# Patient Record
Sex: Male | Born: 1940 | Race: White | Hispanic: No | Marital: Married | Smoking: Former smoker
Health system: Southern US, Community
[De-identification: ages and names within clinical notes are randomized; demographics above are authoritative.]

## PROBLEM LIST (undated history)

## (undated) DIAGNOSIS — I639 Cerebral infarction, unspecified: Secondary | ICD-10-CM

## (undated) DIAGNOSIS — M545 Low back pain, unspecified: Secondary | ICD-10-CM

## (undated) DIAGNOSIS — R112 Nausea with vomiting, unspecified: Secondary | ICD-10-CM

## (undated) DIAGNOSIS — G4733 Obstructive sleep apnea (adult) (pediatric): Secondary | ICD-10-CM

## (undated) DIAGNOSIS — M1990 Unspecified osteoarthritis, unspecified site: Secondary | ICD-10-CM

## (undated) DIAGNOSIS — Z9889 Other specified postprocedural states: Secondary | ICD-10-CM

## (undated) DIAGNOSIS — Z950 Presence of cardiac pacemaker: Secondary | ICD-10-CM

## (undated) DIAGNOSIS — Z9289 Personal history of other medical treatment: Secondary | ICD-10-CM

## (undated) DIAGNOSIS — I5032 Chronic diastolic (congestive) heart failure: Secondary | ICD-10-CM

## (undated) DIAGNOSIS — F0390 Unspecified dementia without behavioral disturbance: Secondary | ICD-10-CM

## (undated) DIAGNOSIS — R0602 Shortness of breath: Secondary | ICD-10-CM

## (undated) DIAGNOSIS — G20A1 Parkinson's disease without dyskinesia, without mention of fluctuations: Secondary | ICD-10-CM

## (undated) DIAGNOSIS — E785 Hyperlipidemia, unspecified: Secondary | ICD-10-CM

## (undated) DIAGNOSIS — G8929 Other chronic pain: Secondary | ICD-10-CM

## (undated) DIAGNOSIS — I10 Essential (primary) hypertension: Secondary | ICD-10-CM

## (undated) DIAGNOSIS — Z8739 Personal history of other diseases of the musculoskeletal system and connective tissue: Secondary | ICD-10-CM

## (undated) DIAGNOSIS — E119 Type 2 diabetes mellitus without complications: Secondary | ICD-10-CM

## (undated) DIAGNOSIS — R011 Cardiac murmur, unspecified: Secondary | ICD-10-CM

## (undated) DIAGNOSIS — G4752 REM sleep behavior disorder: Secondary | ICD-10-CM

## (undated) DIAGNOSIS — Z9989 Dependence on other enabling machines and devices: Secondary | ICD-10-CM

## (undated) DIAGNOSIS — I2510 Atherosclerotic heart disease of native coronary artery without angina pectoris: Secondary | ICD-10-CM

## (undated) HISTORY — PX: FRACTURE SURGERY: SHX138

## (undated) HISTORY — PX: TONSILLECTOMY: SUR1361

## (undated) HISTORY — PX: CARDIAC CATHETERIZATION: SHX172

## (undated) HISTORY — DX: Essential (primary) hypertension: I10

## (undated) HISTORY — DX: Cerebral infarction, unspecified: I63.9

## (undated) HISTORY — PX: NASAL FRACTURE SURGERY: SHX718

## (undated) HISTORY — PX: HIP FRACTURE SURGERY: SHX118

## (undated) HISTORY — DX: Hyperlipidemia, unspecified: E78.5

## (undated) HISTORY — DX: Atherosclerotic heart disease of native coronary artery without angina pectoris: I25.10

---

## 1997-04-10 ENCOUNTER — Encounter: Payer: Self-pay | Admitting: Internal Medicine

## 1997-06-30 ENCOUNTER — Encounter: Payer: Self-pay | Admitting: Internal Medicine

## 1998-05-12 ENCOUNTER — Ambulatory Visit (HOSPITAL_COMMUNITY): Admission: RE | Admit: 1998-05-12 | Discharge: 1998-05-12 | Payer: Self-pay | Admitting: Interventional Cardiology

## 1998-05-14 ENCOUNTER — Ambulatory Visit (HOSPITAL_COMMUNITY): Admission: RE | Admit: 1998-05-14 | Discharge: 1998-05-14 | Payer: Self-pay | Admitting: Interventional Cardiology

## 1998-12-02 ENCOUNTER — Encounter (HOSPITAL_COMMUNITY): Admission: RE | Admit: 1998-12-02 | Discharge: 1999-03-02 | Payer: Self-pay | Admitting: Interventional Cardiology

## 1998-12-13 ENCOUNTER — Ambulatory Visit: Admission: RE | Admit: 1998-12-13 | Discharge: 1998-12-13 | Payer: Self-pay | Admitting: Internal Medicine

## 2000-10-07 ENCOUNTER — Ambulatory Visit (HOSPITAL_COMMUNITY): Admission: RE | Admit: 2000-10-07 | Discharge: 2000-10-07 | Payer: Self-pay | Admitting: Interventional Cardiology

## 2004-12-18 ENCOUNTER — Ambulatory Visit: Payer: Self-pay | Admitting: Internal Medicine

## 2006-06-28 ENCOUNTER — Ambulatory Visit: Payer: Self-pay | Admitting: Internal Medicine

## 2007-06-28 ENCOUNTER — Emergency Department (HOSPITAL_COMMUNITY): Admission: EM | Admit: 2007-06-28 | Discharge: 2007-06-28 | Payer: Self-pay | Admitting: Emergency Medicine

## 2007-11-06 ENCOUNTER — Telehealth: Payer: Self-pay | Admitting: Internal Medicine

## 2007-12-05 DIAGNOSIS — E66811 Obesity, class 1: Secondary | ICD-10-CM | POA: Insufficient documentation

## 2007-12-05 DIAGNOSIS — I2510 Atherosclerotic heart disease of native coronary artery without angina pectoris: Secondary | ICD-10-CM | POA: Insufficient documentation

## 2007-12-05 DIAGNOSIS — Z9989 Dependence on other enabling machines and devices: Secondary | ICD-10-CM | POA: Insufficient documentation

## 2007-12-05 DIAGNOSIS — E669 Obesity, unspecified: Secondary | ICD-10-CM | POA: Insufficient documentation

## 2007-12-05 DIAGNOSIS — G4733 Obstructive sleep apnea (adult) (pediatric): Secondary | ICD-10-CM | POA: Insufficient documentation

## 2007-12-05 DIAGNOSIS — G47419 Narcolepsy without cataplexy: Secondary | ICD-10-CM | POA: Insufficient documentation

## 2007-12-12 ENCOUNTER — Ambulatory Visit: Payer: Self-pay | Admitting: Internal Medicine

## 2007-12-13 DIAGNOSIS — I10 Essential (primary) hypertension: Secondary | ICD-10-CM | POA: Insufficient documentation

## 2007-12-13 DIAGNOSIS — E785 Hyperlipidemia, unspecified: Secondary | ICD-10-CM | POA: Insufficient documentation

## 2008-03-13 ENCOUNTER — Telehealth: Payer: Self-pay | Admitting: Internal Medicine

## 2008-07-02 ENCOUNTER — Telehealth (INDEPENDENT_AMBULATORY_CARE_PROVIDER_SITE_OTHER): Payer: Self-pay | Admitting: *Deleted

## 2008-07-15 ENCOUNTER — Encounter: Payer: Self-pay | Admitting: Internal Medicine

## 2008-07-15 ENCOUNTER — Telehealth (INDEPENDENT_AMBULATORY_CARE_PROVIDER_SITE_OTHER): Payer: Self-pay | Admitting: *Deleted

## 2008-08-23 ENCOUNTER — Telehealth (INDEPENDENT_AMBULATORY_CARE_PROVIDER_SITE_OTHER): Payer: Self-pay | Admitting: *Deleted

## 2008-09-17 ENCOUNTER — Ambulatory Visit (HOSPITAL_COMMUNITY): Admission: RE | Admit: 2008-09-17 | Discharge: 2008-09-17 | Payer: Self-pay | Admitting: Orthopedic Surgery

## 2008-11-04 ENCOUNTER — Telehealth: Payer: Self-pay | Admitting: Internal Medicine

## 2008-12-10 ENCOUNTER — Ambulatory Visit: Payer: Self-pay | Admitting: Internal Medicine

## 2009-04-04 ENCOUNTER — Telehealth: Payer: Self-pay | Admitting: Internal Medicine

## 2009-07-25 ENCOUNTER — Telehealth: Payer: Self-pay | Admitting: Internal Medicine

## 2009-07-31 ENCOUNTER — Telehealth: Payer: Self-pay | Admitting: Internal Medicine

## 2009-08-04 ENCOUNTER — Encounter: Payer: Self-pay | Admitting: Internal Medicine

## 2009-10-29 ENCOUNTER — Encounter: Payer: Self-pay | Admitting: Internal Medicine

## 2009-10-30 ENCOUNTER — Telehealth: Payer: Self-pay | Admitting: Internal Medicine

## 2009-12-10 ENCOUNTER — Ambulatory Visit: Payer: Self-pay | Admitting: Internal Medicine

## 2009-12-29 ENCOUNTER — Encounter: Payer: Self-pay | Admitting: Internal Medicine

## 2010-01-11 ENCOUNTER — Encounter: Payer: Self-pay | Admitting: Internal Medicine

## 2010-02-09 ENCOUNTER — Telehealth: Payer: Self-pay | Admitting: Internal Medicine

## 2010-05-07 ENCOUNTER — Telehealth (INDEPENDENT_AMBULATORY_CARE_PROVIDER_SITE_OTHER): Payer: Self-pay | Admitting: *Deleted

## 2010-08-12 ENCOUNTER — Telehealth: Payer: Self-pay | Admitting: Internal Medicine

## 2010-08-17 ENCOUNTER — Telehealth: Payer: Self-pay | Admitting: Internal Medicine

## 2010-08-31 ENCOUNTER — Telehealth (INDEPENDENT_AMBULATORY_CARE_PROVIDER_SITE_OTHER): Payer: Self-pay | Admitting: *Deleted

## 2010-10-09 ENCOUNTER — Telehealth (INDEPENDENT_AMBULATORY_CARE_PROVIDER_SITE_OTHER): Payer: Self-pay | Admitting: *Deleted

## 2010-10-09 ENCOUNTER — Encounter: Payer: Self-pay | Admitting: Internal Medicine

## 2010-11-05 ENCOUNTER — Telehealth (INDEPENDENT_AMBULATORY_CARE_PROVIDER_SITE_OTHER): Payer: Self-pay | Admitting: *Deleted

## 2010-11-22 ENCOUNTER — Encounter: Payer: Self-pay | Admitting: Endocrinology

## 2010-12-01 NOTE — Assessment & Plan Note (Signed)
Summary: 12 months/apc   Primary Provider/Referring Provider:  Holley Bouche  CC:  yearly follow up - no complaints.  History of Present Illness: 12/12/07- CPAP works fine but is 70 yrs old and has gotten so loud it disturbs wife as much as his snoring. He will always use it- very compliant. We discussed humidifier. Wakes occasionally sneezing- not every night. Hasn't been an allergy prone person. Decongestants help. Computer makes him sleepy- discussed Adderall, has enough pills for now,  and tolerates it very well. We again discussed medication safety, naps and sleep hygiene.  27-Dec-2008- OSA, Narcolepsy Continues CPAP with excellent compliance.  Long talk about replacement for older machine/ mask. He never filled the replacement script from last year.  Some issues with his insurance company about financing replacement.  Stays alert. Recognizes sleep hygiene - he needs to go to bed on time. Snores if mask shifts. Adderall- uses one daily, sometimes one at lunch. His cardiologist has not expressed concern.  December 10, 2009-OSA, Narcolepsy Finally got his first replacement cpap machine last week. Pressure is at 9, with nasal pillows mask through [facility]. Very compliant. He feels better rested with new machine aqnd we discussed clues to adequacy of settings.  Takes adderaal almost every morning, rarely a second at lunch. Occasional naps as needed. A cup of coffee in AM.   Medications Prior to Update: 1)  Cpap 9 Cwp .... Replacement Machine With Heated Humidifier 2)  Multivitamin .... Take 1 Tablet By Mouth Once A Day 3)  Micardis 40 Mg  Tabs (Telmisartan) .... Take 1 By Mouth Once Daily 4)  Adderall 10 Mg  Tabs (Amphetamine-Dextroamphetamine) .... Take Up To 3 Daily 5)  Bayer Aspirin Ec Low Dose 81 Mg Tbec (Aspirin) .... Take 1 By Mouth Once Daily 6)  Glucosamine-Chondroitin 1500-1200 Mg/47ml Liqd (Glucosamine-Chondroitin) .... Once Daily 7)  Chromium Picolinate 200 Mcg Caps (Chromium  Picolinate) .... Use As Directed 8)  Fish Oil 1000 Mg Caps (Omega-3 Fatty Acids) .... Take 1 By Mouth Once Daily  Current Medications (verified): 1)  Cpap 9 Cwp .... Replacement Machine With Heated Humidifier 2)  Multivitamins   Tabs (Multiple Vitamin) .... Take 1 Tablet By Mouth Once A Day 3)  Avapro 300 Mg Tabs (Irbesartan) .... Take 1 Tablet By Mouth Once A Day 4)  Adderall 10 Mg  Tabs (Amphetamine-Dextroamphetamine) .... Take Up To 3 Daily 5)  Bayer Aspirin Ec Low Dose 81 Mg Tbec (Aspirin) .... Take 1 By Mouth Once Daily 6)  Glucosamine-Chondroitin 1500-1200 Mg/64ml Liqd (Glucosamine-Chondroitin) .... Once Daily 7)  Fish Oil 1000 Mg Caps (Omega-3 Fatty Acids) .... Take 1 By Mouth Once Daily 8)  Lipitor 40 Mg Tabs (Atorvastatin Calcium) .... Take 1 Tablet By Mouth Once A Day  Allergies (verified): No Known Drug Allergies  Past History:  Past Medical History: Last updated: 27-Dec-2008 Coronary Heart Disease  - Dr. 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    Vital Signs:  Patient profile:   70 year old male Height:      68  inches Weight:      259.38 pounds BMI:     39.58 O2 Sat:      96 % on Room air Pulse rate:   63 / minute BP sitting:   138 / 82  (right arm) Cuff size:   regular  Vitals Entered By: Boone Master CNA (December 10, 2009 9:08 AM)  O2 Flow:  Room air CC: yearly follow up - no complaints Is Patient Diabetic? Yes Comments Medications reviewed with patient Daytime contact number verified with patient. Boone Master CNA  December 10, 2009 9:10 AM    Physical Exam  Additional Exam:  General: A/Ox3; pleasant and cooperative, NAD, overweight SKIN: no rash, lesions NODES: no lymphadenopathy HEENT: [place]/AT, EOM- WNL, Conjuctivae- clear, PERRLA, TM-WNL, Nose- clear, Throat- clear and wnl, Melampatti III-IV NECK: Supple w/ fair ROM, JVD- none, normal carotid impulses w/o bruits Thyroid-  CHEST: Clear to P&A HEART: RRR, no m/g/r heard ABDOMEN: Soft and nl;  ZOX:WRUE, nl pulses, no edema  NEURO: Grossly intact to observation      Impression & Recommendations:  Problem # 1:  OBSTRUCTIVE SLEEP APNEA (ICD-327.23)  Fully compliant and good control with his CPAP set at 9  Problem # 2:  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  Medications Added to Medication List This Visit: 1)  Cpap 9 Cwp [facility]  2)  Multivitamins Tabs (Multiple vitamin) .... Take 1 tablet by mouth once a day 3)  Avapro 300 Mg Tabs (Irbesartan) .... Take 1 tablet by mouth once a day 4)  Lipitor 40 Mg Tabs (Atorvastatin calcium) .... Take 1 tablet by mouth once a day  Other Orders: Est. Patient Level III (45409)  Patient Instructions: 1)  Schedule return in one year, earlier if needed 2)  Continue CPAP at 9   Immunization History:  Influenza Immunization History:    Influenza:  historical (01/30/2009)  Pneumovax Immunization History:    Pneumovax:  historical (11/02/2007)

## 2010-12-01 NOTE — Medication Information (Signed)
Summary: Tax adviser   Imported By: Valinda Hoar 10/09/2010 15:34:39  _____________________________________________________________________  External Attachment:    Type:   Image     Comment:   External Document

## 2010-12-01 NOTE — Progress Notes (Signed)
Summary: adderral sub?  Phone Note Call from Patient   Caller: Patient Call For: young Summary of Call: [facility] to [facility] for adderal before year's end. pt wants to know if cy recs a sub. pt understands that this may not be addressed until tomorrow. this is fine with pt. pt # [phone] Initial call taken by: Tivis Ringer, CNA,  August 31, 2010 5:10 PM  Follow-up for Phone Call        called spoke with patient who states that he received a letter from [facility] stating that adderral is on back order until the end of the year.  pt is requesting a sub: [facility].  [facility], was told that indeed, adderral is on backorder from the menufacturer until the year's end.  please advise of a sub, thanks! patient aware message will be addressed tomorrow. nkda.  Follow-up by: Boone Master CNA/MA,  August 31, 2010 5:36 PM  Additional Follow-up for Phone Call Additional follow up Details #1::        Per CDY-Adderall 10mg  1-3 daily; [facility]  if not what do they have for a 90 day supply? Reynaldo Minium CMA  September 01, 2010 9:21 AM   [facility]mg  tablets and that is all. Please advise. Carron Curie CMA  September 01, 2010 9:25 AM     Additional Follow-up for Phone Call Additional follow up Details #2::    i have written script for # 100 Adderall 5 mg. Tell him that's what exists there and he will need to make it last.  Follow-up by: Waymon Budge MD,  September 01, 2010 9:35 AM  Additional Follow-up for Phone Call Additional follow up Details #3:: Details for Additional Follow-up Action Taken: Spoke with patient-aware that RX is ready for pick up at front desk and will need to make the RX last as the med is on back order.Reynaldo Minium CMA  September 01, 2010 9:58 AM   New/Updated Medications: ADDERALL 5 MG TABS (AMPHETAMINE-DEXTROAMPHETAMINE) 1-3 three times a day as  needed Prescriptions: ADDERALL 5 MG TABS (AMPHETAMINE-DEXTROAMPHETAMINE) 1-3 three times a day as needed  #100 x 0   Entered by:   Waymon Budge MD   Authorized by:   Pulmonary Triage   Signed by:   Waymon Budge MD on 09/01/2010   Method used:   Print then Give to Patient   RxID:   [id number]

## 2010-12-01 NOTE — Letter (Signed)
Summary: CMN for CPAP Supplies / [facility]  CMN for CPAP Supplies / [employer]   Imported By: Lennie Odor 11/06/2009 14:34:28  _____________________________________________________________________  External Attachment:    Type:   Image     Comment:   External Document

## 2010-12-01 NOTE — Letter (Signed)
Summary: CMN for CPAP Supplies/[facility]  CMN for CPAP Supplies/[facility]   Imported By: Sherian Rein 01/14/2010 13:48:25  _____________________________________________________________________  External Attachment:    Type:   Image     Comment:   External Document

## 2010-12-01 NOTE — Progress Notes (Signed)
Summary: prescript for adderall  Phone Note Call from Patient Call back at [phone]   Caller: Patient Call For: young Summary of Call: need amthetamine salts prescript will pick up when ready. Initial call taken by: Rickard Patience,  August 12, 2010 8:32 AM  Follow-up for Phone Call        pt last seen by CY 12/10/2009. Pt's yearly f/u scheduled for 12/10/10.  Pt last had Adderall rx 05/07/2010 for # 90 x 0 refills. printed rx and put on CY's cart for him to sign.  Aundra Millet Reynolds LPN  August 12, 2010 8:40 AM   Additional Follow-up for Phone Call Additional follow up Details #1::        Rx is at front desk for pick up.Reynaldo Minium CMA  August 12, 2010 10:42 AM   pt advised rx ready for pick-up. Carron Curie CMA  August 12, 2010 11:01 AM     Prescriptions: ADDERALL 10 MG  TABS (AMPHETAMINE-DEXTROAMPHETAMINE) take up to 3 daily  #90 x 0   Entered by:   Arman Filter LPN   Authorized by:   Waymon Budge MD   Signed by:   Arman Filter LPN on 04/54/0981   Method used:   Print then Give to Patient   RxID:   [id number]

## 2010-12-01 NOTE — Progress Notes (Signed)
Summary: PRESCRIPT  Phone Note Call from Patient   Caller: Patient Call For: Jordan [name] Summary of Call: NEED ANPHETAMINE SALTS PRESCRIPTS . PT WILL PICK UP Initial call taken by: Rickard Patience,  February 09, 2010 10:10 AM  Follow-up for Phone Call        Rx printed and placed on CY's cart to be signed.  Gweneth Dimitri RN  February 09, 2010 10:13 AM   Additional Follow-up for Phone Call Additional follow up Details #1::        Please let pt know that RX is signed and at front desk ready for pick up .Reynaldo Minium CMA  February 09, 2010 12:21 PM   pt aware. Carron Curie CMA  February 09, 2010 12:35 PM     New/Updated Medications: ADDERALL 10 MG  TABS (AMPHETAMINE-DEXTROAMPHETAMINE) take up to 3 daily Prescriptions: ADDERALL 10 MG  TABS (AMPHETAMINE-DEXTROAMPHETAMINE) take up to 3 daily  #90 x 0   Entered by:   Gweneth Dimitri RN   Authorized by:   Waymon Budge MD   Signed by:   Gweneth Dimitri RN on 02/09/2010   Method used:   Print then Give to Patient   RxID:   [id number]

## 2010-12-01 NOTE — Progress Notes (Signed)
Summary: rx  Phone Note Call from Patient Call back at Home Phone [phone]   Caller: Patient Call For: young Reason for Call: Talk to Nurse Summary of Call: rx for Amphetamine Salts - pt will pick up rx Initial call taken by: Eugene Gavia,  May 07, 2010 1:45 PM  Follow-up for Phone Call        Rx printed and placed on CY's cart to sign.  Will forward message to him as FYI.  Gweneth Dimitri RN  May 07, 2010 2:27 PM   Additional Follow-up for Phone Call Additional follow up Details #1::        Done Additional Follow-up by: Waymon Budge MD,  May 07, 2010 5:45 PM    Additional Follow-up for Phone Call Additional follow up Details #2::    Left message at given number that message is done and Rx at front for pick up .Reynaldo Minium CMA  May 08, 2010 9:46 AM   Prescriptions: ADDERALL 10 MG  TABS (AMPHETAMINE-DEXTROAMPHETAMINE) take up to 3 daily  #90 x 0   Entered by:   Gweneth Dimitri RN   Authorized by:   Waymon Budge MD   Signed by:   Gweneth Dimitri RN on 05/07/2010   Method used:   Print then Give to Patient   RxID:   [id number]

## 2010-12-01 NOTE — Progress Notes (Signed)
Summary: PA for amphetamine salts  Phone Note Outgoing Call Call back at [phone]- [facility]   Call placed by: Vernie Murders,  October 09, 2010 11:48 AM Call placed to: Insurer Summary of Call: PA for amphetamine salts 5 mg was recieved [facility].  Called to initiate PA.  Was able to get med approved x 1 yr.  [facility] so that [facility] can [facility].  Initial call taken by: Vernie Murders,  October 09, 2010 11:57 AM

## 2010-12-01 NOTE — Letter (Signed)
Summary: CMN/[facility]  CMN/[facility]   Imported By: Lester Lakeview 01/01/2010 10:13:50  _____________________________________________________________________  External Attachment:    Type:   Image     Comment:   External Document

## 2010-12-01 NOTE — Progress Notes (Signed)
Summary: prescript for adderall-LMTCBx1   Phone Note Call from Patient   Caller: Patient Call For: young Summary of Call: pharmacy unable to get adderrall 10mg    pt will pick up rx when ready Initial call taken by: Rickard Patience,  August 17, 2010 2:43 PM  Follow-up for Phone Call        called and spoke with pt.  pt states his pharmacy unable to fill Adderall 10mg  tabs as it is back ordered by the manufacture.  Please advise.  Thanks.  Arman Filter LPN  August 17, 2010 3:13 PM  Follow-up by: Waymon Budge MD,  August 17, 2010 8:49 PM  Additional Follow-up for Phone Call Additional follow up Details #1::        Can they get him twice the number of Adderall 5 mg? Additional Follow-up by: Waymon Budge MD,  August 17, 2010 8:50 PM    Additional Follow-up for Phone Call Additional follow up Details #2::    I spoke to pharmacists and she states there is no Adderall 5mg  or 10mg  that they can find available in [place], no form of adderall in those strengths.  They have a limited supply of adderall 30mg  only.  Please advise. Carron Curie CMA  August 18, 2010 9:31 AM  LMTCBx1 to see if pt has a mail order that he uses. If so then we can check with them to see if they have adderall.Carron Curie CMA  August 18, 2010 9:40 AM  Pt states that he has [facility] through his insurance, so I will try to call them to see if they have a supply of adderall.   Additional Follow-up for Phone Call Additional follow up Details #3:: Details for Additional Follow-up Action Taken: called and spoke with [facility] at [phone].  Was informed by rep. that they do have adderall 5mg  and 10mg  in stock.  Will need to send a written rx with pt's name, dob, and address to [facility] [address] [place], [place] [address] [facility] for pt TCB to inform him of the above info and see if he is ok with sending rx to [facility].  Arman Filter LPN  August 18, 2010 4:19 PM    lmomtcb Randell Loop [facility]  August 19, 2010 3:40 PM   rx has been printed out and [facility] to [facility]---pt is aware of this Randell Loop [facility]  August 19, 2010 4:34 PM   Prescriptions: ADDERALL 10 MG  TABS (AMPHETAMINE-DEXTROAMPHETAMINE) take up to 3 daily  #270 x 0   Entered by:   Randell Loop CMA   Authorized by:   Waymon Budge MD   Signed by:   Randell Loop CMA on 08/19/2010   Method used:   Print then Give to Patient   RxID:   [id number]

## 2010-12-03 NOTE — Progress Notes (Signed)
Summary: prescription  Phone Note Call from Patient Call back at Home Phone [phone]   Caller: Mom Call For: young Reason for Call: Refill Medication Summary of Call: Requests written prescription for amphetamine salts will pick up. Initial call taken by: Darletta Moll,  November 05, 2010 11:25 AM  Follow-up for Phone Call        rx printed and placed on CY look-at to sign. Last filled  on 08-18-10. Pt will pick-up once ready. Carron Curie CMA  November 05, 2010 12:29 PM    Rx is signed and at front desk for pick up-left message on home number that message is complete and can be picked up.Reynaldo Minium CMA  November 05, 2010 2:48 PM     Prescriptions: ADDERALL 10 MG  TABS (AMPHETAMINE-DEXTROAMPHETAMINE) take up to 3 daily  #270 x 0   Entered by:   Carron Curie CMA   Authorized by:   Waymon Budge MD   Signed by:   Carron Curie CMA on 11/05/2010   Method used:   Print then Give to Patient   RxID:   [id number]

## 2010-12-10 ENCOUNTER — Ambulatory Visit: Payer: Self-pay | Admitting: Internal Medicine

## 2010-12-17 ENCOUNTER — Ambulatory Visit (INDEPENDENT_AMBULATORY_CARE_PROVIDER_SITE_OTHER): Payer: Medicare Other | Admitting: Internal Medicine

## 2010-12-17 ENCOUNTER — Encounter: Payer: Self-pay | Admitting: Internal Medicine

## 2010-12-17 DIAGNOSIS — G47429 Narcolepsy in conditions classified elsewhere without cataplexy: Secondary | ICD-10-CM

## 2010-12-17 DIAGNOSIS — G4733 Obstructive sleep apnea (adult) (pediatric): Secondary | ICD-10-CM

## 2010-12-23 NOTE — Assessment & Plan Note (Signed)
Summary: 12 mth//apc   Primary Provider/Referring Provider:  Holley Bouche  CC:  Yearly follow up visit-OSA; uses CPAP each night and no complaints..  History of Present Illness:  12/10/08- OSA, Narcolepsy Continues CPAP with excellent compliance.  Long talk about replacement for older machine/ mask. He never filled the replacement script from last year.  Some issues with his insurance company about financing replacement.  Stays alert. Recognizes sleep hygiene - he needs to go to bed on time. Snores if mask shifts. Adderall- uses one daily, sometimes one at lunch. His cardiologist has not expressed concern.  December 10, 2009-OSA, Narcolepsy Finally got his first replacement cpap machine last week. Pressure is at 9, with nasal pillows mask through [facility]. Very compliant. He feels better rested with new machine aqnd we discussed clues to adequacy of settings.  Takes adderal almost every morning, rarely a second at lunch. Occasional naps as needed. A cup of coffee in AM.  Jan 12, 2011- OSA, Narcolepsy, CAD- Dr Katrinka Blazing Nurse-CC: Yearly follow up visit-OSA; uses CPAP each night and no complaints. CPAP 9 - doing well every night. Even takes it on scout trips with long extension cord. Adderall- typically 1 ten mg tab in AM, usually another later in day. Discussed reports of little cardiac / BP complication from these meds. 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 . He has started tai kwan do.    Preventive Screening-Counseling & Management  Alcohol-Tobacco     Smoking Status: quit     Packs/Day: 2.5     Year Started: 20's     Year Quit: 1968  Current Medications (verified): 1)  Cpap 9 Cwp [facility] 2)  Multivitamins   Tabs (Multiple Vitamin) .... Take 1 Tablet By Mouth Once A Day 3)  Lisinopril 10 Mg Tabs (Lisinopril) .... Take 1 By Mouth Once Daily 4)  Adderall 10 Mg  Tabs (Amphetamine-Dextroamphetamine) .... Take Up To 3  Daily 5)  Bayer Aspirin Ec Low Dose 81 Mg Tbec (Aspirin) .... Take 1 By Mouth Once Daily 6)  Glucosamine-Chondroitin 1500-1200 Mg/49ml Liqd (Glucosamine-Chondroitin) .... Once Daily 7)  Fish Oil 1000 Mg Caps (Omega-3 Fatty Acids) .... Take 1 By Mouth Once Daily 8)  Lipitor 40 Mg Tabs (Atorvastatin Calcium) .... Take 1 Tablet By Mouth Once A Day  Allergies (verified): No Known Drug Allergies  Past History:  Past Medical History: Last updated: 12/10/2008 Coronary Heart Disease  - Dr. 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    Vital Signs:  Patient profile:   70 year old male Height:  68 inches Weight:      251.13 pounds BMI:     38.32 O2 Sat:      97 % on Room air Pulse rate:   65 / minute BP sitting:   114 / 68  (left arm) Cuff size:   large  Vitals Entered By: Reynaldo Minium CMA (December 17, 2010 10:07 AM)  O2 Flow:  Room air  Physical Exam  Additional Exam:  General: A/Ox3; pleasant and  cooperative, NAD, overweight SKIN: no rash, lesions NODES: no lymphadenopathy HEENT: [place]/AT, EOM- WNL, Conjuctivae- clear, PERRLA, TM-WNL, Nose- clear, Throat- clear and wnl, Mallampati III-IV NECK: Supple w/ fair ROM, JVD- none, normal carotid impulses w/o bruits Thyroid-  CHEST: Clear to P&A HEART: RRR, no m/g/r heard ABDOMEN: Soft and nl;  ZOX:WRUE, nl pulses, no edema  NEURO: Grossly intact to observation, very alert and pleasantly articulate      Impression & Recommendations:  Problem # 1:  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 His cardiac status is not seemingly affected by his adderall. Sleep hygiene is good.   Problem # 2:  OBSTRUCTIVE SLEEP APNEA (ICD-327.23)  Great compliance and control. Doing well with CPAP.   Medications Added to Medication List This Visit: 1)  Lisinopril 10 Mg Tabs (Lisinopril) .... Take 1 by mouth once daily  Other Orders: Est. Patient Level III (45409)  Patient Instructions: 1)  Please schedule a follow-up appointment in 1 year. 2)  Continue CPAP at 9 3)  continue Adderall as you are doing. Please call as needed.

## 2011-03-19 NOTE — Cardiovascular Report (Signed)
[place]. [facility]  Patient:    Jordan [name], Jordan [name]                      MRN: [id number] Proc. Date: 10/07/00 Adm. Date:  [id number] Disc. Date: [id number] Attending:  Lyn Records. Iii CC:         Alphonzo Dublin, M.D.  Clinton D. Maple Hudson, M.D.   Cardiac Catheterization  INDICATION:  Exertional dyspnea and chest discomfort, early positive exercise treadmill test with exercise-induced hypotension and marked ischemic ST-T wave change.  PROCEDURES PERFORMED: 1. Left heart catheterization. 2. Selective coronary angioplasty. 3. Left ventriculography. 4. Perclose arteriotomy closure.  CARDIOLOGIST:  Darci Needle III, M.D.  DESCRIPTION:  After informed consent, a 6-French sheath was inserted into the right femoral artery using the modified Seldinger technique.  A 6-French A2 multipurpose catheter was used for hemodynamic recordings, left ventriculography by hand injection, and selective attempts at left and right coronary angiography.  The #4 6-French left Judkins catheter and right Judkins catheter were used for  left and right coronary angiography.  The patient tolerated the diagnostic catheterization without problems.  Perclose was used for arteriotomy closure without complications.  RESULTS: I.   Hemodynamic data:      a. Aortic pressure 133/79 mmHg.      b. Left ventricular pressure 135/25 mmHg. II.  Left ventriculography:  The left ventricle is normal in size and      demonstrates overall normal contractility.  EF is 60%.  No mitral      regurgitation is noted. III. Selective coronary angiography.      a. Left main coronary: Heavily calcified.  A 20 to 30% narrowing is         noted.  This is noted in the distal portion of the left main before         it enters the LAD, ramus, and circumflex trifurcation.      b. Left anterior descending coronary: The LAD is heavily calcified in         its proximal segment.  It gives origin to several  moderate size         septal perforator branches.  Distal to the second diagonal branch,         there is an eccentric 85% stenosis in the LAD near the apex.  The         vessel in this region is no greater than 2.25 mm in diameter.      c. Circumflex artery: The circumflex artery gives origin to one branching         obtuse marginal.  The origin of the circumflex appears diseased and is         narrowed up to 50%.      d. Ramus intermedius branch: The ramus intermedius branch is widely         patent, containing no significant obstruction.      e. Right coronary: The right coronary artery is large and arises with a         shepherds crook and contains no significant obstruction.  A large PDA         and left ventricular branches arise from the distal right coronary.  CONCLUSIONS: 1. High-grade stenosis in the distal left anterior descending artery causing    apical ischemia on the Cardiolite scan. The ostial circumflex lesion is    moderate but certainly not severe.  The right coronary and proximal left  anterior descending artery are free of any significant obstruction. 2. Normal left ventricular function. 3. The abnormalities on the electrocardiogram and hemodynamic response to    exercise are out of proportion to the abnormalities found on this scan.    Other explanations for this dramatic hemodynamic response should be    considered including occult pulmonary hypertension which, again, was not    evaluated for by hemodynamic recordings.  PLAN:  The patient will consider obtaining a second opinion consultation from another cardiologist here in [place] or from the [facility] versus going ahead with interventional treatment of the distal LAD to see if this will relieve his symptoms.  At that time, we may consider doing intravascular ultrasound evaluation of the ostial circumflex and also formal right heart catheterization to document pulmonary artery pressures. DD:   10/07/00 TD:  10/08/00 Job: [id number]

## 2011-03-19 NOTE — Assessment & Plan Note (Signed)
[facility]                               PULMONARY OFFICE NOTE   KASSIM, GUERTIN                      MRN:          [id number]  DATE:06/28/2006                            DOB:          April 05, 1941    PROBLEM LIST:  1. Obstructive sleep apnea.  2. Narcolepsy.  3. Coronary artery disease.  4. Obesity.   HISTORY OF PRESENT ILLNESS:  This gentleman with two separately identified  sleep disorders continues feeling fairly stable since his last visit in  February.  He is using CPAP at 9 CWP every night.  His wife tells him  sometimes he snores through it a little bit but apparently not much.  He is  comfortable with Adderall finding that he needs two a day on most days for  best control.  His need has not really changed but trying to make 50 tablets  last one month has never been quite enough for comfortable best function.  He has had no symptoms of over stimulation including increased chest pain or  palpitation.  We reviewed the controlled status of Adderall  potential for  dependence tolerance and withdrawal again and acts a potential adverse  effect as a stimulant given his background of coronary artery disease.  He  is usually using 10 mg once or twice a day, and we are going to write  prescriptions so that he could use up to three tablets a day if necessary.   MEDICATIONS:  Lipitor 40 mg, Adderall 10 mg, 0 to 3 times daily, Imdur 30  mg, Toprol XL 25 mg, CPAP at 9 CWP.   ALLERGIES:  No medication allergy.   OBJECTIVE:  Weight 265 pounds which is up 9 pounds since February.  He is  alert.  There are no pressure marks on his face from the mask and no evident  nasal congestion.  Pulse is regular without murmur heard.  Breathing is  unlabored.  There is no tremor.  Palms are dry.   IMPRESSION:  1. Obstructive sleep apnea medically controlled on CPAP at 9 CWP.  2. Narcolepsy controlled with naps, sleep hygiene and Adderall.[name]   PLAN:  1. I  emphasized again the importance of weight loss and reminded him of      his responsibility to drive safely.  2. Refill Adderall 10 mg to take up to three in one day if really needed      but do not expect more than one or two as a routine.  3. Saline nasal gel if needed for excessive dryness.  4. Scheduled to return in one year, earlier p.r.n.                                   Clinton D. Maple Hudson, MD, FCCP, FACP   CDY/MedQ  DD:  06/29/2006  DT:  06/30/2006  Job #:  [id number]   cc:   Gregary Signs A. Everardo All, MD  Lyn Records, MD

## 2011-03-19 NOTE — H&P (Signed)
[place]  Patient:    Jordan [name], Jordan [name]                      MRN: [id number] Adm. Date:  [id number] Disc. Date: [id number] Attending:  Lyn Records. Iii Dictator:   Anselm Lis, N.P. CC:         Willis Modena. Dreiling, M.D.  Clinton D. Maple Hudson, M.D.   History and Physical  DATE OF BIRTH:  08-13-1941  PRIMARY CARE [place] Azure:  Dr. Amada Jupiter Dreiling.  SUBJECTIVE:  [name] is a very pleasant 70 year old male who has been experiencing continuation of exertional fatigue and chest discomfort when walking.  Interestingly, he will not experience this when he is working out quite vigorously on an exercise bike or rowing machine, but specifically with walking quickly, particularly on a treadmill.  He is status post a cardiac catheterization two years earlier which revealed moderate CAD with 50-70% ostial circumflex, 50-70% distal LAD, and a proximal 20% distal left main.  He had normal LV function.  A recent stress Cardiolite September 2001 demonstrated apical ischemia with ischemic EKG changes anterior and inferiorly, and positive anginal symptoms precipitated.  There was also a poor prognostic feature of hypotension during exercise.  CARDIAC RISK FACTORS:  Male sex, age.  PREVIOUS MEDICAL HISTORY: 1. Coronary atherosclerotic heart disease:    (July 1999) Cardiac catheterization revealing normal LV function with    50-70% ostial circumflex, 50-70% distal LAD, and 20% distal left main.    No right coronary artery disease.  A follow-up 2-D echo revealed normal    right heart with mild TR, mild MR.  EF was 60-70%. 2. Narcolepsy for which he take Provigil. 3. Tonsillectomy. 4. History of broken nose. 5. History of dislocated left shoulder in 1986 and right shoulder in 1966. 6. History of obstructive sleep apnea for which he uses nocturnal CPAP.  Denies history of diabetes mellitus, cancer, nor asthma or arthritis.  ALLERGIES:  No known drug allergies;  okay with seafood, shellfish, and iodinated products.  MEDICATIONS: 1. Provigil 200 mg p.o. q.d. 2. Aspirin 325 mg daily. 3. Lipitor 10 mg p.o. q.d.  SOCIAL AND HABITS:  Tobacco use:  Two to two-and-a-half packs per day for four years but quit in 1968.  ETOH:  Social.  He works in Systems developer.  Has been married for six years, has three children - an 85-month-old from this marriage and two children from prior marriage ages 58 and 25.  FAMILY HISTORY:  Father had hypertension and died at age 40.  Mother died age 62.  A 53 year old brother died in airplane crash.  He has a sister who is obese but other particulars of her health history is unknown.  PHYSICAL EXAMINATION:  VITAL SIGNS:  Blood pressure 126/79 with heart rate 65 and regular, respiratory rate 16, temperature 98.2.  His height is 5 feet 9 inches and he weighs 239 pounds.  GENERAL:  He is an overweight 70 year old gentleman in no acute distress.  He is alert and pleasantly conversant.  His wife and friend accompany him today.  HEENT:  Brisk bilateral carotid upstroke without bruit.  NECK:  No significant JVD, no thyromegaly.  CARDIAC:  Regular rate and rhythm with a very soft systolic murmur appreciated right upper and left upper sternal border.  Negative rub nor gallop.  CHEST:  Bibasilar crackles; no CVA tenderness.  ABDOMEN:  Soft, obese, normoactive bowel sounds.  Negative abdominal aortic, renal, nor  femoral bruit.  Nontender to applied pressure.  Negative masses nor organomegaly appreciated.  EXTREMITIES:  Distal pulses intact; negative pedal edema.  NEUROLOGIC:  Cranial nerves 2-12 are grossly intact.   Alert and oriented x 3.  GENITAL/RECTAL:  Deferred.  LABORATORY TESTS AND DATA:  EKG from September 2001 reveals NSR with early R wave progression.  No ischemic changes.  No significant change in EKG in 1999.  Chest x-ray from October 05, 2000 revealed tortuous aorta and slight prominence of the right  hilum.  No change from 1999.  IMPRESSION: 1. Abnormal stress test revealing apical ischemia with preserved left    ventricular function with ejection fraction of 76%.  Treadmill test    significant for ischemic inferior and lateral EKG changes with    hypotension at peak stress and positive symptoms of angina precipitated.    Patient has known 50-70% ostial circumflex and 50-70% distal LAD, as well    as 20% distal left main from coronary angiography July 1999. 2. History of narcolepsy, for which he takes Provigil. 3. Obstructive sleep apnea; nocturnal CPAP.  PLAN:  Patient has been counseled to undergo and has accepted plans for coronary angiography if possible, percutaneous intervention if indicated and able.  Risks, potential complications, benefits, and alternatives procedures discussed in detail.  Mr. Valdes indicates his questions and concerns have been addressed and agreeable to proceed. DD:  10/07/00 TD:  10/08/00 Job: [id number]

## 2011-08-13 LAB — CBC
HCT: 41.4
Hemoglobin: 14.3
MCHC: 34.6

## 2011-08-13 LAB — POCT CARDIAC MARKERS
Myoglobin, poc: 88.8
Operator id: 1627
Operator id: 4001

## 2011-08-13 LAB — BASIC METABOLIC PANEL
BUN: 14
Chloride: 107
GFR calc Af Amer: >60
Glucose, Bld: 128 — ABNORMAL HIGH

## 2011-08-13 LAB — DIFFERENTIAL
Basophils Absolute: 0
Eosinophils Absolute: 0.2
Eosinophils Relative: 2
Lymphocytes Relative: 36
Lymphs Abs: 2.6
Monocytes Absolute: 0.8 — ABNORMAL HIGH
Neutro Abs: 3.7
Neutrophils Relative %: 51

## 2011-08-27 ENCOUNTER — Telehealth: Payer: Self-pay | Admitting: Internal Medicine

## 2011-08-27 MED ORDER — AMPHETAMINE-DEXTROAMPHETAMINE 10 MG PO TABS: ORAL_TABLET | ORAL | Status: DC

## 2011-08-27 NOTE — Telephone Encounter (Signed)
Pt returning Triage's call & can be reached at [phone].  Jordan [name]

## 2011-08-27 NOTE — Telephone Encounter (Signed)
I called pt at [phone] - lmomtcb

## 2011-08-27 NOTE — Telephone Encounter (Signed)
Pt called back and stated that he takes the adderall 10mg    1-3 tablets daily.  He has enough to get by till early next week.  Is requesting that this rx be mailed to him. r x has been printed and placed on CY  Cart for signature.

## 2011-08-27 NOTE — Telephone Encounter (Signed)
Last OV with CDY 12/17/10 and was told to f/u in 1 year.  Pt has pending OV with CDY on 12/21/11.    lmomtcb to verify med and strength.

## 2011-08-31 NOTE — Telephone Encounter (Signed)
Jordan [name] has this rx been mailed? If so then just close message. Thanks.  Carron Curie, CMA

## 2011-11-05 DIAGNOSIS — M999 Biomechanical lesion, unspecified: Secondary | ICD-10-CM | POA: Diagnosis not present

## 2011-11-05 DIAGNOSIS — M5137 Other intervertebral disc degeneration, lumbosacral region: Secondary | ICD-10-CM | POA: Diagnosis not present

## 2011-11-19 DIAGNOSIS — M5137 Other intervertebral disc degeneration, lumbosacral region: Secondary | ICD-10-CM | POA: Diagnosis not present

## 2011-11-19 DIAGNOSIS — M999 Biomechanical lesion, unspecified: Secondary | ICD-10-CM | POA: Diagnosis not present

## 2011-11-24 DIAGNOSIS — M999 Biomechanical lesion, unspecified: Secondary | ICD-10-CM | POA: Diagnosis not present

## 2011-11-24 DIAGNOSIS — M5137 Other intervertebral disc degeneration, lumbosacral region: Secondary | ICD-10-CM | POA: Diagnosis not present

## 2011-12-01 DIAGNOSIS — H40019 Open angle with borderline findings, low risk, unspecified eye: Secondary | ICD-10-CM | POA: Diagnosis not present

## 2011-12-03 DIAGNOSIS — M5137 Other intervertebral disc degeneration, lumbosacral region: Secondary | ICD-10-CM | POA: Diagnosis not present

## 2011-12-03 DIAGNOSIS — M999 Biomechanical lesion, unspecified: Secondary | ICD-10-CM | POA: Diagnosis not present

## 2011-12-09 DIAGNOSIS — M5137 Other intervertebral disc degeneration, lumbosacral region: Secondary | ICD-10-CM | POA: Diagnosis not present

## 2011-12-09 DIAGNOSIS — M999 Biomechanical lesion, unspecified: Secondary | ICD-10-CM | POA: Diagnosis not present

## 2011-12-16 DIAGNOSIS — M5137 Other intervertebral disc degeneration, lumbosacral region: Secondary | ICD-10-CM | POA: Diagnosis not present

## 2011-12-16 DIAGNOSIS — M999 Biomechanical lesion, unspecified: Secondary | ICD-10-CM | POA: Diagnosis not present

## 2011-12-17 DIAGNOSIS — I2510 Atherosclerotic heart disease of native coronary artery without angina pectoris: Secondary | ICD-10-CM | POA: Diagnosis not present

## 2011-12-17 DIAGNOSIS — E785 Hyperlipidemia, unspecified: Secondary | ICD-10-CM | POA: Diagnosis not present

## 2011-12-17 DIAGNOSIS — I453 Trifascicular block: Secondary | ICD-10-CM | POA: Diagnosis not present

## 2011-12-17 DIAGNOSIS — I10 Essential (primary) hypertension: Secondary | ICD-10-CM | POA: Diagnosis not present

## 2011-12-17 DIAGNOSIS — I359 Nonrheumatic aortic valve disorder, unspecified: Secondary | ICD-10-CM | POA: Diagnosis not present

## 2011-12-21 ENCOUNTER — Ambulatory Visit: Payer: Self-pay | Admitting: Internal Medicine

## 2011-12-22 ENCOUNTER — Encounter: Payer: Self-pay | Admitting: Internal Medicine

## 2011-12-23 ENCOUNTER — Ambulatory Visit (INDEPENDENT_AMBULATORY_CARE_PROVIDER_SITE_OTHER): Payer: BC Managed Care – PPO | Admitting: Internal Medicine

## 2011-12-23 ENCOUNTER — Encounter: Payer: Self-pay | Admitting: Internal Medicine

## 2011-12-23 VITALS — BP 128/74 | HR 75 | Ht 68.0 in | Wt 249.4 lb

## 2011-12-23 DIAGNOSIS — G4733 Obstructive sleep apnea (adult) (pediatric): Secondary | ICD-10-CM

## 2011-12-23 DIAGNOSIS — G47429 Narcolepsy in conditions classified elsewhere without cataplexy: Secondary | ICD-10-CM

## 2011-12-23 MED ORDER — AMPHETAMINE-DEXTROAMPHETAMINE 10 MG PO TABS: ORAL_TABLET | ORAL | Status: DC

## 2011-12-23 NOTE — Progress Notes (Signed)
12/23/11- 56 yoM former smoker followed for OSA, Narcolepsy w/o cataplexy, complicated by CAD, HBP.  VN Vet. LOV-12/17/10 Since last here he has used CPAP all night every night and would consider missing it. Pressure remains at 9/[facility]. He is comfortable with a nasal pillows mask. He feels that he sleeps well at night. Rarely needs a nap. Continues to use 110 mg at around tablet each morning and occasionally one later in the day. Denies palpitation, chest pain or mood changes with this medication. We discussed controlled status, access and alternatives.  ROS-see HPI Constitutional:   No-   weight loss, night sweats, fevers, chills, fatigue, lassitude. HEENT:   No-  headaches, difficulty swallowing, tooth/dental problems, sore throat,       No-  sneezing, itching, ear ache, nasal congestion, post nasal drip,  CV:  No-   chest pain, orthopnea, PND, swelling in lower extremities, anasarca, dizziness, palpitations Resp: No-   shortness of breath with exertion or at rest.              No-   productive cough,  No non-productive cough,  No- coughing up of blood.              No-   change in color of mucus.  No- wheezing.   Skin: No-   rash or lesions. GI:  No-   heartburn, indigestion, abdominal pain, nausea, vomiting, diarrhea,                 change in bowel habits, loss of appetite GU:  MS:  No-   joint pain or swelling.  No- decreased range of motion.  No- back pain. Neuro-     nothing unusual Psych:  No- change in mood or affect. No depression or anxiety.  No memory loss.  OBJ- Physical Exam General- Alert, Oriented, Affect-appropriate, Distress- none acute Skin- rash-none, lesions- none, excoriation- none Lymphadenopathy- none Head- atraumatic            Eyes- Gross vision intact, PERRLA, conjunctivae and secretions clear            Ears- Hearing, canals-normal            Nose- Clear, no-Septal dev, mucus, polyps, erosion, perforation             Throat- Mallampati II , mucosa clear ,  drainage- none, tonsils- atrophic Neck- flexible , trachea midline, no stridor , thyroid nl, carotid no bruit Chest - symmetrical excursion , unlabored           Heart/CV- RRR , no murmur , no gallop  , no rub, nl s1 s2                           - JVD- none , edema- none, stasis changes- none, varices- none           Lung- clear to P&A, wheeze- none, cough- none , dullness-none, rub- none           Chest wall-  Abd- Br/ Gen/ Rectal- Not done, not indicated Extrem- cyanosis- none, clubbing, none, atrophy- none, strength- nl Neuro- grossly intact to observation

## 2011-12-23 NOTE — Patient Instructions (Signed)
Refill script for Adderall  Continue CPAP 9/ Christ[facility] Allegra

## 2011-12-26 NOTE — Assessment & Plan Note (Signed)
Continued good compliance and control with CPAP. We reviewed options. No changes are needed.

## 2011-12-26 NOTE — Assessment & Plan Note (Signed)
Stable, appropriate use of Adderall. We have seen no indication that it is interfering with his coronary disease but that issue has been reviewed with him.

## 2011-12-29 DIAGNOSIS — I2510 Atherosclerotic heart disease of native coronary artery without angina pectoris: Secondary | ICD-10-CM | POA: Diagnosis not present

## 2011-12-29 DIAGNOSIS — I10 Essential (primary) hypertension: Secondary | ICD-10-CM | POA: Diagnosis not present

## 2011-12-29 DIAGNOSIS — I453 Trifascicular block: Secondary | ICD-10-CM | POA: Diagnosis not present

## 2011-12-29 DIAGNOSIS — I359 Nonrheumatic aortic valve disorder, unspecified: Secondary | ICD-10-CM | POA: Diagnosis not present

## 2011-12-29 DIAGNOSIS — E785 Hyperlipidemia, unspecified: Secondary | ICD-10-CM | POA: Diagnosis not present

## 2011-12-31 ENCOUNTER — Telehealth: Payer: Self-pay | Admitting: Internal Medicine

## 2011-12-31 DIAGNOSIS — M5137 Other intervertebral disc degeneration, lumbosacral region: Secondary | ICD-10-CM | POA: Diagnosis not present

## 2011-12-31 DIAGNOSIS — M999 Biomechanical lesion, unspecified: Secondary | ICD-10-CM | POA: Diagnosis not present

## 2011-12-31 NOTE — Telephone Encounter (Signed)
Pt states he needs PA on his Adderalll through [facility]. He gave me a number of [phone]. His member ID # is [id number].   Adderall has been APPROVED for 12 months starting 12/31/2011. Left a detailed msg for the patient so he is aware. Instructed patient to call if there were any further problems filling this prescription.

## 2012-01-06 DIAGNOSIS — E785 Hyperlipidemia, unspecified: Secondary | ICD-10-CM | POA: Diagnosis not present

## 2012-01-06 DIAGNOSIS — E119 Type 2 diabetes mellitus without complications: Secondary | ICD-10-CM | POA: Diagnosis not present

## 2012-01-12 DIAGNOSIS — M5137 Other intervertebral disc degeneration, lumbosacral region: Secondary | ICD-10-CM | POA: Diagnosis not present

## 2012-01-12 DIAGNOSIS — M999 Biomechanical lesion, unspecified: Secondary | ICD-10-CM | POA: Diagnosis not present

## 2012-01-27 DIAGNOSIS — E119 Type 2 diabetes mellitus without complications: Secondary | ICD-10-CM | POA: Diagnosis not present

## 2012-01-27 DIAGNOSIS — E785 Hyperlipidemia, unspecified: Secondary | ICD-10-CM | POA: Diagnosis not present

## 2012-01-27 DIAGNOSIS — I10 Essential (primary) hypertension: Secondary | ICD-10-CM | POA: Diagnosis not present

## 2012-01-27 DIAGNOSIS — G47419 Narcolepsy without cataplexy: Secondary | ICD-10-CM | POA: Diagnosis not present

## 2012-02-03 DIAGNOSIS — M999 Biomechanical lesion, unspecified: Secondary | ICD-10-CM | POA: Diagnosis not present

## 2012-02-03 DIAGNOSIS — M5137 Other intervertebral disc degeneration, lumbosacral region: Secondary | ICD-10-CM | POA: Diagnosis not present

## 2012-02-18 DIAGNOSIS — M5137 Other intervertebral disc degeneration, lumbosacral region: Secondary | ICD-10-CM | POA: Diagnosis not present

## 2012-02-18 DIAGNOSIS — M999 Biomechanical lesion, unspecified: Secondary | ICD-10-CM | POA: Diagnosis not present

## 2012-03-03 DIAGNOSIS — M5137 Other intervertebral disc degeneration, lumbosacral region: Secondary | ICD-10-CM | POA: Diagnosis not present

## 2012-03-03 DIAGNOSIS — M999 Biomechanical lesion, unspecified: Secondary | ICD-10-CM | POA: Diagnosis not present

## 2012-03-08 DIAGNOSIS — M5137 Other intervertebral disc degeneration, lumbosacral region: Secondary | ICD-10-CM | POA: Diagnosis not present

## 2012-03-08 DIAGNOSIS — M999 Biomechanical lesion, unspecified: Secondary | ICD-10-CM | POA: Diagnosis not present

## 2012-03-22 DIAGNOSIS — M5137 Other intervertebral disc degeneration, lumbosacral region: Secondary | ICD-10-CM | POA: Diagnosis not present

## 2012-03-22 DIAGNOSIS — M999 Biomechanical lesion, unspecified: Secondary | ICD-10-CM | POA: Diagnosis not present

## 2012-03-31 DIAGNOSIS — M999 Biomechanical lesion, unspecified: Secondary | ICD-10-CM | POA: Diagnosis not present

## 2012-03-31 DIAGNOSIS — M5137 Other intervertebral disc degeneration, lumbosacral region: Secondary | ICD-10-CM | POA: Diagnosis not present

## 2012-04-25 DIAGNOSIS — M5137 Other intervertebral disc degeneration, lumbosacral region: Secondary | ICD-10-CM | POA: Diagnosis not present

## 2012-04-25 DIAGNOSIS — M999 Biomechanical lesion, unspecified: Secondary | ICD-10-CM | POA: Diagnosis not present

## 2012-05-08 ENCOUNTER — Telehealth: Payer: Self-pay | Admitting: Internal Medicine

## 2012-05-08 MED ORDER — AMPHETAMINE-DEXTROAMPHETAMINE 10 MG PO TABS: ORAL_TABLET | ORAL | Status: DC

## 2012-05-08 NOTE — Telephone Encounter (Signed)
RX has been printed and placed on CDY cart for signature.

## 2012-05-08 NOTE — Telephone Encounter (Signed)
RX at front for pick up; Pt is aware and will come by to get RX.

## 2012-05-26 DIAGNOSIS — M999 Biomechanical lesion, unspecified: Secondary | ICD-10-CM | POA: Diagnosis not present

## 2012-05-26 DIAGNOSIS — M5137 Other intervertebral disc degeneration, lumbosacral region: Secondary | ICD-10-CM | POA: Diagnosis not present

## 2012-05-31 DIAGNOSIS — E785 Hyperlipidemia, unspecified: Secondary | ICD-10-CM | POA: Diagnosis not present

## 2012-05-31 DIAGNOSIS — IMO0001 Reserved for inherently not codable concepts without codable children: Secondary | ICD-10-CM | POA: Diagnosis not present

## 2012-05-31 DIAGNOSIS — Z125 Encounter for screening for malignant neoplasm of prostate: Secondary | ICD-10-CM | POA: Diagnosis not present

## 2012-05-31 DIAGNOSIS — I10 Essential (primary) hypertension: Secondary | ICD-10-CM | POA: Diagnosis not present

## 2012-06-15 DIAGNOSIS — M5137 Other intervertebral disc degeneration, lumbosacral region: Secondary | ICD-10-CM | POA: Diagnosis not present

## 2012-06-15 DIAGNOSIS — M999 Biomechanical lesion, unspecified: Secondary | ICD-10-CM | POA: Diagnosis not present

## 2012-06-30 DIAGNOSIS — M5137 Other intervertebral disc degeneration, lumbosacral region: Secondary | ICD-10-CM | POA: Diagnosis not present

## 2012-06-30 DIAGNOSIS — M999 Biomechanical lesion, unspecified: Secondary | ICD-10-CM | POA: Diagnosis not present

## 2012-07-14 DIAGNOSIS — M5137 Other intervertebral disc degeneration, lumbosacral region: Secondary | ICD-10-CM | POA: Diagnosis not present

## 2012-07-14 DIAGNOSIS — M999 Biomechanical lesion, unspecified: Secondary | ICD-10-CM | POA: Diagnosis not present

## 2012-07-21 DIAGNOSIS — M5137 Other intervertebral disc degeneration, lumbosacral region: Secondary | ICD-10-CM | POA: Diagnosis not present

## 2012-07-21 DIAGNOSIS — M999 Biomechanical lesion, unspecified: Secondary | ICD-10-CM | POA: Diagnosis not present

## 2012-08-16 DIAGNOSIS — M5137 Other intervertebral disc degeneration, lumbosacral region: Secondary | ICD-10-CM | POA: Diagnosis not present

## 2012-08-16 DIAGNOSIS — M999 Biomechanical lesion, unspecified: Secondary | ICD-10-CM | POA: Diagnosis not present

## 2012-08-23 ENCOUNTER — Telehealth: Payer: Self-pay | Admitting: Internal Medicine

## 2012-08-23 MED ORDER — AMPHETAMINE-DEXTROAMPHETAMINE 10 MG PO TABS: ORAL_TABLET | ORAL | Status: DC

## 2012-08-23 NOTE — Telephone Encounter (Signed)
I spoke with pt and he is requesting adderall 10 mg refilled and he would like to pick this up. I have printed off RX and will place on CDY cart for his signature when he returns tomorrow. Pt aware will call once ready for pick up.

## 2012-08-24 NOTE — Telephone Encounter (Signed)
Left message that Rx is at front for pick up; if any other questions or concerns please call our office.

## 2012-08-30 DIAGNOSIS — E119 Type 2 diabetes mellitus without complications: Secondary | ICD-10-CM | POA: Diagnosis not present

## 2012-08-30 DIAGNOSIS — Z23 Encounter for immunization: Secondary | ICD-10-CM | POA: Diagnosis not present

## 2012-09-05 DIAGNOSIS — B353 Tinea pedis: Secondary | ICD-10-CM | POA: Diagnosis not present

## 2012-09-14 DIAGNOSIS — M5137 Other intervertebral disc degeneration, lumbosacral region: Secondary | ICD-10-CM | POA: Diagnosis not present

## 2012-09-14 DIAGNOSIS — M999 Biomechanical lesion, unspecified: Secondary | ICD-10-CM | POA: Diagnosis not present

## 2012-09-20 DIAGNOSIS — M999 Biomechanical lesion, unspecified: Secondary | ICD-10-CM | POA: Diagnosis not present

## 2012-09-20 DIAGNOSIS — M5137 Other intervertebral disc degeneration, lumbosacral region: Secondary | ICD-10-CM | POA: Diagnosis not present

## 2012-10-18 DIAGNOSIS — M5137 Other intervertebral disc degeneration, lumbosacral region: Secondary | ICD-10-CM | POA: Diagnosis not present

## 2012-10-18 DIAGNOSIS — M999 Biomechanical lesion, unspecified: Secondary | ICD-10-CM | POA: Diagnosis not present

## 2012-11-07 DIAGNOSIS — M5137 Other intervertebral disc degeneration, lumbosacral region: Secondary | ICD-10-CM | POA: Diagnosis not present

## 2012-11-07 DIAGNOSIS — M999 Biomechanical lesion, unspecified: Secondary | ICD-10-CM | POA: Diagnosis not present

## 2012-12-01 ENCOUNTER — Telehealth: Payer: Self-pay | Admitting: Internal Medicine

## 2012-12-01 DIAGNOSIS — I10 Essential (primary) hypertension: Secondary | ICD-10-CM | POA: Diagnosis not present

## 2012-12-01 DIAGNOSIS — M999 Biomechanical lesion, unspecified: Secondary | ICD-10-CM | POA: Diagnosis not present

## 2012-12-01 DIAGNOSIS — E785 Hyperlipidemia, unspecified: Secondary | ICD-10-CM | POA: Diagnosis not present

## 2012-12-01 DIAGNOSIS — M5137 Other intervertebral disc degeneration, lumbosacral region: Secondary | ICD-10-CM | POA: Diagnosis not present

## 2012-12-01 DIAGNOSIS — E119 Type 2 diabetes mellitus without complications: Secondary | ICD-10-CM | POA: Diagnosis not present

## 2012-12-01 MED ORDER — AMPHETAMINE-DEXTROAMPHETAMINE 10 MG PO TABS: ORAL_TABLET | ORAL | Status: DC

## 2012-12-01 NOTE — Telephone Encounter (Signed)
Pt requesting refill on adderall 10mg  1-3tabs daily Last refill 10.23.13 for #90 with 0 refills Last ov 2.21.13 w/ CY Follow up in 1 year >> scheduled for 2.26.14 Rx printed for CY to sign; placed on cart. LMOM TCB x1 to see if pt would like to pick this up.

## 2012-12-01 NOTE — Telephone Encounter (Signed)
12/01/12 lm on cell phone for pt to let us know if he want to pick up rx or have [place] mail this. rx was place up at front.

## 2012-12-20 DIAGNOSIS — M5137 Other intervertebral disc degeneration, lumbosacral region: Secondary | ICD-10-CM | POA: Diagnosis not present

## 2012-12-20 DIAGNOSIS — M999 Biomechanical lesion, unspecified: Secondary | ICD-10-CM | POA: Diagnosis not present

## 2012-12-27 ENCOUNTER — Ambulatory Visit: Payer: BC Managed Care – PPO | Admitting: Internal Medicine

## 2013-01-11 DIAGNOSIS — I453 Trifascicular block: Secondary | ICD-10-CM | POA: Diagnosis not present

## 2013-01-11 DIAGNOSIS — I10 Essential (primary) hypertension: Secondary | ICD-10-CM | POA: Diagnosis not present

## 2013-01-11 DIAGNOSIS — I2510 Atherosclerotic heart disease of native coronary artery without angina pectoris: Secondary | ICD-10-CM | POA: Diagnosis not present

## 2013-01-11 DIAGNOSIS — E785 Hyperlipidemia, unspecified: Secondary | ICD-10-CM | POA: Diagnosis not present

## 2013-01-11 DIAGNOSIS — I359 Nonrheumatic aortic valve disorder, unspecified: Secondary | ICD-10-CM | POA: Diagnosis not present

## 2013-01-30 ENCOUNTER — Ambulatory Visit (INDEPENDENT_AMBULATORY_CARE_PROVIDER_SITE_OTHER): Payer: BC Managed Care – PPO | Admitting: Internal Medicine

## 2013-01-30 ENCOUNTER — Encounter: Payer: Self-pay | Admitting: Internal Medicine

## 2013-01-30 VITALS — BP 120/70 | HR 59 | Ht 68.0 in | Wt 247.4 lb

## 2013-01-30 DIAGNOSIS — G4733 Obstructive sleep apnea (adult) (pediatric): Secondary | ICD-10-CM

## 2013-01-30 DIAGNOSIS — G47429 Narcolepsy in conditions classified elsewhere without cataplexy: Secondary | ICD-10-CM

## 2013-01-30 MED ORDER — AMPHETAMINE-DEXTROAMPHETAMINE 10 MG PO TABS: ORAL_TABLET | ORAL | Status: DC

## 2013-01-30 NOTE — Progress Notes (Signed)
12/23/11- 82 yoM former smoker followed for OSA, Narcolepsy w/o cataplexy, complicated by CAD, HBP.  VN Vet. LOV-12/17/10 Since last here he has used CPAP all night every night and would consider missing it. Pressure remains at 9/[facility]. He is comfortable with a nasal pillows mask. He feels that he sleeps well at night. Rarely needs a nap. Continues to use one10 mg adderall tablet each morning and occasionally one later in the day. Denies palpitation, chest pain or mood changes with this medication. We discussed controlled status, access and alternatives.  01/30/13- 70 yoM former smoker followed for OSA, Narcolepsy w/o cataplexy, complicated by CAD, HBP.  VN Vet. FOLLOWS [id number] CPAP 9/ [facility] every night for 6-8 hours; pressure working well for patient; needs RX for Adderall He is clear that he is better off with CPAP. Using Adderall 10 mg from 1-3 daily, well tolerated with no concerns. Occasional 20 minute nap also helps.  ROS-see HPI Constitutional:   No-   weight loss, night sweats, fevers, chills, fatigue, lassitude. HEENT:   No-  headaches, difficulty swallowing, tooth/dental problems, sore throat,       No-  sneezing, itching, ear ache, nasal congestion, post nasal drip,  CV:  No-   chest pain, orthopnea, PND, swelling in lower extremities, anasarca, dizziness, palpitations Resp: No-   shortness of breath with exertion or at rest.              No-   productive cough,  No non-productive cough,  No- coughing up of blood.              No-   change in color of mucus.  No- wheezing.   Skin: No-   rash or lesions. GI:  No-   heartburn, indigestion, abdominal pain, nausea, vomiting,  GU:  MS:  . Neuro-     nothing unusual Psych:  No- change in mood or affect. No depression or anxiety.  No memory loss.  OBJ- Physical Exam General- Alert, Oriented, Affect-appropriate, Distress- none acute. Stocky body build Skin- rash-none, lesions- none, excoriation- none Lymphadenopathy- none Head-  atraumatic            Eyes- Gross vision intact, PERRLA, conjunctivae and secretions clear            Ears- Hearing, canals-normal            Nose- Clear, no-Septal dev, mucus, polyps, erosion, perforation             Throat- Mallampati II , mucosa clear , drainage- none, tonsils- atrophic Neck- flexible , trachea midline, no stridor , thyroid nl, carotid no bruit Chest - symmetrical excursion , unlabored           Heart/CV- RRR , no murmur , no gallop  , no rub, nl s1 s2                           - JVD- none , edema- none, stasis changes- none, varices- none           Lung- clear to P&A, wheeze- none, cough- none , dullness-none, rub- none           Chest wall-  Abd- Br/ Gen/ Rectal- Not done, not indicated Extrem- cyanosis- none, clubbing, none, atrophy- none, strength- nl Neuro- grossly intact to observation

## 2013-01-30 NOTE — Patient Instructions (Addendum)
Script to refill adderall  We can continue CPAP 9/ [facility]  Please call as needed

## 2013-02-06 DIAGNOSIS — M5137 Other intervertebral disc degeneration, lumbosacral region: Secondary | ICD-10-CM | POA: Diagnosis not present

## 2013-02-06 DIAGNOSIS — R41840 Attention and concentration deficit: Secondary | ICD-10-CM | POA: Diagnosis not present

## 2013-02-06 DIAGNOSIS — M999 Biomechanical lesion, unspecified: Secondary | ICD-10-CM | POA: Diagnosis not present

## 2013-02-07 NOTE — Assessment & Plan Note (Signed)
Good compliance and control. No problems identified.

## 2013-02-07 NOTE — Assessment & Plan Note (Signed)
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 Plan-okay to continue Adderall and occasional nap.

## 2013-03-20 ENCOUNTER — Telehealth: Payer: Self-pay | Admitting: Internal Medicine

## 2013-03-20 DIAGNOSIS — M999 Biomechanical lesion, unspecified: Secondary | ICD-10-CM | POA: Diagnosis not present

## 2013-03-20 DIAGNOSIS — M5137 Other intervertebral disc degeneration, lumbosacral region: Secondary | ICD-10-CM | POA: Diagnosis not present

## 2013-03-20 MED ORDER — AMPHETAMINE-DEXTROAMPHETAMINE 10 MG PO TABS: ORAL_TABLET | ORAL | Status: DC

## 2013-03-20 NOTE — Telephone Encounter (Signed)
Rx printed, signed, and at front for pick up; left message on patients number given of this.

## 2013-03-20 NOTE — Telephone Encounter (Signed)
Last OV 01/30/13 Last fill 01/30/13 Pending OV 01/31/14  No Known Allergies  CY - please advise if you are okay with refilling this rx. Thanks.

## 2013-03-20 NOTE — Telephone Encounter (Signed)
Per CY-okay to refill as requested. 

## 2013-04-18 DIAGNOSIS — IMO0002 Reserved for concepts with insufficient information to code with codable children: Secondary | ICD-10-CM | POA: Diagnosis not present

## 2013-05-17 DIAGNOSIS — M999 Biomechanical lesion, unspecified: Secondary | ICD-10-CM | POA: Diagnosis not present

## 2013-05-17 DIAGNOSIS — M5137 Other intervertebral disc degeneration, lumbosacral region: Secondary | ICD-10-CM | POA: Diagnosis not present

## 2013-05-25 DIAGNOSIS — L2089 Other atopic dermatitis: Secondary | ICD-10-CM | POA: Diagnosis not present

## 2013-05-25 DIAGNOSIS — Z125 Encounter for screening for malignant neoplasm of prostate: Secondary | ICD-10-CM | POA: Diagnosis not present

## 2013-05-25 DIAGNOSIS — E785 Hyperlipidemia, unspecified: Secondary | ICD-10-CM | POA: Diagnosis not present

## 2013-05-25 DIAGNOSIS — E119 Type 2 diabetes mellitus without complications: Secondary | ICD-10-CM | POA: Diagnosis not present

## 2013-05-25 DIAGNOSIS — I10 Essential (primary) hypertension: Secondary | ICD-10-CM | POA: Diagnosis not present

## 2013-05-25 DIAGNOSIS — M25519 Pain in unspecified shoulder: Secondary | ICD-10-CM | POA: Diagnosis not present

## 2013-06-09 DIAGNOSIS — M75120 Complete rotator cuff tear or rupture of unspecified shoulder, not specified as traumatic: Secondary | ICD-10-CM | POA: Diagnosis not present

## 2013-06-18 ENCOUNTER — Telehealth: Payer: Self-pay | Admitting: Internal Medicine

## 2013-06-18 NOTE — Telephone Encounter (Signed)
Spoke to pt. Advised him that Adderall can not be called in or faxed to any pharmacy due to the type of medication it is. He agreed and nothing further was needed.

## 2013-06-21 ENCOUNTER — Telehealth: Payer: Self-pay | Admitting: Internal Medicine

## 2013-06-21 NOTE — Telephone Encounter (Signed)
Per SN---  CY is out of the office until Monday---i called and spoke with pt and he stated that he will wait until Monday until CY comes back to the office.  SN stated that he can give the pt an rx for #20 of the adderall but the pt stated that he will just wait.  CY please advise if ok to refill.  Thanks  Last ov--01/30/2013 Next ov--01/31/2014  No Known Allergies

## 2013-06-21 NOTE — Telephone Encounter (Signed)
I spoke with pt. He stated he is completely out of his medication adderrall. He is coming back from the beach today and needs this. Since CDY is off will forward to SN to see if he agree's to sign RX for pt. Please advise thanks Last refilled 03/20/13

## 2013-06-22 DIAGNOSIS — M25519 Pain in unspecified shoulder: Secondary | ICD-10-CM | POA: Diagnosis not present

## 2013-06-25 MED ORDER — AMPHETAMINE-DEXTROAMPHETAMINE 10 MG PO TABS: ORAL_TABLET | ORAL | Status: DC

## 2013-06-25 NOTE — Telephone Encounter (Signed)
LMOVM that Rx ready at front for pick up.

## 2013-06-25 NOTE — Telephone Encounter (Signed)
RX printed and placed on CDY cart for signature 

## 2013-06-25 NOTE — Telephone Encounter (Signed)
Per CY-ok to refill as I did last time.

## 2013-06-26 ENCOUNTER — Encounter: Payer: Self-pay | Admitting: Internal Medicine

## 2013-06-27 DIAGNOSIS — M75120 Complete rotator cuff tear or rupture of unspecified shoulder, not specified as traumatic: Secondary | ICD-10-CM | POA: Diagnosis not present

## 2013-08-02 ENCOUNTER — Telehealth: Payer: Self-pay | Admitting: Interventional Cardiology

## 2013-08-02 NOTE — Telephone Encounter (Signed)
New Problem  Pt needs medical clearance that he does not need to be pre medicated prior to having dental work. Please call pt to confirm that the fax has been sent. Appt set up for 08-06-13 @ 11am  Dr. Veronda Prude Fax# [phone] Office# [phone]

## 2013-08-03 ENCOUNTER — Telehealth: Payer: Self-pay | Admitting: Interventional Cardiology

## 2013-08-03 ENCOUNTER — Telehealth: Payer: Self-pay

## 2013-08-03 NOTE — Telephone Encounter (Signed)
returned pt call left a detailed message regarding dental procedure for 08/16/13 ok per Dr.Smith no clearnace or preprocedure medication needed

## 2013-08-03 NOTE — Telephone Encounter (Signed)
Follow up  Pt returned call///   Fax# [phone] Phone # [phone]

## 2013-08-03 NOTE — Telephone Encounter (Signed)
called pt and pt dentist to get more info as to what is needed from Dr.Smtih for pt upcoming dental procedure.dentist office is close on Fri. left a message for pt to return call.pt dentist appt is 08/06/13.spoke with Dr.Smith if pt not having any symptoms. Ok for pt to have dental procedure. Pt dentist can call us Monday.

## 2013-08-03 NOTE — Telephone Encounter (Signed)
error 

## 2013-08-07 ENCOUNTER — Other Ambulatory Visit: Payer: Self-pay | Admitting: Orthopedic Surgery

## 2013-08-10 ENCOUNTER — Encounter (HOSPITAL_COMMUNITY): Payer: Self-pay | Admitting: Pharmacy Technician

## 2013-08-13 ENCOUNTER — Other Ambulatory Visit: Payer: Self-pay | Admitting: Orthopedic Surgery

## 2013-08-14 ENCOUNTER — Encounter (HOSPITAL_COMMUNITY)
Admission: RE | Admit: 2013-08-14 | Discharge: 2013-08-14 | Disposition: A | Discharge status: Home / Self Care | Payer: BC Managed Care – PPO | Source: Ambulatory Visit | Attending: Orthopedic Surgery | Admitting: Orthopedic Surgery

## 2013-08-14 ENCOUNTER — Encounter (HOSPITAL_COMMUNITY)
Admission: RE | Admit: 2013-08-14 | Discharge: 2013-08-14 | Disposition: A | Discharge status: Home / Self Care | Payer: BC Managed Care – PPO | Source: Ambulatory Visit | Attending: Anesthesiology | Admitting: Anesthesiology

## 2013-08-14 ENCOUNTER — Encounter (HOSPITAL_COMMUNITY): Payer: Self-pay

## 2013-08-14 DIAGNOSIS — Z01810 Encounter for preprocedural cardiovascular examination: Secondary | ICD-10-CM | POA: Diagnosis not present

## 2013-08-14 DIAGNOSIS — S43429A Sprain of unspecified rotator cuff capsule, initial encounter: Secondary | ICD-10-CM | POA: Diagnosis not present

## 2013-08-14 DIAGNOSIS — M659 Synovitis and tenosynovitis, unspecified: Secondary | ICD-10-CM | POA: Diagnosis not present

## 2013-08-14 DIAGNOSIS — Z01818 Encounter for other preprocedural examination: Secondary | ICD-10-CM | POA: Diagnosis not present

## 2013-08-14 HISTORY — DX: Nausea with vomiting, unspecified: R11.2

## 2013-08-14 HISTORY — DX: Unspecified osteoarthritis, unspecified site: M19.90

## 2013-08-14 HISTORY — DX: Shortness of breath: R06.02

## 2013-08-14 HISTORY — DX: Other specified postprocedural states: Z98.890

## 2013-08-14 HISTORY — DX: Personal history of other medical treatment: Z92.89

## 2013-08-14 HISTORY — DX: Cardiac murmur, unspecified: R01.1

## 2013-08-14 LAB — BASIC METABOLIC PANEL
BUN: 19 mg/dL (ref 6–23)
Chloride: 102 mEq/L (ref 96–112)
Creatinine, Ser: 1 mg/dL (ref 0.50–1.35)
GFR calc Af Amer: 84 mL/min — ABNORMAL LOW (ref >90)
GFR calc non Af Amer: 73 mL/min — ABNORMAL LOW (ref >90)
Potassium: 4.3 mEq/L (ref 3.5–5.1)

## 2013-08-14 LAB — CBC
Hemoglobin: 14.8 g/dL (ref 13.0–17.0)
MCHC: 35 g/dL (ref 30.0–36.0)
Platelets: 246 10*3/uL (ref 150–400)
RDW: 13.9 % (ref 11.5–15.5)
WBC: 8.2 10*3/uL (ref 4.0–10.5)

## 2013-08-14 NOTE — Pre-Procedure Instructions (Signed)
Jordan [name]  08/14/2013   Your procedure is scheduled on:  Thurday, October 16th.  Report to .[facility], Main Entrance/Entrance "A" at 9:00 AM.  Call this number if you have problems the morning of surgery: [phone]   Remember:   Do not eat food or drink liquids after midnight. On WEDNESDAY   Take these medicines the morning of surgery with A SIP OF WATER: None              Take if needed: NITROSTAT.              Do not take metFORMIN (GLUCOPHAGE). Stop taking Mobic and any herbal medications.    Do not wear jewelry, make-up or nail polish.  Do not wear lotions, powders, or perfumes. You may wear deodorant.  Do not shave 48 hours prior to surgery. Men may shave face and neck.  Do not bring valuables to the hospital.  [facility] is not responsible  for any belongings or valuables.               Contacts, dentures or bridgework may not be worn into surgery.  Leave suitcase in the car. After surgery it may be brought to your room.  For patients admitted to the hospital, discharge time is determined by your treatment team.               Patients discharged the day of surgery will not be allowed to drive home.  Name and phone number of your driver: -with spouse  Special Instructions: Shower using CHG 2 nights before surgery and the night before surgery.  If you shower the day of surgery use CHG.  Use special wash - you have one bottle of CHG for all showers.  You should use approximately 1/3 of the bottle for each shower.   Please read over the following fact sheets that you were given: Pain Booklet, Coughing and Deep Breathing and Surgical Site Infection Prevention

## 2013-08-14 NOTE — Progress Notes (Signed)
Call to A. Zelenak,PAC, reported EKG report & pt's remarks of last cardiac cath., to include that he thinks it was in 2009 & didn't require intervention. Also reported that a call was made to [facility] for records.

## 2013-08-14 NOTE — Progress Notes (Signed)
Call for records, to [facility] , Arlina Robes [phone], records to be faxed to [phone]

## 2013-08-15 ENCOUNTER — Encounter (HOSPITAL_COMMUNITY): Payer: Self-pay

## 2013-08-15 MED ORDER — CEFAZOLIN SODIUM-DEXTROSE 2-3 GM-% IV SOLR
2.0000 g | INTRAVENOUS | Status: AC
  Administered 2013-08-16: 2 g via INTRAVENOUS
  Filled 2013-08-15: qty 50

## 2013-08-15 NOTE — Progress Notes (Signed)
Anesthesia Chart Review:  Patient is a 72 year old male scheduled for right shoulder arthroscopy with rotator cuff repair on 08/16/13 by Dr. Ave Filter.  History includes post-operative N/V, former smoker, obesity, CAD, murmur (mild MR, trivial TR, no AS 12/2011), HLD, OSA with CPAP use, DM2, narcolepsy, arthritis, tonsillectomy, nasal fracture repair.  PCP is Dr. Johny Blamer who cleared patient for this procedure.  Pulmonologist is Dr. Jetty Duhamel, last visit 02/07/13. Cardiologist is Dr. Verdis Prime, last visit 01/11/13 with no new testing and one year follow-up recommended.  EKG on 08/14/13 showed SB @ 59 bpm with first degree AVB, right BBB, LAFB, bifascicular block, moderate voltage criteria for LVH, cannot rule out septal infarct (age undetermined).  It appears stable since at least 12/29/11.  Echo on 12/29/11 showed LVEF 65-70%, moderate concentric LVH, mild LAE, moderate mitral annular calcification, mild MR, AV sclerosis but opens well, no AS, trivial TR.  Doppler findings suggestive of grade II diastolic dysfunction with elevated LA pressure.  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  No AV block with exercise.  No symptoms of angina. Normal BP response.  He has a known trifascicular block, so Dr. Katrinka Blazing did council him that he may eventually have to consider a pacemaker, but was he was asymptomatic at that time.  His last cardiac cath in Epic is from 2001.  This showed 20-30% LM.  LAD was heavily calcifed in the proximal segment.  It gives origin to several moderate size septal perforator branches.  Distal to the D2 branch there was an eccentric 85% stenosis in the LAD near the apex.  The vessel in this region was no greater than 2.25 mm in diameter.  50% ostial CX.  Ramus intermediate and RCA showed no significant disease. (He told his PAT RN that he thought his last cardiac cath was from ~ 2009.   [facility] cardiology only has a copy of his 2001 cath, so I did leave a message with Mr. Uhlig to inquire the whereabouts of any other cardiac caths that he may have had.  He left me a voicemail stating that he has had three cardiac caths--the last two done by Dr. Katrinka Blazing.  He is unsure of the exact dates.  I have checked Access Anywhere, Epic, and E-chart, and only one cardiac cath by Dr.  Katrinka Blazing was found.)  CXR on 08/14/13 showed no acute disease.  Preoperative labs noted.  A1C at [facility] on 05/25/13 was 6.1%.  I reviewed records with anesthesiologist Dr. Gypsy Balsam.  Since patient has had cardiology follow-up within the past year and has medical clearance it is anticipated that he can proceed as planned in no change in his CV symptomology.  Patient will be further evaluated by his assigned anesthesiologist on the day of surgery.  Velna Ochs [facility] Stay [facility]/Anesthesiology Phone [phone] 08/15/2013 4:38 PM

## 2013-08-16 ENCOUNTER — Encounter (HOSPITAL_COMMUNITY): Payer: Self-pay | Admitting: *Deleted

## 2013-08-16 ENCOUNTER — Encounter (HOSPITAL_COMMUNITY)
Admission: RE | Disposition: A | Discharge status: Home / Self Care | Payer: Self-pay | Source: Ambulatory Visit | Attending: Orthopedic Surgery

## 2013-08-16 ENCOUNTER — Ambulatory Visit (HOSPITAL_COMMUNITY)
Admission: RE | Admit: 2013-08-16 | Discharge: 2013-08-16 | Disposition: A | Discharge status: Home / Self Care | Payer: BC Managed Care – PPO | Source: Ambulatory Visit | Attending: Orthopedic Surgery | Admitting: Orthopedic Surgery

## 2013-08-16 ENCOUNTER — Encounter (HOSPITAL_COMMUNITY): Payer: BC Managed Care – PPO | Admitting: Vascular Surgery

## 2013-08-16 ENCOUNTER — Ambulatory Visit (HOSPITAL_COMMUNITY): Payer: BC Managed Care – PPO | Admitting: Certified Registered"

## 2013-08-16 DIAGNOSIS — G8918 Other acute postprocedural pain: Secondary | ICD-10-CM | POA: Diagnosis not present

## 2013-08-16 DIAGNOSIS — M659 Unspecified synovitis and tenosynovitis, unspecified site: Secondary | ICD-10-CM | POA: Insufficient documentation

## 2013-08-16 DIAGNOSIS — E785 Hyperlipidemia, unspecified: Secondary | ICD-10-CM | POA: Insufficient documentation

## 2013-08-16 DIAGNOSIS — M19019 Primary osteoarthritis, unspecified shoulder: Secondary | ICD-10-CM | POA: Insufficient documentation

## 2013-08-16 DIAGNOSIS — G47419 Narcolepsy without cataplexy: Secondary | ICD-10-CM | POA: Insufficient documentation

## 2013-08-16 DIAGNOSIS — Z01818 Encounter for other preprocedural examination: Secondary | ICD-10-CM | POA: Diagnosis not present

## 2013-08-16 DIAGNOSIS — Z79899 Other long term (current) drug therapy: Secondary | ICD-10-CM | POA: Insufficient documentation

## 2013-08-16 DIAGNOSIS — I10 Essential (primary) hypertension: Secondary | ICD-10-CM | POA: Insufficient documentation

## 2013-08-16 DIAGNOSIS — S43429A Sprain of unspecified rotator cuff capsule, initial encounter: Secondary | ICD-10-CM | POA: Insufficient documentation

## 2013-08-16 DIAGNOSIS — W19XXXA Unspecified fall, initial encounter: Secondary | ICD-10-CM | POA: Insufficient documentation

## 2013-08-16 DIAGNOSIS — G4733 Obstructive sleep apnea (adult) (pediatric): Secondary | ICD-10-CM | POA: Insufficient documentation

## 2013-08-16 DIAGNOSIS — Z01810 Encounter for preprocedural cardiovascular examination: Secondary | ICD-10-CM | POA: Insufficient documentation

## 2013-08-16 DIAGNOSIS — M25819 Other specified joint disorders, unspecified shoulder: Secondary | ICD-10-CM | POA: Diagnosis not present

## 2013-08-16 DIAGNOSIS — I2510 Atherosclerotic heart disease of native coronary artery without angina pectoris: Secondary | ICD-10-CM | POA: Insufficient documentation

## 2013-08-16 DIAGNOSIS — M75101 Unspecified rotator cuff tear or rupture of right shoulder, not specified as traumatic: Secondary | ICD-10-CM

## 2013-08-16 DIAGNOSIS — Z87891 Personal history of nicotine dependence: Secondary | ICD-10-CM | POA: Insufficient documentation

## 2013-08-16 DIAGNOSIS — S43439A Superior glenoid labrum lesion of unspecified shoulder, initial encounter: Secondary | ICD-10-CM | POA: Diagnosis not present

## 2013-08-16 DIAGNOSIS — S43499A Other sprain of unspecified shoulder joint, initial encounter: Secondary | ICD-10-CM | POA: Diagnosis not present

## 2013-08-16 HISTORY — PX: SHOULDER ARTHROSCOPY WITH ROTATOR CUFF REPAIR AND SUBACROMIAL DECOMPRESSION: SHX5686

## 2013-08-16 LAB — GLUCOSE, CAPILLARY
Glucose-Capillary: 117 mg/dL — ABNORMAL HIGH (ref 70–99)
Glucose-Capillary: 137 mg/dL — ABNORMAL HIGH (ref 70–99)

## 2013-08-16 SURGERY — SHOULDER ARTHROSCOPY WITH ROTATOR CUFF REPAIR AND SUBACROMIAL DECOMPRESSION
Anesthesia: General | Site: Shoulder | Laterality: Right | Wound class: Clean

## 2013-08-16 MED ORDER — PHENYLEPHRINE HCL 10 MG/ML IJ SOLN
10.0000 mg | INTRAVENOUS | Status: DC | PRN
  Administered 2013-08-16: 10 ug/min via INTRAVENOUS

## 2013-08-16 MED ORDER — OXYCODONE HCL 5 MG PO TABS: 5.0000 mg | ORAL_TABLET | Freq: Once | ORAL | Status: DC | PRN

## 2013-08-16 MED ORDER — DEXAMETHASONE SODIUM PHOSPHATE 4 MG/ML IJ SOLN
INTRAMUSCULAR | Status: DC | PRN
  Administered 2013-08-16: 4 mg

## 2013-08-16 MED ORDER — PROPOFOL 10 MG/ML IV BOLUS
INTRAVENOUS | Status: DC | PRN
  Administered 2013-08-16: 50 mg via INTRAVENOUS
  Administered 2013-08-16: 150 mg via INTRAVENOUS

## 2013-08-16 MED ORDER — ROCURONIUM BROMIDE 100 MG/10ML IV SOLN
INTRAVENOUS | Status: DC | PRN
  Administered 2013-08-16: 50 mg via INTRAVENOUS

## 2013-08-16 MED ORDER — POVIDONE-IODINE 7.5 % EX SOLN
Freq: Once | CUTANEOUS | Status: DC
  Filled 2013-08-16: qty 118

## 2013-08-16 MED ORDER — FENTANYL CITRATE 0.05 MG/ML IJ SOLN
INTRAMUSCULAR | Status: DC | PRN
  Administered 2013-08-16: 100 ug via INTRAVENOUS
  Administered 2013-08-16: 50 ug via INTRAVENOUS
  Administered 2013-08-16: 100 ug via INTRAVENOUS

## 2013-08-16 MED ORDER — LACTATED RINGERS IV SOLN
INTRAVENOUS | Status: DC
  Administered 2013-08-16: 10:00:00 via INTRAVENOUS

## 2013-08-16 MED ORDER — OXYCODONE HCL 5 MG/5ML PO SOLN: 5.0000 mg | Freq: Once | ORAL | Status: DC | PRN

## 2013-08-16 MED ORDER — ARTIFICIAL TEARS OP OINT
TOPICAL_OINTMENT | OPHTHALMIC | Status: DC | PRN
  Administered 2013-08-16: 1 via OPHTHALMIC

## 2013-08-16 MED ORDER — FENTANYL CITRATE 0.05 MG/ML IJ SOLN
50.0000 ug | INTRAMUSCULAR | Status: DC | PRN
  Administered 2013-08-16: 100 ug via INTRAVENOUS

## 2013-08-16 MED ORDER — FENTANYL CITRATE 0.05 MG/ML IJ SOLN
INTRAMUSCULAR | Status: AC
  Administered 2013-08-16: 100 ug via INTRAVENOUS
  Filled 2013-08-16: qty 2

## 2013-08-16 MED ORDER — GLYCOPYRROLATE 0.2 MG/ML IJ SOLN
INTRAMUSCULAR | Status: DC | PRN
  Administered 2013-08-16: .8 mg via INTRAVENOUS

## 2013-08-16 MED ORDER — LACTATED RINGERS IV SOLN
INTRAVENOUS | Status: DC | PRN
  Administered 2013-08-16 (×2): via INTRAVENOUS

## 2013-08-16 MED ORDER — SODIUM CHLORIDE 0.9 % IR SOLN
Status: DC | PRN
  Administered 2013-08-16: 1000 mL
  Administered 2013-08-16 (×2): 3000 mL

## 2013-08-16 MED ORDER — PROMETHAZINE HCL 25 MG/ML IJ SOLN: 6.2500 mg | INTRAMUSCULAR | Status: DC | PRN

## 2013-08-16 MED ORDER — MIDAZOLAM HCL 5 MG/5ML IJ SOLN
INTRAMUSCULAR | Status: DC | PRN
  Administered 2013-08-16: 2 mg via INTRAVENOUS

## 2013-08-16 MED ORDER — NEOSTIGMINE METHYLSULFATE 1 MG/ML IJ SOLN
INTRAMUSCULAR | Status: DC | PRN
  Administered 2013-08-16: 4 mg via INTRAVENOUS

## 2013-08-16 MED ORDER — ROPIVACAINE HCL 5 MG/ML IJ SOLN
INTRAMUSCULAR | Status: DC | PRN
  Administered 2013-08-16: 150 mg via EPIDURAL

## 2013-08-16 MED ORDER — MIDAZOLAM HCL 2 MG/2ML IJ SOLN
INTRAMUSCULAR | Status: AC
  Administered 2013-08-16: 2 mg via INTRAVENOUS
  Filled 2013-08-16: qty 2

## 2013-08-16 MED ORDER — ONDANSETRON HCL 4 MG/2ML IJ SOLN
INTRAMUSCULAR | Status: DC | PRN
  Administered 2013-08-16: 4 mg via INTRAMUSCULAR

## 2013-08-16 MED ORDER — OXYCODONE-ACETAMINOPHEN 5-325 MG PO TABS: 1.0000 | ORAL_TABLET | ORAL | Status: DC | PRN

## 2013-08-16 MED ORDER — MIDAZOLAM HCL 2 MG/2ML IJ SOLN
1.0000 mg | INTRAMUSCULAR | Status: DC | PRN
  Administered 2013-08-16: 2 mg via INTRAVENOUS

## 2013-08-16 MED ORDER — HYDROMORPHONE HCL PF 1 MG/ML IJ SOLN: 0.2500 mg | INTRAMUSCULAR | Status: DC | PRN

## 2013-08-16 SURGICAL SUPPLY — 95 items
ADH SKN CLS APL DERMABOND .7 (GAUZE/BANDAGES/DRESSINGS)
ANCHOR PEEK 4.75X19.1 SWLK C (Anchor) ×2 IMPLANT
BLADE LONG MED 31X9 (MISCELLANEOUS) IMPLANT
BLADE SURG 11 STRL SS (BLADE) ×2 IMPLANT
BLADE SURG 15 STRL LF DISP TIS (BLADE) IMPLANT
BLADE SURG 15 STRL SS (BLADE)
BLADE SURG ROTATE 9660 (MISCELLANEOUS) IMPLANT
BUR OVAL 4.0 (BURR) ×2 IMPLANT
CANISTER OMNI JUG 16 LITER (MISCELLANEOUS) IMPLANT
CANNULA 5.75X71 LONG (CANNULA) ×4 IMPLANT
CANNULA TWIST IN 8.25X7CM (CANNULA) ×2 IMPLANT
CHLORAPREP W/TINT 26ML (MISCELLANEOUS) ×2 IMPLANT
CLOTH BEACON ORANGE TIMEOUT ST (SAFETY) IMPLANT
COVER SURGICAL LIGHT HANDLE (MISCELLANEOUS) IMPLANT
DECANTER SPIKE VIAL GLASS SM (MISCELLANEOUS) IMPLANT
DERMABOND ADVANCED (GAUZE/BANDAGES/DRESSINGS)
DERMABOND ADVANCED .7 DNX12 (GAUZE/BANDAGES/DRESSINGS) IMPLANT
DRAPE INCISE IOBAN 66X45 STRL (DRAPES) ×2 IMPLANT
DRAPE STERI 35X30 U-POUCH (DRAPES) ×2 IMPLANT
DRAPE SURG 17X23 STRL (DRAPES) ×2 IMPLANT
DRAPE U-SHAPE 47X51 STRL (DRAPES) ×2 IMPLANT
DRAPE U-SHAPE 76X120 STRL (DRAPES) ×2 IMPLANT
DRILL BIT PANALOK RC 3.2 (BIT) IMPLANT
DRSG EMULSION OIL 3X3 NADH (GAUZE/BANDAGES/DRESSINGS) IMPLANT
DRSG PAD ABDOMINAL 8X10 ST (GAUZE/BANDAGES/DRESSINGS) ×2 IMPLANT
ELECT CAUTERY BLADE 6.4 (BLADE) IMPLANT
ELECT NEEDLE TIP 2.8 STRL (NEEDLE) IMPLANT
ELECT REM PT RETURN 9FT ADLT (ELECTROSURGICAL) ×2
ELECTRODE REM PT RTRN 9FT ADLT (ELECTROSURGICAL) ×1 IMPLANT
GAUZE XEROFORM 1X8 LF (GAUZE/BANDAGES/DRESSINGS) ×2 IMPLANT
GLOVE BIO SURGEON STRL SZ7 (GLOVE) ×2 IMPLANT
GLOVE BIO SURGEON STRL SZ7.5 (GLOVE) ×2 IMPLANT
GLOVE BIOGEL PI IND STRL 7.0 (GLOVE) ×3 IMPLANT
GLOVE BIOGEL PI IND STRL 8 (GLOVE) ×1 IMPLANT
GLOVE BIOGEL PI INDICATOR 7.0 (GLOVE) ×3
GLOVE BIOGEL PI INDICATOR 8 (GLOVE) ×1
GLOVE BIOGEL PI ORTHO PRO 7.5 (GLOVE) ×1
GLOVE PI ORTHO PRO STRL 7.5 (GLOVE) ×1 IMPLANT
GLOVE SURG SS PI 7.5 STRL IVOR (GLOVE) ×2 IMPLANT
GOWN PREVENTION PLUS LG XLONG (DISPOSABLE) ×2 IMPLANT
GOWN PREVENTION PLUS XLARGE (GOWN DISPOSABLE) ×2 IMPLANT
GOWN SRG XL XLNG 56XLVL 4 (GOWN DISPOSABLE) ×1 IMPLANT
GOWN STRL NON-REIN LRG LVL3 (GOWN DISPOSABLE) ×2 IMPLANT
GOWN STRL NON-REIN XL XLG LVL4 (GOWN DISPOSABLE) ×2
KIT BASIN OR (CUSTOM PROCEDURE TRAY) ×2 IMPLANT
KIT ROOM TURNOVER OR (KITS) ×2 IMPLANT
MANIFOLD NEPTUNE II (INSTRUMENTS) ×2 IMPLANT
NDL SUT 6 .5 CRC .975X.05 MAYO (NEEDLE) IMPLANT
NEEDLE 1/2 CIR CATGUT .05X1.09 (NEEDLE) IMPLANT
NEEDLE HYPO 25GX1X1/2 BEV (NEEDLE) IMPLANT
NEEDLE MAYO TAPER (NEEDLE)
NEEDLE SCORPION MULTI FIRE (NEEDLE) ×2 IMPLANT
NEEDLE SPNL 18GX3.5 QUINCKE PK (NEEDLE) ×2 IMPLANT
NS IRRIG 1000ML POUR BTL (IV SOLUTION) ×2 IMPLANT
PACK SHOULDER (CUSTOM PROCEDURE TRAY) ×2 IMPLANT
PAD ARMBOARD 7.5X6 YLW CONV (MISCELLANEOUS) ×4 IMPLANT
RESECTOR FULL RADIUS 4.2MM (BLADE) ×2 IMPLANT
SET ARTHROSCOPY TUBING (MISCELLANEOUS)
SET ARTHROSCOPY TUBING LN (MISCELLANEOUS) IMPLANT
SLING ARM FOAM STRAP LRG (SOFTGOODS) ×2 IMPLANT
SLING ARM FOAM STRAP MED (SOFTGOODS) IMPLANT
SPONGE GAUZE 4X4 12PLY (GAUZE/BANDAGES/DRESSINGS) ×2 IMPLANT
SPONGE LAP 4X18 X RAY DECT (DISPOSABLE) IMPLANT
STRIP CLOSURE SKIN 1/2X4 (GAUZE/BANDAGES/DRESSINGS) IMPLANT
SUCTION FRAZIER TIP 10 FR DISP (SUCTIONS) IMPLANT
SUPPORT WRAP ARM LG (MISCELLANEOUS) ×2 IMPLANT
SUT BONE WAX W31G (SUTURE) IMPLANT
SUT ETHIBOND 2 OS 4 DA (SUTURE) IMPLANT
SUT ETHIBOND NAB BRD #0 18IN (SUTURE) IMPLANT
SUT ETHILON 3 0 PS 1 (SUTURE) ×2 IMPLANT
SUT FIBERWIRE #2 38 T-5 BLUE (SUTURE) ×2
SUT FIBERWIRE 2-0 18 17.9 3/8 (SUTURE)
SUT MNCRL AB 3-0 PS2 18 (SUTURE) IMPLANT
SUT PDS AB 0 CT 36 (SUTURE) IMPLANT
SUT PROLENE 3 0 PS 2 (SUTURE) IMPLANT
SUT VIC AB 0 CT1 18XCR BRD 8 (SUTURE) IMPLANT
SUT VIC AB 0 CT1 8-18 (SUTURE)
SUT VIC AB 0 CT2 27 (SUTURE) IMPLANT
SUT VIC AB 2-0 CT1 27 (SUTURE)
SUT VIC AB 2-0 CT1 TAPERPNT 27 (SUTURE) IMPLANT
SUT VIC AB 2-0 FS1 27 (SUTURE) IMPLANT
SUT VIC AB 2-0 SH 18 (SUTURE) IMPLANT
SUTURE FIBERWR #2 38 T-5 BLUE (SUTURE) ×1 IMPLANT
SUTURE FIBERWR 2-0 18 17.9 3/8 (SUTURE) IMPLANT
SYR CONTROL 10ML LL (SYRINGE) IMPLANT
TAPE CLOTH SURG 6X10 WHT LF (GAUZE/BANDAGES/DRESSINGS) ×2 IMPLANT
TAPE FIBER 2MM 7IN #2 BLUE (SUTURE) ×2 IMPLANT
TOWEL OR 17X24 6PK STRL BLUE (TOWEL DISPOSABLE) ×2 IMPLANT
TOWEL OR 17X26 10 PK STRL BLUE (TOWEL DISPOSABLE) ×2 IMPLANT
TUBE CONNECTING 12X1/4 (SUCTIONS) ×2 IMPLANT
TUBE CONNECTING 20X1/4 (TUBING) ×2 IMPLANT
TUBING ARTHROSCOPY IRRIG 16FT (MISCELLANEOUS) ×2 IMPLANT
WAND 90 DEG TURBOVAC W/CORD (SURGICAL WAND) ×2 IMPLANT
WATER STERILE IRR 1000ML POUR (IV SOLUTION) ×2 IMPLANT
YANKAUER SUCT BULB TIP NO VENT (SUCTIONS) IMPLANT

## 2013-08-16 NOTE — Preoperative (Signed)
Beta Blockers   Reason not to administer Beta Blockers:Not Applicable 

## 2013-08-16 NOTE — Progress Notes (Signed)
Report given to Angel RN.

## 2013-08-16 NOTE — Transfer of Care (Signed)
Immediate Anesthesia Transfer of Care Note  Patient: Jordan [name]  Procedure(s) Performed: Procedure(s) with comments: SHOULDER ARTHROSCOPY WITH ROTATOR CUFF REPAIR AND SUBACROMIAL DECOMPRESSION (Right) - Right shoulder arthroscopy rotator cuff repair, subacromial decompression.  Patient Location: PACU  Anesthesia Type:General  Level of Consciousness: sedated  Airway & Oxygen Therapy: Patient Spontanous Breathing and Patient connected to face mask oxygen  Post-op Assessment: Report given to PACU RN and Post -op Vital signs reviewed and stable  Post vital signs: Reviewed and stable  Complications: No apparent anesthesia complications

## 2013-08-16 NOTE — Anesthesia Preprocedure Evaluation (Addendum)
Anesthesia Evaluation  Patient identified by MRN, date of birth, ID band Patient awake    Reviewed: Allergy & Precautions, H&P , NPO status , Patient's Chart, lab work & pertinent test results  History of Anesthesia Complications (+) PONV and history of anesthetic complications  Airway Mallampati: II TM Distance: >3 FB Neck ROM: Full    Dental  (+) Teeth Intact, Caps and Dental Advisory Given   Pulmonary sleep apnea ,    Pulmonary exam normal       Cardiovascular hypertension, Pt. on medications + CAD     Neuro/Psych negative neurological ROS  negative psych ROS   GI/Hepatic negative GI ROS, Neg liver ROS,   Endo/Other  diabetes, Oral Hypoglycemic Agents  Renal/GU negative Renal ROS     Musculoskeletal   Abdominal   Peds  Hematology   Anesthesia Other Findings   Reproductive/Obstetrics                          Anesthesia Physical Anesthesia Plan  ASA: III  Anesthesia Plan: General   Post-op Pain Management:    Induction: Intravenous  Airway Management Planned: Oral ETT  Additional Equipment:   Intra-op Plan:   Post-operative Plan: Extubation in OR  Informed Consent: I have reviewed the patients History and Physical, chart, labs and discussed the procedure including the risks, benefits and alternatives for the proposed anesthesia with the patient or authorized representative who has indicated his/her understanding and acceptance.   Dental advisory given  Plan Discussed with: CRNA, Anesthesiologist and Surgeon  Anesthesia Plan Comments:        Anesthesia Quick Evaluation

## 2013-08-16 NOTE — Anesthesia Postprocedure Evaluation (Signed)
Anesthesia Post Note  Patient: Jordan [name]  Procedure(s) Performed: Procedure(s) (LRB): SHOULDER ARTHROSCOPY WITH ROTATOR CUFF REPAIR AND SUBACROMIAL DECOMPRESSION (Right)  Anesthesia type: general  Patient location: PACU  Post pain: Pain level controlled  Post assessment: Patient's Cardiovascular Status Stable  Last Vitals:  Filed Vitals:   08/16/13 1403  BP: 126/73  Pulse: 55  Temp:   Resp: 18    Post vital signs: Reviewed and stable  Level of consciousness: sedated  Complications: No apparent anesthesia complications

## 2013-08-16 NOTE — Anesthesia Procedure Notes (Addendum)
Anesthesia Regional Block:  Interscalene brachial plexus block  Pre-Anesthetic Checklist: ,, timeout performed, Correct Patient, Correct Site, Correct Laterality, Correct Procedure, Correct Position, site marked, Risks and benefits discussed,  Surgical consent,  Pre-op evaluation,  At surgeon's request and post-op pain management  Laterality: Right  Prep: chloraprep       Needles:  Injection technique: Single-shot  Needle Type: Echogenic Stimulator Needle     Needle Length: 5cm 5 cm Needle Gauge: 22 and 22 G    Additional Needles:  Procedures: ultrasound guided (picture in chart) and nerve stimulator Interscalene brachial plexus block  Nerve Stimulator or Paresthesia:  Response: bicep contraction, 0.45 mA,   Additional Responses:   Narrative:  Start time: 08/16/2013 9:52 AM End time: 08/16/2013 10:02 AM Injection made incrementally with aspirations every 5 mL.  Performed by: Personally  Anesthesiologist: J. Adonis Huguenin, MD  Additional Notes: Functioning IV was confirmed and monitors applied.  A 50mm 22ga echogenic arrow stimulator was used. Sterile prep and drape,hand hygiene and sterile gloves were used.Ultrasound guidance: relevant anatomy identified, needle position confirmed, local anesthetic spread visualized around nerve(s)., vascular puncture avoided.  Image printed for medical record.  Negative aspiration and negative test dose prior to incremental administration of local anesthetic. The patient tolerated the procedure well.  Interscalene brachial plexus block Procedure Name: Intubation Date/Time: 08/16/2013 11:15 AM Performed by: Armandina Gemma Pre-anesthesia Checklist: Patient identified, Timeout performed, Emergency Drugs available, Suction available and Patient being monitored Oxygen Delivery Method: Circle system utilized Preoxygenation: Pre-oxygenation with 100% oxygen Intubation Type: IV induction and Cricoid Pressure applied Ventilation: Mask  ventilation without difficulty Laryngoscope Size: Miller and 2 Grade View: Grade I Tube type: Oral Tube size: 7.5 mm Number of attempts: 1 Airway Equipment and Method: Stylet Placement Confirmation: breath sounds checked- equal and bilateral,  ETT inserted through vocal cords under direct vision and positive ETCO2 Secured at: 23 cm Tube secured with: Tape Dental Injury: Teeth and Oropharynx as per pre-operative assessment  Comments: IV induction Singer- intubation AM CRNA- atraumatic teeth and mouth as preop

## 2013-08-16 NOTE — Progress Notes (Signed)
Orthopedic Tech Progress Note Patient Details:  Jordan [name] Sep 08, 1941 [id number] Delivered DonJoy shoulder immobilizer to pt.'s nurse. Patient ID: Jordan [name], male   DOB: 1941-02-06, 72 y.o.   MRN: [id number]   Lesle Chris 08/16/2013, 12:51 PM

## 2013-08-16 NOTE — Op Note (Signed)
Procedure(s): SHOULDER ARTHROSCOPY WITH ROTATOR CUFF REPAIR AND SUBACROMIAL DECOMPRESSION Procedure Note  BLADYN TIPPS male 72 y.o. 08/16/2013  Procedure(s) and Anesthesia Type:    #1 arthroscopic right shoulder rotator cuff repair #2 arthroscopic right shoulder subacromial decompression #3 right shoulder arthroscopic biceps tenotomy and debridement  Postoperative diagnosis: #1 right shoulder rotator cuff tear #2 right shoulder impingement #3 right shoulder proximal long head biceps tendon tear  Surgeon(s) and Role:    * Mable Paris, MD - Primary     Surgeon: Mable Paris   Assistants: Damita Lack PA-C (Danielle was present and scrubbed throughout the procedure and was essential in positioning, assisting with the camera and instrumentation,, and closure)  Anesthesia: General endotracheal anesthesia with preoperative interscalene block given by the attending anesthesiologist     Procedure Detail   Estimated Blood Loss: Min         Drains: none  Blood Given: none         Specimens: none        Complications:  * No complications entered in OR log *         Disposition: PACU - hemodynamically stable.         Condition: stable    Procedure:   INDICATIONS FOR SURGERY: The patient is 72 y.o. male who had a fall in June of this year with subsequent right shoulder pain and dysfunction. He had an MRI which was diagnostic for a supraspinatus tear. He was indicated for surgical fixation to try and decrease pain and restore function. He understood risks benefits alternatives to the procedure and wished to go forward with that.  OPERATIVE FINDINGS: Examination under anesthesia: No stiffness or instability. Diagnostic Arthroscopy:  Glenoid articular cartilage: Intact Humeral head articular cartilage: Intact Labrum: Intact, extensive tearing of the proximal long head biceps tendon. The biceps tenotomy was performed. Loose bodies:  None Synovitis: Moderate Rotator cuff: He had a complete tear of supraspinatus tendon which was actually torn about 1 cm medial to the cuff insertion. There is some residual tendon remaining on the tuberosity. This made it more difficult to repair. Ultimately was repaired with one 4.75 swivel lock anchor with a fiber tape in a single row and reinforced with the tendon to tendon repair . Coracoacromial ligament: Frayed indicating chronic impingement, moderate hooked anterior acromion, addressed with acromioplasty.  DESCRIPTION OF PROCEDURE: The patient was identified in preoperative  holding area where I personally marked the operative site after  verifying site, side, and procedure with the patient. An interscalene block was given by the attending anesthesiologist the holding area.  The patient was taken back to the operating room where general anesthesia was induced without complication and was placed in the beach-chair position with the back  elevated about 60 degrees and all extremities and head and neck carefully padded and  positioned.   The right upper extremity was then prepped and  draped in a standard sterile fashion. The appropriate time-out  procedure was carried out. The patient did receive IV antibiotics  within 30 minutes of incision.   A small posterior portal incision was made and the arthroscope was introduced into the joint. An anterior portal was then established above the subscapularis using needle localization. Small cannula was placed anteriorly. Diagnostic arthroscopy was then carried out with findings as described above.  He was noted to have extensive tearing of the proximal long head biceps tendon involving greater than 50% of the tendon thickness. Therefore I used a large biter through the  anterior portal to perform a biceps tenotomy off the superior labrum. This was then debrided with shaver and the ArthroCare. There is no significant detachment labrum. The humeral  joint surfaces were intact. There is some moderate synovitis which was addressed with the ArthriCare. The rotator cuff was examined and found to be completely torn just posterior to the biceps tendon involving entire supraspinatus. There was about 1 cm of residual tendon intact on the tuberosity of the medial one half the tuberosity was exposed. This was debrided with shaver.  The arthroscope was then introduced into the subacromial space a standard lateral portal was established with needle localization. The shaver was used through the lateral portal to perform extensive bursectomy, exposing the rotator cuff tear. The posterior cuff was intact. Again there is some residual tendon on the tuberosity. This was debrided back enough to expose the medial tuberosity. I didn't perform some subacromial releases and a little bit of a capsular release beneath the supraspinatus to try and mobilize as much possible. The tendon want to come posterior to anterior. A traction stitch in with a #2 FiberWire pulling the tendon anteriorly. I felt I would be able to repair it to the articular margin. Therefore a fiber tape was placed in an inverted mattress configuration using a scorpion suture passer. This was brought over to the prepared tuberosity with a 4.75 peak swivel lock anchor. I was able to repair the tendon down to the tuberosity with moderate tension. A #2 FiberWire in the swivel lock anchor was then used to pass one limb through the lateral intact tendon and the second limb through the repaired medial end of the tendon and then tied over the top to reinforce the repair.  The coracoacromial ligament was taken down off the anterior acromion with the ArthroCare exposing a moderate anterior acromial spur. A high-speed bur was then used through the lateral portal to take down the anterior acromial spur from lateral to medial in a standard acromioplasty.  The acromioplasty was also viewed from the lateral portal and the bur  was used as necessary to ensure that the acromion was completely flat from posterior to anterior.  The arthroscopic equipment was removed from the joint and the portals were closed with 3-0 nylon in an interrupted fashion. Sterile dressings were then applied including Xeroform 4 x 4's ABDs and tape. The patient was then allowed to awaken from general anesthesia, placed in a abduction pillow sling, transferred to the stretcher and taken to the recovery room in stable condition.   POSTOPERATIVE PLAN: The patient will be observed in the PACU. If he is doing okay and anesthesia feel comfortable sending him home he will be discharged home today and will followup in one week for suture removal and wound check.  If there is any concern about his respiratory status he'll be observed overnight He'll follow the standard cuff protocol.

## 2013-08-16 NOTE — H&P (Signed)
Jordan [name] is an 72 y.o. male.   Chief Complaint: R shoulder pain and dysfunction HPI: S/p fall 6/14 with increased shoulder pain and dysfunction.  MRI revealed full thickness RCT.  Indicated for surgical fixation to promote healing and decrease pain.  Past Medical History  Diagnosis Date  . Coronary heart disease     Dr Garnette Scheuermann  . Hyperlipidemia   . OSA (obstructive sleep apnea)     NPSG 12-13-98 AHI 22.7  . Narcolepsy     [name] 04-11-97; Mean Latency 1.75min, SOREM 2  . Complication of anesthesia   . PONV (postoperative nausea and vomiting)   . Shortness of breath     with exertion   . Diabetes mellitus without complication   . Arthritis     R shoulder, bone spur  . H/O cardiovascular stress test     perhaps last one was 2009  . Hypertension     saw Dr. Mendel Ryder last earl;y- 2014, cardiac cath. last done ?2009, blocks seen didn't require any intervention at that point.  [name] Heart murmur     12/29/11 echo: mild MR, no AS, trivial TR    Past Surgical History  Procedure Laterality Date  . Tonsillectomy    . Nasal fracture surgery    . Cardiac catheterization  2009?    Family History  Problem Relation Age of Onset  . Sleep apnea Sister   . Colon cancer Sister    Social History:  reports that he quit smoking about 46 years ago. His smoking use included Cigarettes. He smoked 2.50 packs per day. He does not have any smokeless tobacco history on file. He reports that he drinks alcohol. He reports that he does not use illicit drugs.  Allergies: No Known Allergies  Medications Prior to Admission  Medication Sig Dispense Refill  . amphetamine-dextroamphetamine (ADDERALL) 10 MG tablet Take 1-3 tablets by mouth daily  90 tablet  0  . aspirin 81 MG tablet Take 81 mg by mouth daily.      [name] atorvastatin (LIPITOR) 40 MG tablet Take 20 mg by mouth daily before breakfast.       . Clotrimazole-Betamethasone (LOTRISONE EX) Apply 1 application topically 2 (two) times daily.      .  Coenzyme Q10 (CO Q 10 PO) Take 1 tablet by mouth daily.      . Glucosamine-Chondroit-Vit C-Mn (GLUCOSAMINE CHONDR 1500 COMPLX) CAPS Take 1 capsule by mouth daily.      [name] lisinopril (PRINIVIL,ZESTRIL) 40 MG tablet Take 40 mg by mouth daily before breakfast.       . meloxicam (MOBIC) 15 MG tablet Take 15 mg by mouth daily.      . metFORMIN (GLUCOPHAGE) 500 MG tablet Take 1 tablet by mouth daily before breakfast.       . Multiple Vitamin (MULTIVITAMIN) tablet Take 1 tablet by mouth daily.      . Omega-3 Fatty Acids (FISH OIL) 1000 MG CAPS Take 1 capsule by mouth 3 (three) times daily.       [name] NITROSTAT 0.4 MG SL tablet Place 0.4 mg under the tongue every 5 (five) minutes as needed for chest pain.         Results for orders placed during the hospital encounter of 08/16/13 (from the past 48 hour(s))  GLUCOSE, CAPILLARY     Status: Abnormal   Collection Time    08/16/13  9:27 AM      Result Value Range   Glucose-Capillary 117 (*) 70 -  99 mg/dL   Dg Chest 2 View  84/69/6295   CLINICAL DATA:  Preoperative respiratory films. Patient for rotator cuff repair.  EXAM: CHEST  2 VIEW  COMPARISON:  PA and lateral chest 06/28/2007.  FINDINGS: Lungs are clear. Heart size is normal. No pneumothorax or pleural fluid.  IMPRESSION: No acute disease.   Electronically Signed   By: Drusilla Kanner M.D.   On: 08/14/2013 14:52    Review of Systems  All other systems reviewed and are negative.    Blood pressure 136/80, pulse 55, temperature 97.9 F (36.6 C), temperature source Oral, resp. rate 20, SpO2 97.00%. Physical Exam  Constitutional: He is oriented to person, place, and time. He appears well-developed and well-nourished.  HENT:  Head: Atraumatic.  Eyes: EOM are normal.  Cardiovascular: Intact distal pulses.   Respiratory: Effort normal.  Musculoskeletal:  R shoulder pain with elevation.  TTP over RC insertion.  Neurological: He is alert and oriented to person, place, and time.  Skin: Skin is warm  and dry.  Psychiatric: He has a normal mood and affect.     Assessment/Plan R shoulder rotator cuff tear Plan arth RCR/SAD Risks / benefits of surgery discussed Consent on chart  NPO for OR Preop antibiotics   Rodriquez Thorner WILLIAM 08/16/2013, 10:26 AM

## 2013-08-20 ENCOUNTER — Encounter (HOSPITAL_COMMUNITY): Payer: Self-pay | Admitting: Orthopedic Surgery

## 2013-11-09 DIAGNOSIS — Z23 Encounter for immunization: Secondary | ICD-10-CM | POA: Diagnosis not present

## 2013-12-22 DIAGNOSIS — Z6838 Body mass index (BMI) 38.0-38.9, adult: Secondary | ICD-10-CM | POA: Diagnosis not present

## 2013-12-22 DIAGNOSIS — Z136 Encounter for screening for cardiovascular disorders: Secondary | ICD-10-CM | POA: Diagnosis not present

## 2013-12-22 DIAGNOSIS — Z13220 Encounter for screening for lipoid disorders: Secondary | ICD-10-CM | POA: Diagnosis not present

## 2013-12-22 DIAGNOSIS — Z131 Encounter for screening for diabetes mellitus: Secondary | ICD-10-CM | POA: Diagnosis not present

## 2013-12-31 ENCOUNTER — Other Ambulatory Visit (HOSPITAL_COMMUNITY): Payer: BC Managed Care – PPO

## 2014-01-09 ENCOUNTER — Ambulatory Visit: Payer: BC Managed Care – PPO | Admitting: Interventional Cardiology

## 2014-01-09 ENCOUNTER — Other Ambulatory Visit: Payer: Self-pay

## 2014-01-09 MED ORDER — LISINOPRIL 40 MG PO TABS: 40.0000 mg | ORAL_TABLET | Freq: Every day | ORAL | Status: DC

## 2014-01-15 ENCOUNTER — Telehealth: Payer: Self-pay | Admitting: Internal Medicine

## 2014-01-15 MED ORDER — AMPHETAMINE-DEXTROAMPHETAMINE 10 MG PO TABS: ORAL_TABLET | ORAL | Status: DC

## 2014-01-15 NOTE — Telephone Encounter (Signed)
lmtcb x1-- rx printed for CDY to sign

## 2014-01-16 NOTE — Telephone Encounter (Signed)
Spoke with pt and advised that rx is at front ready for pick up 

## 2014-01-28 ENCOUNTER — Telehealth: Payer: Self-pay | Admitting: Internal Medicine

## 2014-01-28 NOTE — Telephone Encounter (Signed)
This will need PA done for the adderall.  Will check with [facility] tomorrow to see if she has the form.  Katie please advise. thanks

## 2014-01-29 NOTE — Telephone Encounter (Signed)
LMTCB-I have called [facility] [address] to fax the PA information to my attention in Triage. Pt needs to be made aware that this process can take up to 72 hours once insurance gets all information.Thanks

## 2014-01-29 NOTE — Telephone Encounter (Signed)
Patient brought in [place] request from to be filled out.  Holly gave to [employer].  Satira Anis

## 2014-01-29 NOTE — Telephone Encounter (Signed)
Pt was very frustrated he has not heard from the nurse yet. He would like a call back on his cell at [phone]. If you can't get him on that number please call [phone].

## 2014-01-29 NOTE — Telephone Encounter (Signed)
Called Medicare Line at [phone]; spoke with Charlena Cross; she was able to approve Adderral for 36 months. I spoke with [facility]-they are aware of approval.   I had to leave a message for patient to call me back so I can inform him of the approval.

## 2014-01-30 ENCOUNTER — Ambulatory Visit (HOSPITAL_COMMUNITY): Payer: BC Managed Care – PPO | Attending: Cardiology | Admitting: Radiology

## 2014-01-30 ENCOUNTER — Other Ambulatory Visit (HOSPITAL_COMMUNITY): Payer: Self-pay | Admitting: Interventional Cardiology

## 2014-01-30 DIAGNOSIS — I2510 Atherosclerotic heart disease of native coronary artery without angina pectoris: Secondary | ICD-10-CM

## 2014-01-30 NOTE — Telephone Encounter (Signed)
Left message on voicemail that this has been handled and if any other questions or concerns to please call me here at the office.

## 2014-01-30 NOTE — Progress Notes (Signed)
Echocardiogram performed.  

## 2014-01-31 ENCOUNTER — Encounter (INDEPENDENT_AMBULATORY_CARE_PROVIDER_SITE_OTHER): Payer: Self-pay

## 2014-01-31 ENCOUNTER — Ambulatory Visit (INDEPENDENT_AMBULATORY_CARE_PROVIDER_SITE_OTHER): Payer: BC Managed Care – PPO | Admitting: Internal Medicine

## 2014-01-31 ENCOUNTER — Encounter: Payer: Self-pay | Admitting: Internal Medicine

## 2014-01-31 VITALS — BP 136/70 | HR 62 | Ht 67.0 in | Wt 251.2 lb

## 2014-01-31 DIAGNOSIS — G47429 Narcolepsy in conditions classified elsewhere without cataplexy: Secondary | ICD-10-CM

## 2014-01-31 DIAGNOSIS — R0609 Other forms of dyspnea: Secondary | ICD-10-CM | POA: Diagnosis not present

## 2014-01-31 DIAGNOSIS — R0989 Other specified symptoms and signs involving the circulatory and respiratory systems: Secondary | ICD-10-CM

## 2014-01-31 DIAGNOSIS — R0600 Dyspnea, unspecified: Secondary | ICD-10-CM

## 2014-01-31 DIAGNOSIS — G4733 Obstructive sleep apnea (adult) (pediatric): Secondary | ICD-10-CM | POA: Diagnosis not present

## 2014-01-31 DIAGNOSIS — I2510 Atherosclerotic heart disease of native coronary artery without angina pectoris: Secondary | ICD-10-CM

## 2014-01-31 NOTE — Patient Instructions (Signed)
Order- Office Spriometry      Dyspnea on exertion  We can continue CPAP 9/ [employer]  We can refill Adderall as you call  Please call as needed

## 2014-01-31 NOTE — Progress Notes (Signed)
12/23/11- 68 yoM former smoker followed for OSA, Narcolepsy w/o cataplexy, complicated by CAD, HBP.  VN Vet. LOV-12/17/10 Since last here he has used CPAP all night every night and would consider missing it. Pressure remains at 9/[facility]. He is comfortable with a nasal pillows mask. He feels that he sleeps well at night. Rarely needs a nap. Continues to use one10 mg adderall tablet each morning and occasionally one later in the day. Denies palpitation, chest pain or mood changes with this medication. We discussed controlled status, access and alternatives.  01/30/13- 30 yoM former smoker followed for OSA, Narcolepsy w/o cataplexy, complicated by CAD, HBP.  VN Vet. FOLLOWS HAL:PFXTK CPAP 9/ [facility] every night for 6-8 hours; pressure working well for patient; needs RX for Adderall He is clear that he is better off with CPAP. Using Adderall 10 mg from 1-3 daily, well tolerated with no concerns. Occasional 20 minute nap also helps.  01/31/14-  81 yoM former smoker followed for OSA and Narcolepsy w/o cataplexy, complicated by CAD, HBP.  VN Vet. FOLLOWS FOR:  Wearing CPAP 9/ [facility] 6 hours per night-no complaints with pressure Adderall  10 mg 1-3 daily Good compliance and control. Continues to need Adderall 10 mg, usually once but sometimes up to 3 daily. Daily exercise at "Y." Variable DOE on stairs but not at rest. Office Spirometry 01/31/14-  Within normal limits-FVC 3.09/77%, FEV1 2.45/80% , FEV1/FEC 0.79, FEF 25-75% 2.45/87%.   ROS-see HPI Constitutional:   No-   weight loss, night sweats, fevers, chills, fatigue, lassitude. HEENT:   No-  headaches, difficulty swallowing, tooth/dental problems, sore throat,       No-  sneezing, itching, ear ache, nasal congestion, post nasal drip,  CV:  No-   chest pain, orthopnea, PND, swelling in lower extremities, anasarca, dizziness, palpitations Resp: No-   shortness of breath with exertion or at rest.              No-   productive cough,  No non-productive cough,   No- coughing up of blood.              No-   change in color of mucus.  No- wheezing.   Skin: No-   rash or lesions. GI:  No-   heartburn, indigestion, abdominal pain, nausea, vomiting,  GU:  MS:  . Neuro-     nothing unusual Psych:  No- change in mood or affect. No depression or anxiety.  No memory loss.  OBJ- Physical Exam General- Alert, Oriented, Affect-appropriate, Distress- none acute. overweight Skin- rash-none, lesions- none, excoriation- none Lymphadenopathy- none Head- atraumatic            Eyes- Gross vision intact, PERRLA, conjunctivae and secretions clear            Ears- Hearing, canals-normal            Nose- Clear, no-Septal dev, mucus, polyps, erosion, perforation             Throat- Mallampati II , mucosa clear , drainage- none, tonsils- atrophic Neck- flexible , trachea midline, no stridor , thyroid nl, carotid no bruit Chest - symmetrical excursion , unlabored           Heart/CV- RRR , no murmur , no gallop  , no rub, nl s1 s2                           - JVD- none , edema- none, stasis changes- none,  varices- none           Lung- clear to P&A, wheeze- none, cough- none , dullness-none, rub- none           Chest wall-  Abd- Br/ Gen/ Rectal- Not done, not indicated Extrem- cyanosis- none, clubbing, none, atrophy- none, strength- nl Neuro- grossly intact to observation

## 2014-02-04 ENCOUNTER — Telehealth: Payer: Self-pay

## 2014-02-04 ENCOUNTER — Telehealth: Payer: Self-pay | Admitting: Interventional Cardiology

## 2014-02-04 MED ORDER — NITROGLYCERIN 0.4 MG SL SUBL: 0.4000 mg | SUBLINGUAL_TABLET | SUBLINGUAL | Status: DC | PRN

## 2014-02-04 NOTE — Telephone Encounter (Signed)
New message    Nitrostat 0.4 mg    [facility] [address] [place].   [phone]. Store # [phone]

## 2014-02-04 NOTE — Telephone Encounter (Signed)
Done

## 2014-02-04 NOTE — Telephone Encounter (Signed)
called to give pt echo results.lmom for pt to call back 

## 2014-02-04 NOTE — Telephone Encounter (Signed)
Message copied by Lamar Laundry on Mon Feb 04, 2014 12:53 PM ------      Message from: Daneen Schick      Created: Thu Jan 31, 2014  9:35 AM       Thick heart muscle.Otherwise okay. Don't remember ordering. ------

## 2014-02-08 ENCOUNTER — Ambulatory Visit: Payer: BC Managed Care – PPO | Admitting: Interventional Cardiology

## 2014-03-03 DIAGNOSIS — R0609 Other forms of dyspnea: Secondary | ICD-10-CM | POA: Insufficient documentation

## 2014-03-03 NOTE — Assessment & Plan Note (Signed)
Dyspnea on exertion climbing stairs would be consistent with his body weight. Normal pulmonary function suggests this is not a pulmonary limitation. Cardiac contribution can be assessed by his cardiologist.

## 2014-03-03 NOTE — Assessment & Plan Note (Signed)
Good compliance and control with CPAP [facility]. Weight loss would help.

## 2014-03-03 NOTE — Assessment & Plan Note (Signed)
Compliant with CPAP. Stable use of Adderall which has been well-tolerated

## 2014-03-14 DIAGNOSIS — M5137 Other intervertebral disc degeneration, lumbosacral region: Secondary | ICD-10-CM | POA: Diagnosis not present

## 2014-03-14 DIAGNOSIS — M9981 Other biomechanical lesions of cervical region: Secondary | ICD-10-CM | POA: Diagnosis not present

## 2014-03-14 DIAGNOSIS — M999 Biomechanical lesion, unspecified: Secondary | ICD-10-CM | POA: Diagnosis not present

## 2014-03-22 ENCOUNTER — Encounter: Payer: Self-pay | Admitting: Interventional Cardiology

## 2014-03-22 ENCOUNTER — Ambulatory Visit (INDEPENDENT_AMBULATORY_CARE_PROVIDER_SITE_OTHER): Payer: BC Managed Care – PPO | Admitting: Interventional Cardiology

## 2014-03-22 VITALS — BP 134/79 | HR 54 | Ht 66.0 in | Wt 245.0 lb

## 2014-03-22 DIAGNOSIS — E785 Hyperlipidemia, unspecified: Secondary | ICD-10-CM

## 2014-03-22 DIAGNOSIS — I25810 Atherosclerosis of coronary artery bypass graft(s) without angina pectoris: Secondary | ICD-10-CM

## 2014-03-22 DIAGNOSIS — R0989 Other specified symptoms and signs involving the circulatory and respiratory systems: Secondary | ICD-10-CM

## 2014-03-22 DIAGNOSIS — I2510 Atherosclerotic heart disease of native coronary artery without angina pectoris: Secondary | ICD-10-CM

## 2014-03-22 DIAGNOSIS — R0609 Other forms of dyspnea: Secondary | ICD-10-CM

## 2014-03-22 DIAGNOSIS — I10 Essential (primary) hypertension: Secondary | ICD-10-CM

## 2014-03-22 DIAGNOSIS — R55 Syncope and collapse: Secondary | ICD-10-CM | POA: Diagnosis not present

## 2014-03-22 LAB — BRAIN NATRIURETIC PEPTIDE: Pro B Natriuretic peptide (BNP): 19 pg/mL (ref 0.0–100.0)

## 2014-03-22 NOTE — Progress Notes (Signed)
Patient ID: Jordan [name], male   DOB: Oct 10, 1941, 73 y.o.   MRN: [id number]    [address], [address] [place], [place]  [address] Phone: [phone] Fax:  [phone]  Date:  03/22/2014   ID:  Jordan [name], DOB 01-26-1941, MRN [id number]  PCP:  [name], MD   ASSESSMENT:  1. Syncope 2. Exertional chest tightness likely due to progression of coronary disease 3. Coronary atherosclerosis 4. Exertional dyspnea 5. Diastolic heart failure with LVH documented by recent echocardiogram 6. Bifascicular block, rule out chronotropic incompetence  PLAN:  1. Pharmacologic myocardial perfusion study is required because the patient is unable to increase his heart rate with exercise 2. 48 hour Holter monitor to rule out bradycardia and chronotropic incompetence  3. BNP 4. Further management depending upon findings of above   SUBJECTIVE: Jordan [name] is a 73 y.o. male who complains of progressive exertional fatigue, dyspnea, and chest fullness. His exertional activity has significantly been Curto by this complaint. After several minutes of resting he feels back to normal. He denies orthopnea, PND, but has has some mild lower extremity swelling. He has not had palpitations but did have a sudden episode of loss of stress and ended up on the steps in his outdoor patio. He does not believe he passed out but this episode was not witnessed by others. He had no head trauma. He has seen pulmonology, Dr. Annamaria Boots, and the lung situation is felt to be relatively stable.   Wt Readings from Last 3 Encounters:  03/22/14 245 lb (111.131 kg)  01/31/14 251 lb 3.2 oz (113.944 kg)  08/14/13 246 lb 14.6 oz (111.999 kg)     Past Medical History  Diagnosis Date  . Coronary heart disease     Dr Pernell Dupre  . Hyperlipidemia   . OSA (obstructive sleep apnea)     NPSG 12-13-98 AHI 22.7  . Narcolepsy     [name] 04-11-97; Mean Latency 1.40min, SOREM 2  . Complication of anesthesia   . PONV  (postoperative nausea and vomiting)   . Shortness of breath     with exertion   . Diabetes mellitus without complication   . Arthritis     R shoulder, bone spur  . H/O cardiovascular stress test     perhaps last one was 2009  . Hypertension     saw Dr. Linard Millers last earl;y- 2014, cardiac cath. last done ?2009, blocks seen didn't require any intervention at that point.  [name] Heart murmur     12/29/11 echo: mild MR, no AS, trivial TR    Current Outpatient Prescriptions  Medication Sig Dispense Refill  . amphetamine-dextroamphetamine (ADDERALL) 10 MG tablet Take 1-3 tablets by mouth daily  90 tablet  0  . aspirin 81 MG tablet Take 81 mg by mouth daily.      [name] atorvastatin (LIPITOR) 40 MG tablet Take 20 mg by mouth daily before breakfast.       . clotrimazole-betamethasone (LOTRISONE) cream Apply 1 application topically as needed.      . Glucosamine-Chondroit-Vit C-Mn (GLUCOSAMINE CHONDR 1500 COMPLX) CAPS Take 1 capsule by mouth daily.      [name] lisinopril (PRINIVIL,ZESTRIL) 40 MG tablet Take 1 tablet (40 mg total) by mouth daily before breakfast.  90 tablet  0  . metFORMIN (GLUCOPHAGE) 500 MG tablet Take 1 tablet by mouth daily before breakfast.       . Multiple Vitamin (MULTIVITAMIN) tablet Take 1 tablet by mouth daily.      [name]  nitroGLYCERIN (NITROSTAT) 0.4 MG SL tablet Place 1 tablet (0.4 mg total) under the tongue every 5 (five) minutes as needed for chest pain.  25 tablet  0  . Omega-3 Fatty Acids (FISH OIL) 1000 MG CAPS Take 1 capsule by mouth 3 (three) times daily.        No current facility-administered medications for this visit.    Allergies:   No Known Allergies  Social History:  The patient  reports that he quit smoking about 46 years ago. His smoking use included Cigarettes. He smoked 2.50 packs per day. He does not have any smokeless tobacco history on file. He reports that he drinks alcohol. He reports that he does not use illicit drugs.   ROS:  Please see the history of present  illness.   Increased appetite, obese, sudden episodes of weakness, "no fainting", denies orthopnea.   All other systems reviewed and negative.   OBJECTIVE: VS:  BP 134/79  Pulse 54  Ht 5\' 6"  (1.676 m)  Wt 245 lb (111.131 kg)  BMI 39.56 kg/m2 Well nourished, well developed, in no acute distress, obese HEENT: normal Neck: JVD elevated. Carotid bruit absent  Cardiac:  normal S1, S2; RRR; no murmur. An S4 gallop is audible Lungs:  clear to auscultation bilaterally, no wheezing, rhonchi or rales Abd: soft, nontender, no hepatomegaly Ext: Edema trace to 1+ bilateral. Pulses 2+ Skin: warm and dry Neuro:  CNs 2-12 intact, no focal abnormalities noted  EKG:  Not performed       Signed, Illene Labrador III, MD 03/22/2014 9:15 AM

## 2014-03-22 NOTE — Patient Instructions (Addendum)
Your physician recommends that you continue on your current medications as directed. Please refer to the Current Medication list given to you today.  Lab Today: Bnp  Your physician has recommended that you wear a holter monitor. Holter monitors are medical devices that record the heart's electrical activity. Doctors most often use these monitors to diagnose arrhythmias. Arrhythmias are problems with the speed or rhythm of the heartbeat. The monitor is a small, portable device. You can wear one while you do your normal daily activities. This is usually used to diagnose what is causing palpitations/syncope (passing out).   Your physician has requested that you have a lexiscan myoview. For further information please visit [email or web address]. Please follow instruction sheet, as given.  Follow up pending results

## 2014-03-26 ENCOUNTER — Telehealth: Payer: Self-pay

## 2014-03-26 ENCOUNTER — Encounter: Payer: Self-pay | Admitting: *Deleted

## 2014-03-26 ENCOUNTER — Ambulatory Visit (HOSPITAL_COMMUNITY): Payer: BC Managed Care – PPO | Attending: Interventional Cardiology | Admitting: Radiology

## 2014-03-26 ENCOUNTER — Encounter (INDEPENDENT_AMBULATORY_CARE_PROVIDER_SITE_OTHER): Payer: BC Managed Care – PPO

## 2014-03-26 VITALS — BP 142/88 | HR 50 | Ht 66.0 in | Wt 247.0 lb

## 2014-03-26 DIAGNOSIS — R55 Syncope and collapse: Secondary | ICD-10-CM | POA: Insufficient documentation

## 2014-03-26 DIAGNOSIS — R0989 Other specified symptoms and signs involving the circulatory and respiratory systems: Secondary | ICD-10-CM | POA: Insufficient documentation

## 2014-03-26 DIAGNOSIS — I25810 Atherosclerosis of coronary artery bypass graft(s) without angina pectoris: Secondary | ICD-10-CM

## 2014-03-26 DIAGNOSIS — R079 Chest pain, unspecified: Secondary | ICD-10-CM

## 2014-03-26 DIAGNOSIS — R5383 Other fatigue: Secondary | ICD-10-CM

## 2014-03-26 DIAGNOSIS — I2510 Atherosclerotic heart disease of native coronary artery without angina pectoris: Secondary | ICD-10-CM

## 2014-03-26 DIAGNOSIS — R0789 Other chest pain: Secondary | ICD-10-CM | POA: Insufficient documentation

## 2014-03-26 DIAGNOSIS — R5381 Other malaise: Secondary | ICD-10-CM | POA: Insufficient documentation

## 2014-03-26 DIAGNOSIS — R0609 Other forms of dyspnea: Secondary | ICD-10-CM | POA: Insufficient documentation

## 2014-03-26 MED ORDER — REGADENOSON 0.4 MG/5ML IV SOLN
0.4000 mg | Freq: Once | INTRAVENOUS | Status: AC
  Administered 2014-03-26: 0.4 mg via INTRAVENOUS

## 2014-03-26 MED ORDER — TECHNETIUM TC 99M SESTAMIBI GENERIC - CARDIOLITE
10.0000 | Freq: Once | INTRAVENOUS | Status: AC | PRN
  Administered 2014-03-26: 10 via INTRAVENOUS

## 2014-03-26 MED ORDER — TECHNETIUM TC 99M SESTAMIBI GENERIC - CARDIOLITE
30.0000 | Freq: Once | INTRAVENOUS | Status: AC | PRN
  Administered 2014-03-26: 30 via INTRAVENOUS

## 2014-03-26 NOTE — Progress Notes (Signed)
Patient ID: Jordan [name], male   DOB: 1941/03/06, 73 y.o.   MRN: [id number] EVO  48 hour holter monitor applied to patient.

## 2014-03-26 NOTE — Progress Notes (Signed)
[facility] [address] [place], [place] [address] [phone]    Cardiology Nuclear Med Study  Jordan [name] is a 73 y.o. male     MRN : [id number]     DOB: 1940-12-15  Procedure Date: 03/26/2014  Nuclear Med Background Indication for Stress Test:  Evaluation for Ischemia History:  CAD, Cath, Echo 2015 EF 55-60% (mild LVH), MPI 2010 (normal) EF 69% Cardiac Risk Factors: History of Smoking, Hypertension, Lipids and NIDDM  Symptoms:  Chest Tightness, DOE, Fatigue with Exertion and Near Syncope   Nuclear Pre-Procedure Caffeine/Decaff Intake:  None NPO After: 7:00pm   Lungs:  clear O2 Sat: 94% on room air. IV 0.9% NS with Angio Cath:  22g  IV Site: R Hand  IV Started by:  Matilde Haymaker, RN  Chest Size (in):  48 Cup Size: n/a  Height: 5\' 6"  (1.676 m)  Weight:  247 lb (112.038 kg)  BMI:  Body mass index is 39.89 kg/(m^2). Tech Comments:  n/a    Nuclear Med Study 1 or 2 day study: 1 day  Stress Test Type:  Treadmill/Lexiscan  Reading MD: n/a  Order Authorizing Provider:  Mallie Mussel Smith,MD  Resting Radionuclide: Technetium 70m Sestamibi  Resting Radionuclide Dose: 11.0 mCi   Stress Radionuclide:  Technetium 55m Sestamibi  Stress Radionuclide Dose: 33.0 mCi           Stress Protocol Rest HR: 50 Stress HR: 68  Rest BP: 142/88 Stress BP: 101/57  Exercise Time (min): n/a METS: n/a           Dose of Adenosine (mg):  n/a Dose of Lexiscan: 0.4 mg  Dose of Atropine (mg): n/a Dose of Dobutamine: n/a mcg/kg/min (at max HR)  Stress Test Technologist: Glade Lloyd, BS-ES  Nuclear Technologist:  Charlton Amor, CNMT     Rest Procedure:  Myocardial perfusion imaging was performed at rest 45 minutes following the intravenous administration of Technetium 77m Sestamibi. Rest ECG: NSR - Normal EKG  Stress Procedure:  The patient received IV Lexiscan 0.4 mg over 15-seconds with concurrent low level exercise and then Technetium 55m Sestamibi was  injected at 30-seconds while the patient continued walking one more minute.  Quantitative spect images were obtained after a 45-minute delay. Stress ECG: No significant change from baseline ECG  QPS Raw Data Images:  Normal; no motion artifact; normal heart/lung ratio. Stress Images:  There is a medium sized area of mild - moderate attenuation of the inferiolateral apex.  The uptake in the other regions is normal.   Rest Images:  There is a medium sized area of mild - moderate attenuation of the inferiolateral apex.  The uptake in the other regions is normal. Subtraction (SDS):  No evidence of ischemia. Transient Ischemic Dilatation (Normal <1.22):  1.01 Lung/Heart Ratio (Normal <0.45):  0.35  Quantitative Gated Spect Images QGS EDV:  134 ml QGS ESV:  48 ml  Impression Exercise Capacity:  Lexiscan with low level exercise. BP Response:  Normal blood pressure response. Clinical Symptoms:  No significant symptoms noted. ECG Impression:  No significant ST segment change suggestive of ischemia. Comparison with Prior Nuclear Study: No images to compare  Overall Impression:  Low risk stress nuclear study .   There is no evidence of ischemia.  There is a fixed defect in the apical inferiolateral region.   That area contracts well.  .  LV Ejection Fraction: 64%.  LV Wall Motion:  NL LV Function; NL Wall Motion.  Thayer Headings, Brooke Bonito., MD, [facility] 03/26/2014, 6:09 PM [address]  [place] Pager [phone]

## 2014-03-26 NOTE — Telephone Encounter (Signed)
Message copied by Lamar Laundry on Tue Mar 26, 2014  2:13 PM ------      Message from: Daneen Schick      Created: Tue Mar 26, 2014  8:15 AM       No fluid buildup in lungs ------

## 2014-03-26 NOTE — Telephone Encounter (Signed)
pt given lab results.No fluid buildup in lungs.pt verbalized understanding.

## 2014-03-29 ENCOUNTER — Telehealth: Payer: Self-pay | Admitting: Internal Medicine

## 2014-03-29 NOTE — Telephone Encounter (Signed)
PT CALLING AGAIN A/B PRESCRIPT FOR PICK UP.Jordan [name]

## 2014-03-29 NOTE — Telephone Encounter (Signed)
CDY not in the office at this time [facility]

## 2014-04-01 MED ORDER — AMPHETAMINE-DEXTROAMPHETAMINE 10 MG PO TABS: ORAL_TABLET | ORAL | Status: DC

## 2014-04-01 NOTE — Telephone Encounter (Signed)
Last rx 3-15. Last OV 01/31/14, next OV in 1 year. Rx printed and placed on CY cart to sign. Pt wants to pick-up rx once ready. Liberty Bing, CMA

## 2014-04-02 NOTE — Telephone Encounter (Signed)
Per Meghan rx placed at front. Pt is aware. Primghar Bing, CMA

## 2014-04-05 ENCOUNTER — Telehealth: Payer: Self-pay

## 2014-04-05 DIAGNOSIS — M5137 Other intervertebral disc degeneration, lumbosacral region: Secondary | ICD-10-CM | POA: Diagnosis not present

## 2014-04-05 DIAGNOSIS — M9981 Other biomechanical lesions of cervical region: Secondary | ICD-10-CM | POA: Diagnosis not present

## 2014-04-05 DIAGNOSIS — M999 Biomechanical lesion, unspecified: Secondary | ICD-10-CM | POA: Diagnosis not present

## 2014-04-05 NOTE — Telephone Encounter (Signed)
Lets do coronary angiogram and make sure there is not more going on than stress suggests. Schedule as a left and right via the right radial artery and the left brachial vein

## 2014-04-05 NOTE — Telephone Encounter (Signed)
pt given myoview results.Low risk study. Implies no significant blockage pt verbalized understanding.Pt concerned about what is causing his discomfort.adv pt I will fwd his concern to Dr.Smith.

## 2014-04-05 NOTE — Telephone Encounter (Signed)
Message copied by Lamar Laundry on Fri Apr 05, 2014  9:11 AM ------      Message from: Daneen Schick      Created: Wed Apr 03, 2014  5:50 PM       Low risk study. Implies no significant blockage. ------

## 2014-04-10 NOTE — Telephone Encounter (Signed)
called to give pt Dr.Smith's recommendations. lmtcb 

## 2014-05-13 ENCOUNTER — Telehealth: Payer: Self-pay

## 2014-05-13 NOTE — Telephone Encounter (Signed)
called to give pt holter monitor resultd.lmtcb

## 2014-05-14 ENCOUNTER — Other Ambulatory Visit: Payer: Self-pay

## 2014-05-14 MED ORDER — LISINOPRIL 40 MG PO TABS: 40.0000 mg | ORAL_TABLET | Freq: Every day | ORAL | Status: DC

## 2014-05-28 DIAGNOSIS — E785 Hyperlipidemia, unspecified: Secondary | ICD-10-CM | POA: Diagnosis not present

## 2014-05-28 DIAGNOSIS — Z0000 Encounter for general adult medical examination without abnormal findings: Secondary | ICD-10-CM | POA: Diagnosis not present

## 2014-05-28 DIAGNOSIS — G47419 Narcolepsy without cataplexy: Secondary | ICD-10-CM | POA: Diagnosis not present

## 2014-05-28 DIAGNOSIS — I2510 Atherosclerotic heart disease of native coronary artery without angina pectoris: Secondary | ICD-10-CM | POA: Diagnosis not present

## 2014-05-28 DIAGNOSIS — G4730 Sleep apnea, unspecified: Secondary | ICD-10-CM | POA: Diagnosis not present

## 2014-05-28 DIAGNOSIS — I10 Essential (primary) hypertension: Secondary | ICD-10-CM | POA: Diagnosis not present

## 2014-05-28 DIAGNOSIS — Z125 Encounter for screening for malignant neoplasm of prostate: Secondary | ICD-10-CM | POA: Diagnosis not present

## 2014-05-28 DIAGNOSIS — E119 Type 2 diabetes mellitus without complications: Secondary | ICD-10-CM | POA: Diagnosis not present

## 2014-05-29 ENCOUNTER — Telehealth: Payer: Self-pay

## 2014-05-29 DIAGNOSIS — R0602 Shortness of breath: Secondary | ICD-10-CM

## 2014-05-29 DIAGNOSIS — Z01810 Encounter for preprocedural cardiovascular examination: Secondary | ICD-10-CM

## 2014-05-29 NOTE — Telephone Encounter (Signed)
called to give pt holter results and Dr.Smith's instructions. have left several messages for pt to call the office.lmtcb

## 2014-05-30 NOTE — Telephone Encounter (Signed)
returned pt call. lmtcb 

## 2014-05-30 NOTE — Telephone Encounter (Signed)
Follow up ° ° ° ° ° ° ° ° ° °Pt returning nurses call °

## 2014-05-31 ENCOUNTER — Other Ambulatory Visit (INDEPENDENT_AMBULATORY_CARE_PROVIDER_SITE_OTHER): Payer: BC Managed Care – PPO

## 2014-05-31 ENCOUNTER — Encounter: Payer: Self-pay | Admitting: *Deleted

## 2014-05-31 DIAGNOSIS — Z01810 Encounter for preprocedural cardiovascular examination: Secondary | ICD-10-CM

## 2014-05-31 DIAGNOSIS — R0602 Shortness of breath: Secondary | ICD-10-CM

## 2014-05-31 LAB — CBC
HCT: 42.7 % (ref 39.0–52.0)
HEMOGLOBIN: 14.1 g/dL (ref 13.0–17.0)
MCHC: 33 g/dL (ref 30.0–36.0)
MCV: 88.7 fl (ref 78.0–100.0)
PLATELETS: 247 10*3/uL (ref 150.0–400.0)
RBC: 4.81 Mil/uL (ref 4.22–5.81)
RDW: 14.6 % (ref 11.5–15.5)
WBC: 7.6 10*3/uL (ref 4.0–10.5)

## 2014-05-31 LAB — BASIC METABOLIC PANEL
BUN: 20 mg/dL (ref 6–23)
CALCIUM: 9.4 mg/dL (ref 8.4–10.5)
CO2: 26 meq/L (ref 19–32)
Chloride: 107 mEq/L (ref 96–112)
Creatinine, Ser: 0.9 mg/dL (ref 0.4–1.5)
GFR: 86.49 mL/min (ref >60.00)
GLUCOSE: 110 mg/dL — AB (ref 70–99)
Potassium: 4 mEq/L (ref 3.5–5.1)
Sodium: 139 mEq/L (ref 135–145)

## 2014-05-31 LAB — PROTIME-INR
INR: 1.1 ratio — ABNORMAL HIGH (ref 0.8–1.0)
Prothrombin Time: 12.2 s (ref 9.6–13.1)

## 2014-05-31 NOTE — Telephone Encounter (Signed)
Pt scheduled for a right and left heart cath as instructed by Dr Tamala Julian from documentation 04/05/14. Cath scheduled for 8/5 at 1pm.  Reviewed instructions.  Pt will come in today to pick up instructions and have blood work today.

## 2014-05-31 NOTE — Telephone Encounter (Signed)
Follow up ° ° ° ° °Returning a nurses call °

## 2014-05-31 NOTE — Telephone Encounter (Signed)
Spoke with pt who is aware of results.  He continued to have symptoms of SOB with walking up stairs or a hill.  He is aware Dr Tamala Julian ordered for him to have a cardiac cath to further assess the reason for SOB on exertion.  Pt states understanding and is agreeable.  Will schedule testing and call him back with date, time and instructions.

## 2014-06-01 ENCOUNTER — Other Ambulatory Visit: Payer: Self-pay | Admitting: Interventional Cardiology

## 2014-06-01 DIAGNOSIS — I209 Angina pectoris, unspecified: Secondary | ICD-10-CM

## 2014-06-04 ENCOUNTER — Encounter (HOSPITAL_COMMUNITY): Payer: Self-pay | Admitting: Pharmacy Technician

## 2014-06-04 ENCOUNTER — Telehealth: Payer: Self-pay | Admitting: Interventional Cardiology

## 2014-06-04 NOTE — Telephone Encounter (Signed)
New message     Pt is having a cath tomorrow and need to know what he can and cannot do

## 2014-06-04 NOTE — Telephone Encounter (Signed)
Patient is returning your call. Please call back.  °

## 2014-06-04 NOTE — Telephone Encounter (Signed)
returned pt call. lmtcb 

## 2014-06-05 ENCOUNTER — Ambulatory Visit (HOSPITAL_COMMUNITY)
Admission: RE | Admit: 2014-06-05 | Discharge: 2014-06-05 | Disposition: A | Discharge status: Home / Self Care | Payer: BC Managed Care – PPO | Source: Ambulatory Visit | Attending: Interventional Cardiology | Admitting: Interventional Cardiology

## 2014-06-05 ENCOUNTER — Telehealth: Payer: Self-pay | Admitting: Interventional Cardiology

## 2014-06-05 ENCOUNTER — Encounter (HOSPITAL_COMMUNITY)
Admission: RE | Disposition: A | Discharge status: Home / Self Care | Payer: Self-pay | Source: Ambulatory Visit | Attending: Interventional Cardiology

## 2014-06-05 DIAGNOSIS — I10 Essential (primary) hypertension: Secondary | ICD-10-CM | POA: Insufficient documentation

## 2014-06-05 DIAGNOSIS — I2510 Atherosclerotic heart disease of native coronary artery without angina pectoris: Secondary | ICD-10-CM

## 2014-06-05 DIAGNOSIS — I059 Rheumatic mitral valve disease, unspecified: Secondary | ICD-10-CM | POA: Diagnosis not present

## 2014-06-05 DIAGNOSIS — Z7982 Long term (current) use of aspirin: Secondary | ICD-10-CM | POA: Insufficient documentation

## 2014-06-05 DIAGNOSIS — E785 Hyperlipidemia, unspecified: Secondary | ICD-10-CM | POA: Diagnosis not present

## 2014-06-05 DIAGNOSIS — I2584 Coronary atherosclerosis due to calcified coronary lesion: Secondary | ICD-10-CM | POA: Insufficient documentation

## 2014-06-05 DIAGNOSIS — I209 Angina pectoris, unspecified: Secondary | ICD-10-CM

## 2014-06-05 DIAGNOSIS — R0789 Other chest pain: Secondary | ICD-10-CM | POA: Diagnosis present

## 2014-06-05 DIAGNOSIS — Z87891 Personal history of nicotine dependence: Secondary | ICD-10-CM | POA: Insufficient documentation

## 2014-06-05 DIAGNOSIS — Z79899 Other long term (current) drug therapy: Secondary | ICD-10-CM | POA: Diagnosis not present

## 2014-06-05 DIAGNOSIS — G4733 Obstructive sleep apnea (adult) (pediatric): Secondary | ICD-10-CM | POA: Diagnosis not present

## 2014-06-05 DIAGNOSIS — I4589 Other specified conduction disorders: Secondary | ICD-10-CM | POA: Insufficient documentation

## 2014-06-05 DIAGNOSIS — E119 Type 2 diabetes mellitus without complications: Secondary | ICD-10-CM | POA: Insufficient documentation

## 2014-06-05 DIAGNOSIS — R0609 Other forms of dyspnea: Secondary | ICD-10-CM

## 2014-06-05 DIAGNOSIS — G47419 Narcolepsy without cataplexy: Secondary | ICD-10-CM | POA: Diagnosis not present

## 2014-06-05 DIAGNOSIS — R0989 Other specified symptoms and signs involving the circulatory and respiratory systems: Secondary | ICD-10-CM

## 2014-06-05 HISTORY — PX: LEFT AND RIGHT HEART CATHETERIZATION WITH CORONARY ANGIOGRAM: SHX5449

## 2014-06-05 LAB — GLUCOSE, CAPILLARY: GLUCOSE-CAPILLARY: 149 mg/dL — AB (ref 70–99)

## 2014-06-05 SURGERY — LEFT AND RIGHT HEART CATHETERIZATION WITH CORONARY ANGIOGRAM
Anesthesia: LOCAL

## 2014-06-05 MED ORDER — SODIUM CHLORIDE 0.9 % IJ SOLN: 3.0000 mL | INTRAMUSCULAR | Status: DC | PRN

## 2014-06-05 MED ORDER — SODIUM CHLORIDE 0.9 % IV SOLN: 1.0000 mL/kg/h | INTRAVENOUS | Status: DC

## 2014-06-05 MED ORDER — NITROGLYCERIN 1 MG/10 ML FOR IR/CATH LAB
INTRA_ARTERIAL | Status: AC
  Filled 2014-06-05: qty 10

## 2014-06-05 MED ORDER — MIDAZOLAM HCL 2 MG/2ML IJ SOLN
INTRAMUSCULAR | Status: AC
  Filled 2014-06-05: qty 2

## 2014-06-05 MED ORDER — OXYCODONE-ACETAMINOPHEN 5-325 MG PO TABS: 1.0000 | ORAL_TABLET | ORAL | Status: DC | PRN

## 2014-06-05 MED ORDER — HEPARIN (PORCINE) IN NACL 2-0.9 UNIT/ML-% IJ SOLN
INTRAMUSCULAR | Status: AC
  Filled 2014-06-05: qty 1000

## 2014-06-05 MED ORDER — FENTANYL CITRATE 0.05 MG/ML IJ SOLN
INTRAMUSCULAR | Status: AC
  Filled 2014-06-05: qty 2

## 2014-06-05 MED ORDER — ACETAMINOPHEN 325 MG PO TABS: 650.0000 mg | ORAL_TABLET | ORAL | Status: DC | PRN

## 2014-06-05 MED ORDER — ASPIRIN 81 MG PO CHEW
CHEWABLE_TABLET | ORAL | Status: AC
  Filled 2014-06-05: qty 1

## 2014-06-05 MED ORDER — ASPIRIN 81 MG PO CHEW: 81.0000 mg | CHEWABLE_TABLET | ORAL | Status: DC

## 2014-06-05 MED ORDER — LIDOCAINE HCL (PF) 1 % IJ SOLN
INTRAMUSCULAR | Status: AC
  Filled 2014-06-05: qty 30

## 2014-06-05 MED ORDER — SODIUM CHLORIDE 0.9 % IV SOLN
INTRAVENOUS | Status: DC
  Administered 2014-06-05: 13:00:00 via INTRAVENOUS

## 2014-06-05 MED ORDER — SODIUM CHLORIDE 0.9 % IJ SOLN
3.0000 mL | Freq: Two times a day (BID) | INTRAMUSCULAR | Status: DC
Start: 2014-06-05 — End: 2014-06-05

## 2014-06-05 MED ORDER — ONDANSETRON HCL 4 MG/2ML IJ SOLN: 4.0000 mg | Freq: Four times a day (QID) | INTRAMUSCULAR | Status: DC | PRN

## 2014-06-05 MED ORDER — VERAPAMIL HCL 2.5 MG/ML IV SOLN
INTRAVENOUS | Status: AC
  Filled 2014-06-05: qty 2

## 2014-06-05 MED ORDER — HEPARIN SODIUM (PORCINE) 1000 UNIT/ML IJ SOLN
INTRAMUSCULAR | Status: AC
  Filled 2014-06-05: qty 1

## 2014-06-05 MED ORDER — SODIUM CHLORIDE 0.9 % IV SOLN: 250.0000 mL | INTRAVENOUS | Status: DC | PRN

## 2014-06-05 NOTE — Telephone Encounter (Signed)
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

## 2014-06-05 NOTE — Interval H&P Note (Signed)
Cath Lab Visit (complete for each Cath Lab visit)  Clinical Evaluation Leading to the Procedure:   ACS: No.  Non-ACS:    Anginal Classification: CCS III  Anti-ischemic medical therapy: Minimal Therapy (1 class of medications)  Non-Invasive Test Results: Low-risk stress test findings: cardiac mortality <1%/year  Prior CABG: No previous CABG      History and Physical Interval Note:  06/05/2014 12:59 PM  [name]  has presented today for surgery, with the diagnosis of sob, cad  The various methods of treatment have been discussed with the patient and family. After consideration of risks, benefits and other options for treatment, the patient has consented to  Procedure(s): LEFT AND RIGHT HEART CATHETERIZATION WITH CORONARY ANGIOGRAM (N/A) as a surgical intervention .  The patient's history has been reviewed, patient examined, no change in status, stable for surgery.  I have reviewed the patient's chart and labs.  Questions were answered to the patient's satisfaction.     Sinclair Grooms

## 2014-06-05 NOTE — Discharge Instructions (Signed)
NO METFORMIN/GLUCOPHAGE FOR 2 DAYS   Radial Site Care Refer to this sheet in the next few weeks. These instructions provide you with information on caring for yourself after your procedure. Your caregiver may also give you more specific instructions. Your treatment has been planned according to current medical practices, but problems sometimes occur. Call your caregiver if you have any problems or questions after your procedure. HOME CARE INSTRUCTIONS  You may shower the day after the procedure.Remove the bandage (dressing) and gently wash the site with plain soap and water.Gently pat the site dry.  Do not apply powder or lotion to the site.  Do not submerge the affected site in water for 3 to 5 days.  Inspect the site at least twice daily.  Do not flex or bend the affected arm for 24 hours.  No lifting over 5 pounds (2.3 kg) for 5 days after your procedure.  Do not drive home if you are discharged the same day of the procedure. Have someone else drive you.  You may drive 24 hours after the procedure unless otherwise instructed by your caregiver.  Do not operate machinery or power tools for 24 hours.  A responsible adult should be with you for the first 24 hours after you arrive home. What to expect:  Any bruising will usually fade within 1 to 2 weeks.  Blood that collects in the tissue (hematoma) may be painful to the touch. It should usually decrease in size and tenderness within 1 to 2 weeks. SEEK IMMEDIATE MEDICAL CARE IF:  You have unusual pain at the radial site.  You have redness, warmth, swelling, or pain at the radial site.  You have drainage (other than a small amount of blood on the dressing).  You have chills.  You have a fever or persistent symptoms for more than 72 hours.  You have a fever and your symptoms suddenly get worse.  Your arm becomes pale, cool, tingly, or numb.  You have heavy bleeding from the site. Hold pressure on the site. Document  Released: 11/20/2010 Document Revised: 01/10/2012 Document Reviewed: 11/20/2010 [facility] Patient Information 2015 [place], [place]. This information is not intended to replace advice given to you by your health care provider. Make sure you discuss any questions you have with your health care provider.

## 2014-06-05 NOTE — Telephone Encounter (Signed)
spoke with pt. pt given verbal preprocedure cardiac cath instructions and verbalized understanding.

## 2014-06-05 NOTE — H&P (Signed)
Jordan [name] is a 73 y.o. male  Admit Date: 06/05/2014 Referring Physician: [name] [name], M.D. Primary Cardiologist: Same Primary Physician: [name], M.D. Chief complaint / reason for admission: Progressive exertional dyspnea, chest tightness, and fatigue in the setting of a low risk myocardial perfusion study and an abnormal 24-hour Holter with no heart rate greater than 79 beats per minute. The patient has known coronary artery disease with last catheterization demonstrating in 2001 demonstrating 30-40% distal left main 50% proximal circumflex 85% distal LAD.  HPI: 73 year old gentleman with progressive reduction in activity level due to fatigue and dyspnea. A recent myocardial perfusion study was "low risk". Holter monitoring demonstrated evidence of sinus node dysfunction with no heart rate greater than 79 beats per minute. The patient is on beta blocker therapy. He's had occasional tightness in his chest. His physical activity has markedly diminished because of these limitations. He is undergoing coronary angiography to exclude the possibility of matched ischemia thus giving a low risk myocardial perfusion study (i.e. false-negative). 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 Coronary heart disease     Dr Pernell Dupre  . Hyperlipidemia   . OSA (obstructive sleep apnea)     NPSG 12-13-98 AHI 22.7  . 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 Complication of anesthesia   . PONV (postoperative nausea and vomiting)   . Shortness of breath     with exertion   . Diabetes mellitus without complication   . Arthritis     R shoulder, bone spur  . H/O cardiovascular stress test     perhaps last one was 2009  . Hypertension     saw Dr. Linard Millers last earl;y- 2014, cardiac cath. last done ?2009, blocks seen  didn't require any intervention at that point.  [name] Heart murmur     12/29/11 echo: mild MR, no AS, trivial TR    PSH:    Past Surgical History  Procedure Laterality Date  . Tonsillectomy    . Nasal fracture surgery    . Cardiac catheterization  2009?  [name] Shoulder arthroscopy with rotator cuff repair and subacromial decompression Right 08/16/2013    Procedure: SHOULDER ARTHROSCOPY WITH ROTATOR CUFF REPAIR AND SUBACROMIAL DECOMPRESSION;  Surgeon: [name], MD;  Location: [facility];  Service: Orthopedics;  Laterality: Right;  Right shoulder arthroscopy rotator cuff repair, subacromial decompression.   ALLERGIES:   Review of patient's allergies indicates no known allergies. Prior to Admit Meds:   Prescriptions prior to admission  Medication Sig Dispense Refill  . amphetamine-dextroamphetamine (ADDERALL) 10 MG tablet Take 10-30 mg by mouth daily. Takes 10mg  every morning.  May take an additional 10mg  up to twice daily.      [name] aspirin EC 81 MG tablet Take 81 mg by mouth daily.      [name] atorvastatin (LIPITOR) 40 MG tablet Take 20 mg by mouth daily before breakfast.       . lisinopril (PRINIVIL,ZESTRIL) 40 MG tablet Take 1 tablet (40 mg total) by mouth daily before breakfast.  90 tablet  0  . metFORMIN (GLUCOPHAGE) 500 MG tablet Take 1 tablet by mouth daily before breakfast.       . Multiple Vitamin (MULTIVITAMIN) tablet Take 1 tablet by mouth daily.      [name]  Omega-3 Fatty Acids (FISH OIL) 1000 MG CAPS Take 3 capsules by mouth daily.       . nitroGLYCERIN (NITROSTAT) 0.4 MG SL tablet Place 1 tablet (0.4 mg total) under the tongue every 5 (five) minutes as needed for chest pain.  25 tablet  0   Family HX:    Family History  Problem Relation Age of Onset  . Sleep apnea Sister   . Colon cancer Sister    Social HX:    History   Social History  . Marital Status: Married    Spouse Name: N/A    Number of Children: N/A  . Years of Education: N/A   Occupational History  . 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 [place] [employer]    Social History Main Topics  . Smoking status: Former Smoker -- 2.50 packs/day    Types: Cigarettes    Quit date: 06/05/1967  . Smokeless tobacco: Not on file  . Alcohol Use: Yes     Comment: "not often", not regular, on occasion   . Drug Use: No  . Sexual Activity: Not on file   Other Topics Concern  . Not on file   Social History Narrative  . No narrative on file     ROS: No blood in his urine or stool. He denies syncope although he has frequent dizzy spells. No palpitations. His two-pillow orthopnea. Mild lower extremity edema. No neurological complaints.  Physical Exam: Blood pressure 148/72, pulse 57, temperature 98.2 F (36.8 C), temperature source Oral, resp. rate 18, height 5\' 7"  (1.702 m), weight 244 lb (110.678 kg), SpO2 96.00%.   Somewhat pale appearing but in no distress HEENT exam is unremarkable without evidence of jaundice Neck exam reveals moderate JVD but no carotid bruits Chest is clear to auscultation and percussion Cardiac exam reveals an S4 gallop and a 1-2 of 6 systolic murmur right upper sternal border. Abdomen is obese. No pulsatile masses are noted. No tenderness is noted. Extremities reveal no edema. Pulses are 2+ and symmetric in the femoral and posterior tibial bilaterally Neurologically patient is intact. He has a  bland affect. Labs: Lab Results  Component Value Date   WBC 7.6 05/31/2014   HGB 14.1 05/31/2014   HCT 42.7 05/31/2014   MCV 88.7 05/31/2014   PLT 247.0 05/31/2014    Recent Labs Lab 05/31/14 1427  NA 139  K 4.0  CL 107  CO2 26  BUN 20  CREATININE 0.9  CALCIUM 9.4  GLUCOSE 110*     Radiology:  No results found.  EKG:  Sinus bradycardia with left anterior hemiblock on 06/05/14  ASSESSMENT: 1. Patient with a known history of coronary artery disease and progressive symptoms of dyspnea and chest tightness. Recent myocardial perfusion study was "low risk". I am concerned about the possibility  of matched ischemia. An alternative explanation for the patient's symptoms his chronotropic incompetence. If coronary angiography does not demonstrate severe three-vessel coronary disease he will likely need to proceed with permanent pacemaker therapy.  2. Chronotropic incompetence  3. Hypertension  Plan: The patient will undergo left and right heart catheterization with coronary angiography to help [place] determine the source of his exertional dyspnea and chest tightness. In 2001 he was documented to have moderate coronary disease. The procedure and risks including stroke, death, myocardial infarction, bleeding, allergy, kidney injury, among others were discussed in detail with the patient and accepted. Plans discharge later today unless left main or severe three-vessel coronary disease is identified. Tamala Julian Daiva Eves 06/05/2014  12:25 PM

## 2014-06-05 NOTE — CV Procedure (Signed)
Left and Right Heart Catheterization with Coronary Angiography  Report  Jordan [name]  73 y.o.  male 1941/05/06  Procedure Date: 06/05/2014 Referring Physician: HWB [name], M.D. Primary Cardiologist: Same  INDICATIONS: Exertional dyspnea/chest tightness with progressive decrease in exertional tolerance. Study is being done to definitively exclude the possibility of coronary disease and to assess pulmonary artery pressures.  PROCEDURE: 1. Left heart catheterization; 2. Right heart catheterization; 3. Coronary angiography; 4. Left ventriculography  CONSENT:  The risks, benefits, and details of the procedure were explained in detail to the patient. Risks including death, stroke, heart attack, kidney injury, allergy, limb ischemia, bleeding and radiation injury were discussed.  The patient verbalized understanding and wanted to proceed.  Informed written consent was obtained.  PROCEDURE TECHNIQUE:  After Xylocaine anesthesia a 5 French Slender sheath was placed in the right radial artery with an angiocath and the modified Seldinger technique.  A 5 French brachial sheath was placed in the right antecubital vein in exchange for an 18-gauge Angiocath using the modified Seldinger technique. A 5 French balloon tip catheter was then used to attempt right heart pressure recordings. The catheter was advanced to the subclavian but even using a angioplasty guidewire within dear the catheter into the right atrium. There was significant tortuosity. Contrast angiography was not performed. The question of right subclavian occlusion vein is raised. I did not perform right heart catheterization from the right femoral vein approach his been already administered IV heparin and wanted to decrease her risk of having hematoma formation. We therefore aborted further attempts at right heart cath. Coronary angiography was done using a 5 F JR 4 and JL 3.5 catheter.  Left ventriculography was done using the JR 4  catheter and hand injection.   The digital images were reviewed and no significant coronary obstruction was noted.   CONTRAST:  Total of 90 cc.  COMPLICATIONS:  None   HEMODYNAMICS:  Aortic pressure 98/53 mmHg; LV pressure 104/0 mmHg; LVEDP 8 mm mercury; RA not obtained; RV not obtained; PA not obtained; PCWP(mean) not obtained; Cardiac Output  Not obtained; AV gradient not obtained  ANGIOGRAPHIC DATA:   The left main coronary artery is calcified but widely patent.  The left anterior descending artery is heavily calcified but widely patent throughout its course. The proximal and mid vessel contains irregularities but no focal high-grade obstruction.  The ramus intermedius is widely patent and is moderate in size. Moderate nonobstructive plaque is noted in the proximal and mid vessel.  The left circumflex artery is widely patent and as a large second obtuse marginal branches into 2 moderate-sized vessels. The first obtuse marginal was small..  The right coronary artery is dominant giving 3 left ventricular branches and PDA. Moderate plaque with up to 30% narrowing proximally and distally is noted.  LEFT VENTRICULOGRAM:  Left ventricular angiogram was done in the 30 RAO projection and revealed normal sinus cavity with normal contractility and an estimated EF of 60%   IMPRESSIONS:  1. Three-vessel coronary calcification but with no evidence of significant obstructive disease. In fact when compared to prior angiogram data, obstructive disease has regressed. 2. Normal systolic function without evidence of diastolic dysfunction based upon end-diastolic pressures. 3. Failed attempt to perform right heart catheterization from the right antecubital vein do to possible occlusion of the right subclavian vein/innominate.   RECOMMENDATION:  Will progress to an electrophysiology consult to determine if rate responsive pacemaker will help improve the patient's exertional tolerance and dyspnea. He has  evidence of chronotropic incompetence based upon a recent Holter.[name]

## 2014-06-06 NOTE — Telephone Encounter (Signed)
Needs EP consult

## 2014-06-06 NOTE — Telephone Encounter (Signed)
pt aware of Dr.Smith recommendations.pt needs a EP Consult appointment to consider DDD rate responsive pacer due to chronotropic incompetence. pt verbalized understanding.

## 2014-06-27 DIAGNOSIS — M9981 Other biomechanical lesions of cervical region: Secondary | ICD-10-CM | POA: Diagnosis not present

## 2014-06-27 DIAGNOSIS — M999 Biomechanical lesion, unspecified: Secondary | ICD-10-CM | POA: Diagnosis not present

## 2014-06-27 DIAGNOSIS — M5137 Other intervertebral disc degeneration, lumbosacral region: Secondary | ICD-10-CM | POA: Diagnosis not present

## 2014-07-09 ENCOUNTER — Ambulatory Visit (INDEPENDENT_AMBULATORY_CARE_PROVIDER_SITE_OTHER): Payer: BC Managed Care – PPO | Admitting: Internal Medicine

## 2014-07-09 ENCOUNTER — Encounter: Payer: Self-pay | Admitting: Internal Medicine

## 2014-07-09 VITALS — BP 136/78 | HR 56 | Ht 67.0 in | Wt 246.4 lb

## 2014-07-09 DIAGNOSIS — I4589 Other specified conduction disorders: Secondary | ICD-10-CM

## 2014-07-09 NOTE — Progress Notes (Signed)
HPI Jordan [name] is referred today by Dr. Tamala Julian for evaluation of chronotropic incompetence. He is a pleasant 73 yo man with obesity, HTN, and DM. He was found to have very marked chronotropic incompetence on 24 hour holter monitoring. He has preserved LV function by echo and non-obstructive CAD by cath. The patient exercises daily. He is limited on the treadmill by not being able to go for over 59mph. He rides a stationary bike. No syncope or near syncope. No Known Allergies   Current Outpatient Prescriptions  Medication Sig Dispense Refill  . amphetamine-dextroamphetamine (ADDERALL) 10 MG tablet Take 10-30 mg by mouth daily. Takes 10mg  every morning.  May take an additional 10mg  up to twice daily.      [name] aspirin EC 81 MG tablet Take 81 mg by mouth daily.      [name] atorvastatin (LIPITOR) 40 MG tablet Take 20 mg by mouth daily before breakfast.       . lisinopril (PRINIVIL,ZESTRIL) 40 MG tablet Take 1 tablet (40 mg total) by mouth daily before breakfast.  90 tablet  0  . metFORMIN (GLUCOPHAGE) 500 MG tablet Take 1 tablet by mouth daily before breakfast.       . Multiple Vitamin (MULTIVITAMIN) tablet Take 1 tablet by mouth daily.      . nitroGLYCERIN (NITROSTAT) 0.4 MG SL tablet Place 1 tablet (0.4 mg total) under the tongue every 5 (five) minutes as needed for chest pain.  25 tablet  0  . Omega-3 Fatty Acids (FISH OIL) 1000 MG CAPS Take 6 capsules by mouth daily.        No current facility-administered medications for this visit.     Past Medical History  Diagnosis Date  . Coronary heart disease     Dr Pernell Dupre  . Hyperlipidemia   . OSA (obstructive sleep apnea)     NPSG 12-13-98 AHI 22.7  . 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 Complication of anesthesia   . PONV (postoperative nausea and vomiting)   . Shortness of breath     with exertion   . Diabetes mellitus without complication   . Arthritis     R shoulder, bone spur  . H/O cardiovascular stress  test     perhaps last one was 2009  . Hypertension     saw Dr. Linard Millers last earl;y- 2014, cardiac cath. last done ?2009, blocks seen didn't require any intervention at that point.  [name] Heart murmur     12/29/11 echo: mild MR, no AS, trivial TR    ROS:   All systems reviewed and negative except as noted in the HPI.   Past Surgical History  Procedure Laterality Date  . Tonsillectomy    . Nasal fracture surgery    . Cardiac catheterization  2009?  [name] Shoulder arthroscopy with rotator cuff repair and subacromial decompression Right 08/16/2013    Procedure: SHOULDER ARTHROSCOPY WITH ROTATOR CUFF REPAIR AND SUBACROMIAL DECOMPRESSION;  Surgeon: [name], MD;  Location: [facility];  Service: Orthopedics;  Laterality: Right;  Right shoulder arthroscopy rotator cuff repair, subacromial decompression.     Family History  Problem Relation Age of Onset  . Sleep apnea Sister   . Colon cancer Sister      History   Social History  . Marital Status: Married    Spouse Name: N/A    Number of Children: N/A  . Years of Education: N/A   Occupational History  .  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 [place] [employer]    Social History Main Topics  . Smoking status: Former Smoker -- 2.50 packs/day    Types: Cigarettes    Quit date: 06/05/1967  . Smokeless tobacco: Not on file  . Alcohol Use: Yes     Comment: "not often", not regular, on occasion   . Drug Use: No  . Sexual Activity: Not on file   Other Topics Concern  . Not on file   Social History Narrative  . No narrative on file     BP 136/78  Pulse 56  Ht 5\' 7"  (1.702 m)  Wt 246 lb 6.4 oz (111.766 kg)  BMI 38.58 kg/m2  Physical Exam:  Well appearing 73 yo man, NAD HEENT: Unremarkable Neck:  No JVD, no thyromegally Back:  No CVA tenderness Lungs:  Clear with no wheezes HEART:  Regular brady rhythm, no murmurs, no rubs, no clicks Abd:  soft, positive bowel sounds, no organomegally, no rebound, no guarding Ext:  2 plus  pulses, no edema, no cyanosis, no clubbing Skin:  No rashes no nodules Neuro:  CN II through XII intact, motor grossly intact  Pulse ox on walking demonstrates a maximum HR of 60/min.  Assess/Plan:

## 2014-07-09 NOTE — Assessment & Plan Note (Signed)
The patient has fairly clear chronotropic incompetence. What is not clear is whether he is symptomatic and whether PPM with rate response would help give him more energy. He walked around in the halls with me today and did not raise his heart rate. He was asymptomatic however. For all of the above, I have recommended watchful waiting. He will refrain from any sinus nodal blocking drugs.

## 2014-07-09 NOTE — Patient Instructions (Signed)
Your physician wants you to follow-up in: 12 months with Dr. Taylor. You will receive a reminder letter in the mail two months in advance. If you don't receive a letter, please call our office to schedule the follow-up appointment.    

## 2014-07-17 DIAGNOSIS — Z23 Encounter for immunization: Secondary | ICD-10-CM | POA: Diagnosis not present

## 2014-07-29 DIAGNOSIS — M5137 Other intervertebral disc degeneration, lumbosacral region: Secondary | ICD-10-CM | POA: Diagnosis not present

## 2014-07-29 DIAGNOSIS — M9981 Other biomechanical lesions of cervical region: Secondary | ICD-10-CM | POA: Diagnosis not present

## 2014-07-29 DIAGNOSIS — M999 Biomechanical lesion, unspecified: Secondary | ICD-10-CM | POA: Diagnosis not present

## 2014-07-30 DIAGNOSIS — M9981 Other biomechanical lesions of cervical region: Secondary | ICD-10-CM | POA: Diagnosis not present

## 2014-07-30 DIAGNOSIS — M999 Biomechanical lesion, unspecified: Secondary | ICD-10-CM | POA: Diagnosis not present

## 2014-07-30 DIAGNOSIS — M5137 Other intervertebral disc degeneration, lumbosacral region: Secondary | ICD-10-CM | POA: Diagnosis not present

## 2014-07-31 DIAGNOSIS — M5137 Other intervertebral disc degeneration, lumbosacral region: Secondary | ICD-10-CM | POA: Diagnosis not present

## 2014-07-31 DIAGNOSIS — M9981 Other biomechanical lesions of cervical region: Secondary | ICD-10-CM | POA: Diagnosis not present

## 2014-07-31 DIAGNOSIS — M999 Biomechanical lesion, unspecified: Secondary | ICD-10-CM | POA: Diagnosis not present

## 2014-08-01 DIAGNOSIS — M5134 Other intervertebral disc degeneration, thoracic region: Secondary | ICD-10-CM | POA: Diagnosis not present

## 2014-08-01 DIAGNOSIS — M9908 Segmental and somatic dysfunction of rib cage: Secondary | ICD-10-CM | POA: Diagnosis not present

## 2014-08-01 DIAGNOSIS — M456 Ankylosing spondylitis lumbar region: Secondary | ICD-10-CM | POA: Diagnosis not present

## 2014-08-01 DIAGNOSIS — M9902 Segmental and somatic dysfunction of thoracic region: Secondary | ICD-10-CM | POA: Diagnosis not present

## 2014-08-19 DIAGNOSIS — M456 Ankylosing spondylitis lumbar region: Secondary | ICD-10-CM | POA: Diagnosis not present

## 2014-08-19 DIAGNOSIS — M9908 Segmental and somatic dysfunction of rib cage: Secondary | ICD-10-CM | POA: Diagnosis not present

## 2014-08-19 DIAGNOSIS — M9902 Segmental and somatic dysfunction of thoracic region: Secondary | ICD-10-CM | POA: Diagnosis not present

## 2014-08-19 DIAGNOSIS — M5134 Other intervertebral disc degeneration, thoracic region: Secondary | ICD-10-CM | POA: Diagnosis not present

## 2014-08-21 ENCOUNTER — Other Ambulatory Visit: Payer: Self-pay | Admitting: Interventional Cardiology

## 2014-09-05 DIAGNOSIS — M9902 Segmental and somatic dysfunction of thoracic region: Secondary | ICD-10-CM | POA: Diagnosis not present

## 2014-09-05 DIAGNOSIS — M5134 Other intervertebral disc degeneration, thoracic region: Secondary | ICD-10-CM | POA: Diagnosis not present

## 2014-09-05 DIAGNOSIS — M9908 Segmental and somatic dysfunction of rib cage: Secondary | ICD-10-CM | POA: Diagnosis not present

## 2014-09-05 DIAGNOSIS — M456 Ankylosing spondylitis lumbar region: Secondary | ICD-10-CM | POA: Diagnosis not present

## 2014-09-10 ENCOUNTER — Telehealth: Payer: Self-pay | Admitting: Internal Medicine

## 2014-09-10 MED ORDER — AMPHETAMINE-DEXTROAMPHETAMINE 10 MG PO TABS: 10.0000 mg | ORAL_TABLET | Freq: Every day | ORAL | Status: DC

## 2014-09-10 NOTE — Telephone Encounter (Signed)
Pt returned call - [phone]

## 2014-09-10 NOTE — Telephone Encounter (Signed)
Called and spoke to pt. Informed pt of the rx is ready for pick up. Pt verbalized understanding and denied any further questions or concerns at this time.

## 2014-09-10 NOTE — Telephone Encounter (Signed)
CY please advise of the quantity of the adderall that you would like to give the pt.  CY please advise. Thanks  No Known Allergies  Current Outpatient Prescriptions on File Prior to Visit  Medication Sig Dispense Refill  . amphetamine-dextroamphetamine (ADDERALL) 10 MG tablet Take 10-30 mg by mouth daily. Takes 10mg  every morning.  May take an additional 10mg  up to twice daily.    [name] aspirin EC 81 MG tablet Take 81 mg by mouth daily.    [name] atorvastatin (LIPITOR) 40 MG tablet Take 20 mg by mouth daily before breakfast.     . lisinopril (PRINIVIL,ZESTRIL) 40 MG tablet TAKE 1 TABLET BY MOUTH EVERY DAY BEFORE BREAKFAST 90 tablet 1  . metFORMIN (GLUCOPHAGE) 500 MG tablet Take 1 tablet by mouth daily before breakfast.     . Multiple Vitamin (MULTIVITAMIN) tablet Take 1 tablet by mouth daily.    . nitroGLYCERIN (NITROSTAT) 0.4 MG SL tablet Place 1 tablet (0.4 mg total) under the tongue every 5 (five) minutes as needed for chest pain. 25 tablet 0  . Omega-3 Fatty Acids (FISH OIL) 1000 MG CAPS Take 6 capsules by mouth daily.      No current facility-administered medications on file prior to visit.

## 2014-09-10 NOTE — Telephone Encounter (Signed)
lmomtcb x 1  To make pt aware that his rx is up front and ready to be picked up.

## 2014-09-10 NOTE — Telephone Encounter (Signed)
Adderall 10 mg, # 90, 1-3 tabs daily as directed, NR

## 2014-09-11 DIAGNOSIS — M9902 Segmental and somatic dysfunction of thoracic region: Secondary | ICD-10-CM | POA: Diagnosis not present

## 2014-09-11 DIAGNOSIS — M456 Ankylosing spondylitis lumbar region: Secondary | ICD-10-CM | POA: Diagnosis not present

## 2014-09-11 DIAGNOSIS — M5134 Other intervertebral disc degeneration, thoracic region: Secondary | ICD-10-CM | POA: Diagnosis not present

## 2014-09-11 DIAGNOSIS — M9908 Segmental and somatic dysfunction of rib cage: Secondary | ICD-10-CM | POA: Diagnosis not present

## 2014-09-12 DIAGNOSIS — M5134 Other intervertebral disc degeneration, thoracic region: Secondary | ICD-10-CM | POA: Diagnosis not present

## 2014-09-12 DIAGNOSIS — M9902 Segmental and somatic dysfunction of thoracic region: Secondary | ICD-10-CM | POA: Diagnosis not present

## 2014-09-12 DIAGNOSIS — M9908 Segmental and somatic dysfunction of rib cage: Secondary | ICD-10-CM | POA: Diagnosis not present

## 2014-09-12 DIAGNOSIS — M456 Ankylosing spondylitis lumbar region: Secondary | ICD-10-CM | POA: Diagnosis not present

## 2014-10-01 DIAGNOSIS — M9908 Segmental and somatic dysfunction of rib cage: Secondary | ICD-10-CM | POA: Diagnosis not present

## 2014-10-01 DIAGNOSIS — M5134 Other intervertebral disc degeneration, thoracic region: Secondary | ICD-10-CM | POA: Diagnosis not present

## 2014-10-01 DIAGNOSIS — M9902 Segmental and somatic dysfunction of thoracic region: Secondary | ICD-10-CM | POA: Diagnosis not present

## 2014-10-01 DIAGNOSIS — M456 Ankylosing spondylitis lumbar region: Secondary | ICD-10-CM | POA: Diagnosis not present

## 2014-10-04 DIAGNOSIS — M9902 Segmental and somatic dysfunction of thoracic region: Secondary | ICD-10-CM | POA: Diagnosis not present

## 2014-10-04 DIAGNOSIS — M9908 Segmental and somatic dysfunction of rib cage: Secondary | ICD-10-CM | POA: Diagnosis not present

## 2014-10-04 DIAGNOSIS — M456 Ankylosing spondylitis lumbar region: Secondary | ICD-10-CM | POA: Diagnosis not present

## 2014-10-04 DIAGNOSIS — M5134 Other intervertebral disc degeneration, thoracic region: Secondary | ICD-10-CM | POA: Diagnosis not present

## 2014-10-10 ENCOUNTER — Encounter (HOSPITAL_COMMUNITY): Payer: Self-pay | Admitting: Interventional Cardiology

## 2014-10-23 DIAGNOSIS — M9902 Segmental and somatic dysfunction of thoracic region: Secondary | ICD-10-CM | POA: Diagnosis not present

## 2014-10-23 DIAGNOSIS — M456 Ankylosing spondylitis lumbar region: Secondary | ICD-10-CM | POA: Diagnosis not present

## 2014-10-23 DIAGNOSIS — M9908 Segmental and somatic dysfunction of rib cage: Secondary | ICD-10-CM | POA: Diagnosis not present

## 2014-10-23 DIAGNOSIS — M5134 Other intervertebral disc degeneration, thoracic region: Secondary | ICD-10-CM | POA: Diagnosis not present

## 2014-11-04 DIAGNOSIS — M9908 Segmental and somatic dysfunction of rib cage: Secondary | ICD-10-CM | POA: Diagnosis not present

## 2014-11-04 DIAGNOSIS — M9902 Segmental and somatic dysfunction of thoracic region: Secondary | ICD-10-CM | POA: Diagnosis not present

## 2014-11-04 DIAGNOSIS — M5134 Other intervertebral disc degeneration, thoracic region: Secondary | ICD-10-CM | POA: Diagnosis not present

## 2014-11-04 DIAGNOSIS — M456 Ankylosing spondylitis lumbar region: Secondary | ICD-10-CM | POA: Diagnosis not present

## 2014-11-08 ENCOUNTER — Telehealth: Payer: Self-pay | Admitting: Interventional Cardiology

## 2014-11-08 NOTE — Telephone Encounter (Signed)
Will forward to [employer].

## 2014-11-08 NOTE — Telephone Encounter (Signed)
New Message         Pt calling stating that he needs a written statement stating his diagnosis, pt needs this material to file with [facility]. Pt states he spoke with medical records and was told they wouldn't be able to provide that information for him. Please call back and advise.

## 2014-11-11 NOTE — Telephone Encounter (Signed)
Returned pt call.lmtcb 

## 2014-11-12 DIAGNOSIS — M9908 Segmental and somatic dysfunction of rib cage: Secondary | ICD-10-CM | POA: Diagnosis not present

## 2014-11-12 DIAGNOSIS — M456 Ankylosing spondylitis lumbar region: Secondary | ICD-10-CM | POA: Diagnosis not present

## 2014-11-12 DIAGNOSIS — M9902 Segmental and somatic dysfunction of thoracic region: Secondary | ICD-10-CM | POA: Diagnosis not present

## 2014-11-12 DIAGNOSIS — M5134 Other intervertebral disc degeneration, thoracic region: Secondary | ICD-10-CM | POA: Diagnosis not present

## 2014-11-13 NOTE — Telephone Encounter (Signed)
F/u ° ° °Pt returning your call °

## 2014-11-13 NOTE — Telephone Encounter (Signed)
Returned pt call. Will mail copy of last o/v with Dr.Smith for him to take with him to the [place]

## 2014-11-14 ENCOUNTER — Other Ambulatory Visit: Payer: Self-pay

## 2014-11-14 MED ORDER — NITROGLYCERIN 0.4 MG SL SUBL: 0.4000 mg | SUBLINGUAL_TABLET | SUBLINGUAL | Status: DC | PRN

## 2014-11-19 DIAGNOSIS — M456 Ankylosing spondylitis lumbar region: Secondary | ICD-10-CM | POA: Diagnosis not present

## 2014-11-19 DIAGNOSIS — M9908 Segmental and somatic dysfunction of rib cage: Secondary | ICD-10-CM | POA: Diagnosis not present

## 2014-11-19 DIAGNOSIS — M5134 Other intervertebral disc degeneration, thoracic region: Secondary | ICD-10-CM | POA: Diagnosis not present

## 2014-11-19 DIAGNOSIS — M9902 Segmental and somatic dysfunction of thoracic region: Secondary | ICD-10-CM | POA: Diagnosis not present

## 2014-11-25 DIAGNOSIS — S51852A Open bite of left forearm, initial encounter: Secondary | ICD-10-CM | POA: Diagnosis not present

## 2014-11-25 DIAGNOSIS — L03114 Cellulitis of left upper limb: Secondary | ICD-10-CM | POA: Diagnosis not present

## 2014-11-26 DIAGNOSIS — M5134 Other intervertebral disc degeneration, thoracic region: Secondary | ICD-10-CM | POA: Diagnosis not present

## 2014-11-26 DIAGNOSIS — M456 Ankylosing spondylitis lumbar region: Secondary | ICD-10-CM | POA: Diagnosis not present

## 2014-11-26 DIAGNOSIS — M9908 Segmental and somatic dysfunction of rib cage: Secondary | ICD-10-CM | POA: Diagnosis not present

## 2014-11-26 DIAGNOSIS — M9902 Segmental and somatic dysfunction of thoracic region: Secondary | ICD-10-CM | POA: Diagnosis not present

## 2014-11-28 DIAGNOSIS — I10 Essential (primary) hypertension: Secondary | ICD-10-CM | POA: Diagnosis not present

## 2014-11-28 DIAGNOSIS — I350 Nonrheumatic aortic (valve) stenosis: Secondary | ICD-10-CM | POA: Diagnosis not present

## 2014-11-28 DIAGNOSIS — E1165 Type 2 diabetes mellitus with hyperglycemia: Secondary | ICD-10-CM | POA: Diagnosis not present

## 2014-11-28 DIAGNOSIS — G4730 Sleep apnea, unspecified: Secondary | ICD-10-CM | POA: Diagnosis not present

## 2014-11-28 DIAGNOSIS — E785 Hyperlipidemia, unspecified: Secondary | ICD-10-CM | POA: Diagnosis not present

## 2014-11-28 DIAGNOSIS — G47419 Narcolepsy without cataplexy: Secondary | ICD-10-CM | POA: Diagnosis not present

## 2014-11-28 DIAGNOSIS — I2510 Atherosclerotic heart disease of native coronary artery without angina pectoris: Secondary | ICD-10-CM | POA: Diagnosis not present

## 2014-12-11 DIAGNOSIS — M5134 Other intervertebral disc degeneration, thoracic region: Secondary | ICD-10-CM | POA: Diagnosis not present

## 2014-12-11 DIAGNOSIS — M9902 Segmental and somatic dysfunction of thoracic region: Secondary | ICD-10-CM | POA: Diagnosis not present

## 2014-12-11 DIAGNOSIS — M456 Ankylosing spondylitis lumbar region: Secondary | ICD-10-CM | POA: Diagnosis not present

## 2014-12-11 DIAGNOSIS — M9908 Segmental and somatic dysfunction of rib cage: Secondary | ICD-10-CM | POA: Diagnosis not present

## 2014-12-25 DIAGNOSIS — M9908 Segmental and somatic dysfunction of rib cage: Secondary | ICD-10-CM | POA: Diagnosis not present

## 2014-12-25 DIAGNOSIS — M5134 Other intervertebral disc degeneration, thoracic region: Secondary | ICD-10-CM | POA: Diagnosis not present

## 2014-12-25 DIAGNOSIS — M9902 Segmental and somatic dysfunction of thoracic region: Secondary | ICD-10-CM | POA: Diagnosis not present

## 2014-12-25 DIAGNOSIS — M456 Ankylosing spondylitis lumbar region: Secondary | ICD-10-CM | POA: Diagnosis not present

## 2014-12-31 ENCOUNTER — Telehealth: Payer: Self-pay | Admitting: Internal Medicine

## 2014-12-31 NOTE — Telephone Encounter (Signed)
Pt last had refill on adderall 09/10/14 #90 1-3 tabs as directed daily.  Last OV 01/31/14 Pending 02/03/15 Please advise Dr. Annamaria Boots thanks

## 2015-01-01 MED ORDER — AMPHETAMINE-DEXTROAMPHETAMINE 10 MG PO TABS: 10.0000 mg | ORAL_TABLET | Freq: Every day | ORAL | Status: DC

## 2015-01-01 NOTE — Telephone Encounter (Signed)
rx printed, signed and placed up front.  lmtcb X1 to let pt know.

## 2015-01-01 NOTE — Telephone Encounter (Signed)
Pt returned call let him know that rx was ready for pick-up.Jordan [name]

## 2015-01-01 NOTE — Telephone Encounter (Signed)
Ok to refill 

## 2015-01-15 DIAGNOSIS — M9902 Segmental and somatic dysfunction of thoracic region: Secondary | ICD-10-CM | POA: Diagnosis not present

## 2015-01-15 DIAGNOSIS — M456 Ankylosing spondylitis lumbar region: Secondary | ICD-10-CM | POA: Diagnosis not present

## 2015-01-15 DIAGNOSIS — M9908 Segmental and somatic dysfunction of rib cage: Secondary | ICD-10-CM | POA: Diagnosis not present

## 2015-01-15 DIAGNOSIS — M5134 Other intervertebral disc degeneration, thoracic region: Secondary | ICD-10-CM | POA: Diagnosis not present

## 2015-02-03 ENCOUNTER — Encounter: Payer: Self-pay | Admitting: Internal Medicine

## 2015-02-03 ENCOUNTER — Ambulatory Visit (INDEPENDENT_AMBULATORY_CARE_PROVIDER_SITE_OTHER): Payer: BLUE CROSS/BLUE SHIELD | Admitting: Internal Medicine

## 2015-02-03 VITALS — BP 108/68 | HR 62 | Ht 67.0 in | Wt 245.0 lb

## 2015-02-03 DIAGNOSIS — G47419 Narcolepsy without cataplexy: Secondary | ICD-10-CM | POA: Diagnosis not present

## 2015-02-03 DIAGNOSIS — G4733 Obstructive sleep apnea (adult) (pediatric): Secondary | ICD-10-CM | POA: Diagnosis not present

## 2015-02-03 NOTE — Assessment & Plan Note (Signed)
He naps appropriately, uses his CPAP, and uses Adderall as needed.

## 2015-02-03 NOTE — Assessment & Plan Note (Signed)
He continues to need CPAP and describes good compliance and control. Pressure 9 is comfortable Plan- continue present settings.

## 2015-02-03 NOTE — Progress Notes (Signed)
12/23/11- 39 yoM former smoker followed for OSA, Narcolepsy w/o cataplexy, complicated by CAD, HBP.  VN Vet. LOV-12/17/10 Since last here he has used CPAP all night every night and would consider missing it. Pressure remains at 9/[facility]. He is comfortable with a nasal pillows mask. He feels that he sleeps well at night. Rarely needs a nap. Continues to use one10 mg adderall tablet each morning and occasionally one later in the day. Denies palpitation, chest pain or mood changes with this medication. We discussed controlled status, access and alternatives.  01/30/13- 20 yoM former smoker followed for OSA, Narcolepsy w/o cataplexy, complicated by CAD, HBP.  VN Vet. FOLLOWS OXB:DZHGD CPAP 9/ [facility] every night for 6-8 hours; pressure working well for patient; needs RX for Adderall He is clear that he is better off with CPAP. Using Adderall 10 mg from 1-3 daily, well tolerated with no concerns. Occasional 20 minute nap also helps.  01/31/14-  23 yoM former smoker followed for OSA and Narcolepsy w/o cataplexy, complicated by CAD, HBP.  VN Vet. FOLLOWS FOR:  Wearing CPAP 9/ [facility] 6 hours per night-no complaints with pressure Adderall  10 mg 1-3 daily Good compliance and control. Continues to need Adderall 10 mg, usually once but sometimes up to 3 daily. Daily exercise at "Y." Variable DOE on stairs but not at rest. Office Spirometry 01/31/14-  Within normal limits-FVC 3.09/77%, FEV1 2.45/80% , FEV1/FVC 0.79, FEF 25-75% 2.45/87%.   02/03/15- 75 yoM former smoker followed for OSA and Narcolepsy w/o cataplexy, complicated by CAD, HBP.  VN Vet. OSA; sleeps 6-8hrs nightly; feels rested when awakes'; no problems with CPAP CPAP 9/ [facility] remains very comfortable, used all night every night. Uses 1-2 Adderall 10 mg tabs/ day, especially for long drives. Very appropriate. Naps when needed. He is satisfied.   ROS-see HPI Constitutional:   No-   weight loss, night sweats, fevers, chills, fatigue, lassitude. HEENT:   No-   headaches, difficulty swallowing, tooth/dental problems, sore throat,       No-  sneezing, itching, ear ache, nasal congestion, post nasal drip,  CV:  No-   chest pain, orthopnea, PND, swelling in lower extremities, anasarca, dizziness, palpitations Resp: No-   shortness of breath with exertion or at rest.              No-   productive cough,  No non-productive cough,  No- coughing up of blood.              No-   change in color of mucus.  No- wheezing.   Skin: No-   rash or lesions. GI:  No-   heartburn, indigestion, abdominal pain, nausea, vomiting,  GU:  MS:  . Neuro-     nothing unusual Psych:  No- change in mood or affect. No depression or anxiety.  No memory loss.  OBJ- Physical Exam General- Alert, Oriented, Affect-appropriate, Distress- none acute.+ overweight Skin- rash-none, lesions- none, excoriation- none Lymphadenopathy- none Head- atraumatic            Eyes- Gross vision intact, PERRLA, conjunctivae and secretions clear            Ears- Hearing, canals-normal            Nose- Clear, no-Septal dev, mucus, polyps, erosion, perforation             Throat- Mallampati III , mucosa clear , drainage- none, tonsils- atrophic Neck- flexible , trachea midline, no stridor , thyroid nl, carotid no bruit Chest - symmetrical  excursion , unlabored           Heart/CV- RRR , no murmur , no gallop  , no rub, nl s1 s2                           - JVD- none , edema- none, stasis changes- none, varices- none           Lung- clear to P&A, wheeze- none, cough- none , dullness-none, rub- none           Chest wall-  Abd- Br/ Gen/ Rectal- Not done, not indicated Extrem- cyanosis- none, clubbing, none, atrophy- none, strength- nl Neuro- grossly intact to observation

## 2015-02-03 NOTE — Patient Instructions (Signed)
We can continue CPAP 9/ Janina Mayo to call for Adderall as needed

## 2015-02-13 ENCOUNTER — Other Ambulatory Visit: Payer: Self-pay | Admitting: Interventional Cardiology

## 2015-02-19 ENCOUNTER — Telehealth: Payer: Self-pay | Admitting: Internal Medicine

## 2015-02-19 MED ORDER — AMPHETAMINE-DEXTROAMPHETAMINE 10 MG PO TABS: 10.0000 mg | ORAL_TABLET | Freq: Every day | ORAL | Status: DC

## 2015-02-19 NOTE — Telephone Encounter (Signed)
Ok to refill Adderall 

## 2015-02-19 NOTE — Telephone Encounter (Signed)
Rx printed and left at CY's station for signature.  Placed at front desk for pick up.

## 2015-02-19 NOTE — Telephone Encounter (Signed)
Patient needs refill of Adderall.  Last OV: 02/03/15; next OV: 02/03/16.  Last refilled: 01/01/15  Current Outpatient Prescriptions on File Prior to Visit  Medication Sig Dispense Refill  . amphetamine-dextroamphetamine (ADDERALL) 10 MG tablet Take 1-3 tablets (10-30 mg total) by mouth daily. As directed 90 tablet 0  . aspirin EC 81 MG tablet Take 81 mg by mouth daily.    [name] atorvastatin (LIPITOR) 40 MG tablet Take 20 mg by mouth daily before breakfast.     . glucosamine-chondroitin 500-400 MG tablet Take 2 tablets by mouth daily.    [name] lisinopril (PRINIVIL,ZESTRIL) 40 MG tablet TAKE 1 TABLET BY MOUTH EVERY DAY BEFORE BREAKFAST 90 tablet 0  . metFORMIN (GLUCOPHAGE) 500 MG tablet Take 1 tablet by mouth daily before breakfast.     . Multiple Vitamin (MULTIVITAMIN) tablet Take 1 tablet by mouth daily.    . nitroGLYCERIN (NITROSTAT) 0.4 MG SL tablet Place 1 tablet (0.4 mg total) under the tongue every 5 (five) minutes as needed for chest pain. 25 tablet 3  . Omega-3 Fatty Acids (FISH OIL) 1000 MG CAPS Take 6 capsules by mouth daily.      No current facility-administered medications on file prior to visit.   No Known Allergies

## 2015-02-26 DIAGNOSIS — M9902 Segmental and somatic dysfunction of thoracic region: Secondary | ICD-10-CM | POA: Diagnosis not present

## 2015-02-26 DIAGNOSIS — M5032 Other cervical disc degeneration, mid-cervical region: Secondary | ICD-10-CM | POA: Diagnosis not present

## 2015-02-26 DIAGNOSIS — M62838 Other muscle spasm: Secondary | ICD-10-CM | POA: Diagnosis not present

## 2015-02-26 DIAGNOSIS — M9901 Segmental and somatic dysfunction of cervical region: Secondary | ICD-10-CM | POA: Diagnosis not present

## 2015-03-06 DIAGNOSIS — M9902 Segmental and somatic dysfunction of thoracic region: Secondary | ICD-10-CM | POA: Diagnosis not present

## 2015-03-06 DIAGNOSIS — M62838 Other muscle spasm: Secondary | ICD-10-CM | POA: Diagnosis not present

## 2015-03-06 DIAGNOSIS — M9901 Segmental and somatic dysfunction of cervical region: Secondary | ICD-10-CM | POA: Diagnosis not present

## 2015-03-06 DIAGNOSIS — M5032 Other cervical disc degeneration, mid-cervical region: Secondary | ICD-10-CM | POA: Diagnosis not present

## 2015-03-07 DIAGNOSIS — M9901 Segmental and somatic dysfunction of cervical region: Secondary | ICD-10-CM | POA: Diagnosis not present

## 2015-03-07 DIAGNOSIS — M9902 Segmental and somatic dysfunction of thoracic region: Secondary | ICD-10-CM | POA: Diagnosis not present

## 2015-03-07 DIAGNOSIS — M62838 Other muscle spasm: Secondary | ICD-10-CM | POA: Diagnosis not present

## 2015-03-07 DIAGNOSIS — M5032 Other cervical disc degeneration, mid-cervical region: Secondary | ICD-10-CM | POA: Diagnosis not present

## 2015-03-11 DIAGNOSIS — N400 Enlarged prostate without lower urinary tract symptoms: Secondary | ICD-10-CM | POA: Diagnosis not present

## 2015-03-28 ENCOUNTER — Telehealth: Payer: Self-pay | Admitting: Internal Medicine

## 2015-03-28 MED ORDER — AMPHETAMINE-DEXTROAMPHETAMINE 10 MG PO TABS: 10.0000 mg | ORAL_TABLET | Freq: Every day | ORAL | Status: DC

## 2015-03-28 NOTE — Telephone Encounter (Signed)
Pt informed rx being refilled.  Nothing further needed at this time.

## 2015-03-28 NOTE — Telephone Encounter (Signed)
Ok to refill 

## 2015-03-28 NOTE — Telephone Encounter (Signed)
Last OV 02/03/15 Pending OV 02/06/16 Last refill 02/19/15  CY - please advise on refill. Thanks.

## 2015-04-17 DIAGNOSIS — M62838 Other muscle spasm: Secondary | ICD-10-CM | POA: Diagnosis not present

## 2015-04-17 DIAGNOSIS — M9902 Segmental and somatic dysfunction of thoracic region: Secondary | ICD-10-CM | POA: Diagnosis not present

## 2015-04-17 DIAGNOSIS — M5032 Other cervical disc degeneration, mid-cervical region: Secondary | ICD-10-CM | POA: Diagnosis not present

## 2015-04-17 DIAGNOSIS — M9901 Segmental and somatic dysfunction of cervical region: Secondary | ICD-10-CM | POA: Diagnosis not present

## 2015-05-09 DIAGNOSIS — M9901 Segmental and somatic dysfunction of cervical region: Secondary | ICD-10-CM | POA: Diagnosis not present

## 2015-05-09 DIAGNOSIS — M9902 Segmental and somatic dysfunction of thoracic region: Secondary | ICD-10-CM | POA: Diagnosis not present

## 2015-05-09 DIAGNOSIS — M62838 Other muscle spasm: Secondary | ICD-10-CM | POA: Diagnosis not present

## 2015-05-09 DIAGNOSIS — M5032 Other cervical disc degeneration, mid-cervical region: Secondary | ICD-10-CM | POA: Diagnosis not present

## 2015-05-10 ENCOUNTER — Other Ambulatory Visit: Payer: Self-pay | Admitting: Interventional Cardiology

## 2015-05-28 DIAGNOSIS — M9901 Segmental and somatic dysfunction of cervical region: Secondary | ICD-10-CM | POA: Diagnosis not present

## 2015-05-28 DIAGNOSIS — M9902 Segmental and somatic dysfunction of thoracic region: Secondary | ICD-10-CM | POA: Diagnosis not present

## 2015-05-28 DIAGNOSIS — M62838 Other muscle spasm: Secondary | ICD-10-CM | POA: Diagnosis not present

## 2015-05-28 DIAGNOSIS — M5032 Other cervical disc degeneration, mid-cervical region: Secondary | ICD-10-CM | POA: Diagnosis not present

## 2015-05-29 DIAGNOSIS — E1165 Type 2 diabetes mellitus with hyperglycemia: Secondary | ICD-10-CM | POA: Diagnosis not present

## 2015-05-29 DIAGNOSIS — I10 Essential (primary) hypertension: Secondary | ICD-10-CM | POA: Diagnosis not present

## 2015-05-29 DIAGNOSIS — E785 Hyperlipidemia, unspecified: Secondary | ICD-10-CM | POA: Diagnosis not present

## 2015-05-29 DIAGNOSIS — Z125 Encounter for screening for malignant neoplasm of prostate: Secondary | ICD-10-CM | POA: Diagnosis not present

## 2015-05-29 DIAGNOSIS — B351 Tinea unguium: Secondary | ICD-10-CM | POA: Diagnosis not present

## 2015-06-05 DIAGNOSIS — M9902 Segmental and somatic dysfunction of thoracic region: Secondary | ICD-10-CM | POA: Diagnosis not present

## 2015-06-05 DIAGNOSIS — M546 Pain in thoracic spine: Secondary | ICD-10-CM | POA: Diagnosis not present

## 2015-06-05 DIAGNOSIS — M5134 Other intervertebral disc degeneration, thoracic region: Secondary | ICD-10-CM | POA: Diagnosis not present

## 2015-06-17 DIAGNOSIS — M5134 Other intervertebral disc degeneration, thoracic region: Secondary | ICD-10-CM | POA: Diagnosis not present

## 2015-06-17 DIAGNOSIS — M546 Pain in thoracic spine: Secondary | ICD-10-CM | POA: Diagnosis not present

## 2015-06-17 DIAGNOSIS — M9902 Segmental and somatic dysfunction of thoracic region: Secondary | ICD-10-CM | POA: Diagnosis not present

## 2015-06-18 DIAGNOSIS — M546 Pain in thoracic spine: Secondary | ICD-10-CM | POA: Diagnosis not present

## 2015-06-18 DIAGNOSIS — M5134 Other intervertebral disc degeneration, thoracic region: Secondary | ICD-10-CM | POA: Diagnosis not present

## 2015-06-18 DIAGNOSIS — M9902 Segmental and somatic dysfunction of thoracic region: Secondary | ICD-10-CM | POA: Diagnosis not present

## 2015-06-19 DIAGNOSIS — M9902 Segmental and somatic dysfunction of thoracic region: Secondary | ICD-10-CM | POA: Diagnosis not present

## 2015-06-19 DIAGNOSIS — M5134 Other intervertebral disc degeneration, thoracic region: Secondary | ICD-10-CM | POA: Diagnosis not present

## 2015-06-19 DIAGNOSIS — M546 Pain in thoracic spine: Secondary | ICD-10-CM | POA: Diagnosis not present

## 2015-06-20 DIAGNOSIS — M9902 Segmental and somatic dysfunction of thoracic region: Secondary | ICD-10-CM | POA: Diagnosis not present

## 2015-06-20 DIAGNOSIS — M5134 Other intervertebral disc degeneration, thoracic region: Secondary | ICD-10-CM | POA: Diagnosis not present

## 2015-06-20 DIAGNOSIS — M546 Pain in thoracic spine: Secondary | ICD-10-CM | POA: Diagnosis not present

## 2015-06-23 DIAGNOSIS — M5134 Other intervertebral disc degeneration, thoracic region: Secondary | ICD-10-CM | POA: Diagnosis not present

## 2015-06-23 DIAGNOSIS — M9902 Segmental and somatic dysfunction of thoracic region: Secondary | ICD-10-CM | POA: Diagnosis not present

## 2015-06-23 DIAGNOSIS — M546 Pain in thoracic spine: Secondary | ICD-10-CM | POA: Diagnosis not present

## 2015-07-10 DIAGNOSIS — M9902 Segmental and somatic dysfunction of thoracic region: Secondary | ICD-10-CM | POA: Diagnosis not present

## 2015-07-10 DIAGNOSIS — M546 Pain in thoracic spine: Secondary | ICD-10-CM | POA: Diagnosis not present

## 2015-07-10 DIAGNOSIS — M5134 Other intervertebral disc degeneration, thoracic region: Secondary | ICD-10-CM | POA: Diagnosis not present

## 2015-07-23 DIAGNOSIS — M9905 Segmental and somatic dysfunction of pelvic region: Secondary | ICD-10-CM | POA: Diagnosis not present

## 2015-07-23 DIAGNOSIS — M9904 Segmental and somatic dysfunction of sacral region: Secondary | ICD-10-CM | POA: Diagnosis not present

## 2015-07-23 DIAGNOSIS — M9903 Segmental and somatic dysfunction of lumbar region: Secondary | ICD-10-CM | POA: Diagnosis not present

## 2015-07-23 DIAGNOSIS — M5136 Other intervertebral disc degeneration, lumbar region: Secondary | ICD-10-CM | POA: Diagnosis not present

## 2015-07-31 DIAGNOSIS — M9903 Segmental and somatic dysfunction of lumbar region: Secondary | ICD-10-CM | POA: Diagnosis not present

## 2015-07-31 DIAGNOSIS — M5136 Other intervertebral disc degeneration, lumbar region: Secondary | ICD-10-CM | POA: Diagnosis not present

## 2015-07-31 DIAGNOSIS — M9904 Segmental and somatic dysfunction of sacral region: Secondary | ICD-10-CM | POA: Diagnosis not present

## 2015-07-31 DIAGNOSIS — M9905 Segmental and somatic dysfunction of pelvic region: Secondary | ICD-10-CM | POA: Diagnosis not present

## 2015-08-12 DIAGNOSIS — M5136 Other intervertebral disc degeneration, lumbar region: Secondary | ICD-10-CM | POA: Diagnosis not present

## 2015-08-12 DIAGNOSIS — M9903 Segmental and somatic dysfunction of lumbar region: Secondary | ICD-10-CM | POA: Diagnosis not present

## 2015-08-12 DIAGNOSIS — M9904 Segmental and somatic dysfunction of sacral region: Secondary | ICD-10-CM | POA: Diagnosis not present

## 2015-08-12 DIAGNOSIS — M9905 Segmental and somatic dysfunction of pelvic region: Secondary | ICD-10-CM | POA: Diagnosis not present

## 2015-08-13 DIAGNOSIS — M9903 Segmental and somatic dysfunction of lumbar region: Secondary | ICD-10-CM | POA: Diagnosis not present

## 2015-08-13 DIAGNOSIS — M9905 Segmental and somatic dysfunction of pelvic region: Secondary | ICD-10-CM | POA: Diagnosis not present

## 2015-08-13 DIAGNOSIS — M9904 Segmental and somatic dysfunction of sacral region: Secondary | ICD-10-CM | POA: Diagnosis not present

## 2015-08-13 DIAGNOSIS — M5136 Other intervertebral disc degeneration, lumbar region: Secondary | ICD-10-CM | POA: Diagnosis not present

## 2015-08-14 ENCOUNTER — Other Ambulatory Visit: Payer: Self-pay | Admitting: Interventional Cardiology

## 2015-08-14 DIAGNOSIS — M9904 Segmental and somatic dysfunction of sacral region: Secondary | ICD-10-CM | POA: Diagnosis not present

## 2015-08-14 DIAGNOSIS — M5136 Other intervertebral disc degeneration, lumbar region: Secondary | ICD-10-CM | POA: Diagnosis not present

## 2015-08-14 DIAGNOSIS — M9905 Segmental and somatic dysfunction of pelvic region: Secondary | ICD-10-CM | POA: Diagnosis not present

## 2015-08-14 DIAGNOSIS — M9903 Segmental and somatic dysfunction of lumbar region: Secondary | ICD-10-CM | POA: Diagnosis not present

## 2015-08-25 DIAGNOSIS — M9903 Segmental and somatic dysfunction of lumbar region: Secondary | ICD-10-CM | POA: Diagnosis not present

## 2015-08-25 DIAGNOSIS — M5136 Other intervertebral disc degeneration, lumbar region: Secondary | ICD-10-CM | POA: Diagnosis not present

## 2015-08-25 DIAGNOSIS — M9904 Segmental and somatic dysfunction of sacral region: Secondary | ICD-10-CM | POA: Diagnosis not present

## 2015-08-25 DIAGNOSIS — M9905 Segmental and somatic dysfunction of pelvic region: Secondary | ICD-10-CM | POA: Diagnosis not present

## 2015-09-04 ENCOUNTER — Encounter: Payer: Self-pay | Admitting: Cardiology

## 2015-09-04 ENCOUNTER — Ambulatory Visit (INDEPENDENT_AMBULATORY_CARE_PROVIDER_SITE_OTHER): Payer: 59 | Admitting: Cardiology

## 2015-09-04 ENCOUNTER — Ambulatory Visit: Payer: Medicare Other | Admitting: Cardiology

## 2015-09-04 VITALS — BP 114/68 | HR 55 | Ht 67.0 in | Wt 242.0 lb

## 2015-09-04 DIAGNOSIS — I10 Essential (primary) hypertension: Secondary | ICD-10-CM

## 2015-09-04 DIAGNOSIS — I4589 Other specified conduction disorders: Secondary | ICD-10-CM

## 2015-09-04 DIAGNOSIS — I2510 Atherosclerotic heart disease of native coronary artery without angina pectoris: Secondary | ICD-10-CM

## 2015-09-04 NOTE — Patient Instructions (Signed)
Cecilie Kicks, NP, recommends that you schedule a follow-up appointment in 1 year with Dr Tamala Julian. You will receive a reminder letter in the mail two months in advance. If you don't receive a letter, please call our office to schedule the follow-up appointment.  If you need a refill on your cardiac medications before your next appointment, please call your pharmacy.

## 2015-09-04 NOTE — Progress Notes (Signed)
Cardiology Office Note   Date:  09/04/2015   ID:  Jordan [name], DOB 19-Aug-1941, MRN [id number]  PCP:  [name], MD  Cardiologist:  Dr. Tamala Julian  EP Dr. Lovena Le    Chief Complaint  Patient presents with  . Coronary Artery Disease    no compalints      History of Present Illness: Jordan [name] is a 74 y.o. 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  He was evaluated by Dr. Lovena Le and watchful waiting was plan, if unstable then PPM.  Has been doing well - still exercising on bike most days. No syncope, only time he feels different if walking up stairs and up hill on way to [facility].  He stops for seconds and can continue.  He has no chest pain with this or SOB.  He has been eating healthy.     Past Medical History  Diagnosis Date  . Coronary heart disease     Dr Pernell Dupre  . Hyperlipidemia   . OSA (obstructive sleep apnea)     NPSG 12-13-98 AHI 22.7  . Narcolepsy     [name] 04-11-97; Mean Latency 1.12min, SOREM 2  . Complication of anesthesia   . PONV (postoperative nausea and vomiting)   . Shortness of breath     with exertion   . Diabetes mellitus without complication ([place])   . Arthritis     R shoulder, bone spur  . H/O cardiovascular stress test     perhaps last one was 2009  . Hypertension     saw Dr. Linard Millers last earl;y- 2014, cardiac cath. last done ?2009, blocks seen didn't require any intervention at that point.  [name] Heart murmur     12/29/11 echo: mild MR, no AS, trivial TR    Past Surgical History  Procedure Laterality Date  . Tonsillectomy    . Nasal fracture surgery    . Cardiac catheterization  2009?  [name] Shoulder arthroscopy with rotator cuff repair and subacromial decompression Right 08/16/2013    Procedure: SHOULDER ARTHROSCOPY WITH ROTATOR CUFF REPAIR AND SUBACROMIAL DECOMPRESSION;  Surgeon: [name], MD;  Location: [facility];  Service: Orthopedics;  Laterality: Right;  Right shoulder  arthroscopy rotator cuff repair, subacromial decompression.  . Left and right heart catheterization with coronary angiogram N/A 06/05/2014    Procedure: LEFT AND RIGHT HEART CATHETERIZATION WITH CORONARY ANGIOGRAM;  Surgeon: Sinclair Grooms, MD;  Location: [facility] CATH LAB;  Service: Cardiovascular;  Laterality: N/A;     Current Outpatient Prescriptions  Medication Sig Dispense Refill  . amphetamine-dextroamphetamine (ADDERALL) 10 MG tablet Take 1-3 tablets (10-30 mg total) by mouth daily. As directed 90 tablet 0  . aspirin EC 81 MG tablet Take 81 mg by mouth daily.    [name] atorvastatin (LIPITOR) 40 MG tablet Take 20 mg by mouth daily before breakfast.     . glucosamine-chondroitin 500-400 MG tablet Take 2 tablets by mouth daily.    [name] lisinopril (PRINIVIL,ZESTRIL) 40 MG tablet TAKE 1 TABLET BY MOUTH EVERY DAY BEFORE BREAKFAST 30 tablet 0  . metFORMIN (GLUCOPHAGE) 500 MG tablet Take 1,000 mg by mouth daily before breakfast.     . Multiple Vitamin (MULTIVITAMIN) tablet Take 1 tablet by mouth daily.    . nitroGLYCERIN (NITROSTAT) 0.4 MG SL tablet Place 1 tablet (0.4 mg total) under the tongue every 5 (five) minutes as needed for chest pain. 25 tablet 3  . Omega-3 Fatty  Acids (FISH OIL) 1000 MG CAPS Take 6 capsules by mouth daily.     [name] terbinafine (LAMISIL) 250 MG tablet TAKE 1 TABLET BY MOUTH EVERY DAY FOR NAIL FUNGAL INFECTION  0   No current facility-administered medications for this visit.    Allergies:   Review of patient's allergies indicates no known allergies.    Social History:  The patient  reports that he quit smoking about 48 years ago. His smoking use included Cigarettes. He smoked 2.50 packs per day. He does not have any smokeless tobacco history on file. He reports that he drinks alcohol. He reports that he does not use illicit drugs.   Family History:  The patient's family history includes Colon cancer in his sister; Sleep apnea in his sister.    ROS:  General:no colds or fevers,  no weight changes Skin:no rashes or ulcers HEENT:no blurred vision, no congestion CV:see HPI PUL:see HPI GI:no diarrhea constipation or melena, no indigestion GU:no hematuria, no dysuria MS:no joint pain, no claudication Neuro:no syncope, no lightheadedness Endo:+ diabetes controlled, no thyroid disease  Wt Readings from Last 3 Encounters:  09/04/15 242 lb (109.77 kg)  02/03/15 245 lb (111.131 kg)  07/09/14 246 lb 6.4 oz (111.766 kg)     PHYSICAL EXAM: VS:  BP 114/68 mmHg  Pulse 55  Ht 5\' 7"  (1.702 m)  Wt 242 lb (109.77 kg)  BMI 37.89 kg/m2 , BMI Body mass index is 37.89 kg/(m^2). General:Pleasant affect, NAD Skin:Warm and dry, brisk capillary refill HEENT:normocephalic, sclera clear, mucus membranes moist Neck:supple, no JVD, no bruits  Heart:S1S2 RRR with soft 1/6 systolic murmur, no gallup, rub or click Lungs:clear without rales, rhonchi, or wheezes GEX:BMWU, non tender, + BS, do not palpate liver spleen or masses Ext:no lower ext edema, 2+ pedal pulses, 2+ radial pulses Neuro:alert and oriented X 3, MAE, follows commands, + facial symmetry    EKG:  EKG is ordered today. The ekg ordered today demonstrates Sinus [place] at 12, RBBB and LAFB, LVH no acute changes from previous.    Recent Labs: No results found for requested labs within last 365 days.    Lipid Panel No results found for: CHOL, TRIG, HDL, CHOLHDL, VLDL, LDLCALC, LDLDIRECT     Other studies Reviewed: Additional studies/ records that were reviewed today include: previous office notes, have called Dr. Kenton Kingfisher for recent labs.   Cardiac cath: 1. Three-vessel coronary calcification but with no evidence of significant obstructive disease. In fact when compared to prior angiogram data, obstructive disease has regressed. 2. Normal systolic function without evidence of diastolic dysfunction based upon end-diastolic pressures. 3. Failed attempt to perform right heart catheterization from the right antecubital  vein do to possible occlusion of the right subclavian vein/innominate   ECHO: ------------------------------------------------------------ Study Conclusions  - Left ventricle: The cavity size was normal. Wall thickness was increased in a pattern of mild LVH. Systolic function was normal. The estimated ejection fraction was in the range of 55% to 60%. Wall motion was normal; there were no regional wall motion abnormalities. Doppler parameters are consistent with abnormal left ventricular relaxation (grade 1 diastolic dysfunction). - Mitral valve: Calcified annulus. - Left atrium: The atrium was mildly dilated.  ASSESSMENT AND PLAN:  1.  Chronotropic incompetence- asymptomatic currently- no further episodes of syncope follow up with Dr. Tamala Julian in 1 year unless symptoms prior to that time.  2. CAD in native vessel- no angina-last cath 2015.  3. Chronic diastolic HF with LVH asymptomatic  4. bifascicular block. Stable.  5. HTN controlled.  6. 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     Current medicines are reviewed with the patient today.  The patient Has no concerns regarding medicines.  The following changes have been made:  See above Labs/ tests ordered today include:see above  Disposition:   FU:  see above  Lennie Muckle, NP  09/04/2015 12:11 PM    [facility] [address] [place], [place] Phone: [phone]; Fax: [phone]

## 2015-09-10 ENCOUNTER — Telehealth: Payer: Self-pay | Admitting: Interventional Cardiology

## 2015-09-10 NOTE — Telephone Encounter (Signed)
New  Message ° °Pt returned call  °

## 2015-09-11 ENCOUNTER — Telehealth: Payer: Self-pay | Admitting: Cardiology

## 2015-09-11 NOTE — Telephone Encounter (Signed)
New message ° ° ° ° °Returning a call to the nurse °

## 2015-09-11 NOTE — Telephone Encounter (Signed)
Returned pt call .lmom. I did NOT call pt originally, and do not see where any one from our office has been trying to reach him. Pt should call back if assistance is needed

## 2015-09-11 NOTE — Telephone Encounter (Signed)
Left message to call back.   Notes Recorded by Isaiah Serge, NP on 09/05/2015 at 5:33 PM Lipids were good.

## 2015-09-12 DIAGNOSIS — R404 Transient alteration of awareness: Secondary | ICD-10-CM | POA: Diagnosis not present

## 2015-09-12 DIAGNOSIS — R42 Dizziness and giddiness: Secondary | ICD-10-CM | POA: Diagnosis not present

## 2015-09-12 NOTE — Telephone Encounter (Signed)
Follow up  ° ° °Patient returning call back to nurse  °

## 2015-09-12 NOTE — Telephone Encounter (Signed)
Follow up     Patient calling back to speak with nurse from yesterday.

## 2015-09-15 ENCOUNTER — Other Ambulatory Visit: Payer: Self-pay | Admitting: Interventional Cardiology

## 2015-09-16 ENCOUNTER — Telehealth: Payer: Self-pay | Admitting: Interventional Cardiology

## 2015-09-16 NOTE — Telephone Encounter (Signed)
New Message (General)    Pt called states that he had an episode at the [facility] over the weekend. He worked out on the bike for 30 minutes and became fairly tired. Pt states that he left the fitness area and took a seat. A employee at the [facility] was asked for a bed for him to lye down. Pt states that at that time his palms and soles of his hands and feet became very itchy with the inability to ellivate the itchy feeling. Itching sensation was persitant for about 20 minutes and after sitting up his vision became blurry. Pt reports that after 5 minutes the vision cleared. [employer] suggested the EMT for further assistance. They came took an EKG.. Checked oxygen and BP. Per pt everything that was checked was low to moderate( No readings). Per pt EMT said that they couldnt find anything. Pt simply request a call back to discuss this.   Pt has called previous times returning a call. Per notes there may have been a lab from 09/05/2015. Please call back to discuss

## 2015-09-16 NOTE — Telephone Encounter (Signed)
Left message for patient to call back  

## 2015-09-16 NOTE — Telephone Encounter (Signed)
F/u  Pt returning RN phone call 

## 2015-09-16 NOTE — Telephone Encounter (Signed)
2nd attempt to reach pt. Lmom. ptcb Pam, RN has been trying to reach pt. I was calling to discuss the EKG pt dropped off to our office.

## 2015-09-16 NOTE — Telephone Encounter (Signed)
Returned pt call.lmtcb Pt EMT EKG has been reviews by Dr.Smith. No acute changes, compared to pt prior EKG

## 2015-09-17 NOTE — Telephone Encounter (Signed)
Returned pt call. Pt aware of lab results.  Pt adv  Pt EMT EKG has been reviews by Dr.Smith. No acute changes, compared to pt prior EKG Pt sts that he has not had any reoccurrences [facility]. Pt voiced appreciation and verbalized understanding.

## 2015-09-22 ENCOUNTER — Other Ambulatory Visit: Payer: Self-pay | Admitting: *Deleted

## 2015-09-22 MED ORDER — LISINOPRIL 40 MG PO TABS: ORAL_TABLET | ORAL | Status: DC

## 2015-09-22 NOTE — Telephone Encounter (Signed)
Patient aware of lab results. See phone note 09/16/15.

## 2015-10-01 DIAGNOSIS — M9904 Segmental and somatic dysfunction of sacral region: Secondary | ICD-10-CM | POA: Diagnosis not present

## 2015-10-01 DIAGNOSIS — M5136 Other intervertebral disc degeneration, lumbar region: Secondary | ICD-10-CM | POA: Diagnosis not present

## 2015-10-01 DIAGNOSIS — M9903 Segmental and somatic dysfunction of lumbar region: Secondary | ICD-10-CM | POA: Diagnosis not present

## 2015-10-01 DIAGNOSIS — M9905 Segmental and somatic dysfunction of pelvic region: Secondary | ICD-10-CM | POA: Diagnosis not present

## 2015-10-09 ENCOUNTER — Encounter: Payer: Self-pay | Admitting: Cardiology

## 2015-12-02 DIAGNOSIS — G47419 Narcolepsy without cataplexy: Secondary | ICD-10-CM | POA: Diagnosis not present

## 2015-12-02 DIAGNOSIS — I2510 Atherosclerotic heart disease of native coronary artery without angina pectoris: Secondary | ICD-10-CM | POA: Diagnosis not present

## 2015-12-02 DIAGNOSIS — G4730 Sleep apnea, unspecified: Secondary | ICD-10-CM | POA: Diagnosis not present

## 2015-12-02 DIAGNOSIS — I10 Essential (primary) hypertension: Secondary | ICD-10-CM | POA: Diagnosis not present

## 2015-12-02 DIAGNOSIS — E1165 Type 2 diabetes mellitus with hyperglycemia: Secondary | ICD-10-CM | POA: Diagnosis not present

## 2015-12-02 DIAGNOSIS — Z7984 Long term (current) use of oral hypoglycemic drugs: Secondary | ICD-10-CM | POA: Diagnosis not present

## 2015-12-02 DIAGNOSIS — E785 Hyperlipidemia, unspecified: Secondary | ICD-10-CM | POA: Diagnosis not present

## 2016-01-01 ENCOUNTER — Telehealth: Payer: Self-pay | Admitting: Internal Medicine

## 2016-01-01 NOTE — Telephone Encounter (Signed)
atc pt, no answer, vm full.  Wcb.  

## 2016-01-05 MED ORDER — AMPHETAMINE-DEXTROAMPHETAMINE 10 MG PO TABS: 10.0000 mg | ORAL_TABLET | Freq: Every day | ORAL | Status: DC

## 2016-01-05 NOTE — Telephone Encounter (Signed)
Ok to refill 

## 2016-01-05 NOTE — Telephone Encounter (Signed)
Rx printed and left at front for patient to pick up. Patient aware. Nothing further needed.

## 2016-01-05 NOTE — Telephone Encounter (Signed)
Spoke with pt, requesting refill on Adderall.   Last rx Adderall 10mg  #90 1-3 tabs qd as directed with 0 refills on 03/28/15.  Pt wishes to pick up rx from office.  CY please advise if you're ok with this refill.  Thanks!

## 2016-02-03 ENCOUNTER — Ambulatory Visit: Payer: Medicare Other | Admitting: Internal Medicine

## 2016-02-05 ENCOUNTER — Encounter: Payer: Self-pay | Admitting: Internal Medicine

## 2016-02-05 ENCOUNTER — Encounter (INDEPENDENT_AMBULATORY_CARE_PROVIDER_SITE_OTHER): Payer: Self-pay

## 2016-02-05 ENCOUNTER — Ambulatory Visit (INDEPENDENT_AMBULATORY_CARE_PROVIDER_SITE_OTHER): Payer: 59 | Admitting: Internal Medicine

## 2016-02-05 VITALS — BP 122/78 | HR 62 | Ht 67.0 in | Wt 243.0 lb

## 2016-02-05 DIAGNOSIS — E669 Obesity, unspecified: Secondary | ICD-10-CM

## 2016-02-05 DIAGNOSIS — G4733 Obstructive sleep apnea (adult) (pediatric): Secondary | ICD-10-CM | POA: Diagnosis not present

## 2016-02-05 DIAGNOSIS — G47419 Narcolepsy without cataplexy: Secondary | ICD-10-CM

## 2016-02-05 MED ORDER — AMPHETAMINE-DEXTROAMPHETAMINE 10 MG PO TABS: 10.0000 mg | ORAL_TABLET | Freq: Every day | ORAL | Status: DC

## 2016-02-05 NOTE — Patient Instructions (Addendum)
Order- DME [facility]- please download CPAP for pressure compliance. Add AirView if available     Dx OSA  Refill script for Adderall printed  Speak with Dr Kenton Kingfisher about your shortness of breath with exertion. He might want to do a stress test.   We can continue CPAP 9/ [facility]

## 2016-02-05 NOTE — Progress Notes (Signed)
12/23/11- 54 yoM former smoker followed for OSA, Narcolepsy w/o cataplexy, complicated by CAD, HBP.  VN Vet. LOV-12/17/10 Since last here he has used CPAP all night every night and would consider missing it. Pressure remains at 9/[facility]. He is comfortable with a nasal pillows mask. He feels that he sleeps well at night. Rarely needs a nap. Continues to use one10 mg adderall tablet each morning and occasionally one later in the day. Denies palpitation, chest pain or mood changes with this medication. We discussed controlled status, access and alternatives.  01/30/13- 84 yoM former smoker followed for OSA, Narcolepsy w/o cataplexy, complicated by CAD, HBP.  VN Vet. FOLLOWS [id number] CPAP 9/ [facility] every night for 6-8 hours; pressure working well for patient; needs RX for Adderall He is clear that he is better off with CPAP. Using Adderall 10 mg from 1-3 daily, well tolerated with no concerns. Occasional 20 minute nap also helps.  01/31/14-  48 yoM former smoker followed for OSA and Narcolepsy w/o cataplexy, complicated by CAD, HBP.  VN Vet. FOLLOWS FOR:  Wearing CPAP 9/ [employer] 6 hours per night-no complaints with pressure Adderall  10 mg 1-3 daily Good compliance and control. Continues to need Adderall 10 mg, usually once but sometimes up to 3 daily. [facility] at [facility] Variable DOE on stairs but not at rest. Office Spirometry 01/31/14-  Within normal limits-FVC 3.09/77%, FEV1 2.45/80% , FEV1/FVC 0.79, FEF 25-75% 2.45/87%.   02/03/15- 59 yoM former smoker followed for OSA and Narcolepsy w/o cataplexy, complicated by CAD, HBP.  VN Vet. OSA; sleeps 6-8hrs nightly; feels rested when awakes'; no problems with CPAP CPAP 9/ [facility] remains very comfortable, used all night every night. Uses 1-2 Adderall 10 mg tabs/ day, especially for long drives. Very appropriate. Naps when needed. He is satisfied.   02/05/2016-74 year old male former smoker followed for OSA/Narcolepsy without cataplexy, complicated by CAD, HBP.  VN Vet CPAP 9/ [facility] FOLLOWS FOR: DME: [facility]. Pt states he wears CPAP every night; No DL or in [place]. Will need to order DL  He had lost an Adderall prescription before filling it and went month without it, confirming for him that it is important to his quality of life. He thought sleepiness and lack of focus despite frequent naps. Otherwise stable-aware of some dyspnea on stairs or treadmill but rides exercise bike school at gym routinely without change. Denies chest pain, palpitation cough, wheeze.  ROS-see HPI Constitutional:   No-   weight loss, night sweats, fevers, chills, fatigue, lassitude. HEENT:   No-  headaches, difficulty swallowing, tooth/dental problems, sore throat,       No-  sneezing, itching, ear ache, nasal congestion, post nasal drip,  CV:  No-   chest pain, orthopnea, PND, swelling in lower extremities, anasarca, dizziness, palpitations Resp: + shortness of breath with exertion or at rest.              No-   productive cough,  No non-productive cough,  No- coughing up of blood.              No-   change in color of mucus.  No- wheezing.   Skin: No-   rash or lesions. GI:  No-   heartburn, indigestion, abdominal pain, nausea, vomiting,  GU:  MS:  . Neuro-     nothing unusual Psych:  No- change in mood or affect. No depression or anxiety.  No memory loss.  OBJ- Physical Exam General- Alert, Oriented, Affect-appropriate, Distress- none acute.+ overweight Skin- rash-none,  lesions- none, excoriation- none Lymphadenopathy- none Head- atraumatic            Eyes- Gross vision intact, PERRLA, conjunctivae and secretions clear            Ears- Hearing, canals-normal            Nose- Clear, no-Septal dev, mucus, polyps, erosion, perforation             Throat- Mallampati III , mucosa clear , drainage- none, tonsils- atrophic Neck- flexible , trachea midline, no stridor , thyroid nl, carotid no bruit Chest - symmetrical excursion , unlabored           Heart/CV- RRR , no  murmur , no gallop  , no rub, nl s1 s2                           - JVD- none , edema- none, stasis changes- none, varices- none           Lung- clear to P&A, wheeze- none, cough- none , dullness-none, rub- none           Chest wall-  Abd- Br/ Gen/ Rectal- Not done, not indicated Extrem- cyanosis- none, clubbing, none, atrophy- none, strength- nl Neuro- grossly intact to observation

## 2016-02-18 NOTE — Assessment & Plan Note (Signed)
Good CPAP compliance and control by report. We discussed when he would be eligible for placement machine.

## 2016-02-18 NOTE — Assessment & Plan Note (Signed)
He goes to gym regularly but is not achieving significant weight change.

## 2016-02-18 NOTE — Assessment & Plan Note (Signed)
Continued emphasis on good sleep habits and naps. Remains dependent on Adderall without abuse. Plan-refill Adderall

## 2016-03-10 ENCOUNTER — Telehealth: Payer: Self-pay | Admitting: Interventional Cardiology

## 2016-03-10 NOTE — Telephone Encounter (Signed)
New Message  Pt called states that he is having a lack of stamina. He states that he brought this up with his pulmonologist and the pulmonologist advised that he speaks with his cardiologist. Pt is requesting a call back to discuss this in depth.

## 2016-03-11 NOTE — Telephone Encounter (Signed)
Spoke with pt. Pt sts that he has been having increased with fatigue with exertion. Pt denies chest pain, sob, palpitation, pre-syncope or syncope, swelling, weight gain. Pt sts that he has had increased fatigue when climbing stairs or going up a slight incline over the last couple of weeks. He has to stop and rest before reaching his destination. He describes it as a "lack of stamina". He denies exertional chest pain or sob. [facility]. He is not able to use he treadmill. Pt does have have a dx of chronotropic incompetence. He was seen by Dr.Taylor in 2015 and was to f/u in 1 year. Offered pt an appt with Dr.Taylor for 5/12 @ 11:30am. Pt agreeable. Pt adv to arrive 15 min prior to appt. Pt verbalized understanding and voiced appreciation for the call

## 2016-03-12 ENCOUNTER — Telehealth: Payer: Self-pay | Admitting: Internal Medicine

## 2016-03-12 ENCOUNTER — Encounter: Payer: Self-pay | Admitting: Internal Medicine

## 2016-03-12 ENCOUNTER — Ambulatory Visit (INDEPENDENT_AMBULATORY_CARE_PROVIDER_SITE_OTHER): Payer: 59 | Admitting: Internal Medicine

## 2016-03-12 VITALS — BP 142/82 | HR 59 | Ht 68.0 in | Wt 242.2 lb

## 2016-03-12 DIAGNOSIS — I4589 Other specified conduction disorders: Secondary | ICD-10-CM

## 2016-03-12 NOTE — Progress Notes (Signed)
HPI Mr. Saah is referred today for ongoing evaluation of chronotropic incompetence. He is a pleasant 11I  yo man with obesity, HTN, and DM. He was found to have very marked chronotropic incompetence on 24 hour holter monitoring. Since I saw the patient over 2 years ago, he has been stable but gradually noting worsening ability to exercise. He has not had syncope. He doesn't get sob but feels like he has a power shortage with any exertion, especially walking. He notes that his HR only goes to 80/min no matter how hard he tries to exercise. When I saw the patient 2 years ago, his maximum HR was 60 while walking fast in our office and he tired quickly but refused to consider PPM. No Known Allergies   Current Outpatient Prescriptions  Medication Sig Dispense Refill  . amphetamine-dextroamphetamine (ADDERALL) 10 MG tablet Take 1-3 tablets (10-30 mg total) by mouth daily. As directed 90 tablet 0  . aspirin EC 81 MG tablet Take 81 mg by mouth daily.    [name] atorvastatin (LIPITOR) 40 MG tablet Take 20 mg by mouth daily before breakfast.     . glucosamine-chondroitin 500-400 MG tablet Take 2 tablets by mouth daily.    [name] lisinopril (PRINIVIL,ZESTRIL) 40 MG tablet TAKE 1 TABLET BY MOUTH EVERY DAY BEFORE BREAKFAST 90 tablet 3  . metFORMIN (GLUCOPHAGE) 500 MG tablet Take 2 tablets every morning and 1 tablet every evening    . Multiple Vitamin (MULTIVITAMIN) tablet Take 1 tablet by mouth daily.    . nitroGLYCERIN (NITROSTAT) 0.4 MG SL tablet Place 1 tablet (0.4 mg total) under the tongue every 5 (five) minutes as needed for chest pain. 25 tablet 3  . Omega-3 Fatty Acids (FISH OIL) 1000 MG CAPS Take 6 capsules by mouth daily.     . ONE TOUCH ULTRA TEST test strip Apply 1 strip topically as directed.     No current facility-administered medications for this visit.     Past Medical History  Diagnosis Date  . Coronary heart disease     Dr Pernell Dupre  . Hyperlipidemia   . OSA (obstructive sleep  apnea)     NPSG 12-13-98 AHI 22.7  . Narcolepsy     [name] 04-11-97; Mean Latency 1.75min, SOREM 2  . Complication of anesthesia   . PONV (postoperative nausea and vomiting)   . Shortness of breath     with exertion   . Diabetes mellitus without complication ([place])   . Arthritis     R shoulder, bone spur  . H/O cardiovascular stress test     perhaps last one was 2009  . Hypertension     saw Dr. Linard Millers last earl;y- 2014, cardiac cath. last done ?2009, blocks seen didn't require any intervention at that point.  [name] Heart murmur     12/29/11 echo: mild MR, no AS, trivial TR    ROS:   All systems reviewed and negative except as noted in the HPI.   Past Surgical History  Procedure Laterality Date  . Tonsillectomy    . Nasal fracture surgery    . Cardiac catheterization  2009?  [name] Shoulder arthroscopy with rotator cuff repair and subacromial decompression Right 08/16/2013    Procedure: SHOULDER ARTHROSCOPY WITH ROTATOR CUFF REPAIR AND SUBACROMIAL DECOMPRESSION;  Surgeon: [name], MD;  Location: [place];  Service: Orthopedics;  Laterality: Right;  Right shoulder arthroscopy rotator cuff repair, subacromial decompression.  . Left and right heart catheterization with coronary angiogram  N/A 06/05/2014    Procedure: LEFT AND RIGHT HEART CATHETERIZATION WITH CORONARY ANGIOGRAM;  Surgeon: Sinclair Grooms, MD;  Location: [facility] CATH LAB;  Service: Cardiovascular;  Laterality: N/A;     Family History  Problem Relation Age of Onset  . Sleep apnea Sister   . Colon cancer Sister      Social History   Social History  . Marital Status: Married    Spouse Name: N/A  . Number of Children: N/A  . Years of Education: N/A   Occupational History  . 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 [place] [employer]    Social History Main Topics  . Smoking status: Former Smoker -- 2.50 packs/day    Types: Cigarettes    Quit date: 06/05/1967  . Smokeless tobacco: Not on file  . Alcohol Use: Yes     Comment:  "not often", not regular, on occasion   . Drug Use: No  . Sexual Activity: Not on file   Other Topics Concern  . Not on file   Social History Narrative     BP 142/82 mmHg  Pulse 59  Ht 5\' 8"  (1.727 m)  Wt 242 lb 3.2 oz (109.861 kg)  BMI 36.83 kg/m2  SpO2 98%  Physical Exam:  Well appearing 76 yo man, NAD HEENT: Unremarkable Neck:  6 cm JVD, no thyromegally Back:  No CVA tenderness Lungs:  Clear with no wheezes HEART:  Regular brady rhythm, no murmurs, no rubs, no clicks Abd:  soft, positive bowel sounds, no organomegally, no rebound, no guarding Ext:  2 plus pulses, no edema, no cyanosis, no clubbing Skin:  No rashes no nodules Neuro:  CN II through XII intact, motor grossly intact  Assess/Plan: 1. 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 The risks/benefits/goals/expectations of the procedure have been reviewed. He will call us if he wishes to proceed with PPM. I have quoted him approx. 2 chances in 3 of functional improvement from the procedure. 2. Obesity - we discussed the importance of increasing physical activity and weight loss. 3. HTN - his blood pressure is slightly elevated. I would consider adding a beta blocker once his PPM is placed. 4. Diastolic heart failure - he is class 3 but this may well be over estimated because of chronotropic incompetence.   Mikle Bosworth.D.

## 2016-03-12 NOTE — Telephone Encounter (Signed)
Please report to the [facility] at 5:30am on 03/25/16 Do not eat or drink after midnight the night before your procedure Do not take any medications the morning of procedure  You will spend one night in the hospital  I have left the patient fa message to come to the office on 5/18 for pre-op labs and pick up his instructions

## 2016-03-12 NOTE — Telephone Encounter (Signed)
Jordan [name] is calling to give you the date for scheduling the pacemaker . May  25,2017.  Thanks

## 2016-03-12 NOTE — Patient Instructions (Addendum)
Medication Instructions:  Your physician recommends that you continue on your current medications as directed. Please refer to the Current Medication list given to you today.   Labwork: Your physician recommends that you return for lab work on 03/18/16---you do not have to be fasting    Testing/Procedures: Your physician has recommended that you have a pacemaker inserted. A pacemaker is a small device that is placed under the skin of your chest or abdomen to help control abnormal heart rhythms. This device uses electrical pulses to prompt the heart to beat at a normal rate. Pacemakers are used to treat heart rhythms that are too slow. Wire (leads) are attached to the pacemaker that goes into the chambers of you heart. This is done in the hospital and usually requires and overnight stay. Please see the instruction sheet given to you today for more information.--03/25/16    Please report to the [facility] at 5:30am on 03/25/16 Do not eat or drink after midnight the night before your procedure Do not take any medications the morning of procedure   You will spend one night in the hospital  Follow-Up:  Your physician recommends that you schedule a follow-up appointment in:10-14 days from 03/25/16 in device clinic for wound check and 3 months with Dr Lovena Le   Any Other Special Instructions Will Be Listed Below (If Applicable).     If you need a refill on your cardiac medications before your next appointment, please call your pharmacy.

## 2016-03-18 ENCOUNTER — Telehealth: Payer: Self-pay | Admitting: Internal Medicine

## 2016-03-18 ENCOUNTER — Other Ambulatory Visit (INDEPENDENT_AMBULATORY_CARE_PROVIDER_SITE_OTHER): Payer: 59 | Admitting: *Deleted

## 2016-03-18 DIAGNOSIS — I4589 Other specified conduction disorders: Secondary | ICD-10-CM | POA: Diagnosis not present

## 2016-03-18 LAB — CBC WITH DIFFERENTIAL/PLATELET
BASOS ABS: 84 {cells}/uL (ref 0–200)
Basophils Relative: 1 %
EOS PCT: 2 %
Eosinophils Absolute: 168 cells/uL (ref 15–500)
HCT: 43.4 % (ref 38.5–50.0)
Hemoglobin: 14.7 g/dL (ref 13.2–17.1)
Lymphocytes Relative: 31 %
Lymphs Abs: 2604 cells/uL (ref 850–3900)
MCH: 29.9 pg (ref 27.0–33.0)
MCHC: 33.9 g/dL (ref 32.0–36.0)
MCV: 88.2 fL (ref 80.0–100.0)
MONOS PCT: 12 %
MPV: 9.3 fL (ref 7.5–12.5)
Monocytes Absolute: 1008 cells/uL — ABNORMAL HIGH (ref 200–950)
NEUTROS ABS: 4536 {cells}/uL (ref 1500–7800)
NEUTROS PCT: 54 %
PLATELETS: 241 10*3/uL (ref 140–400)
RBC: 4.92 MIL/uL (ref 4.20–5.80)
RDW: 13.8 % (ref 11.0–15.0)
WBC: 8.4 10*3/uL (ref 3.8–10.8)

## 2016-03-18 LAB — BASIC METABOLIC PANEL
BUN: 22 mg/dL (ref 7–25)
CALCIUM: 9.2 mg/dL (ref 8.6–10.3)
CO2: 24 mmol/L (ref 20–31)
CREATININE: 1.16 mg/dL (ref 0.70–1.18)
Chloride: 104 mmol/L (ref 98–110)
Glucose, Bld: 180 mg/dL — ABNORMAL HIGH (ref 65–99)
Potassium: 4.7 mmol/L (ref 3.5–5.3)
Sodium: 139 mmol/L (ref 135–146)

## 2016-03-18 NOTE — Telephone Encounter (Signed)
New problem   Pt returning calling to nurse.

## 2016-03-18 NOTE — Telephone Encounter (Signed)
GAve patient his instruction sheet while here for labs

## 2016-03-24 HISTORY — PX: INSERT / REPLACE / REMOVE PACEMAKER: SUR710

## 2016-03-25 ENCOUNTER — Ambulatory Visit (HOSPITAL_COMMUNITY)
Admission: RE | Admit: 2016-03-25 | Discharge: 2016-03-26 | Disposition: A | Discharge status: Home / Self Care | Payer: 59 | Source: Ambulatory Visit | Attending: Internal Medicine | Admitting: Internal Medicine

## 2016-03-25 ENCOUNTER — Encounter (HOSPITAL_COMMUNITY): Payer: Self-pay | Admitting: General Practice

## 2016-03-25 ENCOUNTER — Encounter (HOSPITAL_COMMUNITY)
Admission: RE | Disposition: A | Discharge status: Home / Self Care | Payer: Self-pay | Source: Ambulatory Visit | Attending: Internal Medicine

## 2016-03-25 DIAGNOSIS — E785 Hyperlipidemia, unspecified: Secondary | ICD-10-CM | POA: Diagnosis not present

## 2016-03-25 DIAGNOSIS — Z6836 Body mass index (BMI) 36.0-36.9, adult: Secondary | ICD-10-CM | POA: Insufficient documentation

## 2016-03-25 DIAGNOSIS — Z79899 Other long term (current) drug therapy: Secondary | ICD-10-CM | POA: Insufficient documentation

## 2016-03-25 DIAGNOSIS — I110 Hypertensive heart disease with heart failure: Secondary | ICD-10-CM | POA: Insufficient documentation

## 2016-03-25 DIAGNOSIS — E669 Obesity, unspecified: Secondary | ICD-10-CM | POA: Diagnosis not present

## 2016-03-25 DIAGNOSIS — I5032 Chronic diastolic (congestive) heart failure: Secondary | ICD-10-CM | POA: Diagnosis not present

## 2016-03-25 DIAGNOSIS — Z87891 Personal history of nicotine dependence: Secondary | ICD-10-CM | POA: Diagnosis not present

## 2016-03-25 DIAGNOSIS — Z7982 Long term (current) use of aspirin: Secondary | ICD-10-CM | POA: Insufficient documentation

## 2016-03-25 DIAGNOSIS — I2510 Atherosclerotic heart disease of native coronary artery without angina pectoris: Secondary | ICD-10-CM | POA: Insufficient documentation

## 2016-03-25 DIAGNOSIS — Z950 Presence of cardiac pacemaker: Secondary | ICD-10-CM

## 2016-03-25 DIAGNOSIS — Z7984 Long term (current) use of oral hypoglycemic drugs: Secondary | ICD-10-CM | POA: Insufficient documentation

## 2016-03-25 DIAGNOSIS — E119 Type 2 diabetes mellitus without complications: Secondary | ICD-10-CM | POA: Insufficient documentation

## 2016-03-25 DIAGNOSIS — I495 Sick sinus syndrome: Secondary | ICD-10-CM | POA: Diagnosis not present

## 2016-03-25 DIAGNOSIS — I4589 Other specified conduction disorders: Secondary | ICD-10-CM | POA: Diagnosis present

## 2016-03-25 HISTORY — DX: Low back pain, unspecified: M54.50

## 2016-03-25 HISTORY — DX: Low back pain: M54.5

## 2016-03-25 HISTORY — DX: Personal history of other diseases of the musculoskeletal system and connective tissue: Z87.39

## 2016-03-25 HISTORY — DX: Presence of cardiac pacemaker: Z95.0

## 2016-03-25 HISTORY — PX: EP IMPLANTABLE DEVICE: SHX172B

## 2016-03-25 HISTORY — DX: Type 2 diabetes mellitus without complications: E11.9

## 2016-03-25 HISTORY — DX: Obstructive sleep apnea (adult) (pediatric): G47.33

## 2016-03-25 HISTORY — DX: Other chronic pain: G89.29

## 2016-03-25 HISTORY — DX: Obstructive sleep apnea (adult) (pediatric): Z99.89

## 2016-03-25 LAB — GLUCOSE, CAPILLARY
Glucose-Capillary: 151 mg/dL — ABNORMAL HIGH (ref 65–99)
Glucose-Capillary: 128 mg/dL — ABNORMAL HIGH (ref 65–99)
Glucose-Capillary: 238 mg/dL — ABNORMAL HIGH (ref 65–99)
Glucose-Capillary: 198 mg/dL — ABNORMAL HIGH (ref 65–99)

## 2016-03-25 LAB — SURGICAL PCR SCREEN
MRSA, PCR: NEGATIVE
Staphylococcus aureus: NEGATIVE

## 2016-03-25 SURGERY — PACEMAKER IMPLANT

## 2016-03-25 MED ORDER — SODIUM CHLORIDE 0.9 % IV SOLN
INTRAVENOUS | Status: DC | PRN
  Administered 2016-03-25: 10 mL/h via INTRAVENOUS

## 2016-03-25 MED ORDER — ONDANSETRON HCL 4 MG/2ML IJ SOLN: 4.0000 mg | Freq: Four times a day (QID) | INTRAMUSCULAR | Status: DC | PRN

## 2016-03-25 MED ORDER — METFORMIN HCL 500 MG PO TABS: 500.0000 mg | ORAL_TABLET | Freq: Every day | ORAL | Status: DC

## 2016-03-25 MED ORDER — LIDOCAINE HCL (PF) 1 % IJ SOLN
INTRAMUSCULAR | Status: AC
Start: 2016-03-25 — End: 2016-03-25
  Filled 2016-03-25: qty 60

## 2016-03-25 MED ORDER — HEPARIN (PORCINE) IN NACL 2-0.9 UNIT/ML-% IJ SOLN
INTRAMUSCULAR | Status: AC
  Filled 2016-03-25: qty 500

## 2016-03-25 MED ORDER — ADULT MULTIVITAMIN W/MINERALS CH
1.0000 | ORAL_TABLET | Freq: Every day | ORAL | Status: DC
  Administered 2016-03-25 – 2016-03-26 (×2): 1 via ORAL
  Filled 2016-03-25 (×3): qty 1

## 2016-03-25 MED ORDER — METFORMIN HCL 500 MG PO TABS: 1000.0000 mg | ORAL_TABLET | Freq: Every day | ORAL | Status: DC

## 2016-03-25 MED ORDER — MIDAZOLAM HCL 5 MG/5ML IJ SOLN
INTRAMUSCULAR | Status: DC | PRN
  Administered 2016-03-25 (×4): 1 mg via INTRAVENOUS

## 2016-03-25 MED ORDER — METFORMIN HCL 500 MG PO TABS
1000.0000 mg | ORAL_TABLET | Freq: Every day | ORAL | Status: DC
  Filled 2016-03-25: qty 2

## 2016-03-25 MED ORDER — ATORVASTATIN CALCIUM 20 MG PO TABS
20.0000 mg | ORAL_TABLET | Freq: Every day | ORAL | Status: DC
  Administered 2016-03-25 – 2016-03-26 (×2): 20 mg via ORAL
  Filled 2016-03-25 (×2): qty 1

## 2016-03-25 MED ORDER — FENTANYL CITRATE (PF) 100 MCG/2ML IJ SOLN
INTRAMUSCULAR | Status: DC | PRN
  Administered 2016-03-25: 25 ug via INTRAVENOUS
  Administered 2016-03-25 (×2): 12.5 ug via INTRAVENOUS

## 2016-03-25 MED ORDER — SODIUM CHLORIDE 0.9 % IR SOLN
80.0000 mg | Status: AC
  Administered 2016-03-25: 80 mg

## 2016-03-25 MED ORDER — LISINOPRIL 5 MG PO TABS
5.0000 mg | ORAL_TABLET | Freq: Every day | ORAL | Status: DC
Start: 2016-03-25 — End: 2016-03-26
  Administered 2016-03-25 – 2016-03-26 (×2): 5 mg via ORAL
  Filled 2016-03-25 (×2): qty 1

## 2016-03-25 MED ORDER — NITROGLYCERIN 0.4 MG SL SUBL: 0.4000 mg | SUBLINGUAL_TABLET | SUBLINGUAL | Status: DC | PRN

## 2016-03-25 MED ORDER — SODIUM CHLORIDE 0.9 % IV SOLN
INTRAVENOUS | Status: DC
  Administered 2016-03-25: 07:00:00 via INTRAVENOUS

## 2016-03-25 MED ORDER — CEFAZOLIN SODIUM-DEXTROSE 2-4 GM/100ML-% IV SOLN
2.0000 g | INTRAVENOUS | Status: AC
  Administered 2016-03-25: 2 g via INTRAVENOUS

## 2016-03-25 MED ORDER — CEFAZOLIN SODIUM-DEXTROSE 2-4 GM/100ML-% IV SOLN
2.0000 g | Freq: Four times a day (QID) | INTRAVENOUS | Status: DC
  Administered 2016-03-25: 2 g via INTRAVENOUS
  Filled 2016-03-25 (×3): qty 100

## 2016-03-25 MED ORDER — GLUCOSAMINE-CHONDROITIN 500-400 MG PO TABS: 2.0000 | ORAL_TABLET | Freq: Every day | ORAL | Status: DC

## 2016-03-25 MED ORDER — FENTANYL CITRATE (PF) 100 MCG/2ML IJ SOLN
INTRAMUSCULAR | Status: AC
  Filled 2016-03-25: qty 2

## 2016-03-25 MED ORDER — CEFAZOLIN SODIUM-DEXTROSE 2-4 GM/100ML-% IV SOLN
INTRAVENOUS | Status: AC
  Filled 2016-03-25: qty 100

## 2016-03-25 MED ORDER — MUPIROCIN 2 % EX OINT
TOPICAL_OINTMENT | Freq: Once | CUTANEOUS | Status: AC
  Administered 2016-03-25: 1 via NASAL

## 2016-03-25 MED ORDER — HEPARIN (PORCINE) IN NACL 2-0.9 UNIT/ML-% IJ SOLN
INTRAMUSCULAR | Status: DC | PRN
  Administered 2016-03-25: 09:00:00

## 2016-03-25 MED ORDER — ACETAMINOPHEN 325 MG PO TABS
325.0000 mg | ORAL_TABLET | ORAL | Status: DC | PRN
  Administered 2016-03-25: 650 mg via ORAL
  Filled 2016-03-25: qty 2

## 2016-03-25 MED ORDER — MIDAZOLAM HCL 5 MG/5ML IJ SOLN
INTRAMUSCULAR | Status: AC
Start: 2016-03-25 — End: 2016-03-25
  Filled 2016-03-25: qty 5

## 2016-03-25 MED ORDER — ASPIRIN EC 81 MG PO TBEC
81.0000 mg | DELAYED_RELEASE_TABLET | Freq: Every day | ORAL | Status: DC
  Administered 2016-03-25 – 2016-03-26 (×2): 81 mg via ORAL
  Filled 2016-03-25 (×2): qty 1

## 2016-03-25 MED ORDER — LIDOCAINE HCL (PF) 1 % IJ SOLN
INTRAMUSCULAR | Status: DC | PRN
  Administered 2016-03-25: 57 mL

## 2016-03-25 MED ORDER — CHLORHEXIDINE GLUCONATE 4 % EX LIQD: 60.0000 mL | Freq: Once | CUTANEOUS | Status: DC

## 2016-03-25 MED ORDER — MUPIROCIN 2 % EX OINT
TOPICAL_OINTMENT | CUTANEOUS | Status: AC
  Administered 2016-03-25: 1 via NASAL
  Filled 2016-03-25: qty 22

## 2016-03-25 MED ORDER — CEFAZOLIN SODIUM-DEXTROSE 2-4 GM/100ML-% IV SOLN
2.0000 g | Freq: Four times a day (QID) | INTRAVENOUS | Status: AC
  Administered 2016-03-25 – 2016-03-26 (×2): 2 g via INTRAVENOUS
  Filled 2016-03-25 (×2): qty 100

## 2016-03-25 MED ORDER — SODIUM CHLORIDE 0.9 % IR SOLN
Status: AC
  Filled 2016-03-25: qty 2

## 2016-03-25 SURGICAL SUPPLY — 7 items
CABLE SURGICAL S-101-97-12 (CABLE) ×2 IMPLANT
LEAD SOLIA S PRO MRI 45 (Lead) ×2 IMPLANT
LEAD SOLIA S PRO MRI 53 (Lead) ×2 IMPLANT
PAD DEFIB LIFELINK (PAD) ×2 IMPLANT
PPM ELUNA DR-T 394969 (Pacemaker) ×2 IMPLANT
SHEATH CLASSIC 7F (SHEATH) ×4 IMPLANT
TRAY PACEMAKER INSERTION (PACKS) ×2 IMPLANT

## 2016-03-25 NOTE — Discharge Instructions (Signed)
° ° °  Supplemental Discharge Instructions for  Pacemaker/Defibrillator Patients  Activity No heavy lifting or vigorous activity with your left/right arm for 6 to 8 weeks.  Do not raise your left/right arm above your head for one week.  Gradually raise your affected arm as drawn below.             03/29/16                     03/30/16                     03/31/16                  04/01/16 __  NO DRIVING for 1 week    ; you may begin driving on   [id number]  .  WOUND CARE - Keep the wound area clean and dry.  Do not get this area wet for one week. No showers for one week; you may shower on 04/01/16    . - The tape/steri-strips on your wound will fall off; do not pull them off.  No bandage is needed on the site.  DO  NOT apply any creams, oils, or ointments to the wound area. - If you notice any drainage or discharge from the wound, any swelling or bruising at the site, or you develop a fever > 101? F after you are discharged home, call the office at once.  Special Instructions - You are still able to use cellular telephones; use the ear opposite the side where you have your pacemaker/defibrillator.  Avoid carrying your cellular phone near your device. - When traveling through airports, show security personnel your identification card to avoid being screened in the metal detectors.  Ask the security personnel to use the hand wand. - Avoid arc welding equipment, MRI testing (magnetic resonance imaging), TENS units (transcutaneous nerve stimulators).  Call the office for questions about other devices. - Avoid electrical appliances that are in poor condition or are not properly grounded. - Microwave ovens are safe to be near or to operate.  Additional information for defibrillator patients should your device go off: - If your device goes off ONCE and you feel fine afterward, notify the device clinic nurses. - If your device goes off ONCE and you do not feel well afterward, call 911. - If your device goes  off TWICE, call 911. - If your device goes off THREE times in one day, call 911.  DO NOT DRIVE YOURSELF OR A FAMILY MEMBER WITH A DEFIBRILLATOR TO THE HOSPITAL--CALL 911.

## 2016-03-25 NOTE — Progress Notes (Signed)
Orthopedic Tech Progress Note Patient Details:  Jordan [name] 04/26/1941 [id number]  Ortho Devices Type of Ortho Device: Arm sling Ortho Device/Splint Interventions: Application   [place] Pink 03/25/2016, 9:31 AM

## 2016-03-25 NOTE — H&P (View-Only) (Signed)
HPI Jordan [name] is referred today for ongoing evaluation of chronotropic incompetence. He is a pleasant 32I  yo man with obesity, HTN, and DM. He was found to have very marked chronotropic incompetence on 24 hour holter monitoring. Since I saw the patient over 2 years ago, he has been stable but gradually noting worsening ability to exercise. He has not had syncope. He doesn't get sob but feels like he has a power shortage with any exertion, especially walking. He notes that his HR only goes to 80/min no matter how hard he tries to exercise. When I saw the patient 2 years ago, his maximum HR was 60 while walking fast in our office and he tired quickly but refused to consider PPM. No Known Allergies   Current Outpatient Prescriptions  Medication Sig Dispense Refill  . amphetamine-dextroamphetamine (ADDERALL) 10 MG tablet Take 1-3 tablets (10-30 mg total) by mouth daily. As directed 90 tablet 0  . aspirin EC 81 MG tablet Take 81 mg by mouth daily.    [name] atorvastatin (LIPITOR) 40 MG tablet Take 20 mg by mouth daily before breakfast.     . glucosamine-chondroitin 500-400 MG tablet Take 2 tablets by mouth daily.    [name] lisinopril (PRINIVIL,ZESTRIL) 40 MG tablet TAKE 1 TABLET BY MOUTH EVERY DAY BEFORE BREAKFAST 90 tablet 3  . metFORMIN (GLUCOPHAGE) 500 MG tablet Take 2 tablets every morning and 1 tablet every evening    . Multiple Vitamin (MULTIVITAMIN) tablet Take 1 tablet by mouth daily.    . nitroGLYCERIN (NITROSTAT) 0.4 MG SL tablet Place 1 tablet (0.4 mg total) under the tongue every 5 (five) minutes as needed for chest pain. 25 tablet 3  . Omega-3 Fatty Acids (FISH OIL) 1000 MG CAPS Take 6 capsules by mouth daily.     . ONE TOUCH ULTRA TEST test strip Apply 1 strip topically as directed.     No current facility-administered medications for this visit.     Past Medical History  Diagnosis Date  . Coronary heart disease     Dr Pernell Dupre  . Hyperlipidemia   . OSA (obstructive sleep  apnea)     NPSG 12-13-98 AHI 22.7  . Narcolepsy     [name] 04-11-97; Mean Latency 1.44min, SOREM 2  . Complication of anesthesia   . PONV (postoperative nausea and vomiting)   . Shortness of breath     with exertion   . Diabetes mellitus without complication ([place])   . Arthritis     R shoulder, bone spur  . H/O cardiovascular stress test     perhaps last one was 2009  . Hypertension     saw Dr. Linard Millers last earl;y- 2014, cardiac cath. last done ?2009, blocks seen didn't require any intervention at that point.  [name] Heart murmur     12/29/11 echo: mild MR, no AS, trivial TR    ROS:   All systems reviewed and negative except as noted in the HPI.   Past Surgical History  Procedure Laterality Date  . Tonsillectomy    . Nasal fracture surgery    . Cardiac catheterization  2009?  [name] Shoulder arthroscopy with rotator cuff repair and subacromial decompression Right 08/16/2013    Procedure: SHOULDER ARTHROSCOPY WITH ROTATOR CUFF REPAIR AND SUBACROMIAL DECOMPRESSION;  Surgeon: [name], MD;  Location: [place];  Service: Orthopedics;  Laterality: Right;  Right shoulder arthroscopy rotator cuff repair, subacromial decompression.  . Left and right heart catheterization with coronary angiogram  N/A 06/05/2014    Procedure: LEFT AND RIGHT HEART CATHETERIZATION WITH CORONARY ANGIOGRAM;  Surgeon: Sinclair Grooms, MD;  Location: [facility] CATH LAB;  Service: Cardiovascular;  Laterality: N/A;     Family History  Problem Relation Age of Onset  . Sleep apnea Sister   . Colon cancer Sister      Social History   Social History  . Marital Status: Married    Spouse Name: N/A  . Number of Children: N/A  . Years of Education: N/A   Occupational History  . 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 [place] [employer]    Social History Main Topics  . Smoking status: Former Smoker -- 2.50 packs/day    Types: Cigarettes    Quit date: 06/05/1967  . Smokeless tobacco: Not on file  . Alcohol Use: Yes     Comment:  "not often", not regular, on occasion   . Drug Use: No  . Sexual Activity: Not on file   Other Topics Concern  . Not on file   Social History Narrative     BP 142/82 mmHg  Pulse 59  Ht 5\' 8"  (1.727 m)  Wt 242 lb 3.2 oz (109.861 kg)  BMI 36.83 kg/m2  SpO2 98%  Physical Exam:  Well appearing 75 yo man, NAD HEENT: Unremarkable Neck:  6 cm JVD, no thyromegally Back:  No CVA tenderness Lungs:  Clear with no wheezes HEART:  Regular brady rhythm, no murmurs, no rubs, no clicks Abd:  soft, positive bowel sounds, no organomegally, no rebound, no guarding Ext:  2 plus pulses, no edema, no cyanosis, no clubbing Skin:  No rashes no nodules Neuro:  CN II through XII intact, motor grossly intact  Assess/Plan: 1. 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 The risks/benefits/goals/expectations of the procedure have been reviewed. He will call us if he wishes to proceed with PPM. I have quoted him approx. 2 chances in 3 of functional improvement from the procedure. 2. Obesity - we discussed the importance of increasing physical activity and weight loss. 3. HTN - his blood pressure is slightly elevated. I would consider adding a beta blocker once his PPM is placed. 4. Diastolic heart failure - he is class 3 but this may well be over estimated because of chronotropic incompetence.   Jordan [name].D.

## 2016-03-25 NOTE — Interval H&P Note (Signed)
History and Physical Interval Note:  03/25/2016 8:04 AM  Jordan [name]  has presented today for surgery, with the diagnosis of tropic incompetence  The various methods of treatment have been discussed with the patient and family. After consideration of risks, benefits and other options for treatment, the patient has consented to  Procedure(s): Pacemaker Implant - Dual Chamber (N/A) as a surgical intervention .  The patient's history has been reviewed, patient examined, no change in status, stable for surgery.  I have reviewed the patient's chart and labs.  Questions were answered to the patient's satisfaction.     Cristopher Peru

## 2016-03-25 NOTE — Discharge Summary (Signed)
ELECTROPHYSIOLOGY PROCEDURE DISCHARGE SUMMARY    Patient ID: Jordan [name],  MRN: [id number], DOB/AGE: 76-Oct-1942 76 y.o.  Admit date: 03/25/2016 Discharge date: 03/26/16  Primary Care Physician: [name], MD Primary Cardiologist: Dr. Tamala Julian Electrophysiologist: Dr. Lovena Le  Primary Discharge Diagnosis:  1. Symptomatic Marked Chronotropic incompetence 2. Sinus node dyfunction  Secondary Discharge Diagnosis:  1. CAD 2. Chronic CHF (diastolic) 3. HLD 4. DM 5. HTN  No Known Allergies   Procedures This Admission:  1.  Implantation of a Biotronik dual chamber PPM on 03/25/16 by Dr Lovena Le.  The patient received a Biotronik MRI compatible (serial number [id number]) pacemaker, and Biotronik MRI compatible (serial number [id number]) right atrial lead and a Biotronik MRI compatible (serial number [id number]) right ventricular lead.  There were no immediate post procedure complications. 2.  CXR on 03/26/16 demonstrated no pneumothorax status post device implantation.   Brief HPI: Jordan [name] is a 75 y.o. male was referred to electrophysiology in the outpatient setting for consideration of PPM implantation.  Past medical history includes sinus node dysfunction with symptomatic chronotropic incompentence, CAD, DM, HTN.  The patient has had symptomatic bradycardia without reversible causes identified.  Risks, benefits, and alternatives to PPM implantation were reviewed with the patient who wished to proceed.   Hospital Course:  The patient was admitted and underwent implantation of a PPM with details as outlined above.  He  was monitored on telemetry overnight which demonstrated SR, pacing.  Left chest was without hematoma or ecchymosis.  The device was interrogated and found to be functioning normally.  CXR was obtained and demonstrated no pneumothorax status post device implantation.  Wound care, arm mobility, and restrictions were reviewed with the patient.  The patient was examined  by Dr. Lovena Le and considered stable for discharge to home.    Physical Exam: Filed Vitals:   03/25/16 1130 03/25/16 1314 03/25/16 2021 03/26/16 0518  BP: 100/71 142/65 109/69 103/70  Pulse:  89 69 72  Temp:  98.2 F (36.8 C) 98.7 F (37.1 C) 97.8 F (36.6 C)  TempSrc:  Oral Oral Oral  Resp:  18 16 16   Height:      Weight:      SpO2:  100% 94% 94%     GEN- The patient is well appearing, alert and oriented x 3 today.   HEENT: normocephalic, atraumatic; sclera clear, conjunctiva pink; hearing intact; oropharynx clear; neck supple, no JVP Lungs- Clear to ausculation bilaterally, normal work of breathing.  No wheezes, rales, rhonchi Heart- Regular rate and rhythm, no murmurs, rubs or gallops, PMI not laterally displaced GI- soft, non-tender, non-distended Extremities- no clubbing, cyanosis, or edema MS- no significant deformity or atrophy Skin- warm and dry, no rash or lesion, left chest without hematoma/ecchymosis Psych- euthymic mood, full affect Neuro- no gross deficits   Labs:   Lab Results  Component Value Date   WBC 8.4 03/18/2016   HGB 14.7 03/18/2016   HCT 43.4 03/18/2016   MCV 88.2 03/18/2016   PLT 241 03/18/2016   No results for input(s): NA, K, CL, CO2, BUN, CREATININE, CALCIUM, PROT, BILITOT, ALKPHOS, ALT, AST, GLUCOSE in the last 168 hours.  Invalid input(s): LABALBU  Discharge Medications:    Medication List    TAKE these medications        amphetamine-dextroamphetamine 10 MG tablet  Commonly known as:  ADDERALL  Take 1-3 tablets (10-30 mg total) by mouth daily. As directed     aspirin EC 81 MG tablet  Take  81 mg by mouth daily.     atorvastatin 40 MG tablet  Commonly known as:  LIPITOR  Take 20 mg by mouth daily before breakfast.     Fish Oil 1000 MG Caps  Take 6 capsules by mouth daily.     glucosamine-chondroitin 500-400 MG tablet  Take 2 tablets by mouth daily.     lisinopril 40 MG tablet  Commonly known as:  PRINIVIL,ZESTRIL  TAKE 1  TABLET BY MOUTH EVERY DAY BEFORE BREAKFAST     metFORMIN 500 MG tablet  Commonly known as:  GLUCOPHAGE  Take 500-1,000 mg by mouth 2 (two) times daily with a meal. Take 2 tablets every morning and 1 tablet every evening     multivitamin tablet  Take 1 tablet by mouth daily.     nitroGLYCERIN 0.4 MG SL tablet  Commonly known as:  NITROSTAT  Place 1 tablet (0.4 mg total) under the tongue every 5 (five) minutes as needed for chest pain.     ONE TOUCH ULTRA TEST test strip  Generic drug:  glucose blood  Apply 1 strip topically as directed.        Disposition:  Home Discharge Instructions    Diet - low sodium heart healthy    Complete by:  As directed      Increase activity slowly    Complete by:  As directed           Follow-up Information    Follow up with [facility] On 04/05/2016.   Specialty:  Cardiology   Why:  10:00AM, wound check   Contact information:   [address], [place] [phone]      Follow up with Cristopher Peru, MD On 06/16/2016.   Specialty:  Cardiology   Why:  10:40AM   Contact information:   [address] [place] [address] [phone]       Duration of Discharge Encounter: Greater than 30 minutes including physician time.  Venetia Night, PA-C 03/26/2016 8:41 AM  EP Attending  Patient seen and examined. Agree with above. He is doing well s/p PPM insertion. Device is working normally. [place] for discharge. Usual followup.  Mikle Bosworth.D.

## 2016-03-26 ENCOUNTER — Ambulatory Visit (HOSPITAL_COMMUNITY): Payer: 59

## 2016-03-26 DIAGNOSIS — I2510 Atherosclerotic heart disease of native coronary artery without angina pectoris: Secondary | ICD-10-CM | POA: Diagnosis not present

## 2016-03-26 DIAGNOSIS — E785 Hyperlipidemia, unspecified: Secondary | ICD-10-CM | POA: Diagnosis not present

## 2016-03-26 DIAGNOSIS — I5032 Chronic diastolic (congestive) heart failure: Secondary | ICD-10-CM | POA: Diagnosis not present

## 2016-03-26 DIAGNOSIS — I495 Sick sinus syndrome: Secondary | ICD-10-CM | POA: Diagnosis not present

## 2016-03-26 DIAGNOSIS — Z950 Presence of cardiac pacemaker: Secondary | ICD-10-CM | POA: Diagnosis not present

## 2016-03-26 MED FILL — Sodium Chloride Irrigation Soln 0.9%: Qty: 500 | Status: AC

## 2016-03-26 MED FILL — Gentamicin Sulfate Inj 40 MG/ML: INTRAMUSCULAR | Qty: 2 | Status: AC

## 2016-03-26 NOTE — Progress Notes (Signed)
03/26/2016 4:35 PM Discharge AVS meds taken today and those due this evening reviewed.  Follow-up appointments and when to call md reviewed.  D/C IV and TELE.  Questions and concerns addressed.   D/C home per orders. [place]

## 2016-03-31 ENCOUNTER — Other Ambulatory Visit: Payer: Self-pay | Admitting: Interventional Cardiology

## 2016-03-31 DIAGNOSIS — E1165 Type 2 diabetes mellitus with hyperglycemia: Secondary | ICD-10-CM | POA: Diagnosis not present

## 2016-03-31 DIAGNOSIS — Z950 Presence of cardiac pacemaker: Secondary | ICD-10-CM | POA: Diagnosis not present

## 2016-04-05 ENCOUNTER — Encounter: Payer: Self-pay | Admitting: Internal Medicine

## 2016-04-05 ENCOUNTER — Ambulatory Visit (INDEPENDENT_AMBULATORY_CARE_PROVIDER_SITE_OTHER): Payer: 59 | Admitting: *Deleted

## 2016-04-05 DIAGNOSIS — Z950 Presence of cardiac pacemaker: Secondary | ICD-10-CM | POA: Diagnosis not present

## 2016-04-05 DIAGNOSIS — I4589 Other specified conduction disorders: Secondary | ICD-10-CM

## 2016-04-05 LAB — CUP PACEART INCLINIC DEVICE CHECK
Brady Statistic RV Percent Paced: 3 %
Date Time Interrogation Session: 20170605142000
Implantable Lead Implant Date: 20170525
Implantable Lead Location: 753859
Implantable Lead Location: 753860
Implantable Lead Model: 377
Implantable Lead Serial Number: 49470224
Lead Channel Impedance Value: 507 Ohm
Lead Channel Impedance Value: 565 Ohm
Lead Channel Pacing Threshold Amplitude: 1 V
Lead Channel Pacing Threshold Pulse Width: 0.4 ms
Lead Channel Pacing Threshold Pulse Width: 0.4 ms
Lead Channel Sensing Intrinsic Amplitude: 11.3 mV
Lead Channel Sensing Intrinsic Amplitude: 5.9 mV
Lead Channel Setting Pacing Amplitude: 3 V
Lead Channel Setting Pacing Pulse Width: 0.4 ms
MDC IDC LEAD IMPLANT DT: 20170525
MDC IDC LEAD SERIAL: 49485613
MDC IDC MSMT LEADCHNL RA PACING THRESHOLD AMPLITUDE: 1.2 V
MDC IDC MSMT LEADCHNL RA PACING THRESHOLD AMPLITUDE: 1.2 V
MDC IDC MSMT LEADCHNL RA PACING THRESHOLD PULSEWIDTH: 0.4 ms
MDC IDC MSMT LEADCHNL RA PACING THRESHOLD PULSEWIDTH: 0.4 ms
MDC IDC MSMT LEADCHNL RA SENSING INTR AMPL: 8.7 mV
MDC IDC MSMT LEADCHNL RV PACING THRESHOLD AMPLITUDE: 1.2 V
MDC IDC MSMT LEADCHNL RV PACING THRESHOLD AMPLITUDE: 1.2 V
MDC IDC MSMT LEADCHNL RV PACING THRESHOLD AMPLITUDE: 1.2 V
MDC IDC MSMT LEADCHNL RV PACING THRESHOLD PULSEWIDTH: 0.4 ms
MDC IDC MSMT LEADCHNL RV PACING THRESHOLD PULSEWIDTH: 0.4 ms
MDC IDC MSMT LEADCHNL RV SENSING INTR AMPL: 11.7 mV
MDC IDC SET LEADCHNL RA PACING AMPLITUDE: 3 V
MDC IDC STAT BRADY RA PERCENT PACED: 88 %
Pulse Gen Model: 394969
Pulse Gen Serial Number: 68798589

## 2016-04-05 NOTE — Progress Notes (Signed)
Wound check appointment. Steri-strips removed. Wound without redness or edema. Incision edges approximated, wound well healed. Normal device function. Thresholds, sensing, and impedances consistent with implant measurements. Device programmed at 3V for extra safety margin until 3 month visit. Histogram distribution appropriate for patient and level of activity. No mode switches or high ventricular rates noted. Patient educated about wound care, arm mobility, lifting restrictions. ROV in 3 months with implanting physician.

## 2016-04-07 ENCOUNTER — Telehealth: Payer: Self-pay | Admitting: Internal Medicine

## 2016-04-07 NOTE — Telephone Encounter (Signed)
Patient is aware FMLA is at front desk ready for pick up. Dr.Taylor completed this himself this did not go through  [facility] copy service.

## 2016-04-28 DIAGNOSIS — M5136 Other intervertebral disc degeneration, lumbar region: Secondary | ICD-10-CM | POA: Diagnosis not present

## 2016-04-28 DIAGNOSIS — M9905 Segmental and somatic dysfunction of pelvic region: Secondary | ICD-10-CM | POA: Diagnosis not present

## 2016-04-28 DIAGNOSIS — M9903 Segmental and somatic dysfunction of lumbar region: Secondary | ICD-10-CM | POA: Diagnosis not present

## 2016-04-28 DIAGNOSIS — M9904 Segmental and somatic dysfunction of sacral region: Secondary | ICD-10-CM | POA: Diagnosis not present

## 2016-05-28 ENCOUNTER — Telehealth: Payer: Self-pay | Admitting: Internal Medicine

## 2016-05-28 NOTE — Telephone Encounter (Signed)
Pt returning call

## 2016-05-28 NOTE — Telephone Encounter (Signed)
lmtcb X1 for pt  

## 2016-05-28 NOTE — Telephone Encounter (Signed)
Spoke with pt. He is needing a refill on Adderall. Last OV was on 02/05/16. Has pending OV on 06/16/16. Last refill was on 02/05/16 #90.  CY - please advise on refill. Thanks.

## 2016-05-31 MED ORDER — AMPHETAMINE-DEXTROAMPHETAMINE 10 MG PO TABS
10.0000 mg | ORAL_TABLET | Freq: Every day | ORAL | 0 refills | Status: DC
Start: 2016-05-31 — End: 2016-10-04

## 2016-05-31 NOTE — Telephone Encounter (Signed)
Ok to refill 

## 2016-05-31 NOTE — Telephone Encounter (Signed)
Rx was signed and placed up front for pick up  Left detailed msg informing the pt of this

## 2016-06-16 ENCOUNTER — Encounter: Payer: Self-pay | Admitting: Internal Medicine

## 2016-06-16 ENCOUNTER — Ambulatory Visit (INDEPENDENT_AMBULATORY_CARE_PROVIDER_SITE_OTHER): Payer: Medicare Other | Admitting: Internal Medicine

## 2016-06-16 VITALS — BP 126/72 | HR 71 | Ht 69.0 in | Wt 240.6 lb

## 2016-06-16 DIAGNOSIS — I495 Sick sinus syndrome: Secondary | ICD-10-CM | POA: Diagnosis not present

## 2016-06-16 NOTE — Progress Notes (Signed)
HPI [name] returns today for ongoing evaluation of chronotropic incompetence, s/p PPM insertion. He is a pleasant 75 yo man with obesity, HTN, and DM. He was found to have very marked chronotropic incompetence on 24 hour holter monitoring. He underwent PPM insertion 3 months ago. His energy level has improved but still somewhat lacking. No syncope or chest pain or sob.   No Known Allergies   Current Outpatient Prescriptions  Medication Sig Dispense Refill  . amphetamine-dextroamphetamine (ADDERALL) 10 MG tablet Take 1-3 tablets (10-30 mg total) by mouth daily. As directed 90 tablet 0  . aspirin EC 81 MG tablet Take 81 mg by mouth daily.    [name] atorvastatin (LIPITOR) 40 MG tablet Take 20 mg by mouth daily before breakfast.     . glucosamine-chondroitin 500-400 MG tablet Take 2 tablets by mouth daily.    [name] lisinopril (PRINIVIL,ZESTRIL) 40 MG tablet TAKE 1 TABLET BY MOUTH EVERY DAY BEFORE BREAKFAST 90 tablet 3  . metFORMIN (GLUCOPHAGE) 500 MG tablet Take 500-1,000 mg by mouth 2 (two) times daily with a meal. Take 2 tablets every morning and 1 tablet every evening    . Multiple Vitamin (MULTIVITAMIN) tablet Take 1 tablet by mouth daily.    [name] NITROSTAT 0.4 MG SL tablet PLACE 1 TABLET (0.4 MG TOTAL) UNDER THE TONGUE EVERY 5 (FIVE) MINUTES AS NEEDED FOR CHEST PAIN. 25 tablet 5  . Omega-3 Fatty Acids (FISH OIL) 1000 MG CAPS Take 6 capsules by mouth daily.     . ONE TOUCH ULTRA TEST test strip Apply 1 strip topically as directed.     No current facility-administered medications for this visit.      Past Medical History:  Diagnosis Date  . Arthritis    R shoulder, bone spur  . Arthritis    "hands" (03/25/2016)  . Chronic lower back pain   . Coronary heart disease    Dr Pernell Dupre  . H/O cardiovascular stress test    perhaps last one was 2009  . Heart murmur    12/29/11 echo: mild MR, no AS, trivial TR  . History of gout   . Hyperlipidemia   . Hypertension    saw Dr. Linard Millers  last earl;y- 2014, cardiac cath. last done ?2009, blocks seen didn't require any intervention at that point.  . Narcolepsy    [name] 04-11-97; Mean Latency 1.80min, SOREM 2  . OSA on CPAP    NPSG 12-13-98 AHI 22.7  . PONV (postoperative nausea and vomiting)   . Presence of permanent cardiac pacemaker   . Shortness of breath    with exertion   . Type II diabetes mellitus (HCC)     ROS:   All systems reviewed and negative except as noted in the HPI.   Past Surgical History:  Procedure Laterality Date  . CARDIAC CATHETERIZATION  2009?  . EP IMPLANTABLE DEVICE N/A 03/25/2016   Procedure: Pacemaker Implant - Dual Chamber;  Surgeon: Evans Lance, MD;  Location: [facility];  Service: Cardiovascular;  Laterality: N/A;  . FRACTURE SURGERY    . INSERT / REPLACE / REMOVE PACEMAKER  03/24/2016  . LEFT AND RIGHT HEART CATHETERIZATION WITH CORONARY ANGIOGRAM N/A 06/05/2014   Procedure: LEFT AND RIGHT HEART CATHETERIZATION WITH CORONARY ANGIOGRAM;  Surgeon: Sinclair Grooms, MD;  Location: [facility] CATH LAB;  Service: Cardiovascular;  Laterality: N/A;  . NASAL FRACTURE SURGERY    . SHOULDER ARTHROSCOPY WITH ROTATOR CUFF REPAIR AND SUBACROMIAL DECOMPRESSION  Right 08/16/2013   Procedure: SHOULDER ARTHROSCOPY WITH ROTATOR CUFF REPAIR AND SUBACROMIAL DECOMPRESSION;  Surgeon: [name], MD;  Location: [facility];  Service: Orthopedics;  Laterality: Right;  Right shoulder arthroscopy rotator cuff repair, subacromial decompression.  . TONSILLECTOMY       Family History  Problem Relation Age of Onset  . Sleep apnea Sister   . Colon cancer Sister      Social History   Social History  . Marital status: Married    Spouse name: N/A  . Number of children: N/A  . Years of education: N/A   Occupational History  . 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 [place] [employer]    Social History Main Topics  . Smoking status: Former Smoker    Packs/day: 2.50    Years: 6.00    Types: Cigarettes    Quit date:  06/05/1967  . Smokeless tobacco: Never Used  . Alcohol use Yes     Comment: "not often", not regular, on occasion   . Drug use: No  . Sexual activity: Not Currently   Other Topics Concern  . Not on file   Social History Narrative  . No narrative on file     BP 126/72   Pulse 71   Ht 5\' 9"  (1.753 m)   Wt 240 lb 9.6 oz (109.1 kg)   BMI 35.53 kg/m   Physical Exam:  Well appearing 75 yo man, NAD HEENT: Unremarkable Neck:  6 cm JVD, no thyromegally Back:  No CVA tenderness Lungs:  Clear with no wheezes, well healed PPM incision HEART:  Regular brady rhythm, no murmurs, no rubs, no clicks Abd:  soft, positive bowel sounds, no organomegally, no rebound, no guarding Ext:  2 plus pulses, no edema, no cyanosis, no clubbing Skin:  No rashes no nodules Neuro:  CN II through XII intact, motor grossly intact  Assess/Plan: 1. 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 We have adjusted his PPM to increase his chronotropic response to exercise. 2. Obesity - we discussed the importance of increasing physical activity and weight loss. 3. HTN - his blood pressure is slightly elevated. I would consider adding a beta blocker once his PPM is placed. 4. Diastolic heart failure - he is class 2 but this may well be over estimated because of chronotropic incompetence.   Mikle Bosworth.D.

## 2016-06-16 NOTE — Patient Instructions (Signed)
Medication Instructions:  Your physician recommends that you continue on your current medications as directed. Please refer to the Current Medication list given to you today.   Labwork: None ordered  Testing/Procedures: None ordered  Follow-Up: Your physician recommends that you schedule a follow-up appointment in: November 2017 with [place]  Your physician wants you to follow-up in: May 2018 with Dr.Taylor You will receive a reminder letter in the mail two months in advance. If you don't receive a letter, please call our office to schedule the follow-up appointment.    Any Other Special Instructions Will Be Listed Below (If Applicable).     If you need a refill on your cardiac medications before your next appointment, please call your pharmacy.

## 2016-06-22 DIAGNOSIS — M5136 Other intervertebral disc degeneration, lumbar region: Secondary | ICD-10-CM | POA: Diagnosis not present

## 2016-06-22 DIAGNOSIS — M9903 Segmental and somatic dysfunction of lumbar region: Secondary | ICD-10-CM | POA: Diagnosis not present

## 2016-06-22 DIAGNOSIS — M9904 Segmental and somatic dysfunction of sacral region: Secondary | ICD-10-CM | POA: Diagnosis not present

## 2016-06-22 DIAGNOSIS — M9905 Segmental and somatic dysfunction of pelvic region: Secondary | ICD-10-CM | POA: Diagnosis not present

## 2016-06-25 DIAGNOSIS — M9904 Segmental and somatic dysfunction of sacral region: Secondary | ICD-10-CM | POA: Diagnosis not present

## 2016-06-25 DIAGNOSIS — M5136 Other intervertebral disc degeneration, lumbar region: Secondary | ICD-10-CM | POA: Diagnosis not present

## 2016-06-25 DIAGNOSIS — M9903 Segmental and somatic dysfunction of lumbar region: Secondary | ICD-10-CM | POA: Diagnosis not present

## 2016-06-25 DIAGNOSIS — M9905 Segmental and somatic dysfunction of pelvic region: Secondary | ICD-10-CM | POA: Diagnosis not present

## 2016-06-28 DIAGNOSIS — Z23 Encounter for immunization: Secondary | ICD-10-CM | POA: Diagnosis not present

## 2016-06-30 DIAGNOSIS — E1165 Type 2 diabetes mellitus with hyperglycemia: Secondary | ICD-10-CM | POA: Diagnosis not present

## 2016-06-30 DIAGNOSIS — G47419 Narcolepsy without cataplexy: Secondary | ICD-10-CM | POA: Diagnosis not present

## 2016-06-30 DIAGNOSIS — I10 Essential (primary) hypertension: Secondary | ICD-10-CM | POA: Diagnosis not present

## 2016-06-30 DIAGNOSIS — Z950 Presence of cardiac pacemaker: Secondary | ICD-10-CM | POA: Diagnosis not present

## 2016-06-30 DIAGNOSIS — G4730 Sleep apnea, unspecified: Secondary | ICD-10-CM | POA: Diagnosis not present

## 2016-06-30 DIAGNOSIS — E7800 Pure hypercholesterolemia, unspecified: Secondary | ICD-10-CM | POA: Diagnosis not present

## 2016-06-30 DIAGNOSIS — I2510 Atherosclerotic heart disease of native coronary artery without angina pectoris: Secondary | ICD-10-CM | POA: Diagnosis not present

## 2016-06-30 DIAGNOSIS — Z125 Encounter for screening for malignant neoplasm of prostate: Secondary | ICD-10-CM | POA: Diagnosis not present

## 2016-07-02 DIAGNOSIS — I10 Essential (primary) hypertension: Secondary | ICD-10-CM | POA: Diagnosis not present

## 2016-07-02 DIAGNOSIS — E7800 Pure hypercholesterolemia, unspecified: Secondary | ICD-10-CM | POA: Diagnosis not present

## 2016-07-02 DIAGNOSIS — Z125 Encounter for screening for malignant neoplasm of prostate: Secondary | ICD-10-CM | POA: Diagnosis not present

## 2016-07-02 LAB — CUP PACEART INCLINIC DEVICE CHECK
Implantable Lead Implant Date: 20170525
Implantable Lead Location: 753859
Implantable Lead Location: 753860
MDC IDC LEAD IMPLANT DT: 20170525
MDC IDC LEAD SERIAL: 49470224
MDC IDC LEAD SERIAL: 49485613
MDC IDC SESS DTM: 20170901154945
Pulse Gen Model: 394969
Pulse Gen Serial Number: 68798589

## 2016-07-14 DIAGNOSIS — M9904 Segmental and somatic dysfunction of sacral region: Secondary | ICD-10-CM | POA: Diagnosis not present

## 2016-07-14 DIAGNOSIS — M9905 Segmental and somatic dysfunction of pelvic region: Secondary | ICD-10-CM | POA: Diagnosis not present

## 2016-07-14 DIAGNOSIS — M5136 Other intervertebral disc degeneration, lumbar region: Secondary | ICD-10-CM | POA: Diagnosis not present

## 2016-07-14 DIAGNOSIS — M9903 Segmental and somatic dysfunction of lumbar region: Secondary | ICD-10-CM | POA: Diagnosis not present

## 2016-08-26 DIAGNOSIS — M9903 Segmental and somatic dysfunction of lumbar region: Secondary | ICD-10-CM | POA: Diagnosis not present

## 2016-08-26 DIAGNOSIS — M9905 Segmental and somatic dysfunction of pelvic region: Secondary | ICD-10-CM | POA: Diagnosis not present

## 2016-08-26 DIAGNOSIS — M5136 Other intervertebral disc degeneration, lumbar region: Secondary | ICD-10-CM | POA: Diagnosis not present

## 2016-08-26 DIAGNOSIS — M9904 Segmental and somatic dysfunction of sacral region: Secondary | ICD-10-CM | POA: Diagnosis not present

## 2016-09-15 ENCOUNTER — Telehealth: Payer: Self-pay | Admitting: Cardiology

## 2016-09-15 ENCOUNTER — Encounter: Payer: Medicare Other | Admitting: *Deleted

## 2016-09-15 NOTE — Telephone Encounter (Signed)
LMOVM reminding pt to send remote transmission.   

## 2016-09-20 ENCOUNTER — Encounter: Payer: Self-pay | Admitting: Interventional Cardiology

## 2016-09-20 ENCOUNTER — Ambulatory Visit (INDEPENDENT_AMBULATORY_CARE_PROVIDER_SITE_OTHER): Payer: Medicare Other | Admitting: Interventional Cardiology

## 2016-09-20 ENCOUNTER — Ambulatory Visit: Payer: Medicare Other | Admitting: Interventional Cardiology

## 2016-09-20 VITALS — BP 114/70 | HR 78 | Ht 69.0 in | Wt 240.2 lb

## 2016-09-20 DIAGNOSIS — I10 Essential (primary) hypertension: Secondary | ICD-10-CM | POA: Diagnosis not present

## 2016-09-20 DIAGNOSIS — G4733 Obstructive sleep apnea (adult) (pediatric): Secondary | ICD-10-CM

## 2016-09-20 DIAGNOSIS — E7849 Other hyperlipidemia: Secondary | ICD-10-CM

## 2016-09-20 DIAGNOSIS — I209 Angina pectoris, unspecified: Secondary | ICD-10-CM

## 2016-09-20 DIAGNOSIS — I4589 Other specified conduction disorders: Secondary | ICD-10-CM

## 2016-09-20 DIAGNOSIS — E784 Other hyperlipidemia: Secondary | ICD-10-CM

## 2016-09-20 DIAGNOSIS — I25118 Atherosclerotic heart disease of native coronary artery with other forms of angina pectoris: Secondary | ICD-10-CM | POA: Diagnosis not present

## 2016-09-20 DIAGNOSIS — Z950 Presence of cardiac pacemaker: Secondary | ICD-10-CM | POA: Diagnosis not present

## 2016-09-20 NOTE — Progress Notes (Signed)
Cardiology Office Note    Date:  09/20/2016   ID:  Davin, Mcadoo Jan 18, 1941, MRN [id number]  PCP:  [name], MD  Cardiologist: Sinclair Grooms, MD   Chief Complaint  Patient presents with  . Coronary Artery Disease    History of Present Illness:  Jordan [name] is a 75 y.o. male with history of coronary artery disease, hypertension, hyperlipidemia sleep apnea, and known mitral calcification. He has a several year history of chronotropic incompetence and because of progressive fatigue have permanent pacemaker insertion in May 2017.  Since pacemaker insertion, exertional tolerance has somewhat improved. He felt that he would be doing even better than he is now. He clearly is better than he was prior to pacemaker insertion. He has not had syncope. He denies dyspnea. No orthopnea. No PND. No ankle edema.  Past Medical History:  Diagnosis Date  . Arthritis    R shoulder, bone spur  . Arthritis    "hands" (03/25/2016)  . Chronic lower back pain   . Coronary heart disease    Dr Pernell Dupre  . H/O cardiovascular stress test    perhaps last one was 2009  . Heart murmur    12/29/11 echo: mild MR, no AS, trivial TR  . History of gout   . Hyperlipidemia   . Hypertension    saw Dr. Linard Millers last earl;y- 2014, cardiac cath. last done ?2009, blocks seen didn't require any intervention at that point.  . 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 OSA on CPAP    NPSG 12-13-98 AHI 22.7  . PONV (postoperative nausea and vomiting)   . Presence of permanent cardiac pacemaker   . Shortness of breath    with exertion   . Type II diabetes mellitus ([place])     Past Surgical History:  Procedure Laterality Date  . CARDIAC CATHETERIZATION  2009?  . EP IMPLANTABLE DEVICE N/A 03/25/2016   Procedure: Pacemaker Implant - Dual Chamber;  Surgeon: Evans Lance, MD;  Location: [facility];  Service: Cardiovascular;  Laterality: N/A;  . FRACTURE SURGERY    .  INSERT / REPLACE / REMOVE PACEMAKER  03/24/2016  . LEFT AND RIGHT HEART CATHETERIZATION WITH CORONARY ANGIOGRAM N/A 06/05/2014   Procedure: LEFT AND RIGHT HEART CATHETERIZATION WITH CORONARY ANGIOGRAM;  Surgeon: Sinclair Grooms, MD;  Location: [facility] CATH LAB;  Service: Cardiovascular;  Laterality: N/A;  . NASAL FRACTURE SURGERY    . SHOULDER ARTHROSCOPY WITH ROTATOR CUFF REPAIR AND SUBACROMIAL DECOMPRESSION Right 08/16/2013   Procedure: SHOULDER ARTHROSCOPY WITH ROTATOR CUFF REPAIR AND SUBACROMIAL DECOMPRESSION;  Surgeon: [name], MD;  Location: [facility];  Service: Orthopedics;  Laterality: Right;  Right shoulder arthroscopy rotator cuff repair, subacromial decompression.  . TONSILLECTOMY      Current Medications: Outpatient Medications Prior to Visit  Medication Sig Dispense Refill  . amphetamine-dextroamphetamine (ADDERALL) 10 MG tablet Take 1-3 tablets (10-30 mg total) by mouth daily. As directed 90 tablet 0  . aspirin EC 81 MG tablet Take 81 mg by mouth daily.    [name] atorvastatin (LIPITOR) 40 MG tablet Take 20 mg by mouth daily before breakfast.     . glucosamine-chondroitin 500-400 MG tablet Take 2 tablets by mouth daily.    [name] lisinopril (PRINIVIL,ZESTRIL) 40 MG tablet TAKE 1 TABLET BY MOUTH EVERY DAY BEFORE BREAKFAST 90 tablet 3  . Multiple Vitamin (MULTIVITAMIN) tablet Take 1 tablet by mouth daily.    [name]  NITROSTAT 0.4 MG SL tablet PLACE 1 TABLET (0.4 MG TOTAL) UNDER THE TONGUE EVERY 5 (FIVE) MINUTES AS NEEDED FOR CHEST PAIN. 25 tablet 5  . ONE TOUCH ULTRA TEST test strip Apply 1 strip topically as directed.    . metFORMIN (GLUCOPHAGE) 500 MG tablet Take 500-1,000 mg by mouth 2 (two) times daily with a meal. Take 2 tablets every morning and 1 tablet every evening    . Omega-3 Fatty Acids (FISH OIL) 1000 MG CAPS Take 6 capsules by mouth daily.      No facility-administered medications prior to visit.      Allergies:   Patient has no known allergies.   Social History   Social  History  . Marital status: Married    Spouse name: N/A  . Number of children: N/A  . Years of education: N/A   Occupational History  . 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 [place] [employer]    Social History Main Topics  . Smoking status: Former Smoker    Packs/day: 2.50    Years: 6.00    Types: Cigarettes    Quit date: 06/05/1967  . Smokeless tobacco: Never Used  . Alcohol use Yes     Comment: "not often", not regular, on occasion   . Drug use: No  . Sexual activity: Not Currently   Other Topics Concern  . None   Social History Narrative  . None     Family History:  The patient's family history includes Colon cancer in his sister; Sleep apnea in his sister.   ROS:   Please see the history of present illness.    Occasional vision disturbance but otherwise unremarkable.  All other systems reviewed and are negative.   PHYSICAL EXAM:   VS:  BP 114/70   Pulse 78   Ht 5\' 9"  (1.753 m)   Wt 240 lb 3.2 oz (109 kg)   BMI 35.47 kg/m    GEN: Well nourished, well developed, in no acute distress  HEENT: normal  Neck: no JVD, carotid bruits, or masses Cardiac: RRR; no murmurs, rubs, or gallops,no edema  Respiratory:  clear to auscultation bilaterally, normal work of breathing GI: soft, nontender, nondistended, + BS MS: no deformity or atrophy  Skin: warm and dry, no rash Neuro:  Alert and Oriented x 3, Strength and sensation are intact Psych: euthymic mood, full affect  Wt Readings from Last 3 Encounters:  09/20/16 240 lb 3.2 oz (109 kg)  06/16/16 240 lb 9.6 oz (109.1 kg)  03/25/16 240 lb (108.9 kg)      Studies/Labs Reviewed:   EKG:  EKG  EKG is not performed. The last EKG performed on 06/16/16 revealed atrial pacing with ventricular tracking. 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  Recent Labs: 03/18/2016: BUN 22; Creat 1.16; Hemoglobin 14.7; Platelets 241; Potassium 4.7; Sodium 139   Lipid Panel No results found for: CHOL, TRIG, HDL, CHOLHDL, VLDL, LDLCALC,  LDLDIRECT  Additional studies/ records that were reviewed today include:  No new data    ASSESSMENT:    1. Presence of permanent cardiac pacemaker   2. Chronotropic incompetence   3. Essential hypertension   4. Atherosclerosis of native coronary artery of native heart with stable angina pectoris ([place])   5. Obstructive sleep apnea   6. Other hyperlipidemia      PLAN:  In order of problems listed above:  1. Missed the most recent telemetry telephonic follow-up. Since implantation strength is improved somewhat. Being followed now in the device  clinic. Dr. Lovena Le is his primary electrophysiologist. 2. Now treated with permanent pacemaker and atrial pacing with ventricular tracking is noted on post insertion tracings. 3. Very well controlled. 4. No anginal quality symptoms.    Medication Adjustments/Labs and Tests Ordered: Current medicines are reviewed at length with the patient today.  Concerns regarding medicines are outlined above.  Medication changes, Labs and Tests ordered today are listed in the Patient Instructions below. Patient Instructions  Medication Instructions:  None  Labwork: None  Testing/Procedures: None  Follow-Up: Your physician wants you to follow-up in: 9-12 months with Dr. Tamala Julian. You will receive a reminder letter in the mail two months in advance. If you don't receive a letter, please call our office to schedule the follow-up appointment.   Any Other Special Instructions Will Be Listed Below (If Applicable).  Call Pamala Hurry from our device team tomorrow to do your Pacer Check.  Her number is [phone].   If you need a refill on your cardiac medications before your next appointment, please call your pharmacy.      Signed, Sinclair Grooms, MD  09/20/2016 5:18 PM    [facility] [place], [place],   [address] Phone: [phone]; Fax: [phone]

## 2016-09-20 NOTE — Patient Instructions (Signed)
Medication Instructions:  None  Labwork: None  Testing/Procedures: None  Follow-Up: Your physician wants you to follow-up in: 9-12 months with Dr. Tamala Julian. You will receive a reminder letter in the mail two months in advance. If you don't receive a letter, please call our office to schedule the follow-up appointment.   Any Other Special Instructions Will Be Listed Below (If Applicable).  Call Pamala Hurry from our device team tomorrow to do your Pacer Check.  Her number is [phone].   If you need a refill on your cardiac medications before your next appointment, please call your pharmacy.

## 2016-10-04 ENCOUNTER — Telehealth: Payer: Self-pay | Admitting: Internal Medicine

## 2016-10-04 MED ORDER — AMPHETAMINE-DEXTROAMPHETAMINE 10 MG PO TABS: 10.0000 mg | ORAL_TABLET | Freq: Every day | ORAL | 0 refills | Status: DC

## 2016-10-04 NOTE — Telephone Encounter (Signed)
Left message on voicemail stating Rx is at front ready for pick up. Nothing more needed at this time.

## 2016-10-08 DIAGNOSIS — H5202 Hypermetropia, left eye: Secondary | ICD-10-CM | POA: Diagnosis not present

## 2016-10-08 DIAGNOSIS — H40053 Ocular hypertension, bilateral: Secondary | ICD-10-CM | POA: Diagnosis not present

## 2016-10-08 DIAGNOSIS — H524 Presbyopia: Secondary | ICD-10-CM | POA: Diagnosis not present

## 2016-10-08 DIAGNOSIS — H25813 Combined forms of age-related cataract, bilateral: Secondary | ICD-10-CM | POA: Diagnosis not present

## 2016-10-08 DIAGNOSIS — H52223 Regular astigmatism, bilateral: Secondary | ICD-10-CM | POA: Diagnosis not present

## 2016-10-08 DIAGNOSIS — H5211 Myopia, right eye: Secondary | ICD-10-CM | POA: Diagnosis not present

## 2016-10-08 DIAGNOSIS — E119 Type 2 diabetes mellitus without complications: Secondary | ICD-10-CM | POA: Diagnosis not present

## 2016-10-08 DIAGNOSIS — H40013 Open angle with borderline findings, low risk, bilateral: Secondary | ICD-10-CM | POA: Diagnosis not present

## 2016-10-12 DIAGNOSIS — M5136 Other intervertebral disc degeneration, lumbar region: Secondary | ICD-10-CM | POA: Diagnosis not present

## 2016-10-12 DIAGNOSIS — M9905 Segmental and somatic dysfunction of pelvic region: Secondary | ICD-10-CM | POA: Diagnosis not present

## 2016-10-12 DIAGNOSIS — M9904 Segmental and somatic dysfunction of sacral region: Secondary | ICD-10-CM | POA: Diagnosis not present

## 2016-10-12 DIAGNOSIS — M9903 Segmental and somatic dysfunction of lumbar region: Secondary | ICD-10-CM | POA: Diagnosis not present

## 2016-10-29 DIAGNOSIS — H25812 Combined forms of age-related cataract, left eye: Secondary | ICD-10-CM | POA: Diagnosis not present

## 2016-10-29 DIAGNOSIS — H25811 Combined forms of age-related cataract, right eye: Secondary | ICD-10-CM | POA: Diagnosis not present

## 2016-10-29 DIAGNOSIS — E119 Type 2 diabetes mellitus without complications: Secondary | ICD-10-CM | POA: Diagnosis not present

## 2016-11-05 ENCOUNTER — Telehealth: Payer: Self-pay | Admitting: Interventional Cardiology

## 2016-11-05 DIAGNOSIS — L989 Disorder of the skin and subcutaneous tissue, unspecified: Secondary | ICD-10-CM | POA: Diagnosis not present

## 2016-11-05 DIAGNOSIS — R1084 Generalized abdominal pain: Secondary | ICD-10-CM | POA: Diagnosis not present

## 2016-11-05 DIAGNOSIS — Z7984 Long term (current) use of oral hypoglycemic drugs: Secondary | ICD-10-CM | POA: Diagnosis not present

## 2016-11-05 DIAGNOSIS — I10 Essential (primary) hypertension: Secondary | ICD-10-CM | POA: Diagnosis not present

## 2016-11-05 DIAGNOSIS — E7800 Pure hypercholesterolemia, unspecified: Secondary | ICD-10-CM | POA: Diagnosis not present

## 2016-11-05 DIAGNOSIS — E1165 Type 2 diabetes mellitus with hyperglycemia: Secondary | ICD-10-CM | POA: Diagnosis not present

## 2016-11-05 DIAGNOSIS — H8113 Benign paroxysmal vertigo, bilateral: Secondary | ICD-10-CM | POA: Diagnosis not present

## 2016-11-05 NOTE — Telephone Encounter (Signed)
Walk In pt form-[facility]-paper dropped off given to [place] to take around.

## 2016-11-08 ENCOUNTER — Other Ambulatory Visit: Payer: Self-pay | Admitting: Family Medicine

## 2016-11-08 DIAGNOSIS — R1084 Generalized abdominal pain: Secondary | ICD-10-CM

## 2016-11-09 ENCOUNTER — Telehealth: Payer: Self-pay | Admitting: Interventional Cardiology

## 2016-11-09 NOTE — Telephone Encounter (Signed)
New Prob   Request for surgical clearance:  1. What type of surgery is being performed? Cataract surgery (L eye)  2. When is this surgery scheduled? Thursday 11/11/16  3. Are there any medications that need to be held prior to surgery and how long? Pt is unsure.  4. Name of physician performing surgery? Dr. Alanda Slim  5. What is your office phone and fax number? Phone: [phone] Fax: [phone]

## 2016-11-09 NOTE — Telephone Encounter (Signed)
Spoke with pt and made him aware that I just sent clearance over to Dr. Colan Neptune office.  Pt appreciative for assistance.

## 2016-11-11 DIAGNOSIS — H2512 Age-related nuclear cataract, left eye: Secondary | ICD-10-CM | POA: Diagnosis not present

## 2016-11-11 DIAGNOSIS — H25812 Combined forms of age-related cataract, left eye: Secondary | ICD-10-CM | POA: Diagnosis not present

## 2016-11-16 ENCOUNTER — Ambulatory Visit
Admission: RE | Admit: 2016-11-16 | Discharge: 2016-11-16 | Disposition: A | Discharge status: Home / Self Care | Payer: Medicare Other | Source: Ambulatory Visit | Attending: Family Medicine | Admitting: Family Medicine

## 2016-11-16 DIAGNOSIS — N281 Cyst of kidney, acquired: Secondary | ICD-10-CM | POA: Diagnosis not present

## 2016-11-16 DIAGNOSIS — R1084 Generalized abdominal pain: Secondary | ICD-10-CM

## 2016-12-02 DIAGNOSIS — H2511 Age-related nuclear cataract, right eye: Secondary | ICD-10-CM | POA: Diagnosis not present

## 2016-12-02 DIAGNOSIS — H25811 Combined forms of age-related cataract, right eye: Secondary | ICD-10-CM | POA: Diagnosis not present

## 2016-12-02 DIAGNOSIS — Z961 Presence of intraocular lens: Secondary | ICD-10-CM | POA: Diagnosis not present

## 2016-12-30 DIAGNOSIS — Z7984 Long term (current) use of oral hypoglycemic drugs: Secondary | ICD-10-CM | POA: Diagnosis not present

## 2016-12-30 DIAGNOSIS — I10 Essential (primary) hypertension: Secondary | ICD-10-CM | POA: Diagnosis not present

## 2016-12-30 DIAGNOSIS — G47419 Narcolepsy without cataplexy: Secondary | ICD-10-CM | POA: Diagnosis not present

## 2016-12-30 DIAGNOSIS — Z950 Presence of cardiac pacemaker: Secondary | ICD-10-CM | POA: Diagnosis not present

## 2016-12-30 DIAGNOSIS — E7800 Pure hypercholesterolemia, unspecified: Secondary | ICD-10-CM | POA: Diagnosis not present

## 2016-12-30 DIAGNOSIS — G4730 Sleep apnea, unspecified: Secondary | ICD-10-CM | POA: Diagnosis not present

## 2016-12-30 DIAGNOSIS — E1165 Type 2 diabetes mellitus with hyperglycemia: Secondary | ICD-10-CM | POA: Diagnosis not present

## 2016-12-30 DIAGNOSIS — R1084 Generalized abdominal pain: Secondary | ICD-10-CM | POA: Diagnosis not present

## 2017-01-20 ENCOUNTER — Telehealth: Payer: Self-pay | Admitting: Cardiology

## 2017-01-20 NOTE — Telephone Encounter (Signed)
Called pt b/c we received an alert that pt home monitor has not updated in the last 21 days. Pt checked his monitor and it had come unplugged. He plugged his monitor back into the electric outlet. I informed him that his monitor will read his device tonight while he sleeps. Pt verbalized understanding.

## 2017-01-31 ENCOUNTER — Telehealth: Payer: Self-pay | Admitting: Internal Medicine

## 2017-01-31 MED ORDER — AMPHETAMINE-DEXTROAMPHETAMINE 10 MG PO TABS: 10.0000 mg | ORAL_TABLET | Freq: Every day | ORAL | 0 refills | Status: DC

## 2017-01-31 NOTE — Telephone Encounter (Signed)
lmtcb X1 for pt to make aware of refill.  rx printed and placed on CY's cart for signature.

## 2017-01-31 NOTE — Telephone Encounter (Signed)
Ok to refill as requested 

## 2017-01-31 NOTE — Telephone Encounter (Signed)
Pt requesting refill on Adderall 10mg  Last refill: 10/04/2016 #90 with 0 refills, take 1-3 tabs qd as directed Last ov: 02/06/16 Next ov: none- (recall in chart to follow up 01/2017)  Pt wishes to pick up rx.  CY please advise on refill.  Thanks.

## 2017-02-01 NOTE — Telephone Encounter (Signed)
Katie- did he ever sign this and give to you?

## 2017-02-01 NOTE — Telephone Encounter (Signed)
LM x 1 to make aware that Rx is ready for pick up.

## 2017-02-01 NOTE — Telephone Encounter (Signed)
Rx is at front in pick up folder. Thanks.

## 2017-02-02 NOTE — Telephone Encounter (Signed)
Called and lmom x 2 to make the pt aware of rx that is up front and ready to be picked up.

## 2017-03-14 ENCOUNTER — Other Ambulatory Visit: Payer: Self-pay | Admitting: Family Medicine

## 2017-03-14 DIAGNOSIS — N451 Epididymitis: Secondary | ICD-10-CM | POA: Diagnosis not present

## 2017-03-22 DIAGNOSIS — M9901 Segmental and somatic dysfunction of cervical region: Secondary | ICD-10-CM | POA: Diagnosis not present

## 2017-03-22 DIAGNOSIS — M9903 Segmental and somatic dysfunction of lumbar region: Secondary | ICD-10-CM | POA: Diagnosis not present

## 2017-03-22 DIAGNOSIS — M50322 Other cervical disc degeneration at C5-C6 level: Secondary | ICD-10-CM | POA: Diagnosis not present

## 2017-03-22 DIAGNOSIS — M5136 Other intervertebral disc degeneration, lumbar region: Secondary | ICD-10-CM | POA: Diagnosis not present

## 2017-04-05 ENCOUNTER — Ambulatory Visit
Admission: RE | Admit: 2017-04-05 | Discharge: 2017-04-05 | Disposition: A | Discharge status: Home / Self Care | Payer: Medicare Other | Source: Ambulatory Visit | Attending: Family Medicine | Admitting: Family Medicine

## 2017-04-05 DIAGNOSIS — M50322 Other cervical disc degeneration at C5-C6 level: Secondary | ICD-10-CM | POA: Diagnosis not present

## 2017-04-05 DIAGNOSIS — M5136 Other intervertebral disc degeneration, lumbar region: Secondary | ICD-10-CM | POA: Diagnosis not present

## 2017-04-05 DIAGNOSIS — N451 Epididymitis: Secondary | ICD-10-CM

## 2017-04-05 DIAGNOSIS — M9903 Segmental and somatic dysfunction of lumbar region: Secondary | ICD-10-CM | POA: Diagnosis not present

## 2017-04-05 DIAGNOSIS — N433 Hydrocele, unspecified: Secondary | ICD-10-CM | POA: Diagnosis not present

## 2017-04-05 DIAGNOSIS — M9901 Segmental and somatic dysfunction of cervical region: Secondary | ICD-10-CM | POA: Diagnosis not present

## 2017-04-11 ENCOUNTER — Encounter: Payer: PRIVATE HEALTH INSURANCE | Admitting: Internal Medicine

## 2017-04-12 DIAGNOSIS — G4730 Sleep apnea, unspecified: Secondary | ICD-10-CM | POA: Diagnosis not present

## 2017-04-12 DIAGNOSIS — Z0000 Encounter for general adult medical examination without abnormal findings: Secondary | ICD-10-CM | POA: Diagnosis not present

## 2017-04-12 DIAGNOSIS — Z950 Presence of cardiac pacemaker: Secondary | ICD-10-CM | POA: Diagnosis not present

## 2017-04-12 DIAGNOSIS — I2510 Atherosclerotic heart disease of native coronary artery without angina pectoris: Secondary | ICD-10-CM | POA: Diagnosis not present

## 2017-04-12 DIAGNOSIS — G47419 Narcolepsy without cataplexy: Secondary | ICD-10-CM | POA: Diagnosis not present

## 2017-04-12 DIAGNOSIS — E1165 Type 2 diabetes mellitus with hyperglycemia: Secondary | ICD-10-CM | POA: Diagnosis not present

## 2017-04-12 DIAGNOSIS — E7800 Pure hypercholesterolemia, unspecified: Secondary | ICD-10-CM | POA: Diagnosis not present

## 2017-04-12 DIAGNOSIS — I10 Essential (primary) hypertension: Secondary | ICD-10-CM | POA: Diagnosis not present

## 2017-04-14 ENCOUNTER — Encounter: Payer: Self-pay | Admitting: Internal Medicine

## 2017-04-14 ENCOUNTER — Ambulatory Visit (INDEPENDENT_AMBULATORY_CARE_PROVIDER_SITE_OTHER): Payer: Medicare Other | Admitting: Internal Medicine

## 2017-04-14 VITALS — BP 86/60 | HR 66 | Ht 69.0 in | Wt 235.8 lb

## 2017-04-14 DIAGNOSIS — I4589 Other specified conduction disorders: Secondary | ICD-10-CM | POA: Diagnosis not present

## 2017-04-14 DIAGNOSIS — I495 Sick sinus syndrome: Secondary | ICD-10-CM | POA: Diagnosis not present

## 2017-04-14 DIAGNOSIS — Z950 Presence of cardiac pacemaker: Secondary | ICD-10-CM | POA: Diagnosis not present

## 2017-04-14 LAB — CUP PACEART INCLINIC DEVICE CHECK
Brady Statistic RA Percent Paced: 84 %
Brady Statistic RV Percent Paced: 4 %
Implantable Lead Implant Date: 20170525
Implantable Lead Model: 377
Implantable Lead Model: 377
Lead Channel Impedance Value: 546 Ohm
Lead Channel Pacing Threshold Amplitude: 0.8 V
Lead Channel Pacing Threshold Pulse Width: 0.4 ms
Lead Channel Pacing Threshold Pulse Width: 0.4 ms
Lead Channel Setting Pacing Amplitude: 2.4 V
Lead Channel Setting Pacing Amplitude: 2.4 V
MDC IDC LEAD IMPLANT DT: 20170525
MDC IDC LEAD LOCATION: 753859
MDC IDC LEAD LOCATION: 753860
MDC IDC LEAD SERIAL: 49470224
MDC IDC LEAD SERIAL: 49485613
MDC IDC MSMT LEADCHNL RA PACING THRESHOLD AMPLITUDE: 1.2 V
MDC IDC MSMT LEADCHNL RA SENSING INTR AMPL: 8.9 mV
MDC IDC MSMT LEADCHNL RV IMPEDANCE VALUE: 546 Ohm
MDC IDC MSMT LEADCHNL RV SENSING INTR AMPL: 11.4 mV
MDC IDC PG IMPLANT DT: 20170525
MDC IDC SESS DTM: 20180614113219
MDC IDC SET LEADCHNL RV PACING PULSEWIDTH: 0.4 ms
Pulse Gen Serial Number: 68798589

## 2017-04-14 NOTE — Progress Notes (Signed)
HPI [name] returns today for ongoing evaluation of chronotropic incompetence, s/p PPM insertion. He is a pleasant 76 yo man with obesity, HTN, and DM. He was found to have very marked chronotropic incompetence on 24 hour holter monitoring. He underwent PPM insertion 13 months ago. His energy level has improved but still somewhat lacking. No syncope or chest pain or sob. He notes occaisional orthostatic symptoms. He states that his pressure was a little high in his primary MD's office.   No Known Allergies   Current Outpatient Prescriptions  Medication Sig Dispense Refill  . amphetamine-dextroamphetamine (ADDERALL) 10 MG tablet Take 1-3 tablets (10-30 mg total) by mouth daily. As directed 90 tablet 0  . atorvastatin (LIPITOR) 40 MG tablet Take 20 mg by mouth daily before breakfast.     . glucosamine-chondroitin 500-400 MG tablet Take 2 tablets by mouth daily.    [name] lisinopril (PRINIVIL,ZESTRIL) 40 MG tablet TAKE 1 TABLET BY MOUTH EVERY DAY BEFORE BREAKFAST 90 tablet 3  . metFORMIN (GLUCOPHAGE-XR) 500 MG 24 hr tablet Take 1,000 mg by mouth 2 (two) times daily with a meal.    . Multiple Vitamin (MULTIVITAMIN) tablet Take 1 tablet by mouth daily.    [name] NITROSTAT 0.4 MG SL tablet PLACE 1 TABLET (0.4 MG TOTAL) UNDER THE TONGUE EVERY 5 (FIVE) MINUTES AS NEEDED FOR CHEST PAIN. 25 tablet 5  . Omega 3 1200 MG CAPS Take 1,200 mg by mouth daily.    . ONE TOUCH ULTRA TEST test strip Apply 1 strip topically as directed.     No current facility-administered medications for this visit.      Past Medical History:  Diagnosis Date  . Arthritis    R shoulder, bone spur  . Arthritis    "hands" (03/25/2016)  . Chronic lower back pain   . Coronary heart disease    Dr Pernell Dupre  . H/O cardiovascular stress test    perhaps last one was 2009  . Heart murmur    12/29/11 echo: mild MR, no AS, trivial TR  . History of gout   . Hyperlipidemia   . Hypertension    saw Dr. Linard Millers last earl;y-  2014, cardiac cath. last done ?2009, blocks seen didn't require any intervention at that point.  . 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 OSA on CPAP    NPSG 12-13-98 AHI 22.7  . PONV (postoperative nausea and vomiting)   . Presence of permanent cardiac pacemaker   . Shortness of breath    with exertion   . Type II diabetes mellitus (HCC)     ROS:   All systems reviewed and negative except as noted in the HPI.   Past Surgical History:  Procedure Laterality Date  . CARDIAC CATHETERIZATION  2009?  . EP IMPLANTABLE DEVICE N/A 03/25/2016   Procedure: Pacemaker Implant - Dual Chamber;  Surgeon: Evans Lance, MD;  Location: [facility];  Service: Cardiovascular;  Laterality: N/A;  . FRACTURE SURGERY    . INSERT / REPLACE / REMOVE PACEMAKER  03/24/2016  . LEFT AND RIGHT HEART CATHETERIZATION WITH CORONARY ANGIOGRAM N/A 06/05/2014   Procedure: LEFT AND RIGHT HEART CATHETERIZATION WITH CORONARY ANGIOGRAM;  Surgeon: Sinclair Grooms, MD;  Location: [facility];  Service: Cardiovascular;  Laterality: N/A;  . NASAL FRACTURE SURGERY    . SHOULDER ARTHROSCOPY WITH ROTATOR CUFF REPAIR AND SUBACROMIAL DECOMPRESSION Right 08/16/2013   Procedure: SHOULDER ARTHROSCOPY WITH  ROTATOR CUFF REPAIR AND SUBACROMIAL DECOMPRESSION;  Surgeon: [name], MD;  Location: [facility];  Service: Orthopedics;  Laterality: Right;  Right shoulder arthroscopy rotator cuff repair, subacromial decompression.  . TONSILLECTOMY       Family History  Problem Relation Age of Onset  . Sleep apnea Sister   . Colon cancer Sister      Social History   Social History  . Marital status: Married    Spouse name: N/A  . Number of children: N/A  . Years of education: N/A   Occupational History  . 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 [place] [employer]    Social History Main Topics  . Smoking status: Former Smoker    Packs/day: 2.50    Years: 6.00    Types: Cigarettes    Quit date: 06/05/1967  .  Smokeless tobacco: Never Used  . Alcohol use Yes     Comment: "not often", not regular, on occasion   . Drug use: No  . Sexual activity: Not Currently   Other Topics Concern  . Not on file   Social History Narrative  . No narrative on file     BP (!) 86/60   Pulse 66   Ht 5\' 9"  (1.753 m)   Wt 235 lb 12.8 oz (107 kg)   SpO2 98%   BMI 34.82 kg/m   Physical Exam:  Well appearing 76 yo man, NAD HEENT: Unremarkable Neck:  6 cm JVD, no thyromegally Back:  No CVA tenderness Lungs:  Clear with no wheezes, well healed PPM incision HEART:  Regular brady rhythm, no murmurs, no rubs, no clicks Abd:  soft, positive bowel sounds, no organomegally, no rebound, no guarding Ext:  2 plus pulses, no edema, no cyanosis, no clubbing Skin:  No rashes no nodules Neuro:  CN II through XII intact, motor grossly intact  Assess/Plan: 1. 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 We have adjusted his PPM to increase his chronotropic response to exercise. 2. Obesity - we discussed the importance of increasing physical activity and weight loss. 3. HTN - his blood pressure is low today. I considered reducing his dose of lisinopril but because of his prior high blood pressure we will hold off on this. If his pressures remain low or he remains with orthostatic symptoms, he will be instructed to reduce his dose of lisinopril. 4. Diastolic heart failure - he is class 2 but this may well be over estimated because of chronotropic incompetence.  5. Orthostasis - I have asked him to flex his feet before he stands up.  Mikle Bosworth.D.

## 2017-04-14 NOTE — Patient Instructions (Signed)
Medication Instructions:  Your physician recommends that you continue on your current medications as directed. Please refer to the Current Medication list given to you today.   Labwork: None Ordered   Testing/Procedures: None Ordered   Follow-Up: Your physician wants you to follow-up in: 1 year with Dr. Lovena Le. You will receive a reminder letter in the mail two months in advance. If you don't receive a letter, please call our office to schedule the follow-up appointment.  Remote monitoring is used to monitor your Pacemaker from home. This monitoring reduces the number of office visits required to check your device to one time per year. It allows [place] to keep an eye on the functioning of your device to ensure it is working properly. You are scheduled for a device check from home on  07/19/17 . You may send your transmission at any time that day. If you have a wireless device, the transmission will be sent automatically. After your physician reviews your transmission, you will receive a postcard with your next transmission date.    Any Other Special Instructions Will Be Listed Below (If Applicable).     If you need a refill on your cardiac medications before your next appointment, please call your pharmacy.

## 2017-04-15 ENCOUNTER — Telehealth: Payer: Self-pay | Admitting: Internal Medicine

## 2017-04-15 NOTE — Telephone Encounter (Signed)
Patient calling, states that he was instructed to let Dr. Lovena Le know if he wanted to reduce the dosage for his Lisinopril medication.Patient states that he reduced dosage from 40 mg to 20 mg and he just wanted to make Dr. Lovena Le aware. Thanks.

## 2017-04-15 NOTE — Telephone Encounter (Signed)
Patient states at [place] with Dr. Lovena Le they have a conversation about reducing his lisinopril from 40mg  to 20mg .   States Dr. Lovena Le verbalized this was okay but to let him know if he decided to [facility] this change.  Patient reports he is reducing to 20mg  daily and was calling to inform.    Med list updated and will route to Dr. Lovena Le as Juluis Rainier.  Patient aware.

## 2017-04-22 NOTE — Telephone Encounter (Signed)
20 mg daily is fine. GT

## 2017-04-26 ENCOUNTER — Telehealth: Payer: Self-pay

## 2017-05-06 NOTE — Telephone Encounter (Signed)
error 

## 2017-05-18 ENCOUNTER — Telehealth: Payer: Self-pay

## 2017-05-18 MED ORDER — LISINOPRIL 20 MG PO TABS: 20.0000 mg | ORAL_TABLET | Freq: Every day | ORAL | 3 refills | Status: DC

## 2017-05-18 NOTE — Telephone Encounter (Signed)
Per previous phone note, patient is no longer taking 40 mg daily of lisinopril. Patient is now taking 20 mg daily with the ok from Dr. Lovena Le to do so.    HC

## 2017-06-07 DIAGNOSIS — M9903 Segmental and somatic dysfunction of lumbar region: Secondary | ICD-10-CM | POA: Diagnosis not present

## 2017-06-07 DIAGNOSIS — M9905 Segmental and somatic dysfunction of pelvic region: Secondary | ICD-10-CM | POA: Diagnosis not present

## 2017-06-07 DIAGNOSIS — M9904 Segmental and somatic dysfunction of sacral region: Secondary | ICD-10-CM | POA: Diagnosis not present

## 2017-06-07 DIAGNOSIS — M5136 Other intervertebral disc degeneration, lumbar region: Secondary | ICD-10-CM | POA: Diagnosis not present

## 2017-06-20 ENCOUNTER — Telehealth: Payer: Self-pay | Admitting: Internal Medicine

## 2017-06-20 NOTE — Telephone Encounter (Signed)
Pt is requesting a refill on Adderall. Last OV with CY was on 02/05/16. Pt does not have any pending OVs. Last refill was on 01/31/17 #90.  CY - please advise on refill. Thanks.

## 2017-06-20 NOTE — Telephone Encounter (Signed)
lmtcb x1 for pt. We need to find out if the pt wants this mailed or if he is going to pick it up. ROV will also need to be scheduled.

## 2017-06-20 NOTE — Telephone Encounter (Signed)
Ok to refill  Please get him routine ROV next available- next 2-3 months ok.

## 2017-06-21 NOTE — Telephone Encounter (Signed)
Attempted to contact pt. There was no answer and I could not leave a message due to his mailbox being full. Will try back.

## 2017-06-21 NOTE — Telephone Encounter (Signed)
lmomtcb x 1 for the pt 

## 2017-06-21 NOTE — Telephone Encounter (Signed)
Patient is returning phone call.  °

## 2017-06-22 MED ORDER — AMPHETAMINE-DEXTROAMPHETAMINE 10 MG PO TABS: 10.0000 mg | ORAL_TABLET | Freq: Every day | ORAL | 0 refills | Status: DC

## 2017-06-22 NOTE — Telephone Encounter (Signed)
Called and spoke with pt and he stated that he is out of town and will come by on Friday and pick up the script and schedule his appt with CY.  Nothing further is needed.

## 2017-07-06 DIAGNOSIS — M9903 Segmental and somatic dysfunction of lumbar region: Secondary | ICD-10-CM | POA: Diagnosis not present

## 2017-07-06 DIAGNOSIS — M5136 Other intervertebral disc degeneration, lumbar region: Secondary | ICD-10-CM | POA: Diagnosis not present

## 2017-07-06 DIAGNOSIS — M9905 Segmental and somatic dysfunction of pelvic region: Secondary | ICD-10-CM | POA: Diagnosis not present

## 2017-07-06 DIAGNOSIS — M9904 Segmental and somatic dysfunction of sacral region: Secondary | ICD-10-CM | POA: Diagnosis not present

## 2017-07-07 DIAGNOSIS — K635 Polyp of colon: Secondary | ICD-10-CM | POA: Diagnosis not present

## 2017-07-07 DIAGNOSIS — K621 Rectal polyp: Secondary | ICD-10-CM | POA: Diagnosis not present

## 2017-07-07 DIAGNOSIS — Z86010 Personal history of colonic polyps: Secondary | ICD-10-CM | POA: Diagnosis not present

## 2017-07-11 DIAGNOSIS — K635 Polyp of colon: Secondary | ICD-10-CM | POA: Diagnosis not present

## 2017-07-14 ENCOUNTER — Ambulatory Visit (INDEPENDENT_AMBULATORY_CARE_PROVIDER_SITE_OTHER): Payer: Medicare Other | Admitting: *Deleted

## 2017-07-14 DIAGNOSIS — I495 Sick sinus syndrome: Secondary | ICD-10-CM

## 2017-07-14 NOTE — Progress Notes (Signed)
Remote pacemaker transmission.   

## 2017-07-15 ENCOUNTER — Encounter: Payer: Self-pay | Admitting: Cardiology

## 2017-07-22 LAB — CUP PACEART REMOTE DEVICE CHECK
Brady Statistic AP VS Percent: 84 %
Brady Statistic AS VS Percent: 8 %
Brady Statistic RV Percent Paced: 8 %
Date Time Interrogation Session: 20180921050117
Implantable Lead Implant Date: 20170525
Implantable Lead Implant Date: 20170525
Implantable Lead Model: 377
Implantable Lead Serial Number: 49470224
Lead Channel Impedance Value: 489 Ohm
Lead Channel Impedance Value: 501 Ohm
Lead Channel Pacing Threshold Amplitude: 1 V
Lead Channel Pacing Threshold Pulse Width: 0.4 ms
Lead Channel Pacing Threshold Pulse Width: 0.4 ms
Lead Channel Setting Pacing Amplitude: 2.4 V
Lead Channel Setting Pacing Amplitude: 2.4 V
MDC IDC LEAD LOCATION: 753859
MDC IDC LEAD LOCATION: 753860
MDC IDC LEAD SERIAL: 49485613
MDC IDC MSMT LEADCHNL RA PACING THRESHOLD AMPLITUDE: 1.1 V
MDC IDC PG IMPLANT DT: 20170525
MDC IDC SET LEADCHNL RV PACING PULSEWIDTH: 0.4 ms
MDC IDC STAT BRADY AP VP PERCENT: 7 %
MDC IDC STAT BRADY AS VP PERCENT: 1 %
MDC IDC STAT BRADY RA PERCENT PACED: 91 %
Pulse Gen Model: 394969
Pulse Gen Serial Number: 68798589

## 2017-08-19 DIAGNOSIS — Z23 Encounter for immunization: Secondary | ICD-10-CM | POA: Diagnosis not present

## 2017-08-19 DIAGNOSIS — Z7984 Long term (current) use of oral hypoglycemic drugs: Secondary | ICD-10-CM | POA: Diagnosis not present

## 2017-08-19 DIAGNOSIS — G4730 Sleep apnea, unspecified: Secondary | ICD-10-CM | POA: Diagnosis not present

## 2017-08-19 DIAGNOSIS — Z950 Presence of cardiac pacemaker: Secondary | ICD-10-CM | POA: Diagnosis not present

## 2017-08-19 DIAGNOSIS — Z125 Encounter for screening for malignant neoplasm of prostate: Secondary | ICD-10-CM | POA: Diagnosis not present

## 2017-08-19 DIAGNOSIS — E119 Type 2 diabetes mellitus without complications: Secondary | ICD-10-CM | POA: Diagnosis not present

## 2017-08-19 DIAGNOSIS — H539 Unspecified visual disturbance: Secondary | ICD-10-CM | POA: Diagnosis not present

## 2017-08-19 DIAGNOSIS — E7800 Pure hypercholesterolemia, unspecified: Secondary | ICD-10-CM | POA: Diagnosis not present

## 2017-08-19 DIAGNOSIS — G47419 Narcolepsy without cataplexy: Secondary | ICD-10-CM | POA: Diagnosis not present

## 2017-08-19 DIAGNOSIS — I10 Essential (primary) hypertension: Secondary | ICD-10-CM | POA: Diagnosis not present

## 2017-08-23 DIAGNOSIS — M9905 Segmental and somatic dysfunction of pelvic region: Secondary | ICD-10-CM | POA: Diagnosis not present

## 2017-08-23 DIAGNOSIS — M9903 Segmental and somatic dysfunction of lumbar region: Secondary | ICD-10-CM | POA: Diagnosis not present

## 2017-08-23 DIAGNOSIS — M9904 Segmental and somatic dysfunction of sacral region: Secondary | ICD-10-CM | POA: Diagnosis not present

## 2017-08-23 DIAGNOSIS — H26493 Other secondary cataract, bilateral: Secondary | ICD-10-CM | POA: Diagnosis not present

## 2017-08-23 DIAGNOSIS — H02834 Dermatochalasis of left upper eyelid: Secondary | ICD-10-CM | POA: Diagnosis not present

## 2017-08-23 DIAGNOSIS — Z961 Presence of intraocular lens: Secondary | ICD-10-CM | POA: Diagnosis not present

## 2017-08-23 DIAGNOSIS — M5136 Other intervertebral disc degeneration, lumbar region: Secondary | ICD-10-CM | POA: Diagnosis not present

## 2017-08-23 DIAGNOSIS — H02831 Dermatochalasis of right upper eyelid: Secondary | ICD-10-CM | POA: Diagnosis not present

## 2017-09-12 DIAGNOSIS — H60502 Unspecified acute noninfective otitis externa, left ear: Secondary | ICD-10-CM | POA: Diagnosis not present

## 2017-10-13 ENCOUNTER — Ambulatory Visit: Payer: Medicare Other | Admitting: Internal Medicine

## 2017-10-13 ENCOUNTER — Ambulatory Visit (INDEPENDENT_AMBULATORY_CARE_PROVIDER_SITE_OTHER): Payer: Medicare Other | Admitting: *Deleted

## 2017-10-13 DIAGNOSIS — I495 Sick sinus syndrome: Secondary | ICD-10-CM

## 2017-10-13 NOTE — Progress Notes (Signed)
Remote pacemaker transmission.   

## 2017-10-14 DIAGNOSIS — Z961 Presence of intraocular lens: Secondary | ICD-10-CM | POA: Diagnosis not present

## 2017-10-14 DIAGNOSIS — H26493 Other secondary cataract, bilateral: Secondary | ICD-10-CM | POA: Diagnosis not present

## 2017-10-14 DIAGNOSIS — H02834 Dermatochalasis of left upper eyelid: Secondary | ICD-10-CM | POA: Diagnosis not present

## 2017-10-14 DIAGNOSIS — H02831 Dermatochalasis of right upper eyelid: Secondary | ICD-10-CM | POA: Diagnosis not present

## 2017-10-17 ENCOUNTER — Ambulatory Visit: Payer: Medicare Other | Admitting: Internal Medicine

## 2017-10-18 ENCOUNTER — Encounter: Payer: Self-pay | Admitting: Cardiology

## 2017-10-19 DIAGNOSIS — M9905 Segmental and somatic dysfunction of pelvic region: Secondary | ICD-10-CM | POA: Diagnosis not present

## 2017-10-19 DIAGNOSIS — M9903 Segmental and somatic dysfunction of lumbar region: Secondary | ICD-10-CM | POA: Diagnosis not present

## 2017-10-19 DIAGNOSIS — M5136 Other intervertebral disc degeneration, lumbar region: Secondary | ICD-10-CM | POA: Diagnosis not present

## 2017-10-19 DIAGNOSIS — M9904 Segmental and somatic dysfunction of sacral region: Secondary | ICD-10-CM | POA: Diagnosis not present

## 2017-10-26 LAB — CUP PACEART REMOTE DEVICE CHECK
Brady Statistic AP VP Percent: 3 %
Brady Statistic AS VS Percent: 5 %
Brady Statistic RA Percent Paced: 94 %
Brady Statistic RV Percent Paced: 3 %
Date Time Interrogation Session: 20181223044855
Implantable Lead Implant Date: 20170525
Implantable Lead Location: 753860
Implantable Lead Model: 377
Implantable Lead Serial Number: 49470224
Implantable Lead Serial Number: 49485613
Implantable Pulse Generator Implant Date: 20170525
Lead Channel Impedance Value: 491 Ohm
Lead Channel Pacing Threshold Amplitude: 1 V
Lead Channel Pacing Threshold Pulse Width: 0.4 ms
Lead Channel Setting Pacing Amplitude: 2.4 V
MDC IDC LEAD IMPLANT DT: 20170525
MDC IDC LEAD LOCATION: 753859
MDC IDC MSMT LEADCHNL RA PACING THRESHOLD AMPLITUDE: 1 V
MDC IDC MSMT LEADCHNL RA PACING THRESHOLD PULSEWIDTH: 0.4 ms
MDC IDC MSMT LEADCHNL RV IMPEDANCE VALUE: 485 Ohm
MDC IDC PG SERIAL: 68798589
MDC IDC SET LEADCHNL RA PACING AMPLITUDE: 2.4 V
MDC IDC SET LEADCHNL RV PACING PULSEWIDTH: 0.4 ms
MDC IDC STAT BRADY AP VS PERCENT: 92 %
MDC IDC STAT BRADY AS VP PERCENT: 0 %

## 2017-12-19 DIAGNOSIS — M50322 Other cervical disc degeneration at C5-C6 level: Secondary | ICD-10-CM | POA: Diagnosis not present

## 2017-12-19 DIAGNOSIS — M9901 Segmental and somatic dysfunction of cervical region: Secondary | ICD-10-CM | POA: Diagnosis not present

## 2017-12-19 DIAGNOSIS — M5136 Other intervertebral disc degeneration, lumbar region: Secondary | ICD-10-CM | POA: Diagnosis not present

## 2017-12-19 DIAGNOSIS — M9903 Segmental and somatic dysfunction of lumbar region: Secondary | ICD-10-CM | POA: Diagnosis not present

## 2018-01-12 ENCOUNTER — Ambulatory Visit (INDEPENDENT_AMBULATORY_CARE_PROVIDER_SITE_OTHER): Payer: Medicare HMO | Admitting: *Deleted

## 2018-01-12 DIAGNOSIS — I495 Sick sinus syndrome: Secondary | ICD-10-CM

## 2018-01-12 NOTE — Progress Notes (Signed)
Remote pacemaker transmission.   

## 2018-01-13 ENCOUNTER — Encounter: Payer: Self-pay | Admitting: Cardiology

## 2018-01-20 DIAGNOSIS — H5210 Myopia, unspecified eye: Secondary | ICD-10-CM | POA: Diagnosis not present

## 2018-01-24 LAB — CUP PACEART REMOTE DEVICE CHECK
Battery Remaining Percentage: 85 %
Brady Statistic AS VS Percent: 5 %
Brady Statistic RV Percent Paced: 6 %
Implantable Lead Implant Date: 20170525
Implantable Lead Location: 753859
Implantable Lead Model: 377
Implantable Lead Model: 377
Implantable Pulse Generator Implant Date: 20170525
Lead Channel Impedance Value: 479 Ohm
Lead Channel Impedance Value: 480 Ohm
Lead Channel Pacing Threshold Pulse Width: 0.4 ms
Lead Channel Setting Pacing Amplitude: 2.4 V
Lead Channel Setting Pacing Amplitude: 2.4 V
Lead Channel Setting Pacing Pulse Width: 0.4 ms
MDC IDC LEAD IMPLANT DT: 20170525
MDC IDC LEAD LOCATION: 753860
MDC IDC LEAD SERIAL: 49470224
MDC IDC LEAD SERIAL: 49485613
MDC IDC MSMT LEADCHNL RA PACING THRESHOLD AMPLITUDE: 0.9 V
MDC IDC MSMT LEADCHNL RA PACING THRESHOLD PULSEWIDTH: 0.4 ms
MDC IDC MSMT LEADCHNL RV PACING THRESHOLD AMPLITUDE: 1 V
MDC IDC PG SERIAL: 68798589
MDC IDC SESS DTM: 20190326043709
MDC IDC STAT BRADY AP VP PERCENT: 4 %
MDC IDC STAT BRADY AP VS PERCENT: 89 %
MDC IDC STAT BRADY AS VP PERCENT: 2 %
MDC IDC STAT BRADY RA PERCENT PACED: 94 %

## 2018-02-01 ENCOUNTER — Ambulatory Visit: Payer: Medicare HMO | Admitting: Internal Medicine

## 2018-02-01 ENCOUNTER — Encounter: Payer: Self-pay | Admitting: Internal Medicine

## 2018-02-01 VITALS — BP 126/74 | HR 71 | Ht 67.0 in | Wt 238.8 lb

## 2018-02-01 DIAGNOSIS — G47419 Narcolepsy without cataplexy: Secondary | ICD-10-CM

## 2018-02-01 DIAGNOSIS — G4700 Insomnia, unspecified: Secondary | ICD-10-CM | POA: Insufficient documentation

## 2018-02-01 DIAGNOSIS — G4733 Obstructive sleep apnea (adult) (pediatric): Secondary | ICD-10-CM | POA: Diagnosis not present

## 2018-02-01 DIAGNOSIS — F5101 Primary insomnia: Secondary | ICD-10-CM

## 2018-02-01 MED ORDER — AMPHETAMINE-DEXTROAMPHETAMINE 10 MG PO TABS: 10.0000 mg | ORAL_TABLET | Freq: Every day | ORAL | 0 refills | Status: DC

## 2018-02-01 MED ORDER — ZALEPLON 10 MG PO CAPS
ORAL_CAPSULE | ORAL | 5 refills | Status: DC
Start: 2018-02-01 — End: 2018-02-18

## 2018-02-01 NOTE — Progress Notes (Signed)
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 VN Vet NPSG 12/13/98- AHI 22.7/hr Office Spirometry 01/31/14-  Within normal limits-FVC 3.09/77%, FEV1 2.45/80% , FEV1/FVC 0.79, FEF 25-75% 2.45/87%.  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 VN Vet CPAP 9/ [employer]               Download 90% compliance AHI 1.8/hour FOLLOWS FOR: DME: [employer]. Pt states he wears CPAP every night; No DL or in [place]. Will need to order DL  He had lost an Adderall prescription before filling it and went month without it, confirming for him that it is important to his quality of life. He thought sleepiness and lack of focus despite frequent naps. Otherwise stable-aware of some dyspnea on stairs or treadmill but rides exercise bike school at gym routinely without change. Denies chest pain, palpitation cough, wheeze.  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 VN Vet CPAP 9/ [employer]  Follows For: OSA, wears every night but has not been sleeping well at all,  Adderall 10 mg,  Some family stress stress.  Difficult and often impossible to get back to sleep after waking around 2 or 3 AM for bathroom. Still uses one, rarely to adderalls daily, and naps Very comfortable with his CPAP will not sleep without it, depends on it.  Just got So Clean machine. Has noted some changes in stamina, balance and short-term memory-I asked him to discuss with his primary physician.  ROS-see HPI    + = positive Constitutional:   No-   weight loss, night sweats, fevers, chills, fatigue, lassitude. HEENT:   No-  headaches, difficulty swallowing, tooth/dental problems, sore throat,       No-  sneezing, itching, ear ache, nasal congestion, post nasal drip,  CV:  No-   chest  pain, orthopnea, PND, swelling in lower extremities, anasarca, dizziness, palpitations Resp: + shortness of breath with exertion or at rest.              No-   productive cough,  No non-productive cough,  No- coughing up of blood.              No-   change in color of mucus.  No- wheezing.   Skin: No-   rash or lesions. GI:  No-   heartburn, indigestion, abdominal pain, nausea, vomiting,  GU:  MS:  . Neuro-     ?'s stamina, memory, balance Psych:  No- change in mood or affect. No depression or anxiety.  No memory loss.  OBJ- Physical Exam General- Alert, Oriented, Affect-appropriate, Distress- none acute.+ overweight Skin- rash-none, lesions- none, excoriation- none Lymphadenopathy- none Head- atraumatic            Eyes- Gross vision intact, PERRLA, conjunctivae and secretions clear            Ears- Hearing, canals-normal            Nose- Clear, no-Septal dev, mucus, polyps, erosion, perforation             Throat- Mallampati III , mucosa clear , drainage- none, tonsils- atrophic Neck- flexible , trachea midline, no stridor , thyroid nl, carotid no bruit Chest - symmetrical excursion , unlabored           Heart/CV- RRR , no murmur , no gallop  , no rub, nl s1 s2                           -  JVD- none , edema- none, stasis changes- none, varices- none           Lung- clear to P&A, wheeze- none, cough- none , dullness-none, rub- none           Chest wall-  + pacemaker Abd- Br/ Gen/ Rectal- Not done, not indicated Extrem- cyanosis- none, clubbing, none, atrophy- none, strength- nl Neuro- grossly intact to observation, no tremor

## 2018-02-01 NOTE — Assessment & Plan Note (Signed)
He is comfortable with his pressure.  No changes needed currently but he may be close to eligible for replacement machine which would be opportunity to change to [employer] 5-15

## 2018-02-01 NOTE — Patient Instructions (Signed)
We can continue CPAP 9, mask of choice, humidifier, supplies, AirView  Script refilling adderall  Script printed to try sonata at night if needed for sleep  Please call if we can help

## 2018-02-01 NOTE — Assessment & Plan Note (Signed)
He does very well with Adderall 10 mg once daily, rarely needing to repeat. Plan-refill Adderall with discussion

## 2018-02-01 NOTE — Assessment & Plan Note (Signed)
Review sleep hygiene Plan-try Sonata 10 mg if he has 3 or 4 hours more sleep to go in the middle of the night.

## 2018-02-03 ENCOUNTER — Telehealth: Payer: Self-pay | Admitting: Internal Medicine

## 2018-02-03 NOTE — Telephone Encounter (Signed)
I have put this note in the order fyi for the doc

## 2018-02-13 DIAGNOSIS — M5136 Other intervertebral disc degeneration, lumbar region: Secondary | ICD-10-CM | POA: Diagnosis not present

## 2018-02-13 DIAGNOSIS — M9903 Segmental and somatic dysfunction of lumbar region: Secondary | ICD-10-CM | POA: Diagnosis not present

## 2018-02-13 DIAGNOSIS — M9904 Segmental and somatic dysfunction of sacral region: Secondary | ICD-10-CM | POA: Diagnosis not present

## 2018-02-13 DIAGNOSIS — M545 Low back pain: Secondary | ICD-10-CM | POA: Diagnosis not present

## 2018-02-16 ENCOUNTER — Encounter (HOSPITAL_COMMUNITY): Payer: Self-pay | Admitting: Emergency Medicine

## 2018-02-16 ENCOUNTER — Observation Stay (HOSPITAL_COMMUNITY)
Admission: EM | Admit: 2018-02-16 | Discharge: 2018-02-18 | Disposition: A | Discharge status: Home / Self Care | Payer: Medicare HMO | Attending: Internal Medicine | Admitting: Internal Medicine

## 2018-02-16 ENCOUNTER — Other Ambulatory Visit: Payer: Self-pay

## 2018-02-16 ENCOUNTER — Emergency Department (HOSPITAL_COMMUNITY): Payer: Medicare HMO

## 2018-02-16 DIAGNOSIS — Z7984 Long term (current) use of oral hypoglycemic drugs: Secondary | ICD-10-CM | POA: Insufficient documentation

## 2018-02-16 DIAGNOSIS — I6340 Cerebral infarction due to embolism of unspecified cerebral artery: Secondary | ICD-10-CM

## 2018-02-16 DIAGNOSIS — R2681 Unsteadiness on feet: Secondary | ICD-10-CM | POA: Diagnosis not present

## 2018-02-16 DIAGNOSIS — Z6837 Body mass index (BMI) 37.0-37.9, adult: Secondary | ICD-10-CM | POA: Insufficient documentation

## 2018-02-16 DIAGNOSIS — E785 Hyperlipidemia, unspecified: Secondary | ICD-10-CM

## 2018-02-16 DIAGNOSIS — Z87891 Personal history of nicotine dependence: Secondary | ICD-10-CM | POA: Diagnosis not present

## 2018-02-16 DIAGNOSIS — I700 Atherosclerosis of aorta: Secondary | ICD-10-CM | POA: Insufficient documentation

## 2018-02-16 DIAGNOSIS — G47419 Narcolepsy without cataplexy: Secondary | ICD-10-CM | POA: Diagnosis not present

## 2018-02-16 DIAGNOSIS — I2510 Atherosclerotic heart disease of native coronary artery without angina pectoris: Secondary | ICD-10-CM | POA: Insufficient documentation

## 2018-02-16 DIAGNOSIS — G4733 Obstructive sleep apnea (adult) (pediatric): Secondary | ICD-10-CM | POA: Diagnosis present

## 2018-02-16 DIAGNOSIS — E1151 Type 2 diabetes mellitus with diabetic peripheral angiopathy without gangrene: Secondary | ICD-10-CM

## 2018-02-16 DIAGNOSIS — R200 Anesthesia of skin: Secondary | ICD-10-CM | POA: Diagnosis not present

## 2018-02-16 DIAGNOSIS — J8410 Pulmonary fibrosis, unspecified: Secondary | ICD-10-CM | POA: Diagnosis not present

## 2018-02-16 DIAGNOSIS — I10 Essential (primary) hypertension: Secondary | ICD-10-CM | POA: Diagnosis present

## 2018-02-16 DIAGNOSIS — I4510 Unspecified right bundle-branch block: Secondary | ICD-10-CM | POA: Diagnosis not present

## 2018-02-16 DIAGNOSIS — Z950 Presence of cardiac pacemaker: Secondary | ICD-10-CM | POA: Diagnosis present

## 2018-02-16 DIAGNOSIS — Z79899 Other long term (current) drug therapy: Secondary | ICD-10-CM | POA: Diagnosis not present

## 2018-02-16 DIAGNOSIS — E1169 Type 2 diabetes mellitus with other specified complication: Secondary | ICD-10-CM

## 2018-02-16 DIAGNOSIS — E669 Obesity, unspecified: Secondary | ICD-10-CM | POA: Insufficient documentation

## 2018-02-16 DIAGNOSIS — E119 Type 2 diabetes mellitus without complications: Secondary | ICD-10-CM

## 2018-02-16 DIAGNOSIS — Z8673 Personal history of transient ischemic attack (TIA), and cerebral infarction without residual deficits: Secondary | ICD-10-CM | POA: Diagnosis not present

## 2018-02-16 DIAGNOSIS — G459 Transient cerebral ischemic attack, unspecified: Principal | ICD-10-CM | POA: Diagnosis present

## 2018-02-16 LAB — DIFFERENTIAL
BASOS PCT: 1 %
Basophils Absolute: 0 10*3/uL (ref 0.0–0.1)
EOS ABS: 0.1 10*3/uL (ref 0.0–0.7)
EOS PCT: 1 %
Lymphocytes Relative: 44 %
Lymphs Abs: 3.6 10*3/uL (ref 0.7–4.0)
Monocytes Absolute: 0.6 10*3/uL (ref 0.1–1.0)
Monocytes Relative: 7 %
NEUTROS ABS: 4 10*3/uL (ref 1.7–7.7)
Neutrophils Relative %: 47 %

## 2018-02-16 LAB — COMPREHENSIVE METABOLIC PANEL
ALT: 28 U/L (ref 17–63)
ANION GAP: 11 (ref 5–15)
AST: 36 U/L (ref 15–41)
Albumin: 3.6 g/dL (ref 3.5–5.0)
Alkaline Phosphatase: 64 U/L (ref 38–126)
BUN: 20 mg/dL (ref 6–20)
CHLORIDE: 108 mmol/L (ref 101–111)
CO2: 22 mmol/L (ref 22–32)
Calcium: 9.2 mg/dL (ref 8.9–10.3)
Creatinine, Ser: 1.46 mg/dL — ABNORMAL HIGH (ref 0.61–1.24)
GFR calc non Af Amer: 45 mL/min — ABNORMAL LOW (ref >60)
GFR, EST AFRICAN AMERICAN: 52 mL/min — AB (ref >60)
Glucose, Bld: 175 mg/dL — ABNORMAL HIGH (ref 65–99)
POTASSIUM: 3.9 mmol/L (ref 3.5–5.1)
SODIUM: 141 mmol/L (ref 135–145)
Total Bilirubin: 0.6 mg/dL (ref 0.3–1.2)
Total Protein: 6.9 g/dL (ref 6.5–8.1)

## 2018-02-16 LAB — I-STAT CHEM 8, ED
BUN: 23 mg/dL — ABNORMAL HIGH (ref 6–20)
Calcium, Ion: 1.14 mmol/L — ABNORMAL LOW (ref 1.15–1.40)
Chloride: 108 mmol/L (ref 101–111)
Creatinine, Ser: 1.4 mg/dL — ABNORMAL HIGH (ref 0.61–1.24)
Glucose, Bld: 174 mg/dL — ABNORMAL HIGH (ref 65–99)
HCT: 40 % (ref 39.0–52.0)
HEMOGLOBIN: 13.6 g/dL (ref 13.0–17.0)
POTASSIUM: 3.8 mmol/L (ref 3.5–5.1)
SODIUM: 143 mmol/L (ref 135–145)
TCO2: 24 mmol/L (ref 22–32)

## 2018-02-16 LAB — PROTIME-INR
INR: 1.05
PROTHROMBIN TIME: 13.6 s (ref 11.4–15.2)

## 2018-02-16 LAB — APTT: aPTT: 28 seconds (ref 24–36)

## 2018-02-16 LAB — CBC
HCT: 41 % (ref 39.0–52.0)
Hemoglobin: 13.8 g/dL (ref 13.0–17.0)
MCH: 28.3 pg (ref 26.0–34.0)
MCHC: 33.7 g/dL (ref 30.0–36.0)
MCV: 84.2 fL (ref 78.0–100.0)
PLATELETS: 211 10*3/uL (ref 150–400)
RBC: 4.87 MIL/uL (ref 4.22–5.81)
RDW: 14.6 % (ref 11.5–15.5)
WBC: 8.3 10*3/uL (ref 4.0–10.5)

## 2018-02-16 LAB — I-STAT TROPONIN, ED: Troponin i, poc: 0.04 ng/mL (ref 0.00–0.08)

## 2018-02-16 NOTE — ED Triage Notes (Signed)
Pt states yesterday his left forearm was suddenly numb, around midday (unsure of what time). Last night around 1915 he was talking and suddenly he was unable to say the words he was trying to say. (speech is clear at present). Pt states this aphasia resolved after 5-10 minutes. Pt states he was baseline for the rest of the night. Then about noon his left upper lip became numb and tingling. That lasted 3 minutes and went away. Pt states he is at baseline presently.

## 2018-02-17 ENCOUNTER — Observation Stay (HOSPITAL_BASED_OUTPATIENT_CLINIC_OR_DEPARTMENT_OTHER)
Admit: 2018-02-17 | Discharge: 2018-02-17 | Disposition: A | Discharge status: Home / Self Care | Payer: Medicare HMO | Attending: Internal Medicine | Admitting: Internal Medicine

## 2018-02-17 ENCOUNTER — Observation Stay (HOSPITAL_BASED_OUTPATIENT_CLINIC_OR_DEPARTMENT_OTHER): Payer: Medicare HMO

## 2018-02-17 ENCOUNTER — Observation Stay (HOSPITAL_COMMUNITY): Payer: Medicare HMO

## 2018-02-17 DIAGNOSIS — G459 Transient cerebral ischemic attack, unspecified: Secondary | ICD-10-CM | POA: Diagnosis not present

## 2018-02-17 DIAGNOSIS — G47419 Narcolepsy without cataplexy: Secondary | ICD-10-CM

## 2018-02-17 DIAGNOSIS — G4733 Obstructive sleep apnea (adult) (pediatric): Secondary | ICD-10-CM | POA: Diagnosis not present

## 2018-02-17 DIAGNOSIS — E785 Hyperlipidemia, unspecified: Secondary | ICD-10-CM

## 2018-02-17 DIAGNOSIS — Z950 Presence of cardiac pacemaker: Secondary | ICD-10-CM | POA: Diagnosis not present

## 2018-02-17 DIAGNOSIS — E119 Type 2 diabetes mellitus without complications: Secondary | ICD-10-CM | POA: Diagnosis not present

## 2018-02-17 DIAGNOSIS — R479 Unspecified speech disturbances: Secondary | ICD-10-CM | POA: Diagnosis not present

## 2018-02-17 DIAGNOSIS — I10 Essential (primary) hypertension: Secondary | ICD-10-CM | POA: Diagnosis not present

## 2018-02-17 DIAGNOSIS — E1151 Type 2 diabetes mellitus with diabetic peripheral angiopathy without gangrene: Secondary | ICD-10-CM

## 2018-02-17 DIAGNOSIS — J9811 Atelectasis: Secondary | ICD-10-CM | POA: Diagnosis not present

## 2018-02-17 DIAGNOSIS — R200 Anesthesia of skin: Secondary | ICD-10-CM | POA: Diagnosis not present

## 2018-02-17 DIAGNOSIS — E1169 Type 2 diabetes mellitus with other specified complication: Secondary | ICD-10-CM

## 2018-02-17 LAB — LIPID PANEL
CHOL/HDL RATIO: 3.3 ratio
Cholesterol: 119 mg/dL (ref 0–200)
HDL: 36 mg/dL — ABNORMAL LOW (ref >40)
LDL Cholesterol: 61 mg/dL (ref 0–99)
TRIGLYCERIDES: 108 mg/dL (ref <150)
VLDL: 22 mg/dL (ref 0–40)

## 2018-02-17 LAB — HEMOGLOBIN A1C
Hgb A1c MFr Bld: 8.6 % — ABNORMAL HIGH (ref 4.8–5.6)
MEAN PLASMA GLUCOSE: 200.12 mg/dL

## 2018-02-17 LAB — ECHOCARDIOGRAM COMPLETE
HEIGHTINCHES: 67 in
Weight: 3820.13 oz

## 2018-02-17 LAB — CBG MONITORING, ED: Glucose-Capillary: 114 mg/dL — ABNORMAL HIGH (ref 65–99)

## 2018-02-17 LAB — GLUCOSE, CAPILLARY: Glucose-Capillary: 204 mg/dL — ABNORMAL HIGH (ref 65–99)

## 2018-02-17 MED ORDER — AMPHETAMINE-DEXTROAMPHETAMINE 10 MG PO TABS
10.0000 mg | ORAL_TABLET | Freq: Every day | ORAL | Status: DC
  Administered 2018-02-17 – 2018-02-18 (×2): 10 mg via ORAL
  Filled 2018-02-17 (×2): qty 1

## 2018-02-17 MED ORDER — CLOPIDOGREL BISULFATE 300 MG PO TABS
300.0000 mg | ORAL_TABLET | Freq: Once | ORAL | Status: AC
  Administered 2018-02-17: 300 mg via ORAL
  Filled 2018-02-17: qty 1

## 2018-02-17 MED ORDER — ACETAMINOPHEN 325 MG PO TABS: 650.0000 mg | ORAL_TABLET | ORAL | Status: DC | PRN

## 2018-02-17 MED ORDER — ACETAMINOPHEN 160 MG/5ML PO SOLN: 650.0000 mg | ORAL | Status: DC | PRN

## 2018-02-17 MED ORDER — ACETAMINOPHEN 650 MG RE SUPP: 650.0000 mg | RECTAL | Status: DC | PRN

## 2018-02-17 MED ORDER — LISINOPRIL 20 MG PO TABS
20.0000 mg | ORAL_TABLET | Freq: Every day | ORAL | Status: DC
  Administered 2018-02-17 – 2018-02-18 (×2): 20 mg via ORAL
  Filled 2018-02-17 (×2): qty 1

## 2018-02-17 MED ORDER — ADULT MULTIVITAMIN W/MINERALS CH
1.0000 | ORAL_TABLET | Freq: Every day | ORAL | Status: DC
Start: 2018-02-17 — End: 2018-02-18
  Administered 2018-02-17 – 2018-02-18 (×2): 1 via ORAL
  Filled 2018-02-17 (×2): qty 1

## 2018-02-17 MED ORDER — STROKE: EARLY STAGES OF RECOVERY BOOK
Freq: Once | Status: DC
  Filled 2018-02-17: qty 1

## 2018-02-17 MED ORDER — METFORMIN HCL ER 750 MG PO TB24
2000.0000 mg | ORAL_TABLET | Freq: Every day | ORAL | Status: DC
  Administered 2018-02-17 – 2018-02-18 (×2): 2000 mg via ORAL
  Filled 2018-02-17 (×2): qty 1

## 2018-02-17 MED ORDER — SENNOSIDES-DOCUSATE SODIUM 8.6-50 MG PO TABS: 1.0000 | ORAL_TABLET | Freq: Every evening | ORAL | Status: DC | PRN

## 2018-02-17 MED ORDER — ATORVASTATIN CALCIUM 20 MG PO TABS
20.0000 mg | ORAL_TABLET | Freq: Every day | ORAL | Status: DC
  Administered 2018-02-17 – 2018-02-18 (×2): 20 mg via ORAL
  Filled 2018-02-17 (×2): qty 1

## 2018-02-17 MED ORDER — ASPIRIN 325 MG PO TABS
325.0000 mg | ORAL_TABLET | Freq: Every day | ORAL | Status: DC
  Administered 2018-02-17 – 2018-02-18 (×3): 325 mg via ORAL
  Filled 2018-02-17 (×3): qty 1

## 2018-02-17 MED ORDER — ZOLPIDEM TARTRATE 5 MG PO TABS: 5.0000 mg | ORAL_TABLET | Freq: Every evening | ORAL | Status: DC | PRN

## 2018-02-17 MED ORDER — ENOXAPARIN SODIUM 40 MG/0.4ML ~~LOC~~ SOLN
40.0000 mg | SUBCUTANEOUS | Status: DC
  Administered 2018-02-17 – 2018-02-18 (×2): 40 mg via SUBCUTANEOUS
  Filled 2018-02-17 (×3): qty 0.4

## 2018-02-17 NOTE — ED Notes (Signed)
MRI called about pt's pacemaker. MRI will have to wait till tomorrow so [employer] and a nurse can watch the pt in the scan.

## 2018-02-17 NOTE — Plan of Care (Signed)
  Problem: Education: Goal: Knowledge of General Education information will improve Outcome: Progressing   Problem: Health Behavior/Discharge Planning: Goal: Ability to manage health-related needs will improve Outcome: Progressing   Problem: Clinical Measurements: Goal: Ability to maintain clinical measurements within normal limits will improve Outcome: Progressing Goal: Diagnostic test results will improve Outcome: Progressing Goal: Cardiovascular complication will be avoided Outcome: Progressing   Problem: Safety: Goal: Ability to remain free from injury will improve Outcome: Progressing   Problem: Education: Goal: Knowledge of disease or condition will improve Outcome: Progressing Goal: Knowledge of secondary prevention will improve Outcome: Progressing   Problem: Ischemic Stroke/TIA Tissue Perfusion: Goal: Complications of ischemic stroke/TIA will be minimized Outcome: Progressing

## 2018-02-17 NOTE — ED Triage Notes (Signed)
Echo-Tech at bedside.  

## 2018-02-17 NOTE — H&P (Signed)
History and Physical    Jordan [name] [id number] DOB: 05-21-1941 DOA: 02/16/2018  PCP: [name], MD  Patient coming from: Home  I have personally briefly reviewed patient's old medical records in [place]  Chief Complaint: Aphasia resolved  HPI: Jordan [name] is a 77 y.o. male with medical history significant of HTN, DM2, PPM.  Patient presents to the ED for multiple episodes of recurrent neurologic symtoms over the past 48 hours.  Starting 2 days ago he had an episode where he had difficulty speaking.  He was told that speech was incoherent and inappropriate.  This lasted for about 5 min then resolved.  Yesterday he had numbness to L forearm but no weakness.  This episode lasted ~1hr before resolving.  This afternoon developed numbness to L side of face.  Presents to ED for evaluation of this.   ED Course: Facial numbness resolved.  CT head suggestive of multiple old strokes however.   Review of Systems: As per HPI otherwise 10 point review of systems negative.   Past Medical History:  Diagnosis Date  . Arthritis    R shoulder, bone spur  . Arthritis    "hands" (03/25/2016)  . Chronic lower back pain   . Coronary heart disease    Dr Pernell Dupre  . H/O cardiovascular stress test    perhaps last one was 2009  . Heart murmur    12/29/11 echo: mild MR, no AS, trivial TR  . History of gout   . Hyperlipidemia   . Hypertension    saw Dr. Linard Millers last earl;y- 2014, cardiac cath. last done ?2009, blocks seen didn't require any intervention at that point.  . Narcolepsy    [name] 04-11-97; Mean Latency 1.53min, SOREM 2  . OSA on CPAP    NPSG 12-13-98 AHI 22.7  . PONV (postoperative nausea and vomiting)   . Presence of permanent cardiac pacemaker   . Shortness of breath    with exertion   . Type II diabetes mellitus ([place])     Past Surgical History:  Procedure Laterality Date  . CARDIAC CATHETERIZATION  2009?  . EP IMPLANTABLE DEVICE N/A 03/25/2016   Procedure: Pacemaker Implant - Dual Chamber;  Surgeon: Evans Lance, MD;  Location: [place] [facility];  Service: Cardiovascular;  Laterality: N/A;  . FRACTURE SURGERY    . INSERT / REPLACE / REMOVE PACEMAKER  03/24/2016  . LEFT AND RIGHT HEART CATHETERIZATION WITH CORONARY ANGIOGRAM N/A 06/05/2014   Procedure: LEFT AND RIGHT HEART CATHETERIZATION WITH CORONARY ANGIOGRAM;  Surgeon: Sinclair Grooms, MD;  Location: [facility] CATH LAB;  Service: Cardiovascular;  Laterality: N/A;  . NASAL FRACTURE SURGERY    . SHOULDER ARTHROSCOPY WITH ROTATOR CUFF REPAIR AND SUBACROMIAL DECOMPRESSION Right 08/16/2013   Procedure: SHOULDER ARTHROSCOPY WITH ROTATOR CUFF REPAIR AND SUBACROMIAL DECOMPRESSION;  Surgeon: [name], MD;  Location: [facility];  Service: Orthopedics;  Laterality: Right;  Right shoulder arthroscopy rotator cuff repair, subacromial decompression.  . TONSILLECTOMY       reports that he quit smoking about 50 years ago. His smoking use included cigarettes. He has a 15.00 pack-year smoking history. He has never used smokeless tobacco. He reports that he drinks alcohol. He reports that he does not use drugs.  No Known Allergies  Family History  Problem Relation Age of Onset  . Sleep apnea Sister   . Colon cancer Sister      Prior to Admission medications   Medication Sig Start  Date End Date Taking? Authorizing Provider  amphetamine-dextroamphetamine (ADDERALL) 10 MG tablet Take 1-3 tablets (10-30 mg total) by mouth daily. As directed Patient taking differently: Take 10 mg by mouth daily. Can take another tablet daily if needed 02/01/18  Yes Young, Tarri Fuller D, MD  atorvastatin (LIPITOR) 40 MG tablet Take 20 mg by mouth daily before breakfast.    Yes [provider]  glucosamine-chondroitin 500-400 MG tablet Take 2 tablets by mouth daily.   Yes [provider]  lisinopril (PRINIVIL,ZESTRIL) 20 MG tablet Take 1 tablet (20 mg total) by mouth daily. 05/18/17 02/17/25 Yes Evans Lance, MD  metFORMIN (GLUCOPHAGE-XR) 500 MG 24 hr tablet Take 2,000 mg by mouth daily with breakfast.  08/30/16  Yes [provider]  Multiple Vitamin (MULTIVITAMIN) tablet Take 1 tablet by mouth daily.   Yes [provider]  NITROSTAT 0.4 MG SL tablet PLACE 1 TABLET (0.4 MG TOTAL) UNDER THE TONGUE EVERY 5 (FIVE) MINUTES AS NEEDED FOR CHEST PAIN. 03/31/16  Yes Belva Crome, MD  Omega 3 1200 MG CAPS Take 7,200 mg by mouth daily.    Yes [provider]  zaleplon (SONATA) 10 MG capsule 1 at night for sleep if needed Patient taking differently: Take 10 mg by mouth at bedtime as needed for sleep.  02/01/18  Yes Young, Tarri Fuller D, MD  ONE TOUCH ULTRA TEST test strip Apply 1 strip topically as directed. 12/29/15   [provider]    Physical Exam: Vitals:   02/17/18 0015 02/17/18 0030 02/17/18 0045 02/17/18 0100  BP: (!) 149/90 (!) 163/91 (!) 158/76 (!) 153/81  Pulse: 78 65 71 70  Resp: (!) 24 19 20 10   Temp:      TempSrc:      SpO2: 97% 95% 95% 97%    Constitutional: NAD, calm, comfortable Eyes: PERRL, lids and conjunctivae normal ENMT: Mucous membranes are moist. Posterior pharynx clear of any exudate or lesions.Normal dentition.  Neck: normal, supple, no masses, no thyromegaly Respiratory: clear to auscultation bilaterally, no wheezing, no crackles. Normal respiratory effort. No accessory muscle use.  Cardiovascular: Regular rate and rhythm, no murmurs / rubs / gallops. No extremity edema. 2+ pedal pulses. No carotid bruits.  Abdomen: no tenderness, no masses palpated. No hepatosplenomegaly. Bowel sounds positive.  Musculoskeletal: no clubbing / cyanosis. No joint deformity upper and lower extremities. Good ROM, no contractures. Normal muscle tone.  Skin: no rashes, lesions, ulcers. No induration Neurologic: CN 2-12 grossly intact. Sensation intact, DTR normal. Strength 5/5 in all 4.  Psychiatric: Normal judgment and insight. Alert and oriented x 3.  Normal mood.    Labs on Admission: I have personally reviewed following labs and imaging studies  CBC: Recent Labs  Lab 02/16/18 1613 02/16/18 1620  WBC 8.3  --   NEUTROABS 4.0  --   HGB 13.8 13.6  HCT 41.0 40.0  MCV 84.2  --   PLT 211  --    Basic Metabolic Panel: Recent Labs  Lab 02/16/18 1613 02/16/18 1620  NA 141 143  K 3.9 3.8  CL 108 108  CO2 22  --   GLUCOSE 175* 174*  BUN 20 23*  CREATININE 1.46* 1.40*  CALCIUM 9.2  --    GFR: CrCl cannot be calculated (Unknown ideal weight.). Liver Function Tests: Recent Labs  Lab 02/16/18 1613  AST 36  ALT 28  ALKPHOS 64  BILITOT 0.6  PROT 6.9  ALBUMIN 3.6   No results for input(s): LIPASE, AMYLASE in  the last 168 hours. No results for input(s): AMMONIA in the last 168 hours. Coagulation Profile: Recent Labs  Lab 02/16/18 1613  INR 1.05   Cardiac Enzymes: No results for input(s): CKTOTAL, CKMB, CKMBINDEX, TROPONINI in the last 168 hours. BNP (last 3 results) No results for input(s): PROBNP in the last 8760 hours. HbA1C: No results for input(s): HGBA1C in the last 72 hours. CBG: No results for input(s): GLUCAP in the last 168 hours. Lipid Profile: No results for input(s): CHOL, HDL, LDLCALC, TRIG, CHOLHDL, LDLDIRECT in the last 72 hours. Thyroid Function Tests: No results for input(s): TSH, T4TOTAL, FREET4, T3FREE, THYROIDAB in the last 72 hours. Anemia Panel: No results for input(s): VITAMINB12, FOLATE, FERRITIN, TIBC, IRON, RETICCTPCT in the last 72 hours. Urine analysis: No results found for: COLORURINE, APPEARANCEUR, [place], [place], GLUCOSEU, HGBUR, BILIRUBINUR, KETONESUR, PROTEINUR, UROBILINOGEN, NITRITE, LEUKOCYTESUR  Radiological Exams on Admission: Ct Head Wo Contrast  Result Date: 02/16/2018 CLINICAL DATA:  Numbness LEFT side of face.  Symptoms for 1 month. EXAM: CT HEAD WITHOUT CONTRAST TECHNIQUE: Contiguous axial images were obtained from the base of the skull through the vertex without  intravenous contrast. COMPARISON:  None. FINDINGS: Brain: No evidence for acute infarction, hemorrhage, mass lesion, hydrocephalus, or extra-axial fluid. Generalized atrophy. Hypoattenuation of the white matter, likely small vessel disease. LEFT frontal brain substance loss, likely old trauma or infarct. 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 Vascular: Calcification of the cavernous internal carotid arteries and distal vertebral arteries consistent with cerebrovascular atherosclerotic disease. No signs of intracranial large vessel occlusion. Skull: Normal. Negative for fracture or focal lesion. Sinuses/Orbits: BILATERAL cataract extraction.  No sinus fluid. Other: None. IMPRESSION: Atrophy and small vessel disease. 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 No acute intracranial findings. Electronically Signed   By: Staci Righter M.D.   On: 02/16/2018 17:07    EKG: Independently reviewed.  Assessment/Plan Principal Problem:   TIA (transient ischemic attack) Active Problems:   Obstructive sleep apnea   Narcolepsy without cataplexy   Essential hypertension   Presence of permanent cardiac pacemaker   DM2 (diabetes mellitus, type 2) ([place])    1. TIA - 1. Neuro consult 2. TIA / stroke pathway and work up 3. MRI/MRA - does have PPM but believed to be an MRI compatible model 4. PT/OT/SLP 5. 2d echo 6. Carotid duplex 7. Will put on ASA 325 2. HTN - plan to continue home lisinopril for now 3. DM2 - continue metformin 4. OSA - continue CPAP 5. Narcolepsy - continue adderall  DVT prophylaxis: Lovenox Code Status: Full Family Communication: No family in room Disposition Plan: Home after admit Consults called: EDP called neurology Admission status: Place in 53   Kamden Reber, [facility] Pager [phone]  If 7AM-7PM, please contact day team taking care of patient [email or web address] Password [email or web address]  02/17/2018, 1:59 AM

## 2018-02-17 NOTE — ED Notes (Signed)
Heart healthy carb modified dinner tray ordered 

## 2018-02-17 NOTE — ED Notes (Signed)
Patient transported to X-ray 

## 2018-02-17 NOTE — Consult Note (Signed)
Neurology Consultation Reason for Consult: TIA Referring Physician: Sheran Luz  CC: Speech difficulty  History is obtained from: Patient  HPI: Jordan [name] is a 77 y.o. male with 3 episodes concerning for neurological symptoms over the past 2 days.  First happened on Wednesday when he was talking with some friends.  He noticed that suddenly the words are coming out of his mouth were different than the words that he was attempting to say.  He did not have any trouble understanding people at the time.  This lasted for about 5 minutes.  He has had 2 episodes since that time both lasting a few minutes each.  The first involved his left arm with numbness in the left forearm.  This resolved and later, his left face became partially numb which also resolved.  LKW: 4/17 tpa given?: no, resolution of symptoms    ROS: A 14 point ROS was performed and is negative except as noted in the HPI.   Past Medical History:  Diagnosis Date  . Arthritis    R shoulder, bone spur  . Arthritis    "hands" (03/25/2016)  . Chronic lower back pain   . Coronary heart disease    Dr Pernell Dupre  . H/O cardiovascular stress test    perhaps last one was 2009  . Heart murmur    12/29/11 echo: mild MR, no AS, trivial TR  . History of gout   . Hyperlipidemia   . Hypertension    saw Dr. Linard Millers last earl;y- 2014, cardiac cath. last done ?2009, blocks seen didn't require any intervention at that point.  . Narcolepsy    [name] 04-11-97; Mean Latency 1.89min, SOREM 2  . OSA on CPAP    NPSG 12-13-98 AHI 22.7  . PONV (postoperative nausea and vomiting)   . Presence of permanent cardiac pacemaker   . Shortness of breath    with exertion   . Type II diabetes mellitus ([facility])      Family History  Problem Relation Age of Onset  . Sleep apnea Sister   . Colon cancer Sister      Social History:  reports that he quit smoking about 50 years ago. His smoking use included cigarettes. He has a 15.00 pack-year smoking  history. He has never used smokeless tobacco. He reports that he drinks alcohol. He reports that he does not use drugs.   Exam: Current vital signs: BP (!) 164/94   Pulse 72   Temp 98.4 F (36.9 C) (Oral)   Resp 13   Ht 5\' 7"  (1.702 m)   Wt 108.3 kg (238 lb 12.1 oz)   SpO2 96%   BMI 37.39 kg/m  Vital signs in last 24 hours: Temp:  [98.4 F (36.9 C)] 98.4 F (36.9 C) (04/18 1600) Pulse Rate:  [63-78] 72 (04/19 0215) Resp:  [10-24] 13 (04/19 0215) BP: (108-164)/(72-98) 164/94 (04/19 0215) SpO2:  [95 %-98 %] 96 % (04/19 0215) Weight:  [108.3 kg (238 lb 12.1 oz)] 108.3 kg (238 lb 12.1 oz) (04/19 0215)   Physical Exam  Constitutional: Appears well-developed and well-nourished.  Psych: Affect appropriate to situation Eyes: No scleral injection HENT: No OP obstrucion Head: Normocephalic.  Cardiovascular: Normal rate and regular rhythm.  Respiratory: Effort normal, non-labored breathing GI: Soft.  No distension. There is no tenderness.  Skin: WDI  Neuro: Mental Status: Patient is awake, alert, oriented to person, place, month, year, and situation. Patient is able to give a clear and coherent history. No  signs of aphasia or neglect Cranial Nerves: II: Visual Fields are full. Pupils are equal, round, and reactive to light.   III,IV, VI: EOMI without ptosis or diploplia.  V: Facial sensation is symmetric to temperature VII: Facial movement is symmetric.  VIII: hearing is intact to voice X: Uvula elevates symmetrically XI: Shoulder shrug is symmetric. XII: tongue is midline without atrophy or fasciculations.  Motor: Tone is normal. Bulk is normal. 5/5 strength was present in all four extremities.  Sensory: Sensation is symmetric to light touch and temperature in the arms and legs. Cerebellar: FNF  intact bilaterally      I have reviewed labs in epic and the results pertinent to this consultation are: CMP-glucose 175 Creatinine 1.46  I have reviewed the images  obtained: CT head is negative  Impression: 77 year old male with 3 episodes concerning for transient neurological symptoms that would be most consistent with TIA.  Given that he has bilateral symptoms, this would be most concerning for possible cardiac source.  With ABCD 2 score greater than 4, I would start dual antiplatelet therapy with the plan to continue for 3 weeks.  Recommendations: 1. HgbA1c, fasting lipid panel 2. MRI, MRA  of the brain without contrast 3. Frequent neuro checks 4. Echocardiogram 5. Carotid dopplers 6. Prophylactic therapy-Antiplatelet med: Aspirin - dose 325mg  PO or 300mg  PR plus clopidogrel 75 mg daily following 300 mg load for 3 weeks 7. Risk factor modification 8. Telemetry monitoring 9. please page stroke NP  Or  PA  Or MD  from 8am -4 pm as this patient will be followed by the stroke team at this point.   You can look them up on [email or web address]     Roland Rack, MD [facility] [phone]  If 7pm- 7am, please page neurology on call as listed in [place].

## 2018-02-17 NOTE — ED Notes (Signed)
Pt back in room from X-ray.

## 2018-02-17 NOTE — Progress Notes (Signed)
STROKE TEAM PROGRESS NOTE   HISTORY OF PRESENT ILLNESS (per record) Jordan [name] is a 77 y.o. male with 3 episodes concerning for neurological symptoms over the past 2 days.  First happened on Wednesday when he was talking with some friends.  He noticed that suddenly the words coming out of his mouth were different than the words that he was attempting to say.  He did not have any trouble understanding people at the time.  This lasted for about 5 minutes.  He has had 2 episodes since that time both lasting a few minutes each.  The first involved his left arm with numbness in the left forearm.  This resolved and later, his left face became partially numb which also resolved.  LKW: 4/17 tpa given?: no, resolution of symptoms     SUBJECTIVE (INTERVAL HISTORY) His RN is at the bedside.   Patient has history of presenting illness was reviewed in details.  He gives history of multifocal stuttering symptoms of transient speech difficulties followed next day by left forearm numbness and the third day by perioral numbness in a pattern suggestive of Cheiro oral paresthesias from small vessel disease TIAs   OBJECTIVE Temp:  [98.4 F (36.9 C)] 98.4 F (36.9 C) (04/18 1600) Pulse Rate:  [55-78] 64 (04/19 1425) Resp:  [10-24] 16 (04/19 0730) BP: (108-167)/(72-98) 154/88 (04/19 1424) SpO2:  [90 %-98 %] 97 % (04/19 1425) Weight:  [238 lb 12.1 oz (108.3 kg)] 238 lb 12.1 oz (108.3 kg) (04/19 0215)  CBC:  Recent Labs  Lab 02/16/18 1613 02/16/18 1620  WBC 8.3  --   NEUTROABS 4.0  --   HGB 13.8 13.6  HCT 41.0 40.0  MCV 84.2  --   PLT 211  --     Basic Metabolic Panel:  Recent Labs  Lab 02/16/18 1613 02/16/18 1620  NA 141 143  K 3.9 3.8  CL 108 108  CO2 22  --   GLUCOSE 175* 174*  BUN 20 23*  CREATININE 1.46* 1.40*  CALCIUM 9.2  --     Lipid Panel:     Component Value Date/Time   CHOL 119 02/17/2018 0232   TRIG 108 02/17/2018 0232   HDL 36 (L) 02/17/2018 0232   CHOLHDL 3.3  02/17/2018 0232   VLDL 22 02/17/2018 0232   LDLCALC 61 02/17/2018 0232   HgbA1c:  Lab Results  Component Value Date   HGBA1C 8.6 (H) 02/17/2018   Urine Drug Screen: No results found for: LABOPIA, COCAINSCRNUR, LABBENZ, AMPHETMU, THCU, LABBARB  Alcohol Level No results found for: [facility]  IMAGING   Dg Chest 2 View 02/17/2018 IMPRESSION:  No active cardiopulmonary disease. Right upper lobe calcified granuloma, stable in appearance. Aortic atherosclerosis.   Ct Head Wo Contrast 02/16/2018 IMPRESSION:  Atrophy and small vessel disease. 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 No acute intracranial findings.    [name] Head Wo Contrast 02/17/2018 IMPRESSION:  1. Punctate foci of acute ischemia within both frontal and parietal lobes and the right cerebellar hemisphere. No associated hemorrhage or mass effect.  2. The distribution of ischemic lesions involves multiple vascular territories and is suggestive of a central cardiac or proximal aortic embolic source. CTA of the neck may be helpful for further assessment.  3. Mild right middle cerebral artery M2 segment irregularity may indicate mild atherosclerotic disease, though the source data images suggest that this might be artifactual. No occlusion or hemodynamically significant stenosis within the intracranial circulation.  4. Multiple old infarcts,  including left frontal cortical infarct, and findings of chronic ischemic microangiopathy.      Transthoracic Echocardiogram - pending 00/00/00    Bilateral Carotid Dopplers - pending 02/17/2018 Impression Right Carotid: Velocities in the right ICA are consistent with a 1-39% stenosis. Left Carotid: Velocities in the left ICA are consistent with a 1-39% stenosis. Vertebrals: Bilateral vertebral arteries demonstrate antegrade flow.    PHYSICAL EXAM Vitals:   02/17/18 0945 02/17/18 1000 02/17/18 1424 02/17/18 1425  BP: 123/80 139/81 (!) 154/88   Pulse:    64  Resp:       Temp:      TempSrc:      SpO2:    97%  Weight:      Height:       Obese middle aged Caucasian male not in distress. . Afebrile. Head is nontraumatic. Neck is supple without bruit.    Cardiac exam no murmur or gallop. Lungs are clear to auscultation. Distal pulses are well felt.  Neurological Exam ;  Awake  Alert oriented x 3. Normal speech and language.eye movements full without nystagmus.fundi were not visualized. Vision acuity and fields appear normal. Hearing is normal. Palatal movements are normal. Face symmetric. Tongue midline. Normal strength, tone, reflexes and coordination. Normal sensation. Gait deferred.         ASSESSMENT/PLAN Jordan [name] is a 77 y.o. male with history of coronary artery disease, previous strokes by imaging, diabetes mellitus, permanent cardiac pacemaker, obstructive sleep apnea on C Pap, narcolepsy, hypertension, and hyperlipidemia, presenting with speech difficulties, with left upper extremity numbness and weakness. He did not receive IV t-PA due to resolution of deficits.   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  MRI head - Punctate foci of acute ischemia within both frontal and parietal lobes and the right cerebellar hemisphere  MRA head - no high-grade stenosis or occlusion  Carotid Doppler - unremarkable  2D Echo - pending  LDL - 61  HgbA1c - 8.6  VTE prophylaxis - Lovenox Fall precautions Diet heart healthy/carb modified Room service appropriate? Yes; Fluid consistency: Thin  No antithrombotic prior to admission, now on aspirin 325 mg daily with Plavix 75 mg daily  Patient counseled to be compliant with his antithrombotic medications  Ongoing aggressive stroke risk factor management  Therapy recommendations:  pending  Disposition:  Pending  Hypertension  Stable  Permissive  hypertension (OK if < 220/120) but gradually normalize in 5-7 days . Long-term BP goal normotensive  Hyperlipidemia  Lipid lowering medication PTA:  Lipitor 20 mg daily  LDL 61, goal < 70  Current lipid lowering medication: Lipitor 20 mg daily  Continue statin at discharge  Diabetes  HgbA1c 8.6, goal < 7.0  Uncontrolled  Other Stroke Risk Factors  Advanced age  Former cigarette smoker - quit  ETOH use, advised to drink no more than 1 alcoholic beverage per day.  Obesity, Body mass index is 37.39 kg/m., recommend weight loss, diet and exercise as appropriate   Hx stroke/TIA  - previous infarcts by imaging  Coronary artery disease  Obstructive sleep apnea with C Pap  Other Active Problems  BUN 23 ; creatinine 1.40   Plan / Recommendations   Stroke workup: awaiting - -D echo  Therapy Follow Up: pending  Disposition: pending  Antiplatelet / Anticoagulation: DAP therapy x 3 weeks per Dr Leonel Ramsay  Statin: Lipitor 20 mg  daily  MD Follow Up: pending  Other: pending  Further risk factor modification per primary care MD: Follow Up 2 weeks   Hospital day # 0   I have personally examined this patient, reviewed notes, independently viewed imaging studies, participated in medical decision making and plan of care.ROS completed by me personally and pertinent positives fully documented  I have made any additions or clarifications directly to the above note.  He presented with transient fleeting multifocal symptoms and MRI scan shows tiny punctate white matter infarcts in both cerebral hemispheres and  Right cerebellum likely due to small vessel disease.  Recommend dual antiplatelet therapy for 3 weeks followed by aspirin alone.  Continue ongoing stroke work-up and aggressive risk factor modification.  Discussed with patient and Dr. Manuella Ghazi.  Greater than 50% time during this 35 minute visit was spent on counseling and coordination of care about his lacunar infarcts and  answering questions.  Antony Contras, MD Medical Director [facility] Pager: [phone] 02/17/2018 5:23 PM   To contact Stroke Continuity provider, please refer to [email or web address] After hours, contact General Neurology

## 2018-02-17 NOTE — Progress Notes (Signed)
  Echocardiogram 2D Echocardiogram has been performed.  Randa Lynn Vicky Schleich 02/17/2018, 12:52 PM

## 2018-02-17 NOTE — ED Provider Notes (Signed)
[facility] EMERGENCY DEPARTMENT Provider Note   CSN: [id number] Arrival date & time: 02/16/18  1543     History   Chief Complaint Chief Complaint  Patient presents with  . resolved aphasia    HPI Jordan [name] is a 77 y.o. male.  Patient is a 77 year old male with past medical history of hypertension, pacemaker, and diabetes.  He presents today for evaluation of recurrent neurologic symptoms that have occurred over the past 48 hours.  He reports 2 days ago experiencing an episode where he had difficulty speaking.  He states that the words that were coming out of his mouth or not the words that he intended to say.  He was told that his speech was incoherent and inappropriate.  This lasted for approximately 5 minutes, then resolved.  He reports yesterday experiencing some numbness to his left forearm, but no weakness.  This episode lasted for approximately 1 hour before resolving.  This afternoon he developed numbness to the left side of his face, now presents for evaluation of this.  His symptoms have since resolved and he has no complaints at present.  The history is provided by the patient.    Past Medical History:  Diagnosis Date  . Arthritis    R shoulder, bone spur  . Arthritis    "hands" (03/25/2016)  . Chronic lower back pain   . Coronary heart disease    Dr Pernell Dupre  . H/O cardiovascular stress test    perhaps last one was 2009  . Heart murmur    12/29/11 echo: mild MR, no AS, trivial TR  . History of gout   . Hyperlipidemia   . Hypertension    saw Dr. Linard Millers last earl;y- 2014, cardiac cath. last done ?2009, blocks seen didn't require any intervention at that point.  . Narcolepsy    [name] 04-11-97; Mean Latency 1.84min, SOREM 2  . OSA on CPAP    NPSG 12-13-98 AHI 22.7  . PONV (postoperative nausea and vomiting)   . Presence of permanent cardiac pacemaker   . Shortness of breath    with exertion   . Type II diabetes mellitus [facility])      Patient Active Problem List   Diagnosis Date Noted  . Insomnia 02/01/2018  . 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 Dyspnea on exertion 03/03/2014  . HLD (hyperlipidemia) 12/13/2007  . Essential hypertension 12/13/2007  . OBESITY 12/05/2007  . Obstructive sleep apnea 12/05/2007  . 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 Coronary atherosclerosis 12/05/2007    Past Surgical History:  Procedure Laterality Date  . CARDIAC CATHETERIZATION  2009?  . EP IMPLANTABLE DEVICE N/A 03/25/2016   Procedure: Pacemaker Implant - Dual Chamber;  Surgeon: Evans Lance, MD;  Location: [place] CV LAB;  Service: Cardiovascular;  Laterality: N/A;  . FRACTURE SURGERY    . INSERT / REPLACE / REMOVE PACEMAKER  03/24/2016  . LEFT AND RIGHT HEART CATHETERIZATION WITH CORONARY ANGIOGRAM N/A 06/05/2014   Procedure: LEFT AND RIGHT HEART CATHETERIZATION WITH CORONARY ANGIOGRAM;  Surgeon: Sinclair Grooms, MD;  Location: [facility] CATH LAB;  Service: Cardiovascular;  Laterality: N/A;  . NASAL FRACTURE SURGERY    . SHOULDER ARTHROSCOPY WITH ROTATOR CUFF REPAIR AND SUBACROMIAL DECOMPRESSION Right 08/16/2013   Procedure: SHOULDER ARTHROSCOPY WITH ROTATOR CUFF REPAIR AND SUBACROMIAL DECOMPRESSION;  Surgeon: [name], MD;  Location: [facility];  Service: Orthopedics;  Laterality: Right;  Right shoulder arthroscopy rotator  cuff repair, subacromial decompression.  . TONSILLECTOMY          Home Medications    Prior to Admission medications   Medication Sig Start Date End Date Taking? Authorizing Provider  amphetamine-dextroamphetamine (ADDERALL) 10 MG tablet Take 1-3 tablets (10-30 mg total) by mouth daily. As directed 02/01/18   Baird Lyons D, MD  atorvastatin (LIPITOR) 40 MG tablet Take 20 mg by mouth daily before breakfast.     [provider]  glucosamine-chondroitin 500-400 MG tablet Take 2 tablets by mouth daily.    [provider]  lisinopril (PRINIVIL,ZESTRIL) 20 MG tablet Take 1 tablet (20 mg total) by mouth daily. 05/18/17 08/16/17  Evans Lance, MD  metFORMIN (GLUCOPHAGE-XR) 500 MG 24 hr tablet Take 1,000 mg by mouth 2 (two) times daily with a meal. 08/30/16   [provider]  Multiple Vitamin (MULTIVITAMIN) tablet Take 1 tablet by mouth daily.    [provider]  NITROSTAT 0.4 MG SL tablet PLACE 1 TABLET (0.4 MG TOTAL) UNDER THE TONGUE EVERY 5 (FIVE) MINUTES AS NEEDED FOR CHEST PAIN. 03/31/16   Belva Crome, MD  Omega 3 1200 MG CAPS Take 1,200 mg by mouth daily.    [provider]  ONE TOUCH ULTRA TEST test strip Apply 1 strip topically as directed. 12/29/15   [provider]  zaleplon (SONATA) 10 MG capsule 1 at night for sleep if needed 02/01/18   Deneise Lever, MD    Family History Family History  Problem Relation Age of Onset  . Sleep apnea Sister   . Colon cancer Sister     Social History Social History   Tobacco Use  . Smoking status: Former Smoker    Packs/day: 2.50    Years: 6.00    Pack years: 15.00    Types: Cigarettes    Last attempt to quit: 06/05/1967    Years since quitting: 50.7  . Smokeless tobacco: Never Used  Substance Use Topics  . Alcohol use: Yes    Comment: "not often", not regular, on occasion   . Drug use: No     Allergies   Patient has no known allergies.   Review of Systems Review of Systems  All other systems reviewed and are negative.    Physical Exam Updated Vital Signs BP (!) 154/98 (BP Location: Left Arm)   Pulse 76   Temp 98.4 F (36.9 C) (Oral)   Resp (!) 21   SpO2 98%   Physical Exam  Constitutional: He is oriented to person, place, and time. He appears well-developed and well-nourished. No distress.  HENT:  Head: Normocephalic and atraumatic.  Mouth/Throat: Oropharynx is clear and moist.  Eyes: Pupils are equal, round, and reactive to light. EOM are normal.  Neck: Normal range of motion. Neck  supple.  Cardiovascular: Normal rate and regular rhythm. Exam reveals no friction rub.  No murmur heard. Pulmonary/Chest: Effort normal and breath sounds normal. No respiratory distress. He has no wheezes. He has no rales.  Abdominal: Soft. Bowel sounds are normal. He exhibits no distension. There is no tenderness.  Musculoskeletal: Normal range of motion. He exhibits no edema.  Neurological: He is alert and oriented to person, place, and time. No cranial nerve deficit. He exhibits normal muscle tone. Coordination normal.  Skin: Skin is warm and dry. He is not diaphoretic.  Nursing note and vitals reviewed.    ED Treatments / Results  Labs (all labs ordered are listed, but only abnormal results are  displayed) Labs Reviewed  COMPREHENSIVE METABOLIC PANEL - Abnormal; Notable for the following components:      Result Value   Glucose, Bld 175 (*)    Creatinine, Ser 1.46 (*)    GFR calc non Af Amer 45 (*)    GFR calc Af Amer 52 (*)    All other components within normal limits  I-STAT CHEM 8, ED - Abnormal; Notable for the following components:   BUN 23 (*)    Creatinine, Ser 1.40 (*)    Glucose, Bld 174 (*)    Calcium, Ion 1.14 (*)    All other components within normal limits  PROTIME-INR  APTT  CBC  DIFFERENTIAL  I-STAT TROPONIN, ED  CBG MONITORING, ED    EKG EKG Interpretation  Date/Time:  Thursday February 16 2018 15:51:08 EDT Ventricular Rate:  71 PR Interval:  210 QRS Duration: 268 QT Interval:  432 QTC Calculation: 469 R Axis:   -62 Text Interpretation:  Normal sinus rhythm Right bundle branch block Left anterior fascicular block Left ventricular hypertrophy Cannot rule out Septal infarct , age undetermined Marked T wave abnormality, consider lateral ischemia Confirmed by Virgel Manifold [phone]) on 02/16/2018 3:56:05 PM Also confirmed by Virgel Manifold [phone]), editor Philomena Doheny [phone])  on 02/16/2018 4:12:29 PM   Radiology Ct Head Wo Contrast  Result Date:  02/16/2018 CLINICAL DATA:  Numbness LEFT side of face.  Symptoms for 1 month. EXAM: CT HEAD WITHOUT CONTRAST TECHNIQUE: Contiguous axial images were obtained from the base of the skull through the vertex without intravenous contrast. COMPARISON:  None. FINDINGS: Brain: No evidence for acute infarction, hemorrhage, mass lesion, hydrocephalus, or extra-axial fluid. Generalized atrophy. Hypoattenuation of the white matter, likely small vessel disease. LEFT frontal brain substance loss, likely old trauma or infarct. 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 Vascular: Calcification of the cavernous internal carotid arteries and distal vertebral arteries consistent with cerebrovascular atherosclerotic disease. No signs of intracranial large vessel occlusion. Skull: Normal. Negative for fracture or focal lesion. Sinuses/Orbits: BILATERAL cataract extraction.  No sinus fluid. Other: None. IMPRESSION: Atrophy and small vessel disease. 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 No acute intracranial findings. Electronically Signed   By: Staci Righter M.D.   On: 02/16/2018 17:07    Procedures Procedures (including critical care time)  Medications Ordered in ED Medications - No data to display   Initial Impression / Assessment and Plan / ED Course  I have reviewed the triage vital signs and the nursing notes.  Pertinent labs & imaging results that were available during my care of the patient were reviewed by me and considered in my medical decision making (see chart for details).  Patient presents with what appears to be recurring TIA like symptoms over the past 2 days.  His workup thus far is unremarkable.  His care has been discussed with Dr. Leonel Ramsay from neurology.  He is recommending admission to the hospitalist service for a TIA workup.  An MRI has been ordered, however due to the presence of a pacemaker, this test may or may not be able to be performed.  Dr.  Alcario Drought agrees to admit.  Final Clinical Impressions(s) / ED Diagnoses   Final diagnoses:  None    ED Discharge Orders    None       Veryl Speak, MD 02/17/18 0202

## 2018-02-17 NOTE — ED Triage Notes (Signed)
PT to MRI

## 2018-02-17 NOTE — Progress Notes (Signed)
Patient seen and evaluated this morning.  Reviewed H&P this morning from Dr. Alcario Drought.  Jordan [name] is a 77 y.o. male with medical history significant of HTN, DM2, PPM.  Patient presents to the ED for multiple episodes of recurrent neurologic symtoms over the past 48 hours.  Chiefly, he has had difficulty speaking that began 2 days ago along with left forearm numbness.  Initial CT had suggestive of multiple old strokes.  He has been seen by neurology and now stroke team this morning Dr. Leonie Man.  Patient has been started on aspirin full dose plus Plavix load by neurology with plans for MRI/MRA, echocardiogram, and carotid Dopplers.  Continue ongoing workup.  Appreciate further evaluation recommendations by stroke team.

## 2018-02-17 NOTE — ED Triage Notes (Signed)
PT in room from MRI

## 2018-02-17 NOTE — Progress Notes (Signed)
PT Cancellation Note  Patient Details Name: Jordan [name] MRN: [id number] DOB: [birth year]   Cancelled Treatment:    Reason Eval/Treat Not Completed: Patient at procedure or test/unavailable. Have attempted x 2. Earlier pt with 2D echo and now out of room for testing. Will continue attempts.   Shary Decamp [place] 02/17/2018, 2:09 PM [employer] PT [phone]

## 2018-02-17 NOTE — Evaluation (Signed)
Physical Therapy Evaluation Patient Details Name: Jordan [name] MRN: [id number] DOB: [birth year] Today's Date: 02/17/2018   History of Present Illness  Pt presented to ED with intermittent aphasia. MRI revealed Punctate foci of acute ischemia within both frontal and parietal lobes and the right cerebellar hemisphere. PMH - HTN, DM, Pacer, arthritis, gout, back pain  Clinical Impression  Pt presented to PT with mobility that is very close to baseline. No further PT recommended.    Follow Up Recommendations No PT follow up    Equipment Recommendations  None recommended by PT    Recommendations for Other Services       Precautions / Restrictions Precautions Precautions: None      Mobility  Bed Mobility Overal bed mobility: Modified Independent                Transfers Overall transfer level: Modified independent                  Ambulation/Gait Ambulation/Gait assistance: Min guard;Modified independent (Device/Increase time) Ambulation Distance (Feet): 500 Feet Assistive device: None Gait Pattern/deviations: Step-through pattern;Decreased stride length;Drifts right/left   Gait velocity interpretation: >2.62 ft/sec, indicative of community ambulatory General Gait Details: Initially pt slightly unsteady requiring min gaurd but no overt loss of balance. As distance increased pt's stability improved and pt modified independent.  Stairs            Wheelchair Mobility    Modified Rankin (Stroke Patients Only) Modified Rankin (Stroke Patients Only) Pre-Morbid Rankin Score: No significant disability Modified Rankin: No significant disability     Balance Overall balance assessment: Needs assistance Sitting-balance support: No upper extremity supported;Feet supported Sitting balance-Leahy Scale: Normal     Standing balance support: No upper extremity supported;During functional activity Standing balance-Leahy Scale: Good               High  level balance activites: Turns;Direction changes High Level Balance Comments: Pt able to perform 360 degree turn without difficulty             Pertinent Vitals/Pain Pain Assessment: No/denies pain    Home Living Family/patient expects to be discharged to:: Private residence Living Arrangements: Spouse/significant other Available Help at Discharge: Family Type of Home: House       Home Layout: Two level Home Equipment: None Additional Comments: Pt in process of moving to 1 level condo    Prior Function Level of Independence: Independent         Comments: Reports that he sits down more now for activities when he can     Hand Dominance        Extremity/Trunk Assessment   Upper Extremity Assessment Upper Extremity Assessment: Defer to OT evaluation    Lower Extremity Assessment Lower Extremity Assessment: Overall WFL for tasks assessed       Communication   Communication: No difficulties  Cognition Arousal/Alertness: Awake/alert Behavior During Therapy: WFL for tasks assessed/performed Overall Cognitive Status: Within Functional Limits for tasks assessed                                        General Comments      Exercises     Assessment/Plan    PT Assessment Patent does not need any further PT services  PT Problem List         PT Treatment Interventions      PT Goals (Current goals can  be found in the Care Plan section)  Acute Rehab PT Goals PT Goal Formulation: All assessment and education complete, DC therapy    Frequency     Barriers to discharge        Co-evaluation               AM-PAC PT "6 Clicks" Daily Activity  Outcome Measure Difficulty turning over in bed (including adjusting bedclothes, sheets and blankets)?: None Difficulty moving from lying on back to sitting on the side of the bed? : A Little Difficulty sitting down on and standing up from a chair with arms (e.g., wheelchair, bedside commode,  etc,.)?: None Help needed moving to and from a bed to chair (including a wheelchair)?: None Help needed walking in hospital room?: None Help needed climbing 3-5 steps with a railing? : None 6 Click Score: 23    End of Session   Activity Tolerance: Patient tolerated treatment well Patient left: in bed;with call bell/phone within reach Nurse Communication: Mobility status PT Visit Diagnosis: Unsteadiness on feet (R26.81)    Time: 2706-2376 PT Time Calculation (min) (ACUTE ONLY): 14 min   Charges:   PT Evaluation $PT Eval Low Complexity: 1 Low     PT G CodesMarland Kitchen        [facility] PT [place] 02/17/2018, 4:21 PM

## 2018-02-17 NOTE — ED Notes (Signed)
ED Provider at bedside. 

## 2018-02-17 NOTE — Progress Notes (Signed)
Carotid duplex prelim: 1-39% ICA stenosis.  Jordan [name], RDMS, RVT   

## 2018-02-18 DIAGNOSIS — I6340 Cerebral infarction due to embolism of unspecified cerebral artery: Secondary | ICD-10-CM | POA: Diagnosis not present

## 2018-02-18 DIAGNOSIS — G459 Transient cerebral ischemic attack, unspecified: Secondary | ICD-10-CM | POA: Diagnosis not present

## 2018-02-18 LAB — CBC
HCT: 39.7 % (ref 39.0–52.0)
HEMOGLOBIN: 13.4 g/dL (ref 13.0–17.0)
MCH: 28.1 pg (ref 26.0–34.0)
MCHC: 33.8 g/dL (ref 30.0–36.0)
MCV: 83.2 fL (ref 78.0–100.0)
Platelets: 178 10*3/uL (ref 150–400)
RBC: 4.77 MIL/uL (ref 4.22–5.81)
RDW: 14.3 % (ref 11.5–15.5)
WBC: 7.7 10*3/uL (ref 4.0–10.5)

## 2018-02-18 LAB — BASIC METABOLIC PANEL
Anion gap: 9 (ref 5–15)
BUN: 18 mg/dL (ref 6–20)
CHLORIDE: 106 mmol/L (ref 101–111)
CO2: 21 mmol/L — ABNORMAL LOW (ref 22–32)
CREATININE: 1.03 mg/dL (ref 0.61–1.24)
Calcium: 8.8 mg/dL — ABNORMAL LOW (ref 8.9–10.3)
GFR calc Af Amer: >60 mL/min (ref >60)
GFR calc non Af Amer: >60 mL/min (ref >60)
Glucose, Bld: 120 mg/dL — ABNORMAL HIGH (ref 65–99)
Potassium: 3.6 mmol/L (ref 3.5–5.1)
SODIUM: 136 mmol/L (ref 135–145)

## 2018-02-18 LAB — MAGNESIUM: MAGNESIUM: 1.2 mg/dL — AB (ref 1.7–2.4)

## 2018-02-18 MED ORDER — ASPIRIN 81 MG PO TBEC: 81.0000 mg | DELAYED_RELEASE_TABLET | Freq: Every day | ORAL | 0 refills | Status: DC

## 2018-02-18 MED ORDER — MAGNESIUM SULFATE 4 GM/100ML IV SOLN
4.0000 g | Freq: Once | INTRAVENOUS | Status: AC
  Administered 2018-02-18: 4 g via INTRAVENOUS
  Filled 2018-02-18: qty 100

## 2018-02-18 MED ORDER — CLOPIDOGREL BISULFATE 75 MG PO TABS
75.0000 mg | ORAL_TABLET | Freq: Every day | ORAL | Status: DC
  Administered 2018-02-18: 75 mg via ORAL
  Filled 2018-02-18: qty 1

## 2018-02-18 MED ORDER — ASPIRIN EC 81 MG PO TBEC: 81.0000 mg | DELAYED_RELEASE_TABLET | Freq: Every day | ORAL | Status: DC

## 2018-02-18 MED ORDER — CLOPIDOGREL BISULFATE 75 MG PO TABS: 75.0000 mg | ORAL_TABLET | Freq: Every day | ORAL | 1 refills | Status: DC

## 2018-02-18 MED ORDER — SENNOSIDES-DOCUSATE SODIUM 8.6-50 MG PO TABS: 1.0000 | ORAL_TABLET | Freq: Every evening | ORAL | Status: DC | PRN

## 2018-02-18 MED ORDER — METFORMIN HCL ER (OSM) 1000 MG PO TB24: 2000.0000 mg | ORAL_TABLET | Freq: Every day | ORAL | 0 refills | Status: DC

## 2018-02-18 MED ORDER — AMPHETAMINE-DEXTROAMPHETAMINE 10 MG PO TABS: 10.0000 mg | ORAL_TABLET | Freq: Every day | ORAL | 0 refills | Status: DC

## 2018-02-18 MED ORDER — ADULT MULTIVITAMIN W/MINERALS CH: 1.0000 | ORAL_TABLET | Freq: Every day | ORAL | Status: DC

## 2018-02-18 NOTE — Evaluation (Signed)
Occupational Therapy Evaluation Patient Details Name: Jordan [name] MRN: [id number] DOB: February 14, 1941 Today's Date: 02/18/2018    History of Present Illness Pt presented to ED with intermittent aphasia. MRI revealed Punctate foci of acute ischemia within both frontal and parietal lobes and the right cerebellar hemisphere. PMH - HTN, DM, Pacer, arthritis, gout, back pain   Clinical Impression   Patient evaluated by Occupational Therapy with no further acute OT needs identified. All education has been completed and the patient has no further questions. See below for any follow-up Occupational Therapy or equipment needs. OT to sign off. Thank you for referral.      Follow Up Recommendations  No OT follow up    Equipment Recommendations  None recommended by OT    Recommendations for Other Services       Precautions / Restrictions Precautions Precautions: None      Mobility Bed Mobility               General bed mobility comments: in chair on arrival  Transfers Overall transfer level: Modified independent                    Balance Overall balance assessment: Modified Independent                               Standardized Balance Assessment Standardized Balance Assessment : Dynamic Gait Index   Dynamic Gait Index Level Surface: Normal Change in Gait Speed: Mild Impairment Step Over Obstacle: Mild Impairment Steps: Mild Impairment     ADL either performed or assessed with clinical judgement   ADL Overall ADL's : Modified independent                                       General ADL Comments: pt completed simulated shower step over, sink level oral care, opening tooth brush, opening juice container pouring into glass, reading from booklet in room     Vision Baseline Vision/History: Wears glasses Wears Glasses: At all times Vision Assessment?: Yes Eye Alignment: Within Functional Limits Ocular Range of Motion: Within  Functional Limits Alignment/Gaze Preference: Within Defined Limits Tracking/Visual Pursuits: Able to track stimulus in all quads without difficulty Convergence: Impaired (comment)(R with less convergence compared to the L) Additional Comments: pt able to scan evironment and able to read text 12 font     Perception     Praxis      Pertinent Vitals/Pain Pain Assessment: No/denies pain     Hand Dominance Right   Extremity/Trunk Assessment Upper Extremity Assessment Upper Extremity Assessment: Overall WFL for tasks assessed   Lower Extremity Assessment Lower Extremity Assessment: Defer to PT evaluation;LLE deficits/detail LLE Deficits / Details: reports L LE feels harder to raise with stairs    Cervical / Trunk Assessment Cervical / Trunk Assessment: Kyphotic   Communication Communication Communication: No difficulties   Cognition Arousal/Alertness: Awake/alert Behavior During Therapy: WFL for tasks assessed/performed Overall Cognitive Status: Within Functional Limits for tasks assessed                                 General Comments: w   General Comments       Exercises     Shoulder Instructions      Home Living Family/patient expects to be discharged to::  Private residence Living Arrangements: Children(child that is 61 yo with austism) Available Help at Discharge: Family Type of Home: House Home Access: Stairs to enter Technical brewer of Steps: [place]: Two level     Bathroom Shower/Tub: Occupational psychologist: [place]: None   Additional Comments: pt is process of moving to 1 level home. Wife spends time in [place] caring for elderly relatives x2 and patient cares for 5 yo son with autism.       Prior Functioning/Environment Level of Independence: Independent                 OT Problem List:        OT Treatment/Interventions:      OT Goals(Current goals can be found in the care  plan section) Acute Rehab OT Goals Patient Stated Goal: to get 3 meals a day while in acute ( reports difficulty getting meals on time)  OT Frequency:     Barriers to D/C: Decreased caregiver support          Co-evaluation              AM-PAC PT "6 Clicks" Daily Activity     Outcome Measure Help from another person eating meals?: None Help from another person taking care of personal grooming?: None Help from another person toileting, which includes using toliet, bedpan, or urinal?: None Help from another person bathing (including washing, rinsing, drying)?: None Help from another person to put on and taking off regular upper body clothing?: None Help from another person to put on and taking off regular lower body clothing?: None 6 Click Score: 24   End of Session Nurse Communication: Mobility status  Activity Tolerance: Patient tolerated treatment well Patient left: in chair;with call bell/phone within reach;Other (comment)(IV team present at end of sesison)  OT Visit Diagnosis: Unsteadiness on feet (R26.81)                Time: 8250-5397 OT Time Calculation (min): 20 min Charges:  OT General Charges $OT Visit: 1 Visit OT Evaluation $OT Eval Moderate Complexity: 1 Mod G-Codes:      Jeri Modena   OTR/L Pager: [phone] Office: [phone] .   Parke Poisson B 02/18/2018, 8:39 AM

## 2018-02-18 NOTE — Progress Notes (Signed)
Contacted by primary team about device interrogation. Discussed with [employer] rep, device was actually interrogated yesterday prior to MRI. Reports normal evaluation, no arrythmias detected and normal function.   Carlyle Dolly MD

## 2018-02-18 NOTE — Progress Notes (Signed)
9beats of pvcs asymptomatic

## 2018-02-18 NOTE — Discharge Summary (Signed)
Physician Discharge Summary  Jordan [name] CZY:[id number] DOB: 06-Mar-1941 DOA: 02/16/2018  PCP: [name], MD  Admit date: 02/16/2018 Discharge date: 02/18/2018  Admitted From: Home Disposition: Home Recommendations for Outpatient Follow-up:  1. Follow up with PCP in 1-2 weeks 2. Please obtain BMP/CBC in one week 3. Follow-up with [facility] neurology in 4 weeks at the stroke clinic 4. Follow-up with Dr. Crissie Sickles cardiology in 2 weeks Home Health: None Equipment/Devices none Discharge Condition: Stable CODE STATUS full code Diet recommendation: Heart healthy modified carb diet Brief/Interim Summary:77 y.o. male with medical history significant of HTN, DM2, PPM.  Patient presents to the ED for multiple episodes of recurrent neurologic symtoms over the past 48 hours.  Starting 2 days ago he had an episode where he had difficulty speaking.  He was told that speech was incoherent and inappropriate.  This lasted for about 5 min then resolved.  Yesterday he had numbness to L forearm but no weakness.  This episode lasted ~1hr before resolving.  This afternoon developed numbness to L side of face.  Presents to ED for evaluation of this.   ED Course: Facial numbness resolved.  CT head suggestive of multiple old strokes however.     Discharge Diagnoses:  Principal Problem:   TIA (transient ischemic attack) Active Problems:   Obstructive sleep apnea   Narcolepsy without cataplexy   Essential hypertension   Presence of permanent cardiac pacemaker   DM2 (diabetes mellitus, type 2) (Jordan [name])  1] CVA-patient presented with aphasia and left upper extremity weakness which was resolved upon arrival to er.no tpa was given.work up included carotid ultrasound unremarkable,Echo- EF 60 to 65%, CT head no acute abnormalities, MRI of the head showed punctate foci of acute ischemia within both frontal and parietal lobes and right cerebellar hemisphere.  His pacemaker was interrogated  yesterday 02/17/2018 before the MRI.  There were no arrhythmias detected.  MRA of the head showed no high-grade stenosis or occlusion.  His LDL was 61 and he was already on Lipitor 20 mg daily, hemoglobin A1c was 8.6 on maxed out dose of metformin 2000 mg twice a day.  Patient will be discharged on aspirin 81 mg daily with Plavix 75 mg daily for 3 weeks and then he will continue Plavix alone.  This was discussed in detail with patient.  He is to follow-up with his PCP cardiologist Dr. Lovena Le and neurology upon discharge.  His blood pressure has been stable during this hospital stay.  I have told him to not to drive until he follows up with a neurologist.  Discharge Instructions  Discharge Instructions    Ambulatory referral to Neurology   Complete by:  As directed    Follow up with stroke clinic NP ([name] Vanschaick or Cecille Rubin, if both not available, consider Zachery Dauer, or Ahern) at [facility] in about 4 weeks. Thanks.   Call MD for:  difficulty breathing, headache or visual disturbances   Complete by:  As directed    Call MD for:  persistant dizziness or light-headedness   Complete by:  As directed    Call MD for:  persistant nausea and vomiting   Complete by:  As directed    Call MD for:  severe uncontrolled pain   Complete by:  As directed    Diet - low sodium heart healthy   Complete by:  As directed    Increase activity slowly   Complete by:  As directed      Allergies as of 02/18/2018  No Known Allergies     Medication List    STOP taking these medications   zaleplon 10 MG capsule Commonly known as:  SONATA     TAKE these medications   amphetamine-dextroamphetamine 10 MG tablet Commonly known as:  ADDERALL Take 1 tablet (10 mg total) by mouth daily. Start taking on:  02/19/2018 What changed:    how much to take  additional instructions   aspirin 81 MG EC tablet Take 1 tablet (81 mg total) by mouth daily. Start taking on:  02/19/2018   atorvastatin 40 MG  tablet Commonly known as:  LIPITOR Take 20 mg by mouth daily before breakfast.   clopidogrel 75 MG tablet Commonly known as:  PLAVIX Take 1 tablet (75 mg total) by mouth daily.   glucosamine-chondroitin 500-400 MG tablet Take 2 tablets by mouth daily.   lisinopril 20 MG tablet Commonly known as:  PRINIVIL,ZESTRIL Take 1 tablet (20 mg total) by mouth daily.   metformin 1000 MG (OSM) 24 hr tablet Commonly known as:  FORTAMET Take 2 tablets (2,000 mg total) by mouth daily with breakfast. Start taking on:  02/19/2018 What changed:  medication strength   multivitamin with minerals Tabs tablet Take 1 tablet by mouth daily. Start taking on:  02/19/2018   NITROSTAT 0.4 MG SL tablet Generic drug:  nitroGLYCERIN PLACE 1 TABLET (0.4 MG TOTAL) UNDER THE TONGUE EVERY 5 (FIVE) MINUTES AS NEEDED FOR CHEST PAIN.   Omega 3 1200 MG Caps Take 7,200 mg by mouth daily.   ONE TOUCH ULTRA TEST test strip Generic drug:  glucose blood Apply 1 strip topically as directed.   senna-docusate 8.6-50 MG tablet Commonly known as:  Senokot-S Take 1 tablet by mouth at bedtime as needed for mild constipation.      Follow-up Information    [facility]. Schedule an appointment as soon as possible for a visit in 4 week(s).   Specialty:  Radiology Contact information: [address] [place] [phone]       [name], MD Follow up.   Specialty:  Family Medicine Contact information: [place] [address] [phone]        Evans Lance, MD Follow up.   Specialty:  Cardiology Contact information: [address] [place] [address] [phone]          No Known Allergies  Consultations:  Neurology   Procedures/Studies: Dg Chest 2 View  Result Date: 02/17/2018 CLINICAL DATA:  Numbness of left forearm, transient ischemic attack EXAM: CHEST - 2 VIEW  COMPARISON:  03/26/2016 FINDINGS: The heart size and mediastinal contours are within normal limits. Mild-to-moderate aortic atherosclerosis at the arch. No aneurysm. Left-sided pacemaker apparatus with right atrial and right ventricular leads. Minimal left basilar atelectasis. No acute pulmonary consolidation or pneumothorax. No pulmonary edema. Calcified granuloma at the right lung apex. The visualized skeletal structures are unremarkable. IMPRESSION: No active cardiopulmonary disease. Right upper lobe calcified granuloma, stable in appearance. Aortic atherosclerosis. Electronically Signed   By: Ashley Royalty M.D.   On: 02/17/2018 03:09   Ct Head Wo Contrast  Result Date: 02/16/2018 CLINICAL DATA:  Numbness LEFT side of face.  Symptoms for 1 month. EXAM: CT HEAD WITHOUT CONTRAST TECHNIQUE: Contiguous axial images were obtained from the base of the skull through the vertex without intravenous contrast. COMPARISON:  None. FINDINGS: Brain: No evidence for acute infarction, hemorrhage, mass lesion, hydrocephalus, or extra-axial fluid. Generalized atrophy.  Hypoattenuation of the white matter, likely small vessel disease. LEFT frontal brain substance loss, likely old trauma or infarct. 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 Vascular: Calcification of the cavernous internal carotid arteries and distal vertebral arteries consistent with cerebrovascular atherosclerotic disease. No signs of intracranial large vessel occlusion. Skull: Normal. Negative for fracture or focal lesion. Sinuses/Orbits: BILATERAL cataract extraction.  No sinus fluid. Other: None. IMPRESSION: Atrophy and small vessel disease. 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 No acute intracranial findings. Electronically Signed   By: Staci Righter M.D.   On: 02/16/2018 17:07   Mr Jordan [name] Head Wo Contrast  Result Date: 02/17/2018 CLINICAL DATA:  Recent difficulty speaking during an episode 2 days ago. This  episode feature incoherent and inappropriate speech that lasted for approximately 5 minutes and resolved spontaneously. Yesterday, he experienced left upper extremity numbness for approximately 1 hour. Today, left facial numbness. EXAM: MRI HEAD WITHOUT CONTRAST MRA HEAD WITHOUT CONTRAST TECHNIQUE: Multiplanar, multiecho pulse sequences of the brain and surrounding structures were obtained without intravenous contrast. Angiographic images of the head were obtained using MRA technique without contrast. COMPARISON:  Head CT 02/16/2018 FINDINGS: MRI HEAD FINDINGS Brain: The midline structures are normal. There are multiple punctate foci of abnormal diffusion restriction located within the frontal and parietal lobes and within the right cerebellum. There is no associated mass effect or acute hemorrhage. The lesions occupy multiple vascular territories. There is early confluent periventricular white matter hyperintensity consistent with chronic microvascular ischemia. There is an old left frontal lobe cortical infarct and old left corona radiata lacunar infarct. There are multiple old, small cerebellar infarcts. No mass lesion. There is mild generalized atrophy without lobar predilection. No hydrocephalus, age advanced atrophy or lobar predominant volume loss. No dural abnormality or extra-axial collection. Skull and upper cervical spine: The visualized skull base, calvarium, upper cervical spine and extracranial soft tissues are normal. Sinuses/Orbits: No fluid levels or advanced mucosal thickening. No mastoid effusion. Normal orbits. MRA HEAD FINDINGS Intracranial internal carotid arteries: Normal. Anterior cerebral arteries: Normal. Middle cerebral arteries: Mild irregularity in the right MCA on the maximum intensity projections is difficult to confirm on the source data images. Normal left MCA. No hemodynamically significant stenosis. Posterior communicating arteries: Absent Posterior cerebral arteries: Normal.  Basilar artery: Normal. Vertebral arteries: Left dominant. Normal. Superior cerebellar arteries: Normal. Anterior inferior cerebellar arteries: Normal left. Not clearly visualized on the right. Posterior inferior cerebellar arteries: Normal. IMPRESSION: 1. Punctate foci of acute ischemia within both frontal and parietal lobes and the right cerebellar hemisphere. No associated hemorrhage or mass effect. 2. The distribution of ischemic lesions involves multiple vascular territories and is suggestive of a central cardiac or proximal aortic embolic source. CTA of the neck may be helpful for further assessment. 3. Mild right middle cerebral artery M2 segment irregularity may indicate mild atherosclerotic disease, though the source data images suggest that this might be artifactual. No occlusion or hemodynamically significant stenosis within the intracranial circulation. 4. Multiple old infarcts, including left frontal cortical infarct, and findings of chronic ischemic microangiopathy. Electronically Signed   By: Ulyses Jarred M.D.   On: 02/17/2018 14:12   Mr Brain Wo Contrast  Result Date: 02/17/2018 CLINICAL DATA:  Recent difficulty speaking during an episode 2 days ago. This episode feature incoherent and inappropriate speech that lasted for approximately 5 minutes and resolved spontaneously. Yesterday, he experienced left upper extremity numbness for approximately 1 hour. Today, left facial numbness. EXAM: MRI HEAD WITHOUT CONTRAST  MRA HEAD WITHOUT CONTRAST TECHNIQUE: Multiplanar, multiecho pulse sequences of the brain and surrounding structures were obtained without intravenous contrast. Angiographic images of the head were obtained using MRA technique without contrast. COMPARISON:  Head CT 02/16/2018 FINDINGS: MRI HEAD FINDINGS Brain: The midline structures are normal. There are multiple punctate foci of abnormal diffusion restriction located within the frontal and parietal lobes and within the right cerebellum.  There is no associated mass effect or acute hemorrhage. The lesions occupy multiple vascular territories. There is early confluent periventricular white matter hyperintensity consistent with chronic microvascular ischemia. There is an old left frontal lobe cortical infarct and old left corona radiata lacunar infarct. There are multiple old, small cerebellar infarcts. No mass lesion. There is mild generalized atrophy without lobar predilection. No hydrocephalus, age advanced atrophy or lobar predominant volume loss. No dural abnormality or extra-axial collection. Skull and upper cervical spine: The visualized skull base, calvarium, upper cervical spine and extracranial soft tissues are normal. Sinuses/Orbits: No fluid levels or advanced mucosal thickening. No mastoid effusion. Normal orbits. MRA HEAD FINDINGS Intracranial internal carotid arteries: Normal. Anterior cerebral arteries: Normal. Middle cerebral arteries: Mild irregularity in the right MCA on the maximum intensity projections is difficult to confirm on the source data images. Normal left MCA. No hemodynamically significant stenosis. Posterior communicating arteries: Absent Posterior cerebral arteries: Normal. Basilar artery: Normal. Vertebral arteries: Left dominant. Normal. Superior cerebellar arteries: Normal. Anterior inferior cerebellar arteries: Normal left. Not clearly visualized on the right. Posterior inferior cerebellar arteries: Normal. IMPRESSION: 1. Punctate foci of acute ischemia within both frontal and parietal lobes and the right cerebellar hemisphere. No associated hemorrhage or mass effect. 2. The distribution of ischemic lesions involves multiple vascular territories and is suggestive of a central cardiac or proximal aortic embolic source. CTA of the neck may be helpful for further assessment. 3. Mild right middle cerebral artery M2 segment irregularity may indicate mild atherosclerotic disease, though the source data images suggest that  this might be artifactual. No occlusion or hemodynamically significant stenosis within the intracranial circulation. 4. Multiple old infarcts, including left frontal cortical infarct, and findings of chronic ischemic microangiopathy. Electronically Signed   By: Ulyses Jarred M.D.   On: 02/17/2018 14:12    (Echo, Carotid, EGD, Colonoscopy, ERCP)    Subjective:   Discharge Exam: Vitals:   02/18/18 0921 02/18/18 1337  BP: 130/72 122/83  Pulse:  69  Resp:  20  Temp:  98.1 F (36.7 C)  SpO2:  93%   Vitals:   02/18/18 0108 02/18/18 0531 02/18/18 0921 02/18/18 1337  BP: (!) 149/86 127/62 130/72 122/83  Pulse: 66 60  69  Resp: 16 16  20   Temp: 97.7 F (36.5 C) 97.7 F (36.5 C)  98.1 F (36.7 C)  TempSrc: Oral Oral  Oral  SpO2: 94%   93%  Weight:      Height:        General: Pt is alert, awake, not in acute distress Cardiovascular: RRR, S1/S2 +, no rubs, no gallops Respiratory: CTA bilaterally, no wheezing, no rhonchi Abdominal: Soft, NT, ND, bowel sounds + Extremities: no edema, no cyanosis    The results of significant diagnostics from this hospitalization (including imaging, microbiology, ancillary and laboratory) are listed below for reference.     Microbiology: No results found for this or any previous visit (from the past 240 hour(s)).   Labs: BNP (last 3 results) No results for input(s): BNP in the last 8760 hours. Basic Metabolic Panel: Recent Labs  Lab  02/16/18 1613 02/16/18 1620 02/18/18 0711  NA 141 143 136  K 3.9 3.8 3.6  CL 108 108 106  CO2 22  --  21*  GLUCOSE 175* 174* 120*  BUN 20 23* 18  CREATININE 1.46* 1.40* 1.03  CALCIUM 9.2  --  8.8*  MG  --   --  1.2*   Liver Function Tests: Recent Labs  Lab 02/16/18 1613  AST 36  ALT 28  ALKPHOS 64  BILITOT 0.6  PROT 6.9  ALBUMIN 3.6   No results for input(s): LIPASE, AMYLASE in the last 168 hours. No results for input(s): AMMONIA in the last 168 hours. CBC: Recent Labs  Lab  02/16/18 1613 02/16/18 1620 02/18/18 0711  WBC 8.3  --  7.7  NEUTROABS 4.0  --   --   HGB 13.8 13.6 13.4  HCT 41.0 40.0 39.7  MCV 84.2  --  83.2  PLT 211  --  178   Cardiac Enzymes: No results for input(s): CKTOTAL, CKMB, CKMBINDEX, TROPONINI in the last 168 hours. BNP: Invalid input(s): POCBNP CBG: Recent Labs  Lab 02/17/18 1600 02/17/18 2101  GLUCAP 114* 204*   D-Dimer No results for input(s): DDIMER in the last 72 hours. Hgb A1c Recent Labs    02/17/18 0232  HGBA1C 8.6*   Lipid Profile Recent Labs    02/17/18 0232  CHOL 119  HDL 36*  LDLCALC 61  TRIG 108  CHOLHDL 3.3   Thyroid function studies No results for input(s): TSH, T4TOTAL, T3FREE, THYROIDAB in the last 72 hours.  Invalid input(s): FREET3 Anemia work up No results for input(s): VITAMINB12, FOLATE, FERRITIN, TIBC, IRON, RETICCTPCT in the last 72 hours. Urinalysis No results found for: COLORURINE, APPEARANCEUR, LABSPEC, [place], GLUCOSEU, HGBUR, BILIRUBINUR, KETONESUR, PROTEINUR, UROBILINOGEN, NITRITE, LEUKOCYTESUR Sepsis Labs Invalid input(s): PROCALCITONIN,  WBC,  LACTICIDVEN Microbiology No results found for this or any previous visit (from the past 240 hour(s)).   Time coordinating discharge: Over 30 minutes  SIGNED:   Georgette Shell, MD  [facility] 02/18/2018, 1:58 PM Pager   If 7PM-7AM, please contact night-coverage [email or web address] Password [email or web address]

## 2018-02-18 NOTE — Progress Notes (Signed)
STROKE TEAM PROGRESS NOTE   SUBJECTIVE (INTERVAL HISTORY) No family is at the bedside.  Pt had 3 episodes of different presentation, but anyways, his MRI showed embolic stroke pattern. So far stroke work up unrevealing. Will need pacemaker interrogation to rule out afib. Recommend DAPT for 3 weeks.    OBJECTIVE Temp:  [97.7 F (36.5 C)-98.1 F (36.7 C)] 97.7 F (36.5 C) (04/20 0531) Pulse Rate:  [60-79] 60 (04/20 0531) Cardiac Rhythm: Ventricular paced (04/20 0757) Resp:  [16-18] 16 (04/20 0531) BP: (127-154)/(62-92) 130/72 (04/20 0921) SpO2:  [94 %-100 %] 94 % (04/20 0108) Weight:  [234 lb 2.1 oz (106.2 kg)] 234 lb 2.1 oz (106.2 kg) (04/19 1854)  CBC:  Recent Labs  Lab 02/16/18 1613 02/16/18 1620 02/18/18 0711  WBC 8.3  --  7.7  NEUTROABS 4.0  --   --   HGB 13.8 13.6 13.4  HCT 41.0 40.0 39.7  MCV 84.2  --  83.2  PLT 211  --  626    Basic Metabolic Panel:  Recent Labs  Lab 02/16/18 1613 02/16/18 1620 02/18/18 0711  NA 141 143 136  K 3.9 3.8 3.6  CL 108 108 106  CO2 22  --  21*  GLUCOSE 175* 174* 120*  BUN 20 23* 18  CREATININE 1.46* 1.40* 1.03  CALCIUM 9.2  --  8.8*  MG  --   --  1.2*    Lipid Panel:     Component Value Date/Time   CHOL 119 02/17/2018 0232   TRIG 108 02/17/2018 0232   HDL 36 (L) 02/17/2018 0232   CHOLHDL 3.3 02/17/2018 0232   VLDL 22 02/17/2018 0232   LDLCALC 61 02/17/2018 0232   HgbA1c:  Lab Results  Component Value Date   HGBA1C 8.6 (H) 02/17/2018   Urine Drug Screen: No results found for: LABOPIA, COCAINSCRNUR, LABBENZ, AMPHETMU, THCU, LABBARB  Alcohol Level No results found for: [place] I have personally reviewed the radiological images below and agree with the radiology interpretations.  Dg Chest 2 View 02/17/2018 IMPRESSION:  No active cardiopulmonary disease. Right upper lobe calcified granuloma, stable in appearance. Aortic atherosclerosis.   Ct Head Wo Contrast 02/16/2018 IMPRESSION:  Atrophy and small vessel  disease. 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 No acute intracranial findings.   [name] Head Wo Contrast 02/17/2018 IMPRESSION:  1. Punctate foci of acute ischemia within both frontal and parietal lobes and the right cerebellar hemisphere. No associated hemorrhage or mass effect.  2. The distribution of ischemic lesions involves multiple vascular territories and is suggestive of a central cardiac or proximal aortic embolic source. CTA of the neck may be helpful for further assessment.  3. Mild right middle cerebral artery M2 segment irregularity may indicate mild atherosclerotic disease, though the source data images suggest that this might be artifactual. No occlusion or hemodynamically significant stenosis within the intracranial circulation.  4. Multiple old infarcts, including left frontal cortical infarct, and findings of chronic ischemic microangiopathy.   Transthoracic Echocardiogram - pending - Left ventricle: The cavity size was normal. Wall thickness was   increased in a pattern of mild LVH. Systolic function was normal.   The estimated ejection fraction was in the range of 60% to 65%.   Wall motion was normal; there were no regional wall motion   abnormalities. Doppler parameters are consistent with abnormal   left ventricular relaxation (grade 1 diastolic dysfunction).   Doppler parameters are consistent with elevated mean left atrial   filling pressure. -  Left atrium: The atrium was mildly dilated.  Bilateral Carotid Dopplers  02/17/2018 Impression Right Carotid: Velocities in the right ICA are consistent with a 1-39% stenosis. Left Carotid: Velocities in the left ICA are consistent with a 1-39% stenosis. Vertebrals: Bilateral vertebral arteries demonstrate antegrade flow.    PHYSICAL EXAM Vitals:   02/17/18 2054 02/18/18 0108 02/18/18 0531 02/18/18 0921  BP: (!) 154/92 (!) 149/86 127/62 130/72  Pulse: 68 66 60   Resp: 16 16 16    Temp: 98.1 F (36.7 C) 97.7  F (36.5 C) 97.7 F (36.5 C)   TempSrc: Oral Oral Oral   SpO2: 95% 94%    Weight:      Height:       Obese middle aged Caucasian male not in distress. . Afebrile. Head is nontraumatic. Neck is supple without bruit.    Cardiac exam no murmur or gallop. Lungs are clear to auscultation. Distal pulses are well felt.  Neurological Exam ;  Awake  Alert oriented x 3. Normal speech and language.eye movements full without nystagmus.fundi were not visualized. Vision acuity and fields appear normal. Hearing is normal. Palatal movements are normal. Face symmetric. Tongue midline. Normal strength, tone, reflexes and coordination. Normal sensation. Gait deferred.   ASSESSMENT/PLAN [name] is a 77 y.o. male with history of coronary artery disease, previous strokes by imaging, diabetes mellitus, permanent cardiac pacemaker, obstructive sleep apnea on C Pap, narcolepsy, hypertension, and hyperlipidemia, presenting with speech difficulties, with left upper extremity numbness and weakness. He did not receive IV t-PA due to resolution of deficits.  Strokes:  Multifocal punctate infarcts concerning for embolic pattern - pacemaker interrogation pending    resultant  resolved deficits  CT head - no acute abnormalities  MRI head - Punctate foci of acute ischemia within both frontal and parietal lobes and the right cerebellar hemisphere  MRA head - no high-grade stenosis or occlusion  Carotid Doppler - unremarkable  2D Echo - EF 60-65%  Need to rule out afib with pacemaker interrogation. Ideally can be done this admission. If not able, please arrange to follow up with his cardiologist Dr. Brynda Peon in 1-2 weeks for pacer interrogation.   LDL - 61  HgbA1c - 8.6  VTE prophylaxis - Lovenox Fall precautions Diet heart healthy/carb modified Room service appropriate? Yes; Fluid consistency: Thin  No antithrombotic prior to admission, now on aspirin 81 mg daily with Plavix 75 mg daily. Continue  DAPT for 3 weeks and then plavix alone  Patient counseled to be compliant with his antithrombotic medications  Ongoing aggressive stroke risk factor management  Therapy recommendations:  none  Disposition: home  Hypertension  Stable . Long-term BP goal normotensive  Hyperlipidemia  Lipid lowering medication PTA:  Lipitor 20 mg daily  LDL 61, goal < 70  Current lipid lowering medication: Lipitor 20 mg daily  Continue statin at discharge  Diabetes  HgbA1c 8.6, goal < 7.0  Uncontrolled  SSI  CBG monitoring  Follow up with PCP closely and better DM control  Other Stroke Risk Factors  Advanced age  Former cigarette smoker - quit  ETOH use, advised to drink no more than 1 alcoholic beverage per day.  Obesity, Body mass index is 34.57 kg/m., recommend weight loss, diet and exercise as appropriate   Hx stroke/TIA  - previous infarcts by imaging  Coronary artery disease  Obstructive sleep apnea with CPAP  Other Active Problems  Pacemaker present  Neurology will sign off. Please call with questions. Pt will follow  up with stroke clinic NP at [facility] in about 4 weeks. Thanks for the consult.  Rosalin Hawking, MD PhD Stroke Neurology 02/18/2018 12:53 PM    To contact Stroke Continuity provider, please refer to [email or web address] After hours, contact General Neurology

## 2018-02-18 NOTE — Progress Notes (Signed)
Jordan [name] to be D/C'd Home per MD order.  Discussed with the patient and all questions fully answered.  VSS, Skin clean, dry and intact without evidence of skin break down, no evidence of skin tears noted. IV catheter discontinued intact. Site without signs and symptoms of complications. Dressing and pressure applied.  An After Visit Summary was printed and given to the patient. Patient received prescription.  D/c education completed with patient/family including follow up instructions, medication list, d/c activities limitations if indicated, with other d/c instructions as indicated by MD - patient able to verbalize understanding, all questions fully answered.   Patient instructed to return to ED, call 911, or call MD for any changes in condition.   Patient escorted via [place], and D/C home via private auto.  Richardean Chimera 02/18/2018 5:03 PM

## 2018-02-20 ENCOUNTER — Telehealth: Payer: Self-pay | Admitting: Interventional Cardiology

## 2018-02-20 NOTE — Telephone Encounter (Signed)
Spoke with pt and he was recently released from hospital after CVA.  Pt states d/c papers said for him to contact the office.  Per hospital s/c pt needs to f/u with Dr. Lovena Le in 2 weeks.  Advised pt I will send message to Dr. Tanna Furry scheduler and have her contact pt with appt.  Pt appreciative for call.

## 2018-02-20 NOTE — Telephone Encounter (Signed)
New message  Pt verbalzied that he is calling for RN  He has been in the hospital at [facility] since 02/16/2018 he was just released on 02/19/2018  Please look over his information and give him a call to see what needs to be done

## 2018-03-01 DIAGNOSIS — E119 Type 2 diabetes mellitus without complications: Secondary | ICD-10-CM | POA: Diagnosis not present

## 2018-03-01 DIAGNOSIS — G4730 Sleep apnea, unspecified: Secondary | ICD-10-CM | POA: Diagnosis not present

## 2018-03-01 DIAGNOSIS — Z950 Presence of cardiac pacemaker: Secondary | ICD-10-CM | POA: Diagnosis not present

## 2018-03-01 DIAGNOSIS — G459 Transient cerebral ischemic attack, unspecified: Secondary | ICD-10-CM | POA: Diagnosis not present

## 2018-03-01 DIAGNOSIS — E7800 Pure hypercholesterolemia, unspecified: Secondary | ICD-10-CM | POA: Diagnosis not present

## 2018-03-01 DIAGNOSIS — G47419 Narcolepsy without cataplexy: Secondary | ICD-10-CM | POA: Diagnosis not present

## 2018-03-01 DIAGNOSIS — I10 Essential (primary) hypertension: Secondary | ICD-10-CM | POA: Diagnosis not present

## 2018-03-01 DIAGNOSIS — Z7984 Long term (current) use of oral hypoglycemic drugs: Secondary | ICD-10-CM | POA: Diagnosis not present

## 2018-03-13 ENCOUNTER — Telehealth: Payer: Self-pay | Admitting: *Deleted

## 2018-03-13 NOTE — Telephone Encounter (Signed)
Called patient bc his home monitor has been disconnected for 22days.  Patient states that his monitor's plugged into a power strip where he also has his CPAP and cell phone charger plugged up. He said he will check the power strip to see if it's unplugged. Will call patient if problem persists.

## 2018-03-14 ENCOUNTER — Encounter: Payer: Self-pay | Admitting: Adult Health

## 2018-03-14 ENCOUNTER — Ambulatory Visit: Payer: Medicare HMO | Admitting: Adult Health

## 2018-03-14 VITALS — BP 142/88 | HR 68 | Ht 67.0 in | Wt 232.8 lb

## 2018-03-14 DIAGNOSIS — I10 Essential (primary) hypertension: Secondary | ICD-10-CM

## 2018-03-14 DIAGNOSIS — I6349 Cerebral infarction due to embolism of other cerebral artery: Secondary | ICD-10-CM

## 2018-03-14 DIAGNOSIS — E785 Hyperlipidemia, unspecified: Secondary | ICD-10-CM

## 2018-03-14 NOTE — Patient Instructions (Signed)
Continue clopidogrel 75 mg daily  and lipitor  for secondary stroke prevention  Stop aspirin and continue plavix alone  Continue to follow up with PCP regarding cholesterol and blood pressure management   Continue to monitor blood pressure at home  Maintain strict control of hypertension with blood pressure goal below 130/90, diabetes with hemoglobin A1c goal below 6.5% and cholesterol with LDL cholesterol (bad cholesterol) goal below 70 mg/dL. I also advised the patient to eat a healthy diet with plenty of whole grains, cereals, fruits and vegetables, exercise regularly and maintain ideal body weight.  Followup in the future with me in 4 months or call earlier if needed       Thank you for coming to see [place] at [facility]. I hope we have been able to provide you high quality care today.  You may receive a patient satisfaction survey over the next few weeks. We would appreciate your feedback and comments so that we may continue to improve ourselves and the health of our patients.

## 2018-03-14 NOTE — Progress Notes (Signed)
[facility] [address] [place]. [place] [address] [phone]       OFFICE FOLLOW UP NOTE  Mr. [name] Date of Birth:  [birth year] Medical Record Number:  [id number]   Reason for Referral:  hospital stroke follow up  CHIEF COMPLAINT:  Chief Complaint  Patient presents with  . Follow-up    Stroke follow up, Pt saw Dr.Xu in hospital pt is alone    HPI: Jordan [name] is being seen today for initial visit in the office for frontal, parietal and right cerebellar hemisphere infarcts on 02/16/18. History obtained from patient and chart review. Reviewed all radiology images and labs personally.  [name] is a 77 y.o. male with history of coronary artery disease, previous strokes by imaging, diabetes mellitus, permanent cardiac pacemaker, obstructive sleep apnea on C Pap, narcolepsy, hypertension, and hyperlipidemia, presenting with speech difficulties, with left upper extremity numbness and weakness. He did not receive IV t-PA due to resolution of deficits.  CT head reviewed and showed no acute abnormalities.  MRI head reviewed and showed punctate foci of acute ischemia within both frontal and parietal lobes in the right cerebellar hemisphere.  MRA head negative for high-grade stenosis or occlusion.  Carotid Dopplers were unremarkable.  2D echo showed an EF of 60 to 65%.  Patient does have a pacemaker and it was recommended to interrogate pacemaker to rule out A. fib.  Per cardiology, the pacemaker reported normal evaluation without arrhythmias detected and normal function.  LDL 61 and A1c 8.6.  Patient was previously on Lipitor 20 mg PTA and recommended continue this at discharge.  Recommended close PCP follow-up and monitoring for uncontrolled diabetes.  Patient was not on antithrombotic PTA and recommended DAPT with aspirin and Plavix for 3 weeks and then Plavix alone. Patient was discharged home in stable condition.  Since discharge, patient has been doing  well.  All symptoms have resolved and patient has returned to all previous activities.  He is still currently on DAPT with Plavix and aspirin with mild bruising but no bleeding.  Continues to take Lipitor without side effects of myalgias.  Primary care doctor has been managing diabetes and A1c level.  Today's blood pressure 144/88 but has recently been changing antihypertensives as blood pressure recently hypotensive.  Patient questioning if he is able to fly to [place] for his granddaughter's wedding in mid July.  Patient does have a history of sleep apnea and is compliant with a CPAP machine.  Denies new or worsening stroke/TIA symptoms.   ROS:   14 system review of systems performed and negative with exception of ringing in ears, runny nose, apnea, back pain and neck pain  PMH:  Past Medical History:  Diagnosis Date  . Arthritis    R shoulder, bone spur  . Arthritis    "hands" (03/25/2016)  . Chronic lower back pain   . Coronary heart disease    Dr Pernell Dupre  . H/O cardiovascular stress test    perhaps last one was 2009  . Heart murmur    12/29/11 echo: mild MR, no AS, trivial TR  . History of gout   . Hyperlipidemia   . Hypertension    saw Dr. Linard Millers last earl;y- 2014, cardiac cath. last done ?2009, blocks seen didn't require any intervention at that point.  . Narcolepsy    [name] 04-11-97; Mean Latency 1.64min, SOREM 2  . OSA on CPAP    NPSG 12-13-98 AHI 22.7  . PONV (  postoperative nausea and vomiting)   . Presence of permanent cardiac pacemaker   . Shortness of breath    with exertion   . Stroke ([place])   . Type II diabetes mellitus (HCC)     PSH:  Past Surgical History:  Procedure Laterality Date  . CARDIAC CATHETERIZATION  2009?  . EP IMPLANTABLE DEVICE N/A 03/25/2016   Procedure: Pacemaker Implant - Dual Chamber;  Surgeon: Evans Lance, MD;  Location: [place] [facility];  Service: Cardiovascular;  Laterality: N/A;  . FRACTURE SURGERY    . INSERT / REPLACE / REMOVE  PACEMAKER  03/24/2016  . LEFT AND RIGHT HEART CATHETERIZATION WITH CORONARY ANGIOGRAM N/A 06/05/2014   Procedure: LEFT AND RIGHT HEART CATHETERIZATION WITH CORONARY ANGIOGRAM;  Surgeon: Sinclair Grooms, MD;  Location: [facility];  Service: Cardiovascular;  Laterality: N/A;  . NASAL FRACTURE SURGERY    . SHOULDER ARTHROSCOPY WITH ROTATOR CUFF REPAIR AND SUBACROMIAL DECOMPRESSION Right 08/16/2013   Procedure: SHOULDER ARTHROSCOPY WITH ROTATOR CUFF REPAIR AND SUBACROMIAL DECOMPRESSION;  Surgeon: [name], MD;  Location: [facility];  Service: Orthopedics;  Laterality: Right;  Right shoulder arthroscopy rotator cuff repair, subacromial decompression.  . TONSILLECTOMY      Social History:  Social History   Socioeconomic History  . Marital status: Married    Spouse name: Not on file  . Number of children: Not on file  . Years of education: Not on file  . Highest education level: Not on file  Occupational History  . 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 Financial resource strain: Not on file  . Food insecurity:    Worry: Not on file    Inability: Not on file  . Transportation needs:    Medical: Not on file    Non-medical: Not on file  Tobacco Use  . Smoking status: Former Smoker    Packs/day: 2.50    Years: 6.00    Pack years: 15.00    Types: Cigarettes    Last attempt to quit: 06/05/1967    Years since quitting: 50.8  . Smokeless tobacco: Never Used  Substance and Sexual Activity  . Alcohol use: Yes    Comment: "not often", not regular, on occasion   . Drug use: No  . Sexual activity: Not Currently  Lifestyle  . Physical activity:    Days per week: Not on file    Minutes per session: Not on file  . Stress: Not on file  Relationships  . Social connections:    Talks on phone: Not on file    Gets together: Not on file    Attends religious service: Not on file    Active member of club or organization: Not on file     Attends meetings of clubs or organizations: Not on file    Relationship status: Not on file  . Intimate partner violence:    Fear of current or ex partner: Not on file    Emotionally abused: Not on file    Physically abused: Not on file    Forced sexual activity: Not on file  Other Topics Concern  . Not on file  Social History Narrative  . Not on file    Family History:  Family History  Problem Relation Age of Onset  . Sleep apnea Sister   . Colon cancer Sister     Medications:   Current Outpatient Medications on File Prior to Visit  Medication Sig Dispense Refill  .  amphetamine-dextroamphetamine (ADDERALL) 10 MG tablet Take 1 tablet (10 mg total) by mouth daily. (Patient taking differently: Take 10 mg by mouth daily. ) 30 tablet 0  . aspirin 81 MG EC tablet Take 1 tablet (81 mg total) by mouth daily. Take only for 21 days 21 tablet 0  . atorvastatin (LIPITOR) 40 MG tablet Take 20 mg by mouth daily before breakfast.     . clopidogrel (PLAVIX) 75 MG tablet Take 1 tablet (75 mg total) by mouth daily. 30 tablet 1  . glucosamine-chondroitin 500-400 MG tablet Take 2 tablets by mouth daily.    . metFORMIN (FORTAMET) 1000 MG (OSM) 24 hr tablet Take 2 tablets (2,000 mg total) by mouth daily with breakfast. 60 tablet 0  . metFORMIN (GLUCOPHAGE-XR) 500 MG 24 hr tablet     . Multiple Vitamin (MULTIVITAMIN WITH MINERALS) TABS tablet Take 1 tablet by mouth daily.    [name] NITROSTAT 0.4 MG SL tablet PLACE 1 TABLET (0.4 MG TOTAL) UNDER THE TONGUE EVERY 5 (FIVE) MINUTES AS NEEDED FOR CHEST PAIN. 25 tablet 5  . Omega 3 1200 MG CAPS Take 7,200 mg by mouth daily.     . ONE TOUCH ULTRA TEST test strip Apply 1 strip topically as directed.     No current facility-administered medications on file prior to visit.     Allergies:  No Known Allergies   Physical Exam  Vitals:   03/14/18 1117  BP: (!) 142/88  Pulse: 68  Weight: 232 lb 12.8 oz (105.6 kg)  Height: 5\' 7"  (1.702 m)   Body mass index is  36.46 kg/m. No exam data present  General: well developed, pleasant elderly Caucasian male, well nourished, seated, in no evident distress Head: head normocephalic and atraumatic.   Neck: supple with no carotid or supraclavicular bruits Cardiovascular: regular rate and rhythm, no murmurs Musculoskeletal: no deformity Skin:  no rash/petichiae Vascular:  Normal pulses all extremities  Neurologic Exam Mental Status: Awake and fully alert. Oriented to place and time. Recent and remote memory intact. Attention span, concentration and fund of knowledge appropriate. Mood and affect appropriate.  Cranial Nerves: Fundoscopic exam reveals sharp disc margins. Pupils equal, briskly reactive to light. Extraocular movements full without nystagmus. Visual fields full to confrontation. Hearing intact. Facial sensation intact. Face, tongue, palate moves normally and symmetrically.  Motor: Normal bulk and tone. Normal strength in all tested extremity muscles. Sensory.: intact to touch , pinprick , position and vibratory sensation.  Coordination: Rapid alternating movements normal in all extremities. Finger-to-nose and heel-to-shin performed accurately bilaterally. Gait and Station: Arises from chair without difficulty. Stance is normal. Gait demonstrates normal stride length and balance . Able to heel, toe and tandem walk without difficulty.  Reflexes: 1+ and symmetric. Toes downgoing.    NIHSS  0 Modified Rankin  0   Diagnostic Data (Labs, Imaging, Testing)  Dg Chest 2 View 02/17/2018 IMPRESSION:  No active cardiopulmonary disease. Right upper lobe calcified granuloma, stable in appearance. Aortic atherosclerosis.   Ct Head Wo Contrast 02/16/2018 IMPRESSION:  Atrophy and small vessel disease. 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 No acute intracranial findings.   [name] Head Wo Contrast 02/17/2018 IMPRESSION:  1. Punctate foci of acute ischemia within both frontal and parietal lobes  and the right cerebellar hemisphere. No associated hemorrhage or mass effect.  2. The distribution of ischemic lesions involves multiple vascular territories and is suggestive of a central cardiac or proximal aortic embolic source. CTA of the neck may be helpful for  further assessment.  3. Mild right middle cerebral artery M2 segment irregularity may indicate mild atherosclerotic disease, though the source data images suggest that this might be artifactual. No occlusion or hemodynamically significant stenosis within the intracranial circulation.  4. Multiple old infarcts, including left frontal cortical infarct, and findings of chronic ischemic microangiopathy.   Transthoracic Echocardiogram  - Left ventricle: The cavity size was normal. Wall thickness was increased in a pattern of mild LVH. Systolic function was normal. The estimated ejection fraction was in the range of 60% to 65%. Wall motion was normal; there were no regional wall motion abnormalities. Doppler parameters are consistent with abnormal left ventricular relaxation (grade 1 diastolic dysfunction). Doppler parameters are consistent with elevated mean left atrial filling pressure. - Left atrium: The atrium was mildly dilated.  Bilateral Carotid Dopplers  02/17/2018 Impression Right Carotid: Velocities in the right ICA are consistent with a 1-39% stenosis. Left Carotid: Velocities in the left ICA are consistent with a 1-39% stenosis. Vertebrals: Bilateral vertebral arteries demonstrate antegrade flow.    ASSESSMENT: DAISHAUN AYRE is a 77 y.o. year old male here with  on multifocal punctate infarcts in the frontal, parietal and right cerebella hemisphere concerning for embolic pattern. Vascular risk factors include HLD, HTN, OSA, CAD and DM.    PLAN: -Continue clopidogrel 75 mg daily  and lipitor  for secondary stroke prevention -Advised patient to stop aspirin as it was recommended for DAPT for a total of 3  weeks only patient will continue Plavix only at this time-  -F/u with PCP regarding your HLD, HTN, and DM management -continue to monitor BP at home -Advised patient that he should be safe to fly to [place] as long as at that time he is stable and he has not had any recent new or worsening stroke/TIA symptoms.  Also advised patient to ensure that there is a close medical facility to the area he will be going in case of emergencies. -Maintain strict control of hypertension with blood pressure goal below 130/90, diabetes with hemoglobin A1c goal below 6.5% and cholesterol with LDL cholesterol (bad cholesterol) goal below 70 mg/dL. I also advised the patient to eat a healthy diet with plenty of whole grains, cereals, fruits and vegetables, exercise regularly and maintain ideal body weight.  Follow up in 4 months or call earlier if needed   Greater than 50% time during this 25 minute consultation visit was spent on counseling and coordination of care about HTN, DM and HLD, discussion about risk benefit of anticoagulation and answering questions.    Venancio Poisson, AGNP-BC  [facility] [address] [place], [place] [address]  Phone [phone] Fax [phone]

## 2018-03-15 NOTE — Progress Notes (Signed)
I reviewed above note and agree with the assessment and plan. Please continue to follow up with his pacemaker interrogation. If afib found, he needs to be on anticoagulation for stroke prevention. Thanks.   Rosalin Hawking, MD PhD Stroke Neurology 03/15/2018 12:35 PM

## 2018-04-06 DIAGNOSIS — M9905 Segmental and somatic dysfunction of pelvic region: Secondary | ICD-10-CM | POA: Diagnosis not present

## 2018-04-06 DIAGNOSIS — M5136 Other intervertebral disc degeneration, lumbar region: Secondary | ICD-10-CM | POA: Diagnosis not present

## 2018-04-06 DIAGNOSIS — M9903 Segmental and somatic dysfunction of lumbar region: Secondary | ICD-10-CM | POA: Diagnosis not present

## 2018-04-06 DIAGNOSIS — M9904 Segmental and somatic dysfunction of sacral region: Secondary | ICD-10-CM | POA: Diagnosis not present

## 2018-04-13 ENCOUNTER — Ambulatory Visit (INDEPENDENT_AMBULATORY_CARE_PROVIDER_SITE_OTHER): Payer: Medicare HMO | Admitting: *Deleted

## 2018-04-13 DIAGNOSIS — R001 Bradycardia, unspecified: Secondary | ICD-10-CM | POA: Diagnosis not present

## 2018-04-13 DIAGNOSIS — I495 Sick sinus syndrome: Secondary | ICD-10-CM

## 2018-04-13 NOTE — Progress Notes (Signed)
Remote pacemaker transmission.   

## 2018-04-26 DIAGNOSIS — M79674 Pain in right toe(s): Secondary | ICD-10-CM | POA: Diagnosis not present

## 2018-04-26 DIAGNOSIS — B351 Tinea unguium: Secondary | ICD-10-CM | POA: Diagnosis not present

## 2018-04-26 DIAGNOSIS — Z79899 Other long term (current) drug therapy: Secondary | ICD-10-CM | POA: Diagnosis not present

## 2018-04-26 DIAGNOSIS — E119 Type 2 diabetes mellitus without complications: Secondary | ICD-10-CM | POA: Diagnosis not present

## 2018-04-26 DIAGNOSIS — M79675 Pain in left toe(s): Secondary | ICD-10-CM | POA: Diagnosis not present

## 2018-04-26 DIAGNOSIS — M25775 Osteophyte, left foot: Secondary | ICD-10-CM | POA: Diagnosis not present

## 2018-05-02 LAB — CUP PACEART REMOTE DEVICE CHECK
Brady Statistic AS VS Percent: 16 %
Brady Statistic RA Percent Paced: 84 %
Brady Statistic RV Percent Paced: 3 %
Implantable Lead Implant Date: 20170525
Implantable Lead Location: 753859
Implantable Lead Model: 377
Implantable Lead Model: 377
Implantable Lead Serial Number: 49470224
Lead Channel Impedance Value: 467 Ohm
Lead Channel Pacing Threshold Amplitude: 1.3 V
Lead Channel Pacing Threshold Pulse Width: 0.4 ms
Lead Channel Setting Pacing Amplitude: 2.4 V
Lead Channel Setting Pacing Pulse Width: 0.4 ms
MDC IDC LEAD IMPLANT DT: 20170525
MDC IDC LEAD LOCATION: 753860
MDC IDC LEAD SERIAL: 49485613
MDC IDC MSMT BATTERY REMAINING PERCENTAGE: 80 %
MDC IDC MSMT LEADCHNL RA PACING THRESHOLD AMPLITUDE: 0.9 V
MDC IDC MSMT LEADCHNL RA PACING THRESHOLD PULSEWIDTH: 0.4 ms
MDC IDC MSMT LEADCHNL RV IMPEDANCE VALUE: 464 Ohm
MDC IDC PG IMPLANT DT: 20170525
MDC IDC PG SERIAL: 68798589
MDC IDC SESS DTM: 20190702050439
MDC IDC SET LEADCHNL RV PACING AMPLITUDE: 2.4 V
MDC IDC STAT BRADY AP VP PERCENT: 2 %
MDC IDC STAT BRADY AP VS PERCENT: 81 %
MDC IDC STAT BRADY AS VP PERCENT: 0 %

## 2018-05-10 DIAGNOSIS — L600 Ingrowing nail: Secondary | ICD-10-CM | POA: Diagnosis not present

## 2018-05-17 ENCOUNTER — Ambulatory Visit: Payer: Medicare HMO | Admitting: Internal Medicine

## 2018-05-17 ENCOUNTER — Encounter: Payer: Self-pay | Admitting: Internal Medicine

## 2018-05-17 VITALS — BP 136/84 | HR 64 | Ht 67.0 in | Wt 235.0 lb

## 2018-05-17 DIAGNOSIS — I4589 Other specified conduction disorders: Secondary | ICD-10-CM | POA: Diagnosis not present

## 2018-05-17 DIAGNOSIS — I495 Sick sinus syndrome: Secondary | ICD-10-CM | POA: Diagnosis not present

## 2018-05-17 DIAGNOSIS — Z950 Presence of cardiac pacemaker: Secondary | ICD-10-CM | POA: Diagnosis not present

## 2018-05-17 NOTE — Patient Instructions (Signed)
Medication Instructions:  Your physician recommends that you continue on your current medications as directed. Please refer to the Current Medication list given to you today.  Labwork: None ordered.  Testing/Procedures: None ordered.  Follow-Up: Your physician wants you to follow-up in: one year with Dr. Lovena Le.   You will receive a reminder letter in the mail two months in advance. If you don't receive a letter, please call our office to schedule the follow-up appointment.  Remote monitoring is used to monitor your Pacemaker from home. This monitoring reduces the number of office visits required to check your device to one time per year. It allows [place] to keep an eye on the functioning of your device to ensure it is working properly. You are scheduled for a device check from home on 07/13/2018. You may send your transmission at any time that day. If you have a wireless device, the transmission will be sent automatically. After your physician reviews your transmission, you will receive a postcard with your next transmission date.  Any Other Special Instructions Will Be Listed Below (If Applicable).  If you need a refill on your cardiac medications before your next appointment, please call your pharmacy.

## 2018-05-17 NOTE — Progress Notes (Signed)
HPI Mr. 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 He is a pleasant 77 yo man with obesity, HTN, and DM. He was found to have very marked chronotropic incompetence and underwent PPM insertion 2 years ago. His energy level has improved but still somewhat lacking. No syncope or chest pain or sob. He notes occaisional orthostatic symptoms. He has had a TIA but no atrial fib was noted on the heart monitor.  No Known Allergies   Current Outpatient Medications  Medication Sig Dispense Refill  . amphetamine-dextroamphetamine (ADDERALL) 10 MG tablet Take 1 tablet (10 mg total) by mouth daily. (Patient taking differently: Take 10 mg by mouth daily. ) 30 tablet 0  . aspirin 81 MG EC tablet Take 1 tablet (81 mg total) by mouth daily. Take only for 21 days 21 tablet 0  . atorvastatin (LIPITOR) 40 MG tablet Take 20 mg by mouth daily before breakfast.     . clopidogrel (PLAVIX) 75 MG tablet Take 1 tablet (75 mg total) by mouth daily. 30 tablet 1  . glucosamine-chondroitin 500-400 MG tablet Take 2 tablets by mouth daily.    . metFORMIN (GLUCOPHAGE-XR) 500 MG 24 hr tablet Take 2,000 mg by mouth daily with breakfast.     . Multiple Vitamin (MULTIVITAMIN WITH MINERALS) TABS tablet Take 1 tablet by mouth daily.    [name] NITROSTAT 0.4 MG SL tablet PLACE 1 TABLET (0.4 MG TOTAL) UNDER THE TONGUE EVERY 5 (FIVE) MINUTES AS NEEDED FOR CHEST PAIN. 25 tablet 5  . Omega 3 1200 MG CAPS Take 7,200 mg by mouth daily.     . ONE TOUCH ULTRA TEST test strip Apply 1 strip topically as directed.     No current facility-administered medications for this visit.      Past Medical History:  Diagnosis Date  . Arthritis    R shoulder, bone spur  . Arthritis    "hands" (03/25/2016)  . Chronic lower back pain   . Coronary heart disease    Dr Pernell Dupre  . H/O cardiovascular stress test    perhaps last one was 2009  . Heart murmur    12/29/11 echo: mild MR, no AS, trivial  TR  . History of gout   . Hyperlipidemia   . Hypertension    saw Dr. Linard Millers last earl;y- 2014, cardiac cath. last done ?2009, blocks seen didn't require any intervention at that point.  . 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 OSA on CPAP    NPSG 12-13-98 AHI 22.7  . PONV (postoperative nausea and vomiting)   . Presence of permanent cardiac pacemaker   . Shortness of breath    with exertion   . Stroke ([place])   . Type II diabetes mellitus (HCC)     ROS:   All systems reviewed and negative except as noted in the HPI.   Past Surgical History:  Procedure Laterality Date  . CARDIAC CATHETERIZATION  2009?  . EP IMPLANTABLE DEVICE N/A 03/25/2016   Procedure: Pacemaker Implant - Dual Chamber;  Surgeon: Evans Lance, MD;  Location: [place] [facility];  Service: Cardiovascular;  Laterality: N/A;  . FRACTURE SURGERY    . INSERT / REPLACE / REMOVE PACEMAKER  03/24/2016  . LEFT AND RIGHT HEART CATHETERIZATION WITH CORONARY ANGIOGRAM N/A 06/05/2014   Procedure: LEFT AND RIGHT HEART CATHETERIZATION WITH CORONARY ANGIOGRAM;  Surgeon: Sinclair Grooms, MD;  Location: [facility] CATH  LAB;  Service: Cardiovascular;  Laterality: N/A;  . NASAL FRACTURE SURGERY    . SHOULDER ARTHROSCOPY WITH ROTATOR CUFF REPAIR AND SUBACROMIAL DECOMPRESSION Right 08/16/2013   Procedure: SHOULDER ARTHROSCOPY WITH ROTATOR CUFF REPAIR AND SUBACROMIAL DECOMPRESSION;  Surgeon: [name], MD;  Location: [place];  Service: Orthopedics;  Laterality: Right;  Right shoulder arthroscopy rotator cuff repair, subacromial decompression.  . TONSILLECTOMY       Family History  Problem Relation Age of Onset  . Sleep apnea Sister   . Colon cancer Sister      Social History   Socioeconomic History  . Marital status: Married    Spouse name: Not on file  . Number of children: Not on file  . Years of education: Not on file  . Highest education level: Not on file  Occupational History  . Occupation:  Chiropodist  . Occupation: [place] [employer]  Social Needs  . Financial resource strain: Not on file  . Food insecurity:    Worry: Not on file    Inability: Not on file  . Transportation needs:    Medical: Not on file    Non-medical: Not on file  Tobacco Use  . Smoking status: Former Smoker    Packs/day: 2.50    Years: 6.00    Pack years: 15.00    Types: Cigarettes    Last attempt to quit: 06/05/1967    Years since quitting: 50.9  . Smokeless tobacco: Never Used  Substance and Sexual Activity  . Alcohol use: Yes    Comment: "not often", not regular, on occasion   . Drug use: No  . Sexual activity: Not Currently  Lifestyle  . Physical activity:    Days per week: Not on file    Minutes per session: Not on file  . Stress: Not on file  Relationships  . Social connections:    Talks on phone: Not on file    Gets together: Not on file    Attends religious service: Not on file    Active member of club or organization: Not on file    Attends meetings of clubs or organizations: Not on file    Relationship status: Not on file  . Intimate partner violence:    Fear of current or ex partner: Not on file    Emotionally abused: Not on file    Physically abused: Not on file    Forced sexual activity: Not on file  Other Topics Concern  . Not on file  Social History Narrative  . Not on file     BP 136/84   Pulse 64   Ht 5\' 7"  (1.702 m)   Wt 235 lb (106.6 kg)   SpO2 97%   BMI 36.81 kg/m   Physical Exam:  Well appearing 77 yo man, NAD HEENT: Unremarkable Neck:  6 cm JVD, no thyromegally Lymphatics:  No adenopathy Back:  No CVA tenderness Lungs:  Clear with no wheezes HEART:  Regular rate rhythm, no murmurs, no rubs, no clicks Abd:  soft, positive bowel sounds, no organomegally, no rebound, no guarding Ext:  2 plus pulses, no edema, no cyanosis, no clubbing Skin:  No rashes no nodules Neuro:  CN II through XII intact, motor grossly intact  EKG - none  DEVICE    Normal device function.  See PaceArt for details.   Assess/Plan: 1. 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 PPM - his Biotronik DDD PM is working normally. 3. HTN -  his blood pressure is reasonably well controlled. No change in meds. He is encouraged to reduce his salt intake.  Mikle Bosworth.D

## 2018-05-18 LAB — CUP PACEART INCLINIC DEVICE CHECK
Date Time Interrogation Session: 20190717181600
Implantable Lead Implant Date: 20170525
Implantable Lead Location: 753859
Implantable Lead Location: 753860
Implantable Lead Model: 377
Implantable Lead Serial Number: 49470224
Implantable Lead Serial Number: 49485613
Lead Channel Impedance Value: 507 Ohm
Lead Channel Pacing Threshold Amplitude: 1 V
Lead Channel Pacing Threshold Pulse Width: 0.4 ms
Lead Channel Sensing Intrinsic Amplitude: 11.2 mV
Lead Channel Sensing Intrinsic Amplitude: 7.4 mV
Lead Channel Setting Pacing Pulse Width: 0.4 ms
MDC IDC LEAD IMPLANT DT: 20170525
MDC IDC MSMT LEADCHNL RA IMPEDANCE VALUE: 487 Ohm
MDC IDC MSMT LEADCHNL RV PACING THRESHOLD AMPLITUDE: 1.4 V
MDC IDC MSMT LEADCHNL RV PACING THRESHOLD PULSEWIDTH: 0.4 ms
MDC IDC PG IMPLANT DT: 20170525
MDC IDC SET LEADCHNL RA PACING AMPLITUDE: 2.4 V
MDC IDC SET LEADCHNL RV PACING AMPLITUDE: 2.4 V
MDC IDC STAT BRADY RA PERCENT PACED: 91 %
MDC IDC STAT BRADY RV PERCENT PACED: 4 %
Pulse Gen Model: 394969
Pulse Gen Serial Number: 68798589

## 2018-05-24 DIAGNOSIS — M79674 Pain in right toe(s): Secondary | ICD-10-CM | POA: Diagnosis not present

## 2018-05-24 DIAGNOSIS — B351 Tinea unguium: Secondary | ICD-10-CM | POA: Diagnosis not present

## 2018-06-13 DIAGNOSIS — I679 Cerebrovascular disease, unspecified: Secondary | ICD-10-CM | POA: Diagnosis not present

## 2018-06-13 DIAGNOSIS — I10 Essential (primary) hypertension: Secondary | ICD-10-CM | POA: Diagnosis not present

## 2018-06-13 DIAGNOSIS — E119 Type 2 diabetes mellitus without complications: Secondary | ICD-10-CM | POA: Diagnosis not present

## 2018-06-13 DIAGNOSIS — R1011 Right upper quadrant pain: Secondary | ICD-10-CM | POA: Diagnosis not present

## 2018-06-13 DIAGNOSIS — Z7984 Long term (current) use of oral hypoglycemic drugs: Secondary | ICD-10-CM | POA: Diagnosis not present

## 2018-06-13 DIAGNOSIS — E7800 Pure hypercholesterolemia, unspecified: Secondary | ICD-10-CM | POA: Diagnosis not present

## 2018-06-13 DIAGNOSIS — Z950 Presence of cardiac pacemaker: Secondary | ICD-10-CM | POA: Diagnosis not present

## 2018-06-14 ENCOUNTER — Other Ambulatory Visit: Payer: Self-pay | Admitting: Family Medicine

## 2018-06-14 DIAGNOSIS — M9903 Segmental and somatic dysfunction of lumbar region: Secondary | ICD-10-CM | POA: Diagnosis not present

## 2018-06-14 DIAGNOSIS — M5136 Other intervertebral disc degeneration, lumbar region: Secondary | ICD-10-CM | POA: Diagnosis not present

## 2018-06-14 DIAGNOSIS — M9904 Segmental and somatic dysfunction of sacral region: Secondary | ICD-10-CM | POA: Diagnosis not present

## 2018-06-14 DIAGNOSIS — R1011 Right upper quadrant pain: Secondary | ICD-10-CM

## 2018-06-14 DIAGNOSIS — M9905 Segmental and somatic dysfunction of pelvic region: Secondary | ICD-10-CM | POA: Diagnosis not present

## 2018-06-20 DIAGNOSIS — H524 Presbyopia: Secondary | ICD-10-CM | POA: Diagnosis not present

## 2018-06-20 DIAGNOSIS — E113292 Type 2 diabetes mellitus with mild nonproliferative diabetic retinopathy without macular edema, left eye: Secondary | ICD-10-CM | POA: Diagnosis not present

## 2018-06-20 DIAGNOSIS — Z961 Presence of intraocular lens: Secondary | ICD-10-CM | POA: Diagnosis not present

## 2018-06-20 DIAGNOSIS — H52223 Regular astigmatism, bilateral: Secondary | ICD-10-CM | POA: Diagnosis not present

## 2018-06-20 DIAGNOSIS — H40013 Open angle with borderline findings, low risk, bilateral: Secondary | ICD-10-CM | POA: Diagnosis not present

## 2018-06-20 DIAGNOSIS — H5212 Myopia, left eye: Secondary | ICD-10-CM | POA: Diagnosis not present

## 2018-06-21 DIAGNOSIS — M79675 Pain in left toe(s): Secondary | ICD-10-CM | POA: Diagnosis not present

## 2018-06-21 DIAGNOSIS — M79674 Pain in right toe(s): Secondary | ICD-10-CM | POA: Diagnosis not present

## 2018-06-21 DIAGNOSIS — B351 Tinea unguium: Secondary | ICD-10-CM | POA: Diagnosis not present

## 2018-06-22 ENCOUNTER — Ambulatory Visit
Admission: RE | Admit: 2018-06-22 | Discharge: 2018-06-22 | Disposition: A | Discharge status: Home / Self Care | Payer: Medicare HMO | Source: Ambulatory Visit | Attending: Family Medicine | Admitting: Family Medicine

## 2018-06-22 DIAGNOSIS — K824 Cholesterolosis of gallbladder: Secondary | ICD-10-CM | POA: Diagnosis not present

## 2018-06-22 DIAGNOSIS — H52223 Regular astigmatism, bilateral: Secondary | ICD-10-CM | POA: Diagnosis not present

## 2018-06-22 DIAGNOSIS — H524 Presbyopia: Secondary | ICD-10-CM | POA: Diagnosis not present

## 2018-06-22 DIAGNOSIS — R1011 Right upper quadrant pain: Secondary | ICD-10-CM

## 2018-07-05 ENCOUNTER — Telehealth: Payer: Self-pay | Admitting: Internal Medicine

## 2018-07-05 NOTE — Telephone Encounter (Signed)
Left message for patient stating that I would route this request over to Dr. Annamaria Boots.   Patient is requesting a refill on Adderall 10mg    Last OV: 02/01/18 Next OV: 01/2019 Last RX: 02/01/18 for 90 tablets  Dr. Annamaria Boots, please advise. Thanks!

## 2018-07-05 NOTE — Telephone Encounter (Signed)
Ok to refill 

## 2018-07-06 MED ORDER — AMPHETAMINE-DEXTROAMPHETAMINE 10 MG PO TABS: 10.0000 mg | ORAL_TABLET | Freq: Every day | ORAL | 0 refills | Status: DC

## 2018-07-06 NOTE — Telephone Encounter (Signed)
Spoke with pt, advised him RX is ready to pick up. Pt understood and nothing further is needed.

## 2018-07-06 NOTE — Telephone Encounter (Signed)
Rx printed and will await signature from CY.  

## 2018-07-13 ENCOUNTER — Ambulatory Visit (INDEPENDENT_AMBULATORY_CARE_PROVIDER_SITE_OTHER): Payer: Medicare HMO | Admitting: *Deleted

## 2018-07-13 DIAGNOSIS — I495 Sick sinus syndrome: Secondary | ICD-10-CM

## 2018-07-13 NOTE — Progress Notes (Signed)
Remote pacemaker transmission.   

## 2018-07-18 ENCOUNTER — Ambulatory Visit: Payer: Medicare HMO | Admitting: Adult Health

## 2018-07-18 ENCOUNTER — Encounter: Payer: Self-pay | Admitting: Adult Health

## 2018-07-18 VITALS — BP 96/64 | HR 76 | Ht 67.0 in | Wt 235.0 lb

## 2018-07-18 DIAGNOSIS — I10 Essential (primary) hypertension: Secondary | ICD-10-CM

## 2018-07-18 DIAGNOSIS — E1159 Type 2 diabetes mellitus with other circulatory complications: Secondary | ICD-10-CM | POA: Diagnosis not present

## 2018-07-18 DIAGNOSIS — G4733 Obstructive sleep apnea (adult) (pediatric): Secondary | ICD-10-CM | POA: Diagnosis not present

## 2018-07-18 DIAGNOSIS — G459 Transient cerebral ischemic attack, unspecified: Secondary | ICD-10-CM

## 2018-07-18 DIAGNOSIS — E785 Hyperlipidemia, unspecified: Secondary | ICD-10-CM

## 2018-07-18 NOTE — Progress Notes (Signed)
I agree with the above plan 

## 2018-07-18 NOTE — Patient Instructions (Signed)
Continue clopidogrel 75 mg daily  and lipitor  for secondary stroke prevention  Continue to follow up with PCP regarding cholesterol, blood pressure and diabetes management   Continue to wear CPAP for sleep apnea management  Continue to stay active and maintain a healthy diet  Continue to monitor blood pressure at home  Maintain strict control of hypertension with blood pressure goal below 130/90, diabetes with hemoglobin A1c goal below 6.5% and cholesterol with LDL cholesterol (bad cholesterol) goal below 70 mg/dL. I also advised the patient to eat a healthy diet with plenty of whole grains, cereals, fruits and vegetables, exercise regularly and maintain ideal body weight.  Followup in the future with me in 6 months or call earlier if needed       Thank you for coming to see [place] at [facility]. I hope we have been able to provide you high quality care today.  You may receive a patient satisfaction survey over the next few weeks. We would appreciate your feedback and comments so that we may continue to improve ourselves and the health of our patients.

## 2018-07-18 NOTE — Progress Notes (Signed)
[facility] [address] [place]. [place] [address] [phone]       OFFICE FOLLOW UP NOTE  Mr. MONTGOMERY FAVOR Date of Birth:  25-Apr-1941 Medical Record Number:  [id number]   Reason for visit: Stroke follow up  CHIEF COMPLAINT:  Chief Complaint  Patient presents with  . Follow-up    4 month follow up. Alone. Treatment room. No new changes or concerns at this time. Patient stated that he know his limitations.     HPI: FELIZ LINCOLN is being seen today in the office for frontal, parietal and right cerebellar hemisphere infarcts on 02/16/18. History obtained from patient and chart review. Reviewed all radiology images and labs personally.  Mr. [name] is a 77 y.o. male with history of coronary artery disease, previous strokes by imaging, diabetes mellitus, permanent cardiac pacemaker, obstructive sleep apnea on C Pap, narcolepsy, hypertension, and hyperlipidemia, presenting with speech difficulties, with left upper extremity numbness and weakness. He did not receive IV t-PA due to resolution of deficits.  CT head reviewed and showed no acute abnormalities.  MRI head reviewed and showed punctate foci of acute ischemia within both frontal and parietal lobes in the right cerebellar hemisphere.  MRA head negative for high-grade stenosis or occlusion.  Carotid Dopplers were unremarkable.  2D echo showed an EF of 60 to 65%.  Patient does have a pacemaker and it was recommended to interrogate pacemaker to rule out A. fib.  Per cardiology, the pacemaker reported normal evaluation without arrhythmias detected and normal function.  LDL 61 and A1c 8.6.  Patient was previously on Lipitor 20 mg PTA and recommended continue this at discharge.  Recommended close PCP follow-up and monitoring for uncontrolled diabetes.  Patient was not on antithrombotic PTA and recommended DAPT with aspirin and Plavix for 3 weeks and then Plavix alone. Patient was discharged home in stable  condition.  03/14/2018 visit: Since discharge, patient has been doing well.  All symptoms have resolved and patient has returned to all previous activities.  He is still currently on DAPT with Plavix and aspirin with mild bruising but no bleeding.  Continues to take Lipitor without side effects of myalgias.  Primary care doctor has been managing diabetes and A1c level.  Today's blood pressure 144/88 but has recently been changing antihypertensives as blood pressure recently hypotensive.  Patient questioning if he is able to fly to [place] for his granddaughter's wedding in mid July.  Patient does have a history of sleep apnea and is compliant with a CPAP machine.  Denies new or worsening stroke/TIA symptoms.  Interval history 07/18/2018: Patient is being seen today for scheduled follow up visit. Overall, patient has been doing well. Continues to take plavix without side effects of bleeding or bruising. Continues to take lipitor without side effects of myalgias. Blood pressure satisfactory 96/64. Patient continues to stay active and maintains a healthy diet. Continues to wear CPAP for OSA management. Denies new or worsening stroke/TIA symptoms.    ROS:   14 system review of systems performed and negative with exception of fatigue, eye itching, licensed, hearing loss, ringing in ears, apnea, daytime sleepiness, frequency of urination, urgency, back pain, memory loss and numbness  PMH:  Past Medical History:  Diagnosis Date  . Arthritis    R shoulder, bone spur  . Arthritis    "hands" (03/25/2016)  . Chronic lower back pain   . Coronary heart disease    Dr Pernell Dupre  . H/O cardiovascular stress test  perhaps last one was 2009  . Heart murmur    12/29/11 echo: mild MR, no AS, trivial TR  . History of gout   . Hyperlipidemia   . Hypertension    saw Dr. Linard Millers last earl;y- 2014, cardiac cath. last done ?2009, blocks seen didn't require any intervention at that point.  . Narcolepsy    [name]  04-11-97; Mean Latency 1.76min, SOREM 2  . OSA on CPAP    NPSG 12-13-98 AHI 22.7  . PONV (postoperative nausea and vomiting)   . Presence of permanent cardiac pacemaker   . Shortness of breath    with exertion   . Stroke ([place])   . Type II diabetes mellitus (HCC)     PSH:  Past Surgical History:  Procedure Laterality Date  . CARDIAC CATHETERIZATION  2009?  . EP IMPLANTABLE DEVICE N/A 03/25/2016   Procedure: Pacemaker Implant - Dual Chamber;  Surgeon: Evans Lance, MD;  Location: [place] [facility];  Service: Cardiovascular;  Laterality: N/A;  . FRACTURE SURGERY    . INSERT / REPLACE / REMOVE PACEMAKER  03/24/2016  . LEFT AND RIGHT HEART CATHETERIZATION WITH CORONARY ANGIOGRAM N/A 06/05/2014   Procedure: LEFT AND RIGHT HEART CATHETERIZATION WITH CORONARY ANGIOGRAM;  Surgeon: Sinclair Grooms, MD;  Location: [facility] CATH LAB;  Service: Cardiovascular;  Laterality: N/A;  . NASAL FRACTURE SURGERY    . SHOULDER ARTHROSCOPY WITH ROTATOR CUFF REPAIR AND SUBACROMIAL DECOMPRESSION Right 08/16/2013   Procedure: SHOULDER ARTHROSCOPY WITH ROTATOR CUFF REPAIR AND SUBACROMIAL DECOMPRESSION;  Surgeon: [name], MD;  Location: [place];  Service: Orthopedics;  Laterality: Right;  Right shoulder arthroscopy rotator cuff repair, subacromial decompression.  . TONSILLECTOMY      Social History:  Social History   Socioeconomic History  . Marital status: Married    Spouse name: Not on file  . Number of children: Not on file  . Years of education: Not on file  . Highest education level: Not on file  Occupational History  . 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 Financial resource strain: Not on file  . Food insecurity:    Worry: Not on file    Inability: Not on file  . Transportation needs:    Medical: Not on file    Non-medical: Not on file  Tobacco Use  . Smoking status: Former Smoker    Packs/day: 2.50    Years: 6.00    Pack years: 15.00     Types: Cigarettes    Last attempt to quit: 06/05/1967    Years since quitting: 51.1  . Smokeless tobacco: Never Used  Substance and Sexual Activity  . Alcohol use: Yes    Comment: "not often", not regular, on occasion   . Drug use: No  . Sexual activity: Not Currently  Lifestyle  . Physical activity:    Days per week: Not on file    Minutes per session: Not on file  . Stress: Not on file  Relationships  . Social connections:    Talks on phone: Not on file    Gets together: Not on file    Attends religious service: Not on file    Active member of club or organization: Not on file    Attends meetings of clubs or organizations: Not on file    Relationship status: Not on file  . Intimate partner violence:    Fear of current or ex partner: Not on file  Emotionally abused: Not on file    Physically abused: Not on file    Forced sexual activity: Not on file  Other Topics Concern  . Not on file  Social History Narrative  . Not on file    Family History:  Family History  Problem Relation Age of Onset  . Sleep apnea Sister   . Colon cancer Sister     Medications:   Current Outpatient Medications on File Prior to Visit  Medication Sig Dispense Refill  . amphetamine-dextroamphetamine (ADDERALL) 10 MG tablet Take 1 tablet (10 mg total) by mouth daily. 30 tablet 0  . aspirin 81 MG EC tablet Take 1 tablet (81 mg total) by mouth daily. Take only for 21 days 21 tablet 0  . atorvastatin (LIPITOR) 40 MG tablet Take 20 mg by mouth daily before breakfast.     . clopidogrel (PLAVIX) 75 MG tablet Take 1 tablet (75 mg total) by mouth daily. 30 tablet 1  . glucosamine-chondroitin 500-400 MG tablet Take 2 tablets by mouth daily.    . metFORMIN (GLUCOPHAGE-XR) 500 MG 24 hr tablet Take 2,000 mg by mouth daily with breakfast.     . Multiple Vitamin (MULTIVITAMIN WITH MINERALS) TABS tablet Take 1 tablet by mouth daily.    [name] NITROSTAT 0.4 MG SL tablet PLACE 1 TABLET (0.4 MG TOTAL) UNDER THE  TONGUE EVERY 5 (FIVE) MINUTES AS NEEDED FOR CHEST PAIN. 25 tablet 5  . Omega 3 1200 MG CAPS Take 7,200 mg by mouth daily.     . ONE TOUCH ULTRA TEST test strip Apply 1 strip topically as directed.     No current facility-administered medications on file prior to visit.     Allergies:  No Known Allergies   Physical Exam  Vitals:   07/18/18 1307  BP: 96/64  Pulse: 76  Weight: 235 lb (106.6 kg)  Height: 5\' 7"  (1.702 m)   Body mass index is 36.81 kg/m. No exam data present  General: well developed, pleasant elderly Caucasian male, well nourished, seated, in no evident distress Head: head normocephalic and atraumatic.   Neck: supple with no carotid or supraclavicular bruits Cardiovascular: regular rate and rhythm, no murmurs Musculoskeletal: no deformity Skin:  no rash/petichiae Vascular:  Normal pulses all extremities  Neurologic Exam Mental Status: Awake and fully alert. Oriented to place and time. Recent and remote memory intact. Attention span, concentration and fund of knowledge appropriate. Mood and affect appropriate.  Cranial Nerves: Fundoscopic exam reveals sharp disc margins. Pupils equal, briskly reactive to light. Extraocular movements full without nystagmus. Visual fields full to confrontation. Hearing intact. Facial sensation intact. Face, tongue, palate moves normally and symmetrically.  Motor: Normal bulk and tone. Normal strength in all tested extremity muscles. Sensory.: intact to touch , pinprick , position and vibratory sensation.  Coordination: Rapid alternating movements normal in all extremities. Finger-to-nose and heel-to-shin performed accurately bilaterally. Gait and Station: Arises from chair without difficulty. Stance is normal. Gait demonstrates normal stride length and balance . Able to heel, toe and tandem walk without difficulty.  Reflexes: 1+ and symmetric. Toes downgoing.    NIHSS  0 Modified Rankin  0   Diagnostic Data (Labs, Imaging,  Testing)  Dg Chest 2 View 02/17/2018 IMPRESSION:  No active cardiopulmonary disease. Right upper lobe calcified granuloma, stable in appearance. Aortic atherosclerosis.   Ct Head Wo Contrast 02/16/2018 IMPRESSION:  Atrophy and small vessel disease. 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 No acute intracranial findings.   Mr [facility]  Contrast 02/17/2018 IMPRESSION:  1. Punctate foci of acute ischemia within both frontal and parietal lobes and the right cerebellar hemisphere. No associated hemorrhage or mass effect.  2. The distribution of ischemic lesions involves multiple vascular territories and is suggestive of a central cardiac or proximal aortic embolic source. CTA of the neck may be helpful for further assessment.  3. Mild right middle cerebral artery M2 segment irregularity may indicate mild atherosclerotic disease, though the source data images suggest that this might be artifactual. No occlusion or hemodynamically significant stenosis within the intracranial circulation.  4. Multiple old infarcts, including left frontal cortical infarct, and findings of chronic ischemic microangiopathy.   Transthoracic Echocardiogram  - Left ventricle: The cavity size was normal. Wall thickness was increased in a pattern of mild LVH. Systolic function was normal. The estimated ejection fraction was in the range of 60% to 65%. Wall motion was normal; there were no regional wall motion abnormalities. Doppler parameters are consistent with abnormal left ventricular relaxation (grade 1 diastolic dysfunction). Doppler parameters are consistent with elevated mean left atrial filling pressure. - Left atrium: The atrium was mildly dilated.  Bilateral Carotid Dopplers  02/17/2018 Impression Right Carotid: Velocities in the right ICA are consistent with a 1-39% stenosis. Left Carotid: Velocities in the left ICA are consistent with a 1-39% stenosis. Vertebrals: Bilateral  vertebral arteries demonstrate antegrade flow.    ASSESSMENT: RETT STEHLIK is a 77 y.o. year old male here with  on multifocal punctate infarcts in the frontal, parietal and right cerebella hemisphere concerning for embolic pattern. Vascular risk factors include HLD, HTN, OSA, CAD and DM.  Patient returns for scheduled follow-up visit and overall has been doing well from a stroke standpoint without residual deficits or recurrence of symptoms.   PLAN: -Continue clopidogrel 75 mg daily  and lipitor  for secondary stroke prevention -F/u with PCP regarding your HLD, HTN, and DM management -Advised to continue to be compliant with CPAP for OSA management and highly encouraged ensuring regular cleaning of machine to avoid respiratory infection -continue to monitor BP at home -Continue to stay active and maintain a healthy diet -Maintain strict control of hypertension with blood pressure goal below 130/90, diabetes with hemoglobin A1c goal below 6.5% and cholesterol with LDL cholesterol (bad cholesterol) goal below 70 mg/dL. I also advised the patient to eat a healthy diet with plenty of whole grains, cereals, fruits and vegetables, exercise regularly and maintain ideal body weight.  Follow up in 6 months or call earlier if needed   Greater than 50% time during this 25 minute consultation visit was spent on counseling and coordination of care about HTN, DM and HLD, discussion about risk benefit of anticoagulation and answering questions.    Venancio Poisson, AGNP-BC  [facility] [address] [place], [place] [address]  Phone [phone] Fax [phone]

## 2018-07-19 DIAGNOSIS — M79674 Pain in right toe(s): Secondary | ICD-10-CM | POA: Diagnosis not present

## 2018-07-19 DIAGNOSIS — M79675 Pain in left toe(s): Secondary | ICD-10-CM | POA: Diagnosis not present

## 2018-07-19 DIAGNOSIS — B351 Tinea unguium: Secondary | ICD-10-CM | POA: Diagnosis not present

## 2018-07-20 DIAGNOSIS — M9905 Segmental and somatic dysfunction of pelvic region: Secondary | ICD-10-CM | POA: Diagnosis not present

## 2018-07-20 DIAGNOSIS — M5136 Other intervertebral disc degeneration, lumbar region: Secondary | ICD-10-CM | POA: Diagnosis not present

## 2018-07-20 DIAGNOSIS — M9903 Segmental and somatic dysfunction of lumbar region: Secondary | ICD-10-CM | POA: Diagnosis not present

## 2018-07-20 DIAGNOSIS — M9904 Segmental and somatic dysfunction of sacral region: Secondary | ICD-10-CM | POA: Diagnosis not present

## 2018-07-25 DIAGNOSIS — M9903 Segmental and somatic dysfunction of lumbar region: Secondary | ICD-10-CM | POA: Diagnosis not present

## 2018-07-25 DIAGNOSIS — M5136 Other intervertebral disc degeneration, lumbar region: Secondary | ICD-10-CM | POA: Diagnosis not present

## 2018-07-25 DIAGNOSIS — M9904 Segmental and somatic dysfunction of sacral region: Secondary | ICD-10-CM | POA: Diagnosis not present

## 2018-07-25 DIAGNOSIS — M9905 Segmental and somatic dysfunction of pelvic region: Secondary | ICD-10-CM | POA: Diagnosis not present

## 2018-08-02 LAB — CUP PACEART REMOTE DEVICE CHECK
Brady Statistic AP VS Percent: 81 %
Brady Statistic AS VP Percent: 0 %
Brady Statistic AS VS Percent: 16 %
Brady Statistic RA Percent Paced: 83 %
Date Time Interrogation Session: 20191002080406
Implantable Lead Implant Date: 20170525
Implantable Lead Implant Date: 20170525
Implantable Lead Serial Number: 49470224
Lead Channel Impedance Value: 467 Ohm
Lead Channel Pacing Threshold Amplitude: 1.3 V
Lead Channel Pacing Threshold Pulse Width: 0.4 ms
Lead Channel Pacing Threshold Pulse Width: 0.4 ms
Lead Channel Setting Pacing Amplitude: 2.4 V
MDC IDC LEAD LOCATION: 753859
MDC IDC LEAD LOCATION: 753860
MDC IDC LEAD SERIAL: 49485613
MDC IDC MSMT BATTERY REMAINING PERCENTAGE: 80 %
MDC IDC MSMT LEADCHNL RA IMPEDANCE VALUE: 527 Ohm
MDC IDC MSMT LEADCHNL RA PACING THRESHOLD AMPLITUDE: 1.1 V
MDC IDC PG IMPLANT DT: 20170525
MDC IDC SET LEADCHNL RV PACING AMPLITUDE: 2.4 V
MDC IDC SET LEADCHNL RV PACING PULSEWIDTH: 0.4 ms
MDC IDC STAT BRADY AP VP PERCENT: 3 %
MDC IDC STAT BRADY RV PERCENT PACED: 3 %
Pulse Gen Model: 394969
Pulse Gen Serial Number: 68798589

## 2018-08-04 ENCOUNTER — Telehealth: Payer: Self-pay | Admitting: Internal Medicine

## 2018-08-04 MED ORDER — AMPHETAMINE-DEXTROAMPHETAMINE 10 MG PO TABS: 10.0000 mg | ORAL_TABLET | Freq: Every day | ORAL | 0 refills | Status: DC

## 2018-08-04 NOTE — Telephone Encounter (Signed)
Ok to refill 

## 2018-08-04 NOTE — Telephone Encounter (Signed)
Refill request for Adderall  Last OV: 02/01/2018 Next OV: 02/02/2019  Last ordered by : CY Quantity: 90  CY please advise  No Known Allergies  Current Outpatient Medications on File Prior to Visit  Medication Sig Dispense Refill  . amphetamine-dextroamphetamine (ADDERALL) 10 MG tablet Take 1 tablet (10 mg total) by mouth daily. 30 tablet 0  . aspirin 81 MG EC tablet Take 1 tablet (81 mg total) by mouth daily. Take only for 21 days 21 tablet 0  . atorvastatin (LIPITOR) 40 MG tablet Take 20 mg by mouth daily before breakfast.     . clopidogrel (PLAVIX) 75 MG tablet Take 1 tablet (75 mg total) by mouth daily. 30 tablet 1  . glucosamine-chondroitin 500-400 MG tablet Take 2 tablets by mouth daily.    . metFORMIN (GLUCOPHAGE-XR) 500 MG 24 hr tablet Take 2,000 mg by mouth daily with breakfast.     . Multiple Vitamin (MULTIVITAMIN WITH MINERALS) TABS tablet Take 1 tablet by mouth daily.    [name] NITROSTAT 0.4 MG SL tablet PLACE 1 TABLET (0.4 MG TOTAL) UNDER THE TONGUE EVERY 5 (FIVE) MINUTES AS NEEDED FOR CHEST PAIN. 25 tablet 5  . Omega 3 1200 MG CAPS Take 7,200 mg by mouth daily.     . ONE TOUCH ULTRA TEST test strip Apply 1 strip topically as directed.     No current facility-administered medications on file prior to visit.

## 2018-08-04 NOTE — Telephone Encounter (Signed)
RX printed and signed by Dr. Annamaria Boots.   Attempt to call patient, unable to reach. Left detailed message for patient to pick up and to call if he had any questions.  Placed RX in envelope, and placed up front in [place] folder for pick up.  Nothing further needed.

## 2018-08-07 ENCOUNTER — Telehealth: Payer: Self-pay | Admitting: Internal Medicine

## 2018-08-07 DIAGNOSIS — M5136 Other intervertebral disc degeneration, lumbar region: Secondary | ICD-10-CM | POA: Diagnosis not present

## 2018-08-07 DIAGNOSIS — M9905 Segmental and somatic dysfunction of pelvic region: Secondary | ICD-10-CM | POA: Diagnosis not present

## 2018-08-07 DIAGNOSIS — M9904 Segmental and somatic dysfunction of sacral region: Secondary | ICD-10-CM | POA: Diagnosis not present

## 2018-08-07 DIAGNOSIS — M9903 Segmental and somatic dysfunction of lumbar region: Secondary | ICD-10-CM | POA: Diagnosis not present

## 2018-08-07 NOTE — Telephone Encounter (Signed)
Explained to patient that the wording was different but he still received 90 tabs. Patient voiced understanding. Nothing further is needed at this time.

## 2018-08-21 DIAGNOSIS — H401130 Primary open-angle glaucoma, bilateral, stage unspecified: Secondary | ICD-10-CM | POA: Diagnosis not present

## 2018-09-13 DIAGNOSIS — G4730 Sleep apnea, unspecified: Secondary | ICD-10-CM | POA: Diagnosis not present

## 2018-09-13 DIAGNOSIS — I10 Essential (primary) hypertension: Secondary | ICD-10-CM | POA: Diagnosis not present

## 2018-09-13 DIAGNOSIS — E7800 Pure hypercholesterolemia, unspecified: Secondary | ICD-10-CM | POA: Diagnosis not present

## 2018-09-13 DIAGNOSIS — G47419 Narcolepsy without cataplexy: Secondary | ICD-10-CM | POA: Diagnosis not present

## 2018-09-13 DIAGNOSIS — I679 Cerebrovascular disease, unspecified: Secondary | ICD-10-CM | POA: Diagnosis not present

## 2018-09-13 DIAGNOSIS — Z950 Presence of cardiac pacemaker: Secondary | ICD-10-CM | POA: Diagnosis not present

## 2018-09-13 DIAGNOSIS — I350 Nonrheumatic aortic (valve) stenosis: Secondary | ICD-10-CM | POA: Diagnosis not present

## 2018-09-13 DIAGNOSIS — E119 Type 2 diabetes mellitus without complications: Secondary | ICD-10-CM | POA: Diagnosis not present

## 2018-09-13 DIAGNOSIS — I2510 Atherosclerotic heart disease of native coronary artery without angina pectoris: Secondary | ICD-10-CM | POA: Diagnosis not present

## 2018-09-13 DIAGNOSIS — Z125 Encounter for screening for malignant neoplasm of prostate: Secondary | ICD-10-CM | POA: Diagnosis not present

## 2018-10-09 DIAGNOSIS — M5136 Other intervertebral disc degeneration, lumbar region: Secondary | ICD-10-CM | POA: Diagnosis not present

## 2018-10-09 DIAGNOSIS — M50322 Other cervical disc degeneration at C5-C6 level: Secondary | ICD-10-CM | POA: Diagnosis not present

## 2018-10-09 DIAGNOSIS — M9903 Segmental and somatic dysfunction of lumbar region: Secondary | ICD-10-CM | POA: Diagnosis not present

## 2018-10-09 DIAGNOSIS — M9901 Segmental and somatic dysfunction of cervical region: Secondary | ICD-10-CM | POA: Diagnosis not present

## 2018-10-12 ENCOUNTER — Ambulatory Visit (INDEPENDENT_AMBULATORY_CARE_PROVIDER_SITE_OTHER): Payer: Medicare HMO

## 2018-10-12 DIAGNOSIS — I495 Sick sinus syndrome: Secondary | ICD-10-CM | POA: Diagnosis not present

## 2018-10-13 ENCOUNTER — Encounter: Payer: Self-pay | Admitting: Cardiology

## 2018-10-13 NOTE — Progress Notes (Signed)
Remote pacemaker transmission.   

## 2018-10-18 DIAGNOSIS — M79675 Pain in left toe(s): Secondary | ICD-10-CM | POA: Diagnosis not present

## 2018-10-18 DIAGNOSIS — B351 Tinea unguium: Secondary | ICD-10-CM | POA: Diagnosis not present

## 2018-10-18 DIAGNOSIS — L600 Ingrowing nail: Secondary | ICD-10-CM | POA: Diagnosis not present

## 2018-10-18 DIAGNOSIS — M79674 Pain in right toe(s): Secondary | ICD-10-CM | POA: Diagnosis not present

## 2018-11-13 DIAGNOSIS — M5136 Other intervertebral disc degeneration, lumbar region: Secondary | ICD-10-CM | POA: Diagnosis not present

## 2018-11-13 DIAGNOSIS — M9901 Segmental and somatic dysfunction of cervical region: Secondary | ICD-10-CM | POA: Diagnosis not present

## 2018-11-13 DIAGNOSIS — M9903 Segmental and somatic dysfunction of lumbar region: Secondary | ICD-10-CM | POA: Diagnosis not present

## 2018-11-13 DIAGNOSIS — M50322 Other cervical disc degeneration at C5-C6 level: Secondary | ICD-10-CM | POA: Diagnosis not present

## 2018-11-16 DIAGNOSIS — M50322 Other cervical disc degeneration at C5-C6 level: Secondary | ICD-10-CM | POA: Diagnosis not present

## 2018-11-16 DIAGNOSIS — M9901 Segmental and somatic dysfunction of cervical region: Secondary | ICD-10-CM | POA: Diagnosis not present

## 2018-11-16 DIAGNOSIS — M9903 Segmental and somatic dysfunction of lumbar region: Secondary | ICD-10-CM | POA: Diagnosis not present

## 2018-11-16 DIAGNOSIS — M5136 Other intervertebral disc degeneration, lumbar region: Secondary | ICD-10-CM | POA: Diagnosis not present

## 2018-11-17 DIAGNOSIS — L600 Ingrowing nail: Secondary | ICD-10-CM | POA: Diagnosis not present

## 2018-11-17 DIAGNOSIS — B351 Tinea unguium: Secondary | ICD-10-CM | POA: Diagnosis not present

## 2018-11-25 LAB — CUP PACEART REMOTE DEVICE CHECK
Date Time Interrogation Session: 20200125172528
Implantable Lead Implant Date: 20170525
Implantable Lead Location: 753860
Implantable Lead Model: 377
Implantable Lead Serial Number: 49470224
Implantable Lead Serial Number: 49485613
MDC IDC LEAD IMPLANT DT: 20170525
MDC IDC LEAD LOCATION: 753859
MDC IDC PG IMPLANT DT: 20170525
Pulse Gen Model: 394969
Pulse Gen Serial Number: 68798589

## 2018-11-28 DIAGNOSIS — J209 Acute bronchitis, unspecified: Secondary | ICD-10-CM | POA: Diagnosis not present

## 2018-12-18 DIAGNOSIS — B351 Tinea unguium: Secondary | ICD-10-CM | POA: Diagnosis not present

## 2018-12-18 DIAGNOSIS — L600 Ingrowing nail: Secondary | ICD-10-CM | POA: Diagnosis not present

## 2019-01-10 DIAGNOSIS — E119 Type 2 diabetes mellitus without complications: Secondary | ICD-10-CM | POA: Diagnosis not present

## 2019-01-11 ENCOUNTER — Encounter: Payer: Medicare HMO | Admitting: *Deleted

## 2019-01-16 ENCOUNTER — Ambulatory Visit: Payer: Medicare HMO | Admitting: Adult Health

## 2019-01-16 NOTE — Progress Notes (Deleted)
[facility] [address] [place]. [place] [address] [phone]       OFFICE FOLLOW UP NOTE  [name] Date of Birth:  Mar 28, 1941 Medical Record Number:  [id number]   Reason for visit: Stroke follow up  CHIEF COMPLAINT:  No chief complaint on file.   HPI:  01/16/19 VISIT     History 07/18/2018: Patient is being seen today for scheduled follow up visit. Overall, patient has been doing well. Continues to take plavix without side effects of bleeding or bruising. Continues to take lipitor without side effects of myalgias. Blood pressure satisfactory 96/64. Patient continues to stay active and maintains a healthy diet. Continues to wear CPAP for OSA management. Denies new or worsening stroke/TIA symptoms.    INITIAL VISIT 03/14/2018: Jordan [name] is being seen today in the office for frontal, parietal and [name] cerebellar hemisphere infarcts on 02/16/18. History obtained from patient and chart review. Reviewed all radiology images and labs personally.  [name] is a 78 y.o. male with history of coronary artery disease, previous strokes by imaging, diabetes mellitus, permanent cardiac pacemaker, obstructive sleep apnea on C Pap, narcolepsy, hypertension, and hyperlipidemia, presenting with speech difficulties, with left upper extremity numbness and weakness. He did not receive IV t-PA due to resolution of deficits.  CT head reviewed and showed no acute abnormalities.  MRI head reviewed and showed punctate foci of acute ischemia within both frontal and parietal lobes in the [name] cerebellar hemisphere.  MRA head negative for high-grade stenosis or occlusion.  Carotid Dopplers were unremarkable.  2D echo showed an EF of 60 to 65%.  Patient does have a pacemaker and it was recommended to interrogate pacemaker to rule out A. fib.  Per cardiology, the pacemaker reported normal evaluation without arrhythmias detected and normal function.  LDL 61 and A1c 8.6.   Patient was previously on Lipitor 20 mg PTA and recommended continue this at discharge.  Recommended close PCP follow-up and monitoring for uncontrolled diabetes.  Patient was not on antithrombotic PTA and recommended DAPT with aspirin and Plavix for 3 weeks and then Plavix alone. Patient was discharged home in stable condition.  Since discharge, patient has been doing well.  All symptoms have resolved and patient has returned to all previous activities.  He is still currently on DAPT with Plavix and aspirin with mild bruising but no bleeding.  Continues to take Lipitor without side effects of myalgias.  Primary care doctor has been managing diabetes and A1c level.  Today's blood pressure 144/88 but has recently been changing antihypertensives as blood pressure recently hypotensive.  Patient questioning if he is able to fly to [place] for his granddaughter's wedding in mid July.  Patient does have a history of sleep apnea and is compliant with a CPAP machine.  Denies new or worsening stroke/TIA symptoms.     ROS:   14 system review of systems performed and negative with exception of fatigue, eye itching, licensed, hearing loss, ringing in ears, apnea, daytime sleepiness, frequency of urination, urgency, back pain, memory loss and numbness  PMH:  Past Medical History:  Diagnosis Date   Arthritis    R shoulder, bone spur   Arthritis    "hands" (03/25/2016)   Chronic lower back pain    Coronary heart disease    Dr Pernell Dupre   H/O cardiovascular stress test    perhaps last one was 2009   Heart murmur    12/29/11 echo: mild MR, no AS,  trivial TR   History of gout    Hyperlipidemia    Hypertension    saw Dr. Linard Millers last earl;y- 2014, cardiac cath. last done ?2009, blocks seen didn't require any intervention at that point.   Narcolepsy    [name] 04-11-97; Mean Latency 1.66min, SOREM 2   OSA on CPAP    NPSG 12-13-98 AHI 22.7   PONV (postoperative nausea and vomiting)    Presence of  permanent cardiac pacemaker    Shortness of breath    with exertion    Stroke ([facility])    Type II diabetes mellitus ([facility])     PSH:  Past Surgical History:  Procedure Laterality Date   CARDIAC CATHETERIZATION  2009?   EP IMPLANTABLE DEVICE N/A 03/25/2016   Procedure: Pacemaker Implant - Dual Chamber;  Surgeon: Evans Lance, MD;  Location: [facility];  Service: Cardiovascular;  Laterality: N/A;   FRACTURE SURGERY     INSERT / REPLACE / REMOVE PACEMAKER  03/24/2016   LEFT AND [name] HEART CATHETERIZATION WITH CORONARY ANGIOGRAM N/A 06/05/2014   Procedure: LEFT AND [name] HEART CATHETERIZATION WITH CORONARY ANGIOGRAM;  Surgeon: Sinclair Grooms, MD;  Location: [facility] CATH [facility];  Service: Cardiovascular;  Laterality: N/A;   NASAL FRACTURE SURGERY     SHOULDER ARTHROSCOPY WITH ROTATOR CUFF REPAIR AND SUBACROMIAL DECOMPRESSION [name] 08/16/2013   Procedure: SHOULDER ARTHROSCOPY WITH ROTATOR CUFF REPAIR AND SUBACROMIAL DECOMPRESSION;  Surgeon: [name], MD;  Location: [facility];  Service: Orthopedics;  Laterality: [name];  [name] shoulder arthroscopy rotator cuff repair, subacromial decompression.   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[employer]   Occupation: [place] [employer]  Social Needs   Financial resource strain: Not on file   Food insecurity:    Worry: Not on file    Inability: Not on file   Transportation needs:    Medical: Not on file    Non-medical: Not on file  Tobacco Use   Smoking status: Former Smoker    Packs/day: 2.50    Years: 6.00    Pack years: 15.00    Types: Cigarettes    Last attempt to quit: 06/05/1967    Years since quitting: 51.6   Smokeless tobacco: Never Used  Substance and Sexual  Activity   Alcohol use: Yes    Comment: "not often", not regular, on occasion    Drug use: No   Sexual activity: Not Currently  Lifestyle   Physical activity:    Days per week: Not on file    Minutes per session: Not on file   Stress: Not on file  Relationships   Social connections:    Talks on phone: Not on file    Gets together: Not on file    Attends religious service: Not on file    Active member of club or organization: Not on file    Attends meetings of clubs or organizations: Not on file    Relationship status: Not on file   Intimate partner violence:    Fear of current or ex partner: Not on file    Emotionally abused: Not on file    Physically abused: Not on file    Forced sexual  activity: Not on file  Other Topics Concern   Not on file  Social History Narrative   Not on file    Family History:  Family History  Problem Relation Age of Onset   Sleep apnea Sister    Colon cancer Sister     Medications:   Current Outpatient Medications on File Prior to Visit  Medication Sig Dispense Refill   amphetamine-dextroamphetamine (ADDERALL) 10 MG tablet Take 1 tablet (10 mg total) by mouth daily. 90 tablet 0   aspirin 81 MG EC tablet Take 1 tablet (81 mg total) by mouth daily. Take only for 21 days 21 tablet 0   atorvastatin (LIPITOR) 40 MG tablet Take 20 mg by mouth daily before breakfast.      clopidogrel (PLAVIX) 75 MG tablet Take 1 tablet (75 mg total) by mouth daily. 30 tablet 1   glucosamine-chondroitin 500-400 MG tablet Take 2 tablets by mouth daily.     metFORMIN (GLUCOPHAGE-XR) 500 MG 24 hr tablet Take 2,000 mg by mouth daily with breakfast.      Multiple Vitamin (MULTIVITAMIN WITH MINERALS) TABS tablet Take 1 tablet by mouth daily.     NITROSTAT 0.4 MG SL tablet PLACE 1 TABLET (0.4 MG TOTAL) UNDER THE TONGUE EVERY 5 (FIVE) MINUTES AS NEEDED FOR CHEST PAIN. 25 tablet 5   Omega 3 1200 MG CAPS Take 7,200 mg by mouth daily.      ONE TOUCH ULTRA  TEST test strip Apply 1 strip topically as directed.     No current facility-administered medications on file prior to visit.     Allergies:  No Known Allergies   Physical Exam  There were no vitals filed for this visit. There is no height or weight on file to calculate BMI. No exam data present  General: well developed, pleasant elderly Caucasian male, well nourished, seated, in no evident distress Head: head normocephalic and atraumatic.   Neck: supple with no carotid or supraclavicular bruits Cardiovascular: regular rate and rhythm, no murmurs Musculoskeletal: no deformity Skin:  no rash/petichiae Vascular:  Normal pulses all extremities  Neurologic Exam Mental Status: Awake and fully alert. Oriented to place and time. Recent and remote memory intact. Attention span, concentration and fund of knowledge appropriate. Mood and affect appropriate.  Cranial Nerves: Fundoscopic exam reveals sharp disc margins. Pupils equal, briskly reactive to light. Extraocular movements full without nystagmus. Visual fields full to confrontation. Hearing intact. Facial sensation intact. Face, tongue, palate moves normally and symmetrically.  Motor: Normal bulk and tone. Normal strength in all tested extremity muscles. Sensory.: intact to touch , pinprick , position and vibratory sensation.  Coordination: Rapid alternating movements normal in all extremities. Finger-to-nose and heel-to-shin performed accurately bilaterally. Gait and Station: Arises from chair without difficulty. Stance is normal. Gait demonstrates normal stride length and balance . Able to heel, toe and tandem walk without difficulty.  Reflexes: 1+ and symmetric. Toes downgoing.    NIHSS  0 Modified Rankin  0   Diagnostic Data (Labs, Imaging, Testing)  Dg Chest 2 View 02/17/2018 IMPRESSION:  No active cardiopulmonary disease. [name] upper lobe calcified granuloma, stable in appearance. Aortic atherosclerosis.   Ct Head Wo  Contrast 02/16/2018 IMPRESSION:  Atrophy and small vessel disease. 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 No acute intracranial findings.   [name] Head Wo Contrast 02/17/2018 IMPRESSION:  1. Punctate foci of acute ischemia within both frontal and parietal lobes and the [name] cerebellar hemisphere. No associated hemorrhage or mass effect.  2. The  distribution of ischemic lesions involves multiple vascular territories and is suggestive of a central cardiac or proximal aortic embolic source. CTA of the neck may be helpful for further assessment.  3. Mild [name] middle cerebral artery M2 segment irregularity may indicate mild atherosclerotic disease, though the source data images suggest that this might be artifactual. No occlusion or hemodynamically significant stenosis within the intracranial circulation.  4. Multiple old infarcts, including left frontal cortical infarct, and findings of chronic ischemic microangiopathy.   Transthoracic Echocardiogram  - Left ventricle: The cavity size was normal. Wall thickness was increased in a pattern of mild LVH. Systolic function was normal. The estimated ejection fraction was in the range of 60% to 65%. Wall motion was normal; there were no regional wall motion abnormalities. Doppler parameters are consistent with abnormal left ventricular relaxation (grade 1 diastolic dysfunction). Doppler parameters are consistent with elevated mean left atrial filling pressure. - Left atrium: The atrium was mildly dilated.  Bilateral Carotid Dopplers  02/17/2018 Impression [name] Carotid: Velocities in the [name] ICA are consistent with a 1-39% stenosis. Left Carotid: Velocities in the left ICA are consistent with a 1-39% stenosis. Vertebrals: Bilateral vertebral arteries demonstrate antegrade flow.    ASSESSMENT: Jordan [name] is a 78 y.o. year old male here with  on multifocal punctate infarcts in the frontal, parietal and [name]  cerebella hemisphere concerning for embolic pattern. Vascular risk factors include HLD, HTN, OSA, CAD and DM.  Patient returns for scheduled follow-up visit and overall has been doing well from a stroke standpoint without residual deficits or recurrence of symptoms.   PLAN: -Continue clopidogrel 75 mg daily  and lipitor  for secondary stroke prevention -F/u with PCP regarding your HLD, HTN, and DM management -Advised to continue to be compliant with CPAP for OSA management and highly encouraged ensuring regular cleaning of machine to avoid respiratory infection -continue to monitor BP at home -Continue to stay active and maintain a healthy diet -Maintain strict control of hypertension with blood pressure goal below 130/90, diabetes with hemoglobin A1c goal below 6.5% and cholesterol with LDL cholesterol (bad cholesterol) goal below 70 mg/dL. I also advised the patient to eat a healthy diet with plenty of whole grains, cereals, fruits and vegetables, exercise regularly and maintain ideal body weight.  Follow up in 6 months or call earlier if needed   Greater than 50% time during this 25 minute consultation visit was spent on counseling and coordination of care about HTN, DM and HLD, discussion about risk benefit of anticoagulation and answering questions.    Venancio Poisson, AGNP-BC  [facility] [address] [place],  [address]  Phone [phone] Fax [phone]

## 2019-01-18 DIAGNOSIS — L600 Ingrowing nail: Secondary | ICD-10-CM | POA: Diagnosis not present

## 2019-01-18 DIAGNOSIS — B351 Tinea unguium: Secondary | ICD-10-CM | POA: Diagnosis not present

## 2019-01-18 DIAGNOSIS — M79674 Pain in right toe(s): Secondary | ICD-10-CM | POA: Diagnosis not present

## 2019-01-18 DIAGNOSIS — M79675 Pain in left toe(s): Secondary | ICD-10-CM | POA: Diagnosis not present

## 2019-01-19 ENCOUNTER — Other Ambulatory Visit: Payer: Self-pay

## 2019-01-19 ENCOUNTER — Encounter: Payer: Medicare HMO | Admitting: *Deleted

## 2019-01-25 ENCOUNTER — Encounter: Payer: Self-pay | Admitting: *Deleted

## 2019-01-26 ENCOUNTER — Encounter: Payer: Self-pay | Admitting: Internal Medicine

## 2019-01-31 LAB — CUP PACEART REMOTE DEVICE CHECK
Date Time Interrogation Session: 20200402113514
Implantable Lead Implant Date: 20170525
Implantable Lead Implant Date: 20170525
Implantable Lead Location: 753859
Implantable Lead Location: 753860
Implantable Lead Model: 377
Implantable Lead Model: 377
Implantable Lead Serial Number: 49470224
Implantable Lead Serial Number: 49485613
Implantable Pulse Generator Implant Date: 20170525
Pulse Gen Model: 394969
Pulse Gen Serial Number: 68798589

## 2019-02-01 ENCOUNTER — Other Ambulatory Visit: Payer: Self-pay

## 2019-02-01 ENCOUNTER — Ambulatory Visit (INDEPENDENT_AMBULATORY_CARE_PROVIDER_SITE_OTHER): Payer: Medicare HMO | Admitting: *Deleted

## 2019-02-01 DIAGNOSIS — I495 Sick sinus syndrome: Secondary | ICD-10-CM

## 2019-02-02 ENCOUNTER — Ambulatory Visit: Payer: Medicare HMO | Admitting: Internal Medicine

## 2019-02-09 ENCOUNTER — Encounter: Payer: Self-pay | Admitting: Cardiology

## 2019-02-09 NOTE — Progress Notes (Signed)
Remote pacemaker transmission.   

## 2019-03-05 DIAGNOSIS — M9904 Segmental and somatic dysfunction of sacral region: Secondary | ICD-10-CM | POA: Diagnosis not present

## 2019-03-05 DIAGNOSIS — M9905 Segmental and somatic dysfunction of pelvic region: Secondary | ICD-10-CM | POA: Diagnosis not present

## 2019-03-05 DIAGNOSIS — M9903 Segmental and somatic dysfunction of lumbar region: Secondary | ICD-10-CM | POA: Diagnosis not present

## 2019-03-05 DIAGNOSIS — M5136 Other intervertebral disc degeneration, lumbar region: Secondary | ICD-10-CM | POA: Diagnosis not present

## 2019-03-07 ENCOUNTER — Telehealth: Payer: Self-pay | Admitting: Internal Medicine

## 2019-03-07 MED ORDER — AMPHETAMINE-DEXTROAMPHETAMINE 10 MG PO TABS: 10.0000 mg | ORAL_TABLET | Freq: Every day | ORAL | 0 refills | Status: DC

## 2019-03-07 NOTE — Telephone Encounter (Signed)
Pt.notified

## 2019-03-07 NOTE — Telephone Encounter (Signed)
Adderall refill e-sent 

## 2019-03-07 NOTE — Telephone Encounter (Signed)
Pt called requesting refill of Adderall 10mg . Med last filled for pt 08/04/18 #90 with 0 RF and pt last seen at office 02/01/18.  Dr. Annamaria Boots, please advise if you are okay refilling med for pt and if you are, preferred pharmacy is [facility] [address]  No Known Allergies   Current Outpatient Medications:  .  amphetamine-dextroamphetamine (ADDERALL) 10 MG tablet, Take 1 tablet (10 mg total) by mouth daily., Disp: 90 tablet, Rfl: 0 .  aspirin 81 MG EC tablet, Take 1 tablet (81 mg total) by mouth daily. Take only for 21 days, Disp: 21 tablet, Rfl: 0 .  atorvastatin (LIPITOR) 40 MG tablet, Take 20 mg by mouth daily before breakfast. , Disp: , Rfl:  .  clopidogrel (PLAVIX) 75 MG tablet, Take 1 tablet (75 mg total) by mouth daily., Disp: 30 tablet, Rfl: 1 .  glucosamine-chondroitin 500-400 MG tablet, Take 2 tablets by mouth daily., Disp: , Rfl:  .  metFORMIN (GLUCOPHAGE-XR) 500 MG 24 hr tablet, Take 2,000 mg by mouth daily with breakfast. , Disp: , Rfl:  .  Multiple Vitamin (MULTIVITAMIN WITH MINERALS) TABS tablet, Take 1 tablet by mouth daily., Disp: , Rfl:  .  NITROSTAT 0.4 MG SL tablet, PLACE 1 TABLET (0.4 MG TOTAL) UNDER THE TONGUE EVERY 5 (FIVE) MINUTES AS NEEDED FOR CHEST PAIN., Disp: 25 tablet, Rfl: 5 .  Omega 3 1200 MG CAPS, Take 7,200 mg by mouth daily. , Disp: , Rfl:  .  ONE TOUCH ULTRA TEST test strip, Apply 1 strip topically as directed., Disp: , Rfl:

## 2019-03-15 DIAGNOSIS — R972 Elevated prostate specific antigen [PSA]: Secondary | ICD-10-CM | POA: Diagnosis not present

## 2019-03-15 DIAGNOSIS — G4730 Sleep apnea, unspecified: Secondary | ICD-10-CM | POA: Diagnosis not present

## 2019-03-15 DIAGNOSIS — E119 Type 2 diabetes mellitus without complications: Secondary | ICD-10-CM | POA: Diagnosis not present

## 2019-03-15 DIAGNOSIS — I10 Essential (primary) hypertension: Secondary | ICD-10-CM | POA: Diagnosis not present

## 2019-03-15 DIAGNOSIS — I679 Cerebrovascular disease, unspecified: Secondary | ICD-10-CM | POA: Diagnosis not present

## 2019-03-15 DIAGNOSIS — Z950 Presence of cardiac pacemaker: Secondary | ICD-10-CM | POA: Diagnosis not present

## 2019-03-15 DIAGNOSIS — G47419 Narcolepsy without cataplexy: Secondary | ICD-10-CM | POA: Diagnosis not present

## 2019-03-15 DIAGNOSIS — E7800 Pure hypercholesterolemia, unspecified: Secondary | ICD-10-CM | POA: Diagnosis not present

## 2019-03-15 DIAGNOSIS — I2510 Atherosclerotic heart disease of native coronary artery without angina pectoris: Secondary | ICD-10-CM | POA: Diagnosis not present

## 2019-04-11 DIAGNOSIS — H401122 Primary open-angle glaucoma, left eye, moderate stage: Secondary | ICD-10-CM | POA: Diagnosis not present

## 2019-04-11 DIAGNOSIS — H401121 Primary open-angle glaucoma, left eye, mild stage: Secondary | ICD-10-CM | POA: Diagnosis not present

## 2019-04-17 DIAGNOSIS — M9905 Segmental and somatic dysfunction of pelvic region: Secondary | ICD-10-CM | POA: Diagnosis not present

## 2019-04-17 DIAGNOSIS — M9903 Segmental and somatic dysfunction of lumbar region: Secondary | ICD-10-CM | POA: Diagnosis not present

## 2019-04-17 DIAGNOSIS — M9904 Segmental and somatic dysfunction of sacral region: Secondary | ICD-10-CM | POA: Diagnosis not present

## 2019-04-17 DIAGNOSIS — M5136 Other intervertebral disc degeneration, lumbar region: Secondary | ICD-10-CM | POA: Diagnosis not present

## 2019-04-19 DIAGNOSIS — M79674 Pain in right toe(s): Secondary | ICD-10-CM | POA: Diagnosis not present

## 2019-04-19 DIAGNOSIS — B351 Tinea unguium: Secondary | ICD-10-CM | POA: Diagnosis not present

## 2019-04-19 DIAGNOSIS — L600 Ingrowing nail: Secondary | ICD-10-CM | POA: Diagnosis not present

## 2019-04-19 DIAGNOSIS — M79675 Pain in left toe(s): Secondary | ICD-10-CM | POA: Diagnosis not present

## 2019-05-01 DIAGNOSIS — E7800 Pure hypercholesterolemia, unspecified: Secondary | ICD-10-CM | POA: Diagnosis not present

## 2019-05-01 DIAGNOSIS — Z7984 Long term (current) use of oral hypoglycemic drugs: Secondary | ICD-10-CM | POA: Diagnosis not present

## 2019-05-01 DIAGNOSIS — E119 Type 2 diabetes mellitus without complications: Secondary | ICD-10-CM | POA: Diagnosis not present

## 2019-05-01 DIAGNOSIS — R972 Elevated prostate specific antigen [PSA]: Secondary | ICD-10-CM | POA: Diagnosis not present

## 2019-05-03 ENCOUNTER — Ambulatory Visit (INDEPENDENT_AMBULATORY_CARE_PROVIDER_SITE_OTHER): Payer: Medicare HMO | Admitting: *Deleted

## 2019-05-03 DIAGNOSIS — I495 Sick sinus syndrome: Secondary | ICD-10-CM

## 2019-05-03 LAB — CUP PACEART REMOTE DEVICE CHECK
Battery Remaining Percentage: 75 %
Brady Statistic RA Percent Paced: 92 %
Brady Statistic RV Percent Paced: 7 %
Date Time Interrogation Session: 20200702093028
Implantable Lead Implant Date: 20170525
Implantable Lead Implant Date: 20170525
Implantable Lead Location: 753859
Implantable Lead Location: 753860
Implantable Lead Model: 377
Implantable Lead Model: 377
Implantable Lead Serial Number: 49470224
Implantable Lead Serial Number: 49485613
Implantable Pulse Generator Implant Date: 20170525
Lead Channel Impedance Value: 468 Ohm
Lead Channel Impedance Value: 527 Ohm
Lead Channel Pacing Threshold Amplitude: 1.1 V
Lead Channel Pacing Threshold Amplitude: 1.3 V
Lead Channel Pacing Threshold Pulse Width: 0.4 ms
Lead Channel Pacing Threshold Pulse Width: 0.4 ms
Lead Channel Sensing Intrinsic Amplitude: 10.5 mV
Lead Channel Sensing Intrinsic Amplitude: 6.2 mV
Lead Channel Setting Pacing Amplitude: 2.4 V
Lead Channel Setting Pacing Amplitude: 2.4 V
Lead Channel Setting Pacing Pulse Width: 0.4 ms
Pulse Gen Model: 394969
Pulse Gen Serial Number: 68798589

## 2019-05-11 ENCOUNTER — Encounter: Payer: Self-pay | Admitting: Cardiology

## 2019-05-11 NOTE — Progress Notes (Signed)
Remote pacemaker transmission.   

## 2019-05-17 DIAGNOSIS — L600 Ingrowing nail: Secondary | ICD-10-CM | POA: Diagnosis not present

## 2019-05-17 DIAGNOSIS — M79674 Pain in right toe(s): Secondary | ICD-10-CM | POA: Diagnosis not present

## 2019-05-17 DIAGNOSIS — B351 Tinea unguium: Secondary | ICD-10-CM | POA: Diagnosis not present

## 2019-05-17 DIAGNOSIS — M79675 Pain in left toe(s): Secondary | ICD-10-CM | POA: Diagnosis not present

## 2019-05-24 ENCOUNTER — Telehealth: Payer: Self-pay

## 2019-05-24 NOTE — Telephone Encounter (Signed)
I called and left patient a message to call back for covid screening questions. Patient has an appointment tomorrow with Dr. Lovena Le.

## 2019-05-25 ENCOUNTER — Ambulatory Visit (INDEPENDENT_AMBULATORY_CARE_PROVIDER_SITE_OTHER): Payer: Medicare HMO | Admitting: Internal Medicine

## 2019-05-25 ENCOUNTER — Encounter: Payer: Self-pay | Admitting: Internal Medicine

## 2019-05-25 ENCOUNTER — Other Ambulatory Visit: Payer: Self-pay

## 2019-05-25 VITALS — BP 126/86 | HR 72 | Ht 67.0 in | Wt 230.0 lb

## 2019-05-25 DIAGNOSIS — I495 Sick sinus syndrome: Secondary | ICD-10-CM

## 2019-05-25 DIAGNOSIS — Z950 Presence of cardiac pacemaker: Secondary | ICD-10-CM

## 2019-05-25 DIAGNOSIS — I10 Essential (primary) hypertension: Secondary | ICD-10-CM

## 2019-05-25 LAB — CUP PACEART INCLINIC DEVICE CHECK
Brady Statistic AP VP Percent: 5 %
Brady Statistic AP VS Percent: 86 %
Brady Statistic AS VP Percent: 1 %
Brady Statistic AS VS Percent: 7 %
Date Time Interrogation Session: 20200724160447
Implantable Lead Implant Date: 20170525
Implantable Lead Implant Date: 20170525
Implantable Lead Location: 753859
Implantable Lead Location: 753860
Implantable Lead Model: 377
Implantable Lead Model: 377
Implantable Lead Serial Number: 49470224
Implantable Lead Serial Number: 49485613
Implantable Pulse Generator Implant Date: 20170525
Lead Channel Impedance Value: 487 Ohm
Lead Channel Impedance Value: 546 Ohm
Lead Channel Pacing Threshold Amplitude: 1 V
Lead Channel Pacing Threshold Amplitude: 1 V
Lead Channel Pacing Threshold Amplitude: 1 V
Lead Channel Pacing Threshold Amplitude: 1.1 V
Lead Channel Pacing Threshold Amplitude: 1.2 V
Lead Channel Pacing Threshold Amplitude: 1.2 V
Lead Channel Pacing Threshold Amplitude: 1.3 V
Lead Channel Pacing Threshold Amplitude: 1.4 V
Lead Channel Pacing Threshold Pulse Width: 0.4 ms
Lead Channel Pacing Threshold Pulse Width: 0.4 ms
Lead Channel Pacing Threshold Pulse Width: 0.4 ms
Lead Channel Pacing Threshold Pulse Width: 0.4 ms
Lead Channel Pacing Threshold Pulse Width: 0.4 ms
Lead Channel Pacing Threshold Pulse Width: 0.4 ms
Lead Channel Pacing Threshold Pulse Width: 0.5 ms
Lead Channel Pacing Threshold Pulse Width: 0.5 ms
Lead Channel Sensing Intrinsic Amplitude: 11.3 mV
Lead Channel Sensing Intrinsic Amplitude: 11.4 mV
Lead Channel Sensing Intrinsic Amplitude: 8 mV
Lead Channel Sensing Intrinsic Amplitude: 8 mV
Lead Channel Setting Pacing Amplitude: 2.4 V
Lead Channel Setting Pacing Amplitude: 2.4 V
Lead Channel Setting Pacing Pulse Width: 0.4 ms
Pulse Gen Model: 394969
Pulse Gen Serial Number: 68798589

## 2019-05-25 NOTE — Progress Notes (Signed)
HPI Jordan [name] returns today for followup. He is a pleasant 78 yo man with a h/o chronotropic incompetence, s/p PPM insertion. He has HTN and obesity. He has been unable to lose any weight.   No Known Allergies   Current Outpatient Medications  Medication Sig Dispense Refill  . amphetamine-dextroamphetamine (ADDERALL) 10 MG tablet Take 1 tablet (10 mg total) by mouth daily. 90 tablet 0  . aspirin 81 MG EC tablet Take 1 tablet (81 mg total) by mouth daily. Take only for 21 days 21 tablet 0  . atorvastatin (LIPITOR) 40 MG tablet Take 20 mg by mouth daily before breakfast.     . clopidogrel (PLAVIX) 75 MG tablet Take 1 tablet (75 mg total) by mouth daily. 30 tablet 1  . glucosamine-chondroitin 500-400 MG tablet Take 2 tablets by mouth daily.    . metFORMIN (GLUCOPHAGE-XR) 500 MG 24 hr tablet Take 2,000 mg by mouth daily with breakfast.     . Multiple Vitamin (MULTIVITAMIN WITH MINERALS) TABS tablet Take 1 tablet by mouth daily.    [name] NITROSTAT 0.4 MG SL tablet PLACE 1 TABLET (0.4 MG TOTAL) UNDER THE TONGUE EVERY 5 (FIVE) MINUTES AS NEEDED FOR CHEST PAIN. 25 tablet 5  . Omega 3 1200 MG CAPS Take 7,200 mg by mouth daily.     . ONE TOUCH ULTRA TEST test strip Apply 1 strip topically as directed.    [name] lisinopril (ZESTRIL) 10 MG tablet Take 10 mg by mouth daily.     No current facility-administered medications for this visit.      Past Medical History:  Diagnosis Date  . Arthritis    R shoulder, bone spur  . Arthritis    "hands" (03/25/2016)  . Chronic lower back pain   . Coronary heart disease    Dr Pernell Dupre  . H/O cardiovascular stress test    perhaps last one was 2009  . Heart murmur    12/29/11 echo: mild MR, no AS, trivial TR  . History of gout   . Hyperlipidemia   . Hypertension    saw Dr. Linard Millers last earl;y- 2014, cardiac cath. last done ?2009, blocks seen didn't require any intervention at that point.  . Narcolepsy    [name] 04-11-97; Mean Latency 1.17min, SOREM 2   . OSA on CPAP    NPSG 12-13-98 AHI 22.7  . PONV (postoperative nausea and vomiting)   . Presence of permanent cardiac pacemaker   . Shortness of breath    with exertion   . Stroke ([facility])   . Type II diabetes mellitus ([facility])     ROS:   All systems reviewed and negative except as noted in the HPI.   Past Surgical History:  Procedure Laterality Date  . CARDIAC CATHETERIZATION  2009?  . EP IMPLANTABLE DEVICE N/A 03/25/2016   Procedure: Pacemaker Implant - Dual Chamber;  Surgeon: Evans Lance, MD;  Location: [facility];  Service: Cardiovascular;  Laterality: N/A;  . FRACTURE SURGERY    . INSERT / REPLACE / REMOVE PACEMAKER  03/24/2016  . LEFT AND RIGHT HEART CATHETERIZATION WITH CORONARY ANGIOGRAM N/A 06/05/2014   Procedure: LEFT AND RIGHT HEART CATHETERIZATION WITH CORONARY ANGIOGRAM;  Surgeon: Sinclair Grooms, MD;  Location: [facility];  Service: Cardiovascular;  Laterality: N/A;  . NASAL FRACTURE SURGERY    . SHOULDER ARTHROSCOPY WITH ROTATOR CUFF REPAIR AND SUBACROMIAL DECOMPRESSION Right 08/16/2013   Procedure: SHOULDER ARTHROSCOPY WITH ROTATOR CUFF REPAIR AND  SUBACROMIAL DECOMPRESSION;  Surgeon: [name], MD;  Location: [facility];  Service: Orthopedics;  Laterality: Right;  Right shoulder arthroscopy rotator cuff repair, subacromial decompression.  . TONSILLECTOMY       Family History  Problem Relation Age of Onset  . Sleep apnea Sister   . Colon cancer Sister      Social History   Socioeconomic History  . Marital status: Married    Spouse name: Not on file  . Number of children: Not on file  . Years of education: Not on file  . Highest education level: Not on file  Occupational History  . 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 Financial resource strain: Not on file  . Food insecurity    Worry: Not on file    Inability: Not on file  . Transportation needs    Medical: Not on file    Non-medical: Not  on file  Tobacco Use  . Smoking status: Former Smoker    Packs/day: 2.50    Years: 6.00    Pack years: 15.00    Types: Cigarettes    Quit date: 06/05/1967    Years since quitting: 52.0  . Smokeless tobacco: Never Used  Substance and Sexual Activity  . Alcohol use: Yes    Comment: "not often", not regular, on occasion   . Drug use: No  . Sexual activity: Not Currently  Lifestyle  . Physical activity    Days per week: Not on file    Minutes per session: Not on file  . Stress: Not on file  Relationships  . Social Herbalist on phone: Not on file    Gets together: Not on file    Attends religious service: Not on file    Active member of club or organization: Not on file    Attends meetings of clubs or organizations: Not on file    Relationship status: Not on file  . Intimate partner violence    Fear of current or ex partner: Not on file    Emotionally abused: Not on file    Physically abused: Not on file    Forced sexual activity: Not on file  Other Topics Concern  . Not on file  Social History Narrative  . Not on file     BP 126/86   Pulse 72   Ht 5\' 7"  (1.702 m)   Wt 230 lb (104.3 kg)   SpO2 92%   BMI 36.02 kg/m   Physical Exam:  Well appearing NAD HEENT: Unremarkable Neck:  No JVD, no thyromegally Lymphatics:  No adenopathy Back:  No CVA tenderness Lungs:  Clear with no wheezes HEART:  Regular rate rhythm, no murmurs, no rubs, no clicks Abd:  soft, positive bowel sounds, no organomegally, no rebound, no guarding Ext:  2 plus pulses, no edema, no cyanosis, no clubbing Skin:  No rashes no nodules Neuro:  CN II through XII intact, motor grossly intact  EKG - nsr with atrial pacing and RBBB  DEVICE  Normal device function.  See PaceArt for details.   Assess/Plan: 1. Sinus node dysfunction - his underlying rate today is in the low 40's. He is asymptomatic, s/p PPM insertion. 2. PPM - his Biotronik DDD PM is working normally.  3. HTN - his blood  pressure is well controlled. No change in meds. 4. Obesity - he is encouraged to lose weight. BMI is 36.   Mikle Bosworth.D.

## 2019-05-25 NOTE — Patient Instructions (Signed)
Medication Instructions:  Your physician recommends that you continue on your current medications as directed. Please refer to the Current Medication list given to you today.  Labwork: None ordered.  Testing/Procedures: None ordered.  Follow-Up: Your physician wants you to follow-up in: one year with Dr. Lovena Le.   You will receive a reminder letter in the mail two months in advance. If you don't receive a letter, please call our office to schedule the follow-up appointment.  Remote monitoring is used to monitor your Pacemaker from home. This monitoring reduces the number of office visits required to check your device to one time per year. It allows [place] to keep an eye on the functioning of your device to ensure it is working properly. You are scheduled for a device check from home on 08/02/2019. You may send your transmission at any time that day. If you have a wireless device, the transmission will be sent automatically. After your physician reviews your transmission, you will receive a postcard with your next transmission date.  Any Other Special Instructions Will Be Listed Below (If Applicable).  If you need a refill on your cardiac medications before your next appointment, please call your pharmacy.

## 2019-06-11 ENCOUNTER — Telehealth: Payer: Self-pay | Admitting: Internal Medicine

## 2019-06-11 DIAGNOSIS — R3915 Urgency of urination: Secondary | ICD-10-CM | POA: Diagnosis not present

## 2019-06-11 DIAGNOSIS — N401 Enlarged prostate with lower urinary tract symptoms: Secondary | ICD-10-CM | POA: Diagnosis not present

## 2019-06-11 DIAGNOSIS — R972 Elevated prostate specific antigen [PSA]: Secondary | ICD-10-CM | POA: Diagnosis not present

## 2019-06-11 NOTE — Telephone Encounter (Signed)
Informed pt of transmission results.

## 2019-06-11 NOTE — Telephone Encounter (Signed)
I let the pt know we did receive a transmission from his home monitor. I told him I will have the nurse take a look at it and give him a call. The pt states he feels okay.

## 2019-06-11 NOTE — Telephone Encounter (Signed)
New message   Patient states that he fell into the end of a rocking chair arm. Patient states that he does not have have any symptoms or pain. Patient states that he just wanted to report his incident.

## 2019-06-11 NOTE — Telephone Encounter (Signed)
I spoke with pt. He reports he tripped yesterday afternoon and fell into wooden chair arm. Landed on center of chest. No dizziness or feeling as if he were going to pass out when he fell. Today he has a fist sized bruised in middle of chest. He is feeling OK today. History of pacemaker placement. I told pt to continue to monitor bruising.  I let him know I would send message to device clinic and they would contact him if anything needed to be done.

## 2019-06-11 NOTE — Telephone Encounter (Signed)
Follow up ° ° °Patient is returning your call. Please call. ° ° ° °

## 2019-06-11 NOTE — Telephone Encounter (Signed)
LMOVM. Transmission reviewed. No alerts, no episodes, lead trends stable. Battery @ 75%.

## 2019-06-13 ENCOUNTER — Other Ambulatory Visit: Payer: Self-pay

## 2019-06-13 ENCOUNTER — Ambulatory Visit (INDEPENDENT_AMBULATORY_CARE_PROVIDER_SITE_OTHER): Payer: Medicare HMO | Admitting: Internal Medicine

## 2019-06-13 ENCOUNTER — Encounter: Payer: Self-pay | Admitting: Internal Medicine

## 2019-06-13 DIAGNOSIS — G47419 Narcolepsy without cataplexy: Secondary | ICD-10-CM

## 2019-06-13 DIAGNOSIS — G4733 Obstructive sleep apnea (adult) (pediatric): Secondary | ICD-10-CM | POA: Diagnosis not present

## 2019-06-13 MED ORDER — AMPHETAMINE-DEXTROAMPHETAMINE 10 MG PO TABS: ORAL_TABLET | ORAL | 0 refills | Status: DC

## 2019-06-13 NOTE — Progress Notes (Signed)
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 VN Vet NPSG 12/13/98- AHI 22.7/hr Office Spirometry 01/31/14-  Within normal limits-FVC 3.09/77%, FEV1 2.45/80% , FEV1/FVC 0.79, FEF 25-75% 2.45/87%.  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 VN Vet CPAP 9/ [facility]  Follows For: OSA, wears every night but has not been sleeping well at all,  Adderall 10 mg,  Some family stress stress.  Difficult and often impossible to get back to sleep after waking around 2 or 3 AM for bathroom. Still uses one, rarely two adderalls daily, and naps Very comfortable with his CPAP will not sleep without it, depends on it.  Just got [employer] machine. Has noted some changes in stamina, balance and short-term memory-I asked him to discuss with his primary physician.  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 VN Vet CPAP 9/ [facility] Adderall 10 mg,  -----OSA on CPAP 9, DME: [facility]; no complaints, did not bring DL card for appt today Body weight today 234 lbs Doing well with CPAP- can't sleep without it.  Uses adderall ordinarily only 1-2/ dya, but asks script still allow # 90 for fewer refills. Fell, hitting sternum on blunt arm of chair. Bruise but no other injury.  ROS-see HPI    + = positive Constitutional:   No-   weight loss, night sweats, fevers, chills, fatigue, lassitude. HEENT:   No-  headaches, difficulty swallowing, tooth/dental problems, sore throat,       No-  sneezing, itching, ear ache, nasal congestion, post nasal drip,  CV:  No-   chest pain, orthopnea, PND, swelling in lower extremities, anasarca, dizziness, palpitations Resp: + shortness of breath with exertion or at rest.              No-   productive cough,  No  non-productive cough,  No- coughing up of blood.              No-   change in color of mucus.  No- wheezing.   Skin: No-   rash or lesions. GI:  No-   heartburn, indigestion, abdominal pain, nausea, vomiting,  GU:  MS:  . Neuro-     ?'s stamina, memory, balance Psych:  No- change in mood or affect. No depression or anxiety.  No memory loss.  OBJ- Physical Exam General- Alert, Oriented, Affect-appropriate, Distress- none acute.+ overweight Skin- rash-none, lesions- none, excoriation- none Lymphadenopathy- none Head- atraumatic            Eyes- Gross vision intact, PERRLA, conjunctivae and secretions clear            Ears- Hearing, canals-normal            Nose- Clear, no-Septal dev, mucus, polyps, erosion, perforation             Throat- Mallampati III , mucosa clear , drainage- none, tonsils- atrophic Neck- flexible , trachea midline, no stridor , thyroid nl, carotid no bruit Chest - symmetrical excursion , unlabored           Heart/CV- RRR , no murmur , no gallop  , no rub, nl s1 s2                           - JVD- none , edema- none, stasis changes- none, varices- none  Lung- clear to P&A, wheeze- none, cough- none , dullness-none, rub- none           Chest wall-  + pacemaker, bruise xiphoid process, no rub, pacer pocket not involved. Abd- Br/ Gen/ Rectal- Not done, not indicated Extrem- cyanosis- none, clubbing, none, atrophy- none, strength- nl Neuro- grossly intact to observation, no tremor

## 2019-06-13 NOTE — Patient Instructions (Signed)
We can continue CPAP 9, mask of choice, humidifier, supplies, Airview/ card  The adderall script has been changed back to allow up to 3 tabs per day, if needed.   Please call if we can help

## 2019-06-15 DIAGNOSIS — M79675 Pain in left toe(s): Secondary | ICD-10-CM | POA: Diagnosis not present

## 2019-06-15 DIAGNOSIS — L600 Ingrowing nail: Secondary | ICD-10-CM | POA: Diagnosis not present

## 2019-06-15 DIAGNOSIS — M79674 Pain in right toe(s): Secondary | ICD-10-CM | POA: Diagnosis not present

## 2019-06-15 DIAGNOSIS — B351 Tinea unguium: Secondary | ICD-10-CM | POA: Diagnosis not present

## 2019-06-17 NOTE — Assessment & Plan Note (Signed)
Remains comfortable and benefits from CPAP Plan- continue CPAP 9. Can change to autopap when machine replaced.

## 2019-06-17 NOTE — Assessment & Plan Note (Signed)
Pattern is stable. He naps as needed, careful about driving, good sleep habits. Plan- Adderall 1-3/ day as needed, call for refills

## 2019-07-11 DIAGNOSIS — B351 Tinea unguium: Secondary | ICD-10-CM | POA: Diagnosis not present

## 2019-07-11 DIAGNOSIS — L600 Ingrowing nail: Secondary | ICD-10-CM | POA: Diagnosis not present

## 2019-08-02 ENCOUNTER — Ambulatory Visit (INDEPENDENT_AMBULATORY_CARE_PROVIDER_SITE_OTHER): Payer: Medicare HMO | Admitting: *Deleted

## 2019-08-02 DIAGNOSIS — I495 Sick sinus syndrome: Secondary | ICD-10-CM

## 2019-08-02 LAB — CUP PACEART REMOTE DEVICE CHECK
Battery Remaining Percentage: 75 %
Brady Statistic RA Percent Paced: 90 %
Brady Statistic RV Percent Paced: 7 %
Date Time Interrogation Session: 20201001123514
Implantable Lead Implant Date: 20170525
Implantable Lead Implant Date: 20170525
Implantable Lead Location: 753859
Implantable Lead Location: 753860
Implantable Lead Model: 377
Implantable Lead Model: 377
Implantable Lead Serial Number: 49470224
Implantable Lead Serial Number: 49485613
Implantable Pulse Generator Implant Date: 20170525
Lead Channel Impedance Value: 488 Ohm
Lead Channel Impedance Value: 527 Ohm
Lead Channel Pacing Threshold Amplitude: 1.4 V
Lead Channel Pacing Threshold Pulse Width: 0.4 ms
Lead Channel Sensing Intrinsic Amplitude: 10.3 mV
Lead Channel Sensing Intrinsic Amplitude: 2.6 mV
Lead Channel Setting Pacing Amplitude: 2.4 V
Lead Channel Setting Pacing Amplitude: 2.4 V
Lead Channel Setting Pacing Pulse Width: 0.4 ms
Pulse Gen Model: 394969
Pulse Gen Serial Number: 68798589

## 2019-08-06 DIAGNOSIS — M9903 Segmental and somatic dysfunction of lumbar region: Secondary | ICD-10-CM | POA: Diagnosis not present

## 2019-08-06 DIAGNOSIS — M50322 Other cervical disc degeneration at C5-C6 level: Secondary | ICD-10-CM | POA: Diagnosis not present

## 2019-08-06 DIAGNOSIS — M9901 Segmental and somatic dysfunction of cervical region: Secondary | ICD-10-CM | POA: Diagnosis not present

## 2019-08-06 DIAGNOSIS — M5136 Other intervertebral disc degeneration, lumbar region: Secondary | ICD-10-CM | POA: Diagnosis not present

## 2019-08-09 ENCOUNTER — Encounter: Payer: Self-pay | Admitting: Cardiology

## 2019-08-09 NOTE — Progress Notes (Signed)
Remote pacemaker transmission.   

## 2019-09-04 DIAGNOSIS — R972 Elevated prostate specific antigen [PSA]: Secondary | ICD-10-CM | POA: Diagnosis not present

## 2019-09-05 DIAGNOSIS — M9901 Segmental and somatic dysfunction of cervical region: Secondary | ICD-10-CM | POA: Diagnosis not present

## 2019-09-05 DIAGNOSIS — M9903 Segmental and somatic dysfunction of lumbar region: Secondary | ICD-10-CM | POA: Diagnosis not present

## 2019-09-05 DIAGNOSIS — M5136 Other intervertebral disc degeneration, lumbar region: Secondary | ICD-10-CM | POA: Diagnosis not present

## 2019-09-05 DIAGNOSIS — M50322 Other cervical disc degeneration at C5-C6 level: Secondary | ICD-10-CM | POA: Diagnosis not present

## 2019-09-06 DIAGNOSIS — Z950 Presence of cardiac pacemaker: Secondary | ICD-10-CM | POA: Diagnosis not present

## 2019-09-06 DIAGNOSIS — I679 Cerebrovascular disease, unspecified: Secondary | ICD-10-CM | POA: Diagnosis not present

## 2019-09-06 DIAGNOSIS — E7800 Pure hypercholesterolemia, unspecified: Secondary | ICD-10-CM | POA: Diagnosis not present

## 2019-09-06 DIAGNOSIS — I2510 Atherosclerotic heart disease of native coronary artery without angina pectoris: Secondary | ICD-10-CM | POA: Diagnosis not present

## 2019-09-06 DIAGNOSIS — G4730 Sleep apnea, unspecified: Secondary | ICD-10-CM | POA: Diagnosis not present

## 2019-09-06 DIAGNOSIS — I10 Essential (primary) hypertension: Secondary | ICD-10-CM | POA: Diagnosis not present

## 2019-09-06 DIAGNOSIS — G47419 Narcolepsy without cataplexy: Secondary | ICD-10-CM | POA: Diagnosis not present

## 2019-09-06 DIAGNOSIS — E119 Type 2 diabetes mellitus without complications: Secondary | ICD-10-CM | POA: Diagnosis not present

## 2019-09-10 DIAGNOSIS — R972 Elevated prostate specific antigen [PSA]: Secondary | ICD-10-CM | POA: Diagnosis not present

## 2019-09-10 DIAGNOSIS — N401 Enlarged prostate with lower urinary tract symptoms: Secondary | ICD-10-CM | POA: Diagnosis not present

## 2019-09-10 DIAGNOSIS — R350 Frequency of micturition: Secondary | ICD-10-CM | POA: Diagnosis not present

## 2019-09-17 ENCOUNTER — Other Ambulatory Visit: Payer: Self-pay

## 2019-09-17 DIAGNOSIS — Z20828 Contact with and (suspected) exposure to other viral communicable diseases: Secondary | ICD-10-CM | POA: Diagnosis not present

## 2019-09-17 DIAGNOSIS — Z20822 Contact with and (suspected) exposure to covid-19: Secondary | ICD-10-CM

## 2019-09-19 LAB — NOVEL CORONAVIRUS, NAA: SARS-CoV-2, NAA: NOT DETECTED

## 2019-10-02 DIAGNOSIS — M5136 Other intervertebral disc degeneration, lumbar region: Secondary | ICD-10-CM | POA: Diagnosis not present

## 2019-10-02 DIAGNOSIS — M9901 Segmental and somatic dysfunction of cervical region: Secondary | ICD-10-CM | POA: Diagnosis not present

## 2019-10-02 DIAGNOSIS — M9903 Segmental and somatic dysfunction of lumbar region: Secondary | ICD-10-CM | POA: Diagnosis not present

## 2019-10-02 DIAGNOSIS — M50322 Other cervical disc degeneration at C5-C6 level: Secondary | ICD-10-CM | POA: Diagnosis not present

## 2019-10-09 DIAGNOSIS — M50322 Other cervical disc degeneration at C5-C6 level: Secondary | ICD-10-CM | POA: Diagnosis not present

## 2019-10-09 DIAGNOSIS — M9903 Segmental and somatic dysfunction of lumbar region: Secondary | ICD-10-CM | POA: Diagnosis not present

## 2019-10-09 DIAGNOSIS — M9901 Segmental and somatic dysfunction of cervical region: Secondary | ICD-10-CM | POA: Diagnosis not present

## 2019-10-09 DIAGNOSIS — M5136 Other intervertebral disc degeneration, lumbar region: Secondary | ICD-10-CM | POA: Diagnosis not present

## 2019-10-10 DIAGNOSIS — L600 Ingrowing nail: Secondary | ICD-10-CM | POA: Diagnosis not present

## 2019-10-10 DIAGNOSIS — M2021 Hallux rigidus, right foot: Secondary | ICD-10-CM | POA: Diagnosis not present

## 2019-10-10 DIAGNOSIS — M2022 Hallux rigidus, left foot: Secondary | ICD-10-CM | POA: Diagnosis not present

## 2019-10-12 DIAGNOSIS — H401122 Primary open-angle glaucoma, left eye, moderate stage: Secondary | ICD-10-CM | POA: Diagnosis not present

## 2019-10-16 DIAGNOSIS — M5136 Other intervertebral disc degeneration, lumbar region: Secondary | ICD-10-CM | POA: Diagnosis not present

## 2019-10-16 DIAGNOSIS — M9901 Segmental and somatic dysfunction of cervical region: Secondary | ICD-10-CM | POA: Diagnosis not present

## 2019-10-16 DIAGNOSIS — M50322 Other cervical disc degeneration at C5-C6 level: Secondary | ICD-10-CM | POA: Diagnosis not present

## 2019-10-16 DIAGNOSIS — M9903 Segmental and somatic dysfunction of lumbar region: Secondary | ICD-10-CM | POA: Diagnosis not present

## 2019-10-22 ENCOUNTER — Telehealth: Payer: Self-pay | Admitting: Internal Medicine

## 2019-10-22 DIAGNOSIS — G4733 Obstructive sleep apnea (adult) (pediatric): Secondary | ICD-10-CM

## 2019-10-22 NOTE — Telephone Encounter (Signed)
ATC Patient.  Patient had called earlier requesting a new cpap mask from [place].  LM on home VM (DPR) to let Patient know cpap mask order has been placed and to call back if any questions or problems. DME order placed for new cpap mask. Nothing further at this time.   Per Dr Annamaria Boots 06/13/2019 OV- Instructions    Return in about 1 year (around 06/12/2020). We can continue CPAP 9, mask of choice, humidifier, supplies, Airview/ card  The adderall script has been changed back to allow up to 3 tabs per day, if needed.   Please call if we can help

## 2019-10-23 DIAGNOSIS — M9901 Segmental and somatic dysfunction of cervical region: Secondary | ICD-10-CM | POA: Diagnosis not present

## 2019-10-23 DIAGNOSIS — M9903 Segmental and somatic dysfunction of lumbar region: Secondary | ICD-10-CM | POA: Diagnosis not present

## 2019-10-23 DIAGNOSIS — M5136 Other intervertebral disc degeneration, lumbar region: Secondary | ICD-10-CM | POA: Diagnosis not present

## 2019-10-23 DIAGNOSIS — M50322 Other cervical disc degeneration at C5-C6 level: Secondary | ICD-10-CM | POA: Diagnosis not present

## 2019-10-30 DIAGNOSIS — M9903 Segmental and somatic dysfunction of lumbar region: Secondary | ICD-10-CM | POA: Diagnosis not present

## 2019-10-30 DIAGNOSIS — M50322 Other cervical disc degeneration at C5-C6 level: Secondary | ICD-10-CM | POA: Diagnosis not present

## 2019-10-30 DIAGNOSIS — M9901 Segmental and somatic dysfunction of cervical region: Secondary | ICD-10-CM | POA: Diagnosis not present

## 2019-10-30 DIAGNOSIS — M5136 Other intervertebral disc degeneration, lumbar region: Secondary | ICD-10-CM | POA: Diagnosis not present

## 2019-11-01 ENCOUNTER — Ambulatory Visit (INDEPENDENT_AMBULATORY_CARE_PROVIDER_SITE_OTHER): Payer: Medicare HMO | Admitting: *Deleted

## 2019-11-01 DIAGNOSIS — I495 Sick sinus syndrome: Secondary | ICD-10-CM

## 2019-11-01 LAB — CUP PACEART REMOTE DEVICE CHECK
Date Time Interrogation Session: 20201231130148
Implantable Lead Implant Date: 20170525
Implantable Lead Implant Date: 20170525
Implantable Lead Location: 753859
Implantable Lead Location: 753860
Implantable Lead Model: 377
Implantable Lead Model: 377
Implantable Lead Serial Number: 49470224
Implantable Lead Serial Number: 49485613
Implantable Pulse Generator Implant Date: 20170525
Pulse Gen Model: 394969
Pulse Gen Serial Number: 68798589

## 2019-11-02 NOTE — Progress Notes (Signed)
PPM remote 

## 2019-11-06 DIAGNOSIS — M50322 Other cervical disc degeneration at C5-C6 level: Secondary | ICD-10-CM | POA: Diagnosis not present

## 2019-11-06 DIAGNOSIS — M9901 Segmental and somatic dysfunction of cervical region: Secondary | ICD-10-CM | POA: Diagnosis not present

## 2019-11-06 DIAGNOSIS — M5136 Other intervertebral disc degeneration, lumbar region: Secondary | ICD-10-CM | POA: Diagnosis not present

## 2019-11-06 DIAGNOSIS — M9903 Segmental and somatic dysfunction of lumbar region: Secondary | ICD-10-CM | POA: Diagnosis not present

## 2019-11-07 DIAGNOSIS — L600 Ingrowing nail: Secondary | ICD-10-CM | POA: Diagnosis not present

## 2019-11-13 DIAGNOSIS — M9903 Segmental and somatic dysfunction of lumbar region: Secondary | ICD-10-CM | POA: Diagnosis not present

## 2019-11-13 DIAGNOSIS — M9901 Segmental and somatic dysfunction of cervical region: Secondary | ICD-10-CM | POA: Diagnosis not present

## 2019-11-13 DIAGNOSIS — M50322 Other cervical disc degeneration at C5-C6 level: Secondary | ICD-10-CM | POA: Diagnosis not present

## 2019-11-13 DIAGNOSIS — M5136 Other intervertebral disc degeneration, lumbar region: Secondary | ICD-10-CM | POA: Diagnosis not present

## 2019-11-14 DIAGNOSIS — G47419 Narcolepsy without cataplexy: Secondary | ICD-10-CM | POA: Diagnosis not present

## 2019-11-14 DIAGNOSIS — F5101 Primary insomnia: Secondary | ICD-10-CM | POA: Diagnosis not present

## 2019-11-14 DIAGNOSIS — G4733 Obstructive sleep apnea (adult) (pediatric): Secondary | ICD-10-CM | POA: Diagnosis not present

## 2019-11-20 DIAGNOSIS — M545 Low back pain: Secondary | ICD-10-CM | POA: Diagnosis not present

## 2019-11-20 DIAGNOSIS — M9904 Segmental and somatic dysfunction of sacral region: Secondary | ICD-10-CM | POA: Diagnosis not present

## 2019-11-20 DIAGNOSIS — M9903 Segmental and somatic dysfunction of lumbar region: Secondary | ICD-10-CM | POA: Diagnosis not present

## 2019-11-20 DIAGNOSIS — M9905 Segmental and somatic dysfunction of pelvic region: Secondary | ICD-10-CM | POA: Diagnosis not present

## 2019-11-21 DIAGNOSIS — T8189XA Other complications of procedures, not elsewhere classified, initial encounter: Secondary | ICD-10-CM | POA: Diagnosis not present

## 2019-11-26 DIAGNOSIS — E7800 Pure hypercholesterolemia, unspecified: Secondary | ICD-10-CM | POA: Diagnosis not present

## 2019-11-26 DIAGNOSIS — Z125 Encounter for screening for malignant neoplasm of prostate: Secondary | ICD-10-CM | POA: Diagnosis not present

## 2019-11-26 DIAGNOSIS — E119 Type 2 diabetes mellitus without complications: Secondary | ICD-10-CM | POA: Diagnosis not present

## 2019-11-26 DIAGNOSIS — I10 Essential (primary) hypertension: Secondary | ICD-10-CM | POA: Diagnosis not present

## 2019-11-26 DIAGNOSIS — R2689 Other abnormalities of gait and mobility: Secondary | ICD-10-CM | POA: Diagnosis not present

## 2019-11-26 DIAGNOSIS — H539 Unspecified visual disturbance: Secondary | ICD-10-CM | POA: Diagnosis not present

## 2019-11-26 DIAGNOSIS — I679 Cerebrovascular disease, unspecified: Secondary | ICD-10-CM | POA: Diagnosis not present

## 2019-11-27 DIAGNOSIS — M9904 Segmental and somatic dysfunction of sacral region: Secondary | ICD-10-CM | POA: Diagnosis not present

## 2019-11-27 DIAGNOSIS — M9903 Segmental and somatic dysfunction of lumbar region: Secondary | ICD-10-CM | POA: Diagnosis not present

## 2019-11-27 DIAGNOSIS — M9905 Segmental and somatic dysfunction of pelvic region: Secondary | ICD-10-CM | POA: Diagnosis not present

## 2019-11-27 DIAGNOSIS — M545 Low back pain: Secondary | ICD-10-CM | POA: Diagnosis not present

## 2019-12-11 ENCOUNTER — Encounter: Payer: Self-pay | Admitting: Neurology

## 2019-12-11 ENCOUNTER — Other Ambulatory Visit: Payer: Self-pay

## 2019-12-11 ENCOUNTER — Ambulatory Visit: Payer: Medicare HMO | Admitting: Neurology

## 2019-12-11 VITALS — BP 150/82 | HR 61 | Temp 97.1°F | Ht 68.0 in | Wt 239.0 lb

## 2019-12-11 DIAGNOSIS — H539 Unspecified visual disturbance: Secondary | ICD-10-CM | POA: Diagnosis not present

## 2019-12-11 DIAGNOSIS — R269 Unspecified abnormalities of gait and mobility: Secondary | ICD-10-CM | POA: Diagnosis not present

## 2019-12-11 DIAGNOSIS — Z8673 Personal history of transient ischemic attack (TIA), and cerebral infarction without residual deficits: Secondary | ICD-10-CM | POA: Diagnosis not present

## 2019-12-11 DIAGNOSIS — E119 Type 2 diabetes mellitus without complications: Secondary | ICD-10-CM | POA: Diagnosis not present

## 2019-12-11 DIAGNOSIS — R251 Tremor, unspecified: Secondary | ICD-10-CM | POA: Diagnosis not present

## 2019-12-11 NOTE — Progress Notes (Signed)
Subjective:    Patient ID: Jordan [name] is a 79 y.o. male.  HPI     Star Age, MD, PhD [facility] [address] [place], [place] [address]  Dear Dr. Kenton Kingfisher,   I saw your patient, Jordan [name], upon your kind request in my neurologic clinic today for initial consultation of his tremors, concern for parkinsonism.  The patient is accompanied by his wife today.  As you know, Jordan [name] is an 79 year old right-handed gentleman with an underlying complex medical history of coronary artery disease, hypertension, hyperlipidemia, gout, arthritis, obesity, sleep apnea, stroke, and diabetes, who reports an intermittent tremor in the right hand for the past year, he has had mild difficulty with fine motor control, thankfully no recent falls.  He has had problems with his walking with intermittent shuffling noted.  His wife reports that some days he seems to not pick up his feet very well at all.  He also indicates intermittent trouble with his memory and sometimes he feels confused transiently when he wakes up from a nap or from sleep.  Sometimes it takes him a little bit to get more oriented in his home environment.  He had some visual disturbances, he is scheduled to see an ophthalmologist.  He has had some glare while driving at night.  He does drive to [place].  He had a brain MRI without contrast on 02/17/2018 and I reviewed the results: IMPRESSION: 1. Punctate foci of acute ischemia within both frontal and parietal lobes and the right cerebellar hemisphere. No associated hemorrhage or mass effect. 2. The distribution of ischemic lesions involves multiple vascular territories and is suggestive of a central cardiac or proximal aortic embolic source. CTA of the neck may be helpful for further assessment. 3. Mild right middle cerebral artery M2 segment irregularity may indicate mild atherosclerotic disease, though the source data images suggest that  this might be artifactual. No occlusion or hemodynamically significant stenosis within the intracranial circulation. 4. Multiple old infarcts, including left frontal cortical infarct, and findings of chronic ischemic microangiopathy.  I reviewed your office note from 11/26/2019.  He reports using his CPAP.  His sleep is fairly restful.  He has not had any sudden onset of one-sided weakness or numbness recently.  Weight has been stable.  He may not always hydrate well with water.  No family history of Parkinson's disease.   His Past Medical History Is Significant For: Past Medical History:  Diagnosis Date  . Arthritis    R shoulder, bone spur  . Arthritis    "hands" (03/25/2016)  . Chronic lower back pain   . Coronary heart disease    Dr Pernell Dupre  . H/O cardiovascular stress test    perhaps last one was 2009  . Heart murmur    12/29/11 echo: mild MR, no AS, trivial TR  . History of gout   . Hyperlipidemia   . Hypertension    saw Dr. Linard Millers last earl;y- 2014, cardiac cath. last done ?2009, blocks seen didn't require any intervention at that point.  . 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 OSA on CPAP    NPSG 12-13-98 AHI 22.7  . PONV (postoperative nausea and vomiting)   . Presence of permanent cardiac pacemaker   . Shortness of breath    with exertion   . Stroke ([place])   . Type II diabetes mellitus ([place])     His Past Surgical  History Is Significant For: Past Surgical History:  Procedure Laterality Date  . CARDIAC CATHETERIZATION  2009?  . EP IMPLANTABLE DEVICE N/A 03/25/2016   Procedure: Pacemaker Implant - Dual Chamber;  Surgeon: Evans Lance, MD;  Location: [facility];  Service: Cardiovascular;  Laterality: N/A;  . FRACTURE SURGERY    . INSERT / REPLACE / REMOVE PACEMAKER  03/24/2016  . LEFT AND RIGHT HEART CATHETERIZATION WITH CORONARY ANGIOGRAM N/A 06/05/2014   Procedure: LEFT AND RIGHT HEART CATHETERIZATION WITH CORONARY ANGIOGRAM;   Surgeon: Sinclair Grooms, MD;  Location: [facility];  Service: Cardiovascular;  Laterality: N/A;  . NASAL FRACTURE SURGERY    . SHOULDER ARTHROSCOPY WITH ROTATOR CUFF REPAIR AND SUBACROMIAL DECOMPRESSION Right 08/16/2013   Procedure: SHOULDER ARTHROSCOPY WITH ROTATOR CUFF REPAIR AND SUBACROMIAL DECOMPRESSION;  Surgeon: [name], MD;  Location: [facility];  Service: Orthopedics;  Laterality: Right;  Right shoulder arthroscopy rotator cuff repair, subacromial decompression.  . TONSILLECTOMY      His Family History Is Significant For: Family History  Problem Relation Age of Onset  . Sleep apnea Sister   . Colon cancer Sister     His Social History Is Significant For: Social History   Socioeconomic History  . Marital status: Married    Spouse name: Not on file  . Number of children: Not on file  . Years of education: Not on file  . Highest education level: Not on file  Occupational History  . Occupation: Chiropodist  . Occupation: [employer]  Tobacco Use  . Smoking status: Former Smoker    Packs/day: 2.50    Years: 6.00    Pack years: 15.00    Types: Cigarettes    Quit date: 06/05/1967    Years since quitting: 52.5  . Smokeless tobacco: Never Used  Substance and Sexual Activity  . Alcohol use: Yes    Comment: "not often", not regular, on occasion   . Drug use: No  . Sexual activity: Not Currently  Other Topics Concern  . Not on file  Social History Narrative  . Not on file   Social Determinants of Health   Financial Resource Strain:   . Difficulty of Paying Living Expenses: Not on file  Food Insecurity:   . Worried About Charity fundraiser in the Last Year: Not on file  . Ran Out of Food in the Last Year: Not on file  Transportation Needs:   . Lack of Transportation (Medical): Not on file  . Lack of Transportation (Non-Medical): Not on file  Physical Activity:   . Days of Exercise per Week: Not on file  . Minutes of Exercise per  Session: Not on file  Stress:   . Feeling of Stress : Not on file  Social Connections:   . Frequency of Communication with Friends and Family: Not on file  . Frequency of Social Gatherings with Friends and Family: Not on file  . Attends Religious Services: Not on file  . Active Member of Clubs or Organizations: Not on file  . Attends Archivist Meetings: Not on file  . Marital Status: Not on file    His Allergies Are:  No Known Allergies:   His Current Medications Are:  Outpatient Encounter Medications as of 12/11/2019  Medication Sig  . amphetamine-dextroamphetamine (ADDERALL) 10 MG tablet 1-3 tabs daily as needed  . atorvastatin (LIPITOR) 40 MG tablet Take 20 mg by mouth daily before breakfast.   . clopidogrel (PLAVIX) 75  MG tablet Take 1 tablet (75 mg total) by mouth daily.  [name] glucosamine-chondroitin 500-400 MG tablet Take 2 tablets by mouth daily.  [name] lisinopril (ZESTRIL) 10 MG tablet Take 10 mg by mouth daily.  . metFORMIN (GLUCOPHAGE-XR) 500 MG 24 hr tablet Take 2,000 mg by mouth daily with breakfast.   . Multiple Vitamin (MULTIVITAMIN WITH MINERALS) TABS tablet Take 1 tablet by mouth daily.  [name] NITROSTAT 0.4 MG SL tablet PLACE 1 TABLET (0.4 MG TOTAL) UNDER THE TONGUE EVERY 5 (FIVE) MINUTES AS NEEDED FOR CHEST PAIN.  [name] Omega 3 1200 MG CAPS Take 7,200 mg by mouth daily.   . ONE TOUCH ULTRA TEST test strip Apply 1 strip topically as directed.  . [DISCONTINUED] aspirin 81 MG EC tablet Take 1 tablet (81 mg total) by mouth daily. Take only for 21 days (Patient not taking: Reported on 06/13/2019)   No facility-administered encounter medications on file as of 12/11/2019.  :   Review of Systems:  Out of a complete 14 point review of systems, all are reviewed and negative with the exception of these symptoms as listed below:    Review of Systems  Neurological:       Reports Dr. Kenton Kingfisher recommended he come for evaluation on his shuffling gait, decrease balance and tremor in  right hand and right leg.     Objective:  Neurological Exam  Physical Exam Physical Examination:   Vitals:   12/11/19 0952  BP: (!) 150/82  Pulse: 61  Temp: (!) 97.1 F (36.2 C)   General Examination: The patient is a very pleasant 79 y.o. male in no acute distress. He appears well-developed and well-nourished and well groomed.   HEENT: Normocephalic, atraumatic, pupils are equal, round and reactive to light, Extraocular tracking well preserved, face is symmetric, no obvious facial masking noted, no lip, neck or jaw tremor.  Neck is supple, no carotid bruits, airway examination reveals mild to moderate mouth dryness, otherwise tongue protrudes centrally in palate elevates symmetrically.No dysarthria or hypophonia noted.  Chest: Clear to auscultation without wheezing, rhonchi or crackles noted.  Heart: S1+S2+0, regular and normal without murmurs, rubs or gallops noted.   Abdomen: Soft, non-tender and non-distended with normal bowel sounds appreciated on auscultation.  Extremities: There is no pitting edema in the distal lower extremities bilaterally. Pedal pulses are intact.  Skin: Warm and dry without trophic changes noted.  Musculoskeletal: exam reveals no obvious joint deformities, tenderness or joint swelling or erythema.   Neurologically:  Mental status: The patient is awake, alert and oriented in all 4 spheres. His immediate and remote memory, attention, language skills and fund of knowledge are appropriate. There is no evidence of aphasia, agnosia, apraxia or anomia. Speech is clear with normal prosody and enunciation. Thought process is linear. Mood is normal and affect is normal.  Cranial nerves II - XII are as described above under HEENT exam. In addition: shoulder shrug is normal with equal shoulder height noted. Motor exam: Normal bulk, strength and tone is noted, He has a slight and intermittent resting tremor in the right hand, particularly in the thumb area, otherwise  no resting tremor noted.  Slight postural tremor in both upper extremities, no significant action tremor or intention tremor.  Fine motor skills with finger taps, hand movements, rapid alternating padding and foot taps and foot agility are mildly impaired throughout.No obvious lateralization noted.  Reflexes are 1+ in the upper extremities and absent in the lower extremities.  He stands up with mild difficulty,  initially stands wider based, he can stand narrow based, he walks without any obvious shuffling or freezing or festination, he walks slightly cautiously, he has a fairly well preserved arm swing bilaterally.  Cerebellar testing: No dysmetria or intention tremor on finger to nose testing. Sensory exam: intact to light touch.               Assessment and Plan:    In summary, ACEON SPEAR is a very pleasant 79 y.o.-year old male with an underlying complex medical history of coronary artery disease, hypertension, hyperlipidemia, gout, arthritis, obesity, sleep apnea, stroke, and diabetes, who Presents for evaluation of his intermittent tremor and gait changes over the past year.  His history and examination are not telltale for Parkinson's disease.  It is not impossible that he may have developed some vascular parkinsonism.  Nevertheless, his current presentation is not supportive of idiopathic Parkinson's disease.  I talked to the patient and his wife at length today.  We talked about the importance of secondary stroke prevention and overall healthy lifestyle.  He is encouraged to stay well-hydrated, well rested, use a cane for gait safety, and seek formal evaluation of his visual disturbance with an ophthalmologist.  He is scheduled for an appointment today as I understand.  I do not believe he needs another MRI.  He is at risk for vascular memory changes.  He has noticed intermittent confusion particularly after he first wakes up.  He is advised to try to exercise on a regular basis, exercising the  lower extremities and his gait will likely help long-term with his gait and balance.  We talked about fall prevention. I would not favor a trial of Parkinson's medication in his case.  He is advised to make a follow-up appointment if needed in stroke clinic to see Dr. Leonie Man, If needed.  He is otherwise advised to follow-up with you on a scheduled basis.  I answered all the questions today and the patient and his wife are in agreement. Thank you very much for allowing me to participate in the care of this nice patient. If I can be of any further assistance to you please do not hesitate to call me at [phone].  Sincerely,   Star Age, MD, PhD

## 2019-12-11 NOTE — Patient Instructions (Signed)
I do not see any telltale signs of Parkinson's disease.  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  Again, I do not see any signs of classic Parkinson's disease, I would not favor trying you on Parkinson's medication.  I would like for you to start using a cane for gait safety, fall prevention is Important.  Please continue to exercise on a regular basis, try to strengthen your upper and lower body and also start walking for exercise. Continue your medications as directed. As discussed, secondary prevention is key after a stroke. This means: taking care of blood sugar values or diabetes management (A1c goal of less than 7.0), good blood pressure (hypertension) control and optimizing cholesterol management (with LDL goal of less than 70), exercising daily or regularly within your own mobility limitations of course, and overall cardiovascular risk factor reduction, which includes screening for and treatment of obstructive sleep apnea (OSA) and weight management.  Should you have any sudden onset of one-sided weakness, numbness, tingling, shortness of breath, chest pain, one-sided headache or severe headache, changes in mental state, please proceed to the ER or call 911 immediately. I agree with full evaluation with an ophthalmologist.  If you have any questions regarding your stroke or TIA, you can always make an appointment with Dr. Leonie Man in stroke clinic.

## 2019-12-14 ENCOUNTER — Telehealth: Payer: Self-pay | Admitting: Internal Medicine

## 2019-12-14 MED ORDER — AMPHETAMINE-DEXTROAMPHETAMINE 10 MG PO TABS: ORAL_TABLET | ORAL | 0 refills | Status: DC

## 2019-12-14 NOTE — Telephone Encounter (Signed)
Adderall refill e-sent 

## 2019-12-14 NOTE — Telephone Encounter (Signed)
Called and spoke with pt letting him know [facility] refilled his adderall for him and he verbalized understanding.nothing further needed.

## 2019-12-14 NOTE — Telephone Encounter (Signed)
Pt requesting a refill on Adderall 10mg  tabs Last refill: 06/13/2019 # 90, take 1-3 tabs prn Last OV: 06/13/2019 Next OV:  06/12/2020  rx has been pended in chart.  Pt wishes to pick this up.  CY please advise on refill.  Thanks!

## 2020-01-31 ENCOUNTER — Ambulatory Visit (INDEPENDENT_AMBULATORY_CARE_PROVIDER_SITE_OTHER): Payer: Medicare HMO | Admitting: *Deleted

## 2020-01-31 DIAGNOSIS — Z950 Presence of cardiac pacemaker: Secondary | ICD-10-CM | POA: Diagnosis not present

## 2020-01-31 LAB — CUP PACEART REMOTE DEVICE CHECK
Battery Remaining Percentage: 70 %
Brady Statistic RA Percent Paced: 90 %
Brady Statistic RV Percent Paced: 7 %
Date Time Interrogation Session: 20210401122356
Implantable Lead Implant Date: 20170525
Implantable Lead Implant Date: 20170525
Implantable Lead Location: 753859
Implantable Lead Location: 753860
Implantable Lead Model: 377
Implantable Lead Model: 377
Implantable Lead Serial Number: 49470224
Implantable Lead Serial Number: 49485613
Implantable Pulse Generator Implant Date: 20170525
Lead Channel Impedance Value: 507 Ohm
Lead Channel Impedance Value: 527 Ohm
Lead Channel Pacing Threshold Amplitude: 1 V
Lead Channel Pacing Threshold Amplitude: 1.1 V
Lead Channel Pacing Threshold Pulse Width: 0.4 ms
Lead Channel Pacing Threshold Pulse Width: 0.4 ms
Lead Channel Sensing Intrinsic Amplitude: 10.1 mV
Lead Channel Sensing Intrinsic Amplitude: 4.8 mV
Lead Channel Setting Pacing Amplitude: 2.4 V
Lead Channel Setting Pacing Amplitude: 2.4 V
Lead Channel Setting Pacing Pulse Width: 0.4 ms
Pulse Gen Model: 394969
Pulse Gen Serial Number: 68798589

## 2020-02-01 NOTE — Progress Notes (Signed)
PPM Remote  

## 2020-02-05 DIAGNOSIS — M9904 Segmental and somatic dysfunction of sacral region: Secondary | ICD-10-CM | POA: Diagnosis not present

## 2020-02-05 DIAGNOSIS — M9905 Segmental and somatic dysfunction of pelvic region: Secondary | ICD-10-CM | POA: Diagnosis not present

## 2020-02-05 DIAGNOSIS — M9903 Segmental and somatic dysfunction of lumbar region: Secondary | ICD-10-CM | POA: Diagnosis not present

## 2020-02-05 DIAGNOSIS — M545 Low back pain: Secondary | ICD-10-CM | POA: Diagnosis not present

## 2020-02-12 DIAGNOSIS — E119 Type 2 diabetes mellitus without complications: Secondary | ICD-10-CM | POA: Diagnosis not present

## 2020-03-03 DIAGNOSIS — R972 Elevated prostate specific antigen [PSA]: Secondary | ICD-10-CM | POA: Diagnosis not present

## 2020-03-05 DIAGNOSIS — M9904 Segmental and somatic dysfunction of sacral region: Secondary | ICD-10-CM | POA: Diagnosis not present

## 2020-03-05 DIAGNOSIS — M5136 Other intervertebral disc degeneration, lumbar region: Secondary | ICD-10-CM | POA: Diagnosis not present

## 2020-03-05 DIAGNOSIS — M9903 Segmental and somatic dysfunction of lumbar region: Secondary | ICD-10-CM | POA: Diagnosis not present

## 2020-03-05 DIAGNOSIS — M9905 Segmental and somatic dysfunction of pelvic region: Secondary | ICD-10-CM | POA: Diagnosis not present

## 2020-04-09 DIAGNOSIS — M9905 Segmental and somatic dysfunction of pelvic region: Secondary | ICD-10-CM | POA: Diagnosis not present

## 2020-04-09 DIAGNOSIS — M9904 Segmental and somatic dysfunction of sacral region: Secondary | ICD-10-CM | POA: Diagnosis not present

## 2020-04-09 DIAGNOSIS — M5136 Other intervertebral disc degeneration, lumbar region: Secondary | ICD-10-CM | POA: Diagnosis not present

## 2020-04-09 DIAGNOSIS — M9903 Segmental and somatic dysfunction of lumbar region: Secondary | ICD-10-CM | POA: Diagnosis not present

## 2020-04-14 ENCOUNTER — Inpatient Hospital Stay (HOSPITAL_COMMUNITY)
Admission: EM | Admit: 2020-04-14 | Discharge: 2020-04-18 | DRG: 065 | Disposition: A | Discharge status: Rehabilitation Facility | Payer: Medicare HMO | Attending: Student | Admitting: Student

## 2020-04-14 ENCOUNTER — Observation Stay (HOSPITAL_COMMUNITY): Payer: Medicare HMO

## 2020-04-14 ENCOUNTER — Encounter (HOSPITAL_COMMUNITY): Payer: Self-pay | Admitting: Emergency Medicine

## 2020-04-14 ENCOUNTER — Other Ambulatory Visit: Payer: Self-pay

## 2020-04-14 ENCOUNTER — Emergency Department (HOSPITAL_COMMUNITY): Payer: Medicare HMO

## 2020-04-14 DIAGNOSIS — G47419 Narcolepsy without cataplexy: Secondary | ICD-10-CM | POA: Diagnosis present

## 2020-04-14 DIAGNOSIS — Z950 Presence of cardiac pacemaker: Secondary | ICD-10-CM | POA: Diagnosis not present

## 2020-04-14 DIAGNOSIS — E785 Hyperlipidemia, unspecified: Secondary | ICD-10-CM | POA: Diagnosis present

## 2020-04-14 DIAGNOSIS — E1151 Type 2 diabetes mellitus with diabetic peripheral angiopathy without gangrene: Secondary | ICD-10-CM | POA: Diagnosis not present

## 2020-04-14 DIAGNOSIS — I495 Sick sinus syndrome: Secondary | ICD-10-CM | POA: Diagnosis present

## 2020-04-14 DIAGNOSIS — D62 Acute posthemorrhagic anemia: Secondary | ICD-10-CM

## 2020-04-14 DIAGNOSIS — N179 Acute kidney failure, unspecified: Secondary | ICD-10-CM | POA: Diagnosis not present

## 2020-04-14 DIAGNOSIS — R531 Weakness: Secondary | ICD-10-CM | POA: Diagnosis not present

## 2020-04-14 DIAGNOSIS — R29818 Other symptoms and signs involving the nervous system: Secondary | ICD-10-CM | POA: Diagnosis not present

## 2020-04-14 DIAGNOSIS — R200 Anesthesia of skin: Secondary | ICD-10-CM | POA: Diagnosis not present

## 2020-04-14 DIAGNOSIS — Z7982 Long term (current) use of aspirin: Secondary | ICD-10-CM | POA: Diagnosis not present

## 2020-04-14 DIAGNOSIS — I110 Hypertensive heart disease with heart failure: Secondary | ICD-10-CM | POA: Diagnosis not present

## 2020-04-14 DIAGNOSIS — Z20822 Contact with and (suspected) exposure to covid-19: Secondary | ICD-10-CM | POA: Diagnosis not present

## 2020-04-14 DIAGNOSIS — R202 Paresthesia of skin: Secondary | ICD-10-CM | POA: Diagnosis not present

## 2020-04-14 DIAGNOSIS — R0689 Other abnormalities of breathing: Secondary | ICD-10-CM | POA: Diagnosis not present

## 2020-04-14 DIAGNOSIS — Z800 Family history of malignant neoplasm of digestive organs: Secondary | ICD-10-CM | POA: Diagnosis not present

## 2020-04-14 DIAGNOSIS — I700 Atherosclerosis of aorta: Secondary | ICD-10-CM | POA: Diagnosis present

## 2020-04-14 DIAGNOSIS — E66811 Obesity, class 1: Secondary | ICD-10-CM | POA: Diagnosis present

## 2020-04-14 DIAGNOSIS — I10 Essential (primary) hypertension: Secondary | ICD-10-CM | POA: Diagnosis not present

## 2020-04-14 DIAGNOSIS — E119 Type 2 diabetes mellitus without complications: Secondary | ICD-10-CM

## 2020-04-14 DIAGNOSIS — I2510 Atherosclerotic heart disease of native coronary artery without angina pectoris: Secondary | ICD-10-CM | POA: Diagnosis present

## 2020-04-14 DIAGNOSIS — G459 Transient cerebral ischemic attack, unspecified: Secondary | ICD-10-CM | POA: Diagnosis present

## 2020-04-14 DIAGNOSIS — Z9841 Cataract extraction status, right eye: Secondary | ICD-10-CM | POA: Diagnosis not present

## 2020-04-14 DIAGNOSIS — L989 Disorder of the skin and subcutaneous tissue, unspecified: Secondary | ICD-10-CM | POA: Diagnosis not present

## 2020-04-14 DIAGNOSIS — I272 Pulmonary hypertension, unspecified: Secondary | ICD-10-CM | POA: Diagnosis not present

## 2020-04-14 DIAGNOSIS — Z8673 Personal history of transient ischemic attack (TIA), and cerebral infarction without residual deficits: Secondary | ICD-10-CM

## 2020-04-14 DIAGNOSIS — Z79899 Other long term (current) drug therapy: Secondary | ICD-10-CM

## 2020-04-14 DIAGNOSIS — E1159 Type 2 diabetes mellitus with other circulatory complications: Secondary | ICD-10-CM

## 2020-04-14 DIAGNOSIS — I5032 Chronic diastolic (congestive) heart failure: Secondary | ICD-10-CM | POA: Diagnosis present

## 2020-04-14 DIAGNOSIS — I639 Cerebral infarction, unspecified: Secondary | ICD-10-CM | POA: Diagnosis not present

## 2020-04-14 DIAGNOSIS — R29702 NIHSS score 2: Secondary | ICD-10-CM | POA: Diagnosis present

## 2020-04-14 DIAGNOSIS — Z7984 Long term (current) use of oral hypoglycemic drugs: Secondary | ICD-10-CM

## 2020-04-14 DIAGNOSIS — Z6835 Body mass index (BMI) 35.0-35.9, adult: Secondary | ICD-10-CM

## 2020-04-14 DIAGNOSIS — Z7902 Long term (current) use of antithrombotics/antiplatelets: Secondary | ICD-10-CM

## 2020-04-14 DIAGNOSIS — G4733 Obstructive sleep apnea (adult) (pediatric): Secondary | ICD-10-CM | POA: Diagnosis not present

## 2020-04-14 DIAGNOSIS — I69351 Hemiplegia and hemiparesis following cerebral infarction affecting right dominant side: Secondary | ICD-10-CM | POA: Diagnosis not present

## 2020-04-14 DIAGNOSIS — E1169 Type 2 diabetes mellitus with other specified complication: Secondary | ICD-10-CM

## 2020-04-14 DIAGNOSIS — I351 Nonrheumatic aortic (valve) insufficiency: Secondary | ICD-10-CM | POA: Diagnosis not present

## 2020-04-14 DIAGNOSIS — R05 Cough: Secondary | ICD-10-CM | POA: Diagnosis not present

## 2020-04-14 DIAGNOSIS — Z87891 Personal history of nicotine dependence: Secondary | ICD-10-CM | POA: Diagnosis not present

## 2020-04-14 DIAGNOSIS — E1165 Type 2 diabetes mellitus with hyperglycemia: Secondary | ICD-10-CM | POA: Diagnosis not present

## 2020-04-14 DIAGNOSIS — I6381 Other cerebral infarction due to occlusion or stenosis of small artery: Secondary | ICD-10-CM | POA: Diagnosis present

## 2020-04-14 DIAGNOSIS — Z9842 Cataract extraction status, left eye: Secondary | ICD-10-CM | POA: Diagnosis not present

## 2020-04-14 DIAGNOSIS — E46 Unspecified protein-calorie malnutrition: Secondary | ICD-10-CM | POA: Diagnosis not present

## 2020-04-14 DIAGNOSIS — E8809 Other disorders of plasma-protein metabolism, not elsewhere classified: Secondary | ICD-10-CM | POA: Diagnosis not present

## 2020-04-14 DIAGNOSIS — E7849 Other hyperlipidemia: Secondary | ICD-10-CM | POA: Diagnosis not present

## 2020-04-14 DIAGNOSIS — R7309 Other abnormal glucose: Secondary | ICD-10-CM | POA: Diagnosis not present

## 2020-04-14 DIAGNOSIS — E669 Obesity, unspecified: Secondary | ICD-10-CM | POA: Diagnosis present

## 2020-04-14 LAB — URINALYSIS, ROUTINE W REFLEX MICROSCOPIC
Bacteria, UA: NONE SEEN
Bilirubin Urine: NEGATIVE
Glucose, UA: NEGATIVE mg/dL
Hgb urine dipstick: NEGATIVE
Ketones, ur: NEGATIVE mg/dL
Leukocytes,Ua: NEGATIVE
Nitrite: NEGATIVE
Protein, ur: NEGATIVE mg/dL
Specific Gravity, Urine: 1.014 (ref 1.005–1.030)
pH: 5 (ref 5.0–8.0)

## 2020-04-14 LAB — COMPREHENSIVE METABOLIC PANEL
ALT: 23 U/L (ref 0–44)
AST: 33 U/L (ref 15–41)
Albumin: 3.5 g/dL (ref 3.5–5.0)
Alkaline Phosphatase: 59 U/L (ref 38–126)
Anion gap: 10 (ref 5–15)
BUN: 12 mg/dL (ref 8–23)
CO2: 21 mmol/L — ABNORMAL LOW (ref 22–32)
Calcium: 9.4 mg/dL (ref 8.9–10.3)
Chloride: 108 mmol/L (ref 98–111)
Creatinine, Ser: 1.01 mg/dL (ref 0.61–1.24)
GFR calc Af Amer: >60 mL/min (ref >60)
GFR calc non Af Amer: >60 mL/min (ref >60)
Glucose, Bld: 183 mg/dL — ABNORMAL HIGH (ref 70–99)
Potassium: 4 mmol/L (ref 3.5–5.1)
Sodium: 139 mmol/L (ref 135–145)
Total Bilirubin: 0.8 mg/dL (ref 0.3–1.2)
Total Protein: 6.6 g/dL (ref 6.5–8.1)

## 2020-04-14 LAB — APTT: aPTT: 26 seconds (ref 24–36)

## 2020-04-14 LAB — PROTIME-INR
INR: 1.1 (ref 0.8–1.2)
Prothrombin Time: 14.1 seconds (ref 11.4–15.2)

## 2020-04-14 LAB — DIFFERENTIAL
Abs Immature Granulocytes: 0.03 10*3/uL (ref 0.00–0.07)
Basophils Absolute: 0.1 10*3/uL (ref 0.0–0.1)
Basophils Relative: 1 %
Eosinophils Absolute: 0.4 10*3/uL (ref 0.0–0.5)
Eosinophils Relative: 5 %
Immature Granulocytes: 0 %
Lymphocytes Relative: 27 %
Lymphs Abs: 2.3 10*3/uL (ref 0.7–4.0)
Monocytes Absolute: 0.8 10*3/uL (ref 0.1–1.0)
Monocytes Relative: 9 %
Neutro Abs: 4.7 10*3/uL (ref 1.7–7.7)
Neutrophils Relative %: 58 %

## 2020-04-14 LAB — CBC
HCT: 40.2 % (ref 39.0–52.0)
Hemoglobin: 13.2 g/dL (ref 13.0–17.0)
MCH: 29.1 pg (ref 26.0–34.0)
MCHC: 32.8 g/dL (ref 30.0–36.0)
MCV: 88.7 fL (ref 80.0–100.0)
Platelets: 282 10*3/uL (ref 150–400)
RBC: 4.53 MIL/uL (ref 4.22–5.81)
RDW: 13.6 % (ref 11.5–15.5)
WBC: 8.4 10*3/uL (ref 4.0–10.5)
nRBC: 0 % (ref 0.0–0.2)

## 2020-04-14 LAB — I-STAT CHEM 8, ED
BUN: 11 mg/dL (ref 8–23)
Calcium, Ion: 1.2 mmol/L (ref 1.15–1.40)
Chloride: 107 mmol/L (ref 98–111)
Creatinine, Ser: 0.9 mg/dL (ref 0.61–1.24)
Glucose, Bld: 181 mg/dL — ABNORMAL HIGH (ref 70–99)
HCT: 38 % — ABNORMAL LOW (ref 39.0–52.0)
Hemoglobin: 12.9 g/dL — ABNORMAL LOW (ref 13.0–17.0)
Potassium: 3.9 mmol/L (ref 3.5–5.1)
Sodium: 141 mmol/L (ref 135–145)
TCO2: 20 mmol/L — ABNORMAL LOW (ref 22–32)

## 2020-04-14 LAB — HEMOGLOBIN A1C
Hgb A1c MFr Bld: 7.9 % — ABNORMAL HIGH (ref 4.8–5.6)
Mean Plasma Glucose: 180.03 mg/dL

## 2020-04-14 LAB — SARS CORONAVIRUS 2 BY RT PCR (HOSPITAL ORDER, PERFORMED IN ~~LOC~~ HOSPITAL LAB): SARS Coronavirus 2: NEGATIVE

## 2020-04-14 LAB — CBG MONITORING, ED
Glucose-Capillary: 158 mg/dL — ABNORMAL HIGH (ref 70–99)
Glucose-Capillary: 140 mg/dL — ABNORMAL HIGH (ref 70–99)
Glucose-Capillary: 132 mg/dL — ABNORMAL HIGH (ref 70–99)

## 2020-04-14 LAB — RAPID URINE DRUG SCREEN, HOSP PERFORMED
Amphetamines: NOT DETECTED
Barbiturates: NOT DETECTED
Benzodiazepines: NOT DETECTED
Cocaine: NOT DETECTED
Opiates: NOT DETECTED
Tetrahydrocannabinol: NOT DETECTED

## 2020-04-14 LAB — TSH: TSH: 0.976 u[IU]/mL (ref 0.350–4.500)

## 2020-04-14 LAB — ETHANOL: Alcohol, Ethyl (B): <10 mg/dL (ref <10)

## 2020-04-14 MED ORDER — CLOPIDOGREL BISULFATE 75 MG PO TABS
75.0000 mg | ORAL_TABLET | Freq: Every day | ORAL | Status: DC
  Administered 2020-04-15 – 2020-04-18 (×4): 75 mg via ORAL
  Filled 2020-04-14 (×4): qty 1

## 2020-04-14 MED ORDER — ACETAMINOPHEN 325 MG PO TABS: 650.0000 mg | ORAL_TABLET | ORAL | Status: DC | PRN

## 2020-04-14 MED ORDER — ATORVASTATIN CALCIUM 40 MG PO TABS
40.0000 mg | ORAL_TABLET | Freq: Every day | ORAL | Status: DC
  Administered 2020-04-14 – 2020-04-18 (×5): 40 mg via ORAL
  Filled 2020-04-14 (×5): qty 1

## 2020-04-14 MED ORDER — ACETAMINOPHEN 650 MG RE SUPP: 650.0000 mg | RECTAL | Status: DC | PRN

## 2020-04-14 MED ORDER — INSULIN ASPART 100 UNIT/ML ~~LOC~~ SOLN
0.0000 [IU] | Freq: Three times a day (TID) | SUBCUTANEOUS | Status: DC
  Administered 2020-04-14 – 2020-04-16 (×5): 2 [IU] via SUBCUTANEOUS
  Administered 2020-04-16 (×2): 3 [IU] via SUBCUTANEOUS
  Administered 2020-04-17: 2 [IU] via SUBCUTANEOUS
  Administered 2020-04-17 – 2020-04-18 (×3): 3 [IU] via SUBCUTANEOUS
  Administered 2020-04-18: 2 [IU] via SUBCUTANEOUS

## 2020-04-14 MED ORDER — STROKE: EARLY STAGES OF RECOVERY BOOK
Freq: Once | Status: AC
  Filled 2020-04-14: qty 1

## 2020-04-14 MED ORDER — SENNOSIDES-DOCUSATE SODIUM 8.6-50 MG PO TABS: 1.0000 | ORAL_TABLET | Freq: Every evening | ORAL | Status: DC | PRN

## 2020-04-14 MED ORDER — INSULIN ASPART 100 UNIT/ML ~~LOC~~ SOLN: 0.0000 [IU] | Freq: Every day | SUBCUTANEOUS | Status: DC

## 2020-04-14 MED ORDER — ACETAMINOPHEN 160 MG/5ML PO SOLN: 650.0000 mg | ORAL | Status: DC | PRN

## 2020-04-14 MED ORDER — SODIUM CHLORIDE 0.9 % IV SOLN: INTRAVENOUS | Status: DC

## 2020-04-14 MED ORDER — LORAZEPAM 2 MG/ML IJ SOLN
1.0000 mg | Freq: Once | INTRAMUSCULAR | Status: AC
  Administered 2020-04-14: 1 mg via INTRAVENOUS
  Filled 2020-04-14: qty 1

## 2020-04-14 MED ORDER — ENOXAPARIN SODIUM 40 MG/0.4ML ~~LOC~~ SOLN
40.0000 mg | SUBCUTANEOUS | Status: DC
  Administered 2020-04-14 – 2020-04-17 (×4): 40 mg via SUBCUTANEOUS
  Filled 2020-04-14 (×4): qty 0.4

## 2020-04-14 MED ORDER — IOHEXOL 350 MG/ML SOLN
75.0000 mL | Freq: Once | INTRAVENOUS | Status: AC | PRN
  Administered 2020-04-14: 75 mL via INTRAVENOUS

## 2020-04-14 NOTE — Progress Notes (Signed)
Placed patient on CPAP via nasal mask, 9.0 cm H2 per pt home settings.  Tolerating well at this time. RN aware.

## 2020-04-14 NOTE — H&P (Signed)
History and Physical    Jordan [name] QQI:[id number] DOB: 11/06/1940 DOA: 04/14/2020  PCP: [name], MD Consultants:  Lovena Le - EP; Young - pulmonology; Rexene Alberts - neurology; Junious Silk - urology Patient coming from:  Home - lives with wife (sometimes) and son; NOK: Wife, [phone]  Chief Complaint: RUE numbness/tingling  HPI: Jordan [name] is a 79 y.o. male with medical history significant of DM; CVA: OSA on CPAP; narcolepsy; pacemaker; HTN; HLD; and CAD presenting with R-sided numbness/tingling.  He reports that he was okay this AM.  He went over to care for some animals and then an 830 meeting with friends at a coffee shop.  He went to [facility] and then to the [facility] when he suddenly realized that he couldn't full work his right side, different from the left.  It didn't get better and he decided to call 911.  He could move his arm but not as much.  He did not notice leg symptoms.  His arm felt like it was going to sleep; it continues to feel the same way.  He didn't notice pain.  What he thought he was doing with his right hand wasn't happening.  He dropped his keys, thinking they were grasped in the head - but they had fallen to the floor.   No dysphagia or dysarthria.  He headache.  H/o CVA in 2019.   ED Course:  [facility] -> R arm paresthesia.  CT negative.  Neurology recommends TIA evaluation.  Review of Systems: As per HPI; otherwise review of systems reviewed and negative.   Ambulatory Status:  Ambulates without assistance  COVID Vaccine Status:   Complete  Past Medical History:  Diagnosis Date  . Arthritis    R shoulder, bone spur  . Arthritis    "hands" (03/25/2016)  . Chronic lower back pain   . Coronary heart disease    Dr Pernell Dupre  . H/O cardiovascular stress test    perhaps last one was 2009  . Heart murmur    12/29/11 echo: mild MR, no AS, trivial TR  . History of gout   . Hyperlipidemia   . Hypertension    saw Dr. Linard Millers last earl;y- 2014, cardiac cath. last  done ?2009, blocks seen didn't require any intervention at that point.  . Narcolepsy    [name] 04-11-97; Mean Latency 1.40min, SOREM 2  . OSA on CPAP    NPSG 12-13-98 AHI 22.7  . PONV (postoperative nausea and vomiting)   . Presence of permanent cardiac pacemaker   . Shortness of breath    with exertion   . Stroke ([place])   . Type II diabetes mellitus ([place])     Past Surgical History:  Procedure Laterality Date  . CARDIAC CATHETERIZATION  2009?  . EP IMPLANTABLE DEVICE N/A 03/25/2016   Procedure: Pacemaker Implant - Dual Chamber;  Surgeon: Evans Lance, MD;  Location: [place] [facility];  Service: Cardiovascular;  Laterality: N/A;  . FRACTURE SURGERY    . INSERT / REPLACE / REMOVE PACEMAKER  03/24/2016  . LEFT AND RIGHT HEART CATHETERIZATION WITH CORONARY ANGIOGRAM N/A 06/05/2014   Procedure: LEFT AND RIGHT HEART CATHETERIZATION WITH CORONARY ANGIOGRAM;  Surgeon: Sinclair Grooms, MD;  Location: [facility] CATH LAB;  Service: Cardiovascular;  Laterality: N/A;  . NASAL FRACTURE SURGERY    . SHOULDER ARTHROSCOPY WITH ROTATOR CUFF REPAIR AND SUBACROMIAL DECOMPRESSION Right 08/16/2013   Procedure: SHOULDER ARTHROSCOPY WITH ROTATOR CUFF REPAIR AND SUBACROMIAL DECOMPRESSION;  Surgeon: [name],  MD;  Location: [place];  Service: Orthopedics;  Laterality: Right;  Right shoulder arthroscopy rotator cuff repair, subacromial decompression.  . TONSILLECTOMY      Social History   Socioeconomic History  . Marital status: Married    Spouse name: Not on file  . Number of children: Not on file  . Years of education: Not on file  . Highest education level: Not on file  Occupational History  . Occupation: Chiropodist  . Occupation: [place] [employer]  Tobacco Use  . Smoking status: Former Smoker    Packs/day: 2.50    Years: 6.00    Pack years: 15.00    Types: Cigarettes    Quit date: 06/05/1967    Years since quitting: 52.8  . Smokeless tobacco: Never Used  Substance and Sexual  Activity  . Alcohol use: Yes    Comment: rare  . Drug use: No  . Sexual activity: Not Currently  Other Topics Concern  . Not on file  Social History Narrative  . Not on file   Social Determinants of Health   Financial Resource Strain:   . Difficulty of Paying Living Expenses:   Food Insecurity:   . Worried About Charity fundraiser in the Last Year:   . Arboriculturist in the Last Year:   Transportation Needs:   . Film/video editor (Medical):   [name] Lack of Transportation (Non-Medical):   Physical Activity:   . Days of Exercise per Week:   . Minutes of Exercise per Session:   Stress:   . Feeling of Stress :   Social Connections:   . Frequency of Communication with Friends and Family:   . Frequency of Social Gatherings with Friends and Family:   . Attends Religious Services:   . Active Member of Clubs or Organizations:   . Attends Archivist Meetings:   [name] Marital Status:   Intimate Partner Violence:   . Fear of Current or Ex-Partner:   . Emotionally Abused:   [name] Physically Abused:   . Sexually Abused:     No Known Allergies  Family History  Problem Relation Age of Onset  . Sleep apnea Sister   . Colon cancer Sister     Prior to Admission medications   Medication Sig Start Date End Date Taking? Authorizing Provider  amphetamine-dextroamphetamine (ADDERALL) 10 MG tablet 1-3 tabs daily as needed 12/14/19   Baird Lyons D, MD  atorvastatin (LIPITOR) 40 MG tablet Take 20 mg by mouth daily before breakfast.     [provider]  clopidogrel (PLAVIX) 75 MG tablet Take 1 tablet (75 mg total) by mouth daily. 02/18/18   Georgette Shell, MD  glucosamine-chondroitin 500-400 MG tablet Take 2 tablets by mouth daily.    [provider]  lisinopril (ZESTRIL) 10 MG tablet Take 10 mg by mouth daily. 03/15/19   [provider]  metFORMIN (GLUCOPHAGE-XR) 500 MG 24 hr tablet Take 2,000 mg by mouth daily with breakfast.  03/01/18   [provider]  Multiple Vitamin (MULTIVITAMIN WITH MINERALS) TABS tablet Take 1 tablet by mouth daily. 02/19/18   Georgette Shell, MD  NITROSTAT 0.4 MG SL tablet PLACE 1 TABLET (0.4 MG TOTAL) UNDER THE TONGUE EVERY 5 (FIVE) MINUTES AS NEEDED FOR CHEST PAIN. 03/31/16   Belva Crome, MD  Omega 3 1200 MG CAPS Take 7,200 mg by mouth daily.     [provider]  ONE TOUCH ULTRA TEST test strip Apply 1  strip topically as directed. 12/29/15   [provider]    Physical Exam: Vitals:   04/14/20 1320 04/14/20 1330 04/14/20 1345 04/14/20 1400  BP: (!) 159/100 (!) 168/89 (!) 170/92 (!) 170/98  Pulse: 72 63 63 64  Resp: (!) 21 17 17  (!) 21  Temp:      TempSrc:      SpO2: 96% 92% 93% 95%  Weight:      Height:         . General:  Appears calm and comfortable and is NAD; eating throughout evaluation . Eyes:  PERRL, EOMI, normal lids, iris . ENT:  grossly normal hearing, lips & tongue, mmm . Neck:  no LAD, masses or thyromegaly . Cardiovascular:  RRR, no m/r/g. No LE edema.  [name] Respiratory:   CTA bilaterally with no wheezes/rales/rhonchi.  Normal respiratory effort. . Abdomen:  soft, NT, ND, NABS . Back:   normal alignment, no CVAT . Skin:  no rash or induration seen on limited exam . Musculoskeletal:  grossly normal tone BUE/BLE, good ROM, no bony abnormality . Psychiatric:  grossly normal mood and affect, speech fluent and appropriate, AOx3 . Neurologic:  CN 2-12 grossly intact, moves all extremities in coordinated fashion, sensation impaired on entire R face and body    Radiological Exams on Admission: CT HEAD CODE STROKE WO CONTRAST  Result Date: 04/14/2020 CLINICAL DATA:  Code stroke.  Right arm weakness. EXAM: CT HEAD WITHOUT CONTRAST TECHNIQUE: Contiguous axial images were obtained from the base of the skull through the vertex without intravenous contrast. COMPARISON:  Head CT 02/16/2018 and MRI 02/17/2018 FINDINGS: Brain: There is no evidence of acute infarct,  intracranial hemorrhage, mass, midline shift, or extra-axial fluid collection. Small chronic infarcts in the anterior left frontal lobe, medial right occipital lobe, and cerebellum are unchanged. Hypodensities in the cerebral white matter bilaterally are unchanged and nonspecific but compatible with mild chronic small vessel ischemic disease. There is mild cerebral atrophy. Vascular: Calcified atherosclerosis at the skull base. No hyperdense vessel. Skull: No fracture or suspicious osseous lesion. Sinuses/Orbits: New complete opacification of the left frontal sinus and left anterior ethmoid air cells. Near complete opacification of the included portion of the left maxillary sinus with osseous sinus wall thickening. Clear mastoid air cells. Bilateral cataract extraction. Other: None. ASPECTS [facility] Stroke Program Early CT Score) - Ganglionic level infarction (caudate, lentiform nuclei, internal capsule, insula, M1-M3 cortex): 7 - Supraganglionic infarction (M4-M6 cortex): 3 Total score (0-10 with 10 being normal): 10 IMPRESSION: 1. No evidence of acute intracranial abnormality. 2. ASPECTS is 10. 3. 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 4. Chronic appearing left-sided sinusitis in an ostiomeatal unit obstruction pattern. These results were called by telephone at the time of interpretation on 04/14/2020 at 12:55 pm to Dr. Orlena Sheldon, who verbally acknowledged these results. Electronically Signed   By: Logan Bores M.D.   On: 04/14/2020 12:56    EKG: Independently reviewed.  NSR with rate 64;LVH;  nonspecific ST changes with no evidence of acute ischemia   Labs on Admission: I have personally reviewed the available labs and imaging studies at the time of the admission.  Pertinent labs:   Glucose 183 Normal CBC INR 1.1 ETOH <10   Assessment/Plan Principal Problem:   TIA (transient ischemic attack) Active Problems:   HLD (hyperlipidemia)   OBESITY   Obstructive  sleep apnea   Narcolepsy without cataplexy   Essential hypertension   Presence of permanent cardiac pacemaker   DM2 (  diabetes mellitus, type 2) ([place])   TIA -Patient presenting with primarily sensory deficits of the R face and body, with possibly motor involvement of the R hand -Concerning for TIA/CVA -Will place in observation status for CVA/TIA evaluation -Telemetry monitoring -MRI -CTA head and neck -Echo -Risk stratification with FLP, A1c; will also check TSH and UDS -Patient takes Plavix daily, consider transition to ASA, addition of ASA, or Aggrenox -Neurology consult -PT/OT/ST/Nutrition Consults  HTN -Allow permissive HTN for now -Treat BP only if >220/120, and then with goal of 15% reduction -Hold ACE and plan to restart in 48-72 hours   HLD -Check FLP -Resume statin but will increase Lipitor from 20 to 40 mg daily   DM -Check A1c -Hold home PO medication (Metformin) -Will order moderate-scale SSI  Narcolepsy -Hold Adderall - he will not be performing tasks that require attention and focus as an inpatient  OSA -Continue CPAP  Pacemaker placement -Biotronik MRI compatible pacer placed by Dr. Lovena Le in 03/2016  Obesity -Body mass index is 35.97 kg/m -Weight loss should be encouraged -Outpatient PCP/bariatric medicine f/u encouraged    Note: This patient has been tested and is pending for the novel coronavirus COVID-19.      DVT prophylaxis:  Lovenox  Code Status: Full - confirmed with patient/family Family Communication: Son present throughout evaluation Disposition Plan:  The patient is from: home  Anticipated d/c is to: home without [facility] services  Anticipated d/c date will depend on clinical response to treatment, but possibly as early as tomorrow if he has excellent response to treatment  Patient is currently: acutely ill Consults called: Neurology; PT/OT/ST/Nutrition  Admission status: It is my clinical opinion that referral for OBSERVATION is  reasonable and necessary in this patient based on the above information provided. The aforementioned taken together are felt to place the patient at high risk for further clinical deterioration. However it is anticipated that the patient may be medically stable for discharge from the hospital within 24 to 48 hours.      Karmen Bongo MD [facility]   How to contact the [facility] Attending or Consulting provider [place] or covering provider during after hours [place], for this patient?  1. Check the care team in [facility] and look for a) attending/consulting TRH provider listed and b) the [facility] team listed 2. Log into [email or web address] and use [place]'s universal password to access. If you do not have the password, please contact the hospital operator. 3. Locate the [facility] provider you are looking for under [facility] and page to a number that you can be directly reached. 4. If you still have difficulty reaching the provider, please page the [facility] (Director on Call) for the Hospitalists listed on amion for assistance.   04/14/2020, 2:59 PM

## 2020-04-14 NOTE — Code Documentation (Signed)
Patient at the [facility] with his son when around 51 (LKW) he noticed some right sided numbness and tingling in his arm. He was in the bathroom and told his son to go get help. [facility] arrived and brought pt to [facility]. He was met by Stroke team, RN, and Neurologist and taken to CT. NIHSS 2 for sensory deficit and slight drift in right arm. See documentation. Pt within the TPA window until 1500. Hx: TIA, on Plavix.  Pt's young son is at the bedside. Pt's wife is in [place] at this time and will be notified. Care Plan: He is "too good to treat" at this time but will monitor Q15 min VS and Q30 min Neuro checks until outside the TPA window. Routine MRI. Hand off with Maudie Mercury, RN. Lyberti Thrush, Rande Brunt, RN, BSN, [employer]

## 2020-04-14 NOTE — ED Notes (Signed)
Patient transported to CT scan . 

## 2020-04-14 NOTE — ED Provider Notes (Addendum)
[facility] EMERGENCY DEPARTMENT Provider Note   CSN: [id number] Arrival date & time: 04/14/20  1233  An emergency department physician performed an initial assessment on this suspected stroke patient at 1249.  History No chief complaint on file.   Jordan [name] is a 79 y.o. male.  79 year old male who presents with sudden onset of right upper extremity paresthesias that occurred while he was at the gym today.  Denied any headache or visual changes.  No speech or swallowing difficulties.  Denies any trouble with ambulating does not feel off balance.  No weakness in the legs.  Called EMS was transported here        Past Medical History:  Diagnosis Date  . Arthritis    R shoulder, bone spur  . Arthritis    "hands" (03/25/2016)  . Chronic lower back pain   . Coronary heart disease    Dr Pernell Dupre  . H/O cardiovascular stress test    perhaps last one was 2009  . Heart murmur    12/29/11 echo: mild MR, no AS, trivial TR  . History of gout   . Hyperlipidemia   . Hypertension    saw Dr. Linard Millers last earl;y- 2014, cardiac cath. last done ?2009, blocks seen didn't require any intervention at that point.  . Narcolepsy    [name] 04-11-97; Mean Latency 1.29min, SOREM 2  . OSA on CPAP    NPSG 12-13-98 AHI 22.7  . PONV (postoperative nausea and vomiting)   . Presence of permanent cardiac pacemaker   . Shortness of breath    with exertion   . Stroke ([place])   . Type II diabetes mellitus [facility])     Patient Active Problem List   Diagnosis Date Noted  . 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 TIA (transient ischemic attack) 02/17/2018  . DM2 (diabetes mellitus, type 2) ([place]) 02/17/2018  . Insomnia 02/01/2018  . 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 Dyspnea on exertion 03/03/2014  . HLD (hyperlipidemia) 12/13/2007  . Essential hypertension 12/13/2007  . OBESITY 12/05/2007    . Obstructive sleep apnea 12/05/2007  . 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 Coronary atherosclerosis 12/05/2007    Past Surgical History:  Procedure Laterality Date  . CARDIAC CATHETERIZATION  2009?  . EP IMPLANTABLE DEVICE N/A 03/25/2016   Procedure: Pacemaker Implant - Dual Chamber;  Surgeon: Evans Lance, MD;  Location: [facility] CV LAB;  Service: Cardiovascular;  Laterality: N/A;  . FRACTURE SURGERY    . INSERT / REPLACE / REMOVE PACEMAKER  03/24/2016  . LEFT AND RIGHT HEART CATHETERIZATION WITH CORONARY ANGIOGRAM N/A 06/05/2014   Procedure: LEFT AND RIGHT HEART CATHETERIZATION WITH CORONARY ANGIOGRAM;  Surgeon: Sinclair Grooms, MD;  Location: [facility] CATH LAB;  Service: Cardiovascular;  Laterality: N/A;  . NASAL FRACTURE SURGERY    . SHOULDER ARTHROSCOPY WITH ROTATOR CUFF REPAIR AND SUBACROMIAL DECOMPRESSION Right 08/16/2013   Procedure: SHOULDER ARTHROSCOPY WITH ROTATOR CUFF REPAIR AND SUBACROMIAL DECOMPRESSION;  Surgeon: [name], MD;  Location: [facility];  Service: Orthopedics;  Laterality: Right;  Right shoulder arthroscopy rotator cuff repair, subacromial decompression.  . TONSILLECTOMY         Family History  Problem Relation Age of Onset  . Sleep apnea Sister   . Colon cancer Sister     Social History   Tobacco Use  . Smoking status: Former Smoker    Packs/day: 2.50  Years: 6.00    Pack years: 15.00    Types: Cigarettes    Quit date: 06/05/1967    Years since quitting: 52.8  . Smokeless tobacco: Never Used  Substance Use Topics  . Alcohol use: Yes    Comment: "not often", not regular, on occasion   . Drug use: No    Home Medications Prior to Admission medications   Medication Sig Start Date End Date Taking? Authorizing Provider  amphetamine-dextroamphetamine (ADDERALL) 10 MG tablet 1-3 tabs daily as needed 12/14/19   Baird Lyons D, MD  atorvastatin (LIPITOR) 40 MG tablet Take 20 mg by mouth daily before breakfast.     [provider]  clopidogrel (PLAVIX) 75 MG tablet Take 1 tablet (75 mg total) by mouth daily. 02/18/18   Georgette Shell, MD  glucosamine-chondroitin 500-400 MG tablet Take 2 tablets by mouth daily.    [provider]  lisinopril (ZESTRIL) 10 MG tablet Take 10 mg by mouth daily. 03/15/19   [provider]  metFORMIN (GLUCOPHAGE-XR) 500 MG 24 hr tablet Take 2,000 mg by mouth daily with breakfast.  03/01/18   [provider]  Multiple Vitamin (MULTIVITAMIN WITH MINERALS) TABS tablet Take 1 tablet by mouth daily. 02/19/18   Georgette Shell, MD  NITROSTAT 0.4 MG SL tablet PLACE 1 TABLET (0.4 MG TOTAL) UNDER THE TONGUE EVERY 5 (FIVE) MINUTES AS NEEDED FOR CHEST PAIN. 03/31/16   Belva Crome, MD  Omega 3 1200 MG CAPS Take 7,200 mg by mouth daily.     [provider]  ONE TOUCH ULTRA TEST test strip Apply 1 strip topically as directed. 12/29/15   [provider]    Allergies    Patient has no known allergies.  Review of Systems   Review of Systems  All other systems reviewed and are negative.   Physical Exam Updated Vital Signs Wt 107.3 kg   BMI 35.97 kg/m   Physical Exam Vitals and nursing note reviewed.  Constitutional:      General: He is not in acute distress.    Appearance: Normal appearance. He is well-developed. He is not toxic-appearing.  HENT:     Head: Normocephalic and atraumatic.  Eyes:     General: Lids are normal.     Conjunctiva/sclera: Conjunctivae normal.     Pupils: Pupils are equal, round, and reactive to light.  Neck:     Thyroid: No thyroid mass.     Trachea: No tracheal deviation.  Cardiovascular:     Rate and Rhythm: Normal rate and regular rhythm.     Heart sounds: Normal heart sounds. No murmur heard.  No gallop.   Pulmonary:     Effort: Pulmonary effort is normal. No respiratory distress.     Breath sounds: Normal breath sounds. No stridor. No decreased breath sounds, wheezing, rhonchi or rales.    Abdominal:     General: Bowel sounds are normal. There is no distension.     Palpations: Abdomen is soft.     Tenderness: There is no abdominal tenderness. There is no rebound.  Musculoskeletal:        General: No tenderness. Normal range of motion.     Cervical back: Normal range of motion and neck supple.  Skin:    General: Skin is warm and dry.     Findings: No abrasion or rash.  Neurological:     Mental Status: He is alert and oriented to person, place, and time.     GCS: GCS  eye subscore is 4. GCS verbal subscore is 5. GCS motor subscore is 6.     Cranial Nerves: No cranial nerve deficit.     Sensory: No sensory deficit.     Motor: No tremor.     Comments: Some right upper extremity drift noted.  No facial asymmetry.  Speech is normal.  Lower extremity strength is symmetric  Psychiatric:        Speech: Speech normal.        Behavior: Behavior normal.     ED Results / Procedures / Treatments   Labs (all labs ordered are listed, but only abnormal results are displayed) Labs Reviewed  CBG MONITORING, ED - Abnormal; Notable for the following components:      Result Value   Glucose-Capillary 158 (*)    All other components within normal limits  I-STAT CHEM 8, ED - Abnormal; Notable for the following components:   Glucose, Bld 181 (*)    TCO2 20 (*)    Hemoglobin 12.9 (*)    HCT 38.0 (*)    All other components within normal limits  CBC  DIFFERENTIAL  ETHANOL  PROTIME-INR  APTT  COMPREHENSIVE METABOLIC PANEL  RAPID URINE DRUG SCREEN, HOSP PERFORMED  URINALYSIS, ROUTINE W REFLEX MICROSCOPIC    EKG EKG Interpretation  Date/Time:  Monday April 14 2020 13:11:29 EDT Ventricular Rate:  64 PR Interval:    QRS Duration: 143 QT Interval:  402 QTC Calculation: 415 R Axis:   -49 Text Interpretation: Sinus or ectopic atrial rhythm RBBB and LAFB Left ventricular hypertrophy Lateral infarct, age indeterminate Confirmed by Lacretia Leigh ([id number]) on 04/14/2020 1:15:03  PM   Radiology CT HEAD CODE STROKE WO CONTRAST  Result Date: 04/14/2020 CLINICAL DATA:  Code stroke.  Right arm weakness. EXAM: CT HEAD WITHOUT CONTRAST TECHNIQUE: Contiguous axial images were obtained from the base of the skull through the vertex without intravenous contrast. COMPARISON:  Head CT 02/16/2018 and MRI 02/17/2018 FINDINGS: Brain: There is no evidence of acute infarct, intracranial hemorrhage, mass, midline shift, or extra-axial fluid collection. Small chronic infarcts in the anterior left frontal lobe, medial right occipital lobe, and cerebellum are unchanged. Hypodensities in the cerebral white matter bilaterally are unchanged and nonspecific but compatible with mild chronic small vessel ischemic disease. There is mild cerebral atrophy. Vascular: Calcified atherosclerosis at the skull base. No hyperdense vessel. Skull: No fracture or suspicious osseous lesion. Sinuses/Orbits: New complete opacification of the left frontal sinus and left anterior ethmoid air cells. Near complete opacification of the included portion of the left maxillary sinus with osseous sinus wall thickening. Clear mastoid air cells. Bilateral cataract extraction. Other: None. ASPECTS [facility] Stroke Program Early CT Score) - Ganglionic level infarction (caudate, lentiform nuclei, internal capsule, insula, M1-M3 cortex): 7 - Supraganglionic infarction (M4-M6 cortex): 3 Total score (0-10 with 10 being normal): 10 IMPRESSION: 1. No evidence of acute intracranial abnormality. 2. ASPECTS is 10. 3. 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 4. Chronic appearing left-sided sinusitis in an ostiomeatal unit obstruction pattern. These results were called by telephone at the time of interpretation on 04/14/2020 at 12:55 pm to Dr. Orlena Sheldon, who verbally acknowledged these results. Electronically Signed   By: Logan Bores M.D.   On: 04/14/2020 12:56    Procedures Procedures (including critical care  time)  Medications Ordered in ED Medications - No data to display  ED Course  I have reviewed the triage vital signs and the nursing notes.  Pertinent labs & imaging  results that were available during my care of the patient were reviewed by me and considered in my medical decision making (see chart for details).    MDM Rules/Calculators/A&P                          Patient presented initially as a code stroke.  Seen by neurology.  Head CT without acute findings.  Patient's NIH stroke scale is very low.  Recommend medicine admission with stroke work-up Final Clinical Impression(s) / ED Diagnoses Final diagnoses:  None    Rx / DC Orders ED Discharge Orders    None       Lacretia Leigh, MD 04/14/20 1315    Lacretia Leigh, MD 04/14/20 1336

## 2020-04-14 NOTE — Consult Note (Signed)
Neurology Consultation  Reason for Consult: Code stroke Referring Physician: Dr. Olen Pel  CC: Right-sided arm and leg decreased sensation  History is obtained from: EMS and patient  HPI: Jordan [name] is a 79 y.o. male with past medical history of diabetes, stroke, hypertension, hyperlipidemia, presence of permanent cardiac pacemaker.  Patient was brought in as a code stroke.  Apparently, patient was at the [facility] bathroom when he suddenly felt decreased sensation in his arm and leg.  Son came into the bathroom and his father told him to get assistance.  EMS arrived, and code stroke was called.  During exam at the bridge patient had slight drift of right arm and decreased sensation of right arm and leg.  CT head did not show any acute bleed or infarct.  Given NIH stroke scale of 2 tPA was not administered as he was too mild to treat.  LKW: 10:30 AM tpa given?: no, too mild to treat Premorbid modified Rankin scale (mRS): 0 NIH stroke score 2   Past Medical History:  Diagnosis Date  . Arthritis    R shoulder, bone spur  . Arthritis    "hands" (03/25/2016)  . Chronic lower back pain   . Coronary heart disease    Dr Pernell Dupre  . H/O cardiovascular stress test    perhaps last one was 2009  . Heart murmur    12/29/11 echo: mild MR, no AS, trivial TR  . History of gout   . Hyperlipidemia   . Hypertension    saw Dr. Linard Millers last earl;y- 2014, cardiac cath. last done ?2009, blocks seen didn't require any intervention at that point.  . Narcolepsy    [name] 04-11-97; Mean Latency 1.52min, SOREM 2  . OSA on CPAP    NPSG 12-13-98 AHI 22.7  . PONV (postoperative nausea and vomiting)   . Presence of permanent cardiac pacemaker   . Shortness of breath    with exertion   . Stroke ([place])   . Type II diabetes mellitus (HCC)     Family History  Problem Relation Age of Onset  . Sleep apnea Sister   . Colon cancer Sister    Social History:   reports that he quit smoking about 52 years ago. His  smoking use included cigarettes. He has a 15.00 pack-year smoking history. He has never used smokeless tobacco. He reports current alcohol use. He reports that he does not use drugs.  Medications No current facility-administered medications for this encounter.  Current Outpatient Medications:  .  amphetamine-dextroamphetamine (ADDERALL) 10 MG tablet, 1-3 tabs daily as needed, Disp: 90 tablet, Rfl: 0 .  atorvastatin (LIPITOR) 40 MG tablet, Take 20 mg by mouth daily before breakfast. , Disp: , Rfl:  .  clopidogrel (PLAVIX) 75 MG tablet, Take 1 tablet (75 mg total) by mouth daily., Disp: 30 tablet, Rfl: 1 .  glucosamine-chondroitin 500-400 MG tablet, Take 2 tablets by mouth daily., Disp: , Rfl:  .  lisinopril (ZESTRIL) 10 MG tablet, Take 10 mg by mouth daily., Disp: , Rfl:  .  metFORMIN (GLUCOPHAGE-XR) 500 MG 24 hr tablet, Take 2,000 mg by mouth daily with breakfast. , Disp: , Rfl:  .  Multiple Vitamin (MULTIVITAMIN WITH MINERALS) TABS tablet, Take 1 tablet by mouth daily., Disp: , Rfl:  .  NITROSTAT 0.4 MG SL tablet, PLACE 1 TABLET (0.4 MG TOTAL) UNDER THE TONGUE EVERY 5 (FIVE) MINUTES AS NEEDED FOR CHEST PAIN., Disp: 25 tablet, Rfl: 5 .  Omega 3 1200 MG  CAPS, Take 7,200 mg by mouth daily. , Disp: , Rfl:  .  ONE TOUCH ULTRA TEST test strip, Apply 1 strip topically as directed., Disp: , Rfl:   ROS:    General ROS: negative for - chills, fatigue, fever, night sweats, weight gain or weight loss Psychological ROS: negative for - behavioral disorder, hallucinations, memory difficulties, mood swings or suicidal ideation Ophthalmic ROS: negative for - blurry vision, double vision, eye pain or loss of vision ENT ROS: negative for - epistaxis, nasal discharge, oral lesions, sore throat, tinnitus or vertigo Allergy and Immunology ROS: negative for - hives or itchy/watery eyes Hematological and Lymphatic ROS: negative for - bleeding problems, bruising or swollen lymph nodes Endocrine ROS: negative for -  galactorrhea, hair pattern changes, polydipsia/polyuria or temperature intolerance Respiratory ROS: negative for - cough, hemoptysis, shortness of breath or wheezing Cardiovascular ROS: negative for - chest pain, dyspnea on exertion, edema or irregular heartbeat Gastrointestinal ROS: negative for - abdominal pain, diarrhea, hematemesis, nausea/vomiting or stool incontinence Genito-Urinary ROS: negative for - dysuria, hematuria, incontinence or urinary frequency/urgency Musculoskeletal ROS: negative for - joint swelling or muscular weakness Neurological ROS: as noted in HPI Dermatological ROS: negative for rash and skin lesion changes  Exam: Current vital signs: BP 138/89 (BP Location: Right Arm)   Pulse 65   Temp 98.5 F (36.9 C) (Oral)   Resp 17   Ht 5\' 8"  (1.727 m)   Wt 107.3 kg   SpO2 96%   BMI 35.97 kg/m  Vital signs in last 24 hours: Temp:  [98.5 F (36.9 C)] 98.5 F (36.9 C) (06/14 1303) Pulse Rate:  [65] 65 (06/14 1303) Resp:  [17] 17 (06/14 1303) BP: (138)/(89) 138/89 (06/14 1303) SpO2:  [96 %] 96 % (06/14 1303) Weight:  [107.3 kg] 107.3 kg (06/14 1302)   Constitutional: Appears well-developed and well-nourished.  Psych: Affect appropriate to situation Eyes: No scleral injection HENT: No OP obstrucion Head: Normocephalic.  Cardiovascular: Normal rate and regular rhythm.  Respiratory: Effort normal, non-labored breathing GI: Soft.  No distension. There is no tenderness.  Skin: WDI  Neuro: Mental Status: Patient is awake, alert, oriented to person, place, month, year, and situation. Speech-fluent, no signs of aphasia or dysarthria.  Able to follow all commands Cranial Nerves: II: Visual Fields are full.  III,IV, VI: EOMI without ptosis or diploplia. Pupils equal, round and reactive to light V: Facial sensation is symmetric to temperature VII: Facial movement is symmetric.  VIII: hearing is intact to voice X: Palat elevates symmetrically XI: Shoulder shrug is  symmetric. XII: tongue is midline without atrophy or fasciculations.  Motor: Tone is normal. Bulk is normal. 5/5 strength was present in all four extremities.  Drift-mild drift of right arm  Sensory: Decreased sensation on right arm and right leg Deep Tendon Reflexes: 2+ and symmetric in the biceps and patellae.  Plantars: Toes are downgoing bilaterally.  Cerebellar: FNF and HKS are intact bilaterally  Labs I have reviewed labs in epic and the results pertinent to this consultation are:   CBC    Component Value Date/Time   WBC 8.4 04/14/2020 1238   RBC 4.53 04/14/2020 1238   HGB 12.9 (L) 04/14/2020 1243   HCT 38.0 (L) 04/14/2020 1243   PLT 282 04/14/2020 1238   MCV 88.7 04/14/2020 1238   MCH 29.1 04/14/2020 1238   MCHC 32.8 04/14/2020 1238   RDW 13.6 04/14/2020 1238   LYMPHSABS 2.3 04/14/2020 1238   MONOABS 0.8 04/14/2020 1238   EOSABS 0.4  04/14/2020 1238   BASOSABS 0.1 04/14/2020 1238    CMP     Component Value Date/Time   NA 141 04/14/2020 1243   K 3.9 04/14/2020 1243   CL 107 04/14/2020 1243   CO2 21 (L) 02/18/2018 0711   GLUCOSE 181 (H) 04/14/2020 1243   BUN 11 04/14/2020 1243   CREATININE 0.90 04/14/2020 1243   CREATININE 1.16 03/18/2016 1011   CALCIUM 8.8 (L) 02/18/2018 0711   PROT 6.9 02/16/2018 1613   ALBUMIN 3.6 02/16/2018 1613   AST 36 02/16/2018 1613   ALT 28 02/16/2018 1613   ALKPHOS 64 02/16/2018 1613   BILITOT 0.6 02/16/2018 1613   GFRNONAA >60 02/18/2018 0711   GFRAA >60 02/18/2018 0711    Lipid Panel     Component Value Date/Time   CHOL 119 02/17/2018 0232   TRIG 108 02/17/2018 0232   HDL 36 (L) 02/17/2018 0232   CHOLHDL 3.3 02/17/2018 0232   VLDL 22 02/17/2018 0232   LDLCALC 61 02/17/2018 0232     Imaging I have reviewed the images obtained:  CT-scan of the brain-no evidence of acute intracranial abnormality.  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   Etta Quill PA-C [facility] [phone]  M-F  (9:00 am- 5:00 PM)  04/14/2020, 1:19 PM     Assessment:  This is an 79 year old male presented to the hospital with new onset of right arm and right leg decreased sensation.  Exam shows consistent decrease sensation on the right arm and leg.  Patient currently on Plavix.  Patient was too good to treat for TPA.  CT head showed old right lacunar but no infarct or hemorrhage.  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   You can look them up on [email or web address]  Password [email or web address]

## 2020-04-14 NOTE — Progress Notes (Signed)
Patient states he wears a CPAP at home with nasal pillows. 9.0 cm H20 for pressure.  Offered nasal mask. RT will follow up if patient is not transferred to a room.

## 2020-04-14 NOTE — ED Triage Notes (Addendum)
Pt was at [facility], had sudden onset of R arm weakness and numbness. Pt described sensation as thinking he was holding something with R hand, but when he looked he wasn't actually holding it. Hx of TIA, takes plavix.

## 2020-04-14 NOTE — Progress Notes (Signed)
VAST consulted to obtain 20g IV access in [facility] or higher for head and neck CT. Assessed pt's arm visually, while pt was attempting to eat. No appropriate vessels visualized in [place]. Pt requested VAST RN return after pt finishes dinner.   1930 VAST RN returned at 1900 to find patient's wife at bedside assisting pt with eating. Assessed pt's left arm utilizing [place] and attempted PIV placement x1 unsuccessfully. Second assess by VAST requested.

## 2020-04-15 ENCOUNTER — Observation Stay (HOSPITAL_COMMUNITY): Payer: Medicare HMO

## 2020-04-15 DIAGNOSIS — D62 Acute posthemorrhagic anemia: Secondary | ICD-10-CM | POA: Diagnosis not present

## 2020-04-15 DIAGNOSIS — I700 Atherosclerosis of aorta: Secondary | ICD-10-CM | POA: Diagnosis present

## 2020-04-15 DIAGNOSIS — I351 Nonrheumatic aortic (valve) insufficiency: Secondary | ICD-10-CM

## 2020-04-15 DIAGNOSIS — E1165 Type 2 diabetes mellitus with hyperglycemia: Secondary | ICD-10-CM | POA: Diagnosis present

## 2020-04-15 DIAGNOSIS — E1159 Type 2 diabetes mellitus with other circulatory complications: Secondary | ICD-10-CM | POA: Diagnosis not present

## 2020-04-15 DIAGNOSIS — I10 Essential (primary) hypertension: Secondary | ICD-10-CM | POA: Diagnosis not present

## 2020-04-15 DIAGNOSIS — Z87891 Personal history of nicotine dependence: Secondary | ICD-10-CM | POA: Diagnosis not present

## 2020-04-15 DIAGNOSIS — Z7982 Long term (current) use of aspirin: Secondary | ICD-10-CM | POA: Diagnosis not present

## 2020-04-15 DIAGNOSIS — I495 Sick sinus syndrome: Secondary | ICD-10-CM

## 2020-04-15 DIAGNOSIS — E7849 Other hyperlipidemia: Secondary | ICD-10-CM | POA: Diagnosis not present

## 2020-04-15 DIAGNOSIS — I639 Cerebral infarction, unspecified: Secondary | ICD-10-CM | POA: Diagnosis not present

## 2020-04-15 DIAGNOSIS — I272 Pulmonary hypertension, unspecified: Secondary | ICD-10-CM

## 2020-04-15 DIAGNOSIS — I5032 Chronic diastolic (congestive) heart failure: Secondary | ICD-10-CM | POA: Diagnosis present

## 2020-04-15 DIAGNOSIS — E1151 Type 2 diabetes mellitus with diabetic peripheral angiopathy without gangrene: Secondary | ICD-10-CM | POA: Diagnosis present

## 2020-04-15 DIAGNOSIS — G459 Transient cerebral ischemic attack, unspecified: Secondary | ICD-10-CM | POA: Diagnosis present

## 2020-04-15 DIAGNOSIS — Z9841 Cataract extraction status, right eye: Secondary | ICD-10-CM | POA: Diagnosis not present

## 2020-04-15 DIAGNOSIS — R29702 NIHSS score 2: Secondary | ICD-10-CM | POA: Diagnosis present

## 2020-04-15 DIAGNOSIS — Z800 Family history of malignant neoplasm of digestive organs: Secondary | ICD-10-CM | POA: Diagnosis not present

## 2020-04-15 DIAGNOSIS — G47419 Narcolepsy without cataplexy: Secondary | ICD-10-CM | POA: Diagnosis present

## 2020-04-15 DIAGNOSIS — N179 Acute kidney failure, unspecified: Secondary | ICD-10-CM | POA: Diagnosis not present

## 2020-04-15 DIAGNOSIS — E46 Unspecified protein-calorie malnutrition: Secondary | ICD-10-CM | POA: Diagnosis not present

## 2020-04-15 DIAGNOSIS — Z20822 Contact with and (suspected) exposure to covid-19: Secondary | ICD-10-CM | POA: Diagnosis present

## 2020-04-15 DIAGNOSIS — I2510 Atherosclerotic heart disease of native coronary artery without angina pectoris: Secondary | ICD-10-CM | POA: Diagnosis present

## 2020-04-15 DIAGNOSIS — I110 Hypertensive heart disease with heart failure: Secondary | ICD-10-CM | POA: Diagnosis present

## 2020-04-15 DIAGNOSIS — R7309 Other abnormal glucose: Secondary | ICD-10-CM | POA: Diagnosis not present

## 2020-04-15 DIAGNOSIS — E785 Hyperlipidemia, unspecified: Secondary | ICD-10-CM | POA: Diagnosis present

## 2020-04-15 DIAGNOSIS — L989 Disorder of the skin and subcutaneous tissue, unspecified: Secondary | ICD-10-CM | POA: Diagnosis not present

## 2020-04-15 DIAGNOSIS — R05 Cough: Secondary | ICD-10-CM | POA: Diagnosis not present

## 2020-04-15 DIAGNOSIS — Z9842 Cataract extraction status, left eye: Secondary | ICD-10-CM | POA: Diagnosis not present

## 2020-04-15 DIAGNOSIS — Z7984 Long term (current) use of oral hypoglycemic drugs: Secondary | ICD-10-CM | POA: Diagnosis not present

## 2020-04-15 DIAGNOSIS — Z7902 Long term (current) use of antithrombotics/antiplatelets: Secondary | ICD-10-CM | POA: Diagnosis not present

## 2020-04-15 DIAGNOSIS — E8809 Other disorders of plasma-protein metabolism, not elsewhere classified: Secondary | ICD-10-CM | POA: Diagnosis not present

## 2020-04-15 DIAGNOSIS — Z950 Presence of cardiac pacemaker: Secondary | ICD-10-CM | POA: Diagnosis not present

## 2020-04-15 DIAGNOSIS — I6381 Other cerebral infarction due to occlusion or stenosis of small artery: Secondary | ICD-10-CM | POA: Diagnosis present

## 2020-04-15 DIAGNOSIS — Z6835 Body mass index (BMI) 35.0-35.9, adult: Secondary | ICD-10-CM | POA: Diagnosis not present

## 2020-04-15 DIAGNOSIS — I69351 Hemiplegia and hemiparesis following cerebral infarction affecting right dominant side: Secondary | ICD-10-CM | POA: Diagnosis not present

## 2020-04-15 DIAGNOSIS — G4733 Obstructive sleep apnea (adult) (pediatric): Secondary | ICD-10-CM | POA: Diagnosis present

## 2020-04-15 LAB — ECHOCARDIOGRAM COMPLETE
Height: 68 in
Weight: 3777.8 oz

## 2020-04-15 LAB — LIPID PANEL
Cholesterol: 115 mg/dL (ref 0–200)
HDL: 32 mg/dL — ABNORMAL LOW (ref >40)
LDL Cholesterol: 57 mg/dL (ref 0–99)
Total CHOL/HDL Ratio: 3.6 RATIO
Triglycerides: 130 mg/dL (ref <150)
VLDL: 26 mg/dL (ref 0–40)

## 2020-04-15 LAB — GLUCOSE, CAPILLARY
Glucose-Capillary: 130 mg/dL — ABNORMAL HIGH (ref 70–99)
Glucose-Capillary: 149 mg/dL — ABNORMAL HIGH (ref 70–99)
Glucose-Capillary: 148 mg/dL — ABNORMAL HIGH (ref 70–99)
Glucose-Capillary: 177 mg/dL — ABNORMAL HIGH (ref 70–99)

## 2020-04-15 MED ORDER — ASPIRIN EC 81 MG PO TBEC
81.0000 mg | DELAYED_RELEASE_TABLET | Freq: Every day | ORAL | Status: DC
  Administered 2020-04-15 – 2020-04-18 (×4): 81 mg via ORAL
  Filled 2020-04-15 (×4): qty 1

## 2020-04-15 MED ORDER — INSULIN GLARGINE 100 UNIT/ML ~~LOC~~ SOLN
5.0000 [IU] | Freq: Two times a day (BID) | SUBCUTANEOUS | Status: DC
  Administered 2020-04-15 – 2020-04-18 (×6): 5 [IU] via SUBCUTANEOUS
  Filled 2020-04-15 (×7): qty 0.05

## 2020-04-15 NOTE — Evaluation (Addendum)
Occupational Therapy Evaluation Patient Details Name: Jordan [name] MRN: [id number] DOB: [birth year] Today's Date: 04/15/2020    History of Present Illness Jordan [name] is a 79 y.o. male with medical history significant of DM; CVA: OSA on CPAP; narcolepsy; pacemaker; HTN; HLD; and CAD presenting with R-sided numbness/tingling. CT negative, MRI pending.    Clinical Impression   PTA patient independent, driving. Admitted for above and limited by problem list below, including dominant R UE weakness, decreased coordination, and impaired sensation/proprioception, impaired balance, R inattention, impaired balance, impaired cognition, and decreased activity tolerance.  Patient oriented and follows simple commands with increased time, requires increased time to process and verbalize at times, decreased awareness of safety and requires cueing for problem solving.  Noted incontinent of bladder during session. Patient currently requires min-max assist for ADLs, min assist for transfers and limited in room mobility.  Pt reports spouse can provide supervision but not assist due to planned hip surgery soon, son can assist as needed. Patient will benefit from further OT services while admitted and after dc at CIR level to optimize independence and safety with ADLs, mobility and transfers.     Follow Up Recommendations  CIR    Equipment Recommendations  3 in 1 bedside commode;Other (comment) (RW)    Recommendations for Other Services Rehab consult     Precautions / Restrictions Precautions Precautions: Fall Precaution Comments: R sided inattention  Restrictions Weight Bearing Restrictions: No      Mobility Bed Mobility Overal bed mobility: Needs Assistance Bed Mobility: Supine to Sit     Supine to sit: Supervision Sit to supine: Supervision   General bed mobility comments: for safety and technique with HOB elevated  Transfers Overall transfer level: Needs assistance Equipment used:  None Transfers: Sit to/from Stand Sit to Stand: Min assist         General transfer comment: min assist to power up and steady from EOB    Balance Overall balance assessment: Needs assistance Sitting-balance support: Feet supported Sitting balance-Leahy Scale: Fair     Standing balance support: Single extremity supported;During functional activity;No upper extremity supported Standing balance-Leahy Scale: Poor Standing balance comment: relies on external support                           ADL either performed or assessed with clinical judgement   ADL Overall ADL's : Needs assistance/impaired     Grooming: Oral care;Minimal assistance;Standing Grooming Details (indicate cue type and reason): min assist to manage items at sink, at times impaired balance with R knee buckling and required min-mod assist for balance  Upper Body Bathing: Minimal assistance;Sitting   Lower Body Bathing: Sit to/from stand;Moderate assistance Lower Body Bathing Details (indicate cue type and reason): figure 4 technique for feet, poor balance dynamically, min assist sit to stand  Upper Body Dressing : Moderate assistance;Sitting Upper Body Dressing Details (indicate cue type and reason): to don new gown  Lower Body Dressing: Maximal assistance;Sit to/from stand Lower Body Dressing Details (indicate cue type and reason): figure 4 technique to adjust socks, relies on UE and external support in standing; min assist for sit to stand  Toilet Transfer: Minimal assistance;Ambulation Toilet Transfer Details (indicate cue type and reason): simulated in room   Toileting - Clothing Manipulation Details (indicate cue type and reason): incontinent of bladder at sink, reports normally wearing briefs at home; total assist to clean up     Functional mobility during ADLs: Minimal assistance;Cueing for  safety;Cueing for sequencing General ADL Comments: pt limited by R sided weakness and coordnation, impaired  balance, decreased activity tolerance     Vision Baseline Vision/History: Wears glasses Wears Glasses: At all times Patient Visual Report: No change from baseline Additional Comments: R inattention, further assessment required      Perception Perception Comments: R inattention noted   Praxis      Pertinent Vitals/Pain Pain Assessment: No/denies pain     Hand Dominance Right   Extremity/Trunk Assessment Upper Extremity Assessment Upper Extremity Assessment: RUE deficits/detail RUE Deficits / Details: grossly 3/5 MMT, decreased sensation, coordination and functionally dropping items at sink  RUE Sensation: decreased light touch;decreased proprioception RUE Coordination: decreased fine motor;decreased gross motor   Lower Extremity Assessment Lower Extremity Assessment: Defer to PT evaluation RLE Coordination: decreased gross motor   Cervical / Trunk Assessment Cervical / Trunk Assessment: Normal   Communication Communication Communication: Expressive difficulties (slow to repsond )   Cognition Arousal/Alertness: Awake/alert Behavior During Therapy: Flat affect Overall Cognitive Status: Impaired/Different from baseline Area of Impairment: Memory;Following commands;Safety/judgement;Awareness;Problem solving;Attention                   Current Attention Level: Sustained Memory: Decreased short-term memory;Decreased recall of precautions Following Commands: Follows one step commands consistently;Follows one step commands with increased time;Follows multi-step commands consistently Safety/Judgement: Decreased awareness of safety;Decreased awareness of deficits Awareness: Emergent Problem Solving: Slow processing;Decreased initiation;Difficulty sequencing;Requires verbal cues;Requires tactile cues General Comments: patient requires increased time to process and follow commands, decreased awareness to deficits but improving during session    General Comments        Exercises     Shoulder Instructions      Home Living Family/patient expects to be discharged to:: Private residence Living Arrangements: Spouse/significant other;Children Available Help at Discharge: Family;Available 24 hours/day Type of Home: Apartment Home Access: Elevator (to 10th floor)     Home Layout: One level     Bathroom Shower/Tub: Tub/shower unit;Walk-in shower   Bathroom Toilet: Standard     Home Equipment: Environmental consultant - 2 wheels;Cane - single point   Additional Comments: spouse has been taking care of her mother in [place] and has 2 scheduled surgeries in [place]      Prior Functioning/Environment Level of Independence: Independent        Comments: driving, retired; son has aspergers         OT Problem List: Decreased strength;Decreased activity tolerance;Impaired balance (sitting and/or standing);Impaired vision/perception;Decreased coordination;Decreased cognition;Decreased safety awareness;Decreased knowledge of use of DME or AE;Decreased knowledge of precautions;Impaired UE functional use;Impaired sensation;Obesity      OT Treatment/Interventions: Self-care/ADL training;DME and/or AE instruction;Cognitive remediation/compensation;Patient/family education;Balance training;Visual/perceptual remediation/compensation;Therapeutic activities;Neuromuscular education    OT Goals(Current goals can be found in the care plan section) Acute Rehab OT Goals Patient Stated Goal: to improve, go to rehab OT Goal Formulation: With patient Time For Goal Achievement: 04/29/20 Potential to Achieve Goals: Good  OT Frequency: Min 2X/week   Barriers to D/C:            Co-evaluation              AM-PAC OT "6 Clicks" Daily Activity     Outcome Measure Help from another person eating meals?: A Little Help from another person taking care of personal grooming?: A Little Help from another person toileting, which includes using toliet, bedpan, or urinal?: A Lot Help  from another person bathing (including washing, rinsing, drying)?: A Lot Help from another person to put on and taking  off regular upper body clothing?: A Lot Help from another person to put on and taking off regular lower body clothing?: A Lot 6 Click Score: 14   End of Session Equipment Utilized During Treatment: Gait belt Nurse Communication: Mobility status  Activity Tolerance: Patient tolerated treatment well Patient left: with call bell/phone within reach;Other (comment) (seated EOB with PT present )  OT Visit Diagnosis: Other abnormalities of gait and mobility (R26.89);Muscle weakness (generalized) (M62.81);Other symptoms and signs involving cognitive function;Other symptoms and signs involving the nervous system (R29.898)                Time: 4709-2957 OT Time Calculation (min): 31 min Charges:  OT General Charges $OT Visit: 1 Visit OT Evaluation $OT Eval Moderate Complexity: 1 Mod OT Treatments $Self Care/Home Management : 8-22 mins  Jolaine Artist, OT Acute Rehabilitation Services Pager [phone] Office [phone]   Delight Stare 04/15/2020, 12:54 PM

## 2020-04-15 NOTE — Progress Notes (Addendum)
STROKE TEAM PROGRESS NOTE   INTERVAL HISTORY No family is at the bedside.  Pt sitting in bed for lunch. He stated that his right sided numbness is better than yesterday. He has walked with PT OT and they recommend CIR. Pacemaker interrogation pending.   Vitals:   04/15/20 0400 04/15/20 0500 04/15/20 0621 04/15/20 0823  BP: (!) 193/108 (!) 204/116 (!) 177/97 112/63  Pulse: (!) 58 66 65 63  Resp:   18   Temp:   98.2 F (36.8 C) 98 F (36.7 C)  TempSrc:   Oral Oral  SpO2: 93% 95% 98% 98%  Weight:   107.1 kg   Height:   5\' 8"  (1.727 m)     CBC:  Recent Labs  Lab 04/14/20 1238 04/14/20 1243  WBC 8.4  --   NEUTROABS 4.7  --   HGB 13.2 12.9*  HCT 40.2 38.0*  MCV 88.7  --   PLT 282  --     Basic Metabolic Panel:  Recent Labs  Lab 04/14/20 1238 04/14/20 1243  NA 139 141  K 4.0 3.9  CL 108 107  CO2 21*  --   GLUCOSE 183* 181*  BUN 12 11  CREATININE 1.01 0.90  CALCIUM 9.4  --    Lipid Panel:     Component Value Date/Time   CHOL 115 04/15/2020 0724   TRIG 130 04/15/2020 0724   HDL 32 (L) 04/15/2020 0724   CHOLHDL 3.6 04/15/2020 0724   VLDL 26 04/15/2020 0724   LDLCALC 57 04/15/2020 0724   HgbA1c:  Lab Results  Component Value Date   HGBA1C 7.9 (H) 04/14/2020   Urine Drug Screen:     Component Value Date/Time   LABOPIA NONE DETECTED 04/14/2020 1700   COCAINSCRNUR NONE DETECTED 04/14/2020 1700   LABBENZ NONE DETECTED 04/14/2020 1700   AMPHETMU NONE DETECTED 04/14/2020 1700   THCU NONE DETECTED 04/14/2020 1700   LABBARB NONE DETECTED 04/14/2020 1700    Alcohol Level     Component Value Date/Time   ETH <10 04/14/2020 1238    IMAGING past 24 hours CT ANGIO HEAD W OR WO CONTRAST  Result Date: 04/14/2020 CLINICAL DATA:  Right-sided sensory deficit EXAM: CT ANGIOGRAPHY HEAD AND NECK TECHNIQUE: Multidetector CT imaging of the head and neck was performed using the standard protocol during bolus administration of intravenous contrast. Multiplanar CT image  reconstructions and MIPs were obtained to evaluate the vascular anatomy. Carotid stenosis measurements (when applicable) are obtained utilizing NASCET criteria, using the distal internal carotid diameter as the denominator. CONTRAST:  21mL OMNIPAQUE IOHEXOL 350 MG/ML SOLN COMPARISON:  Head CT 04/14/2020 FINDINGS: CTA NECK FINDINGS SKELETON: There is no bony spinal canal stenosis. No lytic or blastic lesion. OTHER NECK: Normal pharynx, larynx and major salivary glands. No cervical lymphadenopathy. Unremarkable thyroid gland. UPPER CHEST: No pneumothorax or pleural effusion. No nodules or masses. AORTIC ARCH: There is mild calcific atherosclerosis of the aortic arch. There is no aneurysm, dissection or hemodynamically significant stenosis of the visualized portion of the aorta. Conventional 3 vessel aortic branching pattern. The visualized proximal subclavian arteries are widely patent. RIGHT CAROTID SYSTEM: No dissection, occlusion or aneurysm. Mild atherosclerotic calcification at the carotid bifurcation without hemodynamically significant stenosis. LEFT CAROTID SYSTEM: No dissection, occlusion or aneurysm. Mild atherosclerotic calcification at the carotid bifurcation without hemodynamically significant stenosis. VERTEBRAL ARTERIES: Codominant configuration. Both origins are clearly patent. There is no dissection, occlusion or flow-limiting stenosis to the skull base (V1-V3 segments). CTA HEAD FINDINGS POSTERIOR CIRCULATION: --Vertebral  arteries: Calcific atherosclerosis. --Inferior cerebellar arteries: Normal. --Basilar artery: Normal. --Superior cerebellar arteries: Normal. --Posterior cerebral arteries (PCA): Normal. ANTERIOR CIRCULATION: --Intracranial internal carotid arteries: Normal. --Anterior cerebral arteries (ACA): Normal. Both A1 segments are present. Patent anterior communicating artery (a-comm). --Middle cerebral arteries (MCA): Normal. VENOUS SINUSES: As permitted by contrast timing, patent. ANATOMIC  VARIANTS: None Review of the MIP images confirms the above findings. IMPRESSION: 1. No emergent large vessel occlusion or high-grade stenosis of the intracranial or cervical arteries. 2. Aortic Atherosclerosis (ICD10-I70.0). Electronically Signed   By: Ulyses Jarred M.D.   On: 04/14/2020 21:54   CT ANGIO NECK W OR WO CONTRAST  Result Date: 04/14/2020 CLINICAL DATA:  Right-sided sensory deficit EXAM: CT ANGIOGRAPHY HEAD AND NECK TECHNIQUE: Multidetector CT imaging of the head and neck was performed using the standard protocol during bolus administration of intravenous contrast. Multiplanar CT image reconstructions and MIPs were obtained to evaluate the vascular anatomy. Carotid stenosis measurements (when applicable) are obtained utilizing NASCET criteria, using the distal internal carotid diameter as the denominator. CONTRAST:  78mL OMNIPAQUE IOHEXOL 350 MG/ML SOLN COMPARISON:  Head CT 04/14/2020 FINDINGS: CTA NECK FINDINGS SKELETON: There is no bony spinal canal stenosis. No lytic or blastic lesion. OTHER NECK: Normal pharynx, larynx and major salivary glands. No cervical lymphadenopathy. Unremarkable thyroid gland. UPPER CHEST: No pneumothorax or pleural effusion. No nodules or masses. AORTIC ARCH: There is mild calcific atherosclerosis of the aortic arch. There is no aneurysm, dissection or hemodynamically significant stenosis of the visualized portion of the aorta. Conventional 3 vessel aortic branching pattern. The visualized proximal subclavian arteries are widely patent. RIGHT CAROTID SYSTEM: No dissection, occlusion or aneurysm. Mild atherosclerotic calcification at the carotid bifurcation without hemodynamically significant stenosis. LEFT CAROTID SYSTEM: No dissection, occlusion or aneurysm. Mild atherosclerotic calcification at the carotid bifurcation without hemodynamically significant stenosis. VERTEBRAL ARTERIES: Codominant configuration. Both origins are clearly patent. There is no dissection,  occlusion or flow-limiting stenosis to the skull base (V1-V3 segments). CTA HEAD FINDINGS POSTERIOR CIRCULATION: --Vertebral arteries: Calcific atherosclerosis. --Inferior cerebellar arteries: Normal. --Basilar artery: Normal. --Superior cerebellar arteries: Normal. --Posterior cerebral arteries (PCA): Normal. ANTERIOR CIRCULATION: --Intracranial internal carotid arteries: Normal. --Anterior cerebral arteries (ACA): Normal. Both A1 segments are present. Patent anterior communicating artery (a-comm). --Middle cerebral arteries (MCA): Normal. VENOUS SINUSES: As permitted by contrast timing, patent. ANATOMIC VARIANTS: None Review of the MIP images confirms the above findings. IMPRESSION: 1. No emergent large vessel occlusion or high-grade stenosis of the intracranial or cervical arteries. 2. Aortic Atherosclerosis (ICD10-I70.0). Electronically Signed   By: Ulyses Jarred M.D.   On: 04/14/2020 21:54   ECHOCARDIOGRAM COMPLETE  Result Date: 04/15/2020    ECHOCARDIOGRAM REPORT   Patient Name:   Jordan [name] Date of Exam: 04/15/2020 Medical Rec #:  [id number]       Height:       68.0 in Accession #:    [id number]      Weight:       236.1 lb Date of Birth:  1941/03/22       BSA:          2.193 m Patient Age:    79 years        BP:           112/63 mmHg Patient Gender: M               HR:           55 bpm. Exam Location:  Inpatient Procedure: 2D Echo, Cardiac Doppler and  Color Doppler Indications:    TIA  History:        Patient has prior history of Echocardiogram examinations, most                 recent 02/17/2018. CAD, Pacemaker and Abnormal ECG, TIA and                 Stroke, Signs/Symptoms:Dyspnea; Risk Factors:Sleep Apnea,                 Hypertension, Dyslipidemia and Diabetes.  Sonographer:    Roseanna Rainbow RDCS Referring Phys: [id number] JENNIFER YATES  Sonographer Comments: Technically difficult study due to poor echo windows and patient is morbidly obese. Image acquisition challenging due to patient body habitus.  IMPRESSIONS  1. Left ventricular ejection fraction, by estimation, is 65 to 70%. The left ventricle has normal function. The left ventricle has no regional wall motion abnormalities. There is mild concentric left ventricular hypertrophy. Left ventricular diastolic parameters are consistent with Grade II diastolic dysfunction (pseudonormalization). Elevated left atrial pressure.  2. Right ventricular systolic function is normal. The right ventricular size is normal. There is mildly elevated pulmonary artery systolic pressure. The estimated right ventricular systolic pressure is 48.5 mmHg.  3. Left atrial size was mildly dilated.  4. The mitral valve is normal in structure. No evidence of mitral valve regurgitation. No evidence of mitral stenosis.  5. The aortic valve is normal in structure. Aortic valve regurgitation is mild. No aortic stenosis is present.  6. The inferior vena cava is dilated in size with <50% respiratory variability, suggesting right atrial pressure of 15 mmHg. FINDINGS  Left Ventricle: Left ventricular ejection fraction, by estimation, is 65 to 70%. The left ventricle has normal function. The left ventricle has no regional wall motion abnormalities. The left ventricular internal cavity size was normal in size. There is  mild concentric left ventricular hypertrophy. Left ventricular diastolic parameters are consistent with Grade II diastolic dysfunction (pseudonormalization). Elevated left atrial pressure. Right Ventricle: The right ventricular size is normal. No increase in right ventricular wall thickness. Right ventricular systolic function is normal. There is mildly elevated pulmonary artery systolic pressure. The tricuspid regurgitant velocity is 1.98  m/s, and with an assumed right atrial pressure of 15 mmHg, the estimated right ventricular systolic pressure is 46.2 mmHg. Left Atrium: Left atrial size was mildly dilated. Right Atrium: Right atrial size was normal in size. Pericardium: There  is no evidence of pericardial effusion. Mitral Valve: The mitral valve is normal in structure. Normal mobility of the mitral valve leaflets. Mild mitral annular calcification. No evidence of mitral valve regurgitation. No evidence of mitral valve stenosis. Tricuspid Valve: The tricuspid valve is normal in structure. Tricuspid valve regurgitation is not demonstrated. No evidence of tricuspid stenosis. Aortic Valve: The aortic valve is normal in structure. Aortic valve regurgitation is mild. No aortic stenosis is present. Pulmonic Valve: The pulmonic valve was normal in structure. Pulmonic valve regurgitation is not visualized. No evidence of pulmonic stenosis. Aorta: The aortic root is normal in size and structure. Venous: The inferior vena cava is dilated in size with less than 50% respiratory variability, suggesting right atrial pressure of 15 mmHg. IAS/Shunts: No atrial level shunt detected by color flow Doppler. Additional Comments: A pacer wire is visualized.  LEFT VENTRICLE PLAX 2D LVIDd:         4.80 cm     Diastology LVIDs:         3.30 cm     LV  e' lateral:   7.40 cm/s LV PW:         1.20 cm     LV E/e' lateral: 17.5 LV IVS:        1.40 cm     LV e' medial:    5.00 cm/s LVOT diam:     2.00 cm     LV E/e' medial:  25.9 LV SV:         67 LV SV Index:   31 LVOT Area:     3.14 cm  LV Volumes (MOD) LV vol d, MOD A2C: 75.7 ml LV vol d, MOD A4C: 96.6 ml LV vol s, MOD A2C: 20.7 ml LV vol s, MOD A4C: 25.2 ml LV SV MOD A2C:     55.0 ml LV SV MOD A4C:     96.6 ml LV SV MOD BP:      62.2 ml RIGHT VENTRICLE            IVC RV S prime:     6.74 cm/s  IVC diam: 2.60 cm TAPSE (M-mode): 1.4 cm LEFT ATRIUM             Index       RIGHT ATRIUM           Index LA diam:        3.90 cm 1.78 cm/m  RA Area:     12.10 cm LA Vol (A2C):   24.8 ml 11.31 ml/m RA Volume:   21.20 ml  9.67 ml/m LA Vol (A4C):   39.8 ml 18.15 ml/m LA Biplane Vol: 32.0 ml 14.59 ml/m  AORTIC VALVE LVOT Vmax:   131.00 cm/s LVOT Vmean:  98.300 cm/s LVOT  VTI:    0.213 m  AORTA Ao Root diam: 3.90 cm Ao Asc diam:  3.30 cm MITRAL VALVE                TRICUSPID VALVE MV Area (PHT): 3.72 cm     TR Peak grad:   15.7 mmHg MV Decel Time: 204 msec     TR Vmax:        198.00 cm/s MV E velocity: 129.33 cm/s MV A velocity: 106.00 cm/s  SHUNTS MV E/A ratio:  1.22         Systemic VTI:  0.21 m                             Systemic Diam: 2.00 cm Dani Gobble Croitoru MD Electronically signed by Sanda Klein MD Signature Date/Time: 04/15/2020/11:06:17 AM    Final    CT HEAD CODE STROKE WO CONTRAST  Result Date: 04/14/2020 CLINICAL DATA:  Code stroke.  Right arm weakness. EXAM: CT HEAD WITHOUT CONTRAST TECHNIQUE: Contiguous axial images were obtained from the base of the skull through the vertex without intravenous contrast. COMPARISON:  Head CT 02/16/2018 and MRI 02/17/2018 FINDINGS: Brain: There is no evidence of acute infarct, intracranial hemorrhage, mass, midline shift, or extra-axial fluid collection. Small chronic infarcts in the anterior left frontal lobe, medial right occipital lobe, and cerebellum are unchanged. Hypodensities in the cerebral white matter bilaterally are unchanged and nonspecific but compatible with mild chronic small vessel ischemic disease. There is mild cerebral atrophy. Vascular: Calcified atherosclerosis at the skull base. No hyperdense vessel. Skull: No fracture or suspicious osseous lesion. Sinuses/Orbits: New complete opacification of the left frontal sinus and left anterior ethmoid air cells. Near complete opacification of the included portion of the left  maxillary sinus with osseous sinus wall thickening. Clear mastoid air cells. Bilateral cataract extraction. Other: None. ASPECTS [facility] Stroke Program Early CT Score) - Ganglionic level infarction (caudate, lentiform nuclei, internal capsule, insula, M1-M3 cortex): 7 - Supraganglionic infarction (M4-M6 cortex): 3 Total score (0-10 with 10 being normal): 10 IMPRESSION: 1. No evidence of acute  intracranial abnormality. 2. ASPECTS is 10. 3. 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 4. Chronic appearing left-sided sinusitis in an ostiomeatal unit obstruction pattern. These results were called by telephone at the time of interpretation on 04/14/2020 at 12:55 pm to Dr. Orlena Sheldon, who verbally acknowledged these results. Electronically Signed   By: Logan Bores M.D.   On: 04/14/2020 12:56    PHYSICAL EXAM  Temp:  [98 F (36.7 C)-98.2 F (36.8 C)] 98.1 F (36.7 C) (06/15 1303) Pulse Rate:  [56-71] 61 (06/15 1303) Resp:  [17-20] 18 (06/15 1303) BP: (112-204)/(63-133) 176/90 (06/15 1303) SpO2:  [91 %-98 %] 98 % (06/15 1303) Weight:  [107.1 kg] 107.1 kg (06/15 0621)  General - Well nourished, well developed, in no apparent distress.  Ophthalmologic - fundi not visualized due to noncooperation.  Cardiovascular - Regular rhythm and rate.  Mental Status -  Level of arousal and orientation to time, place, and person were intact. Language including expression, naming, repetition, comprehension was assessed and found intact. Fund of Knowledge was assessed and was intact.  Cranial Nerves II - XII - II - Visual field intact OU. III, IV, VI - Extraocular movements intact. V - Facial sensation intact bilaterally. VII - Facial movement intact bilaterally. VIII - Hearing & vestibular intact bilaterally. X - Palate elevates symmetrically. XI - Chin turning & shoulder shrug intact bilaterally. XII - Tongue protrusion intact.  Motor Strength - The patient's strength was normal in all extremities and pronator drift was absent.  Bulk was normal and fasciculations were absent.   Motor Tone - Muscle tone was assessed at the neck and appendages and was normal.  Reflexes - The patient's reflexes were symmetrical in all extremities and he had no pathological reflexes.  Sensory - Light touch, temperature/pinprick were assessed and were symmetrical.    Coordination  - The patient had normal movements in the hands with no ataxia or dysmetria.  Tremor was absent.  Gait and Station - deferred.   ASSESSMENT/PLAN [name] is a 79 y.o. male with history of  diabetes, stroke, hypertension, hyperlipidemia, permanent cardiac pacemaker presenting with R sided decreased sensation.   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 Small vessel disease. Sinus dz. ASPECTS 10.     CTA head & neck no LVO. Aortic atherosclerosis.   MRI  L thalamic infarct. Small vessel disease. Atrophy.   2D Echo EF 65-70%. No source of embolus. LA mildly dilated.   Pacemaker interrogation no afib  LDL 57  HgbA1c 7.9  Lovenox 40 mg sq daily for VTE prophylaxis  clopidogrel 75 mg daily prior to admission, now on aspirin 81 mg daily and clopidogrel 75 mg daily. Continue DAPT x 3 weeks then plavix alone    Therapy recommendations:  CIR  Disposition:  pending   Hx stroke/TIA  01/2018 - Multifocal punctate infarcts  on MRI, concerning for embolic pattern.  MRA negative.  Carotid Doppler unremarkable.  LDL 61, A1c 8.6.  Discharged with DAPT and Lipitor 20. pacemaker interrogation not able to perform during that admission.   Hypertensive  Urgency  BP as high as 204/116 on arrival  BP normalizing, within parameters . Permissive hypertension (OK if < 180/105) but gradually normalize in 2-3 days . Long-term BP goal normotensive  Hyperlipidemia  Home meds:  lipitor 40, resumed in hospital  LDL 57, goal < 70  Continue statin at discharge  Diabetes type II Uncontrolled  HgbA1c 7.9, goal < 7.0  SSI  CBG monitoring  Close PCP follow-up for better DM control  Other Stroke Risk Factors  Advanced age  Former Cigarette smoker, quit 52 yrs ago  ETOH use, alcohol level <10, advised to drink no more than 2 drink(s) a day  Obesity, Body mass index is 35.9 kg/m., recommend weight loss, diet  and exercise as appropriate     Coronary artery disease  Obstructive sleep apnea, on CPAP at home  Has pacer Lovena Le) - interrogation no afib  Other Active Problems  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 Please call with questions. Pt will follow up with stroke clinic NP at [facility] in about 4 weeks. Thanks for the consult.  Rosalin Hawking, MD PhD Stroke Neurology 04/15/2020 5:06 PM   To contact Stroke Continuity provider, please refer to [email or web address] After hours, contact General Neurology

## 2020-04-15 NOTE — Progress Notes (Signed)
Received pt from the ED, alert and oriented X4, implemented MD orders, BP elevated but permissive BP at the moment as per MD order, call light in reach, oriented to the room.

## 2020-04-15 NOTE — Evaluation (Signed)
Physical Therapy Evaluation Patient Details Name: Jordan [name] MRN: [id number] DOB: 1941-08-15 Today's Date: 04/15/2020   History of Present Illness  ALEKSEI GOODLIN is a 79 y.o. male with medical history significant of DM; CVA: OSA on CPAP; narcolepsy; pacemaker; HTN; HLD; and CAD presenting with R-sided numbness/tingling.  Clinical Impression  Patient received sitting edge of bed with OT present. Patient agreeable to PT assessment. He requires min guard for sit to stand. Ambulation with RW 75 feet with mod assist due to decreased strength on right, difficulty with obstacle avoidance on right, difficulty staying inside and close to walker. Decreased safety awareness. He will continue to benefit from skilled PT while here to improve strength, safety and functional independence.    Follow Up Recommendations CIR    Equipment Recommendations  Other (comment) (TBD)    Recommendations for Other Services Rehab consult     Precautions / Restrictions Precautions Precautions: Fall Restrictions Weight Bearing Restrictions: No      Mobility  Bed Mobility Overal bed mobility: Modified Independent Bed Mobility: Sit to Supine       Sit to supine: Supervision   General bed mobility comments: cues needed for positioning. Supervision for safety  Transfers Overall transfer level: Needs assistance Equipment used: Rolling walker (2 wheeled) Transfers: Sit to/from Stand Sit to Stand: Min guard            Ambulation/Gait Ambulation/Gait assistance: Mod assist Gait Distance (Feet): 75 Feet Assistive device: Rolling walker (2 wheeled) Gait Pattern/deviations: Step-to pattern;Decreased step length - right;Trunk flexed;Narrow base of support;Decreased stride length Gait velocity: decr   General Gait Details: decreased step on right, running into things on his right. Gets outside of RW, difficulty keeping Right hand on walker. Decreased safety awareness. R LE buckling occasioanlly with  ambulation.  Stairs            Wheelchair Mobility    Modified Rankin (Stroke Patients Only) Modified Rankin (Stroke Patients Only) Pre-Morbid Rankin Score: No symptoms Modified Rankin: Moderately severe disability     Balance Overall balance assessment: Needs assistance Sitting-balance support: Feet supported Sitting balance-Leahy Scale: Fair     Standing balance support: Bilateral upper extremity supported;During functional activity Standing balance-Leahy Scale: Poor Standing balance comment: reliant on RW and mod external assist for safety                             Pertinent Vitals/Pain Pain Assessment: No/denies pain    Home Living Family/patient expects to be discharged to:: Inpatient rehab Living Arrangements: Spouse/significant other;Children Available Help at Discharge: Family;Available 24 hours/day Type of Home: Apartment Home Access: Elevator     Home Layout: One level Home Equipment: Walker - 2 wheels;Cane - single point Additional Comments: spouse has been taking care of her mother in [place] and has 2 scheduled surgeries in [place]    Prior Function Level of Independence: Independent         Comments: driving, retired; 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 Dominance   Dominant Hand: Right    Extremity/Trunk Assessment   Upper Extremity Assessment Upper Extremity Assessment: Defer to OT evaluation    Lower Extremity Assessment Lower Extremity Assessment: RLE deficits/detail RLE Coordination: decreased gross motor    Cervical / Trunk Assessment Cervical / Trunk Assessment: Normal  Communication   Communication: No difficulties  Cognition Arousal/Alertness: Awake/alert Behavior During Therapy: WFL for tasks assessed/performed Overall Cognitive Status: Impaired/Different from baseline Area of Impairment:  Memory;Safety/judgement;Awareness;Problem solving                     Memory: Decreased short-term memory    Safety/Judgement: Decreased awareness of safety;Decreased awareness of deficits Awareness: Emergent Problem Solving: Slow processing;Difficulty sequencing;Requires verbal cues;Requires tactile cues        General Comments      Exercises     Assessment/Plan    PT Assessment Patient needs continued PT services  PT Problem List Decreased strength;Decreased mobility;Decreased safety awareness;Decreased activity tolerance;Decreased balance;Decreased cognition;Decreased knowledge of use of DME;Decreased knowledge of precautions;Impaired sensation       PT Treatment Interventions DME instruction;Therapeutic activities;Gait training;Therapeutic exercise;Stair training;Balance training;Functional mobility training;Neuromuscular re-education;Patient/family education;Cognitive remediation    PT Goals (Current goals can be found in the Care Plan section)  Acute Rehab PT Goals Patient Stated Goal: to improve, go to rehab PT Goal Formulation: With patient Time For Goal Achievement: 04/29/20 Potential to Achieve Goals: Good    Frequency Min 4X/week   Barriers to discharge Decreased caregiver support      Co-evaluation               AM-PAC PT "6 Clicks" Mobility  Outcome Measure Help needed turning from your back to your side while in a flat bed without using bedrails?: A Little Help needed moving from lying on your back to sitting on the side of a flat bed without using bedrails?: A Little Help needed moving to and from a bed to a chair (including a wheelchair)?: A Lot Help needed standing up from a chair using your arms (e.g., wheelchair or bedside chair)?: A Little Help needed to walk in hospital room?: A Lot Help needed climbing 3-5 steps with a railing? : Total 6 Click Score: 14    End of Session Equipment Utilized During Treatment: Gait belt Activity Tolerance: Patient limited by fatigue Patient left: in bed;with bed alarm set;with call bell/phone within reach Nurse  Communication: Mobility status PT Visit Diagnosis: Unsteadiness on feet (R26.81);Muscle weakness (generalized) (M62.81);Other abnormalities of gait and mobility (R26.89);Difficulty in walking, not elsewhere classified (R26.2);Hemiplegia and hemiparesis Hemiplegia - Right/Left: Right Hemiplegia - dominant/non-dominant: Dominant Hemiplegia - caused by: Unspecified;Cerebral infarction    Time: 0920-0943 PT Time Calculation (min) (ACUTE ONLY): 23 min   Charges:   PT Evaluation $PT Eval Moderate Complexity: 1 Mod PT Treatments $Gait Training: 8-22 mins        Shaqueta Casady, PT, GCS 04/15/20,9:55 AM

## 2020-04-15 NOTE — Progress Notes (Signed)
PROGRESS NOTE  Jordan [name] OIN:[id number] DOB: 1941/01/31   PCP: [name], MD  Patient is from: Home.  DOA: 04/14/2020 LOS: 0  Brief Narrative / Interim history: 79 year old male with history of DM-2, CVA, OSA on CPAP, narcolepsy, SSS/PPM and morbid obesity presenting with right-sided numbness and tingling more in the arm than legs and admitted for CVA work-up.  On arrival to ED, hemodynamically stable although his blood pressure was elevated later on.  Labs including CMP and CBC with differential without significant finding other than elevated glucose.  UA, COVID-19 PCR, UDS, CT head without contrast and CTA head and neck without acute finding.  Neurology consulted and admitted for CVA work-up.   The next day, MRI brain revealed acute left thalamic stroke.  Echo with EF of 65 to 70%, G2 DD and RVSP of 31 mmHg but no PFO or thrombus.  A1c 7.9%.  LDL 57.  TSH within normal.  Evaluated by therapy who recommended CIR.  Subjective: Seen and examined later this afternoon.  Reports improvement on the right-sided numbness and tingling.  He denies headache, vision change, palpitation, dizziness, chest pain, dyspnea, GI or UTI symptoms.  Objective: Vitals:   04/15/20 0621 04/15/20 0823 04/15/20 1303 04/15/20 1604  BP: (!) 177/97 112/63 (!) 176/90 (!) 174/91  Pulse: 65 63 61 61  Resp: 18  18 18   Temp: 98.2 F (36.8 C) 98 F (36.7 C) 98.1 F (36.7 C) 98.2 F (36.8 C)  TempSrc: Oral Oral Oral Oral  SpO2: 98% 98% 98% 95%  Weight: 107.1 kg     Height: 5\' 8"  (1.727 m)       Intake/Output Summary (Last 24 hours) at 04/15/2020 1802 Last data filed at 04/15/2020 1519 Gross per 24 hour  Intake 1000 ml  Output 925 ml  Net 75 ml   Filed Weights   04/14/20 1200 04/14/20 1302 04/15/20 0621  Weight: 107.3 kg 107.3 kg 107.1 kg    Examination:  GENERAL: No apparent distress.  Nontoxic. HEENT: MMM.  Vision and hearing grossly intact.  NECK: Supple.  No apparent JVD.  RESP: On room  air.  No IWOB.  Fair aeration bilaterally. CVS:  RRR. Heart sounds normal.  ABD/GI/GU: BS+. Abd soft, NTND.  MSK/EXT:  Moves extremities. No apparent deformity. No edema.  SKIN: no apparent skin lesion or wound NEURO: Awake, alert and oriented appropriately.  CN II-XII intact.  Smiles right arm paresthesia.  Motor 5/5 in all extremities.  No pronator drift.  Finger-to-nose intact.  Patellar reflex symmetric. PSYCH: Calm. Normal affect.   Procedures:  None  Microbiology summarized: COVID-19 PCR negative.  Assessment & Plan: Right-sided paresthesia/acute left thalamic stroke-noted on MRI brain.  CTA head and neck without acute finding.  Echo with EF of 65 to 70%, G2 DD and RVSP of 31 mmHg but no PFO or thrombus.  A1c 7.9%.  LDL 57.  TSH within normal.  UDS negative.  No arrhythmia on PPM interrogation. -Neurology following and recommended DAPT Plavix and low aspirin for 3 weeks followed by Plavix alone. -Optimize risk factors -Permissive hypertension up to 180/105 for 2 to 3 days. -Therapy recommended CIR. -Continue statin.  Uncontrolled DM-2 with hyperglycemia: A1c 7.9%. 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  Appears euvolemic except for trace edema. -Monitor fluid status. -May benefit from as  needed diuretics down the road.. -Optimize BP control.  Essential hypertension: BP slightly elevated. -Permissive hypertension as above -Continue IV fluid  OSA on CPAP-compliant. -Continue CPAP  Narcolepsy -Continue home medications  SSS s/p PPM   Morbid obesity: Body mass index is 35.9 kg/m.  With diabetes. -Encourage lifestyle change to lose weight.          DVT prophylaxis:  enoxaparin (LOVENOX) injection 40 mg Start: 04/14/20 1800  Code Status: Full code Family Communication:  Updated patient's wife over the phone from patient's phone. Status is: Inpatient  Remains inpatient appropriate because:Unsafe d/c plan and Inpatient level of care appropriate due to severity of illness   Dispo: The patient is from: Home              Anticipated d/c is to: CIR              Anticipated d/c date is: 1 day              Patient currently is not medically stable to d/c.       Consultants:  Neurology   Sch Meds:  Scheduled Meds: . aspirin EC  81 mg Oral Daily  . atorvastatin  40 mg Oral QAC breakfast  . clopidogrel  75 mg Oral Daily  . enoxaparin (LOVENOX) injection  40 mg Subcutaneous Q24H  . insulin aspart  0-15 Units Subcutaneous TID WC  . insulin aspart  0-5 Units Subcutaneous QHS   Continuous Infusions: . sodium chloride 50 mL/hr at 04/14/20 1601   PRN Meds:.acetaminophen **OR** acetaminophen (TYLENOL) oral liquid 160 mg/5 mL **OR** acetaminophen, senna-docusate  Antimicrobials: Anti-infectives (From admission, onward)   None       I have personally reviewed the following labs and images: CBC: Recent Labs  Lab 04/14/20 1238 04/14/20 1243  WBC 8.4  --   NEUTROABS 4.7  --   HGB 13.2 12.9*  HCT 40.2 38.0*  MCV 88.7  --   PLT 282  --    BMP &GFR Recent Labs  Lab 04/14/20 1238 04/14/20 1243  NA 139 141  K 4.0 3.9  CL 108 107  CO2 21*  --   GLUCOSE 183* 181*  BUN 12 11  CREATININE 1.01 0.90  CALCIUM 9.4  --    Estimated Creatinine Clearance: 77.7 mL/min (by C-G formula based on SCr of 0.9 mg/dL). Liver & Pancreas: Recent Labs  Lab 04/14/20 1238  AST 33  ALT 23  ALKPHOS 59  BILITOT 0.8  PROT 6.6  ALBUMIN 3.5   No results for input(s): LIPASE, AMYLASE in the last 168 hours. No results for input(s): AMMONIA in the last 168 hours. Diabetic: Recent Labs    04/14/20 1238  HGBA1C 7.9*   Recent Labs  Lab 04/14/20 1754 04/14/20 2148 04/15/20 0618 04/15/20 1301 04/15/20 1739  GLUCAP 140* 132* 130* 149* 148*   Cardiac  Enzymes: No results for input(s): CKTOTAL, CKMB, CKMBINDEX, TROPONINI in the last 168 hours. No results for input(s): PROBNP in the last 8760 hours. Coagulation Profile: Recent Labs  Lab 04/14/20 1238  INR 1.1   Thyroid Function Tests: Recent Labs    04/14/20 1700  TSH 0.976   Lipid Profile: Recent Labs    04/15/20 0724  CHOL 115  HDL 32*  LDLCALC 57  TRIG 130  CHOLHDL 3.6   Anemia Panel: No results for input(s): VITAMINB12, FOLATE, FERRITIN, TIBC, IRON, RETICCTPCT in the last 72 hours. Urine analysis:    Component Value Date/Time   COLORURINE YELLOW  04/14/2020 1700   APPEARANCEUR CLEAR 04/14/2020 1700   LABSPEC 1.014 04/14/2020 1700   PHURINE 5.0 04/14/2020 1700   GLUCOSEU NEGATIVE 04/14/2020 1700   HGBUR NEGATIVE 04/14/2020 1700   BILIRUBINUR NEGATIVE 04/14/2020 1700   KETONESUR NEGATIVE 04/14/2020 1700   PROTEINUR NEGATIVE 04/14/2020 1700   NITRITE NEGATIVE 04/14/2020 1700   LEUKOCYTESUR NEGATIVE 04/14/2020 1700   Sepsis Labs: Invalid input(s): PROCALCITONIN, [place]  Microbiology: Recent Results (from the past 240 hour(s))  SARS Coronavirus 2 by RT PCR (hospital order, performed in [facility] hospital lab) Nasopharyngeal Nasopharyngeal Swab     Status: None   Collection Time: 04/14/20  3:33 PM   Specimen: Nasopharyngeal Swab  Result Value Ref Range Status   SARS Coronavirus 2 NEGATIVE NEGATIVE Final    Comment: (NOTE) SARS-CoV-2 target nucleic acids are NOT DETECTED.  The SARS-CoV-2 RNA is generally detectable in upper and lower respiratory specimens during the acute phase of infection. The lowest concentration of SARS-CoV-2 viral copies this assay can detect is 250 copies / mL. A negative result does not preclude SARS-CoV-2 infection and should not be used as the sole basis for treatment or other patient management decisions.  A negative result may occur with improper specimen collection / handling, submission of specimen other than  nasopharyngeal swab, presence of viral mutation(s) within the areas targeted by this assay, and inadequate number of viral copies (<250 copies / mL). A negative result must be combined with clinical observations, patient history, and epidemiological information.  Fact Sheet for Patients:   StrictlyIdeas.no  Fact Sheet for Healthcare Providers: BankingDealers.co.za  This test is not yet approved or  cleared by the [place] FDA and has been authorized for detection and/or diagnosis of SARS-CoV-2 by FDA under an Emergency Use Authorization (EUA).  This EUA will remain in effect (meaning this test can be used) for the duration of the COVID-19 declaration under Section 564(b)(1) of the Act, 21 U.S.C. section 360bbb-3(b)(1), unless the authorization is terminated or revoked sooner.  Performed at [facility], [place] [address], [place], [place] [address]     Radiology Studies: CT ANGIO HEAD W OR WO CONTRAST  Result Date: 04/14/2020 CLINICAL DATA:  Right-sided sensory deficit EXAM: CT ANGIOGRAPHY HEAD AND NECK TECHNIQUE: Multidetector CT imaging of the head and neck was performed using the standard protocol during bolus administration of intravenous contrast. Multiplanar CT image reconstructions and MIPs were obtained to evaluate the vascular anatomy. Carotid stenosis measurements (when applicable) are obtained utilizing NASCET criteria, using the distal internal carotid diameter as the denominator. CONTRAST:  3mL OMNIPAQUE IOHEXOL 350 MG/ML SOLN COMPARISON:  Head CT 04/14/2020 FINDINGS: CTA NECK FINDINGS SKELETON: There is no bony spinal canal stenosis. No lytic or blastic lesion. OTHER NECK: Normal pharynx, larynx and major salivary glands. No cervical lymphadenopathy. Unremarkable thyroid gland. UPPER CHEST: No pneumothorax or pleural effusion. No nodules or masses. AORTIC ARCH: There is mild calcific atherosclerosis of the aortic arch.  There is no aneurysm, dissection or hemodynamically significant stenosis of the visualized portion of the aorta. Conventional 3 vessel aortic branching pattern. The visualized proximal subclavian arteries are widely patent. RIGHT CAROTID SYSTEM: No dissection, occlusion or aneurysm. Mild atherosclerotic calcification at the carotid bifurcation without hemodynamically significant stenosis. LEFT CAROTID SYSTEM: No dissection, occlusion or aneurysm. Mild atherosclerotic calcification at the carotid bifurcation without hemodynamically significant stenosis. VERTEBRAL ARTERIES: Codominant configuration. Both origins are clearly patent. There is no dissection, occlusion or flow-limiting stenosis to the skull base (V1-V3 segments). CTA HEAD FINDINGS  POSTERIOR CIRCULATION: --Vertebral arteries: Calcific atherosclerosis. --Inferior cerebellar arteries: Normal. --Basilar artery: Normal. --Superior cerebellar arteries: Normal. --Posterior cerebral arteries (PCA): Normal. ANTERIOR CIRCULATION: --Intracranial internal carotid arteries: Normal. --Anterior cerebral arteries (ACA): Normal. Both A1 segments are present. Patent anterior communicating artery (a-comm). --Middle cerebral arteries (MCA): Normal. VENOUS SINUSES: As permitted by contrast timing, patent. ANATOMIC VARIANTS: None Review of the MIP images confirms the above findings. IMPRESSION: 1. No emergent large vessel occlusion or high-grade stenosis of the intracranial or cervical arteries. 2. Aortic Atherosclerosis (ICD10-I70.0). Electronically Signed   By: Ulyses Jarred M.D.   On: 04/14/2020 21:54   CT ANGIO NECK W OR WO CONTRAST  Result Date: 04/14/2020 CLINICAL DATA:  Right-sided sensory deficit EXAM: CT ANGIOGRAPHY HEAD AND NECK TECHNIQUE: Multidetector CT imaging of the head and neck was performed using the standard protocol during bolus administration of intravenous contrast. Multiplanar CT image reconstructions and MIPs were obtained to evaluate the vascular  anatomy. Carotid stenosis measurements (when applicable) are obtained utilizing NASCET criteria, using the distal internal carotid diameter as the denominator. CONTRAST:  72mL OMNIPAQUE IOHEXOL 350 MG/ML SOLN COMPARISON:  Head CT 04/14/2020 FINDINGS: CTA NECK FINDINGS SKELETON: There is no bony spinal canal stenosis. No lytic or blastic lesion. OTHER NECK: Normal pharynx, larynx and major salivary glands. No cervical lymphadenopathy. Unremarkable thyroid gland. UPPER CHEST: No pneumothorax or pleural effusion. No nodules or masses. AORTIC ARCH: There is mild calcific atherosclerosis of the aortic arch. There is no aneurysm, dissection or hemodynamically significant stenosis of the visualized portion of the aorta. Conventional 3 vessel aortic branching pattern. The visualized proximal subclavian arteries are widely patent. RIGHT CAROTID SYSTEM: No dissection, occlusion or aneurysm. Mild atherosclerotic calcification at the carotid bifurcation without hemodynamically significant stenosis. LEFT CAROTID SYSTEM: No dissection, occlusion or aneurysm. Mild atherosclerotic calcification at the carotid bifurcation without hemodynamically significant stenosis. VERTEBRAL ARTERIES: Codominant configuration. Both origins are clearly patent. There is no dissection, occlusion or flow-limiting stenosis to the skull base (V1-V3 segments). CTA HEAD FINDINGS POSTERIOR CIRCULATION: --Vertebral arteries: Calcific atherosclerosis. --Inferior cerebellar arteries: Normal. --Basilar artery: Normal. --Superior cerebellar arteries: Normal. --Posterior cerebral arteries (PCA): Normal. ANTERIOR CIRCULATION: --Intracranial internal carotid arteries: Normal. --Anterior cerebral arteries (ACA): Normal. Both A1 segments are present. Patent anterior communicating artery (a-comm). --Middle cerebral arteries (MCA): Normal. VENOUS SINUSES: As permitted by contrast timing, patent. ANATOMIC VARIANTS: None Review of the MIP images confirms the above  findings. IMPRESSION: 1. No emergent large vessel occlusion or high-grade stenosis of the intracranial or cervical arteries. 2. Aortic Atherosclerosis (ICD10-I70.0). Electronically Signed   By: Ulyses Jarred M.D.   On: 04/14/2020 21:54   MR BRAIN WO CONTRAST  Result Date: 04/15/2020 CLINICAL DATA:  TIA. Right-sided numbness and tingling. History of stroke. EXAM: MRI HEAD WITHOUT CONTRAST TECHNIQUE: Multiplanar, multiecho pulse sequences of the brain and surrounding structures were obtained without intravenous contrast. COMPARISON:  CT head 04/14/2020 FINDINGS: Brain: Acute infarct left thalamus with patchy areas of restricted diffusion. No other acute infarct. Generalized atrophy. Chronic microvascular ischemic changes in the white matter. Chronic infarct left frontal lobe anteriorly. Small chronic infarcts in the cerebellum bilaterally. Chronic infarct right thalamus. Negative for hemorrhage or mass. No hydrocephalus. Vascular: Normal arterial flow voids. Skull and upper cervical spine: No focal skeletal lesion. Cervical degenerative changes. Sinuses/Orbits: Mucosal edema paranasal sinuses most notably in the left frontal sinus, left ethmoid and maxillary sinuses. No orbital lesion.  Bilateral cataract extraction. Other: None IMPRESSION: Acute infarct left thalamus Atrophy and chronic ischemic changes as above. Electronically  Signed   By: Franchot Gallo M.D.   On: 04/15/2020 13:33   ECHOCARDIOGRAM COMPLETE  Result Date: 04/15/2020    ECHOCARDIOGRAM REPORT   Patient Name:   Jordan [name] Date of Exam: 04/15/2020 Medical Rec #:  [id number]       Height:       68.0 in Accession #:    [id number]      Weight:       236.1 lb Date of Birth:  December 07, 1940       BSA:          2.193 m Patient Age:    36 years        BP:           112/63 mmHg Patient Gender: M               HR:           55 bpm. Exam Location:  Inpatient Procedure: 2D Echo, Cardiac Doppler and Color Doppler Indications:    TIA  History:        Patient  has prior history of Echocardiogram examinations, most                 recent 02/17/2018. CAD, Pacemaker and Abnormal ECG, TIA and                 Stroke, Signs/Symptoms:Dyspnea; Risk Factors:Sleep Apnea,                 Hypertension, Dyslipidemia and Diabetes.  Sonographer:    Roseanna Rainbow RDCS Referring Phys: [id number] JENNIFER YATES  Sonographer Comments: Technically difficult study due to poor echo windows and patient is morbidly obese. Image acquisition challenging due to patient body habitus. IMPRESSIONS  1. Left ventricular ejection fraction, by estimation, is 65 to 70%. The left ventricle has normal function. The left ventricle has no regional wall motion abnormalities. There is mild concentric left ventricular hypertrophy. Left ventricular diastolic parameters are consistent with Grade II diastolic dysfunction (pseudonormalization). Elevated left atrial pressure.  2. Right ventricular systolic function is normal. The right ventricular size is normal. There is mildly elevated pulmonary artery systolic pressure. The estimated right ventricular systolic pressure is 26.3 mmHg.  3. Left atrial size was mildly dilated.  4. The mitral valve is normal in structure. No evidence of mitral valve regurgitation. No evidence of mitral stenosis.  5. The aortic valve is normal in structure. Aortic valve regurgitation is mild. No aortic stenosis is present.  6. The inferior vena cava is dilated in size with <50% respiratory variability, suggesting right atrial pressure of 15 mmHg. FINDINGS  Left Ventricle: Left ventricular ejection fraction, by estimation, is 65 to 70%. The left ventricle has normal function. The left ventricle has no regional wall motion abnormalities. The left ventricular internal cavity size was normal in size. There is  mild concentric left ventricular hypertrophy. Left ventricular diastolic parameters are consistent with Grade II diastolic dysfunction (pseudonormalization). Elevated left atrial pressure. Right  Ventricle: The right ventricular size is normal. No increase in right ventricular wall thickness. Right ventricular systolic function is normal. There is mildly elevated pulmonary artery systolic pressure. The tricuspid regurgitant velocity is 1.98  m/s, and with an assumed right atrial pressure of 15 mmHg, the estimated right ventricular systolic pressure is 78.5 mmHg. Left Atrium: Left atrial size was mildly dilated. Right Atrium: Right atrial size was normal in size. Pericardium: There is no evidence of pericardial effusion. Mitral Valve: The mitral valve is normal in  structure. Normal mobility of the mitral valve leaflets. Mild mitral annular calcification. No evidence of mitral valve regurgitation. No evidence of mitral valve stenosis. Tricuspid Valve: The tricuspid valve is normal in structure. Tricuspid valve regurgitation is not demonstrated. No evidence of tricuspid stenosis. Aortic Valve: The aortic valve is normal in structure. Aortic valve regurgitation is mild. No aortic stenosis is present. Pulmonic Valve: The pulmonic valve was normal in structure. Pulmonic valve regurgitation is not visualized. No evidence of pulmonic stenosis. Aorta: The aortic root is normal in size and structure. Venous: The inferior vena cava is dilated in size with less than 50% respiratory variability, suggesting right atrial pressure of 15 mmHg. IAS/Shunts: No atrial level shunt detected by color flow Doppler. Additional Comments: A pacer wire is visualized.  LEFT VENTRICLE PLAX 2D LVIDd:         4.80 cm     Diastology LVIDs:         3.30 cm     LV e' lateral:   7.40 cm/s LV PW:         1.20 cm     LV E/e' lateral: 17.5 LV IVS:        1.40 cm     LV e' medial:    5.00 cm/s LVOT diam:     2.00 cm     LV E/e' medial:  25.9 LV SV:         67 LV SV Index:   31 LVOT Area:     3.14 cm  LV Volumes (MOD) LV vol d, MOD A2C: 75.7 ml LV vol d, MOD A4C: 96.6 ml LV vol s, MOD A2C: 20.7 ml LV vol s, MOD A4C: 25.2 ml LV SV MOD A2C:     55.0  ml LV SV MOD A4C:     96.6 ml LV SV MOD BP:      62.2 ml RIGHT VENTRICLE            IVC RV S prime:     6.74 cm/s  IVC diam: 2.60 cm TAPSE (M-mode): 1.4 cm LEFT ATRIUM             Index       RIGHT ATRIUM           Index LA diam:        3.90 cm 1.78 cm/m  RA Area:     12.10 cm LA Vol (A2C):   24.8 ml 11.31 ml/m RA Volume:   21.20 ml  9.67 ml/m LA Vol (A4C):   39.8 ml 18.15 ml/m LA Biplane Vol: 32.0 ml 14.59 ml/m  AORTIC VALVE LVOT Vmax:   131.00 cm/s LVOT Vmean:  98.300 cm/s LVOT VTI:    0.213 m  AORTA Ao Root diam: 3.90 cm Ao Asc diam:  3.30 cm MITRAL VALVE                TRICUSPID VALVE MV Area (PHT): 3.72 cm     TR Peak grad:   15.7 mmHg MV Decel Time: 204 msec     TR Vmax:        198.00 cm/s MV E velocity: 129.33 cm/s MV A velocity: 106.00 cm/s  SHUNTS MV E/A ratio:  1.22         Systemic VTI:  0.21 m                             Systemic Diam: 2.00 cm Dani Gobble Croitoru MD  Electronically signed by Sanda Klein MD Signature Date/Time: 04/15/2020/11:06:17 AM    Final      Charlesetta Ivory. [place]  If 7PM-7AM, please contact night-coverage [email or web address] Password [facility] 04/15/2020, 6:02 PM

## 2020-04-15 NOTE — Progress Notes (Signed)
SLP Cancellation Note  Patient Details Name: Jordan [name] MRN: [id number] DOB: [birth year]   Cancelled treatment:        Attempted to see pt for cognitive-linguistic evaluation.  Pt was asleep on SLP arrival, but was able to arouse and respond to conversation.  Pt denies changes to speech or memory.  Pt's speech was clear.  Pt did not open eyes during conversation and stated he would prefer SLP to return for evaluation later, but was agreeable to cognitive linguistic assessment.  SLP will return as schedule permits.   Celedonio Savage, MA, [place] Office: [phone]  04/15/2020, 11:06 AM

## 2020-04-15 NOTE — Progress Notes (Signed)
Nutrition Brief Note RD working remotely.   Consult received for TIA.   Wt Readings from Last 15 Encounters:  04/15/20 107.1 kg  12/11/19 108.4 kg  06/13/19 106.5 kg  05/25/19 104.3 kg  07/18/18 106.6 kg  05/17/18 106.6 kg  03/14/18 105.6 kg  02/17/18 106.2 kg  02/01/18 108.3 kg  04/14/17 107 kg  09/20/16 109 kg  06/16/16 109.1 kg  03/25/16 108.9 kg  03/12/16 109.9 kg  02/05/16 110.2 kg    Body mass index is 35.9 kg/m. Patient meets criteria for obesity based on current BMI. Weight today is 236 lb and weight on 12/11/19 was 238 lb. Skin WDL.   Current diet order is Heart Healthy/Carb Modified and patient consumed nearly all of breakfast this AM (breakfast meal provided 509 kcal and 27 grams protein).  Patient reports no changes in appetite/intakes or weight PTA. He was in his normal state of health (to include exercising at the [facility] several times/week) until the day of admission.   H&P documentation indicates that patient could be discharged as early as today dependent on response to treatment.   Labs and medications reviewed.   No nutrition interventions warranted at this time. If nutrition issues arise, please re-consult RD.      Jarome Matin, MS, RD, LDN, CNSC Inpatient Clinical Dietitian RD pager # available in [place]  After hours/weekend pager # available in [facility]

## 2020-04-15 NOTE — Progress Notes (Signed)
  Echocardiogram 2D Echocardiogram has been performed.  Jordan [name] 04/15/2020, 10:21 AM

## 2020-04-15 NOTE — Progress Notes (Signed)
Rehab Admissions Coordinator Note:  Per PT and OT recommendation, this patient was screened by Raechel Ache for appropriateness for an Inpatient Acute Rehab Consult.  At this time, we are recommending Inpatient Rehab consult. AC will place consult order in the chart per protocol.   Raechel Ache 04/15/2020, 2:56 PM  I can be reached at [phone].

## 2020-04-16 DIAGNOSIS — G47419 Narcolepsy without cataplexy: Secondary | ICD-10-CM

## 2020-04-16 DIAGNOSIS — Z6835 Body mass index (BMI) 35.0-35.9, adult: Secondary | ICD-10-CM

## 2020-04-16 DIAGNOSIS — D62 Acute posthemorrhagic anemia: Secondary | ICD-10-CM

## 2020-04-16 DIAGNOSIS — I639 Cerebral infarction, unspecified: Principal | ICD-10-CM

## 2020-04-16 DIAGNOSIS — G4733 Obstructive sleep apnea (adult) (pediatric): Secondary | ICD-10-CM

## 2020-04-16 DIAGNOSIS — I10 Essential (primary) hypertension: Secondary | ICD-10-CM

## 2020-04-16 DIAGNOSIS — E1159 Type 2 diabetes mellitus with other circulatory complications: Secondary | ICD-10-CM

## 2020-04-16 LAB — GLUCOSE, CAPILLARY
Glucose-Capillary: 129 mg/dL — ABNORMAL HIGH (ref 70–99)
Glucose-Capillary: 164 mg/dL — ABNORMAL HIGH (ref 70–99)
Glucose-Capillary: 172 mg/dL — ABNORMAL HIGH (ref 70–99)
Glucose-Capillary: 178 mg/dL — ABNORMAL HIGH (ref 70–99)

## 2020-04-16 NOTE — Progress Notes (Signed)
Inpatient Rehabilitation Admissions Coordinator  I met with patient at bedside for rehab assessment. We discussed goals and expectations of a possible inpt rehab admit. He is in agreement. I will begin insurance authorization. Admit pending approval and bed available. I will follow up tomorrow.  Danne Baxter, RN, MSN Rehab Admissions Coordinator [phone] 04/16/2020 2:22 PM

## 2020-04-16 NOTE — PMR Pre-admission (Addendum)
PMR Admission Coordinator Pre-Admission Assessment  Patient: Jordan [name] is an 79 y.o., male MRN: [id number] DOB: June 18, 1941 Height: 5\' 8"  (172.7 cm) Weight: 107.1 kg              Insurance Information  PRIMARY: [employer]      Policy#: [id number]      Subscriber: pt CM Name: Lattie Haw   Phone#: [phone] ext [phone]     NOM#:[id number] Pre-Cert#: [id number] approved for 7 days f/u with Jeanette Caprice ext [phone] same fax      Employer:  Benefits:  Phone #: [phone]     Name: 6/16 Eff. Date: 11/02/2019     Deduct: none      Out of Pocket Max: $3900      Life Max: none  CIR: $295 co pay per day days 1 until 6      SNF: no copay days 1 until 20; $184 co pay per day days 21 until 100 Outpatient: $10 to $40 per visit    Co-Pay: visits per medical neccesity Home Health: 100%      Co-Pay: visits per medical neccesity DME: 80%     Co-Pay: 20% Providers: in network  SECONDARY: none      Policy#:       Phone#:   Development worker, community:      Phone#:   The Engineer, petroleum" for patients in Inpatient Rehabilitation Facilities with attached "Privacy Act [place] Records" was provided and verbally reviewed with: Patient  Emergency Contact Information Contact Information    Name Relation Home Work Mobile   [place] Spouse [phone]  [phone]     Current Medical History  Patient Admitting Diagnosis:  CVA  History of Present Illness: 79 y.o. RH-male with history of CAD, T2DM, hypertension OSA-on CPAP, narcolepsy, SSS s/p PPM; who was admitted on 04/14/2020 with right-sided numbness and tingling.  BP noted to be elevated on admission at 204/116 upon arrival.  CT head unremarkable for acute intracranial process.  CTA head/neck negative for LVO or high grade stenosis.  Echocardiogram with ejection fraction of 65-70 percent with grade II DD and elevated left atrial pressure.  UDS negative. MRI brain showed acute left thalamic infarct and PPM interrogation  pending. Dr. Erlinda Hong felt that stroke secondary to small vessel disease and ASA added to Plavix with recommendations to d/c ASA after 3 weeks. Pacemaker interrogated with no afib noted.Therapy evaluations revealed balance deficits with poor safety awareness. Lovenox for VTE prophylaxis. Hospital course was complicated by acute blood loss anemia.  Complete NIHSS TOTAL: 1   Past Medical History  Past Medical History:  Diagnosis Date  . Arthritis    R shoulder, bone spur  . Arthritis    "hands" (03/25/2016)  . Chronic lower back pain   . Coronary heart disease    Dr Pernell Dupre  . H/O cardiovascular stress test    perhaps last one was 2009  . Heart murmur    12/29/11 echo: mild MR, no AS, trivial TR  . History of gout   . Hyperlipidemia   . Hypertension    saw Dr. Linard Millers last earl;y- 2014, cardiac cath. last done ?2009, blocks seen didn't require any intervention at that point.  . Narcolepsy    [name] 04-11-97; Mean Latency 1.77min, SOREM 2  . OSA on CPAP    NPSG 12-13-98 AHI 22.7  . PONV (postoperative nausea and vomiting)   . Presence of permanent cardiac pacemaker   . Shortness of breath    with  exertion   . Stroke ([place])   . Type II diabetes mellitus (HCC)    Family History  family history includes Colon cancer in his sister; Sleep apnea in his sister.  Prior Rehab/Hospitalizations:  Has the patient had prior rehab or hospitalizations prior to admission? Yes  Has the patient had major surgery during 100 days prior to admission? No  Current Medications   Current Facility-Administered Medications:  .  acetaminophen (TYLENOL) tablet 650 mg, 650 mg, Oral, Q4H PRN **OR** acetaminophen (TYLENOL) 160 MG/5ML solution 650 mg, 650 mg, Per Tube, Q4H PRN **OR** acetaminophen (TYLENOL) suppository 650 mg, 650 mg, Rectal, Q4H PRN, Karmen Bongo, MD .  aspirin EC tablet 81 mg, 81 mg, Oral, Daily, Rosalin Hawking, MD, 81 mg at 04/18/20 0929 .  atorvastatin (LIPITOR) tablet 40 mg, 40 mg, Oral, QAC  breakfast, Karmen Bongo, MD, 40 mg at 04/18/20 0800 .  clopidogrel (PLAVIX) tablet 75 mg, 75 mg, Oral, Daily, Karmen Bongo, MD, 75 mg at 04/18/20 0929 .  enoxaparin (LOVENOX) injection 40 mg, 40 mg, Subcutaneous, Q24H, Karmen Bongo, MD, 40 mg at 04/17/20 1822 .  insulin aspart (novoLOG) injection 0-15 Units, 0-15 Units, Subcutaneous, TID WC, Karmen Bongo, MD, 3 Units at 04/18/20 1155 .  insulin aspart (novoLOG) injection 0-5 Units, 0-5 Units, Subcutaneous, QHS, Karmen Bongo, MD .  insulin glargine (LANTUS) injection 5 Units, 5 Units, Subcutaneous, BID, Mercy Riding, MD, 5 Units at 04/18/20 0929 .  senna-docusate (Senokot-S) tablet 1 tablet, 1 tablet, Oral, QHS PRN, Karmen Bongo, MD  Patients Current Diet:  Diet Order            Diet - low sodium heart healthy           Diet Carb Modified           Diet heart healthy/carb modified Room service appropriate? Yes; Fluid consistency: Thin  Diet effective ____                Precautions / Restrictions Precautions Precautions: Fall Precaution Comments: R sided inattention  Restrictions Weight Bearing Restrictions: No   Has the patient had 2 or more falls or a fall with injury in the past year?No  Prior Activity Level Community (5-7x/wk): Independent; driving  Prior Functional Level Prior Function Level of Independence: Independent Comments: 41 year old son with Aspergers; wife 40 years old with hip replacement surgery end of June  Self Care: Did the patient need help bathing, dressing, using the toilet or eating?  Independent  Indoor Mobility: Did the patient need assistance with walking from room to room (with or without device)? Independent  Stairs: Did the patient need assistance with internal or external stairs (with or without device)? Independent  Functional Cognition: Did the patient need help planning regular tasks such as shopping or remembering to take medications? Independent  Home Assistive  Devices / Equipment Home Assistive Devices/Equipment: None Home Equipment: Walker - 2 wheels, Cane - single point  Prior Device Use: Indicate devices/aids used by the patient prior to current illness, exacerbation or injury? None of the above  Current Functional Level Cognition  Arousal/Alertness: Awake/alert Overall Cognitive Status: Within Functional Limits for tasks assessed Current Attention Level: Sustained Orientation Level: Oriented X4 Following Commands: Follows one step commands consistently, Follows one step commands with increased time, Follows multi-step commands consistently Safety/Judgement: Decreased awareness of safety, Decreased awareness of deficits General Comments: a little impulsive and decreased safety awareness but otherwise [facility] for mobility tasks Attention: Focused Focused Attention: Appears intact Memory:  Appears intact Awareness: Appears intact Problem Solving: Appears intact Executive Function: Reasoning Reasoning: Appears intact Safety/Judgment: Appears intact    Extremity Assessment (includes Sensation/Coordination)  Upper Extremity Assessment: RUE deficits/detail RUE Deficits / Details: grossly 3/5 MMT, decreased sensation, coordination and functionally dropping items at sink  RUE Sensation: decreased light touch, decreased proprioception RUE Coordination: decreased fine motor, decreased gross motor  Lower Extremity Assessment: Defer to PT evaluation RLE Coordination: decreased gross motor    ADLs  Overall ADL's : Needs assistance/impaired Grooming: Oral care, Minimal assistance, Standing Grooming Details (indicate cue type and reason): min assist to manage items at sink, at times impaired balance with R knee buckling and required min-mod assist for balance  Upper Body Bathing: Minimal assistance, Sitting Lower Body Bathing: Sit to/from stand, Moderate assistance Lower Body Bathing Details (indicate cue type and reason): figure 4 technique for  feet, poor balance dynamically, min assist sit to stand  Upper Body Dressing : Moderate assistance, Sitting Upper Body Dressing Details (indicate cue type and reason): to don new gown  Lower Body Dressing: Maximal assistance, Sit to/from stand Lower Body Dressing Details (indicate cue type and reason): figure 4 technique to adjust socks, relies on UE and external support in standing; min assist for sit to stand  Toilet Transfer: Minimal assistance, Ambulation Toilet Transfer Details (indicate cue type and reason): simulated in room Toileting - Clothing Manipulation Details (indicate cue type and reason): incontinent of bladder at sink, reports normally wearing briefs at home; total assist to clean up Functional mobility during ADLs: Minimal assistance, Cueing for safety, Cueing for sequencing General ADL Comments: pt limited by R sided weakness and coordnation, impaired balance, decreased activity tolerance    Mobility  Overal bed mobility: Modified Independent Bed Mobility: Supine to Sit Supine to sit: Supervision Sit to supine: Supervision General bed mobility comments: HOB elevated and use of rail     Transfers  Overall transfer level: Needs assistance Equipment used: None Transfers: Sit to/from Stand Sit to Stand: Min assist Stand pivot transfers: Min assist General transfer comment: assist to steady from EOB and recliner    Ambulation / Gait / Stairs / Wheelchair Mobility  Ambulation/Gait Ambulation/Gait assistance: Min assist, Mod assist Gait Distance (Feet):  (90 ft X 2 trials with seated break) Assistive device: Straight cane (assist at trunk with gait belt) Gait Pattern/deviations: Decreased stride length, Step-through pattern, Decreased step length - right, Drifts right/left General Gait Details: pt with varying gait speeds and often moving too quickly givne deficits; pt with anterior bias and cues required for decreased gait speed, uprigh posture/forward gaze, and  sequencing; required assistance for balance  Gait velocity: decr    Posture / Balance Balance Overall balance assessment: Needs assistance Sitting-balance support: Feet supported Sitting balance-Leahy Scale: Good Standing balance support: Single extremity supported, During functional activity Standing balance-Leahy Scale: Poor Standing balance comment: relies on external support    Special needs/care consideration CPAP pta Hgb A1c 7.9 Designated visitors are [employer] and [employer]   Previous Home Environment  Living Arrangements: Spouse/significant other, Children  Lives With: Spouse, Son Available Help at Discharge: Family Type of Home: Apartment Home Layout: One level Home Access: Building control surveyor Shower/Tub: Public librarian, Multimedia programmer: Standard Bathroom Accessibility: Yes How Accessible: Accessible via walker Home Care Services: No Additional Comments: wife 74 years old and assisting with her Aunt at ALF in [place]; wife to have hip replacement end of June  Discharge Living Setting Plans for Discharge Living Setting: Patient's home, Apartment, Lives  with (comment) (wife and 47 year old son with aspergers) Type of Home at Discharge: Apartment Discharge Home Layout: One level Discharge Home Access: Elevator, Level entry Discharge Bathroom Shower/Tub: Tub/shower unit, Walk-in shower Discharge Bathroom Toilet: Standard Discharge Bathroom Accessibility: Yes How Accessible: Accessible via walker Does the patient have any problems obtaining your medications?: No  Social/Family/Support Systems Patient Roles: Spouse, Parent Contact Information: wife, [name] Anticipated Caregiver: wife and 32 year old son with aspergers Anticipated Caregiver's Contact Information: see above Ability/Limitations of Caregiver: wife to have hip surgery end of June; 69 year old son has aspergers Caregiver Availability: 24/7 Discharge Plan Discussed with Primary Caregiver: Yes Is  Caregiver In Agreement with Plan?: Yes Does Caregiver/Family have Issues with Lodging/Transportation while Pt is in Rehab?: No  Goals Patient/Family Goal for Rehab: Mod I to supervision with PT and OT Expected length of stay: ELOS 5 to 9 days Pt/Family Agrees to Admission and willing to participate: Yes Program Orientation Provided & Reviewed with Pt/Caregiver Including Roles  & Responsibilities: Yes  Decrease burden of Care through IP rehab admission: n/a  Possible need for SNF placement upon discharge:not anticipated  Patient Condition: This patient's condition remains as documented in the consult dated 04/16/2020, in which the Rehabilitation Physician determined and documented that the patient's condition is appropriate for intensive rehabilitative care in an inpatient rehabilitation facility. Will admit to inpatient rehab today.  Preadmission Screen Completed By:  Cleatrice Burke, RN, 04/18/2020 11:56 AM ______________________________________________________________________   Discussed status with Dr. Posey Pronto on 04/18/2020 at 1157 and received approval for admission today.  Admission Coordinator:  Cleatrice Burke, time 6728 Date 04/18/2020

## 2020-04-16 NOTE — Progress Notes (Signed)
PROGRESS NOTE  NATION CRADLE QIW:[id number] DOB: 1941/06/07   PCP: [name], MD  Patient is from: Home.  DOA: 04/14/2020 LOS: 1  Brief Narrative / Interim history: 79 year old male with history of DM-2, CVA, OSA on CPAP, narcolepsy, SSS/PPM and morbid obesity presenting with right-sided numbness and tingling more in the arm than legs and admitted for CVA work-up.  On arrival to ED, hemodynamically stable although his blood pressure was elevated later on.  Labs including CMP and CBC with differential without significant finding other than elevated glucose.  UA, COVID-19 PCR, UDS, CT head without contrast and CTA head and neck without acute finding.  Neurology consulted and admitted for CVA work-up.   The next day, MRI brain revealed acute left thalamic stroke.  Echo with EF of 65 to 70%, G2 DD and RVSP of 31 mmHg but no PFO or thrombus.  A1c 7.9%.  LDL 57.  TSH within normal.  Evaluated by therapy who recommended CIR.  Subjective: Seen and examined earlier this morning.  No major events overnight of this morning.  Reports improvement in his numbness.  Denies headache, vision change or other focal neuro symptoms.  Denies chest pain, dyspnea, GI or UTI symptoms.  Objective: Vitals:   04/15/20 1931 04/16/20 0015 04/16/20 0353 04/16/20 0806  BP: (!) 161/84 (!) 152/98 (!) 159/88 (!) 172/95  Pulse: 61 65 61 61  Resp: (!) 22 18 18 18   Temp: 98.4 F (36.9 C) 98.5 F (36.9 C) 97.7 F (36.5 C) 97.7 F (36.5 C)  TempSrc: Oral  Oral Oral  SpO2: 98% 94% 93% 97%  Weight:      Height:        Intake/Output Summary (Last 24 hours) at 04/16/2020 1144 Last data filed at 04/16/2020 0353 Gross per 24 hour  Intake 1240 ml  Output 1525 ml  Net -285 ml   Filed Weights   04/14/20 1200 04/14/20 1302 04/15/20 0621  Weight: 107.3 kg 107.3 kg 107.1 kg    Examination:  GENERAL: No apparent distress.  Nontoxic. HEENT: MMM.  Vision and hearing grossly intact.  NECK: Supple.  No apparent  JVD.  RESP:  No IWOB.  Fair aeration bilaterally. CVS:  RRR. Heart sounds normal.  ABD/GI/GU: BS+. Abd soft, NTND.  MSK/EXT:  Moves extremities. No apparent deformity. No edema.  SKIN: no apparent skin lesion or wound NEURO: Awake, alert and oriented appropriately.  CN II-XII intact.  Slightly diminished light sensation in the right arm.  Motor 5/5 in all extremities.  No pronator drift.  Finger-to-nose intact.  Patellar reflex symmetric. PSYCH: Calm. Normal affect.  Procedures:  None  Microbiology summarized: COVID-19 PCR negative.  Assessment & Plan: Right-sided paresthesia/acute left thalamic stroke-noted on MRI brain.  CTA head and neck without acute finding.  Echo with EF of 65 to 70%, G2 DD and RVSP of 31 mmHg but no PFO or thrombus.  A1c 7.9%.  LDL 57.  TSH within normal.  UDS negative.  No arrhythmia on PPM interrogation. -Neurology following and recommended DAPT Plavix and low aspirin for 3 weeks followed by Plavix alone. -Optimize risk factors -Permissive hypertension up to 180/105 for 2 to 3 days. -Therapy recommended CIR. -Continue statin.  Uncontrolled DM-2 with hyperglycemia: A1c 7.9%. 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  Appears euvolemic.  Good urine  output without diuretics. -Monitor fluid status. -May benefit from as needed diuretics down the road.. -Optimize BP control.  Essential hypertension: BP slightly elevated. -Permissive hypertension as above -Continue IV fluid  OSA on CPAP-compliant. -Continue CPAP  Narcolepsy -Continue home medications  SSS s/p PPM-reportedly no events on interrogation.   Morbid obesity: Body mass index is 35.9 kg/m.  With diabetes. -Encourage lifestyle change to lose weight.          DVT prophylaxis:  enoxaparin  (LOVENOX) injection 40 mg Start: 04/14/20 1800  Code Status: Full code Family Communication: Updated patient's wife over the phone from patient's phone on 6/15.. Status is: Inpatient  Remains inpatient appropriate because:Unsafe d/c plan   Dispo: The patient is from: Home              Anticipated d/c is to: CIR              Anticipated d/c date is: 1 day              Patient currently is medically stable to d/c.       Consultants:  Neurology   Sch Meds:  Scheduled Meds: . aspirin EC  81 mg Oral Daily  . atorvastatin  40 mg Oral QAC breakfast  . clopidogrel  75 mg Oral Daily  . enoxaparin (LOVENOX) injection  40 mg Subcutaneous Q24H  . insulin aspart  0-15 Units Subcutaneous TID WC  . insulin aspart  0-5 Units Subcutaneous QHS  . insulin glargine  5 Units Subcutaneous BID   Continuous Infusions: . sodium chloride 50 mL/hr at 04/14/20 1601   PRN Meds:.acetaminophen **OR** acetaminophen (TYLENOL) oral liquid 160 mg/5 mL **OR** acetaminophen, senna-docusate  Antimicrobials: Anti-infectives (From admission, onward)   None       I have personally reviewed the following labs and images: CBC: Recent Labs  Lab 04/14/20 1238 04/14/20 1243  WBC 8.4  --   NEUTROABS 4.7  --   HGB 13.2 12.9*  HCT 40.2 38.0*  MCV 88.7  --   PLT 282  --    BMP &GFR Recent Labs  Lab 04/14/20 1238 04/14/20 1243  NA 139 141  K 4.0 3.9  CL 108 107  CO2 21*  --   GLUCOSE 183* 181*  BUN 12 11  CREATININE 1.01 0.90  CALCIUM 9.4  --    Estimated Creatinine Clearance: 77.7 mL/min (by C-G formula based on SCr of 0.9 mg/dL). Liver & Pancreas: Recent Labs  Lab 04/14/20 1238  AST 33  ALT 23  ALKPHOS 59  BILITOT 0.8  PROT 6.6  ALBUMIN 3.5   No results for input(s): LIPASE, AMYLASE in the last 168 hours. No results for input(s): AMMONIA in the last 168 hours. Diabetic: Recent Labs    04/14/20 1238  HGBA1C 7.9*   Recent Labs  Lab 04/15/20 0618 04/15/20 1301  04/15/20 1739 04/15/20 2116 04/16/20 0633  GLUCAP 130* 149* 148* 177* 129*   Cardiac Enzymes: No results for input(s): CKTOTAL, CKMB, CKMBINDEX, TROPONINI in the last 168 hours. No results for input(s): PROBNP in the last 8760 hours. Coagulation Profile: Recent Labs  Lab 04/14/20 1238  INR 1.1   Thyroid Function Tests: Recent Labs    04/14/20 1700  TSH 0.976   Lipid Profile: Recent Labs    04/15/20 0724  CHOL 115  HDL 32*  LDLCALC 57  TRIG 130  CHOLHDL 3.6   Anemia Panel: No results for input(s): VITAMINB12, FOLATE, FERRITIN, TIBC, IRON, RETICCTPCT in the last 72  hours. Urine analysis:    Component Value Date/Time   COLORURINE YELLOW 04/14/2020 1700   APPEARANCEUR CLEAR 04/14/2020 1700   LABSPEC 1.014 04/14/2020 1700   PHURINE 5.0 04/14/2020 1700   GLUCOSEU NEGATIVE 04/14/2020 1700   HGBUR NEGATIVE 04/14/2020 1700   BILIRUBINUR NEGATIVE 04/14/2020 1700   KETONESUR NEGATIVE 04/14/2020 1700   PROTEINUR NEGATIVE 04/14/2020 1700   NITRITE NEGATIVE 04/14/2020 1700   LEUKOCYTESUR NEGATIVE 04/14/2020 1700   Sepsis Labs: Invalid input(s): PROCALCITONIN, [place]  Microbiology: Recent Results (from the past 240 hour(s))  SARS Coronavirus 2 by RT PCR (hospital order, performed in [facility] hospital lab) Nasopharyngeal Nasopharyngeal Swab     Status: None   Collection Time: 04/14/20  3:33 PM   Specimen: Nasopharyngeal Swab  Result Value Ref Range Status   SARS Coronavirus 2 NEGATIVE NEGATIVE Final    Comment: (NOTE) SARS-CoV-2 target nucleic acids are NOT DETECTED.  The SARS-CoV-2 RNA is generally detectable in upper and lower respiratory specimens during the acute phase of infection. The lowest concentration of SARS-CoV-2 viral copies this assay can detect is 250 copies / mL. A negative result does not preclude SARS-CoV-2 infection and should not be used as the sole basis for treatment or other patient management decisions.  A negative result may occur  with improper specimen collection / handling, submission of specimen other than nasopharyngeal swab, presence of viral mutation(s) within the areas targeted by this assay, and inadequate number of viral copies (<250 copies / mL). A negative result must be combined with clinical observations, patient history, and epidemiological information.  Fact Sheet for Patients:   StrictlyIdeas.no  Fact Sheet for Healthcare Providers: BankingDealers.co.za  This test is not yet approved or  cleared by the [place] FDA and has been authorized for detection and/or diagnosis of SARS-CoV-2 by FDA under an Emergency Use Authorization (EUA).  This EUA will remain in effect (meaning this test can be used) for the duration of the COVID-19 declaration under Section 564(b)(1) of the Act, 21 U.S.C. section 360bbb-3(b)(1), unless the authorization is terminated or revoked sooner.  Performed at [facility], [place] [address] [place], [place] [address]     Radiology Studies: MR BRAIN WO CONTRAST  Result Date: 04/15/2020 CLINICAL DATA:  TIA. Right-sided numbness and tingling. History of stroke. EXAM: MRI HEAD WITHOUT CONTRAST TECHNIQUE: Multiplanar, multiecho pulse sequences of the brain and surrounding structures were obtained without intravenous contrast. COMPARISON:  CT head 04/14/2020 FINDINGS: Brain: Acute infarct left thalamus with patchy areas of restricted diffusion. No other acute infarct. Generalized atrophy. Chronic microvascular ischemic changes in the white matter. Chronic infarct left frontal lobe anteriorly. Small chronic infarcts in the cerebellum bilaterally. Chronic infarct right thalamus. Negative for hemorrhage or mass. No hydrocephalus. Vascular: Normal arterial flow voids. Skull and upper cervical spine: No focal skeletal lesion. Cervical degenerative changes. Sinuses/Orbits: Mucosal edema paranasal sinuses most notably in the left frontal  sinus, left ethmoid and maxillary sinuses. No orbital lesion.  Bilateral cataract extraction. Other: None IMPRESSION: Acute infarct left thalamus Atrophy and chronic ischemic changes as above. Electronically Signed   By: Franchot Gallo M.D.   On: 04/15/2020 13:33     Chrystel Barefield T. Pattison  If 7PM-7AM, please contact night-coverage [email or web address] Password [facility] 04/16/2020, 11:44 AM

## 2020-04-16 NOTE — Progress Notes (Signed)
Physical Therapy Treatment Patient Details Name: Jordan [name] MRN: [id number] DOB: 08-02-1941 Today's Date: 04/16/2020    History of Present Illness Jordan [name] is a 79 y.o. male with medical history significant of DM; CVA: OSA on CPAP; narcolepsy; pacemaker; HTN; HLD; and CAD presenting with R-sided numbness/tingling. CT negative, MRI pending.     PT Comments    Pt able to mobilize with less assist today and increased ambulation distance w/o AD. He is very motivated and willing to participate with therapy. Pt continues to require assist for stability and demonstrates decreased awareness of safety and deficits. Patient would benefit from continued skilled PT to maximize functional independence and saftey with mobility. Will continue to follow acutely.      Follow Up Recommendations  CIR     Equipment Recommendations  Other (comment) (TBD)    Recommendations for Other Services Rehab consult     Precautions / Restrictions Precautions Precautions: Fall Precaution Comments: R sided inattention     Mobility  Bed Mobility Overal bed mobility: Needs Assistance Bed Mobility: Supine to Sit     Supine to sit: Supervision     General bed mobility comments: for safety and technique with HOB elevated  Transfers Overall transfer level: Needs assistance Equipment used: None Transfers: Sit to/from [facility] Sit to Stand: Min assist;Min guard Stand pivot transfers: Min assist       General transfer comment: Min A to stand with cues for hand placement. Pt able to progress to min guard after sit<>stand practice. Min A for stability with pivot to recliner chair.  Ambulation/Gait Ambulation/Gait assistance: Min assist Gait Distance (Feet): 80 Feet Assistive device: IV Pole Gait Pattern/deviations: Step-to pattern;Decreased step length - right;Trunk flexed;Narrow base of support;Decreased stride length;Staggering right Gait velocity: decr   General Gait  Details: No knee buckling noted this session. pt staggering to the R and reports the RLE does not feel like it is working correctly, but cannot state why. Increased reliance on IV pole for support as pt fatigues. Needed to sit abruptly in hallway. Chair close to follow. Cues for forward gaze to improve balance.   Stairs             Wheelchair Mobility    Modified Rankin (Stroke Patients Only) Modified Rankin (Stroke Patients Only) Pre-Morbid Rankin Score: No symptoms Modified Rankin: Moderately severe disability     Balance Overall balance assessment: Needs assistance Sitting-balance support: Feet supported Sitting balance-Leahy Scale: Fair     Standing balance support: Single extremity supported;During functional activity;No upper extremity supported Standing balance-Leahy Scale: Poor Standing balance comment: relies on external support                            Cognition Arousal/Alertness: Awake/alert Behavior During Therapy: Flat affect Overall Cognitive Status: Impaired/Different from baseline Area of Impairment: Memory;Following commands;Safety/judgement;Awareness;Problem solving;Attention                   Current Attention Level: Sustained Memory: Decreased short-term memory;Decreased recall of precautions Following Commands: Follows one step commands consistently;Follows one step commands with increased time;Follows multi-step commands consistently Safety/Judgement: Decreased awareness of safety;Decreased awareness of deficits Awareness: Emergent Problem Solving: Slow processing;Decreased initiation;Difficulty sequencing;Requires verbal cues;Requires tactile cues General Comments: Pt shows decreased awareness of safety and deficits. He frequently attempts bending over to pick things off the floor despite cueing form PTA. Pleasent, but has an "I can do it myself" attitude when he needs to  be accepting help.      Exercises Other  Exercises Other Exercises: sit<>stand 3 ways for neuromuscular re-ed; 5x normal; 3x LLE to the side; 3x LLE forward.    General Comments        Pertinent Vitals/Pain      Home Living                      Prior Function            PT Goals (current goals can now be found in the care plan section) Acute Rehab PT Goals Patient Stated Goal: to improve, go to rehab PT Goal Formulation: With patient Time For Goal Achievement: 04/29/20 Potential to Achieve Goals: Good Progress towards PT goals: Progressing toward goals    Frequency    Min 4X/week      PT Plan Current plan remains appropriate    Co-evaluation              AM-PAC PT "6 Clicks" Mobility   Outcome Measure  Help needed turning from your back to your side while in a flat bed without using bedrails?: A Little Help needed moving from lying on your back to sitting on the side of a flat bed without using bedrails?: A Little Help needed moving to and from a bed to a chair (including a wheelchair)?: A Lot Help needed standing up from a chair using your arms (e.g., wheelchair or bedside chair)?: A Little Help needed to walk in hospital room?: A Little Help needed climbing 3-5 steps with a railing? : Total 6 Click Score: 15    End of Session Equipment Utilized During Treatment: Gait belt Activity Tolerance: Patient tolerated treatment well Patient left: with call bell/phone within reach;in chair;with chair alarm set;with nursing/sitter in room Nurse Communication: Mobility status (male cath removed, can begin getting up w/ RN to toilet.) PT Visit Diagnosis: Unsteadiness on feet (R26.81);Muscle weakness (generalized) (M62.81);Other abnormalities of gait and mobility (R26.89);Difficulty in walking, not elsewhere classified (R26.2);Hemiplegia and hemiparesis Hemiplegia - Right/Left: Right Hemiplegia - dominant/non-dominant: Dominant Hemiplegia - caused by: Unspecified;Cerebral infarction     Time:  0940-7680 PT Time Calculation (min) (ACUTE ONLY): 34 min  Charges:  $Gait Training: 8-22 mins $Neuromuscular Re-education: 8-22 mins                     Benjiman Core, [place] Pager [phone] Acute Rehab    Allena Katz 04/16/2020, 2:13 PM

## 2020-04-16 NOTE — Consult Note (Signed)
Physical Medicine and Rehabilitation Consult   Reason for Consult: Stroke with functional deficits.  Referring Physician: Dr. Cyndia Skeeters   HPI: Jordan [name] is a 80 y.o. 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  BP noted to be elevated on admission.  CT head unremarkable for acute intracranial process.  CTA head/neck negative for LVO or high grade stenosis.  Echocardiogram with ejection fraction of 65-70 percent with grade II DD and elevated left atrial pressure.  UDS negative. 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 Dr. Erlinda Hong felt that stroke secondary to small vessel disease and ASA added to Plavix with recommendations to d/c ASA after 3 weeks. Therapy evaluations revealed balance deficits with poor safety awareness.  Hospital course was complicated by acute blood loss anemia.  CIR recommended due to functional decline.   Review of Systems  Constitutional: Negative for chills and fever.  HENT: Positive for tinnitus.   Eyes: Negative for blurred vision and double vision.  Respiratory: Positive for shortness of breath.   Cardiovascular: Negative for chest pain and palpitations.  Gastrointestinal: Positive for diarrhea and heartburn. Negative for nausea.  Genitourinary: Negative for dysuria and urgency.  Musculoskeletal: Positive for back pain (chronic--goes to get adjustment) and joint pain (occasional hip pain). Negative for myalgias and neck pain.       BLE radiculopathy  Skin: Negative for itching and rash.  Neurological: Positive for dizziness (with quick movements) and sensory change (right). Negative for speech change, focal weakness and headaches.  Psychiatric/Behavioral: The patient does not have insomnia.   All other systems reviewed and are negative.    Past Medical History:  Diagnosis Date  . Arthritis    R shoulder, bone spur  . Arthritis     "hands" (03/25/2016)  . Chronic lower back pain   . Coronary heart disease    Dr Pernell Dupre  . H/O cardiovascular stress test    perhaps last one was 2009  . Heart murmur    12/29/11 echo: mild MR, no AS, trivial TR  . History of gout   . Hyperlipidemia   . Hypertension    saw Dr. Linard Millers last earl;y- 2014, cardiac cath. last done ?2009, blocks seen didn't require any intervention at that point.  . Narcolepsy    [name] 04-11-97; Mean Latency 1.22min, SOREM 2  . OSA on CPAP    NPSG 12-13-98 AHI 22.7  . PONV (postoperative nausea and vomiting)   . Presence of permanent cardiac pacemaker   . Shortness of breath    with exertion   . Stroke ([place])   . Type II diabetes mellitus ([place])     Past Surgical History:  Procedure Laterality Date  . CARDIAC CATHETERIZATION  2009?  . EP IMPLANTABLE DEVICE N/A 03/25/2016   Procedure: Pacemaker Implant - Dual Chamber;  Surgeon: Evans Lance, MD;  Location: [facility];  Service: Cardiovascular;  Laterality: N/A;  . FRACTURE SURGERY    . INSERT / REPLACE / REMOVE PACEMAKER  03/24/2016  . LEFT AND RIGHT HEART CATHETERIZATION WITH CORONARY ANGIOGRAM N/A 06/05/2014   Procedure: LEFT AND RIGHT HEART CATHETERIZATION WITH CORONARY ANGIOGRAM;  Surgeon: Sinclair Grooms, MD;  Location: [facility];  Service: Cardiovascular;  Laterality: N/A;  . NASAL FRACTURE SURGERY    . SHOULDER ARTHROSCOPY WITH ROTATOR CUFF REPAIR AND SUBACROMIAL DECOMPRESSION Right 08/16/2013   Procedure: SHOULDER  ARTHROSCOPY WITH ROTATOR CUFF REPAIR AND SUBACROMIAL DECOMPRESSION;  Surgeon: [name], MD;  Location: [facility];  Service: Orthopedics;  Laterality: Right;  Right shoulder arthroscopy rotator cuff repair, subacromial decompression.  . TONSILLECTOMY      Family History  Problem Relation Age of Onset  . Sleep apnea Sister   . Colon cancer Sister     Social History:  Jordan [name] is 79 yrs old and checks on aunt in ALF.  Wife is scheduled to have cataract  surgery as well as hip replacement in the near future.  He is retired --used to be an Herbalist. He was independent without AD--goes to the [facility] 3 X wk. [name] He reports that he quit smoking about 52 years ago. His smoking use included cigarettes. He has a 15.00 pack-year smoking history. He has never used smokeless tobacco. He reports current alcohol use--last 3-4 months ago.  He reports that he does not use drugs.    Allergies: No Known Allergies    Medications Prior to Admission  Medication Sig Dispense Refill  . amphetamine-dextroamphetamine (ADDERALL) 10 MG tablet 1-3 tabs daily as needed (Patient taking differently: Take 10 mg by mouth See admin instructions. 1-3 tabs daily as needed) 90 tablet 0  . atorvastatin (LIPITOR) 40 MG tablet Take 20 mg by mouth daily before breakfast.     . clopidogrel (PLAVIX) 75 MG tablet Take 1 tablet (75 mg total) by mouth daily. 30 tablet 1  . glucosamine-chondroitin 500-400 MG tablet Take 2 tablets by mouth daily.    [name] lisinopril (ZESTRIL) 10 MG tablet Take 10 mg by mouth daily.    . metFORMIN (GLUCOPHAGE-XR) 500 MG 24 hr tablet Take 2,000 mg by mouth daily with breakfast.     . Multiple Vitamin (MULTIVITAMIN WITH MINERALS) TABS tablet Take 1 tablet by mouth daily.    [name] NITROSTAT 0.4 MG SL tablet PLACE 1 TABLET (0.4 MG TOTAL) UNDER THE TONGUE EVERY 5 (FIVE) MINUTES AS NEEDED FOR CHEST PAIN. (Patient taking differently: Place 0.4 mg under the tongue every 5 (five) minutes as needed for chest pain. ) 25 tablet 5  . Omega 3 1200 MG CAPS Take 7,200 mg by mouth daily.       Home: Home Living Family/patient expects to be discharged to:: Private residence Living Arrangements: Spouse/significant other, Children Available Help at Discharge: Family, Available 24 hours/day Type of Home: Apartment Home Access: Elevator (to 10th floor) Home Layout: One level Bathroom Shower/Tub: Tub/shower unit, Multimedia programmer: Standard Home Equipment: Environmental consultant -  2 wheels, Cane - single point Additional Comments: spouse has been taking care of her mother in [place] and has 2 scheduled surgeries in [place] History: Prior Function Level of Independence: Independent Comments: driving, retired; son has Landscape architect Status:  Mobility: Bed Mobility Overal bed mobility: Needs Assistance Bed Mobility: Supine to Sit Supine to sit: Supervision Sit to supine: Supervision General bed mobility comments: for safety and technique with HOB elevated Transfers Overall transfer level: Needs assistance Equipment used: None Transfers: Sit to/from Stand Sit to Stand: Min assist General transfer comment: min assist to power up and steady from EOB Ambulation/Gait Ambulation/Gait assistance: Mod assist Gait Distance (Feet): 75 Feet Assistive device: Rolling walker (2 wheeled) Gait Pattern/deviations: Step-to pattern, Decreased step length - right, Trunk flexed, Narrow base of support, Decreased stride length General Gait Details: decreased step on right, running into things on his right. Gets outside of RW, difficulty keeping Right hand on walker. Decreased safety awareness.  R LE buckling occasioanlly with ambulation. Gait velocity: decr    ADL: ADL Overall ADL's : Needs assistance/impaired Grooming: Oral care, Minimal assistance, Standing Grooming Details (indicate cue type and reason): min assist to manage items at sink, at times impaired balance with R knee buckling and required min-mod assist for balance  Upper Body Bathing: Minimal assistance, Sitting Lower Body Bathing: Sit to/from stand, Moderate assistance Lower Body Bathing Details (indicate cue type and reason): figure 4 technique for feet, poor balance dynamically, min assist sit to stand  Upper Body Dressing : Moderate assistance, Sitting Upper Body Dressing Details (indicate cue type and reason): to don new gown  Lower Body Dressing: Maximal assistance, Sit to/from stand Lower  Body Dressing Details (indicate cue type and reason): figure 4 technique to adjust socks, relies on UE and external support in standing; min assist for sit to stand  Toilet Transfer: Minimal assistance, Ambulation Toilet Transfer Details (indicate cue type and reason): simulated in room Toileting - Clothing Manipulation Details (indicate cue type and reason): incontinent of bladder at sink, reports normally wearing briefs at home; total assist to clean up Functional mobility during ADLs: Minimal assistance, Cueing for safety, Cueing for sequencing General ADL Comments: pt limited by R sided weakness and coordnation, impaired balance, decreased activity tolerance  Cognition: Cognition Overall Cognitive Status: Impaired/Different from baseline Orientation Level: Oriented X4 Cognition Arousal/Alertness: Awake/alert Behavior During Therapy: Flat affect Overall Cognitive Status: Impaired/Different from baseline Area of Impairment: Memory, Following commands, Safety/judgement, Awareness, Problem solving, Attention Current Attention Level: Sustained Memory: Decreased short-term memory, Decreased recall of precautions Following Commands: Follows one step commands consistently, Follows one step commands with increased time, Follows multi-step commands consistently Safety/Judgement: Decreased awareness of safety, Decreased awareness of deficits Awareness: Emergent Problem Solving: Slow processing, Decreased initiation, Difficulty sequencing, Requires verbal cues, Requires tactile cues General Comments: patient requires increased time to process and follow commands, decreased awareness to deficits but improving during session    Blood pressure (!) 172/95, pulse 61, temperature 97.7 F (36.5 C), temperature source Oral, resp. rate 18, height 5\' 8"  (1.727 m), weight 107.1 kg, SpO2 97 %. Physical Exam  Vitals reviewed. Constitutional: He is oriented to person, place, and time.  HENT:  Head:  Normocephalic and atraumatic.  Right Ear: External ear normal.  Left Ear: External ear normal.  Nose: Nose normal.  Eyes: Right eye exhibits no discharge. Left eye exhibits no discharge.  Respiratory: Effort normal. No stridor. No respiratory distress.  GI: Soft. Normal appearance. He exhibits no distension.  Musculoskeletal:     Cervical back: Normal range of motion.     Comments: No edema or tenderness in extremities  Neurological: He is alert and oriented to person, place, and time.  No ataxia bilateral lower extremities Motor: 4+-5/5 throughout, right side?  Slightly weaker  Skin: Skin is warm.  Psychiatric: His behavior is normal. Mood and judgment normal.    Results for orders placed or performed during the hospital encounter of 04/14/20 (from the past 24 hour(s))  Glucose, capillary     Status: Abnormal   Collection Time: 04/15/20  1:01 PM  Result Value Ref Range   Glucose-Capillary 149 (H) 70 - 99 mg/dL  Glucose, capillary     Status: Abnormal   Collection Time: 04/15/20  5:39 PM  Result Value Ref Range   Glucose-Capillary 148 (H) 70 - 99 mg/dL  Glucose, capillary     Status: Abnormal   Collection Time: 04/15/20  9:16 PM  Result Value Ref Range  Glucose-Capillary 177 (H) 70 - 99 mg/dL  Glucose, capillary     Status: Abnormal   Collection Time: 04/16/20  6:33 AM  Result Value Ref Range   Glucose-Capillary 129 (H) 70 - 99 mg/dL    CT ANGIO HEAD W OR WO CONTRAST  Result Date: 04/14/2020 CLINICAL DATA:  Right-sided sensory deficit EXAM: CT ANGIOGRAPHY HEAD AND NECK TECHNIQUE: Multidetector CT imaging of the head and neck was performed using the standard protocol during bolus administration of intravenous contrast. Multiplanar CT image reconstructions and MIPs were obtained to evaluate the vascular anatomy. Carotid stenosis measurements (when applicable) are obtained utilizing NASCET criteria, using the distal internal carotid diameter as the denominator. CONTRAST:  29mL  OMNIPAQUE IOHEXOL 350 MG/ML SOLN COMPARISON:  Head CT 04/14/2020 FINDINGS: CTA NECK FINDINGS SKELETON: There is no bony spinal canal stenosis. No lytic or blastic lesion. OTHER NECK: Normal pharynx, larynx and major salivary glands. No cervical lymphadenopathy. Unremarkable thyroid gland. UPPER CHEST: No pneumothorax or pleural effusion. No nodules or masses. AORTIC ARCH: There is mild calcific atherosclerosis of the aortic arch. There is no aneurysm, dissection or hemodynamically significant stenosis of the visualized portion of the aorta. Conventional 3 vessel aortic branching pattern. The visualized proximal subclavian arteries are widely patent. RIGHT CAROTID SYSTEM: No dissection, occlusion or aneurysm. Mild atherosclerotic calcification at the carotid bifurcation without hemodynamically significant stenosis. LEFT CAROTID SYSTEM: No dissection, occlusion or aneurysm. Mild atherosclerotic calcification at the carotid bifurcation without hemodynamically significant stenosis. VERTEBRAL ARTERIES: Codominant configuration. Both origins are clearly patent. There is no dissection, occlusion or flow-limiting stenosis to the skull base (V1-V3 segments). CTA HEAD FINDINGS POSTERIOR CIRCULATION: --Vertebral arteries: Calcific atherosclerosis. --Inferior cerebellar arteries: Normal. --Basilar artery: Normal. --Superior cerebellar arteries: Normal. --Posterior cerebral arteries (PCA): Normal. ANTERIOR CIRCULATION: --Intracranial internal carotid arteries: Normal. --Anterior cerebral arteries (ACA): Normal. Both A1 segments are present. Patent anterior communicating artery (a-comm). --Middle cerebral arteries (MCA): Normal. VENOUS SINUSES: As permitted by contrast timing, patent. ANATOMIC VARIANTS: None Review of the MIP images confirms the above findings. IMPRESSION: 1. No emergent large vessel occlusion or high-grade stenosis of the intracranial or cervical arteries. 2. Aortic Atherosclerosis (ICD10-I70.0). Electronically  Signed   By: Ulyses Jarred M.D.   On: 04/14/2020 21:54   CT ANGIO NECK W OR WO CONTRAST  Result Date: 04/14/2020 CLINICAL DATA:  Right-sided sensory deficit EXAM: CT ANGIOGRAPHY HEAD AND NECK TECHNIQUE: Multidetector CT imaging of the head and neck was performed using the standard protocol during bolus administration of intravenous contrast. Multiplanar CT image reconstructions and MIPs were obtained to evaluate the vascular anatomy. Carotid stenosis measurements (when applicable) are obtained utilizing NASCET criteria, using the distal internal carotid diameter as the denominator. CONTRAST:  48mL OMNIPAQUE IOHEXOL 350 MG/ML SOLN COMPARISON:  Head CT 04/14/2020 FINDINGS: CTA NECK FINDINGS SKELETON: There is no bony spinal canal stenosis. No lytic or blastic lesion. OTHER NECK: Normal pharynx, larynx and major salivary glands. No cervical lymphadenopathy. Unremarkable thyroid gland. UPPER CHEST: No pneumothorax or pleural effusion. No nodules or masses. AORTIC ARCH: There is mild calcific atherosclerosis of the aortic arch. There is no aneurysm, dissection or hemodynamically significant stenosis of the visualized portion of the aorta. Conventional 3 vessel aortic branching pattern. The visualized proximal subclavian arteries are widely patent. RIGHT CAROTID SYSTEM: No dissection, occlusion or aneurysm. Mild atherosclerotic calcification at the carotid bifurcation without hemodynamically significant stenosis. LEFT CAROTID SYSTEM: No dissection, occlusion or aneurysm. Mild atherosclerotic calcification at the carotid bifurcation without hemodynamically significant stenosis. VERTEBRAL ARTERIES: Codominant configuration.  Both origins are clearly patent. There is no dissection, occlusion or flow-limiting stenosis to the skull base (V1-V3 segments). CTA HEAD FINDINGS POSTERIOR CIRCULATION: --Vertebral arteries: Calcific atherosclerosis. --Inferior cerebellar arteries: Normal. --Basilar artery: Normal. --Superior  cerebellar arteries: Normal. --Posterior cerebral arteries (PCA): Normal. ANTERIOR CIRCULATION: --Intracranial internal carotid arteries: Normal. --Anterior cerebral arteries (ACA): Normal. Both A1 segments are present. Patent anterior communicating artery (a-comm). --Middle cerebral arteries (MCA): Normal. VENOUS SINUSES: As permitted by contrast timing, patent. ANATOMIC VARIANTS: None Review of the MIP images confirms the above findings. IMPRESSION: 1. No emergent large vessel occlusion or high-grade stenosis of the intracranial or cervical arteries. 2. Aortic Atherosclerosis (ICD10-I70.0). Electronically Signed   By: Ulyses Jarred M.D.   On: 04/14/2020 21:54   MR BRAIN WO CONTRAST  Result Date: 04/15/2020 CLINICAL DATA:  TIA. Right-sided numbness and tingling. History of stroke. EXAM: MRI HEAD WITHOUT CONTRAST TECHNIQUE: Multiplanar, multiecho pulse sequences of the brain and surrounding structures were obtained without intravenous contrast. COMPARISON:  CT head 04/14/2020 FINDINGS: Brain: Acute infarct left thalamus with patchy areas of restricted diffusion. No other acute infarct. Generalized atrophy. Chronic microvascular ischemic changes in the white matter. Chronic infarct left frontal lobe anteriorly. Small chronic infarcts in the cerebellum bilaterally. Chronic infarct right thalamus. Negative for hemorrhage or mass. No hydrocephalus. Vascular: Normal arterial flow voids. Skull and upper cervical spine: No focal skeletal lesion. Cervical degenerative changes. Sinuses/Orbits: Mucosal edema paranasal sinuses most notably in the left frontal sinus, left ethmoid and maxillary sinuses. No orbital lesion.  Bilateral cataract extraction. Other: None IMPRESSION: Acute infarct left thalamus Atrophy and chronic ischemic changes as above. Electronically Signed   By: Franchot Gallo M.D.   On: 04/15/2020 13:33   ECHOCARDIOGRAM COMPLETE  Result Date: 04/15/2020    ECHOCARDIOGRAM REPORT   Patient Name:   Jordan [name] Date of Exam: 04/15/2020 Medical Rec #:  [id number]       Height:       68.0 in Accession #:    [id number]      Weight:       236.1 lb Date of Birth:  06/13/1941       BSA:          2.193 m Patient Age:    23 years        BP:           112/63 mmHg Patient Gender: M               HR:           55 bpm. Exam Location:  Inpatient Procedure: 2D Echo, Cardiac Doppler and Color Doppler Indications:    TIA  History:        Patient has prior history of Echocardiogram examinations, most                 recent 02/17/2018. CAD, Pacemaker and Abnormal ECG, TIA and                 Stroke, Signs/Symptoms:Dyspnea; Risk Factors:Sleep Apnea,                 Hypertension, Dyslipidemia and Diabetes.  Sonographer:    Roseanna Rainbow RDCS Referring Phys: [id number] JENNIFER YATES  Sonographer Comments: Technically difficult study due to poor echo windows and patient is morbidly obese. Image acquisition challenging due to patient body habitus. IMPRESSIONS  1. Left ventricular ejection fraction, by estimation, is 65 to 70%. The left ventricle has normal function. The left ventricle has no  regional wall motion abnormalities. There is mild concentric left ventricular hypertrophy. Left ventricular diastolic parameters are consistent with Grade II diastolic dysfunction (pseudonormalization). Elevated left atrial pressure.  2. Right ventricular systolic function is normal. The right ventricular size is normal. There is mildly elevated pulmonary artery systolic pressure. The estimated right ventricular systolic pressure is 93.8 mmHg.  3. Left atrial size was mildly dilated.  4. The mitral valve is normal in structure. No evidence of mitral valve regurgitation. No evidence of mitral stenosis.  5. The aortic valve is normal in structure. Aortic valve regurgitation is mild. No aortic stenosis is present.  6. The inferior vena cava is dilated in size with <50% respiratory variability, suggesting right atrial pressure of 15 mmHg. FINDINGS  Left Ventricle:  Left ventricular ejection fraction, by estimation, is 65 to 70%. The left ventricle has normal function. The left ventricle has no regional wall motion abnormalities. The left ventricular internal cavity size was normal in size. There is  mild concentric left ventricular hypertrophy. Left ventricular diastolic parameters are consistent with Grade II diastolic dysfunction (pseudonormalization). Elevated left atrial pressure. Right Ventricle: The right ventricular size is normal. No increase in right ventricular wall thickness. Right ventricular systolic function is normal. There is mildly elevated pulmonary artery systolic pressure. The tricuspid regurgitant velocity is 1.98  m/s, and with an assumed right atrial pressure of 15 mmHg, the estimated right ventricular systolic pressure is 18.2 mmHg. Left Atrium: Left atrial size was mildly dilated. Right Atrium: Right atrial size was normal in size. Pericardium: There is no evidence of pericardial effusion. Mitral Valve: The mitral valve is normal in structure. Normal mobility of the mitral valve leaflets. Mild mitral annular calcification. No evidence of mitral valve regurgitation. No evidence of mitral valve stenosis. Tricuspid Valve: The tricuspid valve is normal in structure. Tricuspid valve regurgitation is not demonstrated. No evidence of tricuspid stenosis. Aortic Valve: The aortic valve is normal in structure. Aortic valve regurgitation is mild. No aortic stenosis is present. Pulmonic Valve: The pulmonic valve was normal in structure. Pulmonic valve regurgitation is not visualized. No evidence of pulmonic stenosis. Aorta: The aortic root is normal in size and structure. Venous: The inferior vena cava is dilated in size with less than 50% respiratory variability, suggesting right atrial pressure of 15 mmHg. IAS/Shunts: No atrial level shunt detected by color flow Doppler. Additional Comments: A pacer wire is visualized.  LEFT VENTRICLE PLAX 2D LVIDd:         4.80  cm     Diastology LVIDs:         3.30 cm     LV e' lateral:   7.40 cm/s LV PW:         1.20 cm     LV E/e' lateral: 17.5 LV IVS:        1.40 cm     LV e' medial:    5.00 cm/s LVOT diam:     2.00 cm     LV E/e' medial:  25.9 LV SV:         67 LV SV Index:   31 LVOT Area:     3.14 cm  LV Volumes (MOD) LV vol d, MOD A2C: 75.7 ml LV vol d, MOD A4C: 96.6 ml LV vol s, MOD A2C: 20.7 ml LV vol s, MOD A4C: 25.2 ml LV SV MOD A2C:     55.0 ml LV SV MOD A4C:     96.6 ml LV SV MOD BP:  62.2 ml RIGHT VENTRICLE            IVC RV S prime:     6.74 cm/s  IVC diam: 2.60 cm TAPSE (M-mode): 1.4 cm LEFT ATRIUM             Index       RIGHT ATRIUM           Index LA diam:        3.90 cm 1.78 cm/m  RA Area:     12.10 cm LA Vol (A2C):   24.8 ml 11.31 ml/m RA Volume:   21.20 ml  9.67 ml/m LA Vol (A4C):   39.8 ml 18.15 ml/m LA Biplane Vol: 32.0 ml 14.59 ml/m  AORTIC VALVE LVOT Vmax:   131.00 cm/s LVOT Vmean:  98.300 cm/s LVOT VTI:    0.213 m  AORTA Ao Root diam: 3.90 cm Ao Asc diam:  3.30 cm MITRAL VALVE                TRICUSPID VALVE MV Area (PHT): 3.72 cm     TR Peak grad:   15.7 mmHg MV Decel Time: 204 msec     TR Vmax:        198.00 cm/s MV E velocity: 129.33 cm/s MV A velocity: 106.00 cm/s  SHUNTS MV E/A ratio:  1.22         Systemic VTI:  0.21 m                             Systemic Diam: 2.00 cm Dani Gobble Croitoru MD Electronically signed by Sanda Klein MD Signature Date/Time: 04/15/2020/11:06:17 AM    Final    CT HEAD CODE STROKE WO CONTRAST  Result Date: 04/14/2020 CLINICAL DATA:  Code stroke.  Right arm weakness. EXAM: CT HEAD WITHOUT CONTRAST TECHNIQUE: Contiguous axial images were obtained from the base of the skull through the vertex without intravenous contrast. COMPARISON:  Head CT 02/16/2018 and MRI 02/17/2018 FINDINGS: Brain: There is no evidence of acute infarct, intracranial hemorrhage, mass, midline shift, or extra-axial fluid collection. Small chronic infarcts in the anterior left frontal lobe, medial right  occipital lobe, and cerebellum are unchanged. Hypodensities in the cerebral white matter bilaterally are unchanged and nonspecific but compatible with mild chronic small vessel ischemic disease. There is mild cerebral atrophy. Vascular: Calcified atherosclerosis at the skull base. No hyperdense vessel. Skull: No fracture or suspicious osseous lesion. Sinuses/Orbits: New complete opacification of the left frontal sinus and left anterior ethmoid air cells. Near complete opacification of the included portion of the left maxillary sinus with osseous sinus wall thickening. Clear mastoid air cells. Bilateral cataract extraction. Other: None. ASPECTS [facility] Stroke Program Early CT Score) - Ganglionic level infarction (caudate, lentiform nuclei, internal capsule, insula, M1-M3 cortex): 7 - Supraganglionic infarction (M4-M6 cortex): 3 Total score (0-10 with 10 being normal): 10 IMPRESSION: 1. No evidence of acute intracranial abnormality. 2. ASPECTS is 10. 3. 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 4. Chronic appearing left-sided sinusitis in an ostiomeatal unit obstruction pattern. These results were called by telephone at the time of interpretation on 04/14/2020 at 12:55 pm to Dr. Orlena Sheldon, who verbally acknowledged these results. Electronically Signed   By: Logan Bores M.D.   On: 04/14/2020 12:56    Assessment/Plan: Diagnosis: Left thalamic CVA. Stroke: Continue secondary stroke prophylaxis and Risk Factor Modification listed below:   Antiplatelet therapy:   Blood Pressure Management:  Continue  current medication with prn's with permisive HTN per primary team Statin Agent:   Diabetes management:   Labs independently reviewed.  Records reviewed and summated above.  1. Does the need for close, 24 hr/day medical supervision in concert with the patient's rehab needs make it unreasonable for this patient to be served in a less intensive setting? Yes  2. 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 Due to safety, disease management and patient education, does the patient require 24 hr/day rehab nursing? Yes 4. Does the patient require coordinated care of a physician, rehab nurse, therapy disciplines of PT/OT to address physical and functional deficits in the context of the above medical diagnosis(es)? Yes Addressing deficits in the following areas: balance, endurance, locomotion, strength, transferring, bathing, dressing, toileting and psychosocial support 5. Can the patient actively participate in an intensive therapy program of at least 3 hrs of therapy per day at least 5 days per week? Yes 6. The potential for patient to make measurable gains while on inpatient rehab is excellent 7. Anticipated functional outcomes upon discharge from inpatient rehab are modified independent and supervision  with PT, modified independent and supervision with OT, n/a with SLP. 8. Estimated rehab length of stay to reach the above functional goals is: 5-9 days. 9. Anticipated discharge destination: Home 10. Overall Rehab/Functional Prognosis: good  RECOMMENDATIONS: This patient's condition is appropriate for continued rehabilitative care in the following setting: CIR patient does not progress to supervision level of functioning. Patient has agreed to participate in recommended program. Yes Note that insurance prior authorization may be required for reimbursement for recommended care.  Comment: Rehab Admissions Coordinator to follow up.  I have personally performed a face to face diagnostic evaluation, including, but not limited to relevant  history and physical exam findings, of this patient and developed relevant assessment and plan.  Additionally, I have reviewed and concur with the physician assistant's documentation above.   Delice Lesch, MD, ABPMR Bary Leriche, PA-C 04/16/2020

## 2020-04-17 LAB — GLUCOSE, CAPILLARY
Glucose-Capillary: 138 mg/dL — ABNORMAL HIGH (ref 70–99)
Glucose-Capillary: 119 mg/dL — ABNORMAL HIGH (ref 70–99)
Glucose-Capillary: 180 mg/dL — ABNORMAL HIGH (ref 70–99)
Glucose-Capillary: 172 mg/dL — ABNORMAL HIGH (ref 70–99)
Glucose-Capillary: 166 mg/dL — ABNORMAL HIGH (ref 70–99)

## 2020-04-17 NOTE — Progress Notes (Signed)
Physical Therapy Treatment Patient Details Name: Jordan [name] MRN: [id number] DOB: January 08, 1941 Today's Date: 04/17/2020    History of Present Illness Jordan [name] is a 79 y.o. male with medical history significant of DM; CVA: OSA on CPAP; narcolepsy; pacemaker; HTN; HLD; and CAD presenting with R-sided numbness/tingling. CT negative, MRI pending.     PT Comments    Patient seen for mobility progression. Pt requires min-mod A for gait training with SPC. One overt LOB requiring assistance to recover and prevent fall. Continue to recommend CIR for further skilled PT services to maximize independence and safety with mobility.   Follow Up Recommendations  CIR     Equipment Recommendations  Other (comment) (TBD)    Recommendations for Other Services Rehab consult     Precautions / Restrictions Precautions Precautions: Fall Precaution Comments: R sided inattention  Restrictions Weight Bearing Restrictions: No    Mobility  Bed Mobility Overal bed mobility: Modified Independent Bed Mobility: Supine to Sit           General bed mobility comments: HOB elevated and use of rail   Transfers Overall transfer level: Needs assistance Equipment used: None Transfers: Sit to/from Stand Sit to Stand: Min assist         General transfer comment: assist to steady upon standing; pt bracing bilat LE against EOB   Ambulation/Gait Ambulation/Gait assistance: Min assist;Mod assist Gait Distance (Feet): 100 Feet Assistive device: Straight cane Gait Pattern/deviations: Decreased stride length;Step-through pattern;Decreased step length - right;Drifts right/left Gait velocity: decr   General Gait Details: tendency to drift to R side and cues required to stay in the middle of hallway to avoid running into objects; cues for 2 point sequencing with SPC and assistance required for balance; increased assist needed when turning and pt with one overt LOB requiring assistance to recover     Stairs             Wheelchair Mobility    Modified Rankin (Stroke Patients Only) Modified Rankin (Stroke Patients Only) Pre-Morbid Rankin Score: No symptoms Modified Rankin: Moderately severe disability     Balance Overall balance assessment: Needs assistance Sitting-balance support: Feet supported Sitting balance-Leahy Scale: Fair     Standing balance support: Single extremity supported;During functional activity;No upper extremity supported Standing balance-Leahy Scale: Poor Standing balance comment: relies on external support                            Cognition Arousal/Alertness: Awake/alert Behavior During Therapy: WFL for tasks assessed/performed;Impulsive Overall Cognitive Status: Within Functional Limits for tasks assessed Area of Impairment: Safety/judgement                         Safety/Judgement: Decreased awareness of safety;Decreased awareness of deficits     General Comments: a little impulsive and decreased safety awareness but otherwise [facility] for mobility tasks      Exercises      General Comments        Pertinent Vitals/Pain Pain Assessment: No/denies pain    Home Living                      Prior Function            PT Goals (current goals can now be found in the care plan section) Progress towards PT goals: Progressing toward goals    Frequency    Min 4X/week  PT Plan Current plan remains appropriate    Co-evaluation              AM-PAC PT "6 Clicks" Mobility   Outcome Measure  Help needed turning from your back to your side while in a flat bed without using bedrails?: A Little Help needed moving from lying on your back to sitting on the side of a flat bed without using bedrails?: A Little Help needed moving to and from a bed to a chair (including a wheelchair)?: A Little Help needed standing up from a chair using your arms (e.g., wheelchair or bedside chair)?: A Little Help  needed to walk in hospital room?: A Lot Help needed climbing 3-5 steps with a railing? : A Lot 6 Click Score: 16    End of Session Equipment Utilized During Treatment: Gait belt Activity Tolerance: Patient tolerated treatment well Patient left: with call bell/phone within reach;in chair;with chair alarm set Nurse Communication: Mobility status PT Visit Diagnosis: Unsteadiness on feet (R26.81);Muscle weakness (generalized) (M62.81);Other abnormalities of gait and mobility (R26.89);Difficulty in walking, not elsewhere classified (R26.2);Hemiplegia and hemiparesis Hemiplegia - Right/Left: Right Hemiplegia - dominant/non-dominant: Dominant Hemiplegia - caused by: Unspecified;Cerebral infarction     Time: 1610-9604 PT Time Calculation (min) (ACUTE ONLY): 31 min  Charges:  $Gait Training: 23-37 mins                     Jordan [name], PTA Acute Rehabilitation Services Pager: [phone] Office: [phone]     Jordan [name] 04/17/2020, 5:26 PM

## 2020-04-17 NOTE — Progress Notes (Signed)
PROGRESS NOTE  Jordan [name] BVQ:[id number] DOB: 1941/08/05   PCP: [name], MD  Patient is from: Home.  DOA: 04/14/2020 LOS: 2  Brief Narrative / Interim history: 79 year old male with history of DM-2, CVA, OSA on CPAP, narcolepsy, SSS/PPM and morbid obesity presenting with right-sided numbness and tingling more in the arm than legs and admitted for CVA work-up.  On arrival to ED, hemodynamically stable although his blood pressure was elevated later on.  Labs including CMP and CBC with differential without significant finding other than elevated glucose.  UA, COVID-19 PCR, UDS, CT head without contrast and CTA head and neck without acute finding.  Neurology consulted and admitted for CVA work-up.   The next day, MRI brain revealed acute left thalamic stroke.  Echo with EF of 65 to 70%, G2 DD and RVSP of 31 mmHg but no PFO or thrombus.  A1c 7.9%.  LDL 57.  TSH within normal.  Evaluated by therapy who recommended CIR. Approved for CIR.  Waiting on bed.  Subjective: Seen and examined earlier this morning.  No major events overnight of this morning.  No complaints.  Reports improvement in his right arm numbness.  No other focal neuro symptoms.  Denies chest pain, dyspnea, GI or UTI symptoms.  Objective: Vitals:   04/16/20 2322 04/17/20 0342 04/17/20 0831 04/17/20 1100  BP: (!) 178/86 (!) 164/90 132/83 (!) 153/85  Pulse: 63 62 63 66  Resp: 18 18 18 18   Temp: 97.9 F (36.6 C) 98 F (36.7 C) 98 F (36.7 C) 98.4 F (36.9 C)  TempSrc: Oral Oral Oral Oral  SpO2: 97% 98% 97% 98%  Weight:      Height:        Intake/Output Summary (Last 24 hours) at 04/17/2020 1405 Last data filed at 04/17/2020 1233 Gross per 24 hour  Intake --  Output 1700 ml  Net -1700 ml   Filed Weights   04/14/20 1200 04/14/20 1302 04/15/20 0621  Weight: 107.3 kg 107.3 kg 107.1 kg    Examination:  GENERAL: No apparent distress.  Nontoxic. HEENT: MMM.  Vision and hearing grossly intact.  NECK:  Supple.  No apparent JVD.  RESP:  No IWOB.  Fair aeration bilaterally. CVS:  RRR. Heart sounds normal.  ABD/GI/GU: BS+. Abd soft, NTND.  MSK/EXT:  Moves extremities. No apparent deformity. No edema.  SKIN: no apparent skin lesion or wound NEURO: Awake, alert and oriented appropriately.  No apparent focal neuro deficit. PSYCH: Calm. Normal affect.   Procedures:  None  Microbiology summarized: COVID-19 PCR negative.  Assessment & Plan: Right-sided paresthesia/acute left thalamic stroke-noted on MRI brain.  CTA head and neck without acute finding.  Echo with EF of 65 to 70%, G2 DD and RVSP of 31 mmHg but no PFO or thrombus.  A1c 7.9%.  LDL 57.  TSH within normal.  UDS negative.  No arrhythmia on PPM interrogation. -Neurology following and recommended DAPT Plavix and low aspirin for 3 weeks followed by Plavix alone. -Optimize risk factors -Permissive hypertension up to 180/105 for 2 to 3 days-will start normalizing -Continue statin.  Uncontrolled DM-2 with hyperglycemia: A1c 7.9%. 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  Appears euvolemic.  Good urine output without diuretics. -Discontinue IV fluid and monitor fluid status -May benefit from as needed diuretics down the road. -  Optimize BP control.  Essential hypertension: BP slightly elevated. -Discontinue IV fluid  OSA on CPAP-compliant. -Continue CPAP  Narcolepsy -Continue home medications  SSS s/p PPM-reportedly no events on interrogation.   Morbid obesity: Body mass index is 35.9 kg/m.  With diabetes. -Encourage lifestyle change to lose weight.          DVT prophylaxis:  enoxaparin (LOVENOX) injection 40 mg Start: 04/14/20 1800  Code Status: Full code Family Communication: Updated patient's wife over the phone from  patient's phone on 6/15. Status is: Inpatient  Remains inpatient appropriate because:Unsafe d/c plan   Dispo: The patient is from: Home              Anticipated d/c is to: CIR              Anticipated d/c date is: 1 day              Patient currently is medically stable to d/c.       Consultants:  Neurology-signed off   Sch Meds:  Scheduled Meds: . aspirin EC  81 mg Oral Daily  . atorvastatin  40 mg Oral QAC breakfast  . clopidogrel  75 mg Oral Daily  . enoxaparin (LOVENOX) injection  40 mg Subcutaneous Q24H  . insulin aspart  0-15 Units Subcutaneous TID WC  . insulin aspart  0-5 Units Subcutaneous QHS  . insulin glargine  5 Units Subcutaneous BID   Continuous Infusions: . sodium chloride 50 mL/hr at 04/16/20 1646   PRN Meds:.acetaminophen **OR** acetaminophen (TYLENOL) oral liquid 160 mg/5 mL **OR** acetaminophen, senna-docusate  Antimicrobials: Anti-infectives (From admission, onward)   None       I have personally reviewed the following labs and images: CBC: Recent Labs  Lab 04/14/20 1238 04/14/20 1243  WBC 8.4  --   NEUTROABS 4.7  --   HGB 13.2 12.9*  HCT 40.2 38.0*  MCV 88.7  --   PLT 282  --    BMP &GFR Recent Labs  Lab 04/14/20 1238 04/14/20 1243  NA 139 141  K 4.0 3.9  CL 108 107  CO2 21*  --   GLUCOSE 183* 181*  BUN 12 11  CREATININE 1.01 0.90  CALCIUM 9.4  --    Estimated Creatinine Clearance: 77.7 mL/min (by C-G formula based on SCr of 0.9 mg/dL). Liver & Pancreas: Recent Labs  Lab 04/14/20 1238  AST 33  ALT 23  ALKPHOS 59  BILITOT 0.8  PROT 6.6  ALBUMIN 3.5   No results for input(s): LIPASE, AMYLASE in the last 168 hours. No results for input(s): AMMONIA in the last 168 hours. Diabetic: No results for input(s): HGBA1C in the last 72 hours. Recent Labs  Lab 04/16/20 1626 04/16/20 2110 04/17/20 0607 04/17/20 0829 04/17/20 1136  GLUCAP 172* 178* 138* 119* 180*   Cardiac Enzymes: No results for input(s): CKTOTAL,  CKMB, CKMBINDEX, TROPONINI in the last 168 hours. No results for input(s): PROBNP in the last 8760 hours. Coagulation Profile: Recent Labs  Lab 04/14/20 1238  INR 1.1   Thyroid Function Tests: Recent Labs    04/14/20 1700  TSH 0.976   Lipid Profile: Recent Labs    04/15/20 0724  CHOL 115  HDL 32*  LDLCALC 57  TRIG 130  CHOLHDL 3.6   Anemia Panel: No results for input(s): VITAMINB12, FOLATE, FERRITIN, TIBC, IRON, RETICCTPCT in the last 72 hours. Urine analysis:    Component Value Date/Time   COLORURINE YELLOW 04/14/2020 1700   APPEARANCEUR CLEAR 04/14/2020  1700   LABSPEC 1.014 04/14/2020 1700   PHURINE 5.0 04/14/2020 1700   GLUCOSEU NEGATIVE 04/14/2020 1700   HGBUR NEGATIVE 04/14/2020 1700   BILIRUBINUR NEGATIVE 04/14/2020 1700   KETONESUR NEGATIVE 04/14/2020 1700   PROTEINUR NEGATIVE 04/14/2020 1700   NITRITE NEGATIVE 04/14/2020 1700   LEUKOCYTESUR NEGATIVE 04/14/2020 1700   Sepsis Labs: Invalid input(s): PROCALCITONIN, [place]  Microbiology: Recent Results (from the past 240 hour(s))  SARS Coronavirus 2 by RT PCR (hospital order, performed in [facility] hospital lab) Nasopharyngeal Nasopharyngeal Swab     Status: None   Collection Time: 04/14/20  3:33 PM   Specimen: Nasopharyngeal Swab  Result Value Ref Range Status   SARS Coronavirus 2 NEGATIVE NEGATIVE Final    Comment: (NOTE) SARS-CoV-2 target nucleic acids are NOT DETECTED.  The SARS-CoV-2 RNA is generally detectable in upper and lower respiratory specimens during the acute phase of infection. The lowest concentration of SARS-CoV-2 viral copies this assay can detect is 250 copies / mL. A negative result does not preclude SARS-CoV-2 infection and should not be used as the sole basis for treatment or other patient management decisions.  A negative result may occur with improper specimen collection / handling, submission of specimen other than nasopharyngeal swab, presence of viral mutation(s)  within the areas targeted by this assay, and inadequate number of viral copies (<250 copies / mL). A negative result must be combined with clinical observations, patient history, and epidemiological information.  Fact Sheet for Patients:   StrictlyIdeas.no  Fact Sheet for Healthcare Providers: BankingDealers.co.za  This test is not yet approved or  cleared by the [place] FDA and has been authorized for detection and/or diagnosis of SARS-CoV-2 by FDA under an Emergency Use Authorization (EUA).  This EUA will remain in effect (meaning this test can be used) for the duration of the COVID-19 declaration under Section 564(b)(1) of the Act, 21 U.S.C. section 360bbb-3(b)(1), unless the authorization is terminated or revoked sooner.  Performed at [facility], [place] [address], [place], [place] [address]     Radiology Studies: No results found.   Donnamaria Shands T. [place]  If 7PM-7AM, please contact night-coverage [email or web address] Password [facility] 04/17/2020, 2:05 PM

## 2020-04-17 NOTE — Evaluation (Signed)
Speech Language Pathology Evaluation Patient Details Name: Jordan [name] MRN: [id number] DOB: 09-Nov-1940 Today's Date: 04/17/2020 Time: 8366-2947 SLP Time Calculation (min) (ACUTE ONLY): 30 min  Problem List:  Patient Active Problem List   Diagnosis Date Noted  . Morbid obesity ([place])   . Acute blood loss anemia   . Acute thalamic infarction ([place]) 04/15/2020  . 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 TIA (transient ischemic attack) 02/17/2018  . DM2 (diabetes mellitus, type 2) ([place]) 02/17/2018  . Insomnia 02/01/2018  . 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 Dyspnea on exertion 03/03/2014  . HLD (hyperlipidemia) 12/13/2007  . Essential hypertension 12/13/2007  . OBESITY 12/05/2007  . Obstructive sleep apnea 12/05/2007  . 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 Coronary atherosclerosis 12/05/2007   Past Medical History:  Past Medical History:  Diagnosis Date  . Arthritis    R shoulder, bone spur  . Arthritis    "hands" (03/25/2016)  . Chronic lower back pain   . Coronary heart disease    Dr Pernell Dupre  . H/O cardiovascular stress test    perhaps last one was 2009  . Heart murmur    12/29/11 echo: mild MR, no AS, trivial TR  . History of gout   . Hyperlipidemia   . Hypertension    saw Dr. Linard Millers last earl;y- 2014, cardiac cath. last done ?2009, blocks seen didn't require any intervention at that point.  . 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 OSA on CPAP    NPSG 12-13-98 AHI 22.7  . PONV (postoperative nausea and vomiting)   . Presence of permanent cardiac pacemaker   . Shortness of breath    with exertion   . Stroke ([place])   . Type II diabetes mellitus ([place])    Past Surgical History:  Past Surgical History:  Procedure Laterality Date  . CARDIAC CATHETERIZATION  2009?  . EP IMPLANTABLE DEVICE N/A 03/25/2016   Procedure: Pacemaker Implant - Dual  Chamber;  Surgeon: Evans Lance, MD;  Location: [facility];  Service: Cardiovascular;  Laterality: N/A;  . FRACTURE SURGERY    . INSERT / REPLACE / REMOVE PACEMAKER  03/24/2016  . LEFT AND RIGHT HEART CATHETERIZATION WITH CORONARY ANGIOGRAM N/A 06/05/2014   Procedure: LEFT AND RIGHT HEART CATHETERIZATION WITH CORONARY ANGIOGRAM;  Surgeon: Sinclair Grooms, MD;  Location: [facility];  Service: Cardiovascular;  Laterality: N/A;  . NASAL FRACTURE SURGERY    . SHOULDER ARTHROSCOPY WITH ROTATOR CUFF REPAIR AND SUBACROMIAL DECOMPRESSION Right 08/16/2013   Procedure: SHOULDER ARTHROSCOPY WITH ROTATOR CUFF REPAIR AND SUBACROMIAL DECOMPRESSION;  Surgeon: [name], MD;  Location: [facility];  Service: Orthopedics;  Laterality: Right;  Right shoulder arthroscopy rotator cuff repair, subacromial decompression.  . TONSILLECTOMY     HPI:  79 year old male with history of DM-2, CVA, OSA on CPAP, narcolepsy, SSS/PPM and morbid obesity presenting with right-sided numbness and tingling more in the arm than legs and admitted for CVA work-up.  MRI 6/15 revealed L thalamic CVA   Assessment / Plan / Recommendation Clinical Impression  Pt presents with functional cognitive linguistic ability for tasks assessed today.  Pt was assessed using COGNISTAT (see below for additional information).  Pt performed within average range on all subtests.  Only points missed were on delayed word recall task.  Pt exhibited excellent memory and was able to report weekly schedule, recalls wife's MD  appointments.  Pt has autistic son and meets with psych every other week for management of his condition.  Pt reports he does not always recall day of week but is able to know day of week with context cues from his schedule and/or television programming.  Pt reports some preexisting word retrieval difficulty which has not changed with this admission, and is able to recall words with additional time.  COGNISTAT - all subtests  within average range except where otherwise specified Orientation 12/12 Attention 8/8 Comprehension 6/6 Repetition 12/12 Naming 8/8 Construction not assessed Memory 9/12 Calculations 4/4 Similarities 8/8 Judgment 6/6    SLP Assessment  SLP Recommendation/Assessment: Patient does not need any further Speech Lanaguage Pathology Services SLP Visit Diagnosis: Cognitive communication deficit (R41.841)    Follow Up Recommendations  None    Frequency and Duration           SLP Evaluation Cognition  Overall Cognitive Status: Within Functional Limits for tasks assessed Arousal/Alertness: Awake/alert Orientation Level: Oriented X4 Attention: Focused Focused Attention: Appears intact Memory: Appears intact Awareness: Appears intact Problem Solving: Appears intact Executive Function: Reasoning Reasoning: Appears intact Safety/Judgment: Appears intact       Comprehension  Auditory Comprehension Overall Auditory Comprehension: Appears within functional limits for tasks assessed Commands: Within Functional Limits Conversation: Complex    Expression Expression Primary Mode of Expression: Verbal Verbal Expression Overall Verbal Expression: Appears within functional limits for tasks assessed Repetition: No impairment Naming: No impairment   Oral / Motor  Motor Speech Overall Motor Speech: Appears within functional limits for tasks assessed Respiration: Within functional limits Phonation: Normal Resonance: Within functional limits Articulation: Within functional limitis Intelligibility: Intelligible Motor Planning: Witnin functional limits Motor Speech Errors: Not applicable   [place], [place], [place] Office: [phone]  04/17/2020, 1:04 PM

## 2020-04-17 NOTE — Progress Notes (Signed)
Inpatient Rehabilitation Admissions Coordinator  Insurance has approved CIR, but no bed available today. I met with patient and he is aware. I will follow up tomorrow for bed availability.  Danne Baxter, RN, MSN Rehab Admissions Coordinator [phone] 04/17/2020 12:32 PM

## 2020-04-18 ENCOUNTER — Encounter (HOSPITAL_COMMUNITY): Payer: Self-pay | Admitting: Physical Medicine & Rehabilitation

## 2020-04-18 ENCOUNTER — Other Ambulatory Visit: Payer: Self-pay

## 2020-04-18 ENCOUNTER — Inpatient Hospital Stay (HOSPITAL_COMMUNITY)
Admission: RE | Admit: 2020-04-18 | Discharge: 2020-04-29 | DRG: 057 | Disposition: A | Discharge status: Home Health | Payer: Medicare HMO | Source: Intra-hospital | Attending: Physical Medicine & Rehabilitation | Admitting: Physical Medicine & Rehabilitation

## 2020-04-18 DIAGNOSIS — N179 Acute kidney failure, unspecified: Secondary | ICD-10-CM | POA: Diagnosis not present

## 2020-04-18 DIAGNOSIS — D62 Acute posthemorrhagic anemia: Secondary | ICD-10-CM | POA: Diagnosis present

## 2020-04-18 DIAGNOSIS — Z7984 Long term (current) use of oral hypoglycemic drugs: Secondary | ICD-10-CM | POA: Diagnosis not present

## 2020-04-18 DIAGNOSIS — R7309 Other abnormal glucose: Secondary | ICD-10-CM | POA: Diagnosis not present

## 2020-04-18 DIAGNOSIS — E46 Unspecified protein-calorie malnutrition: Secondary | ICD-10-CM | POA: Diagnosis not present

## 2020-04-18 DIAGNOSIS — L989 Disorder of the skin and subcutaneous tissue, unspecified: Secondary | ICD-10-CM

## 2020-04-18 DIAGNOSIS — E8809 Other disorders of plasma-protein metabolism, not elsewhere classified: Secondary | ICD-10-CM

## 2020-04-18 DIAGNOSIS — Z9989 Dependence on other enabling machines and devices: Secondary | ICD-10-CM

## 2020-04-18 DIAGNOSIS — Z7902 Long term (current) use of antithrombotics/antiplatelets: Secondary | ICD-10-CM | POA: Diagnosis not present

## 2020-04-18 DIAGNOSIS — I10 Essential (primary) hypertension: Secondary | ICD-10-CM | POA: Diagnosis not present

## 2020-04-18 DIAGNOSIS — I2510 Atherosclerotic heart disease of native coronary artery without angina pectoris: Secondary | ICD-10-CM | POA: Diagnosis present

## 2020-04-18 DIAGNOSIS — R05 Cough: Secondary | ICD-10-CM | POA: Diagnosis not present

## 2020-04-18 DIAGNOSIS — Z87891 Personal history of nicotine dependence: Secondary | ICD-10-CM

## 2020-04-18 DIAGNOSIS — E785 Hyperlipidemia, unspecified: Secondary | ICD-10-CM | POA: Diagnosis present

## 2020-04-18 DIAGNOSIS — G4733 Obstructive sleep apnea (adult) (pediatric): Secondary | ICD-10-CM | POA: Diagnosis not present

## 2020-04-18 DIAGNOSIS — I639 Cerebral infarction, unspecified: Secondary | ICD-10-CM | POA: Diagnosis not present

## 2020-04-18 DIAGNOSIS — E1165 Type 2 diabetes mellitus with hyperglycemia: Secondary | ICD-10-CM

## 2020-04-18 DIAGNOSIS — G47419 Narcolepsy without cataplexy: Secondary | ICD-10-CM | POA: Diagnosis present

## 2020-04-18 DIAGNOSIS — I6381 Other cerebral infarction due to occlusion or stenosis of small artery: Secondary | ICD-10-CM

## 2020-04-18 DIAGNOSIS — E1151 Type 2 diabetes mellitus with diabetic peripheral angiopathy without gangrene: Secondary | ICD-10-CM | POA: Diagnosis present

## 2020-04-18 DIAGNOSIS — Z79899 Other long term (current) drug therapy: Secondary | ICD-10-CM

## 2020-04-18 DIAGNOSIS — Z950 Presence of cardiac pacemaker: Secondary | ICD-10-CM

## 2020-04-18 DIAGNOSIS — I69351 Hemiplegia and hemiparesis following cerebral infarction affecting right dominant side: Principal | ICD-10-CM

## 2020-04-18 DIAGNOSIS — R059 Cough, unspecified: Secondary | ICD-10-CM

## 2020-04-18 LAB — GLUCOSE, CAPILLARY
Glucose-Capillary: 129 mg/dL — ABNORMAL HIGH (ref 70–99)
Glucose-Capillary: 184 mg/dL — ABNORMAL HIGH (ref 70–99)
Glucose-Capillary: 176 mg/dL — ABNORMAL HIGH (ref 70–99)
Glucose-Capillary: 164 mg/dL — ABNORMAL HIGH (ref 70–99)

## 2020-04-18 MED ORDER — ACETAMINOPHEN 325 MG PO TABS: 325.0000 mg | ORAL_TABLET | ORAL | Status: DC | PRN

## 2020-04-18 MED ORDER — GUAIFENESIN-DM 100-10 MG/5ML PO SYRP: 5.0000 mL | ORAL_SOLUTION | Freq: Four times a day (QID) | ORAL | Status: DC | PRN

## 2020-04-18 MED ORDER — ATORVASTATIN CALCIUM 40 MG PO TABS
40.0000 mg | ORAL_TABLET | Freq: Every day | ORAL | Status: DC
  Administered 2020-04-19 – 2020-04-29 (×11): 40 mg via ORAL
  Filled 2020-04-18 (×12): qty 1

## 2020-04-18 MED ORDER — INSULIN ASPART 100 UNIT/ML ~~LOC~~ SOLN
0.0000 [IU] | Freq: Every day | SUBCUTANEOUS | Status: DC
  Administered 2020-04-25: 3 [IU] via SUBCUTANEOUS

## 2020-04-18 MED ORDER — METFORMIN HCL ER 750 MG PO TB24
2000.0000 mg | ORAL_TABLET | Freq: Every day | ORAL | Status: DC
  Administered 2020-04-19 – 2020-04-29 (×11): 2000 mg via ORAL
  Filled 2020-04-18 (×11): qty 1

## 2020-04-18 MED ORDER — ALUM & MAG HYDROXIDE-SIMETH 200-200-20 MG/5ML PO SUSP: 30.0000 mL | ORAL | Status: DC | PRN

## 2020-04-18 MED ORDER — ASPIRIN 81 MG PO TBEC: 81.0000 mg | DELAYED_RELEASE_TABLET | Freq: Every day | ORAL | 0 refills | Status: DC

## 2020-04-18 MED ORDER — PROCHLORPERAZINE EDISYLATE 10 MG/2ML IJ SOLN: 5.0000 mg | Freq: Four times a day (QID) | INTRAMUSCULAR | Status: DC | PRN

## 2020-04-18 MED ORDER — DIPHENHYDRAMINE HCL 12.5 MG/5ML PO ELIX: 12.5000 mg | ORAL_SOLUTION | Freq: Four times a day (QID) | ORAL | Status: DC | PRN

## 2020-04-18 MED ORDER — PROCHLORPERAZINE MALEATE 5 MG PO TABS: 5.0000 mg | ORAL_TABLET | Freq: Four times a day (QID) | ORAL | Status: DC | PRN

## 2020-04-18 MED ORDER — BISACODYL 10 MG RE SUPP: 10.0000 mg | Freq: Every day | RECTAL | Status: DC | PRN

## 2020-04-18 MED ORDER — FLEET ENEMA 7-19 GM/118ML RE ENEM: 1.0000 | ENEMA | Freq: Once | RECTAL | Status: DC | PRN

## 2020-04-18 MED ORDER — POLYETHYLENE GLYCOL 3350 17 G PO PACK
17.0000 g | PACK | Freq: Every day | ORAL | Status: DC | PRN
  Administered 2020-04-23: 17 g via ORAL
  Filled 2020-04-18: qty 1

## 2020-04-18 MED ORDER — AMPHETAMINE-DEXTROAMPHETAMINE 10 MG PO TABS: 10.0000 mg | ORAL_TABLET | Freq: Every day | ORAL | Status: DC | PRN

## 2020-04-18 MED ORDER — PROCHLORPERAZINE 25 MG RE SUPP: 12.5000 mg | Freq: Four times a day (QID) | RECTAL | Status: DC | PRN

## 2020-04-18 MED ORDER — ASPIRIN EC 81 MG PO TBEC
81.0000 mg | DELAYED_RELEASE_TABLET | Freq: Every day | ORAL | Status: DC
  Administered 2020-04-19 – 2020-04-29 (×11): 81 mg via ORAL
  Filled 2020-04-18 (×11): qty 1

## 2020-04-18 MED ORDER — ENOXAPARIN SODIUM 40 MG/0.4ML ~~LOC~~ SOLN
40.0000 mg | SUBCUTANEOUS | Status: DC
  Administered 2020-04-18 – 2020-04-19 (×2): 40 mg via SUBCUTANEOUS
  Filled 2020-04-18 (×2): qty 0.4

## 2020-04-18 MED ORDER — FUROSEMIDE 40 MG PO TABS
40.0000 mg | ORAL_TABLET | Freq: Every day | ORAL | 0 refills | Status: DC | PRN
Start: 2020-04-18 — End: 2020-04-29

## 2020-04-18 MED ORDER — CLOPIDOGREL BISULFATE 75 MG PO TABS
75.0000 mg | ORAL_TABLET | Freq: Every day | ORAL | Status: DC
  Administered 2020-04-19 – 2020-04-29 (×11): 75 mg via ORAL
  Filled 2020-04-18 (×11): qty 1

## 2020-04-18 MED ORDER — TRAZODONE HCL 50 MG PO TABS: 25.0000 mg | ORAL_TABLET | Freq: Every evening | ORAL | Status: DC | PRN

## 2020-04-18 MED ORDER — INSULIN ASPART 100 UNIT/ML ~~LOC~~ SOLN
0.0000 [IU] | Freq: Three times a day (TID) | SUBCUTANEOUS | Status: DC
  Administered 2020-04-18: 3 [IU] via SUBCUTANEOUS
  Administered 2020-04-19 – 2020-04-23 (×9): 2 [IU] via SUBCUTANEOUS
  Administered 2020-04-23 – 2020-04-24 (×2): 3 [IU] via SUBCUTANEOUS
  Administered 2020-04-24: 0 [IU] via SUBCUTANEOUS
  Administered 2020-04-24: 2 [IU] via SUBCUTANEOUS
  Administered 2020-04-25 – 2020-04-26 (×2): 3 [IU] via SUBCUTANEOUS
  Administered 2020-04-26: 2 [IU] via SUBCUTANEOUS
  Administered 2020-04-26: 3 [IU] via SUBCUTANEOUS
  Administered 2020-04-27 – 2020-04-28 (×3): 2 [IU] via SUBCUTANEOUS
  Administered 2020-04-29: 3 [IU] via SUBCUTANEOUS

## 2020-04-18 NOTE — Progress Notes (Signed)
Patient arrived and settled into unit. Unit routine explained. Patient Verbalized agreement with safety protocols. No pain or other needs at this time. Patient wants to discuss his prior exercise plans with therapists and MD during stay.

## 2020-04-18 NOTE — H&P (Signed)
Physical Medicine and Rehabilitation Admission H&P    Chief Complaint  Patient presents with  . Stroke with functional deficits.       HPI:  Jordan [name] is an 79 year old male RH male with history of CAD, T2DM, OSA- on CPAP, SSS s/p PPM; who was admitted on 04/14/2020 with right-sided numbness and tingling.  History taken from chart review and patient.  BP noted to be elevated at admission.  CT head was unremarkable for acute intracranial process.  CTA head/neck was negative for LVO high-grade stenosis.  Echocardiogram with EF of 65-70%.  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  Dr. Erlinda [name] felt the stroke was secondary to small vessel disease and aspirin was added to Plavix with recommendations to stop aspirin after 3 weeks.  Blood pressure is being monitored with permissive hypertension.  Lantus added for management of blood sugars.  Hospital course further complicated by mild drop in H&H.  Therapy has been ongoing he continues to have deficits in balance and safety affecting ADLs and mobility.  CIR was recommended for follow-up therapy.  Please see preadmission assessment mother today as well.   Review of Systems  Constitutional: Negative for chills and fever.  HENT: Negative for hearing loss and tinnitus.   Eyes: Negative for blurred vision and double vision.  Respiratory: Positive for cough (occasionally). Negative for shortness of breath.   Cardiovascular: Negative for chest pain, claudication and leg swelling.  Gastrointestinal: Negative for abdominal pain, constipation, heartburn and nausea.  Genitourinary: Negative for dysuria and frequency.  Musculoskeletal: Negative for back pain and myalgias.  Skin: Negative for itching and rash.  Neurological: Positive for sensory change, focal weakness and headaches (had one today--resolved without meds). Negative for dizziness.  Psychiatric/Behavioral: The patient is not nervous/anxious and  does not have insomnia.   All other systems reviewed and are negative.    Past Medical History:  Diagnosis Date  . Arthritis    R shoulder, bone spur  . Arthritis    "hands" (03/25/2016)  . Chronic lower back pain   . Coronary heart disease    Dr Jordan [name]  . H/O cardiovascular stress test    perhaps last one was 2009  . Heart murmur    12/29/11 echo: mild MR, no AS, trivial TR  . History of gout   . Hyperlipidemia   . Hypertension    saw Dr. Linard [name] last earl;y- 2014, cardiac cath. last done ?2009, blocks seen didn't require any intervention at that point.  . Narcolepsy    [name] 04-11-97; Mean Latency 1.13min, SOREM 2  . OSA on CPAP    NPSG 12-13-98 AHI 22.7  . PONV (postoperative nausea and vomiting)   . Presence of permanent cardiac pacemaker   . Shortness of breath    with exertion   . Stroke ([place])   . Type II diabetes mellitus ([place])     Past Surgical History:  Procedure Laterality Date  . CARDIAC CATHETERIZATION  2009?  . EP IMPLANTABLE DEVICE N/A 03/25/2016   Procedure: Pacemaker Implant - Dual Chamber;  Surgeon: Jordan Lance, MD;  Location: [facility];  Service: Cardiovascular;  Laterality: N/A;  . FRACTURE SURGERY    . INSERT / REPLACE / REMOVE PACEMAKER  03/24/2016  . LEFT AND RIGHT HEART CATHETERIZATION WITH CORONARY ANGIOGRAM N/A 06/05/2014   Procedure: LEFT AND RIGHT HEART CATHETERIZATION WITH CORONARY ANGIOGRAM;  Surgeon: Jordan Crome III,  MD;  Location: [facility];  Service: Cardiovascular;  Laterality: N/A;  . NASAL FRACTURE SURGERY    . SHOULDER ARTHROSCOPY WITH ROTATOR CUFF REPAIR AND SUBACROMIAL DECOMPRESSION Right 08/16/2013   Procedure: SHOULDER ARTHROSCOPY WITH ROTATOR CUFF REPAIR AND SUBACROMIAL DECOMPRESSION;  Surgeon: Jordan Sells, MD;  Location: [facility];  Service: Orthopedics;  Laterality: Right;  Right shoulder arthroscopy rotator cuff repair, subacromial decompression.  . TONSILLECTOMY      Family History  Problem Relation Age  of Onset  . Sleep apnea Sister   . Colon cancer Sister     Social History:  Jordan [name] is 79 years old and checks in on an aunt in ALF. He used to be Charity fundraiser. Independent and goes to [facility] 3 days a week. He  reports that he quit smoking about 52 years ago. His smoking use included cigarettes. He has a 15.00 pack-year smoking history. He has never used smokeless tobacco. He reports current alcohol use--last 3-4 months ago. He reports that he does not use drugs.    Allergies: No Known Allergies    Medications Prior to Admission  Medication Sig Dispense Refill  . amphetamine-dextroamphetamine (ADDERALL) 10 MG tablet 1-3 tabs daily as needed (Patient taking differently: Take 10 mg by mouth See admin instructions. 1-3 tabs daily as needed) 90 tablet 0  . atorvastatin (LIPITOR) 40 MG tablet Take 20 mg by mouth daily before breakfast.     . clopidogrel (PLAVIX) 75 MG tablet Take 1 tablet (75 mg total) by mouth daily. 30 tablet 1  . glucosamine-chondroitin 500-400 MG tablet Take 2 tablets by mouth daily.    [name] lisinopril (ZESTRIL) 10 MG tablet Take 10 mg by mouth daily.    . metFORMIN (GLUCOPHAGE-XR) 500 MG 24 hr tablet Take 2,000 mg by mouth daily with breakfast.     . Multiple Vitamin (MULTIVITAMIN WITH MINERALS) TABS tablet Take 1 tablet by mouth daily.    [name] NITROSTAT 0.4 MG SL tablet PLACE 1 TABLET (0.4 MG TOTAL) UNDER THE TONGUE EVERY 5 (FIVE) MINUTES AS NEEDED FOR CHEST PAIN. (Patient taking differently: Place 0.4 mg under the tongue every 5 (five) minutes as needed for chest pain. ) 25 tablet 5  . Omega 3 1200 MG CAPS Take 7,200 mg by mouth daily.       Drug Regimen Review  Drug regimen was reviewed and remains appropriate with no significant issues identified  Home: Home Living Family/patient expects to be discharged to:: Private residence Living Arrangements: Spouse/significant other, Children Available Help at Discharge: Family Type of Home: Apartment Home Access:  Elevator Home Layout: One level Bathroom Shower/Tub: Public librarian, Multimedia programmer: Standard Bathroom Accessibility: Yes Home Equipment: Environmental consultant - 2 wheels, Cane - single point Additional Comments: wife 8 years old and assisting with her 97 at ALF in [place]; wife to have hip replacement end of June  Lives With: Spouse, Son   Functional History: Prior Function Level of Independence: Independent Comments: 55 year old son with Aspergers; wife 35 years old with hip replacement surgery end of June  Functional Status:  Mobility: Bed Mobility Overal bed mobility: Modified Independent Bed Mobility: Supine to Sit Supine to sit: Supervision Sit to supine: Supervision General bed mobility comments: HOB elevated and use of rail  Transfers Overall transfer level: Needs assistance Equipment used: None Transfers: Sit to/from Stand Sit to Stand: Min assist Stand pivot transfers: Min assist General transfer comment: assist to steady from EOB and recliner Ambulation/Gait Ambulation/Gait assistance: Min assist, Mod assist  Gait Distance (Feet):  (90 ft X 2 trials with seated break) Assistive device: Straight cane (assist at trunk with gait belt) Gait Pattern/deviations: Decreased stride length, Step-through pattern, Decreased step length - right, Drifts right/left General Gait Details: pt with varying gait speeds and often moving too quickly givne deficits; pt with anterior bias and cues required for decreased gait speed, uprigh posture/forward gaze, and sequencing; required assistance for balance  Gait velocity: decr    ADL: ADL Overall ADL's : Needs assistance/impaired Grooming: Oral care, Minimal assistance, Standing Grooming Details (indicate cue type and reason): min assist to manage items at sink, at times impaired balance with R knee buckling and required min-mod assist for balance  Upper Body Bathing: Minimal assistance, Sitting Lower Body Bathing: Sit to/from  stand, Moderate assistance Lower Body Bathing Details (indicate cue type and reason): figure 4 technique for feet, poor balance dynamically, min assist sit to stand  Upper Body Dressing : Moderate assistance, Sitting Upper Body Dressing Details (indicate cue type and reason): to don new gown  Lower Body Dressing: Maximal assistance, Sit to/from stand Lower Body Dressing Details (indicate cue type and reason): figure 4 technique to adjust socks, relies on UE and external support in standing; min assist for sit to stand  Toilet Transfer: Minimal assistance, Ambulation Toilet Transfer Details (indicate cue type and reason): simulated in room Toileting - Clothing Manipulation Details (indicate cue type and reason): incontinent of bladder at sink, reports normally wearing briefs at home; total assist to clean up Functional mobility during ADLs: Minimal assistance, Cueing for safety, Cueing for sequencing General ADL Comments: pt limited by R sided weakness and coordnation, impaired balance, decreased activity tolerance  Cognition: Cognition Overall Cognitive Status: Within Functional Limits for tasks assessed Arousal/Alertness: Awake/alert Orientation Level: Oriented X4 Attention: Focused Focused Attention: Appears intact Memory: Appears intact Awareness: Appears intact Problem Solving: Appears intact Executive Function: Reasoning Reasoning: Appears intact Safety/Judgment: Appears intact Cognition Arousal/Alertness: Awake/alert Behavior During Therapy: WFL for tasks assessed/performed, Impulsive Overall Cognitive Status: Within Functional Limits for tasks assessed Area of Impairment: Safety/judgement Current Attention Level: Sustained Memory: Decreased short-term memory, Decreased recall of precautions Following Commands: Follows one step commands consistently, Follows one step commands with increased time, Follows multi-step commands consistently Safety/Judgement: Decreased awareness of  safety, Decreased awareness of deficits Awareness: Emergent Problem Solving: Slow processing, Decreased initiation, Difficulty sequencing, Requires verbal cues, Requires tactile cues General Comments: a little impulsive and decreased safety awareness but otherwise [facility] for mobility tasks   Blood pressure 106/68, pulse 67, temperature 98.2 F (36.8 C), temperature source Oral, resp. rate 18, height 5\' 8"  (1.727 m), weight 107.1 kg, SpO2 97 %. Physical Exam  Nursing note and vitals reviewed. Constitutional: He is oriented to person, place, and time. No distress.  HENT:  Head: Normocephalic and atraumatic.  Right Ear: External ear normal.  Left Ear: External ear normal.  Nose: Nose normal.  Eyes: Right eye exhibits no discharge. Left eye exhibits no discharge.  Respiratory: Effort normal. No stridor. No respiratory distress.  GI: Normal appearance. He exhibits no distension.  Musculoskeletal:     Cervical back: Normal range of motion.     Comments: No edema or tenderness in extremities  Neurological: He is alert and oriented to person, place, and time. He displays weakness. A sensory deficit ([name]--not aware of it in space) is present.  Motor: LUE/LLE: 5/5 proximal distal [name]/RLE: 4+-5/5 proximal distal Sensation subjectively diminished to light touch in right hand  Skin: Skin is warm and dry.  Psychiatric: His behavior is normal. Mood normal.    Results for orders placed or performed during the hospital encounter of 04/14/20 (from the past 48 hour(s))  Glucose, capillary     Status: Abnormal   Collection Time: 04/16/20  4:26 PM  Result Value Ref Range   Glucose-Capillary 172 (H) 70 - 99 mg/dL    Comment: Glucose reference range applies only to samples taken after fasting for at least 8 hours.  Glucose, capillary     Status: Abnormal   Collection Time: 04/16/20  9:10 PM  Result Value Ref Range   Glucose-Capillary 178 (H) 70 - 99 mg/dL    Comment: Glucose reference range applies  only to samples taken after fasting for at least 8 hours.   Comment 1 Notify RN    Comment 2 Document in Chart   Glucose, capillary     Status: Abnormal   Collection Time: 04/17/20  6:07 AM  Result Value Ref Range   Glucose-Capillary 138 (H) 70 - 99 mg/dL    Comment: Glucose reference range applies only to samples taken after fasting for at least 8 hours.   Comment 1 Notify RN    Comment 2 Document in Chart   Glucose, capillary     Status: Abnormal   Collection Time: 04/17/20  8:29 AM  Result Value Ref Range   Glucose-Capillary 119 (H) 70 - 99 mg/dL    Comment: Glucose reference range applies only to samples taken after fasting for at least 8 hours.  Glucose, capillary     Status: Abnormal   Collection Time: 04/17/20 11:36 AM  Result Value Ref Range   Glucose-Capillary 180 (H) 70 - 99 mg/dL    Comment: Glucose reference range applies only to samples taken after fasting for at least 8 hours.  Glucose, capillary     Status: Abnormal   Collection Time: 04/17/20  5:33 PM  Result Value Ref Range   Glucose-Capillary 172 (H) 70 - 99 mg/dL    Comment: Glucose reference range applies only to samples taken after fasting for at least 8 hours.  Glucose, capillary     Status: Abnormal   Collection Time: 04/17/20  9:22 PM  Result Value Ref Range   Glucose-Capillary 166 (H) 70 - 99 mg/dL    Comment: Glucose reference range applies only to samples taken after fasting for at least 8 hours.  Glucose, capillary     Status: Abnormal   Collection Time: 04/18/20  6:16 AM  Result Value Ref Range   Glucose-Capillary 129 (H) 70 - 99 mg/dL    Comment: Glucose reference range applies only to samples taken after fasting for at least 8 hours.  Glucose, capillary     Status: Abnormal   Collection Time: 04/18/20 11:37 AM  Result Value Ref Range   Glucose-Capillary 184 (H) 70 - 99 mg/dL    Comment: Glucose reference range applies only to samples taken after fasting for at least 8 hours.   No results  found.     Medical Problem List and Plan: 1.  Deficits with mobility, endurance, balance, self-care secondary to acute left thalamic infarct.  -patient may shower  -ELOS/Goals: 6-9 days/Supervision/Mod I  Admit to CIR 2.  Antithrombotics: -DVT/anticoagulation:  Pharmaceutical: Lovenox  -antiplatelet therapy: DAPT 3. Pain Management: N/A 4. Mood: LCSW to follow for evaluation and support.   -antipsychotic agents: N/A 5. Neuropsych: This patient is capable of making decisions on his own behalf. 6. Skin/Wound Care: routine pressure relief measures.  7. Fluids/Electrolytes/Nutrition:  Monitor I/O.  CMP ordered. 8. HTN: Monitor BP tid--off lisinopril at this time.   Monitor with increased mobility and with medications as necessary. 9.T2DM: Well controlled --Hgb A1C- 7.9. Will d/c Lantus--resume metformin as patient not interested in insulin. Will consult RD to educate on carb counting.  Monitor with increased mobility. 10. Dyslipidemia: Continue Lipitor. 11. ABLA: CBC ordered. 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  69. Morbid obesity: Encouraged weight loss  Bary Leriche, PA-C 04/18/2020  I have personally performed a face to face diagnostic evaluation, including, but not limited to relevant history and physical exam findings, of this patient and developed relevant assessment and plan.  Additionally, I have reviewed and concur with the physician assistant's documentation above.  Delice Lesch, MD, ABPMR

## 2020-04-18 NOTE — IPOC Note (Addendum)
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[place]) Active Problems:   Hypoalbuminemia due to protein-calorie malnutrition ([place])   Controlled type 2 diabetes mellitus with hyperglycemia, without long-term current use of insulin ([place])     Functional Problem List: Nursing Bladder, Medication Management, Bowel  PT Balance, Perception, Behavior, Safety, Edema, Sensory, Endurance, Skin Integrity, Motor, Nutrition, Pain  OT Balance, Motor, Sensory, Safety  SLP    TR         Basic ADL's: OT Grooming, Bathing, Dressing, Toileting     Advanced  ADL's: OT Light Housekeeping     Transfers: PT Bed Mobility, Bed to Chair, Car, Manufacturing systems engineer, Metallurgist: PT Ambulation, Stairs     Additional Impairments: OT None  SLP        TR      Anticipated Outcomes Item Anticipated Outcome  Self Feeding No goal  Swallowing      Basic self-care  Supervision/setup-Mod I  Toileting  Mod I   Bathroom Transfers Supervision/setup-Mod I  Bowel/Bladder  remain continent of B/B, maintain regular patter of emptying  Transfers  mod-I  Locomotion  mod-I household ambulation  Communication     Cognition     Pain  no pain  Safety/Judgment  no falls, infection, skin breakdoq   Therapy Plan: PT Intensity: Minimum of 1-2 x/day ,45 to 90 minutes PT Frequency: 5 out of 7 days PT Duration Estimated Length of Stay: ~ 7 days OT Intensity: Minimum of 1-2 x/day, 45 to 90 minutes OT Frequency: 5 out of 7 days OT Duration/Estimated Length of Stay: 7-9 days      Team Interventions: Nursing Interventions Patient/Family Education, Medication Management, Disease Management/Prevention, Discharge Planning  PT interventions Ambulation/gait training, Community reintegration, DME/adaptive equipment instruction,  Neuromuscular re-education, Psychosocial support, Stair training, UE/LE Strength taining/ROM, Training and development officer, Discharge planning, Functional electrical stimulation, Pain management, Skin care/wound management, Therapeutic Activities, UE/LE Coordination activities, Cognitive remediation/compensation, Functional mobility training, Disease management/prevention, Patient/family education, Splinting/orthotics, Therapeutic Exercise, Visual/perceptual remediation/compensation  OT Interventions Training and development officer, Community reintegration, Disease mangement/prevention, Neuromuscular re-education, Patient/family education, Self Care/advanced ADL retraining, Therapeutic Exercise, UE/LE Coordination activities, Wheelchair propulsion/positioning, UE/LE Strength taining/ROM, Therapeutic Activities, Psychosocial support, Pain management, Functional mobility training, DME/adaptive equipment instruction, Discharge planning  SLP Interventions    TR Interventions    SW/CM Interventions     Barriers to Discharge MD  Medical stability, Lack of/limited family support and Weight  Nursing      PT Decreased caregiver support, Behavior    OT Medical stability    SLP      SW       Team Discharge Planning: Destination: PT-Home ,OT- Home , SLP-  Projected Follow-up: PT-Outpatient PT, OT-  Home health OT, SLP-  Projected Equipment Needs: PT-To be determined, Rolling walker with 5" wheels, OT- To be determined, SLP-  Equipment Details: PT- , OT-  Patient/family involved in discharge planning: PT- Patient,  OT-Patient, SLP-   MD ELOS: 5-7 days. Medical Rehab Prognosis:  Good Assessment: 79 year old male RH male with history of CAD, T2DM, OSA- on CPAP, SSS s/p PPM; who was admitted on 04/14/2020 with right-sided numbness and tingling.  History taken from chart review and patient.  BP noted to be elevated at admission.  CT head was unremarkable for acute intracranial process.  CTA head/neck was  negative for LVO  high-grade stenosis.  Echocardiogram with EF of 65-70%.  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  Dr. Erlinda Hong felt the stroke was secondary to small vessel disease and aspirin was added to Plavix with recommendations to stop aspirin after 3 weeks.  Blood pressure is being monitored with permissive hypertension.  Lantus added for management of blood sugars.  Hospital course further complicated by mild drop in H&H.  Therapy has been ongoing he continues to have deficits in balance and safety affecting ADLs and mobility.    Will set goals for Mod I with PT/OT.     Due to the current state of emergency, patients may not be receiving their 3-hours of Medicare-mandated therapy.  See Team Conference Notes for weekly updates to the plan of care

## 2020-04-18 NOTE — Discharge Summary (Signed)
Physician Discharge Summary  [name] Jordan XBJ:[id number] DOB: [birth year] DOA: 04/14/2020  PCP: [name], MD  Admit date: 04/14/2020 Discharge date: 04/18/2020  Admitted From: Home Disposition: CIR  Recommendations for Outpatient Follow-up:  1. Follow ups as below. 2. Please obtain CBC/BMP/Mag at follow up 3. Please follow up on the following pending results: None   Discharge Condition: Stable CODE STATUS: Full code   Follow-up Information    [facility]. Schedule an appointment as soon as possible for a visit in 4 week(s).   Specialty: Neurology Contact information: [address] [place] [phone]              Hospital Course: 79 year old male with history of DM-2, CVA, OSA on CPAP, narcolepsy, SSS/PPM and morbid obesity presenting with right-sided numbness and tingling more in the arm than legs and admitted for CVA work-up.  On arrival to ED, hemodynamically stable although his blood pressure was elevated later on.  Labs including CMP and CBC with differential without significant finding other than elevated glucose.  UA, COVID-19 PCR, UDS, CT head without contrast and CTA head and neck without acute finding.  Neurology consulted and admitted for CVA work-up.   The next day, MRI brain revealed acute left thalamic stroke.  Echo with EF of 65 to 70%, G2 DD and RVSP of 31 mmHg but no PFO or thrombus.  A1c 7.9%.  LDL 57.  TSH within normal.  Neurology recommended DAPT with low-dose aspirin and Plavix for 3 weeks followed by Plavix alone, and outpatient follow-up in 4 weeks.  Evaluated by therapy who recommended CIR.  See individual problem list below for more hospital course.  Discharge Diagnoses:  Right-sided paresthesia/acute left thalamic stroke-noted on MRI brain.  CTA head and neck without acute finding.  Echo with EF of 65 to 70%, G2 DD and RVSP of 31 mmHg but no PFO or thrombus.  A1c 7.9%.  LDL 57.   TSH within normal.  UDS negative.  No arrhythmia on PPM interrogation. -Neurology following and recommended DAPT Plavix and low aspirin for 3 weeks followed by Plavix alone. -Optimize risk factors -Resumed home antihypertensive medications.  -Continue statin.  Uncontrolled DM-2 with hyperglycemia: A1c 7.9%. -Resumed home medications.  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  Appears euvolemic.  Good urine output without diuretics. -Started Lasix 40 mg daily as needed  Essential hypertension: BP slightly elevated. -Resumed home antihypertensive meds.  OSA on CPAP-compliant. 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   Morbid obesity Body mass index is 35.9 kg/m.            Discharge Exam: Vitals:   04/18/20 0416 04/18/20 0752  BP: (!) 152/74 132/74  Pulse: 63 64  Resp: 19 18  Temp: 98 F (36.7 C) (!) 97.5 F (36.4 C)  SpO2: 90% 95%    GENERAL: No apparent distress.  Nontoxic. HEENT: MMM.  Vision and hearing grossly intact.  NECK: Supple.  No apparent JVD.  RESP:  No IWOB.  Fair aeration bilaterally. CVS:  RRR. Heart sounds normal.  ABD/GI/GU: Bowel sounds present. Soft. Non tender.  MSK/EXT:  Moves extremities. No apparent deformity. No edema.  SKIN: no apparent skin lesion or wound NEURO: Awake, alert and oriented appropriately.  No apparent focal neuro deficit. PSYCH: Calm. Normal affect.  Discharge Instructions  Discharge Instructions    Ambulatory referral to Neurology   Complete by: As directed  Follow up with stroke clinic NP ([name] Canby or Cecille Rubin, if both not available, consider Zachery Dauer, or Ahern) at [facility] in about 4 weeks. Thanks.   Diet - low sodium heart healthy   Complete by: As directed    Diet Carb Modified   Complete by: As directed    Increase activity slowly   Complete by: As directed      Allergies as of  04/18/2020   No Known Allergies     Medication List    STOP taking these medications   Omega 3 1200 MG Caps     TAKE these medications   amphetamine-dextroamphetamine 10 MG tablet Commonly known as: ADDERALL 1-3 tabs daily as needed What changed:   how much to take  how to take this  when to take this   aspirin 81 MG EC tablet Take 1 tablet (81 mg total) by mouth daily for 21 days. Swallow whole.   atorvastatin 40 MG tablet Commonly known as: LIPITOR Take 20 mg by mouth daily before breakfast.   clopidogrel 75 MG tablet Commonly known as: PLAVIX Take 1 tablet (75 mg total) by mouth daily.   furosemide 40 MG tablet Commonly known as: Lasix Take 1 tablet (40 mg total) by mouth daily as needed for fluid or edema (shortness of breath).   glucosamine-chondroitin 500-400 MG tablet Take 2 tablets by mouth daily.   lisinopril 10 MG tablet Commonly known as: ZESTRIL Take 10 mg by mouth daily.   metFORMIN 500 MG 24 hr tablet Commonly known as: GLUCOPHAGE-XR Take 2,000 mg by mouth daily with breakfast.   multivitamin with minerals Tabs tablet Take 1 tablet by mouth daily.   Nitrostat 0.4 MG SL tablet Generic drug: nitroGLYCERIN PLACE 1 TABLET (0.4 MG TOTAL) UNDER THE TONGUE EVERY 5 (FIVE) MINUTES AS NEEDED FOR CHEST PAIN. What changed: See the new instructions.       Consultations:  Neurology  Procedures/Studies:  2D Echo on 04/15/2020 1. Left ventricular ejection fraction, by estimation, is 65 to 70%. The  left ventricle has normal function. The left ventricle has no regional  wall motion abnormalities. There is mild concentric left ventricular  hypertrophy. Left ventricular diastolic  parameters are consistent with Grade II diastolic dysfunction  (pseudonormalization). Elevated left atrial pressure.  2. Right ventricular systolic function is normal. The right ventricular  size is normal. There is mildly elevated pulmonary artery systolic  pressure. The  estimated right ventricular systolic pressure is 47.6 mmHg.  3. Left atrial size was mildly dilated.  4. The mitral valve is normal in structure. No evidence of mitral valve  regurgitation. No evidence of mitral stenosis.  5. The aortic valve is normal in structure. Aortic valve regurgitation is  mild. No aortic stenosis is present.  6. The inferior vena cava is dilated in size with <50% respiratory  variability, suggesting right atrial pressure of 15 mmHg.    CT ANGIO HEAD W OR WO CONTRAST  Result Date: 04/14/2020 CLINICAL DATA:  Right-sided sensory deficit EXAM: CT ANGIOGRAPHY HEAD AND NECK TECHNIQUE: Multidetector CT imaging of the head and neck was performed using the standard protocol during bolus administration of intravenous contrast. Multiplanar CT image reconstructions and MIPs were obtained to evaluate the vascular anatomy. Carotid stenosis measurements (when applicable) are obtained utilizing NASCET criteria, using the distal internal carotid diameter as the denominator. CONTRAST:  49mL OMNIPAQUE IOHEXOL 350 MG/ML SOLN COMPARISON:  Head CT 04/14/2020 FINDINGS: CTA NECK FINDINGS SKELETON: There is no bony spinal canal stenosis.  No lytic or blastic lesion. OTHER NECK: Normal pharynx, larynx and major salivary glands. No cervical lymphadenopathy. Unremarkable thyroid gland. UPPER CHEST: No pneumothorax or pleural effusion. No nodules or masses. AORTIC ARCH: There is mild calcific atherosclerosis of the aortic arch. There is no aneurysm, dissection or hemodynamically significant stenosis of the visualized portion of the aorta. Conventional 3 vessel aortic branching pattern. The visualized proximal subclavian arteries are widely patent. RIGHT CAROTID SYSTEM: No dissection, occlusion or aneurysm. Mild atherosclerotic calcification at the carotid bifurcation without hemodynamically significant stenosis. LEFT CAROTID SYSTEM: No dissection, occlusion or aneurysm. Mild atherosclerotic calcification  at the carotid bifurcation without hemodynamically significant stenosis. VERTEBRAL ARTERIES: Codominant configuration. Both origins are clearly patent. There is no dissection, occlusion or flow-limiting stenosis to the skull base (V1-V3 segments). CTA HEAD FINDINGS POSTERIOR CIRCULATION: --Vertebral arteries: Calcific atherosclerosis. --Inferior cerebellar arteries: Normal. --Basilar artery: Normal. --Superior cerebellar arteries: Normal. --Posterior cerebral arteries (PCA): Normal. ANTERIOR CIRCULATION: --Intracranial internal carotid arteries: Normal. --Anterior cerebral arteries (ACA): Normal. Both A1 segments are present. Patent anterior communicating artery (a-comm). --Middle cerebral arteries (MCA): Normal. VENOUS SINUSES: As permitted by contrast timing, patent. ANATOMIC VARIANTS: None Review of the MIP images confirms the above findings. IMPRESSION: 1. No emergent large vessel occlusion or high-grade stenosis of the intracranial or cervical arteries. 2. Aortic Atherosclerosis (ICD10-I70.0). Electronically Signed   By: Ulyses Jarred M.D.   On: 04/14/2020 21:54   CT ANGIO NECK W OR WO CONTRAST  Result Date: 04/14/2020 CLINICAL DATA:  Right-sided sensory deficit EXAM: CT ANGIOGRAPHY HEAD AND NECK TECHNIQUE: Multidetector CT imaging of the head and neck was performed using the standard protocol during bolus administration of intravenous contrast. Multiplanar CT image reconstructions and MIPs were obtained to evaluate the vascular anatomy. Carotid stenosis measurements (when applicable) are obtained utilizing NASCET criteria, using the distal internal carotid diameter as the denominator. CONTRAST:  42mL OMNIPAQUE IOHEXOL 350 MG/ML SOLN COMPARISON:  Head CT 04/14/2020 FINDINGS: CTA NECK FINDINGS SKELETON: There is no bony spinal canal stenosis. No lytic or blastic lesion. OTHER NECK: Normal pharynx, larynx and major salivary glands. No cervical lymphadenopathy. Unremarkable thyroid gland. UPPER CHEST: No  pneumothorax or pleural effusion. No nodules or masses. AORTIC ARCH: There is mild calcific atherosclerosis of the aortic arch. There is no aneurysm, dissection or hemodynamically significant stenosis of the visualized portion of the aorta. Conventional 3 vessel aortic branching pattern. The visualized proximal subclavian arteries are widely patent. RIGHT CAROTID SYSTEM: No dissection, occlusion or aneurysm. Mild atherosclerotic calcification at the carotid bifurcation without hemodynamically significant stenosis. LEFT CAROTID SYSTEM: No dissection, occlusion or aneurysm. Mild atherosclerotic calcification at the carotid bifurcation without hemodynamically significant stenosis. VERTEBRAL ARTERIES: Codominant configuration. Both origins are clearly patent. There is no dissection, occlusion or flow-limiting stenosis to the skull base (V1-V3 segments). CTA HEAD FINDINGS POSTERIOR CIRCULATION: --Vertebral arteries: Calcific atherosclerosis. --Inferior cerebellar arteries: Normal. --Basilar artery: Normal. --Superior cerebellar arteries: Normal. --Posterior cerebral arteries (PCA): Normal. ANTERIOR CIRCULATION: --Intracranial internal carotid arteries: Normal. --Anterior cerebral arteries (ACA): Normal. Both A1 segments are present. Patent anterior communicating artery (a-comm). --Middle cerebral arteries (MCA): Normal. VENOUS SINUSES: As permitted by contrast timing, patent. ANATOMIC VARIANTS: None Review of the MIP images confirms the above findings. IMPRESSION: 1. No emergent large vessel occlusion or high-grade stenosis of the intracranial or cervical arteries. 2. Aortic Atherosclerosis (ICD10-I70.0). Electronically Signed   By: Ulyses Jarred M.D.   On: 04/14/2020 21:54   MR BRAIN WO CONTRAST  Result Date: 04/15/2020 CLINICAL DATA:  TIA. Right-sided numbness and  tingling. History of stroke. EXAM: MRI HEAD WITHOUT CONTRAST TECHNIQUE: Multiplanar, multiecho pulse sequences of the brain and surrounding structures  were obtained without intravenous contrast. COMPARISON:  CT head 04/14/2020 FINDINGS: Brain: Acute infarct left thalamus with patchy areas of restricted diffusion. No other acute infarct. Generalized atrophy. Chronic microvascular ischemic changes in the white matter. Chronic infarct left frontal lobe anteriorly. Small chronic infarcts in the cerebellum bilaterally. Chronic infarct right thalamus. Negative for hemorrhage or mass. No hydrocephalus. Vascular: Normal arterial flow voids. Skull and upper cervical spine: No focal skeletal lesion. Cervical degenerative changes. Sinuses/Orbits: Mucosal edema paranasal sinuses most notably in the left frontal sinus, left ethmoid and maxillary sinuses. No orbital lesion.  Bilateral cataract extraction. Other: None IMPRESSION: Acute infarct left thalamus Atrophy and chronic ischemic changes as above. Electronically Signed   By: Franchot Gallo M.D.   On: 04/15/2020 13:33   ECHOCARDIOGRAM COMPLETE  Result Date: 04/15/2020    ECHOCARDIOGRAM REPORT   Patient Name:   Jordan [name] Date of Exam: 04/15/2020 Medical Rec #:  [id number]       Height:       68.0 in Accession #:    [id number]      Weight:       236.1 lb Date of Birth:  [birth year]       BSA:          2.193 m Patient Age:    3 years        BP:           112/63 mmHg Patient Gender: M               HR:           55 bpm. Exam Location:  Inpatient Procedure: 2D Echo, Cardiac Doppler and Color Doppler Indications:    TIA  History:        Patient has prior history of Echocardiogram examinations, most                 recent 02/17/2018. CAD, Pacemaker and Abnormal ECG, TIA and                 Stroke, Signs/Symptoms:Dyspnea; Risk Factors:Sleep Apnea,                 Hypertension, Dyslipidemia and Diabetes.  Sonographer:    Roseanna Rainbow RDCS Referring Phys: [id number] JENNIFER YATES  Sonographer Comments: Technically difficult study due to poor echo windows and patient is morbidly obese. Image acquisition challenging due to patient body  habitus. IMPRESSIONS  1. Left ventricular ejection fraction, by estimation, is 65 to 70%. The left ventricle has normal function. The left ventricle has no regional wall motion abnormalities. There is mild concentric left ventricular hypertrophy. Left ventricular diastolic parameters are consistent with Grade II diastolic dysfunction (pseudonormalization). Elevated left atrial pressure.  2. Right ventricular systolic function is normal. The right ventricular size is normal. There is mildly elevated pulmonary artery systolic pressure. The estimated right ventricular systolic pressure is 34.2 mmHg.  3. Left atrial size was mildly dilated.  4. The mitral valve is normal in structure. No evidence of mitral valve regurgitation. No evidence of mitral stenosis.  5. The aortic valve is normal in structure. Aortic valve regurgitation is mild. No aortic stenosis is present.  6. The inferior vena cava is dilated in size with <50% respiratory variability, suggesting right atrial pressure of 15 mmHg. FINDINGS  Left Ventricle: Left ventricular ejection fraction, by estimation, is 65 to 70%. The left  ventricle has normal function. The left ventricle has no regional wall motion abnormalities. The left ventricular internal cavity size was normal in size. There is  mild concentric left ventricular hypertrophy. Left ventricular diastolic parameters are consistent with Grade II diastolic dysfunction (pseudonormalization). Elevated left atrial pressure. Right Ventricle: The right ventricular size is normal. No increase in right ventricular wall thickness. Right ventricular systolic function is normal. There is mildly elevated pulmonary artery systolic pressure. The tricuspid regurgitant velocity is 1.98  m/s, and with an assumed right atrial pressure of 15 mmHg, the estimated right ventricular systolic pressure is 80.9 mmHg. Left Atrium: Left atrial size was mildly dilated. Right Atrium: Right atrial size was normal in size.  Pericardium: There is no evidence of pericardial effusion. Mitral Valve: The mitral valve is normal in structure. Normal mobility of the mitral valve leaflets. Mild mitral annular calcification. No evidence of mitral valve regurgitation. No evidence of mitral valve stenosis. Tricuspid Valve: The tricuspid valve is normal in structure. Tricuspid valve regurgitation is not demonstrated. No evidence of tricuspid stenosis. Aortic Valve: The aortic valve is normal in structure. Aortic valve regurgitation is mild. No aortic stenosis is present. Pulmonic Valve: The pulmonic valve was normal in structure. Pulmonic valve regurgitation is not visualized. No evidence of pulmonic stenosis. Aorta: The aortic root is normal in size and structure. Venous: The inferior vena cava is dilated in size with less than 50% respiratory variability, suggesting right atrial pressure of 15 mmHg. IAS/Shunts: No atrial level shunt detected by color flow Doppler. Additional Comments: A pacer wire is visualized.  LEFT VENTRICLE PLAX 2D LVIDd:         4.80 cm     Diastology LVIDs:         3.30 cm     LV e' lateral:   7.40 cm/s LV PW:         1.20 cm     LV E/e' lateral: 17.5 LV IVS:        1.40 cm     LV e' medial:    5.00 cm/s LVOT diam:     2.00 cm     LV E/e' medial:  25.9 LV SV:         67 LV SV Index:   31 LVOT Area:     3.14 cm  LV Volumes (MOD) LV vol d, MOD A2C: 75.7 ml LV vol d, MOD A4C: 96.6 ml LV vol s, MOD A2C: 20.7 ml LV vol s, MOD A4C: 25.2 ml LV SV MOD A2C:     55.0 ml LV SV MOD A4C:     96.6 ml LV SV MOD BP:      62.2 ml RIGHT VENTRICLE            IVC RV S prime:     6.74 cm/s  IVC diam: 2.60 cm TAPSE (M-mode): 1.4 cm LEFT ATRIUM             Index       RIGHT ATRIUM           Index LA diam:        3.90 cm 1.78 cm/m  RA Area:     12.10 cm LA Vol (A2C):   24.8 ml 11.31 ml/m RA Volume:   21.20 ml  9.67 ml/m LA Vol (A4C):   39.8 ml 18.15 ml/m LA Biplane Vol: 32.0 ml 14.59 ml/m  AORTIC VALVE LVOT Vmax:   131.00 cm/s LVOT Vmean:   98.300 cm/s LVOT VTI:  0.213 m  AORTA Ao Root diam: 3.90 cm Ao Asc diam:  3.30 cm MITRAL VALVE                TRICUSPID VALVE MV Area (PHT): 3.72 cm     TR Peak grad:   15.7 mmHg MV Decel Time: 204 msec     TR Vmax:        198.00 cm/s MV E velocity: 129.33 cm/s MV A velocity: 106.00 cm/s  SHUNTS MV E/A ratio:  1.22         Systemic VTI:  0.21 m                             Systemic Diam: 2.00 cm Dani Gobble Croitoru MD Electronically signed by Sanda Klein MD Signature Date/Time: 04/15/2020/11:06:17 AM    Final    CT HEAD CODE STROKE WO CONTRAST  Result Date: 04/14/2020 CLINICAL DATA:  Code stroke.  Right arm weakness. EXAM: CT HEAD WITHOUT CONTRAST TECHNIQUE: Contiguous axial images were obtained from the base of the skull through the vertex without intravenous contrast. COMPARISON:  Head CT 02/16/2018 and MRI 02/17/2018 FINDINGS: Brain: There is no evidence of acute infarct, intracranial hemorrhage, mass, midline shift, or extra-axial fluid collection. Small chronic infarcts in the anterior left frontal lobe, medial right occipital lobe, and cerebellum are unchanged. Hypodensities in the cerebral white matter bilaterally are unchanged and nonspecific but compatible with mild chronic small vessel ischemic disease. There is mild cerebral atrophy. Vascular: Calcified atherosclerosis at the skull base. No hyperdense vessel. Skull: No fracture or suspicious osseous lesion. Sinuses/Orbits: New complete opacification of the left frontal sinus and left anterior ethmoid air cells. Near complete opacification of the included portion of the left maxillary sinus with osseous sinus wall thickening. Clear mastoid air cells. Bilateral cataract extraction. Other: None. ASPECTS [facility] Stroke Program Early CT Score) - Ganglionic level infarction (caudate, lentiform nuclei, internal capsule, insula, M1-M3 cortex): 7 - Supraganglionic infarction (M4-M6 cortex): 3 Total score (0-10 with 10 being normal): 10 IMPRESSION: 1. No  evidence of acute intracranial abnormality. 2. ASPECTS is 10. 3. 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 4. Chronic appearing left-sided sinusitis in an ostiomeatal unit obstruction pattern. These results were called by telephone at the time of interpretation on 04/14/2020 at 12:55 pm to Dr. Orlena Sheldon, who verbally acknowledged these results. Electronically Signed   By: Logan Bores M.D.   On: 04/14/2020 12:56        The results of significant diagnostics from this hospitalization (including imaging, microbiology, ancillary and laboratory) are listed below for reference.     Microbiology: Recent Results (from the past 240 hour(s))  SARS Coronavirus 2 by RT PCR (hospital order, performed in [facility] hospital lab) Nasopharyngeal Nasopharyngeal Swab     Status: None   Collection Time: 04/14/20  3:33 PM   Specimen: Nasopharyngeal Swab  Result Value Ref Range Status   SARS Coronavirus 2 NEGATIVE NEGATIVE Final    Comment: (NOTE) SARS-CoV-2 target nucleic acids are NOT DETECTED.  The SARS-CoV-2 RNA is generally detectable in upper and lower respiratory specimens during the acute phase of infection. The lowest concentration of SARS-CoV-2 viral copies this assay can detect is 250 copies / mL. A negative result does not preclude SARS-CoV-2 infection and should not be used as the sole basis for treatment or other patient management decisions.  A negative result may occur with improper specimen collection / handling, submission  of specimen other than nasopharyngeal swab, presence of viral mutation(s) within the areas targeted by this assay, and inadequate number of viral copies (<250 copies / mL). A negative result must be combined with clinical observations, patient history, and epidemiological information.  Fact Sheet for Patients:   StrictlyIdeas.no  Fact Sheet for Healthcare  Providers: BankingDealers.co.za  This test is not yet approved or  cleared by the [place] FDA and has been authorized for detection and/or diagnosis of SARS-CoV-2 by FDA under an Emergency Use Authorization (EUA).  This EUA will remain in effect (meaning this test can be used) for the duration of the COVID-19 declaration under Section 564(b)(1) of the Act, 21 U.S.C. section 360bbb-3(b)(1), unless the authorization is terminated or revoked sooner.  Performed at [facility], [place] [address] [place], [place] [address]      Labs: BNP (last 3 results) No results for input(s): BNP in the last 8760 hours. Basic Metabolic Panel: Recent Labs  Lab 04/14/20 1238 04/14/20 1243  NA 139 141  K 4.0 3.9  CL 108 107  CO2 21*  --   GLUCOSE 183* 181*  BUN 12 11  CREATININE 1.01 0.90  CALCIUM 9.4  --    Liver Function Tests: Recent Labs  Lab 04/14/20 1238  AST 33  ALT 23  ALKPHOS 59  BILITOT 0.8  PROT 6.6  ALBUMIN 3.5   No results for input(s): LIPASE, AMYLASE in the last 168 hours. No results for input(s): AMMONIA in the last 168 hours. CBC: Recent Labs  Lab 04/14/20 1238 04/14/20 1243  WBC 8.4  --   NEUTROABS 4.7  --   HGB 13.2 12.9*  HCT 40.2 38.0*  MCV 88.7  --   PLT 282  --    Cardiac Enzymes: No results for input(s): CKTOTAL, CKMB, CKMBINDEX, TROPONINI in the last 168 hours. BNP: Invalid input(s): POCBNP CBG: Recent Labs  Lab 04/17/20 1136 04/17/20 1733 04/17/20 2122 04/18/20 0616 04/18/20 1137  GLUCAP 180* 172* 166* 129* 184*   D-Dimer No results for input(s): DDIMER in the last 72 hours. Hgb A1c No results for input(s): HGBA1C in the last 72 hours. Lipid Profile No results for input(s): CHOL, HDL, LDLCALC, TRIG, CHOLHDL, LDLDIRECT in the last 72 hours. Thyroid function studies No results for input(s): TSH, T4TOTAL, T3FREE, THYROIDAB in the last 72 hours.  Invalid input(s): FREET3 Anemia work up No results for  input(s): VITAMINB12, FOLATE, FERRITIN, TIBC, IRON, RETICCTPCT in the last 72 hours. Urinalysis    Component Value Date/Time   COLORURINE YELLOW 04/14/2020 1700   APPEARANCEUR CLEAR 04/14/2020 1700   LABSPEC 1.014 04/14/2020 1700   PHURINE 5.0 04/14/2020 1700   GLUCOSEU NEGATIVE 04/14/2020 1700   HGBUR NEGATIVE 04/14/2020 1700   BILIRUBINUR NEGATIVE 04/14/2020 1700   KETONESUR NEGATIVE 04/14/2020 1700   PROTEINUR NEGATIVE 04/14/2020 1700   NITRITE NEGATIVE 04/14/2020 1700   LEUKOCYTESUR NEGATIVE 04/14/2020 1700   Sepsis Labs Invalid input(s): PROCALCITONIN,  WBC,  LACTICIDVEN   Time coordinating discharge: 35 minutes  SIGNED:  Mercy Riding, MD  [facility] 04/18/2020, 11:50 AM  If 7PM-7AM, please contact night-coverage [email or web address] Password [email or web address]

## 2020-04-18 NOTE — Progress Notes (Signed)
RT came to place patient on CPAP HS. Patient is already on CPAP tolerating well.  

## 2020-04-18 NOTE — Progress Notes (Signed)
Inpatient Rehabilitation Medication Review by a Pharmacist  A complete drug regimen review was completed for this patient to identify any potential clinically significant medication issues.  Clinically significant medication issues were identified:  yes   Type of Medication Issue Identified Description of Issue Urgent (address now) Non-Urgent (address on AM team rounds) Plan Plan Accepted by Provider? (Yes / No / Pending AM Rounds)  Drug Interaction(s) (clinically significant)       Duplicate Therapy       Allergy       No Medication Administration End Date       Incorrect Dose       Additional Drug Therapy Needed       Other  Medications dictated by Dr. Cyndia Skeeters for discharge not resumed: PRN Lasix, lisinopril and MV Non-Urgent Do not resume. Only resume previous acute inpatient meds No    Name of provider notified for urgent issues identified: Algis Liming, PA  Provider Method of Notification: chat   Time spent performing this drug regimen review (minutes):  10-65min  Jordan [name] S. Alford Highland, PharmD, BCPS Clinical Staff Pharmacist [email or web address]  Wayland Salinas 04/18/2020 4:18 PM

## 2020-04-18 NOTE — H&P (Signed)
Physical Medicine and Rehabilitation Admission H&P    Chief Complaint  Patient presents with  . Stroke with functional deficits.       HPI:  Jordan [name] is an 79 year old male RH male with history of CAD, T2DM, OSA- on CPAP, SSS s/p PPM; who was admitted on 04/14/2020 with right-sided numbness and tingling.  History taken from chart review and patient.  BP noted to be elevated at admission.  CT head was unremarkable for acute intracranial process.  CTA head/neck was negative for LVO high-grade stenosis.  Echocardiogram with EF of 65-70%.  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  Dr. Erlinda Hong felt the stroke was secondary to small vessel disease and aspirin was added to Plavix with recommendations to stop aspirin after 3 weeks.  Blood pressure is being monitored with permissive hypertension.  Lantus added for management of blood sugars.  Hospital course further complicated by mild drop in H&H.  Therapy has been ongoing he continues to have deficits in balance and safety affecting ADLs and mobility.  CIR was recommended for follow-up therapy.  Please see preadmission assessment mother today as well.   Review of Systems  Constitutional: Negative for chills and fever.  HENT: Negative for hearing loss and tinnitus.   Eyes: Negative for blurred vision and double vision.  Respiratory: Positive for cough (occasionally). Negative for shortness of breath.   Cardiovascular: Negative for chest pain, claudication and leg swelling.  Gastrointestinal: Negative for abdominal pain, constipation, heartburn and nausea.  Genitourinary: Negative for dysuria and frequency.  Musculoskeletal: Negative for back pain and myalgias.  Skin: Negative for itching and rash.  Neurological: Positive for sensory change, focal weakness and headaches (had one today--resolved without meds). Negative for dizziness.  Psychiatric/Behavioral: The patient is not nervous/anxious and  does not have insomnia.   All other systems reviewed and are negative.    Past Medical History:  Diagnosis Date  . Arthritis    R shoulder, bone spur  . Arthritis    "hands" (03/25/2016)  . Chronic lower back pain   . Coronary heart disease    Dr Pernell Dupre  . H/O cardiovascular stress test    perhaps last one was 2009  . Heart murmur    12/29/11 echo: mild MR, no AS, trivial TR  . History of gout   . Hyperlipidemia   . Hypertension    saw Dr. Linard Millers last earl;y- 2014, cardiac cath. last done ?2009, blocks seen didn't require any intervention at that point.  . Narcolepsy    [name] 04-11-97; Mean Latency 1.9min, SOREM 2  . OSA on CPAP    NPSG 12-13-98 AHI 22.7  . PONV (postoperative nausea and vomiting)   . Presence of permanent cardiac pacemaker   . Shortness of breath    with exertion   . Stroke ([place])   . Type II diabetes mellitus ([place])     Past Surgical History:  Procedure Laterality Date  . CARDIAC CATHETERIZATION  2009?  . EP IMPLANTABLE DEVICE N/A 03/25/2016   Procedure: Pacemaker Implant - Dual Chamber;  Surgeon: Evans Lance, MD;  Location: [facility];  Service: Cardiovascular;  Laterality: N/A;  . FRACTURE SURGERY    . INSERT / REPLACE / REMOVE PACEMAKER  03/24/2016  . LEFT AND RIGHT HEART CATHETERIZATION WITH CORONARY ANGIOGRAM N/A 06/05/2014   Procedure: LEFT AND RIGHT HEART CATHETERIZATION WITH CORONARY ANGIOGRAM;  Surgeon: Belva Crome III,  MD;  Location: [facility];  Service: Cardiovascular;  Laterality: N/A;  . NASAL FRACTURE SURGERY    . SHOULDER ARTHROSCOPY WITH ROTATOR CUFF REPAIR AND SUBACROMIAL DECOMPRESSION Right 08/16/2013   Procedure: SHOULDER ARTHROSCOPY WITH ROTATOR CUFF REPAIR AND SUBACROMIAL DECOMPRESSION;  Surgeon: [name], MD;  Location: [facility];  Service: Orthopedics;  Laterality: Right;  Right shoulder arthroscopy rotator cuff repair, subacromial decompression.  . TONSILLECTOMY      Family History  Problem Relation Age  of Onset  . Sleep apnea Sister   . Colon cancer Sister     Social History:  [name] is 25 years old and checks in on an aunt in ALF. He used to be Charity fundraiser. Independent and [facility] days a week. He  reports that he quit smoking about 52 years ago. His smoking use included cigarettes. He has a 15.00 pack-year smoking history. He has never used smokeless tobacco. He reports current alcohol use--last 3-4 months ago. He reports that he does not use drugs.    Allergies: No Known Allergies    Medications Prior to Admission  Medication Sig Dispense Refill  . amphetamine-dextroamphetamine (ADDERALL) 10 MG tablet 1-3 tabs daily as needed (Patient taking differently: Take 10 mg by mouth See admin instructions. 1-3 tabs daily as needed) 90 tablet 0  . atorvastatin (LIPITOR) 40 MG tablet Take 20 mg by mouth daily before breakfast.     . clopidogrel (PLAVIX) 75 MG tablet Take 1 tablet (75 mg total) by mouth daily. 30 tablet 1  . glucosamine-chondroitin 500-400 MG tablet Take 2 tablets by mouth daily.    [name] lisinopril (ZESTRIL) 10 MG tablet Take 10 mg by mouth daily.    . metFORMIN (GLUCOPHAGE-XR) 500 MG 24 hr tablet Take 2,000 mg by mouth daily with breakfast.     . Multiple Vitamin (MULTIVITAMIN WITH MINERALS) TABS tablet Take 1 tablet by mouth daily.    [name] NITROSTAT 0.4 MG SL tablet PLACE 1 TABLET (0.4 MG TOTAL) UNDER THE TONGUE EVERY 5 (FIVE) MINUTES AS NEEDED FOR CHEST PAIN. (Patient taking differently: Place 0.4 mg under the tongue every 5 (five) minutes as needed for chest pain. ) 25 tablet 5  . Omega 3 1200 MG CAPS Take 7,200 mg by mouth daily.       Drug Regimen Review  Drug regimen was reviewed and remains appropriate with no significant issues identified  Home: Home Living Family/patient expects to be discharged to:: Private residence Living Arrangements: Spouse/significant other, Children Available Help at Discharge: Family Type of Home: Apartment Home Access:  Elevator Home Layout: One level Bathroom Shower/Tub: Public librarian, Multimedia programmer: Standard Bathroom Accessibility: Yes Home Equipment: Environmental consultant - 2 wheels, Cane - single point Additional Comments: wife 38 years old and assisting with her 58 at ALF in [place]; wife to have hip replacement end of June  Lives With: Spouse, Son   Functional History: Prior Function Level of Independence: Independent Comments: 62 year old son with Aspergers; wife 67 years old with hip replacement surgery end of June  Functional Status:  Mobility: Bed Mobility Overal bed mobility: Modified Independent Bed Mobility: Supine to Sit Supine to sit: Supervision Sit to supine: Supervision General bed mobility comments: HOB elevated and use of rail  Transfers Overall transfer level: Needs assistance Equipment used: None Transfers: Sit to/from Stand Sit to Stand: Min assist Stand pivot transfers: Min assist General transfer comment: assist to steady from EOB and recliner Ambulation/Gait Ambulation/Gait assistance: Min assist, Mod assist  Gait Distance (Feet):  (90 ft X 2 trials with seated break) Assistive device: Straight cane (assist at trunk with gait belt) Gait Pattern/deviations: Decreased stride length, Step-through pattern, Decreased step length - right, Drifts right/left General Gait Details: pt with varying gait speeds and often moving too quickly givne deficits; pt with anterior bias and cues required for decreased gait speed, uprigh posture/forward gaze, and sequencing; required assistance for balance  Gait velocity: decr    ADL: ADL Overall ADL's : Needs assistance/impaired Grooming: Oral care, Minimal assistance, Standing Grooming Details (indicate cue type and reason): min assist to manage items at sink, at times impaired balance with R knee buckling and required min-mod assist for balance  Upper Body Bathing: Minimal assistance, Sitting Lower Body Bathing: Sit to/from  stand, Moderate assistance Lower Body Bathing Details (indicate cue type and reason): figure 4 technique for feet, poor balance dynamically, min assist sit to stand  Upper Body Dressing : Moderate assistance, Sitting Upper Body Dressing Details (indicate cue type and reason): to don new gown  Lower Body Dressing: Maximal assistance, Sit to/from stand Lower Body Dressing Details (indicate cue type and reason): figure 4 technique to adjust socks, relies on UE and external support in standing; min assist for sit to stand  Toilet Transfer: Minimal assistance, Ambulation Toilet Transfer Details (indicate cue type and reason): simulated in room Toileting - Clothing Manipulation Details (indicate cue type and reason): incontinent of bladder at sink, reports normally wearing briefs at home; total assist to clean up Functional mobility during ADLs: Minimal assistance, Cueing for safety, Cueing for sequencing General ADL Comments: pt limited by R sided weakness and coordnation, impaired balance, decreased activity tolerance  Cognition: Cognition Overall Cognitive Status: Within Functional Limits for tasks assessed Arousal/Alertness: Awake/alert Orientation Level: Oriented X4 Attention: Focused Focused Attention: Appears intact Memory: Appears intact Awareness: Appears intact Problem Solving: Appears intact Executive Function: Reasoning Reasoning: Appears intact Safety/Judgment: Appears intact Cognition Arousal/Alertness: Awake/alert Behavior During Therapy: WFL for tasks assessed/performed, Impulsive Overall Cognitive Status: Within Functional Limits for tasks assessed Area of Impairment: Safety/judgement Current Attention Level: Sustained Memory: Decreased short-term memory, Decreased recall of precautions Following Commands: Follows one step commands consistently, Follows one step commands with increased time, Follows multi-step commands consistently Safety/Judgement: Decreased awareness of  safety, Decreased awareness of deficits Awareness: Emergent Problem Solving: Slow processing, Decreased initiation, Difficulty sequencing, Requires verbal cues, Requires tactile cues General Comments: a little impulsive and decreased safety awareness but otherwise [facility] for mobility tasks   Blood pressure 106/68, pulse 67, temperature 98.2 F (36.8 C), temperature source Oral, resp. rate 18, height 5\' 8"  (1.727 m), weight 107.1 kg, SpO2 97 %. Physical Exam  Nursing note and vitals reviewed. Constitutional: He is oriented to person, place, and time. No distress.  HENT:  Head: Normocephalic and atraumatic.  Right Ear: External ear normal.  Left Ear: External ear normal.  Nose: Nose normal.  Eyes: Right eye exhibits no discharge. Left eye exhibits no discharge.  Respiratory: Effort normal. No stridor. No respiratory distress.  GI: Normal appearance. He exhibits no distension.  Musculoskeletal:     Cervical back: Normal range of motion.     Comments: No edema or tenderness in extremities  Neurological: He is alert and oriented to person, place, and time. He displays weakness. A sensory deficit (RUE--not aware of it in space) is present.  Motor: LUE/LLE: 5/5 proximal distal RUE/RLE: 4+-5/5 proximal distal Sensation subjectively diminished to light touch in right hand  Skin: Skin is warm and dry.  Psychiatric: His behavior is normal. Mood normal.    Results for orders placed or performed during the hospital encounter of 04/14/20 (from the past 48 hour(s))  Glucose, capillary     Status: Abnormal   Collection Time: 04/16/20  4:26 PM  Result Value Ref Range   Glucose-Capillary 172 (H) 70 - 99 mg/dL    Comment: Glucose reference range applies only to samples taken after fasting for at least 8 hours.  Glucose, capillary     Status: Abnormal   Collection Time: 04/16/20  9:10 PM  Result Value Ref Range   Glucose-Capillary 178 (H) 70 - 99 mg/dL    Comment: Glucose reference range applies  only to samples taken after fasting for at least 8 hours.   Comment 1 Notify RN    Comment 2 Document in Chart   Glucose, capillary     Status: Abnormal   Collection Time: 04/17/20  6:07 AM  Result Value Ref Range   Glucose-Capillary 138 (H) 70 - 99 mg/dL    Comment: Glucose reference range applies only to samples taken after fasting for at least 8 hours.   Comment 1 Notify RN    Comment 2 Document in Chart   Glucose, capillary     Status: Abnormal   Collection Time: 04/17/20  8:29 AM  Result Value Ref Range   Glucose-Capillary 119 (H) 70 - 99 mg/dL    Comment: Glucose reference range applies only to samples taken after fasting for at least 8 hours.  Glucose, capillary     Status: Abnormal   Collection Time: 04/17/20 11:36 AM  Result Value Ref Range   Glucose-Capillary 180 (H) 70 - 99 mg/dL    Comment: Glucose reference range applies only to samples taken after fasting for at least 8 hours.  Glucose, capillary     Status: Abnormal   Collection Time: 04/17/20  5:33 PM  Result Value Ref Range   Glucose-Capillary 172 (H) 70 - 99 mg/dL    Comment: Glucose reference range applies only to samples taken after fasting for at least 8 hours.  Glucose, capillary     Status: Abnormal   Collection Time: 04/17/20  9:22 PM  Result Value Ref Range   Glucose-Capillary 166 (H) 70 - 99 mg/dL    Comment: Glucose reference range applies only to samples taken after fasting for at least 8 hours.  Glucose, capillary     Status: Abnormal   Collection Time: 04/18/20  6:16 AM  Result Value Ref Range   Glucose-Capillary 129 (H) 70 - 99 mg/dL    Comment: Glucose reference range applies only to samples taken after fasting for at least 8 hours.  Glucose, capillary     Status: Abnormal   Collection Time: 04/18/20 11:37 AM  Result Value Ref Range   Glucose-Capillary 184 (H) 70 - 99 mg/dL    Comment: Glucose reference range applies only to samples taken after fasting for at least 8 hours.   No results  found.     Medical Problem List and Plan: 1.  Deficits with mobility, endurance, balance, self-care secondary to acute left thalamic infarct.  -patient may shower  -ELOS/Goals: 6-9 days/Supervision/Mod I  Admit to CIR 2.  Antithrombotics: -DVT/anticoagulation:  Pharmaceutical: Lovenox  -antiplatelet therapy: DAPT 3. Pain Management: N/A 4. Mood: LCSW to follow for evaluation and support.   -antipsychotic agents: N/A 5. Neuropsych: This patient is capable of making decisions on his own behalf. 6. Skin/Wound Care: routine pressure relief measures.  7. Fluids/Electrolytes/Nutrition:  Monitor I/O.  CMP ordered. 8. HTN: Monitor BP tid--off lisinopril at this time.   Monitor with increased mobility and with medications as necessary. 9.T2DM: Well controlled --Hgb A1C- 7.9. Will d/c Lantus--resume metformin as patient not interested in insulin. Will consult RD to educate on carb counting.  Monitor with increased mobility. 10. Dyslipidemia: Continue Lipitor. 11. ABLA: CBC ordered. 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  49. Morbid obesity: Encouraged weight loss  Bary Leriche, PA-C 04/18/2020  I have personally performed a face to face diagnostic evaluation, including, but not limited to relevant history and physical exam findings, of this patient and developed relevant assessment and plan.  Additionally, I have reviewed and concur with the physician assistant's documentation above.  Delice Lesch, MD, ABPMR  The patient's status has not changed. The original post admission physician evaluation remains appropriate, and any changes from the pre-admission screening or documentation from the acute chart are noted above.   Delice Lesch, MD, ABPMR

## 2020-04-18 NOTE — Progress Notes (Addendum)
Inpatient Rehabilitation Admissions Coordinator  I have CIR bed available today and met with patient at bedside. He is in agreement to admit today. I have notified Dr. Cyndia Skeeters, [facility] team and will make the arrangements to admit today.  Danne Baxter, RN, MSN Rehab Admissions Coordinator [phone] 04/18/2020 11:50 AM

## 2020-04-18 NOTE — Progress Notes (Signed)
Jordan Arn, MD  Physician  Physical Medicine and Rehabilitation  Consult Note      Signed  Date of Service:  04/16/2020  8:17 AM      Related encounter: ED to Hosp-Admission (Current) from 04/14/2020 in [facility] [place] [facility]      Signed      Expand All Collapse All  Show:Clear all [x] Manual[x] Template[] Copied  Added by: [x] Love, Ivan Anchors, PA-C[x] Jordan Arn, MD  [] Hover for details      Physical Medicine and Rehabilitation Consult     Reason for Consult: Stroke with functional deficits.  Referring Physician: Dr. Cyndia Skeeters     HPI: Jordan [name] is a 79 y.o. 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  BP noted to be elevated on admission.  CT head unremarkable for acute intracranial process.  CTA head/neck negative for LVO or high grade stenosis.  Echocardiogram with ejection fraction of 65-70 percent with grade II DD and elevated left atrial pressure.  UDS negative. 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 Dr. Erlinda Hong felt that stroke secondary to small vessel disease and ASA added to Plavix with recommendations to d/c ASA after 3 weeks. Therapy evaluations revealed balance deficits with poor safety awareness.  Hospital course was complicated by acute blood loss anemia.  CIR recommended due to functional decline.    Review of Systems  Constitutional: Negative for chills and fever.  HENT: Positive for tinnitus.   Eyes: Negative for blurred vision and double vision.  Respiratory: Positive for shortness of breath.   Cardiovascular: Negative for chest pain and palpitations.  Gastrointestinal: Positive for diarrhea and heartburn. Negative for nausea.  Genitourinary: Negative for dysuria and urgency.  Musculoskeletal: Positive for back pain (chronic--goes to get adjustment) and joint pain (occasional hip pain). Negative for myalgias  and neck pain.       BLE radiculopathy  Skin: Negative for itching and rash.  Neurological: Positive for dizziness (with quick movements) and sensory change (right). Negative for speech change, focal weakness and headaches.  Psychiatric/Behavioral: The patient does not have insomnia.   All other systems reviewed and are negative.         Past Medical History:  Diagnosis Date  . Arthritis      R shoulder, bone spur  . Arthritis      "hands" (03/25/2016)  . Chronic lower back pain    . Coronary heart disease      Dr Pernell Dupre  . H/O cardiovascular stress test      perhaps last one was 2009  . Heart murmur      12/29/11 echo: mild MR, no AS, trivial TR  . History of gout    . Hyperlipidemia    . Hypertension      saw Dr. Linard Millers last earl;y- 2014, cardiac cath. last done ?2009, blocks seen didn't require any intervention at that point.  . Narcolepsy      [name] 04-11-97; Mean Latency 1.60min, SOREM 2  . OSA on CPAP      NPSG 12-13-98 AHI 22.7  . PONV (postoperative nausea and vomiting)    . Presence of permanent cardiac pacemaker    . Shortness of breath      with exertion   . Stroke ([place])    . Type II diabetes mellitus (HCC)             Past  Surgical History:  Procedure Laterality Date  . CARDIAC CATHETERIZATION   2009?  . EP IMPLANTABLE DEVICE N/A 03/25/2016    Procedure: Pacemaker Implant - Dual Chamber;  Surgeon: Evans Lance, MD;  Location: [facility];  Service: Cardiovascular;  Laterality: N/A;  . FRACTURE SURGERY      . INSERT / REPLACE / REMOVE PACEMAKER   03/24/2016  . LEFT AND RIGHT HEART CATHETERIZATION WITH CORONARY ANGIOGRAM N/A 06/05/2014    Procedure: LEFT AND RIGHT HEART CATHETERIZATION WITH CORONARY ANGIOGRAM;  Surgeon: Sinclair Grooms, MD;  Location: [facility] CATH LAB;  Service: Cardiovascular;  Laterality: N/A;  . NASAL FRACTURE SURGERY      . SHOULDER ARTHROSCOPY WITH ROTATOR CUFF REPAIR AND SUBACROMIAL DECOMPRESSION Right 08/16/2013    Procedure:  SHOULDER ARTHROSCOPY WITH ROTATOR CUFF REPAIR AND SUBACROMIAL DECOMPRESSION;  Surgeon: [name], MD;  Location: [place];  Service: Orthopedics;  Laterality: Right;  Right shoulder arthroscopy rotator cuff repair, subacromial decompression.  . TONSILLECTOMY               Family History  Problem Relation Age of Onset  . Sleep apnea Sister    . Colon cancer Sister        Social History:  [name] is 79 yrs old and checks on aunt in ALF.  Wife is scheduled to have cataract surgery as well as hip replacement in the near future.  He is retired --used to be an Herbalist. He was independent without AD--goes to the [facility] X wk. [name] He reports that he quit smoking about 52 years ago. His smoking use included cigarettes. He has a 15.00 pack-year smoking history. He has never used smokeless tobacco. He reports current alcohol use--last 3-4 months ago.  He reports that he does not use drugs.      Allergies: No Known Allergies            Medications Prior to Admission  Medication Sig Dispense Refill  . amphetamine-dextroamphetamine (ADDERALL) 10 MG tablet 1-3 tabs daily as needed (Patient taking differently: Take 10 mg by mouth See admin instructions. 1-3 tabs daily as needed) 90 tablet 0  . atorvastatin (LIPITOR) 40 MG tablet Take 20 mg by mouth daily before breakfast.       . clopidogrel (PLAVIX) 75 MG tablet Take 1 tablet (75 mg total) by mouth daily. 30 tablet 1  . glucosamine-chondroitin 500-400 MG tablet Take 2 tablets by mouth daily.      [name] lisinopril (ZESTRIL) 10 MG tablet Take 10 mg by mouth daily.      . metFORMIN (GLUCOPHAGE-XR) 500 MG 24 hr tablet Take 2,000 mg by mouth daily with breakfast.       . Multiple Vitamin (MULTIVITAMIN WITH MINERALS) TABS tablet Take 1 tablet by mouth daily.      [name] NITROSTAT 0.4 MG SL tablet PLACE 1 TABLET (0.4 MG TOTAL) UNDER THE TONGUE EVERY 5 (FIVE) MINUTES AS NEEDED FOR CHEST PAIN. (Patient taking differently: Place 0.4 mg under the  tongue every 5 (five) minutes as needed for chest pain. ) 25 tablet 5  . Omega 3 1200 MG CAPS Take 7,200 mg by mouth daily.           Home: Home Living Family/patient expects to be discharged to:: Private residence Living Arrangements: Spouse/significant other, Children Available Help at Discharge: Family, Available 24 hours/day Type of Home: Apartment Home Access: Elevator (to 10th floor) Home Layout: One level Bathroom Shower/Tub: Tub/shower unit, Holiday representative  Toilet: Standard Home Equipment: Environmental consultant - 2 wheels, Cane - single point Additional Comments: spouse has been taking care of her mother in [place] and has 2 scheduled surgeries in [place] History: Prior Function Level of Independence: Independent Comments: driving, retired; son has Landscape architect Status:  Mobility: Bed Mobility Overal bed mobility: Needs Assistance Bed Mobility: Supine to Sit Supine to sit: Supervision Sit to supine: Supervision General bed mobility comments: for safety and technique with HOB elevated Transfers Overall transfer level: Needs assistance Equipment used: None Transfers: Sit to/from Stand Sit to Stand: Min assist General transfer comment: min assist to power up and steady from EOB Ambulation/Gait Ambulation/Gait assistance: Mod assist Gait Distance (Feet): 75 Feet Assistive device: Rolling walker (2 wheeled) Gait Pattern/deviations: Step-to pattern, Decreased step length - right, Trunk flexed, Narrow base of support, Decreased stride length General Gait Details: decreased step on right, running into things on his right. Gets outside of RW, difficulty keeping Right hand on walker. Decreased safety awareness. R LE buckling occasioanlly with ambulation. Gait velocity: decr   ADL: ADL Overall ADL's : Needs assistance/impaired Grooming: Oral care, Minimal assistance, Standing Grooming Details (indicate cue type and reason): min assist to manage items at sink,  at times impaired balance with R knee buckling and required min-mod assist for balance  Upper Body Bathing: Minimal assistance, Sitting Lower Body Bathing: Sit to/from stand, Moderate assistance Lower Body Bathing Details (indicate cue type and reason): figure 4 technique for feet, poor balance dynamically, min assist sit to stand  Upper Body Dressing : Moderate assistance, Sitting Upper Body Dressing Details (indicate cue type and reason): to don new gown  Lower Body Dressing: Maximal assistance, Sit to/from stand Lower Body Dressing Details (indicate cue type and reason): figure 4 technique to adjust socks, relies on UE and external support in standing; min assist for sit to stand  Toilet Transfer: Minimal assistance, Ambulation Toilet Transfer Details (indicate cue type and reason): simulated in room Toileting - Clothing Manipulation Details (indicate cue type and reason): incontinent of bladder at sink, reports normally wearing briefs at home; total assist to clean up Functional mobility during ADLs: Minimal assistance, Cueing for safety, Cueing for sequencing General ADL Comments: pt limited by R sided weakness and coordnation, impaired balance, decreased activity tolerance   Cognition: Cognition Overall Cognitive Status: Impaired/Different from baseline Orientation Level: Oriented X4 Cognition Arousal/Alertness: Awake/alert Behavior During Therapy: Flat affect Overall Cognitive Status: Impaired/Different from baseline Area of Impairment: Memory, Following commands, Safety/judgement, Awareness, Problem solving, Attention Current Attention Level: Sustained Memory: Decreased short-term memory, Decreased recall of precautions Following Commands: Follows one step commands consistently, Follows one step commands with increased time, Follows multi-step commands consistently Safety/Judgement: Decreased awareness of safety, Decreased awareness of deficits Awareness: Emergent Problem Solving:  Slow processing, Decreased initiation, Difficulty sequencing, Requires verbal cues, Requires tactile cues General Comments: patient requires increased time to process and follow commands, decreased awareness to deficits but improving during session      Blood pressure (!) 172/95, pulse 61, temperature 97.7 F (36.5 C), temperature source Oral, resp. rate 18, height 5\' 8"  (1.727 m), weight 107.1 kg, SpO2 97 %. Physical Exam  Vitals reviewed. Constitutional: He is oriented to person, place, and time.  HENT:  Head: Normocephalic and atraumatic.  Right Ear: External ear normal.  Left Ear: External ear normal.  Nose: Nose normal.  Eyes: Right eye exhibits no discharge. Left eye exhibits no discharge.  Respiratory: Effort normal. No stridor. No respiratory distress.  GI: Soft.  Normal appearance. He exhibits no distension.  Musculoskeletal:     Cervical back: Normal range of motion.     Comments: No edema or tenderness in extremities  Neurological: He is alert and oriented to person, place, and time.  No ataxia bilateral lower extremities Motor: 4+-5/5 throughout, right side?  Slightly weaker  Skin: Skin is warm.  Psychiatric: His behavior is normal. Mood and judgment normal.      Lab Results Last 24 Hours       Results for orders placed or performed during the hospital encounter of 04/14/20 (from the past 24 hour(s))  Glucose, capillary     Status: Abnormal    Collection Time: 04/15/20  1:01 PM  Result Value Ref Range    Glucose-Capillary 149 (H) 70 - 99 mg/dL  Glucose, capillary     Status: Abnormal    Collection Time: 04/15/20  5:39 PM  Result Value Ref Range    Glucose-Capillary 148 (H) 70 - 99 mg/dL  Glucose, capillary     Status: Abnormal    Collection Time: 04/15/20  9:16 PM  Result Value Ref Range    Glucose-Capillary 177 (H) 70 - 99 mg/dL  Glucose, capillary     Status: Abnormal    Collection Time: 04/16/20  6:33 AM  Result Value Ref Range    Glucose-Capillary 129 (H)  70 - 99 mg/dL         Imaging Results (Last 48 hours)  CT ANGIO HEAD W OR WO CONTRAST   Result Date: 04/14/2020 CLINICAL DATA:  Right-sided sensory deficit EXAM: CT ANGIOGRAPHY HEAD AND NECK TECHNIQUE: Multidetector CT imaging of the head and neck was performed using the standard protocol during bolus administration of intravenous contrast. Multiplanar CT image reconstructions and MIPs were obtained to evaluate the vascular anatomy. Carotid stenosis measurements (when applicable) are obtained utilizing NASCET criteria, using the distal internal carotid diameter as the denominator. CONTRAST:  65mL OMNIPAQUE IOHEXOL 350 MG/ML SOLN COMPARISON:  Head CT 04/14/2020 FINDINGS: CTA NECK FINDINGS SKELETON: There is no bony spinal canal stenosis. No lytic or blastic lesion. OTHER NECK: Normal pharynx, larynx and major salivary glands. No cervical lymphadenopathy. Unremarkable thyroid gland. UPPER CHEST: No pneumothorax or pleural effusion. No nodules or masses. AORTIC ARCH: There is mild calcific atherosclerosis of the aortic arch. There is no aneurysm, dissection or hemodynamically significant stenosis of the visualized portion of the aorta. Conventional 3 vessel aortic branching pattern. The visualized proximal subclavian arteries are widely patent. RIGHT CAROTID SYSTEM: No dissection, occlusion or aneurysm. Mild atherosclerotic calcification at the carotid bifurcation without hemodynamically significant stenosis. LEFT CAROTID SYSTEM: No dissection, occlusion or aneurysm. Mild atherosclerotic calcification at the carotid bifurcation without hemodynamically significant stenosis. VERTEBRAL ARTERIES: Codominant configuration. Both origins are clearly patent. There is no dissection, occlusion or flow-limiting stenosis to the skull base (V1-V3 segments). CTA HEAD FINDINGS POSTERIOR CIRCULATION: --Vertebral arteries: Calcific atherosclerosis. --Inferior cerebellar arteries: Normal. --Basilar artery: Normal. --Superior  cerebellar arteries: Normal. --Posterior cerebral arteries (PCA): Normal. ANTERIOR CIRCULATION: --Intracranial internal carotid arteries: Normal. --Anterior cerebral arteries (ACA): Normal. Both A1 segments are present. Patent anterior communicating artery (a-comm). --Middle cerebral arteries (MCA): Normal. VENOUS SINUSES: As permitted by contrast timing, patent. ANATOMIC VARIANTS: None Review of the MIP images confirms the above findings. IMPRESSION: 1. No emergent large vessel occlusion or high-grade stenosis of the intracranial or cervical arteries. 2. Aortic Atherosclerosis (ICD10-I70.0). Electronically Signed   By: Ulyses Jarred M.D.   On: 04/14/2020 21:54    CT ANGIO NECK W OR  WO CONTRAST   Result Date: 04/14/2020 CLINICAL DATA:  Right-sided sensory deficit EXAM: CT ANGIOGRAPHY HEAD AND NECK TECHNIQUE: Multidetector CT imaging of the head and neck was performed using the standard protocol during bolus administration of intravenous contrast. Multiplanar CT image reconstructions and MIPs were obtained to evaluate the vascular anatomy. Carotid stenosis measurements (when applicable) are obtained utilizing NASCET criteria, using the distal internal carotid diameter as the denominator. CONTRAST:  15mL OMNIPAQUE IOHEXOL 350 MG/ML SOLN COMPARISON:  Head CT 04/14/2020 FINDINGS: CTA NECK FINDINGS SKELETON: There is no bony spinal canal stenosis. No lytic or blastic lesion. OTHER NECK: Normal pharynx, larynx and major salivary glands. No cervical lymphadenopathy. Unremarkable thyroid gland. UPPER CHEST: No pneumothorax or pleural effusion. No nodules or masses. AORTIC ARCH: There is mild calcific atherosclerosis of the aortic arch. There is no aneurysm, dissection or hemodynamically significant stenosis of the visualized portion of the aorta. Conventional 3 vessel aortic branching pattern. The visualized proximal subclavian arteries are widely patent. RIGHT CAROTID SYSTEM: No dissection, occlusion or aneurysm. Mild  atherosclerotic calcification at the carotid bifurcation without hemodynamically significant stenosis. LEFT CAROTID SYSTEM: No dissection, occlusion or aneurysm. Mild atherosclerotic calcification at the carotid bifurcation without hemodynamically significant stenosis. VERTEBRAL ARTERIES: Codominant configuration. Both origins are clearly patent. There is no dissection, occlusion or flow-limiting stenosis to the skull base (V1-V3 segments). CTA HEAD FINDINGS POSTERIOR CIRCULATION: --Vertebral arteries: Calcific atherosclerosis. --Inferior cerebellar arteries: Normal. --Basilar artery: Normal. --Superior cerebellar arteries: Normal. --Posterior cerebral arteries (PCA): Normal. ANTERIOR CIRCULATION: --Intracranial internal carotid arteries: Normal. --Anterior cerebral arteries (ACA): Normal. Both A1 segments are present. Patent anterior communicating artery (a-comm). --Middle cerebral arteries (MCA): Normal. VENOUS SINUSES: As permitted by contrast timing, patent. ANATOMIC VARIANTS: None Review of the MIP images confirms the above findings. IMPRESSION: 1. No emergent large vessel occlusion or high-grade stenosis of the intracranial or cervical arteries. 2. Aortic Atherosclerosis (ICD10-I70.0). Electronically Signed   By: Ulyses Jarred M.D.   On: 04/14/2020 21:54    MR BRAIN WO CONTRAST   Result Date: 04/15/2020 CLINICAL DATA:  TIA. Right-sided numbness and tingling. History of stroke. EXAM: MRI HEAD WITHOUT CONTRAST TECHNIQUE: Multiplanar, multiecho pulse sequences of the brain and surrounding structures were obtained without intravenous contrast. COMPARISON:  CT head 04/14/2020 FINDINGS: Brain: Acute infarct left thalamus with patchy areas of restricted diffusion. No other acute infarct. Generalized atrophy. Chronic microvascular ischemic changes in the white matter. Chronic infarct left frontal lobe anteriorly. Small chronic infarcts in the cerebellum bilaterally. Chronic infarct right thalamus. Negative for  hemorrhage or mass. No hydrocephalus. Vascular: Normal arterial flow voids. Skull and upper cervical spine: No focal skeletal lesion. Cervical degenerative changes. Sinuses/Orbits: Mucosal edema paranasal sinuses most notably in the left frontal sinus, left ethmoid and maxillary sinuses. No orbital lesion.  Bilateral cataract extraction. Other: None IMPRESSION: Acute infarct left thalamus Atrophy and chronic ischemic changes as above. Electronically Signed   By: Franchot Gallo M.D.   On: 04/15/2020 13:33    ECHOCARDIOGRAM COMPLETE   Result Date: 04/15/2020    ECHOCARDIOGRAM REPORT   Patient Name:   ZAIYDEN STROZIER Date of Exam: 04/15/2020 Medical Rec #:  [id number]       Height:       68.0 in Accession #:    [id number]      Weight:       236.1 lb Date of Birth:  1941/10/03       BSA:          2.193 m Patient Age:  79 years        BP:           112/63 mmHg Patient Gender: M               HR:           55 bpm. Exam Location:  Inpatient Procedure: 2D Echo, Cardiac Doppler and Color Doppler Indications:    TIA  History:        Patient has prior history of Echocardiogram examinations, most                 recent 02/17/2018. CAD, Pacemaker and Abnormal ECG, TIA and                 Stroke, Signs/Symptoms:Dyspnea; Risk Factors:Sleep Apnea,                 Hypertension, Dyslipidemia and Diabetes.  Sonographer:    Roseanna Rainbow RDCS Referring Phys: [id number] JENNIFER YATES  Sonographer Comments: Technically difficult study due to poor echo windows and patient is morbidly obese. Image acquisition challenging due to patient body habitus. IMPRESSIONS  1. Left ventricular ejection fraction, by estimation, is 65 to 70%. The left ventricle has normal function. The left ventricle has no regional wall motion abnormalities. There is mild concentric left ventricular hypertrophy. Left ventricular diastolic parameters are consistent with Grade II diastolic dysfunction (pseudonormalization). Elevated left atrial pressure.  2. Right ventricular  systolic function is normal. The right ventricular size is normal. There is mildly elevated pulmonary artery systolic pressure. The estimated right ventricular systolic pressure is 74.0 mmHg.  3. Left atrial size was mildly dilated.  4. The mitral valve is normal in structure. No evidence of mitral valve regurgitation. No evidence of mitral stenosis.  5. The aortic valve is normal in structure. Aortic valve regurgitation is mild. No aortic stenosis is present.  6. The inferior vena cava is dilated in size with <50% respiratory variability, suggesting right atrial pressure of 15 mmHg. FINDINGS  Left Ventricle: Left ventricular ejection fraction, by estimation, is 65 to 70%. The left ventricle has normal function. The left ventricle has no regional wall motion abnormalities. The left ventricular internal cavity size was normal in size. There is  mild concentric left ventricular hypertrophy. Left ventricular diastolic parameters are consistent with Grade II diastolic dysfunction (pseudonormalization). Elevated left atrial pressure. Right Ventricle: The right ventricular size is normal. No increase in right ventricular wall thickness. Right ventricular systolic function is normal. There is mildly elevated pulmonary artery systolic pressure. The tricuspid regurgitant velocity is 1.98  m/s, and with an assumed right atrial pressure of 15 mmHg, the estimated right ventricular systolic pressure is 81.4 mmHg. Left Atrium: Left atrial size was mildly dilated. Right Atrium: Right atrial size was normal in size. Pericardium: There is no evidence of pericardial effusion. Mitral Valve: The mitral valve is normal in structure. Normal mobility of the mitral valve leaflets. Mild mitral annular calcification. No evidence of mitral valve regurgitation. No evidence of mitral valve stenosis. Tricuspid Valve: The tricuspid valve is normal in structure. Tricuspid valve regurgitation is not demonstrated. No evidence of tricuspid stenosis.  Aortic Valve: The aortic valve is normal in structure. Aortic valve regurgitation is mild. No aortic stenosis is present. Pulmonic Valve: The pulmonic valve was normal in structure. Pulmonic valve regurgitation is not visualized. No evidence of pulmonic stenosis. Aorta: The aortic root is normal in size and structure. Venous: The inferior vena cava is dilated in size with less than 50% respiratory  variability, suggesting right atrial pressure of 15 mmHg. IAS/Shunts: No atrial level shunt detected by color flow Doppler. Additional Comments: A pacer wire is visualized.  LEFT VENTRICLE PLAX 2D LVIDd:         4.80 cm     Diastology LVIDs:         3.30 cm     LV e' lateral:   7.40 cm/s LV PW:         1.20 cm     LV E/e' lateral: 17.5 LV IVS:        1.40 cm     LV e' medial:    5.00 cm/s LVOT diam:     2.00 cm     LV E/e' medial:  25.9 LV SV:         67 LV SV Index:   31 LVOT Area:     3.14 cm  LV Volumes (MOD) LV vol d, MOD A2C: 75.7 ml LV vol d, MOD A4C: 96.6 ml LV vol s, MOD A2C: 20.7 ml LV vol s, MOD A4C: 25.2 ml LV SV MOD A2C:     55.0 ml LV SV MOD A4C:     96.6 ml LV SV MOD BP:      62.2 ml RIGHT VENTRICLE            IVC RV S prime:     6.74 cm/s  IVC diam: 2.60 cm TAPSE (M-mode): 1.4 cm LEFT ATRIUM             Index       RIGHT ATRIUM           Index LA diam:        3.90 cm 1.78 cm/m  RA Area:     12.10 cm LA Vol (A2C):   24.8 ml 11.31 ml/m RA Volume:   21.20 ml  9.67 ml/m LA Vol (A4C):   39.8 ml 18.15 ml/m LA Biplane Vol: 32.0 ml 14.59 ml/m  AORTIC VALVE LVOT Vmax:   131.00 cm/s LVOT Vmean:  98.300 cm/s LVOT VTI:    0.213 m  AORTA Ao Root diam: 3.90 cm Ao Asc diam:  3.30 cm MITRAL VALVE                TRICUSPID VALVE MV Area (PHT): 3.72 cm     TR Peak grad:   15.7 mmHg MV Decel Time: 204 msec     TR Vmax:        198.00 cm/s MV E velocity: 129.33 cm/s MV A velocity: 106.00 cm/s  SHUNTS MV E/A ratio:  1.22         Systemic VTI:  0.21 m                             Systemic Diam: 2.00 cm Dani Gobble Croitoru MD  Electronically signed by Sanda Klein MD Signature Date/Time: 04/15/2020/11:06:17 AM    Final     CT HEAD CODE STROKE WO CONTRAST   Result Date: 04/14/2020 CLINICAL DATA:  Code stroke.  Right arm weakness. EXAM: CT HEAD WITHOUT CONTRAST TECHNIQUE: Contiguous axial images were obtained from the base of the skull through the vertex without intravenous contrast. COMPARISON:  Head CT 02/16/2018 and MRI 02/17/2018 FINDINGS: Brain: There is no evidence of acute infarct, intracranial hemorrhage, mass, midline shift, or extra-axial fluid collection. Small chronic infarcts in the anterior left frontal lobe, medial right occipital lobe, and cerebellum are unchanged. Hypodensities in the  cerebral white matter bilaterally are unchanged and nonspecific but compatible with mild chronic small vessel ischemic disease. There is mild cerebral atrophy. Vascular: Calcified atherosclerosis at the skull base. No hyperdense vessel. Skull: No fracture or suspicious osseous lesion. Sinuses/Orbits: New complete opacification of the left frontal sinus and left anterior ethmoid air cells. Near complete opacification of the included portion of the left maxillary sinus with osseous sinus wall thickening. Clear mastoid air cells. Bilateral cataract extraction. Other: None. ASPECTS [facility] Stroke Program Early CT Score) - Ganglionic level infarction (caudate, lentiform nuclei, internal capsule, insula, M1-M3 cortex): 7 - Supraganglionic infarction (M4-M6 cortex): 3 Total score (0-10 with 10 being normal): 10 IMPRESSION: 1. No evidence of acute intracranial abnormality. 2. ASPECTS is 10. 3. 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 4. Chronic appearing left-sided sinusitis in an ostiomeatal unit obstruction pattern. These results were called by telephone at the time of interpretation on 04/14/2020 at 12:55 pm to Dr. Orlena Sheldon, who verbally acknowledged these results. Electronically Signed   By: Logan Bores M.D.    On: 04/14/2020 12:56       Assessment/Plan: Diagnosis: Left thalamic CVA. Stroke: Continue secondary stroke prophylaxis and Risk Factor Modification listed below:   Antiplatelet therapy:   Blood Pressure Management:  Continue current medication with prn's with permisive HTN per primary team Statin Agent:   Diabetes management:   Labs independently reviewed.  Records reviewed and summated above.   1. Does the need for close, 24 hr/day medical supervision in concert with the patient's rehab needs make it unreasonable for this patient to be served in a less intensive setting? Yes  2. Co-Morbidities requiring supervision/potential complications: CAD, T2 DM (Monitor in accordance with exercise and adjust meds as necessary), OSA-on CPAP (monitor for daytime somnolence and lethargy), narcolepsy, SSS s/p PPM, ABLA (repeat labs, consider transfusion if necessary to ensure appropriate perfusion for increased activity tolerance), morbid obesity (encourage weight loss) 3. Due to safety, disease management and patient education, does the patient require 24 hr/day rehab nursing? Yes 4. Does the patient require coordinated care of a physician, rehab nurse, therapy disciplines of PT/OT to address physical and functional deficits in the context of the above medical diagnosis(es)? Yes Addressing deficits in the following areas: balance, endurance, locomotion, strength, transferring, bathing, dressing, toileting and psychosocial support 5. Can the patient actively participate in an intensive therapy program of at least 3 hrs of therapy per day at least 5 days per week? Yes 6. The potential for patient to make measurable gains while on inpatient rehab is excellent 7. Anticipated functional outcomes upon discharge from inpatient rehab are modified independent and supervision  with PT, modified independent and supervision with OT, n/a with SLP. 8. Estimated rehab length of stay to reach the above functional goals  is: 5-9 days. 9. Anticipated discharge destination: Home 10. Overall Rehab/Functional Prognosis: good   RECOMMENDATIONS: This patient's condition is appropriate for continued rehabilitative care in the following setting: CIR patient does not progress to supervision level of functioning. Patient has agreed to participate in recommended program. Yes Note that insurance prior authorization may be required for reimbursement for recommended care.   Comment: Rehab Admissions Coordinator to follow up.   I have personally performed a face to face diagnostic evaluation, including, but not limited to relevant history and physical exam findings, of this patient and developed relevant assessment and plan.  Additionally, I have reviewed and concur with the physician assistant's documentation above.    Delice Lesch, MD,  ABPMR Bary Leriche, PA-C 04/16/2020        Revision History                          Routing History           Note Details  Author Posey Pronto, Domenick Bookbinder, MD File Time 04/16/2020  1:59 PM  Author Type Physician Status Signed  Last Editor Jordan Arn, MD Service Physical Medicine and Rehabilitation

## 2020-04-18 NOTE — Progress Notes (Signed)
Jamse Arn, MD  Physician  Physical Medicine and Rehabilitation  PMR Pre-admission      Addendum  Date of Service:  04/16/2020  2:45 PM      Related encounter: ED to Hosp-Admission (Current) from 04/14/2020 in [place]       Show:Clear all [x] Manual[x] Template[x] Copied  Added by: [x] Cristina Gong, RN  [] Hover for details PMR Admission Coordinator Pre-Admission Assessment   Patient: Jordan [name] is an 79 y.o., male MRN: [id number] DOB: Mar 09, 1941 Height: 5\' 8"  (172.7 cm) Weight: 107.1 kg                                                                                                                                                  Insurance Information   PRIMARY: [employer]      Policy#: [id number]      Subscriber: pt CM Name: Lattie Haw   Phone#: [phone] ext [phone]     KYH#:[phone] Pre-Cert#: [id number] approved for 7 days f/u with Jeanette Caprice ext [phone] same fax      Employer:  Benefits:  Phone #: [phone]     Name: 6/16 Eff. Date: 11/02/2019     Deduct: none      Out of Pocket Max: $3900      Life Max: none  CIR: $295 co pay per day days 1 until 6      SNF: no copay days 1 until 20; $184 co pay per day days 21 until 100 Outpatient: $10 to $40 per visit    Co-Pay: visits per medical neccesity Home Health: 100%      Co-Pay: visits per medical neccesity DME: 80%     Co-Pay: 20% Providers: in network  SECONDARY: none      Policy#:       Phone#:    Development worker, community:      Phone#:    The Engineer, petroleum" for patients in Inpatient Rehabilitation Facilities with attached "Privacy Act [place] Records" was provided and verbally reviewed with: Patient   Emergency Contact Information         Contact Information     Name Relation Home Work Mobile    [place] Spouse [phone]   [phone]       Current Medical History  Patient Admitting Diagnosis:  CVA   History of Present Illness: 79  y.o. RH-male with history of CAD, T2DM, hypertension OSA-on CPAP, narcolepsy, SSS s/p PPM; who was admitted on 04/14/2020 with right-sided numbness and tingling.  BP noted to be elevated on admission at 204/116 upon arrival.  CT head unremarkable for acute intracranial process.  CTA head/neck negative for LVO or high grade stenosis.  Echocardiogram with ejection fraction of 65-70 percent with grade II DD and elevated left atrial pressure.  UDS negative. 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  Dr. Erlinda Hong felt that stroke secondary to small vessel disease and ASA added to Plavix with recommendations to d/c ASA after 3 weeks. Pacemaker interrogated with no afib noted.Therapy evaluations revealed balance deficits with poor safety awareness. Lovenox for VTE prophylaxis. Hospital course was complicated by acute blood loss anemia.   Complete NIHSS TOTAL: 1 Past Medical History      Past Medical History:  Diagnosis Date  . Arthritis      R shoulder, bone spur  . Arthritis      "hands" (03/25/2016)  . Chronic lower back pain    . Coronary heart disease      Dr Pernell Dupre  . H/O cardiovascular stress test      perhaps last one was 2009  . Heart murmur      12/29/11 echo: mild MR, no AS, trivial TR  . History of gout    . Hyperlipidemia    . Hypertension      saw Dr. Linard Millers last earl;y- 2014, cardiac cath. last done ?2009, blocks seen didn't require any intervention at that point.  . Narcolepsy      [name] 04-11-97; Mean Latency 1.32min, SOREM 2  . OSA on CPAP      NPSG 12-13-98 AHI 22.7  . PONV (postoperative nausea and vomiting)    . Presence of permanent cardiac pacemaker    . Shortness of breath      with exertion   . Stroke ([place])    . Type II diabetes mellitus (HCC)      Family History  family history includes Colon cancer in his sister; Sleep apnea in his sister.   Prior Rehab/Hospitalizations:  Has the patient had prior rehab or hospitalizations prior to  admission? Yes   Has the patient had major surgery during 100 days prior to admission? No   Current Medications    Current Facility-Administered Medications:  .  acetaminophen (TYLENOL) tablet 650 mg, 650 mg, Oral, Q4H PRN **OR** acetaminophen (TYLENOL) 160 MG/5ML solution 650 mg, 650 mg, Per Tube, Q4H PRN **OR** acetaminophen (TYLENOL) suppository 650 mg, 650 mg, Rectal, Q4H PRN, Karmen Bongo, MD .  aspirin EC tablet 81 mg, 81 mg, Oral, Daily, Rosalin Hawking, MD, 81 mg at 04/18/20 0929 .  atorvastatin (LIPITOR) tablet 40 mg, 40 mg, Oral, QAC breakfast, Karmen Bongo, MD, 40 mg at 04/18/20 0800 .  clopidogrel (PLAVIX) tablet 75 mg, 75 mg, Oral, Daily, Karmen Bongo, MD, 75 mg at 04/18/20 0929 .  enoxaparin (LOVENOX) injection 40 mg, 40 mg, Subcutaneous, Q24H, Karmen Bongo, MD, 40 mg at 04/17/20 1822 .  insulin aspart (novoLOG) injection 0-15 Units, 0-15 Units, Subcutaneous, TID WC, Karmen Bongo, MD, 3 Units at 04/18/20 1155 .  insulin aspart (novoLOG) injection 0-5 Units, 0-5 Units, Subcutaneous, QHS, Karmen Bongo, MD .  insulin glargine (LANTUS) injection 5 Units, 5 Units, Subcutaneous, BID, Mercy Riding, MD, 5 Units at 04/18/20 0929 .  senna-docusate (Senokot-S) tablet 1 tablet, 1 tablet, Oral, QHS PRN, Karmen Bongo, MD   Patients Current Diet:     Diet Order                      Diet - low sodium heart healthy              Diet Carb Modified              Diet heart healthy/carb modified Room service appropriate? Yes; Fluid consistency: Thin  Diet effective ____  Precautions / Restrictions Precautions Precautions: Fall Precaution Comments: R sided inattention  Restrictions Weight Bearing Restrictions: No    Has the patient had 2 or more falls or a fall with injury in the past year?No   Prior Activity Level Community (5-7x/wk): Independent; driving   Prior Functional Level Prior Function Level of Independence: Independent Comments: 63  year old son with Aspergers; wife 22 years old with hip replacement surgery end of June   Self Care: Did the patient need help bathing, dressing, using the toilet or eating?  Independent   Indoor Mobility: Did the patient need assistance with walking from room to room (with or without device)? Independent   Stairs: Did the patient need assistance with internal or external stairs (with or without device)? Independent   Functional Cognition: Did the patient need help planning regular tasks such as shopping or remembering to take medications? Independent   Home Assistive Devices / Equipment Home Assistive Devices/Equipment: None Home Equipment: Walker - 2 wheels, Cane - single point   Prior Device Use: Indicate devices/aids used by the patient prior to current illness, exacerbation or injury? None of the above   Current Functional Level Cognition   Arousal/Alertness: Awake/alert Overall Cognitive Status: Within Functional Limits for tasks assessed Current Attention Level: Sustained Orientation Level: Oriented X4 Following Commands: Follows one step commands consistently, Follows one step commands with increased time, Follows multi-step commands consistently Safety/Judgement: Decreased awareness of safety, Decreased awareness of deficits General Comments: a little impulsive and decreased safety awareness but otherwise [facility] for mobility tasks Attention: Focused Focused Attention: Appears intact Memory: Appears intact Awareness: Appears intact Problem Solving: Appears intact Executive Function: Reasoning Reasoning: Appears intact Safety/Judgment: Appears intact    Extremity Assessment (includes Sensation/Coordination)   Upper Extremity Assessment: RUE deficits/detail RUE Deficits / Details: grossly 3/5 MMT, decreased sensation, coordination and functionally dropping items at sink  RUE Sensation: decreased light touch, decreased proprioception RUE Coordination: decreased fine motor,  decreased gross motor  Lower Extremity Assessment: Defer to PT evaluation RLE Coordination: decreased gross motor     ADLs   Overall ADL's : Needs assistance/impaired Grooming: Oral care, Minimal assistance, Standing Grooming Details (indicate cue type and reason): min assist to manage items at sink, at times impaired balance with R knee buckling and required min-mod assist for balance  Upper Body Bathing: Minimal assistance, Sitting Lower Body Bathing: Sit to/from stand, Moderate assistance Lower Body Bathing Details (indicate cue type and reason): figure 4 technique for feet, poor balance dynamically, min assist sit to stand  Upper Body Dressing : Moderate assistance, Sitting Upper Body Dressing Details (indicate cue type and reason): to don new gown  Lower Body Dressing: Maximal assistance, Sit to/from stand Lower Body Dressing Details (indicate cue type and reason): figure 4 technique to adjust socks, relies on UE and external support in standing; min assist for sit to stand  Toilet Transfer: Minimal assistance, Ambulation Toilet Transfer Details (indicate cue type and reason): simulated in room Toileting - Clothing Manipulation Details (indicate cue type and reason): incontinent of bladder at sink, reports normally wearing briefs at home; total assist to clean up Functional mobility during ADLs: Minimal assistance, Cueing for safety, Cueing for sequencing General ADL Comments: pt limited by R sided weakness and coordnation, impaired balance, decreased activity tolerance     Mobility   Overal bed mobility: Modified Independent Bed Mobility: Supine to Sit Supine to sit: Supervision Sit to supine: Supervision General bed mobility comments: HOB elevated and use of rail  Transfers   Overall transfer level: Needs assistance Equipment used: None Transfers: Sit to/from Stand Sit to Stand: Min assist Stand pivot transfers: Min assist General transfer comment: assist to steady from  EOB and recliner     Ambulation / Gait / Stairs / Wheelchair Mobility   Ambulation/Gait Ambulation/Gait assistance: Min assist, Mod assist Gait Distance (Feet):  (90 ft X 2 trials with seated break) Assistive device: Straight cane (assist at trunk with gait belt) Gait Pattern/deviations: Decreased stride length, Step-through pattern, Decreased step length - right, Drifts right/left General Gait Details: pt with varying gait speeds and often moving too quickly givne deficits; pt with anterior bias and cues required for decreased gait speed, uprigh posture/forward gaze, and sequencing; required assistance for balance  Gait velocity: decr     Posture / Balance Balance Overall balance assessment: Needs assistance Sitting-balance support: Feet supported Sitting balance-Leahy Scale: Good Standing balance support: Single extremity supported, During functional activity Standing balance-Leahy Scale: Poor Standing balance comment: relies on external support     Special needs/care consideration CPAP pta Hgb A1c 7.9 Designated visitors are [employer] and [employer]    Previous Home Environment  Living Arrangements: Spouse/significant other, Children  Lives With: Spouse, Son Available Help at Discharge: Family Type of Home: Apartment Home Layout: One level Home Access: Building control surveyor Shower/Tub: Public librarian, Multimedia programmer: Standard Bathroom Accessibility: Yes How Accessible: Accessible via walker Home Care Services: No Additional Comments: wife 42 years old and assisting with her Aunt at ALF in [place]; wife to have hip replacement end of June   Discharge Living Setting Plans for Discharge Living Setting: Patient's home, Apartment, Lives with (comment) (wife and 31 year old son with aspergers) Type of Home at Discharge: Apartment Discharge Home Layout: One level Discharge Home Access: Elevator, Level entry Discharge Bathroom Shower/Tub: Tub/shower unit, Walk-in  shower Discharge Bathroom Toilet: Standard Discharge Bathroom Accessibility: Yes How Accessible: Accessible via walker Does the patient have any problems obtaining your medications?: No   Social/Family/Support Systems Patient Roles: Spouse, Parent Contact Information: wife, [name] Anticipated Caregiver: wife and 76 year old son with aspergers Anticipated Caregiver's Contact Information: see above Ability/Limitations of Caregiver: wife to have hip surgery end of June; 63 year old son has aspergers Caregiver Availability: 24/7 Discharge Plan Discussed with Primary Caregiver: Yes Is Caregiver In Agreement with Plan?: Yes Does Caregiver/Family have Issues with Lodging/Transportation while Pt is in Rehab?: No   Goals Patient/Family Goal for Rehab: Mod I to supervision with PT and OT Expected length of stay: ELOS 5 to 9 days Pt/Family Agrees to Admission and willing to participate: Yes Program Orientation Provided & Reviewed with Pt/Caregiver Including Roles  & Responsibilities: Yes   Decrease burden of Care through IP rehab admission: n/a   Possible need for SNF placement upon discharge:not anticipated   Patient Condition: This patient's condition remains as documented in the consult dated 04/16/2020, in which the Rehabilitation Physician determined and documented that the patient's condition is appropriate for intensive rehabilitative care in an inpatient rehabilitation facility. Will admit to inpatient rehab today.   Preadmission Screen Completed By:  Cleatrice Burke, RN, 04/18/2020 11:56 AM ______________________________________________________________________   Discussed status with Dr. Posey Pronto on 04/18/2020 at 1157 and received approval for admission today.   Admission Coordinator:  Cleatrice Burke, time 6010 Date 04/18/2020         Revision History      Note Details  Author Jamse Arn, MD File Time 04/18/2020 12:01 PM  Author Type  Physician Status Addendum   Last Editor Jamse Arn, MD Service Physical Medicine and Rehabilitation

## 2020-04-18 NOTE — TOC Transition Note (Signed)
Transition of Care [facility]) - CM/SW Discharge Note   Patient Details  Name: Jordan [name] MRN: [id number] Date of Birth: [birth year]  Transition of Care [facility]) CM/SW Contact:  Pollie Friar, RN Phone Number: 04/18/2020, 2:27 PM   Clinical Narrative:    Pt is discharging to CIR today. CM signing off.    Final next level of care: IP Rehab Facility Barriers to Discharge: No Barriers Identified   Patient Goals and CMS Choice        Discharge Placement                       Discharge Plan and Services                                     Social Determinants of Health (SDOH) Interventions     Readmission Risk Interventions No flowsheet data found.

## 2020-04-18 NOTE — Progress Notes (Signed)
Physical Therapy Treatment Patient Details Name: Jordan [name] MRN: [id number] DOB: Jul 16, 1941 Today's Date: 04/18/2020    History of Present Illness Jordan [name] is a 79 y.o. male with medical history significant of DM; CVA: OSA on CPAP; narcolepsy; pacemaker; HTN; HLD; and CAD presenting with R-sided numbness/tingling. CT negative, MRI pending.     PT Comments    Patient seen for mobility progression. Pt continues to require min-mod A for OOB mobility given balance deficits. Continue to recommend CIR for further skilled PT services to maximize independence and safety with mobility.     Follow Up Recommendations  CIR     Equipment Recommendations  Other (comment) (TBD)    Recommendations for Other Services Rehab consult     Precautions / Restrictions Precautions Precautions: Fall Precaution Comments: R sided inattention  Restrictions Weight Bearing Restrictions: No    Mobility  Bed Mobility Overal bed mobility: Modified Independent Bed Mobility: Supine to Sit           General bed mobility comments: HOB elevated and use of rail   Transfers Overall transfer level: Needs assistance Equipment used: None Transfers: Sit to/from Stand Sit to Stand: Min assist         General transfer comment: assist to steady from EOB and recliner  Ambulation/Gait Ambulation/Gait assistance: Min assist;Mod assist Gait Distance (Feet):  (90 ft X 2 trials with seated break) Assistive device: Straight cane (assist at trunk with gait belt) Gait Pattern/deviations: Decreased stride length;Step-through pattern;Decreased step length - right;Drifts right/left     General Gait Details: pt with varying gait speeds and often moving too quickly givne deficits; pt with anterior bias and cues required for decreased gait speed, uprigh posture/forward gaze, and sequencing; required assistance for balance    Stairs             Wheelchair Mobility    Modified Rankin (Stroke  Patients Only) Modified Rankin (Stroke Patients Only) Pre-Morbid Rankin Score: No symptoms Modified Rankin: Moderately severe disability     Balance Overall balance assessment: Needs assistance Sitting-balance support: Feet supported Sitting balance-Leahy Scale: Good     Standing balance support: Single extremity supported;During functional activity Standing balance-Leahy Scale: Poor                              Cognition Arousal/Alertness: Awake/alert Behavior During Therapy: WFL for tasks assessed/performed;Impulsive Overall Cognitive Status: Within Functional Limits for tasks assessed Area of Impairment: Safety/judgement                         Safety/Judgement: Decreased awareness of safety;Decreased awareness of deficits     General Comments: a little impulsive and decreased safety awareness but otherwise [facility] for mobility tasks      Exercises      General Comments        Pertinent Vitals/Pain Pain Assessment: No/denies pain    Home Living                      Prior Function            PT Goals (current goals can now be found in the care plan section) Progress towards PT goals: Progressing toward goals    Frequency    Min 4X/week      PT Plan Current plan remains appropriate    Co-evaluation  AM-PAC PT "6 Clicks" Mobility   Outcome Measure  Help needed turning from your back to your side while in a flat bed without using bedrails?: A Little Help needed moving from lying on your back to sitting on the side of a flat bed without using bedrails?: A Little Help needed moving to and from a bed to a chair (including a wheelchair)?: A Little Help needed standing up from a chair using your arms (e.g., wheelchair or bedside chair)?: A Little Help needed to walk in hospital room?: A Lot Help needed climbing 3-5 steps with a railing? : A Lot 6 Click Score: 16    End of Session Equipment Utilized During  Treatment: Gait belt Activity Tolerance: Patient tolerated treatment well Patient left: with call bell/phone within reach;in chair;with chair alarm set Nurse Communication: Mobility status PT Visit Diagnosis: Unsteadiness on feet (R26.81);Muscle weakness (generalized) (M62.81);Other abnormalities of gait and mobility (R26.89);Difficulty in walking, not elsewhere classified (R26.2);Hemiplegia and hemiparesis Hemiplegia - Right/Left: Right Hemiplegia - dominant/non-dominant: Dominant Hemiplegia - caused by: Unspecified;Cerebral infarction     Time: 1030-1100 PT Time Calculation (min) (ACUTE ONLY): 30 min  Charges:  $Gait Training: 23-37 mins                     Earney Navy, PTA Acute Rehabilitation Services Pager: [phone] Office: [phone]     Darliss Cheney 04/18/2020, 11:57 AM

## 2020-04-19 ENCOUNTER — Inpatient Hospital Stay (HOSPITAL_COMMUNITY): Payer: Medicare HMO | Admitting: Occupational Therapy

## 2020-04-19 ENCOUNTER — Inpatient Hospital Stay (HOSPITAL_COMMUNITY): Payer: Medicare HMO | Admitting: Physical Therapy

## 2020-04-19 DIAGNOSIS — I639 Cerebral infarction, unspecified: Secondary | ICD-10-CM

## 2020-04-19 LAB — CBC WITH DIFFERENTIAL/PLATELET
Abs Immature Granulocytes: 0.02 10*3/uL (ref 0.00–0.07)
Basophils Absolute: 0.1 10*3/uL (ref 0.0–0.1)
Basophils Relative: 1 %
Eosinophils Absolute: 0.7 10*3/uL — ABNORMAL HIGH (ref 0.0–0.5)
Eosinophils Relative: 7 %
HCT: 39.9 % (ref 39.0–52.0)
Hemoglobin: 13.1 g/dL (ref 13.0–17.0)
Immature Granulocytes: 0 %
Lymphocytes Relative: 24 %
Lymphs Abs: 2.4 10*3/uL (ref 0.7–4.0)
MCH: 28.8 pg (ref 26.0–34.0)
MCHC: 32.8 g/dL (ref 30.0–36.0)
MCV: 87.7 fL (ref 80.0–100.0)
Monocytes Absolute: 1 10*3/uL (ref 0.1–1.0)
Monocytes Relative: 10 %
Neutro Abs: 5.8 10*3/uL (ref 1.7–7.7)
Neutrophils Relative %: 58 %
Platelets: 263 10*3/uL (ref 150–400)
RBC: 4.55 MIL/uL (ref 4.22–5.81)
RDW: 13.2 % (ref 11.5–15.5)
WBC: 9.9 10*3/uL (ref 4.0–10.5)
nRBC: 0 % (ref 0.0–0.2)

## 2020-04-19 LAB — COMPREHENSIVE METABOLIC PANEL
ALT: 20 U/L (ref 0–44)
AST: 26 U/L (ref 15–41)
Albumin: 3.1 g/dL — ABNORMAL LOW (ref 3.5–5.0)
Alkaline Phosphatase: 50 U/L (ref 38–126)
Anion gap: 10 (ref 5–15)
BUN: 16 mg/dL (ref 8–23)
CO2: 21 mmol/L — ABNORMAL LOW (ref 22–32)
Calcium: 9.2 mg/dL (ref 8.9–10.3)
Chloride: 108 mmol/L (ref 98–111)
Creatinine, Ser: 0.86 mg/dL (ref 0.61–1.24)
GFR calc Af Amer: >60 mL/min (ref >60)
GFR calc non Af Amer: >60 mL/min (ref >60)
Glucose, Bld: 138 mg/dL — ABNORMAL HIGH (ref 70–99)
Potassium: 3.8 mmol/L (ref 3.5–5.1)
Sodium: 139 mmol/L (ref 135–145)
Total Bilirubin: 0.8 mg/dL (ref 0.3–1.2)
Total Protein: 6 g/dL — ABNORMAL LOW (ref 6.5–8.1)

## 2020-04-19 LAB — GLUCOSE, CAPILLARY
Glucose-Capillary: 117 mg/dL — ABNORMAL HIGH (ref 70–99)
Glucose-Capillary: 133 mg/dL — ABNORMAL HIGH (ref 70–99)
Glucose-Capillary: 142 mg/dL — ABNORMAL HIGH (ref 70–99)
Glucose-Capillary: 166 mg/dL — ABNORMAL HIGH (ref 70–99)

## 2020-04-19 NOTE — Plan of Care (Signed)
  Problem: RH Balance Goal: LTG Patient will maintain dynamic standing with ADLs (OT) Description: LTG:  Patient will maintain dynamic standing balance with assist during activities of daily living (OT)  Flowsheets (Taken 04/19/2020 1220) LTG: Pt will maintain dynamic standing balance during ADLs with: Independent with assistive device   Problem: Sit to Stand Goal: LTG:  Patient will perform sit to stand in prep for activites of daily living with assistance level (OT) Description: LTG:  Patient will perform sit to stand in prep for activites of daily living with assistance level (OT) Flowsheets (Taken 04/19/2020 1220) LTG: PT will perform sit to stand in prep for activites of daily living with assistance level: Independent with assistive device   Problem: RH Bathing Goal: LTG Patient will bathe all body parts with assist levels (OT) Description: LTG: Patient will bathe all body parts with assist levels (OT) Flowsheets (Taken 04/19/2020 1220) LTG: Pt will perform bathing with assistance level/cueing: Set up assist    Problem: RH Dressing Goal: LTG Patient will perform upper body dressing (OT) Description: LTG Patient will perform upper body dressing with assist, with/without cues (OT). Flowsheets (Taken 04/19/2020 1220) LTG: Pt will perform upper body dressing with assistance level of: Independent with assistive device Goal: LTG Patient will perform lower body dressing w/assist (OT) Description: LTG: Patient will perform lower body dressing with assist, with/without cues in positioning using equipment (OT) Flowsheets (Taken 04/19/2020 1220) LTG: Pt will perform lower body dressing with assistance level of: Independent with assistive device   Problem: RH Toileting Goal: LTG Patient will perform toileting task (3/3 steps) with assistance level (OT) Description: LTG: Patient will perform toileting task (3/3 steps) with assistance level (OT)  Flowsheets (Taken 04/19/2020 1220) LTG: Pt will  perform toileting task (3/3 steps) with assistance level: Independent with assistive device   Problem: RH Toilet Transfers Goal: LTG Patient will perform toilet transfers w/assist (OT) Description: LTG: Patient will perform toilet transfers with assist, with/without cues using equipment (OT) Flowsheets (Taken 04/19/2020 1220) LTG: Pt will perform toilet transfers with assistance level of: Independent with assistive device   Problem: RH Tub/Shower Transfers Goal: LTG Patient will perform tub/shower transfers w/assist (OT) Description: LTG: Patient will perform tub/shower transfers with assist, with/without cues using equipment (OT) Flowsheets (Taken 04/19/2020 1220) LTG: Pt will perform tub/shower stall transfers with assistance level of: Set up assist

## 2020-04-19 NOTE — Progress Notes (Addendum)
[facility] PHYSICAL MEDICINE & REHABILITATION PROGRESS NOTE   Subjective/Complaints: Has no complaints this morning. Ate 100% of lunch.  Denies pain, constipation. Has been having difficulty sleeping at night due to hospital CPAP machine- his wife will be bringing his machine from home today.   ROS: Denies pain, constipation  Objective:   No results found. Recent Labs    04/19/20 0525  WBC 9.9  HGB 13.1  HCT 39.9  PLT 263   Recent Labs    04/19/20 0525  NA 139  K 3.8  CL 108  CO2 21*  GLUCOSE 138*  BUN 16  CREATININE 0.86  CALCIUM 9.2    Intake/Output Summary (Last 24 hours) at 04/19/2020 1522 Last data filed at 04/19/2020 1351 Gross per 24 hour  Intake 662 ml  Output 1350 ml  Net -688 ml     Physical Exam: Vital Signs Blood pressure 110/73, pulse 79, temperature 98.6 F (37 C), resp. rate 20, height 5\' 8"  (1.727 m), weight 107 kg, SpO2 97 %.  General: Alert and oriented x 3, No apparent distress HEENT: Head is normocephalic, atraumatic, PERRLA, EOMI, sclera anicteric, oral mucosa pink and moist, dentition intact, ext ear canals clear,  Neck: Supple without JVD or lymphadenopathy Heart: Reg rate and rhythm. No murmurs rubs or gallops Chest: CTA bilaterally without wheezes, rales, or rhonchi; no distress Abdomen: Soft, non-tender, non-distended, bowel sounds positive. Extremities: No clubbing, cyanosis, or edema. Pulses are 2+ Skin: Clean and intact without signs of breakdown Neurological: He is alert and oriented to person, place, and time. He displays weakness. A sensory deficit (RUE--not aware of it in space) is present.  Motor: LUE/LLE: 5/5 proximal distal RUE/RLE: 4+-5/5 proximal distal Sensation subjectively diminished to light touch in right hand  Psych: Pt's affect is appropriate. Pt is cooperative  Assessment/Plan: 1. 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  Physiatrist is providing close team supervision and 24 hour management of active medical problems listed below.  Physiatrist and rehab team continue to assess barriers to discharge/monitor patient progress toward functional and medical goals  Care Tool:  Bathing              Bathing assist       Upper Body Dressing/Undressing Upper body dressing   What is the patient wearing?: Pull over shirt    Upper body assist Assist Level: Set up assist    Lower Body Dressing/Undressing Lower body dressing      What is the patient wearing?: Underwear/pull up, Pants     Lower body assist Assist for lower body dressing: Contact Guard/Touching assist     Toileting Toileting Toileting Activity did not occur (Clothing management and hygiene only): N/A (no void or bm)  Toileting assist Assist for toileting: Minimal Assistance - Patient > 75%     Transfers Chair/bed transfer  Transfers assist     Chair/bed transfer assist level: Minimal Assistance - Patient > 75%     Locomotion Ambulation   Ambulation assist      Assist level: Minimal Assistance - Patient > 75% Assistive device: No Device Max distance: 168ft   Walk 10 feet activity   Assist     Assist level: Minimal Assistance - Patient > 75% Assistive device: No Device   Walk 50 feet activity   Assist    Assist level: Minimal Assistance - Patient > 75% Assistive device: No Device    Walk  150 feet activity   Assist Walk 150 feet activity did not occur: Safety/medical concerns         Walk 10 feet on uneven surface  activity   Assist     Assist level: Minimal Assistance - Patient > 75% (ramp) Assistive device: Other (comment) (none)   Wheelchair     Assist Will patient use wheelchair at discharge?: No             Wheelchair 50 feet with 2 turns activity    Assist            Wheelchair 150 feet activity     Assist          Blood  pressure 110/73, pulse 79, temperature 98.6 F (37 C), resp. rate 20, height 5\' 8"  (1.727 m), weight 107 kg, SpO2 97 %.  Medical Problem List and Plan: 1.  Deficits with mobility, endurance, balance, self-care secondary to acute left thalamic infarct.             -patient may shower             -ELOS/Goals: 6-9 days/Supervision/Mod I            -Initial CIR evaluations today.  2.  Antithrombotics: -DVT/anticoagulation:  Pharmaceutical: Lovenox stopped as ambulating >150 feet             -antiplatelet therapy: DAPT 3. Pain Management: N/A.  4. Mood: LCSW to follow for evaluation and support.              -antipsychotic agents: N/A 5. Neuropsych: This patient is capable of making decisions on his own behalf. 6. Skin/Wound Care: routine pressure relief measures.  7. Fluids/Electrolytes/Nutrition: Monitor I/O.  CMP reviewed and stable.  8. HTN: Monitor BP tid--off lisinopril at this time. Well controlled.              Monitor with increased mobility and with medications as necessary. 9.T2DM: Well controlled --Hgb A1C- 7.9. Will d/c Lantus--resume metformin as patient not interested in insulin. RD provided "Heart Healthy Consistent Carbohydrate Nutrition Therapy" handout from the Academy of Nutrition and Dietetics.  Well controlled.              Monitor with increased mobility. 10. Dyslipidemia: Continue Lipitor. 11. ABLA: CBC stable 6/19.  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  13. Morbid obesity: Encouraged weight loss. Patient interested in losing weight and thereby decreasing his symptoms of OSA. Educated regarding low carb diet and decreased snacking and provided handouts.  14. OSA: CPAP at night- patient is bringing his from home.   >35 minutes spent in care of patient including nutritional counseling, review of vitals and labs, review of therapy progress with patient and wife, discussion of anticoagulation with patient and his wife.   LOS: 1 days A FACE TO FACE EVALUATION WAS  PERFORMED  Clide Deutscher Raylon Lamson 04/19/2020, 3:22 PM

## 2020-04-19 NOTE — Plan of Care (Signed)
°  Problem: RH SKIN INTEGRITY Goal: RH STG SKIN FREE OF INFECTION/BREAKDOWN Description: Remain free of breakdown and infection withmin assist Outcome: Progressing   Problem: RH SAFETY Goal: RH STG ADHERE TO SAFETY PRECAUTIONS W/ASSISTANCE/DEVICE Description: STG Adhere to Safety Precautions With Assistance/Device. Min Outcome: Progressing   Problem: Consults Goal: RH STROKE PATIENT EDUCATION Description: See Patient Education module for education specifics  Outcome: Progressing

## 2020-04-19 NOTE — Plan of Care (Signed)
Brief Nutrition Education Note  RD working remotely.  RD consulted for nutrition education regarding a Heart Healthy diet.   RD attempted to reach patient via phone, however he did not pick up. RD provided "Heart Healthy Consistent Carbohydrate Nutrition Therapy" handout from the Academy of Nutrition and Dietetics. Handout has been attached to patient discharge instructions. Will attempt to provide education prior to discharge as able.   Body mass index is 35.87 kg/m. Pt meets criteria for obese based on current BMI.  Current diet order is HH/CM, patient is consuming approximately 95100% of meals at this time. Labs and medications reviewed. No further nutrition interventions warranted at this time. RD contact information provided. If additional nutrition issues arise, please re-consult RD.  Lajuan Lines, RD, LDN Clinical Nutrition After Hours/Weekend Pager # in [place]

## 2020-04-19 NOTE — Evaluation (Signed)
Physical Therapy Assessment and Plan  Patient Details  Name: Jordan [name] MRN: [id number] Date of Birth: 05-25-1941  PT Diagnosis: Abnormal posture, Abnormality of gait, Difficulty walking, Impaired sensation, Low back pain, Muscle weakness and Pain in back Rehab Potential: Excellent ELOS: ~ 7 days   Today's Date: 04/19/2020 PT Individual Time: 3825-0539 PT Individual Time Calculation (min): 60 min    Problem List:  Patient Active Problem List   Diagnosis Date Noted  . 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[place]) 04/18/2020  . Morbid obesity ([place])   . Acute blood loss anemia   . Acute thalamic infarction ([place]) 04/15/2020  . 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 TIA (transient ischemic attack) 02/17/2018  . DM2 (diabetes mellitus, type 2) ([place]) 02/17/2018  . Insomnia 02/01/2018  . 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 Dyspnea on exertion 03/03/2014  . HLD (hyperlipidemia) 12/13/2007  . Essential hypertension 12/13/2007  . OBESITY 12/05/2007  . Obstructive sleep apnea 12/05/2007  . 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 Coronary atherosclerosis 12/05/2007    Past Medical History:  Past Medical History:  Diagnosis Date  . Arthritis    R shoulder, bone spur  . Arthritis    "hands" (03/25/2016)  . Chronic lower back pain   . Coronary heart disease    Dr Pernell Dupre  . H/O cardiovascular stress test    perhaps last one was 2009  . Heart murmur    12/29/11 echo: mild MR, no AS, trivial TR  . History of gout   . Hyperlipidemia   . Hypertension    saw Dr. Linard Millers last earl;y- 2014, cardiac cath. last done ?2009, blocks seen didn't require any intervention at that point.  . Narcolepsy    [name] 04-11-97; Mean Latency 1.2mn, SOREM 2  . OSA on CPAP    NPSG 12-13-98 AHI 22.7  . PONV (postoperative nausea and vomiting)   . Presence of permanent cardiac pacemaker   . Shortness of breath    with exertion    . Stroke ([place]   . Type II diabetes mellitus ([place]    Past Surgical History:  Past Surgical History:  Procedure Laterality Date  . CARDIAC CATHETERIZATION  2009?  . EP IMPLANTABLE DEVICE N/A 03/25/2016   Procedure: Pacemaker Implant - Dual Chamber;  Surgeon: GEvans Lance MD;  [facility];  Service: Cardiovascular;  Laterality: N/A;  . FRACTURE SURGERY    . INSERT / REPLACE / REMOVE PACEMAKER  03/24/2016  . LEFT AND RIGHT HEART CATHETERIZATION WITH CORONARY ANGIOGRAM N/A 06/05/2014   Procedure: LEFT AND RIGHT HEART CATHETERIZATION WITH CORONARY ANGIOGRAM;  Surgeon: HSinclair Grooms MD;  [facility];  Service: Cardiovascular;  Laterality: N/A;  . NASAL FRACTURE SURGERY    . SHOULDER ARTHROSCOPY WITH ROTATOR CUFF REPAIR AND SUBACROMIAL DECOMPRESSION Right 08/16/2013   Procedure: SHOULDER ARTHROSCOPY WITH ROTATOR CUFF REPAIR AND SUBACROMIAL DECOMPRESSION;  Surgeon: JNita Sells MD;  [facility]  Service: Orthopedics;  Laterality: Right;  Right shoulder arthroscopy rotator cuff repair, subacromial decompression.  . TONSILLECTOMY      Assessment & Plan Clinical Impression: Patient is a 79y.o. year old RH male with history of CAD, T2DM, OSA- on CPAP, SSS s/p PPM; who was admitted on 04/14/2020 with right-sided numbness and tingling.  History taken from chart review and patient.  BP noted to be elevated at admission.  CT head was unremarkable for acute intracranial process.  CTA head/neck was negative for LVO high-grade stenosis.  Echocardiogram with EF of 65-70%.  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  Dr. Erlinda Hong felt the stroke was secondary to small vessel disease and aspirin was added to Plavix with recommendations to stop aspirin after 3 weeks.  Blood pressure is being monitored with permissive hypertension.  Lantus added for management of blood sugars.  Hospital course further complicated by mild drop  in H&H.  Therapy has been ongoing he continues to have deficits in balance and safety affecting ADLs and mobility.  CIR was recommended for follow-up therapy. Patient transferred to CIR on 04/18/2020 .   Patient currently requires min assist with mobility secondary to muscle weakness, decreased cardiorespiratoy endurance, unbalanced muscle activation and decreased sitting balance, decreased standing balance, decreased postural control and decreased balance strategies.  Prior to hospitalization, patient was independent  with mobility and lived with Spouse, Son in a Apartment Theme park manager) home.  Home access is  Elevator.  Patient will benefit from skilled PT intervention to maximize safe functional mobility, minimize fall risk and decrease caregiver burden for planned discharge home with intermittent assist.  Anticipate patient will benefit from follow up OP at discharge.  PT - End of Session Activity Tolerance: Tolerates 30+ min activity with multiple rests Endurance Deficit: Yes Endurance Deficit Description: requires seated rest breaks PT Assessment Rehab Potential (ACUTE/IP ONLY): Excellent PT Barriers to Discharge: Decreased caregiver support;Behavior PT Patient demonstrates impairments in the following area(s): Balance;Perception;Behavior;Safety;Edema;Sensory;Endurance;Skin Integrity;Motor;Nutrition;Pain PT Transfers Functional Problem(s): Bed Mobility;Bed to Chair;Car;Furniture PT Locomotion Functional Problem(s): Ambulation;Stairs PT Plan PT Intensity: Minimum of 1-2 x/day ,45 to 90 minutes PT Frequency: 5 out of 7 days PT Duration Estimated Length of Stay: ~ 7 days PT Treatment/Interventions: Ambulation/gait training;Community reintegration;DME/adaptive equipment instruction;Neuromuscular re-education;Psychosocial support;Stair training;UE/LE Strength taining/ROM;Balance/vestibular training;Discharge planning;Functional electrical stimulation;Pain management;Skin care/wound  management;Therapeutic Activities;UE/LE Coordination activities;Cognitive remediation/compensation;Functional mobility training;Disease management/prevention;Patient/family education;Splinting/orthotics;Therapeutic Exercise;Visual/perceptual remediation/compensation PT Transfers Anticipated Outcome(s): mod-I PT Locomotion Anticipated Outcome(s): mod-I household ambulation PT Recommendation Follow Up Recommendations: Outpatient PT Patient destination: Home Equipment Recommended: To be determined;Rolling walker with 5" wheels  Skilled Therapeutic Intervention Evaluation completed (see details above and below) with education on PT POC and goals and individual treatment initiated with focus on bed mobility, transfers, gait training, stair navigation, activity tolerance, and pt education regarding daily therapy schedule, weekly team meetings, purpose of PT evaluation, and other CIR information. Pt received sitting in recliner and agreeable to therapy session. Stand pivot to EOB, no AD, with min assist for balance - demonstrates unsteadiness and reports baseline R hip pain that causes his first few steps to be unsteady after prolonged sitting. Sit<>supine on flat bed without rails with supervision and increased time. Stand pivot to w/c, no AD, with min assist and less unsteadiness than prior.  Transported to/from gym in w/c for time management and energy conservation. Gait training 146f, no AD, with min assist for balance - pt demonstrates increased anterior trunk flexion, reports this is baseline due to back pain, with cuing for improvement due to minor LOBs forward - reciprocal stepping pattern. Ascended/descended 12 steps using B HRs with self-selected reciprocal pattern and min assist for balance. Demonstrated ability to pick up object from floor with min assist for balance. Ambulatory simulated car transfer, no AD, with min assist for balance - steps in/out with 1 leg sideways. Educated patient that the MD  will decide when it is safe for pt to drive. Gait training ~176fup/down ramp,  no AD, with min assist for balance with more anterior lean walking down ramp. Transported back to room in w/c. Stand pivot to recliner, no AD, with min assist for balance. Pt left seated in recliner with needs in reach and chair alarm on.  PT Evaluation Precautions/Restrictions Precautions Precautions: Fall Restrictions Weight Bearing Restrictions: No Pain Pain Assessment Pain Scale: 0-10 Pain Score: 0-No pain Home Living/Prior Functioning Home Living Available Help at Discharge: Family Type of Home: Apartment (Condo) Home Access: Elevator Home Layout: One level Additional Comments: wife 90 years old and assisting with her Aunt at ALF in [place]; wife to have hip replacement end of June (30th)  Lives With: Spouse;Son Prior Function Level of Independence: Independent with homemaking with ambulation;Independent with gait;Independent with transfers Driving: Yes Vocation: Retired Comments: 28 year old son with [employer]; wife 48 years old with hip replacement surgery end of June; enjoys reading and doing geneaology work Cytogeneticist: Within Advertising copywriter (will continue to monitor for R inattention as noticed by acute therapist but no significant perceptual impairments noted) Praxis Praxis: Intact  Cognition Overall Cognitive Status: Within Functional Limits for tasks assessed  Arousal/Alertness: Awake/alert Orientation Level: Oriented X4 Attention: Focused Focused Attention: Appears intact Sustained Attention: Appears intact Safety/Judgment: Other (comment) (when educating pt on needing MD's clearance to drive pt reports that he would do it anyway) Sensation Sensation Light Touch: Impaired Detail Peripheral sensation comments: hx of peripheral neuropathy Light Touch Impaired Details: Impaired RLE;Impaired LLE Hot/Cold: Not tested Proprioception: Impaired by gross assessment  (noticed some impaired awarness of LE positioning during functional tasks such as stair navigation) Stereognosis: Not tested Coordination Gross Motor Movements are Fluid and Coordinated: No Coordination and Movement Description: gross motor coordination impaired due to impaired balance, impaired sensation, and forward trunk flexed posture Motor  Motor Motor: Abnormal postural alignment and control  Mobility Bed Mobility Bed Mobility: Supine to Sit;Sit to Supine Supine to Sit: Supervision/Verbal cueing Sit to Supine: Supervision/Verbal cueing Transfers Transfers: Sit to Stand;Stand to Sit;Stand Pivot Transfers Sit to Stand: Minimal Assistance - Patient > 75% Stand to Sit: Minimal Assistance - Patient > 75% Stand Pivot Transfers: Minimal Assistance - Patient > 75% Stand Pivot Transfer Details: Tactile cues for weight shifting;Verbal cues for technique;Verbal cues for sequencing;Verbal cues for precautions/safety Transfer (Assistive device): None Locomotion  Gait Ambulation: Yes Gait Assistance: Minimal Assistance - Patient > 75% Gait Distance (Feet): 140 Feet Assistive device: None Gait Assistance Details: Tactile cues for sequencing;Tactile cues for weight shifting;Tactile cues for posture;Verbal cues for sequencing;Verbal cues for gait pattern;Verbal cues for precautions/safety Gait Gait: Yes Gait Pattern: Impaired Gait Pattern: Trunk flexed Stairs / Additional Locomotion Stairs: Yes Stairs Assistance: Minimal Assistance - Patient > 75% Stair Management Technique: Two rails Number of Stairs: 12 Height of Stairs: 6 Ramp: Minimal Assistance - Patient >75% Wheelchair Mobility Wheelchair Mobility: No  Trunk/Postural Assessment  Cervical Assessment Cervical Assessment: Exceptions to [facility] (forward head) Thoracic Assessment Thoracic Assessment: Exceptions to [facility] (scapular protraction and thoracic rounding) Lumbar Assessment Lumbar Assessment: Exceptions to [facility] (posterior pelvic  tilt in sitting) Postural Control Postural Control: Deficits on evaluation Postural Limitations: decreased with pt center of mass anteriorly displaced due to trunk posturing causing anterior lean in standing/ambulating  Balance Balance Balance Assessed: Yes Static Sitting Balance Static Sitting - Balance Support: Feet supported Static Sitting - Level of Assistance: 5: Stand by assistance Static Standing Balance Static Standing - Balance Support: During functional activity Static Standing - Level of Assistance: Other (comment) (CGA) Dynamic  Standing Balance Dynamic Standing - Balance Support: During functional activity Dynamic Standing - Level of Assistance: 4: Min assist;3: Mod assist Extremity Assessment      RLE Assessment RLE Assessment: Exceptions to [facility] RLE Strength Right Hip Flexion: 4/5 Right Knee Flexion: 4+/5 Right Knee Extension: 4+/5 Right Ankle Dorsiflexion: 4+/5 Right Ankle Plantar Flexion: 4+/5 LLE Assessment LLE Assessment: Exceptions to [facility] Active Range of Motion (AROM) Comments: WFL LLE Strength Left Hip Flexion: 4/5 Left Knee Flexion: 4+/5 Left Knee Extension: 4+/5 Left Ankle Dorsiflexion: 4+/5 Left Ankle Plantar Flexion: 4+/5    Refer to Care Plan for Long Term Goals  Recommendations for other services: None   Discharge Criteria: Patient will be discharged from PT if patient refuses treatment 3 consecutive times without medical reason, if treatment goals not met, if there is a change in medical status, if patient makes no progress towards goals or if patient is discharged from hospital.  The above assessment, treatment plan, treatment alternatives and goals were discussed and mutually agreed upon: by patient  Tawana Scale, PT, DPT 04/19/2020, 7:43 AM

## 2020-04-19 NOTE — Evaluation (Signed)
Occupational Therapy Assessment and Plan  Patient Details  Name: Jordan [name] MRN: [id number] Date of Birth: 28-Nov-1940  OT Diagnosis: abnormal posture, hemiplegia affecting dominant side, muscle weakness (generalized) and impaired sensation Rehab Potential: Rehab Potential (ACUTE ONLY): Excellent ELOS: 7-9 days   Today's Date: 04/19/2020 OT Individual Time: 3646-8032 and 1224-8250 OT Individual Time Calculation (min): 72 min  And 71 min  Problem List:  Patient Active Problem List   Diagnosis Date Noted  . 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 Morbid obesity ([place])   . Acute blood loss anemia   . Acute thalamic infarction ([place]) 04/15/2020  . 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 TIA (transient ischemic attack) 02/17/2018  . DM2 (diabetes mellitus, type 2) ([place]) 02/17/2018  . Insomnia 02/01/2018  . 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 Dyspnea on exertion 03/03/2014  . HLD (hyperlipidemia) 12/13/2007  . Essential hypertension 12/13/2007  . OBESITY 12/05/2007  . Obstructive sleep apnea 12/05/2007  . 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 Coronary atherosclerosis 12/05/2007    Past Medical History:  Past Medical History:  Diagnosis Date  . Arthritis    R shoulder, bone spur  . Arthritis    "hands" (03/25/2016)  . Chronic lower back pain   . Coronary heart disease    Dr Pernell Dupre  . H/O cardiovascular stress test    perhaps last one was 2009  . Heart murmur    12/29/11 echo: mild MR, no AS, trivial TR  . History of gout   . Hyperlipidemia   . Hypertension    saw Dr. Linard Millers last earl;y- 2014, cardiac cath. last done ?2009, blocks seen didn't require any intervention at that point.  . Narcolepsy    [name] 04-11-97; Mean Latency 1.50mn, SOREM 2  . OSA on CPAP    NPSG 12-13-98 AHI 22.7  . PONV (postoperative nausea and vomiting)   . Presence of permanent cardiac pacemaker   .  Shortness of breath    with exertion   . [place]   . Type II diabetes mellitus ([facility]    Past Surgical History:  Past Surgical History:  Procedure Laterality Date  . CARDIAC CATHETERIZATION  2009?  . EP IMPLANTABLE DEVICE N/A 03/25/2016   Procedure: Pacemaker Implant - Dual Chamber;  Surgeon: GEvans Lance MD;  [facility];  Service: Cardiovascular;  Laterality: N/A;  . FRACTURE SURGERY    . INSERT / REPLACE / REMOVE PACEMAKER  03/24/2016  . LEFT AND RIGHT HEART CATHETERIZATION WITH CORONARY ANGIOGRAM N/A 06/05/2014   Procedure: LEFT AND RIGHT HEART CATHETERIZATION WITH CORONARY ANGIOGRAM;  Surgeon: HSinclair Grooms MD;  [facility];  Service: Cardiovascular;  Laterality: N/A;  . NASAL FRACTURE SURGERY    . SHOULDER ARTHROSCOPY WITH ROTATOR CUFF REPAIR AND SUBACROMIAL DECOMPRESSION Right 08/16/2013   Procedure: SHOULDER ARTHROSCOPY WITH ROTATOR CUFF REPAIR AND SUBACROMIAL DECOMPRESSION;  Surgeon: JNita Sells MD;  [facility]  Service: Orthopedics;  Laterality: Right;  Right shoulder arthroscopy rotator cuff repair, subacromial decompression.  . TONSILLECTOMY      Assessment & Plan Clinical Impression: 79y.o.RH-malewith history of CAD, T2DM,hypertensionOSA-on CPAP, narcolepsy, SSS s/p PPM; who was admitted on6/14/2021 with right-sided numbness and tingling. BP noted to be elevated on admissionat 204/116 upon arrival. CT head unremarkable for acute intracranial process. CTA head/neck negative for LVO or high grade stenosis.Echocardiogram with ejection fraction of 65-70percent withgrade II DD and  elevated left atrial pressure. UDS negative. MRI brain showed acute left thalamicinfarct and PPM interrogation pending. Dr. Erlinda Hong felt that stroke secondary to small vessel disease and ASA added to Plavix with recommendations to d/c ASA after 3 weeks.Pacemaker interrogated with no afib noted.Therapy evaluations revealed balance deficits with  poor safety awareness.Lovenox for VTE prophylaxis.Hospital course was complicated by acute blood loss anemia.    Patient currently requires min with basic self-care skills secondary to muscle weakness, decreased cardiorespiratoy endurance, unbalanced muscle activation and decreased coordination and decreased standing balance and hemiplegia.  Prior to hospitalization, patient could complete BADLs with independent .  Patient will benefit from skilled intervention to increase independence with basic self-care skills prior to discharge home with family.  Anticipate patient will require intermittent supervision and follow up home health.  OT - End of Session Endurance Deficit: Yes Endurance Deficit Description: requires seated rest breaks OT Assessment Rehab Potential (ACUTE ONLY): Excellent OT Barriers to Discharge: Medical stability OT Patient demonstrates impairments in the following area(s): Balance;Motor;Sensory;Safety OT Basic ADL's Functional Problem(s): Grooming;Bathing;Dressing;Toileting OT Advanced ADL's Functional Problem(s): Light Housekeeping OT Transfers Functional Problem(s): Toilet;Tub/Shower OT Additional Impairment(s): None OT Plan OT Intensity: Minimum of 1-2 x/day, 45 to 90 minutes OT Frequency: 5 out of 7 days OT Duration/Estimated Length of Stay: 7-9 days OT Treatment/Interventions: Balance/vestibular training;Community reintegration;Disease mangement/prevention;Neuromuscular re-education;Patient/family education;Self Care/advanced ADL retraining;Therapeutic Exercise;UE/LE Coordination activities;Wheelchair propulsion/positioning;UE/LE Strength taining/ROM;Therapeutic Activities;Psychosocial support;Pain management;Functional mobility training;DME/adaptive equipment instruction;Discharge planning OT Self Feeding Anticipated Outcome(s): No goal OT Basic Self-Care Anticipated Outcome(s): Supervision/setup-Mod I OT Toileting Anticipated Outcome(s): Mod I OT Bathroom Transfers  Anticipated Outcome(s): Supervision/setup-Mod I OT Recommendation Patient destination: Home Follow Up Recommendations: Home health OT Equipment Recommended: To be determined   Skilled Therapeutic Intervention Skilled OT session completed with focus on initial evaluation, education on OT role/POC, and establishment of patient-centered goals.   Pt greeted in bed with no c/o pain initially. Supine<sit from flat bed without bedrail per home setup completed with vcs to place feet on the floor once EOB. Pt then selected clothing items from duffel bag, using Rt hand at dominant level. He placed them on the Rt side and was able to locate clothing without cues during tx. Pt engaged in dressing tasks sit<stand without AD and CGA for standing balance. Note that pt has a slight anterior bias that he reports was normal PTA. CGA for ambulatory transfers to toilet and TTB without AD, slight anterior bias with kyphotic posturing noted but no overt LOBs. He used the RW to ambulate from EOB to the sink with supervision assist. Pt then engaged in oral care and grooming tasks, using the Rt hand at dominant level. He reports fine motor deficits in the Rt are most evident when writing and when trying to reach for small/thin items. Pt independently purchased several puzzles for room use to work on his Rt hand outside of tx. Note that he is a bit verbose and circuitous when answering questions pertaining to PLOF. Pt remained sitting in the recliner at end of session with the chair alarm set.  2nd Session 1:1 tx (71 min) Pt greeted in the recliner with no c/o pain, requesting to shower during this session. Pt ambulated with RW and supervision to the TTB. He bathed while seated with min cuing for adaptive techniques. Dressing was then completed sit<stand from recliner using RW for standing support and close supervision for balance. Pt mindful of using his Rt hand at dominant level during stated tasks. Afterwards pt completed [address]  Peg Test with results of 43 seconds on the Lt side and 44 seconds on the Rt side. Pt remained sitting in the recliner with all needs within reach and chair alarm set. Pt continues to be verbose during session and requires increased time to meet task demands.   OT Evaluation Vital Signs Therapy Vitals Temp: 98.6 F (37 C) Pulse Rate: 79 Resp: 20 BP: 110/73 Patient Position (if appropriate): Sitting Oxygen Therapy SpO2: 97 % O2 Device: Room Air Pain: in lateral aspects of back. Pt opted to do some stretches for back once OT left vs ask RN for pain medicine    Home Living/Prior Daisy expects to be discharged to:: Private residence Living Arrangements: Spouse/significant other, Children Available Help at Discharge: Family Type of Home: Apartment (condo, 10th floor) Home Access: Elevator, Level entry Home Layout: One level Bathroom Shower/Tub: Tub/shower unit, Walk-in shower (Both bathrooms are walker accessible) Technical brewer Accessibility: Yes Additional Comments: Spouse uses a walker for functional mobility and assists aunt in [place], can provide PRN supervision and pt reports his son will always be at home to help out  Lives With: Spouse, Son IADL History Homemaking Responsibilities:  (Pt reports being responsible for sweeping the floor, otherwise his family does take out meals and son helps out as needed) Occupation: Retired 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 Prior Function Level of Independence: Independent with homemaking with ambulation, Independent with gait, Independent with transfers, Independent with basic ADLs Driving: Yes Vocation: Retired ADL ADL Eating: Not assessed Grooming: Supervision/safety Where Assessed-Grooming: Standing at sink Upper Body Bathing: Setup Where Assessed-Upper Body Bathing: Shower Lower Body Bathing: Supervision/safety Where  Assessed-Lower Body Bathing: Shower Upper Body Dressing: Setup Where Assessed-Upper Body Dressing: Edge of bed Lower Body Dressing: Contact guard Where Assessed-Lower Body Dressing: Edge of bed Toileting: Not assessed Toilet Transfer: Therapist, music Method: Ambulating (without AD) Science writer: Emergency planning/management officer Transfer: Curator Method: Ambulating (without AD) Youth worker: Radio broadcast assistant Vision Baseline Vision/History: Wears glasses Wears Glasses: At all times Patient Visual Report: No change from baseline Perception  Perception: Within Functional Limits Praxis Praxis: Intact Cognition Overall Cognitive Status: Within Functional Limits for tasks assessed Arousal/Alertness: Awake/alert Orientation Level: Person;Place;Situation Person: Oriented Place: Oriented Situation: Oriented Year: 2021 Month: June Day of Week: Correct Memory: Appears intact Immediate Memory Recall: Sock;Blue;Bed Memory Recall Sock: Without Cue Memory Recall Blue: Without Cue Memory Recall Bed: With Cue Attention: Focused Focused Attention: Appears intact Sustained Attention: Appears intact Awareness: Appears intact Problem Solving: Appears intact Safety/Judgment: Other (comment) (when educating pt on needing MD's clearance to drive pt reports that he would do it anyway) Sensation Sensation Light Touch: Impaired Detail Peripheral sensation comments: hx of peripheral neuropathy Coordination Gross Motor Movements are Fluid and Coordinated: No Fine Motor Movements are Fluid and Coordinated: No Coordination and Movement Description: gross motor coordination impaired due to impaired balance, impaired sensation, and forward trunk flexed posture Finger Nose Finger Test: Mildly dysmetric bilaterally 9 [address] Peg Test: 43 seconds Lt; 44 seconds Rt Motor  Motor Motor: Abnormal postural alignment and control Mobility  CGA  ambulatory bathroom transfers without AD Trunk/Postural Assessment  Cervical Assessment Cervical Assessment: Exceptions to [facility] (forward head) Thoracic Assessment Thoracic Assessment: Exceptions to [facility] (thoracic rounding) Lumbar Assessment Lumbar Assessment: Exceptions to [facility] (posterior pelvic tilt) Postural Control Postural Control: Deficits on evaluation Postural Limitations: decreased with pt center of mass anteriorly  displaced due to trunk posturing causing anterior lean in standing/ambulating  Balance Balance Balance Assessed: Yes Dynamic Sitting Balance Dynamic Sitting - Balance Support: No upper extremity supported;During functional activity;Feet supported Dynamic Sitting - Level of Assistance: 5: Stand by assistance (donning gripper socks) Dynamic Standing Balance Dynamic Standing - Balance Support: During functional activity;No upper extremity supported Dynamic Standing - Level of Assistance: 4: Min assist Dynamic Standing - Balance Activities: Lateral lean/weight shifting;Forward lean/weight shifting (LB dressing, sit<stand without AD) Extremity/Trunk Assessment RUE Assessment RUE Assessment: Within Functional Limits Active Range of Motion (AROM) Comments: WNL General Strength Comments: 3+ to 4-/5 grossly LUE Assessment LUE Assessment: Within Functional Limits Active Range of Motion (AROM) Comments: WNL General Strength Comments: 4 to 4+/5 grossly   Refer to Care Plan for Long Term Goals  Recommendations for other services: None    Discharge Criteria: Patient will be discharged from OT if patient refuses treatment 3 consecutive times without medical reason, if treatment goals not met, if there is a change in medical status, if patient makes no progress towards goals or if patient is discharged from hospital.  The above assessment, treatment plan, treatment alternatives and goals were discussed and mutually agreed upon: by patient  Skeet Simmer 04/19/2020, 4:08  PM

## 2020-04-20 LAB — GLUCOSE, CAPILLARY
Glucose-Capillary: 124 mg/dL — ABNORMAL HIGH (ref 70–99)
Glucose-Capillary: 129 mg/dL — ABNORMAL HIGH (ref 70–99)
Glucose-Capillary: 108 mg/dL — ABNORMAL HIGH (ref 70–99)
Glucose-Capillary: 141 mg/dL — ABNORMAL HIGH (ref 70–99)

## 2020-04-20 NOTE — Progress Notes (Signed)
Small soft nodule noted to Lt inner ankle/heel area- foam applied. Red flaky skin to bilateral feet. Red circular area to rt inner ankle measuring 2cmx3cm- foam applied

## 2020-04-20 NOTE — Plan of Care (Signed)
  Problem: RH SKIN INTEGRITY Goal: RH STG SKIN FREE OF INFECTION/BREAKDOWN Description: Remain free of breakdown and infection withmin assist Outcome: Progressing   Problem: RH SAFETY Goal: RH STG ADHERE TO SAFETY PRECAUTIONS W/ASSISTANCE/DEVICE Description: STG Adhere to Safety Precautions With Assistance/Device. Min Outcome: Progressing   Problem: Consults Goal: RH STROKE PATIENT EDUCATION Description: See Patient Education module for education specifics  Outcome: Progressing

## 2020-04-20 NOTE — Progress Notes (Signed)
[facility] PHYSICAL MEDICINE & REHABILITATION PROGRESS NOTE   Subjective/Complaints: Sleeping soundly.   Had discussion wife wife yesterday regarding his concerns of him having Parkinson's Disease. He has been evaluated as an outpatient by neurology in the past and was told he did not have Parkinson's--she is concerned about his shuffling gait, right hand tremor at rest at times, and cognitive deficits. Discussed that he will have outpatient follow-up with neurology when he leaves here.   ROS: Denies pain, constipation  Objective:   No results found. Recent Labs    04/19/20 0525  WBC 9.9  HGB 13.1  HCT 39.9  PLT 263   Recent Labs    04/19/20 0525  NA 139  K 3.8  CL 108  CO2 21*  GLUCOSE 138*  BUN 16  CREATININE 0.86  CALCIUM 9.2    Intake/Output Summary (Last 24 hours) at 04/20/2020 1119 Last data filed at 04/20/2020 0802 Gross per 24 hour  Intake 560 ml  Output 1100 ml  Net -540 ml     Physical Exam: Vital Signs Blood pressure 137/85, pulse 65, temperature 97.6 F (36.4 C), resp. rate 18, height 5\' 8"  (1.727 m), weight 107 kg, SpO2 94 %.  General: Sleeping soundly. No apparent distress HEENT: Head is normocephalic, atraumatic, PERRLA, EOMI, sclera anicteric, oral mucosa pink and moist, dentition intact, ext ear canals clear,  Neck: Supple without JVD or lymphadenopathy Heart: Reg rate and rhythm. No murmurs rubs or gallops Chest: CTA bilaterally without wheezes, rales, or rhonchi; no distress Abdomen: Soft, non-tender, non-distended, bowel sounds positive. Extremities: No clubbing, cyanosis, or edema. Pulses are 2+ Skin: Clean and intact without signs of breakdown Neurological: He is alert and oriented to person, place, and time. He displays weakness. A sensory deficit (RUE--not aware of it in space) is present. Currently with no resting or intentional tremor.  Motor: LUE/LLE: 5/5 proximal distal RUE/RLE: 4+-5/5 proximal distal Sensation subjectively  diminished to light touch in right hand  Psych: Pt's affect is appropriate. Pt is cooperative  Assessment/Plan: 1. 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  Physiatrist is providing close team supervision and 24 hour management of active medical problems listed below.  Physiatrist and rehab team continue to assess barriers to discharge/monitor patient progress toward functional and medical goals  Care Tool:  Bathing    Body parts bathed by patient: Right arm, Left arm, Chest, Abdomen, Front perineal area, Buttocks, Right upper leg, Left upper leg, Right lower leg, Left lower leg, Face         Bathing assist Assist Level: Supervision/Verbal cueing     Upper Body Dressing/Undressing Upper body dressing   What is the patient wearing?: Pull over shirt    Upper body assist Assist Level: Set up assist    Lower Body Dressing/Undressing Lower body dressing      What is the patient wearing?: Underwear/pull up, Pants     Lower body assist Assist for lower body dressing: Contact Guard/Touching assist     Toileting Toileting Toileting Activity did not occur (Clothing management and hygiene only): N/A (no void or bm)  Toileting assist Assist for toileting: Minimal Assistance - Patient > 75%     Transfers Chair/bed transfer  Transfers assist     Chair/bed transfer assist level: Minimal Assistance - Patient > 75%     Locomotion Ambulation   Ambulation assist      Assist level: Minimal Assistance -  Patient > 75% Assistive device: No Device Max distance: 148ft   Walk 10 feet activity   Assist     Assist level: Minimal Assistance - Patient > 75% Assistive device: No Device   Walk 50 feet activity   Assist    Assist level: Minimal Assistance - Patient > 75% Assistive device: No Device    Walk 150 feet activity   Assist Walk 150 feet  activity did not occur: Safety/medical concerns         Walk 10 feet on uneven surface  activity   Assist     Assist level: Minimal Assistance - Patient > 75% (ramp) Assistive device: Other (comment) (none)   Wheelchair     Assist Will patient use wheelchair at discharge?: No             Wheelchair 50 feet with 2 turns activity    Assist            Wheelchair 150 feet activity     Assist          Blood pressure 137/85, pulse 65, temperature 97.6 F (36.4 C), resp. rate 18, height 5\' 8"  (1.727 m), weight 107 kg, SpO2 94 %.  Medical Problem List and Plan: 1.  Deficits with mobility, endurance, balance, self-care secondary to acute left thalamic infarct.             -patient may shower             -ELOS/Goals: 6-9 days/Supervision/Mod I            -Continue CIR 2.  Antithrombotics: -DVT/anticoagulation:  Pharmaceutical: Lovenox stopped as ambulating >150 feet.              -antiplatelet therapy: DAPT. Had discussion with patient and wife about starting Aspirin. He had been on it in the past for CAD and it was d/ced by cardiology but patient cannot recall the reason.  3. Pain Management: N/A.  4. Mood: LCSW to follow for evaluation and support.              -antipsychotic agents: N/A 5. Neuropsych: This patient is capable of making decisions on his own behalf. 6. Skin/Wound Care: routine pressure relief measures.  7. Fluids/Electrolytes/Nutrition: Monitor I/O.  CMP reviewed and stable.  8. HTN: Monitor BP tid--off lisinopril at this time. Well controlled.              Monitor with increased mobility and with medications as necessary. 9.T2DM: Well controlled --Hgb A1C- 7.9. Will d/c Lantus--resume metformin as patient not interested in insulin. RD provided "Heart Healthy Consistent Carbohydrate Nutrition Therapy" handout from the Academy of Nutrition and Dietetics.  Well controlled.   6/20: Patient concerned about receiving insulin aspart. Explained  sliding scale and that his sugars have been relatively well controlled, and that he will likely not need insulin when he leaves here, especially if he follows low carb diet, see #13             Monitor with increased mobility. 10. Dyslipidemia: Continue Lipitor. 11. ABLA: CBC stable 6/19.  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  13. Morbid obesity: Encouraged weight loss. Patient interested in losing weight and thereby decreasing his symptoms of OSA. Educated regarding low carb diet and decreased snacking and provided handouts.  14. OSA: CPAP at night- patient is bringing his from home.     LOS: 2 days A FACE TO FACE EVALUATION WAS PERFORMED  Martha Clan P Shanan Fitzpatrick 04/20/2020, 11:19 AM

## 2020-04-20 NOTE — Progress Notes (Signed)
Pt already wearing home CPAP and resting comfortably when RT came by for rounds. Hospital issued machine will be removed from room in the morning as not to disturb the patient.

## 2020-04-21 ENCOUNTER — Inpatient Hospital Stay (HOSPITAL_COMMUNITY): Payer: Medicare HMO | Admitting: Physical Therapy

## 2020-04-21 ENCOUNTER — Inpatient Hospital Stay (HOSPITAL_COMMUNITY): Payer: Medicare HMO | Admitting: Occupational Therapy

## 2020-04-21 DIAGNOSIS — E1165 Type 2 diabetes mellitus with hyperglycemia: Secondary | ICD-10-CM

## 2020-04-21 DIAGNOSIS — I10 Essential (primary) hypertension: Secondary | ICD-10-CM

## 2020-04-21 DIAGNOSIS — E8809 Other disorders of plasma-protein metabolism, not elsewhere classified: Secondary | ICD-10-CM

## 2020-04-21 DIAGNOSIS — E46 Unspecified protein-calorie malnutrition: Secondary | ICD-10-CM

## 2020-04-21 LAB — CBC
HCT: 44.1 % (ref 39.0–52.0)
Hemoglobin: 14.3 g/dL (ref 13.0–17.0)
MCH: 28.7 pg (ref 26.0–34.0)
MCHC: 32.4 g/dL (ref 30.0–36.0)
MCV: 88.6 fL (ref 80.0–100.0)
Platelets: 263 10*3/uL (ref 150–400)
RBC: 4.98 MIL/uL (ref 4.22–5.81)
RDW: 13.2 % (ref 11.5–15.5)
WBC: 8.6 10*3/uL (ref 4.0–10.5)
nRBC: 0 % (ref 0.0–0.2)

## 2020-04-21 LAB — BASIC METABOLIC PANEL
Anion gap: 11 (ref 5–15)
BUN: 18 mg/dL (ref 8–23)
CO2: 21 mmol/L — ABNORMAL LOW (ref 22–32)
Calcium: 9.5 mg/dL (ref 8.9–10.3)
Chloride: 107 mmol/L (ref 98–111)
Creatinine, Ser: 1.02 mg/dL (ref 0.61–1.24)
GFR calc Af Amer: >60 mL/min (ref >60)
GFR calc non Af Amer: >60 mL/min (ref >60)
Glucose, Bld: 129 mg/dL — ABNORMAL HIGH (ref 70–99)
Potassium: 4 mmol/L (ref 3.5–5.1)
Sodium: 139 mmol/L (ref 135–145)

## 2020-04-21 LAB — GLUCOSE, CAPILLARY
Glucose-Capillary: 115 mg/dL — ABNORMAL HIGH (ref 70–99)
Glucose-Capillary: 145 mg/dL — ABNORMAL HIGH (ref 70–99)
Glucose-Capillary: 140 mg/dL — ABNORMAL HIGH (ref 70–99)
Glucose-Capillary: 150 mg/dL — ABNORMAL HIGH (ref 70–99)

## 2020-04-21 MED ORDER — LIVING WELL WITH DIABETES BOOK
Freq: Once | Status: AC
  Filled 2020-04-21: qty 1

## 2020-04-21 MED ORDER — BLOOD PRESSURE CONTROL BOOK
Freq: Once | Status: AC
  Filled 2020-04-21: qty 1

## 2020-04-21 NOTE — Progress Notes (Signed)
Pt requested to use our machine. RT placed pt on cpap, home mask and home settings. RTwill continue to monitor as needed.

## 2020-04-21 NOTE — Progress Notes (Signed)
[facility] Individual Statement of Services  Patient Name:  [name]  Date:  04/21/2020  Welcome to the [facility].  Our goal is to provide you with an individualized program based on your diagnosis and situation, designed to meet your specific needs.  With this comprehensive rehabilitation program, you will be expected to participate in at least 3 hours of rehabilitation therapies Monday-Friday, with modified therapy programming on the weekends.  Your rehabilitation program will include the following services:  Physical Therapy (PT), Occupational Therapy (OT), Speech Therapy (ST), 24 hour per day rehabilitation nursing, Therapeutic Recreaction (TR), Neuropsychology, Care Coordinator, Rehabilitation Medicine, Nutrition Services, Pharmacy Services and Other  Weekly team conferences will be held on Wednesdays to discuss your progress.  Your Inpatient Rehabilitation Care Coordinator will talk with you frequently to get your input and to update you on team discussions.  Team conferences with you and your family in attendance may also be held.  Expected length of stay: 5-9 Days  Overall anticipated outcome: MOD I to Supervision  Depending on your progress and recovery, your program may change. Your Inpatient Rehabilitation Care Coordinator will coordinate services and will keep you informed of any changes. Your Inpatient Rehabilitation Care Coordinator's name and contact numbers are listed  below.  The following services may also be recommended but are not provided by the [facility]:    [place] will be made to provide these services after discharge if needed.  Arrangements include referral to agencies that provide these services.  Your insurance has been verified to be:  [employer] Your primary doctor is:  [name], Md  Pertinent information  will be shared with your doctor and your insurance company.  Inpatient Rehabilitation Care Coordinator:  Erlene Quan, [place] or [phone]  Information discussed with and copy given to patient by: Dyanne Iha, 04/21/2020, 9:07 AM

## 2020-04-21 NOTE — Plan of Care (Signed)
  Problem: RH SAFETY Goal: RH STG ADHERE TO SAFETY PRECAUTIONS W/ASSISTANCE/DEVICE Description: STG Adhere to Safety Precautions With Assistance/Device. Min Outcome: Progressing

## 2020-04-21 NOTE — Progress Notes (Signed)
Physical Therapy Session Note  Patient Details  Name: Jordan [name] MRN: [id number] Date of Birth: [birth year]  Today's Date: 04/21/2020 PT Individual Time: 1405-1500 PT Individual Time Calculation (min): 55 min   Short Term Goals: Week 1:  PT Short Term Goal 1 (Week 1): = to LTGs based on ELOS  Skilled Therapeutic Interventions/Progress Updates:    Patient received in bed, asleep but easily woken today. Able to perform functional transfers with S-min guard with no device, however with gait training in hall with no device continues to require MinA and mod cues to control anterior lean and prevent LOB and tripping due to frequent increases in gait speed and lack of control of motion. Introduced Radiation protection practitioner and he showed dramatic improvement in gait mechanics and posture, with assist for gait distances up to 136f fading to S level with cues for use of brakes on device when stopping to sit on it. Also worked on fMedical illustratortoday including tandem stance holds on tile as well as AP and cross-midline cone taps on step with Min-ModA to maintain balance due to general weakness and reduced functional balance skills. He was very curious and had lots of questions today and was educated accordingly regarding signs/syptoms to watch for in case he has a future stroke, also very curious about dietary recommendations to help prevent another stroke- provided very general dietary recommendations (fruits/veg and avoiding things like white rice) but was recommended to ask for referral to formal RD for further education regarding dietary changes in the future. Left up in recliner with all needs met, alarm active this afternoon.   Therapy Documentation Precautions:  Precautions Precautions: Fall Restrictions Weight Bearing Restrictions: No Pain: Pain Assessment Pain Scale: 0-10 Pain Score: 0-No pain    Therapy/Group: Individual Therapy   KWindell [name] DPT, PN1   Supplemental Physical  Therapist CGarner   Pager [phone] Rehab Office [phone]   04/21/2020, 3:48 PM

## 2020-04-21 NOTE — Progress Notes (Signed)
Physical Therapy Session Note  Patient Details  Name: Jordan [name] MRN: [id number] Date of Birth: 1941-04-18  Today's Date: 04/21/2020 PT Individual Time: 1005-1100 PT Individual Time Calculation (min): 55 min   Short Term Goals: Week 1:  PT Short Term Goal 1 (Week 1): = to LTGs based on ELOS  Skilled Therapeutic Interventions/Progress Updates:    Patient received up in chair, pleasant and willing to participate in session today. Continues to want to go home ASAP, focused session primarily on gait training multiple distances of 150-237f with no device and frequent shuffling fast step pattern with BOS far anterior over toes and often needed MinA to catch balance/prevent fall anteriorly today. Worked on improving step lengths and BOS as well as upright posture with visual cues such as horseshoes with gait much improved and assistance faded to min guard however gait quality quickly deteriorates when he is externally or internally distracted. Otherwise rode Nustep on resistance 4 for 8 minutes with B UEs/LEs for improved reciprocal movement and functional activity tolerance. Noted some backward festination intermittently today too. Left up in recliner with all needs met and chair alarm active this morning.   Therapy Documentation Precautions:  Precautions Precautions: Fall Restrictions Weight Bearing Restrictions: No   Pain: Pain Assessment Pain Scale: 0-10 Pain Score: 0-No pain    Therapy/Group: Individual Therapy  KWindell Norfolk DPT, PN1   Supplemental Physical Therapist [place]   Pager [phone] Rehab Office [phone]  04/21/2020, 12:32 PM

## 2020-04-21 NOTE — Progress Notes (Signed)
[place] PHYSICAL MEDICINE & REHABILITATION PROGRESS NOTE   Subjective/Complaints: Patient seen standing up at the sink working with therapies this morning.  He states he slept well overnight.  He states he had a uneventful weekend.  Good standing balance noted.  He has questions regarding his discharge date.  ROS: Denies CP, SOB, N/V/D  Objective:   No results found. Recent Labs    04/19/20 0525 04/21/20 0651  WBC 9.9 8.6  HGB 13.1 14.3  HCT 39.9 44.1  PLT 263 263   Recent Labs    04/19/20 0525 04/21/20 0651  NA 139 139  K 3.8 4.0  CL 108 107  CO2 21* 21*  GLUCOSE 138* 129*  BUN 16 18  CREATININE 0.86 1.02  CALCIUM 9.2 9.5    Intake/Output Summary (Last 24 hours) at 04/21/2020 0843 Last data filed at 04/21/2020 0759 Gross per 24 hour  Intake 360 ml  Output 1375 ml  Net -1015 ml     Physical Exam: Vital Signs Blood pressure (!) 147/117, pulse 71, temperature 97.7 F (36.5 C), resp. rate 16, height 5\' 8"  (1.727 m), weight 107 kg, SpO2 96 %. Constitutional: No distress . Vital signs reviewed. HENT: Normocephalic.  Atraumatic. Eyes: EOMI. No discharge. Cardiovascular: No JVD.  RRR. Respiratory: Normal effort.  No stridor.  Bilaterally clear to auscultation. GI: Non-distended. Skin: Warm and dry.  Intact. Psych: Normal mood.  Normal behavior. Musc: No edema in extremities.  No tenderness in extremities. Neurological: Alert Motor: LUE/LLE: 5/5 proximal distal RUE/RLE: 4+-5/5 proximal distal, unchanged  Assessment/Plan: 1. 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  Physiatrist is providing close team supervision and 24 hour management of active medical problems listed below.  Physiatrist and rehab team continue to assess barriers to discharge/monitor patient progress toward functional and medical goals  Care Tool:  Bathing    Body parts bathed  by patient: Right arm, Left arm, Chest, Abdomen, Front perineal area, Buttocks, Right upper leg, Left upper leg, Right lower leg, Left lower leg, Face         Bathing assist Assist Level: Supervision/Verbal cueing     Upper Body Dressing/Undressing Upper body dressing   What is the patient wearing?: Pull over shirt    Upper body assist Assist Level: Set up assist    Lower Body Dressing/Undressing Lower body dressing      What is the patient wearing?: Underwear/pull up, Pants     Lower body assist Assist for lower body dressing: Contact Guard/Touching assist     Toileting Toileting Toileting Activity did not occur (Clothing management and hygiene only): N/A (no void or bm)  Toileting assist Assist for toileting: Minimal Assistance - Patient > 75%     Transfers Chair/bed transfer  Transfers assist     Chair/bed transfer assist level: Minimal Assistance - Patient > 75%     Locomotion Ambulation   Ambulation assist      Assist level: Minimal Assistance - Patient > 75% Assistive device: No Device Max distance: 116ft   Walk 10 feet activity   Assist     Assist level: Minimal Assistance - Patient > 75% Assistive device: No Device   Walk 50 feet activity   Assist    Assist level: Minimal Assistance - Patient > 75% Assistive device: No Device    Walk 150 feet activity   Assist Walk 150 feet activity did not occur: Safety/medical concerns  Walk 10 feet on uneven surface  activity   Assist     Assist level: Minimal Assistance - Patient > 75% (ramp) Assistive device: Other (comment) (none)   Wheelchair     Assist Will patient use wheelchair at discharge?: No             Wheelchair 50 feet with 2 turns activity    Assist            Wheelchair 150 feet activity     Assist          Blood pressure (!) 147/117, pulse 71, temperature 97.7 F (36.5 C), resp. rate 16, height 5\' 8"  (1.727 m), weight 107 kg,  SpO2 96 %.  Medical Problem List and Plan: 1.  Deficits with mobility, endurance, balance, self-care secondary to acute left thalamic infarct.  Continue CIR 2.  Antithrombotics: -DVT/anticoagulation:  Pharmaceutical: Lovenox stopped as ambulating >150 feet.              -antiplatelet therapy: DAPT.  3. Pain Management: N/A.  4. Mood: LCSW to follow for evaluation and support.              -antipsychotic agents: N/A 5. Neuropsych: This patient is capable of making decisions on his own behalf. 6. Skin/Wound Care: routine pressure relief measures.  7. Fluids/Electrolytes/Nutrition: Monitor I/Os.    BMP within acceptable range, except glucose on 6/21 8. HTN: Monitor BP   off lisinopril at this time.   Labile on 6/21, avoid hypoperfusion             Monitor with increased mobility and with medications as necessary. 9.T2DM with hyperglycemia: Hgb A1C- 7.9. Resumed metformin as patient not interested in insulin.   Slightly elevated on 6/21  See #13             Monitor with increased mobility. 10. Dyslipidemia: Continue Lipitor. 11. 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  13. Morbid obesity: Encouraged weight loss. Patient interested in losing weight and thereby decreasing his symptoms of OSA.  Has been educated regarding low carb diet and decreased snacking and provided handouts.  14. OSA: CPAP at night- patient is bringing his from home.  15.  Hypoalbuminemia  Supplement initiated on 6/21  LOS: 3 days A FACE TO FACE EVALUATION WAS PERFORMED  Devyn Sheerin Lorie Phenix 04/21/2020, 8:43 AM

## 2020-04-21 NOTE — Care Management (Signed)
Patient ID: Jordan [name], male   DOB: 1941-05-19, 79 y.o.   MRN: [id number] Met with patient to review role of CM and collaboration with SW Margreta Journey) to facilitate smooth discharge. Reviewed risk factors for stroke and treatments including medications initiated. Discussed DASH diet, CMM diet and given handouts for eating after a stroke and cooking with less salt; using spices, herbs, seasonings, etc. Patient reports eating only two meals a day and high carb diet prior to admission; reviewed snacks, alternatives to high carb foods he could get even at a restaurant since he doesn't do much cooking at home. Continue to follow along with nursing issues.

## 2020-04-21 NOTE — Progress Notes (Signed)
Inpatient Rehabilitation  Patient information reviewed and entered into eRehab system by Kartier Bennison M. Siddarth Hsiung, M.A., CCC/SLP, PPS Coordinator.  Information including medical coding, functional ability and quality indicators will be reviewed and updated through discharge.    

## 2020-04-21 NOTE — Progress Notes (Signed)
Inpatient Rehabilitation Care Coordinator Assessment and Plan  Patient Details  Name: Jordan [name] MRN: [id number] Date of Birth: [birth year]  Today's Date: 04/21/2020  Problem List:  Patient Active Problem List   Diagnosis Date Noted  . Hypoalbuminemia due to protein-calorie malnutrition ([place])   . Controlled type 2 diabetes mellitus with hyperglycemia, without long-term current use of insulin ([place])   . 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[place]) 04/18/2020  . Morbid obesity ([place])   . Acute blood loss anemia   . Acute thalamic infarction ([place]) 04/15/2020  . 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 TIA (transient ischemic attack) 02/17/2018  . DM2 (diabetes mellitus, type 2) ([place]) 02/17/2018  . Insomnia 02/01/2018  . 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 Dyspnea on exertion 03/03/2014  . HLD (hyperlipidemia) 12/13/2007  . Essential hypertension 12/13/2007  . OBESITY 12/05/2007  . Obstructive sleep apnea 12/05/2007  . 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 Coronary atherosclerosis 12/05/2007   Past Medical History:  Past Medical History:  Diagnosis Date  . Arthritis    R shoulder, bone spur  . Arthritis    "hands" (03/25/2016)  . Chronic lower back pain   . Coronary heart disease    Dr Pernell Dupre  . H/O cardiovascular stress test    perhaps last one was 2009  . Heart murmur    12/29/11 echo: mild MR, no AS, trivial TR  . History of gout   . Hyperlipidemia   . Hypertension    saw Dr. Linard Millers last earl;y- 2014, cardiac cath. last done ?2009, blocks seen didn't require any intervention at that point.  . Narcolepsy    [name] 04-11-97; Mean Latency 1.78min, SOREM 2  . OSA on CPAP    NPSG 12-13-98 AHI 22.7  . PONV (postoperative nausea and vomiting)   . Presence of permanent cardiac pacemaker   . Shortness of breath    with exertion   . Stroke ([place])   . Type II diabetes mellitus ([place])    Past  Surgical History:  Past Surgical History:  Procedure Laterality Date  . CARDIAC CATHETERIZATION  2009?  . EP IMPLANTABLE DEVICE N/A 03/25/2016   Procedure: Pacemaker Implant - Dual Chamber;  Surgeon: Evans Lance, MD;  Location: [facility];  Service: Cardiovascular;  Laterality: N/A;  . FRACTURE SURGERY    . INSERT / REPLACE / REMOVE PACEMAKER  03/24/2016  . LEFT AND RIGHT HEART CATHETERIZATION WITH CORONARY ANGIOGRAM N/A 06/05/2014   Procedure: LEFT AND RIGHT HEART CATHETERIZATION WITH CORONARY ANGIOGRAM;  Surgeon: Sinclair Grooms, MD;  Location: [facility] CATH [facility];  Service: Cardiovascular;  Laterality: N/A;  . NASAL FRACTURE SURGERY    . SHOULDER ARTHROSCOPY WITH ROTATOR CUFF REPAIR AND SUBACROMIAL DECOMPRESSION Right 08/16/2013   Procedure: SHOULDER ARTHROSCOPY WITH ROTATOR CUFF REPAIR AND SUBACROMIAL DECOMPRESSION;  Surgeon: [name], MD;  Location: [facility];  Service: Orthopedics;  Laterality: Right;  Right shoulder arthroscopy rotator cuff repair, subacromial decompression.  . TONSILLECTOMY     Social History:  reports that he quit smoking about 52 years ago. His smoking use included cigarettes. He has a 15.00 pack-year smoking history. He has never used smokeless tobacco. He reports current alcohol use. He reports that he does not use drugs.  Family / Support Systems Patient Roles: Spouse Spouse/Significant Other: [name] Children: 1 Son Anticipated Caregiver: spouse wont be in home 24/7, son Ability/Limitations of Caregiver: health concerns, stays in [place]  Preferred language: English Religion: Presbyterian Read: Yes Write: Yes   Abuse/Neglect Abuse/Neglect Assessment Can Be Completed: Yes Physical Abuse: Denies Verbal Abuse: Denies Sexual Abuse: Denies Exploitation of patient/patient's resources: Denies Self-Neglect: Denies  Emotional Status Pt's affect, behavior and adjustment status: no Recent Psychosocial Issues: no Psychiatric  History: no Substance Abuse History: no  Patient / Family Perceptions, Expectations & Goals Pt/Family understanding of illness & functional limitations: yes Premorbid pt/family roles/activities: Patient is primary driver, sw will set patien up with [place] ride and provide [employer] companies Pt/family expectations/goals: goal to discharge home  [employer]: None Transportation available at discharge: No. Patient primary driver for family  Discharge Planning Living Arrangements: Spouse/significant other, Children (Spouse will not be 24/7, son:) Support Systems: Children, Spouse/significant other Type of Residence: Private residence (Patient lives on 10th floor) Insurance Resources: Multimedia programmer (specify) ([employer] Medicare) Living Expenses: Rent Does the patient have any problems obtaining your medications?: No Care Coordinator Barriers to Discharge: Lack of/limited family support, Home environment access/layout Care Coordinator Barriers to Discharge Comments: Patient primary driver/caregiver, patient lives on 10th floor Care Coordinator Anticipated Follow Up Needs: HH/OP  Clinical Impression SW introduced self, explained role and process to patient. Patient understanding. Patient concerned about transportation after D/C. Sw will provided option for patient.   Dyanne Iha 04/21/2020, 1:20 PM

## 2020-04-21 NOTE — Progress Notes (Addendum)
Occupational Therapy Session Note  Patient Details  Name: Jordan [name] MRN: [id number] Date of Birth: 17-Apr-1941  Today's Date: 04/21/2020 OT Individual Time: 0800-0917 OT Individual Time Calculation (min): 77 min    Short Term Goals: Week 1:  OT Short Term Goal 1 (Week 1): STGs=LTGs due to ELOS  Skilled Therapeutic Interventions/Progress Updates:    Pt supine in bed upon OT arrival, no complaints of pain.  Pt requesting to shower in walk in this morning.  Supervision for supine to sit EOB and setup of RW needed prior to sit to stand.  Pt intermittently asking "do I really need this walker?"  OT educated pt on current functional mobility status with slight unsteadiness standing balance and benefits of RW for fall prevention.  Pt able to request all ADL items needed for bathing and dressing.  Pt stood sinkside with RW to brush teeth with supervision for RW mgt and safe placement due to pt turning away from RW with arrival of Dr. Posey Pronto.  Noted intermittent dropping of toothbrush from right hand and pt reporting he cant feel as well in right hand.  Educated pt on using visual tracking of right hand when holding items to increase feedback. Pt completed short distance ambulation sink to toilet with intermittent CGA primarily when turning and min VCs for safe RW management.  Clothing mgt and pericare completed with distant supervision.  Pt completed small space negotiation with RW toilet to shower bench with supervision.  UB and LB bathing completed with supervision with shower curtain partially pulled per pt privacy.  Functional mobility shower bench to EOB with supervision.  UB dressing completed with setup and LB dressing completed with CGA to thread shorts and underwear over feet.  SPT completed EOB to recliner with supervision.  Chair alarm on, call bell and needs in reach.    Therapy Documentation Precautions:  Precautions Precautions: Fall Restrictions Weight Bearing Restrictions: No       Therapy/Group: Individual Therapy  Ezekiel Slocumb 04/21/2020, 8:24 AM

## 2020-04-22 ENCOUNTER — Inpatient Hospital Stay (HOSPITAL_COMMUNITY): Payer: Medicare HMO | Admitting: Occupational Therapy

## 2020-04-22 ENCOUNTER — Inpatient Hospital Stay (HOSPITAL_COMMUNITY): Payer: Medicare HMO | Admitting: Physical Therapy

## 2020-04-22 DIAGNOSIS — L989 Disorder of the skin and subcutaneous tissue, unspecified: Secondary | ICD-10-CM

## 2020-04-22 LAB — GLUCOSE, CAPILLARY
Glucose-Capillary: 115 mg/dL — ABNORMAL HIGH (ref 70–99)
Glucose-Capillary: 123 mg/dL — ABNORMAL HIGH (ref 70–99)
Glucose-Capillary: 133 mg/dL — ABNORMAL HIGH (ref 70–99)
Glucose-Capillary: 166 mg/dL — ABNORMAL HIGH (ref 70–99)

## 2020-04-22 MED ORDER — AQUAPHOR EX OINT
TOPICAL_OINTMENT | Freq: Three times a day (TID) | CUTANEOUS | Status: DC
  Administered 2020-04-22: 1 via TOPICAL
  Filled 2020-04-22: qty 50
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
## 2020-04-22 NOTE — Progress Notes (Signed)
Occupational Therapy Session Note  Patient Details  Name: Jordan [name] MRN: [id number] Date of Birth: 02-11-1941  Today's Date: 04/22/2020 OT Individual Time: first session: 75 - 904; second session: 1301-1415 OT Individual Time Calculation (min): first session: 64 min; second session: 74 min   Short Term Goals: Week 1:  OT Short Term Goal 1 (Week 1): STGs=LTGs due to ELOS  Skilled Therapeutic Interventions/Progress Updates:    First session: Pt sitting up EOB with no complaints of pain. Pt reports his wife is scheduled for THR at the end of this month.    Requesting to shower this AM.  Pt completed functional mobility using RW with supervision EOB to sink for standing oral hygiene.  Pt ambulated using RW sink to shower bench and completed stand to sit with grab bar on right for support needing CGA.  Pt doffed shirt, underwear, pants, socks, and shoes with mod I sitting at shower bench.  Pt bathed UB and LB with distant supervision.   Pt noted dropping portable shower head x 2 due to impaired sensation right hand, however able to safely retrieve with mod I. Bandages on right ankle and left heel changed.  Pt needing assist to dry floor thoroughly prior to ambulation; Pt completed shower bench to recliner using RW with supervision.  Pt dressed UB with setup.  OT assisted pt with LB dressing due to time constraints.  Chair alarm on, call bell in reach.  Second Session: Pt sitting up in recliner agreeable to participating in OT; no complaints of pain.  Pt reporting difficulty with use of urinal at bed level with leakage occurring.  Pt completed stand pivot transfer with RW recliner to EOB and sit to supine with mod I.  Educated pt on proper positioning of urinal in supine and sidelying to facilitate independent use without spillage.  Also educated pt on placement of towel under urinal to increase absorption if leakage does occur. Pt reports good understanding.  Next, session focused on transfer  training at tub shower level secondary to pt reports similar home setup.  Supine to sit with mod I. Functional mobility EOB to ADL suite bathroom with CGA and intermittent VCs and TCs needed to attend to right side for obstacle negotiation.  Pt completed tub shower transfer stepping over lip of tub with use of grab bars and stand to sit on shower chair with CGA.  Discussed pt home setup, with pt reporting concern about multiple boxes impeding pathway which will inhibit safe mobility using RW.  Pt also reports limited family/friends to be able to assist in rearranging for safer environment.  Pt completed tub shower transfer out using RW and grab bars to steady with CGA.  Next BUE strengthening performed sitting EOM using 5lb free weight for biceps curls and 3 lb free weight for shoulder scaption 0-60 degrees.  Pt completed 3 x 15 reps with occasional VCs for mechanics and frequency.  Pt completed functional mobility back to room with CGA.  Seat alarm on and call bell in reach.  Pt exhibited mild right sided neglect during functional mobility this session.  Questionable safety with environmental setup at pt home.  Pt will need pathways cleared for safe discharge.    Therapy Documentation Precautions:  Precautions Precautions: Fall Restrictions Weight Bearing Restrictions: No   Therapy/Group: Individual Therapy  Ezekiel Slocumb 04/22/2020, 8:49 AM

## 2020-04-22 NOTE — Progress Notes (Signed)
Physical Therapy Session Note  Patient Details  Name: Jordan [name] MRN: [id number] Date of Birth: 23-Mar-1941  Today's Date: 04/22/2020 PT Individual Time: 0934-1030 PT Individual Time Calculation (min): 56 min   Short Term Goals: Week 1:  PT Short Term Goal 1 (Week 1): = to LTGs based on ELOS  Skilled Therapeutic Interventions/Progress Updates:    Patient received up in recliner, more fatigued today but willing to participate in session. Able to complete all functional transfers with RW/S and gait multiple distances of 150-242f with RW and S again with much improved gait pattern with use of device. Spent quite a bit of time working on functional balance skills on blue foam pad including tandem stance, heel/toe raises, marching, random external perturbations, and forward cone taps with Min-ModA to maintain balance, followed by ongoing gait training with RW as well as 8 minutes on Nustep with BUEs/BLEs on resistance 4 with target pace of 70-80SPM. Also discussed exercise routine following DC as well as HR zones and benefits of long term exercise but deferred to following MD for specific guidelines following DC. He stated today that his wife is having hip surgery at the end of the month, discussed benefits of HHPT and OP PT at this point. Left up in recliner with all needs met and chair alarm active.   Therapy Documentation Precautions:  Precautions Precautions: Fall Restrictions Weight Bearing Restrictions: No    Pain: Pain Assessment Pain Scale: 0-10 Pain Score: 0-No pain    Therapy/Group: Individual Therapy   KWindell Norfolk DPT, PN1   Supplemental Physical Therapist [place]   Pager [phone] Rehab Office [phone]   04/22/2020, 12:25 PM

## 2020-04-22 NOTE — Plan of Care (Signed)
  Problem: RH SAFETY Goal: RH STG ADHERE TO SAFETY PRECAUTIONS W/ASSISTANCE/DEVICE Description: STG Adhere to Safety Precautions With Assistance/Device. Mod I Outcome: Progressing

## 2020-04-23 ENCOUNTER — Inpatient Hospital Stay (HOSPITAL_COMMUNITY): Payer: Medicare HMO | Admitting: Occupational Therapy

## 2020-04-23 ENCOUNTER — Inpatient Hospital Stay (HOSPITAL_COMMUNITY): Payer: Medicare HMO | Admitting: Physical Therapy

## 2020-04-23 DIAGNOSIS — I10 Essential (primary) hypertension: Secondary | ICD-10-CM

## 2020-04-23 LAB — GLUCOSE, CAPILLARY
Glucose-Capillary: 120 mg/dL — ABNORMAL HIGH (ref 70–99)
Glucose-Capillary: 158 mg/dL — ABNORMAL HIGH (ref 70–99)
Glucose-Capillary: 140 mg/dL — ABNORMAL HIGH (ref 70–99)
Glucose-Capillary: 135 mg/dL — ABNORMAL HIGH (ref 70–99)

## 2020-04-23 NOTE — Progress Notes (Signed)
[facility] PHYSICAL MEDICINE & REHABILITATION PROGRESS NOTE   Subjective/Complaints: Patient seen sitting up in bed this AM.  He states he slept well overnight. He has questions regarding coughing with laughter, which has increased since his stroke.   ROS: Denies CP, SOB, N/V/D  Objective:   No results found. Recent Labs    04/21/20 0651  WBC 8.6  HGB 14.3  HCT 44.1  PLT 263   Recent Labs    04/21/20 0651  NA 139  K 4.0  CL 107  CO2 21*  GLUCOSE 129*  BUN 18  CREATININE 1.02  CALCIUM 9.5    Intake/Output Summary (Last 24 hours) at 04/23/2020 0836 Last data filed at 04/23/2020 0730 Gross per 24 hour  Intake 594 ml  Output 650 ml  Net -56 ml     Physical Exam: Vital Signs Blood pressure (!) 148/94, pulse 75, temperature 97.8 F (36.6 C), temperature source Oral, resp. rate 18, height 5\' 8"  (1.727 m), weight 103.9 kg, SpO2 96 %. Constitutional: No distress . Vital signs reviewed. HENT: Normocephalic.  Atraumatic. Eyes: EOMI. No discharge. Cardiovascular: No JVD. Respiratory: Normal effort.  No stridor. GI: Non-distended. Skin: Palm lesions improving Psych: Normal mood.  Normal behavior. Musc: No edema in extremities.  No tenderness in extremities. Neurological: Alert Motor: LUE/LLE: 5/5 proximal distal RUE/RLE: 4+-5/5 proximal distal, unchanged   Assessment/Plan: 1. 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  Physiatrist is providing close team supervision and 24 hour management of active medical problems listed below.  Physiatrist and rehab team continue to assess barriers to discharge/monitor patient progress toward functional and medical goals  Care Tool:  Bathing    Body parts bathed by patient: Right arm, Left arm, Chest, Abdomen, Front perineal area, Buttocks, Right upper leg, Left upper leg, Right lower leg, Left lower leg, Face          Bathing assist Assist Level: Supervision/Verbal cueing     Upper Body Dressing/Undressing Upper body dressing   What is the patient wearing?: Pull over shirt    Upper body assist Assist Level: Set up assist    Lower Body Dressing/Undressing Lower body dressing      What is the patient wearing?: Underwear/pull up, Pants     Lower body assist Assist for lower body dressing: Contact Guard/Touching assist     Toileting Toileting Toileting Activity did not occur (Clothing management and hygiene only): N/A (no void or bm)  Toileting assist Assist for toileting: Supervision/Verbal cueing     Transfers Chair/bed transfer  Transfers assist     Chair/bed transfer assist level: Supervision/Verbal cueing     Locomotion Ambulation   Ambulation assist      Assist level: Supervision/Verbal cueing Assistive device: Walker-rolling Max distance: 215ft   Walk 10 feet activity   Assist     Assist level: Supervision/Verbal cueing Assistive device: Walker-rolling   Walk 50 feet activity   Assist    Assist level: Supervision/Verbal cueing Assistive device: Walker-rolling    Walk 150 feet activity   Assist Walk 150 feet activity did not occur: Safety/medical concerns  Assist level: Supervision/Verbal cueing Assistive device: Walker-rolling    Walk 10 feet on uneven surface  activity   Assist     Assist level: Minimal Assistance - Patient > 75% (ramp) Assistive device: Other (comment) (none)   Wheelchair     Assist Will patient use wheelchair at discharge?: No  Wheelchair 50 feet with 2 turns activity    Assist            Wheelchair 150 feet activity     Assist          Blood pressure (!) 148/94, pulse 75, temperature 97.8 F (36.6 C), temperature source Oral, resp. rate 18, height 5\' 8"  (1.727 m), weight 103.9 kg, SpO2 96 %.  Medical Problem List and Plan: 1.  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  Continue CIR  Team conference today to discuss current and goals and coordination of care, home and environmental barriers, and discharge planning with nursing, case manager, and therapies.  2.  Antithrombotics: -DVT/anticoagulation:  Pharmaceutical: Lovenox stopped as ambulating >150 feet.              -antiplatelet therapy: DAPT.  3. Pain Management: N/A.  4. Mood: LCSW to follow for evaluation and support.              -antipsychotic agents: N/A 5. Neuropsych: This patient is capable of making decisions on his own behalf. 6. Skin/Wound Care: routine pressure relief measures.  7. Fluids/Electrolytes/Nutrition: Monitor I/Os.    BMP within acceptable range, except glucose on 6/21 8. HTN: Monitor BP   off lisinopril at this time.   Labile on 6/23             Monitor with increased mobility and with medications as necessary. 9.T2DM with hyperglycemia: Hgb A1C- 7.9. Resumed metformin as patient not interested in insulin.   Slightly labile on 6/23  See #13             Monitor with increased mobility. 10. Dyslipidemia: Continue Lipitor. 11. 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  13. Morbid obesity: Encouraged weight loss. Patient interested in losing weight and thereby decreasing his symptoms of OSA.  Has been educated regarding low carb diet and decreased snacking and provided handouts.  14. OSA: CPAP at night- patient is bringing his from home.  15.  Hypoalbuminemia  Supplement initiated on 6/21 16.  Skin lesions in bilateral volar hands  Continue alternate between Aquaphor and steroid cream  LOS: 5 days A FACE TO FACE EVALUATION WAS PERFORMED  Lyberti Thrush Lorie Phenix 04/23/2020, 8:36 AM

## 2020-04-23 NOTE — Progress Notes (Signed)
Physical Therapy Session Note  Patient Details  Name: Jordan [name] MRN: [id number] Date of Birth: 07-06-1941  Today's Date: 04/23/2020 PT Individual Time: 0955-1055 PT Individual Time Calculation (min): 60 min   Short Term Goals: Week 1:  PT Short Term Goal 1 (Week 1): = to LTGs based on ELOS  Skilled Therapeutic Interventions/Progress Updates: Pt presented in recliner agreeable to therapy. Pt denies pain during session. Pt ambulated to rehab gym with RW and supervision. Pt participated in dynamic balance activities including use of rebounder with 1Kg weighted ball x 20 forward then L/R for trunk rotation. Pt also performed toe taps to 6in step without AD x 15 bilaterally with above activities performed with CGA. Pt then participated in placing clothespins on basketball net with contralateral LE on 6in step. Pt was able to perform reaching with RUE (LLE on step) with CGA however x1 posterior LOB when reaching with LUE (RLE on step). Pt introduced to OTAGO B exercises with pt instructed to perform with supervision and was able to perform all safely with verbal cues for technique. Pt ambulated back to room at end of session in same manner as prior and returned to recliner. Pt left in chair at end of session with seat alarm on, call bell within reach and needs met.      Therapy Documentation Precautions:  Precautions Precautions: Fall Restrictions Weight Bearing Restrictions: No General:   Vital Signs: Therapy Vitals Temp: 98 F (36.7 C) Temp Source: Oral Pulse Rate: 70 Resp: 17 BP: 115/75 Patient Position (if appropriate): Sitting Oxygen Therapy SpO2: 99 % O2 Device: Room Air   Therapy/Group: Individual Therapy  Nandan Willems 04/23/2020, 5:29 PM

## 2020-04-23 NOTE — Progress Notes (Signed)
Occupational Therapy Session Note  Patient Details  Name: Jordan [name] MRN: [id number] Date of Birth: November 15, 1940  Today's Date: 04/23/2020 OT Individual Time: 1350-1443 OT Individual Time Calculation (min): 53 min    Short Term Goals: Week 1:  OT Short Term Goal 1 (Week 1): STGs=LTGs due to ELOS  Skilled Therapeutic Interventions/Progress Updates:    1:1. Pt received in bed agreeable to OT. Pt completes all mobility to/from room with RW and VC for R attention, upright posture, steering RW (R preference) and keeping feet inside RW. Pt completes seated and standing (regualr, narrow and staggered stance) [place] activity reaching laterally/across midline to obtain pushpins with BUE and place onto board based on color. Pt with minimal grip slips (25% of time) on pushpins in RUE. Pt completes finger<>palm translation of coins at table top with increased grip slip attempting to stack coins onto tabletop demoing poor [place]. Exited session with pt seated in recliner, exi talarm on and call light in reach  Therapy Documentation Precautions:  Precautions Precautions: Fall Restrictions Weight Bearing Restrictions: No General:   Vital Signs:  Pain:   ADL: ADL Eating: Not assessed Grooming: Supervision/safety Where Assessed-Grooming: Standing at sink Upper Body Bathing: Setup Where Assessed-Upper Body Bathing: Shower Lower Body Bathing: Supervision/safety Where Assessed-Lower Body Bathing: Shower Upper Body Dressing: Setup Where Assessed-Upper Body Dressing: Edge of bed Lower Body Dressing: Contact guard Where Assessed-Lower Body Dressing: Edge of bed Toileting: Not assessed Toilet Transfer: Therapist, music Method: Ambulating (without AD) Science writer: Emergency planning/management officer Transfer: Curator Method: Ambulating (without AD) Youth worker: Nurse, learning disability    Praxis   Exercises:   Other  Treatments:     Therapy/Group: Individual Therapy  Tonny Branch 04/23/2020, 2:00 PM

## 2020-04-23 NOTE — Progress Notes (Signed)
Occupational Therapy Session Note  Patient Details  Name: Jordan [name] MRN: [id number] Date of Birth: Jan 27, 1941  Today's Date: 04/23/2020 OT Individual Time:first session: 9-945; second session; 1130-1200 OT Individual Time Calculation (min): first session: 45 min; second session: 30 min    Short Term Goals: Week 1:  OT Short Term Goal 1 (Week 1): STGs=LTGs due to ELOS  Skilled Therapeutic Interventions/Progress Updates:   First session:  Pt supine in bed with no complaints of pain.  Pt wanting to shower and brush his teeth this AM.  Pt completed supine to sit with independence.  Pt ambulated EOB to sink with supervision using RW and setup for oral hygiene in standing.  Pt ambulated sink to shower bench with supervision.  Pt bathed UB and LB with distant supervision, noting pt dropping portable shower head x 2 occasions and able to retrieve with CGA from floor surface.  Pt ambulated from shower bench to recliner with supervision using RW.  Pt donned shirt with setup, donned brief with min assist to manage tabs, and setup to donn pants, socks, and shoes.  Call bell in reach, seat alarm on.  Second session: Pt sitting in recliner agreeable to working with OT with no c/o pain.  Pt reporting concerns regarding ability to pay a bill while he is in CIR and will need to ask his wife to assist.  Discussed dc planning recommendations, including need for rearranging home environment to ensure clear pathways exist throughout home, and need for supervision due to slight unsteady balance and mild right sided neglect placing pt at risk for falls.  Pt participated in dynamic standing and functional mobility activity with right sided obstacle negotiation component.  Pt required supervision with intermittent VCs and CGA to avoid right obstacles x 5 occasions while ambulating in hallway, through gym, and in room using RW.  Pt completed stand to sit with supervision at recliner. Chair alarm on, call bell in  reach.  Therapy Documentation Precautions:  Precautions Precautions: Fall Restrictions Weight Bearing Restrictions: No   Therapy/Group: Individual Therapy  Ezekiel Slocumb 04/23/2020, 4:22 PM

## 2020-04-23 NOTE — Patient Care Conference (Signed)
Inpatient RehabilitationTeam Conference and Plan of Care Update Date: 04/23/2020   Time: 2:42 PM    Patient Name: Jordan [name]      Medical Record Number: [id number]  Date of Birth: [birth year] Sex: Male         Room/Bed: 4M03C/4M03C-01 Payor Info: Payor: [employer] MEDICARE / Plan: [place] HMO / Product Type: *No Product type* /    Admit Date/Time:  04/18/2020  3:19 PM  Primary Diagnosis:  Thalamic stroke [facility])  Patient Active Problem List   Diagnosis Date Noted  . Benign essential HTN   . Skin lesion of hand   . Hypoalbuminemia due to protein-calorie malnutrition ([place])   . Controlled type 2 diabetes mellitus with hyperglycemia, without long-term current use of insulin ([place])   . 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[place]) 04/18/2020  . Morbid obesity ([place])   . Acute blood loss anemia   . Acute thalamic infarction ([place]) 04/15/2020  . 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 TIA (transient ischemic attack) 02/17/2018  . DM2 (diabetes mellitus, type 2) ([place]) 02/17/2018  . Insomnia 02/01/2018  . 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 Dyspnea on exertion 03/03/2014  . HLD (hyperlipidemia) 12/13/2007  . Essential hypertension 12/13/2007  . OBESITY 12/05/2007  . Obstructive sleep apnea 12/05/2007  . 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 Coronary atherosclerosis 12/05/2007    Expected Discharge Date: Expected Discharge Date: 05/01/20  Team Members Present: Physician leading conference: Dr. Delice Lesch Care Coodinator Present: Dorien Chihuahua, RN, BSN, CRRN;Christina Sampson Goon, [place] Nurse Present: Debroah Loop, RN PT Present: Barrie Folk, PT OT Present: Leretha Pol, OT SLP Present: Jettie Booze, CF-SLP PPS Coordinator present : Gunnar Fusi, SLP     Current Status/Progress Goal Weekly Team Focus  Bowel/Bladder   Continent of both, LBM charted is 6/19 (per pt 6/22)  Remain continent, enocurage  PRN bowel meds  Assess skin q shift and prn   Swallow/Nutrition/ Hydration             ADL's   CGA to supervision for functional transfers and BADLs  setup to independent with assistive device  ADL training, compensatory technique training, functional transfers and standing balance   Mobility   up to MinA bed mobilty, S for functional transfers and gait 271ft with RW. Needs to use RW for now or is a very high fall risk. Still wanting to get out ASAP but does NOT have safe DC situation.  mod(I) overall  gait, balance, aerobic activity, transfer training, AD   Communication             Safety/Cognition/ Behavioral Observations            Pain   No c/o pain  Remain pain free  Assess pain q shift and prn   Skin   Generalized dry/flaky skin. Has lesions on hand w/ scheduled ointment  Prevent further skin breakdown  Assess skin q shift and prn    Rehab Goals Patient on target to meet rehab goals: Yes Rehab Goals Revised: Patient of target for current goals *See Care Plan and progress notes for long and short-term goals.     Barriers to Discharge  Current Status/Progress Possible Resolutions Date Resolved   Nursing                  PT  Decreased caregiver support;Home environment access/layout  high fall risk without RW, wife having surgery at end of June, transportation issues in terms of  getting to OP PT. Refusing HHPT as condo is "cluttered" and there are no walkways but this is super unsafe for him to go home witha wife getting surgery, an autistic son, and him with R inattention.              OT Medical stability                SLP                Care Coordinator Lack of/limited family support;Home environment access/layout;Decreased caregiver support Patient son lives in home (asbergers, spouse unavaliable 24/7, patient lives on 10th floor) on target with goals          Discharge Planning/Teaching Needs:  Patient plans to discharge home with son ( son, 1 has asbergers,  spouse lives in [place] to provide care to family, spouse also has hip surget scheduled 6/20)      Team Discussion:  Patient demonstrates right neglect; staff concerned for safety at discharge. 24/7 care recommended with limited resources at the home. Son unable to provide assistance and wife with medical issues of her own. Patient was the primary driver for the family PTA. Also noted patient requires supervision for ambulation with a walker and  min-mod assist for balance when ambulating without a AD. Patient reports cannot use walker in home due to clutter/furniture = limited space for ambulation and doorframe is too small to accommodate a walker. SW to follow up with significant other/family regarding discharge recommendations; discharge set for 05/01/20.  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 Weekly Focus/Goal: Improve mobility, skin lesions, CBGs, BP  Barriers to Discharge: Medical stability;Decreased family/caregiver support;Home enviroment access/layout;Weight   Possible Resolutions to Barriers: Therapies, optimize BP/DM meds   Continued Need for Acute Rehabilitation Level of Care: The patient requires daily medical management by a physician with specialized training in physical medicine and rehabilitation for the following reasons: Direction of a multidisciplinary physical rehabilitation program to maximize functional independence : Yes Medical management of patient stability for increased activity during participation in an intensive rehabilitation regime.: Yes Analysis of laboratory values and/or radiology reports with any subsequent need for medication adjustment and/or medical intervention. : Yes   I attest that I was present, lead the team conference, and concur with the assessment and plan of the team.   Dorien Chihuahua B 04/23/2020, 2:42 PM

## 2020-04-23 NOTE — Progress Notes (Signed)
Patient ID: Jordan [name], male   DOB: 1941-10-08, 79 y.o.   MRN: [id number]   Team Conference Report to Patient/Family  Team Conference discussion was reviewed with the patient and caregiver, including goals, any changes in plan of care and target discharge date.  Patient and caregiver express understanding and are in agreement.  The patient has a target discharge date of 05/01/20. SW spoke with spouse. Spouse will be returning from [place] on Thursday to cleaned boxes from home. Spouse also will be postponing her surgery.  Dyanne Iha 04/23/2020, 1:31 PM

## 2020-04-23 NOTE — Plan of Care (Signed)
  Problem: RH SKIN INTEGRITY Goal: RH STG SKIN FREE OF INFECTION/BREAKDOWN Description: Remain free of breakdown and infection withmin assist Outcome: Progressing   Problem: RH SAFETY Goal: RH STG ADHERE TO SAFETY PRECAUTIONS W/ASSISTANCE/DEVICE Description: STG Adhere to Safety Precautions With Assistance/Device. Mod I Outcome: Progressing   Problem: RH KNOWLEDGE DEFICIT Goal: RH STG INCREASE KNOWLEDGE OF DIABETES Description: Patient will be able to manage DM and secondary stroke risks independently using handouts and educational resources Outcome: Progressing

## 2020-04-23 NOTE — Progress Notes (Signed)
Patient is already on CPAP HS. No assistance needed.

## 2020-04-24 ENCOUNTER — Inpatient Hospital Stay (HOSPITAL_COMMUNITY): Payer: Medicare HMO | Admitting: Occupational Therapy

## 2020-04-24 ENCOUNTER — Inpatient Hospital Stay (HOSPITAL_COMMUNITY): Payer: Medicare HMO | Admitting: Physical Therapy

## 2020-04-24 LAB — GLUCOSE, CAPILLARY
Glucose-Capillary: 121 mg/dL — ABNORMAL HIGH (ref 70–99)
Glucose-Capillary: 117 mg/dL — ABNORMAL HIGH (ref 70–99)
Glucose-Capillary: 161 mg/dL — ABNORMAL HIGH (ref 70–99)
Glucose-Capillary: 182 mg/dL — ABNORMAL HIGH (ref 70–99)

## 2020-04-24 NOTE — Progress Notes (Signed)
Placed patient on CPAP for HS use, via patient home nasal mask. Previous settings.

## 2020-04-24 NOTE — Progress Notes (Signed)
[facility] PHYSICAL MEDICINE & REHABILITATION PROGRESS NOTE   Subjective/Complaints: Patient seen sitting up in bed this morning.  He states he slept well overnight.  Patient states he is not happy that his discharge date is so far out, but understanding.  Discussed barriers to discharge with patient.  Patient states that his wife's surgery will be postponed as he will be the main caretaker for her.  When inquired about wife's functional capacity, patient states that wife is able to do majority of things, just at a slower pace.  He also notes that although his son is on the autism spectrum, he is high functioning and can be directed to situate his condominium, so that it is safe for patient to be discharged.  Instructed patient that if his discharge support as well as home set up can be modified he could be discharged sooner.  Discussed with PA and with plans to discuss with Education officer, museum.  ROS: Denies CP, SOB, N/V/D  Objective:   No results found. No results for input(s): WBC, HGB, HCT, PLT in the last 72 hours. No results for input(s): NA, K, CL, CO2, GLUCOSE, BUN, CREATININE, CALCIUM in the last 72 hours.  Intake/Output Summary (Last 24 hours) at 04/24/2020 0913 Last data filed at 04/24/2020 0750 Gross per 24 hour  Intake 620 ml  Output 350 ml  Net 270 ml     Physical Exam: Vital Signs Blood pressure 129/73, pulse 64, temperature 97.8 F (36.6 C), resp. rate 18, height 5\' 8"  (1.727 m), weight 103.7 kg, SpO2 91 %. Constitutional: No distress . Vital signs reviewed. HENT: Normocephalic.  Atraumatic. Eyes: EOMI. No discharge. Cardiovascular: No JVD. Respiratory: Normal effort.  No stridor. GI: Non-distended. Skin: Palm lesions improving, right nearly resolved Psych: Normal mood.  Normal behavior. Musc: No edema in extremities.  No tenderness in extremities. Neurological: Alert Motor: LUE/LLE: 5/5 proximal distal RUE/RLE: 4+-5/5 proximal distal, stable  Assessment/Plan: 1.  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  Physiatrist is providing close team supervision and 24 hour management of active medical problems listed below.  Physiatrist and rehab team continue to assess barriers to discharge/monitor patient progress toward functional and medical goals  Care Tool:  Bathing    Body parts bathed by patient: Right arm, Left arm, Chest, Abdomen, Front perineal area, Buttocks, Right upper leg, Left upper leg, Right lower leg, Left lower leg, Face         Bathing assist Assist Level: Supervision/Verbal cueing     Upper Body Dressing/Undressing Upper body dressing   What is the patient wearing?: Pull over shirt    Upper body assist Assist Level: Set up assist    Lower Body Dressing/Undressing Lower body dressing      What is the patient wearing?: Incontinence brief, Pants     Lower body assist Assist for lower body dressing: Minimal Assistance - Patient > 75%     Toileting Toileting Toileting Activity did not occur (Clothing management and hygiene only): N/A (no void or bm)  Toileting assist Assist for toileting: Supervision/Verbal cueing     Transfers Chair/bed transfer  Transfers assist     Chair/bed transfer assist level: Supervision/Verbal cueing     Locomotion Ambulation   Ambulation assist      Assist level: Supervision/Verbal cueing Assistive device: Walker-rolling Max distance: 258ft   Walk 10 feet activity   Assist     Assist level: Supervision/Verbal cueing  Assistive device: Walker-rolling   Walk 50 feet activity   Assist    Assist level: Supervision/Verbal cueing Assistive device: Walker-rolling    Walk 150 feet activity   Assist Walk 150 feet activity did not occur: Safety/medical concerns  Assist level: Supervision/Verbal cueing Assistive device: Walker-rolling    Walk 10 feet on  uneven surface  activity   Assist     Assist level: Minimal Assistance - Patient > 75% (ramp) Assistive device: Other (comment) (none)   Wheelchair     Assist Will patient use wheelchair at discharge?: No             Wheelchair 50 feet with 2 turns activity    Assist            Wheelchair 150 feet activity     Assist          Blood pressure 129/73, pulse 64, temperature 97.8 F (36.6 C), resp. rate 18, height 5\' 8"  (1.727 m), weight 103.7 kg, SpO2 91 %.  Medical Problem List and Plan: 1.  Deficits with mobility, endurance, balance, self-care secondary to acute left thalamic infarct.  Continue CIR 2.  Antithrombotics: -DVT/anticoagulation:  Pharmaceutical: Lovenox stopped as ambulating >150 feet.              -antiplatelet therapy: DAPT.  3. Pain Management: N/A.  4. Mood: LCSW to follow for evaluation and support.              -antipsychotic agents: N/A 5. Neuropsych: This patient is capable of making decisions on his own behalf. 6. Skin/Wound Care: routine pressure relief measures.  7. Fluids/Electrolytes/Nutrition: Monitor I/Os.    BMP within acceptable range, but?  Trending up on 6/21, except glucose on 6/21 8. HTN: Monitor BP   off lisinopril at this time.   Labile on 6/24, monitor for trend             Monitor with increased mobility and with medications as necessary. 9.T2DM with hyperglycemia: Hgb A1C- 7.9. Resumed metformin as patient not interested in insulin.   Slightly labile on 6/24, will consider additional medication tomorrow if persistently elevated  See #13             Monitor with increased mobility. 10. Dyslipidemia: Continue Lipitor. 11. 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  13. Morbid obesity: Encouraged weight loss. Patient interested in losing weight and thereby decreasing his symptoms of OSA.  Has been educated regarding low carb diet and decreased snacking and provided handouts.  14.  OSA: CPAP at night- patient is bringing his from home.  15.  Hypoalbuminemia  Supplement initiated on 6/21 16.  Skin lesions in bilateral volar hands  Continue alternate between Aquaphor and steroid cream  Improving  LOS: 6 days A FACE TO FACE EVALUATION WAS PERFORMED  [place] Lorie Phenix 04/24/2020, 9:13 AM

## 2020-04-24 NOTE — Progress Notes (Signed)
Patient ID: Jordan [name], male   DOB: 03/21/1941, 79 y.o.   MRN: [id number]   Vassie Moselle ordered

## 2020-04-24 NOTE — Progress Notes (Addendum)
Occupational Therapy Session Note  Patient Details  Name: Jordan [name] MRN: [id number] Date of Birth: 01/24/1941  Today's Date: 04/24/2020 OT Individual Time: first session: [id number]; Second session: [phone] OT Individual Time Calculation (min): first session: 60 min; second session: 30 min    Short Term Goals: Week 1:  OT Short Term Goal 1 (Week 1): STGs=LTGs due to ELOS  Skilled Therapeutic Interventions/Progress Updates:   First session: Pt supine in bed no complaints of pain, reports he showered this AM with therapy already.  Requesting to brush his teeth and reporting interest in playing piano.  Pt completed supine to sit and ambulated to sink with setup of RW and distant supervision.  Pt brushed teeth and combed hair standing sinkside with mod I.  Pt completed functional mobility with RW to Day Room with 2 occasions of neglecting right sided obstacle needing Min VCs and TCs to attend and avoid.  Pt setup at keyboard piano at proper height and distance and oriented to controls.  Pt played various songs of his choice initially noting mild to moderate impairments in fine motor control and coordination in right hand compared to left.  Instructed pt through using visual feedback and focused attention to right hand while playing, slowing pace of play, and instructed pt in repetitive isolated chords with right hand to facilitate increased control and right sided attention.  Pt exhibited increased precision with right hand during follow through of these techniques.  Pt completed functional mobility using RW to return to room with supervision with one occasion needing VCs and TCs to avoid obstacle on right.  Pt completed stand to sit with distant supervision. Call bell in reach, seat alarm on. Second session:  Pt sitting up in recliner, just finished lunch, no complaints of pain.  Pt asking OT regarding pt autonomy.  OT educated pt on occupational therapy scope to make recommendations and to educate,  and pt has the right to autonomy and choice in all aspects of his life.  Pt reports good understanding.  Pt completed functional mobility to gym with RW with supervision with one LOB episode needing CGA to recover secondary to right sided neglect and running into obstacle on right.  Pt participated in fine motor table top activity using pill bottles and various sized and shaped small beads to facilitate right in hand translation finger<>palm and precision 3 jaw chuck placement. Pt exhibited minor difficulty with in hand translation in right hand, however with good precision placement. Provided fine motor activity of lacing paperclips and translating pen in right hand to complete after OT session in room.  Pt returned to room using RW with no LOB noted and good obstacle negotiating.  Call bell in reach, seat alarm on.  Therapy Documentation Precautions:  Precautions Precautions: Fall Restrictions Weight Bearing Restrictions: No   Therapy/Group: Individual Therapy  Ezekiel Slocumb 04/24/2020, 3:08 PM

## 2020-04-24 NOTE — Progress Notes (Signed)
Patient ID: Jordan [name], male   DOB: 03/02/1941, 79 y.o.   MRN: [id number]  Sw received message from [employer]:   "Good Afternoon! We have attempted to contact your patient for payment due today of $15.74.  We were unable to reach the patient for payment.  Delivery will occur after payment can be collected.  Patient or family member can call [phone] or [phone] to provide [place] with information needed."

## 2020-04-24 NOTE — Progress Notes (Signed)
Occupational Therapy Session Note  Patient Details  Name: DARIOUS REHMAN MRN: [id number] Date of Birth: 24-Dec-1940  Today's Date: 04/24/2020 OT Individual Time: 0830-0930 OT Individual Time Calculation (min): 60 min    Short Term Goals: Week 1:  OT Short Term Goal 1 (Week 1): STGs=LTGs due to ELOS  Skilled Therapeutic Interventions/Progress Updates:      Pt seen for BADL retraining of toileting, bathing, and dressing with a focus on balance.  Pt did very well today moving at a safe controlled pace.  He did not need any physical A EXCEPT for slight A with donning feet into his pant legs. He was able to place R foot in but then L foot was difficult.   Good activity tolerance and ability to stand.  Supervision overall today.  Discussed preparing for him to go home soon. Pt resting in bed with all needs met.    Therapy Documentation Precautions:  Precautions Precautions: Fall Restrictions Weight Bearing Restrictions: No    Vital Signs: Therapy Vitals Temp: 97.8 F (36.6 C) Pulse Rate: 64 Resp: 18 BP: 129/73 Patient Position (if appropriate): Lying Oxygen Therapy SpO2: 91 % O2 Device: CPAP Pain:   No c/o pain  Therapy/Group: Individual Therapy  [place] 04/24/2020, 8:30 AM

## 2020-04-24 NOTE — Progress Notes (Signed)
Physical Therapy Session Note  Patient Details  Name: Jordan [name] MRN: [id number] Date of Birth: [birth year]  Today's Date: 04/24/2020 PT Individual Time: 1405-1500 PT Individual Time Calculation (min): 55 min   Short Term Goals: Week 1:  PT Short Term Goal 1 (Week 1): = to LTGs based on ELOS  Skilled Therapeutic Interventions/Progress Updates:    Patient received up in recliner, pleasant and willing to participate in PT today. Able to perform functional transfers with S/RW with cues for hand placement and safety, then tolerated gait training multiple distances of approximately 150-231f with RW/S with cues for safe proximity from device but much more improved gait pattern and balance. Focused session on balance training including horizontal and vertical shaking of body blade while standing in narrow BOS and tandem stance on blue foam pad, heel raises and marches on blue foam pad, and tandem walking in parallel bars. Otherwise continued progressing Nustep on resistance 5 with BUEs/BLEs for 8 minutes at 70-80SPM. Left sitting up in recliner with all needs met, chair alarm active and all needs otherwise met.   Therapy Documentation Precautions:  Precautions Precautions: Fall Restrictions Weight Bearing Restrictions: No Pain: Pain Assessment Pain Scale: 0-10 Pain Score: 0-No pain    Therapy/Group: Individual Therapy   KWindell Norfolk DPT, PN1   Supplemental Physical Therapist CKent   Pager [phone] Rehab Office [phone]   04/24/2020, 3:45 PM

## 2020-04-25 ENCOUNTER — Inpatient Hospital Stay (HOSPITAL_COMMUNITY): Payer: Medicare HMO | Admitting: Occupational Therapy

## 2020-04-25 ENCOUNTER — Inpatient Hospital Stay (HOSPITAL_COMMUNITY): Payer: Medicare HMO | Admitting: Physical Therapy

## 2020-04-25 DIAGNOSIS — R059 Cough, unspecified: Secondary | ICD-10-CM

## 2020-04-25 DIAGNOSIS — R7309 Other abnormal glucose: Secondary | ICD-10-CM | POA: Insufficient documentation

## 2020-04-25 DIAGNOSIS — R05 Cough: Secondary | ICD-10-CM

## 2020-04-25 LAB — GLUCOSE, CAPILLARY
Glucose-Capillary: 113 mg/dL — ABNORMAL HIGH (ref 70–99)
Glucose-Capillary: 112 mg/dL — ABNORMAL HIGH (ref 70–99)
Glucose-Capillary: 164 mg/dL — ABNORMAL HIGH (ref 70–99)
Glucose-Capillary: 193 mg/dL — ABNORMAL HIGH (ref 70–99)

## 2020-04-25 MED ORDER — TERBINAFINE HCL 1 % EX CREA
TOPICAL_CREAM | Freq: Two times a day (BID) | CUTANEOUS | Status: DC
  Filled 2020-04-25: qty 12

## 2020-04-25 NOTE — Progress Notes (Signed)
[facility] PHYSICAL MEDICINE & REHABILITATION PROGRESS NOTE   Subjective/Complaints: Patient seen ambulating and later sitting with therapies.  He states he slept well overnight.  Discussed updated discharge date with patient and therapies.  Patient again complains of cough with laughter, now states he has some congestion.  ROS: Denies CP, SOB, N/V/D  Objective:   No results found. No results for input(s): WBC, HGB, HCT, PLT in the last 72 hours. No results for input(s): NA, K, CL, CO2, GLUCOSE, BUN, CREATININE, CALCIUM in the last 72 hours.  Intake/Output Summary (Last 24 hours) at 04/25/2020 0905 Last data filed at 04/25/2020 0813 Gross per 24 hour  Intake 620 ml  Output 525 ml  Net 95 ml     Physical Exam: Vital Signs Blood pressure 140/76, pulse 65, temperature 98 F (36.7 C), resp. rate 20, height 5\' 8"  (1.727 m), weight 103.7 kg, SpO2 95 %. Constitutional: No distress . Vital signs reviewed. HENT: Normocephalic.  Atraumatic. Eyes: EOMI. No discharge. Cardiovascular: No JVD. Respiratory: Normal effort.  No stridor. GI: Non-distended. Skin: Lesions on bilateral palms Psych: Normal mood.  Normal behavior. Musc: No edema in extremities.  No tenderness in extremities. Neurological: Alert Motor: LUE/LLE: 5/5 proximal distal RUE/RLE: 4+-5/5 proximal distal, unchanged  Assessment/Plan: 1. 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  Physiatrist is providing close team supervision and 24 hour management of active medical problems listed below.  Physiatrist and rehab team continue to assess barriers to discharge/monitor patient progress toward functional and medical goals  Care Tool:  Bathing    Body parts bathed by patient: Right arm, Left arm, Chest, Abdomen, Front perineal area, Buttocks, Right upper leg, Left upper leg, Right lower leg, Left lower leg, Face          Bathing assist Assist Level: Supervision/Verbal cueing     Upper Body Dressing/Undressing Upper body dressing   What is the patient wearing?: Pull over shirt    Upper body assist Assist Level: Set up assist    Lower Body Dressing/Undressing Lower body dressing      What is the patient wearing?: Underwear/pull up, Pants     Lower body assist Assist for lower body dressing: Minimal Assistance - Patient > 75%     Toileting Toileting Toileting Activity did not occur (Clothing management and hygiene only): N/A (no void or bm)  Toileting assist Assist for toileting: Supervision/Verbal cueing     Transfers Chair/bed transfer  Transfers assist     Chair/bed transfer assist level: Supervision/Verbal cueing     Locomotion Ambulation   Ambulation assist      Assist level: Supervision/Verbal cueing Assistive device: Walker-rolling Max distance: 268ft   Walk 10 feet activity   Assist     Assist level: Supervision/Verbal cueing Assistive device: Walker-rolling   Walk 50 feet activity   Assist    Assist level: Supervision/Verbal cueing Assistive device: Walker-rolling    Walk 150 feet activity   Assist Walk 150 feet activity did not occur: Safety/medical concerns  Assist level: Supervision/Verbal cueing Assistive device: Walker-rolling    Walk 10 feet on uneven surface  activity   Assist     Assist level: Minimal Assistance - Patient > 75% (ramp) Assistive device: Other (comment) (none)   Wheelchair     Assist Will patient use wheelchair at discharge?: No             Wheelchair 50 feet with 2 turns  activity    Assist            Wheelchair 150 feet activity     Assist          Blood pressure 140/76, pulse 65, temperature 98 F (36.7 C), resp. rate 20, height 5\' 8"  (1.727 m), weight 103.7 kg, SpO2 95 %.  Medical Problem List and Plan: 1.  Deficits with mobility, endurance, balance, self-care secondary to  acute left thalamic infarct.  Continue CIR 2.  Antithrombotics: -DVT/anticoagulation:  Pharmaceutical: Lovenox stopped as ambulating >150 feet.              -antiplatelet therapy: DAPT.  3. Pain Management: N/A.  4. Mood: LCSW to follow for evaluation and support.              -antipsychotic agents: N/A 5. Neuropsych: This patient is capable of making decisions on his own behalf. 6. Skin/Wound Care: routine pressure relief measures.  7. Fluids/Electrolytes/Nutrition: Monitor I/Os.    BMP within acceptable range, but?  Trending up on 6/21, except glucose on 6/21, labs ordered for Monday 8. HTN: Monitor BP   off lisinopril at this time.   Relatively controlled on 6/25             Monitor with increased mobility and with medications as necessary. 9.T2DM with hyperglycemia: Hgb A1C- 7.9. Resumed metformin as patient not interested in insulin.   Labile on 6/25, adjust meds if persistent  See #13             Monitor with increased mobility. 10. Dyslipidemia: Continue Lipitor. 11. 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  13. Morbid obesity: Encouraged weight loss. Patient interested in losing weight and thereby decreasing his symptoms of OSA.  Has been educated regarding low carb diet and decreased snacking and provided handouts.  14. OSA: CPAP at night- patient is bringing his from home.  15.  Hypoalbuminemia  Supplement initiated on 6/21 16.  Skin lesions in bilateral volar hands  Continue alternate between Aquaphor and steroid cream  Improving 17.  Cough with laughter, now with some congestion  [place] ordered  LOS: 7 days A FACE TO FACE EVALUATION WAS PERFORMED  [place] Lorie Phenix 04/25/2020, 9:05 AM

## 2020-04-25 NOTE — Progress Notes (Signed)
Physical Therapy Weekly Progress Note  Patient Details  Name: Jordan [name] MRN: [id number] Date of Birth: [birth year]  Beginning of progress report period: April 19, 2020 End of progress report period: April 25, 2020  Today's Date: 04/25/2020 PT Individual Time: 0802-0859 PT Individual Time Calculation (min): 57 min   Patient is progressing well towards all LTGs- due to shorter LOS, which was slightly lengthened mid-stay in order to ensure safe DC environment, STGs have always been the same as LTGs. Progressing well overall and this therapist feels that he will certainly be able to meet all LTGs at this time.   Patient continues to demonstrate the following deficits muscle weakness, decreased cardiorespiratoy endurance, decreased coordination, decreased attention to right and decreased standing balance and decreased balance strategies and therefore will continue to benefit from skilled PT intervention to increase functional independence with mobility.  Patient progressing toward long term goals..  Continue plan of care.  PT Short Term Goals Week 1:  PT Short Term Goal 1 (Week 1): = to LTGs based on ELOS PT Short Term Goal 1 - Progress (Week 1): Progressing toward goal Week 2:  PT Short Term Goal 1 (Week 2): STG = LTGs due to remaining LOS  Skilled Therapeutic Interventions/Progress Updates:    Patient received in bed today, pleasant and willing to participate in session. Able to perform all bed mobility and functional transfers also gait over 273f with RW/distant S; while he does continue to require supervision with occasional cues for safety, hand placement and sequencing and frequency of cues has consistently been decreasing session to session. Focused on balance as this is patient's main concern including forward and backward tandem gait, vertical and horizontal ball rolls, and maintaining tandem stance. Also tolerated riding Nustep for 8 minutes with BLEs/BUEs on resistance 6-7 for  reciprocal training and functional activity tolerance. Left up in recliner with seatbelt alarm active, all needs met. Progressing very well towards all goals.   Therapy Documentation Precautions:  Precautions Precautions: Fall Restrictions Weight Bearing Restrictions: No     Pain: Pain Assessment Pain Scale: 0-10 Pain Score: 0-No pain    Therapy/Group: Individual Therapy   KWindell Norfolk DPT, PN1   Supplemental Physical Therapist CMartin   Pager [phone] Rehab Office [phone]

## 2020-04-25 NOTE — Progress Notes (Signed)
Physical Therapy Discharge Summary  Patient Details  Name: Jordan [name] MRN: [id number] Date of Birth: May 10, 1941  Patient has met 8 of 8 long term goals due to improved activity tolerance, improved balance, improved postural control, increased strength, ability to compensate for deficits and improved coordination. Patient to discharge at an ambulatory level Modified Independent. Patient's care partner is independent to provide cuing but limited in amount of physical assist she can provide due to hip pain/reduced mobility herself . Pt/pt's family verbalized/ demonstrated confidence with all tasks to ensure safe discharge home. Pt does not require any physical assist due to mod I mobility status. Pt/family with no further questions regarding mobility.   Reasons goals not met: N/A all goals met   Recommendation:  Patient will benefit from ongoing skilled PT services in home health setting to continue to advance safe functional mobility, address ongoing impairments in balance, attention to right side, impairments in gait pattern, and minimize fall risk.  Equipment: RW, HHPT   Reasons for discharge: treatment goals met and discharge from hospital  Patient/family agrees with progress made and goals achieved: Yes  PT Discharge Precautions/Restrictions Precautions Precautions: Fall Precaution Comments: R sided inattention  Restrictions Weight Bearing Restrictions: No Vision/Perception  Vision - Assessment Additional Comments: ongoing R inattention Perception Perception: Within Functional Limits Praxis Praxis: Intact  Cognition Overall Cognitive Status: Within Functional Limits for tasks assessed Arousal/Alertness: Awake/alert Orientation Level: Oriented X4 Attention: Selective Focused Attention: Appears intact Sustained Attention: Appears intact Selective Attention: Appears intact Problem Solving: Appears intact Reasoning: Appears intact Safety/Judgment: Other  (comment) Comments: ongoing cues to scan R side/for R inattention, cues for hand placement Sensation Sensation Light Touch: Impaired Detail Peripheral sensation comments: hx of peripheral neuropathy Light Touch Impaired Details: Impaired RLE;Impaired LLE Proprioception: Appears Intact Coordination Gross Motor Movements are Fluid and Coordinated: No Fine Motor Movements are Fluid and Coordinated: No Coordination and Movement Description: impaired finger opposition R>L UE Finger Nose Finger Test: Mildly dysmetric bilaterally Heel Shin Test: mild dysmetria B Motor  Motor Motor: Within Functional Limits Motor - Discharge Observations: habitual flexed posture likely from body habitus and tight hip flexors/weak paraspinals and glutes  Mobility Bed Mobility Bed Mobility: Supine to Sit;Sit to Supine Supine to Sit: Independent Sit to Supine: Independent Transfers Transfers: Sit to Stand;Stand to Sit;Stand Pivot Transfers Sit to Stand: Independent with assistive device Stand to Sit: Independent with assistive device Stand Pivot Transfers: Independent with assistive device Transfer (Assistive device): Rolling walker Locomotion  Gait Ambulation: Yes Gait Assistance: Independent with assistive device Gait Distance (Feet): 250 Feet Assistive device: Rolling walker Gait Assistance Details: Verbal cues for safe use of DME/AE;Other (comment) Gait Assistance Details: cues for posture and to improve visual scanning to R Gait Gait: Yes Gait Pattern: Impaired Gait Pattern: Trunk flexed;Decreased trunk rotation;Trendelenburg Gait velocity: decr Stairs / Additional Locomotion Stairs: Yes Stairs Assistance: Supervision/Verbal cueing Stair Management Technique: Two rails Number of Stairs: 12 Height of Stairs: 6 Ramp: Supervision/Verbal cueing Wheelchair Mobility Wheelchair Mobility: No  Trunk/Postural Assessment  Cervical Assessment Cervical Assessment: Within Functional Limits (forward  head) Thoracic Assessment Thoracic Assessment: Within Functional Limits (kyphotic) Lumbar Assessment Lumbar Assessment: Within Functional Limits (posterior pelvic tilt/flexed at hips) Postural Control Postural Control: Deficits on evaluation Postural Limitations: decreased with pt center of mass anteriorly displaced due to trunk posturing causing anterior lean in standing/ambulating  Balance Balance Balance Assessed: Yes Static Sitting Balance Static Sitting - Balance Support: Feet supported;No upper extremity supported Static Sitting - Level of Assistance: 7: Independent  Dynamic Sitting Balance Dynamic Sitting - Balance Support: Feet supported;No upper extremity supported Dynamic Sitting - Level of Assistance: 7: Independent Static Standing Balance Static Standing - Balance Support: During functional activity Static Standing - Level of Assistance: 5: Stand by assistance Dynamic Standing Balance Dynamic Standing - Balance Support: Bilateral upper extremity supported;During functional activity Dynamic Standing - Level of Assistance: 5: Stand by assistance Extremity Assessment  RUE Assessment RUE Assessment: Not tested LUE Assessment LUE Assessment: Not tested RLE Assessment RLE Assessment: Exceptions to [facility] Active Range of Motion (AROM) Comments: tight hip flexors and hamstrings General Strength Comments: 5/5 grossly LLE Assessment LLE Assessment: Exceptions to [facility] Active Range of Motion (AROM) Comments: tight hip flexors and hamstrings General Strength Comments: grossly 5/5   Aneta Mins PT, DPT  04/25/2020, 12:51 PM

## 2020-04-25 NOTE — Progress Notes (Signed)
Occupational Therapy Session Note  Patient Details  Name: STEPAN VERRETTE MRN: [id number] Date of Birth: 1941-10-09  Today's Date: 04/25/2020 OT Individual Time:first session: 1002-1102 ; second session:  OT Individual Time Calculation (min): first session: 60 min ; second session:    Short Term Goals: Week 1:  OT Short Term Goal 1 (Week 1): STGs=LTGs due to ELOS  Skilled Therapeutic Interventions/Progress Updates:    First session: Pt with no co/ pain, agreeable to OT session.  Pt ambulated from room to ADL suite bathroom using RW with supervision needing only VCs for direction within new environment.  Pt completed tub shower transfer stepping over lip of tub using grab bars with CGA.  Pt reports he does not have grab bars in tub shower at home.  Pt bathed UB and LB with distant supervision sitting primarily on shower chair and standing to bathe buttocks with use of grab bar.  Pt transferred from shower chair to [place] using grab bar and RW and stepping over tub lip with close supervision.  Pt dressed UB with setup and LB with SBA. Padded bandages changed and skin dried on right ankle and left heel.  Pt ambulated back to room with RW with close supervision.  Pt exhibited increased right sided attention during functional mobility today needing less VCs for obstacle maneuvering.  Pt also demonstrated increased independence completing LB dressing.  Call bell in reach, seat alarm on. Second session: Pt with no c/o pain, agreeable to OT session.  Pt ambulated from room to Dayroom using RW with supervision needing only VCs for direction within new environment.  Pt participated in neuro re-ed at keyboard in sitting and standing with VCs needed to increased attention to right hand for improved precision to play various songs of choice and isolated finger drills. Pt exhibited slight improvement with fluidity and precision touching individual keys with right hand isolated and bilaterally integrated with left hand.   Pt instructed through various fine motor control tasks to facilitate in hand manipulation finger<>palm translation, pincer pick up and place, and bilateral hand integration shuffling cards and picking up/placing beads.  Pt also provided surface in room to complete small piece puzzle for [facility] task.  Pt ambulated back to room with supervision and stand to sit in recliner.  Call bell in reach, seat alarm on.  Therapy Documentation Precautions:  Precautions Precautions: Fall Precaution Comments: R sided inattention  Restrictions Weight Bearing Restrictions: No   Therapy/Group: Individual Therapy  Ezekiel Slocumb 04/25/2020, 4:14 PM

## 2020-04-26 ENCOUNTER — Inpatient Hospital Stay (HOSPITAL_COMMUNITY): Payer: Medicare HMO | Admitting: Occupational Therapy

## 2020-04-26 LAB — GLUCOSE, CAPILLARY
Glucose-Capillary: 126 mg/dL — ABNORMAL HIGH (ref 70–99)
Glucose-Capillary: 165 mg/dL — ABNORMAL HIGH (ref 70–99)
Glucose-Capillary: 156 mg/dL — ABNORMAL HIGH (ref 70–99)
Glucose-Capillary: 161 mg/dL — ABNORMAL HIGH (ref 70–99)

## 2020-04-26 NOTE — Progress Notes (Signed)
Placed patient on CPAP set at 9cm for the night

## 2020-04-26 NOTE — Progress Notes (Signed)
[place] PHYSICAL MEDICINE & REHABILITATION PROGRESS NOTE   Subjective/Complaints:   Pt reports sleeping well at night- denies pain- bowel and bladder working well.   Has no complaints.   ROS:  Pt denies SOB, abd pain, CP, N/V/C/D, and vision changes   Objective:   No results found. No results for input(s): WBC, HGB, HCT, PLT in the last 72 hours. No results for input(s): NA, K, CL, CO2, GLUCOSE, BUN, CREATININE, CALCIUM in the last 72 hours.  Intake/Output Summary (Last 24 hours) at 04/26/2020 1101 Last data filed at 04/26/2020 0730 Gross per 24 hour  Intake 577 ml  Output 750 ml  Net -173 ml     Physical Exam: Vital Signs Blood pressure (!) 154/78, pulse 63, temperature 97.7 F (36.5 C), resp. rate 17, height 5\' 8"  (1.727 m), weight 103.7 kg, SpO2 94 %. Constitutional: No distress . Vital signs reviewed.sitting up in bed- watching tv, NAD HENT: Normocephalic.  Atraumatic. Eyes: conjugate gaze- wearing EGs Cardiovascular: no JVD. Respiratory: no resp distress- no cough, no accessory muscle use GI: soft, nondistended Skin: Lesions on bilateral palms Psych: appropriate Musc: No edema in extremities.  No tenderness in extremities. Neurological: Alert Motor: LUE/LLE: 5/5 proximal distal RUE/RLE: 4+-5/5 proximal distal, unchanged  Assessment/Plan: 1. 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  Physiatrist is providing close team supervision and 24 hour management of active medical problems listed below.  Physiatrist and rehab team continue to assess barriers to discharge/monitor patient progress toward functional and medical goals  Care Tool:  Bathing    Body parts bathed by patient: Right arm, Left arm, Chest, Abdomen, Front perineal area, Buttocks, Right upper leg, Left upper leg, Right lower leg, Left lower leg, Face         Bathing assist Assist  Level: Supervision/Verbal cueing     Upper Body Dressing/Undressing Upper body dressing   What is the patient wearing?: Pull over shirt    Upper body assist Assist Level: Set up assist    Lower Body Dressing/Undressing Lower body dressing      What is the patient wearing?: Underwear/pull up, Pants     Lower body assist Assist for lower body dressing: Supervision/Verbal cueing     Toileting Toileting Toileting Activity did not occur (Clothing management and hygiene only): N/A (no void or bm)  Toileting assist Assist for toileting: Supervision/Verbal cueing     Transfers Chair/bed transfer  Transfers assist     Chair/bed transfer assist level: Supervision/Verbal cueing     Locomotion Ambulation   Ambulation assist      Assist level: Supervision/Verbal cueing Assistive device: Walker-rolling Max distance: 368ft   Walk 10 feet activity   Assist     Assist level: Supervision/Verbal cueing Assistive device: Walker-rolling   Walk 50 feet activity   Assist    Assist level: Supervision/Verbal cueing Assistive device: Walker-rolling    Walk 150 feet activity   Assist Walk 150 feet activity did not occur: Safety/medical concerns  Assist level: Supervision/Verbal cueing Assistive device: Walker-rolling    Walk 10 feet on uneven surface  activity   Assist     Assist level: Minimal Assistance - Patient > 75% (ramp) Assistive device: Other (comment) (none)   Wheelchair     Assist Will patient use wheelchair at discharge?: No             Wheelchair 50 feet with 2 turns activity  Assist            Wheelchair 150 feet activity     Assist          Blood pressure (!) 154/78, pulse 63, temperature 97.7 F (36.5 C), resp. rate 17, height 5\' 8"  (1.727 m), weight 103.7 kg, SpO2 94 %.  Medical Problem List and Plan: 1.  Deficits with mobility, endurance, balance, self-care secondary to acute left thalamic  infarct.  Continue CIR 2.  Antithrombotics: -DVT/anticoagulation:  Pharmaceutical: Lovenox stopped as ambulating >150 feet.              -antiplatelet therapy: DAPT.  3. Pain Management: N/A.  4. Mood: LCSW to follow for evaluation and support.              -antipsychotic agents: N/A 5. Neuropsych: This patient is capable of making decisions on his own behalf. 6. Skin/Wound Care: routine pressure relief measures.  7. Fluids/Electrolytes/Nutrition: Monitor I/Os.    BMP within acceptable range, but?  Trending up on 6/21, except glucose on 6/21, labs ordered for Monday 8. HTN: Monitor BP   off lisinopril at this time.   Relatively controlled on 6/25             Monitor with increased mobility and with medications as necessary. 9.T2DM with hyperglycemia: Hgb A1C- 7.9. Resumed metformin as patient not interested in insulin.   Labile on 6/25, adjust meds if persistent  6/26- BGs 112- 1 reading of 193- but otherwise good- con't regimen  See #13             Monitor with increased mobility. 10. Dyslipidemia: Continue Lipitor. 11. 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  13. Morbid obesity: Encouraged weight loss. Patient interested in losing weight and thereby decreasing his symptoms of OSA.  Has been educated regarding low carb diet and decreased snacking and provided handouts.  14. OSA: CPAP at night- patient is bringing his from home.  15.  Hypoalbuminemia  Supplement initiated on 6/21 16.  Skin lesions in bilateral volar hands  Continue alternate between Aquaphor and steroid cream  Improving 17.  Cough with laughter, now with some congestion  Accapella ordered  6/26- no coughing today overheard  LOS: 8 days A FACE TO FACE EVALUATION WAS PERFORMED  Chong January 04/26/2020, 11:01 AM

## 2020-04-26 NOTE — Progress Notes (Signed)
Occupational Therapy Session Note  Patient Details  Name: Jordan [name] MRN: [id number] Date of Birth: 1941/09/11  Today's Date: 04/26/2020 OT Individual Time: 1430-1515 OT Individual Time Calculation (min): 45 min    Short Term Goals: Week 1:  OT Short Term Goal 1 (Week 1): STGs=LTGs due to ELOS      Skilled Therapeutic Interventions/Progress Updates:    Pt received in recliner requesting to change clothing. Pt first ambulated to bathroom to toilet, then returned to recliner to change clothing.  Pt completed all tasks with S with a cue to sit down to doff/don clothing over feet.  He donned regular socks and tie shoes without difficulty.  Ambulated to tub room to practice stepping over tub wall. Worked on side stepping into tub with grab bar.  Pt practiced 4x all with close S.  Discussed installing grab bars or using a suction bar.  Pt requested time to work with keyboard piano to practice his [facility]. Obtained board and brought to his room to allow him to work on it for an hour on his own time.   Pt resting in recliner with alarm on and all needs met.      Therapy Documentation Precautions:  Precautions Precautions: Fall Precaution Comments: R sided inattention  Restrictions Weight Bearing Restrictions: No    Vital Signs: Therapy Vitals Temp: 97.7 F (36.5 C) Pulse Rate: 63 Resp: 17 BP: (!) 154/78 Patient Position (if appropriate): Lying Oxygen Therapy SpO2: 94 % O2 Device: CPAP Pain: Pain Assessment Pain Score: Asleep ADL: ADL Eating: Not assessed Grooming: Supervision/safety Where Assessed-Grooming: Standing at sink Upper Body Bathing: Setup Where Assessed-Upper Body Bathing: Shower Lower Body Bathing: Supervision/safety Where Assessed-Lower Body Bathing: Shower Upper Body Dressing: Setup Where Assessed-Upper Body Dressing: Edge of bed Lower Body Dressing: Contact guard Where Assessed-Lower Body Dressing: Edge of bed Toileting: Not assessed Toilet  Transfer: Therapist, music Method: Ambulating (without AD) Science writer: Emergency planning/management officer Transfer: Curator Method: Ambulating (without AD) Youth worker: Transfer tub bench   Therapy/Group: Individual Therapy  [place] 04/26/2020, 8:28 AM

## 2020-04-26 NOTE — Plan of Care (Signed)
  Problem: RH SKIN INTEGRITY Goal: RH STG SKIN FREE OF INFECTION/BREAKDOWN Description: Remain free of breakdown and infection withmin assist Outcome: Progressing   Problem: RH SAFETY Goal: RH STG ADHERE TO SAFETY PRECAUTIONS W/ASSISTANCE/DEVICE Description: STG Adhere to Safety Precautions With Assistance/Device. Mod I Outcome: Progressing   Problem: Consults Goal: RH STROKE PATIENT EDUCATION Description: See Patient Education module for education specifics  Outcome: Progressing   Problem: RH KNOWLEDGE DEFICIT Goal: RH STG INCREASE KNOWLEDGE OF DIABETES Description: Patient will be able to manage DM and secondary stroke risks independently using handouts and educational resources Outcome: Progressing Goal: RH STG INCREASE KNOWLEDGE OF HYPERTENSION Description: Patient will be able to manage HTN; medications and dietary restrictions and secondary stroke risks independently using handouts and educational resources Outcome: Progressing Goal: RH STG INCREASE KNOWLEGDE OF HYPERLIPIDEMIA Description: Patient will be able to manage HLD; medications and dietary restrictions and secondary stroke risks independently using handouts and educational resources Outcome: Progressing Goal: RH STG INCREASE KNOWLEDGE OF STROKE PROPHYLAXIS Description: Patient will be able to manage risk factors and secondary stroke risks independently using handouts and educational resources; including DAPT x 3 weeks then ASA solo. Outcome: Progressing

## 2020-04-27 ENCOUNTER — Inpatient Hospital Stay (HOSPITAL_COMMUNITY): Payer: Medicare HMO

## 2020-04-27 LAB — GLUCOSE, CAPILLARY
Glucose-Capillary: 117 mg/dL — ABNORMAL HIGH (ref 70–99)
Glucose-Capillary: 138 mg/dL — ABNORMAL HIGH (ref 70–99)
Glucose-Capillary: 118 mg/dL — ABNORMAL HIGH (ref 70–99)
Glucose-Capillary: 154 mg/dL — ABNORMAL HIGH (ref 70–99)

## 2020-04-27 NOTE — Progress Notes (Signed)
Occupational Therapy Session Note  Patient Details  Name: Jordan [name] MRN: [id number] Date of Birth: [birth year]  Today's Date: 04/27/2020 OT Individual Time: 1320-1400 OT Individual Time Calculation (min): 40 min    Short Term Goals: Week 1:  OT Short Term Goal 1 (Week 1): STGs=LTGs due to ELOS  Skilled Therapeutic Interventions/Progress Updates:    Pt received sitting in recliner practicing piano on keyboard. Pt requesting to take shower and with no c/o pain. Pt completed sit > stand with CGA. Pt used RW to complete ambulatory transfer into the bathroom and onto TTB. Min cueing required for sequencing steps of undressing with safety awareness in mind. Pt bathed UB seated with (S), dropping washcloth and soap x2 with his R UE. Edu on impact of visual attention to neglect. Pt completed LB bathing sit <> stand with CGA, occasional mild LOB to the R. Pt transferred back to the recliner where he donned a shirt with (S). (S) to don pants. Pt was left sitting up with all needs met, chair alarm set.   Therapy Documentation Precautions:  Precautions Precautions: Fall Precaution Comments: R sided inattention  Restrictions Weight Bearing Restrictions: No  Therapy/Group: Individual Therapy  Curtis Sites 04/27/2020, 6:50 AM

## 2020-04-27 NOTE — Progress Notes (Signed)
[place] PHYSICAL MEDICINE & REHABILITATION PROGRESS NOTE   Subjective/Complaints:   Pt reports doing well- has this bump on the back of L foot- has never hurt in 60 years, however keeps growing- getting wound care to it this AM by nursing- LBM this AM- no pain- wants musical keyboard to work on playing piano since had stroke- NT will get for pt.   ROS:   Pt denies SOB, abd pain, CP, N/V/C/D, and vision changes  Objective:   No results found. No results for input(s): WBC, HGB, HCT, PLT in the last 72 hours. No results for input(s): NA, K, CL, CO2, GLUCOSE, BUN, CREATININE, CALCIUM in the last 72 hours.  Intake/Output Summary (Last 24 hours) at 04/27/2020 1101 Last data filed at 04/27/2020 0806 Gross per 24 hour  Intake 684 ml  Output 200 ml  Net 484 ml     Physical Exam: Vital Signs Blood pressure (!) 146/80, pulse 67, temperature 97.9 F (36.6 C), temperature source Oral, resp. rate 18, height 5\' 8"  (1.727 m), weight 103.7 kg, SpO2 92 %. Constitutional: No distress .sitting up in bed- wants to play music, RN doing wound care on feet, NAD HENT: Normocephalic.  Atraumatic. Eyes: conjugate gaze- wearing EGs Cardiovascular:RRR Respiratory: CTA B/L- no W/R/R- good air movement GI: Soft, NT, ND, (+)BS  Skin: Lesions on bilateral palms and ankles- appears to be psoriasis- also has large bump- 1/2 grape size on back of L heel- skin denuded on it-  Psych: appropriate Musc: No edema in extremities.  No tenderness in extremities. Neurological: Alert Motor: LUE/LLE: 5/5 proximal distal RUE/RLE: 4+-5/5 proximal distal, unchanged  Assessment/Plan: 1. 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  Physiatrist is providing close team supervision and 24 hour management of active medical problems listed below.  Physiatrist and rehab team continue to assess barriers to  discharge/monitor patient progress toward functional and medical goals  Care Tool:  Bathing    Body parts bathed by patient: Right arm, Left arm, Chest, Abdomen, Front perineal area, Buttocks, Right upper leg, Left upper leg, Right lower leg, Left lower leg, Face         Bathing assist Assist Level: Supervision/Verbal cueing     Upper Body Dressing/Undressing Upper body dressing   What is the patient wearing?: Pull over shirt    Upper body assist Assist Level: Set up assist    Lower Body Dressing/Undressing Lower body dressing      What is the patient wearing?: Underwear/pull up, Pants     Lower body assist Assist for lower body dressing: Supervision/Verbal cueing     Toileting Toileting Toileting Activity did not occur (Clothing management and hygiene only): N/A (no void or bm)  Toileting assist Assist for toileting: Supervision/Verbal cueing     Transfers Chair/bed transfer  Transfers assist     Chair/bed transfer assist level: Supervision/Verbal cueing     Locomotion Ambulation   Ambulation assist      Assist level: Supervision/Verbal cueing Assistive device: Walker-rolling Max distance: 315ft   Walk 10 feet activity   Assist     Assist level: Supervision/Verbal cueing Assistive device: Walker-rolling   Walk 50 feet activity   Assist    Assist level: Supervision/Verbal cueing Assistive device: Walker-rolling    Walk 150 feet activity   Assist Walk 150 feet activity did not occur: Safety/medical concerns  Assist level: Supervision/Verbal cueing Assistive device: Walker-rolling    Walk 10  feet on uneven surface  activity   Assist     Assist level: Minimal Assistance - Patient > 75% (ramp) Assistive device: Other (comment) (none)   Wheelchair     Assist Will patient use wheelchair at discharge?: No             Wheelchair 50 feet with 2 turns activity    Assist            Wheelchair 150 feet activity      Assist          Blood pressure (!) 146/80, pulse 67, temperature 97.9 F (36.6 C), temperature source Oral, resp. rate 18, height 5\' 8"  (1.727 m), weight 103.7 kg, SpO2 92 %.  Medical Problem List and Plan: 1.  Deficits with mobility, endurance, balance, self-care secondary to acute left thalamic infarct.  Continue CIR  6/27- getting music keyboard for pt to work on fine motor control 2.  Antithrombotics: -DVT/anticoagulation:  Pharmaceutical: Lovenox stopped as ambulating >150 feet.              -antiplatelet therapy: DAPT.  3. Pain Management: N/A.  4. Mood: LCSW to follow for evaluation and support.              -antipsychotic agents: N/A 5. Neuropsych: This patient is capable of making decisions on his own behalf. 6. Skin/Wound Care: routine pressure relief measures.  7. Fluids/Electrolytes/Nutrition: Monitor I/Os.    BMP within acceptable range, but?  Trending up on 6/21, except glucose on 6/21, labs ordered for Monday 8. HTN: Monitor BP   off lisinopril at this time.   Relatively controlled on 6/25  6/27- BP slightly high at 146/80- was OK yesterday, so will wait to make changes ot meds             Monitor with increased mobility and with medications as necessary. 9.T2DM with hyperglycemia: Hgb A1C- 7.9. Resumed metformin as patient not interested in insulin.   Labile on 6/25, adjust meds if persistent  6/26- BGs 112- 1 reading of 193- but otherwise good- con't regimen  6/27- 117-165- doing better- con't regimen  See #13             Monitor with increased mobility. 10. Dyslipidemia: Continue Lipitor. 11. 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  13. Morbid obesity: Encouraged weight loss. Patient interested in losing weight and thereby decreasing his symptoms of OSA.  Has been educated regarding low carb diet and decreased snacking and provided handouts.  14. OSA: CPAP at night- patient is bringing his from home.  15.   Hypoalbuminemia  Supplement initiated on 6/21 16.  Skin lesions in bilateral volar hands  Continue alternate between Aquaphor and steroid cream  6/27- also on B/ L ankles- improving per pt; psoriasis?  Improving 17.  Cough with laughter, now with some congestion  Accapella ordered  6/26- no coughing today overheard  LOS: 9 days A FACE TO FACE EVALUATION WAS PERFORMED  Ndidi Nesby 04/27/2020, 11:01 AM

## 2020-04-27 NOTE — Plan of Care (Signed)
  Problem: RH SKIN INTEGRITY Goal: RH STG SKIN FREE OF INFECTION/BREAKDOWN Description: Remain free of breakdown and infection withmin assist Outcome: Progressing   Problem: RH SAFETY Goal: RH STG ADHERE TO SAFETY PRECAUTIONS W/ASSISTANCE/DEVICE Description: STG Adhere to Safety Precautions With Assistance/Device. Mod I Outcome: Progressing   Problem: Consults Goal: RH STROKE PATIENT EDUCATION Description: See Patient Education module for education specifics  Outcome: Progressing   Problem: RH KNOWLEDGE DEFICIT Goal: RH STG INCREASE KNOWLEDGE OF DIABETES Description: Patient will be able to manage DM and secondary stroke risks independently using handouts and educational resources Outcome: Progressing Goal: RH STG INCREASE KNOWLEDGE OF HYPERTENSION Description: Patient will be able to manage HTN; medications and dietary restrictions and secondary stroke risks independently using handouts and educational resources Outcome: Progressing Goal: RH STG INCREASE KNOWLEGDE OF HYPERLIPIDEMIA Description: Patient will be able to manage HLD; medications and dietary restrictions and secondary stroke risks independently using handouts and educational resources Outcome: Progressing Goal: RH STG INCREASE KNOWLEDGE OF STROKE PROPHYLAXIS Description: Patient will be able to manage risk factors and secondary stroke risks independently using handouts and educational resources; including DAPT x 3 weeks then ASA solo. Outcome: Progressing

## 2020-04-28 ENCOUNTER — Inpatient Hospital Stay (HOSPITAL_COMMUNITY): Payer: Medicare HMO | Admitting: Occupational Therapy

## 2020-04-28 ENCOUNTER — Inpatient Hospital Stay (HOSPITAL_COMMUNITY): Payer: Medicare HMO

## 2020-04-28 DIAGNOSIS — N179 Acute kidney failure, unspecified: Secondary | ICD-10-CM

## 2020-04-28 LAB — CBC
HCT: 41.9 % (ref 39.0–52.0)
Hemoglobin: 13.7 g/dL (ref 13.0–17.0)
MCH: 29 pg (ref 26.0–34.0)
MCHC: 32.7 g/dL (ref 30.0–36.0)
MCV: 88.8 fL (ref 80.0–100.0)
Platelets: 238 10*3/uL (ref 150–400)
RBC: 4.72 MIL/uL (ref 4.22–5.81)
RDW: 13.2 % (ref 11.5–15.5)
WBC: 9.1 10*3/uL (ref 4.0–10.5)
nRBC: 0 % (ref 0.0–0.2)

## 2020-04-28 LAB — BASIC METABOLIC PANEL
Anion gap: 10 (ref 5–15)
BUN: 21 mg/dL (ref 8–23)
CO2: 22 mmol/L (ref 22–32)
Calcium: 9.2 mg/dL (ref 8.9–10.3)
Chloride: 107 mmol/L (ref 98–111)
Creatinine, Ser: 1.13 mg/dL (ref 0.61–1.24)
GFR calc Af Amer: >60 mL/min (ref >60)
GFR calc non Af Amer: >60 mL/min (ref >60)
Glucose, Bld: 135 mg/dL — ABNORMAL HIGH (ref 70–99)
Potassium: 4.1 mmol/L (ref 3.5–5.1)
Sodium: 139 mmol/L (ref 135–145)

## 2020-04-28 LAB — GLUCOSE, CAPILLARY
Glucose-Capillary: 125 mg/dL — ABNORMAL HIGH (ref 70–99)
Glucose-Capillary: 119 mg/dL — ABNORMAL HIGH (ref 70–99)
Glucose-Capillary: 138 mg/dL — ABNORMAL HIGH (ref 70–99)
Glucose-Capillary: 141 mg/dL — ABNORMAL HIGH (ref 70–99)

## 2020-04-28 NOTE — Progress Notes (Signed)
Occupational Therapy Discharge Summary  Patient Details  Name: Jordan [name] MRN: [id number] Date of Birth: Jun 01, 1941   Patient has met 7 of 8 long term goals due to improved activity tolerance, improved balance, postural control, ability to compensate for deficits, functional use of  RIGHT upper and RIGHT lower extremity, improved attention, improved awareness and improved coordination.  Patient to discharge at overall Modified Independent level with basic self care and S with tub transfers and IADLs.   Patient's care partner is independent to provide the necessary physical assistance at discharge.    Reasons goals not met: Pt needs S with tub transfers vs. Set up.  Recommendation:  Patient will benefit from ongoing skilled OT services in home health setting to continue to advance functional skills in the area of iADL.  Equipment: No equipment provided  Reasons for discharge: treatment goals met  Patient/family agrees with progress made and goals achieved: Yes  OT Discharge ADL ADL Eating: Independent Grooming: Independent Where Assessed-Grooming: Standing at sink Upper Body Bathing: Setup Where Assessed-Upper Body Bathing: Shower Lower Body Bathing: Setup Where Assessed-Lower Body Bathing: Shower Upper Body Dressing: Independent Where Assessed-Upper Body Dressing: Chair Lower Body Dressing: Independent, Modified independent Where Assessed-Lower Body Dressing: Chair Toileting: Modified independent Where Assessed-Toileting: Glass blower/designer: Diplomatic Services operational officer Method: Ambulating (without AD) Science writer: Energy manager: Close supervison Clinical cytogeneticist Method: Optometrist: Civil engineer, contracting with back, Energy manager: Close supervision Social research officer, government Method: Heritage manager: Radio broadcast assistant Vision Baseline Vision/History: Wears glasses Wears  Glasses: At all times Patient Visual Report: No change from baseline Vision Assessment?: No apparent visual deficits Additional Comments: peripheral fields and visual tracking intact Perception  Comments: slight R inattention, also has decreased sensation (numbness) in R hand Praxis Praxis: Intact Cognition Overall Cognitive Status: Within Functional Limits for tasks assessed Sensation Sensation Peripheral sensation comments: hx of peripheral neuropathy Light Touch Impaired Details: Impaired RLE;Impaired LLE Hot/Cold: Appears Intact Proprioception: Appears Intact Stereognosis: Impaired by gross assessment Additional Comments: decreased sensation in R hand Coordination Gross Motor Movements are Fluid and Coordinated: No Fine Motor Movements are Fluid and Coordinated: No Coordination and Movement Description: impaired finger opposition R>L UE Finger Nose Finger Test: Mildly dysmetric bilaterally Motor  Motor Motor: Within Functional Limits Mobility  Transfers Sit to Stand: Independent with assistive device Stand to Sit: Independent with assistive device  Trunk/Postural Assessment  Postural Control Postural Limitations: decreased with pt center of mass anteriorly displaced due to trunk posturing causing anterior lean in standing/ambulating  Balance Static Sitting Balance Static Sitting - Level of Assistance: 7: Independent Dynamic Sitting Balance Dynamic Sitting - Level of Assistance: 7: Independent Static Standing Balance Static Standing - Level of Assistance: 6: Modified independent (Device/Increase time) Dynamic Standing Balance Dynamic Standing - Level of Assistance: 6: Modified independent (Device/Increase time) (uses RW for support during LB self care) Extremity/Trunk Assessment RUE Assessment RUE Assessment: Within Functional Limits LUE Assessment LUE Assessment: Within Functional Limits   SAGUIER,JULIA 04/28/2020, 1:00 PM

## 2020-04-28 NOTE — Progress Notes (Signed)
Occupational Therapy Session Note  Patient Details  Name: Jordan [name] MRN: [id number] Date of Birth: 1941/09/03  Today's Date: 04/28/2020 OT Individual Time: 1300-1400 OT Individual Time Calculation (min): 60 min    Short Term Goals: Week 1:  OT Short Term Goal 1 (Week 1): STGs=LTGs due to ELOS Week 2:     Skilled Therapeutic Interventions/Progress Updates:    Patient seated in recliner.  He denies pain and is agreeable to participate in therapy session.  Sit to stand and SPT to/from recliner, chair with and without arms, ambulation with RW to therapy treatment spaces at CS level. Completed;  dynavision - visual scanning and reaction time activity - 2 minute, whole screen in stance - trial 1 with left UE = 2.79 sec, trial 2 with right UE = 2.14 sec (no significant difference between R and L aspect)  Box and blocks:  L = 44, R = 43 9 hole peg:  L = 33 sec, R = 25.5 sec Reviewed and practiced hand dexterity activities that he can practice in home setting.  Reinforced need to wait until clearance by MD before return to driving.   He returned to recliner at close of session - seat alarm set and call bell in hand.    Therapy Documentation Precautions:  Precautions Precautions: Fall Precaution Comments: R sided inattention  Restrictions Weight Bearing Restrictions: No   Therapy/Group: Individual Therapy  Carlos Levering 04/28/2020, 7:53 AM

## 2020-04-28 NOTE — Progress Notes (Signed)
Occupational Therapy Session Note  Patient Details  Name: [name] MRN: [id number] Date of Birth: 01-19-1941  Today's Date: 04/28/2020 OT Individual Time: 1503-1550 OT Individual Time Calculation (min): 47 min    Short Term Goals: Week 1:  OT Short Term Goal 1 (Week 1): STGs=LTGs due to ELOS  Skilled Therapeutic Interventions/Progress Updates:    Pt sitting up in recliner playing keyboard piano upon OT arrival.  Pt with no c/o pain.  Discussed discharge recommendations with pt, and he reports no further questions regarding OT.  Pt ambulated from room to dayroom with RW needing supervision with no LOB and good obstacle negotiation noted.  Pt performed endurance and strength training BUE/BLE on recumbent bicycle at level 6 70-80 RPM x 10 minutes. Pt ambulated using RW back to room and stand to sit at recliner.  Call bell and seat alarm on.    Therapy Documentation Precautions:  Precautions Precautions: Fall Precaution Comments: R sided inattention  Restrictions Weight Bearing Restrictions: No   Therapy/Group: Individual Therapy  Ezekiel Slocumb 04/28/2020, 5:11 PM

## 2020-04-28 NOTE — Progress Notes (Signed)
Physical Therapy Session Note  Patient Details  Name: Jordan [name] MRN: [id number] Date of Birth: [birth year]  Today's Date: 04/28/2020 PT Individual Time: 1100-1155 PT Individual Time Calculation (min): 55 min   Short Term Goals: Week 1:  PT Short Term Goal 1 (Week 1): = to LTGs based on ELOS PT Short Term Goal 1 - Progress (Week 1): Progressing toward goal Week 2:  PT Short Term Goal 1 (Week 2): STG = LTGs due to remaining LOS  Skilled Therapeutic Interventions/Progress Updates:   Received pt sitting in recliner, pt agreeable to therapy, and denied any pain during session. Family present for family education training. Session with emphasis on discharge planning, functional mobility/transfers, generalized strengthening, dynamic standing balance/coordinaiton, ambulation, stair navigation, simulated car transfers, and improved activity tolerance. Pt ambulated 139ft mod I with RW to ortho gym and performed ambulatory car transfer mod I with RW. Pt ambulated 4ft on uneven surfaces (ramp) with RW and supervision and required cues to decrease cadence for safety. Pt transferred sit<>stand mod I with RW and picked up small peg from floor; no LOB noted. Pt ambulated 156ft mod I with RW to therapy gym and navigated 12 steps with 2 rails ascending and descending with a step to pattern with supervision and cues to decrease cadence for safety and to ensure proper foot placement on step. Pt's family educated on providing cues/reminders for RW safety. Discussed therapy after discharge and pt reported wanting to return to his therapist at the [facility]. Pt's family with questions regarding handicap parking pass; CSW notified. Pt ambulated 29ft mod I with RW to rehab apartment and performed furniture transfer onto recliner with RW mod I. Pt transferred stand<>pivot recliner<>bed and performed all bed mobility independently. Pt ambulated 165ft mod I with RW back to room. Concluded session with pt sitting in recliner,  needs within reach, and chair pad alarm on.    Therapy Documentation Precautions:  Precautions Precautions: Fall Precaution Comments: R sided inattention  Restrictions Weight Bearing Restrictions: No   Therapy/Group: Individual Therapy Alfonse Alpers PT, DPT   04/28/2020, 7:39 AM

## 2020-04-28 NOTE — Progress Notes (Signed)
[place] PHYSICAL MEDICINE & REHABILITATION PROGRESS NOTE   Subjective/Complaints: Patient seen laying in bed this AM.  He states he slept well overnight.  He states he had a good weekend. He has questions regarding family education.   ROS:  Denies CP, SOB, N/V/D  Objective:   No results found. Recent Labs    04/28/20 0538  WBC 9.1  HGB 13.7  HCT 41.9  PLT 238   Recent Labs    04/28/20 0538  NA 139  K 4.1  CL 107  CO2 22  GLUCOSE 135*  BUN 21  CREATININE 1.13  CALCIUM 9.2    Intake/Output Summary (Last 24 hours) at 04/28/2020 1119 Last data filed at 04/28/2020 0800 Gross per 24 hour  Intake 804 ml  Output 425 ml  Net 379 ml     Physical Exam: Vital Signs Blood pressure 134/81, pulse 74, temperature 98.6 F (37 C), temperature source Oral, resp. rate 16, height 5\' 8"  (1.727 m), weight 103.7 kg, SpO2 93 %. Constitutional: No distress . Vital signs reviewed. HENT: Normocephalic.  Atraumatic. Eyes: EOMI. No discharge. Cardiovascular: No JVD. Respiratory: Normal effort.  No stridor. GI: Non-distended. Skin: Lesions to bilateral palms healing Warm and dry.  Intact. Psych: Normal mood.  Normal behavior. Musc: No edema in extremities.  No tenderness in extremities. Neurological: Alert Motor: LUE/LLE: 5/5 proximal distal RUE/RLE: 4+-5/5 proximal distal, stable  Assessment/Plan: 1. 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  Physiatrist is providing close team supervision and 24 hour management of active medical problems listed below.  Physiatrist and rehab team continue to assess barriers to discharge/monitor patient progress toward functional and medical goals  Care Tool:  Bathing    Body parts bathed by patient: Right arm, Left arm, Chest, Abdomen, Front perineal area, Buttocks, Right upper leg, Left upper leg, Right lower leg, Left lower leg,  Face         Bathing assist Assist Level: Supervision/Verbal cueing     Upper Body Dressing/Undressing Upper body dressing   What is the patient wearing?: Pull over shirt    Upper body assist Assist Level: Set up assist    Lower Body Dressing/Undressing Lower body dressing      What is the patient wearing?: Underwear/pull up, Pants     Lower body assist Assist for lower body dressing: Supervision/Verbal cueing     Toileting Toileting Toileting Activity did not occur (Clothing management and hygiene only): N/A (no void or bm)  Toileting assist Assist for toileting: Supervision/Verbal cueing     Transfers Chair/bed transfer  Transfers assist     Chair/bed transfer assist level: Supervision/Verbal cueing     Locomotion Ambulation   Ambulation assist      Assist level: Supervision/Verbal cueing Assistive device: Walker-rolling Max distance: 369ft   Walk 10 feet activity   Assist     Assist level: Supervision/Verbal cueing Assistive device: Walker-rolling   Walk 50 feet activity   Assist    Assist level: Supervision/Verbal cueing Assistive device: Walker-rolling    Walk 150 feet activity   Assist Walk 150 feet activity did not occur: Safety/medical concerns  Assist level: Supervision/Verbal cueing Assistive device: Walker-rolling    Walk 10 feet on uneven surface  activity   Assist     Assist level: Minimal Assistance - Patient > 75% (ramp) Assistive device: Other (comment) (none)   Wheelchair     Assist Will patient use wheelchair  at discharge?: No             Wheelchair 50 feet with 2 turns activity    Assist            Wheelchair 150 feet activity     Assist          Blood pressure 134/81, pulse 74, temperature 98.6 F (37 C), temperature source Oral, resp. rate 16, height 5\' 8"  (1.727 m), weight 103.7 kg, SpO2 93 %.  Medical Problem List and Plan: 1.  Deficits with mobility, endurance,  balance, self-care secondary to acute left thalamic infarct.  Continue CIR 2.  Antithrombotics: -DVT/anticoagulation:  Pharmaceutical: Lovenox stopped as ambulating >150 feet.              -antiplatelet therapy: DAPT.  3. Pain Management: N/A.  4. Mood: LCSW to follow for evaluation and support.              -antipsychotic agents: N/A 5. Neuropsych: This patient is capable of making decisions on his own behalf. 6. Skin/Wound Care: routine pressure relief measures.  7. Fluids/Electrolytes/Nutrition: Monitor I/Os.   8. HTN: Monitor BP   off lisinopril at this time.   Controlled on 6/28             Monitor with increased mobility and with medications as necessary. 9.T2DM with hyperglycemia: Hgb A1C- 7.9. Resumed metformin as patient not interested in insulin.   Slightly elevated on 6/28  See #13             Monitor with increased mobility. 10. Dyslipidemia: Continue Lipitor. 11. 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  13. Morbid obesity: Encouraged weight loss. Patient interested in losing weight and thereby decreasing his symptoms of OSA.  Has been educated regarding low carb diet and decreased snacking and provided handouts.  14. OSA: CPAP at night- patient is bringing his from home.  15.  Hypoalbuminemia  Supplement initiated on 6/21 16.  Skin lesions in bilateral volar hands  Continue alternate between Aquaphor and steroid cream  Improving 17.  Cough with laughter, now with some congestion  Accapella ordered  Improving 18. AKI  Cr. 1.13 on 6/28  Encourage fluids  LOS: 10 days A FACE TO FACE EVALUATION WAS PERFORMED  Geoge Lawrance Lorie Phenix 04/28/2020, 11:19 AM

## 2020-04-28 NOTE — Progress Notes (Signed)
Occupational Therapy Session Note  Patient Details  Name: Jordan [name] MRN: [id number] Date of Birth: September 17, 1941  Today's Date: 04/28/2020 OT Individual Time: 0935-1040 OT Individual Time Calculation (min): 65 min    Short Term Goals: Week 1:  OT Short Term Goal 1 (Week 1): STGs=LTGs due to ELOS  Skilled Therapeutic Interventions/Progress Updates:    Pt seen for family education with his spouse and ADL training.  Pt demonstrated to his spouse how he completes toileting, shower, dressing.  At home pt will be using a step in tub. In tub room, pt demonstrated how he steps over tub wall with S using grab bar.  Showed spouse how to use a suction cup grab bar and where to buy one. If it does not work safely she may need to have a bar installed.   In ADL apt, pt practiced sit to stands 3x from low leather couch with cues the first time and then he repeat demonstrated the ability to do so without cues.   Overall, pt is now mod I except his wife should provide some S getting in and out of tub.  Discussed decreased  R hand sensation, decreased attention to R side and how he will need to have those areas evaluated again before he returns to driving even with MD approval.    Therapy Documentation Precautions:  Precautions Precautions: Fall Precaution Comments: R sided inattention  Restrictions Weight Bearing Restrictions: No:   Pain: no c/o Pain   ADL: ADL Eating: Independent Grooming: Independent Where Assessed-Grooming: Standing at sink Upper Body Bathing: Setup Where Assessed-Upper Body Bathing: Shower Lower Body Bathing: Setup Where Assessed-Lower Body Bathing: Shower Upper Body Dressing: Independent Where Assessed-Upper Body Dressing: Chair Lower Body Dressing: Independent, Modified independent Where Assessed-Lower Body Dressing: Chair Toileting: Modified independent Where Assessed-Toileting: Glass blower/designer: Diplomatic Services operational officer Method: Ambulating  (without AD) Science writer: Energy manager: Close supervison Clinical cytogeneticist Method: Optometrist: Civil engineer, contracting with back, Energy manager: Close supervision Social research officer, government Method: Heritage manager: Radio broadcast assistant   Therapy/Group: Individual Therapy  Marguriete Wootan 04/28/2020, 12:53 PM

## 2020-04-28 NOTE — Plan of Care (Signed)
  Problem: RH SAFETY Goal: RH STG ADHERE TO SAFETY PRECAUTIONS W/ASSISTANCE/DEVICE Description: STG Adhere to Safety Precautions With Assistance/Device. Mod I Outcome: Progressing   Problem: RH KNOWLEDGE DEFICIT Goal: RH STG INCREASE KNOWLEDGE OF DIABETES Description: Patient will be able to manage DM and secondary stroke risks independently using handouts and educational resources Outcome: Progressing Goal: RH STG INCREASE KNOWLEDGE OF HYPERTENSION Description: Patient will be able to manage HTN; medications and dietary restrictions and secondary stroke risks independently using handouts and educational resources Outcome: Progressing Goal: RH STG INCREASE KNOWLEGDE OF HYPERLIPIDEMIA Description: Patient will be able to manage HLD; medications and dietary restrictions and secondary stroke risks independently using handouts and educational resources Outcome: Progressing Goal: RH STG INCREASE KNOWLEDGE OF STROKE PROPHYLAXIS Description: Patient will be able to manage risk factors and secondary stroke risks independently using handouts and educational resources; including DAPT x 3 weeks then ASA solo. Outcome: Progressing

## 2020-04-29 LAB — GLUCOSE, CAPILLARY
Glucose-Capillary: 108 mg/dL — ABNORMAL HIGH (ref 70–99)
Glucose-Capillary: 156 mg/dL — ABNORMAL HIGH (ref 70–99)

## 2020-04-29 MED ORDER — ASPIRIN 81 MG PO TBEC: 81.0000 mg | DELAYED_RELEASE_TABLET | Freq: Every day | ORAL | 0 refills | Status: AC

## 2020-04-29 MED ORDER — ATORVASTATIN CALCIUM 40 MG PO TABS: 20.0000 mg | ORAL_TABLET | Freq: Every day | ORAL | 0 refills | Status: DC

## 2020-04-29 MED ORDER — METFORMIN HCL ER 500 MG PO TB24: 2000.0000 mg | ORAL_TABLET | Freq: Every day | ORAL | 0 refills | Status: DC

## 2020-04-29 MED ORDER — CLOPIDOGREL BISULFATE 75 MG PO TABS: 75.0000 mg | ORAL_TABLET | Freq: Every day | ORAL | 1 refills | Status: DC

## 2020-04-29 MED ORDER — TERBINAFINE HCL 1 % EX CREA: TOPICAL_CREAM | Freq: Two times a day (BID) | CUTANEOUS | 1 refills | Status: DC

## 2020-04-29 NOTE — Progress Notes (Signed)
Inpatient Rehabilitation Care Coordinator  Discharge Note  The overall goal for the admission was met for:   Discharge location: Yes, home  Length of Stay: Yes, 11 Days  Discharge activity level: Yes, Supervision  Home/community participation: Yes  Services provided included: MD, RD, PT, OT, SLP, RN, CM, TR, Pharmacy, [place]: Private Insurance: [place] [employer]  Follow-up services arranged: Home Health: [facility]  Comments (or additional information): PT OT ST  Patient/Family verbalized understanding of follow-up arrangements: Yes  Individual responsible for coordination of the follow-up plan: [name] [phone]  Confirmed correct DME delivered: Dyanne Iha 04/29/2020    Dyanne Iha

## 2020-04-29 NOTE — Plan of Care (Signed)
  Problem: RH SKIN INTEGRITY Goal: RH STG SKIN FREE OF INFECTION/BREAKDOWN Description: Remain free of breakdown and infection withmin assist 04/29/2020 0840 by Tennis Ship, RN Outcome: Completed/Met 04/29/2020 0838 by Tennis Ship, RN Outcome: Progressing   Problem: RH SAFETY Goal: RH STG ADHERE TO SAFETY PRECAUTIONS W/ASSISTANCE/DEVICE Description: STG Adhere to Safety Precautions With Assistance/Device. Mod I 04/29/2020 0840 by Tennis Ship, RN Outcome: Completed/Met 04/29/2020 0838 by Tennis Ship, RN Outcome: Progressing   Problem: Consults Goal: RH STROKE PATIENT EDUCATION Description: See Patient Education module for education specifics  04/29/2020 0840 by Tennis Ship, RN Outcome: Completed/Met 04/29/2020 0838 by Tennis Ship, RN Outcome: Progressing   Problem: RH KNOWLEDGE DEFICIT Goal: RH STG INCREASE KNOWLEDGE OF DIABETES Description: Patient will be able to manage DM and secondary stroke risks independently using handouts and educational resources 04/29/2020 0840 by Tennis Ship, RN Outcome: Completed/Met 04/29/2020 0838 by Tennis Ship, RN Outcome: Progressing Goal: RH STG INCREASE KNOWLEDGE OF HYPERTENSION Description: Patient will be able to manage HTN; medications and dietary restrictions and secondary stroke risks independently using handouts and educational resources 04/29/2020 0840 by Tennis Ship, RN Outcome: Completed/Met 04/29/2020 0838 by Tennis Ship, RN Outcome: Progressing Goal: RH STG INCREASE KNOWLEGDE OF HYPERLIPIDEMIA Description: Patient will be able to manage HLD; medications and dietary restrictions and secondary stroke risks independently using handouts and educational resources 04/29/2020 0840 by Tennis Ship, RN Outcome: Completed/Met 04/29/2020 0838 by Tennis Ship, RN Outcome: Progressing Goal: RH STG INCREASE KNOWLEDGE OF STROKE PROPHYLAXIS Description: Patient  will be able to manage risk factors and secondary stroke risks independently using handouts and educational resources; including DAPT x 3 weeks then ASA solo. 04/29/2020 0840 by Tennis Ship, RN Outcome: Completed/Met 04/29/2020 0838 by Tennis Ship, RN Outcome: Progressing   Problem: Food- and Nutrition-Related Knowledge Deficit (NB-1.1) Goal: Nutrition education Description: Formal process to instruct or train a patient/client in a skill or to impart knowledge to help patients/clients voluntarily manage or modify food choices and eating behavior to maintain or improve health. Outcome: Completed/Met

## 2020-04-29 NOTE — Discharge Instructions (Signed)
Inpatient Rehab Discharge Instructions  Jordan [name] Discharge date and time: 04/29/20   Activities/Precautions/ Functional Status: Activity: no lifting, driving, or strenuous exercise for Jordan [name] Diet: cardiac diet and diabetic diet Wound Care: none needed   Functional status:  ___ No restrictions     ___ Walk up steps independently _X__ 24/7 supervision/assistance   ___ Walk up steps with assistance ___ Intermittent supervision/assistance  ___ Bathe/dress independently ___ Walk with walker     ___ Bathe/dress with assistance ___ Walk Independently    ___ Shower independently ___ Walk with assistance    _x__ Shower with assistance _x__ No alcohol     ___ Return to work/school ________   Special Instructions: 1. Stop aspirin after 10 days.    COMMUNITY REFERRALS UPON DISCHARGE:    Home Health:   PT     OT     ST                   Agency: [facility] Phone: [phone]   Medical Equipment/Items Ordered: Rolling Walker                                                  Agency/Supplier: [employer]     STROKE/TIA DISCHARGE INSTRUCTIONS SMOKING Cigarette smoking nearly doubles your risk of having a stroke & is the single most alterable risk factor  If you smoke or have smoked in the last 12 months, you are advised to quit smoking for your health.  Most of the excess cardiovascular risk related to smoking disappears within a year of stopping.  Ask you doctor about anti-smoking medications  [place] Quit Line: 1-800-QUIT NOW  Free Smoking Cessation Classes [phone]  CHOLESTEROL Know your levels; limit fat & cholesterol in your diet  Lipid Panel     Component Value Date/Time   CHOL 115 04/15/2020 0724   TRIG 130 04/15/2020 0724   HDL 32 (L) 04/15/2020 0724   CHOLHDL 3.6 04/15/2020 0724   VLDL 26 04/15/2020 0724   LDLCALC 57 04/15/2020 0724      Many patients benefit from treatment even if their cholesterol is at goal.  Goal: Total Cholesterol (CHOL)  less than 160  Goal:  Triglycerides (TRIG) less than 150  Goal:  HDL greater than 40  Goal:  LDL (LDLCALC) less than 100   BLOOD PRESSURE American Stroke Association blood pressure target is less that 120/80 mm/Hg  Your discharge blood pressure is:  BP: (!) 141/83  Monitor your blood pressure  Limit your salt and alcohol intake  Many individuals will require more than one medication for high blood pressure  DIABETES (A1c is a blood sugar average for last 3 months) Goal HGBA1c is under 7% (HBGA1c is blood sugar average for last 3 months)  Diabetes:     Lab Results  Component Value Date   HGBA1C 7.9 (H) 04/14/2020     Your HGBA1c can be lowered with medications, healthy diet, and exercise.  Check your blood sugar as directed by your physician  Call your physician if you experience unexplained or low blood sugars.  PHYSICAL ACTIVITY/REHABILITATION Goal is 30 minutes at least 4 days per week  Activity: No driving, Therapies: SEE ABOVE Return to work: N/A  Activity decreases your risk of heart attack and stroke and makes your heart stronger.  It helps  control your weight and blood pressure; helps you relax and can improve your mood.  Participate in a regular exercise program.  Talk with your doctor about the best form of exercise for you (dancing, walking, swimming, cycling).  DIET/WEIGHT Goal is to maintain a healthy weight  Your discharge diet is:  Diet Order            Diet heart healthy/carb modified Room service appropriate? Yes; Fluid consistency: Thin  Diet effective ____                liquids Your height is:  Height: 5\' 8"  (172.7 cm) Your current weight is: Weight: 103.7 kg Your Body Mass Index (BMI) is:  BMI (Calculated): 34.77  Following the type of diet specifically designed for you will help prevent another stroke.  Your goal weight IS 164 LBS  Your goal Body Mass Index (BMI) is 19-24.  Healthy food habits can help reduce 3 risk factors for stroke:   High cholesterol, hypertension, and excess weight.  RESOURCES Stroke/Support Group:  Call [phone]   STROKE EDUCATION PROVIDED/REVIEWED AND GIVEN TO PATIENT Stroke warning signs and symptoms How to activate emergency medical system (call 911). Medications prescribed at discharge. Need for follow-up after discharge. Personal risk factors for stroke. Pneumonia vaccine given:  Flu vaccine given:  My questions have been answered, the writing is legible, and I understand these instructions.  I will adhere to these goals & educational materials that have been provided to me after my discharge from the hospital.     My questions have been answered and I understand these instructions. I will adhere to these goals and the provided educational materials after my discharge from the hospital.  Patient/Caregiver Signature _______________________________ Date __________  Clinician Signature _______________________________________ Date __________  Please bring this form and your medication list with you to all your follow-up doctor's appointments. Heart Healthy, Consistent Carbohydrate Nutrition Therapy   A heart-healthy and consistent carbohydrate diet is recommended to manage heart disease and diabetes. To follow a heart-healthy and consistent carbohydrate diet,  Eat a balanced diet with whole grains, fruits and vegetables, and lean protein sources.   Choose heart-healthy unsaturated fats. Limit saturated fats, trans fats, and cholesterol intake. Eat more plant-based or vegetarian meals using beans and soy foods for protein.   Eat whole, unprocessed foods to limit the amount of sodium (salt) you eat.   Choose a consistent amount of carbohydrate at each meal and snack. Limit refined carbohydrates especially sugar, sweets and sugar-sweetened beverages.   If you drink alcohol, do so in moderation: one serving per day (women) and two servings per day (men). o One serving is equivalent to 12  ounces beer, 5 ounces wine, or 1.5 ounces distilled spirits  Tips Tips for Choosing Heart-Healthy Fats Choose lean protein and low-fat dairy foods to reduce saturated fat intake.  Saturated fat is usually found in animal-based protein and is associated with certain health risks. Saturated fat is the biggest contributor to raise low-density lipoprotein (LDL) cholesterol levels. Research shows that limiting saturated fat lowers unhealthy cholesterol levels. Eat no more than 7% of your total calories each day from saturated fat. Ask your RDN to help you determine how much saturated fat is right for you.  There are many foods that do not contain large amounts of saturated fats. Swapping these foods to replace foods high in saturated fats will help you limit the saturated fat you eat and improve your cholesterol levels. You can also try eating more  plant-based or vegetarian meals. Instead of Try:  Whole milk, cheese, yogurt, and ice cream 1% or skim milk, low-fat cheese, non-fat yogurt, and low-fat ice cream  Fatty, marbled beef and pork Lean beef, pork, or venison  Poultry with skin Poultry without skin  Butter, stick margarine Reduced-fat, whipped, or liquid spreads  Coconut oil, palm oil Liquid vegetable oils: corn, canola, olive, soybean and safflower oils   Avoid foods that contain trans fats.  Trans fats increase levels of LDL-cholesterol. Hydrogenated fat in processed foods is the main source of trans fats in foods.   Trans fats can be found in stick margarine, shortening, processed sweets, baked goods, some fried foods, and packaged foods made with hydrogenated oils. Avoid foods with partially hydrogenated oil on the ingredient list such as: cookies, pastries, baked goods, biscuits, crackers, microwave popcorn, and frozen dinners. Choose foods with heart healthy fats.  Polyunsaturated and monounsaturated fat are unsaturated fats that may help lower your blood cholesterol level when used  in place of saturated fat in your diet.  Ask your RDN about taking a dietary supplement with plant sterols and stanols to help lower your cholesterol level.  Research shows that substituting saturated fats with unsaturated fats is beneficial to cholesterol levels. Try these easy swaps: Instead of Try:  Butter, stick margarine, or solid shortening Reduced-fat, whipped, or liquid spreads  Beef, pork, or poultry with skin Fish and seafood  Chips, crackers, snack foods Raw or unsalted nuts and seeds or nut butters Hummus with vegetables Avocado on toast  Coconut oil, palm oil Liquid vegetable oils: corn, canola, olive, soybean and safflower oils  Limit the amount of cholesterol you eat to less than 200 milligrams per day.  Cholesterol is a substance carried through the bloodstream via lipoproteins, which are known as transporters of fat. Some body functions need cholesterol to work properly, but too much cholesterol in the bloodstream can damage arteries and build up blood vessel linings (which can lead to heart attack and stroke). You should eat less than 200 milligrams cholesterol per day.  People respond differently to eating cholesterol. There is no test available right now that can figure out which people will respond more to dietary cholesterol and which will respond less. For individuals with high intake of dietary cholesterol, different types of increase (none, small, moderate, large) in LDL-cholesterol levels are all possible.   Food sources of cholesterol include egg yolks and organ meats such as liver, gizzards. Limit egg yolks to two to four per week and avoid organ meats like liver and gizzards to control cholesterol intake. Tips for Choosing Heart-Healthy Carbohydrates Consume a consistent amount of carbohydrate  It is important to eat foods with carbohydrates in moderation because they impact your blood glucose level. Carbohydrates can be found in many foods such as:  Grains  (breads, crackers, rice, pasta, and cereals)   Starchy Vegetables (potatoes, corn, and peas)   Beans and legumes   Milk, soy milk, and yogurt   Fruit and fruit juice   Sweets (cakes, cookies, ice cream, jam and jelly)  Your RDN will help you set a goal for how many carbohydrate servings to eat at your meals and snacks. For many adults, eating 3 to 5 servings of carbohydrate foods at each meal and 1 or 2 carbohydrate servings for each snack works well.   Check your blood glucose level regularly. It can tell you if you need to adjust when you eat carbohydrates.  Choose foods rich in viscous (soluble)  fiber  Viscous, or soluble, is found in the walls of plant cells. Viscous fiber is found only in plant-based foods. Eating foods with fiber helps to lower your unhealthy cholesterol and keep your blood glucose in range   Rich sources of viscous fiber include vegetables (asparagus, Brussels sprouts, sweet potatoes, turnips) fruit (apricots, mangoes, oranges), legumes, and whole grains (barley, oats, and oat bran).   As you increase your fiber intake gradually, also increase the amount of water you drink. This will help prevent constipation.   If you have difficulty achieving this goal, ask your RDN about fiber laxatives. Choose fiber supplements made with viscous fibers such as psyllium seed husks or methylcellulose to help lower unhealthy cholesterol.   Limit refined carbohydrates   There are three types of carbohydrates: starches, sugar, and fiber. Some carbohydrates occur naturally in food, like the starches in rice or corn or the sugars in fruits and milk. Refined carbohydrates--foods with high amounts of simple sugars--can raise triglyceride levels. High triglyceride levels are associated with coronary heart disease.  Some examples of refined carbohydrate foods are table sugar, sweets, and beverages sweetened with added sugar. Tips for Reducing Sodium (Salt) Although sodium is important  for your body to function, too much sodium can be harmful for people with high blood pressure. As sodium and fluid buildup in your tissues and bloodstream, your blood pressure increases. High blood pressure may cause damage to other organs and increase your risk for a stroke. Even if you take a pill for blood pressure or a water pill (diuretic) to remove fluid, it is still important to have less salt in your diet. Ask your doctor and RDN what amount of sodium is right for you.  Avoid processed foods. Eat more fresh foods.   Fresh fruits and vegetables are naturally low in sodium, as well as frozen vegetables and fruits that have no added juices or sauces.   Fresh meats are lower in sodium than processed meats, such as bacon, sausage, and hotdogs. Read the nutrition label or ask your butcher to help you find a fresh meat that is low in sodium.  Eat less salt--at the table and when cooking.   A single teaspoon of table salt has 2,300 mg of sodium.   Leave the salt out of recipes for pasta, casseroles, and soups.   Ask your RDN how to cook your favorite recipes without sodium  Be a smart shopper.   Look for food packages that say salt-free or sodium-free. These items contain less than 5 milligrams of sodium per serving.   Very low-sodium products contain less than 35 milligrams of sodium per serving.   Low-sodium products contain less than 140 milligrams of sodium per serving.   Beware for Unsalted or No Added Salt products. These items may still be high in sodium. Check the nutrition label.  Add flavors to your food without adding sodium.   Try lemon juice, lime juice, fruit juice or vinegar.   Dry or fresh herbs add flavor. Try basil, bay leaf, dill, rosemary, parsley, sage, dry mustard, nutmeg, thyme, and paprika.   Pepper, red pepper flakes, and cayenne pepper can add spice t your meals without adding sodium. Hot sauce contains sodium, but if you use just a drop or two,  it will not add up to much.   Buy a sodium-free seasoning blend or make your own at home. Additional Lifestyle Tips Achieve and maintain a healthy weight.  Talk with your RDN or your doctor about  what is a healthy weight for you.  Set goals to reach and maintain that weight.   To lose weight, reduce your calorie intake along with increasing your physical activity. A weight loss of 10 to 15 pounds could reduce LDL-cholesterol by 5 milligrams per deciliter. Participate in physical activity.  Talk with your health care team to find out what types of physical activity are best for you. Set a plan to get about 30 minutes of exercise on most days.  Foods Recommended Food Group Foods Recommended  Grains Whole grain breads and cereals, including whole wheat, barley, rye, buckwheat, corn, teff, quinoa, millet, amaranth, brown or wild rice, sorghum, and oats Pasta, especially whole wheat or other whole grain types  [employer], quinoa or wild rice Whole grain crackers, bread, rolls, pitas Home-made bread with reduced-sodium baking soda  Protein Foods Lean cuts of beef and pork (loin, leg, round, extra lean hamburger)  Skinless Cytogeneticist and other wild game Dried beans and peas Nuts and nut butters Meat alternatives made with soy or textured vegetable protein  Egg whites or egg substitute Cold cuts made with lean meat or soy protein  Dairy Nonfat (skim), low-fat, or 1%-fat milk  Nonfat or low-fat yogurt or cottage cheese Fat-free and low-fat cheese  Vegetables Fresh, frozen, or canned vegetables without added fat or salt   Fruits Fresh, frozen, canned, or dried fruit   Oils Unsaturated oils (corn, olive, peanut, soy, sunflower, canola)  Soft or liquid margarines and vegetable oil spreads  Salad dressings Seeds and nuts  Avocado   Foods Not Recommended Food Group Foods Not Recommended  Grains Breads or crackers topped with salt Cereals (hot or cold) with more than 300 mg  sodium per serving Biscuits, cornbread, and other quick breads prepared with baking soda Bread crumbs or stuffing mix from a store High-fat bakery products, such as doughnuts, biscuits, croissants, danish pastries, pies, cookies Instant cooking foods to which you add hot water and stir--potatoes, noodles, rice, etc. Packaged starchy foods--seasoned noodle or rice dishes, stuffing mix, macaroni and cheese dinner Snacks made with partially hydrogenated oils, including chips, cheese puffs, snack mixes, regular crackers, butter-flavored popcorn  Protein Foods Higher-fat cuts of meats (ribs, t-bone steak, regular hamburger) Bacon, sausage, or hot dogs Cold cuts, such as salami or bologna, deli meats, cured meats, corned beef Organ meats (liver, brains, gizzards, sweetbreads) Poultry with skin Fried or smoked meat, poultry, and fish Whole eggs and egg yolks (more than 2-4 per week) Salted legumes, nuts, seeds, or nut/seed butters Meat alternatives with high levels of sodium (>300 mg per serving) or saturated fat (>5 g per serving)  Dairy Whole milk,?2% fat milk, buttermilk Whole milk yogurt or ice cream Cream Half-&-half Cream cheese Sour cream Cheese  Vegetables Canned or frozen vegetables with salt, fresh vegetables prepared with salt, butter, cheese, or cream sauce Fried vegetables Pickled vegetables such as olives, pickles, or sauerkraut  Fruits Fried fruits Fruits served with butter or cream  Oils Butter, stick margarine, shortening Partially hydrogenated oils or trans fats Tropical oils (coconut, palm, palm kernel oils)  Other Candy, sugar sweetened soft drinks and desserts Salt, sea salt, garlic salt, and seasoning mixes containing salt Bouillon cubes Ketchup, barbecue sauce, Worcestershire sauce, soy sauce, teriyaki sauce Miso Salsa Pickles, olives, relish   Heart Healthy Consistent Carbohydrate Vegetarian (Lacto-Ovo) Sample 1-Day Menu  Breakfast 1 cup oatmeal, cooked (2  carbohydrate servings)   cup blueberries (1 carbohydrate serving)  11 almonds, without salt  1  cup 1% milk (1 carbohydrate serving)  1 cup coffee  Morning Snack 1 cup fat-free plain yogurt (1 carbohydrate serving)  Lunch 1 whole wheat bun (1 carbohydrate servings)  1 black bean burger (1 carbohydrate servings)  1 slice cheddar cheese, low sodium  2 slices tomatoes  2 leaves lettuce  1 teaspoon mustard  1 small pear (1 carbohydrate servings)  1 cup green tea, unsweetened  Afternoon Snack 1/3 cup trail mix with nuts, seeds, and raisins, without salt (1 carbohydrate servinga)  Evening Meal  cup meatless chicken  2/3 cup brown rice, cooked (2 carbohydrate servings)  1 cup broccoli, cooked (2/3 carbohydrate serving)   cup carrots, cooked (1/3 carbohydrate serving)  2 teaspoons olive oil  1 teaspoon balsamic vinegar  1 whole wheat dinner roll (1 carbohydrate serving)  1 teaspoon margarine, soft, tub  1 cup 1% milk (1 carbohydrate serving)  Evening Snack 1 extra small banana (1 carbohydrate serving)  1 tablespoon peanut butter   Heart Healthy Consistent Carbohydrate Vegan Sample 1-Day Menu  Breakfast 1 cup oatmeal, cooked (2 carbohydrate servings)   cup blueberries (1 carbohydrate serving)  11 almonds, without salt  1 cup soymilk fortified with calcium, vitamin B12, and vitamin D  1 cup coffee  Morning Snack 6 ounces soy yogurt (1 carbohydrate servings)  Lunch 1 whole wheat bun(1 carbohydrate servings)  1 black bean burger (1 carbohydrate serving)  2 slices tomatoes  2 leaves lettuce  1 teaspoon mustard  1 small pear (1 carbohydrate servings)  1 cup green tea, unsweetened  Afternoon Snack 1/3 cup trail mix with nuts, seeds, and raisins, without salt (1 carbohydrate servings)  Evening Meal  cup meatless chicken  2/3 cup brown rice, cooked (2 carbohydrate servings)  1 cup broccoli, cooked (2/3 carbohydrate serving)   cup carrots, cooked (1/3 carbohydrate serving)  2  teaspoons olive oil  1 teaspoon balsamic vinegar  1 whole wheat dinner roll (1 carbohydrate serving)  1 teaspoon margarine, soft, tub  1 cup soymilk fortified with calcium, vitamin B12, and vitamin D  Evening Snack 1 extra small banana (1 carbohydrate serving)  1 tablespoon peanut butter    Heart Healthy Consistent Carbohydrate Sample 1-Day Menu  Breakfast 1 cup cooked oatmeal (2 carbohydrate servings)  3/4 cup blueberries (1 carbohydrate serving)  1 ounce almonds  1 cup skim milk (1 carbohydrate serving)  1 cup coffee  Morning Snack 1 cup sugar-free nonfat yogurt (1 carbohydrate serving)  Lunch 2 slices whole-wheat bread (2 carbohydrate servings)  2 ounces lean [place] breast  1 ounce low-fat Swiss cheese  1 teaspoon mustard  1 slice tomato  1 lettuce leaf  1 small pear (1 carbohydrate serving)  1 cup skim milk (1 carbohydrate serving)  Afternoon Snack 1 ounce trail mix with unsalted nuts, seeds, and raisins (1 carbohydrate serving)  Evening Meal 3 ounces salmon  2/3 cup cooked brown rice (2 carbohydrate servings)  1 teaspoon soft margarine  1 cup cooked broccoli with 1/2 cup cooked carrots (1 carbohydrate serving  Carrots, cooked, boiled, drained, without salt  1 cup lettuce  1 teaspoon olive oil with vinegar for dressing  1 small whole grain roll (1 carbohydrate serving)  1 teaspoon soft margarine  1 cup unsweetened tea  Evening Snack 1 extra-small banana (1 carbohydrate serving)  Copyright 2020  [employer]. All rights reserved.

## 2020-04-29 NOTE — Progress Notes (Signed)
[place] PHYSICAL MEDICINE & REHABILITATION PROGRESS NOTE   Subjective/Complaints: Patient seen sitting up in bed this morning.  He states he slept well overnight.  The same questions as yesterday regarding returning to driving.  He is ready for discharge-stated that this will be addressed as an outpatient and to avoid driving at present.  ROS: Denies CP, SOB, N/V/D  Objective:   No results found. Recent Labs    04/28/20 0538  WBC 9.1  HGB 13.7  HCT 41.9  PLT 238   Recent Labs    04/28/20 0538  NA 139  K 4.1  CL 107  CO2 22  GLUCOSE 135*  BUN 21  CREATININE 1.13  CALCIUM 9.2    Intake/Output Summary (Last 24 hours) at 04/29/2020 0840 Last data filed at 04/29/2020 1610 Gross per 24 hour  Intake 520 ml  Output 850 ml  Net -330 ml     Physical Exam: Vital Signs Blood pressure (!) 141/83, pulse 77, temperature 97.8 F (36.6 C), temperature source Oral, resp. rate 18, height 5\' 8"  (1.727 m), weight 103.7 kg, SpO2 96 %. Constitutional: No distress . Vital signs reviewed. HENT: Normocephalic.  Atraumatic. Eyes: EOMI. No discharge. Cardiovascular: No JVD. Respiratory: Normal effort.  No stridor. GI: Non-distended. Skin: Palm lesions healing. Psych: Normal mood.  Normal behavior. Musc: No edema in extremities.  No tenderness in extremities. Neurological: Alert Motor: LUE/LLE: 5/5 proximal distal RUE/RLE: 4+-5/5 proximal distal, unchanged  Assessment/Plan: 1. 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  Physiatrist is providing close team supervision and 24 hour management of active medical problems listed below.  Physiatrist and rehab team continue to assess barriers to discharge/monitor patient progress toward functional and medical goals  Care Tool:  Bathing    Body parts bathed by patient: Right arm, Left arm, Chest, Abdomen, Front perineal area,  Buttocks, Right upper leg, Left upper leg, Right lower leg, Left lower leg, Face         Bathing assist Assist Level: Set up assist     Upper Body Dressing/Undressing Upper body dressing   What is the patient wearing?: Pull over shirt    Upper body assist Assist Level: Independent    Lower Body Dressing/Undressing Lower body dressing      What is the patient wearing?: Underwear/pull up, Pants     Lower body assist Assist for lower body dressing: Independent with assitive device Assistive Device Comment: RW for support   Toileting Toileting Toileting Activity did not occur (Clothing management and hygiene only): N/A (no void or bm)  Toileting assist Assist for toileting: Independent with assistive device Assistive Device Comment: RW for support   Transfers Chair/bed transfer  Transfers assist     Chair/bed transfer assist level: Independent with assistive device Chair/bed transfer assistive device: Programmer, multimedia   Ambulation assist      Assist level: Independent with assistive device Assistive device: Walker-rolling Max distance: 141ft   Walk 10 feet activity   Assist     Assist level: Independent with assistive device Assistive device: Walker-rolling   Walk 50 feet activity   Assist    Assist level: Independent with assistive device Assistive device: Walker-rolling    Walk 150 feet activity   Assist Walk 150 feet activity did not occur: Safety/medical concerns  Assist level: Independent with assistive device Assistive device: Walker-rolling    Walk 10 feet on uneven surface  activity  Assist     Assist level: Supervision/Verbal cueing Assistive device: Aeronautical engineer Will patient use wheelchair at discharge?: No             Wheelchair 50 feet with 2 turns activity    Assist            Wheelchair 150 feet activity     Assist          Blood pressure (!)  141/83, pulse 77, temperature 97.8 F (36.6 C), temperature source Oral, resp. rate 18, height 5\' 8"  (1.727 m), weight 103.7 kg, SpO2 96 %.  Medical Problem List and Plan: 1.  Deficits with mobility, endurance, balance, self-care secondary to acute left thalamic infarct.  DC today  Will see patient for transitional care management in 1-2 weeks post-discharge, particularly to ensure safety and appropriate home accommodations given wife's plan for surgery with home layout and limited support from son 2.  Antithrombotics: -DVT/anticoagulation:  Pharmaceutical: Lovenox stopped as ambulating >150 feet.              -antiplatelet therapy: DAPT.  3. Pain Management: N/A.  4. Mood: LCSW to follow for evaluation and support.              -antipsychotic agents: N/A 5. Neuropsych: This patient is capable of making decisions on his own behalf. 6. Skin/Wound Care: routine pressure relief measures.  7. Fluids/Electrolytes/Nutrition: Monitor I/Os.   8. HTN: Monitor BP   off lisinopril at this time.   Relatively controlled on 6/29             Monitor with increased mobility and with medications as necessary. 9.T2DM with hyperglycemia: Hgb A1C- 7.9. Resumed metformin as patient not interested in insulin.   Relatively controlled on 6/29  See #13             Monitor with increased mobility. 10. Dyslipidemia: Continue Lipitor. 11. 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  13. Morbid obesity: Encouraged weight loss. Patient interested in losing weight and thereby decreasing his symptoms of OSA.  Has been educated regarding low carb diet and decreased snacking and provided handouts.  14. OSA: CPAP at night- patient is bringing his from home.  15.  Hypoalbuminemia  Supplement initiated on 6/21 16.  Skin lesions in bilateral volar hands  Continue alternate between Aquaphor and steroid cream  Improving 17.  Cough with laughter, now with some congestion  Accapella ordered  Improving 18. AKI  Cr.  1.13 on 6/28, follow-up as outpatient  Encourage fluids  > 30 minutes spent in total in discharge planning between myself and PA regarding aforementioned, as well discussion regarding DME equipment, follow-up appointments, follow-up therapies, discharge medications, discharge recommendations, and driving recommendations, home layout and adjustments  LOS: 11 days A FACE TO FACE EVALUATION WAS PERFORMED  Jordan [name] Lorie Phenix 04/29/2020, 8:40 AM

## 2020-04-29 NOTE — Progress Notes (Signed)
Nutrition Education Note  RD was approached by PA as pt has questions regarding his diet at discharge.  Spoke with pt and family members at bedside. Pt with questions about meal delivery services that will adhere to his diet restrictions. Pt states that he does not have a sense of smell which impacts his sense of taste. He does not salt his foods.  Lab Results  Component Value Date   HGBA1C 7.9 (H) 04/14/2020    RD provided "Heart Healthy, Carb Modified Diet" handout from the Academy of Nutrition and Dietetics. Discussed different food groups and their effects on blood sugar, emphasizing carbohydrate-containing foods. Provided list of carbohydrates and recommended serving sizes of common foods.  Discussed importance of controlled and consistent carbohydrate intake throughout the day. Provided examples of ways to balance meals/snacks and encouraged intake of high-fiber, whole grain complex carbohydrates. Teach back method used.  Lipid Panel     Component Value Date/Time   CHOL 115 04/15/2020 0724   TRIG 130 04/15/2020 0724   HDL 32 (L) 04/15/2020 0724   CHOLHDL 3.6 04/15/2020 0724   VLDL 26 04/15/2020 0724   LDLCALC 57 04/15/2020 0724    Provided examples on ways to decrease sodium and fat intake in diet. Discouraged intake of processed foods and use of salt shaker. Encouraged fresh fruits and vegetables as well as whole grain sources of carbohydrates to maximize fiber intake. Teach back method used.  Expect fair to good compliance.  Body mass index is 34.76 kg/m. Pt meets criteria for obesity class I based on current BMI.  Current diet order is Heart Healthy/Carb Modified, patient is consuming approximately 100% of meals at this time. Labs and medications reviewed. No further nutrition interventions warranted at this time. RD contact information provided. If additional nutrition issues arise, please re-consult RD.   Gaynell Face, MS, RD, LDN Inpatient Clinical  Dietitian Pager: [phone] Weekend/After Hours: [phone]

## 2020-04-29 NOTE — Discharge Summary (Addendum)
Physician Discharge Summary  Patient ID: Jordan [name] MRN: [id number] DOB/AGE: 80-Jun-1942 37 y.o.  Admit date: 04/18/2020 Discharge date: 04/29/2020  Discharge Diagnoses:  Principal Problem:   Thalamic stroke [facility]) Active Problems:   OSA on CPAP   Hypoalbuminemia due to protein-calorie malnutrition (HCC)   Controlled type 2 diabetes mellitus with hyperglycemia, without long-term current use of insulin (HCC)   Skin lesion of hand   Benign essential HTN   Cough   AKI (acute kidney injury) ([place])   Discharged Condition:  Stable   Significant Diagnostic Studies: N/A   Labs:  Basic Metabolic Panel: BMP Latest Ref Rng & Units 04/28/2020 04/21/2020 04/19/2020  Glucose 70 - 99 mg/dL 135(H) 129(H) 138(H)  BUN 8 - 23 mg/dL 21 18 16   Creatinine 0.61 - 1.24 mg/dL 1.13 1.02 0.86  Sodium 135 - 145 mmol/L 139 139 139  Potassium 3.5 - 5.1 mmol/L 4.1 4.0 3.8  Chloride 98 - 111 mmol/L 107 107 108  CO2 22 - 32 mmol/L 22 21(L) 21(L)  Calcium 8.9 - 10.3 mg/dL 9.2 9.5 9.2    CBC: CBC Latest Ref Rng & Units 04/28/2020 04/21/2020 04/19/2020  WBC 4.0 - 10.5 K/uL 9.1 8.6 9.9  Hemoglobin 13.0 - 17.0 g/dL 13.7 14.3 13.1  Hematocrit 39 - 52 % 41.9 44.1 39.9  Platelets 150 - 400 K/uL 238 263 263    CBG: Recent Labs  Lab 04/28/20 1213 04/28/20 1646 04/28/20 2121 04/29/20 0606 04/29/20 1129  GLUCAP 119* 138* 141* 108* 156*    Brief HPI:   JONN CHAIKIN is a 79 y.o. RH-male with history of CAD, T2DM OSA, SSS s/p PPM who was admitted on 04/14/20 with right sided numbness and tingling.  Blood pressure was elevated at admission and CT head was negative for acute process.  MRI brain done showing acute left thalamic infarct and PPM was interrogated and no events noted.  Dr. Erlinda Hong felt the stroke was secondary to small vessel disease and aspirin was added to Plavix with recommendations to stop aspirin after 3 weeks.  Blood pressures will be monitored with permissive hypertension and Lantus was added for  management of blood sugars.  Hospital course was further complicated by mild drop in H&H.  Therapies were ongoing and patient continued to have deficits in balance and safety affecting ADLs and mobility.  CIR was recommended due to functional decline.   Hospital Course: JAGO CARTON was admitted to rehab 04/18/2020 for inpatient therapies to consist of PT, ST and OT at least three hours five days a week. Past admission physiatrist, therapy team and rehab RN have worked together to provide customized collaborative inpatient rehab. He was maintained on DAPT during his stay. His blood pressures were monitored on TID basis and has been controlled off lisinopril. Follow up BMET showed that lytes and renals status is WNL. Follow up CBC showed that H/H is stable. He is continent of B/B. Diabetes has been monitored with ac/hs CBG checks and metformin was resumed to manage mange BS.  He has been educated on weight loss and carb modified diet.  He was advised to bring CPAP from home to help with complete compliance of use.  He was found to have skin lesion on bilateral volar hands which has been treated with Aquaphor alternating with steroid creams.  Athlete's foot has been treated with Lamisil twice daily and he was educated on importance of maintaining good foot hygiene.   Acapella was ordered to help with cough reported with laughter  as well as congestion. He has made goo progress during his stay and is modified independent at discharge. He will continue to receive follow up HHPT and [place] [facility] after discharge.    Rehab course: During patient's stay in rehab team conference was held to monitor patient's progress, set goals and discuss barriers to discharge. At admission, patient required min assist with mobility and with ADL tasks. He has had improvement in activity tolerance, balance, postural control as well as ability to compensate for deficits. He has had improvement in functional use RUE  and  RLEas well as improvement in awareness.  He is able to complete ADL tasks and requires supervision with tub transfers. He is modified independent for transfers and to ambulate 250 feet with assistive device.  He is able to navigate 12 stairs with verbal cues.  Family education was completed regarding all aspects of safety and care.   Disposition: Home  Diet: Heart Healthy/Carb Modified.   Special Instructions: 1. No driving or strenuous activity till cleared by MD. 2.  Stop aspirin after 10 days.  Discharge Instructions     Ambulatory referral to Physical Medicine Rehab   Complete by: As directed    1-2 WEEKS TC APPT      Allergies as of 04/29/2020   No Known Allergies      Medication List     STOP taking these medications    furosemide 40 MG tablet Commonly known as: Lasix   glucosamine-chondroitin 500-400 MG tablet   lisinopril 10 MG tablet Commonly known as: ZESTRIL       TAKE these medications    amphetamine-dextroamphetamine 10 MG tablet Commonly known as: ADDERALL 1-3 tabs daily as needed What changed:  how much to take how to take this when to take this   aspirin 81 MG EC tablet Take 1 tablet (81 mg total) by mouth daily for 10 days. Swallow whole.   atorvastatin 40 MG tablet Commonly known as: LIPITOR Take 0.5 tablets (20 mg total) by mouth daily before breakfast.   clopidogrel 75 MG tablet Commonly known as: PLAVIX Take 1 tablet (75 mg total) by mouth daily.   metFORMIN 500 MG 24 hr tablet Commonly known as: GLUCOPHAGE-XR Take 4 tablets (2,000 mg total) by mouth daily with breakfast.   multivitamin with minerals Tabs tablet Take 1 tablet by mouth daily.   Nitrostat 0.4 MG SL tablet Generic drug: nitroGLYCERIN PLACE 1 TABLET (0.4 MG TOTAL) UNDER THE TONGUE EVERY 5 (FIVE) MINUTES AS NEEDED FOR CHEST PAIN. What changed: See the new instructions.   terbinafine 1 % cream Commonly known as: LAMISIL Apply topically 2 (two) times daily. Notes  to patient: To bilateral feet        Follow-up Information     Jamse Arn, MD Follow up.   Specialty: Physical Medicine and Rehabilitation Why: OFFICE WILL CALL YOU WITH FOLLOW UP APPOINTMENT Contact information: [address] [place] [address] [phone]         [name], MD. Call in 1 day(s).   Specialty: Family Medicine Why: [place] FOLLOW UP Contact information: [place] [address] [phone]         [facility] Follow up.   Why: FOR STROKE FOLLOW UP Contact information: [address]     [place] [id number] [phone]                Signed:  Bary Leriche 05/02/2020, 12:45 PM Patient was seen, face-face, and physical exam performed by me on day of discharge, greater than 30 minutes of total time spent.. Please see progress note from day of discharge as well.  Delice Lesch, MD, ABPMR

## 2020-05-01 ENCOUNTER — Ambulatory Visit (INDEPENDENT_AMBULATORY_CARE_PROVIDER_SITE_OTHER): Payer: Medicare HMO | Admitting: *Deleted

## 2020-05-01 DIAGNOSIS — R001 Bradycardia, unspecified: Secondary | ICD-10-CM

## 2020-05-01 LAB — CUP PACEART REMOTE DEVICE CHECK
Battery Remaining Percentage: 70 %
Brady Statistic RA Percent Paced: 95 %
Brady Statistic RV Percent Paced: 15 %
Date Time Interrogation Session: 20210701060312
Implantable Lead Implant Date: 20170525
Implantable Lead Implant Date: 20170525
Implantable Lead Location: 753859
Implantable Lead Location: 753860
Implantable Lead Model: 377
Implantable Lead Model: 377
Implantable Lead Serial Number: 49470224
Implantable Lead Serial Number: 49485613
Implantable Pulse Generator Implant Date: 20170525
Lead Channel Impedance Value: 507 Ohm
Lead Channel Impedance Value: 546 Ohm
Lead Channel Pacing Threshold Amplitude: 1.3 V
Lead Channel Pacing Threshold Pulse Width: 0.4 ms
Lead Channel Sensing Intrinsic Amplitude: 3.9 mV
Lead Channel Sensing Intrinsic Amplitude: 7.9 mV
Lead Channel Setting Pacing Amplitude: 2.4 V
Lead Channel Setting Pacing Amplitude: 2.4 V
Lead Channel Setting Pacing Pulse Width: 0.4 ms
Pulse Gen Model: 394969
Pulse Gen Serial Number: 68798589

## 2020-05-02 ENCOUNTER — Telehealth: Payer: Self-pay

## 2020-05-02 DIAGNOSIS — I10 Essential (primary) hypertension: Secondary | ICD-10-CM | POA: Diagnosis not present

## 2020-05-02 DIAGNOSIS — D509 Iron deficiency anemia, unspecified: Secondary | ICD-10-CM | POA: Diagnosis not present

## 2020-05-02 DIAGNOSIS — I081 Rheumatic disorders of both mitral and tricuspid valves: Secondary | ICD-10-CM | POA: Diagnosis not present

## 2020-05-02 DIAGNOSIS — E119 Type 2 diabetes mellitus without complications: Secondary | ICD-10-CM | POA: Diagnosis not present

## 2020-05-02 DIAGNOSIS — I639 Cerebral infarction, unspecified: Secondary | ICD-10-CM | POA: Diagnosis not present

## 2020-05-02 DIAGNOSIS — M170 Bilateral primary osteoarthritis of knee: Secondary | ICD-10-CM | POA: Diagnosis not present

## 2020-05-02 DIAGNOSIS — I2510 Atherosclerotic heart disease of native coronary artery without angina pectoris: Secondary | ICD-10-CM | POA: Diagnosis not present

## 2020-05-02 DIAGNOSIS — M19011 Primary osteoarthritis, right shoulder: Secondary | ICD-10-CM | POA: Diagnosis not present

## 2020-05-02 DIAGNOSIS — I495 Sick sinus syndrome: Secondary | ICD-10-CM | POA: Diagnosis not present

## 2020-05-02 DIAGNOSIS — I69351 Hemiplegia and hemiparesis following cerebral infarction affecting right dominant side: Secondary | ICD-10-CM | POA: Diagnosis not present

## 2020-05-02 NOTE — Progress Notes (Signed)
Remote pacemaker transmission.   

## 2020-05-02 NOTE — Telephone Encounter (Signed)
[place], PT/[facility] called requesting verbal order 1wk1, 2wk2, 1wk2. Orders approved and given.

## 2020-05-06 DIAGNOSIS — I10 Essential (primary) hypertension: Secondary | ICD-10-CM | POA: Diagnosis not present

## 2020-05-06 DIAGNOSIS — E7800 Pure hypercholesterolemia, unspecified: Secondary | ICD-10-CM | POA: Diagnosis not present

## 2020-05-06 DIAGNOSIS — I081 Rheumatic disorders of both mitral and tricuspid valves: Secondary | ICD-10-CM | POA: Diagnosis not present

## 2020-05-06 DIAGNOSIS — I679 Cerebrovascular disease, unspecified: Secondary | ICD-10-CM | POA: Diagnosis not present

## 2020-05-06 DIAGNOSIS — M19011 Primary osteoarthritis, right shoulder: Secondary | ICD-10-CM | POA: Diagnosis not present

## 2020-05-06 DIAGNOSIS — I495 Sick sinus syndrome: Secondary | ICD-10-CM | POA: Diagnosis not present

## 2020-05-06 DIAGNOSIS — E119 Type 2 diabetes mellitus without complications: Secondary | ICD-10-CM | POA: Diagnosis not present

## 2020-05-06 DIAGNOSIS — Z7984 Long term (current) use of oral hypoglycemic drugs: Secondary | ICD-10-CM | POA: Diagnosis not present

## 2020-05-06 DIAGNOSIS — I2510 Atherosclerotic heart disease of native coronary artery without angina pectoris: Secondary | ICD-10-CM | POA: Diagnosis not present

## 2020-05-06 DIAGNOSIS — I69351 Hemiplegia and hemiparesis following cerebral infarction affecting right dominant side: Secondary | ICD-10-CM | POA: Diagnosis not present

## 2020-05-06 DIAGNOSIS — M170 Bilateral primary osteoarthritis of knee: Secondary | ICD-10-CM | POA: Diagnosis not present

## 2020-05-06 DIAGNOSIS — D509 Iron deficiency anemia, unspecified: Secondary | ICD-10-CM | POA: Diagnosis not present

## 2020-05-08 DIAGNOSIS — E119 Type 2 diabetes mellitus without complications: Secondary | ICD-10-CM | POA: Diagnosis not present

## 2020-05-08 DIAGNOSIS — I10 Essential (primary) hypertension: Secondary | ICD-10-CM | POA: Diagnosis not present

## 2020-05-08 DIAGNOSIS — I2510 Atherosclerotic heart disease of native coronary artery without angina pectoris: Secondary | ICD-10-CM | POA: Diagnosis not present

## 2020-05-08 DIAGNOSIS — I495 Sick sinus syndrome: Secondary | ICD-10-CM | POA: Diagnosis not present

## 2020-05-08 DIAGNOSIS — M170 Bilateral primary osteoarthritis of knee: Secondary | ICD-10-CM | POA: Diagnosis not present

## 2020-05-08 DIAGNOSIS — I081 Rheumatic disorders of both mitral and tricuspid valves: Secondary | ICD-10-CM | POA: Diagnosis not present

## 2020-05-08 DIAGNOSIS — I69351 Hemiplegia and hemiparesis following cerebral infarction affecting right dominant side: Secondary | ICD-10-CM | POA: Diagnosis not present

## 2020-05-08 DIAGNOSIS — D509 Iron deficiency anemia, unspecified: Secondary | ICD-10-CM | POA: Diagnosis not present

## 2020-05-08 DIAGNOSIS — M19011 Primary osteoarthritis, right shoulder: Secondary | ICD-10-CM | POA: Diagnosis not present

## 2020-05-09 ENCOUNTER — Encounter: Payer: Self-pay | Admitting: Registered Nurse

## 2020-05-09 ENCOUNTER — Encounter: Payer: Medicare HMO | Attending: Registered Nurse | Admitting: Registered Nurse

## 2020-05-09 ENCOUNTER — Other Ambulatory Visit: Payer: Self-pay

## 2020-05-09 VITALS — BP 107/68 | HR 70 | Temp 98.9°F | Ht 68.0 in | Wt 227.4 lb

## 2020-05-09 DIAGNOSIS — I69351 Hemiplegia and hemiparesis following cerebral infarction affecting right dominant side: Secondary | ICD-10-CM | POA: Diagnosis not present

## 2020-05-09 DIAGNOSIS — E1165 Type 2 diabetes mellitus with hyperglycemia: Secondary | ICD-10-CM | POA: Diagnosis not present

## 2020-05-09 DIAGNOSIS — I10 Essential (primary) hypertension: Secondary | ICD-10-CM

## 2020-05-09 DIAGNOSIS — M19011 Primary osteoarthritis, right shoulder: Secondary | ICD-10-CM | POA: Diagnosis not present

## 2020-05-09 DIAGNOSIS — I495 Sick sinus syndrome: Secondary | ICD-10-CM | POA: Diagnosis not present

## 2020-05-09 DIAGNOSIS — I639 Cerebral infarction, unspecified: Secondary | ICD-10-CM | POA: Diagnosis not present

## 2020-05-09 DIAGNOSIS — I081 Rheumatic disorders of both mitral and tricuspid valves: Secondary | ICD-10-CM | POA: Diagnosis not present

## 2020-05-09 DIAGNOSIS — M170 Bilateral primary osteoarthritis of knee: Secondary | ICD-10-CM | POA: Diagnosis not present

## 2020-05-09 DIAGNOSIS — I6381 Other cerebral infarction due to occlusion or stenosis of small artery: Secondary | ICD-10-CM

## 2020-05-09 DIAGNOSIS — E119 Type 2 diabetes mellitus without complications: Secondary | ICD-10-CM | POA: Diagnosis not present

## 2020-05-09 DIAGNOSIS — I2510 Atherosclerotic heart disease of native coronary artery without angina pectoris: Secondary | ICD-10-CM | POA: Diagnosis not present

## 2020-05-09 DIAGNOSIS — D509 Iron deficiency anemia, unspecified: Secondary | ICD-10-CM | POA: Diagnosis not present

## 2020-05-09 NOTE — Progress Notes (Signed)
Subjective:    Patient ID: KANNEN MOXEY, male    DOB: 1941-09-12, 79 y.o.   MRN: [id number]  HPI: HAWTHORNE DAY is a 79 y.o. male who is here for hospital follow up of his Thalamic Stroke, Essential Hypertension and Controlled Type 2 DM with hyperglycemia, without long-term current use of insulin. He presented to ED on 04/14/2020 right sided numbness and tingling. Neurology Consulted.  CT Head Code Stroke WO Contrast:  IMPRESSION: 1. No evidence of acute intracranial abnormality. 2. ASPECTS is 10. 3. 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 4. Chronic appearing left-sided sinusitis in an ostiomeatal unit obstruction pattern.  CT Angio Head and Neck W or WO Contrast:  IMPRESSION: 1. No emergent large vessel occlusion or high-grade stenosis of the intracranial or cervical arteries. 2. Aortic Atherosclerosis (ICD10-I70.0).  MR Brain WO Contrast:  IMPRESSION: Acute infarct left thalamus  Atrophy and chronic ischemic changes as above.  Mr. Lambert was admitted to inpatient Rehabilitation on 04/18/2020 and discharged home on 04/29/2020. He is receiving Home Heath Therapy with [facility] He denies any pain. He rated his pain 0. Also reports he has a good appetite.  Mr. Middleton wants to resume driving, Dr. Ranell Patrick assessed Mr. Dunivan for driving, she gave Mr. Bullinger driving instructions and he verbalizes understanding.   Pain Inventory Average Pain 0 Pain Right Now 0 My pain is other  In the last 24 hours, has pain interfered with the following? General activity 0 Relation with others 0 Enjoyment of life 0 What TIME of day is your pain at its worst? na Sleep (in general) Good  Pain is worse with: na Pain improves with: na Relief from Meds: na  Mobility walk with assistance use a walker do you drive?  no  Function retired  Neuro/Psych loss of taste or smell  Prior Studies CT/MRI  Physicians involved in your  care Any changes since last visit?  no   Family History  Problem Relation Age of Onset  . Sleep apnea Sister   . Colon cancer Sister    Social History   Socioeconomic History  . Marital status: Married    Spouse name: Not on file  . Number of children: Not on file  . Years of education: Not on file  . Highest education level: Not on file  Occupational History  . Occupation: Chiropodist  . Occupation: [place] [employer]  Tobacco Use  . Smoking status: Former Smoker    Packs/day: 2.50    Years: 6.00    Pack years: 15.00    Types: Cigarettes    Quit date: 06/05/1967    Years since quitting: 52.9  . Smokeless tobacco: Never Used  Substance and Sexual Activity  . Alcohol use: Yes    Comment: rare  . Drug use: No  . Sexual activity: Not Currently  Other Topics Concern  . Not on file  Social History Narrative  . Not on file   Social Determinants of Health   Financial Resource Strain:   . Difficulty of Paying Living Expenses:   Food Insecurity:   . Worried About Charity fundraiser in the Last Year:   . Arboriculturist in the Last Year:   Transportation Needs:   . Film/video editor (Medical):   [name] Lack of Transportation (Non-Medical):   Physical Activity:   . Days of Exercise per Week:   . Minutes of Exercise per Session:   Stress:   .  Feeling of Stress :   Social Connections:   . Frequency of Communication with Friends and Family:   . Frequency of Social Gatherings with Friends and Family:   . Attends Religious Services:   . Active Member of Clubs or Organizations:   . Attends Archivist Meetings:   [name] Marital Status:    Past Surgical History:  Procedure Laterality Date  . CARDIAC CATHETERIZATION  2009?  . EP IMPLANTABLE DEVICE N/A 03/25/2016   Procedure: Pacemaker Implant - Dual Chamber;  Surgeon: Evans Lance, MD;  Location: [facility];  Service: Cardiovascular;  Laterality: N/A;  . FRACTURE SURGERY    . INSERT / REPLACE /  REMOVE PACEMAKER  03/24/2016  . LEFT AND RIGHT HEART CATHETERIZATION WITH CORONARY ANGIOGRAM N/A 06/05/2014   Procedure: LEFT AND RIGHT HEART CATHETERIZATION WITH CORONARY ANGIOGRAM;  Surgeon: Sinclair Grooms, MD;  Location: [facility] C[facility];  Service: Cardiovascular;  Laterality: N/A;  . NASAL FRACTURE SURGERY    . SHOULDER ARTHROSCOPY WITH ROTATOR CUFF REPAIR AND SUBACROMIAL DECOMPRESSION Right 08/16/2013   Procedure: SHOULDER ARTHROSCOPY WITH ROTATOR CUFF REPAIR AND SUBACROMIAL DECOMPRESSION;  Surgeon: [name], MD;  Location: [facility];  Service: Orthopedics;  Laterality: Right;  Right shoulder arthroscopy rotator cuff repair, subacromial decompression.  . TONSILLECTOMY     Past Medical History:  Diagnosis Date  . Arthritis    R shoulder, bone spur  . Arthritis    "hands" (03/25/2016)  . Chronic lower back pain   . Coronary heart disease    Dr Pernell Dupre  . H/O cardiovascular stress test    perhaps last one was 2009  . Heart murmur    12/29/11 echo: mild MR, no AS, trivial TR  . History of gout   . Hyperlipidemia   . Hypertension    saw Dr. Linard Millers last earl;y- 2014, cardiac cath. last done ?2009, blocks seen didn't require any intervention at that point.  . Narcolepsy    [name] 04-11-97; Mean Latency 1.40min, SOREM 2  . OSA on CPAP    NPSG 12-13-98 AHI 22.7  . PONV (postoperative nausea and vomiting)   . Presence of permanent cardiac pacemaker   . Shortness of breath    with exertion   . Stroke ([place])   . Type II diabetes mellitus (HCC)    BP 107/68   Pulse 70   Temp 98.9 F (37.2 C)   Ht 5\' 8"  (1.727 m)   Wt 227 lb 6.4 oz (103.1 kg)   SpO2 92%   BMI 34.58 kg/m   Opioid Risk Score:   Fall Risk Score:  `1  Depression screen PHQ 2/9  Depression screen PHQ 2/9 05/09/2020  Decreased Interest 0  Down, Depressed, Hopeless 0  PHQ - 2 Score 0    Review of Systems  Musculoskeletal: Positive for gait problem.  All other systems reviewed and are negative.       Objective:   Physical Exam Vitals and nursing note reviewed.  Constitutional:      Appearance: Normal appearance.  Cardiovascular:     Rate and Rhythm: Normal rate and regular rhythm.     Pulses: Normal pulses.     Heart sounds: Normal heart sounds.  Pulmonary:     Effort: Pulmonary effort is normal.     Breath sounds: Normal breath sounds.  Musculoskeletal:     Cervical back: Normal range of motion and neck supple.     Comments: Normal Muscle Bulk and Muscle  Testing Reveals:  Upper Extremities:Full  ROM and Muscle Strength 5/5  Lower Extremities: Full ROM and Muscle Strength 5/5 Arises from Table with ease using walker for support Narrow Based Gait  Skin:    General: Skin is warm and dry.  Neurological:     Mental Status: He is alert and oriented to person, place, and time.  Psychiatric:        Mood and Affect: Mood normal.        Behavior: Behavior normal.           Assessment & Plan:  1.Thalamic Stroke: Continue Home Health Therapy with [facility]. He has an appointment with Neurology.  2.Essential Hypertension: Continue current medication regimen. PCP Following. Continue to Monitor.  3.  Controlled Type 2 DM with hyperglycemia, without long-term current use of insulin. Continue current Medication Regimen. PCP Following. Continue to Monitor.   F/U in 4- 6 weeks with Dr Posey Pronto

## 2020-05-14 DIAGNOSIS — E119 Type 2 diabetes mellitus without complications: Secondary | ICD-10-CM | POA: Diagnosis not present

## 2020-05-14 DIAGNOSIS — I495 Sick sinus syndrome: Secondary | ICD-10-CM | POA: Diagnosis not present

## 2020-05-14 DIAGNOSIS — I69351 Hemiplegia and hemiparesis following cerebral infarction affecting right dominant side: Secondary | ICD-10-CM | POA: Diagnosis not present

## 2020-05-14 DIAGNOSIS — M19011 Primary osteoarthritis, right shoulder: Secondary | ICD-10-CM | POA: Diagnosis not present

## 2020-05-14 DIAGNOSIS — D509 Iron deficiency anemia, unspecified: Secondary | ICD-10-CM | POA: Diagnosis not present

## 2020-05-14 DIAGNOSIS — M170 Bilateral primary osteoarthritis of knee: Secondary | ICD-10-CM | POA: Diagnosis not present

## 2020-05-14 DIAGNOSIS — I2510 Atherosclerotic heart disease of native coronary artery without angina pectoris: Secondary | ICD-10-CM | POA: Diagnosis not present

## 2020-05-14 DIAGNOSIS — I081 Rheumatic disorders of both mitral and tricuspid valves: Secondary | ICD-10-CM | POA: Diagnosis not present

## 2020-05-14 DIAGNOSIS — I10 Essential (primary) hypertension: Secondary | ICD-10-CM | POA: Diagnosis not present

## 2020-05-15 ENCOUNTER — Ambulatory Visit: Payer: Medicare HMO | Admitting: Adult Health

## 2020-05-15 ENCOUNTER — Encounter: Payer: Self-pay | Admitting: Adult Health

## 2020-05-15 VITALS — BP 118/84 | HR 51 | Ht 68.0 in | Wt 227.8 lb

## 2020-05-15 DIAGNOSIS — G4733 Obstructive sleep apnea (adult) (pediatric): Secondary | ICD-10-CM | POA: Diagnosis not present

## 2020-05-15 DIAGNOSIS — I639 Cerebral infarction, unspecified: Secondary | ICD-10-CM | POA: Diagnosis not present

## 2020-05-15 DIAGNOSIS — E785 Hyperlipidemia, unspecified: Secondary | ICD-10-CM

## 2020-05-15 DIAGNOSIS — I10 Essential (primary) hypertension: Secondary | ICD-10-CM | POA: Diagnosis not present

## 2020-05-15 DIAGNOSIS — E1159 Type 2 diabetes mellitus with other circulatory complications: Secondary | ICD-10-CM | POA: Diagnosis not present

## 2020-05-15 DIAGNOSIS — I6381 Other cerebral infarction due to occlusion or stenosis of small artery: Secondary | ICD-10-CM

## 2020-05-15 NOTE — Progress Notes (Signed)
[facility] [address] [place]. [place] [address] [phone]       HOSPITAL FOLLOW UP NOTE  [name] Date of Birth:  1941-07-14 Medical Record Number:  [id number]   Reason for Referral:  hospital stroke follow up    SUBJECTIVE:   CHIEF COMPLAINT:  Chief Complaint  Patient presents with  . Hospitalization Follow-up    Hospital F/U after stroke. Was discharged on 6/29. States he has been doing well since being home.   . treatment room    with wife    HPI:   [name] is a 79 y.o. male with history of diabetes, stroke, hypertension, hyperlipidemia, permanent cardiac pacemaker  who presented on 04/14/2020 with R sided decreased sensation.  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  Previously on clopidogrel and recommended DAPT for 3 weeks then clopidogrel alone.  Presented in hypertensive urgency with BP 204/116 and recommended long-term BP goal normotensive range.  LDL 57 and recommended continuation of atorvastatin 40 mg daily.  Uncontrolled DM with A1c 7.9.  Other stroke risk factors include prior stroke 01/2018 with multifocal punctate infarcts concerning for embolic pattern, former tobacco use, EtOH use, obesity, CAD, OSA on CPAP and permanent cardiac pacemaker.  Evaluated by therapies who recommended CIR for residual deficits of imbalance and safety affecting ADLs and mobility.  Discharged to CIR on 04/18/2020.  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 Small vessel disease. Sinus dz. ASPECTS 10.     CTA head & neck no LVO. Aortic atherosclerosis.   MRI  L thalamic infarct. Small vessel disease. Atrophy.   2D Echo EF 65-70%. No source of embolus. LA mildly dilated.   Pacemaker interrogation no afib  LDL 57 -continued atorvastatin 40 mg daily  HgbA1c 7.9  Lovenox 40 mg sq daily for VTE prophylaxis  clopidogrel  75 mg daily prior to admission, now on aspirin 81 mg daily and clopidogrel 75 mg daily. Continue DAPT x 3 weeks then plavix alone    Therapy recommendations:  CIR  Disposition:  CIR  Today, 05/15/2020, [name] is being seen for hospital follow-up accompanied by his wife.  He was discharged home from CIR on 04/29/2020 after 11 day stay.  Residual deficits of mild imbalance and mildly decreased sensation R finger tips and decreased right hand fine motor skills. Does report ongoing improvement currently working with home health PT. he has had gait impairment ongoing and right hand decreased fine motor control with tremors and was evaluated by Dr. Rexene Alberts on 12/11/2019 for possible Parkinson's but was not felt this was Parkinson's related.  He does continue to use cane for ambulation and denies any recent falls.  He is questioning return to driving as he was advised no driving at discharge until further cleared.  Completed 3 weeks DAPT and continues on Plavix alone without bleeding or bruising.  Continues on atorvastatin 40 mg daily without myalgias.  Blood pressure today 118/84.  Glucose levels stable.  Endorses ongoing use of CPAP for OSA management.  No further concerns at this time.       ROS:   14 system review of systems performed and negative with exception of gait impairment, imbalance, numbness/tingling and right hand weakness  PMH:  Past Medical History:  Diagnosis Date  . Arthritis    R shoulder, bone spur  . Arthritis    "hands" (03/25/2016)  .  Chronic lower back pain   . Coronary heart disease    Dr Pernell Dupre  . H/O cardiovascular stress test    perhaps last one was 2009  . Heart murmur    12/29/11 echo: mild MR, no AS, trivial TR  . History of gout   . Hyperlipidemia   . Hypertension    saw Dr. Linard Millers last earl;y- 2014, cardiac cath. last done ?2009, blocks seen didn't require any intervention at that point.  . 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  OSA on CPAP    NPSG 12-13-98 AHI 22.7  . PONV (postoperative nausea and vomiting)   . Presence of permanent cardiac pacemaker   . Shortness of breath    with exertion   . Stroke ([place])   . Type II diabetes mellitus (HCC)     PSH:  Past Surgical History:  Procedure Laterality Date  . CARDIAC CATHETERIZATION  2009?  . EP IMPLANTABLE DEVICE N/A 03/25/2016   Procedure: Pacemaker Implant - Dual Chamber;  Surgeon: Evans Lance, MD;  Location: [facility];  Service: Cardiovascular;  Laterality: N/A;  . FRACTURE SURGERY    . INSERT / REPLACE / REMOVE PACEMAKER  03/24/2016  . LEFT AND RIGHT HEART CATHETERIZATION WITH CORONARY ANGIOGRAM N/A 06/05/2014   Procedure: LEFT AND RIGHT HEART CATHETERIZATION WITH CORONARY ANGIOGRAM;  Surgeon: Sinclair Grooms, MD;  Location: [facility];  Service: Cardiovascular;  Laterality: N/A;  . NASAL FRACTURE SURGERY    . SHOULDER ARTHROSCOPY WITH ROTATOR CUFF REPAIR AND SUBACROMIAL DECOMPRESSION Right 08/16/2013   Procedure: SHOULDER ARTHROSCOPY WITH ROTATOR CUFF REPAIR AND SUBACROMIAL DECOMPRESSION;  Surgeon: [name], MD;  Location: [facility];  Service: Orthopedics;  Laterality: Right;  Right shoulder arthroscopy rotator cuff repair, subacromial decompression.  . TONSILLECTOMY      Social History:  Social History   Socioeconomic History  . Marital status: Married    Spouse name: Not on file  . Number of children: Not on file  . Years of education: Not on file  . Highest education level: Not on file  Occupational History  . Occupation: Chiropodist  . Occupation: [place] [employer]  Tobacco Use  . Smoking status: Former Smoker    Packs/day: 2.50    Years: 6.00    Pack years: 15.00    Types: Cigarettes    Quit date: 06/05/1967    Years since quitting: 52.9  . Smokeless tobacco: Never Used  Substance and Sexual Activity  . Alcohol use: Yes    Comment: rare  . Drug use: No  . Sexual activity: Not Currently  Other Topics  Concern  . Not on file  Social History Narrative  . Not on file   Social Determinants of Health   Financial Resource Strain:   . Difficulty of Paying Living Expenses:   Food Insecurity:   . Worried About Charity fundraiser in the Last Year:   . Arboriculturist in the Last Year:   Transportation Needs:   . Film/video editor (Medical):   [name] Lack of Transportation (Non-Medical):   Physical Activity:   . Days of Exercise per Week:   . Minutes of Exercise per Session:   Stress:   . Feeling of Stress :   Social Connections:   . Frequency of Communication with Friends and Family:   . Frequency of Social Gatherings with Friends and Family:   . Attends Religious Services:   .  Active Member of Clubs or Organizations:   . Attends Archivist Meetings:   [name] Marital Status:   Intimate Partner Violence:   . Fear of Current or Ex-Partner:   . Emotionally Abused:   [name] Physically Abused:   . Sexually Abused:     Family History:  Family History  Problem Relation Age of Onset  . Sleep apnea Sister   . Colon cancer Sister     Medications:   Current Outpatient Medications on File Prior to Visit  Medication Sig Dispense Refill  . amphetamine-dextroamphetamine (ADDERALL) 10 MG tablet 1-3 tabs daily as needed (Patient taking differently: Take 10 mg by mouth See admin instructions. 1-3 tabs daily as needed) 90 tablet 0  . atorvastatin (LIPITOR) 40 MG tablet Take 0.5 tablets (20 mg total) by mouth daily before breakfast. 30 tablet 0  . clopidogrel (PLAVIX) 75 MG tablet Take 1 tablet (75 mg total) by mouth daily. 30 tablet 1  . metFORMIN (GLUCOPHAGE-XR) 500 MG 24 hr tablet Take 4 tablets (2,000 mg total) by mouth daily with breakfast. 120 tablet 0  . Multiple Vitamin (MULTIVITAMIN WITH MINERALS) TABS tablet Take 1 tablet by mouth daily.    [name] NITROSTAT 0.4 MG SL tablet PLACE 1 TABLET (0.4 MG TOTAL) UNDER THE TONGUE EVERY 5 (FIVE) MINUTES AS NEEDED FOR CHEST PAIN. (Patient taking  differently: Place 0.4 mg under the tongue every 5 (five) minutes as needed for chest pain. ) 25 tablet 5  . terbinafine (LAMISIL) 1 % cream Apply topically 2 (two) times daily. 30 g 1   No current facility-administered medications on file prior to visit.    Allergies:  No Known Allergies    OBJECTIVE:  Physical Exam  Vitals:   05/15/20 0906  BP: 118/84  Pulse: (!) 51  Weight: 227 lb 12.8 oz (103.3 kg)  Height: 5\' 8"  (1.727 m)   Body mass index is 34.64 kg/m. No exam data present  General: well developed, well nourished,  pleasant elderly Caucasian male, seated, in no evident distress Head: head normocephalic and atraumatic.   Neck: supple with no carotid or supraclavicular bruits Cardiovascular: regular rate and rhythm, no murmurs Musculoskeletal: no deformity Skin:  no rash/petichiae Vascular:  Normal pulses all extremities   Neurologic Exam Mental Status: Awake and fully alert.   Fluent speech and language.  Oriented to place and time. Recent and remote memory intact. Attention span, concentration and fund of knowledge appropriate. Mood and affect appropriate.  Cranial Nerves: Fundoscopic exam reveals sharp disc margins. Pupils equal, briskly reactive to light. Extraocular movements full without nystagmus. Visual fields full to confrontation. Hearing intact. Facial sensation intact. Face, tongue, palate moves normally and symmetrically.  Motor: Normal bulk and tone. Normal strength in all tested extremity muscles. Sensory.: intact to touch , pinprick , position and vibratory sensation.  Coordination: Rapid alternating movements normal in all extremities except slightly decreased right hand. Finger-to-nose and heel-to-shin performed accurately bilaterally.  Slight decreased right hand fine motor control.  Mild postural tremor RUE without significant action tremor or resting tremor. Gait and Station: Arises from chair with mild difficulty. Stance is slightly hunched. Gait  demonstrates  broad-based gait with normal stride length and mild imbalance with use of cane Reflexes: 1+ and symmetric. Toes downgoing.     NIHSS  0 Modified Rankin  2      ASSESSMENT: Jordan [name] is a 79 y.o. year old male presented with right-sided decreased sensation on 04/14/2020 with stroke work-up revealing left thalamic  infarct secondary to small vessel disease. Vascular risk factors include HTN, HLD, DM, prior stroke 01/2018, EtOH use, obesity, CAD, OSA on CPAP and pacer.      PLAN:  1. Left thalamic stroke:  -Residual deficits: Imbalance and decreased right hand sensation and fine motor control.  Recommend continued participation in home health PT and to consider additional outpatient therapy once completed. -Released to return to driving as he has recovered well with minimal residual deficits that would not impair driving ability or safety concerns.  Recommended gradual return to driving with further instructions provided in AVS -Continue clopidogrel 75 mg daily  and atorvastatin 40 mg daily for secondary stroke prevention. -Continue to follow with PCP for aggressive risk factor management including HTN, HLD and DM  2. HTN: BP goal<130/90.  Stable today.  Continue to follow with PCP for monitoring management 3. HLD: LDL goal<70.  Recent A1c 57.  Continue atorvastatin and ongoing follow-up with PCP for prescribing, monitoring and management 4. DMII: A1c goal<7.0.  Recent A1c 7.9.  Continue to follow with PCP for monitoring and management 5. OSA on CPAP: Encouraged ongoing compliance and follow-up with pulmonology    Follow up in 4 months or call earlier if needed   I spent 45 minutes of face-to-face and non-face-to-face time with patient and wife.  This included previsit chart review, lab review, study review, order entry, electronic health record documentation, patient education regarding recent stroke, residual deficits, importance of managing stroke risk factors and  answered all questions to patient satisfaction     Frann Rider, [facility]  [facility] [address] [place], [place] [address]  Phone [phone] Fax [phone] Note: This document was prepared with digital dictation and possible smart phrase technology. Any transcriptional errors that result from this process are unintentional.

## 2020-05-15 NOTE — Patient Instructions (Addendum)
Continue therapy at home and if interested in additional outpatient therapy, please call for additional orders  Continue clopidogrel 75 mg daily  and atorvastatin 40 mg daily for secondary stroke prevention  Continue to follow up with PCP regarding cholesterol, blood pressure and diabetes management   Return to driving on graduated level: Graduated return to driving as recommended.  It is recommended that you first drive with another licensed driver in an empty parking lot. If you do well with this, you can drive on a quiet street with the licensed driver.  If you do well with this, you can drive on a busy street with a licensed driver.  If you continue to do well, you can be cleared to drive independently.  For the first month after resuming driving, it is recommend no nighttime, busy/heavy traffic roads or Interstate driving.   Continue to monitor blood pressure at home  Maintain strict control of hypertension with blood pressure goal below 130/90, diabetes with hemoglobin A1c goal below 6.5% and cholesterol with LDL cholesterol (bad cholesterol) goal below 70 mg/dL. I also advised the patient to eat a healthy diet with plenty of whole grains, cereals, fruits and vegetables, exercise regularly and maintain ideal body weight.  Followup in the future with me in 4 months or call earlier if needed       Thank you for coming to see [place] at [facility]. I hope we have been able to provide you high quality care today.  You may receive a patient satisfaction survey over the next few weeks. We would appreciate your feedback and comments so that we may continue to improve ourselves and the health of our patients.

## 2020-05-15 NOTE — Progress Notes (Signed)
I agree with the above plan 

## 2020-05-16 DIAGNOSIS — E119 Type 2 diabetes mellitus without complications: Secondary | ICD-10-CM | POA: Diagnosis not present

## 2020-05-16 DIAGNOSIS — M170 Bilateral primary osteoarthritis of knee: Secondary | ICD-10-CM | POA: Diagnosis not present

## 2020-05-16 DIAGNOSIS — I10 Essential (primary) hypertension: Secondary | ICD-10-CM | POA: Diagnosis not present

## 2020-05-16 DIAGNOSIS — I69351 Hemiplegia and hemiparesis following cerebral infarction affecting right dominant side: Secondary | ICD-10-CM | POA: Diagnosis not present

## 2020-05-16 DIAGNOSIS — M19011 Primary osteoarthritis, right shoulder: Secondary | ICD-10-CM | POA: Diagnosis not present

## 2020-05-16 DIAGNOSIS — D509 Iron deficiency anemia, unspecified: Secondary | ICD-10-CM | POA: Diagnosis not present

## 2020-05-16 DIAGNOSIS — I495 Sick sinus syndrome: Secondary | ICD-10-CM | POA: Diagnosis not present

## 2020-05-16 DIAGNOSIS — I081 Rheumatic disorders of both mitral and tricuspid valves: Secondary | ICD-10-CM | POA: Diagnosis not present

## 2020-05-16 DIAGNOSIS — I2510 Atherosclerotic heart disease of native coronary artery without angina pectoris: Secondary | ICD-10-CM | POA: Diagnosis not present

## 2020-05-26 DIAGNOSIS — E119 Type 2 diabetes mellitus without complications: Secondary | ICD-10-CM | POA: Diagnosis not present

## 2020-05-26 DIAGNOSIS — I10 Essential (primary) hypertension: Secondary | ICD-10-CM | POA: Diagnosis not present

## 2020-05-26 DIAGNOSIS — I495 Sick sinus syndrome: Secondary | ICD-10-CM | POA: Diagnosis not present

## 2020-05-26 DIAGNOSIS — M170 Bilateral primary osteoarthritis of knee: Secondary | ICD-10-CM | POA: Diagnosis not present

## 2020-05-26 DIAGNOSIS — I081 Rheumatic disorders of both mitral and tricuspid valves: Secondary | ICD-10-CM | POA: Diagnosis not present

## 2020-05-26 DIAGNOSIS — I2510 Atherosclerotic heart disease of native coronary artery without angina pectoris: Secondary | ICD-10-CM | POA: Diagnosis not present

## 2020-05-26 DIAGNOSIS — D509 Iron deficiency anemia, unspecified: Secondary | ICD-10-CM | POA: Diagnosis not present

## 2020-05-26 DIAGNOSIS — I69351 Hemiplegia and hemiparesis following cerebral infarction affecting right dominant side: Secondary | ICD-10-CM | POA: Diagnosis not present

## 2020-05-26 DIAGNOSIS — M19011 Primary osteoarthritis, right shoulder: Secondary | ICD-10-CM | POA: Diagnosis not present

## 2020-06-01 ENCOUNTER — Other Ambulatory Visit: Payer: Self-pay | Admitting: Physical Medicine and Rehabilitation

## 2020-06-05 DIAGNOSIS — M9905 Segmental and somatic dysfunction of pelvic region: Secondary | ICD-10-CM | POA: Diagnosis not present

## 2020-06-05 DIAGNOSIS — M9904 Segmental and somatic dysfunction of sacral region: Secondary | ICD-10-CM | POA: Diagnosis not present

## 2020-06-05 DIAGNOSIS — M9903 Segmental and somatic dysfunction of lumbar region: Secondary | ICD-10-CM | POA: Diagnosis not present

## 2020-06-05 DIAGNOSIS — M5136 Other intervertebral disc degeneration, lumbar region: Secondary | ICD-10-CM | POA: Diagnosis not present

## 2020-06-12 ENCOUNTER — Ambulatory Visit: Payer: Medicare HMO | Admitting: Internal Medicine

## 2020-06-17 ENCOUNTER — Encounter: Payer: Medicare HMO | Attending: Registered Nurse | Admitting: Physical Medicine & Rehabilitation

## 2020-06-17 DIAGNOSIS — I639 Cerebral infarction, unspecified: Secondary | ICD-10-CM | POA: Insufficient documentation

## 2020-06-17 DIAGNOSIS — E1165 Type 2 diabetes mellitus with hyperglycemia: Secondary | ICD-10-CM | POA: Insufficient documentation

## 2020-06-17 DIAGNOSIS — I10 Essential (primary) hypertension: Secondary | ICD-10-CM | POA: Insufficient documentation

## 2020-06-18 DIAGNOSIS — M545 Low back pain: Secondary | ICD-10-CM | POA: Diagnosis not present

## 2020-06-18 DIAGNOSIS — G8929 Other chronic pain: Secondary | ICD-10-CM | POA: Diagnosis not present

## 2020-06-21 ENCOUNTER — Emergency Department (HOSPITAL_COMMUNITY): Payer: Medicare HMO

## 2020-06-21 ENCOUNTER — Other Ambulatory Visit: Payer: Self-pay

## 2020-06-21 ENCOUNTER — Emergency Department (HOSPITAL_COMMUNITY)
Admission: EM | Admit: 2020-06-21 | Discharge: 2020-06-21 | Disposition: A | Discharge status: Home / Self Care | Payer: Medicare HMO | Attending: Emergency Medicine | Admitting: Emergency Medicine

## 2020-06-21 DIAGNOSIS — S022XXA Fracture of nasal bones, initial encounter for closed fracture: Secondary | ICD-10-CM | POA: Diagnosis not present

## 2020-06-21 DIAGNOSIS — R509 Fever, unspecified: Secondary | ICD-10-CM | POA: Diagnosis not present

## 2020-06-21 DIAGNOSIS — W19XXXA Unspecified fall, initial encounter: Secondary | ICD-10-CM | POA: Diagnosis not present

## 2020-06-21 DIAGNOSIS — Y9389 Activity, other specified: Secondary | ICD-10-CM | POA: Diagnosis not present

## 2020-06-21 DIAGNOSIS — Z20822 Contact with and (suspected) exposure to covid-19: Secondary | ICD-10-CM | POA: Insufficient documentation

## 2020-06-21 DIAGNOSIS — I10 Essential (primary) hypertension: Secondary | ICD-10-CM | POA: Diagnosis not present

## 2020-06-21 DIAGNOSIS — Y929 Unspecified place or not applicable: Secondary | ICD-10-CM | POA: Insufficient documentation

## 2020-06-21 DIAGNOSIS — E1165 Type 2 diabetes mellitus with hyperglycemia: Secondary | ICD-10-CM | POA: Diagnosis not present

## 2020-06-21 DIAGNOSIS — S299XXA Unspecified injury of thorax, initial encounter: Secondary | ICD-10-CM | POA: Diagnosis not present

## 2020-06-21 DIAGNOSIS — Y999 Unspecified external cause status: Secondary | ICD-10-CM | POA: Diagnosis not present

## 2020-06-21 DIAGNOSIS — Z23 Encounter for immunization: Secondary | ICD-10-CM | POA: Insufficient documentation

## 2020-06-21 DIAGNOSIS — J0140 Acute pansinusitis, unspecified: Secondary | ICD-10-CM | POA: Insufficient documentation

## 2020-06-21 DIAGNOSIS — G459 Transient cerebral ischemic attack, unspecified: Secondary | ICD-10-CM | POA: Diagnosis not present

## 2020-06-21 DIAGNOSIS — S0990XA Unspecified injury of head, initial encounter: Secondary | ICD-10-CM | POA: Diagnosis not present

## 2020-06-21 DIAGNOSIS — S0992XA Unspecified injury of nose, initial encounter: Secondary | ICD-10-CM | POA: Diagnosis present

## 2020-06-21 LAB — SARS CORONAVIRUS 2 BY RT PCR (HOSPITAL ORDER, PERFORMED IN ~~LOC~~ HOSPITAL LAB): SARS Coronavirus 2: NEGATIVE

## 2020-06-21 MED ORDER — ACETAMINOPHEN 500 MG PO TABS
1000.0000 mg | ORAL_TABLET | Freq: Once | ORAL | Status: AC
  Administered 2020-06-21: 1000 mg via ORAL
  Filled 2020-06-21: qty 2

## 2020-06-21 MED ORDER — AMOXICILLIN 500 MG PO CAPS: 500.0000 mg | ORAL_CAPSULE | Freq: Three times a day (TID) | ORAL | 0 refills | Status: DC

## 2020-06-21 MED ORDER — TETANUS-DIPHTH-ACELL PERTUSSIS 5-2.5-18.5 LF-MCG/0.5 IM SUSP
0.5000 mL | Freq: Once | INTRAMUSCULAR | Status: AC
  Administered 2020-06-21: 0.5 mL via INTRAMUSCULAR
  Filled 2020-06-21: qty 0.5

## 2020-06-21 NOTE — ED Triage Notes (Signed)
Pt arrived via EMS w/ c/o fall on thinners. Per EMS pt had TIA in June and has had balance problems since. Pt was walking in the parking lot to his car and tripped and fell. Pt reports he tried to break his fall w/ L arm. Impact to face and L arm. Pt is on plavix.

## 2020-06-21 NOTE — Discharge Instructions (Signed)
Your Covid test today was negative.  Your CAT scan showed no internal bleeding in your brain but did show that you broke your nose.  No other facial fractures.  It also showed that you had a sinus infection which is most likely what is causing your low-grade fever.  You were given an antibiotic for the sinus infection but should follow-up with your doctor if symptoms do not improve.  Make sure you are using your assist device to help you walk to avoid further falls.  Make sure you get up slowly and take your time walking.  It would be a good idea to stand up first to make sure you do not feel dizzy before you start walking.  You can use Tylenol for pain.  Your x-ray today showed no sign of a rib fracture but you probably bruised the area and tomorrow the pain may be worse.  You should take 2 extra strength Tylenol every 6 hours as needed for pain.  You can also use Voltaren gel to rub in the areas that are tender.

## 2020-06-21 NOTE — ED Provider Notes (Addendum)
[place] EMERGENCY DEPARTMENT Provider Note   CSN: [id number] Arrival date & time: 06/21/20  1857     History No chief complaint on file.   Jordan [name] is a 79 y.o. male.  Patient is an 79 year old male with a history of diabetes, hypertension, hyperlipidemia, CAD and prior stroke in June with ongoing balance issues who presents today as a level 2 trauma after a fall on blood thinners.  Patient reports that he was walking up to the house when he started losing his footing and fell forward hitting the glass door and falling to the ground.  He hit his face on the door but denies loss of consciousness.  He reports he did not get up because he was afraid that he might of injured something.  He denies any pain in his legs or arms but after multiple episodes of questioning does say that he has left torso pain but denies any shortness of breath.  Patient denies any neck pain and reports his vision seems unchanged.  He does not have a deep headache but does have some mild pain on his forehead where he hit his head.  He also reported to the nurse that for the last few days he has had some mild congestion and was reported to have a fever here.  He denies any cough or shortness of breath.  He has been vaccinated against Covid with the 2 shots and states that he is due for his third shot and booster in October.  The history is provided by the patient and the EMS personnel.  Fever      No past medical history on file.  There are no problems to display for this patient.        No family history on file.  Social History   Tobacco Use  . Smoking status: Not on file  Substance Use Topics  . Alcohol use: Not on file  . Drug use: Not on file    Home Medications Prior to Admission medications   Not on File    Allergies    Patient has no allergy information on record.  Review of Systems   Review of Systems  Constitutional: Positive for fever.  All other  systems reviewed and are negative.   Physical Exam Updated Vital Signs BP 140/60 (BP Location: Right Arm)   Pulse 79   Resp 18   SpO2 96%   Physical Exam Vitals and nursing note reviewed.  Constitutional:      General: He is not in acute distress.    Appearance: He is well-developed. He is obese.  HENT:     Head: Normocephalic. Contusion present.      Nose: Congestion present.     Right Nostril: No epistaxis.     Left Nostril: No epistaxis.     Mouth/Throat:     Tongue: No lesions.     Pharynx: Oropharynx is clear.  Eyes:     Conjunctiva/sclera: Conjunctivae normal.     Pupils: Pupils are equal, round, and reactive to light.  Cardiovascular:     Rate and Rhythm: Normal rate and regular rhythm.     Pulses: Normal pulses.     Heart sounds: No murmur heard.   Pulmonary:     Effort: Pulmonary effort is normal. No respiratory distress.     Breath sounds: Normal breath sounds. No wheezing or rales.  Chest:     Chest wall: Tenderness present.    Abdominal:  General: There is no distension.     Palpations: Abdomen is soft.     Tenderness: There is no abdominal tenderness. There is no guarding or rebound.  Musculoskeletal:        General: No tenderness. Normal range of motion.     Right shoulder: Normal.     Left shoulder: Normal.     Cervical back: Normal range of motion and neck supple.     Right knee: Normal.     Left knee: Normal.     Right lower leg: No edema.     Left lower leg: No edema.  Skin:    General: Skin is warm and dry.     Capillary Refill: Capillary refill takes less than 2 seconds.     Findings: No erythema or rash.  Neurological:     General: No focal deficit present.     Mental Status: He is alert and oriented to person, place, and time. Mental status is at baseline.     Cranial Nerves: No cranial nerve deficit.     Sensory: No sensory deficit.     Motor: No weakness or pronator drift.  Psychiatric:        Mood and Affect: Mood normal.          Behavior: Behavior normal.        Thought Content: Thought content normal.     ED Results / Procedures / Treatments   Labs (all labs ordered are listed, but only abnormal results are displayed) Labs Reviewed - No data to display  EKG None  Radiology DG Ribs Unilateral W/Chest Left  Result Date: 06/21/2020 CLINICAL DATA:  Status post trauma. EXAM: LEFT RIBS AND CHEST - 3+ VIEW COMPARISON:  February 17, 2018 FINDINGS: No fracture or other bone lesions are seen involving the ribs. There is no evidence of pneumothorax or pleural effusion. Both lungs are clear. 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 Heart size and mediastinal contours are within normal limits. IMPRESSION: Negative. Electronically Signed   By: Virgina Norfolk M.D.   On: 06/21/2020 20:02   CT HEAD WO CONTRAST  Result Date: 06/21/2020 CLINICAL DATA:  Status post fall. EXAM: CT HEAD WITHOUT CONTRAST CT MAXILLOFACIAL WITHOUT CONTRAST TECHNIQUE: Multidetector CT imaging of the head and maxillofacial structures were performed using the standard protocol without intravenous contrast. Multiplanar CT image reconstructions of the maxillofacial structures were also generated. COMPARISON:  None. FINDINGS: CT HEAD FINDINGS Brain: There is moderate severity cerebral atrophy with widening of the extra-axial spaces and ventricular dilatation. There are areas of decreased attenuation within the white matter tracts of the supratentorial brain, consistent with microvascular disease changes. There is a small chronic right basal ganglia lacunar infarct. A small area of cortical encephalomalacia, with adjacent chronic white matter low attenuation, is again seen within the left frontal lobe. A small, similar appearing area is seen within the parasagittal region of the right occipital lobe. Vascular: No hyperdense vessel or unexpected calcification. Skull: Negative for acute skull fracture or focal lesion. Other: There is moderate to marked severity bilateral  ethmoid sinus and left-sided frontal sinus mucosal thickening. Marked severity left maxillary sinus mucosal thickening is also seen. CT MAXILLOFACIAL FINDINGS Osseous: A small mildly displaced anterior right nasal bone fracture is seen. Orbits: Negative. No traumatic or inflammatory finding. Sinuses: There is moderate to marked severity bilateral ethmoid sinus and left-sided frontal sinus mucosal thickening. Marked severity left maxillary sinus mucosal thickening is also seen. Soft tissues: Mild bilateral paranasal soft tissue swelling  is noted. IMPRESSION: 1. Small mildly displaced anterior right nasal bone fracture. 2. Moderate severity cerebral atrophy and microvascular disease changes of the supratentorial brain. 3. 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 4. Moderate to marked severity left maxillary sinus, bilateral ethmoid sinus and left-sided frontal sinus mucosal thickening. Electronically Signed   By: Virgina Norfolk M.D.   On: 06/21/2020 20:00   CT Maxillofacial Wo Contrast  Result Date: 06/21/2020 CLINICAL DATA:  Status post fall. EXAM: CT HEAD WITHOUT CONTRAST CT MAXILLOFACIAL WITHOUT CONTRAST TECHNIQUE: Multidetector CT imaging of the head and maxillofacial structures were performed using the standard protocol without intravenous contrast. Multiplanar CT image reconstructions of the maxillofacial structures were also generated. COMPARISON:  April 14, 2020 FINDINGS: CT HEAD FINDINGS Brain: There is moderate severity cerebral atrophy with widening of the extra-axial spaces and ventricular dilatation. There are areas of decreased attenuation within the white matter tracts of the supratentorial brain, consistent with microvascular disease changes. There is a small chronic right basal ganglia lacunar infarct. A small area of cortical encephalomalacia, with adjacent chronic white matter low attenuation, is again seen within the left frontal lobe. A small, similar appearing area is  seen within the parasagittal region of the right occipital lobe. Vascular: No hyperdense vessel or unexpected calcification. Skull: Negative for acute skull fracture or focal lesion. Other: There is moderate to marked severity bilateral ethmoid sinus and left-sided frontal sinus mucosal thickening. Marked severity left maxillary sinus mucosal thickening is also seen. CT MAXILLOFACIAL FINDINGS Osseous: A small mildly displaced anterior right nasal bone fracture is seen. Orbits: Negative. No traumatic or inflammatory finding. Sinuses: There is moderate to marked severity bilateral ethmoid sinus and left-sided frontal sinus mucosal thickening. Marked severity left maxillary sinus mucosal thickening is also seen. Soft tissues: Mild bilateral paranasal soft tissue swelling is noted. IMPRESSION: 1. Small mildly displaced anterior right nasal bone fracture. 2. Moderate severity cerebral atrophy and microvascular disease changes of the supratentorial brain. 3. 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 4. Moderate to marked severity left maxillary sinus, bilateral ethmoid sinus and left-sided frontal sinus mucosal thickening. Electronically Signed   By: Virgina Norfolk M.D.   On: 06/21/2020 19:59    Procedures Procedures (including critical care time)  Medications Ordered in ED Medications - No data to display  ED Course  I have reviewed the triage vital signs and the nursing notes.  Pertinent labs & imaging results that were available during my care of the patient were reviewed by me and considered in my medical decision making (see chart for details).    MDM Rules/Calculators/A&P                          Elderly male with mild underlying medical conditions presenting today as a level 2 fall on Plavix.  Patient reports he lost his balance and fell forward.  He does have trauma to his forehead and nose.  He is awake alert oriented and in no acute distress.  He has no cervical spine  tenderness and denies any loss of consciousness.  Patient initially did not complain of any pain but then with movement into a different bed patient is having some left-sided rib pain.  He also was noted to have a temperature 100.7 here and has recently had cough and congestion for the last few days.  He denies any shortness of breath and has been vaccinated against Covid.  Patient is otherwise hemodynamically stable  and oxygen saturation is within normal limits on room air.  Tetanus shot was updated.  CT of the head and face are pending.  Chest x-ray with rib films pending.  Covid test pending.  9:32 PM Rib films and chest x-ray are within normal limits.  Head CT is negative for intracranial injury but does show moderate to severe cerebral atrophy.  He does have a mildly displaced anterior right nasal bone fracture and moderate to marked severity of left maxillary sinus and bilateral ethmoid sinus mucosal thickening.  With patient's recent congestion and low-grade fever most likely is from a sinusitis.  Patient's Covid test is negative.  He is otherwise well-appearing and feel that he was stable for discharge.  Will cover with an antibiotic for sinusitis.  Pt did stand and ambulate here without dizziness or lightheaded ness however he was unsteady and will require and walking device which he has at home.  MDM Number of Diagnoses or Management Options   Amount and/or Complexity of Data Reviewed Clinical lab tests: ordered and reviewed Tests in the radiology section of CPT: ordered and reviewed Decide to obtain previous medical records or to obtain history from someone other than the patient: yes Review and summarize past medical records: yes Independent visualization of images, tracings, or specimens: yes  Risk of Complications, Morbidity, and/or Mortality Presenting problems: moderate Diagnostic procedures: low Management options: low  Patient Progress Patient progress: stable    Final  Clinical Impression(s) / ED Diagnoses Final diagnoses:  Closed fracture of nasal bone, initial encounter  Acute non-recurrent pansinusitis    Rx / DC Orders ED Discharge Orders    None       Blanchie Dessert, MD 06/21/20 2134    Blanchie Dessert, MD 06/21/20 2252

## 2020-06-21 NOTE — Progress Notes (Signed)
   06/21/20 1929  Clinical Encounter Type  Visited With Patient;Health care provider  Visit Type Initial;ED;Trauma   Chaplain responded to a trauma in the ED.  Chaplain met with patient, who asked me to call his wife [name] - [phone]. Chaplain made the call and left a voicemail. Spiritual care services available as needed.   Jeri Lager, Chaplain

## 2020-06-21 NOTE — Progress Notes (Signed)
Orthopedic Tech Progress Note Patient Details:  Jordan [name] 1940/12/19 [id number] Level 2 Trauma not needed. Patient ID: Jordan [name], male   DOB: Oct 28, 1941, 79 y.o.   MRN: [id number]   [name] 06/21/2020, 6:55 PM

## 2020-06-21 NOTE — ED Notes (Signed)
Reviewed D/C instructions, follow-up care, and medications. Pt verbalized understanding. Pt A&Ox4 and VSS upon departure.

## 2020-06-24 ENCOUNTER — Encounter: Payer: Self-pay | Admitting: Pulmonary Disease

## 2020-06-24 ENCOUNTER — Other Ambulatory Visit: Payer: Self-pay

## 2020-06-24 ENCOUNTER — Ambulatory Visit: Payer: Medicare HMO | Admitting: Pulmonary Disease

## 2020-06-24 VITALS — BP 142/80 | HR 95 | Temp 97.5°F | Ht 69.0 in | Wt 227.8 lb

## 2020-06-24 DIAGNOSIS — J320 Chronic maxillary sinusitis: Secondary | ICD-10-CM

## 2020-06-24 DIAGNOSIS — J0100 Acute maxillary sinusitis, unspecified: Secondary | ICD-10-CM | POA: Insufficient documentation

## 2020-06-24 DIAGNOSIS — Z9989 Dependence on other enabling machines and devices: Secondary | ICD-10-CM

## 2020-06-24 DIAGNOSIS — G47419 Narcolepsy without cataplexy: Secondary | ICD-10-CM | POA: Diagnosis not present

## 2020-06-24 DIAGNOSIS — G4733 Obstructive sleep apnea (adult) (pediatric): Secondary | ICD-10-CM | POA: Diagnosis not present

## 2020-06-24 MED ORDER — FLUTICASONE PROPIONATE 50 MCG/ACT NA SUSP: 1.0000 | Freq: Every day | NASAL | 3 refills | Status: DC

## 2020-06-24 MED ORDER — AMPHETAMINE-DEXTROAMPHETAMINE 10 MG PO TABS: ORAL_TABLET | ORAL | 0 refills | Status: DC

## 2020-06-24 NOTE — Assessment & Plan Note (Signed)
Plan: Continue CPAP therapy Follow-up with Dr. Annamaria Boots in 6 months

## 2020-06-24 NOTE — Progress Notes (Signed)
@Patient  ID: Jordan [name], male    DOB: 1941/01/19, 79 y.o.   MRN: [id number]  Chief Complaint  Patient presents with  . 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 VN Vet Smoker/ Smoking History: Former smoker Maintenance:   Pt of: Dr. Annamaria Boots  06/24/2020  - Visit   80 year old male former smoker followed in our office for obstructive sleep apnea.  Followed by Dr. Annamaria Boots.  Presenting today as a 1 year follow-up.  Maintained on CPAP therapy.  CPAP compliance report listed below:  05/14/2020-06/12/2020-CPAP compliance report-28 on the last 30 days used, 25 of those days greater than 4 hours, average usage 6 hours and 22 minutes, CPAP set pressure of 9, AHI 3.3  Patient reports that he is doing well with his CPAP.  Unfortunately patient recently had a fall last Saturday.  Presented to the emergency room and was evaluated there.  Patient also had a stroke in June/2021.  While in the emergency room status post a fall a CT head was done which showed maxillary sinusitis.  Patient is currently taking amoxicillin 3 times daily for 8 days.  He is unsure what the upcoming or further treatment of this would be.  Patient is also requesting a refill of his Adderall prescription.  He is currently taking 10 mg of Adderall daily.  He typically does not need a second or third dose.  Questionaires / Pulmonary Flowsheets:   ACT:  No flowsheet data found.  MMRC: No flowsheet data found.  Epworth:  No flowsheet data found.  Tests:   NPSG 12/13/98- AHI 22.7/hr  Office Spirometry 01/31/14-  Within normal limits-FVC 3.09/77%, FEV1 2.45/80% , FEV1/FVC 0.79, FEF 25-75% 2.45/87%.   FENO:  No results found for: NITRICOXIDE  PFT: No flowsheet data found.  WALK:  No flowsheet data found.  Imaging: DG Ribs Unilateral  W/Chest Left  Result Date: 06/21/2020 CLINICAL DATA:  Status post trauma. EXAM: LEFT RIBS AND CHEST - 3+ VIEW COMPARISON:  February 17, 2018 FINDINGS: No fracture or other bone lesions are seen involving the ribs. There is no evidence of pneumothorax or pleural effusion. Both lungs are clear. 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 Heart size and mediastinal contours are within normal limits. IMPRESSION: Negative. Electronically Signed   By: Virgina Norfolk M.D.   On: 06/21/2020 20:02   CT HEAD WO CONTRAST  Result Date: 06/21/2020 CLINICAL DATA:  Status post fall. EXAM: CT HEAD WITHOUT CONTRAST CT MAXILLOFACIAL WITHOUT CONTRAST TECHNIQUE: Multidetector CT imaging of the head and maxillofacial structures were performed using the standard protocol without intravenous contrast. Multiplanar CT image reconstructions of the maxillofacial structures were also generated. COMPARISON:  None. FINDINGS: CT HEAD FINDINGS Brain: There is moderate severity cerebral atrophy with widening of the extra-axial spaces and ventricular dilatation. There are areas of decreased attenuation within the white matter tracts of the supratentorial brain, consistent with microvascular disease changes. There is a small chronic [name] basal ganglia lacunar infarct. A small area of cortical encephalomalacia, with adjacent chronic white matter low attenuation, is again seen within the left frontal lobe. A small, similar appearing area is seen within the parasagittal region of the [name] occipital lobe. Vascular: No hyperdense vessel or unexpected calcification. Skull: Negative for acute skull fracture or focal lesion. Other: There is moderate to marked  severity bilateral ethmoid sinus and left-sided frontal sinus mucosal thickening. Marked severity left maxillary sinus mucosal thickening is also seen. CT MAXILLOFACIAL FINDINGS Osseous: A small mildly displaced anterior [name] nasal bone fracture is seen. Orbits: Negative. No traumatic or inflammatory finding.  Sinuses: There is moderate to marked severity bilateral ethmoid sinus and left-sided frontal sinus mucosal thickening. Marked severity left maxillary sinus mucosal thickening is also seen. Soft tissues: Mild bilateral paranasal soft tissue swelling is noted. IMPRESSION: 1. Small mildly displaced anterior [name] nasal bone fracture. 2. Moderate severity cerebral atrophy and microvascular disease changes of the supratentorial brain. 3. 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 4. Moderate to marked severity left maxillary sinus, bilateral ethmoid sinus and left-sided frontal sinus mucosal thickening. Electronically Signed   By: Virgina Norfolk M.D.   On: 06/21/2020 20:00   CT Maxillofacial Wo Contrast  Result Date: 06/21/2020 CLINICAL DATA:  Status post fall. EXAM: CT HEAD WITHOUT CONTRAST CT MAXILLOFACIAL WITHOUT CONTRAST TECHNIQUE: Multidetector CT imaging of the head and maxillofacial structures were performed using the standard protocol without intravenous contrast. Multiplanar CT image reconstructions of the maxillofacial structures were also generated. COMPARISON:  April 14, 2020 FINDINGS: CT HEAD FINDINGS Brain: There is moderate severity cerebral atrophy with widening of the extra-axial spaces and ventricular dilatation. There are areas of decreased attenuation within the white matter tracts of the supratentorial brain, consistent with microvascular disease changes. There is a small chronic [name] basal ganglia lacunar infarct. A small area of cortical encephalomalacia, with adjacent chronic white matter low attenuation, is again seen within the left frontal lobe. A small, similar appearing area is seen within the parasagittal region of the [name] occipital lobe. Vascular: No hyperdense vessel or unexpected calcification. Skull: Negative for acute skull fracture or focal lesion. Other: There is moderate to marked severity bilateral ethmoid sinus and left-sided frontal sinus mucosal  thickening. Marked severity left maxillary sinus mucosal thickening is also seen. CT MAXILLOFACIAL FINDINGS Osseous: A small mildly displaced anterior [name] nasal bone fracture is seen. Orbits: Negative. No traumatic or inflammatory finding. Sinuses: There is moderate to marked severity bilateral ethmoid sinus and left-sided frontal sinus mucosal thickening. Marked severity left maxillary sinus mucosal thickening is also seen. Soft tissues: Mild bilateral paranasal soft tissue swelling is noted. IMPRESSION: 1. Small mildly displaced anterior [name] nasal bone fracture. 2. Moderate severity cerebral atrophy and microvascular disease changes of the supratentorial brain. 3. 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 4. Moderate to marked severity left maxillary sinus, bilateral ethmoid sinus and left-sided frontal sinus mucosal thickening. Electronically Signed   By: Virgina Norfolk M.D.   On: 06/21/2020 19:59    Lab Results:  CBC    Component Value Date/Time   WBC 9.1 04/28/2020 0538   RBC 4.72 04/28/2020 0538   HGB 13.7 04/28/2020 0538   HCT 41.9 04/28/2020 0538   PLT 238 04/28/2020 0538   MCV 88.8 04/28/2020 0538   MCH 29.0 04/28/2020 0538   MCHC 32.7 04/28/2020 0538   RDW 13.2 04/28/2020 0538   LYMPHSABS 2.4 04/19/2020 0525   MONOABS 1.0 04/19/2020 0525   EOSABS 0.7 (H) 04/19/2020 0525   BASOSABS 0.1 04/19/2020 0525    BMET    Component Value Date/Time   NA 139 04/28/2020 0538   K 4.1 04/28/2020 0538   CL 107 04/28/2020 0538   CO2 22 04/28/2020 0538   GLUCOSE 135 (H) 04/28/2020 0538   BUN 21 04/28/2020 0538   CREATININE 1.13 04/28/2020 1027  CREATININE 1.16 03/18/2016 1011   CALCIUM 9.2 04/28/2020 0538   GFRNONAA >60 04/28/2020 0538   GFRAA >60 04/28/2020 0538    BNP No results found for: BNP  ProBNP    Component Value Date/Time   PROBNP 19.0 03/22/2014 0951    Specialty Problems      Pulmonary Problems   OSA on CPAP    NPSG 12/13/98- AHI  22.7/hr CPAP 9/ [facility]       Dyspnea on exertion    Office Spirometry 01/31/14- Within normal limits-FVC 3.09/77%, FEV1 2.45/80% , FEV1/FEC 0.79, FEF 25-75% 2.45/87%       Cough   Sinusitis, acute maxillary      Not on File  Immunization History  Administered Date(s) Administered  . Influenza Split 10/02/2011, 08/01/2012, 08/01/2013, 08/02/2015  . Influenza Whole 01/30/2009  . Influenza,inj,Quad PF,6+ Mos 10/01/2014  . Influenza-Unspecified 07/13/2018  . Moderna SARS-COVID-2 Vaccination 11/14/2019, 12/12/2019  . Pneumococcal Polysaccharide-23 11/02/2007  . Tdap 06/21/2020    Past Medical History:  Diagnosis Date  . Arthritis    R shoulder, bone spur  . Arthritis    "hands" (03/25/2016)  . Chronic lower back pain   . Coronary heart disease    Dr Pernell Dupre  . H/O cardiovascular stress test    perhaps last one was 2009  . Heart murmur    12/29/11 echo: mild MR, no AS, trivial TR  . History of gout   . Hyperlipidemia   . Hypertension    saw Dr. Linard Millers last earl;y- 2014, cardiac cath. last done ?2009, blocks seen didn't require any intervention at that point.  . Narcolepsy    [name] 04-11-97; Mean Latency 1.67min, SOREM 2  . OSA on CPAP    NPSG 12-13-98 AHI 22.7  . PONV (postoperative nausea and vomiting)   . Presence of permanent cardiac pacemaker   . Shortness of breath    with exertion   . Stroke ([place])   . Type II diabetes mellitus (HCC)     Tobacco History: Social History   Tobacco Use  Smoking Status Former Smoker  . Packs/day: 2.50  . Years: 6.00  . Pack years: 15.00  . Types: Cigarettes  . Quit date: 06/05/1967  . Years since quitting: 53.0  Smokeless Tobacco Never Used   Counseling given: Not Answered   Continue to not smoke  Outpatient Encounter Medications as of 06/24/2020  Medication Sig  . amoxicillin (AMOXIL) 500 MG capsule Take 1 capsule (500 mg total) by mouth 3 (three) times daily.  [name] amphetamine-dextroamphetamine (ADDERALL) 10 MG tablet  1-3 tabs daily as needed  . amphetamine-dextroamphetamine (ADDERALL) 10 MG tablet Take 10 mg by mouth daily with breakfast.  . atorvastatin (LIPITOR) 40 MG tablet Take 0.5 tablets (20 mg total) by mouth daily before breakfast.  . atorvastatin (LIPITOR) 40 MG tablet Take 40 mg by mouth daily.  . clopidogrel (PLAVIX) 75 MG tablet Take 1 tablet (75 mg total) by mouth daily.  . clopidogrel (PLAVIX) 75 MG tablet Take 75 mg by mouth daily.  . Coenzyme Q10 (CO Q-10 PO) Take 1 capsule by mouth in the morning and at bedtime.  [name] lisinopril (ZESTRIL) 10 MG tablet Take 10 mg by mouth daily.  . metFORMIN (GLUCOPHAGE-XR) 500 MG 24 hr tablet Take 4 tablets (2,000 mg total) by mouth daily with breakfast.  . metFORMIN (GLUCOPHAGE-XR) 500 MG 24 hr tablet Take 1,000 mg by mouth 2 (two) times daily.  . Multiple Vitamin (MULTIVITAMIN WITH MINERALS) TABS tablet Take 1  tablet by mouth daily.  [name] NITROSTAT 0.4 MG SL tablet PLACE 1 TABLET (0.4 MG TOTAL) UNDER THE TONGUE EVERY 5 (FIVE) MINUTES AS NEEDED FOR CHEST PAIN. (Patient taking differently: Place 0.4 mg under the tongue every 5 (five) minutes as needed for chest pain. )  . Omega-3 Fatty Acids (OMEGA-3 PO) Take 1 capsule by mouth 5 (five) times daily.  [name] PRESCRIPTION MEDICATION See admin instructions. CPAP- At bedtime  . terbinafine (LAMISIL) 1 % cream Apply topically 2 (two) times daily.  . [DISCONTINUED] amphetamine-dextroamphetamine (ADDERALL) 10 MG tablet 1-3 tabs daily as needed (Patient taking differently: Take 10 mg by mouth See admin instructions. 1-3 tabs daily as needed)  . fluticasone (FLONASE) 50 MCG/ACT nasal spray Place 1 spray into both nostrils daily.   No facility-administered encounter medications on file as of 06/24/2020.     Review of Systems  Review of Systems  Constitutional: Negative for activity change, chills, fatigue, fever and unexpected weight change.  HENT: Positive for congestion. Negative for postnasal drip, rhinorrhea, sinus  pressure, sinus pain and sore throat.   Eyes: Negative.   Respiratory: Negative for cough, shortness of breath and wheezing.   Cardiovascular: Negative for chest pain and palpitations.  Gastrointestinal: Negative for constipation, diarrhea, nausea and vomiting.  Endocrine: Negative.   Genitourinary: Negative.   Musculoskeletal: Positive for gait problem (recent fall).  Skin: Negative.   Neurological: Negative for dizziness and headaches.  Psychiatric/Behavioral: Negative.  Negative for dysphoric mood. The patient is not nervous/anxious.   All other systems reviewed and are negative.    Physical Exam  BP (!) 142/80 (BP Location: Left Arm, Patient Position: Sitting, Cuff Size: Large)   Pulse 95   Temp (!) 97.5 F (36.4 C) (Temporal)   Ht 5\' 9"  (1.753 m)   Wt 227 lb 12.8 oz (103.3 kg)   SpO2 96%   BMI 33.64 kg/m   Wt Readings from Last 5 Encounters:  06/24/20 227 lb 12.8 oz (103.3 kg)  06/21/20 229 lb (103.9 kg)  05/15/20 227 lb 12.8 oz (103.3 kg)  05/09/20 227 lb 6.4 oz (103.1 kg)  04/23/20 228 lb 9.9 oz (103.7 kg)    BMI Readings from Last 5 Encounters:  06/24/20 33.64 kg/m  06/21/20 33.82 kg/m  05/15/20 34.64 kg/m  05/09/20 34.58 kg/m  04/23/20 34.76 kg/m     Physical Exam Vitals and nursing note reviewed.  Constitutional:      General: He is not in acute distress.    Appearance: Normal appearance. He is obese.  HENT:     Head: Normocephalic and atraumatic.     [name] Ear: Hearing, tympanic membrane, ear canal and external ear normal. There is no impacted cerumen.     Left Ear: Hearing, tympanic membrane, ear canal and external ear normal. There is no impacted cerumen.     Nose: Rhinorrhea present. No mucosal edema.     [name] Turbinates: Not enlarged.     Left Turbinates: Not enlarged.     Mouth/Throat:     Mouth: Mucous membranes are dry.     Pharynx: Oropharynx is clear. No oropharyngeal exudate.     Comments: +PND Eyes:     Pupils: Pupils are equal,  round, and reactive to light.  Cardiovascular:     Rate and Rhythm: Normal rate and regular rhythm.     Pulses: Normal pulses.     Heart sounds: Normal heart sounds. No murmur heard.   Pulmonary:     Effort: Pulmonary effort is normal.  Breath sounds: Normal breath sounds. No decreased breath sounds, wheezing or rales.  Musculoskeletal:     Cervical back: Normal range of motion.     [name] lower leg: No edema.     Left lower leg: No edema.  Lymphadenopathy:     Cervical: No cervical adenopathy.  Skin:    General: Skin is warm and dry.     Capillary Refill: Capillary refill takes less than 2 seconds.     Findings: No erythema or rash.  Neurological:     General: No focal deficit present.     Mental Status: He is alert and oriented to person, place, and time.     Motor: No weakness.     Coordination: Coordination normal.     Gait: Gait is intact. Gait normal.  Psychiatric:        Mood and Affect: Mood normal.        Behavior: Behavior normal. Behavior is cooperative.        Thought Content: Thought content normal.        Judgment: Judgment normal.       Assessment & Plan:   OSA on CPAP Plan: Continue CPAP therapy Follow-up with Dr. Annamaria Boots in 6 months  Sinusitis, acute maxillary Recent CT showing sinus infection Currently on amoxicillin Not on daily antihistamine, not using Flonase Not using nasal saline rinses Patient was previously asymptomatic  Plan: Continue amoxicillin Start daily antihistamine Encourage starting nasal saline rinses Encourage daily Flonase use after nasal saline rinse 32-month follow-up with Dr. Annamaria Boots  Narcolepsy without cataplexy Plan: Continue Adderall 1 tablet a day as needed, okay to increase to 2 or 3 tablets a day based off of symptoms Continue to nap as needed [place] PMP aware reviewed    Return in about 6 months (around 12/25/2020), or if symptoms worsen or fail to improve, for Follow up with Dr. Annamaria Boots.   Lauraine Rinne,  NP 06/24/2020   This appointment required 32 minutes of patient care (this includes precharting, chart review, review of results, face-to-face care, etc.).

## 2020-06-24 NOTE — Patient Instructions (Addendum)
You were seen today by Lauraine Rinne, NP  for:   1. OSA on CPAP  We recommend that you continue using your CPAP daily >>>Keep up the hard work using your device >>> Goal should be wearing this for the entire night that you are sleeping, at least 4 to 6 hours  Remember:  . Do not drive or operate heavy machinery if tired or drowsy.  . Please notify the supply company and office if you are unable to use your device regularly due to missing supplies or machine being broken.  . Work on maintaining a healthy weight and following your recommended nutrition plan  . Maintain proper daily exercise and movement  . Maintaining proper use of your device can also help improve management of other chronic illnesses such as: Blood pressure, blood sugars, and weight management.   BiPAP/ CPAP Cleaning:  >>>Clean weekly, with Dawn soap, and bottle brush.  Set up to air dry. >>> Wipe mask out daily with wet wipe or towelette    2. Chronic maxillary sinusitis  - fluticasone (FLONASE) 50 MCG/ACT nasal spray; Place 1 spray into both nostrils daily.  Dispense: 16 g; Refill: 3  Please start taking a daily antihistamine:  >>>choose one of: zyrtec, claritin, allegra, or xyzal  >>>these are over the counter medications  >>>can choose generic option  >>>take daily  >>>this medication helps with allergies, post nasal drip, and cough   Can start nasal saline rinses twice daily Use distilled water Shake well Get bottle lukewarm like a baby bottle   We recommend today:   Meds ordered this encounter  Medications  . fluticasone (FLONASE) 50 MCG/ACT nasal spray    Sig: Place 1 spray into both nostrils daily.    Dispense:  16 g    Refill:  3    Follow Up:    Return in about 6 months (around 12/25/2020), or if symptoms worsen or fail to improve, for Follow up with Dr. Annamaria Boots.   Please do your part to reduce the spread of COVID-19:      Reduce your risk of any infection  and COVID19 by using the  similar precautions used for avoiding the common cold or flu:  [name] Wash your hands often with soap and warm water for at least 20 seconds.  If soap and water are not readily available, use an alcohol-based hand sanitizer with at least 60% alcohol.  . If coughing or sneezing, cover your mouth and nose by coughing or sneezing into the elbow areas of your shirt or coat, into a tissue or into your sleeve (not your hands). Langley Gauss A MASK when in public  . Avoid shaking hands with others and consider head nods or verbal greetings only. . Avoid touching your eyes, nose, or mouth with unwashed hands.  . Avoid close contact with people who are sick. . Avoid places or events with large numbers of people in one location, like concerts or sporting events. . If you have some symptoms but not all symptoms, continue to monitor at home and seek medical attention if your symptoms worsen. . If you are having a medical emergency, call 911.   [facility] [facility] / e-Visit: [email or web address]         [facility]: [facility]: [phone]                   [facility]: [phone]  It is flu season:   >>> Best ways to protect herself from the flu: Receive the yearly flu vaccine, practice good hand hygiene washing with soap and also using hand sanitizer when available, eat a nutritious meals, get adequate rest, hydrate appropriately   Please contact the office if your symptoms worsen or you have concerns that you are not improving.   Thank you for choosing [facility] for your healthcare, and for allowing [place] to partner with you on your healthcare journey. I am thankful to be able to provide care to you today.   Wyn Quaker FNP-C

## 2020-06-24 NOTE — Assessment & Plan Note (Signed)
Plan: Continue Adderall 1 tablet a day as needed, okay to increase to 2 or 3 tablets a day based off of symptoms Continue to nap as needed [place] PMP aware reviewed

## 2020-06-24 NOTE — Assessment & Plan Note (Addendum)
Recent CT showing sinus infection Currently on amoxicillin Not on daily antihistamine, not using Flonase Not using nasal saline rinses Patient was previously asymptomatic  Plan: Continue amoxicillin Start daily antihistamine Encourage starting nasal saline rinses Encourage daily Flonase use after nasal saline rinse 42-month follow-up with Dr. Annamaria Boots

## 2020-07-31 ENCOUNTER — Ambulatory Visit (INDEPENDENT_AMBULATORY_CARE_PROVIDER_SITE_OTHER): Payer: Medicare HMO

## 2020-07-31 DIAGNOSIS — I495 Sick sinus syndrome: Secondary | ICD-10-CM | POA: Diagnosis not present

## 2020-07-31 LAB — CUP PACEART REMOTE DEVICE CHECK
Battery Remaining Percentage: 65 %
Brady Statistic RA Percent Paced: 91 %
Brady Statistic RV Percent Paced: 9 %
Date Time Interrogation Session: 20210929072109
Implantable Lead Implant Date: 20170525
Implantable Lead Implant Date: 20170525
Implantable Lead Location: 753859
Implantable Lead Location: 753860
Implantable Lead Model: 377
Implantable Lead Model: 377
Implantable Lead Serial Number: 49470224
Implantable Lead Serial Number: 49485613
Implantable Pulse Generator Implant Date: 20170525
Lead Channel Impedance Value: 488 Ohm
Lead Channel Impedance Value: 546 Ohm
Lead Channel Pacing Threshold Amplitude: 0.6 V
Lead Channel Pacing Threshold Amplitude: 1.4 V
Lead Channel Pacing Threshold Pulse Width: 0.4 ms
Lead Channel Pacing Threshold Pulse Width: 0.4 ms
Lead Channel Sensing Intrinsic Amplitude: 10.5 mV
Lead Channel Sensing Intrinsic Amplitude: 3.7 mV
Lead Channel Setting Pacing Amplitude: 2.4 V
Lead Channel Setting Pacing Amplitude: 2.4 V
Lead Channel Setting Pacing Pulse Width: 0.4 ms
Pulse Gen Model: 394969
Pulse Gen Serial Number: 68798589

## 2020-08-04 DIAGNOSIS — M9904 Segmental and somatic dysfunction of sacral region: Secondary | ICD-10-CM | POA: Diagnosis not present

## 2020-08-04 DIAGNOSIS — M9903 Segmental and somatic dysfunction of lumbar region: Secondary | ICD-10-CM | POA: Diagnosis not present

## 2020-08-04 DIAGNOSIS — M5136 Other intervertebral disc degeneration, lumbar region: Secondary | ICD-10-CM | POA: Diagnosis not present

## 2020-08-04 DIAGNOSIS — M9905 Segmental and somatic dysfunction of pelvic region: Secondary | ICD-10-CM | POA: Diagnosis not present

## 2020-08-04 NOTE — Progress Notes (Signed)
Remote pacemaker transmission.   

## 2020-08-07 DIAGNOSIS — M9905 Segmental and somatic dysfunction of pelvic region: Secondary | ICD-10-CM | POA: Diagnosis not present

## 2020-08-07 DIAGNOSIS — M5136 Other intervertebral disc degeneration, lumbar region: Secondary | ICD-10-CM | POA: Diagnosis not present

## 2020-08-07 DIAGNOSIS — M9903 Segmental and somatic dysfunction of lumbar region: Secondary | ICD-10-CM | POA: Diagnosis not present

## 2020-08-07 DIAGNOSIS — M9904 Segmental and somatic dysfunction of sacral region: Secondary | ICD-10-CM | POA: Diagnosis not present

## 2020-08-08 ENCOUNTER — Ambulatory Visit
Admission: RE | Admit: 2020-08-08 | Discharge: 2020-08-08 | Disposition: A | Discharge status: Home / Self Care | Payer: Medicare HMO | Source: Ambulatory Visit | Attending: Family Medicine | Admitting: Family Medicine

## 2020-08-08 ENCOUNTER — Other Ambulatory Visit: Payer: Self-pay

## 2020-08-08 ENCOUNTER — Other Ambulatory Visit: Payer: Self-pay | Admitting: Family Medicine

## 2020-08-08 DIAGNOSIS — E7800 Pure hypercholesterolemia, unspecified: Secondary | ICD-10-CM | POA: Diagnosis not present

## 2020-08-08 DIAGNOSIS — M5136 Other intervertebral disc degeneration, lumbar region: Secondary | ICD-10-CM | POA: Diagnosis not present

## 2020-08-08 DIAGNOSIS — R5383 Other fatigue: Secondary | ICD-10-CM | POA: Diagnosis not present

## 2020-08-08 DIAGNOSIS — M545 Low back pain, unspecified: Secondary | ICD-10-CM | POA: Diagnosis not present

## 2020-08-08 DIAGNOSIS — E119 Type 2 diabetes mellitus without complications: Secondary | ICD-10-CM | POA: Diagnosis not present

## 2020-08-08 DIAGNOSIS — G4730 Sleep apnea, unspecified: Secondary | ICD-10-CM | POA: Diagnosis not present

## 2020-08-08 DIAGNOSIS — I10 Essential (primary) hypertension: Secondary | ICD-10-CM | POA: Diagnosis not present

## 2020-08-08 DIAGNOSIS — I679 Cerebrovascular disease, unspecified: Secondary | ICD-10-CM | POA: Diagnosis not present

## 2020-08-08 DIAGNOSIS — E1151 Type 2 diabetes mellitus with diabetic peripheral angiopathy without gangrene: Secondary | ICD-10-CM | POA: Diagnosis not present

## 2020-08-08 DIAGNOSIS — G47419 Narcolepsy without cataplexy: Secondary | ICD-10-CM | POA: Diagnosis not present

## 2020-08-08 DIAGNOSIS — Z23 Encounter for immunization: Secondary | ICD-10-CM | POA: Diagnosis not present

## 2020-08-11 DIAGNOSIS — M9903 Segmental and somatic dysfunction of lumbar region: Secondary | ICD-10-CM | POA: Diagnosis not present

## 2020-08-11 DIAGNOSIS — M9904 Segmental and somatic dysfunction of sacral region: Secondary | ICD-10-CM | POA: Diagnosis not present

## 2020-08-11 DIAGNOSIS — M5136 Other intervertebral disc degeneration, lumbar region: Secondary | ICD-10-CM | POA: Diagnosis not present

## 2020-08-11 DIAGNOSIS — M9905 Segmental and somatic dysfunction of pelvic region: Secondary | ICD-10-CM | POA: Diagnosis not present

## 2020-08-12 DIAGNOSIS — H401122 Primary open-angle glaucoma, left eye, moderate stage: Secondary | ICD-10-CM | POA: Diagnosis not present

## 2020-08-13 DIAGNOSIS — M9904 Segmental and somatic dysfunction of sacral region: Secondary | ICD-10-CM | POA: Diagnosis not present

## 2020-08-13 DIAGNOSIS — M9903 Segmental and somatic dysfunction of lumbar region: Secondary | ICD-10-CM | POA: Diagnosis not present

## 2020-08-13 DIAGNOSIS — M5136 Other intervertebral disc degeneration, lumbar region: Secondary | ICD-10-CM | POA: Diagnosis not present

## 2020-08-13 DIAGNOSIS — M9905 Segmental and somatic dysfunction of pelvic region: Secondary | ICD-10-CM | POA: Diagnosis not present

## 2020-08-15 DIAGNOSIS — G4733 Obstructive sleep apnea (adult) (pediatric): Secondary | ICD-10-CM | POA: Diagnosis not present

## 2020-08-18 DIAGNOSIS — M9904 Segmental and somatic dysfunction of sacral region: Secondary | ICD-10-CM | POA: Diagnosis not present

## 2020-08-18 DIAGNOSIS — M5136 Other intervertebral disc degeneration, lumbar region: Secondary | ICD-10-CM | POA: Diagnosis not present

## 2020-08-18 DIAGNOSIS — M9905 Segmental and somatic dysfunction of pelvic region: Secondary | ICD-10-CM | POA: Diagnosis not present

## 2020-08-18 DIAGNOSIS — M9903 Segmental and somatic dysfunction of lumbar region: Secondary | ICD-10-CM | POA: Diagnosis not present

## 2020-08-21 DIAGNOSIS — M9903 Segmental and somatic dysfunction of lumbar region: Secondary | ICD-10-CM | POA: Diagnosis not present

## 2020-08-21 DIAGNOSIS — M5136 Other intervertebral disc degeneration, lumbar region: Secondary | ICD-10-CM | POA: Diagnosis not present

## 2020-08-21 DIAGNOSIS — M9904 Segmental and somatic dysfunction of sacral region: Secondary | ICD-10-CM | POA: Diagnosis not present

## 2020-08-21 DIAGNOSIS — M9905 Segmental and somatic dysfunction of pelvic region: Secondary | ICD-10-CM | POA: Diagnosis not present

## 2020-08-28 DIAGNOSIS — M9904 Segmental and somatic dysfunction of sacral region: Secondary | ICD-10-CM | POA: Diagnosis not present

## 2020-08-28 DIAGNOSIS — M5136 Other intervertebral disc degeneration, lumbar region: Secondary | ICD-10-CM | POA: Diagnosis not present

## 2020-08-28 DIAGNOSIS — M9903 Segmental and somatic dysfunction of lumbar region: Secondary | ICD-10-CM | POA: Diagnosis not present

## 2020-08-28 DIAGNOSIS — M9905 Segmental and somatic dysfunction of pelvic region: Secondary | ICD-10-CM | POA: Diagnosis not present

## 2020-09-04 DIAGNOSIS — M9904 Segmental and somatic dysfunction of sacral region: Secondary | ICD-10-CM | POA: Diagnosis not present

## 2020-09-04 DIAGNOSIS — M5136 Other intervertebral disc degeneration, lumbar region: Secondary | ICD-10-CM | POA: Diagnosis not present

## 2020-09-04 DIAGNOSIS — M9905 Segmental and somatic dysfunction of pelvic region: Secondary | ICD-10-CM | POA: Diagnosis not present

## 2020-09-04 DIAGNOSIS — M9903 Segmental and somatic dysfunction of lumbar region: Secondary | ICD-10-CM | POA: Diagnosis not present

## 2020-09-10 DIAGNOSIS — M9905 Segmental and somatic dysfunction of pelvic region: Secondary | ICD-10-CM | POA: Diagnosis not present

## 2020-09-10 DIAGNOSIS — M5136 Other intervertebral disc degeneration, lumbar region: Secondary | ICD-10-CM | POA: Diagnosis not present

## 2020-09-10 DIAGNOSIS — M9903 Segmental and somatic dysfunction of lumbar region: Secondary | ICD-10-CM | POA: Diagnosis not present

## 2020-09-10 DIAGNOSIS — M9904 Segmental and somatic dysfunction of sacral region: Secondary | ICD-10-CM | POA: Diagnosis not present

## 2020-09-11 ENCOUNTER — Telehealth: Payer: Self-pay

## 2020-09-11 NOTE — Telephone Encounter (Signed)
Pro Fee Billing contacted office by email and said patient disputes NO Show fee - that appt was cancelled by phone reminder call.  I pulled record - it shows a machine answered and no other in bound call with our office is documented.

## 2020-09-15 ENCOUNTER — Ambulatory Visit: Payer: Medicare HMO | Admitting: Adult Health

## 2020-09-15 ENCOUNTER — Encounter: Payer: Self-pay | Admitting: Adult Health

## 2020-09-15 NOTE — Progress Notes (Deleted)
[facility] [address] [place]. [place] [address] [phone]       STROKE FOLLOW UP NOTE  [name] Date of Birth:  12-20-1940 Medical Record Number:  [id number]   Reason for Referral: stroke follow up    SUBJECTIVE:   CHIEF COMPLAINT:  No chief complaint on file.   HPI:   Today, 09/15/2020, [name] returns for stroke follow-up.  Stable from stroke standpoint with residual ***.  Denies new or worsening stroke/TIA symptoms.  Remains on Plavix and atorvastatin for secondary stroke prevention without side effects.  Blood pressure today ***.  Glucose level stable per patient.  Reports ongoing compliance with CPAP for OSA management managed by pulmonology.    History provided for reference purposes only Update 05/15/2020 JM: Jordan [name] is being seen for hospital follow-up accompanied by his wife.  He was discharged home from CIR on 04/29/2020 after 11 day stay.  Residual deficits of mild imbalance and mildly decreased sensation R finger tips and decreased right hand fine motor skills. Does report ongoing improvement currently working with home health PT. he has had gait impairment ongoing and right hand decreased fine motor control with tremors and was evaluated by Dr. Rexene Alberts on 12/11/2019 for possible Parkinson's but was not felt this was Parkinson's related.  He does continue to use cane for ambulation and denies any recent falls.  He is questioning return to driving as he was advised no driving at discharge until further cleared.  Completed 3 weeks DAPT and continues on Plavix alone without bleeding or bruising.  Continues on atorvastatin 40 mg daily without myalgias.  Blood pressure today 118/84.  Glucose levels stable.  Endorses ongoing use of CPAP for OSA management.  No further concerns at this time.   Stroke admission 04/14/2020 [name] is a 78 y.o. male with history of diabetes, stroke, hypertension, hyperlipidemia, permanent cardiac  pacemaker  who presented on 04/14/2020 with R sided decreased sensation.  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  Previously on clopidogrel and recommended DAPT for 3 weeks then clopidogrel alone.  Presented in hypertensive urgency with BP 204/116 and recommended long-term BP goal normotensive range.  LDL 57 and recommended continuation of atorvastatin 40 mg daily.  Uncontrolled DM with A1c 7.9.  Other stroke risk factors include prior stroke 01/2018 with multifocal punctate infarcts concerning for embolic pattern, former tobacco use, EtOH use, obesity, CAD, OSA on CPAP and permanent cardiac pacemaker.  Evaluated by therapies who recommended CIR for residual deficits of imbalance and safety affecting ADLs and mobility.  Discharged to CIR on 04/18/2020.  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 Small vessel disease. Sinus dz. ASPECTS 10.     CTA head & neck no LVO. Aortic atherosclerosis.   MRI  L thalamic infarct. Small vessel disease. Atrophy.   2D Echo EF 65-70%. No source of embolus. LA mildly dilated.   Pacemaker interrogation no afib  LDL 57 -continued atorvastatin 40 mg daily  HgbA1c 7.9  Lovenox 40 mg sq daily for VTE prophylaxis  clopidogrel 75 mg daily prior to admission, now on aspirin 81 mg daily and clopidogrel 75 mg daily. Continue DAPT x 3 weeks then plavix alone    Therapy recommendations:  CIR  Disposition:  CIR      ROS:   14 system review of systems performed and negative with exception of gait impairment,  imbalance, numbness/tingling and right hand weakness  PMH:  Past Medical History:  Diagnosis Date  . Arthritis    R shoulder, bone spur  . Arthritis    "hands" (03/25/2016)  . Chronic lower back pain   . Coronary heart disease    Dr Pernell Dupre  . H/O cardiovascular stress test    perhaps last one was 2009  . Heart murmur    12/29/11 echo:  mild MR, no AS, trivial TR  . History of gout   . Hyperlipidemia   . Hypertension    saw Dr. Linard Millers last earl;y- 2014, cardiac cath. last done ?2009, blocks seen didn't require any intervention at that point.  . 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 OSA on CPAP    NPSG 12-13-98 AHI 22.7  . PONV (postoperative nausea and vomiting)   . Presence of permanent cardiac pacemaker   . Shortness of breath    with exertion   . Stroke ([place])   . Type II diabetes mellitus (HCC)     PSH:  Past Surgical History:  Procedure Laterality Date  . CARDIAC CATHETERIZATION  2009?  . EP IMPLANTABLE DEVICE N/A 03/25/2016   Procedure: Pacemaker Implant - Dual Chamber;  Surgeon: Evans Lance, MD;  Location: [facility];  Service: Cardiovascular;  Laterality: N/A;  . FRACTURE SURGERY    . INSERT / REPLACE / REMOVE PACEMAKER  03/24/2016  . LEFT AND RIGHT HEART CATHETERIZATION WITH CORONARY ANGIOGRAM N/A 06/05/2014   Procedure: LEFT AND RIGHT HEART CATHETERIZATION WITH CORONARY ANGIOGRAM;  Surgeon: Sinclair Grooms, MD;  Location: [facility];  Service: Cardiovascular;  Laterality: N/A;  . NASAL FRACTURE SURGERY    . SHOULDER ARTHROSCOPY WITH ROTATOR CUFF REPAIR AND SUBACROMIAL DECOMPRESSION Right 08/16/2013   Procedure: SHOULDER ARTHROSCOPY WITH ROTATOR CUFF REPAIR AND SUBACROMIAL DECOMPRESSION;  Surgeon: [name], MD;  Location: [facility];  Service: Orthopedics;  Laterality: Right;  Right shoulder arthroscopy rotator cuff repair, subacromial decompression.  . TONSILLECTOMY      Social History:  Social History   Socioeconomic History  . Marital status: Married    Spouse name: Not on file  . Number of children: Not on file  . Years of education: Not on file  . Highest education level: Not on file  Occupational History  . Occupation: Chiropodist  . Occupation: [place] [employer]  Tobacco Use  . Smoking status: Former Smoker    Packs/day: 2.50    Years:  6.00    Pack years: 15.00    Types: Cigarettes    Quit date: 06/05/1967    Years since quitting: 53.3  . Smokeless tobacco: Never Used  Vaping Use  . Vaping Use: Never used  Substance and Sexual Activity  . Alcohol use: Yes    Comment: rare  . Drug use: No  . Sexual activity: Not Currently  Other Topics Concern  . Not on file  Social History Narrative  . Not on file   Social Determinants of Health   Financial Resource Strain:   . Difficulty of Paying Living Expenses: Not on file  Food Insecurity:   . Worried About Charity fundraiser in the Last Year: Not on file  . Ran Out of Food in the Last Year: Not on file  Transportation Needs:   . Lack of Transportation (Medical): Not on file  . Lack of Transportation (Non-Medical): Not on file  Physical Activity:   .  Days of Exercise per Week: Not on file  . Minutes of Exercise per Session: Not on file  Stress:   . Feeling of Stress : Not on file  Social Connections:   . Frequency of Communication with Friends and Family: Not on file  . Frequency of Social Gatherings with Friends and Family: Not on file  . Attends Religious Services: Not on file  . Active Member of Clubs or Organizations: Not on file  . Attends Archivist Meetings: Not on file  . Marital Status: Not on file  Intimate Partner Violence:   . Fear of Current or Ex-Partner: Not on file  . Emotionally Abused: Not on file  . Physically Abused: Not on file  . Sexually Abused: Not on file    Family History:  Family History  Problem Relation Age of Onset  . Sleep apnea Sister   . Colon cancer Sister     Medications:   Current Outpatient Medications on File Prior to Visit  Medication Sig Dispense Refill  . amoxicillin (AMOXIL) 500 MG capsule Take 1 capsule (500 mg total) by mouth 3 (three) times daily. 21 capsule 0  . amphetamine-dextroamphetamine (ADDERALL) 10 MG tablet 1-3 tabs daily as needed 90 tablet 0  . amphetamine-dextroamphetamine (ADDERALL) 10  MG tablet Take 10 mg by mouth daily with breakfast.    . atorvastatin (LIPITOR) 40 MG tablet Take 0.5 tablets (20 mg total) by mouth daily before breakfast. 30 tablet 0  . atorvastatin (LIPITOR) 40 MG tablet Take 40 mg by mouth daily.    . clopidogrel (PLAVIX) 75 MG tablet Take 1 tablet (75 mg total) by mouth daily. 30 tablet 1  . clopidogrel (PLAVIX) 75 MG tablet Take 75 mg by mouth daily.    . Coenzyme Q10 (CO Q-10 PO) Take 1 capsule by mouth in the morning and at bedtime.    . fluticasone (FLONASE) 50 MCG/ACT nasal spray Place 1 spray into both nostrils daily. 16 g 3  . lisinopril (ZESTRIL) 10 MG tablet Take 10 mg by mouth daily.    . metFORMIN (GLUCOPHAGE-XR) 500 MG 24 hr tablet Take 4 tablets (2,000 mg total) by mouth daily with breakfast. 120 tablet 0  . metFORMIN (GLUCOPHAGE-XR) 500 MG 24 hr tablet Take 1,000 mg by mouth 2 (two) times daily.    . Multiple Vitamin (MULTIVITAMIN WITH MINERALS) TABS tablet Take 1 tablet by mouth daily.    [name] NITROSTAT 0.4 MG SL tablet PLACE 1 TABLET (0.4 MG TOTAL) UNDER THE TONGUE EVERY 5 (FIVE) MINUTES AS NEEDED FOR CHEST PAIN. (Patient taking differently: Place 0.4 mg under the tongue every 5 (five) minutes as needed for chest pain. ) 25 tablet 5  . Omega-3 Fatty Acids (OMEGA-3 PO) Take 1 capsule by mouth 5 (five) times daily.    [name] PRESCRIPTION MEDICATION See admin instructions. CPAP- At bedtime    . terbinafine (LAMISIL) 1 % cream Apply topically 2 (two) times daily. 30 g 1   No current facility-administered medications on file prior to visit.    Allergies:  Not on File    OBJECTIVE:  Physical Exam  There were no vitals filed for this visit. There is no height or weight on file to calculate BMI. No exam data present  General: well developed, well nourished,  pleasant elderly Caucasian male, seated, in no evident distress Head: head normocephalic and atraumatic.   Neck: supple with no carotid or supraclavicular bruits Cardiovascular: regular  rate and rhythm, no murmurs Musculoskeletal: no deformity Skin:  no rash/petichiae Vascular:  Normal pulses all extremities   Neurologic Exam Mental Status: Awake and fully alert.   Fluent speech and language.  Oriented to place and time. Recent and remote memory intact. Attention span, concentration and fund of knowledge appropriate. Mood and affect appropriate.  Cranial Nerves: Pupils equal, briskly reactive to light. Extraocular movements full without nystagmus. Visual fields full to confrontation. Hearing intact. Facial sensation intact. Face, tongue, palate moves normally and symmetrically.  Motor: Normal bulk and tone. Normal Jordan [name] in all tested extremity muscles. Sensory.: intact to touch , pinprick , position and vibratory sensation.  Coordination: Rapid alternating movements normal in all extremities except slightly decreased right hand. Finger-to-nose and heel-to-shin performed accurately bilaterally.  Slight decreased right hand fine motor control.  Mild postural tremor RUE without significant action tremor or resting tremor. Gait and Station: Arises from chair with mild difficulty. Stance is slightly hunched. Gait demonstrates  broad-based gait with normal stride length and mild imbalance with use of cane Reflexes: 1+ and symmetric. Toes downgoing.        ASSESSMENT/PLAN: Jordan [name] is a 79 y.o. 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 Vascular risk factors include HTN, HLD, DM, prior stroke 01/2018, EtOH use, obesity, CAD, OSA on CPAP and pacer.      1. Left thalamic stroke:  a. Residual deficits: Imbalance and decreased right hand sensation and fine motor control.  Recommend continued participation in home health PT and to consider additional outpatient therapy once completed. b. Released to return to driving as he has recovered well with minimal  residual deficits that would not impair driving ability or safety concerns.  Recommended gradual return to driving with further instructions provided in AVS c. Continue clopidogrel 75 mg daily  and atorvastatin 40 mg daily for secondary stroke prevention. d. Discussed secondary stroke prevention measures and importance of close PCP follow-up for aggressive risk factor management including HTN, HLD and DM  2. HTN: BP goal<130/90.  Stable on lisinopril per PCP 3. HLD: LDL goal<70.  Recent LDL 57.  On atorvastatin per PCP 4. DMII: A1c goal<7.0.  Recent A1c 7.9.  On Metformin per PCP 5. OSA on CPAP: Encouraged ongoing compliance and follow-up with pulmonology    Follow up in 4 months or call earlier if needed   I spent 45 minutes of face-to-face and non-face-to-face time with patient and wife.  This included previsit chart review, lab review, study review, order entry, electronic health record documentation, patient education regarding recent stroke, residual deficits, importance of managing stroke risk factors and answered all questions to patient satisfaction    Frann Rider, [facility]  [facility] [address] [place], [place] [address]  Phone [phone] Fax [phone] Note: This document was prepared with digital dictation and possible smart phrase technology. Any transcriptional errors that result from this process are unintentional.

## 2020-09-16 ENCOUNTER — Encounter: Payer: Self-pay | Admitting: Adult Health

## 2020-09-16 ENCOUNTER — Ambulatory Visit: Payer: Medicare HMO | Admitting: Adult Health

## 2020-09-16 VITALS — BP 126/73 | HR 73 | Ht 68.0 in | Wt 230.0 lb

## 2020-09-16 DIAGNOSIS — E1159 Type 2 diabetes mellitus with other circulatory complications: Secondary | ICD-10-CM | POA: Diagnosis not present

## 2020-09-16 DIAGNOSIS — I639 Cerebral infarction, unspecified: Secondary | ICD-10-CM

## 2020-09-16 DIAGNOSIS — E785 Hyperlipidemia, unspecified: Secondary | ICD-10-CM

## 2020-09-16 DIAGNOSIS — G47419 Narcolepsy without cataplexy: Secondary | ICD-10-CM

## 2020-09-16 DIAGNOSIS — G4733 Obstructive sleep apnea (adult) (pediatric): Secondary | ICD-10-CM

## 2020-09-16 DIAGNOSIS — I6381 Other cerebral infarction due to occlusion or stenosis of small artery: Secondary | ICD-10-CM

## 2020-09-16 DIAGNOSIS — G214 Vascular parkinsonism: Secondary | ICD-10-CM

## 2020-09-16 DIAGNOSIS — I10 Essential (primary) hypertension: Secondary | ICD-10-CM

## 2020-09-16 NOTE — Patient Instructions (Addendum)
Recommend further discussing your narcolepsy medication options with Dr. Annamaria Boots or Dr. Kenton Kingfisher such as use of modafinil, armodafinil. Sunosi, etc. as stopping Adderall as this can worsen tremors, increase heart rate, increase blood pressure and can cause headaches.   Continue clopidogrel 75 mg daily  and atorvastatin  for secondary stroke prevention  Continue to follow up with PCP regarding cholesterol and blood pressure management  Maintain strict control of hypertension with blood pressure goal below 130/90 and cholesterol with LDL cholesterol (bad cholesterol) goal below 70 mg/dL.     Followup in the future with me in 6 months or call earlier if needed     Thank you for coming to see [place] at [facility]. I hope we have been able to provide you high quality care today.  You may receive a patient satisfaction survey over the next few weeks. We would appreciate your feedback and comments so that we may continue to improve ourselves and the health of our patients.    Narcolepsy Narcolepsy is a neurological disorder that causes people to fall asleep suddenly and without control (have sleep attacks) during the daytime. It is a lifelong disorder. Narcolepsy disrupts the sleep cycle at night, which then causes daytime sleepiness. What are the causes? The cause of narcolepsy is not fully understood, but it may be related to:  Low levels of hypocretin, a chemical (neurotransmitter) in the brain that controls sleep and wake cycles. Hypocretin imbalance may be caused by: ? Abnormal genes that are passed from parent to child (inherited). ? An autoimmune disease in which the body's defense system (immune system) attacks the brain cells that make hypocretin.  Infection, tumor, or injury in the area of the brain that controls sleep.  Exposure to poisons (toxins), such as heavy metals, pesticides, and secondhand smoke. What are the signs or symptoms? Symptoms of this condition  include:  Excessive daytime sleepiness. This is the most common symptom and is usually the first symptom you will notice. This may affect your performance at work or school.  Sleep attacks. You may fall asleep in the middle of an activity, especially low-energy activities like reading or watching TV.  Feeling like you cannot think clearly and trouble focusing or remembering things. You may also feel depressed.  Sudden muscle weakness (cataplexy). When this occurs, your speech may become slurred, or your knees may buckle. Cataplexy is usually triggered by surprise, anger, fear, or laughter.  Losing the ability to speak or move (sleep paralysis). This may occur just as you start to fall asleep or wake up. You will be aware of the paralysis. It usually lasts for just a few seconds or minutes.  Seeing, hearing, tasting, smelling, or feeling things that are not real (hallucinations). Hallucinations may occur with sleep paralysis. They can happen when you are falling asleep, waking up, or dozing.  Trouble staying asleep at night (insomnia) and restless sleep. How is this diagnosed? This condition may be diagnosed based on:  A physical exam to rule out any other problems that may be causing your symptoms. You may be asked to write down your sleeping patterns for several weeks in a sleep diary. This will help your health care provider make a diagnosis.  Sleep studies that measure how well your REM sleep is regulated. These tests also measure your heart rate, breathing, movement, and brain waves. These tests include: ? An overnight sleep study (polysomnogram). ? A daytime sleep study that is done while you take several naps during the  day (multiple sleep latency test, MSLT). This test measures how quickly you fall asleep and how quickly you enter REM sleep.  Removal of spinal fluid to measure hypocretin levels. How is this treated? There is no cure for this condition, but treatment can help relieve  symptoms. Treatment may include:  Lifestyle and sleeping strategies to help you cope with the condition, such as: ? Exercising regularly. ? Maintaining a regular sleep schedule. ? Avoiding caffeine and large meals before bed.  Medicines. These may include: ? Medicines that help keep you awake and alert (stimulants) to fight daytime sleepiness. ? Medicines that treat depression (antidepressants). These may be used to treat cataplexy. ? Sodium oxybate. This is a strong medicine to help you relax (sedative) that you may take at night. It can help control daytime sleepiness and cataplexy.  Other treatments may include mental health counseling or joining a support group. Follow these instructions at home: Sleeping habits   Get about 8 hours of sleep every night.  Go to sleep and get up at about the same time every day.  Keep your bedroom dark, quiet, and comfortable.  When you feel very tired, take short naps. Schedule naps so that you take them at about the same time every day.  Before bedtime: ? Avoid bright lights and screens. ? Relax. Try activities like reading or taking a warm bath. Activity  Get at least 20 minutes of exercise every day. This will help you sleep better at night and reduce daytime sleepiness.  Avoid exercising within 3 hours of bedtime.  Do not drive or use heavy machinery if you are sleepy. If possible, take a nap before driving.  Do not swim or go out on the water without a life jacket. Eating and drinking  Do not drink alcohol or caffeinated beverages within 4-5 hours of bedtime.  Do not eat a large meal before bedtime.  Eat meals at about the same times every day. General instructions   Take over-the-counter and prescription medicines only as told by your health care provider.  Keep a sleep diary as told by your health care provider.  Tell your employer or teachers that you have narcolepsy. You may be able to adjust your schedule to include  time for naps.  Do not use any products that contain nicotine or tobacco, such as cigarettes, e-cigarettes, and chewing tobacco. If you need help quitting, ask your health care provider.  Keep all follow-up visits as told by your health care provider. This is important. Where to find more information  [employer] of Neurological Disorders: [employer]Box[employer].it Contact a health care provider if:  Your symptoms are not getting better.  You have increasingly high blood pressure (hypertension).  You have changes in your heart rhythm.  You are having a hard time determining what is real and what is not (psychosis). Get help right away if you:  Hurt yourself during a sleep attack or an attack of cataplexy.  Have chest pain.  Have trouble breathing. These symptoms may represent a serious problem that is an emergency. Do not wait to see if the symptoms will go away. Get medical help right away. Call your local emergency services (911 in the [place] Do not drive yourself to the hospital. Summary  Narcolepsy is a neurological disorder that causes people to fall asleep suddenly, and without control, during the daytime (sleep attacks). It is a lifelong disorder.  There is no cure for this condition, but treatment can help relieve symptoms.  Go  to sleep and get up at about the same time every day. Follow instructions about sleep and activities as told by your health care provider.  Take over-the-counter and prescription medicines only as told by your health care provider. This information is not intended to replace advice given to you by your health care provider. Make sure you discuss any questions you have with your health care provider. Document Revised: 05/30/2019 Document Reviewed: 05/30/2019 Elsevier Patient Education  [place].

## 2020-09-16 NOTE — Progress Notes (Signed)
[facility] [address] [place]. [place] [address] [phone]       STROKE FOLLOW UP NOTE  [name] Date of Birth:  1941/06/26 Medical Record Number:  [id number]   Reason for Referral: stroke follow up    SUBJECTIVE:   CHIEF COMPLAINT:  Chief Complaint  Patient presents with  . Follow-up    tx rm, with wife, c/o fatigue, tremors in right hand,     HPI:   Today, 09/15/2020, Jordan [name] returns for stroke follow-up accompanied by his wife.  He has been doing well since prior visit with residual decreased right hand dexterity and decreased sensory right fingertips.  Denies new or worsening stroke/TIA symptoms. He does complain of short-term memory loss such as forgetting an actors name or random fact.  He also continues to experience R>L upper extremity tremors and gait impairment with imbalance with fall in 06/2020 resulting in nasal bone fracture.  Wife reports shuffling type gait when fatigued.  Remains on Plavix and atorvastatin for secondary stroke prevention without side effects.  Blood pressure today 126/73.  Glucose level stable per patient.  Reports ongoing compliance with CPAP for OSA management managed by pulmonology Dr. Annamaria Boots.  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  He complains of excessive daytime fatigue, vivid dreams and occasional insomnia. Per patient, he has not trialed any other medication for narcolepsy.  No further concerns at this time.    History provided for reference purposes only Update 05/15/2020 JM: Jordan [name] is being seen for hospital follow-up accompanied by his wife.  He was discharged home from CIR on 04/29/2020 after 11 day stay.  Residual deficits of mild imbalance and mildly decreased sensation R finger tips and decreased right hand fine motor skills. Does report ongoing improvement currently working with home health PT. he has had gait impairment ongoing and right hand  decreased fine motor control with tremors and was evaluated by Dr. Rexene Alberts on 12/11/2019 for possible Parkinson's but was not felt this was Parkinson's related.  He does continue to use cane for ambulation and denies any recent falls.  He is questioning return to driving as he was advised no driving at discharge until further cleared.  Completed 3 weeks DAPT and continues on Plavix alone without bleeding or bruising.  Continues on atorvastatin 40 mg daily without myalgias.  Blood pressure today 118/84.  Glucose levels stable.  Endorses ongoing use of CPAP for OSA management.  No further concerns at this time.   Stroke admission 04/14/2020 Jordan [name] is a 79 y.o. male with history of diabetes, stroke, hypertension, hyperlipidemia, permanent cardiac pacemaker  who presented on 04/14/2020 with R sided decreased sensation.  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  Previously on clopidogrel and recommended DAPT for 3 weeks then clopidogrel alone.  Presented in hypertensive urgency with BP 204/116 and recommended long-term BP goal normotensive range.  LDL 57 and recommended continuation of atorvastatin 40 mg daily.  Uncontrolled DM with A1c 7.9.  Other stroke risk factors include prior stroke 01/2018 with multifocal punctate infarcts concerning for embolic pattern, former tobacco use, EtOH use, obesity, CAD, OSA on CPAP and permanent cardiac pacemaker.  Evaluated by therapies who recommended CIR for residual deficits of imbalance and safety affecting ADLs and mobility.  Discharged to CIR on 04/18/2020.  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 Small vessel disease. Sinus dz. ASPECTS 10.     CTA head & neck no LVO. Aortic atherosclerosis.   MRI  L thalamic infarct. Small vessel disease. Atrophy.   2D Echo EF 65-70%. No source of embolus. LA mildly dilated.   Pacemaker interrogation no  afib  LDL 57 -continued atorvastatin 40 mg daily  HgbA1c 7.9  Lovenox 40 mg sq daily for VTE prophylaxis  clopidogrel 75 mg daily prior to admission, now on aspirin 81 mg daily and clopidogrel 75 mg daily. Continue DAPT x 3 weeks then plavix alone    Therapy recommendations:  CIR  Disposition:  CIR      ROS:   14 system review of systems performed and negative with exception of those listed in HPI  PMH:  Past Medical History:  Diagnosis Date  . Arthritis    R shoulder, bone spur  . Arthritis    "hands" (03/25/2016)  . Chronic lower back pain   . Coronary heart disease    Dr Pernell Dupre  . H/O cardiovascular stress test    perhaps last one was 2009  . Heart murmur    12/29/11 echo: mild MR, no AS, trivial TR  . History of gout   . Hyperlipidemia   . Hypertension    saw Dr. Linard Millers last earl;y- 2014, cardiac cath. last done ?2009, blocks seen didn't require any intervention at that point.  . Narcolepsy    [name] 04-11-97; Mean Latency 1.65min, SOREM 2  . OSA on CPAP    NPSG 12-13-98 AHI 22.7  . PONV (postoperative nausea and vomiting)   . Presence of permanent cardiac pacemaker   . Shortness of breath    with exertion   . Stroke ([place])   . Type II diabetes mellitus (HCC)     PSH:  Past Surgical History:  Procedure Laterality Date  . CARDIAC CATHETERIZATION  2009?  . EP IMPLANTABLE DEVICE N/A 03/25/2016   Procedure: Pacemaker Implant - Dual Chamber;  Surgeon: Evans Lance, MD;  Location: [facility];  Service: Cardiovascular;  Laterality: N/A;  . FRACTURE SURGERY    . INSERT / REPLACE / REMOVE PACEMAKER  03/24/2016  . LEFT AND RIGHT HEART CATHETERIZATION WITH CORONARY ANGIOGRAM N/A 06/05/2014   Procedure: LEFT AND RIGHT HEART CATHETERIZATION WITH CORONARY ANGIOGRAM;  Surgeon: Sinclair Grooms, MD;  Location: [facility];  Service: Cardiovascular;  Laterality: N/A;  . NASAL FRACTURE SURGERY    . SHOULDER ARTHROSCOPY WITH ROTATOR CUFF REPAIR AND SUBACROMIAL  DECOMPRESSION Right 08/16/2013   Procedure: SHOULDER ARTHROSCOPY WITH ROTATOR CUFF REPAIR AND SUBACROMIAL DECOMPRESSION;  Surgeon: [name], MD;  Location: [facility];  Service: Orthopedics;  Laterality: Right;  Right shoulder arthroscopy rotator cuff repair, subacromial decompression.  . TONSILLECTOMY      Social History:  Social History   Socioeconomic History  . Marital status: Married    Spouse name: Not on file  . Number of children: Not on file  . Years of education: Not on file  . Highest education level: Not on file  Occupational History  . Occupation: Chiropodist  . Occupation: [employer]  Tobacco Use  . Smoking status: Former Smoker    Packs/day: 2.50    Years: 6.00    Pack years: 15.00    Types: Cigarettes    Quit date: 06/05/1967    Years since quitting: 53.3  . Smokeless tobacco: Never Used  Vaping Use  . Vaping Use: Never used  Substance and Sexual Activity  . Alcohol use: Yes    Comment: rare  . Drug use: No  . Sexual activity: Not Currently  Other Topics Concern  . Not on file  Social History Narrative  . Not on file   Social Determinants of Health   Financial Resource Strain:   . Difficulty of Paying Living Expenses: Not on file  Food Insecurity:   . Worried About Charity fundraiser in the Last Year: Not on file  . Ran Out of Food in the Last Year: Not on file  Transportation Needs:   . Lack of Transportation (Medical): Not on file  . Lack of Transportation (Non-Medical): Not on file  Physical Activity:   . Days of Exercise per Week: Not on file  . Minutes of Exercise per Session: Not on file  Stress:   . Feeling of Stress : Not on file  Social Connections:   . Frequency of Communication with Friends and Family: Not on file  . Frequency of Social Gatherings with Friends and Family: Not on file  . Attends Religious Services: Not on file  . Active Member of Clubs or Organizations: Not on file  . Attends Theatre manager Meetings: Not on file  . Marital Status: Not on file  Intimate Partner Violence:   . Fear of Current or Ex-Partner: Not on file  . Emotionally Abused: Not on file  . Physically Abused: Not on file  . Sexually Abused: Not on file    Family History:  Family History  Problem Relation Age of Onset  . Sleep apnea Sister   . Colon cancer Sister     Medications:   Current Outpatient Medications on File Prior to Visit  Medication Sig Dispense Refill  . amphetamine-dextroamphetamine (ADDERALL) 10 MG tablet Take 10 mg by mouth daily with breakfast.    . atorvastatin (LIPITOR) 40 MG tablet Take 40 mg by mouth daily.    . clopidogrel (PLAVIX) 75 MG tablet Take 1 tablet (75 mg total) by mouth daily. 30 tablet 1  . Coenzyme Q10 (CO Q-10 PO) Take 1 capsule by mouth in the morning and at bedtime.    . fluticasone (FLONASE) 50 MCG/ACT nasal spray Place 1 spray into both nostrils daily. 16 g 3  . lisinopril (ZESTRIL) 10 MG tablet Take 10 mg by mouth daily.    . metFORMIN (GLUCOPHAGE-XR) 500 MG 24 hr tablet Take 4 tablets (2,000 mg total) by mouth daily with breakfast. 120 tablet 0  . Multiple Vitamin (MULTIVITAMIN WITH MINERALS) TABS tablet Take 1 tablet by mouth daily.    [name] NITROSTAT 0.4 MG SL tablet PLACE 1 TABLET (0.4 MG TOTAL) UNDER THE TONGUE EVERY 5 (FIVE) MINUTES AS NEEDED FOR CHEST PAIN. (Patient taking differently: Place 0.4 mg under the tongue every 5 (five) minutes as needed for chest pain. ) 25 tablet 5  . Omega-3 Fatty Acids (OMEGA-3 PO) Take 1 capsule by mouth 3 (three) times daily.     [name] PRESCRIPTION MEDICATION See admin instructions. CPAP- At bedtime     No current facility-administered medications on file prior to visit.    Allergies:  No Known Allergies    OBJECTIVE:  Physical Exam  Vitals:   09/16/20 1432  BP: 126/73  Pulse: 73  Weight: 230 lb (104.3 kg)  Height: 5\' 8"  (1.727 m)   Body mass index is 34.97 kg/m. No exam data present  General: well  developed, well nourished, very pleasant elderly Caucasian male, seated, in  no evident distress Head: head normocephalic and atraumatic.   Neck: supple with no carotid or supraclavicular bruits Cardiovascular: regular rate and rhythm, no murmurs Musculoskeletal: no deformity Skin:  no rash/petichiae Vascular:  Normal pulses all extremities   Neurologic Exam Mental Status: Awake and fully alert.   Fluent speech and language.  Oriented to place and time. Recent and remote memory intact. Attention span, concentration and fund of knowledge appropriate. Mood and affect appropriate.  Cranial Nerves: Pupils equal, briskly reactive to light. Extraocular movements full without nystagmus. Visual fields full to confrontation. Hearing intact. Facial sensation intact. Face, tongue, palate moves normally and symmetrically.  Motor: Normal bulk and tone. Normal strength in all tested extremity muscles. Sensory.: intact to touch , pinprick , position and vibratory sensation.  Coordination: Rapid alternating movements normal in all extremities except slightly decreased right hand. Finger-to-nose and heel-to-shin performed accurately bilaterally.  Slight decreased right hand fine motor control.  Mild and occasional right hand resting tremor.  B/l R>L UE postural tremor with mild R cogwheel rigidity. Mild R>L bradykinesia.  No evidence of resting tremor. Gait and Station: Arises from chair with mild difficulty. Stance is slightly hunched. Gait demonstrates broad-based gait with decreased stride length and mild imbalance and postural instability.  Currently ambulating without assistive device Reflexes: 1+ and symmetric. Toes downgoing.        ASSESSMENT/PLAN: Jordan [name] is a 79 y.o. 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 Vascular risk factors include HTN, HLD, DM, prior stroke 01/2018, EtOH  use, obesity, CAD, OSA on CPAP and pacer.     1. Left thalamic stroke:  a. Recovered well with mild residual deficits of slightly decreased right hand dexterity and right hand fingertip numbness b. Continue clopidogrel 75 mg daily  and atorvastatin 40 mg daily for secondary stroke prevention. c. Discussed secondary stroke prevention measures and importance of close PCP follow-up for aggressive risk factor management including HTN, HLD and DM  2. ?  Vascular parkinsonism: Previously evaluated by movement disorder specialist Dr. Rexene Alberts who did not feel symptoms related to Parkinson's disease.  Evidence of mild/occasional R hand resting tremor, b/l UE postural tremor, postural instability, gait impairment and cognitive complaints.  Advised patient and wife that I will further discuss with Dr. Leonie Man for possible treatment options such as trial of low-dose Sinemet for possible benefit.  Discussed importance of remaining active with routine physical activity and exercise and managing stroke risk factors 3. OSA on CPAP: Currently monitored by pulmonology Dr. Annamaria Boots with recent compliance report optimal management 4. Narcolepsy: Managed by Dr. Annamaria Boots with intermittent use of Adderall which could be contributing to his daytime fatigue, vivid dreams and occasional insomnia.  Discussed possibility of trialing different medications such as modafinil, armodafinil, Sunosi, etc. As these medications are typically well-tolerated and lower adverse effect profile.  They plan on further discussing with Dr. Annamaria Boots 5. HTN: BP goal<130/90.  Stable on lisinopril per PCP 6. HLD: LDL goal<70.  Recent LDL 57.  On atorvastatin per PCP 7. DMII: A1c goal<7.0.  Recent A1c 7.9.  On Metformin per PCP   Follow up in 6 months or call earlier if needed   CC:  [place] provider: Dr. Lawanda Cousins, Gwyndolyn Saxon, MD    I spent a prolonged 60 minutes of face-to-face and non-face-to-face time with patient and wife.  This included previsit chart  review, lab review, study review, order entry, electronic health record  documentation, patient education and discussion regarding history of prior strokes, residual deficits, tremors and gait impairment possibly related to vascular parkinsonism, use of CPAP for OSA management, history of narcolepsy, importance of managing stroke risk factors and answered all other questions to patient and wifes satisfaction    Frann Rider, AGNP-BC  [facility] [address] [place], [place] [address]  Phone [phone] Fax [phone] Note: This document was prepared with digital dictation and possible smart phrase technology. Any transcriptional errors that result from this process are unintentional.

## 2020-09-18 DIAGNOSIS — M9903 Segmental and somatic dysfunction of lumbar region: Secondary | ICD-10-CM | POA: Diagnosis not present

## 2020-09-18 DIAGNOSIS — M5136 Other intervertebral disc degeneration, lumbar region: Secondary | ICD-10-CM | POA: Diagnosis not present

## 2020-09-18 DIAGNOSIS — M9904 Segmental and somatic dysfunction of sacral region: Secondary | ICD-10-CM | POA: Diagnosis not present

## 2020-09-18 DIAGNOSIS — M9905 Segmental and somatic dysfunction of pelvic region: Secondary | ICD-10-CM | POA: Diagnosis not present

## 2020-09-22 ENCOUNTER — Telehealth: Payer: Self-pay | Admitting: Adult Health

## 2020-09-22 NOTE — Progress Notes (Signed)
I agree with the above plan 

## 2020-09-22 NOTE — Telephone Encounter (Signed)
Discuss with Dr. Leonie Man about sinemet??

## 2020-09-22 NOTE — Telephone Encounter (Signed)
Pt. asks if NP had gotten info. from Dr. Felecia Shelling that she was going to give to him. Please advise.

## 2020-09-23 MED ORDER — CARBIDOPA-LEVODOPA 25-100 MG PO TABS: 0.5000 | ORAL_TABLET | Freq: Two times a day (BID) | ORAL | 3 refills | Status: DC

## 2020-09-23 NOTE — Addendum Note (Signed)
Addended by: Mal Misty on: 09/23/2020 09:12 AM   Modules accepted: Orders

## 2020-09-23 NOTE — Telephone Encounter (Signed)
Discussed further with Dr. Leonie Man in regards to trial of low-dose Sinemet for possible benefit of parkinsonism.  As his tremors and gait interfere with daily activity and functioning, recommend trialing low-dose Sinemet 12.5/50 (0.5 tab) twice daily.  Please advise patient to monitor for dyskinesias (abnormal jerking type movements), worsening of tremors or gait, orthostatic/postural hypotension or mental status changes.  Please advise him to call with any questions or concerns or possible need of increasing in the future.

## 2020-09-23 NOTE — Telephone Encounter (Addendum)
I called pt and relayed that he is to take the 25/100 sinemet ( 1/2 tablet am and pm), watch for SE listed.  He verbalized understanding.

## 2020-09-24 DIAGNOSIS — M9904 Segmental and somatic dysfunction of sacral region: Secondary | ICD-10-CM | POA: Diagnosis not present

## 2020-09-24 DIAGNOSIS — M5136 Other intervertebral disc degeneration, lumbar region: Secondary | ICD-10-CM | POA: Diagnosis not present

## 2020-09-24 DIAGNOSIS — M9903 Segmental and somatic dysfunction of lumbar region: Secondary | ICD-10-CM | POA: Diagnosis not present

## 2020-09-24 DIAGNOSIS — M9905 Segmental and somatic dysfunction of pelvic region: Secondary | ICD-10-CM | POA: Diagnosis not present

## 2020-09-29 ENCOUNTER — Telehealth: Payer: Self-pay | Admitting: Adult Health

## 2020-09-29 MED ORDER — CARBIDOPA-LEVODOPA 25-100 MG PO TABS: 0.5000 | ORAL_TABLET | Freq: Three times a day (TID) | ORAL | 4 refills | Status: DC

## 2020-09-29 NOTE — Telephone Encounter (Signed)
Can try to increase Sinemet to half tab 3 times daily.  If no benefit over 2-week duration, may consider increasing further.  Please advised to call office at that time or if he has any difficulty tolerating

## 2020-09-29 NOTE — Telephone Encounter (Signed)
Called pt and he did not notice any difference (positive or negative) with taking the  1/2 tablet of sinemet po bid.  (25/100).  Please advise.

## 2020-09-29 NOTE — Telephone Encounter (Signed)
LMVM for pt that per JM/NP that can increase the carbidopa levodopa 1/2 tablet po TID, escribed to pharmacy.  Watch for SE, or call if questions concerns, or if not tolerating.

## 2020-09-29 NOTE — Telephone Encounter (Signed)
Pt called wanting to know if he is needing to schedule an appt to update provider on how he is doing on the carbidopa-levodopa (SINEMET) 25-100 MG tablet  Please advise.

## 2020-10-02 DIAGNOSIS — M5136 Other intervertebral disc degeneration, lumbar region: Secondary | ICD-10-CM | POA: Diagnosis not present

## 2020-10-02 DIAGNOSIS — M9903 Segmental and somatic dysfunction of lumbar region: Secondary | ICD-10-CM | POA: Diagnosis not present

## 2020-10-02 DIAGNOSIS — M9905 Segmental and somatic dysfunction of pelvic region: Secondary | ICD-10-CM | POA: Diagnosis not present

## 2020-10-02 DIAGNOSIS — M9904 Segmental and somatic dysfunction of sacral region: Secondary | ICD-10-CM | POA: Diagnosis not present

## 2020-10-08 DIAGNOSIS — M9903 Segmental and somatic dysfunction of lumbar region: Secondary | ICD-10-CM | POA: Diagnosis not present

## 2020-10-08 DIAGNOSIS — M5136 Other intervertebral disc degeneration, lumbar region: Secondary | ICD-10-CM | POA: Diagnosis not present

## 2020-10-08 DIAGNOSIS — M9904 Segmental and somatic dysfunction of sacral region: Secondary | ICD-10-CM | POA: Diagnosis not present

## 2020-10-08 DIAGNOSIS — M9905 Segmental and somatic dysfunction of pelvic region: Secondary | ICD-10-CM | POA: Diagnosis not present

## 2020-10-15 DIAGNOSIS — M9905 Segmental and somatic dysfunction of pelvic region: Secondary | ICD-10-CM | POA: Diagnosis not present

## 2020-10-15 DIAGNOSIS — M9903 Segmental and somatic dysfunction of lumbar region: Secondary | ICD-10-CM | POA: Diagnosis not present

## 2020-10-15 DIAGNOSIS — M9904 Segmental and somatic dysfunction of sacral region: Secondary | ICD-10-CM | POA: Diagnosis not present

## 2020-10-15 DIAGNOSIS — M5136 Other intervertebral disc degeneration, lumbar region: Secondary | ICD-10-CM | POA: Diagnosis not present

## 2020-10-22 DIAGNOSIS — M9903 Segmental and somatic dysfunction of lumbar region: Secondary | ICD-10-CM | POA: Diagnosis not present

## 2020-10-22 DIAGNOSIS — M5136 Other intervertebral disc degeneration, lumbar region: Secondary | ICD-10-CM | POA: Diagnosis not present

## 2020-10-22 DIAGNOSIS — M9905 Segmental and somatic dysfunction of pelvic region: Secondary | ICD-10-CM | POA: Diagnosis not present

## 2020-10-22 DIAGNOSIS — M9904 Segmental and somatic dysfunction of sacral region: Secondary | ICD-10-CM | POA: Diagnosis not present

## 2020-10-29 DIAGNOSIS — M9905 Segmental and somatic dysfunction of pelvic region: Secondary | ICD-10-CM | POA: Diagnosis not present

## 2020-10-29 DIAGNOSIS — M9903 Segmental and somatic dysfunction of lumbar region: Secondary | ICD-10-CM | POA: Diagnosis not present

## 2020-10-29 DIAGNOSIS — M5136 Other intervertebral disc degeneration, lumbar region: Secondary | ICD-10-CM | POA: Diagnosis not present

## 2020-10-29 DIAGNOSIS — M9904 Segmental and somatic dysfunction of sacral region: Secondary | ICD-10-CM | POA: Diagnosis not present

## 2020-10-29 LAB — CUP PACEART REMOTE DEVICE CHECK
Date Time Interrogation Session: 20211230081745
Implantable Lead Implant Date: 20170525
Implantable Lead Implant Date: 20170525
Implantable Lead Location: 753859
Implantable Lead Location: 753860
Implantable Lead Model: 377
Implantable Lead Model: 377
Implantable Lead Serial Number: 49470224
Implantable Lead Serial Number: 49485613
Implantable Pulse Generator Implant Date: 20170525
Pulse Gen Model: 394969
Pulse Gen Serial Number: 68798589

## 2020-10-30 ENCOUNTER — Ambulatory Visit (INDEPENDENT_AMBULATORY_CARE_PROVIDER_SITE_OTHER): Payer: Medicare HMO

## 2020-10-30 DIAGNOSIS — I495 Sick sinus syndrome: Secondary | ICD-10-CM

## 2020-11-04 DIAGNOSIS — M5136 Other intervertebral disc degeneration, lumbar region: Secondary | ICD-10-CM | POA: Diagnosis not present

## 2020-11-04 DIAGNOSIS — M9905 Segmental and somatic dysfunction of pelvic region: Secondary | ICD-10-CM | POA: Diagnosis not present

## 2020-11-04 DIAGNOSIS — M9904 Segmental and somatic dysfunction of sacral region: Secondary | ICD-10-CM | POA: Diagnosis not present

## 2020-11-04 DIAGNOSIS — M9903 Segmental and somatic dysfunction of lumbar region: Secondary | ICD-10-CM | POA: Diagnosis not present

## 2020-11-05 ENCOUNTER — Emergency Department (HOSPITAL_COMMUNITY): Payer: Medicare HMO

## 2020-11-05 ENCOUNTER — Emergency Department (HOSPITAL_COMMUNITY)
Admission: EM | Admit: 2020-11-05 | Discharge: 2020-11-07 | Disposition: A | Discharge status: Home / Self Care | Payer: Medicare HMO | Attending: Emergency Medicine | Admitting: Emergency Medicine

## 2020-11-05 DIAGNOSIS — G9389 Other specified disorders of brain: Secondary | ICD-10-CM | POA: Diagnosis not present

## 2020-11-05 DIAGNOSIS — I10 Essential (primary) hypertension: Secondary | ICD-10-CM | POA: Diagnosis not present

## 2020-11-05 DIAGNOSIS — Z79899 Other long term (current) drug therapy: Secondary | ICD-10-CM | POA: Diagnosis not present

## 2020-11-05 DIAGNOSIS — G319 Degenerative disease of nervous system, unspecified: Secondary | ICD-10-CM | POA: Diagnosis not present

## 2020-11-05 DIAGNOSIS — W19XXXA Unspecified fall, initial encounter: Secondary | ICD-10-CM | POA: Diagnosis not present

## 2020-11-05 DIAGNOSIS — T148XXA Other injury of unspecified body region, initial encounter: Secondary | ICD-10-CM

## 2020-11-05 DIAGNOSIS — E1129 Type 2 diabetes mellitus with other diabetic kidney complication: Secondary | ICD-10-CM | POA: Diagnosis not present

## 2020-11-05 DIAGNOSIS — E1169 Type 2 diabetes mellitus with other specified complication: Secondary | ICD-10-CM | POA: Diagnosis not present

## 2020-11-05 DIAGNOSIS — R0902 Hypoxemia: Secondary | ICD-10-CM | POA: Diagnosis not present

## 2020-11-05 DIAGNOSIS — I2510 Atherosclerotic heart disease of native coronary artery without angina pectoris: Secondary | ICD-10-CM | POA: Diagnosis not present

## 2020-11-05 DIAGNOSIS — W01198A Fall on same level from slipping, tripping and stumbling with subsequent striking against other object, initial encounter: Secondary | ICD-10-CM | POA: Insufficient documentation

## 2020-11-05 DIAGNOSIS — S0081XA Abrasion of other part of head, initial encounter: Secondary | ICD-10-CM | POA: Diagnosis not present

## 2020-11-05 DIAGNOSIS — M25561 Pain in right knee: Secondary | ICD-10-CM | POA: Diagnosis not present

## 2020-11-05 DIAGNOSIS — N189 Chronic kidney disease, unspecified: Secondary | ICD-10-CM | POA: Diagnosis not present

## 2020-11-05 DIAGNOSIS — I2583 Coronary atherosclerosis due to lipid rich plaque: Secondary | ICD-10-CM | POA: Diagnosis not present

## 2020-11-05 DIAGNOSIS — R52 Pain, unspecified: Secondary | ICD-10-CM | POA: Diagnosis not present

## 2020-11-05 DIAGNOSIS — Z950 Presence of cardiac pacemaker: Secondary | ICD-10-CM | POA: Diagnosis not present

## 2020-11-05 DIAGNOSIS — S0990XA Unspecified injury of head, initial encounter: Secondary | ICD-10-CM | POA: Diagnosis not present

## 2020-11-05 DIAGNOSIS — Z87891 Personal history of nicotine dependence: Secondary | ICD-10-CM | POA: Insufficient documentation

## 2020-11-05 DIAGNOSIS — I129 Hypertensive chronic kidney disease with stage 1 through stage 4 chronic kidney disease, or unspecified chronic kidney disease: Secondary | ICD-10-CM | POA: Diagnosis not present

## 2020-11-05 DIAGNOSIS — Z7984 Long term (current) use of oral hypoglycemic drugs: Secondary | ICD-10-CM | POA: Diagnosis not present

## 2020-11-05 DIAGNOSIS — I6389 Other cerebral infarction: Secondary | ICD-10-CM | POA: Diagnosis not present

## 2020-11-05 NOTE — ED Triage Notes (Signed)
Lost balance and fall while walking outside. Denies  LOC, on plavix. Reported landed on hands and knees first then scrape head.

## 2020-11-06 NOTE — ED Provider Notes (Signed)
[facility] EMERGENCY DEPARTMENT Provider Note   CSN: [id number] Arrival date & time: 11/05/20  1752     History Chief Complaint  Patient presents with  . Fall    Jordan [name] is a 80 y.o. male.  Mechanical fall while walking downhill. No Loc. Did hit head. On plavix. No extremity pain.  The history is provided by the patient.  Fall This is a new problem. Episode onset: 15 hours ago. The problem occurs rarely. The problem has been resolved. Pertinent negatives include no chest pain, no abdominal pain, no headaches and no shortness of breath. Nothing aggravates the symptoms. Nothing relieves the symptoms. He has tried nothing for the symptoms.       Past Medical History:  Diagnosis Date  . Arthritis    R shoulder, bone spur  . Arthritis    "hands" (03/25/2016)  . Chronic lower back pain   . Coronary heart disease    Dr Pernell Dupre  . H/O cardiovascular stress test    perhaps last one was 2009  . Heart murmur    12/29/11 echo: mild MR, no AS, trivial TR  . History of gout   . Hyperlipidemia   . Hypertension    saw Dr. Linard Millers last earl;y- 2014, cardiac cath. last done ?2009, blocks seen didn't require any intervention at that point.  . 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 OSA on CPAP    NPSG 12-13-98 AHI 22.7  . PONV (postoperative nausea and vomiting)   . Presence of permanent cardiac pacemaker   . Shortness of breath    with exertion   . Stroke ([place])   . Type II diabetes mellitus [facility])     Patient Active Problem List   Diagnosis Date Noted  . Sinusitis, acute maxillary 06/24/2020  . AKI (acute kidney injury) ([place])   . Labile blood glucose   . Cough   . Benign essential HTN   . Skin lesion of hand   . Hypoalbuminemia due to protein-calorie malnutrition ([place])   . Controlled type 2 diabetes mellitus with hyperglycemia, without long-term current use of insulin ([place])   . 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 Morbid  obesity ([place])   . Acute blood loss anemia   . Acute thalamic infarction ([place]) 04/15/2020  . 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 TIA (transient ischemic attack) 02/17/2018  . DM2 (diabetes mellitus, type 2) ([place]) 02/17/2018  . Insomnia 02/01/2018  . 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 Dyspnea on exertion 03/03/2014  . HLD (hyperlipidemia) 12/13/2007  . Essential hypertension 12/13/2007  . OBESITY 12/05/2007  . OSA on CPAP 12/05/2007  . 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 Coronary atherosclerosis 12/05/2007    Past Surgical History:  Procedure Laterality Date  . CARDIAC CATHETERIZATION  2009?  . EP IMPLANTABLE DEVICE N/A 03/25/2016   Procedure: Pacemaker Implant - Dual Chamber;  Surgeon: Evans Lance, MD;  Location: [facility];  Service: Cardiovascular;  Laterality: N/A;  . FRACTURE SURGERY    . INSERT / REPLACE / REMOVE PACEMAKER  03/24/2016  . LEFT AND RIGHT HEART CATHETERIZATION WITH CORONARY ANGIOGRAM N/A 06/05/2014   Procedure: LEFT AND RIGHT HEART CATHETERIZATION WITH CORONARY ANGIOGRAM;  Surgeon: Sinclair Grooms, MD;  Location: [facility] CATH LAB;  Service: Cardiovascular;  Laterality: N/A;  . NASAL FRACTURE SURGERY    . SHOULDER ARTHROSCOPY WITH ROTATOR  CUFF REPAIR AND SUBACROMIAL DECOMPRESSION Right 08/16/2013   Procedure: SHOULDER ARTHROSCOPY WITH ROTATOR CUFF REPAIR AND SUBACROMIAL DECOMPRESSION;  Surgeon: Mable Paris, MD;  Location: [facility] OR;  Service: Orthopedics;  Laterality: Right;  Right shoulder arthroscopy rotator cuff repair, subacromial decompression.  . TONSILLECTOMY         Family History  Problem Relation Age of Onset  . Sleep apnea Sister   . Colon cancer Sister     Social History   Tobacco Use  . Smoking status: Former Smoker    Packs/day: 2.50    Years: 6.00    Pack years: 15.00    Types: Cigarettes    Quit date: 06/05/1967    Years  since quitting: 53.4  . Smokeless tobacco: Never Used  Vaping Use  . Vaping Use: Never used  Substance Use Topics  . Alcohol use: Yes    Comment: rare  . Drug use: No    Home Medications Prior to Admission medications   Medication Sig Start Date End Date Taking? Authorizing Provider  amphetamine-dextroamphetamine (ADDERALL) 10 MG tablet Take 10 mg by mouth daily with breakfast.    [provider]  atorvastatin (LIPITOR) 40 MG tablet Take 40 mg by mouth daily. 05/03/20   [provider]  carbidopa-levodopa (SINEMET) 25-100 MG tablet Take 0.5 tablets by mouth 3 (three) times daily. 09/29/20   Ihor Austin, NP  clopidogrel (PLAVIX) 75 MG tablet Take 1 tablet (75 mg total) by mouth daily. 04/29/20   Love, Evlyn Kanner, PA-C  Coenzyme Q10 (CO Q-10 PO) Take 1 capsule by mouth in the morning and at bedtime.    [provider]  fluticasone (FLONASE) 50 MCG/ACT nasal spray Place 1 spray into both nostrils daily. 06/24/20   Coral Ceo, NP  lisinopril (ZESTRIL) 10 MG tablet Take 10 mg by mouth daily. 03/26/20   [provider]  metFORMIN (GLUCOPHAGE-XR) 500 MG 24 hr tablet Take 4 tablets (2,000 mg total) by mouth daily with breakfast. 04/29/20   Love, Evlyn Kanner, PA-C  Multiple Vitamin (MULTIVITAMIN WITH MINERALS) TABS tablet Take 1 tablet by mouth daily. 02/19/18   Alwyn Ren, MD  NITROSTAT 0.4 MG SL tablet PLACE 1 TABLET (0.4 MG TOTAL) UNDER THE TONGUE EVERY 5 (FIVE) MINUTES AS NEEDED FOR CHEST PAIN. Patient taking differently: Place 0.4 mg under the tongue every 5 (five) minutes as needed for chest pain.  03/31/16   Lyn Records, MD  Omega-3 Fatty Acids (OMEGA-3 PO) Take 1 capsule by mouth 3 (three) times daily.     [provider]  PRESCRIPTION MEDICATION See admin instructions. CPAP- At bedtime    [provider]    Allergies    Patient has no known allergies.  Review of Systems   Review of Systems  Constitutional: Negative for  chills and fever.  HENT: Negative for ear pain and sore throat.   Eyes: Negative for pain and visual disturbance.  Respiratory: Negative for cough and shortness of breath.   Cardiovascular: Negative for chest pain and palpitations.  Gastrointestinal: Negative for abdominal pain and vomiting.  Genitourinary: Negative for dysuria and hematuria.  Musculoskeletal: Negative for arthralgias and back pain.  Skin: Positive for wound. Negative for color change and rash.  Neurological: Negative for dizziness, seizures, syncope, facial asymmetry, speech difficulty, weakness, light-headedness, numbness and headaches.  All other systems reviewed and are negative.   Physical Exam Updated Vital Signs  ED Triage Vitals [11/05/20 1750]  Enc Vitals Group  BP (!) 144/77     Pulse Rate 77     Resp 17     Temp 98.3 F (36.8 C)     Temp Source Oral     SpO2 94 %     Weight      Height      Head Circumference      Peak Flow      Pain Score      Pain Loc      Pain Edu?      Excl. in [place]?     Physical Exam Vitals and nursing note reviewed.  Constitutional:      Appearance: He is well-developed and well-nourished.  HENT:     Head: Normocephalic.     Nose: Nose normal.     Mouth/Throat:     Mouth: Mucous membranes are moist.  Eyes:     Extraocular Movements: Extraocular movements intact.     Conjunctiva/sclera: Conjunctivae normal.     Pupils: Pupils are equal, round, and reactive to light.  Cardiovascular:     Rate and Rhythm: Normal rate and regular rhythm.     Heart sounds: No murmur heard.   Pulmonary:     Effort: Pulmonary effort is normal. No respiratory distress.     Breath sounds: Normal breath sounds.  Abdominal:     General: Abdomen is flat.     Palpations: Abdomen is soft.     Tenderness: There is no abdominal tenderness.  Musculoskeletal:        General: No tenderness or edema.     Cervical back: Normal range of motion and neck supple.  Skin:    General: Skin is  warm and dry.     Comments: Abrasion to the forehead that is hemostatic with no laceration  Neurological:     General: No focal deficit present.     Mental Status: He is alert and oriented to person, place, and time.     Cranial Nerves: No cranial nerve deficit.     Sensory: No sensory deficit.     Motor: No weakness.     Coordination: Coordination normal.     Comments: Grossly normal strength and sensation  Psychiatric:        Mood and Affect: Mood and affect normal.     ED Results / Procedures / Treatments   Labs (all labs ordered are listed, but only abnormal results are displayed) Labs Reviewed - No data to display  EKG None  Radiology CT Head Wo Contrast  Result Date: 11/05/2020 CLINICAL DATA:  Fall.  Head injury EXAM: CT HEAD WITHOUT CONTRAST TECHNIQUE: Contiguous axial images were obtained from the base of the skull through the vertex without intravenous contrast. COMPARISON:  CT head 06/21/2020 FINDINGS: Brain: Generalized atrophy. Negative for hydrocephalus. Chronic infarcts in the thalamus bilaterally. White matter hypodensity bilaterally unchanged. Hypodensity left anterior frontal lobe unchanged. Negative for acute infarct, hemorrhage, mass Vascular: Vessels are diffusely hyperdense without asymmetry. Atherosclerotic calcification internal carotid artery bilaterally and distal vertebral artery bilaterally. Skull: Negative Sinuses/Orbits: Mucosal edema left maxillary ethmoid and frontal sinus. Bilateral cataract extraction. Other: None IMPRESSION: Atrophy and chronic ischemic change. No acute abnormality and no change since the prior CT. Electronically Signed   By: Franchot Gallo M.D.   On: 11/05/2020 19:07    Procedures Procedures (including critical care time)  Medications Ordered in ED Medications - No data to display  ED Course  I have reviewed the triage vital signs and the nursing notes.  Pertinent labs & imaging results that were available during my care of the  patient were reviewed by me and considered in my medical decision making (see chart for details).    MDM Rules/Calculators/A&P                          Jordan [name] is an 80 year old male with history of diabetes, stroke who presents the ED after fall.  Patient on Plavix but no other blood thinners.  Mechanical fall.  Was walking downhill and lost his balance and scraped his forehead.  Has an abrasion to his forehead but no laceration.  CT scan shows no head bleed.  Not have any neck pain, back pain, extremity pain.  Patient neurologically intact.  Did not have any presyncopal or syncopal event and overall truly feel that patient had mechanical fall due to some chronic balance issues.  Recommend that he consider using his walker over the next several days.  Discussed may be concussion symptoms with him as well.  Recommend follow-up with primary care doctor and discharged in ED in good condition.  This chart was dictated using voice recognition software.  Despite best efforts to proofread,  errors can occur which can change the documentation meaning.     Final Clinical Impression(s) / ED Diagnoses Final diagnoses:  Fall, initial encounter  Minor head injury, initial encounter  Abrasion    Rx / DC Orders ED Discharge Orders    None       Lennice Sites, DO 11/06/20 1523

## 2020-11-06 NOTE — Discharge Instructions (Addendum)
There was no head bleed in your CT scan today.  It is possible that you might have a mild concussion and you may develop some daily headaches over the next several days.  Recommend Tylenol for any pain.  Recommend rest and hydration.  It is okay to resume daily activities as long as you feel well enough to do so.  Follow-up with your primary care doctor if he continued to have bad headaches or other symptoms.  Please return the emergency department if you have any other concerns as well.  Use soap and water to abrasion to your forehead and may consider some antibiotic ointment such as Neosporin.

## 2020-11-10 DIAGNOSIS — M9904 Segmental and somatic dysfunction of sacral region: Secondary | ICD-10-CM | POA: Diagnosis not present

## 2020-11-10 DIAGNOSIS — M9903 Segmental and somatic dysfunction of lumbar region: Secondary | ICD-10-CM | POA: Diagnosis not present

## 2020-11-10 DIAGNOSIS — M5136 Other intervertebral disc degeneration, lumbar region: Secondary | ICD-10-CM | POA: Diagnosis not present

## 2020-11-10 DIAGNOSIS — M9905 Segmental and somatic dysfunction of pelvic region: Secondary | ICD-10-CM | POA: Diagnosis not present

## 2020-11-14 DIAGNOSIS — I679 Cerebrovascular disease, unspecified: Secondary | ICD-10-CM | POA: Diagnosis not present

## 2020-11-14 DIAGNOSIS — G47419 Narcolepsy without cataplexy: Secondary | ICD-10-CM | POA: Diagnosis not present

## 2020-11-14 DIAGNOSIS — I2510 Atherosclerotic heart disease of native coronary artery without angina pectoris: Secondary | ICD-10-CM | POA: Diagnosis not present

## 2020-11-14 DIAGNOSIS — I10 Essential (primary) hypertension: Secondary | ICD-10-CM | POA: Diagnosis not present

## 2020-11-14 DIAGNOSIS — L309 Dermatitis, unspecified: Secondary | ICD-10-CM | POA: Diagnosis not present

## 2020-11-14 DIAGNOSIS — E119 Type 2 diabetes mellitus without complications: Secondary | ICD-10-CM | POA: Diagnosis not present

## 2020-11-14 DIAGNOSIS — E7800 Pure hypercholesterolemia, unspecified: Secondary | ICD-10-CM | POA: Diagnosis not present

## 2020-11-14 DIAGNOSIS — R972 Elevated prostate specific antigen [PSA]: Secondary | ICD-10-CM | POA: Diagnosis not present

## 2020-11-14 NOTE — Progress Notes (Signed)
Remote pacemaker transmission.   

## 2020-11-27 ENCOUNTER — Encounter: Payer: Self-pay | Admitting: Adult Health

## 2020-11-27 ENCOUNTER — Ambulatory Visit: Payer: Medicare HMO | Admitting: Adult Health

## 2020-11-27 VITALS — BP 150/86 | HR 76 | Ht 67.0 in | Wt 225.0 lb

## 2020-11-27 DIAGNOSIS — I639 Cerebral infarction, unspecified: Secondary | ICD-10-CM | POA: Diagnosis not present

## 2020-11-27 DIAGNOSIS — M545 Low back pain, unspecified: Secondary | ICD-10-CM

## 2020-11-27 DIAGNOSIS — I6381 Other cerebral infarction due to occlusion or stenosis of small artery: Secondary | ICD-10-CM

## 2020-11-27 DIAGNOSIS — G8929 Other chronic pain: Secondary | ICD-10-CM | POA: Diagnosis not present

## 2020-11-27 DIAGNOSIS — G214 Vascular parkinsonism: Secondary | ICD-10-CM | POA: Diagnosis not present

## 2020-11-27 DIAGNOSIS — R2689 Other abnormalities of gait and mobility: Secondary | ICD-10-CM

## 2020-11-27 NOTE — Progress Notes (Signed)
[facility] [address] [place]. [place] [address] [phone]       STROKE FOLLOW UP NOTE  [name] Date of Birth:  04-14-1941 Medical Record Number:  [id number]   Reason for Referral: stroke follow up    SUBJECTIVE:   CHIEF COMPLAINT:  Chief Complaint  Patient presents with  . Follow-up    Rm 14 alone Pt states his back hurts, needs to know what to do to get 100% better     HPI:   Today, 11/27/2020, [name] returns per patient request for sooner scheduled visit due to continued gait impairment and lower back pain.  He is currently unaccompanied.  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  Tremors have been stable without worsening.  He does feel as though his ambulation is worse with increased back pain. He did have a mechanical fall seen in ED on 11/05/2020 thankfully without injury.   He does have chronic back pain previously doing routine exercise prior to his stroke but has not returned back to routine exercise since. He admits to sedentary life style with limited physical activity. He will fall asleep easily during the day watching TV and occasional issues with insomnia. 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  No further concerns at this time.   History provided for reference purposes only Update 09/15/2020 JM: [name] returns for stroke follow-up accompanied by his wife.  He has been doing well since prior visit with residual decreased right hand dexterity and decreased sensory right fingertips.  Denies new or worsening stroke/TIA symptoms. He does complain of short-term memory loss such as forgetting an actors name or random fact.  He also continues to experience R>L upper extremity tremors and gait impairment with imbalance with fall in 06/2020 resulting in nasal bone fracture.  Wife reports shuffling type gait when  fatigued.  Remains on Plavix and atorvastatin for secondary stroke prevention without side effects.  Blood pressure today 126/73.  Glucose level stable per patient.  Reports ongoing compliance with CPAP for OSA management managed by pulmonology Dr. Annamaria Boots.  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  He complains of excessive daytime fatigue, vivid dreams and occasional insomnia. Per patient, he has not trialed any other medication for narcolepsy.  No further concerns at this time.  Update 05/15/2020 JM: [name] is being seen for hospital follow-up accompanied by his wife.  He was discharged home from CIR on 04/29/2020 after 11 day stay.  Residual deficits of mild imbalance and mildly decreased sensation R finger tips and decreased right hand fine motor skills. Does report ongoing improvement currently working with home health PT. he has had gait impairment ongoing and right hand decreased fine motor control with tremors and was evaluated by Dr. Rexene Alberts on 12/11/2019 for possible Parkinson's but was not felt this was Parkinson's related.  He does continue to use cane for ambulation and denies any recent falls.  He is questioning return to driving as he was advised no driving at discharge until further cleared.  Completed 3 weeks DAPT and continues on Plavix alone without bleeding or bruising.  Continues on atorvastatin 40 mg daily without myalgias.  Blood pressure today 118/84.  Glucose levels stable.  Endorses ongoing use of CPAP for OSA management.  No further concerns at this time.   Stroke admission 04/14/2020  [name] is a 80 y.o. male with history of diabetes, stroke, hypertension, hyperlipidemia, permanent cardiac pacemaker  who presented on 04/14/2020 with R sided decreased sensation.  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  Previously on clopidogrel and recommended DAPT for 3 weeks then clopidogrel alone.   Presented in hypertensive urgency with BP 204/116 and recommended long-term BP goal normotensive range.  LDL 57 and recommended continuation of atorvastatin 40 mg daily.  Uncontrolled DM with A1c 7.9.  Other stroke risk factors include prior stroke 01/2018 with multifocal punctate infarcts concerning for embolic pattern, former tobacco use, EtOH use, obesity, CAD, OSA on CPAP and permanent cardiac pacemaker.  Evaluated by therapies who recommended CIR for residual deficits of imbalance and safety affecting ADLs and mobility.  Discharged to CIR on 04/18/2020.  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 Small vessel disease. Sinus dz. ASPECTS 10.     CTA head & neck no LVO. Aortic atherosclerosis.   MRI  L thalamic infarct. Small vessel disease. Atrophy.   2D Echo EF 65-70%. No source of embolus. LA mildly dilated.   Pacemaker interrogation no afib  LDL 57 -continued atorvastatin 40 mg daily  HgbA1c 7.9  Lovenox 40 mg sq daily for VTE prophylaxis  clopidogrel 75 mg daily prior to admission, now on aspirin 81 mg daily and clopidogrel 75 mg daily. Continue DAPT x 3 weeks then plavix alone    Therapy recommendations:  CIR  Disposition:  CIR      ROS:   14 system review of systems performed and negative with exception of those listed in HPI  PMH:  Past Medical History:  Diagnosis Date  . Arthritis    R shoulder, bone spur  . Arthritis    "hands" (03/25/2016)  . Chronic lower back pain   . Coronary heart disease    Dr Pernell Dupre  . H/O cardiovascular stress test    perhaps last one was 2009  . Heart murmur    12/29/11 echo: mild MR, no AS, trivial TR  . History of gout   . Hyperlipidemia   . Hypertension    saw Dr. Linard Millers last earl;y- 2014, cardiac cath. last done ?2009, blocks seen didn't require any intervention at that point.  . Narcolepsy    [name] 04-11-97; Mean Latency 1.52min, SOREM 2  . OSA on CPAP     NPSG 12-13-98 AHI 22.7  . PONV (postoperative nausea and vomiting)   . Presence of permanent cardiac pacemaker   . Shortness of breath    with exertion   . Stroke ([facility])   . Type II diabetes mellitus (HCC)     PSH:  Past Surgical History:  Procedure Laterality Date  . CARDIAC CATHETERIZATION  2009?  . EP IMPLANTABLE DEVICE N/A 03/25/2016   Procedure: Pacemaker Implant - Dual Chamber;  Surgeon: Evans Lance, MD;  Location: [facility];  Service: Cardiovascular;  Laterality: N/A;  . FRACTURE SURGERY    . INSERT / REPLACE / REMOVE PACEMAKER  03/24/2016  . LEFT AND RIGHT HEART CATHETERIZATION WITH CORONARY ANGIOGRAM N/A 06/05/2014   Procedure: LEFT AND RIGHT HEART CATHETERIZATION WITH CORONARY ANGIOGRAM;  Surgeon: Sinclair Grooms, MD;  Location: [facility] CATH LAB;  Service: Cardiovascular;  Laterality: N/A;  . NASAL FRACTURE SURGERY    . SHOULDER ARTHROSCOPY WITH ROTATOR CUFF REPAIR AND SUBACROMIAL DECOMPRESSION Right 08/16/2013  Procedure: SHOULDER ARTHROSCOPY WITH ROTATOR CUFF REPAIR AND SUBACROMIAL DECOMPRESSION;  Surgeon: [name], MD;  Location: [facility];  Service: Orthopedics;  Laterality: Right;  Right shoulder arthroscopy rotator cuff repair, subacromial decompression.  . TONSILLECTOMY      Social History:  Social History   Socioeconomic History  . Marital status: Married    Spouse name: Not on file  . Number of children: Not on file  . Years of education: Not on file  . Highest education level: Not on file  Occupational History  . Occupation: Chiropodist  . Occupation: [place] [employer]  Tobacco Use  . Smoking status: Former Smoker    Packs/day: 2.50    Years: 6.00    Pack years: 15.00    Types: Cigarettes    Quit date: 06/05/1967    Years since quitting: 53.5  . Smokeless tobacco: Never Used  Vaping Use  . Vaping Use: Never used  Substance and Sexual Activity  . Alcohol use: Yes    Comment: rare  . Drug use: No  . Sexual activity: Not  Currently  Other Topics Concern  . Not on file  Social History Narrative  . Not on file   Social Determinants of Health   Financial Resource Strain: Not on file  Food Insecurity: Not on file  Transportation Needs: Not on file  Physical Activity: Not on file  Stress: Not on file  Social Connections: Not on file  Intimate Partner Violence: Not on file    Family History:  Family History  Problem Relation Age of Onset  . Sleep apnea Sister   . Colon cancer Sister     Medications:   Current Outpatient Medications on File Prior to Visit  Medication Sig Dispense Refill  . amphetamine-dextroamphetamine (ADDERALL) 10 MG tablet Take 10 mg by mouth 2 (two) times daily.    [name] atorvastatin (LIPITOR) 40 MG tablet Take 40 mg by mouth 2 (two) times daily at 10 AM and 5 PM. Pt taking Half tablet twice daily    . carbidopa-levodopa (SINEMET) 25-100 MG tablet Take 0.5 tablets by mouth 3 (three) times daily. 45 tablet 4  . clopidogrel (PLAVIX) 75 MG tablet Take 1 tablet (75 mg total) by mouth daily. 30 tablet 1  . Coenzyme Q10 (CO Q-10 PO) Take 1 capsule by mouth in the morning and at bedtime.    . fluticasone (FLONASE) 50 MCG/ACT nasal spray Place 1 spray into both nostrils daily. 16 g 3  . metFORMIN (GLUCOPHAGE-XR) 500 MG 24 hr tablet Take 4 tablets (2,000 mg total) by mouth daily with breakfast. 120 tablet 0  . Multiple Vitamin (MULTIVITAMIN WITH MINERALS) TABS tablet Take 1 tablet by mouth daily.    [name] NITROSTAT 0.4 MG SL tablet PLACE 1 TABLET (0.4 MG TOTAL) UNDER THE TONGUE EVERY 5 (FIVE) MINUTES AS NEEDED FOR CHEST PAIN. (Patient taking differently: Place 0.4 mg under the tongue every 5 (five) minutes as needed for chest pain.) 25 tablet 5  . Omega-3 Fatty Acids (OMEGA-3 PO) Take 1 capsule by mouth 3 (three) times daily.     [name] PRESCRIPTION MEDICATION See admin instructions. CPAP- At bedtime     No current facility-administered medications on file prior to visit.    Allergies:  No Known  Allergies    OBJECTIVE:  Physical Exam  Vitals:   11/27/20 1547  BP: (!) 150/86  Pulse: 76  Weight: 225 lb (102.1 kg)  Height: 5\' 7"  (1.702 m)   Body mass index is 35.24  kg/m. No exam data present  General: well developed, well nourished, very pleasant elderly Caucasian male, seated, in no evident distress Head: head normocephalic and atraumatic.   Neck: supple with no carotid or supraclavicular bruits Cardiovascular: regular rate and rhythm, no murmurs Musculoskeletal: no deformity Skin:  no rash/petichiae Vascular:  Normal pulses all extremities   Neurologic Exam Mental Status: Awake and fully alert. Fluent speech and language. Oriented to place and time. Recent and remote memory intact. Attention span, concentration and fund of knowledge appropriate. Mood and affect appropriate.  Cranial Nerves: Pupils equal, briskly reactive to light. Extraocular movements full without nystagmus. Visual fields full to confrontation. Hearing intact. Facial sensation intact. Face, tongue, palate moves normally and symmetrically.  Motor: Normal bulk and tone. Normal strength in all tested extremity muscles. Sensory.: intact to touch , pinprick , position and vibratory sensation.  Coordination: Rapid alternating movements normal in all extremities except slightly decreased right hand. Finger-to-nose and heel-to-shin performed accurately bilaterally.  Slight decreased right hand fine motor control.  Mild and occasional right hand resting tremor.  B/l R>L UE postural tremor with mild R cogwheel rigidity. Mild R>L bradykinesia.   Gait and Station: Arises from chair with mild difficulty. Stance is slightly hunched. Gait demonstrates broad-based gait with decreased stride length and mild imbalance and postural instability.  Currently ambulating without assistive device Reflexes: 1+ and symmetric. Toes downgoing.        ASSESSMENT/PLAN: Jordan [name] is a 80 y.o. 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 Vascular risk factors include HTN, HLD, DM, prior stroke 01/2018, EtOH use, obesity, CAD, OSA on CPAP and pacer.     1. 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 Rexene Alberts.  Some benefit in regards to gait and movement with Sinemet half tab 3 times daily.  Recommend continuation at this time as well as potential benefit working with PT.  Discussed importance of routine physical activity and exercise, working with PT, managing stroke risk factors and avoiding future strokes to prevent worsening 2. Low back pain: chronic.  Denies radiculopathy or any other associated symptoms.  Currently interfering with ambulation. Xray lumbar spine 08/09/2019 severe degenerative disc disease.  Referral placed to PT 3. Left thalamic stroke:  a. Continue clopidogrel 75 mg daily  and atorvastatin 40 mg daily for secondary stroke prevention. b. Discussed secondary stroke prevention measures and importance of close PCP follow-up for aggressive risk factor management including HTN, HLD and DM  4. OSA on CPAP: Currently monitored by pulmonology Dr. Annamaria Boots with recent compliance report optimal management 5. Narcolepsy: Managed by Dr. Annamaria Boots with intermittent use of Adderall which could be contributing to his daytime fatigue, vivid dreams and occasional insomnia.  Continue to follow with Dr. Annamaria Boots    Follow up in 3 months or call earlier if needed   CC:  [place] provider: Dr. Lawanda Cousins, Gwyndolyn Saxon, MD    I spent 30 minutes of face-to-face and non-face-to-face time with patient.  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 Any transcriptional errors that result from this process are unintentional.

## 2020-11-27 NOTE — Patient Instructions (Addendum)
Your Plan:  Recommend trial of PT for low back pain and gait disorder  You will be called to schedule visit  Continue Sinemet 0.5 tabs 3 times daily - take around 8am, 12pm and 4pm     Follow up in 3 months or call earlier if needed     Thank you for coming to see [place] at [facility]. I hope we have been able to provide you high quality care today.  You may receive a patient satisfaction survey over the next few weeks. We would appreciate your feedback and comments so that we may continue to improve ourselves and the health of our patients.

## 2020-12-01 NOTE — Progress Notes (Signed)
I agree with the above plan 

## 2020-12-24 NOTE — Progress Notes (Signed)
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 VN Vet NPSG 12/13/98- AHI 22.7/hr Office Spirometry 01/31/14-  Within normal limits-FVC 3.09/77%, FEV1 2.45/80% , FEV1/FVC 0.79, FEF 25-75% 2.45/87%.  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 VN Vet CPAP 9/ [facility] Adderall 10 mg,  -----OSA on CPAP 9, DME: [facility]; no complaints, did not bring DL card for appt today Body weight today 234 lbs Doing well with CPAP- can't sleep without it.  Uses adderall ordinarily only 1-2/ dya, but asks script still allow # 90 for fewer refills. Fell, hitting sternum on blunt arm of chair. Bruise but no other injury.  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   CPAP 9/ [facility] Adderall 10 mg,  Download-  Not available Body weight today-226 lbs                           Son with high-functioning autism Covid vax-2 Moderna Flu vax-had -----Still compliant nightly with CPAP Occasionally mouth too dry from CPAP. Discussed humidifier.  Had stroke last year>R hand.  Also has fallen.    ROS-see HPI    + = positive Constitutional:   No-   weight loss, night sweats, fevers, chills, fatigue, lassitude. HEENT:   No-  headaches, difficulty swallowing, tooth/dental problems, sore throat,       No-  sneezing, itching, ear ache, nasal congestion, post nasal drip,  CV:  No-   chest pain, orthopnea, PND, swelling in lower extremities, anasarca, dizziness, palpitations Resp: + shortness of breath with exertion or at rest.              No-   productive cough,  No non-productive cough,  No- coughing up of blood.              No-   change in color of mucus.  No- wheezing.   Skin: No-   rash or lesions. GI:  No-    heartburn, indigestion, abdominal pain, nausea, vomiting,  GU:  MS:  . Neuro-     ?'s stamina, memory, balance Psych:  No- change in mood or affect. No depression or anxiety.  No memory loss.  OBJ- Physical Exam General- Alert, Oriented, Affect-appropriate, Distress- none acute.+ overweight Skin- rash-none, lesions- none, excoriation- none Lymphadenopathy- none Head- atraumatic            Eyes- Gross vision intact, PERRLA, conjunctivae and secretions clear            Ears- Hearing, canals-normal            Nose- Clear, no-Septal dev, mucus, polyps, erosion, perforation             Throat- Mallampati III , mucosa clear , drainage- none, tonsils- atrophic Neck- flexible , trachea midline, no stridor , thyroid nl, carotid no bruit Chest - symmetrical excursion , unlabored           Heart/CV- RRR , no murmur , no gallop  , no rub, nl s1 s2                           - JVD- none , edema- none, stasis changes- none, varices- none           Lung- clear to P&A, wheeze- none, cough-  none , dullness-none, rub- none           Chest wall-  + pacemaker,  no rub, pacer pocket not involved. Abd- Br/ Gen/ Rectal- Not done, not indicated Extrem- cyanosis- none, clubbing, none, atrophy- none, strength- nl Neuro- grossly intact to observation, no tremor, + speech clear

## 2020-12-25 ENCOUNTER — Other Ambulatory Visit: Payer: Self-pay

## 2020-12-25 ENCOUNTER — Ambulatory Visit: Payer: Medicare HMO | Admitting: Internal Medicine

## 2020-12-25 ENCOUNTER — Encounter: Payer: Self-pay | Admitting: Internal Medicine

## 2020-12-25 DIAGNOSIS — G4733 Obstructive sleep apnea (adult) (pediatric): Secondary | ICD-10-CM

## 2020-12-25 DIAGNOSIS — G47419 Narcolepsy without cataplexy: Secondary | ICD-10-CM | POA: Diagnosis not present

## 2020-12-25 DIAGNOSIS — Z9989 Dependence on other enabling machines and devices: Secondary | ICD-10-CM

## 2020-12-25 NOTE — Patient Instructions (Signed)
We can continue CPAP 9  Please call if we can help.

## 2020-12-27 NOTE — Assessment & Plan Note (Signed)
Benefits from CPAP and compliant Plan- continue 9 cwp. [employer] to get download.

## 2020-12-27 NOTE — Assessment & Plan Note (Signed)
Still uses Adderall once daily. Retired and schedule is flexible. Naps as needed. Plan- continue Adderall- skip when not needed

## 2020-12-30 ENCOUNTER — Telehealth: Payer: Self-pay | Admitting: Internal Medicine

## 2020-12-30 DIAGNOSIS — M4306 Spondylolysis, lumbar region: Secondary | ICD-10-CM | POA: Diagnosis not present

## 2020-12-30 DIAGNOSIS — G20 Parkinson's disease: Secondary | ICD-10-CM | POA: Diagnosis not present

## 2020-12-30 DIAGNOSIS — M461 Sacroiliitis, not elsewhere classified: Secondary | ICD-10-CM | POA: Diagnosis not present

## 2020-12-30 NOTE — Telephone Encounter (Signed)
Pt was seen by Dr. Annamaria Boots on 2/24 brought his med card/list. Pt left the list at the office. Pt was worked up by [employer]. Overton Mam is not in office this week. Will forward to [facility] to see if pt's med card is with Dr. Janee Morn look-ats/desk.

## 2020-12-30 NOTE — Telephone Encounter (Signed)
Called and spoke with patient who states that he found his index cards late this morning. Nothing further needed at this time.

## 2021-01-21 DIAGNOSIS — M5136 Other intervertebral disc degeneration, lumbar region: Secondary | ICD-10-CM | POA: Diagnosis not present

## 2021-01-21 DIAGNOSIS — M9905 Segmental and somatic dysfunction of pelvic region: Secondary | ICD-10-CM | POA: Diagnosis not present

## 2021-01-21 DIAGNOSIS — M9904 Segmental and somatic dysfunction of sacral region: Secondary | ICD-10-CM | POA: Diagnosis not present

## 2021-01-21 DIAGNOSIS — M9903 Segmental and somatic dysfunction of lumbar region: Secondary | ICD-10-CM | POA: Diagnosis not present

## 2021-01-26 ENCOUNTER — Telehealth: Payer: Self-pay | Admitting: Adult Health

## 2021-01-26 DIAGNOSIS — G8929 Other chronic pain: Secondary | ICD-10-CM

## 2021-01-26 DIAGNOSIS — M545 Low back pain, unspecified: Secondary | ICD-10-CM

## 2021-01-26 NOTE — Telephone Encounter (Signed)
Called pts wife.  Pt is having difficulty with standing/mobility , tremors , has not started PT needs new order at [facility] PT on [address].  (by [facility] provider).  Also increase sinemet to one tablet TID as 1/2 tab not doing it for him.  Ok to see early or try increase meds and PT

## 2021-01-26 NOTE — Telephone Encounter (Signed)
Pt;'s wife, Jaydn Moscato (on [place]) called, Parkinson symptoms is getting worse. Wanting to know if you can work him in any earlier. Would like a call from the nurse.

## 2021-01-26 NOTE — Telephone Encounter (Signed)
Would recommend doing PT first prior to medication adjustments as he also has chronic lower back issues which could be contributing to gait and balance difficulties.  Would not recommend further increasing Sinemet at this present time.  Order placed for PT at prior visit - should still be active.  Was sent to PT at [facility] at patient requested - they should be able to call to schedule initial evaluation.

## 2021-01-26 NOTE — Telephone Encounter (Signed)
Wife states that the sinemet would help in the triggering of nerves/muscles to get things moving (would assist in PT).  She would like for you to reconsider if you would increasing the sinemet to 1 tab po tid (since he is over 200lbs).

## 2021-01-27 NOTE — Telephone Encounter (Signed)
thanks

## 2021-01-27 NOTE — Telephone Encounter (Signed)
Called and LMVM for pts wife that did fax PT order to [facility] this am, confirmation received.  Also message about sinemet dose increase sent to Dr. Leonie Man, he is in hospital this week, does check messages periodically, will let you know once heard back.  FYI.

## 2021-01-27 NOTE — Telephone Encounter (Signed)
Dr. 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  He is currently being seen by neurosurgery.  Referred to PT which he has not started. Wife requests increasing dosage to 1 tab 3 times daily. I encouraged doing PT first but she is requesting I reconsider increasing dosage.  What would you recommend?  Sandy, RC Cc'd into [place] for [employer]

## 2021-01-28 LAB — CUP PACEART REMOTE DEVICE CHECK
Battery Remaining Percentage: 65 %
Brady Statistic RA Percent Paced: 87 %
Brady Statistic RV Percent Paced: 12 %
Date Time Interrogation Session: 20220330142210
Implantable Lead Implant Date: 20170525
Implantable Lead Implant Date: 20170525
Implantable Lead Location: 753859
Implantable Lead Location: 753860
Implantable Lead Model: 377
Implantable Lead Model: 377
Implantable Lead Serial Number: 49470224
Implantable Lead Serial Number: 49485613
Implantable Pulse Generator Implant Date: 20170525
Lead Channel Impedance Value: 488 Ohm
Lead Channel Impedance Value: 507 Ohm
Lead Channel Pacing Threshold Amplitude: 1.2 V
Lead Channel Pacing Threshold Pulse Width: 0.4 ms
Lead Channel Sensing Intrinsic Amplitude: 2.5 mV
Lead Channel Sensing Intrinsic Amplitude: 4.6 mV
Lead Channel Setting Pacing Amplitude: 2.4 V
Lead Channel Setting Pacing Amplitude: 2.4 V
Lead Channel Setting Pacing Pulse Width: 0.4 ms
Pulse Gen Model: 394969
Pulse Gen Serial Number: 68798589

## 2021-01-28 NOTE — Telephone Encounter (Signed)
Dr. Leonie Man, would you recommend any changes in his Sinemet dosage? Please see prior message that was sent yesterday around 8:14am. Thank you!

## 2021-01-29 ENCOUNTER — Ambulatory Visit (INDEPENDENT_AMBULATORY_CARE_PROVIDER_SITE_OTHER): Payer: Medicare HMO

## 2021-01-29 DIAGNOSIS — I495 Sick sinus syndrome: Secondary | ICD-10-CM | POA: Diagnosis not present

## 2021-01-29 MED ORDER — CARBIDOPA-LEVODOPA 25-100 MG PO TABS: 1.0000 | ORAL_TABLET | Freq: Three times a day (TID) | ORAL | 5 refills | Status: DC

## 2021-01-29 NOTE — Telephone Encounter (Signed)
Thanks. Nothing else

## 2021-01-29 NOTE — Addendum Note (Signed)
Addended by: Mal Misty on: 01/29/2021 02:26 PM   Modules accepted: Orders

## 2021-01-29 NOTE — Telephone Encounter (Signed)
Faythe Ghee will do. Thank you Dr. Leonie Man.     Lovey Newcomer, can you please advise wife of Dr. Clydene Fake recommendations - new rx sent to pharmacy with update Sinemet dosage. Thank you!

## 2021-01-29 NOTE — Addendum Note (Signed)
Addended by: Mal Misty on: 01/29/2021 12:05 PM   Modules accepted: Orders

## 2021-01-29 NOTE — Addendum Note (Signed)
Addended by: Brandon Melnick on: 01/29/2021 12:52 PM   Modules accepted: Orders

## 2021-01-29 NOTE — Telephone Encounter (Signed)
Agree with your reasoning but increase sinemet to 1 tablet tid but let wife know this may not work and he also needs PT and neurosurgery f/u

## 2021-01-29 NOTE — Telephone Encounter (Signed)
Called wife of pt.  Relayed that per Dr. Leonie Man ok for sinemet 25/100 1 tab TID, may not work, needs to also do PT and they do have appt 02-16-21 per wife at [facility] PT, but also neuro surgery consult.  Pt has not see NS before,  Ok to proceed with this.

## 2021-02-12 DIAGNOSIS — G20 Parkinson's disease: Secondary | ICD-10-CM | POA: Diagnosis not present

## 2021-02-12 DIAGNOSIS — F329 Major depressive disorder, single episode, unspecified: Secondary | ICD-10-CM | POA: Diagnosis not present

## 2021-02-12 DIAGNOSIS — I679 Cerebrovascular disease, unspecified: Secondary | ICD-10-CM | POA: Diagnosis not present

## 2021-02-12 DIAGNOSIS — I10 Essential (primary) hypertension: Secondary | ICD-10-CM | POA: Diagnosis not present

## 2021-02-12 DIAGNOSIS — E7800 Pure hypercholesterolemia, unspecified: Secondary | ICD-10-CM | POA: Diagnosis not present

## 2021-02-12 DIAGNOSIS — I2510 Atherosclerotic heart disease of native coronary artery without angina pectoris: Secondary | ICD-10-CM | POA: Diagnosis not present

## 2021-02-12 DIAGNOSIS — E119 Type 2 diabetes mellitus without complications: Secondary | ICD-10-CM | POA: Diagnosis not present

## 2021-02-12 DIAGNOSIS — G47419 Narcolepsy without cataplexy: Secondary | ICD-10-CM | POA: Diagnosis not present

## 2021-02-12 NOTE — Progress Notes (Signed)
Remote pacemaker transmission.   

## 2021-02-16 DIAGNOSIS — M545 Low back pain, unspecified: Secondary | ICD-10-CM | POA: Diagnosis not present

## 2021-02-16 DIAGNOSIS — R269 Unspecified abnormalities of gait and mobility: Secondary | ICD-10-CM | POA: Diagnosis not present

## 2021-02-18 ENCOUNTER — Telehealth: Payer: Self-pay

## 2021-02-18 NOTE — Telephone Encounter (Signed)
Patient's wife states that she noticed yesterday that the patient has "not been himself". She states that he slept all day, only waking up to use the restroom, and then slept all night. She says that he's the same way today. She's concerned because this is not like him at all. She was hoping to get him seen sooner, but [name] does not have anything available sooner. She would like to discuss with the nurse.

## 2021-02-18 NOTE — Telephone Encounter (Signed)
Called wife of pt.  Moved up 21mo Rv to 02-19-21.  Wife concerned of more sleepy then usual.  Started zoloft 50mg  po daily Monday (been taking in AM, has SE drowsiness could be this).  Not consistent with taking pm meds.  Zoloft started by pcp, for anxiety/frustration.  Taking sinemet 25/100 po tid (does not take consistently).  He awakens to eat, use RR, answers questions appropriately).

## 2021-02-19 ENCOUNTER — Ambulatory Visit: Payer: Medicare HMO | Admitting: Adult Health

## 2021-02-19 ENCOUNTER — Encounter: Payer: Self-pay | Admitting: Adult Health

## 2021-02-19 VITALS — BP 152/94 | HR 80 | Ht 67.0 in | Wt 225.0 lb

## 2021-02-19 DIAGNOSIS — G4719 Other hypersomnia: Secondary | ICD-10-CM | POA: Diagnosis not present

## 2021-02-19 DIAGNOSIS — I639 Cerebral infarction, unspecified: Secondary | ICD-10-CM

## 2021-02-19 DIAGNOSIS — G214 Vascular parkinsonism: Secondary | ICD-10-CM

## 2021-02-19 DIAGNOSIS — I6381 Other cerebral infarction due to occlusion or stenosis of small artery: Secondary | ICD-10-CM

## 2021-02-19 NOTE — Patient Instructions (Addendum)
Your Plan:  Low suspicion that increase sleepiness and fatigue is from his prior stroke or possible new stroke Would recommend scheduling visit with PCP to discuss use of sertaline and further evaluation Would also recommend that you follow up with your pulmonologist in regards to increased fatigue   Follow up in 3 months with Dr. Leonie Man     Thank you for coming to see [place] at [facility]. I hope we have been able to provide you high quality care today.  You may receive a patient satisfaction survey over the next few weeks. We would appreciate your feedback and comments so that we may continue to improve ourselves and the health of our patients.      Narcolepsy Narcolepsy is a neurological disorder that causes people to fall asleep suddenly and without control (have sleep attacks) during the daytime. It is a lifelong disorder. Narcolepsy disrupts the sleep cycle at night, which then causes daytime sleepiness. What are the causes? The cause of narcolepsy is not fully understood, but it may be related to:  Low levels of hypocretin, a chemical (neurotransmitter) in the brain that controls sleep and wake cycles. Hypocretin imbalance may be caused by: ? Abnormal genes that are passed from parent to child (inherited). ? An autoimmune disease in which the body's defense system (immune system) attacks the brain cells that make hypocretin.  Infection, tumor, or injury in the area of the brain that controls sleep.  Exposure to poisons (toxins), such as heavy metals, pesticides, and secondhand smoke. What are the signs or symptoms? Symptoms of this condition include:  Excessive daytime sleepiness. This is the most common symptom and is usually the first symptom you will notice. This may affect your performance at work or school.  Sleep attacks. You may fall asleep in the middle of an activity, especially low-energy activities like reading or watching TV.  Feeling like you  cannot think clearly and trouble focusing or remembering things. You may also feel depressed.  Sudden muscle weakness (cataplexy). When this occurs, your speech may become slurred, or your knees may buckle. Cataplexy is usually triggered by surprise, anger, fear, or laughter.  Losing the ability to speak or move (sleep paralysis). This may occur just as you start to fall asleep or wake up. You will be aware of the paralysis. It usually lasts for just a few seconds or minutes.  Seeing, hearing, tasting, smelling, or feeling things that are not real (hallucinations). Hallucinations may occur with sleep paralysis. They can happen when you are falling asleep, waking up, or dozing.  Trouble staying asleep at night (insomnia) and restless sleep. How is this diagnosed? This condition may be diagnosed based on:  A physical exam to rule out any other problems that may be causing your symptoms. You may be asked to write down your sleeping patterns for several weeks in a sleep diary. This will help your health care provider make a diagnosis.  Sleep studies that measure how well your REM sleep is regulated. These tests also measure your heart rate, breathing, movement, and brain waves. These tests include: ? An overnight sleep study (polysomnogram). ? A daytime sleep study that is done while you take several naps during the day (multiple sleep latency test, MSLT). This test measures how quickly you fall asleep and how quickly you enter REM sleep.  Removal of spinal fluid to measure hypocretin levels. How is this treated? There is no cure for this condition, but treatment can help relieve symptoms. Treatment  may include:  Lifestyle and sleeping strategies to help you cope with the condition, such as: ? Exercising regularly. ? Maintaining a regular sleep schedule. ? Avoiding caffeine and large meals before bed.  Medicines. These may include: ? Medicines that help keep you awake and alert (stimulants)  to fight daytime sleepiness. ? Medicines that treat depression (antidepressants). These may be used to treat cataplexy. ? Sodium oxybate. This is a strong medicine to help you relax (sedative) that you may take at night. It can help control daytime sleepiness and cataplexy.  Other treatments may include mental health counseling or joining a support group. Follow these instructions at home: Sleeping habits  Get about 8 hours of sleep every night.  Go to sleep and get up at about the same time every day.  Keep your bedroom dark, quiet, and comfortable.  When you feel very tired, take short naps. Schedule naps so that you take them at about the same time every day.  Before bedtime: ? Avoid bright lights and screens. ? Relax. Try activities like reading or taking a warm bath.   Activity  Get at least 20 minutes of exercise every day. This will help you sleep better at night and reduce daytime sleepiness.  Avoid exercising within 3 hours of bedtime.  Do not drive or use heavy machinery if you are sleepy. If possible, take a nap before driving.  Do not swim or go out on the water without a life jacket. Eating and drinking  Do not drink alcohol or caffeinated beverages within 4-5 hours of bedtime.  Do not eat a large meal before bedtime.  Eat meals at about the same times every day. General instructions  Take over-the-counter and prescription medicines only as told by your health care provider.  Keep a sleep diary as told by your health care provider.  Tell your employer or teachers that you have narcolepsy. You may be able to adjust your schedule to include time for naps.  Do not use any products that contain nicotine or tobacco, such as cigarettes, e-cigarettes, and chewing tobacco. If you need help quitting, ask your health care provider.  Keep all follow-up visits as told by your health care provider. This is important.   Where to find more information  [employer]  of Neurological Disorders: [employer]Box[employer].it Contact a health care provider if:  Your symptoms are not getting better.  You have increasingly high blood pressure (hypertension).  You have changes in your heart rhythm.  You are having a hard time determining what is real and what is not (psychosis). Get help right away if you:  Hurt yourself during a sleep attack or an attack of cataplexy.  Have chest pain.  Have trouble breathing. These symptoms may represent a serious problem that is an emergency. Do not wait to see if the symptoms will go away. Get medical help right away. Call your local emergency services (911 in the [place] Do not drive yourself to the hospital. Summary  Narcolepsy is a neurological disorder that causes people to fall asleep suddenly, and without control, during the daytime (sleep attacks). It is a lifelong disorder.  There is no cure for this condition, but treatment can help relieve symptoms.  Go to sleep and get up at about the same time every day. Follow instructions about sleep and activities as told by your health care provider.  Take over-the-counter and prescription medicines only as told by your health care provider. This information is not intended to replace  advice given to you by your health care provider. Make sure you discuss any questions you have with your health care provider. Document Revised: 05/30/2019 Document Reviewed: 05/30/2019 Elsevier Patient Education  [place].

## 2021-02-19 NOTE — Progress Notes (Signed)
[facility] [address] [place]. [place] [address] [phone]       STROKE FOLLOW UP NOTE  [name] Date of Birth:  [birth year] Medical Record Number:  [id number]   Reason for Referral: stroke follow up    SUBJECTIVE:   CHIEF COMPLAINT:  Chief Complaint  Patient presents with  . Hospitalization Follow-up    Tx rm with wife, concerned about fatigue, states he feel tried now      HPI:   Today, 02/19/2021, 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 Per wife, over the past 3 to 4 days, he has been experiencing increased daytime sleepiness where he is sleeping majority of the day as well as sleeping well at night.  Reports PCP recently changed Adderall IR to XR for narcolepsy but will occasionally miss a dose.  Also reports recently being started on sertraline 50 mg daily on 4/14 which he will take in the morning.  He also recently started PT on Monday for gait impairment and BPPV.  No other associated symptoms such as confusion, worsening cognition, weakness, numbness/tingling, speech deficit, visual changes or gait changes.  No further concerns at this time    History provided for reference purposes only Update 11/27/2020 JM: [name] returns per patient request for sooner scheduled visit due to continued gait impairment and lower back pain.  He is currently unaccompanied.  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  Tremors have been stable without worsening.  He does feel as though his ambulation is worse with increased back pain. He did have a mechanical fall seen in ED on 11/05/2020 thankfully without injury.   He does have chronic back pain previously doing routine exercise prior to his stroke but has not returned back to routine exercise since. He admits to sedentary life style  with limited physical activity. He will fall asleep easily during the day watching TV and occasional issues with insomnia. 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  No further concerns at this time.  Update 09/15/2020 JM: [name] returns for stroke follow-up accompanied by his wife.  He has been doing well since prior visit with residual decreased right hand dexterity and decreased sensory right fingertips.  Denies new or worsening stroke/TIA symptoms. He does complain of short-term memory loss such as forgetting an actors name or random fact.  He also continues to experience R>L upper extremity tremors and gait impairment with imbalance with fall in 06/2020 resulting in nasal bone fracture.  Wife reports shuffling type gait when fatigued.  Remains on Plavix and atorvastatin for secondary stroke prevention without side effects.  Blood pressure today 126/73.  Glucose level stable per patient.  Reports ongoing compliance with CPAP for OSA management managed by pulmonology Dr. Annamaria Boots.  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  He complains of excessive daytime fatigue, vivid dreams and occasional insomnia. Per patient, he has not trialed any other medication for narcolepsy.  No further concerns at this time.  Update 05/15/2020 JM: [name] is being seen for hospital follow-up accompanied by his wife.  He was discharged home from CIR on 04/29/2020 after 11 day stay.  Residual deficits of mild imbalance and mildly decreased sensation R finger  tips and decreased right hand fine motor skills. Does report ongoing improvement currently working with home health PT. he has had gait impairment ongoing and right hand decreased fine motor control with tremors and was evaluated by Dr. Rexene Alberts on 12/11/2019 for possible Parkinson's but was not felt this was Parkinson's related.  He does continue to use cane for ambulation  and denies any recent falls.  He is questioning return to driving as he was advised no driving at discharge until further cleared.  Completed 3 weeks DAPT and continues on Plavix alone without bleeding or bruising.  Continues on atorvastatin 40 mg daily without myalgias.  Blood pressure today 118/84.  Glucose levels stable.  Endorses ongoing use of CPAP for OSA management.  No further concerns at this time.   Stroke admission 04/14/2020 [name] is a 80 y.o. male with history of diabetes, stroke, hypertension, hyperlipidemia, permanent cardiac pacemaker  who presented on 04/14/2020 with R sided decreased sensation.  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  Previously on clopidogrel and recommended DAPT for 3 weeks then clopidogrel alone.  Presented in hypertensive urgency with BP 204/116 and recommended long-term BP goal normotensive range.  LDL 57 and recommended continuation of atorvastatin 40 mg daily.  Uncontrolled DM with A1c 7.9.  Other stroke risk factors include prior stroke 01/2018 with multifocal punctate infarcts concerning for embolic pattern, former tobacco use, EtOH use, obesity, CAD, OSA on CPAP and permanent cardiac pacemaker.  Evaluated by therapies who recommended CIR for residual deficits of imbalance and safety affecting ADLs and mobility.  Discharged to CIR on 04/18/2020.  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 Small vessel disease. Sinus dz. ASPECTS 10.     CTA head & neck no LVO. Aortic atherosclerosis.   MRI  L thalamic infarct. Small vessel disease. Atrophy.   2D Echo EF 65-70%. No source of embolus. LA mildly dilated.   Pacemaker interrogation no afib  LDL 57 -continued atorvastatin 40 mg daily  HgbA1c 7.9  Lovenox 40 mg sq daily for VTE prophylaxis  clopidogrel 75 mg daily prior to admission, now on aspirin 81 mg daily and clopidogrel 75  mg daily. Continue DAPT x 3 weeks then plavix alone    Therapy recommendations:  CIR  Disposition:  CIR      ROS:   14 system review of systems performed and negative with exception of those listed in HPI  PMH:  Past Medical History:  Diagnosis Date  . Arthritis    R shoulder, bone spur  . Arthritis    "hands" (03/25/2016)  . Chronic lower back pain   . Coronary heart disease    Dr Pernell Dupre  . H/O cardiovascular stress test    perhaps last one was 2009  . Heart murmur    12/29/11 echo: mild MR, no AS, trivial TR  . History of gout   . Hyperlipidemia   . Hypertension    saw Dr. Linard Millers last earl;y- 2014, cardiac cath. last done ?2009, blocks seen didn't require any intervention at that point.  . Narcolepsy    [name] 04-11-97; Mean Latency 1.18min, SOREM 2  . OSA on CPAP    NPSG 12-13-98 AHI 22.7  . PONV (postoperative nausea and vomiting)   . Presence of permanent cardiac pacemaker   . Shortness of breath    with exertion   . Stroke ([place])   .  Type II diabetes mellitus (HCC)     PSH:  Past Surgical History:  Procedure Laterality Date  . CARDIAC CATHETERIZATION  2009?  . EP IMPLANTABLE DEVICE N/A 03/25/2016   Procedure: Pacemaker Implant - Dual Chamber;  Surgeon: Evans Lance, MD;  Location: [facility];  Service: Cardiovascular;  Laterality: N/A;  . FRACTURE SURGERY    . INSERT / REPLACE / REMOVE PACEMAKER  03/24/2016  . LEFT AND RIGHT HEART CATHETERIZATION WITH CORONARY ANGIOGRAM N/A 06/05/2014   Procedure: LEFT AND RIGHT HEART CATHETERIZATION WITH CORONARY ANGIOGRAM;  Surgeon: Sinclair Grooms, MD;  Location: [facility];  Service: Cardiovascular;  Laterality: N/A;  . NASAL FRACTURE SURGERY    . SHOULDER ARTHROSCOPY WITH ROTATOR CUFF REPAIR AND SUBACROMIAL DECOMPRESSION Right 08/16/2013   Procedure: SHOULDER ARTHROSCOPY WITH ROTATOR CUFF REPAIR AND SUBACROMIAL DECOMPRESSION;  Surgeon: [name], MD;  Location: [facility];  Service: Orthopedics;   Laterality: Right;  Right shoulder arthroscopy rotator cuff repair, subacromial decompression.  . TONSILLECTOMY      Social History:  Social History   Socioeconomic History  . Marital status: Married    Spouse name: Not on file  . Number of children: Not on file  . Years of education: Not on file  . Highest education level: Not on file  Occupational History  . Occupation: Chiropodist  . Occupation: [place] [employer]  Tobacco Use  . Smoking status: Former Smoker    Packs/day: 2.50    Years: 6.00    Pack years: 15.00    Types: Cigarettes    Quit date: 06/05/1967    Years since quitting: 53.7  . Smokeless tobacco: Never Used  Vaping Use  . Vaping Use: Never used  Substance and Sexual Activity  . Alcohol use: Yes    Comment: rare  . Drug use: No  . Sexual activity: Not Currently  Other Topics Concern  . Not on file  Social History Narrative  . Not on file   Social Determinants of Health   Financial Resource Strain: Not on file  Food Insecurity: Not on file  Transportation Needs: Not on file  Physical Activity: Not on file  Stress: Not on file  Social Connections: Not on file  Intimate Partner Violence: Not on file    Family History:  Family History  Problem Relation Age of Onset  . Sleep apnea Sister   . Colon cancer Sister     Medications:   Current Outpatient Medications on File Prior to Visit  Medication Sig Dispense Refill  . amphetamine-dextroamphetamine (ADDERALL XR) 10 MG 24 hr capsule Take 10 mg by mouth daily.    [name] amphetamine-dextroamphetamine (ADDERALL) 10 MG tablet Take 10 mg by mouth 2 (two) times daily.    [name] atorvastatin (LIPITOR) 40 MG tablet Take 40 mg by mouth 2 (two) times daily at 10 AM and 5 PM. Pt taking Half tablet twice daily    . betamethasone dipropionate (DIPROLENE) 0.05 % ointment 1 application    . carbidopa-levodopa (SINEMET) 25-100 MG tablet Take 1 tablet by mouth 3 (three) times daily. 90 tablet 5  . clopidogrel (PLAVIX)  75 MG tablet Take 1 tablet (75 mg total) by mouth daily. 30 tablet 1  . Coenzyme Q10 (CO Q-10 PO) Take 1 capsule by mouth in the morning and at bedtime.    . fluticasone (FLONASE) 50 MCG/ACT nasal spray Place 1 spray into both nostrils daily. 16 g 3  . lisinopril (ZESTRIL) 5 MG tablet 1 tablet    .  loratadine (CLARITIN) 10 MG tablet Take 10 mg by mouth daily.    . metFORMIN (GLUCOPHAGE-XR) 500 MG 24 hr tablet Take 4 tablets (2,000 mg total) by mouth daily with breakfast. 120 tablet 0  . Multiple Vitamin (MULTIVITAMIN WITH MINERALS) TABS tablet Take 1 tablet by mouth daily.    [name] NITROSTAT 0.4 MG SL tablet PLACE 1 TABLET (0.4 MG TOTAL) UNDER THE TONGUE EVERY 5 (FIVE) MINUTES AS NEEDED FOR CHEST PAIN. (Patient taking differently: Place 0.4 mg under the tongue every 5 (five) minutes as needed for chest pain.) 25 tablet 5  . Omega-3 Fatty Acids (OMEGA-3 PO) Take 1 capsule by mouth 3 (three) times daily.     [name] PRESCRIPTION MEDICATION See admin instructions. CPAP- At bedtime    . sertraline (ZOLOFT) 50 MG tablet 50 mg daily.     No current facility-administered medications on file prior to visit.    Allergies:  No Known Allergies    OBJECTIVE:  Physical Exam  Vitals:   02/19/21 1012  BP: (!) 152/94  Pulse: 80  Weight: 225 lb (102.1 kg)  Height: 5\' 7"  (1.702 m)   Body mass index is 35.24 kg/m. No exam data present  General: well developed, well nourished, very pleasant elderly Caucasian male, seated, in no evident distress Head: head normocephalic and atraumatic.   Neck: supple with no carotid or supraclavicular bruits Cardiovascular: regular rate and rhythm, no murmurs Musculoskeletal: no deformity Skin:  no rash/petichiae Vascular:  Normal pulses all extremities   Neurologic Exam Mental Status: Awake and fully alert. Fluent speech and language. Oriented to place and time. Recent and remote memory intact. Attention span, concentration and fund of knowledge appropriate. Mood and  affect appropriate.  Cranial Nerves: Pupils equal, briskly reactive to light. Extraocular movements full without nystagmus. Visual fields full to confrontation. Hearing intact. Facial sensation intact. Face, tongue, palate moves normally and symmetrically.  Motor: Normal bulk and tone. Normal strength in all tested extremity muscles. Sensory.: intact to touch , pinprick , position and vibratory sensation.  Coordination: Rapid alternating movements normal in all extremities except slightly decreased right hand. Finger-to-nose and heel-to-shin performed accurately bilaterally.  Slight decreased right hand fine motor control.  Mild and occasional right hand resting tremor.  B/l R>L UE postural tremor with mild R cogwheel rigidity. Mild R>L bradykinesia.   Gait and Station: Arises from chair with mild difficulty. Stance is slightly hunched. Gait demonstrates broad-based gait with decreased stride length and mild imbalance and postural instability.  Currently ambulating without assistive device Reflexes: 1+ and symmetric. Toes downgoing.        ASSESSMENT/PLAN: Jordan [name] is a 80 y.o. 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 Vascular risk factors include HTN, HLD, DM, prior stroke 01/2018, EtOH use, obesity, CAD, OSA on CPAP and pacer.     1. Worsening daytime fatigue:  a. Recent onset over the past 3 to 4 days.  High suspicion for possible medication side effect as recently started on sertraline.  He was advised to follow-up with his PCP for further discussion or possibly start taking prior to bedtime.  He declines further evaluation with lab work and wishes to hold off until he sees his PCP as he is usually a "hard stick".   b. Also encouraged follow-up with pulmonology to further discuss further narcolepsy treatment -has been on Adderall for "over 20 years" -information  provided regarding [place] and [employer]  which may be of benefit and may reduce tremor as Adderall can increase this.   c. Unable to appreciate any focal deficits and denies any new associated general or neurological symptoms since increased fatigue -no indication at this time for further neurological evaluation 2. 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 Rexene Alberts.  Continue Sinemet 1 tab TID as this has provided benefit and decrease symptoms.  Recently started working with PT and encouraged continuation 3. Hx of Left thalamic stroke:  a. Continue clopidogrel 75 mg daily  and atorvastatin 40 mg daily for secondary stroke prevention. b. Discussed secondary stroke prevention measures and importance of close PCP follow-up for aggressive risk factor management including HTN, HLD and DM  4. OSA on CPAP: Continue nightly compliance followed by pulmonology 5. Narcolepsy: See #1    Follow-up in 3 months with Dr. Leonie Man per request as they have yet to be seen by him since coming to our office   CC:  [place] provider: Dr. Lawanda Cousins, Gwyndolyn Saxon, MD    I spent 40 minutes of face-to-face and non-face-to-face time with patient and wife.  This included previsit chart review, lab review, study review, order entry, electronic health record documentation, patient education and discussion regarding recent onset of increased fatigue and possible contributing factors, history of prior stroke and importance of managing stroke risk factors, parkinsonianism and use of Sinemet, known narcolepsy and further treatment options and answered all other questions to patient and wife satisfaction   Frann Rider, AGNP-BC  [facility] [address] [place], [place] [address]  Phone [phone] Fax [phone] Note: This document was prepared with digital dictation and possible smart phrase technology. Any transcriptional errors that result from this process are  unintentional.

## 2021-02-23 NOTE — Progress Notes (Signed)
I agree with the above plan 

## 2021-02-25 DIAGNOSIS — M545 Low back pain, unspecified: Secondary | ICD-10-CM | POA: Diagnosis not present

## 2021-02-25 DIAGNOSIS — R269 Unspecified abnormalities of gait and mobility: Secondary | ICD-10-CM | POA: Diagnosis not present

## 2021-03-02 DIAGNOSIS — M545 Low back pain, unspecified: Secondary | ICD-10-CM | POA: Diagnosis not present

## 2021-03-02 DIAGNOSIS — R269 Unspecified abnormalities of gait and mobility: Secondary | ICD-10-CM | POA: Diagnosis not present

## 2021-03-03 ENCOUNTER — Ambulatory Visit: Payer: Medicare HMO | Admitting: Adult Health

## 2021-03-04 DIAGNOSIS — R269 Unspecified abnormalities of gait and mobility: Secondary | ICD-10-CM | POA: Diagnosis not present

## 2021-03-04 DIAGNOSIS — M545 Low back pain, unspecified: Secondary | ICD-10-CM | POA: Diagnosis not present

## 2021-03-09 DIAGNOSIS — M545 Low back pain, unspecified: Secondary | ICD-10-CM | POA: Diagnosis not present

## 2021-03-09 DIAGNOSIS — R269 Unspecified abnormalities of gait and mobility: Secondary | ICD-10-CM | POA: Diagnosis not present

## 2021-03-16 DIAGNOSIS — M545 Low back pain, unspecified: Secondary | ICD-10-CM | POA: Diagnosis not present

## 2021-03-16 DIAGNOSIS — R269 Unspecified abnormalities of gait and mobility: Secondary | ICD-10-CM | POA: Diagnosis not present

## 2021-03-17 ENCOUNTER — Ambulatory Visit: Payer: Medicare HMO | Admitting: Adult Health

## 2021-03-23 DIAGNOSIS — R269 Unspecified abnormalities of gait and mobility: Secondary | ICD-10-CM | POA: Diagnosis not present

## 2021-03-23 DIAGNOSIS — M545 Low back pain, unspecified: Secondary | ICD-10-CM | POA: Diagnosis not present

## 2021-04-14 DIAGNOSIS — M545 Low back pain, unspecified: Secondary | ICD-10-CM | POA: Diagnosis not present

## 2021-04-14 DIAGNOSIS — R269 Unspecified abnormalities of gait and mobility: Secondary | ICD-10-CM | POA: Diagnosis not present

## 2021-04-28 DIAGNOSIS — R269 Unspecified abnormalities of gait and mobility: Secondary | ICD-10-CM | POA: Diagnosis not present

## 2021-04-28 DIAGNOSIS — M545 Low back pain, unspecified: Secondary | ICD-10-CM | POA: Diagnosis not present

## 2021-04-30 ENCOUNTER — Ambulatory Visit (INDEPENDENT_AMBULATORY_CARE_PROVIDER_SITE_OTHER): Payer: Medicare HMO

## 2021-04-30 DIAGNOSIS — I495 Sick sinus syndrome: Secondary | ICD-10-CM | POA: Diagnosis not present

## 2021-05-04 LAB — CUP PACEART REMOTE DEVICE CHECK
Date Time Interrogation Session: 20220630143407
Implantable Lead Implant Date: 20170525
Implantable Lead Implant Date: 20170525
Implantable Lead Location: 753859
Implantable Lead Location: 753860
Implantable Lead Model: 377
Implantable Lead Model: 377
Implantable Lead Serial Number: 49470224
Implantable Lead Serial Number: 49485613
Implantable Pulse Generator Implant Date: 20170525
Pulse Gen Model: 394969
Pulse Gen Serial Number: 68798589

## 2021-05-05 DIAGNOSIS — M545 Low back pain, unspecified: Secondary | ICD-10-CM | POA: Diagnosis not present

## 2021-05-05 DIAGNOSIS — R269 Unspecified abnormalities of gait and mobility: Secondary | ICD-10-CM | POA: Diagnosis not present

## 2021-05-11 DIAGNOSIS — M545 Low back pain, unspecified: Secondary | ICD-10-CM | POA: Diagnosis not present

## 2021-05-11 DIAGNOSIS — R269 Unspecified abnormalities of gait and mobility: Secondary | ICD-10-CM | POA: Diagnosis not present

## 2021-05-12 ENCOUNTER — Encounter: Payer: Self-pay | Admitting: Neurology

## 2021-05-12 ENCOUNTER — Ambulatory Visit: Payer: Medicare HMO | Admitting: Neurology

## 2021-05-12 VITALS — BP 105/65 | HR 82 | Ht 69.0 in | Wt 217.5 lb

## 2021-05-12 DIAGNOSIS — G20 Parkinson's disease: Secondary | ICD-10-CM

## 2021-05-12 DIAGNOSIS — I639 Cerebral infarction, unspecified: Secondary | ICD-10-CM

## 2021-05-12 DIAGNOSIS — I6381 Other cerebral infarction due to occlusion or stenosis of small artery: Secondary | ICD-10-CM

## 2021-05-12 MED ORDER — CARBIDOPA-LEVODOPA 25-100 MG PO TABS: 1.0000 | ORAL_TABLET | Freq: Two times a day (BID) | ORAL | 5 refills | Status: DC

## 2021-05-12 NOTE — Patient Instructions (Signed)
I had a long d/w patient and his wife about his prior  thalamic stroke, risk for recurrent stroke/TIAs, personally independently reviewed imaging studies and stroke evaluation results and answered questions.Continue Plavix 75 mg daily  for secondary stroke prevention and maintain strict control of hypertension with blood pressure goal below 130/90, diabetes with hemoglobin A1c goal below 6.5% and lipids with LDL cholesterol goal below 70 mg/dL. I also advised the patient to eat a healthy diet with plenty of whole grains, cereals, fruits and vegetables, exercise regularly and maintain ideal body weight .we also talked about his parkinsonian tremor which appears quite mild and intermittent and he has been taking Sinemet only as needed.  I recommended he take the Sinemet at least twice daily since some of his stiffness and tiredness may be related to that and might improve.  I advised him to discuss his chronic low back pain and fatigue with primary care physician Dr. Kenton Kingfisher at his upcoming visit and seek further guidance from him.  Followup in the future with my nurse practitioner [name] in 39months or call earlier if necessary .

## 2021-05-12 NOTE — Progress Notes (Signed)
[facility] [address] [place]. [place] [address] [phone]       STROKE FOLLOW UP NOTE  [name] Date of Birth:  May 20, 1941 Medical Record Number:  [id number]   Reason for Referral: stroke follow up   :  Office visit 05/12/2021 SUBJECTIVE:  He returns for follow-up after last visit with [name] nurse practitioner in April 2022.  Is accompanied by his wife.  Patient has had no recurrent stroke or TIA symptoms.  He continues to have mild dizziness and imbalance in the right side paresthesias and diminished fine motor skills.  He occasionally has tingling of his fingers or his feet but these are not bothersome.  He is quite active and works out 3 days a week at [employer].  He remains on Plavix which is tolerating well without bruising or bleeding.  He takes Lipitor 20 mg daily and is tolerating it well.  He states his primary care physician did check his lipid profile and A1c about 5 weeks ago but I do not have those results and he was told they were satisfactory.  He does have chronic low back pain and also complains of fatigue and tiredness.  He has not discussed this with primary physician had any work-up done for this.  Patient has history of parkinsonian tremor and has a prescription for Sinemet 25/103 times daily but states that he gets it only once a day or so as he forgets to take it and other times.  Tremor seems intermittent and not constant and is not functionally disabling.  Wife feels that he does have a shuffling gait particularly when he is tired.  He however denies significant bradykinesia and is able to get up and walk by himself easily without assistance.  He denies drooling of saliva or micrographia.  He has not had any particular worsening of his tremors or other parkinsonian symptoms in recent months.      Office visit 02/19/2021, 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 Per wife, over the past 3 to 4 days, he has been experiencing increased daytime sleepiness where he is sleeping majority of the day as well as sleeping well at night.  Reports PCP recently changed Adderall IR to XR for narcolepsy but will occasionally miss a dose.  Also reports recently being started on sertraline 50 mg daily on 4/14 which he will take in the morning.  He also recently started PT on Monday for gait impairment and BPPV.  No other associated symptoms such as confusion, worsening cognition, weakness, numbness/tingling, speech deficit, visual changes or gait changes.  No further concerns at this time    History provided for reference purposes only Update 11/27/2020 JM: [name] returns per patient request for sooner scheduled visit due to continued gait impairment and lower back pain.  He is currently unaccompanied.  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  Tremors have been stable without worsening.  He does feel as though his ambulation is worse with increased back pain. He did have a mechanical fall seen in ED on 11/05/2020 thankfully without injury.   He does have chronic back pain previously doing routine exercise prior to his stroke but has not returned back to routine exercise since. He admits to sedentary life style with limited physical activity. He will fall asleep  easily during the day watching TV and occasional issues with insomnia. 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  No further concerns at this time.  Update 09/15/2020 JM: [name] returns for stroke follow-up accompanied by his wife.  He has been doing well since prior visit with residual decreased right hand dexterity and decreased sensory right fingertips.  Denies new or worsening stroke/TIA symptoms. He does complain of short-term memory loss such as  forgetting an actors name or random fact.  He also continues to experience R>L upper extremity tremors and gait impairment with imbalance with fall in 06/2020 resulting in nasal bone fracture.  Wife reports shuffling type gait when fatigued.  Remains on Plavix and atorvastatin for secondary stroke prevention without side effects.  Blood pressure today 126/73.  Glucose level stable per patient.  Reports ongoing compliance with CPAP for OSA management managed by pulmonology Dr. Annamaria Boots.  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  He complains of excessive daytime fatigue, vivid dreams and occasional insomnia. Per patient, he has not trialed any other medication for narcolepsy.  No further concerns at this time.  Update 05/15/2020 JM: [name] is being seen for hospital follow-up accompanied by his wife.  He was discharged home from CIR on 04/29/2020 after 11 day stay.  Residual deficits of mild imbalance and mildly decreased sensation R finger tips and decreased right hand fine motor skills. Does report ongoing improvement currently working with home health PT. he has had gait impairment ongoing and right hand decreased fine motor control with tremors and was evaluated by Dr. Rexene Alberts on 12/11/2019 for possible Parkinson's but was not felt this was Parkinson's related.  He does continue to use cane for ambulation and denies any recent falls.  He is questioning return to driving as he was advised no driving at discharge until further cleared.  Completed 3 weeks DAPT and continues on Plavix alone without bleeding or bruising.  Continues on atorvastatin 40 mg daily without myalgias.  Blood pressure today 118/84.  Glucose levels stable.  Endorses ongoing use of CPAP for OSA management.  No further concerns at this time.   Stroke admission 04/14/2020 [name] is a 80 y.o. male with history of  diabetes, stroke, hypertension, hyperlipidemia, permanent cardiac pacemaker  who  presented on 04/14/2020 with R sided decreased sensation.  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  Previously on clopidogrel and recommended DAPT for 3 weeks then clopidogrel alone.  Presented in hypertensive urgency with BP 204/116 and recommended long-term BP goal normotensive range.  LDL 57 and recommended continuation of atorvastatin 40 mg daily.  Uncontrolled DM with A1c 7.9.  Other stroke risk factors include prior stroke 01/2018 with multifocal punctate infarcts concerning for embolic pattern, former tobacco use, EtOH use, obesity, CAD, OSA on CPAP and permanent cardiac pacemaker.  Evaluated by therapies who recommended CIR for residual deficits of imbalance and safety affecting ADLs and mobility.  Discharged to CIR on 04/18/2020.  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 Small vessel disease. Sinus dz. ASPECTS 10.    CTA head & neck no LVO. Aortic atherosclerosis.  MRI  L thalamic infarct. Small vessel disease. Atrophy.  2D Echo EF 65-70%. No source of embolus. LA mildly dilated.  Pacemaker interrogation no afib LDL 57 -continued atorvastatin  40 mg daily HgbA1c 7.9 Lovenox 40 mg sq daily for VTE prophylaxis clopidogrel 75 mg daily prior to admission, now on aspirin 81 mg daily and clopidogrel 75 mg daily. Continue DAPT x 3 weeks then plavix alone   Therapy recommendations:  CIR Disposition:  CIR      ROS:   14 system review of systems performed and negative with exception of those listed in HPI  PMH:  Past Medical History:  Diagnosis Date   Arthritis    R shoulder, bone spur   Arthritis    "hands" (03/25/2016)   Chronic lower back pain    Coronary heart disease    Dr Pernell Dupre   H/O cardiovascular stress test    perhaps last one was 2009   Heart murmur    12/29/11 echo: mild MR, no AS, trivial TR   History of gout    Hyperlipidemia    Hypertension    saw Dr. Linard Millers last earl;y- 2014, cardiac cath. last done ?2009, blocks seen didn't require any intervention at that point.   Narcolepsy    [name] 04-11-97; Mean Latency 1.52min, SOREM 2   OSA on CPAP    NPSG 12-13-98 AHI 22.7   PONV (postoperative nausea and vomiting)    Presence of permanent cardiac pacemaker    Shortness of breath    with exertion    Stroke ([place])    Type II diabetes mellitus ([place])     PSH:  Past Surgical History:  Procedure Laterality Date   CARDIAC CATHETERIZATION  2009?   EP IMPLANTABLE DEVICE N/A 03/25/2016   Procedure: Pacemaker Implant - Dual Chamber;  Surgeon: Evans Lance, MD;  Location: [facility];  Service: Cardiovascular;  Laterality: N/A;   FRACTURE SURGERY     INSERT / REPLACE / REMOVE PACEMAKER  03/24/2016   LEFT AND RIGHT HEART CATHETERIZATION WITH CORONARY ANGIOGRAM N/A 06/05/2014   Procedure: LEFT AND RIGHT HEART CATHETERIZATION WITH CORONARY ANGIOGRAM;  Surgeon: Sinclair Grooms, MD;  Location: [facility];  Service: Cardiovascular;  Laterality: N/A;   NASAL FRACTURE SURGERY     SHOULDER ARTHROSCOPY WITH ROTATOR CUFF REPAIR AND SUBACROMIAL DECOMPRESSION Right 08/16/2013   Procedure: SHOULDER ARTHROSCOPY WITH ROTATOR CUFF REPAIR AND SUBACROMIAL DECOMPRESSION;  Surgeon: [name], MD;  Location: [facility];  Service: Orthopedics;  Laterality: Right;  Right shoulder arthroscopy rotator cuff repair, subacromial decompression.   TONSILLECTOMY      Social History:  Social History   Socioeconomic History   Marital status: Married    Spouse name: [name]   Number of children: Not on file   Years of education: Not on file   Highest education level: Not on file  Occupational History   Occupation: [employer]   Occupation: [place] [employer]  Tobacco Use   Smoking status: Former    Packs/day: 2.50    Years: 6.00    Pack years: 15.00    Types: Cigarettes    Quit date: 06/05/1967    Years since quitting: 53.9   Smokeless tobacco: Never   Vaping Use   Vaping Use: Never used  Substance and Sexual Activity   Alcohol use: Yes    Comment: rare   Drug use: No   Sexual activity: Not Currently  Other Topics Concern   Not on file  Social History Narrative   Lives with wife and son   Right handed   Drinks 1cups caffeine daily   Social Determinants of Health   Financial  Resource Strain: Not on file  Food Insecurity: Not on file  Transportation Needs: Not on file  Physical Activity: Not on file  Stress: Not on file  Social Connections: Not on file  Intimate Partner Violence: Not on file    Family History:  Family History  Problem Relation Age of Onset   Sleep apnea Sister    Colon cancer Sister     Medications:   Current Outpatient Medications on File Prior to Visit  Medication Sig Dispense Refill   atorvastatin (LIPITOR) 40 MG tablet Take 40 mg by mouth 2 (two) times daily at 10 AM and 5 PM. Pt taking Half tablet twice daily     betamethasone dipropionate (DIPROLENE) 0.05 % ointment 1 application     clopidogrel (PLAVIX) 75 MG tablet Take 1 tablet (75 mg total) by mouth daily. 30 tablet 1   Coenzyme Q10 (CO Q-10 PO) Take 1 capsule by mouth daily.     lisinopril (ZESTRIL) 5 MG tablet 1 tablet     loratadine (CLARITIN) 10 MG tablet Take 10 mg by mouth daily.     metFORMIN (GLUCOPHAGE-XR) 500 MG 24 hr tablet Take 4 tablets (2,000 mg total) by mouth daily with breakfast. 120 tablet 0   Multiple Vitamin (MULTIVITAMIN WITH MINERALS) TABS tablet Take 1 tablet by mouth daily.     NITROSTAT 0.4 MG SL tablet PLACE 1 TABLET (0.4 MG TOTAL) UNDER THE TONGUE EVERY 5 (FIVE) MINUTES AS NEEDED FOR CHEST PAIN. (Patient taking differently: Place 0.4 mg under the tongue every 5 (five) minutes as needed for chest pain.) 25 tablet 5   Omega-3 Fatty Acids (OMEGA-3 PO) Take 1 capsule by mouth 3 (three) times daily.      PRESCRIPTION MEDICATION See admin instructions. CPAP- At bedtime     sertraline (ZOLOFT) 50 MG tablet 50 mg daily.      amphetamine-dextroamphetamine (ADDERALL XR) 10 MG 24 hr capsule Take 10 mg by mouth daily.     amphetamine-dextroamphetamine (ADDERALL) 10 MG tablet Take 10 mg by mouth 2 (two) times daily.     fluticasone (FLONASE) 50 MCG/ACT nasal spray Place 1 spray into both nostrils daily. 16 g 3   No current facility-administered medications on file prior to visit.    Allergies:  No Known Allergies    OBJECTIVE:  Physical Exam  Vitals:   05/12/21 1114  BP: 105/65  Pulse: 82  Weight: 217 lb 8 oz (98.7 kg)  Height: 5\' 9"  (1.753 m)   Body mass index is 32.12 kg/m. No results found.  General: Obese , very pleasant elderly Caucasian male, seated, in no evident distress Head: head normocephalic and atraumatic.   Neck: supple with no carotid or supraclavicular bruits Cardiovascular: regular rate and rhythm, no murmurs Musculoskeletal: no deformity Skin:  no rash/petichiae Vascular:  Normal pulses all extremities   Neurologic Exam Mental Status: Awake and fully alert. Fluent speech and language. Oriented to place and time. Recent and remote memory intact. Attention span, concentration and fund of knowledge appropriate. Mood and affect appropriate.  Mild diminished facial expression.  Glabellar tap is positive.  No significant ptosis with sustained upgaze or any subjective diplopia. Cranial Nerves: Pupils equal, briskly reactive to light. Extraocular movements full without nystagmus. Visual fields full to confrontation. Hearing intact. Facial sensation intact. Face, tongue, palate moves normally and symmetrically.  Motor: Normal bulk and tone. Normal strength in all tested extremity muscles.  Diminished fine finger movements on the right and orbits left over right upper extremity.  Mild intermittent tremor of the right upper extremity at rest with absent with position holding and with walking.  No cogwheel rigidity or bradykinesia.  Able to get up easily from the chair with arms folded across her  chest.  Froment's sign is negative. Sensory.: intact to touch , pinprick , position and vibratory sensation.  Coordination: Rapid alternating movements normal in all extremities except slightly decreased right hand. Finger-to-nose and heel-to-shin performed accurately bilaterally.  Slight decreased right hand fine motor control.  Mild and occasional right hand resting tremor.  B/l R>L UE postural tremor with mild R cogwheel rigidity. Mild R>L bradykinesia.   Gait and Station: Arises from chair with mild difficulty. Stance is slightly hunched. Gait demonstrates broad-based gait with decreased stride length and mild imbalance and postural instability.  No festination, shuffling or short steps.  Good postural response to threat Reflexes: 1+ and symmetric. Toes downgoing.        ASSESSMENT/PLAN: Jordan [name] is a 80 y.o. 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 Vascular risk factors include HTN, HLD, DM, prior stroke 01/2018, EtOH use, obesity, CAD, OSA on CPAP and pacer.  He also has mild parkinsonian tremor which seems to be well controlled on present low-dose Sinemet and is not functionally disabling.   I had a long d/w patient and his wife about his prior  thalamic stroke, risk for recurrent stroke/TIAs, personally independently reviewed imaging studies and stroke evaluation results and answered questions.Continue Plavix 75 mg daily  for secondary stroke prevention and maintain strict control of hypertension with blood pressure goal below 130/90, diabetes with hemoglobin A1c goal below 6.5% and lipids with LDL cholesterol goal below 70 mg/dL. I also advised the patient to eat a healthy diet with plenty of whole grains, cereals, fruits and vegetables, exercise regularly and maintain ideal body weight .we also talked about his parkinsonian tremor which appears quite mild and  intermittent and he has been taking Sinemet only as needed.  I recommended he take the Sinemet at least twice daily since some of his stiffness and tiredness may be related to that and might improve.  I advised him to discuss his chronic low back pain and fatigue with primary care physician Dr. Kenton Kingfisher at his upcoming visit and seek further guidance from him.  Followup in the future with my nurse practitioner [name] in 29months or call earlier if necessary .  Greater than 50% time during this prolonged 40-minute follow-up visit was spent on counseling and coordination of care about stroke prevention and parkinsonian tremor management and answering questions   Antony Contras, MD  [facility] [address] [place], [place] [address]  Phone [phone] Fax [phone] Note: This document was prepared with digital dictation and possible smart phrase technology. Any transcriptional errors that result from this process are unintentional.

## 2021-05-15 DIAGNOSIS — E1169 Type 2 diabetes mellitus with other specified complication: Secondary | ICD-10-CM | POA: Diagnosis not present

## 2021-05-15 DIAGNOSIS — I10 Essential (primary) hypertension: Secondary | ICD-10-CM | POA: Diagnosis not present

## 2021-05-15 DIAGNOSIS — G4730 Sleep apnea, unspecified: Secondary | ICD-10-CM | POA: Diagnosis not present

## 2021-05-15 DIAGNOSIS — E7800 Pure hypercholesterolemia, unspecified: Secondary | ICD-10-CM | POA: Diagnosis not present

## 2021-05-15 DIAGNOSIS — Z950 Presence of cardiac pacemaker: Secondary | ICD-10-CM | POA: Diagnosis not present

## 2021-05-15 DIAGNOSIS — G20 Parkinson's disease: Secondary | ICD-10-CM | POA: Diagnosis not present

## 2021-05-15 DIAGNOSIS — I679 Cerebrovascular disease, unspecified: Secondary | ICD-10-CM | POA: Diagnosis not present

## 2021-05-15 DIAGNOSIS — F329 Major depressive disorder, single episode, unspecified: Secondary | ICD-10-CM | POA: Diagnosis not present

## 2021-05-15 DIAGNOSIS — G47419 Narcolepsy without cataplexy: Secondary | ICD-10-CM | POA: Diagnosis not present

## 2021-05-15 DIAGNOSIS — M6281 Muscle weakness (generalized): Secondary | ICD-10-CM | POA: Diagnosis not present

## 2021-05-20 NOTE — Progress Notes (Signed)
Remote pacemaker transmission.   

## 2021-05-21 DIAGNOSIS — M545 Low back pain, unspecified: Secondary | ICD-10-CM | POA: Diagnosis not present

## 2021-05-21 DIAGNOSIS — R269 Unspecified abnormalities of gait and mobility: Secondary | ICD-10-CM | POA: Diagnosis not present

## 2021-05-26 DIAGNOSIS — R269 Unspecified abnormalities of gait and mobility: Secondary | ICD-10-CM | POA: Diagnosis not present

## 2021-05-26 DIAGNOSIS — M545 Low back pain, unspecified: Secondary | ICD-10-CM | POA: Diagnosis not present

## 2021-06-02 ENCOUNTER — Telehealth: Payer: Self-pay | Admitting: Neurology

## 2021-06-02 DIAGNOSIS — G4733 Obstructive sleep apnea (adult) (pediatric): Secondary | ICD-10-CM | POA: Diagnosis not present

## 2021-06-02 NOTE — Telephone Encounter (Signed)
Pt's wife, Shriyans Dilullo (on [place]) called,  he is having difficulty moving and ambulatory, tremors. Do not know if his current dosage meets his needs for his condition. Would like a call from the nurse to discuss a sooner appt.

## 2021-06-02 NOTE — Telephone Encounter (Signed)
Called wife of pt.  Pt having worsening gait, tremors , fatigues easily.  Using cane.  Concerned about falling. Was able to move up appt to 07-20-21 at 2pm (from 08-03-21.  Placed on waitlist, she may call as well daily for an cancellations.

## 2021-06-09 DIAGNOSIS — R269 Unspecified abnormalities of gait and mobility: Secondary | ICD-10-CM | POA: Diagnosis not present

## 2021-06-09 DIAGNOSIS — M545 Low back pain, unspecified: Secondary | ICD-10-CM | POA: Diagnosis not present

## 2021-06-10 DIAGNOSIS — M5136 Other intervertebral disc degeneration, lumbar region: Secondary | ICD-10-CM | POA: Diagnosis not present

## 2021-06-10 DIAGNOSIS — M9903 Segmental and somatic dysfunction of lumbar region: Secondary | ICD-10-CM | POA: Diagnosis not present

## 2021-06-10 DIAGNOSIS — M9905 Segmental and somatic dysfunction of pelvic region: Secondary | ICD-10-CM | POA: Diagnosis not present

## 2021-06-10 DIAGNOSIS — M9904 Segmental and somatic dysfunction of sacral region: Secondary | ICD-10-CM | POA: Diagnosis not present

## 2021-06-16 DIAGNOSIS — M545 Low back pain, unspecified: Secondary | ICD-10-CM | POA: Diagnosis not present

## 2021-06-16 DIAGNOSIS — R269 Unspecified abnormalities of gait and mobility: Secondary | ICD-10-CM | POA: Diagnosis not present

## 2021-06-17 ENCOUNTER — Ambulatory Visit: Payer: Medicare HMO | Admitting: Neurology

## 2021-06-17 ENCOUNTER — Encounter: Payer: Self-pay | Admitting: Neurology

## 2021-06-17 VITALS — BP 152/94 | HR 79 | Ht 68.0 in | Wt 220.0 lb

## 2021-06-17 DIAGNOSIS — Z9181 History of falling: Secondary | ICD-10-CM

## 2021-06-17 DIAGNOSIS — R251 Tremor, unspecified: Secondary | ICD-10-CM

## 2021-06-17 DIAGNOSIS — R269 Unspecified abnormalities of gait and mobility: Secondary | ICD-10-CM

## 2021-06-17 DIAGNOSIS — Z8673 Personal history of transient ischemic attack (TIA), and cerebral infarction without residual deficits: Secondary | ICD-10-CM

## 2021-06-17 NOTE — Patient Instructions (Signed)
It was nice to see you both again. As discussed, I do not see any telltale signs of Parkinson's disease.  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   I do believe you have had slowness and shuffling and trembling.  You have fallen.  Please start using your walker more consistently and continue with therapy.  Please follow-up with your primary neurologist, Dr. Leonie Man and [name], NP on a scheduled basis.

## 2021-06-17 NOTE — Progress Notes (Signed)
Subjective:    Patient ID: Jordan [name] is a 80 y.o. male.  HPI    Interim history:   Jordan [name] is a 80 year old right-handed gentleman with an underlying complex medical history of coronary artery disease, hypertension, hyperlipidemia, gout, arthritis, obesity, sleep apnea, stroke, and diabetes, who presents for reevaluation of his gait disorder.  The patient is accompanied by his wife today.  He is usually followed by Dr. Leonie Man and Frann Rider, and has been followed by Dr. Leonie Man since 2019.  I had evaluated him about a year and a half ago for gait disorder, concern for parkinsonism.  I did not feel he had telltale parkinsonism at the time.  We had talked about his vascular risk factors and he was advised to follow-up with Dr. Leonie Man.  He had a recent visit with Dr. Leonie Man on 05/15/2021 and I reviewed the office note.  He was on Sinemet 1 pill 3 times daily at the time and it was advised that he could take it twice daily.  Today, 06/17/2021: His wife reports that she requested a follow-up with me to evaluate him for Parkinson's disease.  She reports that he has had progressive limb slowness and shuffling in his gait and a stoop in his posture.  He has had back pain and has not been able to stand or walk for any prolonged period of time.  He uses a walking stick.  He is currently in physical therapy.  He has fallen.  He reports that he typically feels off balance and has a tendency to veer backwards.  He fell last week but most of his falls are not complete falls, they are either braced or broken falls.  He did not hit his head.  He is able to stand up on his own if he is able to get on his hands and knees.  She reports that he needs assistance with dressing.  He has not had much in the way of symptom change since his appointment with Dr. Leonie Man.  He does not hydrate very well.  He does like to drink coffee in the morning.  He may drink up to 2 bottles of water per day.  He drinks typically 1 cup of  orange juice per day.  For his back pain he has seen a Restaurant manager, fast food.  Sometimes he feels dizzy when he first sits up or stands up especially when he wakes up in the morning.  He has had sleepiness during the day.  His pulmonologist had placed him on Adderall but he is no longer taking it.  He has sleep apnea and uses his CPAP machine.  The patient's allergies, current medications, family history, past medical history, past social history, past surgical history and problem list were reviewed and updated as appropriate.    Previously:   12/11/19: reports an intermittent tremor in the right hand for the past year, he has had mild difficulty with fine motor control, thankfully no recent falls.  He has had problems with his walking with intermittent shuffling noted.  His wife reports that some days he seems to not pick up his feet very well at all.  He also indicates intermittent trouble with his memory and sometimes he feels confused transiently when he wakes up from a nap or from sleep.  Sometimes it takes him a little bit to get more oriented in his home environment.  He had some visual disturbances, he is scheduled to see an ophthalmologist.  He has had some glare  while driving at night.  He does drive to [place].  He had a brain MRI without contrast on 02/17/2018 and I reviewed the results: IMPRESSION: 1. Punctate foci of acute ischemia within both frontal and parietal lobes and the right cerebellar hemisphere. No associated hemorrhage or mass effect. 2. The distribution of ischemic lesions involves multiple vascular territories and is suggestive of a central cardiac or proximal aortic embolic source. CTA of the neck may be helpful for further assessment. 3. Mild right middle cerebral artery M2 segment irregularity may indicate mild atherosclerotic disease, though the source data images suggest that this might be artifactual. No occlusion or hemodynamically significant stenosis within the  intracranial circulation. 4. Multiple old infarcts, including left frontal cortical infarct, and findings of chronic ischemic microangiopathy.   I reviewed your office note from 11/26/2019.  He reports using his CPAP.  His sleep is fairly restful.  He has not had any sudden onset of one-sided weakness or numbness recently.  Weight has been stable.  He may not always hydrate well with water.  No family history of Parkinson's disease.  His Past Medical History Is Significant For: Past Medical History:  Diagnosis Date   Arthritis    R shoulder, bone spur   Arthritis    "hands" (03/25/2016)   Chronic lower back pain    Coronary heart disease    Dr Pernell Dupre   H/O cardiovascular stress test    perhaps last one was 2009   Heart murmur    12/29/11 echo: mild MR, no AS, trivial TR   History of gout    Hyperlipidemia    Hypertension    saw Dr. Linard Millers last earl;y- 2014, cardiac cath. last done ?2009, blocks seen didn't require any intervention at that point.   Narcolepsy    [name] 04-11-97; Mean Latency 1.55mn, SOREM 2   OSA on CPAP    NPSG 12-13-98 AHI 22.7   PONV (postoperative nausea and vomiting)    Presence of permanent cardiac pacemaker    Shortness of breath    with exertion    Stroke ([facility]    Type II diabetes mellitus ([facility]     His Past Surgical History Is Significant For: Past Surgical History:  Procedure Laterality Date   CARDIAC CATHETERIZATION  2009?   EP IMPLANTABLE DEVICE N/A 03/25/2016   Procedure: Pacemaker Implant - Dual Chamber;  Surgeon: GEvans Lance MD;  [facility];  Service: Cardiovascular;  Laterality: N/A;   FRACTURE SURGERY     INSERT / REPLACE / REMOVE PACEMAKER  03/24/2016   LEFT AND RIGHT HEART CATHETERIZATION WITH CORONARY ANGIOGRAM N/A 06/05/2014   Procedure: LEFT AND RIGHT HEART CATHETERIZATION WITH CORONARY ANGIOGRAM;  Surgeon: HSinclair Grooms MD;  [facility];  Service: Cardiovascular;  Laterality: N/A;   NASAL FRACTURE  SURGERY     SHOULDER ARTHROSCOPY WITH ROTATOR CUFF REPAIR AND SUBACROMIAL DECOMPRESSION Right 08/16/2013   Procedure: SHOULDER ARTHROSCOPY WITH ROTATOR CUFF REPAIR AND SUBACROMIAL DECOMPRESSION;  Surgeon: JNita Sells MD;  [facility]  Service: Orthopedics;  Laterality: Right;  Right shoulder arthroscopy rotator cuff repair, subacromial decompression.   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[employer]   Occupation: [place] [employer]  Tobacco Use   Smoking status: Former    Packs/day: 2.50    Years: 6.00    Pack years: 15.00    Types: Cigarettes    Quit date: 06/05/1967    Years since quitting: 54.0   Smokeless tobacco: Never  Vaping Use   Vaping Use: Never used  Substance and Sexual Activity   Alcohol use: Yes    Comment: rare   Drug use: No   Sexual activity: Not Currently  Other Topics Concern   Not on file  Social History Narrative   Lives with wife and son   Right handed   Drinks 1 cups caffeine daily   Social Determinants of Health   Financial Resource Strain: Not on file  Food Insecurity: Not on file  Transportation Needs: Not on file  Physical Activity: Not on file  Stress: Not on file  Social Connections: Not on file    His Allergies Are:  No Known Allergies:   His Current Medications Are:  Outpatient Encounter Medications as of 06/17/2021  Medication Sig   acetaminophen (TYLENOL) 500 MG tablet Take 1,000 mg by mouth in the morning and at bedtime.   atorvastatin (LIPITOR) 40 MG tablet Take 20 mg by mouth daily.   carbidopa-levodopa (SINEMET)  25-100 MG tablet Take 1 tablet by mouth in the morning and at bedtime. (Patient taking differently: Take 1 tablet by mouth 3 (three) times daily.)   clopidogrel (PLAVIX) 75 MG tablet Take 1 tablet (75 mg total) by mouth daily.   Coenzyme Q10 (CO Q-10 PO) Take 1 capsule by mouth daily.   lisinopril (ZESTRIL) 5 MG tablet Take 5 mg by mouth daily.   loratadine (CLARITIN) 10 MG tablet Take 10 mg by mouth daily.   metFORMIN (GLUCOPHAGE-XR) 500 MG 24 hr tablet Take 4 tablets (2,000 mg total) by mouth daily with breakfast.   Multiple Vitamin (MULTIVITAMIN WITH MINERALS) TABS tablet Take 1 tablet by mouth daily.   NITROSTAT 0.4 MG SL tablet PLACE 1 TABLET (0.4 MG TOTAL) UNDER THE TONGUE EVERY 5 (FIVE) MINUTES AS NEEDED FOR CHEST PAIN. (Patient taking differently: Place 0.4 mg under the tongue every 5 (five) minutes as needed for chest pain.)   Omega-3 Fatty Acids (OMEGA-3 PO) Take 1 capsule by mouth 3 (three) times daily.    PRESCRIPTION MEDICATION See admin instructions. CPAP- At bedtime   sertraline (ZOLOFT) 50 MG tablet 50 mg daily.   amphetamine-dextroamphetamine (ADDERALL XR) 10 MG 24 hr capsule Take 10 mg by mouth daily. (Patient not taking: Reported on 06/17/2021)   amphetamine-dextroamphetamine (ADDERALL) 10 MG tablet Take 10 mg by mouth 2 (two) times daily. (Patient not taking: Reported on 06/17/2021)   betamethasone dipropionate (DIPROLENE) 0.05 % ointment 1 application (Patient not taking: Reported on 06/17/2021)   fluticasone (FLONASE) 50 MCG/ACT nasal spray Place 1 spray into both nostrils daily. (Patient not taking: Reported on 06/17/2021)   No facility-administered encounter medications on file as of 06/17/2021.  :  Review of Systems:  Out of a complete 14 point review of systems, all are reviewed and negative with the exception of these symptoms as listed below:  Review of Systems  Neurological:        Patient is here for worsening balance, ambulation, and tremor in R hand and R foot.  Patient's wife has noticed tremor in L arm on occasion but not as much as the right. He endorses some falls that have resulted in bruising, but no further injuries and he states most of the time he can sit when he falls, rather than fall to the floor.    Objective:  Neurological Exam  Physical Exam Physical Examination:   Vitals:   06/17/21 0829  BP: (!) 152/94  Pulse: 79    General Examination: The patient is a very pleasant 80 y.o. male in no acute distress. He appears well-developed and well-nourished and well groomed.   HEENT: Normocephalic, atraumatic, pupils are equal, round and reactive to light, extraocular tracking well preserved, face is symmetric, no obvious facial masking noted, no lip, neck or jaw tremor.  Neck is supple, no carotid bruits, airway examination reveals moderate mouth dryness, otherwise tongue protrudes centrally in palate elevates symmetrically.No dysarthria or hypophonia noted.  No carotid bruits.   Chest: Clear to auscultation without wheezing, rhonchi or crackles noted.   Heart: S1+S2+0, regular and normal without murmurs, rubs or gallops noted.    Abdomen: Soft, non-tender and non-distended.   Extremities: There is no obvious edema in the distal lower extremities bilaterally.    Skin: Warm and dry without trophic changes noted.   Musculoskeletal: exam reveals no obvious joint deformities.    Neurologically:  Mental status: The patient is awake, alert and oriented in all 4 spheres. His immediate and remote memory, attention, language skills and fund of knowledge are fairly appropriate, some slowness  in thinking.  No dysarthria. Mood is normal and affect is normal.  Cranial nerves II - XII are as described above under HEENT exam. Motor exam: Normal bulk, strength and tone is noted, no obvious resting tremor, no significant postural or action tremor.   Fine motor skills with finger taps, hand movements, rapid alternating padding and foot taps and foot  agility are mildly impaired throughout. No obvious lateralization noted.  He stands up with mild difficulty, initially stands wider based, he can stand narrow based, he has a walking stick but is able to walk without his stick, preserved arm swing noted, no telltale shuffling.   Cerebellar testing: No dysmetria or intention tremor. Sensory exam: intact to light touch.   Posture is mildly stooped forward in the lumbar area.              Assessment and Plan:    In summary, Jordan [name] is a very pleasant 80 year old male with an underlying complex medical history of coronary artery disease, hypertension, hyperlipidemia, gout, arthritis, obesity, sleep apnea, stroke, and diabetes, who presents for re-evaluation of his tremor and gait and posture changes.  He had a recent follow-up with his primary neurologist, Dr. Leonie Man and has been on levodopa therapy, 1 pill 3 times daily, currently at 1 pill twice daily.  Of note, his wife has noted improvement with levodopa therapy.  His history and examination are not telltale for idiopathic Parkinson's disease.  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  Nevertheless, his current presentation is not supportive of idiopathic Parkinson's disease.  Since he has noted benefit from levodopa therapy, I did not suggest he taper off of it.  He can continue with it.  He is advised to stay better hydrated with water.  He is currently in physical therapy.  He is encouraged to use his walker more consistently, he has a 2 wheeled walker at home.  We talked about the importance of fall prevention.  He is advised to keep his follow-up appointment with Dr. Leonie Man, and Frann Rider, NP as scheduled. I answered all their questions today and the patient and his wife are in agreement.

## 2021-06-23 ENCOUNTER — Encounter (HOSPITAL_COMMUNITY): Payer: Self-pay | Admitting: Emergency Medicine

## 2021-06-23 ENCOUNTER — Inpatient Hospital Stay (HOSPITAL_COMMUNITY)
Admission: EM | Admit: 2021-06-23 | Discharge: 2021-07-01 | DRG: 535 | Disposition: A | Discharge status: Skilled Nursing Facility | Payer: Medicare HMO | Attending: Internal Medicine | Admitting: Internal Medicine

## 2021-06-23 ENCOUNTER — Emergency Department (HOSPITAL_COMMUNITY): Payer: Medicare HMO

## 2021-06-23 ENCOUNTER — Other Ambulatory Visit: Payer: Self-pay

## 2021-06-23 DIAGNOSIS — Z8673 Personal history of transient ischemic attack (TIA), and cerebral infarction without residual deficits: Secondary | ICD-10-CM | POA: Diagnosis not present

## 2021-06-23 DIAGNOSIS — Z800 Family history of malignant neoplasm of digestive organs: Secondary | ICD-10-CM

## 2021-06-23 DIAGNOSIS — S32402A Unspecified fracture of left acetabulum, initial encounter for closed fracture: Secondary | ICD-10-CM | POA: Diagnosis present

## 2021-06-23 DIAGNOSIS — S32409A Unspecified fracture of unspecified acetabulum, initial encounter for closed fracture: Secondary | ICD-10-CM | POA: Diagnosis present

## 2021-06-23 DIAGNOSIS — E1165 Type 2 diabetes mellitus with hyperglycemia: Secondary | ICD-10-CM | POA: Diagnosis not present

## 2021-06-23 DIAGNOSIS — R41841 Cognitive communication deficit: Secondary | ICD-10-CM | POA: Diagnosis not present

## 2021-06-23 DIAGNOSIS — E7849 Other hyperlipidemia: Secondary | ICD-10-CM | POA: Diagnosis not present

## 2021-06-23 DIAGNOSIS — Z6833 Body mass index (BMI) 33.0-33.9, adult: Secondary | ICD-10-CM | POA: Diagnosis not present

## 2021-06-23 DIAGNOSIS — S32592A Other specified fracture of left pubis, initial encounter for closed fracture: Secondary | ICD-10-CM | POA: Diagnosis not present

## 2021-06-23 DIAGNOSIS — E559 Vitamin D deficiency, unspecified: Secondary | ICD-10-CM | POA: Diagnosis present

## 2021-06-23 DIAGNOSIS — G9341 Metabolic encephalopathy: Secondary | ICD-10-CM | POA: Diagnosis not present

## 2021-06-23 DIAGNOSIS — I2510 Atherosclerotic heart disease of native coronary artery without angina pectoris: Secondary | ICD-10-CM | POA: Diagnosis present

## 2021-06-23 DIAGNOSIS — I5032 Chronic diastolic (congestive) heart failure: Secondary | ICD-10-CM | POA: Diagnosis not present

## 2021-06-23 DIAGNOSIS — S3289XA Fracture of other parts of pelvis, initial encounter for closed fracture: Secondary | ICD-10-CM | POA: Diagnosis not present

## 2021-06-23 DIAGNOSIS — S32392A Other fracture of left ilium, initial encounter for closed fracture: Secondary | ICD-10-CM | POA: Diagnosis present

## 2021-06-23 DIAGNOSIS — Z20822 Contact with and (suspected) exposure to covid-19: Secondary | ICD-10-CM | POA: Diagnosis not present

## 2021-06-23 DIAGNOSIS — S32302A Unspecified fracture of left ilium, initial encounter for closed fracture: Secondary | ICD-10-CM | POA: Diagnosis not present

## 2021-06-23 DIAGNOSIS — E1151 Type 2 diabetes mellitus with diabetic peripheral angiopathy without gangrene: Secondary | ICD-10-CM

## 2021-06-23 DIAGNOSIS — Z87891 Personal history of nicotine dependence: Secondary | ICD-10-CM | POA: Diagnosis not present

## 2021-06-23 DIAGNOSIS — Z9989 Dependence on other enabling machines and devices: Secondary | ICD-10-CM | POA: Diagnosis not present

## 2021-06-23 DIAGNOSIS — D72828 Other elevated white blood cell count: Secondary | ICD-10-CM | POA: Diagnosis not present

## 2021-06-23 DIAGNOSIS — E1169 Type 2 diabetes mellitus with other specified complication: Secondary | ICD-10-CM

## 2021-06-23 DIAGNOSIS — Z79899 Other long term (current) drug therapy: Secondary | ICD-10-CM

## 2021-06-23 DIAGNOSIS — S32492A Other specified fracture of left acetabulum, initial encounter for closed fracture: Secondary | ICD-10-CM | POA: Diagnosis not present

## 2021-06-23 DIAGNOSIS — M25552 Pain in left hip: Secondary | ICD-10-CM | POA: Diagnosis present

## 2021-06-23 DIAGNOSIS — G4733 Obstructive sleep apnea (adult) (pediatric): Secondary | ICD-10-CM | POA: Diagnosis not present

## 2021-06-23 DIAGNOSIS — W19XXXA Unspecified fall, initial encounter: Secondary | ICD-10-CM | POA: Diagnosis not present

## 2021-06-23 DIAGNOSIS — Z20818 Contact with and (suspected) exposure to other bacterial communicable diseases: Secondary | ICD-10-CM | POA: Diagnosis not present

## 2021-06-23 DIAGNOSIS — M545 Low back pain, unspecified: Secondary | ICD-10-CM | POA: Diagnosis not present

## 2021-06-23 DIAGNOSIS — K5909 Other constipation: Secondary | ICD-10-CM | POA: Diagnosis present

## 2021-06-23 DIAGNOSIS — R4182 Altered mental status, unspecified: Secondary | ICD-10-CM | POA: Diagnosis not present

## 2021-06-23 DIAGNOSIS — D72829 Elevated white blood cell count, unspecified: Secondary | ICD-10-CM | POA: Diagnosis present

## 2021-06-23 DIAGNOSIS — E119 Type 2 diabetes mellitus without complications: Secondary | ICD-10-CM

## 2021-06-23 DIAGNOSIS — W010XXA Fall on same level from slipping, tripping and stumbling without subsequent striking against object, initial encounter: Secondary | ICD-10-CM | POA: Diagnosis present

## 2021-06-23 DIAGNOSIS — I10 Essential (primary) hypertension: Secondary | ICD-10-CM | POA: Diagnosis present

## 2021-06-23 DIAGNOSIS — S32512A Fracture of superior rim of left pubis, initial encounter for closed fracture: Secondary | ICD-10-CM | POA: Diagnosis not present

## 2021-06-23 DIAGNOSIS — F05 Delirium due to known physiological condition: Secondary | ICD-10-CM | POA: Diagnosis not present

## 2021-06-23 DIAGNOSIS — Z7984 Long term (current) use of oral hypoglycemic drugs: Secondary | ICD-10-CM

## 2021-06-23 DIAGNOSIS — Z950 Presence of cardiac pacemaker: Secondary | ICD-10-CM

## 2021-06-23 DIAGNOSIS — I110 Hypertensive heart disease with heart failure: Secondary | ICD-10-CM | POA: Diagnosis present

## 2021-06-23 DIAGNOSIS — G20 Parkinson's disease: Secondary | ICD-10-CM | POA: Diagnosis present

## 2021-06-23 DIAGNOSIS — Z7401 Bed confinement status: Secondary | ICD-10-CM | POA: Diagnosis not present

## 2021-06-23 DIAGNOSIS — E669 Obesity, unspecified: Secondary | ICD-10-CM | POA: Diagnosis present

## 2021-06-23 DIAGNOSIS — E785 Hyperlipidemia, unspecified: Secondary | ICD-10-CM | POA: Diagnosis present

## 2021-06-23 DIAGNOSIS — S301XXA Contusion of abdominal wall, initial encounter: Secondary | ICD-10-CM | POA: Diagnosis not present

## 2021-06-23 DIAGNOSIS — S32472S Displaced fracture of medial wall of left acetabulum, sequela: Secondary | ICD-10-CM | POA: Diagnosis not present

## 2021-06-23 DIAGNOSIS — R102 Pelvic and perineal pain: Secondary | ICD-10-CM | POA: Diagnosis present

## 2021-06-23 DIAGNOSIS — S300XXA Contusion of lower back and pelvis, initial encounter: Secondary | ICD-10-CM | POA: Diagnosis present

## 2021-06-23 DIAGNOSIS — Z7902 Long term (current) use of antithrombotics/antiplatelets: Secondary | ICD-10-CM | POA: Diagnosis not present

## 2021-06-23 DIAGNOSIS — Y92008 Other place in unspecified non-institutional (private) residence as the place of occurrence of the external cause: Secondary | ICD-10-CM

## 2021-06-23 DIAGNOSIS — Z794 Long term (current) use of insulin: Secondary | ICD-10-CM

## 2021-06-23 DIAGNOSIS — M6281 Muscle weakness (generalized): Secondary | ICD-10-CM | POA: Diagnosis not present

## 2021-06-23 DIAGNOSIS — S32472D Displaced fracture of medial wall of left acetabulum, subsequent encounter for fracture with routine healing: Secondary | ICD-10-CM | POA: Diagnosis not present

## 2021-06-23 DIAGNOSIS — S32472A Displaced fracture of medial wall of left acetabulum, initial encounter for closed fracture: Secondary | ICD-10-CM

## 2021-06-23 DIAGNOSIS — E1159 Type 2 diabetes mellitus with other circulatory complications: Secondary | ICD-10-CM | POA: Diagnosis not present

## 2021-06-23 DIAGNOSIS — R2681 Unsteadiness on feet: Secondary | ICD-10-CM | POA: Diagnosis not present

## 2021-06-23 DIAGNOSIS — R269 Unspecified abnormalities of gait and mobility: Secondary | ICD-10-CM | POA: Diagnosis not present

## 2021-06-23 DIAGNOSIS — R262 Difficulty in walking, not elsewhere classified: Secondary | ICD-10-CM | POA: Diagnosis not present

## 2021-06-23 DIAGNOSIS — Y92009 Unspecified place in unspecified non-institutional (private) residence as the place of occurrence of the external cause: Secondary | ICD-10-CM | POA: Diagnosis not present

## 2021-06-23 DIAGNOSIS — J069 Acute upper respiratory infection, unspecified: Secondary | ICD-10-CM | POA: Diagnosis not present

## 2021-06-23 HISTORY — DX: Chronic diastolic (congestive) heart failure: I50.32

## 2021-06-23 NOTE — ED Notes (Signed)
Attempted to ambulate pt. Pt was able to sit on the side of bed with some discomfort. Pt was not able to bear weight on left leg to ambulate. EDP aware

## 2021-06-23 NOTE — ED Provider Notes (Signed)
[facility] EMERGENCY DEPARTMENT Provider Note   CSN: [id number] Arrival date & time: 06/23/21  1906     History Chief Complaint  Patient presents with   [name]    Jordan [name] is a 80 y.o. male.  HPI Patient presents via EMS after mechanical fall with pain in his left hip.  He has multiple medical issues, but states that he was generally well until mechanical fall that occurred just prior to EMS notification.  Since that time he has had pain persistently in the left hip.  He has transiently been able to bear weight, but been unable to ambulate secondary to pain. No medication for relief, and the pain is better when completely resting. No other injuries, no other trauma, no other complaints. EMS reports no hemodynamic instability in route patient was awake and alert throughout.    Past Medical History:  Diagnosis Date   Arthritis    R shoulder, bone spur   Arthritis    "hands" (03/25/2016)   Chronic lower back pain    Coronary heart disease    Dr Pernell Dupre   H/O cardiovascular stress test    perhaps last one was 2009   Heart murmur    12/29/11 echo: mild MR, no AS, trivial TR   History of gout    Hyperlipidemia    Hypertension    saw Dr. Linard Millers last earl;y- 2014, cardiac cath. last done ?2009, blocks seen didn't require any intervention at that point.   Narcolepsy    [name] 04-11-97; Mean Latency 1.55mn, SOREM 2   OSA on CPAP    NPSG 12-13-98 AHI 22.7   PONV (postoperative nausea and vomiting)    Presence of permanent cardiac pacemaker    Shortness of breath    with exertion    Stroke ([facility]    Type II diabetes mellitus ([place]     Patient Active Problem List   Diagnosis Date Noted   Sinusitis, acute maxillary 06/24/2020   AKI (acute kidney injury) ([place]    Labile blood glucose    Cough    Benign essential HTN    Skin lesion of hand    Hypoalbuminemia due to protein-calorie malnutrition ([facility]    Controlled type 2 diabetes mellitus with  hyperglycemia, without long-term current use of insulin ([place]    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   Morbid obesity ([facility]    Acute blood loss anemia    Acute thalamic infarction ([facility] 04/15/2020   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[facility] 05/25/2019   TIA (transient ischemic attack) 02/17/2018   DM2 (diabetes mellitus, type 2) ([facility] 02/17/2018   Insomnia 02/01/2018   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   Chronotropic incompetence 07/09/2014   Dyspnea on exertion 03/03/2014   HLD (hyperlipidemia) 12/13/2007   Essential hypertension 12/13/2007   OBESITY 12/05/2007   OSA on CPAP 12/05/2007   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   EP IMPLANTABLE DEVICE N/A 03/25/2016   Procedure: Pacemaker Implant - Dual Chamber;  Surgeon: GEvans Lance MD;  [facility];  Service: Cardiovascular;  Laterality: N/A;   FRACTURE SURGERY     INSERT / REPLACE / REMOVE PACEMAKER  03/24/2016   LEFT AND RIGHT HEART CATHETERIZATION WITH CORONARY ANGIOGRAM N/A 06/05/2014   Procedure: LEFT AND RIGHT HEART CATHETERIZATION WITH CORONARY ANGIOGRAM;  Surgeon: HSinclair Grooms MD;  Location: [facility];  Service: Cardiovascular;  Laterality: N/A;   NASAL FRACTURE SURGERY     SHOULDER ARTHROSCOPY WITH ROTATOR CUFF REPAIR AND SUBACROMIAL DECOMPRESSION Right 08/16/2013   Procedure: SHOULDER ARTHROSCOPY WITH ROTATOR CUFF REPAIR AND SUBACROMIAL DECOMPRESSION;  Surgeon: [name], MD;  Location: [facility];  Service: Orthopedics;  Laterality: Right;  Right shoulder arthroscopy rotator cuff repair, subacromial decompression.   TONSILLECTOMY         Family History  Problem Relation Age of Onset   Sleep apnea Sister    Colon cancer Sister     Social History   Tobacco Use   Smoking status: Former    Packs/day: 2.50    Years: 6.00     Pack years: 15.00    Types: Cigarettes    Quit date: 06/05/1967    Years since quitting: 54.0   Smokeless tobacco: Never  Vaping Use   Vaping Use: Never used  Substance Use Topics   Alcohol use: Yes    Comment: rare   Drug use: No    Home Medications Prior to Admission medications   Medication Sig Start Date End Date Taking? Authorizing Provider  acetaminophen (TYLENOL) 500 MG tablet Take 1,000 mg by mouth in the morning and at bedtime.    [provider]  amphetamine-dextroamphetamine (ADDERALL XR) 10 MG 24 hr capsule Take 10 mg by mouth daily. Patient not taking: Reported on 06/17/2021    [provider]  amphetamine-dextroamphetamine (ADDERALL) 10 MG tablet Take 10 mg by mouth 2 (two) times daily. Patient not taking: Reported on 06/17/2021    [provider]  atorvastatin (LIPITOR) 40 MG tablet Take 20 mg by mouth daily. 05/03/20   [provider]  betamethasone dipropionate (DIPROLENE) 0.05 % ointment 1 application Patient not taking: Reported on 06/17/2021 11/14/20   [provider]  carbidopa-levodopa (SINEMET) 25-100 MG tablet Take 1 tablet by mouth in the morning and at bedtime. Patient taking differently: Take 1 tablet by mouth 3 (three) times daily. 05/12/21   Garvin Fila, MD  clopidogrel (PLAVIX) 75 MG tablet Take 1 tablet (75 mg total) by mouth daily. 04/29/20   Love, Ivan Anchors, PA-C  Coenzyme Q10 (CO Q-10 PO) Take 1 capsule by mouth daily.    [provider]  fluticasone (FLONASE) 50 MCG/ACT nasal spray Place 1 spray into both nostrils daily. Patient not taking: Reported on 06/17/2021 06/24/20   Lauraine Rinne, NP  lisinopril (ZESTRIL) 5 MG tablet Take 5 mg by mouth daily. 11/14/20   [provider]  loratadine (CLARITIN) 10 MG tablet Take 10 mg by mouth daily.    [provider]  metFORMIN (GLUCOPHAGE-XR) 500 MG 24 hr tablet Take 4 tablets (2,000 mg total) by mouth daily with breakfast. 04/29/20   Love, Ivan Anchors, PA-C  Multiple Vitamin (MULTIVITAMIN WITH MINERALS) TABS tablet Take 1 tablet by mouth daily. 02/19/18   Georgette Shell, MD  NITROSTAT 0.4 MG SL tablet PLACE 1 TABLET (0.4 MG TOTAL) UNDER THE TONGUE EVERY 5 (FIVE) MINUTES AS NEEDED FOR CHEST PAIN. Patient taking differently: Place 0.4 mg under the tongue every 5 (five) minutes as needed for chest pain. 03/31/16   Belva Crome, MD  Omega-3 Fatty Acids (OMEGA-3 PO) Take 1 capsule by mouth 3 (three) times daily.     [provider]  PRESCRIPTION MEDICATION See admin instructions. CPAP- At bedtime    [provider]  sertraline (ZOLOFT) 50 MG tablet 50 mg daily.  [provider]    Allergies    Patient has no known allergies.  Review of Systems   Review of Systems  Constitutional:        Per HPI, otherwise negative  HENT:         Per HPI, otherwise negative  Respiratory:         Per HPI, otherwise negative  Cardiovascular:        Per HPI, otherwise negative  Gastrointestinal:  Negative for vomiting.  Endocrine:       Negative aside from HPI  Genitourinary:        Neg aside from HPI   Musculoskeletal:        Per HPI, otherwise negative  Skin: Negative.   Neurological:  Negative for syncope.   Physical Exam Updated Vital Signs BP 140/84   Pulse 78   Temp 98.8 F (37.1 C)   Resp (!) 21   SpO2 93%   Physical Exam Vitals and nursing note reviewed.  Constitutional:      General: He is not in acute distress.    Appearance: He is well-developed.  HENT:     Head: Normocephalic and atraumatic.  Eyes:     Conjunctiva/sclera: Conjunctivae normal.  Cardiovascular:     Rate and Rhythm: Normal rate and regular rhythm.  Pulmonary:     Effort: Pulmonary effort is normal. No respiratory distress.     Breath sounds: No stridor.  Abdominal:     General: There is no distension.  Musculoskeletal:     Comments: Musculoskeletal exam beyond left hip unremarkable.  Left knee unremarkable.  Patient is  tender to palpation left lateral hip, unwilling to flex it secondary to pain in this area.  Skin:    General: Skin is warm and dry.  Neurological:     Mental Status: He is alert and oriented to person, place, and time.    ED Results / Procedures / Treatments   Labs (all labs ordered are listed, but only abnormal results are displayed) Labs Reviewed  SARS CORONAVIRUS 2 (TAT 6-24 HRS)  BASIC METABOLIC PANEL  CBC WITH DIFFERENTIAL/PLATELET    EKG None  Radiology CT PELVIS WO CONTRAST  Result Date: 06/23/2021 CLINICAL DATA:  Pelvic trauma. EXAM: CT PELVIS WITHOUT CONTRAST TECHNIQUE: Multidetector CT imaging of the pelvis was performed following the standard protocol without intravenous contrast. COMPARISON:  Pelvic radiograph dated 06/23/2021. FINDINGS: Urinary Tract: The visualized distal ureters and urinary bladder appear unremarkable. Bowel: Sigmoid diverticulosis. No bowel dilatation within the pelvis. The appendix is unremarkable. Vascular/Lymphatic: Moderate atherosclerotic calcification. No pelvic adenopathy. Reproductive: The prostate and seminal vesicles are grossly unremarkable. No pelvic mass. Other:  None Musculoskeletal: There is a comminuted mildly displaced fracture of the proximal left superior pubic ramus extending into the anterior column and medial wall of the acetabulum. There is a nondisplaced fracture of the posterior left acetabular roofs contiguous with fracture of the left iliac bone. There is a nondisplaced fracture of the left superior pubic ramus adjacent to the symphysis previous. No femoral fractures. The bones are osteopenic. Degenerative changes of the lower lumbar spine. There is a small pelvic hematoma to the left of the bladder and along the left pelvic sidewall. There is mild mass effect on the bladder. IMPRESSION: 1. Comminuted mildly displaced fracture of the left acetabulum extending into the left iliac bone and proximal left superior pubic ramus. Additional  fracture of the distal left superior pubic ramus adjacent to the symphysis previous. No femoral fracture. 2.  Small pelvic hematoma to the left of the bladder and along the left pelvic sidewall. 3. Aortic Atherosclerosis (ICD10-I70.0). Electronically Signed   By: Anner Crete M.D.   On: 06/23/2021 22:58   DG Hip Unilat With Pelvis 2-3 Views Left  Result Date: 06/23/2021 CLINICAL DATA:  Left hip pain after fall EXAM: DG HIP (WITH OR WITHOUT PELVIS) 2-3V LEFT COMPARISON:  None. FINDINGS: Frontal view of the pelvis as well as frontal and frogleg lateral views of the left hip are obtained. No acute fracture, subluxation, or dislocation. Mild symmetrical bilateral hip osteoarthritis. Sacroiliac joints are normal. Soft tissues are unremarkable. IMPRESSION: 1. Osteoarthritis.  No acute fracture. Electronically Signed   By: Randa Ngo M.D.   On: 06/23/2021 19:42    Procedures Procedures   Medications Ordered in ED Medications - No data to display  ED Course  I have reviewed the triage vital signs and the nursing notes.  Pertinent labs & imaging results that were available during my care of the patient were reviewed by me and considered in my medical decision making (see chart for details).  Update: On repeat exam patient is awake, alert, in no distress.  However, the patient has failed to ambulate without pain, CT scan pending.  11:15 PM  On repeat exam patient is in no distress, we discussed CT which I reviewed, and discussed with his wife via telephone. Patient found to have comminuted displaced fracture left acetabulum with progression to the left iliac bone, left superior pubic rami.  Given these abnormalities patient will require admission, discussion with orthopedic team, patient aware.  MDM Rules/Calculators/A&P   MDM Number of Diagnoses or Management Options Closed displaced fracture of medial wall of left acetabulum, initial encounter [facility]): new, needed workup Fall, initial  encounter: new, needed workup   Amount and/or Complexity of Data Reviewed Clinical lab tests: ordered and reviewed Tests in the radiology section of CPT: ordered and reviewed Tests in the medicine section of CPT: reviewed and ordered Decide to obtain previous medical records or to obtain history from someone other than the patient: yes Review and summarize past medical records: yes Discuss the patient with other providers: yes Independent visualization of images, tracings, or specimens: yes  Risk of Complications, Morbidity, and/or Mortality Presenting problems: high Diagnostic procedures: high Management options: high  Critical Care Total time providing critical care: < 30 minutes  Patient Progress Patient progress: stable   Final Clinical Impression(s) / ED Diagnoses Final diagnoses:  Fall, initial encounter  Closed displaced fracture of medial wall of left acetabulum, initial encounter ([place])     Carmin Muskrat, MD 06/23/21 [id number]

## 2021-06-23 NOTE — ED Triage Notes (Signed)
[facility] - pt fell after losing his balance. Pt states that he didn't hit his head, but he is having left hip pain. Pt has hx of stroke and frequently loses his balance and dizziness

## 2021-06-24 ENCOUNTER — Encounter (HOSPITAL_COMMUNITY): Payer: Self-pay | Admitting: Internal Medicine

## 2021-06-24 DIAGNOSIS — M25552 Pain in left hip: Secondary | ICD-10-CM | POA: Diagnosis present

## 2021-06-24 DIAGNOSIS — Z9989 Dependence on other enabling machines and devices: Secondary | ICD-10-CM

## 2021-06-24 DIAGNOSIS — I10 Essential (primary) hypertension: Secondary | ICD-10-CM

## 2021-06-24 DIAGNOSIS — I5032 Chronic diastolic (congestive) heart failure: Secondary | ICD-10-CM | POA: Diagnosis not present

## 2021-06-24 DIAGNOSIS — G4733 Obstructive sleep apnea (adult) (pediatric): Secondary | ICD-10-CM | POA: Diagnosis not present

## 2021-06-24 DIAGNOSIS — D72829 Elevated white blood cell count, unspecified: Secondary | ICD-10-CM | POA: Diagnosis present

## 2021-06-24 DIAGNOSIS — S32409A Unspecified fracture of unspecified acetabulum, initial encounter for closed fracture: Secondary | ICD-10-CM | POA: Diagnosis present

## 2021-06-24 DIAGNOSIS — E7849 Other hyperlipidemia: Secondary | ICD-10-CM | POA: Diagnosis not present

## 2021-06-24 DIAGNOSIS — W19XXXA Unspecified fall, initial encounter: Secondary | ICD-10-CM | POA: Diagnosis not present

## 2021-06-24 DIAGNOSIS — E1165 Type 2 diabetes mellitus with hyperglycemia: Secondary | ICD-10-CM | POA: Diagnosis not present

## 2021-06-24 DIAGNOSIS — S32472A Displaced fracture of medial wall of left acetabulum, initial encounter for closed fracture: Secondary | ICD-10-CM | POA: Diagnosis not present

## 2021-06-24 DIAGNOSIS — E1159 Type 2 diabetes mellitus with other circulatory complications: Secondary | ICD-10-CM | POA: Diagnosis not present

## 2021-06-24 DIAGNOSIS — Y92009 Unspecified place in unspecified non-institutional (private) residence as the place of occurrence of the external cause: Secondary | ICD-10-CM

## 2021-06-24 DIAGNOSIS — S32402A Unspecified fracture of left acetabulum, initial encounter for closed fracture: Secondary | ICD-10-CM | POA: Diagnosis not present

## 2021-06-24 LAB — CBC WITH DIFFERENTIAL/PLATELET
Abs Immature Granulocytes: 0.06 10*3/uL (ref 0.00–0.07)
Abs Immature Granulocytes: 0.07 10*3/uL (ref 0.00–0.07)
Basophils Absolute: 0.1 10*3/uL (ref 0.0–0.1)
Basophils Absolute: 0.1 10*3/uL (ref 0.0–0.1)
Basophils Relative: 0 %
Basophils Relative: 0 %
Eosinophils Absolute: 0.2 10*3/uL (ref 0.0–0.5)
Eosinophils Absolute: 0.2 10*3/uL (ref 0.0–0.5)
Eosinophils Relative: 1 %
Eosinophils Relative: 1 %
HCT: 41.4 % (ref 39.0–52.0)
HCT: 41.7 % (ref 39.0–52.0)
Hemoglobin: 13.4 g/dL (ref 13.0–17.0)
Hemoglobin: 13.7 g/dL (ref 13.0–17.0)
Immature Granulocytes: 0 %
Immature Granulocytes: 1 %
Lymphocytes Relative: 15 %
Lymphocytes Relative: 16 %
Lymphs Abs: 2.1 10*3/uL (ref 0.7–4.0)
Lymphs Abs: 2.2 10*3/uL (ref 0.7–4.0)
MCH: 30 pg (ref 26.0–34.0)
MCH: 30.4 pg (ref 26.0–34.0)
MCHC: 32.1 g/dL (ref 30.0–36.0)
MCHC: 33.1 g/dL (ref 30.0–36.0)
MCV: 91.8 fL (ref 80.0–100.0)
MCV: 93.3 fL (ref 80.0–100.0)
Monocytes Absolute: 1 10*3/uL (ref 0.1–1.0)
Monocytes Absolute: 1.1 10*3/uL — ABNORMAL HIGH (ref 0.1–1.0)
Monocytes Relative: 7 %
Monocytes Relative: 7 %
Neutro Abs: 10.5 10*3/uL — ABNORMAL HIGH (ref 1.7–7.7)
Neutro Abs: 10.9 10*3/uL — ABNORMAL HIGH (ref 1.7–7.7)
Neutrophils Relative %: 75 %
Neutrophils Relative %: 77 %
Platelets: 219 10*3/uL (ref 150–400)
Platelets: 219 10*3/uL (ref 150–400)
RBC: 4.47 MIL/uL (ref 4.22–5.81)
RBC: 4.51 MIL/uL (ref 4.22–5.81)
RDW: 13.4 % (ref 11.5–15.5)
RDW: 13.4 % (ref 11.5–15.5)
WBC: 14 10*3/uL — ABNORMAL HIGH (ref 4.0–10.5)
WBC: 14.4 10*3/uL — ABNORMAL HIGH (ref 4.0–10.5)
nRBC: 0 % (ref 0.0–0.2)
nRBC: 0 % (ref 0.0–0.2)

## 2021-06-24 LAB — COMPREHENSIVE METABOLIC PANEL
ALT: 19 U/L (ref 0–44)
AST: 20 U/L (ref 15–41)
Albumin: 3.5 g/dL (ref 3.5–5.0)
Alkaline Phosphatase: 50 U/L (ref 38–126)
Anion gap: 8 (ref 5–15)
BUN: 15 mg/dL (ref 8–23)
CO2: 23 mmol/L (ref 22–32)
Calcium: 9.6 mg/dL (ref 8.9–10.3)
Chloride: 107 mmol/L (ref 98–111)
Creatinine, Ser: 0.98 mg/dL (ref 0.61–1.24)
GFR, Estimated: >60 mL/min (ref >60)
Glucose, Bld: 128 mg/dL — ABNORMAL HIGH (ref 70–99)
Potassium: 4.4 mmol/L (ref 3.5–5.1)
Sodium: 138 mmol/L (ref 135–145)
Total Bilirubin: 1.1 mg/dL (ref 0.3–1.2)
Total Protein: 6.3 g/dL — ABNORMAL LOW (ref 6.5–8.1)

## 2021-06-24 LAB — URINALYSIS, ROUTINE W REFLEX MICROSCOPIC
Bacteria, UA: NONE SEEN
Bilirubin Urine: NEGATIVE
Glucose, UA: NEGATIVE mg/dL
Ketones, ur: NEGATIVE mg/dL
Leukocytes,Ua: NEGATIVE
Nitrite: NEGATIVE
Protein, ur: NEGATIVE mg/dL
RBC / HPF: >50 RBC/hpf — ABNORMAL HIGH (ref 0–5)
Specific Gravity, Urine: 1.019 (ref 1.005–1.030)
pH: 5 (ref 5.0–8.0)

## 2021-06-24 LAB — CBG MONITORING, ED
Glucose-Capillary: 120 mg/dL — ABNORMAL HIGH (ref 70–99)
Glucose-Capillary: 118 mg/dL — ABNORMAL HIGH (ref 70–99)

## 2021-06-24 LAB — BASIC METABOLIC PANEL
Anion gap: 9 (ref 5–15)
BUN: 15 mg/dL (ref 8–23)
CO2: 23 mmol/L (ref 22–32)
Calcium: 9.4 mg/dL (ref 8.9–10.3)
Chloride: 106 mmol/L (ref 98–111)
Creatinine, Ser: 0.96 mg/dL (ref 0.61–1.24)
GFR, Estimated: >60 mL/min (ref >60)
Glucose, Bld: 124 mg/dL — ABNORMAL HIGH (ref 70–99)
Potassium: 4.3 mmol/L (ref 3.5–5.1)
Sodium: 138 mmol/L (ref 135–145)

## 2021-06-24 LAB — GLUCOSE, CAPILLARY
Glucose-Capillary: 136 mg/dL — ABNORMAL HIGH (ref 70–99)
Glucose-Capillary: 141 mg/dL — ABNORMAL HIGH (ref 70–99)

## 2021-06-24 LAB — MAGNESIUM: Magnesium: 1.4 mg/dL — ABNORMAL LOW (ref 1.7–2.4)

## 2021-06-24 LAB — SARS CORONAVIRUS 2 (TAT 6-24 HRS): SARS Coronavirus 2: NEGATIVE

## 2021-06-24 LAB — PROTIME-INR
INR: 1.1 (ref 0.8–1.2)
Prothrombin Time: 14.6 seconds (ref 11.4–15.2)

## 2021-06-24 MED ORDER — FENTANYL CITRATE (PF) 100 MCG/2ML IJ SOLN
25.0000 ug | INTRAMUSCULAR | Status: DC | PRN
Start: 2021-06-24 — End: 2021-06-29

## 2021-06-24 MED ORDER — ONDANSETRON HCL 4 MG/2ML IJ SOLN: 4.0000 mg | Freq: Four times a day (QID) | INTRAMUSCULAR | Status: DC | PRN

## 2021-06-24 MED ORDER — ATORVASTATIN CALCIUM 10 MG PO TABS
20.0000 mg | ORAL_TABLET | Freq: Every day | ORAL | Status: DC
  Administered 2021-06-24 – 2021-07-01 (×8): 20 mg via ORAL
  Filled 2021-06-24 (×8): qty 2

## 2021-06-24 MED ORDER — ACETAMINOPHEN 650 MG RE SUPP: 650.0000 mg | Freq: Four times a day (QID) | RECTAL | Status: DC | PRN

## 2021-06-24 MED ORDER — NALOXONE HCL 0.4 MG/ML IJ SOLN: 0.4000 mg | INTRAMUSCULAR | Status: DC | PRN

## 2021-06-24 MED ORDER — CARBIDOPA-LEVODOPA 25-100 MG PO TABS
1.0000 | ORAL_TABLET | Freq: Three times a day (TID) | ORAL | Status: DC
Start: 2021-06-24 — End: 2021-07-01
  Administered 2021-06-24 – 2021-07-01 (×21): 1 via ORAL
  Filled 2021-06-24 (×21): qty 1

## 2021-06-24 MED ORDER — ACETAMINOPHEN 325 MG PO TABS: 650.0000 mg | ORAL_TABLET | Freq: Four times a day (QID) | ORAL | Status: DC | PRN

## 2021-06-24 MED ORDER — INSULIN ASPART 100 UNIT/ML IJ SOLN
0.0000 [IU] | Freq: Four times a day (QID) | INTRAMUSCULAR | Status: DC
  Administered 2021-06-26 – 2021-06-28 (×3): 1 [IU] via SUBCUTANEOUS
  Administered 2021-06-28: 2 [IU] via SUBCUTANEOUS

## 2021-06-24 MED ORDER — SERTRALINE HCL 50 MG PO TABS
50.0000 mg | ORAL_TABLET | Freq: Every day | ORAL | Status: DC
  Administered 2021-06-24 – 2021-06-29 (×6): 50 mg via ORAL
  Filled 2021-06-24 (×6): qty 1

## 2021-06-24 MED ORDER — KETOROLAC TROMETHAMINE 15 MG/ML IJ SOLN
15.0000 mg | Freq: Once | INTRAMUSCULAR | Status: AC
  Administered 2021-06-24: 15 mg via INTRAVENOUS
  Filled 2021-06-24: qty 1

## 2021-06-24 MED ORDER — AMPHETAMINE-DEXTROAMPHETAMINE 10 MG PO TABS
10.0000 mg | ORAL_TABLET | Freq: Every day | ORAL | Status: DC
  Administered 2021-06-25 – 2021-07-01 (×7): 10 mg via ORAL
  Filled 2021-06-24 (×7): qty 1

## 2021-06-24 MED ORDER — MAGNESIUM SULFATE 4 GM/100ML IV SOLN
4.0000 g | Freq: Once | INTRAVENOUS | Status: AC
  Administered 2021-06-24: 4 g via INTRAVENOUS
  Filled 2021-06-24: qty 100

## 2021-06-24 MED ORDER — FENTANYL CITRATE PF 50 MCG/ML IJ SOSY: 25.0000 ug | PREFILLED_SYRINGE | INTRAMUSCULAR | Status: DC | PRN

## 2021-06-24 NOTE — Consult Note (Signed)
Reason for Consult:Left acetabular fx Referring Physician: Oren Binet Time called: [id number] Time at bedside: 1126   Jordan [name] is an 80 y.o. male.  HPI: Jordan [name] was getting out of a car and lost his balance and fell. He had immediate pain in his left hip. He was brought to the ED where x-rays showed an acetabulum fx and orthopedic surgery was consulted. He usually ambulates with a walking stick but also has a rollator.  Past Medical History:  Diagnosis Date   Arthritis    R shoulder, bone spur   Arthritis    "hands" (03/25/2016)   Chronic diastolic heart failure (HCC)    Chronic lower back pain    Coronary heart disease    Dr Pernell Dupre   H/O cardiovascular stress test    perhaps last one was 2009   Heart murmur    12/29/11 echo: mild MR, no AS, trivial TR   History of gout    Hyperlipidemia    Hypertension    saw Dr. Linard Millers last earl;y- 2014, cardiac cath. last done ?2009, blocks seen didn't require any intervention at that point.   Narcolepsy    [name] 04-11-97; Mean Latency 1.83mn, SOREM 2   OSA on CPAP    NPSG 12-13-98 AHI 22.7   PONV (postoperative nausea and vomiting)    Presence of permanent cardiac pacemaker    Shortness of breath    with exertion    Stroke ([facility]    Type II diabetes mellitus ([facility]     Past Surgical History:  Procedure Laterality Date   CARDIAC CATHETERIZATION  2009?   EP IMPLANTABLE DEVICE N/A 03/25/2016   Procedure: Pacemaker Implant - Dual Chamber;  Surgeon: GEvans Lance MD;  [facility];  Service: Cardiovascular;  Laterality: N/A;   FRACTURE SURGERY     INSERT / REPLACE / REMOVE PACEMAKER  03/24/2016   LEFT AND RIGHT HEART CATHETERIZATION WITH CORONARY ANGIOGRAM N/A 06/05/2014   Procedure: LEFT AND RIGHT HEART CATHETERIZATION WITH CORONARY ANGIOGRAM;  Surgeon: HSinclair Grooms MD;  [facility];  Service: Cardiovascular;  Laterality: N/A;   NASAL FRACTURE SURGERY     SHOULDER ARTHROSCOPY WITH ROTATOR CUFF REPAIR  AND SUBACROMIAL DECOMPRESSION Right 08/16/2013   Procedure: SHOULDER ARTHROSCOPY WITH ROTATOR CUFF REPAIR AND SUBACROMIAL DECOMPRESSION;  Surgeon: JNita Sells MD;  [facility]  Service: Orthopedics;  Laterality: Right;  Right shoulder arthroscopy rotator cuff repair, subacromial decompression.   TONSILLECTOMY      Family History  Problem Relation Age of Onset   Sleep apnea Sister    Colon cancer Sister     Social History:  reports that he quit smoking about 54 years ago. His smoking use included cigarettes. He has a 15.00 pack-year smoking history. He has never used smokeless tobacco. He reports current alcohol use. He reports that he does not use drugs.  Allergies: No Known Allergies  Medications: I have reviewed the patient's current medications.  Results for orders placed or performed during the hospital encounter of 06/23/21 (from the past 48 hour(s))  SARS CORONAVIRUS 2 (TAT 6-24 HRS) Nasopharyngeal Nasopharyngeal Swab     Status: None   Collection Time: 06/23/21 10:50 PM   Specimen: Nasopharyngeal Swab  Result Value Ref Range   SARS Coronavirus 2 NEGATIVE NEGATIVE    Comment: (NOTE) SARS-CoV-2 target nucleic acids are NOT DETECTED.  The SARS-CoV-2 RNA is generally detectable in upper and lower respiratory specimens during the acute phase of  infection. Negative results do not preclude SARS-CoV-2 infection, do not rule out co-infections with other pathogens, and should not be used as the sole basis for treatment or other patient management decisions. Negative results must be combined with clinical observations, patient history, and epidemiological information. The expected result is Negative.  Fact Sheet for Patients: SugarRoll.be  Fact Sheet for Healthcare Providers: https://www.woods-mathews.com/  This test is not yet approved or cleared by the [place] FDA and  has been authorized for detection and/or  diagnosis of SARS-CoV-2 by FDA under an Emergency Use Authorization (EUA). This EUA will remain  in effect (meaning this test can be used) for the duration of the COVID-19 declaration under Se ction 564(b)(1) of the Act, 21 U.S.C. section 360bbb-3(b)(1), unless the authorization is terminated or revoked sooner.  Performed at [facility], [place] [address] [place], [place] [id number]   Basic metabolic panel     Status: Abnormal   Collection Time: 06/23/21 11:44 PM  Result Value Ref Range   Sodium 138 135 - 145 mmol/L   Potassium 4.3 3.5 - 5.1 mmol/L   Chloride 106 98 - 111 mmol/L   CO2 23 22 - 32 mmol/L   Glucose, Bld 124 (H) 70 - 99 mg/dL    Comment: Glucose reference range applies only to samples taken after fasting for at least 8 hours.   BUN 15 8 - 23 mg/dL   Creatinine, Ser 0.96 0.61 - 1.24 mg/dL   Calcium 9.4 8.9 - 10.3 mg/dL   GFR, Estimated >60 >60 mL/min    Comment: (NOTE) Calculated using the CKD-EPI Creatinine Equation (2021)    Anion gap 9 5 - 15    Comment: Performed at [facility] [address] [place], [place] [address]  CBC with Differential     Status: Abnormal   Collection Time: 06/23/21 11:44 PM  Result Value Ref Range   WBC 14.4 (H) 4.0 - 10.5 K/uL   RBC 4.47 4.22 - 5.81 MIL/uL   Hemoglobin 13.4 13.0 - 17.0 g/dL   HCT 41.7 39.0 - 52.0 %   MCV 93.3 80.0 - 100.0 fL   MCH 30.0 26.0 - 34.0 pg   MCHC 32.1 30.0 - 36.0 g/dL   RDW 13.4 11.5 - 15.5 %   Platelets 219 150 - 400 K/uL   nRBC 0.0 0.0 - 0.2 %   Neutrophils Relative % 77 %   Neutro Abs 10.9 (H) 1.7 - 7.7 K/uL   Lymphocytes Relative 15 %   Lymphs Abs 2.1 0.7 - 4.0 K/uL   Monocytes Relative 7 %   Monocytes Absolute 1.1 (H) 0.1 - 1.0 K/uL   Eosinophils Relative 1 %   Eosinophils Absolute 0.2 0.0 - 0.5 K/uL   Basophils Relative 0 %   Basophils Absolute 0.1 0.0 - 0.1 K/uL   Immature Granulocytes 0 %   Abs Immature Granulocytes 0.06 0.00 - 0.07 K/uL    Comment: Performed at [facility] [address] [place], [place] [address]  Urinalysis, Routine w reflex microscopic     Status: Abnormal   Collection Time: 06/24/21 12:27 AM  Result Value Ref Range   Color, Urine YELLOW YELLOW   APPearance CLEAR CLEAR   Specific Gravity, Urine 1.019 1.005 - 1.030   pH 5.0 5.0 - 8.0   Glucose, UA NEGATIVE NEGATIVE mg/dL   Hgb urine dipstick MODERATE (A) NEGATIVE   Bilirubin Urine NEGATIVE NEGATIVE   Ketones, ur NEGATIVE NEGATIVE mg/dL  Protein, ur NEGATIVE NEGATIVE mg/dL   Nitrite NEGATIVE NEGATIVE   Leukocytes,Ua NEGATIVE NEGATIVE   RBC / HPF >50 (H) 0 - 5 RBC/hpf   WBC, UA 0-5 0 - 5 WBC/hpf   Bacteria, UA NONE SEEN NONE SEEN   Mucus PRESENT    Hyaline Casts, UA PRESENT     Comment: Performed at [facility] [address], [place], [place] [address]  Magnesium     Status: Abnormal   Collection Time: 06/24/21  2:29 AM  Result Value Ref Range   Magnesium 1.4 (L) 1.7 - 2.4 mg/dL    Comment: Performed at [facility] [address], [place], [place] [address]  Comprehensive metabolic panel     Status: Abnormal   Collection Time: 06/24/21  2:29 AM  Result Value Ref Range   Sodium 138 135 - 145 mmol/L   Potassium 4.4 3.5 - 5.1 mmol/L   Chloride 107 98 - 111 mmol/L   CO2 23 22 - 32 mmol/L   Glucose, Bld 128 (H) 70 - 99 mg/dL    Comment: Glucose reference range applies only to samples taken after fasting for at least 8 hours.   BUN 15 8 - 23 mg/dL   Creatinine, Ser 0.98 0.61 - 1.24 mg/dL   Calcium 9.6 8.9 - 10.3 mg/dL   Total Protein 6.3 (L) 6.5 - 8.1 g/dL   Albumin 3.5 3.5 - 5.0 g/dL   AST 20 15 - 41 U/L   ALT 19 0 - 44 U/L   Alkaline Phosphatase 50 38 - 126 U/L   Total Bilirubin 1.1 0.3 - 1.2 mg/dL   GFR, Estimated >60 >60 mL/min    Comment: (NOTE) Calculated using the CKD-EPI Creatinine Equation (2021)    Anion gap 8 5 - 15    Comment: Performed at [facility] [address], [place], [place] [address]  CBC with Differential/Platelet      Status: Abnormal   Collection Time: 06/24/21  2:29 AM  Result Value Ref Range   WBC 14.0 (H) 4.0 - 10.5 K/uL   RBC 4.51 4.22 - 5.81 MIL/uL   Hemoglobin 13.7 13.0 - 17.0 g/dL   HCT 41.4 39.0 - 52.0 %   MCV 91.8 80.0 - 100.0 fL   MCH 30.4 26.0 - 34.0 pg   MCHC 33.1 30.0 - 36.0 g/dL   RDW 13.4 11.5 - 15.5 %   Platelets 219 150 - 400 K/uL   nRBC 0.0 0.0 - 0.2 %   Neutrophils Relative % 75 %   Neutro Abs 10.5 (H) 1.7 - 7.7 K/uL   Lymphocytes Relative 16 %   Lymphs Abs 2.2 0.7 - 4.0 K/uL   Monocytes Relative 7 %   Monocytes Absolute 1.0 0.1 - 1.0 K/uL   Eosinophils Relative 1 %   Eosinophils Absolute 0.2 0.0 - 0.5 K/uL   Basophils Relative 0 %   Basophils Absolute 0.1 0.0 - 0.1 K/uL   Immature Granulocytes 1 %   Abs Immature Granulocytes 0.07 0.00 - 0.07 K/uL    Comment: Performed at [facility] [address], [place], [place] [address]  Protime-INR     Status: None   Collection Time: 06/24/21  2:29 AM  Result Value Ref Range   Prothrombin Time 14.6 11.4 - 15.2 seconds   INR 1.1 0.8 - 1.2    Comment: (NOTE) INR goal varies based on device and disease states. Performed at [facility], [place]  [address], [place], [place] [address]   CBG monitoring, ED     Status: Abnormal   Collection Time: 06/24/21  5:18 AM  Result Value Ref Range   Glucose-Capillary 120 (H) 70 - 99 mg/dL    Comment: Glucose reference range applies only to samples taken after fasting for at least 8 hours.    CT PELVIS WO CONTRAST  Result Date: 06/23/2021 CLINICAL DATA:  Pelvic trauma. EXAM: CT PELVIS WITHOUT CONTRAST TECHNIQUE: Multidetector CT imaging of the pelvis was performed following the standard protocol without intravenous contrast. COMPARISON:  Pelvic radiograph dated 06/23/2021. FINDINGS: Urinary Tract: The visualized distal ureters and urinary bladder appear unremarkable. Bowel: Sigmoid diverticulosis. No bowel dilatation within the pelvis. The appendix is unremarkable.  Vascular/Lymphatic: Moderate atherosclerotic calcification. No pelvic adenopathy. Reproductive: The prostate and seminal vesicles are grossly unremarkable. No pelvic mass. Other:  None Musculoskeletal: There is a comminuted mildly displaced fracture of the proximal left superior pubic ramus extending into the anterior column and medial wall of the acetabulum. There is a nondisplaced fracture of the posterior left acetabular roofs contiguous with fracture of the left iliac bone. There is a nondisplaced fracture of the left superior pubic ramus adjacent to the symphysis previous. No femoral fractures. The bones are osteopenic. Degenerative changes of the lower lumbar spine. There is a small pelvic hematoma to the left of the bladder and along the left pelvic sidewall. There is mild mass effect on the bladder. IMPRESSION: 1. Comminuted mildly displaced fracture of the left acetabulum extending into the left iliac bone and proximal left superior pubic ramus. Additional fracture of the distal left superior pubic ramus adjacent to the symphysis previous. No femoral fracture. 2. Small pelvic hematoma to the left of the bladder and along the left pelvic sidewall. 3. Aortic Atherosclerosis (ICD10-I70.0). Electronically Signed   By: Anner Crete M.D.   On: 06/23/2021 22:58   DG Hip Unilat With Pelvis 2-3 Views Left  Result Date: 06/23/2021 CLINICAL DATA:  Left hip pain after fall EXAM: DG HIP (WITH OR WITHOUT PELVIS) 2-3V LEFT COMPARISON:  None. FINDINGS: Frontal view of the pelvis as well as frontal and frogleg lateral views of the left hip are obtained. No acute fracture, subluxation, or dislocation. Mild symmetrical bilateral hip osteoarthritis. Sacroiliac joints are normal. Soft tissues are unremarkable. IMPRESSION: 1. Osteoarthritis.  No acute fracture. Electronically Signed   By: Randa Ngo M.D.   On: 06/23/2021 19:42    Review of Systems  HENT:  Negative for ear discharge, ear pain, hearing loss and  tinnitus.   Eyes:  Negative for photophobia and pain.  Respiratory:  Negative for cough and shortness of breath.   Cardiovascular:  Negative for chest pain.  Gastrointestinal:  Negative for abdominal pain, nausea and vomiting.  Genitourinary:  Negative for dysuria, flank pain, frequency and urgency.  Musculoskeletal:  Positive for arthralgias (Left hip). Negative for back pain, myalgias and neck pain.  Neurological:  Negative for dizziness and headaches.  Hematological:  Does not bruise/bleed easily.  Psychiatric/Behavioral:  The patient is not nervous/anxious.   Blood pressure 138/72, pulse 65, temperature 98 F (36.7 C), temperature source Axillary, resp. rate 17, SpO2 91 %. Physical Exam Constitutional:      General: He is not in acute distress.    Appearance: He is well-developed. He is not diaphoretic.  HENT:     Head: Normocephalic and atraumatic.  Eyes:     General: No scleral icterus.       Right eye: No discharge.  Left eye: No discharge.     Conjunctiva/sclera: Conjunctivae normal.  Cardiovascular:     Rate and Rhythm: Normal rate and regular rhythm.  Pulmonary:     Effort: Pulmonary effort is normal. No respiratory distress.  Musculoskeletal:     Cervical back: Normal range of motion.     Comments: Pelvis--no traumatic wounds or rash, no ecchymosis, stable to manual stress, nontender  LLE No traumatic wounds, ecchymosis, or rash  Nontender  No knee or ankle effusion  Knee stable to varus/ valgus and anterior/posterior stress  Sens DPN, SPN, TN intact  Motor EHL, ext, flex, evers 5/5  DP 2+, PT 0, No significant edema  Skin:    General: Skin is warm and dry.  Neurological:     Mental Status: He is alert.  Psychiatric:        Mood and Affect: Mood normal.        Behavior: Behavior normal.    Assessment/Plan: Left acetabulum fx -- Plan non-operative management with [facility]. F/u with Dr. Doreatha Martin in 3 weeks.    Lisette Abu, PA-C Orthopedic  Surgery [phone] 06/24/2021, 11:32 AM

## 2021-06-24 NOTE — H&P (Addendum)
History and Physical    PLEASE NOTE THAT DRAGON DICTATION SOFTWARE WAS USED IN THE CONSTRUCTION OF THIS NOTE.   Jordan [name] [id number] DOB: Mar 12, 1941 DOA: 06/23/2021  PCP: [name], MD Patient coming from: home   I have personally briefly reviewed patient's old medical records in [place]  Chief Complaint: Left hip pain  HPI: Jordan [name] is a 80 y.o. male with medical history significant for chronic diastolic heart failure, Parkinson's disease, symptomatic bradycardia status post pacemaker placement, type 2 diabetes mellitus, obstructive sleep apnea on nocturnal CPAP, who is admitted to [facility] on 06/24/2021 with acute left acetabular fracture after presenting from home to [facility] ED complaining of left hip pain.   Patient reports tripping while attempting to ambulate at home, resulting in a ground level fall during which left hip was the principal point of contact with the floor below. As a result of this fall, the patient reports immediate development of sharp left hip pain, with radiation into the left groin. States that this pain has been constant since onset, with exacerbation when attempting to move the left lower extremity.  As a consequence of the associated intensity of this discomfort, reports that he is unable to bear weight on the left lower extremity at this time. otherwise, denies any acute arthralgias or myalgias as a result of the above fall.  Denies any acute numbness or paresthesias in bilateral lower extremities, and confirms that left hip representations a native joint.  Did not hit head as a component of this fall, and denies any associated loss of consciousness.  Denies any preceding or associated chest pain, shortness of breath, diaphoresis, palpitations, nausea, vomiting, dizziness, presyncope, or syncope.  Denies any subsequent headache, neck pain, blurry vision, or diplopia.    On Plavix 75 milligrams p.o. daily as an outpatient, with  most recent dose occurring on the morning of 06/23/2021.  Otherwise, no additional blood thinners as an outpatient, including no aspirin.  Per chart review, patient has a documented history of coronary artery disease, with most recent documented coronary angiography appearing to have occurred in 2015.  Additionally, he has a history of chronic diastolic heart failure, with most recent echocardiogram in June 2021 showing LVEF 65 to 70%, no evidence of acute focal wall motion abnormalities, mild concentric LVH, grade 2 diastolic dysfunction, mildly dilated left atrium, no evidence of significant valvular pathology.  He is not on any scheduled diuretic medications at home.  Denies any recent shortness of breath, orthopnea, PND, or worsening of peripheral edema.  He also has a history of symptomatic bradycardia status post pacemaker placement.  The patient denies any recent subjective fever, chills, rigors, or generalized myalgias.  Denies any recent abdominal pain, diarrhea, or rash.  No recent known COVID-19 exposures.  He also denies any acute dysuria, or change in urinary urgency/frequency.    ED Course:  Vital signs in the ED were notable for the following: Temperature max 98.8, heart rate 65-78, blood pressure 135/78 -145/89; respiratory rate 18-20, oxygen saturation 94 to 99% on room air.  Labs were notable for the following: BMP was notable for the following: Sodium 138, potassium 4.3, bicarbonate 23, creatinine 0.96, glucose 124.  CBC notable for white blood cell count 14,400, hemoglobin 13.4, and platelet count 219.  Screening nasopharyngeal COVID-19 PCR was checked in the ED today, with result currently pending.  CT pelvis without contrast showed comminuted mildly displaced fracture of the left acetabulum extending into the left iliac bone  and proximal left superior pubic ramus.   EDP discussed the patient's case and imaging with the on-call orthopedic surgeon, Dr. Erlinda Hong, who recommends admission to  the hospitalist service for further evaluation and management of acute left acetabular fracture, and conveys that orthopedic surgery will formally consult and evaluate the patient in the morning.    Review of Systems: As per HPI otherwise 10 point review of systems negative.   Past Medical History:  Diagnosis Date   Arthritis    R shoulder, bone spur   Arthritis    "hands" (03/25/2016)   Chronic lower back pain    Coronary heart disease    Dr Pernell Dupre   H/O cardiovascular stress test    perhaps last one was 2009   Heart murmur    12/29/11 echo: mild MR, no AS, trivial TR   History of gout    Hyperlipidemia    Hypertension    saw Dr. Linard Millers last earl;y- 2014, cardiac cath. last done ?2009, blocks seen didn't require any intervention at that point.   Narcolepsy    [name] 04-11-97; Mean Latency 1.77mn, SOREM 2   OSA on CPAP    NPSG 12-13-98 AHI 22.7   PONV (postoperative nausea and vomiting)    Presence of permanent cardiac pacemaker    Shortness of breath    with exertion    [place]    Type II diabetes mellitus ([place]     Past Surgical History:  Procedure Laterality Date   CARDIAC CATHETERIZATION  2009?   EP IMPLANTABLE DEVICE N/A 03/25/2016   Procedure: Pacemaker Implant - Dual Chamber;  Surgeon: GEvans Lance MD;  [facility];  Service: Cardiovascular;  Laterality: N/A;   FRACTURE SURGERY     INSERT / REPLACE / REMOVE PACEMAKER  03/24/2016   LEFT AND RIGHT HEART CATHETERIZATION WITH CORONARY ANGIOGRAM N/A 06/05/2014   Procedure: LEFT AND RIGHT HEART CATHETERIZATION WITH CORONARY ANGIOGRAM;  Surgeon: HSinclair Grooms MD;  [facility];  Service: Cardiovascular;  Laterality: N/A;   NASAL FRACTURE SURGERY     SHOULDER ARTHROSCOPY WITH ROTATOR CUFF REPAIR AND SUBACROMIAL DECOMPRESSION Right 08/16/2013   Procedure: SHOULDER ARTHROSCOPY WITH ROTATOR CUFF REPAIR AND SUBACROMIAL DECOMPRESSION;  Surgeon: JNita Sells MD;  [place] [facility]   Service: Orthopedics;  Laterality: Right;  Right shoulder arthroscopy rotator cuff repair, subacromial decompression.   TONSILLECTOMY      Social History:  reports that he quit smoking about 54 years ago. His smoking use included cigarettes. He has a 15.00 pack-year smoking history. He has never used smokeless tobacco. He reports current alcohol use. He reports that he does not use drugs.   No Known Allergies  Family History  Problem Relation Age of Onset   Sleep apnea Sister    Colon cancer Sister      Prior to Admission medications   Medication Sig Start Date End Date Taking? Authorizing Provider  acetaminophen (TYLENOL) 500 MG tablet Take 1,000 mg by mouth at bedtime.   Yes [provider]  amphetamine-dextroamphetamine (ADDERALL) 10 MG tablet Take 10 mg by mouth daily with breakfast.   Yes [provider]  atorvastatin (LIPITOR) 40 MG tablet Take 20 mg by mouth daily. 05/03/20  Yes [provider]  carbidopa-levodopa (SINEMET) 25-100 MG tablet Take 1 tablet by mouth in the morning and at bedtime. Patient taking differently: Take 1 tablet by mouth 3 (three) times daily. 05/12/21  Yes SGarvin Fila MD  clopidogrel (PLAVIX) 75 MG tablet Take 1 tablet (75 mg total) by mouth daily. 04/29/20  Yes Love, Ivan Anchors, PA-C  Coenzyme Q10 (CO Q-10 PO) Take 1 capsule by mouth daily.   Yes [provider]  ibuprofen (ADVIL) 200 MG tablet Take 400 mg by mouth every 6 (six) hours as needed for moderate pain.   Yes [provider]  lisinopril (ZESTRIL) 5 MG tablet Take 5 mg by mouth daily. 11/14/20  Yes [provider]  metFORMIN (GLUCOPHAGE-XR) 500 MG 24 hr tablet Take 4 tablets (2,000 mg total) by mouth daily with breakfast. 04/29/20  Yes Love, Ivan Anchors, PA-C  Multiple Vitamin (MULTIVITAMIN WITH MINERALS) TABS tablet Take 1 tablet by mouth daily. 02/19/18  Yes Georgette Shell, MD  NITROSTAT 0.4 MG SL tablet PLACE 1 TABLET (0.4 MG TOTAL) UNDER  THE TONGUE EVERY 5 (FIVE) MINUTES AS NEEDED FOR CHEST PAIN. Patient taking differently: Place 0.4 mg under the tongue every 5 (five) minutes as needed for chest pain. 03/31/16  Yes Belva Crome, MD  Omega-3 Fatty Acids (OMEGA-3 PO) Take 1 capsule by mouth 3 (three) times daily.    Yes [provider]  PRESCRIPTION MEDICATION See admin instructions. CPAP- At bedtime   Yes [provider]  sertraline (ZOLOFT) 50 MG tablet Take 50 mg by mouth daily.   Yes [provider]  betamethasone dipropionate (DIPROLENE) 0.05 % ointment 1 application Patient not taking: No sig reported 11/14/20   [provider]  fluticasone (FLONASE) 50 MCG/ACT nasal spray Place 1 spray into both nostrils daily. Patient not taking: No sig reported 06/24/20   Lauraine Rinne, NP     Objective    Physical Exam: Vitals:   06/23/21 2143 06/23/21 2230 06/23/21 2330 06/24/21 0000  BP: 135/78 140/84 (!) 147/68 (!) 150/78  Pulse: 68 78 66 73  Resp: 20 (!) 21 (!) 21 20  Temp:      SpO2: 92% 93% 93% 92%    General: appears to be stated age; alert, oriented Skin: warm, dry, no rash Head:  AT/[place]:  Oral mucosa membranes appear moist, normal dentition Neck: supple; trachea midline Heart:  RRR; did not appreciate any M/R/G Lungs: CTAB, did not appreciate any wheezes, rales, or rhonchi Abdomen: + BS; soft, ND, NT Vascular: 2+ pedal pulses b/l; 2+ radial pulses b/l Extremities: no peripheral edema, no muscle wasting Neuro: In the setting of acute left acetabular fracture, refrain from strength assessment involving the left lower extremity; otherwise, all strength intact in bilateral upper extremities as well as right lower extremity.  Sensation intact in upper and lower extremities bilaterally.    Labs on Admission: I have personally reviewed following labs and imaging studies  CBC: Recent Labs  Lab 06/23/21 2344  WBC 14.4*  NEUTROABS 10.9*  HGB 13.4  HCT 41.7  MCV 93.3  PLT  [id number]   Basic Metabolic Panel: No results for input(s): NA, K, CL, CO2, GLUCOSE, BUN, CREATININE, CALCIUM, MG, PHOS in the last 168 hours. GFR: CrCl cannot be calculated (Patient's most recent lab result is older than the maximum 21 days allowed.). Liver Function Tests: No results for input(s): AST, ALT, ALKPHOS, BILITOT, PROT, ALBUMIN in the last 168 hours. No results for input(s): LIPASE, AMYLASE in the last 168 hours. No results for input(s): AMMONIA in the last 168 hours. Coagulation Profile: No results for input(s): INR, PROTIME in the last 168 hours. Cardiac Enzymes: No results for input(s): CKTOTAL, CKMB, CKMBINDEX, TROPONINI in the last  168 hours. BNP (last 3 results) No results for input(s): PROBNP in the last 8760 hours. HbA1C: No results for input(s): HGBA1C in the last 72 hours. CBG: No results for input(s): GLUCAP in the last 168 hours. Lipid Profile: No results for input(s): CHOL, HDL, LDLCALC, TRIG, CHOLHDL, LDLDIRECT in the last 72 hours. Thyroid Function Tests: No results for input(s): TSH, T4TOTAL, FREET4, T3FREE, THYROIDAB in the last 72 hours. Anemia Panel: No results for input(s): VITAMINB12, FOLATE, FERRITIN, TIBC, IRON, RETICCTPCT in the last 72 hours. Urine analysis:    Component Value Date/Time   COLORURINE YELLOW 04/14/2020 1700   APPEARANCEUR CLEAR 04/14/2020 1700   LABSPEC 1.014 04/14/2020 1700   PHURINE 5.0 04/14/2020 1700   GLUCOSEU NEGATIVE 04/14/2020 1700   HGBUR NEGATIVE 04/14/2020 1700   BILIRUBINUR NEGATIVE 04/14/2020 1700   KETONESUR NEGATIVE 04/14/2020 1700   PROTEINUR NEGATIVE 04/14/2020 1700   NITRITE NEGATIVE 04/14/2020 1700   LEUKOCYTESUR NEGATIVE 04/14/2020 1700    Radiological Exams on Admission: CT PELVIS WO CONTRAST  Result Date: 06/23/2021 CLINICAL DATA:  Pelvic trauma. EXAM: CT PELVIS WITHOUT CONTRAST TECHNIQUE: Multidetector CT imaging of the pelvis was performed following the standard protocol without intravenous contrast.  COMPARISON:  Pelvic radiograph dated 06/23/2021. FINDINGS: Urinary Tract: The visualized distal ureters and urinary bladder appear unremarkable. Bowel: Sigmoid diverticulosis. No bowel dilatation within the pelvis. The appendix is unremarkable. Vascular/Lymphatic: Moderate atherosclerotic calcification. No pelvic adenopathy. Reproductive: The prostate and seminal vesicles are grossly unremarkable. No pelvic mass. Other:  None Musculoskeletal: There is a comminuted mildly displaced fracture of the proximal left superior pubic ramus extending into the anterior column and medial wall of the acetabulum. There is a nondisplaced fracture of the posterior left acetabular roofs contiguous with fracture of the left iliac bone. There is a nondisplaced fracture of the left superior pubic ramus adjacent to the symphysis previous. No femoral fractures. The bones are osteopenic. Degenerative changes of the lower lumbar spine. There is a small pelvic hematoma to the left of the bladder and along the left pelvic sidewall. There is mild mass effect on the bladder. IMPRESSION: 1. Comminuted mildly displaced fracture of the left acetabulum extending into the left iliac bone and proximal left superior pubic ramus. Additional fracture of the distal left superior pubic ramus adjacent to the symphysis previous. No femoral fracture. 2. Small pelvic hematoma to the left of the bladder and along the left pelvic sidewall. 3. Aortic Atherosclerosis (ICD10-I70.0). Electronically Signed   By: Anner Crete M.D.   On: 06/23/2021 22:58   DG Hip Unilat With Pelvis 2-3 Views Left  Result Date: 06/23/2021 CLINICAL DATA:  Left hip pain after fall EXAM: DG HIP (WITH OR WITHOUT PELVIS) 2-3V LEFT COMPARISON:  None. FINDINGS: Frontal view of the pelvis as well as frontal and frogleg lateral views of the left hip are obtained. No acute fracture, subluxation, or dislocation. Mild symmetrical bilateral hip osteoarthritis. Sacroiliac joints are normal.  Soft tissues are unremarkable. IMPRESSION: 1. Osteoarthritis.  No acute fracture. Electronically Signed   By: Randa Ngo M.D.   On: 06/23/2021 19:42      Assessment/Plan   Jordan [name] is a 80 y.o. male with medical history significant for chronic diastolic heart failure, Parkinson's disease, symptomatic bradycardia status post pacemaker placement, type 2 diabetes mellitus, obstructive sleep apnea on nocturnal CPAP, who is admitted to [facility] on 06/23/2021 with acute left acetabular fracture after presenting from home to [facility] ED complaining of left hip pain.    Principal  Problem:   Acetabular fracture (HCC) Active Problems:   HLD (hyperlipidemia)   OSA on CPAP   Essential hypertension   DM2 (diabetes mellitus, type 2) (HCC)   Acute hip pain, left   Fall at home, initial encounter   Leukocytosis   Chronic diastolic heart failure ([place])      #) Acute left acetabular fracture: confirmed via presenting CT pelvis and stemming from ground level mechanical fall without associated loss of consciousness that occurred earlier on the day of admission, as further described above, resulting in immediate develop of acute left hip pain.  The patient's case/imaging were discussed with the on-call orthopedic surgeon, Dr. Erlinda Hong,  who recommended admission to the hospitalist service for further evaluation and management of acute left acetabular fracture, and conveyed plan for orthopedic surgery to formally consult and evaluate the patient in the morning (06/24/21). At this time, the left lower extremity is neurovascularly intact, and the patient reports adequate pain control.  He is on daily Plavix at home, with most recent dose occurring on the morning of 06/23/2021.  Otherwise, not on any blood thinners at home.  While the patient does have a documented history of chronic diastolic heart failure, no clinical evidence to suggest acutely decompensated heart failure or acute MI at this time.  Will  check EKG for preoperative purposes.     Plan: Formal orthopedic surgery consult for definitive surgical management, with orthopedic surgery conveying they plan to evaluate the patient in the morning. Patient has been made n.p.o. in preparation for potential surgical intervention.  No pharmacologic anticoagulation leading up to potential surgery.  SCD's.  As needed IV fentanyl.  Anticipate postoperative PT consult.  Check INR.  Pre-op EKG has been ordered, as above. Check 25-hydroxy vit D level.  Holding home Plavix.     #) Ground level mechanical fall: The patient reports a ground level mechanical fall earlier today in which he tripped without any associated loss of consciousness. Appears to be purely mechanical in nature, without clinical evidence at this time to suggest contributory dizziness, presyncope, syncope, or acute ischemic CVA. Does not appear to have hit head as component of this fall. presenting While this fall appears to be purely mechanical in nature, will also check urinalysis to evaluate for any underlying infectious contribution.   Plan: Check urinalysis, as above.  Repeat BMP and CBC with differential in the morning. Fall precautions. Anticipate postoperative PT consult.       #) Leukocytosis: Presenting CBC reflects mildly elevated white cell count of 14,400. Suspect that this is reactive in nature in the setting of presenting ground-level mechanical fall as well as associated acute left acetabular fracture.  No evidence to suggest underlying infectious process at this time, including no acute urinary symptoms or any acute respiratory symptoms.  Of note, result of screening COVID-19 PCR checked in the ED today is currently pending. Will check urinalysis to further evaluate for any underlying infectious contribution.  Plan: Check urinalysis, as above.  Repeat CBC with differential in the morning.  Follow for result screening COVID-19 PCR.     #) Obstructive sleep apnea:  The patient reports good compliance with nightly CPAP use as an outpatient.  Plan: I have placed a consult with respiratory therapy for provision of CPAP to be used on a nightly basis during this hospitalization.      #) Essential hypertension: On lisinopril as an outpatient.  Systolic blood pressures in the ED have been in the 130s 140s.  Plan: We  will hold home lisinopril for now in the setting of current n.p.o. status.  Close monitoring of ensuing blood pressure via routine vital signs.       #) Hyperlipidemia: On high intensity atorvastatin as an outpatient.  Plan: We will hold home atorvastatin for now the setting of current n.p.o. status.      #) Type 2 diabetes mellitus: Documented history of such. Insulin-dependent as an outpatient, rather on metformin as a sole oral hypoglycemic agent at home.  Presenting blood sugar noted to be 124.  Plan: Hold home metformin during this hospitalization.  In the setting of current n.p.o. status, every 6 hours Accu-Cheks with low-dose sliding scale insulin.      #) Chronic diastolic heart failure: Documented history of such, with most recent echocardiogram in June 2021 notable for grade 2 diastolic dysfunction, with additional results as further described above.  Not on any scheduled diuretic medications at home.  No clinical evidence to suggest acutely decompensated heart failure at this time.  Plan: Monitor strict I's and O's and daily weights.  Add on serum magnesium level.  Repeat BMP in the morning.     DVT prophylaxis: SCDs Code Status: Full code Family Communication: none Disposition Plan: Per Rounding Team Consults called: on-call orthopedic surgeon, Dr. Erlinda Hong, consulted, as further described above;  Admission status: Inpatient; MedSurg     Of note, this patient was added by me to the following Admit List/Treatment Team: [email or web address].      PLEASE NOTE THAT DRAGON DICTATION SOFTWARE WAS USED IN THE CONSTRUCTION OF THIS  NOTE.   [place] [facility] Pager [phone] From [place]  Otherwise, please contact night-coverage  [email or web address] Password [facility]   06/24/2021, 12:26 AM

## 2021-06-24 NOTE — Progress Notes (Signed)
Brief note regarding preliminary plan, with full H&P to follow:  80 year old male who is admitted with acute left acetabular fracture following ground-level mechanical fall on 06/23/2021, without loss of consciousness.  On Plavix as an outpatient.  Ensuing imaging revealed acute left acetabular fracture.  Case was discussed with on-call orthopedic surgeon, Dr. Erlinda Hong, who recommends admission to the hospitalist service, with plan for ortho to evaluate in person in the morning.  As needed IV fentanyl.  Check EKG. Will keep NPO. SCD's.  Holding home Plavix.    Babs Bertin, DO Hospitalist

## 2021-06-24 NOTE — ED Notes (Signed)
Pt has 2+ left pedal pulse, cap refill less than 3 sec, warm to touch, pt able to wiggle toes, sensation intact.

## 2021-06-24 NOTE — ED Notes (Signed)
Attempted to call report at [name] stated she couldn't take report would have to wait till after shift change.

## 2021-06-24 NOTE — Progress Notes (Addendum)
PROGRESS NOTE        PATIENT DETAILS Name: Jordan [name] Age: 80 y.o. Sex: male Date of Birth: 05/03/1941 Admit Date: 06/23/2021 Admitting Physician Rhetta Mura, DO [id number], Gwyndolyn Saxon, MD  Brief Narrative: Patient is a 80 y.o. male with history of HF PEF, Parkinson's disease, bradycardia-s/p PPM placement, DM-2, OSA, narcolepsy-who presented following a mechanical fall-found to have left acetabular fracture.  Significant events: 8/23>> presented to [facility] ED following a fall-found to have left acetabular fracture.  Significant studies: 8/25>> CT pelvis: : Comminuted mildly displaced fracture of left acetabulum, extending to left iliac bone and left superior pubic rami.  Left superior pubic rami fracture as well.  Small pelvic hematoma.  Antimicrobial therapy: None  Microbiology data: 8/23>> COVID PCR: Negative  Procedures : None  Consults: Orthopedics  DVT Prophylaxis : SCDs Start: 06/24/21 0019   Subjective: Lying comfortably in bed-stable left hip pain.   Assessment/Plan: Bilateral pelvic fractures-communicated left acetabular fracture: Following a mechanical fall-await orthopedics eval.    HTN: BP stable-lisinopril remains on hold-resume when able.  HFpEF: Euvolemic-no indication for diuretics  DM-2: CBG stable-continue SSI-metformin on hold.  Recent Labs    06/24/21 0518  GLUCAP 120*    Hypomagnesemia: Replete and recheck.  HLD: On statin  History of CVA: No focal deficits-Plavix on hold for potential surgery.  History of Parkinson's disease/parkinsonism: Followed by outpatient neurology-resume Sinemet.  History of narcolepsy/OSA: Continue Advil and CPAP nightly.  Obesity: Estimated body mass index is 33.45 kg/m as calculated from the following:   Height as of 06/17/21: '5\' 8"'$  (1.727 m).   Weight as of 06/17/21: 99.8 kg.    Diet: Diet Order             Diet NPO time specified  Diet effective now                     Code Status: Full code  Family Communication: None at bedside-we will update family/spouse later once orthopedic input is available.  Disposition Plan: Status is: Inpatient  Remains inpatient appropriate because:Inpatient level of care appropriate due to severity of illness  Dispo: The patient is from: Home              Anticipated d/c is to: SNF              Patient currently is not medically stable to d/c.   Difficult to place patient No   Barriers to Discharge: Multiple pelvic fractures-/left occipital fracture-currently n.p.o.-awaiting orthopedic recommendations.  Antimicrobial agents: Anti-infectives (From admission, onward)    None        Time spent: 35 minutes-Greater than 50% of this time was spent in counseling, explanation of diagnosis, planning of further management, and coordination of care.  MEDICATIONS: Scheduled Meds:  [START ON 06/25/2021] amphetamine-dextroamphetamine  10 mg Oral Q breakfast   atorvastatin  20 mg Oral Daily   carbidopa-levodopa  1 tablet Oral TID   insulin aspart  0-6 Units Subcutaneous Q6H   sertraline  50 mg Oral Daily   Continuous Infusions: PRN Meds:.acetaminophen **OR** acetaminophen, fentaNYL (SUBLIMAZE) injection, naLOXone (NARCAN)  injection, ondansetron (ZOFRAN) IV   PHYSICAL EXAM: Vital signs: Vitals:   06/24/21 0700 06/24/21 0750 06/24/21 0800 06/24/21 0900  BP: 126/73  (!) 156/84 110/65  Pulse: 65  71 70  Resp: 18  (!) 22 16  Temp:  98 F (36.7 C)    TempSrc:  Axillary    SpO2: 92%  92% 91%   There were no vitals filed for this visit. There is no height or weight on file to calculate BMI.   Gen Exam:Alert awake-not in any distress HEENT:atraumatic, normocephalic Chest: B/L clear to auscultation anteriorly CVS:S1S2 regular Abdomen:soft non tender, non distended Extremities:no edema Neurology: Non focal Skin: no rash  I have personally reviewed following labs and imaging studies  LABORATORY  DATA: CBC: Recent Labs  Lab 06/23/21 2344 06/24/21 0229  WBC 14.4* 14.0*  NEUTROABS 10.9* 10.5*  HGB 13.4 13.7  HCT 41.7 41.4  MCV 93.3 91.8  PLT 219 [id number]    Basic Metabolic Panel: Recent Labs  Lab 06/23/21 2344 06/24/21 0229  NA 138 138  K 4.3 4.4  CL 106 107  CO2 23 23  GLUCOSE 124* 128*  BUN 15 15  CREATININE 0.96 0.98  CALCIUM 9.4 9.6  MG  --  1.4*    GFR: Estimated Creatinine Clearance: 67.7 mL/min (by C-G formula based on SCr of 0.98 mg/dL).  Liver Function Tests: Recent Labs  Lab 06/24/21 0229  AST 20  ALT 19  ALKPHOS 50  BILITOT 1.1  PROT 6.3*  ALBUMIN 3.5   No results for input(s): LIPASE, AMYLASE in the last 168 hours. No results for input(s): AMMONIA in the last 168 hours.  Coagulation Profile: Recent Labs  Lab 06/24/21 0229  INR 1.1    Cardiac Enzymes: No results for input(s): CKTOTAL, CKMB, CKMBINDEX, TROPONINI in the last 168 hours.  BNP (last 3 results) No results for input(s): PROBNP in the last 8760 hours.  Lipid Profile: No results for input(s): CHOL, HDL, LDLCALC, TRIG, CHOLHDL, LDLDIRECT in the last 72 hours.  Thyroid Function Tests: No results for input(s): TSH, T4TOTAL, FREET4, T3FREE, THYROIDAB in the last 72 hours.  Anemia Panel: No results for input(s): VITAMINB12, FOLATE, FERRITIN, TIBC, IRON, RETICCTPCT in the last 72 hours.  Urine analysis:    Component Value Date/Time   COLORURINE YELLOW 06/24/2021 0027   APPEARANCEUR CLEAR 06/24/2021 0027   LABSPEC 1.019 06/24/2021 0027   PHURINE 5.0 06/24/2021 0027   GLUCOSEU NEGATIVE 06/24/2021 0027   HGBUR MODERATE (A) 06/24/2021 0027   BILIRUBINUR NEGATIVE 06/24/2021 0027   KETONESUR NEGATIVE 06/24/2021 0027   PROTEINUR NEGATIVE 06/24/2021 0027   NITRITE NEGATIVE 06/24/2021 0027   LEUKOCYTESUR NEGATIVE 06/24/2021 0027    Sepsis Labs: Lactic Acid, Venous No results found for: LATICACIDVEN  MICROBIOLOGY: Recent Results (from the past 240 hour(s))  SARS  CORONAVIRUS 2 (TAT 6-24 HRS) Nasopharyngeal Nasopharyngeal Swab     Status: None   Collection Time: 06/23/21 10:50 PM   Specimen: Nasopharyngeal Swab  Result Value Ref Range Status   SARS Coronavirus 2 NEGATIVE NEGATIVE Final    Comment: (NOTE) SARS-CoV-2 target nucleic acids are NOT DETECTED.  The SARS-CoV-2 RNA is generally detectable in upper and lower respiratory specimens during the acute phase of infection. Negative results do not preclude SARS-CoV-2 infection, do not rule out co-infections with other pathogens, and should not be used as the sole basis for treatment or other patient management decisions. Negative results must be combined with clinical observations, patient history, and epidemiological information. The expected result is Negative.  Fact Sheet for Patients: SugarRoll.be  Fact Sheet for Healthcare Providers: https://www.woods-mathews.com/  This test is not yet approved or cleared by the [place] FDA and  has been authorized for detection and/or diagnosis of SARS-CoV-2 by FDA  under an Emergency Use Authorization (EUA). This EUA will remain  in effect (meaning this test can be used) for the duration of the COVID-19 declaration under Se ction 564(b)(1) of the Act, 21 U.S.C. section 360bbb-3(b)(1), unless the authorization is terminated or revoked sooner.  Performed at [facility], [place] [address] [place], [place] [address]     RADIOLOGY STUDIES/RESULTS: CT PELVIS WO CONTRAST  Result Date: 06/23/2021 CLINICAL DATA:  Pelvic trauma. EXAM: CT PELVIS WITHOUT CONTRAST TECHNIQUE: Multidetector CT imaging of the pelvis was performed following the standard protocol without intravenous contrast. COMPARISON:  Pelvic radiograph dated 06/23/2021. FINDINGS: Urinary Tract: The visualized distal ureters and urinary bladder appear unremarkable. Bowel: Sigmoid diverticulosis. No bowel dilatation within the pelvis. The appendix  is unremarkable. Vascular/Lymphatic: Moderate atherosclerotic calcification. No pelvic adenopathy. Reproductive: The prostate and seminal vesicles are grossly unremarkable. No pelvic mass. Other:  None Musculoskeletal: There is a comminuted mildly displaced fracture of the proximal left superior pubic ramus extending into the anterior column and medial wall of the acetabulum. There is a nondisplaced fracture of the posterior left acetabular roofs contiguous with fracture of the left iliac bone. There is a nondisplaced fracture of the left superior pubic ramus adjacent to the symphysis previous. No femoral fractures. The bones are osteopenic. Degenerative changes of the lower lumbar spine. There is a small pelvic hematoma to the left of the bladder and along the left pelvic sidewall. There is mild mass effect on the bladder. IMPRESSION: 1. Comminuted mildly displaced fracture of the left acetabulum extending into the left iliac bone and proximal left superior pubic ramus. Additional fracture of the distal left superior pubic ramus adjacent to the symphysis previous. No femoral fracture. 2. Small pelvic hematoma to the left of the bladder and along the left pelvic sidewall. 3. Aortic Atherosclerosis (ICD10-I70.0). Electronically Signed   By: Anner Crete M.D.   On: 06/23/2021 22:58   DG Hip Unilat With Pelvis 2-3 Views Left  Result Date: 06/23/2021 CLINICAL DATA:  Left hip pain after fall EXAM: DG HIP (WITH OR WITHOUT PELVIS) 2-3V LEFT COMPARISON:  None. FINDINGS: Frontal view of the pelvis as well as frontal and frogleg lateral views of the left hip are obtained. No acute fracture, subluxation, or dislocation. Mild symmetrical bilateral hip osteoarthritis. Sacroiliac joints are normal. Soft tissues are unremarkable. IMPRESSION: 1. Osteoarthritis.  No acute fracture. Electronically Signed   By: Randa Ngo M.D.   On: 06/23/2021 19:42     LOS: 0 days   Oren Binet, MD  [facility]    To  contact the attending provider between 7A-7P or the covering provider during after hours 7P-7A, please log into the web site [email or web address] and access using universal [place] password for that web site. If you do not have the password, please call the hospital operator.  06/24/2021, 11:13 AM

## 2021-06-24 NOTE — Plan of Care (Signed)
  Problem: Education: Goal: Knowledge of General Education information will improve Description: Including pain rating scale, medication(s)/side effects and non-pharmacologic comfort measures Outcome: Progressing   Problem: Clinical Measurements: Goal: Ability to maintain clinical measurements within normal limits will improve Outcome: Progressing   Problem: Activity: Goal: Risk for activity intolerance will decrease Outcome: Progressing   Problem: Coping: Goal: Level of anxiety will decrease Outcome: Progressing   Problem: Elimination: Goal: Will not experience complications related to bowel motility Outcome: Progressing   Problem: Safety: Goal: Ability to remain free from injury will improve Outcome: Progressing

## 2021-06-25 DIAGNOSIS — S32402A Unspecified fracture of left acetabulum, initial encounter for closed fracture: Secondary | ICD-10-CM | POA: Diagnosis not present

## 2021-06-25 DIAGNOSIS — Z9989 Dependence on other enabling machines and devices: Secondary | ICD-10-CM | POA: Diagnosis not present

## 2021-06-25 DIAGNOSIS — E1159 Type 2 diabetes mellitus with other circulatory complications: Secondary | ICD-10-CM | POA: Diagnosis not present

## 2021-06-25 DIAGNOSIS — I5032 Chronic diastolic (congestive) heart failure: Secondary | ICD-10-CM | POA: Diagnosis not present

## 2021-06-25 DIAGNOSIS — G4733 Obstructive sleep apnea (adult) (pediatric): Secondary | ICD-10-CM | POA: Diagnosis not present

## 2021-06-25 LAB — GLUCOSE, CAPILLARY
Glucose-Capillary: 106 mg/dL — ABNORMAL HIGH (ref 70–99)
Glucose-Capillary: 111 mg/dL — ABNORMAL HIGH (ref 70–99)
Glucose-Capillary: 125 mg/dL — ABNORMAL HIGH (ref 70–99)
Glucose-Capillary: 133 mg/dL — ABNORMAL HIGH (ref 70–99)
Glucose-Capillary: 148 mg/dL — ABNORMAL HIGH (ref 70–99)
Glucose-Capillary: 154 mg/dL — ABNORMAL HIGH (ref 70–99)

## 2021-06-25 LAB — VITAMIN D 25 HYDROXY (VIT D DEFICIENCY, FRACTURES): Vit D, 25-Hydroxy: 21.9 ng/mL — ABNORMAL LOW (ref 30–100)

## 2021-06-25 LAB — BASIC METABOLIC PANEL
Anion gap: 8 (ref 5–15)
BUN: 19 mg/dL (ref 8–23)
CO2: 27 mmol/L (ref 22–32)
Calcium: 9.5 mg/dL (ref 8.9–10.3)
Chloride: 103 mmol/L (ref 98–111)
Creatinine, Ser: 1 mg/dL (ref 0.61–1.24)
GFR, Estimated: >60 mL/min (ref >60)
Glucose, Bld: 127 mg/dL — ABNORMAL HIGH (ref 70–99)
Potassium: 4.4 mmol/L (ref 3.5–5.1)
Sodium: 138 mmol/L (ref 135–145)

## 2021-06-25 LAB — CBC
HCT: 38.6 % — ABNORMAL LOW (ref 39.0–52.0)
Hemoglobin: 12.7 g/dL — ABNORMAL LOW (ref 13.0–17.0)
MCH: 29.5 pg (ref 26.0–34.0)
MCHC: 32.9 g/dL (ref 30.0–36.0)
MCV: 89.8 fL (ref 80.0–100.0)
Platelets: 212 10*3/uL (ref 150–400)
RBC: 4.3 MIL/uL (ref 4.22–5.81)
RDW: 13.2 % (ref 11.5–15.5)
WBC: 11.5 10*3/uL — ABNORMAL HIGH (ref 4.0–10.5)
nRBC: 0 % (ref 0.0–0.2)

## 2021-06-25 LAB — MAGNESIUM: Magnesium: 2 mg/dL (ref 1.7–2.4)

## 2021-06-25 MED ORDER — ACETAMINOPHEN 500 MG PO TABS
1000.0000 mg | ORAL_TABLET | Freq: Three times a day (TID) | ORAL | Status: DC
  Administered 2021-06-25 – 2021-07-01 (×19): 1000 mg via ORAL
  Filled 2021-06-25 (×19): qty 2

## 2021-06-25 MED ORDER — OXYCODONE HCL 5 MG PO TABS: 5.0000 mg | ORAL_TABLET | Freq: Four times a day (QID) | ORAL | Status: DC | PRN

## 2021-06-25 MED ORDER — ENOXAPARIN SODIUM 60 MG/0.6ML IJ SOSY
50.0000 mg | PREFILLED_SYRINGE | INTRAMUSCULAR | Status: DC
  Administered 2021-06-25 – 2021-07-01 (×7): 50 mg via SUBCUTANEOUS
  Filled 2021-06-25 (×7): qty 0.6

## 2021-06-25 MED ORDER — CLOPIDOGREL BISULFATE 75 MG PO TABS
75.0000 mg | ORAL_TABLET | Freq: Every day | ORAL | Status: DC
  Administered 2021-06-25 – 2021-07-01 (×7): 75 mg via ORAL
  Filled 2021-06-25 (×7): qty 1

## 2021-06-25 NOTE — Care Management CC44 (Signed)
Condition Code 44 Documentation Completed  Patient Details  Name: SHAFI MORGANTE MRN: [id number] Date of Birth: [birth year]   Condition Code 44 given:  Yes Patient signature on Condition Code 44 notice:  Yes Documentation of 2 MD's agreement:  Yes Code 44 added to claim:  Yes    Sharin Mons, RN 06/25/2021, 4:17 PM

## 2021-06-25 NOTE — TOC CAGE-AID Note (Signed)
Transition of Care [facility]) - CAGE-AID Screening   Patient Details  Name: Jordan [name] MRN: [id number] Date of Birth: Mar 08, 1941  Transition of Care [facility]) CM/SW Contact:    Gaetano Hawthorne Tarpley-Carter, [place] Phone Number: 06/25/2021, 3:57 PM   Clinical Narrative: Pt participated in [place].  Pt stated he does not use substance or ETOH.  Pt was not offered resources, due to no usage of substance or ETOH.     Zellie Jenning Tarpley-Carter, MSW, LCSW-A Pronouns:  She/Her/Hers [facility]itions of Care Clinical Social Worker Direct Number:  [phone] [email or web address]  CAGE-AID Screening:    Have You Ever Felt You Ought to Cut Down on Your Drinking or Drug Use?: No Have People Annoyed You By [employer] Your Drinking Or Drug Use?: No Have You Felt Bad Or Guilty About Your Drinking Or Drug Use?: No Have You Ever Had a Drink or Used Drugs First Thing In The Morning to Steady Your Nerves or to Get Rid of a Hangover?: No CAGE-AID Score: 0  Substance Abuse Education Offered: No

## 2021-06-25 NOTE — Evaluation (Signed)
Physical Therapy Evaluation Patient Details Name: Jordan [name] MRN: [id number] DOB: [birth year] Today's Date: 06/25/2021   History of Present Illness  80yo male presenting on 06/23/21 after a fall at home, found to have L acetabular fracture. Ortho planning non-operative management. PMH LBP, heart disease, parkinsons, gout, HLD, HTN, narcolepsy, CVA, DM, fracture surgery, rotator cuff repair  Clinical Impression   Patient received in bed, very talkative and requiring redirection to task at hand. Refused physical assist to get to EOB resulting in extremely increased time and ultimately still needed ModAx2 to square up at side of bed today. Deferred attempts at standing- too weak to be able to stand while maintaining TDWB L LE, so tried lateral scoots but unable to perform this efficiently even with maxAx2 due to hard R lean in sitting as well as severe limitation of insight into deficits. Ultimately returned to bed with MaxAx2, positioned to comfort with all needs met and bed alarm active. Will most definitely require SNF at DC.     Follow Up Recommendations SNF;Supervision/Assistance - 24 hour    Equipment Recommendations  Hospital bed;Wheelchair (measurements PT);Wheelchair cushion (measurements PT)    Recommendations for Other Services       Precautions / Restrictions Precautions Precautions: Fall;Other (comment) Precaution Comments: VERY talkative/needs redirection, hard R lean Restrictions Weight Bearing Restrictions: Yes LLE Weight Bearing: Touchdown weight bearing      Mobility  Bed Mobility Overal bed mobility: Needs Assistance Bed Mobility: Supine to Sit;Sit to Supine;Rolling Rolling: Max assist   Supine to sit: Mod assist;+2 for physical assistance Sit to supine: Max assist;+2 for physical assistance   General bed mobility comments: modAx2 to manage trunk and BLEs/also to scoot forward to EOB, then MaxAx2 to return to supine due to hard R lean    Transfers Overall  transfer level: Needs assistance   Transfers: Lateral/Scoot Transfers          Lateral/Scoot Transfers: Max assist;+2 physical assistance General transfer comment: deferred attempts at standing due to hard R lean and safety concerns being able to maintain TDWB L LE; did try lateral scoots both right and left but required MaxAx2 either way  Ambulation/Gait             General Gait Details: deferred- safety  Stairs            Wheelchair Mobility    Modified Rankin (Stroke Patients Only)       Balance Overall balance assessment: Needs assistance Sitting-balance support: Bilateral upper extremity supported;Feet supported Sitting balance-Leahy Scale: Poor Sitting balance - Comments: hard right lean with limited insight or ability to correct Postural control: Right lateral lean                                   Pertinent Vitals/Pain Pain Assessment: No/denies pain    Home Living Family/patient expects to be discharged to:: Private residence Living Arrangements: Spouse/significant other;Children (lives with wife and son) Available Help at Discharge: Family Type of Home: Apartment (condo) Home Access: Elevator     Home Layout: One level Home Equipment: Other (comment);Shower seat (walking stick) Additional Comments: always uses walking stick outside of condo; reports he has been having lapses of short term memory    Prior Function Level of Independence: Independent with assistive device(s)         Comments: 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, works at coffee [place]     [employer]  Extremity/Trunk Assessment   Upper Extremity Assessment Upper Extremity Assessment: Defer to OT evaluation    Lower Extremity Assessment Lower Extremity Assessment: Generalized weakness    Cervical / Trunk Assessment Cervical / Trunk Assessment: Kyphotic  Communication   Communication: Expressive difficulties (slow to respond)  Cognition  Arousal/Alertness: Awake/alert Behavior During Therapy: WFL for tasks assessed/performed Overall Cognitive Status: No family/caregiver present to determine baseline cognitive functioning                                 General Comments: pleasant and conversational but reports frequent loss of memory recently, also says he has been very disoriented while in hospital; extremely talkative and needed frequent redirection with very little insight into deficits or safety      General Comments      Exercises     Assessment/Plan    PT Assessment Patient needs continued PT services  PT Problem List Decreased strength;Decreased cognition;Decreased knowledge of use of DME;Decreased activity tolerance;Decreased safety awareness;Decreased balance;Decreased knowledge of precautions;Decreased mobility;Decreased coordination       PT Treatment Interventions DME instruction;Balance training;Gait training;Functional mobility training;Patient/family education;Therapeutic activities;Therapeutic exercise;Wheelchair mobility training;Manual techniques    PT Goals (Current goals can be found in the Care Plan section)  Acute Rehab PT Goals Patient Stated Goal: stand up and go to OP PT PT Goal Formulation: With patient Time For Goal Achievement: 07/09/21 Potential to Achieve Goals: Fair    Frequency Min 2X/week   Barriers to discharge        Co-evaluation               AM-PAC PT "6 Clicks" Mobility  Outcome Measure Help needed turning from your back to your side while in a flat bed without using bedrails?: A Lot Help needed moving from lying on your back to sitting on the side of a flat bed without using bedrails?: Total Help needed moving to and from a bed to a chair (including a wheelchair)?: Total Help needed standing up from a chair using your arms (e.g., wheelchair or bedside chair)?: Total Help needed to walk in hospital room?: Total Help needed climbing 3-5 steps with a  railing? : Total 6 Click Score: 7    End of Session   Activity Tolerance: Patient tolerated treatment well Patient left: in bed;with call bell/phone within reach;with bed alarm set Nurse Communication: Mobility status;Precautions PT Visit Diagnosis: Unsteadiness on feet (R26.81);Other abnormalities of gait and mobility (R26.89);Difficulty in walking, not elsewhere classified (R26.2);Muscle weakness (generalized) (M62.81);History of falling (Z91.81)    Time: 1214-1315 PT Time Calculation (min) (ACUTE ONLY): 61 min   Charges:   PT Evaluation $PT Eval Moderate Complexity: 1 Mod (co-eval with OT) PT Treatments $Therapeutic Activity: 8-22 mins        U PT, DPT, PN2   Supplemental Physical Therapist [place]    Pager [phone] Acute Rehab Office [phone]    

## 2021-06-25 NOTE — Evaluation (Signed)
Occupational Therapy Evaluation Patient Details Name: Jordan [name] MRN: [id number] DOB: [birth year] Today's Date: 06/25/2021    History of Present Illness 80yo male presenting on 06/23/21 after a fall at home, found to have L acetabular fracture. Ortho planning non-operative management. PMH LBP, heart disease, parkinsons, gout, HLD, HTN, narcolepsy, CVA, DM, fracture surgery, rotator cuff repair   Clinical Impression   Pt admitted for concerns listed above. PTA pt reported that he was independent with all ADL's and IADL's, using only a walking stick out in the community. AT this time, pt is limited by Parkinson's symptoms, weakness, and balance concerns. Pt requiring mod-Max A +2 bed mobility and mod-max support to maintain upright balance sitting EOB. Pt balance is poor, as he leans hard to the R and posteriorly. Pt will benefit from SNF level therapies to maximize progress for functional mobility, safety, and balance. OT will follow acutely.    Follow Up Recommendations  SNF;Supervision/Assistance - 24 hour    Equipment Recommendations  Other (comment) (TBD)    Recommendations for Other Services       Precautions / Restrictions Precautions Precautions: Fall;Other (comment) Precaution Comments: VERY talkative/needs redirection, hard R lean Restrictions Weight Bearing Restrictions: Yes LLE Weight Bearing: Touchdown weight bearing      Mobility Bed Mobility Overal bed mobility: Needs Assistance Bed Mobility: Supine to Sit;Sit to Supine;Rolling Rolling: Max assist   Supine to sit: Mod assist;+2 for physical assistance Sit to supine: Max assist;+2 for physical assistance   General bed mobility comments: modAx2 to manage trunk and BLEs/also to scoot forward to EOB, then MaxAx2 to return to supine due to hard R lean    Transfers Overall transfer level: Needs assistance   Transfers: Lateral/Scoot Transfers          Lateral/Scoot Transfers: Max assist;+2 physical  assistance General transfer comment: deferred attempts at standing due to hard R lean and safety concerns being able to maintain TDWB L LE; did try lateral scoots both right and left but required MaxAx2 either way    Balance Overall balance assessment: Needs assistance Sitting-balance support: Bilateral upper extremity supported;Feet supported Sitting balance-Leahy Scale: Poor Sitting balance - Comments: hard right lean with limited insight or ability to correct Postural control: Right lateral lean                                 ADL either performed or assessed with clinical judgement   ADL Overall ADL's : Needs assistance/impaired Eating/Feeding: Set up;Bed level   Grooming: Set up;Bed level   Upper Body Bathing: Minimal assistance;Bed level   Lower Body Bathing: Maximal assistance;+2 for physical assistance;+2 for safety/equipment;Bed level   Upper Body Dressing : Minimal assistance;Bed level   Lower Body Dressing: Maximal assistance;Total assistance;+2 for physical assistance;+2 for safety/equipment;Bed level       Toileting- Clothing Manipulation and Hygiene: Maximal assistance;Total assistance;+2 for physical assistance;+2 for safety/equipment;Bed level         General ADL Comments: Pt limited to bed level/EOB due to balance concerns and weakness     Vision Baseline Vision/History: 1 Wears glasses Ability to See in Adequate Light: 0 Adequate Patient Visual Report: No change from baseline Vision Assessment?: No apparent visual deficits     Perception Perception Perception Tested?: No   Praxis Praxis Praxis tested?: Not tested    Pertinent Vitals/Pain Pain Assessment: No/denies pain     Hand Dominance Right   Extremity/Trunk Assessment Upper Extremity  Assessment Upper Extremity Assessment: Generalized weakness   Lower Extremity Assessment Lower Extremity Assessment: Defer to PT evaluation   Cervical / Trunk Assessment Cervical / Trunk  Assessment: Kyphotic   Communication Communication Communication: Expressive difficulties (slow to respond)   Cognition Arousal/Alertness: Awake/alert Behavior During Therapy: WFL for tasks assessed/performed Overall Cognitive Status: No family/caregiver present to determine baseline cognitive functioning                                 General Comments: pleasant and conversational but reports frequent loss of memory recently, also says he has been very disoriented while in hospital; extremely talkative and needed frequent redirection with very little insight into deficits or safety   General Comments  VSS on RA    Exercises     Shoulder Instructions      Home Living Family/patient expects to be discharged to:: Private residence Living Arrangements: Spouse/significant other;Children (lives with wife and son) Available Help at Discharge: Family Type of Home: Apartment (condo) Home Access: Elevator     Home Layout: One level     Bathroom Shower/Tub: Tub/shower unit;Walk-in shower   Bathroom Toilet: Handicapped height     Home Equipment: Other (comment);Shower seat (walking stick)   Additional Comments: always uses walking stick outside of condo; reports he has been having lapses of short term memory      Prior Functioning/Environment Level of Independence: Independent with assistive device(s)        Comments: 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, works at coffee [place]        [employer] Problem List: Decreased strength;Decreased activity tolerance;Impaired balance (sitting and/or standing);Decreased coordination;Decreased safety awareness;Decreased knowledge of use of DME or AE;Pain      OT Treatment/Interventions: Self-care/ADL training;Therapeutic exercise;Energy conservation;DME and/or AE instruction;Therapeutic activities;Patient/family education;Balance training    OT Goals(Current goals can be found in the care plan section) Acute Rehab OT Goals Patient Stated  Goal: To go home OT Goal Formulation: With patient Time For Goal Achievement: 07/09/21 Potential to Achieve Goals: Fair ADL Goals Pt Will Perform Grooming: with min assist;sitting Pt Will Perform Upper Body Bathing: with min assist;sitting Pt Will Perform Lower Body Bathing: with mod assist;sitting/lateral leans Pt Will Perform Upper Body Dressing: with min assist;sitting Pt Will Perform Lower Body Dressing: with mod assist;sitting/lateral leans Pt Will Transfer to Toilet: with mod assist;stand pivot transfer  OT Frequency: Min 2X/week   Barriers to D/C:            Co-evaluation              AM-PAC OT "6 Clicks" Daily Activity     Outcome Measure Help from another person eating meals?: A Little Help from another person taking care of personal grooming?: A Little Help from another person toileting, which includes using toliet, bedpan, or urinal?: Total Help from another person bathing (including washing, rinsing, drying)?: A Lot Help from another person to put on and taking off regular upper body clothing?: A Little Help from another person to put on and taking off regular lower body clothing?: A Lot 6 Click Score: 14   End of Session Nurse Communication: Mobility status  Activity Tolerance: Patient limited by fatigue Patient left: in bed;with call bell/phone within reach;with bed alarm set  OT Visit Diagnosis: Unsteadiness on feet (R26.81);Other abnormalities of gait and mobility (R26.89);Muscle weakness (generalized) (M62.81)                Time: [id number]  OT Time Calculation (min): 62 min Charges:  OT General Charges $OT Visit: 1 Visit OT Evaluation $OT Eval Moderate Complexity: 1 Mod OT Treatments $Therapeutic Activity: 8-22 mins  Khoa Opdahl H., OTR/L Acute Rehabilitation  Darcel Zick Elane Kinleigh Nault 06/25/2021, 9:07 PM

## 2021-06-25 NOTE — TOC Progression Note (Signed)
Transition of Care [facility]) - Progression Note    Patient Details  Name: Jordan [name] MRN: [id number] Date of Birth: [birth year]  Transition of [facility]) CM/SW Contact  Milinda Antis, [place] Phone Number: 06/25/2021, 4:21 PM  Clinical Narrative:     CSW received a consult for SNF placement at d/c.  CSW contacted the patient and patient's wife.  The wife reported that they were busy at this time and requested the CSW call back at a later time.    CSW will follow up with patient tomorrow.         Expected Discharge Plan and Services                                                 Social Determinants of Health (SDOH) Interventions    Readmission Risk Interventions No flowsheet data found.

## 2021-06-25 NOTE — Plan of Care (Signed)
  Problem: Education: Goal: Knowledge of General Education information will improve Description Including pain rating scale, medication(s)/side effects and non-pharmacologic comfort measures Outcome: Progressing   Problem: Nutrition: Goal: Adequate nutrition will be maintained Outcome: Progressing   Problem: Coping: Goal: Level of anxiety will decrease Outcome: Progressing   Problem: Elimination: Goal: Will not experience complications related to bowel motility Outcome: Progressing   Problem: Pain Managment: Goal: General experience of comfort will improve Outcome: Progressing   

## 2021-06-25 NOTE — Progress Notes (Addendum)
PROGRESS NOTE        PATIENT DETAILS Name: Jordan [name] Age: 80 y.o. Sex: male Date of Birth: 1941-04-30 Admit Date: 06/23/2021 Admitting Physician Jordan Mura, DO [id number], Jordan Saxon, MD  Brief Narrative: Patient is a 80 y.o. male with history of HF PEF, Parkinson's disease, bradycardia-s/p PPM placement, DM-2, OSA, narcolepsy-who presented following a mechanical fall-found to have left acetabular fracture.  Significant events: 8/23>> presented to [facility] ED following a fall-found to have left acetabular fracture.  Significant studies: 8/25>> CT pelvis: Comminuted mildly displaced fracture of left acetabulum, extending to left iliac bone and left superior pubic rami.  Left superior pubic rami fracture as well.  Small pelvic hematoma.  Antimicrobial therapy: None  Microbiology data: 8/23>> COVID PCR: Negative  Procedures : None  Consults: Orthopedics  DVT Prophylaxis : enoxaparin (LOVENOX) injection 40 mg Start: 06/25/21 1230 SCDs Start: 06/24/21 0019   Subjective: Pain in the left hip area.  Has not yet ambulated with therapy services.   Assessment/Plan: Bilateral pelvic fractures-communicated left acetabular fracture: Following a mechanical fall-orthopedics recommending nonsurgical management.  Await PT eval-Per orthopedics okay to be touchdown weightbearing.  HTN: BP stable-lisinopril remains on hold-resume when able.  HFpEF: Euvolemic-no indication for diuretics  DM-2: CBG stable-continue SSI-metformin on hold.  Recent Labs    06/24/21 1638 06/24/21 2323 06/25/21 0611  GLUCAP 136* 141* 125*    Hypomagnesemia: Repleted.  HLD: On statin  History of CVA: No focal deficits-resumed on Plavix since no surgery planned.  History of Parkinson's disease/parkinsonism: Followed by outpatient neurology-continue Sinemet.  History of narcolepsy/OSA: Continue  and CPAP nightly.  Obesity: Estimated body mass index is 32.98 kg/m as  calculated from the following:   Height as of this encounter: '5\' 8"'$  (1.727 m).   Weight as of this encounter: 98.4 kg.    Diet: Diet Order             Diet heart healthy/carb modified Room service appropriate? Yes; Fluid consistency: Thin  Diet effective now                    Code Status: Full code  Family Communication: Jordan [name] [phone] updated over the phone on 8/25  Disposition Plan: Status is: Inpatient  Remains inpatient appropriate because:Inpatient level of care appropriate due to severity of illness  Dispo: The patient is from: Home              Anticipated d/c is to: SNF              Patient currently is not medically stable to d/c.   Difficult to place patient No   Barriers to Discharge: Multiple pelvic fractures-/left occipital fracture-needs to work with rehab services   Antimicrobial agents: Anti-infectives (From admission, onward)    None        Time spent: 25 minutes-Greater than 50% of this time was spent in counseling, explanation of diagnosis, planning of further management, and coordination of care.  MEDICATIONS: Scheduled Meds:  acetaminophen  1,000 mg Oral Q8H   amphetamine-dextroamphetamine  10 mg Oral Q breakfast   atorvastatin  20 mg Oral Daily   carbidopa-levodopa  1 tablet Oral TID   clopidogrel  75 mg Oral Daily   enoxaparin (LOVENOX) injection  40 mg Subcutaneous Q24H   insulin aspart  0-6 Units Subcutaneous Q6H   sertraline  50 mg Oral Daily   Continuous Infusions: PRN Meds:.fentaNYL (SUBLIMAZE) injection, naLOXone (NARCAN)  injection, ondansetron (ZOFRAN) IV, oxyCODONE   PHYSICAL EXAM: Vital signs: Vitals:   06/24/21 2040 06/24/21 2300 06/25/21 0500 06/25/21 1000  BP: (!) 146/81   101/71  Pulse: 66 67  70  Resp: '15 16  15  '$ Temp: 99 F (37.2 C)   98 F (36.7 C)  TempSrc: Oral   Oral  SpO2: 92% 94%  91%  Weight:   98.4 kg   Height:       Filed Weights   06/24/21 1452 06/25/21 0500  Weight:  101.2 kg 98.4 kg   Body mass index is 32.98 kg/m.   Gen Exam:Alert awake-not in any distress HEENT:atraumatic, normocephalic Chest: B/L clear to auscultation anteriorly CVS:S1S2 regular Abdomen:soft non tender, non distended Extremities:no edema Neurology: Non focal Skin: no rash   I have personally reviewed following labs and imaging studies  LABORATORY DATA: CBC: Recent Labs  Lab 06/23/21 2344 06/24/21 0229 06/25/21 0422  WBC 14.4* 14.0* 11.5*  NEUTROABS 10.9* 10.5*  --   HGB 13.4 13.7 12.7*  HCT 41.7 41.4 38.6*  MCV 93.3 91.8 89.8  PLT 219 219 [id number]    Basic Metabolic Panel: Recent Labs  Lab 06/23/21 2344 06/24/21 0229 06/25/21 0422  NA 138 138 138  K 4.3 4.4 4.4  CL 106 107 103  CO2 '23 23 27  '$ GLUCOSE 124* 128* 127*  BUN '15 15 19  '$ CREATININE 0.96 0.98 1.00  CALCIUM 9.4 9.6 9.5  MG  --  1.4* 2.0    GFR: Estimated Creatinine Clearance: 65.9 mL/min (by C-G formula based on SCr of 1 mg/dL).  Liver Function Tests: Recent Labs  Lab 06/24/21 0229  AST 20  ALT 19  ALKPHOS 50  BILITOT 1.1  PROT 6.3*  ALBUMIN 3.5   No results for input(s): LIPASE, AMYLASE in the last 168 hours. No results for input(s): AMMONIA in the last 168 hours.  Coagulation Profile: Recent Labs  Lab 06/24/21 0229  INR 1.1    Cardiac Enzymes: No results for input(s): CKTOTAL, CKMB, CKMBINDEX, TROPONINI in the last 168 hours.  BNP (last 3 results) No results for input(s): PROBNP in the last 8760 hours.  Lipid Profile: No results for input(s): CHOL, HDL, LDLCALC, TRIG, CHOLHDL, LDLDIRECT in the last 72 hours.  Thyroid Function Tests: No results for input(s): TSH, T4TOTAL, FREET4, T3FREE, THYROIDAB in the last 72 hours.  Anemia Panel: No results for input(s): VITAMINB12, FOLATE, FERRITIN, TIBC, IRON, RETICCTPCT in the last 72 hours.  Urine analysis:    Component Value Date/Time   COLORURINE YELLOW 06/24/2021 0027   APPEARANCEUR CLEAR 06/24/2021 0027   LABSPEC 1.019  06/24/2021 0027   PHURINE 5.0 06/24/2021 0027   GLUCOSEU NEGATIVE 06/24/2021 0027   HGBUR MODERATE (A) 06/24/2021 0027   BILIRUBINUR NEGATIVE 06/24/2021 0027   KETONESUR NEGATIVE 06/24/2021 0027   PROTEINUR NEGATIVE 06/24/2021 0027   NITRITE NEGATIVE 06/24/2021 0027   LEUKOCYTESUR NEGATIVE 06/24/2021 0027    Sepsis Labs: Lactic Acid, Venous No results found for: LATICACIDVEN  MICROBIOLOGY: Recent Results (from the past 240 hour(s))  SARS CORONAVIRUS 2 (TAT 6-24 HRS) Nasopharyngeal Nasopharyngeal Swab     Status: None   Collection Time: 06/23/21 10:50 PM   Specimen: Nasopharyngeal Swab  Result Value Ref Range Status   SARS Coronavirus 2 NEGATIVE NEGATIVE Final    Comment: (NOTE) SARS-CoV-2 target nucleic acids are NOT DETECTED.  The SARS-CoV-2 RNA is generally detectable in upper and  lower respiratory specimens during the acute phase of infection. Negative results do not preclude SARS-CoV-2 infection, do not rule out co-infections with other pathogens, and should not be used as the sole basis for treatment or other patient management decisions. Negative results must be combined with clinical observations, patient history, and epidemiological information. The expected result is Negative.  Fact Sheet for Patients: SugarRoll.be  Fact Sheet for Healthcare Providers: https://www.woods-mathews.com/  This test is not yet approved or cleared by the [place] FDA and  has been authorized for detection and/or diagnosis of SARS-CoV-2 by FDA under an Emergency Use Authorization (EUA). This EUA will remain  in effect (meaning this test can be used) for the duration of the COVID-19 declaration under Se ction 564(b)(1) of the Act, 21 U.S.C. section 360bbb-3(b)(1), unless the authorization is terminated or revoked sooner.  Performed at [facility], [place] [address] [place], [place] [address]     RADIOLOGY STUDIES/RESULTS: CT  PELVIS WO CONTRAST  Result Date: 06/23/2021 CLINICAL DATA:  Pelvic trauma. EXAM: CT PELVIS WITHOUT CONTRAST TECHNIQUE: Multidetector CT imaging of the pelvis was performed following the standard protocol without intravenous contrast. COMPARISON:  Pelvic radiograph dated 06/23/2021. FINDINGS: Urinary Tract: The visualized distal ureters and urinary bladder appear unremarkable. Bowel: Sigmoid diverticulosis. No bowel dilatation within the pelvis. The appendix is unremarkable. Vascular/Lymphatic: Moderate atherosclerotic calcification. No pelvic adenopathy. Reproductive: The prostate and seminal vesicles are grossly unremarkable. No pelvic mass. Other:  None Musculoskeletal: There is a comminuted mildly displaced fracture of the proximal left superior pubic ramus extending into the anterior column and medial wall of the acetabulum. There is a nondisplaced fracture of the posterior left acetabular roofs contiguous with fracture of the left iliac bone. There is a nondisplaced fracture of the left superior pubic ramus adjacent to the symphysis previous. No femoral fractures. The bones are osteopenic. Degenerative changes of the lower lumbar spine. There is a small pelvic hematoma to the left of the bladder and along the left pelvic sidewall. There is mild mass effect on the bladder. IMPRESSION: 1. Comminuted mildly displaced fracture of the left acetabulum extending into the left iliac bone and proximal left superior pubic ramus. Additional fracture of the distal left superior pubic ramus adjacent to the symphysis previous. No femoral fracture. 2. Small pelvic hematoma to the left of the bladder and along the left pelvic sidewall. 3. Aortic Atherosclerosis (ICD10-I70.0). Electronically Signed   By: Anner Crete M.D.   On: 06/23/2021 22:58   DG Hip Unilat With Pelvis 2-3 Views Left  Result Date: 06/23/2021 CLINICAL DATA:  Left hip pain after fall EXAM: DG HIP (WITH OR WITHOUT PELVIS) 2-3V LEFT COMPARISON:  None.  FINDINGS: Frontal view of the pelvis as well as frontal and frogleg lateral views of the left hip are obtained. No acute fracture, subluxation, or dislocation. Mild symmetrical bilateral hip osteoarthritis. Sacroiliac joints are normal. Soft tissues are unremarkable. IMPRESSION: 1. Osteoarthritis.  No acute fracture. Electronically Signed   By: Randa Ngo M.D.   On: 06/23/2021 19:42     LOS: 1 day   Oren Binet, MD  [facility]    To contact the attending provider between 7A-7P or the covering provider during after hours 7P-7A, please log into the web site [email or web address] and access using universal [place] password for that web site. If you do not have the password, please call the hospital operator.  06/25/2021, 11:43 AM

## 2021-06-25 NOTE — Care Management Obs Status (Signed)
MEDICARE OBSERVATION STATUS NOTIFICATION   Patient Details  Name: CHE CISSE MRN: [id number] Date of Birth: Jan 05, 1941   Medicare Observation Status Notification Given:  Yes    Sharin Mons, RN 06/25/2021, 4:17 PM

## 2021-06-26 DIAGNOSIS — S32402A Unspecified fracture of left acetabulum, initial encounter for closed fracture: Secondary | ICD-10-CM | POA: Diagnosis not present

## 2021-06-26 DIAGNOSIS — E785 Hyperlipidemia, unspecified: Secondary | ICD-10-CM | POA: Diagnosis present

## 2021-06-26 DIAGNOSIS — I110 Hypertensive heart disease with heart failure: Secondary | ICD-10-CM | POA: Diagnosis present

## 2021-06-26 DIAGNOSIS — Y92008 Other place in unspecified non-institutional (private) residence as the place of occurrence of the external cause: Secondary | ICD-10-CM | POA: Diagnosis not present

## 2021-06-26 DIAGNOSIS — M6281 Muscle weakness (generalized): Secondary | ICD-10-CM | POA: Diagnosis not present

## 2021-06-26 DIAGNOSIS — I5032 Chronic diastolic (congestive) heart failure: Secondary | ICD-10-CM | POA: Diagnosis present

## 2021-06-26 DIAGNOSIS — R102 Pelvic and perineal pain: Secondary | ICD-10-CM | POA: Diagnosis present

## 2021-06-26 DIAGNOSIS — S3289XA Fracture of other parts of pelvis, initial encounter for closed fracture: Secondary | ICD-10-CM | POA: Diagnosis not present

## 2021-06-26 DIAGNOSIS — Z800 Family history of malignant neoplasm of digestive organs: Secondary | ICD-10-CM | POA: Diagnosis not present

## 2021-06-26 DIAGNOSIS — R41841 Cognitive communication deficit: Secondary | ICD-10-CM | POA: Diagnosis not present

## 2021-06-26 DIAGNOSIS — E559 Vitamin D deficiency, unspecified: Secondary | ICD-10-CM | POA: Diagnosis present

## 2021-06-26 DIAGNOSIS — I2510 Atherosclerotic heart disease of native coronary artery without angina pectoris: Secondary | ICD-10-CM | POA: Diagnosis present

## 2021-06-26 DIAGNOSIS — J069 Acute upper respiratory infection, unspecified: Secondary | ICD-10-CM | POA: Diagnosis not present

## 2021-06-26 DIAGNOSIS — F05 Delirium due to known physiological condition: Secondary | ICD-10-CM | POA: Diagnosis not present

## 2021-06-26 DIAGNOSIS — Z6833 Body mass index (BMI) 33.0-33.9, adult: Secondary | ICD-10-CM | POA: Diagnosis not present

## 2021-06-26 DIAGNOSIS — Z20818 Contact with and (suspected) exposure to other bacterial communicable diseases: Secondary | ICD-10-CM | POA: Diagnosis not present

## 2021-06-26 DIAGNOSIS — Z79899 Other long term (current) drug therapy: Secondary | ICD-10-CM | POA: Diagnosis not present

## 2021-06-26 DIAGNOSIS — Z7902 Long term (current) use of antithrombotics/antiplatelets: Secondary | ICD-10-CM | POA: Diagnosis not present

## 2021-06-26 DIAGNOSIS — E669 Obesity, unspecified: Secondary | ICD-10-CM | POA: Diagnosis present

## 2021-06-26 DIAGNOSIS — Z7984 Long term (current) use of oral hypoglycemic drugs: Secondary | ICD-10-CM | POA: Diagnosis not present

## 2021-06-26 DIAGNOSIS — E119 Type 2 diabetes mellitus without complications: Secondary | ICD-10-CM | POA: Diagnosis present

## 2021-06-26 DIAGNOSIS — Z87891 Personal history of nicotine dependence: Secondary | ICD-10-CM | POA: Diagnosis not present

## 2021-06-26 DIAGNOSIS — Z7401 Bed confinement status: Secondary | ICD-10-CM | POA: Diagnosis not present

## 2021-06-26 DIAGNOSIS — R262 Difficulty in walking, not elsewhere classified: Secondary | ICD-10-CM | POA: Diagnosis not present

## 2021-06-26 DIAGNOSIS — W010XXA Fall on same level from slipping, tripping and stumbling without subsequent striking against object, initial encounter: Secondary | ICD-10-CM | POA: Diagnosis present

## 2021-06-26 DIAGNOSIS — Z9989 Dependence on other enabling machines and devices: Secondary | ICD-10-CM | POA: Diagnosis not present

## 2021-06-26 DIAGNOSIS — D72828 Other elevated white blood cell count: Secondary | ICD-10-CM | POA: Diagnosis present

## 2021-06-26 DIAGNOSIS — R4182 Altered mental status, unspecified: Secondary | ICD-10-CM | POA: Diagnosis not present

## 2021-06-26 DIAGNOSIS — E1159 Type 2 diabetes mellitus with other circulatory complications: Secondary | ICD-10-CM | POA: Diagnosis not present

## 2021-06-26 DIAGNOSIS — S32472A Displaced fracture of medial wall of left acetabulum, initial encounter for closed fracture: Secondary | ICD-10-CM | POA: Diagnosis not present

## 2021-06-26 DIAGNOSIS — Z8673 Personal history of transient ischemic attack (TIA), and cerebral infarction without residual deficits: Secondary | ICD-10-CM | POA: Diagnosis not present

## 2021-06-26 DIAGNOSIS — S32492A Other specified fracture of left acetabulum, initial encounter for closed fracture: Secondary | ICD-10-CM | POA: Diagnosis present

## 2021-06-26 DIAGNOSIS — G4733 Obstructive sleep apnea (adult) (pediatric): Secondary | ICD-10-CM | POA: Diagnosis present

## 2021-06-26 DIAGNOSIS — G9341 Metabolic encephalopathy: Secondary | ICD-10-CM | POA: Diagnosis not present

## 2021-06-26 DIAGNOSIS — R2681 Unsteadiness on feet: Secondary | ICD-10-CM | POA: Diagnosis not present

## 2021-06-26 DIAGNOSIS — S32472D Displaced fracture of medial wall of left acetabulum, subsequent encounter for fracture with routine healing: Secondary | ICD-10-CM | POA: Diagnosis not present

## 2021-06-26 DIAGNOSIS — G20 Parkinson's disease: Secondary | ICD-10-CM | POA: Diagnosis present

## 2021-06-26 DIAGNOSIS — M25552 Pain in left hip: Secondary | ICD-10-CM | POA: Diagnosis not present

## 2021-06-26 DIAGNOSIS — I10 Essential (primary) hypertension: Secondary | ICD-10-CM | POA: Diagnosis not present

## 2021-06-26 DIAGNOSIS — Z20822 Contact with and (suspected) exposure to covid-19: Secondary | ICD-10-CM | POA: Diagnosis present

## 2021-06-26 DIAGNOSIS — S32392A Other fracture of left ilium, initial encounter for closed fracture: Secondary | ICD-10-CM | POA: Diagnosis present

## 2021-06-26 DIAGNOSIS — S32472S Displaced fracture of medial wall of left acetabulum, sequela: Secondary | ICD-10-CM | POA: Diagnosis not present

## 2021-06-26 DIAGNOSIS — S32592A Other specified fracture of left pubis, initial encounter for closed fracture: Secondary | ICD-10-CM | POA: Diagnosis present

## 2021-06-26 LAB — GLUCOSE, CAPILLARY
Glucose-Capillary: 113 mg/dL — ABNORMAL HIGH (ref 70–99)
Glucose-Capillary: 130 mg/dL — ABNORMAL HIGH (ref 70–99)
Glucose-Capillary: 144 mg/dL — ABNORMAL HIGH (ref 70–99)
Glucose-Capillary: 169 mg/dL — ABNORMAL HIGH (ref 70–99)

## 2021-06-26 MED ORDER — LISINOPRIL 5 MG PO TABS
5.0000 mg | ORAL_TABLET | Freq: Every day | ORAL | Status: DC
  Administered 2021-06-26 – 2021-07-01 (×6): 5 mg via ORAL
  Filled 2021-06-26 (×6): qty 1

## 2021-06-26 NOTE — NC FL2 (Signed)
[place] LEVEL OF CARE SCREENING TOOL     IDENTIFICATION  Patient Name: Jordan [name] Birthdate: 1940-12-04 Sex: male Admission Date (Current Location): 06/23/2021  [facility] and [place] Number:  Herbalist and Address:  [facility] [place], [place] [address], [place], [place] [address]      Provider Number: [id number]  Attending Physician Name and Address:  Jonetta Osgood, MD  Relative Name and Phone Number:  Shalev Ridenbaugh, [phone]    Current Level of Care: Hospital Recommended Level of Care: [place] Prior Approval Number:    Date Approved/Denied:   PASRR Number: [id number]  Discharge Plan: SNF    Current Diagnoses: Patient Active Problem List   Diagnosis Date Noted   Acetabulum fracture, left ([place]) 06/25/2021   Acetabular fracture ([place]) 06/24/2021   Acute hip pain, left 06/24/2021   Fall at home, initial encounter 06/24/2021   Leukocytosis 06/24/2021   Chronic diastolic heart failure ([place])    Sinusitis, acute maxillary 06/24/2020   AKI (acute kidney injury) ([place])    Labile blood glucose    Cough    Benign essential HTN    Skin lesion of hand    Hypoalbuminemia due to protein-calorie malnutrition ([place])    Controlled type 2 diabetes mellitus with hyperglycemia, without long-term current use of insulin ([place])    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   Morbid obesity ([place])    Acute blood loss anemia    Acute thalamic infarction ([place]) 04/15/2020   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   TIA (transient ischemic attack) 02/17/2018   DM2 (diabetes mellitus, type 2) ([place]) 02/17/2018   Insomnia 02/01/2018   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   Chronotropic incompetence 07/09/2014   Dyspnea on exertion 03/03/2014   HLD (hyperlipidemia) 12/13/2007   Essential hypertension 12/13/2007   OBESITY 12/05/2007   OSA on CPAP 12/05/2007   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   Coronary atherosclerosis 12/05/2007    Orientation RESPIRATION BLADDER Height & Weight     Self, Situation, Place  Normal, Other (Comment) (Nocturnal CPAP) Incontinent, External catheter Weight: 216 lb 14.9 oz (98.4 kg) Height:  '5\' 8"'$  (172.7 cm)  BEHAVIORAL SYMPTOMS/MOOD NEUROLOGICAL BOWEL NUTRITION STATUS      Incontinent Diet (See DC summary)  AMBULATORY STATUS COMMUNICATION OF NEEDS Skin   Extensive Assist Verbally Normal                       Personal Care Assistance Level of Assistance  Bathing, Feeding, Dressing Bathing Assistance: Maximum assistance Feeding assistance: Limited assistance Dressing Assistance: Maximum assistance     Functional Limitations Info  Sight, Hearing, Speech Sight Info: Impaired Hearing Info: Adequate Speech Info: Adequate    SPECIAL CARE FACTORS FREQUENCY  PT (By licensed PT), OT (By licensed OT)     PT Frequency: 5x week OT Frequency: 5x week            Contractures Contractures Info: Not present    Additional Factors Info  Code Status, Allergies, Psychotropic, Insulin Sliding Scale Code Status Info: Full Allergies Info: NKA Psychotropic Info: Zoloft, Adderall Insulin Sliding Scale Info: Insulin Aspart (Novolog) 0-6 U every 6 hours       Current Medications (06/26/2021):  This is the current hospital active medication list Current Facility-Administered Medications  Medication Dose Route Frequency Provider Last Rate Last Admin   acetaminophen (TYLENOL) tablet 1,000 mg  1,000 mg Oral Q8H Ghimire, Henreitta Leber, MD  1,000 mg at 06/26/21 [id number]   amphetamine-dextroamphetamine (ADDERALL) tablet 10 mg  10 mg Oral Q breakfast Jonetta Osgood, MD   10 mg at 06/26/21 [id number]   atorvastatin (LIPITOR) tablet 20 mg  20 mg Oral Daily Jonetta Osgood, MD   20 mg at 06/26/21 [id number]   carbidopa-levodopa (SINEMET IR) 25-100 MG per tablet immediate release 1 tablet  1 tablet Oral TID Jonetta Osgood, MD   1 tablet at 06/26/21 [id number]    clopidogrel (PLAVIX) tablet 75 mg  75 mg Oral Daily Jonetta Osgood, MD   75 mg at 06/26/21 0903   enoxaparin (LOVENOX) injection 50 mg  50 mg Subcutaneous Q24H Jonetta Osgood, MD   50 mg at 06/25/21 1324   fentaNYL (SUBLIMAZE) injection 25 mcg  25 mcg Intravenous Q2H PRN Ghimire, Henreitta Leber, MD       insulin aspart (novoLOG) injection 0-6 Units  0-6 Units Subcutaneous Q6H Howerter, Justin B, DO       naloxone (NARCAN) injection 0.4 mg  0.4 mg Intravenous PRN Howerter, Justin B, DO       ondansetron (ZOFRAN) injection 4 mg  4 mg Intravenous Q6H PRN Howerter, Justin B, DO       oxyCODONE (Oxy IR/ROXICODONE) immediate release tablet 5 mg  5 mg Oral Q6H PRN Ghimire, Henreitta Leber, MD       sertraline (ZOLOFT) tablet 50 mg  50 mg Oral Daily Jonetta Osgood, MD   50 mg at 06/26/21 [id number]     Discharge Medications: Please see discharge summary for a list of discharge medications.  Relevant Imaging Results:  Relevant Lab Results:   Additional Information SS# [id number] Covid vaccinated x [place]

## 2021-06-26 NOTE — Progress Notes (Signed)
Pt states he will place himself on VPAP when he is ready, he has home device set up in room. Pt resting comfortably on room air 95%. RT will cont to monitor.

## 2021-06-26 NOTE — TOC Initial Note (Signed)
Transition of Care [facility]) - Initial/Assessment Note    Patient Details  Name: Jordan [name] MRN: [id number] Date of Birth: August 23, 1941  [facility] CM/SW Contact:    Milinda Antis, [place] Phone Number: 06/26/2021, 11:18 AM  Clinical Narrative:                  CSW received consult for possible SNF placement at time of discharge. CSW spoke with the patient's wife.  The patient's wife reports that she would like for the patient to go to [facility].  CSW explained that this facility is full most of the time and very difficult for patient's to get in to.  The patient's wife reported that she still wants CSW to try.  The patient's wife then stated that she would only consider "4 or 5 star facilities".  CSW discussed insurance authorization process and will provide Medicare SNF ratings list. Patient has received  COVID vaccines x4.  The wife reports that if the patient does not get into a 4 or 5 star facility, she will need to rethink SNF placement.  Skilled Nursing Rehab Facilities-   [email or web address] Ratings out of 5 possible    Name Address  Phone # [place] [facility]  [facility] [address], [place] '5 1 4 4  '$ [facility]  [address] [place], [place] [phone] '3 1 5 4  '$ [facility] [place], [place] '3 1 1 1  '$ [place], [place] '2 2 4 4  '$ [facility] [address] [place] '3 1 2 1  '$ [place] '3 2 4 4  '$ [facility] [address], [place] '5 1 2 2  '$ [facility] [address] [place] '5 2 2 3  '$ [facility] [place] '5 1 2 2  '$ [facility] [address], [place] [phone] '5 1 2 2  '$ [facility] [address]  [facility] [phone] '5 1 2 2  '$ [place] [facility] [place] [id number] '3 1 1 1          '$ Patient Goals and CMS Choice        Expected Discharge Plan and Services                                                Prior Living Arrangements/Services                       Activities of Daily Living Home Assistive Devices/Equipment: CBG Meter, CPAP, Eyeglasses, Cane (specify quad or straight) (single) ADL Screening (condition at time of admission) Patient's cognitive ability adequate to safely complete daily activities?: Yes Is the patient deaf or have difficulty hearing?: Yes Does the patient have difficulty seeing, even when wearing glasses/contacts?: Yes Does the patient have difficulty concentrating, remembering, or making decisions?: Yes Patient able to express need for assistance with ADLs?: Yes Does the patient have difficulty dressing or bathing?: Yes Independently performs ADLs?: Yes (appropriate for developmental age) Does the patient have difficulty walking or climbing stairs?: Yes Weakness of Legs: Left Weakness of Arms/Hands: None  Permission Sought/Granted  Emotional Assessment              Admission diagnosis:  Acetabular fracture ([place]) [S32.409A] Fall, initial encounter [W19.XXXA] Closed displaced fracture of medial wall of left acetabulum, initial encounter ([place]) [S32.472A] Acetabulum fracture, left ([place]) [S32.402A] Patient Active Problem List   Diagnosis Date Noted   Acetabulum fracture, left ([place]) 06/25/2021   Acetabular fracture ([place]) 06/24/2021   Acute hip pain, left 06/24/2021   Fall at home, initial encounter 06/24/2021   Leukocytosis 06/24/2021   Chronic diastolic heart failure ([place])    Sinusitis, acute maxillary 06/24/2020   AKI (acute kidney injury) ([place])    Labile blood glucose    Cough    Benign essential HTN    Skin lesion of hand    Hypoalbuminemia due to  protein-calorie malnutrition ([place])    Controlled type 2 diabetes mellitus with hyperglycemia, without long-term current use of insulin ([place])    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   Morbid obesity ([place])    Acute blood loss anemia    Acute thalamic infarction ([place]) 04/15/2020   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[place]) 05/25/2019   TIA (transient ischemic attack) 02/17/2018   DM2 (diabetes mellitus, type 2) ([place]) 02/17/2018   Insomnia 02/01/2018   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   Chronotropic incompetence 07/09/2014   Dyspnea on exertion 03/03/2014   HLD (hyperlipidemia) 12/13/2007   Essential hypertension 12/13/2007   OBESITY 12/05/2007   OSA on CPAP 12/05/2007   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

## 2021-06-26 NOTE — Plan of Care (Signed)

## 2021-06-26 NOTE — Progress Notes (Addendum)
PROGRESS NOTE        PATIENT DETAILS Name: Jordan [name] Age: 80 y.o. Sex: male Date of Birth: 1941-01-08 Admit Date: 06/23/2021 Admitting Physician Evalee Mutton Kristeen Mans, MD [id number], Gwyndolyn Saxon, MD  Brief Narrative: Patient is a 80 y.o. male with history of HF PEF, Parkinson's disease, bradycardia-s/p PPM placement, DM-2, OSA, narcolepsy-who presented following a mechanical fall-found to have left acetabular fracture.  Significant events: 8/23>> presented to [facility] ED following a fall-found to have left acetabular fracture.  Significant studies: 8/23>> CT pelvis: Comminuted mildly displaced fracture of left acetabulum-extending into left iliac bone and left proximal superior pubic ramus.    Small pelvic hematoma.  Antimicrobial therapy: None  Microbiology data: 8/23>> COVID PCR: Negative  Procedures : None  Consults: Orthopedics  DVT Prophylaxis : SCDs Start: 06/24/21 0019   Subjective: History of significant pain in the left hip area.   Assessment/Plan: Bilateral pelvic fractures-communicated left acetabular fracture: Following a mechanical fall-orthopedics recommending nonsurgical management-evaluated by PT-recommendations are for SNF.  Social work following.  Per orthopedics-okay to be touchdown weightbearing.    HTN: BP creeping up-resume lisinopril.    HFpEF: Euvolemic-no indication for diuretics  DM-2: CBG stable-continue SSI-metformin on hold.  Recent Labs    06/26/21 0509 06/26/21 0821 06/26/21 1251  GLUCAP 113* 130* 144*     Hypomagnesemia: Repleted.  HLD: On statin  History of CVA: No focal deficits-resumed on Plavix since no surgery planned.  History of Parkinson's disease/parkinsonism: Followed by outpatient neurology-continue Sinemet.  History of narcolepsy/OSA: Continue  and CPAP nightly.  Obesity: Estimated body mass index is 32.98 kg/m as calculated from the following:   Height as of this encounter: '5\' 8"'$   (1.727 m).   Weight as of this encounter: 98.4 kg.    Diet: Diet Order             Diet heart healthy/carb modified Room service appropriate? Yes; Fluid consistency: Thin  Diet effective now                    Code Status: Full code  Family Communication: Dacotah Goad [phone] updated over the phone on 8/25  Disposition Plan: Status is: Inpatient  Remains inpatient appropriate because:Inpatient level of care appropriate due to severity of illness  Dispo: The patient is from: Home              Anticipated d/c is to: SNF              Patient currently is medically stable to d/c.   Difficult to place patient No   Barriers to Discharge: Awaiting SNF bed.  Antimicrobial agents: Anti-infectives (From admission, onward)    None        Time spent: 25 minutes-Greater than 50% of this time was spent in counseling, explanation of diagnosis, planning of further management, and coordination of care.  MEDICATIONS: Scheduled Meds:  acetaminophen  1,000 mg Oral Q8H   amphetamine-dextroamphetamine  10 mg Oral Q breakfast   atorvastatin  20 mg Oral Daily   carbidopa-levodopa  1 tablet Oral TID   clopidogrel  75 mg Oral Daily   enoxaparin (LOVENOX) injection  50 mg Subcutaneous Q24H   insulin aspart  0-6 Units Subcutaneous Q6H   sertraline  50 mg Oral Daily   Continuous Infusions: PRN Meds:.fentaNYL (SUBLIMAZE) injection, naLOXone (NARCAN)  injection, ondansetron (ZOFRAN) IV, oxyCODONE  PHYSICAL EXAM: Vital signs: Vitals:   06/25/21 2037 06/26/21 0415 06/26/21 0528 06/26/21 1340  BP: 139/89 127/72 (!) 142/82 (!) 160/94  Pulse: 69 70 67 67  Resp: '16 16 16 16  '$ Temp: 98.7 F (37.1 C) 98.9 F (37.2 C) 98.6 F (37 C) 97.7 F (36.5 C)  TempSrc: Oral Oral Oral Oral  SpO2: 94% 93% 94% 94%  Weight:      Height:       Filed Weights   06/24/21 1452 06/25/21 0500  Weight: 101.2 kg 98.4 kg   Body mass index is 32.98 kg/m.   Gen Exam:Alert awake-not  in any distress HEENT:atraumatic, normocephalic Chest: B/L clear to auscultation anteriorly CVS:S1S2 regular Abdomen:soft non tender, non distended Extremities:no edema Neurology: Non focal Skin: no rash   I have personally reviewed following labs and imaging studies  LABORATORY DATA: CBC: Recent Labs  Lab 06/23/21 2344 06/24/21 0229 06/25/21 0422  WBC 14.4* 14.0* 11.5*  NEUTROABS 10.9* 10.5*  --   HGB 13.4 13.7 12.7*  HCT 41.7 41.4 38.6*  MCV 93.3 91.8 89.8  PLT 219 219 212     Basic Metabolic Panel: Recent Labs  Lab 06/23/21 2344 06/24/21 0229 06/25/21 0422  NA 138 138 138  K 4.3 4.4 4.4  CL 106 107 103  CO2 '23 23 27  '$ GLUCOSE 124* 128* 127*  BUN '15 15 19  '$ CREATININE 0.96 0.98 1.00  CALCIUM 9.4 9.6 9.5  MG  --  1.4* 2.0     GFR: Estimated Creatinine Clearance: 65.9 mL/min (by C-G formula based on SCr of 1 mg/dL).  Liver Function Tests: Recent Labs  Lab 06/24/21 0229  AST 20  ALT 19  ALKPHOS 50  BILITOT 1.1  PROT 6.3*  ALBUMIN 3.5    No results for input(s): LIPASE, AMYLASE in the last 168 hours. No results for input(s): AMMONIA in the last 168 hours.  Coagulation Profile: Recent Labs  Lab 06/24/21 0229  INR 1.1     Cardiac Enzymes: No results for input(s): CKTOTAL, CKMB, CKMBINDEX, TROPONINI in the last 168 hours.  BNP (last 3 results) No results for input(s): PROBNP in the last 8760 hours.  Lipid Profile: No results for input(s): CHOL, HDL, LDLCALC, TRIG, CHOLHDL, LDLDIRECT in the last 72 hours.  Thyroid Function Tests: No results for input(s): TSH, T4TOTAL, FREET4, T3FREE, THYROIDAB in the last 72 hours.  Anemia Panel: No results for input(s): VITAMINB12, FOLATE, FERRITIN, TIBC, IRON, RETICCTPCT in the last 72 hours.  Urine analysis:    Component Value Date/Time   COLORURINE YELLOW 06/24/2021 0027   APPEARANCEUR CLEAR 06/24/2021 0027   LABSPEC 1.019 06/24/2021 0027   PHURINE 5.0 06/24/2021 0027   GLUCOSEU NEGATIVE  06/24/2021 0027   HGBUR MODERATE (A) 06/24/2021 0027   BILIRUBINUR NEGATIVE 06/24/2021 0027   KETONESUR NEGATIVE 06/24/2021 0027   PROTEINUR NEGATIVE 06/24/2021 0027   NITRITE NEGATIVE 06/24/2021 0027   LEUKOCYTESUR NEGATIVE 06/24/2021 0027    Sepsis Labs: Lactic Acid, Venous No results found for: LATICACIDVEN  MICROBIOLOGY: Recent Results (from the past 240 hour(s))  SARS CORONAVIRUS 2 (TAT 6-24 HRS) Nasopharyngeal Nasopharyngeal Swab     Status: None   Collection Time: 06/23/21 10:50 PM   Specimen: Nasopharyngeal Swab  Result Value Ref Range Status   SARS Coronavirus 2 NEGATIVE NEGATIVE Final    Comment: (NOTE) SARS-CoV-2 target nucleic acids are NOT DETECTED.  The SARS-CoV-2 RNA is generally detectable in upper and lower respiratory specimens during the acute phase of infection. Negative results do  not preclude SARS-CoV-2 infection, do not rule out co-infections with other pathogens, and should not be used as the sole basis for treatment or other patient management decisions. Negative results must be combined with clinical observations, patient history, and epidemiological information. The expected result is Negative.  Fact Sheet for Patients: SugarRoll.be  Fact Sheet for Healthcare Providers: https://www.woods-mathews.com/  This test is not yet approved or cleared by the [place] FDA and  has been authorized for detection and/or diagnosis of SARS-CoV-2 by FDA under an Emergency Use Authorization (EUA). This EUA will remain  in effect (meaning this test can be used) for the duration of the COVID-19 declaration under Se ction 564(b)(1) of the Act, 21 U.S.C. section 360bbb-3(b)(1), unless the authorization is terminated or revoked sooner.  Performed at [facility], [place] [address] [place], [place] [address]     RADIOLOGY STUDIES/RESULTS: No results found.   LOS: 1 day   Oren Binet, MD  [facility]    To contact the attending provider between 7A-7P or the covering provider during after hours 7P-7A, please log into the web site [email or web address] and access using universal [place] password for that web site. If you do not have the password, please call the hospital operator.  06/26/2021, 3:20 PM

## 2021-06-27 LAB — GLUCOSE, CAPILLARY
Glucose-Capillary: 116 mg/dL — ABNORMAL HIGH (ref 70–99)
Glucose-Capillary: 117 mg/dL — ABNORMAL HIGH (ref 70–99)
Glucose-Capillary: 128 mg/dL — ABNORMAL HIGH (ref 70–99)
Glucose-Capillary: 156 mg/dL — ABNORMAL HIGH (ref 70–99)

## 2021-06-27 MED ORDER — CALCIUM CARBONATE-VITAMIN D 500-200 MG-UNIT PO TABS
1.0000 | ORAL_TABLET | Freq: Two times a day (BID) | ORAL | Status: DC
  Administered 2021-06-27 – 2021-07-01 (×9): 1 via ORAL
  Filled 2021-06-27 (×9): qty 1

## 2021-06-27 NOTE — Progress Notes (Signed)
PROGRESS NOTE        PATIENT DETAILS Name: Jordan [name] Age: 80 y.o. Sex: male Date of Birth: 01-01-1941 Admit Date: 06/23/2021 Admitting Physician Evalee Mutton Kristeen Mans, MD [id number], Gwyndolyn Saxon, MD  Brief Narrative: Patient is a 80 y.o. male with history of HF PEF, Parkinson's disease, bradycardia-s/p PPM placement, DM-2, OSA, narcolepsy-who presented following a mechanical fall-found to have left acetabular fracture.  Evaluated by orthopedics with recommendations to pursue nonoperative management.  Subsequently seen by physical therapy with recommendations for SNF on discharge.  Currently awaiting SNF bed.  Significant events: 8/23>> presented to [facility] ED following a fall-found to have left acetabular fracture.  Significant studies: 8/23>> CT pelvis: Comminuted mildly displaced fracture of left acetabulum-extending into left iliac bone and left proximal superior pubic ramus.  Small pelvic hematoma.  Antimicrobial therapy: None  Microbiology data: 8/23>> COVID PCR: Negative  Procedures : None  Consults: Orthopedics  DVT Prophylaxis : SCDs Start: 06/24/21 0019   Subjective: Lying comfortably in bed-no major issues overnight.  Claims pain is stable.  Assessment/Plan: Bilateral pelvic fractures-communicated left acetabular fracture: Secondary to mechanical fall-Ortho recommending nonsurgical management.  Awaiting SNF bed.  Per Ortho-okay to be touchdown weightbearing.    HTN: BP stable-continue lisinopril.    HFpEF: Euvolemic-no indication for diuretics  DM-2: CBG stable-continue SSI-metformin on hold.  Recent Labs    06/26/21 1804 06/27/21 0006 06/27/21 0551  GLUCAP 169* 117* 116*     Hypomagnesemia: Repleted.  HLD: On statin  History of CVA: No focal deficits-resumed on Plavix since no surgery planned.  History of Parkinson's disease/parkinsonism: Followed by outpatient neurology-continue Sinemet.  History of narcolepsy/OSA:  Continue Adderall and CPAP nightly.  Obesity: Estimated body mass index is 32.78 kg/m as calculated from the following:   Height as of this encounter: '5\' 8"'$  (1.727 m).   Weight as of this encounter: 97.8 kg.    Diet: Diet Order             Diet heart healthy/carb modified Room service appropriate? Yes; Fluid consistency: Thin  Diet effective now                    Code Status: Full code  Family Communication: Marguette Condry [phone] updated over the phone on 8/27  Disposition Plan: Status is: Inpatient  Remains inpatient appropriate because:Inpatient level of care appropriate due to severity of illness  Dispo: The patient is from: Home              Anticipated d/c is to: SNF              Patient currently is medically stable to d/c.   Difficult to place patient No   Barriers to Discharge: Awaiting SNF bed-remains stable for discharge.  Social work following.  Antimicrobial agents: Anti-infectives (From admission, onward)    None        Time spent: 25 minutes-Greater than 50% of this time was spent in counseling, explanation of diagnosis, planning of further management, and coordination of care.  MEDICATIONS: Scheduled Meds:  acetaminophen  1,000 mg Oral Q8H   amphetamine-dextroamphetamine  10 mg Oral Q breakfast   atorvastatin  20 mg Oral Daily   carbidopa-levodopa  1 tablet Oral TID   clopidogrel  75 mg Oral Daily   enoxaparin (LOVENOX) injection  50 mg Subcutaneous Q24H   insulin aspart  0-6 Units Subcutaneous Q6H   lisinopril  5 mg Oral Daily   sertraline  50 mg Oral Daily   Continuous Infusions: PRN Meds:.fentaNYL (SUBLIMAZE) injection, naLOXone (NARCAN)  injection, ondansetron (ZOFRAN) IV, oxyCODONE   PHYSICAL EXAM: Vital signs: Vitals:   06/26/21 2120 06/27/21 0500 06/27/21 0525 06/27/21 0719  BP: 132/80  (!) 145/80 (!) 142/89  Pulse: 67  70 72  Resp: 16   17  Temp: 97.9 F (36.6 C)  98 F (36.7 C) 98.2 F (36.8 C)   TempSrc: Oral  Oral Oral  SpO2: 94%  95% 95%  Weight:  97.8 kg    Height:       Filed Weights   06/24/21 1452 06/25/21 0500 06/27/21 0500  Weight: 101.2 kg 98.4 kg 97.8 kg   Body mass index is 32.78 kg/m.   Gen Exam:Alert awake-not in any distress HEENT:atraumatic, normocephalic Chest: B/L clear to auscultation anteriorly CVS:S1S2 regular Abdomen:soft non tender, non distended Extremities:no edema Neurology: Non focal Skin: no rash   I have personally reviewed following labs and imaging studies  LABORATORY DATA: CBC: Recent Labs  Lab 06/23/21 2344 06/24/21 0229 06/25/21 0422  WBC 14.4* 14.0* 11.5*  NEUTROABS 10.9* 10.5*  --   HGB 13.4 13.7 12.7*  HCT 41.7 41.4 38.6*  MCV 93.3 91.8 89.8  PLT 219 219 212     Basic Metabolic Panel: Recent Labs  Lab 06/23/21 2344 06/24/21 0229 06/25/21 0422  NA 138 138 138  K 4.3 4.4 4.4  CL 106 107 103  CO2 '23 23 27  '$ GLUCOSE 124* 128* 127*  BUN '15 15 19  '$ CREATININE 0.96 0.98 1.00  CALCIUM 9.4 9.6 9.5  MG  --  1.4* 2.0     GFR: Estimated Creatinine Clearance: 65.7 mL/min (by C-G formula based on SCr of 1 mg/dL).  Liver Function Tests: Recent Labs  Lab 06/24/21 0229  AST 20  ALT 19  ALKPHOS 50  BILITOT 1.1  PROT 6.3*  ALBUMIN 3.5    No results for input(s): LIPASE, AMYLASE in the last 168 hours. No results for input(s): AMMONIA in the last 168 hours.  Coagulation Profile: Recent Labs  Lab 06/24/21 0229  INR 1.1     Cardiac Enzymes: No results for input(s): CKTOTAL, CKMB, CKMBINDEX, TROPONINI in the last 168 hours.  BNP (last 3 results) No results for input(s): PROBNP in the last 8760 hours.  Lipid Profile: No results for input(s): CHOL, HDL, LDLCALC, TRIG, CHOLHDL, LDLDIRECT in the last 72 hours.  Thyroid Function Tests: No results for input(s): TSH, T4TOTAL, FREET4, T3FREE, THYROIDAB in the last 72 hours.  Anemia Panel: No results for input(s): VITAMINB12, FOLATE, FERRITIN, TIBC, IRON,  RETICCTPCT in the last 72 hours.  Urine analysis:    Component Value Date/Time   COLORURINE YELLOW 06/24/2021 0027   APPEARANCEUR CLEAR 06/24/2021 0027   LABSPEC 1.019 06/24/2021 0027   PHURINE 5.0 06/24/2021 0027   GLUCOSEU NEGATIVE 06/24/2021 0027   HGBUR MODERATE (A) 06/24/2021 0027   BILIRUBINUR NEGATIVE 06/24/2021 0027   KETONESUR NEGATIVE 06/24/2021 0027   PROTEINUR NEGATIVE 06/24/2021 0027   NITRITE NEGATIVE 06/24/2021 0027   LEUKOCYTESUR NEGATIVE 06/24/2021 0027    Sepsis Labs: Lactic Acid, Venous No results found for: LATICACIDVEN  MICROBIOLOGY: Recent Results (from the past 240 hour(s))  SARS CORONAVIRUS 2 (TAT 6-24 HRS) Nasopharyngeal Nasopharyngeal Swab     Status: None   Collection Time: 06/23/21 10:50 PM   Specimen: Nasopharyngeal Swab  Result Value Ref Range Status  SARS Coronavirus 2 NEGATIVE NEGATIVE Final    Comment: (NOTE) SARS-CoV-2 target nucleic acids are NOT DETECTED.  The SARS-CoV-2 RNA is generally detectable in upper and lower respiratory specimens during the acute phase of infection. Negative results do not preclude SARS-CoV-2 infection, do not rule out co-infections with other pathogens, and should not be used as the sole basis for treatment or other patient management decisions. Negative results must be combined with clinical observations, patient history, and epidemiological information. The expected result is Negative.  Fact Sheet for Patients: SugarRoll.be  Fact Sheet for Healthcare Providers: https://www.woods-mathews.com/  This test is not yet approved or cleared by the [place] FDA and  has been authorized for detection and/or diagnosis of SARS-CoV-2 by FDA under an Emergency Use Authorization (EUA). This EUA will remain  in effect (meaning this test can be used) for the duration of the COVID-19 declaration under Se ction 564(b)(1) of the Act, 21 U.S.C. section 360bbb-3(b)(1), unless  the authorization is terminated or revoked sooner.  Performed at [facility], [place] [address], [place], [place] [address]     RADIOLOGY STUDIES/RESULTS: No results found.   LOS: 2 days   Oren Binet, MD  [facility]    To contact the attending provider between 7A-7P or the covering provider during after hours 7P-7A, please log into the web site [email or web address] and access using universal  password for that web site. If you do not have the password, please call the hospital operator.  06/27/2021, 10:41 AM

## 2021-06-27 NOTE — Plan of Care (Signed)

## 2021-06-27 NOTE — Progress Notes (Signed)
Pt has home CPAP at bedside, within reach. Pt states he is able to place on himself when ready for bed if he decides to use it. Hospital issued CPAP removed from room at this time. Advised pt to notify for RT if any further assistance is needed.

## 2021-06-27 NOTE — Progress Notes (Signed)
Pt self administered CPAP. Pt states if he feels like he needs CPAP tonight he will wear it. Pt is resting comfortably on room air sat 95%. RT will cont to monitor.

## 2021-06-28 LAB — GLUCOSE, CAPILLARY
Glucose-Capillary: 124 mg/dL — ABNORMAL HIGH (ref 70–99)
Glucose-Capillary: 161 mg/dL — ABNORMAL HIGH (ref 70–99)
Glucose-Capillary: 165 mg/dL — ABNORMAL HIGH (ref 70–99)
Glucose-Capillary: 215 mg/dL — ABNORMAL HIGH (ref 70–99)

## 2021-06-28 MED ORDER — MECLIZINE HCL 12.5 MG PO TABS
12.5000 mg | ORAL_TABLET | Freq: Three times a day (TID) | ORAL | Status: DC | PRN
  Filled 2021-06-28: qty 1

## 2021-06-28 MED ORDER — POLYETHYLENE GLYCOL 3350 17 G PO PACK
17.0000 g | PACK | Freq: Every day | ORAL | Status: DC
  Filled 2021-06-28: qty 1

## 2021-06-28 MED ORDER — SENNOSIDES-DOCUSATE SODIUM 8.6-50 MG PO TABS
2.0000 | ORAL_TABLET | Freq: Two times a day (BID) | ORAL | Status: DC
  Administered 2021-06-30: 2 via ORAL
  Filled 2021-06-28 (×5): qty 2

## 2021-06-28 NOTE — Plan of Care (Signed)

## 2021-06-28 NOTE — Progress Notes (Signed)
Patient stated he places himself on and off CPAP throughout the night without assistance. RT instructed patient to have RN call RT if assistance is needed. RT will monitor as needed.

## 2021-06-28 NOTE — Progress Notes (Signed)
PROGRESS NOTE        PATIENT DETAILS Name: Jordan [name] Age: 80 y.o. Sex: male Date of Birth: 1941/04/25 Admit Date: 06/23/2021 Admitting Physician Evalee Mutton Kristeen Mans, MD [id number], Gwyndolyn Saxon, MD  Brief Narrative: Patient is a 80 y.o. male with history of HF PEF, Parkinson's disease, bradycardia-s/p PPM placement, DM-2, OSA, narcolepsy-who presented following a mechanical fall-found to have left acetabular fracture.  Evaluated by orthopedics with recommendations to pursue nonoperative management.  Subsequently seen by physical therapy with recommendations for SNF on discharge.  Currently awaiting SNF bed.  Significant events: 8/23>> presented to [facility] ED following a fall-found to have left acetabular fracture.  Significant studies: 8/23>> CT pelvis: Comminuted mildly displaced fracture of left acetabulum-extending into left iliac bone and left proximal superior pubic ramus.  Small pelvic hematoma.  Antimicrobial therapy: None  Microbiology data: 8/23>> COVID PCR: Negative  Procedures : None  Consults: Orthopedics  DVT Prophylaxis : SCDs Start: 06/24/21 0019   Subjective: Patient complains of dizziness symptoms occasionally.  Feels as if the room is spinning around him.  Also admits to balance issues ever since his stroke last year.  Has not had a bowel movement in several days.  Denies any nausea vomiting or abdominal pain.  Assessment/Plan:  Bilateral pelvic fractures-communicated left acetabular fracture: Secondary to mechanical fall-Ortho recommending nonsurgical management.  Per Ortho-okay to be touchdown weightbearing.   Waiting on SNF.  Meclizine as needed for vertiginous symptoms.  PT and OT.  Essential hypertension: BP stable-continue lisinopril.    HFpEF: Euvolemic-no indication for diuretics  Diabetes mellitus type 2, controlled: CBG stable-continue SSI-metformin on hold.  Recent Labs    06/27/21 1812 06/28/21 0015 06/28/21 0612   GLUCAP 156* 161* 124*     Hypomagnesemia: Repleted.  Hyperlipidemia: On statin  History of CVA: No focal deficits-resumed Plavix since no surgery planned.  History of Parkinson's disease/parkinsonism: Followed by outpatient neurology-continue Sinemet.  History of narcolepsy/OSA: Continue Adderall and CPAP nightly.  Obesity: Estimated body mass index is 33.86 kg/m as calculated from the following:   Height as of this encounter: '5\' 8"'$  (1.727 m).   Weight as of this encounter: 101 kg.    Diet: Diet Order             Diet heart healthy/carb modified Room service appropriate? Yes; Fluid consistency: Thin  Diet effective now                    Code Status: Full code  Family Communication: Siyuan Googe [phone] updated over the phone on 8/27  Disposition Plan: Status is: Inpatient  Remains inpatient appropriate because:Inpatient level of care appropriate due to severity of illness  Dispo: The patient is from: Home              Anticipated d/c is to: SNF              Patient currently is medically stable to d/c.   Difficult to place patient No   Barriers to Discharge: Awaiting SNF bed-remains stable for discharge.  Social work following.  Antimicrobial agents: Anti-infectives (From admission, onward)    None         MEDICATIONS: Scheduled Meds:  acetaminophen  1,000 mg Oral Q8H   amphetamine-dextroamphetamine  10 mg Oral Q breakfast   atorvastatin  20 mg Oral Daily   calcium-vitamin D  1  tablet Oral BID   carbidopa-levodopa  1 tablet Oral TID   clopidogrel  75 mg Oral Daily   enoxaparin (LOVENOX) injection  50 mg Subcutaneous Q24H   insulin aspart  0-6 Units Subcutaneous Q6H   lisinopril  5 mg Oral Daily   sertraline  50 mg Oral Daily   Continuous Infusions: PRN Meds:.fentaNYL (SUBLIMAZE) injection, naLOXone (NARCAN)  injection, ondansetron (ZOFRAN) IV, oxyCODONE   PHYSICAL EXAM: Vital signs: Vitals:   06/27/21 1440 06/27/21 1948  06/28/21 0421 06/28/21 0835  BP: (!) 148/98 140/84 125/80 125/76  Pulse: 73 68 79 65  Resp: '18 18 16 18  '$ Temp: 98.5 F (36.9 C) 98.3 F (36.8 C) 98.4 F (36.9 C) 98.2 F (36.8 C)  TempSrc: Oral Oral Oral Oral  SpO2: 95% 91% 94% 94%  Weight:   101 kg   Height:       Filed Weights   06/25/21 0500 06/27/21 0500 06/28/21 0421  Weight: 98.4 kg 97.8 kg 101 kg   Body mass index is 33.86 kg/m.   General appearance: Awake alert.  In no distress Resp: Clear to auscultation bilaterally.  Normal effort Cardio: S1-S2 is normal regular.  No S3-S4.  No rubs murmurs or bruit GI: Abdomen is soft.  Nontender nondistended.  Bowel sounds are present normal.  No masses organomegaly Extremities: No significant edema.  Limited range of motion of the left lower extremity. Neurologic:  No focal neurological deficits.     I have personally reviewed following labs and imaging studies  LABORATORY DATA: CBC: Recent Labs  Lab 06/23/21 2344 06/24/21 0229 06/25/21 0422  WBC 14.4* 14.0* 11.5*  NEUTROABS 10.9* 10.5*  --   HGB 13.4 13.7 12.7*  HCT 41.7 41.4 38.6*  MCV 93.3 91.8 89.8  PLT 219 219 212     Basic Metabolic Panel: Recent Labs  Lab 06/23/21 2344 06/24/21 0229 06/25/21 0422  NA 138 138 138  K 4.3 4.4 4.4  CL 106 107 103  CO2 '23 23 27  '$ GLUCOSE 124* 128* 127*  BUN '15 15 19  '$ CREATININE 0.96 0.98 1.00  CALCIUM 9.4 9.6 9.5  MG  --  1.4* 2.0     GFR: Estimated Creatinine Clearance: 66.7 mL/min (by C-G formula based on SCr of 1 mg/dL).  Liver Function Tests: Recent Labs  Lab 06/24/21 0229  AST 20  ALT 19  ALKPHOS 50  BILITOT 1.1  PROT 6.3*  ALBUMIN 3.5    Coagulation Profile: Recent Labs  Lab 06/24/21 0229  INR 1.1      Urine analysis:    Component Value Date/Time   COLORURINE YELLOW 06/24/2021 0027   APPEARANCEUR CLEAR 06/24/2021 0027   LABSPEC 1.019 06/24/2021 0027   PHURINE 5.0 06/24/2021 0027   GLUCOSEU NEGATIVE 06/24/2021 0027   HGBUR MODERATE  (A) 06/24/2021 0027   BILIRUBINUR NEGATIVE 06/24/2021 0027   KETONESUR NEGATIVE 06/24/2021 0027   PROTEINUR NEGATIVE 06/24/2021 0027   NITRITE NEGATIVE 06/24/2021 0027   LEUKOCYTESUR NEGATIVE 06/24/2021 0027     MICROBIOLOGY: Recent Results (from the past 240 hour(s))  SARS CORONAVIRUS 2 (TAT 6-24 HRS) Nasopharyngeal Nasopharyngeal Swab     Status: None   Collection Time: 06/23/21 10:50 PM   Specimen: Nasopharyngeal Swab  Result Value Ref Range Status   SARS Coronavirus 2 NEGATIVE NEGATIVE Final    Comment: (NOTE) SARS-CoV-2 target nucleic acids are NOT DETECTED.  The SARS-CoV-2 RNA is generally detectable in upper and lower respiratory specimens during the acute phase of infection. Negative results do  not preclude SARS-CoV-2 infection, do not rule out co-infections with other pathogens, and should not be used as the sole basis for treatment or other patient management decisions. Negative results must be combined with clinical observations, patient history, and epidemiological information. The expected result is Negative.  Fact Sheet for Patients: SugarRoll.be  Fact Sheet for Healthcare Providers: https://www.woods-mathews.com/  This test is not yet approved or cleared by the [place] FDA and  has been authorized for detection and/or diagnosis of SARS-CoV-2 by FDA under an Emergency Use Authorization (EUA). This EUA will remain  in effect (meaning this test can be used) for the duration of the COVID-19 declaration under Se ction 564(b)(1) of the Act, 21 U.S.C. section 360bbb-3(b)(1), unless the authorization is terminated or revoked sooner.  Performed at [facility], [place] [address], [place], [place] [address]     RADIOLOGY STUDIES/RESULTS: No results found.   LOS: 3 days   Bonnielee Haff, MD  [facility]    To contact the attending provider between 7A-7P or the covering provider during after hours  7P-7A, please log into the web site [email or web address] and access using universal [place] password for that web site. If you do not have the password, please call the hospital operator.  06/28/2021, 10:14 AM

## 2021-06-29 LAB — GLUCOSE, CAPILLARY
Glucose-Capillary: 130 mg/dL — ABNORMAL HIGH (ref 70–99)
Glucose-Capillary: 137 mg/dL — ABNORMAL HIGH (ref 70–99)
Glucose-Capillary: 140 mg/dL — ABNORMAL HIGH (ref 70–99)
Glucose-Capillary: 144 mg/dL — ABNORMAL HIGH (ref 70–99)
Glucose-Capillary: 148 mg/dL — ABNORMAL HIGH (ref 70–99)

## 2021-06-29 MED ORDER — INSULIN ASPART 100 UNIT/ML IJ SOLN
0.0000 [IU] | Freq: Three times a day (TID) | INTRAMUSCULAR | Status: DC
  Administered 2021-06-29 – 2021-07-01 (×6): 1 [IU] via SUBCUTANEOUS

## 2021-06-29 NOTE — Progress Notes (Signed)
Physical Therapy Treatment Patient Details Name: Jordan [name] MRN: [id number] DOB: Aug 14, 1941 Today's Date: 06/29/2021    History of Present Illness 80yo male presenting on 06/23/21 after a fall at home, found to have L acetabular fracture. Ortho planning non-operative management. PMH LBP, heart disease, parkinsons, gout, HLD, HTN, narcolepsy, CVA, DM, fracture surgery, rotator cuff repair    PT Comments    Pt supine in bed on arrival.  He is disoriented to place.  He required max to total assistance this session.  Update frequency to 3x week to reflect policy.  Pt continues to benefit from skilled nursing placement for continued rehab to improve strength and function.  Will inform supervising PT of need for change in frequency at this time to reflect policy.      Follow Up Recommendations  SNF;Supervision/Assistance - 24 hour     Equipment Recommendations  Hospital bed;Wheelchair (measurements PT);Wheelchair cushion (measurements PT)    Recommendations for Other Services       Precautions / Restrictions Precautions Precautions: Fall;Other (comment) Precaution Comments: VERY talkative/needs redirection, poor balance Restrictions Weight Bearing Restrictions: Yes LLE Weight Bearing: Touchdown weight bearing    Mobility  Bed Mobility Overal bed mobility: Needs Assistance Bed Mobility: Supine to Sit;Sit to Supine     Supine to sit: Max assist;+2 for safety/equipment     General bed mobility comments: Pt required assistance to advance B LEs to edge of bed and elevate trunk into a seated position.  Pt presents with posterior LOB when sitting with unilateral support and attempting to tilt head to take pillls from RN.  Required support from behind to manage patient.    Transfers Overall transfer level: Needs assistance Equipment used: Rolling walker (2 wheeled);None Transfers: Sit to/from [employer] Sit to Stand: Mod assist;+2 safety/equipment   Squat pivot  transfers: Total assist;+2 safety/equipment     General transfer comment: Cues for sequencing and hand placement.  he was able to rise into standing from elevated bed and maintain TDWB on LLE.  He was too weak to pivot and required return to seated position and PTA performed squat pivot from low to high surface.  Pt performed pivot to his R side.  Ambulation/Gait Ambulation/Gait assistance:  (unable.)               Stairs             Wheelchair Mobility    Modified Rankin (Stroke Patients Only)       Balance Overall balance assessment: Needs assistance Sitting-balance support: Bilateral upper extremity supported;Feet supported Sitting balance-Leahy Scale: Poor Sitting balance - Comments: hard right lean with limited insight or ability to correct Postural control: Right lateral lean   Standing balance-Leahy Scale: Poor                              Cognition Arousal/Alertness: Awake/alert Behavior During Therapy: WFL for tasks assessed/performed Overall Cognitive Status: No family/caregiver present to determine baseline cognitive functioning                                 General Comments: Pt is disoriented to place this session.  He is speaking about meeting a doctor in a brick building but unable to recall that he is in the hospital.  Pt      Exercises General Exercises - Lower Extremity Ankle Circles/Pumps: AROM;Both;20 reps;Supine Quad Sets:  AROM;Both;10 reps;Supine Heel Slides: AAROM;Both;10 reps;Supine Hip ABduction/ADduction: AAROM;Both;10 reps;Supine    General Comments        Pertinent Vitals/Pain      Home Living                      Prior Function            PT Goals (current goals can now be found in the care plan section) Acute Rehab PT Goals Patient Stated Goal: To go home Potential to Achieve Goals: Fair Additional Goals Additional Goal #1: will be able to self propel WC at least 40f with  MinA Progress towards PT goals: Progressing toward goals    Frequency    Min 3X/week      PT Plan Current plan remains appropriate;Frequency needs to be updated    Co-evaluation              AM-PAC PT "6 Clicks" Mobility   Outcome Measure  Help needed turning from your back to your side while in a flat bed without using bedrails?: A Lot Help needed moving from lying on your back to sitting on the side of a flat bed without using bedrails?: A Lot Help needed moving to and from a bed to a chair (including a wheelchair)?: Total Help needed standing up from a chair using your arms (e.g., wheelchair or bedside chair)?: A Lot Help needed to walk in hospital room?: Total Help needed climbing 3-5 steps with a railing? : Total 6 Click Score: 9    End of Session Equipment Utilized During Treatment: Gait belt Activity Tolerance: Patient tolerated treatment well Patient left: with call bell/phone within reach;in chair;with chair alarm set Nurse Communication: Mobility status;Precautions PT Visit Diagnosis: Unsteadiness on feet (R26.81);Other abnormalities of gait and mobility (R26.89);Difficulty in walking, not elsewhere classified (R26.2);Muscle weakness (generalized) (M62.81);History of falling (Z91.81)     Time: [id number] Time Calculation (min) (ACUTE ONLY): 18 min  Charges:  $Therapeutic Activity: 8-22 mins                    AErasmo Leventhal, PTA Acute Rehabilitation Services Pager [phone] [phone]   Shanard Treto JEli Hose8/29/2022, 10:22 AM

## 2021-06-29 NOTE — Progress Notes (Addendum)
PROGRESS NOTE        PATIENT DETAILS Name: QAIS WESEMANN Age: 80 y.o. Sex: male Date of Birth: 08-Oct-1941 Admit Date: 06/23/2021 Admitting Physician Evalee Mutton Kristeen Mans, MD [id number], Gwyndolyn Saxon, MD  Brief Narrative: Patient is a 80 y.o. male with history of HF PEF, Parkinson's disease, bradycardia-s/p PPM placement, DM-2, OSA, narcolepsy-who presented following a mechanical fall-found to have left acetabular fracture.  Evaluated by orthopedics with recommendations to pursue nonoperative management.  Subsequently seen by physical therapy with recommendations for SNF on discharge.  Currently awaiting SNF bed.  Significant events: 8/23>> presented to [facility] ED following a fall-found to have left acetabular fracture.  Significant studies: 8/23>> CT pelvis: Comminuted mildly displaced fracture of left acetabulum-extending into left iliac bone and left proximal superior pubic ramus.  Small pelvic hematoma.  Antimicrobial therapy: None  Microbiology data: 8/23>> COVID PCR: Negative  Procedures : None  Consults: Orthopedics  DVT Prophylaxis : SCDs Start: 06/24/21 0019   Subjective: Patient mentions that he had a bowel movement yesterday.  No chest pain shortness of breath.  Pain in the left lower extremity is well controlled.  No new complaints offered   Assessment/Plan:  Bilateral pelvic fractures-communicated left acetabular fracture:  Secondary to mechanical fall-Ortho recommending nonsurgical management.  Per Ortho-okay to be touchdown weightbearing.   Waiting on SNF.  Meclizine as needed for vertiginous symptoms.  PT and OT. Low vitamin D levels were noted.  Noted to be on Os-Cal plus D.  Essential hypertension: Continue lisinopril.  Blood pressure is reasonably well controlled.  HFpEF: Euvolemic-no indication for diuretics  Diabetes mellitus type 2, controlled: CBG stable-continue SSI-metformin on hold.  Recent Labs    06/28/21 1750  06/29/21 0003 06/29/21 0616  GLUCAP 215* 137* 130*     Hypomagnesemia: Repleted.  Hyperlipidemia: On statin  History of CVA: No focal deficits-resumed Plavix since no surgery planned.  History of Parkinson's disease/parkinsonism: Followed by outpatient neurology-continue Sinemet.  History of narcolepsy/OSA: Continue Adderall and CPAP nightly.  Constipation: Likely due to immobility.  Started on bowel regimen yesterday.  Patient reports that he had a BM yesterday.  Obesity: Estimated body mass index is 32.78 kg/m as calculated from the following:   Height as of this encounter: '5\' 8"'$  (1.727 m).   Weight as of this encounter: 97.8 kg.   ADDENDUM Spoke to patient's wife. Apparently patient has been more confused today per nursing staff. No focal deficits per nursing staff. Medications reviewed. Hasn't received any narcotics. Noted to be afebrile. Will check UA and labs. Wife asking about imaging studies. May not be unreasonable to do Ct head. Will hold Zoloft for now. Will not hold Sinemet. Patient with cognitive deficits at baseline. This is probably hospital delirium.   Diet: Diet Order             Diet heart healthy/carb modified Room service appropriate? Yes; Fluid consistency: Thin  Diet effective now                    Code Status: Full code  Family Communication: Wyonia Hough [phone].  Disposition Plan: Status is: Inpatient  Remains inpatient appropriate because:Inpatient level of care appropriate due to severity of illness  Dispo: The patient is from: Home              Anticipated d/c is to: SNF  Patient currently is medically stable to d/c.   Difficult to place patient No   Barriers to Discharge: Awaiting SNF bed-remains stable for discharge.  Social work following.  Antimicrobial agents: Anti-infectives (From admission, onward)    None         MEDICATIONS: Scheduled Meds:  acetaminophen  1,000 mg Oral Q8H    amphetamine-dextroamphetamine  10 mg Oral Q breakfast   atorvastatin  20 mg Oral Daily   calcium-vitamin D  1 tablet Oral BID   carbidopa-levodopa  1 tablet Oral TID   clopidogrel  75 mg Oral Daily   enoxaparin (LOVENOX) injection  50 mg Subcutaneous Q24H   insulin aspart  0-6 Units Subcutaneous Q6H   lisinopril  5 mg Oral Daily   polyethylene glycol  17 g Oral Daily   senna-docusate  2 tablet Oral BID   sertraline  50 mg Oral Daily   Continuous Infusions: PRN Meds:.fentaNYL (SUBLIMAZE) injection, meclizine, naLOXone (NARCAN)  injection, ondansetron (ZOFRAN) IV, oxyCODONE   PHYSICAL EXAM: Vital signs: Vitals:   06/28/21 1950 06/28/21 2045 06/29/21 0518 06/29/21 0700  BP:  105/71 107/90 124/71  Pulse: 76 67 66 66  Resp: '16 18 19 18  '$ Temp:  98.4 F (36.9 C) 99 F (37.2 C) 98.1 F (36.7 C)  TempSrc:   Oral Oral  SpO2: 96% 94% 99% 95%  Weight:   97.8 kg   Height:       Filed Weights   06/27/21 0500 06/28/21 0421 06/29/21 0518  Weight: 97.8 kg 101 kg 97.8 kg   Body mass index is 32.78 kg/m.   General appearance: Awake alert.  In no distress Resp: Clear to auscultation bilaterally.  Normal effort Cardio: S1-S2 is normal regular.  No S3-S4.  No rubs murmurs or bruit GI: Abdomen is soft.  Nontender nondistended.  Bowel sounds are present normal.  No masses organomegaly Extremities: No edema.  Full range of motion of lower extremities. Neurologic: No focal neurological deficits.    I have personally reviewed following labs and imaging studies  LABORATORY DATA: CBC: Recent Labs  Lab 06/23/21 2344 06/24/21 0229 06/25/21 0422  WBC 14.4* 14.0* 11.5*  NEUTROABS 10.9* 10.5*  --   HGB 13.4 13.7 12.7*  HCT 41.7 41.4 38.6*  MCV 93.3 91.8 89.8  PLT 219 219 212     Basic Metabolic Panel: Recent Labs  Lab 06/23/21 2344 06/24/21 0229 06/25/21 0422  NA 138 138 138  K 4.3 4.4 4.4  CL 106 107 103  CO2 '23 23 27  '$ GLUCOSE 124* 128* 127*  BUN '15 15 19  '$ CREATININE 0.96  0.98 1.00  CALCIUM 9.4 9.6 9.5  MG  --  1.4* 2.0     GFR: Estimated Creatinine Clearance: 65.7 mL/min (by C-G formula based on SCr of 1 mg/dL).  Liver Function Tests: Recent Labs  Lab 06/24/21 0229  AST 20  ALT 19  ALKPHOS 50  BILITOT 1.1  PROT 6.3*  ALBUMIN 3.5    Coagulation Profile: Recent Labs  Lab 06/24/21 0229  INR 1.1      Urine analysis:    Component Value Date/Time   COLORURINE YELLOW 06/24/2021 0027   APPEARANCEUR CLEAR 06/24/2021 0027   LABSPEC 1.019 06/24/2021 0027   PHURINE 5.0 06/24/2021 0027   GLUCOSEU NEGATIVE 06/24/2021 0027   HGBUR MODERATE (A) 06/24/2021 0027   BILIRUBINUR NEGATIVE 06/24/2021 0027   KETONESUR NEGATIVE 06/24/2021 0027   PROTEINUR NEGATIVE 06/24/2021 0027   NITRITE NEGATIVE 06/24/2021 0027   LEUKOCYTESUR NEGATIVE  06/24/2021 0027     MICROBIOLOGY: Recent Results (from the past 240 hour(s))  SARS CORONAVIRUS 2 (TAT 6-24 HRS) Nasopharyngeal Nasopharyngeal Swab     Status: None   Collection Time: 06/23/21 10:50 PM   Specimen: Nasopharyngeal Swab  Result Value Ref Range Status   SARS Coronavirus 2 NEGATIVE NEGATIVE Final    Comment: (NOTE) SARS-CoV-2 target nucleic acids are NOT DETECTED.  The SARS-CoV-2 RNA is generally detectable in upper and lower respiratory specimens during the acute phase of infection. Negative results do not preclude SARS-CoV-2 infection, do not rule out co-infections with other pathogens, and should not be used as the sole basis for treatment or other patient management decisions. Negative results must be combined with clinical observations, patient history, and epidemiological information. The expected result is Negative.  Fact Sheet for Patients: SugarRoll.be  Fact Sheet for Healthcare Providers: https://www.woods-mathews.com/  This test is not yet approved or cleared by the [place] FDA and  has been authorized for detection and/or diagnosis of  SARS-CoV-2 by FDA under an Emergency Use Authorization (EUA). This EUA will remain  in effect (meaning this test can be used) for the duration of the COVID-19 declaration under Se ction 564(b)(1) of the Act, 21 U.S.C. section 360bbb-3(b)(1), unless the authorization is terminated or revoked sooner.  Performed at [facility], [place] [address] [place], [place] [address]     RADIOLOGY STUDIES/RESULTS: No results found.   LOS: 3 days   Bonnielee Haff, MD  [facility]    To contact the attending provider between 7A-7P or the covering provider during after hours 7P-7A, please log into the web site [email or web address] and access using universal [place] password for that web site. If you do not have the password, please call the hospital operator.  06/29/2021, 9:51 AM

## 2021-06-29 NOTE — Progress Notes (Signed)
Occupational Therapy Treatment Patient Details Name: JAMUAL BASS MRN: [id number] DOB: 14-Sep-1941 Today's Date: 06/29/2021    History of present illness 80yo male presenting on 06/23/21 after a fall at home, found to have L acetabular fracture. Ortho planning non-operative management. PMH LBP, heart disease, parkinsons, gout, HLD, HTN, narcolepsy, CVA, DM, fracture surgery, rotator cuff repair   OT comments  Upon arrival, RN with safety concerns with pt sliding out of his chair as his hips were close to the edge. Pt required max A +2 to boost his bottom back into the chair, he needed max verbal and tactile cues for positioning to assist. Throughout session, Axel demonstrated a severe posterior lean and was unable to self correct or sustain an sup right sitting posture. He was max +2 to laterally scoot from chair>bed with use of chuck pads. Pt disoriented and tangential throughout session and very difficult to redirect to task. He continues to benefit from continued OT acutely. D/c remains appropriate.    Follow Up Recommendations  SNF;Supervision/Assistance - 24 hour    Equipment Recommendations  Other (comment)       Precautions / Restrictions Precautions Precautions: Fall;Other (comment) Precaution Comments: VERY talkative/needs redirection, poor balance       Mobility Bed Mobility Overal bed mobility: Needs Assistance Bed Mobility: Sit to Supine     Supine to sit: Max assist;+2 for safety/equipment     General bed mobility comments: Pt required assist to advance BLE from floor to bed, trunk control and repositioning in bed.    Transfers Overall transfer level: Needs assistance              Lateral/Scoot Transfers: Max assist;+2 physical assistance;+2 safety/equipment General transfer comment: max cues for all sequencing, max A +2 for physical lifting and boosting. Pt with severe posterior bias/lean which cause safety concern throughout    Balance Overall balance  assessment: Needs assistance Sitting-balance support: Bilateral upper extremity supported;Feet supported Sitting balance-Leahy Scale: Poor Sitting balance - Comments: posterior bias, created safety concern for fallin gout of the chair     Standing balance-Leahy Scale: Zero                 ADL either performed or assessed with clinical judgement   ADL Overall ADL's : Needs assistance/impaired               Toilet Transfer: Maximal assistance;+2 for physical assistance;+2 for safety/equipment;Squat-pivot;Requires drop arm Toilet Transfer Details (indicate cue type and reason): simpulated from chair>bed. pt limited by poor trunk control and difficulty sequencing/follwing directions         Functional mobility during ADLs: Maximal assistance;+2 for physical assistance (limited to lateral scooting only) General ADL Comments: session focued on chair>bed transfer. pt required constant re-direction, verbal and tactile cues. he is limited by posterior bias and difficulty coordinating task      Cognition Arousal/Alertness: Awake/alert Behavior During Therapy: WFL for tasks assessed/performed Overall Cognitive Status: No family/caregiver present to determine baseline cognitive functioning               General Comments: pt disoriented to place and time, tangital throughout session and difficult to re-direct              General Comments VSS on RA. Pt tangential throughut session adn difficult to re-direct to session; speaking about old workout routine and changing rooms    Pertinent Vitals/ Pain       Pain Assessment: Faces Faces Pain Scale: Hurts little more Pain Location:  LLE with movement Pain Descriptors / Indicators: Discomfort;Grimacing;Guarding Pain Intervention(s): Limited activity within patient's tolerance;Repositioned;Monitored during session   Frequency  Min 2X/week        Progress Toward Goals  OT Goals(current goals can now be found in the  care plan section)  Progress towards OT goals: Progressing toward goals  Acute Rehab OT Goals Patient Stated Goal: To go home OT Goal Formulation: With patient Time For Goal Achievement: 07/09/21 Potential to Achieve Goals: Fair ADL Goals Pt Will Perform Grooming: with min assist;sitting Pt Will Perform Upper Body Bathing: with min assist;sitting Pt Will Perform Lower Body Bathing: with mod assist;sitting/lateral leans Pt Will Perform Upper Body Dressing: with min assist;sitting Pt Will Perform Lower Body Dressing: with mod assist;sitting/lateral leans Pt Will Transfer to Toilet: with mod assist;stand pivot transfer  Plan Discharge plan remains appropriate    Co-evaluation                 AM-PAC OT "6 Clicks" Daily Activity     Outcome Measure   Help from another person eating meals?: A Little Help from another person taking care of personal grooming?: A Little Help from another person toileting, which includes using toliet, bedpan, or urinal?: Total Help from another person bathing (including washing, rinsing, drying)?: A Lot Help from another person to put on and taking off regular upper body clothing?: A Little Help from another person to put on and taking off regular lower body clothing?: A Lot 6 Click Score: 14    End of Session    OT Visit Diagnosis: Unsteadiness on feet (R26.81);Other abnormalities of gait and mobility (R26.89);Muscle weakness (generalized) (M62.81)   Activity Tolerance Patient limited by fatigue   Patient Left in bed;with call bell/phone within reach;with bed alarm set   Nurse Communication Precautions;Weight bearing status;Mobility status        Time: [id number] OT Time Calculation (min): 25 min  Charges: OT General Charges $OT Visit: 1 Visit OT Treatments $Therapeutic Activity: 23-37 mins    Arika Mainer A Pavle Wiler 06/29/2021, 4:10 PM

## 2021-06-29 NOTE — TOC Progression Note (Signed)
Transition of Care [facility]) - Progression Note    Patient Details  Name: Jordan [name] MRN: [id number] Date of Birth: January 03, 1941  Transition of Care [facility]) CM/SW Contact  Milinda Antis, [place] Phone Number: 06/29/2021, 10:57 AM  Clinical Narrative:     CSW contacted the patient's wife and presented the list of the facilities that are able to accept the patient.  The patient's wife wants to tour the facilities prior to making a decision.    CSW spoke with the patient who stated that he wanted to go to a rehab facility as soon as possible.   CSW contacted [facility] and [facility] and both SNFs are unable to accept the patient.    Pending: Facility choice and insurance auth.         Expected Discharge Plan and Services                                                 Social Determinants of Health (SDOH) Interventions    Readmission Risk Interventions No flowsheet data found.

## 2021-06-29 NOTE — Plan of Care (Signed)

## 2021-06-30 ENCOUNTER — Inpatient Hospital Stay (HOSPITAL_COMMUNITY): Payer: Medicare HMO

## 2021-06-30 LAB — COMPREHENSIVE METABOLIC PANEL
ALT: <5 U/L (ref 0–44)
AST: 19 U/L (ref 15–41)
Albumin: 3.3 g/dL — ABNORMAL LOW (ref 3.5–5.0)
Alkaline Phosphatase: 58 U/L (ref 38–126)
Anion gap: 10 (ref 5–15)
BUN: 28 mg/dL — ABNORMAL HIGH (ref 8–23)
CO2: 23 mmol/L (ref 22–32)
Calcium: 9.8 mg/dL (ref 8.9–10.3)
Chloride: 103 mmol/L (ref 98–111)
Creatinine, Ser: 0.97 mg/dL (ref 0.61–1.24)
GFR, Estimated: >60 mL/min (ref >60)
Glucose, Bld: 121 mg/dL — ABNORMAL HIGH (ref 70–99)
Potassium: 4.1 mmol/L (ref 3.5–5.1)
Sodium: 136 mmol/L (ref 135–145)
Total Bilirubin: 0.9 mg/dL (ref 0.3–1.2)
Total Protein: 6.6 g/dL (ref 6.5–8.1)

## 2021-06-30 LAB — CBC
HCT: 39.7 % (ref 39.0–52.0)
Hemoglobin: 13.6 g/dL (ref 13.0–17.0)
MCH: 30.1 pg (ref 26.0–34.0)
MCHC: 34.3 g/dL (ref 30.0–36.0)
MCV: 87.8 fL (ref 80.0–100.0)
Platelets: 260 10*3/uL (ref 150–400)
RBC: 4.52 MIL/uL (ref 4.22–5.81)
RDW: 13.2 % (ref 11.5–15.5)
WBC: 12.6 10*3/uL — ABNORMAL HIGH (ref 4.0–10.5)
nRBC: 0 % (ref 0.0–0.2)

## 2021-06-30 LAB — GLUCOSE, CAPILLARY
Glucose-Capillary: 122 mg/dL — ABNORMAL HIGH (ref 70–99)
Glucose-Capillary: 131 mg/dL — ABNORMAL HIGH (ref 70–99)
Glucose-Capillary: 139 mg/dL — ABNORMAL HIGH (ref 70–99)
Glucose-Capillary: 122 mg/dL — ABNORMAL HIGH (ref 70–99)
Glucose-Capillary: 202 mg/dL — ABNORMAL HIGH (ref 70–99)

## 2021-06-30 MED ORDER — TRAMADOL HCL 50 MG PO TABS
50.0000 mg | ORAL_TABLET | Freq: Four times a day (QID) | ORAL | Status: DC | PRN
Start: 2021-06-30 — End: 2021-07-01

## 2021-06-30 NOTE — TOC Progression Note (Addendum)
Transition of Care [facility]) - Progression Note    Patient Details  Name: Jordan [name] MRN: [id number] Date of Birth: May 25, 1941  Transition of [facility]) CM/SW Contact  Milinda Antis, [place] Phone Number: 06/30/2021, 8:43 AM  Clinical Narrative:    CSW was contacted by the patient's wife and informed that the family's facility choice is [facility].    CSW contacted Kitty in admissions at [facility] and was informed that they can accept the patient.  The facility will complete insurance authorization.  Pending: insurance auth.  14:20-  CSW informed that the facility has received insurance authorization and can accept the patient today.  CSW notified attending who reports that the patient is not medically ready today.  Facility notified.          Expected Discharge Plan and Services                                                 Social Determinants of Health (SDOH) Interventions    Readmission Risk Interventions No flowsheet data found.

## 2021-06-30 NOTE — Progress Notes (Signed)
PROGRESS NOTE        PATIENT DETAILS Name: Jordan [name] Age: 80 y.o. Sex: male Date of Birth: 1941-09-12 Admit Date: 06/23/2021 Admitting Physician Evalee Mutton Kristeen Mans, MD [id number], Gwyndolyn Saxon, MD  Brief Narrative: Patient is a 80 y.o. male with history of HF PEF, Parkinson's disease, bradycardia-s/p PPM placement, DM-2, OSA, narcolepsy-who presented following a mechanical fall-found to have left acetabular fracture.  Evaluated by orthopedics with recommendations to pursue nonoperative management.  Subsequently seen by physical therapy with recommendations for SNF on discharge.  Currently awaiting SNF bed.  Significant events: 8/23>> presented to [facility] ED following a fall-found to have left acetabular fracture.  Significant studies: 8/23>> CT pelvis: Comminuted mildly displaced fracture of left acetabulum-extending into left iliac bone and left proximal superior pubic ramus.  Small pelvic hematoma.  Antimicrobial therapy: None  Microbiology data: 8/23>> COVID PCR: Negative  Procedures : None  Consults: Orthopedics  DVT Prophylaxis : SCDs Start: 06/24/21 0019   Subjective: Patient in bed appears to be in no distress slightly somnolent this morning, able to follow basic commands but will answer questions unreliably, denies any headache.  Assessment/Plan:  Bilateral pelvic fractures-communicated left acetabular fracture: Secondary to mechanical fall-Ortho recommending nonsurgical management.  Per Ortho-okay to be touchdown weightbearing.  Will require SNF.  Continue Os-Cal plus vitamin D supplement.  Vitamin D levels were slightly low upon admission, will have him follow-up with PCP for further monitoring.  Essential hypertension: Continue lisinopril.  Blood pressure is reasonably well controlled.  HFpEF: Euvolemic-no indication for diuretics  Diabetes mellitus type 2, controlled: CBG stable-continue SSI-metformin on hold.  Recent Labs     06/29/21 2037 06/30/21 0629 06/30/21 0856  GLUCAP 148* 122* 131*    Hypomagnesemia: Repleted.  Hyperlipidemia: On statin  History of CVA: No focal deficits-resumed Plavix since no surgery planned.  CT head this admission unremarkable.  History of Parkinson's disease/parkinsonism: Followed by outpatient neurology-continue Sinemet.  History of narcolepsy/OSA: Continue Adderall and CPAP nightly.  Constipation: Likely due to immobility.  Started on bowel regimen yesterday.  Patient reports that he had a BM yesterday.  Obesity: BMI 32 follow with PCP for weight loss.  Hospital-acquired delirium with possible mild metabolic encephalopathy.  Head CT unremarkable, no vomiting deficits, continue supportive care minimize narcotics and benzodiazepines.  Diet: Diet Order             Diet heart healthy/carb modified Room service appropriate? Yes; Fluid consistency: Thin  Diet effective now                    Code Status: Full code  Family Communication: Aram Cada [phone] called on 06/30/2021 at 11:59 AM, went to answering machine, mailbox is full.  Disposition Plan: Status is: Inpatient  Remains inpatient appropriate because:Inpatient level of care appropriate due to severity of illness  Dispo: The patient is from: Home              Anticipated d/c is to: SNF              Patient currently is medically stable to d/c, likely on 07/01/2021.   Difficult to place patient No   Barriers to Discharge: Awaiting SNF bed-remains stable for discharge.  Social work following.  Antimicrobial agents: Anti-infectives (From admission, onward)    None         MEDICATIONS: Scheduled Meds:  acetaminophen  1,000 mg Oral Q8H   amphetamine-dextroamphetamine  10 mg Oral Q breakfast   atorvastatin  20 mg Oral Daily   calcium-vitamin D  1 tablet Oral BID   carbidopa-levodopa  1 tablet Oral TID   clopidogrel  75 mg Oral Daily   enoxaparin (LOVENOX) injection  50 mg  Subcutaneous Q24H   insulin aspart  0-9 Units Subcutaneous TID WC   lisinopril  5 mg Oral Daily   polyethylene glycol  17 g Oral Daily   senna-docusate  2 tablet Oral BID   Continuous Infusions: PRN Meds:.ondansetron (ZOFRAN) IV, traMADol   PHYSICAL EXAM: Vital signs: Vitals:   06/29/21 0518 06/29/21 0700 06/29/21 1500 06/30/21 0754  BP: 107/90 124/71 125/80 118/71  Pulse: 66 66 68 64  Resp: '19 18 17 16  '$ Temp: 99 F (37.2 C) 98.1 F (36.7 C) 98.3 F (36.8 C) 98 F (36.7 C)  TempSrc: Oral Oral Oral Oral  SpO2: 99% 95% 94% 92%  Weight: 97.8 kg     Height:       Filed Weights   06/27/21 0500 06/28/21 0421 06/29/21 0518  Weight: 97.8 kg 101 kg 97.8 kg   Body mass index is 32.78 kg/m.   Exam :  he is slightly somnolent currently in OR for extremities to painful stimuli, [place].AT,PERRAL Supple Neck,No JVD, No cervical lymphadenopathy appriciated.  Symmetrical Chest wall movement, Good air movement bilaterally, CTAB RRR,No Gallops, Rubs or new Murmurs, No Parasternal Heave +ve B.Sounds, Abd Soft, No tenderness, No organomegaly appriciated, No rebound - guarding or rigidity. No Cyanosis, Clubbing or edema, No new Rash or bruise    I have personally reviewed following labs and imaging studies  LABORATORY DATA: CBC: Recent Labs  Lab 06/23/21 2344 06/24/21 0229 06/25/21 0422 06/30/21 0241  WBC 14.4* 14.0* 11.5* 12.6*  NEUTROABS 10.9* 10.5*  --   --   HGB 13.4 13.7 12.7* 13.6  HCT 41.7 41.4 38.6* 39.7  MCV 93.3 91.8 89.8 87.8  PLT 219 219 212 [id number]    Basic Metabolic Panel: Recent Labs  Lab 06/23/21 2344 06/24/21 0229 06/25/21 0422 06/30/21 0241  NA 138 138 138 136  K 4.3 4.4 4.4 4.1  CL 106 107 103 103  CO2 '23 23 27 23  '$ GLUCOSE 124* 128* 127* 121*  BUN '15 15 19 '$ 28*  CREATININE 0.96 0.98 1.00 0.97  CALCIUM 9.4 9.6 9.5 9.8  MG  --  1.4* 2.0  --     GFR: Estimated Creatinine Clearance: 67.8 mL/min (by C-G formula based on SCr of 0.97 mg/dL).  Liver  Function Tests: Recent Labs  Lab 06/24/21 0229 06/30/21 0241  AST 20 19  ALT 19 <5  ALKPHOS 50 58  BILITOT 1.1 0.9  PROT 6.3* 6.6  ALBUMIN 3.5 3.3*   Coagulation Profile: Recent Labs  Lab 06/24/21 0229  INR 1.1     Urine analysis:    Component Value Date/Time   COLORURINE YELLOW 06/24/2021 0027   APPEARANCEUR CLEAR 06/24/2021 0027   LABSPEC 1.019 06/24/2021 0027   PHURINE 5.0 06/24/2021 0027   GLUCOSEU NEGATIVE 06/24/2021 0027   HGBUR MODERATE (A) 06/24/2021 0027   BILIRUBINUR NEGATIVE 06/24/2021 0027   KETONESUR NEGATIVE 06/24/2021 0027   PROTEINUR NEGATIVE 06/24/2021 0027   NITRITE NEGATIVE 06/24/2021 0027   LEUKOCYTESUR NEGATIVE 06/24/2021 0027     RADIOLOGY STUDIES/RESULTS: CT HEAD WO CONTRAST (5MM)  Result Date: 06/30/2021 CLINICAL DATA:  Mental status changeMental status change, unknown cause EXAM: CT HEAD WITHOUT CONTRAST TECHNIQUE:  Contiguous axial images were obtained from the base of the skull through the vertex without intravenous contrast. COMPARISON:  None. FINDINGS: Brain: No acute intracranial hemorrhage. No focal mass lesion. No CT evidence of acute infarction. No midline shift or mass effect. No hydrocephalus. Basilar cisterns are patent. There are periventricular and subcortical white matter hypodensities. Generalized cortical atrophy. Bilateral small lacunar infarctions within the thalami. Vascular: No hyperdense vessel or unexpected calcification. Skull: Normal. Negative for fracture or focal lesion. Sinuses/Orbits: Mucosal thickening in the LEFT maxillary sinus. Opacification of the frontal sinus. Findings similar to CT 04/14/2020. Other: None. IMPRESSION: 1. No acute intracranial findings. 2. Atrophy and white matter microvascular disease. 3. Bilateral remote thalamic lacunar infarctions. 4. Chronic opacification of the LEFT maxillary sinus frontal sinus. Electronically Signed   By: Suzy Bouchard M.D.   On: 06/30/2021 07:48     LOS: 4 days    Signature  Lala Lund M.D on 06/30/2021 at 11:02 AM   -  To page go to [email or web address]

## 2021-07-01 DIAGNOSIS — G47419 Narcolepsy without cataplexy: Secondary | ICD-10-CM | POA: Diagnosis not present

## 2021-07-01 DIAGNOSIS — G20 Parkinson's disease: Secondary | ICD-10-CM | POA: Diagnosis not present

## 2021-07-01 DIAGNOSIS — R443 Hallucinations, unspecified: Secondary | ICD-10-CM | POA: Diagnosis not present

## 2021-07-01 DIAGNOSIS — S32472D Displaced fracture of medial wall of left acetabulum, subsequent encounter for fracture with routine healing: Secondary | ICD-10-CM | POA: Diagnosis not present

## 2021-07-01 DIAGNOSIS — S32402A Unspecified fracture of left acetabulum, initial encounter for closed fracture: Secondary | ICD-10-CM | POA: Diagnosis not present

## 2021-07-01 DIAGNOSIS — R262 Difficulty in walking, not elsewhere classified: Secondary | ICD-10-CM | POA: Diagnosis not present

## 2021-07-01 DIAGNOSIS — M25552 Pain in left hip: Secondary | ICD-10-CM | POA: Diagnosis not present

## 2021-07-01 DIAGNOSIS — S3289XA Fracture of other parts of pelvis, initial encounter for closed fracture: Secondary | ICD-10-CM | POA: Diagnosis not present

## 2021-07-01 DIAGNOSIS — G4733 Obstructive sleep apnea (adult) (pediatric): Secondary | ICD-10-CM | POA: Diagnosis not present

## 2021-07-01 DIAGNOSIS — Z20818 Contact with and (suspected) exposure to other bacterial communicable diseases: Secondary | ICD-10-CM | POA: Diagnosis not present

## 2021-07-01 DIAGNOSIS — Z9989 Dependence on other enabling machines and devices: Secondary | ICD-10-CM | POA: Diagnosis not present

## 2021-07-01 DIAGNOSIS — Z7401 Bed confinement status: Secondary | ICD-10-CM | POA: Diagnosis not present

## 2021-07-01 DIAGNOSIS — M6281 Muscle weakness (generalized): Secondary | ICD-10-CM | POA: Diagnosis not present

## 2021-07-01 DIAGNOSIS — S32402D Unspecified fracture of left acetabulum, subsequent encounter for fracture with routine healing: Secondary | ICD-10-CM | POA: Diagnosis not present

## 2021-07-01 DIAGNOSIS — E1151 Type 2 diabetes mellitus with diabetic peripheral angiopathy without gangrene: Secondary | ICD-10-CM | POA: Diagnosis not present

## 2021-07-01 DIAGNOSIS — J069 Acute upper respiratory infection, unspecified: Secondary | ICD-10-CM | POA: Diagnosis not present

## 2021-07-01 DIAGNOSIS — I10 Essential (primary) hypertension: Secondary | ICD-10-CM | POA: Diagnosis not present

## 2021-07-01 DIAGNOSIS — S32472S Displaced fracture of medial wall of left acetabulum, sequela: Secondary | ICD-10-CM | POA: Diagnosis not present

## 2021-07-01 DIAGNOSIS — Z8673 Personal history of transient ischemic attack (TIA), and cerebral infarction without residual deficits: Secondary | ICD-10-CM | POA: Diagnosis not present

## 2021-07-01 DIAGNOSIS — R2681 Unsteadiness on feet: Secondary | ICD-10-CM | POA: Diagnosis not present

## 2021-07-01 DIAGNOSIS — R41841 Cognitive communication deficit: Secondary | ICD-10-CM | POA: Diagnosis not present

## 2021-07-01 DIAGNOSIS — F339 Major depressive disorder, recurrent, unspecified: Secondary | ICD-10-CM | POA: Diagnosis not present

## 2021-07-01 DIAGNOSIS — R42 Dizziness and giddiness: Secondary | ICD-10-CM | POA: Diagnosis not present

## 2021-07-01 DIAGNOSIS — E119 Type 2 diabetes mellitus without complications: Secondary | ICD-10-CM | POA: Diagnosis not present

## 2021-07-01 LAB — GLUCOSE, CAPILLARY
Glucose-Capillary: 117 mg/dL — ABNORMAL HIGH (ref 70–99)
Glucose-Capillary: 105 mg/dL — ABNORMAL HIGH (ref 70–99)

## 2021-07-01 LAB — SARS CORONAVIRUS 2 (TAT 6-24 HRS): SARS Coronavirus 2: NEGATIVE

## 2021-07-01 MED ORDER — INSULIN ASPART 100 UNIT/ML FLEXPEN: PEN_INJECTOR | SUBCUTANEOUS | 0 refills | Status: DC

## 2021-07-01 MED ORDER — TRAMADOL HCL 50 MG PO TABS: 50.0000 mg | ORAL_TABLET | Freq: Four times a day (QID) | ORAL | 0 refills | Status: DC | PRN

## 2021-07-01 MED ORDER — CALCIUM CARBONATE-VITAMIN D 500-200 MG-UNIT PO TABS: 1.0000 | ORAL_TABLET | Freq: Two times a day (BID) | ORAL | Status: DC

## 2021-07-01 NOTE — Progress Notes (Signed)
Attempt to call report to facility and no answer at this time; will retry again shortly.

## 2021-07-01 NOTE — Progress Notes (Signed)
Physical Therapy Treatment Patient Details Name: Jordan [name] MRN: [id number] DOB: February 22, 1941 Today's Date: 07/01/2021    History of Present Illness 80 yo male presenting on 06/23/21 after a fall at home, L hip pain into groin, and found to have L comminuted mildly displaced fracture of the left acetabulum extending into the left iliac bone and proximal left superior pubic ramus. Ortho planning non-operative management.  Additonally has leukocytosis, pelvic hematoma, cleared for Covid exposure.  PMH:  LBP, heart disease, parkinsons, gout, HLD, HTN, narcolepsy, CVA, DM, fracture surgery, rotator cuff repair, CAD, CHF, pacemaker, bradycardia, stroke    PT Comments    Pt was seen for there ex on bed and repositioning for comfort on L hip, but then with initial movement to side of bed had to stop for arrival of PTAR to go to SNF.  Pt is distracted and difficult to redirect, but with verbal and tactile cues can be assisted to move LLE safely.  Pt is partially aware of his precautions, and will be further instructed as he moves to the next venue of care for rehab.  Recommend SNF care to be appropriate based on his current functional level and safety with recall of instructions.     Follow Up Recommendations  SNF     Equipment Recommendations  Hospital bed;Wheelchair (measurements PT);Wheelchair cushion (measurements PT)    Recommendations for Other Services       Precautions / Restrictions Precautions Precautions: Fall;Other (comment) Precaution Comments: distracted, focused excessively on L hip Restrictions Weight Bearing Restrictions: Yes LLE Weight Bearing: Touchdown weight bearing    Mobility  Bed Mobility Overal bed mobility: Needs Assistance             General bed mobility comments: pt was about to be assisted to sit up when transport arrived    Transfers                    Ambulation/Gait                 Stairs             Wheelchair  Mobility    Modified Rankin (Stroke Patients Only)       Balance                                            Cognition Arousal/Alertness: Awake/alert Behavior During Therapy: Restless;WFL for tasks assessed/performed Overall Cognitive Status: No family/caregiver present to determine baseline cognitive functioning                                 General Comments: redirected for mult distractions including pt forgetting certain words and his concern with where the staff were      Exercises General Exercises - Lower Extremity Ankle Circles/Pumps: AAROM;5 reps Heel Slides: AAROM;AROM;10 reps Hip ABduction/ADduction: AROM;AAROM;10 reps Straight Leg Raises: AAROM;10 reps    General Comments General comments (skin integrity, edema, etc.): pt is on bed in awkward LLE posture and more comfortable once PT repositioned and assisted him to do ROM      Pertinent Vitals/Pain Pain Assessment: Faces Faces Pain Scale: Hurts little more Pain Location: LLE with pt moving by his choice of direction Pain Descriptors / Indicators: Guarding Pain Intervention(s): Limited activity within patient's tolerance;Monitored during session;Premedicated before session;Repositioned (Pt is  comfortable directed by PT)    Home Living                      Prior Function            PT Goals (current goals can now be found in the care plan section) Acute Rehab PT Goals Patient Stated Goal: To go home Progress towards PT goals: Not progressing toward goals - comment    Frequency    Min 3X/week      PT Plan Current plan remains appropriate    Co-evaluation              AM-PAC PT "6 Clicks" Mobility   Outcome Measure  Help needed turning from your back to your side while in a flat bed without using bedrails?: A Lot Help needed moving from lying on your back to sitting on the side of a flat bed without using bedrails?: A Lot Help needed moving to and  from a bed to a chair (including a wheelchair)?: A Lot Help needed standing up from a chair using your arms (e.g., wheelchair or bedside chair)?: Total Help needed to walk in hospital room?: Total Help needed climbing 3-5 steps with a railing? : Total 6 Click Score: 9    End of Session   Activity Tolerance: Patient tolerated treatment well;Other (comment) (Arrival of PTAR) Patient left: in bed;with call bell/phone within reach;with nursing/sitter in room Nurse Communication: Mobility status;Precautions PT Visit Diagnosis: Muscle weakness (generalized) (M62.81);Pain Pain - Right/Left: Left Pain - part of body: Hip     Time: [id number] PT Time Calculation (min) (ACUTE ONLY): 25 min  Charges:  $Therapeutic Exercise: 8-22 mins $Therapeutic Activity: 8-22 mins          Ramond Dial 07/01/2021, 4:17 PM  Mee Hives, PT MS Acute Rehab Dept. Number: [place] and [place]

## 2021-07-01 NOTE — Discharge Summary (Addendum)
Jordan [name] [id number] DOB: May 02, 1941 DOA: 06/23/2021  PCP: [name], MD  Admit date: 06/23/2021  Discharge date: 07/01/2021  Admitted From: Home  Disposition:  SNF   Recommendations for Outpatient Follow-up:   Follow up with PCP in 1-2 weeks  PCP Please obtain BMP/CBC, 2 view CXR in 1week,  (see Discharge instructions)   PCP Please follow up on the following pending results: Check calcium and vitamin D levels in 1 to 2 weeks   Home Health: None   Equipment/Devices: None  Consultations: Ortho   Discharge Condition: Stable    CODE STATUS: Full    Diet Recommendation: Heart Healthy - Low Carb  Diet Order             Diet heart healthy/carb modified Room service appropriate? Yes; Fluid consistency: Thin  Diet effective now                    Chief Complaint  Patient presents with   Fall     Brief history of present illness from the day of admission and additional interim summary    Patient is a 80 y.o. male with history of HF PEF, Parkinson's disease, bradycardia-s/p PPM placement, DM-2, OSA, narcolepsy-who presented following a mechanical fall-found to have left acetabular fracture.  Evaluated by orthopedics with recommendations to pursue nonoperative management.  Subsequently seen by physical therapy with recommendations for SNF on discharge.  Currently awaiting SNF bed.   Significant events: 8/23>> presented to [facility] ED following a fall-found to have left acetabular fracture.   Significant studies: 8/23>> CT pelvis: Comminuted mildly displaced fracture of left acetabulum-extending into left iliac bone and left proximal superior pubic ramus.  Small pelvic hematoma.                                                                 Hospital Course   Bilateral pelvic  fractures-communicated left acetabular fracture: Secondary to mechanical fall-Ortho recommending nonsurgical management.  Per Ortho-okay to be touchdown weightbearing on the left leg.  Will require SNF.  Continue Os-Cal plus vitamin D supplement.  Vitamin D levels were slightly low upon admission, will have him follow-up with PCP for further monitoring.   Essential hypertension: Continue lisinopril.  Blood pressure is reasonably well controlled.   HFpEF: Euvolemic-no indication for diuretics   Diabetes mellitus type 2, controlled: CBG stable-continue SSI-metformin combination to continue.  Check CBGs QA CHS at SNF.   Hypomagnesemia: Repleted.   Hyperlipidemia: On statin   History of CVA: No focal deficits-continue Plavix and statin for secondary prevention.  CT head this admission unremarkable.   History of Parkinson's disease/parkinsonism: Followed by outpatient neurology-continue Sinemet.   History of narcolepsy/OSA: Continue Adderall and CPAP nightly.   Constipation: Likely due to immobility.  Started on bowel regimen yesterday.  Patient reports that he had a BM yesterday.   Obesity: BMI 32 follow with PCP for weight loss.   Hospital-acquired delirium with possible mild metabolic encephalopathy.  Head CT unremarkable, no focal deficits, now close to baseline.   Discharge diagnosis     Principal Problem:   Acetabular fracture ([place]) Active Problems:   HLD (hyperlipidemia)   OSA on CPAP   Essential hypertension   DM2 (diabetes mellitus, type 2) (HCC)   Acute hip pain, left   Fall at home, initial encounter   Leukocytosis   Chronic diastolic heart failure ([place])   Acetabulum fracture, left [facility])    Discharge instructions    Discharge Instructions     Discharge instructions   Complete by: As directed    Follow with Primary MD [name], MD or SNF MD in 7 days   Get CBC, CMP, 2 view Chest X ray -  checked next visit within 1 week by Primary MD or SNF MD     Activity: Left leg toe-touch weightbearing only with full fall precautions use walker/cane & assistance as needed  Disposition SNF  Diet: Heart Healthy - Low Carb, check CBGs QAC-HS  Special Instructions: If you have smoked or chewed Tobacco  in the last 2 yrs please stop smoking, stop any regular Alcohol  and or any Recreational drug use.  On your next visit with your primary care physician please Get Medicines reviewed and adjusted.  Please request your Prim.MD to go over all Hospital Tests and Procedure/Radiological results at the follow up, please get all Hospital records sent to your Prim MD by signing hospital release before you go home.  If you experience worsening of your admission symptoms, develop shortness of breath, life threatening emergency, suicidal or homicidal thoughts you must seek medical attention immediately by calling 911 or calling your MD immediately  if symptoms less severe.  You Must read complete instructions/literature along with all the possible adverse reactions/side effects for all the Medicines you take and that have been prescribed to you. Take any new Medicines after you have completely understood and accpet all the possible adverse reactions/side effects.       Discharge Medications   Allergies as of 07/01/2021   No Known Allergies      Medication List     STOP taking these medications    ibuprofen 200 MG tablet Commonly known as: ADVIL       TAKE these medications    acetaminophen 500 MG tablet Commonly known as: TYLENOL Take 1,000 mg by mouth at bedtime.   amphetamine-dextroamphetamine 10 MG tablet Commonly known as: ADDERALL Take 10 mg by mouth daily with breakfast.   atorvastatin 40 MG tablet Commonly known as: LIPITOR Take 20 mg by mouth daily.   calcium-vitamin D 500-200 MG-UNIT tablet Commonly known as: OSCAL WITH D Take 1 tablet by mouth 2 (two) times daily.   carbidopa-levodopa 25-100 MG tablet Commonly known as:  Sinemet Take 1 tablet by mouth in the morning and at bedtime. What changed: when to take this   clopidogrel 75 MG tablet Commonly known as: PLAVIX Take 1 tablet (75 mg total) by mouth daily.   CO Q-10 PO Take 1 capsule by mouth daily.   insulin aspart 100 UNIT/ML FlexPen Commonly known as: NOVOLOG Before each meal 3 times a day, 140-199 - 2 units, 200-250 - 4 units, 251-299 - 6 units,  300-349 - 8 units,  350 or above 10 units. Insulin PEN if approved, provide syringes and  needles if needed.   lisinopril 5 MG tablet Commonly known as: ZESTRIL Take 5 mg by mouth daily.   metFORMIN 500 MG 24 hr tablet Commonly known as: GLUCOPHAGE-XR Take 4 tablets (2,000 mg total) by mouth daily with breakfast.   multivitamin with minerals Tabs tablet Take 1 tablet by mouth daily.   Nitrostat 0.4 MG SL tablet Generic drug: nitroGLYCERIN PLACE 1 TABLET (0.4 MG TOTAL) UNDER THE TONGUE EVERY 5 (FIVE) MINUTES AS NEEDED FOR CHEST PAIN. What changed: how much to take   OMEGA-3 PO Take 1 capsule by mouth 3 (three) times daily.   PRESCRIPTION MEDICATION See admin instructions. CPAP- At bedtime   sertraline 50 MG tablet Commonly known as: ZOLOFT Take 50 mg by mouth daily.   traMADol 50 MG tablet Commonly known as: ULTRAM Take 1 tablet (50 mg total) by mouth every 6 (six) hours as needed for moderate pain or severe pain.         Contact information for follow-up providers     [name], MD. Schedule an appointment as soon as possible for a visit in 1 week(s).   Specialty: Family Medicine Contact information: [place] [address] [phone]              Contact information for after-discharge care     Destination     [facility] Preferred SNF .   Service: Skilled Nursing Contact information: [id number] [place] [phone]                     Major procedures and  Radiology Reports - PLEASE review detailed and final reports thoroughly  -       CT HEAD WO CONTRAST (5MM)  Result Date: 06/30/2021 CLINICAL DATA:  Mental status changeMental status change, unknown cause EXAM: CT HEAD WITHOUT CONTRAST TECHNIQUE: Contiguous axial images were obtained from the base of the skull through the vertex without intravenous contrast. COMPARISON:  None. FINDINGS: Brain: No acute intracranial hemorrhage. No focal mass lesion. No CT evidence of acute infarction. No midline shift or mass effect. No hydrocephalus. Basilar cisterns are patent. There are periventricular and subcortical white matter hypodensities. Generalized cortical atrophy. Bilateral small lacunar infarctions within the thalami. Vascular: No hyperdense vessel or unexpected calcification. Skull: Normal. Negative for fracture or focal lesion. Sinuses/Orbits: Mucosal thickening in the LEFT maxillary sinus. Opacification of the frontal sinus. Findings similar to CT 04/14/2020. Other: None. IMPRESSION: 1. No acute intracranial findings. 2. Atrophy and white matter microvascular disease. 3. Bilateral remote thalamic lacunar infarctions. 4. Chronic opacification of the LEFT maxillary sinus frontal sinus. Electronically Signed   By: Suzy Bouchard M.D.   On: 06/30/2021 07:48   CT PELVIS WO CONTRAST  Result Date: 06/23/2021 CLINICAL DATA:  Pelvic trauma. EXAM: CT PELVIS WITHOUT CONTRAST TECHNIQUE: Multidetector CT imaging of the pelvis was performed following the standard protocol without intravenous contrast. COMPARISON:  Pelvic radiograph dated 06/23/2021. FINDINGS: Urinary Tract: The visualized distal ureters and urinary bladder appear unremarkable. Bowel: Sigmoid diverticulosis. No bowel dilatation within the pelvis. The appendix is unremarkable. Vascular/Lymphatic: Moderate atherosclerotic calcification. No pelvic adenopathy. Reproductive: The prostate and seminal vesicles are grossly unremarkable. No pelvic mass. Other:   None Musculoskeletal: There is a comminuted mildly displaced fracture of the proximal left superior pubic ramus extending into the anterior column and medial wall of the acetabulum. There is a nondisplaced fracture of the posterior left acetabular roofs contiguous with fracture of  the left iliac bone. There is a nondisplaced fracture of the left superior pubic ramus adjacent to the symphysis previous. No femoral fractures. The bones are osteopenic. Degenerative changes of the lower lumbar spine. There is a small pelvic hematoma to the left of the bladder and along the left pelvic sidewall. There is mild mass effect on the bladder. IMPRESSION: 1. Comminuted mildly displaced fracture of the left acetabulum extending into the left iliac bone and proximal left superior pubic ramus. Additional fracture of the distal left superior pubic ramus adjacent to the symphysis previous. No femoral fracture. 2. Small pelvic hematoma to the left of the bladder and along the left pelvic sidewall. 3. Aortic Atherosclerosis (ICD10-I70.0). Electronically Signed   By: Anner Crete M.D.   On: 06/23/2021 22:58   DG Hip Unilat With Pelvis 2-3 Views Left  Result Date: 06/23/2021 CLINICAL DATA:  Left hip pain after fall EXAM: DG HIP (WITH OR WITHOUT PELVIS) 2-3V LEFT COMPARISON:  None. FINDINGS: Frontal view of the pelvis as well as frontal and frogleg lateral views of the left hip are obtained. No acute fracture, subluxation, or dislocation. Mild symmetrical bilateral hip osteoarthritis. Sacroiliac joints are normal. Soft tissues are unremarkable. IMPRESSION: 1. Osteoarthritis.  No acute fracture. Electronically Signed   By: Randa Ngo M.D.   On: 06/23/2021 19:42    Micro Results     Recent Results (from the past 240 hour(s))  SARS CORONAVIRUS 2 (TAT 6-24 HRS) Nasopharyngeal Nasopharyngeal Swab     Status: None   Collection Time: 06/23/21 10:50 PM   Specimen: Nasopharyngeal Swab  Result Value Ref Range Status   SARS  Coronavirus 2 NEGATIVE NEGATIVE Final    Comment: (NOTE) SARS-CoV-2 target nucleic acids are NOT DETECTED.  The SARS-CoV-2 RNA is generally detectable in upper and lower respiratory specimens during the acute phase of infection. Negative results do not preclude SARS-CoV-2 infection, do not rule out co-infections with other pathogens, and should not be used as the sole basis for treatment or other patient management decisions. Negative results must be combined with clinical observations, patient history, and epidemiological information. The expected result is Negative.  Fact Sheet for Patients: SugarRoll.be  Fact Sheet for Healthcare Providers: https://www.woods-mathews.com/  This test is not yet approved or cleared by the [place] FDA and  has been authorized for detection and/or diagnosis of SARS-CoV-2 by FDA under an Emergency Use Authorization (EUA). This EUA will remain  in effect (meaning this test can be used) for the duration of the COVID-19 declaration under Se ction 564(b)(1) of the Act, 21 U.S.C. section 360bbb-3(b)(1), unless the authorization is terminated or revoked sooner.  Performed at [facility], [place] [address] [place], [place] [address]     Today   Subjective    Khairi Caron today has no headache,no chest abdominal pain,no new weakness tingling or numbness, feels much better     Objective   Blood pressure 112/63, pulse 70, temperature 98 F (36.7 C), resp. rate 18, height '5\' 8"'$  (1.727 m), weight 97.8 kg, SpO2 92 %.   Intake/Output Summary (Last 24 hours) at 07/01/2021 1412 Last data filed at 07/01/2021 0900 Gross per 24 hour  Intake 480 ml  Output 1100 ml  Net -620 ml    Exam  Awake Alert, No new F.N deficits,   [place].AT,PERRAL Supple Neck,No JVD, No cervical lymphadenopathy appriciated.  Symmetrical Chest wall movement, Good air movement bilaterally, CTAB RRR,No Gallops,Rubs or new Murmurs,  No Parasternal Heave +ve B.Sounds, Abd Soft,  Non tender, No organomegaly appriciated, No rebound -guarding or rigidity. No Cyanosis, Clubbing or edema, No new Rash or bruise   Data Review   CBC w Diff:  Lab Results  Component Value Date   WBC 12.6 (H) 06/30/2021   HGB 13.6 06/30/2021   HCT 39.7 06/30/2021   PLT 260 06/30/2021   LYMPHOPCT 16 06/24/2021   MONOPCT 7 06/24/2021   EOSPCT 1 06/24/2021   BASOPCT 0 06/24/2021    CMP:  Lab Results  Component Value Date   NA 136 06/30/2021   K 4.1 06/30/2021   CL 103 06/30/2021   CO2 23 06/30/2021   BUN 28 (H) 06/30/2021   CREATININE 0.97 06/30/2021   CREATININE 1.16 03/18/2016   PROT 6.6 06/30/2021   ALBUMIN 3.3 (L) 06/30/2021   BILITOT 0.9 06/30/2021   ALKPHOS 58 06/30/2021   AST 19 06/30/2021   ALT <5 06/30/2021   Lab Results  Component Value Date   HGBA1C 7.9 (H) 04/14/2020      Total Time in preparing paper work, data evaluation and todays exam - 27 minutes  Lala Lund M.D on 07/01/2021 at 2:12 PM  [facility]

## 2021-07-01 NOTE — Progress Notes (Signed)
PT Cancellation Note  Patient Details Name: Jordan [name] MRN: [id number] DOB: [birth year]   Cancelled Treatment:    Reason Eval/Treat Not Completed: Other (comment).  Pt is on bedpan x 2 when PT attempted to see him.  Will retry as pt allows.   Ramond Dial 07/01/2021, 11:39 AM  Mee Hives, PT MS Acute Rehab Dept. Number: [place] and [place]

## 2021-07-01 NOTE — Discharge Instructions (Signed)
Follow with Primary MD [name], MD or SNF MD in 7 days   Get CBC, CMP, 2 view Chest X ray -  checked next visit within 1 week by Primary MD or SNF MD    Activity: Left leg toe-touch weightbearing only with full fall precautions use walker/cane & assistance as needed  Disposition SNF  Diet: Heart Healthy - Low Carb, check CBGs QAC-HS  Special Instructions: If you have smoked or chewed Tobacco  in the last 2 yrs please stop smoking, stop any regular Alcohol  and or any Recreational drug use.  On your next visit with your primary care physician please Get Medicines reviewed and adjusted.  Please request your Prim.MD to go over all Hospital Tests and Procedure/Radiological results at the follow up, please get all Hospital records sent to your Prim MD by signing hospital release before you go home.  If you experience worsening of your admission symptoms, develop shortness of breath, life threatening emergency, suicidal or homicidal thoughts you must seek medical attention immediately by calling 911 or calling your MD immediately  if symptoms less severe.  You Must read complete instructions/literature along with all the possible adverse reactions/side effects for all the Medicines you take and that have been prescribed to you. Take any new Medicines after you have completely understood and accpet all the possible adverse reactions/side effects.

## 2021-07-01 NOTE — Progress Notes (Signed)
PTAR at bedside to take patient to facility. All paperwork placed in d/c packet. PIV removed. All personal belongings sent with PTAR.

## 2021-07-01 NOTE — Progress Notes (Signed)
Report called to facility and given to Boyd, LPN.

## 2021-07-01 NOTE — Care Management Important Message (Signed)
Important Message  Patient Details  Name: Jordan [name] MRN: [id number] Date of Birth: 1940/12/24   Medicare Important Message Given:  Yes     Orbie Pyo 07/01/2021, 8:17 AM

## 2021-07-01 NOTE — TOC Transition Note (Signed)
Transition of Care [facility]) - CM/SW Discharge Note   Patient Details  Name: Jordan [name] MRN: [id number] Date of Birth: 1941/03/17  Transition of Care [facility]) CM/SW Contact:  Milinda Antis, [place] Phone Number: 07/01/2021, 10:28 AM   Clinical Narrative:     Patient will DC to: SNF Anticipated DC date: 07/01/2021 Family notified: Yes  Transport by: Corey Harold   Per MD patient ready for DC to SNF . RN to call report prior to discharge [phone]. RN, patient, patient's family, and facility notified of DC. Discharge Summary and FL2 sent to facility. DC packet on chart. Ambulance transport will be requested for patient when RN reports that patient is medically ready.   CSW will sign off for now as social work intervention is no longer needed. Please consult [place] again if new needs arise.    Final next level of care: [place] Barriers to Discharge: Barriers Resolved   Patient Goals and CMS Choice        Discharge Placement              Patient chooses bed at: [facility] Patient to be transferred to facility by: [place] Name of family member notified: Naftoli, Mikle (Spouse)   [phone] Patient and family notified of of transfer: 07/01/21  Discharge Plan and Services                                     Social Determinants of Health (SDOH) Interventions     Readmission Risk Interventions No flowsheet data found.

## 2021-07-02 ENCOUNTER — Non-Acute Institutional Stay (SKILLED_NURSING_FACILITY): Payer: Medicare HMO | Admitting: Adult Health

## 2021-07-02 ENCOUNTER — Encounter: Payer: Self-pay | Admitting: Adult Health

## 2021-07-02 DIAGNOSIS — Z8673 Personal history of transient ischemic attack (TIA), and cerebral infarction without residual deficits: Secondary | ICD-10-CM

## 2021-07-02 DIAGNOSIS — G47419 Narcolepsy without cataplexy: Secondary | ICD-10-CM

## 2021-07-02 DIAGNOSIS — S32402D Unspecified fracture of left acetabulum, subsequent encounter for fracture with routine healing: Secondary | ICD-10-CM

## 2021-07-02 DIAGNOSIS — I10 Essential (primary) hypertension: Secondary | ICD-10-CM

## 2021-07-02 DIAGNOSIS — E1151 Type 2 diabetes mellitus with diabetic peripheral angiopathy without gangrene: Secondary | ICD-10-CM

## 2021-07-02 LAB — GLUCOSE, CAPILLARY: Glucose-Capillary: 123 mg/dL — ABNORMAL HIGH (ref 70–99)

## 2021-07-02 NOTE — Progress Notes (Signed)
Location:  [facility] Room Number: 212-A Place of Service:  SNF (31) Provider:  Durenda Age, DNP, FNP-BC  Patient Care Team: [name], MD as PCP - General (Family Medicine) [name], MD (Family Medicine)  Extended Emergency Contact Information Primary Emergency Contact: Wooldridge,Kelly Address: [place] [address] [name] Phone: [phone] Mobile Phone: [phone] Relation: Spouse  Code Status: Full code   Goals of care: Advanced Directive information Advanced Directives 07/02/2021  Does Patient Have a Medical Advance Directive? No  Type of Advance Directive -  Does patient want to make changes to medical advance directive? No - Patient declined  Copy of [place] in Chart? -  Would patient like information on creating a medical advance directive? -     Chief Complaint  Patient presents with   Hospitalization Follow-up    HPI:  Pt is a 80 y.o. male who was admitted to [facility] on 07/01/21 post hospital admission from 06/23/2021 to 07/01/2021.  He has a PMH of HF PEF, diabetes mellitus type 2, OSA and narcolepsy.  He presented to the hospital following a mechanical fall sustaining left acetabular fracture.  He was evaluated by orthopedics who recommended none operative management.  He was seen in the room today.  He stated that he tripped getting out of the car while on the phone but was able to get up on his own.   Past Medical History:  Diagnosis Date   Arthritis    R shoulder, bone spur   Arthritis    "hands" (03/25/2016)   Chronic diastolic heart failure (HCC)    Chronic lower back pain    Coronary heart disease    Dr Pernell Dupre   H/O cardiovascular stress test    perhaps last one was 2009   Heart murmur    12/29/11 echo: mild MR, no AS, trivial TR   History of gout    Hyperlipidemia    Hypertension    saw Dr. Linard Millers  last earl;y- 2014, cardiac cath. last done ?2009, blocks seen didn't require any intervention at that point.   Narcolepsy    [name] 04-11-97; Mean Latency 1.58mn, SOREM 2   OSA on CPAP    NPSG 12-13-98 AHI 22.7   PONV (postoperative nausea and vomiting)    Presence of permanent cardiac pacemaker    Shortness of breath    with exertion    Stroke ([facility]    Type II diabetes mellitus ([facility]    Past Surgical History:  Procedure Laterality Date   CARDIAC CATHETERIZATION  2009?   EP IMPLANTABLE DEVICE N/A 03/25/2016   Procedure: Pacemaker Implant - Dual Chamber;  Surgeon: GEvans Lance MD;  [facility];  Service: Cardiovascular;  Laterality: N/A;   FRACTURE SURGERY     INSERT / REPLACE / REMOVE PACEMAKER  03/24/2016   LEFT AND RIGHT HEART CATHETERIZATION WITH CORONARY ANGIOGRAM N/A 06/05/2014   Procedure: LEFT AND RIGHT HEART CATHETERIZATION WITH CORONARY ANGIOGRAM;  Surgeon: HSinclair Grooms MD;  [facility];  Service: Cardiovascular;  Laterality: N/A;   NASAL FRACTURE SURGERY     SHOULDER ARTHROSCOPY WITH ROTATOR CUFF REPAIR AND SUBACROMIAL DECOMPRESSION Right 08/16/2013   Procedure: SHOULDER ARTHROSCOPY WITH ROTATOR CUFF REPAIR AND SUBACROMIAL DECOMPRESSION;  Surgeon: JNita Sells MD;  [facility]  Service: Orthopedics;  Laterality: Right;  Right shoulder arthroscopy  rotator cuff repair, subacromial decompression.   TONSILLECTOMY      No Known Allergies  Outpatient Encounter Medications as of 07/02/2021  Medication Sig   acetaminophen (TYLENOL) 500 MG tablet Take 1,000 mg by mouth at bedtime.   amphetamine-dextroamphetamine (ADDERALL) 10 MG tablet Take 10 mg by mouth daily with breakfast.   atorvastatin (LIPITOR) 40 MG tablet Take 20 mg by mouth daily.   bisacodyl (DULCOLAX) 10 MG suppository Place rectally as needed. If not relieved by MOM, give 10 mg Bisacodyl suppositiory rectally X 1 dose in 24 hours as needed   calcium-vitamin D (OSCAL WITH D)  500-200 MG-UNIT tablet Take 1 tablet by mouth 2 (two) times daily.   carbidopa-levodopa (SINEMET) 25-100 MG tablet Take 1 tablet by mouth in the morning and at bedtime. (Patient taking differently: Take 1 tablet by mouth 3 (three) times daily.)   clopidogrel (PLAVIX) 75 MG tablet Take 1 tablet (75 mg total) by mouth daily.   Coenzyme Q10 (CO Q-10 PO) Take 1 capsule by mouth daily.   insulin aspart (NOVOLOG) 100 UNIT/ML FlexPen Before each meal 3 times a day, 140-199 - 2 units, 200-250 - 4 units, 251-299 - 6 units,  300-349 - 8 units,  350 or above 10 units. Insulin PEN if approved, provide syringes and needles if needed.   lisinopril (ZESTRIL) 5 MG tablet Take 5 mg by mouth daily.   magnesium hydroxide (MILK OF MAGNESIA) 400 MG/5ML suspension If no BM in 3 days, give 30 cc Milk of Magnesium p.o. x 1 dose in 24 hours as needed (Do not use standing constipation orders for residents with renal failure CFR less than 30. Contact MD for orders)   metFORMIN (GLUCOPHAGE-XR) 500 MG 24 hr tablet Take 4 tablets (2,000 mg total) by mouth daily with breakfast.   Multiple Vitamin (MULTIVITAMIN WITH MINERALS) TABS tablet Take 1 tablet by mouth daily.   NITROSTAT 0.4 MG SL tablet PLACE 1 TABLET (0.4 MG TOTAL) UNDER THE TONGUE EVERY 5 (FIVE) MINUTES AS NEEDED FOR CHEST PAIN. (Patient taking differently: Place 0.4 mg under the tongue every 5 (five) minutes as needed for chest pain.)   Omega-3 Fatty Acids (OMEGA-3 PO) Take 1 capsule by mouth 3 (three) times daily.    PRESCRIPTION MEDICATION See admin instructions. CPAP- At bedtime   sertraline (ZOLOFT) 50 MG tablet Take 50 mg by mouth daily.   Sodium Phosphates (RA SALINE ENEMA RE) Place rectally. If not relieved by Biscodyl suppository, give disposable Saline Enema rectally X 1 dose/24 hrs as needed   traMADol (ULTRAM) 50 MG tablet Take 1 tablet (50 mg total) by mouth every 6 (six) hours as needed for moderate pain or severe pain.   No facility-administered encounter  medications on file as of 07/02/2021.    Review of Systems  GENERAL: No change in appetite, no fatigue, no weight changes, no fever, chills or weakness MOUTH and THROAT: Denies oral discomfort, gingival pain or bleeding RESPIRATORY: no cough, SOB, DOE, wheezing, hemoptysis CARDIAC: No chest pain, edema or palpitations GI: No abdominal pain, diarrhea, constipation, heart burn, nausea or vomiting GU: Denies dysuria, frequency, hematuria, incontinence, or discharge NEUROLOGICAL: Denies dizziness, syncope, numbness, or headache PSYCHIATRIC: Denies feelings of depression or anxiety. No report of hallucinations, insomnia, paranoia, or agitation  Immunization History  Administered Date(s) Administered   Influenza Split 10/02/2011, 08/01/2012, 08/01/2013, 08/02/2015   Influenza Whole 01/30/2009   Influenza, High Dose Seasonal PF 08/29/2015, 08/19/2017, 08/08/2020   Influenza,inj,Quad PF,6+ Mos 10/01/2014   Influenza-Unspecified 07/13/2018  Moderna Sars-Covid-2 Vaccination 11/14/2019, 12/12/2019   Pneumococcal Polysaccharide-23 11/02/2007   Tdap 06/21/2020   Pertinent  Health Maintenance Due  Topic Date Due   FOOT EXAM  Never done   OPHTHALMOLOGY EXAM  Never done   PNA vac Low Risk Adult (2 of 2 - PCV13) 11/01/2008   HEMOGLOBIN A1C  10/14/2020   INFLUENZA VACCINE  06/01/2021   Fall Risk  05/09/2020  Falls in the past year? 0     Vitals:   07/02/21 1639  BP: (!) 143/61  Pulse: 76  Resp: 18  Temp: (!) 96.7 F (35.9 C)  Height: '5\' 8"'$  (1.727 m)   Body mass index is 32.78 kg/m.  Physical Exam  GENERAL APPEARANCE: Well nourished. In no acute distress. Obese. MOUTH and THROAT: Lips are without lesions. Oral mucosa is moist and without lesions.  RESPIRATORY: Breathing is even & unlabored, BS CTAB CARDIAC: RRR, no murmur,no extra heart sounds, no edema GI: Abdomen soft, normal BS, no masses, no tenderness NEUROLOGICAL: There is no tremor. Speech is clear. Alert and oriented X  3 PSYCHIATRIC: . Affect and behavior are appropriate  Labs reviewed: Recent Labs    06/24/21 0229 06/25/21 0422 06/30/21 0241  NA 138 138 136  K 4.4 4.4 4.1  CL 107 103 103  CO2 '23 27 23  '$ GLUCOSE 128* 127* 121*  BUN 15 19 28*  CREATININE 0.98 1.00 0.97  CALCIUM 9.6 9.5 9.8  MG 1.4* 2.0  --    Recent Labs    06/24/21 0229 06/30/21 0241  AST 20 19  ALT 19 <5  ALKPHOS 50 58  BILITOT 1.1 0.9  PROT 6.3* 6.6  ALBUMIN 3.5 3.3*   Recent Labs    06/23/21 2344 06/24/21 0229 06/25/21 0422 06/30/21 0241  WBC 14.4* 14.0* 11.5* 12.6*  NEUTROABS 10.9* 10.5*  --   --   HGB 13.4 13.7 12.7* 13.6  HCT 41.7 41.4 38.6* 39.7  MCV 93.3 91.8 89.8 87.8  PLT 219 219 212 260   Lab Results  Component Value Date   TSH 0.976 04/14/2020   Lab Results  Component Value Date   HGBA1C 7.9 (H) 04/14/2020   Lab Results  Component Value Date   CHOL 115 04/15/2020   HDL 32 (L) 04/15/2020   LDLCALC 57 04/15/2020   TRIG 130 04/15/2020   CHOLHDL 3.6 04/15/2020    Significant Diagnostic Results in last 30 days:  CT HEAD WO CONTRAST (5MM)  Result Date: 06/30/2021 CLINICAL DATA:  Mental status changeMental status change, unknown cause EXAM: CT HEAD WITHOUT CONTRAST TECHNIQUE: Contiguous axial images were obtained from the base of the skull through the vertex without intravenous contrast. COMPARISON:  None. FINDINGS: Brain: No acute intracranial hemorrhage. No focal mass lesion. No CT evidence of acute infarction. No midline shift or mass effect. No hydrocephalus. Basilar cisterns are patent. There are periventricular and subcortical white matter hypodensities. Generalized cortical atrophy. Bilateral small lacunar infarctions within the thalami. Vascular: No hyperdense vessel or unexpected calcification. Skull: Normal. Negative for fracture or focal lesion. Sinuses/Orbits: Mucosal thickening in the LEFT maxillary sinus. Opacification of the frontal sinus. Findings similar to CT 04/14/2020. Other:  None. IMPRESSION: 1. No acute intracranial findings. 2. Atrophy and white matter microvascular disease. 3. Bilateral remote thalamic lacunar infarctions. 4. Chronic opacification of the LEFT maxillary sinus frontal sinus. Electronically Signed   By: Suzy Bouchard M.D.   On: 06/30/2021 07:48   CT PELVIS WO CONTRAST  Result Date: 06/23/2021 CLINICAL DATA:  Pelvic trauma.  EXAM: CT PELVIS WITHOUT CONTRAST TECHNIQUE: Multidetector CT imaging of the pelvis was performed following the standard protocol without intravenous contrast. COMPARISON:  Pelvic radiograph dated 06/23/2021. FINDINGS: Urinary Tract: The visualized distal ureters and urinary bladder appear unremarkable. Bowel: Sigmoid diverticulosis. No bowel dilatation within the pelvis. The appendix is unremarkable. Vascular/Lymphatic: Moderate atherosclerotic calcification. No pelvic adenopathy. Reproductive: The prostate and seminal vesicles are grossly unremarkable. No pelvic mass. Other:  None Musculoskeletal: There is a comminuted mildly displaced fracture of the proximal left superior pubic ramus extending into the anterior column and medial wall of the acetabulum. There is a nondisplaced fracture of the posterior left acetabular roofs contiguous with fracture of the left iliac bone. There is a nondisplaced fracture of the left superior pubic ramus adjacent to the symphysis previous. No femoral fractures. The bones are osteopenic. Degenerative changes of the lower lumbar spine. There is a small pelvic hematoma to the left of the bladder and along the left pelvic sidewall. There is mild mass effect on the bladder. IMPRESSION: 1. Comminuted mildly displaced fracture of the left acetabulum extending into the left iliac bone and proximal left superior pubic ramus. Additional fracture of the distal left superior pubic ramus adjacent to the symphysis previous. No femoral fracture. 2. Small pelvic hematoma to the left of the bladder and along the left pelvic  sidewall. 3. Aortic Atherosclerosis (ICD10-I70.0). Electronically Signed   By: Anner Crete M.D.   On: 06/23/2021 22:58   DG Hip Unilat With Pelvis 2-3 Views Left  Result Date: 06/23/2021 CLINICAL DATA:  Left hip pain after fall EXAM: DG HIP (WITH OR WITHOUT PELVIS) 2-3V LEFT COMPARISON:  None. FINDINGS: Frontal view of the pelvis as well as frontal and frogleg lateral views of the left hip are obtained. No acute fracture, subluxation, or dislocation. Mild symmetrical bilateral hip osteoarthritis. Sacroiliac joints are normal. Soft tissues are unremarkable. IMPRESSION: 1. Osteoarthritis.  No acute fracture. Electronically Signed   By: Randa Ngo M.D.   On: 06/23/2021 19:42    Assessment/Plan  1. Closed nondisplaced fracture of left acetabulum with routine healing, unspecified portion of acetabulum, subsequent encounter -   Orthopedics recommended nonsurgical management -    Touchdown weightbearing on LLE -    For PT and OT, for therapeutic strengthening exercises -   Continue tramadol 50 mg every 6 hours PRN for pain  2. DM (diabetes mellitus), type 2 with peripheral vascular complications (HCC) Lab Results  Component Value Date   HGBA1C 7.9 (H) 04/14/2020   -  CBG 113, continue NovoLog 5 units TID for CBG greater than equal to 250 and metformin 500 mg daily  3. Benign essential HTN -   BP 143/61 -   Continue lisinopril 5 mg daily -    Monitor BPs  4. History of CVA (cerebrovascular accident) -   Stable, continue Plavix 75 mg daily and atorvastatin 40 mg daily  5. Narcolepsy without cataplexy -   Continue Adderall 10 mg daily     Family/ staff Communication:   Discussed plan of care with resident and charge nurse.  Labs/tests ordered: None  Goals of care:   Short-term care   Durenda Age, DNP, MSN, FNP-BC [facility] [phone] (Monday-Friday 8:00 a.m. - 5:00 p.m.) [phone] (after hours)

## 2021-07-03 ENCOUNTER — Encounter: Payer: Self-pay | Admitting: Internal Medicine

## 2021-07-03 ENCOUNTER — Non-Acute Institutional Stay (SKILLED_NURSING_FACILITY): Payer: Medicare HMO | Admitting: Internal Medicine

## 2021-07-03 DIAGNOSIS — R42 Dizziness and giddiness: Secondary | ICD-10-CM

## 2021-07-03 DIAGNOSIS — S32402D Unspecified fracture of left acetabulum, subsequent encounter for fracture with routine healing: Secondary | ICD-10-CM | POA: Diagnosis not present

## 2021-07-03 DIAGNOSIS — E1151 Type 2 diabetes mellitus with diabetic peripheral angiopathy without gangrene: Secondary | ICD-10-CM

## 2021-07-03 NOTE — Assessment & Plan Note (Addendum)
DM with neuro,  renal , & vascular complications Glucose range as IP : 121-128 Last Epic A1c: 7.9% in 04/2020 A1c goal : < 8% Med change based on FBS average

## 2021-07-03 NOTE — Patient Instructions (Addendum)
See assessment and plan under each diagnosis in the problem list and acutely for this visit 

## 2021-07-03 NOTE — Progress Notes (Signed)
NURSING HOME LOCATION:  [facility] ROOM NUMBER:  [id number]  CODE STATUS:  Full  PCP:  [name] MD  This is a comprehensive admission note to this SNFperformed on this date less than 30 days from date of admission. Included are preadmission medical/surgical history; reconciled medication list; family history; social history and comprehensive review of systems.  Corrections and additions to the records were documented. Comprehensive physical exam was also performed. Additionally a clinical summary was entered for each active diagnosis pertinent to this admission in the Problem List to enhance continuity of care.  HPI: He was hospitalized 8/23 - 07/01/2021 , presenting with left acetabular fracture sustained in a mechanical fall.  Imaging revealed bilateral pelvic fractures with a comminuted left acetabular fracture.  Orthopedic consult recommended nonoperative management.  Touchdown weightbearing on the left leg permitted.  Os-Cal vitamin D supplementation continued as Vitamin D level were slightly low at admission.  Past medical and surgical history: Includes Parkinson's disease, chronic diastolic congestive heart failure, chronic low back pain, CAD, history of gout, dyslipidemia, essential hypertension, narcolepsy, OSA on CPAP, history of stroke, diabetes with vascular complications. Surgical procedures include pacemaker placement, cardiac catheterization, and shoulder arthroscopy.  Social history: Rare alcohol intake; former smoker with a 15-pack-year history consumption.  Family history: Noncontributory due to age   Review of systems: He confirms that there was no neurologic or cardiac prodrome prior to the fall.  He basically tripped over a curb exiting his automobile. Hip pain is much improved.  He does describe some intermittent postural symptoms with dizziness.  He also describes slight imbalance and sensation he is going to propel backwards.  These issues have  been present since he had his stroke.  Constitutional: No fever, significant weight change, fatigue  Eyes: No redness, discharge, pain, vision change ENT/mouth: No nasal congestion, purulent discharge, earache, change in hearing, sore throat  Cardiovascular: No chest pain, palpitations, paroxysmal nocturnal dyspnea, claudication, edema  Respiratory: No cough, sputum production, hemoptysis, DOE, significant snoring, apnea Gastrointestinal: No heartburn, dysphagia, abdominal pain, nausea /vomiting, rectal bleeding, melena, change in bowels Genitourinary: No dysuria, hematuria, pyuria, incontinence, nocturia Musculoskeletal: No joint stiffness, joint swelling, weakness, pain Dermatologic: No rash, pruritus, change in appearance of skin Psychiatric: No significant anxiety, depression, insomnia, anorexia Endocrine: No change in hair/skin/nails, excessive thirst, excessive hunger, excessive urination  Hematologic/lymphatic: No significant bruising, lymphadenopathy, abnormal bleeding Allergy/immunology: No itchy/watery eyes, significant sneezing, urticaria, angioedema  Physical exam:  Pertinent or positive findings: Central obesity is present.  He has a mustache and beard.  Ptosis is present on the right.  He is missing multiple maxillary and mandibular teeth.  Dental hygiene is fair-poor.  Breath sounds are decreased and heart sounds are also distant.  Abdomen is markedly protuberant.  Pedal pulses are decreased; dorsalis pedis pulses are stronger than posterior tibial pulses.  There is medial deviation of the right index finger at the DIP joint.  He exhibits a slight myoclonal jerking tremor ,mainly of the right hand.  General appearance: Adequately nourished; no acute distress, increased work of breathing is present.   Lymphatic: No lymphadenopathy about the head, neck, axilla. Eyes: No conjunctival inflammation or lid edema is present. There is no scleral icterus. Ears:  External ear exam shows no  significant lesions or deformities.   Nose:  External nasal examination shows no deformity or inflammation. Nasal mucosa are pink and moist without lesions, exudates Neck:  No thyromegaly, masses, tenderness noted.    Heart:  No  gallop, murmur, click, rub.  Lungs: without wheezes, rhonchi, rales, rubs. Abdomen: Bowel sounds are normal.  Abdomen is soft and nontender with no organomegaly, hernias, masses. GU: Deferred  Extremities:  No cyanosis, clubbing, edema. Neurologic exam:  Strength equal  in upper & lower extremities. Balance, Rhomberg, finger to nose testing could not be completed due to clinical state Deep tendon reflexes are equal Skin: Warm & dry w/o tenting. No significant lesions or rash.  See clinical summary under each active problem in the Problem List with associated updated therapeutic plan

## 2021-07-03 NOTE — Assessment & Plan Note (Signed)
PT/OT at SNF as tolerated. ?

## 2021-07-04 ENCOUNTER — Other Ambulatory Visit: Payer: Self-pay | Admitting: Adult Health

## 2021-07-04 MED ORDER — AMPHETAMINE-DEXTROAMPHETAMINE 10 MG PO TABS: 10.0000 mg | ORAL_TABLET | Freq: Every day | ORAL | 0 refills | Status: DC

## 2021-07-04 MED ORDER — TRAMADOL HCL 50 MG PO TABS: 50.0000 mg | ORAL_TABLET | Freq: Four times a day (QID) | ORAL | 0 refills | Status: DC | PRN

## 2021-07-08 LAB — BASIC METABOLIC PANEL
BUN: 23 — AB (ref 4–21)
CO2: 22 (ref 13–22)
Chloride: 106 (ref 99–108)
Creatinine: 0.8 (ref 0.6–1.3)
Glucose: 100
Potassium: 4.2 (ref 3.4–5.3)
Sodium: 141 (ref 137–147)

## 2021-07-08 LAB — COMPREHENSIVE METABOLIC PANEL
Calcium: 9.5 (ref 8.7–10.7)
GFR calc Af Amer: >90
GFR calc non Af Amer: 81.89

## 2021-07-08 LAB — CBC AND DIFFERENTIAL
HCT: 38 — AB (ref 41–53)
Hemoglobin: 12.9 — AB (ref 13.5–17.5)
Platelets: 332 (ref 150–399)
WBC: 9.9

## 2021-07-08 LAB — CBC: RBC: 4.4 (ref 3.87–5.11)

## 2021-07-15 ENCOUNTER — Telehealth: Payer: Self-pay

## 2021-07-15 NOTE — Telephone Encounter (Signed)
Biotronik alert received for monitor not connected within 21 days. Attempted to contact patient to advise. No answer, LMTCB.

## 2021-07-20 ENCOUNTER — Ambulatory Visit: Payer: Self-pay | Admitting: Neurology

## 2021-07-20 ENCOUNTER — Non-Acute Institutional Stay (SKILLED_NURSING_FACILITY): Payer: Medicare HMO | Admitting: Adult Health

## 2021-07-20 ENCOUNTER — Encounter: Payer: Self-pay | Admitting: Adult Health

## 2021-07-20 DIAGNOSIS — G20 Parkinson's disease: Secondary | ICD-10-CM

## 2021-07-20 DIAGNOSIS — F339 Major depressive disorder, recurrent, unspecified: Secondary | ICD-10-CM | POA: Diagnosis not present

## 2021-07-20 DIAGNOSIS — S32402D Unspecified fracture of left acetabulum, subsequent encounter for fracture with routine healing: Secondary | ICD-10-CM

## 2021-07-20 DIAGNOSIS — R443 Hallucinations, unspecified: Secondary | ICD-10-CM

## 2021-07-20 NOTE — Telephone Encounter (Signed)
No answer no voice mail  

## 2021-07-20 NOTE — Progress Notes (Signed)
Location:  [place] Room Number: [place] of Service:  SNF (31) Provider:  Durenda Age, DNP, FNP-BC  Patient Care Team: [name], MD as PCP - General (Family Medicine) [name], MD (Family Medicine)  Extended Emergency Contact Information Primary Emergency Contact: Corbello,Kelly Address: [address]          [place] [address] [name] of [place] Phone: [phone] Mobile Phone: [phone] Relation: Spouse  Code Status:  FULL CODE  Goals of care: Advanced Directive information Advanced Directives 07/20/2021  Does Patient Have a Medical Advance Directive? No  Type of Advance Directive -  Does patient want to make changes to medical advance directive? -  Copy of [place] in Chart? -  Would patient like information on creating a medical advance directive? -     Chief Complaint  Patient presents with   Acute Visit    Hallucinations    HPI:  Pt is a 80 y.o. male seen today for an acute visit regarding hallucinations. He is currently having PT and OT for short-term rehabilitation. He was [facility] To [facility] on 07/01/21 post hospital admission 06/23/21 to 07/01/21. He has a PMH of HF, Parkinson's disease, bradycardia S/P PPM placement, DM-2, OSA and narcolepsy. He had a mechanical fall and was found to have left acetabular fracture. He was evaluated by orthopedics and recommended non-operative management.  He stated that he sees people in the room. He knows those people are not there for real. He takes Sinemet 25-100 mg 1 tab BID for Parkinsonism. Noted tremors. He said that he was recently started on Zoloft 50 mg daily for depression. Labs does not show infections with wbc 11.0, hgb 12.9, Na 140, K 4.3, BUN 21, creatinine 0.85, Ca 9.2 GFR 87. Urinalysis was negative for blood, nitrite and wbc. There was no reported fever. Wife called and discussed tremors and suspected  Zoloft contributing to the hallucinations.  Past Medical History:  Diagnosis Date   Arthritis    R shoulder, bone spur   Arthritis    "hands" (03/25/2016)   Chronic diastolic heart failure (HCC)    Chronic lower back pain    Coronary heart disease    Dr Pernell Dupre   H/O cardiovascular stress test    perhaps last one was 2009   Heart murmur    12/29/11 echo: mild MR, no AS, trivial TR   History of gout    Hyperlipidemia    Hypertension    saw Dr. Linard Millers last earl;y- 2014, cardiac cath. last done ?2009, blocks seen didn't require any intervention at that point.   Narcolepsy    [name] 04-11-97; Mean Latency 1.81mn, SOREM 2   OSA on CPAP    NPSG 12-13-98 AHI 22.7   PONV (postoperative nausea and vomiting)    Presence of permanent cardiac pacemaker    Shortness of breath    with exertion    [place]    [place] diabetes mellitus [place]    Past Surgical History:  Procedure Laterality Date   CARDIAC CATHETERIZATION  2009?   EP IMPLANTABLE DEVICE N/A 03/25/2016   Procedure: Pacemaker Implant - Dual Chamber;  Surgeon: GEvans Lance MD;  [facility];  Service: Cardiovascular;  Laterality: N/A;   FRACTURE SURGERY     INSERT / REPLACE / REMOVE PACEMAKER  03/24/2016   LEFT AND RIGHT HEART CATHETERIZATION WITH CORONARY ANGIOGRAM N/A 06/05/2014   Procedure: LEFT AND  RIGHT HEART CATHETERIZATION WITH CORONARY ANGIOGRAM;  Surgeon: Sinclair Grooms, MD;  Location: [facility] C[facility] LAB;  Service: Cardiovascular;  Laterality: N/A;   NASAL FRACTURE SURGERY     SHOULDER ARTHROSCOPY WITH ROTATOR CUFF REPAIR AND SUBACROMIAL DECOMPRESSION Right 08/16/2013   Procedure: SHOULDER ARTHROSCOPY WITH ROTATOR CUFF REPAIR AND SUBACROMIAL DECOMPRESSION;  Surgeon: [name], MD;  Location: [place];  Service: Orthopedics;  Laterality: Right;  Right shoulder arthroscopy rotator cuff repair, subacromial decompression.   TONSILLECTOMY      No Known Allergies  Outpatient Encounter Medications  as of 07/20/2021  Medication Sig   acetaminophen (TYLENOL) 500 MG tablet Take 1,000 mg by mouth at bedtime.   amphetamine-dextroamphetamine (ADDERALL) 10 MG tablet Take 1 tablet (10 mg total) by mouth daily with breakfast.   atorvastatin (LIPITOR) 40 MG tablet Take 20 mg by mouth daily.   bisacodyl (DULCOLAX) 10 MG suppository Place rectally as needed. If not relieved by MOM, give 10 mg Bisacodyl suppositiory rectally X 1 dose in 24 hours as needed   calcium-vitamin D (OSCAL WITH D) 500-200 MG-UNIT tablet Take 1 tablet by mouth 2 (two) times daily.   carbidopa-levodopa (SINEMET) 25-100 MG tablet Take 1 tablet by mouth in the morning and at bedtime. (Patient taking differently: Take 1 tablet by mouth 3 (three) times daily.)   clopidogrel (PLAVIX) 75 MG tablet Take 1 tablet (75 mg total) by mouth daily.   Coenzyme Q10 (CO Q-10 PO) Take 1 capsule by mouth daily.   insulin aspart (NOVOLOG) 100 UNIT/ML FlexPen Before each meal 3 times a day, 140-199 - 2 units, 200-250 - 4 units, 251-299 - 6 units,  300-349 - 8 units,  350 or above 10 units. Insulin PEN if approved, provide syringes and needles if needed.   lisinopril (ZESTRIL) 5 MG tablet Take 5 mg by mouth daily.   magnesium hydroxide (MILK OF MAGNESIA) 400 MG/5ML suspension If no BM in 3 days, give 30 cc Milk of Magnesium p.o. x 1 dose in 24 hours as needed (Do not use standing constipation orders for residents with renal failure CFR less than 30. Contact MD for orders)   metFORMIN (GLUCOPHAGE-XR) 500 MG 24 hr tablet Take 4 tablets (2,000 mg total) by mouth daily with breakfast.   Multiple Vitamin (MULTIVITAMIN WITH MINERALS) TABS tablet Take 1 tablet by mouth daily.   NITROSTAT 0.4 MG SL tablet PLACE 1 TABLET (0.4 MG TOTAL) UNDER THE TONGUE EVERY 5 (FIVE) MINUTES AS NEEDED FOR CHEST PAIN. (Patient taking differently: Place 0.4 mg under the tongue every 5 (five) minutes as needed for chest pain.)   Omega-3 Fatty Acids (OMEGA-3 PO) Take 1 capsule by mouth  3 (three) times daily.    PRESCRIPTION MEDICATION See admin instructions. CPAP- At bedtime   sertraline (ZOLOFT) 50 MG tablet Take 50 mg by mouth daily.   Sodium Phosphates (RA SALINE ENEMA RE) Place rectally. If not relieved by Biscodyl suppository, give disposable Saline Enema rectally X 1 dose/24 hrs as needed   traMADol (ULTRAM) 50 MG tablet Take 1 tablet (50 mg total) by mouth every 6 (six) hours as needed for moderate pain or severe pain.   No facility-administered encounter medications on file as of 07/20/2021.    Review of Systems  GENERAL: No change in appetite, no fatigue, no weight changes, no fever, chills or weakness MOUTH and THROAT: Denies oral discomfort, gingival pain or bleeding RESPIRATORY: no cough, SOB, DOE, wheezing, hemoptysis CARDIAC: No chest pain, edema or palpitations GI: No abdominal  pain, diarrhea, constipation, heart burn, nausea or vomiting GU: Denies dysuria, frequency, hematuria, incontinence, or discharge NEUROLOGICAL: Denies dizziness, syncope, numbness, or headache PSYCHIATRIC: Denies feelings of depression or anxiety. No report of hallucinations, insomnia, paranoia, or agitation   Immunization History  Administered Date(s) Administered   Influenza Split 10/02/2011, 08/01/2012, 08/01/2013, 08/02/2015   Influenza Whole 01/30/2009   Influenza, High Dose Seasonal PF 08/29/2015, 08/19/2017, 08/08/2020   Influenza,inj,Quad PF,6+ Mos 10/01/2014   Influenza-Unspecified 07/13/2018   Moderna Sars-Covid-2 Vaccination 11/14/2019, 12/12/2019   Pneumococcal Polysaccharide-23 11/02/2007   Tdap 06/21/2020   Pertinent  Health Maintenance Due  Topic Date Due   FOOT EXAM  Never done   OPHTHALMOLOGY EXAM  Never done   HEMOGLOBIN A1C  10/14/2020   INFLUENZA VACCINE  06/01/2021   Fall Risk  05/09/2020  Falls in the past year? 0     Vitals:   07/20/21 1436  BP: (!) 148/97  Pulse: 79  Temp: 97.7 F (36.5 C)  Height: '5\' 8"'$  (1.727 m)   Body mass index is  32.78 kg/m.  Physical Exam  GENERAL APPEARANCE: Well nourished. In no acute distress. Obese. SKIN:  Skin is warm and dry.  MOUTH and THROAT: Lips are without lesions. Oral mucosa is moist and without lesions.  RESPIRATORY: Breathing is even & unlabored, BS CTAB CARDIAC: RRR, no murmur,no extra heart sounds, no edema GI: Abdomen soft, normal BS, no masses, no tenderness EXTREMITIES:  Able to move X 4 extremities NEUROLOGICAL: + tremor. Speech is clear. Alert and oriented X 3. PSYCHIATRIC:  Affect and behavior are appropriate  Labs reviewed: Recent Labs    06/24/21 0229 06/25/21 0422 06/30/21 0241  NA 138 138 136  K 4.4 4.4 4.1  CL 107 103 103  CO2 '23 27 23  '$ GLUCOSE 128* 127* 121*  BUN 15 19 28*  CREATININE 0.98 1.00 0.97  CALCIUM 9.6 9.5 9.8  MG 1.4* 2.0  --    Recent Labs    06/24/21 0229 06/30/21 0241  AST 20 19  ALT 19 <5  ALKPHOS 50 58  BILITOT 1.1 0.9  PROT 6.3* 6.6  ALBUMIN 3.5 3.3*   Recent Labs    06/23/21 2344 06/24/21 0229 06/25/21 0422 06/30/21 0241  WBC 14.4* 14.0* 11.5* 12.6*  NEUTROABS 10.9* 10.5*  --   --   HGB 13.4 13.7 12.7* 13.6  HCT 41.7 41.4 38.6* 39.7  MCV 93.3 91.8 89.8 87.8  PLT 219 219 212 260   Lab Results  Component Value Date   TSH 0.976 04/14/2020   Lab Results  Component Value Date   HGBA1C 7.9 (H) 04/14/2020   Lab Results  Component Value Date   CHOL 115 04/15/2020   HDL 32 (L) 04/15/2020   LDLCALC 57 04/15/2020   TRIG 130 04/15/2020   CHOLHDL 3.6 04/15/2020    Significant Diagnostic Results in last 30 days:  CT HEAD WO CONTRAST (5MM)  Result Date: 06/30/2021 CLINICAL DATA:  Mental status changeMental status change, unknown cause EXAM: CT HEAD WITHOUT CONTRAST TECHNIQUE: Contiguous axial images were obtained from the base of the skull through the vertex without intravenous contrast. COMPARISON:  None. FINDINGS: Brain: No acute intracranial hemorrhage. No focal mass lesion. No CT evidence of acute infarction. No  midline shift or mass effect. No hydrocephalus. Basilar cisterns are patent. There are periventricular and subcortical white matter hypodensities. Generalized cortical atrophy. Bilateral small lacunar infarctions within the thalami. Vascular: No hyperdense vessel or unexpected calcification. Skull: Normal. Negative for fracture or focal lesion.  Sinuses/Orbits: Mucosal thickening in the LEFT maxillary sinus. Opacification of the frontal sinus. Findings similar to CT 04/14/2020. Other: None. IMPRESSION: 1. No acute intracranial findings. 2. Atrophy and white matter microvascular disease. 3. Bilateral remote thalamic lacunar infarctions. 4. Chronic opacification of the LEFT maxillary sinus frontal sinus. Electronically Signed   By: Suzy Bouchard M.D.   On: 06/30/2021 07:48   CT PELVIS WO CONTRAST  Result Date: 06/23/2021 CLINICAL DATA:  Pelvic trauma. EXAM: CT PELVIS WITHOUT CONTRAST TECHNIQUE: Multidetector CT imaging of the pelvis was performed following the standard protocol without intravenous contrast. COMPARISON:  Pelvic radiograph dated 06/23/2021. FINDINGS: Urinary Tract: The visualized distal ureters and urinary bladder appear unremarkable. Bowel: Sigmoid diverticulosis. No bowel dilatation within the pelvis. The appendix is unremarkable. Vascular/Lymphatic: Moderate atherosclerotic calcification. No pelvic adenopathy. Reproductive: The prostate and seminal vesicles are grossly unremarkable. No pelvic mass. Other:  None Musculoskeletal: There is a comminuted mildly displaced fracture of the proximal left superior pubic ramus extending into the anterior column and medial wall of the acetabulum. There is a nondisplaced fracture of the posterior left acetabular roofs contiguous with fracture of the left iliac bone. There is a nondisplaced fracture of the left superior pubic ramus adjacent to the symphysis previous. No femoral fractures. The bones are osteopenic. Degenerative changes of the lower lumbar  spine. There is a small pelvic hematoma to the left of the bladder and along the left pelvic sidewall. There is mild mass effect on the bladder. IMPRESSION: 1. Comminuted mildly displaced fracture of the left acetabulum extending into the left iliac bone and proximal left superior pubic ramus. Additional fracture of the distal left superior pubic ramus adjacent to the symphysis previous. No femoral fracture. 2. Small pelvic hematoma to the left of the bladder and along the left pelvic sidewall. 3. Aortic Atherosclerosis (ICD10-I70.0). Electronically Signed   By: Anner Crete M.D.   On: 06/23/2021 22:58   DG Hip Unilat With Pelvis 2-3 Views Left  Result Date: 06/23/2021 CLINICAL DATA:  Left hip pain after fall EXAM: DG HIP (WITH OR WITHOUT PELVIS) 2-3V LEFT COMPARISON:  None. FINDINGS: Frontal view of the pelvis as well as frontal and frogleg lateral views of the left hip are obtained. No acute fracture, subluxation, or dislocation. Mild symmetrical bilateral hip osteoarthritis. Sacroiliac joints are normal. Soft tissues are unremarkable. IMPRESSION: 1. Osteoarthritis.  No acute fracture. Electronically Signed   By: Randa Ngo M.D.   On: 06/23/2021 19:42    Assessment/Plan  1. Hallucinations -  probably related to Zoloft, will decrease Zoloft to 25 mg daily  2. 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 Depression, recurrent ([place]) -  mood is stable, continue Zoloft on a lower dose at 25 mg daily      Family/ staff Communication:   Discussed plan of care with resident and charge nurse  Labs/tests ordered:  None  Goals of care:   Short-term care   Durenda Age, DNP, MSN,  FNP-BC [name] [phone] (Monday-Friday 8:00 a.m. - 5:00 p.m.) [phone] (after hours)

## 2021-07-21 DIAGNOSIS — S32402D Unspecified fracture of left acetabulum, subsequent encounter for fracture with routine healing: Secondary | ICD-10-CM | POA: Diagnosis not present

## 2021-07-21 NOTE — Telephone Encounter (Signed)
No answer no voice mail  

## 2021-07-23 LAB — BASIC METABOLIC PANEL
BUN: 14 (ref 4–21)
CO2: 22 (ref 13–22)
Chloride: 109 — AB (ref 99–108)
Creatinine: 0.8 (ref 0.6–1.3)
Glucose: 119
Potassium: 3.6 (ref 3.4–5.3)
Sodium: 145 (ref 137–147)

## 2021-07-23 LAB — CBC AND DIFFERENTIAL
HCT: 39 — AB (ref 41–53)
Hemoglobin: 12.9 — AB (ref 13.5–17.5)
Platelets: 216 (ref 150–399)
WBC: 8.4

## 2021-07-23 LAB — COMPREHENSIVE METABOLIC PANEL
Calcium: 9 (ref 8.7–10.7)
GFR calc Af Amer: >90
GFR calc non Af Amer: 83.53

## 2021-07-23 LAB — CBC: RBC: 4.34 (ref 3.87–5.11)

## 2021-07-23 NOTE — Telephone Encounter (Signed)
I spoke with the patient and he should be home Friday from Rehab. I let him know that I will put a note in his chart.

## 2021-07-23 NOTE — Telephone Encounter (Signed)
The patient left a voicemail stating he has been in the hospital since August and is now in [place]. He has not been home for the monitor to check him.

## 2021-07-24 LAB — CBC AND DIFFERENTIAL
HCT: 39 — AB (ref 41–53)
Hemoglobin: 13.1 — AB (ref 13.5–17.5)
Platelets: 219 (ref 150–399)
WBC: 8.4

## 2021-07-24 LAB — CBC: RBC: 4.41 (ref 3.87–5.11)

## 2021-07-24 LAB — COMPREHENSIVE METABOLIC PANEL: Calcium: 9.1 (ref 8.7–10.7)

## 2021-07-30 ENCOUNTER — Encounter: Payer: Self-pay | Admitting: Adult Health

## 2021-07-30 ENCOUNTER — Non-Acute Institutional Stay (SKILLED_NURSING_FACILITY): Payer: Medicare HMO | Admitting: Adult Health

## 2021-07-30 ENCOUNTER — Encounter: Payer: Self-pay | Admitting: Internal Medicine

## 2021-07-30 DIAGNOSIS — I10 Essential (primary) hypertension: Secondary | ICD-10-CM | POA: Diagnosis not present

## 2021-07-30 DIAGNOSIS — S32402D Unspecified fracture of left acetabulum, subsequent encounter for fracture with routine healing: Secondary | ICD-10-CM | POA: Diagnosis not present

## 2021-07-30 DIAGNOSIS — E1151 Type 2 diabetes mellitus with diabetic peripheral angiopathy without gangrene: Secondary | ICD-10-CM

## 2021-07-30 DIAGNOSIS — Z8673 Personal history of transient ischemic attack (TIA), and cerebral infarction without residual deficits: Secondary | ICD-10-CM

## 2021-07-30 DIAGNOSIS — G20 Parkinson's disease: Secondary | ICD-10-CM | POA: Diagnosis not present

## 2021-07-30 DIAGNOSIS — G47419 Narcolepsy without cataplexy: Secondary | ICD-10-CM | POA: Diagnosis not present

## 2021-07-30 DIAGNOSIS — Z9989 Dependence on other enabling machines and devices: Secondary | ICD-10-CM | POA: Diagnosis not present

## 2021-07-30 DIAGNOSIS — F339 Major depressive disorder, recurrent, unspecified: Secondary | ICD-10-CM

## 2021-07-30 DIAGNOSIS — G4733 Obstructive sleep apnea (adult) (pediatric): Secondary | ICD-10-CM

## 2021-07-30 DIAGNOSIS — R441 Visual hallucinations: Secondary | ICD-10-CM | POA: Insufficient documentation

## 2021-07-30 MED ORDER — CLOPIDOGREL BISULFATE 75 MG PO TABS: 75.0000 mg | ORAL_TABLET | Freq: Every day | ORAL | 0 refills | Status: DC

## 2021-07-30 MED ORDER — TRAMADOL HCL 50 MG PO TABS: 50.0000 mg | ORAL_TABLET | Freq: Four times a day (QID) | ORAL | 0 refills | Status: DC | PRN

## 2021-07-30 MED ORDER — INSULIN ASPART 100 UNIT/ML FLEXPEN: PEN_INJECTOR | SUBCUTANEOUS | 0 refills | Status: DC

## 2021-07-30 MED ORDER — METOPROLOL TARTRATE 25 MG PO TABS: 25.0000 mg | ORAL_TABLET | Freq: Two times a day (BID) | ORAL | 0 refills | Status: DC

## 2021-07-30 MED ORDER — SERTRALINE HCL 50 MG PO TABS: 50.0000 mg | ORAL_TABLET | Freq: Every day | ORAL | 0 refills | Status: DC

## 2021-07-30 MED ORDER — ATORVASTATIN CALCIUM 40 MG PO TABS: 20.0000 mg | ORAL_TABLET | Freq: Every day | ORAL | 0 refills | Status: DC

## 2021-07-30 MED ORDER — AMPHETAMINE-DEXTROAMPHETAMINE 10 MG PO TABS: 10.0000 mg | ORAL_TABLET | Freq: Every day | ORAL | 0 refills | Status: DC

## 2021-07-30 MED ORDER — CARBIDOPA-LEVODOPA 25-100 MG PO TABS: 1.0000 | ORAL_TABLET | Freq: Two times a day (BID) | ORAL | 0 refills | Status: DC

## 2021-07-30 MED ORDER — LISINOPRIL 10 MG PO TABS: 10.0000 mg | ORAL_TABLET | Freq: Every day | ORAL | 0 refills | Status: DC

## 2021-07-30 MED ORDER — SERTRALINE HCL 25 MG PO TABS: 25.0000 mg | ORAL_TABLET | Freq: Every day | ORAL | 0 refills | Status: DC

## 2021-07-30 NOTE — Progress Notes (Signed)
Location:  [name] Room Number: 025-E Place of Service:  SNF (31) Provider:  Durenda Age, DNP, FNP-BC  Patient Care Team: [name], MD as PCP - General (Family Medicine) [name], MD (Family Medicine)  Extended Emergency Contact Information Primary Emergency Contact: Anstine,Kelly Address: [address]          [place] [address] [name] of [place] Phone: [phone] Mobile Phone: [phone] Relation: Spouse  Code Status:  FULL CODE  Goals of care: Advanced Directive information Advanced Directives 07/30/2021  Does Patient Have a Medical Advance Directive? No  Type of Advance Directive -  Does patient want to make changes to medical advance directive? No - Patient declined  Copy of [place] in Chart? -  Would patient like information on creating a medical advance directive? -     Chief Complaint  Patient presents with   Discharge Note    Discharge from SNF    HPI:  Pt is a 80 y.o. male who is for discharge home on 07/31/21 with Home health PT and OT.  He was admitted to [facility] on 07/01/21 post hospital admission 06/23/21 to 07/01/21. He has a PMH of HF PEF, diabetes mellitus type 2, OSA and narcolepsy. He had a fall at home sustaining a left acetabular fracture. He was evaluated by orthopedics who recommended non-operative management.  At [facility], he had episodes of hallucinations. Zoloft dosage was decreased from 50 mg daily to 25 mg daily. Hallucinations is now resolved.BPs were elevated so Metoprolol 25 mg BID was started on 07/29/21. He is, also, on Lisinopril 10 mg daily for hypertension.  Patient was admitted to this facility for short-term rehabilitation after the patient's recent hospitalization.  Patient has completed SNF rehabilitation and therapy has cleared the patient for discharge.   Past Medical History:  Diagnosis Date   Arthritis    R  shoulder, bone spur   Arthritis    "hands" (03/25/2016)   Chronic diastolic heart failure (HCC)    Chronic lower back pain    Coronary heart disease    Dr Pernell Dupre   H/O cardiovascular stress test    perhaps last one was 2009   Heart murmur    12/29/11 echo: mild MR, no AS, trivial TR   History of gout    Hyperlipidemia    Hypertension    saw Dr. Linard Millers last earl;y- 2014, cardiac cath. last done ?2009, blocks seen didn't require any intervention at that point.   Narcolepsy    [name] 04-11-97; Mean Latency 1.90min, SOREM 2   OSA on CPAP    NPSG 12-13-98 AHI 22.7   PONV (postoperative nausea and vomiting)    Presence of permanent cardiac pacemaker    Shortness of breath    with exertion    Stroke ([place])    Type II diabetes mellitus ([place])    Past Surgical History:  Procedure Laterality Date   CARDIAC CATHETERIZATION  2009?   EP IMPLANTABLE DEVICE N/A 03/25/2016   Procedure: Pacemaker Implant - Dual Chamber;  Surgeon: Evans Lance, MD;  Location: [facility];  Service: Cardiovascular;  Laterality: N/A;   FRACTURE SURGERY     INSERT / REPLACE / REMOVE PACEMAKER  03/24/2016   LEFT AND RIGHT HEART CATHETERIZATION WITH CORONARY ANGIOGRAM N/A 06/05/2014   Procedure: LEFT AND RIGHT HEART CATHETERIZATION WITH CORONARY ANGIOGRAM;  Surgeon: Sinclair Grooms, MD;  Location: [facility] CATH LAB;  Service: Cardiovascular;  Laterality: N/A;   NASAL FRACTURE SURGERY     SHOULDER ARTHROSCOPY WITH ROTATOR CUFF REPAIR AND SUBACROMIAL DECOMPRESSION Right 08/16/2013   Procedure: SHOULDER ARTHROSCOPY WITH ROTATOR CUFF REPAIR AND SUBACROMIAL DECOMPRESSION;  Surgeon: [name], MD;  Location: [facility];  Service: Orthopedics;  Laterality: Right;  Right shoulder arthroscopy rotator cuff repair, subacromial decompression.   TONSILLECTOMY      No Known Allergies  Outpatient Encounter Medications as of 07/30/2021  Medication Sig   acetaminophen (TYLENOL) 500 MG tablet Take 1,000 mg by mouth at  bedtime.   bisacodyl (DULCOLAX) 10 MG suppository Place rectally as needed. If not relieved by MOM, give 10 mg Bisacodyl suppositiory rectally X 1 dose in 24 hours as needed   Coenzyme Q10 (CO Q-10 PO) Take 1 capsule by mouth daily.   lisinopril (ZESTRIL) 10 MG tablet Take 10 mg by mouth daily.   magnesium hydroxide (MILK OF MAGNESIA) 400 MG/5ML suspension If no BM in 3 days, give 30 cc Milk of Magnesium p.o. x 1 dose in 24 hours as needed (Do not use standing constipation orders for residents with renal failure CFR less than 30. Contact MD for orders)   Multiple Vitamin (MULTIVITAMIN WITH MINERALS) TABS tablet Take 1 tablet by mouth daily.   NITROSTAT 0.4 MG SL tablet PLACE 1 TABLET (0.4 MG TOTAL) UNDER THE TONGUE EVERY 5 (FIVE) MINUTES AS NEEDED FOR CHEST PAIN.   PRESCRIPTION MEDICATION See admin instructions. CPAP- At bedtime   Sodium Phosphates (RA SALINE ENEMA RE) Place rectally. If not relieved by Biscodyl suppository, give disposable Saline Enema rectally X 1 dose/24 hrs as needed   [DISCONTINUED] amphetamine-dextroamphetamine (ADDERALL) 10 MG tablet Take 1 tablet (10 mg total) by mouth daily with breakfast.   [DISCONTINUED] atorvastatin (LIPITOR) 40 MG tablet Take 20 mg by mouth daily.   [DISCONTINUED] carbidopa-levodopa (SINEMET) 25-100 MG tablet Take 1 tablet by mouth in the morning and at bedtime.   [DISCONTINUED] clopidogrel (PLAVIX) 75 MG tablet Take 1 tablet (75 mg total) by mouth daily.   [DISCONTINUED] insulin aspart (NOVOLOG) 100 UNIT/ML FlexPen Before each meal 3 times a day, 140-199 - 2 units, 200-250 - 4 units, 251-299 - 6 units,  300-349 - 8 units,  350 or above 10 units. Insulin PEN if approved, provide syringes and needles if needed.   [DISCONTINUED] metoprolol tartrate (LOPRESSOR) 25 MG tablet Take 25 mg by mouth 2 (two) times daily.   [DISCONTINUED] sertraline (ZOLOFT) 50 MG tablet Take 50 mg by mouth daily.   [DISCONTINUED] traMADol (ULTRAM) 50 MG tablet Take 1 tablet (50 mg  total) by mouth every 6 (six) hours as needed for moderate pain or severe pain.   amphetamine-dextroamphetamine (ADDERALL) 10 MG tablet Take 1 tablet (10 mg total) by mouth daily with breakfast.   atorvastatin (LIPITOR) 40 MG tablet Take 0.5 tablets (20 mg total) by mouth daily.   carbidopa-levodopa (SINEMET) 25-100 MG tablet Take 1 tablet by mouth in the morning and at bedtime.   clopidogrel (PLAVIX) 75 MG tablet Take 1 tablet (75 mg total) by mouth daily.   insulin aspart (NOVOLOG) 100 UNIT/ML FlexPen Before each meal 3 times a day, 140-199 - 2 units, 200-250 - 4 units, 251-299 - 6 units,  300-349 - 8 units,  350 or above 10 units. Insulin PEN if approved, provide syringes and needles if needed.   metoprolol tartrate (LOPRESSOR) 25 MG tablet Take 1 tablet (25 mg total) by mouth 2 (two) times daily.   sertraline (ZOLOFT) 50 MG tablet  Take 1 tablet (50 mg total) by mouth daily.   traMADol (ULTRAM) 50 MG tablet Take 1 tablet (50 mg total) by mouth every 6 (six) hours as needed for moderate pain or severe pain.   [DISCONTINUED] calcium-vitamin D (OSCAL WITH D) 500-200 MG-UNIT tablet Take 1 tablet by mouth 2 (two) times daily.   [DISCONTINUED] lisinopril (ZESTRIL) 5 MG tablet Take 5 mg by mouth daily.   [DISCONTINUED] metFORMIN (GLUCOPHAGE-XR) 500 MG 24 hr tablet Take 4 tablets (2,000 mg total) by mouth daily with breakfast.   [DISCONTINUED] Omega-3 Fatty Acids (OMEGA-3 PO) Take 1 capsule by mouth 3 (three) times daily.    No facility-administered encounter medications on file as of 07/30/2021.    Review of Systems  GENERAL: No change in appetite, no fatigue, no weight changes, no fever or chills  MOUTH and THROAT: Denies oral discomfort, gingival pain or bleeding RESPIRATORY: no cough, SOB, DOE, wheezing, hemoptysis CARDIAC: No chest pain, edema or palpitations GI: No abdominal pain, diarrhea, constipation, heart burn, nausea or vomiting GU: Denies dysuria, frequency, hematuria, incontinence, or  discharge NEUROLOGICAL: Denies dizziness, syncope, numbness, or headache PSYCHIATRIC: Denies feelings of depression or anxiety. No report of hallucinations, insomnia, paranoia, or agitation   Immunization History  Administered Date(s) Administered   Influenza Split 10/02/2011, 08/01/2012, 08/01/2013, 08/02/2015   Influenza Whole 01/30/2009   Influenza, High Dose Seasonal PF 08/29/2015, 08/19/2017, 08/08/2020   Influenza,inj,Quad PF,6+ Mos 10/01/2014   Influenza-Unspecified 07/13/2018   Moderna Sars-Covid-2 Vaccination 11/14/2019, 12/12/2019   Pneumococcal Polysaccharide-23 11/02/2007   Tdap 06/21/2020   Pertinent  Health Maintenance Due  Topic Date Due   FOOT EXAM  Never done   OPHTHALMOLOGY EXAM  Never done   HEMOGLOBIN A1C  10/14/2020   INFLUENZA VACCINE  06/01/2021   Fall Risk  05/09/2020  Falls in the past year? 0     Vitals:   07/30/21 1055  BP: (!) 165/88  Pulse: 69  Resp: 18  Temp: 97.7 F (36.5 C)  Weight: 213 lb 12.8 oz (97 kg)  Height: 5\' 8"  (1.727 m)   Body mass index is 32.51 kg/m.  Physical Exam  GENERAL APPEARANCE: Well nourished. In no acute distress. Obese. SKIN:  Skin is warm and dry.  MOUTH and THROAT: Lips are without lesions. Oral mucosa is moist and without lesions.  RESPIRATORY: Breathing is even & unlabored, BS CTAB CARDIAC: RRR, no murmur,no extra heart sounds, no edema GI: Abdomen soft, normal BS, no masses, no tenderness NEUROLOGICAL: There is no tremor. Speech is clear PSYCHIATRIC: Alert and oriented X 3. Affect and behavior are appropriate  Labs reviewed: Recent Labs    06/24/21 0229 06/25/21 0422 06/30/21 0241 07/08/21 0000 07/23/21 0000 07/24/21 0000  NA 138 138 136 141 145  --   K 4.4 4.4 4.1 4.2 3.6  --   CL 107 103 103 106 109*  --   CO2 23 27 23 22 22   --   GLUCOSE 128* 127* 121*  --   --   --   BUN 15 19 28* 23* 14  --   CREATININE 0.98 1.00 0.97 0.8 0.8  --   CALCIUM 9.6 9.5 9.8 9.5 9.0 9.1  MG 1.4* 2.0  --   --    --   --    Recent Labs    06/24/21 0229 06/30/21 0241  AST 20 19  ALT 19 <5  ALKPHOS 50 58  BILITOT 1.1 0.9  PROT 6.3* 6.6  ALBUMIN 3.5 3.3*   Recent  Labs    06/23/21 2344 06/24/21 0229 06/25/21 0422 06/30/21 0241 07/08/21 0000 07/23/21 0000 07/24/21 0000  WBC 14.4* 14.0* 11.5* 12.6* 9.9 8.4 8.4  NEUTROABS 10.9* 10.5*  --   --   --   --   --   HGB 13.4 13.7 12.7* 13.6 12.9* 12.9* 13.1*  HCT 41.7 41.4 38.6* 39.7 38* 39* 39*  MCV 93.3 91.8 89.8 87.8  --   --   --   PLT 219 219 212 260 332 216 219   Lab Results  Component Value Date   TSH 0.976 04/14/2020   Lab Results  Component Value Date   HGBA1C 7.9 (H) 04/14/2020   Lab Results  Component Value Date   CHOL 115 04/15/2020   HDL 32 (L) 04/15/2020   LDLCALC 57 04/15/2020   TRIG 130 04/15/2020   CHOLHDL 3.6 04/15/2020    Significant Diagnostic Results in last 30 days:  No results found.  Assessment/Plan  1. Closed nondisplaced fracture of left acetabulum with routine healing, unspecified portion of acetabulum, subsequent encounter -  WBAT with walker -   follow up with orthopedics on 08/18/21 - traMADol (ULTRAM) 50 MG tablet; Take 1 tablet (50 mg total) by mouth every 6 (six) hours as needed for moderate pain or severe pain.  Dispense: 30 tablet; Refill: 0  2. Benign essential HTN - metoprolol tartrate (LOPRESSOR) 25 MG tablet; Take 1 tablet (25 mg total) by mouth 2 (two) times daily.  Dispense: 60 tablet; Refill: 0 - lisinopril (ZESTRIL) 10 MG tablet; Take 1 tablet (10 mg total) by mouth daily.  Dispense: 30 tablet; Refill: 0  3. History of CVA (cerebrovascular accident) - atorvastatin (LIPITOR) 40 MG tablet; Take 0.5 tablets (20 mg total) by mouth daily.  Dispense: 30 tablet; Refill: 0 - clopidogrel (PLAVIX) 75 MG tablet; Take 1 tablet (75 mg total) by mouth daily.  Dispense: 30 tablet; Refill: 0  4. DM (diabetes mellitus), type 2 with peripheral vascular complications (HCC) - insulin aspart (NOVOLOG)  100 UNIT/ML FlexPen; Before each meal 3 times a day, 140-199 - 2 units, 200-250 - 4 units, 251-299 - 6 units,  300-349 - 8 units,  350 or above 10 units. Insulin PEN if approved, provide syringes and needles if needed.  Dispense: 15 mL; Refill: 0  5. Primary parkinsonism (HCC) - carbidopa-levodopa (SINEMET) 25-100 MG tablet; Take 1 tablet by mouth in the morning and at bedtime.  Dispense: 60 tablet; Refill: 0  6. Depression, recurrent ([place]) - sertraline (ZOLOFT) 25 MG tablet; Take 1 tablet (25 mg total) by mouth daily.  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  Dispense: 30 tablet; Refill: 0  8. OSA on CPAP -  continue CPAP at bedtime      I have filled out patient's discharge paperwork and e-prescribed medications.  Patient will have home health PT and OT.  DME provided:  wheelchair  Wheelchair  -  Patient suffers from primary parkinsonism and closed nondisplaced fracture of left acetabulum which impairs his ability to perform daily activities like toileting, feeding, dressing, grooming and bathing in the home. A cane or walker will not resolve issue with performing activities of daily living. A wheelchair will allow patient to safely perform daily activities. Patient can safely propel the wheelchair in the home.   Total discharge time: Greater than 30 minutes Greater that 50% was spent in counseling  and coordination of care.    Discharge time involved coordination of the discharge process with social worker, nursing staff and therapy department. Medical justification for home health services/DME verified.    Durenda Age, DNP, MSN, FNP-BC [facility] [phone] (Monday-Friday 8:00 a.m. - 5:00 p.m.) [phone] (after hours)

## 2021-08-01 DIAGNOSIS — M19011 Primary osteoarthritis, right shoulder: Secondary | ICD-10-CM | POA: Diagnosis not present

## 2021-08-01 DIAGNOSIS — G20 Parkinson's disease: Secondary | ICD-10-CM | POA: Diagnosis not present

## 2021-08-01 DIAGNOSIS — E1151 Type 2 diabetes mellitus with diabetic peripheral angiopathy without gangrene: Secondary | ICD-10-CM | POA: Diagnosis not present

## 2021-08-01 DIAGNOSIS — S32402D Unspecified fracture of left acetabulum, subsequent encounter for fracture with routine healing: Secondary | ICD-10-CM | POA: Diagnosis not present

## 2021-08-01 DIAGNOSIS — M19041 Primary osteoarthritis, right hand: Secondary | ICD-10-CM | POA: Diagnosis not present

## 2021-08-01 DIAGNOSIS — I5032 Chronic diastolic (congestive) heart failure: Secondary | ICD-10-CM | POA: Diagnosis not present

## 2021-08-01 DIAGNOSIS — G4733 Obstructive sleep apnea (adult) (pediatric): Secondary | ICD-10-CM | POA: Diagnosis not present

## 2021-08-01 DIAGNOSIS — I110 Hypertensive heart disease with heart failure: Secondary | ICD-10-CM | POA: Diagnosis not present

## 2021-08-01 DIAGNOSIS — G47419 Narcolepsy without cataplexy: Secondary | ICD-10-CM | POA: Diagnosis not present

## 2021-08-03 ENCOUNTER — Ambulatory Visit: Payer: Medicare HMO | Admitting: Neurology

## 2021-08-03 DIAGNOSIS — E7800 Pure hypercholesterolemia, unspecified: Secondary | ICD-10-CM | POA: Diagnosis not present

## 2021-08-03 DIAGNOSIS — F329 Major depressive disorder, single episode, unspecified: Secondary | ICD-10-CM | POA: Diagnosis not present

## 2021-08-03 DIAGNOSIS — E119 Type 2 diabetes mellitus without complications: Secondary | ICD-10-CM | POA: Diagnosis not present

## 2021-08-03 DIAGNOSIS — G47419 Narcolepsy without cataplexy: Secondary | ICD-10-CM | POA: Diagnosis not present

## 2021-08-03 DIAGNOSIS — G20 Parkinson's disease: Secondary | ICD-10-CM | POA: Diagnosis not present

## 2021-08-03 DIAGNOSIS — I2510 Atherosclerotic heart disease of native coronary artery without angina pectoris: Secondary | ICD-10-CM | POA: Diagnosis not present

## 2021-08-03 DIAGNOSIS — I679 Cerebrovascular disease, unspecified: Secondary | ICD-10-CM | POA: Diagnosis not present

## 2021-08-03 DIAGNOSIS — G4730 Sleep apnea, unspecified: Secondary | ICD-10-CM | POA: Diagnosis not present

## 2021-08-03 DIAGNOSIS — I10 Essential (primary) hypertension: Secondary | ICD-10-CM | POA: Diagnosis not present

## 2021-08-04 DIAGNOSIS — E1151 Type 2 diabetes mellitus with diabetic peripheral angiopathy without gangrene: Secondary | ICD-10-CM | POA: Diagnosis not present

## 2021-08-04 DIAGNOSIS — G20 Parkinson's disease: Secondary | ICD-10-CM | POA: Diagnosis not present

## 2021-08-04 DIAGNOSIS — G47419 Narcolepsy without cataplexy: Secondary | ICD-10-CM | POA: Diagnosis not present

## 2021-08-04 DIAGNOSIS — I110 Hypertensive heart disease with heart failure: Secondary | ICD-10-CM | POA: Diagnosis not present

## 2021-08-04 DIAGNOSIS — G4733 Obstructive sleep apnea (adult) (pediatric): Secondary | ICD-10-CM | POA: Diagnosis not present

## 2021-08-04 DIAGNOSIS — M19041 Primary osteoarthritis, right hand: Secondary | ICD-10-CM | POA: Diagnosis not present

## 2021-08-04 DIAGNOSIS — M19011 Primary osteoarthritis, right shoulder: Secondary | ICD-10-CM | POA: Diagnosis not present

## 2021-08-04 DIAGNOSIS — S32402D Unspecified fracture of left acetabulum, subsequent encounter for fracture with routine healing: Secondary | ICD-10-CM | POA: Diagnosis not present

## 2021-08-04 DIAGNOSIS — I5032 Chronic diastolic (congestive) heart failure: Secondary | ICD-10-CM | POA: Diagnosis not present

## 2021-08-06 DIAGNOSIS — M19041 Primary osteoarthritis, right hand: Secondary | ICD-10-CM | POA: Diagnosis not present

## 2021-08-06 DIAGNOSIS — G4733 Obstructive sleep apnea (adult) (pediatric): Secondary | ICD-10-CM | POA: Diagnosis not present

## 2021-08-06 DIAGNOSIS — M19011 Primary osteoarthritis, right shoulder: Secondary | ICD-10-CM | POA: Diagnosis not present

## 2021-08-06 DIAGNOSIS — E1151 Type 2 diabetes mellitus with diabetic peripheral angiopathy without gangrene: Secondary | ICD-10-CM | POA: Diagnosis not present

## 2021-08-06 DIAGNOSIS — I5032 Chronic diastolic (congestive) heart failure: Secondary | ICD-10-CM | POA: Diagnosis not present

## 2021-08-06 DIAGNOSIS — H40013 Open angle with borderline findings, low risk, bilateral: Secondary | ICD-10-CM | POA: Diagnosis not present

## 2021-08-06 DIAGNOSIS — S32402D Unspecified fracture of left acetabulum, subsequent encounter for fracture with routine healing: Secondary | ICD-10-CM | POA: Diagnosis not present

## 2021-08-06 DIAGNOSIS — G47419 Narcolepsy without cataplexy: Secondary | ICD-10-CM | POA: Diagnosis not present

## 2021-08-06 DIAGNOSIS — I110 Hypertensive heart disease with heart failure: Secondary | ICD-10-CM | POA: Diagnosis not present

## 2021-08-06 DIAGNOSIS — G20 Parkinson's disease: Secondary | ICD-10-CM | POA: Diagnosis not present

## 2021-08-07 DIAGNOSIS — E1151 Type 2 diabetes mellitus with diabetic peripheral angiopathy without gangrene: Secondary | ICD-10-CM | POA: Diagnosis not present

## 2021-08-07 DIAGNOSIS — M19011 Primary osteoarthritis, right shoulder: Secondary | ICD-10-CM | POA: Diagnosis not present

## 2021-08-07 DIAGNOSIS — G47419 Narcolepsy without cataplexy: Secondary | ICD-10-CM | POA: Diagnosis not present

## 2021-08-07 DIAGNOSIS — S32402D Unspecified fracture of left acetabulum, subsequent encounter for fracture with routine healing: Secondary | ICD-10-CM | POA: Diagnosis not present

## 2021-08-07 DIAGNOSIS — M19041 Primary osteoarthritis, right hand: Secondary | ICD-10-CM | POA: Diagnosis not present

## 2021-08-07 DIAGNOSIS — I5032 Chronic diastolic (congestive) heart failure: Secondary | ICD-10-CM | POA: Diagnosis not present

## 2021-08-07 DIAGNOSIS — G20 Parkinson's disease: Secondary | ICD-10-CM | POA: Diagnosis not present

## 2021-08-07 DIAGNOSIS — I110 Hypertensive heart disease with heart failure: Secondary | ICD-10-CM | POA: Diagnosis not present

## 2021-08-07 DIAGNOSIS — G4733 Obstructive sleep apnea (adult) (pediatric): Secondary | ICD-10-CM | POA: Diagnosis not present

## 2021-08-10 DIAGNOSIS — M19011 Primary osteoarthritis, right shoulder: Secondary | ICD-10-CM | POA: Diagnosis not present

## 2021-08-10 DIAGNOSIS — G20 Parkinson's disease: Secondary | ICD-10-CM | POA: Diagnosis not present

## 2021-08-10 DIAGNOSIS — G4733 Obstructive sleep apnea (adult) (pediatric): Secondary | ICD-10-CM | POA: Diagnosis not present

## 2021-08-10 DIAGNOSIS — M19041 Primary osteoarthritis, right hand: Secondary | ICD-10-CM | POA: Diagnosis not present

## 2021-08-10 DIAGNOSIS — I5032 Chronic diastolic (congestive) heart failure: Secondary | ICD-10-CM | POA: Diagnosis not present

## 2021-08-10 DIAGNOSIS — G47419 Narcolepsy without cataplexy: Secondary | ICD-10-CM | POA: Diagnosis not present

## 2021-08-10 DIAGNOSIS — E1151 Type 2 diabetes mellitus with diabetic peripheral angiopathy without gangrene: Secondary | ICD-10-CM | POA: Diagnosis not present

## 2021-08-10 DIAGNOSIS — S32402D Unspecified fracture of left acetabulum, subsequent encounter for fracture with routine healing: Secondary | ICD-10-CM | POA: Diagnosis not present

## 2021-08-10 DIAGNOSIS — I110 Hypertensive heart disease with heart failure: Secondary | ICD-10-CM | POA: Diagnosis not present

## 2021-08-12 DIAGNOSIS — E1151 Type 2 diabetes mellitus with diabetic peripheral angiopathy without gangrene: Secondary | ICD-10-CM | POA: Diagnosis not present

## 2021-08-12 DIAGNOSIS — I110 Hypertensive heart disease with heart failure: Secondary | ICD-10-CM | POA: Diagnosis not present

## 2021-08-12 DIAGNOSIS — M19011 Primary osteoarthritis, right shoulder: Secondary | ICD-10-CM | POA: Diagnosis not present

## 2021-08-12 DIAGNOSIS — G20 Parkinson's disease: Secondary | ICD-10-CM | POA: Diagnosis not present

## 2021-08-12 DIAGNOSIS — G4733 Obstructive sleep apnea (adult) (pediatric): Secondary | ICD-10-CM | POA: Diagnosis not present

## 2021-08-12 DIAGNOSIS — I5032 Chronic diastolic (congestive) heart failure: Secondary | ICD-10-CM | POA: Diagnosis not present

## 2021-08-12 DIAGNOSIS — G47419 Narcolepsy without cataplexy: Secondary | ICD-10-CM | POA: Diagnosis not present

## 2021-08-12 DIAGNOSIS — M19041 Primary osteoarthritis, right hand: Secondary | ICD-10-CM | POA: Diagnosis not present

## 2021-08-12 DIAGNOSIS — S32402D Unspecified fracture of left acetabulum, subsequent encounter for fracture with routine healing: Secondary | ICD-10-CM | POA: Diagnosis not present

## 2021-08-13 DIAGNOSIS — G4733 Obstructive sleep apnea (adult) (pediatric): Secondary | ICD-10-CM | POA: Diagnosis not present

## 2021-08-13 DIAGNOSIS — M19041 Primary osteoarthritis, right hand: Secondary | ICD-10-CM | POA: Diagnosis not present

## 2021-08-13 DIAGNOSIS — I5032 Chronic diastolic (congestive) heart failure: Secondary | ICD-10-CM | POA: Diagnosis not present

## 2021-08-13 DIAGNOSIS — G20 Parkinson's disease: Secondary | ICD-10-CM | POA: Diagnosis not present

## 2021-08-13 DIAGNOSIS — I110 Hypertensive heart disease with heart failure: Secondary | ICD-10-CM | POA: Diagnosis not present

## 2021-08-13 DIAGNOSIS — S32402D Unspecified fracture of left acetabulum, subsequent encounter for fracture with routine healing: Secondary | ICD-10-CM | POA: Diagnosis not present

## 2021-08-13 DIAGNOSIS — M19011 Primary osteoarthritis, right shoulder: Secondary | ICD-10-CM | POA: Diagnosis not present

## 2021-08-13 DIAGNOSIS — E1151 Type 2 diabetes mellitus with diabetic peripheral angiopathy without gangrene: Secondary | ICD-10-CM | POA: Diagnosis not present

## 2021-08-13 DIAGNOSIS — G47419 Narcolepsy without cataplexy: Secondary | ICD-10-CM | POA: Diagnosis not present

## 2021-08-14 DIAGNOSIS — M19011 Primary osteoarthritis, right shoulder: Secondary | ICD-10-CM | POA: Diagnosis not present

## 2021-08-14 DIAGNOSIS — G20 Parkinson's disease: Secondary | ICD-10-CM | POA: Diagnosis not present

## 2021-08-14 DIAGNOSIS — I110 Hypertensive heart disease with heart failure: Secondary | ICD-10-CM | POA: Diagnosis not present

## 2021-08-14 DIAGNOSIS — S32402D Unspecified fracture of left acetabulum, subsequent encounter for fracture with routine healing: Secondary | ICD-10-CM | POA: Diagnosis not present

## 2021-08-14 DIAGNOSIS — E1151 Type 2 diabetes mellitus with diabetic peripheral angiopathy without gangrene: Secondary | ICD-10-CM | POA: Diagnosis not present

## 2021-08-14 DIAGNOSIS — G47419 Narcolepsy without cataplexy: Secondary | ICD-10-CM | POA: Diagnosis not present

## 2021-08-14 DIAGNOSIS — M19041 Primary osteoarthritis, right hand: Secondary | ICD-10-CM | POA: Diagnosis not present

## 2021-08-14 DIAGNOSIS — G4733 Obstructive sleep apnea (adult) (pediatric): Secondary | ICD-10-CM | POA: Diagnosis not present

## 2021-08-14 DIAGNOSIS — I5032 Chronic diastolic (congestive) heart failure: Secondary | ICD-10-CM | POA: Diagnosis not present

## 2021-08-18 DIAGNOSIS — S32402D Unspecified fracture of left acetabulum, subsequent encounter for fracture with routine healing: Secondary | ICD-10-CM | POA: Diagnosis not present

## 2021-08-19 DIAGNOSIS — S32402D Unspecified fracture of left acetabulum, subsequent encounter for fracture with routine healing: Secondary | ICD-10-CM | POA: Diagnosis not present

## 2021-08-19 DIAGNOSIS — E1151 Type 2 diabetes mellitus with diabetic peripheral angiopathy without gangrene: Secondary | ICD-10-CM | POA: Diagnosis not present

## 2021-08-19 DIAGNOSIS — G4733 Obstructive sleep apnea (adult) (pediatric): Secondary | ICD-10-CM | POA: Diagnosis not present

## 2021-08-19 DIAGNOSIS — I5032 Chronic diastolic (congestive) heart failure: Secondary | ICD-10-CM | POA: Diagnosis not present

## 2021-08-19 DIAGNOSIS — G47419 Narcolepsy without cataplexy: Secondary | ICD-10-CM | POA: Diagnosis not present

## 2021-08-19 DIAGNOSIS — M19041 Primary osteoarthritis, right hand: Secondary | ICD-10-CM | POA: Diagnosis not present

## 2021-08-19 DIAGNOSIS — I110 Hypertensive heart disease with heart failure: Secondary | ICD-10-CM | POA: Diagnosis not present

## 2021-08-19 DIAGNOSIS — G20 Parkinson's disease: Secondary | ICD-10-CM | POA: Diagnosis not present

## 2021-08-19 DIAGNOSIS — M19011 Primary osteoarthritis, right shoulder: Secondary | ICD-10-CM | POA: Diagnosis not present

## 2021-08-20 DIAGNOSIS — G20 Parkinson's disease: Secondary | ICD-10-CM | POA: Diagnosis not present

## 2021-08-20 DIAGNOSIS — M19041 Primary osteoarthritis, right hand: Secondary | ICD-10-CM | POA: Diagnosis not present

## 2021-08-20 DIAGNOSIS — I110 Hypertensive heart disease with heart failure: Secondary | ICD-10-CM | POA: Diagnosis not present

## 2021-08-20 DIAGNOSIS — G4733 Obstructive sleep apnea (adult) (pediatric): Secondary | ICD-10-CM | POA: Diagnosis not present

## 2021-08-20 DIAGNOSIS — S32402D Unspecified fracture of left acetabulum, subsequent encounter for fracture with routine healing: Secondary | ICD-10-CM | POA: Diagnosis not present

## 2021-08-20 DIAGNOSIS — M19011 Primary osteoarthritis, right shoulder: Secondary | ICD-10-CM | POA: Diagnosis not present

## 2021-08-20 DIAGNOSIS — G47419 Narcolepsy without cataplexy: Secondary | ICD-10-CM | POA: Diagnosis not present

## 2021-08-20 DIAGNOSIS — E1151 Type 2 diabetes mellitus with diabetic peripheral angiopathy without gangrene: Secondary | ICD-10-CM | POA: Diagnosis not present

## 2021-08-20 DIAGNOSIS — I5032 Chronic diastolic (congestive) heart failure: Secondary | ICD-10-CM | POA: Diagnosis not present

## 2021-08-22 ENCOUNTER — Other Ambulatory Visit: Payer: Self-pay | Admitting: Adult Health

## 2021-08-22 DIAGNOSIS — F339 Major depressive disorder, recurrent, unspecified: Secondary | ICD-10-CM

## 2021-08-22 DIAGNOSIS — Z8673 Personal history of transient ischemic attack (TIA), and cerebral infarction without residual deficits: Secondary | ICD-10-CM

## 2021-08-22 DIAGNOSIS — I10 Essential (primary) hypertension: Secondary | ICD-10-CM

## 2021-08-24 NOTE — Progress Notes (Signed)
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 VN Vet NPSG 12/13/98- AHI 22.7/hr Office Spirometry 01/31/14-  Within normal limits-FVC 3.09/77%, FEV1 2.45/80% , FEV1/FVC 0.79, FEF 25-75% 2.45/87%.  ------------------------------------------------------------------------   12/25/20-  80 year old male VN Vet, former smoker followed for OSA/Narcolepsy without cataplexy, complicated by CAD/ pacemaker, HBP, + CVA.   CPAP 9/ [employer] Adderall 10 mg,  Download-  Not available Body weight today-226 lbs                           Son with high-functioning autism Covid vax-2 Moderna Flu vax-had -----Still compliant nightly with CPAP Occasionally mouth too dry from CPAP. Discussed humidifier.  Had stroke last year>R hand.  Also has fallen.   08/25/21- 80 year old male VN Vet, former smoker( 15 pk yrs)  followed for OSA/Narcolepsy without cataplexy, RBD, complicated by CAD/ pacemaker,  dCHF, HTN, + CVA, DM2,  CPAP 9/ [employer]  nasal pillows    S9 [employer] Adderall 10 mg, Sinemet,  Download-compliance 68%, AHI 9.4/ hr. High leak.  Body weight today- Covid vax-4 Moderna Flu vax-wants today Nursing home / SNF after fall/hip fracture had hallucinations- resolved. Here with wife. He iss quite alert and appropriate, hopes to be walking without walker soon. CPAP use back to routine after SNF. We discussed choice between increasing pressure with mask change hoping to get better control without increasing leak. Have opted to make no change this time. He and wife feel Adderall  10 mg once daily helps with alertness and sufficient. Active dreams with punching and kicking 3-4x/ week. Has not hit wife. She wakes him from these and he describes vivid streams of fight and struggle. Already on Sinemet and followed by Neurology for Parkinsonian symptoms.  ROS-see HPI    + = positive Constitutional:   No-   weight loss, night sweats, fevers, chills, fatigue,  lassitude. HEENT:   No-  headaches, difficulty swallowing, tooth/dental problems, sore throat,       No-  sneezing, itching, ear ache, nasal congestion, post nasal drip,  CV:  No-   chest pain, orthopnea, PND, swelling in lower extremities, anasarca, dizziness, palpitations Resp: + shortness of breath with exertion or at rest.              No-   productive cough,  No non-productive cough,  No- coughing up of blood.              No-   change in color of mucus.  No- wheezing.   Skin: No-   rash or lesions. GI:  No-   heartburn, indigestion, abdominal pain, nausea, vomiting,  GU:  MS:  . Neuro-     +HPI Psych:  No- change in mood or affect. No depression or anxiety.  No memory loss.  OBJ- Physical Exam General- Alert, Oriented, Affect-appropriate, Distress- none acute.+ overweight, + rolling walker Skin- rash-none, lesions- none, excoriation- none Lymphadenopathy- none Head- atraumatic            Eyes- Gross vision intact, PERRLA, conjunctivae and secretions clear            Ears- Hearing, canals-normal            Nose- Clear, no-Septal dev, mucus, polyps, erosion, perforation             Throat- Mallampati III , mucosa clear , drainage- none, tonsils- atrophic Neck- flexible , trachea midline, no stridor , thyroid nl, carotid no  bruit Chest - symmetrical excursion , unlabored           Heart/CV- RRR , no murmur , no gallop  , no rub, nl s1 s2                           - JVD- none , edema- none, stasis changes- none, varices- none           Lung- clear to P&A, wheeze- none, cough- none , dullness-none, rub- none           Chest wall-  + pacemaker,  no rub, pacer pocket not involved. Abd- Br/ Gen/ Rectal- Not done, not indicated Extrem- cyanosis- none, clubbing, none, atrophy- none, strength- nl Neuro- grossly intact to observation, no tremor, + speech clear

## 2021-08-25 ENCOUNTER — Other Ambulatory Visit: Payer: Self-pay

## 2021-08-25 ENCOUNTER — Ambulatory Visit: Payer: Medicare HMO | Admitting: Internal Medicine

## 2021-08-25 ENCOUNTER — Encounter: Payer: Self-pay | Admitting: Internal Medicine

## 2021-08-25 DIAGNOSIS — Z9989 Dependence on other enabling machines and devices: Secondary | ICD-10-CM | POA: Diagnosis not present

## 2021-08-25 DIAGNOSIS — G47419 Narcolepsy without cataplexy: Secondary | ICD-10-CM | POA: Diagnosis not present

## 2021-08-25 DIAGNOSIS — I5032 Chronic diastolic (congestive) heart failure: Secondary | ICD-10-CM | POA: Diagnosis not present

## 2021-08-25 DIAGNOSIS — E1151 Type 2 diabetes mellitus with diabetic peripheral angiopathy without gangrene: Secondary | ICD-10-CM | POA: Diagnosis not present

## 2021-08-25 DIAGNOSIS — M19011 Primary osteoarthritis, right shoulder: Secondary | ICD-10-CM | POA: Diagnosis not present

## 2021-08-25 DIAGNOSIS — G4733 Obstructive sleep apnea (adult) (pediatric): Secondary | ICD-10-CM | POA: Diagnosis not present

## 2021-08-25 DIAGNOSIS — G20 Parkinson's disease: Secondary | ICD-10-CM | POA: Diagnosis not present

## 2021-08-25 DIAGNOSIS — G4752 REM sleep behavior disorder: Secondary | ICD-10-CM

## 2021-08-25 DIAGNOSIS — S32402D Unspecified fracture of left acetabulum, subsequent encounter for fracture with routine healing: Secondary | ICD-10-CM | POA: Diagnosis not present

## 2021-08-25 DIAGNOSIS — M19041 Primary osteoarthritis, right hand: Secondary | ICD-10-CM | POA: Diagnosis not present

## 2021-08-25 DIAGNOSIS — I110 Hypertensive heart disease with heart failure: Secondary | ICD-10-CM | POA: Diagnosis not present

## 2021-08-25 MED ORDER — CLONAZEPAM 0.5 MG PO TABS: ORAL_TABLET | ORAL | 0 refills | Status: DC

## 2021-08-25 NOTE — Assessment & Plan Note (Signed)
Stable benefit from Adderall 10 mg, once daily and naps if neededd.

## 2021-08-25 NOTE — Patient Instructions (Signed)
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 Try taking it about 30 minutes before  bedtime, so it is more likely to wear off by morning.   We will leave the Adderall and the CPAP settings alone for now.

## 2021-08-25 NOTE — Assessment & Plan Note (Signed)
Benefits from CPAP. May need to change mask style for better leak control, so we can raise pressure for better apnea control. After discussion, we are going to let him have more stable time back at home, and revisit this issue in the future.

## 2021-08-25 NOTE — Assessment & Plan Note (Signed)
Combative activity with dreams is very suggestive. Already being managed for Parkinson's symptoms. I suggested a limited trial of clonazepam during sleep, which may suppress some of the activity. His wife has not felt threatened or hurt.

## 2021-08-27 DIAGNOSIS — S32402D Unspecified fracture of left acetabulum, subsequent encounter for fracture with routine healing: Secondary | ICD-10-CM | POA: Diagnosis not present

## 2021-08-27 DIAGNOSIS — G4733 Obstructive sleep apnea (adult) (pediatric): Secondary | ICD-10-CM | POA: Diagnosis not present

## 2021-08-27 DIAGNOSIS — M19041 Primary osteoarthritis, right hand: Secondary | ICD-10-CM | POA: Diagnosis not present

## 2021-08-27 DIAGNOSIS — G20 Parkinson's disease: Secondary | ICD-10-CM | POA: Diagnosis not present

## 2021-08-27 DIAGNOSIS — I5032 Chronic diastolic (congestive) heart failure: Secondary | ICD-10-CM | POA: Diagnosis not present

## 2021-08-27 DIAGNOSIS — M19011 Primary osteoarthritis, right shoulder: Secondary | ICD-10-CM | POA: Diagnosis not present

## 2021-08-27 DIAGNOSIS — G47419 Narcolepsy without cataplexy: Secondary | ICD-10-CM | POA: Diagnosis not present

## 2021-08-27 DIAGNOSIS — I110 Hypertensive heart disease with heart failure: Secondary | ICD-10-CM | POA: Diagnosis not present

## 2021-08-27 DIAGNOSIS — E1151 Type 2 diabetes mellitus with diabetic peripheral angiopathy without gangrene: Secondary | ICD-10-CM | POA: Diagnosis not present

## 2021-08-29 DIAGNOSIS — M6281 Muscle weakness (generalized): Secondary | ICD-10-CM | POA: Diagnosis not present

## 2021-08-31 DIAGNOSIS — M9904 Segmental and somatic dysfunction of sacral region: Secondary | ICD-10-CM | POA: Diagnosis not present

## 2021-08-31 DIAGNOSIS — M5136 Other intervertebral disc degeneration, lumbar region: Secondary | ICD-10-CM | POA: Diagnosis not present

## 2021-08-31 DIAGNOSIS — M9903 Segmental and somatic dysfunction of lumbar region: Secondary | ICD-10-CM | POA: Diagnosis not present

## 2021-08-31 DIAGNOSIS — M9905 Segmental and somatic dysfunction of pelvic region: Secondary | ICD-10-CM | POA: Diagnosis not present

## 2021-09-02 DIAGNOSIS — G47419 Narcolepsy without cataplexy: Secondary | ICD-10-CM | POA: Diagnosis not present

## 2021-09-02 DIAGNOSIS — M9904 Segmental and somatic dysfunction of sacral region: Secondary | ICD-10-CM | POA: Diagnosis not present

## 2021-09-02 DIAGNOSIS — I5032 Chronic diastolic (congestive) heart failure: Secondary | ICD-10-CM | POA: Diagnosis not present

## 2021-09-02 DIAGNOSIS — G4733 Obstructive sleep apnea (adult) (pediatric): Secondary | ICD-10-CM | POA: Diagnosis not present

## 2021-09-02 DIAGNOSIS — M5136 Other intervertebral disc degeneration, lumbar region: Secondary | ICD-10-CM | POA: Diagnosis not present

## 2021-09-02 DIAGNOSIS — M19011 Primary osteoarthritis, right shoulder: Secondary | ICD-10-CM | POA: Diagnosis not present

## 2021-09-02 DIAGNOSIS — M19041 Primary osteoarthritis, right hand: Secondary | ICD-10-CM | POA: Diagnosis not present

## 2021-09-02 DIAGNOSIS — I110 Hypertensive heart disease with heart failure: Secondary | ICD-10-CM | POA: Diagnosis not present

## 2021-09-02 DIAGNOSIS — E1151 Type 2 diabetes mellitus with diabetic peripheral angiopathy without gangrene: Secondary | ICD-10-CM | POA: Diagnosis not present

## 2021-09-02 DIAGNOSIS — M9903 Segmental and somatic dysfunction of lumbar region: Secondary | ICD-10-CM | POA: Diagnosis not present

## 2021-09-02 DIAGNOSIS — M9905 Segmental and somatic dysfunction of pelvic region: Secondary | ICD-10-CM | POA: Diagnosis not present

## 2021-09-02 DIAGNOSIS — G20 Parkinson's disease: Secondary | ICD-10-CM | POA: Diagnosis not present

## 2021-09-02 DIAGNOSIS — S32402D Unspecified fracture of left acetabulum, subsequent encounter for fracture with routine healing: Secondary | ICD-10-CM | POA: Diagnosis not present

## 2021-09-04 DIAGNOSIS — M545 Low back pain, unspecified: Secondary | ICD-10-CM | POA: Diagnosis not present

## 2021-09-04 DIAGNOSIS — M7918 Myalgia, other site: Secondary | ICD-10-CM | POA: Diagnosis not present

## 2021-09-04 DIAGNOSIS — W19XXXA Unspecified fall, initial encounter: Secondary | ICD-10-CM | POA: Diagnosis not present

## 2021-09-10 ENCOUNTER — Telehealth: Payer: Self-pay | Admitting: Neurology

## 2021-09-10 DIAGNOSIS — G47419 Narcolepsy without cataplexy: Secondary | ICD-10-CM | POA: Diagnosis not present

## 2021-09-10 DIAGNOSIS — G20 Parkinson's disease: Secondary | ICD-10-CM | POA: Diagnosis not present

## 2021-09-10 DIAGNOSIS — I110 Hypertensive heart disease with heart failure: Secondary | ICD-10-CM | POA: Diagnosis not present

## 2021-09-10 DIAGNOSIS — S32402D Unspecified fracture of left acetabulum, subsequent encounter for fracture with routine healing: Secondary | ICD-10-CM | POA: Diagnosis not present

## 2021-09-10 DIAGNOSIS — E1151 Type 2 diabetes mellitus with diabetic peripheral angiopathy without gangrene: Secondary | ICD-10-CM | POA: Diagnosis not present

## 2021-09-10 DIAGNOSIS — I5032 Chronic diastolic (congestive) heart failure: Secondary | ICD-10-CM | POA: Diagnosis not present

## 2021-09-10 DIAGNOSIS — G4733 Obstructive sleep apnea (adult) (pediatric): Secondary | ICD-10-CM | POA: Diagnosis not present

## 2021-09-10 DIAGNOSIS — M19011 Primary osteoarthritis, right shoulder: Secondary | ICD-10-CM | POA: Diagnosis not present

## 2021-09-10 DIAGNOSIS — M19041 Primary osteoarthritis, right hand: Secondary | ICD-10-CM | POA: Diagnosis not present

## 2021-09-10 NOTE — Telephone Encounter (Signed)
[facility]) reporting on fall 10/29 lost balance and he landed on lower back, He has difficulty walking since fall, pain 5 out of 10. Have weakness of left leg. Taken to Urgent Care 11/4, took xray; could not find anything. Does medication for Parkinson's need to be adjusted or not? Wife would like a call back.

## 2021-09-10 NOTE — Telephone Encounter (Signed)
Patient is actually followed by Dr. Leonie Man and [name]. I had provided a second opinion for concern for parkinsonism. I will copy Dr. Leonie Man and [name]. A sooner appointment can be considered if needed. I will let Dr. Leonie Man review.

## 2021-09-10 NOTE — Telephone Encounter (Signed)
Unable to view recent eval at urgent care. I do not believe increasing dose of Sinemet would be beneficial - he was previously rx'd 1 tab 3 times daily but only taking 1x per day maybe twice if he remembered so the dose wasn't actually decreased. The main concern at fall would be to rule out fracture in regards to back pain - if he had x-ray completed and no findings, then I would recommend he continue to work with therapies at this time. I do not think increasing Sinemet dose would be of any benefit at this time but will send to Dr. Leonie Man to weight in

## 2021-09-14 ENCOUNTER — Encounter: Payer: Self-pay | Admitting: Adult Health

## 2021-09-14 ENCOUNTER — Ambulatory Visit: Payer: Medicare HMO | Admitting: Adult Health

## 2021-09-14 VITALS — BP 154/97 | HR 76 | Ht 68.0 in

## 2021-09-14 DIAGNOSIS — R296 Repeated falls: Secondary | ICD-10-CM

## 2021-09-14 DIAGNOSIS — G20 Parkinson's disease: Secondary | ICD-10-CM | POA: Diagnosis not present

## 2021-09-14 DIAGNOSIS — G214 Vascular parkinsonism: Secondary | ICD-10-CM | POA: Diagnosis not present

## 2021-09-14 MED ORDER — CARBIDOPA-LEVODOPA 25-100 MG PO TABS: 1.0000 | ORAL_TABLET | Freq: Three times a day (TID) | ORAL | 5 refills | Status: DC

## 2021-09-14 MED ORDER — CARBIDOPA-LEVODOPA ER 25-100 MG PO TBCR: 1.0000 | EXTENDED_RELEASE_TABLET | Freq: Every day | ORAL | 5 refills | Status: DC

## 2021-09-14 NOTE — Telephone Encounter (Signed)
Called wife, LVM requesting call back to discuss call we got from [facility].

## 2021-09-14 NOTE — Telephone Encounter (Signed)
Noted, patient has FU in our office today with [name]. She can discuss at that time.

## 2021-09-14 NOTE — Progress Notes (Signed)
[facility] [address] [place] [address] [phone]       STROKE FOLLOW UP NOTE  [name] Date of Birth:  04-14-1941 Medical Record Number:  [id number]   Reason for Referral: stroke follow up  Chief Complaint  Patient presents with   Stroke    Rm 3 with spouse Jordan [name] Pt is having some mobility difficulty, wife has a written list of things to address      SUBJECTIVE:   Update 09/14/2021 JM: Returns for sooner scheduled visit accompanied by his wife.  Concern regarding worsening gait and functioning since prior visit. He did have a fall on 8/23 with subsequent left acetabular fracture - ortho eval recommended nonoperative management.  Admitted to SNF rehab and discharged on 9/23. Reports since he returned home from rehab, gait has gradually worsened (increased shuffling and decreased step height) and has been requiring more assistance.  Working with [facility] PT initially 2x weekly now only 1x per week. Suffered a fall 10/29. Eval at [facility] walk-in clinic with xray showing possible compression fracture per wife report (unable to personally view via epic).  He is currently awaiting eval by orthopedics. He has had f/u with ortho for hip fx since returning home and f/u visit scheduled end of this month. Per wife, currently limited ambulation and exercise - will only walk to go to the bathroom. PT currently on hold until further eval with our office and ortho.   Wife apparently increased Sinemet to 3 times daily (currently taking at 10am, 4pm and 10pm) approx 1 month ago. Per wife, slight improvement of gait and functioning since increase during the day but does have worsening gait and stiffness upon awakening. 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 Also reports hallucinations during SNF rehab stay - this has since improved.  Wife questions if Sinemet dosage needs to  be adjusted  Eval by Dr. Rexene Alberts for second opinion of gait disorder and wife concern of Parkinson's disease. 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         History provided for reference purposes only Update 05/12/2021 Dr. Leonie Man; He returns for follow-up after last visit with Jordan [name] nurse practitioner in April 2022.  Is accompanied by his wife.  Patient has had no recurrent stroke or TIA symptoms.  He continues to have mild dizziness and imbalance in the right side paresthesias and diminished fine motor skills.  He occasionally has tingling of his fingers or his feet but these are not bothersome.  He is quite active and works out 3 days a week at [employer].  He remains on Plavix which is tolerating well without bruising or bleeding.  He takes Lipitor 20 mg daily and is tolerating it well.  He states his primary care physician did check his lipid profile and A1c about 5 weeks ago but I do not have those results and he was told they were satisfactory.  He does have chronic low back pain and also complains of fatigue and tiredness.  He has not discussed this with primary physician had any work-up done for this.  Patient has history of parkinsonian tremor and has a prescription for Sinemet 25/103 times daily but states that he gets it only once a day or so as he forgets to take it and other times.  Tremor seems intermittent and not constant and is not functionally disabling.  Wife feels that he does have a shuffling gait particularly when he is tired.  He however denies significant bradykinesia and is able to get up and walk by himself easily without assistance.  He denies drooling of saliva or micrographia.  He has not had any particular worsening of his tremors or other parkinsonian symptoms in recent months.    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 Per wife, over the past 3 to 4 days, he has been experiencing increased daytime sleepiness where he is sleeping majority of the day as well as sleeping well at night.  Reports PCP recently changed Adderall IR to XR for narcolepsy but will occasionally miss a dose.  Also reports recently being started on sertraline 50 mg daily on 4/14 which he will take in the morning.  He also recently started PT on Monday for gait impairment and BPPV.  No other associated symptoms such as confusion, worsening cognition, weakness, numbness/tingling, speech deficit, visual changes or gait changes.  No further concerns at this time   Update 11/27/2020 JM: Jordan [name] returns per patient request for sooner scheduled visit due to continued gait impairment and lower back pain.  He is currently unaccompanied.  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  Tremors have been stable without worsening.  He does feel as though his ambulation is worse with increased back pain. He did have a mechanical fall seen in ED on 11/05/2020 thankfully without injury.   He does have chronic back pain previously doing routine exercise prior to his stroke but has not returned back to routine exercise since. He admits to sedentary life style with limited physical activity. He will fall asleep easily during the day watching TV and occasional issues with insomnia. 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  No further concerns at this time.  Update 09/15/2020 JM: Jordan [name] returns for stroke follow-up accompanied by his wife.  He has been doing well since prior visit with residual decreased right hand dexterity and decreased sensory right fingertips.  Denies new or worsening stroke/TIA symptoms. He does complain of  short-term memory loss such as forgetting an actors name or random fact.  He also continues to experience R>L upper extremity tremors and gait impairment with imbalance with fall in 06/2020 resulting in nasal bone fracture.  Wife reports shuffling type gait when fatigued.  Remains on Plavix and atorvastatin for secondary stroke prevention without side effects.  Blood pressure today 126/73.  Glucose level stable per patient.  Reports ongoing compliance with CPAP for OSA management managed by pulmonology Dr. Annamaria Boots.  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  He complains of excessive daytime fatigue, vivid dreams and occasional insomnia. Per patient, he has not trialed any other medication for narcolepsy.  No further concerns at this time.  Update 05/15/2020 JM: Mr. Aguiniga is being seen for hospital follow-up accompanied by his wife.  He was discharged home from CIR on 04/29/2020 after 11 day stay.  Residual deficits of mild imbalance and mildly decreased sensation R finger tips and decreased right hand fine motor skills. Does report ongoing improvement currently working with home health  PT. he has had gait impairment ongoing and right hand decreased fine motor control with tremors and was evaluated by Dr. Rexene Alberts on 12/11/2019 for possible Parkinson's but was not felt this was Parkinson's related.  He does continue to use cane for ambulation and denies any recent falls.  He is questioning return to driving as he was advised no driving at discharge until further cleared.  Completed 3 weeks DAPT and continues on Plavix alone without bleeding or bruising.  Continues on atorvastatin 40 mg daily without myalgias.  Blood pressure today 118/84.  Glucose levels stable.  Endorses ongoing use of CPAP for OSA management.  No further concerns at this time.   Stroke admission 04/14/2020 Mr. COLSEN MODI is a 80 y.o. male with history of  diabetes, stroke, hypertension, hyperlipidemia,  permanent cardiac pacemaker  who presented on 04/14/2020 with R sided decreased sensation.  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  Previously on clopidogrel and recommended DAPT for 3 weeks then clopidogrel alone.  Presented in hypertensive urgency with BP 204/116 and recommended long-term BP goal normotensive range.  LDL 57 and recommended continuation of atorvastatin 40 mg daily.  Uncontrolled DM with A1c 7.9.  Other stroke risk factors include prior stroke 01/2018 with multifocal punctate infarcts concerning for embolic pattern, former tobacco use, EtOH use, obesity, CAD, OSA on CPAP and permanent cardiac pacemaker.  Evaluated by therapies who recommended CIR for residual deficits of imbalance and safety affecting ADLs and mobility.  Discharged to CIR on 04/18/2020.  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 Small vessel disease. Sinus dz. ASPECTS 10.    CTA head & neck no LVO. Aortic atherosclerosis.  MRI  L thalamic infarct. Small vessel disease. Atrophy.  2D Echo EF 65-70%. No source of embolus. LA mildly dilated.  Pacemaker interrogation no afib LDL 57 -continued atorvastatin 40 mg daily HgbA1c 7.9 Lovenox 40 mg sq daily for VTE prophylaxis clopidogrel 75 mg daily prior to admission, now on aspirin 81 mg daily and clopidogrel 75 mg daily. Continue DAPT x 3 weeks then plavix alone   Therapy recommendations:  CIR Disposition:  CIR      ROS:   14 system review of systems performed and negative with exception of those listed in HPI  PMH:  Past Medical History:  Diagnosis Date   Arthritis    R shoulder, bone spur   Arthritis    "hands" (03/25/2016)   Chronic diastolic heart failure (HCC)    Chronic lower back pain    Coronary heart disease    Dr Pernell Dupre   H/O cardiovascular stress test    perhaps last one was 2009   Heart murmur    12/29/11 echo: mild MR, no AS,  trivial TR   History of gout    Hyperlipidemia    Hypertension    saw Dr. Linard Millers last earl;y- 2014, cardiac cath. last done ?2009, blocks seen didn't require any intervention at that point.   Narcolepsy    [name] 04-11-97; Mean Latency 1.13min, SOREM 2   OSA on CPAP    NPSG 12-13-98 AHI 22.7   PONV (postoperative nausea and vomiting)    Presence of permanent cardiac pacemaker    Shortness of breath    with exertion    Stroke (HCC)    Type II diabetes mellitus (HCC)     PSH:  Past Surgical History:  Procedure Laterality  Date   CARDIAC CATHETERIZATION  2009?   EP IMPLANTABLE DEVICE N/A 03/25/2016   Procedure: Pacemaker Implant - Dual Chamber;  Surgeon: Evans Lance, MD;  Location: [place];  Service: Cardiovascular;  Laterality: N/A;   FRACTURE SURGERY     INSERT / REPLACE / REMOVE PACEMAKER  03/24/2016   LEFT AND RIGHT HEART CATHETERIZATION WITH CORONARY ANGIOGRAM N/A 06/05/2014   Procedure: LEFT AND RIGHT HEART CATHETERIZATION WITH CORONARY ANGIOGRAM;  Surgeon: Sinclair Grooms, MD;  Location: [facility] C[facility];  Service: Cardiovascular;  Laterality: N/A;   NASAL FRACTURE SURGERY     SHOULDER ARTHROSCOPY WITH ROTATOR CUFF REPAIR AND SUBACROMIAL DECOMPRESSION Right 08/16/2013   Procedure: SHOULDER ARTHROSCOPY WITH ROTATOR CUFF REPAIR AND SUBACROMIAL DECOMPRESSION;  Surgeon: [name], MD;  Location: [place];  Service: Orthopedics;  Laterality: Right;  Right shoulder arthroscopy rotator cuff repair, subacromial decompression.   TONSILLECTOMY      Social History:  Social History   Socioeconomic History   Marital status: Married    Spouse name: Jordan [name]   Number of children: Not on file   Years of education: Not on file   Highest education level: Not on file  Occupational History   Occupation: [employer]   Occupation: [place] [employer]  Tobacco Use   Smoking status: Former    Packs/day: 2.50    Years: 6.00    Pack years: 15.00    Types: Cigarettes     Quit date: 06/05/1967    Years since quitting: 54.3   Smokeless tobacco: Never  Vaping Use   Vaping Use: Never used  Substance and Sexual Activity   Alcohol use: Yes    Comment: rare   Drug use: No   Sexual activity: Not Currently  Other Topics Concern   Not on file  Social History Narrative   Lives with wife and son   Right handed   Drinks 1 cups caffeine daily   Social Determinants of Health   Financial Resource Strain: Not on file  Food Insecurity: Not on file  Transportation Needs: Not on file  Physical Activity: Not on file  Stress: Not on file  Social Connections: Not on file  Intimate Partner Violence: Not on file    Family History:  Family History  Problem Relation Age of Onset   Sleep apnea Sister    Colon cancer Sister     Medications:   Current Outpatient Medications on File Prior to Visit  Medication Sig Dispense Refill   acetaminophen (TYLENOL) 500 MG tablet Take 1,000 mg by mouth 2 (two) times daily.     amphetamine-dextroamphetamine (ADDERALL) 10 MG tablet Take 1 tablet (10 mg total) by mouth daily with breakfast. 30 tablet 0   atorvastatin (LIPITOR) 40 MG tablet Take 0.5 tablets (20 mg total) by mouth daily. 30 tablet 0   carbidopa-levodopa (SINEMET) 25-100 MG tablet Take 1 tablet by mouth in the morning and at bedtime. (Patient taking differently: Take 1 tablet by mouth 3 (three) times daily.) 60 tablet 0   cholecalciferol (VITAMIN D3) 25 MCG (1000 UNIT) tablet Take 1,000 Units by mouth daily.     clonazePAM (KLONOPIN) 0.5 MG tablet 1 at bedtime as needed for sleep 30 tablet 0   clopidogrel (PLAVIX) 75 MG tablet Take 1 tablet (75 mg total) by mouth daily. 30 tablet 0   Coenzyme Q10 (CO Q-10 PO) Take 1 capsule by mouth daily.     lisinopril (ZESTRIL) 10 MG tablet Take 1 tablet (10 mg  total) by mouth daily. (Patient taking differently: Take 5 mg by mouth daily.) 30 tablet 0   Melatonin 10 MG TABS Take 10 mg by mouth at bedtime.     metFORMIN (GLUCOPHAGE)  500 MG tablet Take 500 mg by mouth. 4 tables once a day     metoprolol tartrate (LOPRESSOR) 25 MG tablet Take 1 tablet (25 mg total) by mouth 2 (two) times daily. 60 tablet 0   Multiple Vitamin (MULTIVITAMIN WITH MINERALS) TABS tablet Take 1 tablet by mouth daily.     naproxen sodium (ALEVE) 220 MG tablet Take 220 mg by mouth daily as needed.     NITROSTAT 0.4 MG SL tablet PLACE 1 TABLET (0.4 MG TOTAL) UNDER THE TONGUE EVERY 5 (FIVE) MINUTES AS NEEDED FOR CHEST PAIN. 25 tablet 5   PRESCRIPTION MEDICATION See admin instructions. CPAP- At bedtime     sertraline (ZOLOFT) 25 MG tablet Take 1 tablet (25 mg total) by mouth daily. 30 tablet 0   No current facility-administered medications on file prior to visit.    Allergies:  No Known Allergies    OBJECTIVE:  Physical Exam  Vitals:   09/14/21 0930  BP: (!) 154/97  Pulse: 76  Height: 5\' 8"  (1.727 m)    Body mass index is 33.09 kg/m. No results found.  General: Obese , very pleasant elderly Caucasian male, seated, in no evident distress Head: head normocephalic and atraumatic.   Neck: supple with no carotid or supraclavicular bruits Cardiovascular: regular rate and rhythm, no murmurs Musculoskeletal: no deformity Skin:  no rash/petichiae Vascular:  Normal pulses all extremities   Neurologic Exam Mental Status: Awake and fully alert. Fluent speech and language. Oriented to place and time. Recent and remote memory intact. Attention span, concentration and fund of knowledge appropriate. Mood and affect appropriate.  Diminished facial expression.  Glabellar tap is positive.  Cranial Nerves: Pupils equal, briskly reactive to light. Extraocular movements full without nystagmus. Visual fields full to confrontation. Hearing intact. Facial sensation intact. Face, tongue, palate moves normally and symmetrically.  Motor: Normal bulk and tone. Normal strength in all tested extremity muscles except mild bilateral hip flexor weakness.  Diminished  fine finger movements on the right and orbits left over right upper extremity.  Mild intermittent tremor of the right upper extremity at rest with absent with position holding. Froment's sign is negative. Sensory.: intact to touch , pinprick , position and vibratory sensation.  Coordination: Rapid alternating movements normal in all extremities except slightly decreased right hand. Finger-to-nose and heel-to-shin performed accurately bilaterally.  Slight decreased right hand fine motor control.  Mild and occasional right hand resting tremor.  B/l R>L UE postural tremor with mild R cogwheel rigidity. Mild R>L bradykinesia.   Gait and Station: Deferred Reflexes: 1+ and symmetric. Toes downgoing.        ASSESSMENT/PLAN: DARREON LUTES is a 80 y.o. 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  Recent fall 10/29 with worsening chronic back pain and gait. S/p left acetabular fracture 06/23/2021 after a mechanical fall with worsening gait and increased need for assistance since return home from SNF rehab    -likely worsening gait and increased need of assistance multifactorial in setting of chronic gait disorder with parkinsonism, recent hip fracture and chronic low back pain with ?  Compression  fracture after recent fall -per wife request, will continue Sinemet 25/100 tablet 3 times daily (take every 2-3 hours) and start Sinemet CR 25/100mg  nightly .  Discussed potential side effects and unknown benefit as unable to say if worsening symptoms solely due to parkinsonian. Both pt and wife verbalized understanding and wished to proceed -Continue working with [facility] PT - needs f/u with ortho for low back issues and continued f/u with ortho s/p hip fracture -Continue current stroke prevention measures - limited  discussion due to time constraints   Follow-up in 2 months or call earlier if needed    CC:  [name], MD   I spent a prolonged 90 minutes of face-to-face and non-face-to-face time with patient and wife.  This included previsit chart review, lab review, study review, order entry, electronic health record documentation, patient and wife education and prolonged discussion regarding vascular parkinsonism and use of Sinemet, worsening gait and other concerns as noted above and possible contributing factors and answered all other multiple questions to patient and wife satisfaction  Frann Rider, AGNP-BC  [facility] [address] [place], [place] [address]  Phone [phone] Fax [phone] Note: This document was prepared with digital dictation and possible smart phrase technology. Any transcriptional errors that result from this process are unintentional.

## 2021-09-14 NOTE — Patient Instructions (Signed)
Continue Sinemet three times daily (10am, 2-3pm and 5-6pm) and start Sinemet (controlled release) tablet at night time   Continue Plavix and atorvastatin 40mg  daily  for secondary stroke prevention  Continue to follow up with PCP regarding cholesterol and blood pressure management  Maintain strict control of hypertension with blood pressure goal below 130/90 and cholesterol with LDL cholesterol (bad cholesterol) goal below 70 mg/dL.          Thank you for coming to see [place] at [facility]. I hope we have been able to provide you high quality care today.  You may receive a patient satisfaction survey over the next few weeks. We would appreciate your feedback and comments so that we may continue to improve ourselves and the health of our patients. incresae

## 2021-09-15 NOTE — Progress Notes (Signed)
I agree with the above plan 

## 2021-09-17 DIAGNOSIS — M4856XA Collapsed vertebra, not elsewhere classified, lumbar region, initial encounter for fracture: Secondary | ICD-10-CM | POA: Diagnosis not present

## 2021-09-17 DIAGNOSIS — M5136 Other intervertebral disc degeneration, lumbar region: Secondary | ICD-10-CM | POA: Diagnosis not present

## 2021-09-17 DIAGNOSIS — M5451 Vertebrogenic low back pain: Secondary | ICD-10-CM | POA: Diagnosis not present

## 2021-09-17 DIAGNOSIS — M5459 Other low back pain: Secondary | ICD-10-CM | POA: Diagnosis not present

## 2021-09-21 DIAGNOSIS — I10 Essential (primary) hypertension: Secondary | ICD-10-CM | POA: Diagnosis not present

## 2021-09-21 DIAGNOSIS — S32000A Wedge compression fracture of unspecified lumbar vertebra, initial encounter for closed fracture: Secondary | ICD-10-CM | POA: Diagnosis not present

## 2021-09-21 DIAGNOSIS — Z23 Encounter for immunization: Secondary | ICD-10-CM | POA: Diagnosis not present

## 2021-09-21 DIAGNOSIS — G20 Parkinson's disease: Secondary | ICD-10-CM | POA: Diagnosis not present

## 2021-09-25 DIAGNOSIS — I5032 Chronic diastolic (congestive) heart failure: Secondary | ICD-10-CM | POA: Diagnosis not present

## 2021-09-25 DIAGNOSIS — G4733 Obstructive sleep apnea (adult) (pediatric): Secondary | ICD-10-CM | POA: Diagnosis not present

## 2021-09-25 DIAGNOSIS — M19011 Primary osteoarthritis, right shoulder: Secondary | ICD-10-CM | POA: Diagnosis not present

## 2021-09-25 DIAGNOSIS — M19041 Primary osteoarthritis, right hand: Secondary | ICD-10-CM | POA: Diagnosis not present

## 2021-09-25 DIAGNOSIS — G20 Parkinson's disease: Secondary | ICD-10-CM | POA: Diagnosis not present

## 2021-09-25 DIAGNOSIS — S32402D Unspecified fracture of left acetabulum, subsequent encounter for fracture with routine healing: Secondary | ICD-10-CM | POA: Diagnosis not present

## 2021-09-25 DIAGNOSIS — E1151 Type 2 diabetes mellitus with diabetic peripheral angiopathy without gangrene: Secondary | ICD-10-CM | POA: Diagnosis not present

## 2021-09-25 DIAGNOSIS — I110 Hypertensive heart disease with heart failure: Secondary | ICD-10-CM | POA: Diagnosis not present

## 2021-09-25 DIAGNOSIS — G47419 Narcolepsy without cataplexy: Secondary | ICD-10-CM | POA: Diagnosis not present

## 2021-09-29 ENCOUNTER — Other Ambulatory Visit: Payer: Self-pay | Admitting: Internal Medicine

## 2021-09-29 ENCOUNTER — Other Ambulatory Visit: Payer: Self-pay | Admitting: Family Medicine

## 2021-09-29 DIAGNOSIS — S32000A Wedge compression fracture of unspecified lumbar vertebra, initial encounter for closed fracture: Secondary | ICD-10-CM

## 2021-09-29 DIAGNOSIS — S32402D Unspecified fracture of left acetabulum, subsequent encounter for fracture with routine healing: Secondary | ICD-10-CM | POA: Diagnosis not present

## 2021-09-30 DIAGNOSIS — E1151 Type 2 diabetes mellitus with diabetic peripheral angiopathy without gangrene: Secondary | ICD-10-CM | POA: Diagnosis not present

## 2021-09-30 DIAGNOSIS — G20 Parkinson's disease: Secondary | ICD-10-CM | POA: Diagnosis not present

## 2021-09-30 DIAGNOSIS — I110 Hypertensive heart disease with heart failure: Secondary | ICD-10-CM | POA: Diagnosis not present

## 2021-09-30 DIAGNOSIS — G4733 Obstructive sleep apnea (adult) (pediatric): Secondary | ICD-10-CM | POA: Diagnosis not present

## 2021-09-30 DIAGNOSIS — S32402D Unspecified fracture of left acetabulum, subsequent encounter for fracture with routine healing: Secondary | ICD-10-CM | POA: Diagnosis not present

## 2021-09-30 DIAGNOSIS — I5032 Chronic diastolic (congestive) heart failure: Secondary | ICD-10-CM | POA: Diagnosis not present

## 2021-09-30 DIAGNOSIS — M19011 Primary osteoarthritis, right shoulder: Secondary | ICD-10-CM | POA: Diagnosis not present

## 2021-09-30 DIAGNOSIS — G47419 Narcolepsy without cataplexy: Secondary | ICD-10-CM | POA: Diagnosis not present

## 2021-09-30 DIAGNOSIS — M19041 Primary osteoarthritis, right hand: Secondary | ICD-10-CM | POA: Diagnosis not present

## 2021-09-30 NOTE — Telephone Encounter (Signed)
Clonazepam refill sent

## 2021-10-01 ENCOUNTER — Other Ambulatory Visit: Payer: Self-pay | Admitting: Adult Health

## 2021-10-01 DIAGNOSIS — E1151 Type 2 diabetes mellitus with diabetic peripheral angiopathy without gangrene: Secondary | ICD-10-CM | POA: Diagnosis not present

## 2021-10-01 DIAGNOSIS — I5032 Chronic diastolic (congestive) heart failure: Secondary | ICD-10-CM | POA: Diagnosis not present

## 2021-10-01 DIAGNOSIS — M19011 Primary osteoarthritis, right shoulder: Secondary | ICD-10-CM | POA: Diagnosis not present

## 2021-10-01 DIAGNOSIS — M19041 Primary osteoarthritis, right hand: Secondary | ICD-10-CM | POA: Diagnosis not present

## 2021-10-01 DIAGNOSIS — Z8673 Personal history of transient ischemic attack (TIA), and cerebral infarction without residual deficits: Secondary | ICD-10-CM

## 2021-10-01 DIAGNOSIS — S32402D Unspecified fracture of left acetabulum, subsequent encounter for fracture with routine healing: Secondary | ICD-10-CM | POA: Diagnosis not present

## 2021-10-01 DIAGNOSIS — G47419 Narcolepsy without cataplexy: Secondary | ICD-10-CM | POA: Diagnosis not present

## 2021-10-01 DIAGNOSIS — G4733 Obstructive sleep apnea (adult) (pediatric): Secondary | ICD-10-CM | POA: Diagnosis not present

## 2021-10-01 DIAGNOSIS — G20 Parkinson's disease: Secondary | ICD-10-CM | POA: Diagnosis not present

## 2021-10-01 DIAGNOSIS — I110 Hypertensive heart disease with heart failure: Secondary | ICD-10-CM | POA: Diagnosis not present

## 2021-10-05 ENCOUNTER — Other Ambulatory Visit (HOSPITAL_COMMUNITY): Payer: Self-pay | Admitting: Orthopedic Surgery

## 2021-10-05 DIAGNOSIS — I110 Hypertensive heart disease with heart failure: Secondary | ICD-10-CM | POA: Diagnosis not present

## 2021-10-05 DIAGNOSIS — G20 Parkinson's disease: Secondary | ICD-10-CM | POA: Diagnosis not present

## 2021-10-05 DIAGNOSIS — M545 Low back pain, unspecified: Secondary | ICD-10-CM

## 2021-10-05 DIAGNOSIS — E1151 Type 2 diabetes mellitus with diabetic peripheral angiopathy without gangrene: Secondary | ICD-10-CM | POA: Diagnosis not present

## 2021-10-05 DIAGNOSIS — G47419 Narcolepsy without cataplexy: Secondary | ICD-10-CM | POA: Diagnosis not present

## 2021-10-05 DIAGNOSIS — S32402D Unspecified fracture of left acetabulum, subsequent encounter for fracture with routine healing: Secondary | ICD-10-CM | POA: Diagnosis not present

## 2021-10-05 DIAGNOSIS — I5032 Chronic diastolic (congestive) heart failure: Secondary | ICD-10-CM | POA: Diagnosis not present

## 2021-10-05 DIAGNOSIS — G4733 Obstructive sleep apnea (adult) (pediatric): Secondary | ICD-10-CM | POA: Diagnosis not present

## 2021-10-05 DIAGNOSIS — M19041 Primary osteoarthritis, right hand: Secondary | ICD-10-CM | POA: Diagnosis not present

## 2021-10-05 DIAGNOSIS — M19011 Primary osteoarthritis, right shoulder: Secondary | ICD-10-CM | POA: Diagnosis not present

## 2021-10-08 DIAGNOSIS — I110 Hypertensive heart disease with heart failure: Secondary | ICD-10-CM | POA: Diagnosis not present

## 2021-10-08 DIAGNOSIS — S32402D Unspecified fracture of left acetabulum, subsequent encounter for fracture with routine healing: Secondary | ICD-10-CM | POA: Diagnosis not present

## 2021-10-08 DIAGNOSIS — G47419 Narcolepsy without cataplexy: Secondary | ICD-10-CM | POA: Diagnosis not present

## 2021-10-08 DIAGNOSIS — E1151 Type 2 diabetes mellitus with diabetic peripheral angiopathy without gangrene: Secondary | ICD-10-CM | POA: Diagnosis not present

## 2021-10-08 DIAGNOSIS — M19041 Primary osteoarthritis, right hand: Secondary | ICD-10-CM | POA: Diagnosis not present

## 2021-10-08 DIAGNOSIS — G20 Parkinson's disease: Secondary | ICD-10-CM | POA: Diagnosis not present

## 2021-10-08 DIAGNOSIS — I5032 Chronic diastolic (congestive) heart failure: Secondary | ICD-10-CM | POA: Diagnosis not present

## 2021-10-08 DIAGNOSIS — G4733 Obstructive sleep apnea (adult) (pediatric): Secondary | ICD-10-CM | POA: Diagnosis not present

## 2021-10-08 DIAGNOSIS — M19011 Primary osteoarthritis, right shoulder: Secondary | ICD-10-CM | POA: Diagnosis not present

## 2021-10-12 DIAGNOSIS — G47419 Narcolepsy without cataplexy: Secondary | ICD-10-CM | POA: Diagnosis not present

## 2021-10-12 DIAGNOSIS — I5032 Chronic diastolic (congestive) heart failure: Secondary | ICD-10-CM | POA: Diagnosis not present

## 2021-10-12 DIAGNOSIS — S32402D Unspecified fracture of left acetabulum, subsequent encounter for fracture with routine healing: Secondary | ICD-10-CM | POA: Diagnosis not present

## 2021-10-12 DIAGNOSIS — M19011 Primary osteoarthritis, right shoulder: Secondary | ICD-10-CM | POA: Diagnosis not present

## 2021-10-12 DIAGNOSIS — E1151 Type 2 diabetes mellitus with diabetic peripheral angiopathy without gangrene: Secondary | ICD-10-CM | POA: Diagnosis not present

## 2021-10-12 DIAGNOSIS — G20 Parkinson's disease: Secondary | ICD-10-CM | POA: Diagnosis not present

## 2021-10-12 DIAGNOSIS — M19041 Primary osteoarthritis, right hand: Secondary | ICD-10-CM | POA: Diagnosis not present

## 2021-10-12 DIAGNOSIS — I110 Hypertensive heart disease with heart failure: Secondary | ICD-10-CM | POA: Diagnosis not present

## 2021-10-12 DIAGNOSIS — G4733 Obstructive sleep apnea (adult) (pediatric): Secondary | ICD-10-CM | POA: Diagnosis not present

## 2021-10-15 DIAGNOSIS — G4733 Obstructive sleep apnea (adult) (pediatric): Secondary | ICD-10-CM | POA: Diagnosis not present

## 2021-10-15 DIAGNOSIS — E1151 Type 2 diabetes mellitus with diabetic peripheral angiopathy without gangrene: Secondary | ICD-10-CM | POA: Diagnosis not present

## 2021-10-15 DIAGNOSIS — S32402D Unspecified fracture of left acetabulum, subsequent encounter for fracture with routine healing: Secondary | ICD-10-CM | POA: Diagnosis not present

## 2021-10-15 DIAGNOSIS — G47419 Narcolepsy without cataplexy: Secondary | ICD-10-CM | POA: Diagnosis not present

## 2021-10-15 DIAGNOSIS — I5032 Chronic diastolic (congestive) heart failure: Secondary | ICD-10-CM | POA: Diagnosis not present

## 2021-10-15 DIAGNOSIS — M19011 Primary osteoarthritis, right shoulder: Secondary | ICD-10-CM | POA: Diagnosis not present

## 2021-10-15 DIAGNOSIS — M19041 Primary osteoarthritis, right hand: Secondary | ICD-10-CM | POA: Diagnosis not present

## 2021-10-15 DIAGNOSIS — I110 Hypertensive heart disease with heart failure: Secondary | ICD-10-CM | POA: Diagnosis not present

## 2021-10-15 DIAGNOSIS — G20 Parkinson's disease: Secondary | ICD-10-CM | POA: Diagnosis not present

## 2021-10-16 ENCOUNTER — Other Ambulatory Visit: Payer: Self-pay | Admitting: Adult Health

## 2021-10-16 DIAGNOSIS — Z8673 Personal history of transient ischemic attack (TIA), and cerebral infarction without residual deficits: Secondary | ICD-10-CM

## 2021-10-22 LAB — CUP PACEART REMOTE DEVICE CHECK
Date Time Interrogation Session: 20221222081820
Implantable Lead Implant Date: 20170525
Implantable Lead Implant Date: 20170525
Implantable Lead Location: 753859
Implantable Lead Location: 753860
Implantable Lead Model: 377
Implantable Lead Model: 377
Implantable Lead Serial Number: 49470224
Implantable Lead Serial Number: 49485613
Implantable Pulse Generator Implant Date: 20170525
Pulse Gen Model: 394969
Pulse Gen Serial Number: 68798589

## 2021-10-28 ENCOUNTER — Emergency Department (HOSPITAL_COMMUNITY)
Admission: EM | Admit: 2021-10-28 | Discharge: 2021-10-29 | Disposition: A | Discharge status: Home / Self Care | Payer: Medicare HMO | Attending: Emergency Medicine | Admitting: Emergency Medicine

## 2021-10-28 ENCOUNTER — Encounter (HOSPITAL_COMMUNITY): Payer: Self-pay | Admitting: Emergency Medicine

## 2021-10-28 ENCOUNTER — Other Ambulatory Visit: Payer: Self-pay

## 2021-10-28 DIAGNOSIS — Z87891 Personal history of nicotine dependence: Secondary | ICD-10-CM | POA: Insufficient documentation

## 2021-10-28 DIAGNOSIS — N200 Calculus of kidney: Secondary | ICD-10-CM | POA: Diagnosis not present

## 2021-10-28 DIAGNOSIS — Z955 Presence of coronary angioplasty implant and graft: Secondary | ICD-10-CM | POA: Diagnosis not present

## 2021-10-28 DIAGNOSIS — R937 Abnormal findings on diagnostic imaging of other parts of musculoskeletal system: Secondary | ICD-10-CM | POA: Insufficient documentation

## 2021-10-28 DIAGNOSIS — Z79899 Other long term (current) drug therapy: Secondary | ICD-10-CM | POA: Diagnosis not present

## 2021-10-28 DIAGNOSIS — E1159 Type 2 diabetes mellitus with other circulatory complications: Secondary | ICD-10-CM | POA: Diagnosis not present

## 2021-10-28 DIAGNOSIS — Z7984 Long term (current) use of oral hypoglycemic drugs: Secondary | ICD-10-CM | POA: Insufficient documentation

## 2021-10-28 DIAGNOSIS — I5032 Chronic diastolic (congestive) heart failure: Secondary | ICD-10-CM | POA: Insufficient documentation

## 2021-10-28 DIAGNOSIS — I110 Hypertensive heart disease with heart failure: Secondary | ICD-10-CM | POA: Diagnosis not present

## 2021-10-28 DIAGNOSIS — Z950 Presence of cardiac pacemaker: Secondary | ICD-10-CM | POA: Diagnosis not present

## 2021-10-28 DIAGNOSIS — R140 Abdominal distension (gaseous): Secondary | ICD-10-CM | POA: Diagnosis not present

## 2021-10-28 DIAGNOSIS — N12 Tubulo-interstitial nephritis, not specified as acute or chronic: Secondary | ICD-10-CM | POA: Diagnosis not present

## 2021-10-28 DIAGNOSIS — K769 Liver disease, unspecified: Secondary | ICD-10-CM | POA: Diagnosis not present

## 2021-10-28 DIAGNOSIS — I2510 Atherosclerotic heart disease of native coronary artery without angina pectoris: Secondary | ICD-10-CM | POA: Insufficient documentation

## 2021-10-28 DIAGNOSIS — K5730 Diverticulosis of large intestine without perforation or abscess without bleeding: Secondary | ICD-10-CM | POA: Diagnosis not present

## 2021-10-28 DIAGNOSIS — R935 Abnormal findings on diagnostic imaging of other abdominal regions, including retroperitoneum: Secondary | ICD-10-CM

## 2021-10-28 LAB — URINALYSIS, ROUTINE W REFLEX MICROSCOPIC
Bacteria, UA: NONE SEEN
Bilirubin Urine: NEGATIVE
Glucose, UA: NEGATIVE mg/dL
Ketones, ur: 5 mg/dL — AB
Leukocytes,Ua: NEGATIVE
Nitrite: NEGATIVE
Protein, ur: 100 mg/dL — AB
RBC / HPF: >50 RBC/hpf — ABNORMAL HIGH (ref 0–5)
Specific Gravity, Urine: 1.031 — ABNORMAL HIGH (ref 1.005–1.030)
pH: 5 (ref 5.0–8.0)

## 2021-10-28 LAB — LIPASE, BLOOD: Lipase: 30 U/L (ref 11–51)

## 2021-10-28 LAB — CBC
HCT: 43.9 % (ref 39.0–52.0)
Hemoglobin: 14.4 g/dL (ref 13.0–17.0)
MCH: 30.1 pg (ref 26.0–34.0)
MCHC: 32.8 g/dL (ref 30.0–36.0)
MCV: 91.6 fL (ref 80.0–100.0)
Platelets: 278 10*3/uL (ref 150–400)
RBC: 4.79 MIL/uL (ref 4.22–5.81)
RDW: 14.4 % (ref 11.5–15.5)
WBC: 11 10*3/uL — ABNORMAL HIGH (ref 4.0–10.5)
nRBC: 0 % (ref 0.0–0.2)

## 2021-10-28 LAB — COMPREHENSIVE METABOLIC PANEL
ALT: 8 U/L (ref 0–44)
AST: 19 U/L (ref 15–41)
Albumin: 4 g/dL (ref 3.5–5.0)
Alkaline Phosphatase: 57 U/L (ref 38–126)
Anion gap: 10 (ref 5–15)
BUN: 18 mg/dL (ref 8–23)
CO2: 25 mmol/L (ref 22–32)
Calcium: 9.8 mg/dL (ref 8.9–10.3)
Chloride: 106 mmol/L (ref 98–111)
Creatinine, Ser: 0.82 mg/dL (ref 0.61–1.24)
GFR, Estimated: >60 mL/min (ref >60)
Glucose, Bld: 116 mg/dL — ABNORMAL HIGH (ref 70–99)
Potassium: 4.9 mmol/L (ref 3.5–5.1)
Sodium: 141 mmol/L (ref 135–145)
Total Bilirubin: 0.7 mg/dL (ref 0.3–1.2)
Total Protein: 7.1 g/dL (ref 6.5–8.1)

## 2021-10-28 NOTE — ED Triage Notes (Signed)
Pt BIB family, who reports pt had tea colored urine this morning. Denies abdominal pain, pain with urination, flank pain or new back pain. Reports issues with chronic lower back pain.

## 2021-10-29 ENCOUNTER — Emergency Department (HOSPITAL_COMMUNITY): Payer: Medicare HMO

## 2021-10-29 ENCOUNTER — Ambulatory Visit (INDEPENDENT_AMBULATORY_CARE_PROVIDER_SITE_OTHER): Payer: Medicare HMO

## 2021-10-29 DIAGNOSIS — I495 Sick sinus syndrome: Secondary | ICD-10-CM

## 2021-10-29 DIAGNOSIS — K5730 Diverticulosis of large intestine without perforation or abscess without bleeding: Secondary | ICD-10-CM | POA: Diagnosis not present

## 2021-10-29 DIAGNOSIS — N200 Calculus of kidney: Secondary | ICD-10-CM | POA: Diagnosis not present

## 2021-10-29 DIAGNOSIS — K769 Liver disease, unspecified: Secondary | ICD-10-CM | POA: Diagnosis not present

## 2021-10-29 NOTE — ED Provider Notes (Signed)
[facility] EMERGENCY DEPARTMENT Provider Note   CSN: [id number] Arrival date & time: 10/28/21  3235     History    Jordan [name] is a 80 y.o. male.  Patient is a 80 yo male with pmh of parkinson's disease presenting for brown urine. Patient is accompanied by his wife who contributes to history. States patient awoke yesterday and had brown urine. Denies abdominal pain, flank pain, hx of renal stones. Denies hx of dysuria, increased frequency, or increased urgency. Patient able to urinate on his own. No foley catheter. No hx of penile trauma.   The history is provided by the patient. No language interpreter was used.      Past Medical History:  Diagnosis Date   Arthritis    R shoulder, bone spur   Arthritis    "hands" (03/25/2016)   Chronic diastolic heart failure (HCC)    Chronic lower back pain    Coronary heart disease    Dr Pernell Dupre   H/O cardiovascular stress test    perhaps last one was 2009   Heart murmur    12/29/11 echo: mild MR, no AS, trivial TR   History of gout    Hyperlipidemia    Hypertension    saw Dr. Linard Millers last earl;y- 2014, cardiac cath. last done ?2009, blocks seen didn't require any intervention at that point.   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   Hallucinations, visual 07/30/2021   Acetabulum fracture, left ([place]) 06/25/2021   Acute hip pain, left 06/24/2021   Fall at home, initial encounter 06/24/2021   Leukocytosis 06/24/2021   Chronic diastolic heart failure ([place])    Sinusitis, acute maxillary 06/24/2020   AKI (acute kidney injury) ([place])    Labile blood glucose    Cough    Benign  essential HTN    Skin lesion of hand    Hypoalbuminemia due to protein-calorie malnutrition ([place])    Controlled type 2 diabetes mellitus with hyperglycemia, without long-term current use of insulin ([place])    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   Morbid obesity ([place])    Acute blood loss anemia    Acute thalamic infarction ([place]) 04/15/2020   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   EP IMPLANTABLE DEVICE N/A 03/25/2016   Procedure: Pacemaker Implant - Dual Chamber;  Surgeon: Evans Lance, MD;  Location: [facility];  Service: Cardiovascular;  Laterality: N/A;   FRACTURE SURGERY     INSERT / REPLACE / REMOVE PACEMAKER  03/24/2016  LEFT AND RIGHT HEART CATHETERIZATION WITH CORONARY ANGIOGRAM N/A 06/05/2014   Procedure: LEFT AND RIGHT HEART CATHETERIZATION WITH CORONARY ANGIOGRAM;  Surgeon: Sinclair Grooms, MD;  Location: [facility] CATH LAB;  Service: Cardiovascular;  Laterality: N/A;   NASAL FRACTURE SURGERY     SHOULDER ARTHROSCOPY WITH ROTATOR CUFF REPAIR AND SUBACROMIAL DECOMPRESSION Right 08/16/2013   Procedure: SHOULDER ARTHROSCOPY WITH ROTATOR CUFF REPAIR AND SUBACROMIAL DECOMPRESSION;  Surgeon: [name], MD;  Location: [facility];  Service: Orthopedics;  Laterality: Right;  Right shoulder arthroscopy rotator cuff repair, subacromial decompression.   TONSILLECTOMY         Family  History  Problem Relation Age of Onset   Sleep apnea Sister    Colon cancer Sister     Social History   Tobacco Use   Smoking status: Former    Packs/day: 2.50    Years: 6.00    Pack years: 15.00    Types: Cigarettes    Quit date: 06/05/1967    Years since quitting: 54.4   Smokeless tobacco: Never  Vaping Use   Vaping Use: Never used  Substance Use Topics   Alcohol use: Yes    Comment: rare   Drug use: No    Home Medications Prior to Admission medications   Medication Sig Start Date End Date Taking? Authorizing Provider  clonazePAM (KLONOPIN) 0.5 MG tablet TAKE 1 TABLET BY MOUTH EVERY DAY AT BEDTIME AS NEEDED FOR SLEEP 09/30/21   Young, Tarri Fuller D, MD  acetaminophen (TYLENOL) 500 MG tablet Take 1,000 mg by mouth 2 (two) times daily.    [provider]  amphetamine-dextroamphetamine (ADDERALL) 10 MG tablet Take 1 tablet (10 mg total) by mouth daily with breakfast. 07/30/21   Med[place]-Vargas, Monina C, NP  atorvastatin (LIPITOR) 40 MG tablet Take 0.5 tablets (20 mg total) by mouth daily. 07/30/21   Medina-Vargas, Monina C, NP  carbidopa-levodopa (SINEMET) 25-100 MG tablet Take 1 tablet by mouth 3 (three) times daily. 09/14/21   Frann Rider, NP  Carbidopa-Levodopa ER (SINEMET CR) 25-100 MG tablet controlled release Take 1 tablet by mouth at bedtime. 09/14/21   Frann Rider, NP  cholecalciferol (VITAMIN D3) 25 MCG (1000 UNIT) tablet Take 1,000 Units by mouth daily.    [provider]  clopidogrel (PLAVIX) 75 MG tablet Take 1 tablet (75 mg total) by mouth daily. 07/30/21   Medina-Vargas, Monina C, NP  Coenzyme Q10 (CO Q-10 PO) Take 1 capsule by mouth daily.    [provider]  lisinopril (ZESTRIL) 10 MG tablet Take 1 tablet (10 mg total) by mouth daily. Patient taking differently: Take 5 mg by mouth daily. 07/30/21   Medina-Vargas, Monina C, NP  Melatonin 10 MG TABS Take 10 mg by mouth at bedtime.    [provider]  metFORMIN (GLUCOPHAGE) 500 MG  tablet Take 500 mg by mouth. 4 tables once a day    [provider]  metoprolol tartrate (LOPRESSOR) 25 MG tablet Take 1 tablet (25 mg total) by mouth 2 (two) times daily. 07/30/21   Medina-Vargas, Monina C, NP  Multiple Vitamin (MULTIVITAMIN WITH MINERALS) TABS tablet Take 1 tablet by mouth daily. 02/19/18   Georgette Shell, MD  naproxen sodium (ALEVE) 220 MG tablet Take 220 mg by mouth daily as needed.    [provider]  NITROSTAT 0.4 MG SL tablet PLACE 1 TABLET (0.4 MG TOTAL) UNDER THE TONGUE EVERY 5 (FIVE) MINUTES AS NEEDED FOR CHEST PAIN. 03/31/16   Belva Crome, MD  PRESCRIPTION  MEDICATION See admin instructions. CPAP- At bedtime    [provider]  sertraline (ZOLOFT) 25 MG tablet Take 1 tablet (25 mg total) by mouth daily. 07/30/21   Medina-Vargas, Monina C, NP    Allergies    Patient has no known allergies.  Review of Systems   Review of Systems  Constitutional:  Negative for chills and fever.  HENT:  Negative for ear pain and sore throat.   Eyes:  Negative for pain and visual disturbance.  Respiratory:  Negative for cough and shortness of breath.   Cardiovascular:  Negative for chest pain and palpitations.  Gastrointestinal:  Negative for abdominal pain and vomiting.  Genitourinary:  Negative for dysuria and hematuria.       Discolored urine  Musculoskeletal:  Negative for arthralgias and back pain.  Skin:  Negative for color change and rash.  Neurological:  Negative for seizures and syncope.  All other systems reviewed and are negative.  Physical Exam Updated Vital Signs BP (!) 162/95    Pulse 71    Temp (!) 97.5 F (36.4 C) (Oral)    Resp 19    SpO2 97%   Physical Exam Vitals and nursing note reviewed.  Constitutional:      General: He is not in acute distress.    Appearance: He is well-developed.  HENT:     Head: Normocephalic and atraumatic.  Eyes:     Conjunctiva/sclera: Conjunctivae normal.  Cardiovascular:     Rate and Rhythm:  Normal rate and regular rhythm.     Heart sounds: No murmur heard. Pulmonary:     Effort: Pulmonary effort is normal. No respiratory distress.     Breath sounds: Normal breath sounds.  Abdominal:     Palpations: Abdomen is soft.     Tenderness: There is no abdominal tenderness.  Musculoskeletal:        General: No swelling.     Cervical back: Neck supple.  Skin:    General: Skin is warm and dry.     Capillary Refill: Capillary refill takes less than 2 seconds.  Neurological:     Mental Status: He is alert.  Psychiatric:        Mood and Affect: Mood normal.    ED Results / Procedures / Treatments   Labs (all labs ordered are listed, but only abnormal results are displayed) Labs Reviewed  COMPREHENSIVE METABOLIC PANEL - Abnormal; Notable for the following components:      Result Value   Glucose, Bld 116 (*)    All other components within normal limits  CBC - Abnormal; Notable for the following components:   WBC 11.0 (*)    All other components within normal limits  URINALYSIS, ROUTINE W REFLEX MICROSCOPIC - Abnormal; Notable for the following components:   Color, Urine AMBER (*)    APPearance CLOUDY (*)    Specific Gravity, Urine 1.031 (*)    Hgb urine dipstick LARGE (*)    Ketones, ur 5 (*)    Protein, ur 100 (*)    RBC / HPF >50 (*)    All other components within normal limits  LIPASE, BLOOD    EKG None  Radiology CT Renal Stone Study  Addendum Date: 10/29/2021   ADDENDUM REPORT: 10/29/2021 11:53 ADDENDUM: Findings discussed with Dr. Pearline Cables via telephone at 11:51 AM. Electronically Signed   By: Margaretha Sheffield M.D.   On: 10/29/2021 11:53   Result Date: 10/29/2021 CLINICAL DATA:  hematuria EXAM: CT ABDOMEN AND PELVIS WITHOUT CONTRAST TECHNIQUE:  Multidetector CT imaging of the abdomen and pelvis was performed following the standard protocol without IV contrast. COMPARISON:  CT January 23, 2014. FINDINGS: Lower chest: Incompletely imaged 4 mm pulmonary nodule in the  right middle lobe (series 4, image 1). Bibasilar linear scarring/atelectasis. Coronary artery atherosclerosis. Partially imaged probable pacemaker leads. Hepatobiliary: Low-attenuation lesion within the right hepatic lobe which measures approximately 3.3 cm with areas of internal calcification and increased density. This area correlates with a punctate calcification on the prior CT but no definite lesion in that region on that study. This lesion does not definitely correlate with the cystic lesion seen on prior ultrasound given it appears to be more posteriorly located. Additional low-attenuation lesion in the hepatic dome measures 1.7 cm, similar versus slightly increased from 2015 and likely a cyst. Pancreas: Unremarkable. No pancreatic ductal dilatation or surrounding inflammatory changes. Spleen: Normal in size without focal abnormality. Adrenals/Urinary Tract: Approximately 1 cm calculus in the left renal collecting system without evidence of hydronephrosis. Punctate calculus versus papillary tip calcification in the right kidney. Bilateral low-attenuation renal lesions, compatible with cyst. Unremarkable bladder. Normal appearance of the adrenal glands. Stomach/Bowel: No evidence of obstruction. Moderate colonic stool burden. Colonic diverticulosis without evidence of diverticulitis. Vascular/Lymphatic: Calcific atherosclerosis of the aorta. Similar calcified lesion within the central mesentery, probably the sequela of prior inflammation. Reproductive: Mild Prostatomegaly. Musculoskeletal: Prior fractures of the left acetabulum, left iliac bone and left superior pubic ramus. Unhealed, probably acute or subacute fracture of the L2 vertebral body with approximately 30-40% height loss. IMPRESSION: 1. Unhealed, probably acute or subacute fracture of the L2 vertebral body with approximately 30-40% height loss. 2. Approximately 1 cm calculus in the left collecting system and multiple smaller left renal calculi.  Punctate calculus versus papillary tip calcification in the right kidney. No hydronephrosis. 3. Complex, mixed density l 3.3 cm esion within the right hepatic lobe, which does not definitely correlate with the lesion seen on prior ultrasound and appears new or increased relative to 2015 CT. This lesion is incompletely characterized without contrast. Recommend follow-up MRI if the patient is able. If MRI is contraindicated due to patient's pacemaker, recommend follow-up CT with and without contrast. 4. Colonic diverticulosis without evidence of diverticulitis. 5. Prior fractures of the left acetabulum, left iliac bone and left superior pubic ramus. 6. Partially imaged 4 mm pulmonary nodule in the right middle lobe. No follow-up needed if patient is low-risk. Non-contrast chest CT can be considered in 12 months if patient is high-risk. This recommendation follows the consensus statement: Guidelines for Management of Incidental Pulmonary Nodules Detected on CT Images: From the Fleischner Society 2017; Radiology 2017; 284:228-243. These results will be called to the ordering clinician or representative by the Radiologist Assistant, and communication documented in the PACS or [employer]. Electronically Signed: By: Margaretha Sheffield M.D. On: 10/29/2021 11:44    Procedures Procedures   Medications Ordered in ED Medications - No data to display  ED Course  I have reviewed the triage vital signs and the nursing notes.  Pertinent labs & imaging results that were available during my care of the patient were reviewed by me and considered in my medical decision making (see chart for details).    MDM Rules/Calculators/A&P                         9:25 PM 80 yo male with pmh of parkinson's disease presenting for discolored urine without any other s/s. Pt Aox3, no acute  distress, afebrile, with stable vitals. Physical exam demonstrates soft nontender abdomen.   Stable renal function UA demonstrates no UTI.  Positive for hematuria. CT renal stone positive for nephrolithiasis approx 1 cm. I spoke with urologist on call who ensures close follow up in office and likely lithotripsy outpatient. Patient is pain free at this time.   Incidental finding of L2 compression fracture. Wife aware stating it has already been discovered and they are already seeking care for it.   Patient in no distress and overall condition improved here in the ED. Detailed discussions were had with the patient regarding current findings, and need for close f/u with PCP or on call doctor. The patient has been instructed to return immediately if the symptoms worsen in any way for re-evaluation. Patient verbalized understanding and is in agreement with current care plan. All questions answered prior to discharge.   Final Clinical Impression(s) / ED Diagnoses Final diagnoses:  Abnormal CT of the abdomen-L2 compression fracture  Nephrolithiasis    Rx / DC Orders ED Discharge Orders     None        Lianne Cure, DO 15/40/08 2128

## 2021-10-29 NOTE — ED Notes (Signed)
Pt wheeled to waiting room. Pt verbalized understanding of discharge instructions.   

## 2021-10-29 NOTE — ED Notes (Signed)
Patient transported to CT 

## 2021-10-30 DIAGNOSIS — I110 Hypertensive heart disease with heart failure: Secondary | ICD-10-CM | POA: Diagnosis not present

## 2021-10-30 DIAGNOSIS — E1151 Type 2 diabetes mellitus with diabetic peripheral angiopathy without gangrene: Secondary | ICD-10-CM | POA: Diagnosis not present

## 2021-10-30 DIAGNOSIS — I5032 Chronic diastolic (congestive) heart failure: Secondary | ICD-10-CM | POA: Diagnosis not present

## 2021-10-30 DIAGNOSIS — S32402D Unspecified fracture of left acetabulum, subsequent encounter for fracture with routine healing: Secondary | ICD-10-CM | POA: Diagnosis not present

## 2021-10-30 DIAGNOSIS — M19041 Primary osteoarthritis, right hand: Secondary | ICD-10-CM | POA: Diagnosis not present

## 2021-10-30 DIAGNOSIS — G47419 Narcolepsy without cataplexy: Secondary | ICD-10-CM | POA: Diagnosis not present

## 2021-10-30 DIAGNOSIS — G4733 Obstructive sleep apnea (adult) (pediatric): Secondary | ICD-10-CM | POA: Diagnosis not present

## 2021-10-30 DIAGNOSIS — G20 Parkinson's disease: Secondary | ICD-10-CM | POA: Diagnosis not present

## 2021-10-30 DIAGNOSIS — M19011 Primary osteoarthritis, right shoulder: Secondary | ICD-10-CM | POA: Diagnosis not present

## 2021-11-03 ENCOUNTER — Telehealth: Payer: Self-pay | Admitting: Adult Health

## 2021-11-03 NOTE — Telephone Encounter (Signed)
Pt's wife called asking if her husband could be referred out for some physical therapy. Pt's wife requesting a call back.

## 2021-11-03 NOTE — Telephone Encounter (Signed)
Would advise her to further discuss with ortho or other provider monitoring lumbar compression fracture to ensure this is the appropriate route to go at this time. If they deem appropriate, they can place the orders. Thank you

## 2021-11-03 NOTE — Telephone Encounter (Signed)
Pt would like PT, do you agree? Please advise

## 2021-11-04 NOTE — Telephone Encounter (Signed)
Contacted Wife back, informed her of jessica recommendations. She wasn't sure if ortho would place the order due to him not being seen yet, has upcoming appt in Feb. Advised her to give them a call and see what they recommends. She understood and was appreciative.

## 2021-11-10 NOTE — Progress Notes (Signed)
Remote pacemaker transmission.   

## 2021-11-12 ENCOUNTER — Telehealth: Payer: Self-pay | Admitting: Internal Medicine

## 2021-11-12 NOTE — Telephone Encounter (Signed)
ATC patient because he is scheduled for 2 appointments with Dr. Annamaria Boots in February. One is 2/24 and the other is 2/28. One of these can be canceled and keep whichever one he wants.

## 2021-11-19 ENCOUNTER — Ambulatory Visit: Payer: Medicare HMO | Admitting: Adult Health

## 2021-11-19 ENCOUNTER — Other Ambulatory Visit: Payer: Self-pay

## 2021-11-19 ENCOUNTER — Ambulatory Visit (HOSPITAL_COMMUNITY)
Admission: RE | Admit: 2021-11-19 | Discharge: 2021-11-19 | Disposition: A | Discharge status: Home / Self Care | Payer: Medicare (Managed Care) | Source: Ambulatory Visit | Attending: Orthopedic Surgery | Admitting: Orthopedic Surgery

## 2021-11-19 DIAGNOSIS — M545 Low back pain, unspecified: Secondary | ICD-10-CM | POA: Insufficient documentation

## 2021-11-19 DIAGNOSIS — S32020A Wedge compression fracture of second lumbar vertebra, initial encounter for closed fracture: Secondary | ICD-10-CM | POA: Diagnosis not present

## 2021-11-19 NOTE — Telephone Encounter (Signed)
ATC patient x2, LMTCB °

## 2021-11-24 ENCOUNTER — Encounter (HOSPITAL_COMMUNITY): Payer: Self-pay | Admitting: Radiology

## 2021-11-27 DIAGNOSIS — R42 Dizziness and giddiness: Secondary | ICD-10-CM | POA: Diagnosis not present

## 2021-11-27 DIAGNOSIS — H6123 Impacted cerumen, bilateral: Secondary | ICD-10-CM | POA: Diagnosis not present

## 2021-11-29 DIAGNOSIS — M6281 Muscle weakness (generalized): Secondary | ICD-10-CM | POA: Diagnosis not present

## 2021-12-07 DIAGNOSIS — Z8673 Personal history of transient ischemic attack (TIA), and cerebral infarction without residual deficits: Secondary | ICD-10-CM | POA: Diagnosis not present

## 2021-12-07 DIAGNOSIS — M48062 Spinal stenosis, lumbar region with neurogenic claudication: Secondary | ICD-10-CM | POA: Diagnosis not present

## 2021-12-07 DIAGNOSIS — M4856XD Collapsed vertebra, not elsewhere classified, lumbar region, subsequent encounter for fracture with routine healing: Secondary | ICD-10-CM | POA: Diagnosis not present

## 2021-12-07 DIAGNOSIS — D6869 Other thrombophilia: Secondary | ICD-10-CM | POA: Diagnosis not present

## 2021-12-08 ENCOUNTER — Ambulatory Visit: Payer: Medicare (Managed Care) | Admitting: Adult Health

## 2021-12-08 ENCOUNTER — Telehealth: Payer: Self-pay | Admitting: Internal Medicine

## 2021-12-08 ENCOUNTER — Encounter: Payer: Self-pay | Admitting: Adult Health

## 2021-12-08 VITALS — BP 112/75 | HR 68

## 2021-12-08 DIAGNOSIS — M542 Cervicalgia: Secondary | ICD-10-CM | POA: Diagnosis not present

## 2021-12-08 DIAGNOSIS — I6381 Other cerebral infarction due to occlusion or stenosis of small artery: Secondary | ICD-10-CM

## 2021-12-08 DIAGNOSIS — E538 Deficiency of other specified B group vitamins: Secondary | ICD-10-CM

## 2021-12-08 DIAGNOSIS — E559 Vitamin D deficiency, unspecified: Secondary | ICD-10-CM

## 2021-12-08 DIAGNOSIS — G214 Vascular parkinsonism: Secondary | ICD-10-CM

## 2021-12-08 DIAGNOSIS — R2689 Other abnormalities of gait and mobility: Secondary | ICD-10-CM | POA: Diagnosis not present

## 2021-12-08 NOTE — Patient Instructions (Signed)
You will be called to schedule an MRI of your neck as neck issues can contribute to imbalance and gait difficulties  We will check lab work as well today to look for revisable causes  Continue Sinemet as scheduled  Start PT as advised by Dr. Nelva Bush  Continue clopidogrel 75 mg daily  and atorvastatin  for secondary stroke prevention  Continue to follow up with PCP regarding cholesterol, blood pressure and diabetes management  Maintain strict control of hypertension with blood pressure goal below 130/90, diabetes with hemoglobin A1c goal below 7.0 % and cholesterol with LDL cholesterol (bad cholesterol) goal below 70 mg/dL.   Signs of a Stroke? Follow the BEFAST method:  Balance Watch for a sudden loss of balance, trouble with coordination or vertigo Eyes Is there a sudden loss of vision in one or both eyes? Or double vision?  Face: Ask the person to smile. Does one side of the face droop or is it numb?  Arms: Ask the person to raise both arms. Does one arm drift downward? Is there weakness or numbness of a leg? Speech: Ask the person to repeat a simple phrase. Does the speech sound slurred/strange? Is the person confused ? Time: If you observe any of these signs, call 911.    Followup in the future with me in 4 months with Dr. Leonie Man or call earlier if needed       Thank you for coming to see [place] at [facility]. I hope we have been able to provide you high quality care today.  You may receive a patient satisfaction survey over the next few weeks. We would appreciate your feedback and comments so that we may continue to improve ourselves and the health of our patients.

## 2021-12-08 NOTE — Telephone Encounter (Signed)
ATC patient-unable to leave vm due to mailbox being full.  Will call back.  

## 2021-12-08 NOTE — Progress Notes (Signed)
[facility] [address] [place]. [place] [address] [phone]       STROKE FOLLOW UP NOTE  [name] Date of Birth:  1941-04-01 Medical Record Number:  [id number]   Reason for Referral: stroke follow up   Chief Complaint  Patient presents with   Follow-up    Rm 3 with spouse Jordan [name]  Pt is well, having worsening gait and mobility      SUBJECTIVE:   Update 12/08/2021 JM: Returns for follow-up visit after prior visit 3 months ago for concern of worsening gait and functioning. Hx of prior stroke and parkinsonism. At prior visit, adjusted Sinemet dosage - currently on 25-100 1 tab 3x daily and ER 25-100 nightly. Per patient and wife, gait has remained the same - denies worsening but also denies any improvement. Tolerating Sinemet without side effects but no noticeable difference since dose change 3 months ago. Recently seen by Jordan [name] for lower back issues and plans to start PT for primarily lumbar spine on 3/2. Continues to use RW but limited ambulate and only short distance. Does c/o neck and shoulder pain which is chronic but gradually worsening. Limited neck ROM. Denies radiculopathy. No prior cervical imaging. At times, will have difficulty holding head up and sits in a hunched over position. He will have difficulty holding on to small, especially lighter objects, right > left since stroke. C/o shortness of breath after talking for prolonged periods of time which has been present over the past 4-5 months. Denies SOB on exertion but wife believes that he does. Wife also notes voice being softer. Denies any swallowing difficulties. Does have chronic diabetic neuropathy with some worsening over the past 6 months. Recent A1c satisfactory. BUE tremors stable. He also c/o continued fatigue.   Stable from stroke standpoint without new stroke/TIA symptoms. Compliant on Plavix and atorvastatin without side effects.  Blood pressure today 112/75. Recent lipid panel  satisfactory.   No further concerns at this time     History provided for reference purposes only Update 09/14/2021 JM: Returns for sooner scheduled visit accompanied by his wife.  Concern regarding worsening gait and functioning since prior visit. He did have a fall on 8/23 with subsequent left acetabular fracture - ortho eval recommended nonoperative management.  Admitted to SNF rehab and discharged on 9/23. Reports since he returned home from rehab, gait has gradually worsened (increased shuffling and decreased step height) and has been requiring more assistance.  Working with [facility] PT initially 2x weekly now only 1x per week. Suffered a fall 10/29. Eval at [facility] walk-in clinic with xray showing possible compression fracture per wife report (unable to personally view via epic).  He is currently awaiting eval by orthopedics. He has had f/u with ortho for hip fx since returning home and f/u visit scheduled end of this month. Per wife, currently limited ambulation and exercise - will only walk to go to the bathroom. PT currently on hold until further eval with our office and ortho.   Wife apparently increased Sinemet to 3 times daily (currently taking at 10am, 4pm and 10pm) approx 1 month ago. Per wife, slight improvement of gait and functioning since increase during the day but does have worsening gait and stiffness upon awakening. 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 Also reports hallucinations during SNF rehab stay - this has since improved.  Wife questions if Sinemet dosage needs to be adjusted  Eval by Dr. Rexene [name] for second opinion of gait disorder and wife concern of Parkinson's disease. 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    Update 05/12/2021 Jordan [name]; He returns for  follow-up after last visit with Jordan [name] nurse practitioner in April 2022.  Is accompanied by his wife.  Patient has had no recurrent stroke or TIA symptoms.  He continues to have mild dizziness and imbalance in the right side paresthesias and diminished fine motor skills.  He occasionally has tingling of his fingers or his feet but these are not bothersome.  He is quite active and works out 3 days a week at [employer].  He remains on Plavix which is tolerating well without bruising or bleeding.  He takes Lipitor 20 mg daily and is tolerating it well.  He states his primary care physician did check his lipid profile and A1c about 5 weeks ago but I do not have those results and he was told they were satisfactory.  He does have chronic low back pain and also complains of fatigue and tiredness.  He has not discussed this with primary physician had any work-up done for this.  Patient has history of parkinsonian tremor and has a prescription for Sinemet 25/103 times daily but states that he gets it only once a day or so as he forgets to take it and other times.  Tremor seems intermittent and not constant and is not functionally disabling.  Wife feels that he does have a shuffling gait particularly when he is tired.  He however denies significant bradykinesia and is able to get up and walk by himself easily without assistance.  He denies drooling of saliva or micrographia.  He has not had any particular worsening of his tremors or other parkinsonian symptoms in recent months.    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 Per wife, over the past 3 to 4 days, he has been experiencing increased daytime sleepiness where he is sleeping majority of the day as well as sleeping well at night.  Reports PCP recently changed Adderall IR to XR for narcolepsy but will occasionally miss a dose.   Also reports recently being started on sertraline 50 mg daily on 4/14 which he will take in the morning.  He also recently started PT on Monday for gait impairment and BPPV.  No other associated symptoms such as confusion, worsening cognition, weakness, numbness/tingling, speech deficit, visual changes or gait changes.  No further concerns at this time   Update 11/27/2020 JM: Jordan [name] returns per patient request for sooner scheduled visit due to continued gait impairment and lower back pain.  He is currently unaccompanied.  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  Tremors have been stable without worsening.  He does feel as though his ambulation is worse with increased back pain. He did have a mechanical fall seen in ED on 11/05/2020 thankfully without injury.   He does have chronic back pain previously doing routine exercise prior to his stroke but has not returned back to routine exercise since. He admits to sedentary life style with limited physical activity. He will fall asleep easily during the day watching TV and occasional issues with insomnia. He will take  adderral 10mg  daily for narcolepsy and will take additional tablet as needed during the day.  No further concerns at this time.  Update 09/15/2020 JM: Jordan [name] returns for stroke follow-up accompanied by his wife.  He has been doing well since prior visit with residual decreased right hand dexterity and decreased sensory right fingertips.  Denies new or worsening stroke/TIA symptoms. He does complain of short-term memory loss such as forgetting an actors name or random fact.  He also continues to experience R>L upper extremity tremors and gait impairment with imbalance with fall in 06/2020 resulting in nasal bone fracture.  Wife reports shuffling type gait when fatigued.  Remains on Plavix and atorvastatin for secondary stroke prevention without side effects.  Blood pressure today 126/73.   Glucose level stable per patient.  Reports ongoing compliance with CPAP for OSA management managed by pulmonology Dr. Annamaria [name].  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  He complains of excessive daytime fatigue, vivid dreams and occasional insomnia. Per patient, he has not trialed any other medication for narcolepsy.  No further concerns at this time.  Update 05/15/2020 JM: Jordan [name] is being seen for hospital follow-up accompanied by his wife.  He was discharged home from CIR on 04/29/2020 after 11 day stay.  Residual deficits of mild imbalance and mildly decreased sensation R finger tips and decreased right hand fine motor skills. Does report ongoing improvement currently working with home health PT. he has had gait impairment ongoing and right hand decreased fine motor control with tremors and was evaluated by Dr. Rexene [name] on 12/11/2019 for possible Parkinson's but was not felt this was Parkinson's related.  He does continue to use cane for ambulation and denies any recent falls.  He is questioning return to driving as he was advised no driving at discharge until further cleared.  Completed 3 weeks DAPT and continues on Plavix alone without bleeding or bruising.  Continues on atorvastatin 40 mg daily without myalgias.  Blood pressure today 118/84.  Glucose levels stable.  Endorses ongoing use of CPAP for OSA management.  No further concerns at this time.   Stroke admission 04/14/2020 Jordan [name] is a 81 y.o. male with history of  diabetes, stroke, hypertension, hyperlipidemia, permanent cardiac pacemaker  who presented on 04/14/2020 with R sided decreased sensation.  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  Previously on clopidogrel and recommended DAPT for 3 weeks then clopidogrel alone.  Presented in hypertensive urgency with BP 204/116 and recommended long-term BP goal normotensive range.  LDL 57 and recommended  continuation of atorvastatin 40 mg daily.  Uncontrolled DM with A1c 7.9.  Other stroke risk factors include prior stroke 01/2018 with multifocal punctate infarcts concerning for embolic pattern, former tobacco use, EtOH use, obesity, CAD, OSA on CPAP and permanent cardiac pacemaker.  Evaluated by therapies who recommended CIR for residual deficits of imbalance and safety affecting ADLs and mobility.  Discharged to CIR on 04/18/2020.  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 Small vessel disease. Sinus dz. ASPECTS 10.    CTA head & neck no LVO. Aortic atherosclerosis.  MRI  L thalamic infarct. Small vessel disease. Atrophy.  2D Echo EF 65-70%. No source of embolus. LA mildly dilated.  Pacemaker interrogation no afib LDL 57 -continued atorvastatin 40 mg daily HgbA1c 7.9 Lovenox 40 mg sq daily for VTE prophylaxis clopidogrel  75 mg daily prior to admission, now on aspirin 81 mg daily and clopidogrel 75 mg daily. Continue DAPT x 3 weeks then plavix alone   Therapy recommendations:  CIR Disposition:  CIR      ROS:   14 system review of systems performed and negative with exception of those listed in HPI  PMH:  Past Medical History:  Diagnosis Date   Arthritis    R shoulder, bone spur   Arthritis    "hands" (03/25/2016)   Chronic diastolic heart failure (HCC)    Chronic lower back pain    Coronary heart disease    Dr Pernell Dupre   H/O cardiovascular stress test    perhaps last one was 2009   Heart murmur    12/29/11 echo: mild MR, no AS, trivial TR   History of gout    Hyperlipidemia    Hypertension    saw Dr. Linard Millers last earl;y- 2014, cardiac cath. last done ?2009, blocks seen didn't require any intervention at that point.   Narcolepsy    [name] 04-11-97; Mean Latency 1.19min, SOREM 2   OSA on CPAP    NPSG 12-13-98 AHI 22.7   PONV (postoperative nausea and vomiting)    Presence of permanent cardiac pacemaker     Shortness of breath    with exertion    Stroke ([place])    Type II diabetes mellitus ([place])     PSH:  Past Surgical History:  Procedure Laterality Date   CARDIAC CATHETERIZATION  2009?   EP IMPLANTABLE DEVICE N/A 03/25/2016   Procedure: Pacemaker Implant - Dual Chamber;  Surgeon: Evans Lance, MD;  Location: [facility];  Service: Cardiovascular;  Laterality: N/A;   FRACTURE SURGERY     INSERT / REPLACE / REMOVE PACEMAKER  03/24/2016   LEFT AND RIGHT HEART CATHETERIZATION WITH CORONARY ANGIOGRAM N/A 06/05/2014   Procedure: LEFT AND RIGHT HEART CATHETERIZATION WITH CORONARY ANGIOGRAM;  Surgeon: Sinclair Grooms, MD;  Location: [facility];  Service: Cardiovascular;  Laterality: N/A;   NASAL FRACTURE SURGERY     SHOULDER ARTHROSCOPY WITH ROTATOR CUFF REPAIR AND SUBACROMIAL DECOMPRESSION Right 08/16/2013   Procedure: SHOULDER ARTHROSCOPY WITH ROTATOR CUFF REPAIR AND SUBACROMIAL DECOMPRESSION;  Surgeon: [name], MD;  Location: [facility];  Service: Orthopedics;  Laterality: Right;  Right shoulder arthroscopy rotator cuff repair, subacromial decompression.   TONSILLECTOMY      Social History:  Social History   Socioeconomic History   Marital status: Married    Spouse name: Jordan [name]   Number of children: Not on file   Years of education: Not on file   Highest education level: Not on file  Occupational History   Occupation: [employer]   Occupation: [place] [employer]  Tobacco Use   Smoking status: Former    Packs/day: 2.50    Years: 6.00    Pack years: 15.00    Types: Cigarettes    Quit date: 06/05/1967    Years since quitting: 54.5   Smokeless tobacco: Never  Vaping Use   Vaping Use: Never used  Substance and Sexual Activity   Alcohol use: Yes    Comment: rare   Drug use: No   Sexual activity: Not Currently  Other Topics Concern   Not on file  Social History Narrative   Lives with wife and son   Right handed   Drinks 1 cups caffeine daily   Social  Determinants of Health   Financial Resource Strain: Not on file  Food Insecurity: Not on file  Transportation Needs: Not on file  Physical Activity: Not on file  Stress: Not on file  Social Connections: Not on file  Intimate Partner Violence: Not on file    Family History:  Family History  Problem Relation Age of Onset   Sleep apnea Sister    Colon cancer Sister     Medications:   Current Outpatient Medications on File Prior to Visit  Medication Sig Dispense Refill   acetaminophen (TYLENOL) 500 MG tablet Take 1,000 mg by mouth 2 (two) times daily.     atorvastatin (LIPITOR) 40 MG tablet Take 0.5 tablets (20 mg total) by mouth daily. 30 tablet 0   carbidopa-levodopa (SINEMET) 25-100 MG tablet Take 1 tablet by mouth 3 (three) times daily. 90 tablet 5   Carbidopa-Levodopa ER (SINEMET CR) 25-100 MG tablet controlled release Take 1 tablet by mouth at bedtime. 30 tablet 5   cholecalciferol (VITAMIN D3) 25 MCG (1000 UNIT) tablet Take 1,000 Units by mouth daily.     clonazePAM (KLONOPIN) 0.5 MG tablet TAKE 1 TABLET BY MOUTH EVERY DAY AT BEDTIME AS NEEDED FOR SLEEP 30 tablet 5   clopidogrel (PLAVIX) 75 MG tablet Take 1 tablet (75 mg total) by mouth daily. 30 tablet 0   Coenzyme Q10 (CO Q-10 PO) Take 1 capsule by mouth daily.     lidocaine (LIDODERM) 5 % Lidoderm 5 % topical patch  APPLY 1 PATCH BY TOPICAL ROUTE ONCE DAILY (MAY WEAR UP TO 12HOURS.)     lisinopril (ZESTRIL) 10 MG tablet Take 1 tablet (10 mg total) by mouth daily. (Patient taking differently: Take 20 mg by mouth daily.) 30 tablet 0   Melatonin 10 MG TABS Take 10 mg by mouth at bedtime.     metFORMIN (GLUCOPHAGE) 500 MG tablet Take 500 mg by mouth. 4 tables once a day     metoprolol tartrate (LOPRESSOR) 25 MG tablet Take 1 tablet (25 mg total) by mouth 2 (two) times daily. 60 tablet 0   Multiple Vitamin (MULTIVITAMIN WITH MINERALS) TABS tablet Take 1 tablet by mouth daily.     naproxen sodium (ALEVE) 220 MG tablet Take 220 mg  by mouth daily as needed.     NITROSTAT 0.4 MG SL tablet PLACE 1 TABLET (0.4 MG TOTAL) UNDER THE TONGUE EVERY 5 (FIVE) MINUTES AS NEEDED FOR CHEST PAIN. 25 tablet 5   PRESCRIPTION MEDICATION See admin instructions. CPAP- At bedtime     sertraline (ZOLOFT) 25 MG tablet Take 1 tablet (25 mg total) by mouth daily. 30 tablet 0   No current facility-administered medications on file prior to visit.    Allergies:  No Known Allergies    OBJECTIVE:  Physical Exam  Vitals:   12/08/21 1348  BP: 112/75  Pulse: 68   There is no height or weight on file to calculate BMI. No results found.  General: Obese , very pleasant elderly Caucasian male, seated, in no evident distress Head: head normocephalic and atraumatic.   Neck: supple with no carotid or supraclavicular bruits Cardiovascular: regular rate and rhythm, no murmurs Musculoskeletal: limited cervical and b/l shoulder ROM Skin:  no rash/petichiae Vascular:  Normal pulses all extremities   Neurologic Exam Mental Status: Awake and fully alert. Fluent speech and language although monotone. Oriented to place and time. Recent and remote memory intact. Attention span, concentration and fund of knowledge appropriate. Mood and affect appropriate.  Diminished facial expression.  Glabellar tap is positive.  Cranial Nerves: Pupils equal, briskly reactive  to light. Extraocular movements full without nystagmus. Visual fields full to confrontation. Hearing intact. Facial sensation intact. Face, tongue, palate moves normally and symmetrically.  Motor: Normal bulk and tone. Normal strength in all tested extremity muscles except mild bilateral hip flexor weakness.  Diminished fine finger movements on the right and orbits left over right upper extremity. Sensory.: intact to touch , pinprick , position and vibratory sensation.  Coordination: Rapid alternating movements normal in all extremities except slightly decreased right hand. Finger-to-nose and  heel-to-shin performed accurately bilaterally.   B/l R>L UE postural tremor with mild R cogwheel rigidity. Mild R>L bradykinesia.   Gait and Station: Deferred - RW not present at visit Reflexes: 1+ and symmetric. Toes downgoing.        ASSESSMENT/PLAN: ELLISON RIETH is a 81 y.o. 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  Recent fall 10/29 with worsening chronic back pain and gait. S/p left acetabular fracture 06/23/2021 after a mechanical fall with worsening gait and increased need for assistance since return home from SNF rehab    -continued gait impairment with imbalance - likely multifactorial in setting of parkinsonian, hx of stroke, prior hip fracture, chronic low back pain with compression fracture, diabetic neuropathy and generalized deconditioning. Recommend obtaining MR cervical to rule out cervical etiology with chronic neck pain (over multiple years) gradually worsening with limited neck ROM and subjective weakness at times -highly recommend participating in PT for lower back and once completed, consider restarting therapy for imbalance and gait -continue Sinemet 25-100 three times daily and Sinemet CR 25-100mg  at night - would not recommend adjusting at this time as limited benefit after previously increasing leading to higher suspicion orthopedic issue largely contributing  -will check lab work - b12, vit D, mag, cal . Recent A1c and TSH WNL -Continue current stroke prevention measures - limited discussion due to time constraints -suspect SOB with talking likely from deconditioning - talking fluently during visit without evidence of SOB. Does have f/u with Dr. Annamaria [name] in 2 weeks - recommended further discussing at that time     Follow-up in 4 months with Jordan [name] or call earlier  if needed    CC:  [name], MD   I spent a prolonged >90 minutes of face-to-face and non-face-to-face time with patient and wife.  This included previsit chart review, lab review, study review, order entry, electronic health record documentation, patient and wife education and prolonged discussion regarding vascular parkinsonism and use of Sinemet, worsening gait and other concerns as noted above and possible contributing factors and answered all other multiple questions to patient and wife satisfaction  Frann Rider, AGNP-BC  [facility] [address] [place],  [address]  Phone [phone] Fax [phone] Note: This document was prepared with digital dictation and possible smart phrase technology. Any transcriptional errors that result from this process are unintentional.

## 2021-12-09 ENCOUNTER — Other Ambulatory Visit (HOSPITAL_COMMUNITY): Payer: Self-pay | Admitting: Interventional Radiology

## 2021-12-09 ENCOUNTER — Telehealth: Payer: Self-pay | Admitting: Adult Health

## 2021-12-09 ENCOUNTER — Telehealth: Payer: Self-pay

## 2021-12-09 DIAGNOSIS — S32020A Wedge compression fracture of second lumbar vertebra, initial encounter for closed fracture: Secondary | ICD-10-CM

## 2021-12-09 LAB — CALCIUM: Calcium: 9.6 mg/dL (ref 8.6–10.2)

## 2021-12-09 LAB — MAGNESIUM: Magnesium: 1.5 mg/dL — ABNORMAL LOW (ref 1.6–2.3)

## 2021-12-09 LAB — VITAMIN D 25 HYDROXY (VIT D DEFICIENCY, FRACTURES): Vit D, 25-Hydroxy: 30.1 ng/mL (ref 30.0–100.0)

## 2021-12-09 LAB — VITAMIN B12: Vitamin B-12: 695 pg/mL (ref 232–1245)

## 2021-12-09 NOTE — Telephone Encounter (Signed)
Contacted pt, spoke to wife per DPR. Informed her recent labs looked good except decreased magnesium level. Recommend follow up with PCP Dr. Kenton Kingfisher to further discuss treatment options if needed and ongoing routine monitoring. Advised to call the office back with questions as she had none at the time. She understood and was grateful.

## 2021-12-09 NOTE — Telephone Encounter (Signed)
-----   Message from Frann Rider, NP sent at 12/09/2021 12:25 PM EST ----- Please advise patient recent labs looked good except decreased magnesium level. Recommend follow up with PCP Dr. Kenton Kingfisher to further discuss treatment options if needed and ongoing routine monitoring. Thank you.

## 2021-12-09 NOTE — Telephone Encounter (Signed)
[employer] medicare order sent to GI, they will obtain the aut and reach out to the patient to schedule.

## 2021-12-10 NOTE — Telephone Encounter (Signed)
Patient has called back and canceled one of the appts. Nothing further needed at this time.  Next Appt With Pulmonology Baird Lyons, MD) 12/29/2021 at 11:30 AM

## 2021-12-11 DIAGNOSIS — R3129 Other microscopic hematuria: Secondary | ICD-10-CM | POA: Diagnosis not present

## 2021-12-11 DIAGNOSIS — N3941 Urge incontinence: Secondary | ICD-10-CM | POA: Diagnosis not present

## 2021-12-11 DIAGNOSIS — N281 Cyst of kidney, acquired: Secondary | ICD-10-CM | POA: Diagnosis not present

## 2021-12-11 DIAGNOSIS — N3281 Overactive bladder: Secondary | ICD-10-CM | POA: Diagnosis not present

## 2021-12-11 DIAGNOSIS — N200 Calculus of kidney: Secondary | ICD-10-CM | POA: Diagnosis not present

## 2021-12-14 ENCOUNTER — Ambulatory Visit (HOSPITAL_COMMUNITY)
Admission: RE | Admit: 2021-12-14 | Discharge: 2021-12-14 | Disposition: A | Discharge status: Home / Self Care | Payer: Medicare (Managed Care) | Source: Ambulatory Visit | Attending: Interventional Radiology | Admitting: Interventional Radiology

## 2021-12-14 ENCOUNTER — Other Ambulatory Visit: Payer: Self-pay

## 2021-12-14 DIAGNOSIS — S32020A Wedge compression fracture of second lumbar vertebra, initial encounter for closed fracture: Secondary | ICD-10-CM

## 2021-12-15 HISTORY — PX: IR RADIOLOGIST EVAL & MGMT: IMG5224

## 2021-12-16 NOTE — Progress Notes (Signed)
I agree with the above plan 

## 2021-12-17 ENCOUNTER — Other Ambulatory Visit (HOSPITAL_COMMUNITY): Payer: Self-pay | Admitting: Interventional Radiology

## 2021-12-17 ENCOUNTER — Telehealth: Payer: Self-pay | Admitting: Adult Health

## 2021-12-17 NOTE — Telephone Encounter (Signed)
I will reach out to patient

## 2021-12-17 NOTE — Telephone Encounter (Signed)
Contacted spouse back, had an extended conversation with her as they are wanting to requesting a visit to speak in dept about the hallucination pt is having. I began to ask her since his visit last week, has anything with the patient changed to onset these hallucinations (environment, medications, stress, diet, ect) due to notes stating "Also reports hallucinations during SNF rehab stay - this has since improved". Spouse began to get upset stating she is only doing what they were told, if anything is needed in between appointments to let us know and when she does, she get drilled at and questioned if she aware he just seen and has upcoming appointments and all, even by phone staff. I informed her that was not at all what I nor they were doing, i'm just trying to get more information, triggers or anything that would help [place] understand what has drastically changed since last week. She states Hallucinations has worsen since, they have always been there no one reported improvement so she doesn't know where we got that info from its not true. He is having Visual and auditory hallucinations more frequently, He is not violent when hallucinating just confused. She continued to be upset and began to complain how we are not a good resource to our patients, patients cant get in to be seen for weeks, how other offices are open on fridays and even Saturday mornings and we are not and we need to hire more providers/staff. I attempted to reassure her we have two new providers, we are open every other Friday and no saturdays are not an option. As well as having a wait list and cancellations daily, pts can be seen sooner with our mychart options. Nothing that was being said would suffice to her. She requested a direct call form our manager and stated she is not complaining about me but about our office operations/safety to our patients care. She would like a call from management/directions ASAP. I informed her I will relay the  message as well as speak with [name]/Dr Leonie Man regarding concerns and call ended Please advise.

## 2021-12-17 NOTE — Telephone Encounter (Signed)
At prior visits, there has been no discussion or mention regarding hallucinations. During SNF stay, hallucinations were reported on NH note 07/20/2021 and per NH note on 07/30/2021 "At [facility], he had episodes of hallucinations. Zoloft dosage was decreased from 50 mg daily to 25 mg daily. Hallucinations is now resolved."  This was discussed at follow up visit 11/14 as wife mentioned these hallucinations but now improved and no concerns (as documented in note).   We have since had 2 prolonged (>60 minute) visits on 09/14/2021 and 12/08/2021 with discussion around worsening gait and functioning. This was felt to be multifactorial in setting of parkinsonian, hx of stroke, prior hip fracture, chronic low back pain with compression fracture, diabetic neuropathy and generalized deconditioning especially at we attempted to increase Sinemet dosage (per wife's strong request) at visit on 11/14 and no noticeable benefit as discussed at follow-up visit on 2/7.   Per Marcille Blanco, CMA phone call, wife reports hallucinations have been persistent since nursing home stay in September and now worsening. Would highly encourage she schedule a f/u visit with PCP first (as likely can be seen sooner) to rule out other causes such as medication use, electrolyte abnormalities, infection, etc. as hallucinations can be caused from multiple different factors (especially as just seen 9 days ago and no mention of this). Can schedule a visit with Dr. Leonie Man to further discuss if indicated. Thank you.

## 2021-12-17 NOTE — Telephone Encounter (Signed)
Pt's wife is asking for a call to discuss a hallucination pt Is having

## 2021-12-18 ENCOUNTER — Emergency Department (HOSPITAL_COMMUNITY): Payer: Medicare (Managed Care)

## 2021-12-18 ENCOUNTER — Encounter (HOSPITAL_COMMUNITY): Payer: Self-pay | Admitting: Emergency Medicine

## 2021-12-18 ENCOUNTER — Inpatient Hospital Stay (HOSPITAL_COMMUNITY)
Admission: EM | Admit: 2021-12-18 | Discharge: 2021-12-23 | DRG: 125 | Disposition: A | Discharge status: Home Health | Payer: Medicare (Managed Care) | Attending: Internal Medicine | Admitting: Internal Medicine

## 2021-12-18 ENCOUNTER — Other Ambulatory Visit: Payer: Self-pay

## 2021-12-18 DIAGNOSIS — Z87891 Personal history of nicotine dependence: Secondary | ICD-10-CM

## 2021-12-18 DIAGNOSIS — G47419 Narcolepsy without cataplexy: Secondary | ICD-10-CM | POA: Diagnosis present

## 2021-12-18 DIAGNOSIS — E876 Hypokalemia: Secondary | ICD-10-CM | POA: Diagnosis present

## 2021-12-18 DIAGNOSIS — Z800 Family history of malignant neoplasm of digestive organs: Secondary | ICD-10-CM

## 2021-12-18 DIAGNOSIS — I517 Cardiomegaly: Secondary | ICD-10-CM | POA: Diagnosis not present

## 2021-12-18 DIAGNOSIS — M1990 Unspecified osteoarthritis, unspecified site: Secondary | ICD-10-CM | POA: Diagnosis present

## 2021-12-18 DIAGNOSIS — G20 Parkinson's disease: Secondary | ICD-10-CM | POA: Diagnosis present

## 2021-12-18 DIAGNOSIS — R0602 Shortness of breath: Secondary | ICD-10-CM | POA: Diagnosis not present

## 2021-12-18 DIAGNOSIS — R531 Weakness: Secondary | ICD-10-CM | POA: Diagnosis present

## 2021-12-18 DIAGNOSIS — Z20822 Contact with and (suspected) exposure to covid-19: Secondary | ICD-10-CM | POA: Diagnosis present

## 2021-12-18 DIAGNOSIS — R442 Other hallucinations: Secondary | ICD-10-CM | POA: Diagnosis not present

## 2021-12-18 DIAGNOSIS — E7849 Other hyperlipidemia: Secondary | ICD-10-CM | POA: Diagnosis not present

## 2021-12-18 DIAGNOSIS — Z79899 Other long term (current) drug therapy: Secondary | ICD-10-CM

## 2021-12-18 DIAGNOSIS — R441 Visual hallucinations: Secondary | ICD-10-CM | POA: Diagnosis not present

## 2021-12-18 DIAGNOSIS — M4856XG Collapsed vertebra, not elsewhere classified, lumbar region, subsequent encounter for fracture with delayed healing: Secondary | ICD-10-CM | POA: Diagnosis present

## 2021-12-18 DIAGNOSIS — I2510 Atherosclerotic heart disease of native coronary artery without angina pectoris: Secondary | ICD-10-CM | POA: Diagnosis present

## 2021-12-18 DIAGNOSIS — G4733 Obstructive sleep apnea (adult) (pediatric): Secondary | ICD-10-CM | POA: Diagnosis present

## 2021-12-18 DIAGNOSIS — Z7902 Long term (current) use of antithrombotics/antiplatelets: Secondary | ICD-10-CM | POA: Diagnosis not present

## 2021-12-18 DIAGNOSIS — I10 Essential (primary) hypertension: Secondary | ICD-10-CM | POA: Diagnosis present

## 2021-12-18 DIAGNOSIS — Z7984 Long term (current) use of oral hypoglycemic drugs: Secondary | ICD-10-CM

## 2021-12-18 DIAGNOSIS — W19XXXA Unspecified fall, initial encounter: Secondary | ICD-10-CM | POA: Diagnosis present

## 2021-12-18 DIAGNOSIS — E1151 Type 2 diabetes mellitus with diabetic peripheral angiopathy without gangrene: Secondary | ICD-10-CM | POA: Diagnosis not present

## 2021-12-18 DIAGNOSIS — F05 Delirium due to known physiological condition: Secondary | ICD-10-CM | POA: Diagnosis present

## 2021-12-18 DIAGNOSIS — I5032 Chronic diastolic (congestive) heart failure: Secondary | ICD-10-CM | POA: Diagnosis present

## 2021-12-18 DIAGNOSIS — T428X5A Adverse effect of antiparkinsonism drugs and other central muscle-tone depressants, initial encounter: Secondary | ICD-10-CM | POA: Diagnosis present

## 2021-12-18 DIAGNOSIS — I110 Hypertensive heart disease with heart failure: Secondary | ICD-10-CM | POA: Diagnosis present

## 2021-12-18 DIAGNOSIS — M109 Gout, unspecified: Secondary | ICD-10-CM | POA: Diagnosis present

## 2021-12-18 DIAGNOSIS — I452 Bifascicular block: Secondary | ICD-10-CM | POA: Diagnosis present

## 2021-12-18 DIAGNOSIS — R296 Repeated falls: Secondary | ICD-10-CM | POA: Diagnosis present

## 2021-12-18 DIAGNOSIS — E785 Hyperlipidemia, unspecified: Secondary | ICD-10-CM | POA: Diagnosis present

## 2021-12-18 DIAGNOSIS — G20A1 Parkinson's disease without dyskinesia, without mention of fluctuations: Secondary | ICD-10-CM | POA: Diagnosis present

## 2021-12-18 DIAGNOSIS — Z8673 Personal history of transient ischemic attack (TIA), and cerebral infarction without residual deficits: Secondary | ICD-10-CM

## 2021-12-18 DIAGNOSIS — Z950 Presence of cardiac pacemaker: Secondary | ICD-10-CM

## 2021-12-18 DIAGNOSIS — E1169 Type 2 diabetes mellitus with other specified complication: Secondary | ICD-10-CM | POA: Diagnosis present

## 2021-12-18 DIAGNOSIS — M2578 Osteophyte, vertebrae: Secondary | ICD-10-CM | POA: Diagnosis not present

## 2021-12-18 DIAGNOSIS — E119 Type 2 diabetes mellitus without complications: Secondary | ICD-10-CM | POA: Diagnosis present

## 2021-12-18 DIAGNOSIS — I6381 Other cerebral infarction due to occlusion or stenosis of small artery: Secondary | ICD-10-CM | POA: Diagnosis not present

## 2021-12-18 DIAGNOSIS — R443 Hallucinations, unspecified: Secondary | ICD-10-CM

## 2021-12-18 LAB — COMPREHENSIVE METABOLIC PANEL
ALT: 10 U/L (ref 0–44)
AST: 36 U/L (ref 15–41)
Albumin: 3.9 g/dL (ref 3.5–5.0)
Alkaline Phosphatase: 54 U/L (ref 38–126)
Anion gap: 10 (ref 5–15)
BUN: 28 mg/dL — ABNORMAL HIGH (ref 8–23)
CO2: 24 mmol/L (ref 22–32)
Calcium: 9.6 mg/dL (ref 8.9–10.3)
Chloride: 105 mmol/L (ref 98–111)
Creatinine, Ser: 1.1 mg/dL (ref 0.61–1.24)
GFR, Estimated: >60 mL/min (ref >60)
Glucose, Bld: 163 mg/dL — ABNORMAL HIGH (ref 70–99)
Potassium: 5.1 mmol/L (ref 3.5–5.1)
Sodium: 139 mmol/L (ref 135–145)
Total Bilirubin: 1.7 mg/dL — ABNORMAL HIGH (ref 0.3–1.2)
Total Protein: 7.2 g/dL (ref 6.5–8.1)

## 2021-12-18 LAB — URINALYSIS, ROUTINE W REFLEX MICROSCOPIC
Bacteria, UA: NONE SEEN
Bilirubin Urine: NEGATIVE
Glucose, UA: NEGATIVE mg/dL
Ketones, ur: 5 mg/dL — AB
Leukocytes,Ua: NEGATIVE
Nitrite: NEGATIVE
Protein, ur: NEGATIVE mg/dL
Specific Gravity, Urine: 1.026 (ref 1.005–1.030)
pH: 5 (ref 5.0–8.0)

## 2021-12-18 LAB — CBC WITH DIFFERENTIAL/PLATELET
Abs Immature Granulocytes: 0.06 10*3/uL (ref 0.00–0.07)
Basophils Absolute: 0.1 10*3/uL (ref 0.0–0.1)
Basophils Relative: 0 %
Eosinophils Absolute: 0.2 10*3/uL (ref 0.0–0.5)
Eosinophils Relative: 2 %
HCT: 43.1 % (ref 39.0–52.0)
Hemoglobin: 14.5 g/dL (ref 13.0–17.0)
Immature Granulocytes: 1 %
Lymphocytes Relative: 23 %
Lymphs Abs: 2.7 10*3/uL (ref 0.7–4.0)
MCH: 30.5 pg (ref 26.0–34.0)
MCHC: 33.6 g/dL (ref 30.0–36.0)
MCV: 90.7 fL (ref 80.0–100.0)
Monocytes Absolute: 1 10*3/uL (ref 0.1–1.0)
Monocytes Relative: 9 %
Neutro Abs: 7.4 10*3/uL (ref 1.7–7.7)
Neutrophils Relative %: 65 %
Platelets: 284 10*3/uL (ref 150–400)
RBC: 4.75 MIL/uL (ref 4.22–5.81)
RDW: 13.2 % (ref 11.5–15.5)
WBC: 11.4 10*3/uL — ABNORMAL HIGH (ref 4.0–10.5)
nRBC: 0 % (ref 0.0–0.2)

## 2021-12-18 LAB — RESP PANEL BY RT-PCR (FLU A&B, COVID) ARPGX2
Influenza A by PCR: NEGATIVE
Influenza B by PCR: NEGATIVE
SARS Coronavirus 2 by RT PCR: NEGATIVE

## 2021-12-18 MED ORDER — MELATONIN 5 MG PO TABS
5.0000 mg | ORAL_TABLET | Freq: Every day | ORAL | Status: DC
  Administered 2021-12-19 – 2021-12-22 (×4): 5 mg via ORAL
  Filled 2021-12-18 (×5): qty 1

## 2021-12-18 MED ORDER — CARBIDOPA-LEVODOPA ER 25-100 MG PO TBCR
1.0000 | EXTENDED_RELEASE_TABLET | Freq: Every day | ORAL | Status: DC
  Administered 2021-12-19 – 2021-12-22 (×4): 1 via ORAL
  Filled 2021-12-18 (×6): qty 1

## 2021-12-18 MED ORDER — METOPROLOL TARTRATE 25 MG PO TABS
25.0000 mg | ORAL_TABLET | Freq: Two times a day (BID) | ORAL | Status: DC
  Administered 2021-12-19 – 2021-12-23 (×9): 25 mg via ORAL
  Filled 2021-12-18 (×10): qty 1

## 2021-12-18 MED ORDER — ACETAMINOPHEN 650 MG RE SUPP: 650.0000 mg | Freq: Four times a day (QID) | RECTAL | Status: DC | PRN

## 2021-12-18 MED ORDER — LISINOPRIL 20 MG PO TABS
20.0000 mg | ORAL_TABLET | Freq: Every day | ORAL | Status: DC
  Administered 2021-12-19 – 2021-12-23 (×5): 20 mg via ORAL
  Filled 2021-12-18 (×5): qty 1

## 2021-12-18 MED ORDER — SODIUM CHLORIDE 0.9 % IV BOLUS
500.0000 mL | Freq: Once | INTRAVENOUS | Status: AC
  Administered 2021-12-18: 500 mL via INTRAVENOUS

## 2021-12-18 MED ORDER — CLOPIDOGREL BISULFATE 75 MG PO TABS
75.0000 mg | ORAL_TABLET | Freq: Every day | ORAL | Status: AC
  Administered 2021-12-19 – 2021-12-23 (×5): 75 mg via ORAL
  Filled 2021-12-18 (×5): qty 1

## 2021-12-18 MED ORDER — ENOXAPARIN SODIUM 40 MG/0.4ML IJ SOSY
40.0000 mg | PREFILLED_SYRINGE | INTRAMUSCULAR | Status: DC
  Administered 2021-12-19 – 2021-12-22 (×4): 40 mg via SUBCUTANEOUS
  Filled 2021-12-18 (×5): qty 0.4

## 2021-12-18 MED ORDER — INSULIN ASPART 100 UNIT/ML IJ SOLN
0.0000 [IU] | Freq: Three times a day (TID) | INTRAMUSCULAR | Status: DC
  Administered 2021-12-19: 2 [IU] via SUBCUTANEOUS
  Administered 2021-12-19 – 2021-12-20 (×3): 3 [IU] via SUBCUTANEOUS
  Administered 2021-12-21 (×3): 2 [IU] via SUBCUTANEOUS
  Administered 2021-12-22 (×2): 3 [IU] via SUBCUTANEOUS
  Administered 2021-12-22 – 2021-12-23 (×2): 2 [IU] via SUBCUTANEOUS
  Administered 2021-12-23: 3 [IU] via SUBCUTANEOUS

## 2021-12-18 MED ORDER — ATORVASTATIN CALCIUM 10 MG PO TABS
20.0000 mg | ORAL_TABLET | Freq: Every day | ORAL | Status: DC
  Administered 2021-12-19 – 2021-12-23 (×5): 20 mg via ORAL
  Filled 2021-12-18 (×5): qty 2

## 2021-12-18 MED ORDER — SODIUM CHLORIDE 0.9 % IV SOLN: INTRAVENOUS | Status: DC

## 2021-12-18 MED ORDER — ACETAMINOPHEN 325 MG PO TABS: 650.0000 mg | ORAL_TABLET | Freq: Four times a day (QID) | ORAL | Status: DC | PRN

## 2021-12-18 MED ORDER — NITROGLYCERIN 0.4 MG SL SUBL: 0.4000 mg | SUBLINGUAL_TABLET | SUBLINGUAL | Status: DC | PRN

## 2021-12-18 MED ORDER — CARBIDOPA-LEVODOPA 25-100 MG PO TABS
1.0000 | ORAL_TABLET | Freq: Three times a day (TID) | ORAL | Status: DC
  Administered 2021-12-19 – 2021-12-23 (×14): 1 via ORAL
  Filled 2021-12-18 (×14): qty 1

## 2021-12-18 NOTE — Assessment & Plan Note (Addendum)
Follow up with TOC for further disposition, he has been found not candidate for CIR.

## 2021-12-18 NOTE — Assessment & Plan Note (Addendum)
Continue blood pressure control with metoprolol and  lisinopril

## 2021-12-18 NOTE — Assessment & Plan Note (Addendum)
Plan to continue with atorvastatin.

## 2021-12-18 NOTE — ED Notes (Signed)
This RN attempted to administer medications for this patient. At first, pt wondered what the medication was for. This RN educated pt on the exact medications and what they are for. The pt then proceeded to look at the medication and state, "I don't know what you are giving me." This RN restated the meds and what they are for. When asked for the pt to take his meds, the pt responded, "Okay." This RN proceeded to hold up the medication cup to his mouth and pt remarked, "Just because I said I could take these doesn't mean I want to." This RN then proceeded to document pt refusal and waste the medications.   Pt also pulled out IV sometime during this exchange and no longer has access.

## 2021-12-18 NOTE — Assessment & Plan Note (Addendum)
Patient will continue with Sinemet and follow up with neurology as outpatient.

## 2021-12-18 NOTE — Assessment & Plan Note (Deleted)
81 year old with worsening hallucinations and now delusions that are paranoid in setting of parkinsons disease -observation -likely secondary to PD. On lowest dose sinemet. EdP discussed with neurology and already on lowest dose sinement. If this is stopped risk worsening tremors and falls, may need official consult  -UA pending, CXR with no acute finding and head CT with no acute finding, making infectious etiology less likely. UA has not been collected yet. Mildly elevated WBC at 11.4. likely reactive, but trend, f/u on UA and monitor fever curve -BUN elevated and dry. Received 500cc bolus in ED, will do gentle time limited IVF x 8 hours in setting of grade 1 diastolic dysfunction. Has poor water intake.  -will hold klonopin and zoloft as these can contribute to hallucinations -check TSH/UDS -psychiatry has been consulted, may benefit from seroquel.  -delirium precautions

## 2021-12-18 NOTE — NC FL2 (Signed)
[place] LEVEL OF CARE SCREENING TOOL     IDENTIFICATION  Patient Name: Jordan [name] Birthdate: 02-23-1941 Sex: male Admission Date (Current Location): 12/18/2021  [facility] and [place] Number:  Herbalist and Address:  [facility], [place] 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   Hallucinations, visual 07/30/2021   Acetabulum fracture, left ([place]) 06/25/2021   Acute hip pain, left 06/24/2021   Fall at home, initial encounter 06/24/2021   Leukocytosis 06/24/2021   Chronic diastolic heart failure ([place])    Sinusitis, acute maxillary 06/24/2020   AKI (acute kidney injury) ([place])    Labile blood glucose    Cough    Benign essential HTN    Skin lesion of hand    Hypoalbuminemia due to protein-calorie malnutrition ([place])    Controlled type 2 diabetes mellitus with hyperglycemia, without long-term current use of insulin ([place])    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   Coronary atherosclerosis 12/05/2007    Orientation RESPIRATION BLADDER Height & Weight     Self, Time, Situation  Normal Incontinent Weight:   Height:     BEHAVIORAL SYMPTOMS/MOOD NEUROLOGICAL BOWEL NUTRITION STATUS      Continent Diet (Regular)  AMBULATORY STATUS COMMUNICATION OF NEEDS Skin   Extensive Assist Verbally Normal                       Personal Care Assistance Level of Assistance  Bathing, Feeding, Dressing Bathing Assistance: Limited assistance Feeding assistance: Independent Dressing Assistance: Limited assistance     Functional Limitations Info  Sight, Hearing, Speech Sight Info: Impaired (Wears glasses) Hearing Info: Adequate Speech Info: Adequate    SPECIAL CARE FACTORS FREQUENCY  PT (By licensed PT), OT (By licensed OT)     PT Frequency: 5x weekly OT Frequency: 5 x weekly            Contractures Contractures Info: Not present    Additional Factors Info  Code Status Code Status Info: Full             Current Medications (12/18/2021):  This is the current hospital active medication list No current facility-administered medications for this encounter.   Current Outpatient Medications  Medication Sig Dispense Refill   acetaminophen (TYLENOL) 500 MG tablet Take 1,000 mg by mouth 2 (two) times daily.     atorvastatin (LIPITOR)  40 MG tablet Take 0.5 tablets (20 mg total) by mouth daily. 30 tablet 0   carbidopa-levodopa (SINEMET) 25-100 MG tablet Take 1 tablet by mouth 3 (three) times daily. 90 tablet 5   Carbidopa-Levodopa ER (SINEMET CR) 25-100 MG tablet controlled release Take 1 tablet by mouth at bedtime. 30 tablet 5   cholecalciferol (VITAMIN D3) 25 MCG (1000 UNIT) tablet Take 1,000 Units by mouth daily.     clonazePAM (KLONOPIN) 0.5 MG tablet TAKE 1 TABLET BY MOUTH EVERY  DAY AT BEDTIME AS NEEDED FOR SLEEP 30 tablet 5   clopidogrel (PLAVIX) 75 MG tablet Take 1 tablet (75 mg total) by mouth daily. 30 tablet 0   Coenzyme Q10 (CO Q-10 PO) Take 1 capsule by mouth daily.     lidocaine (LIDODERM) 5 % Lidoderm 5 % topical patch  APPLY 1 PATCH BY TOPICAL ROUTE ONCE DAILY (MAY WEAR UP TO 12HOURS.)     lisinopril (ZESTRIL) 10 MG tablet Take 1 tablet (10 mg total) by mouth daily. (Patient taking differently: Take 20 mg by mouth daily.) 30 tablet 0   Melatonin 10 MG TABS Take 10 mg by mouth at bedtime.     metFORMIN (GLUCOPHAGE) 500 MG tablet Take 500 mg by mouth. 4 tables once a day     metoprolol tartrate (LOPRESSOR) 25 MG tablet Take 1 tablet (25 mg total) by mouth 2 (two) times daily. 60 tablet 0   Multiple Vitamin (MULTIVITAMIN WITH MINERALS) TABS tablet Take 1 tablet by mouth daily.     naproxen sodium (ALEVE) 220 MG tablet Take 220 mg by mouth daily as needed.     NITROSTAT 0.4 MG SL tablet PLACE 1 TABLET (0.4 MG TOTAL) UNDER THE TONGUE EVERY 5 (FIVE) MINUTES AS NEEDED FOR CHEST PAIN. 25 tablet 5   PRESCRIPTION MEDICATION See admin instructions. CPAP- At bedtime     sertraline (ZOLOFT) 25 MG tablet Take 1 tablet (25 mg total) by mouth daily. 30 tablet 0     Discharge Medications: Please see discharge summary for a list of discharge medications.  Relevant Imaging Results:  Relevant Lab Results:   Additional Information SS# [id number] Covid vaccinated x 4  Kinser Fellman, LCSW

## 2021-12-18 NOTE — Assessment & Plan Note (Addendum)
a1c a year ago and over 7.9, uncontrolled T2DM. His glucose has been 175 mg/dl Plan to discontinue insulin therapy and will resume metformin.

## 2021-12-18 NOTE — H&P (Addendum)
History and Physical    Patient: Jordan [name] HWE:[id number] DOB: 1940/12/14 DOA: 12/18/2021 DOS: the patient was seen and examined on 12/18/2021 PCP: [name], MD  Patient coming from: home lives with his wife and son    Chief Complaint: fall and hallucinations   HPI: Jordan [name] is a 81 y.o. male with medical history significant of parkinsons disease, bradycardia s/p PPM, T2DM, OSA on cpap, hx of CVA in 06/9380, HTN, diastolic CHF who presented to ED with worsening hallucinations (has been going on since August) but now is having delusions with increased falls. He is followed outpatient by neurology and is on the lowest dose of sinemet. Unfortunately side effect of this is hallucinations. He is laying in bed and isn't quite sure why he is here. He tells me he fell twice yesterday. Was able to get up after the first fall and then [facility] was called for the second fall. He knows he is here for hallucinations. He thinks the people that love him are the reason he is here. He has no current VH, denies any AH/SI/HI, but tells me he wouldn't tell me if he did. His wife tells me she brought him here because they couldn't get him into the neurologist. She states he is not the same as he was yesterday. He was lucid yesterday and actually asked her to call the neurologist because he noticed his visual hallucinations were becoming more frequent, not frightening. His cognition also seems to be impaired. All through the night he was talking to someone. This morning he was so tired she could hardly get him awake. He also seemed much weaker this AM.   He denies any fever/chills, headaches/dizziness, chest pain or palpitations, cough, stomach pain, N/V/D, dysuria  or other urinary symptoms, leg swelling. He has not been drinking much water.   He had a hallucination while I was in room, he saw a bag of chips in the air. He tells me he knows this is a hallucination.   ER Course:  vitals: afebrile, bp:  130/78, HR 77, RR: 20, oxygen: 94% on RA Pertinent labs: wbc: 11.4, BUN: 28, glucose: 163,  CTH: no acute finding.  CXR: no acute finding.  In ED: given 500cc bolus, SW and PT consult and TRH asked to admit.    Review of Systems: As mentioned in the history of present illness. All other systems reviewed and are negative. Past Medical History:  Diagnosis Date   Arthritis    R shoulder, bone spur   Arthritis    "hands" (03/25/2016)   Chronic diastolic heart failure (HCC)    Chronic lower back pain    Coronary heart disease    Dr Pernell Dupre   H/O cardiovascular stress test    perhaps last one was 2009   Heart murmur    12/29/11 echo: mild MR, no AS, trivial TR   History of gout    Hyperlipidemia    Hypertension    saw Dr. Linard Millers last earl;y- 2014, cardiac cath. last done ?2009, blocks seen didn't require any intervention at that point.   Narcolepsy    [name] 04-11-97; Mean Latency 1.86min, SOREM 2   OSA on CPAP    NPSG 12-13-98 AHI 22.7   PONV (postoperative nausea and vomiting)    Presence of permanent cardiac pacemaker    Shortness of breath    with exertion    Stroke ([place])    Type II diabetes mellitus [facility])    Past Surgical  History:  Procedure Laterality Date   CARDIAC CATHETERIZATION  2009?   EP IMPLANTABLE DEVICE N/A 03/25/2016   Procedure: Pacemaker Implant - Dual Chamber;  Surgeon: Evans Lance, MD;  Location: [place] CV LAB;  Service: Cardiovascular;  Laterality: N/A;   FRACTURE SURGERY     INSERT / REPLACE / REMOVE PACEMAKER  03/24/2016   IR RADIOLOGIST EVAL & MGMT  12/15/2021   LEFT AND RIGHT HEART CATHETERIZATION WITH CORONARY ANGIOGRAM N/A 06/05/2014   Procedure: LEFT AND RIGHT HEART CATHETERIZATION WITH CORONARY ANGIOGRAM;  Surgeon: Sinclair Grooms, MD;  Location: [facility] CATH LAB;  Service: Cardiovascular;  Laterality: N/A;   NASAL FRACTURE SURGERY     SHOULDER ARTHROSCOPY WITH ROTATOR CUFF REPAIR AND SUBACROMIAL DECOMPRESSION Right 08/16/2013   Procedure:  SHOULDER ARTHROSCOPY WITH ROTATOR CUFF REPAIR AND SUBACROMIAL DECOMPRESSION;  Surgeon: [name], MD;  Location: [place];  Service: Orthopedics;  Laterality: Right;  Right shoulder arthroscopy rotator cuff repair, subacromial decompression.   TONSILLECTOMY     Social History:  reports that he quit smoking about 54 years ago. His smoking use included cigarettes. He has a 15.00 pack-year smoking history. He has never used smokeless tobacco. He reports current alcohol use. He reports that he does not use drugs.  No Known Allergies  Family History  Problem Relation Age of Onset   Sleep apnea Sister    Colon cancer Sister     Prior to Admission medications   Medication Sig Start Date End Date Taking? Authorizing Provider  acetaminophen (TYLENOL) 500 MG tablet Take 1,000 mg by mouth 2 (two) times daily.    [provider]  atorvastatin (LIPITOR) 40 MG tablet Take 0.5 tablets (20 mg total) by mouth daily. 07/30/21   Medina-Vargas, Monina C, NP  carbidopa-levodopa (SINEMET) 25-100 MG tablet Take 1 tablet by mouth 3 (three) times daily. 09/14/21   Frann Rider, NP  Carbidopa-Levodopa ER (SINEMET CR) 25-100 MG tablet controlled release Take 1 tablet by mouth at bedtime. 09/14/21   Frann Rider, NP  cholecalciferol (VITAMIN D3) 25 MCG (1000 UNIT) tablet Take 1,000 Units by mouth daily.    [provider]  clonazePAM (KLONOPIN) 0.5 MG tablet TAKE 1 TABLET BY MOUTH EVERY DAY AT BEDTIME AS NEEDED FOR SLEEP 09/30/21   Baird Lyons D, MD  clopidogrel (PLAVIX) 75 MG tablet Take 1 tablet (75 mg total) by mouth daily. 07/30/21   Medina-Vargas, Monina C, NP  Coenzyme Q10 (CO Q-10 PO) Take 1 capsule by mouth daily.    [provider]  lidocaine (LIDODERM) 5 % Lidoderm 5 % topical patch  APPLY 1 PATCH BY TOPICAL ROUTE ONCE DAILY (MAY WEAR UP TO 12HOURS.) 11/26/21   [provider]  lisinopril (ZESTRIL) 10 MG tablet Take 1 tablet (10 mg total) by mouth  daily. Patient taking differently: Take 20 mg by mouth daily. 07/30/21   Medina-Vargas, Monina C, NP  Melatonin 10 MG TABS Take 10 mg by mouth at bedtime.    [provider]  metFORMIN (GLUCOPHAGE) 500 MG tablet Take 500 mg by mouth. 4 tables once a day    [provider]  metoprolol tartrate (LOPRESSOR) 25 MG tablet Take 1 tablet (25 mg total) by mouth 2 (two) times daily. 07/30/21   Medina-Vargas, Monina C, NP  Multiple Vitamin (MULTIVITAMIN WITH MINERALS) TABS tablet Take 1 tablet by mouth daily. 02/19/18   Georgette Shell, MD  naproxen sodium (ALEVE) 220 MG tablet Take 220 mg by mouth daily as needed.  [provider]  NITROSTAT 0.4 MG SL tablet PLACE 1 TABLET (0.4 MG TOTAL) UNDER THE TONGUE EVERY 5 (FIVE) MINUTES AS NEEDED FOR CHEST PAIN. 03/31/16   Belva Crome, MD  PRESCRIPTION MEDICATION See admin instructions. CPAP- At bedtime    [provider]  sertraline (ZOLOFT) 25 MG tablet Take 1 tablet (25 mg total) by mouth daily. 07/30/21   Medina-Vargas, Jaymes Graff C, NP    Physical Exam: Vitals:   12/18/21 1430 12/18/21 1445 12/18/21 1600 12/18/21 1630  BP: 117/77 126/87 (!) 160/90 135/77  Pulse: 66 66 71 76  Resp: 16 10 15 18   Temp:      TempSrc:      SpO2: 94% 94% 94% 96%   General:  Appears calm and comfortable and is in NAD Eyes:  PERRL, EOMI, normal lids, iris ENT:  grossly normal hearing, lips & tongue, mmm; missing teeth  Neck:  no LAD, masses or thyromegaly; no carotid bruits Cardiovascular:  RRR, no m/r/g. No LE edema.  Respiratory:   CTA bilaterally with no wheezes/rales/rhonchi.  Normal respiratory effort. Abdomen:  soft, NT, ND, NABS Back:   normal alignment, no CVAT Skin:  no rash or induration seen on limited exam Musculoskeletal:  grossly normal tone BUE/BLE, good ROM, no bony abnormality Lower extremity:  No LE edema.  Limited foot exam with no ulcerations.  2+ distal pulses. Psychiatric:  grossly normal mood and affect, speech  fluent, affect flat.  Alert to self, not place, knows month and year. Denies any VH/AH/SI/HI at this time.  Neurologic:  CN 2-12 grossly intact, moves all extremities in coordinated fashion, sensation intact   Radiological Exams on Admission: Independently reviewed - see discussion in A/P where applicable  CT Head Wo Contrast  Result Date: 12/18/2021 CLINICAL DATA:  Neuro deficit, acute stroke suspected. EXAM: CT HEAD WITHOUT CONTRAST TECHNIQUE: Contiguous axial images were obtained from the base of the skull through the vertex without intravenous contrast. RADIATION DOSE REDUCTION: This exam was performed according to the departmental dose-optimization program which includes automated exposure control, adjustment of the mA and/or kV according to patient size and/or use of iterative reconstruction technique. COMPARISON:  CT head dated June 30, 2021 FINDINGS: Brain: No evidence of acute infarction, hemorrhage, hydrocephalus, extra-axial collection or mass lesion/mass effect. Moderate cerebral atrophy and chronic microvascular ischemic changes of the white matter, unchanged. Bilateral chronic thalamic lacunar infarcts. Vascular: No hyperdense vessel or unexpected calcification. Skull: Normal. Negative for fracture or focal lesion. Sinuses/Orbits: Partial opacification of the left maxillary sinus, complete opacification of the left frontal sinus and patchy opacification of ethmoid air cells. Other: None. IMPRESSION: 1.  No acute intracranial abnormality. 2. Cerebral atrophy, white matter ischemic changes and chronic lacunar infarcts of bilateral thalami. 3.  Chronic paranasal sinus disease. Electronically Signed   By: Keane Police D.O.   On: 12/18/2021 14:19   DG Chest Port 1 View  Result Date: 12/18/2021 CLINICAL DATA:  Shortness of breath. EXAM: PORTABLE CHEST 1 VIEW COMPARISON:  06/21/2020. FINDINGS: Trachea is midline. Heart is enlarged. Thoracic aorta is calcified. Pacemaker lead tips are in the  right atrium and right ventricle. Lungs are somewhat low in volume with minimal left basilar subsegmental volume loss. No pleural fluid. IMPRESSION: No acute findings. Electronically Signed   By: Lorin Picket M.D.   On: 12/18/2021 14:34    EKG: Independently reviewed.  Atrial paced with rate 66, RBBB ; nonspecific ST changes with no evidence of acute ischemia. Similar to previous ekg  Labs on Admission: I have personally reviewed the available labs and imaging studies at the time of the admission.  Pertinent labs:   wbc: 11.4,  BUN: 28,  glucose: 163,   Assessment and Plan: Hallucinations, visual- (present on admission) 81 year old with worsening hallucinations and now delusions that are paranoid in setting of parkinsons disease -observation -likely secondary to PD. On lowest dose sinemet. EdP discussed with neurology and already on lowest dose sinement. If this is stopped risk worsening tremors and falls, may need official consult  -UA pending, CXR with no acute finding and head CT with no acute finding, making infectious etiology less likely. UA has not been collected yet. Mildly elevated WBC at 11.4. likely reactive, but trend, f/u on UA and monitor fever curve -BUN elevated and dry. Received 500cc bolus in ED, will do gentle time limited IVF x 8 hours in setting of grade 1 diastolic dysfunction. Has poor water intake.  -will hold klonopin and zoloft as these can contribute to hallucinations -check TSH/UDS -psychiatry has been consulted, may benefit from seroquel.  -delirium precautions   Frequent falls -PT and SW has been consulted -has had increasing number of falls recently likely secondary to PD -SW consult for placement -fall precautions  Parkinson disease ([place]) Followed outpatient by neurology With psychosis with paranoia concerning for advancing disease, may need placement  Will continue low dose sinemet for now Neurology consult may be beneficial   Benign essential  HTN- (present on admission) Well controlled, continue lopressor and lisinopril   DM (diabetes mellitus), type 2 with peripheral vascular complications ([place])- (present on admission) Hold metformin a1c a year ago and over 7.9 SSI and accuchecks per protocol   Chronic diastolic heart failure ([place])- (present on admission) Appears euvolemic, no signs or symptoms of exacerbation  Echo 04/2020:  LVEF 65 to 70%, no evidence of acute focal wall motion abnormalities, mild concentric LVH, grade 2 diastolic dysfunction, mildly dilated left atrium, no evidence of significant valvular pathology. 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 Observation status is judged to be reasonable and necessary in order to provide the required intensity of service to ensure the patient's safety. The patient's presenting symptoms, physical exam findings, and initial radiographic and laboratory data in the context of their medical condition is felt to place them at decreased risk for further clinical deterioration. Furthermore, it is anticipated that the patient will be medically stable for discharge from the hospital within 2 midnights of admission.   Author: Orma Flaming, MD 12/18/2021 7:33 PM  For on call review [email or web address].

## 2021-12-18 NOTE — Social Work (Signed)
CSW met with Pt and family at bedside. CSW began SNF process. FL2 written but not signed as yet.  Pt was placed in obs status and bed requested while this CSW was working on SNF process.. Pt has been to [facility] and family would be amenable to that facility but family would prefer [facility] if possible.

## 2021-12-18 NOTE — Assessment & Plan Note (Addendum)
Continue with home Cpap.

## 2021-12-18 NOTE — ED Provider Notes (Signed)
°  Physical Exam  BP 135/77    Pulse 76    Temp 98.5 F (36.9 C) (Oral)    Resp 18    SpO2 96%   Physical Exam  Procedures  Procedures  ED Course / MDM    Medical Decision Making Care assumed at 3 PM.  Patient is here with hallucinations and falls.  This was thought to be secondary to Parkinson's disease.  Patient CT head and labs showed mild AKI.  Signout pending COVID test and reassessment  4 pm I discussed with the wife at bedside.  She is very concerned for his falls.  He fell at least twice over the last 2 days.  She felt that he is not safe to go home and will need rehab.  I consulted physical therapy and case manager  6:29 PM Physical therapy recommended rehab.  However, wife is again concerned about hallucinations.  I discussed case with Dr. Rory Percy from neurology.  He states that patient is on the lowest dose of Sinemet already and Sinemet can cause hallucinations.  However if he is taken off Sinemet he will have more tremors from Parkinson's.  He states that he has no particular recommendations at this time.  Given his unsteadiness and recurrent falls and hallucinations, at this point, patient will be admitted to the hospital service.  Consider psychiatry consult regarding hallucinations and delusions and medication management  Problems Addressed: Hallucinations: chronic illness or injury with exacerbation, progression, or side effects of treatment Weakness: chronic illness or injury with exacerbation, progression, or side effects of treatment  Amount and/or Complexity of Data Reviewed External Data Reviewed: radiology and notes. Labs: ordered. Decision-making details documented in ED Course. Radiology: ordered and independent interpretation performed. Decision-making details documented in ED Course.  Risk Decision regarding hospitalization.        Drenda Freeze, MD 12/18/21 [phone]

## 2021-12-18 NOTE — ED Provider Notes (Addendum)
[facility] EMERGENCY DEPARTMENT Provider Note   CSN: [id number] Arrival date & time: 12/18/21  1225     History  No chief complaint on file.   Jordan [name] is a 81 y.o. male.  81 year old male with prior medical history as detailed below presents for evaluation.  Patient arrives accompanied by his wife.  Patient with longstanding history of Parkinson's.  Patient with intermittent hallucinations -visual -since August.  Patient's wife reports increased visual hallucinations and decreased level of consciousness for the last 2 to 3 days.  Patient with mildly decreased p.o. intake over the last 2 to 3 days.  No reported fevers.  No nausea or vomiting.  No chest pain or shortness of breath.  The history is provided by the patient and medical records.  Illness Location:  Hallucinations Severity:  Moderate Onset quality:  Sudden Duration:  30 minutes Timing:  Intermittent Progression:  Worsening Chronicity:  New     Home Medications Prior to Admission medications   Medication Sig Start Date End Date Taking? Authorizing Provider  acetaminophen (TYLENOL) 500 MG tablet Take 1,000 mg by mouth 2 (two) times daily.    [provider]  atorvastatin (LIPITOR) 40 MG tablet Take 0.5 tablets (20 mg total) by mouth daily. 07/30/21   Medina-Vargas, Monina C, NP  carbidopa-levodopa (SINEMET) 25-100 MG tablet Take 1 tablet by mouth 3 (three) times daily. 09/14/21   Frann Rider, NP  Carbidopa-Levodopa ER (SINEMET CR) 25-100 MG tablet controlled release Take 1 tablet by mouth at bedtime. 09/14/21   Frann Rider, NP  cholecalciferol (VITAMIN D3) 25 MCG (1000 UNIT) tablet Take 1,000 Units by mouth daily.    [provider]  clonazePAM (KLONOPIN) 0.5 MG tablet TAKE 1 TABLET BY MOUTH EVERY DAY AT BEDTIME AS NEEDED FOR SLEEP 09/30/21   Baird Lyons D, MD  clopidogrel (PLAVIX) 75 MG tablet Take 1 tablet (75 mg total) by mouth daily. 07/30/21   Medina-Vargas,  Monina C, NP  Coenzyme Q10 (CO Q-10 PO) Take 1 capsule by mouth daily.    [provider]  lidocaine (LIDODERM) 5 % Lidoderm 5 % topical patch  APPLY 1 PATCH BY TOPICAL ROUTE ONCE DAILY (MAY WEAR UP TO 12HOURS.) 11/26/21   [provider]  lisinopril (ZESTRIL) 10 MG tablet Take 1 tablet (10 mg total) by mouth daily. Patient taking differently: Take 20 mg by mouth daily. 07/30/21   Medina-Vargas, Monina C, NP  Melatonin 10 MG TABS Take 10 mg by mouth at bedtime.    [provider]  metFORMIN (GLUCOPHAGE) 500 MG tablet Take 500 mg by mouth. 4 tables once a day    [provider]  metoprolol tartrate (LOPRESSOR) 25 MG tablet Take 1 tablet (25 mg total) by mouth 2 (two) times daily. 07/30/21   Medina-Vargas, Monina C, NP  Multiple Vitamin (MULTIVITAMIN WITH MINERALS) TABS tablet Take 1 tablet by mouth daily. 02/19/18   Georgette Shell, MD  naproxen sodium (ALEVE) 220 MG tablet Take 220 mg by mouth daily as needed.    [provider]  NITROSTAT 0.4 MG SL tablet PLACE 1 TABLET (0.4 MG TOTAL) UNDER THE TONGUE EVERY 5 (FIVE) MINUTES AS NEEDED FOR CHEST PAIN. 03/31/16   Belva Crome, MD  PRESCRIPTION MEDICATION See admin instructions. CPAP- At bedtime    [provider]  sertraline (ZOLOFT) 25 MG tablet Take 1 tablet (25 mg total) by mouth daily. 07/30/21   Medina-Vargas, Senaida Lange, NP      Allergies  Patient has no known allergies.    Review of Systems   Review of Systems  All other systems reviewed and are negative.  Physical Exam Updated Vital Signs BP 117/77    Pulse 66    Temp 98.5 F (36.9 C) (Oral)    Resp 16    SpO2 94%  Physical Exam Vitals and nursing note reviewed.  Constitutional:      General: He is not in acute distress.    Appearance: Normal appearance. He is well-developed.  HENT:     Head: Normocephalic and atraumatic.  Eyes:     Conjunctiva/sclera: Conjunctivae normal.     Pupils: Pupils are equal, round, and  reactive to light.  Cardiovascular:     Rate and Rhythm: Normal rate and regular rhythm.     Heart sounds: Normal heart sounds.  Pulmonary:     Effort: Pulmonary effort is normal. No respiratory distress.     Breath sounds: Normal breath sounds.  Abdominal:     General: There is no distension.     Palpations: Abdomen is soft.     Tenderness: There is no abdominal tenderness.  Musculoskeletal:        General: No deformity. Normal range of motion.     Cervical back: Normal range of motion and neck supple.  Skin:    General: Skin is warm and dry.  Neurological:     General: No focal deficit present.     Mental Status: He is alert and oriented to person, place, and time.    ED Results / Procedures / Treatments   Labs (all labs ordered are listed, but only abnormal results are displayed) Labs Reviewed  CBC WITH DIFFERENTIAL/PLATELET - Abnormal; Notable for the following components:      Result Value   WBC 11.4 (*)    All other components within normal limits  COMPREHENSIVE METABOLIC PANEL - Abnormal; Notable for the following components:   Glucose, Bld 163 (*)    BUN 28 (*)    Total Bilirubin 1.7 (*)    All other components within normal limits  RESP PANEL BY RT-PCR (FLU A&B, COVID) [id number]  URINALYSIS, ROUTINE W REFLEX MICROSCOPIC    EKG  EKG Interpretation  Date/Time:  Friday December 18 2021 12:36:32 EST Ventricular Rate:  66 PR Interval:  252 QRS Duration: 138 QT Interval:  418 QTC Calculation: 438 R Axis:   -53 Text Interpretation: Atrial-paced rhythm with prolonged AV conduction Right bundle branch block Left anterior fascicular block Bifascicular block Minimal voltage criteria for LVH, may be normal variant ( R in aVL ) Septal infarct , age undetermined Possible Lateral infarct , age undetermined Abnormal ECG When compared with ECG of 24-Jun-2021 08:02, PREVIOUS ECG IS PRESENT Confirmed by Dene Gentry [phone]) on 12/18/2021 1:50:20 PM   Radiology CT Head Wo  Contrast  Result Date: 12/18/2021 CLINICAL DATA:  Neuro deficit, acute stroke suspected. EXAM: CT HEAD WITHOUT CONTRAST TECHNIQUE: Contiguous axial images were obtained from the base of the skull through the vertex without intravenous contrast. RADIATION DOSE REDUCTION: This exam was performed according to the departmental dose-optimization program which includes automated exposure control, adjustment of the mA and/or kV according to patient size and/or use of iterative reconstruction technique. COMPARISON:  CT head dated June 30, 2021 FINDINGS: Brain: No evidence of acute infarction, hemorrhage, hydrocephalus, extra-axial collection or mass lesion/mass effect. Moderate cerebral atrophy and chronic microvascular ischemic changes of the white matter, unchanged. Bilateral chronic thalamic lacunar infarcts. Vascular: No hyperdense vessel or  unexpected calcification. Skull: Normal. Negative for fracture or focal lesion. Sinuses/Orbits: Partial opacification of the left maxillary sinus, complete opacification of the left frontal sinus and patchy opacification of ethmoid air cells. Other: None. IMPRESSION: 1.  No acute intracranial abnormality. 2. Cerebral atrophy, white matter ischemic changes and chronic lacunar infarcts of bilateral thalami. 3.  Chronic paranasal sinus disease. Electronically Signed   By: Keane Police D.O.   On: 12/18/2021 14:19   DG Chest Port 1 View  Result Date: 12/18/2021 CLINICAL DATA:  Shortness of breath. EXAM: PORTABLE CHEST 1 VIEW COMPARISON:  06/21/2020. FINDINGS: Trachea is midline. Heart is enlarged. Thoracic aorta is calcified. Pacemaker lead tips are in the right atrium and right ventricle. Lungs are somewhat low in volume with minimal left basilar subsegmental volume loss. No pleural fluid. IMPRESSION: No acute findings. Electronically Signed   By: Lorin Picket M.D.   On: 12/18/2021 14:34    Procedures Procedures    Medications Ordered in ED Medications - No data to  display  ED Course/ Medical Decision Making/ A&P                           Medical Decision Making Amount and/or Complexity of Data Reviewed Radiology: ordered.  Risk Decision regarding hospitalization.    Medical Screen Complete  This patient presented to the ED with complaint of hallucinations.  This complaint involves an extensive number of treatment options. The initial differential diagnosis includes, but is not limited to, infection, metabolic abnormality, intracranial pathology, etc  This presentation is: Acute, Chronic, Self-Limited, Previously Undiagnosed, Uncertain Prognosis, Complicated, Systemic Symptoms, and Threat to Life/Bodily Function  Patient with advanced Parkinson disease presents with complaint of worsening hallucinations.  Patient with longstanding history of Parkinson's.  Patient is known to [facility] neurology for same.  Patient with intermittent visual hallucinations per report since August 2022.  Patient is accompanied by his wife who provides majority of history.  Screening labs show mildly elevated BUN.  Patient's white count is 11.4  Pending UA and COVID flu testing signed over to Dr.Yao.  Pending reevaluation and disposition decision.   Co morbidities that complicated the patient's evaluation  Advanced age, advanced Parkinson's   Additional history obtained:  Additional history obtained from Spouse External records from outside sources obtained and reviewed including prior ED visits and prior Inpatient records.    Lab Tests:  I ordered and personally interpreted labs.  The pertinent results include: CBC, CMP, UA, COVID, flu   Imaging Studies ordered:  I ordered imaging studies including CT head, chest x-ray I independently visualized and interpreted obtained imaging which showed NAD I agree with the radiologist interpretation.   Cardiac Monitoring:  The patient was maintained on a cardiac monitor.  I personally viewed and  interpreted the cardiac monitor which showed an underlying rhythm of: NSR   Medicines ordered:  I ordered medication including IV fluids for possible dehydration Reevaluation of the patient after these medicines showed that the patient: improved    Problem List / ED Course:  AMS, hallucinations   Reevaluation:  After the interventions noted above, I reevaluated the patient and found that they have: stayed the same    Disposition:  After consideration of the diagnostic results and the patients response to treatment, I feel that the patent would benefit from likely admission.          Final Clinical Impression(s) / ED Diagnoses Final diagnoses:  Weakness  Hallucinations  Rx / DC Orders ED Discharge Orders     None         Valarie Merino, MD 12/19/21 [id number]    Valarie Merino, MD 01/02/22 [phone]

## 2021-12-18 NOTE — ED Triage Notes (Signed)
Pt here from home with c/o hallucinations seeing people in his house is an ongoing problem but has got worse recently , pt alert to time and self

## 2021-12-18 NOTE — ED Provider Triage Note (Signed)
Emergency Medicine Provider Triage Evaluation Note  Jordan [name] , a 81 y.o. male  was evaluated in triage.  Pt complains of worsening hallucinations.  Wife who is present in the room states that he has been having hallucinations since August, diagnosis of Parkinson's followed by neurology.  States that in the last 24 hours he has been hallucinating more than normal with visual and auditory hallucinations.  States that he is now borderline delusional as well.  Wife called neurology who recommended he come here for further evaluation as they could not see him in the near future.  Patient has no complaints.  Review of Systems  Positive: Hallucinations Negative: Fever, chills  Physical Exam  BP 114/80 (BP Location: [name] Arm)    Pulse 64    Temp 98.7 F (37.1 C) (Oral)    Resp 16    SpO2 96%  Gen:   Awake, no distress   Resp:  Normal effort  MSK:   Moves extremities without difficulty  Other:    Medical Decision Making  Medically screening exam initiated at 1:17 PM.  Appropriate orders placed.  Jordan [name] was informed that the remainder of the evaluation will be completed by another provider, this initial triage assessment does not replace that evaluation, and the importance of remaining in the ED until their evaluation is complete.     Jordan Face, PA-C 12/18/21 1322

## 2021-12-18 NOTE — Assessment & Plan Note (Addendum)
Continue blood pressure control and antiplatelet therapy with aspirin and clopidogrel.

## 2021-12-18 NOTE — Assessment & Plan Note (Addendum)
Echo 04/2020:  LVEF 65 to 70%, no evidence of acute focal wall motion abnormalities, mild concentric LVH, grade 2 diastolic dysfunction, mildly dilated left atrium, no evidence of significant valvular pathology.  No clinical signs of exacerbation, continue blood pressure monitoring.

## 2021-12-18 NOTE — Evaluation (Signed)
Physical Therapy Evaluation Patient Details Name: Jordan [name] MRN: [id number] DOB: [birth year] Today's Date: 12/18/2021  History of Present Illness  Pt is an 81 y/o male presenting to the ED secondary to increased confusion and hallucinations. PMH includes parkinson's disease and unspecified compression fx.  Clinical Impression  Pt admitted secondary to problem above with deficits below. Pt requiring mod to max A for bed mobility tasks and to come to long sitting. Unsafe to attempt further mobility on stretcher with +1 assist. Pt's wife reports pt has had increased difficulty with mobility tasks at home and she has been providing increased assist. Recommending SNF level therapies to increase independence and safety. Will continue to follow acutely.        Recommendations for follow up therapy are one component of a multi-disciplinary discharge planning process, led by the attending physician.  Recommendations may be updated based on patient status, additional functional criteria and insurance authorization.  Follow Up Recommendations Skilled nursing-short term rehab (<3 hours/day)    Assistance Recommended at Discharge Frequent or constant Supervision/Assistance  Patient can return home with the following  A lot of help with walking and/or transfers;A lot of help with bathing/dressing/bathroom;Assistance with cooking/housework;Direct supervision/assist for medications management;Assist for transportation;Direct supervision/assist for financial management;Help with stairs or ramp for entrance    Equipment Recommendations Wheelchair cushion (measurements PT);Wheelchair (measurements PT);Hospital bed  Recommendations for Other Services       Functional Status Assessment Patient has had a recent decline in their functional status and demonstrates the ability to make significant improvements in function in a reasonable and predictable amount of time.     Precautions / Restrictions  Precautions Precautions: Fall Precaution Comments: Reports multiple falls at home Restrictions Weight Bearing Restrictions: No      Mobility  Bed Mobility Overal bed mobility: Needs Assistance Bed Mobility: Rolling, Supine to Sit Rolling: Mod assist, Max assist   Supine to sit: Mod assist     General bed mobility comments: Mod to max A to roll from side to side. Mod A to come to long sitting on stretcher, however, pt with some increase in pain.    Transfers                   General transfer comment: Unsafe to attempt with +1 assist.    Ambulation/Gait                  Stairs            Wheelchair Mobility    Modified Rankin (Stroke Patients Only)       Balance                                             Pertinent Vitals/Pain Pain Assessment Pain Assessment: Faces Faces Pain Scale: Hurts even more Pain Location: back Pain Descriptors / Indicators: Grimacing, Guarding Pain Intervention(s): Monitored during session, Limited activity within patient's tolerance    Home Living Family/patient expects to be discharged to:: Skilled nursing facility                        Prior Function Prior Level of Function : Needs assist             Mobility Comments: Wife has had to provide more physical assist here lately with mobility ADLs Comments: Wife providing assist  Hand Dominance        Extremity/Trunk Assessment   Upper Extremity Assessment Upper Extremity Assessment: Generalized weakness    Lower Extremity Assessment Lower Extremity Assessment: Generalized weakness    Cervical / Trunk Assessment Cervical / Trunk Assessment: Other exceptions Cervical / Trunk Exceptions: wife reports hx of compression fx  Communication   Communication: Expressive difficulties  Cognition Arousal/Alertness: Awake/alert Behavior During Therapy: Flat affect Overall Cognitive Status: Impaired/Different from  baseline                                 General Comments: Pt with hallucinations and slow processing        General Comments General comments (skin integrity, edema, etc.): Pt's wife present and reports she is having increased difficulty caring for him    Exercises     Assessment/Plan    PT Assessment Patient needs continued PT services  PT Problem List Decreased strength;Decreased activity tolerance;Decreased mobility;Decreased balance;Decreased cognition;Decreased knowledge of use of DME;Decreased safety awareness;Decreased knowledge of precautions;Pain       PT Treatment Interventions DME instruction;Gait training;Functional mobility training;Therapeutic activities;Therapeutic exercise;Balance training;Patient/family education;Cognitive remediation    PT Goals (Current goals can be found in the Care Plan section)  Acute Rehab PT Goals Patient Stated Goal: for pt to get stronger PT Goal Formulation: With patient/family Time For Goal Achievement: 01/01/22 Potential to Achieve Goals: Fair    Frequency Min 2X/week     Co-evaluation               AM-PAC PT "6 Clicks" Mobility  Outcome Measure Help needed turning from your back to your side while in a flat bed without using bedrails?: A Lot Help needed moving from lying on your back to sitting on the side of a flat bed without using bedrails?: A Lot Help needed moving to and from a bed to a chair (including a wheelchair)?: Total Help needed standing up from a chair using your arms (e.g., wheelchair or bedside chair)?: Total Help needed to walk in hospital room?: Total Help needed climbing 3-5 steps with a railing? : Total 6 Click Score: 8    End of Session   Activity Tolerance: Patient tolerated treatment well Patient left: in bed;with call bell/phone within reach;with family/visitor present (on stretcher in ED) Nurse Communication: Mobility status PT Visit Diagnosis: Other abnormalities of gait and  mobility (R26.89);History of falling (Z91.81);Difficulty in walking, not elsewhere classified (R26.2)    Time: 9794-8016 PT Time Calculation (min) (ACUTE ONLY): 28 min   Charges:   PT Evaluation $PT Eval Moderate Complexity: 1 Mod PT Treatments $Therapeutic Activity: 8-22 mins        Jordan [name], DPT  Acute Rehabilitation Services  Pager: [phone] Office: [phone]   Jordan [name] 12/18/2021, 5:06 PM

## 2021-12-18 NOTE — Progress Notes (Signed)
Pt awake and alert. Pt placed pt cpap, pressure of 9. No resp distress noted. Will continue to monitor.

## 2021-12-19 DIAGNOSIS — G20 Parkinson's disease: Secondary | ICD-10-CM | POA: Diagnosis not present

## 2021-12-19 DIAGNOSIS — R441 Visual hallucinations: Secondary | ICD-10-CM | POA: Diagnosis not present

## 2021-12-19 LAB — BASIC METABOLIC PANEL
Anion gap: 11 (ref 5–15)
BUN: 23 mg/dL (ref 8–23)
CO2: 23 mmol/L (ref 22–32)
Calcium: 9.2 mg/dL (ref 8.9–10.3)
Chloride: 106 mmol/L (ref 98–111)
Creatinine, Ser: 0.89 mg/dL (ref 0.61–1.24)
GFR, Estimated: >60 mL/min (ref >60)
Glucose, Bld: 104 mg/dL — ABNORMAL HIGH (ref 70–99)
Potassium: 3.6 mmol/L (ref 3.5–5.1)
Sodium: 140 mmol/L (ref 135–145)

## 2021-12-19 LAB — CBC
HCT: 38.9 % — ABNORMAL LOW (ref 39.0–52.0)
Hemoglobin: 13 g/dL (ref 13.0–17.0)
MCH: 29.5 pg (ref 26.0–34.0)
MCHC: 33.4 g/dL (ref 30.0–36.0)
MCV: 88.4 fL (ref 80.0–100.0)
Platelets: 253 10*3/uL (ref 150–400)
RBC: 4.4 MIL/uL (ref 4.22–5.81)
RDW: 13.1 % (ref 11.5–15.5)
WBC: 11 10*3/uL — ABNORMAL HIGH (ref 4.0–10.5)
nRBC: 0 % (ref 0.0–0.2)

## 2021-12-19 LAB — GLUCOSE, CAPILLARY
Glucose-Capillary: 110 mg/dL — ABNORMAL HIGH (ref 70–99)
Glucose-Capillary: 137 mg/dL — ABNORMAL HIGH (ref 70–99)
Glucose-Capillary: 196 mg/dL — ABNORMAL HIGH (ref 70–99)
Glucose-Capillary: 143 mg/dL — ABNORMAL HIGH (ref 70–99)

## 2021-12-19 LAB — TSH: TSH: 1.03 u[IU]/mL (ref 0.350–4.500)

## 2021-12-19 MED ORDER — HYDRALAZINE HCL 20 MG/ML IJ SOLN
10.0000 mg | INTRAMUSCULAR | Status: DC | PRN
  Administered 2021-12-19: 10 mg via INTRAVENOUS
  Filled 2021-12-19: qty 1

## 2021-12-19 MED ORDER — HALOPERIDOL LACTATE 5 MG/ML IJ SOLN
2.0000 mg | Freq: Four times a day (QID) | INTRAMUSCULAR | Status: DC | PRN
  Administered 2021-12-19: 2 mg via INTRAVENOUS
  Filled 2021-12-19: qty 1

## 2021-12-19 MED ORDER — QUETIAPINE FUMARATE 25 MG PO TABS
12.5000 mg | ORAL_TABLET | Freq: Every day | ORAL | Status: DC
  Administered 2021-12-19 – 2021-12-22 (×4): 12.5 mg via ORAL
  Filled 2021-12-19 (×4): qty 1

## 2021-12-19 NOTE — Progress Notes (Signed)
Jordan [name]  XTK:[id number] DOB: [birth year] DOA: 12/18/2021 PCP: [name], MD    Brief Narrative:  81 year old with a history of Parkinson's disease, bradycardia status post pacer, DM2, OSA on CPAP, CVA 2021, HTN, and diastolic CHF who presented to the ED with the acute worsening of hallucinations which she had been experiencing since August 2022.  He is followed by neurology in the outpatient setting and it was felt that his hallucinations were due to his Sinemet.  In the ED CT head was without acute finding.  Consultants:  Psychiatry  Code Status: FULL CODE  Antimicrobials:  None  DVT prophylaxis: Lovenox  Interim Hx: Some paranoia and agitation was noted overnight.  Afebrile.  Vital signs stable.  Saturation 95% room air.  Complains of ongoing low back pain related to known L4 compression fracture for which patient is being considered for kyphoplasty.  Denies shortness of breath or chest pain.  I had a prolonged discussion at the bedside with the patient and his wife.  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 Felt to be due to Sinemet -ruled out sources of acute infection -gently hydrating -holding Klonopin and Zoloft -TSH normal - begin trial of seroquel as suggested by Psychiatry   Frequent falls Due to above plus Parkinson disease - PT/OT  Parkinson's disease Followed by neurology in the outpatient setting -on very low-dose Sinemet - family requests Neuro consult to consider options other than Sinemet   Essential HTN Well-controlled at present  DM2 CBG reasonably controlled  Chronic diastolic CHF No clinical evidence of acute exacerbation  HLD Continue usual Lipitor dose  OSA on CPAP Continue usual CPAP regimen  History of CVA Continue usual Plavix and statin    Family Communication: Spoke with patient and wife at bedside at length Disposition: From home -May require rehab depending upon PT/OT  eval  Objective: Blood pressure (!) 143/79, pulse 66, temperature 98.2 F (36.8 C), resp. rate 18, height 5\' 8"  (1.727 m), weight 94.7 kg, SpO2 95 %.  Intake/Output Summary (Last 24 hours) at 12/19/2021 1024 Last data filed at 12/19/2021 0915 Gross per 24 hour  Intake 468.36 ml  Output 300 ml  Net 168.36 ml   Filed Weights   12/19/21 0146  Weight: 94.7 kg    Examination: General: No acute respiratory distress Lungs: Clear to auscultation bilaterally without wheezes or crackles Cardiovascular: Regular rate and rhythm without murmur gallop or rub normal S1 and S2 Abdomen: Nontender, nondistended, soft, bowel sounds positive, no rebound, no ascites, no appreciable mass Extremities: No significant cyanosis, clubbing, or edema bilateral lower extremities  CBC: Recent Labs  Lab 12/18/21 1318 12/19/21 0440  WBC 11.4* 11.0*  NEUTROABS 7.4  --   HGB 14.5 13.0  HCT 43.1 38.9*  MCV 90.7 88.4  PLT 284 992   Basic Metabolic Panel: Recent Labs  Lab 12/18/21 1318 12/19/21 0440  NA 139 140  K 5.1 3.6  CL 105 106  CO2 24 23  GLUCOSE 163* 104*  BUN 28* 23  CREATININE 1.10 0.89  CALCIUM 9.6 9.2   GFR: Estimated Creatinine Clearance: 72.6 mL/min (by C-G formula based on SCr of 0.89 mg/dL).  Liver Function Tests: Recent Labs  Lab 12/18/21 1318  AST 36  ALT 10  ALKPHOS 54  BILITOT 1.7*  PROT 7.2  ALBUMIN 3.9    HbA1C: Hgb A1c MFr Bld  Date/Time Value Ref Range Status  04/14/2020 12:38 PM 7.9 (H) 4.8 - 5.6 % Final  Comment:    (NOTE) Pre diabetes:          5.7%-6.4%  Diabetes:              >6.4%  Glycemic control for   <7.0% adults with diabetes   02/17/2018 02:32 AM 8.6 (H) 4.8 - 5.6 % Final    Comment:    (NOTE) Pre diabetes:          5.7%-6.4% Diabetes:              >6.4% Glycemic control for   <7.0% adults with diabetes     CBG: Recent Labs  Lab 12/19/21 0611  GLUCAP 110*    Scheduled Meds:  atorvastatin  20 mg Oral Daily    carbidopa-levodopa  1 tablet Oral TID WC   Carbidopa-Levodopa ER  1 tablet Oral QHS   clopidogrel  75 mg Oral Daily   enoxaparin (LOVENOX) injection  40 mg Subcutaneous Q24H   insulin aspart  0-15 Units Subcutaneous TID WC   lisinopril  20 mg Oral Daily   melatonin  5 mg Oral QHS   metoprolol tartrate  25 mg Oral BID     LOS: 0 days   Cherene Altes, MD [facility] Office  [phone] Pager - Text Page per Shea Evans  If 7PM-7AM, please contact night-coverage per Amion 12/19/2021, 10:24 AM

## 2021-12-19 NOTE — Plan of Care (Signed)
  Problem: Elimination: Goal: Will not experience complications related to urinary retention Outcome: Progressing   Problem: Safety: Goal: Ability to remain free from injury will improve Outcome: Progressing   

## 2021-12-19 NOTE — Progress Notes (Signed)
Pt wife at bedside and endorses she wants to stay the night. Pillow and blankets provided. Pt currently pleasant and A+Ox3 (self, place, time) but endorses he is in the hospital 2/2 back pain issues.

## 2021-12-19 NOTE — Consult Note (Signed)
[facility] New Face-to-Face Psychiatric Evaluation   Service Date: December 19, 2021 LOS:  LOS: 0 days    Assessment  Jordan [name] is a 81 y.o. male admitted medically for 12/18/2021 12:31 PM for hallucinations. He carries no formal psychiatric diagnoses and has a past medical history most significant for Parkinson's disease. Psychiatry was consulted for hallucinations by Jordan Flaming, MD.    His current presentation of hallucinations gradually worsening from vivid dreams over the past several years is most consistent with known diagnosis of Parkinson's disease; medication of Sinemet also likely contributes to these hallucinations.  Had an extensive discussion on natural course of illness with patient and wife at bedside and risk/benefit/side effect discussion of quetiapine.  They are agreeable to trial of this medication at a low dose in the hospital.  Will trial quetiapine 12.5 mg tonight and reassess tomorrow.  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  ## Medications:  -- Start quetiapine 12.5 mg nightly.   ## Medical Decision Making Capacity:  Not formally assessed; engaged in risk/benefit discussion of medication order above although often deferred to wife.  ## Further Work-up:  -- Consider B12, folate. --Appreciate head imaging with no acute findings.   -- most recent EKG on  2/17 had QtC of 438 -- Pertinent labwork reviewed earlier this admission includes: TSH.  ## Disposition:  -- Per primary team  ## Behavioral / Environmental:  -- - RN to open blinds every AM. - To Bedside: Glasses, hearing aide, and pt's own shoes. Make available to patients. when possible and encourage use. - Encourage po fluids when appropriate, keep fluids within reach. - OOB to chair with meals. - Passive ROM exercises to all extremities with AM & PM care. - RN to assess orientation to person, time and place QAM and PRN. - Recommend extended visitation hours with familiar family/friends as feasible. - Staff to minimize disturbances at night. Turn off television when pt asleep or when not in use. -- explain procedures to patient  ##Legal Status   Thank you for this consult request. Recommendations have been communicated to the primary team.  We will continue to follow at this time.   [place] A Jordan [name]   NEW history  Relevant Aspects of Hospital Course:  Admitted on 12/18/2021 for hallucinations.  Patient Report:  Patient is seen with wife at bedside, who he consents to remain in the room throughout evaluation.  She provides some of the history below.  ROS:  Patient states that he is doing "better than I deserve"; proceeds to make several self-deprecating jokes throughout evaluation and frequently uses humor as defense mechanism.  Confirms history in chart-diagnosed with Parkinson's after a stroke about 2 years ago.    Feels his movement symptoms have been roughly stable over the last 18 months and his visuospatial issues are about the same.  Has  been having more frequent hallucinations over that time.  Did have vivid dreams interestingly enough for several years leading to formal diagnosis of Parkinson's disease.  Patient states his memory is "spotty"; symptoms of losing train of thought and word finding difficulty without long-term memory deficits.  Was put on Sinemet at  some point in course and did well at first, but the frequency has been increased.  Lately hallucinations have been more frequent and difficult to deal with with what sounds like worsening reality differentiation at times.  In the emergency room had hallucinations of a pistol in the bedside table (patient does have a pistol in the home which has been locked up).  Discussed several elements of emergency room care which he feels were traumatic-did not understand what was happening when he got an IV and felt like he was being tortured.  Allowed patient and wife to ventilate feelings about the difficulty of getting an outpatient neurology consult  Had extensive discussion with patient and wife at bedside of the natural course of hallucinations and Parkinson's and the risks and benefits of different therapies (focused on quetiapine and olanzapine).  Specifically discussed risk such as falls, arrhythmias, early death, worsening of Parkinson syndrome.  They are agreeable to trial a low-dose of quetiapine in the hospital and hope to see neurology.  Collateral information:  From wife at bedside  Psychiatric History:  Information collected from pt, wife Patient has essentially no psychiatric history (no formal dx, never seen a psychiatrist or been admitted) prior to strokes leading to diagnosis of vascular Parkinson's.  No history of substance use.  Does have remote history of smoking which he quit.  Family psych history: None   Social History:  Living with wife  Tobacco use: remote Alcohol use: denied Drug use: denied  Family History:  The patient's family history includes Colon cancer in his sister; Sleep apnea in his sister.  Medical History: Past Medical History:  Diagnosis Date   Arthritis    R shoulder, bone spur   Arthritis    "hands" (03/25/2016)   Chronic diastolic heart failure (HCC)    Chronic lower back pain    Coronary heart disease    Dr Pernell Dupre   H/O cardiovascular stress  test    perhaps last one was 2009   Heart murmur    12/29/11 echo: mild MR, no AS, trivial TR   History of gout    Hyperlipidemia    Hypertension    saw Dr. Linard Millers last earl;y- 2014, cardiac cath. last done ?2009, blocks seen didn't require any intervention at that point.   Narcolepsy    [name] 04-11-97; Mean Latency 1.52min, SOREM 2   OSA on CPAP    NPSG 12-13-98 AHI 22.7   PONV (postoperative nausea and vomiting)    Presence of permanent cardiac pacemaker    Shortness of breath    with exertion    Stroke ([place])    Type II diabetes mellitus [facility])     Surgical History: Past Surgical History:  Procedure Laterality Date   CARDIAC CATHETERIZATION  2009?   EP IMPLANTABLE DEVICE N/A 03/25/2016   Procedure: Pacemaker Implant - Dual Chamber;  Surgeon: Evans Lance, MD;  Location: [facility];  Service: Cardiovascular;  Laterality: N/A;   FRACTURE SURGERY     INSERT / REPLACE / REMOVE PACEMAKER  03/24/2016   IR RADIOLOGIST EVAL & MGMT  12/15/2021   LEFT AND RIGHT HEART CATHETERIZATION WITH CORONARY ANGIOGRAM N/A 06/05/2014  Procedure: LEFT AND RIGHT HEART CATHETERIZATION WITH CORONARY ANGIOGRAM;  Surgeon: Sinclair Grooms, MD;  Location: [facility] CATH LAB;  Service: Cardiovascular;  Laterality: N/A;   NASAL FRACTURE SURGERY     SHOULDER ARTHROSCOPY WITH ROTATOR CUFF REPAIR AND SUBACROMIAL DECOMPRESSION Right 08/16/2013   Procedure: SHOULDER ARTHROSCOPY WITH ROTATOR CUFF REPAIR AND SUBACROMIAL DECOMPRESSION;  Surgeon: [name], MD;  Location: [facility];  Service: Orthopedics;  Laterality: Right;  Right shoulder arthroscopy rotator cuff repair, subacromial decompression.   TONSILLECTOMY      Medications:   Current Facility-Administered Medications:    acetaminophen (TYLENOL) tablet 650 mg, 650 mg, Oral, Q6H PRN **OR** [DISCONTINUED] acetaminophen (TYLENOL) suppository 650 mg, 650 mg, Rectal, Q6H PRN, Jordan Flaming, MD   atorvastatin (LIPITOR) tablet 20 mg, 20 mg, Oral, Daily,  Jordan Flaming, MD, 20 mg at 12/19/21 0932   carbidopa-levodopa (SINEMET IR) 25-100 MG per tablet immediate release 1 tablet, 1 tablet, Oral, TID WC, Jordan Flaming, MD, 1 tablet at 12/19/21 1747   Carbidopa-Levodopa ER (SINEMET CR) 25-100 MG tablet controlled release 1 tablet, 1 tablet, Oral, QHS, Jordan Flaming, MD   clopidogrel (PLAVIX) tablet 75 mg, 75 mg, Oral, Daily, Jordan Flaming, MD, 75 mg at 12/19/21 0932   enoxaparin (LOVENOX) injection 40 mg, 40 mg, Subcutaneous, Q24H, Jordan Flaming, MD   haloperidol lactate (HALDOL) injection 2-4 mg, 2-4 mg, Intravenous, Q6H PRN, Etta Quill, DO, 2 mg at 12/19/21 0117   hydrALAZINE (APRESOLINE) injection 10 mg, 10 mg, Intravenous, Q4H PRN, Alcario Drought, Jared M, DO, 10 mg at 12/19/21 0111   insulin aspart (novoLOG) injection 0-15 Units, 0-15 Units, Subcutaneous, TID WC, Jordan Flaming, MD, 3 Units at 12/19/21 1747   lisinopril (ZESTRIL) tablet 20 mg, 20 mg, Oral, Daily, Jordan Flaming, MD, 20 mg at 12/19/21 0932   melatonin tablet 5 mg, 5 mg, Oral, QHS, Jordan Flaming, MD   metoprolol tartrate (LOPRESSOR) tablet 25 mg, 25 mg, Oral, BID, Jordan Flaming, MD, 25 mg at 12/19/21 0932   nitroGLYCERIN (NITROSTAT) SL tablet 0.4 mg, 0.4 mg, Sublingual, Q5 min PRN, Jordan Flaming, MD   QUEtiapine (SEROQUEL) tablet 12.5 mg, 12.5 mg, Oral, QHS, Meredeth Furber A  Allergies: No Known Allergies     Objective  Vital signs:  Temp:  [98 F (36.7 C)-99.1 F (37.3 C)] 98.7 F (37.1 C) (02/18 1514) Pulse Rate:  [65-80] 78 (02/18 1514) Resp:  [14-20] 17 (02/18 1514) BP: (119-175)/(69-138) 123/82 (02/18 1514) SpO2:  [92 %-100 %] 95 % (02/18 1514) Weight:  [94.7 kg] 94.7 kg (02/18 0146)  Psychiatric Specialty Exam:  Presentation  General Appearance: Appropriate for Environment; Casual Eye Contact:Good Speech:-- (occasionally garbled, loquacious, word finding difficulty) Speech Volume:Normal Handedness:No data recorded  Mood and Affect  Mood:--  ("better than I deserve" (clearly joking)) Affect:Full Range  Thought Process  Thought Processes:Coherent Descriptions of Associations:Circumstantial  Orientation:Full (Time, Place and Person)  Thought Content:-- (devoid of SI, HI, AH/VH)  History of Schizophrenia/Schizoaffective disorder:No data recorded Duration of Psychotic Symptoms:No data recorded Hallucinations:Hallucinations: -- (not RIS, no auditory hallucinations at time of exam (seem to be worse at night))  Ideas of Reference:-- (some paranoid ideations to ED staff)  Suicidal Thoughts:Suicidal Thoughts: No  Homicidal Thoughts:Homicidal Thoughts: No   Sensorium  Memory:Immediate Fair; Recent Fair; Remote Good Judgment:Fair Insight:Fair  Executive Functions  Concentration:No data recorded Attention Span:Fair [place]  Psychomotor Activity  Psychomotor Activity:Psychomotor Activity: -- (tremor in b/l upper extremities.)  Assets  Assets:Social Support  Sleep  Sleep:Sleep: Poor  Physical Exam: Physical Exam HENT:     Head: Normocephalic.  Eyes:     Conjunctiva/sclera: Conjunctivae normal.  Pulmonary:     Effort: Pulmonary effort is normal.  Neurological:     Mental Status: He is alert.     Comments: Some cogwheeling in b/l u/e worse at wrists. No rigidity in extremities x4   Review of Systems  All other systems reviewed and are negative. Some occasional lightheadedness.  Blood pressure 123/82, pulse 78, temperature 98.7 F (37.1 C), temperature source Oral, resp. rate 17, height 5\' 8"  (1.727 m), weight 94.7 kg, SpO2 95 %. Body mass index is 31.74 kg/m.

## 2021-12-19 NOTE — ED Notes (Signed)
ED TO INPATIENT HANDOFF REPORT  ED Nurse Name and Phone #: Laverle Pillard RN [phone]  S Name/Age/Gender Jordan [name] 81 y.o. male Room/Bed: 041C/041C  Code Status   Code Status: Full Code  Home/SNF/Other Home Patient oriented to: self and time Is this baseline? Yes   Triage Complete: Triage complete  Chief Complaint Hallucination, visual [R44.1]  Triage Note Pt here from home with c/o hallucinations seeing people in his house is an ongoing problem but has got worse recently , pt alert to time and self    Allergies No Known Allergies  Level of Care/Admitting Diagnosis ED Disposition     ED Disposition  [place]: [facility] [[id number]]  Level of Care: Telemetry Medical [104]  May place patient in observation at [facility] or [facility] if equivalent level of care is available:: No  Covid Evaluation: Asymptomatic Screening Protocol (No Symptoms)  Diagnosis: Hallucination, visual [756433]  Admitting Physician: Orma Flaming [[id number]]  Attending Physician: Orma Flaming [[id number]]          B Medical/Surgery History Past Medical History:  Diagnosis Date   Arthritis    R shoulder, bone spur   Arthritis    "hands" (03/25/2016)   Chronic diastolic heart failure (HCC)    Chronic lower back pain    Coronary heart disease    Dr Pernell Dupre   H/O cardiovascular stress test    perhaps last one was 2009   Heart murmur    12/29/11 echo: mild MR, no AS, trivial TR   History of gout    Hyperlipidemia    Hypertension    saw Dr. Linard Millers last earl;y- 2014, cardiac cath. last done ?2009, blocks seen didn't require any intervention at that point.   Narcolepsy    [name] 04-11-97; Mean Latency 1.66min, SOREM 2   OSA on CPAP    NPSG 12-13-98 AHI 22.7   PONV (postoperative nausea and vomiting)    Presence of permanent cardiac pacemaker    Shortness of breath    with exertion    Stroke ([place])    Type II diabetes mellitus  ([place])    Past Surgical History:  Procedure Laterality Date   CARDIAC CATHETERIZATION  2009?   EP IMPLANTABLE DEVICE N/A 03/25/2016   Procedure: Pacemaker Implant - Dual Chamber;  Surgeon: Evans Lance, MD;  Location: [facility];  Service: Cardiovascular;  Laterality: N/A;   FRACTURE SURGERY     INSERT / REPLACE / REMOVE PACEMAKER  03/24/2016   IR RADIOLOGIST EVAL & MGMT  12/15/2021   LEFT AND [name] HEART CATHETERIZATION WITH CORONARY ANGIOGRAM N/A 06/05/2014   Procedure: LEFT AND [name] HEART CATHETERIZATION WITH CORONARY ANGIOGRAM;  Surgeon: Sinclair Grooms, MD;  Location: [facility] CATH LAB;  Service: Cardiovascular;  Laterality: N/A;   NASAL FRACTURE SURGERY     SHOULDER ARTHROSCOPY WITH ROTATOR CUFF REPAIR AND SUBACROMIAL DECOMPRESSION [name] 08/16/2013   Procedure: SHOULDER ARTHROSCOPY WITH ROTATOR CUFF REPAIR AND SUBACROMIAL DECOMPRESSION;  Surgeon: [name], MD;  Location: [facility];  Service: Orthopedics;  Laterality: [name];  [name] shoulder arthroscopy rotator cuff repair, subacromial decompression.   TONSILLECTOMY       A IV Location/Drains/Wounds Patient Lines/Drains/Airways Status     Active Line/Drains/Airways     Name Placement date Placement time Site Days   Peripheral IV 12/18/21 22 G Posterior;[name] Hand 12/18/21  1615  Hand  1  Intake/Output Last 24 hours No intake or output data in the 24 hours ending 12/19/21 0041  Labs/Imaging Results for orders placed or performed during the hospital encounter of 12/18/21 (from the past 48 hour(s))  Resp Panel by RT-PCR (Flu A&B, Covid) Nasopharyngeal Swab     Status: None   Collection Time: 12/18/21 12:31 PM   Specimen: Nasopharyngeal Swab; Nasopharyngeal(NP) swabs in vial transport medium  Result Value Ref Range   SARS Coronavirus 2 by RT PCR NEGATIVE NEGATIVE    Comment: (NOTE) SARS-CoV-2 target nucleic acids are NOT DETECTED.  The SARS-CoV-2 RNA is generally detectable in upper  respiratory specimens during the acute phase of infection. The lowest concentration of SARS-CoV-2 viral copies this assay can detect is 138 copies/mL. A negative result does not preclude SARS-Cov-2 infection and should not be used as the sole basis for treatment or other patient management decisions. A negative result may occur with  improper specimen collection/handling, submission of specimen other than nasopharyngeal swab, presence of viral mutation(s) within the areas targeted by this assay, and inadequate number of viral copies(<138 copies/mL). A negative result must be combined with clinical observations, patient history, and epidemiological information. The expected result is Negative.  Fact Sheet for Patients:  EntrepreneurPulse.com.au  Fact Sheet for Healthcare Providers:  IncredibleEmployment.be  This test is no t yet approved or cleared by the [place] FDA and  has been authorized for detection and/or diagnosis of SARS-CoV-2 by FDA under an Emergency Use Authorization (EUA). This EUA will remain  in effect (meaning this test can be used) for the duration of the COVID-19 declaration under Section 564(b)(1) of the Act, 21 U.S.C.section 360bbb-3(b)(1), unless the authorization is terminated  or revoked sooner.       Influenza A by PCR NEGATIVE NEGATIVE   Influenza B by PCR NEGATIVE NEGATIVE    Comment: (NOTE) The Xpert Xpress SARS-CoV-2/FLU/RSV plus assay is intended as an aid in the diagnosis of influenza from Nasopharyngeal swab specimens and should not be used as a sole basis for treatment. Nasal washings and aspirates are unacceptable for Xpert Xpress SARS-CoV-2/FLU/RSV testing.  Fact Sheet for Patients: EntrepreneurPulse.com.au  Fact Sheet for Healthcare Providers: IncredibleEmployment.be  This test is not yet approved or cleared by the [place] FDA and has been authorized for  detection and/or diagnosis of SARS-CoV-2 by FDA under an Emergency Use Authorization (EUA). This EUA will remain in effect (meaning this test can be used) for the duration of the COVID-19 declaration under Section 564(b)(1) of the Act, 21 U.S.C. section 360bbb-3(b)(1), unless the authorization is terminated or revoked.  Performed at [facility], [place] [address] [place], [place] [address]   CBC with Differential     Status: Abnormal   Collection Time: 12/18/21  1:18 PM  Result Value Ref Range   WBC 11.4 (H) 4.0 - 10.5 K/uL   RBC 4.75 4.22 - 5.81 MIL/uL   Hemoglobin 14.5 13.0 - 17.0 g/dL   HCT 43.1 39.0 - 52.0 %   MCV 90.7 80.0 - 100.0 fL   MCH 30.5 26.0 - 34.0 pg   MCHC 33.6 30.0 - 36.0 g/dL   RDW 13.2 11.5 - 15.5 %   Platelets 284 150 - 400 K/uL   nRBC 0.0 0.0 - 0.2 %   Neutrophils Relative % 65 %   Neutro Abs 7.4 1.7 - 7.7 K/uL   Lymphocytes Relative 23 %   Lymphs Abs 2.7 0.7 - 4.0 K/uL   Monocytes Relative 9 %  Monocytes Absolute 1.0 0.1 - 1.0 K/uL   Eosinophils Relative 2 %   Eosinophils Absolute 0.2 0.0 - 0.5 K/uL   Basophils Relative 0 %   Basophils Absolute 0.1 0.0 - 0.1 K/uL   Immature Granulocytes 1 %   Abs Immature Granulocytes 0.06 0.00 - 0.07 K/uL    Comment: Performed at [facility] [address] [place], [place] [address]  Comprehensive metabolic panel     Status: Abnormal   Collection Time: 12/18/21  1:18 PM  Result Value Ref Range   Sodium 139 135 - 145 mmol/L   Potassium 5.1 3.5 - 5.1 mmol/L    Comment: HEMOLYSIS AT THIS LEVEL MAY AFFECT RESULT   Chloride 105 98 - 111 mmol/L   CO2 24 22 - 32 mmol/L   Glucose, Bld 163 (H) 70 - 99 mg/dL    Comment: Glucose reference range applies only to samples taken after fasting for at least 8 hours.   BUN 28 (H) 8 - 23 mg/dL   Creatinine, Ser 1.10 0.61 - 1.24 mg/dL   Calcium 9.6 8.9 - 10.3 mg/dL   Total Protein 7.2 6.5 - 8.1 g/dL   Albumin 3.9 3.5 - 5.0 g/dL   AST 36 15 - 41 U/L   ALT 10 0 - 44  U/L   Alkaline Phosphatase 54 38 - 126 U/L   Total Bilirubin 1.7 (H) 0.3 - 1.2 mg/dL   GFR, Estimated >60 >60 mL/min    Comment: (NOTE) Calculated using the CKD-EPI Creatinine Equation (2021)    Anion gap 10 5 - 15    Comment: Performed at [facility] [address], [place], [place] [address]  Urinalysis, Routine w reflex microscopic Urine, Clean Catch     Status: Abnormal   Collection Time: 12/18/21  7:57 PM  Result Value Ref Range   Color, Urine YELLOW YELLOW   APPearance CLEAR CLEAR   Specific Gravity, Urine 1.026 1.005 - 1.030   pH 5.0 5.0 - 8.0   Glucose, UA NEGATIVE NEGATIVE mg/dL   Hgb urine dipstick MODERATE (A) NEGATIVE   Bilirubin Urine NEGATIVE NEGATIVE   Ketones, ur 5 (A) NEGATIVE mg/dL   Protein, ur NEGATIVE NEGATIVE mg/dL   Nitrite NEGATIVE NEGATIVE   Leukocytes,Ua NEGATIVE NEGATIVE   RBC / HPF 21-50 0 - 5 RBC/hpf   WBC, UA 0-5 0 - 5 WBC/hpf   Bacteria, UA NONE SEEN NONE SEEN   Squamous Epithelial / LPF 0-5 0 - 5   Mucus PRESENT     Comment: Performed at [facility], [place] [address], [place], [place] [address]   CT Head Wo Contrast  Result Date: 12/18/2021 CLINICAL DATA:  Neuro deficit, acute stroke suspected. EXAM: CT HEAD WITHOUT CONTRAST TECHNIQUE: Contiguous axial images were obtained from the base of the skull through the vertex without intravenous contrast. RADIATION DOSE REDUCTION: This exam was performed according to the departmental dose-optimization program which includes automated exposure control, adjustment of the mA and/or kV according to patient size and/or use of iterative reconstruction technique. COMPARISON:  CT head dated June 30, 2021 FINDINGS: Brain: No evidence of acute infarction, hemorrhage, hydrocephalus, extra-axial collection or mass lesion/mass effect. Moderate cerebral atrophy and chronic microvascular ischemic changes of the white matter, unchanged. Bilateral chronic thalamic lacunar infarcts. Vascular: No hyperdense vessel  or unexpected calcification. Skull: Normal. Negative for fracture or focal lesion. Sinuses/Orbits: Partial opacification of the left maxillary sinus, complete opacification of the left frontal sinus and patchy opacification of  ethmoid air cells. Other: None. IMPRESSION: 1.  No acute intracranial abnormality. 2. Cerebral atrophy, white matter ischemic changes and chronic lacunar infarcts of bilateral thalami. 3.  Chronic paranasal sinus disease. Electronically Signed   By: Keane Police D.O.   On: 12/18/2021 14:19   DG Chest Port 1 View  Result Date: 12/18/2021 CLINICAL DATA:  Shortness of breath. EXAM: PORTABLE CHEST 1 VIEW COMPARISON:  06/21/2020. FINDINGS: Trachea is midline. Heart is enlarged. Thoracic aorta is calcified. Pacemaker lead tips are in the [name] atrium and [name] ventricle. Lungs are somewhat low in volume with minimal left basilar subsegmental volume loss. No pleural fluid. IMPRESSION: No acute findings. Electronically Signed   By: Lorin Picket M.D.   On: 12/18/2021 14:34    Pending Labs Unresulted Labs (From admission, onward)     Start     Ordered   12/19/21 4132  Basic metabolic panel  Tomorrow morning,   R        12/18/21 1905   12/19/21 0500  CBC  Tomorrow morning,   R        12/18/21 1905   12/18/21 1910  TSH  Once,   R        12/18/21 1909   12/18/21 1910  Urine rapid drug screen (hosp performed)  ONCE - STAT,   STAT        12/18/21 1909            Vitals/Pain Today's Vitals   12/18/21 1915 12/18/21 2015 12/18/21 2222 12/18/21 2345  BP: (!) 167/109 (!) 151/110  (!) 169/116  Pulse:  73 72 80  Resp: 17 18 20 14   Temp:  98.8 F (37.1 C)    TempSrc:  Oral    SpO2:  100% 93% 95%  PainSc:        Isolation Precautions No active isolations  Medications Medications  enoxaparin (LOVENOX) injection 40 mg (40 mg Subcutaneous Not Given 12/18/21 2321)  acetaminophen (TYLENOL) tablet 650 mg (has no administration in time range)    Or  acetaminophen (TYLENOL)  suppository 650 mg (has no administration in time range)  0.9 %  sodium chloride infusion ( Intravenous New Bag/Given 12/18/21 2323)  nitroGLYCERIN (NITROSTAT) SL tablet 0.4 mg (has no administration in time range)  metoprolol tartrate (LOPRESSOR) tablet 25 mg (25 mg Oral Not Given 12/18/21 2257)  atorvastatin (LIPITOR) tablet 20 mg (has no administration in time range)  lisinopril (ZESTRIL) tablet 20 mg (has no administration in time range)  clopidogrel (PLAVIX) tablet 75 mg (has no administration in time range)  melatonin tablet 5 mg (5 mg Oral Patient Refused/Not Given 12/18/21 2257)  carbidopa-levodopa (SINEMET IR) 25-100 MG per tablet immediate release 1 tablet (has no administration in time range)  Carbidopa-Levodopa ER (SINEMET CR) 25-100 MG tablet controlled release 1 tablet (has no administration in time range)  insulin aspart (novoLOG) injection 0-15 Units (has no administration in time range)  sodium chloride 0.9 % bolus 500 mL (500 mLs Intravenous New Bag/Given 12/18/21 1624)    Mobility walks with person assist High fall risk   Focused Assessments    R Recommendations: See Admitting Provider Note  Report given to:   Additional Notes:

## 2021-12-20 DIAGNOSIS — G20 Parkinson's disease: Secondary | ICD-10-CM | POA: Diagnosis not present

## 2021-12-20 DIAGNOSIS — R441 Visual hallucinations: Secondary | ICD-10-CM | POA: Diagnosis not present

## 2021-12-20 LAB — GLUCOSE, CAPILLARY
Glucose-Capillary: 115 mg/dL — ABNORMAL HIGH (ref 70–99)
Glucose-Capillary: 173 mg/dL — ABNORMAL HIGH (ref 70–99)
Glucose-Capillary: 157 mg/dL — ABNORMAL HIGH (ref 70–99)
Glucose-Capillary: 262 mg/dL — ABNORMAL HIGH (ref 70–99)

## 2021-12-20 LAB — MAGNESIUM: Magnesium: 1.4 mg/dL — ABNORMAL LOW (ref 1.7–2.4)

## 2021-12-20 MED ORDER — MAGNESIUM SULFATE 4 GM/100ML IV SOLN
4.0000 g | Freq: Once | INTRAVENOUS | Status: AC
  Administered 2021-12-20: 4 g via INTRAVENOUS
  Filled 2021-12-20: qty 100

## 2021-12-20 MED ORDER — OMEGA 3 1200 MG PO CAPS: 1200.0000 mg | ORAL_CAPSULE | Freq: Every day | ORAL | Status: DC

## 2021-12-20 MED ORDER — OLANZAPINE 10 MG IM SOLR
2.5000 mg | Freq: Once | INTRAMUSCULAR | Status: DC | PRN
Start: 2021-12-20 — End: 2021-12-23
  Filled 2021-12-20: qty 10

## 2021-12-20 MED ORDER — OMEGA-3-ACID ETHYL ESTERS 1 G PO CAPS
1.0000 g | ORAL_CAPSULE | Freq: Every day | ORAL | Status: DC
  Administered 2021-12-20 – 2021-12-23 (×4): 1 g via ORAL
  Filled 2021-12-20 (×4): qty 1

## 2021-12-20 NOTE — Consult Note (Signed)
[facility] Followup Face-to-Face Psychiatric Evaluation   Service Date: December 20, 2021 LOS:  LOS: 0 days    Assessment  Jordan [name] is a 81 y.o. male admitted medically for 12/18/2021 12:31 PM for hallucinations. He carries no formal psychiatric diagnoses and has a past medical history most significant for Parkinson's disease. Psychiatry was consulted for hallucinations by Orma Flaming, MD.    His current presentation of hallucinations gradually worsening from vivid dreams over the past several years is most consistent with known diagnosis of Parkinson's disease; medication of Sinemet also likely contributes to these hallucinations.  Had an extensive discussion on natural course of illness with patient and wife at bedside and risk/benefit/side effect discussion of quetiapine.  They are agreeable to trial of this medication at a low dose in the hospital.  Will trial quetiapine 12.5 mg tonight and reassess tomorrow.  2/19: Pt with no reported hallucinations overnight, seems to be doing well. Had more cogwheeling at wrists and now prominent at elbows, unclear if this is 2/2 quetiapine (unlikely at current dose) or if I am seeing him at a different point in his Sinemet dosing. In any case, pt has not noticed any change from yesterday. Wife not present.   Diagnoses:  Active Hospital problems: Active Problems:   HLD (hyperlipidemia)   OSA on CPAP/narcolepsy    DM (diabetes mellitus), type 2 with peripheral vascular complications (HCC)   Benign essential HTN   Chronic diastolic heart failure (HCC)   Hallucinations, visual   Parkinson disease (HCC)   Frequent falls   History of CVA (cerebrovascular accident)     Plan  ## Safety and Observation Level:  - Based on my clinical evaluation, I estimate the patient to be at low risk of self harm in the current setting - At this time, we we will defer level of observation to primary team; patient at increased risk of  delirium.  ## Medications:  -- Start quetiapine 12.5 mg nightly.   ## Medical Decision Making Capacity:  Not formally assessed; engaged in risk/benefit discussion of medication order above although often deferred to wife.  ## Further Work-up:  -- Consider B12, folate. --Appreciate head imaging with no acute findings.   -- most recent EKG on 2/17 had QtC of 438 -- Pertinent labwork reviewed earlier this admission includes: TSH.  ## Disposition:  -- Per primary team  ## Behavioral / Environmental:  -- - RN to open blinds every AM. - To Bedside: Glasses, hearing aide, and pt's own shoes. Make available to patients. when possible and encourage use. - Encourage po fluids when appropriate, keep fluids within reach. - OOB to chair with meals. - Passive ROM exercises to all extremities with AM & PM care. - RN to assess orientation to person, time and place QAM and PRN. - Recommend extended visitation hours with familiar family/friends as feasible. - Staff to minimize disturbances at night. Turn off television when pt asleep or when not in use. -- explain procedures to patient  ##Legal Status   Thank you for this consult request. Recommendations have been communicated to the primary team.  We will continue to follow at this time.   [place] A Admir Candelas   NEW history  Relevant Aspects of Hospital Course:  Admitted on 12/18/2021 for hallucinations.  Patient Report:  Patient is seen without wife. He reports no new hallucinations overnight which he attributes to the medication - slept very well last night. He is oriented to month, year, and day  of the week, although pt comments that he has been re-oriented multiple times today and shouldn't get credit for day of the week. Continues to use humor as a defense mechanism. No SI, HI, AH/VH as above. Laments that his wife is not present for visit, let him know I will be by tomorrow to hopefully speak with his wife.   Collateral  information:  From wife at bedside  Psychiatric History/Social History/Family History/Medical history/Surgical History:   See initial consult note.  Medications:   Current Facility-Administered Medications:    acetaminophen (TYLENOL) tablet 650 mg, 650 mg, Oral, Q6H PRN **OR** [DISCONTINUED] acetaminophen (TYLENOL) suppository 650 mg, 650 mg, Rectal, Q6H PRN, Orma Flaming, MD   atorvastatin (LIPITOR) tablet 20 mg, 20 mg, Oral, Daily, Orma Flaming, MD, 20 mg at 12/20/21 7322   carbidopa-levodopa (SINEMET IR) 25-100 MG per tablet immediate release 1 tablet, 1 tablet, Oral, TID WC, Orma Flaming, MD, 1 tablet at 12/20/21 1332   Carbidopa-Levodopa ER (SINEMET CR) 25-100 MG tablet controlled release 1 tablet, 1 tablet, Oral, QHS, Orma Flaming, MD, 1 tablet at 12/19/21 2112   clopidogrel (PLAVIX) tablet 75 mg, 75 mg, Oral, Daily, Orma Flaming, MD, 75 mg at 12/20/21 0907   enoxaparin (LOVENOX) injection 40 mg, 40 mg, Subcutaneous, Q24H, Orma Flaming, MD, 40 mg at 12/19/21 2114   hydrALAZINE (APRESOLINE) injection 10 mg, 10 mg, Intravenous, Q4H PRN, Alcario Drought, Jared M, DO, 10 mg at 12/19/21 0111   insulin aspart (novoLOG) injection 0-15 Units, 0-15 Units, Subcutaneous, TID WC, Orma Flaming, MD, 3 Units at 12/20/21 1332   lisinopril (ZESTRIL) tablet 20 mg, 20 mg, Oral, Daily, Orma Flaming, MD, 20 mg at 12/20/21 0254   melatonin tablet 5 mg, 5 mg, Oral, QHS, Orma Flaming, MD, 5 mg at 12/19/21 2112   metoprolol tartrate (LOPRESSOR) tablet 25 mg, 25 mg, Oral, BID, Orma Flaming, MD, 25 mg at 12/20/21 2706   nitroGLYCERIN (NITROSTAT) SL tablet 0.4 mg, 0.4 mg, Sublingual, Q5 min PRN, Orma Flaming, MD   OLANZapine [facility]) injection 2.5 mg, 2.5 mg, Intramuscular, Once PRN, Bhagat, Srishti L, MD   omega-3 acid ethyl esters (LOVAZA) capsule 1 g, 1 g, Oral, Daily, Joette Catching T, MD, 1 g at 12/20/21 1332   QUEtiapine (SEROQUEL) tablet 12.5 mg, 12.5 mg, Oral, QHS, Linnea Todisco A,  12.5 mg at 12/19/21 2113  Allergies: No Known Allergies     Objective  Vital signs:  Temp:  [97.5 F (36.4 C)-97.9 F (36.6 C)] 97.9 F (36.6 C) (02/19 1250) Pulse Rate:  [60-72] 60 (02/19 1250) Resp:  [14-21] 21 (02/19 0743) BP: (105-153)/(64-90) 110/64 (02/19 1250) SpO2:  [94 %-97 %] 94 % (02/19 1250)  Psychiatric Specialty Exam:  Presentation  General Appearance: Appropriate for Environment; Casual Eye Contact:Good Speech:-- (loquacious) Speech Volume:Normal Handedness:No data recorded  Mood and Affect  Mood:-- (Pretty good) Affect:Full Range  Thought Process  Thought Processes:Coherent Descriptions of Associations:Intact  Orientation:Full (Time, Place and Person)  Thought Content:-- (devoid of SI, HI, delusions, paranoia)  History of Schizophrenia/Schizoaffective disorder:No data recorded Duration of Psychotic Symptoms:No data recorded Hallucinations:Hallucinations: -- (none reported last 24h not RIS)  Ideas of Reference:None  Suicidal Thoughts:Suicidal Thoughts: No  Homicidal Thoughts:Homicidal Thoughts: No   Sensorium  Memory:Immediate Fair; Recent Fair; Remote Good Judgment:Fair Insight:Fair  Executive Functions  Concentration:Fair  Attention Span:Fair [place]  Psychomotor Activity  Psychomotor Activity:Psychomotor Activity: -- (some cogwheelingon physical exam, tremor unchanged)  Assets  Assets:Social Support  Sleep  Sleep:Sleep: Good Number of Hours  of Sleep: 10   Physical Exam: Physical Exam HENT:     Head: Normocephalic.  Eyes:     Conjunctiva/sclera: Conjunctivae normal.  Pulmonary:     Effort: Pulmonary effort is normal.  Neurological:     Mental Status: He is alert.     Comments: No rigidity but cogwheeling worse than yesterday, will try to see shortly after Sinemet dosing tomorrow.    Review of Systems  All other systems reviewed and are negative. Some occasional  lightheadedness.  Blood pressure 110/64, pulse 60, temperature 97.9 F (36.6 C), temperature source Oral, resp. rate (!) 21, height 5\' 8"  (1.727 m), weight 94.7 kg, SpO2 94 %. Body mass index is 31.74 kg/m.

## 2021-12-20 NOTE — Consult Note (Signed)
Neurology Consultation Reason for Consult: Worsening hallucinations Requesting Physician: Cheri Guppy  CC: Hallucinations  History is obtained from: Chart review, patient and wife  HPI: Jordan [name] is a 81 y.o. 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  He was also ordered for Haldol as needed for agitation by the admitting hospitalist and did receive this medication at 1 AM on 2/18 (2 mg). Home zoloft and klonopin were discontinued here.   Patient reports that he is here due to dizziness that he attributes to right ear dysfunction and reported he tried an exercise he normally tries at home to help this without improvement of the dizziness.  The dizziness lasts 10 seconds and is not associated with position changes from lying down to sitting and but otherwise resolves if he waits.  He denies any recent changes to his medications though he tells me he defers to his wife regarding details of the same.  To me he reports he thinks his hallucinations are secondary to peripheral vision issues and he feels they are secondary to movement in the corner of his eyes.  He notes he does have residual right hand and leg numbness/tingling from his stroke but no new numbness or tingling and no other new focal neurological  deficits.  He additionally denies any hematuria or urinary issues, signs and symptoms of infection etc. he reports his wife's number is [phone] (this is patient's number per wife), but per chart review it is [phone] (which is accurate)  Wife reports progressively worsening ambulation since discharge from rehab w/ poor tolerance for home PT due to pain. She estimates he uses tramadol < 1 a week and this is not associated with hallucinations. Has not tried it before PT to see if he tolerates it better. No new medications other than lidocaine patches for his back. Hematuria is felt to be secondary to large kidney stone; this does not cause him any pain or symptoms. She also notes poor dentition, needs extraction of broken teeth for which oral surgery is pending. She notes he is not taking the adderall in a long time (it has been out of stock) and this may be contributing to his sleepiness. She has not noticed a pattern to his hallucinations, and they have been non-threatening and benign outside of the hospital.  She notes that he did have some hospital delirium during his prior hospitalization, but not combative as he was during this ED visit.   ROS: All other review of systems was negative except as noted in the HPI, though not clear if it is reliable given his hallucinations  Past Medical History:  Diagnosis Date   Arthritis    R shoulder, bone spur   Arthritis    "hands" (03/25/2016)   Chronic diastolic heart failure (HCC)    Chronic lower back pain    Coronary heart disease    Dr Pernell Dupre   H/O cardiovascular stress  test    perhaps last one was 2009   Heart murmur    12/29/11 echo: mild MR, no AS, trivial TR   History of gout    Hyperlipidemia    Hypertension    saw Dr. Linard Millers last earl;y- 2014, cardiac cath. last done ?2009, blocks seen didn't require any intervention at that point.   Narcolepsy    [name] 04-11-97; Mean Latency 1.66min, SOREM 2   OSA on CPAP    NPSG 12-13-98 AHI  22.7   PONV (postoperative nausea and vomiting)    Presence of permanent cardiac pacemaker    Shortness of breath    with exertion    Stroke ([place])    Type II diabetes mellitus ([place])    Past Surgical History:  Procedure Laterality Date   CARDIAC CATHETERIZATION  2009?   EP IMPLANTABLE DEVICE N/A 03/25/2016   Procedure: Pacemaker Implant - Dual Chamber;  Surgeon: Evans Lance, MD;  Location: [facility];  Service: Cardiovascular;  Laterality: N/A;   FRACTURE SURGERY     INSERT / REPLACE / REMOVE PACEMAKER  03/24/2016   IR RADIOLOGIST EVAL & MGMT  12/15/2021   LEFT AND RIGHT HEART CATHETERIZATION WITH CORONARY ANGIOGRAM N/A 06/05/2014   Procedure: LEFT AND RIGHT HEART CATHETERIZATION WITH CORONARY ANGIOGRAM;  Surgeon: Sinclair Grooms, MD;  Location: [facility] CATH [facility];  Service: Cardiovascular;  Laterality: N/A;   NASAL FRACTURE SURGERY     SHOULDER ARTHROSCOPY WITH ROTATOR CUFF REPAIR AND SUBACROMIAL DECOMPRESSION Right 08/16/2013   Procedure: SHOULDER ARTHROSCOPY WITH ROTATOR CUFF REPAIR AND SUBACROMIAL DECOMPRESSION;  Surgeon: [name], MD;  Location: [facility];  Service: Orthopedics;  Laterality: Right;  Right shoulder arthroscopy rotator cuff repair, subacromial decompression.   TONSILLECTOMY      Current Facility-Administered Medications:    acetaminophen (TYLENOL) tablet 650 mg, 650 mg, Oral, Q6H PRN **OR** [DISCONTINUED] acetaminophen (TYLENOL) suppository 650 mg, 650 mg, Rectal, Q6H PRN, Orma Flaming, MD   atorvastatin (LIPITOR) tablet 20 mg, 20 mg, Oral, Daily, Orma Flaming, MD, 20 mg at 12/20/21 2774   carbidopa-levodopa (SINEMET IR) 25-100 MG per tablet immediate release 1 tablet, 1 tablet, Oral, TID WC, Orma Flaming, MD, 1 tablet at 12/20/21 1287   Carbidopa-Levodopa ER (SINEMET CR) 25-100 MG tablet controlled release 1 tablet, 1 tablet, Oral, QHS, Orma Flaming, MD, 1 tablet at 12/19/21 2112   clopidogrel (PLAVIX) tablet 75 mg, 75 mg, Oral, Daily, Orma Flaming, MD, 75 mg at 12/20/21 0907   enoxaparin (LOVENOX) injection 40 mg, 40 mg, Subcutaneous, Q24H, Orma Flaming, MD, 40 mg at 12/19/21 2114   haloperidol lactate (HALDOL) injection 2-4 mg, 2-4 mg, Intravenous, Q6H PRN, Etta Quill, DO, 2 mg at 12/19/21 0117   hydrALAZINE (APRESOLINE) injection 10 mg, 10 mg, Intravenous, Q4H PRN, Alcario Drought, Jared M, DO, 10 mg at 12/19/21 0111   insulin aspart (novoLOG) injection 0-15 Units, 0-15 Units, Subcutaneous, TID WC, Orma Flaming, MD, 3 Units at 12/19/21 1747   lisinopril (ZESTRIL) tablet 20 mg, 20 mg, Oral, Daily, Orma Flaming, MD, 20 mg at 12/20/21 8676   melatonin tablet 5 mg, 5 mg, Oral, QHS, Orma Flaming, MD, 5 mg at 12/19/21 2112   metoprolol tartrate (LOPRESSOR) tablet 25 mg, 25 mg, Oral, BID, Orma Flaming, MD, 25 mg at 12/20/21 7209   nitroGLYCERIN (NITROSTAT) SL tablet 0.4 mg, 0.4 mg, Sublingual, Q5 min PRN, Orma Flaming, MD   omega-3 acid ethyl esters (LOVAZA) capsule 1 g, 1 g, Oral, Daily, Joette Catching  T, MD   QUEtiapine (SEROQUEL) tablet 12.5 mg, 12.5 mg, Oral, QHS, Cinderella, Margaret A, 12.5 mg at 12/19/21 2113  Current Outpatient Medications  Medication Instructions   acetaminophen (TYLENOL) 1,000 mg, Oral, 2 times daily   amphetamine-dextroamphetamine (ADDERALL) 10 MG tablet 10 mg, Oral, Daily PRN   atorvastatin (LIPITOR) 20 mg, Oral, Daily   bifidobacterium infantis (ALIGN) capsule 1 capsule, Oral, Daily   carbidopa-levodopa (SINEMET) 25-100 MG tablet 1 tablet, Oral, 3 times daily   Carbidopa-Levodopa ER (SINEMET CR) 25-100 MG tablet controlled release 1 tablet, Oral, Daily at bedtime   cholecalciferol (VITAMIN D3) 1,000 Units, Oral, Daily   clonazePAM (KLONOPIN) 0.5 MG tablet TAKE 1 TABLET BY MOUTH EVERY DAY AT BEDTIME AS NEEDED FOR SLEEP   clopidogrel (PLAVIX) 75 mg, Oral, Daily   Coenzyme Q10 (CO Q-10 PO) 1 capsule, Oral, Daily   lidocaine (LIDODERM) 5 % 1 patch, Transdermal, 2 times daily   lisinopril  (ZESTRIL) 10 mg, Oral, Daily   Melatonin 10 mg, Oral, Daily at bedtime   metFORMIN (GLUCOPHAGE) 500 mg, Oral, 4 tables once a day   metoprolol tartrate (LOPRESSOR) 25 mg, Oral, 2 times daily   Multiple Vitamin (MULTIVITAMIN WITH MINERALS) TABS tablet 1 tablet, Oral, Daily   NITROSTAT 0.4 MG SL tablet PLACE 1 TABLET (0.4 MG TOTAL) UNDER THE TONGUE EVERY 5 (FIVE) MINUTES AS NEEDED FOR CHEST PAIN.   Omega 3 1,200 mg, Oral, Daily   PRESCRIPTION MEDICATION See admin instructions, CPAP- At bedtime    sertraline (ZOLOFT) 25 mg, Oral, Daily   traMADol (ULTRAM) 50 mg, Oral, Every 6 hours PRN     Family History  Problem Relation Age of Onset   Sleep apnea Sister    Colon cancer Sister    Social History:  reports that he quit smoking about 54 years ago. His smoking use included cigarettes. He has a 15.00 pack-year smoking history. He has never used smokeless tobacco. He reports current alcohol use. He reports that he does not use drugs.   Exam: Current vital signs: BP 124/84 (BP Location: Left Arm)    Pulse 72    Temp 97.9 F (36.6 C) (Oral)    Resp (!) 21    Ht 5\' 8"  (1.727 m)    Wt 94.7 kg    SpO2 96%    BMI 31.74 kg/m  Vital signs in last 24 hours: Temp:  [97.5 F (36.4 C)-98.7 F (37.1 C)] 97.9 F (36.6 C) (02/19 0455) Pulse Rate:  [67-78] 72 (02/19 0743) Resp:  [14-21] 21 (02/19 0743) BP: (105-153)/(68-90) 124/84 (02/19 0743) SpO2:  [95 %-97 %] 96 % (02/19 0743)   Physical Exam  Constitutional: Appears well-developed and well-nourished.  Psych: Affect appropriate to situation, calm and cooperative, occasionally jokes Eyes: No scleral injection HENT: No oropharyngeal obstruction.  MSK: no joint deformities.  Cardiovascular: Perfusing extremities well Respiratory: Effort normal, non-labored breathing GI: Soft.  No distension. There is no tenderness.  Skin: Warm dry and intact visible skin  Neuro: Mental Status: Patient is awake, alert, oriented to person, place, month, year,  and situation. Patient is able to give some limited history which does not fully corroborate what is documented in the chart No signs of aphasia or neglect Cranial Nerves: II: Visual Fields are full. Pupils are equal, round, and reactive to light.   III,IV, VI: EOMI other than mild ptosis of the right eye V: Facial sensation is symmetric to light touch VII: Facial movement is symmetric.  VIII: hearing is intact to  voice X: Uvula elevates symmetrically XI: Shoulder shrug is symmetric. XII: tongue is midline without atrophy or fasciculations.  Motor: Tone is mildly cogwheeling and mild rigidity. Bulk is notable for some atrophy in the hands. Mild bradykinesia and masked facies. 5/5 strength was present in all four extremities except for mild left hip flexor weakness 4/5 which he attributes to a prior hip fracture Sensory: Sensation is symmetric to light touch in the arms and legs. Deep Tendon Reflexes: 2+ and symmetric in the biceps and patellae.  Plantars: Toes are downgoing bilaterally.  Cerebellar: FNF and HKS are intact bilaterally Gait:  Deferred   I have reviewed labs in epic and the results pertinent to this consultation are:  Basic Metabolic Panel: Recent Labs  Lab 12/18/21 1318 12/19/21 0440  NA 139 140  K 5.1 3.6  CL 105 106  CO2 24 23  GLUCOSE 163* 104*  BUN 28* 23  CREATININE 1.10 0.89  CALCIUM 9.6 9.2    CBC: Recent Labs  Lab 12/18/21 1318 12/19/21 0440  WBC 11.4* 11.0*  NEUTROABS 7.4  --   HGB 14.5 13.0  HCT 43.1 38.9*  MCV 90.7 88.4  PLT 284 253    Coagulation Studies: No results for input(s): LABPROT, INR in the last 72 hours.   Lab Results  Component Value Date   MGNOIBBC48 889 12/08/2021    Lab Results  Component Value Date   HGBA1C 7.9 (H) 04/14/2020   Lab Results  Component Value Date   TSH 1.030 12/19/2021   Magnesium on 12/08/2021 1.5  Chest x-ray negative for infection, UA notable for hematuria but no evidence of infection,  and notably hematuria has been present since at least August 2022   I have reviewed the images obtained: Head CT personally reviewed, remote bilateral thalamic lacunar infarcts, left greater than right, chronic microvascular disease and atrophy (agree with radiology read).  No acute intracranial process  Impression: This is an 81 year old gentleman with multiple comorbidities as detailed above, presenting with worsening gait at home as well as worsening hallucinations.  Hallucinations appear to have responded well to quetiapine.  Recommendations:  # Parkinson's  - Continue home dose of sinemet (25-100 mg TID + 25-100 mg ER QHS)   # Alternative etiologies for delirium - Infection is adequately ruled out (negative chest x-ray, UA, afebrile, leukocytosis low level and stable) - Attention to hematuria per primary team - Hypomagnesemia can contribute to delirium and therefore I will recheck magnesium, notably this was low on 12/08/2021.  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  If patient is refusing oral medications a one-time dose of olanzapine IM can be used (Haldol discontinued and one-time olanzapine order placed)  # Worsening gait function - MRI cervical spine  # Lower back pain - Wife reports no significant change since his last MRI of his lumbar spine and his lower back symptoms - Wife requesting consultation with Dr. Estanislado Pandy regarding possibility of inpatient kyphoplasty  Neurology will follow-up cervical spine MRI but  otherwise will be available on an as-needed basis going forward  [facility]  MD-PhD [facility] [phone] Available 7 AM to 7 PM, outside these hours please contact Neurologist on call listed on AMION

## 2021-12-20 NOTE — Progress Notes (Signed)
Jordan [name]  MVH:[id number] DOB: [birth year] DOA: 12/18/2021 PCP: [name], MD    Brief Narrative:  [phone] with a history of Parkinson's disease, bradycardia status post pacer, DM2, OSA on CPAP, CVA 2021, HTN, and diastolic CHF who presented to the ED with the acute worsening of hallucinations which she had been experiencing since August 2022.  He is followed by neurology in the outpatient setting and it was felt that his hallucinations were due to his Sinemet.  In the ED CT head was without acute finding.  Consultants:  Psychiatry  Code Status: FULL CODE  Antimicrobials:  None  DVT prophylaxis: Lovenox  Interim Hx: Afebrile.  Vital stable.  No acute events reported overnight.  The patient's wife stayed at the bedside through the night to assist in the patient's orientation.  The patient is alert and pleasant this afternoon but not fully oriented to situation or time.  Assessment & Plan:  Acute on subacute visual hallucinations with delusions Felt to be due to Sinemet +Parkinsons itself - ruled out sources of acute infection -gently hydrated - holding Klonopin and Zoloft - TSH normal -continue trial of seroquel as suggested by Psychiatry - Neurology has evaluated with no new suggestions in regard to treatment of his Parkinson's -avoid Haldol per neurology recommendation  Frequent falls Due to above plus Parkinson disease - PT/OT  Parkinson's disease Followed by neurology in the outpatient setting -on very low-dose Sinemet -neuro has evaluated the patient during this hospital stay and suggest continuing his current dose of Sinemet  Hypomagnesemia Supplement and follow  Essential HTN Well-controlled at present  Hematuria Felt to be due to kidney stones and has previously been evaluated  DM2 CBG well controlled at present  Chronic diastolic CHF No clinical evidence of acute exacerbation  HLD Continue usual Lipitor dose  OSA on CPAP Continue usual CPAP  regimen  History of CVA Continue usual Plavix and statin    Family Communication:  Disposition: From home - may require rehab depending on PT/OT eval  Objective: Blood pressure 124/84, pulse 72, temperature 97.9 F (36.6 C), temperature source Oral, resp. rate (!) 21, height 5\' 8"  (1.727 m), weight 94.7 kg, SpO2 96 %.  Intake/Output Summary (Last 24 hours) at 12/20/2021 0948 Last data filed at 12/19/2021 2000 Gross per 24 hour  Intake 657 ml  Output 575 ml  Net 82 ml    Filed Weights   12/19/21 0146  Weight: 94.7 kg    Examination: General: No acute respiratory distress Lungs: Clear to auscultation bilaterally  Cardiovascular: RRR Abdomen: NT/ND, soft, BS positive, no rebound Extremities: No significant cyanosis, clubbing, or edema bilateral lower extremities  CBC: Recent Labs  Lab 12/18/21 1318 12/19/21 0440  WBC 11.4* 11.0*  NEUTROABS 7.4  --   HGB 14.5 13.0  HCT 43.1 38.9*  MCV 90.7 88.4  PLT 284 244    Basic Metabolic Panel: Recent Labs  Lab 12/18/21 1318 12/19/21 0440  NA 139 140  K 5.1 3.6  CL 105 106  CO2 24 23  GLUCOSE 163* 104*  BUN 28* 23  CREATININE 1.10 0.89  CALCIUM 9.6 9.2    GFR: Estimated Creatinine Clearance: 72.6 mL/min (by C-G formula based on SCr of 0.89 mg/dL).  Liver Function Tests: Recent Labs  Lab 12/18/21 1318  AST 36  ALT 10  ALKPHOS 54  BILITOT 1.7*  PROT 7.2  ALBUMIN 3.9     HbA1C: Hgb A1c MFr Bld  Date/Time Value Ref Range Status  04/14/2020 12:38  PM 7.9 (H) 4.8 - 5.6 % Final    Comment:    (NOTE) Pre diabetes:          5.7%-6.4%  Diabetes:              >6.4%  Glycemic control for   <7.0% adults with diabetes   02/17/2018 02:32 AM 8.6 (H) 4.8 - 5.6 % Final    Comment:    (NOTE) Pre diabetes:          5.7%-6.4% Diabetes:              >6.4% Glycemic control for   <7.0% adults with diabetes     CBG: Recent Labs  Lab 12/19/21 0611 12/19/21 1152 12/19/21 1639 12/19/21 2026 12/20/21 0627   GLUCAP 110* 137* 196* 143* 115*     Scheduled Meds:  atorvastatin  20 mg Oral Daily   carbidopa-levodopa  1 tablet Oral TID WC   Carbidopa-Levodopa ER  1 tablet Oral QHS   clopidogrel  75 mg Oral Daily   enoxaparin (LOVENOX) injection  40 mg Subcutaneous Q24H   insulin aspart  0-15 Units Subcutaneous TID WC   lisinopril  20 mg Oral Daily   melatonin  5 mg Oral QHS   metoprolol tartrate  25 mg Oral BID   QUEtiapine  12.5 mg Oral QHS     LOS: 0 days   Cherene Altes, MD [facility] Office  [phone] Pager - Text Page per Shea Evans  If 7PM-7AM, please contact night-coverage per Amion 12/20/2021, 9:48 AM

## 2021-12-21 DIAGNOSIS — I2510 Atherosclerotic heart disease of native coronary artery without angina pectoris: Secondary | ICD-10-CM | POA: Diagnosis present

## 2021-12-21 DIAGNOSIS — G47419 Narcolepsy without cataplexy: Secondary | ICD-10-CM | POA: Diagnosis present

## 2021-12-21 DIAGNOSIS — R441 Visual hallucinations: Secondary | ICD-10-CM | POA: Diagnosis present

## 2021-12-21 DIAGNOSIS — E1151 Type 2 diabetes mellitus with diabetic peripheral angiopathy without gangrene: Secondary | ICD-10-CM | POA: Diagnosis present

## 2021-12-21 DIAGNOSIS — F05 Delirium due to known physiological condition: Secondary | ICD-10-CM | POA: Diagnosis present

## 2021-12-21 DIAGNOSIS — E785 Hyperlipidemia, unspecified: Secondary | ICD-10-CM | POA: Diagnosis present

## 2021-12-21 DIAGNOSIS — Z20822 Contact with and (suspected) exposure to covid-19: Secondary | ICD-10-CM | POA: Diagnosis present

## 2021-12-21 DIAGNOSIS — I110 Hypertensive heart disease with heart failure: Secondary | ICD-10-CM | POA: Diagnosis present

## 2021-12-21 DIAGNOSIS — G20 Parkinson's disease: Secondary | ICD-10-CM | POA: Diagnosis present

## 2021-12-21 DIAGNOSIS — Z8673 Personal history of transient ischemic attack (TIA), and cerebral infarction without residual deficits: Secondary | ICD-10-CM | POA: Diagnosis not present

## 2021-12-21 DIAGNOSIS — E876 Hypokalemia: Secondary | ICD-10-CM | POA: Diagnosis present

## 2021-12-21 DIAGNOSIS — R296 Repeated falls: Secondary | ICD-10-CM | POA: Diagnosis present

## 2021-12-21 DIAGNOSIS — Z87891 Personal history of nicotine dependence: Secondary | ICD-10-CM | POA: Diagnosis not present

## 2021-12-21 DIAGNOSIS — T428X5A Adverse effect of antiparkinsonism drugs and other central muscle-tone depressants, initial encounter: Secondary | ICD-10-CM | POA: Diagnosis present

## 2021-12-21 DIAGNOSIS — I5032 Chronic diastolic (congestive) heart failure: Secondary | ICD-10-CM | POA: Diagnosis present

## 2021-12-21 DIAGNOSIS — G4733 Obstructive sleep apnea (adult) (pediatric): Secondary | ICD-10-CM | POA: Diagnosis present

## 2021-12-21 DIAGNOSIS — R531 Weakness: Secondary | ICD-10-CM | POA: Diagnosis present

## 2021-12-21 DIAGNOSIS — Z800 Family history of malignant neoplasm of digestive organs: Secondary | ICD-10-CM | POA: Diagnosis not present

## 2021-12-21 DIAGNOSIS — E7849 Other hyperlipidemia: Secondary | ICD-10-CM | POA: Diagnosis not present

## 2021-12-21 DIAGNOSIS — W19XXXA Unspecified fall, initial encounter: Secondary | ICD-10-CM | POA: Diagnosis present

## 2021-12-21 DIAGNOSIS — M4856XG Collapsed vertebra, not elsewhere classified, lumbar region, subsequent encounter for fracture with delayed healing: Secondary | ICD-10-CM | POA: Diagnosis present

## 2021-12-21 DIAGNOSIS — Z950 Presence of cardiac pacemaker: Secondary | ICD-10-CM | POA: Diagnosis not present

## 2021-12-21 DIAGNOSIS — Z7902 Long term (current) use of antithrombotics/antiplatelets: Secondary | ICD-10-CM | POA: Diagnosis not present

## 2021-12-21 DIAGNOSIS — I452 Bifascicular block: Secondary | ICD-10-CM | POA: Diagnosis present

## 2021-12-21 DIAGNOSIS — M109 Gout, unspecified: Secondary | ICD-10-CM | POA: Diagnosis present

## 2021-12-21 LAB — MAGNESIUM: Magnesium: 2.3 mg/dL (ref 1.7–2.4)

## 2021-12-21 LAB — BASIC METABOLIC PANEL
Anion gap: 10 (ref 5–15)
BUN: 23 mg/dL (ref 8–23)
CO2: 24 mmol/L (ref 22–32)
Calcium: 8.9 mg/dL (ref 8.9–10.3)
Chloride: 104 mmol/L (ref 98–111)
Creatinine, Ser: 1 mg/dL (ref 0.61–1.24)
GFR, Estimated: >60 mL/min (ref >60)
Glucose, Bld: 136 mg/dL — ABNORMAL HIGH (ref 70–99)
Potassium: 3.3 mmol/L — ABNORMAL LOW (ref 3.5–5.1)
Sodium: 138 mmol/L (ref 135–145)

## 2021-12-21 LAB — GLUCOSE, CAPILLARY
Glucose-Capillary: 134 mg/dL — ABNORMAL HIGH (ref 70–99)
Glucose-Capillary: 137 mg/dL — ABNORMAL HIGH (ref 70–99)
Glucose-Capillary: 141 mg/dL — ABNORMAL HIGH (ref 70–99)
Glucose-Capillary: 183 mg/dL — ABNORMAL HIGH (ref 70–99)

## 2021-12-21 MED ORDER — POTASSIUM CHLORIDE CRYS ER 20 MEQ PO TBCR
40.0000 meq | EXTENDED_RELEASE_TABLET | Freq: Once | ORAL | Status: AC
  Administered 2021-12-21: 40 meq via ORAL
  Filled 2021-12-21: qty 2

## 2021-12-21 NOTE — Evaluation (Signed)
Occupational Therapy Evaluation Patient Details Name: Jordan [name] MRN: [id number] DOB: Sep 07, 1941 Today's Date: 12/21/2021   History of Present Illness Pt is an 81 y/o male presenting to the ED secondary to increased confusion and hallucinations. PMH includes parkinson's disease and unspecified compression fx.   Clinical Impression   Jordan [name] was evaluated s/p the above admission list, he requires some assist from his wife for ADLs at baseline, ambulates with RW and is very limited in mobility with report of several falls. He lives at home with his wife and son, both who can assist 23. Upon evaluation he required min A for sit<>stand and mod A for pivot transfers with RW and verbal cues. He requires up to max A for ADLs and is generally limited by weakness, impaired balance, poor insight into falls and safety and poor FM/dexterity in bilat hands. 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 Recommend d/c to SNF unless pt progresses acutely & family can safety provide   Direct physical assist. OT to follow acutely to address limitations listed below.     Recommendations for follow up therapy are one component of a multi-disciplinary discharge planning process, led by the attending physician.  Recommendations may be updated based on patient status, additional functional criteria and insurance authorization.   Follow Up Recommendations  Skilled nursing-short term rehab (<3 hours/day)    Assistance Recommended at Discharge Frequent or constant Supervision/Assistance  Patient can return home with the following A lot of help with walking and/or transfers;A lot of help with bathing/dressing/bathroom;Direct supervision/assist for medications management;Help with stairs or ramp for entrance;Assist for transportation    Functional Status Assessment  Patient has had a recent decline in their functional status and demonstrates the ability to make significant improvements in  function in a reasonable and predictable amount of time.  Equipment Recommendations  Other (comment) (defer to next venue)    Recommendations for Other Services       Precautions / Restrictions Precautions Precautions: Fall Precaution Comments: Reports multiple falls at home      Mobility Bed Mobility Overal bed mobility: Needs Assistance Bed Mobility: Rolling, Supine to Sit Rolling: Min assist   Supine to sit: Min assist     General bed mobility comments: HOB elevated    Transfers Overall transfer level: Needs assistance Equipment used: Rolling walker (2 wheels) Transfers: Sit to/from Stand, Bed to chair/wheelchair/BSC Sit to Stand: Min assist     Step pivot transfers: Mod assist     General transfer comment: posterior leaning initally, dizziness upon standing, difficulty with RLE movement, constant cues required      Balance Overall balance assessment: Needs assistance, History of Falls Sitting-balance support: Feet supported Sitting balance-Leahy Scale: Fair     Standing balance support: Bilateral upper extremity supported, During functional activity Standing balance-Leahy Scale: Poor                             ADL either performed or assessed with clinical judgement   ADL Overall ADL's : Needs assistance/impaired Eating/Feeding: Set up;Sitting Eating/Feeding Details (indicate cue type and reason): assist with all packages Grooming: Set up;Sitting Grooming Details (indicate cue type and reason): incr time required Upper Body Bathing: Minimal assistance;Sitting   Lower Body Bathing: Maximal assistance;Sit to/from stand   Upper Body Dressing : Set up;Sitting   Lower Body Dressing: Maximal assistance;Sit to/from stand   Toilet Transfer: Moderate assistance;Stand-pivot;BSC/3in1;Rolling walker (2 wheels) Toilet Transfer Details (indicate  cue type and reason): small pivotal steps, cues for sequencing Toileting- Clothing Manipulation and  Hygiene: Maximal assistance;Sit to/from stand       Functional mobility during ADLs: Moderate assistance;Rolling walker (2 wheels) General ADL Comments: unsteady and difficulty moving RLE. dizziness with change in position. requires constant cues and re-direction     Vision Baseline Vision/History: 1 Wears glasses Ability to See in Adequate Light: 0 Adequate Patient Visual Report: No change from baseline Vision Assessment?: No apparent visual deficits Additional Comments: report of hallucinations     Perception     Praxis      Pertinent Vitals/Pain Pain Assessment Pain Assessment: No/denies pain Pain Intervention(s): Monitored during session     Hand Dominance Right   Extremity/Trunk Assessment Upper Extremity Assessment Upper Extremity Assessment: Generalized weakness;RUE deficits/detail RUE Deficits / Details: limited over head ROM, poor dexterity/fine motor, globally weak with 4/5 MMT RUE Sensation: WNL RUE Coordination: decreased fine motor   Lower Extremity Assessment Lower Extremity Assessment: Defer to PT evaluation   Cervical / Trunk Assessment Cervical / Trunk Assessment: Other exceptions Cervical / Trunk Exceptions: wife reports hx of compression fx   Communication Communication Communication: Expressive difficulties   Cognition Arousal/Alertness: Awake/alert Behavior During Therapy: Flat affect Overall Cognitive Status: Impaired/Different from baseline                                 General Comments: Pt with hallucinations and slow processing requires constant re-direction     General Comments  VSS on RA            Home Living Family/patient expects to be discharged to:: Private residence Living Arrangements: Spouse/significant other;Children Available Help at Discharge: Family Type of Home: Apartment Home Access: Elevator     Home Layout: One level     Bathroom Shower/Tub: Tub/shower unit;Walk-in shower   Bathroom  Toilet: Handicapped height     Home Equipment: Other (comment);Shower Land (2 wheels)   Additional Comments: pt & family anticipating d/c to SNF      Prior Functioning/Environment Prior Level of Function : Needs assist       Physical Assist : Mobility (physical);ADLs (physical) Mobility (physical): Transfers;Gait ADLs (physical): Bathing;Dressing;Toileting;IADLs Mobility Comments: Wife has had to provide more physical assist here lately with mobility; uses SPC ADLs Comments: Wife providing assist        OT Problem List: Decreased strength;Decreased range of motion;Decreased activity tolerance;Impaired balance (sitting and/or standing);Decreased coordination;Decreased cognition;Decreased safety awareness;Decreased knowledge of precautions      OT Treatment/Interventions: Self-care/ADL training;Therapeutic exercise;DME and/or AE instruction;Patient/family education;Balance training;Therapeutic activities    OT Goals(Current goals can be found in the care plan section) Acute Rehab OT Goals Patient Stated Goal: walk without walker OT Goal Formulation: With patient Time For Goal Achievement: 01/04/22 Potential to Achieve Goals: Good ADL Goals Pt Will Perform Grooming: with modified independence;standing Pt Will Perform Lower Body Bathing: sit to/from stand;with set-up Pt Will Perform Lower Body Dressing: sit to/from stand;with set-up Pt Will Transfer to Toilet: with supervision;ambulating Pt/caregiver will Perform Home Exercise Program: Increased ROM;Increased strength;Both right and left upper extremity;With Supervision;With written HEP provided  OT Frequency: Min 2X/week       AM-PAC OT "6 Clicks" Daily Activity     Outcome Measure Help from another person eating meals?: A Little Help from another person taking care of personal grooming?: A Little Help from another person toileting, which includes using toliet, bedpan, or urinal?: A  Lot Help from another  person bathing (including washing, rinsing, drying)?: A Lot Help from another person to put on and taking off regular upper body clothing?: A Little Help from another person to put on and taking off regular lower body clothing?: A Lot 6 Click Score: 15   End of Session Equipment Utilized During Treatment: Gait belt;Rolling walker (2 wheels) Nurse Communication: Mobility status  Activity Tolerance: Patient tolerated treatment well Patient left: in chair;with call bell/phone within reach  OT Visit Diagnosis: Unsteadiness on feet (R26.81);Other abnormalities of gait and mobility (R26.89);Muscle weakness (generalized) (M62.81);History of falling (Z91.81)                Time: 1005-1026 OT Time Calculation (min): 21 min Charges:  OT General Charges $OT Visit: 1 Visit OT Evaluation $OT Eval Moderate Complexity: 1 Mod   Shar Paez A Daimien Patmon 12/21/2021, 10:40 AM

## 2021-12-21 NOTE — Progress Notes (Signed)
Pt placed self on home cpap unit.

## 2021-12-21 NOTE — Progress Notes (Signed)
Jordan [name]  KGU:[id number] DOB: Jan 18, 1941 DOA: 12/18/2021 PCP: [name], MD    Brief Narrative:  [phone] with a history of Parkinson's disease, bradycardia status post pacer, DM2, OSA on CPAP, CVA 2021, HTN, and diastolic CHF who presented to the ED with the acute worsening of hallucinations which he had been experiencing since August 2022.  He is followed by Neurology in the outpatient setting and it was felt that his hallucinations were due to his Sinemet.  In the ED CT head was without acute finding.  Consultants:  Psychiatry Neurology  Code Status: FULL CODE  Antimicrobials:  None  DVT prophylaxis: Lovenox  Interim Hx: Afebrile.  Vital signs stable.  Saturation 92% room air.  CBG controlled.  Much more clear in conversation today.  Alert and oriented.  Denies any further hallucinations.  Denies shortness of breath or chest pain.  Does continue to feel weak in general.  Assessment & Plan:  Acute on subacute visual hallucinations with delusions Felt to be due to Sinemet +/- Parkinsons itself - ruled out sources of acute infection - gently hydrated - holding Klonopin and Zoloft - TSH normal - continue trial of seroquel as suggested by Psychiatry - Neurology has evaluated with no new suggestions in regard to treatment of his Parkinson's - avoid Haldol per Neurology recommendation  Frequent falls Due to above plus Parkinson disease - PT/OT suggest SNF rehab stay - MRI cervical spine ordered per Neuro and scheduled for 12/22/2021  Parkinson's disease Followed by Neurology in the outpatient setting -on very low-dose Sinemet - Neuro has evaluated the patient during this hospital stay and suggest continuing his current dose of Sinemet  Lumbar compression fracture Has been undergoing evaluation/planning for kyphoplasty in the outpatient setting -patient and wife ask if possibility of having kyphoplasty while inpatient can be investigated -I have asked IR to comment -if this  relieves a significant prolongation of his hospital stay I would worry about the possibility of acute delirium/sundowning  Hypomagnesemia Corrected with supplementation  Hypokalemia Supplement and follow  Essential HTN Reasonably controlled at present  Hematuria Felt to be due to kidney stones and has previously been evaluated  DM2 CBG controlled at present  Chronic diastolic CHF No clinical evidence of acute exacerbation  HLD Continue usual Lipitor dose  OSA on CPAP Continue usual CPAP regimen  History of CVA Continue usual Plavix and statin    Family Communication:  Disposition: From home - SNF for rehab stay   Objective: Blood pressure (!) 148/90, pulse 72, temperature 98 F (36.7 C), temperature source Oral, resp. rate 16, height 5\' 8"  (1.727 m), weight 94.7 kg, SpO2 92 %.  Intake/Output Summary (Last 24 hours) at 12/21/2021 1017 Last data filed at 12/20/2021 2300 Gross per 24 hour  Intake 360 ml  Output 1025 ml  Net -665 ml    Filed Weights   12/19/21 0146  Weight: 94.7 kg    Examination: General: No acute respiratory distress Lungs: Clear to auscultation bilaterally without wheezing Cardiovascular: RRR without murmur Abdomen: NT/ND, soft, BS positive, no rebound Extremities: No edema bilateral lower extremities  CBC: Recent Labs  Lab 12/18/21 1318 12/19/21 0440  WBC 11.4* 11.0*  NEUTROABS 7.4  --   HGB 14.5 13.0  HCT 43.1 38.9*  MCV 90.7 88.4  PLT 284 151    Basic Metabolic Panel: Recent Labs  Lab 12/18/21 1318 12/19/21 0440 12/20/21 1422 12/21/21 0242  NA 139 140  --  138  K 5.1 3.6  --  3.3*  CL 105 106  --  104  CO2 24 23  --  24  GLUCOSE 163* 104*  --  136*  BUN 28* 23  --  23  CREATININE 1.10 0.89  --  1.00  CALCIUM 9.6 9.2  --  8.9  MG  --   --  1.4* 2.3    GFR: Estimated Creatinine Clearance: 64.7 mL/min (by C-G formula based on SCr of 1 mg/dL).  Liver Function Tests: Recent Labs  Lab 12/18/21 1318  AST 36  ALT  10  ALKPHOS 54  BILITOT 1.7*  PROT 7.2  ALBUMIN 3.9     HbA1C: Hgb A1c MFr Bld  Date/Time Value Ref Range Status  04/14/2020 12:38 PM 7.9 (H) 4.8 - 5.6 % Final    Comment:    (NOTE) Pre diabetes:          5.7%-6.4%  Diabetes:              >6.4%  Glycemic control for   <7.0% adults with diabetes   02/17/2018 02:32 AM 8.6 (H) 4.8 - 5.6 % Final    Comment:    (NOTE) Pre diabetes:          5.7%-6.4% Diabetes:              >6.4% Glycemic control for   <7.0% adults with diabetes     CBG: Recent Labs  Lab 12/20/21 0627 12/20/21 1140 12/20/21 1558 12/20/21 2101 12/21/21 0611  GLUCAP 115* 173* 157* 262* 134*     Scheduled Meds:  atorvastatin  20 mg Oral Daily   carbidopa-levodopa  1 tablet Oral TID WC   Carbidopa-Levodopa ER  1 tablet Oral QHS   clopidogrel  75 mg Oral Daily   enoxaparin (LOVENOX) injection  40 mg Subcutaneous Q24H   insulin aspart  0-15 Units Subcutaneous TID WC   lisinopril  20 mg Oral Daily   melatonin  5 mg Oral QHS   metoprolol tartrate  25 mg Oral BID   omega-3 acid ethyl esters  1 g Oral Daily   QUEtiapine  12.5 mg Oral QHS     LOS: 0 days   Cherene Altes, MD [facility] Office  [phone] Pager - Text Page per Shea Evans  If 7PM-7AM, please contact night-coverage per Amion 12/21/2021, 10:17 AM

## 2021-12-21 NOTE — Consult Note (Signed)
Brief Psychiatry Consult Note  The patient was last seen by the psychiatry service on 2/20. Interim documentation by primary team and nursing staff has been reviewed. At this time, patient is stable and there is no evidence of acute psychiatric disturbance requiring ongoing psychiatric consultation - had seen again primarily due to increased cogwheeling at elbows on 2/19 (feel this is due to variability in timing of exam from Sinemet dosing as absent today). Please see last consult note for full assessment. Final medication recommendations are as follows:  On brief exam today,   Patient was seen this AM ~1hr after sinemet, stated that "I slept not well" (clarifies that he slept fairly well but not as well as first night he got quetiapine when he slept 10+ hours soundly). Patient denied VH and vivid nightmares, stating that the meds are helpful. Reported having some dizziness on position change consistent with symptomatic orthostasis, that has been present for a while and is not worse since start of quetiapine. Briefly reviewed r/b/se discussed in detail before initiation of medication.   Described his mood as "ok I guess, its realizing to changing things that you can change"; discussed serenity prayer with him which he found comforting (previously had endorsed strong [place] faith). Stated his appetite is appropriate. Patient is looking forward to eating at [employer] at some point after dc.  Recalls agitation in ED and delirium, but no episodes since.   Patient denied SI/HI/AVH. Patient was not grossly responding to internal/external stimuli during encounter.   PE: Cogwheel in b/l wrist, limited ROM in hip girgle 2/2 orthopedic issues, no muscular rigidity. Similar to before initiation of quetiapine.   Alert, oriented, and id well on formal cognitive testing. Some difficulty with date - updated on white board.   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   We will sign off at this time. This has been communicated to the primary team. If issues arise in the future, don't hesitate to reconsult the Psychiatry Inpatient Consult Service.   Jordan [name] A Coleby Yett

## 2021-12-21 NOTE — Progress Notes (Signed)
Pt has home CPAP at bedside and had his mask in his hand. He stated sometimes "his nasal passages get dry in the night and he has to take it off" I asked him did he need any help with it at the moment and he said no.

## 2021-12-22 ENCOUNTER — Inpatient Hospital Stay (HOSPITAL_COMMUNITY): Payer: Medicare (Managed Care)

## 2021-12-22 LAB — BASIC METABOLIC PANEL
Anion gap: 8 (ref 5–15)
BUN: 22 mg/dL (ref 8–23)
CO2: 24 mmol/L (ref 22–32)
Calcium: 9.1 mg/dL (ref 8.9–10.3)
Chloride: 107 mmol/L (ref 98–111)
Creatinine, Ser: 1.03 mg/dL (ref 0.61–1.24)
GFR, Estimated: >60 mL/min (ref >60)
Glucose, Bld: 150 mg/dL — ABNORMAL HIGH (ref 70–99)
Potassium: 3.6 mmol/L (ref 3.5–5.1)
Sodium: 139 mmol/L (ref 135–145)

## 2021-12-22 LAB — GLUCOSE, CAPILLARY
Glucose-Capillary: 149 mg/dL — ABNORMAL HIGH (ref 70–99)
Glucose-Capillary: 130 mg/dL — ABNORMAL HIGH (ref 70–99)
Glucose-Capillary: 151 mg/dL — ABNORMAL HIGH (ref 70–99)
Glucose-Capillary: 178 mg/dL — ABNORMAL HIGH (ref 70–99)
Glucose-Capillary: 167 mg/dL — ABNORMAL HIGH (ref 70–99)

## 2021-12-22 LAB — MAGNESIUM: Magnesium: 1.9 mg/dL (ref 1.7–2.4)

## 2021-12-22 NOTE — Progress Notes (Signed)
Jordan [name]  [id number] DOB: June 16, 1941 DOA: 12/18/2021 PCP: [name], MD    Brief Narrative:  [phone] with a history of Parkinson's disease, bradycardia status post pacer, DM2, OSA on CPAP, CVA 2021, HTN, and diastolic CHF who presented to the ED with the acute worsening of hallucinations which he had been experiencing since August 2022.  He is followed by Neurology in the outpatient setting and it was felt that his hallucinations were due to his Sinemet.  In the ED CT head was without acute finding.  Consultants:  Psychiatry Neurology  Code Status: FULL CODE  Antimicrobials:  None  DVT prophylaxis: Lovenox  Interim Hx: Afebrile.  Vital signs stable.  No new complaints today.  In good spirits.  Denies chest pain or shortness of breath.  Scheduled for cervical MRI today.  Assessment & Plan:  Acute on subacute visual hallucinations with delusions Felt to be due to Sinemet +/- Parkinsons itself - ruled out sources of acute infection - gently hydrated - stopped Klonopin and Zoloft - TSH normal - continue trial of seroquel as suggested by Psychiatry - Neurology has evaluated with no new suggestions in regard to treatment of his Parkinson's - avoid Haldol per Neurology recommendation -appears to have stabilized at this time  Frequent falls Due to above plus Parkinson disease - PT/OT suggest SNF rehab stay - MRI cervical spine ordered per Neuro and scheduled for 12/22/2021 -family thus far has not been pleased with SNF choices and is leaning towards taking the patient home  Parkinson's disease Followed by Neurology in the outpatient setting -on very low-dose Sinemet - Neuro has evaluated the patient during this hospital stay and suggest continuing his current dose of Sinemet -MRI cervical spine ordered by neurology to be accomplished today  Lumbar compression fracture Has been undergoing evaluation/planning for kyphoplasty in the outpatient setting - IR has been nice enough to  follow-up with the patient while he was here and is planning to complete this procedure 12/29/2021 -in preparation for this his last dose of Plavix will be 12/23/2021 -it would not be appropriate or safe to keep the patient in the hospital until the time this procedure can be accomplished as he is otherwise fully medically stable  Hypomagnesemia Corrected with supplementation  Hypokalemia Corrected with supplementation  Essential HTN Blood pressure controlled  Hematuria Felt to be due to kidney stones and has previously been evaluated  DM2 CBG controlled at present  Chronic diastolic CHF No clinical evidence of acute exacerbation  HLD Continue usual Lipitor dose  OSA on CPAP Continue usual CPAP regimen  History of CVA Continue usual Plavix and statin    Family Communication: No family present at time of exam today Disposition: From home - SNF for rehab stay versus home at discretion of family -patient has medically stabilized in regard to his presenting complaint of hallucinations -if his cervical spine MRI is without acute findings he will be medically cleared for discharge 12/23/2021  Objective: Blood pressure (!) 155/91, pulse 71, temperature 97.6 F (36.4 C), temperature source Oral, resp. rate 16, height 5\' 8"  (1.727 m), weight 94.7 kg, SpO2 94 %.  Intake/Output Summary (Last 24 hours) at 12/22/2021 0918 Last data filed at 12/22/2021 0537 Gross per 24 hour  Intake --  Output 350 ml  Net -350 ml    Filed Weights   12/19/21 0146  Weight: 94.7 kg    Examination: General: No acute respiratory distress Lungs: Clear to auscultation bilaterally -no wheezing Cardiovascular: RRR without murmur  Abdomen: NT/ND, soft, BS positive, no rebound Extremities: No edema bilateral LE  CBC: Recent Labs  Lab 12/18/21 1318 12/19/21 0440  WBC 11.4* 11.0*  NEUTROABS 7.4  --   HGB 14.5 13.0  HCT 43.1 38.9*  MCV 90.7 88.4  PLT 284 157    Basic Metabolic Panel: Recent Labs   Lab 12/19/21 0440 12/20/21 1422 12/21/21 0242 12/22/21 0149  NA 140  --  138 139  K 3.6  --  3.3* 3.6  CL 106  --  104 107  CO2 23  --  24 24  GLUCOSE 104*  --  136* 150*  BUN 23  --  23 22  CREATININE 0.89  --  1.00 1.03  CALCIUM 9.2  --  8.9 9.1  MG  --  1.4* 2.3 1.9    GFR: Estimated Creatinine Clearance: 62.8 mL/min (by C-G formula based on SCr of 1.03 mg/dL).  Liver Function Tests: Recent Labs  Lab 12/18/21 1318  AST 36  ALT 10  ALKPHOS 54  BILITOT 1.7*  PROT 7.2  ALBUMIN 3.9     HbA1C: Hgb A1c MFr Bld  Date/Time Value Ref Range Status  04/14/2020 12:38 PM 7.9 (H) 4.8 - 5.6 % Final    Comment:    (NOTE) Pre diabetes:          5.7%-6.4%  Diabetes:              >6.4%  Glycemic control for   <7.0% adults with diabetes   02/17/2018 02:32 AM 8.6 (H) 4.8 - 5.6 % Final    Comment:    (NOTE) Pre diabetes:          5.7%-6.4% Diabetes:              >6.4% Glycemic control for   <7.0% adults with diabetes     CBG: Recent Labs  Lab 12/21/21 1201 12/21/21 1733 12/21/21 2125 12/22/21 0619 12/22/21 0804  GLUCAP 137* 141* 183* 149* 130*     Scheduled Meds:  atorvastatin  20 mg Oral Daily   carbidopa-levodopa  1 tablet Oral TID WC   Carbidopa-Levodopa ER  1 tablet Oral QHS   clopidogrel  75 mg Oral Daily   enoxaparin (LOVENOX) injection  40 mg Subcutaneous Q24H   insulin aspart  0-15 Units Subcutaneous TID WC   lisinopril  20 mg Oral Daily   melatonin  5 mg Oral QHS   metoprolol tartrate  25 mg Oral BID   omega-3 acid ethyl esters  1 g Oral Daily   QUEtiapine  12.5 mg Oral QHS     LOS: 1 day   Cherene Altes, MD [facility] Office  [phone] Pager - Text Page per Shea Evans  If 7PM-7AM, please contact night-coverage per Amion 12/22/2021, 9:18 AM

## 2021-12-22 NOTE — Progress Notes (Signed)
Inpatient Rehab Admissions Coordinator:   At request of TOC (who reports family made request), patient was screened for CIR candidacy by Clemens Catholic, MS, CCC-SLP. At this time, Pt. does not appear to demonstrate medical necessity to justify in hospital rehabilitation/CIR. Additionally, he has not attempted out of bed this admission, so it is not clear that Pt. Demonstrates the ability to tolerate the intensity of CIR. I  will not pursue a rehab consult for this Pt.   Recommend other rehab venues to be pursued.  If Pt. Undergoes additional medical procedures and demonstrates  improved tolerance with therapies, MD may place CIR consult order. Please contact me with any questions.  Clemens Catholic, [place], [place] Admissions Coordinator  [phone] ([place]) [phone] (office)

## 2021-12-22 NOTE — Progress Notes (Signed)
Physical Therapy Treatment Patient Details Name: Jordan [name] MRN: [id number] DOB: [birth year] Today's Date: 12/22/2021   History of Present Illness Jordan [name] is a 81 y.o. male admitted for hallucinations, gradually worsening from vivid dreams over the past several years & is most consistent with known diagnosis of Parkinson's disease. PMHx: PD, HLD, OSA, DM, HTN, chronic diastolic HF, CVA, frequent falls, L2 compression fx, L hip fx s/p IM nail    PT Comments    Pt alert and motivated to mobilize today, cognitively appropriate throughout session and even using some humor. Pt able to tolerate >30 mins of therapy in one session. Pt needed min A to come to sitting EOB due to R lean in sitting. Pt ambulated 8' x2 with RW and mod A +2 for chair behind. Maintained R lean with ambulation. Second bout of 8' pt  having more trouble advancing RLE. Worked on numerous sit>stand from chair with increased activation RLE. Expect pt would be able to tolerate AIR between now and kyphoplasty and would gain more independence with higher level therapy. PT will continue to follow.   Recommendations for follow up therapy are one component of a multi-disciplinary discharge planning process, led by the attending physician.  Recommendations may be updated based on patient status, additional functional criteria and insurance authorization.  Follow Up Recommendations  Acute inpatient rehab (3hours/day)     Assistance Recommended at Discharge Frequent or constant Supervision/Assistance  Patient can return home with the following A lot of help with walking and/or transfers;A lot of help with bathing/dressing/bathroom;Assistance with cooking/housework;Assist for transportation;Help with stairs or ramp for entrance;Direct supervision/assist for medications management;Direct supervision/assist for financial management   Equipment Recommendations  Hospital bed;Wheelchair (measurements PT);Wheelchair cushion  (measurements PT)    Recommendations for Other Services Rehab consult     Precautions / Restrictions Precautions Precautions: Fall Precaution Comments: Reports multiple falls at home, some he can get up from with wife assist, some they have had to call EMS for additional support Restrictions Weight Bearing Restrictions: No     Mobility  Bed Mobility Overal bed mobility: Needs Assistance Bed Mobility: Rolling, Sidelying to Sit Rolling: Min guard Sidelying to sit: Min assist       General bed mobility comments: pt able to roll onto L side with vc's. Able to bring LE's off bed and scoot to EOB in SL. Able to initiate SL to sit but needed min A to complete task due to R lean    Transfers Overall transfer level: Needs assistance Equipment used: Rolling walker (2 wheels) Transfers: Sit to/from Stand Sit to Stand: Min assist           General transfer comment: min A for fwd wt shift. At home pt places hands on RW but this keeps wt posterior. Cued pt to push from bed which helped fwd wt shift but sometimes has difficulty moving hands from bed to RW. Needed more assist last 2 sit>stand with fatigue    Ambulation/Gait Ambulation/Gait assistance: Min assist, +2 safety/equipment, Mod assist Gait Distance (Feet): 8 Feet (2x) Assistive device: Rolling walker (2 wheels) Gait Pattern/deviations: Decreased weight shift to left, Trunk flexed Gait velocity: decreased Gait velocity interpretation: <1.31 ft/sec, indicative of household ambulator   General Gait Details: pt with R lean throughout requiring mod A at one point to prevent fall to R. Chair brought behind pt for safety. Had difficulty advancing RLE   Chief Strategy Officer  Modified Rankin (Stroke Patients Only)       Balance Overall balance assessment: Needs assistance, History of Falls Sitting-balance support: Feet supported Sitting balance-Leahy Scale: Fair Sitting balance - Comments:  noted R lean in sitting, pt able to correct with vc's Postural control: Right lateral lean Standing balance support: Bilateral upper extremity supported, During functional activity Standing balance-Leahy Scale: Poor Standing balance comment: heavily reliant on UE and external support                            Cognition Arousal/Alertness: Awake/alert Behavior During Therapy: WFL for tasks assessed/performed Overall Cognitive Status: Impaired/Different from baseline                                 General Comments: pt following commands today, occasionally needs to be given twice. No hallucination during session. Pt making jokes at times        Exercises General Exercises - Lower Extremity Hip Flexion/Marching: AROM, Right, 10 reps, Standing    General Comments General comments (skin integrity, edema, etc.): VSS on RA, wife present. Pt very motivated to mobilize more independently      Pertinent Vitals/Pain Pain Assessment Pain Assessment: Faces Faces Pain Scale: Hurts little more Pain Location: back Pain Descriptors / Indicators: Guarding Pain Intervention(s): Limited activity within patient's tolerance, Monitored during session    Home Living                          Prior Function            PT Goals (current goals can now be found in the care plan section) Acute Rehab PT Goals Patient Stated Goal: for pt to get stronger PT Goal Formulation: With patient/family Time For Goal Achievement: 01/01/22 Potential to Achieve Goals: Good Progress towards PT goals: Progressing toward goals    Frequency    Min 3X/week      PT Plan Discharge plan needs to be updated    Co-evaluation              AM-PAC PT "6 Clicks" Mobility   Outcome Measure  Help needed turning from your back to your side while in a flat bed without using bedrails?: A Little Help needed moving from lying on your back to sitting on the side of a flat bed  without using bedrails?: A Little Help needed moving to and from a bed to a chair (including a wheelchair)?: A Lot Help needed standing up from a chair using your arms (e.g., wheelchair or bedside chair)?: A Lot Help needed to walk in hospital room?: Total Help needed climbing 3-5 steps with a railing? : Total 6 Click Score: 12    End of Session Equipment Utilized During Treatment: Gait belt Activity Tolerance: Patient tolerated treatment well Patient left: in chair;with call bell/phone within reach;with family/visitor present Nurse Communication: Mobility status PT Visit Diagnosis: Other abnormalities of gait and mobility (R26.89);History of falling (Z91.81);Difficulty in walking, not elsewhere classified (R26.2)     Time: 8756-4332 PT Time Calculation (min) (ACUTE ONLY): 37 min  Charges:  $Gait Training: 8-22 mins $Therapeutic Exercise: 8-22 mins                     Leighton Roach, PT  Acute Rehab Services  Pager [phone] Office [place] 12/22/2021, 4:05  PM

## 2021-12-22 NOTE — Progress Notes (Signed)
Pt known to NIR service/Dr. Estanislado Pandy. Seen in consult on 2/14 for symptomatic L2 compression fracture. Kyphoplasty procedure was offered but pt wanted to further discuss with family. Has now been admitted with some hallucinations possibly felt to be medication related, clinically improving.  Pt/family inquired about having KP procedure done while inpt. Chart reviewed and pt on daily Plavix due to hx of CVA.  Plavix will need to be held x 5 days prior to procedure.  We have tentatively put him on our outpt schedule for Tues 2/28 at 0830. He can get his Plavix tomorrow, but hold thereafter. ASA 81mg  daily is okay.   Pt for discharge in the next 1-2 days, home for [facility] to be determined. We will follow along until discharge so we know where he ends up and can communicate with the family and/or SNF as far as pre-procedure instructions.  Ascencion Dike PA-C Interventional Radiology 12/22/2021 3:11 PM

## 2021-12-22 NOTE — Discharge Instructions (Signed)
Neurointerventional Radiology Instructions:  We have tentatively put him on our outpt schedule for Tues 2/28 at 0830. He can get his Plavix tomorrow 2/22, but hold thereafter. ASA 81mg  daily is okay.   Pt for discharge in the next 1-2 days, home for SNF to be determined. We will follow along until discharge so we know where he ends up and can communicate with the family and/or SNF as far as pre-procedure instructions.

## 2021-12-22 NOTE — TOC Progression Note (Signed)
Transition of Care [facility]) - Progression Note    Patient Details  Name: Jordan [name] MRN: [id number] Date of Birth: [birth year]  Transition of Care [facility]) CM/SW [place], [place] Phone Number: 12/22/2021, 2:03 PM  Clinical Narrative:   CSW met with patient earlier today to provide bed offers, and spoke with wife later in the afternoon to discuss, as well. [name] does not accept patient's insurance and [facility] is still reviewing, but patient has offers at [facility] and [facility]. Wife not happy about offers and really only interested in [facility], if they are able to take. Otherwise, wife leaning towards taking the patient home. CSW to follow.    Expected Discharge Plan: [place] Barriers to Discharge: No Barriers Identified  Expected Discharge Plan and Services Expected Discharge Plan: [place] arrangements for the past 2 months: Single Family Home                                       Social Determinants of Health (SDOH) Interventions    Readmission Risk Interventions No flowsheet data found.

## 2021-12-22 NOTE — Telephone Encounter (Signed)
Contacted spouse to check on patient and see how he has been doing. LVM rq call back

## 2021-12-23 ENCOUNTER — Other Ambulatory Visit (HOSPITAL_COMMUNITY): Payer: Self-pay

## 2021-12-23 DIAGNOSIS — E7849 Other hyperlipidemia: Secondary | ICD-10-CM

## 2021-12-23 DIAGNOSIS — E1151 Type 2 diabetes mellitus with diabetic peripheral angiopathy without gangrene: Secondary | ICD-10-CM

## 2021-12-23 DIAGNOSIS — I5032 Chronic diastolic (congestive) heart failure: Secondary | ICD-10-CM

## 2021-12-23 DIAGNOSIS — I10 Essential (primary) hypertension: Secondary | ICD-10-CM

## 2021-12-23 DIAGNOSIS — G20 Parkinson's disease: Secondary | ICD-10-CM

## 2021-12-23 LAB — GLUCOSE, CAPILLARY
Glucose-Capillary: 123 mg/dL — ABNORMAL HIGH (ref 70–99)
Glucose-Capillary: 175 mg/dL — ABNORMAL HIGH (ref 70–99)

## 2021-12-23 MED ORDER — QUETIAPINE FUMARATE 25 MG PO TABS
12.5000 mg | ORAL_TABLET | Freq: Every day | ORAL | 0 refills | Status: DC
  Filled 2021-12-23: qty 15, 30d supply, fill #0

## 2021-12-23 NOTE — Hospital Course (Signed)
Jordan [name] was admitted to the hospital with the working diagnosis of acute to subacute visual hallucinations.   81 yo male with the past medical history of Parkinson's disease, bradycardia sp pacer, CVA, hypertension, and diastolic heart failure who was admitted with fall and hallucinations. Patient had ongoing symptoms since August 2022, it was felt that hallucinations were due to Sinemet.  but more sever for the last few days, positive falls over last 24 hours prior to hospitalization, he was aware of hallucinations. Because frequent falls he has brought to the hospital. On his initial physical examination his blood pressure was 130/78, HR 77, RR 20 and oxygen saturation 94% on room air. Lungs were clear to auscultation, heart with S1 and S2 present and rhythmic, abdomen soft and non lower extremity edema, patient was alert and awake with no focal neurologic deficit.   Na 139, K 5,1 Cl 105, bicarbonate 24, glucose 163, bun 28 and cr 1,10 Wbc 11.4, hgb 14,5, hct 43.1 plt 284  SARS COVID 19 negative.   Urine analysis with SG 1,026, negative for leukocytes.   Head CT with no acute changes. Cerebral atrophy. Chronic lacunar infarcts and bilateral thalami.   Chest radiograph with no infiltrates.   EKG 66 bpm, left axis, with left anterior fascicular block and right bundle branch block, a paced with no ST segment or T wave significant changes.   Patient was placed on IV fluids, and his medications were adjusted.  Neurology and Psychiatry were consulted.  Further work up with cervical spine MRI with no acute changes. Patient will follow up as outpatient with IR for kyphoplasty.

## 2021-12-23 NOTE — Progress Notes (Signed)
Occupational Therapy Treatment Patient Details Name: Jordan [name] MRN: [id number] DOB: 1941-03-02 Today's Date: 12/23/2021   History of present illness Jordan [name] is a 81 y.o. male admitted for hallucinations, gradually worsening from vivid dreams over the past several years & is most consistent with known diagnosis of Parkinson's disease. PMHx: PD, HLD, OSA, DM, HTN, chronic diastolic HF, CVA, frequent falls, L2 compression fx, L hip fx s/p IM nail   OT comments  Jordan [name] is progressing well with plans to d/c home today with support of his wife & [place] services. Overall he required min g for sit<>stands with cues for hand placement, set up for upper ADLs and max A for lower ADLs. His wife verbalized good understanding of safe transfer techniques and fall prevention strategies. D/c recommendation updated to home health as CIR denied pt for medical necessity and pt's wife is declining SNF.     Recommendations for follow up therapy are one component of a multi-disciplinary discharge planning process, led by the attending physician.  Recommendations may be updated based on patient status, additional functional criteria and insurance authorization.    Follow Up Recommendations  Home health OT    Assistance Recommended at Discharge Frequent or constant Supervision/Assistance  Patient can return home with the following  A lot of help with walking and/or transfers;A lot of help with bathing/dressing/bathroom;Direct supervision/assist for medications management;Help with stairs or ramp for entrance;Assist for transportation   Equipment Recommendations  Other (comment)       Precautions / Restrictions Precautions Precautions: Fall Precaution Comments: Reports multiple falls at home, some he can get up from with wife assist, some they have had to call EMS for additional support Restrictions Weight Bearing Restrictions: No       Mobility Bed Mobility               General bed mobility  comments: in chair upon arrival    Transfers Overall transfer level: Needs assistance Equipment used: Rolling walker (2 wheels) Transfers: Sit to/from Stand Sit to Stand: Min guard           General transfer comment: several sit<>stand during ADLs, min G with cues for hand placement     Balance Overall balance assessment: Needs assistance, History of Falls Sitting-balance support: Feet supported Sitting balance-Leahy Scale: Fair Sitting balance - Comments: noted R lean in sitting, pt able to correct with vc's Postural control: Right lateral lean Standing balance support: Bilateral upper extremity supported, During functional activity Standing balance-Leahy Scale: Poor Standing balance comment: heavily reliant on UE and external support                           ADL either performed or assessed with clinical judgement   ADL Overall ADL's : Needs assistance/impaired                 Upper Body Dressing : Set up;Sitting Upper Body Dressing Details (indicate cue type and reason): incr time and cues Lower Body Dressing: Maximal assistance;Sit to/from stand Lower Body Dressing Details (indicate cue type and reason): incr time & cues. pt able to thread RLE but not L             Functional mobility during ADLs: Moderate assistance;Rolling walker (2 wheels) General ADL Comments: min G for sit<>stand, mod A for ambulation. limited ADLs due to endruacne, posterior bias, weakness and cog    Extremity/Trunk Assessment Upper Extremity Assessment RUE Deficits / Details: limited  over head ROM, poor dexterity/fine motor, globally weak with 4/5 MMT RUE Sensation: WNL RUE Coordination: decreased fine motor   Lower Extremity Assessment Lower Extremity Assessment: Defer to PT evaluation        Vision   Vision Assessment?: No apparent visual deficits   Perception Perception Perception: Not tested   Praxis Praxis Praxis: Not tested    Cognition  Arousal/Alertness: Awake/alert Behavior During Therapy: WFL for tasks assessed/performed Overall Cognitive Status: Impaired/Different from baseline                                 General Comments: pt following commands today, occasionally needs to be given twice. No hallucination during session.              General Comments VSS on RA. Wife present and supportive but verbalized being unhappy that the pt is not going to IPR    Pertinent Vitals/ Pain       Pain Assessment Pain Assessment: Faces Faces Pain Scale: Hurts little more Pain Location: back Pain Descriptors / Indicators: Guarding Pain Intervention(s): Limited activity within patient's tolerance, Monitored during session   Frequency  Min 2X/week        Progress Toward Goals  OT Goals(current goals can now be found in the care plan section)  Progress towards OT goals: Progressing toward goals  Acute Rehab OT Goals Patient Stated Goal: home OT Goal Formulation: With patient Time For Goal Achievement: 01/04/22 Potential to Achieve Goals: Good ADL Goals Pt Will Perform Grooming: with modified independence;standing Pt Will Perform Lower Body Bathing: sit to/from stand;with set-up Pt Will Perform Lower Body Dressing: sit to/from stand;with set-up Pt Will Transfer to Toilet: with supervision;ambulating Pt/caregiver will Perform Home Exercise Program: Increased ROM;Increased strength;Both right and left upper extremity;With Supervision;With written HEP provided  Plan Discharge plan needs to be updated       AM-PAC OT "6 Clicks" Daily Activity     Outcome Measure   Help from another person eating meals?: A Little Help from another person taking care of personal grooming?: A Little Help from another person toileting, which includes using toliet, bedpan, or urinal?: A Lot Help from another person bathing (including washing, rinsing, drying)?: A Lot Help from another person to put on and taking off  regular upper body clothing?: A Little Help from another person to put on and taking off regular lower body clothing?: A Lot 6 Click Score: 15    End of Session Equipment Utilized During Treatment: Gait belt;Rolling walker (2 wheels)  OT Visit Diagnosis: Unsteadiness on feet (R26.81);Other abnormalities of gait and mobility (R26.89);Muscle weakness (generalized) (M62.81);History of falling (Z91.81)   Activity Tolerance Patient tolerated treatment well   Patient Left in chair;with call bell/phone within reach   Nurse Communication Mobility status        Time: 7121-9758 OT Time Calculation (min): 24 min  Charges: OT General Charges $OT Visit: 1 Visit OT Treatments $Self Care/Home Management : 8-22 mins   Izabell Schalk A Hutchinson [place] 12/23/2021, 5:27 PM

## 2021-12-23 NOTE — Progress Notes (Signed)
Inpatient Rehab Admissions Coordinator:   I spoke with Pt.'s wife over the phone and notified her that both myself and rehab MD reviewed Pt.'s case and do not think that pt. Meets medical necessity criteria for CIR admission. I am not going to pursue an admission for this Pt. I notified TOC and MD. CIR will sign off.   Clemens Catholic, [place], [place] Admissions Coordinator  [phone] ([place]) [phone] (office)

## 2021-12-23 NOTE — Progress Notes (Signed)
Progress Note   Patient: Jordan [name] NTI:[id number] DOB: 1941-08-06 DOA: 12/18/2021     2 DOS: the patient was seen and examined on 12/23/2021   Brief hospital course: Mr. Kapur was admitted to the hospital with the working diagnosis of acute to subacute visual hallucinations.   81 yo male with the past medical history of Parkinson's disease, bradycardia sp pacer, CVA, hypertension, and diastolic heart failure who was admitted with fall and hallucinations. Patient had ongoing symptoms since August 2022, it was felt that hallucinations were due to Sinemet.  but more sever for the last few days, positive falls over last 24 hours prior to hospitalization, he was aware of hallucinations. Because frequent falls he has brought to the hospital. On his initial physical examination his blood pressure was 130/78, HR 77, RR 20 and oxygen saturation 94% on room air. Lungs were clear to auscultation, heart with S1 and S2 present and rhythmic, abdomen soft and non lower extremity edema, patient was alert and awake with no focal neurologic deficit.   Na 139, K 5,1 Cl 105, bicarbonate 24, glucose 163, bun 28 and cr 1,10 Wbc 11.4, hgb 14,5, hct 43.1 plt 284  SARS COVID 19 negative.   Urine analysis with SG 1,026, negative for leukocytes.   Head CT with no acute changes. Cerebral atrophy. Chronic lacunar infarcts and bilateral thalami.   Chest radiograph with no infiltrates.   EKG 66 bpm, left axis, with left anterior fascicular block and right bundle branch block, a paced with no ST segment or T wave significant changes.   Patient was placed on IV fluids, and his medications were adjusted.  Neurology and Psychiatry were consulted.  Further work up with cervical spine MRI with no acute changes. Patient will follow up as outpatient with IR for kyphoplasty.   Assessment and Plan: * Hallucinations, visual- (present on admission) Patient is awake and alert, he is following commands and answering to  questions. Continue to be very weak and deconditioned.   He is not candidate for inpatient rehab. Cervical spine MRI with no acute changes.  Plan to continue with quetiapine daily.   Parkinson disease ([place])- (present on admission) Patient will continue with Sinemet and follow up with neurology as outpatient.   HLD (hyperlipidemia)- (present on admission) Plan to continue with atorvastatin.   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   DM (diabetes mellitus), type 2 with peripheral vascular complications ([place])- (present on admission) a1c a year ago and over 7.9, uncontrolled T2DM. His glucose has been 175 mg/dl Plan to discontinue insulin therapy and will resume metformin.   Benign essential HTN- (present on admission) Continue blood pressure control with metoprolol and  lisinopril   Chronic diastolic heart failure (HCC)- (present on admission) Echo 04/2020:  LVEF 65 to 70%, no evidence of acute focal wall motion abnormalities, mild concentric LVH, grade 2 diastolic dysfunction, mildly dilated left atrium, no evidence of significant valvular pathology.  No clinical signs of exacerbation, continue blood pressure monitoring.   Frequent falls Follow up with TOC for further disposition, he has been found not candidate for CIR.   History of CVA (cerebrovascular accident) Continue blood pressure control and antiplatelet therapy with aspirin and clopidogrel.         Subjective: Patient with no chest pain or dyspnea, no confusion or agitation   Physical Exam: Vitals:   12/22/21 2320 12/23/21 0407 12/23/21 0746 12/23/21 1140  BP: (!) 165/90 134/74 (!) 156/96 117/71  Pulse: (!) 42 62 65  66  Resp: 17 17 17 16   Temp: 98.1 F (36.7 C) 97.6 F (36.4 C) 98.5 F (36.9 C) 98.6 F (37 C)  TempSrc: Oral Oral Oral Oral  SpO2: 94% 95% 96% 94%  Weight:      Height:       Neurology awake and alert ENT with no pallor Cardiovascular with S1 and S2 present and rhythmic, no  murmurs No JVD No lower extremity edema Respiratory with no wheezing or rhonchi Abdomen soft and non tender  Data Reviewed:    Family Communication: no family at the bedside   Disposition: Status is: Inpatient Remains inpatient appropriate because: possible dc home today with home health      Planned Discharge Destination: Home   Author: Tawni Millers, MD 12/23/2021 12:41 PM  For on call review [email or web address].

## 2021-12-23 NOTE — TOC Progression Note (Signed)
Transition of Care [facility]) - Progression Note    Patient Details  Name: Jordan [name] MRN: [id number] Date of Birth: [birth year]  Transition of Care [facility]) CM/SW [place], [place] Phone Number: 12/23/2021, 3:43 PM  Clinical Narrative:     CSW received call from [facility] IR liaison who informed CSW that they cannot accept pt as pt has no identifiable medical necessity to qualify for IR level of care.   Expected Discharge Plan: [place] Barriers to Discharge: Barriers Resolved  Expected Discharge Plan and Services Expected Discharge Plan: [place] arrangements for the past 2 months: Single Family Home Expected Discharge Date: 12/23/21                         HH Arranged: PT, OT, Speech Therapy HH Agency: Other - See comment ([place]) Date [place]: 12/23/21   Representative spoke with at [place]: [place] ([place]) Interventions    Readmission Risk Interventions No flowsheet data found.

## 2021-12-23 NOTE — Plan of Care (Signed)
MRI cervical spine: 1. 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 2. At C4-C5, mild canal and bilateral foraminal stenosis. 3. Additional mild foraminal stenosis on the left at C2-C3 and C6-C7 and bilaterally at C3-C4.  No change to prior Neurological assessment/recommendations as documented in Dr. Lyn Records note from 2/19.  Neurohospitalist service will sign off. Please call if there are additional questions.   Electronically signed: Dr. Kerney Elbe

## 2021-12-23 NOTE — Discharge Summary (Addendum)
Physician Discharge Summary   Patient: Jordan [name] MRN: [id number] DOB: September 21, 1941  Admit date:     12/18/2021  Discharge date: 12/23/21  Discharge Physician: Jordan [name]   PCP: Jordan Frees, MD   Recommendations at discharge:    Patient will continue taking Sinemet 3 times per day Started on quetiapine with good toleration Continue holding clopidogrel in preparation for outpatient kyphoplasty   I spoke over the phone with the patient's wife about patient's  condition, plan of care, prognosis and all questions were addressed.   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 was admitted to the hospital with the working diagnosis of acute to subacute visual hallucinations.   81 yo male with the past medical history of Parkinson's disease, bradycardia sp pacer, CVA, hypertension, and diastolic heart failure who was admitted with fall and hallucinations. Patient had ongoing symptoms since August 2022, it was felt that hallucinations were due to Sinemet.  but more sever for the last few days, positive falls over last 24 hours prior to hospitalization, he was aware of hallucinations. Because frequent falls he has brought to the hospital. On his initial physical examination his blood pressure was 130/78, HR 77, RR 20 and oxygen saturation 94% on room air. Lungs were clear to auscultation, heart with S1 and S2 present and rhythmic, abdomen soft and non lower extremity edema, patient was alert and awake with no focal neurologic deficit.   Na 139, K 5,1 Cl 105, bicarbonate 24, glucose 163,  bun 28 and cr 1,10 Wbc 11.4, hgb 14,5, hct 43.1 plt 284  SARS COVID 19 negative.   Urine analysis with SG 1,026, negative for leukocytes.   Head CT with no acute changes. Cerebral atrophy. Chronic lacunar infarcts and bilateral thalami.   Chest radiograph with no infiltrates.   EKG 66 bpm, left axis, with left anterior fascicular block and right bundle branch block, a paced with no ST segment or T wave significant changes.   Patient was placed on IV fluids, and his medications were adjusted.  Neurology and Psychiatry were consulted.  Further work up with cervical spine MRI with no acute changes. Patient will follow up as outpatient with IR for kyphoplasty.   Assessment and Plan: * Hallucinations, visual- (present on admission) Patient is awake and alert, he is following commands and answering to questions. Continue to be very weak and deconditioned.   He is not candidate for inpatient rehab. Cervical spine MRI with no acute changes.  Plan to continue with quetiapine daily.   Parkinson disease ([place])- (present on admission) Patient will continue with Sinemet and follow up with neurology as outpatient.   HLD (hyperlipidemia)- (present on admission) Plan to continue with atorvastatin.   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   DM (diabetes mellitus), type 2 with peripheral vascular complications ([place])- (present on admission) a1c a year ago and over 7.9, uncontrolled T2DM. His glucose has been 175 mg/dl Plan to discontinue insulin therapy and will resume metformin.   Benign essential HTN- (present on admission) Continue blood pressure control with metoprolol and  lisinopril   Chronic diastolic heart  failure ([place])- (present on admission) Echo 04/2020:  LVEF 65 to 70%, no evidence of acute focal wall motion abnormalities, mild concentric LVH, grade 2 diastolic dysfunction, mildly dilated left atrium, no evidence of significant valvular pathology.  No clinical signs of  exacerbation, continue blood pressure monitoring.   Frequent falls Follow up with TOC for further disposition, he has been found not candidate for CIR.   History of CVA (cerebrovascular accident) Continue blood pressure control and antiplatelet therapy with aspirin and clopidogrel.            Consultants: neurology  Procedures performed: none   Disposition: Home Diet recommendation:  Discharge Diet Orders (From admission, onward)     Start     Ordered   12/23/21 0000  Diet - low sodium heart healthy        12/23/21 1342           Carb modified diet  DISCHARGE MEDICATION: Allergies as of 12/23/2021   No Known Allergies      Medication List     STOP taking these medications    clonazePAM 0.5 MG tablet Commonly known as: KLONOPIN   clopidogrel 75 MG tablet Commonly known as: PLAVIX   sertraline 25 MG tablet Commonly known as: Zoloft       TAKE these medications    acetaminophen 500 MG tablet Commonly known as: TYLENOL Take 1,000 mg by mouth 2 (two) times daily.   amphetamine-dextroamphetamine 10 MG tablet Commonly known as: ADDERALL Take 10 mg by mouth daily as needed (help control symptoms of parkinsons).   atorvastatin 40 MG tablet Commonly known as: LIPITOR Take 0.5 tablets (20 mg total) by mouth daily.   bifidobacterium infantis capsule Take 1 capsule by mouth daily.   carbidopa-levodopa 25-100 MG tablet Commonly known as: Sinemet Take 1 tablet by mouth 3 (three) times daily. What changed: Another medication with the same name was removed. Continue taking this medication, and follow the directions you see here.   cholecalciferol 25 MCG (1000 UNIT) tablet Commonly known as: VITAMIN D3 Take 1,000 Units by mouth daily.   CO Q-10 PO Take 1 capsule by mouth daily.   lidocaine 5 % Commonly known as: LIDODERM Place 1 patch onto the skin in the morning and at bedtime.   lisinopril 10 MG tablet Commonly known as: ZESTRIL Take 1 tablet  (10 mg total) by mouth daily. What changed: how much to take   Melatonin 10 MG Tabs Take 10 mg by mouth at bedtime.   metFORMIN 500 MG tablet Commonly known as: GLUCOPHAGE Take 500 mg by mouth. 4 tables once a day   metoprolol tartrate 25 MG tablet Commonly known as: LOPRESSOR Take 1 tablet (25 mg total) by mouth 2 (two) times daily.   multivitamin with minerals Tabs tablet Take 1 tablet by mouth daily.   Nitrostat 0.4 MG SL tablet Generic drug: nitroGLYCERIN PLACE 1 TABLET (0.4 MG TOTAL) UNDER THE TONGUE EVERY 5 (FIVE) MINUTES AS NEEDED FOR CHEST PAIN. What changed: how much to take   Omega 3 1200 MG Caps Take 1,200 mg by mouth daily.   PRESCRIPTION MEDICATION See admin instructions. CPAP- At bedtime   QUEtiapine 25 MG tablet Commonly known as: SEROQUEL Take 0.5 tablets (12.5 mg total) by mouth at bedtime.   traMADol 50 MG tablet Commonly known as: ULTRAM Take 50 mg by mouth every 6 (six) hours as needed for moderate pain (lower back pain).        Follow-up Information  [facility] Home Follow up.   Why: Representative from [facility] will contact you to schedule start of home health services. Contact information: [address] [place] [address] [phone]                 Discharge Exam: Filed Weights   12/19/21 0146  Weight: 94.7 kg   BP 117/71 (BP Location: Left Arm)    Pulse 66    Temp 98.6 F (37 C) (Oral)    Resp 16    Ht 5\' 8"  (1.727 m)    Wt 94.7 kg    SpO2 94%    BMI 31.74 kg/m   Neurology awake and alert ENT with no pallor Cardiovascular with S1 and S2 present and rhythmic, no murmurs No JVD No lower extremity edema Respiratory with no wheezing or rhonchi Abdomen soft and non tender  Condition at discharge: stable  The results of significant diagnostics from this hospitalization (including imaging, microbiology, ancillary and laboratory) are listed below for reference.   Imaging Studies: CT Head Wo  Contrast  Result Date: 12/18/2021 CLINICAL DATA:  Neuro deficit, acute stroke suspected. EXAM: CT HEAD WITHOUT CONTRAST TECHNIQUE: Contiguous axial images were obtained from the base of the skull through the vertex without intravenous contrast. RADIATION DOSE REDUCTION: This exam was performed according to the departmental dose-optimization program which includes automated exposure control, adjustment of the mA and/or kV according to patient size and/or use of iterative reconstruction technique. COMPARISON:  CT head dated June 30, 2021 FINDINGS: Brain: No evidence of acute infarction, hemorrhage, hydrocephalus, extra-axial collection or mass lesion/mass effect. Moderate cerebral atrophy and chronic microvascular ischemic changes of the white matter, unchanged. Bilateral chronic thalamic lacunar infarcts. Vascular: No hyperdense vessel or unexpected calcification. Skull: Normal. Negative for fracture or focal lesion. Sinuses/Orbits: Partial opacification of the left maxillary sinus, complete opacification of the left frontal sinus and patchy opacification of ethmoid air cells. Other: None. IMPRESSION: 1.  No acute intracranial abnormality. 2. Cerebral atrophy, white matter ischemic changes and chronic lacunar infarcts of bilateral thalami. 3.  Chronic paranasal sinus disease. Electronically Signed   By: Keane Police D.O.   On: 12/18/2021 14:19   MR CERVICAL SPINE WO CONTRAST  Result Date: 12/22/2021 CLINICAL DATA:  Myelopathy, chronic, cervical spine EXAM: MRI CERVICAL SPINE WITHOUT CONTRAST TECHNIQUE: Multiplanar, multisequence MR imaging of the cervical spine was performed. No intravenous contrast was administered. COMPARISON:  None. FINDINGS: Alignment: Reversal of the normal cervical lordosis. Mild retrolisthesis of C5 on C6. Vertebrae: Vertebral body heights are maintained. No focal marrow edema to suggest acute fracture discitis/osteomyelitis. Cord: Normal cord signal. Posterior Fossa, vertebral  arteries, paraspinal tissues: Visualized vertebral artery flow voids are maintained. No evidence of acute abnormality in the visualized posterior fossa on limited assessment. Disc levels: C2-C3: Suspected partial osseous fusion across the disc space. No significant disc protrusion or canal stenosis. Left greater than right facet hypertrophy with mild left foraminal stenosis. Patent right foramen. C3-C4: Suspected partial osseous fusion across the disc space. Bilateral facet and uncovertebral hypertrophy. Mild bilateral foraminal stenosis. No significant canal stenosis. C4-C5: Posterior disc osteophyte complex with bilateral facet and uncovertebral hypertrophy. Resulting mild canal and bilateral foraminal stenosis. C5-C6: Posterior disc osteophyte complex with ligamentum flavum thickening. Resulting mild to moderate canal stenosis and moderate bilateral foraminal stenosis. C6-C7: Small posterior disc osteophyte complex with bilateral facet hypertrophy. Mild left foraminal stenosis without significant canal or right foraminal stenosis. C7-T1: No significant disc protrusion, foraminal stenosis, or canal  stenosis. IMPRESSION: 1. 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 2. At C4-C5, mild canal and bilateral foraminal stenosis. 3. Additional mild foraminal stenosis on the left at C2-C3 and C6-C7 and bilaterally at C3-C4. Electronically Signed   By: Margaretha Sheffield M.D.   On: 12/22/2021 14:08   DG Chest Port 1 View  Result Date: 12/18/2021 CLINICAL DATA:  Shortness of breath. EXAM: PORTABLE CHEST 1 VIEW COMPARISON:  06/21/2020. FINDINGS: Trachea is midline. Heart is enlarged. Thoracic aorta is calcified. Pacemaker lead tips are in the right atrium and right ventricle. Lungs are somewhat low in volume with minimal left basilar subsegmental volume loss. No pleural fluid. IMPRESSION: No acute findings. Electronically Signed   By: Lorin Picket M.D.   On: 12/18/2021 14:34    IR Radiologist Eval & Mgmt  Result Date: 12/15/2021 EXAM: NEW PATIENT OFFICE VISIT CHIEF COMPLAINT: Low back pain. Current Pain Level: 1-10 HISTORY OF PRESENT ILLNESS: History obtained from the patient, his spouse, and also the electronic medical records. 81 year old right handed gentleman referred for evaluation and management of worsening low back pain, seen on the MRI of the lumbosacral spine of November 19, 2021. According to the patient and the spouse the patient's low intensive intermittent chronic low back pain worsened some time and subsequent to ischemic stroke which left him weak in the right arm and leg and also associated with difficulty with balance. Since that time the patient has been having multiple of falls, soft and hard. He reports around July 31, 2021, the patient apparently fell on his buttocks following which he started experiencing severe low back pain. An MRI done on January 19th did show an inferior endplate compression fracture at L2. 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 Patient has been undergoing intensive rehab, but continues to have significant debilitating pain in the low back region especially with prolonged standing and walking. The patient reports this as an 8/10 when severe. He has to sit down for pain relief. Pain relieved some with over the counter Tylenol. He has also been prescribed a lidocaine patch in the lumbar region with modest relief. At the present, the patient reports no acute radiculopathy associated with the pain. Patient ambulates with a walker due to pain and also due to sequela of his stroke. Pain does not interfere with the patient's sleeping pattern. His appetite is normal. He eats regular meals. He denies any symptoms of dysuria, hematuria, or of polyuria. Denies any autonomic dysfunction of bowel or bladder. Denies recent chest pain, shortness of  breath, palpitation, coughing, wheezing or hemoptysis. He denies any abdominal pain, difficulty swallowing, constipation, diarrhea or melena. Denies recent chills, fever or rigors. Patient lives with his spouse. He and she feel significant debilitation related to the pain especially on standing and walking. Diagnosis * : Date . * : Arthritis * : * : R shoulder, bone spur . * : Arthritis * : * : "hands" (03/25/2016) . * : Chronic diastolic heart failure (HCC) * : . * : Chronic lower back pain * : . * : Coronary heart disease * : * : Dr Pernell Dupre . * : H/O cardiovascular stress test * : * : perhaps last one was 2009 . * : Heart murmur * : * : 12/29/11 echo: mild MR, no AS, trivial TR . * : History of gout * : . * : Hyperlipidemia * : . * :  Hypertension * : * : saw Dr. Linard Millers last earl;y- 2014, cardiac cath. last done ?2009, blocks seen didn't require any intervention at that point. . * : Narcolepsy * : * : [name] 04-11-97; Mean Latency 1.60min, SOREM 2 . * : OSA on CPAP * : * : NPSG 12-13-98 AHI 22.7 . * : PONV (postoperative nausea and vomiting) * : . * : Presence of permanent cardiac pacemaker * : . * : Shortness of breath * : * : with exertion . * : Stroke ([place]) * : . * : Type II diabetes mellitus ([place]) * : PSH: Past Surgical History: Procedure * : Laterality * : Date . * : CARDIAC CATHETERIZATION * : * : 2009? [name] * : EP IMPLANTABLE DEVICE * : N/A * : 03/25/2016 * : Procedure: Pacemaker Implant - Dual Chamber; Surgeon: Evans Lance, MD; Location: [facility]; Service: Cardiovascular; Laterality: N/A; . * : FRACTURE SURGERY * : * : . * : INSERT / REPLACE / REMOVE PACEMAKER * : * : 03/24/2016 . * : LEFT AND RIGHT HEART CATHETERIZATION WITH CORONARY ANGIOGRAM * : N/A * : 06/05/2014 * : Procedure: LEFT AND RIGHT HEART CATHETERIZATION WITH CORONARY ANGIOGRAM; Surgeon: Sinclair Grooms, MD; Location: [facility]; Service: Cardiovascular; Laterality: N/A; . * : NASAL FRACTURE SURGERY * : * : . * : SHOULDER ARTHROSCOPY  WITH ROTATOR CUFF REPAIR AND SUBACROMIAL DECOMPRESSION * : Right * : 08/16/2013 * : Procedure: SHOULDER ARTHROSCOPY WITH ROTATOR CUFF REPAIR AND SUBACROMIAL DECOMPRESSION; Surgeon: [name], MD; Location: [facility]; Service: Orthopedics; Laterality: Right; Right shoulder arthroscopy rotator cuff repair, subacromial decompression. . * : TONSILLECTOMY * : * : Socioeconomic History . * : Marital status: * : Married * : * : Spouse name: * : Kelly . * : Number of children: * : Not on file . * : Years of education: * : Not on file . * : Highest education level: * : Not on file Occupational History . 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 * : Occupation: * : [place] [employer] Tobacco Use . * : Smoking status: * : Former * : * : Packs/day: * : 2.50 * : * : Years: * : 6.00 * : * : Pack years: * : 15.00 * : * : Types: * : Cigarettes * : * : Quit date: * : 06/05/1967 * : * : Years since quitting: * : 54.5 . * : Smokeless tobacco: * : Never Vaping Use . * : Vaping Use: * : Never used Substance and Sexual Activity . * : Alcohol use: * : Yes * : * : Comment: rare . * : Drug use: * : No . * : Sexual activity: * : Not Currently Other Topics * : Concern . * : Not on file Social History Narrative * : Lives with wife and son * : Right handed * : Drinks 1 cups caffeine daily Food Insecurity: Not on file Transportation Needs: Not on file Physical Activity: Not on file Stress: Not on file Social Connections: Not on file Intimate Partner Violence: Not on file Family History: Family History Problem * : Relation * : Age of Onset . * : Sleep apnea * : Sister * : . * : Colon cancer * : Sister * : Medications: Current Outpatient Medications on File Prior to Visit Medication * : Sig * : Dispense * : Refill . * : acetaminophen (TYLENOL) 500 MG  tablet * : Take 1,000 mg by mouth 2 (two) times daily. * : * : . * : atorvastatin (LIPITOR) 40 MG tablet * : Take 0.5 tablets (20 mg total) by mouth daily. * : 30 tablet * : 0 . * :  carbidopa-levodopa (SINEMET) 25-100 MG tablet * : Take 1 tablet by mouth 3 (three) times daily. * : 90 tablet * : 5 . * : Carbidopa-Levodopa ER (SINEMET CR) 25-100 MG tablet controlled release * : Take 1 tablet by mouth at bedtime. * : 30 tablet * : 5 . * : cholecalciferol (VITAMIN D3) 25 MCG (1000 UNIT) tablet * : Take 1,000 Units by mouth daily. * : * : . * : clonazePAM (KLONOPIN) 0.5 MG tablet * : TAKE 1 TABLET BY MOUTH EVERY DAY AT BEDTIME AS NEEDED FOR SLEEP * : 30 tablet * : 5 . * : clopidogrel (PLAVIX) 75 MG tablet * : Take 1 tablet (75 mg total) by mouth daily. * : 30 tablet * : 0 . * : Coenzyme Q10 (CO Q-10 PO) * : Take 1 capsule by mouth daily. * : * : . * : lidocaine (LIDODERM) 5 % * : Lidoderm 5 % topical patch APPLY 1 PATCH BY TOPICAL ROUTE ONCE DAILY (MAY WEAR UP TO 12 HOURS.) * : * : . * : lisinopril (ZESTRIL) 10 MG tablet * : Take 1 tablet (10 mg total) by mouth daily. (Patient taking differently: Take 20 mg by mouth daily.) * : 30 tablet * : 0 . * : Melatonin 10 MG TABS * : Take 10 mg by mouth at bedtime. * : * : . * : metFORMIN (GLUCOPHAGE) 500 MG tablet * : Take 500 mg by mouth. 4 tables once a day * : * : . * : metoprolol tartrate (LOPRESSOR) 25 MG tablet * : Take 1 tablet (25 mg total) by mouth 2 (two) times daily. * : 60 tablet * : 0 . * : Multiple Vitamin (MULTIVITAMIN WITH MINERALS) TABS tablet * : Take 1 tablet by mouth daily. * : * : . * : naproxen sodium (ALEVE) 220 MG tablet * : Take 220 mg by mouth daily as needed. * : * : . * : NITROSTAT 0.4 MG SL tablet * : PLACE 1 TABLET (0.4 MG TOTAL) UNDER THE TONGUE EVERY 5 (FIVE) MINUTES AS NEEDED FOR CHEST PAIN. * : 25 tablet * : 5 . * : PRESCRIPTION MEDICATION * : See admin instructions. CPAP- At bedtime * : * : . * : sertraline (ZOLOFT) 25 MG tablet * : Take 1 tablet (25 mg total) by mouth daily. * : 30 tablet * : 0 Allergies:  No Known Allergies. REVIEW OF SYSTEMS: Negative unless as mentioned above. PHYSICAL EXAMINATION: Patient sitting  comfortably in a wheelchair with prompt responses appropriately. Moderate eye contact. Neurologically speech and comprehension normal without any gross lateralizing abnormalities. Station and gait not tested. Moderate tenderness elicited with palpation in the L2-L3 region. ASSESSMENT AND PLAN: Findings of the MRI scan were reviewed with the patient, and also with the spouse. 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 Pain has been compounded by the onset of new L2 inferior endplate fracture. Patient does report significant debilitation and interference with lifestyle especially when walking and with standing. Option of vertebral body augmentation with inter vertebroplasty or kyphoplasty reviewed with both him and the spouse. The procedure would  allow alleviation of pain emanating from the L2 fracture. However, fracture related to the degenerative changes in the lumbosacral spine described above would probably not be impacted. The procedure, the risks and alternatives were reviewed. Procedure would be an outpatient procedure with the patient having to be monitored in short-stay for 3 hours post procedure. The patient and spouse would like to discuss further prior to finalizing their decision. In the meantime, they were asked to call should they have any concerns or questions. Electronically Signed   By: Luanne Bras M.D.   On: 12/15/2021 08:35    Microbiology: Results for orders placed or performed during the hospital encounter of 12/18/21  Resp Panel by RT-PCR (Flu A&B, Covid) Nasopharyngeal Swab     Status: None   Collection Time: 12/18/21 12:31 PM   Specimen: Nasopharyngeal Swab; Nasopharyngeal(NP) swabs in vial transport medium  Result Value Ref Range Status   SARS Coronavirus 2 by RT PCR NEGATIVE NEGATIVE Final    Comment: (NOTE) SARS-CoV-2 target nucleic acids are NOT DETECTED.  The SARS-CoV-2 RNA is generally  detectable in upper respiratory specimens during the acute phase of infection. The lowest concentration of SARS-CoV-2 viral copies this assay can detect is 138 copies/mL. A negative result does not preclude SARS-Cov-2 infection and should not be used as the sole basis for treatment or other patient management decisions. A negative result may occur with  improper specimen collection/handling, submission of specimen other than nasopharyngeal swab, presence of viral mutation(s) within the areas targeted by this assay, and inadequate number of viral copies(<138 copies/mL). A negative result must be combined with clinical observations, patient history, and epidemiological information. The expected result is Negative.  Fact Sheet for Patients:  EntrepreneurPulse.com.au  Fact Sheet for Healthcare Providers:  IncredibleEmployment.be  This test is no t yet approved or cleared by the [place] FDA and  has been authorized for detection and/or diagnosis of SARS-CoV-2 by FDA under an Emergency Use Authorization (EUA). This EUA will remain  in effect (meaning this test can be used) for the duration of the COVID-19 declaration under Section 564(b)(1) of the Act, 21 U.S.C.section 360bbb-3(b)(1), unless the authorization is terminated  or revoked sooner.       Influenza A by PCR NEGATIVE NEGATIVE Final   Influenza B by PCR NEGATIVE NEGATIVE Final    Comment: (NOTE) The Xpert Xpress SARS-CoV-2/FLU/RSV plus assay is intended as an aid in the diagnosis of influenza from Nasopharyngeal swab specimens and should not be used as a sole basis for treatment. Nasal washings and aspirates are unacceptable for Xpert Xpress SARS-CoV-2/FLU/RSV testing.  Fact Sheet for Patients: EntrepreneurPulse.com.au  Fact Sheet for Healthcare Providers: IncredibleEmployment.be  This test is not yet approved or cleared by the [place] FDA  and has been authorized for detection and/or diagnosis of SARS-CoV-2 by FDA under an Emergency Use Authorization (EUA). This EUA will remain in effect (meaning this test can be used) for the duration of the COVID-19 declaration under Section 564(b)(1) of the Act, 21 U.S.C. section 360bbb-3(b)(1), unless the authorization is terminated or revoked.  Performed at [facility], [place] [address] [place], [place] [address]     Labs: CBC: Recent Labs  Lab 12/18/21 1318 12/19/21 0440  WBC 11.4* 11.0*  NEUTROABS 7.4  --   HGB 14.5 13.0  HCT 43.1 38.9*  MCV 90.7 88.4  PLT 284 676   Basic Metabolic Panel: Recent Labs  Lab 12/18/21 1318 12/19/21 0440 12/20/21 1422 12/21/21 0242 12/22/21 0149  NA  139 140  --  138 139  K 5.1 3.6  --  3.3* 3.6  CL 105 106  --  104 107  CO2 24 23  --  24 24  GLUCOSE 163* 104*  --  136* 150*  BUN 28* 23  --  23 22  CREATININE 1.10 0.89  --  1.00 1.03  CALCIUM 9.6 9.2  --  8.9 9.1  MG  --   --  1.4* 2.3 1.9   Liver Function Tests: Recent Labs  Lab 12/18/21 1318  AST 36  ALT 10  ALKPHOS 54  BILITOT 1.7*  PROT 7.2  ALBUMIN 3.9   CBG: Recent Labs  Lab 12/22/21 1135 12/22/21 1613 12/22/21 2123 12/23/21 0603 12/23/21 1226  GLUCAP 151* 178* 167* 123* 175*    Discharge time spent: greater than 30 minutes.  Signed: Tawni Millers, MD [facility] 12/23/2021

## 2021-12-23 NOTE — Assessment & Plan Note (Addendum)
Patient is awake and alert, he is following commands and answering to questions. Continue to be very weak and deconditioned.   He is not candidate for inpatient rehab. Cervical spine MRI with no acute changes.  Plan to continue with quetiapine daily.

## 2021-12-23 NOTE — Progress Notes (Signed)
Patient discharging home. Vital signs stable at time of discharge as reflected in discharge summary. Discharge instructions given and verbal understanding returned. No questions at this time. 

## 2021-12-23 NOTE — TOC Transition Note (Signed)
Transition of Care [facility]) - CM/SW Discharge Note   Patient Details  Name: Jordan [name] MRN: [id number] Date of Birth: 1940-11-30  [facility] CM/SW Contact:  Jordan Ochs, LCSW Phone Number: 12/23/2021, 2:51 PM   Clinical Narrative:   CSW notified by Rehab Admissions that patient does not qualify for CIR, wife has been updated. CSW spoke with wife who would like to take patient home. Patient has been with [name] in the past, would like to continue with them. CSW contacted [name], but they are unable to accept patient's insurance. [place] has accepted referral, sent orders for PT, OT, and SLP. Pending OT recommendations for any additional DME needed at home.    Final next level of care: Jordan [name] Barriers to Discharge: Barriers Resolved   Patient Goals and CMS Choice Patient states their goals for this hospitalization and ongoing recovery are:: to get stronger CMS Medicare.gov Compare Post Acute Care list provided to:: Patient Choice offered to / list presented to : Patient  Discharge Placement                Patient to be transferred to facility by: Family Name of family member notified: Jordan [name] Patient and family notified of of transfer: 12/23/21  Discharge Plan and Services                          HH Arranged: PT, OT, Speech Therapy [place] Agency: Other - See comment ([place]) Date [place]: 12/23/21   Representative spoke with at [place]: Jordan [name] ([place]) Interventions     Readmission Risk Interventions No flowsheet data found.

## 2021-12-25 ENCOUNTER — Ambulatory Visit: Payer: Medicare HMO | Admitting: Internal Medicine

## 2021-12-29 ENCOUNTER — Encounter (HOSPITAL_COMMUNITY): Payer: Self-pay | Admitting: Interventional Radiology

## 2021-12-29 ENCOUNTER — Inpatient Hospital Stay (HOSPITAL_COMMUNITY): Admission: RE | Admit: 2021-12-29 | Payer: Medicare (Managed Care) | Source: Ambulatory Visit

## 2021-12-29 ENCOUNTER — Ambulatory Visit (HOSPITAL_COMMUNITY)
Admission: RE | Admit: 2021-12-29 | Discharge: 2021-12-29 | Disposition: A | Discharge status: Home / Self Care | Payer: Medicare (Managed Care) | Source: Ambulatory Visit | Attending: Interventional Radiology | Admitting: Interventional Radiology

## 2021-12-29 ENCOUNTER — Other Ambulatory Visit (HOSPITAL_COMMUNITY): Payer: Self-pay | Admitting: Interventional Radiology

## 2021-12-29 ENCOUNTER — Ambulatory Visit (HOSPITAL_COMMUNITY)
Admission: RE | Admit: 2021-12-29 | Discharge: 2021-12-29 | Disposition: A | Discharge status: Home / Self Care | Payer: Medicare (Managed Care) | Source: Ambulatory Visit | Attending: Family Medicine | Admitting: Family Medicine

## 2021-12-29 ENCOUNTER — Encounter (HOSPITAL_COMMUNITY): Payer: Self-pay

## 2021-12-29 ENCOUNTER — Ambulatory Visit: Payer: Medicare HMO | Admitting: Internal Medicine

## 2021-12-29 ENCOUNTER — Other Ambulatory Visit: Payer: Self-pay

## 2021-12-29 DIAGNOSIS — K8500 Idiopathic acute pancreatitis without necrosis or infection: Secondary | ICD-10-CM | POA: Diagnosis not present

## 2021-12-29 DIAGNOSIS — E119 Type 2 diabetes mellitus without complications: Secondary | ICD-10-CM | POA: Diagnosis present

## 2021-12-29 DIAGNOSIS — M19042 Primary osteoarthritis, left hand: Secondary | ICD-10-CM | POA: Diagnosis present

## 2021-12-29 DIAGNOSIS — Z7984 Long term (current) use of oral hypoglycemic drugs: Secondary | ICD-10-CM | POA: Insufficient documentation

## 2021-12-29 DIAGNOSIS — M19041 Primary osteoarthritis, right hand: Secondary | ICD-10-CM | POA: Diagnosis present

## 2021-12-29 DIAGNOSIS — E669 Obesity, unspecified: Secondary | ICD-10-CM | POA: Diagnosis not present

## 2021-12-29 DIAGNOSIS — K5900 Constipation, unspecified: Secondary | ICD-10-CM | POA: Diagnosis not present

## 2021-12-29 DIAGNOSIS — K769 Liver disease, unspecified: Secondary | ICD-10-CM | POA: Diagnosis present

## 2021-12-29 DIAGNOSIS — I110 Hypertensive heart disease with heart failure: Secondary | ICD-10-CM | POA: Diagnosis present

## 2021-12-29 DIAGNOSIS — Z6833 Body mass index (BMI) 33.0-33.9, adult: Secondary | ICD-10-CM | POA: Diagnosis not present

## 2021-12-29 DIAGNOSIS — S32020A Wedge compression fracture of second lumbar vertebra, initial encounter for closed fracture: Secondary | ICD-10-CM

## 2021-12-29 DIAGNOSIS — Z6831 Body mass index (BMI) 31.0-31.9, adult: Secondary | ICD-10-CM | POA: Diagnosis not present

## 2021-12-29 DIAGNOSIS — G4733 Obstructive sleep apnea (adult) (pediatric): Secondary | ICD-10-CM | POA: Diagnosis not present

## 2021-12-29 DIAGNOSIS — I5032 Chronic diastolic (congestive) heart failure: Secondary | ICD-10-CM | POA: Insufficient documentation

## 2021-12-29 DIAGNOSIS — G4700 Insomnia, unspecified: Secondary | ICD-10-CM | POA: Diagnosis not present

## 2021-12-29 DIAGNOSIS — M4856XA Collapsed vertebra, not elsewhere classified, lumbar region, initial encounter for fracture: Secondary | ICD-10-CM | POA: Diagnosis not present

## 2021-12-29 DIAGNOSIS — R001 Bradycardia, unspecified: Secondary | ICD-10-CM | POA: Insufficient documentation

## 2021-12-29 DIAGNOSIS — G47419 Narcolepsy without cataplexy: Secondary | ICD-10-CM | POA: Diagnosis present

## 2021-12-29 DIAGNOSIS — R1310 Dysphagia, unspecified: Secondary | ICD-10-CM | POA: Diagnosis not present

## 2021-12-29 DIAGNOSIS — I951 Orthostatic hypotension: Secondary | ICD-10-CM | POA: Diagnosis not present

## 2021-12-29 DIAGNOSIS — R627 Adult failure to thrive: Secondary | ICD-10-CM | POA: Diagnosis present

## 2021-12-29 DIAGNOSIS — K5791 Diverticulosis of intestine, part unspecified, without perforation or abscess with bleeding: Secondary | ICD-10-CM | POA: Diagnosis not present

## 2021-12-29 DIAGNOSIS — Z8673 Personal history of transient ischemic attack (TIA), and cerebral infarction without residual deficits: Secondary | ICD-10-CM | POA: Diagnosis not present

## 2021-12-29 DIAGNOSIS — D72829 Elevated white blood cell count, unspecified: Secondary | ICD-10-CM | POA: Diagnosis not present

## 2021-12-29 DIAGNOSIS — Z950 Presence of cardiac pacemaker: Secondary | ICD-10-CM | POA: Insufficient documentation

## 2021-12-29 DIAGNOSIS — M545 Low back pain, unspecified: Secondary | ICD-10-CM | POA: Insufficient documentation

## 2021-12-29 DIAGNOSIS — W19XXXA Unspecified fall, initial encounter: Secondary | ICD-10-CM | POA: Diagnosis present

## 2021-12-29 DIAGNOSIS — R079 Chest pain, unspecified: Secondary | ICD-10-CM | POA: Diagnosis not present

## 2021-12-29 DIAGNOSIS — R1110 Vomiting, unspecified: Secondary | ICD-10-CM | POA: Diagnosis not present

## 2021-12-29 DIAGNOSIS — G9341 Metabolic encephalopathy: Secondary | ICD-10-CM | POA: Diagnosis not present

## 2021-12-29 DIAGNOSIS — R110 Nausea: Secondary | ICD-10-CM | POA: Diagnosis not present

## 2021-12-29 DIAGNOSIS — R109 Unspecified abdominal pain: Secondary | ICD-10-CM | POA: Diagnosis not present

## 2021-12-29 DIAGNOSIS — R1111 Vomiting without nausea: Secondary | ICD-10-CM | POA: Diagnosis not present

## 2021-12-29 DIAGNOSIS — E1169 Type 2 diabetes mellitus with other specified complication: Secondary | ICD-10-CM | POA: Diagnosis present

## 2021-12-29 DIAGNOSIS — R5381 Other malaise: Secondary | ICD-10-CM | POA: Diagnosis not present

## 2021-12-29 DIAGNOSIS — E662 Morbid (severe) obesity with alveolar hypoventilation: Secondary | ICD-10-CM | POA: Diagnosis present

## 2021-12-29 DIAGNOSIS — E785 Hyperlipidemia, unspecified: Secondary | ICD-10-CM | POA: Diagnosis present

## 2021-12-29 DIAGNOSIS — Z9989 Dependence on other enabling machines and devices: Secondary | ICD-10-CM | POA: Diagnosis not present

## 2021-12-29 DIAGNOSIS — K5732 Diverticulitis of large intestine without perforation or abscess without bleeding: Secondary | ICD-10-CM | POA: Diagnosis present

## 2021-12-29 DIAGNOSIS — K859 Acute pancreatitis without necrosis or infection, unspecified: Secondary | ICD-10-CM | POA: Diagnosis present

## 2021-12-29 DIAGNOSIS — G3184 Mild cognitive impairment, so stated: Secondary | ICD-10-CM | POA: Diagnosis not present

## 2021-12-29 DIAGNOSIS — E871 Hypo-osmolality and hyponatremia: Secondary | ICD-10-CM | POA: Diagnosis not present

## 2021-12-29 DIAGNOSIS — I10 Essential (primary) hypertension: Secondary | ICD-10-CM | POA: Diagnosis not present

## 2021-12-29 DIAGNOSIS — S32020D Wedge compression fracture of second lumbar vertebra, subsequent encounter for fracture with routine healing: Secondary | ICD-10-CM | POA: Diagnosis not present

## 2021-12-29 DIAGNOSIS — J9811 Atelectasis: Secondary | ICD-10-CM | POA: Diagnosis not present

## 2021-12-29 DIAGNOSIS — M6281 Muscle weakness (generalized): Secondary | ICD-10-CM | POA: Diagnosis not present

## 2021-12-29 DIAGNOSIS — T8144XA Sepsis following a procedure, initial encounter: Secondary | ICD-10-CM | POA: Diagnosis present

## 2021-12-29 DIAGNOSIS — Z87891 Personal history of nicotine dependence: Secondary | ICD-10-CM | POA: Diagnosis not present

## 2021-12-29 DIAGNOSIS — G20 Parkinson's disease: Secondary | ICD-10-CM | POA: Insufficient documentation

## 2021-12-29 DIAGNOSIS — Z800 Family history of malignant neoplasm of digestive organs: Secondary | ICD-10-CM | POA: Diagnosis not present

## 2021-12-29 DIAGNOSIS — R0902 Hypoxemia: Secondary | ICD-10-CM | POA: Diagnosis not present

## 2021-12-29 DIAGNOSIS — E872 Acidosis, unspecified: Secondary | ICD-10-CM | POA: Diagnosis not present

## 2021-12-29 DIAGNOSIS — S32029A Unspecified fracture of second lumbar vertebra, initial encounter for closed fracture: Secondary | ICD-10-CM | POA: Diagnosis present

## 2021-12-29 DIAGNOSIS — Z79899 Other long term (current) drug therapy: Secondary | ICD-10-CM | POA: Diagnosis not present

## 2021-12-29 DIAGNOSIS — R1084 Generalized abdominal pain: Secondary | ICD-10-CM | POA: Diagnosis present

## 2021-12-29 DIAGNOSIS — R0609 Other forms of dyspnea: Secondary | ICD-10-CM | POA: Diagnosis not present

## 2021-12-29 DIAGNOSIS — Z20822 Contact with and (suspected) exposure to covid-19: Secondary | ICD-10-CM | POA: Diagnosis present

## 2021-12-29 DIAGNOSIS — R0602 Shortness of breath: Secondary | ICD-10-CM | POA: Diagnosis not present

## 2021-12-29 DIAGNOSIS — R059 Cough, unspecified: Secondary | ICD-10-CM | POA: Diagnosis not present

## 2021-12-29 DIAGNOSIS — I2510 Atherosclerotic heart disease of native coronary artery without angina pectoris: Secondary | ICD-10-CM | POA: Diagnosis not present

## 2021-12-29 HISTORY — PX: IR KYPHO LUMBAR INC FX REDUCE BONE BX UNI/BIL CANNULATION INC/IMAGING: IMG5519

## 2021-12-29 MED ORDER — CEFAZOLIN SODIUM-DEXTROSE 2-4 GM/100ML-% IV SOLN: 2.0000 g | INTRAVENOUS | Status: AC

## 2021-12-29 MED ORDER — MIDAZOLAM HCL 2 MG/2ML IJ SOLN
INTRAMUSCULAR | Status: AC | PRN
  Administered 2021-12-29 (×2): 1 mg via INTRAVENOUS

## 2021-12-29 MED ORDER — TOBRAMYCIN SULFATE 1.2 G IJ SOLR
INTRAMUSCULAR | Status: AC
  Filled 2021-12-29: qty 1.2

## 2021-12-29 MED ORDER — MIDAZOLAM HCL 2 MG/2ML IJ SOLN
INTRAMUSCULAR | Status: AC
  Filled 2021-12-29: qty 4

## 2021-12-29 MED ORDER — FENTANYL CITRATE (PF) 100 MCG/2ML IJ SOLN
INTRAMUSCULAR | Status: AC | PRN
  Administered 2021-12-29 (×2): 25 ug via INTRAVENOUS

## 2021-12-29 MED ORDER — BUPIVACAINE HCL (PF) 0.5 % IJ SOLN
INTRAMUSCULAR | Status: AC
  Administered 2021-12-29: 10 mL
  Filled 2021-12-29: qty 30

## 2021-12-29 MED ORDER — FENTANYL CITRATE (PF) 100 MCG/2ML IJ SOLN
INTRAMUSCULAR | Status: AC
  Filled 2021-12-29: qty 4

## 2021-12-29 MED ORDER — CEFAZOLIN SODIUM-DEXTROSE 2-4 GM/100ML-% IV SOLN
INTRAVENOUS | Status: AC
  Administered 2021-12-29: 2 g via INTRAVENOUS
  Filled 2021-12-29: qty 100

## 2021-12-29 MED ORDER — SODIUM CHLORIDE 0.9 % IV SOLN: INTRAVENOUS | Status: AC

## 2021-12-29 MED ORDER — HYDRALAZINE HCL 20 MG/ML IJ SOLN
INTRAMUSCULAR | Status: AC
  Filled 2021-12-29: qty 1

## 2021-12-29 MED ORDER — IOHEXOL 300 MG/ML  SOLN
100.0000 mL | Freq: Once | INTRAMUSCULAR | Status: AC | PRN
  Administered 2021-12-29: 10 mL

## 2021-12-29 NOTE — H&P (Signed)
Chief Complaint: Patient was seen in consultation today for lumbar 2 compression fracture   Referring Physician(s): Deveshwar,Sanjeev  Supervising Physician: Luanne Bras  Patient Status: [facility] - Out-pt  History of Present Illness: Jordan [name] is an 81 y.o. male with a medical history significant for parkinson's, bradycardia (pacemaker), DM2, OSA on CPAP, HTN, CHF and CVA (2021). He has a history of falls and had a fall September 2022 that left him with severe back pain. An MRI obtained 11/19/21 showed an L2 compression fracture and the patient met with Dr. Estanislado Pandy 12/14/21 for further evaluation of debilitating low back pain.  Dr. Estanislado Pandy discussed that an L2 kyphoplasty/vertebroplasty may alleviate the pain emanating from this area but pain related to degenerative changes in the lumbosacral spine will likely not be impacted. After further discussions about the risks and benefits of the procedure the patient and his wife requested time to discuss before finalizing their decision.   The patient presented to the ED 12/18/21 with worsening hallucinations/delusions and increased falls. This was attributed due to his Sinemet medication. While he was inpatient IR was asked to evaluate him for possible kyphoplasty/vertebroplasty. Due to the patient being on Plavix, IR tentatively planned for the patient to have the procedure 12/29/21 after a 5 day Plavix hold; pending discharge details were unknown at the time of IR consultation 12/22/21 and the procedure was not scheduled due to this.    The patient was discharged home with home health 12/23/21. The patient and his wife arrived to the [facility] Radiology department today, 12/29/21 for the kyphoplasty/vertebroplasty. This was never scheduled/finalized but IR was able to accommodate.   The patient continues to have debilitating low back pain during ambulation. The patient is in agreement to have this procedure done today.   Past Medical  History:  Diagnosis Date   Arthritis    R shoulder, bone spur   Arthritis    "hands" (03/25/2016)   Chronic diastolic heart failure (HCC)    Chronic lower back pain    Coronary heart disease    Dr Pernell Dupre   H/O cardiovascular stress test    perhaps last one was 2009   Heart murmur    12/29/11 echo: mild MR, no AS, trivial TR   History of gout    Hyperlipidemia    Hypertension    saw Dr. Linard Millers last earl;y- 2014, cardiac cath. last done ?2009, blocks seen didn't require any intervention at that point.   Narcolepsy    [name] 04-11-97; Mean Latency 1.17mn, SOREM 2   OSA on CPAP    NPSG 12-13-98 AHI 22.7   PONV (postoperative nausea and vomiting)    Presence of permanent cardiac pacemaker    Shortness of breath    with exertion    [place]roke [place]    [place]     Past Surgical History:  Procedure Laterality Date   CARDIAC CATHETERIZATION  2009?   EP IMPLANTABLE DEVICE N/A 03/25/2016   Procedure: Pacemaker Implant - Dual Chamber;  Surgeon: GEvans Lance MD;  [facility];  Service: Cardiovascular;  Laterality: N/A;   FRACTURE SURGERY     INSERT / REPLACE / REMOVE PACEMAKER  03/24/2016   IR RADIOLOGIST EVAL & MGMT  12/15/2021   LEFT AND RIGHT HEART CATHETERIZATION WITH CORONARY ANGIOGRAM N/A 06/05/2014   Procedure: LEFT AND RIGHT HEART CATHETERIZATION WITH CORONARY ANGIOGRAM;  Surgeon: HSinclair Grooms MD;  [facility];  Service: Cardiovascular;  Laterality: N/A;   NASAL FRACTURE SURGERY     SHOULDER ARTHROSCOPY WITH ROTATOR CUFF REPAIR AND SUBACROMIAL DECOMPRESSION Right 08/16/2013   Procedure: SHOULDER ARTHROSCOPY WITH ROTATOR CUFF REPAIR AND SUBACROMIAL DECOMPRESSION;  Surgeon: [name], MD;  Location: [place];  Service: Orthopedics;  Laterality: Right;  Right shoulder arthroscopy rotator cuff repair, subacromial decompression.   TONSILLECTOMY      Allergies: No known allergies   Medications: Prior to Admission  medications   Medication Sig Start Date End Date Taking? Authorizing Provider  acetaminophen (TYLENOL) 500 MG tablet Take 1,000 mg by mouth 2 (two) times daily.    [provider]  amphetamine-dextroamphetamine (ADDERALL) 10 MG tablet Take 10 mg by mouth daily as needed (help control symptoms of parkinsons).    [provider]  atorvastatin (LIPITOR) 40 MG tablet Take 0.5 tablets (20 mg total) by mouth daily. 07/30/21   Medina-Vargas, Monina C, NP  bifidobacterium infantis (ALIGN) capsule Take 1 capsule by mouth daily.    [provider]  carbidopa-levodopa (SINEMET) 25-100 MG tablet Take 1 tablet by mouth 3 (three) times daily. 09/14/21   Frann Rider, NP  cholecalciferol (VITAMIN D3) 25 MCG (1000 UNIT) tablet Take 1,000 Units by mouth daily.    [provider]  Coenzyme Q10 (CO Q-10 PO) Take 1 capsule by mouth daily.    [provider]  lidocaine (LIDODERM) 5 % Place 1 patch onto the skin in the morning and at bedtime. 11/26/21   [provider]  lisinopril (ZESTRIL) 10 MG tablet Take 1 tablet (10 mg total) by mouth daily. Patient taking differently: Take 20 mg by mouth daily. 07/30/21   Medina-Vargas, Monina C, NP  Melatonin 10 MG TABS Take 10 mg by mouth at bedtime.    [provider]  metFORMIN (GLUCOPHAGE) 500 MG tablet Take 500 mg by mouth. 4 tables once a day    [provider]  metoprolol tartrate (LOPRESSOR) 25 MG tablet Take 1 tablet (25 mg total) by mouth 2 (two) times daily. 07/30/21   Medina-Vargas, Monina C, NP  Multiple Vitamin (MULTIVITAMIN WITH MINERALS) TABS tablet Take 1 tablet by mouth daily. 02/19/18   Georgette Shell, MD  NITROSTAT 0.4 MG SL tablet PLACE 1 TABLET (0.4 MG TOTAL) UNDER THE TONGUE EVERY 5 (FIVE) MINUTES AS NEEDED FOR CHEST PAIN. Patient taking differently: Place 0.4 mg under the tongue every 5 (five) minutes as needed for chest pain. 03/31/16   Belva Crome, MD  Omega 3 1200 MG CAPS Take  1,200 mg by mouth daily.    [provider]  PRESCRIPTION MEDICATION See admin instructions. CPAP- At bedtime    [provider]  QUEtiapine (SEROQUEL) 25 MG tablet Take 1/2 tablets (12.5 mg total) by mouth at bedtime. 12/23/21 01/22/22  Arrien, Jimmy Picket, MD  traMADol (ULTRAM) 50 MG tablet Take 50 mg by mouth every 6 (six) hours as needed for moderate pain (lower back pain).    [provider]     Family History  Problem Relation Age of Onset   Sleep apnea Sister    Colon cancer Sister     Social History   Socioeconomic History   Marital status: Married    Spouse name: [name]   Number of children: Not on file   Years of education: Not on file   Highest education level: Not on file  Occupational History   Occupation: Insurance [employer]   Occupation: [place] [employer]  Tobacco Use   Smoking status: Former  Packs/day: 2.50    Years: 6.00    Pack years: 15.00    Types: Cigarettes    Quit date: 06/05/1967    Years since quitting: 54.6   Smokeless tobacco: Never  Vaping Use   Vaping Use: Never used  Substance and Sexual Activity   Alcohol use: Yes    Comment: rare   Drug use: No   Sexual activity: Not Currently  Other Topics Concern   Not on file  Social History Narrative   Lives with wife and son   Right handed   Drinks 1 cups caffeine daily   Social Determinants of Health   Financial Resource Strain: Not on file  Food Insecurity: Not on file  Transportation Needs: Not on file  Physical Activity: Not on file  Stress: Not on file  Social Connections: Not on file    Review of Systems: A 12 point ROS discussed and pertinent positives are indicated in the HPI above.  All other systems are negative.  Review of Systems  Constitutional:  Negative for appetite change and fatigue.  Respiratory:  Negative for cough and shortness of breath.   Cardiovascular:  Negative for chest pain and leg swelling.  Gastrointestinal:  Negative for  abdominal pain, diarrhea, nausea and vomiting.  Musculoskeletal:  Positive for back pain.  Neurological:  Negative for dizziness and headaches.   Vital Signs: Temp: 98, BP 181/98, HR 66, RR 23   Physical Exam Constitutional:      General: He is not in acute distress. HENT:     Mouth/Throat:     Mouth: Mucous membranes are moist.     Pharynx: Oropharynx is clear.  Cardiovascular:     Rate and Rhythm: Normal rate and regular rhythm.     Pulses: Normal pulses.     Heart sounds: Normal heart sounds.  Pulmonary:     Effort: Pulmonary effort is normal.     Breath sounds: Normal breath sounds.  Abdominal:     General: Bowel sounds are normal.     Palpations: Abdomen is soft.  Musculoskeletal:     Right lower leg: No edema.     Left lower leg: No edema.     Comments: Low back pain  Skin:    General: Skin is warm and dry.  Neurological:     Mental Status: He is alert and oriented to person, place, and time.    Imaging: CT Head Wo Contrast  Result Date: 12/18/2021 CLINICAL DATA:  Neuro deficit, acute stroke suspected. EXAM: CT HEAD WITHOUT CONTRAST TECHNIQUE: Contiguous axial images were obtained from the base of the skull through the vertex without intravenous contrast. RADIATION DOSE REDUCTION: This exam was performed according to the departmental dose-optimization program which includes automated exposure control, adjustment of the mA and/or kV according to patient size and/or use of iterative reconstruction technique. COMPARISON:  CT head dated June 30, 2021 FINDINGS: Brain: No evidence of acute infarction, hemorrhage, hydrocephalus, extra-axial collection or mass lesion/mass effect. Moderate cerebral atrophy and chronic microvascular ischemic changes of the white matter, unchanged. Bilateral chronic thalamic lacunar infarcts. Vascular: No hyperdense vessel or unexpected calcification. Skull: Normal. Negative for fracture or focal lesion. Sinuses/Orbits: Partial opacification of the  left maxillary sinus, complete opacification of the left frontal sinus and patchy opacification of ethmoid air cells. Other: None. IMPRESSION: 1.  No acute intracranial abnormality. 2. Cerebral atrophy, white matter ischemic changes and chronic lacunar infarcts of bilateral thalami. 3.  Chronic paranasal sinus disease. Electronically Signed   By:  Imran  Ahmed D.O.   On: 12/18/2021 14:19   MR CERVICAL SPINE WO CONTRAST  Result Date: 12/22/2021 CLINICAL DATA:  Myelopathy, chronic, cervical spine EXAM: MRI CERVICAL SPINE WITHOUT CONTRAST TECHNIQUE: Multiplanar, multisequence MR imaging of the cervical spine was performed. No intravenous contrast was administered. COMPARISON:  None. FINDINGS: Alignment: Reversal of the normal cervical lordosis. Mild retrolisthesis of C5 on C6. Vertebrae: Vertebral body heights are maintained. No focal marrow edema to suggest acute fracture discitis/osteomyelitis. Cord: Normal cord signal. Posterior Fossa, vertebral arteries, paraspinal tissues: Visualized vertebral artery flow voids are maintained. No evidence of acute abnormality in the visualized posterior fossa on limited assessment. Disc levels: C2-C3: Suspected partial osseous fusion across the disc space. No significant disc protrusion or canal stenosis. Left greater than right facet hypertrophy with mild left foraminal stenosis. Patent right foramen. C3-C4: Suspected partial osseous fusion across the disc space. Bilateral facet and uncovertebral hypertrophy. Mild bilateral foraminal stenosis. No significant canal stenosis. C4-C5: Posterior disc osteophyte complex with bilateral facet and uncovertebral hypertrophy. Resulting mild canal and bilateral foraminal stenosis. C5-C6: Posterior disc osteophyte complex with ligamentum flavum thickening. Resulting mild to moderate canal stenosis and moderate bilateral foraminal stenosis. C6-C7: Small posterior disc osteophyte complex with bilateral facet hypertrophy. Mild left foraminal  stenosis without significant canal or right foraminal stenosis. C7-T1: No significant disc protrusion, foraminal stenosis, or canal stenosis. IMPRESSION: 1. 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 2. At C4-C5, mild canal and bilateral foraminal stenosis. 3. Additional mild foraminal stenosis on the left at C2-C3 and C6-C7 and bilaterally at C3-C4. Electronically Signed   By: Margaretha Sheffield M.D.   On: 12/22/2021 14:08   DG Chest Port 1 View  Result Date: 12/18/2021 CLINICAL DATA:  Shortness of breath. EXAM: PORTABLE CHEST 1 VIEW COMPARISON:  06/21/2020. FINDINGS: Trachea is midline. Heart is enlarged. Thoracic aorta is calcified. Pacemaker lead tips are in the right atrium and right ventricle. Lungs are somewhat low in volume with minimal left basilar subsegmental volume loss. No pleural fluid. IMPRESSION: No acute findings. Electronically Signed   By: Lorin Picket M.D.   On: 12/18/2021 14:34   IR Radiologist Eval & Mgmt  Result Date: 12/15/2021 EXAM: NEW PATIENT OFFICE VISIT CHIEF COMPLAINT: Low back pain. Current Pain Level: 1-10 HISTORY OF PRESENT ILLNESS: History obtained from the patient, his spouse, and also the electronic medical records. 81 year old right handed gentleman referred for evaluation and management of worsening low back pain, seen on the MRI of the lumbosacral spine of November 19, 2021. According to the patient and the spouse the patient's low intensive intermittent chronic low back pain worsened some time and subsequent to ischemic stroke which left him weak in the right arm and leg and also associated with difficulty with balance. Since that time the patient has been having multiple of falls, soft and hard. He reports around July 31, 2021, the patient apparently fell on his buttocks following which he started experiencing severe low back pain. An MRI done on January 19th did show an inferior endplate compression fracture at L2. 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 Patient has been undergoing intensive rehab, but continues to have significant debilitating pain in the low back region especially with prolonged standing and walking. The patient reports this as an 8/10 when severe. He has to sit down for pain relief. Pain relieved some with over the counter Tylenol. He has also  been prescribed a lidocaine patch in the lumbar region with modest relief. At the present, the patient reports no acute radiculopathy associated with the pain. Patient ambulates with a walker due to pain and also due to sequela of his stroke. Pain does not interfere with the patient's sleeping pattern. His appetite is normal. He eats regular meals. He denies any symptoms of dysuria, hematuria, or of polyuria. Denies any autonomic dysfunction of bowel or bladder. Denies recent chest pain, shortness of breath, palpitation, coughing, wheezing or hemoptysis. He denies any abdominal pain, difficulty swallowing, constipation, diarrhea or melena. Denies recent chills, fever or rigors. Patient lives with his spouse. He and she feel significant debilitation related to the pain especially on standing and walking. Diagnosis * : Date . * : Arthritis * : * : R shoulder, bone spur . * : Arthritis * : * : "hands" (03/25/2016) . * : Chronic diastolic heart failure (HCC) * : . * : Chronic lower back pain * : . * : Coronary heart disease * : * : Dr Pernell Dupre . * : H/O cardiovascular stress test * : * : perhaps last one was 2009 . * : Heart murmur * : * : 12/29/11 echo: mild MR, no AS, trivial TR . * : History of gout * : . * : Hyperlipidemia * : . * : Hypertension * : * : saw Dr. Linard Millers last earl;y- 2014, cardiac cath. last done ?2009, blocks seen didn't require any intervention at that point. . * : Narcolepsy * : * : [name] 04-11-97; Mean Latency 1.54mn, SOREM 2 . * : OSA on CPAP * : * : NPSG  12-13-98 AHI 22.7 . * : PONV (postoperative nausea and vomiting) * : . * : Presence of permanent cardiac pacemaker * : . * : Shortness of breath * : * : with exertion . * : St[place] ([place] * : . * : Type II diabetes mellitus ([place] * : PSH: Past Surgical History: Procedure * : Laterality * : Date . * : CARDIAC CATHETERIZATION * : * : 2009? .[name]* : EP IMPLANTABLE DEVICE * : N/A * : 03/25/2016 * : Procedure: Pacemaker Implant - Dual Chamber; Surgeon: GEvans Lance MD; [facility]; Service: Cardiovascular; Laterality: N/A; . * : FRACTURE SURGERY * : * : . * : INSERT / REPLACE / REMOVE PACEMAKER * : * : 03/24/2016 . * : LEFT AND RIGHT HEART CATHETERIZATION WITH CORONARY ANGIOGRAM * : N/A * : 06/05/2014 * : Procedure: LEFT AND RIGHT HEART CATHETERIZATION WITH CORONARY ANGIOGRAM; Surgeon: HSinclair Grooms MD; [facility]; Service: Cardiovascular; Laterality: N/A; . * : NASAL FRACTURE SURGERY * : * : . * : SHOULDER ARTHROSCOPY WITH ROTATOR CUFF REPAIR AND SUBACROMIAL DECOMPRESSION * : Right * : 08/16/2013 * : Procedure: SHOULDER ARTHROSCOPY WITH ROTATOR CUFF REPAIR AND SUBACROMIAL DECOMPRESSION; Surgeon: JNita Sells MD; [place]: [place]opedics; Laterality: Right; Right shoulder arthroscopy rotator cuff repair, subacromial decompression. . * : TONSILLECTOMY * : * : Socioeconomic History . * : Marital status: * : Married * : * : Spouse name: * : Kelly . * : Number of children: * : Not on file . * : Years of education: * : Not on file . * : Highest education level: * : Not on file Occupational History . 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 * : Occupation: * : [employer] Tobacco Use . * :  Smoking status: * : Former * : * : Packs/day: * : 2.50 * : * : Years: * : 6.00 * : * : Pack years: * : 15.00 * : * : Types: * : Cigarettes * : * : Quit date: * : 06/05/1967 * : * : Years since quitting: * : 54.5 . * : Smokeless tobacco: * : Never Vaping Use . * : Vaping Use: * : Never  used Substance and Sexual Activity . * : Alcohol use: * : Yes * : * : Comment: rare . * : Drug use: * : No . * : Sexual activity: * : Not Currently Other Topics * : Concern . * : Not on file Social History Narrative * : Lives with wife and son * : Right handed * : Drinks 1 cups caffeine daily Food Insecurity: Not on file Transportation Needs: Not on file Physical Activity: Not on file Stress: Not on file Social Connections: Not on file Intimate Partner Violence: Not on file Family History: Family History Problem * : Relation * : Age of Onset . * : Sleep apnea * : Sister * : . * : Colon cancer * : Sister * : Medications: Current Outpatient Medications on File Prior to Visit Medication * : Sig * : Dispense * : Refill . * : acetaminophen (TYLENOL) 500 MG tablet * : Take 1,000 mg by mouth 2 (two) times daily. * : * : . * : atorvastatin (LIPITOR) 40 MG tablet * : Take 0.5 tablets (20 mg total) by mouth daily. * : 30 tablet * : 0 . * : carbidopa-levodopa (SINEMET) 25-100 MG tablet * : Take 1 tablet by mouth 3 (three) times daily. * : 90 tablet * : 5 . * : Carbidopa-Levodopa ER (SINEMET CR) 25-100 MG tablet controlled release * : Take 1 tablet by mouth at bedtime. * : 30 tablet * : 5 . * : cholecalciferol (VITAMIN D3) 25 MCG (1000 UNIT) tablet * : Take 1,000 Units by mouth daily. * : * : . * : clonazePAM (KLONOPIN) 0.5 MG tablet * : TAKE 1 TABLET BY MOUTH EVERY DAY AT BEDTIME AS NEEDED FOR SLEEP * : 30 tablet * : 5 . * : clopidogrel (PLAVIX) 75 MG tablet * : Take 1 tablet (75 mg total) by mouth daily. * : 30 tablet * : 0 . * : Coenzyme Q10 (CO Q-10 PO) * : Take 1 capsule by mouth daily. * : * : . * : lidocaine (LIDODERM) 5 % * : Lidoderm 5 % topical patch APPLY 1 PATCH BY TOPICAL ROUTE ONCE DAILY (MAY WEAR UP TO 12 HOURS.) * : * : . * : lisinopril (ZESTRIL) 10 MG tablet * : Take 1 tablet (10 mg total) by mouth daily. (Patient taking differently: Take 20 mg by mouth daily.) * : 30 tablet * : 0 . * : Melatonin 10 MG TABS * :  Take 10 mg by mouth at bedtime. * : * : . * : metFORMIN (GLUCOPHAGE) 500 MG tablet * : Take 500 mg by mouth. 4 tables once a day * : * : . * : metoprolol tartrate (LOPRESSOR) 25 MG tablet * : Take 1 tablet (25 mg total) by mouth 2 (two) times daily. * : 60 tablet * : 0 . * : Multiple Vitamin (MULTIVITAMIN WITH MINERALS) TABS tablet * : Take 1 tablet by mouth daily. * : * : . * : naproxen sodium (ALEVE) 220 MG  tablet * : Take 220 mg by mouth daily as needed. * : * : . * : NITROSTAT 0.4 MG SL tablet * : PLACE 1 TABLET (0.4 MG TOTAL) UNDER THE TONGUE EVERY 5 (FIVE) MINUTES AS NEEDED FOR CHEST PAIN. * : 25 tablet * : 5 . * : PRESCRIPTION MEDICATION * : See admin instructions. CPAP- At bedtime * : * : . * : sertraline (ZOLOFT) 25 MG tablet * : Take 1 tablet (25 mg total) by mouth daily. * : 30 tablet * : 0 Allergies:  No Known Allergies. REVIEW OF SYSTEMS: Negative unless as mentioned above. PHYSICAL EXAMINATION: Patient sitting comfortably in a wheelchair with prompt responses appropriately. Moderate eye contact. Neurologically speech and comprehension normal without any gross lateralizing abnormalities. Station and gait not tested. Moderate tenderness elicited with palpation in the L2-L3 region. ASSESSMENT AND PLAN: Findings of the MRI scan were reviewed with the patient, and also with the spouse. 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 Pain has been compounded by the onset of new L2 inferior endplate fracture. Patient does report significant debilitation and interference with lifestyle especially when walking and with standing. Option of vertebral body augmentation with inter vertebroplasty or kyphoplasty reviewed with both him and the spouse. The procedure would allow alleviation of pain emanating from the L2 fracture. However, fracture related to the degenerative changes in the lumbosacral spine described above would probably not  be impacted. The procedure, the risks and alternatives were reviewed. Procedure would be an outpatient procedure with the patient having to be monitored in short-stay for 3 hours post procedure. The patient and spouse would like to discuss further prior to finalizing their decision. In the meantime, they were asked to call should they have any concerns or questions. Electronically Signed   By: Luanne Bras M.D.   On: 12/15/2021 08:35    Labs:  CBC: Recent Labs    07/24/21 0000 10/28/21 2023 12/18/21 1318 12/19/21 0440  WBC 8.4 11.0* 11.4* 11.0*  HGB 13.1* 14.4 14.5 13.0  HCT 39* 43.9 43.1 38.9*  PLT 219 278 284 253    COAGS: Recent Labs    06/24/21 0229  INR 1.1    BMP: Recent Labs    07/08/21 0000 07/23/21 0000 07/24/21 0000 12/18/21 1318 12/19/21 0440 12/21/21 0242 12/22/21 0149  NA 141 145   < > 139 140 138 139  K 4.2 3.6   < > 5.1 3.6 3.3* 3.6  CL 106 109*   < > 105 106 104 107  CO2 22 22   < > _0 GLUCOSE  --   --    < > 163* 104* 136* 150*  BUN 23* 14   < > 28* _1 CALCIUM 9.5 9.0   < > 9.6 9.2 8.9 9.1  CREATININE 0.8 0.8   < > 1.10 0.89 1.00 1.03  GFRNONAA 81.89 83.53   < > >60 >60 >60 >60  GFRAA >90 >90  --   --   --   --   --    < > = values in this interval not displayed.    LIVER FUNCTION TESTS: Recent Labs    06/24/21 0229 06/30/21 0241 10/28/21 2023 12/18/21 1318  BILITOT 1.1 0.9 0.7 1.7*  AST _2 36  ALT 19 <_3 ALKPHOS 50 58 57 54  PROT 6.3* 6.6  7.1 7.2  ALBUMIN 3.5 3.3* 4.0 3.9    TUMOR MARKERS: No results for input(s): AFPTM, CEA, CA199, CHROMGRNA in the last 8760 hours.  Assessment and Plan:  Lumbar two compression fracture: Pilar Plate A. Mcclure, 81 year old male, presents today to the [facility] department for an image-guided lumbar two kyphoplasty/vertebroplasty.  Risks and benefits of Lumbar two kyphoplasty/vertebroplasty were discussed with the patient including, but not  limited to education regarding the natural healing process of compression fractures without intervention, bleeding, infection, cement migration which may cause spinal cord damage, paralysis, pulmonary embolism or even death. This interventional procedure involves the use of X-rays and because of the nature of the planned procedure, it is possible that we will have prolonged use of X-ray fluoroscopy. Potential radiation risks to you include (but are not limited to) the following: - A slightly elevated risk for cancer  several years later in life. This risk is typically less than 0.5% percent. This risk is low in comparison to the normal incidence of human cancer, which is 33% for women and 50% for men according to the [place]. - Radiation induced injury can include skin redness, resembling a rash, tissue breakdown / ulcers and hair loss (which can be temporary or permanent).  The likelihood of either of these occurring depends on the difficulty of the procedure and whether you are sensitive to radiation due to previous procedures, disease, or genetic conditions.  IF your procedure requires a prolonged use of radiation, you will be notified and given written instructions for further action.  It is your responsibility to monitor the irradiated area for the 2 weeks following the procedure and to notify your physician if you are concerned that you have suffered a radiation induced injury.    All of the patient's questions were answered, patient is agreeable to proceed. The patient has been NPO. His plavix has been held for five days.   Consent signed and in chart.  Thank you for this interesting consult.  I greatly enjoyed meeting TUDOR CHANDLEY and look forward to participating in their care.  A copy of this report was sent to the requesting provider on this date.  Electronically Signed: Soyla Dryer, AGACNP-BC [phone] 12/29/2021, 11:52 AM   I spent a total of  30 Minutes   in  face to face in clinical consultation, greater than 50% of which was counseling/coordinating care for lumbar two kyphoplasty/vertebroplasty

## 2021-12-29 NOTE — Progress Notes (Signed)
Patient and wife was given discharge instructions. Both verbalized understanding. 

## 2021-12-29 NOTE — Discharge Instructions (Signed)
1.No stooping,or bending or lifting more than 10 lbs for 2 weeks. 2.Use walker to ambulate for 2 weeks. 3.No driving for 2 weeks. 4.RTC PRN 2 weeks

## 2021-12-29 NOTE — Procedures (Signed)
S/P L2 balloon KP . S.Cobe Viney MD

## 2021-12-30 ENCOUNTER — Emergency Department (HOSPITAL_COMMUNITY): Payer: Medicare (Managed Care)

## 2021-12-30 ENCOUNTER — Inpatient Hospital Stay (HOSPITAL_COMMUNITY)
Admission: EM | Admit: 2021-12-30 | Discharge: 2022-01-08 | DRG: 856 | Disposition: A | Discharge status: Rehabilitation Facility | Payer: Medicare (Managed Care) | Attending: Internal Medicine | Admitting: Internal Medicine

## 2021-12-30 ENCOUNTER — Encounter (HOSPITAL_COMMUNITY): Payer: Self-pay

## 2021-12-30 DIAGNOSIS — S32029A Unspecified fracture of second lumbar vertebra, initial encounter for closed fracture: Secondary | ICD-10-CM | POA: Diagnosis present

## 2021-12-30 DIAGNOSIS — K5792 Diverticulitis of intestine, part unspecified, without perforation or abscess without bleeding: Secondary | ICD-10-CM

## 2021-12-30 DIAGNOSIS — Z87891 Personal history of nicotine dependence: Secondary | ICD-10-CM

## 2021-12-30 DIAGNOSIS — G4733 Obstructive sleep apnea (adult) (pediatric): Secondary | ICD-10-CM | POA: Diagnosis not present

## 2021-12-30 DIAGNOSIS — D72829 Elevated white blood cell count, unspecified: Secondary | ICD-10-CM

## 2021-12-30 DIAGNOSIS — W19XXXA Unspecified fall, initial encounter: Secondary | ICD-10-CM | POA: Diagnosis present

## 2021-12-30 DIAGNOSIS — Z800 Family history of malignant neoplasm of digestive organs: Secondary | ICD-10-CM

## 2021-12-30 DIAGNOSIS — K859 Acute pancreatitis without necrosis or infection, unspecified: Secondary | ICD-10-CM | POA: Diagnosis present

## 2021-12-30 DIAGNOSIS — T8144XA Sepsis following a procedure, initial encounter: Principal | ICD-10-CM | POA: Diagnosis present

## 2021-12-30 DIAGNOSIS — N200 Calculus of kidney: Secondary | ICD-10-CM | POA: Diagnosis present

## 2021-12-30 DIAGNOSIS — R5381 Other malaise: Secondary | ICD-10-CM

## 2021-12-30 DIAGNOSIS — Z9989 Dependence on other enabling machines and devices: Secondary | ICD-10-CM | POA: Diagnosis not present

## 2021-12-30 DIAGNOSIS — G9341 Metabolic encephalopathy: Secondary | ICD-10-CM | POA: Diagnosis not present

## 2021-12-30 DIAGNOSIS — Z6831 Body mass index (BMI) 31.0-31.9, adult: Secondary | ICD-10-CM | POA: Diagnosis not present

## 2021-12-30 DIAGNOSIS — E119 Type 2 diabetes mellitus without complications: Secondary | ICD-10-CM | POA: Diagnosis present

## 2021-12-30 DIAGNOSIS — G20 Parkinson's disease: Secondary | ICD-10-CM | POA: Diagnosis present

## 2021-12-30 DIAGNOSIS — K5900 Constipation, unspecified: Secondary | ICD-10-CM

## 2021-12-30 DIAGNOSIS — E871 Hypo-osmolality and hyponatremia: Secondary | ICD-10-CM | POA: Diagnosis not present

## 2021-12-30 DIAGNOSIS — I5032 Chronic diastolic (congestive) heart failure: Secondary | ICD-10-CM | POA: Diagnosis present

## 2021-12-30 DIAGNOSIS — E662 Morbid (severe) obesity with alveolar hypoventilation: Secondary | ICD-10-CM | POA: Diagnosis present

## 2021-12-30 DIAGNOSIS — R1084 Generalized abdominal pain: Secondary | ICD-10-CM

## 2021-12-30 DIAGNOSIS — I110 Hypertensive heart disease with heart failure: Secondary | ICD-10-CM | POA: Diagnosis present

## 2021-12-30 DIAGNOSIS — Z20822 Contact with and (suspected) exposure to covid-19: Secondary | ICD-10-CM | POA: Diagnosis present

## 2021-12-30 DIAGNOSIS — A419 Sepsis, unspecified organism: Secondary | ICD-10-CM | POA: Diagnosis present

## 2021-12-30 DIAGNOSIS — R0602 Shortness of breath: Secondary | ICD-10-CM

## 2021-12-30 DIAGNOSIS — K769 Liver disease, unspecified: Secondary | ICD-10-CM | POA: Diagnosis present

## 2021-12-30 DIAGNOSIS — Z8673 Personal history of transient ischemic attack (TIA), and cerebral infarction without residual deficits: Secondary | ICD-10-CM

## 2021-12-30 DIAGNOSIS — I10 Essential (primary) hypertension: Secondary | ICD-10-CM | POA: Diagnosis not present

## 2021-12-30 DIAGNOSIS — Z79899 Other long term (current) drug therapy: Secondary | ICD-10-CM | POA: Diagnosis not present

## 2021-12-30 DIAGNOSIS — M19042 Primary osteoarthritis, left hand: Secondary | ICD-10-CM | POA: Diagnosis present

## 2021-12-30 DIAGNOSIS — E872 Acidosis, unspecified: Secondary | ICD-10-CM

## 2021-12-30 DIAGNOSIS — R627 Adult failure to thrive: Secondary | ICD-10-CM | POA: Diagnosis present

## 2021-12-30 DIAGNOSIS — R0609 Other forms of dyspnea: Secondary | ICD-10-CM | POA: Diagnosis not present

## 2021-12-30 DIAGNOSIS — G20A1 Parkinson's disease without dyskinesia, without mention of fluctuations: Secondary | ICD-10-CM | POA: Diagnosis present

## 2021-12-30 DIAGNOSIS — E860 Dehydration: Secondary | ICD-10-CM | POA: Diagnosis not present

## 2021-12-30 DIAGNOSIS — E669 Obesity, unspecified: Secondary | ICD-10-CM | POA: Diagnosis not present

## 2021-12-30 DIAGNOSIS — Z7984 Long term (current) use of oral hypoglycemic drugs: Secondary | ICD-10-CM

## 2021-12-30 DIAGNOSIS — E66811 Obesity, class 1: Secondary | ICD-10-CM | POA: Diagnosis present

## 2021-12-30 DIAGNOSIS — E1169 Type 2 diabetes mellitus with other specified complication: Secondary | ICD-10-CM | POA: Diagnosis present

## 2021-12-30 DIAGNOSIS — E785 Hyperlipidemia, unspecified: Secondary | ICD-10-CM | POA: Diagnosis present

## 2021-12-30 DIAGNOSIS — G47419 Narcolepsy without cataplexy: Secondary | ICD-10-CM | POA: Diagnosis present

## 2021-12-30 DIAGNOSIS — K5732 Diverticulitis of large intestine without perforation or abscess without bleeding: Secondary | ICD-10-CM | POA: Diagnosis present

## 2021-12-30 DIAGNOSIS — K8500 Idiopathic acute pancreatitis without necrosis or infection: Secondary | ICD-10-CM

## 2021-12-30 DIAGNOSIS — E876 Hypokalemia: Secondary | ICD-10-CM | POA: Diagnosis not present

## 2021-12-30 DIAGNOSIS — M19041 Primary osteoarthritis, right hand: Secondary | ICD-10-CM | POA: Diagnosis present

## 2021-12-30 DIAGNOSIS — K5791 Diverticulosis of intestine, part unspecified, without perforation or abscess with bleeding: Secondary | ICD-10-CM

## 2021-12-30 DIAGNOSIS — E1165 Type 2 diabetes mellitus with hyperglycemia: Secondary | ICD-10-CM | POA: Diagnosis present

## 2021-12-30 DIAGNOSIS — Z950 Presence of cardiac pacemaker: Secondary | ICD-10-CM

## 2021-12-30 DIAGNOSIS — R296 Repeated falls: Secondary | ICD-10-CM | POA: Diagnosis present

## 2021-12-30 DIAGNOSIS — I2510 Atherosclerotic heart disease of native coronary artery without angina pectoris: Secondary | ICD-10-CM | POA: Diagnosis present

## 2021-12-30 HISTORY — DX: Diverticulitis of intestine, part unspecified, without perforation or abscess without bleeding: K57.92

## 2021-12-30 LAB — RESP PANEL BY RT-PCR (FLU A&B, COVID) ARPGX2
Influenza A by PCR: NEGATIVE
Influenza B by PCR: NEGATIVE
SARS Coronavirus 2 by RT PCR: NEGATIVE

## 2021-12-30 LAB — URINALYSIS, ROUTINE W REFLEX MICROSCOPIC
Bilirubin Urine: NEGATIVE
Glucose, UA: NEGATIVE mg/dL
Ketones, ur: 5 mg/dL — AB
Leukocytes,Ua: NEGATIVE
Nitrite: NEGATIVE
Protein, ur: 30 mg/dL — AB
Specific Gravity, Urine: 1.026 (ref 1.005–1.030)
pH: 5 (ref 5.0–8.0)

## 2021-12-30 LAB — COMPREHENSIVE METABOLIC PANEL
ALT: 16 U/L (ref 0–44)
AST: 24 U/L (ref 15–41)
Albumin: 4 g/dL (ref 3.5–5.0)
Alkaline Phosphatase: 64 U/L (ref 38–126)
Anion gap: 11 (ref 5–15)
BUN: 31 mg/dL — ABNORMAL HIGH (ref 8–23)
CO2: 24 mmol/L (ref 22–32)
Calcium: 9.6 mg/dL (ref 8.9–10.3)
Chloride: 102 mmol/L (ref 98–111)
Creatinine, Ser: 0.97 mg/dL (ref 0.61–1.24)
GFR, Estimated: >60 mL/min (ref >60)
Glucose, Bld: 185 mg/dL — ABNORMAL HIGH (ref 70–99)
Potassium: 4.9 mmol/L (ref 3.5–5.1)
Sodium: 137 mmol/L (ref 135–145)
Total Bilirubin: 0.8 mg/dL (ref 0.3–1.2)
Total Protein: 7.6 g/dL (ref 6.5–8.1)

## 2021-12-30 LAB — CBC
HCT: 43.6 % (ref 39.0–52.0)
Hemoglobin: 14.7 g/dL (ref 13.0–17.0)
MCH: 30 pg (ref 26.0–34.0)
MCHC: 33.7 g/dL (ref 30.0–36.0)
MCV: 89 fL (ref 80.0–100.0)
Platelets: 231 10*3/uL (ref 150–400)
RBC: 4.9 MIL/uL (ref 4.22–5.81)
RDW: 13.1 % (ref 11.5–15.5)
WBC: 22 10*3/uL — ABNORMAL HIGH (ref 4.0–10.5)
nRBC: 0 % (ref 0.0–0.2)

## 2021-12-30 LAB — LACTIC ACID, PLASMA
Lactic Acid, Venous: 2.7 mmol/L (ref 0.5–1.9)
Lactic Acid, Venous: 2.6 mmol/L (ref 0.5–1.9)

## 2021-12-30 LAB — LIPASE, BLOOD: Lipase: 165 U/L — ABNORMAL HIGH (ref 11–51)

## 2021-12-30 LAB — GLUCOSE, CAPILLARY: Glucose-Capillary: 193 mg/dL — ABNORMAL HIGH (ref 70–99)

## 2021-12-30 MED ORDER — MELATONIN 5 MG PO TABS
10.0000 mg | ORAL_TABLET | Freq: Every day | ORAL | Status: DC
  Administered 2021-12-30 – 2022-01-07 (×9): 10 mg via ORAL
  Filled 2021-12-30 (×9): qty 2

## 2021-12-30 MED ORDER — NITROGLYCERIN 0.4 MG SL SUBL: 0.4000 mg | SUBLINGUAL_TABLET | SUBLINGUAL | Status: DC | PRN

## 2021-12-30 MED ORDER — SODIUM CHLORIDE (PF) 0.9 % IJ SOLN
INTRAMUSCULAR | Status: AC
  Filled 2021-12-30: qty 50

## 2021-12-30 MED ORDER — SODIUM CHLORIDE 0.9 % IV SOLN
2.0000 g | INTRAVENOUS | Status: DC
  Administered 2021-12-30 – 2022-01-04 (×6): 2 g via INTRAVENOUS
  Filled 2021-12-30 (×7): qty 20

## 2021-12-30 MED ORDER — LACTATED RINGERS IV SOLN: INTRAVENOUS | Status: DC

## 2021-12-30 MED ORDER — ACETAMINOPHEN 500 MG PO TABS
1000.0000 mg | ORAL_TABLET | Freq: Two times a day (BID) | ORAL | Status: DC
  Administered 2021-12-30 – 2022-01-08 (×18): 1000 mg via ORAL
  Filled 2021-12-30 (×18): qty 2

## 2021-12-30 MED ORDER — OMEGA-3-ACID ETHYL ESTERS 1 G PO CAPS
1.0000 g | ORAL_CAPSULE | Freq: Every day | ORAL | Status: DC
  Administered 2021-12-31 – 2022-01-08 (×9): 1 g via ORAL
  Filled 2021-12-30 (×9): qty 1

## 2021-12-30 MED ORDER — IOHEXOL 300 MG/ML  SOLN
100.0000 mL | Freq: Once | INTRAMUSCULAR | Status: AC | PRN
  Administered 2021-12-30: 100 mL via INTRAVENOUS

## 2021-12-30 MED ORDER — LACTATED RINGERS IV SOLN: INTRAVENOUS | Status: AC

## 2021-12-30 MED ORDER — LACTATED RINGERS IV BOLUS
1000.0000 mL | Freq: Once | INTRAVENOUS | Status: AC
  Administered 2021-12-30: 1000 mL via INTRAVENOUS

## 2021-12-30 MED ORDER — CARBIDOPA-LEVODOPA 25-100 MG PO TABS: 1.0000 | ORAL_TABLET | Freq: Three times a day (TID) | ORAL | Status: DC

## 2021-12-30 MED ORDER — METOPROLOL TARTRATE 25 MG PO TABS
25.0000 mg | ORAL_TABLET | Freq: Two times a day (BID) | ORAL | Status: DC
  Administered 2021-12-30 – 2022-01-08 (×18): 25 mg via ORAL
  Filled 2021-12-30 (×18): qty 1

## 2021-12-30 MED ORDER — CARBIDOPA-LEVODOPA 25-100 MG PO TABS
1.0000 | ORAL_TABLET | Freq: Three times a day (TID) | ORAL | Status: DC
  Administered 2021-12-30 – 2022-01-08 (×26): 1 via ORAL
  Filled 2021-12-30 (×26): qty 1

## 2021-12-30 MED ORDER — OMEGA 3 1200 MG PO CAPS: 1200.0000 mg | ORAL_CAPSULE | Freq: Every day | ORAL | Status: DC

## 2021-12-30 MED ORDER — HALOPERIDOL LACTATE 5 MG/ML IJ SOLN: 1.0000 mg | Freq: Four times a day (QID) | INTRAMUSCULAR | Status: DC | PRN

## 2021-12-30 MED ORDER — METRONIDAZOLE 500 MG/100ML IV SOLN
500.0000 mg | Freq: Two times a day (BID) | INTRAVENOUS | Status: DC
  Administered 2021-12-30 – 2022-01-05 (×12): 500 mg via INTRAVENOUS
  Filled 2021-12-30 (×12): qty 100

## 2021-12-30 MED ORDER — ADULT MULTIVITAMIN W/MINERALS CH
1.0000 | ORAL_TABLET | Freq: Every day | ORAL | Status: DC
  Administered 2021-12-31 – 2022-01-08 (×9): 1 via ORAL
  Filled 2021-12-30 (×9): qty 1

## 2021-12-30 MED ORDER — TRAMADOL HCL 50 MG PO TABS: 50.0000 mg | ORAL_TABLET | Freq: Four times a day (QID) | ORAL | Status: DC | PRN

## 2021-12-30 MED ORDER — QUETIAPINE FUMARATE 25 MG PO TABS
12.5000 mg | ORAL_TABLET | Freq: Every day | ORAL | Status: DC
  Administered 2021-12-30 – 2022-01-07 (×9): 12.5 mg via ORAL
  Filled 2021-12-30 (×9): qty 1

## 2021-12-30 MED ORDER — POLYETHYLENE GLYCOL 3350 17 G PO PACK
17.0000 g | PACK | Freq: Every day | ORAL | Status: DC
Start: 2021-12-31 — End: 2022-01-08
  Administered 2021-12-31 – 2022-01-08 (×9): 17 g via ORAL
  Filled 2021-12-30 (×9): qty 1

## 2021-12-30 MED ORDER — ALIGN PO CAPS: 1.0000 | ORAL_CAPSULE | Freq: Every day | ORAL | Status: DC

## 2021-12-30 MED ORDER — ENOXAPARIN SODIUM 40 MG/0.4ML IJ SOSY
40.0000 mg | PREFILLED_SYRINGE | INTRAMUSCULAR | Status: DC
  Administered 2021-12-30 – 2022-01-03 (×5): 40 mg via SUBCUTANEOUS
  Filled 2021-12-30 (×5): qty 0.4

## 2021-12-30 MED ORDER — VITAMIN D 25 MCG (1000 UNIT) PO TABS
1000.0000 [IU] | ORAL_TABLET | Freq: Every day | ORAL | Status: DC
  Administered 2021-12-31 – 2022-01-08 (×9): 1000 [IU] via ORAL
  Filled 2021-12-30 (×9): qty 1

## 2021-12-30 MED ORDER — METRONIDAZOLE 500 MG/100ML IV SOLN
500.0000 mg | Freq: Once | INTRAVENOUS | Status: AC
  Administered 2021-12-30: 500 mg via INTRAVENOUS
  Filled 2021-12-30: qty 100

## 2021-12-30 MED ORDER — SENNOSIDES-DOCUSATE SODIUM 8.6-50 MG PO TABS
1.0000 | ORAL_TABLET | Freq: Two times a day (BID) | ORAL | Status: DC
Start: 2021-12-30 — End: 2022-01-08
  Administered 2021-12-30 – 2022-01-08 (×18): 1 via ORAL
  Filled 2021-12-30 (×15): qty 1

## 2021-12-30 MED ORDER — CARBIDOPA-LEVODOPA ER 25-100 MG PO TBCR
1.0000 | EXTENDED_RELEASE_TABLET | Freq: Every day | ORAL | Status: DC
  Administered 2021-12-30 – 2022-01-07 (×9): 1 via ORAL
  Filled 2021-12-30 (×10): qty 1

## 2021-12-30 MED ORDER — INSULIN ASPART 100 UNIT/ML IJ SOLN
0.0000 [IU] | Freq: Three times a day (TID) | INTRAMUSCULAR | Status: DC
  Administered 2021-12-31: 2 [IU] via SUBCUTANEOUS
  Administered 2021-12-31 (×2): 3 [IU] via SUBCUTANEOUS
  Administered 2022-01-01: 2 [IU] via SUBCUTANEOUS
  Administered 2022-01-01: 3 [IU] via SUBCUTANEOUS
  Administered 2022-01-01: 2 [IU] via SUBCUTANEOUS
  Administered 2022-01-02: 3 [IU] via SUBCUTANEOUS
  Administered 2022-01-02 – 2022-01-03 (×3): 2 [IU] via SUBCUTANEOUS
  Administered 2022-01-03: 5 [IU] via SUBCUTANEOUS
  Administered 2022-01-04 – 2022-01-05 (×6): 3 [IU] via SUBCUTANEOUS
  Administered 2022-01-06: 2 [IU] via SUBCUTANEOUS
  Administered 2022-01-06 (×2): 3 [IU] via SUBCUTANEOUS
  Administered 2022-01-07 (×2): 5 [IU] via SUBCUTANEOUS
  Administered 2022-01-07 – 2022-01-08 (×2): 3 [IU] via SUBCUTANEOUS

## 2021-12-30 MED ORDER — CO Q-10 30 MG PO CAPS: ORAL_CAPSULE | Freq: Every day | ORAL | Status: DC

## 2021-12-30 MED ORDER — ATORVASTATIN CALCIUM 10 MG PO TABS
20.0000 mg | ORAL_TABLET | Freq: Every day | ORAL | Status: DC
  Administered 2021-12-31 – 2022-01-08 (×9): 20 mg via ORAL
  Filled 2021-12-30 (×9): qty 2

## 2021-12-30 MED ORDER — VANCOMYCIN HCL IN DEXTROSE 1-5 GM/200ML-% IV SOLN
1000.0000 mg | Freq: Once | INTRAVENOUS | Status: AC
  Administered 2021-12-30: 1000 mg via INTRAVENOUS
  Filled 2021-12-30: qty 200

## 2021-12-30 MED ORDER — SODIUM CHLORIDE 0.9 % IV SOLN
2.0000 g | Freq: Once | INTRAVENOUS | Status: AC
  Administered 2021-12-30: 2 g via INTRAVENOUS
  Filled 2021-12-30: qty 2

## 2021-12-30 NOTE — Sepsis Progress Note (Signed)
eLink monitoring code sepsis.  

## 2021-12-30 NOTE — Progress Notes (Signed)
PHARMACIST - PHYSICIAN ORDER COMMUNICATION ? ?CONCERNING: P&T Medication Policy on Herbal Medications ? ?DESCRIPTION:  This patient?s order for:  Align, Co-Q 10  has been noted. ? ?This product(s) is classified as an ?herbal? or natural product. ?Due to a lack of definitive safety studies or FDA approval, nonstandard manufacturing practices, plus the potential risk of unknown drug-drug interactions while on inpatient medications, the Pharmacy and Therapeutics Committee does not permit the use of ?herbal? or natural products of this type within [facility] ?  ?ACTION TAKEN: ?The pharmacy department is unable to verify this order at this time and your patient has been informed of this safety policy. ?Please reevaluate patient?s clinical condition at discharge and address if the herbal or natural product(s) should be resumed at that time. ? ?Dimple Nanas, PharmD ?12/30/2021 5:49 PM ? ?

## 2021-12-30 NOTE — ED Provider Notes (Signed)
?[facility] DEPT ?Provider Note ? ? ?CSN: [id number] ?Arrival date & time: 12/30/21  1036 ? ?  ? ?History ? ?Chief Complaint  ?Patient presents with  ? Abdominal Pain  ? ? ?Jordan [name] is a 81 y.o. male. ? ?81 year old male presents with generalized abdominal pain which started yesterday.  Patient had kyphoplasty done yesterday.  States has had no new numbness or tingling in his legs.  Unsure if he has had a fever.  Was prescribed pain medication but not take those.  States he does feel somewhat sleepy.  EMS was called and patient had low O2 sat and was placed on oxygen.  CBG was 191 and patient transported here ? ? ?  ? ?Home Medications ?Prior to Admission medications   ?Medication Sig Start Date End Date Taking? Authorizing Provider  ?acetaminophen (TYLENOL) 500 MG tablet Take 1,000 mg by mouth 2 (two) times daily.    [provider]  ?amphetamine-dextroamphetamine (ADDERALL) 10 MG tablet Take 10 mg by mouth daily as needed (help control symptoms of parkinsons).    [provider]  ?atorvastatin (LIPITOR) 40 MG tablet Take 0.5 tablets (20 mg total) by mouth daily. 07/30/21   Medina-Vargas, Senaida Lange, NP  ?bifidobacterium infantis (ALIGN) capsule Take 1 capsule by mouth daily.    [provider]  ?carbidopa-levodopa (SINEMET) 25-100 MG tablet Take 1 tablet by mouth 3 (three) times daily. 09/14/21   Frann Rider, NP  ?cholecalciferol (VITAMIN D3) 25 MCG (1000 UNIT) tablet Take 1,000 Units by mouth daily.    [provider]  ?Coenzyme Q10 (CO Q-10 PO) Take 1 capsule by mouth daily.    [provider]  ?lidocaine (LIDODERM) 5 % Place 1 patch onto the skin in the morning and at bedtime. 11/26/21   [provider]  ?lisinopril (ZESTRIL) 10 MG tablet Take 1 tablet (10 mg total) by mouth daily. ?Patient taking differently: Take 20 mg by mouth daily. 07/30/21   Medina-Vargas, Senaida Lange, NP  ?Melatonin 10 MG TABS Take 10 mg by mouth at  bedtime.    [provider]  ?metFORMIN (GLUCOPHAGE) 500 MG tablet Take 500 mg by mouth. 4 tables once a day    [provider]  ?metoprolol tartrate (LOPRESSOR) 25 MG tablet Take 1 tablet (25 mg total) by mouth 2 (two) times daily. 07/30/21   Medina-Vargas, Senaida Lange, NP  ?Multiple Vitamin (MULTIVITAMIN WITH MINERALS) TABS tablet Take 1 tablet by mouth daily. 02/19/18   Georgette Shell, MD  ?NITROSTAT 0.4 MG SL tablet PLACE 1 TABLET (0.4 MG TOTAL) UNDER THE TONGUE EVERY 5 (FIVE) MINUTES AS NEEDED FOR CHEST PAIN. ?Patient taking differently: Place 0.4 mg under the tongue every 5 (five) minutes as needed for chest pain. 03/31/16   Belva Crome, MD  ?Omega 3 1200 MG CAPS Take 1,200 mg by mouth daily.    [provider]  ?PRESCRIPTION MEDICATION See admin instructions. CPAP- At bedtime    [provider]  ?QUEtiapine (SEROQUEL) 25 MG tablet Take 1/2 tablets (12.5 mg total) by mouth at bedtime. 12/23/21 01/22/22  Arrien, Jimmy Picket, MD  ?traMADol (ULTRAM) 50 MG tablet Take 50 mg by mouth every 6 (six) hours as needed for moderate pain (lower back pain).    [provider]  ?   ? ?Allergies    ?Patient has no allergy information on record.   ? ?Review of Systems   ?Review of Systems  ?All other systems reviewed and are negative. ? ?  Physical Exam ?Updated Vital Signs ?BP (!) 165/89 (BP Location: Left Arm)   Pulse 82   Temp 99.7 ?F (37.6 ?C) (Oral)   Resp (!) 21   SpO2 94%  ?Physical Exam ?Vitals and nursing note reviewed.  ?Constitutional:   ?   General: He is not in acute distress. ?   Appearance: Normal appearance. He is well-developed. He is not toxic-appearing.  ?HENT:  ?   Head: Normocephalic and atraumatic.  ?Eyes:  ?   General: Lids are normal.  ?   Conjunctiva/sclera: Conjunctivae normal.  ?   Pupils: Pupils are equal, round, and reactive to light.  ?Neck:  ?   Thyroid: No thyroid mass.  ?   Trachea: No tracheal deviation.  ?Cardiovascular:  ?   Rate and  Rhythm: Normal rate and regular rhythm.  ?   Heart sounds: Normal heart sounds. No murmur heard. ?  No gallop.  ?Pulmonary:  ?   Effort: Pulmonary effort is normal. No respiratory distress.  ?   Breath sounds: Normal breath sounds. No stridor. No decreased breath sounds, wheezing, rhonchi or rales.  ?Abdominal:  ?   General: There is no distension.  ?   Palpations: Abdomen is soft.  ?   Tenderness: There is no abdominal tenderness. There is no rebound.  ?Musculoskeletal:     ?   General: No tenderness. Normal range of motion.  ?   Cervical back: Normal range of motion and neck supple.  ?   Comments: Nontender to palpation along thoracic and lumbar spine.  ?Skin: ?   General: Skin is warm and dry.  ?   Findings: No abrasion or rash.  ?Neurological:  ?   General: No focal deficit present.  ?   Mental Status: He is oriented to person, place, and time. Mental status is at baseline. He is lethargic.  ?   GCS: GCS eye subscore is 4. GCS verbal subscore is 5. GCS motor subscore is 6.  ?   Cranial Nerves: No cranial nerve deficit.  ?   Sensory: No sensory deficit.  ?   Motor: Motor function is intact.  ?   Comments: Strength is 5 of 5 in upper as well as lower extremities  ?Psychiatric:     ?   Attention and Perception: Attention normal.     ?   Mood and Affect: Affect is flat.     ?   Speech: Speech is delayed.  ? ? ?ED Results / Procedures / Treatments   ?Labs ?(all labs ordered are listed, but only abnormal results are displayed) ?Labs Reviewed  ?CULTURE, BLOOD (ROUTINE X 2)  ?CULTURE, BLOOD (ROUTINE X 2)  ?RESP PANEL BY RT-PCR (FLU A&B, COVID) ARPGX2  ?LIPASE, BLOOD  ?COMPREHENSIVE METABOLIC PANEL  ?CBC  ?URINALYSIS, ROUTINE W REFLEX MICROSCOPIC  ?LACTIC ACID, PLASMA  ?LACTIC ACID, PLASMA  ? ? ?EKG ?None ? ?Radiology ?No results found. ? ?Procedures ?Procedures  ? ? ?Medications Ordered in ED ?Medications  ?lactated ringers bolus 1,000 mL (has no administration in time range)  ?lactated ringers infusion (has no  administration in time range)  ? ? ?ED Course/ Medical Decision Making/ A&P ?  ?                        ?Medical Decision Making ?Amount and/or Complexity of Data Reviewed ?Labs: ordered. ?Radiology: ordered. ? ?Risk ?Prescription drug management. ?Decision regarding hospitalization. ? ? ?Patient presented with confusion and generalized abdominal pain.  Concern for possible intra-abdominal pathologies such as abscess versus colonic involvement.  Patient given IV fluids.  Patient had elevated lactate and code sepsis was activated.  Started on empiric IV antibiotics.  Abdominal CT shows pancreatitis versus diverticular abscess per my review and interpretation.  Discussed with patient's spouse and patient will require hospitalization.  Discussed with hospitalist who will admit the patient ? ? ? ? ? ? ? ?Final Clinical Impression(s) / ED Diagnoses ?Final diagnoses:  ?None  ? ? ?Rx / DC Orders ?ED Discharge Orders   ? ? None  ? ?  ? ? ?  ?Lacretia Leigh, MD ?12/31/21 [phone] ? ?

## 2021-12-30 NOTE — Progress Notes (Signed)
Patient's wife attempted to place her husband on his home cpap, but he was fighting it and refused its application.  Wife stated she is staying the night and may attempt cpap later.  Wife was advised that RT is available all night should she/he need assistance.  RN aware. ?

## 2021-12-30 NOTE — ED Triage Notes (Signed)
Pt BIBA from home for generalized abdominal pain starting yesterday. Had back surgery yesterday at [facility], started on pain meds, unsure if causing NV. Has some diarrhea regularly. Did not take the pain meds today. Denies urinary sx or bleeding. ? ?BP 170s/130s ?HR 66 ?SpO2 92% RA ?CBG 191 ?

## 2021-12-30 NOTE — Progress Notes (Signed)
A consult was received from an ED physician for cefepime/vancomycin per pharmacy dosing.  The patient's profile has been reviewed for ht/wt/allergies/indication/available labs.   ?A one time order has been placed for cefepime 2g and vancomycin 1g.  Further antibiotics/pharmacy consults should be ordered by admitting physician if indicated.       ?                ?Thank you, ?Peggyann Juba, PharmD, BCPS ?12/30/2021  12:40 PM ? ?

## 2021-12-30 NOTE — H&P (Signed)
History and Physical    Jordan [name] CHE:[id number] DOB: 1941-02-25 DOA: 12/30/2021  PCP: [name], MD Patient coming from:   I have personally briefly reviewed patient's old medical records in [place]  Chief Complaint: ab pain   HPI: COLA HIGHFILL is a 81 y.o. male with medical history significant of hypertension ,noninsulin-dependent type 2 diabetes, CVA, Parkinson's, bradycardia-s/p PPM placement, OSA, narcolepsy, brought to ED by EMS due to due to abdominal pain, he was put on oxygen by EMS due to o2 sats at 107's, wife reports patient has sleep apnea  I have reviewed his record which showed, he was hospitalized from 2/17 to 2/22 due to hallucination, Zoloft and Klonopin was discontinued, he was started on Seroquel which helped hallucination, he underwent kyphoplasty on 2/28 went home started have stomach pain and dry heave , wife called EMS  Currently patient is tremorous wife states this is likely due to did not take his Sinemet today, currently patient report pain is much better, there is no fever, report had a regular bowel movement yesterday Denies dysuria Wife report patient has intermittent chronic cough which has not changed recently  ED Course: Blood pressure (!) 176/84, pulse 71, temperature 99.2 F (37.3 C), temperature source Rectal, resp. rate  range from 18-25, SpO2 97 %.  Data reviewed: -Chest x-ray with low lung volumes with left basilar atelectasis -CT abdomen concerning for pancreatitis versus proximal sigmoid colon diverticulitis -Blood culture obtained in the ED -Labs significant for leukocytosis, WBC 22, lactic acidosis, lactic acid 2.7, blood glucose 185, lipase 165, LFT unremarkable.  Creatinine unremarkable  UA is concentrated with specific gravity 1.026 ,showed large Hgb, 21-50 RBC positive ketone, rare bacteria  Review of Systems: As per HPI otherwise all other systems reviewed and are negative.   Past Medical History:  Diagnosis Date    Arthritis    R shoulder, bone spur   Arthritis    "hands" (03/25/2016)   Chronic diastolic heart failure (HCC)    Chronic lower back pain    Coronary heart disease    Dr Pernell Dupre   H/O cardiovascular stress test    perhaps last one was 2009   Heart murmur    12/29/11 echo: mild MR, no AS, trivial TR   History of gout    Hyperlipidemia    Hypertension    saw Dr. Linard Millers last earl;y- 2014, cardiac cath. last done ?2009, blocks seen didn't require any intervention at that point.   Narcolepsy    [name] 04-11-97; Mean Latency 1.32min, SOREM 2   OSA on CPAP    NPSG 12-13-98 AHI 22.7   PONV (postoperative nausea and vomiting)    Presence of permanent cardiac pacemaker    Shortness of breath    with exertion    Stroke ([place])    Type II diabetes mellitus ([place])     Past Surgical History:  Procedure Laterality Date   CARDIAC CATHETERIZATION  2009?   EP IMPLANTABLE DEVICE N/A 03/25/2016   Procedure: Pacemaker Implant - Dual Chamber;  Surgeon: Evans Lance, MD;  Location: [facility];  Service: Cardiovascular;  Laterality: N/A;   FRACTURE SURGERY     INSERT / REPLACE / REMOVE PACEMAKER  03/24/2016   IR RADIOLOGIST EVAL & MGMT  12/15/2021   LEFT AND RIGHT HEART CATHETERIZATION WITH CORONARY ANGIOGRAM N/A 06/05/2014   Procedure: LEFT AND RIGHT HEART CATHETERIZATION WITH CORONARY ANGIOGRAM;  Surgeon: Sinclair Grooms, MD;  Location: [facility];  Service: Cardiovascular;  Laterality: N/A;   NASAL FRACTURE SURGERY     SHOULDER ARTHROSCOPY WITH ROTATOR CUFF REPAIR AND SUBACROMIAL DECOMPRESSION Right 08/16/2013   Procedure: SHOULDER ARTHROSCOPY WITH ROTATOR CUFF REPAIR AND SUBACROMIAL DECOMPRESSION;  Surgeon: [name], MD;  Location: [facility];  Service: Orthopedics;  Laterality: Right;  Right shoulder arthroscopy rotator cuff repair, subacromial decompression.   TONSILLECTOMY      Social History  reports that he quit smoking about 54 years ago. His smoking use included  cigarettes. He has a 15.00 pack-year smoking history. He has never used smokeless tobacco. He reports current alcohol use. He reports that he does not use drugs.  No Known Allergies  Family History  Problem Relation Age of Onset   Sleep apnea Sister    Colon cancer Sister     Prior to Admission medications   Medication Sig Start Date End Date Taking? Authorizing Provider  acetaminophen (TYLENOL) 500 MG tablet Take 1,000 mg by mouth 2 (two) times daily.    [provider]  amphetamine-dextroamphetamine (ADDERALL) 10 MG tablet Take 10 mg by mouth daily as needed (help control symptoms of parkinsons).    [provider]  atorvastatin (LIPITOR) 40 MG tablet Take 0.5 tablets (20 mg total) by mouth daily. 07/30/21   Medina-Vargas, Monina C, NP  bifidobacterium infantis (ALIGN) capsule Take 1 capsule by mouth daily.    [provider]  carbidopa-levodopa (SINEMET) 25-100 MG tablet Take 1 tablet by mouth 3 (three) times daily. 09/14/21   Frann Rider, NP  cholecalciferol (VITAMIN D3) 25 MCG (1000 UNIT) tablet Take 1,000 Units by mouth daily.    [provider]  Coenzyme Q10 (CO Q-10 PO) Take 1 capsule by mouth daily.    [provider]  lidocaine (LIDODERM) 5 % Place 1 patch onto the skin in the morning and at bedtime. 11/26/21   [provider]  lisinopril (ZESTRIL) 10 MG tablet Take 1 tablet (10 mg total) by mouth daily. Patient taking differently: Take 20 mg by mouth daily. 07/30/21   Medina-Vargas, Monina C, NP  Melatonin 10 MG TABS Take 10 mg by mouth at bedtime.    [provider]  metFORMIN (GLUCOPHAGE) 500 MG tablet Take 500 mg by mouth. 4 tables once a day    [provider]  metoprolol tartrate (LOPRESSOR) 25 MG tablet Take 1 tablet (25 mg total) by mouth 2 (two) times daily. 07/30/21   Medina-Vargas, Monina C, NP  Multiple Vitamin (MULTIVITAMIN WITH MINERALS) TABS tablet Take 1 tablet by mouth daily. 02/19/18   Georgette Shell, MD  NITROSTAT 0.4 MG SL tablet PLACE 1 TABLET (0.4 MG TOTAL) UNDER THE TONGUE EVERY 5 (FIVE) MINUTES AS NEEDED FOR CHEST PAIN. Patient taking differently: Place 0.4 mg under the tongue every 5 (five) minutes as needed for chest pain. 03/31/16   Belva Crome, MD  Omega 3 1200 MG CAPS Take 1,200 mg by mouth daily.    [provider]  PRESCRIPTION MEDICATION See admin instructions. CPAP- At bedtime    [provider]  QUEtiapine (SEROQUEL) 25 MG tablet Take 1/2 tablets (12.5 mg total) by mouth at bedtime. 12/23/21 01/22/22  Arrien, Jimmy Picket, MD  traMADol (ULTRAM) 50 MG tablet Take 50 mg by mouth every 6 (six) hours as needed for moderate pain (lower back pain).    [provider]    Physical Exam: Vitals:   12/30/21 1504 12/30/21 1530 12/30/21 1554 12/30/21 1600  BP: (!) 176/84 (!) 158/85  [name])  157/89  Pulse: 71 65  68  Resp: 18 (!) 22  (!) 25  Temp:   98 F (36.7 C)   TempSrc:   Oral   SpO2: 97% 98%  99%    Constitutional: Frail, tremorous, states he is at home currently, oriented to time and person Eyes: PERRL, lids and conjunctivae normal ENMT: Mucous membranes are moist.  Respiratory: clear to auscultation bilaterally, no wheezing, no crackles. Normal respiratory effort. No accessory muscle use.  Cardiovascular: Regular rate and rhythm,  No extremity edema. 2+ pedal pulses. + precoridal murmur , reports chronic by wife  Abdomen: no tenderness, not distended, Bowel sounds positive.  Musculoskeletal: no clubbing / cyanosis. No joint deformity upper and lower extremities. Good ROM, no contractures. Normal muscle tone.  Skin: no rashes, lesions, ulcers. No induration Neurologic: Tremorous, slightly confused.  Psychiatric: No agitation.    Labs on Admission: I have personally reviewed following labs and imaging studies  CBC: Recent Labs  Lab 12/30/21 1053  WBC 22.0*  HGB 14.7  HCT 43.6  MCV 89.0  PLT 938    Basic Metabolic  Panel: Recent Labs  Lab 12/30/21 1053  NA 137  K 4.9  CL 102  CO2 24  GLUCOSE 185*  BUN 31*  CREATININE 0.97  CALCIUM 9.6    GFR: Estimated Creatinine Clearance: 66.7 mL/min (by C-G formula based on SCr of 0.97 mg/dL).  Liver Function Tests: Recent Labs  Lab 12/30/21 1053  AST 24  ALT 16  ALKPHOS 64  BILITOT 0.8  PROT 7.6  ALBUMIN 4.0    Urine analysis:    Component Value Date/Time   COLORURINE YELLOW 12/30/2021 1053   APPEARANCEUR CLEAR 12/30/2021 1053   LABSPEC 1.026 12/30/2021 1053   PHURINE 5.0 12/30/2021 1053   GLUCOSEU NEGATIVE 12/30/2021 1053   HGBUR LARGE (A) 12/30/2021 1053   BILIRUBINUR NEGATIVE 12/30/2021 1053   KETONESUR 5 (A) 12/30/2021 1053   PROTEINUR 30 (A) 12/30/2021 1053   NITRITE NEGATIVE 12/30/2021 1053   LEUKOCYTESUR NEGATIVE 12/30/2021 1053    Radiological Exams on Admission: CT Abdomen Pelvis W Contrast  Result Date: 12/30/2021 CLINICAL DATA:  Acute nonlocalized abdominal pain starting yesterday. Had back surgery yesterday started on pain meds. Nausea and vomiting. Has some diarrhea irregularity. EXAM: CT ABDOMEN AND PELVIS WITH CONTRAST TECHNIQUE: Multidetector CT imaging of the abdomen and pelvis was performed using the standard protocol following bolus administration of intravenous contrast. RADIATION DOSE REDUCTION: This exam was performed according to the departmental dose-optimization program which includes automated exposure control, adjustment of the mA and/or kV according to patient size and/or use of iterative reconstruction technique. CONTRAST:  16mL OMNIPAQUE IOHEXOL 300 MG/ML  SOLN COMPARISON:  CT abdomen and pelvis without contrast 10/29/2021; CT abdomen and pelvis without and with contrast 01/23/2014 FINDINGS: Lower chest: Small right and trace left pleural effusions new from 10/29/2021. Associated mild subsegmental atelectasis. Heart size is mildly to moderately enlarged. Coronary artery calcifications and cardiac pacer wires are  noted. 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 Hepatobiliary: Smooth liver contours. There are 3 adjacent oval lesions versus a single multilobular lesion within the posterior right hepatic lobe measuring up to approximally 4.2 x 2.3 x 4.2 cm (transverse by AP by craniocaudal), not significantly changed from 10/29/2021 within limitations of lack of IV contrast on the prior study. In this region there is again a 4 mm dystrophic calcification. Again the calcification is unchanged but the low-density lobular lesion is new  compared to 01/23/2014. a more inferior posterior right hepatic lobe 17 mm low-attenuation lesion (axial series 3, image 28) is unchanged from 01/23/2014. More superior anterior right hepatic lobe 10 mm low-attenuation lesion (axial series 3, image 20) is unchanged from 01/23/2014. The gallbladder is unremarkable. No intrahepatic or extrahepatic biliary ductal dilatation. Pancreas: There is new swelling and surrounding moderate inflammatory fat stranding about the pancreatic tail (axial series 3, images 26-33). There is also mild thickening of the adjacent splenic flexure colon in this region. Spleen: Normal in size without focal abnormality. Adrenals/Urinary Tract: Adrenal glands are unremarkable. Mild-to-moderate sigmoid and descending colon diverticulosis. As described above there is inflammatory change in the region of the pancreatic tail and adjacent hepatic flexure of the colon. The kidneys enhance uniformly and are symmetric in size without hydronephrosis. No renal stone is seen. Posterior inferior right 2.9 cm and mid to superior left 2.4 cm fluid attenuation renal cysts are seen. There are again multiple punctate left-greater-than-right nonobstructing renal stones. Left renal pelvis partially obstructing 12 mm stone is unchanged from 01/23/2014 with unchanged long-term stable minimal left pelvicaliectasis. The urinary bladder is grossly  unremarkable, within limitation of underdistention. Stomach/Bowel: No focal bowel wall thickening. The terminal ileum is unremarkable. The appendix appears within normal limits. No dilated loops of bowel to indicate bowel obstruction. Vascular/Lymphatic: No abdominal aortic aneurysm. Moderate atherosclerotic calcification. No mesenteric, retroperitoneal, or pelvic lymphadenopathy. Reproductive: The prostate and seminal vesicles are grossly unremarkable. Other: No abdominal wall hernia or abnormality.No abdominopelvic ascites. No pneumoperitoneum. Musculoskeletal: Mild levocurvature centered at L2. There is new cement seen within L2 vertebral body interval vertebroplasty. Moderate L2 vertebral body height loss is similar to prior. There moderate multilevel degenerative disc changes greatest at L4-5. Mild progressive healing sclerosis of a subacute to older left inferior pubic ramus fracture (axial series 3, image 99). IMPRESSION: Compared to 10/29/2021: 1. Interval L2 vertebral body vertebroplasty. 2. There is new inflammatory change within the pancreatic tail and surrounding mesenteric fat extending to the hepatic flexure of the colon where there is also mild wall thickening and inflammatory change. Mild distal colonic diverticulosis. The inflammatory changes may represent primary pancreatitis with secondary inflammation of the hepatic flexure of the colon, or primary proximal sigmoid colon diverticulitis with secondary extension to the pancreatic tail. 3. No abscess or drainable fluid collection. 4. Subacute or older inferior left pubic ramus nondisplaced fracture. Electronically Signed   By: Yvonne Kendall M.D.   On: 12/30/2021 14:30   DG Chest Port 1 View  Result Date: 12/30/2021 CLINICAL DATA:  Cough.  Generalized abdominal pain. EXAM: PORTABLE CHEST 1 VIEW COMPARISON:  12/18/2021 FINDINGS: A dual lead pacemaker remains in place. The cardiac silhouette remains enlarged. Aortic atherosclerosis is noted. Lung  volumes remain low with mild left basilar opacity which is stable to slightly increased. No edema, sizable pleural effusion, or pneumothorax is identified. No acute osseous abnormality is seen. IMPRESSION: Low lung volumes with left basilar atelectasis. Electronically Signed   By: Logan Bores M.D.   On: 12/30/2021 11:46      Assessment/Plan Principal Problem:   Sepsis ([place])   Sepsis present on admission with tachypnea, leukocytosis, lactic acidosis Pancreatitis versus proximal sigmoid colon diverticulitis, he does has elevated lipase, normal LFT Blood culture obtained in the ED in process COVID screening negative obtain urine culture Get MRSA screen He received vancomycin, cefepime, Flagyl in the ED,  Continue Rocephin Flagyl for now, continue hydration, start full liquid diet, repeat lipase in the morning, advance diet as  tolerated Follow-up on cultures  He is slightly confused currently, possible component of acute metabolic encephalopathy Wife report patient had agitation episode during last hospitalization Wife will stay with patient tonight, she does not want patient to get prn Haldol or put on restraint  unless she gave permission Delirium precaution  Non-insulin-dependent type 2 diabetes Hold metformin Keep on SSI  Hypertension Continue metoprolol, hold lisinopril in the setting of elevation of lipase Add as needed hydralazine  CVA/Parkinson's Tend to lean towards right side which is chronic per wife Continue home medication  OSA continue CPAP  Hepatic lesions appear to be mostly stable except a new low density lobular lesion Detail please see CT ab/pel obtained on admission  Bilateral nonobstructing renal stones Renal function unremarkable  L2 kyphoplasty On 2/28  Subacute or older inferior left pubic ramus nondisplaced fracture Physical therapy  FTT: since 06/2021 when he was hospitalized for left acetabular fracture.  will get PT eval, wife desires CRR  placement if meeting criteria   DVT prophylaxis:   Lovenox   Code Status:  Full code, verified with wife at bedside   Family Communication:    Patient is from: Home   Anticipated DC to: TBD   Anticipated DC date: TBD    Consults called:  None Admission status:  Inpatient  Severity of Illness:   The appropriate patient status for this patient is INPATIENT due to history and comorbidities, severity of illness, required intensity of service to ensure the patient's safety and to avoid risk of adverse events/further clinical deterioration.  Severity of illness/comorbidities: Leukocytosis, lactic acidosis, encephalopathy, pancreatitis, diverticulitis Intensity of service: tests, high frequency of surveillance, interventions It is not anticipated that the patient will be medically stable for discharge from the hospital within 2 midnights of admission.    Voice Recognition Viviann Spare dictation system was used to create this note, attempts have been made to correct errors. Please contact the author with questions and/or clarifications.  Florencia Reasons MD PhD FACP [facility]  How to contact the [facility] Attending or Consulting provider [place] or covering provider during after hours [place], for this patient?   Check the care team in [facility] and look for a) attending/consulting [facility] provider listed and b) the [facility] team listed Log into [email or web address] and use [facility]'s universal password to access. If you do not have the password, please contact the hospital operator. Locate the [facility] provider you are looking for under [facility] and page to a number that you can be directly reached. If you still have difficulty reaching the provider, please page the [facility] (Director on Call) for the Hospitalists listed on amion for assistance.  12/30/2021, 5:53 PM

## 2021-12-31 ENCOUNTER — Encounter (HOSPITAL_COMMUNITY): Payer: Self-pay | Admitting: Internal Medicine

## 2021-12-31 DIAGNOSIS — E119 Type 2 diabetes mellitus without complications: Secondary | ICD-10-CM | POA: Diagnosis not present

## 2021-12-31 DIAGNOSIS — G4733 Obstructive sleep apnea (adult) (pediatric): Secondary | ICD-10-CM | POA: Diagnosis not present

## 2021-12-31 DIAGNOSIS — G20 Parkinson's disease: Secondary | ICD-10-CM

## 2021-12-31 DIAGNOSIS — Z950 Presence of cardiac pacemaker: Secondary | ICD-10-CM

## 2021-12-31 DIAGNOSIS — Z9989 Dependence on other enabling machines and devices: Secondary | ICD-10-CM

## 2021-12-31 LAB — COMPREHENSIVE METABOLIC PANEL
ALT: 10 U/L (ref 0–44)
AST: 27 U/L (ref 15–41)
Albumin: 3.5 g/dL (ref 3.5–5.0)
Alkaline Phosphatase: 55 U/L (ref 38–126)
Anion gap: 9 (ref 5–15)
BUN: 24 mg/dL — ABNORMAL HIGH (ref 8–23)
CO2: 25 mmol/L (ref 22–32)
Calcium: 8.9 mg/dL (ref 8.9–10.3)
Chloride: 100 mmol/L (ref 98–111)
Creatinine, Ser: 0.77 mg/dL (ref 0.61–1.24)
GFR, Estimated: >60 mL/min (ref >60)
Glucose, Bld: 152 mg/dL — ABNORMAL HIGH (ref 70–99)
Potassium: 4.2 mmol/L (ref 3.5–5.1)
Sodium: 134 mmol/L — ABNORMAL LOW (ref 135–145)
Total Bilirubin: 1 mg/dL (ref 0.3–1.2)
Total Protein: 6.6 g/dL (ref 6.5–8.1)

## 2021-12-31 LAB — CBC
HCT: 40.8 % (ref 39.0–52.0)
Hemoglobin: 13.7 g/dL (ref 13.0–17.0)
MCH: 30.6 pg (ref 26.0–34.0)
MCHC: 33.6 g/dL (ref 30.0–36.0)
MCV: 91.1 fL (ref 80.0–100.0)
Platelets: 180 10*3/uL (ref 150–400)
RBC: 4.48 MIL/uL (ref 4.22–5.81)
RDW: 13.2 % (ref 11.5–15.5)
WBC: 22.9 10*3/uL — ABNORMAL HIGH (ref 4.0–10.5)
nRBC: 0 % (ref 0.0–0.2)

## 2021-12-31 LAB — HEMOGLOBIN A1C
Hgb A1c MFr Bld: 6.1 % — ABNORMAL HIGH (ref 4.8–5.6)
Mean Plasma Glucose: 128.37 mg/dL

## 2021-12-31 LAB — MRSA NEXT GEN BY PCR, NASAL: MRSA by PCR Next Gen: NOT DETECTED

## 2021-12-31 LAB — GLUCOSE, CAPILLARY
Glucose-Capillary: 157 mg/dL — ABNORMAL HIGH (ref 70–99)
Glucose-Capillary: 150 mg/dL — ABNORMAL HIGH (ref 70–99)
Glucose-Capillary: 151 mg/dL — ABNORMAL HIGH (ref 70–99)
Glucose-Capillary: 145 mg/dL — ABNORMAL HIGH (ref 70–99)

## 2021-12-31 LAB — LIPASE, BLOOD: Lipase: 75 U/L — ABNORMAL HIGH (ref 11–51)

## 2021-12-31 LAB — LACTIC ACID, PLASMA: Lactic Acid, Venous: 2.4 mmol/L (ref 0.5–1.9)

## 2021-12-31 MED ORDER — SODIUM CHLORIDE 0.9 % IV SOLN
INTRAVENOUS | Status: AC
Start: 2021-12-31 — End: 2022-01-01

## 2021-12-31 MED ORDER — GLUCERNA SHAKE PO LIQD
237.0000 mL | Freq: Three times a day (TID) | ORAL | Status: DC
  Administered 2021-12-31 – 2022-01-08 (×12): 237 mL via ORAL
  Filled 2021-12-31 (×25): qty 237

## 2021-12-31 NOTE — Evaluation (Signed)
Clinical/Bedside Swallow Evaluation Patient Details  Name: Jordan [name] MRN: [id number] Date of Birth: 12-Mar-1941  Today's Date: 12/31/2021 Time: SLP Start Time (ACUTE ONLY): 1655 SLP Stop Time (ACUTE ONLY): 1720 SLP Time Calculation (min) (ACUTE ONLY): 25 min  Past Medical History:  Past Medical History:  Diagnosis Date   Arthritis    R shoulder, bone spur   Arthritis    "hands" (03/25/2016)   Chronic diastolic heart failure (HCC)    Chronic lower back pain    Coronary heart disease    Dr Garnette Scheuermann   H/O cardiovascular stress test    perhaps last one was 2009   Heart murmur    12/29/11 echo: mild MR, no AS, trivial TR   History of gout    Hyperlipidemia    Hypertension    saw Dr. Mendel Ryder last earl;y- 2014, cardiac cath. last done ?2009, blocks seen didn't require any intervention at that point.   Narcolepsy    [name] 04-11-97; Mean Latency 1.41min, SOREM 2   OSA on CPAP    NPSG 12-13-98 AHI 22.7   PONV (postoperative nausea and vomiting)    Presence of permanent cardiac pacemaker    Shortness of breath    with exertion    Stroke (HCC)    Type II diabetes mellitus (HCC)    Past Surgical History:  Past Surgical History:  Procedure Laterality Date   CARDIAC CATHETERIZATION  2009?   EP IMPLANTABLE DEVICE N/A 03/25/2016   Procedure: Pacemaker Implant - Dual Chamber;  Surgeon: Marinus Maw, MD;  Location: [facility] INVASIVE CV LAB;  Service: Cardiovascular;  Laterality: N/A;   FRACTURE SURGERY     INSERT / REPLACE / REMOVE PACEMAKER  03/24/2016   IR KYPHO LUMBAR INC FX REDUCE BONE BX UNI/BIL CANNULATION INC/IMAGING  12/29/2021   IR RADIOLOGIST EVAL & MGMT  12/15/2021   LEFT AND RIGHT HEART CATHETERIZATION WITH CORONARY ANGIOGRAM N/A 06/05/2014   Procedure: LEFT AND RIGHT HEART CATHETERIZATION WITH CORONARY ANGIOGRAM;  Surgeon: Lesleigh Noe, MD;  Location: [facility] CATH LAB;  Service: Cardiovascular;  Laterality: N/A;   NASAL FRACTURE SURGERY     SHOULDER ARTHROSCOPY WITH ROTATOR CUFF  REPAIR AND SUBACROMIAL DECOMPRESSION Right 08/16/2013   Procedure: SHOULDER ARTHROSCOPY WITH ROTATOR CUFF REPAIR AND SUBACROMIAL DECOMPRESSION;  Surgeon: Mable Paris, MD;  Location: [facility] OR;  Service: Orthopedics;  Laterality: Right;  Right shoulder arthroscopy rotator cuff repair, subacromial decompression.   TONSILLECTOMY     HPI:  Per MD note, Pt is an 81 yo male adm to [facility] from 2/17 to 2/22 - suspected due to medications - Seroquel was started.  Pt underwent kyphoplasty on 2/28 went home started have stomach pain and dry heave.  PMH of hypertension ,noninsulin-dependent type 2 diabetes, CVA, Parkinson's, bradycardia-s/p PPM placement, OSA, narcolepsy.  He was brought to the ED by EMS due to abdominal pain - put on oxygen by EMS due to o2 sats at 90's.  Wife reported to Md that patient has sleep apnea.  Wife reported to MD that patient has intermittent chronic cough.  CT abdomen Lower chest: Small right and trace left pleural effusions new from 10/29/2021.  Per MD note, Sepsis present on admission with tachypnea, leukocytosis, lactic acidosis, fever, source of infection likely intra-abdominal  - initial CT concerning for Pancreatitis versus proximal sigmoid colon diverticulitis, with elevated lipase, normal LFT.  Swallow evaluation ordered per RN discussion with wife and concern for pt having dysphagia. RN and NT report pt has  not consumed po intake today due to his mentation.  Per prior brain imaging studies -Chronic infarct left frontal lobe anteriorly. Small  chronic infarcts in the cerebellum bilaterally. Chronic infarcts right and left thalamic.    Assessment / Plan / Recommendation  Clinical Impression  Patient currently presents with clinical indications of oropharyngeal dysphagia likely due to his Parkinson's disease and prior strokes with exacerbation due to his mentation. Pt was sleepy and required total cues to participate and accept po. [name]  He did not consistently follow directions  for oral motor exam and needed consistent stimulation to attempt to stay awake to accept intake.  Rigidity observed as pt attempted to hold his pudding cup. Very limited strength observed with pt inabilty to reach his mouth with his arm - pt admitting to more weakness than normal.   Oral holding due to decreased awareness/AMS and delayed swallow observed; suspect oral hold and premature spillage into pharynx.  Pt would open mouth to accept more intake not having swallowed. He benefited from verbal cues to "swallow" with feeder observing laryngeal elevation to assure swallow triggered. Pt only consumed approximately 2 ounces of pudding, 2 tsps of soup and approx one ounce of tea before SLP aborted meal due to pt's lethargy. Will follow up for dysphagia management, family education. Wife was not present during evaluation.  Posted swallow precaution sign and advised pt to recommendations. SLP Visit Diagnosis: Dysphagia, oral phase (R13.11);Dysphagia, unspecified (R13.10)    Aspiration Risk  Moderate aspiration risk;Risk for inadequate nutrition/hydration    Diet Recommendation Thin liquid;Nectar-thick liquid   Liquid Administration via: Spoon;No straw Medication Administration: Other (Comment) (with puree) Compensations: Slow rate;Small sips/bites;Other (Comment) (observe laryngeal elevation to assure pt swallows, cue to swallow PRn) Postural Changes: Remain upright for at least 30 minutes after po intake;Seated upright at 90 degrees    Other  Recommendations Oral Care Recommendations: Oral care BID    Recommendations for follow up therapy are one component of a multi-disciplinary discharge planning process, led by the attending physician.  Recommendations may be updated based on patient status, additional functional criteria and insurance authorization.  Follow up Recommendations Skilled nursing-short term rehab (<3 hours/day)      Assistance Recommended at Discharge Frequent or constant  Supervision/Assistance  Functional Status Assessment Patient has had a recent decline in their functional status and demonstrates the ability to make significant improvements in function in a reasonable and predictable amount of time.  Frequency and Duration min 1 x/week  1 week       Prognosis Prognosis for Safe Diet Advancement: Fair Barriers to Reach Goals: Severity of deficits;Time post onset      Swallow Study   General Date of Onset: 12/31/21 HPI: Per MD note, Pt is an 81 yo male adm to [facility] from 2/17 to 2/22 - suspected due to medications - Seroquel was started.  Pt underwent kyphoplasty on 2/28 went home started have stomach pain and dry heave.  PMH of hypertension ,noninsulin-dependent type 2 diabetes, CVA, Parkinson's, bradycardia-s/p PPM placement, OSA, narcolepsy.  He was brought to the ED by EMS due to abdominal pain - put on oxygen by EMS due to o2 sats at 90's.  Wife reported to Md that patient has sleep apnea.  Wife reported to MD that patient has intermittent chronic cough.  CT abdomen Lower chest: Small right and trace left pleural effusions new from 10/29/2021.  Per MD note, Sepsis present on admission with tachypnea, leukocytosis, lactic acidosis, fever, source of infection likely intra-abdominal  -  initial CT concerning for Pancreatitis versus proximal sigmoid colon diverticulitis, with elevated lipase, normal LFT.  Swallow evaluation ordered per RN discussion with wife and concern for pt having dysphagia. RN and NT report pt has not consumed po intake today due to his mentation.  Per prior brain imaging studies -Chronic infarct left frontal lobe anteriorly. Small  chronic infarcts in the cerebellum bilaterally. Chronic infarcts right and left thalamic. Type of Study: Bedside Swallow Evaluation Diet Prior to this Study: Thin liquids;Nectar-thick liquids Temperature Spikes Noted: No Respiratory Status: Nasal cannula History of Recent Intubation: No Behavior/Cognition:  Lethargic/Drowsy;Distractible;Requires cueing Oral Cavity Assessment: Within Functional Limits Oral Care Completed by SLP: No Oral Cavity - Dentition: Edentulous;Other (Comment);Missing dentition (few lower dentition) Self-Feeding Abilities: Total assist Patient Positioning: Upright in bed Baseline Vocal Quality: Low vocal intensity Volitional Cough: Cognitively unable to elicit Volitional Swallow: Unable to elicit    Oral/Motor/Sensory Function Overall Oral Motor/Sensory Function: Other (comment) (pt did not follow directions for oral motor exam, able to seal lips on spoon inconsistently, appears with facial/labial asymmetry)   Ice Chips Ice chips: Not tested   Thin Liquid Thin Liquid: Impaired Presentation: Cup;Straw;Spoon Oral Phase Impairments: Poor awareness of bolus;Reduced labial seal Oral Phase Functional Implications: Oral holding;Prolonged oral transit;Other (comment) Other Comments: Clinically pt presents with delayed swallow - due to mentation; suspect oral hold and premature spillage into pharynx, pt unable to adequately for suction and seal on straw- thus do not advise its use.    Nectar Thick Nectar Thick Liquid: Not tested   Honey Thick Honey Thick Liquid: Not tested   Puree Puree: Impaired Presentation: Spoon Oral Phase Impairments: Reduced labial seal Oral Phase Functional Implications: Prolonged oral transit Pharyngeal Phase Impairments: Suspected delayed Swallow   Solid     Solid: Not tested      Chales Abrahams 12/31/2021,8:18 PM   Rolena Infante, MS [facility] SLP Acute Rehab Services Office [phone] Pager [phone]

## 2021-12-31 NOTE — Progress Notes (Signed)
Attempted to call pt spouse, via telephone. No answer at this time. ?

## 2021-12-31 NOTE — Evaluation (Signed)
Physical Therapy Evaluation ?Patient Details ?Name: Jordan [name] ?MRN: [id number] ?DOB: Jun 29, 1941 ?Today's Date: 12/31/2021 ? ?History of Present Illness ? 81 y.o. male with medical history significant of hypertension ,noninsulin-dependent type 2 diabetes, CVA, Parkinson's, bradycardia-s/p PPM placement, OSA, narcolepsy, brought to ED by EMS due to due to abdominal pain, he was put on oxygen by EMS due to o2 sats at 33's, wife reports patient has sleep apnea.  He was hospitalized from 2/17 to 2/22 due to hallucination, Zoloft and Klonopin was discontinued, he was started on Seroquel which helped hallucination, he underwent kyphoplasty on 2/28 went home. Now readmitted with stomach pain and dry heave. Dx of sepsis, pancreatitis vs diverticulitis.  ?Clinical Impression ? Pt admitted with above diagnosis. +2 total assist for bed mobility. Pt sat at edge of bed for ~7 minutes. He is lethargic, has difficulty following commands. Did not attempt standing due to BLE weakness and lethargy.  Pt currently with functional limitations due to the deficits listed below (see PT Problem List). Pt will benefit from skilled PT to increase their independence and safety with mobility to allow discharge to the venue listed below.   ?   ?   ? ?Recommendations for follow up therapy are one component of a multi-disciplinary discharge planning process, led by the attending physician.  Recommendations may be updated based on patient status, additional functional criteria and insurance authorization. ? ?Follow Up Recommendations Skilled nursing-short term rehab (<3 hours/day) ? ?  ?Assistance Recommended at Discharge Frequent or constant Supervision/Assistance  ?Patient can return home with the following ? Assistance with cooking/housework;Assist for transportation;Help with stairs or ramp for entrance;Direct supervision/assist for medications management;Direct supervision/assist for financial management;Two people to help with walking and/or  transfers;Two people to help with bathing/dressing/bathroom ? ?  ?Equipment Recommendations Hospital bed;Wheelchair (measurements PT);Wheelchair cushion (measurements PT)  ?Recommendations for Other Services ?    ?  ?Functional Status Assessment Patient has had a recent decline in their functional status and demonstrates the ability to make significant improvements in function in a reasonable and predictable amount of time.  ? ?  ?Precautions / Restrictions Precautions ?Precautions: Fall ?Precaution Comments: Reports multiple falls at home, some he can get up from with wife assist, some they have had to call EMS for additional support (Pt not oriented, no family present. This info is per recent OT note 12/23/21) ?Restrictions ?Weight Bearing Restrictions: No  ? ?  ? ?Mobility ? Bed Mobility ?Overal bed mobility: Needs Assistance ?Bed Mobility: Sit to Supine, Supine to Sit ?  ?  ?Supine to sit: Max assist ?Sit to supine: Total assist, +2 for physical assistance ?  ?General bed mobility comments: assist to raise trunk and pivot hips to EOB; then +2 total assist to control trunk descent and bring BLEs into bed; poor then fair sitting balance; pt sat at EOB for ~7 minutes. did not attempt standing due to pt being lethargic and BLE weakness ?  ? ?Transfers ?  ?  ?  ?  ?  ?  ?  ?  ?  ?  ?  ? ?Ambulation/Gait ?  ?  ?  ?  ?  ?  ?  ?  ? ?Stairs ?  ?  ?  ?  ?  ? ?Wheelchair Mobility ?  ? ?Modified Rankin (Stroke Patients Only) ?  ? ?  ? ?Balance Overall balance assessment: Needs assistance, History of Falls ?Sitting-balance support: Feet supported ?Sitting balance-Leahy Scale: Poor ?Sitting balance - Comments: initially required min/mod A  for sitting balance, then fair ?  ?  ?  ?  ?  ?  ?  ?  ?  ?  ?  ?  ?  ?  ?  ?   ? ? ? ?Pertinent Vitals/Pain Pain Assessment ?Breathing: normal ?Negative Vocalization: none ?Facial Expression: smiling or inexpressive ?Body Language: relaxed ?Consolability: no need to console ?PAINAD Score: 0   ? ? ?Home Living Family/patient expects to be discharged to:: Private residence ?Living Arrangements: Spouse/significant other;Children ?Available Help at Discharge: Family ?Type of Home: Apartment ?Home Access: Elevator ?  ?  ?  ?Home Layout: One level ?Home Equipment: Other (comment);Shower Land (2 wheels) ?Additional Comments: above info from recent OT note 12/21/21 (no family present during this PT eval and pt unable to provide home info due to confusion)  ?  ?Prior Function Prior Level of Function : Needs assist ?  ?  ?  ?Physical Assist : Mobility (physical);ADLs (physical) ?Mobility (physical): Transfers;Gait ?ADLs (physical): Bathing;Dressing;Toileting;IADLs ?Mobility Comments: Wife has had to provide more physical assist here lately with mobility; uses SPC (per OT note 12/21/21) ?ADLs Comments: Wife providing assist ?  ? ? ?Hand Dominance  ? Dominant Hand: Right ? ?  ?Extremity/Trunk Assessment  ? Upper Extremity Assessment ?Upper Extremity Assessment: Defer to OT evaluation ?  ? ?Lower Extremity Assessment ?Lower Extremity Assessment: Generalized weakness (B knee ext ~+3/5 (assessment limited by ability to follow commands)) ?  ? ?Cervical / Trunk Assessment ?Cervical / Trunk Assessment: Kyphotic  ?Communication  ? Communication: Expressive difficulties  ?Cognition Arousal/Alertness: Lethargic ?Behavior During Therapy: Flat affect ?Overall Cognitive Status: No family/caregiver present to determine baseline cognitive functioning ?  ?  ?  ?  ?  ?  ?  ?  ?  ?  ?  ?  ?  ?  ?  ?  ?General Comments: pt oriented to self only, he is awake but groggy, requires frequent cues to open eyes,  follows 1 step commands inconsistently. Speech is garbled, difficult to understand. ?  ?  ? ?  ?General Comments   ? ?  ?Exercises    ? ?Assessment/Plan  ?  ?PT Assessment Patient needs continued PT services  ?PT Problem List Decreased strength;Decreased activity tolerance;Decreased mobility;Decreased  balance;Decreased cognition;Decreased knowledge of use of DME;Decreased safety awareness;Decreased knowledge of precautions;Pain ? ?   ?  ?PT Treatment Interventions DME instruction;Gait training;Functional mobility training;Therapeutic activities;Therapeutic exercise;Balance training;Patient/family education;Cognitive remediation   ? ?PT Goals (Current goals can be found in the Care Plan section)  ?Acute Rehab PT Goals ?PT Goal Formulation: Patient unable to participate in goal setting ?Time For Goal Achievement: 01/14/22 ?Potential to Achieve Goals: Fair ? ?  ?Frequency Min 2X/week ?  ? ? ?Co-evaluation   ?  ?  ?  ?  ? ? ?  ?AM-PAC PT "6 Clicks" Mobility  ?Outcome Measure Help needed turning from your back to your side while in a flat bed without using bedrails?: A Lot ?Help needed moving from lying on your back to sitting on the side of a flat bed without using bedrails?: A Lot ?Help needed moving to and from a bed to a chair (including a wheelchair)?: Total ?Help needed standing up from a chair using your arms (e.g., wheelchair or bedside chair)?: Total ?Help needed to walk in hospital room?: Total ?Help needed climbing 3-5 steps with a railing? : Total ?6 Click Score: 8 ? ?  ?End of Session   ?Activity Tolerance: Patient tolerated treatment well ?Patient left:  in bed;with bed alarm set ?Nurse Communication: Mobility status ?PT Visit Diagnosis: Other abnormalities of gait and mobility (R26.89);History of falling (Z91.81);Difficulty in walking, not elsewhere classified (R26.2) ?  ? ?Time: 6184-8592 ?PT Time Calculation (min) (ACUTE ONLY): 12 min ? ? ?Charges:   PT Evaluation ?$PT Eval Moderate Complexity: 1 Mod ?  ?  ?   ? ? ?Blondell Reveal Kistler PT 12/31/2021  ?Acute Rehabilitation Services ?Pager [phone] ?Office [phone] ? ? ?

## 2021-12-31 NOTE — Progress Notes (Signed)
?PROGRESS NOTE ? ? ? ?[name]  YJE:[id number] DOB: 12/02/1940 DOA: 12/30/2021 ?PCP: [name], MD  ? ? ? ?Brief Narrative:  ? ?Jordan [name] is a 81 y.o. male with medical history significant of hypertension ,noninsulin-dependent type 2 diabetes, CVA, Parkinson's, bradycardia-s/p PPM placement, OSA, narcolepsy, brought to ED by EMS due to due to abdominal pain ? ?Subjective: ? ?Spike fever 101.4 yesterday evening ?Lipase coming down, is not appear in pain today, tolerating full liquid diet ?Tremors appear has resolved ?He states " I do not have pain today, but I feel weak" ?He appears still confused, he is only oriented to person ? ?Assessment & Plan: ? Principal Problem: ?  Sepsis ([place]) ?Active Problems: ?  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  Presence of permanent cardiac pacemaker ?  Non-insulin dependent type 2 diabetes mellitus ([place]) ?  Parkinson's disease ([place]) ? ? ? ?Assessment and Plan: ? ? ?Sepsis present on admission with tachypnea, leukocytosis, lactic acidosis, fever, source of infection likely intra-abdominal ?- initial CT is concerning for Pancreatitis versus proximal sigmoid colon diverticulitis, he does has elevated lipase, normal LFT ?-Blood culture obtained in the ED in process, no growth so far ?-COVID screening negative ?-urine culture was ordered but not collected ?- MRSA screen was ordered but not collected ?-He received vancomycin, cefepime, Flagyl in the ED,  ?-Lipase trending down ,continue Rocephin Flagyl for now, continue hydration, continue full liquid diet, repeat lipase in the morning, advance diet as tolerated ?Follow-up on cultures ?  ? acute metabolic encephalopathy ?Wife report patient had agitation episode during last hospitalization ?Wife does not want patient to get prn Haldol or put on restraint  unless she gave permission ?Delirium precaution ?Remain confused today, currently on agitation ?  ?Non-insulin-dependent type 2 diabetes ?Hold metformin ?Keep on SSI ?   ?Hypertension ?Continue metoprolol, hold lisinopril in the setting of elevation of lipase ?Add as needed hydralazine ?  ?CVA/Parkinson's ?Tend to lean towards right side which is chronic per wife ?Continue home medication ?  ?OSA continue CPAP ?  ?Hepatic lesions appear to be mostly stable except a new low density lobular lesion ?Detail please see CT ab/pel obtained on admission ?  ?Bilateral nonobstructing renal stones ?Renal function unremarkable ?  ?L2 kyphoplasty On 2/28 ?  ?Subacute or older inferior left pubic ramus nondisplaced fracture ?Physical therapy ?  ?FTT: since 06/2021 when he was hospitalized for left acetabular fracture. ? will get PT eval, wife desires CRR placement if meeting criteria ? ? ?Obesity Body mass index is 31.68 kg/m?Marland KitchenMarland Kitchen ?OSA on cpap ? ? ? ?I have Reviewed nursing notes, Vitals, pain scores, I/o's, Lab results and  imaging results since pt's last encounter, details please see discussion above  ?I ordered the following labs:  ?Unresulted Labs (From admission, onward)  ? ?  Start     Ordered  ? 01/06/22 0500  Creatinine, serum  (enoxaparin (LOVENOX)    CrCl >/= 30 ml/min)  Weekly,   R     ?Comments: while on enoxaparin therapy ?  ? 12/30/21 1956  ? 01/01/22 0500  CBC with Differential/Platelet  Tomorrow morning,   R       ? 12/31/21 1536  ? 01/01/22 6378  Basic metabolic panel  Tomorrow morning,   R       ? 12/31/21 1536  ? 01/01/22 0500  Magnesium  Tomorrow morning,   STAT       ? 12/31/21 1536  ? 01/01/22 0500  Lipase, blood  Tomorrow  morning,   R       ? 12/31/21 1536  ? 01/01/22 0500  Lactic acid, plasma  Tomorrow morning,   R       ? 12/31/21 1536  ? 12/30/21 1530  MRSA Next Gen by PCR, Nasal  Once,   R       ? 12/30/21 1529  ? 12/30/21 1530  Urine Culture  Once,   R       ?Question:  Indication  Answer:  Sepsis  ? 12/30/21 1530  ? ?  ?  ? ?  ? ? ? ?DVT prophylaxis: enoxaparin (LOVENOX) injection 40 mg Start: 12/30/21 2200 ? ? ?Code Status:   Code Status: Full Code ? ?Family  Communication: Called wife, no answer, will try again later ?Disposition:  ? ?Status is: Inpatient ? ?Dispo: The patient is from: Home ?             Anticipated d/c is to: TBD ?             Anticipated d/c date is: TBD ? ?Antimicrobials:   ? ?Anti-infectives (From admission, onward)  ? ? Start     Dose/Rate Route Frequency Ordered Stop  ? 12/30/21 2200  metroNIDAZOLE (FLAGYL) IVPB 500 mg       ? 500 mg ?100 mL/hr over 60 Minutes Intravenous Every 12 hours 12/30/21 1755    ? 12/30/21 1845  cefTRIAXone (ROCEPHIN) 2 g in sodium chloride 0.9 % 100 mL IVPB       ? 2 g ?200 mL/hr over 30 Minutes Intravenous Every 24 hours 12/30/21 1755    ? 12/30/21 1245  ceFEPIme (MAXIPIME) 2 g in sodium chloride 0.9 % 100 mL IVPB       ? 2 g ?200 mL/hr over 30 Minutes Intravenous  Once 12/30/21 1234 12/30/21 1342  ? 12/30/21 1245  metroNIDAZOLE (FLAGYL) IVPB 500 mg       ? 500 mg ?100 mL/hr over 60 Minutes Intravenous  Once 12/30/21 1234 12/30/21 1417  ? 12/30/21 1245  vancomycin (VANCOCIN) IVPB 1000 mg/200 mL premix       ? 1,000 mg ?200 mL/hr over 60 Minutes Intravenous  Once 12/30/21 1234 12/30/21 1417  ? ?  ? ? ? ? ? ?Objective: ?Vitals:  ? 12/31/21 0452 12/31/21 1007 12/31/21 1008 12/31/21 1412  ?BP: (!) 141/78 137/76  125/81  ?Pulse: 65 73  73  ?Resp: (!) 24   (!) 22  ?Temp: 98.6 ?F (37 ?C)   99.6 ?F (37.6 ?C)  ?TempSrc: Oral   Oral  ?SpO2: 90% (!) 87% 93% 90%  ?Weight:      ?Height:      ? ? ?Intake/Output Summary (Last 24 hours) at 12/31/2021 1537 ?Last data filed at 12/31/2021 1500 ?Gross per 24 hour  ?Intake 2807.12 ml  ?Output 100 ml  ?Net 2707.12 ml  ? ?Filed Weights  ? 12/30/21 1916  ?Weight: 94.5 kg  ? ? ?Examination: ? ?General exam: Frail, only oriented to person, calm and cooperative, currently no agitation ?Respiratory system: Clear to auscultation. Respiratory effort normal. ?Cardiovascular system:  RRR.  ?Gastrointestinal system: Abdomen is nondistended, soft and nontender.  Normal bowel sounds heard. ?Central nervous  system: Only oriented to person. No focal neurological deficits. ?Extremities:  no edema ?Skin: No rashes, lesions or ulcers ?Psychiatry: Calm, currently no agitation ? ? ?Data Reviewed: I have personally reviewed  labs and visualized  imaging studies since the last encounter and formulate the plan  ? ? ? ? ? ? ?  Scheduled Meds: ? acetaminophen  1,000 mg Oral BID  ? atorvastatin  20 mg Oral Daily  ? carbidopa-levodopa  1 tablet Oral TID  ? Carbidopa-Levodopa ER  1 tablet Oral QHS  ? cholecalciferol  1,000 Units Oral Daily  ? enoxaparin (LOVENOX) injection  40 mg Subcutaneous Q24H  ? insulin aspart  0-15 Units Subcutaneous TID WC  ? melatonin  10 mg Oral QHS  ? metoprolol tartrate  25 mg Oral BID  ? multivitamin with minerals  1 tablet Oral Daily  ? omega-3 acid ethyl esters  1 g Oral Daily  ? polyethylene glycol  17 g Oral Daily  ? QUEtiapine  12.5 mg Oral QHS  ? senna-docusate  1 tablet Oral BID  ? ?Continuous Infusions: ? cefTRIAXone (ROCEPHIN)  IV Stopped (12/30/21 2107)  ? lactated ringers 125 mL/hr at 12/31/21 0640  ? metronidazole 500 mg (12/31/21 0931)  ? ? ? LOS: 1 day  ? ? ?Florencia Reasons, MD PhD FACP ?[facility] ? ?Available via Epic secure chat 7am-7pm for nonurgent issues ?Please page for urgent issues ?To page the attending provider between 7A-7P or the covering provider during after hours 7P-7A, please log into the web site [email or web address] and access using universal [place] password for that web site. If you do not have the password, please call the hospital operator. ? ? ? ?12/31/2021, 3:37 PM  ? ? ?

## 2021-12-31 NOTE — Plan of Care (Signed)

## 2022-01-01 ENCOUNTER — Ambulatory Visit (HOSPITAL_COMMUNITY): Payer: Medicare (Managed Care)

## 2022-01-01 DIAGNOSIS — G4733 Obstructive sleep apnea (adult) (pediatric): Secondary | ICD-10-CM | POA: Diagnosis not present

## 2022-01-01 DIAGNOSIS — E119 Type 2 diabetes mellitus without complications: Secondary | ICD-10-CM | POA: Diagnosis not present

## 2022-01-01 DIAGNOSIS — Z950 Presence of cardiac pacemaker: Secondary | ICD-10-CM | POA: Diagnosis not present

## 2022-01-01 DIAGNOSIS — G20 Parkinson's disease: Secondary | ICD-10-CM | POA: Diagnosis not present

## 2022-01-01 LAB — CBC WITH DIFFERENTIAL/PLATELET
Abs Immature Granulocytes: 0.14 10*3/uL — ABNORMAL HIGH (ref 0.00–0.07)
Basophils Absolute: 0.1 10*3/uL (ref 0.0–0.1)
Basophils Relative: 0 %
Eosinophils Absolute: 0.1 10*3/uL (ref 0.0–0.5)
Eosinophils Relative: 1 %
HCT: 35.6 % — ABNORMAL LOW (ref 39.0–52.0)
Hemoglobin: 12 g/dL — ABNORMAL LOW (ref 13.0–17.0)
Immature Granulocytes: 1 %
Lymphocytes Relative: 9 %
Lymphs Abs: 1.6 10*3/uL (ref 0.7–4.0)
MCH: 30.2 pg (ref 26.0–34.0)
MCHC: 33.7 g/dL (ref 30.0–36.0)
MCV: 89.7 fL (ref 80.0–100.0)
Monocytes Absolute: 1.2 10*3/uL — ABNORMAL HIGH (ref 0.1–1.0)
Monocytes Relative: 6 %
Neutro Abs: 16 10*3/uL — ABNORMAL HIGH (ref 1.7–7.7)
Neutrophils Relative %: 83 %
Platelets: 189 10*3/uL (ref 150–400)
RBC: 3.97 MIL/uL — ABNORMAL LOW (ref 4.22–5.81)
RDW: 13.2 % (ref 11.5–15.5)
WBC: 19.1 10*3/uL — ABNORMAL HIGH (ref 4.0–10.5)
nRBC: 0 % (ref 0.0–0.2)

## 2022-01-01 LAB — BASIC METABOLIC PANEL
Anion gap: 9 (ref 5–15)
BUN: 20 mg/dL (ref 8–23)
CO2: 26 mmol/L (ref 22–32)
Calcium: 8.4 mg/dL — ABNORMAL LOW (ref 8.9–10.3)
Chloride: 99 mmol/L (ref 98–111)
Creatinine, Ser: 0.82 mg/dL (ref 0.61–1.24)
GFR, Estimated: >60 mL/min (ref >60)
Glucose, Bld: 145 mg/dL — ABNORMAL HIGH (ref 70–99)
Potassium: 3.6 mmol/L (ref 3.5–5.1)
Sodium: 134 mmol/L — ABNORMAL LOW (ref 135–145)

## 2022-01-01 LAB — GLUCOSE, CAPILLARY
Glucose-Capillary: 153 mg/dL — ABNORMAL HIGH (ref 70–99)
Glucose-Capillary: 132 mg/dL — ABNORMAL HIGH (ref 70–99)
Glucose-Capillary: 135 mg/dL — ABNORMAL HIGH (ref 70–99)
Glucose-Capillary: 125 mg/dL — ABNORMAL HIGH (ref 70–99)

## 2022-01-01 LAB — LACTIC ACID, PLASMA: Lactic Acid, Venous: 1 mmol/L (ref 0.5–1.9)

## 2022-01-01 LAB — LIPASE, BLOOD: Lipase: 35 U/L (ref 11–51)

## 2022-01-01 LAB — MAGNESIUM: Magnesium: 1.4 mg/dL — ABNORMAL LOW (ref 1.7–2.4)

## 2022-01-01 MED ORDER — MAGNESIUM SULFATE 2 GM/50ML IV SOLN
2.0000 g | Freq: Once | INTRAVENOUS | Status: AC
  Administered 2022-01-01: 2 g via INTRAVENOUS
  Filled 2022-01-01: qty 50

## 2022-01-01 NOTE — Progress Notes (Signed)
[facility]) Hospital Liaison note: ? ?Notified by [facility] of request for [facility] Palliative Care services. Will continue to follow for disposition. ? ?Please call with any outpatient palliative questions or concerns. ? ?Thank you for the opportunity to participate in this patient's care. ? ?Thank you, ?Lorelee Market, LPN ?[facility] Hospital Liaison ?[phone] ?

## 2022-01-01 NOTE — TOC Initial Note (Addendum)
Transition of Care (TOC) - Initial/Assessment Note  ? ? ?Patient Details  ?Name: Jordan [name] ?MRN: [id number] ?Date of Birth: [birth year] ? ?Transition of Care (TOC) CM/SW Contact:    ?Trish Mage, LCSW ?Phone Number: ?01/01/2022, 12:23 PM ? ?Clinical Narrative:   Patient seen in follow up to PT recommendation of short term rehab.  [name] was asleep during my time in the room, I spoke with his wife who was bedside.  Although she is reluctant to send him to SNF, she admits he is currently too much for her to handle alone.  She has no help. Has all needed DME and was to start working with [facility] Cortnie Ringel for [facility] services, but they were unable to link up before he required readmission to the hospital.   She agreed to bed search, is aware from previous experience that many facilities are not in network with [employer], and is clear that she expects to get a 3 star or better offer to feel good about him going to a facility. Bed search initiated. TOC will continue to follow during the course of hospitalization. ? ?Addendum: Spoke with Freda Munro at [employer] [employer].  She sai they sometimes are able to get good rates on [employer] patients even though they are out of network.  She will check and let us know via [place]. Co-pay would then be presented to wife and she would have to make the call. ?            ? ? ?Expected Discharge Plan: [place] ?Barriers to Discharge: SNF Pending bed offer ? ? ?Patient Goals and CMS Choice ?Patient states their goals for this hospitalization and ongoing recovery are:: somnolent ?CMS Medicare.gov Compare Post Acute Care list provided to:: Patient Represenative (must comment) ?Choice offered to / list presented to : Spouse ? ?Expected Discharge Plan and Services ?Expected Discharge Plan: [place] ?  ?Discharge Planning Services: CM Consult ?Post Acute Care Choice: [place] ?Living arrangements for the past 2 months: Apartment ?                ?  ?  ?  ?  ?  ?  ?  ?   ?  ?  ? ?Prior Living Arrangements/Services ?Living arrangements for the past 2 months: Apartment ?Lives with:: Spouse ?Patient language and need for interpreter reviewed:: Yes ?       ?Need for Family Participation in Patient Care: Yes (Comment) ?Care giver support system in place?: Yes (comment) ?Current home services: DME, Home PT ?Criminal Activity/Legal Involvement Pertinent to Current Situation/Hospitalization: No - Comment as needed ? ?Activities of Daily Living ?Home Assistive Devices/Equipment: Wellsite geologist, Wheelchair ?ADL Screening (condition at time of admission) ?Patient's cognitive ability adequate to safely complete daily activities?: No ?Is the patient deaf or have difficulty hearing?: No ?Does the patient have difficulty seeing, even when wearing glasses/contacts?: No ?Does the patient have difficulty concentrating, remembering, or making decisions?: Yes ?Patient able to express need for assistance with ADLs?: No ?Does the patient have difficulty dressing or bathing?: Yes ?Independently performs ADLs?: No ?Communication: Needs assistance ?Is this a change from baseline?: Pre-admission baseline ?Dressing (OT): Needs assistance ?Is this a change from baseline?: Pre-admission baseline ?Grooming: Needs assistance ?Is this a change from baseline?: Pre-admission baseline ?Feeding: Needs assistance ?Is this a change from baseline?: Pre-admission baseline ?Bathing: Needs assistance ?Is this a change from baseline?: Pre-admission baseline ?Toileting: Needs assistance ?Is this a change from baseline?: Pre-admission baseline ?In/Out Bed: Dependent ?  Is this a change from baseline?: Pre-admission baseline ?Walks in Home: Dependent ?Is this a change from baseline?: Pre-admission baseline ?Does the patient have difficulty walking or climbing stairs?: Yes ?Weakness of Legs: Both ?Weakness of Arms/Hands: Both ? ?Permission Sought/Granted ?Permission sought to share information with : Family Supports ?Permission  granted to share information with : No ? Share Information with NAME: Jadd Gasior, wife, [phone] ?   ?   ?   ? ?Emotional Assessment ?Appearance:: Appears stated age ?Attitude/Demeanor/Rapport: Unable to Assess ?Affect (typically observed): Unable to Assess ?  ?Alcohol / Substance Use: Not Applicable ?Psych Involvement: No (comment) ? ?Admission diagnosis:  Generalized abdominal pain [R10.84] ?Sepsis ([place]) [A41.9] ?Patient Active Problem List  ? Diagnosis Date Noted  ? Sepsis ([place]) 12/30/2021  ? Parkinson's disease ([place]) 12/21/2021  ? Parkinson disease ([place]) 12/18/2021  ? Frequent falls 12/18/2021  ? History of CVA (cerebrovascular accident) 12/18/2021  ? 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 Hallucinations, visual 07/30/2021  ? Acetabulum fracture, left ([place]) 06/25/2021  ? Acute hip pain, left 06/24/2021  ? Fall at home, initial encounter 06/24/2021  ? Leukocytosis 06/24/2021  ? Chronic diastolic heart failure ([place])   ? Sinusitis, acute maxillary 06/24/2020  ? AKI (acute kidney injury) ([place])   ? Labile blood glucose   ? Cough   ? Benign essential HTN   ? Skin lesion of hand   ? Hypoalbuminemia due to protein-calorie malnutrition ([place])   ? Controlled type 2 diabetes mellitus with hyperglycemia, without long-term current use of insulin ([place])   ? 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 Morbid obesity ([place])   ? Acute blood loss anemia   ? Acute thalamic infarction ([place]) 04/15/2020  ? 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 TIA (transient ischemic attack) 02/17/2018  ? Non-insulin dependent type 2 diabetes mellitus ([place]) 02/17/2018  ? Insomnia 02/01/2018  ? 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 Chronotropic incompetence 07/09/2014  ? Dyspnea on exertion 03/03/2014  ? HLD (hyperlipidemia) 12/13/2007  ? Essential hypertension 12/13/2007  ? OBESITY 12/05/2007  ? 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 Coronary atherosclerosis  12/05/2007  ? ?PCP:  [name], MD ?Pharmacy:   ?[id number] - [place], [place] - [address] ?[place] ?[place] [address] ?Phone: [phone] Fax: [phone] ? ?[place], [place] ?[address] ?[place] [address] ?Phone: [phone] Fax: [phone] ? ?[place], [place] [address] ?[place] [address] ?[address] ?[place] [address] ?Phone: [phone] Fax: [phone] ? ?[facility] ?[address] ?[place] [address] ?Phone: [phone] Fax: [phone] ? ? ? ? ?Social Determinants of Health (SDOH) Interventions ?  ? ?Readmission Risk Interventions ?No flowsheet data found. ? ? ?

## 2022-01-01 NOTE — Progress Notes (Signed)
?PROGRESS NOTE ? ? ? ?Jordan [name]  WUJ:[id number] DOB: 1940-12-02 DOA: 12/30/2021 ?PCP: [name], MD  ? ? ? ?Brief Narrative:  ? ?Jordan [name] is a 81 y.o. male with medical history significant of hypertension ,noninsulin-dependent type 2 diabetes, CVA, Parkinson's, bradycardia-s/p PPM placement, OSA, narcolepsy, brought to ED by EMS due to due to abdominal pain ? ?Subjective: ? ?Tmax 100 ?Wbc still elevated but start to trend down ?Lactic acid and lipase has normalized ?Denies pain, no tremor observed ? ?Assessment & Plan: ? Principal Problem: ?  Sepsis ([place]) ?Active Problems: ?  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  Presence of permanent cardiac pacemaker ?  Non-insulin dependent type 2 diabetes mellitus ([place]) ?  Parkinson's disease ([place]) ? ? ? ?Assessment and Plan: ? ? ?Sepsis present on admission with tachypnea, leukocytosis, lactic acidosis, fever, source of infection likely intra-abdominal ?- initial CT is concerning for Pancreatitis versus proximal sigmoid colon diverticulitis, he does has elevated lipase, normal LFT ?-Blood culture obtained in the ED in process, no growth so far ?-COVID screening negative ?-urine culture obtained after abx, in process ?- MRSA screen negative  ?-He received vancomycin, cefepime, Flagyl in the ED,  ?-continue Rocephin Flagyl to cover intrabdominal source  ?- continue hydration for another 24hrs, ? ?pancreatitis ?-lipase normalized, he reports ab pain resolved, advance diet to carb modified /low fat diet ?  ? acute metabolic encephalopathy ?Wife report patient had agitation episode during last hospitalization ?Wife does not want patient to get prn Haldol or put on restraint  unless she gave permission ?Delirium precaution, wife staying overnight  ?on agitation, appear improving ?  ?Non-insulin-dependent type 2 diabetes ?Hold metformin ?Keep on SSI ?  ?Hypertension ?Continue metoprolol, hold lisinopril in the setting of elevation of lipase ?Add as needed hydralazine ?   ?CVA/Parkinson's ?Tend to lean towards right side which is chronic per wife ?Continue home medication ?  ?OSA continue CPAP ?  ?Hepatic lesions appear to be mostly stable except a new low density lobular lesion ?Detail please see CT ab/pel obtained on admission ?  ?Bilateral nonobstructing renal stones ?Renal function unremarkable ?Urine , no sediment, but is tea color, monitor  ?  ?L2 kyphoplasty On 2/28 ?  ?Subacute or older inferior left pubic ramus nondisplaced fracture ?Physical therapy ?  ?FTT: frequent falls , 7-10 soft falls per wife, ( wife called EMS for fall assist) ,  ?Several hospitalizations since 06/2021 when he was hospitalized for left acetabular fracture. ? Pt eval ? ? ? ?Obesity Body mass index is 31.68 kg/m?Marland KitchenMarland Kitchen ?OSA on cpap ? ? ? ?I have Reviewed nursing notes, Vitals, pain scores, I/o's, Lab results and  imaging results since pt's last encounter, details please see discussion above  ?I ordered the following labs:  ?Unresulted Labs (From admission, onward)  ? ?  Start     Ordered  ? 01/06/22 0500  Creatinine, serum  (enoxaparin (LOVENOX)    CrCl >/= 30 ml/min)  Weekly,   R     ?Comments: while on enoxaparin therapy ?  ? 12/30/21 1956  ? 01/02/22 0500  CBC with Differential/Platelet  Tomorrow morning,   R       ? 01/01/22 0738  ? 01/02/22 9562  Basic metabolic panel  Tomorrow morning,   R       ? 01/01/22 0738  ? 01/02/22 0500  Magnesium  Tomorrow morning,   STAT       ? 01/01/22 0738  ? 01/01/22 0500  CBC with Differential/Platelet  Tomorrow morning,   R       ? 12/31/21 1536  ? 12/31/21 1816  Urine Culture  Once,   R       ? 12/31/21 1815  ? ?  ?  ? ?  ? ? ? ?DVT prophylaxis: enoxaparin (LOVENOX) injection 40 mg Start: 12/30/21 2200 ? ? ?Code Status:   Code Status: Full Code ? ?Family Communication: wife at bedside  ?Disposition:  ? ?Status is: Inpatient ? ?Dispo: The patient is from: Home ?             Anticipated d/c is to: TBD ?             Anticipated d/c date is: TBD ? ?Antimicrobials:    ? ?Anti-infectives (From admission, onward)  ? ? Start     Dose/Rate Route Frequency Ordered Stop  ? 12/30/21 2200  metroNIDAZOLE (FLAGYL) IVPB 500 mg       ? 500 mg ?100 mL/hr over 60 Minutes Intravenous Every 12 hours 12/30/21 1755    ? 12/30/21 1845  cefTRIAXone (ROCEPHIN) 2 g in sodium chloride 0.9 % 100 mL IVPB       ? 2 g ?200 mL/hr over 30 Minutes Intravenous Every 24 hours 12/30/21 1755    ? 12/30/21 1245  ceFEPIme (MAXIPIME) 2 g in sodium chloride 0.9 % 100 mL IVPB       ? 2 g ?200 mL/hr over 30 Minutes Intravenous  Once 12/30/21 1234 12/30/21 1342  ? 12/30/21 1245  metroNIDAZOLE (FLAGYL) IVPB 500 mg       ? 500 mg ?100 mL/hr over 60 Minutes Intravenous  Once 12/30/21 1234 12/30/21 1417  ? 12/30/21 1245  vancomycin (VANCOCIN) IVPB 1000 mg/200 mL premix       ? 1,000 mg ?200 mL/hr over 60 Minutes Intravenous  Once 12/30/21 1234 12/30/21 1417  ? ?  ? ? ? ? ? ?Objective: ?Vitals:  ? 12/31/21 1008 12/31/21 1412 12/31/21 2048 01/01/22 0558  ?BP:  125/81 139/72 (!) 142/76  ?Pulse:  73 69 66  ?Resp:  (!) 22 20 20   ?Temp:  99.6 ?F (37.6 ?C) 99.8 ?F (37.7 ?C) 100 ?F (37.8 ?C)  ?TempSrc:  Oral Oral Oral  ?SpO2: 93% 90% 90% 90%  ?Weight:      ?Height:      ? ? ?Intake/Output Summary (Last 24 hours) at 01/01/2022 1216 ?Last data filed at 01/01/2022 0600 ?Gross per 24 hour  ?Intake 1478.3 ml  ?Output 900 ml  ?Net 578.3 ml  ? ?Filed Weights  ? 12/30/21 1916  ?Weight: 94.5 kg  ? ? ?Examination: ? ?General exam: Frail, only oriented to person, calm and cooperative, currently no agitation ?Respiratory system: Clear to auscultation. Respiratory effort normal. ?Cardiovascular system:  RRR.  ?Gastrointestinal system: Abdomen is nondistended, soft and nontender.  Normal bowel sounds heard. ?Central nervous system: Only oriented to person. No focal neurological deficits. ?Extremities:  no edema ?Skin: No rashes, lesions or ulcers ?Psychiatry: Calm, currently no agitation ? ? ?Data Reviewed: I have personally reviewed  labs and  visualized  imaging studies since the last encounter and formulate the plan  ? ? ? ? ? ? ?Scheduled Meds: ? acetaminophen  1,000 mg Oral BID  ? atorvastatin  20 mg Oral Daily  ? carbidopa-levodopa  1 tablet Oral TID  ? Carbidopa-Levodopa ER  1 tablet Oral QHS  ? cholecalciferol  1,000 Units Oral Daily  ? enoxaparin (LOVENOX) injection  40 mg Subcutaneous Q24H  ? feeding  supplement (GLUCERNA SHAKE)  237 mL Oral TID BM  ? insulin aspart  0-15 Units Subcutaneous TID WC  ? melatonin  10 mg Oral QHS  ? metoprolol tartrate  25 mg Oral BID  ? multivitamin with minerals  1 tablet Oral Daily  ? omega-3 acid ethyl esters  1 g Oral Daily  ? polyethylene glycol  17 g Oral Daily  ? QUEtiapine  12.5 mg Oral QHS  ? senna-docusate  1 tablet Oral BID  ? ?Continuous Infusions: ? sodium chloride 75 mL/hr at 12/31/21 2330  ? cefTRIAXone (ROCEPHIN)  IV 2 g (12/31/21 1822)  ? metronidazole 500 mg (12/31/21 2216)  ? ? ? LOS: 2 days  ? ? ?Florencia Reasons, MD PhD FACP ?[facility] ? ?Available via Epic secure chat 7am-7pm for nonurgent issues ?Please page for urgent issues ?To page the attending provider between 7A-7P or the covering provider during after hours 7P-7A, please log into the web site [email or web address] and access using universal [place] password for that web site. If you do not have the password, please call the hospital operator. ? ? ? ?01/01/2022, 12:16 PM  ? ? ?

## 2022-01-01 NOTE — [place] FL2 (Signed)
?[place] MEDICAID FL2 LEVEL OF CARE SCREENING TOOL  ?  ? ?IDENTIFICATION  ?Patient Name: ?Jordan [name] Birthdate: Mar 31, 1941 Sex: male Admission Date (Current Location): ?12/30/2021  ?[place] and [place] Number: ? [place] ?  Facility and Address:  ?[facility],  [address] [place], [place] ?     Provider Number: ?[id number]  ?Attending Physician Name and Address:  ?Florencia Reasons, MD ? Relative Name and Phone Number:  ?Carol, Loftin (Spouse)   [phone] ?   ?Current Level of Care: ?Hospital Recommended Level of Care: ?[place] Prior Approval Number: ?  ? ?Date Approved/Denied: ?  PASRR Number: ?[id number] A ? ?Discharge Plan: ?SNF ?  ? ?Current Diagnoses: ?Patient Active Problem List  ? Diagnosis Date Noted  ? Sepsis ([place]) 12/30/2021  ? Parkinson's disease ([place]) 12/21/2021  ? Parkinson disease ([place]) 12/18/2021  ? Frequent falls 12/18/2021  ? History of CVA (cerebrovascular accident) 12/18/2021  ? 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 Hallucinations, visual 07/30/2021  ? Acetabulum fracture, left ([employer]) 06/25/2021  ? Acute hip pain, left 06/24/2021  ? Fall at home, initial encounter 06/24/2021  ? Leukocytosis 06/24/2021  ? Chronic diastolic heart failure ([place])   ? Sinusitis, acute maxillary 06/24/2020  ? AKI (acute kidney injury) ([place])   ? Labile blood glucose   ? Cough   ? Benign essential HTN   ? Skin lesion of hand   ? Hypoalbuminemia due to protein-calorie malnutrition ([place])   ? Controlled type 2 diabetes mellitus with hyperglycemia, without long-term current use of insulin ([place])   ? 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 Morbid obesity ([place])   ? Acute blood loss anemia   ? Acute thalamic infarction ([place]) 04/15/2020  ? 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 TIA (transient ischemic attack) 02/17/2018  ? Non-insulin dependent type 2 diabetes mellitus ([place]) 02/17/2018  ? Insomnia 02/01/2018  ? 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  Chronotropic incompetence 07/09/2014  ? Dyspnea on exertion 03/03/2014  ? HLD (hyperlipidemia) 12/13/2007  ? Essential hypertension 12/13/2007  ? OBESITY 12/05/2007  ? 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 Coronary atherosclerosis 12/05/2007  ? ? ?Orientation RESPIRATION BLADDER Height & Weight   ?  ?Self, Place ? O2 (2L [place]) External catheter Weight: 94.5 kg ?Height:  5\' 8"  (172.7 cm)  ?BEHAVIORAL SYMPTOMS/MOOD NEUROLOGICAL BOWEL NUTRITION STATUS  ?    Continent Diet (see d/c summary)  ?AMBULATORY STATUS COMMUNICATION OF NEEDS Skin   ?Extensive Assist Verbally Normal ?  ?  ?  ?    ?     ?     ? ? ?Personal Care Assistance Level of Assistance  ?Bathing, Feeding, Dressing Bathing Assistance: Maximum assistance ?Feeding assistance: Independent ?Dressing Assistance: Limited assistance ?   ? ?Functional Limitations Info  ?Sight, Hearing, Speech Sight Info: Adequate ?Hearing Info: Adequate ?Speech Info: Adequate  ? ? ?SPECIAL CARE FACTORS FREQUENCY  ?PT (By licensed PT), OT (By licensed OT)   ?  ?PT Frequency: 5X/W ?OT Frequency: 5X/W ?  ?  ?  ?   ? ? ?Contractures Contractures Info: Not present  ? ? ?Additional Factors Info  ?Code Status, Allergies Code Status Info: full ?Allergies Info: NKA ?  ?  ?  ?   ? ?Current Medications (01/01/2022):  This is the current hospital active medication list ?Current Facility-Administered Medications  ?Medication Dose Route Frequency Provider Last Rate Last Admin  ? 0.9 %  sodium chloride infusion  Intravenous Continuous Florencia Reasons, MD 75 mL/hr at 12/31/21 2330 New Bag at 12/31/21 2330  ? acetaminophen (TYLENOL) tablet 1,000 mg  1,000 mg Oral BID Florencia Reasons, MD   1,000 mg at 01/01/22 1014  ? atorvastatin (LIPITOR) tablet 20 mg  20 mg Oral Daily Florencia Reasons, MD   20 mg at 01/01/22 1014  ? carbidopa-levodopa (SINEMET IR) 25-100 MG per tablet immediate release 1 tablet  1 tablet Oral TID Florencia Reasons, MD   1 tablet at 01/01/22 1014  ? Carbidopa-Levodopa ER  (SINEMET CR) 25-100 MG tablet controlled release 1 tablet  1 tablet Oral QHS Florencia Reasons, MD   1 tablet at 12/31/21 2200  ? cefTRIAXone (ROCEPHIN) 2 g in sodium chloride 0.9 % 100 mL IVPB  2 g Intravenous Q24H Florencia Reasons, MD 200 mL/hr at 12/31/21 1822 2 g at 12/31/21 1822  ? cholecalciferol (VITAMIN D3) tablet 1,000 Units  1,000 Units Oral Daily Florencia Reasons, MD   1,000 Units at 01/01/22 1014  ? enoxaparin (LOVENOX) injection 40 mg  40 mg Subcutaneous Q24H Florencia Reasons, MD   40 mg at 12/31/21 2159  ? feeding supplement (GLUCERNA SHAKE) (GLUCERNA SHAKE) liquid 237 mL  237 mL Oral TID BM Florencia Reasons, MD   237 mL at 01/01/22 1019  ? haloperidol lactate (HALDOL) injection 1 mg  1 mg Intravenous Q6H PRN Florencia Reasons, MD      ? insulin aspart (novoLOG) injection 0-15 Units  0-15 Units Subcutaneous TID WC Florencia Reasons, MD   3 Units at 01/01/22 1013  ? melatonin tablet 10 mg  10 mg Oral QHS Florencia Reasons, MD   10 mg at 12/31/21 2159  ? metoprolol tartrate (LOPRESSOR) tablet 25 mg  25 mg Oral BID Florencia Reasons, MD   25 mg at 01/01/22 1014  ? metroNIDAZOLE (FLAGYL) IVPB 500 mg  500 mg Intravenous Q12H Florencia Reasons, MD 100 mL/hr at 01/01/22 1219 500 mg at 01/01/22 1219  ? multivitamin with minerals tablet 1 tablet  1 tablet Oral Daily Florencia Reasons, MD   1 tablet at 01/01/22 1014  ? nitroGLYCERIN (NITROSTAT) SL tablet 0.4 mg  0.4 mg Sublingual Q5 min PRN Florencia Reasons, MD      ? omega-3 acid ethyl esters (LOVAZA) capsule 1 g  1 g Oral Daily Florencia Reasons, MD   1 g at 01/01/22 1014  ? polyethylene glycol (MIRALAX / GLYCOLAX) packet 17 g  17 g Oral Daily Florencia Reasons, MD   17 g at 01/01/22 1014  ? QUEtiapine (SEROQUEL) tablet 12.5 mg  12.5 mg Oral QHS Florencia Reasons, MD   12.5 mg at 12/31/21 2159  ? senna-docusate (Senokot-S) tablet 1 tablet  1 tablet Oral BID Florencia Reasons, MD   1 tablet at 01/01/22 1014  ? traMADol (ULTRAM) tablet 50 mg  50 mg Oral Q6H PRN Florencia Reasons, MD      ? ? ? ?Discharge Medications: ?Please see discharge summary for a list of discharge medications. ? ?Relevant Imaging  Results: ? ?Relevant Lab Results: ? ? ?Additional Information ?SS# [phone] Covid vaccinated x 4 ? ?Trish Mage, LCSW ? ? ? ? ?

## 2022-01-01 NOTE — Progress Notes (Signed)
Speech Language Pathology Treatment: Dysphagia  ?Patient Details ?Name: Jordan [name] ?MRN: [id number] ?DOB: [birth year] ?Today's Date: 01/01/2022 ?Time: 1850-1920 ?SLP Time Calculation (min) (ACUTE ONLY): 30 min ? ?Assessment / Plan / Recommendation ?Clinical Impression ? Pt seen today for skilled SLP to address dysphagia goals. Wife, Jordan [name], at bedside. Pt on Cpap but willing to be taken off - SLP assisted pt to brush his teeth by holding emesis basin and providing toothbrush with paste.  ? ?Jordan [name] reports pt is trying to get into see an oral surgeon to remove worn down dentition.  Jordan [name] is an Therapist, sports and reports awareness to negative health consequences of poor dentition.   ? ?Pt willing to consume only few sips of liquids and did not want any food.  SLP provided him with a sandwich - of which he took only a single bite. Minimally prolonged mastication noted with minimal oral retention following swallow.  Liquid swallow facilitated clearance. Subtle cough x1 after liquid swallow note d- potential laryngeal penetration.  Overall no clinical indications of severe dysphagia.   Pt is making significant improvement due to improved mentation!  He however continues with decreased phonatory strength and mild dysphagia.  ? ?Recommend pt continue regular/thin diet with precautions.  SLP educated wife and pt to 3 pillars of aspiration pna risk, compensation strategies and indication for pt to maintain strength of cough, "hock" and phonation for airway protection. Further advised pt take medications with puree for ease of swallowing/improved airway protection.  Provided pt with IS for his wife to use with him.  Will follow up next week *if pt remains in hospital* to initiate RMST for airway protection with po given progressive neurological disease present and suspected mild dysphagia.  ?SLP wrote information/tips in handout for pt's wife per her request.  ?  ?HPI HPI: Per MD note, Pt is an 81 yo male adm to [facility] from 2/17 to 2/22 -  suspected due to medications - Seroquel was started.  Pt underwent kyphoplasty on 2/28 went home started have stomach pain and dry heave.  PMH of hypertension ,noninsulin-dependent type 2 diabetes, CVA, Parkinson's, bradycardia-s/p PPM placement, OSA, narcolepsy.  He was brought to the ED by EMS due to abdominal pain - put on oxygen by EMS due to o2 sats at 90's.  Wife reported to Md that patient has sleep apnea.  Wife reported to MD that patient has intermittent chronic cough.  CT abdomen Lower chest: Small right and trace left pleural effusions new from 10/29/2021.  Per MD note, Sepsis present on admission with tachypnea, leukocytosis, lactic acidosis, fever, source of infection likely intra-abdominal  - initial CT concerning for Pancreatitis versus proximal sigmoid colon diverticulitis, with elevated lipase, normal LFT.  Swallow evaluation ordered per RN discussion with wife and concern for pt having dysphagia. RN and NT report pt has not consumed po intake today due to his mentation.  Per prior brain imaging studies -Chronic infarct left frontal lobe anteriorly. Small  chronic infarcts in the cerebellum bilaterally. Chronic infarcts right and left thalamic. ?  ?   ?SLP Plan ? Continue with current plan of care;Other (Comment) (consider RMST to strengthen cough/voice due to Parkinsons') ? ?  ?  ?Recommendations for follow up therapy are one component of a multi-disciplinary discharge planning process, led by the attending physician.  Recommendations may be updated based on patient status, additional functional criteria and insurance authorization. ?  ? ?Recommendations  ?Diet recommendations: Regular;Thin liquid ?Liquids provided via: Cup;Straw ?Medication Administration: Other (Comment) (prefer  with puree currently due to acute illness) ?Supervision: Full supervision/cueing for compensatory strategies;Trained caregiver to feed patient ?Compensations: Slow rate;Small sips/bites;Other (Comment) ?Postural Changes  and/or Swallow Maneuvers: Seated upright 90 degrees;Upright 30-60 min after meal  ?   ?    ?   ? ? ? ? Oral Care Recommendations: Oral care BID ?Follow Up Recommendations: Skilled nursing-short term rehab (<3 hours/day) ?Assistance recommended at discharge: Frequent or constant Supervision/Assistance ?SLP Visit Diagnosis: Dysphagia, oral phase (R13.11);Dysphagia, unspecified (R13.10) ?Plan: Continue with current plan of care;Other (Comment) (consider RMST to strengthen cough/voice due to Parkinsons') ? ? ? ? ?  ?  ? ?Jordan Lime, MS CCC SLP ?Acute Rehab Services ?Office [phone] ?Pager [phone] ? ?Jordan [name] ? ?01/01/2022, 7:36 PM ?

## 2022-01-01 NOTE — Progress Notes (Signed)
Pts home unit cpap and wife assist pt to wear it. Machine is ready to be worn.  ?

## 2022-01-02 DIAGNOSIS — G4733 Obstructive sleep apnea (adult) (pediatric): Secondary | ICD-10-CM | POA: Diagnosis not present

## 2022-01-02 DIAGNOSIS — E119 Type 2 diabetes mellitus without complications: Secondary | ICD-10-CM | POA: Diagnosis not present

## 2022-01-02 DIAGNOSIS — G20 Parkinson's disease: Secondary | ICD-10-CM | POA: Diagnosis not present

## 2022-01-02 DIAGNOSIS — Z950 Presence of cardiac pacemaker: Secondary | ICD-10-CM | POA: Diagnosis not present

## 2022-01-02 LAB — URINE CULTURE: Culture: NO GROWTH

## 2022-01-02 LAB — CBC WITH DIFFERENTIAL/PLATELET
Abs Immature Granulocytes: 0.08 10*3/uL — ABNORMAL HIGH (ref 0.00–0.07)
Basophils Absolute: 0 10*3/uL (ref 0.0–0.1)
Basophils Relative: 0 %
Eosinophils Absolute: 0.3 10*3/uL (ref 0.0–0.5)
Eosinophils Relative: 2 %
HCT: 35.6 % — ABNORMAL LOW (ref 39.0–52.0)
Hemoglobin: 11.7 g/dL — ABNORMAL LOW (ref 13.0–17.0)
Immature Granulocytes: 1 %
Lymphocytes Relative: 11 %
Lymphs Abs: 1.6 10*3/uL (ref 0.7–4.0)
MCH: 30.1 pg (ref 26.0–34.0)
MCHC: 32.9 g/dL (ref 30.0–36.0)
MCV: 91.5 fL (ref 80.0–100.0)
Monocytes Absolute: 1.1 10*3/uL — ABNORMAL HIGH (ref 0.1–1.0)
Monocytes Relative: 7 %
Neutro Abs: 11.2 10*3/uL — ABNORMAL HIGH (ref 1.7–7.7)
Neutrophils Relative %: 79 %
Platelets: 192 10*3/uL (ref 150–400)
RBC: 3.89 MIL/uL — ABNORMAL LOW (ref 4.22–5.81)
RDW: 13.2 % (ref 11.5–15.5)
WBC: 14.3 10*3/uL — ABNORMAL HIGH (ref 4.0–10.5)
nRBC: 0 % (ref 0.0–0.2)

## 2022-01-02 LAB — BASIC METABOLIC PANEL
Anion gap: 7 (ref 5–15)
BUN: 19 mg/dL (ref 8–23)
CO2: 23 mmol/L (ref 22–32)
Calcium: 8 mg/dL — ABNORMAL LOW (ref 8.9–10.3)
Chloride: 103 mmol/L (ref 98–111)
Creatinine, Ser: 0.76 mg/dL (ref 0.61–1.24)
GFR, Estimated: >60 mL/min (ref >60)
Glucose, Bld: 113 mg/dL — ABNORMAL HIGH (ref 70–99)
Potassium: 3.2 mmol/L — ABNORMAL LOW (ref 3.5–5.1)
Sodium: 133 mmol/L — ABNORMAL LOW (ref 135–145)

## 2022-01-02 LAB — GLUCOSE, CAPILLARY
Glucose-Capillary: 111 mg/dL — ABNORMAL HIGH (ref 70–99)
Glucose-Capillary: 176 mg/dL — ABNORMAL HIGH (ref 70–99)
Glucose-Capillary: 132 mg/dL — ABNORMAL HIGH (ref 70–99)
Glucose-Capillary: 178 mg/dL — ABNORMAL HIGH (ref 70–99)

## 2022-01-02 LAB — MAGNESIUM: Magnesium: 1.8 mg/dL (ref 1.7–2.4)

## 2022-01-02 MED ORDER — SODIUM CHLORIDE 0.9 % IV SOLN: INTRAVENOUS | Status: DC

## 2022-01-02 MED ORDER — MAGNESIUM SULFATE 2 GM/50ML IV SOLN
2.0000 g | Freq: Once | INTRAVENOUS | Status: AC
  Administered 2022-01-02: 2 g via INTRAVENOUS
  Filled 2022-01-02: qty 50

## 2022-01-02 MED ORDER — HYDRALAZINE HCL 10 MG PO TABS
10.0000 mg | ORAL_TABLET | Freq: Two times a day (BID) | ORAL | Status: DC
  Administered 2022-01-02 – 2022-01-08 (×13): 10 mg via ORAL
  Filled 2022-01-02 (×14): qty 1

## 2022-01-02 MED ORDER — POTASSIUM CHLORIDE 20 MEQ PO PACK
40.0000 meq | PACK | Freq: Once | ORAL | Status: AC
  Administered 2022-01-02: 40 meq via ORAL
  Filled 2022-01-02: qty 2

## 2022-01-02 NOTE — Progress Notes (Addendum)
?PROGRESS NOTE ? ? ? ?JEKHI BOLIN  BTD:[id number] DOB: 1941/09/18 DOA: 12/30/2021 ?PCP: [name], MD  ? ? ? ?Brief Narrative:  ? ?Jordan [name] is a 81 y.o. male with medical history significant of hypertension ,noninsulin-dependent type 2 diabetes, CVA, Parkinson's, bradycardia-s/p PPM placement, OSA, narcolepsy, brought to ED by EMS due to due to abdominal pain ? ?Subjective: ? ?Tmax 99.3 ?Wbc still elevated but start to trend down ?Lactic acid and lipase has normalized ?Denies pain, no tremor observed, he is improving ,speech is clear , there is no delay in speech, he is aaox3 this am ?Reports feeling tired, poor oral intake  ? ?Assessment & Plan: ? Principal Problem: ?  Sepsis ([place]) ?Active Problems: ?  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  Presence of permanent cardiac pacemaker ?  Non-insulin dependent type 2 diabetes mellitus (Jordan [name]) ?  Parkinson's disease (Jordan [name]) ? ? ? ?Assessment and Plan: ? ? ?Sepsis present on admission with tachypnea, leukocytosis, lactic acidosis, fever, source of infection likely intra-abdominal ?- initial CT is concerning for Pancreatitis versus proximal sigmoid colon diverticulitis, he does has elevated lipase, normal LFT ?-Blood culture obtained in the ED in process, no growth so far ?-COVID screening negative ?-urine culture no growth ?- MRSA screen negative  ?-He received vancomycin, cefepime, Flagyl in the ED,  ?-continue Rocephin Flagyl to cover intrabdominal source  ?- continue hydration for another 24hrs ?-improving  ? ?Pancreatitis ?-lipase normalized, he reports ab pain resolved, advance diet to carb modified /low fat diet ?  ? Acute metabolic encephalopathy ?Wife report patient had agitation episode during last hospitalization ?Wife does not want patient to get prn Haldol or put on restraint  unless she gave permission ?Delirium precaution, wife staying overnight  ?on agitation, improving , aaox3 on 3/4 am ? ?Hypokalemia/hypomagnesemia ?Replace, recheck in the  morning ? ?Hyponatremia ?Likely due to dehydration ?Continue hydration for another 24 hours ?  ?Non-insulin-dependent type 2 diabetes ?Hold metformin ?Keep on SSI ?  ?Hypertension ?Continue metoprolol,  ?hold lisinopril in the setting of elevated lipase/pancreatitis ?Bp start to increase, start low dose hydralazine ?  ?CVA/Parkinson's ?Tend to lean towards right side which is chronic per wife ?Continue home medication ?  ?OSA continue CPAP ?  ?Hepatic lesions appear to be mostly stable except a new low density lobular lesion ?Detail please see CT ab/pel obtained on admission ?  ?Bilateral nonobstructing renal stones ?Renal function unremarkable ?Urine , no sediment, but is tea color, monitor  ?  ?L2 kyphoplasty On 2/28 ?  ?Subacute or older inferior left pubic ramus nondisplaced fracture ?Physical therapy ?  ?FTT: frequent falls , 7-10 soft falls per wife, ( wife called EMS for fall assist) ,  ?Several hospitalizations since 06/2021 when he was hospitalized for left acetabular fracture. ? Pt eval ? ? ? ?Obesity Body mass index is 31.68 kg/m?Jordan KitchenMarland [name] ?OSA on cpap ? ? ? ?I have Reviewed nursing notes, Vitals, pain scores, I/o's, Lab results and  imaging results since pt's last encounter, details please see discussion above  ?I ordered the following labs:  ?Unresulted Labs (From admission, onward)  ? ?  Start     Ordered  ? 01/06/22 0500  Creatinine, serum  (enoxaparin (LOVENOX)    CrCl >/= 30 ml/min)  Weekly,   R     ?Comments: while on enoxaparin therapy ?  ? 12/30/21 1956  ? 01/03/22 1062  Basic metabolic panel  Daily,   R     ? 01/02/22 1306  ? 01/03/22 0500  Magnesium  Tomorrow morning,   STAT       ? 01/02/22 1306  ? 01/01/22 0500  CBC with Differential/Platelet  Tomorrow morning,   R       ? 12/31/21 1536  ? ?  ?  ? ?  ? ? ? ?DVT prophylaxis: enoxaparin (LOVENOX) injection 40 mg Start: 12/30/21 2200 ? ? ?Code Status:   Code Status: Full Code ? ?Family Communication: wife over the phone  ?Disposition:  ? ?Status is:  Inpatient ? ?Dispo: The patient is from: Home ?             Anticipated d/c is to: SNF vs CIR ( wife prefers CIR)  ?             Anticipated d/c date is: likely will be medically stable to d/c on monday ? ?Antimicrobials:   ? ?Anti-infectives (From admission, onward)  ? ? Start     Dose/Rate Route Frequency Ordered Stop  ? 12/30/21 2200  metroNIDAZOLE (FLAGYL) IVPB 500 mg       ? 500 mg ?100 mL/hr over 60 Minutes Intravenous Every 12 hours 12/30/21 1755    ? 12/30/21 1845  cefTRIAXone (ROCEPHIN) 2 g in sodium chloride 0.9 % 100 mL IVPB       ? 2 g ?200 mL/hr over 30 Minutes Intravenous Every 24 hours 12/30/21 1755    ? 12/30/21 1245  ceFEPIme (MAXIPIME) 2 g in sodium chloride 0.9 % 100 mL IVPB       ? 2 g ?200 mL/hr over 30 Minutes Intravenous  Once 12/30/21 1234 12/30/21 1342  ? 12/30/21 1245  metroNIDAZOLE (FLAGYL) IVPB 500 mg       ? 500 mg ?100 mL/hr over 60 Minutes Intravenous  Once 12/30/21 1234 12/30/21 1417  ? 12/30/21 1245  vancomycin (VANCOCIN) IVPB 1000 mg/200 mL premix       ? 1,000 mg ?200 mL/hr over 60 Minutes Intravenous  Once 12/30/21 1234 12/30/21 1417  ? ?  ? ? ? ? ? ?Objective: ?Vitals:  ? 01/01/22 0558 01/01/22 1409 01/01/22 2010 01/02/22 0649  ?BP: (!) 142/76 (!) 169/86 (!) 142/84 (!) 145/78  ?Pulse: 66 63 79 79  ?Resp: '20 20 20 18  '$ ?Temp: 100 ?F (37.8 ?C) 99.3 ?F (37.4 ?C) 98.3 ?F (36.8 ?C) 98.8 ?F (37.1 ?C)  ?TempSrc: Oral Oral Oral Oral  ?SpO2: 90% 90% 92% 92%  ?Weight:      ?Height:      ? ? ?Intake/Output Summary (Last 24 hours) at 01/02/2022 1315 ?Last data filed at 01/02/2022 1011 ?Gross per 24 hour  ?Intake 230 ml  ?Output 1050 ml  ?Net -820 ml  ? ?Filed Weights  ? 12/30/21 1916  ?Weight: 94.5 kg  ? ? ?Examination: ? ?General exam: Frail, aaox3, calm and cooperative, speech is clear, there is no delay ?Respiratory system: Clear to auscultation. Respiratory effort normal. ?Cardiovascular system:  RRR.  ?Gastrointestinal system: Abdomen is nondistended, soft and nontender.  Normal bowel sounds  heard. ?Central nervous system: aaox3, No focal neurological deficits. ?Extremities:  no edema ?Skin: No rashes, lesions or ulcers ?Psychiatry: Calm, currently no agitation ? ? ?Data Reviewed: I have personally reviewed  labs and visualized  imaging studies since the last encounter and formulate the plan  ? ? ? ? ? ? ?Scheduled Meds: ? acetaminophen  1,000 mg Oral BID  ? atorvastatin  20 mg Oral Daily  ? carbidopa-levodopa  1 tablet Oral TID  ? Carbidopa-Levodopa ER  1 tablet  Oral QHS  ? cholecalciferol  1,000 Units Oral Daily  ? enoxaparin (LOVENOX) injection  40 mg Subcutaneous Q24H  ? feeding supplement (GLUCERNA SHAKE)  237 mL Oral TID BM  ? hydrALAZINE  10 mg Oral BID  ? insulin aspart  0-15 Units Subcutaneous TID WC  ? melatonin  10 mg Oral QHS  ? metoprolol tartrate  25 mg Oral BID  ? multivitamin with minerals  1 tablet Oral Daily  ? omega-3 acid ethyl esters  1 g Oral Daily  ? polyethylene glycol  17 g Oral Daily  ? QUEtiapine  12.5 mg Oral QHS  ? senna-docusate  1 tablet Oral BID  ? ?Continuous Infusions: ? sodium chloride    ? cefTRIAXone (ROCEPHIN)  IV 2 g (01/01/22 1811)  ? metronidazole Stopped (01/02/22 1102)  ? ? ? LOS: 3 days  ? ? ?Florencia Reasons, MD PhD FACP ?[facility] ? ?Available via Epic secure chat 7am-7pm for nonurgent issues ?Please page for urgent issues ?To page the attending provider between 7A-7P or the covering provider during after hours 7P-7A, please log into the web site [email or web address] and access using universal [place] password for that web site. If you do not have the password, please call the hospital operator. ? ? ? ?01/02/2022, 1:15 PM  ? ? ?

## 2022-01-03 ENCOUNTER — Inpatient Hospital Stay (HOSPITAL_COMMUNITY): Payer: Medicare (Managed Care)

## 2022-01-03 DIAGNOSIS — G20 Parkinson's disease: Secondary | ICD-10-CM | POA: Diagnosis not present

## 2022-01-03 DIAGNOSIS — E119 Type 2 diabetes mellitus without complications: Secondary | ICD-10-CM | POA: Diagnosis not present

## 2022-01-03 DIAGNOSIS — Z950 Presence of cardiac pacemaker: Secondary | ICD-10-CM | POA: Diagnosis not present

## 2022-01-03 DIAGNOSIS — G4733 Obstructive sleep apnea (adult) (pediatric): Secondary | ICD-10-CM | POA: Diagnosis not present

## 2022-01-03 LAB — BASIC METABOLIC PANEL
Anion gap: 8 (ref 5–15)
BUN: 17 mg/dL (ref 8–23)
CO2: 23 mmol/L (ref 22–32)
Calcium: 8.4 mg/dL — ABNORMAL LOW (ref 8.9–10.3)
Chloride: 103 mmol/L (ref 98–111)
Creatinine, Ser: 0.81 mg/dL (ref 0.61–1.24)
GFR, Estimated: >60 mL/min (ref >60)
Glucose, Bld: 141 mg/dL — ABNORMAL HIGH (ref 70–99)
Potassium: 4.1 mmol/L (ref 3.5–5.1)
Sodium: 134 mmol/L — ABNORMAL LOW (ref 135–145)

## 2022-01-03 LAB — GLUCOSE, CAPILLARY
Glucose-Capillary: 140 mg/dL — ABNORMAL HIGH (ref 70–99)
Glucose-Capillary: 204 mg/dL — ABNORMAL HIGH (ref 70–99)
Glucose-Capillary: 150 mg/dL — ABNORMAL HIGH (ref 70–99)
Glucose-Capillary: 183 mg/dL — ABNORMAL HIGH (ref 70–99)

## 2022-01-03 LAB — MAGNESIUM: Magnesium: 1.9 mg/dL (ref 1.7–2.4)

## 2022-01-03 MED ORDER — FUROSEMIDE 10 MG/ML IJ SOLN
20.0000 mg | Freq: Once | INTRAMUSCULAR | Status: AC
  Administered 2022-01-03: 20 mg via INTRAVENOUS
  Filled 2022-01-03: qty 2

## 2022-01-03 MED ORDER — POLYVINYL ALCOHOL 1.4 % OP SOLN
1.0000 [drp] | OPHTHALMIC | Status: DC | PRN
  Filled 2022-01-03: qty 15

## 2022-01-03 MED ORDER — FUROSEMIDE 10 MG/ML IJ SOLN
20.0000 mg | Freq: Every day | INTRAMUSCULAR | Status: DC
  Administered 2022-01-04: 20 mg via INTRAVENOUS
  Filled 2022-01-03: qty 2

## 2022-01-03 MED ORDER — MAGNESIUM OXIDE -MG SUPPLEMENT 400 (240 MG) MG PO TABS
400.0000 mg | ORAL_TABLET | Freq: Every day | ORAL | Status: AC
  Administered 2022-01-03 – 2022-01-04 (×2): 400 mg via ORAL
  Filled 2022-01-03 (×2): qty 1

## 2022-01-03 NOTE — Plan of Care (Signed)
?  Problem: Fluid Volume: ?Goal: Hemodynamic stability will improve ?Outcome: Progressing ?  ?Problem: Clinical Measurements: ?Goal: Diagnostic test results will improve ?Outcome: Progressing ?Goal: Signs and symptoms of infection will decrease ?Outcome: Progressing ?  ?Problem: Respiratory: ?Goal: Ability to maintain adequate ventilation will improve ?Outcome: Progressing ?  ?Problem: Education: ?Goal: Knowledge of General Education information will improve ?Description: Including pain rating scale, medication(s)/side effects and non-pharmacologic comfort measures ?Outcome: Progressing ?  ?Problem: Health Behavior/Discharge Planning: ?Goal: Ability to manage health-related needs will improve ?Outcome: Progressing ?  ?Problem: Clinical Measurements: ?Goal: Ability to maintain clinical measurements within normal limits will improve ?Outcome: Progressing ?Goal: Will remain free from infection ?Outcome: Progressing ?Goal: Diagnostic test results will improve ?Outcome: Progressing ?Goal: Respiratory complications will improve ?Outcome: Progressing ?Goal: Cardiovascular complication will be avoided ?Outcome: Progressing ?  ?Problem: Activity: ?Goal: Risk for activity intolerance will decrease ?Outcome: Progressing ?  ?Problem: Nutrition: ?Goal: Adequate nutrition will be maintained ?Outcome: Progressing ?  ?Problem: Coping: ?Goal: Level of anxiety will decrease ?Outcome: Progressing ?  ?Problem: Elimination: ?Goal: Will not experience complications related to bowel motility ?Outcome: Progressing ?Goal: Will not experience complications related to urinary retention ?Outcome: Progressing ?  ?Problem: Safety: ?Goal: Ability to remain free from injury will improve ?Outcome: Progressing ?  ?Problem: Skin Integrity: ?Goal: Risk for impaired skin integrity will decrease ?Outcome: Progressing ?  ?

## 2022-01-03 NOTE — Progress Notes (Signed)
Speech Language Pathology Treatment: Dysphagia  ?Patient Details ?Name: Jordan [name] ?MRN: [id number] ?DOB: January 22, 1941 ?Today's Date: 01/03/2022 ?Time: 6712-4580 ?SLP Time Calculation (min) (ACUTE ONLY): 11 min ? ?Assessment / Plan / Recommendation ?Clinical Impression ? Pt seen by ST 3/3 with decreased phonatory strength and mild dysphagia and improved mentation. Dr. Erlinda Hong requested  ST to return today due to increased coughing with thin. He was alert and pleasant without family at bedside. Despite cup or straw sips thin he consistently coughed with each trial. He stated he could not differentiate coughing with meals or while resting earlier today. Given s/sx possible airway intrusion since admission, recommend thickening liquids to nectar thick and plan for MBS tomorrow. Continue regular texture, nectar thick.  ?  ?HPI HPI: Per MD note, Pt is an 81 yo male adm to [facility] from 2/17 to 2/22 - suspected due to medications - Seroquel was started.  Pt underwent kyphoplasty on 2/28 went home started have stomach pain and dry heave.  PMH of hypertension ,noninsulin-dependent type 2 diabetes, CVA, Parkinson's, bradycardia-s/p PPM placement, OSA, narcolepsy.  He was brought to the ED by EMS due to abdominal pain - put on oxygen by EMS due to o2 sats at 90's.  Wife reported to Md that patient has sleep apnea.  Wife reported to MD that patient has intermittent chronic cough.  CT abdomen Lower chest: Small right and trace left pleural effusions new from 10/29/2021.  Per MD note, Sepsis present on admission with tachypnea, leukocytosis, lactic acidosis, fever, source of infection likely intra-abdominal  - initial CT concerning for Pancreatitis versus proximal sigmoid colon diverticulitis, with elevated lipase, normal LFT.  Swallow evaluation ordered per RN discussion with wife and concern for pt having dysphagia. RN and NT report pt has not consumed po intake today due to his mentation.  Per prior brain imaging studies -Chronic  infarct left frontal lobe anteriorly. Small  chronic infarcts in the cerebellum bilaterally. Chronic infarcts right and left thalamic. ?  ?   ?SLP Plan ? MBS ? ?  ?  ?Recommendations for follow up therapy are one component of a multi-disciplinary discharge planning process, led by the attending physician.  Recommendations may be updated based on patient status, additional functional criteria and insurance authorization. ?  ? ?Recommendations  ?Diet recommendations: Regular;Nectar-thick liquid ?Liquids provided via: Cup;Straw ?Medication Administration: Whole meds with puree ?Supervision: Full supervision/cueing for compensatory strategies;Staff to assist with self feeding ?Compensations: Slow rate;Small sips/bites ?Postural Changes and/or Swallow Maneuvers: Seated upright 90 degrees;Upright 30-60 min after meal  ?   ?    ?   ? ? ? ? Oral Care Recommendations: Oral care BID ?Follow Up Recommendations: Skilled nursing-short term rehab (<3 hours/day) ?Assistance recommended at discharge: Frequent or constant Supervision/Assistance ?SLP Visit Diagnosis: Dysphagia, oral phase (R13.11);Dysphagia, unspecified (R13.10) ?Plan: MBS ? ? ? ? ?  ?  ? ? ?Houston Siren ? ?01/03/2022, 3:19 PM ?

## 2022-01-03 NOTE — TOC Progression Note (Addendum)
Transition of Care (TOC) - Progression Note  ? ? ?Patient Details  ?Name: Jordan [name] ?MRN: [id number] ?Date of Birth: Nov 27, 1940 ? ?Transition of Care (TOC) CM/SW Contact  ?Rayleigh Gillyard, Marta Lamas, LCSW ?Phone Number: ?01/03/2022, 11:15 AM ? ?Clinical Narrative:    ? ?Extensive conversation with patient's wife, Cletus Paris to discuss skilled nursing facility placement arrangements for short-term rehabilitative services.  Explained to Mrs. Mccollam that no official bed offers have been received thus far, but provided her with the list of pending offers.  Mrs. Lipe was only agreeable to 4* and above rated facilities, fixated on the fact that she has done her research, and knows which facilities "are acceptable" and which facilities "are not acceptable".  LCSW tried explaining the difference between inpatient rehabilitation services and skilled nursing facility services, noting how facilities make a consorted effort to work with PT/OT Notes and [employer] Medicare to make determination decision.  Mrs. Lotito reported that she would like for patient to be placed at [facility].  FL-2 Form, PT/OT Notes/Recommendations, Demographics Information, and H&P sent to [facility] via [place] on 01/03/2022.  HIPAA compliant message left on voicemail for [facility], Admissions Coordinator with [facility] [phone]).  [facility] will continue to follow for disposition.  LCSW sent secure chat message to Blondell Reveal, Inpatient Physical Therapist, reporting that Mrs. Kazlauskas is interested in inpatient rehabilitation services at [facility], requesting follow-up and recommendations.  LCSW will follow-up with bed offers again on 01/04/2022.   ? ?Expected Discharge Plan: [place] ?Barriers to Discharge: SNF Pending bed offer ? ?Expected Discharge Plan and Services ?Expected Discharge Plan: [place] ?  ?Discharge  Planning Services: CM Consult ?Post Acute Care Choice: [place] ?Living arrangements for the past 2 months: Apartment ? ? ?Social Determinants of Health (SDOH) Interventions ? ?Readmission Risk Interventions ?No flowsheet data found. ? ?

## 2022-01-03 NOTE — Progress Notes (Addendum)
PROGRESS NOTE    Jordan [name]  QJF:[id number] DOB: 02-10-1941 DOA: 12/30/2021 PCP: [name], MD     Brief Narrative:   Jordan [name] is a 81 y.o. male with medical history significant of hypertension ,noninsulin-dependent type 2 diabetes, CVA, Parkinson's, bradycardia-s/p PPM placement, OSA, narcolepsy, brought to ED by EMS due to due to abdominal pain  Subjective:  Tmax 99.7 Wbc still elevated but start to trend down Some cough when drinking thin liquid, Some sob Tolerating regular diet, Denies pain, no tremor observed, he is aaox3 this am Reports feeling tired, intermittent hallucination which is not new  Assessment & Plan:  Principal Problem:   Sepsis ([place]) Active Problems:   OSA on CPAP/narcolepsy    Presence of permanent cardiac pacemaker   Non-insulin dependent type 2 diabetes mellitus ([place])   Parkinson's disease ([place])    Assessment and Plan:   Sepsis present on admission with tachypnea, leukocytosis, lactic acidosis, fever, source of infection likely intra-abdominal - initial CT is concerning for Pancreatitis versus proximal sigmoid colon diverticulitis, he does has elevated lipase, normal LFT -Blood culture obtained in the ED in process, no growth so far -COVID screening negative -urine culture no growth - MRSA screen negative  -He received vancomycin, cefepime, Flagyl in the ED,  -continue Rocephin Flagyl to cover intrabdominal source plan for total of 7 days, today is 5/7. - improving   Coughing/sob -continue aspiration precaution, repeat speech eval -getting cxr -one dose lasix on 3/5  Addendum, cxr with mild pulmonary edema, plan to continue lasix on 3/6, order placed , monitor volume status   Pancreatitis -lipase normalized, he reports ab pain resolved, advance diet to carb modified /low fat diet    Acute metabolic encephalopathy Wife report patient had agitation episode during last hospitalization Wife does not want patient to get prn  Haldol or put on restraint  unless she gave permission Delirium precaution, wife staying overnight  on agitation, improving , aaox3 on 3/4 am  Hypokalemia/hypomagnesemia Improved, monitor   Hyponatremia Likely due to dehydration Improving, now concerns for volume overload, d/c ivf, encourage oral intake   Non-insulin-dependent type 2 diabetes Hold metformin Keep on SSI   Hypertension Continue metoprolol,  hold lisinopril in the setting of elevated lipase/pancreatitis Bp start to increase, start low dose hydralazine   CVA/Parkinson's Tend to lean towards right side which is chronic per wife Continue home medication   OSA continue CPAP   Hepatic lesions appear to be mostly stable except a new low density lobular lesion Detail please see CT ab/pel obtained on admission   Bilateral nonobstructing renal stones Renal function unremarkable Urine , no sediment, but is tea color, monitor    L2 kyphoplasty On 2/28   Subacute or older inferior left pubic ramus nondisplaced fracture Physical therapy   FTT: frequent falls , 7-10 soft falls per wife, ( wife called EMS for fall assist) ,  Several hospitalizations since 06/2021 when he was hospitalized for left acetabular fracture.  Pt eval    Obesity Body mass index is 31.68 kg/m.. OSA on cpap    I have Reviewed nursing notes, Vitals, pain scores, I/o's, Lab results and  imaging results since pt's last encounter, details please see discussion above  I ordered the following labs:  Unresulted Labs (From admission, onward)     Start     Ordered   01/06/22 0500  Creatinine, serum  (enoxaparin (LOVENOX)    CrCl >/= 30 ml/min)  Weekly,   R  Comments: while on enoxaparin therapy    12/30/21 1956   01/04/22 0500  CBC  Tomorrow morning,   R       Question:  Specimen collection method  Answer:  Lab=Lab collect   01/03/22 1101   01/04/22 7425  Basic metabolic panel  Tomorrow morning,   R       Question:  Specimen collection  method  Answer:  Lab=Lab collect   01/03/22 1101   01/04/22 0500  Magnesium  Tomorrow morning,   STAT       Question:  Specimen collection method  Answer:  Lab=Lab collect   01/03/22 1101   01/03/22 9563  Basic metabolic panel  Daily,   R      01/02/22 1306   01/01/22 0500  CBC with Differential/Platelet  Tomorrow morning,   R        12/31/21 1536             DVT prophylaxis: enoxaparin (LOVENOX) injection 40 mg Start: 12/30/21 2200   Code Status:   Code Status: Full Code  Family Communication: wife at bedside  Disposition:   Status is: Inpatient  Dispo: The patient is from: Home              Anticipated d/c is to: SNF vs CIR ( wife prefers CIR, [facility] in [place])               Anticipated d/c date is: likely will be medically stable to d/c on monday  Antimicrobials:    Anti-infectives (From admission, onward)    Start     Dose/Rate Route Frequency Ordered Stop   12/30/21 2200  metroNIDAZOLE (FLAGYL) IVPB 500 mg        500 mg 100 mL/hr over 60 Minutes Intravenous Every 12 hours 12/30/21 1755     12/30/21 1845  cefTRIAXone (ROCEPHIN) 2 g in sodium chloride 0.9 % 100 mL IVPB        2 g 200 mL/hr over 30 Minutes Intravenous Every 24 hours 12/30/21 1755     12/30/21 1245  ceFEPIme (MAXIPIME) 2 g in sodium chloride 0.9 % 100 mL IVPB        2 g 200 mL/hr over 30 Minutes Intravenous  Once 12/30/21 1234 12/30/21 1342   12/30/21 1245  metroNIDAZOLE (FLAGYL) IVPB 500 mg        500 mg 100 mL/hr over 60 Minutes Intravenous  Once 12/30/21 1234 12/30/21 1417   12/30/21 1245  vancomycin (VANCOCIN) IVPB 1000 mg/200 mL premix        1,000 mg 200 mL/hr over 60 Minutes Intravenous  Once 12/30/21 1234 12/30/21 1417          Objective: Vitals:   01/02/22 0649 01/02/22 1339 01/02/22 2002 01/03/22 0557  BP: (!) 145/78 117/63 (!) 155/75 (!) 174/86  Pulse: 79 76 74 70  Resp: '18 18 20 20  '$ Temp: 98.8 F (37.1 C) 98.9 F (37.2 C) 98.7 F (37.1 C) 99.7 F (37.6 C)   TempSrc: Oral Oral Oral Oral  SpO2: 92% 94% 93% 95%  Weight:      Height:        Intake/Output Summary (Last 24 hours) at 01/03/2022 1114 Last data filed at 01/03/2022 1000 Gross per 24 hour  Intake 1853.52 ml  Output 1600 ml  Net 253.52 ml   Filed Weights   12/30/21 1916  Weight: 94.5 kg    Examination:  General exam: Frail, aaox3, calm and cooperative, some intermittent hallucination ,  reports it is now new Respiratory system: diminished at basis,  no wheezing, no rales, no rhonchi, appear slightly tachypnea, on room air. Cardiovascular system:  RRR.  Gastrointestinal system: protuberance Abdomen, soft and nontender.  Normal bowel sounds heard. Central nervous system: aaox3, No focal neurological deficits. Extremities:  no edema Skin: No rashes, lesions or ulcers Psychiatry: Calm, currently no agitation   Data Reviewed: I have personally reviewed  labs and visualized  imaging studies since the last encounter and formulate the plan        Scheduled Meds:  acetaminophen  1,000 mg Oral BID   atorvastatin  20 mg Oral Daily   carbidopa-levodopa  1 tablet Oral TID   Carbidopa-Levodopa ER  1 tablet Oral QHS   cholecalciferol  1,000 Units Oral Daily   enoxaparin (LOVENOX) injection  40 mg Subcutaneous Q24H   feeding supplement (GLUCERNA SHAKE)  237 mL Oral TID BM   furosemide  20 mg Intravenous Once   hydrALAZINE  10 mg Oral BID   insulin aspart  0-15 Units Subcutaneous TID WC   magnesium oxide  400 mg Oral Daily   melatonin  10 mg Oral QHS   metoprolol tartrate  25 mg Oral BID   multivitamin with minerals  1 tablet Oral Daily   omega-3 acid ethyl esters  1 g Oral Daily   polyethylene glycol  17 g Oral Daily   QUEtiapine  12.5 mg Oral QHS   senna-docusate  1 tablet Oral BID   Continuous Infusions:  cefTRIAXone (ROCEPHIN)  IV 2 g (01/02/22 1846)   metronidazole 500 mg (01/03/22 0841)     LOS: 4 days    Florencia Reasons, MD PhD FACP [facility]  Available via  [employer] secure chat 7am-7pm for nonurgent issues Please page for urgent issues To page the attending provider between 7A-7P or the covering provider during after hours 7P-7A, please log into the web site [email or web address] and access using universal [place] password for that web site. If you do not have the password, please call the hospital operator.    01/03/2022, 11:14 AM

## 2022-01-04 ENCOUNTER — Inpatient Hospital Stay (HOSPITAL_COMMUNITY): Payer: Medicare (Managed Care)

## 2022-01-04 DIAGNOSIS — E669 Obesity, unspecified: Secondary | ICD-10-CM | POA: Diagnosis not present

## 2022-01-04 DIAGNOSIS — K859 Acute pancreatitis without necrosis or infection, unspecified: Secondary | ICD-10-CM | POA: Diagnosis not present

## 2022-01-04 DIAGNOSIS — G9341 Metabolic encephalopathy: Secondary | ICD-10-CM | POA: Diagnosis not present

## 2022-01-04 DIAGNOSIS — I10 Essential (primary) hypertension: Secondary | ICD-10-CM

## 2022-01-04 LAB — CULTURE, BLOOD (ROUTINE X 2)
Culture: NO GROWTH
Culture: NO GROWTH

## 2022-01-04 LAB — CBC
HCT: 36 % — ABNORMAL LOW (ref 39.0–52.0)
Hemoglobin: 11.9 g/dL — ABNORMAL LOW (ref 13.0–17.0)
MCH: 29.7 pg (ref 26.0–34.0)
MCHC: 33.1 g/dL (ref 30.0–36.0)
MCV: 89.8 fL (ref 80.0–100.0)
Platelets: 247 10*3/uL (ref 150–400)
RBC: 4.01 MIL/uL — ABNORMAL LOW (ref 4.22–5.81)
RDW: 13.2 % (ref 11.5–15.5)
WBC: 13.9 10*3/uL — ABNORMAL HIGH (ref 4.0–10.5)
nRBC: 0 % (ref 0.0–0.2)

## 2022-01-04 LAB — GLUCOSE, CAPILLARY
Glucose-Capillary: 153 mg/dL — ABNORMAL HIGH (ref 70–99)
Glucose-Capillary: 165 mg/dL — ABNORMAL HIGH (ref 70–99)
Glucose-Capillary: 172 mg/dL — ABNORMAL HIGH (ref 70–99)
Glucose-Capillary: 173 mg/dL — ABNORMAL HIGH (ref 70–99)
Glucose-Capillary: 177 mg/dL — ABNORMAL HIGH (ref 70–99)

## 2022-01-04 LAB — BASIC METABOLIC PANEL
Anion gap: 9 (ref 5–15)
BUN: 15 mg/dL (ref 8–23)
CO2: 23 mmol/L (ref 22–32)
Calcium: 8.6 mg/dL — ABNORMAL LOW (ref 8.9–10.3)
Chloride: 105 mmol/L (ref 98–111)
Creatinine, Ser: 0.76 mg/dL (ref 0.61–1.24)
GFR, Estimated: >60 mL/min (ref >60)
Glucose, Bld: 152 mg/dL — ABNORMAL HIGH (ref 70–99)
Potassium: 3.5 mmol/L (ref 3.5–5.1)
Sodium: 137 mmol/L (ref 135–145)

## 2022-01-04 LAB — MAGNESIUM: Magnesium: 1.6 mg/dL — ABNORMAL LOW (ref 1.7–2.4)

## 2022-01-04 MED ORDER — MAGNESIUM SULFATE 2 GM/50ML IV SOLN
2.0000 g | Freq: Once | INTRAVENOUS | Status: AC
  Administered 2022-01-04: 2 g via INTRAVENOUS
  Filled 2022-01-04: qty 50

## 2022-01-04 NOTE — Assessment & Plan Note (Addendum)
A1c stable at 6.1,CBG with uncontrolled hyperglycemia in 200s, back on Metformin and continue SSI.Continue statin. ?Recent Labs  ?Lab 01/07/22 ?9038 01/07/22 ?1216 01/07/22 ?1732 01/07/22 ?2143 01/08/22 ?3338  ?GLUCAP 201* 238* 167* 255* 166*  ? ?

## 2022-01-04 NOTE — Progress Notes (Signed)
Physical Therapy Treatment ?Patient Details ?Name: Jordan [name] ?MRN: [id number] ?DOB: 19-Jul-1941 ?Today's Date: 01/04/2022 ? ? ?History of Present Illness 81 y.o. male with medical history significant of hypertension ,noninsulin-dependent type 2 diabetes, CVA, Parkinson's, bradycardia-s/p PPM placement, OSA, narcolepsy, brought to ED by EMS due to due to abdominal pain, he was put on oxygen by EMS due to o2 sats at 21's, wife reports patient has sleep apnea.  He was hospitalized from 2/17 to 2/22 due to hallucination, Zoloft and Klonopin was discontinued, he was started on Seroquel which helped hallucination, he underwent kyphoplasty on 2/28 went home. Now readmitted with stomach pain and dry heave. Dx of sepsis, pancreatitis vs diverticulitis. ? ?  ?PT Comments  ? ? Pt is slowly progressing toward acute PT goals this session with decreased assist +2 required for supine to/from sit and performance of transfers and pre-gait activity today. Pt continues to require +2 assist for safety and performance of all mobility and intermittently increased assist (MOD-MAX) to maintain midline seated balance and when OOB due to heavy Lt lateral lean. Continue to recommend short term rehab stay at SNF upon d/c due to +2 assistance required for all mobility and decreased activity tolerance. Pt will benefit from continued skilled PT to increase their independence and maximize safety with mobility.  ?   ?Recommendations for follow up therapy are one component of a multi-disciplinary discharge planning process, led by the attending physician.  Recommendations may be updated based on patient status, additional functional criteria and insurance authorization. ? ?Follow Up Recommendations ? Skilled nursing-short term rehab (<3 hours/day) ?  ?  ?Assistance Recommended at Discharge Frequent or constant Supervision/Assistance  ?Patient can return home with the following Assistance with cooking/housework;Assist for transportation;Help with  stairs or ramp for entrance;Direct supervision/assist for medications management;Direct supervision/assist for financial management;Two people to help with walking and/or transfers;Two people to help with bathing/dressing/bathroom ?  ?Equipment Recommendations ?    ?  ?Recommendations for Other Services   ? ? ?  ?Precautions / Restrictions Precautions ?Precautions: Fall ?Precaution Comments: monitor vitals ?Restrictions ?Weight Bearing Restrictions: No  ?  ? ?Mobility ? Bed Mobility ?Overal bed mobility: Needs Assistance ?Bed Mobility: Sit to Supine, Supine to Sit ?  ?Sidelying to sit: Mod assist, HOB elevated ?  ?Sit to supine: Max assist, +2 for safety/equipment ?  ?General bed mobility comments: patient was able to complete supine to sit with increased time with HOB rasied.  mod A for maintaining midline seated balance- pt with L lateral leaning. patient needed +2 to transition trunk and BLE into bed at end of session with increased fatigue noted. Able to use LEs to assist with pushing self up in bed while supine. ?  ? ?Transfers ?Overall transfer level: Needs assistance ?Equipment used: Rolling walker (2 wheels) ?Transfers: Sit to/from Stand ?Sit to Stand: Mod assist, +2 safety/equipment, +2 physical assistance, From elevated surface ?  ?  ?  ?  ?  ?General transfer comment: MOD A +2 from slightly elevated EOB with cues for safe hand placement. Increased time to rise. Pt with heavy L lateral lean in standing despite multimodal cues for upright, midline posture requiring facilitation for lateral weight shift toward R. Able to take lateral side steps along EOB with MOD -MAX A+2 due to heavy L lean.  Cues for sequencintg of steps and manual assist for RW management. Pt with increased fatigue following pre-gait activity and requesting to return to EOB. Pt seemingly falling alseep while sitting EOB during seated  rest, easily aroused with audible stimulus. Quickly back to sleep once assisted back to supine. Vitals  sitting EOB after transfer:  109/69mHg, HR 79bpm, 96% O2 sats on RA ?  ? ?Ambulation/Gait ?  ?  ?  ?  ?  ?  ?Pre-gait activities: Lateral weight shifting, standing marches, lateral side stepping along EOB ?  ? ? ?Stairs ?  ?  ?  ?  ?  ? ? ?Wheelchair Mobility ?  ? ?Modified Rankin (Stroke Patients Only) ?  ? ? ?  ?Balance Overall balance assessment: Needs assistance, History of Falls ?Sitting-balance support: Feet supported ?Sitting balance-Leahy Scale: Poor ?Sitting balance - Comments: initially required min/mod A for sitting balance, then fair ?Postural control: Left lateral lean ?  ?Standing balance-Leahy Scale: Poor ?Standing balance comment: heavily reliant on RW and external support from therapists ?  ?  ?  ?  ?  ?  ?  ?  ?  ?  ?  ?  ? ?  ?Cognition Arousal/Alertness: Awake/alert ?Behavior During Therapy: Flat affect ?Overall Cognitive Status: No family/caregiver present to determine baseline cognitive functioning ?  ?  ?  ?  ?  ?  ?  ?  ?  ?  ?  ?  ?  ?  ?  ?  ?General Comments: oriented to place, year month and had two guesses today's date. ?  ?  ? ?  ?Exercises   ? ?  ?General Comments   ?  ?  ? ?Pertinent Vitals/Pain Pain Assessment ?Pain Assessment: No/denies pain ?Pain Score: 0-No pain  ? ? ?Home Living Family/patient expects to be discharged to:: Private residence ?Living Arrangements: Spouse/significant other;Children ?Available Help at Discharge: Family ?Type of Home: Apartment ?Home Access: Elevator ?  ?  ?  ?Home Layout: One level ?Home Equipment: SAdvice worker(2 wheels) ?Additional Comments: no family present during this OT eval and pt able to provide limited PLOF  ?  ?Prior Function    ?  ?  ?   ? ?PT Goals (current goals can now be found in the care plan section) Acute Rehab PT Goals ?Patient Stated Goal: for pt to get stronger ?PT Goal Formulation: Patient unable to participate in goal setting ?Time For Goal Achievement: 01/14/22 ?Potential to Achieve Goals: Fair ?Progress towards  PT goals: Progressing toward goals ? ?  ?Frequency ? ? ? Min 2X/week ? ? ? ?  ?PT Plan Current plan remains appropriate  ? ? ?Co-evaluation PT/OT/SLP Co-Evaluation/Treatment: Yes ?Reason for Co-Treatment: To address functional/ADL transfers;For patient/therapist safety ?PT goals addressed during session: Mobility/safety with mobility ?OT goals addressed during session: ADL's and self-care ?  ? ?  ?AM-PAC PT "6 Clicks" Mobility   ?Outcome Measure ? Help needed turning from your back to your side while in a flat bed without using bedrails?: A Lot ?Help needed moving from lying on your back to sitting on the side of a flat bed without using bedrails?: A Lot ?Help needed moving to and from a bed to a chair (including a wheelchair)?: Total ?Help needed standing up from a chair using your arms (e.g., wheelchair or bedside chair)?: Total ?Help needed to walk in hospital room?: Total ?Help needed climbing 3-5 steps with a railing? : Total ?6 Click Score: 8 ? ?  ?End of Session Equipment Utilized During Treatment: Gait belt ?Activity Tolerance: Patient tolerated treatment well ?Patient left: in bed;with call bell/phone within reach;with bed alarm set ?Nurse Communication: Mobility status ?PT Visit Diagnosis: Other abnormalities of  gait and mobility (R26.89);History of falling (Z91.81);Difficulty in walking, not elsewhere classified (R26.2) ?  ? ? ?Time: 3536-1443 ?PT Time Calculation (min) (ACUTE ONLY): 28 min ? ?Charges:  $Therapeutic Activity: 8-22 mins          ?          ? ?Festus Barren., PT, DPT  ?Acute Rehabilitation Services  ?Office [phone] ? ?01/04/2022, 2:16 PM ? ?

## 2022-01-04 NOTE — Assessment & Plan Note (Addendum)
Leg edema/volume overload ?Cough and wheezing. ?BP is controlled, resp status improving and stable.Continue hydralazine metoprolol.  S/p IV Lasix for edema, echo  W/ EF 60 to 65%, mild concentric LVH, indeterminate LV diastolic function- transitioned to PO lasix. ?

## 2022-01-04 NOTE — Hospital Course (Addendum)
81 yo male with the past medical history of hypertension, T2DM, Parkinson's disease, narcolepsy, OSA and bradycardia sp pacer who presented with abdominal pain. Reported severe abdominal pain, EMS was called and patient was brought to the hospital. Recent hospitalization On 02/17 to 02/22 for hallucinations. ?02/28 had kypho-plasty. On his initial physical examination his blood pressure was 176/84, HR 71, RR 18 to 25, 02 saturation 97% and temp 99,2. His lungs were clear to auscultation, heart with S1 and S2 present and rhythmic, abdomen not distended, no lower extremity edema.  ?Na 137, K 4,9, CL 102, bicarbonate 24, glucose 185, bun 31 and cr 0,97Lactic acid 2,7 Wbc 22.0, hgb 14.7 hvt 43,6 and plt 231 Urine analysis sg 1,026 with negative leukocytes. CT abdomen with new inflammatory change within the pancreatic tail and surrounding mesenteric fat extending to the hepatic flexure of the colon with mild inflammatory changes and mild wall thickening.  ?Jordan [name] was admitted to the hospital with the working diagnosis of pancreatitis, proximal sigmoid diverticulitis, was placed on antibiotic therapy and IV fluids.3/05 developed volume overload and required diuresis with furosemide. Patient found to have metabolic encephalopathy with delirium. Wife is hoping for CIR-PT recommended CIR. Peer to peer review on 3/10 ?

## 2022-01-04 NOTE — Assessment & Plan Note (Addendum)
Improved at baseline.  Continue supportive care. CPAP nightly,if recurrent somnolence may need ABG and Bipap,suspected obesity hypoventilation syndrome. Continue to hold on haloperidol, avoid sedative medications.  ?

## 2022-01-04 NOTE — TOC Progression Note (Signed)
Transition of Care (TOC) - Progression Note  ? ? ?Patient Details  ?Name: Jordan [name] ?MRN: [id number] ?Date of Birth: September 04, 1941 ? ?Transition of Care (TOC) CM/SW Contact  ?Trish Mage, LCSW ?Phone Number: ?01/04/2022, 10:36 AM ? ?Clinical Narrative:   Devin Going at [facility] who confirmed that she had received referral over the weekend.  She wants me to alert her when PT note is in from today so she can see the latest therapy intervention notes.  I called Nikki at [facility] to ask about co-pay at their facility if he is eligible for admission there.  She will follow up today and let me know. TOC will continue to follow during the course of hospitalization. ? ? ? ? ?Expected Discharge Plan: [place] ?Barriers to Discharge: SNF Pending bed offer ? ?Expected Discharge Plan and Services ?Expected Discharge Plan: [place] ?  ?Discharge Planning Services: CM Consult ?Post Acute Care Choice: [place] ?Living arrangements for the past 2 months: Apartment ?                ?  ?  ?  ?  ?  ?  ?  ?  ?  ?  ? ? ?Social Determinants of Health (SDOH) Interventions ?  ? ?Readmission Risk Interventions ?No flowsheet data found. ? ?

## 2022-01-04 NOTE — Assessment & Plan Note (Addendum)
Calculated BMI is 31.6  

## 2022-01-04 NOTE — Assessment & Plan Note (Addendum)
Continue his Sinemet,seroquel melatonin continue OOB PT OT, supportive care. ?

## 2022-01-04 NOTE — Assessment & Plan Note (Addendum)
Continue with Cpap 

## 2022-01-04 NOTE — Progress Notes (Signed)
?   01/04/22 1400  ?Assess: MEWS Score  ?Temp 98.6 ?F (37 ?C)  ?BP (!) 156/94  ?Pulse Rate 79  ?Resp (!) 26  ?Level of Consciousness Alert  ?SpO2 93 %  ?Assess: MEWS Score  ?MEWS Temp 0  ?MEWS Systolic 0  ?MEWS Pulse 0  ?MEWS RR 2  ?MEWS LOC 0  ?MEWS Score 2  ?MEWS Score Color Yellow  ?Assess: if the MEWS score is Yellow or Red  ?Were vital signs taken at a resting state? Yes  ?Focused Assessment Change from prior assessment (see assessment flowsheet)  ?Does the patient meet 2 or more of the SIRS criteria? No  ?MEWS guidelines implemented *See Row Information* Yes  ?Treat  ?MEWS Interventions Escalated (See documentation below)  ?Pain Scale 0-10  ?Pain Score 0  ?Take Vital Signs  ?Increase Vital Sign Frequency  Yellow: Q 2hr X 2 then Q 4hr X 2, if remains yellow, continue Q 4hrs  ?Escalate  ?MEWS: Escalate Yellow: discuss with charge nurse/RN and consider discussing with provider and RRT  ?Notify: Charge Nurse/RN  ?Name of Charge Nurse/RN Notified Cleatrice Burke, RN  ?Date Charge Nurse/RN Notified 01/04/22  ?Time Charge Nurse/RN Notified 1410  ?Notify: Provider  ?Provider Name/Title Dr. Cathlean Sauer  ?Date Provider Notified 01/04/22  ?Time Provider Notified 1359  ?Notification Type Page  ?Notification Reason Change in status;Requested by patient/family  ?Provider response At bedside  ?Date of Provider Response 01/04/22  ?Time of Provider Response 1411  ?Document  ?Patient Outcome Stabilized after interventions  ?Progress note created (see row info) Yes  ?Assess: SIRS CRITERIA  ?SIRS Temperature  0  ?SIRS Pulse 0  ?SIRS Respirations  1  ?SIRS WBC 0  ?SIRS Score Sum  1  ? ?Patient noted to be very drowsy, falling quickly asleep as soon as this nurse left the room.  Patient also noted to be breathing harder.  Wife at bedside.  MD came and assessed patient.  Patient noted to be more alert after that.  Will continue to monitor for any further needed interventions this shift and pass on to oncoming nurse, next shift. ? ?Virginia Rochester, RN ? ?

## 2022-01-04 NOTE — Assessment & Plan Note (Addendum)
With proximal sigmoid diverticulitis ?Sepsis POA: ?Sepsis-resolved,vitals stable. ?He had significant leukocytosis and now down-trending-tolerating diet, cont augmentin to complete course. Cont Miralax,add coalce. ?Recent Labs  ?Lab 01/02/22 ?9150 01/04/22 ?0455 01/06/22 ?5697 01/07/22 ?0502  ?WBC 14.3* 13.9* 14.1* 13.8*  ? ?

## 2022-01-04 NOTE — Assessment & Plan Note (Addendum)
Hypokalemia/ hyponatremia:  ?Improved ?

## 2022-01-04 NOTE — Progress Notes (Signed)
?Progress Note ? ? ?Patient: Jordan [name] JIR:[id number] DOB: 1941-05-27 DOA: 12/30/2021     5 ?DOS: the patient was seen and examined on 01/04/2022 ?  ?Brief hospital course: ?Jordan [name] was admitted to the hospital with the working diagnosis of pancreatitis, proximal sigmoid diverticulitis.  ? ?81 yo male with the past medical history of hypertension, T2DM, Parkinson's disease, narcolepsy, OSA and bradycardia sp pacer who presented with abdominal pain. Reported severe abdominal pain, EMS was called and patient was brought to the hospital.  ?Recent hospitalization On 02/17 to 02/22 for hallucinations. ?02/28 had kypho-plasty. On his initial physical examination his blood pressure was 176/84, HR 71, RR 18 to 25, 02 saturation 97% and temp 99,2. His lungs were clear to auscultation, heart with S1 and S2 present and rhythmic, abdomen not distended, no lower extremity edema.  ? ?Na 137, K 4,9, CL 102, bicarbonate 24, glucose 185, bun 31 and cr 0,97 ?Lactic acid 2,7 ?Wbc 22.0, hgb 14.7 hvt 43,6 and plt 231 ? ?Urine analysis sg 1,026 with negative leukocytes.  ? ?CT abdomen with new inflammatory change within the pancreatic tail and surrounding mesenteric fat extending to the hepatic flexure of the colon with mild inflammatory changes and mild wall thickening.  ? ?Patient was placed on antibiotic therapy and IV fluids.  ? ?03/05 developed volume overload and required diuresis with furosemide.  ?Patient found to have metabolic encephalopathy with delirium.  ? ?Physical therapy has recommended transfer to SNF when medically stable.  ? ?Assessment and Plan: ?* Pancreatitis ?Positive sepsis on admission.  ? ?Patient today with improvement in abdominal pain, he had poor po intake due to somnolence but not because abdominal pain.  ?No nausea or vomiting.  ?Wbc is 13,9, cultures continue with no growth.  ? ?Plan to continue supportive medical care and advance diet as tolerated.  ?Inflammation of the pancreas also producing bowel  inflammation vs diverticulitis.  ? ?Continue antibiotic therapy with ceftriaxone and metronidazole.  ?Holding IV fluids due to volume overload.  ? ? ?Acute metabolic encephalopathy ?Patient this am somnolent but improved mentation this PM.  ?He has been using Cpap. ? ?Continue neuro checks per unit protocol. ?Discontinue haloperidol.  ? ?Essential hypertension ?Continue blood pressure control with metoprolol.  ?Hold on furosemide for now. ?Hydralazine 10 mg po bid.  ?Continue blood pressure monitoring.  ? 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 ?Possible CO2 retention, continue close monitoring. ?If recurrent somnolence, plan to check ABG and possible Bipap.  ? ?Type 2 diabetes mellitus with hyperlipidemia ([place]) ?Continue glucose cover and monitoring with insulin sliding scale. ?Patient with no nausea or vomiting.  ?Capillary glucose has remained stable  ? ?Continue with statin therapy.  ? ?Parkinson's disease ([place]) ?Continue medical therapy with sinemet.  ?Out of bed to chair tid with meals, continue with PT and OT.  ? ?Continue with quetiapine and melatonin at night.  ? ?Hypomagnesemia ?Hypokalemia  ?Renal function stable, improved volume status after diuresis. ?Follow up Na is 137, K 3,5 and serum bicarbonate 23, Mg is 1,6 ? ?Add 2 g Mag sulfate and follow up electrolytes in am. ?Hold on furosemide.  ? ?Class 1 obesity ?Calculated BMI is 31,6  ? ? ? ? ?  ? ?Subjective: patient has been somnolent through the morning but more awake at the time of my examination  ? ?Physical Exam: ?Vitals:  ? 01/04/22 0430 01/04/22 1259 01/04/22 1400 01/04/22 1548  ?BP: (!) 159/86 (!) 147/91 (!) 156/94 (!) 153/96  ?Pulse: 67 78 79  75  ?Resp: (!) 22 20 (!) 26 (!) 26  ?Temp: 98.8 ?F (37.1 ?C) 98.6 ?F (37 ?C) 98.6 ?F (37 ?C) 98.4 ?F (36.9 ?C)  ?TempSrc: Oral Axillary Axillary   ?SpO2: 92% 93% 93% 93%  ?Weight:      ?Height:      ? ?Neurology awake and alert, no confusion or agitation, non focal ?ENT with no pallor or  icterus ?Cardiovascular with S1 and S2 present and rhythmic with no gallops or rubs ?No JVD ?Trace lower extremity edema + bilaterally  ?Abdomen protuberant but not tender or distended  ?Data Reviewed: ? ? ? ?Family Communication: I spoke with patient's wife at the bedside, we talked in detail about patient's condition, plan of care and prognosis and all questions were addressed. ? ? ?Disposition: ?Status is: Inpatient ?Remains inpatient appropriate because: medical therapy  ? Planned Discharge Destination:  to be determined, his wife would like him to go to CIR  ? ? ? ?Author: ?Tawni Millers, MD ?01/04/2022 4:19 PM ? ?For on call review [email or web address].  ?

## 2022-01-04 NOTE — Evaluation (Addendum)
Occupational Therapy Evaluation Patient Details Name: Jordan [name] MRN: [id number] DOB: [birth year] Today's Date: 01/04/2022   History of Present Illness 81 y.o. male with medical history significant of hypertension ,noninsulin-dependent type 2 diabetes, CVA, Parkinson's, bradycardia-s/p PPM placement, OSA, narcolepsy, brought to ED by EMS due to due to abdominal pain, he was put on oxygen by EMS due to o2 sats at 36's, wife reports patient has sleep apnea.  He was hospitalized from 2/17 to 2/22 due to hallucination, Zoloft and Klonopin was discontinued, he was started on Seroquel which helped hallucination, he underwent kyphoplasty on 2/28 went home. Now readmitted with stomach pain and dry heave. Dx of sepsis, pancreatitis vs diverticulitis.   Clinical Impression   Patient is a 81 year old male who was admitted for above.patient is +2 for all movement with increased fatigue noted as treatment progressed.  Patient was noted to have decreased functional activity tolerance, decreased endurance, decreased sitting/standing balance, decreased trunk control, decreased safety awareness and decreased strength impacting participation in ADL tasks. Patient would need 24/7 physical support +2 to be successful in next level of care. Patient would continue to benefit from skilled OT services at this time while admitted and after d/c to address noted deficits in order to improve overall safety and independence in ADLs.       Recommendations for follow up therapy are one component of a multi-disciplinary discharge planning process, led by the attending physician.  Recommendations may be updated based on patient status, additional functional criteria and insurance authorization.   Follow Up Recommendations  Skilled nursing-short term rehab (<3 hours/day)    Assistance Recommended at Discharge Frequent or constant Supervision/Assistance  Patient can return home with the following A lot of help with walking  and/or transfers;A lot of help with bathing/dressing/bathroom;Direct supervision/assist for medications management;Help with stairs or ramp for entrance;Assist for transportation    Functional Status Assessment  Patient has had a recent decline in their functional status and demonstrates the ability to make significant improvements in function in a reasonable and predictable amount of time.  Equipment Recommendations       Recommendations for Other Services       Precautions / Restrictions Precautions Precautions: Fall Precaution Comments: monitor vitals Restrictions Weight Bearing Restrictions: No      Mobility Bed Mobility Overal bed mobility: Needs Assistance Bed Mobility: Sit to Supine, Supine to Sit   Sidelying to sit: Mod assist, HOB elevated   Sit to supine: Max assist, +2 for safety/equipment   General bed mobility comments: patient was able to complete supine to sit with increased time with HOB rasied and mod A with leaning noted to L. patient needed +2 to transition trunk and BLE into bed at end of session with increased fatigue noted.    Transfers                          Balance Overall balance assessment: Needs assistance, History of Falls Sitting-balance support: Feet supported Sitting balance-Leahy Scale: Poor   Postural control: Left lateral lean                                 ADL either performed or assessed with clinical judgement   ADL Overall ADL's : Needs assistance/impaired Eating/Feeding: Minimal assistance;Bed level Eating/Feeding Details (indicate cue type and reason): increased time to process bringing cup to mouth of thickned liquids with cues for  small sips as per SLP recommendations Grooming: Sitting;Maximal assistance Grooming Details (indicate cue type and reason): with strong lean to the L washing face adn attempting to comb hair sitting on edge of bed. Upper Body Bathing: Maximal assistance;Bed level   Lower  Body Bathing: Total assistance;Bed level   Upper Body Dressing : Maximal assistance;Bed level   Lower Body Dressing: Bed level;Total assistance   Toilet Transfer: +2 for physical assistance;+2 for safety/equipment;Maximal assistance Toilet Transfer Details (indicate cue type and reason): attempted standing to take steps with patient noted to need TD phydical support to msintain upright posture strong lean to L Toileting- Clothing Manipulation and Hygiene: Total assistance;Bed level       Functional mobility during ADLs: +2 for physical assistance;Total assistance;+2 for safety/equipment       Vision Baseline Vision/History: 1 Wears glasses Patient Visual Report: No change from baseline Vision Assessment?: No apparent visual deficits     Perception     Praxis      Pertinent Vitals/Pain Pain Assessment Pain Assessment: Faces Faces Pain Scale: No hurt     Hand Dominance Right   Extremity/Trunk Assessment Upper Extremity Assessment RUE Deficits / Details: limited over head ROM, poor dexterity/fine motor, globally weak with 4/5 MMT RUE Coordination: decreased fine motor           Communication Communication Communication: Expressive difficulties   Cognition Arousal/Alertness: Awake/alert Behavior During Therapy: Flat affect Overall Cognitive Status: No family/caregiver present to determine baseline cognitive functioning                                 General Comments: oriented to place, year month and had two guesses on date today.     General Comments       Exercises     Shoulder Instructions      Home Living Family/patient expects to be discharged to:: Private residence Living Arrangements: Spouse/significant other;Children Available Help at Discharge: Family Type of Home: Apartment Home Access: Elevator     Home Layout: One level     Bathroom Shower/Tub: Tub/shower unit;Walk-in shower   Bathroom Toilet: Handicapped height      Home Equipment: Advice worker (2 wheels)   Additional Comments: no family present during this OT eval and pt able to provide limited PLOF      Prior Functioning/Environment Prior Level of Function : Needs assist       Physical Assist : Mobility (physical);ADLs (physical) Mobility (physical): Transfers;Gait ADLs (physical): Bathing;Dressing;Toileting;IADLs   ADLs Comments: Wife providing assist for all tasks per pt report set up for grooming tasks and self feeding.TD for LB dressibng/bathing tasks        OT Problem List: Decreased strength;Decreased range of motion;Decreased activity tolerance;Impaired balance (sitting and/or standing);Decreased coordination;Decreased cognition;Decreased safety awareness;Decreased knowledge of precautions      OT Treatment/Interventions: Self-care/ADL training;Therapeutic exercise;DME and/or AE instruction;Patient/family education;Balance training;Therapeutic activities    OT Goals(Current goals can be found in the care plan section) Acute Rehab OT Goals Patient Stated Goal: home OT Goal Formulation: Patient unable to participate in goal setting Time For Goal Achievement: 01/18/22 Potential to Achieve Goals: Good  OT Frequency: Min 2X/week    Co-evaluation PT/OT/SLP Co-Evaluation/Treatment: Yes Reason for Co-Treatment: For patient/therapist safety;To address functional/ADL transfers PT goals addressed during session: Mobility/safety with mobility OT goals addressed during session: ADL's and self-care      AM-PAC OT "6 Clicks" Daily Activity     Outcome Measure  Help from another person eating meals?: A Little Help from another person taking care of personal grooming?: A Little Help from another person toileting, which includes using toliet, bedpan, or urinal?: A Lot Help from another person bathing (including washing, rinsing, drying)?: A Lot Help from another person to put on and taking off regular upper body clothing?: A  Little Help from another person to put on and taking off regular lower body clothing?: A Lot 6 Click Score: 15   End of Session Equipment Utilized During Treatment: Gait belt;Rolling walker (2 wheels) Nurse Communication: Mobility status  Activity Tolerance: Patient limited by fatigue Patient left: in bed;with call bell/phone within reach;with bed alarm set  OT Visit Diagnosis: Unsteadiness on feet (R26.81);Other abnormalities of gait and mobility (R26.89);Muscle weakness (generalized) (M62.81);History of falling (Z91.81)                Time: 6712-4580 OT Time Calculation (min): 29 min Charges:  OT General Charges $OT Visit: 1 Visit OT Evaluation $OT Eval Moderate Complexity: 1 Mod  Jackelyn Poling OTR/L, MS Acute Rehabilitation Department Office# [phone] Pager# [phone]   Marcellina Millin 01/04/2022, 12:41 PM

## 2022-01-04 NOTE — Progress Notes (Signed)
Modified Barium Swallow Progress Note ? ?Patient Details  ?Name: Jordan [name] ?MRN: [id number] ?Date of Birth: [birth year] ? ?Today's Date: 01/04/2022 ? ?Modified Barium Swallow completed.  Full report located under Chart Review in the Imaging Section. ? ?Brief recommendations include the following: ? ?Clinical Impression ? Patient presents with a mild-moderate oral and a mild pharyngeal phase dysphagia. During oral phase, patient with decreased bolus cohesion with barium tablet in puree solids, requiring thin liquid barium sip to transit. He exhibited a munch chewing pattern with graham cracker. He did exhibit decreased anterior to posterior movement of liquids as well but with good oral containment of bolus and no premature spillage in vallecular sinus observed. During pharyngeal phase of swallow, patient exhibited a couple instances of mildly delayed swallow initiation at level of vallecular sinus and trace amount of vallecular residuals observed post initial swallows with thin liquids. No penetration or aspiration observed and cricopharyngeal phase of swallow appeared [facility] with no backflow of barium. Esophageal sweep did not reveal any significant s/s of dysmotility or stasis of barium. Pharyngeal phase of swallow was [facility] for puree solids, barium tablet in puree and regular texture solids. SLP is recommending to upgrade liquids to thin and continue with regular solids. ?  ?Swallow Evaluation Recommendations ? ?   ? ? SLP Diet Recommendations: Regular solids;Thin liquid ? ? Liquid Administration via: Cup;Straw ? ? Medication Administration: Whole meds with liquid ? ? Supervision: Full supervision/cueing for compensatory strategies;Staff to assist with self feeding ? ? Compensations: Slow rate;Small sips/bites ? ? Postural Changes: Seated upright at 90 degrees ? ? Oral Care Recommendations: Oral care BID;Staff/trained caregiver to provide oral care ? ?   ? ? ? ?Sonia Baller, MA, CCC-SLP ?Speech Therapy ? ?

## 2022-01-05 LAB — GLUCOSE, CAPILLARY
Glucose-Capillary: 157 mg/dL — ABNORMAL HIGH (ref 70–99)
Glucose-Capillary: 179 mg/dL — ABNORMAL HIGH (ref 70–99)
Glucose-Capillary: 185 mg/dL — ABNORMAL HIGH (ref 70–99)
Glucose-Capillary: 183 mg/dL — ABNORMAL HIGH (ref 70–99)

## 2022-01-05 LAB — BASIC METABOLIC PANEL
Anion gap: 8 (ref 5–15)
BUN: 17 mg/dL (ref 8–23)
CO2: 23 mmol/L (ref 22–32)
Calcium: 8.5 mg/dL — ABNORMAL LOW (ref 8.9–10.3)
Chloride: 103 mmol/L (ref 98–111)
Creatinine, Ser: 0.68 mg/dL (ref 0.61–1.24)
GFR, Estimated: >60 mL/min (ref >60)
Glucose, Bld: 146 mg/dL — ABNORMAL HIGH (ref 70–99)
Potassium: 3.8 mmol/L (ref 3.5–5.1)
Sodium: 134 mmol/L — ABNORMAL LOW (ref 135–145)

## 2022-01-05 MED ORDER — AMOXICILLIN-POT CLAVULANATE 875-125 MG PO TABS
1.0000 | ORAL_TABLET | Freq: Two times a day (BID) | ORAL | Status: DC
  Administered 2022-01-05 – 2022-01-08 (×6): 1 via ORAL
  Filled 2022-01-05 (×6): qty 1

## 2022-01-05 MED ORDER — FUROSEMIDE 10 MG/ML IJ SOLN
40.0000 mg | Freq: Two times a day (BID) | INTRAMUSCULAR | Status: DC
  Administered 2022-01-05 – 2022-01-06 (×2): 40 mg via INTRAVENOUS
  Filled 2022-01-05 (×2): qty 4

## 2022-01-05 NOTE — TOC Progression Note (Signed)
Transition of Care (TOC) - Progression Note  ? ? ?Patient Details  ?Name: Jordan [name] ?MRN: [id number] ?Date of Birth: 1941-09-01 ? ?Transition of Care (TOC) CM/SW Contact  ?Trish Mage, LCSW ?Phone Number: ?01/05/2022, 11:36 AM ? ?Clinical Narrative:   [facility] declined patient.  Ms Tousley asked that I check with [facility] as he had been there before.  [facility] has no openings this week.  Spoke with wife who selects [facility].  I spoke with [place] and asked her to initiate insurance authorization request. TOC will continue to follow during the course of hospitalization. ? ? ? ? ?Expected Discharge Plan: [place] ?Barriers to Discharge: Other (must enter comment) [employer] authorization) ? ?Expected Discharge Plan and Services ?Expected Discharge Plan: [place] ?  ?Discharge Planning Services: CM Consult ?Post Acute Care Choice: [place] ?Living arrangements for the past 2 months: Apartment ?                ?  ?  ?  ?  ?  ?  ?  ?  ?  ?  ? ? ?Social Determinants of Health (SDOH) Interventions ?  ? ?Readmission Risk Interventions ?No flowsheet data found. ? ?

## 2022-01-05 NOTE — Progress Notes (Signed)
RT NOTE: ? ?RT paged by RN to come look at pt CPAP machine. Pt states he feels like his machine is not giving him enough pressure. The CPAP machine is the pt's home equipment, it is a [employer] 11. Per RT protocol we are unable to change pt home CPAP settings, only home health company can. Pt provided with the company name and number and encouraged to reach out to them regarding his CPAP pressure setting. RN is aware.  ?

## 2022-01-05 NOTE — Progress Notes (Signed)
?Progress Note ? ? ?Patient: Jordan [name] XTA:[id number] DOB: Oct 05, 1941 DOA: 12/30/2021     6 ?DOS: the patient was seen and examined on 01/05/2022 ?  ?Brief hospital course: ?Jordan [name] was admitted to the hospital with the working diagnosis of pancreatitis, proximal sigmoid diverticulitis.  ? ?81 yo male with the past medical history of hypertension, T2DM, Parkinson's disease, narcolepsy, OSA and bradycardia sp pacer who presented with abdominal pain. Reported severe abdominal pain, EMS was called and patient was brought to the hospital.  ?Recent hospitalization On 02/17 to 02/22 for hallucinations. ?02/28 had kypho-plasty. On his initial physical examination his blood pressure was 176/84, HR 71, RR 18 to 25, 02 saturation 97% and temp 99,2. His lungs were clear to auscultation, heart with S1 and S2 present and rhythmic, abdomen not distended, no lower extremity edema.  ? ?Na 137, K 4,9, CL 102, bicarbonate 24, glucose 185, bun 31 and cr 0,97 ?Lactic acid 2,7 ?Wbc 22.0, hgb 14.7 hvt 43,6 and plt 231 ? ?Urine analysis sg 1,026 with negative leukocytes.  ? ?CT abdomen with new inflammatory change within the pancreatic tail and surrounding mesenteric fat extending to the hepatic flexure of the colon with mild inflammatory changes and mild wall thickening.  ? ?Patient was placed on antibiotic therapy and IV fluids.  ? ?03/05 developed volume overload and required diuresis with furosemide.  ?Patient found to have metabolic encephalopathy with delirium.  ? ?Physical therapy has recommended transfer to SNF when medically stable.  ?His wife is hoping to transfer patient to CIR.  ? ?Assessment and Plan: ?* Pancreatitis ?Positive sepsis on admission.  ?Inflammation of the pancreas also producing bowel inflammation vs diverticulitis.  ? ?Today with no abdominal pain and tolerating po well.   ?His leukocytosis has been improving.  ? ?Plan to transition to oral antibiotic therapy with Augmentin and follow up cell count in am.   ?Out of bed to chair tid with meals. ?Continue pain control with acetaminophen.  ? ?Acute metabolic encephalopathy ?Mentation has improved today. ?Continue to use Cpap, if recurrent somnolence may need ABG and Bipap, suspected obesity hypoventilation syndrome.  ? ?Continue neuro checks per unit protocol. ?Continue to hold on haloperidol, avoid sedative medications.  ? ?Essential hypertension ?Blood pressure had improved today 655 mmHg systolic, plan to continue with hydralazine and metoprolol.  ? ?Patient continue to have edema and wheezing, his last echocardiogram from is from 04/2020 with preserved LV EF 65 to 70%, preserved RV systolic function, RVSP 37,4 with no significant valvular disease.  ? ?Plan to resume diuresis with furosemide and check follow up echocardiogram.  ? 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 ? ? ?Type 2 diabetes mellitus with hyperlipidemia ([place]) ?Continue glucose cover and monitoring with insulin sliding scale. ?Fasting glucose today is 146 mg/dl ?Continue with statin therapy.  ? ?Parkinson's disease ([place]) ?Continue medical therapy with sinemet.  ?Out of bed to chair tid with meals, continue with PT and OT.  ? ?Continue with quetiapine and melatonin at night.  ?His wife is hoping to transfer patient to inpatient rehab.  ? ?Hypomagnesemia ?Hypokalemia/ hyponatremia.  ?Stable renal function with serum cr at 0,68, K is 3,8 and Na 134. ?Clinically continue with hypervolemia. ? ?Plan to resume diuresis with IV furosemide, follow up electrolytes in am.  ? ?Class 1 obesity ?Calculated BMI is 31,6  ? ? ?  ? ?Subjective: Patient is more awake and alert, no chest pain, positive wheezing.  ? ?Physical Exam: ?Vitals:  ? 01/04/22 2223 01/05/22  4585 01/05/22 0404 01/05/22 1226  ?BP: (!) 174/96 (!) 159/89 (!) 160/99 126/71  ?Pulse: 80 75 70 78  ?Resp: '20 20 18 20  '$ ?Temp: 99.7 ?F (37.6 ?C) 98.5 ?F (36.9 ?C) 98.9 ?F (37.2 ?C) 99.1 ?F (37.3 ?C)  ?TempSrc: Oral Oral Oral Oral  ?SpO2: 93% 93% 90% 92%   ?Weight:      ?Height:      ? ?Neurology awake and alert ?ENT with no pallor or icterus ?Cardiovascular with S1 and S2 present and rhythmic, with no gallops, positive systolic murmur at the apex, ?No JVD (wide neck) ?Positive lower extremity edema pitting ++ bilaterally ?Respiratory with positive diffuse bilateral wheezing ?Abdomen soft and non tender, protuberant.  ?Data Reviewed: ? ? ? ?Family Communication: no family at the bedside. ?I spoke over the phone with the patient's son about patient's  condition, plan of care, prognosis and all questions were addressed.  ? ?Disposition: ?Status is: Inpatient ?Remains inpatient appropriate because: IV diuresis, possible dc in the next 24 to 48 hrs,  ? Planned Discharge Destination: Skilled nursing facility ? ? ? ?Author: ?Tawni Millers, MD ?01/05/2022 2:45 PM ? ?For on call review [email or web address].  ?

## 2022-01-06 ENCOUNTER — Inpatient Hospital Stay (HOSPITAL_COMMUNITY): Payer: Medicare (Managed Care)

## 2022-01-06 DIAGNOSIS — R0609 Other forms of dyspnea: Secondary | ICD-10-CM

## 2022-01-06 LAB — BASIC METABOLIC PANEL
Anion gap: 10 (ref 5–15)
BUN: 18 mg/dL (ref 8–23)
CO2: 25 mmol/L (ref 22–32)
Calcium: 9.1 mg/dL (ref 8.9–10.3)
Chloride: 102 mmol/L (ref 98–111)
Creatinine, Ser: 0.76 mg/dL (ref 0.61–1.24)
GFR, Estimated: >60 mL/min (ref >60)
Glucose, Bld: 147 mg/dL — ABNORMAL HIGH (ref 70–99)
Potassium: 3.7 mmol/L (ref 3.5–5.1)
Sodium: 137 mmol/L (ref 135–145)

## 2022-01-06 LAB — GLUCOSE, CAPILLARY
Glucose-Capillary: 141 mg/dL — ABNORMAL HIGH (ref 70–99)
Glucose-Capillary: 182 mg/dL — ABNORMAL HIGH (ref 70–99)
Glucose-Capillary: 199 mg/dL — ABNORMAL HIGH (ref 70–99)
Glucose-Capillary: 213 mg/dL — ABNORMAL HIGH (ref 70–99)

## 2022-01-06 LAB — ECHOCARDIOGRAM COMPLETE
AR max vel: 2.56 cm2
AV Area VTI: 2.59 cm2
AV Area mean vel: 2.42 cm2
AV Mean grad: 7 mmHg
AV Peak grad: 13.3 mmHg
Ao pk vel: 1.83 m/s
Area-P 1/2: 2.01 cm2
Height: 68 in
S' Lateral: 2.6 cm
Weight: 3333.36 oz

## 2022-01-06 LAB — CBC
HCT: 41 % (ref 39.0–52.0)
Hemoglobin: 13.5 g/dL (ref 13.0–17.0)
MCH: 29.5 pg (ref 26.0–34.0)
MCHC: 32.9 g/dL (ref 30.0–36.0)
MCV: 89.5 fL (ref 80.0–100.0)
Platelets: 306 10*3/uL (ref 150–400)
RBC: 4.58 MIL/uL (ref 4.22–5.81)
RDW: 13.2 % (ref 11.5–15.5)
WBC: 14.1 10*3/uL — ABNORMAL HIGH (ref 4.0–10.5)
nRBC: 0 % (ref 0.0–0.2)

## 2022-01-06 LAB — MAGNESIUM: Magnesium: 1.7 mg/dL (ref 1.7–2.4)

## 2022-01-06 MED ORDER — FUROSEMIDE 10 MG/ML IJ SOLN
40.0000 mg | Freq: Every day | INTRAMUSCULAR | Status: DC
  Administered 2022-01-07: 11:00:00 40 mg via INTRAVENOUS
  Filled 2022-01-06: qty 4

## 2022-01-06 MED ORDER — ENOXAPARIN SODIUM 40 MG/0.4ML IJ SOSY
40.0000 mg | PREFILLED_SYRINGE | INTRAMUSCULAR | Status: DC
  Administered 2022-01-06 – 2022-01-07 (×2): 40 mg via SUBCUTANEOUS
  Filled 2022-01-06 (×2): qty 0.4

## 2022-01-06 NOTE — Progress Notes (Signed)
Pharmacy Brief Note: ? ?Pharmacy consulted to order enoxaparin for DVT ppx.  ? ?Assessment: ?-TBW 94 kg ?-SCr 0.76, CrCl ~80 mL/min ?-CBC WNL ? ?Plan: ?Enoxaparin 40 mg subcutaneously daily at bedtime ? ?Pharmacy to sign off.  ? ?Lenis Noon, PharmD ?01/06/22 ?5:12 PM ?

## 2022-01-06 NOTE — Progress Notes (Signed)
Physical Therapy Treatment ?Patient Details ?Name: Jordan [name] ?MRN: [id number] ?DOB: [birth year] ?Today's Date: 01/06/2022 ? ? ?History of Present Illness 81 y.o. male with medical history significant of hypertension ,noninsulin-dependent type 2 diabetes, CVA, Parkinson's, bradycardia-s/p PPM placement, OSA, narcolepsy, brought to ED by EMS due to due to abdominal pain, he was put on oxygen by EMS due to o2 sats at 66's, wife reports patient has sleep apnea.  He was hospitalized from 2/17 to 2/22 due to hallucination, Zoloft and Klonopin was discontinued, he was started on Seroquel which helped hallucination, he underwent kyphoplasty on 2/28 went home. Now readmitted with stomach pain and dry heave. Dx of sepsis, pancreatitis vs diverticulitis. ? ?  ?PT Comments  ? ? Pt made good progress with mobility today. Pt completed LE there ex and was able to tolerate sitting EOB and transferring to the recliner. I did notice coughing and pt required rest breaks to slow respirations. Pt stood with mod assist with RW and completed a stand pivot transfer to the chair. Pt will continue to benefit from acute skilled PT to maximize mobility and independence for the next venue of care.  ?Recommendations for follow up therapy are one component of a multi-disciplinary discharge planning process, led by the attending physician.  Recommendations may be updated based on patient status, additional functional criteria and insurance authorization. ? ?Follow Up Recommendations ? Skilled nursing-short term rehab (<3 hours/day) ?  ?  ?Assistance Recommended at Discharge Frequent or constant Supervision/Assistance  ?Patient can return home with the following Assist for transportation;Help with stairs or ramp for entrance;Assistance with cooking/housework;A lot of help with walking and/or transfers;A lot of help with bathing/dressing/bathroom ?  ?Equipment Recommendations ? Rolling walker (2 wheels)  ?  ?Recommendations for Other Services    ? ? ?  ?Precautions / Restrictions Precautions ?Precautions: Fall ?Restrictions ?Weight Bearing Restrictions: No  ?  ? ?Mobility ? Bed Mobility ?Overal bed mobility: Needs Assistance ?Bed Mobility: Supine to Sit ?Rolling: Min guard ?Sidelying to sit: Min guard, HOB elevated ?  ?  ?  ?General bed mobility comments: Pt used bed rails to assist with roll. Pt able to sit EOB with min guard assist and verbal cues for technique. Pt did not present with left lateral lean today. ?  ? ?Transfers ?Overall transfer level: Needs assistance ?Equipment used: Rolling walker (2 wheels) ?Transfers: Sit to/from Stand ?Sit to Stand: Min assist, From elevated surface ?Stand pivot transfers: Min assist, From elevated surface ?  ?  ?  ?  ?General transfer comment: cues for hand placement and technique, able to stand more uproght and did not demonstrate lateral lean. Increased effort to step and pivot towards recliner. cues for hand placment to control descent to the recliner. ?  ? ?Ambulation/Gait ?  ?  ?  ?  ?  ?  ?  ?  ? ? ?Stairs ?  ?  ?  ?  ?  ? ? ?Wheelchair Mobility ?  ? ?Modified Rankin (Stroke Patients Only) ?  ? ? ?  ?Balance Overall balance assessment: Needs assistance, History of Falls ?Sitting-balance support: No upper extremity supported, Feet supported ?Sitting balance-Leahy Scale: Good ?  ?  ?Standing balance support: Bilateral upper extremity supported, During functional activity ?Standing balance-Leahy Scale: Fair ?  ?  ?  ?  ?  ?  ?  ?  ?  ?  ?  ?  ?  ? ?  ?Cognition Arousal/Alertness: Awake/alert ?Behavior During Therapy: [facility] for tasks assessed/performed ?Overall  Cognitive Status: No family/caregiver present to determine baseline cognitive functioning ?  ?  ?  ?  ?  ?  ?  ?  ?  ?  ?  ?  ?  ?  ?  ?  ?  ?  ?  ? ?  ?Exercises General Exercises - Lower Extremity ?Ankle Circles/Pumps: AROM, Strengthening, Both, 10 reps, Supine ?Quad Sets: AROM, Strengthening, Both, 10 reps, Seated ?Long Arc Quad: AROM, Strengthening, Both,  10 reps, Seated ?Heel Slides: AROM, Strengthening, Both, 10 reps, Supine ?Hip ABduction/ADduction: AROM, Strengthening, Both, 10 reps, Supine ?Straight Leg Raises: AROM, Strengthening, Both, 10 reps, Supine ? ?  ?General Comments   ?  ?  ? ?Pertinent Vitals/Pain Pain Assessment ?Pain Assessment: No/denies pain ?Pain Score: 0-No pain ?Pain Intervention(s): Monitored during session  ? ? ?Home Living   ?  ?  ?  ?  ?  ?  ?  ?  ?  ?   ?  ?Prior Function    ?  ?  ?   ? ?PT Goals (current goals can now be found in the care plan section) Progress towards PT goals: Progressing toward goals ? ?  ?Frequency ? ? ? Min 2X/week ? ? ? ?  ?PT Plan Current plan remains appropriate  ? ? ?Co-evaluation   ?  ?  ?  ?  ? ?  ?AM-PAC PT "6 Clicks" Mobility   ?Outcome Measure ? Help needed turning from your back to your side while in a flat bed without using bedrails?: A Little ?Help needed moving from lying on your back to sitting on the side of a flat bed without using bedrails?: A Little ?Help needed moving to and from a bed to a chair (including a wheelchair)?: A Little ?Help needed standing up from a chair using your arms (e.g., wheelchair or bedside chair)?: A Little ?Help needed to walk in hospital room?: A Lot ?Help needed climbing 3-5 steps with a railing? : Total ?6 Click Score: 15 ? ?  ?End of Session Equipment Utilized During Treatment: Gait belt ?Activity Tolerance: Patient tolerated treatment well ?Patient left: in chair;with call bell/phone within reach;with chair alarm set ?Nurse Communication: Mobility status ?PT Visit Diagnosis: Other abnormalities of gait and mobility (R26.89);History of falling (Z91.81);Difficulty in walking, not elsewhere classified (R26.2) ?  ? ? ?Time: 1131-1202 ?PT Time Calculation (min) (ACUTE ONLY): 31 min ? ?Charges:  $Therapeutic Exercise: 8-22 mins ?$Therapeutic Activity: 8-22 mins          ?          ? ? ? ?Lelon Mast ?01/06/2022, 12:26 PM ? ?

## 2022-01-06 NOTE — Progress Notes (Signed)
Echocardiogram ?2D Echocardiogram has been performed. ? ?Arlyss Gandy ?01/06/2022, 3:46 PM ?

## 2022-01-06 NOTE — Progress Notes (Signed)
Spoke with patients wife regarding d/c plan. She continues to request inpatient rehab. Pt did demonstrate improved mobility today and tolerated a 30 min session. I do feel with his improved status he should be able to tolerate the 3 hours of therapy required. Recommend CIR consult to assess eligibility. PT to continue to monitor d/c needs. ?

## 2022-01-06 NOTE — Progress Notes (Signed)
PROGRESS NOTE DERREON CONSALVO  EUM:[id number] DOB: [birth year] DOA: 12/30/2021 PCP: [name], MD   Brief Narrative/Hospital Course: 81 yo male with the past medical history of hypertension, T2DM, Parkinson's disease, narcolepsy, OSA and bradycardia sp pacer who presented with abdominal pain. Reported severe abdominal pain, EMS was called and patient was brought to the hospital. Recent hospitalization On 02/17 to 02/22 for hallucinations. 02/28 had kypho-plasty. On his initial physical examination his blood pressure was 176/84, HR 71, RR 18 to 25, 02 saturation 97% and temp 99,2. His lungs were clear to auscultation, heart with S1 and S2 present and rhythmic, abdomen not distended, no lower extremity edema.  Na 137, K 4,9, CL 102, bicarbonate 24, glucose 185, bun 31 and cr 0,97Lactic acid 2,7 Wbc 22.0, hgb 14.7 hvt 43,6 and plt 231 Urine analysis sg 1,026 with negative leukocytes. CT abdomen with new inflammatory change within the pancreatic tail and surrounding mesenteric fat extending to the hepatic flexure of the colon with mild inflammatory changes and mild wall thickening.  Mr. Dyas was admitted to the hospital with the working diagnosis of pancreatitis, proximal sigmoid diverticulitis, was placed on antibiotic therapy and IV fluids.3/05 developed volume overload and required diuresis with furosemide. Patient found to have metabolic encephalopathy with delirium. Wife is hoping for CIR-PT suggesting a skilled nursing facility.    Subjective: Seen and examined this morning.  Complains of some cough no BM for 2 days.  No new complaints. Doing well on room air blood pressure was slightly higher this morning. Assessment and Plan: * Pancreatitis Proximal sigmoid diverticulitis Sepsis poa: Patient treated with sepsis pancreatitis diverticulitis, sepsis facility has resolved.  Has had significant leukocytosis and now downtrending.  Currently afebrile blood pressure on higher side.  Denies any  abdominal pain today.  Continue diet, continue Augmentin to complete the course.  Monitor CBC count Recent Labs  Lab 12/30/21 1254 12/31/21 0515 01/01/22 0531 01/01/22 0543 01/02/22 0511 01/04/22 0455 01/06/22 0527  WBC  --  22.9*  --  19.1* 14.3* 13.9* 14.1*  LATICACIDVEN 2.6* 2.4* 1.0  --   --   --   --     Acute metabolic encephalopathy Mentation appears to have improved.  Continue supportive care. CPAP nightly,if recurrent somnolence may need ABG and Bipap,suspected obesity hypoventilation syndrome. Continue to hold on haloperidol, avoid sedative medications.   Essential hypertension Leg edema/volume overload Cough and wheezing: BP is controlled continue hydralazine metoprolol. He has been having edema and wheezing, his last echocardiogram from is from 04/2020 with preserved LV EF 65 to 70%, preserved RV systolic function, RVSP 54,0 with no significant valvular disease.  On IV Lasix transition to p.o. tomorrow.  repeat Echo is pending  OSA on CPAP/narcolepsy  Continue with Cpap.   Type 2 diabetes mellitus with hyperlipidemia (HCC) A1c stable at 6.1, CBG controlled.  Continue SSI, continue statin. Recent Labs  Lab 12/31/21 0515 12/31/21 0810 01/05/22 0744 01/05/22 1214 01/05/22 1655 01/05/22 2112 01/06/22 0803  GLUCAP  --    < > 157* 179* 185* 183* 141*  HGBA1C 6.1*  --   --   --   --   --   --    < > = values in this interval not displayed.    Parkinson's disease ([place]) Continue his Sinemet, Seroquel melatonin continue OOB PT OT, supportive care  Hypomagnesemia Hypokalemia/ hyponatremia: Electrolytes are stable.  Monitor while on diuresis.  Class 1 obesity Calculated BMI is 31.6   DVT prophylaxis: scd Code Status:  Code Status: Full Code Family Communication: plan of care discussed with patient at bedside.  Disposition: Currently not medically stable for discharge. Status is: Inpatient Remains inpatient appropriate because: Position  issue Objective: Vitals last 24 hrs: Vitals:   01/05/22 1226 01/05/22 1955 01/05/22 2035 01/06/22 0512  BP: 126/71  (!) 170/106 (!) 171/100  Pulse: 78 78 75 80  Resp: 20 (!) 24 (!) 24 20  Temp: 99.1 F (37.3 C)  99 F (37.2 C) 98.2 F (36.8 C)  TempSrc: Oral  Oral Oral  SpO2: 92%  94% 91%  Weight:      Height:       Weight change:   Physical Examination: General exam: AA,older than stated age, weak appearing. HEENT:Oral mucosa moist, Ear/Nose WNL grossly, dentition normal. Respiratory system: bilaterally diminished BS, no use of accessory muscle Cardiovascular system: S1 & S2 +, No JVD,. Gastrointestinal system: Abdomen soft,NT,ND, BS+ Nervous System:Alert, awake, moving extremities and grossly nonfocal Extremities: LE edema mild,distal peripheral pulses palpable.  Skin: No rashes,no icterus. MSK: Normal muscle bulk,tone, power  Medications reviewed:  Scheduled Meds:  acetaminophen  1,000 mg Oral BID   amoxicillin-clavulanate  1 tablet Oral Q12H   atorvastatin  20 mg Oral Daily   carbidopa-levodopa  1 tablet Oral TID   Carbidopa-Levodopa ER  1 tablet Oral QHS   cholecalciferol  1,000 Units Oral Daily   feeding supplement (GLUCERNA SHAKE)  237 mL Oral TID BM   furosemide  40 mg Intravenous BID   hydrALAZINE  10 mg Oral BID   insulin aspart  0-15 Units Subcutaneous TID WC   melatonin  10 mg Oral QHS   metoprolol tartrate  25 mg Oral BID   multivitamin with minerals  1 tablet Oral Daily   omega-3 acid ethyl esters  1 g Oral Daily   polyethylene glycol  17 g Oral Daily   QUEtiapine  12.5 mg Oral QHS   senna-docusate  1 tablet Oral BID   Continuous Infusions:    Diet Order             Diet Carb Modified Fluid consistency: Thin; Room service appropriate? Yes  Diet effective now                            Intake/Output Summary (Last 24 hours) at 01/06/2022 1150 Last data filed at 01/06/2022 0515 Gross per 24 hour  Intake 300 ml  Output 2275 ml  Net  -1975 ml   Net IO Since Admission: 130.17 mL [01/06/22 1150]  Wt Readings from Last 3 Encounters:  12/30/21 94.5 kg  12/29/21 94.7 kg  12/19/21 94.7 kg     Unresulted Labs (From admission, onward)    None     Data Reviewed: I have personally reviewed following labs and imaging studies CBC: Recent Labs  Lab 12/31/21 0515 01/01/22 0543 01/02/22 0511 01/04/22 0455 01/06/22 0527  WBC 22.9* 19.1* 14.3* 13.9* 14.1*  NEUTROABS  --  16.0* 11.2*  --   --   HGB 13.7 12.0* 11.7* 11.9* 13.5  HCT 40.8 35.6* 35.6* 36.0* 41.0  MCV 91.1 89.7 91.5 89.8 89.5  PLT 180 189 192 247 532   Basic Metabolic Panel: Recent Labs  Lab 01/01/22 0531 01/02/22 0511 01/03/22 0555 01/04/22 0455 01/05/22 0517 01/06/22 0527  NA 134* 133* 134* 137 134* 137  K 3.6 3.2* 4.1 3.5 3.8 3.7  CL 99 103 103 105 103 102  CO2 '26 23 23 '$ 23  23 25  GLUCOSE 145* 113* 141* 152* 146* 147*  BUN '20 19 17 15 17 18  '$ CREATININE 0.82 0.76 0.81 0.76 0.68 0.76  CALCIUM 8.4* 8.0* 8.4* 8.6* 8.5* 9.1  MG 1.4* 1.8 1.9 1.6*  --  1.7   GFR: Estimated Creatinine Clearance: 80.7 mL/min (by C-G formula based on SCr of 0.76 mg/dL). Liver Function Tests: Recent Labs  Lab 12/31/21 0515  AST 27  ALT 10  ALKPHOS 55  BILITOT 1.0  PROT 6.6  ALBUMIN 3.5   Recent Labs  Lab 12/31/21 0515 01/01/22 0531  LIPASE 75* 35   No results for input(s): AMMONIA in the last 168 hours. Coagulation Profile: No results for input(s): INR, PROTIME in the last 168 hours. Cardiac Enzymes: No results for input(s): CKTOTAL, CKMB, CKMBINDEX, TROPONINI in the last 168 hours. BNP (last 3 results) No results for input(s): PROBNP in the last 8760 hours. HbA1C: No results for input(s): HGBA1C in the last 72 hours. CBG: Recent Labs  Lab 01/05/22 0744 01/05/22 1214 01/05/22 1655 01/05/22 2112 01/06/22 0803  GLUCAP 157* 179* 185* 183* 141*   Lipid Profile: No results for input(s): CHOL, HDL, LDLCALC, TRIG, CHOLHDL, LDLDIRECT in the last 72  hours. Thyroid Function Tests: No results for input(s): TSH, T4TOTAL, FREET4, T3FREE, THYROIDAB in the last 72 hours. Anemia Panel: No results for input(s): VITAMINB12, FOLATE, FERRITIN, TIBC, IRON, RETICCTPCT in the last 72 hours. Sepsis Labs: Recent Labs  Lab 12/30/21 1254 12/31/21 0515 01/01/22 0531  LATICACIDVEN 2.6* 2.4* 1.0    Recent Results (from the past 240 hour(s))  Culture, blood (Routine X 2) w Reflex to ID Panel     Status: None   Collection Time: 12/30/21 11:18 AM   Specimen: BLOOD  Result Value Ref Range Status   Specimen Description   Final    BLOOD RIGHT ANTECUBITAL Performed at [facility], [place] [address], [place], [place] [address]    Special Requests   Final    BOTTLES DRAWN AEROBIC AND ANAEROBIC Blood Culture results may not be optimal due to an excessive volume of blood received in culture bottles Performed at [facility] [address] [place], [place] [address]    Culture   Final    NO GROWTH 5 DAYS Performed at [facility], [place] [address], [place], [place] [address]    Report Status 01/04/2022 FINAL  Final  Culture, blood (Routine X 2) w Reflex to ID Panel     Status: None   Collection Time: 12/30/21 11:23 AM   Specimen: BLOOD  Result Value Ref Range Status   Specimen Description   Final    BLOOD LEFT ANTECUBITAL Performed at [place] [address], [place], [place] [address]    Special Requests   Final    BOTTLES DRAWN AEROBIC AND ANAEROBIC Blood Culture results may not be optimal due to an excessive volume of blood received in culture bottles Performed at [facility] [address], [place], [place] [address]    Culture   Final    NO GROWTH 5 DAYS Performed at [facility], [place] [address], [place], [place] [address]    Report Status 01/04/2022 FINAL  Final  Resp Panel by RT-PCR (Flu A&B, Covid) Nasopharyngeal Swab     Status: None    Collection Time: 12/30/21 11:24 AM   Specimen: Nasopharyngeal Swab; Nasopharyngeal(NP) swabs in vial transport medium  Result Value Ref Range Status   SARS  Coronavirus 2 by RT PCR NEGATIVE NEGATIVE Final    Comment: (NOTE) SARS-CoV-2 target nucleic acids are NOT DETECTED.  The SARS-CoV-2 RNA is generally detectable in upper respiratory specimens during the acute phase of infection. The lowest concentration of SARS-CoV-2 viral copies this assay can detect is 138 copies/mL. A negative result does not preclude SARS-Cov-2 infection and should not be used as the sole basis for treatment or other patient management decisions. A negative result may occur with  improper specimen collection/handling, submission of specimen other than nasopharyngeal swab, presence of viral mutation(s) within the areas targeted by this assay, and inadequate number of viral copies(<138 copies/mL). A negative result must be combined with clinical observations, patient history, and epidemiological information. The expected result is Negative.  Fact Sheet for Patients:  EntrepreneurPulse.com.au  Fact Sheet for Healthcare Providers:  IncredibleEmployment.be  This test is no t yet approved or cleared by the [place] FDA and  has been authorized for detection and/or diagnosis of SARS-CoV-2 by FDA under an Emergency Use Authorization (EUA). This EUA will remain  in effect (meaning this test can be used) for the duration of the COVID-19 declaration under Section 564(b)(1) of the Act, 21 U.S.C.section 360bbb-3(b)(1), unless the authorization is terminated  or revoked sooner.       Influenza A by PCR NEGATIVE NEGATIVE Final   Influenza B by PCR NEGATIVE NEGATIVE Final    Comment: (NOTE) The Xpert Xpress SARS-CoV-2/FLU/RSV plus assay is intended as an aid in the diagnosis of influenza from Nasopharyngeal swab specimens and should not be used as a sole basis for treatment.  Nasal washings and aspirates are unacceptable for Xpert Xpress SARS-CoV-2/FLU/RSV testing.  Fact Sheet for Patients: EntrepreneurPulse.com.au  Fact Sheet for Healthcare Providers: IncredibleEmployment.be  This test is not yet approved or cleared by the [place] FDA and has been authorized for detection and/or diagnosis of SARS-CoV-2 by FDA under an Emergency Use Authorization (EUA). This EUA will remain in effect (meaning this test can be used) for the duration of the COVID-19 declaration under Section 564(b)(1) of the Act, 21 U.S.C. section 360bbb-3(b)(1), unless the authorization is terminated or revoked.  Performed at [facility], [place] [address] [place], [place] [address]   MRSA Next Gen by PCR, Nasal     Status: None   Collection Time: 12/30/21  3:30 PM   Specimen: Nasal Mucosa; Nasal Swab  Result Value Ref Range Status   MRSA by PCR Next Gen NOT DETECTED NOT DETECTED Final    Comment: (NOTE) The GeneXpert MRSA Assay (FDA approved for NASAL specimens only), is one component of a comprehensive MRSA colonization surveillance program. It is not intended to diagnose MRSA infection nor to guide or monitor treatment for MRSA infections. Test performance is not FDA approved in patients less than 51 years old. Performed at [facility], [place] [address], [place], [place] [address]   Urine Culture     Status: None   Collection Time: 12/31/21  6:45 PM   Specimen: Urine, Clean Catch  Result Value Ref Range Status   Specimen Description   Final    URINE, CLEAN CATCH Performed at [facility], [place] [address], [place], [place] [address]    Special Requests   Final    NONE Performed at [facility], [place] [address] [place], [place] [address]    Culture   Final    NO GROWTH Performed at [facility], [place],  [place] [address]    Report  Status 01/02/2022 FINAL  Final    Antimicrobials: Anti-infectives (From admission, onward)    Start     Dose/Rate Route Frequency Ordered Stop   01/05/22 1800  amoxicillin-clavulanate (AUGMENTIN) 875-125 MG per tablet 1 tablet        1 tablet Oral Every 12 hours 01/05/22 1445     12/30/21 2200  metroNIDAZOLE (FLAGYL) IVPB 500 mg  Status:  Discontinued        500 mg 100 mL/hr over 60 Minutes Intravenous Every 12 hours 12/30/21 1755 01/05/22 1445   12/30/21 1845  cefTRIAXone (ROCEPHIN) 2 g in sodium chloride 0.9 % 100 mL IVPB  Status:  Discontinued        2 g 200 mL/hr over 30 Minutes Intravenous Every 24 hours 12/30/21 1755 01/05/22 1445   12/30/21 1245  ceFEPIme (MAXIPIME) 2 g in sodium chloride 0.9 % 100 mL IVPB        2 g 200 mL/hr over 30 Minutes Intravenous  Once 12/30/21 1234 12/30/21 1342   12/30/21 1245  metroNIDAZOLE (FLAGYL) IVPB 500 mg        500 mg 100 mL/hr over 60 Minutes Intravenous  Once 12/30/21 1234 12/30/21 1417   12/30/21 1245  vancomycin (VANCOCIN) IVPB 1000 mg/200 mL premix        1,000 mg 200 mL/hr over 60 Minutes Intravenous  Once 12/30/21 1234 12/30/21 1417      Culture/Microbiology    Component Value Date/Time   SDES  12/31/2021 1845    URINE, CLEAN CATCH Performed at [facility], [place] [address], [place], [place] [address]    SPECREQUEST  12/31/2021 1845    NONE Performed at [facility], [place] [address] [place], [place] [address]    CULT  12/31/2021 1845    NO GROWTH Performed at [facility] [address] [place], [place] [address]    REPTSTATUS 01/02/2022 FINAL 12/31/2021 1845    Other culture-see note  Radiology Studies: DG Swallowing Func-Speech Pathology  Result Date: 01/04/2022 Table formatting from the original result was not included. Objective Swallowing Evaluation: Type of Study: MBS-Modified Barium Swallow Study  Patient Details Name: IHSAN NOMURA MRN: [id number] Date of Birth:  1941/08/03 Today's Date: 01/04/2022 Time: SLP Start Time (ACUTE ONLY): 1205 -SLP Stop Time (ACUTE ONLY): 1225 SLP Time Calculation (min) (ACUTE ONLY): 20 min Past Medical History: Past Medical History: Diagnosis Date  Arthritis   R shoulder, bone spur  Arthritis   "hands" (03/25/2016)  Chronic diastolic heart failure (HCC)   Chronic lower back pain   Coronary heart disease   Dr Pernell Dupre  H/O cardiovascular stress test   perhaps last one was 2009  Heart murmur   12/29/11 echo: mild MR, no AS, trivial TR  History of gout   Hyperlipidemia   Hypertension   saw Dr. Linard Millers last earl;y- 2014, cardiac cath. last done ?2009, blocks seen didn't require any intervention at that point.  Narcolepsy   [name] 04-11-97; Mean Latency 1.62mn, SOREM 2  OSA on CPAP   NPSG 12-13-98 AHI 22.7  PONV (postoperative nausea and vomiting)   Presence of permanent cardiac pacemaker   Shortness of breath   with exertion   Stroke ([place]   Type II diabetes mellitus ([place]  Past Surgical History: Past Surgical History: Procedure Laterality Date  CARDIAC CATHETERIZATION  2009?  EP IMPLANTABLE DEVICE N/A 03/25/2016  Procedure: Pacemaker Implant - Dual Chamber;  Surgeon: GChamp Mungo  Lovena Le, MD;  Location: [facility];  Service: Cardiovascular;  Laterality: N/A;  FRACTURE SURGERY    INSERT / REPLACE / REMOVE PACEMAKER  03/24/2016  IR KYPHO LUMBAR INC FX REDUCE BONE BX UNI/BIL CANNULATION INC/IMAGING  12/29/2021  IR RADIOLOGIST EVAL & MGMT  12/15/2021  LEFT AND RIGHT HEART CATHETERIZATION WITH CORONARY ANGIOGRAM N/A 06/05/2014  Procedure: LEFT AND RIGHT HEART CATHETERIZATION WITH CORONARY ANGIOGRAM;  Surgeon: Sinclair Grooms, MD;  Location: [facility] CATH LAB;  Service: Cardiovascular;  Laterality: N/A;  NASAL FRACTURE SURGERY    SHOULDER ARTHROSCOPY WITH ROTATOR CUFF REPAIR AND SUBACROMIAL DECOMPRESSION Right 08/16/2013  Procedure: SHOULDER ARTHROSCOPY WITH ROTATOR CUFF REPAIR AND SUBACROMIAL DECOMPRESSION;  Surgeon: [name], MD;  Location: [facility];   Service: Orthopedics;  Laterality: Right;  Right shoulder arthroscopy rotator cuff repair, subacromial decompression.  TONSILLECTOMY   HPI: Per MD note, Pt is an 81 yo male adm to [facility] from 2/17 to 2/22 - suspected due to medications - Seroquel was started.  Pt underwent kyphoplasty on 2/28 went home started have stomach pain and dry heave.  PMH of hypertension ,noninsulin-dependent type 2 diabetes, CVA, Parkinson's, bradycardia-s/p PPM placement, OSA, narcolepsy.  He was brought to the ED by EMS due to abdominal pain - put on oxygen by EMS due to o2 sats at 90's.  Wife reported to Md that patient has sleep apnea.  Wife reported to MD that patient has intermittent chronic cough.  CT abdomen Lower chest: Small right and trace left pleural effusions new from 10/29/2021.  Per MD note, Sepsis present on admission with tachypnea, leukocytosis, lactic acidosis, fever, source of infection likely intra-abdominal  - initial CT concerning for Pancreatitis versus proximal sigmoid colon diverticulitis, with elevated lipase, normal LFT.  Swallow evaluation ordered per RN discussion with wife and concern for pt having dysphagia. RN and NT report pt has not consumed po intake today due to his mentation.  Per prior brain imaging studies -Chronic infarct left frontal lobe anteriorly. Small  chronic infarcts in the cerebellum bilaterally. Chronic infarcts right and left thalamic.  Subjective: cooperative, fatigued  Recommendations for follow up therapy are one component of a multi-disciplinary discharge planning process, led by the attending physician.  Recommendations may be updated based on patient status, additional functional criteria and insurance authorization. Assessment / Plan / Recommendation Clinical Impressions 01/04/2022 Clinical Impression Patient presents with a mild-moderate oral and a mild pharyngeal phase dysphagia. During oral phase, patient with decreased bolus cohesion with barium tablet in puree solids, requiring  thin liquid barium sip to transit. He exhibited a munch chewing pattern with graham cracker. He did exhibit decreased anterior to posterior movement of liquids as well but with good oral containment of bolus and no premature spillage in vallecular sinus observed. During pharyngeal phase of swallow, patient exhibited a couple instances of mildly delayed swallow initiation at level of vallecular sinus and trace amount of vallecular residuals observed post initial swallows with thin liquids. No penetration or aspiration observed and cricopharyngeal phase of swallow appeared [facility] with no backflow of barium. Esophageal sweep did not reveal any significant s/s of dysmotility or stasis of barium. Pharyngeal phase of swallow was [facility] for puree solids, barium tablet in puree and regular texture solids. SLP is recommending to upgrade liquids to thin and continue with regular solids. SLP Visit Diagnosis Dysphagia, oropharyngeal phase (R13.12) Attention and concentration deficit following -- Frontal lobe and executive function deficit following -- Impact on safety and function Mild aspiration risk  Treatment Recommendations 01/04/2022 Treatment Recommendations Therapy as outlined in treatment plan below   Prognosis 01/04/2022 Prognosis for Safe Diet Advancement Good Barriers to Reach Goals -- Barriers/Prognosis Comment -- Diet Recommendations 01/04/2022 SLP Diet Recommendations Regular solids;Thin liquid Liquid Administration via Cup;Straw Medication Administration Whole meds with liquid Compensations Slow rate;Small sips/bites Postural Changes Seated upright at 90 degrees   Other Recommendations 01/04/2022 Recommended Consults -- Oral Care Recommendations Oral care BID;Staff/trained caregiver to provide oral care Other Recommendations -- Follow Up Recommendations Skilled nursing-short term rehab (<3 hours/day) Assistance recommended at discharge Frequent or constant Supervision/Assistance Functional Status Assessment -- Frequency and  Duration  01/04/2022 Speech Therapy Frequency (ACUTE ONLY) min 1 x/week Treatment Duration 1 week   Oral Phase 01/04/2022 Oral Phase Impaired Oral - Pudding Teaspoon -- Oral - Pudding Cup -- Oral - Honey Teaspoon -- Oral - Honey Cup -- Oral - Nectar Teaspoon -- Oral - Nectar Cup Delayed oral transit;Reduced posterior propulsion;Weak lingual manipulation Oral - Nectar Straw -- Oral - Thin Teaspoon -- Oral - Thin Cup Reduced posterior propulsion;Delayed oral transit;Weak lingual manipulation Oral - Thin Straw Delayed oral transit;Weak lingual manipulation;Reduced posterior propulsion Oral - Puree Delayed oral transit;Reduced posterior propulsion;Weak lingual manipulation Oral - Mech Soft -- Oral - Regular Reduced posterior propulsion;Delayed oral transit;Impaired mastication Oral - Multi-Consistency -- Oral - Pill Decreased bolus cohesion;Delayed oral transit;Weak lingual manipulation;Reduced posterior propulsion Oral Phase - Comment --  Pharyngeal Phase 01/04/2022 Pharyngeal Phase Impaired Pharyngeal- Pudding Teaspoon -- Pharyngeal -- Pharyngeal- Pudding Cup -- Pharyngeal -- Pharyngeal- Honey Teaspoon -- Pharyngeal -- Pharyngeal- Honey Cup -- Pharyngeal -- Pharyngeal- Nectar Teaspoon -- Pharyngeal -- Pharyngeal- Nectar Cup WFL Pharyngeal -- Pharyngeal- Nectar Straw -- Pharyngeal -- Pharyngeal- Thin Teaspoon -- Pharyngeal -- Pharyngeal- Thin Cup Delayed swallow initiation-vallecula;Pharyngeal residue - valleculae Pharyngeal -- Pharyngeal- Thin Straw Pharyngeal residue - valleculae Pharyngeal -- Pharyngeal- Puree -- Pharyngeal -- Pharyngeal- Mechanical Soft -- Pharyngeal -- Pharyngeal- Regular WFL Pharyngeal -- Pharyngeal- Multi-consistency -- Pharyngeal -- Pharyngeal- Pill WFL Pharyngeal -- Pharyngeal Comment --  Cervical Esophageal Phase  01/04/2022 Cervical Esophageal Phase WFL Pudding Teaspoon -- Pudding Cup -- Honey Teaspoon -- Honey Cup -- Nectar Teaspoon -- Nectar Cup -- Nectar Straw -- Thin Teaspoon -- Thin Cup -- Thin  Straw -- Puree -- Mechanical Soft -- Regular -- Multi-consistency -- Pill -- Cervical Esophageal Comment -- Sonia Baller, MA, CCC-SLP Speech Therapy                       LOS: 7 days   Antonieta Pert, MD [facility]  01/06/2022, 11:50 AM

## 2022-01-07 DIAGNOSIS — R5381 Other malaise: Secondary | ICD-10-CM

## 2022-01-07 DIAGNOSIS — K5900 Constipation, unspecified: Secondary | ICD-10-CM

## 2022-01-07 LAB — COMPREHENSIVE METABOLIC PANEL
ALT: 9 U/L (ref 0–44)
AST: 21 U/L (ref 15–41)
Albumin: 2.9 g/dL — ABNORMAL LOW (ref 3.5–5.0)
Alkaline Phosphatase: 77 U/L (ref 38–126)
Anion gap: 8 (ref 5–15)
BUN: 21 mg/dL (ref 8–23)
CO2: 25 mmol/L (ref 22–32)
Calcium: 9 mg/dL (ref 8.9–10.3)
Chloride: 104 mmol/L (ref 98–111)
Creatinine, Ser: 0.79 mg/dL (ref 0.61–1.24)
GFR, Estimated: >60 mL/min (ref >60)
Glucose, Bld: 162 mg/dL — ABNORMAL HIGH (ref 70–99)
Potassium: 3.6 mmol/L (ref 3.5–5.1)
Sodium: 137 mmol/L (ref 135–145)
Total Bilirubin: 0.3 mg/dL (ref 0.3–1.2)
Total Protein: 6.5 g/dL (ref 6.5–8.1)

## 2022-01-07 LAB — CBC
HCT: 38.4 % — ABNORMAL LOW (ref 39.0–52.0)
Hemoglobin: 13 g/dL (ref 13.0–17.0)
MCH: 30.2 pg (ref 26.0–34.0)
MCHC: 33.9 g/dL (ref 30.0–36.0)
MCV: 89.1 fL (ref 80.0–100.0)
Platelets: 330 10*3/uL (ref 150–400)
RBC: 4.31 MIL/uL (ref 4.22–5.81)
RDW: 13.2 % (ref 11.5–15.5)
WBC: 13.8 10*3/uL — ABNORMAL HIGH (ref 4.0–10.5)
nRBC: 0 % (ref 0.0–0.2)

## 2022-01-07 LAB — GLUCOSE, CAPILLARY
Glucose-Capillary: 238 mg/dL — ABNORMAL HIGH (ref 70–99)
Glucose-Capillary: 167 mg/dL — ABNORMAL HIGH (ref 70–99)
Glucose-Capillary: 255 mg/dL — ABNORMAL HIGH (ref 70–99)

## 2022-01-07 MED ORDER — METFORMIN HCL 500 MG PO TABS
1000.0000 mg | ORAL_TABLET | Freq: Every day | ORAL | Status: DC
  Administered 2022-01-08: 1000 mg via ORAL
  Filled 2022-01-07: qty 2

## 2022-01-07 MED ORDER — METFORMIN HCL 500 MG PO TABS
2000.0000 mg | ORAL_TABLET | Freq: Every day | ORAL | Status: DC
Start: 2022-01-08 — End: 2022-01-07

## 2022-01-07 MED ORDER — FUROSEMIDE 20 MG PO TABS
20.0000 mg | ORAL_TABLET | Freq: Every day | ORAL | Status: DC
  Administered 2022-01-08: 20 mg via ORAL
  Filled 2022-01-07: qty 1

## 2022-01-07 NOTE — Care Management Important Message (Signed)
Important Message ? ?Patient Details IM Letter placed in Patients room. ?Name: Jordan [name] ?MRN: [id number] ?Date of Birth: 09/06/1941 ? ? ?Medicare Important Message Given:  Yes ? ? ? ? ?Kerin Salen ?01/07/2022, 11:33 AM ?

## 2022-01-07 NOTE — TOC Progression Note (Signed)
Transition of Care (TOC) - Progression Note  ? ? ?Patient Details  ?Name: DSHAWN MCNAY ?MRN: [id number] ?Date of Birth: [birth year] ? ?Transition of Care (TOC) CM/SW Contact  ?Trish Mage, LCSW ?Phone Number: ?01/07/2022, 1:57 PM ? ?Clinical Narrative:  Received message from facility stating they have insurance auth for SNF.  According to chart, he is being considered for CIR.  Contacted CIR to let them know of this development.  Patient is medically stable and ready for transfer. TOC will continue to follow during the course of hospitalization. ?  ? ? ? ?Expected Discharge Plan: [place] ?Barriers to Discharge: Other (must enter comment) [employer] authorization) ? ?Expected Discharge Plan and Services ?Expected Discharge Plan: [place] ?  ?Discharge Planning Services: CM Consult ?Post Acute Care Choice: [place] ?Living arrangements for the past 2 months: Apartment ?                ?  ?  ?  ?  ?  ?  ?  ?  ?  ?  ? ? ?Social Determinants of Health (SDOH) Interventions ?  ? ?Readmission Risk Interventions ?No flowsheet data found. ? ?

## 2022-01-07 NOTE — Progress Notes (Signed)
Speech Language Pathology Treatment: Dysphagia  ?Patient Details ?Name: Jordan [name] ?MRN: [id number] ?DOB: [birth year] ?Today's Date: 01/07/2022 ?Time: 2536-6440 ?SLP Time Calculation (min) (ACUTE ONLY): 24 min ? ?Assessment / Plan / Recommendation ?Clinical Impression ? Pt was seen for f/u after MBS on 3/6. Pt consumed a portion of his meal tray, self-fed after minimal assistance from SLP for set-up. He had one cough noted after he had stopped all intake - not necessarily following any particular type of PO. We called his wife on speaker phone per her request and she mentioned that he has still been having some coughing episodes. It sounds like this is occurring during consumption of thin liquids but also at times when he is not drinking (such as when he's laughing). Discussed that this could potentially be unrelated to swallowing given no aspiration on MBS. MBS is also only a snapshot in time, so reinforced general aspiration precautions to implement with caution throughout PO intake. Education was also provided on the importance of oral hygiene and staying as active as possible to try to reduce the risk of dysphagia related adverse events. Will leave on regular diet and thin liquids but will continue to follow given concerns.  ?  ?HPI HPI: Per MD note, Pt is an 81 yo male adm to [facility] from 2/17 to 2/22 - suspected due to medications - Seroquel was started.  Pt underwent kyphoplasty on 2/28 went home started have stomach pain and dry heave.  PMH of hypertension ,noninsulin-dependent type 2 diabetes, CVA, Parkinson's, bradycardia-s/p PPM placement, OSA, narcolepsy.  He was brought to the ED by EMS due to abdominal pain - put on oxygen by EMS due to o2 sats at 90's.  Wife reported to Md that patient has sleep apnea.  Wife reported to MD that patient has intermittent chronic cough.  CT abdomen Lower chest: Small right and trace left pleural effusions new from 10/29/2021.  Per MD note, Sepsis present on admission with  tachypnea, leukocytosis, lactic acidosis, fever, source of infection likely intra-abdominal  - initial CT concerning for Pancreatitis versus proximal sigmoid colon diverticulitis, with elevated lipase, normal LFT.  Swallow evaluation ordered per RN discussion with wife and concern for pt having dysphagia. RN and NT report pt has not consumed po intake today due to his mentation.  Per prior brain imaging studies -Chronic infarct left frontal lobe anteriorly. Small  chronic infarcts in the cerebellum bilaterally. Chronic infarcts right and left thalamic. ?  ?   ?SLP Plan ? Continue with current plan of care ? ?  ?  ?Recommendations for follow up therapy are one component of a multi-disciplinary discharge planning process, led by the attending physician.  Recommendations may be updated based on patient status, additional functional criteria and insurance authorization. ?  ? ?Recommendations  ?Diet recommendations: Regular;Thin liquid ?Liquids provided via: Cup;Straw ?Medication Administration: Whole meds with puree ?Supervision: Full supervision/cueing for compensatory strategies;Staff to assist with self feeding ?Compensations: Slow rate;Small sips/bites ?Postural Changes and/or Swallow Maneuvers: Seated upright 90 degrees;Upright 30-60 min after meal  ?   ?    ?   ? ? ? ? Oral Care Recommendations: Oral care BID ?Follow Up Recommendations: Acute inpatient rehab (3hours/day) ?Assistance recommended at discharge: Frequent or constant Supervision/Assistance ?SLP Visit Diagnosis: Dysphagia, oropharyngeal phase (R13.12) ?Plan: Continue with current plan of care ? ? ? ? ?  ?  ? ? ?Osie Bond., M.A. CCC-SLP ?Acute Rehabilitation Services ?Pager [phone] ?Office [phone] ? ?01/07/2022, 1:35 PM ?

## 2022-01-07 NOTE — Progress Notes (Signed)
Occupational Therapy Treatment ?Patient Details ?Name: Jordan [name] ?MRN: [id number] ?DOB: 10-Feb-1941 ?Today's Date: 01/07/2022 ? ? ?History of present illness 81 y.o. male with medical history significant of hypertension ,noninsulin-dependent type 2 diabetes, CVA, Parkinson's, bradycardia-s/p PPM placement, OSA, narcolepsy, brought to ED by EMS due to due to abdominal pain, he was put on oxygen by EMS due to o2 sats at 19's, wife reports patient has sleep apnea.  He was hospitalized from 2/17 to 2/22 due to hallucination, Zoloft and Klonopin was discontinued, he was started on Seroquel which helped hallucination, he underwent kyphoplasty on 2/28 went home. Now readmitted with stomach pain and dry heave. Dx of sepsis, pancreatitis vs diverticulitis. ?  ?OT comments ? Patient progressing well with acute OT goals. Patient a +1 assist today with min A and cues for hand placement to stand from recliner. Once standing patient has posterior bias and kyphotic posture needing max cues throughout session and mod A to take few steps to sink. Patient leans heavily onto sink and reliant on unilateral support while trying to brush his teeth. Had to finish task in sitting. Patient instructed in standing balance task needing mod A and max multimodal cues for feedback on posture. Patient motivated to be able to walk with walker again. Updated D/C recommendations as patient and spouse hopeful for AIR post acute hospital stay.   ? ?Recommendations for follow up therapy are one component of a multi-disciplinary discharge planning process, led by the attending physician.  Recommendations may be updated based on patient status, additional functional criteria and insurance authorization. ?   ?Follow Up Recommendations ? Acute inpatient rehab (3hours/day)  ?  ?Assistance Recommended at Discharge Frequent or constant Supervision/Assistance  ?Patient can return home with the following ? A lot of help with walking and/or transfers;A lot of  help with bathing/dressing/bathroom;Assistance with cooking/housework;Assist for transportation;Help with stairs or ramp for entrance ?  ?Equipment Recommendations ? Other (comment) (defer next venue)  ?  ?Recommendations for Other Services Rehab consult ? ?  ?Precautions / Restrictions Precautions ?Precautions: Fall ?Precaution Comments: monitor vitals ?Restrictions ?Weight Bearing Restrictions: No  ? ? ?  ? ?Mobility Bed Mobility ?  ?  ?  ?  ?  ?  ?  ?General bed mobility comments: In recliner ?  ? ? ?  ?Balance Overall balance assessment: Needs assistance, History of Falls ?Sitting-balance support: Feet supported ?Sitting balance-Leahy Scale: Fair ?  ?Postural control: Posterior lean ?Standing balance support: Single extremity supported, Bilateral upper extremity supported ?Standing balance-Leahy Scale: Poor ?Standing balance comment: Heavily reliant on at least unilateral upper extremity support while standing at sink ?  ?  ?  ?  ?  ?  ?  ?  ?  ?  ?  ?   ? ?ADL either performed or assessed with clinical judgement  ? ?ADL Overall ADL's : Needs assistance/impaired ?  ?  ?Grooming: Minimal assistance;Set up;Sitting;Standing ?Grooming Details (indicate cue type and reason): Patient with kyphotic posture over the sink neding max multimodal cues to try and correct. Difficulty brushing teeth in standing position needing to sit to complete due to standing fatigue and balance. ?  ?  ?  ?  ?  ?  ?  ?  ?Toilet Transfer: Moderate assistance;Cueing for safety;Rolling walker (2 wheels) ?Toilet Transfer Details (indicate cue type and reason): Patient is able to power up to standing at min A level however is unsteady with posterior bias needing cues to correct posture. Knees also buckling at times needing  mod A to safely perform transfers. ?  ?Toileting - Clothing Manipulation Details (indicate cue type and reason): Condom cath fell off during treatment, CNA notified. ?  ?  ?Functional mobility during ADLs: Moderate  assistance;Cueing for safety;Rolling walker (2 wheels) ?  ?  ? ? ? ?Cognition Arousal/Alertness: Awake/alert ?Behavior During Therapy: [facility] for tasks assessed/performed ?Overall Cognitive Status: No family/caregiver present to determine baseline cognitive functioning ?  ?  ?  ?  ?  ?  ?  ?  ?  ?  ?  ?  ?  ?  ?  ?  ?General Comments: Patient is following directions appropriately, likes to joke ?  ?  ?   ?Exercises Exercises: Other exercises ?Other Exercises ?Other Exercises: Participated in standing balance task needing max cues to maintain upright posture due to posterior lean and kyphosis. Patient able to alternate reaching standing at sink x8 reps before sitting in recliner. ? ?  ?   ?   ? ? ?Pertinent Vitals/ Pain       Pain Assessment ?Pain Assessment: No/denies pain ? ?   ?   ? ?Frequency ? Min 2X/week  ? ? ? ? ?  ?Progress Toward Goals ? ?OT Goals(current goals can now be found in the care plan section) ? Progress towards OT goals: Progressing toward goals ? ?Acute Rehab OT Goals ?Patient Stated Goal: Be able to walk ?OT Goal Formulation: With patient ?Time For Goal Achievement: 01/18/22 ?Potential to Achieve Goals: Good ?ADL Goals ?Pt Will Perform Grooming: with supervision;sitting ?Pt Will Transfer to Toilet: bedside commode;with min assist;stand pivot transfer ?Additional ADL Goal #1: Patient will perform 10 min functional activity or exercise activity as evidence of improving activity tolerance  ?Plan Discharge plan needs to be updated   ? ?   ?AM-PAC OT "6 Clicks" Daily Activity     ?Outcome Measure ? ? Help from another person eating meals?: A Little ?Help from another person taking care of personal grooming?: A Little ?Help from another person toileting, which includes using toliet, bedpan, or urinal?: A Lot ?Help from another person bathing (including washing, rinsing, drying)?: A Lot ?Help from another person to put on and taking off regular upper body clothing?: A Little ?Help from another person to  put on and taking off regular lower body clothing?: A Lot ?6 Click Score: 15 ? ?  ?End of Session Equipment Utilized During Treatment: Gait belt;Rolling walker (2 wheels) ? ?OT Visit Diagnosis: Unsteadiness on feet (R26.81);Other abnormalities of gait and mobility (R26.89);Muscle weakness (generalized) (M62.81);History of falling (Z91.81) ?  ?Activity Tolerance Patient tolerated treatment well ?  ?Patient Left in chair;with call bell/phone within reach;with chair alarm set ?  ?Nurse Communication Mobility status;Other (comment) (condom cath off) ?  ? ?   ? ?Time: 1761-6073 ?OT Time Calculation (min): 30 min ? ?Charges: OT General Charges ?$OT Visit: 1 Visit ?OT Treatments ?$Self Care/Home Management : 23-37 mins ? ?Delbert Phenix OT ?OT pager: [phone] ? ? ?Rosemary Holms ?01/07/2022, 10:26 AM ?

## 2022-01-07 NOTE — Progress Notes (Signed)
[facility] IP rehab admissions - I spoke with wife by phone.  Wife very much wants CIR at [facility].  I have called [employer] and have opened the case requesting acute inpatient rehab admission.  I will follow up with all once I hear back from insurance case manager.  Call for questions.  [phone] ?

## 2022-01-07 NOTE — Progress Notes (Signed)
PROGRESS NOTE Jordan [name]  XKG:[id number] DOB: 1941-10-06 DOA: 12/30/2021 PCP: [name], MD   Brief Narrative/Hospital Course: 81 yo male with the past medical history of hypertension, T2DM, Parkinson's disease, narcolepsy, OSA and bradycardia sp pacer who presented with abdominal pain. Reported severe abdominal pain, EMS was called and patient was brought to the hospital. Recent hospitalization On 02/17 to 02/22 for hallucinations. 02/28 had kypho-plasty. On his initial physical examination his blood pressure was 176/84, HR 71, RR 18 to 25, 02 saturation 97% and temp 99,2. His lungs were clear to auscultation, heart with S1 and S2 present and rhythmic, abdomen not distended, no lower extremity edema.  Na 137, K 4,9, CL 102, bicarbonate 24, glucose 185, bun 31 and cr 0,97Lactic acid 2,7 Wbc 22.0, hgb 14.7 hvt 43,6 and plt 231 Urine analysis sg 1,026 with negative leukocytes. CT abdomen with new inflammatory change within the pancreatic tail and surrounding mesenteric fat extending to the hepatic flexure of the colon with mild inflammatory changes and mild wall thickening.  Jordan [name] was admitted to the hospital with the working diagnosis of pancreatitis, proximal sigmoid diverticulitis, was placed on antibiotic therapy and IV fluids.3/05 developed volume overload and required diuresis with furosemide. Patient found to have metabolic encephalopathy with delirium. Wife is hoping for CIR-PT suggesting a skilled nursing facility.    Subjective: Seen and examined this morning.  Complains of constipation otherwise no complaints no nausea vomiting abdominal pain fever chills. OT working with him in the room. Overnight no fever, vitals stable.    Assessment and Plan: * Pancreatitis With proximal sigmoid diverticulitis Sepsis poa: sepsis - resolved.  Pancreatitis diverticulitis managed now with oral antibiotics, tolerating diet.  Continue Augmentin. Has had significant leukocytosis and now  downtrending.enies any abdominal pain today.monitor CBC count Recent Labs  Lab 01/01/22 0531 01/01/22 0543 01/02/22 0511 01/04/22 0455 01/06/22 0527 01/07/22 0502  WBC  --  19.1* 14.3* 13.9* 14.1* 13.8*  LATICACIDVEN 1.0  --   --   --   --   --     Acute metabolic encephalopathy Improved at baseline.  Continue supportive care. CPAP nightly,if recurrent somnolence may need ABG and Bipap,suspected obesity hypoventilation syndrome. Continue to hold on haloperidol, avoid sedative medications.   Essential hypertension Leg edema/volume overload Cough and wheezing. BP is controlled continue hydralazine metoprolol. He has been having edema and wheezing, his last echocardiogram from is from 04/2020 with preserved LV EF 65 to 70%, preserved RV systolic function, RVSP 70,2 with no significant valvular disease.  On IV Lasix transition to p.o.repeat Echo-EF 60 to 65%, mild concentric LVH, indeterminate LV diastolic function  OSA on CPAP/narcolepsy  Continue with Cpap.   Type 2 diabetes mellitus with hyperlipidemia (HCC) A1c stable at 6.1, CBG with uncontrolled hyperglycemia in 200s, resume metformin and continue SSI.Continue statin. Recent Labs  Lab 01/05/22 2112 01/06/22 0803 01/06/22 1220 01/06/22 1652 01/06/22 2020  GLUCAP 183* 141* 182* 199* 213*    Parkinson's disease (HCC) Continue his Sinemet, Seroquel melatonin continue OOB PT OT, supportive care  Hypomagnesemia Hypokalemia/ hyponatremia: Electrolytes are stable.  Monitor while on diuresis.  Class 1 obesity Calculated BMI is 31.6   Constipation Added stool softener.  DVT prophylaxis: enoxaparin (LOVENOX) injection 40 mg Start: 01/06/22 2200scd Code Status:   Code Status: Full Code Family Communication: plan of care discussed with patient at bedside.  Disposition: Currently not medically stable for discharge. Status is: Inpatient Remains inpatient appropriate because: Position issue Objective: Vitals last 24  hrs: Vitals:  01/06/22 1232 01/06/22 1400 01/06/22 1956 01/07/22 0439  BP: 104/60 (!) 125/105 (!) 153/85 (!) 143/79  Pulse: 69 62 77 66  Resp: '20 14 20 18  '$ Temp: 98.7 F (37.1 C) 98.6 F (37 C) 99.8 F (37.7 C) 97.8 F (36.6 C)  TempSrc:      SpO2: 94% 97% 91% 93%  Weight:      Height:       Weight change:   Physical Examination: General exam: AA oriented, pleasant,older than stated age, weak appearing. HEENT:Oral mucosa moist, Ear/Nose WNL grossly, dentition normal. Respiratory system: bilaterally diminished,no use of accessory muscle Cardiovascular system: S1 & S2 +, No JVD,. Gastrointestinal system: Abdomen soft,NT,ND, BS+ Nervous System:Alert, awake, moving extremities and grossly nonfocal Extremities: edema neg,distal peripheral pulses palpable.  Skin: No rashes,no icterus. MSK: Normal muscle bulk,tone, power   Medications reviewed:  Scheduled Meds:  acetaminophen  1,000 mg Oral BID   amoxicillin-clavulanate  1 tablet Oral Q12H   atorvastatin  20 mg Oral Daily   carbidopa-levodopa  1 tablet Oral TID   Carbidopa-Levodopa ER  1 tablet Oral QHS   cholecalciferol  1,000 Units Oral Daily   enoxaparin (LOVENOX) injection  40 mg Subcutaneous Q24H   feeding supplement (GLUCERNA SHAKE)  237 mL Oral TID BM   furosemide  20 mg Oral Daily   hydrALAZINE  10 mg Oral BID   insulin aspart  0-15 Units Subcutaneous TID WC   melatonin  10 mg Oral QHS   [START ON 01/08/2022] metFORMIN  1,000 mg Oral Q breakfast   metoprolol tartrate  25 mg Oral BID   multivitamin with minerals  1 tablet Oral Daily   omega-3 acid ethyl esters  1 g Oral Daily   polyethylene glycol  17 g Oral Daily   QUEtiapine  12.5 mg Oral QHS   senna-docusate  1 tablet Oral BID   Continuous Infusions:    Diet Order             Diet Carb Modified Fluid consistency: Thin; Room service appropriate? Yes  Diet effective now                            Intake/Output Summary (Last 24 hours) at  01/07/2022 1116 Last data filed at 01/07/2022 0930 Gross per 24 hour  Intake 958 ml  Output 2500 ml  Net -1542 ml   Net IO Since Admission: -1,411.83 mL [01/07/22 1116]  Wt Readings from Last 3 Encounters:  12/30/21 94.5 kg  12/29/21 94.7 kg  12/19/21 94.7 kg     Unresulted Labs (From admission, onward)    None     Data Reviewed: I have personally reviewed following labs and imaging studies CBC: Recent Labs  Lab 01/01/22 0543 01/02/22 0511 01/04/22 0455 01/06/22 0527 01/07/22 0502  WBC 19.1* 14.3* 13.9* 14.1* 13.8*  NEUTROABS 16.0* 11.2*  --   --   --   HGB 12.0* 11.7* 11.9* 13.5 13.0  HCT 35.6* 35.6* 36.0* 41.0 38.4*  MCV 89.7 91.5 89.8 89.5 89.1  PLT 189 192 247 306 053   Basic Metabolic Panel: Recent Labs  Lab 01/01/22 0531 01/02/22 0511 01/03/22 0555 01/04/22 0455 01/05/22 0517 01/06/22 0527 01/07/22 0502  NA 134* 133* 134* 137 134* 137 137  K 3.6 3.2* 4.1 3.5 3.8 3.7 3.6  CL 99 103 103 105 103 102 104  CO2 '26 23 23 23 23 25 25  '$ GLUCOSE 145* 113* 141* 152* 146* 147*  162*  BUN '20 19 17 15 17 18 21  '$ CREATININE 0.82 0.76 0.81 0.76 0.68 0.76 0.79  CALCIUM 8.4* 8.0* 8.4* 8.6* 8.5* 9.1 9.0  MG 1.4* 1.8 1.9 1.6*  --  1.7  --    GFR: Estimated Creatinine Clearance: 80.7 mL/min (by C-G formula based on SCr of 0.79 mg/dL). Liver Function Tests: Recent Labs  Lab 01/07/22 0502  AST 21  ALT 9  ALKPHOS 77  BILITOT 0.3  PROT 6.5  ALBUMIN 2.9*   Recent Labs  Lab 01/01/22 0531  LIPASE 35   No results for input(s): AMMONIA in the last 168 hours. Coagulation Profile: No results for input(s): INR, PROTIME in the last 168 hours. Cardiac Enzymes: No results for input(s): CKTOTAL, CKMB, CKMBINDEX, TROPONINI in the last 168 hours. BNP (last 3 results) No results for input(s): PROBNP in the last 8760 hours. HbA1C: No results for input(s): HGBA1C in the last 72 hours. CBG: Recent Labs  Lab 01/05/22 2112 01/06/22 0803 01/06/22 1220 01/06/22 1652  01/06/22 2020  GLUCAP 183* 141* 182* 199* 213*   Lipid Profile: No results for input(s): CHOL, HDL, LDLCALC, TRIG, CHOLHDL, LDLDIRECT in the last 72 hours. Thyroid Function Tests: No results for input(s): TSH, T4TOTAL, FREET4, T3FREE, THYROIDAB in the last 72 hours. Anemia Panel: No results for input(s): VITAMINB12, FOLATE, FERRITIN, TIBC, IRON, RETICCTPCT in the last 72 hours. Sepsis Labs: Recent Labs  Lab 01/01/22 0531  LATICACIDVEN 1.0    Recent Results (from the past 240 hour(s))  Culture, blood (Routine X 2) w Reflex to ID Panel     Status: None   Collection Time: 12/30/21 11:18 AM   Specimen: BLOOD  Result Value Ref Range Status   Specimen Description   Final    BLOOD RIGHT ANTECUBITAL Performed at [facility] [address], [place], [place] [address]    Special Requests   Final    BOTTLES DRAWN AEROBIC AND ANAEROBIC Blood Culture results may not be optimal due to an excessive volume of blood received in culture bottles Performed at [facility] [address], [place], [place] [address]    Culture   Final    NO GROWTH 5 DAYS Performed at [facility], [place] [address] [place], [place] [address]    Report Status 01/04/2022 FINAL  Final  Culture, blood (Routine X 2) w Reflex to ID Panel     Status: None   Collection Time: 12/30/21 11:23 AM   Specimen: BLOOD  Result Value Ref Range Status   Specimen Description   Final    BLOOD LEFT ANTECUBITAL Performed at [facility] [address], [place], [place] [address]    Special Requests   Final    BOTTLES DRAWN AEROBIC AND ANAEROBIC Blood Culture results may not be optimal due to an excessive volume of blood received in culture bottles Performed at [facility] [address], [place], [place] [address]    Culture   Final    NO GROWTH 5 DAYS Performed at [facility], [place] [address], [place], [place] [address]    Report  Status 01/04/2022 FINAL  Final  Resp Panel by RT-PCR (Flu A&B, Covid) Nasopharyngeal Swab     Status: None   Collection Time: 12/30/21 11:24 AM   Specimen: Nasopharyngeal Swab; Nasopharyngeal(NP) swabs in vial transport medium  Result Value Ref Range Status   SARS Coronavirus 2 by RT PCR NEGATIVE NEGATIVE Final    Comment: (  NOTE) SARS-CoV-2 target nucleic acids are NOT DETECTED.  The SARS-CoV-2 RNA is generally detectable in upper respiratory specimens during the acute phase of infection. The lowest concentration of SARS-CoV-2 viral copies this assay can detect is 138 copies/mL. A negative result does not preclude SARS-Cov-2 infection and should not be used as the sole basis for treatment or other patient management decisions. A negative result may occur with  improper specimen collection/handling, submission of specimen other than nasopharyngeal swab, presence of viral mutation(s) within the areas targeted by this assay, and inadequate number of viral copies(<138 copies/mL). A negative result must be combined with clinical observations, patient history, and epidemiological information. The expected result is Negative.  Fact Sheet for Patients:  EntrepreneurPulse.com.au  Fact Sheet for Healthcare Providers:  IncredibleEmployment.be  This test is no t yet approved or cleared by the [place] FDA and  has been authorized for detection and/or diagnosis of SARS-CoV-2 by FDA under an Emergency Use Authorization (EUA). This EUA will remain  in effect (meaning this test can be used) for the duration of the COVID-19 declaration under Section 564(b)(1) of the Act, 21 U.S.C.section 360bbb-3(b)(1), unless the authorization is terminated  or revoked sooner.       Influenza A by PCR NEGATIVE NEGATIVE Final   Influenza B by PCR NEGATIVE NEGATIVE Final    Comment: (NOTE) The Xpert Xpress SARS-CoV-2/FLU/RSV plus assay is intended as an aid in the  diagnosis of influenza from Nasopharyngeal swab specimens and should not be used as a sole basis for treatment. Nasal washings and aspirates are unacceptable for Xpert Xpress SARS-CoV-2/FLU/RSV testing.  Fact Sheet for Patients: EntrepreneurPulse.com.au  Fact Sheet for Healthcare Providers: IncredibleEmployment.be  This test is not yet approved or cleared by the [place] FDA and has been authorized for detection and/or diagnosis of SARS-CoV-2 by FDA under an Emergency Use Authorization (EUA). This EUA will remain in effect (meaning this test can be used) for the duration of the COVID-19 declaration under Section 564(b)(1) of the Act, 21 U.S.C. section 360bbb-3(b)(1), unless the authorization is terminated or revoked.  Performed at [facility], [place] [address], [place], [place] [address]   MRSA Next Gen by PCR, Nasal     Status: None   Collection Time: 12/30/21  3:30 PM   Specimen: Nasal Mucosa; Nasal Swab  Result Value Ref Range Status   MRSA by PCR Next Gen NOT DETECTED NOT DETECTED Final    Comment: (NOTE) The GeneXpert MRSA Assay (FDA approved for NASAL specimens only), is one component of a comprehensive MRSA colonization surveillance program. It is not intended to diagnose MRSA infection nor to guide or monitor treatment for MRSA infections. Test performance is not FDA approved in patients less than 63 years old. Performed at [facility], [place] [address], [place], [place] [address]   Urine Culture     Status: None   Collection Time: 12/31/21  6:45 PM   Specimen: Urine, Clean Catch  Result Value Ref Range Status   Specimen Description   Final    URINE, CLEAN CATCH Performed at [facility], [place] [address] [place], [place] [address]    Special Requests   Final    NONE Performed at [facility], [place] [address] [place], [place] [address]    Culture    Final    NO GROWTH Performed at [facility], [place] [address], [place], [place] [address]    Report Status 01/02/2022 FINAL  Final  Antimicrobials: Anti-infectives (From admission, onward)    Start     Dose/Rate Route Frequency Ordered Stop   01/05/22 1800  amoxicillin-clavulanate (AUGMENTIN) 875-125 MG per tablet 1 tablet        1 tablet Oral Every 12 hours 01/05/22 1445     12/30/21 2200  metroNIDAZOLE (FLAGYL) IVPB 500 mg  Status:  Discontinued        500 mg 100 mL/hr over 60 Minutes Intravenous Every 12 hours 12/30/21 1755 01/05/22 1445   12/30/21 1845  cefTRIAXone (ROCEPHIN) 2 g in sodium chloride 0.9 % 100 mL IVPB  Status:  Discontinued        2 g 200 mL/hr over 30 Minutes Intravenous Every 24 hours 12/30/21 1755 01/05/22 1445   12/30/21 1245  ceFEPIme (MAXIPIME) 2 g in sodium chloride 0.9 % 100 mL IVPB        2 g 200 mL/hr over 30 Minutes Intravenous  Once 12/30/21 1234 12/30/21 1342   12/30/21 1245  metroNIDAZOLE (FLAGYL) IVPB 500 mg        500 mg 100 mL/hr over 60 Minutes Intravenous  Once 12/30/21 1234 12/30/21 1417   12/30/21 1245  vancomycin (VANCOCIN) IVPB 1000 mg/200 mL premix        1,000 mg 200 mL/hr over 60 Minutes Intravenous  Once 12/30/21 1234 12/30/21 1417      Culture/Microbiology    Component Value Date/Time   SDES  12/31/2021 1845    URINE, CLEAN CATCH Performed at [facility], [place] [address] [place], [place] [address]    SPECREQUEST  12/31/2021 1845    NONE Performed at [facility], [place] [address], [place], [place] [address]    CULT  12/31/2021 1845    NO GROWTH Performed at [facility] [address], [place], [place] [address]    REPTSTATUS 01/02/2022 FINAL 12/31/2021 1845    Other culture-see note  Radiology Studies: ECHOCARDIOGRAM COMPLETE  Result Date: 01/06/2022    ECHOCARDIOGRAM REPORT   Patient Name:   BRENNEN CAMPER Date of Exam: 01/06/2022 Medical Rec #:  [id number]       Height:        68.0 in Accession #:    [id number]      Weight:       208.3 lb Date of Birth:  1940/12/11       BSA:          2.080 m Patient Age:    24 years        BP:           171/60 mmHg Patient Gender: M               HR:           80 bpm. Exam Location:  Inpatient Procedure: 2D Echo Indications:    Dyspnea  History:        Patient has prior history of Echocardiogram examinations, most                 recent 04/15/2020. Stroke, Signs/Symptoms:Murmur and Shortness of                 Breath; Risk Factors:Dyslipidemia, Hypertension and Diabetes.  Sonographer:    Arlyss Gandy Referring Phys: [id number] [place]  1. Left ventricular ejection fraction, by estimation, is 60 to 65%. The left ventricle has normal function. The left ventricle has no regional wall motion abnormalities. There is mild concentric left ventricular hypertrophy. Left  ventricular diastolic parameters are indeterminate.  2. Right ventricular systolic function is low normal. The right ventricular size is normal. There is normal pulmonary artery systolic pressure.  3. Left atrial size was mildly dilated.  4. Right atrial size was mildly dilated.  5. The mitral valve is grossly normal. Trivial mitral valve regurgitation. No evidence of mitral stenosis. Moderate mitral annular calcification.  6. The aortic valve was not well visualized. Aortic valve regurgitation is trivial. Mild aortic valve stenosis.  7. The inferior vena cava is normal in size with <50% respiratory variability, suggesting right atrial pressure of 8 mmHg. Comparison(s): No significant change from prior study. Conclusion(s)/Recommendation(s): Otherwise normal echocardiogram, with minor abnormalities described in the report. FINDINGS  Left Ventricle: Left ventricular ejection fraction, by estimation, is 60 to 65%. The left ventricle has normal function. The left ventricle has no regional wall motion abnormalities. The left ventricular internal cavity size was normal in  size. There is  mild concentric left ventricular hypertrophy. Abnormal (paradoxical) septal motion, consistent with left bundle branch block. Left ventricular diastolic parameters are indeterminate. Right Ventricle: The right ventricular size is normal. Right vetricular wall thickness was not well visualized. Right ventricular systolic function is low normal. There is normal pulmonary artery systolic pressure. The tricuspid regurgitant velocity is 2.01 m/s, and with an assumed right atrial pressure of 8 mmHg, the estimated right ventricular systolic pressure is 79.3 mmHg. Left Atrium: Left atrial size was mildly dilated. Right Atrium: Right atrial size was mildly dilated. Pericardium: There is no evidence of pericardial effusion. Mitral Valve: The mitral valve is grossly normal. There is mild thickening of the mitral valve leaflet(s). There is mild calcification of the mitral valve leaflet(s). Moderate mitral annular calcification. Trivial mitral valve regurgitation. No evidence of mitral valve stenosis. Tricuspid Valve: The tricuspid valve is not well visualized. Tricuspid valve regurgitation is trivial. No evidence of tricuspid stenosis. Aortic Valve: The aortic valve was not well visualized. Aortic valve regurgitation is trivial. Mild aortic stenosis is present. Aortic valve mean gradient measures 7.0 mmHg. Aortic valve peak gradient measures 13.3 mmHg. Aortic valve area, by VTI measures 2.59 cm. Pulmonic Valve: The pulmonic valve was not well visualized. Pulmonic valve regurgitation is not visualized. Aorta: The aortic root and ascending aorta are structurally normal, with no evidence of dilitation and the aortic arch was not well visualized. Venous: The inferior vena cava is normal in size with less than 50% respiratory variability, suggesting right atrial pressure of 8 mmHg. IAS/Shunts: The atrial septum is grossly normal. Additional Comments: A device lead is visualized in the right ventricle and right  atrium.  LEFT VENTRICLE PLAX 2D LVIDd:         3.70 cm   Diastology LVIDs:         2.60 cm   LV e' medial:    7.29 cm/s LV PW:         1.30 cm   LV E/e' medial:  9.5 LV IVS:        1.30 cm   LV e' lateral:   8.27 cm/s LVOT diam:     2.20 cm   LV E/e' lateral: 8.4 LV SV:         81 LV SV Index:   39 LVOT Area:     3.80 cm  RIGHT VENTRICLE             IVC RV Basal diam:  3.70 cm     IVC diam: 2.00 cm RV Mid diam:  3.00 cm RV S prime:     11.30 cm/s TAPSE (M-mode): 1.6 cm LEFT ATRIUM             Index        RIGHT ATRIUM           Index LA Vol (A2C):   47.4 ml 22.79 ml/m  RA Area:     16.60 cm LA Vol (A4C):   44.7 ml 21.50 ml/m  RA Volume:   42.80 ml  20.58 ml/m LA Biplane Vol: 46.4 ml 22.31 ml/m  AORTIC VALVE AV Area (Vmax):    2.56 cm AV Area (Vmean):   2.42 cm AV Area (VTI):     2.59 cm AV Vmax:           182.50 cm/s AV Vmean:          125.500 cm/s AV VTI:            0.313 m AV Peak Grad:      13.3 mmHg AV Mean Grad:      7.0 mmHg LVOT Vmax:         123.00 cm/s LVOT Vmean:        80.000 cm/s LVOT VTI:          0.213 m LVOT/AV VTI ratio: 0.68  AORTA Ao Asc diam: 3.30 cm MITRAL VALVE                TRICUSPID VALVE MV Area (PHT): 2.01 cm     TR Peak grad:   16.2 mmHg MV Decel Time: 377 msec     TR Vmax:        201.00 cm/s MV E velocity: 69.50 cm/s MV A velocity: 125.00 cm/s  SHUNTS MV E/A ratio:  0.56         Systemic VTI:  0.21 m                             Systemic Diam: 2.20 cm Buford Dresser MD Electronically signed by Buford Dresser MD Signature Date/Time: 01/06/2022/4:33:05 PM    Final      LOS: 8 days   Antonieta Pert, MD [facility]  01/07/2022, 11:16 AM

## 2022-01-07 NOTE — PMR Pre-admission (Signed)
PMR Admission Coordinator Pre-Admission Assessment  Patient: Jordan [name] is an 81 y.o., male MRN: [id number] DOB: 1941-05-14 Height: _0  (172.7 cm) Weight: 94.5 kg  Insurance Information HMO: Yes    PPO:       PCP:       IPA:       80/20:       OTHER:  PRIMARY: [employer] Medicare Advantage      Policy#: [id number]      Subscriber: patient CM Name:                           Phone#:                          Fax#: [phone] Pre-Cert#: [id number] approved after peer to peer completed 01/08/22      Employer: Retired Benefits:  Phone #: [phone]     Name: [place]. Date: 11/01/21     Deduct: $0      Out of Pocket Max: [phone] (met $1257.36)      Life Max: N/A CIR: $325/day co-pay for days 1-6      SNF: $10 co-pay for days 1-20; $196 days 21-60; $0 days 61-100 Outpatient: med nec     Co-Pay: $20/visit Home Health: 100%      Co-Pay: none DME: 80%     Co-Pay: 20% Providers: in network  SECONDARY:       Policy#:      Phone#:   Development worker, community:       Phone#:   The Actuary for patients in Inpatient Rehabilitation Facilities with attached Privacy Act [place] Care Records was provided and verbally reviewed with: Patient and Family  Emergency Contact Information Contact Information     Name Relation Home Work Mobile   Mer[place] Spouse [phone]  [phone]       Current Medical History  Patient Admitting Diagnosis: Debility, pancreatitis  History of Present Illness:  An 81 y.o. male with medical history significant of hypertension ,noninsulin-dependent type 2 diabetes, CVA, Parkinson's, bradycardia-s/p PPM placement, OSA, narcolepsy, brought to ED by EMS due to due to abdominal pain, he was put on oxygen by EMS due to o2 sats at 21's, wife reports patient has sleep apnea.  I have reviewed his record which showed, he was hospitalized from 2/17 to 2/22 due to hallucination, Zoloft and Klonopin was discontinued, he was started  on Seroquel which helped hallucination, he underwent kyphoplasty on 2/28 went home started have stomach pain and dry heave , wife called EMS.  Currently patient is tremorous wife states this is likely due to did not take his Sinemet today, currently patient report pain is much better, there is no fever, report had a regular bowel movement yesterday.  Denies dysuria.  Wife report patient has intermittent chronic cough which has not changed recently.  PT/OT evaluations completed with recommendations for post acute rehab program.   Patient's medical record from [facility] has been reviewed by the rehabilitation admission coordinator and physician.  Past Medical History  Past Medical History:  Diagnosis Date   Arthritis    R shoulder, bone spur   Arthritis    "hands" (03/25/2016)   Chronic diastolic heart failure (HCC)    Chronic lower back pain    Coronary heart disease    Dr Pernell Dupre   H/O cardiovascular stress test    perhaps last one was 2009  Heart murmur    12/29/11 echo: mild MR, no AS, trivial TR   History of gout    Hyperlipidemia    Hypertension    saw Dr. Linard Millers last earl;y- 2014, cardiac cath. last done ?2009, blocks seen didn't require any intervention at that point.   Narcolepsy    [name] 04-11-97; Mean Latency 1.75mn, SOREM 2   OSA on CPAP    NPSG 12-13-98 AHI 22.7   PONV (postoperative nausea and vomiting)    Presence of permanent cardiac pacemaker    Shortness of breath    with exertion    [place]    [place]     Has the patient had major surgery during 100 days prior to admission? Yes  Family History   family history includes Colon cancer in his sister; Sleep apnea in his sister.  Current Medications  Current Facility-Administered Medications:    acetaminophen (TYLENOL) tablet 1,000 mg, 1,000 mg, Oral, BID, XFlorencia Reasons MD, 1,000 mg at 01/08/22 1035   amoxicillin-clavulanate (AUGMENTIN) 875-125 MG per tablet 1 tablet, 1  tablet, Oral, Q12H, Arrien, MJimmy Picket MD, 1 tablet at 01/08/22 1035   atorvastatin (LIPITOR) tablet 20 mg, 20 mg, Oral, Daily, XFlorencia Reasons MD, 20 mg at 01/08/22 1035   carbidopa-levodopa (SINEMET IR) 25-100 MG per tablet immediate release 1 tablet, 1 tablet, Oral, TID, XFlorencia Reasons MD, 1 tablet at 01/08/22 1034   Carbidopa-Levodopa ER (SINEMET CR) 25-100 MG tablet controlled release 1 tablet, 1 tablet, Oral, QHS, XFlorencia Reasons MD, 1 tablet at 01/07/22 2153   cholecalciferol (VITAMIN D3) tablet 1,000 Units, 1,000 Units, Oral, Daily, XFlorencia Reasons MD, 1,000 Units at 01/08/22 1035   docusate sodium (COLACE) capsule 100 mg, 100 mg, Oral, BID, Kc, Ramesh, MD, 100 mg at 01/08/22 1034   enoxaparin (LOVENOX) injection 40 mg, 40 mg, Subcutaneous, Q24H, SLenis Noon RPH, 40 mg at 01/07/22 2155   feeding supplement (GLUCERNA SHAKE) (GLUCERNA SHAKE) liquid 237 mL, 237 mL, Oral, TID BM, XFlorencia Reasons MD, 237 mL at 01/08/22 1036   furosemide (LASIX) tablet 20 mg, 20 mg, Oral, Daily, Kc, Ramesh, MD, 20 mg at 01/08/22 1034   hydrALAZINE (APRESOLINE) tablet 10 mg, 10 mg, Oral, BID, XFlorencia Reasons MD, 10 mg at 01/08/22 1035   insulin aspart (novoLOG) injection 0-15 Units, 0-15 Units, Subcutaneous, TID WC, XFlorencia Reasons MD, 3 Units at 01/08/22 1036   melatonin tablet 10 mg, 10 mg, Oral, QHS, XFlorencia Reasons MD, 10 mg at 01/07/22 2154   metFORMIN (GLUCOPHAGE) tablet 1,000 mg, 1,000 mg, Oral, Q breakfast, Kc, Ramesh, MD, 1,000 mg at 01/08/22 1034   metoprolol tartrate (LOPRESSOR) tablet 25 mg, 25 mg, Oral, BID, XFlorencia Reasons MD, 25 mg at 01/08/22 1035   multivitamin with minerals tablet 1 tablet, 1 tablet, Oral, Daily, XFlorencia Reasons MD, 1 tablet at 01/08/22 1035   nitroGLYCERIN (NITROSTAT) SL tablet 0.4 mg, 0.4 mg, Sublingual, Q5 min PRN, XFlorencia Reasons MD   omega-3 acid ethyl esters (LOVAZA) capsule 1 g, 1 g, Oral, Daily, XFlorencia Reasons MD, 1 g at 01/08/22 1034   polyethylene glycol (MIRALAX / GLYCOLAX) packet 17 g, 17 g, Oral, Daily, XFlorencia Reasons MD, 17 g at  01/08/22 1036   polyvinyl alcohol (LIQUIFILM TEARS) 1.4 % ophthalmic solution 1 drop, 1 drop, Both Eyes, PRN, XFlorencia Reasons MD   QUEtiapine (SEROQUEL) tablet 12.5 mg, 12.5 mg, Oral, QHS, XFlorencia Reasons MD, 12.5 mg at 01/07/22 2153   senna-docusate (Senokot-S) tablet 1 tablet, 1 tablet,  Oral, BID, Florencia Reasons, MD, 1 tablet at 01/08/22 1035   traMADol (ULTRAM) tablet 50 mg, 50 mg, Oral, Q6H PRN, Florencia Reasons, MD  Patients Current Diet:  Diet Order             Diet Carb Modified Fluid consistency: Thin; Room service appropriate? Yes  Diet effective now                   Precautions / Restrictions Precautions Precautions: Fall Precaution Comments: monitor vitals Restrictions Weight Bearing Restrictions: No   Has the patient had 2 or more falls or a fall with injury in the past year? Yes  Prior Activity Level Household: Martin Majestic out for MD appointments.  Prior Functional Level Self Care: Did the patient need help bathing, dressing, using the toilet or eating? Independent  Indoor Mobility: Did the patient need assistance with walking from room to room (with or without device)? Independent  Stairs: Did the patient need assistance with internal or external stairs (with or without device)? Independent  Functional Cognition: Did the patient need help planning regular tasks such as shopping or remembering to take medications? Needed some help  Patient Information Are you of Hispanic, Latino/a,or Spanish origin?: A. No, not of Hispanic, Latino/a, or Spanish origin What is your race?: A. White Do you need or want an interpreter to communicate with a doctor or health care staff?: 0. No  Patient's Response To:  Health Literacy and Transportation Is the patient able to respond to health literacy and transportation needs?: Yes Health Literacy - How often do you need to have someone help you when you read instructions, pamphlets, or other written material from your doctor or pharmacy?: Sometimes In the  past 12 months, has lack of transportation kept you from medical appointments or from getting medications?: No In the past 12 months, has lack of transportation kept you from meetings, work, or from getting things needed for daily living?: No  Development worker, international aid / [place] Devices/Equipment: Wellsite geologist, Wheelchair Home Equipment: Shower seat, Conservation officer, nature (2 wheels)  Prior Device Use: Indicate devices/aids used by the patient prior to current illness, exacerbation or injury?  T [place] chair  Current Functional Level Cognition  Overall Cognitive Status: Impaired/Different from baseline Orientation Level: Oriented to person, Oriented to place, Disoriented to time, Disoriented to situation Following Commands: Follows one step commands consistently, Follows one step commands with increased time Safety/Judgement: Decreased awareness of deficits, Decreased awareness of safety General Comments: pt initially alseep upon arrival to room, initially confused about PT arrival but after second explanation understands. Requires cuing for form/safety throughout mobility, enjoys making jokes during session    Extremity Assessment (includes Sensation/Coordination)  Upper Extremity Assessment: Defer to OT evaluation RUE Deficits / Details: limited over head ROM, poor dexterity/fine motor, globally weak with 4/5 MMT RUE Coordination: decreased fine motor  Lower Extremity Assessment: Generalized weakness (B knee ext ~+3/5 (assessment limited by ability to follow commands))    ADLs  Overall ADL's : Needs assistance/impaired Eating/Feeding: Minimal assistance, Bed level Eating/Feeding Details (indicate cue type and reason): increased time to process bringing cup to mouth of thickned liquids with cues for small sips as per SLP recommendations Grooming: Minimal assistance, Set up, Sitting, Standing Grooming Details (indicate cue type and reason): Patient with kyphotic posture over the  sink neding max multimodal cues to try and correct. Difficulty brushing teeth in standing position needing to sit to complete due to standing fatigue and balance. Upper Body Bathing: Maximal assistance,  Bed level Lower Body Bathing: Total assistance, Bed level Upper Body Dressing : Maximal assistance, Bed level Lower Body Dressing: Bed level, Total assistance Toilet Transfer: Moderate assistance, Cueing for safety, Rolling walker (2 wheels) Toilet Transfer Details (indicate cue type and reason): Patient is able to power up to standing at min A level however is unsteady with posterior bias needing cues to correct posture. Knees also buckling at times needing mod A to safely perform transfers. Toileting- Clothing Manipulation and Hygiene: Total assistance, Bed level Toileting - Clothing Manipulation Details (indicate cue type and reason): Condom cath fell off during treatment, CNA notified. Functional mobility during ADLs: Moderate assistance, Cueing for safety, Rolling walker (2 wheels)    Mobility  Overal bed mobility: Needs Assistance Bed Mobility: Rolling, Sidelying to Sit Rolling: Min assist Sidelying to sit: Min assist, HOB elevated Supine to sit: Max assist Sit to supine: Max assist, +2 for safety/equipment General bed mobility comments: assist for log roll to EOB, trunk elevation via HHA. Increased time and sequencing cues throughout    Transfers  Overall transfer level: Needs assistance Equipment used: Rolling walker (2 wheels) Transfers: Sit to/from Stand Sit to Stand: Min assist Bed to/from chair/wheelchair/BSC transfer type:: Stand pivot Stand pivot transfers: Min assist, From elevated surface General transfer comment: light power up and steadying assist, STS x4, from EOB x2 and recliner x2.    Ambulation / Gait / Stairs / Wheelchair Mobility  Ambulation/Gait Ambulation/Gait assistance: Min assist, +2 safety/equipment (chair follow) Gait Distance (Feet): 40 Feet (+25, seated  rest break needed) Assistive device: Rolling walker (2 wheels) Gait Pattern/deviations: Step-through pattern, Decreased stride length, Trunk flexed General Gait Details: min steadying assist, cues for upright posture, placement in RW Gait velocity: decr Pre-gait activities: Lateral weight shifting, standing marches, lateral side stepping along EOB    Posture / Balance Dynamic Sitting Balance Sitting balance - Comments: initially required min/mod A for sitting balance, then fair Balance Overall balance assessment: Needs assistance, History of Falls Sitting-balance support: Feet supported Sitting balance-Leahy Scale: Fair Sitting balance - Comments: initially required min/mod A for sitting balance, then fair Postural control: Posterior lean Standing balance support: Bilateral upper extremity supported, Reliant on assistive device for balance Standing balance-Leahy Scale: Poor Standing balance comment: reliant on AD    Special needs/care consideration Diabetic management Yes h/o DM and Behavioral consideration Confused   Previous Home Environment (from acute therapy documentation) Living Arrangements: Spouse/significant other, Children Available Help at Discharge: Family Type of Home: Apartment Home Layout: One level Home Access: Elevator Bathroom Shower/Tub: Public librarian, Multimedia programmer: Handicapped height Home Care Services: Yes Additional Comments: no family present during this OT eval and pt able to provide limited PLOF  Discharge Living Setting Plans for Discharge Living Setting: Patient's home, Other (Comment), Lives with (comment) (Lives in a condo with wife and autistic son.) Type of Home at Discharge: Other (Comment) (condominium) Discharge Home Layout: One level (On the 10th floor.) Discharge Home Access: Elevator Discharge Bathroom Shower/Tub: Tub/shower unit, Walk-in shower, Curtain Discharge Bathroom Toilet: Handicapped height Discharge Bathroom  Accessibility: Yes How Accessible: Accessible via walker Does the patient have any problems obtaining your medications?: No  Social/Family/Support Systems Patient Roles: Spouse, Parent (Has a wife and a 21 yo autistic son.) Contact Information: [place] - wife Anticipated Caregiver: wife Anticipated Caregiver's Contact Information: [name] - wife - [phone] Ability/Limitations of Caregiver: Wife can assist. Caregiver Availability: 24/7 Discharge Plan Discussed with Primary Caregiver: Yes Is Caregiver In Agreement with Plan?: Yes Does  Caregiver/Family have Issues with Lodging/Transportation while Pt is in Rehab?: No  Goals Patient/Family Goal for Rehab: PT/OT mod I and supervision goals Expected length of stay: 12 days Pt/Family Agrees to Admission and willing to participate: Yes Program Orientation Provided & Reviewed with Pt/Caregiver Including Roles  & Responsibilities: Yes  Decrease burden of Care through IP rehab admission: N/A  Possible need for SNF placement upon discharge: Not planned  Patient Condition: I have reviewed medical records from [facility], spoken with CSW, and spouse. I discussed via phone for inpatient rehabilitation assessment.  Patient will benefit from ongoing PT and OT, can actively participate in 3 hours of therapy a day 5 days of the week, and can make measurable gains during the admission.  Patient will also benefit from the coordinated team approach during an Inpatient Acute Rehabilitation admission.  The patient will receive intensive therapy as well as Rehabilitation physician, nursing, social worker, and care management interventions.  Due to bladder management, bowel management, safety, skin/wound care, disease management, medication administration, pain management, and patient education the patient requires 24 hour a day rehabilitation nursing.  The patient is currently Min to mod assist with mobility and basic ADLs.  Discharge  setting and therapy post discharge at home with home health is anticipated.  Patient has agreed to participate in the Acute Inpatient Rehabilitation Program and will admit today.  Preadmission Screen Completed By:  Retta Diones, 01/08/2022 11:48 AM ______________________________________________________________________   Discussed status with Dr. Ranell Patrick on 01/08/22 at 1149 and received approval for admission today.  Admission Coordinator:  Retta Diones, RN, time 1205/Date 01/08/22   Assessment/Plan: Diagnosis: Debility secondary to pancreatitis Does the need for close, 24 hr/day Medical supervision in concert with the patient's rehab needs make it unreasonable for this patient to be served in a less intensive setting? Yes Co-Morbidities requiring supervision/potential complications: class 1 obesity, OSA on CPAP, narcolepsy, type 2 DM, hyperlipidemia Due to bladder management, bowel management, safety, skin/wound care, disease management, medication administration, pain management, and patient education, does the patient require 24 hr/day rehab nursing? Yes Does the patient require coordinated care of a physician, rehab nurse, PT, OT to address physical and functional deficits in the context of the above medical diagnosis(es)? Yes Addressing deficits in the following areas: balance, endurance, locomotion, strength, transferring, bowel/bladder control, bathing, dressing, feeding, grooming, toileting, and psychosocial support Can the patient actively participate in an intensive therapy program of at least 3 hrs of therapy 5 days a week? Yes The potential for patient to make measurable gains while on inpatient rehab is excellent Anticipated functional outcomes upon discharge from inpatient rehab: supervision PT, supervision OT, independent SLP Estimated rehab length of stay to reach the above functional goals is: 6-8 days Anticipated discharge destination: Home 10. Overall Rehab/Functional  Prognosis: excellent   MD Signature: Leeroy Cha, MD

## 2022-01-07 NOTE — Assessment & Plan Note (Addendum)
Continue MiraLAX and stool softener. ?

## 2022-01-07 NOTE — Progress Notes (Signed)
Inpatient Rehab Admissions Coordinator Note:  ? ?Per updated PT recommendations patient was screened for CIR candidacy by Michel Santee, PT. At this time, pt appears to be a potential candidate for CIR. I will place an order for rehab consult for full assessment, per our protocol.  Please contact me any with questions.. ? ?Shann Medal, PT, DPT ?[phone] ?01/07/22 ?9:20 AM  ?

## 2022-01-07 NOTE — Progress Notes (Signed)
Physical Therapy Treatment ?Patient Details ?Name: Jordan [name] ?MRN: [id number] ?DOB: 1940-12-18 ?Today's Date: 01/07/2022 ? ? ?History of Present Illness 81 y.o. male with medical history significant of hypertension ,noninsulin-dependent type 2 diabetes, CVA, Parkinson's, bradycardia-s/p PPM placement, OSA, narcolepsy, brought to ED by EMS due to due to abdominal pain, he was put on oxygen by EMS due to o2 sats at 52's, wife reports patient has sleep apnea.  He was hospitalized from 2/17 to 2/22 due to hallucination, Zoloft and Klonopin was discontinued, he was started on Seroquel which helped hallucination, he underwent kyphoplasty on 2/28 went home. Now readmitted with stomach pain and dry heave. Dx of sepsis, pancreatitis vs diverticulitis. ? ?  ?PT Comments  ? ? Pt sleeping upon arrival to room, initially appears confused but improves as session continues. Pt ambulatory for x2 short hallway distances with RW, requires min physical assist throughout mobility and mod-max cuing for form/safety. Pt eager to build tolerance for activity and strength, would be a great AIR candidate at this time. PT to continue to follow.  ? ?   ?Recommendations for follow up therapy are one component of a multi-disciplinary discharge planning process, led by the attending physician.  Recommendations may be updated based on patient status, additional functional criteria and insurance authorization. ? ?Follow Up Recommendations ? Acute inpatient rehab (3hours/day) ?  ?  ?Assistance Recommended at Discharge Frequent or constant Supervision/Assistance  ?Patient can return home with the following Assist for transportation;Help with stairs or ramp for entrance;Assistance with cooking/housework;A little help with walking and/or transfers;A little help with bathing/dressing/bathroom ?  ?Equipment Recommendations ? Rolling walker (2 wheels)  ?  ?Recommendations for Other Services   ? ? ?  ?Precautions / Restrictions Precautions ?Precautions:  Fall ?Precaution Comments: monitor vitals ?Restrictions ?Weight Bearing Restrictions: No  ?  ? ?Mobility ? Bed Mobility ?Overal bed mobility: Needs Assistance ?Bed Mobility: Rolling, Sidelying to Sit ?Rolling: Min assist ?Sidelying to sit: Min assist, HOB elevated ?  ?  ?  ?General bed mobility comments: assist for log roll to EOB, trunk elevation via HHA. Increased time and sequencing cues throughout ?  ? ?Transfers ?Overall transfer level: Needs assistance ?Equipment used: Rolling walker (2 wheels) ?Transfers: Sit to/from Stand ?Sit to Stand: Min assist ?  ?  ?  ?  ?  ?General transfer comment: light power up and steadying assist, STS x4, from EOB x2 and recliner x2. ?  ? ?Ambulation/Gait ?Ambulation/Gait assistance: Min assist, +2 safety/equipment (chair follow) ?Gait Distance (Feet): 40 Feet (+25, seated rest break needed) ?Assistive device: Rolling walker (2 wheels) ?Gait Pattern/deviations: Step-through pattern, Decreased stride length, Trunk flexed ?Gait velocity: decr ?  ?  ?General Gait Details: min steadying assist, cues for upright posture, placement in RW ? ? ?Stairs ?  ?  ?  ?  ?  ? ? ?Wheelchair Mobility ?  ? ?Modified Rankin (Stroke Patients Only) ?  ? ? ?  ?Balance Overall balance assessment: Needs assistance, History of Falls ?Sitting-balance support: Feet supported ?Sitting balance-Leahy Scale: Fair ?  ?Postural control: Posterior lean ?Standing balance support: Bilateral upper extremity supported, Reliant on assistive device for balance ?Standing balance-Leahy Scale: Poor ?Standing balance comment: reliant on AD ?  ?  ?  ?  ?  ?  ?  ?  ?  ?  ?  ?  ? ?  ?Cognition Arousal/Alertness: Awake/alert ?Behavior During Therapy: [facility] for tasks assessed/performed ?Overall Cognitive Status: Impaired/Different from baseline ?Area of Impairment: Orientation, Following commands, Safety/judgement, Problem  solving ?  ?  ?  ?  ?  ?  ?  ?  ?Orientation Level: Disoriented to, Situation ?  ?  ?Following Commands:  Follows one step commands consistently, Follows one step commands with increased time ?Safety/Judgement: Decreased awareness of deficits, Decreased awareness of safety ?  ?Problem Solving: Slow processing, Requires verbal cues, Requires tactile cues ?General Comments: pt initially alseep upon arrival to room, initially confused about PT arrival but after second explanation understands. Requires cuing for form/safety throughout mobility, enjoys making jokes during session ?  ?  ? ?  ?Exercises   ? ?  ?General Comments   ?  ?  ? ?Pertinent Vitals/Pain Pain Assessment ?Pain Assessment: Faces ?Faces Pain Scale: Hurts little more ?Pain Location: back and neck ?Pain Descriptors / Indicators: Grimacing, Guarding ?Pain Intervention(s): Limited activity within patient's tolerance, Monitored during session, Repositioned  ? ? ?Home Living   ?  ?  ?  ?  ?  ?  ?  ?  ?  ?   ?  ?Prior Function    ?  ?  ?   ? ?PT Goals (current goals can now be found in the care plan section) Acute Rehab PT Goals ?Patient Stated Goal: for pt to get stronger ?PT Goal Formulation: With patient/family ?Time For Goal Achievement: 01/14/22 ?Potential to Achieve Goals: Fair ?Progress towards PT goals: Progressing toward goals ? ?  ?Frequency ? ? ? Min 3X/week ? ? ? ?  ?PT Plan Current plan remains appropriate  ? ? ?Co-evaluation   ?  ?  ?  ?  ? ?  ?AM-PAC PT "6 Clicks" Mobility   ?Outcome Measure ? Help needed turning from your back to your side while in a flat bed without using bedrails?: A Little ?Help needed moving from lying on your back to sitting on the side of a flat bed without using bedrails?: A Little ?Help needed moving to and from a bed to a chair (including a wheelchair)?: A Little ?Help needed standing up from a chair using your arms (e.g., wheelchair or bedside chair)?: A Little ?Help needed to walk in hospital room?: A Lot (cuing) ?Help needed climbing 3-5 steps with a railing? : A Lot (cuing) ?6 Click Score: 16 ? ?  ?End of Session  Equipment Utilized During Treatment: Gait belt ?Activity Tolerance: Patient tolerated treatment well ?Patient left: in chair;with call bell/phone within reach;with chair alarm set ?Nurse Communication: Mobility status ?PT Visit Diagnosis: Other abnormalities of gait and mobility (R26.89);History of falling (Z91.81);Difficulty in walking, not elsewhere classified (R26.2) ?  ? ? ?Time: 6767-2094 ?PT Time Calculation (min) (ACUTE ONLY): 27 min ? ?Charges:  $Gait Training: 8-22 mins ?$Therapeutic Activity: 8-22 mins          ?          ? ?Stacie Glaze, PT DPT ?Acute Rehabilitation Services ?Pager [phone]  ?Office [phone] ? ? ? ?Adalin Vanderploeg E Stroup ?01/07/2022, 4:02 PM ? ?

## 2022-01-08 ENCOUNTER — Inpatient Hospital Stay (HOSPITAL_COMMUNITY)
Admission: RE | Admit: 2022-01-08 | Discharge: 2022-01-18 | DRG: 945 | Disposition: A | Discharge status: Home / Self Care | Payer: Medicare (Managed Care) | Source: Other Acute Inpatient Hospital | Attending: Physical Medicine and Rehabilitation | Admitting: Physical Medicine and Rehabilitation

## 2022-01-08 ENCOUNTER — Other Ambulatory Visit: Payer: Self-pay

## 2022-01-08 ENCOUNTER — Encounter (HOSPITAL_COMMUNITY): Payer: Self-pay | Admitting: Physical Medicine and Rehabilitation

## 2022-01-08 DIAGNOSIS — Z6833 Body mass index (BMI) 33.0-33.9, adult: Secondary | ICD-10-CM

## 2022-01-08 DIAGNOSIS — R1310 Dysphagia, unspecified: Secondary | ICD-10-CM | POA: Diagnosis not present

## 2022-01-08 DIAGNOSIS — K5900 Constipation, unspecified: Secondary | ICD-10-CM | POA: Diagnosis not present

## 2022-01-08 DIAGNOSIS — E46 Unspecified protein-calorie malnutrition: Secondary | ICD-10-CM | POA: Diagnosis present

## 2022-01-08 DIAGNOSIS — E785 Hyperlipidemia, unspecified: Secondary | ICD-10-CM | POA: Diagnosis present

## 2022-01-08 DIAGNOSIS — D72828 Other elevated white blood cell count: Secondary | ICD-10-CM | POA: Diagnosis present

## 2022-01-08 DIAGNOSIS — R5381 Other malaise: Principal | ICD-10-CM | POA: Diagnosis present

## 2022-01-08 DIAGNOSIS — Z800 Family history of malignant neoplasm of digestive organs: Secondary | ICD-10-CM

## 2022-01-08 DIAGNOSIS — Z8673 Personal history of transient ischemic attack (TIA), and cerebral infarction without residual deficits: Secondary | ICD-10-CM

## 2022-01-08 DIAGNOSIS — G3184 Mild cognitive impairment, so stated: Secondary | ICD-10-CM | POA: Diagnosis not present

## 2022-01-08 DIAGNOSIS — E1165 Type 2 diabetes mellitus with hyperglycemia: Secondary | ICD-10-CM | POA: Diagnosis present

## 2022-01-08 DIAGNOSIS — I2510 Atherosclerotic heart disease of native coronary artery without angina pectoris: Secondary | ICD-10-CM | POA: Diagnosis not present

## 2022-01-08 DIAGNOSIS — G4700 Insomnia, unspecified: Secondary | ICD-10-CM | POA: Diagnosis not present

## 2022-01-08 DIAGNOSIS — G47419 Narcolepsy without cataplexy: Secondary | ICD-10-CM | POA: Diagnosis present

## 2022-01-08 DIAGNOSIS — E669 Obesity, unspecified: Secondary | ICD-10-CM | POA: Diagnosis present

## 2022-01-08 DIAGNOSIS — Z87891 Personal history of nicotine dependence: Secondary | ICD-10-CM

## 2022-01-08 DIAGNOSIS — I5032 Chronic diastolic (congestive) heart failure: Secondary | ICD-10-CM | POA: Diagnosis present

## 2022-01-08 DIAGNOSIS — E1169 Type 2 diabetes mellitus with other specified complication: Secondary | ICD-10-CM | POA: Diagnosis present

## 2022-01-08 DIAGNOSIS — I951 Orthostatic hypotension: Secondary | ICD-10-CM | POA: Diagnosis not present

## 2022-01-08 DIAGNOSIS — G4752 REM sleep behavior disorder: Secondary | ICD-10-CM | POA: Diagnosis present

## 2022-01-08 DIAGNOSIS — G20A1 Parkinson's disease without dyskinesia, without mention of fluctuations: Secondary | ICD-10-CM | POA: Diagnosis present

## 2022-01-08 DIAGNOSIS — I110 Hypertensive heart disease with heart failure: Secondary | ICD-10-CM | POA: Diagnosis present

## 2022-01-08 DIAGNOSIS — G4733 Obstructive sleep apnea (adult) (pediatric): Secondary | ICD-10-CM

## 2022-01-08 DIAGNOSIS — G20 Parkinson's disease: Secondary | ICD-10-CM | POA: Diagnosis present

## 2022-01-08 DIAGNOSIS — Z950 Presence of cardiac pacemaker: Secondary | ICD-10-CM | POA: Diagnosis present

## 2022-01-08 DIAGNOSIS — E8809 Other disorders of plasma-protein metabolism, not elsewhere classified: Secondary | ICD-10-CM | POA: Diagnosis present

## 2022-01-08 DIAGNOSIS — I10 Essential (primary) hypertension: Secondary | ICD-10-CM

## 2022-01-08 LAB — GLUCOSE, CAPILLARY
Glucose-Capillary: 201 mg/dL — ABNORMAL HIGH (ref 70–99)
Glucose-Capillary: 166 mg/dL — ABNORMAL HIGH (ref 70–99)
Glucose-Capillary: 184 mg/dL — ABNORMAL HIGH (ref 70–99)
Glucose-Capillary: 157 mg/dL — ABNORMAL HIGH (ref 70–99)
Glucose-Capillary: 157 mg/dL — ABNORMAL HIGH (ref 70–99)
Glucose-Capillary: 173 mg/dL — ABNORMAL HIGH (ref 70–99)

## 2022-01-08 MED ORDER — HYDRALAZINE HCL 10 MG PO TABS: 10.0000 mg | ORAL_TABLET | Freq: Two times a day (BID) | ORAL | Status: DC

## 2022-01-08 MED ORDER — ENOXAPARIN SODIUM 40 MG/0.4ML IJ SOSY: 40.0000 mg | PREFILLED_SYRINGE | INTRAMUSCULAR | Status: DC

## 2022-01-08 MED ORDER — HYDRALAZINE HCL 10 MG PO TABS
10.0000 mg | ORAL_TABLET | Freq: Two times a day (BID) | ORAL | Status: DC
  Administered 2022-01-08 – 2022-01-13 (×10): 10 mg via ORAL
  Filled 2022-01-08 (×11): qty 1

## 2022-01-08 MED ORDER — FUROSEMIDE 20 MG PO TABS: 20.0000 mg | ORAL_TABLET | Freq: Every day | ORAL | 0 refills | Status: DC

## 2022-01-08 MED ORDER — VITAMIN D (ERGOCALCIFEROL) 1.25 MG (50000 UNIT) PO CAPS
50000.0000 [IU] | ORAL_CAPSULE | ORAL | Status: DC
Start: 2022-01-09 — End: 2022-01-18
  Administered 2022-01-09 – 2022-01-16 (×2): 50000 [IU] via ORAL
  Filled 2022-01-08 (×2): qty 1

## 2022-01-08 MED ORDER — GUAIFENESIN-DM 100-10 MG/5ML PO SYRP: 5.0000 mL | ORAL_SOLUTION | Freq: Four times a day (QID) | ORAL | Status: DC | PRN

## 2022-01-08 MED ORDER — ALUM & MAG HYDROXIDE-SIMETH 200-200-20 MG/5ML PO SUSP
30.0000 mL | ORAL | Status: DC | PRN
  Administered 2022-01-10: 30 mL via ORAL
  Filled 2022-01-08: qty 30

## 2022-01-08 MED ORDER — DIPHENHYDRAMINE HCL 12.5 MG/5ML PO ELIX: 12.5000 mg | ORAL_SOLUTION | Freq: Four times a day (QID) | ORAL | Status: DC | PRN

## 2022-01-08 MED ORDER — TRAMADOL HCL 50 MG PO TABS
50.0000 mg | ORAL_TABLET | Freq: Four times a day (QID) | ORAL | Status: DC | PRN
  Administered 2022-01-11 – 2022-01-13 (×2): 50 mg via ORAL
  Filled 2022-01-08 (×3): qty 1

## 2022-01-08 MED ORDER — PROSOURCE PLUS PO LIQD
30.0000 mL | Freq: Two times a day (BID) | ORAL | Status: DC
  Administered 2022-01-08 – 2022-01-09 (×3): 30 mL via ORAL
  Filled 2022-01-08 (×3): qty 30

## 2022-01-08 MED ORDER — CARBIDOPA-LEVODOPA ER 25-100 MG PO TBCR
1.0000 | EXTENDED_RELEASE_TABLET | Freq: Every day | ORAL | Status: DC
  Administered 2022-01-08 – 2022-01-17 (×10): 1 via ORAL
  Filled 2022-01-08 (×10): qty 1

## 2022-01-08 MED ORDER — DOCUSATE SODIUM 100 MG PO CAPS
100.0000 mg | ORAL_CAPSULE | Freq: Two times a day (BID) | ORAL | Status: DC
  Administered 2022-01-08: 100 mg via ORAL
  Filled 2022-01-08: qty 1

## 2022-01-08 MED ORDER — POLYETHYLENE GLYCOL 3350 17 G PO PACK: 17.0000 g | PACK | Freq: Every day | ORAL | Status: DC | PRN

## 2022-01-08 MED ORDER — INSULIN ASPART 100 UNIT/ML IJ SOLN
0.0000 [IU] | Freq: Every day | INTRAMUSCULAR | Status: DC
  Administered 2022-01-09 – 2022-01-12 (×2): 2 [IU] via SUBCUTANEOUS
  Administered 2022-01-16 – 2022-01-17 (×2): 3 [IU] via SUBCUTANEOUS

## 2022-01-08 MED ORDER — SENNOSIDES-DOCUSATE SODIUM 8.6-50 MG PO TABS
2.0000 | ORAL_TABLET | Freq: Every day | ORAL | Status: DC
  Administered 2022-01-09 – 2022-01-17 (×9): 2 via ORAL
  Filled 2022-01-08 (×9): qty 2

## 2022-01-08 MED ORDER — METOPROLOL TARTRATE 25 MG PO TABS
25.0000 mg | ORAL_TABLET | Freq: Two times a day (BID) | ORAL | Status: DC
  Administered 2022-01-08 – 2022-01-18 (×20): 25 mg via ORAL
  Filled 2022-01-08 (×20): qty 1

## 2022-01-08 MED ORDER — PROCHLORPERAZINE EDISYLATE 10 MG/2ML IJ SOLN: 5.0000 mg | Freq: Four times a day (QID) | INTRAMUSCULAR | Status: DC | PRN

## 2022-01-08 MED ORDER — ADULT MULTIVITAMIN W/MINERALS CH: 1.0000 | ORAL_TABLET | Freq: Every day | ORAL | Status: DC

## 2022-01-08 MED ORDER — METFORMIN HCL 500 MG PO TABS
1000.0000 mg | ORAL_TABLET | Freq: Every day | ORAL | Status: DC
  Administered 2022-01-09 – 2022-01-18 (×10): 1000 mg via ORAL
  Filled 2022-01-08 (×10): qty 2

## 2022-01-08 MED ORDER — GLUCERNA SHAKE PO LIQD
237.0000 mL | Freq: Three times a day (TID) | ORAL | 0 refills | Status: DC
Start: 2022-01-08 — End: 2022-01-18

## 2022-01-08 MED ORDER — DOCUSATE SODIUM 100 MG PO CAPS: 100.0000 mg | ORAL_CAPSULE | Freq: Two times a day (BID) | ORAL | 0 refills | Status: DC

## 2022-01-08 MED ORDER — FUROSEMIDE 20 MG PO TABS
20.0000 mg | ORAL_TABLET | Freq: Every day | ORAL | Status: DC
  Administered 2022-01-09 – 2022-01-18 (×10): 20 mg via ORAL
  Filled 2022-01-08 (×10): qty 1

## 2022-01-08 MED ORDER — ACETAMINOPHEN 325 MG PO TABS
325.0000 mg | ORAL_TABLET | ORAL | Status: DC | PRN
  Administered 2022-01-09: 650 mg via ORAL
  Filled 2022-01-08 (×2): qty 2

## 2022-01-08 MED ORDER — PROCHLORPERAZINE 25 MG RE SUPP
12.5000 mg | Freq: Four times a day (QID) | RECTAL | Status: DC | PRN
Start: 2022-01-08 — End: 2022-01-18

## 2022-01-08 MED ORDER — ATORVASTATIN CALCIUM 10 MG PO TABS
20.0000 mg | ORAL_TABLET | Freq: Every day | ORAL | Status: DC
  Administered 2022-01-09 – 2022-01-17 (×9): 20 mg via ORAL
  Filled 2022-01-08 (×9): qty 2

## 2022-01-08 MED ORDER — FLEET ENEMA 7-19 GM/118ML RE ENEM: 1.0000 | ENEMA | Freq: Once | RECTAL | Status: DC | PRN

## 2022-01-08 MED ORDER — INSULIN ASPART 100 UNIT/ML IJ SOLN
0.0000 [IU] | Freq: Three times a day (TID) | INTRAMUSCULAR | Status: DC
  Administered 2022-01-08: 2 [IU] via SUBCUTANEOUS
  Administered 2022-01-09: 3 [IU] via SUBCUTANEOUS
  Administered 2022-01-09: 2 [IU] via SUBCUTANEOUS
  Administered 2022-01-09: 3 [IU] via SUBCUTANEOUS
  Administered 2022-01-10 (×3): 2 [IU] via SUBCUTANEOUS
  Administered 2022-01-11: 3 [IU] via SUBCUTANEOUS
  Administered 2022-01-11 (×2): 2 [IU] via SUBCUTANEOUS
  Administered 2022-01-12: 1 [IU] via SUBCUTANEOUS
  Administered 2022-01-12 – 2022-01-14 (×6): 2 [IU] via SUBCUTANEOUS
  Administered 2022-01-14 (×2): 1 [IU] via SUBCUTANEOUS
  Administered 2022-01-15 (×2): 2 [IU] via SUBCUTANEOUS
  Administered 2022-01-15: 1 [IU] via SUBCUTANEOUS
  Administered 2022-01-16 – 2022-01-18 (×8): 2 [IU] via SUBCUTANEOUS

## 2022-01-08 MED ORDER — POLYETHYLENE GLYCOL 3350 17 G PO PACK
17.0000 g | PACK | Freq: Every day | ORAL | Status: DC
  Administered 2022-01-09 – 2022-01-17 (×9): 17 g via ORAL
  Filled 2022-01-08 (×10): qty 1

## 2022-01-08 MED ORDER — CARBIDOPA-LEVODOPA 25-100 MG PO TABS
1.0000 | ORAL_TABLET | Freq: Three times a day (TID) | ORAL | Status: DC
  Administered 2022-01-08 – 2022-01-18 (×30): 1 via ORAL
  Filled 2022-01-08 (×32): qty 1

## 2022-01-08 MED ORDER — JUVEN PO PACK
1.0000 | PACK | Freq: Two times a day (BID) | ORAL | Status: DC
Start: 2022-01-08 — End: 2022-01-18
  Administered 2022-01-08 – 2022-01-18 (×19): 1 via ORAL
  Filled 2022-01-08 (×18): qty 1

## 2022-01-08 MED ORDER — OMEGA-3-ACID ETHYL ESTERS 1 G PO CAPS
1.0000 g | ORAL_CAPSULE | Freq: Every day | ORAL | Status: DC
  Administered 2022-01-09 – 2022-01-18 (×10): 1 g via ORAL
  Filled 2022-01-08 (×10): qty 1

## 2022-01-08 MED ORDER — INSULIN ASPART 100 UNIT/ML IJ SOLN
0.0000 [IU] | Freq: Three times a day (TID) | INTRAMUSCULAR | 11 refills | Status: DC
Start: 2022-01-08 — End: 2022-01-18

## 2022-01-08 MED ORDER — AMOXICILLIN-POT CLAVULANATE 875-125 MG PO TABS
1.0000 | ORAL_TABLET | Freq: Two times a day (BID) | ORAL | 0 refills | Status: DC
Start: 2022-01-08 — End: 2022-01-18

## 2022-01-08 MED ORDER — ACETAMINOPHEN 325 MG PO TABS
650.0000 mg | ORAL_TABLET | Freq: Two times a day (BID) | ORAL | Status: DC
  Administered 2022-01-08 – 2022-01-09 (×2): 650 mg via ORAL
  Filled 2022-01-08 (×2): qty 2

## 2022-01-08 MED ORDER — ADULT MULTIVITAMIN W/MINERALS CH
1.0000 | ORAL_TABLET | Freq: Every day | ORAL | Status: DC
  Administered 2022-01-09 – 2022-01-18 (×10): 1 via ORAL
  Filled 2022-01-08 (×10): qty 1

## 2022-01-08 MED ORDER — AMOXICILLIN-POT CLAVULANATE 875-125 MG PO TABS
1.0000 | ORAL_TABLET | Freq: Two times a day (BID) | ORAL | Status: AC
  Administered 2022-01-08 – 2022-01-12 (×8): 1 via ORAL
  Filled 2022-01-08 (×8): qty 1

## 2022-01-08 MED ORDER — VITAMIN D 25 MCG (1000 UNIT) PO TABS
1000.0000 [IU] | ORAL_TABLET | Freq: Every day | ORAL | Status: DC
  Administered 2022-01-09 – 2022-01-18 (×10): 1000 [IU] via ORAL
  Filled 2022-01-08 (×10): qty 1

## 2022-01-08 MED ORDER — METFORMIN HCL 1000 MG PO TABS: 1000.0000 mg | ORAL_TABLET | Freq: Every day | ORAL | Status: DC

## 2022-01-08 MED ORDER — NITROGLYCERIN 0.4 MG SL SUBL: 0.4000 mg | SUBLINGUAL_TABLET | SUBLINGUAL | Status: DC | PRN

## 2022-01-08 MED ORDER — POLYVINYL ALCOHOL 1.4 % OP SOLN
1.0000 [drp] | OPHTHALMIC | Status: DC | PRN
  Filled 2022-01-08: qty 15

## 2022-01-08 MED ORDER — BISACODYL 10 MG RE SUPP
10.0000 mg | Freq: Every day | RECTAL | Status: DC | PRN
Start: 2022-01-08 — End: 2022-01-18

## 2022-01-08 MED ORDER — POLYETHYLENE GLYCOL 3350 17 G PO PACK: 17.0000 g | PACK | Freq: Every day | ORAL | 0 refills | Status: DC

## 2022-01-08 MED ORDER — MELATONIN 5 MG PO TABS
10.0000 mg | ORAL_TABLET | Freq: Every day | ORAL | Status: DC
  Administered 2022-01-08 – 2022-01-17 (×10): 10 mg via ORAL
  Filled 2022-01-08 (×10): qty 2

## 2022-01-08 MED ORDER — RISAQUAD PO CAPS
1.0000 | ORAL_CAPSULE | Freq: Every day | ORAL | Status: DC
  Administered 2022-01-09 – 2022-01-18 (×10): 1 via ORAL
  Filled 2022-01-08 (×10): qty 1

## 2022-01-08 MED ORDER — PROCHLORPERAZINE MALEATE 5 MG PO TABS: 5.0000 mg | ORAL_TABLET | Freq: Four times a day (QID) | ORAL | Status: DC | PRN

## 2022-01-08 MED ORDER — ENSURE MAX PROTEIN PO LIQD
11.0000 [oz_av] | Freq: Three times a day (TID) | ORAL | Status: DC
  Administered 2022-01-08 – 2022-01-12 (×11): 11 [oz_av] via ORAL
  Filled 2022-01-08 (×14): qty 330

## 2022-01-08 MED ORDER — ENOXAPARIN SODIUM 40 MG/0.4ML IJ SOSY
40.0000 mg | PREFILLED_SYRINGE | INTRAMUSCULAR | Status: DC
  Administered 2022-01-08 – 2022-01-17 (×10): 40 mg via SUBCUTANEOUS
  Filled 2022-01-08 (×10): qty 0.4

## 2022-01-08 MED ORDER — QUETIAPINE FUMARATE 25 MG PO TABS
12.5000 mg | ORAL_TABLET | Freq: Every day | ORAL | Status: DC
  Administered 2022-01-08 – 2022-01-17 (×10): 12.5 mg via ORAL
  Filled 2022-01-08 (×10): qty 1

## 2022-01-08 NOTE — Progress Notes (Signed)
Inpatient Diabetes Program Recommendations ? ?AACE/ADA: New Consensus Statement on Inpatient Glycemic Control (2015) ? ?Target Ranges:  Prepandial:   less than 140 mg/dL ?     Peak postprandial:   less than 180 mg/dL (1-2 hours) ?     Critically ill patients:  140 - 180 mg/dL  ? ?Lab Results  ?Component Value Date  ? GLUCAP 166 (H) 01/08/2022  ? HGBA1C 6.1 (H) 12/31/2021  ? ? ?Review of Glycemic Control ? Latest Reference Range & Units 01/07/22 08:08 01/07/22 12:16 01/07/22 17:32 01/07/22 21:43 01/08/22 07:35  ?Glucose-Capillary 70 - 99 mg/dL 201 (H) 238 (H) 167 (H) 255 (H) 166 (H)  ? ?Diabetes history: DM 2 ?Outpatient Diabetes medications: Metformin 2000 mg with breakfast ?Current orders for Inpatient glycemic control:  ?Novolog moderate tid with meals  ?Metformin 1000 mg with breakfast ?Inpatient Diabetes Program Recommendations:   ?May consider adding Semglee 10 units daily.  ? ?Thanks,  ?Adah Perl, RN, BC-ADM ?Inpatient Diabetes Coordinator ?Pager [phone]  (8a-5p) ? ? ? ?

## 2022-01-08 NOTE — Progress Notes (Signed)
[facility] IP rehab admissions - Dr. Antonieta Pert was able to do the peer to peer review and we now have approval for acute inpatient rehab admission.  Will admit to CIR at [facility] today.  Call me for questions.  [phone] ?

## 2022-01-08 NOTE — Progress Notes (Signed)
Inpatient Rehabilitation Admission Medication Review by a Pharmacist ? ?A complete drug regimen review was completed for this patient to identify any potential clinically significant medication issues. ? ?High Risk Drug Classes Is patient taking? Indication by Medication  ?Antipsychotic Yes Compazine prn N/V ?Seroquel for PD, agitation, delirium  ?Anticoagulant Yes Lovenox for VTE ppx  ?Antibiotic Yes Po Augmentin to complete course for diverticulitis  ?Opioid No   ?Antiplatelet No   ?Hypoglycemics/insulin Yes SSI, metformin for DM  ?Vasoactive Medication Yes Furosemide for fluid, Hydralazine, metoprolol for BP  ?Chemotherapy No   ?Other Yes Sinemet for PD ?Atorvastatin for HLD  ? ? ? ?Type of Medication Issue Identified Description of Issue Recommendation(s)  ?Drug Interaction(s) (clinically significant) ?    ?Duplicate Therapy ?    ?Allergy ?    ?No Medication Administration End Date ?    ?Incorrect Dose ?    ?Additional Drug Therapy Needed ?    ?Significant med changes from prior encounter (inform family/care partners about these prior to discharge).    ?Other ?    ? ? ?Clinically significant medication issues were identified that warrant physician communication and completion of prescribed/recommended actions by midnight of the next day:  No ? ?Pharmacist comments: None ? ?Time spent performing this drug regimen review (minutes):  20 minutes ? ? ?Tad Moore ?01/08/2022 2:56 PM ?

## 2022-01-08 NOTE — Progress Notes (Signed)
Patient admitted to 5C-08 at 1430. Patient oriented to unit/facility. At time of assessment patient able to answer most admission questions. Patient noted to be Aox2-3. Notable non-productive cough. Denies SOB or chest pain. CPAP brought in by wife. Skin assessment with blanchable redness to sacrum. Barrier cream at bedside. Patient denies pain. Currently resting in bed at this time. Bed set to the lowest setting. Call bell within reach. Wife at bedside.  ?

## 2022-01-08 NOTE — H&P (Shared)
Physical Medicine and Rehabilitation Admission H&P    Chief Complaint  Patient presents with   Functional deficits    HPI: Jordan [name] is an 81 year old male with history of CAD/chronic diastolic CHF, OSA, S3MH, gout, Parkinson's diease with recent admission 02/17-02-22-23 worsening of gait disorder with falls with hallucinations which was treated with medication adjustment and he was discharged to home. He was readmitted on 12/30/21 with abdominal pain with low grade fever, leucocytosis, lactic acidosis, mild confusion and lethargy secondary to sepsis. He was found to have elevated lipase-165  with inflammatory changes suggestive of primary pancreatitis with secondary inflammation of hepatic flexure of colon or primary sigmoid diverticulitis with secondary extension to pancreatic tail. He was treated with IVF and broad spectrum antibiotics.  He was found to have oropharyngeal dysphagia exacerbated by fluctuating mentation and diet has been adjusted depending on alertness.    Abdominal pain resolved with improvement in agitation and downward trend in leucocytosis therefore antibiotics narrowed to Augmentin. MBS done 03/06 and he was noted to have mild to moderate oral and mild pharyngeal dysphagia-->on Regular, thins with aspiration precautions.  He did develop volume overload with respiratory symptoms on 03/05 requiring IV diuresis. 2D echo done revealing EF 60-65% with mild concentric LVH and trivial MVR. Po intake remains poor requiring multiple supplements. Metformin resumed due to upward trend in BS. Therapy ongoing and patient continues to be limited by disorientation with delay in processing, weakness, poor awareness of deficits and decreased activity tolerance. CIR recommended due to functional decline.    Review of Systems  Constitutional:  Negative for chills and fever.  HENT:  Negative for hearing loss and tinnitus.   Eyes:  Positive for double vision.  Respiratory:  Positive  for cough (chronic intermittent).   Cardiovascular:  Negative for chest pain and palpitations.  Gastrointestinal:  Negative for constipation and heartburn.  Genitourinary:  Negative for frequency and urgency.  Musculoskeletal:  Positive for falls (tends to fall backwards). Negative for back pain.  Neurological:  Positive for dizziness (with positional changes occasionally). Negative for headaches.  Psychiatric/Behavioral:  Positive for memory loss. The patient does not have insomnia.     Past Medical History:  Diagnosis Date   Arthritis    R shoulder, bone spur   Arthritis    "hands" (03/25/2016)   Chronic diastolic heart failure (HCC)    Chronic lower back pain    Coronary heart disease    Dr Pernell Dupre   H/O cardiovascular stress test    perhaps last one was 2009   Heart murmur    12/29/11 echo: mild MR, no AS, trivial TR   History of gout    Hyperlipidemia    Hypertension    saw Dr. Linard Millers last earl;y- 2014, cardiac cath. last done ?2009, blocks seen didn't require any intervention at that point.   Narcolepsy    [name] 04-11-97; Mean Latency 1.37mn, SOREM 2   OSA on CPAP    NPSG 12-13-98 AHI 22.7   PONV (postoperative nausea and vomiting)    Presence of permanent cardiac pacemaker    Shortness of breath    with exertion    [place]    Type II diabetes mellitus ([place]     Past Surgical History:  Procedure Laterality Date   CARDIAC CATHETERIZATION  2009?   EP IMPLANTABLE DEVICE N/A 03/25/2016   Procedure: Pacemaker Implant - Dual Chamber;  Surgeon: GEvans Lance MD;  [facility];  Service: Cardiovascular;  Laterality: N/A;   FRACTURE SURGERY     INSERT / REPLACE / REMOVE PACEMAKER  03/24/2016   IR KYPHO LUMBAR INC FX REDUCE BONE BX UNI/BIL CANNULATION INC/IMAGING  12/29/2021   IR RADIOLOGIST EVAL & MGMT  12/15/2021   LEFT AND RIGHT HEART CATHETERIZATION WITH CORONARY ANGIOGRAM N/A 06/05/2014   Procedure: LEFT AND RIGHT HEART CATHETERIZATION WITH CORONARY  ANGIOGRAM;  Surgeon: Sinclair Grooms, MD;  Location: [facility] CATH LAB;  Service: Cardiovascular;  Laterality: N/A;   NASAL FRACTURE SURGERY     SHOULDER ARTHROSCOPY WITH ROTATOR CUFF REPAIR AND SUBACROMIAL DECOMPRESSION Right 08/16/2013   Procedure: SHOULDER ARTHROSCOPY WITH ROTATOR CUFF REPAIR AND SUBACROMIAL DECOMPRESSION;  Surgeon: [name], MD;  Location: [place];  Service: Orthopedics;  Laterality: Right;  Right shoulder arthroscopy rotator cuff repair, subacromial decompression.   TONSILLECTOMY      Family History  Problem Relation Age of Onset   Sleep apnea Sister    Colon cancer Sister     Social History:  reports that he quit smoking about 54 years ago. His smoking use included cigarettes. He has a 15.00 pack-year smoking history. He has never used smokeless tobacco. He reports current alcohol use. He reports that he does not use drugs.   Allergies: No Known Allergies   Medications Prior to Admission  Medication Sig Dispense Refill   acetaminophen (TYLENOL) 500 MG tablet Take 1,000 mg by mouth 2 (two) times daily.     amphetamine-dextroamphetamine (ADDERALL) 10 MG tablet Take 10 mg by mouth daily as needed (help control symptoms of parkinsons).     atorvastatin (LIPITOR) 40 MG tablet Take 0.5 tablets (20 mg total) by mouth daily. 30 tablet 0   bifidobacterium infantis (ALIGN) capsule Take 1 capsule by mouth daily.     carbidopa-levodopa (SINEMET) 25-100 MG tablet Take 1 tablet by mouth 3 (three) times daily. 90 tablet 5   Carbidopa-Levodopa ER (SINEMET CR) 25-100 MG tablet controlled release Take 1 tablet by mouth at bedtime.     cholecalciferol (VITAMIN D3) 25 MCG (1000 UNIT) tablet Take 1,000 Units by mouth daily.     Coenzyme Q10 (CO Q-10 PO) Take 1 capsule by mouth daily.     lidocaine (LIDODERM) 5 % Place 1 patch onto the skin in the morning and at bedtime.     lisinopril (ZESTRIL) 20 MG tablet Take 20 mg by mouth daily.     Melatonin 10 MG TABS Take 10 mg by  mouth at bedtime.     metFORMIN (GLUCOPHAGE) 500 MG tablet Take 2,000 mg by mouth daily with breakfast. 4 tables once a day     metoprolol tartrate (LOPRESSOR) 25 MG tablet Take 1 tablet (25 mg total) by mouth 2 (two) times daily. 60 tablet 0   Multiple Vitamin (MULTIVITAMIN WITH MINERALS) TABS tablet Take 1 tablet by mouth daily.     NITROSTAT 0.4 MG SL tablet PLACE 1 TABLET (0.4 MG TOTAL) UNDER THE TONGUE EVERY 5 (FIVE) MINUTES AS NEEDED FOR CHEST PAIN. (Patient taking differently: Place 0.4 mg under the tongue every 5 (five) minutes as needed for chest pain.) 25 tablet 5   Omega 3 1200 MG CAPS Take 1,200 mg by mouth daily.     QUEtiapine (SEROQUEL) 25 MG tablet Take 1/2 tablets (12.5 mg total) by mouth at bedtime. 15 tablet 0   traMADol (ULTRAM) 50 MG tablet Take 50 mg by mouth every 6 (six) hours as needed for moderate pain (lower back pain).  PRESCRIPTION MEDICATION See admin instructions. CPAP- At bedtime        Home: Home Living Family/patient expects to be discharged to:: Private residence Living Arrangements: Spouse/significant other, Children Available Help at Discharge: Family Type of Home: Apartment Home Access: Elevator Home Layout: One level Bathroom Shower/Tub: Public librarian, Multimedia programmer: Handicapped height Home Equipment: Civil engineer, contracting, Conservation officer, nature (2 wheels) Additional Comments: no family present during this OT eval and pt able to provide limited PLOF   Functional History: Prior Function Prior Level of Function : Needs assist Physical Assist : Mobility (physical), ADLs (physical) Mobility (physical): Transfers, Gait ADLs (physical): Bathing, Dressing, Toileting, IADLs Mobility Comments: Wife has had to provide more physical assist here lately with mobility; uses SPC (per OT note 12/21/21) ADLs Comments: Wife providing assist for all tasks per pt report set up for grooming tasks and self feeding.TD for LB dressibng/bathing tasks  Functional  Status:  Mobility: Bed Mobility Overal bed mobility: Needs Assistance Bed Mobility: Rolling, Sidelying to Sit Rolling: Min assist Sidelying to sit: Min assist, HOB elevated Supine to sit: Max assist Sit to supine: Max assist, +2 for safety/equipment General bed mobility comments: assist for log roll to EOB, trunk elevation via HHA. Increased time and sequencing cues throughout Transfers Overall transfer level: Needs assistance Equipment used: Rolling walker (2 wheels) Transfers: Sit to/from Stand Sit to Stand: Min assist Bed to/from chair/wheelchair/BSC transfer type:: Stand pivot Stand pivot transfers: Min assist, From elevated surface General transfer comment: light power up and steadying assist, STS x4, from EOB x2 and recliner x2. Ambulation/Gait Ambulation/Gait assistance: Min assist, +2 safety/equipment (chair follow) Gait Distance (Feet): 40 Feet (+25, seated rest break needed) Assistive device: Rolling walker (2 wheels) Gait Pattern/deviations: Step-through pattern, Decreased stride length, Trunk flexed General Gait Details: min steadying assist, cues for upright posture, placement in RW Gait velocity: decr Pre-gait activities: Lateral weight shifting, standing marches, lateral side stepping along EOB    ADL: ADL Overall ADL's : Needs assistance/impaired Eating/Feeding: Minimal assistance, Bed level Eating/Feeding Details (indicate cue type and reason): increased time to process bringing cup to mouth of thickned liquids with cues for small sips as per SLP recommendations Grooming: Minimal assistance, Set up, Sitting, Standing Grooming Details (indicate cue type and reason): Patient with kyphotic posture over the sink neding max multimodal cues to try and correct. Difficulty brushing teeth in standing position needing to sit to complete due to standing fatigue and balance. Upper Body Bathing: Maximal assistance, Bed level Lower Body Bathing: Total assistance, Bed  level Upper Body Dressing : Maximal assistance, Bed level Lower Body Dressing: Bed level, Total assistance Toilet Transfer: Moderate assistance, Cueing for safety, Rolling walker (2 wheels) Toilet Transfer Details (indicate cue type and reason): Patient is able to power up to standing at min A level however is unsteady with posterior bias needing cues to correct posture. Knees also buckling at times needing mod A to safely perform transfers. Toileting- Clothing Manipulation and Hygiene: Total assistance, Bed level Toileting - Clothing Manipulation Details (indicate cue type and reason): Condom cath fell off during treatment, CNA notified. Functional mobility during ADLs: Moderate assistance, Cueing for safety, Rolling walker (2 wheels)  Cognition: Cognition Overall Cognitive Status: Impaired/Different from baseline Orientation Level: Oriented to person, Oriented to place, Disoriented to time, Disoriented to situation Cognition Arousal/Alertness: Awake/alert Behavior During Therapy: WFL for tasks assessed/performed Overall Cognitive Status: Impaired/Different from baseline Area of Impairment: Orientation, Following commands, Safety/judgement, Problem solving Orientation Level: Disoriented to, Situation Following Commands: Follows one  step commands consistently, Follows one step commands with increased time Safety/Judgement: Decreased awareness of deficits, Decreased awareness of safety Problem Solving: Slow processing, Requires verbal cues, Requires tactile cues General Comments: pt initially alseep upon arrival to room, initially confused about PT arrival but after second explanation understands. Requires cuing for form/safety throughout mobility, enjoys making jokes during session   Blood pressure 113/76, pulse (!) 59, temperature 98.7 F (37.1 C), resp. rate 18, height '5\' 8"'$  (1.727 m), weight 94.5 kg, SpO2 95 %. Physical Exam Vitals and nursing note reviewed.  Constitutional:       Appearance: He is well-developed.  Genitourinary:    Testes:        Right: Swelling not present.     Comments: Condom cath in place Neurological:     Mental Status: He is alert.     Comments: Oriented to self, place and date--situation "falls". Able to follow simple motor commands without difficulty.    Results for orders placed or performed during the hospital encounter of 12/30/21 (from the past 48 hour(s))  Glucose, capillary     Status: Abnormal   Collection Time: 01/06/22 12:20 PM  Result Value Ref Range   Glucose-Capillary 182 (H) 70 - 99 mg/dL    Comment: Glucose reference range applies only to samples taken after fasting for at least 8 hours.  Glucose, capillary     Status: Abnormal   Collection Time: 01/06/22  4:52 PM  Result Value Ref Range   Glucose-Capillary 199 (H) 70 - 99 mg/dL    Comment: Glucose reference range applies only to samples taken after fasting for at least 8 hours.  Glucose, capillary     Status: Abnormal   Collection Time: 01/06/22  8:20 PM  Result Value Ref Range   Glucose-Capillary 213 (H) 70 - 99 mg/dL    Comment: Glucose reference range applies only to samples taken after fasting for at least 8 hours.  CBC     Status: Abnormal   Collection Time: 01/07/22  5:02 AM  Result Value Ref Range   WBC 13.8 (H) 4.0 - 10.5 K/uL   RBC 4.31 4.22 - 5.81 MIL/uL   Hemoglobin 13.0 13.0 - 17.0 g/dL   HCT 38.4 (L) 39.0 - 52.0 %   MCV 89.1 80.0 - 100.0 fL   MCH 30.2 26.0 - 34.0 pg   MCHC 33.9 30.0 - 36.0 g/dL   RDW 13.2 11.5 - 15.5 %   Platelets 330 150 - 400 K/uL   nRBC 0.0 0.0 - 0.2 %    Comment: Performed at [facility], [place] [address], [place], [place] [address]  Comprehensive metabolic panel     Status: Abnormal   Collection Time: 01/07/22  5:02 AM  Result Value Ref Range   Sodium 137 135 - 145 mmol/L   Potassium 3.6 3.5 - 5.1 mmol/L   Chloride 104 98 - 111 mmol/L   CO2 25 22 - 32 mmol/L   Glucose, Bld 162 (H) 70 - 99 mg/dL     Comment: Glucose reference range applies only to samples taken after fasting for at least 8 hours.   BUN 21 8 - 23 mg/dL   Creatinine, Ser 0.79 0.61 - 1.24 mg/dL   Calcium 9.0 8.9 - 10.3 mg/dL   Total Protein 6.5 6.5 - 8.1 g/dL   Albumin 2.9 (L) 3.5 - 5.0 g/dL   AST 21 15 - 41 U/L   ALT 9 0 - 44 U/L   Alkaline Phosphatase 77 38 -  126 U/L   Total Bilirubin 0.3 0.3 - 1.2 mg/dL   GFR, Estimated >60 >60 mL/min    Comment: (NOTE) Calculated using the CKD-EPI Creatinine Equation (2021)    Anion gap 8 5 - 15    Comment: Performed at [facility], [place] [address] [place], [place] [address]  Glucose, capillary     Status: Abnormal   Collection Time: 01/07/22  8:08 AM  Result Value Ref Range   Glucose-Capillary 201 (H) 70 - 99 mg/dL    Comment: Glucose reference range applies only to samples taken after fasting for at least 8 hours.  Glucose, capillary     Status: Abnormal   Collection Time: 01/07/22 12:16 PM  Result Value Ref Range   Glucose-Capillary 238 (H) 70 - 99 mg/dL    Comment: Glucose reference range applies only to samples taken after fasting for at least 8 hours.  Glucose, capillary     Status: Abnormal   Collection Time: 01/07/22  5:32 PM  Result Value Ref Range   Glucose-Capillary 167 (H) 70 - 99 mg/dL    Comment: Glucose reference range applies only to samples taken after fasting for at least 8 hours.  Glucose, capillary     Status: Abnormal   Collection Time: 01/07/22  9:43 PM  Result Value Ref Range   Glucose-Capillary 255 (H) 70 - 99 mg/dL    Comment: Glucose reference range applies only to samples taken after fasting for at least 8 hours.  Glucose, capillary     Status: Abnormal   Collection Time: 01/08/22  7:35 AM  Result Value Ref Range   Glucose-Capillary 166 (H) 70 - 99 mg/dL    Comment: Glucose reference range applies only to samples taken after fasting for at least 8 hours.   Comment 1 Notify RN   Glucose, capillary     Status: Abnormal    Collection Time: 01/08/22 11:50 AM  Result Value Ref Range   Glucose-Capillary 184 (H) 70 - 99 mg/dL    Comment: Glucose reference range applies only to samples taken after fasting for at least 8 hours.   Comment 1 Notify RN    ECHOCARDIOGRAM COMPLETE  Result Date: 01/06/2022    ECHOCARDIOGRAM REPORT   Patient Name:   Jordan [name] Date of Exam: 01/06/2022 Medical Rec #:  [id number]       Height:       68.0 in Accession #:    [id number]      Weight:       208.3 lb Date of Birth:  09-30-1941       BSA:          2.080 m Patient Age:    57 years        BP:           171/60 mmHg Patient Gender: M               HR:           80 bpm. Exam Location:  Inpatient Procedure: 2D Echo Indications:    Dyspnea  History:        Patient has prior history of Echocardiogram examinations, most                 recent 04/15/2020. Stroke, Signs/Symptoms:Murmur and Shortness of                 Breath; Risk Factors:Dyslipidemia, Hypertension and Diabetes.  Sonographer:    Arlyss Gandy Referring Phys: [phone] [facility]  DANIEL ARRIEN IMPRESSIONS  1. Left ventricular ejection fraction, by estimation, is 60 to 65%. The left ventricle has normal function. The left ventricle has no regional wall motion abnormalities. There is mild concentric left ventricular hypertrophy. Left ventricular diastolic parameters are indeterminate.  2. Right ventricular systolic function is low normal. The right ventricular size is normal. There is normal pulmonary artery systolic pressure.  3. Left atrial size was mildly dilated.  4. Right atrial size was mildly dilated.  5. The mitral valve is grossly normal. Trivial mitral valve regurgitation. No evidence of mitral stenosis. Moderate mitral annular calcification.  6. The aortic valve was not well visualized. Aortic valve regurgitation is trivial. Mild aortic valve stenosis.  7. The inferior vena cava is normal in size with <50% respiratory variability, suggesting right atrial pressure of 8 mmHg. Comparison(s):  No significant change from prior study. Conclusion(s)/Recommendation(s): Otherwise normal echocardiogram, with minor abnormalities described in the report. FINDINGS  Left Ventricle: Left ventricular ejection fraction, by estimation, is 60 to 65%. The left ventricle has normal function. The left ventricle has no regional wall motion abnormalities. The left ventricular internal cavity size was normal in size. There is  mild concentric left ventricular hypertrophy. Abnormal (paradoxical) septal motion, consistent with left bundle branch block. Left ventricular diastolic parameters are indeterminate. Right Ventricle: The right ventricular size is normal. Right vetricular wall thickness was not well visualized. Right ventricular systolic function is low normal. There is normal pulmonary artery systolic pressure. The tricuspid regurgitant velocity is 2.01 m/s, and with an assumed right atrial pressure of 8 mmHg, the estimated right ventricular systolic pressure is 23.5 mmHg. Left Atrium: Left atrial size was mildly dilated. Right Atrium: Right atrial size was mildly dilated. Pericardium: There is no evidence of pericardial effusion. Mitral Valve: The mitral valve is grossly normal. There is mild thickening of the mitral valve leaflet(s). There is mild calcification of the mitral valve leaflet(s). Moderate mitral annular calcification. Trivial mitral valve regurgitation. No evidence of mitral valve stenosis. Tricuspid Valve: The tricuspid valve is not well visualized. Tricuspid valve regurgitation is trivial. No evidence of tricuspid stenosis. Aortic Valve: The aortic valve was not well visualized. Aortic valve regurgitation is trivial. Mild aortic stenosis is present. Aortic valve mean gradient measures 7.0 mmHg. Aortic valve peak gradient measures 13.3 mmHg. Aortic valve area, by VTI measures 2.59 cm. Pulmonic Valve: The pulmonic valve was not well visualized. Pulmonic valve regurgitation is not visualized. Aorta: The  aortic root and ascending aorta are structurally normal, with no evidence of dilitation and the aortic arch was not well visualized. Venous: The inferior vena cava is normal in size with less than 50% respiratory variability, suggesting right atrial pressure of 8 mmHg. IAS/Shunts: The atrial septum is grossly normal. Additional Comments: A device lead is visualized in the right ventricle and right atrium.  LEFT VENTRICLE PLAX 2D LVIDd:         3.70 cm   Diastology LVIDs:         2.60 cm   LV e' medial:    7.29 cm/s LV PW:         1.30 cm   LV E/e' medial:  9.5 LV IVS:        1.30 cm   LV e' lateral:   8.27 cm/s LVOT diam:     2.20 cm   LV E/e' lateral: 8.4 LV SV:         81 LV SV Index:   39 LVOT Area:  3.80 cm  RIGHT VENTRICLE             IVC RV Basal diam:  3.70 cm     IVC diam: 2.00 cm RV Mid diam:    3.00 cm RV S prime:     11.30 cm/s TAPSE (M-mode): 1.6 cm LEFT ATRIUM             Index        RIGHT ATRIUM           Index LA Vol (A2C):   47.4 ml 22.79 ml/m  RA Area:     16.60 cm LA Vol (A4C):   44.7 ml 21.50 ml/m  RA Volume:   42.80 ml  20.58 ml/m LA Biplane Vol: 46.4 ml 22.31 ml/m  AORTIC VALVE AV Area (Vmax):    2.56 cm AV Area (Vmean):   2.42 cm AV Area (VTI):     2.59 cm AV Vmax:           182.50 cm/s AV Vmean:          125.500 cm/s AV VTI:            0.313 m AV Peak Grad:      13.3 mmHg AV Mean Grad:      7.0 mmHg LVOT Vmax:         123.00 cm/s LVOT Vmean:        80.000 cm/s LVOT VTI:          0.213 m LVOT/AV VTI ratio: 0.68  AORTA Ao Asc diam: 3.30 cm MITRAL VALVE                TRICUSPID VALVE MV Area (PHT): 2.01 cm     TR Peak grad:   16.2 mmHg MV Decel Time: 377 msec     TR Vmax:        201.00 cm/s MV E velocity: 69.50 cm/s MV A velocity: 125.00 cm/s  SHUNTS MV E/A ratio:  0.56         Systemic VTI:  0.21 m                             Systemic Diam: 2.20 cm Buford Dresser MD Electronically signed by Buford Dresser MD Signature Date/Time: 01/06/2022/4:33:05 PM    Final        Blood pressure 113/76, pulse (!) 59, temperature 98.7 F (37.1 C), resp. rate 18, height '5\' 8"'$  (1.727 m), weight 94.5 kg, SpO2 95 %.  Medical Problem List and Plan: 1. Functional deficits secondary to ***  -patient may *** shower  -ELOS/Goals: *** 2.  Antithrombotics: -DVT/anticoagulation:  Pharmaceutical: Lovenox  -antiplatelet therapy: N/A 3. Pain Management: Tylenol BID decrease to 650 mg bid. Continue prn doses  -no reports of pain.  4. Mood: LCSW to follow for evaluation and support.   -antipsychotic agents: N/A 5. Neuropsych: This patient is not capable of making decisions on his own behalf. 6. Skin/Wound Care: Routine pressure relief measures.  7. Fluids/Electrolytes/Nutrition: Monitor I/O. Check CMET in am 8. Pancreatitis: NO GI symptoms--has resolved.   9. Diverticulitis: Continue Augmentin. On Miralax and senna for bowel regimen.  10 Parkinson's disease: Recent admission for hallucinations  --continue Sinemet IR TID with Sinemet ER at bedtime.  11. CAD?Chronic diastolic CHF: Monitor for symtoms with increase in activity.  --Add low salt restrictions.  --Likely exacerbated by low protein stores. Add juven.  --Monitor daily weights and for signs of overload  --  Transitioned to oral Lasix 03/09.  12. Narcolepsy/OSA: Continue CPAP 13. T2DM: BS poorly controlled likely due to supplements TID BETWEEN meals --Change to ensure Max, add juven and prostat for supplements also.  14. Leucocytosis: Continues to improve. Monitor for fevers and other signs of infection. 10. Constipation: Continue Senna with mirlax. Resume probiotic.       ***  Bary Leriche, PA-C 01/08/2022

## 2022-01-08 NOTE — Discharge Summary (Signed)
Physician Discharge Summary  Jordan [name] BCW:[id number] DOB: Sep 30, 1941 DOA: 12/30/2021  PCP: [name], MD  Admit date: 12/30/2021 Discharge date: 01/08/2022  Admitted From: home Disposition:  CIR  Recommendations for Outpatient Follow-up:  Follow up with PCP in 1-2 weeks Please obtain BMP/CBC in one week  Home Health:none Equipment/Devices:none  Discharge Condition: Stable Code Status:   Code Status: Full Code Diet recommendation:  Diet Order             Diet Carb Modified Fluid consistency: Thin; Room service appropriate? Yes  Diet effective now                 Brief/Interim Summary: 81 yo male with the past medical history of hypertension, T2DM, Parkinson's disease, narcolepsy, OSA and bradycardia sp pacer who presented with abdominal pain. Reported severe abdominal pain, EMS was called and patient was brought to the hospital. Recent hospitalization On 02/17 to 02/22 for hallucinations. 02/28 had kypho-plasty. On his initial physical examination his blood pressure was 176/84, HR 71, RR 18 to 25, 02 saturation 97% and temp 99,2. His lungs were clear to auscultation, heart with S1 and S2 present and rhythmic, abdomen not distended, no lower extremity edema.  Na 137, K 4,9, CL 102, bicarbonate 24, glucose 185, bun 31 and cr 0,97Lactic acid 2,7 Wbc 22.0, hgb 14.7 hvt 43,6 and plt 231 Urine analysis sg 1,026 with negative leukocytes. CT abdomen with new inflammatory change within the pancreatic tail and surrounding mesenteric fat extending to the hepatic flexure of the colon with mild inflammatory changes and mild wall thickening.  Jordan [name] was admitted to the hospital with the working diagnosis of pancreatitis, proximal sigmoid diverticulitis, was placed on antibiotic therapy and IV fluids.3/05 developed volume overload and required diuresis with furosemide. Patient found to have metabolic encephalopathy with delirium. Wife is hoping for CIR-Jordan [name] recommended CIR. Peer to peer  review on 3/10  and approved and is being discharged to inpatient rehab today in medically stable condition.  Discharge Diagnoses:  * Pancreatitis With proximal sigmoid diverticulitis Sepsis POA: Sepsis-resolved,vitals stable. He had significant leukocytosis and now down-trending-tolerating diet, cont augmentin to complete course. Cont Miralax,add coalce. Recent Labs  Lab 01/02/22 0511 01/04/22 0455 01/06/22 0527 01/07/22 0502  WBC 14.3* 13.9* 14.1* 13.8*    Acute metabolic encephalopathy Improved at baseline.  Continue supportive care. CPAP nightly,if recurrent somnolence may need ABG and Bipap,suspected obesity hypoventilation syndrome. Continue to hold on haloperidol, avoid sedative medications.   Essential hypertension Leg edema/volume overload Cough and wheezing. BP is controlled, resp status improving and stable.Continue hydralazine metoprolol.  S/p IV Lasix for edema, echo  W/ EF 60 to 65%, mild concentric LVH, indeterminate LV diastolic function- transitioned to PO lasix.  OSA on CPAP/narcolepsy  Continue with Cpap.   Type 2 diabetes mellitus with hyperlipidemia (HCC) A1c stable at 6.1,CBG with uncontrolled hyperglycemia in 200s, back on Metformin and continue SSI.Continue statin. Recent Labs  Lab 01/07/22 0808 01/07/22 1216 01/07/22 1732 01/07/22 2143 01/08/22 0735  GLUCAP 201* 238* 167* 255* 166*    Parkinson's disease (HCC) Continue his Sinemet,seroquel melatonin continue OOB Jordan [name] OT, supportive care.  Hypomagnesemia Hypokalemia/ hyponatremia:  Improved  Class 1 obesity Calculated BMI is 31.6   Physical deconditioning Continue Jordan [name] OT recommending CIR-P2P initiated for CIR.  Constipation Continue MiraLAX and stool softener.  Consults: Cir   Subjective: Alert awake resting comfortably no complaint complains of constipation Discharge Exam: Vitals:   01/07/22 2252 01/08/22 0437  BP:  113/76  Pulse:  [name])  59  Resp: 17 18  Temp:  98.7 F (37.1 C)   SpO2:  95%   General: Jordan [name] is alert, awake, not in acute distress Cardiovascular: RRR, S1/S2 +, no rubs, no gallops Respiratory: CTA bilaterally, no wheezing, no rhonchi Abdominal: Soft, NT, ND, bowel sounds + Extremities: no edema, no cyanosis  Discharge Instructions  Discharge Instructions     Amb Referral to Palliative Care   Complete by: As directed    Discharge instructions   Complete by: As directed    Check CBC BMP in 1 day   Increase activity slowly   Complete by: As directed       Allergies as of 01/08/2022   No Known Allergies      Medication List     STOP taking these medications    lidocaine 5 % Commonly known as: LIDODERM   lisinopril 20 MG tablet Commonly known as: ZESTRIL       TAKE these medications    acetaminophen 500 MG tablet Commonly known as: TYLENOL Take 1,000 mg by mouth 2 (two) times daily.   amoxicillin-clavulanate 875-125 MG tablet Commonly known as: AUGMENTIN Take 1 tablet by mouth every 12 (twelve) hours for 4 days.   amphetamine-dextroamphetamine 10 MG tablet Commonly known as: ADDERALL Take 10 mg by mouth daily as needed (help control symptoms of parkinsons).   atorvastatin 40 MG tablet Commonly known as: LIPITOR Take 0.5 tablets (20 mg total) by mouth daily.   bifidobacterium infantis capsule Take 1 capsule by mouth daily.   Carbidopa-Levodopa ER 25-100 MG tablet controlled release Commonly known as: SINEMET CR Take 1 tablet by mouth at bedtime.   carbidopa-levodopa 25-100 MG tablet Commonly known as: Sinemet Take 1 tablet by mouth 3 (three) times daily.   cholecalciferol 25 MCG (1000 UNIT) tablet Commonly known as: VITAMIN D3 Take 1,000 Units by mouth daily.   CO Q-10 PO Take 1 capsule by mouth daily.   docusate sodium 100 MG capsule Commonly known as: COLACE Take 1 capsule (100 mg total) by mouth 2 (two) times daily.   feeding supplement (GLUCERNA SHAKE) Liqd Take 237 mLs by mouth 3 (three) times daily  between meals.   furosemide 20 MG tablet Commonly known as: LASIX Take 1 tablet (20 mg total) by mouth daily for 7 days. Start taking on: January 09, 2022   hydrALAZINE 10 MG tablet Commonly known as: APRESOLINE Take 1 tablet (10 mg total) by mouth 2 (two) times daily.   insulin aspart 100 UNIT/ML injection Commonly known as: novoLOG Inject 0-15 Units into the skin 3 (three) times daily with meals.   Melatonin 10 MG Tabs Take 10 mg by mouth at bedtime.   metFORMIN 1000 MG tablet Commonly known as: GLUCOPHAGE Take 1 tablet (1,000 mg total) by mouth daily with breakfast. Start taking on: January 09, 2022 What changed:  medication strength how much to take additional instructions   metoprolol tartrate 25 MG tablet Commonly known as: LOPRESSOR Take 1 tablet (25 mg total) by mouth 2 (two) times daily.   multivitamin with minerals Tabs tablet Take 1 tablet by mouth daily.   Nitrostat 0.4 MG SL tablet Generic drug: nitroGLYCERIN PLACE 1 TABLET (0.4 MG TOTAL) UNDER THE TONGUE EVERY 5 (FIVE) MINUTES AS NEEDED FOR CHEST PAIN. What changed: how much to take   Omega 3 1200 MG Caps Take 1,200 mg by mouth daily.   polyethylene glycol 17 g packet Commonly known as: MIRALAX / GLYCOLAX Take 17 g by mouth daily. Start  taking on: January 09, 2022   PRESCRIPTION MEDICATION See admin instructions. CPAP- At bedtime   QUEtiapine 25 MG tablet Commonly known as: SEROQUEL Take 1/2 tablets (12.5 mg total) by mouth at bedtime.   traMADol 50 MG tablet Commonly known as: ULTRAM Take 50 mg by mouth every 6 (six) hours as needed for moderate pain (lower back pain).        Contact information for after-discharge care     Destination     [facility] Preferred SNF .   Service: Skilled Nursing Contact information: [place] [phone]                    No Known Allergies  The results of significant  diagnostics from this hospitalization (including imaging, microbiology, ancillary and laboratory) are listed below for reference.    Microbiology: Recent Results (from the past 240 hour(s))  Culture, blood (Routine X 2) w Reflex to ID Panel     Status: None   Collection Time: 12/30/21 11:18 AM   Specimen: BLOOD  Result Value Ref Range Status   Specimen Description   Final    BLOOD RIGHT ANTECUBITAL Performed at [facility] [address], [place], [place] [address]    Special Requests   Final    BOTTLES DRAWN AEROBIC AND ANAEROBIC Blood Culture results may not be optimal due to an excessive volume of blood received in culture bottles Performed at [facility] [address], [place], [place] [address]    Culture   Final    NO GROWTH 5 DAYS Performed at [facility], [place] [address] [place], [place] [address]    Report Status 01/04/2022 FINAL  Final  Culture, blood (Routine X 2) w Reflex to ID Panel     Status: None   Collection Time: 12/30/21 11:23 AM   Specimen: BLOOD  Result Value Ref Range Status   Specimen Description   Final    BLOOD LEFT ANTECUBITAL Performed at [facility] [address] [place], [place] [address]    Special Requests   Final    BOTTLES DRAWN AEROBIC AND ANAEROBIC Blood Culture results may not be optimal due to an excessive volume of blood received in culture bottles Performed at [facility] [address], [place], [place] [address]    Culture   Final    NO GROWTH 5 DAYS Performed at [facility], [place] [address], [place], [place] [address]    Report Status 01/04/2022 FINAL  Final  Resp Panel by RT-PCR (Flu A&B, Covid) Nasopharyngeal Swab     Status: None   Collection Time: 12/30/21 11:24 AM   Specimen: Nasopharyngeal Swab; Nasopharyngeal(NP) swabs in vial transport medium  Result Value Ref Range Status   SARS Coronavirus 2 by RT PCR NEGATIVE NEGATIVE Final     Comment: (NOTE) SARS-CoV-2 target nucleic acids are NOT DETECTED.  The SARS-CoV-2 RNA is generally detectable in upper respiratory specimens during the acute phase of infection. The lowest concentration of SARS-CoV-2 viral copies this assay can detect is 138 copies/mL. A negative result does not preclude SARS-Cov-2 infection and should not be used as the sole basis for treatment or other patient management decisions. A negative result may occur with  improper specimen collection/handling, submission of specimen other than nasopharyngeal swab, presence of viral mutation(s) within the areas targeted by this assay, and inadequate number of viral copies(<138 copies/mL). A  negative result must be combined with clinical observations, patient history, and epidemiological information. The expected result is Negative.  Fact Sheet for Patients:  EntrepreneurPulse.com.au  Fact Sheet for Healthcare Providers:  IncredibleEmployment.be  This test is no t yet approved or cleared by the [place] FDA and  has been authorized for detection and/or diagnosis of SARS-CoV-2 by FDA under an Emergency Use Authorization (EUA). This EUA will remain  in effect (meaning this test can be used) for the duration of the COVID-19 declaration under Section 564(b)(1) of the Act, 21 U.S.C.section 360bbb-3(b)(1), unless the authorization is terminated  or revoked sooner.       Influenza A by PCR NEGATIVE NEGATIVE Final   Influenza B by PCR NEGATIVE NEGATIVE Final    Comment: (NOTE) The Xpert Xpress SARS-CoV-2/FLU/RSV plus assay is intended as an aid in the diagnosis of influenza from Nasopharyngeal swab specimens and should not be used as a sole basis for treatment. Nasal washings and aspirates are unacceptable for Xpert Xpress SARS-CoV-2/FLU/RSV testing.  Fact Sheet for Patients: EntrepreneurPulse.com.au  Fact Sheet for Healthcare  Providers: IncredibleEmployment.be  This test is not yet approved or cleared by the [place] FDA and has been authorized for detection and/or diagnosis of SARS-CoV-2 by FDA under an Emergency Use Authorization (EUA). This EUA will remain in effect (meaning this test can be used) for the duration of the COVID-19 declaration under Section 564(b)(1) of the Act, 21 U.S.C. section 360bbb-3(b)(1), unless the authorization is terminated or revoked.  Performed at [facility], [place] [address] [place], [place] [address]   MRSA Next Gen by PCR, Nasal     Status: None   Collection Time: 12/30/21  3:30 PM   Specimen: Nasal Mucosa; Nasal Swab  Result Value Ref Range Status   MRSA by PCR Next Gen NOT DETECTED NOT DETECTED Final    Comment: (NOTE) The GeneXpert MRSA Assay (FDA approved for NASAL specimens only), is one component of a comprehensive MRSA colonization surveillance program. It is not intended to diagnose MRSA infection nor to guide or monitor treatment for MRSA infections. Test performance is not FDA approved in patients less than 44 years old. Performed at [facility], [place] [address] [place], [place] [address]   Urine Culture     Status: None   Collection Time: 12/31/21  6:45 PM   Specimen: Urine, Clean Catch  Result Value Ref Range Status   Specimen Description   Final    URINE, CLEAN CATCH Performed at [facility], [place] [address], [place], [place] [address]    Special Requests   Final    NONE Performed at [facility], [place] [address], [place], [place] [address]    Culture   Final    NO GROWTH Performed at [facility], [place] [address] [place], [place] [address]    Report Status 01/02/2022 FINAL  Final    Procedures/Studies: CT Head Wo Contrast  Result Date: 12/18/2021 CLINICAL DATA:  Neuro deficit, acute stroke suspected. EXAM: CT HEAD WITHOUT CONTRAST  TECHNIQUE: Contiguous axial images were obtained from the base of the skull through the vertex without intravenous contrast. RADIATION DOSE REDUCTION: This exam was performed according to the departmental dose-optimization program which includes automated exposure control, adjustment of the mA and/or kV according to patient size and/or use of iterative reconstruction technique. COMPARISON:  CT head dated June 30, 2021 FINDINGS: Brain: No evidence of acute infarction, hemorrhage, hydrocephalus, extra-axial collection or mass lesion/mass  effect. Moderate cerebral atrophy and chronic microvascular ischemic changes of the white matter, unchanged. Bilateral chronic thalamic lacunar infarcts. Vascular: No hyperdense vessel or unexpected calcification. Skull: Normal. Negative for fracture or focal lesion. Sinuses/Orbits: Partial opacification of the left maxillary sinus, complete opacification of the left frontal sinus and patchy opacification of ethmoid air cells. Other: None. IMPRESSION: 1.  No acute intracranial abnormality. 2. Cerebral atrophy, white matter ischemic changes and chronic lacunar infarcts of bilateral thalami. 3.  Chronic paranasal sinus disease. Electronically Signed   By: Keane Police D.O.   On: 12/18/2021 14:19   MR CERVICAL SPINE WO CONTRAST  Result Date: 12/22/2021 CLINICAL DATA:  Myelopathy, chronic, cervical spine EXAM: MRI CERVICAL SPINE WITHOUT CONTRAST TECHNIQUE: Multiplanar, multisequence MR imaging of the cervical spine was performed. No intravenous contrast was administered. COMPARISON:  None. FINDINGS: Alignment: Reversal of the normal cervical lordosis. Mild retrolisthesis of C5 on C6. Vertebrae: Vertebral body heights are maintained. No focal marrow edema to suggest acute fracture discitis/osteomyelitis. Cord: Normal cord signal. Posterior Fossa, vertebral arteries, paraspinal tissues: Visualized vertebral artery flow voids are maintained. No evidence of acute abnormality in the  visualized posterior fossa on limited assessment. Disc levels: C2-C3: Suspected partial osseous fusion across the disc space. No significant disc protrusion or canal stenosis. Left greater than right facet hypertrophy with mild left foraminal stenosis. Patent right foramen. C3-C4: Suspected partial osseous fusion across the disc space. Bilateral facet and uncovertebral hypertrophy. Mild bilateral foraminal stenosis. No significant canal stenosis. C4-C5: Posterior disc osteophyte complex with bilateral facet and uncovertebral hypertrophy. Resulting mild canal and bilateral foraminal stenosis. C5-C6: Posterior disc osteophyte complex with ligamentum flavum thickening. Resulting mild to moderate canal stenosis and moderate bilateral foraminal stenosis. C6-C7: Small posterior disc osteophyte complex with bilateral facet hypertrophy. Mild left foraminal stenosis without significant canal or right foraminal stenosis. C7-T1: No significant disc protrusion, foraminal stenosis, or canal stenosis. IMPRESSION: 1. 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 2. At C4-C5, mild canal and bilateral foraminal stenosis. 3. Additional mild foraminal stenosis on the left at C2-C3 and C6-C7 and bilaterally at C3-C4. Electronically Signed   By: Margaretha Sheffield M.D.   On: 12/22/2021 14:08   CT Abdomen Pelvis W Contrast  Result Date: 12/30/2021 CLINICAL DATA:  Acute nonlocalized abdominal pain starting yesterday. Had back surgery yesterday started on pain meds. Nausea and vomiting. Has some diarrhea irregularity. EXAM: CT ABDOMEN AND PELVIS WITH CONTRAST TECHNIQUE: Multidetector CT imaging of the abdomen and pelvis was performed using the standard protocol following bolus administration of intravenous contrast. RADIATION DOSE REDUCTION: This exam was performed according to the departmental dose-optimization program which includes automated exposure control, adjustment of the mA and/or kV  according to patient size and/or use of iterative reconstruction technique. CONTRAST:  177m OMNIPAQUE IOHEXOL 300 MG/ML  SOLN COMPARISON:  CT abdomen and pelvis without contrast 10/29/2021; CT abdomen and pelvis without and with contrast 01/23/2014 FINDINGS: Lower chest: Small right and trace left pleural effusions new from 10/29/2021. Associated mild subsegmental atelectasis. Heart size is mildly to moderately enlarged. Coronary artery calcifications and cardiac pacer wires are noted. 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 Hepatobiliary: Smooth liver contours. There are 3 adjacent oval lesions versus a single multilobular lesion within the posterior right hepatic lobe measuring up to approximally 4.2 x 2.3 x 4.2 cm (transverse by AP by craniocaudal), not significantly changed from 10/29/2021 within limitations of lack of IV contrast on the prior study.  In this region there is again a 4 mm dystrophic calcification. Again the calcification is unchanged but the low-density lobular lesion is new compared to 01/23/2014. a more inferior posterior right hepatic lobe 17 mm low-attenuation lesion (axial series 3, image 28) is unchanged from 01/23/2014. More superior anterior right hepatic lobe 10 mm low-attenuation lesion (axial series 3, image 20) is unchanged from 01/23/2014. The gallbladder is unremarkable. No intrahepatic or extrahepatic biliary ductal dilatation. Pancreas: There is new swelling and surrounding moderate inflammatory fat stranding about the pancreatic tail (axial series 3, images 26-33). There is also mild thickening of the adjacent splenic flexure colon in this region. Spleen: Normal in size without focal abnormality. Adrenals/Urinary Tract: Adrenal glands are unremarkable. Mild-to-moderate sigmoid and descending colon diverticulosis. As described above there is inflammatory change in the region of the pancreatic tail and adjacent hepatic flexure of  the colon. The kidneys enhance uniformly and are symmetric in size without hydronephrosis. No renal stone is seen. Posterior inferior right 2.9 cm and mid to superior left 2.4 cm fluid attenuation renal cysts are seen. There are again multiple punctate left-greater-than-right nonobstructing renal stones. Left renal pelvis partially obstructing 12 mm stone is unchanged from 01/23/2014 with unchanged long-term stable minimal left pelvicaliectasis. The urinary bladder is grossly unremarkable, within limitation of underdistention. Stomach/Bowel: No focal bowel wall thickening. The terminal ileum is unremarkable. The appendix appears within normal limits. No dilated loops of bowel to indicate bowel obstruction. Vascular/Lymphatic: No abdominal aortic aneurysm. Moderate atherosclerotic calcification. No mesenteric, retroperitoneal, or pelvic lymphadenopathy. Reproductive: The prostate and seminal vesicles are grossly unremarkable. Other: No abdominal wall hernia or abnormality.No abdominopelvic ascites. No pneumoperitoneum. Musculoskeletal: Mild levocurvature centered at L2. There is new cement seen within L2 vertebral body interval vertebroplasty. Moderate L2 vertebral body height loss is similar to prior. There moderate multilevel degenerative disc changes greatest at L4-5. Mild progressive healing sclerosis of a subacute to older left inferior pubic ramus fracture (axial series 3, image 99). IMPRESSION: Compared to 10/29/2021: 1. Interval L2 vertebral body vertebroplasty. 2. There is new inflammatory change within the pancreatic tail and surrounding mesenteric fat extending to the hepatic flexure of the colon where there is also mild wall thickening and inflammatory change. Mild distal colonic diverticulosis. The inflammatory changes may represent primary pancreatitis with secondary inflammation of the hepatic flexure of the colon, or primary proximal sigmoid colon diverticulitis with secondary extension to the  pancreatic tail. 3. No abscess or drainable fluid collection. 4. Subacute or older inferior left pubic ramus nondisplaced fracture. Electronically Signed   By: Yvonne Kendall M.D.   On: 12/30/2021 14:30   DG CHEST PORT 1 VIEW  Result Date: 01/03/2022 CLINICAL DATA:  Shortness of breath EXAM: PORTABLE CHEST 1 VIEW COMPARISON:  December 30, 2021 FINDINGS: The cardiomediastinal silhouette is unchanged in contour.LEFT chest cardiac pacing device. Atherosclerotic calcifications of the aorta. Low lung volume film. No pleural effusion. No pneumothorax. Mildly increased perihilar vascular fullness and interstitial prominence. LEFT basilar platelike opacity. Visualized abdomen is unremarkable. IMPRESSION: Constellation of findings are favored to reflect mild pulmonary edema with LEFT basilar atelectasis. Electronically Signed   By: Valentino Saxon M.D.   On: 01/03/2022 13:09   DG Chest Port 1 View  Result Date: 12/30/2021 CLINICAL DATA:  Cough.  Generalized abdominal pain. EXAM: PORTABLE CHEST 1 VIEW COMPARISON:  12/18/2021 FINDINGS: A dual lead pacemaker remains in place. The cardiac silhouette remains enlarged. Aortic atherosclerosis is noted. Lung volumes remain low with mild left basilar opacity which is stable  to slightly increased. No edema, sizable pleural effusion, or pneumothorax is identified. No acute osseous abnormality is seen. IMPRESSION: Low lung volumes with left basilar atelectasis. Electronically Signed   By: Logan Bores M.D.   On: 12/30/2021 11:46   DG Chest Port 1 View  Result Date: 12/18/2021 CLINICAL DATA:  Shortness of breath. EXAM: PORTABLE CHEST 1 VIEW COMPARISON:  06/21/2020. FINDINGS: Trachea is midline. Heart is enlarged. Thoracic aorta is calcified. Pacemaker lead tips are in the right atrium and right ventricle. Lungs are somewhat low in volume with minimal left basilar subsegmental volume loss. No pleural fluid. IMPRESSION: No acute findings. Electronically Signed   By: Lorin Picket  M.D.   On: 12/18/2021 14:34   DG Swallowing Func-Speech Pathology  Result Date: 01/04/2022 Table formatting from the original result was not included. Objective Swallowing Evaluation: Type of Study: MBS-Modified Barium Swallow Study  Patient Details Name: Jordan [name] MRN: [id number] Date of Birth: 01-Jun-1941 Today's Date: 01/04/2022 Time: SLP Start Time (ACUTE ONLY): 1205 -SLP Stop Time (ACUTE ONLY): 1225 SLP Time Calculation (min) (ACUTE ONLY): 20 min Past Medical History: Past Medical History: Diagnosis Date  Arthritis   R shoulder, bone spur  Arthritis   "hands" (03/25/2016)  Chronic diastolic heart failure (HCC)   Chronic lower back pain   Coronary heart disease   Dr Pernell Dupre  H/O cardiovascular stress test   perhaps last one was 2009  Heart murmur   12/29/11 echo: mild MR, no AS, trivial TR  History of gout   Hyperlipidemia   Hypertension   saw Dr. Linard Millers last earl;y- 2014, cardiac cath. last done ?2009, blocks seen didn't require any intervention at that point.  Narcolepsy   [name] 04-11-97; Mean Latency 1.77mn, SOREM 2  OSA on CPAP   NPSG 12-13-98 AHI 22.7  PONV (postoperative nausea and vomiting)   Presence of permanent cardiac pacemaker   Shortness of breath   with exertion   Stroke ([place]   Type II diabetes mellitus ([place]  Past Surgical History: Past Surgical History: Procedure Laterality Date  CARDIAC CATHETERIZATION  2009?  EP IMPLANTABLE DEVICE N/A 03/25/2016  Procedure: Pacemaker Implant - Dual Chamber;  Surgeon: GEvans Lance MD;  [facility];  Service: Cardiovascular;  Laterality: N/A;  FRACTURE SURGERY    INSERT / REPLACE / REMOVE PACEMAKER  03/24/2016  IR KYPHO LUMBAR INC FX REDUCE BONE BX UNI/BIL CANNULATION INC/IMAGING  12/29/2021  IR RADIOLOGIST EVAL & MGMT  12/15/2021  LEFT AND RIGHT HEART CATHETERIZATION WITH CORONARY ANGIOGRAM N/A 06/05/2014  Procedure: LEFT AND RIGHT HEART CATHETERIZATION WITH CORONARY ANGIOGRAM;  Surgeon: HSinclair Grooms MD;  [facility];  Service:  Cardiovascular;  Laterality: N/A;  NASAL FRACTURE SURGERY    SHOULDER ARTHROSCOPY WITH ROTATOR CUFF REPAIR AND SUBACROMIAL DECOMPRESSION Right 08/16/2013  Procedure: SHOULDER ARTHROSCOPY WITH ROTATOR CUFF REPAIR AND SUBACROMIAL DECOMPRESSION;  Surgeon: JNita Sells MD;  [place] [facility]  Service: Orthopedics;  Laterality: Right;  Right shoulder arthroscopy rotator cuff repair, subacromial decompression.  TONSILLECTOMY   HPI: Per MD note, Jordan [name] [facility] 81[facility] suspected due to medications - Seroquel was started.  Jordan [name] underwent kyphoplasty on 2/28 went home started have stomach pain and dry heave.  PMH of hypertension ,noninsulin-dependent type 2 diabetes, CVA, Parkinson's, bradycardia-s/p PPM placement, OSA, narcolepsy.  He was brought to the ED by EMS due to abdominal pain - put on oxygen by EMS due  to o2 sats at 90's.  Wife reported to Md that patient has sleep apnea.  Wife reported to MD that patient has intermittent chronic cough.  CT abdomen Lower chest: Small right and trace left pleural effusions new from 10/29/2021.  Per MD note, Sepsis present on admission with tachypnea, leukocytosis, lactic acidosis, fever, source of infection likely intra-abdominal  - initial CT concerning for Pancreatitis versus proximal sigmoid colon diverticulitis, with elevated lipase, normal LFT.  Swallow evaluation ordered per RN discussion with wife and concern for Jordan [name] having dysphagia. RN and NT report Jordan [name] has not consumed po intake today due to his mentation.  Per prior brain imaging studies -Chronic infarct left frontal lobe anteriorly. Small  chronic infarcts in the cerebellum bilaterally. Chronic infarcts right and left thalamic.  Subjective: cooperative, fatigued  Recommendations for follow up therapy are one component of a multi-disciplinary discharge planning process, led by the attending physician.  Recommendations may be updated based on patient status, additional functional criteria  and insurance authorization. Assessment / Plan / Recommendation Clinical Impressions 01/04/2022 Clinical Impression Patient presents with a mild-moderate oral and a mild pharyngeal phase dysphagia. During oral phase, patient with decreased bolus cohesion with barium tablet in puree solids, requiring thin liquid barium sip to transit. He exhibited a munch chewing pattern with graham cracker. He did exhibit decreased anterior to posterior movement of liquids as well but with good oral containment of bolus and no premature spillage in vallecular sinus observed. During pharyngeal phase of swallow, patient exhibited a couple instances of mildly delayed swallow initiation at level of vallecular sinus and trace amount of vallecular residuals observed post initial swallows with thin liquids. No penetration or aspiration observed and cricopharyngeal phase of swallow appeared [facility] with no backflow of barium. Esophageal sweep did not reveal any significant s/s of dysmotility or stasis of barium. Pharyngeal phase of swallow was [facility] for puree solids, barium tablet in puree and regular texture solids. SLP is recommending to upgrade liquids to thin and continue with regular solids. SLP Visit Diagnosis Dysphagia, oropharyngeal phase (R13.12) Attention and concentration deficit following -- Frontal lobe and executive function deficit following -- Impact on safety and function Mild aspiration risk   Treatment Recommendations 01/04/2022 Treatment Recommendations Therapy as outlined in treatment plan below   Prognosis 01/04/2022 Prognosis for Safe Diet Advancement Good Barriers to Reach Goals -- Barriers/Prognosis Comment -- Diet Recommendations 01/04/2022 SLP Diet Recommendations Regular solids;Thin liquid Liquid Administration via Cup;Straw Medication Administration Whole meds with liquid Compensations Slow rate;Small sips/bites Postural Changes Seated upright at 90 degrees   Other Recommendations 01/04/2022 Recommended Consults -- Oral Care  Recommendations Oral care BID;Staff/trained caregiver to provide oral care Other Recommendations -- Follow Up Recommendations Skilled nursing-short term rehab (<3 hours/day) Assistance recommended at discharge Frequent or constant Supervision/Assistance Functional Status Assessment -- Frequency and Duration  01/04/2022 Speech Therapy Frequency (ACUTE ONLY) min 1 x/week Treatment Duration 1 week   Oral Phase 01/04/2022 Oral Phase Impaired Oral - Pudding Teaspoon -- Oral - Pudding Cup -- Oral - Honey Teaspoon -- Oral - Honey Cup -- Oral - Nectar Teaspoon -- Oral - Nectar Cup Delayed oral transit;Reduced posterior propulsion;Weak lingual manipulation Oral - Nectar Straw -- Oral - Thin Teaspoon -- Oral - Thin Cup Reduced posterior propulsion;Delayed oral transit;Weak lingual manipulation Oral - Thin Straw Delayed oral transit;Weak lingual manipulation;Reduced posterior propulsion Oral - Puree Delayed oral transit;Reduced posterior propulsion;Weak lingual manipulation Oral - Mech Soft -- Oral - Regular Reduced posterior propulsion;Delayed oral transit;Impaired mastication Oral -  Multi-Consistency -- Oral - Pill Decreased bolus cohesion;Delayed oral transit;Weak lingual manipulation;Reduced posterior propulsion Oral Phase - Comment --  Pharyngeal Phase 01/04/2022 Pharyngeal Phase Impaired Pharyngeal- Pudding Teaspoon -- Pharyngeal -- Pharyngeal- Pudding Cup -- Pharyngeal -- Pharyngeal- Honey Teaspoon -- Pharyngeal -- Pharyngeal- Honey Cup -- Pharyngeal -- Pharyngeal- Nectar Teaspoon -- Pharyngeal -- Pharyngeal- Nectar Cup WFL Pharyngeal -- Pharyngeal- Nectar Straw -- Pharyngeal -- Pharyngeal- Thin Teaspoon -- Pharyngeal -- Pharyngeal- Thin Cup Delayed swallow initiation-vallecula;Pharyngeal residue - valleculae Pharyngeal -- Pharyngeal- Thin Straw Pharyngeal residue - valleculae Pharyngeal -- Pharyngeal- Puree -- Pharyngeal -- Pharyngeal- Mechanical Soft -- Pharyngeal -- Pharyngeal- Regular WFL Pharyngeal -- Pharyngeal-  Multi-consistency -- Pharyngeal -- Pharyngeal- Pill WFL Pharyngeal -- Pharyngeal Comment --  Cervical Esophageal Phase  01/04/2022 Cervical Esophageal Phase WFL Pudding Teaspoon -- Pudding Cup -- Honey Teaspoon -- Honey Cup -- Nectar Teaspoon -- Nectar Cup -- Nectar Straw -- Thin Teaspoon -- Thin Cup -- Thin Straw -- Puree -- Mechanical Soft -- Regular -- Multi-consistency -- Pill -- Cervical Esophageal Comment -- Sonia Baller, MA, CCC-SLP Speech Therapy                     IR KYPHO LUMBAR INC FX REDUCE BONE BX UNI/BIL CANNULATION INC/IMAGING  Result Date: 12/31/2021 INDICATION: Low back pain secondary to compression fracture at L2. EXAM: BALLOON KYPHOPLASTY AT L2 COMPARISON:  MRI of the lumbosacral spine of November 19, 2021. MEDICATIONS: As antibiotic prophylaxis, Ancef 2 g IV was ordered pre-procedure and administered intravenously within 1 hour of incision. ANESTHESIA/SEDATION: Moderate (conscious) sedation was employed during this procedure. A total of Versed 2 mg and Fentanyl 50 mcg was administered intravenously by the radiology nurse. Total intra-service moderate Sedation Time: 35 minutes. The patient's level of consciousness and vital signs were monitored continuously by radiology nursing throughout the procedure under my direct supervision. FLUOROSCOPY: Radiation Exposure Index (as provided by the fluoroscopic device): 128 mGy Kerma COMPLICATIONS: None immediate. PROCEDURE: Following a full explanation of the procedure along with the potential associated complications, an informed witnessed consent was obtained. The patient was placed prone on the fluoroscopic table. The skin overlying the lumbar region was then prepped and draped in the usual sterile fashion. The right pedicle at L2 was then infiltrated with 0.25% bupivacaine followed by the advancement of an 11-gauge Jamshidi needle through the right pedicle into the posterior one-third at L2. This was then exchanged for a Kyphon advanced osteo  introducer system comprised of a working cannula and a Kyphon osteo drill. This combination was then advanced over a Kyphon osteo bone pin until the tip of the Kyphon osteo drill was in the posterior third at L2. At this time, the bone pin was removed. In a medial trajectory, the combination was advanced until the tip of the working cannula was inside the posterior one-third at L2. The osteo drill was removed. Through the working cannula, a Kyphon inflatable bone tamp 20 x 3 was advanced and positioned with the distal marker 5 mm from the anterior aspect of L2. Crossing of the midline was seen on the AP projection. At this time, the balloon was expanded using contrast via a Kyphon inflation syringe device via microtubing. Inflations were continued until there was apposition with the superior endplate. At this time, methylmethacrylate mixture was reconstituted with Tobramycin in the Kyphon bone mixing device system. This was then loaded onto the Kyphon bone fillers. The balloon was deflated and removed followed by the instillation of 7 bone filler equivalents  of methylmethacrylate mixture at L2 with excellent filling in the AP and lateral projections. No extravasation was noted in the disk spaces or posteriorly into the spinal canal. No epidural venous contamination was seen. The working cannula and the bone filler were then retrieved and removed. Hemostasis was achieved at the skin entry site. Patient tolerated the procedure well. IMPRESSION: 1. Status post vertebral body augmentation using balloon kyphoplasty at L2 as described without event. The patient has suffered a fracture of the vertebral body. It is recommended that patients aged 80 years or older be evaluated for possible testing or treatment of osteoporosis. A copy of this procedure report is sent to the patient's referring physician. Electronically Signed   By: Luanne Bras M.D.   On: 12/31/2021 08:43   ECHOCARDIOGRAM COMPLETE  Result Date:  01/06/2022    ECHOCARDIOGRAM REPORT   Patient Name:   Jordan [name] Date of Exam: 01/06/2022 Medical Rec #:  [id number]       Height:       68.0 in Accession #:    [id number]      Weight:       208.3 lb Date of Birth:  August 09, 1941       BSA:          2.080 m Patient Age:    89 years        BP:           171/60 mmHg Patient Gender: M               HR:           80 bpm. Exam Location:  Inpatient Procedure: 2D Echo Indications:    Dyspnea  History:        Patient has prior history of Echocardiogram examinations, most                 recent 04/15/2020. Stroke, Signs/Symptoms:Murmur and Shortness of                 Breath; Risk Factors:Dyslipidemia, Hypertension and Diabetes.  Sonographer:    Arlyss Gandy Referring Phys: [id number] [place]  1. Left ventricular ejection fraction, by estimation, is 60 to 65%. The left ventricle has normal function. The left ventricle has no regional wall motion abnormalities. There is mild concentric left ventricular hypertrophy. Left ventricular diastolic parameters are indeterminate.  2. Right ventricular systolic function is low normal. The right ventricular size is normal. There is normal pulmonary artery systolic pressure.  3. Left atrial size was mildly dilated.  4. Right atrial size was mildly dilated.  5. The mitral valve is grossly normal. Trivial mitral valve regurgitation. No evidence of mitral stenosis. Moderate mitral annular calcification.  6. The aortic valve was not well visualized. Aortic valve regurgitation is trivial. Mild aortic valve stenosis.  7. The inferior vena cava is normal in size with <50% respiratory variability, suggesting right atrial pressure of 8 mmHg. Comparison(s): No significant change from prior study. Conclusion(s)/Recommendation(s): Otherwise normal echocardiogram, with minor abnormalities described in the report. FINDINGS  Left Ventricle: Left ventricular ejection fraction, by estimation, is 60 to 65%. The left ventricle has  normal function. The left ventricle has no regional wall motion abnormalities. The left ventricular internal cavity size was normal in size. There is  mild concentric left ventricular hypertrophy. Abnormal (paradoxical) septal motion, consistent with left bundle branch block. Left ventricular diastolic parameters are indeterminate. Right Ventricle: The right ventricular size is normal. Right vetricular wall thickness was not  well visualized. Right ventricular systolic function is low normal. There is normal pulmonary artery systolic pressure. The tricuspid regurgitant velocity is 2.01 m/s, and with an assumed right atrial pressure of 8 mmHg, the estimated right ventricular systolic pressure is 93.8 mmHg. Left Atrium: Left atrial size was mildly dilated. Right Atrium: Right atrial size was mildly dilated. Pericardium: There is no evidence of pericardial effusion. Mitral Valve: The mitral valve is grossly normal. There is mild thickening of the mitral valve leaflet(s). There is mild calcification of the mitral valve leaflet(s). Moderate mitral annular calcification. Trivial mitral valve regurgitation. No evidence of mitral valve stenosis. Tricuspid Valve: The tricuspid valve is not well visualized. Tricuspid valve regurgitation is trivial. No evidence of tricuspid stenosis. Aortic Valve: The aortic valve was not well visualized. Aortic valve regurgitation is trivial. Mild aortic stenosis is present. Aortic valve mean gradient measures 7.0 mmHg. Aortic valve peak gradient measures 13.3 mmHg. Aortic valve area, by VTI measures 2.59 cm. Pulmonic Valve: The pulmonic valve was not well visualized. Pulmonic valve regurgitation is not visualized. Aorta: The aortic root and ascending aorta are structurally normal, with no evidence of dilitation and the aortic arch was not well visualized. Venous: The inferior vena cava is normal in size with less than 50% respiratory variability, suggesting right atrial pressure of 8 mmHg.  IAS/Shunts: The atrial septum is grossly normal. Additional Comments: A device lead is visualized in the right ventricle and right atrium.  LEFT VENTRICLE PLAX 2D LVIDd:         3.70 cm   Diastology LVIDs:         2.60 cm   LV e' medial:    7.29 cm/s LV PW:         1.30 cm   LV E/e' medial:  9.5 LV IVS:        1.30 cm   LV e' lateral:   8.27 cm/s LVOT diam:     2.20 cm   LV E/e' lateral: 8.4 LV SV:         81 LV SV Index:   39 LVOT Area:     3.80 cm  RIGHT VENTRICLE             IVC RV Basal diam:  3.70 cm     IVC diam: 2.00 cm RV Mid diam:    3.00 cm RV S prime:     11.30 cm/s TAPSE (M-mode): 1.6 cm LEFT ATRIUM             Index        RIGHT ATRIUM           Index LA Vol (A2C):   47.4 ml 22.79 ml/m  RA Area:     16.60 cm LA Vol (A4C):   44.7 ml 21.50 ml/m  RA Volume:   42.80 ml  20.58 ml/m LA Biplane Vol: 46.4 ml 22.31 ml/m  AORTIC VALVE AV Area (Vmax):    2.56 cm AV Area (Vmean):   2.42 cm AV Area (VTI):     2.59 cm AV Vmax:           182.50 cm/s AV Vmean:          125.500 cm/s AV VTI:            0.313 m AV Peak Grad:      13.3 mmHg AV Mean Grad:      7.0 mmHg LVOT Vmax:         123.00 cm/s LVOT Vmean:  80.000 cm/s LVOT VTI:          0.213 m LVOT/AV VTI ratio: 0.68  AORTA Ao Asc diam: 3.30 cm MITRAL VALVE                TRICUSPID VALVE MV Area (PHT): 2.01 cm     TR Peak grad:   16.2 mmHg MV Decel Time: 377 msec     TR Vmax:        201.00 cm/s MV E velocity: 69.50 cm/s MV A velocity: 125.00 cm/s  SHUNTS MV E/A ratio:  0.56         Systemic VTI:  0.21 m                             Systemic Diam: 2.20 cm Buford Dresser MD Electronically signed by Buford Dresser MD Signature Date/Time: 01/06/2022/4:33:05 PM    Final    IR Radiologist Eval & Mgmt  Result Date: 12/15/2021 EXAM: NEW PATIENT OFFICE VISIT CHIEF COMPLAINT: Low back pain. Current Pain Level: 1-10 HISTORY OF PRESENT ILLNESS: History obtained from the patient, his spouse, and also the electronic medical records. 81 year old right  handed gentleman referred for evaluation and management of worsening low back pain, seen on the MRI of the lumbosacral spine of November 19, 2021. According to the patient and the spouse the patient's low intensive intermittent chronic low back pain worsened some time and subsequent to ischemic stroke which left him weak in the right arm and leg and also associated with difficulty with balance. Since that time the patient has been having multiple of falls, soft and hard. He reports around July 31, 2021, the patient apparently fell on his buttocks following which he started experiencing severe low back pain. An MRI done on January 19th did show an inferior endplate compression fracture at L2. 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 Patient has been undergoing intensive rehab, but continues to have significant debilitating pain in the low back region especially with prolonged standing and walking. The patient reports this as an 8/10 when severe. He has to sit down for pain relief. Pain relieved some with over the counter Tylenol. He has also been prescribed a lidocaine patch in the lumbar region with modest relief. At the present, the patient reports no acute radiculopathy associated with the pain. Patient ambulates with a walker due to pain and also due to sequela of his stroke. Pain does not interfere with the patient's sleeping pattern. His appetite is normal. He eats regular meals. He denies any symptoms of dysuria, hematuria, or of polyuria. Denies any autonomic dysfunction of bowel or bladder. Denies recent chest pain, shortness of breath, palpitation, coughing, wheezing or hemoptysis. He denies any abdominal pain, difficulty swallowing, constipation, diarrhea or melena. Denies recent chills, fever or rigors. Patient lives with his spouse. He and she feel significant debilitation related to the pain especially  on standing and walking. Diagnosis * : Date . * : Arthritis * : * : R shoulder, bone spur . * : Arthritis * : * : "hands" (03/25/2016) . * : Chronic diastolic heart failure (HCC) * : . * : Chronic lower back pain * : . * : Coronary heart disease * : * : Dr Pernell Dupre . * : H/O cardiovascular stress test * : * : perhaps last one was 2009 . * : Heart murmur * : * :  12/29/11 echo: mild MR, no AS, trivial TR . * : History of gout * : . * : Hyperlipidemia * : . * : Hypertension * : * : saw Dr. Linard Millers last earl;y- 2014, cardiac cath. last done ?2009, blocks seen didn't require any intervention at that point. . * : Narcolepsy * : * : [name] 04-11-97; Mean Latency 1.55mn, SOREM 2 . * : OSA on CPAP * : * : NPSG 12-13-98 AHI 22.7 . * : PONV (postoperative nausea and vomiting) * : . * : Presence of permanent cardiac pacemaker * : . * : Shortness of breath * : * : with exertion . * : Stroke ([place] * : . * : Type II diabetes mellitus ([place] * : PSH: Past Surgical History: Procedure * : Laterality * : Date . * : CARDIAC CATHETERIZATION * : * : 2009? .[name]* : EP IMPLANTABLE DEVICE * : N/A * : 03/25/2016 * : Procedure: Pacemaker Implant - Dual Chamber; Surgeon: GEvans Lance MD; [facility]; Service: Cardiovascular; Laterality: N/A; . * : FRACTURE SURGERY * : * : . * : INSERT / REPLACE / REMOVE PACEMAKER * : * : 03/24/2016 . * : LEFT AND RIGHT HEART CATHETERIZATION WITH CORONARY ANGIOGRAM * : N/A * : 06/05/2014 * : Procedure: LEFT AND RIGHT HEART CATHETERIZATION WITH CORONARY ANGIOGRAM; Surgeon: HSinclair Grooms MD; [facility]; Service: Cardiovascular; Laterality: N/A; . * : NASAL FRACTURE SURGERY * : * : . * : SHOULDER ARTHROSCOPY WITH ROTATOR CUFF REPAIR AND SUBACROMIAL DECOMPRESSION * : Right * : 08/16/2013 * : Procedure: SHOULDER ARTHROSCOPY WITH ROTATOR CUFF REPAIR AND SUBACROMIAL DECOMPRESSION; Surgeon: JNita Sells MD; [place] Service: Orthopedics; Laterality: Right; Right shoulder  arthroscopy rotator cuff repair, subacromial decompression. . * : TONSILLECTOMY * : * : Socioeconomic History . * : Marital status: * : Married * : * : Spouse name: * : Kelly . * : Number of children: * : Not on file . * : Years of education: * : Not on file . * : Highest education level: * : Not on file Occupational History . 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 * : Occupation: * : [employer] Tobacco Use . * : Smoking status: * : Former * : * : Packs/day: * : 2.50 * : * : Years: * : 6.00 * : * : Pack years: * : 15.00 * : * : Types: * : Cigarettes * : * : Quit date: * : 06/05/1967 * : * : Years since quitting: * : 54.5 . * : Smokeless tobacco: * : Never Vaping Use . * : Vaping Use: * : Never used Substance and Sexual Activity . * : Alcohol use: * : Yes * : * : Comment: rare . * : Drug use: * : No . * : Sexual activity: * : Not Currently Other Topics * : Concern . * : Not on file Social History Narrative * : Lives with wife and son * : Right handed * : Drinks 1 cups caffeine daily Food Insecurity: Not on file Transportation Needs: Not on file Physical Activity: Not on file Stress: Not on file Social Connections: Not on file Intimate Partner Violence: Not on file Family History: Family History Problem * : Relation * : Age of Onset . * : Sleep apnea * : Sister * : . * : Colon cancer * : Sister * : Medications:  Current Outpatient Medications on File Prior to Visit Medication * : Sig * : Dispense * : Refill . * : acetaminophen (TYLENOL) 500 MG tablet * : Take 1,000 mg by mouth 2 (two) times daily. * : * : . * : atorvastatin (LIPITOR) 40 MG tablet * : Take 0.5 tablets (20 mg total) by mouth daily. * : 30 tablet * : 0 . * : carbidopa-levodopa (SINEMET) 25-100 MG tablet * : Take 1 tablet by mouth 3 (three) times daily. * : 90 tablet * : 5 . * : Carbidopa-Levodopa ER (SINEMET CR) 25-100 MG tablet controlled release * : Take 1 tablet by mouth at bedtime. * : 30 tablet * : 5 . * : cholecalciferol (VITAMIN D3) 25 MCG  (1000 UNIT) tablet * : Take 1,000 Units by mouth daily. * : * : . * : clonazePAM (KLONOPIN) 0.5 MG tablet * : TAKE 1 TABLET BY MOUTH EVERY DAY AT BEDTIME AS NEEDED FOR SLEEP * : 30 tablet * : 5 . * : clopidogrel (PLAVIX) 75 MG tablet * : Take 1 tablet (75 mg total) by mouth daily. * : 30 tablet * : 0 . * : Coenzyme Q10 (CO Q-10 PO) * : Take 1 capsule by mouth daily. * : * : . * : lidocaine (LIDODERM) 5 % * : Lidoderm 5 % topical patch APPLY 1 PATCH BY TOPICAL ROUTE ONCE DAILY (MAY WEAR UP TO 12 HOURS.) * : * : . * : lisinopril (ZESTRIL) 10 MG tablet * : Take 1 tablet (10 mg total) by mouth daily. (Patient taking differently: Take 20 mg by mouth daily.) * : 30 tablet * : 0 . * : Melatonin 10 MG TABS * : Take 10 mg by mouth at bedtime. * : * : . * : metFORMIN (GLUCOPHAGE) 500 MG tablet * : Take 500 mg by mouth. 4 tables once a day * : * : . * : metoprolol tartrate (LOPRESSOR) 25 MG tablet * : Take 1 tablet (25 mg total) by mouth 2 (two) times daily. * : 60 tablet * : 0 . * : Multiple Vitamin (MULTIVITAMIN WITH MINERALS) TABS tablet * : Take 1 tablet by mouth daily. * : * : . * : naproxen sodium (ALEVE) 220 MG tablet * : Take 220 mg by mouth daily as needed. * : * : . * : NITROSTAT 0.4 MG SL tablet * : PLACE 1 TABLET (0.4 MG TOTAL) UNDER THE TONGUE EVERY 5 (FIVE) MINUTES AS NEEDED FOR CHEST PAIN. * : 25 tablet * : 5 . * : PRESCRIPTION MEDICATION * : See admin instructions. CPAP- At bedtime * : * : . * : sertraline (ZOLOFT) 25 MG tablet * : Take 1 tablet (25 mg total) by mouth daily. * : 30 tablet * : 0 Allergies:  No Known Allergies. REVIEW OF SYSTEMS: Negative unless as mentioned above. PHYSICAL EXAMINATION: Patient sitting comfortably in a wheelchair with prompt responses appropriately. Moderate eye contact. Neurologically speech and comprehension normal without any gross lateralizing abnormalities. Station and gait not tested. Moderate tenderness elicited with palpation in the L2-L3 region. ASSESSMENT AND PLAN:  Findings of the MRI scan were reviewed with the patient, and also with the spouse. 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 Pain has been compounded by the onset of new L2 inferior endplate fracture. Patient does report significant debilitation and interference with lifestyle especially  when walking and with standing. Option of vertebral body augmentation with inter vertebroplasty or kyphoplasty reviewed with both him and the spouse. The procedure would allow alleviation of pain emanating from the L2 fracture. However, fracture related to the degenerative changes in the lumbosacral spine described above would probably not be impacted. The procedure, the risks and alternatives were reviewed. Procedure would be an outpatient procedure with the patient having to be monitored in short-stay for 3 hours post procedure. The patient and spouse would like to discuss further prior to finalizing their decision. In the meantime, they were asked to call should they have any concerns or questions. Electronically Signed   By: Luanne Bras M.D.   On: 12/15/2021 08:35    Labs: BNP (last 3 results) No results for input(s): BNP in the last 8760 hours. Basic Metabolic Panel: Recent Labs  Lab 01/02/22 0511 01/03/22 0555 01/04/22 0455 01/05/22 0517 01/06/22 0527 01/07/22 0502  NA 133* 134* 137 134* 137 137  K 3.2* 4.1 3.5 3.8 3.7 3.6  CL 103 103 105 103 102 104  CO2 '23 23 23 23 25 25  '$ GLUCOSE 113* 141* 152* 146* 147* 162*  BUN '19 17 15 17 18 21  '$ CREATININE 0.76 0.81 0.76 0.68 0.76 0.79  CALCIUM 8.0* 8.4* 8.6* 8.5* 9.1 9.0  MG 1.8 1.9 1.6*  --  1.7  --    Liver Function Tests: Recent Labs  Lab 01/07/22 0502  AST 21  ALT 9  ALKPHOS 77  BILITOT 0.3  PROT 6.5  ALBUMIN 2.9*   No results for input(s): LIPASE, AMYLASE in the last 168 hours. No results for input(s): AMMONIA in the last 168 hours. CBC: Recent Labs   Lab 01/02/22 0511 01/04/22 0455 01/06/22 0527 01/07/22 0502  WBC 14.3* 13.9* 14.1* 13.8*  NEUTROABS 11.2*  --   --   --   HGB 11.7* 11.9* 13.5 13.0  HCT 35.6* 36.0* 41.0 38.4*  MCV 91.5 89.8 89.5 89.1  PLT 192 247 306 330   Cardiac Enzymes: No results for input(s): CKTOTAL, CKMB, CKMBINDEX, TROPONINI in the last 168 hours. BNP: Invalid input(s): POCBNP CBG: Recent Labs  Lab 01/07/22 1216 01/07/22 1732 01/07/22 2143 01/08/22 0735 01/08/22 1150  GLUCAP 238* 167* 255* 166* 184*   D-Dimer No results for input(s): DDIMER in the last 72 hours. Hgb A1c No results for input(s): HGBA1C in the last 72 hours. Lipid Profile No results for input(s): CHOL, HDL, LDLCALC, TRIG, CHOLHDL, LDLDIRECT in the last 72 hours. Thyroid function studies No results for input(s): TSH, T4TOTAL, T3FREE, THYROIDAB in the last 72 hours.  Invalid input(s): FREET3 Anemia work up No results for input(s): VITAMINB12, FOLATE, FERRITIN, TIBC, IRON, RETICCTPCT in the last 72 hours. Urinalysis    Component Value Date/Time   COLORURINE YELLOW 12/30/2021 1053   APPEARANCEUR CLEAR 12/30/2021 1053   LABSPEC 1.026 12/30/2021 1053   PHURINE 5.0 12/30/2021 1053   GLUCOSEU NEGATIVE 12/30/2021 1053   HGBUR LARGE (A) 12/30/2021 1053   BILIRUBINUR NEGATIVE 12/30/2021 1053   KETONESUR 5 (A) 12/30/2021 1053   PROTEINUR 30 (A) 12/30/2021 1053   NITRITE NEGATIVE 12/30/2021 1053   LEUKOCYTESUR NEGATIVE 12/30/2021 1053   Sepsis Labs Invalid input(s): PROCALCITONIN,  WBC,  LACTICIDVEN Microbiology Recent Results (from the past 240 hour(s))  Culture, blood (Routine X 2) w Reflex to ID Panel     Status: None   Collection Time: 12/30/21 11:18 AM   Specimen: BLOOD  Result Value Ref Range Status   Specimen Description  Final    BLOOD RIGHT ANTECUBITAL Performed at [facility] [address], [place], [place] [address]    Special Requests   Final    BOTTLES DRAWN AEROBIC AND ANAEROBIC Blood  Culture results may not be optimal due to an excessive volume of blood received in culture bottles Performed at [facility] [address] [place], [place] [address]    Culture   Final    NO GROWTH 5 DAYS Performed at [facility], [place] [address], [place], [place] [address]    Report Status 01/04/2022 FINAL  Final  Culture, blood (Routine X 2) w Reflex to ID Panel     Status: None   Collection Time: 12/30/21 11:23 AM   Specimen: BLOOD  Result Value Ref Range Status   Specimen Description   Final    BLOOD LEFT ANTECUBITAL Performed at [facility] [address], [place], [place] [address]    Special Requests   Final    BOTTLES DRAWN AEROBIC AND ANAEROBIC Blood Culture results may not be optimal due to an excessive volume of blood received in culture bottles Performed at [facility] [address] [place], [place] [address]    Culture   Final    NO GROWTH 5 DAYS Performed at [facility], [place] [address], [place], [place] [address]    Report Status 01/04/2022 FINAL  Final  Resp Panel by RT-PCR (Flu A&B, Covid) Nasopharyngeal Swab     Status: None   Collection Time: 12/30/21 11:24 AM   Specimen: Nasopharyngeal Swab; Nasopharyngeal(NP) swabs in vial transport medium  Result Value Ref Range Status   SARS Coronavirus 2 by RT PCR NEGATIVE NEGATIVE Final    Comment: (NOTE) SARS-CoV-2 target nucleic acids are NOT DETECTED.  The SARS-CoV-2 RNA is generally detectable in upper respiratory specimens during the acute phase of infection. The lowest concentration of SARS-CoV-2 viral copies this assay can detect is 138 copies/mL. A negative result does not preclude SARS-Cov-2 infection and should not be used as the sole basis for treatment or other patient management decisions. A negative result may occur with  improper specimen collection/handling, submission of specimen other than nasopharyngeal swab, presence of  viral mutation(s) within the areas targeted by this assay, and inadequate number of viral copies(<138 copies/mL). A negative result must be combined with clinical observations, patient history, and epidemiological information. The expected result is Negative.  Fact Sheet for Patients:  EntrepreneurPulse.com.au  Fact Sheet for Healthcare Providers:  IncredibleEmployment.be  This test is no t yet approved or cleared by the [place] FDA and  has been authorized for detection and/or diagnosis of SARS-CoV-2 by FDA under an Emergency Use Authorization (EUA). This EUA will remain  in effect (meaning this test can be used) for the duration of the COVID-19 declaration under Section 564(b)(1) of the Act, 21 U.S.C.section 360bbb-3(b)(1), unless the authorization is terminated  or revoked sooner.       Influenza A by PCR NEGATIVE NEGATIVE Final   Influenza B by PCR NEGATIVE NEGATIVE Final    Comment: (NOTE) The Xpert Xpress SARS-CoV-2/FLU/RSV plus assay is intended as an aid in the diagnosis of influenza from Nasopharyngeal swab specimens and should not be used as a sole basis for treatment. Nasal washings and aspirates are unacceptable for Xpert Xpress SARS-CoV-2/FLU/RSV testing.  Fact Sheet for Patients: EntrepreneurPulse.com.au  Fact Sheet for Healthcare Providers: IncredibleEmployment.be  This test is not yet approved or cleared by the  [place] Architectural technologist and has been authorized for detection and/or diagnosis of SARS-CoV-2 by FDA under an Print production planner (EUA). This EUA will remain in effect (meaning this test can be used) for the duration of the COVID-19 declaration under Section 564(b)(1) of the Act, 21 U.S.C. section 360bbb-3(b)(1), unless the authorization is terminated or revoked.  Performed at [facility], [place] [address] [place], [place] [address]   MRSA Next Gen by  PCR, Nasal     Status: None   Collection Time: 12/30/21  3:30 PM   Specimen: Nasal Mucosa; Nasal Swab  Result Value Ref Range Status   MRSA by PCR Next Gen NOT DETECTED NOT DETECTED Final    Comment: (NOTE) The GeneXpert MRSA Assay (FDA approved for NASAL specimens only), is one component of a comprehensive MRSA colonization surveillance program. It is not intended to diagnose MRSA infection nor to guide or monitor treatment for MRSA infections. Test performance is not FDA approved in patients less than 79 years old. Performed at [facility], [place] [address] [place], [place] [address]   Urine Culture     Status: None   Collection Time: 12/31/21  6:45 PM   Specimen: Urine, Clean Catch  Result Value Ref Range Status   Specimen Description   Final    URINE, CLEAN CATCH Performed at [facility], [place] [address] [place], [place] [address]    Special Requests   Final    NONE Performed at [facility], [place] [address], [place], [place] [address]    Culture   Final    NO GROWTH Performed at [facility], [place] [address], [place], [place] [address]    Report Status 01/02/2022 FINAL  Final     Time coordinating discharge: 35 minutes  SIGNED: Antonieta Pert, MD  [facility] 01/08/2022, 3:23 PM  If 7PM-7AM, please contact night-coverage [email or web address]

## 2022-01-08 NOTE — H&P (Signed)
Physical Medicine and Rehabilitation Admission H&P    Chief Complaint  Patient presents with   Functional deficits    HPI: Jordan [name] is an 81 year old male with history of CAD/chronic diastolic CHF, OSA, M4BR, gout, Parkinson's diease with recent admission 02/17-02-22-23 worsening of gait disorder with falls with hallucinations which was treated with medication adjustment and he was discharged to home. He was readmitted on 12/30/21 with abdominal pain with low grade fever, leucocytosis, lactic acidosis, mild confusion and lethargy secondary to sepsis. He was found to have elevated lipase-165  with inflammatory changes suggestive of primary pancreatitis with secondary inflammation of hepatic flexure of colon or primary sigmoid diverticulitis with secondary extension to pancreatic tail. He was treated with IVF and broad spectrum antibiotics.  He was found to have oropharyngeal dysphagia exacerbated by fluctuating mentation and diet has been adjusted depending on alertness.    Abdominal pain resolved with improvement in agitation and downward trend in leucocytosis therefore antibiotics narrowed to Augmentin. MBS done 03/06 and he was noted to have mild to moderate oral and mild pharyngeal dysphagia-->on Regular, thins with aspiration precautions.  He did develop volume overload with respiratory symptoms on 03/05 requiring IV diuresis. 2D echo done revealing EF 60-65% with mild concentric LVH and trivial MVR. Po intake remains poor requiring multiple supplements. Metformin resumed due to upward trend in BS. Therapy ongoing and patient continues to be limited by disorientation with delay in processing, weakness, poor awareness of deficits and decreased activity tolerance. CIR recommended due to functional decline. Complains of cough.   Review of Systems  Constitutional:  Negative for chills and fever.  HENT:  Negative for hearing loss and tinnitus.   Eyes:  Positive for double vision.   Respiratory:  Positive for cough (chronic intermittent).   Cardiovascular:  Negative for chest pain and palpitations.  Gastrointestinal:  Negative for constipation and heartburn.  Genitourinary:  Negative for frequency and urgency.  Musculoskeletal:  Positive for falls (tends to fall backwards). Negative for back pain.  Neurological:  Positive for dizziness (with positional changes occasionally). Negative for headaches.  Psychiatric/Behavioral:  Positive for memory loss. The patient does not have insomnia.     Past Medical History:  Diagnosis Date   Arthritis    R shoulder, bone spur   Arthritis    "hands" (03/25/2016)   Chronic diastolic heart failure (HCC)    Chronic lower back pain    Coronary heart disease    Dr Pernell Dupre   H/O cardiovascular stress test    perhaps last one was 2009   Heart murmur    12/29/11 echo: mild MR, no AS, trivial TR   History of gout    Hyperlipidemia    Hypertension    saw Dr. Linard Millers last earl;y- 2014, cardiac cath. last done ?2009, blocks seen didn't require any intervention at that point.   Narcolepsy    [name] 04-11-97; Mean Latency 1.87mn, SOREM 2   OSA on CPAP    NPSG 12-13-98 AHI 22.7   PONV (postoperative nausea and vomiting)    Presence of permanent cardiac pacemaker    Shortness of breath    with exertion    [place]    Type II diabetes mellitus ([facility]     Past Surgical History:  Procedure Laterality Date   CARDIAC CATHETERIZATION  2009?   EP IMPLANTABLE DEVICE N/A 03/25/2016   Procedure: Pacemaker Implant - Dual Chamber;  Surgeon: GEvans Lance MD;  [facility];  Service: Cardiovascular;  Laterality: N/A;   FRACTURE SURGERY     INSERT / REPLACE / REMOVE PACEMAKER  03/24/2016   IR KYPHO LUMBAR INC FX REDUCE BONE BX UNI/BIL CANNULATION INC/IMAGING  12/29/2021   IR RADIOLOGIST EVAL & MGMT  12/15/2021   LEFT AND RIGHT HEART CATHETERIZATION WITH CORONARY ANGIOGRAM N/A 06/05/2014   Procedure: LEFT AND RIGHT HEART  CATHETERIZATION WITH CORONARY ANGIOGRAM;  Surgeon: Sinclair Grooms, MD;  Location: [facility] CATH LAB;  Service: Cardiovascular;  Laterality: N/A;   NASAL FRACTURE SURGERY     SHOULDER ARTHROSCOPY WITH ROTATOR CUFF REPAIR AND SUBACROMIAL DECOMPRESSION Right 08/16/2013   Procedure: SHOULDER ARTHROSCOPY WITH ROTATOR CUFF REPAIR AND SUBACROMIAL DECOMPRESSION;  Surgeon: [name], MD;  Location: [facility];  Service: Orthopedics;  Laterality: Right;  Right shoulder arthroscopy rotator cuff repair, subacromial decompression.   TONSILLECTOMY      Family History  Problem Relation Age of Onset   Sleep apnea Sister    Colon cancer Sister     Social History:  reports that he quit smoking about 54 years ago. His smoking use included cigarettes. He has a 15.00 pack-year smoking history. He has never used smokeless tobacco. He reports current alcohol use. He reports that he does not use drugs.   Allergies: No Known Allergies   Medications Prior to Admission  Medication Sig Dispense Refill   acetaminophen (TYLENOL) 500 MG tablet Take 1,000 mg by mouth 2 (two) times daily.     amoxicillin-clavulanate (AUGMENTIN) 875-125 MG tablet Take 1 tablet by mouth every 12 (twelve) hours for 4 days. 8 tablet 0   amphetamine-dextroamphetamine (ADDERALL) 10 MG tablet Take 10 mg by mouth daily as needed (help control symptoms of parkinsons).     atorvastatin (LIPITOR) 40 MG tablet Take 0.5 tablets (20 mg total) by mouth daily. 30 tablet 0   bifidobacterium infantis (ALIGN) capsule Take 1 capsule by mouth daily.     carbidopa-levodopa (SINEMET) 25-100 MG tablet Take 1 tablet by mouth 3 (three) times daily. 90 tablet 5   Carbidopa-Levodopa ER (SINEMET CR) 25-100 MG tablet controlled release Take 1 tablet by mouth at bedtime.     cholecalciferol (VITAMIN D3) 25 MCG (1000 UNIT) tablet Take 1,000 Units by mouth daily.     Coenzyme Q10 (CO Q-10 PO) Take 1 capsule by mouth daily.     docusate sodium (COLACE) 100 MG  capsule Take 1 capsule (100 mg total) by mouth 2 (two) times daily. 10 capsule 0   feeding supplement, GLUCERNA SHAKE, (GLUCERNA SHAKE) LIQD Take 237 mLs by mouth 3 (three) times daily between meals.  0   [START ON 01/09/2022] furosemide (LASIX) 20 MG tablet Take 1 tablet (20 mg total) by mouth daily for 7 days. 7 tablet 0   hydrALAZINE (APRESOLINE) 10 MG tablet Take 1 tablet (10 mg total) by mouth 2 (two) times daily.     insulin aspart (NOVOLOG) 100 UNIT/ML injection Inject 0-15 Units into the skin 3 (three) times daily with meals. 10 mL 11   Melatonin 10 MG TABS Take 10 mg by mouth at bedtime.     [START ON 01/09/2022] metFORMIN (GLUCOPHAGE) 1000 MG tablet Take 1 tablet (1,000 mg total) by mouth daily with breakfast.     metoprolol tartrate (LOPRESSOR) 25 MG tablet Take 1 tablet (25 mg total) by mouth 2 (two) times daily. 60 tablet 0   Multiple Vitamin (MULTIVITAMIN WITH MINERALS) TABS tablet Take 1 tablet by mouth daily.  NITROSTAT 0.4 MG SL tablet PLACE 1 TABLET (0.4 MG TOTAL) UNDER THE TONGUE EVERY 5 (FIVE) MINUTES AS NEEDED FOR CHEST PAIN. (Patient taking differently: Place 0.4 mg under the tongue every 5 (five) minutes as needed for chest pain.) 25 tablet 5   Omega 3 1200 MG CAPS Take 1,200 mg by mouth daily.     [START ON 01/09/2022] polyethylene glycol (MIRALAX / GLYCOLAX) 17 g packet Take 17 g by mouth daily. 14 each 0   PRESCRIPTION MEDICATION See admin instructions. CPAP- At bedtime     QUEtiapine (SEROQUEL) 25 MG tablet Take 1/2 tablets (12.5 mg total) by mouth at bedtime. 15 tablet 0   traMADol (ULTRAM) 50 MG tablet Take 50 mg by mouth every 6 (six) hours as needed for moderate pain (lower back pain).        Home: Home Living Family/patient expects to be discharged to:: Private residence Living Arrangements: Spouse/significant other, Children Available Help at Discharge: Family Type of Home: Apartment Home Access: [place]: One level Bathroom Shower/Tub:  Public librarian, Holiday representative Toilet: Handicapped height Home Equipment: Civil engineer, contracting, Conservation officer, nature (2 wheels) Additional Comments: no family present during this OT eval and pt able to provide limited PLOF   Functional History: Prior Function Prior Level of Function : Needs assist Physical Assist : Mobility (physical), ADLs (physical) Mobility (physical): Transfers, Gait ADLs (physical): Bathing, Dressing, Toileting, IADLs Mobility Comments: Wife has had to provide more physical assist here lately with mobility; uses SPC (per OT note 12/21/21) ADLs Comments: Wife providing assist for all tasks per pt report set up for grooming tasks and self feeding.TD for LB dressibng/bathing tasks   Functional Status:  Mobility: Bed Mobility Overal bed mobility: Needs Assistance Bed Mobility: Rolling, Sidelying to Sit Rolling: Min assist Sidelying to sit: Min assist, HOB elevated Supine to sit: Max assist Sit to supine: Max assist, +2 for safety/equipment General bed mobility comments: assist for log roll to EOB, trunk elevation via HHA. Increased time and sequencing cues throughout Transfers Overall transfer level: Needs assistance Equipment used: Rolling walker (2 wheels) Transfers: Sit to/from Stand Sit to Stand: Min assist Bed to/from chair/wheelchair/BSC transfer type:: Stand pivot Stand pivot transfers: Min assist, From elevated surface General transfer comment: light power up and steadying assist, STS x4, from EOB x2 and recliner x2. Ambulation/Gait Ambulation/Gait assistance: Min assist, +2 safety/equipment (chair follow) Gait Distance (Feet): 40 Feet (+25, seated rest break needed) Assistive device: Rolling walker (2 wheels) Gait Pattern/deviations: Step-through pattern, Decreased stride length, Trunk flexed General Gait Details: min steadying assist, cues for upright posture, placement in RW Gait velocity: decr Pre-gait activities: Lateral weight shifting, standing  marches, lateral side stepping along EOB   ADL: ADL Overall ADL's : Needs assistance/impaired Eating/Feeding: Minimal assistance, Bed level Eating/Feeding Details (indicate cue type and reason): increased time to process bringing cup to mouth of thickned liquids with cues for small sips as per SLP recommendations Grooming: Minimal assistance, Set up, Sitting, Standing Grooming Details (indicate cue type and reason): Patient with kyphotic posture over the sink neding max multimodal cues to try and correct. Difficulty brushing teeth in standing position needing to sit to complete due to standing fatigue and balance. Upper Body Bathing: Maximal assistance, Bed level Lower Body Bathing: Total assistance, Bed level Upper Body Dressing : Maximal assistance, Bed level Lower Body Dressing: Bed level, Total assistance Toilet Transfer: Moderate assistance, Cueing for safety, Rolling walker (2 wheels) Toilet Transfer Details (indicate cue type and reason): Patient is able  to power up to standing at min A level however is unsteady with posterior bias needing cues to correct posture. Knees also buckling at times needing mod A to safely perform transfers. Toileting- Clothing Manipulation and Hygiene: Total assistance, Bed level Toileting - Clothing Manipulation Details (indicate cue type and reason): Condom cath fell off during treatment, CNA notified. Functional mobility during ADLs: Moderate assistance, Cueing for safety, Rolling walker (2 wheels)   Cognition: Cognition Overall Cognitive Status: Impaired/Different from baseline Orientation Level: Oriented to person, Oriented to place, Disoriented to time, Disoriented to situation Cognition Arousal/Alertness: Awake/alert Behavior During Therapy: WFL for tasks assessed/performed Overall Cognitive Status: Impaired/Different from baseline Area of Impairment: Orientation, Following commands, Safety/judgement, Problem solving Orientation Level: Disoriented  to, Situation Following Commands: Follows one step commands consistently, Follows one step commands with increased time Safety/Judgement: Decreased awareness of deficits, Decreased awareness of safety Problem Solving: Slow processing, Requires verbal cues, Requires tactile cues General Comments: pt initially alseep upon arrival to room, initially confused about PT arrival but after second explanation understands. Requires cuing for form/safety throughout mobility, enjoys making jokes during session  Height '5\' 8"'$  (1.727 m), weight 97.7 kg. Physical Exam Vitals and nursing note reviewed.  Constitutional:      Appearance: He is well-developed. HEENT: AC, NT Cardiac: RRR Pulmonary: breathing comfortably on room air Extremities: non-edematous Abdomen: protuberant, nondistended  Genitourinary:    Testes:        Right: Swelling not present.     Comments: Condom cath in place Neurological:     Mental Status: He is alert.     Comments: Oriented to self, place and date--situation "falls". Able to follow simple motor commands without difficulty.   Results for orders placed or performed during the hospital encounter of 01/08/22 (from the past 48 hour(s))  Glucose, capillary     Status: Abnormal   Collection Time: 01/08/22  4:41 PM  Result Value Ref Range   Glucose-Capillary 157 (H) 70 - 99 mg/dL    Comment: Glucose reference range applies only to samples taken after fasting for at least 8 hours.   No results found.    Height '5\' 8"'$  (1.727 m), weight 97.7 kg.  Medical Problem List and Plan: 1. Debility secondary to pancreatitis.   -patient may shower  -ELOS/Goals: 6-8 days S  -Admit to CIR 2.  Antithrombotics: -DVT/anticoagulation:  Pharmaceutical: Lovenox  -antiplatelet therapy: N/A 3. Pain Management: Tylenol BID decrease to 650 mg bid. Continue prn doses  -no reports of pain.  4. Mood: LCSW to follow for evaluation and support.   -antipsychotic agents: N/A 5. Neuropsych: This  patient is not capable of making decisions on his own behalf. 6. Skin/Wound Care: Routine pressure relief measures.  7. Fluids/Electrolytes/Nutrition: Monitor I/O. Check CMET in am 8. Pancreatitis: NO GI symptoms--has resolved.   9. Diverticulitis: Continue Augmentin. On Miralax and senna for bowel regimen.  10 Parkinson's disease: Recent admission for hallucinations. Discussed deep brain stimulation.   --continue Sinemet IR TID with Sinemet ER at bedtime.  11. CAD?Chronic diastolic CHF: Monitor for symtoms with increase in activity.  --Add low salt restrictions.  --Likely exacerbated by low protein stores. Add juven.  --Monitor daily weights and for signs of overload  --Transitioned to oral Lasix 03/09.  12. Narcolepsy/OSA: Continue CPAP 13. T2DM: BS poorly controlled likely due to supplements TID BETWEEN meals --Change to ensure Max, add juven and prostat for supplements also.  14. Leucocytosis: Continues to improve. Monitor for fevers and other signs of infection. 10.  Constipation: Continue Senna with mirlax. Resume probiotic.  11. Suboptimal vitamin D: start ergocalciferol 50,000 U once per week.  I have personally performed a face to face diagnostic evaluation, including, but not limited to relevant history and physical exam findings, of this patient and developed relevant assessment and plan.  Additionally, I have reviewed and concur with the physician assistant's documentation above.  Bary Leriche, PA-C   Izora Ribas, MD 01/08/2022

## 2022-01-08 NOTE — Assessment & Plan Note (Signed)
Continue PT OT recommending CIR-P2P initiated for CIR. ?

## 2022-01-08 NOTE — Progress Notes (Signed)
Izora Ribas, MD  Physician CASE MANAGEMENT PMR Pre-admission     Signed Date of Service:  01/07/2022  2:34 PM  Related encounter: ED to Hosp-Admission (Current) from 12/30/2021 in [facility]   Signed                                                                                                                                                                                                                                                                                                                                                                                                                                                               PMR Admission Coordinator Pre-Admission Assessment   Patient: ALYJAH LOVINGOOD is an 81 y.o., male MRN: [id number] DOB: 1941/02/26 Height: 5' 8"  (172.7 cm) Weight: 94.5 kg   Insurance Information HMO: Yes    PPO:       PCP:       IPA:       80/20:       OTHER:  PRIMARY: [employer] Medicare Advantage      Policy#: [id number]      Subscriber: patient CM Name:  Phone#:                          Fax#: [phone] Pre-Cert#: [id number] approved after peer to peer completed 01/08/22      Employer: Retired Benefits:  Phone #: [phone]     Name: [place]. Date: 11/01/21     Deduct: $0      Out of Pocket Max: [phone] (met $1257.36)      Life Max: N/A CIR: $325/day co-pay for days 1-6      SNF: $10 co-pay for days 1-20; $196 days 21-60; $0 days 61-100 Outpatient: med nec     Co-Pay: $20/visit Home Health: 100%      Co-Pay: none DME: 80%     Co-Pay: 20% Providers: in network  SECONDARY:       Policy#:      Phone#:    Development worker, community:       Phone#:    [employer] for patients in Inpatient Rehabilitation  Facilities with attached Privacy Act [facility] Records was provided and verbally reviewed with: Patient and Family   Emergency Contact Information Contact Information       Name Relation Home Work Mobile    Estill Spouse [phone]   [phone]           Current Medical History  Patient Admitting Diagnosis: Debility, pancreatitis   History of Present Illness:  An 81 y.o. male with medical history significant of hypertension ,noninsulin-dependent type 2 diabetes, CVA, Parkinson's, bradycardia-s/p PPM placement, OSA, narcolepsy, brought to ED by EMS due to due to abdominal pain, he was put on oxygen by EMS due to o2 sats at 73's, wife reports patient has sleep apnea.  I have reviewed his record which showed, he was hospitalized from 2/17 to 2/22 due to hallucination, Zoloft and Klonopin was discontinued, he was started on Seroquel which helped hallucination, he underwent kyphoplasty on 2/28 went home started have stomach pain and dry heave , wife called EMS.  Currently patient is tremorous wife states this is likely due to did not take his Sinemet today, currently patient report pain is much better, there is no fever, report had a regular bowel movement yesterday.  Denies dysuria.  Wife report patient has intermittent chronic cough which has not changed recently.  PT/OT evaluations completed with recommendations for post acute rehab program.     Patient's medical record from [facility] has been reviewed by the rehabilitation admission coordinator and physician.   Past Medical History      Past Medical History:  Diagnosis Date   Arthritis      R shoulder, bone spur   Arthritis      "hands" (03/25/2016)   Chronic diastolic heart failure (HCC)     Chronic lower back pain     Coronary heart disease      Dr Pernell Dupre   H/O cardiovascular stress test      perhaps last one was 2009   Heart murmur      12/29/11 echo: mild MR, no AS, trivial TR   History of  gout     Hyperlipidemia     Hypertension      saw Dr. Linard Millers last earl;y- 2014, cardiac cath. last done ?2009, blocks seen didn't require any intervention at that point.   Narcolepsy      [name] 04-11-97; Mean Latency 1.78mn, SOREM 2   OSA on CPAP  NPSG 12-13-98 AHI 22.7   PONV (postoperative nausea and vomiting)     Presence of permanent cardiac pacemaker     Shortness of breath      with exertion    Stroke ([place])     Type II diabetes mellitus ([place])        Has the patient had major surgery during 100 days prior to admission? Yes   Family History   family history includes Colon cancer in his sister; Sleep apnea in his sister.   Current Medications   Current Facility-Administered Medications:    acetaminophen (TYLENOL) tablet 1,000 mg, 1,000 mg, Oral, BID, Florencia Reasons, MD, 1,000 mg at 01/08/22 1035   amoxicillin-clavulanate (AUGMENTIN) 875-125 MG per tablet 1 tablet, 1 tablet, Oral, Q12H, Arrien, Jimmy Picket, MD, 1 tablet at 01/08/22 1035   atorvastatin (LIPITOR) tablet 20 mg, 20 mg, Oral, Daily, Florencia Reasons, MD, 20 mg at 01/08/22 1035   carbidopa-levodopa (SINEMET IR) 25-100 MG per tablet immediate release 1 tablet, 1 tablet, Oral, TID, Florencia Reasons, MD, 1 tablet at 01/08/22 1034   Carbidopa-Levodopa ER (SINEMET CR) 25-100 MG tablet controlled release 1 tablet, 1 tablet, Oral, QHS, Florencia Reasons, MD, 1 tablet at 01/07/22 2153   cholecalciferol (VITAMIN D3) tablet 1,000 Units, 1,000 Units, Oral, Daily, Florencia Reasons, MD, 1,000 Units at 01/08/22 1035   docusate sodium (COLACE) capsule 100 mg, 100 mg, Oral, BID, Kc, Ramesh, MD, 100 mg at 01/08/22 1034   enoxaparin (LOVENOX) injection 40 mg, 40 mg, Subcutaneous, Q24H, Lenis Noon, RPH, 40 mg at 01/07/22 2155   feeding supplement (GLUCERNA SHAKE) (GLUCERNA SHAKE) liquid 237 mL, 237 mL, Oral, TID BM, Florencia Reasons, MD, 237 mL at 01/08/22 1036   furosemide (LASIX) tablet 20 mg, 20 mg, Oral, Daily, Kc, Ramesh, MD, 20 mg at 01/08/22 1034   hydrALAZINE  (APRESOLINE) tablet 10 mg, 10 mg, Oral, BID, Florencia Reasons, MD, 10 mg at 01/08/22 1035   insulin aspart (novoLOG) injection 0-15 Units, 0-15 Units, Subcutaneous, TID WC, Florencia Reasons, MD, 3 Units at 01/08/22 1036   melatonin tablet 10 mg, 10 mg, Oral, QHS, Florencia Reasons, MD, 10 mg at 01/07/22 2154   metFORMIN (GLUCOPHAGE) tablet 1,000 mg, 1,000 mg, Oral, Q breakfast, Kc, Ramesh, MD, 1,000 mg at 01/08/22 1034   metoprolol tartrate (LOPRESSOR) tablet 25 mg, 25 mg, Oral, BID, Florencia Reasons, MD, 25 mg at 01/08/22 1035   multivitamin with minerals tablet 1 tablet, 1 tablet, Oral, Daily, Florencia Reasons, MD, 1 tablet at 01/08/22 1035   nitroGLYCERIN (NITROSTAT) SL tablet 0.4 mg, 0.4 mg, Sublingual, Q5 min PRN, Florencia Reasons, MD   omega-3 acid ethyl esters (LOVAZA) capsule 1 g, 1 g, Oral, Daily, Florencia Reasons, MD, 1 g at 01/08/22 1034   polyethylene glycol (MIRALAX / GLYCOLAX) packet 17 g, 17 g, Oral, Daily, Florencia Reasons, MD, 17 g at 01/08/22 1036   polyvinyl alcohol (LIQUIFILM TEARS) 1.4 % ophthalmic solution 1 drop, 1 drop, Both Eyes, PRN, Florencia Reasons, MD   QUEtiapine (SEROQUEL) tablet 12.5 mg, 12.5 mg, Oral, QHS, Florencia Reasons, MD, 12.5 mg at 01/07/22 2153   senna-docusate (Senokot-S) tablet 1 tablet, 1 tablet, Oral, BID, Florencia Reasons, MD, 1 tablet at 01/08/22 1035   traMADol (ULTRAM) tablet 50 mg, 50 mg, Oral, Q6H PRN, Florencia Reasons, MD   Patients Current Diet:  Diet Order                  Diet Carb Modified Fluid consistency: Thin; Room service appropriate? Yes  Diet effective now                         Precautions / Restrictions Precautions Precautions: Fall Precaution Comments: monitor vitals Restrictions Weight Bearing Restrictions: No    Has the patient had 2 or more falls or a fall with injury in the past year? Yes   Prior Activity Level Household: Martin Majestic out for MD appointments.   Prior Functional Level Self Care: Did the patient need help bathing, dressing, using the toilet or eating? Independent   Indoor Mobility: Did the  patient need assistance with walking from room to room (with or without device)? Independent   Stairs: Did the patient need assistance with internal or external stairs (with or without device)? Independent   Functional Cognition: Did the patient need help planning regular tasks such as shopping or remembering to take medications? Needed some help   Patient Information Are you of Hispanic, Latino/a,or Spanish origin?: A. No, not of Hispanic, Latino/a, or Spanish origin What is your race?: A. White Do you need or want an interpreter to communicate with a doctor or health care staff?: 0. No   Patient's Response To:  Health Literacy and Transportation Is the patient able to respond to health literacy and transportation needs?: Yes Health Literacy - How often do you need to have someone help you when you read instructions, pamphlets, or other written material from your doctor or pharmacy?: Sometimes In the past 12 months, has lack of transportation kept you from medical appointments or from getting medications?: No In the past 12 months, has lack of transportation kept you from meetings, work, or from getting things needed for daily living?: No   Development worker, international aid / [place] Devices/Equipment: Wellsite geologist, Wheelchair Home Equipment: Shower seat, Conservation officer, nature (2 wheels)   Prior Device Use: Indicate devices/aids used by the patient prior to current illness, exacerbation or injury?  T [place] chair   Current Functional Level Cognition   Overall Cognitive Status: Impaired/Different from baseline Orientation Level: Oriented to person, Oriented to place, Disoriented to time, Disoriented to situation Following Commands: Follows one step commands consistently, Follows one step commands with increased time Safety/Judgement: Decreased awareness of deficits, Decreased awareness of safety General Comments: pt initially alseep upon arrival to room, initially confused about PT  arrival but after second explanation understands. Requires cuing for form/safety throughout mobility, enjoys making jokes during session    Extremity Assessment (includes Sensation/Coordination)   Upper Extremity Assessment: Defer to OT evaluation RUE Deficits / Details: limited over head ROM, poor dexterity/fine motor, globally weak with 4/5 MMT RUE Coordination: decreased fine motor  Lower Extremity Assessment: Generalized weakness (B knee ext ~+3/5 (assessment limited by ability to follow commands))     ADLs   Overall ADL's : Needs assistance/impaired Eating/Feeding: Minimal assistance, Bed level Eating/Feeding Details (indicate cue type and reason): increased time to process bringing cup to mouth of thickned liquids with cues for small sips as per SLP recommendations Grooming: Minimal assistance, Set up, Sitting, Standing Grooming Details (indicate cue type and reason): Patient with kyphotic posture over the sink neding max multimodal cues to try and correct. Difficulty brushing teeth in standing position needing to sit to complete due to standing fatigue and balance. Upper Body Bathing: Maximal assistance, Bed level Lower Body Bathing: Total assistance, Bed level Upper Body Dressing : Maximal assistance, Bed level Lower Body Dressing: Bed level, Total assistance Toilet Transfer: Moderate assistance, Cueing for safety, Rolling walker (2  wheels) Toilet Transfer Details (indicate cue type and reason): Patient is able to power up to standing at min A level however is unsteady with posterior bias needing cues to correct posture. Knees also buckling at times needing mod A to safely perform transfers. Toileting- Clothing Manipulation and Hygiene: Total assistance, Bed level Toileting - Clothing Manipulation Details (indicate cue type and reason): Condom cath fell off during treatment, CNA notified. Functional mobility during ADLs: Moderate assistance, Cueing for safety, Rolling walker (2 wheels)      Mobility   Overal bed mobility: Needs Assistance Bed Mobility: Rolling, Sidelying to Sit Rolling: Min assist Sidelying to sit: Min assist, HOB elevated Supine to sit: Max assist Sit to supine: Max assist, +2 for safety/equipment General bed mobility comments: assist for log roll to EOB, trunk elevation via HHA. Increased time and sequencing cues throughout     Transfers   Overall transfer level: Needs assistance Equipment used: Rolling walker (2 wheels) Transfers: Sit to/from Stand Sit to Stand: Min assist Bed to/from chair/wheelchair/BSC transfer type:: Stand pivot Stand pivot transfers: Min assist, From elevated surface General transfer comment: light power up and steadying assist, STS x4, from EOB x2 and recliner x2.     Ambulation / Gait / Stairs / Wheelchair Mobility   Ambulation/Gait Ambulation/Gait assistance: Min assist, +2 safety/equipment (chair follow) Gait Distance (Feet): 40 Feet (+25, seated rest break needed) Assistive device: Rolling walker (2 wheels) Gait Pattern/deviations: Step-through pattern, Decreased stride length, Trunk flexed General Gait Details: min steadying assist, cues for upright posture, placement in RW Gait velocity: decr Pre-gait activities: Lateral weight shifting, standing marches, lateral side stepping along EOB     Posture / Balance Dynamic Sitting Balance Sitting balance - Comments: initially required min/mod A for sitting balance, then fair Balance Overall balance assessment: Needs assistance, History of Falls Sitting-balance support: Feet supported Sitting balance-Leahy Scale: Fair Sitting balance - Comments: initially required min/mod A for sitting balance, then fair Postural control: Posterior lean Standing balance support: Bilateral upper extremity supported, Reliant on assistive device for balance Standing balance-Leahy Scale: Poor Standing balance comment: reliant on AD     Special needs/care consideration Diabetic management  Yes h/o DM and Behavioral consideration Confused    Previous Home Environment (from acute therapy documentation) Living Arrangements: Spouse/significant other, Children Available Help at Discharge: Family Type of Home: Apartment Home Layout: One level Home Access: Elevator Bathroom Shower/Tub: Public librarian, Multimedia programmer: Handicapped height Home Care Services: Yes Additional Comments: no family present during this OT eval and pt able to provide limited PLOF   Discharge Living Setting Plans for Discharge Living Setting: Patient's home, Other (Comment), Lives with (comment) (Lives in a condo with wife and autistic son.) Type of Home at Discharge: Other (Comment) (condominium) Discharge Home Layout: One level (On the 10th floor.) Discharge Home Access: Elevator Discharge Bathroom Shower/Tub: Tub/shower unit, Walk-in shower, Curtain Discharge Bathroom Toilet: Handicapped height Discharge Bathroom Accessibility: Yes How Accessible: Accessible via walker Does the patient have any problems obtaining your medications?: No   Social/Family/Support Systems Patient Roles: Spouse, Parent (Has a wife and a 77 yo autistic son.) Contact Information: Agustine Rossitto - wife Anticipated Caregiver: wife Anticipated Caregiver's Contact Information: [name] - wife - [phone] Ability/Limitations of Caregiver: Wife can assist. Caregiver Availability: 24/7 Discharge Plan Discussed with Primary Caregiver: Yes Is Caregiver In Agreement with Plan?: Yes Does Caregiver/Family have Issues with Lodging/Transportation while Pt is in Rehab?: No   Goals Patient/Family Goal for Rehab: PT/OT mod I and supervision  goals Expected length of stay: 12 days Pt/Family Agrees to Admission and willing to participate: Yes Program Orientation Provided & Reviewed with Pt/Caregiver Including Roles  & Responsibilities: Yes   Decrease burden of Care through IP rehab admission: N/A   Possible need for SNF  placement upon discharge: Not planned   Patient Condition: I have reviewed medical records from [facility], spoken with CSW, and spouse. I discussed via phone for inpatient rehabilitation assessment.  Patient will benefit from ongoing PT and OT, can actively participate in 3 hours of therapy a day 5 days of the week, and can make measurable gains during the admission.  Patient will also benefit from the coordinated team approach during an Inpatient Acute Rehabilitation admission.  The patient will receive intensive therapy as well as Rehabilitation physician, nursing, social worker, and care management interventions.  Due to bladder management, bowel management, safety, skin/wound care, disease management, medication administration, pain management, and patient education the patient requires 24 hour a day rehabilitation nursing.  The patient is currently Min to mod assist with mobility and basic ADLs.  Discharge setting and therapy post discharge at home with home health is anticipated.  Patient has agreed to participate in the Acute Inpatient Rehabilitation Program and will admit today.   Preadmission Screen Completed By:  Retta Diones, 01/08/2022 11:48 AM ______________________________________________________________________   Discussed status with Dr. Ranell Patrick on 01/08/22 at 1149 and received approval for admission today.   Admission Coordinator:  Retta Diones, RN, time 1205/Date 01/08/22    Assessment/Plan: Diagnosis: Debility secondary to pancreatitis Does the need for close, 24 hr/day Medical supervision in concert with the patient's rehab needs make it unreasonable for this patient to be served in a less intensive setting? Yes Co-Morbidities requiring supervision/potential complications: class 1 obesity, OSA on CPAP, narcolepsy, type 2 DM, hyperlipidemia Due to bladder management, bowel management, safety, skin/wound care, disease management, medication administration, pain  management, and patient education, does the patient require 24 hr/day rehab nursing? Yes Does the patient require coordinated care of a physician, rehab nurse, PT, OT to address physical and functional deficits in the context of the above medical diagnosis(es)? Yes Addressing deficits in the following areas: balance, endurance, locomotion, strength, transferring, bowel/bladder control, bathing, dressing, feeding, grooming, toileting, and psychosocial support Can the patient actively participate in an intensive therapy program of at least 3 hrs of therapy 5 days a week? Yes The potential for patient to make measurable gains while on inpatient rehab is excellent Anticipated functional outcomes upon discharge from inpatient rehab: supervision PT, supervision OT, independent SLP Estimated rehab length of stay to reach the above functional goals is: 6-8 days Anticipated discharge destination: Home 10. Overall Rehab/Functional Prognosis: excellent     MD Signature: Leeroy Cha, MD         Revision History                                              Note Details  Author Izora Ribas, MD File Time 01/08/2022 12:09 PM  Author Type Physician Status Signed  Last Editor Izora Ribas, MD Service CASE MANAGEMENT

## 2022-01-08 NOTE — Progress Notes (Signed)
Patient has home CPAP for use and has self placed device.  ?

## 2022-01-08 NOTE — Progress Notes (Signed)
[facility] IP rehab admissions - I received from [employer] the offer of a peer to peer with medical director before a determination is made.  Attending MD can call [phone], select peer to peer and enter the reference number [id number].  This will need to be done as soon as possible this morning.  This will determine if Jordan [name] will approve CIR admission or SNF placement.  Call me for questions.  [phone] ?

## 2022-01-08 NOTE — Progress Notes (Deleted)
Physician Discharge Summary  RAVEN HARMES ZOX:[id number] DOB: 04-25-1941 DOA: 12/30/2021  PCP: [name], MD  Admit date: 12/30/2021 Discharge date: 01/08/2022  Admitted From: home Disposition:  CIR  Recommendations for Outpatient Follow-up:  Follow up with PCP in 1-2 weeks Please obtain BMP/CBC in one week  Home Health:none Equipment/Devices:none  Discharge Condition: Stable Code Status:   Code Status: Full Code Diet recommendation:  Diet Order             Diet Carb Modified Fluid consistency: Thin; Room service appropriate? Yes  Diet effective now                 Brief/Interim Summary: 81 yo male with the past medical history of hypertension, T2DM, Parkinson's disease, narcolepsy, OSA and bradycardia sp pacer who presented with abdominal pain. Reported severe abdominal pain, EMS was called and patient was brought to the hospital. Recent hospitalization On 02/17 to 02/22 for hallucinations. 02/28 had kypho-plasty. On his initial physical examination his blood pressure was 176/84, HR 71, RR 18 to 25, 02 saturation 97% and temp 99,2. His lungs were clear to auscultation, heart with S1 and S2 present and rhythmic, abdomen not distended, no lower extremity edema.  Na 137, K 4,9, CL 102, bicarbonate 24, glucose 185, bun 31 and cr 0,97Lactic acid 2,7 Wbc 22.0, hgb 14.7 hvt 43,6 and plt 231 Urine analysis sg 1,026 with negative leukocytes. CT abdomen with new inflammatory change within the pancreatic tail and surrounding mesenteric fat extending to the hepatic flexure of the colon with mild inflammatory changes and mild wall thickening.  Mr. Glasscock was admitted to the hospital with the working diagnosis of pancreatitis, proximal sigmoid diverticulitis, was placed on antibiotic therapy and IV fluids.3/05 developed volume overload and required diuresis with furosemide. Patient found to have metabolic encephalopathy with delirium. Wife is hoping for CIR-PT recommended CIR. Peer to peer  review on 3/10  and approved and is being discharged to inpatient rehab today in medically stable condition.  Discharge Diagnoses:  * Pancreatitis With proximal sigmoid diverticulitis Sepsis POA: Sepsis-resolved,vitals stable. He had significant leukocytosis and now down-trending-tolerating diet, cont augmentin to complete course. Cont Miralax,add coalce. Recent Labs  Lab 01/02/22 0511 01/04/22 0455 01/06/22 0527 01/07/22 0502  WBC 14.3* 13.9* 14.1* 13.8*    Acute metabolic encephalopathy Improved at baseline.  Continue supportive care. CPAP nightly,if recurrent somnolence may need ABG and Bipap,suspected obesity hypoventilation syndrome. Continue to hold on haloperidol, avoid sedative medications.   Essential hypertension Leg edema/volume overload Cough and wheezing. BP is controlled, resp status improving and stable.Continue hydralazine metoprolol.  S/p IV Lasix for edema, echo  W/ EF 60 to 65%, mild concentric LVH, indeterminate LV diastolic function- transitioned to PO lasix.  OSA on CPAP/narcolepsy  Continue with Cpap.   Type 2 diabetes mellitus with hyperlipidemia (HCC) A1c stable at 6.1,CBG with uncontrolled hyperglycemia in 200s, back on Metformin and continue SSI.Continue statin. Recent Labs  Lab 01/07/22 0808 01/07/22 1216 01/07/22 1732 01/07/22 2143 01/08/22 0735  GLUCAP 201* 238* 167* 255* 166*    Parkinson's disease (HCC) Continue his Sinemet,seroquel melatonin continue OOB PT OT, supportive care.  Hypomagnesemia Hypokalemia/ hyponatremia:  Improved  Class 1 obesity Calculated BMI is 31.6   Physical deconditioning Continue PT OT recommending CIR-P2P initiated for CIR.  Constipation Continue MiraLAX and stool softener.  Consults: Cir   Subjective: Alert awake resting comfortably no complaint complains of constipation Discharge Exam: Vitals:   01/07/22 2252 01/08/22 0437  BP:  113/76  Pulse:  [name])  59  Resp: 17 18  Temp:  98.7 F (37.1 C)   SpO2:  95%   General: Pt is alert, awake, not in acute distress Cardiovascular: RRR, S1/S2 +, no rubs, no gallops Respiratory: CTA bilaterally, no wheezing, no rhonchi Abdominal: Soft, NT, ND, bowel sounds + Extremities: no edema, no cyanosis  Discharge Instructions  Discharge Instructions     Amb Referral to Palliative Care   Complete by: As directed    Discharge instructions   Complete by: As directed    Check CBC BMP in 1 day   Increase activity slowly   Complete by: As directed       Allergies as of 01/08/2022   No Known Allergies      Medication List     STOP taking these medications    lidocaine 5 % Commonly known as: LIDODERM   lisinopril 20 MG tablet Commonly known as: ZESTRIL       TAKE these medications    acetaminophen 500 MG tablet Commonly known as: TYLENOL Take 1,000 mg by mouth 2 (two) times daily.   amoxicillin-clavulanate 875-125 MG tablet Commonly known as: AUGMENTIN Take 1 tablet by mouth every 12 (twelve) hours for 4 days.   amphetamine-dextroamphetamine 10 MG tablet Commonly known as: ADDERALL Take 10 mg by mouth daily as needed (help control symptoms of parkinsons).   atorvastatin 40 MG tablet Commonly known as: LIPITOR Take 0.5 tablets (20 mg total) by mouth daily.   bifidobacterium infantis capsule Take 1 capsule by mouth daily.   Carbidopa-Levodopa ER 25-100 MG tablet controlled release Commonly known as: SINEMET CR Take 1 tablet by mouth at bedtime.   carbidopa-levodopa 25-100 MG tablet Commonly known as: Sinemet Take 1 tablet by mouth 3 (three) times daily.   cholecalciferol 25 MCG (1000 UNIT) tablet Commonly known as: VITAMIN D3 Take 1,000 Units by mouth daily.   CO Q-10 PO Take 1 capsule by mouth daily.   docusate sodium 100 MG capsule Commonly known as: COLACE Take 1 capsule (100 mg total) by mouth 2 (two) times daily.   feeding supplement (GLUCERNA SHAKE) Liqd Take 237 mLs by mouth 3 (three) times daily  between meals.   furosemide 20 MG tablet Commonly known as: LASIX Take 1 tablet (20 mg total) by mouth daily for 7 days. Start taking on: January 09, 2022   hydrALAZINE 10 MG tablet Commonly known as: APRESOLINE Take 1 tablet (10 mg total) by mouth 2 (two) times daily.   insulin aspart 100 UNIT/ML injection Commonly known as: novoLOG Inject 0-15 Units into the skin 3 (three) times daily with meals.   Melatonin 10 MG Tabs Take 10 mg by mouth at bedtime.   metFORMIN 1000 MG tablet Commonly known as: GLUCOPHAGE Take 1 tablet (1,000 mg total) by mouth daily with breakfast. Start taking on: January 09, 2022 What changed:  medication strength how much to take additional instructions   metoprolol tartrate 25 MG tablet Commonly known as: LOPRESSOR Take 1 tablet (25 mg total) by mouth 2 (two) times daily.   multivitamin with minerals Tabs tablet Take 1 tablet by mouth daily.   Nitrostat 0.4 MG SL tablet Generic drug: nitroGLYCERIN PLACE 1 TABLET (0.4 MG TOTAL) UNDER THE TONGUE EVERY 5 (FIVE) MINUTES AS NEEDED FOR CHEST PAIN. What changed: how much to take   Omega 3 1200 MG Caps Take 1,200 mg by mouth daily.   polyethylene glycol 17 g packet Commonly known as: MIRALAX / GLYCOLAX Take 17 g by mouth daily. Start  taking on: January 09, 2022   PRESCRIPTION MEDICATION See admin instructions. CPAP- At bedtime   QUEtiapine 25 MG tablet Commonly known as: SEROQUEL Take 1/2 tablets (12.5 mg total) by mouth at bedtime.   traMADol 50 MG tablet Commonly known as: ULTRAM Take 50 mg by mouth every 6 (six) hours as needed for moderate pain (lower back pain).        Contact information for after-discharge care     Destination     [facility] Preferred SNF .   Service: Skilled Nursing Contact information: [place] [phone]                    No Known Allergies  The results of significant  diagnostics from this hospitalization (including imaging, microbiology, ancillary and laboratory) are listed below for reference.    Microbiology: Recent Results (from the past 240 hour(s))  Culture, blood (Routine X 2) w Reflex to ID Panel     Status: None   Collection Time: 12/30/21 11:18 AM   Specimen: BLOOD  Result Value Ref Range Status   Specimen Description   Final    BLOOD RIGHT ANTECUBITAL Performed at [facility] [address], [place], [place] [address]    Special Requests   Final    BOTTLES DRAWN AEROBIC AND ANAEROBIC Blood Culture results may not be optimal due to an excessive volume of blood received in culture bottles Performed at [facility] [address], [place], [place] [address]    Culture   Final    NO GROWTH 5 DAYS Performed at [facility], [place] [address] [place], [place] [address]    Report Status 01/04/2022 FINAL  Final  Culture, blood (Routine X 2) w Reflex to ID Panel     Status: None   Collection Time: 12/30/21 11:23 AM   Specimen: BLOOD  Result Value Ref Range Status   Specimen Description   Final    BLOOD LEFT ANTECUBITAL Performed at [facility] [address] [place], [place] [address]    Special Requests   Final    BOTTLES DRAWN AEROBIC AND ANAEROBIC Blood Culture results may not be optimal due to an excessive volume of blood received in culture bottles Performed at [facility] [address], [place], [place] [address]    Culture   Final    NO GROWTH 5 DAYS Performed at [facility], [place] [address], [place], [place] [address]    Report Status 01/04/2022 FINAL  Final  Resp Panel by RT-PCR (Flu A&B, Covid) Nasopharyngeal Swab     Status: None   Collection Time: 12/30/21 11:24 AM   Specimen: Nasopharyngeal Swab; Nasopharyngeal(NP) swabs in vial transport medium  Result Value Ref Range Status   SARS Coronavirus 2 by RT PCR NEGATIVE NEGATIVE Final     Comment: (NOTE) SARS-CoV-2 target nucleic acids are NOT DETECTED.  The SARS-CoV-2 RNA is generally detectable in upper respiratory specimens during the acute phase of infection. The lowest concentration of SARS-CoV-2 viral copies this assay can detect is 138 copies/mL. A negative result does not preclude SARS-Cov-2 infection and should not be used as the sole basis for treatment or other patient management decisions. A negative result may occur with  improper specimen collection/handling, submission of specimen other than nasopharyngeal swab, presence of viral mutation(s) within the areas targeted by this assay, and inadequate number of viral copies(<138 copies/mL). A  negative result must be combined with clinical observations, patient history, and epidemiological information. The expected result is Negative.  Fact Sheet for Patients:  EntrepreneurPulse.com.au  Fact Sheet for Healthcare Providers:  IncredibleEmployment.be  This test is no t yet approved or cleared by the [place] FDA and  has been authorized for detection and/or diagnosis of SARS-CoV-2 by FDA under an Emergency Use Authorization (EUA). This EUA will remain  in effect (meaning this test can be used) for the duration of the COVID-19 declaration under Section 564(b)(1) of the Act, 21 U.S.C.section 360bbb-3(b)(1), unless the authorization is terminated  or revoked sooner.       Influenza A by PCR NEGATIVE NEGATIVE Final   Influenza B by PCR NEGATIVE NEGATIVE Final    Comment: (NOTE) The Xpert Xpress SARS-CoV-2/FLU/RSV plus assay is intended as an aid in the diagnosis of influenza from Nasopharyngeal swab specimens and should not be used as a sole basis for treatment. Nasal washings and aspirates are unacceptable for Xpert Xpress SARS-CoV-2/FLU/RSV testing.  Fact Sheet for Patients: EntrepreneurPulse.com.au  Fact Sheet for Healthcare  Providers: IncredibleEmployment.be  This test is not yet approved or cleared by the [place] FDA and has been authorized for detection and/or diagnosis of SARS-CoV-2 by FDA under an Emergency Use Authorization (EUA). This EUA will remain in effect (meaning this test can be used) for the duration of the COVID-19 declaration under Section 564(b)(1) of the Act, 21 U.S.C. section 360bbb-3(b)(1), unless the authorization is terminated or revoked.  Performed at [facility], [place] [address] [place], [place] [address]   MRSA Next Gen by PCR, Nasal     Status: None   Collection Time: 12/30/21  3:30 PM   Specimen: Nasal Mucosa; Nasal Swab  Result Value Ref Range Status   MRSA by PCR Next Gen NOT DETECTED NOT DETECTED Final    Comment: (NOTE) The GeneXpert MRSA Assay (FDA approved for NASAL specimens only), is one component of a comprehensive MRSA colonization surveillance program. It is not intended to diagnose MRSA infection nor to guide or monitor treatment for MRSA infections. Test performance is not FDA approved in patients less than 9 years old. Performed at [facility], [place] [address], [place], [place] [address]   Urine Culture     Status: None   Collection Time: 12/31/21  6:45 PM   Specimen: Urine, Clean Catch  Result Value Ref Range Status   Specimen Description   Final    URINE, CLEAN CATCH Performed at [facility], [place] [address], [place], [place] [address]    Special Requests   Final    NONE Performed at [facility],  [address], [place], [place] [address]    Culture   Final    NO GROWTH Performed at [facility], [place] [address], [place], [place] [address]    Report Status 01/02/2022 FINAL  Final    Procedures/Studies: CT Head Wo Contrast  Result Date: 12/18/2021 CLINICAL DATA:  Neuro deficit, acute stroke suspected. EXAM: CT HEAD WITHOUT CONTRAST  TECHNIQUE: Contiguous axial images were obtained from the base of the skull through the vertex without intravenous contrast. RADIATION DOSE REDUCTION: This exam was performed according to the departmental dose-optimization program which includes automated exposure control, adjustment of the mA and/or kV according to patient size and/or use of iterative reconstruction technique. COMPARISON:  CT head dated June 30, 2021 FINDINGS: Brain: No evidence of acute infarction, hemorrhage, hydrocephalus, extra-axial collection or mass lesion/mass  effect. Moderate cerebral atrophy and chronic microvascular ischemic changes of the white matter, unchanged. Bilateral chronic thalamic lacunar infarcts. Vascular: No hyperdense vessel or unexpected calcification. Skull: Normal. Negative for fracture or focal lesion. Sinuses/Orbits: Partial opacification of the left maxillary sinus, complete opacification of the left frontal sinus and patchy opacification of ethmoid air cells. Other: None. IMPRESSION: 1.  No acute intracranial abnormality. 2. Cerebral atrophy, white matter ischemic changes and chronic lacunar infarcts of bilateral thalami. 3.  Chronic paranasal sinus disease. Electronically Signed   By: Keane Police D.O.   On: 12/18/2021 14:19   MR CERVICAL SPINE WO CONTRAST  Result Date: 12/22/2021 CLINICAL DATA:  Myelopathy, chronic, cervical spine EXAM: MRI CERVICAL SPINE WITHOUT CONTRAST TECHNIQUE: Multiplanar, multisequence MR imaging of the cervical spine was performed. No intravenous contrast was administered. COMPARISON:  None. FINDINGS: Alignment: Reversal of the normal cervical lordosis. Mild retrolisthesis of C5 on C6. Vertebrae: Vertebral body heights are maintained. No focal marrow edema to suggest acute fracture discitis/osteomyelitis. Cord: Normal cord signal. Posterior Fossa, vertebral arteries, paraspinal tissues: Visualized vertebral artery flow voids are maintained. No evidence of acute abnormality in the  visualized posterior fossa on limited assessment. Disc levels: C2-C3: Suspected partial osseous fusion across the disc space. No significant disc protrusion or canal stenosis. Left greater than right facet hypertrophy with mild left foraminal stenosis. Patent right foramen. C3-C4: Suspected partial osseous fusion across the disc space. Bilateral facet and uncovertebral hypertrophy. Mild bilateral foraminal stenosis. No significant canal stenosis. C4-C5: Posterior disc osteophyte complex with bilateral facet and uncovertebral hypertrophy. Resulting mild canal and bilateral foraminal stenosis. C5-C6: Posterior disc osteophyte complex with ligamentum flavum thickening. Resulting mild to moderate canal stenosis and moderate bilateral foraminal stenosis. C6-C7: Small posterior disc osteophyte complex with bilateral facet hypertrophy. Mild left foraminal stenosis without significant canal or right foraminal stenosis. C7-T1: No significant disc protrusion, foraminal stenosis, or canal stenosis. IMPRESSION: 1. 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 2. At C4-C5, mild canal and bilateral foraminal stenosis. 3. Additional mild foraminal stenosis on the left at C2-C3 and C6-C7 and bilaterally at C3-C4. Electronically Signed   By: Margaretha Sheffield M.D.   On: 12/22/2021 14:08   CT Abdomen Pelvis W Contrast  Result Date: 12/30/2021 CLINICAL DATA:  Acute nonlocalized abdominal pain starting yesterday. Had back surgery yesterday started on pain meds. Nausea and vomiting. Has some diarrhea irregularity. EXAM: CT ABDOMEN AND PELVIS WITH CONTRAST TECHNIQUE: Multidetector CT imaging of the abdomen and pelvis was performed using the standard protocol following bolus administration of intravenous contrast. RADIATION DOSE REDUCTION: This exam was performed according to the departmental dose-optimization program which includes automated exposure control, adjustment of the mA and/or kV  according to patient size and/or use of iterative reconstruction technique. CONTRAST:  143m OMNIPAQUE IOHEXOL 300 MG/ML  SOLN COMPARISON:  CT abdomen and pelvis without contrast 10/29/2021; CT abdomen and pelvis without and with contrast 01/23/2014 FINDINGS: Lower chest: Small right and trace left pleural effusions new from 10/29/2021. Associated mild subsegmental atelectasis. Heart size is mildly to moderately enlarged. Coronary artery calcifications and cardiac pacer wires are noted. 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 Hepatobiliary: Smooth liver contours. There are 3 adjacent oval lesions versus a single multilobular lesion within the posterior right hepatic lobe measuring up to approximally 4.2 x 2.3 x 4.2 cm (transverse by AP by craniocaudal), not significantly changed from 10/29/2021 within limitations of lack of IV contrast on the prior study.  In this region there is again a 4 mm dystrophic calcification. Again the calcification is unchanged but the low-density lobular lesion is new compared to 01/23/2014. a more inferior posterior right hepatic lobe 17 mm low-attenuation lesion (axial series 3, image 28) is unchanged from 01/23/2014. More superior anterior right hepatic lobe 10 mm low-attenuation lesion (axial series 3, image 20) is unchanged from 01/23/2014. The gallbladder is unremarkable. No intrahepatic or extrahepatic biliary ductal dilatation. Pancreas: There is new swelling and surrounding moderate inflammatory fat stranding about the pancreatic tail (axial series 3, images 26-33). There is also mild thickening of the adjacent splenic flexure colon in this region. Spleen: Normal in size without focal abnormality. Adrenals/Urinary Tract: Adrenal glands are unremarkable. Mild-to-moderate sigmoid and descending colon diverticulosis. As described above there is inflammatory change in the region of the pancreatic tail and adjacent hepatic flexure of  the colon. The kidneys enhance uniformly and are symmetric in size without hydronephrosis. No renal stone is seen. Posterior inferior right 2.9 cm and mid to superior left 2.4 cm fluid attenuation renal cysts are seen. There are again multiple punctate left-greater-than-right nonobstructing renal stones. Left renal pelvis partially obstructing 12 mm stone is unchanged from 01/23/2014 with unchanged long-term stable minimal left pelvicaliectasis. The urinary bladder is grossly unremarkable, within limitation of underdistention. Stomach/Bowel: No focal bowel wall thickening. The terminal ileum is unremarkable. The appendix appears within normal limits. No dilated loops of bowel to indicate bowel obstruction. Vascular/Lymphatic: No abdominal aortic aneurysm. Moderate atherosclerotic calcification. No mesenteric, retroperitoneal, or pelvic lymphadenopathy. Reproductive: The prostate and seminal vesicles are grossly unremarkable. Other: No abdominal wall hernia or abnormality.No abdominopelvic ascites. No pneumoperitoneum. Musculoskeletal: Mild levocurvature centered at L2. There is new cement seen within L2 vertebral body interval vertebroplasty. Moderate L2 vertebral body height loss is similar to prior. There moderate multilevel degenerative disc changes greatest at L4-5. Mild progressive healing sclerosis of a subacute to older left inferior pubic ramus fracture (axial series 3, image 99). IMPRESSION: Compared to 10/29/2021: 1. Interval L2 vertebral body vertebroplasty. 2. There is new inflammatory change within the pancreatic tail and surrounding mesenteric fat extending to the hepatic flexure of the colon where there is also mild wall thickening and inflammatory change. Mild distal colonic diverticulosis. The inflammatory changes may represent primary pancreatitis with secondary inflammation of the hepatic flexure of the colon, or primary proximal sigmoid colon diverticulitis with secondary extension to the  pancreatic tail. 3. No abscess or drainable fluid collection. 4. Subacute or older inferior left pubic ramus nondisplaced fracture. Electronically Signed   By: Yvonne Kendall M.D.   On: 12/30/2021 14:30   DG CHEST PORT 1 VIEW  Result Date: 01/03/2022 CLINICAL DATA:  Shortness of breath EXAM: PORTABLE CHEST 1 VIEW COMPARISON:  December 30, 2021 FINDINGS: The cardiomediastinal silhouette is unchanged in contour.LEFT chest cardiac pacing device. Atherosclerotic calcifications of the aorta. Low lung volume film. No pleural effusion. No pneumothorax. Mildly increased perihilar vascular fullness and interstitial prominence. LEFT basilar platelike opacity. Visualized abdomen is unremarkable. IMPRESSION: Constellation of findings are favored to reflect mild pulmonary edema with LEFT basilar atelectasis. Electronically Signed   By: Valentino Saxon M.D.   On: 01/03/2022 13:09   DG Chest Port 1 View  Result Date: 12/30/2021 CLINICAL DATA:  Cough.  Generalized abdominal pain. EXAM: PORTABLE CHEST 1 VIEW COMPARISON:  12/18/2021 FINDINGS: A dual lead pacemaker remains in place. The cardiac silhouette remains enlarged. Aortic atherosclerosis is noted. Lung volumes remain low with mild left basilar opacity which is stable  to slightly increased. No edema, sizable pleural effusion, or pneumothorax is identified. No acute osseous abnormality is seen. IMPRESSION: Low lung volumes with left basilar atelectasis. Electronically Signed   By: Logan Bores M.D.   On: 12/30/2021 11:46   DG Chest Port 1 View  Result Date: 12/18/2021 CLINICAL DATA:  Shortness of breath. EXAM: PORTABLE CHEST 1 VIEW COMPARISON:  06/21/2020. FINDINGS: Trachea is midline. Heart is enlarged. Thoracic aorta is calcified. Pacemaker lead tips are in the right atrium and right ventricle. Lungs are somewhat low in volume with minimal left basilar subsegmental volume loss. No pleural fluid. IMPRESSION: No acute findings. Electronically Signed   By: Lorin Picket  M.D.   On: 12/18/2021 14:34   DG Swallowing Func-Speech Pathology  Result Date: 01/04/2022 Table formatting from the original result was not included. Objective Swallowing Evaluation: Type of Study: MBS-Modified Barium Swallow Study  Patient Details Name: TODDY BOYD MRN: [id number] Date of Birth: 07/12/1941 Today's Date: 01/04/2022 Time: SLP Start Time (ACUTE ONLY): 1205 -SLP Stop Time (ACUTE ONLY): 1225 SLP Time Calculation (min) (ACUTE ONLY): 20 min Past Medical History: Past Medical History: Diagnosis Date  Arthritis   R shoulder, bone spur  Arthritis   "hands" (03/25/2016)  Chronic diastolic heart failure (HCC)   Chronic lower back pain   Coronary heart disease   Dr Pernell Dupre  H/O cardiovascular stress test   perhaps last one was 2009  Heart murmur   12/29/11 echo: mild MR, no AS, trivial TR  History of gout   Hyperlipidemia   Hypertension   saw Dr. Linard Millers last earl;y- 2014, cardiac cath. last done ?2009, blocks seen didn't require any intervention at that point.  Narcolepsy   [name] 04-11-97; Mean Latency 1.28mn, SOREM 2  OSA on CPAP   NPSG 12-13-98 AHI 22.7  PONV (postoperative nausea and vomiting)   Presence of permanent cardiac pacemaker   Shortness of breath   with exertion   St[place]   Type II diabetes mellitus ([place]  Past Surgical History: Past Surgical History: Procedure Laterality Date  CARDIAC CATHETERIZATION  2009?  EP IMPLANTABLE DEVICE N/A 03/25/2016  Procedure: Pacemaker Implant - Dual Chamber;  Surgeon: GEvans Lance MD;  [facility];  Service: Cardiovascular;  Laterality: N/A;  FRACTURE SURGERY    INSERT / REPLACE / REMOVE PACEMAKER  03/24/2016  IR KYPHO LUMBAR INC FX REDUCE BONE BX UNI/BIL CANNULATION INC/IMAGING  12/29/2021  IR RADIOLOGIST EVAL & MGMT  12/15/2021  LEFT AND RIGHT HEART CATHETERIZATION WITH CORONARY ANGIOGRAM N/A 06/05/2014  Procedure: LEFT AND RIGHT HEART CATHETERIZATION WITH CORONARY ANGIOGRAM;  Surgeon: HSinclair Grooms MD;  [facility];  Service:  Cardiovascular;  Laterality: N/A;  NASAL FRACTURE SURGERY    SHOULDER ARTHROSCOPY WITH ROTATOR CUFF REPAIR AND SUBACROMIAL DECOMPRESSION Right 08/16/2013  Procedure: SHOULDER ARTHROSCOPY WITH ROTATOR CUFF REPAIR AND SUBACROMIAL DECOMPRESSION;  Surgeon: JNita Sells MD;  [place] [facility]  Service: Orthopedics;  Laterality: Right;  Right shoulder arthroscopy rotator cuff repair, subacromial decompression.  TONSILLECTOMY   HPI: Per MD note, [facility] 81[facility] suspected due to medications - Seroquel was started.  Pt underwent kyphoplasty on 2/28 went home started have stomach pain and dry heave.  PMH of hypertension ,noninsulin-dependent type 2 diabetes, CVA, Parkinson's, bradycardia-s/p PPM placement, OSA, narcolepsy.  He was brought to the ED by EMS due to abdominal pain - put on oxygen by EMS due  to o2 sats at 90's.  Wife reported to Md that patient has sleep apnea.  Wife reported to MD that patient has intermittent chronic cough.  CT abdomen Lower chest: Small right and trace left pleural effusions new from 10/29/2021.  Per MD note, Sepsis present on admission with tachypnea, leukocytosis, lactic acidosis, fever, source of infection likely intra-abdominal  - initial CT concerning for Pancreatitis versus proximal sigmoid colon diverticulitis, with elevated lipase, normal LFT.  Swallow evaluation ordered per RN discussion with wife and concern for pt having dysphagia. RN and NT report pt has not consumed po intake today due to his mentation.  Per prior brain imaging studies -Chronic infarct left frontal lobe anteriorly. Small  chronic infarcts in the cerebellum bilaterally. Chronic infarcts right and left thalamic.  Subjective: cooperative, fatigued  Recommendations for follow up therapy are one component of a multi-disciplinary discharge planning process, led by the attending physician.  Recommendations may be updated based on patient status, additional functional criteria  and insurance authorization. Assessment / Plan / Recommendation Clinical Impressions 01/04/2022 Clinical Impression Patient presents with a mild-moderate oral and a mild pharyngeal phase dysphagia. During oral phase, patient with decreased bolus cohesion with barium tablet in puree solids, requiring thin liquid barium sip to transit. He exhibited a munch chewing pattern with graham cracker. He did exhibit decreased anterior to posterior movement of liquids as well but with good oral containment of bolus and no premature spillage in vallecular sinus observed. During pharyngeal phase of swallow, patient exhibited a couple instances of mildly delayed swallow initiation at level of vallecular sinus and trace amount of vallecular residuals observed post initial swallows with thin liquids. No penetration or aspiration observed and cricopharyngeal phase of swallow appeared [facility] with no backflow of barium. Esophageal sweep did not reveal any significant s/s of dysmotility or stasis of barium. Pharyngeal phase of swallow was [facility] for puree solids, barium tablet in puree and regular texture solids. SLP is recommending to upgrade liquids to thin and continue with regular solids. SLP Visit Diagnosis Dysphagia, oropharyngeal phase (R13.12) Attention and concentration deficit following -- Frontal lobe and executive function deficit following -- Impact on safety and function Mild aspiration risk   Treatment Recommendations 01/04/2022 Treatment Recommendations Therapy as outlined in treatment plan below   Prognosis 01/04/2022 Prognosis for Safe Diet Advancement Good Barriers to Reach Goals -- Barriers/Prognosis Comment -- Diet Recommendations 01/04/2022 SLP Diet Recommendations Regular solids;Thin liquid Liquid Administration via Cup;Straw Medication Administration Whole meds with liquid Compensations Slow rate;Small sips/bites Postural Changes Seated upright at 90 degrees   Other Recommendations 01/04/2022 Recommended Consults -- Oral Care  Recommendations Oral care BID;Staff/trained caregiver to provide oral care Other Recommendations -- Follow Up Recommendations Skilled nursing-short term rehab (<3 hours/day) Assistance recommended at discharge Frequent or constant Supervision/Assistance Functional Status Assessment -- Frequency and Duration  01/04/2022 Speech Therapy Frequency (ACUTE ONLY) min 1 x/week Treatment Duration 1 week   Oral Phase 01/04/2022 Oral Phase Impaired Oral - Pudding Teaspoon -- Oral - Pudding Cup -- Oral - Honey Teaspoon -- Oral - Honey Cup -- Oral - Nectar Teaspoon -- Oral - Nectar Cup Delayed oral transit;Reduced posterior propulsion;Weak lingual manipulation Oral - Nectar Straw -- Oral - Thin Teaspoon -- Oral - Thin Cup Reduced posterior propulsion;Delayed oral transit;Weak lingual manipulation Oral - Thin Straw Delayed oral transit;Weak lingual manipulation;Reduced posterior propulsion Oral - Puree Delayed oral transit;Reduced posterior propulsion;Weak lingual manipulation Oral - Mech Soft -- Oral - Regular Reduced posterior propulsion;Delayed oral transit;Impaired mastication Oral -  Multi-Consistency -- Oral - Pill Decreased bolus cohesion;Delayed oral transit;Weak lingual manipulation;Reduced posterior propulsion Oral Phase - Comment --  Pharyngeal Phase 01/04/2022 Pharyngeal Phase Impaired Pharyngeal- Pudding Teaspoon -- Pharyngeal -- Pharyngeal- Pudding Cup -- Pharyngeal -- Pharyngeal- Honey Teaspoon -- Pharyngeal -- Pharyngeal- Honey Cup -- Pharyngeal -- Pharyngeal- Nectar Teaspoon -- Pharyngeal -- Pharyngeal- Nectar Cup WFL Pharyngeal -- Pharyngeal- Nectar Straw -- Pharyngeal -- Pharyngeal- Thin Teaspoon -- Pharyngeal -- Pharyngeal- Thin Cup Delayed swallow initiation-vallecula;Pharyngeal residue - valleculae Pharyngeal -- Pharyngeal- Thin Straw Pharyngeal residue - valleculae Pharyngeal -- Pharyngeal- Puree -- Pharyngeal -- Pharyngeal- Mechanical Soft -- Pharyngeal -- Pharyngeal- Regular WFL Pharyngeal -- Pharyngeal-  Multi-consistency -- Pharyngeal -- Pharyngeal- Pill WFL Pharyngeal -- Pharyngeal Comment --  Cervical Esophageal Phase  01/04/2022 Cervical Esophageal Phase WFL Pudding Teaspoon -- Pudding Cup -- Honey Teaspoon -- Honey Cup -- Nectar Teaspoon -- Nectar Cup -- Nectar Straw -- Thin Teaspoon -- Thin Cup -- Thin Straw -- Puree -- Mechanical Soft -- Regular -- Multi-consistency -- Pill -- Cervical Esophageal Comment -- Sonia Baller, MA, CCC-SLP Speech Therapy                     IR KYPHO LUMBAR INC FX REDUCE BONE BX UNI/BIL CANNULATION INC/IMAGING  Result Date: 12/31/2021 INDICATION: Low back pain secondary to compression fracture at L2. EXAM: BALLOON KYPHOPLASTY AT L2 COMPARISON:  MRI of the lumbosacral spine of November 19, 2021. MEDICATIONS: As antibiotic prophylaxis, Ancef 2 g IV was ordered pre-procedure and administered intravenously within 1 hour of incision. ANESTHESIA/SEDATION: Moderate (conscious) sedation was employed during this procedure. A total of Versed 2 mg and Fentanyl 50 mcg was administered intravenously by the radiology nurse. Total intra-service moderate Sedation Time: 35 minutes. The patient's level of consciousness and vital signs were monitored continuously by radiology nursing throughout the procedure under my direct supervision. FLUOROSCOPY: Radiation Exposure Index (as provided by the fluoroscopic device): 701 mGy Kerma COMPLICATIONS: None immediate. PROCEDURE: Following a full explanation of the procedure along with the potential associated complications, an informed witnessed consent was obtained. The patient was placed prone on the fluoroscopic table. The skin overlying the lumbar region was then prepped and draped in the usual sterile fashion. The right pedicle at L2 was then infiltrated with 0.25% bupivacaine followed by the advancement of an 11-gauge Jamshidi needle through the right pedicle into the posterior one-third at L2. This was then exchanged for a Kyphon advanced osteo  introducer system comprised of a working cannula and a Kyphon osteo drill. This combination was then advanced over a Kyphon osteo bone pin until the tip of the Kyphon osteo drill was in the posterior third at L2. At this time, the bone pin was removed. In a medial trajectory, the combination was advanced until the tip of the working cannula was inside the posterior one-third at L2. The osteo drill was removed. Through the working cannula, a Kyphon inflatable bone tamp 20 x 3 was advanced and positioned with the distal marker 5 mm from the anterior aspect of L2. Crossing of the midline was seen on the AP projection. At this time, the balloon was expanded using contrast via a Kyphon inflation syringe device via microtubing. Inflations were continued until there was apposition with the superior endplate. At this time, methylmethacrylate mixture was reconstituted with Tobramycin in the Kyphon bone mixing device system. This was then loaded onto the Kyphon bone fillers. The balloon was deflated and removed followed by the instillation of 7 bone filler equivalents  of methylmethacrylate mixture at L2 with excellent filling in the AP and lateral projections. No extravasation was noted in the disk spaces or posteriorly into the spinal canal. No epidural venous contamination was seen. The working cannula and the bone filler were then retrieved and removed. Hemostasis was achieved at the skin entry site. Patient tolerated the procedure well. IMPRESSION: 1. Status post vertebral body augmentation using balloon kyphoplasty at L2 as described without event. The patient has suffered a fracture of the vertebral body. It is recommended that patients aged 73 years or older be evaluated for possible testing or treatment of osteoporosis. A copy of this procedure report is sent to the patient's referring physician. Electronically Signed   By: Luanne Bras M.D.   On: 12/31/2021 08:43   ECHOCARDIOGRAM COMPLETE  Result Date:  01/06/2022    ECHOCARDIOGRAM REPORT   Patient Name:   OCIE TINO Date of Exam: 01/06/2022 Medical Rec #:  [id number]       Height:       68.0 in Accession #:    [id number]      Weight:       208.3 lb Date of Birth:  Apr 28, 1941       BSA:          2.080 m Patient Age:    81 years        BP:           171/60 mmHg Patient Gender: M               HR:           80 bpm. Exam Location:  Inpatient Procedure: 2D Echo Indications:    Dyspnea  History:        Patient has prior history of Echocardiogram examinations, most                 recent 04/15/2020. Stroke, Signs/Symptoms:Murmur and Shortness of                 Breath; Risk Factors:Dyslipidemia, Hypertension and Diabetes.  Sonographer:    Arlyss Gandy Referring Phys: [id number] [place]  1. Left ventricular ejection fraction, by estimation, is 60 to 65%. The left ventricle has normal function. The left ventricle has no regional wall motion abnormalities. There is mild concentric left ventricular hypertrophy. Left ventricular diastolic parameters are indeterminate.  2. Right ventricular systolic function is low normal. The right ventricular size is normal. There is normal pulmonary artery systolic pressure.  3. Left atrial size was mildly dilated.  4. Right atrial size was mildly dilated.  5. The mitral valve is grossly normal. Trivial mitral valve regurgitation. No evidence of mitral stenosis. Moderate mitral annular calcification.  6. The aortic valve was not well visualized. Aortic valve regurgitation is trivial. Mild aortic valve stenosis.  7. The inferior vena cava is normal in size with <50% respiratory variability, suggesting right atrial pressure of 8 mmHg. Comparison(s): No significant change from prior study. Conclusion(s)/Recommendation(s): Otherwise normal echocardiogram, with minor abnormalities described in the report. FINDINGS  Left Ventricle: Left ventricular ejection fraction, by estimation, is 60 to 65%. The left ventricle has  normal function. The left ventricle has no regional wall motion abnormalities. The left ventricular internal cavity size was normal in size. There is  mild concentric left ventricular hypertrophy. Abnormal (paradoxical) septal motion, consistent with left bundle branch block. Left ventricular diastolic parameters are indeterminate. Right Ventricle: The right ventricular size is normal. Right vetricular wall thickness was not  well visualized. Right ventricular systolic function is low normal. There is normal pulmonary artery systolic pressure. The tricuspid regurgitant velocity is 2.01 m/s, and with an assumed right atrial pressure of 8 mmHg, the estimated right ventricular systolic pressure is 21.1 mmHg. Left Atrium: Left atrial size was mildly dilated. Right Atrium: Right atrial size was mildly dilated. Pericardium: There is no evidence of pericardial effusion. Mitral Valve: The mitral valve is grossly normal. There is mild thickening of the mitral valve leaflet(s). There is mild calcification of the mitral valve leaflet(s). Moderate mitral annular calcification. Trivial mitral valve regurgitation. No evidence of mitral valve stenosis. Tricuspid Valve: The tricuspid valve is not well visualized. Tricuspid valve regurgitation is trivial. No evidence of tricuspid stenosis. Aortic Valve: The aortic valve was not well visualized. Aortic valve regurgitation is trivial. Mild aortic stenosis is present. Aortic valve mean gradient measures 7.0 mmHg. Aortic valve peak gradient measures 13.3 mmHg. Aortic valve area, by VTI measures 2.59 cm. Pulmonic Valve: The pulmonic valve was not well visualized. Pulmonic valve regurgitation is not visualized. Aorta: The aortic root and ascending aorta are structurally normal, with no evidence of dilitation and the aortic arch was not well visualized. Venous: The inferior vena cava is normal in size with less than 50% respiratory variability, suggesting right atrial pressure of 8 mmHg.  IAS/Shunts: The atrial septum is grossly normal. Additional Comments: A device lead is visualized in the right ventricle and right atrium.  LEFT VENTRICLE PLAX 2D LVIDd:         3.70 cm   Diastology LVIDs:         2.60 cm   LV e' medial:    7.29 cm/s LV PW:         1.30 cm   LV E/e' medial:  9.5 LV IVS:        1.30 cm   LV e' lateral:   8.27 cm/s LVOT diam:     2.20 cm   LV E/e' lateral: 8.4 LV SV:         81 LV SV Index:   39 LVOT Area:     3.80 cm  RIGHT VENTRICLE             IVC RV Basal diam:  3.70 cm     IVC diam: 2.00 cm RV Mid diam:    3.00 cm RV S prime:     11.30 cm/s TAPSE (M-mode): 1.6 cm LEFT ATRIUM             Index        RIGHT ATRIUM           Index LA Vol (A2C):   47.4 ml 22.79 ml/m  RA Area:     16.60 cm LA Vol (A4C):   44.7 ml 21.50 ml/m  RA Volume:   42.80 ml  20.58 ml/m LA Biplane Vol: 46.4 ml 22.31 ml/m  AORTIC VALVE AV Area (Vmax):    2.56 cm AV Area (Vmean):   2.42 cm AV Area (VTI):     2.59 cm AV Vmax:           182.50 cm/s AV Vmean:          125.500 cm/s AV VTI:            0.313 m AV Peak Grad:      13.3 mmHg AV Mean Grad:      7.0 mmHg LVOT Vmax:         123.00 cm/s LVOT Vmean:  80.000 cm/s LVOT VTI:          0.213 m LVOT/AV VTI ratio: 0.68  AORTA Ao Asc diam: 3.30 cm MITRAL VALVE                TRICUSPID VALVE MV Area (PHT): 2.01 cm     TR Peak grad:   16.2 mmHg MV Decel Time: 377 msec     TR Vmax:        201.00 cm/s MV E velocity: 69.50 cm/s MV A velocity: 125.00 cm/s  SHUNTS MV E/A ratio:  0.56         Systemic VTI:  0.21 m                             Systemic Diam: 2.20 cm Buford Dresser MD Electronically signed by Buford Dresser MD Signature Date/Time: 01/06/2022/4:33:05 PM    Final    IR Radiologist Eval & Mgmt  Result Date: 12/15/2021 EXAM: NEW PATIENT OFFICE VISIT CHIEF COMPLAINT: Low back pain. Current Pain Level: 1-10 HISTORY OF PRESENT ILLNESS: History obtained from the patient, his spouse, and also the electronic medical records. 81 year old right  handed gentleman referred for evaluation and management of worsening low back pain, seen on the MRI of the lumbosacral spine of November 19, 2021. According to the patient and the spouse the patient's low intensive intermittent chronic low back pain worsened some time and subsequent to ischemic stroke which left him weak in the right arm and leg and also associated with difficulty with balance. Since that time the patient has been having multiple of falls, soft and hard. He reports around July 31, 2021, the patient apparently fell on his buttocks following which he started experiencing severe low back pain. An MRI done on January 19th did show an inferior endplate compression fracture at L2. 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 Patient has been undergoing intensive rehab, but continues to have significant debilitating pain in the low back region especially with prolonged standing and walking. The patient reports this as an 8/10 when severe. He has to sit down for pain relief. Pain relieved some with over the counter Tylenol. He has also been prescribed a lidocaine patch in the lumbar region with modest relief. At the present, the patient reports no acute radiculopathy associated with the pain. Patient ambulates with a walker due to pain and also due to sequela of his stroke. Pain does not interfere with the patient's sleeping pattern. His appetite is normal. He eats regular meals. He denies any symptoms of dysuria, hematuria, or of polyuria. Denies any autonomic dysfunction of bowel or bladder. Denies recent chest pain, shortness of breath, palpitation, coughing, wheezing or hemoptysis. He denies any abdominal pain, difficulty swallowing, constipation, diarrhea or melena. Denies recent chills, fever or rigors. Patient lives with his spouse. He and she feel significant debilitation related to the pain especially  on standing and walking. Diagnosis * : Date . * : Arthritis * : * : R shoulder, bone spur . * : Arthritis * : * : "hands" (03/25/2016) . * : Chronic diastolic heart failure (HCC) * : . * : Chronic lower back pain * : . * : Coronary heart disease * : * : Dr Pernell Dupre . * : H/O cardiovascular stress test * : * : perhaps last one was 2009 . * : Heart murmur * : * :  12/29/11 echo: mild MR, no AS, trivial TR . * : History of gout * : . * : Hyperlipidemia * : . * : Hypertension * : * : saw Dr. Linard Millers last earl;y- 2014, cardiac cath. last done ?2009, blocks seen didn't require any intervention at that point. . * : Narcolepsy * : * : [name] 04-11-97; Mean Latency 1.61mn, SOREM 2 . * : OSA on CPAP * : * : NPSG 12-13-98 AHI 22.7 . * : PONV (postoperative nausea and vomiting) * : . * : Presence of permanent cardiac pacemaker * : . * : Shortness of breath * : * : with exertion . * : Stroke ([place] * : . * : Type II diabetes mellitus ([place] * : PSH: Past Surgical History: Procedure * : Laterality * : Date . * : CARDIAC CATHETERIZATION * : * : 2009? .[name]* : EP IMPLANTABLE DEVICE * : N/A * : 03/25/2016 * : Procedure: Pacemaker Implant - Dual Chamber; Surgeon: GEvans Lance MD; [facility]; Service: Cardiovascular; Laterality: N/A; . * : FRACTURE SURGERY * : * : . * : INSERT / REPLACE / REMOVE PACEMAKER * : * : 03/24/2016 . * : LEFT AND RIGHT HEART CATHETERIZATION WITH CORONARY ANGIOGRAM * : N/A * : 06/05/2014 * : Procedure: LEFT AND RIGHT HEART CATHETERIZATION WITH CORONARY ANGIOGRAM; Surgeon: HSinclair Grooms MD; [facility]; Service: Cardiovascular; Laterality: N/A; . * : NASAL FRACTURE SURGERY * : * : . * : SHOULDER ARTHROSCOPY WITH ROTATOR CUFF REPAIR AND SUBACROMIAL DECOMPRESSION * : Right * : 08/16/2013 * : Procedure: SHOULDER ARTHROSCOPY WITH ROTATOR CUFF REPAIR AND SUBACROMIAL DECOMPRESSION; Surgeon: JNita Sells MD; [place] Service: Orthopedics; Laterality: Right; Right shoulder  arthroscopy rotator cuff repair, subacromial decompression. . * : TONSILLECTOMY * : * : Socioeconomic History . * : Marital status: * : Married * : * : Spouse name: * : Kelly . * : Number of children: * : Not on file . * : Years of education: * : Not on file . * : Highest education level: * : Not on file Occupational History . 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 * : Occupation: * : [employer] Tobacco Use . * : Smoking status: * : Former * : * : Packs/day: * : 2.50 * : * : Years: * : 6.00 * : * : Pack years: * : 15.00 * : * : Types: * : Cigarettes * : * : Quit date: * : 06/05/1967 * : * : Years since quitting: * : 54.5 . * : Smokeless tobacco: * : Never Vaping Use . * : Vaping Use: * : Never used Substance and Sexual Activity . * : Alcohol use: * : Yes * : * : Comment: rare . * : Drug use: * : No . * : Sexual activity: * : Not Currently Other Topics * : Concern . * : Not on file Social History Narrative * : Lives with wife and son * : Right handed * : Drinks 1 cups caffeine daily Food Insecurity: Not on file Transportation Needs: Not on file Physical Activity: Not on file Stress: Not on file Social Connections: Not on file Intimate Partner Violence: Not on file Family History: Family History Problem * : Relation * : Age of Onset . * : Sleep apnea * : Sister * : . * : Colon cancer * : Sister * : Medications:  Current Outpatient Medications on File Prior to Visit Medication * : Sig * : Dispense * : Refill . * : acetaminophen (TYLENOL) 500 MG tablet * : Take 1,000 mg by mouth 2 (two) times daily. * : * : . * : atorvastatin (LIPITOR) 40 MG tablet * : Take 0.5 tablets (20 mg total) by mouth daily. * : 30 tablet * : 0 . * : carbidopa-levodopa (SINEMET) 25-100 MG tablet * : Take 1 tablet by mouth 3 (three) times daily. * : 90 tablet * : 5 . * : Carbidopa-Levodopa ER (SINEMET CR) 25-100 MG tablet controlled release * : Take 1 tablet by mouth at bedtime. * : 30 tablet * : 5 . * : cholecalciferol (VITAMIN D3) 25 MCG  (1000 UNIT) tablet * : Take 1,000 Units by mouth daily. * : * : . * : clonazePAM (KLONOPIN) 0.5 MG tablet * : TAKE 1 TABLET BY MOUTH EVERY DAY AT BEDTIME AS NEEDED FOR SLEEP * : 30 tablet * : 5 . * : clopidogrel (PLAVIX) 75 MG tablet * : Take 1 tablet (75 mg total) by mouth daily. * : 30 tablet * : 0 . * : Coenzyme Q10 (CO Q-10 PO) * : Take 1 capsule by mouth daily. * : * : . * : lidocaine (LIDODERM) 5 % * : Lidoderm 5 % topical patch APPLY 1 PATCH BY TOPICAL ROUTE ONCE DAILY (MAY WEAR UP TO 12 HOURS.) * : * : . * : lisinopril (ZESTRIL) 10 MG tablet * : Take 1 tablet (10 mg total) by mouth daily. (Patient taking differently: Take 20 mg by mouth daily.) * : 30 tablet * : 0 . * : Melatonin 10 MG TABS * : Take 10 mg by mouth at bedtime. * : * : . * : metFORMIN (GLUCOPHAGE) 500 MG tablet * : Take 500 mg by mouth. 4 tables once a day * : * : . * : metoprolol tartrate (LOPRESSOR) 25 MG tablet * : Take 1 tablet (25 mg total) by mouth 2 (two) times daily. * : 60 tablet * : 0 . * : Multiple Vitamin (MULTIVITAMIN WITH MINERALS) TABS tablet * : Take 1 tablet by mouth daily. * : * : . * : naproxen sodium (ALEVE) 220 MG tablet * : Take 220 mg by mouth daily as needed. * : * : . * : NITROSTAT 0.4 MG SL tablet * : PLACE 1 TABLET (0.4 MG TOTAL) UNDER THE TONGUE EVERY 5 (FIVE) MINUTES AS NEEDED FOR CHEST PAIN. * : 25 tablet * : 5 . * : PRESCRIPTION MEDICATION * : See admin instructions. CPAP- At bedtime * : * : . * : sertraline (ZOLOFT) 25 MG tablet * : Take 1 tablet (25 mg total) by mouth daily. * : 30 tablet * : 0 Allergies:  No Known Allergies. REVIEW OF SYSTEMS: Negative unless as mentioned above. PHYSICAL EXAMINATION: Patient sitting comfortably in a wheelchair with prompt responses appropriately. Moderate eye contact. Neurologically speech and comprehension normal without any gross lateralizing abnormalities. Station and gait not tested. Moderate tenderness elicited with palpation in the L2-L3 region. ASSESSMENT AND PLAN:  Findings of the MRI scan were reviewed with the patient, and also with the spouse. 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 Pain has been compounded by the onset of new L2 inferior endplate fracture. Patient does report significant debilitation and interference with lifestyle especially  when walking and with standing. Option of vertebral body augmentation with inter vertebroplasty or kyphoplasty reviewed with both him and the spouse. The procedure would allow alleviation of pain emanating from the L2 fracture. However, fracture related to the degenerative changes in the lumbosacral spine described above would probably not be impacted. The procedure, the risks and alternatives were reviewed. Procedure would be an outpatient procedure with the patient having to be monitored in short-stay for 3 hours post procedure. The patient and spouse would like to discuss further prior to finalizing their decision. In the meantime, they were asked to call should they have any concerns or questions. Electronically Signed   By: Luanne Bras M.D.   On: 12/15/2021 08:35    Labs: BNP (last 3 results) No results for input(s): BNP in the last 8760 hours. Basic Metabolic Panel: Recent Labs  Lab 01/02/22 0511 01/03/22 0555 01/04/22 0455 01/05/22 0517 01/06/22 0527 01/07/22 0502  NA 133* 134* 137 134* 137 137  K 3.2* 4.1 3.5 3.8 3.7 3.6  CL 103 103 105 103 102 104  CO2 '23 23 23 23 25 25  '$ GLUCOSE 113* 141* 152* 146* 147* 162*  BUN '19 17 15 17 18 21  '$ CREATININE 0.76 0.81 0.76 0.68 0.76 0.79  CALCIUM 8.0* 8.4* 8.6* 8.5* 9.1 9.0  MG 1.8 1.9 1.6*  --  1.7  --    Liver Function Tests: Recent Labs  Lab 01/07/22 0502  AST 21  ALT 9  ALKPHOS 77  BILITOT 0.3  PROT 6.5  ALBUMIN 2.9*   No results for input(s): LIPASE, AMYLASE in the last 168 hours. No results for input(s): AMMONIA in the last 168 hours. CBC: Recent Labs   Lab 01/02/22 0511 01/04/22 0455 01/06/22 0527 01/07/22 0502  WBC 14.3* 13.9* 14.1* 13.8*  NEUTROABS 11.2*  --   --   --   HGB 11.7* 11.9* 13.5 13.0  HCT 35.6* 36.0* 41.0 38.4*  MCV 91.5 89.8 89.5 89.1  PLT 192 247 306 330   Cardiac Enzymes: No results for input(s): CKTOTAL, CKMB, CKMBINDEX, TROPONINI in the last 168 hours. BNP: Invalid input(s): POCBNP CBG: Recent Labs  Lab 01/07/22 0808 01/07/22 1216 01/07/22 1732 01/07/22 2143 01/08/22 0735  GLUCAP 201* 238* 167* 255* 166*   D-Dimer No results for input(s): DDIMER in the last 72 hours. Hgb A1c No results for input(s): HGBA1C in the last 72 hours. Lipid Profile No results for input(s): CHOL, HDL, LDLCALC, TRIG, CHOLHDL, LDLDIRECT in the last 72 hours. Thyroid function studies No results for input(s): TSH, T4TOTAL, T3FREE, THYROIDAB in the last 72 hours.  Invalid input(s): FREET3 Anemia work up No results for input(s): VITAMINB12, FOLATE, FERRITIN, TIBC, IRON, RETICCTPCT in the last 72 hours. Urinalysis    Component Value Date/Time   COLORURINE YELLOW 12/30/2021 1053   APPEARANCEUR CLEAR 12/30/2021 1053   LABSPEC 1.026 12/30/2021 1053   PHURINE 5.0 12/30/2021 1053   GLUCOSEU NEGATIVE 12/30/2021 1053   HGBUR LARGE (A) 12/30/2021 1053   BILIRUBINUR NEGATIVE 12/30/2021 1053   KETONESUR 5 (A) 12/30/2021 1053   PROTEINUR 30 (A) 12/30/2021 1053   NITRITE NEGATIVE 12/30/2021 1053   LEUKOCYTESUR NEGATIVE 12/30/2021 1053   Sepsis Labs Invalid input(s): PROCALCITONIN,  WBC,  LACTICIDVEN Microbiology Recent Results (from the past 240 hour(s))  Culture, blood (Routine X 2) w Reflex to ID Panel     Status: None   Collection Time: 12/30/21 11:18 AM   Specimen: BLOOD  Result Value Ref Range Status   Specimen Description  Final    BLOOD RIGHT ANTECUBITAL Performed at [facility] [address] [place], [place] [address]    Special Requests   Final    BOTTLES DRAWN AEROBIC AND ANAEROBIC Blood  Culture results may not be optimal due to an excessive volume of blood received in culture bottles Performed at [facility] [address] [place], [place] [address]    Culture   Final    NO GROWTH 5 DAYS Performed at [facility], [place] [address], [place], [place] [address]    Report Status 01/04/2022 FINAL  Final  Culture, blood (Routine X 2) w Reflex to ID Panel     Status: None   Collection Time: 12/30/21 11:23 AM   Specimen: BLOOD  Result Value Ref Range Status   Specimen Description   Final    BLOOD LEFT ANTECUBITAL Performed at [facility] [address] [place], [place] [address]    Special Requests   Final    BOTTLES DRAWN AEROBIC AND ANAEROBIC Blood Culture results may not be optimal due to an excessive volume of blood received in culture bottles Performed at [facility] [address], [place], [place] [address]    Culture   Final    NO GROWTH 5 DAYS Performed at [facility], [place] [address], [place],  [address]    Report Status 01/04/2022 FINAL  Final  Resp Panel by RT-PCR (Flu A&B, Covid) Nasopharyngeal Swab     Status: None   Collection Time: 12/30/21 11:24 AM   Specimen: Nasopharyngeal Swab; Nasopharyngeal(NP) swabs in vial transport medium  Result Value Ref Range Status   SARS Coronavirus 2 by RT PCR NEGATIVE NEGATIVE Final    Comment: (NOTE) SARS-CoV-2 target nucleic acids are NOT DETECTED.  The SARS-CoV-2 RNA is generally detectable in upper respiratory specimens during the acute phase of infection. The lowest concentration of SARS-CoV-2 viral copies this assay can detect is 138 copies/mL. A negative result does not preclude SARS-Cov-2 infection and should not be used as the sole basis for treatment or other patient management decisions. A negative result may occur with  improper specimen collection/handling, submission of specimen other than nasopharyngeal swab, presence of  viral mutation(s) within the areas targeted by this assay, and inadequate number of viral copies(<138 copies/mL). A negative result must be combined with clinical observations, patient history, and epidemiological information. The expected result is Negative.  Fact Sheet for Patients:  EntrepreneurPulse.com.au  Fact Sheet for Healthcare Providers:  IncredibleEmployment.be  This test is no t yet approved or cleared by the [place] FDA and  has been authorized for detection and/or diagnosis of SARS-CoV-2 by FDA under an Emergency Use Authorization (EUA). This EUA will remain  in effect (meaning this test can be used) for the duration of the COVID-19 declaration under Section 564(b)(1) of the Act, 21 U.S.C.section 360bbb-3(b)(1), unless the authorization is terminated  or revoked sooner.       Influenza A by PCR NEGATIVE NEGATIVE Final   Influenza B by PCR NEGATIVE NEGATIVE Final    Comment: (NOTE) The Xpert Xpress SARS-CoV-2/FLU/RSV plus assay is intended as an aid in the diagnosis of influenza from Nasopharyngeal swab specimens and should not be used as a sole basis for treatment. Nasal washings and aspirates are unacceptable for Xpert Xpress SARS-CoV-2/FLU/RSV testing.  Fact Sheet for Patients: EntrepreneurPulse.com.au  Fact Sheet for Healthcare Providers: IncredibleEmployment.be  This test is not yet approved or cleared by the  [place] Architectural technologist and has been authorized for detection and/or diagnosis of SARS-CoV-2 by FDA under an Print production planner (EUA). This EUA will remain in effect (meaning this test can be used) for the duration of the COVID-19 declaration under Section 564(b)(1) of the Act, 21 U.S.C. section 360bbb-3(b)(1), unless the authorization is terminated or revoked.  Performed at [facility], [place] [address], [place], [place] [address]   MRSA Next Gen by  PCR, Nasal     Status: None   Collection Time: 12/30/21  3:30 PM   Specimen: Nasal Mucosa; Nasal Swab  Result Value Ref Range Status   MRSA by PCR Next Gen NOT DETECTED NOT DETECTED Final    Comment: (NOTE) The GeneXpert MRSA Assay (FDA approved for NASAL specimens only), is one component of a comprehensive MRSA colonization surveillance program. It is not intended to diagnose MRSA infection nor to guide or monitor treatment for MRSA infections. Test performance is not FDA approved in patients less than 31 years old. Performed at [facility], [place] [address], [place], [place] [address]   Urine Culture     Status: None   Collection Time: 12/31/21  6:45 PM   Specimen: Urine, Clean Catch  Result Value Ref Range Status   Specimen Description   Final    URINE, CLEAN CATCH Performed at [facility], [place] [address] [place], [place] [address]    Special Requests   Final    NONE Performed at [facility], [place] [address], [place], [place] [address]    Culture   Final    NO GROWTH Performed at [facility], [place] [address], [place], [place] [address]    Report Status 01/02/2022 FINAL  Final     Time coordinating discharge: 35 minutes  SIGNED: Antonieta Pert, MD  [facility] 01/08/2022, 11:45 AM  If 7PM-7AM, please contact night-coverage [email or web address]

## 2022-01-09 LAB — CBC WITH DIFFERENTIAL/PLATELET
Abs Immature Granulocytes: 0.09 10*3/uL — ABNORMAL HIGH (ref 0.00–0.07)
Basophils Absolute: 0.1 10*3/uL (ref 0.0–0.1)
Basophils Relative: 0 %
Eosinophils Absolute: 0.5 10*3/uL (ref 0.0–0.5)
Eosinophils Relative: 4 %
HCT: 36.2 % — ABNORMAL LOW (ref 39.0–52.0)
Hemoglobin: 12 g/dL — ABNORMAL LOW (ref 13.0–17.0)
Immature Granulocytes: 1 %
Lymphocytes Relative: 19 %
Lymphs Abs: 2.5 10*3/uL (ref 0.7–4.0)
MCH: 29.7 pg (ref 26.0–34.0)
MCHC: 33.1 g/dL (ref 30.0–36.0)
MCV: 89.6 fL (ref 80.0–100.0)
Monocytes Absolute: 0.8 10*3/uL (ref 0.1–1.0)
Monocytes Relative: 6 %
Neutro Abs: 9.1 10*3/uL — ABNORMAL HIGH (ref 1.7–7.7)
Neutrophils Relative %: 70 %
Platelets: 384 10*3/uL (ref 150–400)
RBC: 4.04 MIL/uL — ABNORMAL LOW (ref 4.22–5.81)
RDW: 12.9 % (ref 11.5–15.5)
WBC: 13.1 10*3/uL — ABNORMAL HIGH (ref 4.0–10.5)
nRBC: 0 % (ref 0.0–0.2)

## 2022-01-09 LAB — COMPREHENSIVE METABOLIC PANEL
ALT: 12 U/L (ref 0–44)
AST: 22 U/L (ref 15–41)
Albumin: 2.5 g/dL — ABNORMAL LOW (ref 3.5–5.0)
Alkaline Phosphatase: 75 U/L (ref 38–126)
Anion gap: 12 (ref 5–15)
BUN: 25 mg/dL — ABNORMAL HIGH (ref 8–23)
CO2: 22 mmol/L (ref 22–32)
Calcium: 9 mg/dL (ref 8.9–10.3)
Chloride: 102 mmol/L (ref 98–111)
Creatinine, Ser: 0.78 mg/dL (ref 0.61–1.24)
GFR, Estimated: >60 mL/min (ref >60)
Glucose, Bld: 202 mg/dL — ABNORMAL HIGH (ref 70–99)
Potassium: 3.9 mmol/L (ref 3.5–5.1)
Sodium: 136 mmol/L (ref 135–145)
Total Bilirubin: 0.5 mg/dL (ref 0.3–1.2)
Total Protein: 6.2 g/dL — ABNORMAL LOW (ref 6.5–8.1)

## 2022-01-09 LAB — GLUCOSE, CAPILLARY
Glucose-Capillary: 213 mg/dL — ABNORMAL HIGH (ref 70–99)
Glucose-Capillary: 218 mg/dL — ABNORMAL HIGH (ref 70–99)
Glucose-Capillary: 195 mg/dL — ABNORMAL HIGH (ref 70–99)
Glucose-Capillary: 217 mg/dL — ABNORMAL HIGH (ref 70–99)

## 2022-01-09 NOTE — Evaluation (Signed)
Occupational Therapy Assessment and Plan ° °Patient Details  °Name: Jordan [name] °MRN: [id number] °Date of Birth: 05/04/1941 ° °OT Diagnosis: abnormal posture, cognitive deficits, and muscle weakness (generalized) °Rehab Potential: Rehab Potential (ACUTE ONLY): Good °ELOS: 8-10 days  ° °Today's Date: 01/09/2022 °OT Individual Time: 1415-1530 °OT Individual Time Calculation (min): 75 min    ° °Hospital Problem: Principal Problem: °  Debility ° ° °Past Medical History:  °Past Medical History:  °Diagnosis Date  ° Arthritis   ° R shoulder, bone spur  ° Arthritis   ° "hands" (03/25/2016)  ° Chronic diastolic heart failure (HCC)   ° Chronic lower back pain   ° Coronary heart disease   ° Dr Hank Smith  ° H/O cardiovascular stress test   ° perhaps last one was 2009  ° Heart murmur   ° 12/29/11 echo: mild MR, no AS, trivial TR  ° History of gout   ° Hyperlipidemia   ° Hypertension   ° saw Dr. H. Smith last earl;y- 2014, cardiac cath. last done ?2009, blocks seen didn't require any intervention at that point.  ° Narcolepsy   ° [name] 04-11-97; Mean Latency 1.1min, SOREM 2  ° OSA on CPAP   ° NPSG 12-13-98 AHI 22.7  ° PONV (postoperative nausea and vomiting)   ° Presence of permanent cardiac pacemaker   ° Shortness of breath   ° with exertion   ° Stroke (HCC)   ° Type II diabetes mellitus (HCC)   ° °Past Surgical History:  °Past Surgical History:  °Procedure Laterality Date  ° CARDIAC CATHETERIZATION  2009?  ° EP IMPLANTABLE DEVICE N/A 03/25/2016  ° Procedure: Pacemaker Implant - Dual Chamber;  Surgeon: Gregg W Taylor, MD;  Location: [facility] INVASIVE CV LAB;  Service: Cardiovascular;  Laterality: N/A;  ° FRACTURE SURGERY    ° INSERT / REPLACE / REMOVE PACEMAKER  03/24/2016  ° IR KYPHO LUMBAR INC FX REDUCE BONE BX UNI/BIL CANNULATION INC/IMAGING  12/29/2021  ° IR RADIOLOGIST EVAL & MGMT  12/15/2021  ° LEFT AND RIGHT HEART CATHETERIZATION WITH CORONARY ANGIOGRAM N/A 06/05/2014  ° Procedure: LEFT AND RIGHT HEART CATHETERIZATION WITH CORONARY  ANGIOGRAM;  Surgeon: Henry W Smith III, MD;  Location: [facility] CATH LAB;  Service: Cardiovascular;  Laterality: N/A;  ° NASAL FRACTURE SURGERY    ° SHOULDER ARTHROSCOPY WITH ROTATOR CUFF REPAIR AND SUBACROMIAL DECOMPRESSION Right 08/16/2013  ° Procedure: SHOULDER ARTHROSCOPY WITH ROTATOR CUFF REPAIR AND SUBACROMIAL DECOMPRESSION;  Surgeon: Justin William Chandler, MD;  Location: [facility] OR;  Service: Orthopedics;  Laterality: Right;  Right shoulder arthroscopy rotator cuff repair, subacromial decompression.  ° TONSILLECTOMY    ° ° °Assessment & Plan °Clinical Impression:   Jordan [name] is an 81 year old male with history of CAD/chronic diastolic CHF, OSA, T2DM, gout, Parkinson's diease with recent admission 02/17-02-22-23 worsening of gait disorder with falls with hallucinations which was treated with medication adjustment and he was discharged to home. He was readmitted on 12/30/21 with abdominal pain with low grade fever, leucocytosis, lactic acidosis, mild confusion and lethargy secondary to sepsis. He was found to have elevated lipase-165  with inflammatory changes suggestive of primary pancreatitis with secondary inflammation of hepatic flexure of colon or primary sigmoid diverticulitis with secondary extension to pancreatic tail. He was treated with IVF and broad spectrum antibiotics.  He was found to have oropharyngeal dysphagia exacerbated by fluctuating mentation and diet has been adjusted depending on alertness.   °  °Abdominal pain resolved with improvement in agitation and   downward trend in leucocytosis therefore antibiotics narrowed to Augmentin. MBS done 03/06 and he was noted to have mild to moderate oral and mild pharyngeal dysphagia-->on Regular, thins with aspiration precautions.  He did develop volume overload with respiratory symptoms on 03/05 requiring IV diuresis. 2D echo done revealing EF 60-65% with mild concentric LVH and trivial MVR. Po intake remains poor requiring multiple supplements.  Metformin resumed due to upward trend in BS. Therapy ongoing and patient continues to be limited by disorientation with delay in processing, weakness, poor awareness of deficits and decreased activity tolerance. CIR recommended due to functional decline. Complains of cough.     Patient transferred to CIR on 01/08/2022 .    Patient currently requires mod with basic self-care skills secondary to muscle joint tightness, decreased cardiorespiratoy endurance, decreased coordination, decreased memory, and decreased sitting balance, decreased standing balance, decreased postural control, and decreased balance strategies.  Prior to hospitalization, patient could complete BADLs with min to supervision.   Patient will benefit from skilled intervention to increase independence with basic self-care skills prior to discharge home with care partner.  Anticipate patient will require intermittent supervision and follow up outpatient.  OT - End of Session Activity Tolerance: Tolerates 10 - 20 min activity with multiple rests Endurance Deficit: Yes Endurance Deficit Description: 2/2 PD OT Assessment Rehab Potential (ACUTE ONLY): Good OT Patient demonstrates impairments in the following area(s): Balance;Cognition;Endurance;Sensory;Motor;Vision OT Basic ADL's Functional Problem(s): Bathing;Dressing;Toileting OT Transfers Functional Problem(s): Toilet;Tub/Shower OT Additional Impairment(s): None OT Plan OT Intensity: Minimum of 1-2 x/day, 45 to 90 minutes OT Frequency: 5 out of 7 days OT Duration/Estimated Length of Stay: 8-10 days OT Treatment/Interventions: Balance/vestibular training;Discharge planning;Cognitive remediation/compensation;DME/adaptive equipment instruction;Functional mobility training;Psychosocial support;Therapeutic Activities;UE/LE Strength taining/ROM;UE/LE Coordination activities;Therapeutic Exercise;Self Care/advanced ADL retraining;Patient/family education;Neuromuscular re-education OT Self  Feeding Anticipated Outcome(s): independent OT Basic Self-Care Anticipated Outcome(s): supervision with dressing and bathing OT Toileting Anticipated Outcome(s): supervision OT Bathroom Transfers Anticipated Outcome(s): supervision to toilet, MIN to shower OT Recommendation Patient destination: Home Follow Up Recommendations: Outpatient OT Equipment Recommended: To be determined Equipment Details: pt has toilet rails, elevated seat and shower chair   OT Evaluation Precautions/Restrictions  Precautions Precautions: Fall;Back Precaution Comments: L2 kyphoplasty 12/29/21 Restrictions Weight Bearing Restrictions: No    Vital Signs Therapy Vitals Temp: 98.7 F (37.1 C) Temp Source: Oral Pulse Rate: 65 Resp: 17 BP: 101/69 Patient Position (if appropriate): Sitting Oxygen Therapy SpO2: 93 % O2 Device: Room Air Pain Pain Assessment Pain Scale: 0-10 Pain Score: 0-No pain Home Living/Prior Functioning Home Living Family/patient expects to be discharged to:: Private residence Living Arrangements: Spouse/significant other, Children Available Help at Discharge: Family Type of Home: Apartment Home Access: Elevator Home Layout: One level Bathroom Shower/Tub: Public librarian, Multimedia programmer: Handicapped height  Lives With: Spouse IADL History Education: college graduate Prior Function Level of Independence: Needs assistance with ADLs, Needs assistance with gait Dressing: Moderate Driving: No Vocation: Retired Surveyor, mining Baseline Vision/History: 1 Wears glasses Ability to See in Adequate Light: 0 Adequate Vision Assessment?: No apparent visual deficits Additional Comments: pt does report diplopia but no other deficits Perception  Perception: Within Functional Limits Praxis Praxis: Intact Cognition Overall Cognitive Status: No family/caregiver present to determine baseline cognitive functioning Arousal/Alertness: Awake/alert Orientation Level:  Person;Place;Situation Person: Oriented Place: Oriented Situation: Oriented Year: 2023 Month: March Day of Week: Correct Memory: Impaired Memory Impairment: Storage deficit;Other (comment) (recalled 2 of 5 words after 2 minute delay) Immediate Memory Recall: Sock;Blue;Bed Memory Recall Sock: Without Cue Memory Recall Blue: Without Cue  Memory Recall Bed: Without Cue Attention: Sustained Sustained Attention: Appears intact Awareness: Appears intact Problem Solving: Impaired Problem Solving Impairment: Verbal complex Safety/Judgment: Appears intact Sensation Sensation Light Touch: Impaired by gross assessment (pt reports that he can not feel the bottom of his feet) Hot/Cold: Appears Intact Proprioception: Not tested Stereognosis: Not tested Coordination Gross Motor Movements are Fluid and Coordinated: No Fine Motor Movements are Fluid and Coordinated: No Coordination and Movement Description: decreased [place] on LE due to hip stiffness and Parkinson's symptoms. Finger Nose Finger Test: delayed but accurate on B sides Motor  Motor Motor: Other (comment) Motor - Skilled Clinical Observations: bradykinesia  Trunk/Postural Assessment  Postural Control Postural Control: Deficits on evaluation (forward flexion in standing and with ambulation)  Balance Static Sitting Balance Static Sitting - Level of Assistance: 7: Independent Dynamic Sitting Balance Dynamic Sitting - Level of Assistance: 5: Stand by assistance (limited by back precautions) Static Standing Balance Static Standing - Level of Assistance: 4: Min assist Dynamic Standing Balance Dynamic Standing - Level of Assistance: 2: Max assist Extremity/Trunk Assessment RUE Assessment Active Range of Motion (AROM) Comments: sh flexion to 160 (arthritis in shoulders) General Strength Comments: WFL LUE Assessment Active Range of Motion (AROM) Comments: sh flexion to 160 (arthritis in shoulders) General Strength Comments:  [facility]  Care Tool Care Tool Self Care Eating   Eating Assist Level: Supervision/Verbal cueing    Oral Care    Oral Care Assist Level: Supervision/Verbal cueing    Bathing   Body parts bathed by patient: Right arm;Left arm;Chest;Abdomen;Right upper leg;Left upper leg;Face;Front perineal area Body parts bathed by helper: Buttocks;Right lower leg;Left lower leg   Assist Level: Moderate Assistance - Patient 50 - 74%    Upper Body Dressing(including orthotics)   What is the patient wearing?: Pull over shirt   Assist Level: Set up assist    Lower Body Dressing (excluding footwear)   What is the patient wearing?: Incontinence brief;Pants Assist for lower body dressing: Moderate Assistance - Patient 50 - 74%    Putting on/Taking off footwear   What is the patient wearing?: Non-skid slipper socks Assist for footwear: Dependent - Patient 0%       Care Tool Toileting Toileting activity   Assist for toileting: Total Assistance - Patient < 25%     Care Tool Bed Mobility Roll left and right activity        Sit to lying activity        Lying to sitting on side of bed activity         Care Tool Transfers Sit to stand transfer        Chair/bed transfer         Toilet transfer         Care Tool Cognition  Expression of Ideas and Wants Expression of Ideas and Wants: 4. Without difficulty (complex and basic) - expresses complex messages without difficulty and with speech that is clear and easy to understand  Understanding Verbal and Non-Verbal Content Understanding Verbal and Non-Verbal Content: 4. Understands (complex and basic) - clear comprehension without cues or repetitions   Memory/Recall Ability Memory/Recall Ability : Current season;Location of own room;That he or she is in a hospital/hospital unit   Refer to Care Plan for [place] [facility] OT Short Term Goal 1 (Week 1): STGs = LTGs  Recommendations for other services: None    Skilled  Therapeutic Intervention ADL ADL Eating: Set up Grooming: Setup Upper Body Bathing: Setup  Lower Body Bathing: Moderate assistance °Upper Body Dressing: Supervision/safety °Lower Body Dressing: Maximal assistance °Toileting: Maximal assistance °Toilet Transfer: Minimal assistance °Toilet Transfer Method: Stand pivot °Toilet Transfer Equipment: Raised toilet seat;Grab bars ° °Pt seen for initial evaluation and ADL training with a focus on safe mobility skills, introduction of AE of a reacher and sock aid for his back precautions, balance and bathroom transfers.  See ADL documentation above. Explained role of OT, ELOS, goals, and POC.  Pt agreeable to all and wants to be as independent as possible.  Pt resting in recliner at end of session with all needs met and chair alarm on.  ° °Discharge Criteria: Patient will be discharged from OT if patient refuses treatment 3 consecutive times without medical reason, if treatment goals not met, if there is a change in medical status, if patient makes no progress towards goals or if patient is discharged from hospital. ° °The above assessment, treatment plan, treatment alternatives and goals were discussed and mutually agreed upon: by patient ° °, °01/09/2022, 4:35 PM  °

## 2022-01-09 NOTE — Evaluation (Signed)
Physical Therapy Assessment and Plan  Patient Details  Name: Jordan [name] MRN: [id number] Date of Birth: 04/25/1941  PT Diagnosis: Abnormality of gait, Difficulty walking, and Muscle weakness Rehab Potential: Good ELOS: 8-10 days   Today's Date: 01/09/2022 PT Individual Time: 1000-1111 PT Individual Time Calculation (min): 71 min    Hospital Problem: Principal Problem:   Debility   Past Medical History:  Past Medical History:  Diagnosis Date   Arthritis    R shoulder, bone spur   Arthritis    "hands" (03/25/2016)   Chronic diastolic heart failure (HCC)    Chronic lower back pain    Coronary heart disease    Dr Pernell Dupre   H/O cardiovascular stress test    perhaps last one was 2009   Heart murmur    12/29/11 echo: mild MR, no AS, trivial TR   History of gout    Hyperlipidemia    Hypertension    saw Dr. Linard Millers last earl;y- 2014, cardiac cath. last done ?2009, blocks seen didn't require any intervention at that point.   Narcolepsy    [name] 04-11-97; Mean Latency 1.22mn, SOREM 2   OSA on CPAP    NPSG 12-13-98 AHI 22.7   PONV (postoperative nausea and vomiting)    Presence of permanent cardiac pacemaker    Shortness of breath    with exertion    [place]    Type II diabetes mellitus ([facility]    Past Surgical History:  Past Surgical History:  Procedure Laterality Date   CARDIAC CATHETERIZATION  2009?   EP IMPLANTABLE DEVICE N/A 03/25/2016   Procedure: Pacemaker Implant - Dual Chamber;  Surgeon: GEvans Lance MD;  [facility];  Service: Cardiovascular;  Laterality: N/A;   FRACTURE SURGERY     INSERT / REPLACE / REMOVE PACEMAKER  03/24/2016   IR KYPHO LUMBAR INC FX REDUCE BONE BX UNI/BIL CANNULATION INC/IMAGING  12/29/2021   IR RADIOLOGIST EVAL & MGMT  12/15/2021   LEFT AND RIGHT HEART CATHETERIZATION WITH CORONARY ANGIOGRAM N/A 06/05/2014   Procedure: LEFT AND RIGHT HEART CATHETERIZATION WITH CORONARY ANGIOGRAM;  Surgeon: HSinclair Grooms MD;   [facility];  Service: Cardiovascular;  Laterality: N/A;   NASAL FRACTURE SURGERY     SHOULDER ARTHROSCOPY WITH ROTATOR CUFF REPAIR AND SUBACROMIAL DECOMPRESSION Right 08/16/2013   Procedure: SHOULDER ARTHROSCOPY WITH ROTATOR CUFF REPAIR AND SUBACROMIAL DECOMPRESSION;  Surgeon: JNita Sells MD;  [facility]  Service: Orthopedics;  Laterality: Right;  Right shoulder arthroscopy rotator cuff repair, subacromial decompression.   TONSILLECTOMY      Assessment & Plan Clinical Impression: Patient is a 81year old male with history of CAD/chronic diastolic CHF, OSA, TV4QV gout, Parkinson's diease with recent admission 02/17-02-22-23 worsening of gait disorder with falls with hallucinations which was treated with medication adjustment and he was discharged to home. He was readmitted on 12/30/21 with abdominal pain with low grade fever, leucocytosis, lactic acidosis, mild confusion and lethargy secondary to sepsis. He was found to have elevated lipase-165  with inflammatory changes suggestive of primary pancreatitis with secondary inflammation of hepatic flexure of colon or primary sigmoid diverticulitis with secondary extension to pancreatic tail. He was treated with IVF and broad spectrum antibiotics.  He was found to have oropharyngeal dysphagia exacerbated by fluctuating mentation and diet has been adjusted depending on alertness.     Abdominal pain resolved with improvement in agitation and downward trend in leucocytosis therefore antibiotics narrowed to Augmentin.  MBS done 03/06 and he was noted to have mild to moderate oral and mild pharyngeal dysphagia-->on Regular, thins with aspiration precautions.  He did develop volume overload with respiratory symptoms on 03/05 requiring IV diuresis. 2D echo done revealing EF 60-65% with mild concentric LVH and trivial MVR. Po intake remains poor requiring multiple supplements. Metformin resumed due to upward trend in BS. Therapy ongoing and  patient continues to be limited by disorientation with delay in processing, weakness, poor awareness of deficits and decreased activity tolerance. CIR recommended due to functional decline. Complains of cough.  Patient currently requires min with mobility secondary to muscle weakness and decreased motor planning.  Prior to hospitalization, patient was supervision with mobility and lived with Spouse in a [place] home.  Home access is  Elevator.  Patient will benefit from skilled PT intervention to maximize safe functional mobility, minimize fall risk, and decrease caregiver burden for planned discharge home with 24 hour supervision.  Anticipate patient will benefit from follow up [place] at discharge.  PT - End of Session Activity Tolerance: Tolerates 30+ min activity with multiple rests Endurance Deficit: Yes Endurance Deficit Description: 2/2 PD PT Assessment Rehab Potential (ACUTE/IP ONLY): Good PT Patient demonstrates impairments in the following area(s): Balance;Endurance;Motor PT Transfers Functional Problem(s): Bed to Chair;Car PT Locomotion Functional Problem(s): Ambulation PT Plan PT Intensity: Minimum of 1-2 x/day ,45 to 90 minutes PT Frequency: 5 out of 7 days PT Duration Estimated Length of Stay: 8-10 days PT Treatment/Interventions: Ambulation/gait training;Cognitive remediation/compensation;Discharge planning;Balance/vestibular training;Disease management/prevention;Functional mobility training;Neuromuscular re-education;Patient/family education;Stair training;Therapeutic Activities;Therapeutic Exercise;UE/LE Coordination activities PT Transfers Anticipated Outcome(s): Supervision PT Locomotion Anticipated Outcome(s): Supervision PT Recommendation Recommendations for Other Services: Speech consult Follow Up Recommendations: Home health PT Patient destination: Home Equipment Recommended: To be determined   PT Evaluation Precautions/Restrictions   General   Vital SignsTherapy  Vitals Temp: 98.7 F (37.1 C) Temp Source: Oral Pulse Rate: 65 Resp: 17 BP: 101/69 Patient Position (if appropriate): Sitting Oxygen Therapy SpO2: 93 % O2 Device: Room Air Pain Pain Assessment Pain Scale: 0-10 Pain Score: 0-No pain Pain Interference   Home Living/Prior Functioning Home Living Available Help at Discharge: Family Type of Home: Apartment  Lives With: Spouse Prior Function Vocation: Retired Vision/Perception     Cognition Overall Cognitive Status: No family/caregiver present to determine baseline cognitive functioning Arousal/Alertness: Awake/alert Orientation Level: Oriented X4 Year: 2023 Month: March Day of Week: Correct Attention: Sustained Sustained Attention: Appears intact Memory: Impaired Memory Impairment: Storage deficit;Other (comment) (recalled 2 of 5 words after 2 minute delay) Awareness: Appears intact Problem Solving: Impaired Problem Solving Impairment: Verbal complex Safety/Judgment: Appears intact Sensation   Motor  Motor Motor: Other (comment) Motor - Skilled Clinical Observations: bradykinesia     RLE Assessment RLE Assessment: Within Functional Limits LLE Assessment LLE Assessment: Within Functional Limits  Care Tool Care Tool Bed Mobility Roll left and right activity   Roll left and right assist level: Supervision/Verbal cueing    Sit to lying activity   Sit to lying assist level: Contact Guard/Touching assist    Lying to sitting on side of bed activity   Lying to sitting on side of bed assist level: the ability to move from lying on the back to sitting on the side of the bed with no back support.: Contact Guard/Touching assist     Care Tool Transfers Sit to stand transfer   Sit to stand assist level: Minimal Assistance - Patient > 75%    Chair/bed transfer   Chair/bed transfer assist level:  Minimal Assistance - Patient > 75%     Toilet transfer   Assist Level: Minimal Assistance - Patient > 75%    Car  transfer          Care Tool Locomotion Ambulation          Walk 10 feet activity         Walk 50 feet with 2 turns activity        Walk 150 feet activity        Walk 10 feet on uneven surfaces activity        Stairs          Walk up/down 1 step activity        Walk up/down 4 steps activity        Walk up/down 12 steps activity        Pick up small objects from floor        Wheelchair            Wheel 50 feet with 2 turns activity      Wheel 150 feet activity        Refer to Care Plan for Long Term Goals  SHORT TERM GOAL WEEK 1 PT Short Term Goal 1 (Week 1): STG = LTG 2/2 LOS  Recommendations for other services: None   Skilled Therapeutic Intervention Mobility Bed Mobility Bed Mobility: Rolling Right;Right Sidelying to Sit Rolling Right: Set up assist;Supervision/verbal cueing Right Sidelying to Sit: Contact Guard/Touching assist Transfers Transfers: Sit to Stand;Stand to Sit;Stand Pivot Transfers Sit to Stand: Minimal Assistance - Patient > 75% Stand to Sit: Minimal Assistance - Patient > 75% Stand Pivot Transfers: Minimal Assistance - Patient > 75% Stand Pivot Transfer Details: Verbal cues for precautions/safety Transfer (Assistive device): Rolling walker Locomotion  Gait Ambulation: No  Session Note: Chart reviewed and pt agreeable to therapy. Pt received semi-reclined in bed with no c/o pain. Also of note, wife was present t/o session to assist eval. Session focused on evaluation, WC management, and functional transfers. Pt initiated session with completion of eval. Pt noted to not have WC in room, so PT obtained WC for room and reviewed WC with pt and wife. Pt then completed transfer to EOB with CGA+ RW braced by assistant to aid transfer.  At EOB pt practiced sit<>stand with MinA. Pt then proceeded to transfer to chair with SSTT using MinA + RW. Due to length of eval and pt WC management, remaining session time focused on education of  care plan and answering questions for DME and mobility safety.  Pt was updated on and agreed to continued care plan. At end of session, pt was left seated in recliner with alarm engaged, nurse call bell and all needs in reach.   Discharge Criteria: Patient will be discharged from PT if patient refuses treatment 3 consecutive times without medical reason, if treatment goals not met, if there is a change in medical status, if patient makes no progress towards goals or if patient is discharged from hospital.  The above assessment, treatment plan, treatment alternatives and goals were discussed and mutually agreed upon: by patient and by family  Marquette Saa, PT, DPT 01/09/2022, 4:14 PM

## 2022-01-09 NOTE — Evaluation (Signed)
Speech Language Pathology Assessment and Plan  Patient Details  Name: Jordan [name] MRN: [id number] Date of Birth: 01/14/1941  SLP Diagnosis: Cognitive Impairments;Dysphagia  Rehab Potential: Good ELOS: 8-10 days    Today's Date: 01/09/2022 SLP Individual Time: 1300-1400 SLP Individual Time Calculation (min): 60 min   Hospital Problem: Principal Problem:   Debility  Past Medical History:  Past Medical History:  Diagnosis Date   Arthritis    R shoulder, bone spur   Arthritis    "hands" (03/25/2016)   Chronic diastolic heart failure (HCC)    Chronic lower back pain    Coronary heart disease    Dr Pernell Dupre   H/O cardiovascular stress test    perhaps last one was 2009   Heart murmur    12/29/11 echo: mild MR, no AS, trivial TR   History of gout    Hyperlipidemia    Hypertension    saw Dr. Linard Millers last earl;y- 2014, cardiac cath. last done ?2009, blocks seen didn't require any intervention at that point.   Narcolepsy    [name] 04-11-97; Mean Latency 1.90mn, SOREM 2   OSA on CPAP    NPSG 12-13-98 AHI 22.7   PONV (postoperative nausea and vomiting)    Presence of permanent cardiac pacemaker    Shortness of breath    with exertion    [place] ([place]    Type II diabetes mellitus ([facility]    Past Surgical History:  Past Surgical History:  Procedure Laterality Date   CARDIAC CATHETERIZATION  2009?   EP IMPLANTABLE DEVICE N/A 03/25/2016   Procedure: Pacemaker Implant - Dual Chamber;  Surgeon: GEvans Lance MD;  [facility];  Service: Cardiovascular;  Laterality: N/A;   FRACTURE SURGERY     INSERT / REPLACE / REMOVE PACEMAKER  03/24/2016   IR KYPHO LUMBAR INC FX REDUCE BONE BX UNI/BIL CANNULATION INC/IMAGING  12/29/2021   IR RADIOLOGIST EVAL & MGMT  12/15/2021   LEFT AND RIGHT HEART CATHETERIZATION WITH CORONARY ANGIOGRAM N/A 06/05/2014   Procedure: LEFT AND RIGHT HEART CATHETERIZATION WITH CORONARY ANGIOGRAM;  Surgeon: HSinclair Grooms MD;  [facility];   Service: Cardiovascular;  Laterality: N/A;   NASAL FRACTURE SURGERY     SHOULDER ARTHROSCOPY WITH ROTATOR CUFF REPAIR AND SUBACROMIAL DECOMPRESSION Right 08/16/2013   Procedure: SHOULDER ARTHROSCOPY WITH ROTATOR CUFF REPAIR AND SUBACROMIAL DECOMPRESSION;  Surgeon: JNita Sells MD;  [place] [facility]  Service: Orthopedics;  Laterality: Right;  Right shoulder arthroscopy rotator cuff repair, subacromial decompression.   TONSILLECTOMY      Assessment / Plan / Recommendation  [facility] 81[facility] suspected due to medications - Seroquel was started.  Pt underwent kyphoplasty on 2/28 went home started have stomach pain and dry heave.  PMH of hypertension ,noninsulin-dependent type 2 diabetes, CVA, Parkinson's, bradycardia-s/p PPM placement, OSA, narcolepsy.  He was brought to the ED by EMS due to abdominal pain - put on oxygen by EMS due to O2 sats at 90's.  Wife reported to Md that patient has sleep apnea.  Wife reported to MD that patient has intermittent chronic cough.  CT abdomen Lower chest: Small right and trace left pleural effusions new from 10/29/2021.  Per MD note, Sepsis present on admission with tachypnea, leukocytosis, lactic acidosis, fever, source of infection likely intra-abdominal  - initial CT concerning for Pancreatitis versus proximal sigmoid colon diverticulitis, with elevated lipase, normal LFT.  Per prior brain imaging studies -Chronic infarct left frontal lobe anteriorly. Small  chronic infarcts in the cerebellum bilaterally. Chronic infarcts right and left thalamic.  CIR recommended due to functional decline. Complains of cough.  Clinical Impression Patient presents with a mild cognitive impairment and mild oropharyngeal dysphagia. 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 Patient presented with mild delay when responding to more complex questions, however his accuracy was 100%. SLP performed brief bedside/clinical swallow evaluation and reviewed recent notes from acute care level SLP sessions. Patient exhibited a very mild throat clear after taking sips of water, but difficult to differentiate from his chronic coughing which was observed prior to and after PO's. SLP is recommending skilled ST services while patient in inpatient rehab to maximize his cognitive function and swallow function prior to discharge.  Skilled Therapeutic Interventions          SLUMS assessment, Bedside/clinical swallow assessment  SLP Assessment  Patient will need skilled Speech Lanaguage Pathology Services during CIR admission    Recommendations  SLP Diet Recommendations: Age appropriate regular solids;Thin Liquid Administration via: Cup;Straw Medication Administration: Whole meds with puree Supervision: Intermittent supervision to cue for compensatory strategies Compensations: Slow rate;Small sips/bites Postural Changes and/or Swallow Maneuvers: Seated upright 90 degrees;Upright 30-60 min after meal Oral Care Recommendations: Oral care BID Patient destination: Home Follow up Recommendations: None Equipment Recommended: None recommended by SLP    SLP Frequency 3 to 5 out of 7 days   SLP Duration  SLP Intensity  SLP Treatment/Interventions  8-10 days  Minumum of 1-2 x/day, 30 to 90 minutes  Cognitive remediation/compensation;Functional tasks;Medication managment;Patient/family education;Dysphagia/aspiration precaution training    Pain Pain Assessment Pain Scale: 0-10 Pain Score: 0-No pain  Prior Functioning Cognitive/Linguistic Baseline: Within functional limits Type of Home: Apartment  Lives With: Spouse Available Help at Discharge: Family Education: college graduate Vocation:  Retired  SLP Evaluation Cognition Overall Cognitive Status: No family/caregiver present to determine baseline cognitive functioning Arousal/Alertness: Awake/alert Orientation Level: Oriented X4 Year: 2023 Month: March Day of Week: Correct Attention: Sustained Sustained Attention: Appears intact Memory: Impaired Memory Impairment: Storage deficit;Other (comment) (recalled 2 of 5 words after 2 minute delay) Awareness: Appears intact Problem Solving: Impaired Problem Solving Impairment: Verbal complex Safety/Judgment: Appears intact  Comprehension Auditory Comprehension Overall Auditory Comprehension: Appears within functional limits for tasks assessed Expression Expression Primary Mode of Expression: Verbal Verbal Expression Overall Verbal Expression: Appears within functional limits for tasks assessed Initiation: No impairment Pragmatics: No impairment Written Expression Dominant Hand: Right Written Expression: Not tested Oral Motor Oral Motor/Sensory Function Overall Oral Motor/Sensory Function: Within functional limits Motor Speech Overall Motor Speech: Appears within functional limits for tasks assessed Respiration: Within functional limits Resonance: Within functional limits Articulation: Within functional limitis Intelligibility: Intelligible Motor Planning: Witnin functional limits Motor Speech Errors: Not applicable  Care Tool Care Tool Cognition Ability to hear (with hearing aid or hearing appliances if normally used Ability to hear (with hearing aid or hearing appliances if normally used): 0. Adequate - no difficulty in normal conservation, social interaction, listening to TV   Expression of Ideas and Wants Expression of Ideas and Wants: 4. Without difficulty (complex and basic) - expresses complex messages without difficulty and with speech that is clear and easy to understand  Understanding Verbal and Non-Verbal Content Understanding Verbal and Non-Verbal  Content: 4. Understands (complex and basic) - clear comprehension without cues or repetitions  Memory/Recall Ability Memory/Recall Ability : Current season;Location of own room;That he or she is in a hospital/hospital unit     Bedside Swallowing Assessment General Date of Onset: 12/31/21 Previous Swallow Assessment: MB 3/6 Diet Prior to this Study: Thin liquids;Regular Temperature Spikes Noted: No Respiratory Status: Room air History of Recent Intubation: No Behavior/Cognition: Alert;Cooperative;Pleasant mood Oral Cavity - Dentition: Other (Comment);Missing dentition;Poor condition Self-Feeding Abilities: Able to feed self Patient Positioning: Upright in bed Baseline Vocal Quality: Normal Volitional Cough: Strong Volitional Swallow: Able to elicit  Oral Care Assessment   Ice Chips Ice chips: Not tested Thin Liquid Thin Liquid: Impaired Presentation: Straw Oral Phase Impairments: Other (comment) Pharyngeal  Phase Impairments: Throat Clearing - Immediate Other Comments: Very mild intensity and amount of throat clearing. Difficult to differentiate from patient's chronic cough which occurs in absence of PO's. Nectar Thick   Honey Thick   Puree Puree: Not tested Solid Solid: Not tested BSE Assessment Risk for Aspiration Impact on safety and function: No limitations;Mild aspiration risk Other Related Risk Factors: Previous CVA;Lethargy  Short Term Goals: Week 1: SLP Short Term Goal 1 (Week 1): Patient will consume regular textures, thin liquids without overt s/s aspiration or penetration at mod I level supervision. SLP Short Term Goal 2 (Week 1): Patient will perform complex level problem solving tasks (money management, medication management) with supervision to mod I level supervision/cues. SLP Short Term Goal 3 (Week 1): Patient will recall and restate specific therapeutic and medical interventions, utilizing memory book as needed, with supervision level  assistance.  Refer to Care Plan for Long Term Goals  Recommendations for other services: None   Discharge Criteria: Patient will be discharged from SLP if patient refuses treatment 3 consecutive times without medical reason, if treatment goals not met, if there is a change in medical status, if patient makes no progress towards goals or if patient is discharged from hospital.  The above assessment, treatment plan, treatment alternatives and goals were discussed and mutually agreed upon: by patient  Sonia Baller, MA, CCC-SLP Speech Therapy

## 2022-01-09 NOTE — Progress Notes (Signed)
?                                                       PROGRESS NOTE ? ? ?Subjective/Complaints: ?Sleepy this morning ?Complains of cough- chronic ?Slow speech ? ?ROS: +cough ? ?Objective: ?  ?No results found. ?Recent Labs  ?  01/07/22 ?0502 01/09/22 ?8182  ?WBC 13.8* 13.1*  ?HGB 13.0 12.0*  ?HCT 38.4* 36.2*  ?PLT 330 384  ? ?Recent Labs  ?  01/07/22 ?0502 01/09/22 ?9937  ?NA 137 136  ?K 3.6 3.9  ?CL 104 102  ?CO2 25 22  ?GLUCOSE 162* 202*  ?BUN 21 25*  ?CREATININE 0.79 0.78  ?CALCIUM 9.0 9.0  ? ? ?Intake/Output Summary (Last 24 hours) at 01/09/2022 1509 ?Last data filed at 01/09/2022 0831 ?Gross per 24 hour  ?Intake 590 ml  ?Output 400 ml  ?Net 190 ml  ?  ? ?  ? ?Physical Exam: ?Vital Signs ?Blood pressure 137/80, pulse 75, temperature 98.5 ?F (36.9 ?C), temperature source Oral, resp. rate 17, height '5\' 8"'$  (1.727 m), weight 98.9 kg, SpO2 92 %. ?Gen: no distress, normal appearing ?HEENT: oral mucosa pink and moist, NCAT ?Cardio: Reg rate ?Chest: normal effort, normal rate of breathing ?Abd: soft, non-distended ?Genitourinary: ?   Testes:     ?   Right: Swelling not present.  ?   Comments: Condom cath in place ?Neurological:  ?   Mental Status: He is alert.  ?   Comments: Oriented to self, place and date--situation "falls". Able to follow simple motor commands without difficulty. Slow speech ? ? ?Assessment/Plan: ?1. Functional deficits which require 3+ hours per day of interdisciplinary therapy in a comprehensive inpatient rehab setting. ?Physiatrist is providing close team supervision and 24 hour management of active medical problems listed below. ?Physiatrist and rehab team continue to assess barriers to discharge/monitor patient progress toward functional and medical goals ? ?Care Tool: ? ?Bathing ?   ?   ?   ?  ?  ?Bathing assist   ?  ?  ?Upper Body Dressing/Undressing ?Upper body dressing   ?  ?   ?Upper body assist   ?   ?Lower Body Dressing/Undressing ?Lower body dressing ? ? ?   ?What is the patient  wearing?: Incontinence brief ? ?  ? ?Lower body assist Assist for lower body dressing: Moderate Assistance - Patient 50 - 74% ?   ? ?Toileting ?Toileting    ?Toileting assist Assist for toileting: Total Assistance - Patient < 25% ?  ?  ?Transfers ?Chair/bed transfer ? ?Transfers assist ?   ? ?Chair/bed transfer assist level: Minimal Assistance - Patient > 75% ?  ?  ?Locomotion ?Ambulation ? ? ?Ambulation assist ? ?   ? ?  ?  ?   ? ?Walk 10 feet activity ? ? ?Assist ?   ? ?  ?   ? ?Walk 50 feet activity ? ? ?Assist   ? ?  ?   ? ? ?Walk 150 feet activity ? ? ?Assist   ? ?  ?  ?  ? ?Walk 10 feet on uneven surface  ?activity ? ? ?Assist   ? ? ?  ?   ? ?Wheelchair ? ? ? ? ?Assist   ?  ?  ? ?  ?   ? ? ?Wheelchair 50  feet with 2 turns activity ? ? ? ?Assist ? ?  ?  ? ? ?   ? ?Wheelchair 150 feet activity  ? ? ? ?Assist ?   ? ? ?   ? ?Blood pressure 137/80, pulse 75, temperature 98.5 ?F (36.9 ?C), temperature source Oral, resp. rate 17, height '5\' 8"'$  (1.727 m), weight 98.9 kg, SpO2 92 %. ? ?Medical Problem List and Plan: ?1. Debility secondary to pancreatitis.  ?            -patient may shower ?            -ELOS/Goals: 6-8 days S ?            -Initial CIR evaluations today ?2.  Antithrombotics: ?-DVT/anticoagulation:  Pharmaceutical: Lovenox ?            -antiplatelet therapy: N/A ?3. Pain: Change tylenol to PRN ?4. Mood: LCSW to follow for evaluation and support.  ?            -antipsychotic agents: N/A ?5. Neuropsych: This patient is not capable of making decisions on his own behalf. ?6. Skin/Wound Care: Routine pressure relief measures.  ?7. Fluids/Electrolytes/Nutrition: Monitor I/O. Check CMET in am ?8. Pancreatitis: NO GI symptoms--has resolved.   ?9. Diverticulitis: Continue Augmentin. On Miralax and senna for bowel regimen.  ?10 Parkinson's disease: Recent admission for hallucinations. Discussed deep brain stimulation.  ?            --continue Sinemet IR TID with Sinemet ER at bedtime.  ?11. CAD?Chronic diastolic  CHF: Monitor for symtoms with increase in activity.  ?--Add low salt restrictions.  ?--Likely exacerbated by low protein stores. Add juven.  ?--Monitor daily weights and for signs of overload ?            --Transitioned to oral Lasix 03/09.  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 T2DM: BS poorly controlled likely due to supplements TID BETWEEN meals ?--d/c prosource. ?14. Leucocytosis: Continues to improve. Monitor for fevers and other signs of infection. ?10. Constipation: Continue Senna with mirlax. Resume probiotic.  ?11. Suboptimal vitamin D: start ergocalciferol 50,000 U once per week. ? ?LOS: ?1 days ?A FACE TO FACE EVALUATION WAS PERFORMED ? ?Martha Clan P Bladyn Tipps ?01/09/2022, 3:09 PM  ? ?  ?

## 2022-01-10 LAB — GLUCOSE, CAPILLARY
Glucose-Capillary: 147 mg/dL — ABNORMAL HIGH (ref 70–99)
Glucose-Capillary: 186 mg/dL — ABNORMAL HIGH (ref 70–99)
Glucose-Capillary: 157 mg/dL — ABNORMAL HIGH (ref 70–99)
Glucose-Capillary: 161 mg/dL — ABNORMAL HIGH (ref 70–99)

## 2022-01-10 NOTE — Progress Notes (Signed)
Pt self admins CPAP. RT will cont to monitor.  ?

## 2022-01-10 NOTE — Progress Notes (Signed)
?                                                       PROGRESS NOTE ? ? ?Subjective/Complaints: ?Sleepy this morning ? ?Pt reports no issues- and that wife will be here this afternoon.  ? ? ?ROS:  ?Pt denies SOB, abd pain, CP, N/V/C/D, and vision changes ? ? ?Objective: ?  ?No results found. ?Recent Labs  ?  01/09/22 ?0648  ?WBC 13.1*  ?HGB 12.0*  ?HCT 36.2*  ?PLT 384  ? ?Recent Labs  ?  01/09/22 ?0648  ?NA 136  ?K 3.9  ?CL 102  ?CO2 22  ?GLUCOSE 202*  ?BUN 25*  ?CREATININE 0.78  ?CALCIUM 9.0  ? ? ?Intake/Output Summary (Last 24 hours) at 01/10/2022 1604 ?Last data filed at 01/10/2022 1314 ?Gross per 24 hour  ?Intake 320 ml  ?Output 300 ml  ?Net 20 ml  ?  ? ?  ? ?Physical Exam: ?Vital Signs ?Blood pressure 97/64, pulse 80, temperature 98.2 ?F (36.8 ?C), temperature source Oral, resp. rate 18, height '5\' 8"'$  (1.727 m), weight 97.3 kg, SpO2 97 %. ? ? ?General: awake, alert, appropriate, sleepy; laying supine in bed; NAD ?HENT: conjugate gaze; oropharynx moist ?CV: regular rate; no JVD ?Pulmonary: CTA B/L; no W/R/R- good air movement ?GI: soft, NT, ND, (+)BS ?Psychiatric: appropriate ?Neurological: sleepy, but overall alert ?Neurological:  ?   Mental Status: He is alert.  ?   Comments: Oriented to self, place and date--situation "falls". Able to follow simple motor commands without difficulty. Slow speech ? ? ?Assessment/Plan: ?1. Functional deficits which require 3+ hours per day of interdisciplinary therapy in a comprehensive inpatient rehab setting. ?Physiatrist is providing close team supervision and 24 hour management of active medical problems listed below. ?Physiatrist and rehab team continue to assess barriers to discharge/monitor patient progress toward functional and medical goals ? ?Care Tool: ? ?Bathing ?   ?Body parts bathed by patient: Right arm, Left arm, Chest, Abdomen, Right upper leg, Left upper leg, Face, Front perineal area  ? Body parts bathed by helper: Buttocks, Right lower leg, Left lower leg ?   ?  ?Bathing assist Assist Level: Moderate Assistance - Patient 50 - 74% ?  ?  ?Upper Body Dressing/Undressing ?Upper body dressing   ?What is the patient wearing?: Pull over shirt ?   ?Upper body assist Assist Level: Set up assist ?   ?Lower Body Dressing/Undressing ?Lower body dressing ? ? ?   ?What is the patient wearing?: Incontinence brief, Pants ? ?  ? ?Lower body assist Assist for lower body dressing: Moderate Assistance - Patient 50 - 74% ?   ? ?Toileting ?Toileting    ?Toileting assist Assist for toileting: Total Assistance - Patient < 25% ?  ?  ?Transfers ?Chair/bed transfer ? ?Transfers assist ?   ? ?Chair/bed transfer assist level: Minimal Assistance - Patient > 75% ?  ?  ?Locomotion ?Ambulation ? ? ?Ambulation assist ? ?   ? ?  ?  ?   ? ?Walk 10 feet activity ? ? ?Assist ?   ? ?  ?   ? ?Walk 50 feet activity ? ? ?Assist   ? ?  ?   ? ? ?Walk 150 feet activity ? ? ?Assist   ? ?  ?  ?  ? ?Walk  10 feet on uneven surface  ?activity ? ? ?Assist   ? ? ?  ?   ? ?Wheelchair ? ? ? ? ?Assist   ?  ?  ? ?  ?   ? ? ?Wheelchair 50 feet with 2 turns activity ? ? ? ?Assist ? ?  ?  ? ? ?   ? ?Wheelchair 150 feet activity  ? ? ? ?Assist ?   ? ? ?   ? ?Blood pressure 97/64, pulse 80, temperature 98.2 ?F (36.8 ?C), temperature source Oral, resp. rate 18, height '5\' 8"'$  (1.727 m), weight 97.3 kg, SpO2 97 %. ? ?Medical Problem List and Plan: ?1. Debility secondary to pancreatitis.  ?            -patient may shower ?            -ELOS/Goals: 6-8 days S ?            -Initial CIR evaluations today ? -con't CIR- PT and OT-  and SLP ?2.  Antithrombotics: ?-DVT/anticoagulation:  Pharmaceutical: Lovenox ?            -antiplatelet therapy: N/A ?3. Pain: Change tylenol to PRN ?4. Mood: LCSW to follow for evaluation and support.  ?            -antipsychotic agents: N/A ?5. Neuropsych: This patient is not capable of making decisions on his own behalf. ?6. Skin/Wound Care: Routine pressure relief measures.  ?7.  Fluids/Electrolytes/Nutrition: Monitor I/O. Check CMET in am ?8. Pancreatitis: NO GI symptoms--has resolved.   ?9. Diverticulitis: Continue Augmentin. On Miralax and senna for bowel regimen.  ?10 Parkinson's disease: Recent admission for hallucinations. Discussed deep brain stimulation.  ?            --continue Sinemet IR TID with Sinemet ER at bedtime.  ?11. CAD?Chronic diastolic CHF: Monitor for symtoms with increase in activity.  ?--Add low salt restrictions.  ?--Likely exacerbated by low protein stores. Add juven.  ?--Monitor daily weights and for signs of overload ?            --Transitioned to oral Lasix 03/09.  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 T2DM: BS poorly controlled likely due to supplements TID BETWEEN meals ?--d/c prosource. ?14. Leucocytosis: Continues to improve. Monitor for fevers and other signs of infection. ? 3/12- will double check again Monday ?10. Constipation: Continue Senna with mirlax. Resume probiotic.  ? 3/12- bowels working per pt- con't regimen ?11. Suboptimal vitamin D: start ergocalciferol 50,000 U once per week. ? ? ?LOS: ?2 days ?A FACE TO FACE EVALUATION WAS PERFORMED ? ? [place] ?01/10/2022, 4:04 PM  ? ?  ?

## 2022-01-11 LAB — CBC
HCT: 37.6 % — ABNORMAL LOW (ref 39.0–52.0)
Hemoglobin: 12.4 g/dL — ABNORMAL LOW (ref 13.0–17.0)
MCH: 29.9 pg (ref 26.0–34.0)
MCHC: 33 g/dL (ref 30.0–36.0)
MCV: 90.6 fL (ref 80.0–100.0)
Platelets: 434 10*3/uL — ABNORMAL HIGH (ref 150–400)
RBC: 4.15 MIL/uL — ABNORMAL LOW (ref 4.22–5.81)
RDW: 12.9 % (ref 11.5–15.5)
WBC: 12.7 10*3/uL — ABNORMAL HIGH (ref 4.0–10.5)
nRBC: 0 % (ref 0.0–0.2)

## 2022-01-11 LAB — BASIC METABOLIC PANEL
Anion gap: 10 (ref 5–15)
BUN: 24 mg/dL — ABNORMAL HIGH (ref 8–23)
CO2: 24 mmol/L (ref 22–32)
Calcium: 9.3 mg/dL (ref 8.9–10.3)
Chloride: 103 mmol/L (ref 98–111)
Creatinine, Ser: 0.78 mg/dL (ref 0.61–1.24)
GFR, Estimated: >60 mL/min (ref >60)
Glucose, Bld: 204 mg/dL — ABNORMAL HIGH (ref 70–99)
Potassium: 4.2 mmol/L (ref 3.5–5.1)
Sodium: 137 mmol/L (ref 135–145)

## 2022-01-11 LAB — GLUCOSE, CAPILLARY
Glucose-Capillary: 151 mg/dL — ABNORMAL HIGH (ref 70–99)
Glucose-Capillary: 157 mg/dL — ABNORMAL HIGH (ref 70–99)
Glucose-Capillary: 202 mg/dL — ABNORMAL HIGH (ref 70–99)
Glucose-Capillary: 199 mg/dL — ABNORMAL HIGH (ref 70–99)

## 2022-01-11 NOTE — Progress Notes (Signed)
Inpatient Rehabilitation  Patient information reviewed and entered into eRehab system by Shivaun Bilello Anahit Klumb, OTR/L.   Information including medical coding, functional ability and quality indicators will be reviewed and updated through discharge.    

## 2022-01-11 NOTE — Progress Notes (Signed)
Patient ID: Jordan [name], male   DOB: 04/30/1941, 80 y.o.   MRN: [id number] ?Met with the patient to introduce self, review role, rehab process, team conference and plan of care. Discussed secondary risks including HF, DM, HTN and HLD. Discussed dietary modification recommendations with medications. Also reviewed protein sources and food choices. Continue to follow along to discharge to address educational needs to facilitate preparation for discharge. ,  B ? ?

## 2022-01-11 NOTE — Progress Notes (Signed)
Physical Therapy Session Note ? ?Patient Details  ?Name: Jordan [name] ?MRN: [id number] ?Date of Birth: 08-22-1941 ? ?Today's Date: 01/11/2022 ?PT Individual Time: 1100-1155 and 1779-3903 ?PT Individual Time Calculation (min): 55 min and 39 min ? ?Short Term Goals: ?Week 1:  PT Short Term Goal 1 (Week 1): STG = LTG 2/2 LOS ? ?Skilled Therapeutic Interventions/Progress Updates:  ? Treatment Session 1 ?Received pt sidelying in bed, pt agreeable to PT treatment, and reported pain in low back but did not state pain level - RN notified and present to administer pain medication. Session with emphasis on functional mobility/transfers, toileting, generalized strengthening and endurance, dynamic standing balance/coordination, and gait training. Pt transferred sidelying<>sitting EOB with supervision but unaware that he was sitting extremely close to EOB. Sit<>stand<>pivot with RW and min A and pt transported to/from room in [facility] dependently for time management purposes. Pt performed simulated car transfer with RW and min A with cues for safe entry as pt initially attempting to side step into car resulting in LOB. Pt transported to dayroom in [facility] and stood with RW and CGA - pt ambulated 8f x 1, 333fx 1, and 5930f 1 with RW and CGA/min A; limited by fatigue. Pt required cues to widen BOS, for increased step length, and to keep RW within BOS for safety. Pt requested to lie back down and returned to room, then reported urge to use restroom. Sit<>stand with RW and CGA and ambulated into bathroom with RW and CGA but required mod A when turning to sit on commode due to poor RW safety awareness, decreased balance, and slight difficulty with motor planning - max A to doff brief/pants. Pt left in care of NT/RN due to time restrictions.  ? ?Treatment Session 2 ?Received pt semi-reclined in bed asleep. Upon wakening, pt agreeable to PT treatment, and did not state pain level during session. Session with emphasis on functional  mobility/transfers, generalized strengthening and endurance, toileting, and dynamic standing balance/coordination. Pt transferred semi-reclined<>sitting EOB with supervision using bedrails. Donned shoes with max A for time management purposes then transferred bed<>WC with RW and min A. [facility]. Pt ambulated 5ft83f trials with RW and CGA to/from Nustep and performed BUE/LE strengthening on Nustep at workload 4 for 8 minutes for a total of 409 steps with emphasis on cardiovascular endurance - cues to keep steps/min >60 and for attention to activity rather than talking as pt with difficulty dual-tasking. Returned to room and pt reported urge to void. Sit<>stand with RW and CGA and voided in urinal while standing with CGA - mod A for clothing management. Returned to bed, doffed shoes, and transferred sit<>supine with supervision. Concluded session with pt semi-reclined in bed, needs within reach, and bed alarm on.  ? ?Therapy Documentation ?Precautions:  ?Precautions ?Precautions: Fall, Back ?Precaution Comments: L2 kyphoplasty 12/29/21 ?Restrictions ?Weight Bearing Restrictions: No ? ?Therapy/Group: Individual Therapy ?AnnaBlenda NicelynaBecky Sax DPT  ?01/11/2022, 7:27 AM  ?

## 2022-01-11 NOTE — IPOC Note (Signed)
Overall Plan of Care (IPOC) ?Patient Details ?Name: Jordan [name] ?MRN: [id number] ?DOB: May 29, 1941 ? ?Admitting Diagnosis: Debility ? ?Hospital Problems: Principal Problem: ?  Debility ? ? ? ? Functional Problem List: ?Nursing Medication Management, Safety, Bladder, Bowel, Endurance, Pain  ?PT Balance, Endurance, Motor  ?OT Balance, Cognition, Endurance, Sensory, Motor, Vision  ?SLP Cognition  ?TR    ?    ? Basic ADL?s: ?OT Bathing, Dressing, Toileting  ? ?  Advanced  ADL?s: ?OT    ?   ?Transfers: ?PT Bed to Chair, Car  ?OT Toilet, Tub/Shower  ? ?  Locomotion: ?PT Ambulation  ? ?  Additional Impairments: ?OT None  ?SLP Swallowing ?  ?   ?TR    ? ? ?Anticipated Outcomes ?Item Anticipated Outcome  ?Self Feeding independent  ?Swallowing ? mod I ?  ?Basic self-care ? supervision with dressing and bathing  ?Toileting ? supervision ?  ?Bathroom Transfers supervision to toilet, MIN to shower  ?Bowel/Bladder ? manage bowel w mod I  and bladder w toileting  ?Transfers ? Supervision  ?Locomotion ? Supervision  ?Communication ? N/A  ?Cognition ? mod I mild complex to complex problem solving  ?Pain ? pain at or below level 4 with prns  ?Safety/Judgment ? maintain safety with cues/reminders  ? ?Therapy Plan: ?PT Intensity: Minimum of 1-2 x/day ,45 to 90 minutes ?PT Frequency: 5 out of 7 days ?PT Duration Estimated Length of Stay: 8-10 days ?OT Intensity: Minimum of 1-2 x/day, 45 to 90 minutes ?OT Frequency: 5 out of 7 days ?OT Duration/Estimated Length of Stay: 8-10 days ?SLP Intensity: Minumum of 1-2 x/day, 30 to 90 minutes ?SLP Frequency: 3 to 5 out of 7 days ?SLP Duration/Estimated Length of Stay: 8-10 days  ? ?Due to the current state of emergency, patients may not be receiving their 3-hours of Medicare-mandated therapy. ? ? Team Interventions: ?Nursing Interventions Bladder Management, Disease Management/Prevention, Medication Management, Discharge Planning, Pain Management, Bowel Management, Patient/Family Education  ?PT  interventions Ambulation/gait training, Cognitive remediation/compensation, Discharge planning, Balance/vestibular training, Disease management/prevention, Functional mobility training, Neuromuscular re-education, Patient/family education, Stair training, Therapeutic Activities, Therapeutic Exercise, UE/LE Coordination activities  ?OT Interventions Balance/vestibular training, Discharge planning, Cognitive remediation/compensation, DME/adaptive equipment instruction, Functional mobility training, Psychosocial support, Therapeutic Activities, UE/LE Strength taining/ROM, UE/LE Coordination activities, Therapeutic Exercise, Self Care/advanced ADL retraining, Patient/family education, Neuromuscular re-education  ?SLP Interventions Cognitive remediation/compensation, Functional tasks, Medication managment, Patient/family education, Dysphagia/aspiration precaution training  ?TR Interventions    ?SW/CM Interventions Discharge Planning, Psychosocial Support, Patient/Family Education, Disease Management/Prevention  ? ?Barriers to Discharge ?MD  Medical stability, Home enviroment access/loayout, Wound care, Weight, Medication compliance, and Behavior  ?Nursing Decreased caregiver support ?1 level/elevator to 10th floor, w wife; handicapped son  ?PT   ?   ?OT   ?   ?SLP Decreased caregiver support ?   ?SW Medication compliance, Lack of/limited family support, Decreased caregiver support, Behavior ?Patient lives in [place] on 10th floor with spouse and autistic son (has Media planner)  ? ?Team Discharge Planning: ?Destination: PT-Home ,OT- Home , SLP-Home ?Projected Follow-up: PT-Home health PT, OT-  Outpatient OT, SLP-None ?Projected Equipment Needs: PT-To be determined, OT- To be determined, SLP-None recommended by SLP ?Equipment Details: PT- , OT-pt has toilet rails, elevated seat and shower chair ?Patient/family involved in discharge planning: PT- Family member/caregiver, Patient,  OT-Patient, SLP-Patient ? ?MD ELOS: 8-10  days ?Medical Rehab Prognosis:  Good ?Assessment:  ?The patient has been admitted for CIR therapies with the diagnosis of debility from pancreatitis. The team  will be addressing functional mobility, strength, stamina, balance, safety, adaptive techniques and equipment, self-care, bowel and bladder mgt, patient and caregiver education, dealing with leukocytosis. Goals have been set at supervision. Anticipated discharge destination is home. ? ?Due to the current state of emergency, patients may not be receiving their 3 hours per day of Medicare-mandated therapy.  ? ? ? ? ? ?See Team Conference Notes for weekly updates to the plan of care  ?

## 2022-01-11 NOTE — Progress Notes (Signed)
Inpatient Rehabilitation Center ?Individual Statement of Services ? ?Patient Name:  Jordan [name]  ?Date:  01/11/2022 ? ?Welcome to the [facility].  Our goal is to provide you with an individualized program based on your diagnosis and situation, designed to meet your specific needs.  With this comprehensive rehabilitation program, you will be expected to participate in at least 3 hours of rehabilitation therapies Monday-Friday, with modified therapy programming on the weekends. ? ?Your rehabilitation program will include the following services:  Physical Therapy (PT), Occupational Therapy (OT), Speech Therapy (ST), 24 hour per day rehabilitation nursing, Therapeutic Recreaction (TR), Neuropsychology, Care Coordinator, Rehabilitation Medicine, Nutrition Services, Pharmacy Services, and Other ? ?Weekly team conferences will be held on Wednesdays to discuss your progress.  Your Inpatient Rehabilitation Care Coordinator will talk with you frequently to get your input and to update you on team discussions.  Team conferences with you and your family in attendance may also be held. ? ?Expected length of stay:  12 Days   Overall anticipated outcome:  MOD I to Supervision ? ?Depending on your progress and recovery, your program may change. Your Inpatient Rehabilitation Care Coordinator will coordinate services and will keep you informed of any changes. Your Inpatient Rehabilitation Care Coordinator's name and contact numbers are listed  below. ? ?The following services may also be recommended but are not provided by the [facility]:  ? ?Home Health Rehabiltiation Services ?Outpatient Rehabilitation Services ? ?  ?Arrangements will be made to provide these services after discharge if needed.  Arrangements include referral to agencies that provide these services. ? ?Your insurance has been verified to be:  [employer] ?Your primary doctor is:  [name], MD ? ?Pertinent  information will be shared with your doctor and your insurance company. ? ?Inpatient Rehabilitation Care Coordinator:  Erlene Quan, [place] or ([phone] ? ?Information discussed with and copy given to patient by: Dyanne Iha, 01/11/2022, 11:16 AM    ?

## 2022-01-11 NOTE — Progress Notes (Signed)
?                                                       PROGRESS NOTE ? ? ?Subjective/Complaints: ? ?Pt reports he wants ot sleep- has no issues- feels "fine".  ?Denies pain.  ? ?Ate 75% breakfast.  ?ROS:  ? ?Pt denies SOB, abd pain, CP, N/V/C/D, and vision changes ? ? ?Objective: ?  ?No results found. ?Recent Labs  ?  01/09/22 ?0648 01/11/22 ?0810  ?WBC 13.1* 12.7*  ?HGB 12.0* 12.4*  ?HCT 36.2* 37.6*  ?PLT 384 434*  ? ?Recent Labs  ?  01/09/22 ?0648 01/11/22 ?0810  ?NA 136 137  ?K 3.9 4.2  ?CL 102 103  ?CO2 22 24  ?GLUCOSE 202* 204*  ?BUN 25* 24*  ?CREATININE 0.78 0.78  ?CALCIUM 9.0 9.3  ? ? ?Intake/Output Summary (Last 24 hours) at 01/11/2022 1121 ?Last data filed at 01/11/2022 0947 ?Gross per 24 hour  ?Intake 440 ml  ?Output 525 ml  ?Net -85 ml  ?  ? ?  ? ?Physical Exam: ?Vital Signs ?Blood pressure 128/72, pulse 65, temperature 97.8 ?F (36.6 ?C), resp. rate 16, height '5\' 8"'$  (1.727 m), weight 97.1 kg, SpO2 93 %. ? ? ? ?General: awake, alert, appropriate, sleepy - "doesn't want to be disturbed". NAD ?HENT: conjugate gaze; oropharynx moist ?CV: regular rate; no JVD ?Pulmonary: CTA B/L; no W/R/R- good air movement ?GI: soft, NT, ND, (+)BS ?Psychiatric: appropriate- irritable ?Neurological: alert, but sleepy ? ?Neurological:  ?   Mental Status: He is alert.  ?   Comments: Oriented to self, place and date--situation "falls". Able to follow simple motor commands without difficulty. Slow speech ? ? ?Assessment/Plan: ?1. Functional deficits which require 3+ hours per day of interdisciplinary therapy in a comprehensive inpatient rehab setting. ?Physiatrist is providing close team supervision and 24 hour management of active medical problems listed below. ?Physiatrist and rehab team continue to assess barriers to discharge/monitor patient progress toward functional and medical goals ? ?Care Tool: ? ?Bathing ?   ?Body parts bathed by patient: Right arm, Left arm, Chest, Abdomen, Right upper leg, Left upper leg, Face, Front  perineal area  ? Body parts bathed by helper: Buttocks, Right lower leg, Left lower leg ?  ?  ?Bathing assist Assist Level: Moderate Assistance - Patient 50 - 74% ?  ?  ?Upper Body Dressing/Undressing ?Upper body dressing   ?What is the patient wearing?: Pull over shirt ?   ?Upper body assist Assist Level: Set up assist ?   ?Lower Body Dressing/Undressing ?Lower body dressing ? ? ?   ?What is the patient wearing?: Incontinence brief, Pants ? ?  ? ?Lower body assist Assist for lower body dressing: Moderate Assistance - Patient 50 - 74% ?   ? ?Toileting ?Toileting    ?Toileting assist Assist for toileting: Total Assistance - Patient < 25% ?  ?  ?Transfers ?Chair/bed transfer ? ?Transfers assist ?   ? ?Chair/bed transfer assist level: Minimal Assistance - Patient > 75% ?  ?  ?Locomotion ?Ambulation ? ? ?Ambulation assist ? ?   ? ?  ?  ?   ? ?Walk 10 feet activity ? ? ?Assist ?   ? ?  ?   ? ?Walk 50 feet activity ? ? ?Assist   ? ?  ?   ? ? ?  Walk 150 feet activity ? ? ?Assist   ? ?  ?  ?  ? ?Walk 10 feet on uneven surface  ?activity ? ? ?Assist   ? ? ?  ?   ? ?Wheelchair ? ? ? ? ?Assist   ?  ?  ? ?  ?   ? ? ?Wheelchair 50 feet with 2 turns activity ? ? ? ?Assist ? ?  ?  ? ? ?   ? ?Wheelchair 150 feet activity  ? ? ? ?Assist ?   ? ? ?   ? ?Blood pressure 128/72, pulse 65, temperature 97.8 ?F (36.6 ?C), resp. rate 16, height '5\' 8"'$  (1.727 m), weight 97.1 kg, SpO2 93 %. ? ?Medical Problem List and Plan: ?1. Debility secondary to pancreatitis.  ?            -patient may shower ?            -ELOS/Goals: 6-8 days S ?            -con't CIR- PT, OT ?2.  Antithrombotics: ?-DVT/anticoagulation:  Pharmaceutical: Lovenox ?            -antiplatelet therapy: N/A ?3. Pain: Change tylenol to PRN ?4. Mood: LCSW to follow for evaluation and support.  ?            -antipsychotic agents: N/A ?5. Neuropsych: This patient is not capable of making decisions on his own behalf. ?6. Skin/Wound Care: Routine pressure relief measures.  ?7.  Fluids/Electrolytes/Nutrition: Monitor I/O. Check CMET in am ?8. Pancreatitis: NO GI symptoms--has resolved.   ?9. Diverticulitis: Continue Augmentin. On Miralax and senna for bowel regimen.  ?10 Parkinson's disease: Recent admission for hallucinations. Discussed deep brain stimulation.  ?            --continue Sinemet IR TID with Sinemet ER at bedtime.  ?11. CAD?Chronic diastolic CHF: Monitor for symtoms with increase in activity.  ?--Add low salt restrictions.  ?--Likely exacerbated by low protein stores. Add juven.  ?--Monitor daily weights and for signs of overload ?            --Transitioned to oral Lasix 03/09.  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 T2DM: BS poorly controlled likely due to supplements TID BETWEEN meals ?--d/c prosource. ?3/13- 151-186- on metformin 1000 mg daily- suggest increasing.  ?14. Leucocytosis: Continues to improve. Monitor for fevers and other signs of infection. ? 3/12- will double check again Monday ? 3/13- WBC down slightly to 12.7 from 13.1- con't to monitor ?10. Constipation: Continue Senna with mirlax. Resume probiotic.  ? 3/12- bowels working per pt- con't regimen ?3/13- LBM last night- might not be going enough ?11. Suboptimal vitamin D: start ergocalciferol 50,000 U once per week. ? ? ?I spent a total of 37   minutes on total care today- >50% coordination of care- due to [facility] and reviewing chart  ? ? ?LOS: ?3 days ?A FACE TO FACE EVALUATION WAS PERFORMED ? ?Aerith Canal ?01/11/2022, 11:21 AM  ? ?  ?

## 2022-01-11 NOTE — Progress Notes (Signed)
Occupational Therapy Session Note ? ?Patient Details  ?Name: Jordan [name] ?MRN: [id number] ?Date of Birth: 1941/05/29 ? ?Today's Date: 01/11/2022 ?OT Individual Time: 7615-1834 ?OT Individual Time Calculation (min): 69 min  ? ? ?Short Term Goals: ?Week 1:  OT Short Term Goal 1 (Week 1): STGs = LTGs ? ?Skilled Therapeutic Interventions/Progress Updates:  ?  Pt received in bed agreeable to a shower.  Pt sat to EOB with min A. He felt dizzy so needed to sit for a few minutes. Pt still had condom cath on so that needed to be removed. Pt stood to RW with min A and ambulated with min A to toilet, mod A to turn around to sit on toilet as pt tends to bring hips back too soon before feet are in position. Needs mod cues for awareness of feet positon. Pt unable to void. Transferred with min - mod A to shower bench and then bathed with min A for feet. Dried off and transferred to wc as pt not ready to use RW as feet were bare. In wc pt dressed LB with min A.  Used sock aide and min A with shoes.  Donned shirt with slight assist.   ?Worked on sit to stands and holding stand balance to get pants over hips with min A. ? ?Discussed his "bacK" precautions that he was presumed to have.  No documentation from surgeon and pt does not recall having them.  Will talk with his PT.  Pt often has back pain and finds it more comfortable to sit in a flexed position. ? ?Pt resting in wc with all needs met, no alarm on as PT arriving in 15 min. Pt knows not to get up and Nsg staff aware.   ? ?Therapy Documentation ?Precautions:  ?Precautions ?Precautions: Fall, Back ?Precaution Comments: L2 kyphoplasty 12/29/21 ?Restrictions ?Weight Bearing Restrictions: No ? ?  ? ?  ?Pain: ?Pain Assessment ?Pain Location: Back ?Pain Orientation: Lower ?Pain Descriptors / Indicators: Aching ?Pain Frequency: Intermittent ?Pain Onset: On-going ?Patients Stated Pain Goal: 0 ?Pain Intervention(s): Medication (See eMAR) ?ADL: ?ADL ?Eating: Set up ?Grooming:  Setup ?Upper Body Bathing: Setup ?Lower Body Bathing: Moderate assistance ?Upper Body Dressing: Supervision/safety ?Lower Body Dressing: Maximal assistance ?Toileting: Maximal assistance ?Toilet Transfer: Minimal assistance ?Toilet Transfer Method: Stand pivot ?Toilet Transfer Equipment: Raised toilet seat, Grab bars ? ? ?Therapy/Group: Individual Therapy ? ?[place] ?01/11/2022, 12:44 PM ?

## 2022-01-11 NOTE — Progress Notes (Signed)
Inpatient Rehabilitation Care Coordinator ?Assessment and Plan ?Patient Details  ?Name: Jordan [name] ?MRN: [id number] ?Date of Birth: [birth year] ? ?Today's Date: 01/11/2022 ? ?Hospital Problems: Principal Problem: ?  Debility ? ?Past Medical History:  ?Past Medical History:  ?Diagnosis Date  ? Arthritis   ? R shoulder, bone spur  ? Arthritis   ? "hands" (03/25/2016)  ? Chronic diastolic heart failure ([place])   ? Chronic lower back pain   ? Coronary heart disease   ? Dr Pernell Dupre  ? H/O cardiovascular stress test   ? perhaps last one was 2009  ? Heart murmur   ? 12/29/11 echo: mild MR, no AS, trivial TR  ? History of gout   ? Hyperlipidemia   ? Hypertension   ? saw Dr. Linard Millers last earl;y- 2014, cardiac cath. last done ?2009, blocks seen didn't require any intervention at that point.  ? Narcolepsy   ? [name] 04-11-97; Mean Latency 1.21mn, SOREM 2  ? OSA on CPAP   ? NPSG 12-13-98 AHI 22.7  ? PONV (postoperative nausea and vomiting)   ? Presence of permanent cardiac pacemaker   ? Shortness of breath   ? with exertion   ? Stroke ([facility]   ? Type II diabetes mellitus ([place]   ? ?Past Surgical History:  ?Past Surgical History:  ?Procedure Laterality Date  ? CARDIAC CATHETERIZATION  2009?  ? EP IMPLANTABLE DEVICE N/A 03/25/2016  ? Procedure: Pacemaker Implant - Dual Chamber;  Surgeon: GEvans Lance MD;  [facility];  Service: Cardiovascular;  Laterality: N/A;  ? FRACTURE SURGERY    ? INSERT / REPLACE / REMOVE PACEMAKER  03/24/2016  ? IR KYPHO LUMBAR INC FX REDUCE BONE BX UNI/BIL CANNULATION INC/IMAGING  12/29/2021  ? IR RADIOLOGIST EVAL & MGMT  12/15/2021  ? LEFT AND RIGHT HEART CATHETERIZATION WITH CORONARY ANGIOGRAM N/A 06/05/2014  ? Procedure: LEFT AND RIGHT HEART CATHETERIZATION WITH CORONARY ANGIOGRAM;  Surgeon: HSinclair Grooms MD;  [facility];  Service: Cardiovascular;  Laterality: N/A;  ? NASAL FRACTURE SURGERY    ? SHOULDER ARTHROSCOPY WITH ROTATOR CUFF REPAIR AND SUBACROMIAL DECOMPRESSION Right  08/16/2013  ? Procedure: SHOULDER ARTHROSCOPY WITH ROTATOR CUFF REPAIR AND SUBACROMIAL DECOMPRESSION;  Surgeon: JNita Sells MD;  [facility]  Service: Orthopedics;  Laterality: Right;  Right shoulder arthroscopy rotator cuff repair, subacromial decompression.  ? TONSILLECTOMY    ? ?Social History:  reports that he quit smoking about 54 years ago. His smoking use included cigarettes. He has a 15.00 pack-year smoking history. He has never used smokeless tobacco. He reports current alcohol use. He reports that he does not use drugs. ? ?Family / Support Systems ?Marital Status: Married ?Patient Roles: Spouse ?Spouse/Significant Other: KRaymond Gurney?Children: son ?Anticipated Caregiver: KCamryn Quesinberry?Ability/Limitations of Caregiver: 248y.o son (autistic), none ?Caregiver Availability: 24/7 ?Family Dynamics: support from spouse and son ? ?Social History ?Preferred language: English ?Religion: Presbyterian ?Health Literacy - How often do you need to have someone help you when you read instructions, pamphlets, or other written material from your doctor or pharmacy?: Never ?Writes: Yes ?Employment Status: Disabled ?Legal History/Current Legal Issues: n/a ?Guardian/Conservator: spouse  ? ?Abuse/Neglect ?Abuse/Neglect Assessment Can Be Completed: Yes ?Physical Abuse: Denies ?Verbal Abuse: Denies ?Sexual Abuse: Denies ?Exploitation of patient/patient's resources: Denies ?Self-Neglect: Denies ? ?Patient response to: ?Social Isolation - How often do you feel lonely or isolated from those around you?: Never ? ?Emotional Status ?Recent Psychosocial Issues: coping ?Psychiatric History:  n/a ?Substance Abuse History: n/a ? ?Patient / Family Perceptions, Expectations & Goals ?Pt/Family understanding of illness & functional limitations: yes ?Premorbid pt/family roles/activities: patient previously went out for appointments ?Anticipated changes in roles/activities/participation: spouse able to assist with roles and  tasks ?Pt/family expectations/goals: MOD I to Supervision ? ?Community Resources ?Community Agencies: None ?Premorbid Home Care/DME Agencies: Other (Comment) Administrator, Civil Service, Nurse, children's) ?Transportation available at discharge: spouse able to transport ?Is the patient able to respond to transportation needs?: Yes ?In the past 12 months, has lack of transportation kept you from medical appointments or from getting medications?: No ?In the past 12 months, has lack of transportation kept you from meetings, work, or from getting things needed for daily living?: No ?Resource referrals recommended: Neuropsychology ? ?Discharge Planning ?Living Arrangements: Spouse/significant other, Children ?Support Systems: Spouse/significant other, Children ?Type of Residence: Private residence ?Insurance Resources: Multimedia programmer (specify) Psychologist, counselling Medicare) ?Financial Resources: Social Security, SSD, Family Support ?Financial Screen Referred: No ?Living Expenses: Lives with family ?Money Management: Patient, Spouse ?Does the patient have any problems obtaining your medications?: No ?Home Management: Spouse assisting ?Patient/Family Preliminary Plans: Spouse plans to continue ?Care Coordinator Barriers to Discharge: Medication compliance, Lack of/limited family support, Decreased caregiver support, Behavior ?Care Coordinator Barriers to Discharge Comments: Patient lives in [place] on 10th floor with spouse and autistic son (has Media planner) ?Care Coordinator Anticipated Follow Up Needs: HH/OP ?DC Planning Additional Notes/Comments: Sleep Apnea ?Expected length of stay: 12 Days ? ?Clinical Impression ?SW made attempt to see patient, patient in bathroom. SW called pt spouse, introduced self and explained role. No additional questions or concerns, sw will continue to follow up. Patient plans to discharge home with spouse and autistic son. Patient has elevator to enter. ? ?Dyanne Iha ?01/11/2022, 12:15 PM ? ?  ?

## 2022-01-12 LAB — GLUCOSE, CAPILLARY
Glucose-Capillary: 149 mg/dL — ABNORMAL HIGH (ref 70–99)
Glucose-Capillary: 155 mg/dL — ABNORMAL HIGH (ref 70–99)
Glucose-Capillary: 192 mg/dL — ABNORMAL HIGH (ref 70–99)
Glucose-Capillary: 206 mg/dL — ABNORMAL HIGH (ref 70–99)

## 2022-01-12 MED ORDER — MAGNESIUM GLUCONATE 500 MG PO TABS
250.0000 mg | ORAL_TABLET | Freq: Every day | ORAL | Status: DC
  Administered 2022-01-12 – 2022-01-17 (×6): 250 mg via ORAL
  Filled 2022-01-12 (×6): qty 1

## 2022-01-12 NOTE — Progress Notes (Signed)
Occupational Therapy Session Note ? ?Patient Details  ?Name: Jordan [name] ?MRN: [id number] ?Date of Birth: 1940-12-10 ? ?Today's Date: 01/12/2022 ?OT Individual Time: 5784-6962 ?OT Individual Time Calculation (min): 72 min  ? ? ?Short Term Goals: ?Week 1:  OT Short Term Goal 1 (Week 1): STGs = LTGs ? ?Skilled Therapeutic Interventions/Progress Updates:  ?Skilled OT intervention completed with focus on self-care, activity tolerance. Pt received supine in bed, agreeable to session. Pt declined full shower but requesting to sponge bathe at EOB. Bed mobility completed with increased time but at supervision level. Note- pt with poor initiation with tasks, cues needed for sequencing and initiating, with need of increased time to motor plan throughout session with focus on maximizing pt's decision making to observe safety awareness. ? ?Seated EOB pt completed bathing with min A only for feet, excluding pericare as pt was dry with pull up on. Pt able to donn shirt with supervision, LB dress with CGA only at the sit > stand level while using RW. Pt required min A for stands during session. Pt completed stand pivot with CGA to w/c with use of RW. Encouraged pt to complete standing grooming tasks at sink, however declined due to his "back hurting with standing with long periods of time." Pt completed seated oral care and other grooming with supervision with cues needed for operating electric toothbrush, as well as putting cover back on tooth brush. Slight swelling in bilateral calves from personal socks, with therapist donning thigh high TEDs per order and shoes with total A for time. Encouraged pt to sit up in w/c for next PT session, with pt asking for specific time of how long to wait until he doesn't have to sit up if PT is late. Therapist educated pt on need of asking for assist via call bell, with belt alarm applied and on at end of session. Pt was left seated in w/c, with all needs in reach and met at therapist  departure. ? ? ?Therapy Documentation ?Precautions:  ?Precautions ?Precautions: Fall, Back ?Precaution Comments: L2 kyphoplasty 12/29/21 ?Restrictions ?Weight Bearing Restrictions: No ? ?Pain: ?No c/o pain ? ? ?Therapy/Group: Individual Therapy ? ?[place] ?01/12/2022, 7:33 AM ?

## 2022-01-12 NOTE — Progress Notes (Signed)
Physical Therapy Session Note ? ?Patient Details  ?Name: Jordan [name] ?MRN: [id number] ?Date of Birth: [birth year] ? ?Today's Date: 01/12/2022 ?PT Individual Time: 2423-5361 ?PT Individual Time Calculation (min): 33 min  ? ?Short Term Goals: ?Week 1:  PT Short Term Goal 1 (Week 1): STG = LTG 2/2 LOS ? ?Skilled Therapeutic Interventions/Progress Updates:  ?Patient seated upright in w/c on entrance to room. Patient alert and agreeable to PT session.  ? ?Patient with minimal pain complaint throughout session. Bt does relate continuous but minimal pain in low back.  ? ?Therapeutic Activity: ?Transfers: Patient performed sit<>stand and stand pivot transfers throughout session with CGA and improving to close supervision. Provided verbal cues for attempted technique with no UE but pt requires add'l strengthening and UE push from armrests to complete as well as to control descent to sit. Provided with continuous vc for using BUE to assist with slowing during descent to sit and reduce potential for injury to back.  ? ?Gait Training:  ?Patient ambulated 42' x1/ 27' x1 using RW with CGA/ close supervision. Demonstrated slow pace. Provided vc/ tc for energy conservation and maintaining adequate step length/ height as well as upright posture and level gaze. ? ?Ambulates into/ and out of bathroom with CGA using RW. Brief change with Max A for time. Doffing/ donning clothes with MinA. ? ? ?Patient seated upright  in w/c  at end of session with brakes locked, belt alarm set, and all needs within reach. Oriented to time and time of next therapy session.  ?   ? ?Therapy Documentation ?Precautions:  ?Precautions ?Precautions: Fall, Back ?Precaution Comments: L2 kyphoplasty 12/29/21 ?Restrictions ?Weight Bearing Restrictions: No ?General: ?  ?Pain: ?Pain Assessment ?Pain Scale: 0-10 ?Pain Score: 0-No pain ? ?Therapy/Group: Individual Therapy ? ?Alger Simons PT, DPT ?01/12/2022, 10:50 AM  ?

## 2022-01-12 NOTE — Progress Notes (Signed)
?                                                       PROGRESS NOTE ? ? ?Subjective/Complaints: ?No new complaints this morning ?Sleepy ?Tolerated OT tiday  ?Bathing performed minA ? ? ?ROS:  ? ?Pt denies SOB, abd pain, CP, N/V/C/D, and vision changes ?+back pain ? ? ?Objective: ?  ?No results found. ?Recent Labs  ?  01/11/22 ?0810  ?WBC 12.7*  ?HGB 12.4*  ?HCT 37.6*  ?PLT 434*  ? ?Recent Labs  ?  01/11/22 ?0810  ?NA 137  ?K 4.2  ?CL 103  ?CO2 24  ?GLUCOSE 204*  ?BUN 24*  ?CREATININE 0.78  ?CALCIUM 9.3  ? ? ?Intake/Output Summary (Last 24 hours) at 01/12/2022 1201 ?Last data filed at 01/12/2022 0758 ?Gross per 24 hour  ?Intake 240 ml  ?Output 425 ml  ?Net -185 ml  ?  ? ?  ? ?Physical Exam: ?Vital Signs ?Blood pressure 129/75, pulse 65, temperature 97.8 ?F (36.6 ?C), resp. rate 15, height '5\' 8"'$  (1.727 m), weight 98.2 kg, SpO2 94 %. ?General: awake, alert, appropriate, sleepy - "doesn't want to be disturbed". NAD, BMI 32.92 ?HENT: conjugate gaze; oropharynx moist ?CV: regular rate; no JVD ?Pulmonary: CTA B/L; no W/R/R- good air movement ?GI: soft, NT, ND, (+)BS ?Psychiatric: appropriate- irritable ?Neurological: alert, but sleepy ? ?Neurological:  ?   Mental Status: He is alert.  ?   Comments: Oriented to self, place and date--situation "falls". Able to follow simple motor commands without difficulty. Slow speech ? ? ?Assessment/Plan: ?1. Functional deficits which require 3+ hours per day of interdisciplinary therapy in a comprehensive inpatient rehab setting. ?Physiatrist is providing close team supervision and 24 hour management of active medical problems listed below. ?Physiatrist and rehab team continue to assess barriers to discharge/monitor patient progress toward functional and medical goals ? ?Care Tool: ? ?Bathing ?   ?Body parts bathed by patient: Right arm, Left arm, Chest, Abdomen, Right upper leg, Left upper leg, Face, Right lower leg, Left lower leg  ? Body parts bathed by helper: Buttocks, Right lower  leg, Left lower leg ?  ?  ?Bathing assist Assist Level: Minimal Assistance - Patient > 75% ?  ?  ?Upper Body Dressing/Undressing ?Upper body dressing   ?What is the patient wearing?: Pull over shirt ?   ?Upper body assist Assist Level: Supervision/Verbal cueing ?   ?Lower Body Dressing/Undressing ?Lower body dressing ? ? ?   ?What is the patient wearing?: Pants ? ?  ? ?Lower body assist Assist for lower body dressing: Contact Guard/Touching assist ?   ? ?Toileting ?Toileting    ?Toileting assist Assist for toileting: Total Assistance - Patient < 25% ?  ?  ?Transfers ?Chair/bed transfer ? ?Transfers assist ?   ? ?Chair/bed transfer assist level: Minimal Assistance - Patient > 75% ?  ?  ?Locomotion ?Ambulation ? ? ?Ambulation assist ? ? Ambulation activity did not occur: Safety/medical concerns ? ?Assist level: Minimal Assistance - Patient > 75% ?Assistive device: Walker-rolling ?Max distance: 81f  ? ?Walk 10 feet activity ? ? ?Assist ?   ? ?Assist level: Minimal Assistance - Patient > 75% ?Assistive device: Walker-rolling  ? ?Walk 50 feet activity ? ? ?Assist   ? ?Assist level: Minimal Assistance - Patient > 75% ?Assistive device: Walker-rolling  ? ? ?  Walk 150 feet activity ? ? ?Assist Walk 150 feet activity did not occur: Safety/medical concerns ? ?  ?  ?  ? ?Walk 10 feet on uneven surface  ?activity ? ? ?Assist Walk 10 feet on uneven surfaces activity did not occur: Safety/medical concerns ? ? ?  ?   ? ?Wheelchair ? ? ? ? ?Assist   ?  ?Wheelchair activity did not occur: Safety/medical concerns ? ?  ?   ? ? ?Wheelchair 50 feet with 2 turns activity ? ? ? ?Assist ? ?  ?Wheelchair 50 feet with 2 turns activity did not occur: Safety/medical concerns ? ? ?   ? ?Wheelchair 150 feet activity  ? ? ? ?Assist ? Wheelchair 150 feet activity did not occur: Safety/medical concerns ? ? ?   ? ?Blood pressure 129/75, pulse 65, temperature 97.8 ?F (36.6 ?C), resp. rate 15, height '5\' 8"'$  (1.727 m), weight 98.2 kg, SpO2 94  %. ? ?Medical Problem List and Plan: ?1. Debility secondary to pancreatitis.  ?            -patient may shower ?            -ELOS/Goals: 6-8 days S ?            -con't CIR- PT, OT ? Messaged April to schedule HFU ?2.  Antithrombotics: ?-DVT/anticoagulation:  Pharmaceutical: Lovenox ?            -antiplatelet therapy: N/A ?3. Low back pain: add kpad ?4. Agitation at night: continue seroquel 12.'5mg'$  HS ?5. Neuropsych: This patient is not capable of making decisions on his own behalf. ?6. Skin/Wound Care: Routine pressure relief measures.  ?7. Fluids/Electrolytes/Nutrition: Monitor I/O. Check CMET in am ?8. Pancreatitis: NO GI symptoms--has resolved.   ?9. Diverticulitis: Continue Augmentin. On Miralax and senna for bowel regimen.  ?10 Parkinson's disease: Recent admission for hallucinations. Discussed deep brain stimulation.  ?            --continue Sinemet IR TID with Sinemet ER at bedtime.  ?11. CAD?Chronic diastolic CHF: Monitor for symtoms with increase in activity.  ?--Add low salt restrictions.  ?--Likely exacerbated by low protein stores. Add juven.  ?--Monitor daily weights and for signs of overload ?            --Transitioned to oral Lasix 03/09.  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 T2DM: BS poorly controlled likely due to supplements TID BETWEEN meals ?--d/c prosource and Ensure Max ?14. Leucocytosis: Continues to improve. Monitor for fevers and other signs of infection. ? 3/12- will double check again Monday ? 3/13- WBC down slightly to 12.7 from 13.1- con't to monitor ?10. Constipation: Continue Senna with mirlax. Resume probiotic. Add magnesium '250mg'$  HS ?11. Suboptimal vitamin D: start ergocalciferol 50,000 U once per week. ? ? ? ?LOS: ?4 days ?A FACE TO FACE EVALUATION WAS PERFORMED ? ?Martha Clan P Fawzi Melman ?01/12/2022, 12:01 PM  ? ?  ?

## 2022-01-12 NOTE — Progress Notes (Signed)
Physical Therapy Session Note ? ?Patient Details  ?Name: Jordan [name] ?MRN: [id number] ?Date of Birth: 11/23/1940 ? ?Today's Date: 01/12/2022 ?PT Individual Time: 7544-9201 ?PT Individual Time Calculation (min): 39 min  ? ?Short Term Goals: ?Week 1:  PT Short Term Goal 1 (Week 1): STG = LTG 2/2 LOS ? ?Skilled Therapeutic Interventions/Progress Updates:  ? Received pt supine in bed, pt agreeable to PT treatment, and denied any pain in low back today. Session with emphasis on functional mobility/transfers, generalized strengthening and endurance, dynamic standing balance/coordination, and gait training. Pt transferred supine<>sitting EOB with HOB elevated and use of bedrails with supervision and donned shoes with max A. Sit<>stand with RW and CGA and pt ambulated 75f x 1 and 344fx 1 with RW and CGA with WC follow to [facility] - cues to remain close to RW and to widen BOS. Pt then performed the following exercises with BUE support and CGA for balance with emphasis on LE strength:  ?-mini squats 2x10 ?-alternating marching 1x10 bilaterally ?-heel raises x10 ?Stand<>pivot mat<>WC with RW and min A (due to initial posterior lean upon standing up). [facility] but was resistant when therapist applied seatbelt alarm - educated pt on hospital policies and current fall risk status. Concluded session with pt sitting in WC, needs within reach, and seatbelt alarm on.  ? ?Therapy Documentation ?Precautions:  ?Precautions ?Precautions: Fall, Back ?Precaution Comments: L2 kyphoplasty 12/29/21 ?Restrictions ?Weight Bearing Restrictions: No ? ?Therapy/Group: Individual Therapy ?AnBlenda NicelyAnBecky SaxT, DPT  ?01/12/2022, 7:48 AM  ?

## 2022-01-12 NOTE — Progress Notes (Signed)
Speech Language Pathology Daily Session Note ? ?Patient Details  ?Name: Jordan [name] ?MRN: [id number] ?Date of Birth: Jul 03, 1941 ? ?Today's Date: 01/12/2022 ?SLP Individual Time: 6433-2951 ?SLP Individual Time Calculation (min): 60 min ? ?Short Term Goals: ?Week 1: SLP Short Term Goal 1 (Week 1): Patient will consume regular textures, thin liquids without overt s/s aspiration or penetration at mod I level supervision. ?SLP Short Term Goal 2 (Week 1): Patient will perform complex level problem solving tasks (money management, medication management) with supervision to mod I level supervision/cues. ?SLP Short Term Goal 3 (Week 1): Patient will recall and restate specific therapeutic and medical interventions, utilizing memory book as needed, with supervision level assistance. ? ?Skilled Therapeutic Interventions: Skilled ST treatment focused on cognitive and swallowing goals. Pt was observed consuming single and sequential sips of thin liquids via cup and straw without overt s/sx of aspiration. Pt exhibited occasional cough throughout session without presentation of PO intake. Plan to assess tolerance of regular textures prior to signing of on swallowing. SLP facilitated session by providing education re: external memory strategies including writing things down on to-do lists, calendar, and inputting information into phone. Pt reports he struggles with using phone secondary to tremors from Parkinson's. SLP provided training on using hands-free voice-to-text option for texting, Google searches, and making phone calls, etc. Pt executed with supervision A verbal cues, but required intermittent hands-on assist to repair errors due to pressing multiple buttons at once and understanding how to "go back" or return to home screen. Pt benefited from intermittent verbal redirection for topic maintenance due to tangential speech. Pt required sup A verbal cues and clarification for recalling why he was initially hospitalized.  Patient was left in wheelchair with alarm activated and immediate needs within reach at end of session. Continue per current plan of care.   ?   ?Pain ?Pain Assessment ?Pain Scale: 0-10 ?Pain Score: 0-No pain ? ?Therapy/Group: Individual Therapy ? ?Paschal Blanton T Wallis Vancott ?01/12/2022, 11:10 AM ?

## 2022-01-13 ENCOUNTER — Other Ambulatory Visit: Payer: Self-pay

## 2022-01-13 LAB — BASIC METABOLIC PANEL
Anion gap: 12 (ref 5–15)
BUN: 26 mg/dL — ABNORMAL HIGH (ref 8–23)
CO2: 22 mmol/L (ref 22–32)
Calcium: 9.7 mg/dL (ref 8.9–10.3)
Chloride: 103 mmol/L (ref 98–111)
Creatinine, Ser: 0.77 mg/dL (ref 0.61–1.24)
GFR, Estimated: >60 mL/min (ref >60)
Glucose, Bld: 143 mg/dL — ABNORMAL HIGH (ref 70–99)
Potassium: 3.9 mmol/L (ref 3.5–5.1)
Sodium: 137 mmol/L (ref 135–145)

## 2022-01-13 LAB — CBC WITH DIFFERENTIAL/PLATELET
Abs Immature Granulocytes: 0.07 10*3/uL (ref 0.00–0.07)
Basophils Absolute: 0.1 10*3/uL (ref 0.0–0.1)
Basophils Relative: 1 %
Eosinophils Absolute: 0.4 10*3/uL (ref 0.0–0.5)
Eosinophils Relative: 4 %
HCT: 39 % (ref 39.0–52.0)
Hemoglobin: 13.2 g/dL (ref 13.0–17.0)
Immature Granulocytes: 1 %
Lymphocytes Relative: 20 %
Lymphs Abs: 2.2 10*3/uL (ref 0.7–4.0)
MCH: 30.3 pg (ref 26.0–34.0)
MCHC: 33.8 g/dL (ref 30.0–36.0)
MCV: 89.4 fL (ref 80.0–100.0)
Monocytes Absolute: 0.9 10*3/uL (ref 0.1–1.0)
Monocytes Relative: 8 %
Neutro Abs: 7.5 10*3/uL (ref 1.7–7.7)
Neutrophils Relative %: 66 %
Platelets: 522 10*3/uL — ABNORMAL HIGH (ref 150–400)
RBC: 4.36 MIL/uL (ref 4.22–5.81)
RDW: 12.9 % (ref 11.5–15.5)
WBC: 11.1 10*3/uL — ABNORMAL HIGH (ref 4.0–10.5)
nRBC: 0 % (ref 0.0–0.2)

## 2022-01-13 LAB — GLUCOSE, CAPILLARY
Glucose-Capillary: 155 mg/dL — ABNORMAL HIGH (ref 70–99)
Glucose-Capillary: 177 mg/dL — ABNORMAL HIGH (ref 70–99)
Glucose-Capillary: 151 mg/dL — ABNORMAL HIGH (ref 70–99)
Glucose-Capillary: 198 mg/dL — ABNORMAL HIGH (ref 70–99)

## 2022-01-13 MED ORDER — AMPHETAMINE-DEXTROAMPHETAMINE 10 MG PO TABS
10.0000 mg | ORAL_TABLET | Freq: Every day | ORAL | Status: DC
  Administered 2022-01-14 – 2022-01-18 (×5): 10 mg via ORAL
  Filled 2022-01-13 (×5): qty 1

## 2022-01-13 MED ORDER — LIDOCAINE 5 % EX PTCH: 1.0000 | MEDICATED_PATCH | Freq: Every day | CUTANEOUS | Status: DC | PRN

## 2022-01-13 NOTE — Progress Notes (Signed)
Speech Language Pathology Daily Session Note ? ?Patient Details  ?Name: Jordan [name] ?MRN: [id number] ?Date of Birth: Aug 14, 1941 ? ?Today's Date: 01/13/2022 ?SLP Individual Time: 1000-1100 ?SLP Individual Time Calculation (min): 60 min ? ?Short Term Goals: ?Week 1: SLP Short Term Goal 1 (Week 1): Patient will consume regular textures, thin liquids without overt s/s aspiration or penetration at mod I level supervision. ?SLP Short Term Goal 2 (Week 1): Patient will perform complex level problem solving tasks (money management, medication management) with supervision to mod I level supervision/cues. ?SLP Short Term Goal 3 (Week 1): Patient will recall and restate specific therapeutic and medical interventions, utilizing memory book as needed, with supervision level assistance. ? ?Skilled Therapeutic Interventions: ?Pt seen this date for skilled ST intervention targeting dysphagia management and cognitive-linguistic goals outlined above. Upon arrival, pt awake/alert and OOB in recliner chair. Agreeable to ST intervention. ? ?SLP facilitated today's session by providing skilled observation of regular textures and thin liquids with no clinical s/sx concerning for aspiration and Mod I level supervision; goal met. 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 Completed a semi-complex problem-solving task re: medication label with overall Min visual or verbal assistance to locate specific information on medication label. Reports that his wife typically completes this pill box and provides his medications to him. Of note, pt frequently tangential and required cues to remain on topic. Tolerated treatment well. ? ?At the end of today's session, pt left in room with chair alarm on, call bell reviewed and within reach, and all immediate needs met. Continue per current ST plan of care.  ? ?Pain ?Pt denies pain ? ?Therapy/Group: Individual  Therapy ? ?Ciaran Begay A Antrell Tipler ?01/13/2022, 1:31 PM ?

## 2022-01-13 NOTE — Patient Care Conference (Signed)
Inpatient RehabilitationTeam Conference and Plan of Care Update ?Date: 01/13/2022   Time: 11:34 AM  ? ? ?Patient Name: Jordan [name]      ?Medical Record Number: [id number]  ?Date of Birth: 04-Oct-1941 ?Sex: Male         ?Room/Bed: 5C08C/5C08C-01 ?Payor Info: Payor: Medical illustrator / Plan: [employer] MED[employer]ARE ADVANTAGE / Product Type: *No Product type* /   ? ?Admit Date/Time:  01/08/2022  2:44 PM ? ?Primary Diagnosis:  Debility ? ?Hospital Problems: Principal Problem: ?  Debility ? ? ? ?Expected Discharge Date: Expected Discharge Date: 01/16/22 ? ?Team Members Present: ?Physician leading conference: Dr. Leeroy Cha ?Social Worker Present: Erlene Quan, BSW ?Nurse Present: Dorien Chihuahua, RN ?PT Present: Barrie Folk, PT ?OT Present: Other (comment) [facility] Alphonsa Gin, [place]) ?SLP Present: Other (comment) Romelle Starcher SLP) ?PPS Coordinator present : Gunnar Fusi, SLP ? ?   Current Status/Progress Goal Weekly Team Focus  ?Bowel/Bladder ? ? Continent of bowel, incontinent of urine at times. LBM 3/15         ?Swallow/Nutrition/ Hydration ? ? mod I  mod I  assess tolerance of regular textures x1 prior to signing off. Appears to be tolerating thin liquids w/out s/s of aspiration   ?ADL's ? ? min A bathing, min A dressing, mod A toileting, CGA toilet transfers via ambulation with RW  supervision-min A  sequencing, iniation, d/c planning, safety awareness, ADL retraining   ?Mobility ? ? bed mobility supervision, transfers with RW CGA/min A, gait 6f with RW CGA  supervision  functional mobility/transfers, generalized strengthening and endurance, safety awareness, gait training, D/C planning   ?Communication ? ?           ?Safety/Cognition/ Behavioral Observations ? min A - pt reports change in memory  mod I  further assessment of complex problem solving, functional problem solving, recall with compensatory strategies   ?Pain ? ? prn medication         ?Skin ? ? no issues noted         ? ? ?Discharge Planning:   ?discharging home with assistance from spouse and autistic son. Has elevator in the home   ?Team Discussion: ?Patient with hypotension; MD adjusted meds.  ? ?Patient on target to meet rehab goals: ?yes, currently needs CGA for ambulation with a RW up to 59'. CGA for transfers and min assist for lower body care and toileting.  Goals for discharge set for supervision overall. ? ?*See Care Plan and progress notes for long and short-term goals.  ? ?Revisions to Treatment Plan:  ?N/A ?  ?Teaching Needs: ?Safety, medications, transfers and toileting, etc  ?Current Barriers to Discharge: ?Decreased caregiver support ? ?Possible Resolutions to Barriers: ?Family education with wife ?HH follow up services ?DME: TTB ?  ? ? Medical Summary ?Current Status: decreaed energy, orthostatic hypotension, obesity ? Barriers to Discharge: Medical stability;Weight ? Barriers to Discharge Comments: decreaed energy, orthostatic hypotension, obesity ?Possible Resolutions to [employer] restart adderal tomorrow morning, use teds and abdominal binder with therapy, discontinue hydralazine, provide dietary education ? ? ?Continued Need for Acute Rehabilitation Level of Care: The patient requires daily medical management by a physician with specialized training in physical medicine and rehabilitation for the following reasons: ?Direction of a multidisciplinary physical rehabilitation program to maximize functional independence : Yes ?Medical management of patient stability for increased activity during participation in an intensive rehabilitation regime.: Yes ?Analysis of laboratory values and/or radiology reports with any subsequent need for medication adjustment and/or medical intervention. : Yes ? ? ?  I attest that I was present, lead the team conference, and concur with the assessment and plan of the team. ? ? ?Dorien Chihuahua B ?01/13/2022, 4:58 PM  ? ? ? ? ? ? ?

## 2022-01-13 NOTE — Progress Notes (Signed)
Physical Therapy Session Note ? ?Patient Details  ?Name: Jordan [name] ?MRN: [id number] ?Date of Birth: 03/04/1941 ? ?Today's Date: 01/14/2022 ?PT Individual Time: 4098 - 0920 ?  PT Individual Time Calculation (min): 65 min  ?And ?Today's Date: 01/13/2022 ?PT Missed Time: 10 Minutes ?Missed Time Reason: Other(Comment) Meal ? ?Short Term Goals: ?Week 1:  PT Short Term Goal 1 (Week 1): STG = LTG 2/2 LOS ? ?Skilled Therapeutic Interventions/Progress Updates:  ?  Pt received supine in bed and appearing agreeable to therapy session. Pt eating breakfast but reports need to urinate and requests to use urinal - provided to pt and he was continent without assist. Pt appears somewhat lethargic at beginning of session and also appears to have some decreased awareness of items asking where things are located on his tray (asking where his coffee was). Pt requests to finish eating breakfast prior to participating in OOB mobility. Therapist assisted pt with repositioning in bed for increased independence and then left patient for 10 minutes for him to finish his meal. Missed 10 minutes of skilled physical therapy. ? ?Therapist returned and pt finished with meal and ready to participate in session. Supine>sitting R EOB, HOB elevated and bedrail available, with supervision and increased time/effort. Sitting EOB donned thigh high TED hose, clean brief, shorts, and shoes with max assist for time management. Provided pt with clean wash cloth to perform hygiene in underarms and peri-area. Doffed dirty shirt and donned clean shirt with set-up assist.  ? ?Sit<>stand EOB<>RW with heavy min assist for lifting to stand and requires consistent min assist for balance while in standing due to consistent posterior lean with decreased B LE hip/knee extension and anterior weight shift (stays in a partial squat stance). Seated break then stood again with only slight improvement in above deviations.  Standing with heavy min assist of 1 using B UE  support on RW with total assist to pull pants up over hips.  ? ?R stand pivot EOB>recliner using RW with heavy min assist for balance and AD management - pt continuing to maintain a partial squat posture, lacking ability to maintain B LE extension as well as continued minor posterior lean during. ? ?Sitting: BP 130/71 (MAP 90), HR 75bpm  ?Standing: BP 83/60 (MAP 65), HR 79bpm - pt reports some "dizziness" but not significant ?After 2 minute seated rest break: BP 116/71 (MAP 84), HR 68bpm  ? ?MD present and made aware of the orthostatic hypotension and she is planning to order an abdominal binder - MD states for pt to wear thigh high TED hose and abdominal binder just with therapy (not while resting). ? ?At end of session, pt left seated in recliner with needs in reach and chair pad alarm on. ? ?Therapy Documentation ?Precautions:  ?Precautions ?Precautions: Fall, Back ?Precaution Comments: L2 kyphoplasty 12/29/21 ?Restrictions ?Weight Bearing Restrictions: No ? ? ?Pain: ? Denies pain during session. ? ? ?Therapy/Group: Individual Therapy ? ?Tawana Scale , PT, DPT, NCS, CSRS ? ?01/13/2022, 7:56 AM  ?

## 2022-01-13 NOTE — Progress Notes (Signed)
Orthopedic Tech Progress Note ?Patient Details:  ?Jordan [name] ?1941/07/21 ?[id number] ? ?RN called requesting a MEDIUM ABDOMINAL BINDER, handed it to OT who was in room at the time  ? ?Ortho Devices ?Type of Ortho Device: Abdominal binder ?Ortho Device/Splint Location: STOMACH ?Ortho Device/Splint Interventions: Ordered ?  ?Post Interventions ?Patient Tolerated: Well ?Instructions Provided: Care of device ? ?Janit Pagan ?01/13/2022, 1:45 PM ? ?

## 2022-01-13 NOTE — Progress Notes (Signed)
?                                                       PROGRESS NOTE ? ? ?Subjective/Complaints: ?Orthostatic hypotension during therapy today. Discontinued hydralazine, ordered teds and abdominal binder for therapy only. Denies dysuria, dyspnea ? ? ?ROS:  ? ?Pt denies SOB, abd pain, CP, N/V/C/D, and vision changes, dysuria ?+back pain ? ? ?Objective: ?  ?No results found. ?Recent Labs  ?  01/11/22 ?0810  ?WBC 12.7*  ?HGB 12.4*  ?HCT 37.6*  ?PLT 434*  ? ?Recent Labs  ?  01/11/22 ?0810  ?NA 137  ?K 4.2  ?CL 103  ?CO2 24  ?GLUCOSE 204*  ?BUN 24*  ?CREATININE 0.78  ?CALCIUM 9.3  ? ? ?Intake/Output Summary (Last 24 hours) at 01/13/2022 1204 ?Last data filed at 01/13/2022 0859 ?Gross per 24 hour  ?Intake 900 ml  ?Output 500 ml  ?Net 400 ml  ?  ? ?  ? ?Physical Exam: ?Vital Signs ?Blood pressure 113/67, pulse 64, temperature (!) 97.5 ?F (36.4 ?C), resp. rate 17, height '5\' 8"'$  (1.727 m), weight 98.4 kg, SpO2 94 %. ?General: awake, alert, appropriate, sleepy - "doesn't want to be disturbed". NAD, BMI 32.92 ?HENT: conjugate gaze; oropharynx moist ?CV: regular rate; no JVD, +orthostasis ?Pulmonary: CTA B/L; no W/R/R- good air movement ?GI: soft, NT, ND, (+)BS ?Psychiatric: appropriate- irritable ?Neurological: alert, but sleepy ? ?Neurological:  ?   Mental Status: He is alert.  ?   Comments: Oriented to self, place and date--situation "falls". Able to follow simple motor commands without difficulty. Slow speech ? ? ?Assessment/Plan: ?1. Functional deficits which require 3+ hours per day of interdisciplinary therapy in a comprehensive inpatient rehab setting. ?Physiatrist is providing close team supervision and 24 hour management of active medical problems listed below. ?Physiatrist and rehab team continue to assess barriers to discharge/monitor patient progress toward functional and medical goals ? ?Care Tool: ? ?Bathing ?   ?Body parts bathed by patient: Right arm, Left arm, Chest, Abdomen, Right upper leg, Left upper leg,  Face, Right lower leg, Left lower leg  ? Body parts bathed by helper: Buttocks, Right lower leg, Left lower leg ?  ?  ?Bathing assist Assist Level: Minimal Assistance - Patient > 75% ?  ?  ?Upper Body Dressing/Undressing ?Upper body dressing   ?What is the patient wearing?: Pull over shirt ?   ?Upper body assist Assist Level: Supervision/Verbal cueing ?   ?Lower Body Dressing/Undressing ?Lower body dressing ? ? ?   ?What is the patient wearing?: Pants ? ?  ? ?Lower body assist Assist for lower body dressing: Contact Guard/Touching assist ?   ? ?Toileting ?Toileting    ?Toileting assist Assist for toileting: Total Assistance - Patient < 25% ?  ?  ?Transfers ?Chair/bed transfer ? ?Transfers assist ?   ? ?Chair/bed transfer assist level: Minimal Assistance - Patient > 75% ?  ?  ?Locomotion ?Ambulation ? ? ?Ambulation assist ? ? Ambulation activity did not occur: Safety/medical concerns ? ?Assist level: Minimal Assistance - Patient > 75% ?Assistive device: Walker-rolling ?Max distance: 68f  ? ?Walk 10 feet activity ? ? ?Assist ?   ? ?Assist level: Minimal Assistance - Patient > 75% ?Assistive device: Walker-rolling  ? ?Walk 50 feet activity ? ? ?Assist   ? ?Assist level: Minimal  Assistance - Patient > 75% ?Assistive device: Walker-rolling  ? ? ?Walk 150 feet activity ? ? ?Assist Walk 150 feet activity did not occur: Safety/medical concerns ? ?  ?  ?  ? ?Walk 10 feet on uneven surface  ?activity ? ? ?Assist Walk 10 feet on uneven surfaces activity did not occur: Safety/medical concerns ? ? ?  ?   ? ?Wheelchair ? ? ? ? ?Assist   ?  ?Wheelchair activity did not occur: Safety/medical concerns ? ?  ?   ? ? ?Wheelchair 50 feet with 2 turns activity ? ? ? ?Assist ? ?  ?Wheelchair 50 feet with 2 turns activity did not occur: Safety/medical concerns ? ? ?   ? ?Wheelchair 150 feet activity  ? ? ? ?Assist ? Wheelchair 150 feet activity did not occur: Safety/medical concerns ? ? ?   ? ?Blood pressure 113/67, pulse 64, temperature  (!) 97.5 ?F (36.4 ?C), resp. rate 17, height '5\' 8"'$  (1.727 m), weight 98.4 kg, SpO2 94 %. ? ?Medical Problem List and Plan: ?1. Debility secondary to pancreatitis.  ?            -patient may shower ?            -ELOS/Goals: 8 days S ?            -con't CIR- PT, OT ? Messaged April to schedule HFU ? -Interdisciplinary Team Conference today   ?2.  Antithrombotics: ?-DVT/anticoagulation:  Pharmaceutical: Lovenox ?            -antiplatelet therapy: N/A ?3. Low back pain: add kpad, order lidocaine patch prn ?4. Agitation at night: continue seroquel 12.'5mg'$  HS ?5. Neuropsych: This patient is not capable of making decisions on his own behalf. ?6. Skin/Wound Care: Routine pressure relief measures.  ?7. Fluids/Electrolytes/Nutrition: Monitor I/O. Check CMET in am ?8. Pancreatitis: NO GI symptoms--has resolved.   ?9. Diverticulitis: Continue Augmentin. On Miralax and senna for bowel regimen.  ?10 Parkinson's disease: Recent admission for hallucinations. Discussed deep brain stimulation.  ?            --continue Sinemet IR TID with Sinemet ER at bedtime.  ?11. CAD?Chronic diastolic CHF: Monitor for symtoms with increase in activity.  ?--Add low salt restrictions.  ?--Likely exacerbated by low protein stores. Add juven.  ?--Monitor daily weights and for signs of overload ?            --Transitioned to oral Lasix 03/09.  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 T2DM: BS poorly controlled likely due to supplements TID BETWEEN meals ?--d/c prosource and Ensure Max ?14. Leucocytosis: Continues to improve. Monitor for fevers and other signs of infection. ? 3/12- will double check again Monday ? 3/13- WBC down slightly to 12.7 from 13.1- con't to monitor ?10. Constipation: Continue Senna with mirlax. Resume probiotic. Add magnesium '250mg'$  HS ?11. Suboptimal vitamin D: start ergocalciferol 50,000 U once per week. ?12. Orthostatic hyptension: TED/abdominal binder ordered. D/c hydralazine.  ?13. Increased confusion: check CBC/UA  today ? ? ? ?LOS: ?5 days ?A FACE TO FACE EVALUATION WAS PERFORMED ? ?Martha Clan P Brayn Eckstein ?01/13/2022, 12:04 PM  ? ?  ?

## 2022-01-13 NOTE — Progress Notes (Signed)
Patient ID: Jordan [name], male   DOB: September 23, 1941, 81 y.o.   MRN: [id number] ? ?Team Conference Report to Patient/Family ? ?Team Conference discussion was reviewed with the patient and caregiver, including goals, any changes in plan of care and target discharge date.  Patient and caregiver express understanding and are in agreement.  The patient has a target discharge date of 01/16/22. ? ?SW met with patient and spouse. Provided conference updates. Patient at bedside getting IV removed. Cath d/c soon. Patient spouse prefers OP at [facility]. No additional questions or concerns. ? ?Dyanne Iha ?01/13/2022, 2:05 PM  ?

## 2022-01-13 NOTE — Progress Notes (Signed)
Occupational Therapy Session Note ? ?Patient Details  ?Name: Jordan [name] ?MRN: [id number] ?Date of Birth: Mar 25, 1941 ? ?Today's Date: 01/13/2022 ?OT Individual Time: 6073-7106 ?OT Individual Time Calculation (min): 72 min  ? ? ?Short Term Goals: ?Week 1:  OT Short Term Goal 1 (Week 1): STGs = LTGs ? ?Skilled Therapeutic Interventions/Progress Updates:  ?Skilled OT intervention completed with focus on family education, d/c planning, functional transfers. Pt received upright in bed, with pt's wife present. Both pt and his wife with extensive questions and concerns regarding pt's d/c date.  ? ?Therapist provided education on the following: ?DME recommended- TTB for tub/shower or sponge bathing due to home walk in shower being less accessible ?How to manage shower curtain to prevent water spillage ?Pt's CLOF, and min A level provided for self-care with CGA needed for transfers ?BP management with purpose of abdominal binder/TEDS ?Bed mobility for home, with use of applying bed rail to their adjustable bed for ease of getting in/out ?Parkinsons education, as a progressive condition with focus on modifications and adaptations  ?F/u recommendations for therapy ?RW positioning, close to hips vs leaning on BUE ? ?Completed bed mobility with bed flat and min A HHA as pt could not come up without. Sitting EOB, therapist donned thigh high TEDS and abdominal binder with total A, with pt asymptomatic. Pt completed sit > stand using RW with supervision, then stand pivot with RW to recliner with CGA and cues needed to prevent pre-sitting. Therapist provided education to pt's wife about the assist level with wife stating "I think he's pretending and putting on a show with you." Discussed with pt about importance of listening to cues/commands from the "helper" even if its his wife. Pt was non-compliant with wearing belt alarm, but wife agreeable to stay present, and alert nursing to apply belt or assist him back to bed when she  leaves. Pt left with BLE elevated, in recliner, with all needs in reach and wife in room at departure. ? ? ?Therapy Documentation ?Precautions:  ?Precautions ?Precautions: Fall, Back ?Precaution Comments: L2 kyphoplasty 12/29/21 ?Restrictions ?Weight Bearing Restrictions: No ? ?Pain: ?No c/o pain ? ? ?Therapy/Group: Individual Therapy ? ?Lytle ?01/13/2022, 7:48 AM ?

## 2022-01-13 NOTE — Discharge Instructions (Addendum)
Inpatient Rehab Discharge Instructions ? ?[name] ?Discharge date and time: 01/13/22   ? ?Activities/Precautions/ Functional Status: ?Activity: no lifting, driving, or strenuous exercise for till cleared by MD ?Diet: diabetic diet Low salt/low fat ?Wound Care: none needed ? ? ?Functional status:  ?___ No restrictions     ___ Walk up steps independently ?_X__ 24/7 supervision/assistance   ___ Walk up steps with assistance ?___ Intermittent supervision/assistance  ___ Bathe/dress independently ?___ Walk with walker     ___ Bathe/dress with assistance ?___ Walk Independently    ___ Shower independently ?_X__ Walk with assistance    __X_ Shower with assistance ?___ No alcohol     ___ Return to work/school ________ ? ?Special Instructions: ? Avoid lying  in bed during the day. Increase fluid intake.  ?Wear support stockings and binder when out of bed. Remove/ loosen binder when seated.  ?3.  Drink fluids thorough out the day. ?4.  Check weight today and daily. Contact cardiology if weight goes up by 2 lbs over night or 3- 5 lbs in 2 days,  ?5. Monitor blood sugars 2-4 times a day and follow up with PCP for input on blood sugars.  ? 6. Resume plavix.  ? ?My questions have been answered and I understand these instructions. I will adhere to these goals and the provided educational materials after my discharge from the hospital. ? ?Patient/Caregiver Signature _______________________________ Date __________ ? ?Clinician Signature _______________________________________ Date __________ ? ?Please bring this form and your medication list with you to all your follow-up doctor's appointments.   ?

## 2022-01-14 LAB — URINALYSIS, ROUTINE W REFLEX MICROSCOPIC
Bilirubin Urine: NEGATIVE
Glucose, UA: NEGATIVE mg/dL
Ketones, ur: NEGATIVE mg/dL
Leukocytes,Ua: NEGATIVE
Nitrite: NEGATIVE
Protein, ur: NEGATIVE mg/dL
RBC / HPF: >50 RBC/hpf — ABNORMAL HIGH (ref 0–5)
Specific Gravity, Urine: 1.013 (ref 1.005–1.030)
pH: 5 (ref 5.0–8.0)

## 2022-01-14 LAB — GLUCOSE, CAPILLARY
Glucose-Capillary: 138 mg/dL — ABNORMAL HIGH (ref 70–99)
Glucose-Capillary: 179 mg/dL — ABNORMAL HIGH (ref 70–99)
Glucose-Capillary: 141 mg/dL — ABNORMAL HIGH (ref 70–99)
Glucose-Capillary: 208 mg/dL — ABNORMAL HIGH (ref 70–99)

## 2022-01-14 NOTE — Progress Notes (Signed)
Occupational Therapy Session Note ? ?Patient Details  ?Name: DELSHAWN STECH ?MRN: [id number] ?Date of Birth: 19-Jun-1941 ? ?Today's Date: 01/14/2022 ?OT Individual Time: 1035-1140 ?OT Individual Time Calculation (min): 65 min  ? ? ?Short Term Goals: ?Week 1:  OT Short Term Goal 1 (Week 1): STGs = LTGs ?Week 2:    ? ?Skilled Therapeutic Interventions/Progress Updates:  ?Patient in bed upon arrival resting in supine,patient transfer from supine to EOB with MinA for managing his legs.The pt was able to transfer from EOB to w/c using the RW with MinA .  The pt was able to use his feet to maneuver to the sink area with vc's for effective carryover.  The pt was able to shave MinA and additional time , he was able to wash his face and brush his teeth with s/u assist. .  He was able to wash his UB with s/u and ModA for washing his LB the pt was able to donn his shirt with s/u assist and Mod/Max A for LB dress  incorporating the reacher for greater ease.  The pt required MaxA for donning his binder as well .  The pt transferred to the recliner with MinA using the RW with his alarm in place, the call bell and bedside table placed within reach.  The pt had no c/o of pain this treatment session. ? ?Therapy Documentation ?Precautions:  ?Precautions ?Precautions: Fall, Back ?Precaution Comments: L2 kyphoplasty 12/29/21 ?Restrictions ?Weight Bearing Restrictions: No ? ?Therapy/Group: Individual Therapy ? ?Yvonne Kendall ?01/14/2022, 12:37 PM ?

## 2022-01-14 NOTE — Progress Notes (Signed)
Speech Language Pathology Daily Session Note ? ?Patient Details  ?Name: Jordan [name] ?MRN: [id number] ?Date of Birth: April 09, 1941 ? ?Today's Date: 01/14/2022 ?SLP Individual Time: 3736-6815 ?SLP Individual Time Calculation (min): 45 min ? ?Short Term Goals: ?Week 1: SLP Short Term Goal 1 (Week 1): Patient will consume regular textures, thin liquids without overt s/s aspiration or penetration at mod I level supervision. ?SLP Short Term Goal 2 (Week 1): Patient will perform complex level problem solving tasks (money management, medication management) with supervision to mod I level supervision/cues. ?SLP Short Term Goal 3 (Week 1): Patient will recall and restate specific therapeutic and medical interventions, utilizing memory book as needed, with supervision level assistance. ? ?Skilled Therapeutic Interventions: ?Pt seen this date for skilled ST intervention targeting aforementioned cognitive-linguistic goals. Upon SLP arrival, pt lying in bed, asleep with CPAP mask on. Aroused with gentle tactile and verbal cues. Agreeable to ST intervention at the bedside.  ? ?SLP facilitated today's session by providing re-education re: fall precautions and rationale for obtaining assistance prior to ambulating secondary to pt verbalizing frustration with having to wait on staff for mobility. Following explanation/rationale for calling, pt verbalized understanding and appeared aware of events that may happen if he were to fall, though did not appear overtly concerned with the consequence that have result. Pt was noted to independently utilize signage in his room to remind himself to "call, not fall." Spoke with pt's RN who reports pt has been utilizing call bell appropriately and does not get up without assistance during the day. During functional problem-solving task, pt successfully verbalized sequence for bed to wc transfer with supervision question prompts.  ? ?At the end of today's session, pt transferred from bed to recliner  chair with Min A from SLP. Pt left in recliner chair with chair alarm donned, call bell within reach, and all immediate needs met. Continue to recommend skilled ST intervention. ? ?Pain ?Denies pain ? ?Therapy/Group: Individual Therapy ? ?Deland Slocumb A Kaylana Fenstermacher ?01/14/2022, 12:45 PM ?

## 2022-01-14 NOTE — Progress Notes (Signed)
Physical Therapy Session Note ? ?Patient Details  ?Name: Jordan [name] ?MRN: [id number] ?Date of Birth: August 19, 1941 ? ?Today's Date: 01/14/2022 ?PT Individual Time: 0865-7846 and 9629-5284 ?PT Individual Time Calculation (min): 40 min and 56 min ? ?Short Term Goals: ?Week 1:  PT Short Term Goal 1 (Week 1): STG = LTG 2/2 LOS ? ?Skilled Therapeutic Interventions/Progress Updates:  ? Treatment Session 1 ?Received pt sitting in recliner, pt agreeable to PT treatment, and denied any pain during session. Session with emphasis on functional mobility/transfers, generalized strengthening and endurance, BP management, and toileting. Pt expressed c/o regarding D/C this Saturday and feeling like he's not ready - reached out to treatment team to further discuss; D/C date extended until 3/20. Pt also reporting frustration with bed/chair alarms. Explained again, safety concerns and fall risk and relation to pt being here in rehab to maximize independence. BP sitting: 116/67 (no teds or abdominal binder) and pt asymptomatic. Pt required x 2 attempts to obtain standing BP as pt initially unable to tolerate standing long enough to get reading due to fatigue - noticed increased retropulsion with increased time standing. BP standing: 104/64 (no teds or abdominal binder) and pt asymptomatic, just reporting fatigue from standing. Pt then performed the following exercises standing with BUE support on RW and CGA/min A for balance: ?-alternating marches x12 bilaterally - pt stepping on top of his feet and with posterior LOB requiring min A to correct. ?-squats x12 ?Pt requested to use restroom. Sit<>stand with RW and CGA and ambulated 60f with RW and CGA to bathroom - min A for turning to sit on commode and pt able to remove brief/pants with CGA. Pt left in care of NT due to time restrictions.  ? ?Treatment Session 2 ?Received pt supine in bed asleep. Upon wakening pt agreeable to PT treatment and denied any pain during session. Session with  emphasis on functional mobility/transfers, generalized strengthening and endurance, dynamic standing balance/coordination, and gait training. Pt transferred supine<>sitting EOB with HOB elevated and use of bedrails with supervision and donned shoes with max A. Stand<>pivot bed<>WC with RW and CGA and [facility]. Sit<>stand with RW and CGA and ambulated 930fx 2 trials with RW and CGA. Pt then performed BUE/LE strengthening on Nustep at workload 4 for 14 minutes (per pt request) for a total of 825 steps with emphasis on cardiovascular endurance, reciprocal movement training, and big amplitude movements - cues for attention to task as pt very hyperverbal. Provided pt with HEP and educated on frequency/duration/technique for the following exercises and importance of having wife with him when performing standing exercises:  ?-Standing March with Counter Support - 1 x daily - 7 x weekly - 3 sets - 10 reps ?-Mini Squat with Counter Support - 1 x daily - 7 x weekly - 3 sets - 10 reps ?-Standing Hip Abduction with Counter Support - 1 x daily - 7 x weekly - 3 sets - 10 reps ?-Heel Raises with Counter Support - 1 x daily - 7 x weekly - 3 sets - 10 reps ?-Seated Long Arc Quad - 1 x daily - 7 x weekly - 3 sets - 10 reps ?-Seated Hip Flexion - 1 x daily - 7 x weekly - 3 sets - 10 reps ?Stand<>pivot Nustep<>WC with RW and CGA and pt requested to return to bed. WC<>bed with RW and CGA and sit<>supine with supervision. Concluded session with pt supine in bed, needs within reach, and  bed alarm on.  ? ?Therapy Documentation ?Precautions:  ?Precautions ?Precautions: Fall, Back ?Precaution Comments: L2 kyphoplasty 12/29/21 ?Restrictions ?Weight Bearing Restrictions: No ? ?Therapy/Group: Individual Therapy ?Blenda Nicely ?Becky Sax PT, DPT  ?01/14/2022, 7:25 AM  ?

## 2022-01-14 NOTE — Progress Notes (Signed)
?                                                       PROGRESS NOTE ? ? ?Subjective/Complaints: ?Extending until Monday as per wife's request to help advance function further- discussed with therapists and they are in agreement.  ?Abdominal binder delivered.  ? ? ?ROS:  ? ?Pt denies SOB, abd pain, CP, N/V/C/D, and vision changes, dysuria ?+back pain, +orthostasis ? ? ?Objective: ?  ?No results found. ?Recent Labs  ?  01/13/22 ?1253  ?WBC 11.1*  ?HGB 13.2  ?HCT 39.0  ?PLT 522*  ? ?Recent Labs  ?  01/13/22 ?1253  ?NA 137  ?K 3.9  ?CL 103  ?CO2 22  ?GLUCOSE 143*  ?BUN 26*  ?CREATININE 0.77  ?CALCIUM 9.7  ? ? ?Intake/Output Summary (Last 24 hours) at 01/14/2022 1132 ?Last data filed at 01/14/2022 [phone] ?Gross per 24 hour  ?Intake 900 ml  ?Output 400 ml  ?Net 500 ml  ?  ? ?  ? ?Physical Exam: ?Vital Signs ?Blood pressure 121/73, pulse 66, temperature 97.7 ?F (36.5 ?C), temperature source Oral, resp. rate 17, height '5\' 8"'$  (1.727 m), weight 98.4 kg, SpO2 94 %. ?General: awake, alert, appropriate, sleepy - "doesn't want to be disturbed". NAD, BMI 32.92 ?HENT: conjugate gaze; oropharynx moist ?CV: regular rate; no JVD, +orthostasis ?Pulmonary: CTA B/L; no W/R/R- good air movement ?GI: soft, NT, ND, (+)BS, abdominal binder during therapy ?Psychiatric: appropriate- irritable ?Neurological: alert, but sleepy ? ?Neurological:  ?   Mental Status: He is alert.  ?   Comments: Oriented to self, place and date--situation "falls". Able to follow simple motor commands without difficulty. Slow speech ? ? ?Assessment/Plan: ?1. Functional deficits which require 3+ hours per day of interdisciplinary therapy in a comprehensive inpatient rehab setting. ?Physiatrist is providing close team supervision and 24 hour management of active medical problems listed below. ?Physiatrist and rehab team continue to assess barriers to discharge/monitor patient progress toward functional and medical goals ? ?Care Tool: ? ?Bathing ?   ?Body parts bathed by  patient: Right arm, Left arm, Chest, Abdomen, Right upper leg, Left upper leg, Face, Right lower leg, Left lower leg  ? Body parts bathed by helper: Buttocks, Right lower leg, Left lower leg ?  ?  ?Bathing assist Assist Level: Minimal Assistance - Patient > 75% ?  ?  ?Upper Body Dressing/Undressing ?Upper body dressing   ?What is the patient wearing?: Pull over shirt ?   ?Upper body assist Assist Level: Supervision/Verbal cueing ?   ?Lower Body Dressing/Undressing ?Lower body dressing ? ? ?   ?What is the patient wearing?: Pants ? ?  ? ?Lower body assist Assist for lower body dressing: Contact Guard/Touching assist ?   ? ?Toileting ?Toileting    ?Toileting assist Assist for toileting: Total Assistance - Patient < 25% ?  ?  ?Transfers ?Chair/bed transfer ? ?Transfers assist ?   ? ?Chair/bed transfer assist level: Contact Guard/Touching assist ?  ?  ?Locomotion ?Ambulation ? ? ?Ambulation assist ? ? Ambulation activity did not occur: Safety/medical concerns ? ?Assist level: Minimal Assistance - Patient > 75% ?Assistive device: Walker-rolling ?Max distance: 67f  ? ?Walk 10 feet activity ? ? ?Assist ?   ? ?Assist level: Minimal Assistance - Patient > 75% ?Assistive device: Walker-rolling  ? ?Walk 50  feet activity ? ? ?Assist   ? ?Assist level: Minimal Assistance - Patient > 75% ?Assistive device: Walker-rolling  ? ? ?Walk 150 feet activity ? ? ?Assist Walk 150 feet activity did not occur: Safety/medical concerns ? ?  ?  ?  ? ?Walk 10 feet on uneven surface  ?activity ? ? ?Assist Walk 10 feet on uneven surfaces activity did not occur: Safety/medical concerns ? ? ?  ?   ? ?Wheelchair ? ? ? ? ?Assist   ?  ?Wheelchair activity did not occur: Safety/medical concerns ? ?  ?   ? ? ?Wheelchair 50 feet with 2 turns activity ? ? ? ?Assist ? ?  ?Wheelchair 50 feet with 2 turns activity did not occur: Safety/medical concerns ? ? ?   ? ?Wheelchair 150 feet activity  ? ? ? ?Assist ? Wheelchair 150 feet activity did not occur:  Safety/medical concerns ? ? ?   ? ?Blood pressure 121/73, pulse 66, temperature 97.7 ?F (36.5 ?C), temperature source Oral, resp. rate 17, height '5\' 8"'$  (1.727 m), weight 98.4 kg, SpO2 94 %. ? ?Medical Problem List and Plan: ?1. Debility secondary to pancreatitis.  ?            -patient may shower ?            -ELOS/Goals: 10 days S ?            -Continue CIR- PT, OT ? HFU scheduled ?2.  Impaired mobility: continue Lovenox while hospitalized.  ?3. Low back pain: add kpad, order lidocaine patch prn, continue tylenol prn.  ?4. Agitation at night: continue seroquel 12.'5mg'$  HS ?5. Neuropsych: This patient is not capable of making decisions on his own behalf. ?6. Skin/Wound Care: Routine pressure relief measures.  ?7. Fluids/Electrolytes/Nutrition: Monitor I/O. Check CMET in am ?8. Pancreatitis: NO GI symptoms--has resolved.   ?9. Diverticulitis: Continue Augmentin. On Miralax and senna for bowel regimen.  ?10 Parkinson's disease: Recent admission for hallucinations. Discussed deep brain stimulation.  ?            --continue Sinemet IR TID with Sinemet ER at bedtime.  ?11. CAD?Chronic diastolic CHF: Monitor for symtoms with increase in activity.  ?--Add low salt restrictions.  ?--Likely exacerbated by low protein stores. Add juven.  ?--Monitor daily weights and for signs of overload ?            --Transitioned to oral Lasix 03/09.  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 T2DM: BS poorly controlled likely due to supplements TID BETWEEN meals ?--d/c prosource and Ensure Max ?14. Leucocytosis: Continues to improve. Monitor for fevers and other signs of infection. ? 3/12- will double check again Monday ? 3/13- WBC down slightly to 12.7 from 13.1- con't to monitor ?10. Constipation: Continue Senna with mirlax. Resume probiotic. Add magnesium '250mg'$  HS ?11. Suboptimal vitamin D: start ergocalciferol 50,000 U once per week. ?12. Orthostatic hyptension: TED/abdominal binder ordered. D/c hydralazine.  ?13. Increased confusion:  leukocytosis improving. UA not suggestive of infection ? ? ? ?LOS: ?6 days ?A FACE TO FACE EVALUATION WAS PERFORMED ? ?Martha Clan P Addylynn Balin ?01/14/2022, 11:32 AM  ? ?  ?

## 2022-01-15 LAB — GLUCOSE, CAPILLARY
Glucose-Capillary: 171 mg/dL — ABNORMAL HIGH (ref 70–99)
Glucose-Capillary: 144 mg/dL — ABNORMAL HIGH (ref 70–99)
Glucose-Capillary: 161 mg/dL — ABNORMAL HIGH (ref 70–99)
Glucose-Capillary: 176 mg/dL — ABNORMAL HIGH (ref 70–99)

## 2022-01-15 MED ORDER — AMANTADINE HCL 100 MG PO CAPS
100.0000 mg | ORAL_CAPSULE | Freq: Every day | ORAL | Status: DC
  Administered 2022-01-16 – 2022-01-18 (×3): 100 mg via ORAL
  Filled 2022-01-15 (×3): qty 1

## 2022-01-15 MED ORDER — FUROSEMIDE 20 MG PO TABS
10.0000 mg | ORAL_TABLET | Freq: Once | ORAL | Status: AC
  Administered 2022-01-15: 10 mg via ORAL
  Filled 2022-01-15: qty 1

## 2022-01-15 NOTE — Progress Notes (Signed)
[facility]) Hospital Liaison note: ?  ?New request for [facility] Palliative Care services. Will continue to follow for disposition. ?  ?Please call with any outpatient palliative questions or concerns. ?  ?Thank you for the opportunity to participate in this patient's care. ?  ?Thank you, ?Lorelee Market, LPN ?[facility] Hospital Liaison ?[phone] ?

## 2022-01-15 NOTE — Progress Notes (Addendum)
?                                                       PROGRESS NOTE ? ? ?Subjective/Complaints: ?Wife asks SLP about his d/c date- discussed he was extended until Monday ?Blood pressure excellent ?Pressure holding up well in therapy without abdominal binder or TEDs ? ?ROS:  ? ?Pt denies SOB, abd pain, CP, N/V/C/D, and vision changes, dysuria ?+back pain, +orthostasis ? ? ?Objective: ?  ?No results found. ?Recent Labs  ?  01/13/22 ?1253  ?WBC 11.1*  ?HGB 13.2  ?HCT 39.0  ?PLT 522*  ? ?Recent Labs  ?  01/13/22 ?1253  ?NA 137  ?K 3.9  ?CL 103  ?CO2 22  ?GLUCOSE 143*  ?BUN 26*  ?CREATININE 0.77  ?CALCIUM 9.7  ? ? ?Intake/Output Summary (Last 24 hours) at 01/15/2022 1111 ?Last data filed at 01/15/2022 5456 ?Gross per 24 hour  ?Intake 540 ml  ?Output 1050 ml  ?Net -510 ml  ?  ? ?  ? ?Physical Exam: ?Vital Signs ?Blood pressure 107/66, pulse 65, temperature (!) 97.5 ?F (36.4 ?C), temperature source Oral, resp. rate 18, height '5\' 8"'$  (1.727 m), weight 98.6 kg, SpO2 92 %. ?General: awake, alert, appropriate, sleepy - "doesn't want to be disturbed". NAD, BMI 33.05 ?HENT: conjugate gaze; oropharynx moist ?CV: regular rate; no JVD, +orthostasis ?Pulmonary: CTA B/L; no W/R/R- good air movement ?GI: soft, NT, ND, (+)BS, abdominal binder during therapy ?Psychiatric: appropriate- irritable ?Neurological: alert, but sleepy ? ?Neurological:  ?   Mental Status: He is alert.  ?   Comments: Oriented to self, place and date--situation "falls". Able to follow simple motor commands without difficulty. Slow speech ?MSK: ambulating 180 feet with RW CG ? ? ?Assessment/Plan: ?1. Functional deficits which require 3+ hours per day of interdisciplinary therapy in a comprehensive inpatient rehab setting. ?Physiatrist is providing close team supervision and 24 hour management of active medical problems listed below. ?Physiatrist and rehab team continue to assess barriers to discharge/monitor patient progress toward functional and medical  goals ? ?Care Tool: ? ?Bathing ?   ?Body parts bathed by patient: Right arm, Left arm, Chest, Abdomen, Right upper leg, Left upper leg, Face, Right lower leg, Left lower leg  ? Body parts bathed by helper: Buttocks, Right lower leg, Left lower leg ?  ?  ?Bathing assist Assist Level: Minimal Assistance - Patient > 75% ?  ?  ?Upper Body Dressing/Undressing ?Upper body dressing   ?What is the patient wearing?: Pull over shirt ?   ?Upper body assist Assist Level: Supervision/Verbal cueing ?   ?Lower Body Dressing/Undressing ?Lower body dressing ? ? ?   ?What is the patient wearing?: Pants ? ?  ? ?Lower body assist Assist for lower body dressing: Contact Guard/Touching assist ?   ? ?Toileting ?Toileting    ?Toileting assist Assist for toileting: Total Assistance - Patient < 25% ?  ?  ?Transfers ?Chair/bed transfer ? ?Transfers assist ?   ? ?Chair/bed transfer assist level: Contact Guard/Touching assist ?  ?  ?Locomotion ?Ambulation ? ? ?Ambulation assist ? ? Ambulation activity did not occur: Safety/medical concerns ? ?Assist level: Contact Guard/Touching assist ?Assistive device: Walker-rolling ?Max distance: 111f  ? ?Walk 10 feet activity ? ? ?Assist ?   ? ?Assist level: Contact Guard/Touching assist ?Assistive device: Walker-rolling  ? ?  Walk 50 feet activity ? ? ?Assist   ? ?Assist level: Contact Guard/Touching assist ?Assistive device: Walker-rolling  ? ? ?Walk 150 feet activity ? ? ?Assist Walk 150 feet activity did not occur: Safety/medical concerns ? ?  ?  ?  ? ?Walk 10 feet on uneven surface  ?activity ? ? ?Assist Walk 10 feet on uneven surfaces activity did not occur: Safety/medical concerns ? ? ?Assist level: Contact Guard/Touching assist ?Assistive device: Walker-rolling  ? ?Wheelchair ? ? ? ? ?Assist   ?  ?Wheelchair activity did not occur: Safety/medical concerns ? ?  ?   ? ? ?Wheelchair 50 feet with 2 turns activity ? ? ? ?Assist ? ?  ?Wheelchair 50 feet with 2 turns activity did not occur: Safety/medical  concerns ? ? ?   ? ?Wheelchair 150 feet activity  ? ? ? ?Assist ? Wheelchair 150 feet activity did not occur: Safety/medical concerns ? ? ?   ? ?Blood pressure 107/66, pulse 65, temperature (!) 97.5 ?F (36.4 ?C), temperature source Oral, resp. rate 18, height '5\' 8"'$  (1.727 m), weight 98.6 kg, SpO2 92 %. ? ?Medical Problem List and Plan: ?1. Debility secondary to pancreatitis.  ?            -patient may shower ?            -ELOS/Goals: 10 days S ?            Continue CIR- PT, OT ? HFU scheduled ?2.  Impaired mobility: continue Lovenox while hospitalized.  ?3. Low back pain: add kpad, order lidocaine patch prn, continue tylenol prn.  ?4. Agitation at night: continue seroquel 12.'5mg'$  HS ?5. Neuropsych: This patient is not capable of making decisions on his own behalf. ?6. Skin/Wound Care: Routine pressure relief measures.  ?7. Fluids/Electrolytes/Nutrition: Monitor I/O. Check CMET in am ?8. Pancreatitis: NO GI symptoms--has resolved.   ?9. Diverticulitis: Continue Augmentin. On Miralax and senna for bowel regimen.  ?10 Parkinson's disease: Recent admission for hallucinations. Discussed deep brain stimulation.  ?            --continue Sinemet IR TID with Sinemet ER at bedtime.  ?11. CAD?Chronic diastolic CHF: Monitor for symtoms with increase in activity.  ?--Add low salt restrictions.  ?--Likely exacerbated by low protein stores. Add juven.  ?--Monitor daily weights and for signs of overload ?            --Transitioned to oral Lasix 03/09.  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 T2DM: BS poorly controlled likely due to supplements TID BETWEEN meals ?--d/c prosource and Ensure Max ?14. Leucocytosis: Continues to improve. Monitor for fevers and other signs of infection. ? 3/12- will double check again Monday ? 3/13- WBC down slightly to 12.7 from 13.1- con't to monitor ?10. Constipation: Continue Senna with mirlax. Resume probiotic. Add magnesium '250mg'$  HS ?11. Suboptimal vitamin D: start ergocalciferol 50,000 U once per  week. ?12. Orthostatic hyptension: resolved TED/abdominal binder may be used as needed.  D/c hydralazine.  ?13. Increased confusion: leukocytosis improving. UA not suggestive of infection. Repeat CBC on Monday ?14. Obesity: BMI 33.05: weight increased: add additional '10mg'$  Lasix today ? ? ?LOS: ?7 days ?A FACE TO FACE EVALUATION WAS PERFORMED ? ?Martha Clan P Lakeesha Fontanilla ?01/15/2022, 11:11 AM  ? ?  ?

## 2022-01-15 NOTE — Progress Notes (Signed)
Pt placed self on home cpap unit. Tolerating well at this time. RT will cont. To monitor  ?

## 2022-01-15 NOTE — Progress Notes (Signed)
Physical Therapy Session Note ? ?Patient Details  ?Name: Jordan [name] ?MRN: [id number] ?Date of Birth: 11-19-1940 ? ?Today's Date: 01/15/2022 ?PT Individual Time: 9323-5573 ?PT Individual Time Calculation (min): 58 min  ? ?Short Term Goals: ?Week 1:  PT Short Term Goal 1 (Week 1): STG = LTG 2/2 LOS ? ?Skilled Therapeutic Interventions/Progress Updates:  ? Received pt sitting on commode - increased time spent toileting. Pt agreeable to PT treatment and denied any pain during session. Session with emphasis on toileting, functional mobility/transfers, generalized strengthening and endurance, simulated car transfer, stair navigation, dynamic standing balance/coordination, and gait training. Pt performed all sit<>stands with RW and CGA throughout session. Pt ambulated 53f with RW and CGA to WC [facility] and [facility] purposes. Pt performed ambulatory simulated car transfer with RW and CGA and ambulated 146fon uneven surfaces (ramp) with RW and CGA - cues for RW safety and cadence adjustments. Pt then navigated 8 steps with 2 rails and min A ascending and descending with a step through pattern - cues to ensure foot was completley on step prior to proceeding. Pt then ambulated 13919fith RW and CGA/close supervision - cues to keep RW within BOS and for attention as pt distracted telling therapist stories. Pt requested to return to recliner and transferred WC<>recliner with RW and CGA. Concluded session with pt sitting in recliner, needs within reach, and seatbelt alarm on. Provided pt with fresh ice water. ? ?Therapy Documentation ?Precautions:  ?Precautions ?Precautions: Fall, Back ?Precaution Comments: L2 kyphoplasty 12/29/21 ?Restrictions ?Weight Bearing Restrictions: No ? ?Therapy/Group: Individual Therapy ?AnnBlenda NicelynnBecky Sax, DPT  ?01/15/2022, 7:23 AM  ?

## 2022-01-15 NOTE — Progress Notes (Addendum)
Speech Language Pathology Weekly Progress and Session Note ? ?Patient Details  ?Name: Jordan [name] ?MRN: [id number] ?Date of Birth: 06-12-1941 ? ?Beginning of progress report period: January 09, 2022 ?End of progress report period: January 15, 2022 ? ?Today's Date: 01/15/2022 ?SLP Individual Time: 6286-3817 ?SLP Individual Time Calculation (min): 45 min ? ?Short Term Goals: ?Week 1: SLP Short Term Goal 1 (Week 1): Patient will consume regular textures, thin liquids without overt s/s aspiration or penetration at mod I level supervision. ?SLP Short Term Goal 1 - Progress (Week 1): Met ?SLP Short Term Goal 2 (Week 1): Patient will perform complex level problem solving tasks (money management, medication management) with supervision to mod I level supervision/cues. ?SLP Short Term Goal 2 - Progress (Week 1): Not met ?SLP Short Term Goal 3 (Week 1): Patient will recall and restate specific therapeutic and medical interventions, utilizing memory book as needed, with supervision level assistance. ?SLP Short Term Goal 3 - Progress (Week 1): Met ? ?  ?New Short Term Goals: ?Week 2: SLP Short Term Goal 1 (Week 2): STG = LTG's due to ELOS ? ?Weekly Progress Updates: ?Pt has made fair progress towards meeting his STG's; has met 2 out of 3 goals. Pt appears ready for discharge. Pt reports baseline difficulty with completing complex iADL tasks and relied on his wife for medication and financial management, as well as meal prep/cooking, and cleaning in the setting of Parkinson's Disease. Recommend next session focus on post-treatment assessment , use of external aids for recall, and caregiver education prior to discharge. May benefit from OP ST to address vocal intensity, in the setting of Parkinson's Disease, if pt is interested. Downgraded problem-solving goal to supervision for basic daily events. ? ?Intensity: Minumum of 1-2 x/day, 30 to 90 minutes ?Frequency: 3 to 5 out of 7 days ?Duration/Length of Stay: Discharge date set for  01/18/2022 ?Treatment/Interventions: Cognitive remediation/compensation;Functional tasks;Medication managment;Patient/family education;Dysphagia/aspiration precaution training ? ? ?Daily Session ?Skilled Therapeutic Interventions: Pt seen this date for skilled ST intervention targeting aforementioned cognitive goals. Pt awake/alert; OOB in recliner chair. Wife present and expressed frustration with short LOS; MD made aware and spoke with pt's wife during [place] session. Pt agreeable to ST intervention in his room. ? ?SLP facilitated today's session by providing education re: foods that may be appropriate and helpful with a dx of DMII. When given Min A for attention to task and mental flexibility, pt successfully create a basic meal plan (morning, lunch, and dinner) utilizing approved foods with written food list present. Continues to follow safety precautions independently. Asked appropriate questions throughout and utilized external aids to facilitate recall of daily therapy schedule. Plan to complete assessment to determine further ST needs at this time given pt's wife was performing iADL's for pt prior to admission. ? ?Hand off to NT and RN at the end of today's session. Continue per current plan of care.   ? ?Pain ?Denies pain ? ?Therapy/Group: Individual Therapy ? ?Jordan [name] A Jordan [name] ?01/15/2022, 1:45 PM ? ? ? ? ? ? ?

## 2022-01-15 NOTE — Plan of Care (Addendum)
?  Problem: RH Problem Solving ?Goal: LTG Patient will demonstrate problem solving for (SLP) ?Description: LTG:  Patient will demonstrate problem solving for basic/complex daily situations with cues  (SLP) ?Flowsheets (Taken 01/15/2022 1356) ?LTG: Patient will demonstrate problem solving for (SLP): Basic daily situations ?LTG Patient will demonstrate problem solving for: Supervision ?  ?

## 2022-01-15 NOTE — Progress Notes (Signed)
Occupational Therapy Discharge Summary ? ?Patient Details  ?Name: Jordan [name] ?MRN: [id number] ?Date of Birth: 04/09/1941 ? ?Patient has met 7 of 7 long term goals due to improved activity tolerance, improved balance, and ability to compensate for deficits.  Patient to discharge at overall Supervision level.  Patient's care partner is independent to provide the necessary physical and cognitive assistance at discharge.   ? ?Reasons goals not met: n/a ? ?Recommendation:  ?Patient will benefit from ongoing skilled OT services in outpatient setting to continue to advance functional skills in the area of BADL, iADL, and provide further education with pt's progressive parkinson's disease . ? ?Equipment: ?TTB ? ?Reasons for discharge: treatment goals met ? ?Patient/family agrees with progress made and goals achieved: Yes ? ?OT Discharge ?Precautions/Restrictions  ?Precautions ?Precautions: Fall;Back ?Precaution Comments: L2 kyphoplasty 12/29/21 ?Restrictions ?Weight Bearing Restrictions: No ?ADL ?ADL ?Equipment Provided: Long-handled sponge ?Eating: Set up ?Where Assessed-Eating: Chair ?Grooming: Setup ?Where Assessed-Grooming: Chair ?Upper Body Bathing: Supervision/safety ?Where Assessed-Upper Body Bathing: Edge of bed ?Lower Body Bathing: Supervision/safety ?Where Assessed-Lower Body Bathing: Edge of bed ?Upper Body Dressing: Supervision/safety ?Where Assessed-Upper Body Dressing: Edge of bed ?Lower Body Dressing: Supervision/safety ?Where Assessed-Lower Body Dressing: Edge of bed ?Toileting: Supervision/safety ?Where Assessed-Toileting: Toilet, Bedside Commode ?Toilet Transfer: Close supervision ?Toilet Transfer Method: Ambulating ?Science writer: Bedside commode, Grab bars ?Tub/Shower Transfer: Close supervison ?Tub/Shower Transfer Method: Sit pivot ?Tub/Shower Equipment: Radio broadcast assistant, Grab bars ?Walk-In Shower Transfer: Not assessed ?Vision ?Baseline Vision/History: 1 Wears glasses ?Patient Visual  Report: No change from baseline ?Vision Assessment?: No apparent visual deficits ?Additional Comments: reports that double vision has improved ?Perception  ?Perception: Within Functional Limits ?Praxis ?Praxis: Intact ?Cognition ?Cognition ?Overall Cognitive Status: History of cognitive impairments - at baseline ?Arousal/Alertness: Awake/alert ?Orientation Level: Person;Place;Situation ?Person: Oriented ?Place: Oriented ?Situation: Oriented ?Memory: Impaired ?Sustained Attention: Appears intact ?Awareness: Appears intact ?Problem Solving: Impaired ?Safety/Judgment: Impaired ?Comments: pt reqiures cues for RW safety with functional mobility ?Brief Interview for Mental Status (BIMS) ?Repetition of Three Words (First Attempt): 3 ?Temporal Orientation: Year: Missed by more than 5 years ("1923") ?Temporal Orientation: Month: Accurate within 5 days ?Temporal Orientation: Day: Correct ?Recall: "Sock": No, could not recall ("sea") ?Recall: "Blue": No, could not recall ("dog") ?Recall: "Bed": Yes, no cue required ?BIMS Summary Score: 8 ?Sensation ?Sensation ?Light Touch: Impaired by gross assessment (pt reports that he cannot feel the bottom of his feet) ?Hot/Cold: Appears Intact ?Proprioception: Not tested ?Stereognosis: Not tested ?Coordination ?Gross Motor Movements are Fluid and Coordinated: No ?Fine Motor Movements are Fluid and Coordinated: No ?Coordination and Movement Description: decreased [place] on LE due to hip stiffness and Parkinson's symptoms. ?Finger Nose Finger Test: delayed but accurate on B sides ?Motor  ?Motor ?Motor: Other (comment) ?Motor - Skilled Clinical Observations: 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 ?Mobility  ?Bed Mobility ?Bed Mobility: Rolling Right;Rolling Left;Sit to Supine;Supine to Sit ?Rolling Right: Independent with assistive device ?Rolling Left: Independent with assistive device ?Supine to Sit: Independent with assistive device ?Sit to Supine: Independent with assistive device ?Transfers ?Sit to  Stand: Supervision/Verbal cueing ?Stand to Sit: Supervision/Verbal cueing  ?Trunk/Postural Assessment  ?Cervical Assessment ?Cervical Assessment: Exceptions to [facility] (forward head) ?Thoracic Assessment ?Thoracic Assessment: Exceptions to [facility] (mild kyphosis) ?Lumbar Assessment ?Lumbar Assessment: Exceptions to [facility] (posterior pelvic tilt) ?Postural Control ?Postural Control: Deficits on evaluation ?Protective Responses: delayed  ?Balance ?Balance ?Balance Assessed: Yes ?Static Sitting Balance ?Static Sitting - Balance Support: Feet supported;Bilateral upper extremity supported ?Static Sitting - Level of Assistance: 6: Modified independent (Device/Increase time) ?  Dynamic Sitting Balance ?Dynamic Sitting - Balance Support: Feet supported;No upper extremity supported ?Dynamic Sitting - Level of Assistance: 6: Modified independent (Device/Increase time) ?Static Standing Balance ?Static Standing - Balance Support: Bilateral upper extremity supported (RW) ?Static Standing - Level of Assistance: 5: Stand by assistance (supervision) ?Dynamic Standing Balance ?Dynamic Standing - Balance Support: Bilateral upper extremity supported (RW) ?Dynamic Standing - Level of Assistance: 5: Stand by assistance (supervision) ?Extremity/Trunk Assessment ?RUE Assessment ?RUE Assessment: Within Functional Limits ?Active Range of Motion (AROM) Comments: sh flexion 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 ? ?[place] ?01/15/2022, 8:14 AM ?

## 2022-01-15 NOTE — Progress Notes (Signed)
Occupational Therapy Session Note ? ?Patient Details  ?Name: Jordan [name] ?MRN: [id number] ?Date of Birth: 1941-09-20 ? ?Today's Date: 01/15/2022 ?OT Individual Time: 6503-5465 & 6812-7517 ?OT Individual Time Calculation (min): 71 min & 28 min ? ? ?Short Term Goals: ?Week 1:  OT Short Term Goal 1 (Week 1): STGs = LTGs ? ?Skilled Therapeutic Interventions/Progress Updates:  ?Session 1 ?Skilled OT intervention completed with focus on d/c planning, ADL retraining, Parkinson's progression education. Pt received supine in bed, agreeable to session, denied pain. Pt continues to demonstrate very slow transitional movements due to distractibility, poor motor planning, and initiation.. Education provided to pt about OPOT recommendation for managing Parkinson's symptoms, with discussion on pt's "fogginess and mistaken comments" that pt reported feeling slightly self-conscious about. Completed bed mobility with min hand held assist for trunk control/preventing twisting, then sitting EOB was close supervision for dynamic sitting balance with L trunk lean demonstrated with cues needed for upright posture. Completed bathing with LH sponge for LB with supervision with cues needed to not bend forward with sponge. Pt required min A for LB dressing this session as therapist unable to locate reacher, and pt with decreased sitting balance session with difficulty utilizing figure 4 position. CGA needed for standing with RW with cues needed to prevent pt from pulling up on walker. Min A required for donning pants over hips as pt with balance difficulty however pt denying dizziness. Donned TEDS and shoes with total A for time. Completed stand pivot to recliner with RW and CGA, cues for sitting at appropriate time. Seated in recliner, pt completed oral/hair hygiene with supervision and cues. Pt was left upright in recliner, with BLE elevated, belt alarm on and all needs in reach at end of session. ? ?Session 2 ?Skilled OT intervention  completed with focus on BUE strengthening. Pt received seated in recliner, with pt's wife not present for family ed. Pt reports that his wife assisted him to the bathroom, with therapist confirming with nursing which in turn led to conversation with pt about his CLOF and d/c recommendations for current date despite pt's wife report of appealing the d/c date. Pt completed the following exercises to promote BUE strength needed for functional tasks: ? ?(With yellow band) ?Horizontal abduction 2x10 ?Shoulder external rotation x10 ?Alternating chest presses x20 ? ?Multimodal cues required for technique throughout. Pt was left seated in recliner, with BLE elevated, belt alarm on and all needs in reach at end of session. ? ? ?Therapy Documentation ?Precautions:  ?Precautions ?Precautions: Fall, Back ?Precaution Comments: L2 kyphoplasty 12/29/21 ?Restrictions ?Weight Bearing Restrictions: No ? ? ?Therapy/Group: Individual Therapy ? ?[place] ?01/15/2022, 7:48 AM ?

## 2022-01-16 LAB — GLUCOSE, CAPILLARY
Glucose-Capillary: 178 mg/dL — ABNORMAL HIGH (ref 70–99)
Glucose-Capillary: 170 mg/dL — ABNORMAL HIGH (ref 70–99)
Glucose-Capillary: 188 mg/dL — ABNORMAL HIGH (ref 70–99)
Glucose-Capillary: 283 mg/dL — ABNORMAL HIGH (ref 70–99)

## 2022-01-16 NOTE — Progress Notes (Signed)
Speech Language Pathology Daily Session Note ? ?Patient Details  ?Name: Jordan [name] ?MRN: [id number] ?Date of Birth: December 23, 1940 ? ?Today's Date: 01/16/2022 ?SLP Individual Time: 1191-4782 ?SLP Individual Time Calculation (min): 50 min ? ?Short Term Goals: ?Week 2: SLP Short Term Goal 1 (Week 2): STG = LTG's due to ELOS ? ?Skilled Therapeutic Interventions: ?Pt seen for skilled ST with focus on cognitive goals, pt in bed with wife present throughout. SLP, pt and wife discussing pt progress over last week as well as compensatory strategies recommended for home environment to promote independence and safety with daily routine. Wife will continue all iADLs and pt encouraged to continue to utilize external memory and orientation aids at home. All questions in SLP scope answered to satisfaction. Pt reports he is at cognitive baseline at this time. Pt completing portions of SLUMS assessment with improved scores in immediate and delayed recall (4/5 words vs 2/5 at eval). Pt left in bed with alarm set and all needs within reach. Cont ST POC.  ? ?Pain ?Pain Assessment ?Pain Scale: 0-10 ?Pain Score: 0-No pain ? ?Therapy/Group: Individual Therapy ? ?Dewaine Conger ?01/16/2022, 12:25 PM ?

## 2022-01-16 NOTE — Progress Notes (Signed)
?                                                       PROGRESS NOTE ? ? ?Subjective/Complaints: ? ?Pt reports he wants to get up on his own- esp since leaving Monday.  ? ?Asleep eating breakfast. ?No complaints.  ? ?ROS:  ? ? ?Pt denies SOB, abd pain, CP, N/V/C/D, and vision changes ? ? ? ?Objective: ?  ?No results found. ?Recent Labs  ?  01/13/22 ?1253  ?WBC 11.1*  ?HGB 13.2  ?HCT 39.0  ?PLT 522*  ? ?Recent Labs  ?  01/13/22 ?1253  ?NA 137  ?K 3.9  ?CL 103  ?CO2 22  ?GLUCOSE 143*  ?BUN 26*  ?CREATININE 0.77  ?CALCIUM 9.7  ? ? ?Intake/Output Summary (Last 24 hours) at 01/16/2022 0943 ?Last data filed at 01/16/2022 [phone] ?Gross per 24 hour  ?Intake 120 ml  ?Output 1325 ml  ?Net -1205 ml  ?  ? ?  ? ?Physical Exam: ?Vital Signs ?Blood pressure 94/69, pulse 66, temperature (!) 97.4 ?F (36.3 ?C), temperature source Oral, resp. rate 18, height '5\' 8"'$  (1.727 m), weight 97.2 kg, SpO2 97 %. ? ? ?General: initially asleep with food on chest; woke- but says doesn't have a good memory; sitting up with tray in front of him; NAD ?HENT: conjugate gaze; oropharynx moist ?CV: regular rate; no JVD ?Pulmonary: CTA B/L; no W/R/R- good air movement ?GI: soft, NT, ND, (+)BS ?Psychiatric: appropriate- but irritable ?Neurological: alert-  ?Neurological:  ?   Mental Status: He is alert.  ?   Comments: Oriented to self, place and date--situation "falls". Able to follow simple motor commands without difficulty. Slow speech ?MSK: ambulating 180 feet with RW CG ? ? ?Assessment/Plan: ?1. Functional deficits which require 3+ hours per day of interdisciplinary therapy in a comprehensive inpatient rehab setting. ?Physiatrist is providing close team supervision and 24 hour management of active medical problems listed below. ?Physiatrist and rehab team continue to assess barriers to discharge/monitor patient progress toward functional and medical goals ? ?Care Tool: ? ?Bathing ?   ?Body parts bathed by patient: Right arm, Left arm, Chest, Abdomen,  Right upper leg, Left upper leg, Right lower leg, Left lower leg, Face  ? Body parts bathed by helper: Buttocks, Right lower leg, Left lower leg ?  ?  ?Bathing assist Assist Level: Supervision/Verbal cueing ?  ?  ?Upper Body Dressing/Undressing ?Upper body dressing   ?What is the patient wearing?: Pull over shirt ?   ?Upper body assist Assist Level: Supervision/Verbal cueing ?   ?Lower Body Dressing/Undressing ?Lower body dressing ? ? ?   ?What is the patient wearing?: Pants ? ?  ? ?Lower body assist Assist for lower body dressing: Minimal Assistance - Patient > 75% ?   ? ?Toileting ?Toileting    ?Toileting assist Assist for toileting: Minimal Assistance - Patient > 75% ?  ?  ?Transfers ?Chair/bed transfer ? ?Transfers assist ?   ? ?Chair/bed transfer assist level: Contact Guard/Touching assist ?  ?  ?Locomotion ?Ambulation ? ? ?Ambulation assist ? ? Ambulation activity did not occur: Safety/medical concerns ? ?Assist level: Contact Guard/Touching assist ?Assistive device: Walker-rolling ?Max distance: 116f  ? ?Walk 10 feet activity ? ? ?Assist ?   ? ?Assist level: Contact Guard/Touching assist ?Assistive device: Walker-rolling  ? ?  Walk 50 feet activity ? ? ?Assist   ? ?Assist level: Contact Guard/Touching assist ?Assistive device: Walker-rolling  ? ? ?Walk 150 feet activity ? ? ?Assist Walk 150 feet activity did not occur: Safety/medical concerns ? ?  ?  ?  ? ?Walk 10 feet on uneven surface  ?activity ? ? ?Assist Walk 10 feet on uneven surfaces activity did not occur: Safety/medical concerns ? ? ?Assist level: Contact Guard/Touching assist ?Assistive device: Walker-rolling  ? ?Wheelchair ? ? ? ? ?Assist   ?  ?Wheelchair activity did not occur: Safety/medical concerns ? ?  ?   ? ? ?Wheelchair 50 feet with 2 turns activity ? ? ? ?Assist ? ?  ?Wheelchair 50 feet with 2 turns activity did not occur: Safety/medical concerns ? ? ?   ? ?Wheelchair 150 feet activity  ? ? ? ?Assist ? Wheelchair 150 feet activity did not  occur: Safety/medical concerns ? ? ?   ? ?Blood pressure 94/69, pulse 66, temperature (!) 97.4 ?F (36.3 ?C), temperature source Oral, resp. rate 18, height '5\' 8"'$  (1.727 m), weight 97.2 kg, SpO2 97 %. ? ?Medical Problem List and Plan: ?1. Debility secondary to pancreatitis.  ?            -patient may shower ?            -ELOS/Goals: 10 days S ?            Con't CIR- PT and OT ? HFU scheduled ?2.  Impaired mobility: continue Lovenox while hospitalized.  ?3. Low back pain: add kpad, order lidocaine patch prn, continue tylenol prn.  ? 3/18- didn't mention pain today- con't regimen ?4. Agitation at night: continue seroquel 12.'5mg'$  HS ?5. Neuropsych: This patient is not capable of making decisions on his own behalf. ?6. Skin/Wound Care: Routine pressure relief measures.  ?7. Fluids/Electrolytes/Nutrition: Monitor I/O. Check CMET in am ?8. Pancreatitis: NO GI symptoms--has resolved.   ?9. Diverticulitis: Continue Augmentin. On Miralax and senna for bowel regimen.  ?10 Parkinson's disease: Recent admission for hallucinations. Discussed deep brain stimulation.  ?            --continue Sinemet IR TID with Sinemet ER at bedtime.  ?11. CAD?Chronic diastolic CHF: Monitor for symtoms with increase in activity.  ?--Add low salt restrictions.  ?--Likely exacerbated by low protein stores. Add juven.  ?--Monitor daily weights and for signs of overload ?            --Transitioned to oral Lasix 03/09.  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 T2DM: BS poorly controlled likely due to supplements TID BETWEEN meals ?--d/c prosource and Ensure Max ?14. Leucocytosis: Continues to improve. Monitor for fevers and other signs of infection. ? 3/12- will double check again Monday ? 3/13- WBC down slightly to 12.7 from 13.1- con't to monitor ? 3/18- Last WBC on 3/15 was 11.1- resolving ?10. Constipation: Continue Senna with mirlax. Resume probiotic. Add magnesium '250mg'$  HS ?11. Suboptimal vitamin D: start ergocalciferol 50,000 U once per week. ?12.  Orthostatic hyptension: resolved TED/abdominal binder may be used as needed.  D/c hydralazine.  ?13. Increased confusion: leukocytosis improving. UA not suggestive of infection. Repeat CBC on Monday ? 3/18- labs on Monday ?14. Obesity: BMI 33.05: weight increased: add additional '10mg'$  Lasix today ? ? ?LOS: ?8 days ?A FACE TO FACE EVALUATION WAS PERFORMED ? ?Ronasia Isola ?01/16/2022, 9:43 AM  ? ?  ?

## 2022-01-16 NOTE — Progress Notes (Signed)
Speech Language Pathology Discharge Summary ? ?Patient Details  ?Name: Jordan [name] ?MRN: [id number] ?Date of Birth: [birth year] ? ?Patient has met 4 of 4 long term goals.  Patient to discharge at overall Modified Independent;Supervision level.  ? ?Clinical Impression/Discharge Summary:   Pt has made consistent gains during stay and has met 4 out of 4 long term goals. Pt is discharging at an overall mod I level for cognition and memory and Supervision A for problem solving. Pt has a very supportive wife who is responsible for all iADLs and can provide supervision with complex cognitive tasks. Pt and wife have been educated on compensatory strategies for functional memory, fall prevention and safety promotion in home environment. Pt will benefit from continued use of external aids for orientation and recall. Recommend 24/7 supervision, no follow up ST services warranted at this time as patient appears to be at baseline function. ? ?Care Partner:  ?Caregiver Able to Provide Assistance: Yes  ?Type of Caregiver Assistance: Physical;Cognitive ? ?Recommendation:  ?None  ? ?Reasons for discharge: Discharged from hospital;Treatment goals met  ? ?Patient/Family Agrees with Progress Made and Goals Achieved: Yes  ? ? ?Dewaine Conger ?01/16/2022, 12:14 PM ? ?

## 2022-01-17 LAB — GLUCOSE, CAPILLARY
Glucose-Capillary: 183 mg/dL — ABNORMAL HIGH (ref 70–99)
Glucose-Capillary: 197 mg/dL — ABNORMAL HIGH (ref 70–99)
Glucose-Capillary: 164 mg/dL — ABNORMAL HIGH (ref 70–99)
Glucose-Capillary: 258 mg/dL — ABNORMAL HIGH (ref 70–99)

## 2022-01-17 NOTE — Progress Notes (Signed)
Physical Therapy Discharge Summary ? ?Patient Details  ?Name: Jordan [name] ?MRN: [id number] ?Date of Birth: 1941/10/27 ? ?Today's Date: 01/17/2022 ?PT Individual Time: 1100-1157 ?PT Individual Time Calculation (min): 57 min  ? ?Patient has met 5 of 5 long term goals due to improved activity tolerance, improved balance, improved postural control, increased strength, decreased pain, ability to compensate for deficits, improved awareness, and improved coordination. Patient to discharge at an ambulatory level Supervision with RW.  Patient's care partner is independent to provide the necessary physical and cognitive assistance at discharge. Pt's wife attended family education training on 3/19 and verbalized and demonstrated confidence with all tasks to ensure safe discharge home. Education with particular emphasis on Parkinson's Disease, progression, current impairments and importance of 24/7 supervision due to physical and cognitive deficits as well as pt's poor safety awareness.  ? ?All goals met  ? ?Recommendation:  ?Patient will benefit from ongoing skilled PT services in outpatient setting to continue to advance safe functional mobility, address ongoing impairments in generalized strengthening and endurance, dynamic standing balance/coordination, NMR, gait training, and to minimize fall risk. ? ?Equipment: ?No equipment provided - pt already has RW ? ?Reasons for discharge: treatment goals met ? ?Patient/family agrees with progress made and goals achieved: Yes ? ?Today's Interventions: ?Received pt sitting in recliner with abdominal binder and ted hose donned. Pt agreeable to PT treatment and denied any pain during session. Pt's wife present for family education training. Session with emphasis on discharge planning, functional mobility/transfers, generalized strengthening and endurance, dynamic standing balance/coordination, gait training, and simulated car transfers. Pt reported feeling dizzy with OT this morning,  then also reported history of vestibular symptoms, stating that he had an appointment to address these concerns just prior to this hospital admission. BP sitting in recliner: 122/78 - pt asymptomatic. Pt performed all transfers with RW and close supervision throughout session - educated wife on need for close supervision due to pt's retropulsion, impaired motor planning/sequencing when turning specifically, and poor RW safety awareness. BP standing with RW: 88/67 - pt asymptomatic. Of note, pt has difficulty identifying "dizziness" as a result of low BP or due to vestibular symptoms; unable to determine which was limiting pt the most. Pt ambulated 129f with RW and supervision [facility] - educed wife on need to provide cues for attention, proximity to RW, and to increase step length/height. Upon getting onto elevator, pt suddenly turning and yelling "chair", but did not step feet, move RW, or back up resulting in L lateral LOB requiring mod A to correct - extensive time spent educating pt on RW safety and turning, importance of recognizing onset of fatigue/dizziness, and communicating with wife/therapist while educating wife on positioning, body mechanics, and importance of using gait belt (wife reports she was using gait belt prior to admission). In ortho gym, pt performed ambulatory simulated car transfer with RW and CGA/supervision provided by wife - wife provided appropriate cues for safety when turning and demonstrating excellent carry over with education. Returned to room and wife assisted pt back to recliner with RW and supervision, again demonstrating great carry over with cues, body mechanics, and RW safety. Concluded session with pt sitting in recliner, needs within reach, and seatbelt alarm on. Cleared pt's wife to assist to/from bathroom - notified RN and updated safety plan.  ? ?PT Discharge ?Precautions/Restrictions ?Precautions ?Precautions: Fall;Back ?Precaution Comments: L2 kyphoplasty  12/29/21 ?Restrictions ?Weight Bearing Restrictions: No ?Pain Interference ?Pain Interference ?Pain Effect on Sleep: 0. Does not apply - I  have not had any pain or hurting in the past 5 days ?Pain Interference with Therapy Activities: 0. Does not apply - I have not received rehabilitationtherapy in the past 5 days ?Pain Interference with Day-to-Day Activities: 1. Rarely or not at all ?Cognition ?Overall Cognitive Status: History of cognitive impairments - at baseline ?Arousal/Alertness: Awake/alert ?Orientation Level: Oriented X4 ?Memory: Impaired ?Awareness: Appears intact ?Problem Solving: Impaired ?Safety/Judgment: Impaired ?Comments: pt reqiures cues for RW safety with functional mobility ?Sensation ?Sensation ?Light Touch: Impaired by gross assessment (pt reports that he cannot feel the bottom of his feet) ?Proprioception: Impaired by gross assessment ?Coordination ?Gross Motor Movements are Fluid and Coordinated: No ?Fine Motor Movements are Fluid and Coordinated: No ?Coordination and Movement Description: grossly uncoordinated due to fatigue, weakness/deconditioning, impaired motor planning/sequencing, and 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 ?Finger Nose Finger Test: delayed but accurate on B sides ?Motor  ?Motor ?Motor: Other (comment) ?Motor - Skilled Clinical Observations: 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  ?Mobility ?Bed Mobility ?Bed Mobility: Rolling Right;Rolling Left;Sit to Supine;Supine to Sit ?Rolling Right: Independent with assistive device ?Rolling Left: Independent with assistive device ?Supine to Sit: Independent with assistive device ?Sit to Supine: Independent with assistive device ?Transfers ?Transfers: Sit to Stand;Stand to [employer] Transfers ?Sit to Stand: Supervision/Verbal cueing ?Stand to Sit: Supervision/Verbal cueing ?Stand Pivot Transfers: Supervision/Verbal cueing ?Stand Pivot Transfer Details: Verbal cues for technique;Verbal cues for precautions/safety;Verbal cues for safe use of  DME/AE ?Stand Pivot Transfer Details (indicate cue type and reason): verbal cues for RW safety and to back up completly to chair and reach back prior to sitting ?Transfer (Assistive device): Rolling walker ?Locomotion  ?Gait ?Ambulation: Yes ?Gait Assistance: Supervision/Verbal cueing ?Gait Distance (Feet): 125 Feet ?Assistive device: Rolling walker ?Gait Assistance Details: Verbal cues for gait pattern;Verbal cues for sequencing;Verbal cues for technique;Verbal cues for precautions/safety;Verbal cues for safe use of DME/AE ?Gait Assistance Details: verbal cues for proximity to RW, to increased step length/height, RW safety when turning, and for focus ?Gait ?Gait: Yes ?Gait Pattern: Impaired ?Gait Pattern: Step-to pattern;Decreased step length - right;Decreased step length - left;Decreased stride length;Shuffle;Trunk flexed;Poor foot clearance - left;Poor foot clearance - right;Narrow base of support ?Gait velocity: decreased ?Stairs / Additional Locomotion ?Stairs: Yes ?Stairs Assistance: Minimal Assistance - Patient > 75% ?Stair Management Technique: Two rails ?Number of Stairs: 8 ?Height of Stairs: 6 ?Ramp: Contact Guard/touching assist (RW) ?Pick up small object from the floor assist level: Dependent - Patient 0% ?Wheelchair Mobility ?Wheelchair Mobility: No  ?Trunk/Postural Assessment  ?Cervical Assessment ?Cervical Assessment: Exceptions to [facility] (forward head) ?Thoracic Assessment ?Thoracic Assessment: Exceptions to [facility] (mild kyphosis) ?Lumbar Assessment ?Lumbar Assessment: Exceptions to [facility] (posterior pelvic tilt) ?Postural Control ?Postural Control: Deficits on evaluation ?Protective Responses: delayed  ?Balance ?Balance ?Balance Assessed: Yes ?Static Sitting Balance ?Static Sitting - Balance Support: Feet supported;Bilateral upper extremity supported ?Static Sitting - Level of Assistance: 6: Modified independent (Device/Increase time) ?Dynamic Sitting Balance ?Dynamic Sitting - Balance Support: Feet  supported;No upper extremity supported ?Dynamic Sitting - Level of Assistance: 6: Modified independent (Device/Increase time) ?Static Standing Balance ?Static Standing - Balance Support: Bilateral upper extremity support

## 2022-01-17 NOTE — Progress Notes (Signed)
Patient has home CPAP unit at bedside.  Patient is able to place CPAP on with no assistance.  ?

## 2022-01-17 NOTE — Progress Notes (Signed)
Occupational Therapy Session Note ? ?Patient Details  ?Name: Jordan [name] ?MRN: [id number] ?Date of Birth: 1941/04/09 ? ?Today's Date: 01/17/2022 ?OT Individual Time: 3888-2800 ?OT Individual Time Calculation (min): 57 min  ? ? ?Short Term Goals: ?Week 1:  OT Short Term Goal 1 (Week 1): STGs = LTGs ? ?Skilled Therapeutic Interventions/Progress Updates:  ?Pt greeted  supine in bed agreeable to OT intervention. Session focus on BADL reeducation, functional mobility, dynamic standing balance and decreasing overall caregiver burden.      ?Pt completed supine>sit with supervision with use of bed features as pt reports adjustable bed at home. Pt able to state 1/3 back precautions with education provided on 3/3. Pt was able to sit>stand from EOB with CGA with RW and ambulate to sink with Rw. Pt completed bathing tasks from sink however pt with dizzy spell in standing needing MAX efforts to lower pt safely in w/c. Pt reports that he did not lose consciousness however pt did not respond to OTAs cues to sit back down. Donned ABD and TEDS post episode. Pt completed UB bathing at sink with [place]. Pt needed verbal cues to remember not to bend during bathing tasks. Pt ambulated to bathroom with Rw and CGA, pt completed 3/3 toileting tasks with CGA and completed pericare bathing from sitting on toilet with set- up assist, however pt had another dizzy episode in standing needing MAX efforts to sit pt back down on toilet.  Pt reports he has "vertigo" and is being treated for this, unsure of the validity of this statement. Pt ambulated back to recliner with RW and CGA. pt left seated in recliner with alarm belt activated and all needs within reach, nurse aware of dizzy episodes.                      ? ? ?Therapy Documentation ?Precautions:  ?Precautions ?Precautions: Fall, Back ?Precaution Comments: L2 kyphoplasty 12/29/21 ?Restrictions ?Weight Bearing Restrictions: No ? ?Pain: no pain reported during session  ? ? ?Therapy/Group:  Individual Therapy ? ?Precious Haws ?01/17/2022, 11:58 AM ?

## 2022-01-17 NOTE — Progress Notes (Signed)
?                                                       PROGRESS NOTE ? ? ?Subjective/Complaints: ? ?Pt reports just woke up- no issues- slept well.  ?ROS:  ? ?Pt denies SOB, abd pain, CP, N/V/C/D, and vision changes ? ? ? ?Objective: ?  ?No results found. ?No results for input(s): WBC, HGB, HCT, PLT in the last 72 hours. ? ?No results for input(s): NA, K, CL, CO2, GLUCOSE, BUN, CREATININE, CALCIUM in the last 72 hours. ? ? ?Intake/Output Summary (Last 24 hours) at 01/17/2022 0941 ?Last data filed at 01/17/2022 3825 ?Gross per 24 hour  ?Intake 820 ml  ?Output 1275 ml  ?Net -455 ml  ?  ? ?  ? ?Physical Exam: ?Vital Signs ?Blood pressure 97/62, pulse 75, temperature 98.1 ?F (36.7 ?C), temperature source Oral, resp. rate 18, height '5\' 8"'$  (1.727 m), weight 96.4 kg, SpO2 93 %. ? ? ? ?General: awake, alert, appropriate, supine initially asleep- woke to stimuli; NAD ?HENT: conjugate gaze; oropharynx moist ?CV: regular rate; no JVD ?Pulmonary: CTA B/L; no W/R/R- good air movement ?GI: soft, NT, ND, (+)BS ?Psychiatric: appropriate but irritable about being woken up ?Neurological: alert- but sleepy ?Neurological:  ?   Mental Status: He is alert.  ?   Comments: Oriented to self, place and date--situation "falls". Able to follow simple motor commands without difficulty. Slow speech ?MSK: ambulating 180 feet with RW CG ? ? ?Assessment/Plan: ?1. Functional deficits which require 3+ hours per day of interdisciplinary therapy in a comprehensive inpatient rehab setting. ?Physiatrist is providing close team supervision and 24 hour management of active medical problems listed below. ?Physiatrist and rehab team continue to assess barriers to discharge/monitor patient progress toward functional and medical goals ? ?Care Tool: ? ?Bathing ?   ?Body parts bathed by patient: Right arm, Left arm, Chest, Abdomen, Right upper leg, Left upper leg, Right lower leg, Left lower leg, Face  ? Body parts bathed by helper: Buttocks, Right lower leg,  Left lower leg ?  ?  ?Bathing assist Assist Level: Supervision/Verbal cueing ?  ?  ?Upper Body Dressing/Undressing ?Upper body dressing   ?What is the patient wearing?: Pull over shirt ?   ?Upper body assist Assist Level: Supervision/Verbal cueing ?   ?Lower Body Dressing/Undressing ?Lower body dressing ? ? ?   ?What is the patient wearing?: Pants ? ?  ? ?Lower body assist Assist for lower body dressing: Minimal Assistance - Patient > 75% ?   ? ?Toileting ?Toileting    ?Toileting assist Assist for toileting: Minimal Assistance - Patient > 75% ?  ?  ?Transfers ?Chair/bed transfer ? ?Transfers assist ?   ? ?Chair/bed transfer assist level: Contact Guard/Touching assist ?  ?  ?Locomotion ?Ambulation ? ? ?Ambulation assist ? ? Ambulation activity did not occur: Safety/medical concerns ? ?Assist level: Contact Guard/Touching assist ?Assistive device: Walker-rolling ?Max distance: 13f  ? ?Walk 10 feet activity ? ? ?Assist ?   ? ?Assist level: Contact Guard/Touching assist ?Assistive device: Walker-rolling  ? ?Walk 50 feet activity ? ? ?Assist   ? ?Assist level: Contact Guard/Touching assist ?Assistive device: Walker-rolling  ? ? ?Walk 150 feet activity ? ? ?Assist Walk 150 feet activity did not occur: Safety/medical concerns ? ?  ?  ?  ? ?  Walk 10 feet on uneven surface  ?activity ? ? ?Assist Walk 10 feet on uneven surfaces activity did not occur: Safety/medical concerns ? ? ?Assist level: Contact Guard/Touching assist ?Assistive device: Walker-rolling  ? ?Wheelchair ? ? ? ? ?Assist   ?  ?Wheelchair activity did not occur: Safety/medical concerns ? ?  ?   ? ? ?Wheelchair 50 feet with 2 turns activity ? ? ? ?Assist ? ?  ?Wheelchair 50 feet with 2 turns activity did not occur: Safety/medical concerns ? ? ?   ? ?Wheelchair 150 feet activity  ? ? ? ?Assist ? Wheelchair 150 feet activity did not occur: Safety/medical concerns ? ? ?   ? ?Blood pressure 97/62, pulse 75, temperature 98.1 ?F (36.7 ?C), temperature source Oral,  resp. rate 18, height '5\' 8"'$  (1.727 m), weight 96.4 kg, SpO2 93 %. ? ?Medical Problem List and Plan: ?1. Debility secondary to pancreatitis.  ?            -patient may shower ?            -ELOS/Goals: 10 days S ?            Con't CIR- PT and OT- d/c tomorrow ? HFU scheduled ?2.  Impaired mobility: continue Lovenox while hospitalized.  ?3. Low back pain: add kpad, order lidocaine patch prn, continue tylenol prn.  ? 3/18- didn't mention pain today- con't regimen ?4. Agitation at night: continue seroquel 12.'5mg'$  HS ?5. Neuropsych: This patient is not capable of making decisions on his own behalf. ?6. Skin/Wound Care: Routine pressure relief measures.  ?7. Fluids/Electrolytes/Nutrition: Monitor I/O. Check CMET in am ?8. Pancreatitis: NO GI symptoms--has resolved.   ?9. Diverticulitis: Continue Augmentin. On Miralax and senna for bowel regimen.  ?10 Parkinson's disease: Recent admission for hallucinations. Discussed deep brain stimulation.  ?            --continue Sinemet IR TID with Sinemet ER at bedtime.  ?11. CAD?Chronic diastolic CHF: Monitor for symtoms with increase in activity.  ?--Add low salt restrictions.  ?--Likely exacerbated by low protein stores. Add juven.  ?--Monitor daily weights and for signs of overload ?            --Transitioned to oral Lasix 03/09.  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 T2DM: BS poorly controlled likely due to supplements TID BETWEEN meals ?--d/c prosource and Ensure Max ? 3/19- CBG's- somewhat elevated- saw snacks at bedside- con't regimen and monitor- encourage no processed snacks.  ?14. Leucocytosis: Continues to improve. Monitor for fevers and other signs of infection. ? 3/12- will double check again Monday ? 3/13- WBC down slightly to 12.7 from 13.1- con't to monitor ? 3/18- Last WBC on 3/15 was 11.1- resolving ?10. Constipation: Continue Senna with mirlax. Resume probiotic. Add magnesium '250mg'$  HS ?11. Suboptimal vitamin D: start ergocalciferol 50,000 U once per week. ?12.  Orthostatic hyptension: resolved TED/abdominal binder may be used as needed.  D/c hydralazine.  ?13. Increased confusion: leukocytosis improving. UA not suggestive of infection. Repeat CBC on Monday ? 3/18- labs on Monday ?14. Obesity: BMI 33.05: weight increased: add additional '10mg'$  Lasix today ? ? ?LOS: ?9 days ?A FACE TO FACE EVALUATION WAS PERFORMED ? ?Merrill Deanda ?01/17/2022, 9:41 AM  ? ?  ?

## 2022-01-18 LAB — BASIC METABOLIC PANEL
Anion gap: 9 (ref 5–15)
BUN: 14 mg/dL (ref 8–23)
CO2: 26 mmol/L (ref 22–32)
Calcium: 9.6 mg/dL (ref 8.9–10.3)
Chloride: 104 mmol/L (ref 98–111)
Creatinine, Ser: 0.89 mg/dL (ref 0.61–1.24)
GFR, Estimated: >60 mL/min (ref >60)
Glucose, Bld: 166 mg/dL — ABNORMAL HIGH (ref 70–99)
Potassium: 3.6 mmol/L (ref 3.5–5.1)
Sodium: 139 mmol/L (ref 135–145)

## 2022-01-18 LAB — CBC
HCT: 38.8 % — ABNORMAL LOW (ref 39.0–52.0)
Hemoglobin: 12.5 g/dL — ABNORMAL LOW (ref 13.0–17.0)
MCH: 29.2 pg (ref 26.0–34.0)
MCHC: 32.2 g/dL (ref 30.0–36.0)
MCV: 90.7 fL (ref 80.0–100.0)
Platelets: 395 10*3/uL (ref 150–400)
RBC: 4.28 MIL/uL (ref 4.22–5.81)
RDW: 13.2 % (ref 11.5–15.5)
WBC: 9.4 10*3/uL (ref 4.0–10.5)
nRBC: 0 % (ref 0.0–0.2)

## 2022-01-18 LAB — GLUCOSE, CAPILLARY: Glucose-Capillary: 162 mg/dL — ABNORMAL HIGH (ref 70–99)

## 2022-01-18 MED ORDER — FUROSEMIDE 20 MG PO TABS: ORAL_TABLET | ORAL | 0 refills | Status: DC

## 2022-01-18 MED ORDER — QUETIAPINE FUMARATE 25 MG PO TABS: 12.5000 mg | ORAL_TABLET | Freq: Every day | ORAL | 0 refills | Status: DC

## 2022-01-18 MED ORDER — ACETAMINOPHEN 325 MG PO TABS
325.0000 mg | ORAL_TABLET | ORAL | Status: DC | PRN
Start: 2022-01-18 — End: 2022-04-30

## 2022-01-18 MED ORDER — CLOPIDOGREL BISULFATE 75 MG PO TABS: 75.0000 mg | ORAL_TABLET | Freq: Every day | ORAL | 11 refills | Status: DC

## 2022-01-18 MED ORDER — MAGNESIUM GLUCONATE 500 MG PO TABS: 250.0000 mg | ORAL_TABLET | Freq: Every day | ORAL | 0 refills | Status: DC

## 2022-01-18 MED ORDER — VITAMIN D (ERGOCALCIFEROL) 1.25 MG (50000 UNIT) PO CAPS: 50000.0000 [IU] | ORAL_CAPSULE | ORAL | 0 refills | Status: DC

## 2022-01-18 MED ORDER — LIDOCAINE 5 % EX PTCH: 1.0000 | MEDICATED_PATCH | Freq: Every day | CUTANEOUS | 0 refills | Status: AC | PRN

## 2022-01-18 MED ORDER — POLYVINYL ALCOHOL 1.4 % OP SOLN: 1.0000 [drp] | OPHTHALMIC | 0 refills | Status: DC | PRN

## 2022-01-18 MED ORDER — SENNOSIDES-DOCUSATE SODIUM 8.6-50 MG PO TABS
2.0000 | ORAL_TABLET | Freq: Every day | ORAL | 0 refills | Status: DC
Start: 2022-01-18 — End: 2022-04-30

## 2022-01-18 MED ORDER — AMANTADINE HCL 100 MG PO CAPS: 100.0000 mg | ORAL_CAPSULE | Freq: Every day | ORAL | 0 refills | Status: DC

## 2022-01-18 MED ORDER — METFORMIN HCL 1000 MG PO TABS: 1000.0000 mg | ORAL_TABLET | Freq: Every day | ORAL | 0 refills | Status: DC

## 2022-01-18 NOTE — Progress Notes (Signed)
?                                                       PROGRESS NOTE ? ? ?Subjective/Complaints: ?No new complaints this morning ?Discussed his response to amantadine over the weekend ?Wife concerned about orthostatic hypotension ? ?ROS:  ? ?Pt denies SOB, abd pain, CP, N/V/C/D, and vision changes, +dizziness after walking >125 feet ? ? ? ?Objective: ?  ?No results found. ?Recent Labs  ?  01/18/22 ?0736  ?WBC 9.4  ?HGB 12.5*  ?HCT 38.8*  ?PLT 395  ? ? ?Recent Labs  ?  01/18/22 ?0736  ?NA 139  ?K 3.6  ?CL 104  ?CO2 26  ?GLUCOSE 166*  ?BUN 14  ?CREATININE 0.89  ?CALCIUM 9.6  ? ? ? ?Intake/Output Summary (Last 24 hours) at 01/18/2022 1309 ?Last data filed at 01/18/2022 1008 ?Gross per 24 hour  ?Intake 417 ml  ?Output 325 ml  ?Net 92 ml  ?  ? ?  ? ?Physical Exam: ?Vital Signs ?Blood pressure (!) 145/68, pulse 64, temperature (!) 97.5 ?F (36.4 ?C), temperature source Oral, resp. rate 18, height '5\' 8"'$  (1.727 m), weight 95.9 kg, SpO2 93 %. ?General: awake, alert, appropriate, supine initially asleep- woke to stimuli; NAD, BMI 32.15 ?HENT: conjugate gaze; oropharynx moist ?CV: regular rate; no JVD ?Pulmonary: CTA B/L; no W/R/R- good air movement ?GI: soft, NT, ND, (+)BS ?Psychiatric: appropriate but irritable about being woken up ?Neurological: alert- but sleepy ?Neurological:  ?   Mental Status: He is alert.  ?   Comments: Oriented to self, place and date--situation "falls". Able to follow simple motor commands without difficulty. Slow speech ?MSK: ambulating 180 feet with RW CG ? ? ?Assessment/Plan: ?1. Functional deficits which require 3+ hours per day of interdisciplinary therapy in a comprehensive inpatient rehab setting. ?Physiatrist is providing close team supervision and 24 hour management of active medical problems listed below. ?Physiatrist and rehab team continue to assess barriers to discharge/monitor patient progress toward functional and medical goals ? ?Care Tool: ? ?Bathing ?   ?Body parts bathed by patient:  Right arm, Left arm, Chest, Abdomen, Front perineal area, Right upper leg, Left upper leg, Face, Left lower leg, Right lower leg  ? Body parts bathed by helper: Buttocks, Right lower leg, Left lower leg ?  ?  ?Bathing assist Assist Level: Supervision/Verbal cueing ?  ?  ?Upper Body Dressing/Undressing ?Upper body dressing   ?What is the patient wearing?: Pull over shirt ?   ?Upper body assist Assist Level: Supervision/Verbal cueing ?   ?Lower Body Dressing/Undressing ?Lower body dressing ? ? ?   ?What is the patient wearing?: Pants ? ?  ? ?Lower body assist Assist for lower body dressing: Supervision/Verbal cueing ?   ? ?Toileting ?Toileting    ?Toileting assist Assist for toileting: Supervision/Verbal cueing ?  ?  ?Transfers ?Chair/bed transfer ? ?Transfers assist ?   ? ?Chair/bed transfer assist level: Supervision/Verbal cueing ?  ?  ?Locomotion ?Ambulation ? ? ?Ambulation assist ? ? Ambulation activity did not occur: Safety/medical concerns ? ?Assist level: Supervision/Verbal cueing ?Assistive device: Walker-rolling ?Max distance: 171f  ? ?Walk 10 feet activity ? ? ?Assist ?   ? ?Assist level: Supervision/Verbal cueing ?Assistive device: Walker-rolling  ? ?Walk 50 feet activity ? ? ?Assist   ? ?Assist level: Supervision/Verbal cueing ?  Assistive device: Walker-rolling  ? ? ?Walk 150 feet activity ? ? ?Assist Walk 150 feet activity did not occur: Safety/medical concerns (fatigue, decreased balance, weakness) ? ?  ?  ?  ? ?Walk 10 feet on uneven surface  ?activity ? ? ?Assist Walk 10 feet on uneven surfaces activity did not occur: Safety/medical concerns ? ? ?Assist level: Contact Guard/Touching assist ?Assistive device: Walker-rolling  ? ?Wheelchair ? ? ? ? ?Assist Is the patient using a wheelchair?: No ?Type of Wheelchair: Manual ?Wheelchair activity did not occur: Safety/medical concerns ? ?Wheelchair assist level: Dependent - Patient 0% ?Max wheelchair distance: 16f  ? ? ?Wheelchair 50 feet with 2 turns  activity ? ? ? ?Assist ? ?  ?Wheelchair 50 feet with 2 turns activity did not occur: Safety/medical concerns ? ? ?Assist Level: Dependent - Patient 0%  ? ?Wheelchair 150 feet activity  ? ? ? ?Assist ? Wheelchair 150 feet activity did not occur: Safety/medical concerns ? ? ?Assist Level: Dependent - Patient 0%  ? ?Blood pressure (!) 145/68, pulse 64, temperature (!) 97.5 ?F (36.4 ?C), temperature source Oral, resp. rate 18, height '5\' 8"'$  (1.727 m), weight 95.9 kg, SpO2 93 %. ? ?Medical Problem List and Plan: ?1. Debility secondary to pancreatitis.  ?            -patient may shower ?            -ELOS/Goals: 10 days S ?            d/c home today ? HFU scheduled ?2.  Impaired mobility: continue Lovenox while hospitalized.  ?3. Low back pain: add kpad, order lidocaine patch prn, continue tylenol prn.  ? 3/18- didn't mention pain today- con't regimen ?4. Agitation at night: continue seroquel 12.'5mg'$  HS ?5. Neuropsych: This patient is not capable of making decisions on his own behalf. ?6. Skin/Wound Care: Routine pressure relief measures.  ?7. Fluids/Electrolytes/Nutrition: Monitor I/O. Check CMET in am ?8. Pancreatitis: NO GI symptoms--has resolved.   ?9. Diverticulitis: Continue Augmentin. On Miralax and senna for bowel regimen.  ?10 Parkinson's disease: Recent admission for hallucinations. Discussed deep brain stimulation.  ?            --continue Sinemet IR TID with Sinemet ER at bedtime.  ?11. CAD?Chronic diastolic CHF: Monitor for symtoms with increase in activity.  ?--Add low salt restrictions.  ?--Likely exacerbated by low protein stores. Add juven.  ?--Monitor daily weights and for signs of overload ?            --Transitioned to oral Lasix 03/09.  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 T2DM: BS poorly controlled likely due to supplements TID BETWEEN meals ?--d/c prosource and Ensure Max ? 3/19- CBG's- somewhat elevated- saw snacks at bedside- con't regimen and monitor- encourage no processed snacks.  ?14.  Leucocytosis: Continues to improve. Monitor for fevers and other signs of infection. ? 3/12- will double check again Monday ? 3/13- WBC down slightly to 12.7 from 13.1- con't to monitor ? 3/18- Last WBC on 3/15 was 11.1- resolving ?10. Constipation: Continue Senna with mirlax. Resume probiotic. Add magnesium '250mg'$  HS ?11. Suboptimal vitamin D: start ergocalciferol 50,000 U once per week. ?12. Orthostatic hyptension: resolved TED/abdominal binder may be used as needed.  D/c hydralazine and lisinopril.  ?13. Increased confusion: leukocytosis improving. UA not suggestive of infection. WBC decreasing on 3/20, monitor outpatient.  ?14. Obesity: BMI 33.05-->32.15: weight down, d/c Lasix.  ? ? >30 minutes spent in discharge of patient including review of medications and  follow-up appointments, physical examination, and in answering all patient's questions  ? ? ?LOS: ?10 days ?A FACE TO FACE EVALUATION WAS PERFORMED ? ?Jordan [name] ?01/18/2022, 1:09 PM  ? ?  ?

## 2022-01-18 NOTE — Progress Notes (Signed)
Patient ID: Jordan [name], male   DOB: 06-02-1941, 81 y.o.   MRN: [id number] ? ?Patient OP referral faxed to [facility] ?

## 2022-01-18 NOTE — Progress Notes (Signed)
Inpatient Rehabilitation Care Coordinator ?Discharge Note  ? ?Patient Details  ?Name: Jordan [name] ?MRN: [id number] ?Date of Birth: 05-Nov-1940 ? ? ?Discharge location: home ? ?Length of Stay: 10 days ? ?Discharge activity level: supervision ? ?Home/community participation: spouse ? ?Patient response ZW:CHENID Literacy - How often do you need to have someone help you when you read instructions, pamphlets, or other written material from your doctor or pharmacy?: Never ? ?Patient response PO:EUMPNT Isolation - How often do you feel lonely or isolated from those around you?: Never ? ?Services provided included: MD, RD, OT, PT, SLP, RN, CM, Neuropsych, SW, Pharmacy ? ?Financial Services:  ?Charity fundraiser Utilized: Private Insurance ?[employer] Medicare ? ?Choices offered to/list presented to: patient spouse ? ?Follow-up services arranged:  ?Outpatient ?   ?Outpatient Servicies: Neuro Rehab ?  ?  ? ?Patient response to transportation need: ?Is the patient able to respond to transportation needs?: Yes ?In the past 12 months, has lack of transportation kept you from medical appointments or from getting medications?: No ?In the past 12 months, has lack of transportation kept you from meetings, work, or from getting things needed for daily living?: No ? ? ? ?Comments (or additional information): ? ?Patient/Family verbalized understanding of follow-up arrangements:  Yes ? ?Individual responsible for coordination of the follow-up plan: Jordan [name] [phone] ? ?Confirmed correct DME delivered: Dyanne Iha 01/18/2022   ? ?Dyanne Iha ?

## 2022-01-18 NOTE — Progress Notes (Signed)
Inpatient Rehabilitation Discharge Medication Review by a Pharmacist ? ?A complete drug regimen review was completed for this patient to identify any potential clinically significant medication issues. ? ?High Risk Drug Classes Is patient taking? Indication by Medication  ?Antipsychotic Yes Seroquel for agitation  ?Anticoagulant No   ?Antibiotic No   ?Opioid Yes Tramadol prn for pain  ?Antiplatelet No   ?Hypoglycemics/insulin Yes Metformin for DM  ?Vasoactive Medication Yes Metoprolol for BP ?Furosemide for fluid  ?Chemotherapy No   ?Other Yes Amantadine, Adderall for initiation  ?Sinemet for Parkinson's ?Lipitor for HLD  ? ? ? ?Type of Medication Issue Identified Description of Issue Recommendation(s)  ?Drug Interaction(s) (clinically significant) ?    ?Duplicate Therapy ?    ?Allergy ?    ?No Medication Administration End Date ?    ?Incorrect Dose ?    ?Additional Drug Therapy Needed ?    ?Significant med changes from prior encounter (inform family/care partners about these prior to discharge).    ?Other ?    ? ? ?Clinically significant medication issues were identified that warrant physician communication and completion of prescribed/recommended actions by midnight of the next day:  No ? ?Pharmacist comments: None ? ?Time spent performing this drug regimen review (minutes):  20 minutes ? ? ?Tad Moore ?01/18/2022 7:52 AM ?

## 2022-01-18 NOTE — Discharge Summary (Signed)
Physician Discharge Summary  ?Patient ID: ?Jordan [name] ?MRN: [id number] ?DOB/AGE: 11-21-1940 81 y.o. ? ?Admit date: 01/08/2022 ?Discharge date: 01/18/2022 ? ?Discharge Diagnoses:  ?Principal Problem: ?  Debility ?Active Problems: ?  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  Narcolepsy without cataplexy ?  Presence of permanent cardiac pacemaker ?  Insomnia ?  Type 2 diabetes mellitus with hyperlipidemia ([place]) ?  Hypoalbuminemia due to protein-calorie malnutrition ([place]) ?  REM behavioral disorder ?  Parkinson disease ([place]) ? ? ?Discharged Condition: stable ? ?Significant Diagnostic Studies: N/A ? ? ?Labs:  ?Basic Metabolic Panel: ?BMP Latest Ref Rng & Units 01/18/2022 01/13/2022 01/11/2022  ?Glucose 70 - 99 mg/dL 166(H) 143(H) 204(H)  ?BUN 8 - 23 mg/dL 14 26(H) 24(H)  ?Creatinine 0.61 - 1.24 mg/dL 0.89 0.77 0.78  ?Sodium 135 - 145 mmol/L 139 137 137  ?Potassium 3.5 - 5.1 mmol/L 3.6 3.9 4.2  ?Chloride 98 - 111 mmol/L 104 103 103  ?CO2 22 - 32 mmol/L '26 22 24  '$ ?Calcium 8.9 - 10.3 mg/dL 9.6 9.7 9.3  ?  ? ?CBC: ?CBC Latest Ref Rng & Units 01/18/2022 01/13/2022 01/11/2022  ?WBC 4.0 - 10.5 K/uL 9.4 11.1(H) 12.7(H)  ?Hemoglobin 13.0 - 17.0 g/dL 12.5(L) 13.2 12.4(L)  ?Hematocrit 39.0 - 52.0 % 38.8(L) 39.0 37.6(L)  ?Platelets 150 - 400 K/uL 395 522(H) 434(H)  ?  ? ?CBG: ?Recent Labs  ?Lab 01/17/22 ?0600 01/17/22 ?1156 01/17/22 ?1650 01/17/22 ?2100 01/18/22 ?9628  ?GLUCAP 183* 197* 164* 258* 162*  ? ? ?Brief HPI:   Jordan [name] is a 81 y.o. male with history of CAD, chronic diastolic CHF, Z6OQ, OSA, Parkinson's disease, recent admission for gait disorder with hallucinations requiring medication adjustment; who was admitted on 12/30/21 with abdominal pain, low-grade fever, leukocytosis, lactic acidosis, mild confusion and lethargy secondary to sepsis.  Work-up revealed elevated lipase questioned due to primary pancreatitis with secondary inflammation of hepatic flexure of colon or primary diverticulitis with secondary extension to pancreatic  tail.  He was treated with IV fluids as well as broad-spectrum antibiotics with improvement in abdominal pain and downward trend in white count.   ? ?Antibiotics were narrowed to Augmentin with recommendations to complete 2 week course.  Hospital course was significant for issues with volume overload, fluctuating mentation as well as dysphagia related to level of alertness.  He was started on regular diet with thin liquids and aspiration precautions.  As intake improved, blood sugars were noted to be on upward trend.  He continued to have limitations due to disorientation with delay in processing, weakness, poor awareness of deficits and decrease in activity tolerance.  CIR was recommended due to functional decline. ? ? ?Hospital Course: Jordan [name] was admitted to rehab 01/08/2022 for inpatient therapies to consist of PT, ST and OT at least three hours five days a week. Past admission physiatrist, therapy team and rehab RN have worked together to provide customized collaborative inpatient rehab.  He was maintained on Augmentin for four additional days to complete his antibiotic course. His blood pressures were monitored on TID basis and he was noted to have significant orthostatic changes. Hydralazine was discontinued and abdominal binder as well as TEDs were used for BP support. Follow up labs showed evidence of pre-renal azotemia and fluids were encouraged. Peripheral edema has greatly improved with weight down to 95.9 therefore lasix was discontinued at discharge. Wife was advised to use this on prn LE edema and follow up with cardiology for furtehr input. Low magnesium levels have improved  with addition of supplement. Follow up CBC showed leucocytosis has resolved and H/H is stable.  ?  ?Vitamin D levels were noted to be suboptimal and he was started on weekly ergocalciferol for supplementation. Juven was added to help supplement low protein stores and ensure was discontinued due to elevated BS. His diabetes  has been monitored with ac/hs CBG checks and SSI was use prn for tighter BS control. Metformin was titrated up to 1000 mg daily as intake was improving. He continued to have low energy levels and Adderall was resumed per wife's input with some improvement.  Amantadine was added on 03/18 to help with activation.  He continues on low dose Seroquel as this has helped with sleep and decreased nighttime hallucinations.  Wife  expressed concerns about recurrent stroke and was advised to resume Plavix which had been held since his kyphoplasty.  His activity tolerance has improved and supervision is recommended for safety.   He will continue to receive outpatient PT and OT at [facility] neuro rehab after discharge. ? ?  ?Rehab course: During patient's stay in rehab weekly team conferences were held to monitor patient's progress, set goals and discuss barriers to discharge. At admission, patient required mod assist with basic self care task and min assist with mobility. He exhibited mild cognitive impairments with mild delay when responding to complex questions and mild dysphagia. He  has had improvement in activity tolerance, balance, postural control as well as ability to compensate for deficits. He is able to complete ADL tasks with supervision. He requires supervision for transfers and to ambulate 125' with RW. He requires supervision with complex cognitive tasks and pt/wife have been educated on compensatory strategies and use of external aids to help with memory/recall. Family education has been completed regarding safety and care.   ? ?Disposition: Home ? ?Diet: Heart healthy/diabetic.  ? ?Special Instructions: ?No alcohol or strenuous activity ?2. Continue binder/TEDs when up. Increase fluid intake ?3.  Monitor BS bid-qid and follow up with PCP for further adjustment. ? ?Discharge Instructions   ? ? Ambulatory referral to Cardiology   Complete by: As directed ?  ? Orthostatic hypotension/ needs follow up  ? ?  ? ?Allergies  as of 01/18/2022   ?No Known Allergies ?  ? ?  ?Medication List  ?  ? ?STOP taking these medications   ? ?amoxicillin-clavulanate 875-125 MG tablet ?Commonly known as: AUGMENTIN ?  ?CO Q-10 PO ?  ?docusate sodium 100 MG capsule ?Commonly known as: COLACE ?  ?feeding supplement (GLUCERNA SHAKE) Liqd ?  ?hydrALAZINE 10 MG tablet ?Commonly known as: APRESOLINE ?  ?insulin aspart 100 UNIT/ML injection ?Commonly known as: novoLOG ?  ?traMADol 50 MG tablet ?Commonly known as: ULTRAM ?  ? ?  ? ?TAKE these medications   ? ?acetaminophen 325 MG tablet ?Commonly known as: TYLENOL ?Take 1-2 tablets (325-650 mg total) by mouth every 4 (four) hours as needed for mild pain. ?What changed:  ?medication strength ?how much to take ?when to take this ?reasons to take this ?  ?amantadine 100 MG capsule ?Commonly known as: SYMMETREL ?Take 1 capsule (100 mg total) by mouth daily. ?Start taking on: January 19, 2022 ?  ?amphetamine-dextroamphetamine 10 MG tablet ?Commonly known as: ADDERALL ?Take 10 mg by mouth daily as needed (help control symptoms of parkinsons). ?  ?atorvastatin 40 MG tablet ?Commonly known as: LIPITOR ?Take 0.5 tablets (20 mg total) by mouth daily. ?  ?bifidobacterium infantis capsule ?Take 1 capsule by mouth daily. ?  ?  Carbidopa-Levodopa ER 25-100 MG tablet controlled release ?Commonly known as: SINEMET CR ?Take 1 tablet by mouth at bedtime. ?  ?carbidopa-levodopa 25-100 MG tablet ?Commonly known as: Sinemet ?Take 1 tablet by mouth 3 (three) times daily. ?  ?cholecalciferol 25 MCG (1000 UNIT) tablet ?Commonly known as: VITAMIN D3 ?Take 1,000 Units by mouth daily. ?  ?clopidogrel 75 MG tablet ?Commonly known as: Plavix ?Take 1 tablet (75 mg total) by mouth daily. ?  ?furosemide 20 MG tablet ?Commonly known as: LASIX ?Use as needed for up to 5 days if swelling in legs recurs. Contact cardiology is swelling does not improve or gets worse. ?What changed:  ?how much to take ?how to take this ?when to take this ?additional  instructions ?  ?lidocaine 5 % ?Commonly known as: LIDODERM ?Place 1 patch onto the skin daily as needed. Purchase over the counter. On for 12 hours and off for 12 hours ?Notes to patient: Apply on for 1

## 2022-01-19 ENCOUNTER — Encounter: Payer: Self-pay | Admitting: Physical Medicine and Rehabilitation

## 2022-01-19 DIAGNOSIS — G4733 Obstructive sleep apnea (adult) (pediatric): Secondary | ICD-10-CM | POA: Diagnosis not present

## 2022-01-20 ENCOUNTER — Telehealth: Payer: Self-pay

## 2022-01-20 NOTE — Telephone Encounter (Signed)
Spoke with patient's spouse [name] regarding Palliative Care services. She states she was unaware of the referral. Patient has a follow up with PCP on 3/29 and would like to discuss with him. She will call back after appointment.  ?
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
## 2022-01-25 ENCOUNTER — Other Ambulatory Visit: Payer: Self-pay | Admitting: Physical Medicine and Rehabilitation

## 2022-01-26 ENCOUNTER — Ambulatory Visit: Payer: Medicare (Managed Care) | Admitting: Internal Medicine

## 2022-01-26 ENCOUNTER — Encounter: Payer: Self-pay | Admitting: Internal Medicine

## 2022-01-26 ENCOUNTER — Other Ambulatory Visit: Payer: Self-pay

## 2022-01-26 VITALS — BP 120/62 | HR 78 | Temp 98.0°F | Ht 70.0 in | Wt 212.0 lb

## 2022-01-26 DIAGNOSIS — Z9989 Dependence on other enabling machines and devices: Secondary | ICD-10-CM

## 2022-01-26 DIAGNOSIS — G4733 Obstructive sleep apnea (adult) (pediatric): Secondary | ICD-10-CM

## 2022-01-26 DIAGNOSIS — I6381 Other cerebral infarction due to occlusion or stenosis of small artery: Secondary | ICD-10-CM

## 2022-01-26 NOTE — Patient Instructions (Addendum)
Order- DME Huey Romans- please enroll in [employer] and send SD card. Continue current settings, mask of choice, humidifier, supplies ? ?Please call if we can help ?

## 2022-01-27 DIAGNOSIS — G47419 Narcolepsy without cataplexy: Secondary | ICD-10-CM | POA: Diagnosis not present

## 2022-01-27 DIAGNOSIS — M6281 Muscle weakness (generalized): Secondary | ICD-10-CM | POA: Diagnosis not present

## 2022-01-27 DIAGNOSIS — I5032 Chronic diastolic (congestive) heart failure: Secondary | ICD-10-CM | POA: Diagnosis not present

## 2022-01-27 DIAGNOSIS — I2510 Atherosclerotic heart disease of native coronary artery without angina pectoris: Secondary | ICD-10-CM | POA: Diagnosis not present

## 2022-01-27 DIAGNOSIS — G20 Parkinson's disease: Secondary | ICD-10-CM | POA: Diagnosis not present

## 2022-01-27 DIAGNOSIS — E1151 Type 2 diabetes mellitus with diabetic peripheral angiopathy without gangrene: Secondary | ICD-10-CM | POA: Diagnosis not present

## 2022-01-27 DIAGNOSIS — K859 Acute pancreatitis without necrosis or infection, unspecified: Secondary | ICD-10-CM | POA: Diagnosis not present

## 2022-01-27 DIAGNOSIS — K5732 Diverticulitis of large intestine without perforation or abscess without bleeding: Secondary | ICD-10-CM | POA: Diagnosis not present

## 2022-01-27 DIAGNOSIS — I679 Cerebrovascular disease, unspecified: Secondary | ICD-10-CM | POA: Diagnosis not present

## 2022-01-27 DIAGNOSIS — I10 Essential (primary) hypertension: Secondary | ICD-10-CM | POA: Diagnosis not present

## 2022-01-27 DIAGNOSIS — G4733 Obstructive sleep apnea (adult) (pediatric): Secondary | ICD-10-CM | POA: Diagnosis not present

## 2022-01-27 DIAGNOSIS — I110 Hypertensive heart disease with heart failure: Secondary | ICD-10-CM | POA: Diagnosis not present

## 2022-01-27 LAB — CUP PACEART REMOTE DEVICE CHECK
Battery Remaining Percentage: 55 %
Brady Statistic RA Percent Paced: 97 %
Brady Statistic RV Percent Paced: 21 %
Date Time Interrogation Session: 20230328092757
Implantable Lead Implant Date: 20170525
Implantable Lead Implant Date: 20170525
Implantable Lead Location: 753859
Implantable Lead Location: 753860
Implantable Lead Model: 377
Implantable Lead Model: 377
Implantable Lead Serial Number: 49470224
Implantable Lead Serial Number: 49485613
Implantable Pulse Generator Implant Date: 20170525
Lead Channel Impedance Value: 468 Ohm
Lead Channel Impedance Value: 507 Ohm
Lead Channel Pacing Threshold Amplitude: 1 V
Lead Channel Pacing Threshold Amplitude: 1.3 V
Lead Channel Pacing Threshold Pulse Width: 0.4 ms
Lead Channel Pacing Threshold Pulse Width: 0.4 ms
Lead Channel Sensing Intrinsic Amplitude: 2.5 mV
Lead Channel Sensing Intrinsic Amplitude: 6.5 mV
Lead Channel Setting Pacing Amplitude: 2.4 V
Lead Channel Setting Pacing Amplitude: 2.4 V
Lead Channel Setting Pacing Pulse Width: 0.4 ms
Pulse Gen Model: 394969
Pulse Gen Serial Number: 68798589

## 2022-01-28 ENCOUNTER — Ambulatory Visit (INDEPENDENT_AMBULATORY_CARE_PROVIDER_SITE_OTHER): Payer: Medicare (Managed Care)

## 2022-01-28 DIAGNOSIS — I495 Sick sinus syndrome: Secondary | ICD-10-CM | POA: Diagnosis not present

## 2022-02-01 ENCOUNTER — Telehealth: Payer: Self-pay

## 2022-02-01 NOTE — Progress Notes (Deleted)
? ?Cardiology Office Note ?Date:  02/01/2022  ?Patient ID:  Jordan [name], Jordan [name] 12/25/1940, MRN [id number] ?PCP:  [name], MD  ?Cardiologist:  Dr. Tamala Julian (2017) ?Electrophysiologist: Dr. Lovena Le ? ?***refresh ?  ?Chief Complaint: *** hospital f/u ? ?History of Present Illness: ?Jordan [name] is a 81 y.o. 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 ?He comes in today to be seen for Dr. Lovena Le, last see by him back in July 2020. ? ?He was admitted to [facility] 12/30/21, abdominal pain, low-grade fever, leukocytosis, lactic acidosis, mild confusion and lethargy secondary to sepsis.  Work-up revealed elevated lipase questioned due to primary pancreatitis with secondary inflammation of hepatic flexure of colon or primary diverticulitis with secondary extension to pancreatic tail.  He was treated with IV fluids as well as broad-spectrum antibiotics, also treated for volume OL during his stay. ?Discharged to CIR and then discharged from there 01/18/22 with instructions for PRN lasix and cardiology follow up ? ? ?BCx2 were both neg x5 days ?TTE noted LVEF 60-65%, no WMA, mild LVH, valves OK ? ?*** HF perhaps 2/2 volume given with spesis ?*** volume ?*** any AFib?  Recent stroke? ?*** + remotes ? ? ?Device information ?Biotronik dual chamber PPM implanted 03/25/2016 ? ? ?Past Medical History:  ?Diagnosis Date  ? Arthritis   ? R shoulder, bone spur  ? Arthritis   ? "hands" (03/25/2016)  ? Chronic diastolic heart failure ([place])   ? Chronic lower back pain   ? Coronary heart disease   ? Dr Pernell Dupre  ? H/O cardiovascular stress test   ? perhaps last one was 2009  ? Heart murmur   ? 12/29/11 echo: mild MR, no AS, trivial TR  ? History of gout   ? Hyperlipidemia   ? Hypertension   ? saw Dr. Linard Millers last earl;y- 2014, cardiac cath. last done ?2009, blocks  seen didn't require any intervention at that point.  ? Narcolepsy   ? [name] 04-11-97; Mean Latency 1.34mn, SOREM 2  ? OSA on CPAP   ? NPSG 12-13-98 AHI 22.7  ? PONV (postoperative nausea and vomiting)   ? Presence of permanent cardiac pacemaker   ? Shortness of breath   ? with exertion   ? [facility] ([facility]   ? Type II diabetes mellitus ([place]   ? ? ?Past Surgical History:  ?Procedure Laterality Date  ? CARDIAC CATHETERIZATION  2009?  ? EP IMPLANTABLE DEVICE N/A 03/25/2016  ? Procedure: Pacemaker Implant - Dual Chamber;  Surgeon: GEvans Lance MD;  [facility];  Service: Cardiovascular;  Laterality: N/A;  ? FRACTURE SURGERY    ? INSERT / REPLACE / REMOVE PACEMAKER  03/24/2016  ? IR KYPHO LUMBAR INC FX REDUCE BONE BX UNI/BIL CANNULATION INC/IMAGING  12/29/2021  ? IR RADIOLOGIST EVAL & MGMT  12/15/2021  ? LEFT AND RIGHT HEART CATHETERIZATION WITH CORONARY ANGIOGRAM N/A 06/05/2014  ? Procedure: LEFT AND RIGHT HEART CATHETERIZATION WITH CORONARY ANGIOGRAM;  Surgeon: HSinclair Grooms MD;  [facility];  Service: Cardiovascular;  Laterality: N/A;  ? NASAL FRACTURE SURGERY    ? SHOULDER ARTHROSCOPY WITH ROTATOR CUFF REPAIR AND SUBACROMIAL DECOMPRESSION Right 08/16/2013  ? Procedure: SHOULDER ARTHROSCOPY WITH ROTATOR CUFF REPAIR AND SUBACROMIAL DECOMPRESSION;  Surgeon: JNita Sells MD;  [facility]  Service: Orthopedics;  Laterality: Right;  Right shoulder arthroscopy rotator cuff repair, subacromial decompression.  ? TONSILLECTOMY    ? ? ?Current Outpatient Medications  ?Medication Sig Dispense Refill  ? acetaminophen (TYLENOL) 325 MG tablet Take 1-2 tablets (325-650 mg total) by mouth every 4 (four) hours as needed for mild pain.    ? amantadine (SYMMETREL) 100 MG capsule Take 1 capsule (100 mg total) by mouth daily. 30 capsule 0  ? amphetamine-dextroamphetamine (ADDERALL) 10 MG tablet Take 10 mg by mouth daily as needed (help control symptoms of parkinsons).    ? atorvastatin (LIPITOR) 40  MG tablet Take 0.5 tablets (20 mg total) by mouth daily. 30 tablet 0  ? bifidobacterium infantis (ALIGN) capsule Take 1 capsule by mouth daily.    ? carbidopa-levodopa (SINEMET) 25-100 MG tablet Take 1 tablet by mouth 3 (three) times daily. 90 tablet 5  ? Carbidopa-Levodopa ER (SINEMET CR) 25-100 MG tablet controlled release Take 1 tablet by mouth at bedtime.    ? cholecalciferol (VITAMIN D3) 25 MCG (1000 UNIT) tablet Take 1,000 Units by mouth daily.    ? clopidogrel (PLAVIX) 75 MG tablet Take 1 tablet (75 mg total) by mouth daily. 30 tablet 11  ? furosemide (LASIX) 20 MG tablet USE AS NEEDED FOR UP TO 5 DAYS IF SWELLING IN LEGS RECURS. CONTACT CARDIOLOGY IS SWELLING DOES NOT IMPROVE OR GETS WORSE. 15 tablet 0  ? lidocaine (LIDODERM) 5 % Place 1 patch onto the skin daily as needed. Purchase over the counter. On for 12 hours and off for 12 hours 30 patch 0  ? magnesium gluconate (MAGONATE) 500 MG tablet Take 0.5 tablets (250 mg total) by mouth at bedtime. 30 tablet 0  ? Melatonin 10 MG TABS Take 10 mg by mouth at bedtime.    ? metFORMIN (GLUCOPHAGE) 1000 MG tablet Take 1 tablet (1,000 mg total) by mouth daily with breakfast. 30 tablet 0  ? metoprolol tartrate (LOPRESSOR) 25 MG tablet Take 1 tablet (25 mg total) by mouth 2 (two) times daily. 60 tablet 0  ? Multiple Vitamin (MULTIVITAMIN WITH MINERALS) TABS tablet Take 1 tablet by mouth daily.    ? NITROSTAT 0.4 MG SL tablet PLACE 1 TABLET (0.4 MG TOTAL) UNDER THE TONGUE EVERY 5 (FIVE) MINUTES AS NEEDED FOR CHEST PAIN. (Patient taking differently: Place 0.4 mg under the tongue every 5 (five) minutes as needed for chest pain.) 25 tablet 5  ? Omega 3 1200 MG CAPS Take 1,200 mg by mouth daily.    ? polyethylene glycol (MIRALAX / GLYCOLAX) 17 g packet Take 17 g by mouth daily. 14 each 0  ? polyvinyl alcohol (LIQUIFILM TEARS) 1.4 % ophthalmic solution Place 1 drop into both eyes as needed for dry eyes. 15 mL 0  ? PRESCRIPTION MEDICATION See admin instructions. CPAP- At  bedtime    ? QUEtiapine (SEROQUEL) 25 MG tablet Take 1/2 tablets (12.5 mg total) by mouth at bedtime. 15 tablet 0  ? senna-docusate (SENOKOT-S) 8.6-50 MG tablet Take 2 tablets by mouth daily with supper. 60 tablet 0  ? Vitamin D, Ergocalciferol, (DRISDOL) 1.25 MG (50000 UNIT) CAPS capsule Take 1 capsule (50,000 Units total) by mouth every 7 (seven) days. 5 capsule 0  ? ?No current facility-administered medications for this visit.  ? ? ?Allergies:   Patient has no known allergies.  ? ?Social History:  The patient  reports that he quit smoking about 54 years ago. His smoking use included cigarettes. He has a 15.00 pack-year smoking history. He has never used smokeless tobacco. He  reports current alcohol use. He reports that he does not use drugs.  ? ?Family History:  The patient's family history includes Colon cancer in his sister; Sleep apnea in his sister.*** ? ?ROS:  Please see the history of present illness.    ?All other systems are reviewed and otherwise negative.  ? ?PHYSICAL EXAM:  ?VS:  There were no vitals taken for this visit. BMI: There is no height or weight on file to calculate BMI. ?Well nourished, well developed, in no acute distress ?HEENT: normocephalic, atraumatic ?Neck: no JVD, carotid bruits or masses ?Cardiac:  *** RRR; no significant murmurs, no rubs, or gallops ?Lungs:  *** CTA b/l, no wheezing, rhonchi or rales ?Abd: soft, nontender ?MS: no deformity or *** atrophy ?Ext: *** no edema ?Skin: warm and dry, no rash ?Neuro:  No gross deficits appreciated ?Psych: euthymic mood, full affect ? ?*** PPM/ICD site is stable, no tethering or discomfort ? ? ?EKG:  Done today and reviewed by myself shows  ?*** ? ? ?01/06/2022: TTE ?1. Left ventricular ejection fraction, by estimation, is 60 to 65%. The  ?left ventricle has normal function. The left ventricle has no regional  ?wall motion abnormalities. There is mild concentric left ventricular  ?hypertrophy. Left ventricular diastolic  ?parameters are  indeterminate.  ? 2. Right ventricular systolic function is low normal. The right  ?ventricular size is normal. There is normal pulmonary artery systolic  ?pressure.  ? 3. Left atrial size was mildly dilated.  ? 4

## 2022-02-01 NOTE — Telephone Encounter (Signed)
Attempted to contact patient's spouse [name] to schedule a Palliative Care consult appointment. No answer left a message to return call.  ? ?

## 2022-02-02 ENCOUNTER — Ambulatory Visit: Payer: Medicare (Managed Care) | Admitting: Occupational Therapy

## 2022-02-02 ENCOUNTER — Ambulatory Visit: Payer: Medicare (Managed Care) | Admitting: Physician Assistant

## 2022-02-02 ENCOUNTER — Ambulatory Visit: Payer: Medicare (Managed Care)

## 2022-02-02 ENCOUNTER — Ambulatory Visit: Payer: Medicare (Managed Care) | Attending: Family Medicine

## 2022-02-02 ENCOUNTER — Encounter: Payer: Self-pay | Admitting: Occupational Therapy

## 2022-02-02 ENCOUNTER — Ambulatory Visit: Payer: Medicare (Managed Care) | Admitting: Physical Therapy

## 2022-02-02 DIAGNOSIS — R2681 Unsteadiness on feet: Secondary | ICD-10-CM | POA: Diagnosis not present

## 2022-02-02 DIAGNOSIS — R2689 Other abnormalities of gait and mobility: Secondary | ICD-10-CM

## 2022-02-02 DIAGNOSIS — R1312 Dysphagia, oropharyngeal phase: Secondary | ICD-10-CM | POA: Diagnosis not present

## 2022-02-02 DIAGNOSIS — M6281 Muscle weakness (generalized): Secondary | ICD-10-CM

## 2022-02-02 DIAGNOSIS — R251 Tremor, unspecified: Secondary | ICD-10-CM | POA: Diagnosis not present

## 2022-02-02 DIAGNOSIS — R293 Abnormal posture: Secondary | ICD-10-CM | POA: Diagnosis not present

## 2022-02-02 DIAGNOSIS — R278 Other lack of coordination: Secondary | ICD-10-CM | POA: Diagnosis not present

## 2022-02-02 DIAGNOSIS — R41840 Attention and concentration deficit: Secondary | ICD-10-CM

## 2022-02-02 DIAGNOSIS — R471 Dysarthria and anarthria: Secondary | ICD-10-CM

## 2022-02-02 DIAGNOSIS — R41844 Frontal lobe and executive function deficit: Secondary | ICD-10-CM

## 2022-02-02 DIAGNOSIS — R41841 Cognitive communication deficit: Secondary | ICD-10-CM | POA: Diagnosis not present

## 2022-02-02 NOTE — Therapy (Signed)
?OUTPATIENT OCCUPATIONAL THERAPY PARKINSON'S EVALUATION ? ?Patient Name: Jordan [name] ?MRN: [id number] ?DOB:[birth year], 81 y.o., male ?Today's Date: 02/02/2022 ? ?PCP: [name], MD ?REFERRING PROVIDER: Cathlyn Parsons, PA-C  ? ? OT End of Session - 02/02/22 0940   ? ? Visit Number 1   ? Number of Visits 17   ? Date for OT Re-Evaluation 04/13/22   16 visits over 10 weeks d/t any scheduling conflicts  ? Authorization Type [employer]   ? Authorization Time Period $20 copay  VL:MN  Follow Medicare Guidelines   ? OT Start Time 0932   ? OT Stop Time 1015   ? OT Time Calculation (min) 43 min   ? Activity Tolerance Patient tolerated treatment well   ? Behavior During Therapy [facility] for tasks assessed/performed   ? ?  ?  ? ?  ? ? ?Past Medical History:  ?Diagnosis Date  ? Arthritis   ? R shoulder, bone spur  ? Arthritis   ? "hands" (03/25/2016)  ? Chronic diastolic heart failure ([place])   ? Chronic lower back pain   ? Coronary heart disease   ? Dr Pernell Dupre  ? H/O cardiovascular stress test   ? perhaps last one was 2009  ? Heart murmur   ? 12/29/11 echo: mild MR, no AS, trivial TR  ? History of gout   ? Hyperlipidemia   ? Hypertension   ? saw Dr. Linard Millers last earl;y- 2014, cardiac cath. last done ?2009, blocks seen didn't require any intervention at that point.  ? Narcolepsy   ? [name] 04-11-97; Mean Latency 1.52mn, SOREM 2  ? OSA on CPAP   ? NPSG 12-13-98 AHI 22.7  ? PONV (postoperative nausea and vomiting)   ? Presence of permanent cardiac pacemaker   ? Shortness of breath   ? with exertion   ? Stroke ([facility]   ? Type II diabetes mellitus ([place]   ? ?Past Surgical History:  ?Procedure Laterality Date  ? CARDIAC CATHETERIZATION  2009?  ? EP IMPLANTABLE DEVICE N/A 03/25/2016  ? Procedure: Pacemaker Implant - Dual Chamber;  Surgeon: GEvans Lance MD;  [facility];  Service: Cardiovascular;  Laterality: N/A;  ? FRACTURE SURGERY    ? INSERT / REPLACE / REMOVE PACEMAKER  03/24/2016  ? IR KYPHO LUMBAR INC FX  REDUCE BONE BX UNI/BIL CANNULATION INC/IMAGING  12/29/2021  ? IR RADIOLOGIST EVAL & MGMT  12/15/2021  ? LEFT AND RIGHT HEART CATHETERIZATION WITH CORONARY ANGIOGRAM N/A 06/05/2014  ? Procedure: LEFT AND RIGHT HEART CATHETERIZATION WITH CORONARY ANGIOGRAM;  Surgeon: HSinclair Grooms MD;  [facility];  Service: Cardiovascular;  Laterality: N/A;  ? NASAL FRACTURE SURGERY    ? SHOULDER ARTHROSCOPY WITH ROTATOR CUFF REPAIR AND SUBACROMIAL DECOMPRESSION Right 08/16/2013  ? Procedure: SHOULDER ARTHROSCOPY WITH ROTATOR CUFF REPAIR AND SUBACROMIAL DECOMPRESSION;  Surgeon: JNita Sells MD;  [place]  Service: Orthopedics;  Laterality: Right;  Right shoulder arthroscopy rotator cuff repair, subacromial decompression.  ? TONSILLECTOMY    ? ?Patient Active Problem List  ? Diagnosis Date Noted  ? Debility 01/08/2022  ? Constipation 01/07/2022  ? Physical deconditioning 01/07/2022  ? Acute metabolic encephalopathy [id number] ? Hypomagnesemia 01/04/2022  ? Pancreatitis 12/30/2021  ? Parkinson's disease ([place] 12/21/2021  ? Parkinson disease ([place] 12/18/2021  ? Frequent falls 12/18/2021  ? History of CVA (cerebrovascular accident) 12/18/2021  ? 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 Hallucinations, visual 07/30/2021  ? Acetabulum fracture, left (  [place]) 06/25/2021  ? Acute hip pain, left 06/24/2021  ? Fall at home, initial encounter 06/24/2021  ? Leukocytosis 06/24/2021  ? Chronic diastolic heart failure ([place])   ? Sinusitis, acute maxillary 06/24/2020  ? AKI (acute kidney injury) ([place])   ? Labile blood glucose   ? Cough   ? Benign essential HTN   ? Skin lesion of hand   ? Hypoalbuminemia due to protein-calorie malnutrition ([place])   ? Controlled type 2 diabetes mellitus with hyperglycemia, without long-term current use of insulin ([place])   ? 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 Morbid obesity ([place])   ? Acute blood loss anemia   ? Acute thalamic infarction ([place]) 04/15/2020  ? Chronotropic incompetence with sinus  node dysfunction ([facility]) 05/25/2019  ? TIA (transient ischemic attack) 02/17/2018  ? Type 2 diabetes mellitus with hyperlipidemia ([place]) 02/17/2018  ? Insomnia 02/01/2018  ? 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 Chronotropic incompetence 07/09/2014  ? Dyspnea on exertion 03/03/2014  ? HLD (hyperlipidemia) 12/13/2007  ? Essential hypertension 12/13/2007  ? Class 1 obesity 12/05/2007  ? 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 Coronary atherosclerosis 12/05/2007  ? ? ?ONSET DATE: 01/26/22 referral date ? ?REFERRING DIAG: R53.81 (ICD-10-CM) - Other malaise R53.81 (ICD-10-CM) - Debility G20 (ICD-10-CM) - Parkinson's disease  ? ?THERAPY DIAG:  ?Other lack of coordination ? ?Muscle weakness (generalized) ? ?Unsteadiness on feet ? ?Attention and concentration deficit ? ?Frontal lobe and executive function deficit ? ?Tremor ? ?SUBJECTIVE:  ? ?SUBJECTIVE STATEMENT: ?Jordan [name] is an 81 year old male with history of CAD/chronic diastolic CHF, OSA, H6PR, gout, Parkinson's diease with recent admission 02/17-02-22-23 worsening of gait disorder with falls with hallucinations which was treated with medication adjustment and he was discharged to home. He was readmitted on 12/30/21 with abdominal pain with low grade fever, leucocytosis, lactic acidosis, mild confusion and lethargy secondary to sepsis. He was found to have elevated lipase-165  with inflammatory changes suggestive of primary pancreatitis with secondary inflammation of hepatic flexure of colon or primary sigmoid diverticulitis with secondary extension to pancreatic tail. He was treated with IVF and broad spectrum antibiotics.  He was found to have oropharyngeal dysphagia exacerbated by fluctuating mentation and diet has been adjusted depending on alertness.   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 ? ?Pt accompanied by: self and  significant other, spouse, [name] ? ?PERTINENT HISTORY: CAD/chronic diastolic CHF, OSA, F1MB, gout, Parkinson's disease, chronic lower back pain, hx of kyphoplasty and L2 repair Feb 2023, left hip fx and repair Aug 2022 ? ?PRECAUTIONS: Fall and ICD/Pacemaker ? ?WEIGHT BEARING RESTRICTIONS No ? ?PAIN:  ?Are you having pain? No ? ?FALLS: Has patient fallen in last 6 months? Yes. Number of falls not recently. PT evaluation today ? ?LIVING ENVIRONMENT: ?Lives with: lives with their family, lives with their spouse, and son - no pets ?Lives in: House/apartment ?Stairs: 10th floor with elevator access  ?Has following equipment at home: Gilford Rile - 2 wheeled, Wheelchair (manual), and shower chair ? ?PLOF: Requires assistive device for independence, Needs assistance with ADLs, Vocation/Vocational requirements: retired, and Leisure: read, sleep, work on the computer ? ?PATIENT GOALS spouse reports wanting patient to get back to prior level of function circa August 2022 with walking with cane/walking stick. ? ?OBJECTIVE:  ? ?HAND DOMINANCE: Right ? ?ADLs: ?Overall ADLs: Pt's spouse assist with ADLs however patient  and spouse report patient is capable for doing a lot of ADLs independently.  ?Transfers/ambulation related to ADLs: ?Eating: Needs assistance for cutting up food ?Grooming: Set up assistance ?UB Dressing: Mod I - increased time ?LB Dressing: Mod I - increased time ?Toileting: Mod I ?Bathing: Set up assistance ?Tub Shower transfers: supervision for safety ?Equipment: Shower seat with back and Walk in shower ? ? ?IADLs: Spouse is completing most IADLs  ?Shopping:  ?Light housekeeping:  ?Meal Prep:  ?Community mobility: Relies on family and friends ?Medication management:  ?Financial management:  ?Handwriting: 75% legible and Mild micrographia ? ?MOBILITY STATUS: Needs Assist: supervision - pt arrived in transport chair. Reports getting around house with rolling walker and w/c  ? ?POSTURE COMMENTS:  ?rounded shoulders and  forward head ? ? ?FUNCTIONAL OUTCOME MEASURES: ? ?Fastening/unfastening 3 buttons: 70.64s ?Physical performance test: PPT#2 (simulated eating) R 21.47s L 25.76s  ? ?COORDINATION: ?9 Hole Peg test: Right: 39.46

## 2022-02-02 NOTE — Therapy (Signed)
?OUTPATIENT SPEECH LANGUAGE PATHOLOGY PARKINSON'S EVALUATION ? ? ?Patient Name: Jordan [name] ?MRN: [id number] ?DOB:1941-09-02, 81 y.o., male ?Today's Date: 02/02/2022 ? ?PCP: [name], MD ?REFERRING PROVIDER: Cathlyn Parsons, PA-C  ? ? End of Session - 02/02/22 0946   ? ? Visit Number 1   ? Number of Visits 25   ? Date for SLP Re-Evaluation 04/30/22   ? Authorization Type [employer]  $20 co pay per day   ? SLP Start Time 1015   ? SLP Stop Time  1100   ? SLP Time Calculation (min) 45 min   ? Activity Tolerance Patient tolerated treatment well   ? ?  ?  ? ?  ? ? ?Past Medical History:  ?Diagnosis Date  ? Arthritis   ? R shoulder, bone spur  ? Arthritis   ? "hands" (03/25/2016)  ? Chronic diastolic heart failure ([place])   ? Chronic lower back pain   ? Coronary heart disease   ? Dr Pernell Dupre  ? H/O cardiovascular stress test   ? perhaps last one was 2009  ? Heart murmur   ? 12/29/11 echo: mild MR, no AS, trivial TR  ? History of gout   ? Hyperlipidemia   ? Hypertension   ? saw Dr. Linard Millers last earl;y- 2014, cardiac cath. last done ?2009, blocks seen didn't require any intervention at that point.  ? Narcolepsy   ? [name] 04-11-97; Mean Latency 1.17mn, SOREM 2  ? OSA on CPAP   ? NPSG 12-13-98 AHI 22.7  ? PONV (postoperative nausea and vomiting)   ? Presence of permanent cardiac pacemaker   ? Shortness of breath   ? with exertion   ? Stroke ([facility]   ? Type II diabetes mellitus ([place]   ? ?Past Surgical History:  ?Procedure Laterality Date  ? CARDIAC CATHETERIZATION  2009?  ? EP IMPLANTABLE DEVICE N/A 03/25/2016  ? Procedure: Pacemaker Implant - Dual Chamber;  Surgeon: GEvans Lance MD;  [facility];  Service: Cardiovascular;  Laterality: N/A;  ? FRACTURE SURGERY    ? INSERT / REPLACE / REMOVE PACEMAKER  03/24/2016  ? IR KYPHO LUMBAR INC FX REDUCE BONE BX UNI/BIL CANNULATION INC/IMAGING  12/29/2021  ? IR RADIOLOGIST EVAL & MGMT  12/15/2021  ? LEFT AND RIGHT HEART CATHETERIZATION WITH CORONARY ANGIOGRAM  N/A 06/05/2014  ? Procedure: LEFT AND RIGHT HEART CATHETERIZATION WITH CORONARY ANGIOGRAM;  Surgeon: HSinclair Grooms MD;  [facility];  Service: Cardiovascular;  Laterality: N/A;  ? NASAL FRACTURE SURGERY    ? SHOULDER ARTHROSCOPY WITH ROTATOR CUFF REPAIR AND SUBACROMIAL DECOMPRESSION Right 08/16/2013  ? Procedure: SHOULDER ARTHROSCOPY WITH ROTATOR CUFF REPAIR AND SUBACROMIAL DECOMPRESSION;  Surgeon: JNita Sells MD;  [facility]  Service: Orthopedics;  Laterality: Right;  Right shoulder arthroscopy rotator cuff repair, subacromial decompression.  ? TONSILLECTOMY    ? ?Patient Active Problem List  ? Diagnosis Date Noted  ? Debility 01/08/2022  ? Constipation 01/07/2022  ? Physical deconditioning 01/07/2022  ? Acute metabolic encephalopathy [id number] ? Hypomagnesemia 01/04/2022  ? Pancreatitis 12/30/2021  ? Parkinson's disease ([place] 12/21/2021  ? Parkinson disease ([place] 12/18/2021  ? Frequent falls 12/18/2021  ? History of CVA (cerebrovascular accident) 12/18/2021  ? 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 Hallucinations, visual 07/30/2021  ? Acetabulum fracture, left ([place] 06/25/2021  ? Acute hip pain, left 06/24/2021  ? Fall at home, initial encounter 06/24/2021  ? Leukocytosis 06/24/2021  ? Chronic diastolic heart  failure ([place])   ? Sinusitis, acute maxillary 06/24/2020  ? AKI (acute kidney injury) ([place])   ? Labile blood glucose   ? Cough   ? Benign essential HTN   ? Skin lesion of hand   ? Hypoalbuminemia due to protein-calorie malnutrition ([place])   ? Controlled type 2 diabetes mellitus with hyperglycemia, without long-term current use of insulin ([place])   ? 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 Morbid obesity ([place])   ? Acute blood loss anemia   ? Acute thalamic infarction ([place]) 04/15/2020  ? 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 TIA (transient ischemic attack) 02/17/2018  ? Type 2 diabetes mellitus with hyperlipidemia ([place]) 02/17/2018  ? Insomnia  02/01/2018  ? 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 Chronotropic incompetence 07/09/2014  ? Dyspnea on exertion 03/03/2014  ? HLD (hyperlipidemia) 12/13/2007  ? Essential hypertension 12/13/2007  ? Class 1 obesity 12/05/2007  ? 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 Coronary atherosclerosis 12/05/2007  ? ? ?ONSET DATE: Stroke June 2021; Parkinson's Disease January 2022  ? ?REFERRING DIAG: G20 (ICD-10-CM) - Parkinson's disease  ? ?THERAPY DIAG:  ?Dysarthria and anarthria ? ?Cognitive communication deficit ? ?Dysphagia, oropharyngeal phase ? ?SUBJECTIVE:  ? ?SUBJECTIVE STATEMENT: ?"Better than I deserve"  ?Pt accompanied by: self and significant other (wife [name]) ? ?PERTINENT HISTORY: Pt is an 81 yo male adm to [facility] from 2/17 to 2/22 - suspected due to medications - Seroquel was started. PMH of hypertension, noninsulin-dependent type 2 diabetes, CVA, Parkinson's, bradycardia-s/p PPM placement, OSA, narcolepsy. Wife reported to MD that patient has intermittent chronic cough. CT abdomen Lower chest: Small right and trace left pleural effusions new from 10/29/2021. Per MD note, Sepsis present on admission with tachypnea, leukocytosis, lactic acidosis, fever, source of infection likely intra-abdominal - initial CT concerning for Pancreatitis versus proximal sigmoid colon diverticulitis, with elevated lipase, normal LFT. Per prior brain imaging studies -Chronic infarct left frontal lobe anteriorly. Small chronic infarcts in the cerebellum bilaterally. Chronic infarcts right and left thalamic. Complains of cough.  ? ?PAIN:  ?Are you having pain? No ? ?FALLS: Has patient fallen in last 6 months?  Yes, Number of falls: not recently, See PT evaluation for details ? ?LIVING ENVIRONMENT: ?Lives with: lives with their spouse and lives with their son ?Lives in: House/apartment ? ?PLOF:  ?Level of assistance: Needed assistance with ADLs, Needed assistance with  IADLS ?Employment: Retired Automotive engineer ? ?PATIENT GOALS "I want my speech back" ? ?OBJECTIVE:  ? ?DIAGNOSTIC FINDINGS: MBSS (01/04/22) Patient presents with a mild-moderate oral and a mild pharyngeal phase dysphagia. During oral phase, patient with decreased bolus cohesion with barium tablet in puree solids, requiring thin liquid barium sip to transit. He exhibited a munch chewing pattern with graham cracker. He did exhibit decreased anterior to posterior movement of liquids as well but with good oral containment of bolus and no premature spillage in vallecular sinus observed. During pharyngeal phase of swallow, patient exhibited a couple instances of mildly delayed swallow initiation at level of vallecular sinus and trace amount of vallecular residuals observed post initial swallows with thin liquids. No penetration or aspiration observed and cricopharyngeal phase of swallow appeared [facility] with no backflow of barium. Esophageal sweep did not reveal any significant s/s of dysmotility or stasis of barium. Pharyngeal phase of swallow was [facility] for puree solids, barium tablet in puree and regular texture solids. SLP is recommending to upgrade liquids to thin  and continue with regular solids. ? ?COGNITION: ?Overall cognitive status: Impaired ?Attention: Impaired: Sustained, Alternating ?Memory: Impaired: Working ?Short term ?Awareness: Impaired: Emergent and Anticipatory ?Executive function: Impaired: Initiation, Problem solving, and Slow processing ?Behavior: Within functional limits ?Functional deficits: Patient reports memory is "not good" and wife took over management of medication due to recall deficits. Slower processing and initation also reported ? ?MOTOR SPEECH: ?Overall motor speech: impaired ?Level of impairment: Conversation ?Respiration: thoracic breathing and clavicular breathing ?Phonation: low vocal intensity ?Resonance: WFL ?Articulation: Appears intact ?Intelligibility: Intelligibility  reduced ?Motor planning: Appears intact ?Motor speech errors: inconsistent ?Interfering components: hearing loss ?Effective technique: increased vocal intensity and over articulate ? ?ORAL MOTOR ASSESSMENT:   ?WFL ? ? ?OBJECTIV

## 2022-02-02 NOTE — Therapy (Signed)
?OUTPATIENT PHYSICAL THERAPY NEURO EVALUATION ? ? ?Patient Name: Jordan [name] ?MRN: [id number] ?DOB:[birth year], 81 y.o., male ?Today's Date: 02/03/2022 ? ?PCP: [name], MD ?REFERRING PROVIDER: Lauraine Rinne ? ? PT End of Session - 02/02/22 1103   ? ? Visit Number 1   ? Number of Visits 17   ? Date for PT Re-Evaluation 04/02/22   ? Authorization Type Christella Scheuermann medicare so 10th visit progress note. $20 copay per day   ? PT Start Time 1100   ? PT Stop Time 1146   ? PT Time Calculation (min) 46 min   ? Equipment Utilized During Treatment Gait belt   ? Activity Tolerance Patient tolerated treatment well   ? Behavior During Therapy [facility] for tasks assessed/performed   ? ?  ?  ? ?  ? ? ?Past Medical History:  ?Diagnosis Date  ? Arthritis   ? R shoulder, bone spur  ? Arthritis   ? "hands" (03/25/2016)  ? Chronic diastolic heart failure ([place])   ? Chronic lower back pain   ? Coronary heart disease   ? Dr Pernell Dupre  ? H/O cardiovascular stress test   ? perhaps last one was 2009  ? Heart murmur   ? 12/29/11 echo: mild MR, no AS, trivial TR  ? History of gout   ? Hyperlipidemia   ? Hypertension   ? saw Dr. Linard Millers last earl;y- 2014, cardiac cath. last done ?2009, blocks seen didn't require any intervention at that point.  ? Narcolepsy   ? [name] 04-11-97; Mean Latency 1.44mn, SOREM 2  ? OSA on CPAP   ? NPSG 12-13-98 AHI 22.7  ? PONV (postoperative nausea and vomiting)   ? Presence of permanent cardiac pacemaker   ? Shortness of breath   ? with exertion   ? Stroke ([facility]   ? Type II diabetes mellitus ([place]   ? ?Past Surgical History:  ?Procedure Laterality Date  ? CARDIAC CATHETERIZATION  2009?  ? EP IMPLANTABLE DEVICE N/A 03/25/2016  ? Procedure: Pacemaker Implant - Dual Chamber;  Surgeon: GEvans Lance MD;  [facility];  Service: Cardiovascular;  Laterality: N/A;  ? FRACTURE SURGERY    ? INSERT / REPLACE / REMOVE PACEMAKER  03/24/2016  ? IR KYPHO LUMBAR INC FX REDUCE BONE BX UNI/BIL CANNULATION INC/IMAGING   12/29/2021  ? IR RADIOLOGIST EVAL & MGMT  12/15/2021  ? LEFT AND RIGHT HEART CATHETERIZATION WITH CORONARY ANGIOGRAM N/A 06/05/2014  ? Procedure: LEFT AND RIGHT HEART CATHETERIZATION WITH CORONARY ANGIOGRAM;  Surgeon: HSinclair Grooms MD;  [facility];  Service: Cardiovascular;  Laterality: N/A;  ? NASAL FRACTURE SURGERY    ? SHOULDER ARTHROSCOPY WITH ROTATOR CUFF REPAIR AND SUBACROMIAL DECOMPRESSION Right 08/16/2013  ? Procedure: SHOULDER ARTHROSCOPY WITH ROTATOR CUFF REPAIR AND SUBACROMIAL DECOMPRESSION;  Surgeon: JNita Sells MD;  [facility]  Service: Orthopedics;  Laterality: Right;  Right shoulder arthroscopy rotator cuff repair, subacromial decompression.  ? TONSILLECTOMY    ? ?Patient Active Problem List  ? Diagnosis Date Noted  ? Debility 01/08/2022  ? Constipation 01/07/2022  ? Physical deconditioning 01/07/2022  ? Acute metabolic encephalopathy 088/91/6945 ? Hypomagnesemia 01/04/2022  ? Pancreatitis 12/30/2021  ? Parkinson's disease ([place] 12/21/2021  ? Parkinson disease ([place] 12/18/2021  ? Frequent falls 12/18/2021  ? History of CVA (cerebrovascular accident) 12/18/2021  ? 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 Hallucinations, visual 07/30/2021  ? Acetabulum fracture, left ([place] 06/25/2021  ? Acute hip pain, left  06/24/2021  ? Fall at home, initial encounter 06/24/2021  ? Leukocytosis 06/24/2021  ? Chronic diastolic heart failure ([place])   ? Sinusitis, acute maxillary 06/24/2020  ? AKI (acute kidney injury) ([place])   ? Labile blood glucose   ? Cough   ? Benign essential HTN   ? Skin lesion of hand   ? Hypoalbuminemia due to protein-calorie malnutrition ([place])   ? Controlled type 2 diabetes mellitus with hyperglycemia, without long-term current use of insulin ([place])   ? 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 Morbid obesity ([place])   ? Acute blood loss anemia   ? Acute thalamic infarction ([place]) 04/15/2020  ? 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 TIA  (transient ischemic attack) 02/17/2018  ? Type 2 diabetes mellitus with hyperlipidemia ([place]) 02/17/2018  ? Insomnia 02/01/2018  ? 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 Chronotropic incompetence 07/09/2014  ? Dyspnea on exertion 03/03/2014  ? HLD (hyperlipidemia) 12/13/2007  ? Essential hypertension 12/13/2007  ? Class 1 obesity 12/05/2007  ? 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 Coronary atherosclerosis 12/05/2007  ? ? ?ONSET DATE: 12/30/21 ? ?REFERRING DIAG: R53.81 (ICD-10-CM) - Other malaise R53.81 (ICD-10-CM) - Debility G20 (ICD-10-CM) - Parkinson disease ([place])  ? ?THERAPY DIAG:  ?Other abnormalities of gait and mobility ? ?Muscle weakness (generalized) ? ?Unsteadiness on feet ? ?Abnormal posture ? ?SUBJECTIVE:  ?                                                                                                                                                                                           ? ?SUBJECTIVE STATEMENT: ?Pt had a CVA in June of 2021 and then PD diagnosed in January of 2022. Pt reports remaining weakness in right hand from stroke. Pt reports that prior to hospital stay in March he was walking with a walking stick last year. Does have history of chronic low back pain and wife reports that she was noticing a deterioration in his ability to walk safely due to balance and back pain. Had a fall in August of 2022 with a fracture of left hip socket. They did not have to operate but was NWB for several weeks and became further debilitated. Had several falls after that. Compression fracture of L2 vertebrae. On 12/29/21 had a kyphoplasty at L2. Recent admission for gait disorder with hallucinations requiring medication adjustment; who was admitted on 12/30/21 with abdominal pain, low-grade fever, leukocytosis, lactic acidosis, mild confusion and lethargy secondary to sepsis. Had inpatient rehab stay 3/10-3/20/23. Was using a walker prior to hospital  stay and w/c at  times.  ?Pt accompanied by: significant other ? ?PERTINENT HISTORY:  ?PMHx: PD, HLD, OSA, DM, HTN, chronic diastolic HF, CVA, frequent falls, L2 compression fx, L hip fx  ?  Debility ?Active Problems: ?  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  Narcolepsy without cataplexy ?  Presence of permanent cardiac pacemaker ?  Insomnia ?  Type 2 diabetes mellitus with hyperlipidemia ([place]) ?  Hypoalbuminemia due to protein-calorie malnutrition ([place]) ?  REM behavioral disorder ?  Parkinson disease ([place]) ? 81 y.o. male with history of CAD, chronic diastolic CHF, D9RC, OSA, Parkinson's disease, recent admission for gait disorder with hallucinations requiring medication adjustment; who was admitted on 12/30/21 with abdominal pain, low-grade fever, leukocytosis, lactic acidosis, mild confusion and lethargy secondary to sepsis.  Work-up revealed elevated lipase questioned due to primary pancreatitis with secondary inflammation of hepatic flexure of colon or primary diverticulitis with secondary extension to pancreatic tail.  He was treated with IV fluids as well as broad-spectrum antibiotics with improvement in abdominal pain and downward trend in white count.   ?  ?Antibiotics were narrowed to Augmentin with recommendations to complete 2 week course.  Hospital course was significant for issues with volume overload, fluctuating mentation as well as dysphagia related to level of alertness.  He was started on regular diet with thin liquids and aspiration precautions.  As intake improved, blood sugars were noted to be on upward trend.  He continued to have limitations due to disorientation with delay in processing, weakness, poor awareness of deficits and decrease in activity tolerance.  CIR was recommended due to functional decline. ? ?PAIN:  ?Are you having pain? Yes: NPRS scale: 0/10 ?Pain location: low back ?Pain description: aching ?Aggravating factors: prolonged standing, walking ?Relieving factors: rest ?No pain at rest but  when stands up 2/10.  ?PRECAUTIONS: Fall and ICD/Pacemaker, history of compression fracture at L2 with recent kyphoplasty ? ?WEIGHT BEARING RESTRICTIONS No ? ?FALLS: Has patient fallen in last 6 months? Yes. Number

## 2022-02-03 NOTE — Progress Notes (Signed)
? ?Cardiology Office Note ?Date:  02/03/2022  ?Patient ID:  Jordan [name], Jordan [name] Nov 04, 1940, MRN [id number] ?PCP:  [name], MD  ?Cardiologist:  Dr. Tamala Julian (2017) ?Electrophysiologist: Dr. Lovena Le ? ? ?  ?Chief Complaint:  hospital f/u ? ?History of Present Illness: ?Jordan [name] is a 81 y.o. 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 ?He comes in today to be seen for Dr. Lovena Le, last see by him back in July 2020. ? ?Admitted 12/23/21, falls, hallucinations, meds adjusted discharged 12/18/21 ? ?Had back surgery in between hospitalizations ? ?He was admitted to [facility] 12/30/21, abdominal pain, low-grade fever, leukocytosis, lactic acidosis, mild confusion and lethargy secondary to sepsis.  Work-up revealed elevated lipase questioned due to primary pancreatitis with secondary inflammation of hepatic flexure of colon or primary diverticulitis with secondary extension to pancreatic tail.  He was treated with IV fluids as well as broad-spectrum antibiotics, also treated for volume OL during his stay. ?Discharged to CIR and then discharged from there 01/18/22 with instructions for PRN lasix and cardiology follow up ? ? ?BCx2 were both neg x5 days ?TTE noted LVEF 60-65%, no WMA, mild LVH, valves OK ? ?TODAY ?He is accompanied by his wife, comes in a wheel chair though able to get up on t he exam table. ?They report he is having slow but steady improvement ?Remains pretty weak in general ? ?No CP, palpitations or cardiac awareness ?He denies SOB ?No syncope. ? ? ?Device information ?Biotronik dual chamber PPM implanted 03/25/2016 ? ? ?Past Medical History:  ?Diagnosis Date  ? Arthritis   ? R shoulder, bone spur  ? Arthritis   ? "hands"  (03/25/2016)  ? Chronic diastolic heart failure ([place])   ? Chronic lower back pain   ? Coronary heart disease   ? Dr Pernell Dupre  ? H/O cardiovascular stress test   ? perhaps last one was 2009  ? Heart murmur   ? 12/29/11 echo: mild MR, no AS, trivial TR  ? History of gout   ? Hyperlipidemia   ? Hypertension   ? saw Dr. Linard Millers last earl;y- 2014, cardiac cath. last done ?2009, blocks seen didn't require any intervention at that point.  ? Narcolepsy   ? [name] 04-11-97; Mean Latency 1.18mn, SOREM 2  ? OSA on CPAP   ? NPSG 12-13-98 AHI 22.7  ? PONV (postoperative nausea and vomiting)   ? Presence of permanent cardiac pacemaker   ? Shortness of breath   ? with exertion   ? Stroke ([facility]   ? [place] II [place]   ? ? ?Past Surgical History:  ?Procedure Laterality Date  ? CARDIAC CATHETERIZATION  2009?  ? EP IMPLANTABLE DEVICE N/A 03/25/2016  ? Procedure: Pacemaker Implant - Dual Chamber;  Surgeon: GEvans Lance MD;  [facility];  Service: Cardiovascular;  Laterality: N/A;  ? FRACTURE SURGERY    ? INSERT / REPLACE / REMOVE PACEMAKER  03/24/2016  ? IR KYPHO LUMBAR INC FX REDUCE BONE BX UNI/BIL CANNULATION INC/IMAGING  12/29/2021  ? IR RADIOLOGIST EVAL & MGMT  12/15/2021  ? LEFT AND RIGHT HEART CATHETERIZATION WITH CORONARY ANGIOGRAM N/A 06/05/2014  ? Procedure: LEFT AND RIGHT HEART CATHETERIZATION WITH CORONARY ANGIOGRAM;  Surgeon: HMallie Mussel  Carlye Grippe, MD;  Location: [facility] CATH LAB;  Service: Cardiovascular;  Laterality: N/A;  ? NASAL FRACTURE SURGERY    ? SHOULDER ARTHROSCOPY WITH ROTATOR CUFF REPAIR AND SUBACROMIAL DECOMPRESSION Right 08/16/2013  ? Procedure: SHOULDER ARTHROSCOPY WITH ROTATOR CUFF REPAIR AND SUBACROMIAL DECOMPRESSION;  Surgeon: [name], MD;  Location: [facility];  Service: Orthopedics;  Laterality: Right;  Right shoulder arthroscopy rotator cuff repair, subacromial decompression.  ? TONSILLECTOMY    ? ? ?Current Outpatient Medications  ?Medication Sig Dispense Refill  ?  acetaminophen (TYLENOL) 325 MG tablet Take 1-2 tablets (325-650 mg total) by mouth every 4 (four) hours as needed for mild pain.    ? amantadine (SYMMETREL) 100 MG capsule Take 1 capsule (100 mg total) by mouth daily. 30 capsule 0  ? amphetamine-dextroamphetamine (ADDERALL) 10 MG tablet Take 10 mg by mouth daily as needed (help control symptoms of parkinsons). (Patient not taking: Reported on 02/02/2022)    ? atorvastatin (LIPITOR) 40 MG tablet Take 0.5 tablets (20 mg total) by mouth daily. 30 tablet 0  ? bifidobacterium infantis (ALIGN) capsule Take 1 capsule by mouth daily.    ? carbidopa-levodopa (SINEMET) 25-100 MG tablet Take 1 tablet by mouth 3 (three) times daily. 90 tablet 5  ? Carbidopa-Levodopa ER (SINEMET CR) 25-100 MG tablet controlled release Take 1 tablet by mouth at bedtime.    ? cholecalciferol (VITAMIN D3) 25 MCG (1000 UNIT) tablet Take 1,000 Units by mouth daily.    ? clopidogrel (PLAVIX) 75 MG tablet Take 1 tablet (75 mg total) by mouth daily. 30 tablet 11  ? furosemide (LASIX) 20 MG tablet USE AS NEEDED FOR UP TO 5 DAYS IF SWELLING IN LEGS RECURS. CONTACT CARDIOLOGY IS SWELLING DOES NOT IMPROVE OR GETS WORSE. 15 tablet 0  ? lidocaine (LIDODERM) 5 % Place 1 patch onto the skin daily as needed. Purchase over the counter. On for 12 hours and off for 12 hours 30 patch 0  ? magnesium gluconate (MAGONATE) 500 MG tablet Take 0.5 tablets (250 mg total) by mouth at bedtime. 30 tablet 0  ? Melatonin 10 MG TABS Take 10 mg by mouth at bedtime.    ? metFORMIN (GLUCOPHAGE) 1000 MG tablet Take 1 tablet (1,000 mg total) by mouth daily with breakfast. 30 tablet 0  ? metoprolol tartrate (LOPRESSOR) 25 MG tablet Take 1 tablet (25 mg total) by mouth 2 (two) times daily. 60 tablet 0  ? Multiple Vitamin (MULTIVITAMIN WITH MINERALS) TABS tablet Take 1 tablet by mouth daily.    ? NITROSTAT 0.4 MG SL tablet PLACE 1 TABLET (0.4 MG TOTAL) UNDER THE TONGUE EVERY 5 (FIVE) MINUTES AS NEEDED FOR CHEST PAIN. (Patient taking  differently: Place 0.4 mg under the tongue every 5 (five) minutes as needed for chest pain.) 25 tablet 5  ? Omega 3 1200 MG CAPS Take 1,200 mg by mouth daily.    ? polyethylene glycol (MIRALAX / GLYCOLAX) 17 g packet Take 17 g by mouth daily. 14 each 0  ? polyvinyl alcohol (LIQUIFILM TEARS) 1.4 % ophthalmic solution Place 1 drop into both eyes as needed for dry eyes. 15 mL 0  ? PRESCRIPTION MEDICATION See admin instructions. CPAP- At bedtime    ? QUEtiapine (SEROQUEL) 25 MG tablet Take 1/2 tablets (12.5 mg total) by mouth at bedtime. 15 tablet 0  ? senna-docusate (SENOKOT-S) 8.6-50 MG tablet Take 2 tablets by mouth daily with supper. 60 tablet 0  ? Vitamin D, Ergocalciferol, (DRISDOL) 1.25 MG (50000 UNIT) CAPS capsule Take  1 capsule (50,000 Units total) by mouth every 7 (seven) days. 5 capsule 0  ? ?No current facility-administered medications for this visit.  ? ? ?Allergies:   Patient has no known allergies.  ? ?Social History:  The patient  reports that he quit smoking about 54 years ago. His smoking use included cigarettes. He has a 15.00 pack-year smoking history. He has never used smokeless tobacco. He reports current alcohol use. He reports that he does not use drugs.  ? ?Family History:  The patient's family history includes Colon cancer in his sister; Sleep apnea in his sister. ? ?ROS:  Please see the history of present illness.    ?All other systems are reviewed and otherwise negative.  ? ?PHYSICAL EXAM:  ?VS:  There were no vitals taken for this visit. BMI: There is no height or weight on file to calculate BMI. ?Well nourished, well developed, in no acute distress ?HEENT: normocephalic, atraumatic ?Neck: no JVD, carotid bruits or masses ?Cardiac:  RRR; no significant murmurs, no rubs, or gallops ?Lungs:  CTA b/l, no wheezing, rhonchi or rales ?Abd: soft, nontender ?MS: no deformity or atrophy ?Ext: no edema ?Skin: warm and dry, no rash ?Neuro:  No gross deficits appreciated ?Psych: euthymic mood, full  affect ? ?PPM site is stable, no tethering or discomfort ? ? ?EKG:  not done today ? ?Device interrogation done today and reviewed by myself ?Battery and lead measurements are stable ?One AFib episode 4hrs 81mn, rate controlled

## 2022-02-04 ENCOUNTER — Ambulatory Visit: Payer: Medicare (Managed Care) | Admitting: Physician Assistant

## 2022-02-04 ENCOUNTER — Encounter: Payer: Self-pay | Admitting: Physician Assistant

## 2022-02-04 VITALS — BP 108/76 | HR 72 | Ht 69.0 in | Wt 213.0 lb

## 2022-02-04 DIAGNOSIS — I480 Paroxysmal atrial fibrillation: Secondary | ICD-10-CM | POA: Diagnosis not present

## 2022-02-04 DIAGNOSIS — I5032 Chronic diastolic (congestive) heart failure: Secondary | ICD-10-CM

## 2022-02-04 DIAGNOSIS — Z950 Presence of cardiac pacemaker: Secondary | ICD-10-CM

## 2022-02-04 DIAGNOSIS — I10 Essential (primary) hypertension: Secondary | ICD-10-CM

## 2022-02-04 DIAGNOSIS — E119 Type 2 diabetes mellitus without complications: Secondary | ICD-10-CM | POA: Diagnosis not present

## 2022-02-04 NOTE — Patient Instructions (Addendum)
Medication Instructions:  ? ?Your physician recommends that you continue on your current medications as directed. Please refer to the Current Medication list given to you today. ? ?*If you need a refill on your cardiac medications before your next appointment, please call your pharmacy* ? ? ?Lab Work: Organ ? ? ?If you have labs (blood work) drawn today and your tests are completely normal, you will receive your results only by: ?MyChart Message (if you have MyChart) OR ?A paper copy in the mail ?If you have any lab test that is abnormal or we need to change your treatment, we will call you to review the results. ? ? ?Testing/Procedures: NONE ORDERED  TODAY ? ? ? ?Follow-Up: ?At [facility], you and your health needs are our priority.  As part of our continuing mission to provide you with exceptional heart care, we have created designated Provider Care Teams.  These Care Teams include your primary Cardiologist (physician) and Advanced Practice Providers (APPs -  Physician Assistants and Nurse Practitioners) who all work together to provide you with the care you need, when you need it. ? ?We recommend signing up for the patient portal called "MyChart".  Sign up information is provided on this After Visit Summary.  MyChart is used to connect with patients for Virtual Visits (Telemedicine).  Patients are able to view lab/test results, encounter notes, upcoming appointments, etc.  Non-urgent messages can be sent to your provider as well.   ?To learn more about what you can do with MyChart, go to [email or web address].   ? ?Your next appointment:   ? ?4 month(s) ( CONTACT [facility] FOR EP SCHEDULING ISSUES ) ? ? ?The format for your next appointment:   ?In Person ? ?Provider:   ?You may see Dr. Lovena Le  or one of the following Advanced Practice Providers on your designated Care Team:   ?Tommye Standard, PA-C ? ?}  ?Other Instructions: ? ?

## 2022-02-05 ENCOUNTER — Telehealth: Payer: Self-pay

## 2022-02-05 NOTE — Telephone Encounter (Signed)
Attempted to contact patient's spouse [name] to schedule a Palliative Care consult appointment. No answer left a message to return call.  ?

## 2022-02-08 ENCOUNTER — Other Ambulatory Visit: Payer: Self-pay

## 2022-02-08 ENCOUNTER — Other Ambulatory Visit (HOSPITAL_COMMUNITY): Payer: Medicare (Managed Care)

## 2022-02-08 ENCOUNTER — Emergency Department (HOSPITAL_COMMUNITY): Payer: Medicare (Managed Care)

## 2022-02-08 ENCOUNTER — Inpatient Hospital Stay (HOSPITAL_COMMUNITY): Payer: Medicare (Managed Care)

## 2022-02-08 ENCOUNTER — Inpatient Hospital Stay (HOSPITAL_COMMUNITY)
Admission: EM | Admit: 2022-02-08 | Discharge: 2022-02-18 | DRG: 481 | Disposition: A | Discharge status: Skilled Nursing Facility | Payer: Medicare (Managed Care) | Attending: Internal Medicine | Admitting: Internal Medicine

## 2022-02-08 ENCOUNTER — Inpatient Hospital Stay (HOSPITAL_COMMUNITY): Payer: Medicare (Managed Care) | Admitting: Anesthesiology

## 2022-02-08 ENCOUNTER — Encounter (HOSPITAL_COMMUNITY): Payer: Self-pay

## 2022-02-08 ENCOUNTER — Encounter (HOSPITAL_COMMUNITY)
Admission: EM | Disposition: A | Discharge status: Skilled Nursing Facility | Payer: Self-pay | Source: Home / Self Care | Attending: Internal Medicine

## 2022-02-08 DIAGNOSIS — I480 Paroxysmal atrial fibrillation: Secondary | ICD-10-CM | POA: Diagnosis not present

## 2022-02-08 DIAGNOSIS — I2510 Atherosclerotic heart disease of native coronary artery without angina pectoris: Secondary | ICD-10-CM | POA: Diagnosis not present

## 2022-02-08 DIAGNOSIS — Z7982 Long term (current) use of aspirin: Secondary | ICD-10-CM

## 2022-02-08 DIAGNOSIS — E669 Obesity, unspecified: Secondary | ICD-10-CM | POA: Diagnosis present

## 2022-02-08 DIAGNOSIS — I5032 Chronic diastolic (congestive) heart failure: Secondary | ICD-10-CM | POA: Diagnosis present

## 2022-02-08 DIAGNOSIS — I10 Essential (primary) hypertension: Secondary | ICD-10-CM | POA: Diagnosis not present

## 2022-02-08 DIAGNOSIS — Z7984 Long term (current) use of oral hypoglycemic drugs: Secondary | ICD-10-CM | POA: Diagnosis not present

## 2022-02-08 DIAGNOSIS — G20A1 Parkinson's disease without dyskinesia, without mention of fluctuations: Secondary | ICD-10-CM | POA: Diagnosis present

## 2022-02-08 DIAGNOSIS — M6281 Muscle weakness (generalized): Secondary | ICD-10-CM | POA: Diagnosis not present

## 2022-02-08 DIAGNOSIS — J449 Chronic obstructive pulmonary disease, unspecified: Secondary | ICD-10-CM

## 2022-02-08 DIAGNOSIS — G4733 Obstructive sleep apnea (adult) (pediatric): Secondary | ICD-10-CM

## 2022-02-08 DIAGNOSIS — E1165 Type 2 diabetes mellitus with hyperglycemia: Secondary | ICD-10-CM | POA: Diagnosis present

## 2022-02-08 DIAGNOSIS — S72142A Displaced intertrochanteric fracture of left femur, initial encounter for closed fracture: Principal | ICD-10-CM

## 2022-02-08 DIAGNOSIS — W010XXA Fall on same level from slipping, tripping and stumbling without subsequent striking against object, initial encounter: Secondary | ICD-10-CM | POA: Diagnosis present

## 2022-02-08 DIAGNOSIS — R001 Bradycardia, unspecified: Secondary | ICD-10-CM | POA: Diagnosis present

## 2022-02-08 DIAGNOSIS — Z950 Presence of cardiac pacemaker: Secondary | ICD-10-CM | POA: Diagnosis not present

## 2022-02-08 DIAGNOSIS — D72829 Elevated white blood cell count, unspecified: Secondary | ICD-10-CM | POA: Diagnosis present

## 2022-02-08 DIAGNOSIS — G47419 Narcolepsy without cataplexy: Secondary | ICD-10-CM | POA: Diagnosis present

## 2022-02-08 DIAGNOSIS — W19XXXA Unspecified fall, initial encounter: Secondary | ICD-10-CM | POA: Diagnosis not present

## 2022-02-08 DIAGNOSIS — Z9989 Dependence on other enabling machines and devices: Secondary | ICD-10-CM | POA: Diagnosis not present

## 2022-02-08 DIAGNOSIS — I25118 Atherosclerotic heart disease of native coronary artery with other forms of angina pectoris: Secondary | ICD-10-CM | POA: Diagnosis not present

## 2022-02-08 DIAGNOSIS — S72142D Displaced intertrochanteric fracture of left femur, subsequent encounter for closed fracture with routine healing: Secondary | ICD-10-CM | POA: Diagnosis not present

## 2022-02-08 DIAGNOSIS — F02A0 Dementia in other diseases classified elsewhere, mild, without behavioral disturbance, psychotic disturbance, mood disturbance, and anxiety: Secondary | ICD-10-CM | POA: Diagnosis present

## 2022-02-08 DIAGNOSIS — Z8673 Personal history of transient ischemic attack (TIA), and cerebral infarction without residual deficits: Secondary | ICD-10-CM

## 2022-02-08 DIAGNOSIS — G8911 Acute pain due to trauma: Secondary | ICD-10-CM | POA: Diagnosis not present

## 2022-02-08 DIAGNOSIS — Z800 Family history of malignant neoplasm of digestive organs: Secondary | ICD-10-CM | POA: Diagnosis not present

## 2022-02-08 DIAGNOSIS — E7849 Other hyperlipidemia: Secondary | ICD-10-CM | POA: Diagnosis not present

## 2022-02-08 DIAGNOSIS — Z7902 Long term (current) use of antithrombotics/antiplatelets: Secondary | ICD-10-CM

## 2022-02-08 DIAGNOSIS — Z79899 Other long term (current) drug therapy: Secondary | ICD-10-CM

## 2022-02-08 DIAGNOSIS — Y92009 Unspecified place in unspecified non-institutional (private) residence as the place of occurrence of the external cause: Secondary | ICD-10-CM

## 2022-02-08 DIAGNOSIS — G20 Parkinson's disease: Secondary | ICD-10-CM | POA: Diagnosis not present

## 2022-02-08 DIAGNOSIS — Z87891 Personal history of nicotine dependence: Secondary | ICD-10-CM | POA: Diagnosis not present

## 2022-02-08 DIAGNOSIS — R2681 Unsteadiness on feet: Secondary | ICD-10-CM | POA: Diagnosis not present

## 2022-02-08 DIAGNOSIS — I110 Hypertensive heart disease with heart failure: Secondary | ICD-10-CM | POA: Diagnosis present

## 2022-02-08 DIAGNOSIS — E7800 Pure hypercholesterolemia, unspecified: Secondary | ICD-10-CM | POA: Diagnosis not present

## 2022-02-08 DIAGNOSIS — R519 Headache, unspecified: Secondary | ICD-10-CM | POA: Diagnosis not present

## 2022-02-08 DIAGNOSIS — Z743 Need for continuous supervision: Secondary | ICD-10-CM | POA: Diagnosis not present

## 2022-02-08 DIAGNOSIS — Z6833 Body mass index (BMI) 33.0-33.9, adult: Secondary | ICD-10-CM

## 2022-02-08 DIAGNOSIS — M542 Cervicalgia: Secondary | ICD-10-CM | POA: Diagnosis not present

## 2022-02-08 DIAGNOSIS — S72002A Fracture of unspecified part of neck of left femur, initial encounter for closed fracture: Secondary | ICD-10-CM | POA: Diagnosis not present

## 2022-02-08 DIAGNOSIS — Z01818 Encounter for other preprocedural examination: Secondary | ICD-10-CM | POA: Diagnosis not present

## 2022-02-08 DIAGNOSIS — R279 Unspecified lack of coordination: Secondary | ICD-10-CM | POA: Diagnosis not present

## 2022-02-08 DIAGNOSIS — E785 Hyperlipidemia, unspecified: Secondary | ICD-10-CM | POA: Diagnosis present

## 2022-02-08 DIAGNOSIS — M25559 Pain in unspecified hip: Secondary | ICD-10-CM | POA: Diagnosis not present

## 2022-02-08 DIAGNOSIS — R102 Pelvic and perineal pain: Secondary | ICD-10-CM | POA: Diagnosis not present

## 2022-02-08 DIAGNOSIS — M109 Gout, unspecified: Secondary | ICD-10-CM | POA: Diagnosis not present

## 2022-02-08 DIAGNOSIS — I700 Atherosclerosis of aorta: Secondary | ICD-10-CM | POA: Diagnosis not present

## 2022-02-08 DIAGNOSIS — I350 Nonrheumatic aortic (valve) stenosis: Secondary | ICD-10-CM

## 2022-02-08 DIAGNOSIS — S72002D Fracture of unspecified part of neck of left femur, subsequent encounter for closed fracture with routine healing: Secondary | ICD-10-CM | POA: Diagnosis not present

## 2022-02-08 HISTORY — PX: INTRAMEDULLARY (IM) NAIL INTERTROCHANTERIC: SHX5875

## 2022-02-08 LAB — GLUCOSE, CAPILLARY
Glucose-Capillary: 166 mg/dL — ABNORMAL HIGH (ref 70–99)
Glucose-Capillary: 176 mg/dL — ABNORMAL HIGH (ref 70–99)

## 2022-02-08 LAB — CBG MONITORING, ED
Glucose-Capillary: 155 mg/dL — ABNORMAL HIGH (ref 70–99)
Glucose-Capillary: 139 mg/dL — ABNORMAL HIGH (ref 70–99)

## 2022-02-08 LAB — CBC WITH DIFFERENTIAL/PLATELET
Abs Immature Granulocytes: 0.09 10*3/uL — ABNORMAL HIGH (ref 0.00–0.07)
Basophils Absolute: 0.1 10*3/uL (ref 0.0–0.1)
Basophils Relative: 0 %
Eosinophils Absolute: 0.3 10*3/uL (ref 0.0–0.5)
Eosinophils Relative: 2 %
HCT: 41.4 % (ref 39.0–52.0)
Hemoglobin: 13.6 g/dL (ref 13.0–17.0)
Immature Granulocytes: 1 %
Lymphocytes Relative: 18 %
Lymphs Abs: 2.7 10*3/uL (ref 0.7–4.0)
MCH: 30 pg (ref 26.0–34.0)
MCHC: 32.9 g/dL (ref 30.0–36.0)
MCV: 91.2 fL (ref 80.0–100.0)
Monocytes Absolute: 0.9 10*3/uL (ref 0.1–1.0)
Monocytes Relative: 6 %
Neutro Abs: 10.9 10*3/uL — ABNORMAL HIGH (ref 1.7–7.7)
Neutrophils Relative %: 73 %
Platelets: 242 10*3/uL (ref 150–400)
RBC: 4.54 MIL/uL (ref 4.22–5.81)
RDW: 13.5 % (ref 11.5–15.5)
WBC: 14.9 10*3/uL — ABNORMAL HIGH (ref 4.0–10.5)
nRBC: 0 % (ref 0.0–0.2)

## 2022-02-08 LAB — BASIC METABOLIC PANEL
Anion gap: 8 (ref 5–15)
BUN: 24 mg/dL — ABNORMAL HIGH (ref 8–23)
CO2: 27 mmol/L (ref 22–32)
Calcium: 9.5 mg/dL (ref 8.9–10.3)
Chloride: 103 mmol/L (ref 98–111)
Creatinine, Ser: 0.93 mg/dL (ref 0.61–1.24)
GFR, Estimated: >60 mL/min (ref >60)
Glucose, Bld: 175 mg/dL — ABNORMAL HIGH (ref 70–99)
Potassium: 4.4 mmol/L (ref 3.5–5.1)
Sodium: 138 mmol/L (ref 135–145)

## 2022-02-08 LAB — TYPE AND SCREEN
ABO/RH(D): O POS
Antibody Screen: NEGATIVE

## 2022-02-08 LAB — SURGICAL PCR SCREEN
MRSA, PCR: NEGATIVE
Staphylococcus aureus: NEGATIVE

## 2022-02-08 LAB — PROTIME-INR
INR: 1.1 (ref 0.8–1.2)
Prothrombin Time: 13.7 seconds (ref 11.4–15.2)

## 2022-02-08 LAB — ABO/RH: ABO/RH(D): O POS

## 2022-02-08 SURGERY — FIXATION, FRACTURE, INTERTROCHANTERIC, WITH INTRAMEDULLARY ROD
Anesthesia: General | Site: Leg Upper | Laterality: Left

## 2022-02-08 MED ORDER — ONDANSETRON HCL 4 MG/2ML IJ SOLN
INTRAMUSCULAR | Status: DC | PRN
Start: 2022-02-08 — End: 2022-02-08

## 2022-02-08 MED ORDER — VITAMIN D (ERGOCALCIFEROL) 1.25 MG (50000 UNIT) PO CAPS
50000.0000 [IU] | ORAL_CAPSULE | ORAL | Status: DC
  Administered 2022-02-08 – 2022-02-15 (×2): 50000 [IU] via ORAL
  Filled 2022-02-08 (×2): qty 1

## 2022-02-08 MED ORDER — SUGAMMADEX SODIUM 200 MG/2ML IV SOLN
INTRAVENOUS | Status: DC | PRN
  Administered 2022-02-08: 200 mg via INTRAVENOUS

## 2022-02-08 MED ORDER — LIDOCAINE 2% (20 MG/ML) 5 ML SYRINGE
INTRAMUSCULAR | Status: DC | PRN
  Administered 2022-02-08: 20 mg via INTRAVENOUS

## 2022-02-08 MED ORDER — LIDOCAINE 2% (20 MG/ML) 5 ML SYRINGE
INTRAMUSCULAR | Status: AC
  Filled 2022-02-08: qty 5

## 2022-02-08 MED ORDER — FENTANYL CITRATE (PF) 100 MCG/2ML IJ SOLN
INTRAMUSCULAR | Status: AC
  Filled 2022-02-08: qty 2

## 2022-02-08 MED ORDER — HYDROCODONE-ACETAMINOPHEN 5-325 MG PO TABS
1.0000 | ORAL_TABLET | Freq: Four times a day (QID) | ORAL | Status: DC | PRN
  Administered 2022-02-12 (×2): 1 via ORAL
  Administered 2022-02-13 – 2022-02-18 (×7): 2 via ORAL
  Filled 2022-02-08 (×4): qty 2
  Filled 2022-02-08: qty 1
  Filled 2022-02-08 (×3): qty 2
  Filled 2022-02-08: qty 1
  Filled 2022-02-08: qty 2

## 2022-02-08 MED ORDER — CEFAZOLIN SODIUM-DEXTROSE 2-4 GM/100ML-% IV SOLN
INTRAVENOUS | Status: AC
  Filled 2022-02-08: qty 100

## 2022-02-08 MED ORDER — SODIUM CHLORIDE 0.9 % IV SOLN: INTRAVENOUS | Status: AC

## 2022-02-08 MED ORDER — ACETAMINOPHEN 325 MG PO TABS: 650.0000 mg | ORAL_TABLET | ORAL | Status: DC | PRN

## 2022-02-08 MED ORDER — CARBIDOPA-LEVODOPA 25-100 MG PO TABS
1.0000 | ORAL_TABLET | Freq: Three times a day (TID) | ORAL | Status: DC
  Administered 2022-02-08 – 2022-02-18 (×29): 1 via ORAL
  Filled 2022-02-08 (×30): qty 1

## 2022-02-08 MED ORDER — OXYCODONE HCL 5 MG PO TABS
5.0000 mg | ORAL_TABLET | ORAL | Status: DC | PRN
  Administered 2022-02-09: 10 mg via ORAL
  Administered 2022-02-09: 5 mg via ORAL
  Administered 2022-02-10: 10 mg via ORAL
  Administered 2022-02-10: 5 mg via ORAL
  Administered 2022-02-11: 10 mg via ORAL
  Filled 2022-02-08: qty 2
  Filled 2022-02-08 (×2): qty 1
  Filled 2022-02-08 (×2): qty 2

## 2022-02-08 MED ORDER — METOPROLOL TARTRATE 25 MG PO TABS
25.0000 mg | ORAL_TABLET | Freq: Two times a day (BID) | ORAL | Status: DC
  Administered 2022-02-08 – 2022-02-18 (×19): 25 mg via ORAL
  Filled 2022-02-08 (×20): qty 1

## 2022-02-08 MED ORDER — TRAZODONE HCL 50 MG PO TABS
25.0000 mg | ORAL_TABLET | Freq: Every evening | ORAL | Status: DC | PRN
  Administered 2022-02-10: 25 mg via ORAL
  Filled 2022-02-08: qty 1

## 2022-02-08 MED ORDER — ONDANSETRON HCL 4 MG/2ML IJ SOLN
INTRAMUSCULAR | Status: AC
  Filled 2022-02-08: qty 2

## 2022-02-08 MED ORDER — LACTATED RINGERS IV SOLN: INTRAVENOUS | Status: DC

## 2022-02-08 MED ORDER — HYDROMORPHONE HCL 2 MG PO TABS: 1.0000 mg | ORAL_TABLET | ORAL | Status: DC | PRN

## 2022-02-08 MED ORDER — INSULIN ASPART 100 UNIT/ML IJ SOLN
0.0000 [IU] | Freq: Three times a day (TID) | INTRAMUSCULAR | Status: DC
  Administered 2022-02-09: 5 [IU] via SUBCUTANEOUS
  Administered 2022-02-09: 3 [IU] via SUBCUTANEOUS
  Administered 2022-02-09: 5 [IU] via SUBCUTANEOUS

## 2022-02-08 MED ORDER — DOCUSATE SODIUM 100 MG PO CAPS
100.0000 mg | ORAL_CAPSULE | Freq: Two times a day (BID) | ORAL | Status: DC
  Administered 2022-02-08 – 2022-02-18 (×20): 100 mg via ORAL
  Filled 2022-02-08 (×20): qty 1

## 2022-02-08 MED ORDER — CLOPIDOGREL BISULFATE 75 MG PO TABS
75.0000 mg | ORAL_TABLET | Freq: Every day | ORAL | Status: DC
  Administered 2022-02-09 – 2022-02-18 (×10): 75 mg via ORAL
  Filled 2022-02-08 (×11): qty 1

## 2022-02-08 MED ORDER — ADULT MULTIVITAMIN W/MINERALS CH
1.0000 | ORAL_TABLET | Freq: Every day | ORAL | Status: DC
  Administered 2022-02-09 – 2022-02-18 (×10): 1 via ORAL
  Filled 2022-02-08 (×11): qty 1

## 2022-02-08 MED ORDER — AMANTADINE HCL 100 MG PO CAPS
100.0000 mg | ORAL_CAPSULE | Freq: Every day | ORAL | Status: DC
  Administered 2022-02-08 – 2022-02-18 (×11): 100 mg via ORAL
  Filled 2022-02-08 (×11): qty 1

## 2022-02-08 MED ORDER — MELATONIN 5 MG PO TABS
10.0000 mg | ORAL_TABLET | Freq: Every day | ORAL | Status: DC
  Administered 2022-02-08 – 2022-02-17 (×10): 10 mg via ORAL
  Filled 2022-02-08 (×10): qty 2

## 2022-02-08 MED ORDER — TRANEXAMIC ACID-NACL 1000-0.7 MG/100ML-% IV SOLN
INTRAVENOUS | Status: DC | PRN
  Administered 2022-02-08: 1000 mg via INTRAVENOUS

## 2022-02-08 MED ORDER — POVIDONE-IODINE 10 % EX SWAB
2.0000 "application " | Freq: Once | CUTANEOUS | Status: AC
  Administered 2022-02-08: 2 via TOPICAL

## 2022-02-08 MED ORDER — METOCLOPRAMIDE HCL 5 MG/ML IJ SOLN: 5.0000 mg | Freq: Three times a day (TID) | INTRAMUSCULAR | Status: DC | PRN

## 2022-02-08 MED ORDER — ONDANSETRON HCL 4 MG/2ML IJ SOLN: 4.0000 mg | INTRAMUSCULAR | Status: DC | PRN

## 2022-02-08 MED ORDER — CARBIDOPA-LEVODOPA ER 25-100 MG PO TBCR
1.0000 | EXTENDED_RELEASE_TABLET | Freq: Every day | ORAL | Status: DC
  Administered 2022-02-08 – 2022-02-17 (×10): 1 via ORAL
  Filled 2022-02-08 (×12): qty 1

## 2022-02-08 MED ORDER — FENTANYL CITRATE (PF) 100 MCG/2ML IJ SOLN: 25.0000 ug | INTRAMUSCULAR | Status: DC | PRN

## 2022-02-08 MED ORDER — OMEGA-3-ACID ETHYL ESTERS 1 G PO CAPS
1000.0000 mg | ORAL_CAPSULE | Freq: Every day | ORAL | Status: DC
  Administered 2022-02-08 – 2022-02-18 (×11): 1000 mg via ORAL
  Filled 2022-02-08 (×12): qty 1

## 2022-02-08 MED ORDER — QUETIAPINE FUMARATE 25 MG PO TABS
12.5000 mg | ORAL_TABLET | Freq: Every day | ORAL | Status: DC
  Administered 2022-02-08 – 2022-02-17 (×10): 12.5 mg via ORAL
  Filled 2022-02-08 (×11): qty 1

## 2022-02-08 MED ORDER — SENNOSIDES-DOCUSATE SODIUM 8.6-50 MG PO TABS
2.0000 | ORAL_TABLET | Freq: Every day | ORAL | Status: DC
  Administered 2022-02-09 – 2022-02-17 (×9): 2 via ORAL
  Filled 2022-02-08 (×9): qty 2

## 2022-02-08 MED ORDER — POLYETHYLENE GLYCOL 3350 17 G PO PACK
17.0000 g | PACK | Freq: Every day | ORAL | Status: DC
  Administered 2022-02-08 – 2022-02-18 (×11): 17 g via ORAL
  Filled 2022-02-08 (×11): qty 1

## 2022-02-08 MED ORDER — 0.9 % SODIUM CHLORIDE (POUR BTL) OPTIME
TOPICAL | Status: DC | PRN
  Administered 2022-02-08: 1000 mL

## 2022-02-08 MED ORDER — MORPHINE SULFATE (PF) 2 MG/ML IV SOLN: 0.5000 mg | INTRAVENOUS | Status: DC | PRN

## 2022-02-08 MED ORDER — FENTANYL CITRATE (PF) 250 MCG/5ML IJ SOLN
INTRAMUSCULAR | Status: DC | PRN
  Administered 2022-02-08: 25 ug via INTRAVENOUS
  Administered 2022-02-08: 50 ug via INTRAVENOUS

## 2022-02-08 MED ORDER — METHOCARBAMOL 500 MG PO TABS
500.0000 mg | ORAL_TABLET | Freq: Four times a day (QID) | ORAL | Status: DC | PRN
  Administered 2022-02-09 – 2022-02-12 (×5): 500 mg via ORAL
  Filled 2022-02-08 (×5): qty 1

## 2022-02-08 MED ORDER — ATORVASTATIN CALCIUM 10 MG PO TABS
20.0000 mg | ORAL_TABLET | Freq: Every day | ORAL | Status: DC
  Administered 2022-02-08 – 2022-02-18 (×11): 20 mg via ORAL
  Filled 2022-02-08 (×11): qty 2

## 2022-02-08 MED ORDER — ONDANSETRON HCL 4 MG/2ML IJ SOLN
INTRAMUSCULAR | Status: DC | PRN
  Administered 2022-02-08: 4 mg via INTRAVENOUS

## 2022-02-08 MED ORDER — ACETAMINOPHEN 325 MG PO TABS
325.0000 mg | ORAL_TABLET | Freq: Four times a day (QID) | ORAL | Status: DC | PRN
  Administered 2022-02-09 – 2022-02-11 (×2): 650 mg via ORAL
  Filled 2022-02-08 (×2): qty 2

## 2022-02-08 MED ORDER — MORPHINE SULFATE (PF) 4 MG/ML IV SOLN
INTRAVENOUS | Status: AC
Start: 2022-02-08 — End: ?
  Filled 2022-02-08: qty 2

## 2022-02-08 MED ORDER — TRANEXAMIC ACID-NACL 1000-0.7 MG/100ML-% IV SOLN
1000.0000 mg | Freq: Once | INTRAVENOUS | Status: AC
  Administered 2022-02-08: 1000 mg via INTRAVENOUS
  Filled 2022-02-08: qty 100

## 2022-02-08 MED ORDER — RISAQUAD PO CAPS
1.0000 | ORAL_CAPSULE | Freq: Every day | ORAL | Status: DC
  Administered 2022-02-08 – 2022-02-18 (×11): 1 via ORAL
  Filled 2022-02-08 (×11): qty 1

## 2022-02-08 MED ORDER — PHENYLEPHRINE 40 MCG/ML (10ML) SYRINGE FOR IV PUSH (FOR BLOOD PRESSURE SUPPORT)
PREFILLED_SYRINGE | INTRAVENOUS | Status: DC | PRN
  Administered 2022-02-08: 120 ug via INTRAVENOUS
  Administered 2022-02-08: 80 ug via INTRAVENOUS

## 2022-02-08 MED ORDER — PHENYLEPHRINE 40 MCG/ML (10ML) SYRINGE FOR IV PUSH (FOR BLOOD PRESSURE SUPPORT)
PREFILLED_SYRINGE | INTRAVENOUS | Status: AC
  Filled 2022-02-08: qty 10

## 2022-02-08 MED ORDER — NITROGLYCERIN 0.4 MG SL SUBL: 0.4000 mg | SUBLINGUAL_TABLET | SUBLINGUAL | Status: DC | PRN

## 2022-02-08 MED ORDER — BUPIVACAINE HCL (PF) 0.25 % IJ SOLN
INTRAMUSCULAR | Status: AC
  Filled 2022-02-08: qty 20

## 2022-02-08 MED ORDER — LIDOCAINE 5 % EX PTCH: 1.0000 | MEDICATED_PATCH | Freq: Every day | CUTANEOUS | Status: DC | PRN

## 2022-02-08 MED ORDER — METHOCARBAMOL 1000 MG/10ML IJ SOLN
500.0000 mg | Freq: Four times a day (QID) | INTRAVENOUS | Status: DC | PRN
  Filled 2022-02-08: qty 5

## 2022-02-08 MED ORDER — ROCURONIUM BROMIDE 10 MG/ML (PF) SYRINGE
PREFILLED_SYRINGE | INTRAVENOUS | Status: AC
  Filled 2022-02-08: qty 10

## 2022-02-08 MED ORDER — METOCLOPRAMIDE HCL 5 MG PO TABS: 5.0000 mg | ORAL_TABLET | Freq: Three times a day (TID) | ORAL | Status: DC | PRN

## 2022-02-08 MED ORDER — MAGNESIUM HYDROXIDE 400 MG/5ML PO SUSP
30.0000 mL | Freq: Every day | ORAL | Status: DC | PRN
  Filled 2022-02-08: qty 30

## 2022-02-08 MED ORDER — CHLORHEXIDINE GLUCONATE 0.12 % MT SOLN: 15.0000 mL | Freq: Once | OROMUCOSAL | Status: AC

## 2022-02-08 MED ORDER — CHLORHEXIDINE GLUCONATE 0.12 % MT SOLN
OROMUCOSAL | Status: AC
Start: 2022-02-08 — End: 2022-02-08
  Administered 2022-02-08: 15 mL via OROMUCOSAL
  Filled 2022-02-08: qty 15

## 2022-02-08 MED ORDER — PHENYLEPHRINE HCL-NACL 20-0.9 MG/250ML-% IV SOLN
INTRAVENOUS | Status: DC | PRN
  Administered 2022-02-08: 50 ug/min via INTRAVENOUS

## 2022-02-08 MED ORDER — PROPOFOL 10 MG/ML IV BOLUS
INTRAVENOUS | Status: DC | PRN
  Administered 2022-02-08: 50 mg via INTRAVENOUS
  Administered 2022-02-08: 30 mg via INTRAVENOUS

## 2022-02-08 MED ORDER — CHLORHEXIDINE GLUCONATE 4 % EX LIQD: 60.0000 mL | Freq: Once | CUTANEOUS | Status: DC

## 2022-02-08 MED ORDER — SODIUM CHLORIDE 0.9 % IV SOLN: INTRAVENOUS | Status: DC

## 2022-02-08 MED ORDER — FUROSEMIDE 20 MG PO TABS
20.0000 mg | ORAL_TABLET | Freq: Every day | ORAL | Status: DC
  Administered 2022-02-08 – 2022-02-18 (×11): 20 mg via ORAL
  Filled 2022-02-08 (×11): qty 1

## 2022-02-08 MED ORDER — CEFAZOLIN SODIUM-DEXTROSE 2-4 GM/100ML-% IV SOLN
2.0000 g | Freq: Three times a day (TID) | INTRAVENOUS | Status: AC
  Administered 2022-02-08 – 2022-02-09 (×2): 2 g via INTRAVENOUS
  Filled 2022-02-08 (×2): qty 100

## 2022-02-08 MED ORDER — MORPHINE SULFATE 4 MG/ML IJ SOLN
INTRAMUSCULAR | Status: DC | PRN
Start: 2022-02-08 — End: 2022-02-08
  Administered 2022-02-08: 21 mL via INTRAMUSCULAR

## 2022-02-08 MED ORDER — ROCURONIUM BROMIDE 10 MG/ML (PF) SYRINGE
PREFILLED_SYRINGE | INTRAVENOUS | Status: DC | PRN
  Administered 2022-02-08: 70 mg via INTRAVENOUS

## 2022-02-08 MED ORDER — LACTATED RINGERS IV SOLN: INTRAVENOUS | Status: AC

## 2022-02-08 MED ORDER — HYDROMORPHONE HCL 1 MG/ML IJ SOLN
0.5000 mg | INTRAMUSCULAR | Status: DC | PRN
  Administered 2022-02-10: 0.5 mg via INTRAVENOUS
  Filled 2022-02-08: qty 0.5

## 2022-02-08 MED ORDER — MORPHINE SULFATE (PF) 4 MG/ML IV SOLN
4.0000 mg | Freq: Once | INTRAVENOUS | Status: AC
  Administered 2022-02-08: 4 mg via INTRAVENOUS
  Filled 2022-02-08: qty 1

## 2022-02-08 MED ORDER — TRANEXAMIC ACID-NACL 1000-0.7 MG/100ML-% IV SOLN
INTRAVENOUS | Status: AC
  Filled 2022-02-08: qty 100

## 2022-02-08 MED ORDER — ACETAMINOPHEN 500 MG PO TABS
1000.0000 mg | ORAL_TABLET | Freq: Four times a day (QID) | ORAL | Status: AC
  Administered 2022-02-08 – 2022-02-09 (×4): 1000 mg via ORAL
  Filled 2022-02-08 (×4): qty 2

## 2022-02-08 MED ORDER — POLYVINYL ALCOHOL 1.4 % OP SOLN
1.0000 [drp] | OPHTHALMIC | Status: DC | PRN
  Filled 2022-02-08: qty 15

## 2022-02-08 MED ORDER — FENTANYL CITRATE (PF) 250 MCG/5ML IJ SOLN
INTRAMUSCULAR | Status: AC
  Filled 2022-02-08: qty 5

## 2022-02-08 MED ORDER — INSULIN ASPART 100 UNIT/ML IJ SOLN: 0.0000 [IU] | INTRAMUSCULAR | Status: DC | PRN

## 2022-02-08 MED ORDER — DEXAMETHASONE SODIUM PHOSPHATE 10 MG/ML IJ SOLN
INTRAMUSCULAR | Status: DC | PRN
  Administered 2022-02-08: 5 mg via INTRAVENOUS

## 2022-02-08 MED ORDER — VITAMIN D 25 MCG (1000 UNIT) PO TABS
1000.0000 [IU] | ORAL_TABLET | Freq: Every day | ORAL | Status: DC
  Administered 2022-02-08 – 2022-02-18 (×11): 1000 [IU] via ORAL
  Filled 2022-02-08 (×11): qty 1

## 2022-02-08 MED ORDER — FENTANYL CITRATE (PF) 100 MCG/2ML IJ SOLN
50.0000 ug | Freq: Once | INTRAMUSCULAR | Status: AC
  Administered 2022-02-08: 50 ug via INTRAVENOUS

## 2022-02-08 MED ORDER — ORAL CARE MOUTH RINSE: 15.0000 mL | Freq: Once | OROMUCOSAL | Status: AC

## 2022-02-08 MED ORDER — CLONIDINE HCL (ANALGESIA) 100 MCG/ML EP SOLN
EPIDURAL | Status: AC
  Filled 2022-02-08: qty 10

## 2022-02-08 MED ORDER — HYDRALAZINE HCL 20 MG/ML IJ SOLN: 10.0000 mg | INTRAMUSCULAR | Status: DC | PRN

## 2022-02-08 MED ORDER — CEFAZOLIN SODIUM-DEXTROSE 2-4 GM/100ML-% IV SOLN
2.0000 g | INTRAVENOUS | Status: AC
  Administered 2022-02-08: 2 g via INTRAVENOUS
  Filled 2022-02-08: qty 100

## 2022-02-08 MED ORDER — MAGNESIUM GLUCONATE 500 MG PO TABS
250.0000 mg | ORAL_TABLET | Freq: Every day | ORAL | Status: DC
  Administered 2022-02-08 – 2022-02-17 (×10): 250 mg via ORAL
  Filled 2022-02-08 (×11): qty 1

## 2022-02-08 MED ORDER — DEXAMETHASONE SODIUM PHOSPHATE 10 MG/ML IJ SOLN
INTRAMUSCULAR | Status: AC
  Filled 2022-02-08: qty 1

## 2022-02-08 SURGICAL SUPPLY — 40 items
BAG COUNTER SPONGE SURGICOUNT (BAG) ×2 IMPLANT
BIT DRILL INTERTAN LAG SCREW (BIT) ×1 IMPLANT
BLADE CLIPPER SURG (BLADE) IMPLANT
BLADE SURG 15 STRL LF DISP TIS (BLADE) ×1 IMPLANT
BLADE SURG 15 STRL SS (BLADE) ×2
COVER PERINEAL POST (MISCELLANEOUS) ×2 IMPLANT
COVER SURGICAL LIGHT HANDLE (MISCELLANEOUS) ×2 IMPLANT
DRAPE HALF SHEET 40X57 (DRAPES) IMPLANT
DRAPE STERI IOBAN 125X83 (DRAPES) ×2 IMPLANT
DRSG AQUACEL AG 3.5X4 (GAUZE/BANDAGES/DRESSINGS) ×1 IMPLANT
DURAPREP 26ML APPLICATOR (WOUND CARE) ×2 IMPLANT
ELECT REM PT RETURN 9FT ADLT (ELECTROSURGICAL) ×2
ELECTRODE REM PT RTRN 9FT ADLT (ELECTROSURGICAL) ×1 IMPLANT
FACESHIELD WRAPAROUND (MASK) ×2 IMPLANT
FACESHIELD WRAPAROUND OR TEAM (MASK) ×1 IMPLANT
GLOVE SRG 8 PF TXTR STRL LF DI (GLOVE) ×1 IMPLANT
GLOVE SURG LTX SZ8 (GLOVE) ×2 IMPLANT
GLOVE SURG UNDER POLY LF SZ8 (GLOVE) ×2
GOWN STRL REUS W/ TWL LRG LVL3 (GOWN DISPOSABLE) ×1 IMPLANT
GOWN STRL REUS W/ TWL XL LVL3 (GOWN DISPOSABLE) IMPLANT
GOWN STRL REUS W/TWL LRG LVL3 (GOWN DISPOSABLE) ×2
GOWN STRL REUS W/TWL XL LVL3 (GOWN DISPOSABLE)
GUIDE PIN 3.2X343 (PIN) ×2
GUIDE PIN 3.2X343MM (PIN) ×4
KIT BASIN OR (CUSTOM PROCEDURE TRAY) ×2 IMPLANT
KIT TURNOVER KIT B (KITS) ×2 IMPLANT
MANIFOLD NEPTUNE II (INSTRUMENTS) ×2 IMPLANT
NAIL TRIGEN 10MMX40CM-125 LEFT (Nail) ×1 IMPLANT
NS IRRIG 1000ML POUR BTL (IV SOLUTION) ×2 IMPLANT
PACK GENERAL/GYN (CUSTOM PROCEDURE TRAY) ×2 IMPLANT
PAD ARMBOARD 7.5X6 YLW CONV (MISCELLANEOUS) ×4 IMPLANT
PIN GUIDE 3.2X343MM (PIN) IMPLANT
SCREW LAG COMPR KIT 100/95 (Screw) ×1 IMPLANT
SPONGE T-LAP 4X18 ~~LOC~~+RFID (SPONGE) IMPLANT
STAPLER VISISTAT 35W (STAPLE) ×2 IMPLANT
SUT ETHILON 2 0 FS 18 (SUTURE) IMPLANT
SUT VIC AB 2-0 CTB1 (SUTURE) ×2 IMPLANT
TAPE STRIPS DRAPE STRL (GAUZE/BANDAGES/DRESSINGS) IMPLANT
TOWEL GREEN STERILE (TOWEL DISPOSABLE) ×2 IMPLANT
TOWEL GREEN STERILE FF (TOWEL DISPOSABLE) ×2 IMPLANT

## 2022-02-08 NOTE — Assessment & Plan Note (Signed)
On admission blood pressure appears relatively controlled.  Home medication regimen includes metoprolol 25 mg twice daily and furosemide 20 mg daily. ?-Continue home regimen as tolerated ?

## 2022-02-08 NOTE — Plan of Care (Signed)
  Problem: Coping: Goal: Level of anxiety will decrease Outcome: Progressing   Problem: Pain Managment: Goal: General experience of comfort will improve Outcome: Progressing   Problem: Safety: Goal: Ability to remain free from injury will improve Outcome: Progressing   

## 2022-02-08 NOTE — Assessment & Plan Note (Addendum)
During prior hospitalization patient had noted to have hallucinations. ?-Delirium precautions ?-Continue Sinemet, amantadine, and Seroquel   ?

## 2022-02-08 NOTE — Transfer of Care (Signed)
Immediate Anesthesia Transfer of Care Note ? ?Patient: Jordan [name] ? ?Procedure(s) Performed: INTRAMEDULLARY (IM) NAIL INTERTROCHANTRIC (Left: Leg Upper) ? ?Patient Location: PACU ? ?Anesthesia Type:General ? ?Level of Consciousness: drowsy ? ?Airway & Oxygen Therapy: Patient Spontanous Breathing and Patient connected to face mask oxygen ? ?Post-op Assessment: Report given to RN and Post -op Vital signs reviewed and stable ? ?Post vital signs: Reviewed and stable ? ?Last Vitals:  ?Vitals Value Taken Time  ?BP 92/42 02/08/22 1652  ?Temp    ?Pulse 70 02/08/22 1654  ?Resp 19 02/08/22 1654  ?SpO2 97 % 02/08/22 1654  ?Vitals shown include unvalidated device data. ? ?Last Pain:  ?Vitals:  ? 02/08/22 1321  ?TempSrc: Oral  ?PainSc: 0-No pain  ?   ? ?  ? ?Complications: No notable events documented. ?

## 2022-02-08 NOTE — Progress Notes (Signed)
Patient has home CPAP. Patient's wife stated she would help him with putting it on. RT instructed her to have RT called if assistance is needed. RT will monitor. ?

## 2022-02-08 NOTE — Assessment & Plan Note (Deleted)
Patient well controlled.  Last hemoglobin A1c 6.1 on 3/2.  On admission glucose 175.  Home medication regimen includes metformin 1000 mg daily, atorvastatin 20 mg daily, and Lovaza 1000 mg daily. ?-Hypoglycemic protocols ?-Hold metformin ?-Continue atorvastatin and Lovaza ?-CBGs before every meal with sensitive SSI ?-Adjust insulin regimen as needed ?

## 2022-02-08 NOTE — Assessment & Plan Note (Addendum)
Patient was noted during last hospitalization and being in atrial fibrillation for 4 hours, but thought to be in the setting of infection.  No other events were reported.  He was not started on anticoagulation at that time. ?-Continue to monitor ?

## 2022-02-08 NOTE — ED Triage Notes (Signed)
Pt had mechanical fall onto left hip and is having hip pain 7/10. History of left hip fracture. No shortening or rotation noted. Pulses +! ?

## 2022-02-08 NOTE — ED Notes (Signed)
Patient wanted Cpapso resp came  ?

## 2022-02-08 NOTE — Consult Note (Signed)
Reason for Consult:Left hip fx ?Referring Physician: Fuller Plan ?Time called: 1043 ?Time at bedside: 1111 ? ? ?Jordan [name] is an 81 y.o. male.  ?HPI: Encarnacion was at home trying to pick up some batteries, lost his balance, and fell. He had immediate left hip pain and could not get up. He was brought to the ED where x-rays showed a left hip fx and orthopedic surgery was consulted. He lives at home with his wife and generally ambulates with the aid of a RW. ? ?Past Medical History:  ?Diagnosis Date  ? Arthritis   ? R shoulder, bone spur  ? Arthritis   ? "hands" (03/25/2016)  ? Chronic diastolic heart failure ([place])   ? Chronic lower back pain   ? Coronary heart disease   ? Dr Pernell Dupre  ? H/O cardiovascular stress test   ? perhaps last one was 2009  ? Heart murmur   ? 12/29/11 echo: mild MR, no AS, trivial TR  ? History of gout   ? Hyperlipidemia   ? Hypertension   ? saw Dr. Linard Millers last earl;y- 2014, cardiac cath. last done ?2009, blocks seen didn't require any intervention at that point.  ? Narcolepsy   ? [name] 04-11-97; Mean Latency 1.36mn, SOREM 2  ? OSA on CPAP   ? NPSG 12-13-98 AHI 22.7  ? PONV (postoperative nausea and vomiting)   ? Presence of permanent cardiac pacemaker   ? Shortness of breath   ? with exertion   ? Stroke ([facility]   ? [place] ([place]   ? ? ?Past Surgical History:  ?Procedure Laterality Date  ? CARDIAC CATHETERIZATION  2009?  ? EP IMPLANTABLE DEVICE N/A 03/25/2016  ? Procedure: Pacemaker Implant - Dual Chamber;  Surgeon: GEvans Lance MD;  [facility];  Service: Cardiovascular;  Laterality: N/A;  ? FRACTURE SURGERY    ? INSERT / REPLACE / REMOVE PACEMAKER  03/24/2016  ? IR KYPHO LUMBAR INC FX REDUCE BONE BX UNI/BIL CANNULATION INC/IMAGING  12/29/2021  ? IR RADIOLOGIST EVAL & MGMT  12/15/2021  ? LEFT AND RIGHT HEART CATHETERIZATION WITH CORONARY ANGIOGRAM N/A 06/05/2014  ? Procedure: LEFT AND RIGHT HEART CATHETERIZATION WITH CORONARY ANGIOGRAM;  Surgeon: HSinclair Grooms MD;  [facility];  Service: Cardiovascular;  Laterality: N/A;  ? NASAL FRACTURE SURGERY    ? SHOULDER ARTHROSCOPY WITH ROTATOR CUFF REPAIR AND SUBACROMIAL DECOMPRESSION Right 08/16/2013  ? Procedure: SHOULDER ARTHROSCOPY WITH ROTATOR CUFF REPAIR AND SUBACROMIAL DECOMPRESSION;  Surgeon: JNita Sells MD;  [place]  Service: Orthopedics;  Laterality: Right;  Right shoulder arthroscopy rotator cuff repair, subacromial decompression.  ? TONSILLECTOMY    ? ? ?Family History  ?Problem Relation Age of Onset  ? Sleep apnea Sister   ? Colon cancer Sister   ? ? ?Social History:  reports that he quit smoking about 54 years ago. His smoking use included cigarettes. He has a 15.00 pack-year smoking history. He has never used smokeless tobacco. He reports current alcohol use. He reports that he does not use drugs. ? ?Allergies: No Known Allergies ? ?Medications: I have reviewed the patient's current medications. ? ?Results for orders placed or performed during the hospital encounter of 02/08/22 (from the past 48 hour(s))  ?Basic metabolic panel     Status: Abnormal  ? Collection Time: 02/08/22  4:08 AM  ?Result Value Ref Range  ? Sodium 138 135 - 145 mmol/L  ? Potassium 4.4 3.5 -  5.1 mmol/L  ? Chloride 103 98 - 111 mmol/L  ? CO2 27 22 - 32 mmol/L  ? Glucose, Bld 175 (H) 70 - 99 mg/dL  ?  Comment: Glucose reference range applies only to samples taken after fasting for at least 8 hours.  ? BUN 24 (H) 8 - 23 mg/dL  ? Creatinine, Ser 0.93 0.61 - 1.24 mg/dL  ? Calcium 9.5 8.9 - 10.3 mg/dL  ? GFR, Estimated >60 >60 mL/min  ?  Comment: (NOTE) ?Calculated using the CKD-EPI Creatinine Equation (2021) ?  ? Anion gap 8 5 - 15  ?  Comment: Performed at [facility], [place] [address] [place], [place] [address]  ?CBC WITH DIFFERENTIAL     Status: Abnormal  ? Collection Time: 02/08/22  4:08 AM  ?Result Value Ref Range  ? WBC 14.9 (H) 4.0 - 10.5 K/uL  ? RBC 4.54 4.22 - 5.81 MIL/uL  ? Hemoglobin 13.6 13.0 -  17.0 g/dL  ? HCT 41.4 39.0 - 52.0 %  ? MCV 91.2 80.0 - 100.0 fL  ? MCH 30.0 26.0 - 34.0 pg  ? MCHC 32.9 30.0 - 36.0 g/dL  ? RDW 13.5 11.5 - 15.5 %  ? Platelets 242 150 - 400 K/uL  ? nRBC 0.0 0.0 - 0.2 %  ? Neutrophils Relative % 73 %  ? Neutro Abs 10.9 (H) 1.7 - 7.7 K/uL  ? Lymphocytes Relative 18 %  ? Lymphs Abs 2.7 0.7 - 4.0 K/uL  ? Monocytes Relative 6 %  ? Monocytes Absolute 0.9 0.1 - 1.0 K/uL  ? Eosinophils Relative 2 %  ? Eosinophils Absolute 0.3 0.0 - 0.5 K/uL  ? Basophils Relative 0 %  ? Basophils Absolute 0.1 0.0 - 0.1 K/uL  ? Immature Granulocytes 1 %  ? Abs Immature Granulocytes 0.09 (H) 0.00 - 0.07 K/uL  ?  Comment: Performed at [facility], [place] [address] [place], [place] [address]  ?Protime-INR     Status: None  ? Collection Time: 02/08/22  4:08 AM  ?Result Value Ref Range  ? Prothrombin Time 13.7 11.4 - 15.2 seconds  ? INR 1.1 0.8 - 1.2  ?  Comment: (NOTE) ?INR goal varies based on device and disease states. ?Performed at [facility], [place] [address], [place], [place] ?[address] ?  ?Type and screen [place]     Status: None  ? Collection Time: 02/08/22  4:10 AM  ?Result Value Ref Range  ? ABO/RH(D) O POS   ? Antibody Screen NEG   ? Sample Expiration    ?  02/11/2022,2359 ?Performed at [facility], [place] [address], [place], [place] [address] ?  ?CBG monitoring, ED     Status: Abnormal  ? Collection Time: 02/08/22  8:44 AM  ?Result Value Ref Range  ? Glucose-Capillary 155 (H) 70 - 99 mg/dL  ?  Comment: Glucose reference range applies only to samples taken after fasting for at least 8 hours.  ? Comment 1 Notify RN   ? Comment 2 Document in Chart   ? ? ?CT Head Wo Contrast ? ?Result Date: 02/08/2022 ?CLINICAL DATA:  Recent fall with headaches and neck pain, initial encounter EXAM: CT HEAD WITHOUT CONTRAST CT CERVICAL SPINE WITHOUT CONTRAST TECHNIQUE: Multidetector CT imaging of the head and cervical spine was performed following the standard protocol without  intravenous contrast. Multiplanar CT image reconstructions of the cervical spine were also generated. RADIATION DOSE REDUCTION: This exam was performed according  to the departmental dose-optimization program which includes automated exposure control, adjustment of the mA and/or kV according to patient size and/or use of iterative reconstruction technique. COMPARISON:  None. FINDINGS: CT HEAD FINDINGS Brain: No evidence of acute infarction, hemorrhage, hydrocephalus, extra-axial collection or mass lesion/mass effect. Atrophic and chronic white matter ischemic changes are noted. Old right occipital infarct is noted. Similar findings are noted in left frontal lobe. Vascular: No hyperdense vessel or unexpected calcification. Skull: Normal. Negative for fracture or focal lesion. Sinuses/Orbits: Orbits and their contents are within normal limits. Mucosal thickening in the left maxillary antrum is noted. Other: None CT CERVICAL SPINE FINDINGS Alignment: Loss of normal cervical lordosis is noted Skull base and vertebrae: 7 cervical segments are well visualized. Vertebral body height is well maintained. Multilevel osteophytic changes and facet hypertrophic changes are seen. No acute fracture or acute facet abnormality is noted. Soft tissues and spinal canal: Surrounding soft tissue structures show no vascular calcifications. No other focal abnormality is seen. Upper chest: Visualized lung apices are within normal limits. Other: None IMPRESSION: CT of the head: Chronic atrophic and ischemic changes without acute abnormality. Mucosal thickening in the left maxillary antrum. CT of the cervical spine: Multilevel degenerative change without acute abnormality. Electronically Signed   By: Inez Catalina M.D.   On: 02/08/2022 02:24  ? ?CT Cervical Spine Wo Contrast ? ?Result Date: 02/08/2022 ?CLINICAL DATA:  Recent fall with headaches and neck pain, initial encounter EXAM: CT HEAD WITHOUT CONTRAST CT CERVICAL SPINE WITHOUT CONTRAST  TECHNIQUE: Multidetector CT imaging of the head and cervical spine was performed following the standard protocol without intravenous contrast. Multiplanar CT image reconstructions of the cervical spine were also

## 2022-02-08 NOTE — Anesthesia Pain Management Evaluation Note (Signed)
?Anesthesia Pain Consult Note ? ?Patient: Jordan [name], 81 y.o., male ? ?Consult Requested by: Norval Morton, MD ? ?Reason for Consult: hip fracture ? ?Level of Consciousness: alert ? ?Pain: mild with rest, moderate with motion  ? ?Last Vitals:  ?Vitals:  ? 02/08/22 0630 02/08/22 0645  ?BP: 136/72 130/63  ?Pulse: 66 66  ?Resp:  20  ?Temp:    ?SpO2: 92% 93%  ? ? ?Plan: Peripheral nerve block for pain control ? ?Risks of wet tap, epidural hematoma and spinal cord injury explained to:  ? ?Consent:Risks of procedure as well as the alternatives and risks of each were explained to the (patient/caregiver).  Consent for procedure obtained. ? ? ?No Known Allergies ? ?Physical exam: ?PULM normal  ?CARDIO Heart regular rate and rhythm  ?OTHER   ? ?I have reviewed the patient's medications listed below. ?? acidophilus  1 capsule Oral Daily  ?? amantadine  100 mg Oral Daily  ?? atorvastatin  20 mg Oral Daily  ?? carbidopa-levodopa  1 tablet Oral TID  ?? Carbidopa-Levodopa ER  1 tablet Oral QHS  ?? cholecalciferol  1,000 Units Oral Daily  ?? furosemide  20 mg Oral Daily  ?? insulin aspart  0-9 Units Subcutaneous TID WC  ?? magnesium gluconate  250 mg Oral QHS  ?? melatonin  10 mg Oral QHS  ?? metoprolol tartrate  25 mg Oral BID  ?? multivitamin with minerals  1 tablet Oral Daily  ?? omega-3 acid ethyl esters  1,000 mg Oral Daily  ?? polyethylene glycol  17 g Oral Daily  ?? QUEtiapine  12.5 mg Oral QHS  ?? senna-docusate  2 tablet Oral Q supper  ?? Vitamin D (Ergocalciferol)  50,000 Units Oral Q7 days  ? ?? sodium chloride    ? ?acetaminophen, hydrALAZINE, HYDROcodone-acetaminophen, lidocaine, magnesium hydroxide, morphine injection, nitroGLYCERIN, ondansetron (ZOFRAN) IV, polyvinyl alcohol, traZODone ? ?Past Medical History:  ?Diagnosis Date  ?? Arthritis   ? R shoulder, bone spur  ?? Arthritis   ? "hands" (03/25/2016)  ?? Chronic diastolic heart failure ([place])   ?? Chronic lower back pain   ?? Coronary heart disease   ? Dr  Pernell Dupre  ?? H/O cardiovascular stress test   ? perhaps last one was 2009  ?? Heart murmur   ? 12/29/11 echo: mild MR, no AS, trivial TR  ?? History of gout   ?? Hyperlipidemia   ?? Hypertension   ? saw Dr. Linard Millers last earl;y- 2014, cardiac cath. last done ?2009, blocks seen didn't require any intervention at that point.  ?? Narcolepsy   ? [name] 04-11-97; Mean Latency 1.28mn, SOREM 2  ?? OSA on CPAP   ? NPSG 12-13-98 AHI 22.7  ?? PONV (postoperative nausea and vomiting)   ?? Presence of permanent cardiac pacemaker   ?? Shortness of breath   ? with exertion   ?? Stroke ([facility]   ?? [place]   ? ?Past Surgical History:  ?Procedure Laterality Date  ?? CARDIAC CATHETERIZATION  2009?  ?? EP IMPLANTABLE DEVICE N/A 03/25/2016  ? Procedure: Pacemaker Implant - Dual Chamber;  Surgeon: GEvans Lance MD;  [facility];  Service: Cardiovascular;  Laterality: N/A;  ?? FRACTURE SURGERY    ?? INSERT / REPLACE / REMOVE PACEMAKER  03/24/2016  ?? IR KYPHO LUMBAR INC FX REDUCE BONE BX UNI/BIL CANNULATION INC/IMAGING  12/29/2021  ?? IR RADIOLOGIST EVAL & MGMT  12/15/2021  ?? LEFT AND RIGHT HEART CATHETERIZATION  WITH CORONARY ANGIOGRAM N/A 06/05/2014  ? Procedure: LEFT AND RIGHT HEART CATHETERIZATION WITH CORONARY ANGIOGRAM;  Surgeon: Sinclair Grooms, MD;  Location: [facility] CATH LAB;  Service: Cardiovascular;  Laterality: N/A;  ?? NASAL FRACTURE SURGERY    ?? SHOULDER ARTHROSCOPY WITH ROTATOR CUFF REPAIR AND SUBACROMIAL DECOMPRESSION Right 08/16/2013  ? Procedure: SHOULDER ARTHROSCOPY WITH ROTATOR CUFF REPAIR AND SUBACROMIAL DECOMPRESSION;  Surgeon: [name], MD;  Location: [facility];  Service: Orthopedics;  Laterality: Right;  Right shoulder arthroscopy rotator cuff repair, subacromial decompression.  ?? TONSILLECTOMY    ? ? reports that he quit smoking about 54 years ago. His smoking use included cigarettes. He has a 15.00 pack-year smoking history. He has never used smokeless tobacco. He  reports current alcohol use. He reports that he does not use drugs.  ? ? ?Lidia Collum ?02/08/2022 ? ? ? ? ?

## 2022-02-08 NOTE — Assessment & Plan Note (Signed)
Acute.  Patient presents after having fallen at home found to have acute mildly displaced intertrochanteric left femur fracture on imaging.  Case was discussed with Dr. Marlou Sa who is undergoing surgery this afternoon. ?-Admit to a surgery telemetry bed ?-Hip fracture order set utilized ?-N.p.o. prior to surgery with orders placed to advance diet on procedure ?-Nonweightbearing on affected leg ?-Hydrocodone/morphine as needed for moderate to severe pain respectively ?-Bowel regimen ?-Appreciate orthopedic consultative services, will follow-up for any further recommendations ?

## 2022-02-08 NOTE — Plan of Care (Signed)

## 2022-02-08 NOTE — Assessment & Plan Note (Signed)
Acute.  WBC elevated 14.9.    Chest x-ray did not note any acute abnormalities. Suspect secondary to acute fracture. ?-Check urinalysis ?-Recheck CBC tomorrow morning ?

## 2022-02-08 NOTE — Progress Notes (Signed)
Intertrochanteric fracture noted on the left-hand side and ambulatory patient who sustained a fall. ?Plan for surgery today in the late afternoon ?N.p.o. and no anticoagulants ?Full consult to follow ?

## 2022-02-08 NOTE — Anesthesia Postprocedure Evaluation (Signed)
Anesthesia Post Note ? ?Patient: Jordan [name] ? ?Procedure(s) Performed: INTRAMEDULLARY (IM) NAIL INTERTROCHANTRIC (Left: Leg Upper) ? ?  ? ?Patient location during evaluation: PACU ?Anesthesia Type: General ?Level of consciousness: awake and alert, patient cooperative and confused (remains confused, as was pre-op) ?Pain management: pain level controlled ?Vital Signs Assessment: post-procedure vital signs reviewed and stable ?Respiratory status: spontaneous breathing, nonlabored ventilation, respiratory function stable and patient connected to nasal cannula oxygen ?Cardiovascular status: blood pressure returned to baseline and stable ?Postop Assessment: no apparent nausea or vomiting ?Anesthetic complications: no ? ? ?No notable events documented. ? ?Last Vitals:  ?Vitals:  ? 02/08/22 1720 02/08/22 1735  ?BP: 95/74 (!) 98/43  ?Pulse: 78 65  ?Resp: 13 18  ?Temp:    ?SpO2: 100% 98%  ?  ?Last Pain:  ?Vitals:  ? 02/08/22 1735  ?TempSrc:   ?PainSc: Asleep  ? ? ?  ?  ?  ?  ?  ?  ? ?Jordan [name],E. Kavontae Pritchard ? ? ? ? ?

## 2022-02-08 NOTE — Assessment & Plan Note (Addendum)
-  Continue Plavix and statin 

## 2022-02-08 NOTE — Anesthesia Preprocedure Evaluation (Addendum)
Anesthesia Evaluation  ?Patient identified by MRN, date of birth, ID band ?Patient awake and Patient confused ? ? ? ?Reviewed: ?Allergy & Precautions, NPO status , Patient's Chart, lab work & pertinent test results, reviewed documented beta blocker date and time  ? ?History of Anesthesia Complications ?(+) PONV and history of anesthetic complications ? ?Airway ?Mallampati: II ? ?TM Distance: >3 FB ?Neck ROM: Full ? ? ? Dental ? ?(+) Dental Advisory Given ?  ?Pulmonary ?sleep apnea and Continuous Positive Airway Pressure Ventilation , COPD, former smoker,  ?  ?breath sounds clear to auscultation ? ? ? ? ? ? Cardiovascular ?hypertension, Pt. on medications and Pt. on home beta blockers ?+ pacemaker + Valvular Problems/Murmurs (mild) AS  ?Rhythm:Regular Rate:Normal ? ?12/2021 ECHO: EF 60-65%. The LV has normal function,  no regional wall motion abnormalities,  mild concentric LVH. RVF is low normal. The mitral valve is grossly normal. Trivial MR. No MS, Moderate mitral annular  ?calcification. AI is trivial. Mild AS ?  ?Neuro/Psych ?Parkinson's ?TIACVA (R sided weakness), Residual Symptoms   ? GI/Hepatic ?Neg liver ROS, diverticulitis ?  ?Endo/Other  ?diabetes (glu 139), Oral Hypoglycemic Agents ? Renal/GU ?Renal InsufficiencyRenal disease  ? ?  ?Musculoskeletal ? ?(+) Arthritis ,  ? Abdominal ?(+) + obese,   ?Peds ? Hematology ?plavix   ?Anesthesia Other Findings ? ? Reproductive/Obstetrics ? ?  ? ? ? ? ? ? ? ? ? ? ? ? ? ?  ?  ? ? ? ? ? ? ?Anesthesia Physical ?Anesthesia Plan ? ?ASA: 3 ? ?Anesthesia Plan: General  ? ?Post-op Pain Management: Tylenol PO (pre-op)*  ? ?Induction:  ? ?PONV Risk Score and Plan: 3 and Ondansetron, Treatment may vary due to age or medical condition and Dexamethasone ? ?Airway Management Planned: Oral ETT ? ?Additional Equipment: None ? ?Intra-op Plan:  ? ?Post-operative Plan: Extubation in OR ? ?Informed Consent: I have reviewed the patients History and  Physical, chart, labs and discussed the procedure including the risks, benefits and alternatives for the proposed anesthesia with the patient or authorized representative who has indicated his/her understanding and acceptance.  ? ? ? ?Dental advisory given and Consent reviewed with POA ? ?Plan Discussed with: Surgeon and CRNA ? ?Anesthesia Plan Comments: (Pt on Plavix: GETA)  ? ? ? ? ?Anesthesia Quick Evaluation ? ?

## 2022-02-08 NOTE — Progress Notes (Signed)
Patient was set up on CPAP with home settings. 4LPM bled in. Patient is tolerating well. RT will continue to monitor. ?

## 2022-02-08 NOTE — Anesthesia Procedure Notes (Addendum)
Procedure Name: Intubation ?Date/Time: 02/08/2022 2:51 PM ?Performed by: Erick Colace, CRNA ?Pre-anesthesia Checklist: Patient identified, Emergency Drugs available, Suction available and Patient being monitored ?Patient Re-evaluated:Patient Re-evaluated prior to induction ?Oxygen Delivery Method: Circle system utilized ?Preoxygenation: Pre-oxygenation with 100% oxygen ?Induction Type: IV induction ?Ventilation: Mask ventilation without difficulty, Oral airway inserted - appropriate to patient size and Two handed mask ventilation required ?Laryngoscope Size: Mac and 4 ?Grade View: Grade III ?Tube type: Oral ?Tube size: 7.5 mm ?Number of attempts: 1 ?Airway Equipment and Method: Stylet and Oral airway ?Placement Confirmation: ETT inserted through vocal cords under direct vision, positive ETCO2 and breath sounds checked- equal and bilateral ?Secured at: 22 cm ?Tube secured with: Tape ?Dental Injury: Teeth and Oropharynx as per pre-operative assessment  ? ? ? ? ?

## 2022-02-08 NOTE — ED Notes (Signed)
Patient wanted Cpap machine and so resp brought it to him, he them refused because the mask wasn't like his at home.  ?

## 2022-02-08 NOTE — ED Notes (Signed)
Patient wanted

## 2022-02-08 NOTE — ED Notes (Signed)
Transported to PACU for procedure.  ?

## 2022-02-08 NOTE — H&P (Addendum)
?History and Physical  ? ? ?Patient: Jordan [name]:[id number] DOB: [birth year] ?DOA: 02/08/2022 ?DOS: the patient was seen and examined on 02/08/2022 ?PCP: [name], MD  ?Patient coming from: Home via EMS ? ?Chief Complaint:  ?Chief Complaint  ?Patient presents with  ? Hip Pain  ?  Pt had mechanical fall onto left hip and is having hip pain 7/10. History of left hip fracture  ? ?HPI: Jordan [name] is a 81 y.o. male with medical history significant of hypertension, diastolic CHF, CAD, bradycardia  s/p PPM, type 2 diabetes mellitus, Parkinson's disease, gout, narcolepsy, and OSA presents after having a fall at home.  Patient lives at home with his wife and normally ambulates with the use of a walker.  He states that his wife told him never to bend over, but bent over last night to pick up batteries that have fallen out of the remote when he lost his balance landing on his left hip.  He thinks he hit his head, but did not report any loss of consciousness.  After fall he was unable to bear weight on the left leg and reports being in severe pain.  He has had a intermittent cough ever since being hospitalized last month with pancreatitis with proximal sigmoid diverticulitis.  After that hospitalization he was sent to rehab prior to him being discharged home. ? ?Upon admission into the emergency department patient was noted to have stable vital signs.  X-rays of the left hip and pelvis significant for mildly displaced intertrochanteric fracture of the proximal left femur.  Labs significant for WBC 14.9, BUN 24, creatinine 0.93, glucose 175 and all other vital signs maintained.  All other imaging noted chronic changes without any acute fractures or abnormality appreciated.  Patient had been given morphine for pain.  Case was discussed with Dr. Marlou Sa of orthopedics who plans on surgery this afternoon. ? ? ? ?Review of Systems: As mentioned in the history of present illness. All other systems reviewed and are  negative. ?Past Medical History:  ?Diagnosis Date  ? Arthritis   ? R shoulder, bone spur  ? Arthritis   ? "hands" (03/25/2016)  ? Chronic diastolic heart failure ([place])   ? Chronic lower back pain   ? Coronary heart disease   ? Dr Pernell Dupre  ? H/O cardiovascular stress test   ? perhaps last one was 2009  ? Heart murmur   ? 12/29/11 echo: mild MR, no AS, trivial TR  ? History of gout   ? Hyperlipidemia   ? Hypertension   ? saw Dr. Linard Millers last earl;y- 2014, cardiac cath. last done ?2009, blocks seen didn't require any intervention at that point.  ? Narcolepsy   ? [name] 04-11-97; Mean Latency 1.53mn, SOREM 2  ? OSA on CPAP   ? NPSG 12-13-98 AHI 22.7  ? PONV (postoperative nausea and vomiting)   ? Presence of permanent cardiac pacemaker   ? Shortness of breath   ? with exertion   ? Stroke ([facility]   ? [place]   ? ?Past Surgical History:  ?Procedure Laterality Date  ? CARDIAC CATHETERIZATION  2009?  ? EP IMPLANTABLE DEVICE N/A 03/25/2016  ? Procedure: Pacemaker Implant - Dual Chamber;  Surgeon: GEvans Lance MD;  [facility];  Service: Cardiovascular;  Laterality: N/A;  ? FRACTURE SURGERY    ? INSERT / REPLACE / REMOVE PACEMAKER  03/24/2016  ? IR KYPHO LUMBAR INC FX REDUCE BONE BX  UNI/BIL CANNULATION INC/IMAGING  12/29/2021  ? IR RADIOLOGIST EVAL & MGMT  12/15/2021  ? LEFT AND RIGHT HEART CATHETERIZATION WITH CORONARY ANGIOGRAM N/A 06/05/2014  ? Procedure: LEFT AND RIGHT HEART CATHETERIZATION WITH CORONARY ANGIOGRAM;  Surgeon: Sinclair Grooms, MD;  Location: [facility] CATH LAB;  Service: Cardiovascular;  Laterality: N/A;  ? NASAL FRACTURE SURGERY    ? SHOULDER ARTHROSCOPY WITH ROTATOR CUFF REPAIR AND SUBACROMIAL DECOMPRESSION Right 08/16/2013  ? Procedure: SHOULDER ARTHROSCOPY WITH ROTATOR CUFF REPAIR AND SUBACROMIAL DECOMPRESSION;  Surgeon: [name], MD;  Location: [facility];  Service: Orthopedics;  Laterality: Right;  Right shoulder arthroscopy rotator cuff repair, subacromial  decompression.  ? TONSILLECTOMY    ? ?Social History:  reports that he quit smoking about 54 years ago. His smoking use included cigarettes. He has a 15.00 pack-year smoking history. He has never used smokeless tobacco. He reports current alcohol use. He reports that he does not use drugs. ? ?No Known Allergies ? ?Family History  ?Problem Relation Age of Onset  ? Sleep apnea Sister   ? Colon cancer Sister   ? ? ?Prior to Admission medications   ?Medication Sig Start Date End Date Taking? Authorizing Provider  ?acetaminophen (TYLENOL) 325 MG tablet Take 1-2 tablets (325-650 mg total) by mouth every 4 (four) hours as needed for mild pain. 01/18/22   Love, Ivan Anchors, PA-C  ?amantadine (SYMMETREL) 100 MG capsule Take 1 capsule (100 mg total) by mouth daily. 01/19/22   Love, Ivan Anchors, PA-C  ?atorvastatin (LIPITOR) 40 MG tablet Take 0.5 tablets (20 mg total) by mouth daily. 07/30/21   Medina-Vargas, Senaida Lange, NP  ?bifidobacterium infantis (ALIGN) capsule Take 1 capsule by mouth daily.    [provider]  ?carbidopa-levodopa (SINEMET) 25-100 MG tablet Take 1 tablet by mouth 3 (three) times daily. 09/14/21   Frann Rider, NP  ?Carbidopa-Levodopa ER (SINEMET CR) 25-100 MG tablet controlled release Take 1 tablet by mouth at bedtime.    [provider]  ?cholecalciferol (VITAMIN D3) 25 MCG (1000 UNIT) tablet Take 1,000 Units by mouth daily.    [provider]  ?clopidogrel (PLAVIX) 75 MG tablet Take 1 tablet (75 mg total) by mouth daily. 01/18/22 01/18/23  Love, Ivan Anchors, PA-C  ?furosemide (LASIX) 20 MG tablet USE AS NEEDED FOR UP TO 5 DAYS IF SWELLING IN LEGS RECURS. CONTACT CARDIOLOGY IS SWELLING DOES NOT IMPROVE OR GETS WORSE. 01/26/22   Raulkar, Clide Deutscher, MD  ?lidocaine (LIDODERM) 5 % Place 1 patch onto the skin daily as needed. Purchase over the counter. On for 12 hours and off for 12 hours 01/18/22   Love, Ivan Anchors, PA-C  ?magnesium gluconate (MAGONATE) 500 MG tablet Take 0.5 tablets (250 mg total)  by mouth at bedtime. 01/18/22   Bary Leriche, PA-C  ?Melatonin 10 MG TABS Take 10 mg by mouth at bedtime.    [provider]  ?metFORMIN (GLUCOPHAGE) 1000 MG tablet Take 1 tablet (1,000 mg total) by mouth daily with breakfast. 01/18/22   Love, Ivan Anchors, PA-C  ?metoprolol tartrate (LOPRESSOR) 25 MG tablet Take 1 tablet (25 mg total) by mouth 2 (two) times daily. 07/30/21   Medina-Vargas, Senaida Lange, NP  ?Multiple Vitamin (MULTIVITAMIN WITH MINERALS) TABS tablet Take 1 tablet by mouth daily. 02/19/18   Georgette Shell, MD  ?NITROSTAT 0.4 MG SL tablet PLACE 1 TABLET (0.4 MG TOTAL) UNDER THE TONGUE EVERY 5 (FIVE) MINUTES AS NEEDED FOR CHEST PAIN. ?Patient taking differently: Place 0.4 mg under the  tongue every 5 (five) minutes as needed for chest pain. 03/31/16   Belva Crome, MD  ?Omega 3 1200 MG CAPS Take 1,200 mg by mouth daily.    [provider]  ?polyethylene glycol (MIRALAX / GLYCOLAX) 17 g packet Take 17 g by mouth daily. 01/09/22   Antonieta Pert, MD  ?polyvinyl alcohol (LIQUIFILM TEARS) 1.4 % ophthalmic solution Place 1 drop into both eyes as needed for dry eyes. 01/18/22   Bary Leriche, PA-C  ?PRESCRIPTION MEDICATION See admin instructions. CPAP- At bedtime    [provider]  ?QUEtiapine (SEROQUEL) 25 MG tablet Take 1/2 tablets (12.5 mg total) by mouth at bedtime. 01/18/22   Love, Ivan Anchors, PA-C  ?senna-docusate (SENOKOT-S) 8.6-50 MG tablet Take 2 tablets by mouth daily with supper. 01/18/22   Bary Leriche, PA-C  ?Vitamin D, Ergocalciferol, (DRISDOL) 1.25 MG (50000 UNIT) CAPS capsule Take 1 capsule (50,000 Units total) by mouth every 7 (seven) days. 01/18/22   Bary Leriche, PA-C  ? ? ?Physical Exam: ?Vitals:  ? 02/08/22 0600 02/08/22 0615 02/08/22 0630 02/08/22 0645  ?BP: (!) 148/66 124/64 136/72 130/63  ?Pulse: 71 65 66 66  ?Resp:    20  ?Temp:      ?TempSrc:      ?SpO2: 94% 95% 92% 93%  ?Weight:      ?Height:      ? ?Constitutional: NAD, calm, comfortable ?Eyes: PERRL, lids and  conjunctivae normal ?ENMT: Mucous membranes are moist. Posterior pharynx clear of any exudate or lesions.Normal dentition.  ?Neck: normal, supple, no masses, no thyromegaly ?Respiratory: clear to auscultation bila

## 2022-02-08 NOTE — ED Provider Notes (Signed)
?[facility] ?Provider Note ? ? ?CSN: [id number] ?Arrival date & time: 02/08/22  0036 ? ?  ? ?History ? ?Chief Complaint  ?Patient presents with  ? Hip Pain  ?  Pt had mechanical fall onto left hip and is having hip pain 7/10. History of left hip fracture  ? ? ?Jordan [name] is a 81 y.o. male. ? ?The history is provided by the patient.  ?Hip Pain ?He has history of hypertension, diabetes, hyperlipidemia, stroke, Parkinson's disease, coronary artery disease, diastolic heart failure and comes in by ambulance after having a fall at home.  He states that the batteries in his television remote fell on the floor and he reached down to get them and got dizzy and fell.  He is complaining of pain in his left hip.  He also did hit his head and right shoulder.  He is not complaining of any pain in his right shoulder.  He denies loss of consciousness.  Although he does not take any systemic anticoagulants, he does take clopidogrel.  He does relate a prior fall with fracture of the acetabulum of the left hip treated nonoperatively. ?  ?Home Medications ?Prior to Admission medications   ?Medication Sig Start Date End Date Taking? Authorizing Provider  ?acetaminophen (TYLENOL) 325 MG tablet Take 1-2 tablets (325-650 mg total) by mouth every 4 (four) hours as needed for mild pain. 01/18/22   Love, Ivan Anchors, PA-C  ?amantadine (SYMMETREL) 100 MG capsule Take 1 capsule (100 mg total) by mouth daily. 01/19/22   Love, Ivan Anchors, PA-C  ?atorvastatin (LIPITOR) 40 MG tablet Take 0.5 tablets (20 mg total) by mouth daily. 07/30/21   Medina-Vargas, Senaida Lange, NP  ?bifidobacterium infantis (ALIGN) capsule Take 1 capsule by mouth daily.    [provider]  ?carbidopa-levodopa (SINEMET) 25-100 MG tablet Take 1 tablet by mouth 3 (three) times daily. 09/14/21   Frann Rider, NP  ?Carbidopa-Levodopa ER (SINEMET CR) 25-100 MG tablet controlled release Take 1 tablet by mouth at bedtime.    [provider]  ?cholecalciferol (VITAMIN D3) 25 MCG (1000 UNIT) tablet Take 1,000 Units by mouth daily.    [provider]  ?clopidogrel (PLAVIX) 75 MG tablet Take 1 tablet (75 mg total) by mouth daily. 01/18/22 01/18/23  Love, Ivan Anchors, PA-C  ?furosemide (LASIX) 20 MG tablet USE AS NEEDED FOR UP TO 5 DAYS IF SWELLING IN LEGS RECURS. CONTACT CARDIOLOGY IS SWELLING DOES NOT IMPROVE OR GETS WORSE. 01/26/22   Raulkar, Clide Deutscher, MD  ?lidocaine (LIDODERM) 5 % Place 1 patch onto the skin daily as needed. Purchase over the counter. On for 12 hours and off for 12 hours 01/18/22   Love, Ivan Anchors, PA-C  ?magnesium gluconate (MAGONATE) 500 MG tablet Take 0.5 tablets (250 mg total) by mouth at bedtime. 01/18/22   Bary Leriche, PA-C  ?Melatonin 10 MG TABS Take 10 mg by mouth at bedtime.    [provider]  ?metFORMIN (GLUCOPHAGE) 1000 MG tablet Take 1 tablet (1,000 mg total) by mouth daily with breakfast. 01/18/22   Love, Ivan Anchors, PA-C  ?metoprolol tartrate (LOPRESSOR) 25 MG tablet Take 1 tablet (25 mg total) by mouth 2 (two) times daily. 07/30/21   Medina-Vargas, Senaida Lange, NP  ?Multiple Vitamin (MULTIVITAMIN WITH MINERALS) TABS tablet Take 1 tablet by mouth daily. 02/19/18   Georgette Shell, MD  ?NITROSTAT 0.4 MG SL tablet PLACE 1 TABLET (0.4 MG TOTAL) UNDER THE TONGUE EVERY 5 (FIVE) MINUTES  AS NEEDED FOR CHEST PAIN. ?Patient taking differently: Place 0.4 mg under the tongue every 5 (five) minutes as needed for chest pain. 03/31/16   Belva Crome, MD  ?Omega 3 1200 MG CAPS Take 1,200 mg by mouth daily.    [provider]  ?polyethylene glycol (MIRALAX / GLYCOLAX) 17 g packet Take 17 g by mouth daily. 01/09/22   Antonieta Pert, MD  ?polyvinyl alcohol (LIQUIFILM TEARS) 1.4 % ophthalmic solution Place 1 drop into both eyes as needed for dry eyes. 01/18/22   Bary Leriche, PA-C  ?PRESCRIPTION MEDICATION See admin instructions. CPAP- At bedtime    [provider]  ?QUEtiapine (SEROQUEL) 25 MG tablet  Take 1/2 tablets (12.5 mg total) by mouth at bedtime. 01/18/22   Love, Ivan Anchors, PA-C  ?senna-docusate (SENOKOT-S) 8.6-50 MG tablet Take 2 tablets by mouth daily with supper. 01/18/22   Bary Leriche, PA-C  ?Vitamin D, Ergocalciferol, (DRISDOL) 1.25 MG (50000 UNIT) CAPS capsule Take 1 capsule (50,000 Units total) by mouth every 7 (seven) days. 01/18/22   Bary Leriche, PA-C  ?   ? ?Allergies    ?Patient has no known allergies.   ? ?Review of Systems   ?Review of Systems  ?All other systems reviewed and are negative. ? ?Physical Exam ?Updated Vital Signs ?BP 122/69 (BP Location: Right Arm)   Pulse (!) 59   Temp 98.1 ?F (36.7 ?C) (Oral)   Resp 16   Ht '5\' 9"'$  (1.753 m)   Wt 96.6 kg   SpO2 93%   BMI 31.45 kg/m?  ?Physical Exam ?Vitals and nursing note reviewed.  ?81 year old male, resting comfortably and in no acute distress. Vital signs are normal. Oxygen saturation is 93%, which is normal. ?Head is normocephalic and atraumatic. PERRLA, EOMI. Oropharynx is clear. ?Neck is nontender without adenopathy or JVD. ?Back is nontender and there is no CVA tenderness. ?Lungs are clear without rales, wheezes, or rhonchi. ?Chest is nontender. ?Heart has regular rate and rhythm without murmur. ?Abdomen is soft, flat, nontender. ?Extremities: Left leg does not have any shortening or rotation.  There is tenderness palpation over the lateral aspect of the left hip and there is pain with passive range of motion of the left hip.  There is no cyanosis or edema, remainder of extremity exam is normal.  There is full range and full motion all other joints without pain. ?Skin is warm and dry without rash. ?Neurologic: Mental status is normal, cranial nerves are intact, moves all extremities equally, except for left leg. ? ?ED Results / Procedures / Treatments   ?Labs ?(all labs ordered are listed, but only abnormal results are displayed) ?Labs Reviewed  ?BASIC METABOLIC PANEL - Abnormal; Notable for the following components:  ?     Result Value  ? Glucose, Bld 175 (*)   ? BUN 24 (*)   ? All other components within normal limits  ?CBC WITH DIFFERENTIAL/PLATELET - Abnormal; Notable for the following components:  ? WBC 14.9 (*)   ? Neutro Abs 10.9 (*)   ? Abs Immature Granulocytes 0.09 (*)   ? All other components within normal limits  ?PROTIME-INR  ?TYPE AND SCREEN  ? ? ?EKG ?None ? ?Radiology ?CT Head Wo Contrast ? ?Result Date: 02/08/2022 ?CLINICAL DATA:  Recent fall with headaches and neck pain, initial encounter EXAM: CT HEAD WITHOUT CONTRAST CT CERVICAL SPINE WITHOUT CONTRAST TECHNIQUE: Multidetector CT imaging of the head and cervical spine was performed following the standard protocol without intravenous  contrast. Multiplanar CT image reconstructions of the cervical spine were also generated. RADIATION DOSE REDUCTION: This exam was performed according to the departmental dose-optimization program which includes automated exposure control, adjustment of the mA and/or kV according to patient size and/or use of iterative reconstruction technique. COMPARISON:  None. FINDINGS: CT HEAD FINDINGS Brain: No evidence of acute infarction, hemorrhage, hydrocephalus, extra-axial collection or mass lesion/mass effect. Atrophic and chronic white matter ischemic changes are noted. Old right occipital infarct is noted. Similar findings are noted in left frontal lobe. Vascular: No hyperdense vessel or unexpected calcification. Skull: Normal. Negative for fracture or focal lesion. Sinuses/Orbits: Orbits and their contents are within normal limits. Mucosal thickening in the left maxillary antrum is noted. Other: None CT CERVICAL SPINE FINDINGS Alignment: Loss of normal cervical lordosis is noted Skull base and vertebrae: 7 cervical segments are well visualized. Vertebral body height is well maintained. Multilevel osteophytic changes and facet hypertrophic changes are seen. No acute fracture or acute facet abnormality is noted. Soft tissues and spinal canal:  Surrounding soft tissue structures show no vascular calcifications. No other focal abnormality is seen. Upper chest: Visualized lung apices are within normal limits. Other: None IMPRESSION: CT of the head: Chronic atro

## 2022-02-08 NOTE — Assessment & Plan Note (Signed)
Patient well controlled.  Last hemoglobin A1c 6.1 on 3/2.  On admission glucose 175.  Home medication regimen includes metformin 1000 mg daily. ?-Hypoglycemic protocols ?-Hold metformin ?-CBGs before every meal with sensitive SSI ?-Adjust insulin regimen as needed ? ?

## 2022-02-08 NOTE — Assessment & Plan Note (Signed)
Home regimen includes atorvastatin 20 mg daily, and Lovaza 1000 mg daily. ?-Continue atorvastatin and Lovaza ?

## 2022-02-08 NOTE — Progress Notes (Signed)
Patient can state he has less pain in l thigh than prior to nerve block. VVS, returned to ED 012C, report to [facility] by Juliette Alcide RN ?

## 2022-02-08 NOTE — Anesthesia Procedure Notes (Signed)
Anesthesia Regional Block: Femoral nerve block  ? ?Pre-Anesthetic Checklist: , timeout performed,  Correct Patient, Correct Site, Correct Laterality,  Correct Procedure, Correct Position, site marked,  Risks and benefits discussed,  Surgical consent,  Pre-op evaluation,  At surgeon's request and post-op pain management ? ?Laterality: Left ? ?Prep: chloraprep     ?  ?Needles:  ?Injection technique: Single-shot ? ?Needle Type: Echogenic Stimulator Needle   ? ? ?Needle Length: 10cm  ?Needle Gauge: 20  ? ? ? ?Additional Needles: ? ? ?Procedures:,,,, ultrasound used (permanent image in chart),,    ?Narrative:  ?Start time: 02/08/2022 9:27 AM ?End time: 02/08/2022 9:39 AM ?Injection made incrementally with aspirations every 5 mL. ? ?Performed by: Personally  ?Anesthesiologist: Lidia Collum, MD ? ?Additional Notes: ?Standard monitors applied. Skin prepped. Good needle visualization with ultrasound. Injection made in 5cc increments with no resistance to injection. Patient tolerated the procedure well. ? ? ? ? ? ?

## 2022-02-08 NOTE — Assessment & Plan Note (Signed)
Continue CPAP nightly. °

## 2022-02-08 NOTE — ED Notes (Signed)
Returned from PACU.

## 2022-02-08 NOTE — Assessment & Plan Note (Addendum)
Patient with a diastolic CHF last EF noted to be 60-65% with indeterminate diastolic dysfunction on 4/0/3474. ?-Strict I&O's and daily weight ?-Continue furosemide ? ?

## 2022-02-09 ENCOUNTER — Other Ambulatory Visit: Payer: Self-pay | Admitting: Physical Medicine and Rehabilitation

## 2022-02-09 ENCOUNTER — Encounter (HOSPITAL_COMMUNITY): Payer: Self-pay | Admitting: Orthopedic Surgery

## 2022-02-09 DIAGNOSIS — E7800 Pure hypercholesterolemia, unspecified: Secondary | ICD-10-CM

## 2022-02-09 DIAGNOSIS — S72142A Displaced intertrochanteric fracture of left femur, initial encounter for closed fracture: Secondary | ICD-10-CM | POA: Diagnosis not present

## 2022-02-09 DIAGNOSIS — I2510 Atherosclerotic heart disease of native coronary artery without angina pectoris: Secondary | ICD-10-CM

## 2022-02-09 DIAGNOSIS — E1165 Type 2 diabetes mellitus with hyperglycemia: Secondary | ICD-10-CM | POA: Diagnosis not present

## 2022-02-09 DIAGNOSIS — I5032 Chronic diastolic (congestive) heart failure: Secondary | ICD-10-CM | POA: Diagnosis not present

## 2022-02-09 LAB — CBC
HCT: 37.3 % — ABNORMAL LOW (ref 39.0–52.0)
Hemoglobin: 12.8 g/dL — ABNORMAL LOW (ref 13.0–17.0)
MCH: 30.3 pg (ref 26.0–34.0)
MCHC: 34.3 g/dL (ref 30.0–36.0)
MCV: 88.2 fL (ref 80.0–100.0)
Platelets: 206 10*3/uL (ref 150–400)
RBC: 4.23 MIL/uL (ref 4.22–5.81)
RDW: 13.6 % (ref 11.5–15.5)
WBC: 12.1 10*3/uL — ABNORMAL HIGH (ref 4.0–10.5)
nRBC: 0 % (ref 0.0–0.2)

## 2022-02-09 LAB — BASIC METABOLIC PANEL
Anion gap: 10 (ref 5–15)
BUN: 17 mg/dL (ref 8–23)
CO2: 22 mmol/L (ref 22–32)
Calcium: 9 mg/dL (ref 8.9–10.3)
Chloride: 102 mmol/L (ref 98–111)
Creatinine, Ser: 0.9 mg/dL (ref 0.61–1.24)
GFR, Estimated: >60 mL/min (ref >60)
Glucose, Bld: 204 mg/dL — ABNORMAL HIGH (ref 70–99)
Potassium: 4.6 mmol/L (ref 3.5–5.1)
Sodium: 134 mmol/L — ABNORMAL LOW (ref 135–145)

## 2022-02-09 LAB — GLUCOSE, CAPILLARY
Glucose-Capillary: 239 mg/dL — ABNORMAL HIGH (ref 70–99)
Glucose-Capillary: 288 mg/dL — ABNORMAL HIGH (ref 70–99)
Glucose-Capillary: 253 mg/dL — ABNORMAL HIGH (ref 70–99)
Glucose-Capillary: 233 mg/dL — ABNORMAL HIGH (ref 70–99)

## 2022-02-09 MED ORDER — ASPIRIN 81 MG PO CHEW
81.0000 mg | CHEWABLE_TABLET | Freq: Two times a day (BID) | ORAL | Status: DC
  Administered 2022-02-09 – 2022-02-18 (×18): 81 mg via ORAL
  Filled 2022-02-09 (×19): qty 1

## 2022-02-09 MED ORDER — INSULIN ASPART 100 UNIT/ML IJ SOLN
0.0000 [IU] | INTRAMUSCULAR | Status: DC
  Administered 2022-02-09: 3 [IU] via SUBCUTANEOUS
  Administered 2022-02-10: 2 [IU] via SUBCUTANEOUS
  Administered 2022-02-10: 5 [IU] via SUBCUTANEOUS
  Administered 2022-02-10: 8 [IU] via SUBCUTANEOUS
  Administered 2022-02-10 (×3): 3 [IU] via SUBCUTANEOUS
  Administered 2022-02-10: 8 [IU] via SUBCUTANEOUS
  Administered 2022-02-11: 3 [IU] via SUBCUTANEOUS
  Administered 2022-02-11: 5 [IU] via SUBCUTANEOUS
  Administered 2022-02-11 – 2022-02-12 (×2): 3 [IU] via SUBCUTANEOUS
  Administered 2022-02-12 (×2): 5 [IU] via SUBCUTANEOUS
  Administered 2022-02-12: 3 [IU] via SUBCUTANEOUS
  Administered 2022-02-12 (×2): 5 [IU] via SUBCUTANEOUS
  Administered 2022-02-13: 2 [IU] via SUBCUTANEOUS
  Administered 2022-02-13 (×2): 3 [IU] via SUBCUTANEOUS
  Administered 2022-02-14: 2 [IU] via SUBCUTANEOUS
  Administered 2022-02-14 (×4): 3 [IU] via SUBCUTANEOUS
  Administered 2022-02-15 (×3): 2 [IU] via SUBCUTANEOUS
  Administered 2022-02-15: 3 [IU] via SUBCUTANEOUS
  Administered 2022-02-15: 5 [IU] via SUBCUTANEOUS
  Administered 2022-02-16: 3 [IU] via SUBCUTANEOUS
  Administered 2022-02-16: 2 [IU] via SUBCUTANEOUS
  Administered 2022-02-16 – 2022-02-17 (×7): 3 [IU] via SUBCUTANEOUS
  Administered 2022-02-18 (×2): 5 [IU] via SUBCUTANEOUS

## 2022-02-09 MED ORDER — GLUCERNA SHAKE PO LIQD
237.0000 mL | Freq: Three times a day (TID) | ORAL | Status: DC
  Administered 2022-02-09 – 2022-02-18 (×25): 237 mL via ORAL

## 2022-02-09 NOTE — Progress Notes (Signed)
Inpatient Rehab Admissions Coordinator:  ? ?Per therapy recommendations,  was screened for CIR candidacy by Clemens Catholic, MS, CCC-SLP ?[name] At this time, Pt. does not appear to demonstrate medical necessity to justify in hospital rehabilitation/CIR. will not pursue a rehab consult for this Pt.   Recommend other rehab venues to be pursued.  Please contact me with any questions. ? ?Clemens Catholic, MS, CCC-SLP ?Rehab Admissions Coordinator  ?[phone] (celll) ?[phone] (office) ? ? ?

## 2022-02-09 NOTE — Progress Notes (Signed)
?PROGRESS NOTE ? ? ? ?Jordan [name]  UUV:[id number] DOB: 03-16-1941 DOA: 02/08/2022 ?PCP: [name], MD  ? ? ? ?Brief Narrative:  ?81 y.o. WM PMHx HTN, Diastolic CHF, CAD, bradycardia  s/p PPM, type 2 diabetes mellitus, Parkinson's disease, gout, Narcolepsy, and OSA  ? ?Presents after having a fall at home.  Patient lives at home with his wife and normally ambulates with the use of a walker.  He states that his wife told him never to bend over, but bent over last night to pick up batteries that have fallen out of the remote when he lost his balance landing on his left hip.  He thinks he hit his head, but did not report any loss of consciousness.  After fall he was unable to bear weight on the left leg and reports being in severe pain.  He has had a intermittent cough ever since being hospitalized last month with pancreatitis with proximal sigmoid diverticulitis.  After that hospitalization he was sent to rehab prior to him being discharged home. ?  ?Upon admission into the emergency department patient was noted to have stable vital signs.  X-rays of the left hip and pelvis significant for mildly displaced intertrochanteric fracture of the proximal left femur.  Labs significant for WBC 14.9, BUN 24, creatinine 0.93, glucose 175 and all other vital signs maintained.  All other imaging noted chronic changes without any acute fractures or abnormality appreciated.  Patient had been given morphine for pain.  Case was discussed with Dr. Marlou Sa of orthopedics who plans on surgery this afternoon. ? ? ?Subjective: ?3/11 A/O x4.  Pain controled. ? ? ?Assessment & Plan: ?Covid vaccination; ?  ?Principal Problem: ?  Closed left hip fracture ([place]) secondary to fall at home ?Active Problems: ?  Leukocytosis ?  Chronic diastolic heart failure ([place]) ?  History of CVA (cerebrovascular accident) ?  Essential hypertension ?  HLD (hyperlipidemia) ?  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  Coronary atherosclerosis ?  Controlled type 2 diabetes  mellitus with hyperglycemia, without long-term current use of insulin ([place]) ?  Fall at home, initial encounter ?  Parkinson disease ([place]) ?  Paroxysmal atrial fibrillation ([facility]) ? ?Closed left hip fracture ([place]) secondary to fall at home ?Acute.  Patient presents after having fallen at home found to have acute mildly displaced intertrochanteric left femur fracture on imaging.  Case was discussed with Dr. Marlou Sa who is undergoing surgery this afternoon. ?-Admit to a surgery telemetry bed ?-Hip fracture order set utilized ?-N.p.o. prior to surgery with orders placed to advance diet on procedure ?-Nonweightbearing on affected leg ?-Hydrocodone/morphine as needed for moderate to severe pain respectively ?-Bowel regimen ?-Appreciate orthopedic consultative services, will follow-up for any further recommendations ?  ?Leukocytosis ?Acute.  WBC elevated 14.9.    Chest x-ray did not note any acute abnormalities. Suspect secondary to acute fracture. ?-Check urinalysis ?-Recheck CBC tomorrow morning ?  ?Chronic diastolic heart failure ([place]) ?Patient with a diastolic CHF last EF noted to be 60-65% with indeterminate diastolic dysfunction on 02/05/4258.  No signs of fluid overload noted on physical exam. ?-Strict INO ?- Daily weight ?-Continue furosemide 20 mg daily ?  ?Essential hypertension ?On admission blood pressure appears relatively controlled.  Home medication regimen includes metoprolol 25 mg twice daily and furosemide 20 mg daily. ?-Continue home regimen as tolerated ?  ?Paroxysmal atrial fibrillation ([facility]) ?Patient was noted during last hospitalization and being in atrial fibrillation for 4 hours, but thought to be in the setting of infection.  No other events were reported.  He was not started on anticoagulation at that time. ?-Currently NSR ?  ?Parkinson disease ([place]) ?During prior hospitalization patient had noted to have hallucinations. ?-Delirium precautions ?-Continue Sinemet, amantadine, and Seroquel   ?  ?DM type  II controlled with hyperglycemia, without long-term current use of insulin ([place]) ?Patient well controlled.  Last hemoglobin A1c 6.1 on 3/2.  On admission glucose 175.  Home medication regimen includes metformin 1000 mg daily. ?-4/11 moderate SSI ?-Hold metformin ? ?History of CVA (cerebrovascular accident) ?-Continue Plavix and statin ?  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  ?HLD (hyperlipidemia) ?Home regimen includes atorvastatin 20 mg daily, and Lovaza 1000 mg daily. ?-Continue atorvastatin and Lovaza ? ?Obese (BMI 33.1 kg/m?) ?  ? ? ?  ?Mobility Assessment (last 72 hours)   ? ? Mobility Assessment   ? ? [place] Name 02/09/22 1231 02/09/22 0855 02/08/22 2050 02/08/22 1935  ?  ? Does patient have an order for bedrest or is patient medically unstable -- No - Continue assessment No - Continue assessment No - Continue assessment   ? What is the highest level of mobility based on the progressive mobility assessment? Level 3 (Stands with assist) - Balance while standing  and cannot march in place  with stedy Level 3 (Stands with assist) - Balance while standing  and cannot march in place Level 1 (Bedfast) - Unable to balance while sitting on edge of bed Level 1 (Bedfast) - Unable to balance while sitting on edge of bed   ? Is the above level different from baseline mobility prior to current illness? -- -- Yes - Recommend PT order Yes - Recommend PT order   ? ?  ?  ? ?  ? ? ?DVT prophylaxis:  ?Code Status: Full ?Family Communication:  ?Status is: Inpatient ? ? ? ?Dispo: The patient is from: Home ?             Anticipated d/c is to: Home ?             Anticipated d/c date is: 2 days ?             Patient currently is not medically stable to d/c. ? ? ? ? ? ?Consultants:  ?Orthopedic surgery ? ?Procedures/Significant Events:  ?4/11 LEFT Hip INTRAMEDULLARY (IM) NAIL INTERTROCHANTRIC ? ? ?I have personally reviewed and interpreted all radiology studies and my findings are as above. ? ?VENTILATOR  SETTINGS: ? ? ? ?Cultures ? ? ?Antimicrobials: ?Anti-infectives (From admission, onward)  ? ? Start     Dose/Rate Route Frequency Ordered Stop  ? 02/09/22 0600  ceFAZolin (ANCEF) IVPB 2g/100 mL premix       ? 2 g ?200 mL/hr over 30 Minutes Intravenous On call to O.R. 02/08/22 1334 02/08/22 1523  ? 02/08/22 2200  ceFAZolin (ANCEF) IVPB 2g/100 mL premix       ? 2 g ?200 mL/hr over 30 Minutes Intravenous Every 8 hours 02/08/22 1854 02/09/22 0609  ? ?  ?  ? ? ?Devices ?  ? ?LINES / TUBES:  ? ? ? ? ?Continuous Infusions: ? methocarbamol (ROBAXIN) IV    ? ? ? ?Objective: ?Vitals:  ? 02/09/22 0500 02/09/22 0802 02/09/22 1337 02/09/22 1537  ?BP:  (!) 95/56  (!) 142/73  ?Pulse:   76 77  ?Resp:  20  17  ?Temp:  97.7 ?F (36.5 ?C) 97.6 ?F (36.4 ?C) 97.7 ?F (36.5 ?C)  ?TempSrc:  Oral Oral Oral  ?  SpO2:  90% 90% 96%  ?Weight: 101.7 kg     ?Height:      ? ? ?Intake/Output Summary (Last 24 hours) at 02/09/2022 1852 ?Last data filed at 02/09/2022 0900 ?Gross per 24 hour  ?Intake 1278.17 ml  ?Output --  ?Net 1278.17 ml  ? ?Filed Weights  ? 02/08/22 0039 02/09/22 0500  ?Weight: 96.6 kg 101.7 kg  ? ? ?Examination: ? ?General: No acute respiratory distress ?Eyes: negative scleral hemorrhage, negative anisocoria, negative icterus ?ENT: Negative Runny nose, negative gingival bleeding, ?Neck:  Negative scars, masses, torticollis, lymphadenopathy, JVD ?Lungs: Clear to auscultation bilaterally without wheezes or crackles ?Cardiovascular: Regular rate and rhythm without murmur gallop or rub normal S1 and S2 ?Abdomen: negative abdominal pain, nondistended, positive soft, bowel sounds, no rebound, no ascites, no appreciable mass ?Extremities: LLE appropriately tender ?Skin: Negative rashes, lesions, ulcers ?Psychiatric:  Negative depression, negative anxiety, negative fatigue, negative mania  ?Central nervous system:  Cranial nerves II through XII intact, tongue/uvula midline, all extremities muscle strength 5/5, sensation intact throughout,  negative dysarthria, negative expressive aphasia, negative receptive aphasia. ? ?.  ? ? ? ?Data Reviewed: Care during the described time interval was provided by me .  I have reviewed this patient's available data, including m

## 2022-02-09 NOTE — Progress Notes (Signed)
?  Subjective: ?Patient is an 81 year old male who presents s/p left hip intramedullary nail placement.  Patient is POD 1.  He is doing well overall.  Denies any chest pain, shortness of breath, calf pain, abdominal pain.  Does note some hip pain but states this is better compared with prior to surgery.  Has not been able to ambulate yet.  He is currently 50% partial weightbearing.  Him and his family are very against going to skilled nursing facility and would prefer inpatient rehab.  ? ?Objective: ?Vital signs in last 24 hours: ?Temp:  [97.6 ?F (36.4 ?C)-98.2 ?F (36.8 ?C)] 98.2 ?F (36.8 ?C) (04/11 1948) ?Pulse Rate:  [61-77] 76 (04/11 1948) ?Resp:  [15-20] 18 (04/11 1948) ?BP: (95-142)/(53-73) 124/53 (04/11 1948) ?SpO2:  [90 %-96 %] 91 % (04/11 1948) ?Weight:  [101.7 kg] 101.7 kg (04/11 0500) ? ?Intake/Output from previous day: ?04/10 0701 - 04/11 0700 ?In: 1838.2 [P.O.:360; I.V.:1378.2; IV Piggyback:100] ?Out: 100 [Blood:100] ?Intake/Output this shift: ?No intake/output data recorded. ? ?Exam: ? ?2+ DP pulse of the left lower extremity.  Intact ankle dorsiflexion and plantarflexion.  No calf tenderness.  Dressings are clean dry and intact.  Increased pain with logroll of the lower extremity. ? ?Labs: ?Recent Labs  ?  02/08/22 ?0408 02/09/22 ?0160  ?HGB 13.6 12.8*  ? ?Recent Labs  ?  02/08/22 ?0408 02/09/22 ?0135  ?WBC 14.9* 12.1*  ?RBC 4.54 4.23  ?HCT 41.4 37.3*  ?PLT 242 206  ? ?Recent Labs  ?  02/08/22 ?0408 02/09/22 ?0135  ?NA 138 134*  ?K 4.4 4.6  ?CL 103 102  ?CO2 27 22  ?BUN 24* 17  ?CREATININE 0.93 0.90  ?GLUCOSE 175* 204*  ?CALCIUM 9.5 9.0  ? ?Recent Labs  ?  02/08/22 ?0408  ?INR 1.1  ? ? ?Assessment/Plan: ?Plan is continue with physical therapy.  Likely discharge to skilled nursing facility in the coming days.  Family prefers inpatient rehab but he was recently screened for this earlier today and did not qualify.  Aspirin/Plavix for DVT/VTE prophylaxis along with SCDs and TED hose.  Okay for 50%  weightbearing with walker.  Follow-up with Dr. Marlou Sa in 10 to 14 days for clinical recheck with new radiographs at that time. ? ? ?Gerrianne Scale Terence Googe ?02/09/2022, 9:16 PM  ? ? ? ? ?

## 2022-02-09 NOTE — Progress Notes (Signed)
Initial Nutrition Assessment ? ?DOCUMENTATION CODES:  ?Obesity unspecified ? ?INTERVENTION:  ?-Glucerna Shake po TID, each supplement provides 220 kcal and 10 grams of protein ?-MVI with minerals daily ?-recommend consult to diabetes coordinator  ? ?NUTRITION DIAGNOSIS:  ?Increased nutrient needs related to post-op healing as evidenced by estimated needs. ? ?GOAL:  ?Patient will meet greater than or equal to 90% of their needs ? ?MONITOR:  ?PO intake, Supplement acceptance, Labs, Weight trends ? ?REASON FOR ASSESSMENT:  ?Consult ?Hip fracture protocol ? ?ASSESSMENT:  ?Pt with PMH significant for HTN, CHF, CAD, bradycardia s/p PPM, type 2 DM, Parkinson's disease, gout, narcolepsy, and OSA admitted with closed L hip fx 2/2 mechanical fall. ? ?Pt underwent IMN yesterday. ?Pt unavailable at time of RD visit. Unable to obtain diet/weight history and nutrition-focused physical exam at this time. Will attempt at follow-up.  ? ?Weight history reviewed. No significant weight changes noted.  ? ?PO Intake: 50% x 2 recorded meals  ? ?UOP: 5x unmeasured occurrences x24 hours ?I/O: +1978 ml since admit ? ?Medications: ? acidophilus  1 capsule Oral Daily  ? cholecalciferol  1,000 Units Oral Daily  ? docusate sodium  100 mg Oral BID  ? furosemide  20 mg Oral Daily  ? insulin aspart  0-9 Units Subcutaneous TID WC  ? magnesium gluconate  250 mg Oral QHS  ? multivitamin with minerals  1 tablet Oral Daily  ? omega-3 acid ethyl esters  1,000 mg Oral Daily  ? polyethylene glycol  17 g Oral Daily  ? senna-docusate  2 tablet Oral Q supper  ? Vitamin D (Ergocalciferol)  50,000 Units Oral Q7 days  ? ?Labs: ?Recent Labs  ?Lab 02/08/22 ?0408 02/09/22 ?2641  ?NA 138 134*  ?K 4.4 4.6  ?CL 103 102  ?CO2 27 22  ?BUN 24* 17  ?CREATININE 0.93 0.90  ?CALCIUM 9.5 9.0  ?GLUCOSE 175* 204*  ?CBGs: 139-288 x24 hours ?A1c (12/31/21): 6.1 ? ?Diet Order:   ?Diet Order   ? ?       ?  Diet Carb Modified Fluid consistency: Thin; Room service appropriate? Yes   Diet effective now       ?  ? ?  ?  ? ?  ? ?EDUCATION NEEDS:  ?No education needs have been identified at this time ? ?Skin:  Skin Assessment: Skin Integrity Issues: ?Skin Integrity Issues:: Incisions ?Incisions: L hip ? ?Last BM:  PTA ? ?Height:  ?Ht Readings from Last 1 Encounters:  ?02/08/22 '5\' 9"'$  (1.753 m)  ? ?Weight:  ?Wt Readings from Last 1 Encounters:  ?02/09/22 101.7 kg  ? ?Ideal Body Weight:  72.7 kg ? ?BMI:  Body mass index is 33.11 kg/m?. ? ?Estimated Nutritional Needs:  ?Kcal:  2100-2300 ?Protein:  105-115 grams ?Fluid:  >2L ? ? ? ?Theone Stanley., MS, RD, LDN (she/her/hers) ?RD pager number and weekend/on-call pager number located in [facility]. ?

## 2022-02-09 NOTE — Op Note (Signed)
NAME: Jordan [name], Jordan [name]. ?MEDICAL RECORD NO: [id number] ?ACCOUNT NO: [id number] ?DATE OF BIRTH: 05/12/1941 ?FACILITY: [facility] ?LOCATION: [facility] ?PHYSICIAN: Yetta Barre. Marlou Sa, MD ? ?Operative Report  ? ?DATE OF PROCEDURE: 02/09/2022 ? ? ?PREOPERATIVE DIAGNOSIS:  Left hip intertrochanteric fracture. ? ?POSTOPERATIVE DIAGNOSIS:  Left hip intertrochanteric fracture. ? ?PROCEDURE:  Intertrochanteric fracture open reduction and internal fixation with [employer] IMHS implant. ? ?SURGEON:  Yetta Barre. Marlou Sa, MD ? ?ASSISTANT:  Annie Main. ? ?INDICATIONS:  The patient is an ambulatory 81 year old patient with left hip pain after [name] fall.  Presents for operative management of minimally displaced intertrochanteric fracture. ? ?DESCRIPTION OF PROCEDURE:  The patient was brought to the operating room where spinal anesthetic was induced.  Preoperative antibiotics administered.  Timeout was called.  The patient was placed on the fracture bed with the right leg in lithotomy  ?position with peroneal nerve well padded.  Left hip region was pre-scrubbed with alcohol and Betadine, allowed to air dry, prepped with DuraPrep solution and draped in [name] sterile manner with the wall drape.  Timeout was called.  Incision made 4  ?fingerbreadths proximal to the tip of the trochanter.  Guide pin was placed in the tip of the trochanter under fluoroscopic guidance in the AP and lateral planes.  Proximal reaming was performed.  In accordance with preoperative templating, [name] 10 x 38  ?nail was placed.  Through [name] separate incision, [name] separate compression screw and lag screw was placed, we did achieve about 5 mm of compression across the fracture site.  [name] tip apex distance about 5 mm.  Bone quality, very good.  [name] thorough irrigation was ? performed in both incisions.  Fluoroscopy demonstrated good placement of hardware in the inferior half of the femoral neck.  Good reduction of the fracture.  Thorough irrigation performed and the incisions were anesthetized  with combination of Marcaine, ? morphine, clonidine.  Deep fascia was closed using 0 Vicryl suture followed by interrupted inverted 2-0 Vicryl suture and 3-0 nylon with Aquacel placed.  The patient tolerated the procedure well without immediate complication.  Luke's assistance was  ?required at all times during the case for retraction, opening, closing, drilling.  His assistance was [name] medical necessity.  ? ? ? ?[place] ?D: 02/09/2022 12:42:47 pm T: 02/09/2022 10:19:00 pm  ?JOB: [id number]/ [id number]  ?

## 2022-02-09 NOTE — Plan of Care (Signed)
  Problem: Education: Goal: Knowledge of General Education information will improve Description: Including pain rating scale, medication(s)/side effects and non-pharmacologic comfort measures Outcome: Progressing   Problem: Health Behavior/Discharge Planning: Goal: Ability to manage health-related needs will improve Outcome: Progressing   Problem: Clinical Measurements: Goal: Will remain free from infection Outcome: Progressing   

## 2022-02-09 NOTE — Progress Notes (Signed)
?  Transition of Care (TOC) Screening Note ? ? ?Patient Details  ?Name: Jordan [name] ?Date of Birth: 01/25/1941 ? ? ?Transition of Care [facility]) CM/SW Contact:    ?Sharin Mons, RN ?Phone Number: ?02/09/2022, 7:43 AM ? ? ?  - s/p IMN to L hip, 4/10 ? ?Transition of Care Department [facility]) has reviewed patient. Pt from home. PTA independent with ADL's. PT evaluation pending.... ? We will continue to monitor patient advancement through interdisciplinary progression rounds. ? If new patient transition needs arise, please place a TOC consult. ?  ?

## 2022-02-09 NOTE — Telephone Encounter (Signed)
Pt was seen by CY 01/26/22 and order was placed sent to [facility]. Nothing further needed. ?

## 2022-02-09 NOTE — Evaluation (Signed)
Physical Therapy Evaluation ?Patient Details ?Name: Jordan [name] ?MRN: [id number] ?DOB: November 14, 1940 ?Today's Date: 02/09/2022 ? ?History of Present Illness ? Pt is an 81 y.o. male admitted 02/08/22 after fall onto L-hip sustaining L intertrochanteric hip fx. S/p L hip IMN 4/10. Of note, recent admission to 12/18/21 with worsening hallucinations, attributed to pt's Parkinson's disease; d/c to CIR (home 01/17/22), then outpatient neuro eval started 4/4. Other PMH includes OSA, DM2, HTN, CHF, CVA, L2 compression fx (s/p L2 kyphoplasty 12/29/21), frequent falls. ?  ?Clinical Impression ? Pt presents with an overall decrease in functional mobility secondary to above. PTA, pt recently d/c home from CIR (01/17/22); pt ambulatory with RW, wife assists with mobility and ADL/iADLs as needed. Initiated educ re: precautions, positioning, therex, and importance of mobility. Today, pt requiring mod-maxA and stedy lift for standing transfers. Pt limited by generalized weakness, decreased activity tolerance, poor balance strategies/postural reactions and impaired cognition. Pt would benefit from intensive CIR-level services to maximize functional mobility and independence prior to d/c return home; then wife interested in continuing OP neuro PT.     ? ?Recommendations for follow up therapy are one component of a multi-disciplinary discharge planning process, led by the attending physician.  Recommendations may be updated based on patient status, additional functional criteria and insurance authorization. ? ?Follow Up Recommendations Acute inpatient rehab (3hours/day) ? ?  ?Assistance Recommended at Discharge Frequent or constant Supervision/Assistance  ?Patient can return home with the following ? Two people to help with walking and/or transfers;A lot of help with bathing/dressing/bathroom;Assist for transportation ? ?  ?Equipment Recommendations None recommended by PT  ?Recommendations for Other Services ?  Occupational Therapy  ?   ?Functional Status Assessment Patient has had a recent decline in their functional status and demonstrates the ability to make significant improvements in function in a reasonable and predictable amount of time.  ? ?  ?Precautions / Restrictions Precautions ?Precautions: Fall;Other (comment) ?Precaution Comments: Watch SpO2 (does not wear at home) ?Restrictions ?Weight Bearing Restrictions: Yes ?LLE Weight Bearing: Partial weight bearing ?LLE Partial Weight Bearing Percentage or Pounds: 50% PWB  ? ?  ? ?Mobility ? Bed Mobility ?Overal bed mobility: Needs Assistance ?Bed Mobility: Supine to Sit ?  ?  ?Supine to sit: Max assist, HOB elevated ?  ?  ?General bed mobility comments: pt attempting to move LLE to EOB without assist, requiring significant increased time and cues, ultimately requiring maxA for LLE management, modA for trunk elevation and scooting hips to EOB ?  ? ?Transfers ?Overall transfer level: Needs assistance ?Equipment used: Rolling walker (2 wheels) ?Transfers: Sit to/from Stand, Bed to chair/wheelchair/BSC ?Sit to Stand: Max assist, Mod assist ?  ?  ?  ?  ?  ?General transfer comment: initial standing trial from elevated bed height to RW requiring mod verbal cues for sequencing, maxA for trunk elevation and stability; pt attempting side step with RLE, L knee buckling requiring maxA to sit back on EOB; additional standing with stedy frame, heavy modA for trunk elevation with BUEs pulling to standing; minA to stand from stedy seat ?Transfer via Lift Equipment: Stedy ? ?Ambulation/Gait ?  ?  ?  ?  ?  ?  ?  ?  ? ?Stairs ?  ?  ?  ?  ?  ? ?Wheelchair Mobility ?  ? ?Modified Rankin (Stroke Patients Only) ?  ? ?  ? ?Balance Overall balance assessment: Needs assistance ?Sitting-balance support: No upper extremity supported, Feet supported ?Sitting balance-Leahy Scale: Fair ?  ?  ?  ?  Standing balance-Leahy Scale: Poor ?Standing balance comment: reliant on BUE support and external assist ?  ?  ?  ?  ?  ?  ?  ?   ?  ?  ?  ?   ? ? ? ?Pertinent Vitals/Pain Pain Assessment ?Pain Assessment: Faces ?Faces Pain Scale: Hurts even more ?Pain Location: LLE ?Pain Descriptors / Indicators: Discomfort, Grimacing, Sore ?Pain Intervention(s): Monitored during session, Limited activity within patient's tolerance  ? ? ?Home Living Family/patient expects to be discharged to:: Private residence ?Living Arrangements: Spouse/significant other;Children ?Available Help at Discharge: Family ?Type of Home: Apartment ?Home Access: Elevator ?  ?  ?  ?Home Layout: One level ?Home Equipment: Advice worker (2 wheels) ?   ?  ?Prior Function Prior Level of Function : Needs assist ?  ?  ?  ?  ?  ?  ?Mobility Comments: Pt ambulatory limited distances with supervision from wife; wife PRN assist for bed mobility and standing ?ADLs Comments: Wife assists with majority of ADL tasks, especially LB ADL tasks. Wife performs all household tasks ?  ? ? ?Hand Dominance  ?   ? ?  ?Extremity/Trunk Assessment  ? Upper Extremity Assessment ?Upper Extremity Assessment: Overall WFL for tasks assessed ?  ? ?Lower Extremity Assessment ?Lower Extremity Assessment: Generalized weakness;LLE deficits/detail ?LLE Deficits / Details: s/p L hip IMN; hip and knee functionally <3/5 throughout with expected post-op pain and weakness ?LLE: Unable to fully assess due to pain ?LLE Coordination: decreased gross motor ?  ? ?Cervical / Trunk Assessment ?Cervical / Trunk Assessment: Kyphotic  ?Communication  ? Communication: Expressive difficulties  ?Cognition Arousal/Alertness: Awake/alert ?Behavior During Therapy: [facility] for tasks assessed/performed ?Overall Cognitive Status: History of cognitive impairments - at baseline ?  ?  ?  ?  ?  ?  ?  ?  ?  ?  ?  ?  ?  ?  ?  ?  ?General Comments: increased time to follow simple commands, requires frequent repetition, HOH; h/o parkinsonism, suspect difficulty motor planning ?  ?  ? ?  ?General Comments General comments (skin integrity,  edema, etc.): pt's wife [name]) present - requesting CIR, reports, "We want to do anything we can to avoid SNF" ? ?  ?Exercises    ? ?Assessment/Plan  ?  ?PT Assessment Patient needs continued PT services  ?PT Problem List Decreased strength;Decreased range of motion;Decreased activity tolerance;Decreased balance;Decreased mobility;Decreased cognition;Decreased safety awareness;Decreased knowledge of precautions;Pain ? ?   ?  ?PT Treatment Interventions DME instruction;Gait training;Functional mobility training;Therapeutic activities;Therapeutic exercise;Balance training;Patient/family education;Wheelchair mobility training   ? ?PT Goals (Current goals can be found in the Care Plan section)  ?Acute Rehab PT Goals ?Patient Stated Goal: hopeful for rehab at [facility] again ?PT Goal Formulation: With patient/family ?Time For Goal Achievement: 02/23/22 ?Potential to Achieve Goals: Good ? ?  ?Frequency Min 5X/week ?  ? ? ?Co-evaluation   ?  ?  ?  ?  ? ? ?  ?AM-PAC PT "6 Clicks" Mobility  ?Outcome Measure Help needed turning from your back to your side while in a flat bed without using bedrails?: A Lot ?Help needed moving from lying on your back to sitting on the side of a flat bed without using bedrails?: A Lot ?Help needed moving to and from a bed to a chair (including a wheelchair)?: Total ?Help needed standing up from a chair using your arms (e.g., wheelchair or bedside chair)?: A Lot ?Help needed to walk in hospital room?: Total ?Help needed climbing  3-5 steps with a railing? : Total ?6 Click Score: 9 ? ?  ?End of Session Equipment Utilized During Treatment: Gait belt ?Activity Tolerance: Patient tolerated treatment well ?Patient left: in chair;with call bell/phone within reach;with chair alarm set;with family/visitor present ?Nurse Communication: Mobility status;Need for lift equipment ?PT Visit Diagnosis: Other abnormalities of gait and mobility (R26.89);Muscle weakness (generalized) (M62.81);Pain;Repeated falls  (R29.6) ?Pain - Right/Left: Left ?Pain - part of body: Hip ?  ? ?Time: 1021-1173 ?PT Time Calculation (min) (ACUTE ONLY): 39 min ? ? ?Charges:   PT Evaluation ?$PT Eval Moderate Complexity: 1 Mod ?PT Treatments ?$Th

## 2022-02-09 NOTE — Brief Op Note (Signed)
? ?  02/08/2022 ? ?12:39 PM ? ?PATIENT:  Jordan [name]  81 y.o. male ? ?PRE-OPERATIVE DIAGNOSIS:  Left Hip fx ? ?POST-OPERATIVE DIAGNOSIS:  Left Hip fx ? ?PROCEDURE:  Procedure(s): ?INTRAMEDULLARY (IM) NAIL INTERTROCHANTRIC ? ?SURGEON:  Surgeon(s): ?Meredith Pel, MD ? ?ASSISTANT: magnant pa ? ?ANESTHESIA:   general ? ?EBL: 25 ml   ? ?Total I/O ?In: 240 [P.O.:240] ?Out: -  ? ?BLOOD ADMINISTERED: none ? ?DRAINS: none  ? ?LOCAL MEDICATIONS USED: Marcaine morphine clonidine ? ?SPECIMEN:  No Specimen ? ?COUNTS:  YES ? ?TOURNIQUET:  * No tourniquets in log * ? ?DICTATION: .[id number] ? ?Diagnosis ? ?PLAN OF CARE: Admit to inpatient  ? ?PATIENT DISPOSITION:  PACU - hemodynamically stable ? ? ? ? ? ? ? ? ? ? ? ? ?  ?

## 2022-02-09 NOTE — Progress Notes (Signed)
Remote pacemaker transmission.   

## 2022-02-09 NOTE — [place] FL2 (Signed)
?[place] MEDICAID FL2 LEVEL OF CARE SCREENING TOOL  ?  ? ?IDENTIFICATION  ?Patient Name: ?Jordan [name] Birthdate: [birth year] Sex: male Admission Date (Current Location): ?02/08/2022  ?[place] and [place] Number: ? [place] ?  Facility and Address:  ?[facility] [employer] [facility] [place] [address], [place], [place] [address] ?     Provider Number: ?[id number]  ?Attending Physician Name and Address:  ?Allie Bossier, MD ? Relative Name and Phone Number:  ?[name], [name] [phone] ?   ?Current Level of Care: ?Hospital Recommended Level of Care: ?[place] Prior Approval Number: ?  ? ?Date Approved/Denied: ?  PASRR Number: ?[id number] ? ?Discharge Plan: ?SNF ?  ? ?Current Diagnoses: ?Patient Active Problem List  ? Diagnosis Date Noted  ? Closed left hip fracture ([place]) secondary to fall at home 02/08/2022  ? Paroxysmal atrial fibrillation ([place]) 02/08/2022  ? Debility 01/08/2022  ? Constipation 01/07/2022  ? Physical deconditioning 01/07/2022  ? Acute metabolic encephalopathy 34/74/2595  ? Hypomagnesemia 01/04/2022  ? Pancreatitis 12/30/2021  ? Parkinson's disease ([place]) 12/21/2021  ? Parkinson disease ([place]) 12/18/2021  ? Frequent falls 12/18/2021  ? History of CVA (cerebrovascular accident) 12/18/2021  ? 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 Hallucinations, visual 07/30/2021  ? Acetabulum fracture, left ([place]) 06/25/2021  ? Acute hip pain, left 06/24/2021  ? Fall at home, initial encounter 06/24/2021  ? Leukocytosis 06/24/2021  ? Chronic diastolic heart failure ([place])   ? Sinusitis, acute maxillary 06/24/2020  ? AKI (acute kidney injury) ([place])   ? Labile blood glucose   ? Cough   ? Benign essential HTN   ? Skin lesion of hand   ? Hypoalbuminemia due to protein-calorie malnutrition ([place])   ? Controlled type 2 diabetes mellitus with hyperglycemia, without long-term current use of insulin ([place])   ? 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 Morbid obesity ([place])   ? Acute blood loss anemia   ? Acute  thalamic infarction ([place]) 04/15/2020  ? 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 TIA (transient ischemic attack) 02/17/2018  ? Type 2 diabetes mellitus with hyperlipidemia ([place]) 02/17/2018  ? Insomnia 02/01/2018  ? 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 Chronotropic incompetence 07/09/2014  ? Dyspnea on exertion 03/03/2014  ? HLD (hyperlipidemia) 12/13/2007  ? Essential hypertension 12/13/2007  ? Class 1 obesity 12/05/2007  ? 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 Coronary atherosclerosis 12/05/2007  ? ? ?Orientation RESPIRATION BLADDER Height & Weight   ?  ?Self, Situation, Place ? O2 Continent Weight: 224 lb 3.3 oz (101.7 kg) ?Height:  '5\' 9"'$  (175.3 cm)  ?BEHAVIORAL SYMPTOMS/MOOD NEUROLOGICAL BOWEL NUTRITION STATUS  ?    Continent Diet (see discharge summary)  ?AMBULATORY STATUS COMMUNICATION OF NEEDS Skin   ?Total Care Verbally Surgical wounds ?  ?  ?  ?    ?     ?     ? ? ?Personal Care Assistance Level of Assistance  ?Bathing, Feeding, Dressing Bathing Assistance: Maximum assistance ?Feeding assistance: Limited assistance ?Dressing Assistance: Maximum assistance ?   ? ?Functional Limitations Info  ?Sight, Hearing, Speech Sight Info: Adequate ?Hearing Info: Adequate ?Speech Info: Adequate  ? ? ?SPECIAL CARE FACTORS FREQUENCY  ?PT (By licensed PT), OT (By licensed OT)   ?  ?PT Frequency: 5x week ?OT Frequency: 5x week ?  ?  ?  ?   ? ? ?Contractures Contractures Info: Not present  ? ? ?Additional Factors Info  ?Code  Status, Allergies, Insulin Sliding Scale Code Status Info: full ?Allergies Info: NKA ?  ?Insulin Sliding Scale Info: Novolog, 0-9 units TID with meals.  See discharge summary. ?  ?   ? ?Current Medications (02/09/2022):  This is the current hospital active medication list ?Current Facility-Administered Medications  ?Medication Dose Route Frequency Provider Last Rate Last Admin  ? acetaminophen (TYLENOL) tablet  325-650 mg  325-650 mg Oral Q6H PRN Magnant, Charles L, PA-C      ? acidophilus (RISAQUAD) capsule 1 capsule  1 capsule Oral Daily Mansy, Jan A, MD   1 capsule at 02/09/22 9563  ? amantadine (SYMMETREL) capsule 100 mg  100 mg Oral Daily Mansy, Jan A, MD   100 mg at 02/09/22 0809  ? atorvastatin (LIPITOR) tablet 20 mg  20 mg Oral Daily Mansy, Jan A, MD   20 mg at 02/09/22 8756  ? carbidopa-levodopa (SINEMET IR) 25-100 MG per tablet immediate release 1 tablet  1 tablet Oral TID Mansy, Arvella Merles, MD   1 tablet at 02/09/22 1235  ? Carbidopa-Levodopa ER (SINEMET CR) 25-100 MG tablet controlled release 1 tablet  1 tablet Oral QHS Mansy, Arvella Merles, MD   1 tablet at 02/08/22 2138  ? cholecalciferol (VITAMIN D3) tablet 1,000 Units  1,000 Units Oral Daily Mansy, Arvella Merles, MD   1,000 Units at 02/09/22 4332  ? clopidogrel (PLAVIX) tablet 75 mg  75 mg Oral Daily Hammons, Kimberly B, RPH   75 mg at 02/09/22 0809  ? docusate sodium (COLACE) capsule 100 mg  100 mg Oral BID Magnant, Charles L, PA-C   100 mg at 02/09/22 0809  ? feeding supplement (GLUCERNA SHAKE) (GLUCERNA SHAKE) liquid 237 mL  237 mL Oral TID BM Allie Bossier, MD   237 mL at 02/09/22 1347  ? furosemide (LASIX) tablet 20 mg  20 mg Oral Daily Mansy, Jan A, MD   20 mg at 02/09/22 9518  ? hydrALAZINE (APRESOLINE) injection 10 mg  10 mg Intravenous Q4H PRN Norval Morton, MD      ? HYDROcodone-acetaminophen (NORCO/VICODIN) 5-325 MG per tablet 1-2 tablet  1-2 tablet Oral Q6H PRN Mansy, Jan A, MD      ? HYDROmorphone (DILAUDID) injection 0.5 mg  0.5 mg Intravenous Q4H PRN Magnant, Charles L, PA-C      ? oxyCODONE (Oxy IR/ROXICODONE) immediate release tablet 5-10 mg  5-10 mg Oral Q4H PRN Magnant, Charles L, PA-C   5 mg at 02/09/22 1235  ? Or  ? HYDROmorphone (DILAUDID) tablet 1-2 mg  1-2 mg Oral Q4H PRN Magnant, Charles L, PA-C      ? insulin aspart (novoLOG) injection 0-9 Units  0-9 Units Subcutaneous TID WC Smith, Rondell A, MD   5 Units at 02/09/22 1235  ? lidocaine (LIDODERM)  5 % 1 patch  1 patch Transdermal Daily PRN Mansy, Jan A, MD      ? magnesium gluconate (MAGONATE) tablet 250 mg  250 mg Oral QHS Mansy, Jan A, MD   250 mg at 02/08/22 2138  ? magnesium hydroxide (MILK OF MAGNESIA) suspension 30 mL  30 mL Oral Daily PRN Mansy, Jan A, MD      ? melatonin tablet 10 mg  10 mg Oral QHS Mansy, Jan A, MD   10 mg at 02/08/22 2138  ? methocarbamol (ROBAXIN) tablet 500 mg  500 mg Oral Q6H PRN Magnant, Charles L, PA-C   500 mg at 02/09/22 0809  ? Or  ? methocarbamol (ROBAXIN) 500 mg in dextrose  5 % 50 mL IVPB  500 mg Intravenous Q6H PRN Magnant, Charles L, PA-C      ? metoCLOPramide (REGLAN) tablet 5-10 mg  5-10 mg Oral Q8H PRN Magnant, Charles L, PA-C      ? Or  ? metoCLOPramide (REGLAN) injection 5-10 mg  5-10 mg Intravenous Q8H PRN Magnant, Charles L, PA-C      ? metoprolol tartrate (LOPRESSOR) tablet 25 mg  25 mg Oral BID Mansy, Jan A, MD   25 mg at 02/09/22 0071  ? morphine (PF) 2 MG/ML injection 0.5 mg  0.5 mg Intravenous Q2H PRN Mansy, Jan A, MD      ? multivitamin with minerals tablet 1 tablet  1 tablet Oral Daily Mansy, Jan A, MD   1 tablet at 02/09/22 2197  ? nitroGLYCERIN (NITROSTAT) SL tablet 0.4 mg  0.4 mg Sublingual Q5 min PRN Mansy, Jan A, MD      ? omega-3 acid ethyl esters (LOVAZA) capsule 1,000 mg  1,000 mg Oral Daily Mansy, Jan A, MD   1,000 mg at 02/09/22 1022  ? ondansetron (ZOFRAN) injection 4 mg  4 mg Intravenous Q4H PRN Mansy, Jan A, MD      ? polyethylene glycol (MIRALAX / GLYCOLAX) packet 17 g  17 g Oral Daily Mansy, Jan A, MD   17 g at 02/09/22 1022  ? polyvinyl alcohol (LIQUIFILM TEARS) 1.4 % ophthalmic solution 1 drop  1 drop Both Eyes PRN Mansy, Jan A, MD      ? QUEtiapine (SEROQUEL) tablet 12.5 mg  12.5 mg Oral QHS Mansy, Jan A, MD   12.5 mg at 02/08/22 2139  ? senna-docusate (Senokot-S) tablet 2 tablet  2 tablet Oral Q supper Mansy, Jan A, MD      ? traZODone (DESYREL) tablet 25 mg  25 mg Oral QHS PRN Mansy, Arvella Merles, MD      ? Vitamin D (Ergocalciferol) (DRISDOL)  capsule 50,000 Units  50,000 Units Oral Q7 days Mansy, Jan A, MD   50,000 Units at 02/08/22 1018  ? ? ? ?Discharge Medications: ?Please see discharge summary for a list of discharge medications. ? ?Relevant

## 2022-02-09 NOTE — TOC CAGE-AID Note (Signed)
Transition of Care (TOC) - CAGE-AID Screening ? ? ?Patient Details  ?Name: MYCAL CONDE ?MRN: [id number] ?Date of Birth: 1941-10-01 ? ?Transition of Care (TOC) CM/SW Contact:    ?Kile Kabler C Tarpley-Carter, LCSWA ?Phone Number: ?02/09/2022, 2:21 PM ? ? ?Clinical Narrative: ?Pt is unable to participate in Cage Aid. ?CSW will assess pt at a better time. ? ?Passenger transport manager, MSW, LCSW-A ?Pronouns:  She/Her/Hers ?[facility] HealthTransitions of Care ?Clinical Social Worker ?Direct Number:  [phone] ?[email or web address] ? ?CAGE-AID Screening: ?Substance Abuse Screening unable to be completed due to: : Patient unable to participate ? ?  ?  ?  ?  ?  ? ?  ? ?  ? ? ? ? ? ? ?

## 2022-02-09 NOTE — TOC Initial Note (Signed)
Transition of Care (TOC) - Initial/Assessment Note  ? ? ?Patient Details  ?Name: Jordan [name] ?MRN: [id number] ?Date of Birth: July 10, 1941 ? ?Transition of Care [facility]) CM/SW Contact:    ?Joanne Chars, LCSW ?Phone Number: ?02/09/2022, 2:47 PM ? ?Clinical Narrative:   CSW spoke with pt regarding DC recommendation for SNF.  Per epic, pt has had fluctuating orientation but pt appears oriented now and able to participate in discussion.  Pt agreeable to SNF but does ask CSW to speak with wife as well.  Permission given to speak with wife Jordan [name] and son Jordan [name].  Choice document given, permission given to send out referral in hub.  Pt is vaccinated for covid with 2-3 boosters.  ? ?Two attempts made to contact wife Jordan [name], [place] on home phone.               ? ? ?Expected Discharge Plan: [place] ?Barriers to Discharge: Continued Medical Work up, SNF Pending bed offer ? ? ?Patient Goals and CMS Choice ?Patient states their goals for this hospitalization and ongoing recovery are:: "do what I used to do, get back to the [facility]" ?CMS Medicare.gov Compare Post Acute Care list provided to:: Patient ?Choice offered to / list presented to : Patient ? ?Expected Discharge Plan and Services ?Expected Discharge Plan: [place] ?In-house Referral: Clinical Social Work ?  ?Post Acute Care Choice: [place] ?Living arrangements for the past 2 months: [place] ?Expected Discharge Date: 02/12/22               ?  ?  ?  ?  ?  ?  ?  ?  ?  ?  ? ?Prior Living Arrangements/Services ?Living arrangements for the past 2 months: [place] ?Lives with:: Spouse, Adult Children ?Patient language and need for interpreter reviewed:: Yes ?Do you feel safe going back to the place where you live?: Yes      ?Need for Family Participation in Patient Care: Yes (Comment) ?Care giver support system in place?: Yes (comment) ?Current home services: Home OT, Home PT [facility] listed in patient ping, pt said  it was [facility]) ?Criminal Activity/Legal Involvement Pertinent to Current Situation/Hospitalization: No - Comment as needed ? ?Activities of Daily Living ?Home Assistive Devices/Equipment: Wheelchair ?ADL Screening (condition at time of admission) ?Patient's cognitive ability adequate to safely complete daily activities?: Yes ?Is the patient deaf or have difficulty hearing?: No ?Does the patient have difficulty seeing, even when wearing glasses/contacts?: No ?Does the patient have difficulty concentrating, remembering, or making decisions?: Yes ?Patient able to express need for assistance with ADLs?: Yes ?Does the patient have difficulty dressing or bathing?: Yes ?Independently performs ADLs?: No ?Communication: Needs assistance ?Is this a change from baseline?: Pre-admission baseline ?Dressing (OT): Needs assistance ?Is this a change from baseline?: Pre-admission baseline ?Grooming: Needs assistance ?Is this a change from baseline?: Pre-admission baseline ?Feeding: Needs assistance ?Is this a change from baseline?: Pre-admission baseline ?Bathing: Needs assistance ?Is this a change from baseline?: Change from baseline, expected to last <3 days ?Toileting: Needs assistance ?Is this a change from baseline?: Change from baseline, expected to last <3 days ?In/Out Bed: Dependent ?Is this a change from baseline?: Change from baseline, expected to last <3 days ?Walks in Home: Dependent ?Is this a change from baseline?: Change from baseline, expected to last <3 days ?Does the patient have difficulty walking or climbing stairs?: Yes ?Weakness of Legs: Both ?Weakness of Arms/Hands: Both ? ?Permission Sought/Granted ?Permission sought to  share information with : Family Supports ?Permission granted to share information with : Yes, Verbal Permission Granted ? Share Information with NAME: wife Jordan [name], son Jordan [name] ? Permission granted to share info w AGENCY: SNF ?   ?   ? ?Emotional Assessment ?Appearance:: Appears stated  age ?Attitude/Demeanor/Rapport: Engaged ?Affect (typically observed): Appropriate, Pleasant ?Orientation: : Oriented to Self, Oriented to Place, Oriented to Situation ?Alcohol / Substance Use: Not Applicable ?Psych Involvement: No (comment) ? ?Admission diagnosis:  Closed left hip fracture ([place]) [S72.002A] ?Closed intertrochanteric fracture of left hip, initial encounter ([place]) [S72.142A] ?Patient Active Problem List  ? Diagnosis Date Noted  ? Closed left hip fracture ([place]) secondary to fall at home 02/08/2022  ? Paroxysmal atrial fibrillation ([place]) 02/08/2022  ? Debility 01/08/2022  ? Constipation 01/07/2022  ? Physical deconditioning 01/07/2022  ? Acute metabolic encephalopathy 08/31/5944  ? Hypomagnesemia 01/04/2022  ? Pancreatitis 12/30/2021  ? Parkinson's disease ([place]) 12/21/2021  ? Parkinson disease ([place]) 12/18/2021  ? Frequent falls 12/18/2021  ? History of CVA (cerebrovascular accident) 12/18/2021  ? 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 Hallucinations, visual 07/30/2021  ? Acetabulum fracture, left ([place]) 06/25/2021  ? Acute hip pain, left 06/24/2021  ? Fall at home, initial encounter 06/24/2021  ? Leukocytosis 06/24/2021  ? Chronic diastolic heart failure ([place])   ? Sinusitis, acute maxillary 06/24/2020  ? AKI (acute kidney injury) ([place])   ? Labile blood glucose   ? Cough   ? Benign essential HTN   ? Skin lesion of hand   ? Hypoalbuminemia due to protein-calorie malnutrition ([place])   ? Controlled type 2 diabetes mellitus with hyperglycemia, without long-term current use of insulin ([place])   ? 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 Morbid obesity ([place])   ? Acute blood loss anemia   ? Acute thalamic infarction ([place]) 04/15/2020  ? 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 TIA (transient ischemic attack) 02/17/2018  ? Type 2 diabetes mellitus with hyperlipidemia ([place]) 02/17/2018  ? Insomnia 02/01/2018  ? 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 Chronotropic incompetence  07/09/2014  ? Dyspnea on exertion 03/03/2014  ? HLD (hyperlipidemia) 12/13/2007  ? Essential hypertension 12/13/2007  ? Class 1 obesity 12/05/2007  ? 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 Coronary atherosclerosis 12/05/2007  ? ?PCP:  [name], MD ?Pharmacy:   ?[id number] [place], [place] - [address] [place]?Phone: [phone]: [phone]? ?[place]?Phone: [phone]: [phone]? ? ? ? ?Social Determinants of Health (SDOH) Interventions ?  ? ?Readmission Risk Interventions ?   ? View : No data to display.  ?  ?  ?  ? ? ? ?

## 2022-02-10 LAB — GLUCOSE, CAPILLARY
Glucose-Capillary: 132 mg/dL — ABNORMAL HIGH (ref 70–99)
Glucose-Capillary: 153 mg/dL — ABNORMAL HIGH (ref 70–99)
Glucose-Capillary: 183 mg/dL — ABNORMAL HIGH (ref 70–99)
Glucose-Capillary: 192 mg/dL — ABNORMAL HIGH (ref 70–99)
Glucose-Capillary: 236 mg/dL — ABNORMAL HIGH (ref 70–99)
Glucose-Capillary: 256 mg/dL — ABNORMAL HIGH (ref 70–99)
Glucose-Capillary: 285 mg/dL — ABNORMAL HIGH (ref 70–99)
Glucose-Capillary: 324 mg/dL — ABNORMAL HIGH (ref 70–99)

## 2022-02-10 LAB — COMPREHENSIVE METABOLIC PANEL
ALT: 6 U/L (ref 0–44)
AST: 18 U/L (ref 15–41)
Albumin: 2.9 g/dL — ABNORMAL LOW (ref 3.5–5.0)
Alkaline Phosphatase: 55 U/L (ref 38–126)
Anion gap: 6 (ref 5–15)
BUN: 28 mg/dL — ABNORMAL HIGH (ref 8–23)
CO2: 27 mmol/L (ref 22–32)
Calcium: 8.7 mg/dL — ABNORMAL LOW (ref 8.9–10.3)
Chloride: 103 mmol/L (ref 98–111)
Creatinine, Ser: 0.84 mg/dL (ref 0.61–1.24)
GFR, Estimated: >60 mL/min (ref >60)
Glucose, Bld: 189 mg/dL — ABNORMAL HIGH (ref 70–99)
Potassium: 3.9 mmol/L (ref 3.5–5.1)
Sodium: 136 mmol/L (ref 135–145)
Total Bilirubin: 0.5 mg/dL (ref 0.3–1.2)
Total Protein: 5.9 g/dL — ABNORMAL LOW (ref 6.5–8.1)

## 2022-02-10 LAB — CBC WITH DIFFERENTIAL/PLATELET
Abs Immature Granulocytes: 0.06 10*3/uL (ref 0.00–0.07)
Basophils Absolute: 0 10*3/uL (ref 0.0–0.1)
Basophils Relative: 0 %
Eosinophils Absolute: 0.5 10*3/uL (ref 0.0–0.5)
Eosinophils Relative: 5 %
HCT: 33.6 % — ABNORMAL LOW (ref 39.0–52.0)
Hemoglobin: 11.4 g/dL — ABNORMAL LOW (ref 13.0–17.0)
Immature Granulocytes: 1 %
Lymphocytes Relative: 23 %
Lymphs Abs: 2.6 10*3/uL (ref 0.7–4.0)
MCH: 30.4 pg (ref 26.0–34.0)
MCHC: 33.9 g/dL (ref 30.0–36.0)
MCV: 89.6 fL (ref 80.0–100.0)
Monocytes Absolute: 1 10*3/uL (ref 0.1–1.0)
Monocytes Relative: 9 %
Neutro Abs: 7.2 10*3/uL (ref 1.7–7.7)
Neutrophils Relative %: 62 %
Platelets: 199 10*3/uL (ref 150–400)
RBC: 3.75 MIL/uL — ABNORMAL LOW (ref 4.22–5.81)
RDW: 13.7 % (ref 11.5–15.5)
WBC: 11.4 10*3/uL — ABNORMAL HIGH (ref 4.0–10.5)
nRBC: 0 % (ref 0.0–0.2)

## 2022-02-10 LAB — PHOSPHORUS: Phosphorus: 2.8 mg/dL (ref 2.5–4.6)

## 2022-02-10 LAB — MAGNESIUM: Magnesium: 1.9 mg/dL (ref 1.7–2.4)

## 2022-02-10 MED ORDER — INSULIN DETEMIR 100 UNIT/ML ~~LOC~~ SOLN
8.0000 [IU] | Freq: Every day | SUBCUTANEOUS | Status: DC
  Administered 2022-02-10: 8 [IU] via SUBCUTANEOUS
  Filled 2022-02-10 (×2): qty 0.08

## 2022-02-10 NOTE — Evaluation (Addendum)
Occupational Therapy Evaluation Patient Details Name: Jordan [name] MRN: [id number] DOB: 22-Nov-1940 Today's Date: 02/10/2022   History of Present Illness Pt is an 81 y.o. male admitted 02/08/22 after fall onto L-hip sustaining L intertrochanteric hip fx. S/p L hip IMN 4/10. Of note, recent admission to 12/18/21 with worsening hallucinations, attributed to pt's Parkinson's disease; d/c to [facility] (home 01/17/22), then outpatient neuro eval started 4/4. Other PMH includes OSA, DM2, HTN, CHF, CVA, L2 compression fx (s/p L2 kyphoplasty 12/29/21), frequent falls.   Clinical Impression   PTA, pt requiring assist with ADLs, increased assistance with LB ADLs. Pt using RW at baseline for mobility. Pt currently requiring min -mod A for ADLs, mod A +2 for bed mobility and transfers using Stedy and RW. Pt requiring increased cuing and redirection to perform tasks during session. Pt presenting with impairments listed below, will follow acutely. Recommend AIR at d/c.     Recommendations for follow up therapy are one component of a multi-disciplinary discharge planning process, led by the attending physician.  Recommendations may be updated based on patient status, additional functional criteria and insurance authorization.   Follow Up Recommendations  Acute inpatient rehab (3hours/day)    Assistance Recommended at Discharge Intermittent Supervision/Assistance  Patient can return home with the following A lot of help with walking and/or transfers;A lot of help with bathing/dressing/bathroom;Assistance with cooking/housework;Assist for transportation;Help with stairs or ramp for entrance    Functional Status Assessment  Patient has had a recent decline in their functional status and demonstrates the ability to make significant improvements in function in a reasonable and predictable amount of time.  Equipment Recommendations  Other (comment);None recommended by OT (defer to next venue of care)    Recommendations  for Other Services Rehab consult;PT consult     Precautions / Restrictions Precautions Precautions: Fall;Other (comment) Precaution Comments: Watch SpO2 (does not wear at home) Restrictions Weight Bearing Restrictions: Yes LLE Weight Bearing: Partial weight bearing LLE Partial Weight Bearing Percentage or Pounds: 50      Mobility Bed Mobility Overal bed mobility: Needs Assistance Bed Mobility: Supine to Sit     Supine to sit: Mod assist, +2 for physical assistance     General bed mobility comments: pt attempting to move LLE with RLE pushing it toward EOB, pt requiring mod-max cuing and assist for rolling to side, use of bedrail, and trunk elevation    Transfers Overall transfer level: Needs assistance Equipment used: Rolling walker (2 wheels) Transfers: Sit to/from Stand, Bed to chair/wheelchair/BSC Sit to Stand: Mod assist, +2 physical assistance           General transfer comment: able to pull self up with Stedy, cues to remain upright in standing, increased assistance needed standing from lower surface/chair with RW Transfer via Lift Equipment: Stedy    Balance Overall balance assessment: Needs assistance Sitting-balance support: No upper extremity supported, Feet supported Sitting balance-Leahy Scale: Fair Sitting balance - Comments: able to reach down towards feet in chair without LOB   Standing balance support: Reliant on assistive device for balance, Bilateral upper extremity supported Standing balance-Leahy Scale: Poor Standing balance comment: reliant on BUE support and external assist                           ADL either performed or assessed with clinical judgement   ADL Overall ADL's : Needs assistance/impaired Eating/Feeding: Set up;Sitting   Grooming: Set up;Sitting;Wash/dry hands;Wash/dry face Grooming Details (indicate cue type and  reason): washes face sitting up in chair Upper Body Bathing: Minimal assistance;Sitting   Lower Body  Bathing: Moderate assistance;Sitting/lateral leans;Sit to/from stand   Upper Body Dressing : Minimal assistance;Sitting Upper Body Dressing Details (indicate cue type and reason): dons/doffs gown Lower Body Dressing: Minimal assistance Lower Body Dressing Details (indicate cue type and reason): simulated donning socks up in chair Toilet Transfer: Moderate assistance;+2 for physical assistance;+2 for safety/equipment Toilet Transfer Details (indicate cue type and reason): use of stedy Toileting- Clothing Manipulation and Hygiene: Maximal assistance;Sitting/lateral lean       Functional mobility during ADLs: Moderate assistance;Cueing for safety;Rolling walker (2 wheels);+2 for physical assistance       Vision Baseline Vision/History: 1 Wears glasses Vision Assessment?: No apparent visual deficits     Perception     Praxis      Pertinent Vitals/Pain Pain Assessment Pain Assessment: Faces Pain Score: 5  Faces Pain Scale: Hurts little more Pain Location: LLE Pain Descriptors / Indicators: Discomfort, Grimacing, Sore Pain Intervention(s): Limited activity within patient's tolerance, Monitored during session, Repositioned, Premedicated before session, Patient requesting pain meds-RN notified     Hand Dominance     Extremity/Trunk Assessment Upper Extremity Assessment Upper Extremity Assessment: Generalized weakness   Lower Extremity Assessment Lower Extremity Assessment: Defer to PT evaluation   Cervical / Trunk Assessment Cervical / Trunk Assessment: Kyphotic   Communication Communication Communication: No difficulties   Cognition Arousal/Alertness: Awake/alert Behavior During Therapy: WFL for tasks assessed/performed Overall Cognitive Status: History of cognitive impairments - at baseline                                 General Comments: increased time needed to follow single step instructionperseverative with tasks at times requiring frequent  redirection     General Comments  VSS on RA    Exercises     Shoulder Instructions      Home Living Family/patient expects to be discharged to:: Private residence Living Arrangements: Spouse/significant other;Children Available Help at Discharge: Family Type of Home: Apartment Home Access: Elevator     Home Layout: One level     Bathroom Shower/Tub: Tub/shower unit;Walk-in shower   Bathroom Toilet: Handicapped height     Home Equipment: Pharmacist, hospital (2 wheels)          Prior Functioning/Environment Prior Level of Function : Needs assist             Mobility Comments: Pt ambulatory limited distances with supervision from wife; wife PRN assist for bed mobility and standing ADLs Comments: Wife assists with majority of ADL tasks, especially LB ADL tasks. Wife performs all household tasks        OT Problem List: Decreased strength;Decreased range of motion;Decreased activity tolerance;Impaired balance (sitting and/or standing);Decreased knowledge of use of DME or AE;Decreased knowledge of precautions;Decreased safety awareness      OT Treatment/Interventions: Self-care/ADL training;Therapeutic exercise;DME and/or AE instruction;Therapeutic activities;Patient/family education;Balance training;Energy conservation    OT Goals(Current goals can be found in the care plan section) Acute Rehab OT Goals Patient Stated Goal: none stated OT Goal Formulation: With patient Time For Goal Achievement: 02/24/22 Potential to Achieve Goals: Fair ADL Goals Pt Will Perform Grooming: with min assist;standing Pt Will Perform Upper Body Dressing: with min guard assist;sitting Pt Will Perform Lower Body Dressing: with adaptive equipment;sit to/from stand;sitting/lateral leans;with min assist;with caregiver independent in assisting Pt Will Transfer to Toilet: with min assist;ambulating;bedside commode;squat pivot transfer;stand pivot transfer  OT Frequency: Min 2X/week     Co-evaluation PT/OT/SLP Co-Evaluation/Treatment: Yes Reason for Co-Treatment: Complexity of the patient's impairments (multi-system involvement);For patient/therapist safety;To address functional/ADL transfers   OT goals addressed during session: ADL's and self-care;Proper use of Adaptive equipment and DME      AM-PAC OT "6 Clicks" Daily Activity     Outcome Measure Help from another person eating meals?: None Help from another person taking care of personal grooming?: A Little Help from another person toileting, which includes using toliet, bedpan, or urinal?: A Lot Help from another person bathing (including washing, rinsing, drying)?: A Lot Help from another person to put on and taking off regular upper body clothing?: A Little Help from another person to put on and taking off regular lower body clothing?: A Lot 6 Click Score: 16   End of Session Equipment Utilized During Treatment: Gait belt;Rolling walker (2 wheels);Other (comment) (stedy) Nurse Communication: Mobility status;Patient requests pain meds  Activity Tolerance: Patient tolerated treatment well Patient left: in chair;with call bell/phone within reach;with chair alarm set  OT Visit Diagnosis: Unsteadiness on feet (R26.81);Other abnormalities of gait and mobility (R26.89);Muscle weakness (generalized) (M62.81)                Time: 2952-8413 OT Time Calculation (min): 33 min Charges:  OT General Charges $OT Visit: 1 Visit OT Evaluation $OT Eval Moderate Complexity: 1 [address] OTD, OTR/L Acute Rehab [phone]) [phone]  Mayer Masker 02/10/2022, 12:23 PM

## 2022-02-10 NOTE — TOC Progression Note (Signed)
Transition of Care (TOC) - Progression Note  ? ? ?Patient Details  ?Name: GAINES CARTMELL ?MRN: [id number] ?Date of Birth: June 30, 1941 ? ?Transition of Care (TOC) CM/SW Contact  ?Joanne Chars, LCSW ?Phone Number: ?02/10/2022, 10:13 AM ? ?Clinical Narrative:   CSW spoke with pt wife [name] regarding SNF option.  [name] strongly says that the level of care at SNF is unacceptable.  Pt was admitted at CIR last month and she wants pt to return to CIR.  CSW informed her that pt has already been screened for CIR and he does not qualify at this time.  [name] asking to speak to the MD from CIR.   ? ?CSW spoke with Mickel Baas at [facility] and either she or the MD will reach out to pt wife.  ? ? ? ?Expected Discharge Plan: [place] ?Barriers to Discharge: Continued Medical Work up, SNF Pending bed offer ? ?Expected Discharge Plan and Services ?Expected Discharge Plan: [place] ?In-house Referral: Clinical Social Work ?  ?Post Acute Care Choice: [place] ?Living arrangements for the past 2 months: [place] ?Expected Discharge Date: 02/12/22               ?  ?  ?  ?  ?  ?  ?  ?  ?  ?  ? ? ?Social Determinants of Health (SDOH) Interventions ?  ? ?Readmission Risk Interventions ?   ? View : No data to display.  ?  ?  ?  ? ? ?

## 2022-02-10 NOTE — Progress Notes (Signed)
Physical Therapy Treatment ?Patient Details ?Name: Jordan [name] ?MRN: [id number] ?DOB: 1941-02-08 ?Today's Date: 02/10/2022 ? ? ?History of Present Illness Pt is an 81 y.o. male admitted 02/08/22 after fall onto L-hip sustaining L intertrochanteric hip fx. S/p L hip IMN 4/10. Of note, recent admission to 12/18/21 with worsening hallucinations, attributed to pt's Parkinson's disease; d/c to CIR (home 01/17/22), then outpatient neuro eval started 4/4. Other PMH includes OSA, DM2, HTN, CHF, CVA, L2 compression fx (s/p L2 kyphoplasty 12/29/21), frequent falls. ?  ?PT Comments  ? ? Pt progressing with mobility. Today's session focused on standing trials with and without stedy frame, pt requiring modA+2; pt with L knee buckling with attempts at taking steps. Pt remains limited by generalized weakness, decreased activity tolerance, pain, poor balance strategies/postural reactions and impaired cognition. Pt pleasant and motivated to participate despite pain. Continue to recommend intensive CIR-level therapies to maximize functional mobility and independence prior to return home. ?   ?Recommendations for follow up therapy are one component of a multi-disciplinary discharge planning process, led by the attending physician.  Recommendations may be updated based on patient status, additional functional criteria and insurance authorization. ? ?Follow Up Recommendations ? Acute inpatient rehab (3hours/day) ?  ?  ?Assistance Recommended at Discharge Frequent or constant Supervision/Assistance  ?Patient can return home with the following Two people to help with walking and/or transfers;A lot of help with bathing/dressing/bathroom;Assist for transportation;Help with stairs or ramp for entrance;Assistance with cooking/housework ?  ?Equipment Recommendations ? None recommended by PT  ?  ?Recommendations for Other Services   ? ? ?  ?Precautions / Restrictions Precautions ?Precautions: Fall ?Precaution Comments: Watch SpO2 (does not wear at  home) ?Restrictions ?Weight Bearing Restrictions: Yes ?LLE Weight Bearing: Partial weight bearing ?LLE Partial Weight Bearing Percentage or Pounds: 50  ?  ? ?Mobility ? Bed Mobility ?Overal bed mobility: Needs Assistance ?Bed Mobility: Supine to Sit ?  ?  ?Supine to sit: Mod assist, +2 for physical assistance ?  ?  ?General bed mobility comments: pt attempting to move LLE with RLE pushing it toward EOB, pt requiring mod-max cuing, modA+2 for BLE management, trunk elevation and scooting hips to EOB ?  ? ?Transfers ?Overall transfer level: Needs assistance ?Equipment used: Rolling walker (2 wheels) ?Transfers: Sit to/from Stand, Bed to chair/wheelchair/BSC ?Sit to Stand: Mod assist, +2 physical assistance ?  ?  ?  ?  ?  ?General transfer comment: modA+2 for trunk elevation standing into stedy frame with BUE supprt pulling on bar, good carryover of sequencing with stedy from prior session; additional sit<>stand from recliner, cues for hand placement, heavy modA+2 for trunk elevation ?  ? ?Ambulation/Gait ?  ?  ?  ?  ?  ?  ?Pre-gait activities: attempted single step with each foot while standing in front of recliner with RW; with R foot step, pt with L knee buckling requiring mod-maxA to correct; pt unable to take complete step with L foot, able to slide it minimally ?  ? ? ?Stairs ?  ?  ?  ?  ?  ? ? ?Wheelchair Mobility ?  ? ?Modified Rankin (Stroke Patients Only) ?  ? ? ?  ?Balance Overall balance assessment: Needs assistance ?Sitting-balance support: No upper extremity supported, Feet supported ?Sitting balance-Leahy Scale: Fair ?  ?  ?Standing balance support: Reliant on assistive device for balance, Bilateral upper extremity supported ?Standing balance-Leahy Scale: Poor ?Standing balance comment: reliant on BUE support and external assist ?  ?  ?  ?  ?  ?  ?  ?  ?  ?  ?  ?  ? ?  ?  Cognition Arousal/Alertness: Awake/alert ?Behavior During Therapy: [facility] for tasks assessed/performed ?Overall Cognitive Status: History of  cognitive impairments - at baseline ?  ?  ?  ?  ?  ?  ?  ?  ?  ?  ?  ?  ?  ?  ?  ?  ?General Comments: increased time needed to follow single step instruction; perseverative with tasks at times requiring frequent redirection; pt aware of short-term memory deficits, reports he does not remember nursing helping him back to bed yesterday. pleasant and motivated ?  ?  ? ?  ?Exercises Other Exercises ?Other Exercises: pt able to minimally extend L knee when sitting edge of recliner, although improved since yesterday's session; able to perform ankle pumps, quad sets ? ?  ?General Comments General comments (skin integrity, edema, etc.): VSS on RA ?  ?  ? ?Pertinent Vitals/Pain Pain Assessment ?Pain Assessment: Faces ?Faces Pain Scale: Hurts little more ?Pain Location: LLE ?Pain Descriptors / Indicators: Discomfort, Grimacing, Sore ?Pain Intervention(s): Premedicated before session, Repositioned, Patient requesting pain meds-RN notified  ? ? ?Home Living Family/patient expects to be discharged to:: Private residence ?Living Arrangements: Spouse/significant other;Children ?Available Help at Discharge: Family ?Type of Home: Apartment ?Home Access: Elevator ?  ?  ?  ?Home Layout: One level ?Home Equipment: Advice worker (2 wheels) ?   ?  ?Prior Function    ?  ?  ?   ? ?PT Goals (current goals can now be found in the care plan section) Progress towards PT goals: Progressing toward goals ? ?  ?Frequency ? ? ? Min 5X/week ? ? ? ?  ?PT Plan Current plan remains appropriate  ? ? ?Co-evaluation PT/OT/SLP Co-Evaluation/Treatment: Yes ?Reason for Co-Treatment: Complexity of the patient's impairments (multi-system involvement);For patient/therapist safety;To address functional/ADL transfers ?PT goals addressed during session: Mobility/safety with mobility;Balance ?OT goals addressed during session: ADL's and self-care;Proper use of Adaptive equipment and DME ?  ? ?  ?AM-PAC PT "6 Clicks" Mobility   ?Outcome Measure ? Help  needed turning from your back to your side while in a flat bed without using bedrails?: A Lot ?Help needed moving from lying on your back to sitting on the side of a flat bed without using bedrails?: A Lot ?Help needed moving to and from a bed to a chair (including a wheelchair)?: Total ?Help needed standing up from a chair using your arms (e.g., wheelchair or bedside chair)?: Total ?Help needed to walk in hospital room?: Total ?Help needed climbing 3-5 steps with a railing? : Total ?6 Click Score: 8 ? ?  ?End of Session Equipment Utilized During Treatment: Gait belt ?Activity Tolerance: Patient tolerated treatment well ?Patient left: in chair;with call bell/phone within reach;with chair alarm set ?Nurse Communication: Mobility status;Need for lift equipment ?PT Visit Diagnosis: Other abnormalities of gait and mobility (R26.89);Muscle weakness (generalized) (M62.81);Pain;Repeated falls (R29.6) ?Pain - Right/Left: Left ?Pain - part of body: Hip ?  ? ? ?Time: 0301-3143 ?PT Time Calculation (min) (ACUTE ONLY): 28 min ? ?Charges:  $Therapeutic Activity: 8-22 mins          ?          ? ?Mabeline Caras, PT, DPT ?Acute Rehabilitation Services  ?Pager [phone] ?Office [phone] ? ?Derry Lory ?02/10/2022, 1:09 PM ? ?

## 2022-02-10 NOTE — Progress Notes (Signed)
Inpatient Rehab Admissions Coordinator:  ? ?Spoke with TOC and ortho PA. Pt.'s wife really wants Pt. To return to CIR and is declining SNF. Note that  insurance is unlikely to approve  CIR this diagnosis, but I will  go ahead and order for rehab consult per protocol for full assessment. Please contact me any with questions. ? ?Clemens Catholic, MS, CCC-SLP ?Rehab Admissions Coordinator  ?[phone] (celll) ?[phone] (office) ? ?

## 2022-02-10 NOTE — Progress Notes (Signed)
?PROGRESS NOTE ? ? ? ?Jordan [name]  YQI:[id number] DOB: 11-Dec-1940 DOA: 02/08/2022 ?PCP: [name], MD  ? ? ? ?Brief Narrative:  ?81 y.o. WM PMHx HTN, Diastolic CHF, CAD, bradycardia  s/p PPM, type 2 diabetes mellitus, Parkinson's disease, gout, Narcolepsy, and OSA  ? ?Presents after having a fall at home.  Patient lives at home with his wife and normally ambulates with the use of a walker.  He states that his wife told him never to bend over, but bent over last night to pick up batteries that have fallen out of the remote when he lost his balance landing on his left hip.  He thinks he hit his head, but did not report any loss of consciousness.  After fall he was unable to bear weight on the left leg and reports being in severe pain.  He has had a intermittent cough ever since being hospitalized last month with pancreatitis with proximal sigmoid diverticulitis.  After that hospitalization he was sent to rehab prior to him being discharged home. ?  ?Upon admission into the emergency department patient was noted to have stable vital signs.  X-rays of the left hip and pelvis significant for mildly displaced intertrochanteric fracture of the proximal left femur.  Labs significant for WBC 14.9, BUN 24, creatinine 0.93, glucose 175 and all other vital signs maintained.  All other imaging noted chronic changes without any acute fractures or abnormality appreciated.  Patient had been given morphine for pain.  Case was discussed with Dr. Marlou Sa of orthopedics who plans on surgery this afternoon. ? ? ?Subjective: ?3/12 A/O x4.  Pain controlled.  Some short-term memory loss.  Mild dementia.  Wife states that she will not be at hospital tomorrow and requests I have NCM contact her via phone. ? ?  ? ?Assessment & Plan: ?Covid vaccination; ?  ?Principal Problem: ?  Closed left hip fracture ([place]) secondary to fall at home ?Active Problems: ?  Leukocytosis ?  Chronic diastolic heart failure ([place]) ?  History of CVA  (cerebrovascular accident) ?  Essential hypertension ?  HLD (hyperlipidemia) ?  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  Coronary atherosclerosis ?  Controlled type 2 diabetes mellitus with hyperglycemia, without long-term current use of insulin ([place]) ?  Fall at home, initial encounter ?  Parkinson disease ([place]) ?  Paroxysmal atrial fibrillation ([facility]) ? ?Closed left hip fracture ([place]) secondary to fall at home ?Acute.  Patient presents after having fallen at home found to have acute mildly displaced intertrochanteric left femur fracture on imaging.  Case was discussed with Dr. Marlou Sa who is undergoing surgery this afternoon. ?-Admit to a surgery telemetry bed ?-Hip fracture order set utilized ?-N.p.o. prior to surgery with orders placed to advance diet on procedure ?-Nonweightbearing on affected leg ?-Hydrocodone/morphine as needed for moderate to severe pain respectively ?-Bowel regimen ?-4/12 wife still holding CIR will reconsider. ?  ?Leukocytosis ?Acute.  WBC elevated 14.9.    Chest x-ray did not note any acute abnormalities. Suspect secondary to acute fracture. ?-Check urinalysis ?-Recheck CBC tomorrow morning ?  ?Chronic diastolic heart failure ([place]) ?Patient with a diastolic CHF last EF noted to be 60-65% with indeterminate diastolic dysfunction on 01/07/7563.  No signs of fluid overload noted on physical exam. ?-Strict INO ?- Daily weight ?-Continue furosemide 20 mg daily ?  ?Essential hypertension ?On admission blood pressure appears relatively controlled.  Home medication regimen includes metoprolol 25 mg twice daily and furosemide 20 mg daily. ?-Continue home regimen as tolerated ?  ?  Paroxysmal atrial fibrillation (H[facility]) ?Patient was noted during last hospitalization and being in atrial fibrillation for 4 hours, but thought to be in the setting of infection.  No other events were reported.  He was not started on anticoagulation at that time. ?-Currently NSR ?  ?Parkinson disease ([place]) ?During prior hospitalization patient  had noted to have hallucinations. ?-Delirium precautions ?-Continue Sinemet, amantadine, and Seroquel   ?  ?DM type II controlled with hyperglycemia, without long-term current use of insulin ([place]) ?Patient well controlled.  Last hemoglobin A1c 6.1 on 3/2.  On admission glucose 175.  Home medication regimen includes metformin 1000 mg daily. ?-4/11 moderate SSI ?-4/12 Levemir units daily ?-Hold metformin ? ?History of CVA (cerebrovascular accident) ?-Continue Plavix and statin ?  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  ?HLD (hyperlipidemia) ?Home regimen includes atorvastatin 20 mg daily, and Lovaza 1000 mg daily. ?-Continue atorvastatin and Lovaza ? ?Obese (BMI 33.1 kg/m?) ?  ? ? ?  ?Mobility Assessment (last 72 hours)   ? ? Mobility Assessment   ? ? [place] Name 02/10/22 1308 02/10/22 1100 02/10/22 1048 02/09/22 1900 02/09/22 1231  ? Does patient have an order for bedrest or is patient medically unstable -- -- No - Continue assessment No - Continue assessment --  ? What is the highest level of mobility based on the progressive mobility assessment? Level 3 (Stands with assist) - Balance while standing  and cannot march in place Level 4 (Walks with assist in room) - Balance while marching in place and cannot step forward and back - Complete Level 4 (Walks with assist in room) - Balance while marching in place and cannot step forward and back - Complete Level 3 (Stands with assist) - Balance while standing  and cannot march in place Level 3 (Stands with assist) - Balance while standing  and cannot march in place  with stedy  ? Is the above level different from baseline mobility prior to current illness? -- -- Yes - Recommend PT order Yes - Recommend PT order --  ? ? [place] Name 02/09/22 0855 02/08/22 2050 02/08/22 1935  ?  ?  ? Does patient have an order for bedrest or is patient medically unstable No - Continue assessment No - Continue assessment No - Continue assessment    ? What is the highest level of mobility  based on the progressive mobility assessment? Level 3 (Stands with assist) - Balance while standing  and cannot march in place Level 1 (Bedfast) - Unable to balance while sitting on edge of bed Level 1 (Bedfast) - Unable to balance while sitting on edge of bed    ? Is the above level different from baseline mobility prior to current illness? -- Yes - Recommend PT order Yes - Recommend PT order    ? ?  ?  ? ?  ? ? ?DVT prophylaxis:  ?Code Status: Full ?Family Communication:  ?Status is: Inpatient ? ? ? ?Dispo: The patient is from: Home ?             Anticipated d/c is to: Home ?             Anticipated d/c date is: 2 days ?             Patient currently is not medically stable to d/c. ? ? ? ? ? ?Consultants:  ?Orthopedic surgery ? ? ?Procedures/Significant Events:  ?4/11 LEFT Hip INTRAMEDULLARY (IM) NAIL INTERTROCHANTRIC ? ? ?I have personally reviewed and interpreted all  radiology studies and my findings are as above. ? ?VENTILATOR SETTINGS: ? ? ? ?Cultures ? ? ?Antimicrobials: ?Anti-infectives (From admission, onward)  ? ? Start     Dose/Rate Route Frequency Ordered Stop  ? 02/09/22 0600  ceFAZolin (ANCEF) IVPB 2g/100 mL premix       ? 2 g ?200 mL/hr over 30 Minutes Intravenous On call to O.R. 02/08/22 1334 02/08/22 1523  ? 02/08/22 2200  ceFAZolin (ANCEF) IVPB 2g/100 mL premix       ? 2 g ?200 mL/hr over 30 Minutes Intravenous Every 8 hours 02/08/22 1854 02/09/22 0609  ? ?  ?  ? ? ?Devices ?  ? ?LINES / TUBES:  ? ? ? ? ?Continuous Infusions: ? methocarbamol (ROBAXIN) IV    ? ? ? ?Objective: ?Vitals:  ? 02/10/22 0718 02/10/22 0836 02/10/22 1453 02/10/22 1920  ?BP: 124/71 117/64 123/72 131/68  ?Pulse: 73 69 66 65  ?Resp: 17   18  ?Temp: 98 ?F (36.7 ?C)  97.9 ?F (36.6 ?C) 97.6 ?F (36.4 ?C)  ?TempSrc: Oral  Oral Oral  ?SpO2: 91%  92% 93%  ?Weight:      ?Height:      ? ? ?Intake/Output Summary (Last 24 hours) at 02/10/2022 2004 ?Last data filed at 02/10/2022 0900 ?Gross per 24 hour  ?Intake 120 ml  ?Output 1250 ml  ?Net  -1130 ml  ? ? ?Filed Weights  ? 02/08/22 0039 02/09/22 0500  ?Weight: 96.6 kg 101.7 kg  ? ? ?Examination: ? ?General: No acute respiratory distress ?Eyes: negative scleral hemorrhage, negative anisocoria, negativ

## 2022-02-10 NOTE — Progress Notes (Signed)
Inpatient Rehab Admissions Coordinator:  ? ?Spoke to pt's spouse on the phone to discuss CIR recommendations and insurance process.  They are familiar with our program from admission in March of this year.  Reports frustration that pt wasn't able to get into outpatient therapy until April 24 and is very hopeful that insurance will approve CIR in order for him to not lose ground prior to that appointment.  He is very motivated and made excellent progress on CIR in March.  I will start insurance auth once OT has a chance to see him today and write their evaluation.  I will follow.  ? ?Shann Medal, PT, DPT ?Admissions Coordinator ?[phone] ?02/10/22  ?11:21 AM ? ?

## 2022-02-11 ENCOUNTER — Other Ambulatory Visit: Payer: Self-pay | Admitting: Physical Medicine and Rehabilitation

## 2022-02-11 DIAGNOSIS — S72142A Displaced intertrochanteric fracture of left femur, initial encounter for closed fracture: Secondary | ICD-10-CM | POA: Diagnosis not present

## 2022-02-11 DIAGNOSIS — I2510 Atherosclerotic heart disease of native coronary artery without angina pectoris: Secondary | ICD-10-CM | POA: Diagnosis not present

## 2022-02-11 DIAGNOSIS — I5032 Chronic diastolic (congestive) heart failure: Secondary | ICD-10-CM | POA: Diagnosis not present

## 2022-02-11 DIAGNOSIS — E1165 Type 2 diabetes mellitus with hyperglycemia: Secondary | ICD-10-CM | POA: Diagnosis not present

## 2022-02-11 LAB — CBC WITH DIFFERENTIAL/PLATELET
Abs Immature Granulocytes: 0.04 10*3/uL (ref 0.00–0.07)
Basophils Absolute: 0 10*3/uL (ref 0.0–0.1)
Basophils Relative: 0 %
Eosinophils Absolute: 0.8 10*3/uL — ABNORMAL HIGH (ref 0.0–0.5)
Eosinophils Relative: 8 %
HCT: 33.7 % — ABNORMAL LOW (ref 39.0–52.0)
Hemoglobin: 11.2 g/dL — ABNORMAL LOW (ref 13.0–17.0)
Immature Granulocytes: 0 %
Lymphocytes Relative: 25 %
Lymphs Abs: 2.5 10*3/uL (ref 0.7–4.0)
MCH: 30.1 pg (ref 26.0–34.0)
MCHC: 33.2 g/dL (ref 30.0–36.0)
MCV: 90.6 fL (ref 80.0–100.0)
Monocytes Absolute: 1 10*3/uL (ref 0.1–1.0)
Monocytes Relative: 10 %
Neutro Abs: 5.7 10*3/uL (ref 1.7–7.7)
Neutrophils Relative %: 57 %
Platelets: 200 10*3/uL (ref 150–400)
RBC: 3.72 MIL/uL — ABNORMAL LOW (ref 4.22–5.81)
RDW: 13.5 % (ref 11.5–15.5)
WBC: 10.1 10*3/uL (ref 4.0–10.5)
nRBC: 0 % (ref 0.0–0.2)

## 2022-02-11 LAB — COMPREHENSIVE METABOLIC PANEL
ALT: 15 U/L (ref 0–44)
AST: 19 U/L (ref 15–41)
Albumin: 2.9 g/dL — ABNORMAL LOW (ref 3.5–5.0)
Alkaline Phosphatase: 63 U/L (ref 38–126)
Anion gap: 8 (ref 5–15)
BUN: 20 mg/dL (ref 8–23)
CO2: 25 mmol/L (ref 22–32)
Calcium: 8.8 mg/dL — ABNORMAL LOW (ref 8.9–10.3)
Chloride: 102 mmol/L (ref 98–111)
Creatinine, Ser: 0.96 mg/dL (ref 0.61–1.24)
GFR, Estimated: >60 mL/min (ref >60)
Glucose, Bld: 283 mg/dL — ABNORMAL HIGH (ref 70–99)
Potassium: 3.7 mmol/L (ref 3.5–5.1)
Sodium: 135 mmol/L (ref 135–145)
Total Bilirubin: 0.5 mg/dL (ref 0.3–1.2)
Total Protein: 6 g/dL — ABNORMAL LOW (ref 6.5–8.1)

## 2022-02-11 LAB — GLUCOSE, CAPILLARY
Glucose-Capillary: 108 mg/dL — ABNORMAL HIGH (ref 70–99)
Glucose-Capillary: 115 mg/dL — ABNORMAL HIGH (ref 70–99)
Glucose-Capillary: 181 mg/dL — ABNORMAL HIGH (ref 70–99)
Glucose-Capillary: 191 mg/dL — ABNORMAL HIGH (ref 70–99)
Glucose-Capillary: 219 mg/dL — ABNORMAL HIGH (ref 70–99)
Glucose-Capillary: 223 mg/dL — ABNORMAL HIGH (ref 70–99)

## 2022-02-11 LAB — MAGNESIUM: Magnesium: 1.7 mg/dL (ref 1.7–2.4)

## 2022-02-11 LAB — PHOSPHORUS: Phosphorus: 2.9 mg/dL (ref 2.5–4.6)

## 2022-02-11 MED ORDER — INSULIN DETEMIR 100 UNIT/ML ~~LOC~~ SOLN
12.0000 [IU] | Freq: Every day | SUBCUTANEOUS | Status: DC
  Administered 2022-02-11 – 2022-02-17 (×7): 12 [IU] via SUBCUTANEOUS
  Filled 2022-02-11 (×9): qty 0.12

## 2022-02-11 NOTE — Progress Notes (Signed)
Inpatient Diabetes Program Recommendations ? ?AACE/ADA: New Consensus Statement on Inpatient Glycemic Control (2015) ? ?Target Ranges:  Prepandial:   less than 140 mg/dL ?     Peak postprandial:   less than 180 mg/dL (1-2 hours) ?     Critically ill patients:  140 - 180 mg/dL  ? ?Lab Results  ?Component Value Date  ? GLUCAP 108 (H) 02/11/2022  ? HGBA1C 6.1 (H) 12/31/2021  ? ? ?Review of Glycemic Control ? Latest Reference Range & Units 02/10/22 16:27 02/10/22 19:59 02/10/22 22:33 02/10/22 23:47 02/11/22 04:18  ?Glucose-Capillary 70 - 99 mg/dL 183 (H) 192 (H) 324 (H) 285 (H) 181 (H)  ? ?Diabetes history: DM 2 ?Outpatient Diabetes medications:  ?Metformin 1000 mg q AM ?Current orders for Inpatient glycemic control:  ?Novolog moderate q 4 hours ?Levemir 8 units q HS ? ?Inpatient Diabetes Program Recommendations:   ? ?Consider reducing Novolog correction to tid with meals and HS scale.   ? ?Thanks,  ?Adah Perl, RN, BC-ADM ?Inpatient Diabetes Coordinator ?Pager [phone]  (8a-5p) ? ? ?

## 2022-02-11 NOTE — Progress Notes (Signed)
Inpatient Rehab Admissions Coordinator:  ? ?Received denial from [employer].  I let spouse know and she would like to appeal. I will start this process.   ? ?Shann Medal, PT, DPT ?Admissions Coordinator ?[phone] ?02/11/22  ?3:32 PM ? ?

## 2022-02-11 NOTE — Care Management Important Message (Signed)
Important Message ? ?Patient Details  ?Name: Jordan [name] ?MRN: [id number] ?Date of Birth: November 11, 1940 ? ? ?Medicare Important Message Given:  Yes ? ? ? ? ?Joevanni Roddey ?02/11/2022, 3:11 PM ?

## 2022-02-11 NOTE — Progress Notes (Signed)
?  Subjective: ?Patient is an 81 year old male who presents s/p left hip intramedullary nail placement.  Patient is POD 3.  He is doing well overall.  Denies any chest pain, shortness of breath, calf pain, abdominal pain.  Hip pain continually improving slowly. Has not ambulated yet with PT. Awaiting decision on CIR vs SNF ? ?Objective: ?Vital signs in last 24 hours: ?Temp:  [97.6 ?F (36.4 ?C)-98.2 ?F (36.8 ?C)] 98.2 ?F (36.8 ?C) (04/11 1948) ?Pulse Rate:  [61-77] 76 (04/11 1948) ?Resp:  [15-20] 18 (04/11 1948) ?BP: (95-142)/(53-73) 124/53 (04/11 1948) ?SpO2:  [90 %-96 %] 91 % (04/11 1948) ?Weight:  [101.7 kg] 101.7 kg (04/11 0500) ? ?Intake/Output from previous day: ?04/10 0701 - 04/11 0700 ?In: 1838.2 [P.O.:360; I.V.:1378.2; IV Piggyback:100] ?Out: 100 [Blood:100] ?Intake/Output this shift: ?No intake/output data recorded. ? ?Exam: ? ?2+ DP pulse of the left lower extremity.  Intact ankle dorsiflexion and plantarflexion.  No calf tenderness.  Dressings are clean dry and intact; one dressing will need to be replaced later this evening.  Increased pain with logroll of the lower extremity. ? ?Labs: ?Recent Labs  ?  02/08/22 ?0408 02/09/22 ?9381  ?HGB 13.6 12.8*  ? ?Recent Labs  ?  02/08/22 ?0408 02/09/22 ?0135  ?WBC 14.9* 12.1*  ?RBC 4.54 4.23  ?HCT 41.4 37.3*  ?PLT 242 206  ? ?Recent Labs  ?  02/08/22 ?0408 02/09/22 ?0135  ?NA 138 134*  ?K 4.4 4.6  ?CL 103 102  ?CO2 27 22  ?BUN 24* 17  ?CREATININE 0.93 0.90  ?GLUCOSE 175* 204*  ?CALCIUM 9.5 9.0  ? ?Recent Labs  ?  02/08/22 ?0408  ?INR 1.1  ? ? ?Assessment/Plan: ?Plan is continue with physical therapy.  awaiting decision point for or against CIR based on insurance auth. Aspirin/Plavix for DVT/VTE prophylaxis along with SCDs and TED hose.  Okay for 50% weightbearing with walker.  Follow-up with Dr. Marlou Sa in 10 to 14 days for clinical recheck with new radiographs at that time. ? ? ?Jordan [name] ?02/09/2022, 9:16 PM  ? ? ? ? ?

## 2022-02-11 NOTE — Progress Notes (Signed)
?PROGRESS NOTE ? ? ? ?Jordan [name]  PXT:[id number] DOB: [birth year] DOA: 02/08/2022 ?PCP: [name], MD  ? ? ? ?Brief Narrative:  ?81 y.o. WM PMHx HTN, Diastolic CHF, CAD, bradycardia  s/p PPM, type 2 diabetes mellitus, Parkinson's disease, gout, Narcolepsy, and OSA  ? ?Presents after having a fall at home.  Patient lives at home with his wife and normally ambulates with the use of a walker.  He states that his wife told him never to bend over, but bent over last night to pick up batteries that have fallen out of the remote when he lost his balance landing on his left hip.  He thinks he hit his head, but did not report any loss of consciousness.  After fall he was unable to bear weight on the left leg and reports being in severe pain.  He has had a intermittent cough ever since being hospitalized last month with pancreatitis with proximal sigmoid diverticulitis.  After that hospitalization he was sent to rehab prior to him being discharged home. ?  ?Upon admission into the emergency department patient was noted to have stable vital signs.  X-rays of the left hip and pelvis significant for mildly displaced intertrochanteric fracture of the proximal left femur.  Labs significant for WBC 14.9, BUN 24, creatinine 0.93, glucose 175 and all other vital signs maintained.  All other imaging noted chronic changes without any acute fractures or abnormality appreciated.  Patient had been given morphine for pain.  Case was discussed with Dr. Marlou Sa of orthopedics who plans on surgery this afternoon. ? ? ?Subjective: ?4/13 A/O x4.  Pain controlled.  Some short-term memory loss.  Mild dementia.  Patient does not recall telling RN that he slept well last night. ? ? ?  ? ?Assessment & Plan: ?Covid vaccination; ?  ?Principal Problem: ?  Closed left hip fracture ([place]) secondary to fall at home ?Active Problems: ?  Leukocytosis ?  Chronic diastolic heart failure ([place]) ?  History of CVA (cerebrovascular accident) ?  Essential  hypertension ?  HLD (hyperlipidemia) ?  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  Coronary atherosclerosis ?  Controlled type 2 diabetes mellitus with hyperglycemia, without long-term current use of insulin ([place]) ?  Fall at home, initial encounter ?  Parkinson disease ([place]) ?  Paroxysmal atrial fibrillation ([facility]) ? ?Closed left hip fracture ([place]) secondary to fall at home ?Acute.  Patient presents after having fallen at home found to have acute mildly displaced intertrochanteric left femur fracture on imaging.  Case was discussed with Dr. Marlou Sa who is undergoing surgery this afternoon. ?-Admit to a surgery telemetry bed ?-Hip fracture order set utilized ?-N.p.o. prior to surgery with orders placed to advance diet on procedure ?-Nonweightbearing on affected leg ?-Hydrocodone/morphine as needed for moderate to severe pain respectively ?-Bowel regimen ?-4/12 wife still holding CIR will reconsider. ?-4/13 patient's request to CIR has been DENIED.  They are appealing very unlikely to be overturned. ?- 4/13 have requested that RN obtain surgeon protocol for PT for left hip in order that patient can at least begin to get some work accomplished.  Ambulate daily with help ? ?Leukocytosis ?Acute.  WBC elevated 14.9.    Chest x-ray did not note any acute abnormalities. Suspect secondary to acute fracture. ?-Check urinalysis ?-4/13 resolved ?  ?Chronic diastolic heart failure ([place]) ?Patient with a diastolic CHF last EF noted to be 60-65% with indeterminate diastolic dysfunction on 04/02/7034.  No signs of fluid overload noted on physical exam. ?-Strict INO ?-  Daily weight ?-Continue furosemide 20 mg daily ?  ?Essential hypertension ?On admission blood pressure appears relatively controlled.  Home medication regimen includes metoprolol 25 mg twice daily and furosemide 20 mg daily. ?-Continue home regimen as tolerated ?  ?Paroxysmal atrial fibrillation (H[facility]) ?-Patient was noted during last hospitalization and being in atrial fibrillation for 4  hours, but thought to be in the setting of infection.  No other events were reported.  He was not started on anticoagulation at that time. ?-Currently NSR ?  ?Parkinson disease ([place]) ?During prior hospitalization patient had noted to have hallucinations. ?-Delirium precautions ?-Continue Sinemet, amantadine, and Seroquel   ?  ?DM type II controlled with hyperglycemia, without long-term current use of insulin ([place]) ?Patient well controlled.  Last hemoglobin A1c 6.1 on 3/2.  On admission glucose 175.  Home medication regimen includes metformin 1000 mg daily. ?-4/11 moderate SSI ?- 4/13 increase Levemir 12 units daily ?-Hold metformin ? ?History of CVA (cerebrovascular accident) ?-Continue Plavix and statin ?  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  ?HLD (hyperlipidemia) ?Home regimen includes atorvastatin 20 mg daily, and Lovaza 1000 mg daily. ?-Continue atorvastatin and Lovaza ? ?Obese (BMI 33.1 kg/m?) ?  ? ? ?  ?Mobility Assessment (last 72 hours)   ? ? Mobility Assessment   ? ? [place] Name 02/11/22 1400 02/11/22 0900 02/10/22 1308 02/10/22 1100 02/10/22 1048  ? Does patient have an order for bedrest or is patient medically unstable -- No - Continue assessment -- -- No - Continue assessment  ? What is the highest level of mobility based on the progressive mobility assessment? Level 3 (Stands with assist) - Balance while standing  and cannot march in place Level 3 (Stands with assist) - Balance while standing  and cannot march in place Level 3 (Stands with assist) - Balance while standing  and cannot march in place Level 4 (Walks with assist in room) - Balance while marching in place and cannot step forward and back - Complete Level 4 (Walks with assist in room) - Balance while marching in place and cannot step forward and back - Complete  ? Is the above level different from baseline mobility prior to current illness? -- -- -- -- Yes - Recommend PT order  ? ? [place] Name 02/09/22 1900 02/09/22 1231 02/09/22  0855 02/08/22 2050  ?  ? Does patient have an order for bedrest or is patient medically unstable No - Continue assessment -- No - Continue assessment No - Continue assessment   ? What is the highest level of mobility based on the progressive mobility assessment? Level 3 (Stands with assist) - Balance while standing  and cannot march in place Level 3 (Stands with assist) - Balance while standing  and cannot march in place  with stedy Level 3 (Stands with assist) - Balance while standing  and cannot march in place Level 1 (Bedfast) - Unable to balance while sitting on edge of bed   ? Is the above level different from baseline mobility prior to current illness? Yes - Recommend PT order -- -- Yes - Recommend PT order   ? ?  ?  ? ?  ? ? ?DVT prophylaxis:  ?Code Status: Full ?Family Communication:  ?Status is: Inpatient ? ? ? ?Dispo: The patient is from: Home ?             Anticipated d/c is to: Home ?             Anticipated d/c  date is: 2 days ?             Patient currently is not medically stable to d/c. ? ? ? ? ? ?Consultants:  ?Orthopedic surgery ? ? ?Procedures/Significant Events:  ?4/11 LEFT Hip INTRAMEDULLARY (IM) NAIL INTERTROCHANTRIC ? ? ?I have personally reviewed and interpreted all radiology studies and my findings are as above. ? ?VENTILATOR SETTINGS: ? ? ? ?Cultures ? ? ?Antimicrobials: ?Anti-infectives (From admission, onward)  ? ? Start     Ordered Stop  ? 02/09/22 0600  ceFAZolin (ANCEF) IVPB 2g/100 mL premix       ? 02/08/22 1334 02/08/22 1523  ? 02/08/22 2200  ceFAZolin (ANCEF) IVPB 2g/100 mL premix       ? 02/08/22 1854 02/09/22 0609  ? ?  ?  ? ? ?Devices ?  ? ?LINES / TUBES:  ? ? ? ? ?Continuous Infusions: ? methocarbamol (ROBAXIN) IV    ? ? ? ?Objective: ?Vitals:  ? 02/11/22 0801 02/11/22 1101 02/11/22 1351 02/11/22 1936  ?BP: 131/75 130/65 (!) 103/56 107/61  ?Pulse: 78 78 68 65  ?Resp: '17  18 19  '$ ?Temp: 98.2 ?F (36.8 ?C)  98.2 ?F (36.8 ?C) 98.1 ?F (36.7 ?C)  ?TempSrc: Oral  Oral Oral  ?SpO2: 93%   92% 91%  ?Weight:      ?Height:      ? ? ?Intake/Output Summary (Last 24 hours) at 02/11/2022 1957 ?Last data filed at 02/11/2022 1900 ?Gross per 24 hour  ?Intake --  ?Output 2150 ml  ?Net -2150 ml  ? ? ?Halina Andreas

## 2022-02-11 NOTE — Progress Notes (Signed)
Physical Therapy Treatment ?Patient Details ?Name: Jordan [name] ?MRN: [id number] ?DOB: 15-May-1941 ?Today's Date: 02/11/2022 ? ? ?History of Present Illness Pt is an 81 y.o. male admitted 02/08/22 after fall onto L-hip sustaining L intertrochanteric hip fx. S/p L hip IMN 4/10. Of note, recent admission to 12/18/21 with worsening hallucinations, attributed to pt's Parkinson's disease; d/c to CIR (home 01/17/22), then outpatient neuro eval started 4/4. Other PMH includes OSA, DM2, HTN, CHF, CVA, L2 compression fx (s/p L2 kyphoplasty 12/29/21), frequent falls. ? ?  ?PT Comments  ? ? Pt received supine and agreeable to session with slow but steady progress towards goals. Pt continues to require mod-max assist for bed mobility and tranfers. Pt able to initiate LLE toward EOB with use of strap but needs assist to complete. Pt able to come to standing at EOB with RW x3 trials and maintain standing for ~30 seconds each trial. Pt needing cues for upright posture,able to correct but unable to maintain. Pt able to scoot RLE on floor but unable to initiate any steps toward head of bed. Pt with fair tolerance for LE therex. Current plan remains appropriate to address deficits and maximize functional independence and decrease caregiver burden. Pt continues to benefit from skilled PT services to progress toward functional mobility goals.   ?  ?Recommendations for follow up therapy are one component of a multi-disciplinary discharge planning process, led by the attending physician.  Recommendations may be updated based on patient status, additional functional criteria and insurance authorization. ? ?Follow Up Recommendations ? Acute inpatient rehab (3hours/day) ?  ?  ?Assistance Recommended at Discharge Frequent or constant Supervision/Assistance  ?Patient can return home with the following Two people to help with walking and/or transfers;A lot of help with bathing/dressing/bathroom;Assist for transportation;Help with stairs or ramp for  entrance;Assistance with cooking/housework ?  ?Equipment Recommendations ? None recommended by PT  ?  ?Recommendations for Other Services   ? ? ?  ?Precautions / Restrictions Precautions ?Precautions: Fall;Other (comment) ?Precaution Comments: Watch SpO2 (does not wear at home) ?Restrictions ?Weight Bearing Restrictions: Yes ?LLE Weight Bearing: Partial weight bearing ?LLE Partial Weight Bearing Percentage or Pounds: 50  ?  ? ?Mobility ? Bed Mobility ?Overal bed mobility: Needs Assistance ?Bed Mobility: Supine to Sit, Sit to Supine ?  ?  ?Supine to sit: Mod assist, Max assist ?Sit to supine: Max assist ?  ?General bed mobility comments: mod a for LLE management, pt able to assist with use of strap, max a for turnk elevation and to scoot toward EOB, max a for LLE on return to bed ?  ? ?Transfers ?Overall transfer level: Needs assistance ?Equipment used: Rolling walker (2 wheels) ?Transfers: Sit to/from Stand ?Sit to Stand: Mod assist, Min assist, Max assist ?  ?  ?  ?  ?  ?General transfer comment: cues to remain upright in standing, increased assistance needed standing from lower surface, varying degrees of assist throughout ?  ? ?Ambulation/Gait ?  ?  ?  ?  ?  ?  ?  ?General Gait Details: unable ? ? ?Stairs ?  ?  ?  ?  ?  ? ? ?Wheelchair Mobility ?  ? ?Modified Rankin (Stroke Patients Only) ?  ? ? ?  ?Balance Overall balance assessment: Needs assistance ?Sitting-balance support: No upper extremity supported, Feet supported ?Sitting balance-Leahy Scale: Fair ?Sitting balance - Comments: able to reach down towards feet in chair without LOB ?  ?Standing balance support: Reliant on assistive device for balance, Bilateral upper extremity  supported ?Standing balance-Leahy Scale: Poor ?Standing balance comment: reliant on BUE support and external assist ?  ?  ?  ?  ?  ?  ?  ?  ?  ?  ?  ?  ? ?  ?Cognition Arousal/Alertness: Awake/alert ?Behavior During Therapy: [facility] for tasks assessed/performed ?Overall Cognitive Status:  History of cognitive impairments - at baseline ?  ?  ?  ?  ?  ?  ?  ?  ?  ?  ?  ?  ?  ?  ?  ?  ?General Comments: increased time needed to follow single step instruction perseverative with tasks at times requiring frequent redirection ?  ?  ? ?  ?Exercises General Exercises - Lower Extremity ?Ankle Circles/Pumps: AROM, 10 reps ?Quad Sets: AROM, Left, 10 reps ?Long Arc Quad: AROM, Left, 10 reps ?Hip Flexion/Marching: AROM, AAROM, Right, Left, 20 reps ?Heel Raises: AROM, Left, 10 reps ? ?  ?General Comments General comments (skin integrity, edema, etc.): VSS on RA ?  ?  ? ?Pertinent Vitals/Pain Pain Assessment ?Pain Assessment: 0-10 ?Pain Score: 6  ?Pain Location: LLE ?Pain Descriptors / Indicators: Discomfort, Grimacing, Sore ?Pain Intervention(s): Limited activity within patient's tolerance, Monitored during session, Repositioned  ? ? ?Home Living   ?  ?  ?  ?  ?  ?  ?  ?  ?  ?   ?  ?Prior Function    ?  ?  ?   ? ?PT Goals (current goals can now be found in the care plan section) Acute Rehab PT Goals ?PT Goal Formulation: With patient/family ?Time For Goal Achievement: 02/23/22 ? ?  ?Frequency ? ? ? Min 5X/week ? ? ? ?  ?PT Plan Current plan remains appropriate  ? ? ?Co-evaluation   ?  ?  ?  ?  ? ?  ?AM-PAC PT "6 Clicks" Mobility   ?Outcome Measure ? Help needed turning from your back to your side while in a flat bed without using bedrails?: A Lot ?Help needed moving from lying on your back to sitting on the side of a flat bed without using bedrails?: A Lot ?Help needed moving to and from a bed to a chair (including a wheelchair)?: Total ?Help needed standing up from a chair using your arms (e.g., wheelchair or bedside chair)?: Total ?Help needed to walk in hospital room?: Total ?Help needed climbing 3-5 steps with a railing? : Total ?6 Click Score: 8 ? ?  ?End of Session Equipment Utilized During Treatment: Gait belt ?Activity Tolerance: Patient tolerated treatment well ?Patient left: with call bell/phone within  reach;in bed;with bed alarm set;with family/visitor present ?Nurse Communication: Mobility status ?PT Visit Diagnosis: Other abnormalities of gait and mobility (R26.89);Muscle weakness (generalized) (M62.81);Pain;Repeated falls (R29.6) ?Pain - Right/Left: Left ?Pain - part of body: Hip ?  ? ? ?Time: 6195-0932 ?PT Time Calculation (min) (ACUTE ONLY): 37 min ? ?Charges:  $Gait Training: 8-22 mins ?$Therapeutic Exercise: 8-22 mins          ?          ? ?Audry Riles. PTA ?Acute Rehabilitation Services ?Office: [phone] ? ? ? ?Betsey Holiday Lacie Landry ?02/11/2022, 2:34 PM ? ?

## 2022-02-11 NOTE — Progress Notes (Signed)
Patient placed himself on home CPAP for the night.  

## 2022-02-12 DIAGNOSIS — S72142A Displaced intertrochanteric fracture of left femur, initial encounter for closed fracture: Secondary | ICD-10-CM | POA: Diagnosis not present

## 2022-02-12 DIAGNOSIS — I5032 Chronic diastolic (congestive) heart failure: Secondary | ICD-10-CM | POA: Diagnosis not present

## 2022-02-12 DIAGNOSIS — E1165 Type 2 diabetes mellitus with hyperglycemia: Secondary | ICD-10-CM | POA: Diagnosis not present

## 2022-02-12 DIAGNOSIS — I2510 Atherosclerotic heart disease of native coronary artery without angina pectoris: Secondary | ICD-10-CM | POA: Diagnosis not present

## 2022-02-12 LAB — GLUCOSE, CAPILLARY
Glucose-Capillary: 174 mg/dL — ABNORMAL HIGH (ref 70–99)
Glucose-Capillary: 152 mg/dL — ABNORMAL HIGH (ref 70–99)
Glucose-Capillary: 212 mg/dL — ABNORMAL HIGH (ref 70–99)
Glucose-Capillary: 215 mg/dL — ABNORMAL HIGH (ref 70–99)
Glucose-Capillary: 201 mg/dL — ABNORMAL HIGH (ref 70–99)

## 2022-02-12 LAB — CBC WITH DIFFERENTIAL/PLATELET
Abs Immature Granulocytes: 0.02 10*3/uL (ref 0.00–0.07)
Basophils Absolute: 0.1 10*3/uL (ref 0.0–0.1)
Basophils Relative: 1 %
Eosinophils Absolute: 0.8 10*3/uL — ABNORMAL HIGH (ref 0.0–0.5)
Eosinophils Relative: 8 %
HCT: 35.7 % — ABNORMAL LOW (ref 39.0–52.0)
Hemoglobin: 11.7 g/dL — ABNORMAL LOW (ref 13.0–17.0)
Immature Granulocytes: 0 %
Lymphocytes Relative: 26 %
Lymphs Abs: 2.6 10*3/uL (ref 0.7–4.0)
MCH: 29.5 pg (ref 26.0–34.0)
MCHC: 32.8 g/dL (ref 30.0–36.0)
MCV: 90.2 fL (ref 80.0–100.0)
Monocytes Absolute: 1 10*3/uL (ref 0.1–1.0)
Monocytes Relative: 10 %
Neutro Abs: 5.6 10*3/uL (ref 1.7–7.7)
Neutrophils Relative %: 55 %
Platelets: 236 10*3/uL (ref 150–400)
RBC: 3.96 MIL/uL — ABNORMAL LOW (ref 4.22–5.81)
RDW: 13.4 % (ref 11.5–15.5)
WBC: 10.2 10*3/uL (ref 4.0–10.5)
nRBC: 0 % (ref 0.0–0.2)

## 2022-02-12 LAB — COMPREHENSIVE METABOLIC PANEL
ALT: 11 U/L (ref 0–44)
AST: 17 U/L (ref 15–41)
Albumin: 2.8 g/dL — ABNORMAL LOW (ref 3.5–5.0)
Alkaline Phosphatase: 66 U/L (ref 38–126)
Anion gap: 7 (ref 5–15)
BUN: 16 mg/dL (ref 8–23)
CO2: 25 mmol/L (ref 22–32)
Calcium: 8.9 mg/dL (ref 8.9–10.3)
Chloride: 102 mmol/L (ref 98–111)
Creatinine, Ser: 0.8 mg/dL (ref 0.61–1.24)
GFR, Estimated: >60 mL/min (ref >60)
Glucose, Bld: 173 mg/dL — ABNORMAL HIGH (ref 70–99)
Potassium: 4 mmol/L (ref 3.5–5.1)
Sodium: 134 mmol/L — ABNORMAL LOW (ref 135–145)
Total Bilirubin: 0.5 mg/dL (ref 0.3–1.2)
Total Protein: 6 g/dL — ABNORMAL LOW (ref 6.5–8.1)

## 2022-02-12 LAB — PHOSPHORUS: Phosphorus: 4.2 mg/dL (ref 2.5–4.6)

## 2022-02-12 LAB — MAGNESIUM: Magnesium: 1.8 mg/dL (ref 1.7–2.4)

## 2022-02-12 MED ORDER — INSULIN ASPART 100 UNIT/ML IJ SOLN
8.0000 [IU] | Freq: Three times a day (TID) | INTRAMUSCULAR | Status: DC
  Administered 2022-02-12 – 2022-02-18 (×17): 8 [IU] via SUBCUTANEOUS

## 2022-02-12 MED ORDER — ASPIRIN 81 MG PO CHEW: 81.0000 mg | CHEWABLE_TABLET | Freq: Every day | ORAL | 0 refills | Status: DC

## 2022-02-12 MED ORDER — OXYCODONE HCL 5 MG PO TABS: 5.0000 mg | ORAL_TABLET | Freq: Four times a day (QID) | ORAL | 0 refills | Status: DC | PRN

## 2022-02-12 NOTE — Plan of Care (Signed)
  Problem: Activity: Goal: Risk for activity intolerance will decrease Outcome: Progressing   Problem: Coping: Goal: Level of anxiety will decrease Outcome: Progressing   Problem: Pain Managment: Goal: General experience of comfort will improve Outcome: Progressing   Problem: Safety: Goal: Ability to remain free from injury will improve Outcome: Progressing   Problem: Skin Integrity: Goal: Risk for impaired skin integrity will decrease Outcome: Progressing   

## 2022-02-12 NOTE — Progress Notes (Signed)
Occupational Therapy Treatment ?Patient Details ?Name: Jordan [name] ?MRN: [id number] ?DOB: 1940-11-27 ?Today's Date: 02/12/2022 ? ? ?History of present illness Pt is an 81 y.o. male admitted 02/08/22 after fall onto L-hip sustaining L intertrochanteric hip fx. S/p L hip IMN 4/10. Of note, recent admission to 12/18/21 with worsening hallucinations, attributed to pt's Parkinson's disease; d/c to CIR (home 01/17/22), then outpatient neuro eval started 4/4. Other PMH includes OSA, DM2, HTN, CHF, CVA, L2 compression fx (s/p L2 kyphoplasty 12/29/21), frequent falls. ?  ?OT comments ? Patient received in recliner and asking to return to bed. Patient was max assist +2 to assist with sit to stand into stedy from recliner and mod assist +2 to stand in stedy from pads. Patient required increased time and max assist to get to supine due to pain at LLE.  Acute OT to continue to follow.   ? ?Recommendations for follow up therapy are one component of a multi-disciplinary discharge planning process, led by the attending physician.  Recommendations may be updated based on patient status, additional functional criteria and insurance authorization. ?   ?Follow Up Recommendations ? Acute inpatient rehab (3hours/day)  ?  ?Assistance Recommended at Discharge Intermittent Supervision/Assistance  ?Patient can return home with the following ? A lot of help with walking and/or transfers;A lot of help with bathing/dressing/bathroom;Assistance with cooking/housework;Assist for transportation;Help with stairs or ramp for entrance ?  ?Equipment Recommendations ? Other (comment);None recommended by OT  ?  ?Recommendations for Other Services   ? ?  ?Precautions / Restrictions Precautions ?Precautions: Fall;Other (comment) ?Precaution Comments: Watch SpO2 (does not wear at home) ?Restrictions ?Weight Bearing Restrictions: Yes ?LLE Weight Bearing: Partial weight bearing ?LLE Partial Weight Bearing Percentage or Pounds: 50 ?Other Position/Activity  Restrictions: confirmed with PA and MD 4/14  ? ? ?  ? ?Mobility Bed Mobility ?Overal bed mobility: Needs Assistance ?Bed Mobility: Sit to Supine ?  ?  ?  ?Sit to supine: Max assist ?  ?General bed mobility comments: assistance with LLE ?  ? ?Transfers ?Overall transfer level: Needs assistance ?Equipment used: Ambulation equipment used ?Transfers: Sit to/from Stand, Bed to chair/wheelchair/BSC ?Sit to Stand: Max assist, +2 physical assistance ?  ?  ?  ?  ?  ?General transfer comment: stood from recliner into stedy with max assist +2 and stood from stedy with mod assist +2 ?Transfer via Lift Equipment: Stedy ?  ?Balance Overall balance assessment: Needs assistance ?Sitting-balance support: Feet supported, Single extremity supported ?Sitting balance-Leahy Scale: Fair ?Sitting balance - Comments: reliant on rail for balance ?  ?Standing balance support: Reliant on assistive device for balance, Bilateral upper extremity supported ?Standing balance-Leahy Scale: Poor ?Standing balance comment: reliant on BUE support and external assist ?  ?  ?  ?  ?  ?  ?  ?  ?  ?  ?  ?   ? ?ADL either performed or assessed with clinical judgement  ? ?ADL Overall ADL's : Needs assistance/impaired ?  ?  ?Grooming: Set up;Sitting;Wash/dry hands;Wash/dry face ?Grooming Details (indicate cue type and reason): washes face sitting up in chair ?  ?  ?  ?  ?  ?  ?  ?  ?  ?  ?  ?  ?  ?  ?  ?  ?  ? ?Extremity/Trunk Assessment   ?  ?  ?  ?  ?  ? ?Vision   ?  ?  ?Perception   ?  ?Praxis   ?  ? ?Cognition Arousal/Alertness: Awake/alert ?  Behavior During Therapy: [facility] for tasks assessed/performed ?Overall Cognitive Status: History of cognitive impairments - at baseline ?  ?  ?  ?  ?  ?  ?  ?  ?  ?  ?  ?  ?  ?  ?  ?  ?General Comments: fearful of pain at LLE with movement ?  ?  ?   ?Exercises   ? ?  ?Shoulder Instructions   ? ? ?  ?General Comments    ? ? ?Pertinent Vitals/ Pain       Pain Assessment ?Pain Assessment: Faces ?Faces Pain Scale: Hurts little  more ?Pain Location: LLE with WB and movement ?Pain Descriptors / Indicators: Discomfort, Grimacing, Sore ?Pain Intervention(s): Limited activity within patient's tolerance, Monitored during session, Patient requesting pain meds-RN notified ? ?Home Living   ?  ?  ?  ?  ?  ?  ?  ?  ?  ?  ?  ?  ?  ?  ?  ?  ?  ?  ? ?  ?Prior Functioning/Environment    ?  ?  ?  ?   ? ?Frequency ? Min 2X/week  ? ? ? ? ?  ?Progress Toward Goals ? ?OT Goals(current goals can now be found in the care plan section) ? Progress towards OT goals: Progressing toward goals ? ?Acute Rehab OT Goals ?Patient Stated Goal: get better ?OT Goal Formulation: With patient ?Time For Goal Achievement: 02/24/22 ?Potential to Achieve Goals: Fair ?ADL Goals ?Pt Will Perform Grooming: with min assist;standing ?Pt Will Perform Upper Body Dressing: with min guard assist;sitting ?Pt Will Perform Lower Body Dressing: with adaptive equipment;sit to/from stand;sitting/lateral leans;with min assist;with caregiver independent in assisting ?Pt Will Transfer to Toilet: with min assist;ambulating;bedside commode;squat pivot transfer;stand pivot transfer  ?Plan Discharge plan remains appropriate   ? ?Co-evaluation ? ? ?   ?  ?  ?  ?  ? ?  ?AM-PAC OT "6 Clicks" Daily Activity     ?Outcome Measure ? ? Help from another person eating meals?: None ?Help from another person taking care of personal grooming?: A Little ?Help from another person toileting, which includes using toliet, bedpan, or urinal?: A Lot ?Help from another person bathing (including washing, rinsing, drying)?: A Lot ?Help from another person to put on and taking off regular upper body clothing?: A Little ?Help from another person to put on and taking off regular lower body clothing?: A Lot ?6 Click Score: 16 ? ?  ?End of Session Equipment Utilized During Treatment: Other (comment) Charlaine Dalton) ? ?OT Visit Diagnosis: Unsteadiness on feet (R26.81);Other abnormalities of gait and mobility (R26.89);Muscle weakness  (generalized) (M62.81) ?  ?Activity Tolerance Patient tolerated treatment well ?  ?Patient Left in bed;with call bell/phone within reach;with bed alarm set;with family/visitor present ?  ?Nurse Communication Mobility status;Patient requests pain meds ?  ? ?   ? ?Time: 6861-6837 ?OT Time Calculation (min): 22 min ? ?Charges: OT General Charges ?$OT Visit: 1 Visit ?OT Treatments ?$Therapeutic Activity: 8-22 mins ? ?Lodema Hong, OTA ?Acute Rehabilitation Services  ?Pager [phone] ?Office [phone] ? ? ?[place] ?02/12/2022, 3:14 PM ?

## 2022-02-12 NOTE — Progress Notes (Addendum)
?PROGRESS NOTE ? ? ? ?Jordan [name]  IOX:[id number] DOB: Jan 06, 1941 DOA: 02/08/2022 ?PCP: [name], MD  ? ? ? ?Brief Narrative:  ?81 y.o. WM PMHx HTN, Diastolic CHF, CAD, bradycardia  s/p PPM, type 2 diabetes mellitus, Parkinson's disease, gout, Narcolepsy, and OSA  ? ?Presents after having a fall at home.  Patient lives at home with his wife and normally ambulates with the use of a walker.  He states that his wife told him never to bend over, but bent over last night to pick up batteries that have fallen out of the remote when he lost his balance landing on his left hip.  He thinks he hit his head, but did not report any loss of consciousness.  After fall he was unable to bear weight on the left leg and reports being in severe pain.  He has had a intermittent cough ever since being hospitalized last month with pancreatitis with proximal sigmoid diverticulitis.  After that hospitalization he was sent to rehab prior to him being discharged home. ?  ?Upon admission into the emergency department patient was noted to have stable vital signs.  X-rays of the left hip and pelvis significant for mildly displaced intertrochanteric fracture of the proximal left femur.  Labs significant for WBC 14.9, BUN 24, creatinine 0.93, glucose 175 and all other vital signs maintained.  All other imaging noted chronic changes without any acute fractures or abnormality appreciated.  Patient had been given morphine for pain.  Case was discussed with Dr. Marlou Sa of orthopedics who plans on surgery this afternoon. ? ? ?Subjective: ?4/14 afebrile overnight.  Mild dementia.  Unrealistic expectations (believes by Monday will be able to ambulate on his own with walker: Currently requires maximum to assist) ? ? ? ?Assessment & Plan: ?Covid vaccination; ?  ?Principal Problem: ?  Closed left hip fracture ([place]) secondary to fall at home ?Active Problems: ?  Leukocytosis ?  Chronic diastolic heart failure ([place]) ?  History of CVA (cerebrovascular  accident) ?  Essential hypertension ?  HLD (hyperlipidemia) ?  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  Coronary atherosclerosis ?  Controlled type 2 diabetes mellitus with hyperglycemia, without long-term current use of insulin ([place]) ?  Fall at home, initial encounter ?  Parkinson disease ([place]) ?  Paroxysmal atrial fibrillation (H[facility]) ? ?Closed left hip fracture ([place]) secondary to fall at home ?Acute.  Patient presents after having fallen at home found to have acute mildly displaced intertrochanteric left femur fracture on imaging.  Case was discussed with Dr. Marlou Sa who is undergoing surgery this afternoon. ?-Admit to a surgery telemetry bed ?-Hip fracture order set utilized ?-N.p.o. prior to surgery with orders placed to advance diet on procedure ?-Nonweightbearing on affected leg ?-Hydrocodone/morphine as needed for moderate to severe pain respectively ?-Bowel regimen ?-4/12 wife still holding CIR will reconsider. ?-4/13 patient's request to CIR has been DENIED.  They are appealing very unlikely to be overturned. ?- 4/13 have requested that RN obtain surgeon protocol for PT for left hip in order that patient can at least begin to get some work accomplished.  Ambulate daily with help ?-4/14 surgery PT protocol at bedside.  Patient has worked out today and it is sore. ? ?Leukocytosis ?Acute.  WBC elevated 14.9.    Chest x-ray did not note any acute abnormalities. Suspect secondary to acute fracture. ?-Check urinalysis ?-4/13 resolved ?  ?Chronic diastolic heart failure ([place]) ?Patient with a diastolic CHF last EF noted to be 60-65% with indeterminate diastolic dysfunction on  01/06/2022.  No signs of fluid overload noted on physical exam. ?-Strict in and out ?- Daily weight ?-Continue furosemide 20 mg daily ?  ?Essential hypertension ?On admission blood pressure appears relatively controlled.  Home medication regimen includes metoprolol 25 mg twice daily and furosemide 20 mg daily. ?-Continue home regimen as tolerated ?  ?Paroxysmal  atrial fibrillation (HCC) ?-Patient was noted during last hospitalization and being in atrial fibrillation for 4 hours, but thought to be in the setting of infection.  No other events were reported.  He was not started on anticoagulation at that time. ?-Currently NSR ?  ?Parkinson disease ([place]) ?During prior hospitalization patient had noted to have hallucinations. ?-Delirium precautions ?-Continue Sinemet, amantadine, and Seroquel   ?  ?DM type II controlled with hyperglycemia, without long-term current use of insulin ([place]) ?Patient well controlled.  Last hemoglobin A1c 6.1 on 3/2.  On admission glucose 175.  Home medication regimen includes metformin 1000 mg daily. ?-4/11 moderate SSI ?- 4/13 increase Levemir 12 units daily ?-4/14 NovoLog 8 units qac ?-Hold metformin ? ?History of CVA (cerebrovascular accident) ?-Continue Plavix and statin ?  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  ?HLD (hyperlipidemia) ?Home regimen includes atorvastatin 20 mg daily, and Lovaza 1000 mg daily. ?-Continue atorvastatin and Lovaza ? ?Obese (BMI 33.1 kg/m?) ?  ? ? ?  ?Mobility Assessment (last 72 hours)   ? ? Mobility Assessment   ? ? [place] Name 02/12/22 1500 02/12/22 1300 02/11/22 1956 02/11/22 1400 02/11/22 0900  ? Does patient have an order for bedrest or is patient medically unstable -- -- No - Continue assessment -- No - Continue assessment  ? What is the highest level of mobility based on the progressive mobility assessment? Level 3 (Stands with assist) - Balance while standing  and cannot march in place Level 3 (Stands with assist) - Balance while standing  and cannot march in place Level 3 (Stands with assist) - Balance while standing  and cannot march in place Level 3 (Stands with assist) - Balance while standing  and cannot march in place Level 3 (Stands with assist) - Balance while standing  and cannot march in place  ? Is the above level different from baseline mobility prior to current illness? -- -- Yes -  Recommend PT order -- --  ? ? [place] Name 02/10/22 1308 02/10/22 1100 02/10/22 1048 02/09/22 1900  ?  ? Does patient have an order for bedrest or is patient medically unstable -- -- No - Continue assessment No - Continue assessment   ? What is the highest level of mobility based on the progressive mobility assessment? Level 3 (Stands with assist) - Balance while standing  and cannot march in place Level 4 (Walks with assist in room) - Balance while marching in place and cannot step forward and back - Complete Level 4 (Walks with assist in room) - Balance while marching in place and cannot step forward and back - Complete Level 3 (Stands with assist) - Balance while standing  and cannot march in place   ? Is the above level different from baseline mobility prior to current illness? -- -- Yes - Recommend PT order Yes - Recommend PT order   ? ?  ?  ? ?  ? ? ?DVT prophylaxis:  ?Code Status: Full ?Family Communication:  ?Status is: Inpatient ? ? ? ?Dispo: The patient is from: Home ?             Anticipated d/c is  to: Home ?             Anticipated d/c date is: 2 days ?             Patient currently is not medically stable to d/c. ? ? ? ? ? ?Consultants:  ?Orthopedic surgery ? ? ?Procedures/Significant Events:  ?4/11 LEFT Hip INTRAMEDULLARY (IM) NAIL INTERTROCHANTRIC ? ? ?I have personally reviewed and interpreted all radiology studies and my findings are as above. ? ?VENTILATOR SETTINGS: ? ? ? ?Cultures ? ? ?Antimicrobials: ?Anti-infectives (From admission, onward)  ? ? Start     Ordered Stop  ? 02/09/22 0600  ceFAZolin (ANCEF) IVPB 2g/100 mL premix       ? 02/08/22 1334 02/08/22 1523  ? 02/08/22 2200  ceFAZolin (ANCEF) IVPB 2g/100 mL premix       ? 02/08/22 1854 02/09/22 0609  ? ?  ?  ? ? ?Devices ?  ? ?LINES / TUBES:  ? ? ? ? ?Continuous Infusions: ? methocarbamol (ROBAXIN) IV    ? ? ? ?Objective: ?Vitals:  ? 02/11/22 1351 02/11/22 1936 02/12/22 0748 02/12/22 1509  ?BP: (!) 103/56 107/61 138/88 137/78  ?Pulse: 68 65 72 65   ?Resp: '18 19 18 17  '$ ?Temp: 98.2 ?F (36.8 ?C) 98.1 ?F (36.7 ?C) 97.7 ?F (36.5 ?C) 98.5 ?F (36.9 ?C)  ?TempSrc: Oral Oral Oral Oral  ?SpO2: 92% 91% 97% 96%  ?Weight:      ?Height:      ? ? ?Intake/Output Sum

## 2022-02-12 NOTE — Plan of Care (Signed)

## 2022-02-12 NOTE — Progress Notes (Signed)
?  Subjective: ?Patient stable.  Pain moderate with circumduction of the left leg  ? ?Objective: ?Vital signs in last 24 hours: ?Temp:  [98.1 ?F (36.7 ?C)-98.2 ?F (36.8 ?C)] 98.1 ?F (36.7 ?C) (04/13 1936) ?Pulse Rate:  [65-78] 65 (04/13 1936) ?Resp:  [17-19] 19 (04/13 1936) ?BP: (103-131)/(56-75) 107/61 (04/13 1936) ?SpO2:  [91 %-93 %] 91 % (04/13 1936) ? ?Intake/Output from previous day: ?04/13 0701 - 04/14 0700 ?In: -  ?Out: 7001 [[id number]] ?Intake/Output this shift: ?No intake/output data recorded. ? ?Exam: ? ?Dorsiflexion/Plantar flexion intact ?No cellulitis present ? ?Labs: ?Recent Labs  ?  02/10/22 ?0307 02/11/22 ?0144 02/12/22 ?0230  ?HGB 11.4* 11.2* 11.7*  ? ?Recent Labs  ?  02/11/22 ?0144 02/12/22 ?0230  ?WBC 10.1 10.2  ?RBC 3.72* 3.96*  ?HCT 33.7* 35.7*  ?PLT 200 236  ? ?Recent Labs  ?  02/11/22 ?0144 02/12/22 ?0230  ?NA 135 134*  ?K 3.7 4.0  ?CL 102 102  ?CO2 25 25  ?BUN 20 16  ?CREATININE 0.96 0.80  ?GLUCOSE 283* 173*  ?CALCIUM 8.8* 8.9  ? ?No results for input(s): LABPT, INR in the last 72 hours. ? ?Assessment/Plan: ?Plan at this time is discharged to skilled nursing.  Mobilize touchdown weightbearing for transfers until first postop visit and then we will advance weightbearing.  Overall fracture alignment looks good on postop radiographs.  He is on Plavix and aspirin.  Foley catheter needs to be discontinued. ? ? ?Anderson Malta ?02/12/2022, 7:48 AM  ? ? ?  ?

## 2022-02-12 NOTE — Progress Notes (Signed)
Physical Therapy Treatment ?Patient Details ?Name: Jordan [name] ?MRN: [id number] ?DOB: Sep 02, 1941 ?Today's Date: 02/12/2022 ? ? ?History of Present Illness Pt is an 81 y.o. male admitted 02/08/22 after fall onto L-hip sustaining L intertrochanteric hip fx. S/p L hip IMN 4/10. Of note, recent admission to 12/18/21 with worsening hallucinations, attributed to pt's Parkinson's disease; d/c to CIR (home 01/17/22), then outpatient neuro eval started 4/4. Other PMH includes OSA, DM2, HTN, CHF, CVA, L2 compression fx (s/p L2 kyphoplasty 12/29/21), frequent falls. ? ?  ?PT Comments  ? ? Pt received in supine, agreeable to therapy session with emphasis on supine/seated LE exercises, transfers and pre-gait tasks. Pt needing up to maxA +2 for sit<>stand from elevated bed<>Stedy and recliner<>RW. Pt able to take 2 steps on each LE with RW support and +2 mod/maxA with close chair follow. Pt with good activity tolerance, able to tolerate >40 minutes of activity and reports moderate fatigue at end of session. Nursing staff notified his chair alarm needs another battery to function, pt spouse in room and batteries ordered by unit secretary. Pt continues to benefit from PT services to progress toward functional mobility goals.    ?Recommendations for follow up therapy are one component of a multi-disciplinary discharge planning process, led by the attending physician.  Recommendations may be updated based on patient status, additional functional criteria and insurance authorization. ? ?Follow Up Recommendations ? Acute inpatient rehab (3hours/day) ?  ?  ?Assistance Recommended at Discharge Frequent or constant Supervision/Assistance  ?Patient can return home with the following Two people to help with walking and/or transfers;A lot of help with bathing/dressing/bathroom;Assist for transportation;Help with stairs or ramp for entrance;Assistance with cooking/housework ?  ?Equipment Recommendations ? None recommended by PT  ?   ?Recommendations for Other Services   ? ? ?  ?Precautions / Restrictions Precautions ?Precautions: Fall;Other (comment) ?Precaution Comments: Watch SpO2 (does not wear at home) ?Restrictions ?Weight Bearing Restrictions: Yes ?LLE Weight Bearing: Partial weight bearing ?LLE Partial Weight Bearing Percentage or Pounds: 50 ?Other Position/Activity Restrictions: confirmed with PA and MD 4/14  ?  ? ?Mobility ? Bed Mobility ?Overal bed mobility: Needs Assistance ?Bed Mobility: Supine to Sit, Sit to Supine ?  ?  ?Supine to sit: Max assist, +2 for physical assistance ?  ?  ?General bed mobility comments: mod a for LLE management, max a for trunk elevation and to scoot toward EOB ?  ? ?Transfers ?Overall transfer level: Needs assistance ?Equipment used: Rolling walker (2 wheels) ?Transfers: Sit to/from Stand ?Sit to Stand: Max assist, +2 physical assistance, From elevated surface ?  ?  ?  ?  ?  ?General transfer comment: Multimodal cues for body mechanics to stand from elevated bed<>Stedy; modA from elevated Stedy seat ?Transfer via Lift Equipment: Stedy ? ?Ambulation/Gait ?  ?  ?  ?  ?  ?  ?Pre-gait activities: stepping forward each LE x2 steps with mod/maxA +2 and RW and manual assist to offload weight for stepping and to advance/block his L knee ?  ? ? ?Stairs ?  ?  ?  ?  ?  ? ? ?Wheelchair Mobility ?  ? ?Modified Rankin (Stroke Patients Only) ?  ? ? ?  ?Balance Overall balance assessment: Needs assistance ?Sitting-balance support: Feet supported, Single extremity supported ?Sitting balance-Leahy Scale: Fair ?  ?  ?Standing balance support: Reliant on assistive device for balance, Bilateral upper extremity supported ?Standing balance-Leahy Scale: Poor ?Standing balance comment: reliant on BUE support and external assist ?  ?  ?  ?  ?  ?  ?  ?  ?  ?  ?  ?  ? ?  ?  Cognition Arousal/Alertness: Awake/alert ?Behavior During Therapy: [facility] for tasks assessed/performed ?Overall Cognitive Status: History of cognitive impairments -  at baseline ?  ?  ?  ?  ?  ?  ?  ?  ?  ?  ?  ?  ?  ?  ?  ?  ?General Comments: increased time needed to follow single step instruction perseverative with tasks at times requiring frequent redirection; pt initially tangential about events of previous days and anxious about different meds that were given from his baseline. ?  ?  ? ?  ?Exercises General Exercises - Lower Extremity ?Ankle Circles/Pumps: AROM, 10 reps, Both ?Quad Sets: AROM, Left, 10 reps ?Gluteal Sets: AROM, 10 reps, Supine ?Long Arc Quad: AROM, Left, 10 reps ?Heel Slides: AAROM, Left, 10 reps, Supine ?Hip ABduction/ADduction: AAROM, Left, 10 reps, Supine ?Other Exercises ?Other Exercises: handout given to reinforce supine LE exercises per pt request ? ?  ?General Comments   ?  ?  ? ?Pertinent Vitals/Pain Pain Assessment ?Pain Assessment: Faces ?Faces Pain Scale: Hurts little more ?Pain Location: LLE with WB ?Pain Descriptors / Indicators: Discomfort, Grimacing, Sore ?Pain Intervention(s): Limited activity within patient's tolerance, Monitored during session, Premedicated before session, Repositioned  ? ? ? ?PT Goals (current goals can now be found in the care plan section) Acute Rehab PT Goals ?Patient Stated Goal: hopeful for rehab at [facility] again ?PT Goal Formulation: With patient/family ?Time For Goal Achievement: 02/23/22 ? ?  ?Frequency ? ? ? Min 5X/week ? ? ? ?  ?PT Plan Current plan remains appropriate  ? ? ?   ?AM-PAC PT "6 Clicks" Mobility   ?Outcome Measure ? Help needed turning from your back to your side while in a flat bed without using bedrails?: A Lot ?Help needed moving from lying on your back to sitting on the side of a flat bed without using bedrails?: Total ?Help needed moving to and from a bed to a chair (including a wheelchair)?: Total ?Help needed standing up from a chair using your arms (e.g., wheelchair or bedside chair)?: Total ?Help needed to walk in hospital room?: Total ?Help needed climbing 3-5 steps with a railing? : Total ?6  Click Score: 7 ? ?  ?End of Session Equipment Utilized During Treatment: Gait belt ?Activity Tolerance: Patient tolerated treatment well ?Patient left: with call bell/phone within reach;with family/visitor present;in chair ?Nurse Communication: Mobility status;Other (comment) (pt chair alarm missing 1 battery, needs this in order to activate alarm; [place] ordered batteries to floor, none were present in store rooms; spouse in room) ?PT Visit Diagnosis: Other abnormalities of gait and mobility (R26.89);Muscle weakness (generalized) (M62.81);Pain;Repeated falls (R29.6) ?Pain - Right/Left: Left ?Pain - part of body: Hip ?  ? ? ?Time: 1225-1310 ?PT Time Calculation (min) (ACUTE ONLY): 45 min ? ?Charges:  $Therapeutic Exercise: 8-22 mins ?$Therapeutic Activity: 23-37 mins          ?          ? ?Alaylah Heatherington P., PTA ?Acute Rehabilitation Services ?Secure Chat Preferred 9a-5:30pm ?Office: [phone]  ? ? ?Tacha Manni M Felissa Blouch ?02/12/2022, 1:51 PM ? ?

## 2022-02-12 NOTE — TOC Progression Note (Signed)
Transition of Care (TOC) - Progression Note  ? ? ?Patient Details  ?Name: SERGEI DELO ?MRN: [id number] ?Date of Birth: 1940/12/31 ? ?Transition of Care (TOC) CM/SW Contact  ?Joanne Chars, LCSW ?Phone Number: ?02/12/2022, 11:29 AM ? ?Clinical Narrative:  CSW spoke with pt wife [name] regarding back up plan if CIR appeal is denied.  [name] confirmed that she cannot care for pt at home at this time.  She asked that referral be sent to [facility] inpatient rehab to see if that could possibly be approve.  She also is in agreement that SNF will have to be the option of inpatient rehab is not approved. CSW provided current bed offers.  [name] asks that pt be sent out to all possible facilities, she is willing to drive out of [facility] if it means pt can go to a better rated SNF. ? ?CSW spoke with [facility] and they said they will review referral, which was sent in hub. ?SNF referral also sent out to all possible facilities. ? ? ? ?Expected Discharge Plan: [place] ?Barriers to Discharge: Continued Medical Work up, SNF Pending bed offer ? ?Expected Discharge Plan and Services ?Expected Discharge Plan: [place] ?In-house Referral: Clinical Social Work ?  ?Post Acute Care Choice: [place] ?Living arrangements for the past 2 months: [place] ?Expected Discharge Date: 02/12/22               ?  ?  ?  ?  ?  ?  ?  ?  ?  ?  ? ? ?Social Determinants of Health (SDOH) Interventions ?  ? ?Readmission Risk Interventions ?   ? View : No data to display.  ?  ?  ?  ? ? ?

## 2022-02-13 DIAGNOSIS — E1165 Type 2 diabetes mellitus with hyperglycemia: Secondary | ICD-10-CM | POA: Diagnosis not present

## 2022-02-13 DIAGNOSIS — I2510 Atherosclerotic heart disease of native coronary artery without angina pectoris: Secondary | ICD-10-CM | POA: Diagnosis not present

## 2022-02-13 DIAGNOSIS — I5032 Chronic diastolic (congestive) heart failure: Secondary | ICD-10-CM | POA: Diagnosis not present

## 2022-02-13 DIAGNOSIS — S72142A Displaced intertrochanteric fracture of left femur, initial encounter for closed fracture: Secondary | ICD-10-CM | POA: Diagnosis not present

## 2022-02-13 LAB — PHOSPHORUS: Phosphorus: 4.9 mg/dL — ABNORMAL HIGH (ref 2.5–4.6)

## 2022-02-13 LAB — CBC WITH DIFFERENTIAL/PLATELET
Abs Immature Granulocytes: 0.04 10*3/uL (ref 0.00–0.07)
Basophils Absolute: 0.1 10*3/uL (ref 0.0–0.1)
Basophils Relative: 1 %
Eosinophils Absolute: 0.6 10*3/uL — ABNORMAL HIGH (ref 0.0–0.5)
Eosinophils Relative: 6 %
HCT: 34.7 % — ABNORMAL LOW (ref 39.0–52.0)
Hemoglobin: 11.3 g/dL — ABNORMAL LOW (ref 13.0–17.0)
Immature Granulocytes: 0 %
Lymphocytes Relative: 21 %
Lymphs Abs: 2.3 10*3/uL (ref 0.7–4.0)
MCH: 29.2 pg (ref 26.0–34.0)
MCHC: 32.6 g/dL (ref 30.0–36.0)
MCV: 89.7 fL (ref 80.0–100.0)
Monocytes Absolute: 1 10*3/uL (ref 0.1–1.0)
Monocytes Relative: 9 %
Neutro Abs: 6.8 10*3/uL (ref 1.7–7.7)
Neutrophils Relative %: 63 %
Platelets: 265 10*3/uL (ref 150–400)
RBC: 3.87 MIL/uL — ABNORMAL LOW (ref 4.22–5.81)
RDW: 13.3 % (ref 11.5–15.5)
WBC: 10.8 10*3/uL — ABNORMAL HIGH (ref 4.0–10.5)
nRBC: 0 % (ref 0.0–0.2)

## 2022-02-13 LAB — GLUCOSE, CAPILLARY
Glucose-Capillary: 129 mg/dL — ABNORMAL HIGH (ref 70–99)
Glucose-Capillary: 131 mg/dL — ABNORMAL HIGH (ref 70–99)
Glucose-Capillary: 120 mg/dL — ABNORMAL HIGH (ref 70–99)
Glucose-Capillary: 161 mg/dL — ABNORMAL HIGH (ref 70–99)
Glucose-Capillary: 137 mg/dL — ABNORMAL HIGH (ref 70–99)
Glucose-Capillary: 164 mg/dL — ABNORMAL HIGH (ref 70–99)

## 2022-02-13 LAB — COMPREHENSIVE METABOLIC PANEL
ALT: 6 U/L (ref 0–44)
AST: 18 U/L (ref 15–41)
Albumin: 2.8 g/dL — ABNORMAL LOW (ref 3.5–5.0)
Alkaline Phosphatase: 67 U/L (ref 38–126)
Anion gap: 10 (ref 5–15)
BUN: 16 mg/dL (ref 8–23)
CO2: 24 mmol/L (ref 22–32)
Calcium: 9 mg/dL (ref 8.9–10.3)
Chloride: 105 mmol/L (ref 98–111)
Creatinine, Ser: 0.81 mg/dL (ref 0.61–1.24)
GFR, Estimated: >60 mL/min (ref >60)
Glucose, Bld: 122 mg/dL — ABNORMAL HIGH (ref 70–99)
Potassium: 4 mmol/L (ref 3.5–5.1)
Sodium: 139 mmol/L (ref 135–145)
Total Bilirubin: 1 mg/dL (ref 0.3–1.2)
Total Protein: 5.8 g/dL — ABNORMAL LOW (ref 6.5–8.1)

## 2022-02-13 LAB — MAGNESIUM: Magnesium: 1.8 mg/dL (ref 1.7–2.4)

## 2022-02-13 NOTE — Plan of Care (Signed)

## 2022-02-13 NOTE — Progress Notes (Signed)
Physical Therapy Treatment ?Patient Details ?Name: Jordan [name] ?MRN: [id number] ?DOB: 05/28/1941 ?Today's Date: 02/13/2022 ? ? ?History of Present Illness Pt is an 81 y.o. male admitted 02/08/22 after fall onto L-hip sustaining L intertrochanteric hip fx. S/p L hip IMN 4/10. Of note, recent admission to 12/18/21 with worsening hallucinations, attributed to pt's Parkinson's disease; d/c to CIR (home 01/17/22), then outpatient neuro eval started 4/4. Other PMH includes OSA, DM2, HTN, CHF, CVA, L2 compression fx (s/p L2 kyphoplasty 12/29/21), frequent falls. ? ?  ?PT Comments  ? ? Pt received in supine, agreeable to therapy session although mildly anxious about sequence of events from AM. Pt able to perform sit>stand and pivotal steps from bed>recliner with +2 modA and RW and spouse assisting for caregiver instruction. Pt spouse apprehensive about options for low intensity rehab if high intensity acute inpatient rehab not available and eager to consider other options so given instruction on transfer technique and guarding positions in event that pt has to go home. Pt receptive to all instruction and with good participation. Pt continues to benefit from PT services to progress toward functional mobility goals.    ?Recommendations for follow up therapy are one component of a multi-disciplinary discharge planning process, led by the attending physician.  Recommendations may be updated based on patient status, additional functional criteria and insurance authorization. ? ?Follow Up Recommendations ? Acute inpatient rehab (3hours/day) ?  ?  ?Assistance Recommended at Discharge Frequent or constant Supervision/Assistance  ?Patient can return home with the following Two people to help with walking and/or transfers;A lot of help with bathing/dressing/bathroom;Assist for transportation;Help with stairs or ramp for entrance;Assistance with cooking/housework ?  ?Equipment Recommendations ? None recommended by PT (defer to  post-acute; may need hospital bed/mechanical lift if pt instead transfers home, continue to assess)  ?  ?Recommendations for Other Services   ? ? ?  ?Precautions / Restrictions Precautions ?Precautions: Fall;Other (comment) ?Precaution Comments: Watch SpO2 (does not wear at home) ?Restrictions ?Weight Bearing Restrictions: Yes ?LLE Weight Bearing: Partial weight bearing ?LLE Partial Weight Bearing Percentage or Pounds: 50 ?Other Position/Activity Restrictions: confirmed with PA and MD 4/14  ?  ? ?Mobility ? Bed Mobility ?Overal bed mobility: Needs Assistance ?Bed Mobility: Supine to Sit ?  ?  ?Supine to sit: Mod assist, HOB elevated ?  ?  ?General bed mobility comments: assistance with LLE and trunk/bed pad; cues for body mechanics ?  ? ?Transfers ?Overall transfer level: Needs assistance ?Equipment used: Rolling walker (2 wheels) ?Transfers: Sit to/from Stand, Bed to chair/wheelchair/BSC ?Sit to Stand: +2 physical assistance, Mod assist, From elevated surface ?  ?Step pivot transfers: Mod assist, +2 physical assistance, From elevated surface ?  ?  ?  ?General transfer comment: tactile cues at LLE for advancing LLE and to block L knee while advancing RLE; able to take ~5-6 small steps around to recliner, pt fatigues suddenly needing increased lowering assist and chair pulled a bit closer, pt able to reach back with cues. ?  ? ?Ambulation/Gait ?  ?  ?  ?  ?  ? Distance: ~43f with L knee blocked and manual assist to advance LLE for steps; manual assist for RW proximity/safety ?Pre-gait activities: see transfer section above; step pivot tf bed>chair with spouse assisting as she may take him home; RW and modA +2 ?  ? ? ? ? ?  ?Balance Overall balance assessment: Needs assistance ?Sitting-balance support: Feet supported, Single extremity supported ?Sitting balance-Leahy Scale: Fair ?Sitting balance - Comments: 1-2 UE  support with supervision to min guard for safety ?  ?Standing balance support: Reliant on assistive  device for balance, Bilateral upper extremity supported ?Standing balance-Leahy Scale: Poor ?Standing balance comment: reliant on BUE support and external assist ?  ?  ?  ?  ? ?  ?Cognition Arousal/Alertness: Awake/alert ?Behavior During Therapy: [facility] for tasks assessed/performed ?Overall Cognitive Status: History of cognitive impairments - at baseline ?  ?  ?  ?  ?General Comments: Pt with poor memory of sequence of events from AM when he got up/when meds were given; encouraged him to request pain meds if needed, will need reinforcement due to cognition ?  ?  ? ?  ?Exercises General Exercises - Lower Extremity ?Ankle Circles/Pumps: AROM, 10 reps, Both ?Quad Sets: AROM, 10 reps, Both, Supine ?Long Arc Quad: AROM, Both, 10 reps, Seated ?Heel Slides: AAROM, Left, 10 reps, Supine ?Hip ABduction/ADduction: AAROM, Left, 10 reps, Supine ?Hip Flexion/Marching: AROM, Both, 10 reps, Seated ? ?  ?General Comments General comments (skin integrity, edema, etc.): BP 135/66 (85); SpO2 98% on RA, HR 70's bpm post-transfer; condom cath fell off while pt transferring, NT called to notify, spouse wanting to replace it immediately due to pt frequent urinary incontinence. ?  ?  ? ?Pertinent Vitals/Pain Pain Assessment ?Pain Assessment: Faces ?Faces Pain Scale: Hurts little more ?Pain Location: LLE with WB and movement ?Pain Descriptors / Indicators: Discomfort, Grimacing, Sore ?Pain Intervention(s): Monitored during session, Premedicated before session, Repositioned, Other (comment) (pt defers needing more pain meds prior to OOB)  ? ? ? ?PT Goals (current goals can now be found in the care plan section) Acute Rehab PT Goals ?Patient Stated Goal: to feel better ?PT Goal Formulation: With patient/family ?Time For Goal Achievement: 02/23/22 ?Progress towards PT goals: Progressing toward goals ? ?  ?Frequency ? ? ? Min 5X/week ? ? ? ?  ?PT Plan Current plan remains appropriate  ? ? ?   ?AM-PAC PT "6 Clicks" Mobility   ?Outcome Measure ?  Help needed turning from your back to your side while in a flat bed without using bedrails?: A Lot ?Help needed moving from lying on your back to sitting on the side of a flat bed without using bedrails?: A Lot ?Help needed moving to and from a bed to a chair (including a wheelchair)?: A Lot ?Help needed standing up from a chair using your arms (e.g., wheelchair or bedside chair)?: A Lot ?Help needed to walk in hospital room?: Total ?Help needed climbing 3-5 steps with a railing? : Total ?6 Click Score: 10 ? ?  ?End of Session Equipment Utilized During Treatment: Gait belt ?Activity Tolerance: Patient tolerated treatment well ?Patient left: with call bell/phone within reach;with family/visitor present;in chair;with chair alarm set ?Nurse Communication: Mobility status;Need for lift equipment;Other (comment) (needs new condom cath placed; fell off during transfer) ?PT Visit Diagnosis: Other abnormalities of gait and mobility (R26.89);Muscle weakness (generalized) (M62.81);Pain;Repeated falls (R29.6) ?Pain - Right/Left: Left ?Pain - part of body: Hip ?  ? ? ?Time: 4401-0272 ?PT Time Calculation (min) (ACUTE ONLY): 39 min ? ?Charges:  $Gait Training: 8-22 mins ?$Therapeutic Exercise: 8-22 mins ?$Therapeutic Activity: 8-22 mins          ?          ? ?Nollan Muldrow P., PTA ?Acute Rehabilitation Services ?Secure Chat Preferred 9a-5:30pm ?Office: [phone]  ? ? ?Cace Osorto M Kuron Docken ?02/13/2022, 6:07 PM ? ?

## 2022-02-13 NOTE — Progress Notes (Signed)
?PROGRESS NOTE ? ? ? ?Jordan [name]  FVC:[id number] DOB: 15-Jun-1941 DOA: 02/08/2022 ?PCP: [name], MD  ? ? ? ?Brief Narrative:  ?81 y.o. WM PMHx HTN, Diastolic CHF, CAD, bradycardia  s/p PPM, type 2 diabetes mellitus, Parkinson's disease, gout, Narcolepsy, and OSA  ? ?Presents after having a fall at home.  Patient lives at home with his wife and normally ambulates with the use of a walker.  He states that his wife told him never to bend over, but bent over last night to pick up batteries that have fallen out of the remote when he lost his balance landing on his left hip.  He thinks he hit his head, but did not report any loss of consciousness.  After fall he was unable to bear weight on the left leg and reports being in severe pain.  He has had a intermittent cough ever since being hospitalized last month with pancreatitis with proximal sigmoid diverticulitis.  After that hospitalization he was sent to rehab prior to him being discharged home. ?  ?Upon admission into the emergency department patient was noted to have stable vital signs.  X-rays of the left hip and pelvis significant for mildly displaced intertrochanteric fracture of the proximal left femur.  Labs significant for WBC 14.9, BUN 24, creatinine 0.93, glucose 175 and all other vital signs maintained.  All other imaging noted chronic changes without any acute fractures or abnormality appreciated.  Patient had been given morphine for pain.  Case was discussed with Dr. Marlou Sa of orthopedics who plans on surgery this afternoon. ? ? ?Subjective: ?4/15 A/O x4, patient working PT ? ? ?Assessment & Plan: ?Covid vaccination; ?  ?Principal Problem: ?  Closed left hip fracture ([place]) secondary to fall at home ?Active Problems: ?  Leukocytosis ?  Chronic diastolic heart failure ([place]) ?  History of CVA (cerebrovascular accident) ?  Essential hypertension ?  HLD (hyperlipidemia) ?  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  Coronary atherosclerosis ?  Controlled type 2 diabetes  mellitus with hyperglycemia, without long-term current use of insulin ([place]) ?  Fall at home, initial encounter ?  Parkinson disease ([place]) ?  Paroxysmal atrial fibrillation ([facility]) ? ?Closed left hip fracture ([place]) secondary to fall at home ?Acute.  Patient presents after having fallen at home found to have acute mildly displaced intertrochanteric left femur fracture on imaging.  Case was discussed with Dr. Marlou Sa who is undergoing surgery this afternoon. ?-Admit to a surgery telemetry bed ?-Hip fracture order set utilized ?-N.p.o. prior to surgery with orders placed to advance diet on procedure ?-Nonweightbearing on affected leg ?-Hydrocodone/morphine as needed for moderate to severe pain respectively ?-Bowel regimen ?-4/12 wife still holding CIR will reconsider. ?-4/13 patient's request to CIR has been DENIED.  They are appealing very unlikely to be overturned. ?- 4/13 have requested that RN obtain surgeon protocol for PT for left hip in order that patient can at least begin to get some work accomplished.  Ambulate daily with help ?-4/14 surgery PT protocol at bedside.  Patient has worked out today and it is sore. ? ?Leukocytosis ?Acute.  WBC elevated 14.9.    Chest x-ray did not note any acute abnormalities. Suspect secondary to acute fracture. ?-Check urinalysis ?-4/13 resolved ?  ?Chronic diastolic heart failure ([place]) ?Patient with a diastolic CHF last EF noted to be 60-65% with indeterminate diastolic dysfunction on 04/03/8465.  No signs of fluid overload noted on physical exam. ?-Strict in and out ?- Daily weight ?-Continue furosemide 20 mg  daily ?  ?Essential hypertension ?On admission blood pressure appears relatively controlled.  Home medication regimen includes metoprolol 25 mg twice daily and furosemide 20 mg daily. ?-Continue home regimen as tolerated ?  ?Paroxysmal atrial fibrillation (H[facility]) ?-Patient was noted during last hospitalization and being in atrial fibrillation for 4 hours, but thought to be in the  setting of infection.  No other events were reported.  He was not started on anticoagulation at that time. ?-Currently NSR ?  ?Parkinson disease ([place]) ?During prior hospitalization patient had noted to have hallucinations. ?-Delirium precautions ?-Continue Sinemet, amantadine, and Seroquel   ?  ?DM type II controlled with hyperglycemia, without long-term current use of insulin ([place]) ?Patient well controlled.  Last hemoglobin A1c 6.1 on 3/2.  On admission glucose 175.  Home medication regimen includes metformin 1000 mg daily. ?-4/11 moderate SSI ?- 4/13 increase Levemir 12 units daily ?-4/14 NovoLog 8 units qac ?-Hold metformin ? ?History of CVA (cerebrovascular accident) ?-Continue Plavix and statin ?  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  ?HLD (hyperlipidemia) ?Home regimen includes atorvastatin 20 mg daily, and Lovaza 1000 mg daily. ?-Continue atorvastatin and Lovaza ? ?Obese (BMI 33.1 kg/m?) ?  ? ? ?  ?Mobility Assessment (last 72 hours)   ? ? Mobility Assessment   ? ? [place] Name 02/12/22 2123 02/12/22 1500 02/12/22 1300 02/11/22 1956 02/11/22 1400  ? Does patient have an order for bedrest or is patient medically unstable No - Continue assessment -- -- No - Continue assessment --  ? What is the highest level of mobility based on the progressive mobility assessment? Level 3 (Stands with assist) - Balance while standing  and cannot march in place Level 3 (Stands with assist) - Balance while standing  and cannot march in place Level 3 (Stands with assist) - Balance while standing  and cannot march in place Level 3 (Stands with assist) - Balance while standing  and cannot march in place Level 3 (Stands with assist) - Balance while standing  and cannot march in place  ? Is the above level different from baseline mobility prior to current illness? Yes - Recommend PT order -- -- Yes - Recommend PT order --  ? ? [place] Name 02/11/22 0900  ?  ?  ?  ?  ? Does patient have an order for bedrest or is patient  medically unstable No - Continue assessment      ? What is the highest level of mobility based on the progressive mobility assessment? Level 3 (Stands with assist) - Balance while standing  and cannot march in place      ? ?  ?  ? ?  ? ? ?DVT prophylaxis:  ?Code Status: Full ?Family Communication:  ?Status is: Inpatient ? ? ? ?Dispo: The patient is from: Home ?             Anticipated d/c is to: Home ?             Anticipated d/c date is: 2 days ?             Patient currently is not medically stable to d/c. ? ? ? ? ? ?Consultants:  ?Orthopedic surgery ? ? ?Procedures/Significant Events:  ?4/11 LEFT Hip INTRAMEDULLARY (IM) NAIL INTERTROCHANTRIC ? ? ?I have personally reviewed and interpreted all radiology studies and my findings are as above. ? ?VENTILATOR SETTINGS: ? ? ? ?Cultures ? ? ?Antimicrobials: ?Anti-infectives (From admission, onward)  ? ? Start  Ordered Stop  ? 02/09/22 0600  ceFAZolin (ANCEF) IVPB 2g/100 mL premix       ? 02/08/22 1334 02/08/22 1523  ? 02/08/22 2200  ceFAZolin (ANCEF) IVPB 2g/100 mL premix       ? 02/08/22 1854 02/09/22 0609  ? ?  ?  ? ? ?Devices ?  ? ?LINES / TUBES:  ? ? ? ? ?Continuous Infusions: ? methocarbamol (ROBAXIN) IV    ? ? ? ?Objective: ?Vitals:  ? 02/12/22 2356 02/13/22 0546 02/13/22 0718 02/13/22 1520  ?BP:  135/81 (!) 144/94 115/88  ?Pulse: 80 73 79 69  ?Resp: '16 18 17 16  '$ ?Temp:  97.7 ?F (36.5 ?C) 97.8 ?F (36.6 ?C) 98 ?F (36.7 ?C)  ?TempSrc:  Oral Oral Oral  ?SpO2: 95% 96% 99% 93%  ?Weight:  99.4 kg    ?Height:      ? ? ?Intake/Output Summary (Last 24 hours) at 02/13/2022 1607 ?Last data filed at 02/13/2022 1050 ?Gross per 24 hour  ?Intake 120 ml  ?Output 1100 ml  ?Net -980 ml  ? ? ?Filed Weights  ? 02/08/22 0039 02/09/22 0500 02/13/22 0546  ?Weight: 96.6 kg 101.7 kg 99.4 kg  ? ? ?Examination: ? ?General: No acute respiratory distress ?Eyes: negative scleral hemorrhage, negative anisocoria, negative icterus ?ENT: Negative Runny nose, negative gingival bleeding, ?Neck:   Negative scars, masses, torticollis, lymphadenopathy, JVD ?Lungs: Clear to auscultation bilaterally without wheezes or crackles ?Cardiovascular: Regular rate and rhythm without murmur gallop or rub normal S1 and

## 2022-02-13 NOTE — Plan of Care (Signed)
?  Problem: Activity: Goal: Risk for activity intolerance will decrease Outcome: Progressing   Problem: Nutrition: Goal: Adequate nutrition will be maintained Outcome: Progressing   Problem: Coping: Goal: Level of anxiety will decrease Outcome: Progressing   Problem: Elimination: Goal: Will not experience complications related to bowel motility Outcome: Progressing   Problem: Safety: Goal: Ability to remain free from injury will improve Outcome: Progressing   

## 2022-02-14 DIAGNOSIS — I2510 Atherosclerotic heart disease of native coronary artery without angina pectoris: Secondary | ICD-10-CM | POA: Diagnosis not present

## 2022-02-14 DIAGNOSIS — S72142A Displaced intertrochanteric fracture of left femur, initial encounter for closed fracture: Secondary | ICD-10-CM | POA: Diagnosis not present

## 2022-02-14 DIAGNOSIS — E1165 Type 2 diabetes mellitus with hyperglycemia: Secondary | ICD-10-CM | POA: Diagnosis not present

## 2022-02-14 DIAGNOSIS — I5032 Chronic diastolic (congestive) heart failure: Secondary | ICD-10-CM | POA: Diagnosis not present

## 2022-02-14 LAB — MAGNESIUM: Magnesium: 1.9 mg/dL (ref 1.7–2.4)

## 2022-02-14 LAB — GLUCOSE, CAPILLARY
Glucose-Capillary: 170 mg/dL — ABNORMAL HIGH (ref 70–99)
Glucose-Capillary: 185 mg/dL — ABNORMAL HIGH (ref 70–99)
Glucose-Capillary: 191 mg/dL — ABNORMAL HIGH (ref 70–99)
Glucose-Capillary: 183 mg/dL — ABNORMAL HIGH (ref 70–99)

## 2022-02-14 LAB — COMPREHENSIVE METABOLIC PANEL
ALT: 8 U/L (ref 0–44)
AST: 17 U/L (ref 15–41)
Albumin: 2.8 g/dL — ABNORMAL LOW (ref 3.5–5.0)
Alkaline Phosphatase: 75 U/L (ref 38–126)
Anion gap: 7 (ref 5–15)
BUN: 19 mg/dL (ref 8–23)
CO2: 27 mmol/L (ref 22–32)
Calcium: 9 mg/dL (ref 8.9–10.3)
Chloride: 102 mmol/L (ref 98–111)
Creatinine, Ser: 0.96 mg/dL (ref 0.61–1.24)
GFR, Estimated: >60 mL/min (ref >60)
Glucose, Bld: 147 mg/dL — ABNORMAL HIGH (ref 70–99)
Potassium: 4 mmol/L (ref 3.5–5.1)
Sodium: 136 mmol/L (ref 135–145)
Total Bilirubin: 0.7 mg/dL (ref 0.3–1.2)
Total Protein: 6.2 g/dL — ABNORMAL LOW (ref 6.5–8.1)

## 2022-02-14 LAB — CBC WITH DIFFERENTIAL/PLATELET
Abs Immature Granulocytes: 0.04 10*3/uL (ref 0.00–0.07)
Basophils Absolute: 0.1 10*3/uL (ref 0.0–0.1)
Basophils Relative: 1 %
Eosinophils Absolute: 0.6 10*3/uL — ABNORMAL HIGH (ref 0.0–0.5)
Eosinophils Relative: 5 %
HCT: 35.3 % — ABNORMAL LOW (ref 39.0–52.0)
Hemoglobin: 11.9 g/dL — ABNORMAL LOW (ref 13.0–17.0)
Immature Granulocytes: 0 %
Lymphocytes Relative: 24 %
Lymphs Abs: 2.6 10*3/uL (ref 0.7–4.0)
MCH: 30.3 pg (ref 26.0–34.0)
MCHC: 33.7 g/dL (ref 30.0–36.0)
MCV: 89.8 fL (ref 80.0–100.0)
Monocytes Absolute: 1 10*3/uL (ref 0.1–1.0)
Monocytes Relative: 9 %
Neutro Abs: 6.6 10*3/uL (ref 1.7–7.7)
Neutrophils Relative %: 61 %
Platelets: 276 10*3/uL (ref 150–400)
RBC: 3.93 MIL/uL — ABNORMAL LOW (ref 4.22–5.81)
RDW: 13.3 % (ref 11.5–15.5)
WBC: 10.9 10*3/uL — ABNORMAL HIGH (ref 4.0–10.5)
nRBC: 0 % (ref 0.0–0.2)

## 2022-02-14 LAB — PHOSPHORUS: Phosphorus: 4.3 mg/dL (ref 2.5–4.6)

## 2022-02-14 NOTE — Progress Notes (Signed)
Patient currently on home CPAP. No assistance needed from RT at this time. RT will monitor as needed. ?

## 2022-02-14 NOTE — Progress Notes (Signed)
?PROGRESS NOTE ? ? ? ?Jordan [name]  KKX:[id number] DOB: 04-02-1941 DOA: 02/08/2022 ?PCP: [name], MD  ? ? ? ?Brief Narrative:  ?81 y.o. WM PMHx HTN, Diastolic CHF, CAD, bradycardia  s/p PPM, type 2 diabetes mellitus, Parkinson's disease, gout, Narcolepsy, and OSA  ? ?Presents after having a fall at home.  Patient lives at home with his wife and normally ambulates with the use of a walker.  He states that his wife told him never to bend over, but bent over last night to pick up batteries that have fallen out of the remote when he lost his balance landing on his left hip.  He thinks he hit his head, but did not report any loss of consciousness.  After fall he was unable to bear weight on the left leg and reports being in severe pain.  He has had a intermittent cough ever since being hospitalized last month with pancreatitis with proximal sigmoid diverticulitis.  After that hospitalization he was sent to rehab prior to him being discharged home. ?  ?Upon admission into the emergency department patient was noted to have stable vital signs.  X-rays of the left hip and pelvis significant for mildly displaced intertrochanteric fracture of the proximal left femur.  Labs significant for WBC 14.9, BUN 24, creatinine 0.93, glucose 175 and all other vital signs maintained.  All other imaging noted chronic changes without any acute fractures or abnormality appreciated.  Patient had been given morphine for pain.  Case was discussed with Dr. Marlou Sa of orthopedics who plans on surgery this afternoon. ? ? ?Subjective: ?4/16 A/O x4,  ? ? ?Assessment & Plan: ?Covid vaccination; ?  ?Principal Problem: ?  Closed left hip fracture ([place]) secondary to fall at home ?Active Problems: ?  Leukocytosis ?  Chronic diastolic heart failure ([place]) ?  History of CVA (cerebrovascular accident) ?  Essential hypertension ?  HLD (hyperlipidemia) ?  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  Coronary atherosclerosis ?  Controlled type 2 diabetes mellitus with  hyperglycemia, without long-term current use of insulin ([place]) ?  Fall at home, initial encounter ?  Parkinson disease ([place]) ?  Paroxysmal atrial fibrillation (H[facility]) ? ?Closed left hip fracture ([place]) secondary to fall at home ?Acute.  Patient presents after having fallen at home found to have acute mildly displaced intertrochanteric left femur fracture on imaging.  Case was discussed with Dr. Marlou Sa who is undergoing surgery this afternoon. ?-Admit to a surgery telemetry bed ?-Hip fracture order set utilized ?-N.p.o. prior to surgery with orders placed to advance diet on procedure ?-Nonweightbearing on affected leg ?-Hydrocodone/morphine as needed for moderate to severe pain respectively ?-Bowel regimen ?-4/12 wife still holding CIR will reconsider. ?-4/13 patient's request to CIR has been DENIED.  They are appealing very unlikely to be overturned. ?- 4/13 have requested that RN obtain surgeon protocol for PT for left hip in order that patient can at least begin to get some work accomplished.  Ambulate daily with help ?-4/14 surgery PT protocol at bedside.  Patient has worked out today and it is sore. ? ?Leukocytosis ?Acute.  WBC elevated 14.9.    Chest x-ray did not note any acute abnormalities. Suspect secondary to acute fracture. ?-Check urinalysis ?-4/13 resolved ?  ?Chronic diastolic heart failure ([place]) ?Patient with a diastolic CHF last EF noted to be 60-65% with indeterminate diastolic dysfunction on 11/06/9676.  No signs of fluid overload noted on physical exam. ?-Strict in and out ?- Daily weight ?-Continue furosemide 20 mg daily ?  ?  Essential hypertension ?On admission blood pressure appears relatively controlled.  Home medication regimen includes metoprolol 25 mg twice daily and furosemide 20 mg daily. ?-Continue home regimen as tolerated ?  ?Paroxysmal atrial fibrillation (HCC) ?-Patient was noted during last hospitalization and being in atrial fibrillation for 4 hours, but thought to be in the setting of  infection.  No other events were reported.  He was not started on anticoagulation at that time. ?-Currently NSR ?  ?Parkinson disease ([place]) ?During prior hospitalization patient had noted to have hallucinations. ?-Delirium precautions ?-Continue Sinemet, amantadine, and Seroquel   ?  ?DM type II controlled with hyperglycemia, without long-term current use of insulin ([place]) ?Patient well controlled.  Last hemoglobin A1c 6.1 on 3/2.  On admission glucose 175.  Home medication regimen includes metformin 1000 mg daily. ?-4/11 moderate SSI ?- 4/13 increase Levemir 12 units daily ?-4/14 NovoLog 8 units qac ?-Hold metformin ? ?History of CVA (cerebrovascular accident) ?-Continue Plavix and statin ?  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  ?HLD (hyperlipidemia) ?Home regimen includes atorvastatin 20 mg daily, and Lovaza 1000 mg daily. ?-Continue atorvastatin and Lovaza ? ?Obese (BMI 33.1 kg/m?) ?  ? ? ?  ?Mobility Assessment (last 72 hours)   ? ? Mobility Assessment   ? ? [place] Name 02/14/22 0900 02/13/22 1742 02/12/22 2123 02/12/22 1500 02/12/22 1300  ? Does patient have an order for bedrest or is patient medically unstable No - Continue assessment -- No - Continue assessment -- --  ? What is the highest level of mobility based on the progressive mobility assessment? Level 3 (Stands with assist) - Balance while standing  and cannot march in place Level 3 (Stands with assist) - Balance while standing  and cannot march in place Level 3 (Stands with assist) - Balance while standing  and cannot march in place Level 3 (Stands with assist) - Balance while standing  and cannot march in place Level 3 (Stands with assist) - Balance while standing  and cannot march in place  ? Is the above level different from baseline mobility prior to current illness? Yes - Recommend PT order -- Yes - Recommend PT order -- --  ? ? [place] Name 02/11/22 1956  ?  ?  ?  ?  ? Does patient have an order for bedrest or is patient medically unstable  No - Continue assessment      ? What is the highest level of mobility based on the progressive mobility assessment? Level 3 (Stands with assist) - Balance while standing  and cannot march in place      ? Is the above level different from baseline mobility prior to current illness? Yes - Recommend PT order      ? ?  ?  ? ?  ? ? ?DVT prophylaxis:  ?Code Status: Full ?Family Communication:  ?Status is: Inpatient ? ? ? ?Dispo: The patient is from: Home ?             Anticipated d/c is to: Home ?             Anticipated d/c date is: 2 days ?             Patient currently is not medically stable to d/c. ? ? ? ? ? ?Consultants:  ?Orthopedic surgery ? ? ?Procedures/Significant Events:  ?4/11 LEFT Hip INTRAMEDULLARY (IM) NAIL INTERTROCHANTRIC ? ? ?I have personally reviewed and interpreted all radiology studies and my findings are as above. ? ?VENTILATOR  SETTINGS: ? ? ? ?Cultures ? ? ?Antimicrobials: ?Anti-infectives (From admission, onward)  ? ? Start     Ordered Stop  ? 02/09/22 0600  ceFAZolin (ANCEF) IVPB 2g/100 mL premix       ? 02/08/22 1334 02/08/22 1523  ? 02/08/22 2200  ceFAZolin (ANCEF) IVPB 2g/100 mL premix       ? 02/08/22 1854 02/09/22 0609  ? ?  ?  ? ? ?Devices ?  ? ?LINES / TUBES:  ? ? ? ? ?Continuous Infusions: ? methocarbamol (ROBAXIN) IV    ? ? ? ?Objective: ?Vitals:  ? 02/13/22 2241 02/14/22 0541 02/14/22 0708 02/14/22 1445  ?BP:  105/72 115/63 132/67  ?Pulse: 70 68 60 64  ?Resp: '16 15  16  '$ ?Temp:  98.3 ?F (36.8 ?C) 98.2 ?F (36.8 ?C)   ?TempSrc:  Oral Oral   ?SpO2: 94% 93% 95% 97%  ?Weight:      ?Height:      ? ?No intake or output data in the 24 hours ending 02/14/22 1947 ? ?Filed Weights  ? 02/08/22 0039 02/09/22 0500 02/13/22 0546  ?Weight: 96.6 kg 101.7 kg 99.4 kg  ? ? ?Examination: ? ?General: No acute respiratory distress ?Eyes: negative scleral hemorrhage, negative anisocoria, negative icterus ?ENT: Negative Runny nose, negative gingival bleeding, ?Neck:  Negative scars, masses, torticollis,  lymphadenopathy, JVD ?Lungs: Clear to auscultation bilaterally without wheezes or crackles ?Cardiovascular: Regular rate and rhythm without murmur gallop or rub normal S1 and S2 ?Abdomen: negative abdominal pain, n

## 2022-02-15 DIAGNOSIS — S72142A Displaced intertrochanteric fracture of left femur, initial encounter for closed fracture: Secondary | ICD-10-CM | POA: Diagnosis not present

## 2022-02-15 DIAGNOSIS — I2510 Atherosclerotic heart disease of native coronary artery without angina pectoris: Secondary | ICD-10-CM | POA: Diagnosis not present

## 2022-02-15 DIAGNOSIS — E1165 Type 2 diabetes mellitus with hyperglycemia: Secondary | ICD-10-CM | POA: Diagnosis not present

## 2022-02-15 DIAGNOSIS — I5032 Chronic diastolic (congestive) heart failure: Secondary | ICD-10-CM | POA: Diagnosis not present

## 2022-02-15 LAB — CBC WITH DIFFERENTIAL/PLATELET
Abs Immature Granulocytes: 0.04 10*3/uL (ref 0.00–0.07)
Basophils Absolute: 0.1 10*3/uL (ref 0.0–0.1)
Basophils Relative: 1 %
Eosinophils Absolute: 0.6 10*3/uL — ABNORMAL HIGH (ref 0.0–0.5)
Eosinophils Relative: 6 %
HCT: 36.8 % — ABNORMAL LOW (ref 39.0–52.0)
Hemoglobin: 11.9 g/dL — ABNORMAL LOW (ref 13.0–17.0)
Immature Granulocytes: 0 %
Lymphocytes Relative: 27 %
Lymphs Abs: 2.7 10*3/uL (ref 0.7–4.0)
MCH: 29 pg (ref 26.0–34.0)
MCHC: 32.3 g/dL (ref 30.0–36.0)
MCV: 89.8 fL (ref 80.0–100.0)
Monocytes Absolute: 1 10*3/uL (ref 0.1–1.0)
Monocytes Relative: 10 %
Neutro Abs: 5.5 10*3/uL (ref 1.7–7.7)
Neutrophils Relative %: 56 %
Platelets: 302 10*3/uL (ref 150–400)
RBC: 4.1 MIL/uL — ABNORMAL LOW (ref 4.22–5.81)
RDW: 13.2 % (ref 11.5–15.5)
WBC: 9.8 10*3/uL (ref 4.0–10.5)
nRBC: 0 % (ref 0.0–0.2)

## 2022-02-15 LAB — GLUCOSE, CAPILLARY
Glucose-Capillary: 126 mg/dL — ABNORMAL HIGH (ref 70–99)
Glucose-Capillary: 132 mg/dL — ABNORMAL HIGH (ref 70–99)
Glucose-Capillary: 141 mg/dL — ABNORMAL HIGH (ref 70–99)
Glucose-Capillary: 150 mg/dL — ABNORMAL HIGH (ref 70–99)
Glucose-Capillary: 172 mg/dL — ABNORMAL HIGH (ref 70–99)
Glucose-Capillary: 173 mg/dL — ABNORMAL HIGH (ref 70–99)
Glucose-Capillary: 208 mg/dL — ABNORMAL HIGH (ref 70–99)

## 2022-02-15 LAB — PHOSPHORUS: Phosphorus: 4.3 mg/dL (ref 2.5–4.6)

## 2022-02-15 LAB — MAGNESIUM: Magnesium: 2 mg/dL (ref 1.7–2.4)

## 2022-02-15 NOTE — Progress Notes (Signed)
Physical Therapy Treatment ?Patient Details ?Name: Jordan [name] ?MRN: [id number] ?DOB: 06/08/1941 ?Today's Date: 02/15/2022 ? ? ?History of Present Illness Pt is an 81 y.o. male admitted 02/08/22 after fall onto L-hip sustaining L intertrochanteric hip fx. S/p L hip IMN 4/10. Of note, recent admission to 12/18/21 with worsening hallucinations, attributed to pt's Parkinson's disease; d/c to CIR (home 01/17/22), then outpatient neuro eval started 4/4. Other PMH includes OSA, DM2, HTN, CHF, CVA, L2 compression fx (s/p L2 kyphoplasty 12/29/21), frequent falls. ? ?  ?PT Comments  ? ? Pt progressing towards physical therapy goals. Session limited by urinary incontinence and several gown changes/linen change/ sock change. Noted bed was malfunctioning, and therapist tagged bed as broken. Secretary ordered new bed and therapist switched out old bed for new bed at end of session. Will continue to follow and progress as able per POC.  ?  ?Recommendations for follow up therapy are one component of a multi-disciplinary discharge planning process, led by the attending physician.  Recommendations may be updated based on patient status, additional functional criteria and insurance authorization. ? ?Follow Up Recommendations ? Acute inpatient rehab (3hours/day) ?  ?  ?Assistance Recommended at Discharge Frequent or constant Supervision/Assistance  ?Patient can return home with the following Two people to help with walking and/or transfers;A lot of help with bathing/dressing/bathroom;Assist for transportation;Help with stairs or ramp for entrance;Assistance with cooking/housework ?  ?Equipment Recommendations ? None recommended by PT (defer to post-acute; may need hospital bed/mechanical lift if pt instead transfers home, continue to assess)  ?  ?Recommendations for Other Services   ? ? ?  ?Precautions / Restrictions Precautions ?Precautions: Fall;Other (comment) ?Restrictions ?Weight Bearing Restrictions: Yes ?LLE Weight Bearing:  Partial weight bearing ?LLE Partial Weight Bearing Percentage or Pounds: 50  ?  ? ?Mobility ? Bed Mobility ?Overal bed mobility: Needs Assistance ?Bed Mobility: Supine to Sit ?  ?  ?Supine to sit: Mod assist, Max assist, HOB elevated ?  ?  ?General bed mobility comments: Mod-max assist for transition to EOB. Heavy assist with bed pad for scooting around fully to get feet on the ground. ?  ? ?Transfers ?Overall transfer level: Needs assistance ?Equipment used: Rolling walker (2 wheels) ?Transfers: Sit to/from Stand, Bed to chair/wheelchair/BSC ?Sit to Stand: +2 physical assistance, Mod assist, From elevated surface ?  ?Step pivot transfers: Mod assist, +2 physical assistance, From elevated surface ?  ?  ?  ?General transfer comment: Slow and effortful to take pivotal steps around from bed to chair which was positioned on the right side. ?  ? ?Ambulation/Gait ?  ?  ?  ?  ?  ?  ?  ?General Gait Details: Ambulation deferred as pt had urinary incontinence upon initial stand and was too fatigued to attempt after clean up and transfer to chair. ? ? ?Stairs ?  ?  ?  ?  ?  ? ? ?Wheelchair Mobility ?  ? ?Modified Rankin (Stroke Patients Only) ?  ? ? ?  ?Balance Overall balance assessment: Needs assistance ?Sitting-balance support: Feet supported, Single extremity supported ?Sitting balance-Leahy Scale: Fair ?Sitting balance - Comments: 1-2 UE support with supervision to min guard for safety ?  ?Standing balance support: Reliant on assistive device for balance, Bilateral upper extremity supported ?Standing balance-Leahy Scale: Poor ?Standing balance comment: reliant on BUE support and external assist ?  ?  ?  ?  ?  ?  ?  ?  ?  ?  ?  ?  ? ?  ?Cognition  Arousal/Alertness: Awake/alert ?Behavior During Therapy: [facility] for tasks assessed/performed ?Overall Cognitive Status: History of cognitive impairments - at baseline ?  ?  ?  ?  ?  ?  ?  ?  ?  ?  ?  ?  ?  ?  ?  ?  ?  ?  ?  ? ?  ?Exercises   ? ?  ?General Comments   ?  ?   ? ?Pertinent Vitals/Pain Pain Assessment ?Pain Assessment: Faces ?Faces Pain Scale: Hurts little more ?Pain Location: LLE with WB and movement ?Pain Descriptors / Indicators: Sore, Operative site guarding, Grimacing ?Pain Intervention(s): Limited activity within patient's tolerance, Monitored during session, Repositioned  ? ? ?Home Living   ?  ?  ?  ?  ?  ?  ?  ?  ?  ?   ?  ?Prior Function    ?  ?  ?   ? ?PT Goals (current goals can now be found in the care plan section) Acute Rehab PT Goals ?Patient Stated Goal: to feel better ?PT Goal Formulation: With patient/family ?Time For Goal Achievement: 02/23/22 ?Potential to Achieve Goals: Good ?Progress towards PT goals: Progressing toward goals ? ?  ?Frequency ? ? ? Min 3X/week ? ? ? ?  ?PT Plan Frequency needs to be updated  ? ? ?Co-evaluation   ?  ?  ?  ?  ? ?  ?AM-PAC PT "6 Clicks" Mobility   ?Outcome Measure ? Help needed turning from your back to your side while in a flat bed without using bedrails?: A Lot ?Help needed moving from lying on your back to sitting on the side of a flat bed without using bedrails?: A Lot ?Help needed moving to and from a bed to a chair (including a wheelchair)?: A Lot ?Help needed standing up from a chair using your arms (e.g., wheelchair or bedside chair)?: Total ?Help needed to walk in hospital room?: Total ?Help needed climbing 3-5 steps with a railing? : Total ?6 Click Score: 9 ? ?  ?End of Session Equipment Utilized During Treatment: Gait belt ?Activity Tolerance: Patient tolerated treatment well ?Patient left: with call bell/phone within reach;with family/visitor present;in chair;with chair alarm set ?Nurse Communication: Mobility status;Other (comment) (Condom cath off when PT arrived, bed broken, new bed ordered) ?PT Visit Diagnosis: Other abnormalities of gait and mobility (R26.89);Muscle weakness (generalized) (M62.81);Pain;Repeated falls (R29.6) ?Pain - Right/Left: Left ?Pain - part of body: Hip ?  ? ? ?Time: 3474-2595 ?PT  Time Calculation (min) (ACUTE ONLY): 45 min ? ?Charges:  $Gait Training: 23-37 mins ?$Therapeutic Activity: 8-22 mins          ?          ? ?Rolinda Roan, PT, DPT ?Acute Rehabilitation Services ?Secure Chat Preferred ?Office: [phone]  ? ? ?Thelma Comp ?02/15/2022, 3:51 PM ? ?

## 2022-02-15 NOTE — Progress Notes (Signed)
Inpatient Rehab Admissions Coordinator:  ? ?Fast/expedited appeal started this AM.  Updated wife by phone.  Will continue to follow.   ? ?Shann Medal, PT, DPT ?Admissions Coordinator ?[phone] ?02/15/22  ?3:42 PM ? ?

## 2022-02-15 NOTE — TOC Progression Note (Addendum)
Transition of Care (TOC) - Progression Note  ? ? ?Patient Details  ?Name: Jordan [name] ?MRN: [id number] ?Date of Birth: April 24, 1941 ? ?Transition of Care (TOC) CM/SW Contact  ?Joanne Chars, LCSW ?Phone Number: ?02/15/2022, 11:26 AM ? ?Clinical Narrative:   CSW spoke with pt wife [name].  Discussed CIR appeal decision is still pending.  She asked about [facility], discussed that they are still reviewing.  Kelly received call from [facility] over the weekend, they mentioned a "family appeal" that she wants to pursue if the current appeal fails.  CSW again gently informed her that success with CIR is not likely and that we need to keep also moving forward with the SNF back up plan.  [name] visited [place] over the weekend and was very unimpressed.  CSW went through the bed offers, discussed the highest rating offers she has: [facility] and [facility].  [name] agreed to review these.  She asked about [facility], [facility] informed her that they had declined, she plans to call them.  Plan made for CSW to call back later today to discuss a facility choice. ? ?1130: CSW spoke with [facility] inpatient [facility].  They will not pursue auth as [facility] has already pursued it and auth request from a different facility at the same level of care would not make any difference.  She will call pt wife and share this info.  ? ?1530: CSW called pt wife, no answer, left message.  ? ?Expected Discharge Plan: [place] ?Barriers to Discharge: Continued Medical Work up, SNF Pending bed offer ? ?Expected Discharge Plan and Services ?Expected Discharge Plan: [place] ?In-house Referral: Clinical Social Work ?  ?Post Acute Care Choice: [place] ?Living arrangements for the past 2 months: [place] ?Expected Discharge Date: 02/12/22               ?  ?  ?  ?  ?  ?  ?  ?  ?  ?  ? ? ?Social Determinants of Health (SDOH) Interventions ?  ? ?Readmission Risk Interventions ?   ? View  : No data to display.  ?  ?  ?  ? ? ?

## 2022-02-15 NOTE — Progress Notes (Signed)
?PROGRESS NOTE ? ? ? ?Jordan [name]  IRW:[id number] DOB: Jan 19, 1941 DOA: 02/08/2022 ?PCP: [name], MD  ? ? ? ?Brief Narrative:  ?81 y.o. WM PMHx HTN, Diastolic CHF, CAD, bradycardia  s/p PPM, type 2 diabetes mellitus, Parkinson's disease, gout, Narcolepsy, and OSA  ? ?Presents after having a fall at home.  Patient lives at home with his wife and normally ambulates with the use of a walker.  He states that his wife told him never to bend over, but bent over last night to pick up batteries that have fallen out of the remote when he lost his balance landing on his left hip.  He thinks he hit his head, but did not report any loss of consciousness.  After fall he was unable to bear weight on the left leg and reports being in severe pain.  He has had a intermittent cough ever since being hospitalized last month with pancreatitis with proximal sigmoid diverticulitis.  After that hospitalization he was sent to rehab prior to him being discharged home. ?  ?Upon admission into the emergency department patient was noted to have stable vital signs.  X-rays of the left hip and pelvis significant for mildly displaced intertrochanteric fracture of the proximal left femur.  Labs significant for WBC 14.9, BUN 24, creatinine 0.93, glucose 175 and all other vital signs maintained.  All other imaging noted chronic changes without any acute fractures or abnormality appreciated.  Patient had been given morphine for pain.  Case was discussed with Dr. Marlou Sa of orthopedics who plans on surgery this afternoon. ? ? ?Subjective: ?4/17 A/O x4, attempting to have BM ? ? ?Assessment & Plan: ?Covid vaccination; ?  ?Principal Problem: ?  Closed left hip fracture ([place]) secondary to fall at home ?Active Problems: ?  Leukocytosis ?  Chronic diastolic heart failure ([place]) ?  History of CVA (cerebrovascular accident) ?  Essential hypertension ?  HLD (hyperlipidemia) ?  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  Coronary atherosclerosis ?  Controlled type 2  diabetes mellitus with hyperglycemia, without long-term current use of insulin ([place]) ?  Fall at home, initial encounter ?  Parkinson disease ([place]) ?  Paroxysmal atrial fibrillation ([facility]) ? ?Closed left hip fracture ([place]) secondary to fall at home ?Acute.  Patient presents after having fallen at home found to have acute mildly displaced intertrochanteric left femur fracture on imaging.  Case was discussed with Dr. Marlou Sa who is undergoing surgery this afternoon. ?-Admit to a surgery telemetry bed ?-Hip fracture order set utilized ?-N.p.o. prior to surgery with orders placed to advance diet on procedure ?-Nonweightbearing on affected leg ?-Hydrocodone/morphine as needed for moderate to severe pain respectively ?-Bowel regimen ?-4/12 wife still holding CIR will reconsider. ?-4/13 patient's request to CIR has been DENIED.  They are appealing very unlikely to be overturned. ?- 4/13 have requested that RN obtain surgeon protocol for PT for left hip in order that patient can at least begin to get some work accomplished.  Ambulate daily with help ?-4/14 surgery PT protocol at bedside.  Patient has worked out today and it is sore. ? ?Leukocytosis ?Acute.  WBC elevated 14.9.    Chest x-ray did not note any acute abnormalities. Suspect secondary to acute fracture. ?-Check urinalysis ?-4/13 resolved ?  ?Chronic diastolic heart failure ([place]) ?Patient with a diastolic CHF last EF noted to be 60-65% with indeterminate diastolic dysfunction on 05/06/1949.  No signs of fluid overload noted on physical exam. ?-Strict in and out -3.7 L ?- Daily weight ?Filed  Weights  ? 02/09/22 0500 02/13/22 0546 02/15/22 0500  ?Weight: 101.7 kg 99.4 kg 101.6 kg  ?-Continue furosemide 20 mg daily ?  ?Essential hypertension ?On admission blood pressure appears relatively controlled.  Home medication regimen includes metoprolol 25 mg twice daily and furosemide 20 mg daily. ?-Continue home regimen as tolerated ?  ?Paroxysmal atrial fibrillation  ([facility]) ?-Patient was noted during last hospitalization and being in atrial fibrillation for 4 hours, but thought to be in the setting of infection.  No other events were reported.  He was not started on anticoagulation at that time. ?-Currently NSR ?  ?Parkinson disease ([place]) ?During prior hospitalization patient had noted to have hallucinations. ?-Delirium precautions ?-Continue Sinemet, amantadine, and Seroquel   ?  ?DM type II controlled with hyperglycemia, without long-term current use of insulin ([place]) ?Patient well controlled.  Last hemoglobin A1c 6.1 on 3/2.  On admission glucose 175.  Home medication regimen includes metformin 1000 mg daily. ?-4/11 moderate SSI ?- 4/13 increase Levemir 12 units daily ?-4/14 NovoLog 8 units qac ?-Hold metformin ? ?History of CVA (cerebrovascular accident) ?-Continue Plavix and statin ?  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  ?HLD (hyperlipidemia) ?-Home regimen includes atorvastatin 20 mg daily, and Lovaza 1000 mg daily. ?-Continue atorvastatin and Lovaza ? ?Obese (BMI 33.1 kg/m?) ?  ? ? ?  ?Mobility Assessment (last 72 hours)   ? ? Mobility Assessment   ? ? [place] Name 02/14/22 2053 02/14/22 0900 02/13/22 1742 02/12/22 2123 02/12/22 1500  ? Does patient have an order for bedrest or is patient medically unstable No - Continue assessment No - Continue assessment -- No - Continue assessment --  ? What is the highest level of mobility based on the progressive mobility assessment? Level 3 (Stands with assist) - Balance while standing  and cannot march in place Level 3 (Stands with assist) - Balance while standing  and cannot march in place Level 3 (Stands with assist) - Balance while standing  and cannot march in place Level 3 (Stands with assist) - Balance while standing  and cannot march in place Level 3 (Stands with assist) - Balance while standing  and cannot march in place  ? Is the above level different from baseline mobility prior to current illness? Yes -  Recommend PT order Yes - Recommend PT order -- Yes - Recommend PT order --  ? ?  ?  ? ?  ? ? ?DVT prophylaxis:  ?Code Status: Full ?Family Communication:  ?Status is: Inpatient ? ? ? ?Dispo: The patient is from: Home ?             Anticipated d/c is to: Home ?             Anticipated d/c date is: 2 days ?             Patient currently medically stable.  Patient and wife appealing discharge plan ? ? ? ? ? ?Consultants:  ?Orthopedic surgery ? ? ?Procedures/Significant Events:  ?4/11 LEFT Hip INTRAMEDULLARY (IM) NAIL INTERTROCHANTRIC ? ? ?I have personally reviewed and interpreted all radiology studies and my findings are as above. ? ?VENTILATOR SETTINGS: ? ? ? ?Cultures ? ? ?Antimicrobials: ?Anti-infectives (From admission, onward)  ? ? Start     Ordered Stop  ? 02/09/22 0600  ceFAZolin (ANCEF) IVPB 2g/100 mL premix       ? 02/08/22 1334 02/08/22 1523  ? 02/08/22 2200  ceFAZolin (ANCEF) IVPB 2g/100 mL premix       ?  02/08/22 1854 02/09/22 0609  ? ?  ?  ? ? ?Devices ?  ? ?LINES / TUBES:  ? ? ? ? ?Continuous Infusions: ? methocarbamol (ROBAXIN) IV    ? ? ? ?Objective: ?Vitals:  ? 02/15/22 0516 02/15/22 0907 02/15/22 0948 02/15/22 1431  ?BP: (!) 121/58 132/67 107/73 110/82  ?Pulse: 68 70 70 69  ?Resp: '16  17 18  '$ ?Temp: 97.6 ?F (36.4 ?C)  98.2 ?F (36.8 ?C) (!) 97.4 ?F (36.3 ?C)  ?TempSrc: Oral  Oral Oral  ?SpO2: 97%   96%  ?Weight:      ?Height:      ? ? ?Intake/Output Summary (Last 24 hours) at 02/15/2022 1454 ?Last data filed at 02/15/2022 1230 ?Gross per 24 hour  ?Intake 240 ml  ?Output 1000 ml  ?Net -760 ml  ? ? ?Filed Weights  ? 02/09/22 0500 02/13/22 0546 02/15/22 0500  ?Weight: 101.7 kg 99.4 kg 101.6 kg  ? ? ?Examination: ? ?General: No acute respiratory distress ?Eyes: negative scleral hemorrhage, negative anisocoria, negative icterus ?ENT: Negative Runny nose, negative gingival bleeding, ?Neck:  Negative scars, masses, torticollis, lymphadenopathy, JVD ?Lungs: Clear to auscultation bilaterally without wheezes or  crackles ?Cardiovascular: Regular rate and rhythm without murmur gallop or rub normal S1 and S2 ?Abdomen: negative abdominal pain, nondistended, positive soft, bowel sounds, no rebound, no ascites, no appreciable mass ?Extremitie

## 2022-02-15 NOTE — Plan of Care (Signed)
  Problem: Activity: Goal: Risk for activity intolerance will decrease Outcome: Progressing   Problem: Nutrition: Goal: Adequate nutrition will be maintained Outcome: Progressing   Problem: Coping: Goal: Level of anxiety will decrease Outcome: Progressing   Problem: Elimination: Goal: Will not experience complications related to bowel motility Outcome: Progressing   

## 2022-02-16 DIAGNOSIS — S72142A Displaced intertrochanteric fracture of left femur, initial encounter for closed fracture: Secondary | ICD-10-CM | POA: Diagnosis not present

## 2022-02-16 DIAGNOSIS — I2510 Atherosclerotic heart disease of native coronary artery without angina pectoris: Secondary | ICD-10-CM | POA: Diagnosis not present

## 2022-02-16 DIAGNOSIS — I5032 Chronic diastolic (congestive) heart failure: Secondary | ICD-10-CM | POA: Diagnosis not present

## 2022-02-16 DIAGNOSIS — E1165 Type 2 diabetes mellitus with hyperglycemia: Secondary | ICD-10-CM | POA: Diagnosis not present

## 2022-02-16 LAB — COMPREHENSIVE METABOLIC PANEL
ALT: 8 U/L (ref 0–44)
AST: 17 U/L (ref 15–41)
Albumin: 2.9 g/dL — ABNORMAL LOW (ref 3.5–5.0)
Alkaline Phosphatase: 83 U/L (ref 38–126)
Anion gap: 10 (ref 5–15)
BUN: 19 mg/dL (ref 8–23)
CO2: 26 mmol/L (ref 22–32)
Calcium: 9 mg/dL (ref 8.9–10.3)
Chloride: 100 mmol/L (ref 98–111)
Creatinine, Ser: 0.82 mg/dL (ref 0.61–1.24)
GFR, Estimated: >60 mL/min (ref >60)
Glucose, Bld: 152 mg/dL — ABNORMAL HIGH (ref 70–99)
Potassium: 4 mmol/L (ref 3.5–5.1)
Sodium: 136 mmol/L (ref 135–145)
Total Bilirubin: 0.7 mg/dL (ref 0.3–1.2)
Total Protein: 6.2 g/dL — ABNORMAL LOW (ref 6.5–8.1)

## 2022-02-16 LAB — CBC WITH DIFFERENTIAL/PLATELET
Abs Immature Granulocytes: 0.05 10*3/uL (ref 0.00–0.07)
Basophils Absolute: 0.1 10*3/uL (ref 0.0–0.1)
Basophils Relative: 1 %
Eosinophils Absolute: 0.7 10*3/uL — ABNORMAL HIGH (ref 0.0–0.5)
Eosinophils Relative: 7 %
HCT: 36.6 % — ABNORMAL LOW (ref 39.0–52.0)
Hemoglobin: 11.8 g/dL — ABNORMAL LOW (ref 13.0–17.0)
Immature Granulocytes: 1 %
Lymphocytes Relative: 24 %
Lymphs Abs: 2.4 10*3/uL (ref 0.7–4.0)
MCH: 29.1 pg (ref 26.0–34.0)
MCHC: 32.2 g/dL (ref 30.0–36.0)
MCV: 90.4 fL (ref 80.0–100.0)
Monocytes Absolute: 1 10*3/uL (ref 0.1–1.0)
Monocytes Relative: 10 %
Neutro Abs: 6 10*3/uL (ref 1.7–7.7)
Neutrophils Relative %: 57 %
Platelets: 299 10*3/uL (ref 150–400)
RBC: 4.05 MIL/uL — ABNORMAL LOW (ref 4.22–5.81)
RDW: 13.3 % (ref 11.5–15.5)
WBC: 10.2 10*3/uL (ref 4.0–10.5)
nRBC: 0 % (ref 0.0–0.2)

## 2022-02-16 LAB — GLUCOSE, CAPILLARY
Glucose-Capillary: 114 mg/dL — ABNORMAL HIGH (ref 70–99)
Glucose-Capillary: 128 mg/dL — ABNORMAL HIGH (ref 70–99)
Glucose-Capillary: 165 mg/dL — ABNORMAL HIGH (ref 70–99)
Glucose-Capillary: 167 mg/dL — ABNORMAL HIGH (ref 70–99)
Glucose-Capillary: 168 mg/dL — ABNORMAL HIGH (ref 70–99)
Glucose-Capillary: 179 mg/dL — ABNORMAL HIGH (ref 70–99)
Glucose-Capillary: 185 mg/dL — ABNORMAL HIGH (ref 70–99)

## 2022-02-16 LAB — PHOSPHORUS: Phosphorus: 4.4 mg/dL (ref 2.5–4.6)

## 2022-02-16 LAB — MAGNESIUM: Magnesium: 1.9 mg/dL (ref 1.7–2.4)

## 2022-02-16 MED ORDER — HEPARIN SODIUM (PORCINE) 5000 UNIT/ML IJ SOLN
5000.0000 [IU] | Freq: Three times a day (TID) | INTRAMUSCULAR | Status: DC
  Administered 2022-02-16 – 2022-02-18 (×5): 5000 [IU] via SUBCUTANEOUS
  Filled 2022-02-16 (×5): qty 1

## 2022-02-16 NOTE — Progress Notes (Signed)
Patient has home CPAP at bedside. No assistance needed from RT at this time.  ?

## 2022-02-16 NOTE — TOC Progression Note (Addendum)
Transition of Care (TOC) - Progression Note  ? ? ?Patient Details  ?Name: Jordan [name] ?MRN: [id number] ?Date of Birth: Oct 15, 1941 ? ?Transition of Care (TOC) CM/SW Contact  ?Joanne Chars, LCSW ?Phone Number: ?02/16/2022, 10:22 AM ? ?Clinical Narrative:   CSW spoke with pt wife [name] and she is leaning towards accepting offer at [facility].  She asked for response from [facility]. ? ?1010: CSW spoke with Otila Kluver at [facility], they are out of network with [place] [facility]. CSW spoke with [name] and she would like to accept the [facility] offer.  CSW confirmed with Debra/white Larey Dresser that they can accept and will start auth today.   ? ?1600: Debra from [facility] calls, she does have authorization for SNF. ? ? ? ?Expected Discharge Plan: [place] ?Barriers to Discharge: Continued Medical Work up, SNF Pending bed offer ? ?Expected Discharge Plan and Services ?Expected Discharge Plan: [place] ?In-house Referral: Clinical Social Work ?  ?Post Acute Care Choice: [place] ?Living arrangements for the past 2 months: [place] ?Expected Discharge Date: 02/12/22               ?  ?  ?  ?  ?  ?  ?  ?  ?  ?  ? ? ?Social Determinants of Health (SDOH) Interventions ?  ? ?Readmission Risk Interventions ?   ? View : No data to display.  ?  ?  ?  ? ? ?

## 2022-02-16 NOTE — Progress Notes (Signed)
Occupational Therapy Treatment ?Patient Details ?Name: Jordan [name] ?MRN: [id number] ?DOB: 02/22/1941 ?Today's Date: 02/16/2022 ? ? ?History of present illness Pt is an 81 y.o. male admitted 02/08/22 after fall onto L-hip sustaining L intertrochanteric hip fx. S/p L hip IMN 4/10. Of note, recent admission to 12/18/21 with worsening hallucinations, attributed to pt's Parkinson's disease; d/c to CIR (home 01/17/22), then outpatient neuro eval started 4/4. Other PMH includes OSA, DM2, HTN, CHF, CVA, L2 compression fx (s/p L2 kyphoplasty 12/29/21), frequent falls. ?  ?OT comments ? Pt slowly progressing towards goals, able to complete sit to stand transfer x1 during session and complete therex and UB dressing. Pt encouraged to complete LB therex while seated in chair throughout the day to increase strength. Pt min A for UB dressing, mod A +2 for sit to stand transfer with RW. Pt requiring verbal and tactile cuing to stand in upright position, able to stand for ~1-2 mins before needing to sit back down. Pt presenting with impairments listed below, will follow acutely. Continue to recommend AIR at d/c, if unable then pt will need SNF level rehab.  ? ?Recommendations for follow up therapy are one component of a multi-disciplinary discharge planning process, led by the attending physician.  Recommendations may be updated based on patient status, additional functional criteria and insurance authorization. ?   ?Follow Up Recommendations ? Acute inpatient rehab (3hours/day)  ?  ?Assistance Recommended at Discharge Intermittent Supervision/Assistance  ?Patient can return home with the following ? A lot of help with walking and/or transfers;A lot of help with bathing/dressing/bathroom;Assistance with cooking/housework;Assist for transportation;Help with stairs or ramp for entrance ?  ?Equipment Recommendations ? Other (comment);None recommended by OT  ?  ?Recommendations for Other Services Rehab consult;PT consult ? ?  ?Precautions  / Restrictions Precautions ?Precautions: Fall;Other (comment) ?Restrictions ?Weight Bearing Restrictions: Yes ?LLE Weight Bearing: Partial weight bearing ?LLE Partial Weight Bearing Percentage or Pounds: 50 ?Other Position/Activity Restrictions: confirmed with PA and MD 4/14  ? ? ?  ? ?Mobility Bed Mobility ?  ?  ?  ?  ?  ?  ?  ?General bed mobility comments: OOB in chair upon arrival ?  ? ?Transfers ?Overall transfer level: Needs assistance ?Equipment used: Rolling walker (2 wheels) ?Transfers: Sit to/from Stand, Bed to chair/wheelchair/BSC ?Sit to Stand: Mod assist, +2 physical assistance ?  ?  ?  ?  ?  ?  ?  ?  ?Balance Overall balance assessment: Needs assistance ?Sitting-balance support: Feet supported, Single extremity supported ?Sitting balance-Leahy Scale: Fair ?Sitting balance - Comments: 1-2 UE support with supervision to min guard for safety ?  ?Standing balance support: Reliant on assistive device for balance, Bilateral upper extremity supported ?Standing balance-Leahy Scale: Poor ?Standing balance comment: reliant on BUE support and external assist ?  ?  ?  ?  ?  ?  ?  ?  ?  ?  ?  ?   ? ?ADL either performed or assessed with clinical judgement  ? ?ADL   ?  ?  ?  ?  ?  ?  ?  ?  ?Upper Body Dressing : Minimal assistance;Sitting ?Upper Body Dressing Details (indicate cue type and reason): to don gown on back side ?  ?  ?  ?  ?  ?  ?  ?  ?Functional mobility during ADLs: Moderate assistance;+2 for physical assistance;Rolling walker (2 wheels) ?  ?  ? ?Extremity/Trunk Assessment Upper Extremity Assessment ?Upper Extremity Assessment: Generalized weakness ?  ?Lower Extremity Assessment ?  Lower Extremity Assessment: Defer to PT evaluation ?  ?  ?  ? ?Vision   ?Vision Assessment?: No apparent visual deficits ?  ?Perception Perception ?Perception: Not tested ?  ?Praxis Praxis ?Praxis: Not tested ?  ? ?Cognition Arousal/Alertness: Awake/alert ?Behavior During Therapy: [facility] for tasks assessed/performed ?Overall  Cognitive Status: History of cognitive impairments - at baseline ?  ?  ?  ?  ?  ?  ?  ?  ?  ?  ?  ?  ?  ?  ?  ?  ?General Comments: unable to recall use of Stedy vs RW in previous therapy session ?  ?  ?   ?Exercises General Exercises - Lower Extremity ?Ankle Circles/Pumps: AROM, 10 reps, Both ?Long Arc Quad: AROM, Both, 10 reps, Seated ?Hip Flexion/Marching: AROM, Both, 10 reps, Seated ?Heel Raises: AROM, Left, 10 reps ? ?  ?Shoulder Instructions   ? ? ?  ?General Comments VSS on RA  ? ? ?Pertinent Vitals/ Pain       Pain Assessment ?Pain Assessment: Faces ?Pain Score: 3  ?Faces Pain Scale: Hurts little more ?Pain Location: LLE with WB and movement ?Pain Descriptors / Indicators: Sore, Operative site guarding, Grimacing ?Pain Intervention(s): Limited activity within patient's tolerance, Monitored during session ? ?Home Living   ?  ?  ?  ?  ?  ?  ?  ?  ?  ?  ?  ?  ?  ?  ?  ?  ?  ?  ? ?  ?Prior Functioning/Environment    ?  ?  ?  ?   ? ?Frequency ? Min 2X/week  ? ? ? ? ?  ?Progress Toward Goals ? ?OT Goals(current goals can now be found in the care plan section) ? Progress towards OT goals: Progressing toward goals ? ?Acute Rehab OT Goals ?Patient Stated Goal: to get better ?OT Goal Formulation: With patient ?Time For Goal Achievement: 02/24/22 ?Potential to Achieve Goals: Fair ?ADL Goals ?Pt Will Perform Grooming: with min assist;standing ?Pt Will Perform Upper Body Dressing: with min guard assist;sitting ?Pt Will Perform Lower Body Dressing: with adaptive equipment;sit to/from stand;sitting/lateral leans;with min assist;with caregiver independent in assisting ?Pt Will Transfer to Toilet: with min assist;ambulating;bedside commode;squat pivot transfer;stand pivot transfer  ?Plan Discharge plan remains appropriate   ? ?Co-evaluation ? ? ?   ?  ?  ?  ?  ? ?  ?AM-PAC OT "6 Clicks" Daily Activity     ?Outcome Measure ? ? Help from another person eating meals?: None ?Help from another person taking care of personal  grooming?: A Little ?Help from another person toileting, which includes using toliet, bedpan, or urinal?: A Lot ?Help from another person bathing (including washing, rinsing, drying)?: A Lot ?Help from another person to put on and taking off regular upper body clothing?: A Little ?Help from another person to put on and taking off regular lower body clothing?: A Lot ?6 Click Score: 16 ? ?  ?End of Session Equipment Utilized During Treatment: Rolling walker (2 wheels);Gait belt ? ?OT Visit Diagnosis: Unsteadiness on feet (R26.81);Other abnormalities of gait and mobility (R26.89);Muscle weakness (generalized) (M62.81) ?  ?Activity Tolerance Patient tolerated treatment well ?  ?Patient Left in chair;with call bell/phone within reach;with chair alarm set;with family/visitor present ?  ?Nurse Communication Mobility status ?  ? ?   ? ?Time: 6203-5597 ?OT Time Calculation (min): 16 min ? ?Charges: OT General Charges ?$OT Visit: 1 Visit ?OT Treatments ?$Therapeutic Activity: 8-22 mins ? ?Lynnda Child,  OTD, OTR/L ?Acute Rehab ?[phone] ? ? ?Kaylyn Lim ?02/16/2022, 4:14 PM ?

## 2022-02-16 NOTE — H&P (Incomplete)
? ? ?Physical Medicine and Rehabilitation Admission H&P ? ?  ?Chief Complaint  ?Patient presents with  ? Hip Pain  ?  Pt had mechanical fall onto left hip and is having hip pain 7/10. History of left hip fracture  ?: ?HPI: Jordan [name] is an 81 year old right-handed male with history of hypertension, diastolic congestive heart failure, CAD, bradycardia status post pacemaker 2017 per Dr. 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  Per chart review patient lives with spouse.  1 level apartment with elevator.  Ambulatory limited distances with supervision from wife.  Wife assist with majority of ADLs.  Presented 02/08/2022 after a fall while picking up batteries from the floor from his remote control.  There was no loss of consciousness.  Cranial CT scan as well as CT cervical spine negative.  Findings of comminuted intertrochanteric fracture of the left femur and underwent ORIF 02/09/2022 per Dr. Marcene Duos..  Patient is partial weightbearing left lower extremity.  Placed on aspirin 81 mg twice daily for DVT prophylaxis.  Patient remains on Plavix as prior to admission.  Therapy evaluations completed due to patient decreased functional mobility was admitted for a comprehensive rehab program. ? ?Review of Systems  ?Constitutional:  Negative for chills and fever.  ?HENT:  Negative for hearing loss.   ?Eyes:  Negative for blurred vision and double vision.  ?Respiratory:  Negative for cough.   ?     Shortness of breath with exertion  ?Cardiovascular:  Positive for leg swelling. Negative for chest pain and palpitations.  ?Gastrointestinal:  Positive for constipation. Negative for heartburn and nausea.  ?Genitourinary:  Positive for urgency. Negative for dysuria, flank pain and hematuria.   ?Musculoskeletal:  Positive for back pain, joint pain and myalgias.  ?Skin:  Negative for rash.  ?All other systems reviewed and are negative. ?Past Medical History:  ?Diagnosis Date  ? Arthritis   ? R shoulder, bone spur  ? Arthritis   ? "hands" (03/25/2016)  ? Chronic diastolic heart failure ([place])   ? Chronic lower back pain   ? Coronary heart disease   ? Dr Pernell Dupre  ? H/O cardiovascular stress test   ? perhaps last one was 2009  ? Heart murmur   ? 12/29/11 echo: mild MR, no AS, trivial TR  ? History of gout   ? Hyperlipidemia   ? Hypertension   ? saw Dr. Linard Millers last earl;y- 2014, cardiac cath. last done ?2009, blocks seen didn't require any intervention at that point.  ? Narcolepsy   ? [name] 04-11-97; Mean Latency 1.63mn, SOREM 2  ? OSA on CPAP   ? NPSG 12-13-98 AHI 22.7  ? PONV (postoperative nausea and vomiting)   ? Presence of permanent cardiac pacemaker   ? Shortness of breath   ? with exertion   ? Stroke ([facility]   ? Type [place] mellitus [place]   ? ?Past Surgical History:  ?Procedure Laterality Date  ? CARDIAC CATHETERIZATION  2009?  ? EP IMPLANTABLE DEVICE N/A 03/25/2016  ? Procedure: Pacemaker Implant - Dual Chamber;  Surgeon: GEvans Lance MD;  [facility];  Service: Cardiovascular;  Laterality: N/A;  ? FRACTURE SURGERY    ? INSERT / REPLACE / REMOVE PACEMAKER  03/24/2016  ? INTRAMEDULLARY (IM) NAIL INTERTROCHANTERIC Left 02/08/2022  ? Procedure: INTRAMEDULLARY (IM) NAIL  INTERTROCHANTRIC;  Surgeon: Meredith Pel, MD;  Location: [place];  Service: Orthopedics;  Laterality: Left;  ? IR KYPHO LUMBAR INC FX REDUCE BONE BX UNI/BIL CANNULATION INC/IMAGING  12/29/2021  ? IR RADIOLOGIST EVAL & MGMT  12/15/2021  ? LEFT AND RIGHT HEART CATHETERIZATION WITH CORONARY ANGIOGRAM N/A 06/05/2014  ? Procedure: LEFT AND RIGHT HEART CATHETERIZATION WITH CORONARY ANGIOGRAM;  Surgeon: Sinclair Grooms, MD;  Location: [facility] C[facility];  Service: Cardiovascular;  Laterality: N/A;  ? NASAL FRACTURE SURGERY    ?  SHOULDER ARTHROSCOPY WITH ROTATOR CUFF REPAIR AND SUBACROMIAL DECOMPRESSION Right 08/16/2013  ? Procedure: SHOULDER ARTHROSCOPY WITH ROTATOR CUFF REPAIR AND SUBACROMIAL DECOMPRESSION;  Surgeon: [name], MD;  Location: [place];  Service: Orthopedics;  Laterality: Right;  Right shoulder arthroscopy rotator cuff repair, subacromial decompression.  ? TONSILLECTOMY    ? ?Family History  ?Problem Relation Age of Onset  ? Sleep apnea Sister   ? Colon cancer Sister   ? ?Social History:  reports that he quit smoking about 54 years ago. His smoking use included cigarettes. He has a 15.00 pack-year smoking history. He has never used smokeless tobacco. He reports current alcohol use. He reports that he does not use drugs. ?Allergies: No Known Allergies ?Medications Prior to Admission  ?Medication Sig Dispense Refill  ? acetaminophen (TYLENOL) 325 MG tablet Take 1-2 tablets (325-650 mg total) by mouth every 4 (four) hours as needed for mild pain. (Patient taking differently: Take 650 mg by mouth every 4 (four) hours as needed for mild pain.)    ? amantadine (SYMMETREL) 100 MG capsule Take 1 capsule (100 mg total) by mouth daily. 30 capsule 0  ? atorvastatin (LIPITOR) 40 MG tablet Take 0.5 tablets (20 mg total) by mouth daily. 30 tablet 0  ? bifidobacterium infantis (ALIGN) capsule Take 1 capsule by mouth daily.    ? Camphor-Menthol-Methyl Sal (HM SALONPAS PAIN RELIEF EX) Apply 1 patch topically daily as needed (pain).    ? carbidopa-levodopa (SINEMET) 25-100 MG tablet Take 1 tablet by mouth 3 (three) times daily. 90 tablet 5  ? Carbidopa-Levodopa ER (SINEMET CR) 25-100 MG tablet controlled release Take 1 tablet by mouth at bedtime.    ? cholecalciferol (VITAMIN D3) 25 MCG (1000 UNIT) tablet Take 1,000 Units by mouth daily.    ? clopidogrel (PLAVIX) 75 MG tablet Take 1 tablet (75 mg total) by mouth daily. 30 tablet 11  ? furosemide (LASIX) 20 MG tablet USE AS NEEDED FOR UP TO 5 DAYS IF SWELLING IN LEGS RECURS.  CONTACT CARDIOLOGY IS SWELLING DOES NOT IMPROVE OR GETS WORSE. 15 tablet 0  ? lidocaine (LIDODERM) 5 % Place 1 patch onto the skin daily as needed. Purchase over the counter. On for 12 hours and off for 12 hours 30 patch 0  ? magnesium gluconate (MAGONATE) 500 MG tablet Take 0.5 tablets (250 mg total) by mouth at bedtime. 30 tablet 0  ? Melatonin 10 MG TABS Take 10 mg by mouth at bedtime.    ? metFORMIN (GLUCOPHAGE) 1000 MG tablet Take 1 tablet (1,000 mg total) by mouth daily with breakfast. 30 tablet 0  ? metoprolol tartrate (LOPRESSOR) 25 MG tablet Take 1 tablet (25 mg total) by mouth 2 (two) times daily. 60 tablet 0  ? Multiple Vitamin (MULTIVITAMIN WITH MINERALS) TABS tablet Take 1 tablet by mouth daily.    ? NITROSTAT 0.4 MG SL tablet PLACE 1 TABLET (0.4 MG TOTAL) UNDER THE TONGUE EVERY 5 (FIVE) MINUTES AS NEEDED FOR  CHEST PAIN. (Patient taking differently: Place 0.4 mg under the tongue every 5 (five) minutes as needed for chest pain.) 25 tablet 5  ? Omega 3 1200 MG CAPS Take 1,200 mg by mouth daily.    ? polyethylene glycol (MIRALAX / GLYCOLAX) 17 g packet Take 17 g by mouth daily. 14 each 0  ? polyvinyl alcohol (LIQUIFILM TEARS) 1.4 % ophthalmic solution Place 1 drop into both eyes as needed for dry eyes. 15 mL 0  ? QUEtiapine (SEROQUEL) 25 MG tablet Take 1/2 tablets (12.5 mg total) by mouth at bedtime. 15 tablet 0  ? senna-docusate (SENOKOT-S) 8.6-50 MG tablet Take 2 tablets by mouth daily with supper. 60 tablet 0  ? Vitamin D, Ergocalciferol, (DRISDOL) 1.25 MG (50000 UNIT) CAPS capsule Take 1 capsule (50,000 Units total) by mouth every 7 (seven) days. 5 capsule 0  ? Wheat Dextrin (BENEFIBER PO) Take 10 mLs by mouth daily.    ? PRESCRIPTION MEDICATION See admin instructions. CPAP- At bedtime    ? ? ? ? ?Home: ?Home Living ?Family/patient expects to be discharged to:: Private residence ?Living Arrangements: Spouse/significant other, Children ?Available Help at Discharge: Family ?Type of Home: Apartment ?Home  Access: Elevator ?Home Layout: One level ?Bathroom Shower/Tub: Tub/shower unit, Walk-in shower ?Bathroom Toilet: Handicapped height ?Home Equipment: Shower seat, Conservation officer, nature (2 wheels) ?  ?Functional History: ?Prior Func

## 2022-02-16 NOTE — Progress Notes (Signed)
?PROGRESS NOTE ? ? ? ?Jordan [name]  XNT:[id number] DOB: 09-13-1941 DOA: 02/08/2022 ?PCP: [name], MD  ? ? ? ?Brief Narrative:  ?81 y.o. WM PMHx HTN, Diastolic CHF, CAD, bradycardia  s/p PPM, type 2 diabetes mellitus, Parkinson's disease, gout, Narcolepsy, and OSA  ? ?Presents after having a fall at home.  Patient lives at home with his wife and normally ambulates with the use of a walker.  He states that his wife told him never to bend over, but bent over last night to pick up batteries that have fallen out of the remote when he lost his balance landing on his left hip.  He thinks he hit his head, but did not report any loss of consciousness.  After fall he was unable to bear weight on the left leg and reports being in severe pain.  He has had a intermittent cough ever since being hospitalized last month with pancreatitis with proximal sigmoid diverticulitis.  After that hospitalization he was sent to rehab prior to him being discharged home. ?  ?Upon admission into the emergency department patient was noted to have stable vital signs.  X-rays of the left hip and pelvis significant for mildly displaced intertrochanteric fracture of the proximal left femur.  Labs significant for WBC 14.9, BUN 24, creatinine 0.93, glucose 175 and all other vital signs maintained.  All other imaging noted chronic changes without any acute fractures or abnormality appreciated.  Patient had been given morphine for pain.  Case was discussed with Dr. Marlou Sa of orthopedics who plans on surgery this afternoon. ? ? ?Subjective: ?4/18 A/O x4, A/O x4 sitting comfortably in chair.  States he is tired from PT today.  Somewhat anxious about when he will be discharged. ? ? ?Assessment & Plan: ?Covid vaccination; ?  ?Principal Problem: ?  Closed left hip fracture ([place]) secondary to fall at home ?Active Problems: ?  Leukocytosis ?  Chronic diastolic heart failure ([place]) ?  History of CVA (cerebrovascular accident) ?  Essential hypertension ?   HLD (hyperlipidemia) ?  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  Coronary atherosclerosis ?  Controlled type 2 diabetes mellitus with hyperglycemia, without long-term current use of insulin ([place]) ?  Fall at home, initial encounter ?  Parkinson disease ([place]) ?  Paroxysmal atrial fibrillation (H[facility]) ? ?Closed left hip fracture ([place]) secondary to fall at home ?Acute.  Patient presents after having fallen at home found to have acute mildly displaced intertrochanteric left femur fracture on imaging.  Case was discussed with Dr. Marlou Sa who is undergoing surgery this afternoon. ?-Admit to a surgery telemetry bed ?-Hip fracture order set utilized ?-N.p.o. prior to surgery with orders placed to advance diet on procedure ?-Nonweightbearing on affected leg ?-Hydrocodone/morphine as needed for moderate to severe pain respectively ?-Bowel regimen ?-4/12 wife still holding CIR will reconsider. ?-4/13 patient's request to CIR has been DENIED.  They are appealing very unlikely to be overturned. ?- 4/13 have requested that RN obtain surgeon protocol for PT for left hip in order that patient can at least begin to get some work accomplished.  Ambulate daily with help ?-4/14 surgery PT protocol at bedside.  Patient has worked out today and it is sore. ? ?Leukocytosis ?Acute.  WBC elevated 14.9.    Chest x-ray did not note any acute abnormalities. Suspect secondary to acute fracture. ?-Check urinalysis ?-4/13 resolved ?  ?Chronic diastolic heart failure ([place]) ?Patient with a diastolic CHF last EF noted to be 60-65% with indeterminate diastolic dysfunction on 07/07/7590.  No signs of fluid overload noted on physical exam. ?-Strict in and out -5.7 L ?- Daily weight ?Filed Weights  ? 02/13/22 0546 02/15/22 0500 02/16/22 0500  ?Weight: 99.4 kg 101.6 kg 98.1 kg  ?-4/18 continue furosemide 20 mg daily.  Would monitor closely may have to skip some doses. ?  ?Essential hypertension ?On admission blood pressure appears relatively controlled.   ?-Lasix 20 mg  daily ?-Hydralazine as needed ?-Metoprolol 25 mg BID ?  ?Paroxysmal atrial fibrillation (HCC) ?-Patient was noted during last hospitalization and being in atrial fibrillation for 4 hours, but thought to be in the setting of infection.  No other events were reported.  He was not started on anticoagulation at that time. ?-Currently NSR ?  ?Parkinson disease ([place]) ?During prior hospitalization patient had noted to have hallucinations. ?-Delirium precautions ?-Continue Sinemet, amantadine, and Seroquel   ?  ?DM type II controlled with hyperglycemia, without long-term current use of insulin ([place]) ?Patient well controlled.  Last hemoglobin A1c 6.1 on 3/2.  On admission glucose 175.  Home medication regimen includes metformin 1000 mg daily. ?-4/11 moderate SSI ?- 4/13 increase Levemir 12 units daily ?-4/14 NovoLog 8 units qac ?-Hold metformin ? ?History of CVA (cerebrovascular accident) ?-Continue Plavix and statin ?  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  ?HLD (hyperlipidemia) ?-Home regimen includes atorvastatin 20 mg daily, and Lovaza 1000 mg daily. ?-Continue atorvastatin and Lovaza ?-4/18 lipid panel pending ? ?Obese (BMI 33.1 kg/m?) ?-Patient to address with PCP ? ? ?  ?Mobility Assessment (last 72 hours)   ? ? Mobility Assessment   ? ? [place] Name 02/16/22 1600 02/15/22 1535 02/14/22 2053 02/14/22 0900  ?  ? Does patient have an order for bedrest or is patient medically unstable -- -- No - Continue assessment No - Continue assessment   ? What is the highest level of mobility based on the progressive mobility assessment? Level 3 (Stands with assist) - Balance while standing  and cannot march in place Level 3 (Stands with assist) - Balance while standing  and cannot march in place Level 3 (Stands with assist) - Balance while standing  and cannot march in place Level 3 (Stands with assist) - Balance while standing  and cannot march in place   ? Is the above level different from baseline mobility prior to  current illness? -- -- Yes - Recommend PT order Yes - Recommend PT order   ? ?  ?  ? ?  ? ? ?DVT prophylaxis: Subcu heparin ?Code Status: Full ?Family Communication:  ?Status is: Inpatient ? ? ? ?Dispo: The patient is from: Home ?             Anticipated d/c is to: Home ?             Anticipated d/c date is: 2 days ?             Patient currently medically stable.  Patient and wife appealing discharge plan ? ? ? ? ? ?Consultants:  ?Orthopedic surgery ? ? ?Procedures/Significant Events:  ?4/11 LEFT Hip INTRAMEDULLARY (IM) NAIL INTERTROCHANTRIC ? ? ?I have personally reviewed and interpreted all radiology studies and my findings are as above. ? ?VENTILATOR SETTINGS: ? ? ? ?Cultures ? ? ?Antimicrobials: ?Anti-infectives (From admission, onward)  ? ? Start     Ordered Stop  ? 02/09/22 0600  ceFAZolin (ANCEF) IVPB 2g/100 mL premix       ? 02/08/22 1334 02/08/22 1523  ? 02/08/22 2200  ceFAZolin (ANCEF) IVPB 2g/100 mL premix       ? 02/08/22 1854 02/09/22 0609  ? ?  ?  ? ? ?Devices ?  ? ?LINES / TUBES:  ? ? ? ? ?Continuous Infusions: ? methocarbamol (ROBAXIN) IV    ? ? ? ?Objective: ?Vitals:  ? 02/16/22 0500 02/16/22 0744 02/16/22 1435 02/16/22 2020  ?BP:  123/68 96/65 (!) 153/89  ?Pulse:  63 79 74  ?Resp:  '17 17 17  '$ ?Temp:  97.9 ?F (36.6 ?C) 97.6 ?F (36.4 ?C) 97.9 ?F (36.6 ?C)  ?TempSrc:  Oral Oral   ?SpO2:  93% 93% 94%  ?Weight: 98.1 kg     ?Height:      ? ? ?Intake/Output Summary (Last 24 hours) at 02/16/2022 2032 ?Last data filed at 02/16/2022 1218 ?Gross per 24 hour  ?Intake --  ?Output 1900 ml  ?Net -1900 ml  ? ? ?Filed Weights  ? 02/13/22 0546 02/15/22 0500 02/16/22 0500  ?Weight: 99.4 kg 101.6 kg 98.1 kg  ? ? ?Examination: ? ?General: No acute respiratory distress ?Eyes: negative scleral hemorrhage, negative anisocoria, negative icterus ?ENT: Negative Runny nose, negative gingival bleeding, ?Neck:  Negative scars, masses, torticollis, lymphadenopathy, JVD ?Lungs: Clear to auscultation bilaterally without wheezes or  crackles ?Cardiovascular: Regular rate and rhythm without murmur gallop or rub normal S1 and S2 ?Abdomen: negative abdominal pain, nondistended, positive soft, bowel sounds, no rebound, no ascites, no appreciable

## 2022-02-16 NOTE — Progress Notes (Signed)
Nutrition Follow-up ? ?DOCUMENTATION CODES:  ?Obesity unspecified ? ?INTERVENTION:  ?-Continue Glucerna Shake po TID, each supplement provides 220 kcal and 10 grams of protein ?-Continue MVI with minerals daily ? ?NUTRITION DIAGNOSIS:  ?Increased nutrient needs related to post-op healing as evidenced by estimated needs. -- ongoing ? ?GOAL:  ?Patient will meet greater than or equal to 90% of their needs -- progressing ? ?MONITOR:  ?PO intake, Supplement acceptance, Labs, Weight trends ? ?REASON FOR ASSESSMENT:  ?Consult ?Hip fracture protocol ? ?ASSESSMENT:  ?Pt with PMH significant for HTN, CHF, CAD, bradycardia s/p PPM, type 2 DM, Parkinson's disease, gout, narcolepsy, and OSA admitted with closed L hip fx 2/2 mechanical fall. ? ?4/11 s/p ORIF L hip fx ? ?Pt awaiting SNF placement; pending insurance authorization. Reports appetite has been good. Last 8 meal completions documented as 50-100% (87.5% avg meal intake). Per RN, pt doing well with Glucerna supplementation. Recommend continue current nutrition plan of care.  ? ?UOP: 2031m x24 hours ?I/O: -47843msince admit ? ?Current weight: 98.1 kg ?Admit weight: 101.7 kg  ? ?Medications: ? acidophilus  1 capsule Oral Daily  ? cholecalciferol  1,000 Units Oral Daily  ? docusate sodium  100 mg Oral BID  ? feeding supplement (GLUCERNA SHAKE)  237 mL Oral TID BM  ? furosemide  20 mg Oral Daily  ? insulin aspart  0-15 Units Subcutaneous Q4H  ? insulin aspart  8 Units Subcutaneous TID WC  ? insulin detemir  12 Units Subcutaneous QHS  ? magnesium gluconate  250 mg Oral QHS  ? multivitamin with minerals  1 tablet Oral Daily  ? omega-3 acid ethyl esters  1,000 mg Oral Daily  ? polyethylene glycol  17 g Oral Daily  ? senna-docusate  2 tablet Oral Q supper  ? Vitamin D (Ergocalciferol)  50,000 Units Oral Q7 days  ? ?Labs: ?Recent Labs  ?Lab 02/13/22 ?0125 02/14/22 ?0178294/17/23 ?0145 02/16/22 ?0207  ?NA 139 136  --  136  ?K 4.0 4.0  --  4.0  ?CL 105 102  --  100  ?CO2 24 27  --   26  ?BUN 16 19  --  19  ?CREATININE 0.81 0.96  --  0.82  ?CALCIUM 9.0 9.0  --  9.0  ?MG 1.8 1.9 2.0 1.9  ?PHOS 4.9* 4.3 4.3 4.4  ?GLUCOSE 122* 147*  --  152*  ?CBGs: 114-208 x24 hours ? ?Diet Order:   ?Diet Order   ? ?       ?  Diet Carb Modified Fluid consistency: Thin; Room service appropriate? Yes  Diet effective now       ?  ? ?  ?  ? ?  ? ?EDUCATION NEEDS:  ?No education needs have been identified at this time ? ?Skin:  Skin Assessment: Skin Integrity Issues: ?Skin Integrity Issues:: Incisions ?Incisions: L hip ? ?Last BM:  4/17 type 2 ? ?Height:  ?Ht Readings from Last 1 Encounters:  ?02/08/22 '5\' 9"'$  (1.753 m)  ? ?Weight:  ?Wt Readings from Last 1 Encounters:  ?02/16/22 98.1 kg  ? ?Ideal Body Weight:  72.7 kg ? ?BMI:  Body mass index is 31.94 kg/m?. ? ?Estimated Nutritional Needs:  ?Kcal:  2100-2300 ?Protein:  105-115 grams ?Fluid:  >2L ? ? ? ?AmTheone Stanley MS, RD, LDN (she/her/hers) ?RD pager number and weekend/on-call pager number located in [place]?

## 2022-02-17 DIAGNOSIS — S72002D Fracture of unspecified part of neck of left femur, subsequent encounter for closed fracture with routine healing: Secondary | ICD-10-CM

## 2022-02-17 LAB — COMPREHENSIVE METABOLIC PANEL
ALT: 8 U/L (ref 0–44)
AST: 18 U/L (ref 15–41)
Albumin: 3.1 g/dL — ABNORMAL LOW (ref 3.5–5.0)
Alkaline Phosphatase: 83 U/L (ref 38–126)
Anion gap: 9 (ref 5–15)
BUN: 19 mg/dL (ref 8–23)
CO2: 25 mmol/L (ref 22–32)
Calcium: 9.5 mg/dL (ref 8.9–10.3)
Chloride: 103 mmol/L (ref 98–111)
Creatinine, Ser: 0.89 mg/dL (ref 0.61–1.24)
GFR, Estimated: >60 mL/min (ref >60)
Glucose, Bld: 132 mg/dL — ABNORMAL HIGH (ref 70–99)
Potassium: 3.9 mmol/L (ref 3.5–5.1)
Sodium: 137 mmol/L (ref 135–145)
Total Bilirubin: 0.8 mg/dL (ref 0.3–1.2)
Total Protein: 6.5 g/dL (ref 6.5–8.1)

## 2022-02-17 LAB — GLUCOSE, CAPILLARY
Glucose-Capillary: 118 mg/dL — ABNORMAL HIGH (ref 70–99)
Glucose-Capillary: 125 mg/dL — ABNORMAL HIGH (ref 70–99)
Glucose-Capillary: 129 mg/dL — ABNORMAL HIGH (ref 70–99)
Glucose-Capillary: 161 mg/dL — ABNORMAL HIGH (ref 70–99)
Glucose-Capillary: 177 mg/dL — ABNORMAL HIGH (ref 70–99)
Glucose-Capillary: 187 mg/dL — ABNORMAL HIGH (ref 70–99)

## 2022-02-17 LAB — CBC WITH DIFFERENTIAL/PLATELET
Abs Immature Granulocytes: 0.04 10*3/uL (ref 0.00–0.07)
Basophils Absolute: 0.1 10*3/uL (ref 0.0–0.1)
Basophils Relative: 1 %
Eosinophils Absolute: 0.6 10*3/uL — ABNORMAL HIGH (ref 0.0–0.5)
Eosinophils Relative: 6 %
HCT: 37.4 % — ABNORMAL LOW (ref 39.0–52.0)
Hemoglobin: 12.2 g/dL — ABNORMAL LOW (ref 13.0–17.0)
Immature Granulocytes: 0 %
Lymphocytes Relative: 23 %
Lymphs Abs: 2.5 10*3/uL (ref 0.7–4.0)
MCH: 29.6 pg (ref 26.0–34.0)
MCHC: 32.6 g/dL (ref 30.0–36.0)
MCV: 90.8 fL (ref 80.0–100.0)
Monocytes Absolute: 1.1 10*3/uL — ABNORMAL HIGH (ref 0.1–1.0)
Monocytes Relative: 10 %
Neutro Abs: 6.3 10*3/uL (ref 1.7–7.7)
Neutrophils Relative %: 60 %
Platelets: 322 10*3/uL (ref 150–400)
RBC: 4.12 MIL/uL — ABNORMAL LOW (ref 4.22–5.81)
RDW: 13.4 % (ref 11.5–15.5)
WBC: 10.5 10*3/uL (ref 4.0–10.5)
nRBC: 0 % (ref 0.0–0.2)

## 2022-02-17 LAB — LIPID PANEL
Cholesterol: 116 mg/dL (ref 0–200)
HDL: 33 mg/dL — ABNORMAL LOW (ref >40)
LDL Cholesterol: 56 mg/dL (ref 0–99)
Total CHOL/HDL Ratio: 3.5 RATIO
Triglycerides: 134 mg/dL (ref <150)
VLDL: 27 mg/dL (ref 0–40)

## 2022-02-17 LAB — MAGNESIUM: Magnesium: 2 mg/dL (ref 1.7–2.4)

## 2022-02-17 LAB — PHOSPHORUS: Phosphorus: 5.1 mg/dL — ABNORMAL HIGH (ref 2.5–4.6)

## 2022-02-17 MED ORDER — DOCUSATE SODIUM 100 MG PO CAPS: 100.0000 mg | ORAL_CAPSULE | Freq: Two times a day (BID) | ORAL | 0 refills | Status: DC

## 2022-02-17 MED ORDER — GLUCERNA SHAKE PO LIQD
237.0000 mL | Freq: Three times a day (TID) | ORAL | 2 refills | Status: AC
Start: 2022-02-17 — End: 2022-05-18

## 2022-02-17 NOTE — Discharge Summary (Signed)
?Physician Discharge Summary ?  ?Patient: Jordan [name] MRN: [id number] DOB: 1941/09/19  ?Admit date:     02/08/2022  ?Discharge date:   ?Discharge Physician: Hosie Poisson  ? ?PCP: [name], MD  ? ?Recommendations at discharge:  ?Please follow up with orthopedics as recommended.  ?Please follow up with PCP in one week.  ?Please follow up with CBC and BMP in one week.  ? ?Discharge Diagnoses: ?Principal Problem: ?  Closed left hip fracture ([place]) secondary to fall at home ?Active Problems: ?  Leukocytosis ?  Chronic diastolic heart failure ([place]) ?  History of CVA (cerebrovascular accident) ?  Essential hypertension ?  HLD (hyperlipidemia) ?  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  Coronary atherosclerosis ?  Controlled type 2 diabetes mellitus with hyperglycemia, without long-term current use of insulin ([place]) ?  Fall at home, initial encounter ?  Parkinson disease ([place]) ?  Paroxysmal atrial fibrillation (H[facility]) ? ? ?Hospital Course: ? ?81 y.o. WM PMHx HTN, Diastolic CHF, CAD, bradycardia  s/p PPM, type 2 diabetes mellitus, Parkinson's disease, gout, Narcolepsy, and OSA  presents after having a fall at home. He was found to have mildly displaced intertrochanteric fracture of the left femur. Orthopedics consulted and he underwent surgical repair. Therapy eval recommending SNF.  ?Plan for discharge when bed available.  ? ? ? ? ?Assessment and Plan: ?* Closed left hip fracture ([place]) secondary to fall at home ?Acute.  Patient presents after having fallen at home found to have acute mildly displaced intertrochanteric left femur fracture on imaging.  Underwent surgical repair by orthopedics.  ?Mobilize touch down weightbearing for transfers until first post op viist.  ?-- pain control.  ?-Bowel regimen ?-Appreciate orthopedic consultative services, will follow-up for any further recommendations ? ?Chronic diastolic heart failure ([place]) ?Patient with a diastolic CHF last EF noted to be 60-65% with indeterminate diastolic dysfunction  on 03/08/997. ?He appears compensated.  ? ? ?Leukocytosis ?Resolved. Probably reactive.  ? ?History of CVA (cerebrovascular accident) ?-Continue Plavix and statin ? ?Essential hypertension ?On admission blood pressure appears relatively controlled.  Home medication regimen includes metoprolol 25 mg twice daily and furosemide 20 mg daily. ?-Continue home regimen as tolerated ? ?Paroxysmal atrial fibrillation (H[facility]) ?Patient was noted during last hospitalization and being in atrial fibrillation for 4 hours, but thought to be in the setting of infection.  No other events were reported.  He was not started on anticoagulation at that time. ?-Continue to monitor ? ?Parkinson disease ([place]) ?During prior hospitalization patient had noted to have hallucinations. ?-Delirium precautions ?-Continue Sinemet, amantadine, and Seroquel   ? ?Controlled type 2 diabetes mellitus with hyperglycemia, without long-term current use of insulin ([place]) ?Patient well controlled.  Last hemoglobin A1c 6.1 on 3/2.  On admission glucose 175.  Home medication regimen includes metformin 1000 mg daily. ?CBG (last 3)  ?Recent Labs  ?  02/17/22 ?0828 02/17/22 ?1128 02/17/22 ?1626  ?GLUCAP 129* 118* 187*  ?[name]  ?Resume home meds on discharge.  ? ? 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 ?HLD (hyperlipidemia) ?Home regimen includes atorvastatin 20 mg daily, and Lovaza 1000 mg daily. ?-Continue atorvastatin and Lovaza ? ? ? ? ?Consultants: orthopedics.  ?Procedures performed: surgical repair of the femur.   ?Disposition: Skilled nursing facility ?Diet recommendation:  ?Carb modified diet ?DISCHARGE MEDICATION: ?Allergies as of 02/17/2022   ?No Known Allergies ?  ? ?  ?Medication List  ?  ? ?TAKE these medications   ? ?acetaminophen 325 MG tablet ?Commonly known as:  TYLENOL ?Take 1-2 tablets (325-650 mg total) by mouth every 4 (four) hours as needed for mild pain. ?What changed: how much to take ?  ?amantadine 100 MG capsule ?Commonly known as:  SYMMETREL ?Take 1 capsule (100 mg total) by mouth daily. ?  ?aspirin 81 MG chewable tablet ?Chew 1 tablet (81 mg total) by mouth daily. ?  ?atorvastatin 40 MG tablet ?Commonly known as: LIPITOR ?Take 0.5 tablets (20 mg total) by mouth daily. ?  ?BENEFIBER PO ?Take 10 mLs by mouth daily. ?  ?bifidobacterium infantis capsule ?Take 1 capsule by mouth daily. ?  ?Carbidopa-Levodopa ER 25-100 MG tablet controlled release ?Commonly known as: SINEMET CR ?Take 1 tablet by mouth at bedtime. ?  ?carbidopa-levodopa 25-100 MG tablet ?Commonly known as: Sinemet ?Take 1 tablet by mouth 3 (three) times daily. ?  ?cholecalciferol 25 MCG (1000 UNIT) tablet ?Commonly known as: VITAMIN D3 ?Take 1,000 Units by mouth daily. ?  ?clopidogrel 75 MG tablet ?Commonly known as: Plavix ?Take 1 tablet (75 mg total) by mouth daily. ?  ?docusate sodium 100 MG capsule ?Commonly known as: COLACE ?Take 1 capsule (100 mg total) by mouth 2 (two) times daily. ?  ?feeding supplement (GLUCERNA SHAKE) Liqd ?Take 237 mLs by mouth 3 (three) times daily between meals. ?  ?furosemide 20 MG tablet ?Commonly known as: LASIX ?USE AS NEEDED FOR UP TO 5 DAYS IF SWELLING IN LEGS RECURS. CONTACT CARDIOLOGY IS SWELLING DOES NOT IMPROVE OR GETS WORSE. ?  ?HM SALONPAS PAIN RELIEF EX ?Apply 1 patch topically daily as needed (pain). ?  ?lidocaine 5 % ?Commonly known as: LIDODERM ?Place 1 patch onto the skin daily as needed. Purchase over the counter. On for 12 hours and off for 12 hours ?  ?magnesium gluconate 500 MG tablet ?Commonly known as: MAGONATE ?Take 0.5 tablets (250 mg total) by mouth at bedtime. ?  ?Melatonin 10 MG Tabs ?Take 10 mg by mouth at bedtime. ?  ?metFORMIN 1000 MG tablet ?Commonly known as: GLUCOPHAGE ?Take 1 tablet (1,000 mg total) by mouth daily with breakfast. ?  ?metoprolol tartrate 25 MG tablet ?Commonly known as: LOPRESSOR ?Take 1 tablet (25 mg total) by mouth 2 (two) times daily. ?  ?multivitamin with minerals Tabs tablet ?Take 1 tablet by mouth  daily. ?  ?Nitrostat 0.4 MG SL tablet ?Generic drug: nitroGLYCERIN ?PLACE 1 TABLET (0.4 MG TOTAL) UNDER THE TONGUE EVERY 5 (FIVE) MINUTES AS NEEDED FOR CHEST PAIN. ?What changed: how much to take ?  ?Omega 3 1200 MG Caps ?Take 1,200 mg by mouth daily. ?  ?oxyCODONE 5 MG immediate release tablet ?Commonly known as: Oxy IR/ROXICODONE ?Take 1 tablet (5 mg total) by mouth every 6 (six) hours as needed for moderate pain (pain score 4-6). ?  ?polyethylene glycol 17 g packet ?Commonly known as: MIRALAX / GLYCOLAX ?Take 17 g by mouth daily. ?  ?polyvinyl alcohol 1.4 % ophthalmic solution ?Commonly known as: LIQUIFILM TEARS ?Place 1 drop into both eyes as needed for dry eyes. ?  ?PRESCRIPTION MEDICATION ?See admin instructions. CPAP- At bedtime ?  ?QUEtiapine 25 MG tablet ?Commonly known as: SEROQUEL ?Take 1/2 tablets (12.5 mg total) by mouth at bedtime. ?  ?senna-docusate 8.6-50 MG tablet ?Commonly known as: Senokot-S ?Take 2 tablets by mouth daily with supper. ?  ?Vitamin D (Ergocalciferol) 1.25 MG (50000 UNIT) Caps capsule ?Commonly known as: DRISDOL ?Take 1 capsule (50,000 Units total) by mouth every 7 (seven) days. ?  ? ?  ? ? Follow-up Information   ? ?  [name], MD Follow up.   ?Specialty: Family Medicine ?Contact information: ?[address] ?[address] ?[place] [address] ?[phone] ? ? ?  ?  ? ?  ?  ? ?  ? ?Discharge Exam: ?Filed Weights  ? 02/13/22 0546 02/15/22 0500 02/16/22 0500  ?Weight: 99.4 kg 101.6 kg 98.1 kg  ? ?General exam: Appears calm and comfortable  ?Respiratory system: Clear to auscultation. Respiratory effort normal. ?Cardiovascular system: S1 & S2 heard, RRR. No JVD, murmurs, rubs, gallops or clicks. No pedal edema. ?Gastrointestinal system: Abdomen is nondistended, soft and nontender. No organomegaly or masses felt. Normal bowel sounds heard. ?Central nervous system: Alert and oriented. No focal neurological deficits. ?Extremities: Symmetric 5 x 5 power. ?Skin: No rashes, lesions or  ulcers ?Psychiatry: Judgement and insight appear normal. Mood & affect appropriate.  ? ? ?Condition at discharge: fair ? ?The results of significant diagnostics from this hospitalization (including imagin

## 2022-02-17 NOTE — Progress Notes (Signed)
Inpatient Rehab Admissions Coordinator:  ? ?Received update from appeals dept at [facility].  Case was reviewed and denial upheld.  I will not be able to admit this patient to CIR.  I let TOC team and MD know.  Will sign off.   ? ?Shann Medal, PT, DPT ?Admissions Coordinator ?[phone] ?02/17/22  ?2:40 PM ? ?

## 2022-02-17 NOTE — Progress Notes (Signed)
Pt self administers home CPAP when he is ready for rest. ?

## 2022-02-17 NOTE — TOC Progression Note (Addendum)
Transition of Care (TOC) - Progression Note  ? ? ?Patient Details  ?Name: Jordan [name] ?MRN: [id number] ?Date of Birth: May 15, 1941 ? ?Transition of Care (TOC) CM/SW Contact  ?Joanne Chars, LCSW ?Phone Number: ?02/17/2022, 9:56 AM ? ?Clinical Narrative:   CSW received call from [name]: 2 pts and 2 staff tested positive for covid yesterday, she is just providing full disclosure due to pt impending admission.  Pts are isolated, this pt will have private room.  CSW called pt wife [name] and informed her, she wanted to think on this for a few minutes.   ? ?7915: CIR appeal denied, CSW spoke with wife.  She does want to still move forward with [facility] despite covid.  CSW spoke with Neoma Laming at [facility] and they would need pt to be in the building by 5pm, which is unlikely achievable.  MD informed.  Wife informed. ? ? ? ?Expected Discharge Plan: [place] ?Barriers to Discharge: Continued Medical Work up, SNF Pending bed offer ? ?Expected Discharge Plan and Services ?Expected Discharge Plan: [place] ?In-house Referral: Clinical Social Work ?  ?Post Acute Care Choice: [place] ?Living arrangements for the past 2 months: [place] ?Expected Discharge Date: 02/12/22               ?  ?  ?  ?  ?  ?  ?  ?  ?  ?  ? ? ?Social Determinants of Health (SDOH) Interventions ?  ? ?Readmission Risk Interventions ?   ? View : No data to display.  ?  ?  ?  ? ? ?

## 2022-02-17 NOTE — Progress Notes (Signed)
Physical Therapy Treatment ?Patient Details ?Name: Jordan [name] ?MRN: [id number] ?DOB: 1941-06-20 ?Today's Date: 02/17/2022 ? ? ?History of Present Illness Pt is an 81 y.o. male admitted 02/08/22 after fall onto L-hip sustaining L intertrochanteric hip fx. S/p L hip IMN 4/10. Of note, recent admission to 12/18/21 with worsening hallucinations, attributed to pt's Parkinson's disease; d/c to CIR (home 01/17/22), then outpatient neuro eval started 4/4. Other PMH includes OSA, DM2, HTN, CHF, CVA, L2 compression fx (s/p L2 kyphoplasty 12/29/21), frequent falls. ? ?  ?PT Comments  ? ? Patient is progressing towards physical therapy goals. Patient required min A with bed mobility and min A to mod Ax2 for transfers. Pt had difficulty maintaining his 50% WB precaution in standing due to generalized weakness and decreased activity tolerance. Pt very tangential and difficulty dual tasking with mobility tasks. PT to recommend SNF upon D/C since pt denied for acute inpatient rehab placement. Acute PT will continue to follow to maximize pt's safety and independence with functional mobility. ? ?   ?Recommendations for follow up therapy are one component of a multi-disciplinary discharge planning process, led by the attending physician.  Recommendations may be updated based on patient status, additional functional criteria and insurance authorization. ? ?Follow Up Recommendations ? Skilled nursing-short term rehab (<3 hours/day) ?  ?  ?Assistance Recommended at Discharge Frequent or constant Supervision/Assistance  ?Patient can return home with the following Two people to help with walking and/or transfers;A lot of help with bathing/dressing/bathroom;Assist for transportation;Help with stairs or ramp for entrance;Assistance with cooking/housework ?  ?Equipment Recommendations ? None recommended by PT (TBD at next venue of care)  ?  ?Recommendations for Other Services   ? ? ?  ?Precautions / Restrictions Precautions ?Precautions:  Fall ?Restrictions ?Weight Bearing Restrictions: Yes ?LLE Weight Bearing: Partial weight bearing ?LLE Partial Weight Bearing Percentage or Pounds: 50  ?  ? ?Mobility ? Bed Mobility ?Overal bed mobility: Needs Assistance ?Bed Mobility: Supine to Sit ?  ?  ?Supine to sit: Min assist ?  ?  ?General bed mobility comments: required min A for sitting at EOB for lifting trunk and cues for LE management. ?  ? ?Transfers ?Overall transfer level: Needs assistance ?Equipment used: Rolling walker (2 wheels) ?Transfers: Sit to/from Stand, Bed to chair/wheelchair/BSC ?Sit to Stand: Min assist, Mod assist, +2 physical assistance, +2 safety/equipment ?Stand pivot transfers: Mod assist, +2 physical assistance, +2 safety/equipment ?  ?  ?  ?  ?General transfer comment: required min A for lift assist for first attempt of sit<>stand, however, had difficulty maintaining 50% WB precaution with L knee buckling in stance phase; required mod Ax2 due to pt fatigue upon 2nd transfer attempt. ?  ? ?Ambulation/Gait ?  ?  ?  ?  ?  ?  ?  ?General Gait Details: able to take R side steps at EOB with L knee buckling. Sat back down and performed stand pivot to chair for therex. ? ? ?Stairs ?  ?  ?  ?  ?  ? ? ?Wheelchair Mobility ?  ? ?Modified Rankin (Stroke Patients Only) ?  ? ? ?  ?Balance Overall balance assessment: Needs assistance ?Sitting-balance support: Feet supported, Single extremity supported ?Sitting balance-Leahy Scale: Fair ?Sitting balance - Comments: single UE support required with supervision for safety at EOB ?  ?Standing balance support: Bilateral upper extremity supported, During functional activity, Reliant on assistive device for balance ?Standing balance-Leahy Scale: Poor ?Standing balance comment: reliant on BUE support using RW and mod Ax2  for transfer to recliner ?  ?  ?  ?  ?  ?  ?  ?  ?  ?  ?  ?  ? ?  ?Cognition Arousal/Alertness: Awake/alert ?Behavior During Therapy: [facility] for tasks assessed/performed ?Overall Cognitive  Status: History of cognitive impairments - at baseline ?  ?  ?  ?  ?  ?  ?  ?  ?  ?  ?  ?  ?  ?  ?  ?  ?General Comments: difficulty dual tasking: talking while performing mobility. Pt tangential during session. ?  ?  ? ?  ?Exercises General Exercises - Lower Extremity ?Ankle Circles/Pumps: 20 reps, Both, Seated ?Quad Sets: Left, 10 reps, Seated ? ?  ?General Comments   ?  ?  ? ?Pertinent Vitals/Pain Pain Assessment ?Pain Assessment: Faces ?Faces Pain Scale: Hurts a little bit ?Pain Location: LLE with WB and movement ?Pain Descriptors / Indicators: Discomfort ?Pain Intervention(s): Monitored during session  ? ? ?Home Living   ?  ?  ?  ?  ?  ?  ?  ?  ?  ?   ?  ?Prior Function    ?  ?  ?   ? ?PT Goals (current goals can now be found in the care plan section) Acute Rehab PT Goals ?Patient Stated Goal: to feel better ?PT Goal Formulation: With patient ?Time For Goal Achievement: 02/23/22 ?Potential to Achieve Goals: Good ?Progress towards PT goals: Progressing toward goals ? ?  ?Frequency ? ? ? Min 3X/week ? ? ? ?  ?PT Plan Discharge plan needs to be updated  ? ? ?Co-evaluation   ?  ?  ?  ?  ? ?  ?AM-PAC PT "6 Clicks" Mobility   ?Outcome Measure ? Help needed turning from your back to your side while in a flat bed without using bedrails?: A Little ?Help needed moving from lying on your back to sitting on the side of a flat bed without using bedrails?: A Little ?Help needed moving to and from a bed to a chair (including a wheelchair)?: Total ?Help needed standing up from a chair using your arms (e.g., wheelchair or bedside chair)?: Total ?Help needed to walk in hospital room?: Total ?Help needed climbing 3-5 steps with a railing? : Total ?6 Click Score: 10 ? ?  ?End of Session Equipment Utilized During Treatment: Gait belt ?Activity Tolerance: Patient tolerated treatment well ?Patient left: in chair;with chair alarm set;with call bell/phone within reach ?Nurse Communication: Mobility status ?PT Visit Diagnosis: Other  abnormalities of gait and mobility (R26.89);Muscle weakness (generalized) (M62.81);Pain;Repeated falls (R29.6) ?Pain - Right/Left: Left ?Pain - part of body: Hip ?  ? ? ?Time: 9030-0923 ?PT Time Calculation (min) (ACUTE ONLY): 32 min ? ?Charges:  $Therapeutic Activity: 23-37 mins          ?          ? ?Jonne Ply, SPT ? ? ?Jonne Ply ?02/17/2022, 4:59 PM ? ?

## 2022-02-18 DIAGNOSIS — Z743 Need for continuous supervision: Secondary | ICD-10-CM | POA: Diagnosis not present

## 2022-02-18 DIAGNOSIS — E1165 Type 2 diabetes mellitus with hyperglycemia: Secondary | ICD-10-CM | POA: Diagnosis not present

## 2022-02-18 DIAGNOSIS — E785 Hyperlipidemia, unspecified: Secondary | ICD-10-CM | POA: Diagnosis not present

## 2022-02-18 DIAGNOSIS — G20 Parkinson's disease: Secondary | ICD-10-CM | POA: Diagnosis not present

## 2022-02-18 DIAGNOSIS — G4733 Obstructive sleep apnea (adult) (pediatric): Secondary | ICD-10-CM | POA: Diagnosis not present

## 2022-02-18 DIAGNOSIS — R279 Unspecified lack of coordination: Secondary | ICD-10-CM | POA: Diagnosis not present

## 2022-02-18 DIAGNOSIS — I7090 Unspecified atherosclerosis: Secondary | ICD-10-CM | POA: Diagnosis not present

## 2022-02-18 DIAGNOSIS — M6281 Muscle weakness (generalized): Secondary | ICD-10-CM | POA: Diagnosis not present

## 2022-02-18 DIAGNOSIS — W19XXXA Unspecified fall, initial encounter: Secondary | ICD-10-CM | POA: Diagnosis not present

## 2022-02-18 DIAGNOSIS — I10 Essential (primary) hypertension: Secondary | ICD-10-CM | POA: Diagnosis not present

## 2022-02-18 DIAGNOSIS — R2681 Unsteadiness on feet: Secondary | ICD-10-CM | POA: Diagnosis not present

## 2022-02-18 DIAGNOSIS — G8918 Other acute postprocedural pain: Secondary | ICD-10-CM | POA: Diagnosis not present

## 2022-02-18 DIAGNOSIS — M85832 Other specified disorders of bone density and structure, left forearm: Secondary | ICD-10-CM | POA: Diagnosis not present

## 2022-02-18 DIAGNOSIS — E1169 Type 2 diabetes mellitus with other specified complication: Secondary | ICD-10-CM | POA: Diagnosis not present

## 2022-02-18 DIAGNOSIS — M810 Age-related osteoporosis without current pathological fracture: Secondary | ICD-10-CM | POA: Diagnosis not present

## 2022-02-18 DIAGNOSIS — I639 Cerebral infarction, unspecified: Secondary | ICD-10-CM | POA: Diagnosis not present

## 2022-02-18 DIAGNOSIS — R102 Pelvic and perineal pain: Secondary | ICD-10-CM | POA: Diagnosis not present

## 2022-02-18 DIAGNOSIS — I5032 Chronic diastolic (congestive) heart failure: Secondary | ICD-10-CM | POA: Diagnosis not present

## 2022-02-18 DIAGNOSIS — Z794 Long term (current) use of insulin: Secondary | ICD-10-CM | POA: Diagnosis not present

## 2022-02-18 DIAGNOSIS — S72002D Fracture of unspecified part of neck of left femur, subsequent encounter for closed fracture with routine healing: Secondary | ICD-10-CM | POA: Diagnosis not present

## 2022-02-18 DIAGNOSIS — I480 Paroxysmal atrial fibrillation: Secondary | ICD-10-CM | POA: Diagnosis not present

## 2022-02-18 LAB — COMPREHENSIVE METABOLIC PANEL
ALT: 14 U/L (ref 0–44)
AST: 20 U/L (ref 15–41)
Albumin: 2.9 g/dL — ABNORMAL LOW (ref 3.5–5.0)
Alkaline Phosphatase: 84 U/L (ref 38–126)
Anion gap: 8 (ref 5–15)
BUN: 23 mg/dL (ref 8–23)
CO2: 25 mmol/L (ref 22–32)
Calcium: 9.2 mg/dL (ref 8.9–10.3)
Chloride: 103 mmol/L (ref 98–111)
Creatinine, Ser: 1.05 mg/dL (ref 0.61–1.24)
GFR, Estimated: >60 mL/min (ref >60)
Glucose, Bld: 186 mg/dL — ABNORMAL HIGH (ref 70–99)
Potassium: 4.1 mmol/L (ref 3.5–5.1)
Sodium: 136 mmol/L (ref 135–145)
Total Bilirubin: 0.4 mg/dL (ref 0.3–1.2)
Total Protein: 6.1 g/dL — ABNORMAL LOW (ref 6.5–8.1)

## 2022-02-18 LAB — CBC WITH DIFFERENTIAL/PLATELET
Abs Immature Granulocytes: 0.04 10*3/uL (ref 0.00–0.07)
Basophils Absolute: 0.1 10*3/uL (ref 0.0–0.1)
Basophils Relative: 1 %
Eosinophils Absolute: 0.6 10*3/uL — ABNORMAL HIGH (ref 0.0–0.5)
Eosinophils Relative: 6 %
HCT: 36.1 % — ABNORMAL LOW (ref 39.0–52.0)
Hemoglobin: 11.9 g/dL — ABNORMAL LOW (ref 13.0–17.0)
Immature Granulocytes: 0 %
Lymphocytes Relative: 26 %
Lymphs Abs: 2.7 10*3/uL (ref 0.7–4.0)
MCH: 29.8 pg (ref 26.0–34.0)
MCHC: 33 g/dL (ref 30.0–36.0)
MCV: 90.3 fL (ref 80.0–100.0)
Monocytes Absolute: 1.1 10*3/uL — ABNORMAL HIGH (ref 0.1–1.0)
Monocytes Relative: 11 %
Neutro Abs: 6 10*3/uL (ref 1.7–7.7)
Neutrophils Relative %: 56 %
Platelets: 300 10*3/uL (ref 150–400)
RBC: 4 MIL/uL — ABNORMAL LOW (ref 4.22–5.81)
RDW: 13.3 % (ref 11.5–15.5)
WBC: 10.5 10*3/uL (ref 4.0–10.5)
nRBC: 0 % (ref 0.0–0.2)

## 2022-02-18 LAB — MAGNESIUM: Magnesium: 2 mg/dL (ref 1.7–2.4)

## 2022-02-18 LAB — GLUCOSE, CAPILLARY
Glucose-Capillary: 219 mg/dL — ABNORMAL HIGH (ref 70–99)
Glucose-Capillary: 243 mg/dL — ABNORMAL HIGH (ref 70–99)

## 2022-02-18 LAB — PHOSPHORUS: Phosphorus: 5.1 mg/dL — ABNORMAL HIGH (ref 2.5–4.6)

## 2022-02-18 NOTE — Care Management Important Message (Signed)
Important Message ? ?Patient Details  ?Name: Jordan [name] ?MRN: [id number] ?Date of Birth: 1941/04/09 ? ? ?Medicare Important Message Given:  Yes ? ?Patient left prior to IM delivery will mail to the patient home address.  ? ? ?Fancy Dunkley ?02/18/2022, 1:49 PM ?

## 2022-02-18 NOTE — Discharge Summary (Signed)
?Physician Discharge Summary ?  ?Patient: Jordan [name] MRN: [id number] DOB: August 30, 1941  ?Admit date:     02/08/2022  ?Discharge date: 02/18/22  ?Discharge Physician: Hosie Poisson  ? ?PCP: [name], MD  ? ?Recommendations at discharge:  ?Please follow up with orthopedics as recommended.  ?Please follow up with PCP in one week.  ?Please follow up with CBC and BMP in one week.  ? ?Discharge Diagnoses: ?Principal Problem: ?  Closed left hip fracture ([place]) secondary to fall at home ?Active Problems: ?  Leukocytosis ?  Chronic diastolic heart failure ([place]) ?  History of CVA (cerebrovascular accident) ?  Essential hypertension ?  HLD (hyperlipidemia) ?  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  Coronary atherosclerosis ?  Controlled type 2 diabetes mellitus with hyperglycemia, without long-term current use of insulin ([place]) ?  Fall at home, initial encounter ?  Parkinson disease ([place]) ?  Paroxysmal atrial fibrillation (HCC) ? ? ?Hospital Course: ? ?81 y.o. WM PMHx HTN, Diastolic CHF, CAD, bradycardia  s/p PPM, type 2 diabetes mellitus, Parkinson's disease, gout, Narcolepsy, and OSA  presents after having a fall at home. He was found to have mildly displaced intertrochanteric fracture of the left femur. Orthopedics consulted and he underwent surgical repair. Therapy eval recommending SNF.  ?Plan for discharge when bed available.  ? ? ? ? ?Assessment and Plan: ?* Closed left hip fracture ([place]) secondary to fall at home ?Acute.  Patient presents after having fallen at home found to have acute mildly displaced intertrochanteric left femur fracture on imaging.  Underwent surgical repair by orthopedics.  ?Mobilize touch down weightbearing for transfers until first post op viist.  ?-- pain control.  ?-Bowel regimen ?-Appreciate orthopedic consultative services, will follow-up for any further recommendations ? ?Chronic diastolic heart failure ([place]) ?Patient with a diastolic CHF last EF noted to be 60-65% with indeterminate diastolic  dysfunction on 12/03/9796. ?He appears compensated.  ? ? ?Leukocytosis ?Resolved. Probably reactive.  ? ?History of CVA (cerebrovascular accident) ?-Continue Plavix and statin ? ?Essential hypertension ?On admission blood pressure appears relatively controlled.  Home medication regimen includes metoprolol 25 mg twice daily and furosemide 20 mg daily. ?-Continue home regimen as tolerated ? ?Paroxysmal atrial fibrillation (HCC) ?Patient was noted during last hospitalization and being in atrial fibrillation for 4 hours, but thought to be in the setting of infection.  No other events were reported.  He was not started on anticoagulation at that time. ?-Continue to monitor ? ?Parkinson disease ([place]) ?During prior hospitalization patient had noted to have hallucinations. ?-Delirium precautions ?-Continue Sinemet, amantadine, and Seroquel   ? ?Controlled type 2 diabetes mellitus with hyperglycemia, without long-term current use of insulin ([place]) ?Patient well controlled.  Last hemoglobin A1c 6.1 on 3/2.  On admission glucose 175.  Home medication regimen includes metformin 1000 mg daily. ?CBG (last 3)  ?Recent Labs  ?  02/17/22 ?2345 02/18/22 ?0431 02/18/22 ?0759  ?GLUCAP 161* 219* 243*  ? ?.  ?Resume home meds on discharge.  ? ? 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 ?HLD (hyperlipidemia) ?Home regimen includes atorvastatin 20 mg daily, and Lovaza 1000 mg daily. ?-Continue atorvastatin and Lovaza ? ? ? ? ?Consultants: orthopedics.  ?Procedures performed: surgical repair of the femur.   ?Disposition: Skilled nursing facility ?Diet recommendation:  ?Carb modified diet ?DISCHARGE MEDICATION: ?Allergies as of 02/18/2022   ?No Known Allergies ?  ? ?  ?Medication List  ?  ? ?TAKE these medications   ? ?acetaminophen 325 MG tablet ?Commonly known  as: TYLENOL ?Take 1-2 tablets (325-650 mg total) by mouth every 4 (four) hours as needed for mild pain. ?What changed: how much to take ?  ?amantadine 100 MG capsule ?Commonly  known as: SYMMETREL ?Take 1 capsule (100 mg total) by mouth daily. ?  ?aspirin 81 MG chewable tablet ?Chew 1 tablet (81 mg total) by mouth daily. ?  ?atorvastatin 40 MG tablet ?Commonly known as: LIPITOR ?Take 0.5 tablets (20 mg total) by mouth daily. ?  ?BENEFIBER PO ?Take 10 mLs by mouth daily. ?  ?bifidobacterium infantis capsule ?Take 1 capsule by mouth daily. ?  ?Carbidopa-Levodopa ER 25-100 MG tablet controlled release ?Commonly known as: SINEMET CR ?Take 1 tablet by mouth at bedtime. ?  ?carbidopa-levodopa 25-100 MG tablet ?Commonly known as: Sinemet ?Take 1 tablet by mouth 3 (three) times daily. ?  ?cholecalciferol 25 MCG (1000 UNIT) tablet ?Commonly known as: VITAMIN D3 ?Take 1,000 Units by mouth daily. ?  ?clopidogrel 75 MG tablet ?Commonly known as: Plavix ?Take 1 tablet (75 mg total) by mouth daily. ?  ?docusate sodium 100 MG capsule ?Commonly known as: COLACE ?Take 1 capsule (100 mg total) by mouth 2 (two) times daily. ?  ?feeding supplement (GLUCERNA SHAKE) Liqd ?Take 237 mLs by mouth 3 (three) times daily between meals. ?  ?furosemide 20 MG tablet ?Commonly known as: LASIX ?USE AS NEEDED FOR UP TO 5 DAYS IF SWELLING IN LEGS RECURS. CONTACT CARDIOLOGY IS SWELLING DOES NOT IMPROVE OR GETS WORSE. ?  ?HM SALONPAS PAIN RELIEF EX ?Apply 1 patch topically daily as needed (pain). ?  ?lidocaine 5 % ?Commonly known as: LIDODERM ?Place 1 patch onto the skin daily as needed. Purchase over the counter. On for 12 hours and off for 12 hours ?  ?magnesium gluconate 500 MG tablet ?Commonly known as: MAGONATE ?Take 0.5 tablets (250 mg total) by mouth at bedtime. ?  ?Melatonin 10 MG Tabs ?Take 10 mg by mouth at bedtime. ?  ?metFORMIN 1000 MG tablet ?Commonly known as: GLUCOPHAGE ?Take 1 tablet (1,000 mg total) by mouth daily with breakfast. ?  ?metoprolol tartrate 25 MG tablet ?Commonly known as: LOPRESSOR ?Take 1 tablet (25 mg total) by mouth 2 (two) times daily. ?  ?multivitamin with minerals Tabs tablet ?Take 1  tablet by mouth daily. ?  ?Nitrostat 0.4 MG SL tablet ?Generic drug: nitroGLYCERIN ?PLACE 1 TABLET (0.4 MG TOTAL) UNDER THE TONGUE EVERY 5 (FIVE) MINUTES AS NEEDED FOR CHEST PAIN. ?What changed: how much to take ?  ?Omega 3 1200 MG Caps ?Take 1,200 mg by mouth daily. ?  ?oxyCODONE 5 MG immediate release tablet ?Commonly known as: Oxy IR/ROXICODONE ?Take 1 tablet (5 mg total) by mouth every 6 (six) hours as needed for moderate pain (pain score 4-6). ?  ?polyethylene glycol 17 g packet ?Commonly known as: MIRALAX / GLYCOLAX ?Take 17 g by mouth daily. ?  ?polyvinyl alcohol 1.4 % ophthalmic solution ?Commonly known as: LIQUIFILM TEARS ?Place 1 drop into both eyes as needed for dry eyes. ?  ?PRESCRIPTION MEDICATION ?See admin instructions. CPAP- At bedtime ?  ?QUEtiapine 25 MG tablet ?Commonly known as: SEROQUEL ?Take 1/2 tablets (12.5 mg total) by mouth at bedtime. ?  ?senna-docusate 8.6-50 MG tablet ?Commonly known as: Senokot-S ?Take 2 tablets by mouth daily with supper. ?  ?Vitamin D (Ergocalciferol) 1.25 MG (50000 UNIT) Caps capsule ?Commonly known as: DRISDOL ?Take 1 capsule (50,000 Units total) by mouth every 7 (seven) days. ?  ? ?  ? ? Follow-up Information   ? ?  [name], MD Follow up.   ?Specialty: Family Medicine ?Contact information: ?[address] ?[address] ?[place] [address] ?[phone] ? ? ?  ?  ? ?  ?  ? ?  ? ?Discharge Exam: ?Filed Weights  ? 02/13/22 0546 02/15/22 0500 02/16/22 0500  ?Weight: 99.4 kg 101.6 kg 98.1 kg  ? ?General exam: Appears calm and comfortable  ?Respiratory system: Clear to auscultation. Respiratory effort normal. ?Cardiovascular system: S1 & S2 heard, RRR. No JVD, murmurs, rubs, gallops or clicks. No pedal edema. ?Gastrointestinal system: Abdomen is nondistended, soft and nontender. No organomegaly or masses felt. Normal bowel sounds heard. ?Central nervous system: Alert and oriented. No focal neurological deficits. ?Extremities: Symmetric 5 x 5 power. ?Skin: No  rashes, lesions or ulcers ?Psychiatry: Judgement and insight appear normal. Mood & affect appropriate.  ? ? ?Condition at discharge: fair ? ?The results of significant diagnostics from this hospitalization (includi

## 2022-02-18 NOTE — Progress Notes (Incomplete)
Patient discharged at this time via PTAR.  Wife at bedside.  Report called to [place] at [facility].  ?

## 2022-02-18 NOTE — TOC Transition Note (Signed)
Transition of Care (TOC) - CM/SW Discharge Note ? ? ?Patient Details  ?Name: Jordan [name] ?MRN: [id number] ?Date of Birth: 06/05/1941 ? ?Transition of Care [facility]) CM/SW Contact:  ?Joanne Chars, LCSW ?Phone Number: ?02/18/2022, 10:00 AM ? ? ?Clinical Narrative:   Pt discharging to [facility].  RN call [phone] for report.  ? ? ? ?Final next level of care: [place] ?Barriers to Discharge: Barriers Resolved ? ? ?Patient Goals and CMS Choice ?Patient states their goals for this hospitalization and ongoing recovery are:: "do what I used to do, get back to the [facility]" ?CMS Medicare.gov Compare Post Acute Care list provided to:: Patient ?Choice offered to / list presented to : Patient ? ?Discharge Placement ?  ?           ?Patient chooses bed at:  [facility]) ?Patient to be transferred to facility by: PTAR ?Name of family member notified: wife [name] ?Patient and family notified of of transfer: 02/18/22 ? ?Discharge Plan and Services ?In-house Referral: Clinical Social Work ?  ?Post Acute Care Choice: [place]          ?  ?  ?  ?  ?  ?  ?  ?  ?  ?  ? ?Social Determinants of Health (SDOH) Interventions ?  ? ? ?Readmission Risk Interventions ?   ? View : No data to display.  ?  ?  ?  ? ? ? ? ? ?

## 2022-02-22 ENCOUNTER — Ambulatory Visit: Payer: Medicare (Managed Care) | Admitting: Occupational Therapy

## 2022-02-22 ENCOUNTER — Ambulatory Visit: Payer: Medicare (Managed Care) | Admitting: Speech Pathology

## 2022-02-22 ENCOUNTER — Ambulatory Visit: Payer: Medicare (Managed Care)

## 2022-02-23 ENCOUNTER — Encounter: Payer: Self-pay | Admitting: Internal Medicine

## 2022-02-23 ENCOUNTER — Telehealth: Payer: Self-pay

## 2022-02-23 NOTE — Telephone Encounter (Signed)
Received Surgical Clearance from [facility] for pt. Asking if plavix can be held prior to this surgical procedure (not scheduled yet). ? ?Will fwd to NP to review and sign if appropriate.  ?

## 2022-02-23 NOTE — Telephone Encounter (Signed)
Patient cleared to pursue oral surgery from a stroke standpoint only.  Okay to hold Plavix for 3 to 5 days IF needed with small but acceptable risk of recurrent stroke while off therapy and recommend restarting immediately after.  He has other multiple comorbidities including sleep apnea and cardiac disease and would recommend further clearance from pulmonology and cardiology as they plan on using general anesthesia.  Clearance form completed, signed and placed in outbox.  Thank you. ?

## 2022-02-23 NOTE — Assessment & Plan Note (Signed)
Benefits from CPAP. ?Plan-DME to provide SD card and Airview.  Try caffeine tablet as discussed instead of Adderall.  Naps and good sleep hygiene reviewed. ?

## 2022-02-23 NOTE — Telephone Encounter (Signed)
Surgical Clearance form has been faxed to # [phone], confirmation received.  ?

## 2022-02-23 NOTE — Assessment & Plan Note (Signed)
Followed by neurology.  He is alert and appropriate but mobility is limited.  He is able to manage his CPAP. ?

## 2022-02-26 ENCOUNTER — Ambulatory Visit: Payer: Medicare (Managed Care) | Admitting: Physical Therapy

## 2022-02-27 ENCOUNTER — Other Ambulatory Visit: Payer: Self-pay | Admitting: Physical Medicine and Rehabilitation

## 2022-02-28 DIAGNOSIS — S72142A Displaced intertrochanteric fracture of left femur, initial encounter for closed fracture: Secondary | ICD-10-CM

## 2022-03-01 ENCOUNTER — Encounter: Payer: Medicare (Managed Care) | Admitting: Occupational Therapy

## 2022-03-01 ENCOUNTER — Ambulatory Visit: Payer: Medicare (Managed Care) | Admitting: Physical Therapy

## 2022-03-01 ENCOUNTER — Telehealth: Payer: Self-pay

## 2022-03-01 NOTE — Telephone Encounter (Signed)
Biotronik monitor not connected within 21 days. Attempted to contact patient to advise. No answer, LMTCB. ? ? ?

## 2022-03-04 ENCOUNTER — Encounter: Payer: Medicare (Managed Care) | Admitting: Occupational Therapy

## 2022-03-04 ENCOUNTER — Telehealth: Payer: Self-pay

## 2022-03-04 ENCOUNTER — Telehealth: Payer: Self-pay | Admitting: Orthopedic Surgery

## 2022-03-04 DIAGNOSIS — G4733 Obstructive sleep apnea (adult) (pediatric): Secondary | ICD-10-CM | POA: Diagnosis not present

## 2022-03-04 DIAGNOSIS — I7090 Unspecified atherosclerosis: Secondary | ICD-10-CM | POA: Diagnosis not present

## 2022-03-04 DIAGNOSIS — I480 Paroxysmal atrial fibrillation: Secondary | ICD-10-CM | POA: Diagnosis not present

## 2022-03-04 DIAGNOSIS — G20 Parkinson's disease: Secondary | ICD-10-CM | POA: Diagnosis not present

## 2022-03-04 DIAGNOSIS — Z794 Long term (current) use of insulin: Secondary | ICD-10-CM | POA: Diagnosis not present

## 2022-03-04 DIAGNOSIS — E785 Hyperlipidemia, unspecified: Secondary | ICD-10-CM | POA: Diagnosis not present

## 2022-03-04 DIAGNOSIS — E1169 Type 2 diabetes mellitus with other specified complication: Secondary | ICD-10-CM | POA: Diagnosis not present

## 2022-03-04 DIAGNOSIS — I10 Essential (primary) hypertension: Secondary | ICD-10-CM | POA: Diagnosis not present

## 2022-03-04 NOTE — Telephone Encounter (Signed)
Follow Up: ? ? ? ? ?Patient's wife is returning Elizabeth's call from 03-01-22. ?

## 2022-03-04 NOTE — Telephone Encounter (Signed)
Patient's wife called. Would like to know if patient could get in to see Dr. Marlou Sa or Lurena Joiner tomorrow. Says Medicare is kicking him out of the rehab facility due to WB status. Would like someone to call her. [phone] ?

## 2022-03-04 NOTE — Telephone Encounter (Signed)
Spoke with wife, patients biotronik home monitor has not communicated in 21 days she stated patient fell and broke his hip and is in rehab facility she stated she would take his remote monitoring box there to the SNF tonight, will check in AM to see if message received  ?

## 2022-03-04 NOTE — Telephone Encounter (Signed)
I worked patient into [employer] schedule tomorrow afternoon. She will try to make transportation arrangements ?

## 2022-03-05 ENCOUNTER — Encounter: Payer: Medicare (Managed Care) | Admitting: Surgical

## 2022-03-05 NOTE — Telephone Encounter (Signed)
I spoke with the patient wife and she is going to take the monitor to the Rehab facility. I told her I will call next week if nothing changes.  ?

## 2022-03-05 NOTE — Telephone Encounter (Signed)
See 03/05/2022 phone note. ?

## 2022-03-08 ENCOUNTER — Ambulatory Visit: Payer: Medicare (Managed Care) | Admitting: Physical Therapy

## 2022-03-08 ENCOUNTER — Encounter: Payer: Medicare (Managed Care) | Admitting: Occupational Therapy

## 2022-03-08 ENCOUNTER — Encounter: Payer: Medicare (Managed Care) | Admitting: Speech Pathology

## 2022-03-09 ENCOUNTER — Ambulatory Visit
Admission: RE | Admit: 2022-03-09 | Discharge: 2022-03-09 | Disposition: A | Discharge status: Home / Self Care | Payer: Medicare (Managed Care) | Source: Ambulatory Visit | Attending: Family Medicine | Admitting: Family Medicine

## 2022-03-09 DIAGNOSIS — S32000A Wedge compression fracture of unspecified lumbar vertebra, initial encounter for closed fracture: Secondary | ICD-10-CM

## 2022-03-10 ENCOUNTER — Other Ambulatory Visit: Payer: Self-pay | Admitting: Adult Health

## 2022-03-10 NOTE — Telephone Encounter (Signed)
Remote transmission not received to date. Unsuccessful call to wife to provided assistance if needed and to assess if she were able to take remote monitor to SNF. Hipaa compliant VM message left requesting call back. If no call back will document and close as wife has been spoken to about need for remote monitoring.  ?

## 2022-03-11 ENCOUNTER — Other Ambulatory Visit: Payer: Self-pay | Admitting: Physical Medicine and Rehabilitation

## 2022-03-12 ENCOUNTER — Ambulatory Visit (INDEPENDENT_AMBULATORY_CARE_PROVIDER_SITE_OTHER): Payer: Medicare (Managed Care) | Admitting: Orthopedic Surgery

## 2022-03-12 ENCOUNTER — Ambulatory Visit (INDEPENDENT_AMBULATORY_CARE_PROVIDER_SITE_OTHER): Payer: Medicare (Managed Care)

## 2022-03-12 ENCOUNTER — Encounter: Payer: Self-pay | Admitting: Orthopedic Surgery

## 2022-03-12 DIAGNOSIS — G8918 Other acute postprocedural pain: Secondary | ICD-10-CM

## 2022-03-12 NOTE — Telephone Encounter (Signed)
No return call from wife to date. Will close encounter. Patient has an appointment with [facility] 06/04/22.  ?

## 2022-03-12 NOTE — Progress Notes (Signed)
? ?Post-Op Visit Note ?  ?Patient: Jordan [name]           ?Date of Birth: [birth year]           ?MRN: [id number] ?Visit Date: 03/12/2022 ?PCP: [name], MD ? ? ?Assessment & Plan: ? ?Chief Complaint:  ?Chief Complaint  ?Patient presents with  ? Other  ?   ?02/08/22 left IM nail intertroch  ? ?Visit Diagnoses:  ?1. Post-op pain   ? ? ?Plan: [name] is an 81 year old patient who is now about a month out left intertrochanteric fracture fixation.  He has been partial weightbearing with a walker in a rehab facility.  On examination no calf tenderness negative Homans.  No real pain with range of motion of the left hip.  Radiographs show healed fracture.  No hardware complications.  Plan is physical therapy here 2-3 times a week for 4 weeks for gait training and strengthening and follow-up with [place] as needed ? ?Follow-Up Instructions: No follow-ups on file.  ? ?Orders:  ?Orders Placed This Encounter  ?Procedures  ? XR FEMUR MIN 2 VIEWS LEFT  ? ?No orders of the defined types were placed in this encounter. ? ? ?Imaging: ?No results found. ? ?PMFS History: ?Patient Active Problem List  ? Diagnosis Date Noted  ? Closed intertrochanteric fracture of left hip, initial encounter ([place])   ? Closed left hip fracture ([place]) secondary to fall at home 02/08/2022  ? Paroxysmal atrial fibrillation ([place]) 02/08/2022  ? Debility 01/08/2022  ? Constipation 01/07/2022  ? Physical deconditioning 01/07/2022  ? Acute metabolic encephalopathy 19/37/9024  ? Hypomagnesemia 01/04/2022  ? Pancreatitis 12/30/2021  ? Parkinson's disease ([place]) 12/21/2021  ? Parkinson disease ([place]) 12/18/2021  ? Frequent falls 12/18/2021  ? History of CVA (cerebrovascular accident) 12/18/2021  ? 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 Hallucinations, visual 07/30/2021  ? Acetabulum fracture, left ([place]) 06/25/2021  ? Acute hip pain, left 06/24/2021  ? Fall at home, initial encounter 06/24/2021  ? Leukocytosis 06/24/2021  ? Chronic diastolic heart failure ([place])   ?  Sinusitis, acute maxillary 06/24/2020  ? AKI (acute kidney injury) ([place])   ? Labile blood glucose   ? Cough   ? Benign essential HTN   ? Skin lesion of hand   ? Hypoalbuminemia due to protein-calorie malnutrition ([place])   ? Controlled type 2 diabetes mellitus with hyperglycemia, without long-term current use of insulin ([place])   ? 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 Morbid obesity ([place])   ? Acute blood loss anemia   ? Acute thalamic infarction ([place]) 04/15/2020  ? 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 TIA (transient ischemic attack) 02/17/2018  ? Type 2 diabetes mellitus with hyperlipidemia ([place]) 02/17/2018  ? Insomnia 02/01/2018  ? 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 Chronotropic incompetence 07/09/2014  ? Dyspnea on exertion 03/03/2014  ? HLD (hyperlipidemia) 12/13/2007  ? Essential hypertension 12/13/2007  ? Class 1 obesity 12/05/2007  ? 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 Coronary atherosclerosis 12/05/2007  ? ?Past Medical History:  ?Diagnosis Date  ? Arthritis   ? R shoulder, bone spur  ? Arthritis   ? "hands" (03/25/2016)  ? Chronic diastolic heart failure ([place])   ? Chronic lower back pain   ? Coronary heart disease   ? Dr Pernell Dupre  ? H/O cardiovascular stress test   ? perhaps last one was 2009  ? Heart murmur   ? 12/29/11 echo: mild MR, no AS, trivial TR  ?  History of gout   ? Hyperlipidemia   ? Hypertension   ? saw Dr. Linard Millers last earl;y- 2014, cardiac cath. last done ?2009, blocks seen didn't require any intervention at that point.  ? Narcolepsy   ? [name] 04-11-97; Mean Latency 1.66mn, SOREM 2  ? OSA on CPAP   ? NPSG 12-13-98 AHI 22.7  ? PONV (postoperative nausea and vomiting)   ? Presence of permanent cardiac pacemaker   ? Shortness of breath   ? with exertion   ? Stroke ([facility]   ? Type II diabetes mellitus ([place]   ?  ?Family History  ?Problem Relation Age of Onset  ? Sleep apnea Sister   ? Colon cancer Sister   ?  ?Past Surgical History:  ?Procedure  Laterality Date  ? CARDIAC CATHETERIZATION  2009?  ? EP IMPLANTABLE DEVICE N/A 03/25/2016  ? Procedure: Pacemaker Implant - Dual Chamber;  Surgeon: GEvans Lance MD;  [facility];  Service: Cardiovascular;  Laterality: N/A;  ? FRACTURE SURGERY    ? INSERT / REPLACE / REMOVE PACEMAKER  03/24/2016  ? INTRAMEDULLARY (IM) NAIL INTERTROCHANTERIC Left 02/08/2022  ? Procedure: INTRAMEDULLARY (IM) NAIL INTERTROCHANTRIC;  Surgeon: DMeredith Pel MD;  [facility]  Service: Orthopedics;  Laterality: Left;  ? IR KYPHO LUMBAR INC FX REDUCE BONE BX UNI/BIL CANNULATION INC/IMAGING  12/29/2021  ? IR RADIOLOGIST EVAL & MGMT  12/15/2021  ? LEFT AND RIGHT HEART CATHETERIZATION WITH CORONARY ANGIOGRAM N/A 06/05/2014  ? Procedure: LEFT AND RIGHT HEART CATHETERIZATION WITH CORONARY ANGIOGRAM;  Surgeon: HSinclair Grooms MD;  [facility];  Service: Cardiovascular;  Laterality: N/A;  ? NASAL FRACTURE SURGERY    ? SHOULDER ARTHROSCOPY WITH ROTATOR CUFF REPAIR AND SUBACROMIAL DECOMPRESSION Right 08/16/2013  ? Procedure: SHOULDER ARTHROSCOPY WITH ROTATOR CUFF REPAIR AND SUBACROMIAL DECOMPRESSION;  Surgeon: JNita Sells MD;  [place]  Service: Orthopedics;  Laterality: Right;  Right shoulder arthroscopy rotator cuff repair, subacromial decompression.  ? TONSILLECTOMY    ? ?Social History  ? ?Occupational History  ? Occupation: Insurance Sales-[employer]  ? Occupation: [employer]  ?Tobacco Use  ? Smoking status: Former  ?  Packs/day: 2.50  ?  Years: 6.00  ?  Pack years: 15.00  ?  Types: Cigarettes  ?  Quit date: 06/05/1967  ?  Years since quitting: 54.8  ? Smokeless tobacco: Never  ?Vaping Use  ? Vaping Use: Never used  ?Substance and Sexual Activity  ? Alcohol use: Yes  ?  Comment: rare  ? Drug use: No  ? Sexual activity: Not Currently  ? ? ? ?

## 2022-03-15 ENCOUNTER — Ambulatory Visit: Payer: Medicare (Managed Care) | Admitting: Physical Therapy

## 2022-03-15 ENCOUNTER — Encounter: Payer: Medicare (Managed Care) | Admitting: Occupational Therapy

## 2022-03-15 NOTE — Addendum Note (Signed)
Addended byLaurann [place] on: 03/15/2022 10:26 AM ? ? Modules accepted: Orders ? ?

## 2022-03-16 ENCOUNTER — Ambulatory Visit
Admission: RE | Admit: 2022-03-16 | Discharge: 2022-03-16 | Disposition: A | Discharge status: Home / Self Care | Payer: Medicare (Managed Care) | Source: Ambulatory Visit | Attending: Family Medicine | Admitting: Family Medicine

## 2022-03-16 DIAGNOSIS — M85832 Other specified disorders of bone density and structure, left forearm: Secondary | ICD-10-CM | POA: Diagnosis not present

## 2022-03-16 DIAGNOSIS — M810 Age-related osteoporosis without current pathological fracture: Secondary | ICD-10-CM | POA: Diagnosis not present

## 2022-03-17 ENCOUNTER — Institutional Professional Consult (permissible substitution): Payer: Medicare (Managed Care) | Admitting: Neurology

## 2022-03-17 DIAGNOSIS — S72002D Fracture of unspecified part of neck of left femur, subsequent encounter for closed fracture with routine healing: Secondary | ICD-10-CM | POA: Diagnosis not present

## 2022-03-17 DIAGNOSIS — G20 Parkinson's disease: Secondary | ICD-10-CM | POA: Diagnosis not present

## 2022-03-17 DIAGNOSIS — G4733 Obstructive sleep apnea (adult) (pediatric): Secondary | ICD-10-CM | POA: Diagnosis not present

## 2022-03-17 DIAGNOSIS — I639 Cerebral infarction, unspecified: Secondary | ICD-10-CM | POA: Diagnosis not present

## 2022-03-17 DIAGNOSIS — E1169 Type 2 diabetes mellitus with other specified complication: Secondary | ICD-10-CM | POA: Diagnosis not present

## 2022-03-17 DIAGNOSIS — E785 Hyperlipidemia, unspecified: Secondary | ICD-10-CM | POA: Diagnosis not present

## 2022-03-17 DIAGNOSIS — I480 Paroxysmal atrial fibrillation: Secondary | ICD-10-CM | POA: Diagnosis not present

## 2022-03-17 DIAGNOSIS — I10 Essential (primary) hypertension: Secondary | ICD-10-CM | POA: Diagnosis not present

## 2022-03-18 ENCOUNTER — Ambulatory Visit: Payer: Medicare (Managed Care)

## 2022-03-18 ENCOUNTER — Ambulatory Visit: Payer: Medicare (Managed Care) | Admitting: Physical Therapy

## 2022-03-18 ENCOUNTER — Encounter: Payer: Medicare (Managed Care) | Admitting: Occupational Therapy

## 2022-03-22 ENCOUNTER — Ambulatory Visit: Payer: Medicare (Managed Care) | Admitting: Physical Therapy

## 2022-03-22 ENCOUNTER — Encounter: Payer: Medicare (Managed Care) | Admitting: Occupational Therapy

## 2022-03-24 ENCOUNTER — Other Ambulatory Visit: Payer: Self-pay

## 2022-03-24 MED ORDER — MAGNESIUM GLUCONATE 500 MG PO TABS: 250.0000 mg | ORAL_TABLET | Freq: Every day | ORAL | 0 refills | Status: DC

## 2022-03-25 ENCOUNTER — Encounter: Payer: Medicare (Managed Care) | Admitting: Occupational Therapy

## 2022-03-25 ENCOUNTER — Ambulatory Visit: Payer: Medicare (Managed Care) | Admitting: Physical Therapy

## 2022-03-25 ENCOUNTER — Encounter: Payer: Medicare (Managed Care) | Admitting: Speech Pathology

## 2022-03-30 ENCOUNTER — Ambulatory Visit: Payer: Medicare (Managed Care) | Admitting: Physical Therapy

## 2022-03-30 ENCOUNTER — Encounter: Payer: Medicare (Managed Care) | Admitting: Occupational Therapy

## 2022-04-01 ENCOUNTER — Ambulatory Visit: Payer: Medicare (Managed Care) | Admitting: Physical Therapy

## 2022-04-01 ENCOUNTER — Encounter: Payer: Medicare (Managed Care) | Admitting: Occupational Therapy

## 2022-04-05 ENCOUNTER — Encounter: Payer: Medicare (Managed Care) | Admitting: Occupational Therapy

## 2022-04-05 ENCOUNTER — Ambulatory Visit: Payer: Medicare (Managed Care) | Admitting: Physical Therapy

## 2022-04-07 ENCOUNTER — Telehealth: Payer: Self-pay

## 2022-04-07 NOTE — Telephone Encounter (Signed)
Spoke with patients wife as home monitor hasn't connected in 3 weeks she states she took monitor to facility informed her that its possible there isn't good cell signal there she states patient is coming home tomorrow informed patients wife to bring home monitor back and plug in and hopefully it will reconnect and send data patients wife voiced understanding

## 2022-04-08 ENCOUNTER — Encounter: Payer: Medicare (Managed Care) | Admitting: Occupational Therapy

## 2022-04-08 ENCOUNTER — Ambulatory Visit: Payer: Medicare (Managed Care) | Admitting: Physical Therapy

## 2022-04-08 DIAGNOSIS — E1169 Type 2 diabetes mellitus with other specified complication: Secondary | ICD-10-CM | POA: Diagnosis not present

## 2022-04-08 DIAGNOSIS — E785 Hyperlipidemia, unspecified: Secondary | ICD-10-CM | POA: Diagnosis not present

## 2022-04-08 DIAGNOSIS — G4733 Obstructive sleep apnea (adult) (pediatric): Secondary | ICD-10-CM | POA: Diagnosis not present

## 2022-04-08 DIAGNOSIS — I7090 Unspecified atherosclerosis: Secondary | ICD-10-CM | POA: Diagnosis not present

## 2022-04-08 DIAGNOSIS — I480 Paroxysmal atrial fibrillation: Secondary | ICD-10-CM | POA: Diagnosis not present

## 2022-04-08 DIAGNOSIS — Z794 Long term (current) use of insulin: Secondary | ICD-10-CM | POA: Diagnosis not present

## 2022-04-08 DIAGNOSIS — I10 Essential (primary) hypertension: Secondary | ICD-10-CM | POA: Diagnosis not present

## 2022-04-08 DIAGNOSIS — I639 Cerebral infarction, unspecified: Secondary | ICD-10-CM | POA: Diagnosis not present

## 2022-04-09 ENCOUNTER — Other Ambulatory Visit: Payer: Self-pay | Admitting: Physical Medicine and Rehabilitation

## 2022-04-12 ENCOUNTER — Encounter: Payer: Medicare (Managed Care) | Admitting: Occupational Therapy

## 2022-04-12 ENCOUNTER — Ambulatory Visit: Payer: Medicare (Managed Care) | Admitting: Physical Therapy

## 2022-04-13 DIAGNOSIS — E7800 Pure hypercholesterolemia, unspecified: Secondary | ICD-10-CM | POA: Diagnosis not present

## 2022-04-13 DIAGNOSIS — I679 Cerebrovascular disease, unspecified: Secondary | ICD-10-CM | POA: Diagnosis not present

## 2022-04-13 DIAGNOSIS — G47419 Narcolepsy without cataplexy: Secondary | ICD-10-CM | POA: Diagnosis not present

## 2022-04-13 DIAGNOSIS — I10 Essential (primary) hypertension: Secondary | ICD-10-CM | POA: Diagnosis not present

## 2022-04-13 DIAGNOSIS — E1169 Type 2 diabetes mellitus with other specified complication: Secondary | ICD-10-CM | POA: Diagnosis not present

## 2022-04-13 DIAGNOSIS — Z8781 Personal history of (healed) traumatic fracture: Secondary | ICD-10-CM | POA: Diagnosis not present

## 2022-04-13 DIAGNOSIS — G214 Vascular parkinsonism: Secondary | ICD-10-CM | POA: Diagnosis not present

## 2022-04-13 DIAGNOSIS — B353 Tinea pedis: Secondary | ICD-10-CM | POA: Diagnosis not present

## 2022-04-15 ENCOUNTER — Ambulatory Visit: Payer: Medicare (Managed Care) | Admitting: Physical Therapy

## 2022-04-15 ENCOUNTER — Encounter: Payer: Medicare (Managed Care) | Admitting: Occupational Therapy

## 2022-04-17 ENCOUNTER — Other Ambulatory Visit: Payer: Self-pay | Admitting: Physical Medicine and Rehabilitation

## 2022-04-19 ENCOUNTER — Encounter
Payer: Medicare (Managed Care) | Attending: Physical Medicine and Rehabilitation | Admitting: Physical Medicine and Rehabilitation

## 2022-04-19 ENCOUNTER — Encounter: Payer: Self-pay | Admitting: Physical Medicine and Rehabilitation

## 2022-04-19 VITALS — BP 100/64 | HR 60 | Ht 69.0 in

## 2022-04-19 DIAGNOSIS — S72145S Nondisplaced intertrochanteric fracture of left femur, sequela: Secondary | ICD-10-CM | POA: Diagnosis not present

## 2022-04-19 DIAGNOSIS — G20A1 Parkinson's disease without dyskinesia, without mention of fluctuations: Secondary | ICD-10-CM

## 2022-04-19 DIAGNOSIS — I6381 Other cerebral infarction due to occlusion or stenosis of small artery: Secondary | ICD-10-CM

## 2022-04-19 DIAGNOSIS — H40013 Open angle with borderline findings, low risk, bilateral: Secondary | ICD-10-CM | POA: Diagnosis not present

## 2022-04-19 DIAGNOSIS — G20 Parkinson's disease: Secondary | ICD-10-CM | POA: Diagnosis not present

## 2022-04-19 NOTE — Progress Notes (Signed)
Subjective:    Patient ID: Jordan [name], male    DOB: 08/10/1941, 81 y.o.   MRN: [id number]  HPI Jordan [name] is an 81 year old man who presents for hospital follow-up after hip fracture.   1) Left hip fracture:  -he is ambulating with a walker CG from wife -his wife accompanies him today and provides most of history -his walking discharge is very short.   2) Parkinson's: -sometimes muscles don't cooperate the way they should.  3) Right leg weakness: -when walking.  -wife is nervous about him falling -he was in skilled rehab and was discharged June 8th  -they have not yet set up outpatient therapy -he has not had any professional therapy since discharge on June 8th.   4) 6 bottom teeth removed -has more of a challenge in forming words.   Pain Inventory Average Pain  varies between 3-6 Pain Right Now 3 My pain is intermittent, dull, and aching  LOCATION OF PAIN  back and lower left hip  BOWEL Number of stools per week: 1-2 Oral laxative use Yes  Type of laxative Miralax, senna plus   BLADDER Pads Bladder incontinence Yes  Frequent urination No  Leakage with coughing No  Difficulty starting stream No  Incomplete bladder emptying No    Mobility walk with assistance use a walker how many minutes can you walk? 1-2 ability to climb steps?  no do you drive?  no use a wheelchair needs help with transfers Do you have any goals in this area?  yes  Function retired  Neuro/Psych bladder control problems weakness tremor trouble walking dizziness loss of taste or smell  Prior Studies Any changes since last visit?  no  Physicians involved in your care Any changes since last visit?  no   Family History  Problem Relation Age of Onset  . Sleep apnea Sister   . Colon cancer Sister    Social History   Socioeconomic History  . Marital status: Married    Spouse name: [name]  . Number of children: Not on file  . Years of education: Not on file  .  Highest education level: Not on file  Occupational History  . Occupation: Chiropodist  . Occupation: [place] [employer]  Tobacco Use  . Smoking status: Former    Packs/day: 2.50    Years: 6.00    Total pack years: 15.00    Types: Cigarettes    Quit date: 06/05/1967    Years since quitting: 54.9  . Smokeless tobacco: Never  Vaping Use  . Vaping Use: Never used  Substance and Sexual Activity  . Alcohol use: Yes    Comment: rare  . Drug use: No  . Sexual activity: Not Currently  Other Topics Concern  . Not on file  Social History Narrative   Lives with wife and son   Right handed   Drinks 1 cups caffeine daily   Social Determinants of Health   Financial Resource Strain: Not on file  Food Insecurity: Not on file  Transportation Needs: Not on file  Physical Activity: Not on file  Stress: Not on file  Social Connections: Not on file   Past Surgical History:  Procedure Laterality Date  . CARDIAC CATHETERIZATION  2009?  . EP IMPLANTABLE DEVICE N/A 03/25/2016   Procedure: Pacemaker Implant - Dual Chamber;  Surgeon: Evans Lance, MD;  Location: [facility];  Service: Cardiovascular;  Laterality: N/A;  . FRACTURE SURGERY    . INSERT /  REPLACE / REMOVE PACEMAKER  03/24/2016  . INTRAMEDULLARY (IM) NAIL INTERTROCHANTERIC Left 02/08/2022   Procedure: INTRAMEDULLARY (IM) NAIL INTERTROCHANTRIC;  Surgeon: Meredith Pel, MD;  Location: [facility];  Service: Orthopedics;  Laterality: Left;  . IR KYPHO LUMBAR INC FX REDUCE BONE BX UNI/BIL CANNULATION INC/IMAGING  12/29/2021  . IR RADIOLOGIST EVAL & MGMT  12/15/2021  . LEFT AND RIGHT HEART CATHETERIZATION WITH CORONARY ANGIOGRAM N/A 06/05/2014   Procedure: LEFT AND RIGHT HEART CATHETERIZATION WITH CORONARY ANGIOGRAM;  Surgeon: Sinclair Grooms, MD;  Location: [facility];  Service: Cardiovascular;  Laterality: N/A;  . NASAL FRACTURE SURGERY    . SHOULDER ARTHROSCOPY WITH ROTATOR CUFF REPAIR AND SUBACROMIAL DECOMPRESSION Right  08/16/2013   Procedure: SHOULDER ARTHROSCOPY WITH ROTATOR CUFF REPAIR AND SUBACROMIAL DECOMPRESSION;  Surgeon: [name], MD;  Location: [facility];  Service: Orthopedics;  Laterality: Right;  Right shoulder arthroscopy rotator cuff repair, subacromial decompression.  . TONSILLECTOMY     Past Medical History:  Diagnosis Date  . Arthritis    R shoulder, bone spur  . Arthritis    "hands" (03/25/2016)  . Chronic diastolic heart failure ([place])   . Chronic lower back pain   . Coronary heart disease    Dr Pernell Dupre  . H/O cardiovascular stress test    perhaps last one was 2009  . Heart murmur    12/29/11 echo: mild MR, no AS, trivial TR  . History of gout   . Hyperlipidemia   . Hypertension    saw Dr. Linard Millers last earl;y- 2014, cardiac cath. last done ?2009, blocks seen didn't require any intervention at that point.  . Narcolepsy    [name] 04-11-97; Mean Latency 1.47mn, SOREM 2  . OSA on CPAP    NPSG 12-13-98 AHI 22.7  . PONV (postoperative nausea and vomiting)   . Presence of permanent cardiac pacemaker   . Shortness of breath    with exertion   . Stroke ([place]   . Type II diabetes mellitus (HCC)    BP 100/64   Pulse 60   Ht '5\' 9"'$  (1.753 m)   SpO2 90%   BMI 31.94 kg/m   Opioid Risk Score:   Fall Risk Score:  `1  [facility]     04/19/2022   11:18 AM 05/09/2020   10:37 AM  Depression screen PHQ 2/9  Decreased Interest 0 0  Down, Depressed, Hopeless 1 0  PHQ - 2 Score 1 0  Altered sleeping 0   Tired, decreased energy 1   Change in appetite 0   Feeling bad or failure about yourself  0   Trouble concentrating 0   Moving slowly or fidgety/restless 1   Suicidal thoughts 0   PHQ-9 Score 3      Review of Systems  Constitutional: Negative.   HENT: Negative.    Eyes: Negative.   Respiratory:  Positive for apnea and cough.   Cardiovascular: Negative.   Gastrointestinal:  Positive for constipation.  Endocrine: Negative.   Genitourinary:  Positive  for urgency.  Musculoskeletal:  Positive for back pain and gait problem.  Skin: Negative.   Allergic/Immunologic: Negative.   Neurological:  Positive for dizziness, tremors and weakness.  Hematological:  Bruises/bleeds easily.  Psychiatric/Behavioral: Negative.        Objective:   Physical Exam Gen: no distress, normal appearing, 100/64 HEENT: oral mucosa pink and moist, NCAT Cardio: Reg rate Chest: normal effort, normal rate of breathing Abd: soft, non-distended Ext: no  edema Psych: pleasant, normal affect Skin: intact Neuro: Alert, flat affect, good sense of humor, wife has to provide most of history Musculoskeletal: In WC, able to use bathroom with wife's assistance       Assessment & Plan:  1) CVA -prescribed PT and OT and SLP -would benefit from handicap placard to increase mobility in the community -reviewed all medications and provided necessary refills. -discussed vagal nerve stimulation if strength fails to improve in 6 months.   2) Fatigue -continue vitamin D supplement -discussed increasing Amantadine dose to '200mg'$ , versus keeping the same dose, versus starting modafinil or Ritalin. Discussed potential side effects of insomnia at night.  Provided dietary education.   3) R leg weakess: -discussed relation with quadriceps weakness.   4) Parkinson's Disease:

## 2022-04-21 ENCOUNTER — Ambulatory Visit: Payer: Medicare (Managed Care) | Admitting: Occupational Therapy

## 2022-04-21 ENCOUNTER — Encounter: Payer: Self-pay | Admitting: Physical Therapy

## 2022-04-21 ENCOUNTER — Ambulatory Visit: Payer: Medicare (Managed Care)

## 2022-04-21 ENCOUNTER — Ambulatory Visit: Payer: Medicare (Managed Care) | Attending: Family Medicine | Admitting: Physical Therapy

## 2022-04-21 DIAGNOSIS — G20 Parkinson's disease: Secondary | ICD-10-CM | POA: Diagnosis not present

## 2022-04-21 DIAGNOSIS — R251 Tremor, unspecified: Secondary | ICD-10-CM

## 2022-04-21 DIAGNOSIS — S72145S Nondisplaced intertrochanteric fracture of left femur, sequela: Secondary | ICD-10-CM | POA: Insufficient documentation

## 2022-04-21 DIAGNOSIS — G4733 Obstructive sleep apnea (adult) (pediatric): Secondary | ICD-10-CM | POA: Diagnosis not present

## 2022-04-21 DIAGNOSIS — R278 Other lack of coordination: Secondary | ICD-10-CM | POA: Diagnosis not present

## 2022-04-21 DIAGNOSIS — R471 Dysarthria and anarthria: Secondary | ICD-10-CM | POA: Insufficient documentation

## 2022-04-21 DIAGNOSIS — R2681 Unsteadiness on feet: Secondary | ICD-10-CM | POA: Diagnosis not present

## 2022-04-21 DIAGNOSIS — R41841 Cognitive communication deficit: Secondary | ICD-10-CM | POA: Insufficient documentation

## 2022-04-21 DIAGNOSIS — R293 Abnormal posture: Secondary | ICD-10-CM | POA: Insufficient documentation

## 2022-04-21 DIAGNOSIS — R41844 Frontal lobe and executive function deficit: Secondary | ICD-10-CM

## 2022-04-21 DIAGNOSIS — M6281 Muscle weakness (generalized): Secondary | ICD-10-CM | POA: Insufficient documentation

## 2022-04-21 DIAGNOSIS — R2689 Other abnormalities of gait and mobility: Secondary | ICD-10-CM | POA: Diagnosis not present

## 2022-04-21 DIAGNOSIS — R41840 Attention and concentration deficit: Secondary | ICD-10-CM

## 2022-04-21 DIAGNOSIS — I6381 Other cerebral infarction due to occlusion or stenosis of small artery: Secondary | ICD-10-CM | POA: Insufficient documentation

## 2022-04-21 NOTE — Therapy (Signed)
OUTPATIENT SPEECH LANGUAGE PATHOLOGY PARKINSON'S EVALUATION   Patient Name: Jordan [name] MRN: [id number] DOB:Mar 04, 1941, 81 y.o., male Today's Date: 04/21/2022  PCP: Jordan Frees MD REFERRING PROVIDER: Izora Ribas, MD    End of Session - 04/21/22 1336     Visit Number 1    Number of Visits 25    Date for SLP Re-Evaluation 07/16/22    Authorization Type [employer]  $20 co pay per day    SLP Start Time 1445    SLP Stop Time  32    SLP Time Calculation (min) 45 min    Activity Tolerance Patient tolerated treatment well             Past Medical History:  Diagnosis Date   Arthritis    R shoulder, bone spur   Arthritis    "hands" (03/25/2016)   Chronic diastolic heart failure (HCC)    Chronic lower back pain    Coronary heart disease    Dr Jordan [name]   H/O cardiovascular stress test    perhaps last one was 2009   Heart murmur    12/29/11 echo: mild MR, no AS, trivial TR   History of gout    Hyperlipidemia    Hypertension    saw Dr. Linard [name] last earl;y- 2014, cardiac cath. last done ?2009, blocks seen didn't require any intervention at that point.   Narcolepsy    [name] 04-11-97; Mean Latency 1.6mn, SOREM 2   OSA on CPAP    NPSG 12-13-98 AHI 22.7   PONV (postoperative nausea and vomiting)    Presence of permanent cardiac pacemaker    Shortness of breath    with exertion    Stroke ([place]    Type II diabetes mellitus ([place]    Past Surgical History:  Procedure Laterality Date   CARDIAC CATHETERIZATION  2009?   EP IMPLANTABLE DEVICE N/A 03/25/2016   Procedure: Pacemaker Implant - Dual Chamber;  Surgeon: GEvans Lance MD;  [facility];  Service: Cardiovascular;  Laterality: N/A;   FRACTURE SURGERY     INSERT / REPLACE / REMOVE PACEMAKER  03/24/2016   INTRAMEDULLARY (IM) NAIL INTERTROCHANTERIC Left 02/08/2022   Procedure: INTRAMEDULLARY (IM) NAIL INTERTROCHANTRIC;  Surgeon: DMeredith Pel MD;  [place]  Service:  Orthopedics;  Laterality: Left;   IR KYPHO LUMBAR INC FX REDUCE BONE BX UNI/BIL CANNULATION INC/IMAGING  12/29/2021   IR RADIOLOGIST EVAL & MGMT  12/15/2021   LEFT AND RIGHT HEART CATHETERIZATION WITH CORONARY ANGIOGRAM N/A 06/05/2014   Procedure: LEFT AND RIGHT HEART CATHETERIZATION WITH CORONARY ANGIOGRAM;  Surgeon: HSinclair Grooms MD;  [facility];  Service: Cardiovascular;  Laterality: N/A;   NASAL FRACTURE SURGERY     SHOULDER ARTHROSCOPY WITH ROTATOR CUFF REPAIR AND SUBACROMIAL DECOMPRESSION Right 08/16/2013   Procedure: SHOULDER ARTHROSCOPY WITH ROTATOR CUFF REPAIR AND SUBACROMIAL DECOMPRESSION;  Surgeon: JNita Sells MD;  [place]  Service: Orthopedics;  Laterality: Right;  Right shoulder arthroscopy rotator cuff repair, subacromial decompression.   TONSILLECTOMY     Patient Active Problem List   Diagnosis Date Noted   Closed intertrochanteric fracture of left hip, initial encounter ([facility]    Closed left hip fracture ([place] secondary to fall at home 02/08/2022   Paroxysmal atrial fibrillation ([place] 02/08/2022   Debility 01/08/2022   Constipation 01/07/2022   Physical deconditioning 009/23/3007  Acute metabolic encephalopathy 062/26/3335  Hypomagnesemia 01/04/2022   Pancreatitis 12/30/2021   Parkinson's disease (  [place]) 12/21/2021   Parkinson disease ([place]) 12/18/2021   Frequent falls 12/18/2021   History of CVA (cerebrovascular accident) 12/18/2021   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   Hallucinations, visual 07/30/2021   Acetabulum fracture, left ([place]) 06/25/2021   Acute hip pain, left 06/24/2021   Fall at home, initial encounter 06/24/2021   Leukocytosis 06/24/2021   Chronic diastolic heart failure ([place])    Sinusitis, acute maxillary 06/24/2020   AKI (acute kidney injury) ([place])    Labile blood glucose    Cough    Benign essential HTN    Skin lesion of hand    Hypoalbuminemia due to protein-calorie malnutrition ([place])    Controlled type 2 diabetes  mellitus with hyperglycemia, without long-term current use of insulin ([place])    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   Morbid obesity ([place])    Acute blood loss anemia    Acute thalamic infarction ([place]) 04/15/2020   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[place]) 05/25/2019   TIA (transient ischemic attack) 02/17/2018   Type 2 diabetes mellitus with hyperlipidemia ([place]) 02/17/2018   Insomnia 02/01/2018   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   Chronotropic incompetence 07/09/2014   Dyspnea on exertion 03/03/2014   HLD (hyperlipidemia) 12/13/2007   Essential hypertension 12/13/2007   Class 1 obesity 12/05/2007   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   Coronary atherosclerosis 12/05/2007    ONSET DATE: 04/19/2022 (referral); Stroke June 2021; Parkinson's Disease January 2022   REFERRING DIAG: I90.81 (ICD-10-CM) - 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 Multiple falls with most recent in April 2023  PAIN:  Are you having pain? No  FALLS: Has patient fallen in last 6 months?  Yes, See PT evaluation for details  LIVING ENVIRONMENT: Lives with: lives with their spouse Lives in: House/apartment  PLOF:  Level of assistance: Needed assistance with ADLs, Needed assistance with IADLS (independent prior to CVA in 2021) Employment: Retired - Radiographer, therapeutic  PATIENT GOALS to improve loudness and breath support   OBJECTIVE:   DIAGNOSTIC FINDINGS: MBSS (01/04/22) Patient presents with a mild-moderate oral and a mild pharyngeal phase dysphagia. During oral phase, patient with decreased bolus cohesion with barium tablet  in puree solids, requiring thin liquid barium sip to transit. He exhibited a munch chewing pattern with graham cracker. He did exhibit decreased anterior to posterior movement of liquids as well but with good oral containment of bolus and no premature spillage in vallecular sinus observed. During pharyngeal phase of swallow, patient exhibited a couple instances of mildly delayed swallow initiation at level of vallecular sinus and trace amount of vallecular residuals observed post initial swallows with thin liquids. No penetration or aspiration observed and cricopharyngeal phase of swallow appeared [facility] with no backflow of barium. Esophageal sweep did not reveal any significant s/s of dysmotility or stasis of barium. Pharyngeal phase of swallow was [facility] for puree solids, barium tablet in puree and regular texture solids. SLP is recommending to upgrade liquids to thin and continue with regular solids.  COGNITION: Overall cognitive status: Impaired Areas of impairment: Attention and Memory Comments: Some baseline deficits indicated with no overt changes since last ST evaluation - plan to teach compensatory strategies to aid recall and attention although  wife is currently managing all iADLs. Pt having difficulty recalling daily plans and losing train of thought in conversation   MOTOR SPEECH: Overall motor speech: impaired Level of impairment: Conversation Respiration: thoracic breathing and clavicular breathing Phonation: low vocal intensity Resonance: WFL Articulation: Impaired: conversation Intelligibility: Intelligibility reduced Motor planning: Appears intact Motor speech errors: unaware Interfering components: hearing loss Effective technique: increased vocal intensity and over articulate  ORAL MOTOR EXAMINATION Overall status: WFL  OBJECTIVE VOICE ASSESSMENT: Sustained "ah" maximum phonation time: 10 seconds Sustained "ah" loudness average: 85 dB Oral reading (passage) loudness average: 70  dB Oral reading loudness range: 65-75 dB Conversational loudness average: 65 dB Conversational loudness range: 63-71 dB Voice quality: low vocal intensity Stimulability trials: Given SLP modeling and occasional mod cues, loudness average increased to 71 dB (range of 69 to 71) at phrase level with SLP assisting with loudness modification (shouting versus strong, intentional voice).  Comments: Pt aware of reduced conversational volume and decreased breath support, which results in voice trailing off at end of sentences. Family implementing environmental modifications (muting television) to aid listener comprehension but pt still requires cued repetition to be understood better.    Pt does not report difficulty with swallowing warranting further evaluation. Rare difficulty with pill endorsed. No difficulty with solids or liquids endorsed.   PATIENT REPORTED OUTCOME MEASURES (PROM): Communication Participation Item Bank: 42 (Same score at last evaluation- 26 but wife previously rated him 85 indicating discrepancy in patient awareness of communication impairment)   TODAY'S TREATMENT:  04-21-22: Educated patient and wife on evaluation results and clinical observations. Both verbalized understanding and agreement with POC. Provided handout with Speak Out! Information to request free workbook.    PATIENT EDUCATION: Education details: see above Person educated: Patient and Spouse Education method: Explanation, Demonstration, and Handouts Education comprehension: verbalized understanding, returned demonstration, and needs further education   HOME EXERCISE PROGRAM: Order Speak Out! book   GOALS: Goals reviewed with patient? Yes  SHORT TERM GOALS: Target date: 05/19/2022    Pt will complete Speak Out! HEP at least 1x/day given occasional min A over 2 sessions  Baseline: Goal status: INITIAL  2.  Pt will achieve targeted dB (85-90 dB) on warm up exercises with 80% accuracy given occasional min  A over 2 sessions  Baseline:  Goal status: INITIAL  3.  Pt will achieve targeted dB (75-85 dB) on reading exercises with 80% accuracy given occasional min A over 2 sessions Baseline:  Goal status: INITIAL  4.  Pt will achieve targeted dB (72-78 dB) on cognitive exercises with 80% accuracy given occasional min A over 2 sessions Baseline:  Goal status: INITIAL  5.  Pt will utilize dysarthria compensations in 5-10 minute conversation to optimize vocal intensity and clarity given occasional mod A over 2 sessions  Baseline:  Goal status: INITIAL  6.  Pt will implement memory/attention compensations to aid daily functioning given occasional mod A over 2 sessions  Baseline:  Goal status: INITIAL  LONG TERM GOALS: Target date: 07/16/2022   Pt will complete Speak Out! HEP at least 1x/day (BID recommended) > 1 week  Baseline:  Goal status: INITIAL  2.  Pt will achieve targeted dB levels in demonstration of Speak Out! Lessons with 90% accuracy given rare min A over 2 sessions  Baseline:  Goal status: INITIAL  3.  Pt will utilize dysarthria compensations in 15+ minute conversation to optimize vocal intensity and clarity given occasional min A over 2 sessions Baseline:  Goal status: INITIAL  4.  Pt will implement memory/attention compensations to aid daily functioning given occasional min A over 2 sessions  Baseline:  Goal status: INITIAL  5.  Pt will report improved communication effectiveness via PROM by 2 point improvement by last ST session  Baseline: CPIB=26 Goal status: INITIAL  ASSESSMENT:  CLINICAL IMPRESSION: Patient is a 81 y.o. male who was seen today for previous thalamic stroke and Parkinson's disease. Pt evaluated by this SLP in April 2023 with plans to address hypokinetic dysarthria and cognitive changes; however, pt hospitalized after fall at home prior to ST treatment. 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 Pt aware of reduced conversational volume and decreased breath support at end of sentences but did not attempt to increase volume or breath support independently. Wife reports patient demonstrating some reduced articulatory precision and provides intermittent cues for patient to repeat self in order to understand him better, despite implementation of environmental modifications (muting television). Wife believes pt is at cognitive baseline from last ST evaluation and she manages appointments, medications, and finances as tasks are detailed oriented. Pt would benefit from training of cognitive compensations to aid recall of upcoming events and aid attention in conversation. Skilled ST is warranted to address dysarthria and cognition to maximize communication effectiveness and functional independence.   OBJECTIVE IMPAIRMENTS  Objective impairments include attention, memory, and dysarthria. These impairments are limiting patient from household responsibilities and effectively communicating at home and in community.Factors affecting potential to achieve goals and functional outcome are medical prognosis and previous level of function. Patient will benefit from skilled SLP services to address above impairments and improve overall function.  REHAB POTENTIAL: Good  PLAN: SLP FREQUENCY: 2x/week  SLP DURATION: 12 weeks  PLANNED INTERVENTIONS: Cueing hierachy, Cognitive reorganization, Internal/external aids, Functional tasks, Multimodal communication approach, SLP instruction and feedback, Compensatory strategies, and Patient/family education    Marzetta Board, CCC-SLP 04/21/2022, 1:36 PM

## 2022-04-21 NOTE — Patient Instructions (Signed)
SPEAK OUT! is a structured program targeting voice in patient's with Parkinson's. This program was developed by [employer]. It is evidence based and based on principles of motor learning. The nonprofit provides free materials to patients being treated by a Speak Out! certified SLP.    www.parkinsonvoiceproject.org for more information   Learn about Parkinson's webinar: https://parkinsonvoiceproject.org/education-training/monthly-webinar/ -- highly recommend watching this webinar!!   Order your stimulus booklet: [phone] Your provider: Rajveer Handler Johnson SLP, [facility]  This book is free!! They do offer a "pay if forward" option where you can donate to the non-profit organization so they can continue serving the Parkinson's population and providing these valuable resources to patients.  

## 2022-04-21 NOTE — Therapy (Signed)
[facility] [address] [place], [place], [address] Phone: [phone]   Fax:  [phone]  Patient Details  Name: Jordan [name] MRN: [id number] Date of Birth: 1941-07-12 Referring Provider:  Cathlyn Parsons, PA-C  Encounter Date: 04/21/2022  SPEECH THERAPY DISCHARGE SUMMARY  Visits from Start of Care: 1  Current functional level related to goals / functional outcomes: Pt did not return after initial OPST evaluation secondary to fall at home resulting in leg fracture and hospitalization.   Remaining deficits: PD, CVA deficits   Education / Equipment: Eval results and recommendations   Patient agrees to discharge. Patient goals were not met. Patient is being discharged due to a change in medical status.    SHORT TERM GOALS: Target date: 03/05/2022   Pt will complete dysarthria HEP given occasional min A over 2 sessions Baseline: Goal status: Not Met   2.  Pt will utilize dysarthria compensations on structured speech tasks with 90% accuracy given occasional min A over 2 sessions  Baseline:  Goal status: Not Met   3.  Pt will maintain WNL conversational volume (70-72 dB) in 5-10 minute conversation given occasional min A over 2 sessions  Baseline:  Goal status: Not Met   4.  Pt will implement memory/attention compensations to aid daily functioning given occasional mod A over 2 sessions  Baseline:  Goal status: Not Met     LONG TERM GOALS: Target date: 04/30/2022   Pt will complete dysarthria HEP at least 1x/week (BID recommended) > 1 week Baseline:  Goal status: Not Met   2.  Pt will maintain WNL conversational volume (70-72 dB) in 15-20 minute conversation given rare min A over 2 sessions Baseline:  Goal status: Not Met   3.  Pt will maintain WNL conversational volume (70-72 dB) for short monologue (pt would like to do motivational speaking) given rare min A over 2 sessions Baseline:  Goal status:  Not Met   4.  Pt will implement memory/attention compensations to aid daily functioning given occasional min A over 2 sessions  Baseline:  Goal status: Not Met   5.  Pt/wife will report improved communication effectiveness via PROM by 2 points at last ST sessions Baseline: CPIB= 26 (pt) & 20 (wife) Goal status: Not Met   Marzetta Board, CCC-SLP 04/21/2022, 12:58 PM  [facility] [address] [place], [place], [address] Phone: [phone]   Fax:  [phone]

## 2022-04-21 NOTE — Therapy (Signed)
OUTPATIENT OCCUPATIONAL THERAPY PARKINSON'S EVALUATION  Patient Name: Jordan [name] MRN: [id number] DOB:05/29/1941, 81 y.o., male Today's Date: 04/22/2022  PCP: Dr. Kenton Kingfisher REFERRING PROVIDER: Dr. Ranell Patrick   OT End of Session - 04/22/22 0932     Visit Number 1    Number of Visits 24    Date for OT Re-Evaluation 07/15/22    Authorization Type [employer]    Authorization Time Period $20 copay  VL:MN  Follow Medicare Guidelines    Authorization - Visit Number 1    Authorization - Number of Visits 10    OT Start Time 1319    OT Stop Time 1359    OT Time Calculation (min) 40 min    Activity Tolerance Patient tolerated treatment well    Behavior During Therapy [facility] for tasks assessed/performed             Past Medical History:  Diagnosis Date   Arthritis    R shoulder, bone spur   Arthritis    "hands" (03/25/2016)   Chronic diastolic heart failure (HCC)    Chronic lower back pain    Coronary heart disease    Dr Pernell Dupre   H/O cardiovascular stress test    perhaps last one was 2009   Heart murmur    12/29/11 echo: mild MR, no AS, trivial TR   History of gout    Hyperlipidemia    Hypertension    saw Dr. Linard Millers last earl;y- 2014, cardiac cath. last done ?2009, blocks seen didn't require any intervention at that point.   Narcolepsy    [name] 04-11-97; Mean Latency 1.22mn, SOREM 2   OSA on CPAP    NPSG 12-13-98 AHI 22.7   PONV (postoperative nausea and vomiting)    Presence of permanent cardiac pacemaker    Shortness of breath    with exertion    [place] ([place]    Type II diabetes mellitus ([place]    Past Surgical History:  Procedure Laterality Date   CARDIAC CATHETERIZATION  2009?   EP IMPLANTABLE DEVICE N/A 03/25/2016   Procedure: Pacemaker Implant - Dual Chamber;  Surgeon: GEvans Lance MD;  [facility];  Service: Cardiovascular;  Laterality: N/A;   FRACTURE SURGERY     INSERT / REPLACE / REMOVE PACEMAKER  03/24/2016   INTRAMEDULLARY (IM) NAIL  INTERTROCHANTERIC Left 02/08/2022   Procedure: INTRAMEDULLARY (IM) NAIL INTERTROCHANTRIC;  Surgeon: DMeredith Pel MD;  [facility]  Service: Orthopedics;  Laterality: Left;   IR KYPHO LUMBAR INC FX REDUCE BONE BX UNI/BIL CANNULATION INC/IMAGING  12/29/2021   IR RADIOLOGIST EVAL & MGMT  12/15/2021   LEFT AND RIGHT HEART CATHETERIZATION WITH CORONARY ANGIOGRAM N/A 06/05/2014   Procedure: LEFT AND RIGHT HEART CATHETERIZATION WITH CORONARY ANGIOGRAM;  Surgeon: HSinclair Grooms MD;  [facility];  Service: Cardiovascular;  Laterality: N/A;   NASAL FRACTURE SURGERY     SHOULDER ARTHROSCOPY WITH ROTATOR CUFF REPAIR AND SUBACROMIAL DECOMPRESSION Right 08/16/2013   Procedure: SHOULDER ARTHROSCOPY WITH ROTATOR CUFF REPAIR AND SUBACROMIAL DECOMPRESSION;  Surgeon: JNita Sells MD;  [place] [facility]  Service: Orthopedics;  Laterality: Right;  Right shoulder arthroscopy rotator cuff repair, subacromial decompression.   TONSILLECTOMY     Patient Active Problem List   Diagnosis Date Noted   Closed intertrochanteric fracture of left hip, initial encounter ([place]    Closed left hip fracture ([place] secondary to fall at home 02/08/2022   Paroxysmal atrial fibrillation ([place] 02/08/2022  Debility 01/08/2022   Constipation 01/07/2022   Physical deconditioning 01/75/1025   Acute metabolic encephalopathy 85/27/7824   Hypomagnesemia 01/04/2022   Pancreatitis 12/30/2021   Parkinson's disease ([place]) 12/21/2021   Parkinson disease ([place]) 12/18/2021   Frequent falls 12/18/2021   History of CVA (cerebrovascular accident) 12/18/2021   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   Hallucinations, visual 07/30/2021   Acetabulum fracture, left ([place]) 06/25/2021   Acute hip pain, left 06/24/2021   Fall at home, initial encounter 06/24/2021   Leukocytosis 06/24/2021   Chronic diastolic heart failure ([place])    Sinusitis, acute maxillary 06/24/2020   AKI (acute kidney injury) ([place])    Labile blood  glucose    Cough    Benign essential HTN    Skin lesion of hand    Hypoalbuminemia due to protein-calorie malnutrition ([place])    Controlled type 2 diabetes mellitus with hyperglycemia, without long-term current use of insulin (HCC)    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   Morbid obesity ([place])    Acute blood loss anemia    Acute thalamic infarction ([place]) 04/15/2020   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[place]) 05/25/2019   TIA (transient ischemic attack) 02/17/2018   Type 2 diabetes mellitus with hyperlipidemia ([place]) 02/17/2018   Insomnia 02/01/2018   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   Chronotropic incompetence 07/09/2014   Dyspnea on exertion 03/03/2014   HLD (hyperlipidemia) 12/13/2007   Essential hypertension 12/13/2007   Class 1 obesity 12/05/2007   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   Coronary atherosclerosis 12/05/2007    ONSET DATE: 04/19/22  REFERRING DIAG: thalamic CVA I63.81  THERAPY DIAG:  Muscle weakness (generalized) - Plan: Ot plan of care cert/re-cert  Unsteadiness on feet - Plan: Ot plan of care cert/re-cert  Abnormal posture - Plan: Ot plan of care cert/re-cert  Other lack of coordination - Plan: Ot plan of care cert/re-cert  Attention and concentration deficit - Plan: Ot plan of care cert/re-cert  Frontal lobe and executive function deficit - Plan: Ot plan of care cert/re-cert  Tremor - Plan: Ot plan of care cert/re-cert  Rationale for Evaluation and Treatment Rehabilitation  SUBJECTIVE:   SUBJECTIVE STATEMENT: Pt reports he wants to be more independent Pt accompanied by: significant other  PERTINENT HISTORY: Jordan [name] is an 81 year old male with history of CAD/chronic diastolic CHF, OSA, M3NT, gout, Parkinson's diease with recent hospital admission 02/17-02-22-23 worsening of gait disorder with falls with hallucinations which was treated with medication adjustment and he was discharged to home. He was  readmitted on 12/30/21 with abdominal pain with low grade fever, leucocytosis, lactic acidosis, mild confusion and lethargy secondary to sepsis. He was found to have elevated lipase-165  with inflammatory changes suggestive of primary pancreatitis with secondary inflammation of hepatic flexure of colon or primary sigmoid diverticulitis with secondary extension to pancreatic tail. He was treated with IVF and broad spectrum antibiotics.   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 Pt fell and sustained hip fx on 02/08/22 and pt underwent left  IM nailing.   Pt accompanied by: self and significant other, spouse, [name]    PRECAUTIONS: Fall and Other: , hx of back kyphoplasty  , pacemaker, hx of L hip fx, s/p IM nailing  WEIGHT BEARING RESTRICTIONS No  PAIN:  Are you having pain? Yes: NPRS scale: 3-4/10 Pain location: back Pain description: aching Aggravating factors: standing Relieving factors: repositioning  FALLS:  Has patient fallen in last 6 months? Yes. Number of falls 2-3   LIVING ENVIRONMENT: Lives with: lives with their spouse Lives in: House/apartment, condo Stairs: No Has following equipment at home: Gilford Rile - 2 wheeled  PLOF: Independent prior to CVA in 2021  PATIENT GOALS to be more independent, pt's wife states he would like to be able to drive  OBJECTIVE:   HAND DOMINANCE: Right  ADLs: Overall ADLs: slower performance, pt reports tremor, however no significant tremor noted during eval.  Transfers/ambulation related to ADLs: Eating: wife cuts food  Grooming: set up UB Dressing: supervision-min A increased time LB Dressing:max A increased time Toileting: min A walker Bathing: needs assist  Tub Shower transfers: min A walk in shower, shower chair Equipment: Shower seat with back and Walk in shower   IADLs:  Light housekeeping: dependent Meal Prep: dependent Community mobility: needs  assist  Handwriting: 100% legible and Mild micrographia  MOBILITY STATUS: Needs Assist: ambulates with walker at home  POSTURE COMMENTS:  rounded shoulders, forward head   FUNCTIONAL OUTCOME MEASURES: Standing functional reach: Right: NT inches; Left: NT inches Fastening/unfastening 3 buttons: 1 min 45 secs Physical performance test: PPT#2 (simulated eating) 16.66 & PPT#4 (donning/doffing jacket): NT  COORDINATION: 9 Hole Peg test: Right: 42.47 sec; Left: 42.25 sec Box and Blocks:  Right 45blocks, Left 37 blocks   UE ROM:  Not tested  UE MMT:   Not tested  SENSATION: Light touch: Impaired RUE      COGNITION: Overall cognitive status: Impaired, to be further assessed in a functional context   OBSERVATIONS: Bradykinesia   TODAY'S TREATMENT:  N/A eval only   PATIENT EDUCATION: Education details: role of OT Person educated: Patient and Spouse Education method: Explanation Education comprehension: verbalized understanding   HOME EXERCISE PROGRAM: N/A      GOALS: Goals reviewed with patient? No  SHORT TERM GOALS: Target date: 05/20/2022    I with initial HEP Baseline: Goal status: INITIAL  2.  Pt will verbalize understanding of adapted strategies to maximize safety and I with ADLs/ IADLs (QI:ONGEXBM food) Baseline:  Goal status: INITIAL  3.  Pt will demonstrate improved UE functional use as evidenced by increasing LUE box/ blocks score by 3 blocks. Baseline: RUE 45, LUE 37 Goal status: inital  4.  Pt will perform LB dressing with min A demonstrating good safety awareness. Baseline: mod-max A Goal status: INITIAL  5.  Pt will consistently donn pullover shirt mod I in a reasonable amount of time Baseline: increased time required and prn min assist Goal status: INITIAL  6.   Pt will verbalize understanding of ways to prevent future PD related complications and PD community resources.  Goal status: INITIAL  7.. Test grip strength, shoulder ROM  and set goals prn  Goal status: INITIAL  LONG TERM GOALS: Target date: 07/15/2022    Pt will demonstrate understanding of memory compensations and ways to keep thinking skills sharp  Goal status: INITIAL  2.  Pt will demonstrate improved fine motor coordination for ADLs as evidenced by decreasing 9 hole peg test score for bilateral UE's by 3 secs Baseline: RUE 42.47, LUE 42.25 Goal status: INITIAL  3.  Pt will demonstrate improved ease with feeding as evidenced by decreasing PPT#2 (self feeding) by 3 secs Baseline: 16.66 secs Goal status: INITIAL  4.  Pt will demonstrate improved ease with fastening buttons as evidenced by decreasing 3 button/ unbutton time to : 95 secs Baseline: 1 min 45 secs Goal status: INITIAL  5.  Pt will write a short paragraph with 100% legibility and no significant decrease in letter size Baseline: mild micrographia Goal status: INITIAL  6.  Assess test PPT#4 and set goal prn Goal status: INITIAL   ASSESSMENT:  CLINICAL IMPRESSION: Patient is a 81 y.o. male who was seen today for occupational therapy evaluation for s/p CVA, and PD. Pt was previously evaluated by OT in April 2023 then pt sustained a hip fx and he did not return to OP OT. 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 Pt fell and sustained L hip fx on 02/08/22 and pt underwent IM nailing.)    PERFORMANCE DEFICITS in functional skills including ADLs, IADLs, coordination, dexterity, sensation, ROM, strength, pain, flexibility, FMC, GMC, mobility, balance, endurance, decreased knowledge of precautions, decreased knowledge of use of DME, UE functional use, and bradykinesia, cognitive skills including attention, learn, memory, problem solving, safety awareness, and thought, and psychosocial skills including coping strategies, environmental adaptation, habits, interpersonal interactions, and routines and behaviors.    IMPAIRMENTS are limiting patient from ADLs, IADLs, rest and sleep, play, leisure, and social participation.   COMORBIDITIES may have co-morbidities  that affects occupational performance. Patient will benefit from skilled OT to address above impairments and improve overall function.  MODIFICATION OR ASSISTANCE TO COMPLETE EVALUATION: Min-Moderate modification of tasks or assist with assess necessary to complete an evaluation.  OT OCCUPATIONAL PROFILE AND HISTORY: Detailed assessment: Review of records and additional review of physical, cognitive, psychosocial history related to current functional performance.  CLINICAL DECISION MAKING: Moderate - several treatment options, min-mod task modification necessary  REHAB POTENTIAL: Good  EVALUATION COMPLEXITY: Moderate       PLAN: OT FREQUENCY: 2x/week  OT DURATION: 12 weeks plus eval  PLANNED INTERVENTIONS: self care/ADL training, therapeutic exercise, therapeutic activity, neuromuscular re-education, manual therapy, passive range of motion, balance training, functional mobility training, aquatic therapy, ultrasound, paraffin, fluidotherapy, moist heat, cryotherapy, contrast bath, patient/family education, cognitive remediation/compensation, visual/perceptual remediation/compensation, energy conservation, coping strategies training, and DME and/or AE instructions  RECOMMENDED OTHER SERVICES: n/a  CONSULTED AND AGREED WITH PLAN OF CARE: Patient and family member/caregiver  PLAN FOR NEXT SESSION: assess UE ROM and grip strength add goals prn, coordination HEP   Treazure Nery, OT 04/22/2022, 10:39 AM  Theone Murdoch, OTR/L [phone] Phone: [phone] 10:39 AM 04/22/22

## 2022-04-21 NOTE — Therapy (Signed)
OUTPATIENT PHYSICAL THERAPY NEURO EVALUATION   Patient Name: Jordan [name] MRN: [id number] DOB:06-13-1941, 81 y.o., male Today's Date: 04/22/2022  PCP: [name], MD REFERRING PROVIDER: Izora Ribas, MD   PT End of Session - 04/22/22 [phone]     Visit Number 1    Number of Visits 17    Date for PT Re-Evaluation 07/21/22    Authorization Type [employer]    PT Start Time 1403    PT Stop Time 1445    PT Time Calculation (min) 42 min    Equipment Utilized During Treatment Gait belt    Activity Tolerance Patient tolerated treatment well    Behavior During Therapy WFL for tasks assessed/performed              Past Medical History:  Diagnosis Date   Arthritis    R shoulder, bone spur   Arthritis    "hands" (03/25/2016)   Chronic diastolic heart failure (HCC)    Chronic lower back pain    Coronary heart disease    Dr Pernell Dupre   H/O cardiovascular stress test    perhaps last one was 2009   Heart murmur    12/29/11 echo: mild MR, no AS, trivial TR   History of gout    Hyperlipidemia    Hypertension    saw Dr. Linard Millers last earl;y- 2014, cardiac cath. last done ?2009, blocks seen didn't require any intervention at that point.   Narcolepsy    [name] 04-11-97; Mean Latency 1.33mn, SOREM 2   OSA on CPAP    NPSG 12-13-98 AHI 22.7   PONV (postoperative nausea and vomiting)    Presence of permanent cardiac pacemaker    Shortness of breath    with exertion    Stroke ([facility]    Type II diabetes mellitus ([facility]    Past Surgical History:  Procedure Laterality Date   CARDIAC CATHETERIZATION  2009?   EP IMPLANTABLE DEVICE N/A 03/25/2016   Procedure: Pacemaker Implant - Dual Chamber;  Surgeon: GEvans Lance MD;  [facility];  Service: Cardiovascular;  Laterality: N/A;   FRACTURE SURGERY     INSERT / REPLACE / REMOVE PACEMAKER  03/24/2016   INTRAMEDULLARY (IM) NAIL INTERTROCHANTERIC Left 02/08/2022   Procedure: INTRAMEDULLARY (IM) NAIL  INTERTROCHANTRIC;  Surgeon: DMeredith Pel MD;  [facility]  Service: Orthopedics;  Laterality: Left;   IR KYPHO LUMBAR INC FX REDUCE BONE BX UNI/BIL CANNULATION INC/IMAGING  12/29/2021   IR RADIOLOGIST EVAL & MGMT  12/15/2021   LEFT AND RIGHT HEART CATHETERIZATION WITH CORONARY ANGIOGRAM N/A 06/05/2014   Procedure: LEFT AND RIGHT HEART CATHETERIZATION WITH CORONARY ANGIOGRAM;  Surgeon: HSinclair Grooms MD;  [facility];  Service: Cardiovascular;  Laterality: N/A;   NASAL FRACTURE SURGERY     SHOULDER ARTHROSCOPY WITH ROTATOR CUFF REPAIR AND SUBACROMIAL DECOMPRESSION Right 08/16/2013   Procedure: SHOULDER ARTHROSCOPY WITH ROTATOR CUFF REPAIR AND SUBACROMIAL DECOMPRESSION;  Surgeon: JNita Sells MD;  [facility]  Service: Orthopedics;  Laterality: Right;  Right shoulder arthroscopy rotator cuff repair, subacromial decompression.   TONSILLECTOMY     Patient Active Problem List   Diagnosis Date Noted   Closed intertrochanteric fracture of left hip, initial encounter ([facility]    Closed left hip fracture ([place] secondary to fall at home 02/08/2022   Paroxysmal atrial fibrillation ([facility] [place] 02/08/2022   Debility 01/08/2022   Constipation 01/07/2022   Physical deconditioning 023/76/2831  Acute metabolic encephalopathy 051/76/1607  Hypomagnesemia 01/04/2022   Pancreatitis 12/30/2021   Parkinson's disease ([place]) 12/21/2021   Parkinson disease ([place]) 12/18/2021   Frequent falls 12/18/2021   History of CVA (cerebrovascular accident) 12/18/2021   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   Hallucinations, visual 07/30/2021   Acetabulum fracture, left ([place]) 06/25/2021   Acute hip pain, left 06/24/2021   Fall at home, initial encounter 06/24/2021   Leukocytosis 06/24/2021   Chronic diastolic heart failure ([place])    Sinusitis, acute maxillary 06/24/2020   AKI (acute kidney injury) ([place])    Labile blood glucose    Cough    Benign essential HTN    Skin lesion of hand     Hypoalbuminemia due to protein-calorie malnutrition ([place])    Controlled type 2 diabetes mellitus with hyperglycemia, without long-term current use of insulin ([place])    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   Morbid obesity ([place])    Acute blood loss anemia    Acute thalamic infarction ([place]) 04/15/2020   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[place]) 05/25/2019   TIA (transient ischemic attack) 02/17/2018   Type 2 diabetes mellitus with hyperlipidemia ([place]) 02/17/2018   Insomnia 02/01/2018   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   Chronotropic incompetence 07/09/2014   Dyspnea on exertion 03/03/2014   HLD (hyperlipidemia) 12/13/2007   Essential hypertension 12/13/2007   Class 1 obesity 12/05/2007   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   Coronary atherosclerosis 12/05/2007    ONSET DATE: 04/19/2022 (date of referral)  REFERRING DIAG: I63.81 (ICD-10-CM) - 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[place]) G20 (ICD-10-CM) - Parkinson's disease ([place]) S72.145S (ICD-10-CM) - Type III open nondisplaced intertrochanteric fracture of left femur, sequela   THERAPY DIAG:  Other abnormalities of gait and mobility  Muscle weakness (generalized)  Unsteadiness on feet  Abnormal posture  Other lack of coordination  SUBJECTIVE:                                                                                                                                                                                              SUBJECTIVE STATEMENT: Pt had a CVA in June of 2021 and then PD diagnosed in January of 2022. Pt reports remaining weakness in right hand from stroke. Had a fall in August of 2022 with a fracture of left hip socket. They did not have to operate but was NWB for several weeks and became further debilitated. Had several falls after that. Compression fracture of L2 vertebrae. On 12/29/21 had a kyphoplasty at L2. Recent admission for gait disorder with hallucinations requiring medication adjustment;  who was admitted on 12/30/21 with abdominal pain, low-grade fever, leukocytosis, lactic acidosis, mild confusion and lethargy  secondary to sepsis. Had inpatient rehab stay 3/10-3/20/23. Came to OPPT neuro rehab on 02/02/22 for eval only.   Was then addmitted on 02/08/22 after fall onto L-hip sustaining L intertrochanteric hip fx. S/p L hip IMN 4/10. Went to a SNF and was discharged June 8th.   Most of the time he goes places, he will travel with the transport chair to community events/transport chair. Last time he was using the RW in the community was October of 2022. Uses his RW all the time in the house. No other falls.    Pt accompanied by: significant other - wife [name]  PERTINENT HISTORY:  PMHx: PD (diagnosed Jan 2022), HLD, OSA, DM, HTN, chronic diastolic HF, CVA, frequent falls, L2 compression fx, L hip fx   PAIN:  Are you having pain? Yes: NPRS scale: 4/10 Pain location: Jaw Pain Pain description: Dull ache.  Aggravating factors: From recent dental surgery  Relieving factors: Not talking about it, taking Tylenol  Has hx of LBP that will flare up.   PRECAUTIONS: Fall and ICD/Pacemaker, history of compression fracture at L2 with recent kyphoplasty  WEIGHT BEARING RESTRICTIONS No - WBAT  FALLS: Has patient fallen in last 6 months? Yes. Number of falls 2-3  LIVING ENVIRONMENT: Lives with: lives with their family and lives with their spouse Lives in: House/apartment Stairs:  pt has an Media planner to get in to [employer]. Has following equipment at home: Gilford Rile - 2 wheeled, Wheelchair (manual), and Up walker (does not use it at home), transport chair, shower chair, walking stick  PLOF: Independent with household mobility with device  PATIENT GOALS: "Wants to improve so he can graduate from therapy", "wants to walk in a way that no one realizes what I have been through"   OBJECTIVE:   COGNITION: Overall cognitive status: Within functional limits for tasks assessed   SENSATION: Light  touch: WFL Pt does report some tingling in feet/toes at times. Occasional numbness/tingling in ends of fingers    POSTURE: rounded shoulders, forward head, and increased thoracic kyphosis  COORDINATION: Heel to shin, difficulty with LLE due to weakness.    MMT:    MMT Right 04/21/2022 Left 04/21/2022  Hip flexion 4+/5 2+/5  Hip extension    Hip abduction 4/5 4/5  Hip adduction 4/5 4/5  Hip internal rotation    Hip external rotation    Knee flexion 4/5 4/5  Knee extension 4+/5 4+/5  Ankle dorsiflexion 4/5 4-/5  Ankle plantarflexion    Ankle inversion    Ankle eversion    (Blank rows = not tested) All tested in sitting   TRANSFERS: Assistive device utilized: Environmental consultant - 2 wheeled  Sit to stand: SBA and CGA Stand to sit: SBA and CGA Pt braces legs on chair/mat at times. Leans posterior slightly and more forward flexed posture in standing. Able to perform from chair without UE support    GAIT: Gait pattern: step through pattern, decreased step length- Right, decreased step length- Left, decreased stance time- Left, decreased stride length, and trunk flexed Distance walked: 80' Assistive device utilized: Environmental consultant - 2 wheeled Level of assistance: CGA   FUNCTIONAL TESTs:  5 times sit to stand: 22.6 seconds without UE support from chair  Timed up and go (TUG): 33.32 with RW, cog TUG=34.32 seconds counting backwards by 3s 10 meter walk test: 37.39 seconds = .87 ft/sec with RW   TODAY'S TREATMENT:  N/A during eval.    PATIENT EDUCATION: Education details: Clinical findings, POC.  Person educated: Patient and  Spouse Education method: Explanation Education comprehension: verbalized understanding   HOME EXERCISE PROGRAM: Will give at next session.      GOALS: Goals reviewed with patient? Yes  SHORT TERM GOALS: Target date: 05/19/2022  Pt will be able to perform initial HEP for strengthening and balance to continue gains with supervision of wife. Baseline: Goal  status: INITIAL  2.  BERG to be assessed with STG/LTG written.  Baseline:  Goal status: INITIAL  3.  Pt will improve 5x sit<>stand to less than or equal to 19 sec without UE support to demonstrate improved functional strength and transfer efficiency.  Baseline: 22.6 seconds without UE support from chair Goal status: INITIAL  4.  Pt will ambulate >350' with RW mod I for improved household and short community distances. Baseline:  Goal status: INITIAL  5.  Pt will improve gait speed with RW to at least 1.3 ft/sec in order to demo improved community mobility.   Baseline:  .87 ft/sec with RW Goal status: INITIAL    LONG TERM GOALS: Target date: 06/16/2022    Pt will be independent with final HEP for strength,gait, balance in order to build upon functional gains made in therapy.  Baseline:  Goal status: INITIAL  2.  BERG goal to be written as appropriate in order to demo decr fall risk.  Baseline:  Goal status: INITIAL  3.  Pt will improve TUG time with RW vs. LRAD to 26 seconds or less in order to demo decrease fall risk.  Baseline: 33.32 seconds with RW  Goal status: INITIAL  4.  Pt will improve gait speed with RW vs. LRAD to at least 1.9 ft/sec in order to demo improved community mobility.  Baseline: .87 ft/sec with RW Goal status: INITIAL  5.  Pt will ambulate >500' with RW versus LRAD on paved surfaces with supervision for improved community mobility. Baseline:  Goal status: INITIAL   ASSESSMENT:  CLINICAL IMPRESSION: Patient is a 81 year old male referred to Neuro OPPT for PD, hx of CVA, hx of L hip fracture (01/2022).   Pt's PMH is significant for:  PD, HLD, OSA, DM, HTN, chronic diastolic HF, CVA, frequent falls, L2 compression fx, L hip fx. The following deficits were present during the exam: decr strength, impaired coordination, impaired balance, gait abnormalities, decr activity tolerance/endurance, postural abnormalities. Based on TUG, 5x sit <> stand, and gait  speed with RW, pt is an incr risk for falls. Pt would benefit from skilled PT to address these impairments and functional limitations to maximize functional mobility independence and decr fall risk.      OBJECTIVE IMPAIRMENTS Abnormal gait, decreased activity tolerance, decreased balance, decreased coordination, decreased endurance, decreased knowledge of use of DME, decreased mobility, decreased ROM, decreased strength, impaired flexibility, postural dysfunction, and pain.   ACTIVITY LIMITATIONS cleaning, community activity, driving, meal prep, laundry, yard work, and shopping.   PERSONAL FACTORS Age, Past/current experiences, Time since onset of injury/illness/exacerbation, and 3+ comorbidities:  PD, HLD, OSA, DM, HTN, chronic diastolic HF, CVA, frequent falls, L2 compression fx, L hip fx   are also affecting patient's functional outcome.    REHAB POTENTIAL: Good  CLINICAL DECISION MAKING: Evolving/moderate complexity  EVALUATION COMPLEXITY: Moderate  PLAN: PT FREQUENCY: 2x/week  PT DURATION: 12 weeks, plus eval  PLANNED INTERVENTIONS: Therapeutic exercises, Therapeutic activity, Neuromuscular re-education, Balance training, Gait training, Patient/Family education, Joint mobilization, Stair training, Vestibular training, Canalith repositioning, DME instructions, and Manual therapy  PLAN FOR NEXT SESSION: Perform BERG and update STG/LTG. Initial  HEP for balance based on BERG, functional strengthening, posture.    Arliss Journey, PT, DPT 04/22/2022, 9:59 AM

## 2022-04-23 ENCOUNTER — Ambulatory Visit: Payer: Medicare (Managed Care)

## 2022-04-23 ENCOUNTER — Encounter: Payer: Medicare (Managed Care) | Admitting: Occupational Therapy

## 2022-04-23 DIAGNOSIS — M6281 Muscle weakness (generalized): Secondary | ICD-10-CM

## 2022-04-23 DIAGNOSIS — R2681 Unsteadiness on feet: Secondary | ICD-10-CM

## 2022-04-23 DIAGNOSIS — R293 Abnormal posture: Secondary | ICD-10-CM

## 2022-04-23 DIAGNOSIS — R2689 Other abnormalities of gait and mobility: Secondary | ICD-10-CM | POA: Diagnosis not present

## 2022-04-23 NOTE — Therapy (Signed)
OUTPATIENT PHYSICAL THERAPY TREATMENT NOTE   Patient Name: Jordan [name] MRN: [id number] DOB:1940-11-06, 81 y.o., male Today's Date: 04/23/2022  PCP: Johny Blamer, MD REFERRING PROVIDER: Johny Blamer, MD  END OF SESSION:   PT End of Session - 04/23/22 1232     Visit Number 2    Number of Visits 17    Date for PT Re-Evaluation 07/21/22    Authorization Type [employer]    PT Start Time 1231    PT Stop Time 1316    PT Time Calculation (min) 45 min    Equipment Utilized During Treatment Gait belt    Activity Tolerance Patient tolerated treatment well    Behavior During Therapy WFL for tasks assessed/performed             Past Medical History:  Diagnosis Date   Arthritis    R shoulder, bone spur   Arthritis    "hands" (03/25/2016)   Chronic diastolic heart failure (HCC)    Chronic lower back pain    Coronary heart disease    Dr Garnette Scheuermann   H/O cardiovascular stress test    perhaps last one was 2009   Heart murmur    12/29/11 echo: mild MR, no AS, trivial TR   History of gout    Hyperlipidemia    Hypertension    saw Dr. Mendel Ryder last earl;y- 2014, cardiac cath. last done ?2009, blocks seen didn't require any intervention at that point.   Narcolepsy    [name] 04-11-97; Mean Latency 1.57min, SOREM 2   OSA on CPAP    NPSG 12-13-98 AHI 22.7   PONV (postoperative nausea and vomiting)    Presence of permanent cardiac pacemaker    Shortness of breath    with exertion    Stroke (HCC)    Type II diabetes mellitus (HCC)    Past Surgical History:  Procedure Laterality Date   CARDIAC CATHETERIZATION  2009?   EP IMPLANTABLE DEVICE N/A 03/25/2016   Procedure: Pacemaker Implant - Dual Chamber;  Surgeon: Marinus Maw, MD;  Location: [facility] INVASIVE CV LAB;  Service: Cardiovascular;  Laterality: N/A;   FRACTURE SURGERY     INSERT / REPLACE / REMOVE PACEMAKER  03/24/2016   INTRAMEDULLARY (IM) NAIL INTERTROCHANTERIC Left 02/08/2022   Procedure: INTRAMEDULLARY (IM) NAIL  INTERTROCHANTRIC;  Surgeon: Cammy Copa, MD;  Location: [facility] OR;  Service: Orthopedics;  Laterality: Left;   IR KYPHO LUMBAR INC FX REDUCE BONE BX UNI/BIL CANNULATION INC/IMAGING  12/29/2021   IR RADIOLOGIST EVAL & MGMT  12/15/2021   LEFT AND RIGHT HEART CATHETERIZATION WITH CORONARY ANGIOGRAM N/A 06/05/2014   Procedure: LEFT AND RIGHT HEART CATHETERIZATION WITH CORONARY ANGIOGRAM;  Surgeon: Lesleigh Noe, MD;  Location: [facility] CATH LAB;  Service: Cardiovascular;  Laterality: N/A;   NASAL FRACTURE SURGERY     SHOULDER ARTHROSCOPY WITH ROTATOR CUFF REPAIR AND SUBACROMIAL DECOMPRESSION Right 08/16/2013   Procedure: SHOULDER ARTHROSCOPY WITH ROTATOR CUFF REPAIR AND SUBACROMIAL DECOMPRESSION;  Surgeon: Mable Paris, MD;  Location: [facility] OR;  Service: Orthopedics;  Laterality: Right;  Right shoulder arthroscopy rotator cuff repair, subacromial decompression.   TONSILLECTOMY     Patient Active Problem List   Diagnosis Date Noted   Closed intertrochanteric fracture of left hip, initial encounter [facility])    Closed left hip fracture (HCC) secondary to fall at home 02/08/2022   Paroxysmal atrial fibrillation (HCC) 02/08/2022   Debility 01/08/2022   Constipation 01/07/2022   Physical deconditioning 01/07/2022   Acute metabolic  encephalopathy 01/04/2022   Hypomagnesemia 01/04/2022   Pancreatitis 12/30/2021   Parkinson's disease (HCC) 12/21/2021   Parkinson disease (HCC) 12/18/2021   Frequent falls 12/18/2021   History of CVA (cerebrovascular accident) 12/18/2021   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   Hallucinations, visual 07/30/2021   Acetabulum fracture, left (HCC) 06/25/2021   Acute hip pain, left 06/24/2021   Fall at home, initial encounter 06/24/2021   Leukocytosis 06/24/2021   Chronic diastolic heart failure (HCC)    Sinusitis, acute maxillary 06/24/2020   AKI (acute kidney injury) (HCC)    Labile blood glucose    Cough    Benign essential HTN    Skin lesion of hand     Hypoalbuminemia due to protein-calorie malnutrition (HCC)    Controlled type 2 diabetes mellitus with hyperglycemia, without long-term current use of insulin (HCC)    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   Morbid obesity (HCC)    Acute blood loss anemia    Acute thalamic infarction (HCC) 04/15/2020   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HCC) 05/25/2019   TIA (transient ischemic attack) 02/17/2018   Type 2 diabetes mellitus with hyperlipidemia (HCC) 02/17/2018   Insomnia 02/01/2018   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   Chronotropic incompetence 07/09/2014   Dyspnea on exertion 03/03/2014   HLD (hyperlipidemia) 12/13/2007   Essential hypertension 12/13/2007   Class 1 obesity 12/05/2007   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   Coronary atherosclerosis 12/05/2007    REFERRING DIAG: I63.81 (ICD-10-CM) - 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HCC) G20 (ICD-10-CM) - Parkinson's disease (HCC) S72.145S (ICD-10-CM) - Type III open nondisplaced intertrochanteric fracture of left femur, sequela    THERAPY DIAG:  Muscle weakness (generalized)  Unsteadiness on feet  Abnormal posture  Rationale for Evaluation and Treatment Rehabilitation  PERTINENT HISTORY: PMHx: PD (diagnosed Jan 2022), HLD, OSA, DM, HTN, chronic diastolic HF, CVA, frequent falls, L2 compression fx, L hip fx   PRECAUTIONS: Fall and ICD/Pacemaker, history of compression fracture at L2 with recent kyphoplasty   SUBJECTIVE: Patient reports doing well- no falls or near falls. Reports chronic LBP with longer standing bots.   PAIN:  Are you having pain? No  TODAY'S TREATMENT:   [facility] PT Assessment - 04/23/22 0001       Standardized Balance Assessment   Standardized Balance Assessment Berg Balance Test      Berg Balance Test   Sit to Stand Needs minimal aid to stand or to stabilize    Standing Unsupported Able to stand 30 seconds unsupported    Sitting with Back Unsupported but Feet Supported on Floor or Stool  Able to sit 2 minutes under supervision    Stand to Sit Controls descent by using hands    Transfers Needs one person to assist    Standing Unsupported with Eyes Closed Able to stand 10 seconds with supervision    Standing Unsupported with Feet Together Able to place feet together independently and stand for 1 minute with supervision    From Standing, Reach Forward with Outstretched Arm Reaches forward but needs supervision    From Standing Position, Pick up Object from Floor Unable to try/needs assist to keep balance    From Standing Position, Turn to Look Behind Over each Shoulder Needs supervision when turning    Turn 360 Degrees Needs assistance while turning    Standing Unsupported, Alternately Place Feet on Step/Stool Needs assistance to keep from falling or unable to try    Standing Unsupported, One Foot in Front Able to take small step independently  and hold 30 seconds    Standing on One Leg Unable to try or needs assist to prevent fall    Total Score 20            Therex:  -HEP, see below   PATIENT EDUCATION: Education details: HEP Person educated: Patient and Spouse Education method: Explanation Education comprehension: verbalized understanding     HOME EXERCISE PROGRAM: Access Code: [email or web address] URL: [email or web address] Date: 04/23/2022 Prepared by: Merry Lofty  Exercises - Clamshell  - 1 x daily - 7 x weekly - 3 sets - 10 reps - Seated Scapular Retraction  - 1 x daily - 7 x weekly - 3 sets - 10 reps - Seated Long Arc Quad  - 1 x daily - 7 x weekly - 3 sets - 10 reps - Seated Hamstring Stretch  - 1 x daily - 7 x weekly - 3 sets - 10 reps - Sit to Stand  - 1 x daily - 7 x weekly - 3 sets - 10 reps - Seated Heel Raise  - 1 x daily - 7 x weekly - 3 sets - 10 reps - Seated March  - 1 x daily - 7 x weekly - 3 sets - 10 reps - Seated Flexion Stretch with Swiss Ball  - 1 x daily - 7 x weekly - 3 sets - 10 reps          GOALS: Goals reviewed with  patient? Yes   SHORT TERM GOALS: Target date: 05/19/2022   Pt will be able to perform initial HEP for strengthening and balance to continue gains with supervision of wife. Baseline: Goal status: INITIAL   2.  BERG to be assessed with STG/LTG written.  Baseline:  Goal status: INITIAL   3.  Pt will improve 5x sit<>stand to less than or equal to 19 sec without UE support to demonstrate improved functional strength and transfer efficiency.   Baseline: 22.6 seconds without UE support from chair Goal status: INITIAL   4.  Pt will ambulate >350' with RW mod I for improved household and short community distances. Baseline:  Goal status: INITIAL   5.  Pt will improve gait speed with RW to at least 1.3 ft/sec in order to demo improved community mobility.    Baseline:  .87 ft/sec with RW Goal status: INITIAL       LONG TERM GOALS: Target date: 06/16/2022     Pt will be independent with final HEP for strength,gait, balance in order to build upon functional gains made in therapy.   Baseline:  Goal status: INITIAL   2.  BERG goal to be written as appropriate in order to demo decr fall risk.  Baseline:  Goal status: INITIAL   3.  Pt will improve TUG time with RW vs. LRAD to 26 seconds or less in order to demo decrease fall risk.   Baseline: 33.32 seconds with RW  Goal status: INITIAL   4.  Pt will improve gait speed with RW vs. LRAD to at least 1.9 ft/sec in order to demo improved community mobility.  Baseline: .87 ft/sec with RW Goal status: INITIAL   5.  Pt will ambulate >500' with RW versus LRAD on paved surfaces with supervision for improved community mobility. Baseline:  Goal status: INITIAL     ASSESSMENT:   CLINICAL IMPRESSION: Patient seen for skilled PT session with emphasis on balance assessment and functional LE strength. PT providing patient with HEP. Patient with significant functional LE weakness impacting his  ability to come to stand safely without physical  assist. Patient demonstrating safety with HEP, but would benefit from further functional strengthening and standing balance.    OBJECTIVE IMPAIRMENTS Abnormal gait, decreased activity tolerance, decreased balance, decreased coordination, decreased endurance, decreased knowledge of use of DME, decreased mobility, decreased ROM, decreased strength, impaired flexibility, postural dysfunction, and pain.    ACTIVITY LIMITATIONS cleaning, community activity, driving, meal prep, laundry, yard work, and shopping.    PERSONAL FACTORS Age, Past/current experiences, Time since onset of injury/illness/exacerbation, and 3+ comorbidities:  PD, HLD, OSA, DM, HTN, chronic diastolic HF, CVA, frequent falls, L2 compression fx, L hip fx   are also affecting patient's functional outcome.      REHAB POTENTIAL: Good   CLINICAL DECISION MAKING: Evolving/moderate complexity   EVALUATION COMPLEXITY: Moderate   PLAN: PT FREQUENCY: 2x/week   PT DURATION: 12 weeks, plus eval   PLANNED INTERVENTIONS: Therapeutic exercises, Therapeutic activity, Neuromuscular re-education, Balance training, Gait training, Patient/Family education, Joint mobilization, Stair training, Vestibular training, Canalith repositioning, DME instructions, and Manual therapy   PLAN FOR NEXT SESSION: posture, functional strength/endurance, add balance to HEP   Westley Foots, PT Westley Foots, PT, DPT, CBIS  04/23/2022, 1:18 PM

## 2022-04-28 ENCOUNTER — Encounter: Payer: Self-pay | Admitting: Occupational Therapy

## 2022-04-28 ENCOUNTER — Other Ambulatory Visit: Payer: Self-pay

## 2022-04-28 ENCOUNTER — Ambulatory Visit: Payer: Medicare (Managed Care)

## 2022-04-28 ENCOUNTER — Observation Stay (HOSPITAL_COMMUNITY)
Admission: EM | Admit: 2022-04-28 | Discharge: 2022-04-30 | Disposition: A | Discharge status: Home / Self Care | Payer: Medicare (Managed Care) | Attending: Internal Medicine | Admitting: Internal Medicine

## 2022-04-28 ENCOUNTER — Ambulatory Visit: Payer: Medicare (Managed Care) | Admitting: Occupational Therapy

## 2022-04-28 DIAGNOSIS — I5032 Chronic diastolic (congestive) heart failure: Secondary | ICD-10-CM | POA: Diagnosis not present

## 2022-04-28 DIAGNOSIS — Z7902 Long term (current) use of antithrombotics/antiplatelets: Secondary | ICD-10-CM | POA: Insufficient documentation

## 2022-04-28 DIAGNOSIS — I82402 Acute embolism and thrombosis of unspecified deep veins of left lower extremity: Principal | ICD-10-CM | POA: Insufficient documentation

## 2022-04-28 DIAGNOSIS — E1169 Type 2 diabetes mellitus with other specified complication: Secondary | ICD-10-CM | POA: Insufficient documentation

## 2022-04-28 DIAGNOSIS — Z87891 Personal history of nicotine dependence: Secondary | ICD-10-CM | POA: Insufficient documentation

## 2022-04-28 DIAGNOSIS — E66811 Obesity, class 1: Secondary | ICD-10-CM | POA: Diagnosis present

## 2022-04-28 DIAGNOSIS — I10 Essential (primary) hypertension: Secondary | ICD-10-CM | POA: Diagnosis present

## 2022-04-28 DIAGNOSIS — R41841 Cognitive communication deficit: Secondary | ICD-10-CM

## 2022-04-28 DIAGNOSIS — Z7982 Long term (current) use of aspirin: Secondary | ICD-10-CM | POA: Diagnosis not present

## 2022-04-28 DIAGNOSIS — I110 Hypertensive heart disease with heart failure: Secondary | ICD-10-CM | POA: Insufficient documentation

## 2022-04-28 DIAGNOSIS — W19XXXA Unspecified fall, initial encounter: Secondary | ICD-10-CM | POA: Insufficient documentation

## 2022-04-28 DIAGNOSIS — Z6832 Body mass index (BMI) 32.0-32.9, adult: Secondary | ICD-10-CM | POA: Insufficient documentation

## 2022-04-28 DIAGNOSIS — Z8673 Personal history of transient ischemic attack (TIA), and cerebral infarction without residual deficits: Secondary | ICD-10-CM

## 2022-04-28 DIAGNOSIS — Z7984 Long term (current) use of oral hypoglycemic drugs: Secondary | ICD-10-CM | POA: Diagnosis not present

## 2022-04-28 DIAGNOSIS — R278 Other lack of coordination: Secondary | ICD-10-CM

## 2022-04-28 DIAGNOSIS — G20 Parkinson's disease: Secondary | ICD-10-CM | POA: Insufficient documentation

## 2022-04-28 DIAGNOSIS — M7989 Other specified soft tissue disorders: Secondary | ICD-10-CM | POA: Diagnosis present

## 2022-04-28 DIAGNOSIS — E669 Obesity, unspecified: Secondary | ICD-10-CM | POA: Insufficient documentation

## 2022-04-28 DIAGNOSIS — R41840 Attention and concentration deficit: Secondary | ICD-10-CM

## 2022-04-28 DIAGNOSIS — J9811 Atelectasis: Secondary | ICD-10-CM | POA: Diagnosis not present

## 2022-04-28 DIAGNOSIS — R600 Localized edema: Secondary | ICD-10-CM | POA: Diagnosis not present

## 2022-04-28 DIAGNOSIS — R2681 Unsteadiness on feet: Secondary | ICD-10-CM

## 2022-04-28 DIAGNOSIS — E785 Hyperlipidemia, unspecified: Secondary | ICD-10-CM | POA: Diagnosis present

## 2022-04-28 DIAGNOSIS — Z950 Presence of cardiac pacemaker: Secondary | ICD-10-CM | POA: Diagnosis not present

## 2022-04-28 DIAGNOSIS — S72002A Fracture of unspecified part of neck of left femur, initial encounter for closed fracture: Secondary | ICD-10-CM | POA: Diagnosis not present

## 2022-04-28 DIAGNOSIS — Z86718 Personal history of other venous thrombosis and embolism: Secondary | ICD-10-CM | POA: Diagnosis present

## 2022-04-28 DIAGNOSIS — G20A1 Parkinson's disease without dyskinesia, without mention of fluctuations: Secondary | ICD-10-CM | POA: Diagnosis present

## 2022-04-28 DIAGNOSIS — R609 Edema, unspecified: Secondary | ICD-10-CM | POA: Diagnosis not present

## 2022-04-28 DIAGNOSIS — R2689 Other abnormalities of gait and mobility: Secondary | ICD-10-CM | POA: Diagnosis not present

## 2022-04-28 DIAGNOSIS — I2510 Atherosclerotic heart disease of native coronary artery without angina pectoris: Secondary | ICD-10-CM | POA: Diagnosis not present

## 2022-04-28 DIAGNOSIS — R41844 Frontal lobe and executive function deficit: Secondary | ICD-10-CM

## 2022-04-28 DIAGNOSIS — Z79899 Other long term (current) drug therapy: Secondary | ICD-10-CM | POA: Insufficient documentation

## 2022-04-28 DIAGNOSIS — R293 Abnormal posture: Secondary | ICD-10-CM

## 2022-04-28 DIAGNOSIS — R471 Dysarthria and anarthria: Secondary | ICD-10-CM

## 2022-04-28 DIAGNOSIS — R251 Tremor, unspecified: Secondary | ICD-10-CM

## 2022-04-28 DIAGNOSIS — M6281 Muscle weakness (generalized): Secondary | ICD-10-CM

## 2022-04-28 LAB — COMPREHENSIVE METABOLIC PANEL
ALT: 18 U/L (ref 0–44)
AST: 27 U/L (ref 15–41)
Albumin: 3.7 g/dL (ref 3.5–5.0)
Alkaline Phosphatase: 82 U/L (ref 38–126)
Anion gap: 14 (ref 5–15)
BUN: 27 mg/dL — ABNORMAL HIGH (ref 8–23)
CO2: 19 mmol/L — ABNORMAL LOW (ref 22–32)
Calcium: 9.6 mg/dL (ref 8.9–10.3)
Chloride: 104 mmol/L (ref 98–111)
Creatinine, Ser: 1.12 mg/dL (ref 0.61–1.24)
GFR, Estimated: >60 mL/min (ref >60)
Glucose, Bld: 232 mg/dL — ABNORMAL HIGH (ref 70–99)
Potassium: 4.6 mmol/L (ref 3.5–5.1)
Sodium: 137 mmol/L (ref 135–145)
Total Bilirubin: 0.2 mg/dL — ABNORMAL LOW (ref 0.3–1.2)
Total Protein: 7.2 g/dL (ref 6.5–8.1)

## 2022-04-28 LAB — CBC WITH DIFFERENTIAL/PLATELET
Abs Immature Granulocytes: 0.05 10*3/uL (ref 0.00–0.07)
Basophils Absolute: 0.1 10*3/uL (ref 0.0–0.1)
Basophils Relative: 1 %
Eosinophils Absolute: 0.4 10*3/uL (ref 0.0–0.5)
Eosinophils Relative: 3 %
HCT: 46.3 % (ref 39.0–52.0)
Hemoglobin: 14.7 g/dL (ref 13.0–17.0)
Immature Granulocytes: 0 %
Lymphocytes Relative: 19 %
Lymphs Abs: 2.6 10*3/uL (ref 0.7–4.0)
MCH: 28.6 pg (ref 26.0–34.0)
MCHC: 31.7 g/dL (ref 30.0–36.0)
MCV: 90.1 fL (ref 80.0–100.0)
Monocytes Absolute: 1 10*3/uL (ref 0.1–1.0)
Monocytes Relative: 7 %
Neutro Abs: 9.5 10*3/uL — ABNORMAL HIGH (ref 1.7–7.7)
Neutrophils Relative %: 70 %
Platelets: 253 10*3/uL (ref 150–400)
RBC: 5.14 MIL/uL (ref 4.22–5.81)
RDW: 13.8 % (ref 11.5–15.5)
WBC: 13.6 10*3/uL — ABNORMAL HIGH (ref 4.0–10.5)
nRBC: 0 % (ref 0.0–0.2)

## 2022-04-28 LAB — TROPONIN I (HIGH SENSITIVITY): Troponin I (High Sensitivity): 8 ng/L (ref <18)

## 2022-04-28 NOTE — Patient Instructions (Signed)
Coordination Exercises  Perform the following exercises for 20 minutes 1 times per day. Perform with both hand(s). Perform using big movements.  Flipping Cards: Place deck of cards on the table. Flip cards over by opening your hand big to grasp and then turn your palm up big. Deal cards: Hold 1/2 or whole deck in your hand. Use thumb to push card off top of deck with one big push. Pick up coins and place in coin bank or container: Pick up with big, intentional movements. Do not drag coin to the edge. Pick up coins and stack one at a time: Pick up with big, intentional movements. Do not drag coin to the edge. (5-10 in a stack) Pick up 5-10 coins one at a time and hold in palm. Then, move coins from palm to fingertips one at time and place in coin bank/container. Practice writing: Slow down, write big, and focus on forming each letter.

## 2022-04-28 NOTE — ED Provider Notes (Signed)
[facility] EMERGENCY DEPARTMENT Provider Note   CSN: [id number] Arrival date & time: 04/28/22  2214     History  Chief Complaint  Jordan [name] presents with   Leg Swelling    Jordan [name] is a 81 y.o. male.  81 year old male who presents the ER today secondary to swelling and discoloration of his left leg.  Jordan [name] was his normal state of health.  Is able to do physical therapy occupational therapy today without any problems has been improving significantly.  Jordan [name] states that sometime around supper Jordan [name] got up to walk and could not walk related to the pain in his left leg.  States that it is mostly in his left lower calf.  Jordan [name] states that Jordan [name] was walking independently now can even walk with a walker.  Was not like that earlier in the day.  No pain at rest.  No chest pain or shortness of breath.  Soundly Jordan [name] had hip surgery in April and then some dental surgery this month was off Plavix for a few days prior to that.  Jordan [name] is not on any anticoagulation.  No history of DVT or other thromboembolism.        Home Medications Prior to Admission medications   Medication Sig Start Date End Date Taking? Authorizing Provider  acetaminophen (TYLENOL) 325 MG tablet Take 1-2 tablets (325-650 mg total) by mouth every 4 (four) hours as needed for mild pain. Jordan [name] taking differently: Take 650 mg by mouth every 4 (four) hours as needed for mild pain. 01/18/22   Love, Ivan Anchors, PA-C  amantadine (SYMMETREL) 100 MG capsule Take 1 capsule (100 mg total) by mouth daily. 01/19/22   Love, Ivan Anchors, PA-C  aspirin 81 MG chewable tablet Chew 1 tablet (81 mg total) by mouth daily. Jordan [name] not taking: Reported on 04/19/2022 02/12/22   Meredith Pel, MD  atorvastatin (LIPITOR) 40 MG tablet Take 0.5 tablets (20 mg total) by mouth daily. 07/30/21   Medina-Vargas, Monina C, NP  bifidobacterium infantis (ALIGN) capsule Take 1 capsule by mouth daily. Jordan [name] not taking: Reported on 04/19/2022     [provider]  Camphor-Menthol-Methyl Sal (HM SALONPAS PAIN RELIEF EX) Apply 1 patch topically daily as needed (pain). Jordan [name] not taking: Reported on 04/19/2022    [provider]  Carbidopa-Levodopa ER (SINEMET CR) 25-100 MG tablet controlled release TAKE 1 TABLET BY MOUTH EVERYDAY AT BEDTIME 03/10/22   Frann Rider, NP  cholecalciferol (VITAMIN D3) 25 MCG (1000 UNIT) tablet Take 1,000 Units by mouth daily.    [provider]  clopidogrel (PLAVIX) 75 MG tablet Take 1 tablet (75 mg total) by mouth daily. 01/18/22 01/18/23  Love, Ivan Anchors, PA-C  docusate sodium (COLACE) 100 MG capsule Take 1 capsule (100 mg total) by mouth 2 (two) times daily. Jordan [name] not taking: Reported on 04/19/2022 02/17/22   Hosie Poisson, MD  feeding supplement, GLUCERNA SHAKE, (GLUCERNA SHAKE) LIQD Take 237 mLs by mouth 3 (three) times daily between meals. Jordan [name] not taking: Reported on 04/19/2022 02/17/22 05/18/22  Hosie Poisson, MD  furosemide (LASIX) 20 MG tablet USE AS NEEDED FOR UP TO 5 DAYS IF SWELLING IN LEGS RECURS. CONTACT CARDIOLOGY IS SWELLING DOES NOT IMPROVE OR GETS WORSE. Jordan [name] not taking: Reported on 04/19/2022 01/26/22   Izora Ribas, MD  lidocaine (LIDODERM) 5 % Place 1 patch onto the skin daily as needed. Purchase over the counter. On for 12 hours and off for 12 hours 01/18/22   Love, Ivan Anchors, PA-C  magnesium gluconate (MAGONATE) 500 MG tablet Take 0.5 tablets (250 mg total) by mouth at bedtime. 03/24/22   Raulkar, Clide Deutscher, MD  Melatonin 10 MG TABS Take 10 mg by mouth at bedtime.    [provider]  metFORMIN (GLUCOPHAGE) 1000 MG tablet Take 1 tablet (1,000 mg total) by mouth daily with breakfast. 01/18/22   Love, Ivan Anchors, PA-C  metoprolol tartrate (LOPRESSOR) 25 MG tablet Take 1 tablet (25 mg total) by mouth 2 (two) times daily. Jordan [name] not taking: Reported on 04/19/2022 07/30/21   Medina-Vargas, Monina C, NP  Multiple Vitamin (MULTIVITAMIN WITH MINERALS) TABS tablet  Take 1 tablet by mouth daily. 02/19/18   Georgette Shell, MD  NITROSTAT 0.4 MG SL tablet PLACE 1 TABLET (0.4 MG TOTAL) UNDER THE TONGUE EVERY 5 (FIVE) MINUTES AS NEEDED FOR CHEST PAIN. Jordan [name] taking differently: Place 0.4 mg under the tongue every 5 (five) minutes as needed for chest pain. 03/31/16   Belva Crome, MD  Omega 3 1200 MG CAPS Take 1,200 mg by mouth daily.    [provider]  oxyCODONE (OXY IR/ROXICODONE) 5 MG immediate release tablet Take 1 tablet (5 mg total) by mouth every 6 (six) hours as needed for moderate pain (pain score 4-6). Jordan [name] not taking: Reported on 04/19/2022 02/12/22   Meredith Pel, MD  polyethylene glycol (MIRALAX / GLYCOLAX) 17 g packet Take 17 g by mouth daily. 01/09/22   Antonieta Pert, MD  polyvinyl alcohol (LIQUIFILM TEARS) 1.4 % ophthalmic solution Place 1 drop into both eyes as needed for dry eyes. 01/18/22   Bary Leriche, PA-C  PRESCRIPTION MEDICATION See admin instructions. CPAP- At bedtime    [provider]  QUEtiapine (SEROQUEL) 25 MG tablet TAKE 1/2 TABLETS (12.5 MG TOTAL) BY MOUTH AT BEDTIME. 03/01/22   Raulkar, Clide Deutscher, MD  senna-docusate (SENOKOT-S) 8.6-50 MG tablet Take 2 tablets by mouth daily with supper. 01/18/22   Love, Ivan Anchors, PA-C  Vitamin D, Ergocalciferol, (DRISDOL) 1.25 MG (50000 UNIT) CAPS capsule Take 1 capsule (50,000 Units total) by mouth every 7 (seven) days. 01/18/22   Love, Ivan Anchors, PA-C  Wheat Dextrin (BENEFIBER PO) Take 10 mLs by mouth daily. Jordan [name] not taking: Reported on 04/19/2022    [provider]      Allergies    Jordan [name] has no known allergies.    Review of Systems   Review of Systems  Physical Exam Updated Vital Signs BP 111/74 (BP Location: Right Arm)   Pulse 70   Temp 98.5 F (36.9 C) (Oral)   Resp 17   Ht '5\' 9"'$  (1.753 m)   Wt 99.8 kg   SpO2 92%   BMI 32.49 kg/m  Physical Exam Vitals and nursing note reviewed.  Constitutional:      Appearance: Jordan [name] is well-developed.   HENT:     Head: Normocephalic and atraumatic.     Mouth/Throat:     Mouth: Mucous membranes are moist.     Pharynx: Oropharynx is clear.  Eyes:     Pupils: Pupils are equal, round, and reactive to light.  Cardiovascular:     Rate and Rhythm: Normal rate.     Comments: Left DP pulse intact, right DP pulse intact.  Not able to manually palpate PT pulse. Pulmonary:     Effort: Pulmonary effort is normal. No respiratory distress.  Abdominal:     General: There is no distension.  Musculoskeletal:        General: Normal range of motion.  Cervical back: Normal range of motion.  Skin:    Findings: Erythema (Left leg is erythematous and mildly edematous compared to the right.) present.  Neurological:     General: No focal deficit present.     Mental Status: Jordan [name] is alert.     ED Results / Procedures / Treatments   Labs (all labs ordered are listed, but only abnormal results are displayed) Labs Reviewed  CBC WITH DIFFERENTIAL/PLATELET  COMPREHENSIVE METABOLIC PANEL  BRAIN NATRIURETIC PEPTIDE  TROPONIN I (HIGH SENSITIVITY)    EKG None  Radiology No results found.  Procedures Procedures    Medications Ordered in ED Medications - No data to display  ED Course/ Medical Decision Making/ A&P                           Medical Decision Making Amount and/or Complexity of Data Reviewed Labs: ordered. Radiology: ordered. ECG/medicine tests: ordered.  Risk OTC drugs. Prescription drug management. Decision regarding hospitalization.  Signs symptoms consistent with likely DVT.  Not able to get out ultrasound right now to confirm it but Jordan [name] is highly hypoxic without any other reason to be so.  We will get a CT scan of his chest and check basic labs.  If the stuff is normal I will see reason why Jordan [name] needs to be admitted for this I will likely start Jordan [name] on anticoagulation have Jordan [name] come back tomorrow to get a confirmatory study and follow-up with his doctors. Jordan [name] get his  DVT study now. Not able to ambulate more than a couple steps 2/2 pain and weakness which is new. D/w hospitalist for admission. Will start xarelto.    Final Clinical Impression(s) / ED Diagnoses Final diagnoses:  None    Rx / DC Orders ED Discharge Orders     None         Lemya [place], Corene Cornea, MD 04/30/22 [phone]

## 2022-04-28 NOTE — Therapy (Signed)
OUTPATIENT SPEECH LANGUAGE PATHOLOGY TREATMENT NOTE   Patient Name: Jordan [name] MRN: [id number] DOB:1941-02-28, 81 y.o., male Today's Date: 04/28/2022  PCP: [name] MD REFERRING PROVIDER: Izora Ribas, MD   END OF SESSION:   End of Session - 04/28/22 1306     Visit Number 2    Number of Visits 25    Date for SLP Re-Evaluation 07/16/22    Authorization Type [employer]  $20 co pay per day    SLP Start Time 1403    SLP Stop Time  1445    SLP Time Calculation (min) 42 min    Activity Tolerance Patient tolerated treatment well             Past Medical History:  Diagnosis Date   Arthritis    R shoulder, bone spur   Arthritis    "hands" (03/25/2016)   Chronic diastolic heart failure (HCC)    Chronic lower back pain    Coronary heart disease    Dr Pernell Dupre   H/O cardiovascular stress test    perhaps last one was 2009   Heart murmur    12/29/11 echo: mild MR, no AS, trivial TR   History of gout    Hyperlipidemia    Hypertension    saw Dr. Linard Millers last earl;y- 2014, cardiac cath. last done ?2009, blocks seen didn't require any intervention at that point.   Narcolepsy    [name] 04-11-97; Mean Latency 1.6mn, SOREM 2   OSA on CPAP    NPSG 12-13-98 AHI 22.7   PONV (postoperative nausea and vomiting)    Presence of permanent cardiac pacemaker    Shortness of breath    with exertion    Stroke ([place]    Type II diabetes mellitus ([place]    Past Surgical History:  Procedure Laterality Date   CARDIAC CATHETERIZATION  2009?   EP IMPLANTABLE DEVICE N/A 03/25/2016   Procedure: Pacemaker Implant - Dual Chamber;  Surgeon: GEvans Lance MD;  [facility];  Service: Cardiovascular;  Laterality: N/A;   FRACTURE SURGERY     INSERT / REPLACE / REMOVE PACEMAKER  03/24/2016   INTRAMEDULLARY (IM) NAIL INTERTROCHANTERIC Left 02/08/2022   Procedure: INTRAMEDULLARY (IM) NAIL INTERTROCHANTRIC;  Surgeon: DMeredith Pel MD;  [facility]  Service:  Orthopedics;  Laterality: Left;   IR KYPHO LUMBAR INC FX REDUCE BONE BX UNI/BIL CANNULATION INC/IMAGING  12/29/2021   IR RADIOLOGIST EVAL & MGMT  12/15/2021   LEFT AND RIGHT HEART CATHETERIZATION WITH CORONARY ANGIOGRAM N/A 06/05/2014   Procedure: LEFT AND RIGHT HEART CATHETERIZATION WITH CORONARY ANGIOGRAM;  Surgeon: HSinclair Grooms MD;  [facility];  Service: Cardiovascular;  Laterality: N/A;   NASAL FRACTURE SURGERY     SHOULDER ARTHROSCOPY WITH ROTATOR CUFF REPAIR AND SUBACROMIAL DECOMPRESSION Right 08/16/2013   Procedure: SHOULDER ARTHROSCOPY WITH ROTATOR CUFF REPAIR AND SUBACROMIAL DECOMPRESSION;  Surgeon: JNita Sells MD;  [facility]  Service: Orthopedics;  Laterality: Right;  Right shoulder arthroscopy rotator cuff repair, subacromial decompression.   TONSILLECTOMY     Patient Active Problem List   Diagnosis Date Noted   Closed intertrochanteric fracture of left hip, initial encounter [facility]    Closed left hip fracture ([place] [place] secondary to fall at home 02/08/2022   Paroxysmal atrial fibrillation ([place] 02/08/2022   Debility 01/08/2022   Constipation 01/07/2022   Physical deconditioning 057/32/2025  Acute metabolic encephalopathy 042/70/6237  Hypomagnesemia 01/04/2022   Pancreatitis 12/30/2021  Parkinson's disease ([place]) 12/21/2021   Parkinson disease ([place]) 12/18/2021   Frequent falls 12/18/2021   History of CVA (cerebrovascular accident) 12/18/2021   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   Hallucinations, visual 07/30/2021   Acetabulum fracture, left ([place]) 06/25/2021   Acute hip pain, left 06/24/2021   Fall at home, initial encounter 06/24/2021   Leukocytosis 06/24/2021   Chronic diastolic heart failure ([place])    Sinusitis, acute maxillary 06/24/2020   AKI (acute kidney injury) ([place])    Labile blood glucose    Cough    Benign essential HTN    Skin lesion of hand    Hypoalbuminemia due to protein-calorie malnutrition ([place])    Controlled type 2 diabetes  mellitus with hyperglycemia, without long-term current use of insulin (HCC)    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   Morbid obesity ([place])    Acute blood loss anemia    Acute thalamic infarction ([place]) 04/15/2020   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   TIA (transient ischemic attack) 02/17/2018   Type 2 diabetes mellitus with hyperlipidemia ([place]) 02/17/2018   Insomnia 02/01/2018   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   Chronotropic incompetence 07/09/2014   Dyspnea on exertion 03/03/2014   HLD (hyperlipidemia) 12/13/2007   Essential hypertension 12/13/2007   Class 1 obesity 12/05/2007   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 No   OBJECTIVE:     TODAY'S TREATMENT:  04-28-22: Re-educated rationale and principles of Speak Out! Program for hypokinetic dysarthria. Targeted improving vocal quality and increasing intensity through progressively difficulty speech tasks using Speak Out! program, lesson 1. ST leads pt through exercises providing usual model prior to pt execution. Occasional min to mod-A required to achieve target dB this date. Averages this date: loud "ah" 89 dB (~8 seconds); reading 75 dB; cognitive speech task 75 dB. Intermittent side comments required usual cues to optimize vocal intensity from mid 60s dB to low 70s dB.  04-21-22: Educated patient and wife on evaluation results and clinical observations. Both verbalized understanding and agreement with POC. Provided handout with Speak Out! Information to request free workbook.       PATIENT EDUCATION: Education details: see above Person educated: Patient and Spouse Education method: Explanation, Demonstration, and Handouts Education comprehension: verbalized understanding, returned demonstration, and needs further education     HOME EXERCISE PROGRAM: Order Speak Out! book     GOALS: Goals reviewed with patient? Yes   SHORT TERM GOALS: Target date: 05/19/2022     Pt will complete Speak Out! HEP at least 1x/day given occasional min A over 2 sessions  Baseline: Goal status: ongoing   2.  Pt will achieve targeted dB (85-90 dB) on warm up exercises with 80% accuracy given occasional min A over 2 sessions  Baseline:  Goal status: ongoing   3.  Pt will achieve targeted dB (75-85 dB) on reading exercises with 80% accuracy given occasional min A over 2 sessions Baseline:  Goal status: ongoing   4.  Pt will achieve targeted dB (72-78 dB) on cognitive exercises with 80% accuracy given occasional min A over 2 sessions Baseline:  Goal status: ongoing   5.  Pt will utilize dysarthria compensations in 5-10 minute conversation to optimize vocal intensity and clarity given occasional mod A over 2 sessions  Baseline:  Goal status: ongoing   6.  Pt will implement memory/attention compensations to aid daily functioning given occasional mod A over 2 sessions  Baseline:  Goal status: ongoing   LONG TERM GOALS: Target date: 07/16/2022    Pt will complete Speak Out! HEP at least 1x/day (BID recommended) > 1 week  Baseline:  Goal status: ongoing   2.  Pt will achieve targeted dB levels in demonstration of Speak Out! Lessons with 90% accuracy given rare min A over 2 sessions  Baseline:  Goal status: ongoing   3.  Pt will utilize dysarthria compensations in 15+ minute conversation to optimize vocal intensity and clarity given occasional min A over 2 sessions Baseline:  Goal status: ongoing   4.  Pt will implement memory/attention compensations to aid daily  functioning given occasional min A over 2 sessions  Baseline:  Goal status: ongoing   5.  Pt will report improved communication effectiveness via PROM by 2 point improvement by last ST session  Baseline: CPIB=26 Goal status: ongoing   ASSESSMENT:   CLINICAL IMPRESSION: Patient is a 81 y.o. male who was seen today for previous thalamic stroke and Parkinson's disease. 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 Conducted education and training of Speak Out! Program to optimize vocal intensity and clarity. Skilled ST is warranted to address dysarthria and cognition to maximize communication effectiveness and functional independence.    OBJECTIVE IMPAIRMENTS  Objective impairments include attention, memory, and dysarthria. These impairments are limiting patient from household responsibilities and effectively communicating at home and in community.Factors affecting potential to achieve goals and functional outcome are medical prognosis and previous level of function. Patient will benefit from skilled SLP services to address above impairments and improve overall function.   REHAB POTENTIAL: Good   PLAN: SLP FREQUENCY: 2x/week   SLP DURATION: 12 weeks   PLANNED INTERVENTIONS: Cueing hierachy, Cognitive reorganization, Internal/external aids, Functional tasks, Multimodal communication approach, SLP instruction and feedback, Compensatory strategies, and Patient/family education  Marzetta Board, CCC-SLP 04/28/2022, 4:50 PM

## 2022-04-28 NOTE — ED Triage Notes (Signed)
[facility] from [facility] c/o swelling and discoloration to L leg the was just noticed today. Pt previously had L hip surgery in April. Pt states he hasn't missed any PT and denies pain unless moving. VSS, A&Ox4 HX- CVA, DM

## 2022-04-28 NOTE — Therapy (Signed)
OUTPATIENT OCCUPATIONAL THERAPY PARKINSON'S Treatment  Patient Name: Jordan [name] MRN: [id number] DOB:Jul 23, 1941, 81 y.o., male Today's Date: 04/28/2022  PCP: Dr. Kenton Kingfisher REFERRING PROVIDER: Dr. Ranell Patrick   OT End of Session - 04/28/22 1327     Visit Number 2    Number of Visits 24    Date for OT Re-Evaluation 07/15/22    Authorization Type [employer] Medicare    Authorization - Visit Number 2    Authorization - Number of Visits 10    OT Start Time 1320    OT Stop Time 1400    OT Time Calculation (min) 40 min    Activity Tolerance Patient tolerated treatment well    Behavior During Therapy [facility] for tasks assessed/performed             Past Medical History:  Diagnosis Date   Arthritis    R shoulder, bone spur   Arthritis    "hands" (03/25/2016)   Chronic diastolic heart failure (HCC)    Chronic lower back pain    Coronary heart disease    Dr Pernell Dupre   H/O cardiovascular stress test    perhaps last one was 2009   Heart murmur    12/29/11 echo: mild MR, no AS, trivial TR   History of gout    Hyperlipidemia    Hypertension    saw Dr. Linard Millers last earl;y- 2014, cardiac cath. last done ?2009, blocks seen didn't require any intervention at that point.   Narcolepsy    [name] 04-11-97; Mean Latency 1.60mn, SOREM 2   OSA on CPAP    NPSG 12-13-98 AHI 22.7   PONV (postoperative nausea and vomiting)    Presence of permanent cardiac pacemaker    Shortness of breath    with exertion    Stroke ([place]    Type II diabetes mellitus ([place]    Past Surgical History:  Procedure Laterality Date   CARDIAC CATHETERIZATION  2009?   EP IMPLANTABLE DEVICE N/A 03/25/2016   Procedure: Pacemaker Implant - Dual Chamber;  Surgeon: GEvans Lance MD;  [facility];  Service: Cardiovascular;  Laterality: N/A;   FRACTURE SURGERY     INSERT / REPLACE / REMOVE PACEMAKER  03/24/2016   INTRAMEDULLARY (IM) NAIL INTERTROCHANTERIC Left 02/08/2022   Procedure: INTRAMEDULLARY (IM) NAIL  INTERTROCHANTRIC;  Surgeon: DMeredith Pel MD;  [facility]  Service: Orthopedics;  Laterality: Left;   IR KYPHO LUMBAR INC FX REDUCE BONE BX UNI/BIL CANNULATION INC/IMAGING  12/29/2021   IR RADIOLOGIST EVAL & MGMT  12/15/2021   LEFT AND RIGHT HEART CATHETERIZATION WITH CORONARY ANGIOGRAM N/A 06/05/2014   Procedure: LEFT AND RIGHT HEART CATHETERIZATION WITH CORONARY ANGIOGRAM;  Surgeon: HSinclair Grooms MD;  [facility];  Service: Cardiovascular;  Laterality: N/A;   NASAL FRACTURE SURGERY     SHOULDER ARTHROSCOPY WITH ROTATOR CUFF REPAIR AND SUBACROMIAL DECOMPRESSION Right 08/16/2013   Procedure: SHOULDER ARTHROSCOPY WITH ROTATOR CUFF REPAIR AND SUBACROMIAL DECOMPRESSION;  Surgeon: JNita Sells MD;  Location: [facility]  Service: Orthopedics;  Laterality: Right;  Right shoulder arthroscopy rotator cuff repair, subacromial decompression.   TONSILLECTOMY     Patient Active Problem List   Diagnosis Date Noted   Closed intertrochanteric fracture of left hip, initial encounter ([facility]    Closed left hip fracture ([place] secondary to fall at home 02/08/2022   Paroxysmal atrial fibrillation ([place] 02/08/2022   Debility 01/08/2022   Constipation 01/07/2022   Physical deconditioning 01/07/2022   Acute  metabolic encephalopathy 30/16/0109   Hypomagnesemia 01/04/2022   Pancreatitis 12/30/2021   Parkinson's disease ([place]) 12/21/2021   Parkinson disease ([place]) 12/18/2021   Frequent falls 12/18/2021   History of CVA (cerebrovascular accident) 12/18/2021   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   Hallucinations, visual 07/30/2021   Acetabulum fracture, left ([place]) 06/25/2021   Acute hip pain, left 06/24/2021   Fall at home, initial encounter 06/24/2021   Leukocytosis 06/24/2021   Chronic diastolic heart failure ([place])    Sinusitis, acute maxillary 06/24/2020   AKI (acute kidney injury) ([place])    Labile blood glucose    Cough    Benign essential HTN    Skin lesion of hand     Hypoalbuminemia due to protein-calorie malnutrition ([place])    Controlled type 2 diabetes mellitus with hyperglycemia, without long-term current use of insulin (HCC)    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   Morbid obesity ([place])    Acute blood loss anemia    Acute thalamic infarction ([place]) 04/15/2020   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[place]) 05/25/2019   TIA (transient ischemic attack) 02/17/2018   Type 2 diabetes mellitus with hyperlipidemia ([place]) 02/17/2018   Insomnia 02/01/2018   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   Chronotropic incompetence 07/09/2014   Dyspnea on exertion 03/03/2014   HLD (hyperlipidemia) 12/13/2007   Essential hypertension 12/13/2007   Class 1 obesity 12/05/2007   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   Coronary atherosclerosis 12/05/2007    ONSET DATE: 04/19/22  REFERRING DIAG: thalamic CVA I63.81  THERAPY DIAG:  Muscle weakness (generalized)  Unsteadiness on feet  Abnormal posture  Other lack of coordination  Attention and concentration deficit  Frontal lobe and executive function deficit  Tremor  Rationale for Evaluation and Treatment Rehabilitation  SUBJECTIVE:   SUBJECTIVE STATEMENT: Denies pain   PERTINENT HISTORY: Jordan [name] is an 81 year old male with history of CAD/chronic diastolic CHF, OSA, N2TF, gout, Parkinson's diease with recent hospital admission 02/17-02-22-23 worsening of gait disorder with falls with hallucinations which was treated with medication adjustment and he was discharged to home. He was readmitted on 12/30/21 with abdominal pain with low grade fever, leucocytosis, lactic acidosis, mild confusion and lethargy secondary to sepsis. He was found to have elevated lipase-165  with inflammatory changes suggestive of primary pancreatitis with secondary inflammation of hepatic flexure of colon or primary sigmoid diverticulitis with secondary extension to pancreatic tail. He was treated with  IVF and broad spectrum antibiotics.   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 Pt fell and sustained hip fx on 02/08/22 and pt underwent left  IM nailing.   Pt accompanied by: self and significant other, spouse, [name]    PRECAUTIONS: Fall and Other: , hx of back kyphoplasty  , pacemaker, hx of L hip fx, s/p IM nailing  WEIGHT BEARING RESTRICTIONS No  PAIN:  Are you having pain? Yes: NPRS scale: 3-4/10 Pain location: back Pain description: aching Aggravating factors: standing Relieving factors: repositioning  FALLS: Has patient fallen in last 6 months? Yes. Number of falls 2-3   LIVING ENVIRONMENT: Lives with: lives with their spouse Lives in: House/apartment, condo Stairs: No Has following equipment at home: Gilford Rile - 2 wheeled  PLOF: Independent prior to CVA in 2021  PATIENT GOALS to be more independent, pt's wife states he would like to be able to drive  OBJECTIVE:  Grip strength : RUE 73.6, LUE 63.9  Shoulder ROM sh. Flexion: RUE 95, LUE 110, elbow extension R -20, L -15 MMT, RUE prox 3+/5, distal 4/5, LUE 4/5  TODAY'S TREATMENT:  Therapist completed UE assessment and added goals. Pt was educated in coordination for bilateral UE's, he returned demonstration with min/ mod  and demonstration v.c   PATIENT EDUCATION: Education details: coordination HEP Person educated: Patient Education method: Explanation, demonstration, verbal cues, handout Education comprehension: verbalized understanding, returned demonstration   HOME EXERCISE PROGRAM: coordination      GOALS: Goals reviewed with patient? No  SHORT TERM GOALS: Target date: 05/26/2022    I with initial HEP Baseline: Goal status: INITIAL  2.  Pt will verbalize understanding of adapted strategies to maximize safety and I with ADLs/ IADLs (VF:IEPPIRJ food) Baseline:  Goal status: INITIAL  3.  Pt will demonstrate improved UE  functional use as evidenced by increasing LUE box/ blocks score by 3 blocks. Baseline: RUE 45, LUE 37 Goal status: inital  4.  Pt will perform LB dressing with min A demonstrating good safety awareness. Baseline: mod-max A Goal status: INITIAL  5.  Pt will consistently donn pullover shirt mod I in a reasonable amount of time Baseline: increased time required and prn min assist Goal status: INITIAL  6.   Pt will verbalize understanding of ways to prevent future PD related complications and PD community resources.  Goal status: INITIAL  7.. Pt will retrieve items from overhead shelf at 95* with right UE with -15 elbow extension Baseline: 95, -20 Goal status: INITIAL  8. Pt will retrieve items from overhead shelf at 110 with right UE with -10 elbow extension Baseline: 110, -15 Goal status: INITIAL  LONG TERM GOALS: Target date: 07/21/2022    Pt will demonstrate understanding of memory compensations and ways to keep thinking skills sharp  Goal status: INITIAL  2.  Pt will demonstrate improved fine motor coordination for ADLs as evidenced by decreasing 9 hole peg test score for bilateral UE's by 3 secs Baseline: RUE 42.47, LUE 42.25 Goal status: INITIAL  3.  Pt will demonstrate improved ease with feeding as evidenced by decreasing PPT#2 (self feeding) by 3 secs Baseline: 16.66 secs Goal status: INITIAL  4.  Pt will demonstrate improved ease with fastening buttons as evidenced by decreasing 3 button/ unbutton time to : 95 secs Baseline: 1 min 45 secs Goal status: INITIAL  5.  Pt will write a short paragraph with 100% legibility and no significant decrease in letter size Baseline: mild micrographia Goal status: INITIAL  6.  Assess test PPT#4 and set goal prn Goal status: INITIAL   ASSESSMENT:  CLINICAL IMPRESSION: Pt is progressing towards goals. He demonstrates understanding of initial HEP for coordination   PERFORMANCE DEFICITS in functional skills including ADLs,  IADLs, coordination, dexterity, sensation, ROM, strength, pain, flexibility, FMC, GMC, mobility, balance, endurance, decreased knowledge of precautions, decreased knowledge of use of DME, UE functional use, and bradykinesia, cognitive skills including attention, learn, memory, problem solving, safety awareness, and thought, and psychosocial skills including coping strategies, environmental adaptation, habits, interpersonal interactions, and routines and behaviors.   IMPAIRMENTS are limiting patient from ADLs, IADLs, rest and sleep, play, leisure, and social participation.   COMORBIDITIES may have co-morbidities  that affects occupational performance. Patient will benefit from skilled OT to address above impairments and improve overall function.  MODIFICATION OR ASSISTANCE TO COMPLETE EVALUATION: Min-Moderate modification of tasks or assist with assess necessary to complete an evaluation.  OT OCCUPATIONAL PROFILE AND HISTORY: Detailed assessment: Review of  records and additional review of physical, cognitive, psychosocial history related to current functional performance.  CLINICAL DECISION MAKING: Moderate - several treatment options, min-mod task modification necessary  REHAB POTENTIAL: Good  EVALUATION COMPLEXITY: Moderate       PLAN: OT FREQUENCY: 2x/week  OT DURATION: 12 weeks plus eval  PLANNED INTERVENTIONS: self care/ADL training, therapeutic exercise, therapeutic activity, neuromuscular re-education, manual therapy, passive range of motion, balance training, functional mobility training, aquatic therapy, ultrasound, paraffin, fluidotherapy, moist heat, cryotherapy, contrast bath, patient/family education, cognitive remediation/compensation, visual/perceptual remediation/compensation, energy conservation, coping strategies training, and DME and/or AE instructions  RECOMMENDED OTHER SERVICES: n/a  CONSULTED AND AGREED WITH PLAN OF CARE: Patient and family member/caregiver  PLAN  FOR NEXT SESSION:  consider cane exercises, limited shoulder ROM with hx of dislocations. ADL/ big movement strategies   Deivi Huckins, OT 04/28/2022, 1:28 PM  Theone Murdoch, OTR/L [phone] Phone: [phone] 1:28 PM 04/28/22

## 2022-04-28 NOTE — Patient Instructions (Signed)
SPEAK OUT! is a structured program targeting voice in patient's with Parkinson's. This program was developed by [employer]. It is evidence based and based on principles of motor learning. The nonprofit provides free materials to patients being treated by a Speak Out! certified SLP.    www.parkinsonvoiceproject.org for more information   Learn about Parkinson's webinar: PokerProtocol.cz -- highly recommend watching this webinar!!   Order your stimulus booklet: [phone] Your provider: Leta Speller SLP, Parkin  This book is free!! They do offer a "pay if forward" option where you can donate to the non-profit organization so they can continue serving the Parkinson's population and providing these valuable resources to patients.

## 2022-04-29 ENCOUNTER — Observation Stay (HOSPITAL_COMMUNITY): Payer: Medicare (Managed Care)

## 2022-04-29 ENCOUNTER — Ambulatory Visit: Payer: Medicare (Managed Care) | Admitting: Neurology

## 2022-04-29 ENCOUNTER — Emergency Department (HOSPITAL_COMMUNITY): Payer: Medicare (Managed Care)

## 2022-04-29 ENCOUNTER — Encounter (HOSPITAL_COMMUNITY): Payer: Medicare (Managed Care)

## 2022-04-29 ENCOUNTER — Observation Stay (HOSPITAL_BASED_OUTPATIENT_CLINIC_OR_DEPARTMENT_OTHER): Payer: Medicare (Managed Care)

## 2022-04-29 ENCOUNTER — Ambulatory Visit (INDEPENDENT_AMBULATORY_CARE_PROVIDER_SITE_OTHER): Payer: Medicare (Managed Care)

## 2022-04-29 DIAGNOSIS — I5032 Chronic diastolic (congestive) heart failure: Secondary | ICD-10-CM | POA: Diagnosis not present

## 2022-04-29 DIAGNOSIS — I82402 Acute embolism and thrombosis of unspecified deep veins of left lower extremity: Secondary | ICD-10-CM | POA: Diagnosis present

## 2022-04-29 DIAGNOSIS — I824Y2 Acute embolism and thrombosis of unspecified deep veins of left proximal lower extremity: Secondary | ICD-10-CM | POA: Diagnosis not present

## 2022-04-29 DIAGNOSIS — Z86718 Personal history of other venous thrombosis and embolism: Secondary | ICD-10-CM | POA: Diagnosis present

## 2022-04-29 DIAGNOSIS — M7989 Other specified soft tissue disorders: Secondary | ICD-10-CM | POA: Diagnosis not present

## 2022-04-29 DIAGNOSIS — J9811 Atelectasis: Secondary | ICD-10-CM | POA: Diagnosis not present

## 2022-04-29 LAB — COMPREHENSIVE METABOLIC PANEL
ALT: 13 U/L (ref 0–44)
AST: 21 U/L (ref 15–41)
Albumin: 3.9 g/dL (ref 3.5–5.0)
Alkaline Phosphatase: 88 U/L (ref 38–126)
Anion gap: 11 (ref 5–15)
BUN: 27 mg/dL — ABNORMAL HIGH (ref 8–23)
CO2: 24 mmol/L (ref 22–32)
Calcium: 9.8 mg/dL (ref 8.9–10.3)
Chloride: 104 mmol/L (ref 98–111)
Creatinine, Ser: 1.1 mg/dL (ref 0.61–1.24)
GFR, Estimated: >60 mL/min (ref >60)
Glucose, Bld: 173 mg/dL — ABNORMAL HIGH (ref 70–99)
Potassium: 4.2 mmol/L (ref 3.5–5.1)
Sodium: 139 mmol/L (ref 135–145)
Total Bilirubin: 1 mg/dL (ref 0.3–1.2)
Total Protein: 7.2 g/dL (ref 6.5–8.1)

## 2022-04-29 LAB — CUP PACEART REMOTE DEVICE CHECK
Battery Remaining Percentage: 55 %
Brady Statistic RA Percent Paced: 97 %
Brady Statistic RV Percent Paced: 17 %
Date Time Interrogation Session: 20230628075122
Implantable Lead Implant Date: 20170525
Implantable Lead Implant Date: 20170525
Implantable Lead Location: 753859
Implantable Lead Location: 753860
Implantable Lead Model: 377
Implantable Lead Model: 377
Implantable Lead Serial Number: 49470224
Implantable Lead Serial Number: 49485613
Implantable Pulse Generator Implant Date: 20170525
Lead Channel Impedance Value: 468 Ohm
Lead Channel Impedance Value: 527 Ohm
Lead Channel Pacing Threshold Amplitude: 1 V
Lead Channel Pacing Threshold Amplitude: 1.7 V
Lead Channel Pacing Threshold Pulse Width: 0.4 ms
Lead Channel Pacing Threshold Pulse Width: 0.4 ms
Lead Channel Sensing Intrinsic Amplitude: 1.9 mV
Lead Channel Sensing Intrinsic Amplitude: 8 mV
Lead Channel Setting Pacing Amplitude: 2.4 V
Lead Channel Setting Pacing Amplitude: 2.4 V
Lead Channel Setting Pacing Pulse Width: 0.4 ms
Pulse Gen Model: 394969
Pulse Gen Serial Number: 68798589

## 2022-04-29 LAB — LIPID PANEL
Cholesterol: 123 mg/dL (ref 0–200)
HDL: 44 mg/dL (ref >40)
LDL Cholesterol: 51 mg/dL (ref 0–99)
Total CHOL/HDL Ratio: 2.8 RATIO
Triglycerides: 141 mg/dL (ref <150)
VLDL: 28 mg/dL (ref 0–40)

## 2022-04-29 LAB — CBC WITH DIFFERENTIAL/PLATELET
Abs Immature Granulocytes: 0.05 10*3/uL (ref 0.00–0.07)
Basophils Absolute: 0.1 10*3/uL (ref 0.0–0.1)
Basophils Relative: 1 %
Eosinophils Absolute: 0.4 10*3/uL (ref 0.0–0.5)
Eosinophils Relative: 3 %
HCT: 43.2 % (ref 39.0–52.0)
Hemoglobin: 14.8 g/dL (ref 13.0–17.0)
Immature Granulocytes: 0 %
Lymphocytes Relative: 16 %
Lymphs Abs: 2.2 10*3/uL (ref 0.7–4.0)
MCH: 29.6 pg (ref 26.0–34.0)
MCHC: 34.3 g/dL (ref 30.0–36.0)
MCV: 86.4 fL (ref 80.0–100.0)
Monocytes Absolute: 1 10*3/uL (ref 0.1–1.0)
Monocytes Relative: 7 %
Neutro Abs: 10.2 10*3/uL — ABNORMAL HIGH (ref 1.7–7.7)
Neutrophils Relative %: 73 %
Platelets: 235 10*3/uL (ref 150–400)
RBC: 5 MIL/uL (ref 4.22–5.81)
RDW: 13.9 % (ref 11.5–15.5)
WBC: 13.9 10*3/uL — ABNORMAL HIGH (ref 4.0–10.5)
nRBC: 0 % (ref 0.0–0.2)

## 2022-04-29 LAB — TROPONIN I (HIGH SENSITIVITY): Troponin I (High Sensitivity): 6 ng/L (ref <18)

## 2022-04-29 LAB — PROTIME-INR
INR: 2.2 — ABNORMAL HIGH (ref 0.8–1.2)
Prothrombin Time: 23.9 seconds — ABNORMAL HIGH (ref 11.4–15.2)

## 2022-04-29 LAB — GLUCOSE, CAPILLARY
Glucose-Capillary: 186 mg/dL — ABNORMAL HIGH (ref 70–99)
Glucose-Capillary: 248 mg/dL — ABNORMAL HIGH (ref 70–99)
Glucose-Capillary: 188 mg/dL — ABNORMAL HIGH (ref 70–99)

## 2022-04-29 LAB — D-DIMER, QUANTITATIVE: D-Dimer, Quant: 4.48 ug/mL-FEU — ABNORMAL HIGH (ref 0.00–0.50)

## 2022-04-29 LAB — APTT: aPTT: 39 seconds — ABNORMAL HIGH (ref 24–36)

## 2022-04-29 LAB — BRAIN NATRIURETIC PEPTIDE: B Natriuretic Peptide: 39.4 pg/mL (ref 0.0–100.0)

## 2022-04-29 LAB — HEPARIN LEVEL (UNFRACTIONATED): Heparin Unfractionated: >1.1 IU/mL — ABNORMAL HIGH (ref 0.30–0.70)

## 2022-04-29 LAB — C-REACTIVE PROTEIN: CRP: 0.9 mg/dL (ref <1.0)

## 2022-04-29 MED ORDER — METHOCARBAMOL 1000 MG/10ML IJ SOLN: 500.0000 mg | Freq: Four times a day (QID) | INTRAVENOUS | Status: DC | PRN

## 2022-04-29 MED ORDER — POLYETHYLENE GLYCOL 3350 17 G PO PACK
17.0000 g | PACK | Freq: Every day | ORAL | Status: DC
  Administered 2022-04-30: 17 g via ORAL
  Filled 2022-04-29 (×2): qty 1

## 2022-04-29 MED ORDER — OXYCODONE HCL 5 MG PO TABS: 5.0000 mg | ORAL_TABLET | Freq: Four times a day (QID) | ORAL | Status: DC | PRN

## 2022-04-29 MED ORDER — ASPIRIN 81 MG PO CHEW: 81.0000 mg | CHEWABLE_TABLET | Freq: Every day | ORAL | Status: DC

## 2022-04-29 MED ORDER — ENOXAPARIN SODIUM 100 MG/ML IJ SOSY: 100.0000 mg | PREFILLED_SYRINGE | Freq: Two times a day (BID) | INTRAMUSCULAR | Status: DC

## 2022-04-29 MED ORDER — INSULIN ASPART 100 UNIT/ML IJ SOLN: 0.0000 [IU] | Freq: Every day | INTRAMUSCULAR | Status: DC

## 2022-04-29 MED ORDER — AMANTADINE HCL 100 MG PO CAPS
100.0000 mg | ORAL_CAPSULE | Freq: Every day | ORAL | Status: DC
  Administered 2022-04-29 – 2022-04-30 (×2): 100 mg via ORAL
  Filled 2022-04-29 (×2): qty 1

## 2022-04-29 MED ORDER — ENOXAPARIN SODIUM 100 MG/ML IJ SOSY
100.0000 mg | PREFILLED_SYRINGE | Freq: Two times a day (BID) | INTRAMUSCULAR | Status: DC
  Administered 2022-04-29 – 2022-04-30 (×2): 100 mg via SUBCUTANEOUS
  Filled 2022-04-29 (×2): qty 1

## 2022-04-29 MED ORDER — ACETAMINOPHEN 325 MG PO TABS: 650.0000 mg | ORAL_TABLET | Freq: Four times a day (QID) | ORAL | Status: DC | PRN

## 2022-04-29 MED ORDER — INSULIN ASPART 100 UNIT/ML IJ SOLN
0.0000 [IU] | INTRAMUSCULAR | Status: DC
  Administered 2022-04-29: 2 [IU] via SUBCUTANEOUS
  Administered 2022-04-29: 3 [IU] via SUBCUTANEOUS

## 2022-04-29 MED ORDER — RIVAROXABAN 15 MG PO TABS
15.0000 mg | ORAL_TABLET | Freq: Once | ORAL | Status: DC
  Filled 2022-04-29: qty 1

## 2022-04-29 MED ORDER — ATORVASTATIN CALCIUM 10 MG PO TABS
20.0000 mg | ORAL_TABLET | Freq: Every day | ORAL | Status: DC
  Administered 2022-04-29 – 2022-04-30 (×2): 20 mg via ORAL
  Filled 2022-04-29 (×2): qty 2

## 2022-04-29 MED ORDER — DOCUSATE SODIUM 100 MG PO CAPS
100.0000 mg | ORAL_CAPSULE | Freq: Two times a day (BID) | ORAL | Status: DC
  Administered 2022-04-29 – 2022-04-30 (×3): 100 mg via ORAL
  Filled 2022-04-29 (×3): qty 1

## 2022-04-29 MED ORDER — ACETAMINOPHEN 650 MG RE SUPP: 650.0000 mg | Freq: Four times a day (QID) | RECTAL | Status: DC | PRN

## 2022-04-29 MED ORDER — QUETIAPINE 12.5 MG HALF TABLET
12.5000 mg | ORAL_TABLET | Freq: Every day | ORAL | Status: DC
  Administered 2022-04-29: 12.5 mg via ORAL
  Filled 2022-04-29 (×2): qty 1

## 2022-04-29 MED ORDER — SODIUM CHLORIDE 0.9 % IV SOLN: INTRAVENOUS | Status: DC

## 2022-04-29 MED ORDER — ONDANSETRON HCL 4 MG/2ML IJ SOLN: 4.0000 mg | Freq: Four times a day (QID) | INTRAMUSCULAR | Status: DC | PRN

## 2022-04-29 MED ORDER — GLUCERNA SHAKE PO LIQD
237.0000 mL | Freq: Three times a day (TID) | ORAL | Status: DC
  Administered 2022-04-29 – 2022-04-30 (×4): 237 mL via ORAL

## 2022-04-29 MED ORDER — ACETAMINOPHEN 500 MG PO TABS
1000.0000 mg | ORAL_TABLET | Freq: Once | ORAL | Status: AC
  Administered 2022-04-29: 1000 mg via ORAL
  Filled 2022-04-29: qty 2

## 2022-04-29 MED ORDER — POLYVINYL ALCOHOL 1.4 % OP SOLN
1.0000 [drp] | OPHTHALMIC | Status: DC | PRN
Start: 2022-04-29 — End: 2022-05-01

## 2022-04-29 MED ORDER — INSULIN ASPART 100 UNIT/ML IJ SOLN
0.0000 [IU] | Freq: Three times a day (TID) | INTRAMUSCULAR | Status: DC
  Administered 2022-04-30 (×2): 3 [IU] via SUBCUTANEOUS
  Administered 2022-04-30: 5 [IU] via SUBCUTANEOUS

## 2022-04-29 MED ORDER — ONDANSETRON HCL 4 MG PO TABS: 4.0000 mg | ORAL_TABLET | Freq: Four times a day (QID) | ORAL | Status: DC | PRN

## 2022-04-29 MED ORDER — RIVAROXABAN 15 MG PO TABS
15.0000 mg | ORAL_TABLET | Freq: Two times a day (BID) | ORAL | Status: DC
  Administered 2022-04-29: 15 mg via ORAL
  Filled 2022-04-29: qty 1

## 2022-04-29 MED ORDER — ENOXAPARIN SODIUM 60 MG/0.6ML IJ SOSY: 60.0000 mg | PREFILLED_SYRINGE | Freq: Two times a day (BID) | INTRAMUSCULAR | Status: DC

## 2022-04-29 MED ORDER — CARBIDOPA-LEVODOPA ER 25-100 MG PO TBCR
1.0000 | EXTENDED_RELEASE_TABLET | Freq: Every day | ORAL | Status: DC
  Administered 2022-04-29: 1 via ORAL
  Filled 2022-04-29 (×2): qty 1

## 2022-04-29 MED ORDER — RIVAROXABAN 20 MG PO TABS: 20.0000 mg | ORAL_TABLET | Freq: Every day | ORAL | Status: DC

## 2022-04-29 MED ORDER — INSULIN ASPART 100 UNIT/ML IJ SOLN: 0.0000 [IU] | Freq: Three times a day (TID) | INTRAMUSCULAR | Status: DC

## 2022-04-29 MED ORDER — IOHEXOL 350 MG/ML SOLN
100.0000 mL | Freq: Once | INTRAVENOUS | Status: AC | PRN
  Administered 2022-04-29: 65 mL via INTRAVENOUS

## 2022-04-29 NOTE — ED Notes (Signed)
Patient transported to CT 

## 2022-04-29 NOTE — Progress Notes (Signed)
Left LE venous duplex study completed. Critical results given to Berle Mull MD. Please see CV Proc for preliminary results.  Carrson Lightcap BS, RVT 04/29/2022 10:23 AM

## 2022-04-29 NOTE — Progress Notes (Signed)
Pt placed himself on his home CPAP unit. He is familiar and comfortable doing this on his own.

## 2022-04-29 NOTE — H&P (Signed)
History and Physical  Patient: Jordan [name] JKK:[id number] DOB: Jan 21, 1941 DOA: 04/28/2022 DOS: the patient was seen and examined on 04/29/2022 Patient coming from: Home  Chief Complaint:  Chief Complaint  Patient presents with   Leg Swelling   HPI: Jordan [name] is a 81 y.o. male with medical history significant of HTN, HFpEF, CAD, bradycardia SP PPM implant, type II DM, Parkinson's disease, OSA, gout, narcolepsy, recent hip fracture treated in April 2023. Patient was working with physical therapy and Occupational Therapy outpatient after recent hospitalization for hip fracture. Presented to ER on 6/28 with sudden onset of left-sided leg pain and swelling. Pain was severe enough that the patient was not able to ambulate. Denies any fall trauma or injury. Patient may change that he is compliant with all medications at home. No fever no chills.  No nausea no vomiting. Denies any bleeding episode prior to this admission. After the hip surgery patient was on aspirin and Plavix for DVT prophylaxis.  Review of Systems: As mentioned in the history of present illness. All other systems reviewed and are negative. Past Medical History:  Diagnosis Date   Arthritis    R shoulder, bone spur   Arthritis    "hands" (03/25/2016)   Chronic diastolic heart failure (HCC)    Chronic lower back pain    Coronary heart disease    Dr Pernell Dupre   H/O cardiovascular stress test    perhaps last one was 2009   Heart murmur    12/29/11 echo: mild MR, no AS, trivial TR   History of gout    Hyperlipidemia    Hypertension    saw Dr. Linard Millers last earl;y- 2014, cardiac cath. last done ?2009, blocks seen didn't require any intervention at that point.   Narcolepsy    [name] 04-11-97; Mean Latency 1.76mn, SOREM 2   OSA on CPAP    NPSG 12-13-98 AHI 22.7   PONV (postoperative nausea and vomiting)    Presence of permanent cardiac pacemaker    Shortness of breath    with exertion    Stroke ([place]    Type II  diabetes mellitus ([place]    Past Surgical History:  Procedure Laterality Date   CARDIAC CATHETERIZATION  2009?   EP IMPLANTABLE DEVICE N/A 03/25/2016   Procedure: Pacemaker Implant - Dual Chamber;  Surgeon: GEvans Lance MD;  [facility];  Service: Cardiovascular;  Laterality: N/A;   FRACTURE SURGERY     INSERT / REPLACE / REMOVE PACEMAKER  03/24/2016   INTRAMEDULLARY (IM) NAIL INTERTROCHANTERIC Left 02/08/2022   Procedure: INTRAMEDULLARY (IM) NAIL INTERTROCHANTRIC;  Surgeon: DMeredith Pel MD;  [place] [facility]  Service: Orthopedics;  Laterality: Left;   IR KYPHO LUMBAR INC FX REDUCE BONE BX UNI/BIL CANNULATION INC/IMAGING  12/29/2021   IR RADIOLOGIST EVAL & MGMT  12/15/2021   LEFT AND RIGHT HEART CATHETERIZATION WITH CORONARY ANGIOGRAM N/A 06/05/2014   Procedure: LEFT AND RIGHT HEART CATHETERIZATION WITH CORONARY ANGIOGRAM;  Surgeon: HSinclair Grooms MD;  [facility];  Service: Cardiovascular;  Laterality: N/A;   NASAL FRACTURE SURGERY     SHOULDER ARTHROSCOPY WITH ROTATOR CUFF REPAIR AND SUBACROMIAL DECOMPRESSION Right 08/16/2013   Procedure: SHOULDER ARTHROSCOPY WITH ROTATOR CUFF REPAIR AND SUBACROMIAL DECOMPRESSION;  Surgeon: JNita Sells MD;  [facility] [facility]  Service: Orthopedics;  Laterality: Right;  Right shoulder arthroscopy rotator cuff repair, subacromial decompression.   TONSILLECTOMY     Social History:  reports that he quit  smoking about 54 years ago. His smoking use included cigarettes. He has a 15.00 pack-year smoking history. He has never used smokeless tobacco. He reports current alcohol use. He reports that he does not use drugs. No Known Allergies Family History  Problem Relation Age of Onset   Sleep apnea Sister    Colon cancer Sister    Prior to Admission medications   Medication Sig Start Date End Date Taking? Authorizing Provider  acetaminophen (TYLENOL) 325 MG tablet Take 1-2 tablets (325-650 mg total) by mouth every 4  (four) hours as needed for mild pain. Patient taking differently: Take 650 mg by mouth every 4 (four) hours as needed for mild pain. 01/18/22  Yes Love, Ivan Anchors, PA-C  amantadine (SYMMETREL) 100 MG capsule Take 1 capsule (100 mg total) by mouth daily. Patient taking differently: Take 200 mg by mouth daily. 01/19/22  Yes Love, Ivan Anchors, PA-C  atorvastatin (LIPITOR) 40 MG tablet Take 0.5 tablets (20 mg total) by mouth daily. 07/30/21  Yes Medina-Vargas, Monina C, NP  bifidobacterium infantis (ALIGN) capsule Take 1 capsule by mouth daily.   Yes [provider]  Camphor-Menthol-Methyl Sal (HM SALONPAS PAIN RELIEF EX) Apply 1 patch topically daily as needed (pain).   Yes [provider]  Carbidopa-Levodopa ER (SINEMET CR) 25-100 MG tablet controlled release TAKE 1 TABLET BY MOUTH EVERYDAY AT BEDTIME Patient taking differently: Take 1 tablet by mouth at bedtime. 03/10/22  Yes McCue, [name], NP  cholecalciferol (VITAMIN D3) 25 MCG (1000 UNIT) tablet Take 1,000 Units by mouth daily.   Yes [provider]  clopidogrel (PLAVIX) 75 MG tablet Take 1 tablet (75 mg total) by mouth daily. 01/18/22 01/18/23 Yes Love, Ivan Anchors, PA-C  docusate sodium (COLACE) 100 MG capsule Take 1 capsule (100 mg total) by mouth 2 (two) times daily. 02/17/22  Yes Hosie Poisson, MD  Ensure Plus (ENSURE PLUS) LIQD Take 237 mLs by mouth every other day.   Yes [provider]  lidocaine (LIDODERM) 5 % Place 1 patch onto the skin daily as needed. Purchase over the counter. On for 12 hours and off for 12 hours 01/18/22  Yes Love, Ivan Anchors, PA-C  magnesium gluconate (MAGONATE) 500 MG tablet Take 0.5 tablets (250 mg total) by mouth at bedtime. 03/24/22  Yes Raulkar, Clide Deutscher, MD  Melatonin 10 MG TABS Take 10 mg by mouth at bedtime.   Yes [provider]  metFORMIN (GLUCOPHAGE) 1000 MG tablet Take 1 tablet (1,000 mg total) by mouth daily with breakfast. Patient taking differently: Take 500 mg by mouth  daily with breakfast. 01/18/22  Yes Love, Ivan Anchors, PA-C  metoprolol tartrate (LOPRESSOR) 25 MG tablet Take 1 tablet (25 mg total) by mouth 2 (two) times daily. 07/30/21  Yes Medina-Vargas, Monina C, NP  Multiple Vitamin (MULTIVITAMIN WITH MINERALS) TABS tablet Take 1 tablet by mouth daily. 02/19/18  Yes Georgette Shell, MD  NITROSTAT 0.4 MG SL tablet PLACE 1 TABLET (0.4 MG TOTAL) UNDER THE TONGUE EVERY 5 (FIVE) MINUTES AS NEEDED FOR CHEST PAIN. Patient taking differently: Place 0.4 mg under the tongue every 5 (five) minutes as needed for chest pain. 03/31/16  Yes Belva Crome, MD  Omega 3 1200 MG CAPS Take 1,200 mg by mouth daily.   Yes [provider]  polyethylene glycol (MIRALAX / GLYCOLAX) 17 g packet Take 17 g by mouth daily. 01/09/22  Yes Antonieta Pert, MD  polyvinyl alcohol (LIQUIFILM TEARS) 1.4 % ophthalmic solution Place 1 drop into both eyes  as needed for dry eyes. 01/18/22  Yes Love, Ivan Anchors, PA-C  QUEtiapine (SEROQUEL) 25 MG tablet TAKE 1/2 TABLETS (12.5 MG TOTAL) BY MOUTH AT BEDTIME. 03/01/22  Yes Raulkar, Clide Deutscher, MD  Vitamin D, Ergocalciferol, (DRISDOL) 1.25 MG (50000 UNIT) CAPS capsule Take 1 capsule (50,000 Units total) by mouth every 7 (seven) days. 01/18/22  Yes Love, Ivan Anchors, PA-C  Wheat Dextrin (BENEFIBER PO) Take 10 mLs by mouth daily.   Yes [provider]  aspirin 81 MG chewable tablet Chew 1 tablet (81 mg total) by mouth daily. Patient not taking: Reported on 04/19/2022 02/12/22   Meredith Pel, MD  feeding supplement, GLUCERNA SHAKE, (GLUCERNA SHAKE) LIQD Take 237 mLs by mouth 3 (three) times daily between meals. Patient not taking: Reported on 04/19/2022 02/17/22 05/18/22  Hosie Poisson, MD  furosemide (LASIX) 20 MG tablet USE AS NEEDED FOR UP TO 5 DAYS IF SWELLING IN LEGS RECURS. CONTACT CARDIOLOGY IS SWELLING DOES NOT IMPROVE OR GETS WORSE. Patient not taking: Reported on 04/19/2022 01/26/22   Raulkar, Clide Deutscher, MD  oxyCODONE (OXY IR/ROXICODONE) 5 MG  immediate release tablet Take 1 tablet (5 mg total) by mouth every 6 (six) hours as needed for moderate pain (pain score 4-6). Patient not taking: Reported on 04/19/2022 02/12/22   Meredith Pel, MD  senna-docusate (SENOKOT-S) 8.6-50 MG tablet Take 2 tablets by mouth daily with supper. Patient not taking: Reported on 04/29/2022 01/18/22   Flora Lipps   Physical Exam: Vitals:   04/29/22 0515 04/29/22 1115 04/29/22 1716 04/29/22 1949  BP: 114/61 114/77 121/71 123/82  Pulse: 78 74 72 73  Resp: '16 17 17 '$ (!) 24  Temp: 98.9 F (37.2 C) 98.2 F (36.8 C) 98.4 F (36.9 C) 98 F (36.7 C)  TempSrc: Oral Oral Oral Oral  SpO2: 93% 92% 94% 93%  Weight:      Height:       General: Appear in moderate distress; no visible Abnormal Neck Mass Or lumps, Conjunctiva normal Cardiovascular: S1 and S2 Present, no Murmur, Respiratory: good respiratory effort, Bilateral Air entry present and CTA, no Crackles, no wheezes Abdomen: Bowel Sound present, Non tender  Extremities: left Pedal edema Neurology: alert and oriented to time, place, and person Gait not checked due to patient safety concerns     Data Reviewed: I have Reviewed nursing notes, Vitals, and Lab results since pt's last encounter. Pertinent lab results CBC and CMP I have ordered test including CBC BMP I have ordered imaging studies lower extremity Doppler venous and arterial, ordered ABI but was canceled. I have discussed pt's care plan and test results with vascular surgery.   Assessment and Plan:   Acute deep vein thrombosis (DVT) of left lower extremity (HCC) Appreciate vascular surgery follow-up. Patient is currently on Lovenox. Patient per vascular surgery is not a good candidate for any interventional procedure as of right now. We will monitor progression. We will transition to oral anticoagulation when vascular surgery recommends.    Closed left hip fracture ([place]) secondary to fall at home Continue PT OT.     Chronic diastolic heart failure (HCC) Volume status adequate. Monitor. Actually receiving IV fluids for now.    Essential hypertension Blood pressure soft. Will monitor.  History of CVA (cerebrovascular accident) On aspirin and Plavix. We will continue.    HLD (hyperlipidemia) Continue statin.  Type 2 diabetes mellitus with hyperlipidemia (HCC) With hyperglycemia.  Will check hemoglobin A1c.  Without long-term insulin use. Continue sliding scale insulin.  Parkinson's disease ([place]) Continue Sinemet.  Presence of permanent cardiac pacemaker Monitor for now.  Class 1 obesity Body mass index is 32.49 kg/m.  Placing the patient at high risk for poor outcome.  Advance Care Planning:   Code Status: Full Code  Consults: Vascular surgery Family Communication: None at bedside Severity of Illness: The appropriate patient status for this patient is OBSERVATION. Observation status is judged to be reasonable and necessary in order to provide the required intensity of service to ensure the patient's safety. The patient's presenting symptoms, physical exam findings, and initial radiographic and laboratory data in the context of their medical condition is felt to place them at decreased risk for further clinical deterioration. Furthermore, it is anticipated that the patient will be medically stable for discharge from the hospital within 2 midnights of admission.   Author: Berle Mull, MD 04/29/2022 7:57 PM For on call review [email or web address].

## 2022-04-29 NOTE — Progress Notes (Signed)
ANTICOAGULATION CONSULT NOTE - Initial Consult  Pharmacy Consult for Xarelto  Indication:  Rule out DVT  No Known Allergies  Patient Measurements: Height: '5\' 9"'$  (175.3 cm) Weight: 99.8 kg (220 lb) IBW/kg (Calculated) : 70.7  Vital Signs: Temp: 98.9 F (37.2 C) (06/29 0515) Temp Source: Oral (06/29 0515) BP: 114/61 (06/29 0515) Pulse Rate: 78 (06/29 0515)  Labs: Recent Labs    04/28/22 2256 04/29/22 0109  HGB 14.7  --   HCT 46.3  --   PLT 253  --   CREATININE 1.12  --   TROPONINIHS 8 6    Estimated Creatinine Clearance: 59.2 mL/min (by C-G formula based on SCr of 1.12 mg/dL).   Medical History: Past Medical History:  Diagnosis Date   Arthritis    R shoulder, bone spur   Arthritis    "hands" (03/25/2016)   Chronic diastolic heart failure (HCC)    Chronic lower back pain    Coronary heart disease    Dr Pernell Dupre   H/O cardiovascular stress test    perhaps last one was 2009   Heart murmur    12/29/11 echo: mild MR, no AS, trivial TR   History of gout    Hyperlipidemia    Hypertension    saw Dr. Linard Millers last earl;y- 2014, cardiac cath. last done ?2009, blocks seen didn't require any intervention at that point.   Narcolepsy    [name] 04-11-97; Mean Latency 1.30mn, SOREM 2   OSA on CPAP    NPSG 12-13-98 AHI 22.7   PONV (postoperative nausea and vomiting)    Presence of permanent cardiac pacemaker    Shortness of breath    with exertion    Stroke ([place]    Type II diabetes mellitus ([place]      Assessment: 81y/o M to start Xarelto for rule out DVT while awaiting venous duplex. CT is negative for PE. CBC/renal function good. Recent ortho surgery a few months ago.   Goal of Therapy:  Monitor platelets by anticoagulation protocol: Yes   Plan:  -Xarelto 15 mg BID x 21 days, then 20 mg daily with supper -If DVT work-up is negative, DC Xarelto -Daily CBC  -Monitor for bleeding  JNarda Bonds PharmD, BCPS Clinical Pharmacist Phone: [phone]

## 2022-04-29 NOTE — TOC Initial Note (Signed)
Transition of Care [facility]) - Initial/Assessment Note    Patient Details  Name: Jordan [name] MRN: [id number] Date of Birth: 06-Oct-1941  Transition of [facility] CM/SW Contact:    Marilu Favre, RN Phone Number: 04/29/2022, 3:26 PM  Clinical Narrative:                 Spoke to patient at bedside. [place] home with wife. Patient has CPAP ( wife will bring in today , nurse aware), and walker.   Patient [facility] [address] [facility] therapy.   Confirmed face sheet information.   PCP is Dr [name]   Expected Discharge Plan: Home/Self Care Barriers to Discharge: Continued Medical Work up   Patient Goals and CMS Choice Patient states their goals for this hospitalization and ongoing recovery are:: to return to home CMS Medicare.gov Compare Post Acute Care list provided to:: Patient    Expected Discharge Plan and Services Expected Discharge Plan: Home/Self Care   Discharge Planning Services: CM Consult   Living arrangements for the past 2 months: Single Family Home                 DME Arranged: N/A         HH Arranged: NA          Prior Living Arrangements/Services Living arrangements for the past 2 months: Single Family Home Lives with:: Spouse Patient language and need for interpreter reviewed:: Yes Do you feel safe going back to the place where you live?: Yes      Need for Family Participation in Patient Care: Yes (Comment) Care giver support system in place?: Yes (comment) Current home services: DME Criminal Activity/Legal Involvement Pertinent to Current Situation/Hospitalization: No - Comment as needed  Activities of Daily Living      Permission Sought/Granted   Permission granted to share information with : No              Emotional Assessment Appearance:: Appears stated age Attitude/Demeanor/Rapport: Engaged Affect (typically observed): Accepting Orientation: : Oriented to Self, Oriented to Place, Oriented to  Time,  Oriented to Situation Alcohol / Substance Use: Not Applicable Psych Involvement: No (comment)  Admission diagnosis:  Leg edema [R60.0] Left leg swelling [M79.89] Patient Active Problem List   Diagnosis Date Noted   Left leg swelling 04/29/2022   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   Acute deep vein thrombosis (DVT) of left lower extremity ([place]) 04/29/2022   Closed intertrochanteric fracture of left hip, initial encounter [facility])    Closed left hip fracture ([place]) secondary to fall at home 02/08/2022   Paroxysmal atrial fibrillation ([place]) 02/08/2022   Debility 01/08/2022   Constipation 01/07/2022   Physical deconditioning 86/57/8469   Acute metabolic encephalopathy 62/95/2841   Hypomagnesemia 01/04/2022   Pancreatitis 12/30/2021   Parkinson's disease ([place]) 12/21/2021   Parkinson disease ([place]) 12/18/2021   Frequent falls 12/18/2021   History of CVA (cerebrovascular accident) 12/18/2021   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   Hallucinations, visual 07/30/2021   Acetabulum fracture, left ([place]) 06/25/2021   Acute hip pain, left 06/24/2021   Fall at home, initial encounter 06/24/2021   Leukocytosis 06/24/2021   Chronic diastolic heart failure (HCC)    Sinusitis, acute maxillary 06/24/2020   AKI (acute kidney injury) ([place])    Labile blood glucose    Cough    Benign essential HTN    Skin lesion of hand    Hypoalbuminemia due to protein-calorie malnutrition ([place])  Controlled type 2 diabetes mellitus with hyperglycemia, without long-term current use of insulin (HCC)    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   Morbid obesity ([place])    Acute blood loss anemia    Acute thalamic infarction ([place]) 04/15/2020   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[place]) 05/25/2019   TIA (transient ischemic attack) 02/17/2018   Type 2 diabetes mellitus with hyperlipidemia ([place]) 02/17/2018   Insomnia 02/01/2018   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    Chronotropic incompetence 07/09/2014   Dyspnea on exertion 03/03/2014   HLD (hyperlipidemia) 12/13/2007   Essential hypertension 12/13/2007   Class 1 obesity 12/05/2007   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   Coronary atherosclerosis 12/05/2007   PCP:  [name], MD Pharmacy:   [id number] [address] [place]Phone: [phone]: [phone] [facility] [place]Phone: [phone]: [phone]    Social Determinants of Health (SDOH) Interventions    Readmission Risk Interventions     No data to display
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
## 2022-04-29 NOTE — Progress Notes (Signed)
ANTICOAGULATION CONSULT NOTE  Pharmacy Consult:  Xarelto >> Lovenox Indication:  rule out DVT  No Known Allergies  Patient Measurements: Height: '5\' 9"'$  (175.3 cm) Weight: 99.8 kg (220 lb) IBW/kg (Calculated) : 70.7  Vital Signs: Temp: 98.9 F (37.2 C) (06/29 0515) Temp Source: Oral (06/29 0515) BP: 114/61 (06/29 0515) Pulse Rate: 78 (06/29 0515)  Labs: Recent Labs    04/28/22 2256 04/29/22 0109  HGB 14.7  --   HCT 46.3  --   PLT 253  --   CREATININE 1.12  --   TROPONINIHS 8 6    Estimated Creatinine Clearance: 59.2 mL/min (by C-G formula based on SCr of 1.12 mg/dL).   Assessment: 53 YOM with recent left hip surgery in April presented with left leg swelling and discoloration.  Patient was started on Xarelto for rule out DVT while awaiting venous duplex.  CTA negative for PE.  Pharmacy consulted to transition patient to Lovenox.  Last Xarelto dose was on 6/29 at 0641.  Renal function and CBC stable; no bleeding reported.  Goal of Therapy:  Anti-Xa level 0.6-1 units/ml 4hrs after LMWH dose given Monitor platelets by anticoagulation protocol: Yes   Plan:  Start Lovenox '100mg'$  SQ Q12H this evening CBC Q72H while on Lovenox F/U Doppler result  Bruce Mayers D. Mina Marble, PharmD, BCPS, [place] 04/29/2022, 8:19 AM

## 2022-04-29 NOTE — Progress Notes (Signed)
   04/29/22 2035  Provider Notification  Provider Name/Title Dr Myna Hidalgo  Date Provider Notified 04/29/22  Time Provider Notified 2035  Method of Notification Call;Page  Notification Reason Other (Comment) (patient noted to have tea colored urine, Bed pad stained with signifacant amout of blood from urine.)  Provider response See new orders  Date of Provider Response 04/29/22  Time of Provider Response 2035

## 2022-04-29 NOTE — Progress Notes (Signed)
Patient admitted thru ED with Left Leg swelling. Slightly red and tender to touch but denies pain. MAE, made him comfortable in bed. Oriented to the unit and call bell placed within reach. Awaiting on Attending's orders.

## 2022-04-30 ENCOUNTER — Other Ambulatory Visit (HOSPITAL_COMMUNITY): Payer: Self-pay

## 2022-04-30 DIAGNOSIS — I824Y2 Acute embolism and thrombosis of unspecified deep veins of left proximal lower extremity: Secondary | ICD-10-CM | POA: Diagnosis not present

## 2022-04-30 LAB — GLUCOSE, CAPILLARY
Glucose-Capillary: 173 mg/dL — ABNORMAL HIGH (ref 70–99)
Glucose-Capillary: 174 mg/dL — ABNORMAL HIGH (ref 70–99)
Glucose-Capillary: 191 mg/dL — ABNORMAL HIGH (ref 70–99)
Glucose-Capillary: 200 mg/dL — ABNORMAL HIGH (ref 70–99)
Glucose-Capillary: 210 mg/dL — ABNORMAL HIGH (ref 70–99)

## 2022-04-30 LAB — BASIC METABOLIC PANEL
Anion gap: 13 (ref 5–15)
BUN: 24 mg/dL — ABNORMAL HIGH (ref 8–23)
CO2: 19 mmol/L — ABNORMAL LOW (ref 22–32)
Calcium: 9.1 mg/dL (ref 8.9–10.3)
Chloride: 105 mmol/L (ref 98–111)
Creatinine, Ser: 0.88 mg/dL (ref 0.61–1.24)
GFR, Estimated: >60 mL/min (ref >60)
Glucose, Bld: 201 mg/dL — ABNORMAL HIGH (ref 70–99)
Potassium: 4.3 mmol/L (ref 3.5–5.1)
Sodium: 137 mmol/L (ref 135–145)

## 2022-04-30 LAB — CBC
HCT: 40.3 % (ref 39.0–52.0)
Hemoglobin: 13.6 g/dL (ref 13.0–17.0)
MCH: 29.6 pg (ref 26.0–34.0)
MCHC: 33.7 g/dL (ref 30.0–36.0)
MCV: 87.6 fL (ref 80.0–100.0)
Platelets: 194 10*3/uL (ref 150–400)
RBC: 4.6 MIL/uL (ref 4.22–5.81)
RDW: 13.9 % (ref 11.5–15.5)
WBC: 9.9 10*3/uL (ref 4.0–10.5)
nRBC: 0 % (ref 0.0–0.2)

## 2022-04-30 LAB — MAGNESIUM: Magnesium: 1.9 mg/dL (ref 1.7–2.4)

## 2022-04-30 MED ORDER — RIVAROXABAN 20 MG PO TABS: 20.0000 mg | ORAL_TABLET | Freq: Every day | ORAL | Status: DC

## 2022-04-30 MED ORDER — RIVAROXABAN 15 MG PO TABS
15.0000 mg | ORAL_TABLET | Freq: Two times a day (BID) | ORAL | Status: DC
  Administered 2022-04-30: 15 mg via ORAL
  Filled 2022-04-30: qty 1

## 2022-04-30 MED ORDER — METHOCARBAMOL 500 MG PO TABS
500.0000 mg | ORAL_TABLET | Freq: Three times a day (TID) | ORAL | 0 refills | Status: DC | PRN
  Filled 2022-04-30: qty 30, 10d supply, fill #0

## 2022-04-30 MED ORDER — RIVAROXABAN (XARELTO) VTE STARTER PACK (15 & 20 MG)
ORAL_TABLET | ORAL | 0 refills | Status: DC
  Filled 2022-04-30: qty 51, 30d supply, fill #0

## 2022-04-30 NOTE — TOC Benefit Eligibility Note (Signed)
Patient Teacher, English as a foreign language completed.    The patient is currently admitted and upon discharge could be taking Eliquis 5 mg.  The current 30 day co-pay is, $47.00.   The patient is currently admitted and upon discharge could be taking Xarelto 20 mg.  The current 30 day co-pay is, $47.00.   The patient is insured through [place], [place] Patient Advocate Specialist [place] Patient Advocate Team Direct Number: [phone]  Fax: [phone]

## 2022-04-30 NOTE — TOC Progression Note (Signed)
Transition of Care [facility]) - Progression Note    Patient Details  Name: Jordan [name] MRN: [id number] Date of Birth: [birth year]  [facility] CM/SW Contact  Jacalyn Lefevre Edson Snowball, RN Phone Number: 04/30/2022, 11:13 AM  Clinical Narrative:     Plan to discharge on Xarelto . Benefit check $47.00/month. Provided 30 day free card and explained to patient.   Expected Discharge Plan: Home/Self Care Barriers to Discharge: Continued Medical Work up  Expected Discharge Plan and Services Expected Discharge Plan: Home/Self Care   Discharge Planning Services: CM Consult   Living arrangements for the past 2 months: Single Family Home                 DME Arranged: N/A         HH Arranged: NA           Social Determinants of Health (SDOH) Interventions    Readmission Risk Interventions     No data to display

## 2022-04-30 NOTE — Plan of Care (Signed)
Patient ID: Jordan [name], male   DOB: December 02, 1940, 81 y.o.   MRN: [id number]  Problem: Education: Goal: Knowledge of General Education information will improve Description: Including pain rating scale, medication(s)/side effects and non-pharmacologic comfort measures Outcome: Adequate for Discharge   Problem: Health Behavior/Discharge Planning: Goal: Ability to manage health-related needs will improve Outcome: Adequate for Discharge   Problem: Clinical Measurements: Goal: Ability to maintain clinical measurements within normal limits will improve Outcome: Adequate for Discharge Goal: Will remain free from infection Outcome: Adequate for Discharge Goal: Diagnostic test results will improve Outcome: Adequate for Discharge Goal: Respiratory complications will improve Outcome: Adequate for Discharge Goal: Cardiovascular complication will be avoided Outcome: Adequate for Discharge   Problem: Activity: Goal: Risk for activity intolerance will decrease Outcome: Adequate for Discharge   Problem: Nutrition: Goal: Adequate nutrition will be maintained Outcome: Adequate for Discharge   Problem: Coping: Goal: Level of anxiety will decrease Outcome: Adequate for Discharge   Problem: Elimination: Goal: Will not experience complications related to bowel motility Outcome: Adequate for Discharge Goal: Will not experience complications related to urinary retention Outcome: Adequate for Discharge   Problem: Pain Managment: Goal: General experience of comfort will improve Outcome: Adequate for Discharge   Problem: Safety: Goal: Ability to remain free from injury will improve Outcome: Adequate for Discharge   Problem: Skin Integrity: Goal: Risk for impaired skin integrity will decrease Outcome: Adequate for Discharge   Problem: Education: Goal: Ability to describe self-care measures that may prevent or decrease complications (Diabetes Survival Skills Education) will improve Outcome:  Adequate for Discharge Goal: Individualized Educational Video(s) Outcome: Adequate for Discharge   Problem: Coping: Goal: Ability to adjust to condition or change in health will improve Outcome: Adequate for Discharge   Problem: Fluid Volume: Goal: Ability to maintain a balanced intake and output will improve Outcome: Adequate for Discharge   Problem: Health Behavior/Discharge Planning: Goal: Ability to identify and utilize available resources and services will improve Outcome: Adequate for Discharge Goal: Ability to manage health-related needs will improve Outcome: Adequate for Discharge   Problem: Metabolic: Goal: Ability to maintain appropriate glucose levels will improve Outcome: Adequate for Discharge   Problem: Nutritional: Goal: Maintenance of adequate nutrition will improve Outcome: Adequate for Discharge Goal: Progress toward achieving an optimal weight will improve Outcome: Adequate for Discharge   Problem: Skin Integrity: Goal: Risk for impaired skin integrity will decrease Outcome: Adequate for Discharge   Problem: Tissue Perfusion: Goal: Adequacy of tissue perfusion will improve Outcome: Adequate for Discharge    Haydee Salter, RN

## 2022-04-30 NOTE — Discharge Instructions (Addendum)
Information on my medicine - XARELTO (rivaroxaban)  This medication education was reviewed with me or my healthcare representative as part of my discharge preparation.   WHY WAS XARELTO PRESCRIBED FOR YOU? Xarelto was prescribed to treat blood clots that may have been found in the veins of your legs (deep vein thrombosis) or in your lungs (pulmonary embolism) and to reduce the risk of them occurring again.  What do you need to know about Xarelto? The starting dose is one 15 mg tablet taken TWICE daily with food for the FIRST 21 DAYS then on 05/21/22 the dose is changed to one 20 mg tablet taken ONCE A DAY with your evening meal.  DO NOT stop taking Xarelto without talking to the health care provider who prescribed the medication.  Refill your prescription for 20 mg tablets before you run out.  After discharge, you should have regular check-up appointments with your healthcare provider that is prescribing your Xarelto.  In the future your dose may need to be changed if your kidney function changes by a significant amount.  What do you do if you miss a dose? If you are taking Xarelto TWICE DAILY and you miss a dose, take it as soon as you remember. You may take two 15 mg tablets (total 30 mg) at the same time then resume your regularly scheduled 15 mg twice daily the next day.  If you are taking Xarelto ONCE DAILY and you miss a dose, take it as soon as you remember on the same day then continue your regularly scheduled once daily regimen the next day. Do not take two doses of Xarelto at the same time.   Important Safety Information Xarelto is a blood thinner medicine that can cause bleeding. You should call your healthcare provider right away if you experience any of the following: Bleeding from an injury or your nose that does not stop. Unusual colored urine (red or dark brown) or unusual colored stools (red or black). Unusual bruising for unknown reasons. A serious fall or if you  hit your head (even if there is no bleeding).  Some medicines may interact with Xarelto and might increase your risk of bleeding while on Xarelto. To help avoid this, consult your healthcare provider or pharmacist prior to using any new prescription or non-prescription medications, including herbals, vitamins, non-steroidal anti-inflammatory drugs (NSAIDs) and supplements.  This website has more information on Xarelto: [email or web address].[email or web address]

## 2022-04-30 NOTE — Progress Notes (Signed)
Mobility Specialist Progress Note:   04/30/22 1100  Mobility  Activity Transferred to/from [facility]  Level of Assistance Minimal assist, patient does 75% or more  Assistive Device Front wheel walker  Distance Ambulated (ft) 4 ft  Activity Response Tolerated well  $Mobility charge 1 Mobility   Pt received on BSC, BM unsuccessful. Pt stood from [facility] with min guard assist, took pivotal steps then up toward [facility] with minA. Pt back in bed with all needs met, bed alarm on.   Nelta Numbers Acute Rehab Secure Chat or Office Phone: [phone]

## 2022-04-30 NOTE — Progress Notes (Signed)
Mobility Specialist Progress Note:   04/30/22 1040  Mobility  Activity Transferred to/from [facility]  Level of Assistance Minimal assist, patient does 75% or more (+2)  Assistive Device Front wheel walker  Distance Ambulated (ft) 2 ft  Activity Response Tolerated well  $Mobility charge 1 Mobility   Pt requesting to go to BR. Required MinA+2 to stand and pivot to [facility] with RW. Pt left on BSC, with call bell in reach. Will f/u to get back.   Nelta Numbers Acute Rehab Secure Chat or Office Phone: [phone]

## 2022-04-30 NOTE — Progress Notes (Signed)
Mobility Specialist Progress Note:   04/30/22 1620  Mobility  Activity Transferred from bed to chair  Level of Assistance Minimal assist, patient does 75% or more  Assistive Device Front wheel walker  Distance Ambulated (ft) 2 ft  Activity Response Tolerated well  $Mobility charge 1 Mobility   Pt transferred to chair. Required minA to stand from EOB and to take pivot steps to chair. Pt left with all needs met, NT in room.  Nelta Numbers Acute Rehab Secure Chat or Office Phone: [phone]

## 2022-04-30 NOTE — Progress Notes (Addendum)
ANTICOAGULATION CONSULT NOTE  Pharmacy Consult:  Lovenox >> Xarelto Indication:  Acute DVT  No Known Allergies  Patient Measurements: Height: '5\' 9"'$  (175.3 cm) Weight: 99.8 kg (220 lb) IBW/kg (Calculated) : 70.7  Vital Signs: Temp: 98 F (36.7 C) (06/30 0730) Temp Source: Oral (06/30 0730) BP: 132/78 (06/30 0730) Pulse Rate: 59 (06/30 0730)  Labs: Recent Labs    04/28/22 2256 04/29/22 0109 04/29/22 1036 04/29/22 1310 04/30/22 0104  HGB 14.7  --   --  14.8 13.6  HCT 46.3  --   --  43.2 40.3  PLT 253  --   --  235 194  APTT  --   --  39*  --   --   LABPROT  --   --  23.9*  --   --   INR  --   --  2.2*  --   --   HEPARINUNFRC  --   --  >1.10*  --   --   CREATININE 1.12  --  1.10  --  0.88  TROPONINIHS 8 6  --   --   --      Estimated Creatinine Clearance: 75.3 mL/min (by C-G formula based on SCr of 0.88 mg/dL).   Assessment: Jordan [name] with recent left hip surgery in April presented with left leg swelling and discoloration.  Currently on full-dose Lovenox for acute L DVT.  Pharmacy consulted to transition patient back to Xarelto since patient is not a candidate for mechanical thrombectomy.  Last Lovenox dose was on 6/30 ~0700.  Renal function and CBC stable; no bleeding reported.  Goal of Therapy:  Appropriate anticoagulation Monitor platelets by anticoagulation protocol: Yes   Plan:  This evening, start Xarelto '15mg'$  PO BIDwm x 41 doses, then on 7/21 start '20mg'$  PO daily with supper Pharmacy will sign off and monitor peripherally.  Thank you for the consult!  Alondra Sahni D. Mina Marble, PharmD, BCPS, [place] 04/30/2022, 11:09 AM

## 2022-04-30 NOTE — Progress Notes (Addendum)
  Progress Note    04/30/2022 8:47 AM Hospital Day 2  Subjective:  says his leg feels better today since being wrapped and elevating  afebrile  Vitals:   04/30/22 0411 04/30/22 0730  BP: 132/88 132/78  Pulse: 72 (!) 59  Resp: 20 18  Temp: 98.5 F (36.9 C) 98 F (36.7 C)  SpO2: 94% 94%    Physical Exam: General:  no distress Lungs:  on CPAP Extremities:  LLE wrapped with ace wrap  CBC    Component Value Date/Time   WBC 9.9 04/30/2022 0104   RBC 4.60 04/30/2022 0104   HGB 13.6 04/30/2022 0104   HCT 40.3 04/30/2022 0104   PLT 194 04/30/2022 0104   MCV 87.6 04/30/2022 0104   MCH 29.6 04/30/2022 0104   MCHC 33.7 04/30/2022 0104   RDW 13.9 04/30/2022 0104   LYMPHSABS 2.2 04/29/2022 1310   MONOABS 1.0 04/29/2022 1310   EOSABS 0.4 04/29/2022 1310   BASOSABS 0.1 04/29/2022 1310    BMET    Component Value Date/Time   NA 137 04/30/2022 0104   NA 145 07/23/2021 0000   K 4.3 04/30/2022 0104   CL 105 04/30/2022 0104   CO2 19 (L) 04/30/2022 0104   GLUCOSE 201 (H) 04/30/2022 0104   BUN 24 (H) 04/30/2022 0104   BUN 14 07/23/2021 0000   CREATININE 0.88 04/30/2022 0104   CREATININE 1.16 03/18/2016 1011   CALCIUM 9.1 04/30/2022 0104   GFRNONAA >60 04/30/2022 0104   GFRAA >90 07/23/2021 0000    INR    Component Value Date/Time   INR 2.2 (H) 04/29/2022 1036     Intake/Output Summary (Last 24 hours) at 04/30/2022 0847 Last data filed at 04/30/2022 0600 Gross per 24 hour  Intake 720 ml  Output 860 ml  Net -140 ml     Assessment/Plan:  81 y.o. male with LLE DVT Hospital Day 2  -pt subjectively better with compression and elevation.  Continue anticoagulation, compression and elevation.  Dr. Carlis Abbott did not feel there was a clear indication for percutaneous mechanical thrombectomy.    Leontine Locket, PA-C [facility] [phone] 04/30/2022 8:47 AM   I have seen and evaluated the patient. I agree with the PA note as documented above.   Again discussed with patient I do not think he meets criteria for percutaneous mechanical thrombectomy for extensive left leg DVT given very limited functional status at baseline following hip fracture several months ago.  Would recommend 6 months of anticoagulation for first-time provoked DVT.  States his leg feels better after Ace wrap yesterday.  We will order compression stocking.  Call vascular with any questions or concerns.  Marty Heck, MD [facility] [place] Office: [phone]

## 2022-05-01 DIAGNOSIS — I82409 Acute embolism and thrombosis of unspecified deep veins of unspecified lower extremity: Secondary | ICD-10-CM

## 2022-05-01 HISTORY — DX: Acute embolism and thrombosis of unspecified deep veins of unspecified lower extremity: I82.409

## 2022-05-01 LAB — HEMOGLOBIN A1C
Hgb A1c MFr Bld: 8.4 % — ABNORMAL HIGH (ref 4.8–5.6)
Mean Plasma Glucose: 194 mg/dL

## 2022-05-05 ENCOUNTER — Other Ambulatory Visit: Payer: Self-pay | Admitting: Physical Medicine and Rehabilitation

## 2022-05-07 DIAGNOSIS — I82492 Acute embolism and thrombosis of other specified deep vein of left lower extremity: Secondary | ICD-10-CM | POA: Diagnosis not present

## 2022-05-07 DIAGNOSIS — E1169 Type 2 diabetes mellitus with other specified complication: Secondary | ICD-10-CM | POA: Diagnosis not present

## 2022-05-07 DIAGNOSIS — E559 Vitamin D deficiency, unspecified: Secondary | ICD-10-CM | POA: Diagnosis not present

## 2022-05-07 DIAGNOSIS — R310 Gross hematuria: Secondary | ICD-10-CM | POA: Diagnosis not present

## 2022-05-07 NOTE — Discharge Summary (Signed)
Physician Discharge Summary   Patient: Jordan [name] MRN: [id number] DOB: April 24, 1941  Admit date:     04/28/2022  Discharge date: 04/30/2022  Discharge Physician: Berle Mull  PCP: [name], MD  Recommendations at discharge: Follow-up with PCP in 1 week. Follow-up with vascular surgery as needed. Recommend anticoagulation for 6 months.  May require reevaluation to consider whether recommend prolonged anticoagulation given his mobility or not. Once DVT is resolved. Check ABI.  Discharge Diagnoses: Principal Problem:   Acute deep vein thrombosis (DVT) of left lower extremity (HCC) Active Problems:   Closed left hip fracture (HCC) secondary to fall at home   Chronic diastolic heart failure (HCC)   Essential hypertension   History of CVA (cerebrovascular accident)   Benign essential HTN   HLD (hyperlipidemia)   Type 2 diabetes mellitus with hyperlipidemia (HCC)   Parkinson's disease ([place])   Presence of permanent cardiac pacemaker   Class 1 obesity  Assessment and Plan:  Acute deep vein thrombosis (DVT) of left lower extremity (HCC) Lower extremity Doppler shows DVT in left common femoral vein, SF junction, left femoral vein, left proximal  profunda vein, left popliteal vein, left posterior tibial veins, and EIV  dist.  appreciate vascular surgery follow-up. Patient is currently on Lovenox. Patient per vascular surgery is not a good candidate for any interventional procedure as of right now. Transition to oral Xarelto for patient with vascular surgery. Outpatient follow-up with vascular surgery as needed. Follow-up with PCP. Patient will require 6 months of anticoagulation. This may be a patient with before discontinuing anticoagulation we may want repeat lower extremity Doppler.  Closed left hip fracture ([place]) secondary to fall at home Continue PT OT.     Chronic diastolic heart failure (HCC) Volume status adequate. Actually treated with IV fluids in the  hospital.    Essential hypertension Blood pressure stable. Continue home regimen.     History of CVA (cerebrovascular accident) During the H&P the patient reported that he is on aspirin and Plavix. At time of my evaluation patient denies taking any aspirin or Plavix. Given increased risk of bleeding currently aspirin and Plavix on hold. Outpatient follow-up with PCP requested resumption of this medication.     HLD (hyperlipidemia) Continue statin.   Type 2 diabetes mellitus with hyperlipidemia (HCC) Uncontrolled with hyperglycemia.  Hemoglobin A1c 9 1. Without long-term insulin use. Continue home Regimen.   Parkinson's disease ([place]) Continue Sinemet.   Presence of permanent cardiac pacemaker Monitor for now.   Class 1 obesity Body mass index is 32.49 kg/m.  Placing the patient at high risk for poor outcome.  Consultants: Vascular surgery Procedures performed:  Lower extremity Doppler DISCHARGE MEDICATION: Allergies as of 04/30/2022   No Known Allergies      Medication List     STOP taking these medications    acetaminophen 325 MG tablet Commonly known as: TYLENOL   aspirin 81 MG chewable tablet   clopidogrel 75 MG tablet Commonly known as: Plavix   oxyCODONE 5 MG immediate release tablet Commonly known as: Oxy IR/ROXICODONE   senna-docusate 8.6-50 MG tablet Commonly known as: Senokot-S       TAKE these medications    amantadine 100 MG capsule Commonly known as: SYMMETREL Take 1 capsule (100 mg total) by mouth daily. What changed: how much to take   atorvastatin 40 MG tablet Commonly known as: LIPITOR Take 0.5 tablets (20 mg total) by mouth daily.   BENEFIBER PO Take 10 mLs by mouth daily.   bifidobacterium  infantis capsule Take 1 capsule by mouth daily.   Carbidopa-Levodopa ER 25-100 MG tablet controlled release Commonly known as: SINEMET CR TAKE 1 TABLET BY MOUTH EVERYDAY AT BEDTIME What changed: See the new instructions.    cholecalciferol 25 MCG (1000 UNIT) tablet Commonly known as: VITAMIN D3 Take 1,000 Units by mouth daily.   docusate sodium 100 MG capsule Commonly known as: COLACE Take 1 capsule (100 mg total) by mouth 2 (two) times daily.   Ensure Plus Liqd Take 237 mLs by mouth every other day.   feeding supplement (GLUCERNA SHAKE) Liqd Take 237 mLs by mouth 3 (three) times daily between meals.   furosemide 20 MG tablet Commonly known as: LASIX USE AS NEEDED FOR UP TO 5 DAYS IF SWELLING IN LEGS RECURS. CONTACT CARDIOLOGY IS SWELLING DOES NOT IMPROVE OR GETS WORSE.   HM SALONPAS PAIN RELIEF EX Apply 1 patch topically daily as needed (pain).   lidocaine 5 % Commonly known as: LIDODERM Place 1 patch onto the skin daily as needed. Purchase over the counter. On for 12 hours and off for 12 hours   magnesium gluconate 500 MG tablet Commonly known as: MAGONATE Take 0.5 tablets (250 mg total) by mouth at bedtime.   Melatonin 10 MG Tabs Take 10 mg by mouth at bedtime.   metFORMIN 1000 MG tablet Commonly known as: GLUCOPHAGE Take 1 tablet (1,000 mg total) by mouth daily with breakfast. What changed: how much to take   methocarbamol 500 MG tablet Commonly known as: ROBAXIN Take 1 tablet (500 mg total) by mouth every 8 (eight) hours as needed for muscle spasms.   metoprolol tartrate 25 MG tablet Commonly known as: LOPRESSOR Take 1 tablet (25 mg total) by mouth 2 (two) times daily.   multivitamin with minerals Tabs tablet Take 1 tablet by mouth daily.   Nitrostat 0.4 MG SL tablet Generic drug: nitroGLYCERIN PLACE 1 TABLET (0.4 MG TOTAL) UNDER THE TONGUE EVERY 5 (FIVE) MINUTES AS NEEDED FOR CHEST PAIN. What changed: how much to take   Omega 3 1200 MG Caps Take 1,200 mg by mouth daily.   polyethylene glycol 17 g packet Commonly known as: MIRALAX / GLYCOLAX Take 17 g by mouth daily.   polyvinyl alcohol 1.4 % ophthalmic solution Commonly known as: LIQUIFILM TEARS Place 1 drop into  both eyes as needed for dry eyes.   QUEtiapine 25 MG tablet Commonly known as: SEROQUEL TAKE 1/2 TABLETS (12.5 MG TOTAL) BY MOUTH AT BEDTIME.   Xarelto Starter Pack Generic drug: Rivaroxaban Stater Pack (15 mg and 20 mg) Follow package directions: Take one '15mg'$  tablet by mouth twice a day. On day 22, switch to one '20mg'$  tablet once a day. Take with food.        Follow-up Information     [name], MD. Schedule an appointment as soon as possible for a visit in 1 week(s).   Specialty: Family Medicine Contact information: [place] [address] [phone]                Disposition: Home Diet recommendation: Cardiac diet  Discharge Exam: Vitals:   04/29/22 1949 04/30/22 0411 04/30/22 0730 04/30/22 1629  BP: 123/82 132/88 132/78 122/75  Pulse: 73 72 (!) 59 70  Resp: (!) '24 20 18 17  '$ Temp: 98 F (36.7 C) 98.5 F (36.9 C) 98 F (36.7 C) 98.6 F (37 C)  TempSrc: Oral Oral Oral Oral  SpO2: 93% 94% 94% 98%  Weight:  Height:       General: Appear in mild distress; no visible Abnormal Neck Mass Or lumps, Conjunctiva normal Cardiovascular: S1 and S2 Present, no Murmur, Respiratory: good respiratory effort, Bilateral Air entry present and CTA, no Crackles, no wheezes Abdomen: Bowel Sound present, Non tender Extremities: bilateral Pedal edema, left more than right Neurology: alert and oriented to time, place, and person  Gait not checked due to patient safety concerns Filed Weights   04/28/22 2223  Weight: 99.8 kg   Condition at discharge: stable  The results of significant diagnostics from this hospitalization (including imaging, microbiology, ancillary and laboratory) are listed below for reference.   Imaging Studies: VAS [place] LOWER EXTREMITY VENOUS (DVT)  Result Date: 04/29/2022  Lower Venous DVT Study Patient Name:  Jordan [name]  Date of Exam:   04/29/2022 Medical Rec #: [id number]        Accession #:    [id number] Date  of Birth: 03/10/1941        Patient Gender: M Patient Age:   7 years Exam Location:  [facility] Procedure:      VAS [place] LOWER EXTREMITY VENOUS (DVT) Referring Phys: TIMOTHY OPYD --------------------------------------------------------------------------------  Indications: Swelling.  Comparison Study: No previous exam noted. Performing Technologist: Bobetta Lime BS, RVT  Examination Guidelines: A complete evaluation includes B-mode imaging, spectral Doppler, color Doppler, and power Doppler as needed of all accessible portions of each vessel. Bilateral testing is considered an integral part of a complete examination. Limited examinations for reoccurring indications may be performed as noted. The reflux portion of the exam is performed with the patient in reverse Trendelenburg.  +-----+---------------+---------+-----------+----------+--------------+ RIGHTCompressibilityPhasicitySpontaneityPropertiesThrombus Aging +-----+---------------+---------+-----------+----------+--------------+ CFV  Full           Yes      Yes                                 +-----+---------------+---------+-----------+----------+--------------+   +---------+---------------+---------+-----------+----------+-------------------+ LEFT     CompressibilityPhasicitySpontaneityPropertiesThrombus Aging      +---------+---------------+---------+-----------+----------+-------------------+ CFV      None           No       No                   Acute               +---------+---------------+---------+-----------+----------+-------------------+ SFJ      None                                         Acute               +---------+---------------+---------+-----------+----------+-------------------+ FV Prox  None           No       No                   Acute               +---------+---------------+---------+-----------+----------+-------------------+ FV Mid   None           No       No                    Acute               +---------+---------------+---------+-----------+----------+-------------------+ FV DistalNone  Yes      No                   Acute               +---------+---------------+---------+-----------+----------+-------------------+ PFV      None                                         Acute               +---------+---------------+---------+-----------+----------+-------------------+ POP      Partial        Yes      Yes                  Acute               +---------+---------------+---------+-----------+----------+-------------------+ PTV      Partial                                      Acute               +---------+---------------+---------+-----------+----------+-------------------+ PERO                                                  Not well visualized +---------+---------------+---------+-----------+----------+-------------------+ EIV Dist None           Yes      Yes                  Acute               +---------+---------------+---------+-----------+----------+-------------------+   Left Technical Findings: The left external iliac vein showed some flow in the distal segment however the mid segment and the IVC were not able to be visualized due to bowel gas. The left posterial tibial artery and dorsalis pedis artery are triphasic. Unable to attempt ordered ABI due to acute DVT.   Summary: RIGHT: - No evidence of common femoral vein obstruction.  LEFT: - Findings consistent with acute deep vein thrombosis involving the left common femoral vein, SF junction, left femoral vein, left proximal profunda vein, left popliteal vein, left posterior tibial veins, and EIV dist.  *See table(s) above for measurements and observations. Electronically signed by Monica Martinez MD on 04/29/2022 at 4:45:32 PM.    Final    CUP PACEART REMOTE DEVICE CHECK  Result Date: 04/29/2022 Scheduled remote reviewed. Normal device function.  Next remote 91 days.  Kathy Breach, RN, CCDS, [employer]  CT Angio Chest PE W and/or Wo Contrast  Result Date: 04/29/2022 CLINICAL DATA:  Pulmonary embolus suspected with high probability. Swelling and discoloration to the left leg today. EXAM: CT ANGIOGRAPHY CHEST WITH CONTRAST TECHNIQUE: Multidetector CT imaging of the chest was performed using the standard protocol during bolus administration of intravenous contrast. Multiplanar CT image reconstructions and MIPs were obtained to evaluate the vascular anatomy. RADIATION DOSE REDUCTION: This exam was performed according to the departmental dose-optimization program which includes automated exposure control, adjustment of the mA and/or kV according to patient size and/or use of iterative reconstruction technique. CONTRAST:  68m OMNIPAQUE IOHEXOL 350 MG/ML SOLN COMPARISON:  None Available. FINDINGS: Cardiovascular: Good opacification of  the central and segmental pulmonary arteries. No focal filling defects. No evidence of significant pulmonary embolus. Normal heart size. No pericardial effusions. Calcification in the aortic and mitral valves as well as the aorta and coronary arteries. No aortic aneurysm. Cardiac pacemaker. Mediastinum/Nodes: Esophagus is decompressed. No significant lymphadenopathy. Thyroid gland is unremarkable. Lungs/Pleura: Mild atelectasis in the lung bases. No airspace disease or consolidation. No pleural effusions. No pneumothorax. Upper Abdomen: Vascular calcifications. Nonobstructing stones in both kidneys. No acute abnormalities. Musculoskeletal: No chest wall abnormality. No acute or significant osseous findings. Review of the MIP images confirms the above findings. IMPRESSION: 1. No evidence of significant pulmonary embolus. 2. Mild atelectasis in the lung bases. 3. Bilateral nonobstructing intrarenal stones. 4. Aortic atherosclerosis. Electronically Signed   By: Lucienne Capers M.D.   On: 04/29/2022 01:06    Microbiology: Results for orders  placed or performed during the hospital encounter of 02/08/22  Surgical PCR Screen     Status: None   Collection Time: 02/08/22  2:05 PM   Specimen: Nasal Mucosa; Nasal Swab  Result Value Ref Range Status   MRSA, PCR NEGATIVE NEGATIVE Final   Staphylococcus aureus NEGATIVE NEGATIVE Final    Comment: (NOTE) The Xpert SA Assay (FDA approved for NASAL specimens in patients 22 years of age and older), is one component of a comprehensive surveillance program. It is not intended to diagnose infection nor to guide or monitor treatment. Performed at [facility], [place] [address], [place], [place] [address]     Labs: CBC: No results for input(s): "WBC", "NEUTROABS", "HGB", "HCT", "MCV", "PLT" in the last 168 hours. Basic Metabolic Panel: No results for input(s): "NA", "K", "CL", "CO2", "GLUCOSE", "BUN", "CREATININE", "CALCIUM", "MG", "PHOS" in the last 168 hours. Liver Function Tests: No results for input(s): "AST", "ALT", "ALKPHOS", "BILITOT", "PROT", "ALBUMIN" in the last 168 hours. CBG: Recent Labs  Lab 04/30/22 1631  GLUCAP 210*    Discharge time spent: greater than 30 minutes.  Signed: Berle Mull, MD [facility] 04/30/2022

## 2022-05-09 NOTE — Therapy (Signed)
OUTPATIENT OCCUPATIONAL THERAPY [facility]  Patient Name: Jordan [name] MRN: [id number] DOB:[birth year], 81 y.o., male Today's Date: 05/09/2022  PCP: Dr. Kenton [name] REFERRING PROVIDER: Dr. Ranell [name]     Past Medical History:  Diagnosis Date   Arthritis    R shoulder, bone spur   Arthritis    "hands" (03/25/2016)   Chronic diastolic heart failure (HCC)    Chronic lower back pain    Coronary heart disease    Dr Jordan [name]   H/O cardiovascular stress test    perhaps last one was 2009   Heart murmur    12/29/11 echo: mild MR, no AS, trivial TR   History of gout    Hyperlipidemia    Hypertension    saw Dr. Linard [name] last earl;y- 2014, cardiac cath. last done ?2009, blocks seen didn't require any intervention at that point.   Narcolepsy    [name] 04-11-97; Mean Latency 1.53mn, SOREM 2   OSA on CPAP    NPSG 12-13-98 AHI 22.7   PONV (postoperative nausea and vomiting)    Presence of permanent cardiac pacemaker    Shortness of breath    with exertion    Stroke ([place]    Type II diabetes mellitus ([facility]    Past Surgical History:  Procedure Laterality Date   CARDIAC CATHETERIZATION  2009?   EP IMPLANTABLE DEVICE N/A 03/25/2016   Procedure: Pacemaker Implant - Dual Chamber;  Surgeon: GEvans Lance MD;  [facility];  Service: Cardiovascular;  Laterality: N/A;   FRACTURE SURGERY     INSERT / REPLACE / REMOVE PACEMAKER  03/24/2016   INTRAMEDULLARY (IM) NAIL INTERTROCHANTERIC Left 02/08/2022   Procedure: INTRAMEDULLARY (IM) NAIL INTERTROCHANTRIC;  Surgeon: Jordan Pel MD;  [place] [facility]  Service: Orthopedics;  Laterality: Left;   IR KYPHO LUMBAR INC FX REDUCE BONE BX UNI/BIL CANNULATION INC/IMAGING  12/29/2021   IR RADIOLOGIST EVAL & MGMT  12/15/2021   LEFT AND RIGHT HEART CATHETERIZATION WITH CORONARY ANGIOGRAM N/A 06/05/2014   Procedure: LEFT AND RIGHT HEART CATHETERIZATION WITH CORONARY ANGIOGRAM;  Surgeon: Jordan Grooms MD;  [facility];   Service: Cardiovascular;  Laterality: N/A;   NASAL FRACTURE SURGERY     SHOULDER ARTHROSCOPY WITH ROTATOR CUFF REPAIR AND SUBACROMIAL DECOMPRESSION Right 08/16/2013   Procedure: SHOULDER ARTHROSCOPY WITH ROTATOR CUFF REPAIR AND SUBACROMIAL DECOMPRESSION;  Surgeon: JNita Sells MD;  [facility]  Service: Orthopedics;  Laterality: Right;  Right shoulder arthroscopy rotator cuff repair, subacromial decompression.   TONSILLECTOMY     Patient Active Problem List   Diagnosis Date Noted   Acute deep vein thrombosis (DVT) of left lower extremity ([facility] 04/29/2022   Closed intertrochanteric fracture of left hip, initial encounter ([facility]    Closed left hip fracture ([facility] secondary to fall at home 02/08/2022   Paroxysmal atrial fibrillation ([place] 02/08/2022   Debility 01/08/2022   Constipation 01/07/2022   Physical deconditioning 047/82/9562  Acute metabolic encephalopathy 013/06/6577  Hypomagnesemia 01/04/2022   Pancreatitis 12/30/2021   Parkinson's disease ([facility] 12/21/2021   Parkinson disease ([place] 12/18/2021   Frequent falls 12/18/2021   History of CVA (cerebrovascular accident) 12/18/2021   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   Hallucinations, visual 07/30/2021   Acetabulum fracture, left ([facility] 06/25/2021   Acute hip pain, left 06/24/2021   Fall at home, initial encounter 06/24/2021   Leukocytosis 06/24/2021   Chronic diastolic heart failure (HCC)    Sinusitis, acute maxillary 06/24/2020   AKI (acute kidney  injury) ([place])    Labile blood glucose    Cough    Benign essential HTN    Skin lesion of hand    Hypoalbuminemia due to protein-calorie malnutrition ([place])    Controlled type 2 diabetes mellitus with hyperglycemia, without long-term current use of insulin ([place])    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)    Acute blood loss anemia    Acute thalamic infarction ([place]) 04/15/2020   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[place]) 05/25/2019    TIA (transient ischemic attack) 02/17/2018   Type 2 diabetes mellitus with hyperlipidemia ([place]) 02/17/2018   Insomnia 02/01/2018   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   Chronotropic incompetence 07/09/2014   Dyspnea on exertion 03/03/2014   HLD (hyperlipidemia) 12/13/2007   Essential hypertension 12/13/2007   Class 1 obesity 12/05/2007   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   Coronary atherosclerosis 12/05/2007    ONSET DATE: 04/19/22  REFERRING DIAG: thalamic CVA I63.81  THERAPY DIAG:  No diagnosis found.  Rationale for Evaluation and Treatment Rehabilitation  SUBJECTIVE:   SUBJECTIVE STATEMENT: *** Denies pain   PERTINENT HISTORY: Jordan [name] is an 81 year old male with history of CAD/chronic diastolic CHF, OSA, E7OJ, gout, Parkinson's diease with recent hospital admission 02/17-02-22-23 worsening of gait disorder with falls with hallucinations which was treated with medication adjustment and he was discharged to home. He was readmitted on 12/30/21 with abdominal pain with low grade fever, leucocytosis, lactic acidosis, mild confusion and lethargy secondary to sepsis. He was found to have elevated lipase-165  with inflammatory changes suggestive of primary pancreatitis with secondary inflammation of hepatic flexure of colon or primary sigmoid diverticulitis with secondary extension to pancreatic tail. He was treated with IVF and broad spectrum antibiotics.   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 Pt fell and sustained hip fx on 02/08/22 and pt underwent left  IM nailing.   Pt accompanied by: self and significant other, spouse, Jordan [name]    PRECAUTIONS: Fall and Other: , hx of back kyphoplasty  , pacemaker, hx of L hip fx, s/p IM nailing  WEIGHT BEARING RESTRICTIONS No  PAIN:  Are you having pain? Yes: NPRS scale: 3-4/10 Pain location: back Pain description: aching Aggravating  factors: standing Relieving factors: repositioning  FALLS: Has patient fallen in last 6 months? Yes. Number of falls 2-3   LIVING ENVIRONMENT: Lives with: lives with their spouse Lives in: House/apartment, condo Stairs: No Has following equipment at home: Gilford Rile - 2 wheeled  PLOF: Independent prior to CVA in 2021  PATIENT GOALS to be more independent, pt's wife states he would like to be able to drive  OBJECTIVE:  Grip strength : RUE 73.6, LUE 63.9 Shoulder ROM sh. Flexion: RUE 95, LUE 110, elbow extension R -20, L -15 MMT, RUE prox 3+/5, distal 4/5, LUE 4/5  TODAY'S TREATMENT:   ***  Therapist completed UE assessment and added goals. Pt was educated in coordination for bilateral UE's, he returned demonstration with min/ mod  and demonstration v.c   PATIENT EDUCATION: Education details: coordination HEP Person educated: Patient Education method: Explanation, demonstration, verbal cues, handout Education comprehension: verbalized understanding, returned demonstration   HOME EXERCISE PROGRAM: coordination      GOALS: Goals reviewed with patient? No  SHORT TERM GOALS: Target date: 05/14/22  I with initial HEP Baseline: Goal status: INITIAL  2.  Pt will verbalize  understanding of adapted strategies to maximize safety and I with ADLs/ IADLs (NW:GNFAOZH food) Baseline:  Goal status: INITIAL  3.  Pt will demonstrate improved UE functional use as evidenced by increasing LUE box/ blocks score by 3 blocks. Baseline: RUE 45, LUE 37 Goal status: inital  4.  Pt will perform LB dressing with min A demonstrating good safety awareness. Baseline: mod-max A Goal status: INITIAL  5.  Pt will consistently donn pullover shirt mod I in a reasonable amount of time Baseline: increased time required and prn min assist Goal status: INITIAL  6.   Pt will verbalize understanding of ways to prevent future PD related complications and PD community resources. Goal status:  INITIAL  7.. Pt will retrieve items from overhead shelf at 95* with right UE with -15 elbow extension Baseline: 95, -20 Goal status: INITIAL  8. Pt will retrieve items from overhead shelf at 110 with right UE with -10 elbow extension Baseline: 110, -15 Goal status: INITIAL  LONG TERM GOALS: Target date: 07/15/2022    Pt will demonstrate understanding of memory compensations and ways to keep thinking skills sharp  Goal status: INITIAL  2.  Pt will demonstrate improved fine motor coordination for ADLs as evidenced by decreasing 9 hole peg test score for bilateral UE's by 3 secs Baseline: RUE 42.47, LUE 42.25 Goal status: INITIAL  3.  Pt will demonstrate improved ease with feeding as evidenced by decreasing PPT#2 (self feeding) by 3 secs Baseline: 16.66 secs Goal status: INITIAL  4.  Pt will demonstrate improved ease with fastening buttons as evidenced by decreasing 3 button/ unbutton time to : 95 secs Baseline: 1 min 45 secs Goal status: INITIAL  5.  Pt will write a short paragraph with 100% legibility and no significant decrease in letter size Baseline: mild micrographia Goal status: INITIAL  6.  Assess test PPT#4 and set goal prn Goal status: INITIAL   ASSESSMENT:  CLINICAL IMPRESSION: *** Pt is progressing towards goals. He demonstrates understanding of initial HEP for coordination   PERFORMANCE DEFICITS in functional skills including ADLs, IADLs, coordination, dexterity, sensation, ROM, strength, pain, flexibility, FMC, GMC, mobility, balance, endurance, decreased knowledge of precautions, decreased knowledge of use of DME, UE functional use, and bradykinesia, cognitive skills including attention, learn, memory, problem solving, safety awareness, and thought, and psychosocial skills including coping strategies, environmental adaptation, habits, interpersonal interactions, and routines and behaviors.   IMPAIRMENTS are limiting patient from ADLs, IADLs, rest and sleep, play,  leisure, and social participation.   COMORBIDITIES may have co-morbidities  that affects occupational performance. Patient will benefit from skilled OT to address above impairments and improve overall function.  MODIFICATION OR ASSISTANCE TO COMPLETE EVALUATION: Min-Moderate modification of tasks or assist with assess necessary to complete an evaluation.  OT OCCUPATIONAL PROFILE AND HISTORY: Detailed assessment: Review of records and additional review of physical, cognitive, psychosocial history related to current functional performance.  CLINICAL DECISION MAKING: Moderate - several treatment options, min-mod task modification necessary  REHAB POTENTIAL: Good  EVALUATION COMPLEXITY: Moderate       PLAN: OT FREQUENCY: 2x/week  OT DURATION: 12 weeks plus eval  PLANNED INTERVENTIONS: self care/ADL training, therapeutic exercise, therapeutic activity, neuromuscular re-education, manual therapy, passive range of motion, balance training, functional mobility training, aquatic therapy, ultrasound, paraffin, fluidotherapy, moist heat, cryotherapy, contrast bath, patient/family education, cognitive remediation/compensation, visual/perceptual remediation/compensation, energy conservation, coping strategies training, and DME and/or AE instructions  RECOMMENDED OTHER SERVICES: n/a  CONSULTED AND AGREED WITH PLAN OF CARE: Patient and  family member/caregiver  PLAN FOR NEXT SESSION:  *** consider cane exercises, limited shoulder ROM with hx of dislocations. ADL/ big movement strategies   Khayri Kargbo, OTR/L 05/09/2022, 10:21 AM  [phone] Phone: [phone] 10:21 AM 05/09/22

## 2022-05-10 ENCOUNTER — Ambulatory Visit: Payer: Medicare (Managed Care) | Admitting: Occupational Therapy

## 2022-05-10 ENCOUNTER — Encounter: Payer: Self-pay | Admitting: Occupational Therapy

## 2022-05-10 ENCOUNTER — Ambulatory Visit: Payer: Medicare (Managed Care) | Attending: Family Medicine

## 2022-05-10 ENCOUNTER — Ambulatory Visit: Payer: Medicare (Managed Care)

## 2022-05-10 DIAGNOSIS — M6281 Muscle weakness (generalized): Secondary | ICD-10-CM

## 2022-05-10 DIAGNOSIS — R2681 Unsteadiness on feet: Secondary | ICD-10-CM | POA: Diagnosis present

## 2022-05-10 DIAGNOSIS — R293 Abnormal posture: Secondary | ICD-10-CM | POA: Insufficient documentation

## 2022-05-10 DIAGNOSIS — R251 Tremor, unspecified: Secondary | ICD-10-CM | POA: Diagnosis present

## 2022-05-10 DIAGNOSIS — R471 Dysarthria and anarthria: Secondary | ICD-10-CM | POA: Diagnosis present

## 2022-05-10 DIAGNOSIS — R41840 Attention and concentration deficit: Secondary | ICD-10-CM | POA: Insufficient documentation

## 2022-05-10 DIAGNOSIS — R41841 Cognitive communication deficit: Secondary | ICD-10-CM | POA: Diagnosis present

## 2022-05-10 DIAGNOSIS — R41844 Frontal lobe and executive function deficit: Secondary | ICD-10-CM | POA: Diagnosis present

## 2022-05-10 DIAGNOSIS — R2689 Other abnormalities of gait and mobility: Secondary | ICD-10-CM | POA: Insufficient documentation

## 2022-05-10 DIAGNOSIS — R1312 Dysphagia, oropharyngeal phase: Secondary | ICD-10-CM | POA: Diagnosis present

## 2022-05-10 DIAGNOSIS — R278 Other lack of coordination: Secondary | ICD-10-CM

## 2022-05-10 NOTE — Patient Instructions (Addendum)
   ROM: Abduction - Wand   Sitting Holding wand with left hand palm up, push wand directly out to side  Look at hand, leading with other hand palm down, until stretch is felt. Hold 2 seconds. Repeat 10 times per set. Do 1-2 sessions per day.  Repeat with other arm       Cane Overhead - Standing   Sitting, With arms straight, hold cane forward at waist. Raise cane above head. Hold 3 seconds.  Look at stick Repeat 10 times. Do 1-2 times per day.    Incline: Chest Press (Active)    Sit,  Extend elbows fully.  Then bring stick to chest with head up while squeezing shoulder blades back.   Complete 10 repetitions. Perform 1-2 sessions per day.     Stick/cane between 2 to 2.5 feet.

## 2022-05-10 NOTE — Therapy (Signed)
OUTPATIENT SPEECH LANGUAGE PATHOLOGY TREATMENT NOTE   Patient Name: Jordan [name] MRN: [id number] DOB:[birth year], 81 y.o., male Today's Date: 05/10/2022  PCP: [name] MD REFERRING PROVIDER: Izora Ribas, MD   END OF SESSION:   End of Session - 05/10/22 1254     Visit Number 3    Number of Visits 25    Date for SLP Re-Evaluation 07/16/22    Authorization Type [employer]  $20 co pay per day    SLP Start Time 1408   OT session ran over   SLP Stop Time  1451    SLP Time Calculation (min) 43 min    Activity Tolerance Patient tolerated treatment well             Past Medical History:  Diagnosis Date   Arthritis    R shoulder, bone spur   Arthritis    "hands" (03/25/2016)   Chronic diastolic heart failure (HCC)    Chronic lower back pain    Coronary heart disease    Dr Pernell Dupre   H/O cardiovascular stress test    perhaps last one was 2009   Heart murmur    12/29/11 echo: mild MR, no AS, trivial TR   History of gout    Hyperlipidemia    Hypertension    saw Dr. Linard Millers last earl;y- 2014, cardiac cath. last done ?2009, blocks seen didn't require any intervention at that point.   Narcolepsy    [name] 04-11-97; Mean Latency 1.20mn, SOREM 2   OSA on CPAP    NPSG 12-13-98 AHI 22.7   PONV (postoperative nausea and vomiting)    Presence of permanent cardiac pacemaker    Shortness of breath    with exertion    Stroke ([place]    Type II diabetes mellitus ([facility]    Past Surgical History:  Procedure Laterality Date   CARDIAC CATHETERIZATION  2009?   EP IMPLANTABLE DEVICE N/A 03/25/2016   Procedure: Pacemaker Implant - Dual Chamber;  Surgeon: GEvans Lance MD;  [facility];  Service: Cardiovascular;  Laterality: N/A;   FRACTURE SURGERY     INSERT / REPLACE / REMOVE PACEMAKER  03/24/2016   INTRAMEDULLARY (IM) NAIL INTERTROCHANTERIC Left 02/08/2022   Procedure: INTRAMEDULLARY (IM) NAIL INTERTROCHANTRIC;  Surgeon: DMeredith Pel MD;   [place] [facility]  Service: Orthopedics;  Laterality: Left;   IR KYPHO LUMBAR INC FX REDUCE BONE BX UNI/BIL CANNULATION INC/IMAGING  12/29/2021   IR RADIOLOGIST EVAL & MGMT  12/15/2021   LEFT AND RIGHT HEART CATHETERIZATION WITH CORONARY ANGIOGRAM N/A 06/05/2014   Procedure: LEFT AND RIGHT HEART CATHETERIZATION WITH CORONARY ANGIOGRAM;  Surgeon: HSinclair Grooms MD;  [facility];  Service: Cardiovascular;  Laterality: N/A;   NASAL FRACTURE SURGERY     SHOULDER ARTHROSCOPY WITH ROTATOR CUFF REPAIR AND SUBACROMIAL DECOMPRESSION Right 08/16/2013   Procedure: SHOULDER ARTHROSCOPY WITH ROTATOR CUFF REPAIR AND SUBACROMIAL DECOMPRESSION;  Surgeon: JNita Sells MD;  [place] [facility]  Service: Orthopedics;  Laterality: Right;  Right shoulder arthroscopy rotator cuff repair, subacromial decompression.   TONSILLECTOMY     Patient Active Problem List   Diagnosis Date Noted   Acute deep vein thrombosis (DVT) of left lower extremity ([place] 04/29/2022   Closed intertrochanteric fracture of left hip, initial encounter ([facility]    Closed left hip fracture ([facility] secondary to fall at home 02/08/2022   Paroxysmal atrial fibrillation ([place] 02/08/2022   Debility 01/08/2022   Constipation 01/07/2022  Physical deconditioning 40/34/7425   Acute metabolic encephalopathy 95/63/8756   Hypomagnesemia 01/04/2022   Pancreatitis 12/30/2021   Parkinson's disease ([place]) 12/21/2021   Parkinson disease ([place]) 12/18/2021   Frequent falls 12/18/2021   History of CVA (cerebrovascular accident) 12/18/2021   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   Hallucinations, visual 07/30/2021   Acetabulum fracture, left ([place]) 06/25/2021   Acute hip pain, left 06/24/2021   Fall at home, initial encounter 06/24/2021   Leukocytosis 06/24/2021   Chronic diastolic heart failure ([place])    Sinusitis, acute maxillary 06/24/2020   AKI (acute kidney injury) ([place])    Labile blood glucose    Cough    Benign essential HTN    Skin  lesion of hand    Hypoalbuminemia due to protein-calorie malnutrition ([place])    Controlled type 2 diabetes mellitus with hyperglycemia, without long-term current use of insulin (HCC)    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   Morbid obesity ([place])    Acute blood loss anemia    Acute thalamic infarction ([place]) 04/15/2020   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[place]) 05/25/2019   TIA (transient ischemic attack) 02/17/2018   Type 2 diabetes mellitus with hyperlipidemia ([place]) 02/17/2018   Insomnia 02/01/2018   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   Chronotropic incompetence 07/09/2014   Dyspnea on exertion 03/03/2014   HLD (hyperlipidemia) 12/13/2007   Essential hypertension 12/13/2007   Class 1 obesity 12/05/2007   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 No   OBJECTIVE:     TODAY'S TREATMENT:  05-10-22: Pt returned with Speak Out! Workbook. Endorsed his need for additional training due to reduced recall since last ST session. Pt also inquired how to determine about appropriate loudness in conversation, in which SLP utilized audio feedback to highlight decay in vocal intensity and clarity short 2 minute speech sample. Pt demonstrated increased sensory awareness of decline in volume and clarity. Re-educated Speak Out! Program principles and rationale to optimize carryover of dysarthria compensations. SLP leads pt through exercises providing usual model prior to pt execution to aid comprehension and completion. Occasional min to mod-A required to achieve targeted dB this date. Averages this  date: loud "ah" 88 dB (~5-8 seconds); reading 77 dB; cognitive speech task 75 dB. Intermittent side comments required usual cues to optimize vocal intensity from mid 60s dB to low 70s dB.  04-28-22: Re-educated rationale and principles of Speak Out! Program for hypokinetic dysarthria. Targeted improving vocal quality and increasing intensity through progressively difficulty speech tasks using Speak Out! program, lesson 1. ST leads pt through exercises providing usual model prior to pt execution. Occasional min to mod-A required to achieve target dB this date. Averages this date: loud "ah" 89 dB (~8 seconds); reading 75 dB; cognitive speech task 75 dB. Intermittent side comments required usual cues to optimize vocal intensity from mid 60s dB to low 70s dB.  04-21-22: Educated patient and wife on evaluation results and clinical observations. Both verbalized understanding and agreement with POC. Provided handout with Speak Out! Information to request free workbook.      PATIENT EDUCATION: Education details: see above Person educated: Patient and Spouse Education method: Explanation, Media planner, and Handouts Education  comprehension: verbalized understanding, returned demonstration, and needs further education     HOME EXERCISE PROGRAM: Order Speak Out! book     GOALS: Goals reviewed with patient? Yes   SHORT TERM GOALS: Target date: 05/19/2022     Pt will complete Speak Out! HEP at least 1x/day given occasional min A over 2 sessions  Baseline: Goal status: ongoing   2.  Pt will achieve targeted dB (85-90 dB) on warm up exercises with 80% accuracy given occasional min A over 2 sessions  Baseline:  Goal status: ongoing   3.  Pt will achieve targeted dB (75-85 dB) on reading exercises with 80% accuracy given occasional min A over 2 sessions Baseline: 05-10-22 Goal status: ongoing   4.  Pt will achieve targeted dB (72-78 dB) on cognitive exercises with 80% accuracy given occasional min A  over 2 sessions Baseline: 05-10-22 Goal status: ongoing   5.  Pt will utilize dysarthria compensations in 5-10 minute conversation to optimize vocal intensity and clarity given occasional mod A over 2 sessions  Baseline:  Goal status: ongoing   6.  Pt will implement memory/attention compensations to aid daily functioning given occasional mod A over 2 sessions  Baseline:  Goal status: ongoing   LONG TERM GOALS: Target date: 07/16/2022    Pt will complete Speak Out! HEP at least 1x/day (BID recommended) > 1 week  Baseline:  Goal status: ongoing   2.  Pt will achieve targeted dB levels in demonstration of Speak Out! Lessons with 90% accuracy given rare min A over 2 sessions  Baseline:  Goal status: ongoing   3.  Pt will utilize dysarthria compensations in 15+ minute conversation to optimize vocal intensity and clarity given occasional min A over 2 sessions Baseline:  Goal status: ongoing   4.  Pt will implement memory/attention compensations to aid daily functioning given occasional min A over 2 sessions  Baseline:  Goal status: ongoing   5.  Pt will report improved communication effectiveness via PROM by 2 point improvement by last ST session  Baseline: CPIB=26 Goal status: ongoing   ASSESSMENT:   CLINICAL IMPRESSION: Patient is a 81 y.o. male who was seen today for previous thalamic stroke and Parkinson's disease. 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 Conducted education and training of Speak Out! Program to optimize vocal intensity and clarity. Skilled ST is warranted to address dysarthria and cognition to maximize communication effectiveness and functional independence.    OBJECTIVE IMPAIRMENTS  Objective impairments include attention, memory, and dysarthria. These impairments are limiting patient from household responsibilities and effectively communicating at home and in community.Factors affecting  potential to achieve goals and functional outcome are medical prognosis and previous level of function. Patient will benefit from skilled SLP services to address above impairments and improve overall function.   REHAB POTENTIAL: Good   PLAN: SLP FREQUENCY: 2x/week   SLP DURATION: 12 weeks   PLANNED INTERVENTIONS: Cueing hierachy, Cognitive reorganization, Internal/external aids, Functional tasks, Multimodal communication approach, SLP instruction and feedback, Compensatory strategies, and Patient/family education  Marzetta Board, CCC-SLP 05/10/2022, 3:29 PM

## 2022-05-10 NOTE — Therapy (Signed)
OUTPATIENT PHYSICAL THERAPY TREATMENT NOTE   Patient Name: Jordan [name] MRN: [id number] DOB:[birth year], 81 y.o., male Today's Date: 05/10/2022  PCP: [name], MD REFERRING PROVIDER: [name], MD  END OF SESSION:   PT End of Session - 05/10/22 1240     Visit Number 3    Number of Visits 17    Date for PT Re-Evaluation 07/21/22    Authorization Type [employer]    PT Start Time 1237   patient late   PT Stop Time 1315    PT Time Calculation (min) 38 min    Equipment Utilized During Treatment Gait belt    Activity Tolerance Patient tolerated treatment well    Behavior During Therapy WFL for tasks assessed/performed             Past Medical History:  Diagnosis Date   Arthritis    R shoulder, bone spur   Arthritis    "hands" (03/25/2016)   Chronic diastolic heart failure (HCC)    Chronic lower back pain    Coronary heart disease    Dr Pernell Dupre   H/O cardiovascular stress test    perhaps last one was 2009   Heart murmur    12/29/11 echo: mild MR, no AS, trivial TR   History of gout    Hyperlipidemia    Hypertension    saw Dr. Linard Millers last earl;y- 2014, cardiac cath. last done ?2009, blocks seen didn't require any intervention at that point.   Narcolepsy    [name] 04-11-97; Mean Latency 1.32mn, SOREM 2   OSA on CPAP    NPSG 12-13-98 AHI 22.7   PONV (postoperative nausea and vomiting)    Presence of permanent cardiac pacemaker    Shortness of breath    with exertion    Stroke ([place]    Type II diabetes mellitus ([place]    Past Surgical History:  Procedure Laterality Date   CARDIAC CATHETERIZATION  2009?   EP IMPLANTABLE DEVICE N/A 03/25/2016   Procedure: Pacemaker Implant - Dual Chamber;  Surgeon: GEvans Lance MD;  [facility];  Service: Cardiovascular;  Laterality: N/A;   FRACTURE SURGERY     INSERT / REPLACE / REMOVE PACEMAKER  03/24/2016   INTRAMEDULLARY (IM) NAIL INTERTROCHANTERIC Left 02/08/2022   Procedure: INTRAMEDULLARY  (IM) NAIL INTERTROCHANTRIC;  Surgeon: DMeredith Pel MD;  [facility]  Service: Orthopedics;  Laterality: Left;   IR KYPHO LUMBAR INC FX REDUCE BONE BX UNI/BIL CANNULATION INC/IMAGING  12/29/2021   IR RADIOLOGIST EVAL & MGMT  12/15/2021   LEFT AND RIGHT HEART CATHETERIZATION WITH CORONARY ANGIOGRAM N/A 06/05/2014   Procedure: LEFT AND RIGHT HEART CATHETERIZATION WITH CORONARY ANGIOGRAM;  Surgeon: HSinclair Grooms MD;  [facility];  Service: Cardiovascular;  Laterality: N/A;   NASAL FRACTURE SURGERY     SHOULDER ARTHROSCOPY WITH ROTATOR CUFF REPAIR AND SUBACROMIAL DECOMPRESSION Right 08/16/2013   Procedure: SHOULDER ARTHROSCOPY WITH ROTATOR CUFF REPAIR AND SUBACROMIAL DECOMPRESSION;  Surgeon: JNita Sells MD;  [facility]  Service: Orthopedics;  Laterality: Right;  Right shoulder arthroscopy rotator cuff repair, subacromial decompression.   TONSILLECTOMY     Patient Active Problem List   Diagnosis Date Noted   Acute deep vein thrombosis (DVT) of left lower extremity ([place] 04/29/2022   Closed intertrochanteric fracture of left hip, initial encounter ([place]    Closed left hip fracture ([place] secondary to fall at home 02/08/2022   Paroxysmal atrial fibrillation ([place] 02/08/2022  Debility 01/08/2022   Constipation 01/07/2022   Physical deconditioning 01/60/1093   Acute metabolic encephalopathy 23/55/7322   Hypomagnesemia 01/04/2022   Pancreatitis 12/30/2021   Parkinson's disease ([place]) 12/21/2021   Parkinson disease ([place]) 12/18/2021   Frequent falls 12/18/2021   History of CVA (cerebrovascular accident) 12/18/2021   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   Hallucinations, visual 07/30/2021   Acetabulum fracture, left ([place]) 06/25/2021   Acute hip pain, left 06/24/2021   Fall at home, initial encounter 06/24/2021   Leukocytosis 06/24/2021   Chronic diastolic heart failure ([place])    Sinusitis, acute maxillary 06/24/2020   AKI (acute kidney injury) ([place])     Labile blood glucose    Cough    Benign essential HTN    Skin lesion of hand    Hypoalbuminemia due to protein-calorie malnutrition ([place])    Controlled type 2 diabetes mellitus with hyperglycemia, without long-term current use of insulin ([place])    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   Morbid obesity ([place])    Acute blood loss anemia    Acute thalamic infarction ([place]) 04/15/2020   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[place]) 05/25/2019   TIA (transient ischemic attack) 02/17/2018   Type 2 diabetes mellitus with hyperlipidemia ([place]) 02/17/2018   Insomnia 02/01/2018   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   Chronotropic incompetence 07/09/2014   Dyspnea on exertion 03/03/2014   HLD (hyperlipidemia) 12/13/2007   Essential hypertension 12/13/2007   Class 1 obesity 12/05/2007   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   Coronary atherosclerosis 12/05/2007    REFERRING DIAG: I63.81 (ICD-10-CM) - 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[place]) G20 (ICD-10-CM) - Parkinson's disease ([place]) S72.145S (ICD-10-CM) - Type III open nondisplaced intertrochanteric fracture of left femur, sequela    THERAPY DIAG:  Muscle weakness (generalized)  Unsteadiness on feet  Abnormal posture  Rationale for Evaluation and Treatment Rehabilitation  PERTINENT HISTORY: PMHx: PD (diagnosed Jan 2022), HLD, OSA, DM, HTN, chronic diastolic HF, CVA, frequent falls, L2 compression fx, L hip fx   PRECAUTIONS: Fall and ICD/Pacemaker, history of compression fracture at L2 with recent kyphoplasty   SUBJECTIVE: Patient reports doing well- no falls or near falls. Patient with thigh-high TED hose on L LE only since d/c from ED due to DVT.   PAIN:  Are you having pain? No VITALS:   -110/73  TODAY'S TREATMENT:  Gait:  -46f x4 RW + CGA (prolonged seated rest break after each bout)   NMR:  -toe taps to 6" box, 2x6 B UE support + MinA   PATIENT EDUCATION: Education details: DVT prevention with mobility, HEP Person  educated: Patient and Spouse Education method: Explanation Education comprehension: verbalized understanding     HOME EXERCISE PROGRAM: Access Code: [email or web address]: [email or web address] Date: 04/23/2022 Prepared by: JEstevan Ryder Exercises - Clamshell  - 1 x daily - 7 x weekly - 3 sets - 10 reps - Seated Scapular Retraction  - 1 x daily - 7 x weekly - 3 sets - 10 reps - Seated Long Arc Quad  - 1 x daily - 7 x weekly - 3 sets - 10 reps - Seated Hamstring Stretch  - 1 x daily - 7 x weekly - 3 sets - 10 reps - Sit to Stand  - 1 x daily - 7 x weekly - 3 sets - 10 reps - Seated Heel Raise  - 1 x daily - 7 x weekly - 3 sets - 10 reps - Seated March  - 1 x daily - 7 x weekly - 3 sets -  10 reps - Seated Flexion Stretch with Swiss Ball  - 1 x daily - 7 x weekly - 3 sets - 10 reps          GOALS: Goals reviewed with patient? Yes   SHORT TERM GOALS: Target date: 05/19/2022   Pt will be able to perform initial HEP for strengthening and balance to continue gains with supervision of wife. Baseline: Goal status: INITIAL   2.  BERG to be assessed with STG/LTG written.  Baseline:  Goal status: INITIAL   3.  Pt will improve 5x sit<>stand to less than or equal to 19 sec without UE support to demonstrate improved functional strength and transfer efficiency.   Baseline: 22.6 seconds without UE support from chair Goal status: INITIAL   4.  Pt will ambulate >350' with RW mod I for improved household and short community distances. Baseline:  Goal status: INITIAL   5.  Pt will improve gait speed with RW to at least 1.3 ft/sec in order to demo improved community mobility.    Baseline:  .87 ft/sec with RW Goal status: INITIAL       LONG TERM GOALS: Target date: 06/16/2022     Pt will be independent with final HEP for strength,gait, balance in order to build upon functional gains made in therapy.   Baseline:  Goal status: INITIAL   2.  BERG goal to be written as  appropriate in order to demo decr fall risk.  Baseline:  Goal status: INITIAL   3.  Pt will improve TUG time with RW vs. LRAD to 26 seconds or less in order to demo decrease fall risk.   Baseline: 33.32 seconds with RW  Goal status: INITIAL   4.  Pt will improve gait speed with RW vs. LRAD to at least 1.9 ft/sec in order to demo improved community mobility.  Baseline: .87 ft/sec with RW Goal status: INITIAL   5.  Pt will ambulate >500' with RW versus LRAD on paved surfaces with supervision for improved community mobility. Baseline:  Goal status: INITIAL     ASSESSMENT:   CLINICAL IMPRESSION: Patient seen for skilled PT session with emphasis on functional strengthening and gait. He reports decreased strength in L LE since DVT with inability to lift leg high enough to get into shower today. He also reports increased impairment noted in R LE when ambulating related to his PD. Patient requires frequent and consistent verbal cues for upright posture and appropriate B knee extension in stance. He remains limited by fatigue and decreased endurance for all functional mobility. Improving foot clearance with visual cue to aim foot toward theraband on the walker. Continue POC.    OBJECTIVE IMPAIRMENTS Abnormal gait, decreased activity tolerance, decreased balance, decreased coordination, decreased endurance, decreased knowledge of use of DME, decreased mobility, decreased ROM, decreased strength, impaired flexibility, postural dysfunction, and pain.    ACTIVITY LIMITATIONS cleaning, community activity, driving, meal prep, laundry, yard work, and shopping.    PERSONAL FACTORS Age, Past/current experiences, Time since onset of injury/illness/exacerbation, and 3+ comorbidities:  PD, HLD, OSA, DM, HTN, chronic diastolic HF, CVA, frequent falls, L2 compression fx, L hip fx   are also affecting patient's functional outcome.      REHAB POTENTIAL: Good   CLINICAL DECISION MAKING: Evolving/moderate  complexity   EVALUATION COMPLEXITY: Moderate   PLAN: PT FREQUENCY: 2x/week   PT DURATION: 12 weeks, plus eval   PLANNED INTERVENTIONS: Therapeutic exercises, Therapeutic activity, Neuromuscular re-education, Balance training, Gait training, Patient/Family education, Joint  mobilization, Stair training, Vestibular training, Canalith repositioning, DME instructions, and Manual therapy   PLAN FOR NEXT SESSION: posture, functional strength/endurance, add balance to HEP, gait training, weight shifting to L  Debbora Dus, PT Debbora Dus, PT, DPT, CBIS  05/10/2022, 1:37 PM

## 2022-05-13 NOTE — Progress Notes (Signed)
Remote pacemaker transmission.   

## 2022-05-14 ENCOUNTER — Ambulatory Visit: Payer: Medicare (Managed Care) | Admitting: Physical Therapy

## 2022-05-14 ENCOUNTER — Ambulatory Visit: Payer: Medicare (Managed Care) | Admitting: Speech Pathology

## 2022-05-14 ENCOUNTER — Ambulatory Visit: Payer: Medicare (Managed Care) | Admitting: Occupational Therapy

## 2022-05-14 DIAGNOSIS — R2681 Unsteadiness on feet: Secondary | ICD-10-CM

## 2022-05-14 DIAGNOSIS — R41840 Attention and concentration deficit: Secondary | ICD-10-CM

## 2022-05-14 DIAGNOSIS — M6281 Muscle weakness (generalized): Secondary | ICD-10-CM

## 2022-05-14 DIAGNOSIS — R41841 Cognitive communication deficit: Secondary | ICD-10-CM

## 2022-05-14 DIAGNOSIS — R278 Other lack of coordination: Secondary | ICD-10-CM

## 2022-05-14 DIAGNOSIS — R471 Dysarthria and anarthria: Secondary | ICD-10-CM

## 2022-05-14 DIAGNOSIS — R41844 Frontal lobe and executive function deficit: Secondary | ICD-10-CM

## 2022-05-14 DIAGNOSIS — R251 Tremor, unspecified: Secondary | ICD-10-CM

## 2022-05-14 DIAGNOSIS — R2689 Other abnormalities of gait and mobility: Secondary | ICD-10-CM

## 2022-05-14 DIAGNOSIS — R293 Abnormal posture: Secondary | ICD-10-CM

## 2022-05-14 NOTE — Therapy (Signed)
OUTPATIENT OCCUPATIONAL THERAPY [facility]  Patient Name: Jordan [name] MRN: [id number] DOB:Nov 20, 1940, 81 y.o., male Today's Date: 05/14/2022  PCP: Dr. Kenton [name] REFERRING PROVIDER: Dr. Ranell [name]   OT End of Session - 05/14/22 1309     Visit Number 4    Number of Visits 24    Date for OT Re-Evaluation 07/15/22    Authorization Type [employer] Medicare    Authorization - Visit Number 4    Authorization - Number of Visits 10    OT Start Time 5366    OT Stop Time 1313    OT Time Calculation (min) 40 min    Activity Tolerance Patient tolerated treatment well    Behavior During Therapy [facility] for tasks assessed/performed               Past Medical History:  Diagnosis Date   Arthritis    R shoulder, bone spur   Arthritis    "hands" (03/25/2016)   Chronic diastolic heart failure (HCC)    Chronic lower back pain    Coronary heart disease    Dr Jordan [name]   H/O cardiovascular stress test    perhaps last one was 2009   Heart murmur    12/29/11 echo: mild MR, no AS, trivial TR   History of gout    Hyperlipidemia    Hypertension    saw Dr. Linard [name] last earl;y- 2014, cardiac cath. last done ?2009, blocks seen didn't require any intervention at that point.   Narcolepsy    [name] 04-11-97; Mean Latency 1.104mn, SOREM 2   OSA on CPAP    NPSG 12-13-98 AHI 22.7   PONV (postoperative nausea and vomiting)    Presence of permanent cardiac pacemaker    Shortness of breath    with exertion    Stroke ([place]    Type II diabetes mellitus (Jordan [name]    Past Surgical History:  Procedure Laterality Date   CARDIAC CATHETERIZATION  2009?   EP IMPLANTABLE DEVICE N/A 03/25/2016   Procedure: Pacemaker Implant - Dual Chamber;  Surgeon: GEvans Lance [name];  [facility];  Service: Cardiovascular;  Laterality: N/A;   FRACTURE SURGERY     INSERT / REPLACE / REMOVE PACEMAKER  03/24/2016   INTRAMEDULLARY (IM) NAIL INTERTROCHANTERIC Left 02/08/2022   Procedure: INTRAMEDULLARY (IM) NAIL  INTERTROCHANTRIC;  Surgeon: Jordan Pel [name];  [facility]  Service: Orthopedics;  Laterality: Left;   IR KYPHO LUMBAR INC FX REDUCE BONE BX UNI/BIL CANNULATION INC/IMAGING  12/29/2021   IR RADIOLOGIST EVAL & MGMT  12/15/2021   LEFT AND RIGHT HEART CATHETERIZATION WITH CORONARY ANGIOGRAM N/A 06/05/2014   Procedure: LEFT AND RIGHT HEART CATHETERIZATION WITH CORONARY ANGIOGRAM;  Surgeon: HSinclair Grooms [name];  [facility];  Service: Cardiovascular;  Laterality: N/A;   NASAL FRACTURE SURGERY     SHOULDER ARTHROSCOPY WITH ROTATOR CUFF REPAIR AND SUBACROMIAL DECOMPRESSION Right 08/16/2013   Procedure: SHOULDER ARTHROSCOPY WITH ROTATOR CUFF REPAIR AND SUBACROMIAL DECOMPRESSION;  Surgeon: Jordan [name];  [place] [facility]  Service: Orthopedics;  Laterality: Right;  Right shoulder arthroscopy rotator cuff repair, subacromial decompression.   TONSILLECTOMY     Patient Active Problem List   Diagnosis Date Noted   Acute deep vein thrombosis (DVT) of left lower extremity ([place] 04/29/2022   Closed intertrochanteric fracture of left hip, initial encounter ([place]    Closed left hip fracture ([place] secondary to fall at home 02/08/2022   Paroxysmal atrial fibrillation ([place] 02/08/2022  Debility 01/08/2022   Constipation 01/07/2022   Physical deconditioning 12/75/1700   Acute metabolic encephalopathy 17/49/4496   Hypomagnesemia 01/04/2022   Pancreatitis 12/30/2021   Parkinson's disease ([place]) 12/21/2021   Parkinson disease ([place]) 12/18/2021   Frequent falls 12/18/2021   History of CVA (cerebrovascular accident) 12/18/2021   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   Hallucinations, visual 07/30/2021   Acetabulum fracture, left ([place]) 06/25/2021   Acute hip pain, left 06/24/2021   Fall at home, initial encounter 06/24/2021   Leukocytosis 06/24/2021   Chronic diastolic heart failure ([place])    Sinusitis, acute maxillary 06/24/2020   AKI (acute kidney injury) ([place])    Labile blood  glucose    Cough    Benign essential HTN    Skin lesion of hand    Hypoalbuminemia due to protein-calorie malnutrition ([place])    Controlled type 2 diabetes mellitus with hyperglycemia, without long-term current use of insulin (HCC)    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  Wife reports that pt was hospitalized for blood clot in L leg and d/c instructions were that he was able to resume OT/PT after 1 week.  (D/c 04/30/22 with d/c instructions stating for pt to resume OT/PT).  Wife present for session.   PERTINENT HISTORY: Jordan [name] is an 81 year old male with history of CAD/chronic diastolic CHF, OSA, P5FF, gout, Parkinson's diease with recent hospital admission 02/17-02-22-23 worsening of gait disorder with falls with hallucinations which was treated with medication adjustment and he was discharged to home. He was readmitted on 12/30/21  with abdominal pain with low grade fever, leucocytosis, lactic acidosis, mild confusion and lethargy secondary to sepsis. He was found to have elevated lipase-165  with inflammatory changes suggestive of primary pancreatitis with secondary inflammation of hepatic flexure of colon or primary sigmoid diverticulitis with secondary extension to pancreatic tail. He was treated with IVF and broad spectrum antibiotics.   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 Pt fell and sustained hip fx on 02/08/22 and pt underwent left  IM nailing.  04/30/22:  hospitalized with blood clot LLE--changed meds and cleared to resume OT/PT.   Pt accompanied by: self and significant other, spouse, [name]    PRECAUTIONS: Fall and Other: , hx of back kyphoplasty  , pacemaker, hx of L hip fx, s/p IM nailing, hx of shoulder dislocations, hx of LLE DVT  WEIGHT BEARING RESTRICTIONS No  PAIN:  Are you having pain?  Abdominal pain, 2/10, aggravating and alleviating factors are unknown  FALLS: Has patient fallen in last 6 months? Yes. Number of falls 2-3   PLOF: Independent prior to CVA in 2021  PATIENT GOALS to be more independent, pt's wife states he would like to be able to drive  OBJECTIVE:   TODAY'S TREATMENT:  Pt performed cane exercises for shoulder flexion, abduction, and chest press, 15 reps each, min v.c for positioning and full elbow extension Pt  requested to practice cutting food again with fork, then knife, min v.c and increased time required, Pt placed grooved pegs into pegboard with RUE, increased time and mod difficulty due to decreased fine motor coordination, min-mod v.c for opening hand.    PATIENT EDUCATION: Education details: 05/14/22 reviewed cane HEP issued last visit; reviewed strategies for cutting food with knife and side of fork with big movements Person educated: Patient, caregiver Education method: Explanation,  demonstration, verbal cues, tactile cues, handout Education comprehension: verbalized understanding, returned demonstration, verbal cues, min tactile cues   HOME EXERCISE PROGRAM: Coordination 05/10/22:  cane HEP (shoulder flex, chest press, and shoulder abduction); strategies for cutting food and donning shirt    GOALS: Goals reviewed with patient? No  SHORT TERM GOALS: Target date: 05/14/22  I with initial HEP Baseline: Goal status: INITIAL  2.  Pt will verbalize understanding of adapted strategies to maximize safety and I with ADLs/ IADLs (TF:[id number] food) Baseline:  Goal status: INITIAL  3.  Pt will demonstrate improved UE functional use as evidenced by increasing LUE box/ blocks score by 3 blocks. Baseline: RUE 45, LUE 37 Goal status: inital  4.  Pt will perform LB dressing with min A demonstrating good safety awareness. Baseline: mod-max A Goal status: INITIAL  5.  Pt will consistently donn pullover shirt mod I in a reasonable amount of time Baseline: increased time required and prn min assist Goal status: INITIAL  6.   Pt will verbalize understanding of ways to prevent future PD related complications and PD community resources. Goal status: INITIAL  7.. Pt will retrieve items from overhead shelf at 95* with right UE with -15 elbow extension Baseline: 95, -20 Goal status: INITIAL  8. Pt will retrieve items from overhead shelf at 110 with right UE with -10 elbow extension Baseline: 110, -15 Goal status: INITIAL  LONG TERM GOALS: Target date: 07/15/2022    Pt will demonstrate understanding of memory compensations and ways to keep thinking skills sharp  Goal status: INITIAL  2.  Pt will demonstrate improved fine motor coordination for ADLs as evidenced by decreasing 9 hole peg test score for bilateral UE's by 3 secs Baseline: RUE 42.47, LUE 42.25 Goal status: INITIAL  3.  Pt will demonstrate improved ease with feeding as evidenced by decreasing PPT#2 (self  feeding) by 3 secs Baseline: 16.66 secs Goal status: INITIAL  4.  Pt will demonstrate improved ease with fastening buttons as evidenced by decreasing 3 button/ unbutton time to : 95 secs Baseline: 1 min 45 secs Goal status: INITIAL  5.  Pt will write a short paragraph with 100% legibility and no significant decrease in letter size Baseline: mild micrographia Goal status: INITIAL  6.  Assess test PPT#4 and set goal prn Goal status: INITIAL   ASSESSMENT:  CLINICAL IMPRESSION: Pt is progressing towards goals.  Pt demonstrates progress with HEP, and cutting food.  PERFORMANCE DEFICITS in functional skills including ADLs, IADLs, coordination, dexterity, sensation, ROM, strength, pain, flexibility, FMC, GMC, mobility, balance, endurance, decreased knowledge of  precautions, decreased knowledge of use of DME, UE functional use, and bradykinesia, cognitive skills including attention, learn, memory, problem solving, safety awareness, and thought, and psychosocial skills including coping strategies, environmental adaptation, habits, interpersonal interactions, and routines and behaviors.   IMPAIRMENTS are limiting patient from ADLs, IADLs, rest and sleep, play, leisure, and social participation.   COMORBIDITIES may have co-morbidities  that affects occupational performance. Patient will benefit from skilled OT to address above impairments and improve overall function.  MODIFICATION OR ASSISTANCE TO COMPLETE EVALUATION: Min-Moderate modification of tasks or assist with assess necessary to complete an evaluation.  OT OCCUPATIONAL PROFILE AND HISTORY: Detailed assessment: Review of records and additional review of physical, cognitive, psychosocial history related to current functional performance.  CLINICAL DECISION MAKING: Moderate - several treatment options, min-mod task modification necessary  REHAB POTENTIAL: Good  EVALUATION COMPLEXITY: Moderate       PLAN: OT FREQUENCY:  2x/week  OT DURATION: 12 weeks plus eval  PLANNED INTERVENTIONS: self care/ADL training, therapeutic exercise, therapeutic activity, neuromuscular re-education, manual therapy, passive range of motion, balance training, functional mobility training, aquatic therapy, ultrasound, paraffin, fluidotherapy, moist heat, cryotherapy, contrast bath, patient/family education, cognitive remediation/compensation, visual/perceptual remediation/compensation, energy conservation, coping strategies training, and DME and/or AE instructions  RECOMMENDED OTHER SERVICES: n/a  CONSULTED AND AGREED WITH PLAN OF CARE: Patient and family member/caregiver  PLAN FOR NEXT SESSION:   ADL/ big movement strategies, coordination   Samiah Ricklefs, OTR/L 05/14/2022, 1:46 PM  [phone] Phone: [phone] 1:46 PM 05/14/22

## 2022-05-14 NOTE — Therapy (Signed)
OUTPATIENT SPEECH LANGUAGE PATHOLOGY TREATMENT NOTE   Patient Name: Jordan [name] MRN: [id number] DOB:[birth year], 81 y.o., male Today's Date: 05/14/2022  PCP: [name] MD REFERRING PROVIDER: Izora Ribas, MD   END OF SESSION:   End of Session - 05/14/22 1251     Visit Number 4    Number of Visits 25    Date for SLP Re-Evaluation 07/16/22    Authorization Type [employer]  $20 co pay per day    SLP Start Time 1315    SLP Stop Time  1400    SLP Time Calculation (min) 45 min    Activity Tolerance Patient tolerated treatment well              Past Medical History:  Diagnosis Date   Arthritis    R shoulder, bone spur   Arthritis    "hands" (03/25/2016)   Chronic diastolic heart failure (HCC)    Chronic lower back pain    Coronary heart disease    Dr Pernell Dupre   H/O cardiovascular stress test    perhaps last one was 2009   Heart murmur    12/29/11 echo: mild MR, no AS, trivial TR   History of gout    Hyperlipidemia    Hypertension    saw Dr. Linard Millers last earl;y- 2014, cardiac cath. last done ?2009, blocks seen didn't require any intervention at that point.   Narcolepsy    [name] 04-11-97; Mean Latency 1.45mn, SOREM 2   OSA on CPAP    NPSG 12-13-98 AHI 22.7   PONV (postoperative nausea and vomiting)    Presence of permanent cardiac pacemaker    Shortness of breath    with exertion    Stroke ([place]    Type II diabetes mellitus ([place]    Past Surgical History:  Procedure Laterality Date   CARDIAC CATHETERIZATION  2009?   EP IMPLANTABLE DEVICE N/A 03/25/2016   Procedure: Pacemaker Implant - Dual Chamber;  Surgeon: GEvans Lance MD;  [facility];  Service: Cardiovascular;  Laterality: N/A;   FRACTURE SURGERY     INSERT / REPLACE / REMOVE PACEMAKER  03/24/2016   INTRAMEDULLARY (IM) NAIL INTERTROCHANTERIC Left 02/08/2022   Procedure: INTRAMEDULLARY (IM) NAIL INTERTROCHANTRIC;  Surgeon: DMeredith Pel MD;  [facility]   Service: Orthopedics;  Laterality: Left;   IR KYPHO LUMBAR INC FX REDUCE BONE BX UNI/BIL CANNULATION INC/IMAGING  12/29/2021   IR RADIOLOGIST EVAL & MGMT  12/15/2021   LEFT AND RIGHT HEART CATHETERIZATION WITH CORONARY ANGIOGRAM N/A 06/05/2014   Procedure: LEFT AND RIGHT HEART CATHETERIZATION WITH CORONARY ANGIOGRAM;  Surgeon: HSinclair Grooms MD;  [facility];  Service: Cardiovascular;  Laterality: N/A;   NASAL FRACTURE SURGERY     SHOULDER ARTHROSCOPY WITH ROTATOR CUFF REPAIR AND SUBACROMIAL DECOMPRESSION Right 08/16/2013   Procedure: SHOULDER ARTHROSCOPY WITH ROTATOR CUFF REPAIR AND SUBACROMIAL DECOMPRESSION;  Surgeon: JNita Sells MD;  [facility]  Service: Orthopedics;  Laterality: Right;  Right shoulder arthroscopy rotator cuff repair, subacromial decompression.   TONSILLECTOMY     Patient Active Problem List   Diagnosis Date Noted   Acute deep vein thrombosis (DVT) of left lower extremity ([place] 04/29/2022   Closed intertrochanteric fracture of left hip, initial encounter ([facility]    Closed left hip fracture ([place] secondary to fall at home 02/08/2022   Paroxysmal atrial fibrillation ([place] 02/08/2022   Debility 01/08/2022   Constipation 01/07/2022   Physical deconditioning 01/07/2022  Acute metabolic encephalopathy 29/51/8841   Hypomagnesemia 01/04/2022   Pancreatitis 12/30/2021   Parkinson's disease ([place]) 12/21/2021   Parkinson disease ([place]) 12/18/2021   Frequent falls 12/18/2021   History of CVA (cerebrovascular accident) 12/18/2021   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   Hallucinations, visual 07/30/2021   Acetabulum fracture, left ([place]) 06/25/2021   Acute hip pain, left 06/24/2021   Fall at home, initial encounter 06/24/2021   Leukocytosis 06/24/2021   Chronic diastolic heart failure ([place])    Sinusitis, acute maxillary 06/24/2020   AKI (acute kidney injury) ([place])    Labile blood glucose    Cough    Benign essential HTN    Skin lesion of hand     Hypoalbuminemia due to protein-calorie malnutrition ([employer])    Controlled type 2 diabetes mellitus with hyperglycemia, without long-term current use of insulin (HCC)    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   Morbid obesity ([place])    Acute blood loss anemia    Acute thalamic infarction ([place]) 04/15/2020   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[place]) 05/25/2019   TIA (transient ischemic attack) 02/17/2018   Type 2 diabetes mellitus with hyperlipidemia ([place]) 02/17/2018   Insomnia 02/01/2018   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   Chronotropic incompetence 07/09/2014   Dyspnea on exertion 03/03/2014   HLD (hyperlipidemia) 12/13/2007   Essential hypertension 12/13/2007   Class 1 obesity 12/05/2007   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 program   PAIN:  Are you having pain? No   OBJECTIVE:     TODAY'S TREATMENT:  05-14-22: Pt with mild sub optimal volume upon entering therapy session, mid 60 dB. Awareness exhibited of low volume with pt able to correct with min-A from SLP. Pt questions overall purpose of ST. Generated personal goal of being able to project voice during reenactments or sharing a presentation on a revolutionary war story. Pt with questions regarding program execution, SLP provides written instructions answering all questions, models exercises for pt, and provides verbal feedback. Following, SLP leads pt through exercises providing usual model prior to pt execution to aid comprehension and completion. Occasional min to mod-A required to achieve  targeted dB this date. Averages this date: loud "ah" 86 dB; counting 82 dB; reading  dB; cognitive speech task 82 dB. Intermittent side comments required usual cues to optimize vocal intensity from mid 60s dB to low 70s dB.  05-10-22: Pt returned with Speak Out! Workbook. Endorsed his need for additional training due to reduced recall since last ST session. Pt also inquired how to determine about appropriate loudness in conversation, in which SLP utilized audio feedback to highlight decay in vocal intensity and clarity short 2 minute speech sample. Pt demonstrated increased sensory awareness of decline in volume and clarity. Re-educated Speak Out! Program principles and rationale to optimize carryover of dysarthria compensations. SLP leads pt through exercises providing usual model prior to pt execution to aid comprehension and completion. Occasional min to mod-A required to achieve targeted dB this date. Averages this date: loud "ah" 88 dB (~5-8 seconds); reading 77 dB; cognitive speech task 75 dB. Intermittent side comments required usual cues to optimize vocal intensity from mid 60s dB  to low 70s dB.  04-28-22: Re-educated rationale and principles of Speak Out! Program for hypokinetic dysarthria. Targeted improving vocal quality and increasing intensity through progressively difficulty speech tasks using Speak Out! program, lesson 1. ST leads pt through exercises providing usual model prior to pt execution. Occasional min to mod-A required to achieve target dB this date. Averages this date: loud "ah" 89 dB (~8 seconds); reading 75 dB; cognitive speech task 75 dB. Intermittent side comments required usual cues to optimize vocal intensity from mid 60s dB to low 70s dB.  04-21-22: Educated patient and wife on evaluation results and clinical observations. Both verbalized understanding and agreement with POC. Provided handout with Speak Out! Information to request free workbook.      PATIENT  EDUCATION: Education details: see above Person educated: Patient and Spouse Education method: Explanation, Demonstration, and Handouts Education comprehension: verbalized understanding, returned demonstration, and needs further education     HOME EXERCISE PROGRAM: Order Speak Out! book     GOALS: Goals reviewed with patient? Yes   SHORT TERM GOALS: Target date: 05/19/2022     Pt will complete Speak Out! HEP at least 1x/day given occasional min A over 2 sessions  Baseline: Goal status: ongoing   2.  Pt will achieve targeted dB (85-90 dB) on warm up exercises with 80% accuracy given occasional min A over 2 sessions  Baseline:  Goal status: ongoing   3.  Pt will achieve targeted dB (75-85 dB) on reading exercises with 80% accuracy given occasional min A over 2 sessions Baseline: 05-10-22 Goal status: ongoing   4.  Pt will achieve targeted dB (72-78 dB) on cognitive exercises with 80% accuracy given occasional min A over 2 sessions Baseline: 05-10-22 Goal status: ongoing   5.  Pt will utilize dysarthria compensations in 5-10 minute conversation to optimize vocal intensity and clarity given occasional mod A over 2 sessions  Baseline:  Goal status: ongoing   6.  Pt will implement memory/attention compensations to aid daily functioning given occasional mod A over 2 sessions  Baseline:  Goal status: ongoing   LONG TERM GOALS: Target date: 07/16/2022    Pt will complete Speak Out! HEP at least 1x/day (BID recommended) > 1 week  Baseline:  Goal status: ongoing   2.  Pt will achieve targeted dB levels in demonstration of Speak Out! Lessons with 90% accuracy given rare min A over 2 sessions  Baseline:  Goal status: ongoing   3.  Pt will utilize dysarthria compensations in 15+ minute conversation to optimize vocal intensity and clarity given occasional min A over 2 sessions Baseline:  Goal status: ongoing   4.  Pt will implement memory/attention compensations to aid daily  functioning given occasional min A over 2 sessions  Baseline:  Goal status: ongoing   5.  Pt will report improved communication effectiveness via PROM by 2 point improvement by last ST session  Baseline: CPIB=26 Goal status: ongoing   ASSESSMENT:   CLINICAL IMPRESSION: Patient is a 81 y.o. male who was seen today for previous thalamic stroke and Parkinson's disease. 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 Conducted education and training of Speak Out! Program to optimize vocal intensity and clarity. Skilled ST is warranted to address dysarthria and cognition to maximize communication effectiveness and functional independence.    OBJECTIVE IMPAIRMENTS  Objective impairments include attention, memory, and dysarthria. These impairments are limiting patient from household responsibilities and effectively communicating at home and  in community.Factors affecting potential to achieve goals and functional outcome are medical prognosis and previous level of function. Patient will benefit from skilled SLP services to address above impairments and improve overall function.   REHAB POTENTIAL: Good   PLAN: SLP FREQUENCY: 2x/week   SLP DURATION: 12 weeks   PLANNED INTERVENTIONS: Cueing hierachy, Cognitive reorganization, Internal/external aids, Functional tasks, Multimodal communication approach, SLP instruction and feedback, Compensatory strategies, and Patient/family education  Su Monks, CCC-SLP 05/14/2022, 1:58 PM

## 2022-05-14 NOTE — Patient Instructions (Signed)
Next session, we can sign you up for the Parkinson's webinar- August 8, 11:30... bring your email address with you to next sesssion.   Warm-Up:  May-me-my-moe-moo: hold out the vowel sound, connect the sounds together  Loud/Sustained "ah": Hold out as long as you can maintain your strong, powerful, intentional "ah"   Counting: Say the numbers connected together with a held out vowel sound. Numbers on a single line are done in one breath. You will breathe where it says STOP.   Reading: Pace yourself and power your voice with a nice, deep breath.   Cognitive Exercise: Increased effort from your normal speaking voice. Try STOP-THINK-then SPEAK  Carryover tasks: Ideas for you to practice your intentional speech. It can be beneficial to decide before doing something that in that particular task you will practice your strong speech.

## 2022-05-17 ENCOUNTER — Ambulatory Visit: Payer: Medicare (Managed Care) | Admitting: Occupational Therapy

## 2022-05-17 ENCOUNTER — Ambulatory Visit: Payer: Medicare (Managed Care)

## 2022-05-17 ENCOUNTER — Encounter: Payer: Self-pay | Admitting: Occupational Therapy

## 2022-05-17 DIAGNOSIS — R293 Abnormal posture: Secondary | ICD-10-CM

## 2022-05-17 DIAGNOSIS — R2681 Unsteadiness on feet: Secondary | ICD-10-CM

## 2022-05-17 DIAGNOSIS — R2689 Other abnormalities of gait and mobility: Secondary | ICD-10-CM

## 2022-05-17 DIAGNOSIS — R41840 Attention and concentration deficit: Secondary | ICD-10-CM

## 2022-05-17 DIAGNOSIS — R41841 Cognitive communication deficit: Secondary | ICD-10-CM

## 2022-05-17 DIAGNOSIS — R251 Tremor, unspecified: Secondary | ICD-10-CM

## 2022-05-17 DIAGNOSIS — M6281 Muscle weakness (generalized): Secondary | ICD-10-CM

## 2022-05-17 DIAGNOSIS — R41844 Frontal lobe and executive function deficit: Secondary | ICD-10-CM

## 2022-05-17 DIAGNOSIS — R471 Dysarthria and anarthria: Secondary | ICD-10-CM

## 2022-05-17 DIAGNOSIS — R278 Other lack of coordination: Secondary | ICD-10-CM

## 2022-05-17 NOTE — Therapy (Signed)
OUTPATIENT PHYSICAL THERAPY TREATMENT NOTE   Patient Name: Jordan [name] MRN: [id number] DOB:1941-09-10, 81 y.o., male Today's Date: 05/17/2022  PCP: [name], MD REFERRING PROVIDER: [name], MD  END OF SESSION:   PT End of Session - 05/17/22 1322     Visit Number 4    Number of Visits 17    Date for PT Re-Evaluation 07/21/22    Authorization Type [employer] Medicare    PT Start Time 1403    PT Stop Time 1445    PT Time Calculation (min) 42 min    Equipment Utilized During Treatment Gait belt    Activity Tolerance Patient tolerated treatment well    Behavior During Therapy WFL for tasks assessed/performed             Past Medical History:  Diagnosis Date   Arthritis    R shoulder, bone spur   Arthritis    "hands" (03/25/2016)   Chronic diastolic heart failure (HCC)    Chronic lower back pain    Coronary heart disease    Dr Pernell Dupre   H/O cardiovascular stress test    perhaps last one was 2009   Heart murmur    12/29/11 echo: mild MR, no AS, trivial TR   History of gout    Hyperlipidemia    Hypertension    saw Dr. Linard Millers last earl;y- 2014, cardiac cath. last done ?2009, blocks seen didn't require any intervention at that point.   Narcolepsy    [name] 04-11-97; Mean Latency 1.23mn, SOREM 2   OSA on CPAP    NPSG 12-13-98 AHI 22.7   PONV (postoperative nausea and vomiting)    Presence of permanent cardiac pacemaker    Shortness of breath    with exertion    Stroke ([place]    Type II diabetes mellitus ([place]    Past Surgical History:  Procedure Laterality Date   CARDIAC CATHETERIZATION  2009?   EP IMPLANTABLE DEVICE N/A 03/25/2016   Procedure: Pacemaker Implant - Dual Chamber;  Surgeon: GEvans Lance MD;  [facility];  Service: Cardiovascular;  Laterality: N/A;   FRACTURE SURGERY     INSERT / REPLACE / REMOVE PACEMAKER  03/24/2016   INTRAMEDULLARY (IM) NAIL INTERTROCHANTERIC Left 02/08/2022   Procedure: INTRAMEDULLARY (IM) NAIL  INTERTROCHANTRIC;  Surgeon: DMeredith Pel MD;  [facility]  Service: Orthopedics;  Laterality: Left;   IR KYPHO LUMBAR INC FX REDUCE BONE BX UNI/BIL CANNULATION INC/IMAGING  12/29/2021   IR RADIOLOGIST EVAL & MGMT  12/15/2021   LEFT AND RIGHT HEART CATHETERIZATION WITH CORONARY ANGIOGRAM N/A 06/05/2014   Procedure: LEFT AND RIGHT HEART CATHETERIZATION WITH CORONARY ANGIOGRAM;  Surgeon: HSinclair Grooms MD;  [facility];  Service: Cardiovascular;  Laterality: N/A;   NASAL FRACTURE SURGERY     SHOULDER ARTHROSCOPY WITH ROTATOR CUFF REPAIR AND SUBACROMIAL DECOMPRESSION Right 08/16/2013   Procedure: SHOULDER ARTHROSCOPY WITH ROTATOR CUFF REPAIR AND SUBACROMIAL DECOMPRESSION;  Surgeon: JNita Sells MD;  [facility]  Service: Orthopedics;  Laterality: Right;  Right shoulder arthroscopy rotator cuff repair, subacromial decompression.   TONSILLECTOMY     Patient Active Problem List   Diagnosis Date Noted   Acute deep vein thrombosis (DVT) of left lower extremity ([place] 04/29/2022   Closed intertrochanteric fracture of left hip, initial encounter ([place]    Closed left hip fracture ([place] secondary to fall at home 02/08/2022   Paroxysmal atrial fibrillation ([place] 02/08/2022   Debility 01/08/2022  Constipation 01/07/2022   Physical deconditioning 82/80/0349   Acute metabolic encephalopathy 17/91/5056   Hypomagnesemia 01/04/2022   Pancreatitis 12/30/2021   Parkinson's disease ([place]) 12/21/2021   Parkinson disease ([place]) 12/18/2021   Frequent falls 12/18/2021   History of CVA (cerebrovascular accident) 12/18/2021   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   Hallucinations, visual 07/30/2021   Acetabulum fracture, left ([place]) 06/25/2021   Acute hip pain, left 06/24/2021   Fall at home, initial encounter 06/24/2021   Leukocytosis 06/24/2021   Chronic diastolic heart failure ([place])    Sinusitis, acute maxillary 06/24/2020   AKI (acute kidney injury) ([place])    Labile blood  glucose    Cough    Benign essential HTN    Skin lesion of hand    Hypoalbuminemia due to protein-calorie malnutrition ([place])    Controlled type 2 diabetes mellitus with hyperglycemia, without long-term current use of insulin (HCC)    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   Morbid obesity ([place])    Acute blood loss anemia    Acute thalamic infarction ([place]) 04/15/2020   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[place]) 05/25/2019   TIA (transient ischemic attack) 02/17/2018   Type 2 diabetes mellitus with hyperlipidemia ([place]) 02/17/2018   Insomnia 02/01/2018   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   Chronotropic incompetence 07/09/2014   Dyspnea on exertion 03/03/2014   HLD (hyperlipidemia) 12/13/2007   Essential hypertension 12/13/2007   Class 1 obesity 12/05/2007   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   Coronary atherosclerosis 12/05/2007    REFERRING DIAG: I63.81 (ICD-10-CM) - 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[place]) G20 (ICD-10-CM) - Parkinson's disease ([place]) S72.145S (ICD-10-CM) - Type III open nondisplaced intertrochanteric fracture of left femur, sequela    THERAPY DIAG:  Abnormal posture  Unsteadiness on feet  Muscle weakness (generalized)  Other abnormalities of gait and mobility  Rationale for Evaluation and Treatment Rehabilitation  PERTINENT HISTORY: PMHx: PD (diagnosed Jan 2022), HLD, OSA, DM, HTN, chronic diastolic HF, CVA, frequent falls, L2 compression fx, L hip fx   PRECAUTIONS: Fall and ICD/Pacemaker, history of compression fracture at L2 with recent kyphoplasty   SUBJECTIVE: Patient reports doing well. He states that in the past few days he feels as though he's been making progress. He reports being able to ambulate to his couch with minimal to no pain. He does report that he feels as though his legs could give way while walking from weakness, but they don't.  PAIN:  Are you having pain? No   TODAY'S TREATMENT:  Therex: -review of HEP (see below)   -scifit x5 mins level 1  Theract:   [facility] PT Assessment - 05/17/22 0001       Standardized Balance Assessment   Standardized Balance Assessment 10 meter walk test    Five times sit to stand comments  21s    10 Meter Walk .60f/s                PATIENT EDUCATION: Education details: OM results, HEP Person educated: Patient and Spouse Education method: Explanation Education comprehension: verbalized understanding     HOME EXERCISE PROGRAM: Access Code: [email or web address]: [email or web address] Date: 04/23/2022 Prepared by: JEstevan Ryder Exercises - Clamshell  - 1 x daily - 7 x weekly - 3 sets - 10 reps - Seated Scapular Retraction  - 1 x daily - 7 x weekly - 3 sets - 10 reps - Seated Long Arc Quad  - 1 x daily - 7 x weekly - 3 sets - 10 reps - Seated Hamstring Stretch  -  1 x daily - 7 x weekly - 3 sets - 10 reps - Sit to Stand  - 1 x daily - 7 x weekly - 3 sets - 10 reps - Seated Heel Raise  - 1 x daily - 7 x weekly - 3 sets - 10 reps - Seated March  - 1 x daily - 7 x weekly - 3 sets - 10 reps - Seated Flexion Stretch with Swiss Ball  - 1 x daily - 7 x weekly - 3 sets - 10 reps          GOALS: Goals reviewed with patient? Yes   SHORT TERM GOALS: Target date: 05/19/2022   Pt will be able to perform initial HEP for strengthening and balance to continue gains with supervision of wife. Baseline: Goal status: MET   2.  BERG to be assessed with STG/LTG written.  Baseline: 20/56 Goal status: MET   3.  Pt will improve 5x sit<>stand to less than or equal to 19 sec without UE support to demonstrate improved functional strength and transfer efficiency.   Baseline: 22.6 seconds without UE support from chair; 21seconds without UE support  Goal status: NOT MET   4.  Pt will ambulate >350' with RW mod I for improved household and short community distances. Baseline: requires CGA/MinA/ limited to 230' Goal status: NOT MET   5.  Pt will improve gait speed with  RW to at least 1.3 ft/sec in order to demo improved community mobility.    Baseline:  .87 ft/sec with RW; .60f/s (0.344m) Goal status: NOT MET       LONG TERM GOALS: Target date: 06/16/2022     Pt will be independent with final HEP for strength,gait, balance in order to build upon functional gains made in therapy.   Baseline:  Goal status: INITIAL   2.  Patient will score >25/56 on Berg to demonstrate improved balance Baseline: 20/56 Goal status: REVISED   3.  Pt will improve TUG time with RW vs. LRAD to 26 seconds or less in order to demo decrease fall risk.   Baseline: 33.32 seconds with RW  Goal status: INITIAL   4.  Pt will improve gait speed with RW vs. LRAD to at least 1.9 ft/sec in order to demo improved community mobility.  Baseline: .87 ft/sec with RW Goal status: INITIAL   5.  Pt will ambulate >350' with RW versus LRAD on paved surfaces with supervision for improved community mobility. Baseline: 230' Goal status: REVISED     ASSESSMENT:   CLINICAL IMPRESSION: Patient seen for skilled PT with emphasis on goal assessment and functional strengthening. PT providing patient with print out of HEP again- reviewing exercises. 10 Meter Walk Test: Patient instructed to walk 10 meters (32.8 ft) as quickly and as safely as possible at their normal speed x2 and at a fast speed x2. Time measured from 2 meter mark to 8 meter mark to accommodate ramp-up and ramp-down.  Normal speed: 0.73m44m Cut off scores: <0.4 m/s = household Ambulator, 0.4-0.8 m/s = limited community Ambulator, >0.8 m/s = community Ambulator, >1.2 m/s = crossing a street, <1.0 = increased fall risk MCID 0.05 m/s (small), 0.13 m/s (moderate), 0.06 m/s (significant)  (ANPTA Core Set of Outcome Measures for Adults with Neurologic Conditions, 2018). Five times Sit to Stand Test (FTSS) Method: Use a straight back chair with a solid seat that is 17-18" high. Ask participant to sit on the chair with arms folded across  their chest.  Instructions: "Stand up and sit down as quickly as possible 5 times, keeping your arms folded across your chest."   Measurement: Stop timing when the participant touches the chair in sitting the 5th time.  TIME: 21 sec  Cut off scores indicative of increased fall risk: >12 sec CVA, >16 sec PD, >13 sec vestibular (ANPTA Core Set of Outcome Measures for Adults with Neurologic Conditions, 2018). Continue POC.     OBJECTIVE IMPAIRMENTS Abnormal gait, decreased activity tolerance, decreased balance, decreased coordination, decreased endurance, decreased knowledge of use of DME, decreased mobility, decreased ROM, decreased strength, impaired flexibility, postural dysfunction, and pain.    ACTIVITY LIMITATIONS cleaning, community activity, driving, meal prep, laundry, yard work, and shopping.    PERSONAL FACTORS Age, Past/current experiences, Time since onset of injury/illness/exacerbation, and 3+ comorbidities:  PD, HLD, OSA, DM, HTN, chronic diastolic HF, CVA, frequent falls, L2 compression fx, L hip fx   are also affecting patient's functional outcome.      REHAB POTENTIAL: Good   CLINICAL DECISION MAKING: Evolving/moderate complexity   EVALUATION COMPLEXITY: Moderate   PLAN: PT FREQUENCY: 2x/week   PT DURATION: 12 weeks, plus eval   PLANNED INTERVENTIONS: Therapeutic exercises, Therapeutic activity, Neuromuscular re-education, Balance training, Gait training, Patient/Family education, Joint mobilization, Stair training, Vestibular training, Canalith repositioning, DME instructions, and Manual therapy   PLAN FOR NEXT SESSION: posture, functional strength/endurance, add balance to HEP, gait training, weight shifting to L  Debbora Dus, PT Debbora Dus, PT, DPT, CBIS  05/17/2022, 2:53 PM

## 2022-05-17 NOTE — Patient Instructions (Addendum)
Remember the focus of our program is to be LOUD first and foremost  Tuesday= Lesson 7 [employer] 8  Thursday= Lesson 9   Ideally, do each lesson twice a day

## 2022-05-17 NOTE — Therapy (Signed)
OUTPATIENT SPEECH LANGUAGE PATHOLOGY TREATMENT NOTE   Patient Name: Jordan [name] MRN: [id number] DOB:18-Dec-1940, 81 y.o., male Today's Date: 05/17/2022  PCP: [name] MD REFERRING PROVIDER: Izora Ribas, MD   END OF SESSION:   End of Session - 05/17/22 1416     Visit Number 5    Number of Visits 25    Date for SLP Re-Evaluation 07/16/22    Authorization Type [employer]  $20 co pay per day    SLP Start Time 1445    SLP Stop Time  1530    SLP Time Calculation (min) 45 min    Activity Tolerance Patient tolerated treatment well               Past Medical History:  Diagnosis Date   Arthritis    R shoulder, bone spur   Arthritis    "hands" (03/25/2016)   Chronic diastolic heart failure (HCC)    Chronic lower back pain    Coronary heart disease    Dr Pernell Dupre   H/O cardiovascular stress test    perhaps last one was 2009   Heart murmur    12/29/11 echo: mild MR, no AS, trivial TR   History of gout    Hyperlipidemia    Hypertension    saw Dr. Linard Millers last earl;y- 2014, cardiac cath. last done ?2009, blocks seen didn't require any intervention at that point.   Narcolepsy    [name] 04-11-97; Mean Latency 1.38mn, SOREM 2   OSA on CPAP    NPSG 12-13-98 AHI 22.7   PONV (postoperative nausea and vomiting)    Presence of permanent cardiac pacemaker    Shortness of breath    with exertion    Stroke ([place]    Type II diabetes mellitus ([place]    Past Surgical History:  Procedure Laterality Date   CARDIAC CATHETERIZATION  2009?   EP IMPLANTABLE DEVICE N/A 03/25/2016   Procedure: Pacemaker Implant - Dual Chamber;  Surgeon: GEvans Lance MD;  [facility];  Service: Cardiovascular;  Laterality: N/A;   FRACTURE SURGERY     INSERT / REPLACE / REMOVE PACEMAKER  03/24/2016   INTRAMEDULLARY (IM) NAIL INTERTROCHANTERIC Left 02/08/2022   Procedure: INTRAMEDULLARY (IM) NAIL INTERTROCHANTRIC;  Surgeon: DMeredith Pel MD;  [facility]   Service: Orthopedics;  Laterality: Left;   IR KYPHO LUMBAR INC FX REDUCE BONE BX UNI/BIL CANNULATION INC/IMAGING  12/29/2021   IR RADIOLOGIST EVAL & MGMT  12/15/2021   LEFT AND RIGHT HEART CATHETERIZATION WITH CORONARY ANGIOGRAM N/A 06/05/2014   Procedure: LEFT AND RIGHT HEART CATHETERIZATION WITH CORONARY ANGIOGRAM;  Surgeon: HSinclair Grooms MD;  [facility];  Service: Cardiovascular;  Laterality: N/A;   NASAL FRACTURE SURGERY     SHOULDER ARTHROSCOPY WITH ROTATOR CUFF REPAIR AND SUBACROMIAL DECOMPRESSION Right 08/16/2013   Procedure: SHOULDER ARTHROSCOPY WITH ROTATOR CUFF REPAIR AND SUBACROMIAL DECOMPRESSION;  Surgeon: JNita Sells MD;  [place] [facility]  Service: Orthopedics;  Laterality: Right;  Right shoulder arthroscopy rotator cuff repair, subacromial decompression.   TONSILLECTOMY     Patient Active Problem List   Diagnosis Date Noted   Acute deep vein thrombosis (DVT) of left lower extremity ([place] 04/29/2022   Closed intertrochanteric fracture of left hip, initial encounter ([facility]    Closed left hip fracture ([place] secondary to fall at home 02/08/2022   Paroxysmal atrial fibrillation ([place] 02/08/2022   Debility 01/08/2022   Constipation 01/07/2022   Physical deconditioning  38/25/0539   Acute metabolic encephalopathy 76/73/4193   Hypomagnesemia 01/04/2022   Pancreatitis 12/30/2021   Parkinson's disease ([place]) 12/21/2021   Parkinson disease ([place]) 12/18/2021   Frequent falls 12/18/2021   History of CVA (cerebrovascular accident) 12/18/2021   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   Hallucinations, visual 07/30/2021   Acetabulum fracture, left ([place]) 06/25/2021   Acute hip pain, left 06/24/2021   Fall at home, initial encounter 06/24/2021   Leukocytosis 06/24/2021   Chronic diastolic heart failure ([place])    Sinusitis, acute maxillary 06/24/2020   AKI (acute kidney injury) ([place])    Labile blood glucose    Cough    Benign essential HTN    Skin lesion of hand     Hypoalbuminemia due to protein-calorie malnutrition ([place])    Controlled type 2 diabetes mellitus with hyperglycemia, without long-term current use of insulin (HCC)    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   Morbid obesity ([place])    Acute blood loss anemia    Acute thalamic infarction ([place]) 04/15/2020   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[place]) 05/25/2019   TIA (transient ischemic attack) 02/17/2018   Type 2 diabetes mellitus with hyperlipidemia ([place]) 02/17/2018   Insomnia 02/01/2018   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   Chronotropic incompetence 07/09/2014   Dyspnea on exertion 03/03/2014   HLD (hyperlipidemia) 12/13/2007   Essential hypertension 12/13/2007   Class 1 obesity 12/05/2007   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 Exercises"   PAIN:  Are you having pain? No  OBJECTIVE:     TODAY'S TREATMENT:  05-17-22: Re-educated principles of intent due to reported reduced comprehension and recall of pertinence for increasing vocal intensity. Re-educated purpose of program to focus on loudness as pt noted to focus on irrelevant or minute details (ex: syllable count). Targeted improving vocal quality and increasing intensity through progressively difficulty speech tasks using Speak Out! program, lesson 6. ST leads pt through exercises providing occasional model prior to pt execution. Usual fading occasional min-A required to achieve target dB this date. Averages this date: loud "ah" 85 dB; reading 74 dB; cognitive speech task 72 dB. Conversational sample of approx 5  minutes, pt averaged lower 70s dB with occasional to usual in-A to utilize intentional speech.  05-14-22: Pt with mild sub optimal volume upon entering therapy session, mid 60 dB. Awareness exhibited of low volume with pt able to correct with min-A from SLP. Pt questions overall purpose of ST. Generated personal goal of being able to project voice during reenactments or sharing a presentation on a revolutionary war story. Pt with questions regarding program execution, SLP provides written instructions answering all questions, models exercises for pt, and provides verbal feedback. Following, SLP leads pt through exercises providing usual model prior to pt execution to aid comprehension and completion. Occasional min to mod-A required to achieve targeted dB this date. Averages this date: loud "ah" 86 dB; counting 82 dB; reading  dB; cognitive speech task 82 dB. Intermittent side comments required usual cues to optimize vocal intensity from mid 60s dB to low 70s dB.  05-10-22: Pt returned with Speak Out! Workbook. Endorsed his need for additional training due to reduced  recall since last ST session. Pt also inquired how to determine about appropriate loudness in conversation, in which SLP utilized audio feedback to highlight decay in vocal intensity and clarity short 2 minute speech sample. Pt demonstrated increased sensory awareness of decline in volume and clarity. Re-educated Speak Out! Program principles and rationale to optimize carryover of dysarthria compensations. SLP leads pt through exercises providing usual model prior to pt execution to aid comprehension and completion. Occasional min to mod-A required to achieve targeted dB this date. Averages this date: loud "ah" 88 dB (~5-8 seconds); reading 77 dB; cognitive speech task 75 dB. Intermittent side comments required usual cues to optimize vocal intensity from mid 60s dB to low 70s dB.  04-28-22: Re-educated rationale and principles of Speak Out! Program  for hypokinetic dysarthria. Targeted improving vocal quality and increasing intensity through progressively difficulty speech tasks using Speak Out! program, lesson 1. ST leads pt through exercises providing usual model prior to pt execution. Occasional min to mod-A required to achieve target dB this date. Averages this date: loud "ah" 89 dB (~8 seconds); reading 75 dB; cognitive speech task 75 dB. Intermittent side comments required usual cues to optimize vocal intensity from mid 60s dB to low 70s dB.    PATIENT EDUCATION: Education details: see above Person educated: Patient and Spouse Education method: Explanation, Demonstration, and Handouts Education comprehension: verbalized understanding, returned demonstration, and needs further education     HOME EXERCISE PROGRAM: Order Speak Out! book     GOALS: Goals reviewed with patient? Yes   SHORT TERM GOALS: Target date: 05/19/2022     Pt will complete Speak Out! HEP at least 1x/day given occasional min A over 2 sessions  Baseline: 05-17-22 Goal status: ongoing   2.  Pt will achieve targeted dB (85-90 dB) on warm up exercises with 80% accuracy given occasional min A over 2 sessions  Baseline:  Goal status: ongoing   3.  Pt will achieve targeted dB (75-85 dB) on reading exercises with 80% accuracy given occasional min A over 2 sessions Baseline: 05-10-22, 05-17-22 Goal status: Met   4.  Pt will achieve targeted dB (72-78 dB) on cognitive exercises with 80% accuracy given occasional min A over 2 sessions Baseline: 05-10-22, 7-11-07-21 Goal status: Met   5.  Pt will utilize dysarthria compensations in 5-10 minute conversation to optimize vocal intensity and clarity given occasional mod A over 2 sessions  Baseline:  Goal status: ongoing   6.  Pt will implement memory/attention compensations to aid daily functioning given occasional mod A over 2 sessions  Baseline:  Goal status: ongoing   LONG TERM GOALS: Target date: 07/16/2022    Pt  will complete Speak Out! HEP at least 1x/day (BID recommended) > 1 week  Baseline:  Goal status: ongoing   2.  Pt will achieve targeted dB levels in demonstration of Speak Out! Lessons with 90% accuracy given rare min A over 2 sessions  Baseline:  Goal status: ongoing   3.  Pt will utilize dysarthria compensations in 15+ minute conversation to optimize vocal intensity and clarity given occasional min A over 2 sessions Baseline:  Goal status: ongoing   4.  Pt will implement memory/attention compensations to aid daily functioning given occasional min A over 2 sessions  Baseline:  Goal status: ongoing   5.  Pt will report improved communication effectiveness via PROM by 2 point improvement by last ST session  Baseline: CPIB=26 Goal status: ongoing   ASSESSMENT:   CLINICAL IMPRESSION:  Patient is a 81 y.o. male who was seen today for previous thalamic stroke and Parkinson's disease. 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 Conducted education and training of Speak Out! Program to optimize vocal intensity and clarity. Skilled ST is warranted to address dysarthria and cognition to maximize communication effectiveness and functional independence.    OBJECTIVE IMPAIRMENTS  Objective impairments include attention, memory, and dysarthria. These impairments are limiting patient from household responsibilities and effectively communicating at home and in community.Factors affecting potential to achieve goals and functional outcome are medical prognosis and previous level of function. Patient will benefit from skilled SLP services to address above impairments and improve overall function.   REHAB POTENTIAL: Good   PLAN: SLP FREQUENCY: 2x/week   SLP DURATION: 12 weeks   PLANNED INTERVENTIONS: Cueing hierachy, Cognitive reorganization, Internal/external aids, Functional tasks, Multimodal communication approach, SLP instruction  and feedback, Compensatory strategies, and Patient/family education  Marzetta Board, CCC-SLP 05/17/2022, 5:23 PM

## 2022-05-17 NOTE — Therapy (Signed)
OUTPATIENT OCCUPATIONAL THERAPY [facility]  Patient Name: Jordan [name] MRN: [id number] DOB:[birth year], 81 y.o., male Today's Date: 05/17/2022  PCP: Dr. Kenton Kingfisher REFERRING PROVIDER: Dr. Ranell Patrick   OT End of Session - 05/17/22 1324     Visit Number 5    Number of Visits 24    Date for OT Re-Evaluation 07/15/22    Authorization Type [employer]    Authorization - Visit Number 5    Authorization - Number of Visits 10    Progress Note Due on Visit 10    OT Start Time 1321    OT Stop Time 1400    OT Time Calculation (min) 39 min    Activity Tolerance Patient tolerated treatment well    Behavior During Therapy WFL for tasks assessed/performed                Past Medical History:  Diagnosis Date   Arthritis    R shoulder, bone spur   Arthritis    "hands" (03/25/2016)   Chronic diastolic heart failure (HCC)    Chronic lower back pain    Coronary heart disease    Dr Pernell Dupre   H/O cardiovascular stress test    perhaps last one was 2009   Heart murmur    12/29/11 echo: mild MR, no AS, trivial TR   History of gout    Hyperlipidemia    Hypertension    saw Dr. Linard Millers last earl;y- 2014, cardiac cath. last done ?2009, blocks seen didn't require any intervention at that point.   Narcolepsy    [name] 04-11-97; Mean Latency 1.11mn, SOREM 2   OSA on CPAP    NPSG 12-13-98 AHI 22.7   PONV (postoperative nausea and vomiting)    Presence of permanent cardiac pacemaker    Shortness of breath    with exertion    Stroke ([facility]    Type II diabetes mellitus ([place]    Past Surgical History:  Procedure Laterality Date   CARDIAC CATHETERIZATION  2009?   EP IMPLANTABLE DEVICE N/A 03/25/2016   Procedure: Pacemaker Implant - Dual Chamber;  Surgeon: GEvans Lance MD;  [facility]: [facility];  Service: Cardiovascular;  Laterality: N/A;   FRACTURE SURGERY     INSERT / REPLACE / REMOVE PACEMAKER  03/24/2016   INTRAMEDULLARY (IM) NAIL INTERTROCHANTERIC Left 02/08/2022    Procedure: INTRAMEDULLARY (IM) NAIL INTERTROCHANTRIC;  Surgeon: DMeredith Pel MD;  [facility]  Service: Orthopedics;  Laterality: Left;   IR KYPHO LUMBAR INC FX REDUCE BONE BX UNI/BIL CANNULATION INC/IMAGING  12/29/2021   IR RADIOLOGIST EVAL & MGMT  12/15/2021   LEFT AND RIGHT HEART CATHETERIZATION WITH CORONARY ANGIOGRAM N/A 06/05/2014   Procedure: LEFT AND RIGHT HEART CATHETERIZATION WITH CORONARY ANGIOGRAM;  Surgeon: HSinclair Grooms MD;  [facility];  Service: Cardiovascular;  Laterality: N/A;   NASAL FRACTURE SURGERY     SHOULDER ARTHROSCOPY WITH ROTATOR CUFF REPAIR AND SUBACROMIAL DECOMPRESSION Right 08/16/2013   Procedure: SHOULDER ARTHROSCOPY WITH ROTATOR CUFF REPAIR AND SUBACROMIAL DECOMPRESSION;  Surgeon: JNita Sells MD;  [facility]  Service: Orthopedics;  Laterality: Right;  Right shoulder arthroscopy rotator cuff repair, subacromial decompression.   TONSILLECTOMY     Patient Active Problem List   Diagnosis Date Noted   Acute deep vein thrombosis (DVT) of left lower extremity ([place] 04/29/2022   Closed intertrochanteric fracture of left hip, initial encounter ([place]    Closed left hip fracture ([place] secondary to fall at  home 02/08/2022   Paroxysmal atrial fibrillation ([place]) 02/08/2022   Debility 01/08/2022   Constipation 01/07/2022   Physical deconditioning 62/26/3335   Acute metabolic encephalopathy 45/62/5638   Hypomagnesemia 01/04/2022   Pancreatitis 12/30/2021   Parkinson's disease ([place]) 12/21/2021   Parkinson disease ([place]) 12/18/2021   Frequent falls 12/18/2021   History of CVA (cerebrovascular accident) 12/18/2021   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   Hallucinations, visual 07/30/2021   Acetabulum fracture, left ([place]) 06/25/2021   Acute hip pain, left 06/24/2021   Fall at home, initial encounter 06/24/2021   Leukocytosis 06/24/2021   Chronic diastolic heart failure ([place])    Sinusitis, acute maxillary 06/24/2020   AKI (acute  kidney injury) ([place])    Labile blood glucose    Cough    Benign essential HTN    Skin lesion of hand    Hypoalbuminemia due to protein-calorie malnutrition ([place])    Controlled type 2 diabetes mellitus with hyperglycemia, without long-term current use of insulin (HCC)    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   Morbid obesity ([place])    Acute blood loss anemia    Acute thalamic infarction ([place]) 04/15/2020   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[place]) 05/25/2019   TIA (transient ischemic attack) 02/17/2018   Type 2 diabetes mellitus with hyperlipidemia ([place]) 02/17/2018   Insomnia 02/01/2018   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   Chronotropic incompetence 07/09/2014   Dyspnea on exertion 03/03/2014   HLD (hyperlipidemia) 12/13/2007   Essential hypertension 12/13/2007   Class 1 obesity 12/05/2007   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  Pt reports that pain has been better the last 2 days, but still weak.  Pt accompanied by:  self   PERTINENT HISTORY: Jordan [name]. [name] is an 81 year old male with history of CAD/chronic diastolic CHF, OSA, L3TD, gout, Parkinson's diease with recent hospital admission 02/17-02-22-23 worsening of gait disorder with falls with hallucinations which was treated with medication adjustment and he was discharged to home. He was readmitted on 12/30/21 with abdominal pain with low grade fever, leucocytosis, lactic acidosis, mild  confusion and lethargy secondary to sepsis. He was found to have elevated lipase-165  with inflammatory changes suggestive of primary pancreatitis with secondary inflammation of hepatic flexure of colon or primary sigmoid diverticulitis with secondary extension to pancreatic tail. He was treated with IVF and broad spectrum antibiotics.   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 Pt fell and sustained hip fx on 02/08/22 and pt underwent left  IM nailing.  04/30/22:  hospitalized with blood clot LLE--changed meds and cleared to resume OT/PT.   Pt accompanied by: self and significant other, spouse, [name]    PRECAUTIONS: Fall and Other: , hx of back kyphoplasty  , pacemaker, hx of L hip fx, s/p IM nailing, hx of shoulder dislocations, hx of LLE DVT  WEIGHT BEARING RESTRICTIONS No  PAIN:  Are you having pain? No  PLOF: Independent prior  to CVA in 2021  PATIENT GOALS to be more independent, pt's wife states he would like to be able to drive  OBJECTIVE:   TODAY'S TREATMENT:   Discussed progress with donning shirt and cutting food.  Practiced using sock aide to don L sock.  Pt able to doff shoe and sock mod I (shown how to use reacher end knob to help prn).  Pt able to use sock aide with min cueing (pt reports that he thinks that he has one at home.  Practiced simulated LB dressing to don/doff theraband loop over feet using adaptive strategies/reacher.  Pt returned demo with min cueing.  Pt reports that he thinks he has a Secondary school teacher at home.    Practiced writing.  Pt wrote 2 sentences with approx 95% legibility.  Pt with decr legibility with decr letter spacing (no significant decr in size).  Then practiced continuous cursive "l" with mod difficulty with spacing.      PATIENT EDUCATION: Education details: strategies for donning sock with sock aide, donning pants with reacher, and writing Person educated:  Patient Education method: Explanation, demonstration, verbal cues, handout Education comprehension: verbalized understanding, returned demonstration, verbal cues   HOME EXERCISE PROGRAM: Coordination 05/10/22:  cane HEP (shoulder flex, chest press, and shoulder abduction); strategies for cutting food and donning shirt 05/17/22:  strategies for donning sock with sock aide, donning pants with reacher, and writing    GOALS: Goals reviewed with patient? No  SHORT TERM GOALS: Target date: 05/14/22--extend to 05/27/22 due to missed visits  I with initial HEP Baseline: Goal status: IN PROGRESS  2.  Pt will verbalize understanding of adapted strategies to maximize safety and I with ADLs/ IADLs (YB:OFBPZWC food) Baseline:  Goal status: IN PROGRESS  3.  Pt will demonstrate improved UE functional use as evidenced by increasing LUE box/ blocks score by 3 blocks. Baseline: RUE 45, LUE 37 Goal status: inital  4.  Pt will perform LB dressing with min A demonstrating good safety awareness. Baseline: mod-max A Goal status: IN PROGRESS  5.  Pt will consistently donn pullover shirt mod I in a reasonable amount of time Baseline: increased time required and prn min assist Goal status: IN PROGRESS  6.   Pt will verbalize understanding of ways to prevent future PD related complications and PD community resources. Goal status: INITIAL  7.. Pt will retrieve items from overhead shelf at 95* with right UE with -15 elbow extension Baseline: 95, -20 Goal status: INITIAL  8. Pt will retrieve items from overhead shelf at 110 with right UE with -10 elbow extension Baseline: 110, -15 Goal status: INITIAL  LONG TERM GOALS: Target date: 07/15/2022    Pt will demonstrate understanding of memory compensations and ways to keep thinking skills sharp  Goal status: INITIAL  2.  Pt will demonstrate improved fine motor coordination for ADLs as evidenced by decreasing 9 hole peg test score for bilateral UE's by  3 secs Baseline: RUE 42.47, LUE 42.25 Goal status: INITIAL  3.  Pt will demonstrate improved ease with feeding as evidenced by decreasing PPT#2 (self feeding) by 3 secs Baseline: 16.66 secs Goal status: INITIAL  4.  Pt will demonstrate improved ease with fastening buttons as evidenced by decreasing 3 button/ unbutton time to : 95 secs Baseline: 1 min 45 secs Goal status: INITIAL  5.  Pt will write a short paragraph with 100% legibility and no significant decrease in letter size Baseline: mild micrographia Goal status: INITIAL  6.  Assess test  PPT#4 and set goal prn Goal status: INITIAL   ASSESSMENT:  CLINICAL IMPRESSION: Pt is progressing towards goals.  Pt able to return demo use of sock aide to don sock with min cueing and returned demo use of reacher for simulated LB dressing.[name]  PERFORMANCE DEFICITS in functional skills including ADLs, IADLs, coordination, dexterity, sensation, ROM, strength, pain, flexibility, FMC, GMC, mobility, balance, endurance, decreased knowledge of precautions, decreased knowledge of use of DME, UE functional use, and bradykinesia, cognitive skills including attention, learn, memory, problem solving, safety awareness, and thought, and psychosocial skills including coping strategies, environmental adaptation, habits, interpersonal interactions, and routines and behaviors.   IMPAIRMENTS are limiting patient from ADLs, IADLs, rest and sleep, play, leisure, and social participation.   COMORBIDITIES may have co-morbidities  that affects occupational performance. Patient will benefit from skilled OT to address above impairments and improve overall function.  MODIFICATION OR ASSISTANCE TO COMPLETE EVALUATION: Min-Moderate modification of tasks or assist with assess necessary to complete an evaluation.  OT OCCUPATIONAL PROFILE AND HISTORY: Detailed assessment: Review of records and additional review of physical, cognitive, psychosocial history related to current  functional performance.  CLINICAL DECISION MAKING: Moderate - several treatment options, min-mod task modification necessary  REHAB POTENTIAL: Good  EVALUATION COMPLEXITY: Moderate       PLAN: OT FREQUENCY: 2x/week  OT DURATION: 12 weeks plus eval  PLANNED INTERVENTIONS: self care/ADL training, therapeutic exercise, therapeutic activity, neuromuscular re-education, manual therapy, passive range of motion, balance training, functional mobility training, aquatic therapy, ultrasound, paraffin, fluidotherapy, moist heat, cryotherapy, contrast bath, patient/family education, cognitive remediation/compensation, visual/perceptual remediation/compensation, energy conservation, coping strategies training, and DME and/or AE instructions  RECOMMENDED OTHER SERVICES: n/a  CONSULTED AND AGREED WITH PLAN OF CARE: Patient and family member/caregiver  PLAN FOR NEXT SESSION:   ADL/ big movement strategies, coordination, functional reach, assess PPT #4 and establish goal   [facility], OTR/L 05/17/2022, 1:24 PM  [phone] Phone: [phone] 1:24 PM 05/17/22

## 2022-05-17 NOTE — Patient Instructions (Addendum)
    Try using a sock aide to put on your socks.--worked well in therapy.  Try using a reacher to put on pants--grab waistband.  Put LEFT leg in first when putting pants on, then take pants off by taking RIGHT leg first.    Practice writing with focus on keeping letters spaced apart.  Write cursive "l" with focus on spacing.  Practice writing on lined paper.

## 2022-05-19 NOTE — Therapy (Signed)
OUTPATIENT OCCUPATIONAL THERAPY [facility]  Patient Name: Jordan [name] MRN: [id number] DOB:08-11-1941, 81 y.o., male Today's Date: 05/20/2022  PCP: Dr. Kenton [name] REFERRING PROVIDER: Dr. Ranell [name]   OT End of Session - 05/20/22 1318     Visit Number 6    Number of Visits 24    Date for OT Re-Evaluation 07/15/22    Authorization Type [employer]    Authorization - Visit Number 6    Authorization - Number of Visits 10    Progress Note Due on Visit 10    OT Start Time 1318    OT Stop Time 1400    OT Time Calculation (min) 42 min    Activity Tolerance Patient tolerated treatment well    Behavior During Therapy WFL for tasks assessed/performed                 Past Medical History:  Diagnosis Date   Arthritis    R shoulder, bone spur   Arthritis    "hands" (03/25/2016)   Chronic diastolic heart failure (HCC)    Chronic lower back pain    Coronary heart disease    Dr Jordan [name]   H/O cardiovascular stress test    perhaps last one was 2009   Heart murmur    12/29/11 echo: mild MR, no AS, trivial TR   History of gout    Hyperlipidemia    Hypertension    saw Dr. Linard [name] last earl;y- 2014, cardiac cath. last done ?2009, blocks seen didn't require any intervention at that point.   Narcolepsy    [name] 04-11-97; Mean Latency 1.22mn, SOREM 2   OSA on CPAP    NPSG 12-13-98 AHI 22.7   PONV (postoperative nausea and vomiting)    Presence of permanent cardiac pacemaker    Shortness of breath    with exertion    Stroke ([facility] [place]    Type II diabetes mellitus ([facility]    Past Surgical History:  Procedure Laterality Date   CARDIAC CATHETERIZATION  2009?   EP IMPLANTABLE DEVICE N/A 03/25/2016   Procedure: Pacemaker Implant - Dual Chamber;  Surgeon: GEvans Lance [name];  [facility];  Service: Cardiovascular;  Laterality: N/A;   FRACTURE SURGERY     INSERT / REPLACE / REMOVE PACEMAKER  03/24/2016   INTRAMEDULLARY (IM) NAIL INTERTROCHANTERIC Left 02/08/2022    Procedure: INTRAMEDULLARY (IM) NAIL INTERTROCHANTRIC;  Surgeon: DMeredith Pel [name];  [facility]  Service: Orthopedics;  Laterality: Left;   IR KYPHO LUMBAR INC FX REDUCE BONE BX UNI/BIL CANNULATION INC/IMAGING  12/29/2021   IR RADIOLOGIST EVAL & MGMT  12/15/2021   LEFT AND RIGHT HEART CATHETERIZATION WITH CORONARY ANGIOGRAM N/A 06/05/2014   Procedure: LEFT AND RIGHT HEART CATHETERIZATION WITH CORONARY ANGIOGRAM;  Surgeon: HSinclair Grooms [name];  [facility];  Service: Cardiovascular;  Laterality: N/A;   NASAL FRACTURE SURGERY     SHOULDER ARTHROSCOPY WITH ROTATOR CUFF REPAIR AND SUBACROMIAL DECOMPRESSION Right 08/16/2013   Procedure: SHOULDER ARTHROSCOPY WITH ROTATOR CUFF REPAIR AND SUBACROMIAL DECOMPRESSION;  Surgeon: JNita Sells [name];  [facility]: [facility]  Service: Orthopedics;  Laterality: Right;  Right shoulder arthroscopy rotator cuff repair, subacromial decompression.   TONSILLECTOMY     Patient Active Problem List   Diagnosis Date Noted   Acute deep vein thrombosis (DVT) of left lower extremity ([facility] 04/29/2022   Closed intertrochanteric fracture of left hip, initial encounter ([facility]    Closed left hip fracture ([facility] secondary to fall  at home 02/08/2022   Paroxysmal atrial fibrillation ([place]) 02/08/2022   Debility 01/08/2022   Constipation 01/07/2022   Physical deconditioning 16/08/9603   Acute metabolic encephalopathy 54/07/8118   Hypomagnesemia 01/04/2022   Pancreatitis 12/30/2021   Parkinson's disease ([place]) 12/21/2021   Parkinson disease ([place]) 12/18/2021   Frequent falls 12/18/2021   History of CVA (cerebrovascular accident) 12/18/2021   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   Hallucinations, visual 07/30/2021   Acetabulum fracture, left ([place]) 06/25/2021   Acute hip pain, left 06/24/2021   Fall at home, initial encounter 06/24/2021   Leukocytosis 06/24/2021   Chronic diastolic heart failure (HCC)    Sinusitis, acute maxillary 06/24/2020   AKI (acute  kidney injury) ([place])    Labile blood glucose    Cough    Benign essential HTN    Skin lesion of hand    Hypoalbuminemia due to protein-calorie malnutrition ([place])    Controlled type 2 diabetes mellitus with hyperglycemia, without long-term current use of insulin (HCC)    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   Morbid obesity ([place])    Acute blood loss anemia    Acute thalamic infarction ([place]) 04/15/2020   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[place]) 05/25/2019   TIA (transient ischemic attack) 02/17/2018   Type 2 diabetes mellitus with hyperlipidemia ([place]) 02/17/2018   Insomnia 02/01/2018   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   Chronotropic incompetence 07/09/2014   Dyspnea on exertion 03/03/2014   HLD (hyperlipidemia) 12/13/2007   Essential hypertension 12/13/2007   Class 1 obesity 12/05/2007   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   Coronary atherosclerosis 12/05/2007    ONSET DATE: 04/19/22  REFERRING DIAG: thalamic CVA I63.81  THERAPY DIAG:  Other lack of coordination  Attention and concentration deficit  Frontal lobe and executive function deficit  Abnormal posture  Unsteadiness on feet  Rationale for Evaluation and Treatment Rehabilitation  SUBJECTIVE:   SUBJECTIVE STATEMENT: Pt reports some central vision loss this am (lasting no more than 21mn).  Wife reports that pt is more consistent with donning shirt, but does not use adaptive/large movement strategies.    Pt accompanied by:  self, wife   PERTINENT HISTORY: [name] an 81year old male with history of CAD/chronic diastolic CHF, OSA, TJ4NW gout, Parkinson's diease with recent hospital admission 02/17-02-22-23 worsening of gait disorder with falls with hallucinations which was treated with medication adjustment and he was discharged to home. He was readmitted on 12/30/21 with abdominal pain with low grade fever, leucocytosis, lactic acidosis, mild confusion and lethargy  secondary to sepsis. He was found to have elevated lipase-165  with inflammatory changes suggestive of primary pancreatitis with secondary inflammation of hepatic flexure of colon or primary sigmoid diverticulitis with secondary extension to pancreatic tail. He was treated with IVF and broad spectrum antibiotics.   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 Pt fell and sustained hip fx on 02/08/22 and pt underwent left  IM nailing.  04/30/22:  hospitalized with blood clot LLE--changed meds and cleared to resume OT/PT.   Pt accompanied by: self and significant other, spouse, Jordan [name]   PRECAUTIONS: Fall and Other: , hx of back kyphoplasty  , pacemaker, hx of L hip fx, s/p IM nailing, hx of shoulder dislocations, hx of LLE DVT  WEIGHT BEARING RESTRICTIONS No  PAIN:  Are you having pain? No  PLOF: Independent prior to CVA in 2021  PATIENT  GOALS to be more independent, pt's wife states he would like to be able to drive  OBJECTIVE:   TODAY'S TREATMENT:   Pt reports some central vision loss this am (lasting no more than 35mn).  Pt unsure if he reported this to wife, therefore, asked wife to come back with pt to discuss (wife was in lobby).  Wife reports hx of occular migraines, but pt reports/describes incident this morning as different.  Checked pt's BP 101/56 RUE and 105/64 LUE (approx 524m later).  Reviewed CVA/TIA warning signs/symptoms and pt denies current symptoms and reports no other symptoms this morning.  Emphasized importance of informing wife of symptoms so that she can monitor symptoms and BP closely due to pt's medical hx and cognitive deficits.  Recommended pt/wife keep log of symptoms and BP as well to discuss with [name] and in case pt needs emergency medical care.  Flipping cards with each hand with min-mod cueing for large amplitude movements (supination, full finger ext) and discussed rationale and how these  movements help functional tasks.  Dealing cards with each thumb with min cueing for large amplitude movements.     PATIENT EDUCATION: Education details: Rationale for large amplitude hand/wrist movements to help with functional tasks and decr risk of future complications.  Person educated: Patient Education method: Explanation, demonstration, verbal cues, handout Education comprehension: verbalized understanding, returned demonstration, verbal cues   HOME EXERCISE PROGRAM: Coordination 05/10/22:  cane HEP (shoulder flex, chest press, and shoulder abduction); strategies for cutting food and donning shirt 05/17/22:  strategies for donning sock with sock aide, donning pants with reacher, and writing    GOALS: Goals reviewed with patient? No  SHORT TERM GOALS: Target date: 05/14/22--extend to 05/27/22 due to missed visits  I with initial HEP Baseline: Goal status: IN PROGRESS  2.  Pt will verbalize understanding of adapted strategies to maximize safety and I with ADLs/ IADLs ([id number]) Baseline:  Goal status: IN PROGRESS  3.  Pt will demonstrate improved UE functional use as evidenced by increasing LUE box/ blocks score by 3 blocks. Baseline: RUE 45, LUE 37 Goal status: inital  4.  Pt will perform LB dressing with min A demonstrating good safety awareness. Baseline: mod-max A Goal status: IN PROGRESS  5.  Pt will consistently donn pullover shirt mod I in a reasonable amount of time Baseline: increased time required and prn min assist Goal status: IN PROGRESS  6.   Pt will verbalize understanding of ways to prevent future PD related complications and PD community resources. Goal status: INITIAL  7. Pt will retrieve items from overhead shelf at 95* with right UE with -15 elbow extension Baseline: 95, -20 Goal status: INITIAL  8. Pt will retrieve items from overhead shelf at 110 with right UE with -10 elbow extension Baseline: 110, -15 Goal status: INITIAL  LONG  TERM GOALS: Target date: 07/15/2022    Pt will demonstrate understanding of memory compensations and ways to keep thinking skills sharp  Goal status: INITIAL  2.  Pt will demonstrate improved fine motor coordination for ADLs as evidenced by decreasing 9 hole peg test score for bilateral UE's by 3 secs Baseline: RUE 42.47, LUE 42.25 Goal status: INITIAL  3.  Pt will demonstrate improved ease with feeding as evidenced by decreasing PPT#2 (self feeding) by 3 secs Baseline: 16.66 secs Goal status: INITIAL  4.  Pt will demonstrate improved ease with fastening buttons as evidenced by decreasing 3 button/ unbutton time to : 95 secs Baseline: 1  min 45 secs Goal status: INITIAL  5.  Pt will write a short paragraph with 100% legibility and no significant decrease in letter size Baseline: mild micrographia Goal status: INITIAL  6.  Assess test PPT#4 and set goal prn Goal status: INITIAL   ASSESSMENT:  CLINICAL IMPRESSION: Progress limited today due to monitoring/addressing low BP and earlier episode of visual change.  Pt responded well to cueing for larger amplitude movements with coordination activities, but needed consistent min-mod cueing.  PERFORMANCE DEFICITS in functional skills including ADLs, IADLs, coordination, dexterity, sensation, ROM, strength, pain, flexibility, FMC, GMC, mobility, balance, endurance, decreased knowledge of precautions, decreased knowledge of use of DME, UE functional use, and bradykinesia, cognitive skills including attention, learn, memory, problem solving, safety awareness, and thought, and psychosocial skills including coping strategies, environmental adaptation, habits, interpersonal interactions, and routines and behaviors.   IMPAIRMENTS are limiting patient from ADLs, IADLs, rest and sleep, play, leisure, and social participation.   COMORBIDITIES may have co-morbidities  that affects occupational performance. Patient will benefit from skilled OT to address  above impairments and improve overall function.  MODIFICATION OR ASSISTANCE TO COMPLETE EVALUATION: Min-Moderate modification of tasks or assist with assess necessary to complete an evaluation.  OT OCCUPATIONAL PROFILE AND HISTORY: Detailed assessment: Review of records and additional review of physical, cognitive, psychosocial history related to current functional performance.  CLINICAL DECISION MAKING: Moderate - several treatment options, min-mod task modification necessary  REHAB POTENTIAL: Good  EVALUATION COMPLEXITY: Moderate       PLAN: OT FREQUENCY: 2x/week  OT DURATION: 12 weeks plus eval  PLANNED INTERVENTIONS: self care/ADL training, therapeutic exercise, therapeutic activity, neuromuscular re-education, manual therapy, passive range of motion, balance training, functional mobility training, aquatic therapy, ultrasound, paraffin, fluidotherapy, moist heat, cryotherapy, contrast bath, patient/family education, cognitive remediation/compensation, visual/perceptual remediation/compensation, energy conservation, coping strategies training, and DME and/or AE instructions  RECOMMENDED OTHER SERVICES: n/a  CONSULTED AND AGREED WITH PLAN OF CARE: Patient and family member/caregiver  PLAN FOR NEXT SESSION:   ADL/big movement strategies, coordination, functional reach, assess PPT #4 and establish goal   [facility], OTR/L 05/20/2022, 2:49 PM  [phone] Phone: [phone] 2:49 PM 05/20/22

## 2022-05-20 ENCOUNTER — Ambulatory Visit: Payer: Medicare (Managed Care) | Admitting: Physical Therapy

## 2022-05-20 ENCOUNTER — Ambulatory Visit: Payer: Medicare (Managed Care)

## 2022-05-20 ENCOUNTER — Encounter: Payer: Self-pay | Admitting: Occupational Therapy

## 2022-05-20 ENCOUNTER — Ambulatory Visit: Payer: Medicare (Managed Care) | Admitting: Occupational Therapy

## 2022-05-20 ENCOUNTER — Encounter: Payer: Medicare (Managed Care) | Admitting: Speech Pathology

## 2022-05-20 VITALS — BP 126/54 | HR 75

## 2022-05-20 VITALS — BP 105/64 | HR 67

## 2022-05-20 DIAGNOSIS — M6281 Muscle weakness (generalized): Secondary | ICD-10-CM

## 2022-05-20 DIAGNOSIS — R471 Dysarthria and anarthria: Secondary | ICD-10-CM

## 2022-05-20 DIAGNOSIS — R2681 Unsteadiness on feet: Secondary | ICD-10-CM

## 2022-05-20 DIAGNOSIS — R293 Abnormal posture: Secondary | ICD-10-CM

## 2022-05-20 DIAGNOSIS — R41841 Cognitive communication deficit: Secondary | ICD-10-CM

## 2022-05-20 DIAGNOSIS — R2689 Other abnormalities of gait and mobility: Secondary | ICD-10-CM

## 2022-05-20 DIAGNOSIS — R41844 Frontal lobe and executive function deficit: Secondary | ICD-10-CM

## 2022-05-20 DIAGNOSIS — R278 Other lack of coordination: Secondary | ICD-10-CM

## 2022-05-20 DIAGNOSIS — R41840 Attention and concentration deficit: Secondary | ICD-10-CM

## 2022-05-20 NOTE — Therapy (Signed)
OUTPATIENT SPEECH LANGUAGE PATHOLOGY TREATMENT NOTE   Patient Name: Jordan [name] MRN: [id number] DOB:05-Jun-1941, 81 y.o., male Today's Date: 05/20/2022  PCP: [name] MD REFERRING PROVIDER: Izora Ribas, MD   END OF SESSION:   End of Session - 05/20/22 1416     Visit Number 6    Number of Visits 25    Date for SLP Re-Evaluation 07/16/22    Authorization Type [employer]  $20 co pay per day    SLP Start Time 1445    SLP Stop Time  1530    SLP Time Calculation (min) 45 min    Activity Tolerance Patient tolerated treatment well;Patient limited by fatigue               Past Medical History:  Diagnosis Date   Arthritis    R shoulder, bone spur   Arthritis    "hands" (03/25/2016)   Chronic diastolic heart failure (HCC)    Chronic lower back pain    Coronary heart disease    Dr Pernell Dupre   H/O cardiovascular stress test    perhaps last one was 2009   Heart murmur    12/29/11 echo: mild MR, no AS, trivial TR   History of gout    Hyperlipidemia    Hypertension    saw Dr. Linard Millers last earl;y- 2014, cardiac cath. last done ?2009, blocks seen didn't require any intervention at that point.   Narcolepsy    [name] 04-11-97; Mean Latency 1.70mn, SOREM 2   OSA on CPAP    NPSG 12-13-98 AHI 22.7   PONV (postoperative nausea and vomiting)    Presence of permanent cardiac pacemaker    Shortness of breath    with exertion    [place]    Type II diabetes mellitus ([place]    Past Surgical History:  Procedure Laterality Date   CARDIAC CATHETERIZATION  2009?   EP IMPLANTABLE DEVICE N/A 03/25/2016   Procedure: Pacemaker Implant - Dual Chamber;  Surgeon: GEvans Lance MD;  [facility];  Service: Cardiovascular;  Laterality: N/A;   FRACTURE SURGERY     INSERT / REPLACE / REMOVE PACEMAKER  03/24/2016   INTRAMEDULLARY (IM) NAIL INTERTROCHANTERIC Left 02/08/2022   Procedure: INTRAMEDULLARY (IM) NAIL INTERTROCHANTRIC;  Surgeon: DMeredith Pel  MD;  [place] [facility] [place]  Service: Orthopedics;  Laterality: Left;   IR KYPHO LUMBAR INC FX REDUCE BONE BX UNI/BIL CANNULATION INC/IMAGING  12/29/2021   IR RADIOLOGIST EVAL & MGMT  12/15/2021   LEFT AND RIGHT HEART CATHETERIZATION WITH CORONARY ANGIOGRAM N/A 06/05/2014   Procedure: LEFT AND RIGHT HEART CATHETERIZATION WITH CORONARY ANGIOGRAM;  Surgeon: HSinclair Grooms MD;  [facility];  Service: Cardiovascular;  Laterality: N/A;   NASAL FRACTURE SURGERY     SHOULDER ARTHROSCOPY WITH ROTATOR CUFF REPAIR AND SUBACROMIAL DECOMPRESSION Right 08/16/2013   Procedure: SHOULDER ARTHROSCOPY WITH ROTATOR CUFF REPAIR AND SUBACROMIAL DECOMPRESSION;  Surgeon: JNita Sells MD;  [place] [facility]  Service: Orthopedics;  Laterality: Right;  Right shoulder arthroscopy rotator cuff repair, subacromial decompression.   TONSILLECTOMY     Patient Active Problem List   Diagnosis Date Noted   Acute deep vein thrombosis (DVT) of left lower extremity ([place] 04/29/2022   Closed intertrochanteric fracture of left hip, [facility]    Closed left hip fracture ([place] secondary to fall at home 02/08/2022   Paroxysmal atrial fibrillation ([place] 02/08/2022   Debility 01/08/2022   Constipation 01/07/2022  Physical deconditioning 82/95/6213   Acute metabolic encephalopathy 08/65/7846   Hypomagnesemia 01/04/2022   Pancreatitis 12/30/2021   Parkinson's disease ([place]) 12/21/2021   Parkinson disease ([place]) 12/18/2021   Frequent falls 12/18/2021   History of CVA (cerebrovascular accident) 12/18/2021   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   Morbid obesity ([place])    Acute blood loss anemia    Acute thalamic infarction ([place]) 04/15/2020   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[place]) 05/25/2019   TIA (transient ischemic attack) 02/17/2018   Type 2 diabetes mellitus with hyperlipidemia ([place]) 02/17/2018   Insomnia 02/01/2018   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   Chronotropic incompetence 07/09/2014   Dyspnea on exertion 03/03/2014   HLD (hyperlipidemia) 12/13/2007   Essential hypertension 12/13/2007   Class 1 obesity 12/05/2007   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 It's been strange doing all these things" re: Speak Out! program  PAIN:  Are you having pain? No  OBJECTIVE:     TODAY'S TREATMENT:  05-20-22: Pt entered with low 70s fading to mid 60s dB volume in opening conversation. Endorsed increased fatigue today compared to prior sessions. Some ongoing confusion exhibited re: completion of Speak Out! Program, with SLP providing additional clarification and written aids to support pt comprehension. Targeted improving vocal quality and increasing intensity through progressively difficulty speech tasks using Speak Out! program, lesson 8. ST leads pt through exercises providing occasional model prior to pt execution. Occasional mod-A required to achieve target dB this date. Averages this date: loud  "ah" 85 dB; reading 79 dB; cognitive speech task 76 dB. Effective pausing to formulate thoughts exhibited. Conversational sample of approx 5 minutes, pt averaged upper 60s dB with occasional to usual in-A to utilize intentional speech.  05-17-22: Re-educated principles of intent due to reported reduced comprehension and recall of pertinence for increasing vocal intensity. Re-educated purpose of program to focus on loudness as pt noted to focus on irrelevant or minute details (ex: syllable count). Targeted improving vocal quality and increasing intensity through progressively difficulty speech tasks using Speak Out! program, lesson 6. ST leads pt through exercises providing occasional model prior to pt execution. Usual fading occasional min-A required to achieve target dB this date. Averages this date: loud "ah" 85 dB; reading 74 dB; cognitive speech task 72 dB. Conversational sample of approx 5 minutes, pt averaged lower 70s dB with occasional to usual in-A to utilize intentional speech.  05-14-22: Pt with mild sub optimal volume upon entering therapy session, mid 60 dB. Awareness exhibited of low volume with pt able to correct with  min-A from SLP. Pt questions overall purpose of ST. Generated personal goal of being able to project voice during reenactments or sharing a presentation on a revolutionary war story. Pt with questions regarding program execution, SLP provides written instructions answering all questions, models exercises for pt, and provides verbal feedback. Following, SLP leads pt through exercises providing usual model prior to pt execution to aid comprehension and completion. Occasional min to mod-A required to achieve targeted dB this date. Averages this date: loud "ah" 86 dB; counting 82 dB; reading  dB; cognitive speech task 82 dB. Intermittent side comments required usual cues to optimize vocal intensity from mid 60s dB to low 70s dB.  05-10-22: Pt returned with Speak Out! Workbook. Endorsed  his need for additional training due to reduced recall since last ST session. Pt also inquired how to determine about appropriate loudness in conversation, in which SLP utilized audio feedback to highlight decay in vocal intensity and clarity short 2 minute speech sample. Pt demonstrated increased sensory awareness of decline in volume and clarity. Re-educated Speak Out! Program principles and rationale to optimize carryover of dysarthria compensations. SLP leads pt through exercises providing usual model prior to pt execution to aid comprehension and completion. Occasional min to mod-A required to achieve targeted dB this date. Averages this date: loud "ah" 88 dB (~5-8 seconds); reading 77 dB; cognitive speech task 75 dB. Intermittent side comments required usual cues to optimize vocal intensity from mid 60s dB to low 70s dB.  04-28-22: Re-educated rationale and principles of Speak Out! Program for hypokinetic dysarthria. Targeted improving vocal quality and increasing intensity through progressively difficulty speech tasks using Speak Out! program, lesson 1. ST leads pt through exercises providing usual model prior to pt execution. Occasional min to mod-A required to achieve target dB this date. Averages this date: loud "ah" 89 dB (~8 seconds); reading 75 dB; cognitive speech task 75 dB. Intermittent side comments required usual cues to optimize vocal intensity from mid 60s dB to low 70s dB.    PATIENT EDUCATION: Education details: see above Person educated: Patient and Spouse Education method: Explanation, Demonstration, and Handouts Education comprehension: verbalized understanding, returned demonstration, and needs further education     HOME EXERCISE PROGRAM: Order Speak Out! book     GOALS: Goals reviewed with patient? Yes   SHORT TERM GOALS: Target date: 05/19/2022     Pt will complete Speak Out! HEP at least 1x/day given occasional min A over 2 sessions  Baseline: 05-17-22 Goal status:  Met   2.  Pt will achieve targeted dB (85-90 dB) on warm up exercises with 80% accuracy given occasional min A over 2 sessions  Baseline:  Goal status: Partially Met   3.  Pt will achieve targeted dB (75-85 dB) on reading exercises with 80% accuracy given occasional min A over 2 sessions Baseline: 05-10-22, 05-17-22 Goal status: Met   4.  Pt will achieve targeted dB (72-78 dB) on cognitive exercises with 80% accuracy given occasional min A over 2 sessions Baseline: 05-10-22, 7-11-07-21 Goal status: Met   5.  Pt will utilize dysarthria compensations in 5-10 minute conversation to optimize vocal intensity and clarity given occasional mod A over 2 sessions  Baseline:  Goal status: Partially Met   6.  Pt will implement memory/attention compensations to aid daily functioning given occasional mod A over 2 sessions  Baseline:  Goal status: Partially Met   LONG TERM GOALS: Target date: 07/16/2022    Pt will complete Speak Out! HEP at  least 1x/day (BID recommended) > 1 week  Baseline:  Goal status: ongoing   2.  Pt will achieve targeted dB levels in demonstration of Speak Out! Lessons with 90% accuracy given rare min A over 2 sessions  Baseline:  Goal status: ongoing   3.  Pt will utilize dysarthria compensations in 15+ minute conversation to optimize vocal intensity and clarity given occasional min A over 2 sessions Baseline:  Goal status: ongoing   4.  Pt will implement memory/attention compensations to aid daily functioning given occasional min A over 2 sessions  Baseline:  Goal status: ongoing   5.  Pt will report improved communication effectiveness via PROM by 2 point improvement by last ST session  Baseline: CPIB=26 Goal status: ongoing   ASSESSMENT:   CLINICAL IMPRESSION: Patient is a 81 y.o. male who was seen today for previous thalamic stroke and Parkinson's disease. 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 Conducted ongoing education and training of Speak Out! Program to optimize vocal intensity and clarity. Skilled ST is warranted to address dysarthria and cognition to maximize communication effectiveness and functional independence.    OBJECTIVE IMPAIRMENTS  Objective impairments include attention, memory, and dysarthria. These impairments are limiting patient from household responsibilities and effectively communicating at home and in community.Factors affecting potential to achieve goals and functional outcome are medical prognosis and previous level of function. Patient will benefit from skilled SLP services to address above impairments and improve overall function.   REHAB POTENTIAL: Good   PLAN: SLP FREQUENCY: 2x/week   SLP DURATION: 12 weeks   PLANNED INTERVENTIONS: Cueing hierachy, Cognitive reorganization, Internal/external aids, Functional tasks, Multimodal communication approach, SLP instruction and feedback, Compensatory strategies, and Patient/family education  Marzetta Board, CCC-SLP 05/20/2022, 4:19 PM

## 2022-05-20 NOTE — Therapy (Deleted)
OUTPATIENT SPEECH LANGUAGE PATHOLOGY TREATMENT NOTE   Patient Name: Jordan [name] MRN: [id number] DOB:Dec 31, 1940, 81 y.o., male Today's Date: 05/20/2022  PCP: [name] MD REFERRING PROVIDER: Izora Ribas, MD   END OF SESSION:       Past Medical History:  Diagnosis Date   Arthritis    R shoulder, bone spur   Arthritis    "hands" (03/25/2016)   Chronic diastolic heart failure (HCC)    Chronic lower back pain    Coronary heart disease    Dr Pernell Dupre   H/O cardiovascular stress test    perhaps last one was 2009   Heart murmur    12/29/11 echo: mild MR, no AS, trivial TR   History of gout    Hyperlipidemia    Hypertension    saw Dr. Linard Millers last earl;y- 2014, cardiac cath. last done ?2009, blocks seen didn't require any intervention at that point.   Narcolepsy    [name] 04-11-97; Mean Latency 1.81min, SOREM 2   OSA on CPAP    NPSG 12-13-98 AHI 22.7   PONV (postoperative nausea and vomiting)    Presence of permanent cardiac pacemaker    Shortness of breath    with exertion    Stroke ([place])    Type II diabetes mellitus ([place])    Past Surgical History:  Procedure Laterality Date   CARDIAC CATHETERIZATION  2009?   EP IMPLANTABLE DEVICE N/A 03/25/2016   Procedure: Pacemaker Implant - Dual Chamber;  Surgeon: Evans Lance, MD;  Location: [place] [facility];  Service: Cardiovascular;  Laterality: N/A;   FRACTURE SURGERY     INSERT / REPLACE / REMOVE PACEMAKER  03/24/2016   INTRAMEDULLARY (IM) NAIL INTERTROCHANTERIC Left 02/08/2022   Procedure: INTRAMEDULLARY (IM) NAIL INTERTROCHANTRIC;  Surgeon: Meredith Pel, MD;  Location: [place];  Service: Orthopedics;  Laterality: Left;   IR KYPHO LUMBAR INC FX REDUCE BONE BX UNI/BIL CANNULATION INC/IMAGING  12/29/2021   IR RADIOLOGIST EVAL & MGMT  12/15/2021   LEFT AND RIGHT HEART CATHETERIZATION WITH CORONARY ANGIOGRAM N/A 06/05/2014   Procedure: LEFT AND RIGHT HEART CATHETERIZATION WITH CORONARY ANGIOGRAM;  Surgeon: Sinclair Grooms, MD;  Location: [facility] LAB;  Service: Cardiovascular;  Laterality: N/A;   NASAL FRACTURE SURGERY     SHOULDER ARTHROSCOPY WITH ROTATOR CUFF REPAIR AND SUBACROMIAL DECOMPRESSION Right 08/16/2013   Procedure: SHOULDER ARTHROSCOPY WITH ROTATOR CUFF REPAIR AND SUBACROMIAL DECOMPRESSION;  Surgeon: [name], MD;  Location: [place];  Service: Orthopedics;  Laterality: Right;  Right shoulder arthroscopy rotator cuff repair, subacromial decompression.   TONSILLECTOMY     Patient Active Problem List   Diagnosis Date Noted   Acute deep vein thrombosis (DVT) of left lower extremity ([place]) 04/29/2022   Closed intertrochanteric fracture of left hip, initial encounter [facility])    Closed left hip fracture ([place]) secondary to fall at home 02/08/2022   Paroxysmal atrial fibrillation ([place]) 02/08/2022   Debility 01/08/2022   Constipation 01/07/2022   Physical deconditioning 40/98/1191   Acute metabolic encephalopathy 47/82/9562   Hypomagnesemia 01/04/2022   Pancreatitis 12/30/2021   Parkinson's disease ([place]) 12/21/2021   Parkinson disease ([place]) 12/18/2021   Frequent falls 12/18/2021   History of CVA (cerebrovascular accident) 12/18/2021   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   Hallucinations, visual 07/30/2021   Acetabulum fracture, left ([place]) 06/25/2021   Acute hip pain, left 06/24/2021   Fall at home, initial encounter 06/24/2021   Leukocytosis 06/24/2021   Chronic diastolic heart failure ([place])  Sinusitis, acute maxillary 06/24/2020   AKI (acute kidney injury) ([place])    Labile blood glucose    Cough    Benign essential HTN    Skin lesion of hand    Hypoalbuminemia due to protein-calorie malnutrition ([place])    Controlled type 2 diabetes mellitus with hyperglycemia, without long-term current use of insulin ([place])    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   Morbid obesity ([place])    Acute blood loss anemia    Acute thalamic infarction ([place]) 04/15/2020   Chronotropic incompetence with  sinus node dysfunction ([place]) 05/25/2019   TIA (transient ischemic attack) 02/17/2018   Type 2 diabetes mellitus with hyperlipidemia ([place]) 02/17/2018   Insomnia 02/01/2018   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   Chronotropic incompetence 07/09/2014   Dyspnea on exertion 03/03/2014   HLD (hyperlipidemia) 12/13/2007   Essential hypertension 12/13/2007   Class 1 obesity 12/05/2007   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  Rationale for Evaluation and Treatment Rehabilitation  SUBJECTIVE: ***  PAIN:  Are you having pain? No  OBJECTIVE:     TODAY'S TREATMENT:  05-20-22: ***  05-17-22: Re-educated principles of intent due to reported reduced comprehension and recall of pertinence for increasing vocal intensity. Re-educated purpose of program to focus on loudness as pt noted to focus on irrelevant or minute details (ex: syllable count). Targeted improving vocal quality and increasing intensity through progressively difficulty speech tasks using Speak Out! program, lesson 6. ST leads pt through exercises providing occasional model prior to pt execution. Usual fading occasional min-A required to achieve target dB this date. Averages this date: loud "ah" 85 dB; reading 74 dB; cognitive speech task 72 dB. Conversational sample of approx 5 minutes, pt averaged lower 70s dB with occasional to usual in-A to utilize intentional speech.  05-14-22: Pt with mild sub optimal volume upon entering therapy session, mid 60 dB. Awareness exhibited of low volume with pt able to correct with min-A from SLP. Pt questions overall purpose of ST. Generated personal goal of being able to project voice during reenactments or sharing a presentation  on a revolutionary war story. Pt with questions regarding program execution, SLP provides written instructions answering all questions, models exercises for pt, and provides verbal feedback. Following, SLP leads pt through exercises providing usual model prior to pt execution to aid comprehension and completion. Occasional min to mod-A required to achieve targeted dB this date. Averages this date: loud "ah" 86 dB; counting 82 dB; reading  dB; cognitive speech task 82 dB. Intermittent side comments required usual cues to optimize vocal intensity from mid 60s dB to low 70s dB.  PATIENT EDUCATION: Education details: see above Person educated: Patient and Spouse Education method: Explanation, Demonstration, and Handouts Education comprehension: verbalized understanding, returned demonstration, and needs further education     HOME EXERCISE PROGRAM: Order Speak Out! book     GOALS: Goals reviewed with patient? Yes   SHORT TERM GOALS: Target date: 05/19/2022     Pt will complete Speak Out! HEP at least 1x/day given occasional min A over 2 sessions  Baseline: 05-17-22 Goal status: ongoing   2.  Pt will achieve targeted dB (85-90 dB) on warm up exercises with 80% accuracy given occasional min A over 2 sessions  Baseline:  Goal status:  ongoing   3.  Pt will achieve targeted dB (75-85 dB) on reading exercises with 80% accuracy given occasional min A over 2 sessions Baseline: 05-10-22, 05-17-22 Goal status: Met   4.  Pt will achieve targeted dB (72-78 dB) on cognitive exercises with 80% accuracy given occasional min A over 2 sessions Baseline: 05-10-22, 7-11-07-21 Goal status: Met   5.  Pt will utilize dysarthria compensations in 5-10 minute conversation to optimize vocal intensity and clarity given occasional mod A over 2 sessions  Baseline:  Goal status: ongoing   6.  Pt will implement memory/attention compensations to aid daily functioning given occasional mod A over 2 sessions  Baseline:   Goal status: ongoing   LONG TERM GOALS: Target date: 07/16/2022    Pt will complete Speak Out! HEP at least 1x/day (BID recommended) > 1 week  Baseline:  Goal status: ongoing   2.  Pt will achieve targeted dB levels in demonstration of Speak Out! Lessons with 90% accuracy given rare min A over 2 sessions  Baseline:  Goal status: ongoing   3.  Pt will utilize dysarthria compensations in 15+ minute conversation to optimize vocal intensity and clarity given occasional min A over 2 sessions Baseline:  Goal status: ongoing   4.  Pt will implement memory/attention compensations to aid daily functioning given occasional min A over 2 sessions  Baseline:  Goal status: ongoing   5.  Pt will report improved communication effectiveness via PROM by 2 point improvement by last ST session  Baseline: CPIB=26 Goal status: ongoing   ASSESSMENT:   CLINICAL IMPRESSION: Patient is a 81 y.o. male who was seen today for previous thalamic stroke and Parkinson's disease. 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 Conducted education and training of Speak Out! Program to optimize vocal intensity and clarity. Skilled ST is warranted to address dysarthria and cognition to maximize communication effectiveness and functional independence.    OBJECTIVE IMPAIRMENTS  Objective impairments include attention, memory, and dysarthria. These impairments are limiting patient from household responsibilities and effectively communicating at home and in community.Factors affecting potential to achieve goals and functional outcome are medical prognosis and previous level of function. Patient will benefit from skilled SLP services to address above impairments and improve overall function.   REHAB POTENTIAL: Good   PLAN: SLP FREQUENCY: 2x/week   SLP DURATION: 12 weeks   PLANNED INTERVENTIONS: Cueing hierachy, Cognitive reorganization, Internal/external  aids, Functional tasks, Multimodal communication approach, SLP instruction and feedback, Compensatory strategies, and Patient/family education  Su Monks, CCC-SLP 05/20/2022, 10:48 AM

## 2022-05-20 NOTE — Therapy (Signed)
OUTPATIENT PHYSICAL THERAPY TREATMENT NOTE   Patient Name: Jordan [name] MRN: [id number] DOB:1941/04/08, 81 y.o., male Today's Date: 05/20/2022  PCP: [name], MD REFERRING PROVIDER: [name], MD  END OF SESSION:   PT End of Session - 05/20/22 1408     Visit Number 5    Number of Visits 17    Date for PT Re-Evaluation 07/21/22    Authorization Type [employer]    PT Start Time 1407   Handoff with OT   PT Stop Time 1445    PT Time Calculation (min) 38 min    Equipment Utilized During Treatment Gait belt    Activity Tolerance Patient tolerated treatment well;Treatment limited secondary to medical complications (Comment)   Hypotension   Behavior During Therapy WFL for tasks assessed/performed              Past Medical History:  Diagnosis Date   Arthritis    R shoulder, bone spur   Arthritis    "hands" (03/25/2016)   Chronic diastolic heart failure (HCC)    Chronic lower back pain    Coronary heart disease    Dr Pernell Dupre   H/O cardiovascular stress test    perhaps last one was 2009   Heart murmur    12/29/11 echo: mild MR, no AS, trivial TR   History of gout    Hyperlipidemia    Hypertension    saw Dr. Linard Millers last earl;y- 2014, cardiac cath. last done ?2009, blocks seen didn't require any intervention at that point.   Narcolepsy    [name] 04-11-97; Mean Latency 1.31min, SOREM 2   OSA on CPAP    NPSG 12-13-98 AHI 22.7   PONV (postoperative nausea and vomiting)    Presence of permanent cardiac pacemaker    Shortness of breath    with exertion    Stroke ([place])    Type II diabetes mellitus ([place])    Past Surgical History:  Procedure Laterality Date   CARDIAC CATHETERIZATION  2009?   EP IMPLANTABLE DEVICE N/A 03/25/2016   Procedure: Pacemaker Implant - Dual Chamber;  Surgeon: Evans Lance, MD;  Location: [facility];  Service: Cardiovascular;  Laterality: N/A;   FRACTURE SURGERY     INSERT / REPLACE / REMOVE PACEMAKER  03/24/2016    INTRAMEDULLARY (IM) NAIL INTERTROCHANTERIC Left 02/08/2022   Procedure: INTRAMEDULLARY (IM) NAIL INTERTROCHANTRIC;  Surgeon: Meredith Pel, MD;  Location: [facility];  Service: Orthopedics;  Laterality: Left;   IR KYPHO LUMBAR INC FX REDUCE BONE BX UNI/BIL CANNULATION INC/IMAGING  12/29/2021   IR RADIOLOGIST EVAL & MGMT  12/15/2021   LEFT AND RIGHT HEART CATHETERIZATION WITH CORONARY ANGIOGRAM N/A 06/05/2014   Procedure: LEFT AND RIGHT HEART CATHETERIZATION WITH CORONARY ANGIOGRAM;  Surgeon: Sinclair Grooms, MD;  Location: [facility];  Service: Cardiovascular;  Laterality: N/A;   NASAL FRACTURE SURGERY     SHOULDER ARTHROSCOPY WITH ROTATOR CUFF REPAIR AND SUBACROMIAL DECOMPRESSION Right 08/16/2013   Procedure: SHOULDER ARTHROSCOPY WITH ROTATOR CUFF REPAIR AND SUBACROMIAL DECOMPRESSION;  Surgeon: [name], MD;  Location: [facility];  Service: Orthopedics;  Laterality: Right;  Right shoulder arthroscopy rotator cuff repair, subacromial decompression.   TONSILLECTOMY     Patient Active Problem List   Diagnosis Date Noted   Acute deep vein thrombosis (DVT) of left lower extremity ([place]) 04/29/2022   Closed intertrochanteric fracture of left hip, initial encounter ([place])    Closed left hip fracture ([place]) secondary to fall at  home 02/08/2022   Paroxysmal atrial fibrillation ([place]) 02/08/2022   Debility 01/08/2022   Constipation 01/07/2022   Physical deconditioning 93/71/6967   Acute metabolic encephalopathy 89/38/1017   Hypomagnesemia 01/04/2022   Pancreatitis 12/30/2021   Parkinson's disease ([place]) 12/21/2021   Parkinson disease ([place]) 12/18/2021   Frequent falls 12/18/2021   History of CVA (cerebrovascular accident) 12/18/2021   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   Hallucinations, visual 07/30/2021   Acetabulum fracture, left ([place]) 06/25/2021   Acute hip pain, left 06/24/2021   Fall at home, initial encounter 06/24/2021   Leukocytosis 06/24/2021   Chronic diastolic heart failure  ([place])    Sinusitis, acute maxillary 06/24/2020   AKI (acute kidney injury) ([place])    Labile blood glucose    Cough    Benign essential HTN    Skin lesion of hand    Hypoalbuminemia due to protein-calorie malnutrition ([place])    Controlled type 2 diabetes mellitus with hyperglycemia, without long-term current use of insulin (HCC)    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   Morbid obesity ([place])    Acute blood loss anemia    Acute thalamic infarction ([place]) 04/15/2020   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   TIA (transient ischemic attack) 02/17/2018   Type 2 diabetes mellitus with hyperlipidemia ([place]) 02/17/2018   Insomnia 02/01/2018   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   Chronotropic incompetence 07/09/2014   Dyspnea on exertion 03/03/2014   HLD (hyperlipidemia) 12/13/2007   Essential hypertension 12/13/2007   Class 1 obesity 12/05/2007   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   Coronary atherosclerosis 12/05/2007    REFERRING DIAG: I63.81 (ICD-10-CM) - 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[place]) G20 (ICD-10-CM) - Parkinson's disease ([place]) S72.145S (ICD-10-CM) - Type III open nondisplaced intertrochanteric fracture of left femur, sequela    THERAPY DIAG:  Other abnormalities of gait and mobility  Muscle weakness (generalized)  Rationale for Evaluation and Treatment Rehabilitation  PERTINENT HISTORY: PMHx: PD (diagnosed Jan 2022), HLD, OSA, DM, HTN, chronic diastolic HF, CVA, frequent falls, L2 compression fx, L hip fx   PRECAUTIONS: Fall and ICD/Pacemaker, history of compression fracture at L2 with recent kyphoplasty   SUBJECTIVE: Pt reports having an "episode" this morning where he got dizzy and had blurred vision. Per OT from previous session seated BP 105/64. Pt's wife reports giving him his BP meds this AM but not checking BP prior to administering. Pt reports feeling fatigued this PM. Pt reports some abdominal pain/discomfort but is having regular bowel  movements. Pt's wife also reports he had BPPV last year that went untreated.    VITALS Vitals:   05/20/22 1417  BP: (!) 126/54  Pulse: 75     PAIN:  Are you having pain? Yes: NPRS scale: did not provide number/10 Pain location: abdomen Pain description: achy Aggravating factors: chocolate, caffeine Relieving factors: N/A OBJECTIVE  TODAY'S TREATMENT:   Self-care/home management  Reiterated education provided by OT regarding orthostatics and effect of Sinemet on BP. Reassessed BP (see below) and encouraged wife to check BP first thing in AM.   Ther Act  POSITIONAL TESTS:  Right Dix-Hallpike: upbeating, right nystagmus Left Dix-Hallpike: no nystagmus   VESTIBULAR TREATMENT:  Canalith Repositioning: Epley Right: Number of Reps: 2 and Response to Treatment: comment: noted decreased duration of nystagmus, mild dizziness still reported. Unable to achieve cervical extension past neutral for testing and pt has difficulty keeping EO during testing to view nystagmus. Could potentially be a cupulolithiasis, will determine in future session     PATIENT EDUCATION:  Education details: See self-care and subjective  Person educated: Patient and Spouse Education method: Explanation Education comprehension: verbalized understanding     HOME EXERCISE PROGRAM: Access Code: [email or web address] URL: [email or web address] Date: 04/23/2022 Prepared by: Estevan Ryder  Exercises - Clamshell  - 1 x daily - 7 x weekly - 3 sets - 10 reps - Seated Scapular Retraction  - 1 x daily - 7 x weekly - 3 sets - 10 reps - Seated Long Arc Quad  - 1 x daily - 7 x weekly - 3 sets - 10 reps - Seated Hamstring Stretch  - 1 x daily - 7 x weekly - 3 sets - 10 reps - Sit to Stand  - 1 x daily - 7 x weekly - 3 sets - 10 reps - Seated Heel Raise  - 1 x daily - 7 x weekly - 3 sets - 10 reps - Seated March  - 1 x daily - 7 x weekly - 3 sets - 10 reps - Seated Flexion Stretch with Swiss Ball  - 1 x daily - 7  x weekly - 3 sets - 10 reps          GOALS: Goals reviewed with patient? Yes   SHORT TERM GOALS: Target date: 05/19/2022   Pt will be able to perform initial HEP for strengthening and balance to continue gains with supervision of wife. Baseline: Goal status: MET   2.  BERG to be assessed with STG/LTG written.  Baseline: 20/56 Goal status: MET   3.  Pt will improve 5x sit<>stand to less than or equal to 19 sec without UE support to demonstrate improved functional strength and transfer efficiency.   Baseline: 22.6 seconds without UE support from chair; 21seconds without UE support  Goal status: NOT MET   4.  Pt will ambulate >350' with RW mod I for improved household and short community distances. Baseline: requires CGA/MinA/ limited to 230' Goal status: NOT MET   5.  Pt will improve gait speed with RW to at least 1.3 ft/sec in order to demo improved community mobility.    Baseline:  .87 ft/sec with RW; .81f/s (0.33m) Goal status: NOT MET       LONG TERM GOALS: Target date: 06/16/2022     Pt will be independent with final HEP for strength,gait, balance in order to build upon functional gains made in therapy.   Baseline:  Goal status: INITIAL   2.  Patient will score >25/56 on Berg to demonstrate improved balance Baseline: 20/56 Goal status: REVISED   3.  Pt will improve TUG time with RW vs. LRAD to 26 seconds or less in order to demo decrease fall risk.   Baseline: 33.32 seconds with RW  Goal status: INITIAL   4.  Pt will improve gait speed with RW vs. LRAD to at least 1.9 ft/sec in order to demo improved community mobility.  Baseline: .87 ft/sec with RW Goal status: INITIAL   5.  Pt will ambulate >350' with RW versus LRAD on paved surfaces with supervision for improved community mobility. Baseline: 230' Goal status: REVISED     ASSESSMENT:   CLINICAL IMPRESSION: Emphasis of skilled session on patient education and BPPV assessment. Pt reports having  "episode" this morning in which his central vision was obscured, which lasted >30 minutes. PT and OT educated pt on potential TIA and importance of checking BP. BP running low today, pt unable to verbalize symptoms. Pt's wife reports history of BPPV that went untreated. Attempted to  perform Dix-Hallpike and Epley maneuver today, but due to pt's limited cervical extension and inability to maintain eyes open in supine, therapist unable to clear BPPV. Pt will benefit from further canalith repositioning. Continue POC.     OBJECTIVE IMPAIRMENTS Abnormal gait, decreased activity tolerance, decreased balance, decreased coordination, decreased endurance, decreased knowledge of use of DME, decreased mobility, decreased ROM, decreased strength, impaired flexibility, postural dysfunction, and pain.    ACTIVITY LIMITATIONS cleaning, community activity, driving, meal prep, laundry, yard work, and shopping.    PERSONAL FACTORS Age, Past/current experiences, Time since onset of injury/illness/exacerbation, and 3+ comorbidities:  PD, HLD, OSA, DM, HTN, chronic diastolic HF, CVA, frequent falls, L2 compression fx, L hip fx   are also affecting patient's functional outcome.      REHAB POTENTIAL: Good   CLINICAL DECISION MAKING: Evolving/moderate complexity   EVALUATION COMPLEXITY: Moderate   PLAN: PT FREQUENCY: 2x/week   PT DURATION: 12 weeks, plus eval   PLANNED INTERVENTIONS: Therapeutic exercises, Therapeutic activity, Neuromuscular re-education, Balance training, Gait training, Patient/Family education, Joint mobilization, Stair training, Vestibular training, Canalith repositioning, DME instructions, and Manual therapy   PLAN FOR NEXT SESSION: BPPV assessment, posture, functional strength/endurance, add balance to HEP, gait training, weight shifting to L  Jazzelle Zhang E Caulin Begley, PT, DPT 05/20/2022, 2:58 PM

## 2022-05-20 NOTE — Patient Instructions (Addendum)
05-21-22: Lesson 8 AM      Lesson 8 PM  05-22-22: Lesson 9 AM      Lesson 9 PM  05-23-22: Lesson 10 AM      Lesson 10 PM  05-24-22: Lesson 11 AM      Lesson 11 PM   Please bring your phone to next speech therapy session so we can troubleshoot how this can be a helpful memory tool for you

## 2022-05-24 ENCOUNTER — Ambulatory Visit: Payer: Medicare (Managed Care) | Admitting: Occupational Therapy

## 2022-05-24 ENCOUNTER — Ambulatory Visit: Payer: Medicare (Managed Care) | Admitting: Physical Therapy

## 2022-05-24 ENCOUNTER — Ambulatory Visit: Payer: Medicare (Managed Care) | Admitting: Speech Pathology

## 2022-05-24 ENCOUNTER — Encounter: Payer: Self-pay | Admitting: Speech Pathology

## 2022-05-24 VITALS — BP 125/65 | HR 73

## 2022-05-24 DIAGNOSIS — R471 Dysarthria and anarthria: Secondary | ICD-10-CM

## 2022-05-24 DIAGNOSIS — M6281 Muscle weakness (generalized): Secondary | ICD-10-CM

## 2022-05-24 DIAGNOSIS — R251 Tremor, unspecified: Secondary | ICD-10-CM

## 2022-05-24 DIAGNOSIS — R41840 Attention and concentration deficit: Secondary | ICD-10-CM

## 2022-05-24 DIAGNOSIS — R1312 Dysphagia, oropharyngeal phase: Secondary | ICD-10-CM

## 2022-05-24 DIAGNOSIS — R2689 Other abnormalities of gait and mobility: Secondary | ICD-10-CM

## 2022-05-24 DIAGNOSIS — R278 Other lack of coordination: Secondary | ICD-10-CM

## 2022-05-24 DIAGNOSIS — R293 Abnormal posture: Secondary | ICD-10-CM

## 2022-05-24 DIAGNOSIS — R41844 Frontal lobe and executive function deficit: Secondary | ICD-10-CM

## 2022-05-24 DIAGNOSIS — R2681 Unsteadiness on feet: Secondary | ICD-10-CM

## 2022-05-24 DIAGNOSIS — R41841 Cognitive communication deficit: Secondary | ICD-10-CM

## 2022-05-24 NOTE — Therapy (Signed)
OUTPATIENT SPEECH LANGUAGE PATHOLOGY TREATMENT NOTE   Patient Name: Jordan [name] MRN: [id number] DOB:1941/08/24, 81 y.o., male Today's Date: 05/24/2022  PCP: [name] MD REFERRING PROVIDER: Izora Ribas, MD   END OF SESSION:   End of Session - 05/24/22 1416     Visit Number 7    Number of Visits 25    Date for SLP Re-Evaluation 07/16/22    Authorization Type [employer]  $20 co pay per day    SLP Start Time 52    SLP Stop Time  1445    SLP Time Calculation (min) 45 min    Activity Tolerance Patient tolerated treatment well               Past Medical History:  Diagnosis Date   Arthritis    R shoulder, bone spur   Arthritis    "hands" (03/25/2016)   Chronic diastolic heart failure (HCC)    Chronic lower back pain    Coronary heart disease    Dr Pernell Dupre   H/O cardiovascular stress test    perhaps last one was 2009   Heart murmur    12/29/11 echo: mild MR, no AS, trivial TR   History of gout    Hyperlipidemia    Hypertension    saw Dr. Linard Millers last earl;y- 2014, cardiac cath. last done ?2009, blocks seen didn't require any intervention at that point.   Narcolepsy    [name] 04-11-97; Mean Latency 1.22mn, SOREM 2   OSA on CPAP    NPSG 12-13-98 AHI 22.7   PONV (postoperative nausea and vomiting)    Presence of permanent cardiac pacemaker    Shortness of breath    with exertion    Stroke ([place]    Type II diabetes mellitus ([place]    Past Surgical History:  Procedure Laterality Date   CARDIAC CATHETERIZATION  2009?   EP IMPLANTABLE DEVICE N/A 03/25/2016   Procedure: Pacemaker Implant - Dual Chamber;  Surgeon: GEvans Lance MD;  [facility];  Service: Cardiovascular;  Laterality: N/A;   FRACTURE SURGERY     INSERT / REPLACE / REMOVE PACEMAKER  03/24/2016   INTRAMEDULLARY (IM) NAIL INTERTROCHANTERIC Left 02/08/2022   Procedure: INTRAMEDULLARY (IM) NAIL INTERTROCHANTRIC;  Surgeon: DMeredith Pel MD;  [facility]   Service: Orthopedics;  Laterality: Left;   IR KYPHO LUMBAR INC FX REDUCE BONE BX UNI/BIL CANNULATION INC/IMAGING  12/29/2021   IR RADIOLOGIST EVAL & MGMT  12/15/2021   LEFT AND RIGHT HEART CATHETERIZATION WITH CORONARY ANGIOGRAM N/A 06/05/2014   Procedure: LEFT AND RIGHT HEART CATHETERIZATION WITH CORONARY ANGIOGRAM;  Surgeon: HSinclair Grooms MD;  [facility];  Service: Cardiovascular;  Laterality: N/A;   NASAL FRACTURE SURGERY     SHOULDER ARTHROSCOPY WITH ROTATOR CUFF REPAIR AND SUBACROMIAL DECOMPRESSION Right 08/16/2013   Procedure: SHOULDER ARTHROSCOPY WITH ROTATOR CUFF REPAIR AND SUBACROMIAL DECOMPRESSION;  Surgeon: JNita Sells MD;  [place] [facility]  Service: Orthopedics;  Laterality: Right;  Right shoulder arthroscopy rotator cuff repair, subacromial decompression.   TONSILLECTOMY     Patient Active Problem List   Diagnosis Date Noted   Acute deep vein thrombosis (DVT) of left lower extremity ([place] 04/29/2022   Closed intertrochanteric fracture of left hip, initial encounter ([facility]    Closed left [place] fracture [place] to fall at [place] 02/08/2022   Paroxysmal atrial fibrillation ([place] 02/08/2022   Debility 01/08/2022   Constipation 01/07/2022   Physical deconditioning  37/90/2409   Acute metabolic encephalopathy 73/53/2992   Hypomagnesemia 01/04/2022   Pancreatitis 12/30/2021   Parkinson's disease ([place]) 12/21/2021   Parkinson disease ([place]) 12/18/2021   Frequent falls 12/18/2021   History of CVA (cerebrovascular accident) 12/18/2021   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   Hallucinations, visual 07/30/2021   Acetabulum fracture, left ([place]) 06/25/2021   Acute hip pain, left 06/24/2021   Fall at home, initial encounter 06/24/2021   Leukocytosis 06/24/2021   Chronic diastolic heart failure ([place])    Sinusitis, acute maxillary 06/24/2020   AKI (acute kidney injury) ([place])    Labile blood glucose    Cough    Benign essential HTN    Skin lesion of hand     Hypoalbuminemia due to protein-calorie malnutrition ([place])    Controlled type 2 diabetes mellitus with hyperglycemia, without long-term current use of insulin (HCC)    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   Morbid obesity ([place])    Acute blood loss anemia    Acute thalamic infarction ([place]) 04/15/2020   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[place]) 05/25/2019   TIA (transient ischemic attack) 02/17/2018   Type 2 diabetes mellitus with hyperlipidemia ([place]) 02/17/2018   Insomnia 02/01/2018   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   Chronotropic incompetence 07/09/2014   Dyspnea on exertion 03/03/2014   HLD (hyperlipidemia) 12/13/2007   Essential hypertension 12/13/2007   Class 1 obesity 12/05/2007   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 It's been strange doing all these things" re: Speak Out! program  PAIN:  Are you having pain? No  OBJECTIVE:     TODAY'S TREATMENT:   05-24-22: Pt enters with low 70's to mid 60's dB. Targeted improving vocal quality and increasing intensity through progressively difficulty speech tasks using Speak Out! program, lesson 11. ST leads pt through exercises providing frequent model prior to pt execution. Usual mod-A required to achieve target dB this date. Averages this date: loud "ah" 90 dB; reading 84 dB; cognitive speech task 73 dB. Effective pausing to formulate thoughts exhibited. Conversational sample of approx 3 minutes, pt averaged upper 67 dB with occasional to usual in-A to utilize intentional speech. He required  clarification that sustained loud /a/ should be consistent pitch as he was gradually increasing pitch during /a/. Spouse present. Modeled cueing and instructed her the exercises should be done twice a day. Showed where to access online practice.  05-20-22: Pt entered with low 70s fading to mid 60s dB volume in opening conversation. Endorsed increased fatigue today compared to prior sessions. Some ongoing confusion exhibited re: completion of Speak Out! Program, with SLP providing additional clarification and written aids to support pt comprehension. Targeted improving vocal quality and increasing intensity through progressively difficulty speech tasks using Speak Out! program, lesson 8. ST leads pt through exercises providing occasional model prior to pt execution. Occasional mod-A required to achieve target dB this date. Averages this date: loud "ah" 85 dB; reading 79 dB; cognitive speech task 76 dB. Effective pausing to formulate thoughts exhibited. Conversational sample of approx 5 minutes, pt averaged upper 60s dB with occasional to usual in-A to utilize intentional speech.  05-17-22: Re-educated principles of intent due to  reported reduced comprehension and recall of pertinence for increasing vocal intensity. Re-educated purpose of program to focus on loudness as pt noted to focus on irrelevant or minute details (ex: syllable count). Targeted improving vocal quality and increasing intensity through progressively difficulty speech tasks using Speak Out! program, lesson 6. ST leads pt through exercises providing occasional model prior to pt execution. Usual fading occasional min-A required to achieve target dB this date. Averages this date: loud "ah" 85 dB; reading 74 dB; cognitive speech task 72 dB. Conversational sample of approx 5 minutes, pt averaged lower 70s dB with occasional to usual in-A to utilize intentional speech.  05-14-22: Pt with mild sub optimal volume upon entering therapy session, mid 60 dB.  Awareness exhibited of low volume with pt able to correct with min-A from SLP. Pt questions overall purpose of ST. Generated personal goal of being able to project voice during reenactments or sharing a presentation on a revolutionary war story. Pt with questions regarding program execution, SLP provides written instructions answering all questions, models exercises for pt, and provides verbal feedback. Following, SLP leads pt through exercises providing usual model prior to pt execution to aid comprehension and completion. Occasional min to mod-A required to achieve targeted dB this date. Averages this date: loud "ah" 86 dB; counting 82 dB; reading  dB; cognitive speech task 82 dB. Intermittent side comments required usual cues to optimize vocal intensity from mid 60s dB to low 70s dB.  05-10-22: Pt returned with Speak Out! Workbook. Endorsed his need for additional training due to reduced recall since last ST session. Pt also inquired how to determine about appropriate loudness in conversation, in which SLP utilized audio feedback to highlight decay in vocal intensity and clarity short 2 minute speech sample. Pt demonstrated increased sensory awareness of decline in volume and clarity. Re-educated Speak Out! Program principles and rationale to optimize carryover of dysarthria compensations. SLP leads pt through exercises providing usual model prior to pt execution to aid comprehension and completion. Occasional min to mod-A required to achieve targeted dB this date. Averages this date: loud "ah" 88 dB (~5-8 seconds); reading 77 dB; cognitive speech task 75 dB. Intermittent side comments required usual cues to optimize vocal intensity from mid 60s dB to low 70s dB.  04-28-22: Re-educated rationale and principles of Speak Out! Program for hypokinetic dysarthria. Targeted improving vocal quality and increasing intensity through progressively difficulty speech tasks using Speak Out! program, lesson 1. ST leads pt  through exercises providing usual model prior to pt execution. Occasional min to mod-A required to achieve target dB this date. Averages this date: loud "ah" 89 dB (~8 seconds); reading 75 dB; cognitive speech task 75 dB. Intermittent side comments required usual cues to optimize vocal intensity from mid 60s dB to low 70s dB.    PATIENT EDUCATION: Education details: see above Person educated: Patient and Spouse Education method: Explanation, Demonstration, and Handouts Education comprehension: verbalized understanding, returned demonstration, and needs further education     HOME EXERCISE PROGRAM: Order Speak Out! book     GOALS: Goals reviewed with patient? Yes   SHORT TERM GOALS: Target date: 05/19/2022     Pt will complete Speak Out! HEP at least 1x/day given occasional min A over 2 sessions  Baseline: 05-17-22 Goal status: Met   2.  Pt will achieve targeted dB (85-90 dB) on warm up exercises with 80% accuracy given occasional min A over 2 sessions  Baseline:  Goal status: Partially Met   3.  Pt will achieve targeted dB (75-85 dB) on reading exercises with 80% accuracy given occasional min A over 2 sessions Baseline: 05-10-22, 05-17-22 Goal status: Met   4.  Pt will achieve targeted dB (72-78 dB) on cognitive exercises with 80% accuracy given occasional min A over 2 sessions Baseline: 05-10-22, 7-11-07-21 Goal status: Met   5.  Pt will utilize dysarthria compensations in 5-10 minute conversation to optimize vocal intensity and clarity given occasional mod A over 2 sessions  Baseline:  Goal status: Partially Met   6.  Pt will implement memory/attention compensations to aid daily functioning given occasional mod A over 2 sessions  Baseline:  Goal status: Partially Met   LONG TERM GOALS: Target date: 07/16/2022    Pt will complete Speak Out! HEP at least 1x/day (BID recommended) > 1 week  Baseline:  Goal status: ongoing   2.  Pt will achieve targeted dB levels in demonstration  of Speak Out! Lessons with 90% accuracy given rare min A over 2 sessions  Baseline:  Goal status: ongoing   3.  Pt will utilize dysarthria compensations in 15+ minute conversation to optimize vocal intensity and clarity given occasional min A over 2 sessions Baseline:  Goal status: ongoing   4.  Pt will implement memory/attention compensations to aid daily functioning given occasional min A over 2 sessions  Baseline:  Goal status: ongoing   5.  Pt will report improved communication effectiveness via PROM by 2 point improvement by last ST session  Baseline: CPIB=26 Goal status: ongoing   ASSESSMENT:   CLINICAL IMPRESSION: Patient is a 81 y.o. male who was seen today for previous thalamic stroke and Parkinson's disease. 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 Conducted ongoing education and training of Speak Out! Program to optimize vocal intensity and clarity. Skilled ST is warranted to address dysarthria and cognition to maximize communication effectiveness and functional independence.    OBJECTIVE IMPAIRMENTS  Objective impairments include attention, memory, and dysarthria. These impairments are limiting patient from household responsibilities and effectively communicating at home and in community.Factors affecting potential to achieve goals and functional outcome are medical prognosis and previous level of function. Patient will benefit from skilled SLP services to address above impairments and improve overall function.   REHAB POTENTIAL: Good   PLAN: SLP FREQUENCY: 2x/week   SLP DURATION: 12 weeks   PLANNED INTERVENTIONS: Cueing hierachy, Cognitive reorganization, Internal/external aids, Functional tasks, Multimodal communication approach, SLP instruction and feedback, Compensatory strategies, and Patient/family education  Alice Rieger Annye Rusk, CCC-SLP 05/24/2022, 2:53 PM

## 2022-05-24 NOTE — Therapy (Unsigned)
OUTPATIENT OCCUPATIONAL THERAPY [facility]  Patient Name: Jordan [name] MRN: [id number] DOB:Sep 27, 1941, 81 y.o., male Today's Date: 05/25/2022  PCP: Jordan [name] REFERRING PROVIDER: Dr. Ranell [name]   OT End of Session - 05/25/22 1527     Visit Number 7    Number of Visits 24    Date for OT Re-Evaluation 07/15/22    Authorization Type [employer]    Authorization - Visit Number 7    Progress Note Due on Visit 10    OT Start Time 1319    OT Stop Time 1400    OT Time Calculation (min) 41 min    Activity Tolerance Patient tolerated treatment well    Behavior During Therapy [facility] for tasks assessed/performed                  Past Medical History:  Diagnosis Date   Arthritis    R shoulder, bone spur   Arthritis    "hands" (03/25/2016)   Chronic diastolic heart failure (HCC)    Chronic lower back pain    Coronary heart disease    Jordan [name]   H/O cardiovascular stress test    perhaps last one was 2009   Heart murmur    12/29/11 echo: mild MR, no AS, trivial TR   History of gout    Hyperlipidemia    Hypertension    saw Jordan. Linard [name] last earl;y- 2014, cardiac cath. last done ?2009, blocks seen didn't require any intervention at that point.   Narcolepsy    [name] 04-11-97; Mean Latency 1.90mn, SOREM 2   OSA on CPAP    NPSG 12-13-98 AHI 22.7   PONV (postoperative nausea and vomiting)    Presence of permanent cardiac pacemaker    Shortness of breath    with exertion    Stroke ([place]    Type II diabetes mellitus ([place]    Past Surgical History:  Procedure Laterality Date   CARDIAC CATHETERIZATION  2009?   EP IMPLANTABLE DEVICE N/A 03/25/2016   Procedure: Pacemaker Implant - Dual Chamber;  Surgeon: Jordan [name];  [facility];  Service: Cardiovascular;  Laterality: N/A;   FRACTURE SURGERY     INSERT / REPLACE / REMOVE PACEMAKER  03/24/2016   INTRAMEDULLARY (IM) NAIL INTERTROCHANTERIC Left 02/08/2022   Procedure: INTRAMEDULLARY (IM) NAIL  INTERTROCHANTRIC;  Surgeon: DMeredith Pel [name];  [facility]  Service: Orthopedics;  Laterality: Left;   IR KYPHO LUMBAR INC FX REDUCE BONE BX UNI/BIL CANNULATION INC/IMAGING  12/29/2021   IR RADIOLOGIST EVAL & MGMT  12/15/2021   LEFT AND RIGHT HEART CATHETERIZATION WITH CORONARY ANGIOGRAM N/A 06/05/2014   Procedure: LEFT AND RIGHT HEART CATHETERIZATION WITH CORONARY ANGIOGRAM;  Surgeon: Jordan Grooms [name];  [facility];  Service: Cardiovascular;  Laterality: N/A;   NASAL FRACTURE SURGERY     SHOULDER ARTHROSCOPY WITH ROTATOR CUFF REPAIR AND SUBACROMIAL DECOMPRESSION Right 08/16/2013   Procedure: SHOULDER ARTHROSCOPY WITH ROTATOR CUFF REPAIR AND SUBACROMIAL DECOMPRESSION;  Surgeon: Jordan Sells [name];  Location: [facility]  Service: Orthopedics;  Laterality: Right;  Right shoulder arthroscopy rotator cuff repair, subacromial decompression.   TONSILLECTOMY     Patient Active Problem List   Diagnosis Date Noted   Acute deep vein thrombosis (DVT) of left lower extremity ([facility] 04/29/2022   Closed intertrochanteric fracture of left hip, initial encounter ([place]    Closed left hip fracture ([place] secondary to fall at home 02/08/2022   Paroxysmal atrial fibrillation (  [place]) 02/08/2022   Debility 01/08/2022   Constipation 01/07/2022   Physical deconditioning 14/43/1540   Acute metabolic encephalopathy 08/67/6195   Hypomagnesemia 01/04/2022   Pancreatitis 12/30/2021   Parkinson's disease ([place]) 12/21/2021   Parkinson disease ([place]) 12/18/2021   Frequent falls 12/18/2021   History of CVA (cerebrovascular accident) 12/18/2021   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   Hallucinations, visual 07/30/2021   Acetabulum fracture, left ([place]) 06/25/2021   Acute hip pain, left 06/24/2021   Fall at home, initial encounter 06/24/2021   Leukocytosis 06/24/2021   Chronic diastolic heart failure ([place])    Sinusitis, acute maxillary 06/24/2020   AKI (acute kidney injury) ([place])    Labile blood  glucose    Cough    Benign essential HTN    Skin lesion of hand    Hypoalbuminemia due to protein-calorie malnutrition ([place])    Controlled type 2 diabetes mellitus with hyperglycemia, without long-term current use of insulin (HCC)    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   Morbid obesity ([place])    Acute blood loss anemia    Acute thalamic infarction ([place]) 04/15/2020   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[place]) 05/25/2019   TIA (transient ischemic attack) 02/17/2018   Type 2 diabetes mellitus with hyperlipidemia ([place]) 02/17/2018   Insomnia 02/01/2018   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   Chronotropic incompetence 07/09/2014   Dyspnea on exertion 03/03/2014   HLD (hyperlipidemia) 12/13/2007   Essential hypertension 12/13/2007   Class 1 obesity 12/05/2007   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   Coronary atherosclerosis 12/05/2007    ONSET DATE: 04/19/22  REFERRING DIAG: thalamic CVA I63.81  THERAPY DIAG:  Other lack of coordination  Other abnormalities of gait and mobility  Dysarthria and anarthria  Attention and concentration deficit  Frontal lobe and executive function deficit  Abnormal posture  Tremor  Unsteadiness on feet  Rationale for Evaluation and Treatment Rehabilitation  SUBJECTIVE:   SUBJECTIVE STATEMENT: Pt reports consistent back pain Pt accompanied by:  self, wife   PERTINENT HISTORY: Jordan [name] is an 81 year old male with history of CAD/chronic diastolic CHF, OSA, K9TO, gout, Parkinson's diease with recent hospital admission 02/17-02-22-23 worsening of gait disorder with falls with hallucinations which was treated with medication adjustment and he was discharged to home. He was readmitted on 12/30/21 with abdominal pain with low grade fever, leucocytosis, lactic acidosis, mild confusion and lethargy secondary to sepsis. He was found to have elevated lipase-165  with inflammatory changes suggestive of primary  pancreatitis with secondary inflammation of hepatic flexure of colon or primary sigmoid diverticulitis with secondary extension to pancreatic tail. He was treated with IVF and broad spectrum antibiotics.   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 Pt fell and sustained hip fx on 02/08/22 and pt underwent left  IM nailing.  04/30/22:  hospitalized with blood clot LLE--changed meds and cleared to resume OT/PT.   Pt accompanied by: self and significant other, spouse, [name]    PRECAUTIONS: Fall and Other: , hx of back kyphoplasty  , pacemaker, hx of L hip fx, s/p IM nailing, hx of shoulder dislocations, hx of LLE DVT  WEIGHT BEARING RESTRICTIONS No  PAIN:  Are you having pain? No  PLOF: Independent prior to CVA in 2021  PATIENT GOALS to be more independent, pt's wife states he would like to be able to drive  OBJECTIVE:   TODAY'S TREATMENT:  Reviewed  cane exercises in seated, closed chain shoulder flexion, chest press and shoulder abduction, min v.c for postioning, increased time required Fastening buttons with adapted strategies, increased time and min v.c Attempted PPT#4 in standing, however pt did not demonstrate adequate balance so therapist had pt perform donning/ doffing jacket in seated for PPT#4: 2 mins and 49 secs  PATIENT EDUCATION: Education details: cane exercise review Person educated: Patient, wife Education method: Explanation, demonstration, verbal cues, handout Education comprehension: verbalized understanding, returned demonstration, verbal cues   HOME EXERCISE PROGRAM: Coordination 05/10/22:  cane HEP (shoulder flex, chest press, and shoulder abduction); strategies for cutting food and donning shirt 05/17/22:  strategies for donning sock with sock aide, donning pants with reacher, and writing    GOALS: Goals reviewed with patient? No  SHORT TERM GOALS: Target date: 05/14/22--extend to  05/27/22 due to missed visits  I with initial HEP Baseline: Goal status: IN PROGRESS  2.  Pt will verbalize understanding of adapted strategies to maximize safety and I with ADLs/ IADLs (MW:NUUVOZD food) Baseline:  Goal status: IN PROGRESS  3.  Pt will demonstrate improved UE functional use as evidenced by increasing LUE box/ blocks score by 3 blocks. Baseline: RUE 45, LUE 37 Goal status: inital  4.  Pt will perform LB dressing with min A demonstrating good safety awareness. Baseline: mod-max A Goal status: IN PROGRESS  5.  Pt will consistently donn pullover shirt mod I in a reasonable amount of time Baseline: increased time required and prn min assist Goal status: IN PROGRESS  6.   Pt will verbalize understanding of ways to prevent future PD related complications and PD community resources. Goal status: INITIAL  7. Pt will retrieve items from overhead shelf at 95* with right UE with -15 elbow extension Baseline: 95, -20 Goal status: INITIAL  8. Pt will retrieve items from overhead shelf at 110 with right UE with -10 elbow extension Baseline: 110, -15 Goal status: INITIAL  LONG TERM GOALS: Target date: 07/15/2022    Pt will demonstrate understanding of memory compensations and ways to keep thinking skills sharp  Goal status: INITIAL  2.  Pt will demonstrate improved fine motor coordination for ADLs as evidenced by decreasing 9 hole peg test score for bilateral UE's by 3 secs Baseline: RUE 42.47, LUE 42.25 Goal status: INITIAL  3.  Pt will demonstrate improved ease with feeding as evidenced by decreasing PPT#2 (self feeding) by 3 secs Baseline: 16.66 secs Goal status: INITIAL  4.  Pt will demonstrate improved ease with fastening buttons as evidenced by decreasing 3 button/ unbutton time to : 95 secs Baseline: 1 min 45 secs Goal status: INITIAL  5.  Pt will write a short paragraph with 100% legibility and no significant decrease in letter size Baseline: mild  micrographia Goal status: INITIAL  6.  Pt will donn/ doff jacket in 2 mins and 30 secs or less in seated. Goal status: INITIAL   ASSESSMENT:  CLINICAL IMPRESSION: Pt is progressing slowly towards goals. He remains limited by abnormal posture, balance, cognitive deficits and decreased coordination.[name]  PERFORMANCE DEFICITS in functional skills including ADLs, IADLs, coordination, dexterity, sensation, ROM, strength, pain, flexibility, FMC, GMC, mobility, balance, endurance, decreased knowledge of precautions, decreased knowledge of use of DME, UE functional use, and bradykinesia, cognitive skills including attention, learn, memory, problem solving, safety awareness, and thought, and psychosocial skills including coping strategies, environmental adaptation, habits, interpersonal interactions, and routines and behaviors.   IMPAIRMENTS are limiting patient from ADLs, IADLs, rest and sleep,  play, leisure, and social participation.   COMORBIDITIES may have co-morbidities  that affects occupational performance. Patient will benefit from skilled OT to address above impairments and improve overall function.  MODIFICATION OR ASSISTANCE TO COMPLETE EVALUATION: Min-Moderate modification of tasks or assist with assess necessary to complete an evaluation.  OT OCCUPATIONAL PROFILE AND HISTORY: Detailed assessment: Review of records and additional review of physical, cognitive, psychosocial history related to current functional performance.  CLINICAL DECISION MAKING: Moderate - several treatment options, min-mod task modification necessary  REHAB POTENTIAL: Good  EVALUATION COMPLEXITY: Moderate       PLAN: OT FREQUENCY: 2x/week  OT DURATION: 12 weeks plus eval  PLANNED INTERVENTIONS: self care/ADL training, therapeutic exercise, therapeutic activity, neuromuscular re-education, manual therapy, passive range of motion, balance training, functional mobility training, aquatic therapy, ultrasound,  paraffin, fluidotherapy, moist heat, cryotherapy, contrast bath, patient/family education, cognitive remediation/compensation, visual/perceptual remediation/compensation, energy conservation, coping strategies training, and DME and/or AE instructions  RECOMMENDED OTHER SERVICES: n/a  CONSULTED AND AGREED WITH PLAN OF CARE: Patient and family member/caregiver  PLAN FOR NEXT SESSION:   ADL/big movement strategies, coordination, functional reach,    Danel Studzinski, OTR/L 05/25/2022, 3:29 PM  [phone] Phone: [phone] 3:29 PM 05/25/22

## 2022-05-24 NOTE — Therapy (Signed)
OUTPATIENT PHYSICAL THERAPY TREATMENT NOTE   Patient Name: Jordan [name] MRN: [id number] DOB:1941-06-18, 81 y.o., male Today's Date: 05/24/2022  PCP: [name], MD REFERRING PROVIDER: [name], MD  END OF SESSION:   PT End of Session - 05/24/22 1234     Visit Number 6    Number of Visits 17    Date for PT Re-Evaluation 07/21/22    Authorization Type [employer] Medicare    PT Start Time 1230    PT Stop Time 1315    PT Time Calculation (min) 45 min    Equipment Utilized During Treatment Gait belt    Activity Tolerance Patient tolerated treatment well    Behavior During Therapy WFL for tasks assessed/performed               Past Medical History:  Diagnosis Date   Arthritis    R shoulder, bone spur   Arthritis    "hands" (03/25/2016)   Chronic diastolic heart failure (HCC)    Chronic lower back pain    Coronary heart disease    Dr Pernell Dupre   H/O cardiovascular stress test    perhaps last one was 2009   Heart murmur    12/29/11 echo: mild MR, no AS, trivial TR   History of gout    Hyperlipidemia    Hypertension    saw Dr. Linard Millers last earl;y- 2014, cardiac cath. last done ?2009, blocks seen didn't require any intervention at that point.   Narcolepsy    [name] 04-11-97; Mean Latency 1.70mn, SOREM 2   OSA on CPAP    NPSG 12-13-98 AHI 22.7   PONV (postoperative nausea and vomiting)    Presence of permanent cardiac pacemaker    Shortness of breath    with exertion    Stroke ([place]    Type II diabetes mellitus ([place]    Past Surgical History:  Procedure Laterality Date   CARDIAC CATHETERIZATION  2009?   EP IMPLANTABLE DEVICE N/A 03/25/2016   Procedure: Pacemaker Implant - Dual Chamber;  Surgeon: GEvans Lance MD;  [facility];  Service: Cardiovascular;  Laterality: N/A;   FRACTURE SURGERY     INSERT / REPLACE / REMOVE PACEMAKER  03/24/2016   INTRAMEDULLARY (IM) NAIL INTERTROCHANTERIC Left 02/08/2022   Procedure: INTRAMEDULLARY (IM) NAIL  INTERTROCHANTRIC;  Surgeon: DMeredith Pel MD;  [facility]: [facility]  Service: Orthopedics;  Laterality: Left;   IR KYPHO LUMBAR INC FX REDUCE BONE BX UNI/BIL CANNULATION INC/IMAGING  12/29/2021   IR RADIOLOGIST EVAL & MGMT  12/15/2021   LEFT AND RIGHT HEART CATHETERIZATION WITH CORONARY ANGIOGRAM N/A 06/05/2014   Procedure: LEFT AND RIGHT HEART CATHETERIZATION WITH CORONARY ANGIOGRAM;  Surgeon: HSinclair Grooms MD;  [facility];  Service: Cardiovascular;  Laterality: N/A;   NASAL FRACTURE SURGERY     SHOULDER ARTHROSCOPY WITH ROTATOR CUFF REPAIR AND SUBACROMIAL DECOMPRESSION Right 08/16/2013   Procedure: SHOULDER ARTHROSCOPY WITH ROTATOR CUFF REPAIR AND SUBACROMIAL DECOMPRESSION;  Surgeon: JNita Sells MD;  [facility]: [facility]  Service: Orthopedics;  Laterality: Right;  Right shoulder arthroscopy rotator cuff repair, subacromial decompression.   TONSILLECTOMY     Patient Active Problem List   Diagnosis Date Noted   Acute deep vein thrombosis (DVT) of left lower extremity ([facility] 04/29/2022   Closed intertrochanteric fracture of left hip, initial encounter ([place]    Closed left hip fracture ([facility] secondary to fall at home 02/08/2022   Paroxysmal atrial fibrillation ([facility] 02/08/2022  Debility 01/08/2022   Constipation 01/07/2022   Physical deconditioning 56/97/9480   Acute metabolic encephalopathy 16/55/3748   Hypomagnesemia 01/04/2022   Pancreatitis 12/30/2021   Parkinson's disease ([place]) 12/21/2021   Parkinson disease ([place]) 12/18/2021   Frequent falls 12/18/2021   History of CVA (cerebrovascular accident) 12/18/2021   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   Hallucinations, visual 07/30/2021   Acetabulum fracture, left ([place]) 06/25/2021   Acute hip pain, left 06/24/2021   Fall at home, initial encounter 06/24/2021   Leukocytosis 06/24/2021   Chronic diastolic heart failure ([place])    Sinusitis, acute maxillary 06/24/2020   AKI (acute kidney injury) ([place])    Labile blood  glucose    Cough    Benign essential HTN    Skin lesion of hand    Hypoalbuminemia due to protein-calorie malnutrition ([place])    Controlled type 2 diabetes mellitus with hyperglycemia, without long-term current use of insulin ([place])    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   Morbid obesity ([place])    Acute blood loss anemia    Acute thalamic infarction ([place]) 04/15/2020   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[place]) 05/25/2019   TIA (transient ischemic attack) 02/17/2018   Type 2 diabetes mellitus with hyperlipidemia ([place]) 02/17/2018   Insomnia 02/01/2018   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   Chronotropic incompetence 07/09/2014   Dyspnea on exertion 03/03/2014   HLD (hyperlipidemia) 12/13/2007   Essential hypertension 12/13/2007   Class 1 obesity 12/05/2007   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   Coronary atherosclerosis 12/05/2007    REFERRING DIAG: I63.81 (ICD-10-CM) - 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[place]) G20 (ICD-10-CM) - Parkinson's disease ([place]) S72.145S (ICD-10-CM) - Type III open nondisplaced intertrochanteric fracture of left femur, sequela    THERAPY DIAG:  Muscle weakness (generalized)  Other lack of coordination  Other abnormalities of gait and mobility  Rationale for Evaluation and Treatment Rehabilitation  PERTINENT HISTORY: PMHx: PD (diagnosed Jan 2022), HLD, OSA, DM, HTN, chronic diastolic HF, CVA, frequent falls, L2 compression fx, L hip fx   PRECAUTIONS: Fall and ICD/Pacemaker, history of compression fracture at L2 with recent kyphoplasty   SUBJECTIVE: Pt reports having a "weird weekend". States he is having difficulty working through [employer] and he slept all day yesterday, "I did not have a game to keep me up". Pt states he has had recurring abdominal pain that resolves slightly w/Tylenol, is working through kidney stones as well. States he has had no dizziness since his BPPV treatment last week.    VITALS Vitals:   05/24/22 1244 05/24/22  1311  BP: (!) 106/59 125/65  Pulse: 73 73     PAIN:  Are you having pain? No  OBJECTIVE  TODAY'S TREATMENT:   Ther Ex  NuStep workload 2 for 4 minutes and workload 4 for 4 minutes using BLEs only for dynamic cardiovascular conditioning, BLE strength and endurance. Min cues to maintain RPE of 6-7/10, "how am I supposed to be a 6 when this thing is a 4". Mod verbal cues throughout to improve pt's selective attention to task. RPE of 2/10 following activity   In // bars for improved lateral weightshifting, single leg stability and hip flexor strength:  -Alt toe taps to 4" step w/BUE support, 2x10 per side with seated rest break taken in between sets 2/2 fatigue. Noted increased difficulty tapping w/ RLE > LLE, pt stated he could "feel his joint cracking" throughout.   Gait pattern: step to pattern, decreased step length- Right, decreased step length- Left, decreased stride length, decreased  hip/knee flexion- Right, decreased hip/knee flexion- Left, shuffling, trunk flexed, poor foot clearance- Right, and poor foot clearance- Left Distance walked: Various clinic distances  Assistive device utilized: Walker - 2 wheeled Level of assistance: CGA and Min A Comments: Pt demonstrated significant over-reliance on BUEs during gait, mod cues for upright posture, in which pt replied "yea, yea".      PATIENT EDUCATION: Education details: Continue HEP Person educated: Patient and Spouse Education method: Explanation Education comprehension: verbalized understanding     HOME EXERCISE PROGRAM: Access Code: [email or web address] URL: [email or web address] Date: 04/23/2022 Prepared by: Estevan Ryder  Exercises - Clamshell  - 1 x daily - 7 x weekly - 3 sets - 10 reps - Seated Scapular Retraction  - 1 x daily - 7 x weekly - 3 sets - 10 reps - Seated Long Arc Quad  - 1 x daily - 7 x weekly - 3 sets - 10 reps - Seated Hamstring Stretch  - 1 x daily - 7 x weekly - 3 sets - 10 reps - Sit to Stand   - 1 x daily - 7 x weekly - 3 sets - 10 reps - Seated Heel Raise  - 1 x daily - 7 x weekly - 3 sets - 10 reps - Seated March  - 1 x daily - 7 x weekly - 3 sets - 10 reps - Seated Flexion Stretch with Swiss Ball  - 1 x daily - 7 x weekly - 3 sets - 10 reps          GOALS: Goals reviewed with patient? Yes   SHORT TERM GOALS: Target date: 05/19/2022   Pt will be able to perform initial HEP for strengthening and balance to continue gains with supervision of wife. Baseline: Goal status: MET   2.  BERG to be assessed with STG/LTG written.  Baseline: 20/56 Goal status: MET   3.  Pt will improve 5x sit<>stand to less than or equal to 19 sec without UE support to demonstrate improved functional strength and transfer efficiency.   Baseline: 22.6 seconds without UE support from chair; 21seconds without UE support  Goal status: NOT MET   4.  Pt will ambulate >350' with RW mod I for improved household and short community distances. Baseline: requires CGA/MinA/ limited to 230' Goal status: NOT MET   5.  Pt will improve gait speed with RW to at least 1.3 ft/sec in order to demo improved community mobility.    Baseline:  .87 ft/sec with RW; .47f/s (0.333m) Goal status: NOT MET       LONG TERM GOALS: Target date: 06/16/2022     Pt will be independent with final HEP for strength,gait, balance in order to build upon functional gains made in therapy.   Baseline:  Goal status: INITIAL   2.  Patient will score >25/56 on Berg to demonstrate improved balance Baseline: 20/56 Goal status: REVISED   3.  Pt will improve TUG time with RW vs. LRAD to 26 seconds or less in order to demo decrease fall risk.   Baseline: 33.32 seconds with RW  Goal status: INITIAL   4.  Pt will improve gait speed with RW vs. LRAD to at least 1.9 ft/sec in order to demo improved community mobility.  Baseline: .87 ft/sec with RW Goal status: INITIAL   5.  Pt will ambulate >350' with RW versus LRAD on paved  surfaces with supervision for improved community mobility. Baseline: 230' Goal status: REVISED     ASSESSMENT:  CLINICAL IMPRESSION: Emphasis of skilled session on endurance, BLE strength and lateral weight shifting. Pt required mod verbal cues to maintain selective attention to task throughout session. Pt's BP low at beginning of session but did improve w/activity, pt asymptomatic. Pt continues to be limited by LLE weakness, impaired memory and decreased endurance. Continue POC.     OBJECTIVE IMPAIRMENTS Abnormal gait, decreased activity tolerance, decreased balance, decreased coordination, decreased endurance, decreased knowledge of use of DME, decreased mobility, decreased ROM, decreased strength, impaired flexibility, postural dysfunction, and pain.    ACTIVITY LIMITATIONS cleaning, community activity, driving, meal prep, laundry, yard work, and shopping.    PERSONAL FACTORS Age, Past/current experiences, Time since onset of injury/illness/exacerbation, and 3+ comorbidities:  PD, HLD, OSA, DM, HTN, chronic diastolic HF, CVA, frequent falls, L2 compression fx, L hip fx   are also affecting patient's functional outcome.      REHAB POTENTIAL: Good   CLINICAL DECISION MAKING: Evolving/moderate complexity   EVALUATION COMPLEXITY: Moderate   PLAN: PT FREQUENCY: 2x/week   PT DURATION: 12 weeks, plus eval   PLANNED INTERVENTIONS: Therapeutic exercises, Therapeutic activity, Neuromuscular re-education, Balance training, Gait training, Patient/Family education, Joint mobilization, Stair training, Vestibular training, Canalith repositioning, DME instructions, and Manual therapy   PLAN FOR NEXT SESSION: BPPV assessment, posture, functional strength/endurance, add balance to HEP, gait training, weight shifting to L  Yasin Ducat E Skyler Carel, PT, DPT 05/24/2022, 1:18 PM

## 2022-05-26 NOTE — Therapy (Signed)
OUTPATIENT OCCUPATIONAL THERAPY [facility]  Patient Name: Jordan [name] MRN: [id number] DOB:February 25, 1941, 81 y.o., male Today's Date: 05/27/2022  PCP: Dr. Kenton [name] REFERRING PROVIDER: Dr. Ranell [name]   OT End of Session - 05/27/22 1330     Visit Number 8    Number of Visits 24    Date for OT Re-Evaluation 07/15/22    Authorization Type [employer]    Authorization Time Period met OOP max  VL:MN  Follow Medicare Guidelines    Authorization - Visit Number 8    Authorization - Number of Visits 10    Progress Note Due on Visit 10    OT Start Time 1321    OT Stop Time 1402   pt had to go to restroom during session   OT Time Calculation (min) 41 min    Activity Tolerance Patient tolerated treatment well    Behavior During Therapy Jordan [name] for tasks assessed/performed                   Past Medical History:  Diagnosis Date   Arthritis    R shoulder, bone spur   Arthritis    "hands" (03/25/2016)   Chronic diastolic heart failure (HCC)    Chronic lower back pain    Coronary heart disease    Dr Jordan [name]   H/O cardiovascular stress test    perhaps last one was 2009   Heart murmur    12/29/11 echo: mild MR, no AS, trivial TR   History of gout    Hyperlipidemia    Hypertension    saw Dr. Linard [name] last earl;y- 2014, cardiac cath. last done ?2009, blocks seen didn't require any intervention at that point.   Narcolepsy    [name] 04-11-97; Mean Latency 1.72mn, SOREM 2   OSA on CPAP    NPSG 12-13-98 AHI 22.7   PONV (postoperative nausea and vomiting)    Presence of permanent cardiac pacemaker    Shortness of breath    with exertion    St[place] ([place]    Type II diabetes mellitus ([place]    Past Surgical History:  Procedure Laterality Date   CARDIAC CATHETERIZATION  2009?   EP IMPLANTABLE DEVICE N/A 03/25/2016   Procedure: Pacemaker Implant - Dual Chamber;  Surgeon: Jordan Lance MD;  [facility];  Service: Cardiovascular;  Laterality: N/A;   FRACTURE  SURGERY     INSERT / REPLACE / REMOVE PACEMAKER  03/24/2016   INTRAMEDULLARY (IM) NAIL INTERTROCHANTERIC Left 02/08/2022   Procedure: INTRAMEDULLARY (IM) NAIL INTERTROCHANTRIC;  Surgeon: Jordan Pel MD;  [facility]  Service: Orthopedics;  Laterality: Left;   IR KYPHO LUMBAR INC FX REDUCE BONE BX UNI/BIL CANNULATION INC/IMAGING  12/29/2021   IR RADIOLOGIST EVAL & MGMT  12/15/2021   LEFT AND RIGHT HEART CATHETERIZATION WITH CORONARY ANGIOGRAM N/A 06/05/2014   Procedure: LEFT AND RIGHT HEART CATHETERIZATION WITH CORONARY ANGIOGRAM;  Surgeon: HSinclair Grooms MD;  [facility];  Service: Cardiovascular;  Laterality: N/A;   NASAL FRACTURE SURGERY     SHOULDER ARTHROSCOPY WITH ROTATOR CUFF REPAIR AND SUBACROMIAL DECOMPRESSION Right 08/16/2013   Procedure: SHOULDER ARTHROSCOPY WITH ROTATOR CUFF REPAIR AND SUBACROMIAL DECOMPRESSION;  Surgeon: JNita Sells MD;  [facility]  Service: Orthopedics;  Laterality: Right;  Right shoulder arthroscopy rotator cuff repair, subacromial decompression.   TONSILLECTOMY     Patient Active Problem List   Diagnosis Date Noted   Acute deep vein thrombosis (DVT) of left  lower extremity ([place]) 04/29/2022   Closed intertrochanteric fracture of left hip, initial encounter [facility])    Closed left hip fracture ([name]) secondary to fall at home 02/08/2022   Paroxysmal atrial fibrillation ([place]) 02/08/2022   Debility 01/08/2022   Constipation 01/07/2022   Physical deconditioning 17/79/3903   Acute metabolic encephalopathy 00/92/3300   Hypomagnesemia 01/04/2022   Pancreatitis 12/30/2021   Parkinson's disease (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   Hallucinations, visual 07/30/2021   Acetabulum fracture, left ([place]) 06/25/2021   Acute hip pain, left 06/24/2021   Fall at home, initial encounter 06/24/2021    Leukocytosis 06/24/2021   Chronic diastolic heart failure (HCC)    Sinusitis, acute maxillary 06/24/2020   AKI (acute kidney injury) ([place])    Labile blood glucose    Cough    Benign essential HTN    Skin lesion of hand    Hypoalbuminemia due to protein-calorie malnutrition ([place])    Controlled type 2 diabetes mellitus with hyperglycemia, without long-term current use of insulin (HCC)    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   Morbid obesity ([place])    Acute blood loss anemia    Acute thalamic infarction ([place]) 04/15/2020   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[place]) 05/25/2019   TIA (transient ischemic attack) 02/17/2018   Type 2 diabetes mellitus with hyperlipidemia ([place]) 02/17/2018   Insomnia 02/01/2018   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   Chronotropic incompetence 07/09/2014   Dyspnea on exertion 03/03/2014   HLD (hyperlipidemia) 12/13/2007   Essential hypertension 12/13/2007   Class 1 obesity 12/05/2007   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   Coronary atherosclerosis 12/05/2007    ONSET DATE: 04/19/22  REFERRING DIAG: thalamic CVA I63.81  THERAPY DIAG:  Other lack of coordination  Other abnormalities of gait and mobility  Attention and concentration deficit  Frontal lobe and executive function deficit  Abnormal posture  Tremor  Unsteadiness on feet  Rationale for Evaluation and Treatment Rehabilitation  SUBJECTIVE:   SUBJECTIVE STATEMENT: "That really worked my middle" --standing and reaching  Pt accompanied by:  self, wife   PERTINENT HISTORY: Jordan [name] is an 81 year old male with history of CAD/chronic diastolic CHF, OSA, T6AU, gout, Parkinson's diease with recent hospital admission 02/17-02-22-23 worsening of gait disorder with falls with hallucinations which was treated with medication adjustment and he was discharged to home. He was readmitted on 12/30/21 with abdominal pain with low grade fever, leucocytosis, lactic  acidosis, mild confusion and lethargy secondary to sepsis. He was found to have elevated lipase-165  with inflammatory changes suggestive of primary pancreatitis with secondary inflammation of hepatic flexure of colon or primary sigmoid diverticulitis with secondary extension to pancreatic tail. He was treated with IVF and broad spectrum antibiotics.   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 Pt fell and sustained hip fx on 02/08/22 and pt underwent left  IM nailing.  04/30/22:  hospitalized with blood clot LLE--changed meds and cleared to resume OT/PT.   Pt accompanied by: self and significant other, spouse, [name]    PRECAUTIONS: Fall and Other: , hx of back kyphoplasty  , pacemaker, hx of L hip fx, s/p IM nailing, hx of shoulder dislocations, hx of LLE DVT  WEIGHT BEARING RESTRICTIONS No  PAIN:  Are you having pain? No  PLOF: Independent prior to CVA in 2021  PATIENT GOALS to be more independent, pt's wife states he would like to be able to drive  OBJECTIVE:   TODAY'S TREATMENT:   Cane exercises in seated, closed chain shoulder flexion, chest press and shoulder abduction, min v.c and visual cues for postioning/posture, large amplitude movements, head/eye movements.  Per pt questions, discussed benefit of exercises and why pt is experiencing shoulder limitations (posture, forward lean on walker, hx of shoulder injuries, hx of CVA as well as PD).  Standing, functional reaching to place clothespins on vertical pole with each UE with CGA and min-mod verbal and tactile cueing for posture (1 UE support on table).  Then sitting to remove for high reach with each UE with min cueing for large amplitude movements and to alternate UEs.  Checked UE ROM and box and blocks test--see STGs section.  Simulated eating to scoop dried beans and place in elevated bowl with min cueing for incr wrist movement and holding spoon in  middle vs. End.  Per wife questions, recommended against weighted utensils due to bradykinesia and tremors not interfering with eating, but discussed that built-up grips may be beneficial.  **Wife assisted pt to restroom during session   PATIENT EDUCATION: Education details: cane exercise review Person educated: Patient, wife Education method: Explanation, demonstration, verbal cues Education comprehension: verbalized understanding, returned demonstration, verbal cues   HOME EXERCISE PROGRAM: Coordination 05/10/22:  cane HEP (shoulder flex, chest press, and shoulder abduction); strategies for cutting food and donning shirt 05/17/22:  strategies for donning sock with sock aide, donning pants with reacher, and writing    GOALS: Goals reviewed with patient? No  SHORT TERM GOALS: Target date: 05/14/22--extend to 05/27/22 due to missed visits  I with initial HEP Baseline: Goal status: IN PROGRESS  2.  Pt will verbalize understanding of adapted strategies to maximize safety and I with ADLs/ IADLs (RU:EAVWUJW food) Baseline:  Goal status: IN PROGRESS  3.  Pt will demonstrate improved UE functional use as evidenced by increasing LUE box/ blocks score by 3 blocks. Baseline: RUE 45, LUE 37 Goal status: 05/27/22:  ONGOING R-37blocks, L-39 blocks   4.  Pt will perform LB dressing with min A demonstrating good safety awareness. Baseline: mod-max A Goal status: IN PROGRESS  5.  Pt will consistently donn pullover shirt mod I in a reasonable amount of time Baseline: increased time required and prn min assist Goal status: IN PROGRESS  6.   Pt will verbalize understanding of ways to prevent future PD related complications and PD community resources. Goal status: IN PROGRESS  7. Pt will retrieve items from overhead shelf at 95* with right UE with -15 elbow extension Baseline: 95, -20 Goal status: PARTIALLY MET.  115* with -20* elbow ext.  8. Pt will retrieve items from overhead shelf at  110 with left UE with -10 elbow extension Baseline: 110, -15 Goal status: Partially MET 05/27/22:  L-125* -15* elbow ext  LONG TERM GOALS: Target date: 07/15/2022    Pt will demonstrate understanding of memory compensations and ways to keep thinking skills sharp  Goal status: INITIAL  2.  Pt will demonstrate improved fine motor coordination for ADLs as evidenced by decreasing 9 hole peg test score for bilateral UE's by 3 secs Baseline: RUE 42.47, LUE 42.25 Goal status: INITIAL  3.  Pt will demonstrate improved ease with feeding as evidenced by decreasing PPT#2 (self feeding) by 3 secs Baseline: 16.66 secs Goal status: INITIAL  4.  Pt  will demonstrate improved ease with fastening buttons as evidenced by decreasing 3 button/ unbutton time to : 95 secs Baseline: 1 min 45 secs Goal status: INITIAL  5.  Pt will write a short paragraph with 100% legibility and no significant decrease in letter size Baseline: mild micrographia Goal status: INITIAL  6.  Pt will donn/ doff jacket in 2 mins and 30 secs or less in seated. Goal status: INITIAL   ASSESSMENT:  CLINICAL IMPRESSION: Pt is progressing slowly towards goals. He remains limited by abnormal posture, balance, cognitive deficits and decreased coordination/bradykinesia.  Pt demo improved bilateral shoulder ROM today.  PERFORMANCE DEFICITS in functional skills including ADLs, IADLs, coordination, dexterity, sensation, ROM, strength, pain, flexibility, FMC, GMC, mobility, balance, endurance, decreased knowledge of precautions, decreased knowledge of use of DME, UE functional use, and bradykinesia, cognitive skills including attention, learn, memory, problem solving, safety awareness, and thought, and psychosocial skills including coping strategies, environmental adaptation, habits, interpersonal interactions, and routines and behaviors.   IMPAIRMENTS are limiting patient from ADLs, IADLs, rest and sleep, play, leisure, and social  participation.   COMORBIDITIES may have co-morbidities  that affects occupational performance. Patient will benefit from skilled OT to address above impairments and improve overall function.  MODIFICATION OR ASSISTANCE TO COMPLETE EVALUATION: Min-Moderate modification of tasks or assist with assess necessary to complete an evaluation.  OT OCCUPATIONAL PROFILE AND HISTORY: Detailed assessment: Review of records and additional review of physical, cognitive, psychosocial history related to current functional performance.  CLINICAL DECISION MAKING: Moderate - several treatment options, min-mod task modification necessary  REHAB POTENTIAL: Good  EVALUATION COMPLEXITY: Moderate    PLAN: OT FREQUENCY: 2x/week  OT DURATION: 12 weeks plus eval  PLANNED INTERVENTIONS: self care/ADL training, therapeutic exercise, therapeutic activity, neuromuscular re-education, manual therapy, passive range of motion, balance training, functional mobility training, aquatic therapy, ultrasound, paraffin, fluidotherapy, moist heat, cryotherapy, contrast bath, patient/family education, cognitive remediation/compensation, visual/perceptual remediation/compensation, energy conservation, coping strategies training, and DME and/or AE instructions  RECOMMENDED OTHER SERVICES: n/a  CONSULTED AND AGREED WITH PLAN OF CARE: Patient and family member/caregiver  PLAN FOR NEXT SESSION:   check STGs #4-5--Practice LB dressing with reacher and theraband loop, use of sock aide, and check on UB dressing/review strategy--head first and maintain upright posture;  ADL/big movement strategies, coordination, functional reach   [facility] [name], OTR/L 05/27/2022, 2:30 PM  [phone] Phone: [phone] 2:30 PM 05/27/22

## 2022-05-26 NOTE — Therapy (Unsigned)
OUTPATIENT SPEECH LANGUAGE PATHOLOGY TREATMENT NOTE   Patient Name: Jordan [name] MRN: [id number] DOB:Nov 09, 1940, 81 y.o., male Today's Date: 05/27/2022  PCP: [name] MD REFERRING PROVIDER: Izora Ribas, MD   END OF SESSION:   End of Session - 05/27/22 1230     Visit Number 8    Number of Visits 25    Date for SLP Re-Evaluation 07/16/22    Authorization Type [employer] Medicare  $20 co pay per day    SLP Start Time 42    SLP Stop Time  1315    SLP Time Calculation (min) 45 min    Activity Tolerance Patient tolerated treatment well                Past Medical History:  Diagnosis Date   Arthritis    R shoulder, bone spur   Arthritis    "hands" (03/25/2016)   Chronic diastolic heart failure (HCC)    Chronic lower back pain    Coronary heart disease    Dr Pernell Dupre   H/O cardiovascular stress test    perhaps last one was 2009   Heart murmur    12/29/11 echo: mild MR, no AS, trivial TR   History of gout    Hyperlipidemia    Hypertension    saw Dr. Linard Millers last earl;y- 2014, cardiac cath. last done ?2009, blocks seen didn't require any intervention at that point.   Narcolepsy    [name] 04-11-97; Mean Latency 1.95mn, SOREM 2   OSA on CPAP    NPSG 12-13-98 AHI 22.7   PONV (postoperative nausea and vomiting)    Presence of permanent cardiac pacemaker    Shortness of breath    with exertion    Stroke ([place]    Type II diabetes mellitus ([place]    Past Surgical History:  Procedure Laterality Date   CARDIAC CATHETERIZATION  2009?   EP IMPLANTABLE DEVICE N/A 03/25/2016   Procedure: Pacemaker Implant - Dual Chamber;  Surgeon: GEvans Lance MD;  [facility];  Service: Cardiovascular;  Laterality: N/A;   FRACTURE SURGERY     INSERT / REPLACE / REMOVE PACEMAKER  03/24/2016   INTRAMEDULLARY (IM) NAIL INTERTROCHANTERIC Left 02/08/2022   Procedure: INTRAMEDULLARY (IM) NAIL INTERTROCHANTRIC;  Surgeon: DMeredith Pel MD;  [place] [facility]   Service: Orthopedics;  Laterality: Left;   IR KYPHO LUMBAR INC FX REDUCE BONE BX UNI/BIL CANNULATION INC/IMAGING  12/29/2021   IR RADIOLOGIST EVAL & MGMT  12/15/2021   LEFT AND RIGHT HEART CATHETERIZATION WITH CORONARY ANGIOGRAM N/A 06/05/2014   Procedure: LEFT AND RIGHT HEART CATHETERIZATION WITH CORONARY ANGIOGRAM;  Surgeon: HSinclair Grooms MD;  [facility];  Service: Cardiovascular;  Laterality: N/A;   NASAL FRACTURE SURGERY     SHOULDER ARTHROSCOPY WITH ROTATOR CUFF REPAIR AND SUBACROMIAL DECOMPRESSION Right 08/16/2013   Procedure: SHOULDER ARTHROSCOPY WITH ROTATOR CUFF REPAIR AND SUBACROMIAL DECOMPRESSION;  Surgeon: JNita Sells MD;  [place] [facility]  Service: Orthopedics;  Laterality: Right;  Right shoulder arthroscopy rotator cuff repair, subacromial decompression.   TONSILLECTOMY     Patient Active Problem List   Diagnosis Date Noted   Acute deep vein thrombosis (DVT) of left lower extremity ([place] 04/29/2022   Closed intertrochanteric fracture of left hip, [facility]    Closed left hip fracture ([place] secondary to fall at home 02/08/2022   Paroxysmal atrial fibrillation ([place] 02/08/2022   Debility 01/08/2022   Constipation 01/07/2022   Physical  deconditioning 03/70/4888   Acute metabolic encephalopathy 91/69/4503   Hypomagnesemia 01/04/2022   Pancreatitis 12/30/2021   Parkinson's disease ([place]) 12/21/2021   Parkinson disease ([place]) 12/18/2021   Frequent falls 12/18/2021   History of CVA (cerebrovascular accident) 12/18/2021   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   Hallucinations, visual 07/30/2021   Acetabulum fracture, left ([place]) 06/25/2021   Acute hip pain, left 06/24/2021   Fall at home, initial encounter 06/24/2021   Leukocytosis 06/24/2021   Chronic diastolic heart failure ([place])    Sinusitis, acute maxillary 06/24/2020   AKI (acute kidney injury) ([place])    Labile blood glucose    Cough    Benign essential HTN    Skin lesion of hand     Hypoalbuminemia due to protein-calorie malnutrition ([place])    Controlled type 2 diabetes mellitus with hyperglycemia, without long-term current use of insulin (HCC)    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   Morbid obesity ([place])    Acute blood loss anemia    Acute thalamic infarction ([place]) 04/15/2020   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[place]) 05/25/2019   TIA (transient ischemic attack) 02/17/2018   Type 2 diabetes mellitus with hyperlipidemia ([place]) 02/17/2018   Insomnia 02/01/2018   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   Chronotropic incompetence 07/09/2014   Dyspnea on exertion 03/03/2014   HLD (hyperlipidemia) 12/13/2007   Essential hypertension 12/13/2007   Class 1 obesity 12/05/2007   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 No  OBJECTIVE:     TODAY'S TREATMENT:   05-27-22: Reports "slow" progress on speech exercise. Continues to present with some questions regarding structure and purpose of targeted exercises. SLP answers all questions and provides usual models of all exercises to aid in successful completion of tasks. Usual mod-A required for accurate completion of tasks and to meet listed average dBs. Loud ah: 88 dB; counting 76 dB; reading paragraphs 74 dB; cognitive task 69 dB. Some intermittent hoarseness observed which was able to clear with mod-A for intentional speech.   05-24-22: Pt enters with low 70's to mid 60's dB. Targeted improving vocal quality and increasing intensity through progressively difficulty  speech tasks using Speak Out! program, lesson 11. ST leads pt through exercises providing frequent model prior to pt execution. Usual mod-A required to achieve target dB this date. Averages this date: loud "ah" 90 dB; reading 84 dB; cognitive speech task 73 dB. Effective pausing to formulate thoughts exhibited. Conversational sample of approx 3 minutes, pt averaged upper 67 dB with occasional to usual in-A to utilize intentional speech. He required clarification that sustained loud /a/ should be consistent pitch as he was gradually increasing pitch during /a/. Spouse present. Modeled cueing and instructed her the exercises should be done twice a day. Showed where to access online practice.  PATIENT EDUCATION: Education details: see above Person educated: Patient and Spouse Education method: Explanation, Demonstration, and Handouts Education comprehension: verbalized understanding, returned demonstration, and needs further education     HOME EXERCISE PROGRAM: Speak Out daily     GOALS: Goals reviewed with patient? Yes   SHORT TERM GOALS: Target date: 05/19/2022     Pt  will complete Speak Out! HEP at least 1x/day given occasional min A over 2 sessions  Baseline: 05-17-22 Goal status: Met   2.  Pt will achieve targeted dB (85-90 dB) on warm up exercises with 80% accuracy given occasional min A over 2 sessions  Baseline:  Goal status: Partially Met   3.  Pt will achieve targeted dB (75-85 dB) on reading exercises with 80% accuracy given occasional min A over 2 sessions Baseline: 05-10-22, 05-17-22 Goal status: Met   4.  Pt will achieve targeted dB (72-78 dB) on cognitive exercises with 80% accuracy given occasional min A over 2 sessions Baseline: 05-10-22, 7-11-07-21 Goal status: Met   5.  Pt will utilize dysarthria compensations in 5-10 minute conversation to optimize vocal intensity and clarity given occasional mod A over 2 sessions  Baseline:  Goal status: Partially Met   6.  Pt will  implement memory/attention compensations to aid daily functioning given occasional mod A over 2 sessions  Baseline:  Goal status: Partially Met   LONG TERM GOALS: Target date: 07/16/2022    Pt will complete Speak Out! HEP at least 1x/day (BID recommended) > 1 week  Baseline:  Goal status: ongoing   2.  Pt will achieve targeted dB levels in demonstration of Speak Out! Lessons with 90% accuracy given rare min A over 2 sessions  Baseline:  Goal status: ongoing   3.  Pt will utilize dysarthria compensations in 15+ minute conversation to optimize vocal intensity and clarity given occasional min A over 2 sessions Baseline:  Goal status: ongoing   4.  Pt will implement memory/attention compensations to aid daily functioning given occasional min A over 2 sessions  Baseline:  Goal status: ongoing   5.  Pt will report improved communication effectiveness via PROM by 2 point improvement by last ST session  Baseline: CPIB=26 Goal status: ongoing   ASSESSMENT:   CLINICAL IMPRESSION: Patient is a 81 y.o. male who was seen today for previous thalamic stroke and Parkinson's disease. 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 Conducted ongoing education and training of Speak Out! Program to optimize vocal intensity and clarity. Skilled ST is warranted to address dysarthria and cognition to maximize communication effectiveness and functional independence.    OBJECTIVE IMPAIRMENTS  Objective impairments include attention, memory, and dysarthria. These impairments are limiting patient from household responsibilities and effectively communicating at home and in community.Factors affecting potential to achieve goals and functional outcome are medical prognosis and previous level of function. Patient will benefit from skilled SLP services to address above impairments and improve overall function.   REHAB POTENTIAL: Good    PLAN: SLP FREQUENCY: 2x/week   SLP DURATION: 12 weeks   PLANNED INTERVENTIONS: Cueing hierachy, Cognitive reorganization, Internal/external aids, Functional tasks, Multimodal communication approach, SLP instruction and feedback, Compensatory strategies, and Patient/family education  Su Monks, CCC-SLP 05/27/2022, 12:30 PM

## 2022-05-27 ENCOUNTER — Ambulatory Visit: Payer: Medicare (Managed Care) | Admitting: Physical Therapy

## 2022-05-27 ENCOUNTER — Ambulatory Visit: Payer: Medicare (Managed Care) | Admitting: Speech Pathology

## 2022-05-27 ENCOUNTER — Ambulatory Visit: Payer: Medicare (Managed Care) | Admitting: Occupational Therapy

## 2022-05-27 ENCOUNTER — Encounter: Payer: Self-pay | Admitting: Occupational Therapy

## 2022-05-27 ENCOUNTER — Encounter: Payer: Self-pay | Admitting: Physical Therapy

## 2022-05-27 DIAGNOSIS — R2681 Unsteadiness on feet: Secondary | ICD-10-CM

## 2022-05-27 DIAGNOSIS — R2689 Other abnormalities of gait and mobility: Secondary | ICD-10-CM

## 2022-05-27 DIAGNOSIS — R293 Abnormal posture: Secondary | ICD-10-CM

## 2022-05-27 DIAGNOSIS — R471 Dysarthria and anarthria: Secondary | ICD-10-CM

## 2022-05-27 DIAGNOSIS — R1312 Dysphagia, oropharyngeal phase: Secondary | ICD-10-CM

## 2022-05-27 DIAGNOSIS — R41840 Attention and concentration deficit: Secondary | ICD-10-CM

## 2022-05-27 DIAGNOSIS — R251 Tremor, unspecified: Secondary | ICD-10-CM

## 2022-05-27 DIAGNOSIS — M6281 Muscle weakness (generalized): Secondary | ICD-10-CM

## 2022-05-27 DIAGNOSIS — R41841 Cognitive communication deficit: Secondary | ICD-10-CM

## 2022-05-27 DIAGNOSIS — R278 Other lack of coordination: Secondary | ICD-10-CM

## 2022-05-27 DIAGNOSIS — R41844 Frontal lobe and executive function deficit: Secondary | ICD-10-CM

## 2022-05-27 NOTE — Patient Instructions (Addendum)
Warm-Up:  May-me-my-moe-moo: hold out the vowel sound, connect the sounds together   Loud/Sustained "ah": Hold out as long as you can maintain your strong, powerful, intentional "ah"   Glide: 1 breath glide from low pitch to high pitch STOP & BREATHE start high and glide low   Counting: Say the numbers connected together with a held out vowel sound. Numbers on a single line are done in one breath. You will breathe where it says STOP. When you stop and breathe, take a good deep breathe to power your voice.    Reading: Pace yourself and power your voice with a nice, deep breath.    Cognitive Exercise: Increased effort from your normal speaking voice. Try STOP-THINK-then SPEAK   Carryover tasks: Ideas for you to practice your intentional speech. It can be beneficial to decide before doing something that in that particular task you will practice your strong speech.    [employer] website has recordings of lessons led by [employer]. This may help with home practice.

## 2022-05-27 NOTE — Therapy (Signed)
OUTPATIENT PHYSICAL THERAPY TREATMENT NOTE   Patient Name: Jordan [name] MRN: [id number] DOB:January 09, 1941, 81 y.o., male Today's Date: 05/27/2022  PCP: [name], MD REFERRING PROVIDER: [name], MD  END OF SESSION:   PT End of Session - 05/27/22 1405     Visit Number 7    Number of Visits 17    Date for PT Re-Evaluation 07/21/22    Authorization Type [employer]    PT Start Time 1403    PT Stop Time 1445    PT Time Calculation (min) 42 min    Equipment Utilized During Treatment Gait belt    Activity Tolerance Patient tolerated treatment well    Behavior During Therapy WFL for tasks assessed/performed               Past Medical History:  Diagnosis Date   Arthritis    R shoulder, bone spur   Arthritis    "hands" (03/25/2016)   Chronic diastolic heart failure (HCC)    Chronic lower back pain    Coronary heart disease    Dr Pernell Dupre   H/O cardiovascular stress test    perhaps last one was 2009   Heart murmur    12/29/11 echo: mild MR, no AS, trivial TR   History of gout    Hyperlipidemia    Hypertension    saw Dr. Linard Millers last earl;y- 2014, cardiac cath. last done ?2009, blocks seen didn't require any intervention at that point.   Narcolepsy    [name] 04-11-97; Mean Latency 1.80mn, SOREM 2   OSA on CPAP    NPSG 12-13-98 AHI 22.7   PONV (postoperative nausea and vomiting)    Presence of permanent cardiac pacemaker    Shortness of breath    with exertion    Stroke ([place]    Type II diabetes mellitus ([place]    Past Surgical History:  Procedure Laterality Date   CARDIAC CATHETERIZATION  2009?   EP IMPLANTABLE DEVICE N/A 03/25/2016   Procedure: Pacemaker Implant - Dual Chamber;  Surgeon: GEvans Lance MD;  [facility];  Service: Cardiovascular;  Laterality: N/A;   FRACTURE SURGERY     INSERT / REPLACE / REMOVE PACEMAKER  03/24/2016   INTRAMEDULLARY (IM) NAIL INTERTROCHANTERIC Left 02/08/2022   Procedure: INTRAMEDULLARY (IM) NAIL  INTERTROCHANTRIC;  Surgeon: DMeredith Pel MD;  [facility]  Service: Orthopedics;  Laterality: Left;   IR KYPHO LUMBAR INC FX REDUCE BONE BX UNI/BIL CANNULATION INC/IMAGING  12/29/2021   IR RADIOLOGIST EVAL & MGMT  12/15/2021   LEFT AND RIGHT HEART CATHETERIZATION WITH CORONARY ANGIOGRAM N/A 06/05/2014   Procedure: LEFT AND RIGHT HEART CATHETERIZATION WITH CORONARY ANGIOGRAM;  Surgeon: HSinclair Grooms MD;  [facility];  Service: Cardiovascular;  Laterality: N/A;   NASAL FRACTURE SURGERY     SHOULDER ARTHROSCOPY WITH ROTATOR CUFF REPAIR AND SUBACROMIAL DECOMPRESSION Right 08/16/2013   Procedure: SHOULDER ARTHROSCOPY WITH ROTATOR CUFF REPAIR AND SUBACROMIAL DECOMPRESSION;  Surgeon: JNita Sells MD;  [facility]  Service: Orthopedics;  Laterality: Right;  Right shoulder arthroscopy rotator cuff repair, subacromial decompression.   TONSILLECTOMY     Patient Active Problem List   Diagnosis Date Noted   Acute deep vein thrombosis (DVT) of left lower extremity ([facility] 04/29/2022   Closed intertrochanteric fracture of left hip, initial encounter ([facility]    Closed left hip fracture ([place] secondary to fall at home 02/08/2022   Paroxysmal atrial fibrillation ([place] 02/08/2022  Debility 01/08/2022   Constipation 01/07/2022   Physical deconditioning 80/22/3361   Acute metabolic encephalopathy 22/44/9753   Hypomagnesemia 01/04/2022   Pancreatitis 12/30/2021   Parkinson's disease ([place]) 12/21/2021   Parkinson disease ([place]) 12/18/2021   Frequent falls 12/18/2021   History of CVA (cerebrovascular accident) 12/18/2021   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   Hallucinations, visual 07/30/2021   Acetabulum fracture, left ([place]) 06/25/2021   Acute hip pain, left 06/24/2021   Fall at home, initial encounter 06/24/2021   Leukocytosis 06/24/2021   Chronic diastolic heart failure ([place])    Sinusitis, acute maxillary 06/24/2020   AKI (acute kidney injury) ([place])    Labile blood  glucose    Cough    Benign essential HTN    Skin lesion of hand    Hypoalbuminemia due to protein-calorie malnutrition ([place])    Controlled type 2 diabetes mellitus with hyperglycemia, without long-term current use of insulin ([place])    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   Morbid obesity ([address])    Acute blood loss anemia    Acute thalamic infarction ([place]) 04/15/2020   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[place]) 05/25/2019   TIA (transient ischemic attack) 02/17/2018   Type 2 diabetes mellitus with hyperlipidemia ([place]) 02/17/2018   Insomnia 02/01/2018   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   Chronotropic incompetence 07/09/2014   Dyspnea on exertion 03/03/2014   HLD (hyperlipidemia) 12/13/2007   Essential hypertension 12/13/2007   Class 1 obesity 12/05/2007   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   Coronary atherosclerosis 12/05/2007    REFERRING DIAG: I63.81 (ICD-10-CM) - 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[place]) G20 (ICD-10-CM) - Parkinson's disease ([place]) S72.145S (ICD-10-CM) - Type III open nondisplaced intertrochanteric fracture of left femur, sequela    THERAPY DIAG:  Other abnormalities of gait and mobility  Abnormal posture  Unsteadiness on feet  Muscle weakness (generalized)  Rationale for Evaluation and Treatment Rehabilitation  PERTINENT HISTORY: PMHx: PD (diagnosed Jan 2022), HLD, OSA, DM, HTN, chronic diastolic HF, CVA, frequent falls, L2 compression fx, L hip fx   PRECAUTIONS: Fall and ICD/Pacemaker, history of compression fracture at L2 with recent kyphoplasty   SUBJECTIVE: Tried the seated recumbent bike in his clubhouse, had trouble getting seated. Doesn't want much at home because he does not want to. Hasn't had the dizzy feeling since he was treated for BPPV.    VITALS There were no vitals filed for this visit.   PAIN:  Are you having pain? No  OBJECTIVE  TODAY'S TREATMENT:    GAIT TRAINING:  Gait pattern: step to pattern, decreased step  length- Right, decreased step length- Left, decreased stride length, decreased hip/knee flexion- Right, decreased hip/knee flexion- Left, shuffling, trunk flexed, poor foot clearance- Right, and poor foot clearance- Left Distance walked: 115' x 3  Assistive device utilized: Walker - 2 wheeled Level of assistance: CGA Comments: Pt continues to demonstrate significant over-reliance on BUEs during gait, cues throughout for upright posture and looking ahead and incr stride length. Educated on importance of posture with gait. Discussed importance of trying to walk at home with wife supervision to help build up strength/endurance.   SELF-CARE:  -Discussed walking program at home with pt and pt's spouse to help build up endurance/strength, for pt to ambulate in hallway with w/c follow and gradually incr time and distance that pt walks and to walk a few times each day.  -Discussed importance of drinking water and staying hydrated as pt mainly only drinks Diet Pepsi -Pt asking about going back to the chiropractor for  his back pain, PT advised against this due to pt's medical hx and to follow up with the doctor he has seen for his back and to move more and exercise at home to help his back.   NMR: -Seated PWR Up x10 reps, verbal and demo cues for proper technique and to hold for scap retraction -Performed sit <> stand with no UE support and standing PWR Up for posture x10 reps       PATIENT EDUCATION: Education details: See Self-Care, importance of walking at home.  Person educated: Patient and Spouse Education method: Explanation Education comprehension: verbalized understanding     HOME EXERCISE PROGRAM: Access Code: [email or web address] URL: [email or web address] Date: 04/23/2022 Prepared by: Estevan Ryder  Exercises - Clamshell  - 1 x daily - 7 x weekly - 3 sets - 10 reps - Seated Scapular Retraction  - 1 x daily - 7 x weekly - 3 sets - 10 reps - Seated Long Arc Quad  - 1 x daily - 7 x  weekly - 3 sets - 10 reps - Seated Hamstring Stretch  - 1 x daily - 7 x weekly - 3 sets - 10 reps - Sit to Stand  - 1 x daily - 7 x weekly - 3 sets - 10 reps - Seated Heel Raise  - 1 x daily - 7 x weekly - 3 sets - 10 reps - Seated March  - 1 x daily - 7 x weekly - 3 sets - 10 reps - Seated Flexion Stretch with Swiss Ball  - 1 x daily - 7 x weekly - 3 sets - 10 reps          GOALS: Goals reviewed with patient? Yes   SHORT TERM GOALS: Target date: 05/19/2022   Pt will be able to perform initial HEP for strengthening and balance to continue gains with supervision of wife. Baseline: Goal status: MET   2.  BERG to be assessed with STG/LTG written.  Baseline: 20/56 Goal status: MET   3.  Pt will improve 5x sit<>stand to less than or equal to 19 sec without UE support to demonstrate improved functional strength and transfer efficiency.   Baseline: 22.6 seconds without UE support from chair; 21seconds without UE support  Goal status: NOT MET   4.  Pt will ambulate >350' with RW mod I for improved household and short community distances. Baseline: requires CGA/MinA/ limited to 230' Goal status: NOT MET   5.  Pt will improve gait speed with RW to at least 1.3 ft/sec in order to demo improved community mobility.    Baseline:  .87 ft/sec with RW; .48f/s (0.376m) Goal status: NOT MET       LONG TERM GOALS: Target date: 06/16/2022     Pt will be independent with final HEP for strength,gait, balance in order to build upon functional gains made in therapy.   Baseline:  Goal status: INITIAL   2.  Patient will score >25/56 on Berg to demonstrate improved balance Baseline: 20/56 Goal status: REVISED   3.  Pt will improve TUG time with RW vs. LRAD to 26 seconds or less in order to demo decrease fall risk.   Baseline: 33.32 seconds with RW  Goal status: INITIAL   4.  Pt will improve gait speed with RW vs. LRAD to at least 1.9 ft/sec in order to demo improved community mobility.   Baseline: .87 ft/sec with RW Goal status: INITIAL   5.  Pt will ambulate >350' with RW  versus LRAD on paved surfaces with supervision for improved community mobility. Baseline: 230' Goal status: REVISED     ASSESSMENT:   CLINICAL IMPRESSION: Today's skilled session focused on endurance with gait with RW with pt needs cues throughout for posture and looking ahead. Pt still heavily reliant on BUE during gait and reports incr shoulder pain. Worked on postural strengthening with PWR Up in sitting and in standing after sit <> stands. Will continue to progress towards LTGs.     OBJECTIVE IMPAIRMENTS Abnormal gait, decreased activity tolerance, decreased balance, decreased coordination, decreased endurance, decreased knowledge of use of DME, decreased mobility, decreased ROM, decreased strength, impaired flexibility, postural dysfunction, and pain.    ACTIVITY LIMITATIONS cleaning, community activity, driving, meal prep, laundry, yard work, and shopping.    PERSONAL FACTORS Age, Past/current experiences, Time since onset of injury/illness/exacerbation, and 3+ comorbidities:  PD, HLD, OSA, DM, HTN, chronic diastolic HF, CVA, frequent falls, L2 compression fx, L hip fx   are also affecting patient's functional outcome.      REHAB POTENTIAL: Good   CLINICAL DECISION MAKING: Evolving/moderate complexity   EVALUATION COMPLEXITY: Moderate   PLAN: PT FREQUENCY: 2x/week   PT DURATION: 12 weeks, plus eval   PLANNED INTERVENTIONS: Therapeutic exercises, Therapeutic activity, Neuromuscular re-education, Balance training, Gait training, Patient/Family education, Joint mobilization, Stair training, Vestibular training, Canalith repositioning, DME instructions, and Manual therapy   PLAN FOR NEXT SESSION:  posture, functional strength/endurance, add balance to HEP, gait training, weight shifting to L  Arliss Journey, PT, DPT 05/27/2022, 2:51 PM

## 2022-05-28 ENCOUNTER — Other Ambulatory Visit: Payer: Self-pay | Admitting: Physical Medicine and Rehabilitation

## 2022-05-28 ENCOUNTER — Telehealth: Payer: Self-pay

## 2022-05-28 MED ORDER — AMANTADINE HCL 100 MG PO CAPS
200.0000 mg | ORAL_CAPSULE | Freq: Every day | ORAL | 3 refills | Status: DC
Start: 2022-05-28 — End: 2022-08-19

## 2022-05-28 NOTE — Telephone Encounter (Signed)
Refill request for Amanitidine and new prescription for Modafinil

## 2022-05-29 ENCOUNTER — Other Ambulatory Visit: Payer: Self-pay | Admitting: Physical Medicine and Rehabilitation

## 2022-05-29 MED ORDER — MODAFINIL 100 MG PO TABS
100.0000 mg | ORAL_TABLET | Freq: Every day | ORAL | 3 refills | Status: DC
Start: 2022-05-29 — End: 2022-06-21

## 2022-05-31 ENCOUNTER — Ambulatory Visit: Payer: Medicare (Managed Care)

## 2022-05-31 ENCOUNTER — Ambulatory Visit: Payer: Medicare (Managed Care) | Admitting: Physical Therapy

## 2022-05-31 ENCOUNTER — Ambulatory Visit: Payer: Medicare (Managed Care) | Admitting: Occupational Therapy

## 2022-05-31 ENCOUNTER — Encounter: Payer: Self-pay | Admitting: Physical Therapy

## 2022-05-31 DIAGNOSIS — R293 Abnormal posture: Secondary | ICD-10-CM

## 2022-05-31 DIAGNOSIS — R2681 Unsteadiness on feet: Secondary | ICD-10-CM

## 2022-05-31 DIAGNOSIS — M6281 Muscle weakness (generalized): Secondary | ICD-10-CM

## 2022-05-31 DIAGNOSIS — R41841 Cognitive communication deficit: Secondary | ICD-10-CM

## 2022-05-31 DIAGNOSIS — R278 Other lack of coordination: Secondary | ICD-10-CM

## 2022-05-31 DIAGNOSIS — R41840 Attention and concentration deficit: Secondary | ICD-10-CM

## 2022-05-31 DIAGNOSIS — R2689 Other abnormalities of gait and mobility: Secondary | ICD-10-CM

## 2022-05-31 DIAGNOSIS — R471 Dysarthria and anarthria: Secondary | ICD-10-CM

## 2022-05-31 NOTE — Therapy (Signed)
OUTPATIENT PHYSICAL THERAPY TREATMENT NOTE   Patient Name: Jordan [name] MRN: [id number] DOB:Jun 29, 1941, 81 y.o., male Today's Date: 05/31/2022  PCP: [name], MD REFERRING PROVIDER: [name], MD  END OF SESSION:   PT End of Session - 05/31/22 1234     Visit Number 8    Number of Visits 17    Date for PT Re-Evaluation 07/21/22    Authorization Type [employer]    PT Start Time 1232    PT Stop Time 1315    PT Time Calculation (min) 43 min    Equipment Utilized During Treatment Gait belt    Activity Tolerance Patient tolerated treatment well    Behavior During Therapy WFL for tasks assessed/performed               Past Medical History:  Diagnosis Date   Arthritis    R shoulder, bone spur   Arthritis    "hands" (03/25/2016)   Chronic diastolic heart failure (HCC)    Chronic lower back pain    Coronary heart disease    Dr Pernell Dupre   H/O cardiovascular stress test    perhaps last one was 2009   Heart murmur    12/29/11 echo: mild MR, no AS, trivial TR   History of gout    Hyperlipidemia    Hypertension    saw Dr. Linard Millers last earl;y- 2014, cardiac cath. last done ?2009, blocks seen didn't require any intervention at that point.   Narcolepsy    [name] 04-11-97; Mean Latency 1.92mn, SOREM 2   OSA on CPAP    NPSG 12-13-98 AHI 22.7   PONV (postoperative nausea and vomiting)    Presence of permanent cardiac pacemaker    Shortness of breath    with exertion    Stroke ([facility]    Type II diabetes mellitus ([facility]    Past Surgical History:  Procedure Laterality Date   CARDIAC CATHETERIZATION  2009?   EP IMPLANTABLE DEVICE N/A 03/25/2016   Procedure: Pacemaker Implant - Dual Chamber;  Surgeon: GEvans Lance MD;  [facility];  Service: Cardiovascular;  Laterality: N/A;   FRACTURE SURGERY     INSERT / REPLACE / REMOVE PACEMAKER  03/24/2016   INTRAMEDULLARY (IM) NAIL INTERTROCHANTERIC Left 02/08/2022   Procedure: INTRAMEDULLARY (IM) NAIL  INTERTROCHANTRIC;  Surgeon: DMeredith Pel MD;  [facility]  Service: Orthopedics;  Laterality: Left;   IR KYPHO LUMBAR INC FX REDUCE BONE BX UNI/BIL CANNULATION INC/IMAGING  12/29/2021   IR RADIOLOGIST EVAL & MGMT  12/15/2021   LEFT AND RIGHT HEART CATHETERIZATION WITH CORONARY ANGIOGRAM N/A 06/05/2014   Procedure: LEFT AND RIGHT HEART CATHETERIZATION WITH CORONARY ANGIOGRAM;  Surgeon: HSinclair Grooms MD;  [facility];  Service: Cardiovascular;  Laterality: N/A;   NASAL FRACTURE SURGERY     SHOULDER ARTHROSCOPY WITH ROTATOR CUFF REPAIR AND SUBACROMIAL DECOMPRESSION Right 08/16/2013   Procedure: SHOULDER ARTHROSCOPY WITH ROTATOR CUFF REPAIR AND SUBACROMIAL DECOMPRESSION;  Surgeon: JNita Sells MD;  [place] [facility]  Service: Orthopedics;  Laterality: Right;  Right shoulder arthroscopy rotator cuff repair, subacromial decompression.   TONSILLECTOMY     Patient Active Problem List   Diagnosis Date Noted   Acute deep vein thrombosis (DVT) of left lower extremity ([facility] 04/29/2022   Closed intertrochanteric fracture of left hip, initial encounter ([facility]    Closed left hip fracture ([facility] secondary to fall at home 02/08/2022   Paroxysmal atrial fibrillation ([facility] 02/08/2022  Debility 01/08/2022   Constipation 01/07/2022   Physical deconditioning 74/16/3845   Acute metabolic encephalopathy 36/46/8032   Hypomagnesemia 01/04/2022   Pancreatitis 12/30/2021   Parkinson's disease ([place]) 12/21/2021   Parkinson disease ([place]) 12/18/2021   Frequent falls 12/18/2021   History of CVA (cerebrovascular accident) 12/18/2021   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   Hallucinations, visual 07/30/2021   Acetabulum fracture, left ([place]) 06/25/2021   Acute hip pain, left 06/24/2021   Fall at home, initial encounter 06/24/2021   Leukocytosis 06/24/2021   Chronic diastolic heart failure ([place])    Sinusitis, acute maxillary 06/24/2020   AKI (acute kidney injury) ([place])    Labile blood  glucose    Cough    Benign essential HTN    Skin lesion of hand    Hypoalbuminemia due to protein-calorie malnutrition ([place])    Controlled type 2 diabetes mellitus with hyperglycemia, without long-term current use of insulin ([place])    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   Morbid obesity ([place])    Acute blood loss anemia    Acute thalamic infarction ([place]) 04/15/2020   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[place]) 05/25/2019   TIA (transient ischemic attack) 02/17/2018   Type 2 diabetes mellitus with hyperlipidemia ([place]) 02/17/2018   Insomnia 02/01/2018   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   Chronotropic incompetence 07/09/2014   Dyspnea on exertion 03/03/2014   HLD (hyperlipidemia) 12/13/2007   Essential hypertension 12/13/2007   Class 1 obesity 12/05/2007   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   Coronary atherosclerosis 12/05/2007    REFERRING DIAG: I63.81 (ICD-10-CM) - 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[place]) G20 (ICD-10-CM) - Parkinson's disease ([place]) S72.145S (ICD-10-CM) - Type III open nondisplaced intertrochanteric fracture of left femur, sequela    THERAPY DIAG:  Other abnormalities of gait and mobility  Abnormal posture  Unsteadiness on feet  Muscle weakness (generalized)  Rationale for Evaluation and Treatment Rehabilitation  PERTINENT HISTORY: PMHx: PD (diagnosed Jan 2022), HLD, OSA, DM, HTN, chronic diastolic HF, CVA, frequent falls, L2 compression fx, L hip fx   PRECAUTIONS: Fall and ICD/Pacemaker, history of compression fracture at L2 with recent kyphoplasty   SUBJECTIVE: Has not done much walking at home. No other changes.    VITALS There were no vitals filed for this visit.   PAIN:  Are you having pain? No  OBJECTIVE  TODAY'S TREATMENT:   Therapeutic Exercise:  NuStep with BUE/BLE at gear 3.0 for 8 minutes for strength, endurance, reciprocal movement. Educated on purpose and importance of larger amplitude movement. Cued throughout for RPE  at 6-7/10 as pt tends to not move through full ROM.   GAIT TRAINING:  Gait pattern: step to pattern, decreased step length- Right, decreased step length- Left, decreased stride length, decreased hip/knee flexion- Right, decreased hip/knee flexion- Left, shuffling, trunk flexed, poor foot clearance- Right, and poor foot clearance- Left Distance walked: 115' x 1  Assistive device utilized: Walker - 2 wheeled Level of assistance: CGA Comments: Pt continues to demonstrate significant over-reliance on BUEs during gait, cues throughout for upright posture and looking ahead and incr stride length. Educated on importance of posture with gait.  Continued to discuss importance of trying to walk at home with wife supervision to help build up strength/endurance.    NMR: At countertop:  -Wide BOS lateral weight shifting to sticky note targets x10 reps, pt needing UE support  -With BUE support: alternating SLS taps to bottom self in cabinet for balance, foot clearance;  chair posteriorly to pt, pt almost with LLE giving way  needing min guard for balance/safety, performed x10 reps total (5 reps each leg) -x10 reps heel <> toe raises for weight shifting, BUE support needed.  -x5 reps lateral stepping each leg, pt with incr difficulty shifting weight to LLE and taking a step with RLE. LLE almost buckling at times.      PATIENT EDUCATION: Education details: Continued importance of walking at home and performing HEP.  Person educated: Patient and Spouse Education method: Explanation Education comprehension: verbalized understanding     HOME EXERCISE PROGRAM: Access Code: [email or web address] URL: [email or web address] Date: 04/23/2022 Prepared by: Estevan Ryder  Exercises - Clamshell  - 1 x daily - 7 x weekly - 3 sets - 10 reps - Seated Scapular Retraction  - 1 x daily - 7 x weekly - 3 sets - 10 reps - Seated Long Arc Quad  - 1 x daily - 7 x weekly - 3 sets - 10 reps - Seated Hamstring Stretch  -  1 x daily - 7 x weekly - 3 sets - 10 reps - Sit to Stand  - 1 x daily - 7 x weekly - 3 sets - 10 reps - Seated Heel Raise  - 1 x daily - 7 x weekly - 3 sets - 10 reps - Seated March  - 1 x daily - 7 x weekly - 3 sets - 10 reps - Seated Flexion Stretch with Swiss Ball  - 1 x daily - 7 x weekly - 3 sets - 10 reps          GOALS: Goals reviewed with patient? Yes   SHORT TERM GOALS: Target date: 05/19/2022   Pt will be able to perform initial HEP for strengthening and balance to continue gains with supervision of wife. Baseline: Goal status: MET   2.  BERG to be assessed with STG/LTG written.  Baseline: 20/56 Goal status: MET   3.  Pt will improve 5x sit<>stand to less than or equal to 19 sec without UE support to demonstrate improved functional strength and transfer efficiency.   Baseline: 22.6 seconds without UE support from chair; 21seconds without UE support  Goal status: NOT MET   4.  Pt will ambulate >350' with RW mod I for improved household and short community distances. Baseline: requires CGA/MinA/ limited to 230' Goal status: NOT MET   5.  Pt will improve gait speed with RW to at least 1.3 ft/sec in order to demo improved community mobility.    Baseline:  .87 ft/sec with RW; .35f/s (0.370m) Goal status: NOT MET       LONG TERM GOALS: Target date: 06/16/2022     Pt will be independent with final HEP for strength,gait, balance in order to build upon functional gains made in therapy.   Baseline:  Goal status: INITIAL   2.  Patient will score >25/56 on Berg to demonstrate improved balance Baseline: 20/56 Goal status: REVISED   3.  Pt will improve TUG time with RW vs. LRAD to 26 seconds or less in order to demo decrease fall risk.   Baseline: 33.32 seconds with RW  Goal status: INITIAL   4.  Pt will improve gait speed with RW vs. LRAD to at least 1.9 ft/sec in order to demo improved community mobility.  Baseline: .87 ft/sec with RW Goal status: INITIAL   5.   Pt will ambulate >350' with RW versus LRAD on paved surfaces with supervision for improved community mobility. Baseline: 230' Goal status: REVISED     ASSESSMENT:  CLINICAL IMPRESSION: Today's skilled session focused on BLE strengthening and standing balance tasks at the countertop. Pt needing frequent seated rest breaks due to fatigue. Pt needing a chair behind him for safety with SLS/weight shifting tasks when shifting to his LLE as it had tendency to almost buckle. Continued to educate on importance of walking with wife at home with w/c follow to help build up endurance/strength. Will continue to progress towards LTGs.     OBJECTIVE IMPAIRMENTS Abnormal gait, decreased activity tolerance, decreased balance, decreased coordination, decreased endurance, decreased knowledge of use of DME, decreased mobility, decreased ROM, decreased strength, impaired flexibility, postural dysfunction, and pain.    ACTIVITY LIMITATIONS cleaning, community activity, driving, meal prep, laundry, yard work, and shopping.    PERSONAL FACTORS Age, Past/current experiences, Time since onset of injury/illness/exacerbation, and 3+ comorbidities:  PD, HLD, OSA, DM, HTN, chronic diastolic HF, CVA, frequent falls, L2 compression fx, L hip fx   are also affecting patient's functional outcome.      REHAB POTENTIAL: Good   CLINICAL DECISION MAKING: Evolving/moderate complexity   EVALUATION COMPLEXITY: Moderate   PLAN: PT FREQUENCY: 2x/week   PT DURATION: 12 weeks, plus eval   PLANNED INTERVENTIONS: Therapeutic exercises, Therapeutic activity, Neuromuscular re-education, Balance training, Gait training, Patient/Family education, Joint mobilization, Stair training, Vestibular training, Canalith repositioning, DME instructions, and Manual therapy   PLAN FOR NEXT SESSION:  posture, functional strength/endurance, add balance to HEP, gait training, weight shifting to L  Arliss Journey, PT, DPT 05/31/2022, 2:53  PM

## 2022-05-31 NOTE — Patient Instructions (Addendum)
When you speak or practice Speak Out!,   Be louder than normal or   Talk with more effort (think 10/10 effort)   You have to recognize when your volume is dropping in order to fix it: Family can give you clues (cupping their ear, asking for repetition, leaning closer)

## 2022-05-31 NOTE — Therapy (Signed)
OUTPATIENT SPEECH LANGUAGE PATHOLOGY TREATMENT NOTE   Patient Name: Jordan [name] MRN: [id number] DOB:17-Dec-1940, 81 y.o., male Today's Date: 05/31/2022  PCP: [name] MD REFERRING PROVIDER: Izora Ribas, MD   END OF SESSION:   End of Session - 05/31/22 1252     Visit Number 9    Number of Visits 25    Date for SLP Re-Evaluation 07/16/22    Authorization Type [employer] Medicare  $20 co pay per day    SLP Start Time 1315    SLP Stop Time  1400    SLP Time Calculation (min) 45 min    Activity Tolerance Patient tolerated treatment well              Speech Therapy Progress Note  Dates of Reporting Period: 04-21-22 to current  Objective Reports of Subjective Statement: Pt has been seen for 10 ST visits targeting dysarthria and cognition. Some mild subjective improvements in vocal intensity exhibited with usual cues and modeling required for carryover.   Objective Measurements: Pt presents with difficulty comprehending and carrying over targeted techniques and principles of Speak Out! Program despite extensive re-education, written cues, and consistent modeling from SLP team. Cognitive deficits appear to be limiting understanding and consistency of carryover at this time. Further caregiver education and training warranted to maximize patient performance and outcomes with trained techniques.   Goal Update: see goals below  Plan: continue per POC  Reason Skilled Services are Required: Pt would continue to benefit from skilled ST intervention as pt able to demonstrate ability to achieve WNL volume and clarity with modeling and cues.    Past Medical History:  Diagnosis Date   Arthritis    R shoulder, bone spur   Arthritis    "hands" (03/25/2016)   Chronic diastolic heart failure (HCC)    Chronic lower back pain    Coronary heart disease    Dr Pernell Dupre   H/O cardiovascular stress test    perhaps last one was 2009   Heart murmur    12/29/11 echo: mild MR, no AS,  trivial TR   History of gout    Hyperlipidemia    Hypertension    saw Dr. Linard Millers last earl;y- 2014, cardiac cath. last done ?2009, blocks seen didn't require any intervention at that point.   Narcolepsy    [name] 04-11-97; Mean Latency 1.31mn, SOREM 2   OSA on CPAP    NPSG 12-13-98 AHI 22.7   PONV (postoperative nausea and vomiting)    Presence of permanent cardiac pacemaker    Shortness of breath    with exertion    Stroke ([place]    Type II diabetes mellitus ([facility]    Past Surgical History:  Procedure Laterality Date   CARDIAC CATHETERIZATION  2009?   EP IMPLANTABLE DEVICE N/A 03/25/2016   Procedure: Pacemaker Implant - Dual Chamber;  Surgeon: GEvans Lance MD;  [facility];  Service: Cardiovascular;  Laterality: N/A;   FRACTURE SURGERY     INSERT / REPLACE / REMOVE PACEMAKER  03/24/2016   INTRAMEDULLARY (IM) NAIL INTERTROCHANTERIC Left 02/08/2022   Procedure: INTRAMEDULLARY (IM) NAIL INTERTROCHANTRIC;  Surgeon: DMeredith Pel MD;  [facility]  Service: Orthopedics;  Laterality: Left;   IR KYPHO LUMBAR INC FX REDUCE BONE BX UNI/BIL CANNULATION INC/IMAGING  12/29/2021   IR RADIOLOGIST EVAL & MGMT  12/15/2021   LEFT AND RIGHT HEART CATHETERIZATION WITH CORONARY ANGIOGRAM N/A 06/05/2014   Procedure: LEFT AND RIGHT HEART CATHETERIZATION  WITH CORONARY ANGIOGRAM;  Surgeon: Sinclair Grooms, MD;  Location[facility];  Service: Cardiovascular;  Laterality: N/A;   NASAL FRACTURE SURGERY     SHOULDER ARTHROSCOPY WITH ROTATOR CUFF REPAIR AND SUBACROMIAL DECOMPRESSION Right 08/16/2013   Procedure: SHOULDER ARTHROSCOPY WITH ROTATOR CUFF REPAIR AND SUBACROMIAL DECOMPRESSION;  Surgeon: [name], MD;  [place];  Service: Orthopedics;  Laterality: Right;  Right shoulder arthroscopy rotator cuff repair, subacromial decompression.   TONSILLECTOMY     Patient Active Problem List   Diagnosis Date Noted   Acute deep vein thrombosis (DVT) of left lower  extremity ([place]) 04/29/2022   Closed intertrochanteric fracture of left hip, initial encounter [facility])    Closed left hip fracture (HCC) secondary to fall at home 02/08/2022   Paroxysmal atrial fibrillation ([place]) 02/08/2022   Debility 01/08/2022   Constipation 01/07/2022   Physical deconditioning 71/69/6789   Acute metabolic encephalopathy 38/08/1750   Hypomagnesemia 01/04/2022   Pancreatitis 12/30/2021   Parkinson's disease ([place]) 12/21/2021   Parkinson disease ([place]) 12/18/2021   Frequent falls 12/18/2021   History of CVA (cerebrovascular accident) 12/18/2021   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   Hallucinations, visual 07/30/2021   Acetabulum fracture, left ([place]) 06/25/2021   Acute hip pain, left 06/24/2021   Fall at home, initial encounter 06/24/2021   Leukocytosis 06/24/2021   Chronic diastolic heart failure (HCC)    Sinusitis, acute maxillary 06/24/2020   AKI (acute kidney injury) ([place])    Labile blood glucose    Cough    Benign essential HTN    Skin lesion of hand    Hypoalbuminemia due to protein-calorie malnutrition ([place])    Controlled type 2 diabetes mellitus with hyperglycemia, without long-term current use of insulin (HCC)    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   Morbid obesity ([place])    Acute blood loss anemia    Acute thalamic infarction ([place]) 04/15/2020   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[place]) 05/25/2019   TIA (transient ischemic attack) 02/17/2018   Type 2 diabetes mellitus with hyperlipidemia ([place]) 02/17/2018   Insomnia 02/01/2018   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   Chronotropic incompetence 07/09/2014   Dyspnea on exertion 03/03/2014   HLD (hyperlipidemia) 12/13/2007   Essential hypertension 12/13/2007   Class 1 obesity 12/05/2007   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 No  OBJECTIVE:     TODAY'S TREATMENT:  05-31-22: Pt entered with inconsistent conversational volume (mid 60s to low 70s dB) with intermittent cues. Ongoing confusion and reduced recall exhibited for accurate completion of Speak Out! Program despite use of written cues, modeling, and extensive re-education. SLP modified and simplified tasks with sole focus on loudness (versus duration, breath support). Trained use of effort scale to optimize carryover as pt appears to have difficult time perceiving and recognizing targeted vocal intensity. Usual mod-A required for accurate completion of tasks and to meet listed average dBs. Loud ah: 88 dB; counting 77 dB; reading paragraphs 72 dB; cognitive task 70 dB. Subsequent conversation of 5 minutes required usual min to mod A to maintain conversational volume with use of visual aids.   05-27-22: Reports "slow"  progress on speech exercise. Continues to present with some questions regarding structure and purpose of targeted exercises. SLP answers all questions and provides usual models of all exercises to aid in successful completion of tasks. Usual mod-A required for accurate completion of tasks and to meet listed average dBs. Loud ah: 88 dB; counting 76 dB; reading paragraphs 74 dB; cognitive task 69 dB. Some intermittent hoarseness observed which was able to clear with mod-A for intentional speech.   05-24-22: Pt enters with low 70's to mid 60's dB. Targeted improving vocal quality and increasing intensity through progressively difficulty speech tasks using Speak Out! program, lesson 11. ST leads pt through exercises providing frequent model prior to pt execution. Usual mod-A required to  achieve target dB this date. Averages this date: loud "ah" 90 dB; reading 84 dB; cognitive speech task 73 dB. Effective pausing to formulate thoughts exhibited. Conversational sample of approx 3 minutes, pt averaged upper 67 dB with occasional to usual in-A to utilize intentional speech. He required clarification that sustained loud /a/ should be consistent pitch as he was gradually increasing pitch during /a/. Spouse present. Modeled cueing and instructed her the exercises should be done twice a day. Showed where to access online practice.  PATIENT EDUCATION: Education details: see above Person educated: Patient and Spouse Education method: Explanation, Demonstration, and Handouts Education comprehension: verbalized understanding, returned demonstration, and needs further education     HOME EXERCISE PROGRAM: Speak Out daily     GOALS: Goals reviewed with patient? Yes   SHORT TERM GOALS: Target date: 05/19/2022     Pt will complete Speak Out! HEP at least 1x/day given occasional min A over 2 sessions  Baseline: 05-17-22 Goal status: Met   2.  Pt will achieve targeted dB (85-90 dB) on warm up exercises with 80% accuracy given occasional min A over 2 sessions  Baseline:  Goal status: Partially Met   3.  Pt will achieve targeted dB (75-85 dB) on reading exercises with 80% accuracy given occasional min A over 2 sessions Baseline: 05-10-22, 05-17-22 Goal status: Met   4.  Pt will achieve targeted dB (72-78 dB) on cognitive exercises with 80% accuracy given occasional min A over 2 sessions Baseline: 05-10-22, 7-11-07-21 Goal status: Met   5.  Pt will utilize dysarthria compensations in 5-10 minute conversation to optimize vocal intensity and clarity given occasional mod A over 2 sessions  Baseline:  Goal status: Partially Met   6.  Pt will implement memory/attention compensations to aid daily functioning given occasional mod A over 2 sessions  Baseline:  Goal status: Partially Met   LONG  TERM GOALS: Target date: 07/16/2022    Pt will complete Speak Out! HEP at least 1x/day (BID recommended) > 1 week  Baseline:  Goal status: ongoing   2.  Pt will achieve targeted dB levels in demonstration of Speak Out! Lessons with 90% accuracy given rare min A over 2 sessions  Baseline:  Goal status: ongoing   3.  Pt will utilize dysarthria compensations in 15+ minute conversation to optimize vocal intensity and clarity given occasional min A over 2 sessions Baseline:  Goal status: ongoing   4.  Pt will implement memory/attention compensations to aid daily functioning given occasional min A over 2 sessions  Baseline:  Goal status: ongoing   5.  Pt will report improved communication effectiveness via PROM by 2 point improvement by last ST session  Baseline: CPIB=26 Goal status: ongoing   ASSESSMENT:   CLINICAL IMPRESSION: Patient  is a 81 y.o. male who was seen today for previous thalamic stroke and Parkinson's disease. 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 Conducted ongoing education and training of Speak Out! Program to optimize vocal intensity and clarity. Usual modeling and cues required to aid patient comprehension and implementation of targeted techniques to maximize speech intelligibility. Skilled ST is warranted to address dysarthria and cognition to maximize communication effectiveness and functional independence.    OBJECTIVE IMPAIRMENTS  Objective impairments include attention, memory, and dysarthria. These impairments are limiting patient from household responsibilities and effectively communicating at home and in community.Factors affecting potential to achieve goals and functional outcome are medical prognosis and previous level of function. Patient will benefit from skilled SLP services to address above impairments and improve overall function.   REHAB POTENTIAL:  Good   PLAN: SLP FREQUENCY: 2x/week   SLP DURATION: 12 weeks   PLANNED INTERVENTIONS: Cueing hierachy, Cognitive reorganization, Internal/external aids, Functional tasks, Multimodal communication approach, SLP instruction and feedback, Compensatory strategies, and Patient/family education  Marzetta Board, CCC-SLP 05/31/2022, 12:53 PM

## 2022-05-31 NOTE — Therapy (Signed)
OUTPATIENT OCCUPATIONAL THERAPY [facility]  Patient Name: Jordan [name] MRN: [id number] DOB:Jan 17, 1941, 81 y.o., male Today's Date: 05/31/2022  PCP: Dr. Kenton Kingfisher REFERRING PROVIDER: Dr. Ranell Patrick   OT End of Session - 05/31/22 1407     Visit Number 9    Number of Visits 24    Date for OT Re-Evaluation 07/15/22    Authorization Type [employer]    Authorization Time Period met OOP max  VL:MN  Follow Medicare Guidelines    Authorization - Visit Number 9    Authorization - Number of Visits 10    Progress Note Due on Visit 10    OT Start Time 1405    OT Stop Time 1445    OT Time Calculation (min) 40 min    Activity Tolerance Patient tolerated treatment well    Behavior During Therapy [facility] for tasks assessed/performed                   Past Medical History:  Diagnosis Date   Arthritis    R shoulder, bone spur   Arthritis    "hands" (03/25/2016)   Chronic diastolic heart failure (HCC)    Chronic lower back pain    Coronary heart disease    Dr Pernell Dupre   H/O cardiovascular stress test    perhaps last one was 2009   Heart murmur    12/29/11 echo: mild MR, no AS, trivial TR   History of gout    Hyperlipidemia    Hypertension    saw Dr. Linard Millers last earl;y- 2014, cardiac cath. last done ?2009, blocks seen didn't require any intervention at that point.   Narcolepsy    [name] 04-11-97; Mean Latency 1.61min, SOREM 2   OSA on CPAP    NPSG 12-13-98 AHI 22.7   PONV (postoperative nausea and vomiting)    Presence of permanent cardiac pacemaker    Shortness of breath    with exertion    Stroke ([place])    Type II diabetes mellitus ([place])    Past Surgical History:  Procedure Laterality Date   CARDIAC CATHETERIZATION  2009?   EP IMPLANTABLE DEVICE N/A 03/25/2016   Procedure: Pacemaker Implant - Dual Chamber;  Surgeon: Evans Lance, MD;  Location: [place] [facility];  Service: Cardiovascular;  Laterality: N/A;   FRACTURE SURGERY     INSERT / REPLACE / REMOVE  PACEMAKER  03/24/2016   INTRAMEDULLARY (IM) NAIL INTERTROCHANTERIC Left 02/08/2022   Procedure: INTRAMEDULLARY (IM) NAIL INTERTROCHANTRIC;  Surgeon: Meredith Pel, MD;  Location: [place];  Service: Orthopedics;  Laterality: Left;   IR KYPHO LUMBAR INC FX REDUCE BONE BX UNI/BIL CANNULATION INC/IMAGING  12/29/2021   IR RADIOLOGIST EVAL & MGMT  12/15/2021   LEFT AND RIGHT HEART CATHETERIZATION WITH CORONARY ANGIOGRAM N/A 06/05/2014   Procedure: LEFT AND RIGHT HEART CATHETERIZATION WITH CORONARY ANGIOGRAM;  Surgeon: Sinclair Grooms, MD;  Location: [facility];  Service: Cardiovascular;  Laterality: N/A;   NASAL FRACTURE SURGERY     SHOULDER ARTHROSCOPY WITH ROTATOR CUFF REPAIR AND SUBACROMIAL DECOMPRESSION Right 08/16/2013   Procedure: SHOULDER ARTHROSCOPY WITH ROTATOR CUFF REPAIR AND SUBACROMIAL DECOMPRESSION;  Surgeon: [name], MD;  Location: [place];  Service: Orthopedics;  Laterality: Right;  Right shoulder arthroscopy rotator cuff repair, subacromial decompression.   TONSILLECTOMY     Patient Active Problem List   Diagnosis Date Noted   Acute deep vein thrombosis (DVT) of left lower extremity ([place]) 04/29/2022   Closed intertrochanteric fracture  of left hip, initial encounter [facility])    Closed left hip fracture ([place]) secondary to fall at home 02/08/2022   Paroxysmal atrial fibrillation ([place]) 02/08/2022   Debility 01/08/2022   Constipation 01/07/2022   Physical deconditioning 35/00/9381   Acute metabolic encephalopathy 82/99/3716   Hypomagnesemia 01/04/2022   Pancreatitis 12/30/2021   Parkinson's disease ([place]) 12/21/2021   Parkinson disease ([place]) 12/18/2021   Frequent falls 12/18/2021   History of CVA (cerebrovascular accident) 12/18/2021   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   Hallucinations, visual 07/30/2021   Acetabulum fracture, left ([place]) 06/25/2021   Acute hip pain, left 06/24/2021   Fall at home, initial encounter 06/24/2021   Leukocytosis 06/24/2021   Chronic  diastolic heart failure (HCC)    Sinusitis, acute maxillary 06/24/2020   AKI (acute kidney injury) ([place])    Labile blood glucose    Cough    Benign essential HTN    Skin lesion of hand    Hypoalbuminemia due to protein-calorie malnutrition ([place])    Controlled type 2 diabetes mellitus with hyperglycemia, without long-term current use of insulin (HCC)    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   Morbid obesity ([place])    Acute blood loss anemia    Acute thalamic infarction ([place]) 04/15/2020   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[place]) 05/25/2019   TIA (transient ischemic attack) 02/17/2018   Type 2 diabetes mellitus with hyperlipidemia ([place]) 02/17/2018   Insomnia 02/01/2018   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   Chronotropic incompetence 07/09/2014   Dyspnea on exertion 03/03/2014   HLD (hyperlipidemia) 12/13/2007   Essential hypertension 12/13/2007   Class 1 obesity 12/05/2007   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   Coronary atherosclerosis 12/05/2007    ONSET DATE: 04/19/22  REFERRING DIAG: thalamic CVA I63.81  THERAPY DIAG:  Abnormal posture  Unsteadiness on feet  Other lack of coordination  Attention and concentration deficit  Rationale for Evaluation and Treatment Rehabilitation  SUBJECTIVE:   SUBJECTIVE STATEMENT: "That really worked my middle" --standing and reaching  Pt accompanied by:  self, wife   PERTINENT HISTORY: Jordan [name] is an 81 year old male with history of CAD/chronic diastolic CHF, OSA, R6VE, gout, Parkinson's diease with recent hospital admission 02/17-02-22-23 worsening of gait disorder with falls with hallucinations which was treated with medication adjustment and he was discharged to home. He was readmitted on 12/30/21 with abdominal pain with low grade fever, leucocytosis, lactic acidosis, mild confusion and lethargy secondary to sepsis. He was found to have elevated lipase-165  with inflammatory changes  suggestive of primary pancreatitis with secondary inflammation of hepatic flexure of colon or primary sigmoid diverticulitis with secondary extension to pancreatic tail. He was treated with IVF and broad spectrum antibiotics.   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 Pt fell and sustained hip fx on 02/08/22 and pt underwent left  IM nailing.  04/30/22:  hospitalized with blood clot LLE--changed meds and cleared to resume OT/PT.   Pt accompanied by: self and significant other, spouse, [name]    PRECAUTIONS: Fall and Other: , hx of back kyphoplasty  , pacemaker, hx of L hip fx, s/p IM nailing, hx of shoulder dislocations, hx of LLE DVT  WEIGHT BEARING RESTRICTIONS No  PAIN:  Are you having pain? Yes, lower back (pain patch on) 0/10 at rest, fluctuates w/ movement. O.T. addressing through posture, reaching ex's  PLOF: Independent prior to CVA in 2021  PATIENT GOALS to be more independent, pt's wife states he would like to be able to drive  OBJECTIVE:   TODAY'S TREATMENT:   Practiced LB dressing: doffing socks and shoes I'ly. Donning Lt sock w/ assist to straighten out over toes before pulling up (using foot stool), min assist required to use LH shoe horn for donning Lt shoe. Pt reports he has sock aide and LH shoe horn at home. Pt required extra time to perform. Pt can cross Rt leg over Lt knee to don/doff but pt reports using lower railing on bed to prop foot on (both sides)   UB dressing w/ pull over shirt w/ cues to get shirt above elbows when donning, use PWR! Hands to grip before donning over head, and pulling hard to doff over head. However, pt could physically do I'ly and efficiently  Standing at sink for approx 3-5 min. X 2 working on upright posture, wt shifts bilaterally w/ reaching to each side. Cues needed to stand taller and to look at hand. Limited neck/head movement noted   PATIENT  EDUCATION: Education details: cane exercise review Person educated: Patient, wife Education method: Explanation, demonstration, verbal cues Education comprehension: verbalized understanding, returned demonstration, verbal cues   HOME EXERCISE PROGRAM: Coordination 05/10/22:  cane HEP (shoulder flex, chest press, and shoulder abduction); strategies for cutting food and donning shirt 05/17/22:  strategies for donning sock with sock aide, donning pants with reacher, and writing    GOALS: Goals reviewed with patient? No  SHORT TERM GOALS: Target date: 05/14/22--extend to 05/27/22 due to missed visits  I with initial HEP Baseline: Goal status: IN PROGRESS  2.  Pt will verbalize understanding of adapted strategies to maximize safety and I with ADLs/ IADLs (QV:ZDGLOVF food) Baseline:  Goal status: IN PROGRESS  3.  Pt will demonstrate improved UE functional use as evidenced by increasing LUE box/ blocks score by 3 blocks. Baseline: RUE 45, LUE 37 Goal status: 05/27/22:  ONGOING R-37blocks, L-39 blocks   4.  Pt will perform LB dressing with min A demonstrating good safety awareness. Baseline: mod-max A Goal status: PARTIALLY MET - extra time  5.  Pt will consistently donn pullover shirt mod I in a reasonable amount of time Baseline: increased time required and prn min assist Goal status: MET  6.   Pt will verbalize understanding of ways to prevent future PD related complications and PD community resources. Goal status: IN PROGRESS  7. Pt will retrieve items from overhead shelf at 95* with right UE with -15 elbow extension Baseline: 95, -20 Goal status: PARTIALLY MET.  115* with -20* elbow ext.  8. Pt will retrieve items from overhead shelf at 110 with left UE with -10 elbow extension Baseline: 110, -15 Goal status: Partially MET 05/27/22:  L-125* -15* elbow ext  LONG TERM GOALS: Target date: 07/15/2022    Pt will demonstrate understanding of memory compensations and ways to keep  thinking skills sharp  Goal status: INITIAL  2.  Pt will demonstrate improved fine motor coordination for ADLs as evidenced by decreasing 9 hole peg test score for bilateral UE's by 3 secs Baseline: RUE 42.47, LUE 42.25 Goal status: INITIAL  3.  Pt will demonstrate improved ease with feeding as evidenced by decreasing PPT#2 (self feeding) by 3 secs Baseline: 16.66 secs Goal status: INITIAL  4.  Pt will demonstrate improved ease with fastening buttons as evidenced by decreasing 3 button/ unbutton time to : 95 secs Baseline: 1 min 45 secs Goal status: INITIAL  5.  Pt will write a short paragraph with 100% legibility and no significant decrease in letter size Baseline: mild micrographia Goal status: INITIAL  6.  Pt will donn/ doff jacket in 2 mins and 30 secs or less in seated. Goal status: INITIAL   ASSESSMENT:  CLINICAL IMPRESSION: Pt is progressing slowly towards goals. He remains limited by abnormal posture, balance, cognitive deficits and decreased coordination/bradykinesia.    PERFORMANCE DEFICITS in functional skills including ADLs, IADLs, coordination, dexterity, sensation, ROM, strength, pain, flexibility, FMC, GMC, mobility, balance, endurance, decreased knowledge of precautions, decreased knowledge of use of DME, UE functional use, and bradykinesia, cognitive skills including attention, learn, memory, problem solving, safety awareness, and thought, and psychosocial skills including coping strategies, environmental adaptation, habits, interpersonal interactions, and routines and behaviors.   IMPAIRMENTS are limiting patient from ADLs, IADLs, rest and sleep, play, leisure, and social participation.   COMORBIDITIES may have co-morbidities  that affects occupational performance. Patient will benefit from skilled OT to address above impairments and improve overall function.  MODIFICATION OR ASSISTANCE TO COMPLETE EVALUATION: Min-Moderate modification of tasks or assist with  assess necessary to complete an evaluation.  OT OCCUPATIONAL PROFILE AND HISTORY: Detailed assessment: Review of records and additional review of physical, cognitive, psychosocial history related to current functional performance.  CLINICAL DECISION MAKING: Moderate - several treatment options, min-mod task modification necessary  REHAB POTENTIAL: Good  EVALUATION COMPLEXITY: Moderate    PLAN: OT FREQUENCY: 2x/week  OT DURATION: 12 weeks plus eval  PLANNED INTERVENTIONS: self care/ADL training, therapeutic exercise, therapeutic activity, neuromuscular re-education, manual therapy, passive range of motion, balance training, functional mobility training, aquatic therapy, ultrasound, paraffin, fluidotherapy, moist heat, cryotherapy, contrast bath, patient/family education, cognitive remediation/compensation, visual/perceptual remediation/compensation, energy conservation, coping strategies training, and DME and/or AE instructions  RECOMMENDED OTHER SERVICES: n/a  CONSULTED AND AGREED WITH PLAN OF CARE: Patient and family member/caregiver  PLAN FOR NEXT SESSION:   10th progress note, check remaining STG's, may want to practice LE dressing w/ sock aide and theraband loop (did not do in this session)    Hans Eden, OTR/L 05/31/2022, 2:07 PM  [phone] Phone: [phone] 2:07 PM 05/31/22

## 2022-06-01 ENCOUNTER — Other Ambulatory Visit: Payer: Self-pay | Admitting: Neurology

## 2022-06-03 ENCOUNTER — Ambulatory Visit: Payer: Medicare (Managed Care) | Attending: Family Medicine | Admitting: Occupational Therapy

## 2022-06-03 ENCOUNTER — Ambulatory Visit: Payer: Medicare (Managed Care)

## 2022-06-03 ENCOUNTER — Ambulatory Visit: Payer: Medicare (Managed Care) | Admitting: Physical Therapy

## 2022-06-03 VITALS — BP 106/62 | HR 72

## 2022-06-03 DIAGNOSIS — R41841 Cognitive communication deficit: Secondary | ICD-10-CM | POA: Diagnosis not present

## 2022-06-03 DIAGNOSIS — R293 Abnormal posture: Secondary | ICD-10-CM | POA: Insufficient documentation

## 2022-06-03 DIAGNOSIS — R2689 Other abnormalities of gait and mobility: Secondary | ICD-10-CM | POA: Insufficient documentation

## 2022-06-03 DIAGNOSIS — R41844 Frontal lobe and executive function deficit: Secondary | ICD-10-CM | POA: Diagnosis not present

## 2022-06-03 DIAGNOSIS — R2681 Unsteadiness on feet: Secondary | ICD-10-CM | POA: Diagnosis not present

## 2022-06-03 DIAGNOSIS — M6281 Muscle weakness (generalized): Secondary | ICD-10-CM | POA: Diagnosis not present

## 2022-06-03 DIAGNOSIS — H8111 Benign paroxysmal vertigo, right ear: Secondary | ICD-10-CM | POA: Insufficient documentation

## 2022-06-03 DIAGNOSIS — R41840 Attention and concentration deficit: Secondary | ICD-10-CM | POA: Diagnosis not present

## 2022-06-03 DIAGNOSIS — R251 Tremor, unspecified: Secondary | ICD-10-CM | POA: Insufficient documentation

## 2022-06-03 DIAGNOSIS — R471 Dysarthria and anarthria: Secondary | ICD-10-CM | POA: Diagnosis not present

## 2022-06-03 DIAGNOSIS — R278 Other lack of coordination: Secondary | ICD-10-CM | POA: Diagnosis not present

## 2022-06-03 NOTE — Patient Instructions (Addendum)
Make sure you are hydrating through the day - your voice box likes to the hydration  If you feel the need to clear your throat, take a sip of drink or swallow your saliva instead   Every time you speak, speak with INTENT.

## 2022-06-03 NOTE — Patient Instructions (Signed)
Ways to prevent future Parkinson's related complications: 1.   Exercise regularly,  2.   Focus on BIGGER movements during daily activities- really reach overhead, straighten elbows and extend fingers 3.   When dressing or reaching for your seatbelt make sure to use your body to assist by twisting while you reach-this can help to minimize stress on the shoulder and reduce the risk of a rotator cuff tear

## 2022-06-03 NOTE — Therapy (Signed)
OUTPATIENT OCCUPATIONAL THERAPY [facility]  Patient Name: Jordan [name] MRN: [id number] DOB:13-Jun-1941, 81 y.o., male Today's Date: 06/03/2022  PCP: Dr. Kenton [name] REFERRING PROVIDER: Dr. Ranell [name]   OT End of Session - 06/03/22 1330     Visit Number 10    Number of Visits 24    Date for OT Re-Evaluation 07/15/22    Authorization Type [employer]    Authorization Time Period met OOP max  VL:MN  Follow Medicare Guidelines    Authorization - Visit Number 9    Authorization - Number of Visits 10    Progress Note Due on Visit 10    OT Start Time 1318    OT Stop Time 1358    OT Time Calculation (min) 40 min    Activity Tolerance Patient tolerated treatment well    Behavior During Therapy WFL for tasks assessed/performed                    Past Medical History:  Diagnosis Date   Arthritis    R shoulder, bone spur   Arthritis    "hands" (03/25/2016)   Chronic diastolic heart failure (HCC)    Chronic lower back pain    Coronary heart disease    Dr Jordan [name]   H/O cardiovascular stress test    perhaps last one was 2009   Heart murmur    12/29/11 echo: mild MR, no AS, trivial TR   History of gout    Hyperlipidemia    Hypertension    saw Dr. Linard [name] last earl;y- 2014, cardiac cath. last done ?2009, blocks seen didn't require any intervention at that point.   Narcolepsy    [name] 04-11-97; Mean Latency 1.48mn, SOREM 2   OSA on CPAP    NPSG 12-13-98 AHI 22.7   PONV (postoperative nausea and vomiting)    Presence of permanent cardiac pacemaker    Shortness of breath    with exertion    [place]    Type II diabetes mellitus ([place]    Past Surgical History:  Procedure Laterality Date   CARDIAC CATHETERIZATION  2009?   EP IMPLANTABLE DEVICE N/A 03/25/2016   Procedure: Pacemaker Implant - Dual Chamber;  Surgeon: GEvans Lance MD;  [facility];  Service: Cardiovascular;  Laterality: N/A;   FRACTURE SURGERY     INSERT / REPLACE / REMOVE  PACEMAKER  03/24/2016   INTRAMEDULLARY (IM) NAIL INTERTROCHANTERIC Left 02/08/2022   Procedure: INTRAMEDULLARY (IM) NAIL INTERTROCHANTRIC;  Surgeon: DMeredith Pel MD;  [facility]  Service: Orthopedics;  Laterality: Left;   IR KYPHO LUMBAR INC FX REDUCE BONE BX UNI/BIL CANNULATION INC/IMAGING  12/29/2021   IR RADIOLOGIST EVAL & MGMT  12/15/2021   LEFT AND RIGHT HEART CATHETERIZATION WITH CORONARY ANGIOGRAM N/A 06/05/2014   Procedure: LEFT AND RIGHT HEART CATHETERIZATION WITH CORONARY ANGIOGRAM;  Surgeon: HSinclair Grooms MD;  [facility];  Service: Cardiovascular;  Laterality: N/A;   NASAL FRACTURE SURGERY     SHOULDER ARTHROSCOPY WITH ROTATOR CUFF REPAIR AND SUBACROMIAL DECOMPRESSION Right 08/16/2013   Procedure: SHOULDER ARTHROSCOPY WITH ROTATOR CUFF REPAIR AND SUBACROMIAL DECOMPRESSION;  Surgeon: JNita Sells MD;  [place] [facility]  Service: Orthopedics;  Laterality: Right;  Right shoulder arthroscopy rotator cuff repair, subacromial decompression.   TONSILLECTOMY     Patient Active Problem List   Diagnosis Date Noted   Acute deep vein thrombosis (DVT) of left lower extremity (HCC) 04/29/2022   Closed intertrochanteric  fracture of left hip, initial encounter [facility])    Closed left hip fracture ([place]) secondary to fall at home 02/08/2022   Paroxysmal atrial fibrillation ([place]) 02/08/2022   Debility 01/08/2022   Constipation 01/07/2022   Physical deconditioning 80/88/1103   Acute metabolic encephalopathy 15/94/5859   Hypomagnesemia 01/04/2022   Pancreatitis 12/30/2021   Parkinson's disease ([place]) 12/21/2021   Parkinson disease ([place]) 12/18/2021   Frequent falls 12/18/2021   History of CVA (cerebrovascular accident) 12/18/2021   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   Hallucinations, visual 07/30/2021   Acetabulum fracture, left ([place]) 06/25/2021   Acute hip pain, left 06/24/2021   Fall at home, initial encounter 06/24/2021   Leukocytosis 06/24/2021   Chronic  diastolic heart failure ([place])    Sinusitis, acute maxillary 06/24/2020   AKI (acute kidney injury) ([place])    Labile blood glucose    Cough    Benign essential HTN    Skin lesion of hand    Hypoalbuminemia due to protein-calorie malnutrition ([place])    Controlled type 2 diabetes mellitus with hyperglycemia, without long-term current use of insulin (HCC)    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   Morbid obesity ([place])    Acute blood loss anemia    Acute thalamic infarction ([place]) 04/15/2020   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[place]) 05/25/2019   TIA (transient ischemic attack) 02/17/2018   Type 2 diabetes mellitus with hyperlipidemia ([place]) 02/17/2018   Insomnia 02/01/2018   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   Chronotropic incompetence 07/09/2014   Dyspnea on exertion 03/03/2014   HLD (hyperlipidemia) 12/13/2007   Essential hypertension 12/13/2007   Class 1 obesity 12/05/2007   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   Coronary atherosclerosis 12/05/2007    ONSET DATE: 04/19/22  REFERRING DIAG: thalamic CVA I63.81  THERAPY DIAG:  Abnormal posture  Unsteadiness on feet  Other lack of coordination  Attention and concentration deficit  Other abnormalities of gait and mobility  Muscle weakness (generalized)  Frontal lobe and executive function deficit  Rationale for Evaluation and Treatment Rehabilitation  SUBJECTIVE:   SUBJECTIVE STATEMENT: Pt reports continued back pain  Pt accompanied by:  self, wife   PERTINENT HISTORY: Jordan [name] is an 81 year old male with history of CAD/chronic diastolic CHF, OSA, Y9WK, gout, Parkinson's diease with recent hospital admission 02/17-02-22-23 worsening of gait disorder with falls with hallucinations which was treated with medication adjustment and he was discharged to home. He was readmitted on 12/30/21 with abdominal pain with low grade fever, leucocytosis, lactic acidosis, mild confusion and  lethargy secondary to sepsis. He was found to have elevated lipase-165  with inflammatory changes suggestive of primary pancreatitis with secondary inflammation of hepatic flexure of colon or primary sigmoid diverticulitis with secondary extension to pancreatic tail. He was treated with IVF and broad spectrum antibiotics.   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 Pt fell and sustained hip fx on 02/08/22 and pt underwent left  IM nailing.  04/30/22:  hospitalized with blood clot LLE--changed meds and cleared to resume OT/PT.   Pt accompanied by: self and significant other, spouse, [name]    PRECAUTIONS: Fall and Other: , hx of back kyphoplasty  , pacemaker, hx of L hip fx, s/p IM nailing, hx of shoulder dislocations, hx of LLE DVT  WEIGHT BEARING RESTRICTIONS No  PAIN:  Are you having pain? Yes, lower back (pain patch on) 0/10 at rest, fluctuates  w/ movement. O.T. addressing through posture, reaching ex's  PLOF: Independent prior to CVA in 2021  PATIENT GOALS to be more independent, pt's wife states he would like to be able to drive  OBJECTIVE:   TODAY'S TREATMENT:  Supine closed chain shoulder flexion, and chest press, PWR! Up! Min v.c for amplitude Sitting in w/c, therapist reviewed cane exercises as pt reports questions about HEP, 10-15 reps each, min v.c for positioning. Education provided regarding ways to prevent future PD complications and community resources-page 1 only, reviewed with patient     PATIENT EDUCATION: 06/03/22-ways to prevent future PD complications and community resources-page 1 only, reviewed with patient HOME EXERCISE PROGRAM: Coordination 05/10/22:  cane HEP (shoulder flex, chest press, and shoulder abduction); strategies for cutting food and donning shirt 05/17/22:  strategies for donning sock with sock aide, donning pants with reacher, and writing 06/03/22-ways to prevent future PD  complications and community resources-page 1 only, reviewed with patient    GOALS: Goals reviewed with patient? No  SHORT TERM GOALS: Target date: 05/14/22--extend to 05/27/22 due to missed visits  I with initial HEP Baseline: Goal status: IN PROGRESS, needs reinforcement  2.  Pt will verbalize understanding of adapted strategies to maximize safety and I with ADLs/ IADLs (JJ:OACZYSA food) Baseline:  Goal status: ongoing, education performed regarding cutting, food and dressing, pt. May benefit from reinforcement  3.  Pt will demonstrate improved UE functional use as evidenced by increasing LUE box/ blocks score by 3 blocks. Baseline: RUE 45, LUE 37 Goal status: 05/27/22:  ONGOING R-37blocks, L-39 blocks   4.  Pt will perform LB dressing with min A demonstrating good safety awareness. Baseline: mod-max A Goal status: PARTIALLY MET - extra time  5.  Pt will consistently donn pullover shirt mod I in a reasonable amount of time Baseline: increased time required and prn min assist Goal status: MET  6.   Pt will verbalize understanding of ways to prevent future PD related complications and PD community resources. Goal status: MET  7. Pt will retrieve items from overhead shelf at 95* with right UE with -15 elbow extension Baseline: 95, -20 Goal status: PARTIALLY MET.  115* with -20* elbow ext.  8. Pt will retrieve items from overhead shelf at 110 with left UE with -10 elbow extension Baseline: 110, -15 Goal status: Partially MET 05/27/22:  L-125* -15* elbow ext  LONG TERM GOALS: Target date: 07/15/2022    Pt will demonstrate understanding of memory compensations and ways to keep thinking skills sharp  Goal status: INITIAL  2.  Pt will demonstrate improved fine motor coordination for ADLs as evidenced by decreasing 9 hole peg test score for bilateral UE's by 3 secs Baseline: RUE 42.47, LUE 42.25 Goal status: INITIAL  3.  Pt will demonstrate improved ease with feeding as evidenced  by decreasing PPT#2 (self feeding) by 3 secs Baseline: 16.66 secs Goal status: INITIAL  4.  Pt will demonstrate improved ease with fastening buttons as evidenced by decreasing 3 button/ unbutton time to : 95 secs Baseline: 1 min 45 secs Goal status: INITIAL  5.  Pt will write a short paragraph with 100% legibility and no significant decrease in letter size Baseline: mild micrographia Goal status: INITIAL  6.  Pt will donn/ doff jacket in 2 mins and 30 secs or less in seated. Goal status: INITIAL   ASSESSMENT:  CLINICAL IMPRESSION: For the reporting period of  04/22/22- 06/03/22 Pt has achieved several short term goals however he has not  fully achieved goals due to the severity of his deficits.Pt is progressing towards remaining goals. Pt can benefit from continued skilled occupational therapy to address the following deficits: coordination, balance, bradykinesia, cognitive deficits, decreased ROM and decreased functional mobility in order to maximize pt's safety and I with ADLs/IADLs.  PERFORMANCE DEFICITS in functional skills including ADLs, IADLs, coordination, dexterity, sensation, ROM, strength, pain, flexibility, FMC, GMC, mobility, balance, endurance, decreased knowledge of precautions, decreased knowledge of use of DME, UE functional use, and bradykinesia, cognitive skills including attention, learn, memory, problem solving, safety awareness, and thought, and psychosocial skills including coping strategies, environmental adaptation, habits, interpersonal interactions, and routines and behaviors.   IMPAIRMENTS are limiting patient from ADLs, IADLs, rest and sleep, play, leisure, and social participation.   COMORBIDITIES may have co-morbidities  that affects occupational performance. Patient will benefit from skilled OT to address above impairments and improve overall function.  MODIFICATION OR ASSISTANCE TO COMPLETE EVALUATION: Min-Moderate modification of tasks or assist with assess  necessary to complete an evaluation.  OT OCCUPATIONAL PROFILE AND HISTORY: Detailed assessment: Review of records and additional review of physical, cognitive, psychosocial history related to current functional performance.  CLINICAL DECISION MAKING: Moderate - several treatment options, min-mod task modification necessary  REHAB POTENTIAL: Good  EVALUATION COMPLEXITY: Moderate    PLAN: OT FREQUENCY: 2x/week  OT DURATION: 12 weeks plus eval  PLANNED INTERVENTIONS: self care/ADL training, therapeutic exercise, therapeutic activity, neuromuscular re-education, manual therapy, passive range of motion, balance training, functional mobility training, aquatic therapy, ultrasound, paraffin, fluidotherapy, moist heat, cryotherapy, contrast bath, patient/family education, cognitive remediation/compensation, visual/perceptual remediation/compensation, energy conservation, coping strategies training, and DME and/or AE instructions  RECOMMENDED OTHER SERVICES: n/a  CONSULTED AND AGREED WITH PLAN OF CARE: Patient and family member/caregiver  PLAN FOR NEXT SESSION:  simulate LB dressing, ADL strategies   ,, OTR/L 06/03/2022, 1:32 PM  [phone] Phone: [phone] 1:32 PM 06/03/22

## 2022-06-03 NOTE — Therapy (Addendum)
OUTPATIENT SPEECH LANGUAGE PATHOLOGY TREATMENT NOTE   Patient Name: Jordan [name] MRN: [id number] DOB:Mar 07, 1941, 81 y.o., male Today's Date: 06/03/2022  PCP: [name] MD REFERRING PROVIDER: Izora Ribas, MD   END OF SESSION:   End of Session - 06/03/22 1314     Visit Number 11    Number of Visits 25    Date for SLP Re-Evaluation 07/16/22    Authorization Type [employer]  $20 co pay per day    SLP Start Time 1405    SLP Stop Time  1445    SLP Time Calculation (min) 40 min    Activity Tolerance Patient limited by fatigue;Patient limited by lethargy              Past Medical History:  Diagnosis Date   Arthritis    R shoulder, bone spur   Arthritis    "hands" (03/25/2016)   Chronic diastolic heart failure (HCC)    Chronic lower back pain    Coronary heart disease    Dr Pernell Dupre   H/O cardiovascular stress test    perhaps last one was 2009   Heart murmur    12/29/11 echo: mild MR, no AS, trivial TR   History of gout    Hyperlipidemia    Hypertension    saw Dr. Linard Millers last earl;y- 2014, cardiac cath. last done ?2009, blocks seen didn't require any intervention at that point.   Narcolepsy    [name] 04-11-97; Mean Latency 1.15mn, SOREM 2   OSA on CPAP    NPSG 12-13-98 AHI 22.7   PONV (postoperative nausea and vomiting)    Presence of permanent cardiac pacemaker    Shortness of breath    with exertion    Stroke ([place]    Type II diabetes mellitus ([place]    Past Surgical History:  Procedure Laterality Date   CARDIAC CATHETERIZATION  2009?   EP IMPLANTABLE DEVICE N/A 03/25/2016   Procedure: Pacemaker Implant - Dual Chamber;  Surgeon: GEvans Lance MD;  [facility];  Service: Cardiovascular;  Laterality: N/A;   FRACTURE SURGERY     INSERT / REPLACE / REMOVE PACEMAKER  03/24/2016   INTRAMEDULLARY (IM) NAIL INTERTROCHANTERIC Left 02/08/2022   Procedure: INTRAMEDULLARY (IM) NAIL INTERTROCHANTRIC;  Surgeon: DMeredith Pel MD;   [facility]  Service: Orthopedics;  Laterality: Left;   IR KYPHO LUMBAR INC FX REDUCE BONE BX UNI/BIL CANNULATION INC/IMAGING  12/29/2021   IR RADIOLOGIST EVAL & MGMT  12/15/2021   LEFT AND RIGHT HEART CATHETERIZATION WITH CORONARY ANGIOGRAM N/A 06/05/2014   Procedure: LEFT AND RIGHT HEART CATHETERIZATION WITH CORONARY ANGIOGRAM;  Surgeon: HSinclair Grooms MD;  [facility];  Service: Cardiovascular;  Laterality: N/A;   NASAL FRACTURE SURGERY     SHOULDER ARTHROSCOPY WITH ROTATOR CUFF REPAIR AND SUBACROMIAL DECOMPRESSION Right 08/16/2013   Procedure: SHOULDER ARTHROSCOPY WITH ROTATOR CUFF REPAIR AND SUBACROMIAL DECOMPRESSION;  Surgeon: JNita Sells MD;  [place] [facility]  Service: Orthopedics;  Laterality: Right;  Right shoulder arthroscopy rotator cuff repair, subacromial decompression.   TONSILLECTOMY     Patient Active Problem List   Diagnosis Date Noted   Acute deep vein thrombosis (DVT) of left lower extremity ([place] 04/29/2022   Closed intertrochanteric fracture of left hip, initial encounter ([facility]    Closed left hip fracture ([place] secondary to fall at home 02/08/2022   Paroxysmal atrial fibrillation ([place] 02/08/2022   Debility 01/08/2022   Constipation 01/07/2022  Physical deconditioning 79/89/2119   Acute metabolic encephalopathy 41/74/0814   Hypomagnesemia 01/04/2022   Pancreatitis 12/30/2021   Parkinson's disease ([place]) 12/21/2021   Parkinson disease ([place]) 12/18/2021   Frequent falls 12/18/2021   History of CVA (cerebrovascular accident) 12/18/2021   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   Hallucinations, visual 07/30/2021   Acetabulum fracture, left ([place]) 06/25/2021   Acute hip pain, left 06/24/2021   Fall at home, initial encounter 06/24/2021   Leukocytosis 06/24/2021   Chronic diastolic heart failure ([place])    Sinusitis, acute maxillary 06/24/2020   AKI (acute kidney injury) ([place])    Labile blood glucose    Cough    Benign essential HTN    Skin  lesion of hand    Hypoalbuminemia due to protein-calorie malnutrition ([place])    Controlled type 2 diabetes mellitus with hyperglycemia, without long-term current use of insulin (HCC)    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   Morbid obesity ([place])    Acute blood loss anemia    Acute thalamic infarction ([place]) 04/15/2020   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[place]) 05/25/2019   TIA (transient ischemic attack) 02/17/2018   Type 2 diabetes mellitus with hyperlipidemia ([place]) 02/17/2018   Insomnia 02/01/2018   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   Chronotropic incompetence 07/09/2014   Dyspnea on exertion 03/03/2014   HLD (hyperlipidemia) 12/13/2007   Essential hypertension 12/13/2007   Class 1 obesity 12/05/2007   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 No  OBJECTIVE:     TODAY'S TREATMENT:  06-03-22: Pt entered with suboptimal volume (mid 60s dB), in which pt able to correct to low 70s with initial prompt. Conversational volume declined over 1-2 minutes of conversation with no self-recognition. HEP completed x1 since last ST session (nothing marked in book). Targeted improving vocal quality and increasing intensity through progressively difficulty speech tasks using Speak Out! program, lesson 13. ST leads pt through exercises providing usual model prior to pt execution. Usual mod-A required for accurate completion of tasks and to meet listed average dBs. Loud ah: 82 dB; counting 78 dB; reading paragraphs 73 dB. Fatigue and reduced effort  impacted vocal intensity and quality during structured practice, with break ultimately provided. Education provided re: throat clear alternatives and need to practice speaking with intent at home, especially in conversation.   05-31-22: Pt entered with inconsistent conversational volume (mid 60s to low 70s dB) with intermittent cues. Ongoing confusion and reduced recall exhibited for accurate completion of Speak Out! Program despite use of written cues, modeling, and extensive re-education. SLP modified and simplified tasks with sole focus on loudness (versus duration, breath support). Trained use of effort scale to optimize carryover as pt appears to have difficult time perceiving and recognizing targeted vocal intensity. Usual mod-A required for accurate completion of tasks and to meet listed average dBs. Loud ah: 88 dB; counting 77 dB; reading paragraphs 72 dB; cognitive task 70 dB. Subsequent conversation of 5 minutes required usual min to mod A to maintain conversational volume with use of visual aids.   05-27-22: Reports "slow" progress on speech exercise. Continues to present with some questions regarding structure  and purpose of targeted exercises. SLP answers all questions and provides usual models of all exercises to aid in successful completion of tasks. Usual mod-A required for accurate completion of tasks and to meet listed average dBs. Loud ah: 88 dB; counting 76 dB; reading paragraphs 74 dB; cognitive task 69 dB. Some intermittent hoarseness observed which was able to clear with mod-A for intentional speech.   05-24-22: Pt enters with low 70's to mid 60's dB. Targeted improving vocal quality and increasing intensity through progressively difficulty speech tasks using Speak Out! program, lesson 11. ST leads pt through exercises providing frequent model prior to pt execution. Usual mod-A required to achieve target dB this date. Averages this date: loud "ah" 90 dB; reading 84 dB; cognitive speech task  73 dB. Effective pausing to formulate thoughts exhibited. Conversational sample of approx 3 minutes, pt averaged upper 67 dB with occasional to usual in-A to utilize intentional speech. He required clarification that sustained loud /a/ should be consistent pitch as he was gradually increasing pitch during /a/. Spouse present. Modeled cueing and instructed her the exercises should be done twice a day. Showed where to access online practice.  PATIENT EDUCATION: Education details: see above Person educated: Patient and Spouse Education method: Explanation, Demonstration, and Handouts Education comprehension: verbalized understanding, returned demonstration, and needs further education     HOME EXERCISE PROGRAM: Speak Out daily     GOALS: Goals reviewed with patient? Yes   SHORT TERM GOALS: Target date: 05/19/2022     Pt will complete Speak Out! HEP at least 1x/day given occasional min A over 2 sessions  Baseline: 05-17-22 Goal status: Met   2.  Pt will achieve targeted dB (85-90 dB) on warm up exercises with 80% accuracy given occasional min A over 2 sessions  Baseline:  Goal status: Partially Met   3.  Pt will achieve targeted dB (75-85 dB) on reading exercises with 80% accuracy given occasional min A over 2 sessions Baseline: 05-10-22, 05-17-22 Goal status: Met   4.  Pt will achieve targeted dB (72-78 dB) on cognitive exercises with 80% accuracy given occasional min A over 2 sessions Baseline: 05-10-22, 7-11-07-21 Goal status: Met   5.  Pt will utilize dysarthria compensations in 5-10 minute conversation to optimize vocal intensity and clarity given occasional mod A over 2 sessions  Baseline:  Goal status: Partially Met   6.  Pt will implement memory/attention compensations to aid daily functioning given occasional mod A over 2 sessions  Baseline:  Goal status: Partially Met   LONG TERM GOALS: Target date: 07/16/2022    Pt will complete Speak Out! HEP at least 1x/day (BID  recommended) > 1 week  Baseline:  Goal status: ongoing   2.  Pt will achieve targeted dB levels in demonstration of Speak Out! Lessons with 90% accuracy given rare min A over 2 sessions  Baseline:  Goal status: ongoing   3.  Pt will utilize dysarthria compensations in 15+ minute conversation to optimize vocal intensity and clarity given occasional min A over 2 sessions Baseline:  Goal status: ongoing   4.  Pt will implement memory/attention compensations to aid daily functioning given occasional min A over 2 sessions  Baseline:  Goal status: ongoing   5.  Pt will report improved communication effectiveness via PROM by 2 point improvement by last ST session  Baseline: CPIB=26 Goal status: ongoing   ASSESSMENT:   CLINICAL IMPRESSION: Patient is a 81 y.o. male who was seen today for previous thalamic  stroke and Parkinson's disease. 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 Conducted ongoing education and training of Speak Out! Program to optimize vocal intensity and clarity. Usual modeling and cues required to aid patient comprehension and implementation of targeted techniques to maximize speech intelligibility. Skilled ST is warranted to address dysarthria and cognition to maximize communication effectiveness and functional independence.    OBJECTIVE IMPAIRMENTS  Objective impairments include attention, memory, and dysarthria. These impairments are limiting patient from household responsibilities and effectively communicating at home and in community.Factors affecting potential to achieve goals and functional outcome are medical prognosis and previous level of function. Patient will benefit from skilled SLP services to address above impairments and improve overall function.   REHAB POTENTIAL: Good   PLAN: SLP FREQUENCY: 2x/week   SLP DURATION: 12 weeks   PLANNED INTERVENTIONS:  Cueing hierachy, Cognitive reorganization, Internal/external aids, Functional tasks, Multimodal communication approach, SLP instruction and feedback, Compensatory strategies, and Patient/family education  Marzetta Board, CCC-SLP 06/03/2022, 3:05 PM

## 2022-06-03 NOTE — Therapy (Signed)
OUTPATIENT PHYSICAL THERAPY TREATMENT NOTE- ARRIVED NO CHARGE   Patient Name: Jordan [name] MRN: [id number] DOB:[birth year], 81 y.o., male Today's Date: 06/03/2022  PCP: [name], MD REFERRING PROVIDER: [name], MD  END OF SESSION:   PT End of Session - 06/03/22 1509     Visit Number 8    Number of Visits 17    Date for PT Re-Evaluation 07/21/22    Authorization Type [employer] Medicare    PT Start Time 1447    PT Stop Time 1456    PT Time Calculation (min) 9 min    Activity Tolerance Other (comment)   Pt unable to cmmunicate well, stated he did not feel good and did not want to participate in therapy   Behavior During Therapy Flat affect                Past Medical History:  Diagnosis Date   Arthritis    R shoulder, bone spur   Arthritis    "hands" (03/25/2016)   Chronic diastolic heart failure (HCC)    Chronic lower back pain    Coronary heart disease    Dr Pernell Dupre   H/O cardiovascular stress test    perhaps last one was 2009   Heart murmur    12/29/11 echo: mild MR, no AS, trivial TR   History of gout    Hyperlipidemia    Hypertension    saw Dr. Linard Millers last earl;y- 2014, cardiac cath. last done ?2009, blocks seen didn't require any intervention at that point.   Narcolepsy    [name] 04-11-97; Mean Latency 1.28mn, SOREM 2   OSA on CPAP    NPSG 12-13-98 AHI 22.7   PONV (postoperative nausea and vomiting)    Presence of permanent cardiac pacemaker    Shortness of breath    with exertion    Stroke ([place]    Type II diabetes mellitus ([place]    Past Surgical History:  Procedure Laterality Date   CARDIAC CATHETERIZATION  2009?   EP IMPLANTABLE DEVICE N/A 03/25/2016   Procedure: Pacemaker Implant - Dual Chamber;  Surgeon: GEvans Lance MD;  [facility];  Service: Cardiovascular;  Laterality: N/A;   FRACTURE SURGERY     INSERT / REPLACE / REMOVE PACEMAKER  03/24/2016   INTRAMEDULLARY (IM) NAIL INTERTROCHANTERIC Left 02/08/2022    Procedure: INTRAMEDULLARY (IM) NAIL INTERTROCHANTRIC;  Surgeon: DMeredith Pel MD;  [facility]  Service: Orthopedics;  Laterality: Left;   IR KYPHO LUMBAR INC FX REDUCE BONE BX UNI/BIL CANNULATION INC/IMAGING  12/29/2021   IR RADIOLOGIST EVAL & MGMT  12/15/2021   LEFT AND RIGHT HEART CATHETERIZATION WITH CORONARY ANGIOGRAM N/A 06/05/2014   Procedure: LEFT AND RIGHT HEART CATHETERIZATION WITH CORONARY ANGIOGRAM;  Surgeon: HSinclair Grooms MD;  [facility];  Service: Cardiovascular;  Laterality: N/A;   NASAL FRACTURE SURGERY     SHOULDER ARTHROSCOPY WITH ROTATOR CUFF REPAIR AND SUBACROMIAL DECOMPRESSION Right 08/16/2013   Procedure: SHOULDER ARTHROSCOPY WITH ROTATOR CUFF REPAIR AND SUBACROMIAL DECOMPRESSION;  Surgeon: JNita Sells MD;  [place]  Service: Orthopedics;  Laterality: Right;  Right shoulder arthroscopy rotator cuff repair, subacromial decompression.   TONSILLECTOMY     Patient Active Problem List   Diagnosis Date Noted   Acute deep vein thrombosis (DVT) of left lower extremity ([place] 04/29/2022   Closed intertrochanteric fracture of left hip, initial encounter ([place]    Closed left hip fracture ([place] secondary to fall at  home 02/08/2022   Paroxysmal atrial fibrillation ([place]) 02/08/2022   Debility 01/08/2022   Constipation 01/07/2022   Physical deconditioning 01/60/1093   Acute metabolic encephalopathy 23/55/7322   Hypomagnesemia 01/04/2022   Pancreatitis 12/30/2021   Parkinson's disease ([place]) 12/21/2021   Parkinson disease ([place]) 12/18/2021   Frequent falls 12/18/2021   History of CVA (cerebrovascular accident) 12/18/2021   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   Hallucinations, visual 07/30/2021   Acetabulum fracture, left ([place]) 06/25/2021   Acute hip pain, left 06/24/2021   Fall at home, initial encounter 06/24/2021   Leukocytosis 06/24/2021   Chronic diastolic heart failure ([place])    Sinusitis, acute maxillary 06/24/2020   AKI (acute  kidney injury) ([place])    Labile blood glucose    Cough    Benign essential HTN    Skin lesion of hand    Hypoalbuminemia due to protein-calorie malnutrition ([place])    Controlled type 2 diabetes mellitus with hyperglycemia, without long-term current use of insulin (HCC)    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   Morbid obesity ([place])    Acute blood loss anemia    Acute thalamic infarction ([place]) 04/15/2020   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[place]) 05/25/2019   TIA (transient ischemic attack) 02/17/2018   Type 2 diabetes mellitus with hyperlipidemia ([place]) 02/17/2018   Insomnia 02/01/2018   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   Chronotropic incompetence 07/09/2014   Dyspnea on exertion 03/03/2014   HLD (hyperlipidemia) 12/13/2007   Essential hypertension 12/13/2007   Class 1 obesity 12/05/2007   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   Coronary atherosclerosis 12/05/2007    REFERRING DIAG: I63.81 (ICD-10-CM) - 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[place]) G20 (ICD-10-CM) - Parkinson's disease ([place]) S72.145S (ICD-10-CM) - Type III open nondisplaced intertrochanteric fracture of left femur, sequela    THERAPY DIAG:  Other abnormalities of gait and mobility  Rationale for Evaluation and Treatment Rehabilitation  PERTINENT HISTORY: PMHx: PD (diagnosed Jan 2022), HLD, OSA, DM, HTN, chronic diastolic HF, CVA, frequent falls, L2 compression fx, L hip fx   PRECAUTIONS: Fall and ICD/Pacemaker, history of compression fracture at L2 with recent kyphoplasty   SUBJECTIVE: Pt presented to PT very pallor and stated he did not feel well but could not communicate well. Therapist obtained wife to see if pt would speak to her, but pt continued to perseverate on not feeling well and not knowing why. Assessed vitals and BP low (normal for pt). Educated pt to drink fluids (water specifically, as pt only likes soda) and if he begins to experience symptoms of TIA/Stroke to go to ED. Pt and wife  verbalized understanding.    VITALS Vitals:   06/03/22 1509  BP: 106/62  Pulse: 72     PAIN:  Are you having pain? No  OBJECTIVE  TODAY'S TREATMENT:  Arrived no charge      PATIENT EDUCATION: Education details: see subjective  Person educated: Patient and Spouse Education method: Explanation Education comprehension: verbalized understanding     HOME EXERCISE PROGRAM: Access Code: [email or web address] URL: [email or web address] Date: 04/23/2022 Prepared by: Estevan Ryder  Exercises - Clamshell  - 1 x daily - 7 x weekly - 3 sets - 10 reps - Seated Scapular Retraction  - 1 x daily - 7 x weekly - 3 sets - 10 reps - Seated Long Arc Quad  - 1 x daily - 7 x weekly - 3 sets - 10 reps - Seated Hamstring Stretch  - 1 x daily - 7 x weekly - 3 sets - 10 reps -  Sit to Stand  - 1 x daily - 7 x weekly - 3 sets - 10 reps - Seated Heel Raise  - 1 x daily - 7 x weekly - 3 sets - 10 reps - Seated March  - 1 x daily - 7 x weekly - 3 sets - 10 reps - Seated Flexion Stretch with Swiss Ball  - 1 x daily - 7 x weekly - 3 sets - 10 reps          GOALS: Goals reviewed with patient? Yes   SHORT TERM GOALS: Target date: 05/19/2022   Pt will be able to perform initial HEP for strengthening and balance to continue gains with supervision of wife. Baseline: Goal status: MET   2.  BERG to be assessed with STG/LTG written.  Baseline: 20/56 Goal status: MET   3.  Pt will improve 5x sit<>stand to less than or equal to 19 sec without UE support to demonstrate improved functional strength and transfer efficiency.   Baseline: 22.6 seconds without UE support from chair; 21seconds without UE support  Goal status: NOT MET   4.  Pt will ambulate >350' with RW mod I for improved household and short community distances. Baseline: requires CGA/MinA/ limited to 230' Goal status: NOT MET   5.  Pt will improve gait speed with RW to at least 1.3 ft/sec in order to demo improved community  mobility.    Baseline:  .87 ft/sec with RW; .32f/s (0.351m) Goal status: NOT MET       LONG TERM GOALS: Target date: 06/16/2022     Pt will be independent with final HEP for strength,gait, balance in order to build upon functional gains made in therapy.   Baseline:  Goal status: INITIAL   2.  Patient will score >25/56 on Berg to demonstrate improved balance Baseline: 20/56 Goal status: REVISED   3.  Pt will improve TUG time with RW vs. LRAD to 26 seconds or less in order to demo decrease fall risk.   Baseline: 33.32 seconds with RW  Goal status: INITIAL   4.  Pt will improve gait speed with RW vs. LRAD to at least 1.9 ft/sec in order to demo improved community mobility.  Baseline: .87 ft/sec with RW Goal status: INITIAL   5.  Pt will ambulate >350' with RW versus LRAD on paved surfaces with supervision for improved community mobility. Baseline: 230' Goal status: REVISED     ASSESSMENT:   CLINICAL IMPRESSION: Arrived no charge     OBJECTIVE IMPAIRMENTS Abnormal gait, decreased activity tolerance, decreased balance, decreased coordination, decreased endurance, decreased knowledge of use of DME, decreased mobility, decreased ROM, decreased strength, impaired flexibility, postural dysfunction, and pain.    ACTIVITY LIMITATIONS cleaning, community activity, driving, meal prep, laundry, yard work, and shopping.    PERSONAL FACTORS Age, Past/current experiences, Time since onset of injury/illness/exacerbation, and 3+ comorbidities:  PD, HLD, OSA, DM, HTN, chronic diastolic HF, CVA, frequent falls, L2 compression fx, L hip fx   are also affecting patient's functional outcome.      REHAB POTENTIAL: Good   CLINICAL DECISION MAKING: Evolving/moderate complexity   EVALUATION COMPLEXITY: Moderate   PLAN: PT FREQUENCY: 2x/week   PT DURATION: 12 weeks, plus eval   PLANNED INTERVENTIONS: Therapeutic exercises, Therapeutic activity, Neuromuscular re-education, Balance  training, Gait training, Patient/Family education, Joint mobilization, Stair training, Vestibular training, Canalith repositioning, DME instructions, and Manual therapy   PLAN FOR NEXT SESSION:  posture, functional strength/endurance, add balance to HEP, gait training,  weight shifting to L  Kiernan Atkerson E Yuta Cip[place]one, PT, DPT 06/03/2022, 3:10 PM

## 2022-06-04 ENCOUNTER — Ambulatory Visit (INDEPENDENT_AMBULATORY_CARE_PROVIDER_SITE_OTHER): Payer: Medicare (Managed Care) | Admitting: Internal Medicine

## 2022-06-04 ENCOUNTER — Encounter: Payer: Self-pay | Admitting: Internal Medicine

## 2022-06-04 VITALS — BP 100/60 | HR 70 | Ht 69.0 in | Wt 219.0 lb

## 2022-06-04 DIAGNOSIS — I10 Essential (primary) hypertension: Secondary | ICD-10-CM | POA: Diagnosis not present

## 2022-06-04 DIAGNOSIS — I5032 Chronic diastolic (congestive) heart failure: Secondary | ICD-10-CM | POA: Diagnosis not present

## 2022-06-04 DIAGNOSIS — I480 Paroxysmal atrial fibrillation: Secondary | ICD-10-CM | POA: Diagnosis not present

## 2022-06-04 DIAGNOSIS — I495 Sick sinus syndrome: Secondary | ICD-10-CM | POA: Diagnosis not present

## 2022-06-04 NOTE — Patient Instructions (Addendum)
Medication Instructions:  Your physician recommends that you continue on your current medications as directed. Please refer to the Current Medication list given to you today.  *If you need a refill on your cardiac medications before your next appointment, please call your pharmacy* ( [facility]. )  Lab Work: None ordered.  If you have labs (blood work) drawn today and your tests are completely normal, you will receive your results only by: [place] (if you have MyChart) OR A paper copy in the mail If you have any lab test that is abnormal or we need to change your treatment, we will call you to review the results.  Testing/Procedures: None ordered.  Follow-Up: At [facility], you and your health needs are our priority.  As part of our continuing mission to provide you with exceptional heart care, we have created designated Provider Care Teams.  These Care Teams include your primary Cardiologist (physician) and Advanced Practice Providers (APPs -  Physician Assistants and Nurse Practitioners) who all work together to provide you with the care you need, when you need it.  We recommend signing up for the patient portal called "MyChart".  Sign up information is provided on this After Visit Summary.  MyChart is used to connect with patients for Virtual Visits (Telemedicine).  Patients are able to view lab/test results, encounter notes, upcoming appointments, etc.  Non-urgent messages can be sent to your provider as well.   To learn more about what you can do with MyChart, go to [email or web address].    Your next appointment:   1 year(s)  The format for your next appointment:   In Person  Provider:   Cristopher Peru, MD{or one of the following Advanced Practice Providers on your designated Care Team:   Tommye Standard, [place] Legrand Como "Jonni Sanger" Chalmers Cater, [place]  Remote monitoring is used to monitor your Pacemaker from home. This monitoring  reduces the number of office visits required to check your device to one time per year. It allows [place] to keep an eye on the functioning of your device to ensure it is working properly. You are scheduled for a device check from home on 07/29/22. You may send your transmission at any time that day. If you have a wireless device, the transmission will be sent automatically. After your physician reviews your transmission, you will receive a postcard with your next transmission date.  Important Information About Sugar

## 2022-06-04 NOTE — Progress Notes (Signed)
HPI [name] returns today for followup over 3 years from his most recent visit. He is a pleasant 81 yo man with a h/o chronotropic incompetence, s/p PPM insertion. He has HTN and obesity. He has been unable to lose any weight. He has been diagnosed with parkinson's and is on sinemet. He has had some spells but has not injured himself. No syncope but he gets lightheaded and has fallen.   No Known Allergies   Current Outpatient Medications  Medication Sig Dispense Refill   amantadine (SYMMETREL) 100 MG capsule Take 2 capsules (200 mg total) by mouth daily. 180 capsule 3   atorvastatin (LIPITOR) 40 MG tablet Take 0.5 tablets (20 mg total) by mouth daily. 30 tablet 0   bifidobacterium infantis (ALIGN) capsule Take 1 capsule by mouth daily.     Camphor-Menthol-Methyl Sal (HM SALONPAS PAIN RELIEF EX) Apply 1 patch topically daily as needed (pain).     Carbidopa-Levodopa ER (SINEMET CR) 25-100 MG tablet controlled release TAKE 1 TABLET BY MOUTH EVERYDAY AT BEDTIME (Patient taking differently: Take 1 tablet by mouth at bedtime.) 90 tablet 1   cholecalciferol (VITAMIN D3) 25 MCG (1000 UNIT) tablet Take 1,000 Units by mouth daily.     docusate sodium (COLACE) 100 MG capsule Take 1 capsule (100 mg total) by mouth 2 (two) times daily. 10 capsule 0   Ensure Plus (ENSURE PLUS) LIQD Take 237 mLs by mouth every other day.     furosemide (LASIX) 20 MG tablet USE AS NEEDED FOR UP TO 5 DAYS IF SWELLING IN LEGS RECURS. CONTACT CARDIOLOGY IS SWELLING DOES NOT IMPROVE OR GETS WORSE. 15 tablet 0   lidocaine (LIDODERM) 5 % Place 1 patch onto the skin daily as needed. Purchase over the counter. On for 12 hours and off for 12 hours 30 patch 0   magnesium gluconate (MAGONATE) 500 MG tablet Take 0.5 tablets (250 mg total) by mouth at bedtime. 30 tablet 0   Melatonin 10 MG TABS Take 10 mg by mouth at bedtime.     metFORMIN (GLUCOPHAGE) 1000 MG tablet Take 1 tablet (1,000 mg total) by mouth daily with breakfast.  (Patient taking differently: Take 500 mg by mouth daily with breakfast.) 30 tablet 0   methocarbamol (ROBAXIN) 500 MG tablet Take 1 tablet (500 mg total) by mouth every 8 (eight) hours as needed for muscle spasms. 30 tablet 0   metoprolol tartrate (LOPRESSOR) 25 MG tablet Take 1 tablet (25 mg total) by mouth 2 (two) times daily. 60 tablet 0   modafinil (PROVIGIL) 100 MG tablet Take 1 tablet (100 mg total) by mouth daily. 90 tablet 3   Multiple Vitamin (MULTIVITAMIN WITH MINERALS) TABS tablet Take 1 tablet by mouth daily.     NITROSTAT 0.4 MG SL tablet PLACE 1 TABLET (0.4 MG TOTAL) UNDER THE TONGUE EVERY 5 (FIVE) MINUTES AS NEEDED FOR CHEST PAIN. (Patient taking differently: Place 0.4 mg under the tongue every 5 (five) minutes as needed for chest pain.) 25 tablet 5   Omega 3 1200 MG CAPS Take 1,200 mg by mouth daily.     polyethylene glycol (MIRALAX / GLYCOLAX) 17 g packet Take 17 g by mouth daily. 14 each 0   polyvinyl alcohol (LIQUIFILM TEARS) 1.4 % ophthalmic solution Place 1 drop into both eyes as needed for dry eyes. 15 mL 0   QUEtiapine (SEROQUEL) 25 MG tablet TAKE 1/2 TABLETS (12.5 MG TOTAL) BY MOUTH AT BEDTIME. 45 tablet 1   RIVAROXABAN (XARELTO) VTE  STARTER PACK (15 & 20 MG) Follow package directions: Take one '15mg'$  tablet by mouth twice a day. On day 22, switch to one '20mg'$  tablet once a day. Take with food. 51 each 0   Vitamin D, Ergocalciferol, (DRISDOL) 1.25 MG (50000 UNIT) CAPS capsule TAKE 1 CAPSULE (50,000 UNITS TOTAL) BY MOUTH EVERY 7 (SEVEN) DAYS 5 capsule 0   Wheat Dextrin (BENEFIBER PO) Take 10 mLs by mouth daily.     No current facility-administered medications for this visit.     Past Medical History:  Diagnosis Date   Arthritis    R shoulder, bone spur   Arthritis    "hands" (03/25/2016)   Chronic diastolic heart failure (HCC)    Chronic lower back pain    Coronary heart disease    Dr Pernell Dupre   H/O cardiovascular stress test    perhaps last one was 2009   Heart  murmur    12/29/11 echo: mild MR, no AS, trivial TR   History of gout    Hyperlipidemia    Hypertension    saw Dr. Linard Millers last earl;y- 2014, cardiac cath. last done ?2009, blocks seen didn't require any intervention at that point.   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   Past Surgical History:  Procedure Laterality Date   CARDIAC CATHETERIZATION  2009?   EP IMPLANTABLE DEVICE N/A 03/25/2016   Procedure: Pacemaker Implant - Dual Chamber;  Surgeon: GEvans Lance MD;  [facility];  Service: Cardiovascular;  Laterality: N/A;   FRACTURE SURGERY     INSERT / REPLACE / REMOVE PACEMAKER  03/24/2016   INTRAMEDULLARY (IM) NAIL INTERTROCHANTERIC Left 02/08/2022   Procedure: INTRAMEDULLARY (IM) NAIL INTERTROCHANTRIC;  Surgeon: DMeredith Pel MD;  [place]: [facility]  Service: Orthopedics;  Laterality: Left;   IR KYPHO LUMBAR INC FX REDUCE BONE BX UNI/BIL CANNULATION INC/IMAGING  12/29/2021   IR RADIOLOGIST EVAL & MGMT  12/15/2021   LEFT AND RIGHT HEART CATHETERIZATION WITH CORONARY ANGIOGRAM N/A 06/05/2014   Procedure: LEFT AND RIGHT HEART CATHETERIZATION WITH CORONARY ANGIOGRAM;  Surgeon: HSinclair Grooms MD;  [facility];  Service: Cardiovascular;  Laterality: N/A;   NASAL FRACTURE SURGERY     SHOULDER ARTHROSCOPY WITH ROTATOR CUFF REPAIR AND SUBACROMIAL DECOMPRESSION Right 08/16/2013   Procedure: SHOULDER ARTHROSCOPY WITH ROTATOR CUFF REPAIR AND SUBACROMIAL DECOMPRESSION;  Surgeon: JNita Sells MD;  [place] [facility]  Service: Orthopedics;  Laterality: Right;  Right shoulder arthroscopy rotator cuff repair, subacromial decompression.    TONSILLECTOMY       Family History  Problem Relation Age of Onset   Sleep apnea Sister    Colon cancer Sister      Social History   Socioeconomic History   Marital status: Married    Spouse name: KClaiborne Billings  Number of children: Not on file   Years of education: Not on file   Highest education level: Not on file  Occupational History   Occupation: Insurance [employer]  Occupation: [employer]  Tobacco Use   Smoking status:  Former    Packs/day: 2.50    Years: 6.00    Total pack years: 15.00    Types: Cigarettes    Quit date: 06/05/1967    Years since quitting: 55.0   Smokeless tobacco: Never  Vaping Use   Vaping Use: Never used  Substance and Sexual Activity   Alcohol use: Yes    Comment: rare   Drug use: No   Sexual activity: Not Currently  Other Topics Concern   Not on file  Social History Narrative   Lives with wife and son   Right handed   Drinks 1 cups caffeine daily   Social Determinants of Health   Financial Resource Strain: Not on file  Food Insecurity: Not on file  Transportation Needs: Not on file  Physical Activity: Not on file  Stress: Not on file  Social Connections: Not on file  Intimate Partner Violence: Not on file     Ht '5\' 9"'$  (1.753 m)   Wt 219 lb (99.3 kg)   BMI 32.34 kg/m   Physical Exam:  Well appearing NAD HEENT: Unremarkable Neck:  No JVD, no thyromegally Lymphatics:  No adenopathy Back:  No CVA tenderness Lungs:  Clear with no wheezes HEART:  Regular rate rhythm, no murmurs, no rubs, no clicks Abd:  soft, positive bowel sounds, no organomegally, no rebound, no guarding Ext:  2 plus pulses, no edema, no cyanosis, no clubbing Skin:  No rashes no nodules Neuro:  CN II through XII intact, motor grossly intact  DEVICE  Normal device function.  See PaceArt for details.   Assess/Plan:  1. Sinus node dysfunction - he is better, s/p PPM insertion. 2. PPM - his Biotronik DDD PM is working normally.  3. HTN - his blood  pressure is well controlled. No change in meds.   Mikle Bosworth.D.

## 2022-06-07 ENCOUNTER — Ambulatory Visit: Payer: Medicare (Managed Care)

## 2022-06-07 ENCOUNTER — Ambulatory Visit: Payer: Medicare (Managed Care) | Admitting: Physical Therapy

## 2022-06-07 ENCOUNTER — Ambulatory Visit: Payer: Medicare (Managed Care) | Admitting: Occupational Therapy

## 2022-06-07 ENCOUNTER — Encounter: Payer: Self-pay | Admitting: Physical Therapy

## 2022-06-07 VITALS — BP 113/68 | HR 63

## 2022-06-07 DIAGNOSIS — R2681 Unsteadiness on feet: Secondary | ICD-10-CM

## 2022-06-07 DIAGNOSIS — R2689 Other abnormalities of gait and mobility: Secondary | ICD-10-CM

## 2022-06-07 DIAGNOSIS — M9904 Segmental and somatic dysfunction of sacral region: Secondary | ICD-10-CM | POA: Diagnosis not present

## 2022-06-07 DIAGNOSIS — H8111 Benign paroxysmal vertigo, right ear: Secondary | ICD-10-CM

## 2022-06-07 DIAGNOSIS — R293 Abnormal posture: Secondary | ICD-10-CM

## 2022-06-07 DIAGNOSIS — R471 Dysarthria and anarthria: Secondary | ICD-10-CM

## 2022-06-07 DIAGNOSIS — R41840 Attention and concentration deficit: Secondary | ICD-10-CM

## 2022-06-07 DIAGNOSIS — M9903 Segmental and somatic dysfunction of lumbar region: Secondary | ICD-10-CM | POA: Diagnosis not present

## 2022-06-07 DIAGNOSIS — R41844 Frontal lobe and executive function deficit: Secondary | ICD-10-CM

## 2022-06-07 DIAGNOSIS — M6281 Muscle weakness (generalized): Secondary | ICD-10-CM

## 2022-06-07 DIAGNOSIS — M5136 Other intervertebral disc degeneration, lumbar region: Secondary | ICD-10-CM | POA: Diagnosis not present

## 2022-06-07 DIAGNOSIS — M9905 Segmental and somatic dysfunction of pelvic region: Secondary | ICD-10-CM | POA: Diagnosis not present

## 2022-06-07 DIAGNOSIS — R278 Other lack of coordination: Secondary | ICD-10-CM

## 2022-06-07 DIAGNOSIS — R41841 Cognitive communication deficit: Secondary | ICD-10-CM

## 2022-06-07 LAB — CUP PACEART INCLINIC DEVICE CHECK
Date Time Interrogation Session: 20230804144834
Implantable Lead Implant Date: 20170525
Implantable Lead Implant Date: 20170525
Implantable Lead Location: 753859
Implantable Lead Location: 753860
Implantable Lead Model: 377
Implantable Lead Model: 377
Implantable Lead Serial Number: 49470224
Implantable Lead Serial Number: 49485613
Implantable Pulse Generator Implant Date: 20170525
Pulse Gen Model: 394969
Pulse Gen Serial Number: 68798589

## 2022-06-07 NOTE — Therapy (Signed)
OUTPATIENT SPEECH LANGUAGE PATHOLOGY TREATMENT NOTE   Patient Name: Jordan [name] MRN: [id number] DOB:January 03, 1941, 81 y.o., male Today's Date: 06/07/2022  PCP: [name] MD REFERRING PROVIDER: Izora Ribas, MD   END OF SESSION:   End of Session - 06/07/22 1214     Visit Number 12    Number of Visits 25    Date for SLP Re-Evaluation 07/16/22    Authorization Type [employer]  $20 co pay per day    SLP Start Time 1316    SLP Stop Time  1403    SLP Time Calculation (min) 47 min    Activity Tolerance Patient tolerated treatment well               Past Medical History:  Diagnosis Date   Arthritis    R shoulder, bone spur   Arthritis    "hands" (03/25/2016)   Chronic diastolic heart failure (HCC)    Chronic lower back pain    Coronary heart disease    Dr Pernell Dupre   H/O cardiovascular stress test    perhaps last one was 2009   Heart murmur    12/29/11 echo: mild MR, no AS, trivial TR   History of gout    Hyperlipidemia    Hypertension    saw Dr. Linard Millers last earl;y- 2014, cardiac cath. last done ?2009, blocks seen didn't require any intervention at that point.   Narcolepsy    [name] 04-11-97; Mean Latency 1.44mn, SOREM 2   OSA on CPAP    NPSG 12-13-98 AHI 22.7   PONV (postoperative nausea and vomiting)    Presence of permanent cardiac pacemaker    Shortness of breath    with exertion    Stroke ([place]    Type II diabetes mellitus ([facility]    Past Surgical History:  Procedure Laterality Date   CARDIAC CATHETERIZATION  2009?   EP IMPLANTABLE DEVICE N/A 03/25/2016   Procedure: Pacemaker Implant - Dual Chamber;  Surgeon: GEvans Lance MD;  [facility];  Service: Cardiovascular;  Laterality: N/A;   FRACTURE SURGERY     INSERT / REPLACE / REMOVE PACEMAKER  03/24/2016   INTRAMEDULLARY (IM) NAIL INTERTROCHANTERIC Left 02/08/2022   Procedure: INTRAMEDULLARY (IM) NAIL INTERTROCHANTRIC;  Surgeon: DMeredith Pel MD;  [place] [facility] [facility]   Service: Orthopedics;  Laterality: Left;   IR KYPHO LUMBAR INC FX REDUCE BONE BX UNI/BIL CANNULATION INC/IMAGING  12/29/2021   IR RADIOLOGIST EVAL & MGMT  12/15/2021   LEFT AND RIGHT HEART CATHETERIZATION WITH CORONARY ANGIOGRAM N/A 06/05/2014   Procedure: LEFT AND RIGHT HEART CATHETERIZATION WITH CORONARY ANGIOGRAM;  Surgeon: HSinclair Grooms MD;  [facility];  Service: Cardiovascular;  Laterality: N/A;   NASAL FRACTURE SURGERY     SHOULDER ARTHROSCOPY WITH ROTATOR CUFF REPAIR AND SUBACROMIAL DECOMPRESSION Right 08/16/2013   Procedure: SHOULDER ARTHROSCOPY WITH ROTATOR CUFF REPAIR AND SUBACROMIAL DECOMPRESSION;  Surgeon: JNita Sells MD;  [place] [facility]  Service: Orthopedics;  Laterality: Right;  Right shoulder arthroscopy rotator cuff repair, subacromial decompression.   TONSILLECTOMY     Patient Active Problem List   Diagnosis Date Noted   Acute deep vein thrombosis (DVT) of left lower extremity ([facility] 04/29/2022   Closed intertrochanteric fracture of left hip, initial encounter ([facility]    Closed left hip fracture ([facility] [place] secondary to fall at home 02/08/2022   Paroxysmal atrial fibrillation ([facility] 02/08/2022   Debility 01/08/2022   Constipation 01/07/2022   Physical deconditioning  53/61/4431   Acute metabolic encephalopathy 54/00/8676   Hypomagnesemia 01/04/2022   Pancreatitis 12/30/2021   Parkinson's disease ([place]) 12/21/2021   Parkinson disease ([place]) 12/18/2021   Frequent falls 12/18/2021   History of CVA (cerebrovascular accident) 12/18/2021   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   Hallucinations, visual 07/30/2021   Acetabulum fracture, left ([place]) 06/25/2021   Acute hip pain, left 06/24/2021   Fall at home, initial encounter 06/24/2021   Leukocytosis 06/24/2021   Chronic diastolic heart failure ([place])    Sinusitis, acute maxillary 06/24/2020   AKI (acute kidney injury) ([place])    Labile blood glucose    Cough    Benign essential HTN    Skin lesion of hand     Hypoalbuminemia due to protein-calorie malnutrition ([place])    Controlled type 2 diabetes mellitus with hyperglycemia, without long-term current use of insulin (HCC)    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 No  OBJECTIVE:    TODAY'S TREATMENT:  06-07-22: Pt entered with suboptimal volume (mid 60s dB), in which pt able to correct to low 70s with prompting. Volume decay exhibited throughout conversation requiring usual non-verbal and verbal cues to achieve targeted conversational volume. Re-educated patient and wife on communication strategies (eliminating background noise and facing speaker) as well as Speak Out! Program and principles with recommendation for wife to assist due to inconsistent carryover and recall of targeted tasks despite extensive education and training. Pt endorsed his voice occasionally sounds "froggy," in which SLP educated importance of breath support and projection to optimize vocal quality.  Targeted improving vocal quality and increasing intensity through progressively difficulty speech tasks using Speak Out! program, lesson 134 ST leads pt through exercises providing usual model prior to pt execution. Usual mod-A required for accurate completion of tasks and to meet listed average dBs. Loud ah: 85 dB; counting 82 dB; reading paragraphs 75 dB. Additional education provided re: throat clear alternatives and need to practice speaking with intent at home, especially in conversation.   06-03-22: Pt entered with suboptimal volume (mid 60s dB), in which pt able to correct to low 70s with initial prompt. Conversational volume declined over 1-2 minutes of conversation with no self-recognition. HEP completed x1 since last ST session (nothing marked in book). Targeted improving vocal quality and increasing intensity through progressively difficulty speech tasks using Speak Out! program, lesson 13. ST leads pt through exercises providing usual model prior to pt execution. Usual mod-A required for accurate completion of tasks and to meet listed average dBs. Loud ah: 82 dB; counting 78 dB; reading paragraphs 73 dB. Fatigue and reduced effort impacted  vocal intensity and quality during structured practice, with break ultimately provided. Education provided re: throat clear alternatives and need to practice speaking with intent at home, especially in conversation.   05-31-22: Pt entered with inconsistent conversational volume (mid 60s to low 70s dB) with intermittent cues. Ongoing confusion and reduced recall exhibited for accurate completion of Speak Out! Program despite use of written cues, modeling, and extensive re-education. SLP modified and simplified tasks with sole focus on loudness (versus duration, breath support). Trained use of effort scale to optimize carryover as pt appears to have difficult time perceiving and recognizing targeted vocal intensity. Usual mod-A required for accurate completion of tasks  and to meet listed average dBs. Loud ah: 88 dB; counting 77 dB; reading paragraphs 72 dB; cognitive task 70 dB. Subsequent conversation of 5 minutes required usual min to mod A to maintain conversational volume with use of visual aids.   05-27-22: Reports "slow" progress on speech exercise. Continues to present with some questions regarding structure and purpose of targeted exercises. SLP answers all questions and provides usual models of all exercises to aid in successful completion of tasks. Usual mod-A required for accurate completion of tasks and to meet listed average dBs. Loud ah: 88 dB; counting 76 dB; reading paragraphs 74 dB; cognitive task 69 dB. Some intermittent hoarseness observed which was able to clear with mod-A for intentional speech.   05-24-22: Pt enters with low 70's to mid 60's dB. Targeted improving vocal quality and increasing intensity through progressively difficulty speech tasks using Speak Out! program, lesson 11. ST leads pt through exercises providing frequent model prior to pt execution. Usual mod-A required to achieve target dB this date. Averages this date: loud "ah" 90 dB; reading 84 dB; cognitive speech task 73 dB. Effective pausing to formulate thoughts exhibited. Conversational sample of approx 3 minutes, pt averaged upper 67 dB with occasional to usual in-A to utilize intentional speech. He required clarification that sustained loud /a/ should be consistent pitch as he was gradually increasing pitch during /a/. Spouse present. Modeled cueing and instructed her the exercises should be done twice a day. Showed where to access online practice.  PATIENT EDUCATION: Education details: see above Person educated: Patient and Spouse Education method: Explanation, Demonstration, and Handouts Education comprehension: verbalized understanding, returned demonstration, and needs further education     HOME EXERCISE PROGRAM: Speak Out daily     GOALS: Goals reviewed with patient?  Yes   SHORT TERM GOALS: Target date: 05/19/2022     Pt will complete Speak Out! HEP at least 1x/day given occasional min A over 2 sessions  Baseline: 05-17-22 Goal status: Met   2.  Pt will achieve targeted dB (85-90 dB) on warm up exercises with 80% accuracy given occasional min A over 2 sessions  Baseline:  Goal status: Partially Met   3.  Pt will achieve targeted dB (75-85 dB) on reading exercises with 80% accuracy given occasional min A over 2 sessions Baseline: 05-10-22, 05-17-22 Goal status: Met   4.  Pt will achieve targeted dB (72-78 dB) on cognitive exercises with 80% accuracy given occasional min A over 2 sessions Baseline: 05-10-22, 7-11-07-21 Goal status: Met   5.  Pt will utilize dysarthria compensations in 5-10 minute conversation to optimize vocal intensity and clarity given occasional mod A over 2 sessions  Baseline:  Goal status: Partially Met   6.  Pt will implement memory/attention compensations to aid daily functioning given occasional mod A over 2 sessions  Baseline:  Goal status: Partially Met   LONG TERM GOALS: Target date: 07/16/2022    Pt will complete Speak Out! HEP at least 1x/day (BID recommended) > 1 week  Baseline:  Goal status: ongoing   2.  Pt will achieve targeted dB levels in demonstration of Speak Out! Lessons with 90% accuracy given rare min A over 2 sessions  Baseline:  Goal status: ongoing   3.  Pt will utilize dysarthria compensations in 15+ minute conversation to optimize vocal intensity and clarity given occasional min A over 2 sessions Baseline:  Goal status: ongoing   4.  Pt will implement memory/attention compensations to aid daily functioning given occasional min A over 2 sessions  Baseline:  Goal status: ongoing   5.  Pt will report improved communication effectiveness via PROM by 2 point improvement by last ST session  Baseline: CPIB=26 Goal status: ongoing   ASSESSMENT:   CLINICAL IMPRESSION: Patient is a 81 y.o. male who  was seen today for previous thalamic stroke and Parkinson's disease. 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 Conducted ongoing education and training with patient and wife on Speak Out! Program to optimize vocal intensity and clarity. Usual modeling and cues required to aid patient comprehension and implementation of targeted techniques to maximize speech intelligibility. Skilled ST is warranted to address dysarthria and cognition to maximize communication effectiveness and functional independence.    OBJECTIVE IMPAIRMENTS  Objective impairments include attention, memory, and dysarthria. These impairments are limiting patient from household responsibilities and effectively communicating at home and in community.Factors affecting potential to achieve goals and functional outcome are medical prognosis and previous level of function. Patient will benefit from skilled SLP services to address above impairments and improve overall function.   REHAB POTENTIAL: Good   PLAN: SLP FREQUENCY: 2x/week   SLP DURATION: 12 weeks   PLANNED INTERVENTIONS: Cueing hierachy, Cognitive reorganization, Internal/external aids, Functional tasks, Multimodal communication approach, SLP instruction and feedback, Compensatory strategies, and Patient/family education  Marzetta Board, CCC-SLP 06/07/2022, 2:12 PM

## 2022-06-07 NOTE — Therapy (Signed)
OUTPATIENT OCCUPATIONAL THERAPY [facility]  Patient Name: Jordan [name] MRN: [id number] DOB:[birth year], 81 y.o., male Today's Date: 06/07/2022  PCP: Dr. Kenton [name] REFERRING PROVIDER: Dr. Ranell [name]   OT End of Session - 06/07/22 1251     Visit Number 11    Number of Visits 24    Date for OT Re-Evaluation 07/15/22    Authorization Type [employer]    Authorization Time Period met OOP max  VL:MN  Follow Medicare Guidelines    Authorization - Visit Number 10    Authorization - Number of Visits 10    Progress Note Due on Visit 10                     Past Medical History:  Diagnosis Date   Arthritis    R shoulder, bone spur   Arthritis    "hands" (03/25/2016)   Chronic diastolic heart failure (HCC)    Chronic lower back pain    Coronary heart disease    Dr Jordan [name]   H/O cardiovascular stress test    perhaps last one was 2009   Heart murmur    12/29/11 echo: mild MR, no AS, trivial TR   History of gout    Hyperlipidemia    Hypertension    saw Dr. Linard [name] last earl;y- 2014, cardiac cath. last done ?2009, blocks seen didn't require any intervention at that point.   Narcolepsy    [name] 04-11-97; Mean Latency 1.61mn, SOREM 2   OSA on CPAP    NPSG 12-13-98 AHI 22.7   PONV (postoperative nausea and vomiting)    Presence of permanent cardiac pacemaker    Shortness of breath    with exertion    Stroke ([place]    Type II diabetes mellitus ([place]    Past Surgical History:  Procedure Laterality Date   CARDIAC CATHETERIZATION  2009?   EP IMPLANTABLE DEVICE N/A 03/25/2016   Procedure: Pacemaker Implant - Dual Chamber;  Surgeon: GEvans Lance MD;  [facility];  Service: Cardiovascular;  Laterality: N/A;   FRACTURE SURGERY     INSERT / REPLACE / REMOVE PACEMAKER  03/24/2016   INTRAMEDULLARY (IM) NAIL INTERTROCHANTERIC Left 02/08/2022   Procedure: INTRAMEDULLARY (IM) NAIL INTERTROCHANTRIC;  Surgeon: DMeredith Pel MD;  [facility]   Service: Orthopedics;  Laterality: Left;   IR KYPHO LUMBAR INC FX REDUCE BONE BX UNI/BIL CANNULATION INC/IMAGING  12/29/2021   IR RADIOLOGIST EVAL & MGMT  12/15/2021   LEFT AND RIGHT HEART CATHETERIZATION WITH CORONARY ANGIOGRAM N/A 06/05/2014   Procedure: LEFT AND RIGHT HEART CATHETERIZATION WITH CORONARY ANGIOGRAM;  Surgeon: HSinclair Grooms MD;  [facility];  Service: Cardiovascular;  Laterality: N/A;   NASAL FRACTURE SURGERY     SHOULDER ARTHROSCOPY WITH ROTATOR CUFF REPAIR AND SUBACROMIAL DECOMPRESSION Right 08/16/2013   Procedure: SHOULDER ARTHROSCOPY WITH ROTATOR CUFF REPAIR AND SUBACROMIAL DECOMPRESSION;  Surgeon: JNita Sells MD;  [place] [facility]  Service: Orthopedics;  Laterality: Right;  Right shoulder arthroscopy rotator cuff repair, subacromial decompression.   TONSILLECTOMY     Patient Active Problem List   Diagnosis Date Noted   Acute deep vein thrombosis (DVT) of left lower extremity ([place] 04/29/2022   Closed intertrochanteric fracture of left hip, initial encounter ([facility]    Closed left hip fracture ([place] secondary to fall at home 02/08/2022   Paroxysmal atrial fibrillation ([place] 02/08/2022   Debility 01/08/2022   Constipation 01/07/2022   Physical deconditioning 01/07/2022  Acute metabolic encephalopathy 97/12/6376   Hypomagnesemia 01/04/2022   Pancreatitis 12/30/2021   Parkinson's disease ([place]) 12/21/2021   Parkinson disease ([place]) 12/18/2021   Frequent falls 12/18/2021   History of CVA (cerebrovascular accident) 12/18/2021   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   Hallucinations, visual 07/30/2021   Acetabulum fracture, left ([place]) 06/25/2021   Acute hip pain, left 06/24/2021   Fall at home, initial encounter 06/24/2021   Leukocytosis 06/24/2021   Chronic diastolic heart failure ([place])    Sinusitis, acute maxillary 06/24/2020   AKI (acute kidney injury) ([place])    Labile blood glucose    Cough    Benign essential HTN    Skin lesion of hand     Hypoalbuminemia due to protein-calorie malnutrition ([place])    Controlled type 2 diabetes mellitus with hyperglycemia, without long-term current use of insulin (HCC)    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   Coronary atherosclerosis 12/05/2007    ONSET DATE: 04/19/22  REFERRING DIAG: thalamic CVA I63.81  THERAPY DIAG:  Abnormal posture  Unsteadiness on feet  Other lack of coordination  Attention and concentration deficit  Muscle weakness (generalized)  Frontal lobe and executive function deficit  Rationale for Evaluation and Treatment Rehabilitation  SUBJECTIVE:   SUBJECTIVE STATEMENT: Pt reports ongoing back pain  Pt accompanied by:  self, wife   PERTINENT HISTORY: Jordan [name] is an 81 year old male with history of CAD/chronic diastolic CHF, OSA, H8IF, gout, Parkinson's diease with recent hospital admission 02/17-02-22-23 worsening of gait disorder with falls with hallucinations which was treated with medication adjustment and he was discharged to home. He was readmitted on 12/30/21 with abdominal pain with low grade fever, leucocytosis, lactic acidosis, mild confusion and lethargy secondary to sepsis. He was found to have elevated lipase-165  with inflammatory changes suggestive of primary pancreatitis with secondary inflammation of hepatic flexure of colon or primary sigmoid diverticulitis with secondary extension  to pancreatic tail. He was treated with IVF and broad spectrum antibiotics.   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 Pt fell and sustained hip fx on 02/08/22 and pt underwent left  IM nailing.  04/30/22:  hospitalized with blood clot LLE--changed meds and cleared to resume OT/PT.   Pt accompanied by: self and significant other, spouse, [name]    PRECAUTIONS: Fall and Other: , hx of back kyphoplasty  , pacemaker, hx of L hip fx, s/p IM nailing, hx of shoulder dislocations, hx of LLE DVT  WEIGHT BEARING RESTRICTIONS No  PAIN:  Are you having pain? Yes, lower back (pain patch on) 0/10 at rest, fluctuates w/ movement. O.T. addressing through posture, reaching ex's  PLOF: Independent prior to CVA in 2021  PATIENT GOALS to be more independent, pt's wife states he would like to be able to drive  OBJECTIVE:   TODAY'S TREATMENT:  Pt practiced donning/ doffing shorts using reacher, min v.c  and facilitation, min guard for balance and upright posture Pt requires v.c for upright posture and min facilitation to look forward rather than down Donning shoes with long handled shoe horn, min v.c , min assist Discussion with pt/ wife regarding safety for shower transfers, pt's wife to take a picture and bring in to assist with planning   PATIENT EDUCATION: 06/03/22-ways to prevent future PD complications and community resources-page 1 only, reviewed with patient HOME EXERCISE PROGRAM: Coordination 05/10/22:  cane HEP (shoulder flex, chest press, and shoulder abduction); strategies for cutting food and donning shirt 05/17/22:  strategies for donning sock with sock aide, donning pants with reacher, and writing 06/03/22-ways to prevent future PD complications and community resources-page 1 only, reviewed with patient    GOALS: Goals reviewed with patient? No  SHORT TERM GOALS: Target date: 05/14/22--extend to 05/27/22 due to missed  visits  I with initial HEP Baseline: Goal status: IN PROGRESS, needs reinforcement  2.  Pt will verbalize understanding of adapted strategies to maximize safety and I with ADLs/ IADLs (LT:[id number] food) Baseline:  Goal status: ongoing, education performed regarding cutting, food and dressing, pt. May benefit from reinforcement  3.  Pt will demonstrate improved UE functional use as evidenced by increasing LUE box/ blocks score by 3 blocks. Baseline: RUE 45, LUE 37 Goal status: 05/27/22:  ONGOING R-37blocks, L-39 blocks   4.  Pt will perform LB dressing with min A demonstrating good safety awareness. Baseline: mod-max A Goal status: PARTIALLY MET - extra time  5.  Pt will consistently donn pullover shirt mod I in a reasonable amount of time Baseline: increased time required and prn min assist Goal status: MET  6.   Pt will verbalize understanding of ways to prevent future PD related complications and PD community resources. Goal status: MET  7. Pt will retrieve items from overhead shelf at 95* with right UE with -15 elbow extension Baseline: 95, -20 Goal status: PARTIALLY MET.  115* with -20* elbow ext.  8. Pt will retrieve items from overhead shelf at 110 with left UE with -10 elbow extension Baseline: 110, -15 Goal status: Partially MET 05/27/22:  L-125* -15* elbow ext  LONG TERM GOALS: Target date: 07/15/2022    Pt will demonstrate understanding of memory compensations and ways to keep thinking skills sharp  Goal status: INITIAL  2.  Pt will demonstrate improved fine motor coordination for ADLs as evidenced by decreasing 9 hole peg test score for bilateral UE's by 3 secs Baseline: RUE 42.47, LUE 42.25 Goal status: INITIAL  3.  Pt will demonstrate improved ease with feeding as evidenced by decreasing PPT#2 (self feeding) by 3 secs Baseline: 16.66 secs Goal status: INITIAL  4.  Pt will demonstrate improved ease with fastening buttons as evidenced by decreasing 3 button/  unbutton time to : 95 secs Baseline: 1 min 45 secs Goal status: INITIAL  5.  Pt will write a short paragraph with 100% legibility and no significant decrease in letter size Baseline: mild micrographia Goal status: INITIAL  6.  Pt will donn/ doff jacket in 2 mins and 30 secs or less in seated. Goal status: INITIAL   ASSESSMENT:  CLINICAL IMPRESSION: Pt is progressing towards goals. He can benefit from reinforcement of ADL strategies.  PERFORMANCE DEFICITS in functional skills including ADLs, IADLs, coordination, dexterity, sensation, ROM, strength, pain, flexibility, FMC, GMC, mobility, balance, endurance, decreased knowledge of precautions, decreased knowledge of use of DME, UE functional use, and bradykinesia, cognitive skills including attention, learn, memory,  problem solving, safety awareness, and thought, and psychosocial skills including coping strategies, environmental adaptation, habits, interpersonal interactions, and routines and behaviors.   IMPAIRMENTS are limiting patient from ADLs, IADLs, rest and sleep, play, leisure, and social participation.   COMORBIDITIES may have co-morbidities  that affects occupational performance. Patient will benefit from skilled OT to address above impairments and improve overall function.  MODIFICATION OR ASSISTANCE TO COMPLETE EVALUATION: Min-Moderate modification of tasks or assist with assess necessary to complete an evaluation.  OT OCCUPATIONAL PROFILE AND HISTORY: Detailed assessment: Review of records and additional review of physical, cognitive, psychosocial history related to current functional performance.  CLINICAL DECISION MAKING: Moderate - several treatment options, min-mod task modification necessary  REHAB POTENTIAL: Good  EVALUATION COMPLEXITY: Moderate    PLAN: OT FREQUENCY: 2x/week  OT DURATION: 12 weeks plus eval  PLANNED INTERVENTIONS: self care/ADL training, therapeutic exercise, therapeutic activity,  neuromuscular re-education, manual therapy, passive range of motion, balance training, functional mobility training, aquatic therapy, ultrasound, paraffin, fluidotherapy, moist heat, cryotherapy, contrast bath, patient/family education, cognitive remediation/compensation, visual/perceptual remediation/compensation, energy conservation, coping strategies training, and DME and/or AE instructions  RECOMMENDED OTHER SERVICES: n/a  CONSULTED AND AGREED WITH PLAN OF CARE: Patient and family member/caregiver  PLAN FOR NEXT SESSION  ADL strategies, safety for shower transfers   Lilya Smitherman, OTR/L 06/07/2022, 12:56 PM  [phone] Phone: [phone] 12:56 PM 06/07/22

## 2022-06-07 NOTE — Patient Instructions (Signed)
What to Do Instead of Clearing Your Throat Use these strategies instead of clearing your throat. 1. Swallow your saliva. Swallow as hard as you can.  2. Take a drink of water. 3. Suck on ice chips. 4. Use a silent cough. Whisper the word "huh" from your belly without making a sound and then swallow. This is like a cough but without using your voice. 5. Hum on an "M" and then swallow. 6. Use a light, gentle cough (like tapping your vocal folds together) and then swallow. 7. Silently count to 10 and then swallow

## 2022-06-07 NOTE — Therapy (Signed)
OUTPATIENT PHYSICAL THERAPY TREATMENT NOTE   Patient Name: Jordan [name] MRN: [id number] DOB:1941-09-18, 81 y.o., male Today's Date: 06/07/2022  PCP: [name], MD REFERRING PROVIDER: [name], MD  END OF SESSION:   PT End of Session - 06/07/22 1241     Visit Number 9    Number of Visits 17    Date for PT Re-Evaluation 07/21/22    Authorization Type [employer]    PT Start Time 1233    PT Stop Time 1314    PT Time Calculation (min) 41 min    Activity Tolerance Patient tolerated treatment well    Behavior During Therapy WFL for tasks assessed/performed                Past Medical History:  Diagnosis Date   Arthritis    R shoulder, bone spur   Arthritis    "hands" (03/25/2016)   Chronic diastolic heart failure (HCC)    Chronic lower back pain    Coronary heart disease    Dr Pernell Dupre   H/O cardiovascular stress test    perhaps last one was 2009   Heart murmur    12/29/11 echo: mild MR, no AS, trivial TR   History of gout    Hyperlipidemia    Hypertension    saw Dr. Linard Millers last earl;y- 2014, cardiac cath. last done ?2009, blocks seen didn't require any intervention at that point.   Narcolepsy    [name] 04-11-97; Mean Latency 1.80mn, SOREM 2   OSA on CPAP    NPSG 12-13-98 AHI 22.7   PONV (postoperative nausea and vomiting)    Presence of permanent cardiac pacemaker    Shortness of breath    with exertion    Stroke ([facility]    Type II diabetes mellitus ([place]    Past Surgical History:  Procedure Laterality Date   CARDIAC CATHETERIZATION  2009?   EP IMPLANTABLE DEVICE N/A 03/25/2016   Procedure: Pacemaker Implant - Dual Chamber;  Surgeon: GEvans Lance MD;  [facility];  Service: Cardiovascular;  Laterality: N/A;   FRACTURE SURGERY     INSERT / REPLACE / REMOVE PACEMAKER  03/24/2016   INTRAMEDULLARY (IM) NAIL INTERTROCHANTERIC Left 02/08/2022   Procedure: INTRAMEDULLARY (IM) NAIL INTERTROCHANTRIC;  Surgeon: DMeredith Pel  MD;  [place] [facility]  Service: Orthopedics;  Laterality: Left;   IR KYPHO LUMBAR INC FX REDUCE BONE BX UNI/BIL CANNULATION INC/IMAGING  12/29/2021   IR RADIOLOGIST EVAL & MGMT  12/15/2021   LEFT AND RIGHT HEART CATHETERIZATION WITH CORONARY ANGIOGRAM N/A 06/05/2014   Procedure: LEFT AND RIGHT HEART CATHETERIZATION WITH CORONARY ANGIOGRAM;  Surgeon: HSinclair Grooms MD;  [facility];  Service: Cardiovascular;  Laterality: N/A;   NASAL FRACTURE SURGERY     SHOULDER ARTHROSCOPY WITH ROTATOR CUFF REPAIR AND SUBACROMIAL DECOMPRESSION Right 08/16/2013   Procedure: SHOULDER ARTHROSCOPY WITH ROTATOR CUFF REPAIR AND SUBACROMIAL DECOMPRESSION;  Surgeon: JNita Sells MD;  [place]  Service: Orthopedics;  Laterality: Right;  Right shoulder arthroscopy rotator cuff repair, subacromial decompression.   TONSILLECTOMY     Patient Active Problem List   Diagnosis Date Noted   Acute deep vein thrombosis (DVT) of left lower extremity ([facility] 04/29/2022   Closed intertrochanteric fracture of left hip, initial encounter ([facility]    Closed left hip fracture ([facility] secondary to fall at home 02/08/2022   Paroxysmal atrial fibrillation ([facility] 02/08/2022   Debility 01/08/2022   Constipation 01/07/2022  Physical deconditioning 11/94/1740   Acute metabolic encephalopathy 81/44/8185   Hypomagnesemia 01/04/2022   Pancreatitis 12/30/2021   Parkinson's disease ([place]) 12/21/2021   Parkinson disease ([place]) 12/18/2021   Frequent falls 12/18/2021   History of CVA (cerebrovascular accident) 12/18/2021   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   Hallucinations, visual 07/30/2021   Acetabulum fracture, left ([place]) 06/25/2021   Acute hip pain, left 06/24/2021   Fall at home, initial encounter 06/24/2021   Leukocytosis 06/24/2021   Chronic diastolic heart failure ([place])    Sinusitis, acute maxillary 06/24/2020   AKI (acute kidney injury) ([place])    Labile blood glucose    Cough    Benign essential HTN     Skin lesion of hand    Hypoalbuminemia due to protein-calorie malnutrition ([place])    Controlled type 2 diabetes mellitus with hyperglycemia, without long-term current use of insulin (HCC)    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   Morbid obesity ([place])    Acute blood loss anemia    Acute thalamic infarction ([place]) 04/15/2020   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[place]) 05/25/2019   TIA (transient ischemic attack) 02/17/2018   Type 2 diabetes mellitus with hyperlipidemia ([place]) 02/17/2018   Insomnia 02/01/2018   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   Chronotropic incompetence 07/09/2014   Dyspnea on exertion 03/03/2014   HLD (hyperlipidemia) 12/13/2007   Essential hypertension 12/13/2007   Class 1 obesity 12/05/2007   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   Coronary atherosclerosis 12/05/2007    REFERRING DIAG: I63.81 (ICD-10-CM) - 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[place]) G20 (ICD-10-CM) - Parkinson's disease ([place]) S72.145S (ICD-10-CM) - Type III open nondisplaced intertrochanteric fracture of left femur, sequela    THERAPY DIAG:  Other abnormalities of gait and mobility  Abnormal posture  Unsteadiness on feet  BPPV (benign paroxysmal positional vertigo), right  Rationale for Evaluation and Treatment Rehabilitation  PERTINENT HISTORY: PMHx: PD (diagnosed Jan 2022), HLD, OSA, DM, HTN, chronic diastolic HF, CVA, frequent falls, L2 compression fx, L hip fx   PRECAUTIONS: Fall and ICD/Pacemaker, history of compression fracture at L2 with recent kyphoplasty   SUBJECTIVE: Saw the cardiologist recently regarding his low BP. Was recommended to take it after meals. Not doing a lot of moving at home, feels like he is moving less. Has been having some more dizziness with transitional movements and sudden rolling in bed. Happens very briefly. Wants to be assessed.    VITALS Vitals:   06/07/22 1243  BP: 113/68  Pulse: 63      PAIN:  Are you having pain?  No  OBJECTIVE  TODAY'S TREATMENT:  Self-Care: At start of session, had discussion with pt's spouse and OT about safety with shower transfers and use of RW. Pt's spouse to bring in photo at next session to assist with planning the safest and best way as pt is unable to put grab bars into his shower. Pt also has a collapsible smaller walker that he uses to get in and out of the bathroom. Pt to bring it in to next session for PT to see.     POSITIONAL TESTING: Right Dix-Hallpike: upbeating, right nystagmus Right Sidelying: upbeating, right nystagmus Nystagmus lasting approx. 15 seconds   Did not get to re-assess at end of session due to time constraints.   VESTIBULAR TREATMENT:  Canalith Repositioning: Epley Right: Number of Reps: 2, Response to Treatment: symptoms improved, and Comment: Pt with no dizziness when returning upright.   Performed with 4" risers under mat table to assist with extension due  to pt having decr ROM. Had 2nd PT help with bed mobility with pt's trunk, rolling, and bringing legs on and off mat table.     GAIT TRAINING:  Gait pattern: step to pattern, decreased step length- Right, decreased step length- Left, decreased stride length, decreased hip/knee flexion- Right, decreased hip/knee flexion- Left, shuffling, trunk flexed, poor foot clearance- Right, and poor foot clearance- Left Distance walked: To speech therapy, approx. 4' at end of session   Assistive device utilized: Walker - 2 wheeled Level of assistance: CGA Comments: Pt continues to demonstrate significant over-reliance on BUEs during gait, cues throughout for upright posture and looking ahead and incr stride length. Educated on importance of posture with gait.  Continued to discuss importance of trying to walk at home with wife supervision to help build up strength/endurance.      PATIENT EDUCATION: Education details: BPPV findings, etiology, purpose of treating with maneuvers.  Person educated:  Patient and Spouse Education method: Explanation Education comprehension: verbalized understanding     HOME EXERCISE PROGRAM: Access Code: [email or web address] URL: [email or web address] Date: 04/23/2022 Prepared by: Estevan Ryder  Exercises - Clamshell  - 1 x daily - 7 x weekly - 3 sets - 10 reps - Seated Scapular Retraction  - 1 x daily - 7 x weekly - 3 sets - 10 reps - Seated Long Arc Quad  - 1 x daily - 7 x weekly - 3 sets - 10 reps - Seated Hamstring Stretch  - 1 x daily - 7 x weekly - 3 sets - 10 reps - Sit to Stand  - 1 x daily - 7 x weekly - 3 sets - 10 reps - Seated Heel Raise  - 1 x daily - 7 x weekly - 3 sets - 10 reps - Seated March  - 1 x daily - 7 x weekly - 3 sets - 10 reps - Seated Flexion Stretch with Swiss Ball  - 1 x daily - 7 x weekly - 3 sets - 10 reps          GOALS: Goals reviewed with patient? Yes   SHORT TERM GOALS: Target date: 05/19/2022   Pt will be able to perform initial HEP for strengthening and balance to continue gains with supervision of wife. Baseline: Goal status: MET   2.  BERG to be assessed with STG/LTG written.  Baseline: 20/56 Goal status: MET   3.  Pt will improve 5x sit<>stand to less than or equal to 19 sec without UE support to demonstrate improved functional strength and transfer efficiency.   Baseline: 22.6 seconds without UE support from chair; 21seconds without UE support  Goal status: NOT MET   4.  Pt will ambulate >350' with RW mod I for improved household and short community distances. Baseline: requires CGA/MinA/ limited to 230' Goal status: NOT MET   5.  Pt will improve gait speed with RW to at least 1.3 ft/sec in order to demo improved community mobility.    Baseline:  .87 ft/sec with RW; .27f/s (0.38m) Goal status: NOT MET       LONG TERM GOALS: Target date: 06/16/2022     Pt will be independent with final HEP for strength,gait, balance in order to build upon functional gains made in therapy.    Baseline:  Goal status: INITIAL   2.  Patient will score >25/56 on Berg to demonstrate improved balance Baseline: 20/56 Goal status: REVISED   3.  Pt will improve TUG time with RW vs. LRAD to  26 seconds or less in order to demo decrease fall risk.   Baseline: 33.32 seconds with RW  Goal status: INITIAL   4.  Pt will improve gait speed with RW vs. LRAD to at least 1.9 ft/sec in order to demo improved community mobility.  Baseline: .87 ft/sec with RW Goal status: INITIAL   5.  Pt will ambulate >350' with RW versus LRAD on paved surfaces with supervision for improved community mobility. Baseline: 230' Goal status: REVISED     ASSESSMENT:   CLINICAL IMPRESSION: Pt reporting that he has had more dizziness episodes and requesting to be re-assessed for BPPV. Pt positive for R posterior canalithiasis with R upbeating rotary nystagmus lasting ~15 seconds in R sidelying and R [employer] position. Treated x2 reps with the Epley maneuver. Unable to re-assess at end of session due to time constraints. Will continue to progress towards LTGs.     OBJECTIVE IMPAIRMENTS Abnormal gait, decreased activity tolerance, decreased balance, decreased coordination, decreased endurance, decreased knowledge of use of DME, decreased mobility, decreased ROM, decreased strength, impaired flexibility, postural dysfunction, and pain.    ACTIVITY LIMITATIONS cleaning, community activity, driving, meal prep, laundry, yard work, and shopping.    PERSONAL FACTORS Age, Past/current experiences, Time since onset of injury/illness/exacerbation, and 3+ comorbidities:  PD, HLD, OSA, DM, HTN, chronic diastolic HF, CVA, frequent falls, L2 compression fx, L hip fx   are also affecting patient's functional outcome.      REHAB POTENTIAL: Good   CLINICAL DECISION MAKING: Evolving/moderate complexity   EVALUATION COMPLEXITY: Moderate   PLAN: PT FREQUENCY: 2x/week   PT DURATION: 12 weeks, plus eval   PLANNED  INTERVENTIONS: Therapeutic exercises, Therapeutic activity, Neuromuscular re-education, Balance training, Gait training, Patient/Family education, Joint mobilization, Stair training, Vestibular training, Canalith repositioning, DME instructions, and Manual therapy   PLAN FOR NEXT SESSION:  Will need 10th visit PN. posture, functional strength/endurance, add balance to HEP, gait training, weight shifting to L  Arliss Journey, PT, DPT 06/07/2022, 3:14 PM

## 2022-06-10 ENCOUNTER — Ambulatory Visit: Payer: Medicare (Managed Care) | Admitting: Occupational Therapy

## 2022-06-10 ENCOUNTER — Ambulatory Visit: Payer: Medicare (Managed Care) | Admitting: Physical Therapy

## 2022-06-10 ENCOUNTER — Ambulatory Visit: Payer: Medicare (Managed Care)

## 2022-06-10 ENCOUNTER — Telehealth: Payer: Self-pay

## 2022-06-10 DIAGNOSIS — R471 Dysarthria and anarthria: Secondary | ICD-10-CM

## 2022-06-10 DIAGNOSIS — R293 Abnormal posture: Secondary | ICD-10-CM | POA: Diagnosis not present

## 2022-06-10 DIAGNOSIS — R41841 Cognitive communication deficit: Secondary | ICD-10-CM

## 2022-06-10 DIAGNOSIS — R1032 Left lower quadrant pain: Secondary | ICD-10-CM | POA: Diagnosis not present

## 2022-06-10 NOTE — Therapy (Signed)
OUTPATIENT SPEECH LANGUAGE PATHOLOGY TREATMENT NOTE   Patient Name: Jordan [name] MRN: [id number] DOB:Dec 03, 1940, 81 y.o., male Today's Date: 06/10/2022  PCP: [name] MD REFERRING PROVIDER: Izora Ribas, MD   END OF SESSION:   End of Session - 06/10/22 1313     Visit Number 13    Number of Visits 25    Date for SLP Re-Evaluation 07/16/22    Authorization Type [employer]  $20 co pay per day    SLP Start Time 1317    SLP Stop Time  1408    SLP Time Calculation (min) 51 min    Activity Tolerance Patient tolerated treatment well                Past Medical History:  Diagnosis Date   Arthritis    R shoulder, bone spur   Arthritis    "hands" (03/25/2016)   Chronic diastolic heart failure (HCC)    Chronic lower back pain    Coronary heart disease    Dr Pernell Dupre   H/O cardiovascular stress test    perhaps last one was 2009   Heart murmur    12/29/11 echo: mild MR, no AS, trivial TR   History of gout    Hyperlipidemia    Hypertension    saw Dr. Linard Millers last earl;y- 2014, cardiac cath. last done ?2009, blocks seen didn't require any intervention at that point.   Narcolepsy    [name] 04-11-97; Mean Latency 1.49mn, SOREM 2   OSA on CPAP    NPSG 12-13-98 AHI 22.7   PONV (postoperative nausea and vomiting)    Presence of permanent cardiac pacemaker    Shortness of breath    with exertion    Stroke ([place]    Type II diabetes mellitus ([place]    Past Surgical History:  Procedure Laterality Date   CARDIAC CATHETERIZATION  2009?   EP IMPLANTABLE DEVICE N/A 03/25/2016   Procedure: Pacemaker Implant - Dual Chamber;  Surgeon: GEvans Lance MD;  [facility];  Service: Cardiovascular;  Laterality: N/A;   FRACTURE SURGERY     INSERT / REPLACE / REMOVE PACEMAKER  03/24/2016   INTRAMEDULLARY (IM) NAIL INTERTROCHANTERIC Left 02/08/2022   Procedure: INTRAMEDULLARY (IM) NAIL INTERTROCHANTRIC;  Surgeon: DMeredith Pel MD;  [facility]   Service: Orthopedics;  Laterality: Left;   IR KYPHO LUMBAR INC FX REDUCE BONE BX UNI/BIL CANNULATION INC/IMAGING  12/29/2021   IR RADIOLOGIST EVAL & MGMT  12/15/2021   LEFT AND RIGHT HEART CATHETERIZATION WITH CORONARY ANGIOGRAM N/A 06/05/2014   Procedure: LEFT AND RIGHT HEART CATHETERIZATION WITH CORONARY ANGIOGRAM;  Surgeon: HSinclair Grooms MD;  [facility];  Service: Cardiovascular;  Laterality: N/A;   NASAL FRACTURE SURGERY     SHOULDER ARTHROSCOPY WITH ROTATOR CUFF REPAIR AND SUBACROMIAL DECOMPRESSION Right 08/16/2013   Procedure: SHOULDER ARTHROSCOPY WITH ROTATOR CUFF REPAIR AND SUBACROMIAL DECOMPRESSION;  Surgeon: JNita Sells MD;  [facility]  Service: Orthopedics;  Laterality: Right;  Right shoulder arthroscopy rotator cuff repair, subacromial decompression.   TONSILLECTOMY     Patient Active Problem List   Diagnosis Date Noted   Acute deep vein thrombosis (DVT) of left lower extremity ([place] 04/29/2022   Closed intertrochanteric fracture of left hip, initial encounter ([facility]    Closed left hip fracture ([place] secondary to fall at home 02/08/2022   Paroxysmal atrial fibrillation ([place] 02/08/2022   Debility 01/08/2022   Constipation 01/07/2022   Physical  deconditioning 09/03/1593   Acute metabolic encephalopathy 58/59/2924   Hypomagnesemia 01/04/2022   Pancreatitis 12/30/2021   Parkinson's disease ([place]) 12/21/2021   Parkinson disease ([employer]) 12/18/2021   Frequent falls 12/18/2021   History of CVA (cerebrovascular accident) 12/18/2021   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   Hallucinations, visual 07/30/2021   Acetabulum fracture, left ([place]) 06/25/2021   Acute hip pain, left 06/24/2021   Fall at home, initial encounter 06/24/2021   Leukocytosis 06/24/2021   Chronic diastolic heart failure ([place])    Sinusitis, acute maxillary 06/24/2020   AKI (acute kidney injury) ([place])    Labile blood glucose    Cough    Benign essential HTN    Skin lesion of hand     Hypoalbuminemia due to protein-calorie malnutrition ([place])    Controlled type 2 diabetes mellitus with hyperglycemia, without long-term current use of insulin (HCC)    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   Morbid obesity ([place])    Acute blood loss anemia    Acute thalamic infarction ([place]) 04/15/2020   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[place]) 05/25/2019   TIA (transient ischemic attack) 02/17/2018   Type 2 diabetes mellitus with hyperlipidemia ([place]) 02/17/2018   Insomnia 02/01/2018   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   Chronotropic incompetence 07/09/2014   Dyspnea on exertion 03/03/2014   HLD (hyperlipidemia) 12/13/2007   Essential hypertension 12/13/2007   Class 1 obesity 12/05/2007   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 Yes Pain Location: Left abdominal quadrant (dull/ache that is constant) Pain Level: 4-5/10  OBJECTIVE:    TODAY'S TREATMENT:  06-10-22: Pt entered with suboptimal conversational volume (mid 60s dB), in which pt able to correct to low 70s with occasional to usual non-verbal prompting. Pt reported increased distraction today due to abdominal pain but wanted to continue ST. Focused session on caregiver education and conversational speech to aid patient engagement. Wife endorsed pt exhibited difficulty attending to Speak Out! Lessons at home due to distraction from television and some push-back when wife attempted to provide feedback/cues. Pt identified need to turn off television while completing exercises for optimal  attention and maximal benefit with min prompting. SLP suggested completing online Speak Out! Sessions as pt consistently needs modeling and cues to complete exercises accurately. Both agreed to trial online practices. Pt endorsed challenge of reduced breath support during structured practice, in which SLP educated and instructed increasing frequency of breath during practice. Pt told joke over 4-5 minutes with average of 71 dB with intermittent non-verbal cues to maintain targeted conversational volume. Today, pt cleared throat x9 during session with inconsistent awareness. SLP re-educated and cued throat clear alternatives. Recommended increasing hydration to thin mucous as well.   06-07-22: Pt entered with suboptimal volume (mid 60s dB), in which pt able to correct to low 70s with prompting. Volume decay exhibited throughout conversation requiring usual non-verbal and verbal cues to achieve targeted conversational volume. Re-educated patient and wife on communication strategies (eliminating background noise and facing speaker) as well as Speak Out! Program and principles with recommendation for wife to assist due to inconsistent carryover and recall of targeted tasks despite extensive education and  training. Pt endorsed his voice occasionally sounds "froggy," in which SLP educated importance of breath support and projection to optimize vocal quality. Targeted improving vocal quality and increasing intensity through progressively difficulty speech tasks using Speak Out! program, lesson 14. ST leads pt through exercises providing usual model prior to pt execution. Usual mod-A required for accurate completion of tasks and to meet listed average dBs. Loud ah: 85 dB; counting 82 dB; reading paragraphs 75 dB. Additional education provided re: throat clear alternatives and need to practice speaking with intent at home, especially in conversation.   06-03-22: Pt entered with suboptimal volume (mid 60s dB), in which pt  able to correct to low 70s with initial prompt. Conversational volume declined over 1-2 minutes of conversation with no self-recognition. HEP completed x1 since last ST session (nothing marked in book). Targeted improving vocal quality and increasing intensity through progressively difficulty speech tasks using Speak Out! program, lesson 13. ST leads pt through exercises providing usual model prior to pt execution. Usual mod-A required for accurate completion of tasks and to meet listed average dBs. Loud ah: 82 dB; counting 78 dB; reading paragraphs 73 dB. Fatigue and reduced effort impacted vocal intensity and quality during structured practice, with break ultimately provided. Education provided re: throat clear alternatives and need to practice speaking with intent at home, especially in conversation.   05-31-22: Pt entered with inconsistent conversational volume (mid 60s to low 70s dB) with intermittent cues. Ongoing confusion and reduced recall exhibited for accurate completion of Speak Out! Program despite use of written cues, modeling, and extensive re-education. SLP modified and simplified tasks with sole focus on loudness (versus duration, breath support). Trained use of effort scale to optimize carryover as pt appears to have difficult time perceiving and recognizing targeted vocal intensity. Usual mod-A required for accurate completion of tasks and to meet listed average dBs. Loud ah: 88 dB; counting 77 dB; reading paragraphs 72 dB; cognitive task 70 dB. Subsequent conversation of 5 minutes required usual min to mod A to maintain conversational volume with use of visual aids.   05-27-22: Reports "slow" progress on speech exercise. Continues to present with some questions regarding structure and purpose of targeted exercises. SLP answers all questions and provides usual models of all exercises to aid in successful completion of tasks. Usual mod-A required for accurate completion of tasks and to meet  listed average dBs. Loud ah: 88 dB; counting 76 dB; reading paragraphs 74 dB; cognitive task 69 dB. Some intermittent hoarseness observed which was able to clear with mod-A for intentional speech.   05-24-22: Pt enters with low 70's to mid 60's dB. Targeted improving vocal quality and increasing intensity through progressively difficulty speech tasks using Speak Out! program, lesson 11. ST leads pt through exercises providing frequent model prior to pt execution. Usual mod-A required to achieve target dB this date. Averages this date: loud "ah" 90 dB; reading 84 dB; cognitive speech task 73 dB. Effective pausing to formulate thoughts exhibited. Conversational sample of approx 3 minutes, pt averaged upper 67 dB with occasional to usual in-A to utilize intentional speech. He required clarification that sustained loud /a/ should be consistent pitch as he was gradually increasing pitch during /a/. Spouse present. Modeled cueing and instructed her the exercises should be done twice a day. Showed where to access online practice.  PATIENT EDUCATION: Education details: see above Person educated: Patient and Spouse Education method: Explanation, Demonstration, and Handouts Education comprehension: verbalized understanding, returned demonstration, and needs further education  HOME EXERCISE PROGRAM: Speak Out daily     GOALS: Goals reviewed with patient? Yes   SHORT TERM GOALS: Target date: 05/19/2022     Pt will complete Speak Out! HEP at least 1x/day given occasional min A over 2 sessions  Baseline: 05-17-22 Goal status: Met   2.  Pt will achieve targeted dB (85-90 dB) on warm up exercises with 80% accuracy given occasional min A over 2 sessions  Baseline:  Goal status: Partially Met   3.  Pt will achieve targeted dB (75-85 dB) on reading exercises with 80% accuracy given occasional min A over 2 sessions Baseline: 05-10-22, 05-17-22 Goal status: Met   4.  Pt will achieve targeted dB (72-78 dB)  on cognitive exercises with 80% accuracy given occasional min A over 2 sessions Baseline: 05-10-22, 7-11-07-21 Goal status: Met   5.  Pt will utilize dysarthria compensations in 5-10 minute conversation to optimize vocal intensity and clarity given occasional mod A over 2 sessions  Baseline:  Goal status: Partially Met   6.  Pt will implement memory/attention compensations to aid daily functioning given occasional mod A over 2 sessions  Baseline:  Goal status: Partially Met   LONG TERM GOALS: Target date: 07/16/2022    Pt will complete Speak Out! HEP at least 1x/day (BID recommended) > 1 week  Baseline:  Goal status: ongoing   2.  Pt will achieve targeted dB levels in demonstration of Speak Out! Lessons with 90% accuracy given rare min A over 2 sessions  Baseline:  Goal status: ongoing   3.  Pt will utilize dysarthria compensations in 15+ minute conversation to optimize vocal intensity and clarity given occasional min A over 2 sessions Baseline:  Goal status: ongoing   4.  Pt will implement memory/attention compensations to aid daily functioning given occasional min A over 2 sessions  Baseline:  Goal status: ongoing   5.  Pt will report improved communication effectiveness via PROM by 2 point improvement by last ST session  Baseline: CPIB=26 Goal status: ongoing   ASSESSMENT:   CLINICAL IMPRESSION: Patient is a 81 y.o. male who was seen today for previous thalamic stroke and Parkinson's disease. 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 Conducted ongoing education and training with patient and wife on Speak Out! Program to optimize vocal intensity and clarity. Usual modeling and cues required to aid patient comprehension and implementation of targeted techniques to maximize speech intelligibility. Skilled ST is warranted to address dysarthria and cognition to maximize  communication effectiveness and functional independence.    OBJECTIVE IMPAIRMENTS  Objective impairments include attention, memory, and dysarthria. These impairments are limiting patient from household responsibilities and effectively communicating at home and in community.Factors affecting potential to achieve goals and functional outcome are medical prognosis and previous level of function. Patient will benefit from skilled SLP services to address above impairments and improve overall function.   REHAB POTENTIAL: Good   PLAN: SLP FREQUENCY: 2x/week   SLP DURATION: 12 weeks   PLANNED INTERVENTIONS: Cueing hierachy, Cognitive reorganization, Internal/external aids, Functional tasks, Multimodal communication approach, SLP instruction and feedback, Compensatory strategies, and Patient/family education  Marzetta Board, CCC-SLP 06/10/2022, 3:01 PM

## 2022-06-10 NOTE — Telephone Encounter (Signed)
PA for Modafinil denied. Appeal or not?

## 2022-06-11 DIAGNOSIS — N201 Calculus of ureter: Secondary | ICD-10-CM | POA: Diagnosis not present

## 2022-06-11 DIAGNOSIS — R8289 Other abnormal findings on cytological and histological examination of urine: Secondary | ICD-10-CM | POA: Diagnosis not present

## 2022-06-11 DIAGNOSIS — N3281 Overactive bladder: Secondary | ICD-10-CM | POA: Diagnosis not present

## 2022-06-11 DIAGNOSIS — N281 Cyst of kidney, acquired: Secondary | ICD-10-CM | POA: Diagnosis not present

## 2022-06-11 DIAGNOSIS — N200 Calculus of kidney: Secondary | ICD-10-CM | POA: Diagnosis not present

## 2022-06-11 DIAGNOSIS — R800 Isolated proteinuria: Secondary | ICD-10-CM | POA: Diagnosis not present

## 2022-06-11 DIAGNOSIS — R310 Gross hematuria: Secondary | ICD-10-CM | POA: Diagnosis not present

## 2022-06-11 DIAGNOSIS — R3915 Urgency of urination: Secondary | ICD-10-CM | POA: Diagnosis not present

## 2022-06-11 DIAGNOSIS — R3129 Other microscopic hematuria: Secondary | ICD-10-CM | POA: Diagnosis not present

## 2022-06-11 DIAGNOSIS — N3941 Urge incontinence: Secondary | ICD-10-CM | POA: Diagnosis not present

## 2022-06-11 NOTE — Telephone Encounter (Signed)
LVM for Kelly-wife to call back

## 2022-06-14 ENCOUNTER — Ambulatory Visit: Payer: Medicare (Managed Care) | Admitting: Occupational Therapy

## 2022-06-14 ENCOUNTER — Encounter: Payer: Self-pay | Admitting: Physical Therapy

## 2022-06-14 ENCOUNTER — Ambulatory Visit: Payer: Medicare (Managed Care) | Admitting: Physical Therapy

## 2022-06-14 ENCOUNTER — Ambulatory Visit: Payer: Medicare (Managed Care) | Admitting: Speech Pathology

## 2022-06-14 DIAGNOSIS — M5451 Vertebrogenic low back pain: Secondary | ICD-10-CM | POA: Diagnosis not present

## 2022-06-14 DIAGNOSIS — R2681 Unsteadiness on feet: Secondary | ICD-10-CM

## 2022-06-14 DIAGNOSIS — R293 Abnormal posture: Secondary | ICD-10-CM

## 2022-06-14 DIAGNOSIS — M4856XD Collapsed vertebra, not elsewhere classified, lumbar region, subsequent encounter for fracture with routine healing: Secondary | ICD-10-CM | POA: Diagnosis not present

## 2022-06-14 DIAGNOSIS — R2689 Other abnormalities of gait and mobility: Secondary | ICD-10-CM

## 2022-06-14 DIAGNOSIS — M5136 Other intervertebral disc degeneration, lumbar region: Secondary | ICD-10-CM | POA: Diagnosis not present

## 2022-06-14 NOTE — Therapy (Signed)
OUTPATIENT PHYSICAL THERAPY TREATMENT NOTE/10th visit PN   Patient Name: Jordan [name] MRN: [id number] DOB:1941/09/29, 81 y.o., male Today's Date: 06/14/2022  PCP: [name], MD REFERRING PROVIDER: [name], MD  10th Visit Physical Therapy Progress Note  Dates of Reporting Period: 04/21/22 to 06/14/22    END OF SESSION:   PT End of Session - 06/14/22 1403     Visit Number 10    Number of Visits 17    Date for PT Re-Evaluation 07/21/22    Authorization Type [employer]    PT Start Time 1401    PT Stop Time 1447    PT Time Calculation (min) 46 min    Equipment Utilized During Treatment Gait belt    Activity Tolerance Patient tolerated treatment well    Behavior During Therapy WFL for tasks assessed/performed                Past Medical History:  Diagnosis Date   Arthritis    R shoulder, bone spur   Arthritis    "hands" (03/25/2016)   Chronic diastolic heart failure (HCC)    Chronic lower back pain    Coronary heart disease    Dr Pernell Dupre   H/O cardiovascular stress test    perhaps last one was 2009   Heart murmur    12/29/11 echo: mild MR, no AS, trivial TR   History of gout    Hyperlipidemia    Hypertension    saw Dr. Linard Millers last earl;y- 2014, cardiac cath. last done ?2009, blocks seen didn't require any intervention at that point.   Narcolepsy    [name] 04-11-97; Mean Latency 1.3mn, SOREM 2   OSA on CPAP    NPSG 12-13-98 AHI 22.7   PONV (postoperative nausea and vomiting)    Presence of permanent cardiac pacemaker    Shortness of breath    with exertion    Stroke ([place]    Type II diabetes mellitus ([place]    Past Surgical History:  Procedure Laterality Date   CARDIAC CATHETERIZATION  2009?   EP IMPLANTABLE DEVICE N/A 03/25/2016   Procedure: Pacemaker Implant - Dual Chamber;  Surgeon: GEvans Lance MD;  [facility];  Service: Cardiovascular;  Laterality: N/A;   FRACTURE SURGERY     INSERT / REPLACE / REMOVE  PACEMAKER  03/24/2016   INTRAMEDULLARY (IM) NAIL INTERTROCHANTERIC Left 02/08/2022   Procedure: INTRAMEDULLARY (IM) NAIL INTERTROCHANTRIC;  Surgeon: DMeredith Pel MD;  [facility]  Service: Orthopedics;  Laterality: Left;   IR KYPHO LUMBAR INC FX REDUCE BONE BX UNI/BIL CANNULATION INC/IMAGING  12/29/2021   IR RADIOLOGIST EVAL & MGMT  12/15/2021   LEFT AND RIGHT HEART CATHETERIZATION WITH CORONARY ANGIOGRAM N/A 06/05/2014   Procedure: LEFT AND RIGHT HEART CATHETERIZATION WITH CORONARY ANGIOGRAM;  Surgeon: HSinclair Grooms MD;  [facility];  Service: Cardiovascular;  Laterality: N/A;   NASAL FRACTURE SURGERY     SHOULDER ARTHROSCOPY WITH ROTATOR CUFF REPAIR AND SUBACROMIAL DECOMPRESSION Right 08/16/2013   Procedure: SHOULDER ARTHROSCOPY WITH ROTATOR CUFF REPAIR AND SUBACROMIAL DECOMPRESSION;  Surgeon: JNita Sells MD;  [facility]  Service: Orthopedics;  Laterality: Right;  Right shoulder arthroscopy rotator cuff repair, subacromial decompression.   TONSILLECTOMY     Patient Active Problem List   Diagnosis Date Noted   Acute deep vein thrombosis (DVT) of left lower extremity ([place] 04/29/2022   Closed intertrochanteric fracture of left hip, initial encounter ([place]  Closed left hip fracture (HCC) secondary to fall at home 02/08/2022   Paroxysmal atrial fibrillation ([place]) 02/08/2022   Debility 01/08/2022   Constipation 01/07/2022   Physical deconditioning 09/62/8366   Acute metabolic encephalopathy 29/47/6546   Hypomagnesemia 01/04/2022   Pancreatitis 12/30/2021   Parkinson's disease ([place]) 12/21/2021   Parkinson disease ([place]) 12/18/2021   Frequent falls 12/18/2021   History of CVA (cerebrovascular accident) 12/18/2021   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   Hallucinations, visual 07/30/2021   Acetabulum fracture, left ([place]) 06/25/2021   Acute hip pain, left 06/24/2021   Fall at home, initial encounter 06/24/2021   Leukocytosis 06/24/2021   Chronic  diastolic heart failure ([place])    Sinusitis, acute maxillary 06/24/2020   AKI (acute kidney injury) ([place])    Labile blood glucose    Cough    Benign essential HTN    Skin lesion of hand    Hypoalbuminemia due to protein-calorie malnutrition ([place])    Controlled type 2 diabetes mellitus with hyperglycemia, without long-term current use of insulin (HCC)    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   Morbid obesity ([place])    Acute blood loss anemia    Acute thalamic infarction ([place]) 04/15/2020   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[place]) 05/25/2019   TIA (transient ischemic attack) 02/17/2018   Type 2 diabetes mellitus with hyperlipidemia ([place]) 02/17/2018   Insomnia 02/01/2018   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   Chronotropic incompetence 07/09/2014   Dyspnea on exertion 03/03/2014   HLD (hyperlipidemia) 12/13/2007   Essential hypertension 12/13/2007   Class 1 obesity 12/05/2007   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   Coronary atherosclerosis 12/05/2007    REFERRING DIAG: I63.81 (ICD-10-CM) - 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[place]) G20 (ICD-10-CM) - Parkinson's disease ([place]) S72.145S (ICD-10-CM) - Type III open nondisplaced intertrochanteric fracture of left femur, sequela    THERAPY DIAG:  Other abnormalities of gait and mobility  Abnormal posture  Unsteadiness on feet  Rationale for Evaluation and Treatment Rehabilitation  PERTINENT HISTORY: PMHx: PD (diagnosed Jan 2022), HLD, OSA, DM, HTN, chronic diastolic HF, CVA, frequent falls, L2 compression fx, L hip fx   PRECAUTIONS: Fall and ICD/Pacemaker, history of compression fracture at L2 with recent kyphoplasty   SUBJECTIVE: Saw the PA due to abdominal pain, thinks it is diverticulitis. Pain isn't severe, it is just aggravating. Occasionally he feels like he might be dizzy, but it doesn't start. Brought in his adjustable RW from home that allows him to go doorways.   VITALS There were no vitals filed for this  visit.     PAIN:  Are you having pain? No  OBJECTIVE  TODAY'S TREATMENT:  Goal Assessment: Gait speed with RW: 29.87 seconds = 1.09 ft/sec TUG: 30.22 seconds with RW   GAIT TRAINING:  Gait pattern: step to pattern, decreased step length- Right, decreased step length- Left, decreased stride length, decreased hip/knee flexion- Right, decreased hip/knee flexion- Left, shuffling, trunk flexed, poor foot clearance- Right, and poor foot clearance- Left Distance walked: 230' x 1  Assistive device utilized: Walker - 2 wheeled Level of assistance: CGA Comments: Pt continues to demonstrate significant over-reliance on BUEs during gait, cues throughout for upright posture and looking ahead and incr stride length. Educated on importance of posture with gait.  Continued to discuss importance of trying to walk at home with wife supervision to help build up strength/endurance.      PATIENT EDUCATION: Education details: BPPV findings, etiology, purpose of treating with maneuvers.  Person educated: Patient and Spouse Education method:  Explanation Education comprehension: verbalized understanding     HOME EXERCISE PROGRAM: Access Code: [email or web address] URL: [email or web address] Date: 04/23/2022 Prepared by: Estevan Ryder  Exercises - Clamshell  - 1 x daily - 7 x weekly - 3 sets - 10 reps - Seated Scapular Retraction  - 1 x daily - 7 x weekly - 3 sets - 10 reps - Seated Long Arc Quad  - 1 x daily - 7 x weekly - 3 sets - 10 reps - Seated Hamstring Stretch  - 1 x daily - 7 x weekly - 3 sets - 10 reps - Sit to Stand  - 1 x daily - 7 x weekly - 3 sets - 10 reps - Seated Heel Raise  - 1 x daily - 7 x weekly - 3 sets - 10 reps - Seated March  - 1 x daily - 7 x weekly - 3 sets - 10 reps - Seated Flexion Stretch with Swiss Ball  - 1 x daily - 7 x weekly - 3 sets - 10 reps          GOALS: Goals reviewed with patient? Yes   SHORT TERM GOALS: Target date: 05/19/2022   Pt will be  able to perform initial HEP for strengthening and balance to continue gains with supervision of wife. Baseline: Goal status: MET   2.  BERG to be assessed with STG/LTG written.  Baseline: 20/56 Goal status: MET   3.  Pt will improve 5x sit<>stand to less than or equal to 19 sec without UE support to demonstrate improved functional strength and transfer efficiency.   Baseline: 22.6 seconds without UE support from chair; 21seconds without UE support  Goal status: NOT MET   4.  Pt will ambulate >350' with RW mod I for improved household and short community distances. Baseline: requires CGA/MinA/ limited to 230' Goal status: NOT MET   5.  Pt will improve gait speed with RW to at least 1.3 ft/sec in order to demo improved community mobility.    Baseline:  .87 ft/sec with RW; .61f/s (0.355m) Goal status: NOT MET       LONG TERM GOALS: Target date: 06/16/2022     Pt will be independent with final HEP for strength,gait, balance in order to build upon functional gains made in therapy.   Baseline:  Goal status: INITIAL   2.  Patient will score >25/56 on Berg to demonstrate improved balance Baseline: 20/56 Goal status: REVISED   3.  Pt will improve TUG time with RW vs. LRAD to 26 seconds or less in order to demo decrease fall risk.   Baseline: 33.32 seconds with RW; 30.22 seconds on 06/14/22 Goal status: NOT MET    4.  Pt will improve gait speed with RW vs. LRAD to at least 1.9 ft/sec in order to demo improved community mobility.  Baseline: .87 ft/sec with RW; 29.87 seconds = 1.09 ft/sec on 06/14/22 Goal status: NOT MET   5.  Pt will ambulate >350' with RW versus LRAD on paved surfaces with supervision for improved community mobility. Baseline: 2379with RW on indoor surfaces on 06/14/22 Goal status: NOT MET     ASSESSMENT:   CLINICAL IMPRESSION: Pt reporting that he has had more dizziness episodes and requesting to be re-assessed for BPPV. 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 Treated x2 reps with the Epley maneuver. Unable to re-assess at end of session due  to time constraints. Will continue to progress towards LTGs.     OBJECTIVE IMPAIRMENTS Abnormal gait, decreased activity tolerance, decreased balance, decreased coordination, decreased endurance, decreased knowledge of use of DME, decreased mobility, decreased ROM, decreased strength, impaired flexibility, postural dysfunction, and pain.    ACTIVITY LIMITATIONS cleaning, community activity, driving, meal prep, laundry, yard work, and shopping.    PERSONAL FACTORS Age, Past/current experiences, Time since onset of injury/illness/exacerbation, and 3+ comorbidities:  PD, HLD, OSA, DM, HTN, chronic diastolic HF, CVA, frequent falls, L2 compression fx, L hip fx   are also affecting patient's functional outcome.      REHAB POTENTIAL: Good   CLINICAL DECISION MAKING: Evolving/moderate complexity   EVALUATION COMPLEXITY: Moderate   PLAN: PT FREQUENCY: 2x/week   PT DURATION: 12 weeks, plus eval   PLANNED INTERVENTIONS: Therapeutic exercises, Therapeutic activity, Neuromuscular re-education, Balance training, Gait training, Patient/Family education, Joint mobilization, Stair training, Vestibular training, Canalith repositioning, DME instructions, and Manual therapy   PLAN FOR NEXT SESSION:  Will need 10th visit PN. posture, functional strength/endurance, add balance to HEP, gait training, weight shifting to L  Arliss Journey, PT, DPT 06/14/2022, 2:49 PM

## 2022-06-17 ENCOUNTER — Other Ambulatory Visit: Payer: Self-pay | Admitting: Family Medicine

## 2022-06-17 ENCOUNTER — Other Ambulatory Visit: Payer: Medicare (Managed Care)

## 2022-06-17 ENCOUNTER — Ambulatory Visit: Payer: Medicare (Managed Care) | Admitting: Physical Therapy

## 2022-06-17 ENCOUNTER — Ambulatory Visit: Payer: Medicare (Managed Care) | Admitting: Occupational Therapy

## 2022-06-17 ENCOUNTER — Ambulatory Visit
Admission: RE | Admit: 2022-06-17 | Discharge: 2022-06-17 | Disposition: A | Discharge status: Home / Self Care | Payer: Medicare (Managed Care) | Source: Ambulatory Visit | Attending: Family Medicine | Admitting: Family Medicine

## 2022-06-17 DIAGNOSIS — K760 Fatty (change of) liver, not elsewhere classified: Secondary | ICD-10-CM | POA: Diagnosis not present

## 2022-06-17 DIAGNOSIS — R1030 Lower abdominal pain, unspecified: Secondary | ICD-10-CM

## 2022-06-17 DIAGNOSIS — K859 Acute pancreatitis without necrosis or infection, unspecified: Secondary | ICD-10-CM | POA: Diagnosis not present

## 2022-06-17 DIAGNOSIS — N281 Cyst of kidney, acquired: Secondary | ICD-10-CM | POA: Diagnosis not present

## 2022-06-17 DIAGNOSIS — N200 Calculus of kidney: Secondary | ICD-10-CM | POA: Diagnosis not present

## 2022-06-17 MED ORDER — IOPAMIDOL (ISOVUE-300) INJECTION 61%
80.0000 mL | Freq: Once | INTRAVENOUS | Status: AC | PRN
  Administered 2022-06-17: 80 mL via INTRAVENOUS

## 2022-06-18 ENCOUNTER — Telehealth: Payer: Self-pay | Admitting: Physician Assistant

## 2022-06-18 ENCOUNTER — Other Ambulatory Visit: Payer: Self-pay | Admitting: Family Medicine

## 2022-06-18 DIAGNOSIS — K769 Liver disease, unspecified: Secondary | ICD-10-CM

## 2022-06-18 NOTE — Progress Notes (Signed)
I called Dr Jimmye Norman' office.  They have not referred this patient to GI.  He is not established as a patient with any GI clinic.

## 2022-06-18 NOTE — Telephone Encounter (Signed)
Scheduled appt per 8/17 referral. Pt's wife is aware of appt date and time. Pt's wife i is aware to arrive 15 mins prior to appt time and to bring and updated insurance card. Pt's wife i is aware of appt location.

## 2022-06-21 ENCOUNTER — Inpatient Hospital Stay: Payer: Medicare (Managed Care)

## 2022-06-21 ENCOUNTER — Inpatient Hospital Stay: Payer: Medicare (Managed Care) | Attending: Physician Assistant | Admitting: Physician Assistant

## 2022-06-21 ENCOUNTER — Other Ambulatory Visit: Payer: Self-pay

## 2022-06-21 VITALS — BP 102/64 | HR 60 | Temp 97.3°F | Resp 15 | Wt 226.5 lb

## 2022-06-21 DIAGNOSIS — I5030 Unspecified diastolic (congestive) heart failure: Secondary | ICD-10-CM | POA: Diagnosis not present

## 2022-06-21 DIAGNOSIS — Z79899 Other long term (current) drug therapy: Secondary | ICD-10-CM | POA: Diagnosis not present

## 2022-06-21 DIAGNOSIS — I110 Hypertensive heart disease with heart failure: Secondary | ICD-10-CM | POA: Diagnosis not present

## 2022-06-21 DIAGNOSIS — K6389 Other specified diseases of intestine: Secondary | ICD-10-CM

## 2022-06-21 DIAGNOSIS — G20 Parkinson's disease: Secondary | ICD-10-CM | POA: Insufficient documentation

## 2022-06-21 DIAGNOSIS — Z800 Family history of malignant neoplasm of digestive organs: Secondary | ICD-10-CM | POA: Diagnosis not present

## 2022-06-21 DIAGNOSIS — E119 Type 2 diabetes mellitus without complications: Secondary | ICD-10-CM | POA: Diagnosis not present

## 2022-06-21 DIAGNOSIS — K769 Liver disease, unspecified: Secondary | ICD-10-CM | POA: Insufficient documentation

## 2022-06-21 DIAGNOSIS — R109 Unspecified abdominal pain: Secondary | ICD-10-CM | POA: Diagnosis not present

## 2022-06-21 DIAGNOSIS — R310 Gross hematuria: Secondary | ICD-10-CM | POA: Diagnosis not present

## 2022-06-21 DIAGNOSIS — I2510 Atherosclerotic heart disease of native coronary artery without angina pectoris: Secondary | ICD-10-CM | POA: Diagnosis not present

## 2022-06-21 DIAGNOSIS — Z8673 Personal history of transient ischemic attack (TIA), and cerebral infarction without residual deficits: Secondary | ICD-10-CM | POA: Diagnosis not present

## 2022-06-21 DIAGNOSIS — Z87891 Personal history of nicotine dependence: Secondary | ICD-10-CM | POA: Insufficient documentation

## 2022-06-21 DIAGNOSIS — G4733 Obstructive sleep apnea (adult) (pediatric): Secondary | ICD-10-CM | POA: Diagnosis not present

## 2022-06-21 LAB — CMP (CANCER CENTER ONLY)
ALT: 11 U/L (ref 0–44)
AST: 22 U/L (ref 15–41)
Albumin: 4.2 g/dL (ref 3.5–5.0)
Alkaline Phosphatase: 69 U/L (ref 38–126)
Anion gap: 8 (ref 5–15)
BUN: 24 mg/dL — ABNORMAL HIGH (ref 8–23)
CO2: 24 mmol/L (ref 22–32)
Calcium: 9.9 mg/dL (ref 8.9–10.3)
Chloride: 105 mmol/L (ref 98–111)
Creatinine: 0.92 mg/dL (ref 0.61–1.24)
GFR, Estimated: >60 mL/min (ref >60)
Glucose, Bld: 201 mg/dL — ABNORMAL HIGH (ref 70–99)
Potassium: 4.5 mmol/L (ref 3.5–5.1)
Sodium: 137 mmol/L (ref 135–145)
Total Bilirubin: 0.4 mg/dL (ref 0.3–1.2)
Total Protein: 7.1 g/dL (ref 6.5–8.1)

## 2022-06-21 LAB — CBC WITH DIFFERENTIAL (CANCER CENTER ONLY)
Abs Immature Granulocytes: 0.03 10*3/uL (ref 0.00–0.07)
Basophils Absolute: 0.1 10*3/uL (ref 0.0–0.1)
Basophils Relative: 1 %
Eosinophils Absolute: 0.3 10*3/uL (ref 0.0–0.5)
Eosinophils Relative: 3 %
HCT: 42 % (ref 39.0–52.0)
Hemoglobin: 14.2 g/dL (ref 13.0–17.0)
Immature Granulocytes: 0 %
Lymphocytes Relative: 19 %
Lymphs Abs: 2 10*3/uL (ref 0.7–4.0)
MCH: 29 pg (ref 26.0–34.0)
MCHC: 33.8 g/dL (ref 30.0–36.0)
MCV: 85.7 fL (ref 80.0–100.0)
Monocytes Absolute: 0.9 10*3/uL (ref 0.1–1.0)
Monocytes Relative: 9 %
Neutro Abs: 7.5 10*3/uL (ref 1.7–7.7)
Neutrophils Relative %: 68 %
Platelet Count: 244 10*3/uL (ref 150–400)
RBC: 4.9 MIL/uL (ref 4.22–5.81)
RDW: 14.6 % (ref 11.5–15.5)
WBC Count: 10.8 10*3/uL — ABNORMAL HIGH (ref 4.0–10.5)
nRBC: 0 % (ref 0.0–0.2)

## 2022-06-21 NOTE — Progress Notes (Signed)
[place] [phone]   [phone]  INITIAL CONSULTATION:  Patient Care Team: [name], MD as PCP - General (Family Medicine) [name], MD (Family Medicine)  CHIEF COMPLAINTS/PURPOSE OF CONSULTATION:  Small bowel mass  HISTORY OF PRESENTING ILLNESS:  [name] 81 y.o. male with medical history significant for stroke, parkinsonism, hypertension, hyperlipidemia, diabetes, coronary heart diease, diastolic heart failure. He presents to the diagnostic clinic for initial evaluation for small bowel mass. He is accompanied by his wife for this visit.   On review of the previous records, Mr. Macneal presented to his PCP due to left sided abdominal pain for several weeks. He underwent CT imaging of the abdomen and pelvis on 06/17/2022.  On exam today, he reports having ongoing fatigue and difficulty to mobility due to his parkinsonism. He is able to ambulate on his own at home but uses a wheelchair for long distances. He has a good appetite and denies any weight loss. He denies nausea or vomiting. He continues to experience intermittent episodes of abdominal pain in the mid-left side of the abdomen. He denies any known triggers and pain is characterized as dull in nature. He denies any radiation of pain and takes extra strength tylenol which improves the pain. His bowel habits are unchanged without any recurrent episodes of diarrhea or constipation. 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 Since Xarelto was decreased to 20 mg once daily, he had less gross hematuria but it still occurs intermittently. He denies fevers, chills, night sweats, shortness of breath, chest pain, palpitations, cough or flushing. He has no other complaints. Rest of the 10 point ROS is below.   MEDICAL HISTORY:  Past Medical History:  Diagnosis Date   Arthritis    R shoulder, bone spur    Arthritis    "hands" (03/25/2016)   Chronic diastolic heart failure (HCC)    Chronic lower back pain    Coronary heart disease    Dr Pernell Dupre   H/O cardiovascular stress test    perhaps last one was 2009   Heart murmur    12/29/11 echo: mild MR, no AS, trivial TR   History of gout    Hyperlipidemia    Hypertension    saw Dr. Linard Millers last earl;y- 2014, cardiac cath. last done ?2009, blocks seen didn't require any intervention at that point.   Narcolepsy    [name] 04-11-97; Mean Latency 1.30mn, SOREM 2   OSA on CPAP    NPSG 12-13-98 AHI 22.7   PONV (postoperative nausea and vomiting)    Presence of permanent cardiac pacemaker    Shortness of breath    with exertion    [place]    Type II diabetes mellitus ([facility]     SURGICAL HISTORY: Past Surgical History:  Procedure Laterality Date   CARDIAC CATHETERIZATION  2009?   EP IMPLANTABLE DEVICE N/A 03/25/2016   Procedure: Pacemaker Implant - Dual Chamber;  Surgeon: GEvans Lance MD;  [facility];  Service: Cardiovascular;  Laterality: N/A;   FRACTURE SURGERY     INSERT / REPLACE / REMOVE PACEMAKER  03/24/2016   INTRAMEDULLARY (IM) NAIL INTERTROCHANTERIC Left 02/08/2022   Procedure: INTRAMEDULLARY (IM) NAIL INTERTROCHANTRIC;  Surgeon: DMeredith Pel MD;  [facility]  Service: Orthopedics;  Laterality: Left;   IR KYPHO LUMBAR INC FX REDUCE BONE BX UNI/BIL CANNULATION INC/IMAGING  12/29/2021   IR RADIOLOGIST  EVAL & MGMT  12/15/2021   LEFT AND RIGHT HEART CATHETERIZATION WITH CORONARY ANGIOGRAM N/A 06/05/2014   Procedure: LEFT AND RIGHT HEART CATHETERIZATION WITH CORONARY ANGIOGRAM;  Surgeon: Sinclair Grooms, MD;  Location: [facility] CATH LAB;  Service: Cardiovascular;  Laterality: N/A;   NASAL FRACTURE SURGERY     SHOULDER ARTHROSCOPY WITH ROTATOR CUFF REPAIR AND SUBACROMIAL DECOMPRESSION Right 08/16/2013   Procedure: SHOULDER ARTHROSCOPY WITH ROTATOR CUFF REPAIR AND SUBACROMIAL DECOMPRESSION;  Surgeon: [name], MD;  Location: [facility];  Service: Orthopedics;  Laterality: Right;  Right shoulder arthroscopy rotator cuff repair, subacromial decompression.   TONSILLECTOMY      SOCIAL HISTORY: Social History   Socioeconomic History   Marital status: Married    Spouse name: [name]   Number of children: Not on file   Years of education: Not on file   Highest education level: Not on file  Occupational History   Occupation: [employer]   Occupation: [place] [employer]  Tobacco Use   Smoking status: Former    Packs/day: 2.50    Years: 6.00    Total pack years: 15.00    Types: Cigarettes    Quit date: 06/05/1967    Years since quitting: 55.0   Smokeless tobacco: Never  Vaping Use   Vaping Use: Never used  Substance and Sexual Activity   Alcohol use: Yes    Comment: rare   Drug use: No   Sexual activity: Not Currently  Other Topics Concern   Not on file  Social History Narrative   Lives with wife and son   Right handed   Drinks 1 cups caffeine daily   Social Determinants of Health   Financial Resource Strain: Not on file  Food Insecurity: Not on file  Transportation Needs: Not on file  Physical Activity: Not on file  Stress: Not on file  Social Connections: Not on file  Intimate Partner Violence: Not on file    FAMILY HISTORY: Family History  Problem Relation Age of Onset   Sleep apnea Sister    Colon cancer Sister     ALLERGIES:  is allergic to clonazepam and zoloft [sertraline].  MEDICATIONS:  Current Outpatient Medications  Medication Sig Dispense Refill   amantadine (SYMMETREL) 100 MG capsule Take 2 capsules (200 mg total) by mouth daily. 180 capsule 3   atorvastatin (LIPITOR) 40 MG tablet Take 0.5 tablets (20 mg total) by mouth daily. 30 tablet 0   bifidobacterium infantis (ALIGN) capsule Take 1 capsule by mouth daily.     Camphor-Menthol-Methyl Sal (HM SALONPAS PAIN RELIEF EX) Apply 1 patch topically daily as needed (pain).     Carbidopa-Levodopa ER  (SINEMET CR) 25-100 MG tablet controlled release TAKE 1 TABLET BY MOUTH EVERYDAY AT BEDTIME (Patient taking differently: Take 1 tablet by mouth at bedtime.) 90 tablet 1   cholecalciferol (VITAMIN D3) 25 MCG (1000 UNIT) tablet Take 1,000 Units by mouth daily.     docusate sodium (COLACE) 100 MG capsule Take 1 capsule (100 mg total) by mouth 2 (two) times daily. 10 capsule 0   feeding supplement, GLUCERNA SHAKE, (GLUCERNA SHAKE) LIQD Take 237 mLs by mouth 3 (three) times daily between meals.     furosemide (LASIX) 20 MG tablet USE AS NEEDED FOR UP TO 5 DAYS IF SWELLING IN LEGS RECURS. CONTACT CARDIOLOGY IS SWELLING DOES NOT IMPROVE OR GETS WORSE. 15 tablet 0   lidocaine (LIDODERM) 5 % Place 1 patch onto the skin daily as needed. Purchase over the counter. On  for 12 hours and off for 12 hours 30 patch 0   magnesium gluconate (MAGONATE) 500 MG tablet Take 0.5 tablets (250 mg total) by mouth at bedtime. 30 tablet 0   Melatonin 10 MG TABS Take 10 mg by mouth at bedtime.     metFORMIN (GLUCOPHAGE) 1000 MG tablet Take 1 tablet (1,000 mg total) by mouth daily with breakfast. (Patient taking differently: Take 500 mg by mouth 2 (two) times daily with a meal.) 30 tablet 0   metoprolol tartrate (LOPRESSOR) 25 MG tablet Take 1 tablet (25 mg total) by mouth 2 (two) times daily. 60 tablet 0   Multiple Vitamin (MULTIVITAMIN WITH MINERALS) TABS tablet Take 1 tablet by mouth daily.     NITROSTAT 0.4 MG SL tablet PLACE 1 TABLET (0.4 MG TOTAL) UNDER THE TONGUE EVERY 5 (FIVE) MINUTES AS NEEDED FOR CHEST PAIN. (Patient taking differently: Place 0.4 mg under the tongue every 5 (five) minutes as needed for chest pain.) 25 tablet 5   Omega 3 1200 MG CAPS Take 1,200 mg by mouth daily.     polyethylene glycol (MIRALAX / GLYCOLAX) 17 g packet Take 17 g by mouth daily. 14 each 0   polyvinyl alcohol (LIQUIFILM TEARS) 1.4 % ophthalmic solution Place 1 drop into both eyes as needed for dry eyes. 15 mL 0   QUEtiapine (SEROQUEL) 25 MG  tablet TAKE 1/2 TABLETS (12.5 MG TOTAL) BY MOUTH AT BEDTIME. 45 tablet 1   RIVAROXABAN (XARELTO) VTE STARTER PACK (15 & 20 MG) Follow package directions: Take one '15mg'$  tablet by mouth twice a day. On day 22, switch to one '20mg'$  tablet once a day. Take with food. 51 each 0   Vitamin D, Ergocalciferol, (DRISDOL) 1.25 MG (50000 UNIT) CAPS capsule TAKE 1 CAPSULE (50,000 UNITS TOTAL) BY MOUTH EVERY 7 (SEVEN) DAYS 5 capsule 0   Wheat Dextrin (BENEFIBER PO) Take 10 mLs by mouth daily.     Ensure Plus (ENSURE PLUS) LIQD Take 237 mLs by mouth every other day. (Patient not taking: Reported on 06/21/2022)     methocarbamol (ROBAXIN) 500 MG tablet Take 1 tablet (500 mg total) by mouth every 8 (eight) hours as needed for muscle spasms. (Patient not taking: Reported on 06/21/2022) 30 tablet 0   modafinil (PROVIGIL) 100 MG tablet Take 1 tablet (100 mg total) by mouth daily. (Patient not taking: Reported on 06/21/2022) 90 tablet 3   No current facility-administered medications for this visit.    REVIEW OF SYSTEMS:   Constitutional: ( - ) fevers, ( - )  chills , ( - ) night sweats Eyes: ( - ) blurriness of vision, ( - ) double vision, ( - ) watery eyes Ears, nose, mouth, throat, and face: ( - ) mucositis, ( - ) sore throat Respiratory: ( - ) cough, ( - ) dyspnea, ( - ) wheezes Cardiovascular: ( - ) palpitation, ( - ) chest discomfort, ( - ) lower extremity swelling Gastrointestinal:  ( - ) nausea, ( - ) heartburn, ( - ) change in bowel habits Skin: ( - ) abnormal skin rashes Lymphatics: ( - ) new lymphadenopathy, ( - ) easy bruising Neurological: ( - ) numbness, ( - ) tingling, ( - ) new weaknesses Behavioral/Psych: ( - ) mood change, ( - ) new changes  All other systems were reviewed with the patient and are negative.  PHYSICAL EXAMINATION: ECOG PERFORMANCE STATUS: 1 - Symptomatic but completely ambulatory  Vitals:   06/21/22 1415  BP: 102/64  Pulse: 60  Resp: 15  Temp: (!) 97.3 F (36.3 C)  SpO2: 95%    Filed Weights   06/21/22 1415  Weight: 226 lb 8 oz (102.7 kg)    GENERAL: well appearing male in NAD. Exam was done in wheelchair SKIN: skin color, texture, turgor are normal, no rashes or significant lesions EYES: conjunctiva are pink and non-injected, sclera clear OROPHARYNX: no exudate, no erythema; lips, buccal mucosa, and tongue normal  LYMPH:  no palpable lymphadenopathy in the cervical or supraclavicular lymph nodes.  LUNGS: clear to auscultation and percussion with normal breathing effort HEART: regular rate & rhythm and no murmurs and no lower extremity edema ABDOMEN: soft, non-tender, non-distended, normal bowel sounds Musculoskeletal: no cyanosis of digits and no clubbing  PSYCH: alert & oriented x 3, fluent speech NEURO: no focal motor/sensory deficits  LABORATORY DATA:  I have reviewed the data as listed    Latest Ref Rng & Units 06/21/2022    3:10 PM 04/30/2022    1:04 AM 04/29/2022    1:10 PM  CBC  WBC 4.0 - 10.5 K/uL 10.8  9.9  13.9   Hemoglobin 13.0 - 17.0 g/dL 14.2  13.6  14.8   Hematocrit 39.0 - 52.0 % 42.0  40.3  43.2   Platelets 150 - 400 K/uL 244  194  235        Latest Ref Rng & Units 06/21/2022    3:10 PM 04/30/2022    1:04 AM 04/29/2022   10:36 AM  CMP  Glucose 70 - 99 mg/dL 201  201  173   BUN 8 - 23 mg/dL '24  24  27   '$ Creatinine 0.61 - 1.24 mg/dL 0.92  0.88  1.10   Sodium 135 - 145 mmol/L 137  137  139   Potassium 3.5 - 5.1 mmol/L 4.5  4.3  4.2   Chloride 98 - 111 mmol/L 105  105  104   CO2 22 - 32 mmol/L '24  19  24   '$ Calcium 8.9 - 10.3 mg/dL 9.9  9.1  9.8   Total Protein 6.5 - 8.1 g/dL 7.1   7.2   Total Bilirubin 0.3 - 1.2 mg/dL 0.4   1.0   Alkaline Phos 38 - 126 U/L 69   88   AST 15 - 41 U/L 22   21   ALT 0 - 44 U/L 11   13      RADIOGRAPHIC STUDIES: I have personally reviewed the radiological images as listed and agreed with the findings in the report. CT ABDOMEN PELVIS W CONTRAST  Result Date: 06/17/2022 CLINICAL DATA:  Lower  abdominal pain for 2-3 weeks in a male age 72. EXAM: CT ABDOMEN AND PELVIS WITH CONTRAST TECHNIQUE: Multidetector CT imaging of the abdomen and pelvis was performed using the standard protocol following bolus administration of intravenous contrast. RADIATION DOSE REDUCTION: This exam was performed according to the departmental dose-optimization program which includes automated exposure control, adjustment of the mA and/or kV according to patient size and/or use of iterative reconstruction technique. CONTRAST:  88m ISOVUE-300 IOPAMIDOL (ISOVUE-300) INJECTION 61% COMPARISON:  December 30, 2021 FINDINGS: Lower chest: Pacer leads in the RIGHT heart. Signs of calcified coronary artery disease. Aortic atherosclerosis. No pericardial effusion or nodularity. Basilar atelectasis and scarring. Visible airways are patent. Hepatobiliary: Hepatic steatosis. There are areas of low attenuation in the liver which do not appear changed compared to previous imaging. Largest area in the posterior RIGHT hepatic lobe hepatic subsegment VII measuring approximately 4 cm greatest  axial dimension 3.3 cm short axis is similar with respect to long axis and may have increased with respect to short axis where it measured approximately 2.5 cm greatest short axis dimension on previous imaging. There is a central area of calcification which was present on remote priors. The lesion surrounding the calcification was not present on more remote priors before December of 2022. A small low-density lesion in the inferior RIGHT hemiliver likely a cyst unchanged since 2015 (image 28/2) lobular hepatic contours. No pericholecystic stranding or signs of biliary duct dilation. Pancreas: Subtle low attenuation in the pancreas is diminished in size (image 24/2) expansion of the pancreatic tail up to 3 cm now 1.8 cm and without surrounding stranding. Small cystic area or area of ductal dilation in the pancreatic head is unchanged approximately 8 x 17 mm (image  33/2) Spleen: Normal. Adrenals/Urinary Tract: Adrenal glands are normal. Stable 12-13 mm LEFT renal pelvic calculus with stranding about the LEFT renal pelvis. This does not obstruct and there is no hydronephrosis on the LEFT. Benign-appearing renal cysts are present in the bilateral kidneys for which no additional dedicated follow-up imaging is recommended largest on the LEFT 2.4 cm greatest axial dimension, largest on the RIGHT 3.2 cm greatest axial dimension. Again no signs of obstruction and no calculi beyond the LEFT UPJ in the LEFT ureter. Stomach/Bowel: No acute gastrointestinal process. Enhancing partially calcified mass in the small bowel mesentery of the RIGHT mid abdomen measuring 2.1 x 2.8 cm present as far back undo that stable since previous imaging. Adjacent small lymph node showing mild increased enhancement 6 mm. 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 Vascular/Lymphatic: Atherosclerotic changes of the abdominal aorta without aneurysm. No retroperitoneal or upper abdominal adenopathy. Calcified mesenteric mass as discussed. No pelvic adenopathy. Reproductive: Mild to moderate prostatomegaly. Other: No ascites. Musculoskeletal: No acute bone finding or destructive bone process. Signs of cement augmentation of the L2 vertebral body with similar loss of height. Spinal degenerative changes. IMPRESSION: 1. 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 2. Hepatic lesion as discussed above in the posterior RIGHT hemiliver is suspicious for metastasis in this context. For all above findings in lieu of  hepatic MRI, if the patient is not able to undergo hepatic MRI, biochemical correlation and DOTATATE PET may be helpful. 3. Bowel loop in the central abdomen with nodular enhancement approximately 8-9 mm along the wall in terms of greatest wall thickness. 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 4. Hepatic steatosis. 5. Resolution of inflammation about the pancreatic tail with nodular appearance of pancreatic tail. Attention on subsequent imaging is suggested. This may reflect sequela of prior inflammation given improvement but underlying lesion in this location while not favored is not excluded. Aortic Atherosclerosis (ICD10-I70.0). These results will be called to the ordering clinician or representative by the Radiologist Assistant, and communication documented in the PACS or [employer]. Electronically Signed   By: Zetta Bills M.D.   On: 06/17/2022 12:29   CUP PACEART INCLINIC DEVICE CHECK  Result Date:  06/07/2022 Pacemaker check in clinic. Normal device function. Thresholds, sensing, impedances consistent with previous measurements. Device programmed to maximize longevity. 1 At/Af episode 4hrs36mn in duration + Xarelto No high ventricular rates noted. Device programmed at appropriate safety margins. Histogram distribution appropriate for patient activity level. Device programmed to optimize intrinsic conduction. Estimated longevity 4.5 years Patient enrolled in remote follow-up. Patient education completed.ETheodoro DoingBSN,RN,CCDS   ASSESSMENT & PLAN FBRON SNELLINGSis a 81y.o. male who presents to the diagnostic clinic for initial evaluation for recent CT scan concerning for small bowel mass. We reviewed the CT imaging in detail that showed small bowel mass, mesenteric mass and hepatic lesion. Findings are concerning for underlying malignancy such  as carcinoid tumor. We reviewed recommended workup including serologic studies today, MRI abdomen/pelvis and biopsy. Patient is in agreement of the plan.   #Small bowel mass/mesenteric mass/hepatic lesion: --Labs today to check CBC, CMP, chromogranin and CEA levels --MRI abdomen/pelvis to further evaluate liver lesion --Request [place] liver biopsy  #Abdominal pain: ---Pain is well managed with extra strength tylenol --Continue to monitor.   #Hematuria: --[facility]--Renal [place] 06/11/2022 showed nonobstructive left uteropelvic junction calculus. --Urine cytology showed atypical urothelial cells --Patient is scheduled to undergo cystoscopy and CT urogram.    Orders Placed This Encounter  Procedures   CBC with Differential ([place])    Standing Status:   Future    Number of Occurrences:   1    Standing Expiration Date:   06/22/2023   CMP ([place])    Standing Status:   Future    Number of Occurrences:   1    Standing Expiration Date:   06/22/2023   Chromogranin A    Standing Status:   Future    Number of Occurrences:   1    Standing Expiration Date:   06/21/2023   CEA (IN HOUSE-[facility])FOR [facility] ONLY    Standing Status:   Future    Standing Expiration Date:   06/22/2023    All questions were answered. The patient knows to call the clinic with any problems, questions or concerns.  I have spent a total of 60 minutes minutes of face-to-face and non-face-to-face time, preparing to see the patient, obtaining and/or reviewing separately obtained history, performing a medically appropriate examination, counseling and educating the patient, ordering tests/procedures, referring and communicating with other health care professionals, documenting clinical information in the electronic health record, and care coordination.   IDede Query PA-C Department of Hematology/Oncology [place]Phone: [phone]

## 2022-06-21 NOTE — Progress Notes (Unsigned)
I met with Mr Jordan [name] and his wife after his consultation with Dede Query, PA-C.  I explained my role as a nurse navigator and provided my contact information.  All questions were answered.  They verbalized understanding.

## 2022-06-22 ENCOUNTER — Ambulatory Visit: Payer: Medicare (Managed Care) | Admitting: Physical Therapy

## 2022-06-22 ENCOUNTER — Ambulatory Visit: Payer: Medicare (Managed Care) | Admitting: Occupational Therapy

## 2022-06-22 ENCOUNTER — Other Ambulatory Visit (HOSPITAL_COMMUNITY): Payer: Self-pay | Admitting: Family Medicine

## 2022-06-22 ENCOUNTER — Encounter: Payer: Medicare (Managed Care) | Admitting: Speech Pathology

## 2022-06-22 ENCOUNTER — Other Ambulatory Visit: Payer: Self-pay | Admitting: Physician Assistant

## 2022-06-22 VITALS — BP 112/56 | HR 60

## 2022-06-22 DIAGNOSIS — R2689 Other abnormalities of gait and mobility: Secondary | ICD-10-CM

## 2022-06-22 DIAGNOSIS — K6389 Other specified diseases of intestine: Secondary | ICD-10-CM

## 2022-06-22 DIAGNOSIS — M6281 Muscle weakness (generalized): Secondary | ICD-10-CM

## 2022-06-22 DIAGNOSIS — R278 Other lack of coordination: Secondary | ICD-10-CM

## 2022-06-22 DIAGNOSIS — R2681 Unsteadiness on feet: Secondary | ICD-10-CM

## 2022-06-22 DIAGNOSIS — R41844 Frontal lobe and executive function deficit: Secondary | ICD-10-CM

## 2022-06-22 DIAGNOSIS — R293 Abnormal posture: Secondary | ICD-10-CM | POA: Diagnosis not present

## 2022-06-22 DIAGNOSIS — R41840 Attention and concentration deficit: Secondary | ICD-10-CM

## 2022-06-22 DIAGNOSIS — K769 Liver disease, unspecified: Secondary | ICD-10-CM

## 2022-06-22 LAB — CHROMOGRANIN A: Chromogranin A (ng/mL): 118 ng/mL — ABNORMAL HIGH (ref 0.0–101.8)

## 2022-06-22 NOTE — Therapy (Signed)
OUTPATIENT OCCUPATIONAL THERAPY [facility]  Patient Name: Jordan [name] MRN: [id number] DOB:02-22-1941, 81 y.o., male Today's Date: 06/22/2022  PCP: Dr. Kenton [name] REFERRING PROVIDER: Dr. Ranell [name]   OT End of Session - 06/22/22 1323     Visit Number 12    Number of Visits 24    Date for OT Re-Evaluation 07/15/22    Authorization Type [employer]    Authorization Time Period met OOP max  VL:MN  Follow Medicare Guidelines    Authorization - Visit Number 11    Progress Note Due on Visit 20    OT Start Time 1321    OT Stop Time 1359    OT Time Calculation (min) 38 min    Activity Tolerance Patient tolerated treatment well    Behavior During Therapy WFL for tasks assessed/performed                     Past Medical History:  Diagnosis Date   Arthritis    R shoulder, bone spur   Arthritis    "hands" (03/25/2016)   Chronic diastolic heart failure (HCC)    Chronic lower back pain    Coronary heart disease    Dr Jordan [name]   H/O cardiovascular stress test    perhaps last one was 2009   Heart murmur    12/29/11 echo: mild MR, no AS, trivial TR   History of gout    Hyperlipidemia    Hypertension    saw Dr. Linard [name] last earl;y- 2014, cardiac cath. last done ?2009, blocks seen didn't require any intervention at that point.   Narcolepsy    [name] 04-11-97; Mean Latency 1.22mn, SOREM 2   OSA on CPAP    NPSG 12-13-98 AHI 22.7   PONV (postoperative nausea and vomiting)    Presence of permanent cardiac pacemaker    Shortness of breath    with exertion    Stroke ([name]    Type II diabetes mellitus ([place]    Past Surgical History:  Procedure Laterality Date   CARDIAC CATHETERIZATION  2009?   EP IMPLANTABLE DEVICE N/A 03/25/2016   Procedure: Pacemaker Implant - Dual Chamber;  Surgeon: Jordan [name];  [facility];  Service: Cardiovascular;  Laterality: N/A;   FRACTURE SURGERY     INSERT / REPLACE / REMOVE PACEMAKER  03/24/2016    INTRAMEDULLARY (IM) NAIL INTERTROCHANTERIC Left 02/08/2022   Procedure: INTRAMEDULLARY (IM) NAIL INTERTROCHANTRIC;  Surgeon: Jordan [name];  [facility]  Service: Orthopedics;  Laterality: Left;   IR KYPHO LUMBAR INC FX REDUCE BONE BX UNI/BIL CANNULATION INC/IMAGING  12/29/2021   IR RADIOLOGIST EVAL & MGMT  12/15/2021   LEFT AND RIGHT HEART CATHETERIZATION WITH CORONARY ANGIOGRAM N/A 06/05/2014   Procedure: LEFT AND RIGHT HEART CATHETERIZATION WITH CORONARY ANGIOGRAM;  Surgeon: Jordan [name];  [facility];  Service: Cardiovascular;  Laterality: N/A;   NASAL FRACTURE SURGERY     SHOULDER ARTHROSCOPY WITH ROTATOR CUFF REPAIR AND SUBACROMIAL DECOMPRESSION Right 08/16/2013   Procedure: SHOULDER ARTHROSCOPY WITH ROTATOR CUFF REPAIR AND SUBACROMIAL DECOMPRESSION;  Surgeon: Jordan [name];  [facility]  Service: Orthopedics;  Laterality: Right;  Right shoulder arthroscopy rotator cuff repair, subacromial decompression.   TONSILLECTOMY     Patient Active Problem List   Diagnosis Date Noted   Acute deep vein thrombosis (DVT) of left lower extremity ([facility] 04/29/2022   Closed intertrochanteric fracture of left hip, initial encounter ([place] [place]  Closed left hip fracture (HCC) secondary to fall at home 02/08/2022   Paroxysmal atrial fibrillation ([place]) 02/08/2022   Debility 01/08/2022   Constipation 01/07/2022   Physical deconditioning 17/79/3903   Acute metabolic encephalopathy 00/92/3300   Hypomagnesemia 01/04/2022   Pancreatitis 12/30/2021   Parkinson's disease ([place]) 12/21/2021   Parkinson disease ([place]) 12/18/2021   Frequent falls 12/18/2021   History of CVA (cerebrovascular accident) 12/18/2021   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   Hallucinations, visual 07/30/2021   Acetabulum fracture, left ([place]) 06/25/2021   Acute hip pain, left 06/24/2021   Fall at home, initial encounter 06/24/2021   Leukocytosis 06/24/2021   Chronic diastolic heart failure  ([place])    Sinusitis, acute maxillary 06/24/2020   AKI (acute kidney injury) ([place])    Labile blood glucose    Cough    Benign essential HTN    Skin lesion of hand    Hypoalbuminemia due to protein-calorie malnutrition ([place])    Controlled type 2 diabetes mellitus with hyperglycemia, without long-term current use of insulin (HCC)    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   Morbid obesity ([place])    Acute blood loss anemia    Acute thalamic infarction ([place]) 04/15/2020   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[place]) 05/25/2019   TIA (transient ischemic attack) 02/17/2018   Type 2 diabetes mellitus with hyperlipidemia ([place]) 02/17/2018   Insomnia 02/01/2018   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   Chronotropic incompetence 07/09/2014   Dyspnea on exertion 03/03/2014   HLD (hyperlipidemia) 12/13/2007   Essential hypertension 12/13/2007   Class 1 obesity 12/05/2007   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   Coronary atherosclerosis 12/05/2007    ONSET DATE: 04/19/22  REFERRING DIAG: thalamic CVA I63.81  THERAPY DIAG:  Other lack of coordination  Attention and concentration deficit  Muscle weakness (generalized)  Frontal lobe and executive function deficit  Unsteadiness on feet  Abnormal posture  Other abnormalities of gait and mobility  Rationale for Evaluation and Treatment Rehabilitation  SUBJECTIVE:   SUBJECTIVE STATEMENT: Pt reports ongoing abdominal pain  Pt accompanied by:  self, wife   PERTINENT HISTORY: Jordan [name] is an 81 year old male with history of CAD/chronic diastolic CHF, OSA, T6AU, gout, Parkinson's diease with recent hospital admission 02/17-02-22-23 worsening of gait disorder with falls with hallucinations which was treated with medication adjustment and he was discharged to home. He was readmitted on 12/30/21 with abdominal pain with low grade fever, leucocytosis, lactic acidosis, mild confusion and lethargy secondary to  sepsis. He was found to have elevated lipase-165  with inflammatory changes suggestive of primary pancreatitis with secondary inflammation of hepatic flexure of colon or primary sigmoid diverticulitis with secondary extension to pancreatic tail. He was treated with IVF and broad spectrum antibiotics.   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 Pt fell and sustained hip fx on 02/08/22 and pt underwent left  IM nailing.  04/30/22:  hospitalized with blood clot LLE--changed meds and cleared to resume OT/PT.  New diagonis abdominal and liver mass, await CT   Pt accompanied by: self and significant other, spouse, [name]    PRECAUTIONS: Fall and Other: , hx of back kyphoplasty  , pacemaker, hx of L hip fx, s/p IM nailing, hx of shoulder dislocations, hx of LLE DVT  WEIGHT BEARING RESTRICTIONS No  PAIN:  Are you having pain? Yes, lower back (pain patch on) 0/10 at rest, fluctuates w/  movement. O.T. addressing through posture, reaching ex's  PLOF: Independent prior to CVA in 2021  PATIENT GOALS to be more independent, pt's wife states he would like to be able to drive  OBJECTIVE:   TODAY'S TREATMENT:    Discussion with pt/ wife regarding safety for shower transfers, pt's wife  brought in a picture of shower , therapist recommends grab bars along the walls of shower for increased safety and ease with transfer to shower seat. Discussed plans for d/c next week, pt/ wife verbalize understanding. Seated PWR!up and rock(with arms lower for rock), min-mod v.c and mirror used for feed back for posture. Reviewed cane exercises HEP in front of mirror for improved posture, 10 reps each, min v.c for posture. Education regarding memory compensations.     PATIENT EDUCATION: 06/03/22-ways to prevent future PD complications and community resources-page 1 only, reviewed with patient HOME EXERCISE PROGRAM: Coordination 05/10/22:  cane  HEP (shoulder flex, chest press, and shoulder abduction); strategies for cutting food and donning shirt 05/17/22:  strategies for donning sock with sock aide, donning pants with reacher, and writing 06/03/22-ways to prevent future PD complications and community resources-page 1 only, reviewed with patient 06/22/22-memory compensations.    GOALS: Goals reviewed with patient? No  SHORT TERM GOALS: Target date: 05/14/22--extend to 05/27/22 due to missed visits  I with initial HEP Baseline: Goal status: IN PROGRESS, needs reinforcement  2.  Pt will verbalize understanding of adapted strategies to maximize safety and I with ADLs/ IADLs (UQ:[id number] food) Baseline:  Goal status: ongoing, education performed regarding cutting, food and dressing, pt. May benefit from reinforcement  3.  Pt will demonstrate improved UE functional use as evidenced by increasing LUE box/ blocks score by 3 blocks. Baseline: RUE 45, LUE 37 Goal status: 05/27/22:  ONGOING R-37blocks, L-39 blocks   4.  Pt will perform LB dressing with min A demonstrating good safety awareness. Baseline: mod-max A Goal status: PARTIALLY MET - extra time  5.  Pt will consistently donn pullover shirt mod I in a reasonable amount of time Baseline: increased time required and prn min assist Goal status: MET  6.   Pt will verbalize understanding of ways to prevent future PD related complications and PD community resources. Goal status: MET  7. Pt will retrieve items from overhead shelf at 95* with right UE with -15 elbow extension Baseline: 95, -20 Goal status: PARTIALLY MET.  115* with -20* elbow ext.  8. Pt will retrieve items from overhead shelf at 110 with left UE with -10 elbow extension Baseline: 110, -15 Goal status: Partially MET 05/27/22:  L-125* -15* elbow ext  LONG TERM GOALS: Target date: 07/15/2022    Pt will demonstrate understanding of memory compensations and ways to keep thinking skills sharp  Goal status: ongoing,  memory compensations issued  2.  Pt will demonstrate improved fine motor coordination for ADLs as evidenced by decreasing 9 hole peg test score for bilateral UE's by 3 secs Baseline: RUE 42.47, LUE 42.25 Goal status: INITIAL  3.  Pt will demonstrate improved ease with feeding as evidenced by decreasing PPT#2 (self feeding) by 3 secs Baseline: 16.66 secs Goal status: INITIAL  4.  Pt will demonstrate improved ease with fastening buttons as evidenced by decreasing 3 button/ unbutton time to : 95 secs Baseline: 1 min 45 secs Goal status: INITIAL  5.  Pt will write a short paragraph with 100% legibility and no significant decrease in letter size Baseline: mild micrographia Goal status: INITIAL  6.  Pt will donn/ doff jacket in 2 mins and 30 secs or less in seated. Goal status: INITIAL   ASSESSMENT:  CLINICAL IMPRESSION: Pt is progressing towards goals. Pt/ wife verblize understanding of memory compensations  PERFORMANCE DEFICITS in functional skills including ADLs, IADLs, coordination, dexterity, sensation, ROM, strength, pain, flexibility, FMC, GMC, mobility, balance, endurance, decreased knowledge of precautions, decreased knowledge of use of DME, UE functional use, and bradykinesia, cognitive skills including attention, learn, memory, problem solving, safety awareness, and thought, and psychosocial skills including coping strategies, environmental adaptation, habits, interpersonal interactions, and routines and behaviors.   IMPAIRMENTS are limiting patient from ADLs, IADLs, rest and sleep, play, leisure, and social participation.   COMORBIDITIES may have co-morbidities  that affects occupational performance. Patient will benefit from skilled OT to address above impairments and improve overall function.  MODIFICATION OR ASSISTANCE TO COMPLETE EVALUATION: Min-Moderate modification of tasks or assist with assess necessary to complete an evaluation.  OT OCCUPATIONAL PROFILE AND HISTORY:  Detailed assessment: Review of records and additional review of physical, cognitive, psychosocial history related to current functional performance.  CLINICAL DECISION MAKING: Moderate - several treatment options, min-mod task modification necessary  REHAB POTENTIAL: Good  EVALUATION COMPLEXITY: Moderate    PLAN: OT FREQUENCY: 2x/week  OT DURATION: 12 weeks plus eval  PLANNED INTERVENTIONS: self care/ADL training, therapeutic exercise, therapeutic activity, neuromuscular re-education, manual therapy, passive range of motion, balance training, functional mobility training, aquatic therapy, ultrasound, paraffin, fluidotherapy, moist heat, cryotherapy, contrast bath, patient/family education, cognitive remediation/compensation, visual/perceptual remediation/compensation, energy conservation, coping strategies training, and DME and/or AE instructions  RECOMMENDED OTHER SERVICES: n/a  CONSULTED AND AGREED WITH PLAN OF CARE: Patient and family member/caregiver  PLAN FOR NEXT SESSION issue keeping thinking skills sharp, issue exercise flow sheet, continue ADL strategies   Tyna Huertas, OTR/L 06/22/2022, 1:26 PM  [phone] Phone: [phone] 1:26 PM 06/22/22

## 2022-06-22 NOTE — Therapy (Signed)
OUTPATIENT PHYSICAL THERAPY TREATMENT NOTE   Patient Name: Jordan [name] MRN: [id number] DOB:09/20/1941, 81 y.o., male Today's Date: 06/22/2022  PCP: [name], MD REFERRING PROVIDER: [name], MD      END OF SESSION:   PT End of Session - 06/22/22 1406     Visit Number 11    Number of Visits 17    Date for PT Re-Evaluation 07/21/22    Authorization Type [employer]    PT Start Time 1405   Handoff w/OT   PT Stop Time 1443    PT Time Calculation (min) 38 min    Equipment Utilized During Treatment Gait belt    Activity Tolerance Patient tolerated treatment well    Behavior During Therapy WFL for tasks assessed/performed                 Past Medical History:  Diagnosis Date   Arthritis    R shoulder, bone spur   Arthritis    "hands" (03/25/2016)   Chronic diastolic heart failure (HCC)    Chronic lower back pain    Coronary heart disease    Dr Pernell Dupre   H/O cardiovascular stress test    perhaps last one was 2009   Heart murmur    12/29/11 echo: mild MR, no AS, trivial TR   History of gout    Hyperlipidemia    Hypertension    saw Dr. Linard Millers last earl;y- 2014, cardiac cath. last done ?2009, blocks seen didn't require any intervention at that point.   Narcolepsy    [name] 04-11-97; Mean Latency 1.66mn, SOREM 2   OSA on CPAP    NPSG 12-13-98 AHI 22.7   PONV (postoperative nausea and vomiting)    Presence of permanent cardiac pacemaker    Shortness of breath    with exertion    Stroke ([place]    Type II diabetes mellitus ([place]    Past Surgical History:  Procedure Laterality Date   CARDIAC CATHETERIZATION  2009?   EP IMPLANTABLE DEVICE N/A 03/25/2016   Procedure: Pacemaker Implant - Dual Chamber;  Surgeon: GEvans Lance MD;  [facility];  Service: Cardiovascular;  Laterality: N/A;   FRACTURE SURGERY     INSERT / REPLACE / REMOVE PACEMAKER  03/24/2016   INTRAMEDULLARY (IM) NAIL INTERTROCHANTERIC Left 02/08/2022   Procedure:  INTRAMEDULLARY (IM) NAIL INTERTROCHANTRIC;  Surgeon: DMeredith Pel MD;  [facility]  Service: Orthopedics;  Laterality: Left;   IR KYPHO LUMBAR INC FX REDUCE BONE BX UNI/BIL CANNULATION INC/IMAGING  12/29/2021   IR RADIOLOGIST EVAL & MGMT  12/15/2021   LEFT AND RIGHT HEART CATHETERIZATION WITH CORONARY ANGIOGRAM N/A 06/05/2014   Procedure: LEFT AND RIGHT HEART CATHETERIZATION WITH CORONARY ANGIOGRAM;  Surgeon: HSinclair Grooms MD;  [facility];  Service: Cardiovascular;  Laterality: N/A;   NASAL FRACTURE SURGERY     SHOULDER ARTHROSCOPY WITH ROTATOR CUFF REPAIR AND SUBACROMIAL DECOMPRESSION Right 08/16/2013   Procedure: SHOULDER ARTHROSCOPY WITH ROTATOR CUFF REPAIR AND SUBACROMIAL DECOMPRESSION;  Surgeon: JNita Sells MD;  [facility]  Service: Orthopedics;  Laterality: Right;  Right shoulder arthroscopy rotator cuff repair, subacromial decompression.   TONSILLECTOMY     Patient Active Problem List   Diagnosis Date Noted   Acute deep vein thrombosis (DVT) of left lower extremity ([place] 04/29/2022   Closed intertrochanteric fracture of left hip, initial encounter ([facility]    Closed left hip fracture ([place] secondary to fall at home 02/08/2022  Paroxysmal atrial fibrillation ([place]) 02/08/2022   Debility 01/08/2022   Constipation 01/07/2022   Physical deconditioning 59/16/3846   Acute metabolic encephalopathy 65/99/3570   Hypomagnesemia 01/04/2022   Pancreatitis 12/30/2021   Parkinson's disease ([place]) 12/21/2021   Parkinson disease ([place]) 12/18/2021   Frequent falls 12/18/2021   History of CVA (cerebrovascular accident) 12/18/2021   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   Hallucinations, visual 07/30/2021   Acetabulum fracture, left ([place]) 06/25/2021   Acute hip pain, left 06/24/2021   Fall at home, initial encounter 06/24/2021   Leukocytosis 06/24/2021   Chronic diastolic heart failure ([place])    Sinusitis, acute maxillary 06/24/2020   AKI (acute kidney injury)  ([place])    Labile blood glucose    Cough    Benign essential HTN    Skin lesion of hand    Hypoalbuminemia due to protein-calorie malnutrition ([place])    Controlled type 2 diabetes mellitus with hyperglycemia, without long-term current use of insulin ([place])    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   Morbid obesity ([place])    Acute blood loss anemia    Acute thalamic infarction ([place]) 04/15/2020   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[place]) 05/25/2019   TIA (transient ischemic attack) 02/17/2018   Type 2 diabetes mellitus with hyperlipidemia ([place]) 02/17/2018   Insomnia 02/01/2018   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   Chronotropic incompetence 07/09/2014   Dyspnea on exertion 03/03/2014   HLD (hyperlipidemia) 12/13/2007   Essential hypertension 12/13/2007   Class 1 obesity 12/05/2007   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   Coronary atherosclerosis 12/05/2007    REFERRING DIAG: I63.81 (ICD-10-CM) - 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[place]) G20 (ICD-10-CM) - Parkinson's disease ([place]) S72.145S (ICD-10-CM) - Type III open nondisplaced intertrochanteric fracture of left femur, sequela    THERAPY DIAG:  Muscle weakness (generalized)  Unsteadiness on feet  Other abnormalities of gait and mobility  Rationale for Evaluation and Treatment Rehabilitation  PERTINENT HISTORY: PMHx: PD (diagnosed Jan 2022), HLD, OSA, DM, HTN, chronic diastolic HF, CVA, frequent falls, L2 compression fx, L hip fx   PRECAUTIONS: Fall and ICD/Pacemaker, history of compression fracture at L2 with recent kyphoplasty   SUBJECTIVE: Pt reports he is "okay". Still having pain in abdomen but aspirin helps. Doing exercise "sporadically". No new falls  VITALS Vitals:   06/22/22 1416  BP: (!) 112/56  Pulse: 60     PAIN:  Are you having pain? Yes: NPRS scale: 3/10 Pain location: abdomen Pain description: sharp  OBJECTIVE  TODAY'S TREATMENT:  Self-care/home management  Pt's wife showed therapist photo  of current shower and shower chair set up at home, inquiring about proper grab bar recommendation. PT in agreement w/OT to place grab bars across from shower chair (in horizontal direction) and to R side (in vertical direction) for safe shower transfers. Pt's wife also inquiring about obtaining rollator and therapist advised against it due to safety concerns. PT recommending pt use RW or standard walker at all times as pt relies heavily on BUE support for balance w/gait. Pt's wife verbalized understanding.   Therapeutic Activity  LTG assessment   [facility] PT Assessment - 06/22/22 1420       Berg Balance Test   Sit to Stand Able to stand  independently using hands    Standing Unsupported Able to stand 2 minutes with supervision    Sitting with Back Unsupported but Feet Supported on Floor or Stool Able to sit safely and securely 2 minutes    Stand to Sit Controls descent by using hands  Transfers Able to transfer with verbal cueing and /or supervision    Standing Unsupported with Eyes Closed Able to stand 10 seconds with supervision    Standing Unsupported with Feet Together Able to place feet together independently and stand for 1 minute with supervision    From Standing, Reach Forward with Outstretched Arm Reaches forward but needs supervision   2"   From Standing Position, Pick up Object from [place] to pick up shoe, needs supervision    From Standing Position, Turn to Look Behind Over each Shoulder Turn sideways only but maintains balance    Turn 360 Degrees Needs close supervision or verbal cueing   13.10s to R side   Standing Unsupported, Alternately Place Feet on Step/Stool Needs assistance to keep from falling or unable to try   able to tap w/RLE but not LLE   Standing Unsupported, One Foot in Front Able to take small step independently and hold 30 seconds    Standing on One Leg Unable to try or needs assist to prevent fall   2s on RLE, supported lower onto mat on LLE   Total Score 30     Berg comment: High fall risk                GAIT TRAINING:  Gait pattern: step to pattern, decreased step length- Right, decreased step length- Left, decreased stride length, decreased hip/knee flexion- Right, decreased hip/knee flexion- Left, shuffling, trunk flexed, poor foot clearance- Right, and poor foot clearance- Left Distance walked: Various clinic distances  Assistive device utilized: Walker - 2 wheeled Level of assistance: CGA Comments: Pt continues to demonstrate significant over-reliance on BUEs during gait, cues throughout for upright posture and looking ahead and incr stride length.      PATIENT EDUCATION: Education details: Berg outcome assessment, importance that pt find internal motivation to perform exercises at home for improved carryover from PT, continue HEP Person educated: Patient and Spouse Education method: Explanation Education comprehension: verbalized understanding     HOME EXERCISE PROGRAM: Access Code: [email or web address] URL: [email or web address] Date: 04/23/2022 Prepared by: Estevan Ryder  Exercises - Clamshell  - 1 x daily - 7 x weekly - 3 sets - 10 reps - Seated Scapular Retraction  - 1 x daily - 7 x weekly - 3 sets - 10 reps - Seated Long Arc Quad  - 1 x daily - 7 x weekly - 3 sets - 10 reps - Seated Hamstring Stretch  - 1 x daily - 7 x weekly - 3 sets - 10 reps - Sit to Stand  - 1 x daily - 7 x weekly - 3 sets - 10 reps - Seated Heel Raise  - 1 x daily - 7 x weekly - 3 sets - 10 reps - Seated March  - 1 x daily - 7 x weekly - 3 sets - 10 reps - Seated Flexion Stretch with Swiss Ball  - 1 x daily - 7 x weekly - 3 sets - 10 reps          GOALS: Goals reviewed with patient? Yes   SHORT TERM GOALS: Target date: 05/19/2022   Pt will be able to perform initial HEP for strengthening and balance to continue gains with supervision of wife. Baseline: Goal status: MET   2.  BERG to be assessed with STG/LTG written.  Baseline:  20/56 Goal status: MET   3.  Pt will improve 5x sit<>stand to less than or equal to 19 sec without UE support  to demonstrate improved functional strength and transfer efficiency.   Baseline: 22.6 seconds without UE support from chair; 21seconds without UE support  Goal status: NOT MET   4.  Pt will ambulate >350' with RW mod I for improved household and short community distances. Baseline: requires CGA/MinA/ limited to 230' Goal status: NOT MET   5.  Pt will improve gait speed with RW to at least 1.3 ft/sec in order to demo improved community mobility.    Baseline:  .87 ft/sec with RW; .2f/s (0.362m) Goal status: NOT MET       LONG TERM GOALS: Target date: 07/01/2022 (updated to reflect length of POC)     Pt will be independent with final HEP for strength,gait, balance in order to build upon functional gains made in therapy.   Baseline:  Goal status: INITIAL   2.  Patient will score >25/56 on Berg to demonstrate improved balance Baseline: 20/56; 30/56 on 8/22 Goal status: MET   3.  Pt will improve TUG time with RW vs. LRAD to 26 seconds or less in order to demo decrease fall risk.   Baseline: 33.32 seconds with RW; 30.22 seconds on 06/14/22 Goal status: ONGOING   4.  Pt will improve gait speed with RW vs. LRAD to at least 1.9 ft/sec in order to demo improved community mobility.  Baseline: .87 ft/sec with RW; 29.87 seconds = 1.09 ft/sec on 06/14/22 Goal status: ONGOING    5.  Pt will ambulate >350' with RW versus LRAD on paved surfaces with supervision for improved community mobility. Baseline: 235with RW on indoor surfaces on 06/14/22 Goal status: ONGOING     ASSESSMENT:   CLINICAL IMPRESSION: Emphasis of skilled PT session on LTG assessment and pt education. Pt scored a 30/56 on Berg today, improved from his baseline score of 20/56. Noted the greatest improvement in reaching out of BOS and turns, but pt continues to be a high fall risk. Informed pt that he needs to  find his own motivation to perform exercise at home for improved carryover from PT, as he currently is not doing his HEP or walking consistently. Pt verbalized understanding. Continue POC.     OBJECTIVE IMPAIRMENTS Abnormal gait, decreased activity tolerance, decreased balance, decreased coordination, decreased endurance, decreased knowledge of use of DME, decreased mobility, decreased ROM, decreased strength, impaired flexibility, postural dysfunction, and pain.    ACTIVITY LIMITATIONS cleaning, community activity, driving, meal prep, laundry, yard work, and shopping.    PERSONAL FACTORS Age, Past/current experiences, Time since onset of injury/illness/exacerbation, and 3+ comorbidities:  PD, HLD, OSA, DM, HTN, chronic diastolic HF, CVA, frequent falls, L2 compression fx, L hip fx   are also affecting patient's functional outcome.      REHAB POTENTIAL: Good   CLINICAL DECISION MAKING: Evolving/moderate complexity   EVALUATION COMPLEXITY: Moderate   PLAN: PT FREQUENCY: 2x/week   PT DURATION: 12 weeks, plus eval   PLANNED INTERVENTIONS: Therapeutic exercises, Therapeutic activity, Neuromuscular re-education, Balance training, Gait training, Patient/Family education, Joint mobilization, Stair training, Vestibular training, Canalith repositioning, DME instructions, and Manual therapy   PLAN FOR NEXT SESSION: Walk in with pt from waiting room. Review HEP and update as needed, posture, functional strength/endurance, gait training, weight shifting to L  JaCruzita Ledererlaster, PT, DPT 06/22/2022, 2:43 PM

## 2022-06-22 NOTE — Patient Instructions (Signed)
Memory Compensation Strategies  Use "WARM" strategy. W= write it down A=  associate it R=  repeat it M=  make a mental picture  You can keep a Memory Notebook. Use a 3-ring notebook with sections for the following:  calendar, important names and phone numbers, medications, doctors' names/phone numbers, "to do list"/reminders, and a section to journal what you did each day  Use a calendar to write appointments down.  Write yourself a schedule for the day.  This can be placed on the calendar or in a separate section of the Memory Notebook.  Keeping a regular schedule can help memory.  Use medication organizer with sections for each day or morning/evening pills  You may need help loading it  Keep a basket, or pegboard by the door.   Place items that you need to take out with you in the basket or on the pegboard.  You may also want to include a message board for reminders.  Use sticky notes. Place sticky notes with reminders in a place where the task is performed.  For example:  "turn off the stove" placed by the stove, "lock the door" placed on the door at eye level, "take your medications" on the bathroom mirror or by the place where you normally take your medications  Use alarms, timers, and/or a reminder app. Use while cooking to remind yourself to check on food or as a reminder to take your medicine, or as a reminder to make a call, or as a reminder to perform another task, etc.  Use a voice recorder app or small tape recorder to record important information and notes for yourself. Go back at the end of the day and listen to these.  

## 2022-06-23 ENCOUNTER — Ambulatory Visit (HOSPITAL_COMMUNITY)
Admission: RE | Admit: 2022-06-23 | Discharge: 2022-06-23 | Disposition: A | Discharge status: Home / Self Care | Payer: Medicare (Managed Care) | Source: Ambulatory Visit | Attending: Family Medicine | Admitting: Family Medicine

## 2022-06-23 DIAGNOSIS — K862 Cyst of pancreas: Secondary | ICD-10-CM | POA: Diagnosis not present

## 2022-06-23 DIAGNOSIS — D1803 Hemangioma of intra-abdominal structures: Secondary | ICD-10-CM | POA: Diagnosis not present

## 2022-06-23 DIAGNOSIS — N200 Calculus of kidney: Secondary | ICD-10-CM | POA: Diagnosis not present

## 2022-06-23 DIAGNOSIS — K769 Liver disease, unspecified: Secondary | ICD-10-CM | POA: Diagnosis not present

## 2022-06-23 DIAGNOSIS — K7689 Other specified diseases of liver: Secondary | ICD-10-CM | POA: Diagnosis not present

## 2022-06-23 MED ORDER — GADOBUTROL 1 MMOL/ML IV SOLN
10.0000 mL | Freq: Once | INTRAVENOUS | Status: AC | PRN
  Administered 2022-06-23: 10 mL via INTRAVENOUS

## 2022-06-23 NOTE — Progress Notes (Signed)
Informed of MRI for today.   Device system confirmed to be MRI conditional, with implant date > 6 weeks ago, and no evidence of abandoned or epicardial leads in review of most recent CXR  Interrogation and programming performed by Industry rep.   Tachy-therapies to off if applicable.  Program device back to pre-MRI settings after completion of exam.  Annamaria Helling  06/23/2022 12:47 PM

## 2022-06-23 NOTE — Therapy (Signed)
OUTPATIENT OCCUPATIONAL THERAPY [facility]  Patient Name: Jordan [name] MRN: [id number] DOB:[birth year], 81 y.o., male Today's Date: 06/24/2022  PCP: Dr. Kenton Kingfisher REFERRING PROVIDER: Dr. Ranell Patrick   OT End of Session - 06/24/22 1327     Visit Number 13    Number of Visits 24    Date for OT Re-Evaluation 07/15/22    Authorization Type [employer]    Authorization Time Period met OOP max  VL:MN  Follow Medicare Guidelines    Authorization - Visit Number 12    Authorization - Number of Visits 10    Progress Note Due on Visit 49    OT Start Time 1324   arrived late   OT Stop Time 1402    OT Time Calculation (min) 38 min    Activity Tolerance Patient tolerated treatment well    Behavior During Therapy WFL for tasks assessed/performed                      Past Medical History:  Diagnosis Date   Arthritis    R shoulder, bone spur   Arthritis    "hands" (03/25/2016)   Chronic diastolic heart failure (HCC)    Chronic lower back pain    Coronary heart disease    Dr Pernell Dupre   H/O cardiovascular stress test    perhaps last one was 2009   Heart murmur    12/29/11 echo: mild MR, no AS, trivial TR   History of gout    Hyperlipidemia    Hypertension    saw Dr. Linard Millers last earl;y- 2014, cardiac cath. last done ?2009, blocks seen didn't require any intervention at that point.   Narcolepsy    [name] 04-11-97; Mean Latency 1.52min, SOREM 2   OSA on CPAP    NPSG 12-13-98 AHI 22.7   PONV (postoperative nausea and vomiting)    Presence of permanent cardiac pacemaker    Shortness of breath    with exertion    Stroke ([place])    Type II diabetes mellitus ([place])    Past Surgical History:  Procedure Laterality Date   CARDIAC CATHETERIZATION  2009?   EP IMPLANTABLE DEVICE N/A 03/25/2016   Procedure: Pacemaker Implant - Dual Chamber;  Surgeon: Evans Lance, MD;  Location: [facility];  Service: Cardiovascular;  Laterality: N/A;   FRACTURE SURGERY      INSERT / REPLACE / REMOVE PACEMAKER  03/24/2016   INTRAMEDULLARY (IM) NAIL INTERTROCHANTERIC Left 02/08/2022   Procedure: INTRAMEDULLARY (IM) NAIL INTERTROCHANTRIC;  Surgeon: Meredith Pel, MD;  Location: [facility];  Service: Orthopedics;  Laterality: Left;   IR KYPHO LUMBAR INC FX REDUCE BONE BX UNI/BIL CANNULATION INC/IMAGING  12/29/2021   IR RADIOLOGIST EVAL & MGMT  12/15/2021   LEFT AND RIGHT HEART CATHETERIZATION WITH CORONARY ANGIOGRAM N/A 06/05/2014   Procedure: LEFT AND RIGHT HEART CATHETERIZATION WITH CORONARY ANGIOGRAM;  Surgeon: Sinclair Grooms, MD;  Location: [facility] C[facility];  Service: Cardiovascular;  Laterality: N/A;   NASAL FRACTURE SURGERY     SHOULDER ARTHROSCOPY WITH ROTATOR CUFF REPAIR AND SUBACROMIAL DECOMPRESSION Right 08/16/2013   Procedure: SHOULDER ARTHROSCOPY WITH ROTATOR CUFF REPAIR AND SUBACROMIAL DECOMPRESSION;  Surgeon: [name], MD;  Location: [facility];  Service: Orthopedics;  Laterality: Right;  Right shoulder arthroscopy rotator cuff repair, subacromial decompression.   TONSILLECTOMY     Patient Active Problem List   Diagnosis Date Noted   Acute deep vein thrombosis (DVT) of left lower extremity (HCC)  04/29/2022   Closed intertrochanteric fracture of left hip, initial encounter [facility])    Closed left hip fracture (HCC) secondary to fall at home 02/08/2022   Paroxysmal atrial fibrillation ([place]) 02/08/2022   Debility 01/08/2022   Constipation 01/07/2022   Physical deconditioning 45/80/9983   Acute metabolic encephalopathy 38/25/0539   Hypomagnesemia 01/04/2022   Pancreatitis 12/30/2021   Parkinson's disease ([place]) 12/21/2021   Parkinson disease ([place]) 12/18/2021   Frequent falls 12/18/2021   History of CVA (cerebrovascular accident) 12/18/2021   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   Hallucinations, visual 07/30/2021   Acetabulum fracture, left ([place]) 06/25/2021   Acute hip pain, left 06/24/2021   Fall at home, initial encounter 06/24/2021   Leukocytosis  06/24/2021   Chronic diastolic heart failure (HCC)    Sinusitis, acute maxillary 06/24/2020   AKI (acute kidney injury) ([place])    Labile blood glucose    Cough    Benign essential HTN    Skin lesion of hand    Hypoalbuminemia due to protein-calorie malnutrition ([place])    Controlled type 2 diabetes mellitus with hyperglycemia, without long-term current use of insulin (HCC)    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   Morbid obesity ([place])    Acute blood loss anemia    Acute thalamic infarction ([place]) 04/15/2020   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[place]) 05/25/2019   TIA (transient ischemic attack) 02/17/2018   Type 2 diabetes mellitus with hyperlipidemia ([place]) 02/17/2018   Insomnia 02/01/2018   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   Chronotropic incompetence 07/09/2014   Dyspnea on exertion 03/03/2014   HLD (hyperlipidemia) 12/13/2007   Essential hypertension 12/13/2007   Class 1 obesity 12/05/2007   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 Wife reports that they missed 12:30 PT appt by mistake.  Pt accompanied by:  self, wife   PERTINENT HISTORY: Jordan [name] is an 81 year old male with history of CAD/chronic diastolic CHF, OSA, J6BH, gout, Parkinson's diease with recent hospital admission 02/17-02-22-23 worsening of gait disorder with falls with hallucinations which was treated with medication adjustment and he was discharged to home. He was readmitted on 12/30/21 with abdominal pain  with low grade fever, leucocytosis, lactic acidosis, mild confusion and lethargy secondary to sepsis. He was found to have elevated lipase-165  with inflammatory changes suggestive of primary pancreatitis with secondary inflammation of hepatic flexure of colon or primary sigmoid diverticulitis with secondary extension to pancreatic tail. He was treated with IVF and broad spectrum antibiotics.   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 Pt fell and sustained hip fx on 02/08/22 and pt underwent left  IM nailing.  04/30/22:  hospitalized with blood clot LLE--changed meds and cleared to resume OT/PT.  New diagonis abdominal and liver mass, await CT   Pt accompanied by: self and significant other, spouse, [name]    PRECAUTIONS: Fall and Other: , hx of back kyphoplasty  , pacemaker, hx of L hip fx, s/p IM nailing, hx of shoulder dislocations, hx of LLE  DVT  WEIGHT BEARING RESTRICTIONS No  PAIN:  Are you having pain? Yes, lower back (pain patch on) 0/10 at rest, fluctuates w/ movement. O.T. addressing through posture, reaching ex's  PLOF: Independent prior to CVA in 2021  PATIENT GOALS to be more independent, pt's wife states he would like to be able to drive  OBJECTIVE:   TODAY'S TREATMENT:   Discussed canceling/rescheduling therapy appts next Thurs due to biopsy scheduled next Wednesday.  Wife to check to see if she can reschedule with front desk.  Pt educated in the following:  Keeping Thinking Skills [place]; verbally reviewed writing strategies.  Also organized and established PD exercise chart with OT HEPs.  Issued and reviewed chart use and verbally reviewed OT HEPs on chart.   PATIENT EDUCATION: Education details: Chartered certified accountant; verbally reviewed writing strategies; PD exercise chart issued and reviewed use and verbally reviewed OT HEPs  Person educated: Patient and Spouse Education method:  Explanation and Handouts Education comprehension: verbalized understanding   HOME EXERCISE PROGRAM: Coordination 05/10/22:  cane HEP (shoulder flex, chest press, and shoulder abduction); strategies for cutting food and donning shirt 05/17/22:  strategies for donning sock with sock aide, donning pants with reacher, and writing 06/03/22-ways to prevent future PD complications and community resources-page 1 only, reviewed with patient 06/22/22-memory compensations. 06/24/22:  Keeping Thinking Skills [place] Ar[place]; verbally reviewed writing strategies; PD exercise chart issued and reviewed use      GOALS: Goals reviewed with patient? No  SHORT TERM GOALS: Target date: 05/14/22--extend to 05/27/22 due to missed visits  I with initial HEP Baseline: Goal status: IN PROGRESS, needs reinforcement  2.  Pt will verbalize understanding of adapted strategies to maximize safety and I with ADLs/ IADLs (OZ:YYQMGNO food) Baseline:  Goal status: ongoing, education performed regarding cutting, food and dressing, pt. May benefit from reinforcement  3.  Pt will demonstrate improved UE functional use as evidenced by increasing LUE box/ blocks score by 3 blocks. Baseline: RUE 45, LUE 37 Goal status: 05/27/22:  ONGOING R-37blocks, L-39 blocks   4.  Pt will perform LB dressing with min A demonstrating good safety awareness. Baseline: mod-max A Goal status: PARTIALLY MET - extra time  5.  Pt will consistently donn pullover shirt mod I in a reasonable amount of time Baseline: increased time required and prn min assist Goal status: MET  6.   Pt will verbalize understanding of ways to prevent future PD related complications and PD community resources. Goal status: MET  7. Pt will retrieve items from overhead shelf at 95* with right UE with -15 elbow extension Baseline: 95, -20 Goal status: PARTIALLY MET.  115* with -20* elbow ext.  8. Pt will retrieve items from overhead shelf at 110 with left UE with -10 elbow  extension Baseline: 110, -15 Goal status: Partially MET 05/27/22:  L-125* -15* elbow ext  LONG TERM GOALS: Target date: 07/15/2022    Pt will demonstrate understanding of memory compensations and ways to keep thinking skills sharp  Goal status: Met, memory compensations issued.  Keeping thinking Skills [place] Ard Issued 06/24/22  2.  Pt will demonstrate improved fine motor coordination for ADLs as evidenced by decreasing 9 hole peg test score for bilateral UE's by 3 secs Baseline: RUE 42.47, LUE 42.25 Goal status: INITIAL  3.  Pt will demonstrate improved ease with feeding as evidenced by decreasing PPT#2 (self feeding) by 3 secs Baseline: 16.66 secs Goal status: INITIAL  4.  Pt will demonstrate improved ease with fastening buttons  as evidenced by decreasing 3 button/ unbutton time to : 95 secs Baseline: 1 min 45 secs Goal status: INITIAL  5.  Pt will write a short paragraph with 100% legibility and no significant decrease in letter size Baseline: mild micrographia Goal status: INITIAL  6.  Pt will donn/ doff jacket in 2 mins and 30 secs or less in seated. Goal status: INITIAL   ASSESSMENT:  CLINICAL IMPRESSION: Pt is progressing towards goals. Pt/ wife verblize understanding of ways to keep thinking skills sharp and use of PD exercise flowsheet/chart.  PERFORMANCE DEFICITS in functional skills including ADLs, IADLs, coordination, dexterity, sensation, ROM, strength, pain, flexibility, FMC, GMC, mobility, balance, endurance, decreased knowledge of precautions, decreased knowledge of use of DME, UE functional use, and bradykinesia, cognitive skills including attention, learn, memory, problem solving, safety awareness, and thought, and psychosocial skills including coping strategies, environmental adaptation, habits, interpersonal interactions, and routines and behaviors.   IMPAIRMENTS are limiting patient from ADLs, IADLs, rest and sleep, play, leisure, and social participation.    COMORBIDITIES may have co-morbidities  that affects occupational performance. Patient will benefit from skilled OT to address above impairments and improve overall function.  MODIFICATION OR ASSISTANCE TO COMPLETE EVALUATION: Min-Moderate modification of tasks or assist with assess necessary to complete an evaluation.  OT OCCUPATIONAL PROFILE AND HISTORY: Detailed assessment: Review of records and additional review of physical, cognitive, psychosocial history related to current functional performance.  CLINICAL DECISION MAKING: Moderate - several treatment options, min-mod task modification necessary  REHAB POTENTIAL: Good  EVALUATION COMPLEXITY: Moderate    PLAN: OT FREQUENCY: 2x/week  OT DURATION: 12 weeks plus eval  PLANNED INTERVENTIONS: self care/ADL training, therapeutic exercise, therapeutic activity, neuromuscular re-education, manual therapy, passive range of motion, balance training, functional mobility training, aquatic therapy, ultrasound, paraffin, fluidotherapy, moist heat, cryotherapy, contrast bath, patient/family education, cognitive remediation/compensation, visual/perceptual remediation/compensation, energy conservation, coping strategies training, and DME and/or AE instructions  RECOMMENDED OTHER SERVICES: n/a  CONSULTED AND AGREED WITH PLAN OF CARE: Patient and family member/caregiver  PLAN FOR NEXT SESSION ?check goals, continue ADL strategies, biopsy scheduled Wed--recommended canceling/rescheduling Thurs appt next week   [facility], OTR/L 06/24/2022, 2:22 PM  [phone] Phone: [phone] 2:22 PM 06/24/22

## 2022-06-24 ENCOUNTER — Ambulatory Visit: Payer: Medicare (Managed Care) | Admitting: Physical Therapy

## 2022-06-24 ENCOUNTER — Other Ambulatory Visit: Payer: Self-pay | Admitting: Physician Assistant

## 2022-06-24 ENCOUNTER — Ambulatory Visit: Payer: Medicare (Managed Care) | Admitting: Occupational Therapy

## 2022-06-24 ENCOUNTER — Other Ambulatory Visit: Payer: Self-pay

## 2022-06-24 ENCOUNTER — Ambulatory Visit: Payer: Medicare (Managed Care) | Admitting: Speech Pathology

## 2022-06-24 ENCOUNTER — Telehealth: Payer: Self-pay

## 2022-06-24 ENCOUNTER — Encounter: Payer: Self-pay | Admitting: Occupational Therapy

## 2022-06-24 DIAGNOSIS — R41840 Attention and concentration deficit: Secondary | ICD-10-CM

## 2022-06-24 DIAGNOSIS — R251 Tremor, unspecified: Secondary | ICD-10-CM

## 2022-06-24 DIAGNOSIS — R293 Abnormal posture: Secondary | ICD-10-CM | POA: Diagnosis not present

## 2022-06-24 DIAGNOSIS — R41841 Cognitive communication deficit: Secondary | ICD-10-CM

## 2022-06-24 DIAGNOSIS — R471 Dysarthria and anarthria: Secondary | ICD-10-CM

## 2022-06-24 DIAGNOSIS — R2681 Unsteadiness on feet: Secondary | ICD-10-CM

## 2022-06-24 DIAGNOSIS — R278 Other lack of coordination: Secondary | ICD-10-CM

## 2022-06-24 DIAGNOSIS — R41844 Frontal lobe and executive function deficit: Secondary | ICD-10-CM

## 2022-06-24 DIAGNOSIS — M6281 Muscle weakness (generalized): Secondary | ICD-10-CM

## 2022-06-24 MED ORDER — ENOXAPARIN SODIUM 100 MG/ML IJ SOSY: 100.0000 mg | PREFILLED_SYRINGE | Freq: Two times a day (BID) | INTRAMUSCULAR | 0 refills | Status: DC

## 2022-06-24 NOTE — Progress Notes (Unsigned)
Jordan Mariscal, MD  Donita Brooks D OK for US guided liver mass biopsy.   CT AP - 8/17 - image 18, series 2.   Challenging Bx given location and depth.   (Note, lesion within more caudal right lobe on image 28, series 2 confirmed to be a hemangioma on Abd MRI performed 8/23).   Cathren Harsh

## 2022-06-24 NOTE — Therapy (Signed)
OUTPATIENT SPEECH LANGUAGE PATHOLOGY TREATMENT NOTE   Patient Name: Jordan [name] MRN: [id number] DOB:1941/10/01, 81 y.o., male Today's Date: 06/24/2022  PCP: [name] MD REFERRING PROVIDER: Izora Ribas, MD   END OF SESSION:   End of Session - 06/24/22 1400     Visit Number 14    Number of Visits 25    Date for SLP Re-Evaluation 07/16/22    Authorization Type [employer]  $20 co pay per day    SLP Start Time 66    SLP Stop Time  1445    SLP Time Calculation (min) 45 min    Activity Tolerance Patient tolerated treatment well                 Past Medical History:  Diagnosis Date   Arthritis    R shoulder, bone spur   Arthritis    "hands" (03/25/2016)   Chronic diastolic heart failure (HCC)    Chronic lower back pain    Coronary heart disease    Dr Pernell Dupre   H/O cardiovascular stress test    perhaps last one was 2009   Heart murmur    12/29/11 echo: mild MR, no AS, trivial TR   History of gout    Hyperlipidemia    Hypertension    saw Dr. Linard Millers last earl;y- 2014, cardiac cath. last done ?2009, blocks seen didn't require any intervention at that point.   Narcolepsy    [name] 04-11-97; Mean Latency 1.36mn, SOREM 2   OSA on CPAP    NPSG 12-13-98 AHI 22.7   PONV (postoperative nausea and vomiting)    Presence of permanent cardiac pacemaker    Shortness of breath    with exertion    Stroke ([place]    Type II diabetes mellitus ([place]    Past Surgical History:  Procedure Laterality Date   CARDIAC CATHETERIZATION  2009?   EP IMPLANTABLE DEVICE N/A 03/25/2016   Procedure: Pacemaker Implant - Dual Chamber;  Surgeon: GEvans Lance MD;  [facility];  Service: Cardiovascular;  Laterality: N/A;   FRACTURE SURGERY     INSERT / REPLACE / REMOVE PACEMAKER  03/24/2016   INTRAMEDULLARY (IM) NAIL INTERTROCHANTERIC Left 02/08/2022   Procedure: INTRAMEDULLARY (IM) NAIL INTERTROCHANTRIC;  Surgeon: DMeredith Pel MD;  [facility]   Service: Orthopedics;  Laterality: Left;   IR KYPHO LUMBAR INC FX REDUCE BONE BX UNI/BIL CANNULATION INC/IMAGING  12/29/2021   IR RADIOLOGIST EVAL & MGMT  12/15/2021   LEFT AND RIGHT HEART CATHETERIZATION WITH CORONARY ANGIOGRAM N/A 06/05/2014   Procedure: LEFT AND RIGHT HEART CATHETERIZATION WITH CORONARY ANGIOGRAM;  Surgeon: HSinclair Grooms MD;  [facility];  Service: Cardiovascular;  Laterality: N/A;   NASAL FRACTURE SURGERY     SHOULDER ARTHROSCOPY WITH ROTATOR CUFF REPAIR AND SUBACROMIAL DECOMPRESSION Right 08/16/2013   Procedure: SHOULDER ARTHROSCOPY WITH ROTATOR CUFF REPAIR AND SUBACROMIAL DECOMPRESSION;  Surgeon: JNita Sells MD;  [place] [facility]  Service: Orthopedics;  Laterality: Right;  Right shoulder arthroscopy rotator cuff repair, subacromial decompression.   TONSILLECTOMY     Patient Active Problem List   Diagnosis Date Noted   Acute deep vein thrombosis (DVT) of left lower extremity ([place] 04/29/2022   Closed intertrochanteric fracture of left hip, initial encounter ([facility]    Closed left hip fracture ([place] secondary to fall at home 02/08/2022   Paroxysmal atrial fibrillation ([place] 02/08/2022   Debility 01/08/2022   Constipation 01/07/2022  Physical deconditioning 40/98/1191   Acute metabolic encephalopathy 47/82/9562   Hypomagnesemia 01/04/2022   Pancreatitis 12/30/2021   Parkinson's disease ([place]) 12/21/2021   Parkinson disease ([place]) 12/18/2021   Frequent falls 12/18/2021   History of CVA (cerebrovascular accident) 12/18/2021   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   Hallucinations, visual 07/30/2021   Acetabulum fracture, left ([place]) 06/25/2021   Acute hip pain, left 06/24/2021   Fall at home, initial encounter 06/24/2021   Leukocytosis 06/24/2021   Chronic diastolic heart failure ([place])    Sinusitis, acute maxillary 06/24/2020   AKI (acute kidney injury) ([place])    Labile blood glucose    Cough    Benign essential HTN    Skin lesion of hand     Hypoalbuminemia due to protein-calorie malnutrition ([place])    Controlled type 2 diabetes mellitus with hyperglycemia, without long-term current use of insulin (HCC)    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   Morbid obesity ([place])    Acute blood loss anemia    Acute thalamic infarction ([place]) 04/15/2020   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[place]) 05/25/2019   TIA (transient ischemic attack) 02/17/2018   Type 2 diabetes mellitus with hyperlipidemia ([place]) 02/17/2018   Insomnia 02/01/2018   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   Chronotropic incompetence 07/09/2014   Dyspnea on exertion 03/03/2014   HLD (hyperlipidemia) 12/13/2007   Essential hypertension 12/13/2007   Class 1 obesity 12/05/2007   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 No   OBJECTIVE:    TODAY'S TREATMENT:  06-24-22: SLP provides education on purpose of current speech program being meeting pt's personal goal of communication effectiveness with communication partners. Pt verbalizes agreement and appreciation for education. SLP leads pt through speak out lesson 16. Usual model provided prior to execution and max-A for accurate completion of exercises. Averages this date as follows: sustained vowel 81 dB; counting 77 dB; reading paragraphs 75 dB; cognitive exercise 73 dB. Averages 70 in short conversation following structured practice with usual verbal cues. SLP provided education and coaching for complete completion of HEP.   06-10-22: Pt entered with  suboptimal conversational volume (mid 60s dB), in which pt able to correct to low 70s with occasional to usual non-verbal prompting. Pt reported increased distraction today due to abdominal pain but wanted to continue ST. Focused session on caregiver education and conversational speech to aid patient engagement. Wife endorsed pt exhibited difficulty attending to Speak Out! Lessons at home due to distraction from television and some push-back when wife attempted to provide feedback/cues. Pt identified need to turn off television while completing exercises for optimal attention and maximal benefit with min prompting. SLP suggested completing online Speak Out! Sessions as pt consistently needs modeling and cues to complete exercises accurately. Both agreed to trial online practices. Pt endorsed challenge of reduced breath support during structured practice, in which SLP educated and instructed increasing frequency of breath during practice. Pt told joke over 4-5 minutes with average of 71 dB with intermittent non-verbal cues to maintain targeted conversational volume. Today, pt cleared throat x9 during session with inconsistent awareness. SLP re-educated and  cued throat clear alternatives. Recommended increasing hydration to thin mucous as well.   PATIENT EDUCATION: Education details: see above Person educated: Patient and Spouse Education method: Explanation, Demonstration, and Handouts Education comprehension: verbalized understanding, returned demonstration, and needs further education     HOME EXERCISE PROGRAM: Speak Out daily     GOALS: Goals reviewed with patient? Yes   SHORT TERM GOALS: Target date: 05/19/2022     Pt will complete Speak Out! HEP at least 1x/day given occasional min A over 2 sessions  Baseline: 05-17-22 Goal status: Met   2.  Pt will achieve targeted dB (85-90 dB) on warm up exercises with 80% accuracy given occasional min A over 2 sessions  Baseline:  Goal status: Partially  Met   3.  Pt will achieve targeted dB (75-85 dB) on reading exercises with 80% accuracy given occasional min A over 2 sessions Baseline: 05-10-22, 05-17-22 Goal status: Met   4.  Pt will achieve targeted dB (72-78 dB) on cognitive exercises with 80% accuracy given occasional min A over 2 sessions Baseline: 05-10-22, 7-11-07-21 Goal status: Met   5.  Pt will utilize dysarthria compensations in 5-10 minute conversation to optimize vocal intensity and clarity given occasional mod A over 2 sessions  Baseline:  Goal status: Partially Met   6.  Pt will implement memory/attention compensations to aid daily functioning given occasional mod A over 2 sessions  Baseline:  Goal status: Partially Met   LONG TERM GOALS: Target date: 07/16/2022    Pt will complete Speak Out! HEP at least 1x/day (BID recommended) > 1 week  Baseline:  Goal status: ongoing   2.  Pt will achieve targeted dB levels in demonstration of Speak Out! Lessons with 90% accuracy given rare min A over 2 sessions  Baseline:  Goal status: ongoing   3.  Pt will utilize dysarthria compensations in 15+ minute conversation to optimize vocal intensity and clarity given occasional min A over 2 sessions Baseline:  Goal status: ongoing   4.  Pt will implement memory/attention compensations to aid daily functioning given occasional min A over 2 sessions  Baseline:  Goal status: ongoing   5.  Pt will report improved communication effectiveness via PROM by 2 point improvement by last ST session  Baseline: CPIB=26 Goal status: ongoing   ASSESSMENT:   CLINICAL IMPRESSION: Patient is a 81 y.o. male who was seen today for previous thalamic stroke and Parkinson's disease. 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 Conducted ongoing education and training with patient and wife on Speak Out! Program to optimize vocal intensity and clarity.  Usual modeling and cues required to aid patient comprehension and implementation of targeted techniques to maximize speech intelligibility. Skilled ST is warranted to address dysarthria and cognition to maximize communication effectiveness and functional independence.    OBJECTIVE IMPAIRMENTS  Objective impairments include attention, memory, and dysarthria. These impairments are limiting patient from household responsibilities and effectively communicating at home and in community.Factors affecting potential to achieve goals and functional outcome are medical prognosis and previous level of function. Patient will benefit from skilled SLP services to address above impairments and improve overall function.   REHAB POTENTIAL: Good   PLAN: SLP FREQUENCY: 2x/week   SLP DURATION: 12 weeks   PLANNED INTERVENTIONS: Cueing hierachy, Cognitive reorganization, Internal/external aids, Functional tasks, Multimodal communication approach, SLP instruction and feedback, Compensatory strategies, and Patient/family education  Su Monks, CCC-SLP 06/24/2022, 2:09  PM

## 2022-06-24 NOTE — Telephone Encounter (Signed)
T/C to pt's wife, [name], to advise of anticoagulation recommendations.  Liver biopsy is scheduled for 8/30.  Pt is to hold xarelto 48 hrs prior and start lovenox inj BID and hold for 12 hrs before.  He will resume xarelto the day after his biopsy.  Pt's wife voiced understanding but will call if she has any questions or concerns

## 2022-06-24 NOTE — Patient Instructions (Addendum)
 (  Exercise) Monday Tuesday Wednesday Thursday Friday Saturday Sunday   [facility] Exercises           PWR! Sitting            Coordination Exercises           Thinking Activities                                                      Keeping Thinking Skills Sharp: 1. Jigsaw puzzles 2. Card/board games 3. Talking on the phone/social events 4. [email or web address] 5. Online games 6. Word serches/crossword puzzles 7.  Logic puzzles 8.  Aerobic exercise (stationary bike) 9.  Eating balanced diet (fruits & veggies) 10. Drink water 11. Try something new--new recipe, hobby 12. Crafts 13. Do a variety of activities that are challenging

## 2022-06-29 ENCOUNTER — Ambulatory Visit: Payer: Medicare (Managed Care) | Admitting: Occupational Therapy

## 2022-06-29 ENCOUNTER — Ambulatory Visit: Payer: Medicare (Managed Care) | Admitting: Physical Therapy

## 2022-06-29 ENCOUNTER — Other Ambulatory Visit: Payer: Self-pay | Admitting: Radiology

## 2022-06-29 ENCOUNTER — Ambulatory Visit: Payer: Medicare (Managed Care) | Admitting: Speech Pathology

## 2022-06-29 ENCOUNTER — Encounter: Payer: Self-pay | Admitting: Physical Therapy

## 2022-06-29 VITALS — BP 125/73 | HR 66

## 2022-06-29 DIAGNOSIS — R293 Abnormal posture: Secondary | ICD-10-CM

## 2022-06-29 DIAGNOSIS — M6281 Muscle weakness (generalized): Secondary | ICD-10-CM

## 2022-06-29 DIAGNOSIS — R278 Other lack of coordination: Secondary | ICD-10-CM

## 2022-06-29 DIAGNOSIS — R251 Tremor, unspecified: Secondary | ICD-10-CM

## 2022-06-29 DIAGNOSIS — R471 Dysarthria and anarthria: Secondary | ICD-10-CM

## 2022-06-29 DIAGNOSIS — R41844 Frontal lobe and executive function deficit: Secondary | ICD-10-CM

## 2022-06-29 DIAGNOSIS — R160 Hepatomegaly, not elsewhere classified: Secondary | ICD-10-CM

## 2022-06-29 DIAGNOSIS — H8111 Benign paroxysmal vertigo, right ear: Secondary | ICD-10-CM

## 2022-06-29 DIAGNOSIS — R2689 Other abnormalities of gait and mobility: Secondary | ICD-10-CM

## 2022-06-29 DIAGNOSIS — R41840 Attention and concentration deficit: Secondary | ICD-10-CM

## 2022-06-29 DIAGNOSIS — R2681 Unsteadiness on feet: Secondary | ICD-10-CM

## 2022-06-29 DIAGNOSIS — R41841 Cognitive communication deficit: Secondary | ICD-10-CM

## 2022-06-29 NOTE — Therapy (Signed)
OUTPATIENT SPEECH LANGUAGE PATHOLOGY TREATMENT NOTE   Patient Name: Jordan [name] MRN: [id number] DOB:Aug 31, 1941, 81 y.o., male Today's Date: 06/29/2022  PCP: [name] MD REFERRING PROVIDER: Izora Ribas, MD   END OF SESSION:   End of Session - 06/29/22 1401     Visit Number 15    Number of Visits 25    Date for SLP Re-Evaluation 07/16/22    Authorization Type [employer] Medicare  $20 co pay per day    SLP Start Time 1401    SLP Stop Time  1445    SLP Time Calculation (min) 44 min    Activity Tolerance Patient tolerated treatment well                  Past Medical History:  Diagnosis Date   Arthritis    R shoulder, bone spur   Arthritis    "hands" (03/25/2016)   Chronic diastolic heart failure (HCC)    Chronic lower back pain    Coronary heart disease    Dr Pernell Dupre   H/O cardiovascular stress test    perhaps last one was 2009   Heart murmur    12/29/11 echo: mild MR, no AS, trivial TR   History of gout    Hyperlipidemia    Hypertension    saw Dr. Linard Millers last earl;y- 2014, cardiac cath. last done ?2009, blocks seen didn't require any intervention at that point.   Narcolepsy    [name] 04-11-97; Mean Latency 1.12mn, SOREM 2   OSA on CPAP    NPSG 12-13-98 AHI 22.7   PONV (postoperative nausea and vomiting)    Presence of permanent cardiac pacemaker    Shortness of breath    with exertion    Stroke ([place]    Type II diabetes mellitus ([place]    Past Surgical History:  Procedure Laterality Date   CARDIAC CATHETERIZATION  2009?   EP IMPLANTABLE DEVICE N/A 03/25/2016   Procedure: Pacemaker Implant - Dual Chamber;  Surgeon: GEvans Lance MD;  [facility];  Service: Cardiovascular;  Laterality: N/A;   FRACTURE SURGERY     INSERT / REPLACE / REMOVE PACEMAKER  03/24/2016   INTRAMEDULLARY (IM) NAIL INTERTROCHANTERIC Left 02/08/2022   Procedure: INTRAMEDULLARY (IM) NAIL INTERTROCHANTRIC;  Surgeon: DMeredith Pel MD;  [facility]   Service: Orthopedics;  Laterality: Left;   IR KYPHO LUMBAR INC FX REDUCE BONE BX UNI/BIL CANNULATION INC/IMAGING  12/29/2021   IR RADIOLOGIST EVAL & MGMT  12/15/2021   LEFT AND RIGHT HEART CATHETERIZATION WITH CORONARY ANGIOGRAM N/A 06/05/2014   Procedure: LEFT AND RIGHT HEART CATHETERIZATION WITH CORONARY ANGIOGRAM;  Surgeon: HSinclair Grooms MD;  [facility];  Service: Cardiovascular;  Laterality: N/A;   NASAL FRACTURE SURGERY     SHOULDER ARTHROSCOPY WITH ROTATOR CUFF REPAIR AND SUBACROMIAL DECOMPRESSION Right 08/16/2013   Procedure: SHOULDER ARTHROSCOPY WITH ROTATOR CUFF REPAIR AND SUBACROMIAL DECOMPRESSION;  Surgeon: JNita Sells MD;  [place] [facility]  Service: Orthopedics;  Laterality: Right;  Right shoulder arthroscopy rotator cuff repair, subacromial decompression.   TONSILLECTOMY     Patient Active Problem List   Diagnosis Date Noted   Acute deep vein thrombosis (DVT) of left lower extremity ([place] 04/29/2022   Closed intertrochanteric fracture of left hip, [facility] encounter [facility]    Closed left hip fracture ([place] secondary to fall at home 02/08/2022   Paroxysmal atrial fibrillation ([place] 02/08/2022   Debility 01/08/2022   Constipation 01/07/2022  Physical deconditioning 18/84/1660   Acute metabolic encephalopathy 63/11/6008   Hypomagnesemia 01/04/2022   Pancreatitis 12/30/2021   Parkinson's disease ([place]) 12/21/2021   Parkinson disease ([place]) 12/18/2021   Frequent falls 12/18/2021   History of CVA (cerebrovascular accident) 12/18/2021   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   Hallucinations, visual 07/30/2021   Acetabulum fracture, left ([place]) 06/25/2021   Acute hip pain, left 06/24/2021   Fall at home, initial encounter 06/24/2021   Leukocytosis 06/24/2021   Chronic diastolic heart failure ([place])    Sinusitis, acute maxillary 06/24/2020   AKI (acute kidney injury) ([place])    Labile blood glucose    Cough    Benign essential HTN    Skin lesion of hand     Hypoalbuminemia due to protein-calorie malnutrition ([place])    Controlled type 2 diabetes mellitus with hyperglycemia, without long-term current use of insulin (HCC)    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   Morbid obesity ([place])    Acute blood loss anemia    Acute thalamic infarction ([place]) 04/15/2020   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[place]) 05/25/2019   TIA (transient ischemic attack) 02/17/2018   Type 2 diabetes mellitus with hyperlipidemia ([place]) 02/17/2018   Insomnia 02/01/2018   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   Chronotropic incompetence 07/09/2014   Dyspnea on exertion 03/03/2014   HLD (hyperlipidemia) 12/13/2007   Essential hypertension 12/13/2007   Class 1 obesity 12/05/2007   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 No   OBJECTIVE:    TODAY'S TREATMENT:  06-29-22: Pt reports limited carryover of HEP, is perseverating on details of Speak Out lessons, such as how stories are written. Is not practicing the exercises. Led pt through structured conversation in which pt demonstrated little carryover of intentional speech, in spite of usual max-A verbal cues, visual cues and modeling. SLP provided brief education on role of dopamine in speech production and purpose of using intent to aid in improved clarity when communicating. Pt verbalized understanding, following questions which SLP answered to pt's satisfaction. Provided handout.   06-24-22: SLP provides education on purpose of current speech program being meeting pt's  personal goal of communication effectiveness with communication partners. Pt verbalizes agreement and appreciation for education. SLP leads pt through speak out lesson 16. Usual model provided prior to execution and max-A for accurate completion of exercises. Averages this date as follows: sustained vowel 81 dB; counting 77 dB; reading paragraphs 75 dB; cognitive exercise 73 dB. Averages 70 in short conversation following structured practice with usual verbal cues. SLP provided education and coaching for complete completion of HEP.   06-10-22: Pt entered with suboptimal conversational volume (mid 60s dB), in which pt able to correct to low 70s with occasional to usual non-verbal prompting. Pt reported increased distraction today due to abdominal pain but wanted to continue ST. Focused session on caregiver education and conversational speech to aid patient engagement. Wife endorsed pt exhibited difficulty attending to Speak Out! Lessons at home due to distraction from television and some push-back when wife attempted to provide feedback/cues. Pt identified need to turn off television while completing exercises for optimal attention and  maximal benefit with min prompting. SLP suggested completing online Speak Out! Sessions as pt consistently needs modeling and cues to complete exercises accurately. Both agreed to trial online practices. Pt endorsed challenge of reduced breath support during structured practice, in which SLP educated and instructed increasing frequency of breath during practice. Pt told joke over 4-5 minutes with average of 71 dB with intermittent non-verbal cues to maintain targeted conversational volume. Today, pt cleared throat x9 during session with inconsistent awareness. SLP re-educated and cued throat clear alternatives. Recommended increasing hydration to thin mucous as well.   PATIENT EDUCATION: Education details: see above Person educated: Patient and Spouse Education method:  Explanation, Demonstration, and Handouts Education comprehension: verbalized understanding, returned demonstration, and needs further education     HOME EXERCISE PROGRAM: Speak Out daily     GOALS: Goals reviewed with patient? Yes   SHORT TERM GOALS: Target date: 05/19/2022     Pt will complete Speak Out! HEP at least 1x/day given occasional min A over 2 sessions  Baseline: 05-17-22 Goal status: Met   2.  Pt will achieve targeted dB (85-90 dB) on warm up exercises with 80% accuracy given occasional min A over 2 sessions  Baseline:  Goal status: Partially Met   3.  Pt will achieve targeted dB (75-85 dB) on reading exercises with 80% accuracy given occasional min A over 2 sessions Baseline: 05-10-22, 05-17-22 Goal status: Met   4.  Pt will achieve targeted dB (72-78 dB) on cognitive exercises with 80% accuracy given occasional min A over 2 sessions Baseline: 05-10-22, 7-11-07-21 Goal status: Met   5.  Pt will utilize dysarthria compensations in 5-10 minute conversation to optimize vocal intensity and clarity given occasional mod A over 2 sessions  Baseline:  Goal status: Partially Met   6.  Pt will implement memory/attention compensations to aid daily functioning given occasional mod A over 2 sessions  Baseline:  Goal status: Partially Met   LONG TERM GOALS: Target date: 07/16/2022    Pt will complete Speak Out! HEP at least 1x/day (BID recommended) > 1 week  Baseline:  Goal status: ongoing   2.  Pt will achieve targeted dB levels in demonstration of Speak Out! Lessons with 90% accuracy given rare min A over 2 sessions  Baseline:  Goal status: ongoing   3.  Pt will utilize dysarthria compensations in 15+ minute conversation to optimize vocal intensity and clarity given occasional min A over 2 sessions Baseline:  Goal status: ongoing   4.  Pt will implement memory/attention compensations to aid daily functioning given occasional min A over 2 sessions  Baseline:  Goal status:  ongoing   5.  Pt will report improved communication effectiveness via PROM by 2 point improvement by last ST session  Baseline: CPIB=26 Goal status: ongoing   ASSESSMENT:   CLINICAL IMPRESSION: Patient is a 81 y.o. male who was seen today for previous thalamic stroke and Parkinson's disease. 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 Conducted ongoing education and training with patient and wife on Speak Out! Program to optimize vocal intensity and clarity. Usual modeling and cues required to aid patient comprehension and implementation of targeted techniques to maximize speech intelligibility. Skilled ST is warranted to address dysarthria and cognition to maximize communication effectiveness and functional independence.    OBJECTIVE IMPAIRMENTS  Objective impairments include attention, memory, and dysarthria. These impairments are limiting patient from household responsibilities and effectively communicating  at home and in community.Factors affecting potential to achieve goals and functional outcome are medical prognosis and previous level of function. Patient will benefit from skilled SLP services to address above impairments and improve overall function.   REHAB POTENTIAL: Good   PLAN: SLP FREQUENCY: 2x/week   SLP DURATION: 12 weeks   PLANNED INTERVENTIONS: Cueing hierachy, Cognitive reorganization, Internal/external aids, Functional tasks, Multimodal communication approach, SLP instruction and feedback, Compensatory strategies, and Patient/family education  Su Monks, CCC-SLP 06/29/2022, 2:10 PM

## 2022-06-29 NOTE — Patient Instructions (Signed)
Dopamine and your speech:  Speech is usually an automatic motor movement- meaning you don't normally have to think about it   Automatic movements are powered by dopamine. Parkinson's is a disorder which causes you to lose the ability to produce this important chemical.  Therefore, our speech is not as clear (or loud).   This is why we practice using INTENT (purpose) when speaking. With intent, your voice/speech can be clearer and more easily understood. Your brain uses a different pathway to tell your muscles to do something with intent.    Dopamine can also impact motivation, this may be why you sometimes have a hard time getting motivated to do things you need to do.     [facility]

## 2022-06-29 NOTE — Therapy (Signed)
OUTPATIENT PHYSICAL THERAPY TREATMENT NOTE   Patient Name: Jordan [name] MRN: [id number] DOB:May 14, 1941, 81 y.o., male Today's Date: 06/30/2022  PCP: [name], MD REFERRING PROVIDER: [name], MD   END OF SESSION:   PT End of Session - 06/29/22 1451     Visit Number 12    Number of Visits 17    Date for PT Re-Evaluation 07/21/22    Authorization Type [employer]    PT Start Time 1448    PT Stop Time 1530    PT Time Calculation (min) 42 min    Equipment Utilized During Treatment Gait belt    Activity Tolerance Patient tolerated treatment well    Behavior During Therapy WFL for tasks assessed/performed                 Past Medical History:  Diagnosis Date   Arthritis    R shoulder, bone spur   Arthritis    "hands" (03/25/2016)   Chronic diastolic heart failure (HCC)    Chronic lower back pain    Coronary heart disease    Dr Pernell Dupre   H/O cardiovascular stress test    perhaps last one was 2009   Heart murmur    12/29/11 echo: mild MR, no AS, trivial TR   History of gout    Hyperlipidemia    Hypertension    saw Dr. Linard Millers last earl;y- 2014, cardiac cath. last done ?2009, blocks seen didn't require any intervention at that point.   Narcolepsy    [name] 04-11-97; Mean Latency 1.66mn, SOREM 2   OSA on CPAP    NPSG 12-13-98 AHI 22.7   PONV (postoperative nausea and vomiting)    Presence of permanent cardiac pacemaker    Shortness of breath    with exertion    Stroke ([facility]    Type II diabetes mellitus ([facility] [place]    Past Surgical History:  Procedure Laterality Date   CARDIAC CATHETERIZATION  2009?   EP IMPLANTABLE DEVICE N/A 03/25/2016   Procedure: Pacemaker Implant - Dual Chamber;  Surgeon: GEvans Lance MD;  [facility];  Service: Cardiovascular;  Laterality: N/A;   FRACTURE SURGERY     INSERT / REPLACE / REMOVE PACEMAKER  03/24/2016   INTRAMEDULLARY (IM) NAIL INTERTROCHANTERIC Left 02/08/2022   Procedure: INTRAMEDULLARY (IM)  NAIL INTERTROCHANTRIC;  Surgeon: DMeredith Pel MD;  [facility]  Service: Orthopedics;  Laterality: Left;   IR KYPHO LUMBAR INC FX REDUCE BONE BX UNI/BIL CANNULATION INC/IMAGING  12/29/2021   IR RADIOLOGIST EVAL & MGMT  12/15/2021   LEFT AND RIGHT HEART CATHETERIZATION WITH CORONARY ANGIOGRAM N/A 06/05/2014   Procedure: LEFT AND RIGHT HEART CATHETERIZATION WITH CORONARY ANGIOGRAM;  Surgeon: HSinclair Grooms MD;  [facility];  Service: Cardiovascular;  Laterality: N/A;   NASAL FRACTURE SURGERY     SHOULDER ARTHROSCOPY WITH ROTATOR CUFF REPAIR AND SUBACROMIAL DECOMPRESSION Right 08/16/2013   Procedure: SHOULDER ARTHROSCOPY WITH ROTATOR CUFF REPAIR AND SUBACROMIAL DECOMPRESSION;  Surgeon: JNita Sells MD;  [facility]  Service: Orthopedics;  Laterality: Right;  Right shoulder arthroscopy rotator cuff repair, subacromial decompression.   TONSILLECTOMY     Patient Active Problem List   Diagnosis Date Noted   Acute deep vein thrombosis (DVT) of left lower extremity ([facility] 04/29/2022   Closed intertrochanteric fracture of left hip, initial encounter ([facility]    Closed left hip fracture ([facility] secondary to fall at home 02/08/2022   Paroxysmal atrial fibrillation ([facility]  02/08/2022   Debility 01/08/2022   Constipation 01/07/2022   Physical deconditioning 40/98/1191   Acute metabolic encephalopathy 47/82/9562   Hypomagnesemia 01/04/2022   Pancreatitis 12/30/2021   Parkinson's disease ([place]) 12/21/2021   Parkinson disease ([place]) 12/18/2021   Frequent falls 12/18/2021   History of CVA (cerebrovascular accident) 12/18/2021   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   Hallucinations, visual 07/30/2021   Acetabulum fracture, left ([place]) 06/25/2021   Acute hip pain, left 06/24/2021   Fall at home, initial encounter 06/24/2021   Leukocytosis 06/24/2021   Chronic diastolic heart failure ([place])    Sinusitis, acute maxillary 06/24/2020   AKI (acute kidney injury) ([place])    Labile  blood glucose    Cough    Benign essential HTN    Skin lesion of hand    Hypoalbuminemia due to protein-calorie malnutrition ([place])    Controlled type 2 diabetes mellitus with hyperglycemia, without long-term current use of insulin ([place])    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   Morbid obesity ([place])    Acute blood loss anemia    Acute thalamic infarction ([place]) 04/15/2020   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[place]) 05/25/2019   TIA (transient ischemic attack) 02/17/2018   Type 2 diabetes mellitus with hyperlipidemia ([place]) 02/17/2018   Insomnia 02/01/2018   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   Chronotropic incompetence 07/09/2014   Dyspnea on exertion 03/03/2014   HLD (hyperlipidemia) 12/13/2007   Essential hypertension 12/13/2007   Class 1 obesity 12/05/2007   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   Coronary atherosclerosis 12/05/2007    REFERRING DIAG: I63.81 (ICD-10-CM) - 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[place]) G20 (ICD-10-CM) - Parkinson's disease ([place]) S72.145S (ICD-10-CM) - Type III open nondisplaced intertrochanteric fracture of left femur, sequela    THERAPY DIAG:  Abnormal posture  Unsteadiness on feet  Muscle weakness (generalized)  Other abnormalities of gait and mobility  BPPV (benign paroxysmal positional vertigo), right  Rationale for Evaluation and Treatment Rehabilitation  PERTINENT HISTORY: PMHx: PD (diagnosed Jan 2022), HLD, OSA, DM, HTN, chronic diastolic HF, CVA, frequent falls, L2 compression fx, L hip fx   PRECAUTIONS: Fall and ICD/Pacemaker, history of compression fracture at L2 with recent kyphoplasty   SUBJECTIVE: No falls. Getting a biopsy of his liver tomorrow. Still having the dizziness, felt like he was falling out of the bed. Asking about having this checked today.   VITALS Vitals:   06/29/22 1512  BP: 125/73  Pulse: 66      PAIN:  Are you having pain? Yes: NPRS scale: 2/10 Pain location: abdomen Pain description:  sharp  OBJECTIVE  TODAY'S TREATMENT:  VESTIBULAR ASSESSMENT:            POSITIONAL TESTING: Right Dix-Hallpike: upbeating, right nystagmus, lasting approx. 15 seconds, dizziness  Right Sidelying: very mild upbeating right nystagmus, lasting only about 5 seconds   Did not get to re-assess nystagmus in R Dix-Hallpike at end of session after Epley due to time constraints.   VESTIBULAR TREATMENT:   Canalith Repositioning: Epley Right: Number of Reps: 1, Response to Treatment: symptoms improved, and Comment: Pt with mild dizziness when coming to sit upright.     Performed with 4" risers under mat table to assist with extension due to pt having significantly decr ROM. Had 2nd PT help with bed mobility with pt's trunk, rolling, and bringing legs on and off mat table.     Performed Nestor Lewandowsky x3 reps each side for HEP for potential habituation/movement to see if BPPV can be cleared with repeated sidelying  as pt with significant ROM limitations and stiffness. Pt needs cues and to turn head first (pt with limited ROM). Pt only noted with nystagmus during first rep, unable to see during remainder of reps with pt reporting mild dizziness. Pt's nystagmus seems to be mainly visible with R Dix Hallpike position.  Pt only needing help bringing legs on and off the mat table that pt's spouse can help with at home.     Therapeutic Activity:  Discussed with pt and pt's spouse that originally had planned to wrap up at the end of this week due to lack of significant progress towards goals and pt not performing walking program or HEP at home, but instead will add an additional 2x week for 2 weeks to try to treat BPPV in order to help decr dizziness. Educated on purpose of PD screens and will have pt schedule for one In 6 months. Pt's spouse and pt in agreement with plan.     PATIENT EDUCATION: Education details: See therapeutic activity section above, continued BPPV and potential treatment.Added Nestor Lewandowsky to HEP to see if it will help clear BPPV at home as pt with mobility deficits for Epley and pt's spouse would need to help with verbal cues and bringing legs on and off the bed.  Person educated: Patient and Spouse Education method: Explanation, Demonstration, Handout Education comprehension: verbalized understanding, needs further instructions     HOME EXERCISE PROGRAM: Access Code: [email or web address] URL: [email or web address] Date: 06/29/2022 Prepared by: Janann August  Exercises - Clamshell  - 1 x daily - 7 x weekly - 3 sets - 10 reps - Seated Scapular Retraction  - 1 x daily - 7 x weekly - 3 sets - 10 reps - Seated Long Arc Quad  - 1 x daily - 7 x weekly - 3 sets - 10 reps - Seated Hamstring Stretch  - 1 x daily - 7 x weekly - 3 sets - 10 reps - Sit to Stand  - 1 x daily - 7 x weekly - 3 sets - 10 reps - Seated Heel Raise  - 1 x daily - 7 x weekly - 3 sets - 10 reps - Seated March  - 1 x daily - 7 x weekly - 3 sets - 10 reps - Seated Flexion Stretch with Swiss Ball  - 1 x daily - 7 x weekly - 3 sets - 10 reps - Brandt-Daroff Vestibular Exercise  - 1 x daily - 5 x weekly - 3 sets     GOALS: Goals reviewed with patient? Yes   SHORT TERM GOALS: Target date: 05/19/2022   Pt will be able to perform initial HEP for strengthening and balance to continue gains with supervision of wife. Baseline: Goal status: MET   2.  BERG to be assessed with STG/LTG written.  Baseline: 20/56 Goal status: MET   3.  Pt will improve 5x sit<>stand to less than or equal to 19 sec without UE support to demonstrate improved functional strength and transfer efficiency.   Baseline: 22.6 seconds without UE support from chair; 21seconds without UE support  Goal status: NOT MET   4.  Pt will ambulate >350' with RW mod I for improved household and short community distances. Baseline: requires CGA/MinA/ limited to 230' Goal status: NOT MET   5.  Pt will improve gait speed with RW to at least  1.3 ft/sec in order to demo improved community mobility.    Baseline:  .87 ft/sec with RW; .69f/s (0.315m) Goal  status: NOT MET       LONG TERM GOALS: Target date: 07/01/2022 (updated to reflect length of POC)     Pt will be independent with final HEP for strength,gait, balance in order to build upon functional gains made in therapy.   Baseline:  Goal status: INITIAL   2.  Patient will score >25/56 on Berg to demonstrate improved balance Baseline: 20/56; 30/56 on 8/22 Goal status: MET   3.  Pt will improve TUG time with RW vs. LRAD to 26 seconds or less in order to demo decrease fall risk.   Baseline: 33.32 seconds with RW; 30.22 seconds on 06/14/22 Goal status: ONGOING   4.  Pt will improve gait speed with RW vs. LRAD to at least 1.9 ft/sec in order to demo improved community mobility.  Baseline: .87 ft/sec with RW; 29.87 seconds = 1.09 ft/sec on 06/14/22 Goal status: ONGOING    5.  Pt will ambulate >350' with RW versus LRAD on paved surfaces with supervision for improved community mobility. Baseline: 104' with RW on indoor surfaces on 06/14/22 Goal status: ONGOING     ASSESSMENT:   CLINICAL IMPRESSION: Pt reporting that he has dizziness that has returned with bed mobility. Pt continues with R upbeating rotary nystagmus lasting ~15 seconds in R [employer] position, indicating continued R BPPV. Treated with 1 rep of Epley with 2nd person needed to assist with bed mobility and 4" risers needed to help with extension as pt with limited ROM. Did not get to re-assess at end of session. Provided Nestor Lewandowsky exercises for home to have wife assist pt with to see if repeated sidelying will help clear it. Will continue per POC.     OBJECTIVE IMPAIRMENTS Abnormal gait, decreased activity tolerance, decreased balance, decreased coordination, decreased endurance, decreased knowledge of use of DME, decreased mobility, decreased ROM, decreased strength, impaired flexibility, postural  dysfunction, and pain.    ACTIVITY LIMITATIONS cleaning, community activity, driving, meal prep, laundry, yard work, and shopping.    PERSONAL FACTORS Age, Past/current experiences, Time since onset of injury/illness/exacerbation, and 3+ comorbidities:  PD, HLD, OSA, DM, HTN, chronic diastolic HF, CVA, frequent falls, L2 compression fx, L hip fx   are also affecting patient's functional outcome.      REHAB POTENTIAL: Good   CLINICAL DECISION MAKING: Evolving/moderate complexity   EVALUATION COMPLEXITY: Moderate   PLAN: PT FREQUENCY: 2x/week   PT DURATION: 12 weeks, plus eval   PLANNED INTERVENTIONS: Therapeutic exercises, Therapeutic activity, Neuromuscular re-education, Balance training, Gait training, Patient/Family education, Joint mobilization, Stair training, Vestibular training, Canalith repositioning, DME instructions, and Manual therapy   PLAN FOR NEXT SESSION: Walk in with pt from waiting room. Review current HEP and update as needed. And do any other exercise. Will just use remainder of current visits to try to clear BPPV. Did they try Nestor Lewandowsky exercises from home?   Arliss Journey, PT, DPT 06/30/2022, 4:11 PM

## 2022-06-29 NOTE — Patient Instructions (Addendum)
Coordination Exercises   Perform the following exercises for 20 minutes 1 times per day. Perform with both hand(s). Perform using big movements.  -----------Flipping Cards: Place deck of cards on the table. Flip cards over by opening your hand big to grasp and then turn your palm up big  ------------Deal cards: Hold 1/2 or whole deck in your hand. Use thumb to push card off top of deck with one big push  -----------Pick up coins and place in coin bank or container: Pick up with big, intentional movements. Do not drag coin to the edge.  ----------Pick up coins and stack one at a time: Pick up with big, intentional movements. Do not drag coin to the edge. (5-10 in a stack)  --------Pick up 5-10 coins one at a time and hold in palm. Then, move coins from palm to fingertips one at time and place in coin bank/container.  ---------Practice writing: Slow down, write big, and focus on forming each letter.   --------- Practice fastening Buttons

## 2022-06-29 NOTE — Therapy (Signed)
OUTPATIENT OCCUPATIONAL THERAPY [facility]  Patient Name: Jordan [name] MRN: [id number] DOB:10-Dec-1940, 81 y.o., male Today's Date: 06/29/2022  PCP: Dr. Kenton Kingfisher REFERRING PROVIDER: Dr. Ranell Patrick OCCUPATIONAL THERAPY DISCHARGE SUMMARY    Current functional level related to goals / functional outcomes: Pt achieved a few goals, however he did not fully achieve goals to cognitive deficits, decreased carryover at home and new medical issues.   Remaining deficits: Bradykinesia, cognitive deficits, decreased ROM, decreased balance, decreased coordination   Education / Equipment: Pt was educated regarding:ADL strategies, HEP, ways to prevent future PD complications, community resources memory compensations, Chartered certified accountant. Pt verbalizes understanding of education.     Patient agrees to discharge. Patient goals were partially met. Patient is being discharged due to maximized rehab potential. .      OT End of Session - 06/29/22 1400     Visit Number 14    Number of Visits 24    Date for OT Re-Evaluation 07/15/22    Authorization Type [employer]    Authorization Time Period met OOP max  VL:MN  Follow Medicare Guidelines    Authorization - Visit Number 14    Progress Note Due on Visit 20    OT Start Time 1320    OT Stop Time 1400    OT Time Calculation (min) 40 min    Activity Tolerance Patient tolerated treatment well    Behavior During Therapy WFL for tasks assessed/performed                       Past Medical History:  Diagnosis Date   Arthritis    R shoulder, bone spur   Arthritis    "hands" (03/25/2016)   Chronic diastolic heart failure (HCC)    Chronic lower back pain    Coronary heart disease    Dr Pernell Dupre   H/O cardiovascular stress test    perhaps last one was 2009   Heart murmur    12/29/11 echo: mild MR, no AS, trivial TR   History of gout    Hyperlipidemia    Hypertension    saw Dr. Linard Millers last earl;y- 2014,  cardiac cath. last done ?2009, blocks seen didn't require any intervention at that point.   Narcolepsy    [name] 04-11-97; Mean Latency 1.84mn, SOREM 2   OSA on CPAP    NPSG 12-13-98 AHI 22.7   PONV (postoperative nausea and vomiting)    Presence of permanent cardiac pacemaker    Shortness of breath    with exertion    [place]    Type II diabetes mellitus ([facility] [place]    Past Surgical History:  Procedure Laterality Date   CARDIAC CATHETERIZATION  2009?   EP IMPLANTABLE DEVICE N/A 03/25/2016   Procedure: Pacemaker Implant - Dual Chamber;  Surgeon: GEvans Lance MD;  [facility];  Service: Cardiovascular;  Laterality: N/A;   FRACTURE SURGERY     INSERT / REPLACE / REMOVE PACEMAKER  03/24/2016   INTRAMEDULLARY (IM) NAIL INTERTROCHANTERIC Left 02/08/2022   Procedure: INTRAMEDULLARY (IM) NAIL INTERTROCHANTRIC;  Surgeon: DMeredith Pel MD;  [facility]  Service: Orthopedics;  Laterality: Left;   IR KYPHO LUMBAR INC FX REDUCE BONE BX UNI/BIL CANNULATION INC/IMAGING  12/29/2021   IR RADIOLOGIST EVAL & MGMT  12/15/2021   LEFT AND RIGHT HEART CATHETERIZATION WITH CORONARY ANGIOGRAM N/A 06/05/2014   Procedure: LEFT AND RIGHT HEART CATHETERIZATION WITH CORONARY ANGIOGRAM;  Surgeon: HBelva CromeIII,  MD;  Location: [facility];  Service: Cardiovascular;  Laterality: N/A;   NASAL FRACTURE SURGERY     SHOULDER ARTHROSCOPY WITH ROTATOR CUFF REPAIR AND SUBACROMIAL DECOMPRESSION Right 08/16/2013   Procedure: SHOULDER ARTHROSCOPY WITH ROTATOR CUFF REPAIR AND SUBACROMIAL DECOMPRESSION;  Surgeon: [name], MD;  Location: [facility];  Service: Orthopedics;  Laterality: Right;  Right shoulder arthroscopy rotator cuff repair, subacromial decompression.   TONSILLECTOMY     Patient Active Problem List   Diagnosis Date Noted   Acute deep vein thrombosis (DVT) of left lower extremity ([place]) 04/29/2022   Closed intertrochanteric fracture of left hip, initial encounter [facility])    Closed  left hip fracture (HCC) secondary to fall at home 02/08/2022   Paroxysmal atrial fibrillation ([place]) 02/08/2022   Debility 01/08/2022   Constipation 01/07/2022   Physical deconditioning 85/92/9244   Acute metabolic encephalopathy 62/86/3817   Hypomagnesemia 01/04/2022   Pancreatitis 12/30/2021   Parkinson's disease ([place]) 12/21/2021   Parkinson disease ([place]) 12/18/2021   Frequent falls 12/18/2021   History of CVA (cerebrovascular accident) 12/18/2021   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   Hallucinations, visual 07/30/2021   Acetabulum fracture, left ([place]) 06/25/2021   Acute hip pain, left 06/24/2021   Fall at home, initial encounter 06/24/2021   Leukocytosis 06/24/2021   Chronic diastolic heart failure (HCC)    Sinusitis, acute maxillary 06/24/2020   AKI (acute kidney injury) ([place])    Labile blood glucose    Cough    Benign essential HTN    Skin lesion of hand    Hypoalbuminemia due to protein-calorie malnutrition ([place])    Controlled type 2 diabetes mellitus with hyperglycemia, without long-term current use of insulin (HCC)    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   Morbid obesity ([place])    Acute blood loss anemia    Acute thalamic infarction ([place]) 04/15/2020   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[place]) 05/25/2019   TIA (transient ischemic attack) 02/17/2018   Type 2 diabetes mellitus with hyperlipidemia ([place]) 02/17/2018   Insomnia 02/01/2018   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   Chronotropic incompetence 07/09/2014   Dyspnea on exertion 03/03/2014   HLD (hyperlipidemia) 12/13/2007   Essential hypertension 12/13/2007   Class 1 obesity 12/05/2007   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   Coronary atherosclerosis 12/05/2007    ONSET DATE: 04/19/22  REFERRING DIAG: thalamic CVA I63.81  THERAPY DIAG:  Other lack of coordination  Attention and concentration deficit  Frontal lobe and executive function deficit  Abnormal  posture  Unsteadiness on feet  Muscle weakness (generalized)  Tremor  Other abnormalities of gait and mobility  Rationale for Evaluation and Treatment Rehabilitation  SUBJECTIVE:   SUBJECTIVE STATEMENT: Pt reports he has not been doing his coordination exercises at home  Pt accompanied by:  self, wife   PERTINENT HISTORY: Jordan [name] is an 81 year old male with history of CAD/chronic diastolic CHF, OSA, R1HA, gout, Parkinson's diease with recent hospital admission 02/17-02-22-23 worsening of gait disorder with falls with hallucinations which was treated with medication adjustment and he was discharged to home. He was readmitted on 12/30/21 with abdominal pain with low grade fever, leucocytosis, lactic acidosis, mild confusion and lethargy secondary to sepsis. He was found to have elevated lipase-165  with inflammatory changes suggestive of primary pancreatitis with secondary inflammation of hepatic flexure of colon or primary sigmoid diverticulitis with secondary extension to pancreatic tail. He was treated with IVF and broad spectrum antibiotics.   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 Pt fell and sustained hip fx on 02/08/22 and pt underwent left  IM nailing.  04/30/22:  hospitalized with blood clot LLE--changed meds and cleared to resume OT/PT.  New diagonis abdominal and liver mass, await CT   Pt accompanied by: self and significant other, spouse, [name]    PRECAUTIONS: Fall and Other: , hx of back kyphoplasty  , pacemaker, hx of L hip fx, s/p IM nailing, hx of shoulder dislocations, hx of LLE DVT  WEIGHT BEARING RESTRICTIONS No  PAIN:  Are you having pain? Yes, lower back (pain patch on) 0/10 at rest, fluctuates w/ movement. O.T. addressing through posture, reaching ex's  PLOF: Independent prior to CVA in 2021  PATIENT GOALS to be more independent, pt's wife states he would like to be able to  drive  OBJECTIVE:   TODAY'S TREATMENT:  Discussion with pt/ wife regarding plans for d/c today and cancelling Thursday's appointment as pt is scheduled for a liver biopsy tomorrow.  Pt's wife stated that she was upset because she thought pt should get all of his approved visits.(Pt's wife was tearful) Therapist expressed that pt is not progressing and he has not been carrying over exercises at home.(Pt reports today that he has not been performing his coordination exercises at home and has been inconsistent with other exercises.) Therapist explained that we can not justify continuing therapy as a result. . Therapist checked progress towards goals and discussed plans for screen in 6 mons.    PATIENT EDUCATION: Education details:progress towards goals and 6 month screens, therapist reprinted coordination HEP. Person educated: Patient and Spouse Education method: Explanation and Handouts Education comprehension: verbalized understanding   HOME EXERCISE PROGRAM: Coordination 05/10/22:  cane HEP (shoulder flex, chest press, and shoulder abduction); strategies for cutting food and donning shirt 05/17/22:  strategies for donning sock with sock aide, donning pants with reacher, and writing 06/03/22-ways to prevent future PD complications and community resources-page 1 only, reviewed with patient 06/22/22-memory compensations. 06/24/22:  Keeping Thinking Skills Hervey Ard; verbally reviewed writing strategies; PD exercise chart issued and reviewed use      GOALS: Goals reviewed with patient? No  SHORT TERM GOALS: Target date: 05/14/22--extend to 05/27/22 due to missed visits  I with initial HEP Baseline: Goal status: met, cane exercises   2.  Pt will verbalize understanding of adapted strategies to maximize safety and I with ADLs/ IADLs (LF:YBOFBPZ food) Baseline:  Goal status: MET, education performed regarding cutting, food and dressing,   3.  Pt will demonstrate improved UE functional use  as evidenced by increasing LUE box/ blocks score by 3 blocks. Baseline: RUE 45, LUE 37 Goal status:   not met R-37blocks, L-32 blocks   4.  Pt will perform LB dressing with min A demonstrating good safety awareness. Baseline: mod-max A Goal status: PARTIALLY MET - extra time  5.  Pt will consistently donn pullover shirt mod I in a reasonable amount of time Baseline: increased time required and prn min assist Goal status: MET  6.   Pt will verbalize understanding of ways to prevent future PD related complications and PD community resources. Goal status: MET  7. Pt will retrieve items from overhead shelf at 95* with right UE with -15 elbow extension Baseline: 95, -20 Goal status: PARTIALLY MET.  115* with -20* elbow ext.  8. Pt will retrieve items from overhead shelf at 110 with left UE with -  10 elbow extension Baseline: 110, -15 Goal status: Partially MET 05/27/22:  L-125* -15* elbow ext  LONG TERM GOALS: Target date: 07/15/2022    Pt will demonstrate understanding of memory compensations and ways to keep thinking skills sharp  Goal status: Met, memory compensations issued.  Keeping thinking Skills Hervey Ard Issued 06/24/22  2.  Pt will demonstrate improved fine motor coordination for ADLs as evidenced by decreasing 9 hole peg test score for bilateral UE's by 3 secs Baseline: RUE 42.47, LUE 42.25 Goal status: not met RUE 42.25, LUE 61.81  3.  Pt will demonstrate improved ease with feeding as evidenced by decreasing PPT#2 (self feeding) by 3 secs Baseline: 16.66 secs Goal status: not met 18.78  4.  Pt will demonstrate improved ease with fastening buttons as evidenced by decreasing 3 button/ unbutton time to : 95 secs Baseline: 1 min 45 secs Goal status: not met 2 mins and 2 secs to fasten 1 button  5.  Pt will write a short paragraph with 100% legibility and no significant decrease in letter size Baseline: mild micrographia Goal status:partially met, inconsistent  6.  Pt will  donn/ doff jacket in 2 mins and 30 secs or less in seated. Goal status: deferred   ASSESSMENT:  CLINICAL IMPRESSION: Therapist checked progress towards goals. Pt progress has plateaued and pt has new medical issues for which he has been diagnosed for which he will need testing and potential treatment. Pt is scheduled for a liver biopsy tomorrow.  PERFORMANCE DEFICITS in functional skills including ADLs, IADLs, coordination, dexterity, sensation, ROM, strength, pain, flexibility, FMC, GMC, mobility, balance, endurance, decreased knowledge of precautions, decreased knowledge of use of DME, UE functional use, and bradykinesia, cognitive skills including attention, learn, memory, problem solving, safety awareness, and thought, and psychosocial skills including coping strategies, environmental adaptation, habits, interpersonal interactions, and routines and behaviors.   IMPAIRMENTS are limiting patient from ADLs, IADLs, rest and sleep, play, leisure, and social participation.   COMORBIDITIES may have co-morbidities  that affects occupational performance. Patient will benefit from skilled OT to address above impairments and improve overall function.  MODIFICATION OR ASSISTANCE TO COMPLETE EVALUATION: Min-Moderate modification of tasks or assist with assess necessary to complete an evaluation.  OT OCCUPATIONAL PROFILE AND HISTORY: Detailed assessment: Review of records and additional review of physical, cognitive, psychosocial history related to current functional performance.  CLINICAL DECISION MAKING: Moderate - several treatment options, min-mod task modification necessary  REHAB POTENTIAL: Good  EVALUATION COMPLEXITY: Moderate    PLAN: OT FREQUENCY: 2x/week  OT DURATION: 12 weeks plus eval  PLANNED INTERVENTIONS: self care/ADL training, therapeutic exercise, therapeutic activity, neuromuscular re-education, manual therapy, passive range of motion, balance training, functional mobility  training, aquatic therapy, ultrasound, paraffin, fluidotherapy, moist heat, cryotherapy, contrast bath, patient/family education, cognitive remediation/compensation, visual/perceptual remediation/compensation, energy conservation, coping strategies training, and DME and/or AE instructions  RECOMMENDED OTHER SERVICES: n/a  CONSULTED AND AGREED WITH PLAN OF CARE: Patient and family member/caregiver  PLAN FOR NEXT SESSION d/c OT, PD screens in 6 mons.  Jeorge Reister, OTR/L 06/29/2022, 3:55 PM  [phone] Phone: [phone] 3:55 PM 06/29/22

## 2022-06-30 ENCOUNTER — Telehealth: Payer: Self-pay

## 2022-06-30 ENCOUNTER — Ambulatory Visit (HOSPITAL_COMMUNITY)
Admission: RE | Admit: 2022-06-30 | Discharge: 2022-06-30 | Disposition: A | Discharge status: Home / Self Care | Payer: Medicare (Managed Care) | Source: Ambulatory Visit | Attending: Physician Assistant | Admitting: Physician Assistant

## 2022-06-30 DIAGNOSIS — K769 Liver disease, unspecified: Secondary | ICD-10-CM

## 2022-06-30 DIAGNOSIS — K6389 Other specified diseases of intestine: Secondary | ICD-10-CM

## 2022-06-30 NOTE — Telephone Encounter (Signed)
T/C from pt's wife stating pt did not have his biopsy today because he had eaten cookies.  It has been rescheduled to 9/8 and he will need another rx for Lovenox.

## 2022-06-30 NOTE — Progress Notes (Deleted)
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 Isidore Moos.  Patient presents today in his usual state of health.  There is a small palpable submandibular nodule on the right. He requests no sedation. Team aware.   Brynda Greathouse, MS RD PA-C 8:34 AM

## 2022-07-01 ENCOUNTER — Ambulatory Visit: Payer: Medicare (Managed Care) | Admitting: Speech Pathology

## 2022-07-01 ENCOUNTER — Ambulatory Visit: Payer: Medicare (Managed Care) | Admitting: Occupational Therapy

## 2022-07-01 ENCOUNTER — Ambulatory Visit: Payer: Medicare (Managed Care) | Admitting: Physical Therapy

## 2022-07-01 VITALS — BP 120/60 | HR 67

## 2022-07-01 DIAGNOSIS — R41841 Cognitive communication deficit: Secondary | ICD-10-CM

## 2022-07-01 DIAGNOSIS — R293 Abnormal posture: Secondary | ICD-10-CM | POA: Diagnosis not present

## 2022-07-01 DIAGNOSIS — M6281 Muscle weakness (generalized): Secondary | ICD-10-CM

## 2022-07-01 DIAGNOSIS — R2681 Unsteadiness on feet: Secondary | ICD-10-CM

## 2022-07-01 DIAGNOSIS — R471 Dysarthria and anarthria: Secondary | ICD-10-CM

## 2022-07-01 DIAGNOSIS — R2689 Other abnormalities of gait and mobility: Secondary | ICD-10-CM

## 2022-07-01 MED ORDER — ENOXAPARIN SODIUM 100 MG/ML IJ SOSY
100.0000 mg | PREFILLED_SYRINGE | Freq: Two times a day (BID) | INTRAMUSCULAR | 0 refills | Status: DC
Start: 2022-07-01 — End: 2022-07-12

## 2022-07-01 NOTE — Telephone Encounter (Signed)
Sent refills for Lovenox.   Helen/Karen: Please explain how to bridge Lovenox with the Xarelto.   Hold Xarelto 48 hours before biopsy and start Lovenox injections every 12 hours. Hold Lovenox injections 12 hours before biopsy. Resume Xarelto after biopsy.

## 2022-07-01 NOTE — Therapy (Signed)
OUTPATIENT PHYSICAL THERAPY TREATMENT NOTE   Patient Name: Jordan [name] MRN: [id number] DOB:10/17/1941, 81 y.o., male Today's Date: 07/01/2022  PCP: [name], MD REFERRING PROVIDER: [name], MD   END OF SESSION:   PT End of Session - 07/01/22 1317     Visit Number 13    Number of Visits 17    Date for PT Re-Evaluation 07/21/22    Authorization Type [employer]    PT Start Time 0814   Handoff w/SLP   PT Stop Time 1400    PT Time Calculation (min) 43 min    Equipment Utilized During Treatment Gait belt    Activity Tolerance Patient tolerated treatment well    Behavior During Therapy WFL for tasks assessed/performed                  Past Medical History:  Diagnosis Date   Arthritis    R shoulder, bone spur   Arthritis    "hands" (03/25/2016)   Chronic diastolic heart failure (HCC)    Chronic lower back pain    Coronary heart disease    Dr Pernell Dupre   H/O cardiovascular stress test    perhaps last one was 2009   Heart murmur    12/29/11 echo: mild MR, no AS, trivial TR   History of gout    Hyperlipidemia    Hypertension    saw Dr. Linard Millers last earl;y- 2014, cardiac cath. last done ?2009, blocks seen didn't require any intervention at that point.   Narcolepsy    [name] 04-11-97; Mean Latency 1.27mn, SOREM 2   OSA on CPAP    NPSG 12-13-98 AHI 22.7   PONV (postoperative nausea and vomiting)    Presence of permanent cardiac pacemaker    Shortness of breath    with exertion    Stroke ([place]    Type II diabetes mellitus ([facility]    Past Surgical History:  Procedure Laterality Date   CARDIAC CATHETERIZATION  2009?   EP IMPLANTABLE DEVICE N/A 03/25/2016   Procedure: Pacemaker Implant - Dual Chamber;  Surgeon: GEvans Lance MD;  [facility];  Service: Cardiovascular;  Laterality: N/A;   FRACTURE SURGERY     INSERT / REPLACE / REMOVE PACEMAKER  03/24/2016   INTRAMEDULLARY (IM) NAIL INTERTROCHANTERIC Left 02/08/2022   Procedure:  INTRAMEDULLARY (IM) NAIL INTERTROCHANTRIC;  Surgeon: DMeredith Pel MD;  [facility]  Service: Orthopedics;  Laterality: Left;   IR KYPHO LUMBAR INC FX REDUCE BONE BX UNI/BIL CANNULATION INC/IMAGING  12/29/2021   IR RADIOLOGIST EVAL & MGMT  12/15/2021   LEFT AND RIGHT HEART CATHETERIZATION WITH CORONARY ANGIOGRAM N/A 06/05/2014   Procedure: LEFT AND RIGHT HEART CATHETERIZATION WITH CORONARY ANGIOGRAM;  Surgeon: HSinclair Grooms MD;  [facility];  Service: Cardiovascular;  Laterality: N/A;   NASAL FRACTURE SURGERY     SHOULDER ARTHROSCOPY WITH ROTATOR CUFF REPAIR AND SUBACROMIAL DECOMPRESSION Right 08/16/2013   Procedure: SHOULDER ARTHROSCOPY WITH ROTATOR CUFF REPAIR AND SUBACROMIAL DECOMPRESSION;  Surgeon: JNita Sells MD;  [facility]  Service: Orthopedics;  Laterality: Right;  Right shoulder arthroscopy rotator cuff repair, subacromial decompression.   TONSILLECTOMY     Patient Active Problem List   Diagnosis Date Noted   Acute deep vein thrombosis (DVT) of left lower extremity ([facility] 04/29/2022   Closed intertrochanteric fracture of left hip, initial encounter ([facility]    Closed left hip fracture ([facility] secondary to fall at home 02/08/2022  Paroxysmal atrial fibrillation ([place]) 02/08/2022   Debility 01/08/2022   Constipation 01/07/2022   Physical deconditioning 25/49/8264   Acute metabolic encephalopathy 15/83/0940   Hypomagnesemia 01/04/2022   Pancreatitis 12/30/2021   Parkinson's disease ([place]) 12/21/2021   Parkinson disease ([place]) 12/18/2021   Frequent falls 12/18/2021   History of CVA (cerebrovascular accident) 12/18/2021   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   Hallucinations, visual 07/30/2021   Acetabulum fracture, left ([place]) 06/25/2021   Acute hip pain, left 06/24/2021   Fall at home, initial encounter 06/24/2021   Leukocytosis 06/24/2021   Chronic diastolic heart failure ([place])    Sinusitis, acute maxillary 06/24/2020   AKI (acute kidney injury)  ([place])    Labile blood glucose    Cough    Benign essential HTN    Skin lesion of hand    Hypoalbuminemia due to protein-calorie malnutrition ([place])    Controlled type 2 diabetes mellitus with hyperglycemia, without long-term current use of insulin ([place])    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   Morbid obesity ([place])    Acute blood loss anemia    Acute thalamic infarction ([place]) 04/15/2020   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[place]) 05/25/2019   TIA (transient ischemic attack) 02/17/2018   Type 2 diabetes mellitus with hyperlipidemia ([place]) 02/17/2018   Insomnia 02/01/2018   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   Chronotropic incompetence 07/09/2014   Dyspnea on exertion 03/03/2014   HLD (hyperlipidemia) 12/13/2007   Essential hypertension 12/13/2007   Class 1 obesity 12/05/2007   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   Coronary atherosclerosis 12/05/2007    REFERRING DIAG: I63.81 (ICD-10-CM) - 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[place]) G20 (ICD-10-CM) - Parkinson's disease ([place]) S72.145S (ICD-10-CM) - Type III open nondisplaced intertrochanteric fracture of left femur, sequela    THERAPY DIAG:  Muscle weakness (generalized)  Other abnormalities of gait and mobility  Unsteadiness on feet  Rationale for Evaluation and Treatment Rehabilitation  PERTINENT HISTORY: PMHx: PD (diagnosed Jan 2022), HLD, OSA, DM, HTN, chronic diastolic HF, CVA, frequent falls, L2 compression fx, L hip fx   PRECAUTIONS: Fall and ICD/Pacemaker, history of compression fracture at L2 with recent kyphoplasty   SUBJECTIVE: Pt reports he is doing well, dizziness is reduced. Did not get biopsy yesterday because he ate cookies. Has not tried the [employer] exercises yet due to time constraints. No new falls   VITALS Vitals:   07/01/22 1327  BP: 120/60  Pulse: 67      PAIN:  Are you having pain? No "I always have discomfort in my abdomen but it is nothing to complain about at the moment".    OBJECTIVE  TODAY'S TREATMENT: Therapeutic Activity LTG assessment    [facility] PT Assessment - 07/01/22 1332       Balance   Balance Assessed Yes      Standardized Balance Assessment   Standardized Balance Assessment 10 meter walk test;Timed Up and Go Test    10 Meter Walk 32.8' over 15.47s = 2.12 ft/s with RW      Timed Up and Go Test   Normal TUG (seconds) 38.85   w/RW and CGA             Ther Ex  -Noted from TUG that pt has significant difficulty w/turns, as he tends to step outside of RW and placing himself at high fall risk. Performed quarter turn at counter w/max multimodal cues for proper sequencing and increased step clearance, as pt tends to slide feet on ground to turn and demonstrates very narrow  BOS. Pt reports he is unable to perform quarter turn w/full hip extension "and I do not know why". Pt performed x5 turns to each side prior needing to sit down due to fatigue. Added quarter turn to HEP (see bolded below)    PATIENT EDUCATION: Education details: Importance of performing HEP this weekend, updates to HEP, provided demonstration of proper turn w/RW. Pt's wife inquiring about why PT is deciding to DC, reiterated conversation from previous therapist regarding pt's lack of progress and priority being on recent cancer discovery. Pt's wife reluctantly verbalized understanding.  Person educated: Patient and Spouse Education method: Explanation, Demonstration, Handout Education comprehension: verbalized understanding, needs further instructions     HOME EXERCISE PROGRAM: Access Code: [email or web address] URL: [email or web address] Date: 06/29/2022 Prepared by: Janann August  Exercises - Clamshell  - 1 x daily - 7 x weekly - 3 sets - 10 reps - Seated Scapular Retraction  - 1 x daily - 7 x weekly - 3 sets - 10 reps - Seated Long Arc Quad  - 1 x daily - 7 x weekly - 3 sets - 10 reps - Seated Hamstring Stretch  - 1 x daily - 7 x weekly - 3 sets - 10 reps - Sit to  Stand  - 1 x daily - 7 x weekly - 3 sets - 10 reps - Seated Heel Raise  - 1 x daily - 7 x weekly - 3 sets - 10 reps - Seated March  - 1 x daily - 7 x weekly - 3 sets - 10 reps - Seated Flexion Stretch with Swiss Ball  - 1 x daily - 7 x weekly - 3 sets - 10 reps - Brandt-Daroff Vestibular Exercise  - 1 x daily - 5 x weekly - 3 sets - Standing Quarter Turn with Counter Support  - 1 x daily - 7 x weekly - 3 sets - 10 reps     GOALS: Goals reviewed with patient? Yes   SHORT TERM GOALS: Target date: 05/19/2022   Pt will be able to perform initial HEP for strengthening and balance to continue gains with supervision of wife. Baseline: Goal status: MET   2.  BERG to be assessed with STG/LTG written.  Baseline: 20/56 Goal status: MET   3.  Pt will improve 5x sit<>stand to less than or equal to 19 sec without UE support to demonstrate improved functional strength and transfer efficiency.   Baseline: 22.6 seconds without UE support from chair; 21seconds without UE support  Goal status: NOT MET   4.  Pt will ambulate >350' with RW mod I for improved household and short community distances. Baseline: requires CGA/MinA/ limited to 230' Goal status: NOT MET   5.  Pt will improve gait speed with RW to at least 1.3 ft/sec in order to demo improved community mobility.    Baseline:  .87 ft/sec with RW; .13f/s (0.364m) Goal status: NOT MET       LONG TERM GOALS: Target date: 07/01/2022      Pt will be independent with final HEP for strength,gait, balance in order to build upon functional gains made in therapy.   Baseline:  Goal status: IN PROGRESS    2.  Patient will score >25/56 on Berg to demonstrate improved balance Baseline: 20/56; 30/56 on 8/22 Goal status: MET   3.  Pt will improve TUG time with RW vs. LRAD to 26 seconds or less in order to demo decrease fall risk.   Baseline: 33.32 seconds with RW;  30.22 seconds on 06/14/22; 38.85s on 8/31 w/RW  Goal status: NOT MET   4.  Pt  will improve gait speed with RW vs. LRAD to at least 1.9 ft/sec in order to demo improved community mobility.  Baseline: .87 ft/sec with RW; 29.87 seconds = 1.09 ft/sec on 06/14/22; 15.47s = 2.12 ft/s on 8/31 w/RW  Goal status: MET    5.  Pt will ambulate >350' with RW versus LRAD on paved surfaces with supervision for improved community mobility. Baseline: 10' with RW on indoor surfaces on 06/14/22 Goal status: ONGOING   NEW LONG TERM GOALS: Target date: 07/21/2022 (updated to reflect length of POC)  1. Pt will ambulate >350' with RW versus LRAD on paved surfaces with supervision for improved community mobility.  Baseline: 3' with RW on indoor surfaces on 06/14/22 Goal status: ONGOING  2.  Pt will improve TUG time with RW vs. LRAD to 26 seconds or less in order to demo decrease fall risk. Baseline: 33.32 seconds with RW; 30.22 seconds on 06/14/22; 38.85s on 8/31 w/RW  Goal status: ONGOING  3.  Pt will be independent with final HEP for strength,gait, balance in order to build upon functional gains made in therapy. Baseline:  Goal status: IN PROGRESS    ASSESSMENT:   CLINICAL IMPRESSION: Emphasis of skilled PT session on LTG assessment and practicing proper turns w/RW. Pt has met 2/5 LTGs, improving his gait velocity to 2.12 ft/s w/RW, up from 0.99 ft/s on eval. Pt has also improved his Berg assessment, indicative of reduced fall risk. Pt continues to regress his speed on Tug due to difficulty w/turns, as pt tends to step outside of RW and put extra strain on low back. Added proper turn technique to HEP for pt to practice at home and will reassess in 2 weeks to follow updated POC. Continue POC.     OBJECTIVE IMPAIRMENTS Abnormal gait, decreased activity tolerance, decreased balance, decreased coordination, decreased endurance, decreased knowledge of use of DME, decreased mobility, decreased ROM, decreased strength, impaired flexibility, postural dysfunction, and pain.    ACTIVITY  LIMITATIONS cleaning, community activity, driving, meal prep, laundry, yard work, and shopping.    PERSONAL FACTORS Age, Past/current experiences, Time since onset of injury/illness/exacerbation, and 3+ comorbidities:  PD, HLD, OSA, DM, HTN, chronic diastolic HF, CVA, frequent falls, L2 compression fx, L hip fx   are also affecting patient's functional outcome.      REHAB POTENTIAL: Good   CLINICAL DECISION MAKING: Evolving/moderate complexity   EVALUATION COMPLEXITY: Moderate   PLAN: PT FREQUENCY: 2x/week   PT DURATION: 12 weeks, plus eval   PLANNED INTERVENTIONS: Therapeutic exercises, Therapeutic activity, Neuromuscular re-education, Balance training, Gait training, Patient/Family education, Joint mobilization, Stair training, Vestibular training, Canalith repositioning, DME instructions, and Manual therapy   PLAN FOR NEXT SESSION: Walk in with pt from waiting room. Review current HEP and update as needed. And do any other exercise. Will just use remainder of current visits to try to clear BPPV. Did they try Nestor Lewandowsky exercises from home?   Cruzita Lederer Veria Stradley, PT, DPT 07/01/2022, 2:12 PM

## 2022-07-01 NOTE — Addendum Note (Signed)
Addended by: Dede Query T on: 07/01/2022 10:40 AM   Modules accepted: Orders

## 2022-07-01 NOTE — Progress Notes (Signed)
I spoke with Ms Nicole Kindred regarding xeralto and lovenox instructions for Mr Quaintance's upcoming liver biopsy.  Stop Alen Blew 9/6 with am dose and resume after biopsy.  Start lovenox 9/6 amd twice daily until 9/7 pm.  All questions were answered.  She verbalized understanding.

## 2022-07-01 NOTE — Therapy (Signed)
OUTPATIENT SPEECH LANGUAGE PATHOLOGY TREATMENT NOTE   Patient Name: Jordan [name] MRN: [id number] DOB:July 21, 1941, 81 y.o., male Today's Date: 07/01/2022  PCP: [name] MD REFERRING PROVIDER: Izora Ribas, MD   END OF SESSION:   End of Session - 07/01/22 1237     Visit Number 16    Number of Visits 25    Date for SLP Re-Evaluation 07/16/22    Authorization Type [employer]  $20 co pay per day    SLP Start Time 1237   pt arrived late   SLP Stop Time  1315    SLP Time Calculation (min) 38 min    Activity Tolerance Patient tolerated treatment well                  Past Medical History:  Diagnosis Date   Arthritis    R shoulder, bone spur   Arthritis    "hands" (03/25/2016)   Chronic diastolic heart failure (HCC)    Chronic lower back pain    Coronary heart disease    Dr Pernell Dupre   H/O cardiovascular stress test    perhaps last one was 2009   Heart murmur    12/29/11 echo: mild MR, no AS, trivial TR   History of gout    Hyperlipidemia    Hypertension    saw Dr. Linard Millers last earl;y- 2014, cardiac cath. last done ?2009, blocks seen didn't require any intervention at that point.   Narcolepsy    [name] 04-11-97; Mean Latency 1.47mn, SOREM 2   OSA on CPAP    NPSG 12-13-98 AHI 22.7   PONV (postoperative nausea and vomiting)    Presence of permanent cardiac pacemaker    Shortness of breath    with exertion    St[place] ([place]    Type II diabetes mellitus ([place]    Past Surgical History:  Procedure Laterality Date   CARDIAC CATHETERIZATION  2009?   EP IMPLANTABLE DEVICE N/A 03/25/2016   Procedure: Pacemaker Implant - Dual Chamber;  Surgeon: GEvans Lance MD;  [facility];  Service: Cardiovascular;  Laterality: N/A;   FRACTURE SURGERY     INSERT / REPLACE / REMOVE PACEMAKER  03/24/2016   INTRAMEDULLARY (IM) NAIL INTERTROCHANTERIC Left 02/08/2022   Procedure: INTRAMEDULLARY (IM) NAIL INTERTROCHANTRIC;  Surgeon: DMeredith Pel MD;   [place]  Service: Orthopedics;  Laterality: Left;   IR KYPHO LUMBAR INC FX REDUCE BONE BX UNI/BIL CANNULATION INC/IMAGING  12/29/2021   IR RADIOLOGIST EVAL & MGMT  12/15/2021   LEFT AND RIGHT HEART CATHETERIZATION WITH CORONARY ANGIOGRAM N/A 06/05/2014   Procedure: LEFT AND RIGHT HEART CATHETERIZATION WITH CORONARY ANGIOGRAM;  Surgeon: HSinclair Grooms MD;  [facility];  Service: Cardiovascular;  Laterality: N/A;   NASAL FRACTURE SURGERY     SHOULDER ARTHROSCOPY WITH ROTATOR CUFF REPAIR AND SUBACROMIAL DECOMPRESSION Right 08/16/2013   Procedure: SHOULDER ARTHROSCOPY WITH ROTATOR CUFF REPAIR AND SUBACROMIAL DECOMPRESSION;  Surgeon: JNita Sells MD;  [place]  Service: Orthopedics;  Laterality: Right;  Right shoulder arthroscopy rotator cuff repair, subacromial decompression.   TONSILLECTOMY     Patient Active Problem List   Diagnosis Date Noted   Acute deep vein thrombosis (DVT) of left lower extremity ([place] 04/29/2022   Closed intertrochanteric fracture of left hip, initial encounter ([place]    Closed left hip fracture ([place] secondary to fall at home 02/08/2022   Paroxysmal atrial fibrillation ([place] 02/08/2022   Debility 01/08/2022  Constipation 01/07/2022   Physical deconditioning 47/42/5956   Acute metabolic encephalopathy 38/75/6433   Hypomagnesemia 01/04/2022   Pancreatitis 12/30/2021   Parkinson's disease ([place]) 12/21/2021   Parkinson disease ([place]) 12/18/2021   Frequent falls 12/18/2021   History of CVA (cerebrovascular accident) 12/18/2021   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   Hallucinations, visual 07/30/2021   Acetabulum fracture, left ([place]) 06/25/2021   Acute hip pain, left 06/24/2021   Fall at home, initial encounter 06/24/2021   Leukocytosis 06/24/2021   Chronic diastolic heart failure ([place])    Sinusitis, acute maxillary 06/24/2020   AKI (acute kidney injury) ([place])    Labile blood glucose    Cough    Benign essential HTN    Skin  lesion of hand    Hypoalbuminemia due to protein-calorie malnutrition ([place])    Controlled type 2 diabetes mellitus with hyperglycemia, without long-term current use of insulin (HCC)    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   Morbid obesity ([place])    Acute blood loss anemia    Acute thalamic infarction ([place]) 04/15/2020   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[place]) 05/25/2019   TIA (transient ischemic attack) 02/17/2018   Type 2 diabetes mellitus with hyperlipidemia ([place]) 02/17/2018   Insomnia 02/01/2018   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   Chronotropic incompetence 07/09/2014   Dyspnea on exertion 03/03/2014   HLD (hyperlipidemia) 12/13/2007   Essential hypertension 12/13/2007   Class 1 obesity 12/05/2007   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 No  SPEECH THERAPY DISCHARGE SUMMARY  Visits from Start of Care: 16  Current functional level related to goals / functional outcomes: Mild improvement in ability to improve vocal intensity with max-A.    Remaining deficits: Hypokinetic dysarthria, cognitive communication impairment   Education / Equipment: Speak Out!; dysarthria strategies/compensations   Patient agrees to discharge. Patient goals were partially met. Patient is being discharged due to maximized rehab potential.   OBJECTIVE:    TODAY'S TREATMENT:  07-01-22: Pt provided extensive education on how patient can optimize his vocal intensity. Pt with numerous questions with goal to  understand why he should use intention to improve clarity of speech. Benefits from verbal-A to use "radio voice" to improve intensity. PT noticing improvement in voice when he "thinks before speaking." Provided written handout to aid carryover of strategies at home. Pt and spouse verbalize understanding. Though ST goals not met, pt has maximized his rehab potential at this time. Pt w/o home practice completion and perseverating on outside factors vs. Using intentional speech.   06-29-22: Pt reports limited carryover of HEP, is perseverating on details of Speak Out lessons, such as how stories are written. Is not practicing the exercises. Led pt through structured conversation in which pt demonstrated little carryover of intentional speech, in spite of usual max-A verbal cues, visual cues and modeling. SLP provided brief education on role of dopamine in speech production and purpose of using intent to aid in improved clarity when communicating. Pt verbalized understanding, following questions which SLP answered to pt's satisfaction. Provided handout.   06-24-22: SLP provides education on purpose  of current speech program being meeting pt's personal goal of communication effectiveness with communication partners. Pt verbalizes agreement and appreciation for education. SLP leads pt through speak out lesson 16. Usual model provided prior to execution and max-A for accurate completion of exercises. Averages this date as follows: sustained vowel 81 dB; counting 77 dB; reading paragraphs 75 dB; cognitive exercise 73 dB. Averages 70 in short conversation following structured practice with usual verbal cues. SLP provided education and coaching for complete completion of HEP.   06-10-22: Pt entered with suboptimal conversational volume (mid 60s dB), in which pt able to correct to low 70s with occasional to usual non-verbal prompting. Pt reported increased distraction today due to abdominal pain but wanted to continue ST.  Focused session on caregiver education and conversational speech to aid patient engagement. Wife endorsed pt exhibited difficulty attending to Speak Out! Lessons at home due to distraction from television and some push-back when wife attempted to provide feedback/cues. Pt identified need to turn off television while completing exercises for optimal attention and maximal benefit with min prompting. SLP suggested completing online Speak Out! Sessions as pt consistently needs modeling and cues to complete exercises accurately. Both agreed to trial online practices. Pt endorsed challenge of reduced breath support during structured practice, in which SLP educated and instructed increasing frequency of breath during practice. Pt told joke over 4-5 minutes with average of 71 dB with intermittent non-verbal cues to maintain targeted conversational volume. Today, pt cleared throat x9 during session with inconsistent awareness. SLP re-educated and cued throat clear alternatives. Recommended increasing hydration to thin mucous as well.   PATIENT EDUCATION: Education details: see above Person educated: Patient and Spouse Education method: Explanation, Demonstration, and Handouts Education comprehension: verbalized understanding, returned demonstration, and needs further education     HOME EXERCISE PROGRAM: Speak Out daily     GOALS: Goals reviewed with patient? Yes   SHORT TERM GOALS: Target date: 05/19/2022     Pt will complete Speak Out! HEP at least 1x/day given occasional min A over 2 sessions  Baseline: 05-17-22 Goal status: Met   2.  Pt will achieve targeted dB (85-90 dB) on warm up exercises with 80% accuracy given occasional min A over 2 sessions  Baseline:  Goal status: Partially Met   3.  Pt will achieve targeted dB (75-85 dB) on reading exercises with 80% accuracy given occasional min A over 2 sessions Baseline: 05-10-22, 05-17-22 Goal status: Met   4.  Pt will achieve targeted dB (72-78 dB)  on cognitive exercises with 80% accuracy given occasional min A over 2 sessions Baseline: 05-10-22, 7-11-07-21 Goal status: Met   5.  Pt will utilize dysarthria compensations in 5-10 minute conversation to optimize vocal intensity and clarity given occasional mod A over 2 sessions  Baseline:  Goal status: Partially Met   6.  Pt will implement memory/attention compensations to aid daily functioning given occasional mod A over 2 sessions  Baseline:  Goal status: Partially Met   LONG TERM GOALS: Target date: 07/16/2022    Pt will complete Speak Out! HEP at least 1x/day (BID recommended) > 1 week  Baseline: 07/01/2022 Goal status: not met   2.  Pt will achieve targeted dB levels in demonstration of Speak Out! Lessons with 90% accuracy given rare min A over 2 sessions  Baseline: 07/01/2022 Goal status: not met   3.  Pt will utilize dysarthria compensations in 15+ minute conversation to optimize vocal intensity and clarity given occasional min A over  2 sessions Baseline: 07/01/2022 Goal status: not met   4.  Pt will implement memory/attention compensations to aid daily functioning given occasional min A over 2 sessions  Baseline: 07/01/2022 Goal status: not met   5.  Pt will report improved communication effectiveness via PROM by 2 point improvement by last ST session  Baseline: CPIB=26 Goal status: not met   ASSESSMENT:   CLINICAL IMPRESSION: Patient is a 81 y.o. male who was seen today for previous thalamic stroke and Parkinson's disease. 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 Conducted ongoing education and training with patient and wife on Speak Out! Program to optimize vocal intensity and clarity. Usual modeling and cues required to aid patient comprehension and implementation of targeted techniques to maximize speech intelligibility. D/c this date d/t pt maximizing rehab potential. Will return  for screen in 6 months.    OBJECTIVE IMPAIRMENTS  Objective impairments include attention, memory, and dysarthria. These impairments are limiting patient from household responsibilities and effectively communicating at home and in community.Factors affecting potential to achieve goals and functional outcome are medical prognosis and previous level of function. Patient will benefit from skilled SLP services to address above impairments and improve overall function.   REHAB POTENTIAL: Good   PLAN: SLP FREQUENCY: 2x/week   SLP DURATION: 12 weeks   PLANNED INTERVENTIONS: Cueing hierachy, Cognitive reorganization, Internal/external aids, Functional tasks, Multimodal communication approach, SLP instruction and feedback, Compensatory strategies, and Patient/family education  Su Monks, CCC-SLP 07/01/2022, 3:18 PM

## 2022-07-01 NOTE — Patient Instructions (Addendum)
Your goal going forward:  I will self-correct my speech.   I will repeat myself with intent when I realize I wasn't loud enough.   I will stop relying on Kelly to tell me when I'm not loud enough.  Channel your inner [place]. Turn it on, use your radio voice.   Other Strategies:  Modify your environment. Make the environment quiet by turning off the TV and radio. Make sure the listener can see your face.  Limit distractions.   Think about what you want to say before you say it  Slow down, be intentional   You will need to practice being intentional. Practice daily. You want to practice turning your radio voice on at home before you need to use it in real life.

## 2022-07-02 NOTE — Progress Notes (Signed)
F/u appt made with Dr Burr Medico to review biopsy results.  Message left for Mrs Boody with this information.

## 2022-07-07 ENCOUNTER — Other Ambulatory Visit: Payer: Self-pay | Admitting: Radiology

## 2022-07-07 ENCOUNTER — Ambulatory Visit: Payer: Medicare (Managed Care) | Admitting: Neurology

## 2022-07-08 ENCOUNTER — Other Ambulatory Visit: Payer: Self-pay | Admitting: Internal Medicine

## 2022-07-08 ENCOUNTER — Ambulatory Visit: Payer: Medicare (Managed Care) | Admitting: Physical Therapy

## 2022-07-08 NOTE — H&P (Signed)
Chief Complaint: Liver mass  Referring Physician(s): Dede Query, PA-C  Supervising Physician: Mir, Sharen Heck  Patient Status: [facility] - Out-pt  History of Present Illness: Jordan [name] is a 81 y.o. male with medical issues including stroke, parkinsonism, hypertension, hyperlipidemia, diabetes, coronary heart diease, DVT on Xarelto, and diastolic heart failure.   He was seen by his PCP due to left sided abdominal pain for several weeks.   CT scan abdomen and pelvis on 06/17/2022. 1. 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 2. Hepatic lesion as discussed above in the posterior RIGHT hemiliver is suspicious for metastasis in this context.   MRI done 06/23/22 showed= 3.9 cm heterogeneous enhancing mass in segment 7 of the right hepatic lobe, which is nonspecific. Hepatic metastasis cannot be excluded.  We are asked to perform an image guided biopsy.  He is NPO. He reports mobility issues due to his parkinson's. He denies weight loss, n/v, SOB, No Fever/chills. ROS negative.    Past Medical History:  Diagnosis Date   Arthritis    R shoulder, bone spur   Arthritis    "hands" (03/25/2016)   Chronic diastolic heart failure (HCC)    Chronic lower back pain    Coronary heart disease    Dr Pernell Dupre   H/O cardiovascular stress test    perhaps last one was 2009   Heart murmur    12/29/11 echo: mild MR, no AS, trivial TR   History of gout    Hyperlipidemia    Hypertension    saw Dr. Linard Millers last earl;y- 2014, cardiac cath. last done ?2009, blocks seen didn't require any intervention at that point.   Narcolepsy    [name] 04-11-97; Mean Latency 1.34mn, SOREM 2   OSA on CPAP    NPSG 12-13-98 AHI 22.7   PONV (postoperative nausea and vomiting)    Presence of permanent cardiac pacemaker    Shortness of breath    with exertion    [place]    Type II diabetes  mellitus [place]     Past Surgical History:  Procedure Laterality Date   CARDIAC CATHETERIZATION  2009?   EP IMPLANTABLE DEVICE N/A 03/25/2016   Procedure: Pacemaker Implant - Dual Chamber;  Surgeon: GEvans Lance MD;  [facility];  Service: Cardiovascular;  Laterality: N/A;   FRACTURE SURGERY     INSERT / REPLACE / REMOVE PACEMAKER  03/24/2016   INTRAMEDULLARY (IM) NAIL INTERTROCHANTERIC Left 02/08/2022   Procedure: INTRAMEDULLARY (IM) NAIL INTERTROCHANTRIC;  Surgeon: DMeredith Pel MD;  [place] [facility]  Service: Orthopedics;  Laterality: Left;   IR KYPHO LUMBAR INC FX REDUCE BONE BX UNI/BIL CANNULATION INC/IMAGING  12/29/2021   IR RADIOLOGIST EVAL & MGMT  12/15/2021   LEFT AND RIGHT HEART CATHETERIZATION WITH CORONARY ANGIOGRAM N/A 06/05/2014   Procedure: LEFT AND RIGHT HEART CATHETERIZATION WITH CORONARY ANGIOGRAM;  Surgeon: HSinclair Grooms MD;  [facility];  Service: Cardiovascular;  Laterality: N/A;   NASAL FRACTURE SURGERY     SHOULDER ARTHROSCOPY WITH ROTATOR CUFF REPAIR AND SUBACROMIAL DECOMPRESSION Right 08/16/2013   Procedure: SHOULDER ARTHROSCOPY WITH ROTATOR CUFF REPAIR AND SUBACROMIAL DECOMPRESSION;  Surgeon: JNita Sells MD;  [place] [facility]  Service: Orthopedics;  Laterality: Right;  Right shoulder arthroscopy rotator cuff repair, subacromial decompression.   TONSILLECTOMY      Allergies: Clonazepam and Zoloft [sertraline]  Medications: Prior to  Admission medications   Medication Sig Start Date End Date Taking? Authorizing Provider  acetaminophen (TYLENOL) 500 MG tablet Take 500-1,000 mg by mouth every 6 (six) hours as needed for moderate pain or mild pain.    [provider]  amantadine (SYMMETREL) 100 MG capsule Take 2 capsules (200 mg total) by mouth daily. 05/28/22   Raulkar, Clide Deutscher, MD  atorvastatin (LIPITOR) 40 MG tablet Take 0.5 tablets (20 mg total) by mouth daily. 07/30/21   Medina-Vargas, Monina C, NP   bifidobacterium infantis (ALIGN) capsule Take 1 capsule by mouth daily.    [provider]  Camphor-Menthol-Methyl Sal (HM SALONPAS PAIN RELIEF EX) Apply 1 patch topically daily as needed (Back pain).    [provider]  carbidopa-levodopa (SINEMET IR) 25-100 MG tablet Take 1 tablet by mouth 3 (three) times daily.    [provider]  Carbidopa-Levodopa ER (SINEMET CR) 25-100 MG tablet controlled release TAKE 1 TABLET BY MOUTH EVERYDAY AT BEDTIME Patient taking differently: Take 1 tablet by mouth at bedtime. 03/10/22   Frann Rider, NP  cholecalciferol (VITAMIN D3) 25 MCG (1000 UNIT) tablet Take 1,000 Units by mouth daily.    [provider]  clotrimazole-betamethasone (LOTRISONE) cream Apply 1 Application topically daily as needed (Rash). 06/21/22   [provider]  docusate sodium (COLACE) 100 MG capsule Take 1 capsule (100 mg total) by mouth 2 (two) times daily. Patient taking differently: Take 100 mg by mouth daily as needed for mild constipation or moderate constipation. 02/17/22   Hosie Poisson, MD  enoxaparin (LOVENOX) 100 MG/ML injection Inject 1 mL (100 mg total) into the skin every 12 (twelve) hours. 07/01/22   Lincoln Brigham, PA-C  Ensure Plus (ENSURE PLUS) LIQD Take 237 mLs by mouth every other day. Diabetic Alter with Glucerna    [provider]  feeding supplement, GLUCERNA SHAKE, (GLUCERNA SHAKE) LIQD Take 237 mLs by mouth 2 (two) times daily between meals. Alternate with ensure Diabetic    [provider]  furosemide (LASIX) 20 MG tablet USE AS NEEDED FOR UP TO 5 DAYS IF SWELLING IN LEGS RECURS. CONTACT CARDIOLOGY IS SWELLING DOES NOT IMPROVE OR GETS WORSE. 01/26/22   Raulkar, Clide Deutscher, MD  lidocaine (LIDODERM) 5 % Place 1 patch onto the skin daily as needed. Purchase over the counter. On for 12 hours and off for 12 hours 01/18/22   Love, Ivan Anchors, PA-C  magnesium gluconate (MAGONATE) 500 MG tablet Take 0.5 tablets (250 mg  total) by mouth at bedtime. 03/24/22   Raulkar, Clide Deutscher, MD  Melatonin 10 MG TABS Take 10 mg by mouth at bedtime.    [provider]  metFORMIN (GLUCOPHAGE) 1000 MG tablet Take 1 tablet (1,000 mg total) by mouth daily with breakfast. Patient taking differently: Take 500 mg by mouth 2 (two) times daily with a meal. 01/18/22   Love, Ivan Anchors, PA-C  metoprolol tartrate (LOPRESSOR) 25 MG tablet Take 1 tablet (25 mg total) by mouth 2 (two) times daily. 07/30/21   Medina-Vargas, Monina C, NP  Multiple Vitamin (MULTIVITAMIN WITH MINERALS) TABS tablet Take 1 tablet by mouth daily. 02/19/18   Georgette Shell, MD  NITROSTAT 0.4 MG SL tablet PLACE 1 TABLET (0.4 MG TOTAL) UNDER THE TONGUE EVERY 5 (FIVE) MINUTES AS NEEDED FOR CHEST PAIN. Patient taking differently: Place 0.4 mg under the tongue every 5 (five) minutes as needed for chest pain. 03/31/16   Belva Crome, MD  Omega-3 Fatty Acids (OMEGA 3 PO) Take 1,280  mg by mouth daily.    [provider]  polyethylene glycol (MIRALAX / GLYCOLAX) 17 g packet Take 17 g by mouth daily. 01/09/22   Antonieta Pert, MD  polyvinyl alcohol (LIQUIFILM TEARS) 1.4 % ophthalmic solution Place 1 drop into both eyes as needed for dry eyes. Patient taking differently: Place 1 drop into both eyes daily as needed for dry eyes. 01/18/22   Love, Ivan Anchors, PA-C  QUEtiapine (SEROQUEL) 25 MG tablet TAKE 1/2 TABLETS (12.5 MG TOTAL) BY MOUTH AT BEDTIME. 03/01/22   Raulkar, Clide Deutscher, MD  Vitamin D, Ergocalciferol, (DRISDOL) 1.25 MG (50000 UNIT) CAPS capsule TAKE 1 CAPSULE (50,000 UNITS TOTAL) BY MOUTH EVERY 7 (SEVEN) DAYS 05/06/22   Raulkar, Clide Deutscher, MD  Wheat Dextrin (BENEFIBER PO) Take 10 mLs by mouth daily.    [provider]  XARELTO 20 MG TABS tablet Take 20 mg by mouth daily. With food 06/22/22   [provider]     Family History  Problem Relation Age of Onset   Sleep apnea Sister    Colon cancer Sister     Social History   Socioeconomic  History   Marital status: Married    Spouse name: [name]   Number of children: Not on file   Years of education: Not on file   Highest education level: Not on file  Occupational History   Occupation: [employer]   Occupation: [place] [employer]  Tobacco Use   Smoking status: Former    Packs/day: 2.50    Years: 6.00    Total pack years: 15.00    Types: Cigarettes    Quit date: 06/05/1967    Years since quitting: 55.1   Smokeless tobacco: Never  Vaping Use   Vaping Use: Never used  Substance and Sexual Activity   Alcohol use: Yes    Comment: rare   Drug use: No   Sexual activity: Not Currently  Other Topics Concern   Not on file  Social History Narrative   Lives with wife and son   Right handed   Drinks 1 cups caffeine daily   Social Determinants of Health   Financial Resource Strain: Not on file  Food Insecurity: Not on file  Transportation Needs: Not on file  Physical Activity: Not on file  Stress: Not on file  Social Connections: Not on file     Review of Systems: A 12 point ROS discussed and pertinent positives are indicated in the HPI above.  All other systems are negative.  Review of Systems  Vital Signs: BP (!) 142/73   Pulse 73   Temp 98.7 F (37.1 C) (Tympanic)   Resp 20   Ht '5\' 9"'$  (1.753 m)   Wt 215 lb (97.5 kg)   SpO2 92%   BMI 31.75 kg/m   Physical Exam Vitals reviewed.  Constitutional:      Appearance: Normal appearance.  HENT:     Head: Normocephalic and atraumatic.  Eyes:     Extraocular Movements: Extraocular movements intact.  Cardiovascular:     Rate and Rhythm: Normal rate and regular rhythm.  Pulmonary:     Effort: Pulmonary effort is normal. No respiratory distress.     Breath sounds: Normal breath sounds.  Abdominal:     Palpations: Abdomen is soft.  Musculoskeletal:        General: Normal range of motion.     Cervical back: Normal range of motion.  Skin:    General: Skin is warm and dry.  Neurological:  General: No focal deficit present.     Mental Status: He is alert and oriented to person, place, and time.  Psychiatric:        Mood and Affect: Mood normal.        Behavior: Behavior normal.        Thought Content: Thought content normal.        Judgment: Judgment normal.     Imaging: MR ABDOMEN W WO CONTRAST  Result Date: 06/23/2022 CLINICAL DATA:  Indeterminate liver lesion on recent CT. Mesenteric mass. EXAM: MRI ABDOMEN WITHOUT AND WITH CONTRAST TECHNIQUE: Multiplanar multisequence MR imaging of the abdomen was performed both before and after the administration of intravenous contrast. CONTRAST:  38m GADAVIST GADOBUTROL 1 MMOL/ML IV SOLN COMPARISON:  CT on 06/17/2022 FINDINGS: Lower chest: No acute findings. Hepatobiliary: A 1.8 cm benign hemangioma is seen in the posterior right hepatic lobe, and several tiny sub-cm benign-appearing cysts are also seen. A heterogeneous enhancing mass is seen in segment 7 of the right hepatic lobe which measures 3.9 x 2.7 cm on image 37/18, and has nonspecific characteristics. This could represent a hepatic metastasis. No other liver lesions identified. Gallbladder is unremarkable. No evidence of biliary ductal dilatation. Pancreas: A 2.1 x 1.7 cm cystic lesion in the pancreatic neck remains stable. Other tiny sub-cm cystic foci are also seen in the pancreatic head and body. Differential diagnosis includes pancreatic pseudocysts and indolent IPMNs. No evidence of main pancreatic ductal dilatation or peripancreatic edema. Spleen:  Within normal limits in size and appearance. Adrenals/Urinary Tract: No masses identified. Several benign-appearing renal cysts are again seen bilaterally (no followup imaging recommended). A calculus is seen in the left renal pelvis measuring 7 mm, with mild left pelvicaliectasis. No evidence of hydronephrosis. Stomach/Bowel: Unremarkable. Vascular/Lymphatic: No pathologically enlarged lymph nodes identified. No acute vascular findings.  Other: Mass or lymphadenopathy in the right lower quadrant mesentery is again seen measuring approximately 2.3 cm, without significant change since prior study. Musculoskeletal:  No suspicious bone lesions identified. IMPRESSION: 3.9 cm heterogeneous enhancing mass in segment 7 of the right hepatic lobe, which is nonspecific. Hepatic metastasis cannot be excluded. Small benign hemangioma and cysts in the posterior right hepatic lobe. Small right lower quadrant mesenteric mass or lymphadenopathy, without significant change. Stable 2.1 cm cystic lesion in the pancreatic neck, with other tiny sub-cm cystic foci in the pancreatic head and body. Differential diagnosis includes pancreatic pseudocysts and indolent IPMNs. Recommend continued follow-up by MRI in 6 months. This recommendation follows ACR consensus guidelines: Management of Incidental Pancreatic Cysts: A White Paper of the ACR Incidental Findings Committee. J Am Coll Radiol 29937;16:967-893 7 mm calculus in the left renal pelvis, with mild left pelvicaliectasis. Electronically Signed   By: JMarlaine HindM.D.   On: 06/23/2022 14:45   CT ABDOMEN PELVIS W CONTRAST  Result Date: 06/17/2022 CLINICAL DATA:  Lower abdominal pain for 2-3 weeks in a male age 81 EXAM: CT ABDOMEN AND PELVIS WITH CONTRAST TECHNIQUE: Multidetector CT imaging of the abdomen and pelvis was performed using the standard protocol following bolus administration of intravenous contrast. RADIATION DOSE REDUCTION: This exam was performed according to the departmental dose-optimization program which includes automated exposure control, adjustment of the mA and/or kV according to patient size and/or use of iterative reconstruction technique. CONTRAST:  856mISOVUE-300 IOPAMIDOL (ISOVUE-300) INJECTION 61% COMPARISON:  December 30, 2021 FINDINGS: Lower chest: Pacer leads in the RIGHT heart. Signs of calcified coronary artery disease. Aortic atherosclerosis. No pericardial effusion or nodularity.  Basilar  atelectasis and scarring. Visible airways are patent. Hepatobiliary: Hepatic steatosis. There are areas of low attenuation in the liver which do not appear changed compared to previous imaging. Largest area in the posterior RIGHT hepatic lobe hepatic subsegment VII measuring approximately 4 cm greatest axial dimension 3.3 cm short axis is similar with respect to long axis and may have increased with respect to short axis where it measured approximately 2.5 cm greatest short axis dimension on previous imaging. There is a central area of calcification which was present on remote priors. The lesion surrounding the calcification was not present on more remote priors before December of 2022. A small low-density lesion in the inferior RIGHT hemiliver likely a cyst unchanged since 2015 (image 28/2) lobular hepatic contours. No pericholecystic stranding or signs of biliary duct dilation. Pancreas: Subtle low attenuation in the pancreas is diminished in size (image 24/2) expansion of the pancreatic tail up to 3 cm now 1.8 cm and without surrounding stranding. Small cystic area or area of ductal dilation in the pancreatic head is unchanged approximately 8 x 17 mm (image 33/2) Spleen: Normal. Adrenals/Urinary Tract: Adrenal glands are normal. Stable 12-13 mm LEFT renal pelvic calculus with stranding about the LEFT renal pelvis. This does not obstruct and there is no hydronephrosis on the LEFT. Benign-appearing renal cysts are present in the bilateral kidneys for which no additional dedicated follow-up imaging is recommended largest on the LEFT 2.4 cm greatest axial dimension, largest on the RIGHT 3.2 cm greatest axial dimension. Again no signs of obstruction and no calculi beyond the LEFT UPJ in the LEFT ureter. Stomach/Bowel: No acute gastrointestinal process. Enhancing partially calcified mass in the small bowel mesentery of the RIGHT mid abdomen measuring 2.1 x 2.8 cm present as far back undo that stable since  previous imaging. Adjacent small lymph node showing mild increased enhancement 6 mm. 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 Vascular/Lymphatic: Atherosclerotic changes of the abdominal aorta without aneurysm. No retroperitoneal or upper abdominal adenopathy. Calcified mesenteric mass as discussed. No pelvic adenopathy. Reproductive: Mild to moderate prostatomegaly. Other: No ascites. Musculoskeletal: No acute bone finding or destructive bone process. Signs of cement augmentation of the L2 vertebral body with similar loss of height. Spinal degenerative changes. IMPRESSION: 1. 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 2. Hepatic lesion as discussed above in the posterior RIGHT hemiliver is suspicious for metastasis in this context. For all above findings in lieu of hepatic MRI, if the patient is not able to undergo hepatic MRI, biochemical correlation and DOTATATE PET may be helpful. 3. Bowel loop in the central abdomen with nodular enhancement approximately 8-9 mm along the wall in terms of greatest wall thickness. 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 4. Hepatic steatosis. 5. Resolution of inflammation about the pancreatic tail with nodular appearance of pancreatic tail. Attention on subsequent imaging is suggested. This may reflect sequela of prior inflammation given improvement but underlying lesion in this location while not favored is not excluded. Aortic  Atherosclerosis (ICD10-I70.0). These results will be  called to the ordering clinician or representative by the Radiologist Assistant, and communication documented in the PACS or [employer]. Electronically Signed   By: Zetta Bills M.D.   On: 06/17/2022 12:29    Labs:  CBC: Recent Labs    04/29/22 1310 04/30/22 0104 06/21/22 1510 07/09/22 0700  WBC 13.9* 9.9 10.8* 8.2  HGB 14.8 13.6 14.2 13.4  HCT 43.2 40.3 42.0 41.5  PLT 235 194 244 237    COAGS: Recent Labs    02/08/22 0408 04/29/22 1036  INR 1.1 2.2*  APTT  --  39*    BMP: Recent Labs    07/23/21 0000 07/24/21 0000 04/28/22 2256 04/29/22 1036 04/30/22 0104 06/21/22 1510  NA 145   < > 137 139 137 137  K 3.6   < > 4.6 4.2 4.3 4.5  CL 109*   < > 104 104 105 105  CO2 22   < > 19* 24 19* 24  GLUCOSE  --    < > 232* 173* 201* 201*  BUN 14   < > 27* 27* 24* 24*  CALCIUM 9.0   < > 9.6 9.8 9.1 9.9  CREATININE 0.8   < > 1.12 1.10 0.88 0.92  GFRNONAA 83.53   < > >60 >60 >60 >60  GFRAA >90  --   --   --   --   --    < > = values in this interval not displayed.    LIVER FUNCTION TESTS: Recent Labs    02/18/22 0134 04/28/22 2256 04/29/22 1036 06/21/22 1510  BILITOT 0.4 0.2* 1.0 0.4  AST '20 27 21 22  '$ ALT '14 18 13 11  '$ ALKPHOS 84 82 88 69  PROT 6.1* 7.2 7.2 7.1  ALBUMIN 2.9* 3.7 3.9 4.2    TUMOR MARKERS: No results for input(s): "AFPTM", "CEA", "CA199", "CHROMGRNA" in the last 8760 hours.  Assessment and Plan:  Hepatic lesion worrisome for metastatic disease.  Will proceed with image guided biopsy today by Dr. Dwaine Gale.  Risks and benefits of liver lesion biopsy was discussed with the patient and/or patient's family including, but not limited to bleeding, infection, damage to adjacent structures or low yield requiring additional tests.  All of the questions were answered and there is agreement to proceed.  Consent signed and in chart.   Thank you for allowing our service to participate in Jordan [name] 's care.  Electronically Signed: Murrell Redden, PA-C   07/09/2022, 7:44 AM      I spent a total of  30 Minutes  in face to face in clinical consultation, greater than 50% of which was counseling/coordinating care for liver lesion bx

## 2022-07-09 ENCOUNTER — Other Ambulatory Visit: Payer: Self-pay

## 2022-07-09 ENCOUNTER — Encounter (HOSPITAL_COMMUNITY): Payer: Self-pay

## 2022-07-09 ENCOUNTER — Ambulatory Visit: Payer: Medicare (Managed Care) | Admitting: Physical Therapy

## 2022-07-09 ENCOUNTER — Ambulatory Visit (HOSPITAL_COMMUNITY)
Admission: RE | Admit: 2022-07-09 | Discharge: 2022-07-09 | Disposition: A | Discharge status: Home / Self Care | Payer: Medicare (Managed Care) | Source: Ambulatory Visit | Attending: Physician Assistant | Admitting: Physician Assistant

## 2022-07-09 DIAGNOSIS — R160 Hepatomegaly, not elsewhere classified: Secondary | ICD-10-CM | POA: Diagnosis not present

## 2022-07-09 LAB — CBC
HCT: 41.5 % (ref 39.0–52.0)
Hemoglobin: 13.4 g/dL (ref 13.0–17.0)
MCH: 28.7 pg (ref 26.0–34.0)
MCHC: 32.3 g/dL (ref 30.0–36.0)
MCV: 88.9 fL (ref 80.0–100.0)
Platelets: 237 10*3/uL (ref 150–400)
RBC: 4.67 MIL/uL (ref 4.22–5.81)
RDW: 14.5 % (ref 11.5–15.5)
WBC: 8.2 10*3/uL (ref 4.0–10.5)
nRBC: 0 % (ref 0.0–0.2)

## 2022-07-09 LAB — PROTIME-INR
INR: 1.1 (ref 0.8–1.2)
Prothrombin Time: 14.3 seconds (ref 11.4–15.2)

## 2022-07-09 MED ORDER — MIDAZOLAM HCL 2 MG/2ML IJ SOLN
INTRAMUSCULAR | Status: AC
  Filled 2022-07-09: qty 2

## 2022-07-09 MED ORDER — FENTANYL CITRATE (PF) 100 MCG/2ML IJ SOLN
INTRAMUSCULAR | Status: AC
  Filled 2022-07-09: qty 2

## 2022-07-09 MED ORDER — LIDOCAINE HCL (PF) 1 % IJ SOLN
INTRAMUSCULAR | Status: AC
  Filled 2022-07-09: qty 30

## 2022-07-09 MED ORDER — GELATIN ABSORBABLE 12-7 MM EX MISC
CUTANEOUS | Status: AC
  Filled 2022-07-09: qty 1

## 2022-07-09 MED ORDER — SODIUM CHLORIDE 0.9 % IV SOLN: INTRAVENOUS | Status: DC

## 2022-07-09 NOTE — Progress Notes (Signed)
Mr. Spahr presented for US guided biopsy of right liver mass.  The mass could not be visualized by Ultrasound.  On further review, this lesion was likely present on CT scan for 2015 measuring approximately 2.2 x 2.0 cm compared to 4.1 x 4.0 cm.  Given that it could not be well visualized with Ultrasound, the depth of the lesion within the liver, and the slow rate of growth, I did not proceed with the biopsy at this time.  I will discuss the case with Ms. Thayil before proceeding.  [name], MD [phone]

## 2022-07-09 NOTE — Progress Notes (Signed)
I was able to discuss the case with Ms. Thayil.  She would still like to proceed with the biopsy given the possibility of a slow growing carcinoid tumor.  We will contact the patient and reschedule for CT guided biopsy.  Lexa, MD [phone]

## 2022-07-09 NOTE — Sedation Documentation (Signed)
Per Dr. Dwaine Gale, liver bx not needed. Procedure cancelled.

## 2022-07-12 ENCOUNTER — Telehealth (HOSPITAL_COMMUNITY): Payer: Self-pay

## 2022-07-12 ENCOUNTER — Other Ambulatory Visit (HOSPITAL_COMMUNITY): Payer: Self-pay | Admitting: Physician Assistant

## 2022-07-12 ENCOUNTER — Telehealth: Payer: Self-pay

## 2022-07-12 ENCOUNTER — Encounter: Payer: Self-pay | Admitting: Physical Therapy

## 2022-07-12 ENCOUNTER — Ambulatory Visit: Payer: Medicare (Managed Care) | Attending: Family Medicine | Admitting: Physical Therapy

## 2022-07-12 DIAGNOSIS — R2681 Unsteadiness on feet: Secondary | ICD-10-CM | POA: Insufficient documentation

## 2022-07-12 DIAGNOSIS — H8111 Benign paroxysmal vertigo, right ear: Secondary | ICD-10-CM | POA: Diagnosis not present

## 2022-07-12 MED ORDER — ENOXAPARIN SODIUM 100 MG/ML IJ SOSY: 100.0000 mg | PREFILLED_SYRINGE | Freq: Two times a day (BID) | INTRAMUSCULAR | 0 refills | Status: DC

## 2022-07-12 NOTE — Therapy (Signed)
OUTPATIENT PHYSICAL THERAPY TREATMENT NOTE/RE-CERT   Patient Name: Jordan [name] MRN: [id number] DOB:26-Oct-1941, 80 y.o., male Today's Date: 07/12/2022  PCP: [name], MD REFERRING PROVIDER: [name], MD   END OF SESSION:   PT End of Session - 07/12/22 1405     Visit Number 14    Number of Visits 17    Date for PT Re-Evaluation 08/11/22    Authorization Type [employer]    PT Start Time 1402    PT Stop Time 1444    PT Time Calculation (min) 42 min    Equipment Utilized During Treatment Gait belt    Activity Tolerance Patient tolerated treatment well    Behavior During Therapy WFL for tasks assessed/performed                  Past Medical History:  Diagnosis Date   Arthritis    R shoulder, bone spur   Arthritis    "hands" (03/25/2016)   Chronic diastolic heart failure (HCC)    Chronic lower back pain    Coronary heart disease    Dr Pernell Dupre   H/O cardiovascular stress test    perhaps last one was 2009   Heart murmur    12/29/11 echo: mild MR, no AS, trivial TR   History of gout    Hyperlipidemia    Hypertension    saw Dr. Linard Millers last earl;y- 2014, cardiac cath. last done ?2009, blocks seen didn't require any intervention at that point.   Narcolepsy    [name] 04-11-97; Mean Latency 1.52mn, SOREM 2   OSA on CPAP    NPSG 12-13-98 AHI 22.7   PONV (postoperative nausea and vomiting)    Presence of permanent cardiac pacemaker    Shortness of breath    with exertion    Stroke ([facility]    Type II diabetes mellitus ([place]    Past Surgical History:  Procedure Laterality Date   CARDIAC CATHETERIZATION  2009?   EP IMPLANTABLE DEVICE N/A 03/25/2016   Procedure: Pacemaker Implant - Dual Chamber;  Surgeon: GEvans Lance MD;  [facility];  Service: Cardiovascular;  Laterality: N/A;   FRACTURE SURGERY     INSERT / REPLACE / REMOVE PACEMAKER  03/24/2016   INTRAMEDULLARY (IM) NAIL INTERTROCHANTERIC Left 02/08/2022   Procedure:  INTRAMEDULLARY (IM) NAIL INTERTROCHANTRIC;  Surgeon: DMeredith Pel MD;  [facility]  Service: Orthopedics;  Laterality: Left;   IR KYPHO LUMBAR INC FX REDUCE BONE BX UNI/BIL CANNULATION INC/IMAGING  12/29/2021   IR RADIOLOGIST EVAL & MGMT  12/15/2021   LEFT AND RIGHT HEART CATHETERIZATION WITH CORONARY ANGIOGRAM N/A 06/05/2014   Procedure: LEFT AND RIGHT HEART CATHETERIZATION WITH CORONARY ANGIOGRAM;  Surgeon: HSinclair Grooms MD;  [facility];  Service: Cardiovascular;  Laterality: N/A;   NASAL FRACTURE SURGERY     SHOULDER ARTHROSCOPY WITH ROTATOR CUFF REPAIR AND SUBACROMIAL DECOMPRESSION Right 08/16/2013   Procedure: SHOULDER ARTHROSCOPY WITH ROTATOR CUFF REPAIR AND SUBACROMIAL DECOMPRESSION;  Surgeon: JNita Sells MD;  [facility]  Service: Orthopedics;  Laterality: Right;  Right shoulder arthroscopy rotator cuff repair, subacromial decompression.   TONSILLECTOMY     Patient Active Problem List   Diagnosis Date Noted   Acute deep vein thrombosis (DVT) of left lower extremity ([facility] 04/29/2022   Closed intertrochanteric fracture of left hip, initial encounter ([facility]    Closed left hip fracture ([facility] secondary to fall at home 02/08/2022   Paroxysmal atrial fibrillation (  [place]) 02/08/2022   Debility 01/08/2022   Constipation 01/07/2022   Physical deconditioning 73/42/8768   Acute metabolic encephalopathy 11/57/2620   Hypomagnesemia 01/04/2022   Pancreatitis 12/30/2021   Parkinson's disease ([place]) 12/21/2021   Parkinson disease ([place]) 12/18/2021   Frequent falls 12/18/2021   History of CVA (cerebrovascular accident) 12/18/2021   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   Coronary atherosclerosis 12/05/2007    REFERRING DIAG: I63.81 (ICD-10-CM) - 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[place]) G20 (ICD-10-CM) - Parkinson's disease ([place]) S72.145S (ICD-10-CM) - Type III open nondisplaced intertrochanteric fracture of left femur, sequela    THERAPY DIAG:  BPPV (benign paroxysmal positional vertigo), right  Rationale for Evaluation and Treatment Rehabilitation  PERTINENT HISTORY: PMHx: PD (diagnosed Jan 2022), HLD, OSA, DM, HTN, chronic diastolic HF, CVA, frequent falls, L2 compression fx, L hip fx   PRECAUTIONS: Fall and ICD/Pacemaker, history of compression fracture at L2 with recent kyphoplasty   SUBJECTIVE: Has had 2 attempts for a liver biopsy, but there have been other complications. Now it is re-scheduled for Friday. Has not had the chance to try the [facility] exercises. Only has been dizzy when rolling around in bed.   VITALS There were no vitals filed for this visit.   PAIN:  Are you having pain? No "I always have discomfort in my abdomen but it is nothing to complain about at the moment".   OBJECTIVE  TODAY'S  TREATMENT:   VESTIBULAR ASSESSMENT:            POSITIONAL TESTING: Right Sidelying:  upbeating right nystagmus, lasting only about 10-12 seconds upon first assessment.  Remaining 2 assessments, pt with mild nystagmus and a latency period of about 5-8 seconds, pt only demonstrating approx. 4-5 beats of nystagmus. Did not have the chance at the end of session to re-assess after 3rd treatment of Semont maneuver.      VESTIBULAR TREATMENT:   Canalith Repositioning: Semont to the R: x3 reps, 2nd and 3rd reps with use of mastoid vibration. 2nd PT needed to help with bed mobility, provided assist at pt's trunk and BLE.   PATIENT EDUCATION: Education details: Continued R BPPV and treatment with CRM. Discussed improvements of nystagmus today with use of Semont maneuver. Will plan to keep Friday appt and re-assess to see if it is still there. And depending on if it is cleared  vs. Still there will still plan to D/C and have pt perform Nestor Lewandowsky exercises at home for maintenance. Discussed holding off on these exercises for the next few days. Pt has a lot going on medically and will be getting a biopsy of his liver on Wednesday, and will plan for D/C at the end of this week. Pt and pt's spouse in agreement with plan.  Person educated: Patient and Spouse Education method: Explanation, Demonstration, Handout Education comprehension: verbalized understanding, needs further instructions     HOME EXERCISE PROGRAM: Access Code: [email or web address] URL: [email or web address] Date: 06/29/2022 Prepared by: Janann August  Exercises - Clamshell  - 1 x daily - 7 x weekly - 3 sets - 10 reps - Seated Scapular Retraction  - 1 x daily - 7 x weekly - 3 sets - 10 reps - Seated Long Arc Quad  - 1 x daily - 7 x weekly - 3 sets - 10 reps - Seated Hamstring Stretch  - 1 x daily - 7 x weekly - 3 sets - 10 reps - Sit to Stand  - 1 x daily - 7 x weekly - 3 sets - 10 reps - Seated Heel Raise  - 1 x daily - 7 x  weekly - 3 sets - 10 reps - Seated March  - 1 x daily - 7 x weekly - 3 sets - 10 reps - Seated Flexion Stretch with Swiss Ball  - 1 x daily - 7 x weekly - 3 sets - 10 reps - Brandt-Daroff Vestibular Exercise  - 1 x daily - 5 x weekly - 3 sets - Standing Quarter Turn with Counter Support  - 1 x daily - 7 x weekly - 3 sets - 10 reps     GOALS: Goals reviewed with patient? Yes       LONG TERM GOALS: Target date: 07/01/2022      Pt will be independent with final HEP for strength,gait, balance in order to build upon functional gains made in therapy.   Baseline:  Goal status: IN PROGRESS    2.  Patient will score >25/56 on [place] to demonstrate improved balance Baseline: 20/56; 30/56 on 8/22 Goal status: MET   3.  Pt will improve TUG time with RW vs. LRAD to 26 seconds or less in order to demo decrease fall risk.   Baseline: 33.32 seconds with RW; 30.22 seconds on 06/14/22; 38.85s on 8/31 w/RW  Goal status: NOT MET   4.  Pt will improve gait speed with RW vs. LRAD to at least 1.9 ft/sec in order to demo improved community mobility.  Baseline: .87 ft/sec with RW; 29.87 seconds = 1.09 ft/sec on 06/14/22; 15.47s = 2.12 ft/s on 8/31 w/RW  Goal status: MET    5.  Pt will ambulate >350' with RW versus LRAD on paved surfaces with supervision for improved community mobility. Baseline: 39' with RW on indoor surfaces on 06/14/22 Goal status: ONGOING   NEW LONG TERM GOALS FOR RE-CERT: Target date: 12/10/45 Pt will be independent with final HEP for strength,gait, balance in order to build upon functional gains made in therapy.   Baseline:  Goal status: IN PROGRESS   Pt will demonstrate resolution of R posterior canal BPPV in order to demo decr dizziness.   Baseline:  Goal status: IN PROGRESS        ASSESSMENT:   CLINICAL IMPRESSION: Re-cert performed today to include goals for R posterior canal BPPV. Pt with R upbeating rotary nystagmus in R sidelying position.  Performed Semont maneuver  x3 reps with 2 of the reps using vibration. With re-assessment, pt did demo decr intensity and duration of nystagmus compared to initial assessment. Will plan for one more visit to re-assess BPPV and further educate on Brandt Daroff maneuver to perform for home in case BPPV can't be cleared with maneuvers in session due to pt's mobility limitations. Pt and pt's spouse in agreement with plan.     OBJECTIVE IMPAIRMENTS Abnormal gait, decreased activity tolerance, decreased balance, decreased coordination, decreased endurance, decreased knowledge of use of DME, decreased mobility, decreased ROM, decreased strength, impaired flexibility, postural dysfunction, dizziness and pain.    ACTIVITY LIMITATIONS cleaning, community activity, driving, meal prep, laundry, yard work, and shopping.    PERSONAL FACTORS Age, Past/current experiences, Time since onset of injury/illness/exacerbation, and 3+ comorbidities:  PD, HLD, OSA, DM, HTN, chronic diastolic HF, CVA, frequent falls, L2 compression fx, L hip fx   are also affecting patient's functional outcome.      REHAB POTENTIAL: Good   CLINICAL DECISION MAKING: Evolving/moderate complexity   EVALUATION COMPLEXITY: Moderate   PLAN: PT FREQUENCY: 2x/week   PT DURATION: 4 weeks, per re-cert on 9/98/72    PLANNED INTERVENTIONS: Therapeutic exercises, Therapeutic activity, Neuromuscular re-education, Balance training, Gait training, Patient/Family education, Joint mobilization, Stair training, Vestibular training, Canalith repositioning, DME instructions, and Manual therapy   PLAN FOR NEXT SESSION: Re-assess R BPPV, treat again with Semont if it is still there. Plan to D/C and have pt just perform Nestor Lewandowsky.   Arliss Journey, PT, DPT 07/12/2022, 4:05 PM

## 2022-07-12 NOTE — Telephone Encounter (Signed)
Called pt to schedule liver biopsy for this upcoming Wednesday. He is currently on Xarelto and would need to hold x48 hours. The last time, the office had him bridge with Lovenox. My plan is to schedule him for 9/13 if we can get the medication sorted out. I have made a call to Dr. Zannie Kehr office to see if this is possible. No answer, left vm. I will wait to hear back from them and call pt to schedule after. AW

## 2022-07-12 NOTE — Telephone Encounter (Signed)
Called patient regarding refill of the lovenox injections. His wife Kasten Leveque answered and verbalized understanding.

## 2022-07-13 ENCOUNTER — Ambulatory Visit: Payer: Medicare (Managed Care) | Admitting: Adult Health

## 2022-07-13 ENCOUNTER — Other Ambulatory Visit: Payer: Self-pay | Admitting: Radiology

## 2022-07-13 ENCOUNTER — Encounter: Payer: Self-pay | Admitting: Adult Health

## 2022-07-13 VITALS — BP 127/84 | HR 74 | Ht 69.0 in | Wt 222.0 lb

## 2022-07-13 DIAGNOSIS — Z23 Encounter for immunization: Secondary | ICD-10-CM | POA: Diagnosis not present

## 2022-07-13 DIAGNOSIS — E1169 Type 2 diabetes mellitus with other specified complication: Secondary | ICD-10-CM | POA: Diagnosis not present

## 2022-07-13 DIAGNOSIS — I6381 Other cerebral infarction due to occlusion or stenosis of small artery: Secondary | ICD-10-CM | POA: Diagnosis not present

## 2022-07-13 DIAGNOSIS — Z8673 Personal history of transient ischemic attack (TIA), and cerebral infarction without residual deficits: Secondary | ICD-10-CM | POA: Diagnosis not present

## 2022-07-13 DIAGNOSIS — K769 Liver disease, unspecified: Secondary | ICD-10-CM | POA: Diagnosis not present

## 2022-07-13 DIAGNOSIS — R160 Hepatomegaly, not elsewhere classified: Secondary | ICD-10-CM

## 2022-07-13 DIAGNOSIS — I10 Essential (primary) hypertension: Secondary | ICD-10-CM | POA: Diagnosis not present

## 2022-07-13 DIAGNOSIS — G214 Vascular parkinsonism: Secondary | ICD-10-CM

## 2022-07-13 MED ORDER — QUETIAPINE FUMARATE 25 MG PO TABS: 25.0000 mg | ORAL_TABLET | Freq: Every day | ORAL | 5 refills | Status: DC

## 2022-07-13 NOTE — Patient Instructions (Addendum)
Your Plan:  Continue Sinemet IR 1 tab tree times daily and Sinemet CR 1 tab at night  Increase Seroquel to '25mg'$  nightly   Please schedule follow up visit with Dr. Annamaria Boots to discuss use of modafinil     Follow up with Dr. Leonie Man in 4 months or call earlier if needed             Thank you for coming to see [place] at [facility]. I hope we have been able to provide you high quality care today.  You may receive a patient satisfaction survey over the next few weeks. We would appreciate your feedback and comments so that we may continue to improve ourselves and the health of our patients.

## 2022-07-13 NOTE — Progress Notes (Signed)
[facility] [address] [place]. [place] [address] [phone]       OFFICE FOLLOW UP NOTE  [name] Date of Birth:  09-20-1941 Medical Record Number:  [id number]   Primary neurologist: Dr. Leonie Man Reason for Referral: stroke and vascular parkinsonism follow up   Chief Complaint  Patient presents with   Vascular parkinsonism    Rm 3, 6 month FU  wife- Jordan [name] ""want to discuss Modafinil"     SUBJECTIVE:   Update 07/13/2022 Jordan [name]: Patient returns for follow up visit accompanied by his wife  He has been relatively stable from neurological standpoint over the past few months.  He has remained on Sinemet 1 tab 3 times daily and Sinemet CR 1 tab nightly. Tolerating without side effects. Does have some worsening stiffness and slowness pursing in the morning and in the evening/bedtime. Can have occasional difficulty with right leg hesitancy/coordination especially in the evening. Use of RW for short distance ambulation otherwise use of w/c.  Recently completed outpatient therapies.  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  He does try to maintain some ADLs independently but does need some assistance by his wife.  He does fatigue quickly after some ADLs such as bathing or dressing.  Previously having issues with visual hallucinations but these have greatly subsided since starting Seroquel 12.5 mg nightly, does still have some occasional visual hallucinations but nonthreatening.  Does have frequent talking and acting out (punching and kicking) while sleeping.  He is on melatonin 10 mg nightly.  Admits to limited daytime physical activity, he usually sits and watches TV during the day, can fall asleep easily in chair unless watching something that keeps his interest. 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 Annamaria Boots back in March.  He does endorse nightly use of  CPAP.   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  He was discharged to SNF for deconditioning.  He underwent kyphoplasty on 2/28 for lumbar compression fracture by Dr. Estanislado Pandy  hospitalized again 3/1 for generalized abdominal pain and frequent falls, discharged to CIR on 3/10. During CIR stay, he was started on amantadine to help with activation and remained on low-dose Seroquel to help with sleep and decrease nighttime hallucinations.  Suffered a closed left hip fracture post fall 01/2022 and underwent surgical repair.  He was discharged to SNF rehab and returned home on 6/8.  Diagnosed with left common femoral vein, SF junction, femoral vein, proximal profunda vein, popliteal vein and posterior tibial vein DVT on 6/28. Placed on Xarelto with outpatient vascular surgery follow-up. Had issues with hematuria, improved after lowering dose to therapeutic dose but still having issues.  Recently transition to Lovenox for upcoming liver biopsy, wife reports resolution of discolored urine since stopping Xarelto.  Plans on pursuing cystoscopy in the near future.       History provided for reference purposes only Update 12/08/2021 Jordan [name]: Returns for follow-up visit after prior visit 3 months ago for concern of worsening gait and functioning. Hx of prior stroke and parkinsonism. At prior visit, adjusted Sinemet dosage - currently on 25-100 1 tab 3x daily and ER 25-100 nightly. Per patient and wife, gait has remained the same - denies worsening but also denies any improvement. Tolerating Sinemet  without side effects but no noticeable difference since dose change 3 months ago. Recently seen by Dr. Nelva Bush for lower back issues and plans to start PT for primarily lumbar spine on 3/2. Continues to use RW but limited  ambulate and only short distance. Does c/o neck and shoulder pain which is chronic but gradually worsening. Limited neck ROM. Denies radiculopathy. No prior cervical imaging. At times, will have difficulty holding head up and sits in a hunched over position. He will have difficulty holding on to small, especially lighter objects, right > left since stroke. C/o shortness of breath after talking for prolonged periods of time which has been present over the past 4-5 months. Denies SOB on exertion but wife believes that he does. Wife also notes voice being softer. Denies any swallowing difficulties. Does have chronic diabetic neuropathy with some worsening over the past 6 months. Recent A1c satisfactory. BUE tremors stable. He also c/o continued fatigue.   Stable from stroke standpoint without new stroke/TIA symptoms. Compliant on Plavix and atorvastatin without side effects.  Blood pressure today 112/75. Recent lipid panel satisfactory.   No further concerns at this time   Update 09/14/2021 Jordan [name]: Returns for sooner scheduled visit accompanied by his wife.  Concern regarding worsening gait and functioning since prior visit. He did have a fall on 8/23 with subsequent left acetabular fracture - ortho eval recommended nonoperative management.  Admitted to SNF rehab and discharged on 9/23. Reports since he returned home from rehab, gait has gradually worsened (increased shuffling and decreased step height) and has been requiring more assistance.  Working with [facility] PT initially 2x weekly now only 1x per week. Suffered a fall 10/29. Eval at [facility] walk-in clinic with xray showing possible compression fracture per wife report (unable to personally view via epic).  He is currently awaiting eval by orthopedics. He has had f/u with ortho for hip fx since returning home and f/u visit scheduled end of this month. Per wife, currently limited ambulation and exercise - will only walk to go to the bathroom. PT currently on hold until  further eval with our office and ortho.   Wife apparently increased Sinemet to 3 times daily (currently taking at 10am, 4pm and 10pm) approx 1 month ago. Per wife, slight improvement of gait and functioning since increase during the day but does have worsening gait and stiffness upon awakening. Follows with Dr. Annamaria Boots for CPAP and narcolepsy and REM sleep behavior disorder-started on Klonopin 0.'5mg'$  nightly 10/25 which has helped. Also reports hallucinations during SNF rehab stay - this has since improved.  Wife questions if Sinemet dosage needs to be adjusted  Eval by Dr. Rexene Alberts for second opinion of gait disorder and wife concern of Parkinson's disease. 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    Update 05/12/2021 Dr. Leonie Man; He returns for follow-up after last visit with Jordan [name] nurse practitioner in April 2022.  Is accompanied by his wife.  Patient has had no recurrent stroke or TIA symptoms.  He continues to have mild dizziness and imbalance in the right side paresthesias and diminished fine motor skills.  He occasionally has tingling of his fingers or his feet but these are not bothersome.  He is quite active and works out 3 days a week at [employer].  He remains on Plavix which is tolerating well without bruising or bleeding.  He takes Lipitor 20 mg daily and is  tolerating it well.  He states his primary care physician did check his lipid profile and A1c about 5 weeks ago but I do not have those results and he was told they were satisfactory.  He does have chronic low back pain and also complains of fatigue and tiredness.  He has not discussed this with primary physician had any work-up done for this.  Patient has history of parkinsonian tremor and has a prescription for Sinemet 25/103 times daily but states that he gets it only once a day or so as he forgets to take it and other  times.  Tremor seems intermittent and not constant and is not functionally disabling.  Wife feels that he does have a shuffling gait particularly when he is tired.  He however denies significant bradykinesia and is able to get up and walk by himself easily without assistance.  He denies drooling of saliva or micrographia.  He has not had any particular worsening of his tremors or other parkinsonian symptoms in recent months.    Update 02/19/2021 Jordan [name]: Mr. Tabar returns for acute visit per wife request due to recent onset of increased fatigue  Known hx of daytime fatigue with known OSA on CPAP and narcolepsy. Per wife, over the past 3 to 4 days, he has been experiencing increased daytime sleepiness where he is sleeping majority of the day as well as sleeping well at night.  Reports PCP recently changed Adderall IR to XR for narcolepsy but will occasionally miss a dose.  Also reports recently being started on sertraline 50 mg daily on 4/14 which he will take in the morning.  He also recently started PT on Monday for gait impairment and BPPV.  No other associated symptoms such as confusion, worsening cognition, weakness, numbness/tingling, speech deficit, visual changes or gait changes.  No further concerns at this time   Update 11/27/2020 Jordan [name]: Mr. Rogerson returns per patient request for sooner scheduled visit due to continued gait impairment and lower back pain.  He is currently unaccompanied.  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  Tremors have been stable without worsening.  He does feel as though his ambulation is worse with increased back pain. He did have a mechanical fall seen in ED on 11/05/2020 thankfully without injury.   He does have chronic back pain previously doing routine exercise prior to his stroke but has not returned back to routine exercise since. He admits to sedentary life style with limited physical activity. He will fall asleep  easily during the day watching TV and occasional issues with insomnia. He will take adderral '10mg'$  daily for narcolepsy and will take additional tablet as needed during the day.  No further concerns at this time.  Update 09/15/2020 Jordan [name]: Mr. Spraggins returns for stroke follow-up accompanied by his wife.  He has been doing well since prior visit with residual decreased right hand dexterity and decreased sensory right fingertips.  Denies new or worsening stroke/TIA symptoms. He does complain of short-term memory loss such as forgetting an actors name or random fact.  He also continues to experience R>L upper extremity tremors and gait impairment with imbalance with fall in 06/2020 resulting in nasal bone fracture.  Wife reports shuffling type gait when fatigued.  Remains on Plavix and atorvastatin for secondary stroke prevention without side effects.  Blood pressure today 126/73.  Glucose level stable per patient.  Reports ongoing compliance with CPAP for OSA management managed by pulmonology Dr. Annamaria Boots.  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  He complains of excessive daytime fatigue, vivid dreams and occasional insomnia. Per patient, he has not trialed any other medication for narcolepsy.  No further concerns at this time.  Update 05/15/2020 Jordan [name]: Mr. Carver is being seen for hospital follow-up accompanied by his wife.  He was discharged home from CIR on 04/29/2020 after 11 day stay.  Residual deficits of mild imbalance and mildly decreased sensation R finger tips and decreased right hand fine motor skills. Does report ongoing improvement currently working with home health PT. he has had gait impairment ongoing and right hand decreased fine motor control with tremors and was evaluated by Dr. Rexene Alberts on 12/11/2019 for possible Parkinson's but was not felt this was Parkinson's related.  He does continue to use cane for ambulation and denies any recent falls.  He is questioning  return to driving as he was advised no driving at discharge until further cleared.  Completed 3 weeks DAPT and continues on Plavix alone without bleeding or bruising.  Continues on atorvastatin 40 mg daily without myalgias.  Blood pressure today 118/84.  Glucose levels stable.  Endorses ongoing use of CPAP for OSA management.  No further concerns at this time.   Stroke admission 04/14/2020 Jordan [name] is a 81 y.o. male with history of  diabetes, stroke, hypertension, hyperlipidemia, permanent cardiac pacemaker  who presented on 04/14/2020 with R sided decreased sensation.  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  Previously on clopidogrel and recommended DAPT for 3 weeks then clopidogrel alone.  Presented in hypertensive urgency with BP 204/116 and recommended long-term BP goal normotensive range.  LDL 57 and recommended continuation of atorvastatin 40 mg daily.  Uncontrolled DM with A1c 7.9.  Other stroke risk factors include prior stroke 01/2018 with multifocal punctate infarcts concerning for embolic pattern, former tobacco use, EtOH use, obesity, CAD, OSA on CPAP and permanent cardiac pacemaker.  Evaluated by therapies who recommended CIR for residual deficits of imbalance and safety affecting ADLs and mobility.  Discharged to CIR on 04/18/2020.  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 Small vessel disease. Sinus dz. ASPECTS 10.    CTA head & neck no LVO. Aortic atherosclerosis.  MRI  L thalamic infarct. Small vessel disease. Atrophy.  2D Echo EF 65-70%. No source of embolus. LA mildly dilated.  Pacemaker interrogation no afib LDL 57 -continued atorvastatin 40 mg daily HgbA1c 7.9 Lovenox 40 mg sq daily for VTE prophylaxis clopidogrel 75 mg daily prior to admission, now on aspirin 81 mg daily and clopidogrel 75 mg daily. Continue DAPT x 3 weeks then plavix alone   Therapy  recommendations:  CIR Disposition:  CIR      ROS:   14 system review of systems performed and negative with exception of those listed in HPI  PMH:  Past Medical History:  Diagnosis Date   Arthritis    R shoulder, bone spur   Arthritis    "hands" (03/25/2016)   Chronic diastolic heart failure (HCC)    Chronic lower back pain    Coronary heart disease    Dr Pernell Dupre   Diverticulitis 12/2021   DVT (deep venous thrombosis) ([place]) 05/2022   left leg   H/O cardiovascular stress test    perhaps last one was 2009   Heart murmur    12/29/11 echo: mild  MR, no AS, trivial TR   History of gout    Hyperlipidemia    Hypertension    saw Dr. Linard Millers last earl;y- 2014, cardiac cath. last done ?2009, blocks seen didn't require any intervention at that point.   Narcolepsy    [name] 04-11-97; Mean Latency 1.69mn, SOREM 2   OSA on CPAP    NPSG 12-13-98 AHI 22.7   PONV (postoperative nausea and vomiting)    Presence of permanent cardiac pacemaker    Shortness of breath    with exertion    Stroke ([facility]    Type II diabetes mellitus ([facility]     PSH:  Past Surgical History:  Procedure Laterality Date   CARDIAC CATHETERIZATION  2009?   EP IMPLANTABLE DEVICE N/A 03/25/2016   Procedure: Pacemaker Implant - Dual Chamber;  Surgeon: GEvans Lance MD;  [facility];  Service: Cardiovascular;  Laterality: N/A;   FRACTURE SURGERY     INSERT / REPLACE / REMOVE PACEMAKER  03/24/2016   INTRAMEDULLARY (IM) NAIL INTERTROCHANTERIC Left 02/08/2022   Procedure: INTRAMEDULLARY (IM) NAIL INTERTROCHANTRIC;  Surgeon: DMeredith Pel MD;  [place] [facility]  Service: Orthopedics;  Laterality: Left;   IR KYPHO LUMBAR INC FX REDUCE BONE BX UNI/BIL CANNULATION INC/IMAGING  12/29/2021   IR RADIOLOGIST EVAL & MGMT  12/15/2021   LEFT AND RIGHT HEART CATHETERIZATION WITH CORONARY ANGIOGRAM N/A 06/05/2014   Procedure: LEFT AND RIGHT HEART CATHETERIZATION WITH CORONARY ANGIOGRAM;  Surgeon: HSinclair Grooms  MD;  [facility];  Service: Cardiovascular;  Laterality: N/A;   NASAL FRACTURE SURGERY     SHOULDER ARTHROSCOPY WITH ROTATOR CUFF REPAIR AND SUBACROMIAL DECOMPRESSION Right 08/16/2013   Procedure: SHOULDER ARTHROSCOPY WITH ROTATOR CUFF REPAIR AND SUBACROMIAL DECOMPRESSION;  Surgeon: JNita Sells MD;  [facility]  Service: Orthopedics;  Laterality: Right;  Right shoulder arthroscopy rotator cuff repair, subacromial decompression.   TONSILLECTOMY      Social History:  Social History   Socioeconomic History   Marital status: Married    Spouse name: KClaiborne [name]  Number of children: Not on file   Years of education: Not on file   Highest education level: Not on file  Occupational History   Occupation: [employer]  Occupation: [employer]  Tobacco Use   Smoking status: Former    Packs/day: 2.50    Years: 6.00    Total pack years: 15.00    Types: Cigarettes    Quit date: 06/05/1967    Years since quitting: 55.1   Smokeless tobacco: Never  Vaping Use   Vaping Use: Never used  Substance and Sexual Activity   Alcohol use: Yes    Comment: rare   Drug use: No   Sexual activity: Not Currently  Other Topics Concern   Not on file  Social History Narrative   Lives with wife and son   Right handed   Drinks 1 cups caffeine daily   Social Determinants of Health   Financial Resource Strain: Not on file  Food Insecurity: Not on file  Transportation Needs: Not on file  Physical Activity: Not on file  Stress: Not on file  Social Connections: Not on file  Intimate Partner Violence: Not on file    Family History:  Family History  Problem Relation Age of Onset   Sleep apnea Sister    Colon cancer Sister     Medications:   Current Outpatient Medications on File Prior to Visit  Medication Sig  Dispense Refill   acetaminophen (TYLENOL) 500 MG tablet Take 500-1,000 mg by mouth every 6 (six) hours as needed for moderate pain or mild pain.      amantadine (SYMMETREL) 100 MG capsule Take 2 capsules (200 mg total) by mouth daily. 180 capsule 3   atorvastatin (LIPITOR) 40 MG tablet Take 0.5 tablets (20 mg total) by mouth daily. 30 tablet 0   bifidobacterium infantis (ALIGN) capsule Take 1 capsule by mouth daily.     Calcium Carb-Cholecalciferol (CALCIUM 600-D PO) Take by mouth in the morning and at bedtime.     Camphor-Menthol-Methyl Sal (HM SALONPAS PAIN RELIEF EX) Apply 1 patch topically daily as needed (Back pain).     carbidopa-levodopa (SINEMET IR) 25-100 MG tablet Take 1 tablet by mouth 3 (three) times daily.     Carbidopa-Levodopa ER (SINEMET CR) 25-100 MG tablet controlled release TAKE 1 TABLET BY MOUTH EVERYDAY AT BEDTIME (Patient taking differently: Take 1 tablet by mouth at bedtime.) 90 tablet 1   cholecalciferol (VITAMIN D3) 25 MCG (1000 UNIT) tablet Take 1,000 Units by mouth daily.     clotrimazole-betamethasone (LOTRISONE) cream Apply 1 Application topically daily as needed (Rash).     docusate sodium (COLACE) 100 MG capsule Take 1 capsule (100 mg total) by mouth 2 (two) times daily. (Patient taking differently: Take 100 mg by mouth daily as needed for mild constipation or moderate constipation.) 10 capsule 0   enoxaparin (LOVENOX) 100 MG/ML injection Inject 1 mL (100 mg total) into the skin every 12 (twelve) hours. 4 mL 0   feeding supplement, GLUCERNA SHAKE, (GLUCERNA SHAKE) LIQD Take 237 mLs by mouth 2 (two) times daily between meals. Alternate with ensure Diabetic     furosemide (LASIX) 20 MG tablet USE AS NEEDED FOR UP TO 5 DAYS IF SWELLING IN LEGS RECURS. CONTACT CARDIOLOGY IS SWELLING DOES NOT IMPROVE OR GETS WORSE. 15 tablet 0   lidocaine (LIDODERM) 5 % Place 1 patch onto the skin daily as needed. Purchase over the counter. On for 12 hours and off for 12 hours 30 patch 0   magnesium gluconate (MAGONATE) 500 MG tablet Take 0.5 tablets (250 mg total) by mouth at bedtime. 30 tablet 0   Melatonin 10 MG TABS Take 10 mg by  mouth at bedtime.     metFORMIN (GLUCOPHAGE) 1000 MG tablet Take 1 tablet (1,000 mg total) by mouth daily with breakfast. (Patient taking differently: Take 500 mg by mouth 2 (two) times daily with a meal.) 30 tablet 0   metoprolol tartrate (LOPRESSOR) 25 MG tablet Take 1 tablet (25 mg total) by mouth 2 (two) times daily. 60 tablet 0   Multiple Vitamin (MULTIVITAMIN WITH MINERALS) TABS tablet Take 1 tablet by mouth daily.     NITROSTAT 0.4 MG SL tablet PLACE 1 TABLET (0.4 MG TOTAL) UNDER THE TONGUE EVERY 5 (FIVE) MINUTES AS NEEDED FOR CHEST PAIN. (Patient taking differently: Place 0.4 mg under the tongue every 5 (five) minutes as needed for chest pain.) 25 tablet 5   Omega-3 Fatty Acids (OMEGA 3 PO) Take 1,280 mg by mouth daily.     polyethylene glycol (MIRALAX / GLYCOLAX) 17 g packet Take 17 g by mouth daily. 14 each 0   polyvinyl alcohol (LIQUIFILM TEARS) 1.4 % ophthalmic solution Place 1 drop into both eyes as needed for dry eyes. (Patient taking differently: Place 1 drop into both eyes daily as needed for dry eyes.) 15 mL 0   QUEtiapine (SEROQUEL) 25 MG tablet TAKE 1/2 TABLETS (12.5  MG TOTAL) BY MOUTH AT BEDTIME. 45 tablet 1   Vitamin D, Ergocalciferol, (DRISDOL) 1.25 MG (50000 UNIT) CAPS capsule TAKE 1 CAPSULE (50,000 UNITS TOTAL) BY MOUTH EVERY 7 (SEVEN) DAYS 5 capsule 0   Wheat Dextrin (BENEFIBER PO) Take 10 mLs by mouth daily.     XARELTO 20 MG TABS tablet Take 20 mg by mouth daily. With food (Patient not taking: Reported on 07/13/2022)     No current facility-administered medications on file prior to visit.    Allergies:   Allergies  Allergen Reactions   Clonazepam Other (See Comments)    Mental changes   Zoloft [Sertraline] Other (See Comments)    Mental changes      OBJECTIVE:  Physical Exam  Vitals:   07/13/22 1508  BP: 127/84  Pulse: 74  Weight: 222 lb (100.7 kg)  Height: '5\' 9"'$  (1.753 m)    Body mass index is 32.78 kg/m. No results found.  General: Obese , very  pleasant elderly Caucasian male, seated, in no evident distress Head: head normocephalic and atraumatic.   Neck: supple with no carotid or supraclavicular bruits Cardiovascular: regular rate and rhythm, no murmurs Musculoskeletal: limited cervical and b/l shoulder ROM Skin:  no rash/petichiae Vascular:  Normal pulses all extremities   Neurologic Exam Mental Status: Awake and fully alert.  Moderately diminished facial expression.  Mild to moderate hypophonia.  Unable to appreciate dysarthria or aphasia.  Oriented to place and time. Recent and remote memory intact. Attention span, concentration and fund of knowledge appropriate. Mood and affect appropriate.   Cranial Nerves: Pupils equal, briskly reactive to light. Extraocular movements full without nystagmus. Visual fields full to confrontation. Hearing intact. Facial sensation intact. Face, tongue, palate moves normally and symmetrically.  Motor: Normal bulk and tone. Normal strength in all tested extremity muscles except mild bilateral hip flexor weakness.  Diminished fine finger movements on the right and orbits left over right upper extremity. Sensory.: intact to touch , pinprick , position and vibratory sensation.  Coordination: Rapid alternating movements normal in all extremities except slightly decreased right hand. Finger-to-nose and heel-to-shin performed accurately bilaterally.   Mild B/l R>L UE postural tremor with mild R cogwheel rigidity. Mild R>L bradykinesia.   Gait and Station: Deferred - RW not present at visit Reflexes: 1+ and symmetric. Toes downgoing.        ASSESSMENT/PLAN: Jordan [name] is a 81 y.o. 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  S/p left acetabular fracture 06/23/2021 after a mechanical fall with worsening gait  and increased need for assistance since return home from SNF rehab and recurrent fall with resultant left hip fracture 01/2022 requiring surgical procedure.  Evidence of extensive LLE DVT 04/2022 currently on anticoagulation    Vascular parkinsonism   -Continue Sinemet IR 1 tab 3 times daily and CR 1 tab at bedtime  -increase Seroquel to 25 mg nightly to further help with REM behavior and visual hallucinations, continue melatonin 10 mg nightly.  Intolerant to clonazepam.  -Continue amantadine per PMR recommendations  -Discussed increasing daily activity and exercise as tolerated, use of AD at all times unless otherwise instructed  Excessive daytime fatigue  -Likely multifactorial but likely large contributing factors underlying narcolepsy  -Advised to schedule follow-up visit with Dr. Annamaria Boots to discuss use of modafinil  Hx of  stroke  -Continue current treatment regimen and close follow-up with PCP for aggressive stroke risk factor management. Currently on Xarelto for acute DVT, once course completed, recommend restarting Plavix for secondary stroke prevention measures     Follow-up in 4 months with Dr. 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 Leonie Man  I spent a prolonged 58 minutes of face-to-face and non-face-to-face time with patient and wife.  This included previsit chart review including review of multiple hospitalizations, lab review, study review, order entry, electronic health record documentation, patient and wife education and prolonged discussion regarding above diagnoses and treatment plan and answered all the questions to patient and wife satisfaction  Frann Rider, AGNP-BC  [facility] [address] [place], [place] [address]  Phone [phone] Fax [phone] Note: This document was prepared with digital dictation and possible smart phrase  technology. Any transcriptional errors that result from this process are unintentional.

## 2022-07-14 ENCOUNTER — Ambulatory Visit (HOSPITAL_COMMUNITY): Payer: Medicare (Managed Care)

## 2022-07-14 ENCOUNTER — Ambulatory Visit (HOSPITAL_COMMUNITY)
Admission: RE | Admit: 2022-07-14 | Discharge: 2022-07-14 | Disposition: A | Discharge status: Home / Self Care | Payer: Medicare (Managed Care) | Source: Ambulatory Visit | Attending: Physician Assistant | Admitting: Physician Assistant

## 2022-07-14 ENCOUNTER — Ambulatory Visit (HOSPITAL_COMMUNITY)
Admission: RE | Admit: 2022-07-14 | Discharge: 2022-07-14 | Disposition: A | Discharge status: Home / Self Care | Payer: Medicare (Managed Care) | Source: Ambulatory Visit | Attending: Interventional Radiology | Admitting: Interventional Radiology

## 2022-07-14 ENCOUNTER — Encounter (HOSPITAL_COMMUNITY): Payer: Self-pay

## 2022-07-14 ENCOUNTER — Other Ambulatory Visit: Payer: Self-pay | Admitting: Physician Assistant

## 2022-07-14 ENCOUNTER — Other Ambulatory Visit: Payer: Self-pay

## 2022-07-14 ENCOUNTER — Other Ambulatory Visit (HOSPITAL_COMMUNITY): Payer: Self-pay | Admitting: Interventional Radiology

## 2022-07-14 ENCOUNTER — Inpatient Hospital Stay: Payer: Medicare (Managed Care) | Admitting: Hematology

## 2022-07-14 VITALS — BP 162/86 | HR 64 | Resp 16

## 2022-07-14 DIAGNOSIS — K862 Cyst of pancreas: Secondary | ICD-10-CM | POA: Insufficient documentation

## 2022-07-14 DIAGNOSIS — I110 Hypertensive heart disease with heart failure: Secondary | ICD-10-CM | POA: Insufficient documentation

## 2022-07-14 DIAGNOSIS — K760 Fatty (change of) liver, not elsewhere classified: Secondary | ICD-10-CM | POA: Diagnosis not present

## 2022-07-14 DIAGNOSIS — K769 Liver disease, unspecified: Secondary | ICD-10-CM | POA: Insufficient documentation

## 2022-07-14 DIAGNOSIS — K6389 Other specified diseases of intestine: Secondary | ICD-10-CM

## 2022-07-14 DIAGNOSIS — Z8673 Personal history of transient ischemic attack (TIA), and cerebral infarction without residual deficits: Secondary | ICD-10-CM | POA: Insufficient documentation

## 2022-07-14 DIAGNOSIS — R160 Hepatomegaly, not elsewhere classified: Secondary | ICD-10-CM | POA: Diagnosis not present

## 2022-07-14 DIAGNOSIS — I878 Other specified disorders of veins: Secondary | ICD-10-CM

## 2022-07-14 DIAGNOSIS — I2510 Atherosclerotic heart disease of native coronary artery without angina pectoris: Secondary | ICD-10-CM | POA: Diagnosis not present

## 2022-07-14 DIAGNOSIS — E785 Hyperlipidemia, unspecified: Secondary | ICD-10-CM | POA: Diagnosis not present

## 2022-07-14 DIAGNOSIS — E119 Type 2 diabetes mellitus without complications: Secondary | ICD-10-CM | POA: Diagnosis not present

## 2022-07-14 DIAGNOSIS — Z7901 Long term (current) use of anticoagulants: Secondary | ICD-10-CM | POA: Diagnosis not present

## 2022-07-14 DIAGNOSIS — C7B02 Secondary carcinoid tumors of liver: Secondary | ICD-10-CM | POA: Diagnosis not present

## 2022-07-14 DIAGNOSIS — Z452 Encounter for adjustment and management of vascular access device: Secondary | ICD-10-CM | POA: Diagnosis not present

## 2022-07-14 DIAGNOSIS — C801 Malignant (primary) neoplasm, unspecified: Secondary | ICD-10-CM | POA: Diagnosis not present

## 2022-07-14 HISTORY — PX: IR RADIOLOGY PERIPHERAL GUIDED IV START: IMG5598

## 2022-07-14 LAB — CBC
HCT: 39.4 % (ref 39.0–52.0)
Hemoglobin: 13.2 g/dL (ref 13.0–17.0)
MCH: 29.6 pg (ref 26.0–34.0)
MCHC: 33.5 g/dL (ref 30.0–36.0)
MCV: 88.3 fL (ref 80.0–100.0)
Platelets: 232 10*3/uL (ref 150–400)
RBC: 4.46 MIL/uL (ref 4.22–5.81)
RDW: 14.4 % (ref 11.5–15.5)
WBC: 8.3 10*3/uL (ref 4.0–10.5)
nRBC: 0 % (ref 0.0–0.2)

## 2022-07-14 LAB — PROTIME-INR
INR: 1.1 (ref 0.8–1.2)
Prothrombin Time: 14.4 seconds (ref 11.4–15.2)

## 2022-07-14 MED ORDER — FENTANYL CITRATE (PF) 100 MCG/2ML IJ SOLN
INTRAMUSCULAR | Status: AC | PRN
  Administered 2022-07-14: 25 ug via INTRAVENOUS

## 2022-07-14 MED ORDER — LIDOCAINE HCL 1 % IJ SOLN
INTRAMUSCULAR | Status: AC | PRN
  Administered 2022-07-14: 10 mL via INTRADERMAL

## 2022-07-14 MED ORDER — MIDAZOLAM HCL 2 MG/2ML IJ SOLN
INTRAMUSCULAR | Status: AC
  Filled 2022-07-14: qty 2

## 2022-07-14 MED ORDER — FENTANYL CITRATE (PF) 100 MCG/2ML IJ SOLN
INTRAMUSCULAR | Status: AC
  Filled 2022-07-14: qty 2

## 2022-07-14 MED ORDER — GELATIN ABSORBABLE 12-7 MM EX MISC
CUTANEOUS | Status: AC
  Filled 2022-07-14: qty 1

## 2022-07-14 MED ORDER — MIDAZOLAM HCL 2 MG/2ML IJ SOLN
INTRAMUSCULAR | Status: AC | PRN
  Administered 2022-07-14: .5 mg via INTRAVENOUS

## 2022-07-14 MED ORDER — NALOXONE HCL 0.4 MG/ML IJ SOLN
INTRAMUSCULAR | Status: AC
  Filled 2022-07-14: qty 1

## 2022-07-14 MED ORDER — MIDAZOLAM HCL 2 MG/2ML IJ SOLN
INTRAMUSCULAR | Status: AC | PRN
  Administered 2022-07-14: 1 mg via INTRAVENOUS

## 2022-07-14 MED ORDER — FENTANYL CITRATE (PF) 100 MCG/2ML IJ SOLN
INTRAMUSCULAR | Status: AC | PRN
  Administered 2022-07-14: 50 ug via INTRAVENOUS

## 2022-07-14 MED ORDER — LIDOCAINE-EPINEPHRINE 1 %-1:100000 IJ SOLN
INTRAMUSCULAR | Status: AC
  Filled 2022-07-14: qty 1

## 2022-07-14 MED ORDER — SODIUM CHLORIDE 0.9 % IV SOLN: INTRAVENOUS | Status: DC

## 2022-07-14 MED ORDER — FLUMAZENIL 0.5 MG/5ML IV SOLN
INTRAVENOUS | Status: AC
  Filled 2022-07-14: qty 5

## 2022-07-14 NOTE — H&P (Signed)
Chief Complaint: Patient was seen in consultation today for liver lesion biopsy at the request of Thayil,Irene T  Referring Physician(s): Lincoln Brigham  Supervising Physician: Sandi Mariscal  Patient Status: [facility] - Out-pt  History of Present Illness: Jordan [name] is a 81 y.o. male   Hx CVA; HTN; HLD; DM; CAD DVT- on Xarelto (LD 3 days ago- LD Lovenox 930 pm last night)  Seen by PCP for abd pain Left sided and present for several weeks  Oncology note 8/21: Recent CT scan concerning for small bowel mass. We reviewed the CT imaging in detail that showed small bowel mass, mesenteric mass and hepatic lesion. Findings are concerning for underlying malignancy such as carcinoid tumor. We reviewed recommended workup including serologic studies today, MRI abdomen/pelvis and biopsy. Patient is in agreement of the plan.   MRI 06/23/22:  IMPRESSION: 3.9 cm heterogeneous enhancing mass in segment 7 of the right hepatic lobe, which is nonspecific. Hepatic metastasis cannot be excluded Small benign hemangioma and cysts in the posterior right hepatic lobe. Small right lower quadrant mesenteric mass or lymphadenopathy, without significant change. Stable 2.1 cm cystic lesion in the pancreatic neck, with other tiny sub-cm cystic foci in the pancreatic head and body. Differential diagnosis includes pancreatic pseudocysts and indolent IPMNs. Recommend continued follow-up by MRI in 6 months. This recommendation follows ACR consensus guidelines: Management of Incidental Pancreatic Cysts: A White Paper of the ACR Incidental Findings Committee. J Am Coll Radiol 1610;96:045-409. 7 mm calculus in the left renal pelvis, with mild left pelvicaliectasis.   Pt was seen and biopsy attempted 07/09/22 Dr Mir note: Jordan [name] presented for US guided biopsy of right liver mass.  The mass could not be visualized by Ultrasound.  On further review, this lesion was likely present on CT scan for 2015  measuring approximately 2.2 x 2.0 cm compared to 4.1 x 4.0 cm.  Given that it could not be well visualized with Ultrasound, the depth of the lesion within the liver, and the slow rate of growth, I did not proceed with the biopsy at this time.   Now rescheduled to today - re attempt at Biopsy in IR today Will likely use CT modality per Dr Pascal Lux  Past Medical History:  Diagnosis Date   Arthritis    R shoulder, bone spur   Arthritis    "hands" (03/25/2016)   Chronic diastolic heart failure (HCC)    Chronic lower back pain    Coronary heart disease    Dr Pernell Dupre   Diverticulitis 12/2021   DVT (deep venous thrombosis) ([place]) 05/2022   left leg   H/O cardiovascular stress test    perhaps last one was 2009   Heart murmur    12/29/11 echo: mild MR, no AS, trivial TR   History of gout    Hyperlipidemia    Hypertension    saw Dr. Linard Millers last earl;y- 2014, cardiac cath. last done ?2009, blocks seen didn't require any intervention at that point.   Narcolepsy    [name] 04-11-97; Mean Latency 1.97mn, SOREM 2   OSA on CPAP    NPSG 12-13-98 AHI 22.7   PONV (postoperative nausea and vomiting)    Presence of permanent cardiac pacemaker    Shortness of breath    with exertion    Stroke ([place]    Type II diabetes mellitus ([place]     Past Surgical History:  Procedure Laterality Date   CARDIAC CATHETERIZATION  2009?   EP IMPLANTABLE DEVICE N/A  03/25/2016   Procedure: Pacemaker Implant - Dual Chamber;  Surgeon: Evans Lance, MD;  Location: [facility];  Service: Cardiovascular;  Laterality: N/A;   FRACTURE SURGERY     INSERT / REPLACE / REMOVE PACEMAKER  03/24/2016   INTRAMEDULLARY (IM) NAIL INTERTROCHANTERIC Left 02/08/2022   Procedure: INTRAMEDULLARY (IM) NAIL INTERTROCHANTRIC;  Surgeon: Meredith Pel, MD;  Location: [place];  Service: Orthopedics;  Laterality: Left;   IR KYPHO LUMBAR INC FX REDUCE BONE BX UNI/BIL CANNULATION INC/IMAGING  12/29/2021   IR RADIOLOGIST EVAL & MGMT   12/15/2021   LEFT AND RIGHT HEART CATHETERIZATION WITH CORONARY ANGIOGRAM N/A 06/05/2014   Procedure: LEFT AND RIGHT HEART CATHETERIZATION WITH CORONARY ANGIOGRAM;  Surgeon: Sinclair Grooms, MD;  Location: [facility] C[facility];  Service: Cardiovascular;  Laterality: N/A;   NASAL FRACTURE SURGERY     SHOULDER ARTHROSCOPY WITH ROTATOR CUFF REPAIR AND SUBACROMIAL DECOMPRESSION Right 08/16/2013   Procedure: SHOULDER ARTHROSCOPY WITH ROTATOR CUFF REPAIR AND SUBACROMIAL DECOMPRESSION;  Surgeon: [name], MD;  Location: [place];  Service: Orthopedics;  Laterality: Right;  Right shoulder arthroscopy rotator cuff repair, subacromial decompression.   TONSILLECTOMY      Allergies: Clonazepam and Zoloft [sertraline]  Medications: Prior to Admission medications   Medication Sig Start Date End Date Taking? Authorizing Provider  acetaminophen (TYLENOL) 500 MG tablet Take 500-1,000 mg by mouth every 6 (six) hours as needed for moderate pain or mild pain.   Yes [provider]  amantadine (SYMMETREL) 100 MG capsule Take 2 capsules (200 mg total) by mouth daily. 05/28/22  Yes Raulkar, Clide Deutscher, MD  atorvastatin (LIPITOR) 40 MG tablet Take 0.5 tablets (20 mg total) by mouth daily. 07/30/21  Yes Medina-Vargas, Monina C, NP  bifidobacterium infantis (ALIGN) capsule Take 1 capsule by mouth daily.   Yes [provider]  Calcium Carb-Cholecalciferol (CALCIUM 600-D PO) Take by mouth in the morning and at bedtime.   Yes [provider]  Camphor-Menthol-Methyl Sal (HM SALONPAS PAIN RELIEF EX) Apply 1 patch topically daily as needed (Back pain).   Yes [provider]  carbidopa-levodopa (SINEMET IR) 25-100 MG tablet Take 1 tablet by mouth 3 (three) times daily.   Yes [provider]  cholecalciferol (VITAMIN D3) 25 MCG (1000 UNIT) tablet Take 1,000 Units by mouth daily.   Yes [provider]  docusate sodium (COLACE) 100 MG capsule Take 1 capsule (100 mg total) by  mouth 2 (two) times daily. Patient taking differently: Take 100 mg by mouth daily as needed for mild constipation or moderate constipation. 02/17/22  Yes Hosie Poisson, MD  enoxaparin (LOVENOX) 100 MG/ML injection Inject 1 mL (100 mg total) into the skin every 12 (twelve) hours. 07/12/22  Yes Dede Query T, PA-C  magnesium gluconate (MAGONATE) 500 MG tablet Take 0.5 tablets (250 mg total) by mouth at bedtime. 03/24/22  Yes Raulkar, Clide Deutscher, MD  Melatonin 10 MG TABS Take 10 mg by mouth at bedtime.   Yes [provider]  metFORMIN (GLUCOPHAGE) 1000 MG tablet Take 1 tablet (1,000 mg total) by mouth daily with breakfast. Patient taking differently: Take 500 mg by mouth 2 (two) times daily with a meal. 01/18/22  Yes Love, Ivan Anchors, PA-C  metoprolol tartrate (LOPRESSOR) 25 MG tablet Take 1 tablet (25 mg total) by mouth 2 (two) times daily. 07/30/21  Yes Medina-Vargas, Monina C, NP  Multiple Vitamin (MULTIVITAMIN WITH MINERALS) TABS tablet Take 1 tablet by mouth daily. 02/19/18  Yes Georgette Shell,  MD  Omega-3 Fatty Acids (OMEGA 3 PO) Take 1,280 mg by mouth daily.   Yes [provider]  polyethylene glycol (MIRALAX / GLYCOLAX) 17 g packet Take 17 g by mouth daily. 01/09/22  Yes Antonieta Pert, MD  polyvinyl alcohol (LIQUIFILM TEARS) 1.4 % ophthalmic solution Place 1 drop into both eyes as needed for dry eyes. Patient taking differently: Place 1 drop into both eyes daily as needed for dry eyes. 01/18/22  Yes Love, Ivan Anchors, PA-C  Vitamin D, Ergocalciferol, (DRISDOL) 1.25 MG (50000 UNIT) CAPS capsule TAKE 1 CAPSULE (50,000 UNITS TOTAL) BY MOUTH EVERY 7 (SEVEN) DAYS 05/06/22  Yes Raulkar, Clide Deutscher, MD  Carbidopa-Levodopa ER (SINEMET CR) 25-100 MG tablet controlled release TAKE 1 TABLET BY MOUTH EVERYDAY AT BEDTIME Patient taking differently: Take 1 tablet by mouth at bedtime. 03/10/22   Frann Rider, NP  clotrimazole-betamethasone (LOTRISONE) cream Apply 1 Application topically daily as needed  (Rash). 06/21/22   [provider]  feeding supplement, GLUCERNA SHAKE, (GLUCERNA SHAKE) LIQD Take 237 mLs by mouth 2 (two) times daily between meals. Alternate with ensure Diabetic    [provider]  furosemide (LASIX) 20 MG tablet USE AS NEEDED FOR UP TO 5 DAYS IF SWELLING IN LEGS RECURS. CONTACT CARDIOLOGY IS SWELLING DOES NOT IMPROVE OR GETS WORSE. 01/26/22   Raulkar, Clide Deutscher, MD  lidocaine (LIDODERM) 5 % Place 1 patch onto the skin daily as needed. Purchase over the counter. On for 12 hours and off for 12 hours 01/18/22   Love, Ivan Anchors, PA-C  NITROSTAT 0.4 MG SL tablet PLACE 1 TABLET (0.4 MG TOTAL) UNDER THE TONGUE EVERY 5 (FIVE) MINUTES AS NEEDED FOR CHEST PAIN. Patient taking differently: Place 0.4 mg under the tongue every 5 (five) minutes as needed for chest pain. 03/31/16   Belva Crome, MD  QUEtiapine (SEROQUEL) 25 MG tablet Take 1 tablet (25 mg total) by mouth at bedtime. 07/13/22   Frann Rider, NP  Wheat Dextrin (BENEFIBER PO) Take 10 mLs by mouth daily.    [provider]  XARELTO 20 MG TABS tablet Take 20 mg by mouth daily. With food Patient not taking: Reported on 07/13/2022 06/22/22   [provider]     Family History  Problem Relation Age of Onset   Sleep apnea Sister    Colon cancer Sister     Social History   Socioeconomic History   Marital status: Married    Spouse name: [name]   Number of children: Not on file   Years of education: Not on file   Highest education level: Not on file  Occupational History   Occupation: [employer]   Occupation: [place] [employer]  Tobacco Use   Smoking status: Former    Packs/day: 2.50    Years: 6.00    Total pack years: 15.00    Types: Cigarettes    Quit date: 06/05/1967    Years since quitting: 55.1   Smokeless tobacco: Never  Vaping Use   Vaping Use: Never used  Substance and Sexual Activity   Alcohol use: Yes    Comment: rare   Drug use: No   Sexual activity: Not  Currently  Other Topics Concern   Not on file  Social History Narrative   Lives with wife and son   Right handed   Drinks 1 cups caffeine daily   Social Determinants of Health   Financial Resource Strain: Not on file  Food Insecurity: Not on file  Transportation Needs: Not on  file  Physical Activity: Not on file  Stress: Not on file  Social Connections: Not on file    Review of Systems: A 12 point ROS discussed and pertinent positives are indicated in the HPI above.  All other systems are negative.  Review of Systems  Constitutional:  Negative for fatigue and fever.  Respiratory:  Negative for cough and shortness of breath.   Cardiovascular:  Negative for chest pain.  Gastrointestinal:  Positive for abdominal pain.  Neurological:  Negative for weakness.  Psychiatric/Behavioral:  Negative for behavioral problems and confusion.     Vital Signs: BP (!) 149/79   Pulse 79   Resp 20   Ht '5\' 9"'$  (1.753 m)   Wt 222 lb (100.7 kg)   SpO2 94%   BMI 32.78 kg/m    Physical Exam Vitals reviewed.  HENT:     Mouth/Throat:     Mouth: Mucous membranes are moist.  Cardiovascular:     Rate and Rhythm: Normal rate and regular rhythm.     Heart sounds: Normal heart sounds.  Pulmonary:     Effort: Pulmonary effort is normal.     Breath sounds: Normal breath sounds.  Abdominal:     Palpations: Abdomen is soft.     Tenderness: There is no abdominal tenderness.  Musculoskeletal:        General: Normal range of motion.  Skin:    General: Skin is warm.  Neurological:     Mental Status: He is alert and oriented to person, place, and time.  Psychiatric:        Behavior: Behavior normal.     Imaging: CT ABDOMEN PELVIS W CONTRAST  Addendum Date: 07/09/2022   ADDENDUM REPORT: 07/09/2022 09:04 ADDENDUM: For clarification, the central, ileal mesenteric lesion currently measuring 2.1 x 2.1 x 2.4 cm measured 1.6 x 1.8 x 2.1 cm in 2015. Faintly visible area in the posterior RIGHT  hemiliver in 2015 raising the question of possibility of small lesion at best 2 x 2.2 cm in the posterior RIGHT hemiliver this areas now clearly visible measuring 4.1 x 4.0 cm. Findings are compatible with indolent behavior. Indolent carcinoid remains the primary differential consideration. Tumor marker correlation is suggested with DOTATATE PET for further evaluation as warranted. These results will be called to the ordering clinician or representative by the Radiologist Assistant, and communication documented in the PACS or [employer]. Electronically Signed   By: Zetta Bills M.D.   On: 07/09/2022 09:04   Result Date: 07/09/2022 CLINICAL DATA:  Lower abdominal pain for 2-3 weeks in a male age 67. EXAM: CT ABDOMEN AND PELVIS WITH CONTRAST TECHNIQUE: Multidetector CT imaging of the abdomen and pelvis was performed using the standard protocol following bolus administration of intravenous contrast. RADIATION DOSE REDUCTION: This exam was performed according to the departmental dose-optimization program which includes automated exposure control, adjustment of the mA and/or kV according to patient size and/or use of iterative reconstruction technique. CONTRAST:  29m ISOVUE-300 IOPAMIDOL (ISOVUE-300) INJECTION 61% COMPARISON:  December 30, 2021 FINDINGS: Lower chest: Pacer leads in the RIGHT heart. Signs of calcified coronary artery disease. Aortic atherosclerosis. No pericardial effusion or nodularity. Basilar atelectasis and scarring. Visible airways are patent. Hepatobiliary: Hepatic steatosis. There are areas of low attenuation in the liver which do not appear changed compared to previous imaging. Largest area in the posterior RIGHT hepatic lobe hepatic subsegment VII measuring approximately 4 cm greatest axial dimension 3.3 cm short axis is similar with respect to long axis and may  have increased with respect to short axis where it measured approximately 2.5 cm greatest short axis dimension on previous  imaging. There is a central area of calcification which was present on remote priors. The lesion surrounding the calcification was not present on more remote priors before December of 2022. A small low-density lesion in the inferior RIGHT hemiliver likely a cyst unchanged since 2015 (image 28/2) lobular hepatic contours. No pericholecystic stranding or signs of biliary duct dilation. Pancreas: Subtle low attenuation in the pancreas is diminished in size (image 24/2) expansion of the pancreatic tail up to 3 cm now 1.8 cm and without surrounding stranding. Small cystic area or area of ductal dilation in the pancreatic head is unchanged approximately 8 x 17 mm (image 33/2) Spleen: Normal. Adrenals/Urinary Tract: Adrenal glands are normal. Stable 12-13 mm LEFT renal pelvic calculus with stranding about the LEFT renal pelvis. This does not obstruct and there is no hydronephrosis on the LEFT. Benign-appearing renal cysts are present in the bilateral kidneys for which no additional dedicated follow-up imaging is recommended largest on the LEFT 2.4 cm greatest axial dimension, largest on the RIGHT 3.2 cm greatest axial dimension. Again no signs of obstruction and no calculi beyond the LEFT UPJ in the LEFT ureter. Stomach/Bowel: No acute gastrointestinal process. Enhancing partially calcified mass in the small bowel mesentery of the RIGHT mid abdomen measuring 2.1 x 2.8 cm present as far back undo that stable since previous imaging. Adjacent small lymph node showing mild increased enhancement 6 mm. 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 Vascular/Lymphatic: Atherosclerotic changes of the abdominal aorta without aneurysm. No  retroperitoneal or upper abdominal adenopathy. Calcified mesenteric mass as discussed. No pelvic adenopathy. Reproductive: Mild to moderate prostatomegaly. Other: No ascites. Musculoskeletal: No acute bone finding or destructive bone process. Signs of cement augmentation of the L2 vertebral body with similar loss of height. Spinal degenerative changes. IMPRESSION: 1. 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 2. Hepatic lesion as discussed above in the posterior RIGHT hemiliver is suspicious for metastasis in this context. For all above findings in lieu of hepatic MRI, if the patient is not able to undergo hepatic MRI, biochemical correlation and DOTATATE PET may be helpful. 3. Bowel loop in the central abdomen with nodular enhancement approximately 8-9 mm along the wall in terms of greatest wall thickness. 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 4. Hepatic steatosis. 5. Resolution of inflammation about the pancreatic tail with nodular appearance of pancreatic tail. Attention on subsequent imaging is suggested. This may reflect sequela of prior inflammation given improvement but underlying lesion in this location while not favored is not excluded. Aortic Atherosclerosis (ICD10-I70.0). These results will be called to the ordering clinician or representative by the Radiologist Assistant, and communication documented in the PACS or [employer]. Electronically Signed: By: Zetta Bills M.D. On: 06/17/2022 12:29   MR ABDOMEN W WO CONTRAST  Result Date: 06/23/2022 CLINICAL DATA:  Indeterminate liver lesion on recent CT. Mesenteric mass. EXAM: MRI ABDOMEN WITHOUT AND WITH CONTRAST  TECHNIQUE: Multiplanar multisequence MR imaging of the abdomen was performed  both before and after the administration of intravenous contrast. CONTRAST:  36m GADAVIST GADOBUTROL 1 MMOL/ML IV SOLN COMPARISON:  CT on 06/17/2022 FINDINGS: Lower chest: No acute findings. Hepatobiliary: A 1.8 cm benign hemangioma is seen in the posterior right hepatic lobe, and several tiny sub-cm benign-appearing cysts are also seen. A heterogeneous enhancing mass is seen in segment 7 of the right hepatic lobe which measures 3.9 x 2.7 cm on image 37/18, and has nonspecific characteristics. This could represent a hepatic metastasis. No other liver lesions identified. Gallbladder is unremarkable. No evidence of biliary ductal dilatation. Pancreas: A 2.1 x 1.7 cm cystic lesion in the pancreatic neck remains stable. Other tiny sub-cm cystic foci are also seen in the pancreatic head and body. Differential diagnosis includes pancreatic pseudocysts and indolent IPMNs. No evidence of main pancreatic ductal dilatation or peripancreatic edema. Spleen:  Within normal limits in size and appearance. Adrenals/Urinary Tract: No masses identified. Several benign-appearing renal cysts are again seen bilaterally (no followup imaging recommended). A calculus is seen in the left renal pelvis measuring 7 mm, with mild left pelvicaliectasis. No evidence of hydronephrosis. Stomach/Bowel: Unremarkable. Vascular/Lymphatic: No pathologically enlarged lymph nodes identified. No acute vascular findings. Other: Mass or lymphadenopathy in the right lower quadrant mesentery is again seen measuring approximately 2.3 cm, without significant change since prior study. Musculoskeletal:  No suspicious bone lesions identified. IMPRESSION: 3.9 cm heterogeneous enhancing mass in segment 7 of the right hepatic lobe, which is nonspecific. Hepatic metastasis cannot be excluded. Small benign hemangioma and cysts in the posterior right hepatic lobe. Small right lower quadrant mesenteric mass or lymphadenopathy, without significant change. Stable 2.1 cm  cystic lesion in the pancreatic neck, with other tiny sub-cm cystic foci in the pancreatic head and body. Differential diagnosis includes pancreatic pseudocysts and indolent IPMNs. Recommend continued follow-up by MRI in 6 months. This recommendation follows ACR consensus guidelines: Management of Incidental Pancreatic Cysts: A White Paper of the ACR Incidental Findings Committee. J Am Coll Radiol 26789;38:101-751 7 mm calculus in the left renal pelvis, with mild left pelvicaliectasis. Electronically Signed   By: JMarlaine HindM.D.   On: 06/23/2022 14:45    Labs:  CBC: Recent Labs    04/29/22 1310 04/30/22 0104 06/21/22 1510 07/09/22 0700  WBC 13.9* 9.9 10.8* 8.2  HGB 14.8 13.6 14.2 13.4  HCT 43.2 40.3 42.0 41.5  PLT 235 194 244 237    COAGS: Recent Labs    02/08/22 0408 04/29/22 1036 07/09/22 0700  INR 1.1 2.2* 1.1  APTT  --  39*  --     BMP: Recent Labs    07/23/21 0000 07/24/21 0000 04/28/22 2256 04/29/22 1036 04/30/22 0104 06/21/22 1510  NA 145   < > 137 139 137 137  K 3.6   < > 4.6 4.2 4.3 4.5  CL 109*   < > 104 104 105 105  CO2 22   < > 19* 24 19* 24  GLUCOSE  --    < > 232* 173* 201* 201*  BUN 14   < > 27* 27* 24* 24*  CALCIUM 9.0   < > 9.6 9.8 9.1 9.9  CREATININE 0.8   < > 1.12 1.10 0.88 0.92  GFRNONAA 83.53   < > >60 >60 >60 >60  GFRAA >90  --   --   --   --   --    < > = values in this interval not  displayed.    LIVER FUNCTION TESTS: Recent Labs    02/18/22 0134 04/28/22 2256 04/29/22 1036 06/21/22 1510  BILITOT 0.4 0.2* 1.0 0.4  AST '20 27 21 22  '$ ALT '14 18 13 11  '$ ALKPHOS 84 82 88 69  PROT 6.1* 7.2 7.2 7.1  ALBUMIN 2.9* 3.7 3.9 4.2    TUMOR MARKERS: No results for input(s): "AFPTM", "CEA", "CA199", "CHROMGRNA" in the last 8760 hours.  Assessment and Plan:  Scheduled for re attempt at liver lesion biopsy Was scheduled for same 9/8-- unable to properly visualize with [place] Risks and benefits of liver lesion biopy was discussed with the patient  and/or patient's family including, but not limited to bleeding, infection, damage to adjacent structures or low yield requiring additional tests.  All of the questions were answered and there is agreement to proceed. Consent signed and in chart.   Thank you for this interesting consult.  I greatly enjoyed meeting Jordan [name] and look forward to participating in their care.  A copy of this report was sent to the requesting provider on this date.  Electronically Signed: Lavonia Drafts, PA-C 07/14/2022, 9:14 AM   I spent a total of    25 Minutes in face to face in clinical consultation, greater than 50% of which was counseling/coordinating care for liver lesion biopsy

## 2022-07-14 NOTE — Discharge Instructions (Addendum)
Liver Biopsy, Care After  May remove dressing or bandaid and shower tomorrow.  Keep site clean and dry. Replace with clean dressing or bandaid as necessary.   Urgent needs - Interventional Radiology clinic [phone] Per Dr Mir: may restart blood thinner back on Friday as directed, and go to PT appointment Friday   After a liver biopsy, it is common to have these things in the area where the biopsy was done. You may: Have pain. Feel sore. Have bruising. You may also feel tired for a few days. Follow these instructions at home: Medicines Take over-the-counter and prescription medicines only as told by your doctor. If you were prescribed an antibiotic medicine, take it as told by your doctor. Do not stop taking the antibiotic, even if you start to feel better. Do not take medicines that may thin your blood. These medicines include aspirin and ibuprofen. Take them only if your doctor tells you to. If told, take steps to prevent problems with pooping (constipation). You may need to: Drink enough fluid to keep your pee (urine) pale yellow. Take medicines. You will be told what medicines to take. Eat foods that are high in fiber. These include beans, whole grains, and fresh fruits and vegetables. Limit foods that are high in fat and sugar. These include fried or sweet foods. Ask your doctor if you should avoid driving or using machines while you are taking your medicine. Caring for your incision Follow instructions from your doctor about how to take care of your cut from surgery (incisions). Make sure you: Wash your hands with soap and water for at least 20 seconds before and after you change your bandage. If you cannot use soap and water, use hand sanitizer. Change your bandage. Leavestitches or skin glue in place for at least two weeks. Leave tape strips alone unless you are told to take them off. You may trim the edges of the tape strips if they curl up. Check your incision every day for  signs of infection. Check for: Redness, swelling, or more pain. Fluid or blood. Warmth. Pus or a bad smell. Do not take baths, swim, or use a hot tub. Ask your doctor about taking showers or sponge baths. Activity Rest at home for 1-2 days, or as told by your doctor. Get up to take short walks every 1 to 2 hours. Ask for help if you feel weak or unsteady. Do not lift anything that is heavier than 10 lb (4.5 kg), or the limit that you are told. Do not play contact sports for 2 weeks after the procedure. Return to your normal activities as told by your doctor. Ask what activities are safe for you. General instructions Do not drink alcohol in the first week after the procedure. Plan to have a responsible adult care for you for the time you are told after you leave the hospital or clinic. This is important. It is up to you to get the results of your procedure. Ask how to get your results when they are ready. Keep all follow-up visits.   Contact a doctor if: You have more bleeding in your incision. Your incision swells, or is red and more painful. You have fluid that comes from your incision. You develop a rash. You have fever or chills. Get help right away if: You have swelling, bloating, or pain in your belly (abdomen). You get dizzy or faint. You vomit or you feel like vomiting. You have trouble breathing or feel short of breath. You have  chest pain. You have problems talking or seeing. You have trouble with your balance or moving your arms or legs. These symptoms may be an emergency. Get help right away. Call your local emergency services (911 in the [place] Do not wait to see if the symptoms will go away. Do not drive yourself to the hospital. Summary After the procedure, it is common to have pain, soreness, bruising, and tiredness. Your doctor will tell you how to take care of yourself at home. Change your bandage, take your medicines, and limit your activities as told by your  doctor. Call your doctor if you have symptoms of infection. Get help right away if your belly swells, your cut bleeds a lot, or you have trouble talking or breathing. This information is not intended to replace advice given to you by your health care provider. Make sure you discuss any questions you have with your healthcare provider. Document Revised: 09/01/2020 Document Reviewed: 09/01/2020 Elsevier Patient Education  2022 [place].  Moderate Conscious Sedation, Adult, Care After This sheet gives you information about how to care for yourself after your procedure. Your health care provider may also give you more specific instructions. If you have problems or questions, contact your health careprovider. What can I expect after the procedure? After the procedure, it is common to have: Sleepiness for several hours. Impaired judgment for several hours. Difficulty with balance. Vomiting if you eat too soon. Follow these instructions at home: For the time period you were told by your health care provider: Rest. Do not participate in activities where you could fall or become injured. Do not drive or use machinery. Do not drink alcohol. Do not take sleeping pills or medicines that cause drowsiness. Do not make important decisions or sign legal documents. Do not take care of children on your own. Eating and drinking  Follow the diet recommended by your health care provider. Drink enough fluid to keep your urine pale yellow. If you vomit: Drink water, juice, or soup when you can drink without vomiting. Make sure you have little or no nausea before eating solid foods.  General instructions Take over-the-counter and prescription medicines only as told by your health care provider. Have a responsible adult stay with you for the time you are told. It is important to have someone help care for you until you are awake and alert. Do not smoke. Keep all follow-up visits as told by your health  care provider. This is important. Contact a health care provider if: You are still sleepy or having trouble with balance after 24 hours. You feel light-headed. You keep feeling nauseous or you keep vomiting. You develop a rash. You have a fever. You have redness or swelling around the IV site. Get help right away if: You have trouble breathing. You have new-onset confusion at home. Summary After the procedure, it is common to feel sleepy, have impaired judgment, or feel nauseous if you eat too soon. Rest after you get home. Know the things you should not do after the procedure. Follow the diet recommended by your health care provider and drink enough fluid to keep your urine pale yellow. Get help right away if you have trouble breathing or new-onset confusion at home. This information is not intended to replace advice given to you by your health care provider. Make sure you discuss any questions you have with your healthcare provider. Document Revised: 02/15/2020 Document Reviewed: 09/13/2019 Elsevier Patient Education  2022 [employer].

## 2022-07-14 NOTE — Procedures (Signed)
Interventional Radiology Procedure Note  Procedure: CT guided liver mass biopsy  Indication: Right liver mass  Findings: Please refer to procedural dictation for full description.  Complications: None  EBL: < 10 mL  Miachel Roux, MD [phone]

## 2022-07-15 ENCOUNTER — Other Ambulatory Visit (HOSPITAL_COMMUNITY): Payer: Self-pay | Admitting: Interventional Radiology

## 2022-07-15 DIAGNOSIS — I878 Other specified disorders of veins: Secondary | ICD-10-CM

## 2022-07-15 HISTORY — PX: IR US GUIDE VASC ACCESS RIGHT: IMG2390

## 2022-07-15 LAB — SURGICAL PATHOLOGY

## 2022-07-16 ENCOUNTER — Ambulatory Visit: Payer: Medicare (Managed Care) | Admitting: Physical Therapy

## 2022-07-16 ENCOUNTER — Encounter: Payer: Self-pay | Admitting: Physical Therapy

## 2022-07-16 DIAGNOSIS — H40013 Open angle with borderline findings, low risk, bilateral: Secondary | ICD-10-CM | POA: Diagnosis not present

## 2022-07-16 DIAGNOSIS — R2681 Unsteadiness on feet: Secondary | ICD-10-CM

## 2022-07-16 DIAGNOSIS — H8111 Benign paroxysmal vertigo, right ear: Secondary | ICD-10-CM

## 2022-07-16 NOTE — Therapy (Addendum)
OUTPATIENT PHYSICAL THERAPY TREATMENT NOTE/DISCHARGE SUMMARY  Patient Name: Jordan [name] MRN: [id number] DOB:26-Mar-1941, 81 y.o., male Today's Date: 07/16/2022  PCP: [name], MD REFERRING PROVIDER: [name], MD   END OF SESSION:   PT End of Session - 07/16/22 1237     Visit Number 15    Number of Visits 17    Date for PT Re-Evaluation 08/11/22    Authorization Type [employer]    PT Start Time 1235   pt late to session   PT Stop Time 1259   full time not used due to D/C visit   PT Time Calculation (min) 24 min    Equipment Utilized During Treatment Gait belt    Activity Tolerance Patient tolerated treatment well    Behavior During Therapy WFL for tasks assessed/performed                  Past Medical History:  Diagnosis Date   Arthritis    R shoulder, bone spur   Arthritis    "hands" (03/25/2016)   Chronic diastolic heart failure (HCC)    Chronic lower back pain    Coronary heart disease    Dr Pernell Dupre   Diverticulitis 12/2021   DVT (deep venous thrombosis) ([place]) 05/2022   left leg   H/O cardiovascular stress test    perhaps last one was 2009   Heart murmur    12/29/11 echo: mild MR, no AS, trivial TR   History of gout    Hyperlipidemia    Hypertension    saw Dr. Linard Millers last earl;y- 2014, cardiac cath. last done ?2009, blocks seen didn't require any intervention at that point.   Narcolepsy    [name] 04-11-97; Mean Latency 1.32mn, SOREM 2   OSA on CPAP    NPSG 12-13-98 AHI 22.7   PONV (postoperative nausea and vomiting)    Presence of permanent cardiac pacemaker    Shortness of breath    with exertion    St[place] ([place]    Type II diabetes mellitus ([facility]    Past Surgical History:  Procedure Laterality Date   CARDIAC CATHETERIZATION  2009?   EP IMPLANTABLE DEVICE N/A 03/25/2016   Procedure: Pacemaker Implant - Dual Chamber;  Surgeon: GEvans Lance MD;  [facility];  Service: Cardiovascular;  Laterality: N/A;    FRACTURE SURGERY     INSERT / REPLACE / REMOVE PACEMAKER  03/24/2016   INTRAMEDULLARY (IM) NAIL INTERTROCHANTERIC Left 02/08/2022   Procedure: INTRAMEDULLARY (IM) NAIL INTERTROCHANTRIC;  Surgeon: DMeredith Pel MD;  [facility]  Service: Orthopedics;  Laterality: Left;   IR KYPHO LUMBAR INC FX REDUCE BONE BX UNI/BIL CANNULATION INC/IMAGING  12/29/2021   IR RADIOLOGIST EVAL & MGMT  12/15/2021   IR RADIOLOGY PERIPHERAL GUIDED IV START  07/14/2022   IR [place] VASC ACCESS RIGHT  07/15/2022   LEFT AND RIGHT HEART CATHETERIZATION WITH CORONARY ANGIOGRAM N/A 06/05/2014   Procedure: LEFT AND RIGHT HEART CATHETERIZATION WITH CORONARY ANGIOGRAM;  Surgeon: HSinclair Grooms MD;  [facility];  Service: Cardiovascular;  Laterality: N/A;   NASAL FRACTURE SURGERY     SHOULDER ARTHROSCOPY WITH ROTATOR CUFF REPAIR AND SUBACROMIAL DECOMPRESSION Right 08/16/2013   Procedure: SHOULDER ARTHROSCOPY WITH ROTATOR CUFF REPAIR AND SUBACROMIAL DECOMPRESSION;  Surgeon: JNita Sells MD;  [facility]  Service: Orthopedics;  Laterality: Right;  Right shoulder arthroscopy rotator cuff repair, subacromial decompression.   TONSILLECTOMY     Patient Active  Problem List   Diagnosis Date Noted   Acute deep vein thrombosis (DVT) of left lower extremity ([place]) 04/29/2022   Closed intertrochanteric fracture of left hip, initial encounter [facility])    Closed left hip fracture (HCC) secondary to fall at home 02/08/2022   Paroxysmal atrial fibrillation ([place]) 02/08/2022   Debility 01/08/2022   Constipation 01/07/2022   Physical deconditioning 01/74/9449   Acute metabolic encephalopathy 67/59/1638   Hypomagnesemia 01/04/2022   Pancreatitis 12/30/2021   Parkinson's disease ([place]) 12/21/2021   Parkinson disease ([place]) 12/18/2021   Frequent falls 12/18/2021   History of CVA (cerebrovascular accident) 12/18/2021   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   TIA (transient ischemic attack) 02/17/2018   Type 2 diabetes mellitus with hyperlipidemia ([place]) 02/17/2018   Insomnia 02/01/2018   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   Chronotropic incompetence 07/09/2014   Dyspnea on exertion 03/03/2014   HLD (hyperlipidemia) 12/13/2007   Essential hypertension 12/13/2007   Class 1 obesity 12/05/2007   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   Coronary atherosclerosis 12/05/2007    REFERRING DIAG: I63.81 (ICD-10-CM) - 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 Will get the results by Monday, haven't gotten them yet. Hasn't had any episodes of the dizziness the past few days.   VITALS There  were no vitals filed for this visit.   PAIN:  Are you having pain? No "I always have discomfort in my abdomen but it is nothing to complain about at the moment".   OBJECTIVE  TODAY'S TREATMENT:   Performed stand step transfer with RW from w/c <> mat table with supervision.   VESTIBULAR ASSESSMENT:            POSITIONAL TESTING: Right Sidelying:  upbeating right nystagmus, very mild, only approx. 5 beats   Re-assessed x2 reps after 1 maneuver of the Semont, pt with no nystagmus or dizziness       VESTIBULAR TREATMENT:   Canalith Repositioning: Semont to the R with use of mastoid vibration: x1 rep.   2nd PT needed to help with bed mobility, provided assist at pt's trunk and BLE.   PATIENT EDUCATION: Education details: Discussed resolution of R BPPV and D/C from therapy at this  time. Discussed importance of continuing to ambulate at home and perform strengthening exercises and that if dizziness does return can perform Nestor Lewandowsky exercises and if dizziness still persists, can get a referral to return to treat BPPV. Pt and pt's spouse verbalized understanding.  Person educated: Patient and Spouse Education method: Explanation, Demonstration Education comprehension: verbalized understanding, needs further instructions       HOME EXERCISE PROGRAM: Access Code: [email or web address] URL: [email or web address] Date: 06/29/2022 Prepared by: Janann August  Exercises - Clamshell  - 1 x daily - 7 x weekly - 3 sets - 10 reps - Seated Scapular Retraction  - 1 x daily - 7 x weekly - 3 sets - 10 reps - Seated Long Arc Quad  - 1 x daily - 7 x weekly - 3 sets - 10 reps - Seated Hamstring Stretch  - 1 x daily - 7 x weekly - 3 sets - 10 reps - Sit to Stand  - 1 x daily - 7 x weekly - 3 sets - 10 reps - Seated Heel Raise  - 1 x daily - 7 x weekly - 3 sets - 10 reps - Seated March  - 1 x daily - 7 x weekly - 3 sets - 10 reps - Seated Flexion Stretch with Swiss Ball  - 1 x daily - 7  x weekly - 3 sets - 10 reps - Brandt-Daroff Vestibular Exercise  - 1 x daily - 5 x weekly - 3 sets - Standing Quarter Turn with Counter Support  - 1 x daily - 7 x weekly - 3 sets - 10 reps   PHYSICAL THERAPY DISCHARGE SUMMARY  Visits from Start of Care: 15  Current functional level related to goals / functional outcomes: See LTGs/Clinical assessment statement.   Remaining deficits: Decr strength, decr functional mobility, ROM deficits, impaired balance, decr activity tolerance, gait abnormalities.    Education / Equipment: HEP, Fall prevention, Safety at home.    Patient agrees to discharge. Patient goals were partially met. Patient is being discharged due to being pleased with the current functional level. - resolution of R BPPV and for remainder of goals, pt reached his maximum potential for PT.      GOALS: Goals reviewed with patient? Yes       LONG TERM GOALS: Target date: 07/01/2022      Pt will be independent with final HEP for strength,gait, balance in order to build upon functional gains made in therapy.   Baseline:  Goal status: IN PROGRESS    2.  Patient will score >25/56 on [place] to demonstrate improved balance Baseline: 20/56; 30/56 on 8/22 Goal status: MET   3.  Pt will improve TUG time with RW vs. LRAD to 26 seconds or less in order to demo decrease fall risk.   Baseline: 33.32 seconds with RW; 30.22 seconds on 06/14/22; 38.85s on 8/31 w/RW  Goal status: NOT MET   4.  Pt will improve gait speed with RW vs. LRAD to at least 1.9 ft/sec in order to demo improved community mobility.  Baseline: .87 ft/sec with RW; 29.87 seconds = 1.09 ft/sec on 06/14/22; 15.47s = 2.12 ft/s on 8/31 w/RW  Goal status: MET    5.  Pt will ambulate >350' with RW versus LRAD on paved surfaces with supervision for improved community mobility. Baseline: 46' with RW on indoor surfaces on 06/14/22 Goal status: ONGOING   NEW LONG TERM GOALS FOR RE-CERT: Target date: 9/81/19 Pt will be  independent with final HEP  for strength,gait, balance in order to build upon functional gains made in therapy.   Baseline: pt has exercises, just a matter of doing them.  Goal status: PARTIALLY MET  Pt will demonstrate resolution of R posterior canal BPPV in order to demo decr dizziness.   Baseline:  Goal status: MET       ASSESSMENT:   CLINICAL IMPRESSION: Pt demonstrating very mild R BPPV in R sidelying position, treated x1 rep with Semont maneuver and use of mastoid vibration. Pt demonstrating resolution of BPPV after 1 rep of Semont when re-assessed 2 times. Discussed that since BPPV is cleared, pt will be discharged from PT at this time due to pt reaching his maximum potential with PT. Pt and pt's spouse in agreement with plan. Gave instructions that if BPPV returns, can try Nestor Lewandowsky at home.    OBJECTIVE IMPAIRMENTS Abnormal gait, decreased activity tolerance, decreased balance, decreased coordination, decreased endurance, decreased knowledge of use of DME, decreased mobility, decreased ROM, decreased strength, impaired flexibility, postural dysfunction, dizziness and pain.    ACTIVITY LIMITATIONS cleaning, community activity, driving, meal prep, laundry, yard work, and shopping.    PERSONAL FACTORS Age, Past/current experiences, Time since onset of injury/illness/exacerbation, and 3+ comorbidities:  PD, HLD, OSA, DM, HTN, chronic diastolic HF, CVA, frequent falls, L2 compression fx, L hip fx   are also affecting patient's functional outcome.      REHAB POTENTIAL: Good   CLINICAL DECISION MAKING: Evolving/moderate complexity   EVALUATION COMPLEXITY: Moderate   PLAN: PT FREQUENCY: 2x/week   PT DURATION: 4 weeks, per re-cert on 04/22/62    PLANNED INTERVENTIONS: Therapeutic exercises, Therapeutic activity, Neuromuscular re-education, Balance training, Gait training, Patient/Family education, Joint mobilization, Stair training, Vestibular training, Canalith repositioning, DME  instructions, and Manual therapy   PLAN FOR NEXT SESSION: D/C   Arliss Journey, PT, DPT 07/16/2022, 1:09 PM

## 2022-07-19 ENCOUNTER — Ambulatory Visit: Payer: Medicare (Managed Care) | Admitting: Physical Therapy

## 2022-07-19 ENCOUNTER — Telehealth: Payer: Self-pay | Admitting: Physician Assistant

## 2022-07-19 DIAGNOSIS — D3A8 Other benign neuroendocrine tumors: Secondary | ICD-10-CM

## 2022-07-19 NOTE — Telephone Encounter (Signed)
I spoke to [name] and his wife to go over the liver biopsy results from 07/15/2022.  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  Patient is scheduled for a follow-up with Dr. Morey Hummingbird this Friday to discuss treatment recommendations.  We will order a PET dotatate scan for further staging purposes.  Patient and his wife expressed understanding and satisfaction with the plan provided.

## 2022-07-21 ENCOUNTER — Ambulatory Visit: Payer: Medicare (Managed Care) | Admitting: Physical Therapy

## 2022-07-22 DIAGNOSIS — G4733 Obstructive sleep apnea (adult) (pediatric): Secondary | ICD-10-CM | POA: Diagnosis not present

## 2022-07-23 ENCOUNTER — Encounter: Payer: Self-pay | Admitting: Hematology

## 2022-07-23 ENCOUNTER — Other Ambulatory Visit: Payer: Self-pay

## 2022-07-23 ENCOUNTER — Inpatient Hospital Stay: Payer: Medicare (Managed Care) | Attending: Physician Assistant | Admitting: Hematology

## 2022-07-23 DIAGNOSIS — G20 Parkinson's disease: Secondary | ICD-10-CM | POA: Diagnosis not present

## 2022-07-23 DIAGNOSIS — Z79899 Other long term (current) drug therapy: Secondary | ICD-10-CM | POA: Insufficient documentation

## 2022-07-23 DIAGNOSIS — I10 Essential (primary) hypertension: Secondary | ICD-10-CM | POA: Insufficient documentation

## 2022-07-23 DIAGNOSIS — C7B8 Other secondary neuroendocrine tumors: Secondary | ICD-10-CM | POA: Insufficient documentation

## 2022-07-23 DIAGNOSIS — C7A8 Other malignant neuroendocrine tumors: Secondary | ICD-10-CM | POA: Diagnosis not present

## 2022-07-23 DIAGNOSIS — Z8673 Personal history of transient ischemic attack (TIA), and cerebral infarction without residual deficits: Secondary | ICD-10-CM | POA: Insufficient documentation

## 2022-07-23 DIAGNOSIS — E119 Type 2 diabetes mellitus without complications: Secondary | ICD-10-CM | POA: Diagnosis not present

## 2022-07-23 MED ORDER — RIVAROXABAN 15 MG PO TABS: 15.0000 mg | ORAL_TABLET | Freq: Every day | ORAL | 1 refills | Status: DC

## 2022-07-23 NOTE — Progress Notes (Signed)
I left vm on Mrs Ottaviano's number asking if they would be able to come today at 1340 instead of 1400. I asked that she call me back to confirm.  I provided my direct number.  I called the home number and there was no answer.

## 2022-07-23 NOTE — Progress Notes (Addendum)
[place]   Telephone:([phone]) [phone] Fax:(336) [phone]   Clinic Follow up Note   Patient Care Team: [name], MD as PCP - General (Family Medicine) [name], MD (Family Medicine)  Date of Service:  07/23/2022  CHIEF COMPLAINT: f/u of metastatic NET  CURRENT THERAPY:  PENDING Sandostatin injections  ASSESSMENT & PLAN:  Jordan [name] is a 81 y.o. male with   1. 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 CT AP on 06/17/22 showed: calcified mesenteric mass with suspected associated small bowel mass; suspicious right hepatic lesion; nodular enhancement along wall of bowel loop in central abdomen.  -abdomen MRI 06/23/22 showed: 3.9 cm mass in right hepatic lobe; small RLQ mesenteric mass or lymphadenopathy. 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 --I reviewed the work up and findings with the patient and his wife today. I discussed this is a slow-growing tumor and will likely not be what takes his life. I recommend obtaining a DOTATATE PET scan, which is specific to neuroendocrine tumor cells, to evaluate the extent of the NET cells. I explained that surgery is not recommended given his medical comorbidities and multiple liver mets, but will review his case in our GI tumor conference to get a surgical opinion.  -I reviewed systemic treatment options for NET, including ocreotide injections, lanreodtide injections, afinitor and lutathera infusions. We discussed treatment versus supportive care. He does have some mild abdominal pain, and if this is related to NET it will improve with treatment.  -after discussion, he would like to proceed with treatment. We will arrange for DOTATATE PET scan to be done soon and to start sandostatin injections soon after.  I reviewed the potential side effects, especially hypoglycemia, cholecystitis, pancreatitis, initial diarrhea and  abdominal cramps, pressure, he agrees to proceed.  2.  Stroke, Parkinson disease, hypertension, diabetes -Continue medical treatment, follow-up with his primary care physician and neurologist.  3. 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  He has had intermittent hematuria from Xarelto, I recommended reduce dose to 15 mg daily.  I called in a new prescription for him today   PLAN: -DOTATATE PET to be done soon. -Called in Xarelto 15 mg daily for him -Follow-up and Sandostatin injection after dotatate PET scan.   No problem-specific Assessment & Plan notes found for this encounter.   SUMMARY OF ONCOLOGIC HISTORY: Oncology History Overview Note   Cancer Staging  Metastatic malignant neuroendocrine tumor to liver [facility] Staging form: Liver, AJCC 8th Edition - Clinical: Stage IVB (cTX, cNX, pM1) - Signed by Truitt Merle, MD on 07/23/2022    Metastatic malignant neuroendocrine tumor to liver [facility])  06/17/2022 Imaging   CLINICAL DATA:  Lower abdominal pain for 2-3 weeks in a male age 59.   EXAM: CT ABDOMEN AND PELVIS WITH CONTRAST  IMPRESSION: 1. 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 2. Hepatic lesion as discussed above in the posterior RIGHT hemiliver is suspicious for metastasis in this context. For all above findings in lieu of hepatic MRI, if the patient is not able to undergo hepatic MRI, biochemical correlation and DOTATATE PET may be helpful. 3. Bowel loop in the central abdomen with nodular enhancement approximately 8-9 mm along the wall in terms of greatest wall thickness. 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 4. Hepatic  steatosis. 5. Resolution of inflammation about the pancreatic tail with nodular appearance of pancreatic tail. Attention on subsequent imaging is suggested. This may reflect sequela of prior inflammation given improvement but underlying lesion in this location while not favored is not excluded.  ADDENDUM: For clarification, the central, ileal mesenteric lesion currently measuring 2.1 x 2.1 x 2.4 cm measured 1.6 x 1.8 x 2.1 cm in 2015.   Faintly visible area in the posterior RIGHT hemiliver in 2015 raising the question of possibility of small lesion at best 2 x 2.2 cm in the posterior RIGHT hemiliver this areas now clearly visible measuring 4.1 x 4.0 cm. Findings are compatible with indolent behavior. Indolent carcinoid remains the primary differential consideration. Tumor marker correlation is suggested with DOTATATE PET for further evaluation as warranted.   06/23/2022 Imaging   EXAM: MRI ABDOMEN WITHOUT AND WITH CONTRAST  IMPRESSION: 3.9 cm heterogeneous enhancing mass in segment 7 of the right hepatic lobe, which is nonspecific. Hepatic metastasis cannot be excluded.   Small benign hemangioma and cysts in the posterior right hepatic lobe.   Small right lower quadrant mesenteric mass or lymphadenopathy, without significant change.   Stable 2.1 cm cystic lesion in the pancreatic neck, with other tiny sub-cm cystic foci in the pancreatic head and body. Differential diagnosis includes pancreatic pseudocysts and indolent IPMNs. Recommend continued follow-up by MRI in 6 months. This recommendation follows ACR consensus guidelines: Management of Incidental Pancreatic Cysts: A White Paper of the ACR Incidental Findings Committee. J Am Coll Radiol 9678;93:810-175.   7 mm calculus in the left renal pelvis, with mild left pelvicaliectasis.   07/14/2022 Initial Biopsy   FINAL MICROSCOPIC DIAGNOSIS:   A. LIVER, RIGHT, NEEDLE CORE BIOPSY:  -  Metastatic low-grade neuroendocrine tumor  (positive for chromogranin and synaptophysin).  -  Proliferation rate by Ki-67 less than 3%.    07/23/2022 Initial Diagnosis   Metastatic malignant neuroendocrine tumor to liver [facility])   07/23/2022 Cancer Staging   Staging form: Liver, AJCC 8th Edition - Clinical: Stage IVB (cTX, cNX, pM1) - Signed by Truitt Merle, MD on 07/23/2022      INTERVAL HISTORY:  BALTASAR TWILLEY is here for a follow up of metastatic NET. He was last seen by PA Murray Hodgkins on 06/21/22. He presents to the clinic accompanied by his sister. He reports he presented with abdominal pain that was rated up to 4/10. His pain is controlled with pain medications.   All other systems were reviewed with the patient and are negative.  MEDICAL HISTORY:  Past Medical History:  Diagnosis Date   Arthritis    R shoulder, bone spur   Arthritis    "hands" (03/25/2016)   Chronic diastolic heart failure (HCC)    Chronic lower back pain    Coronary heart disease    Dr Pernell Dupre   Diverticulitis 12/2021   DVT (deep venous thrombosis) ([place]) 05/2022   left leg   H/O cardiovascular stress test    perhaps last one was 2009   Heart murmur    12/29/11 echo: mild MR, no AS, trivial TR   History of gout    Hyperlipidemia    Hypertension    saw Dr. Linard Millers last earl;y- 2014, cardiac cath. last done ?2009, blocks seen didn't require any intervention at that point.   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   EP IMPLANTABLE DEVICE N/A 03/25/2016   Procedure: Pacemaker Implant - Dual Chamber;  Surgeon: GEvans Lance MD;  [facility];  Service: Cardiovascular;  Laterality: N/A;    FRACTURE SURGERY     INSERT / REPLACE / REMOVE PACEMAKER  03/24/2016   INTRAMEDULLARY (IM) NAIL INTERTROCHANTERIC Left 02/08/2022   Procedure: INTRAMEDULLARY (IM) NAIL INTERTROCHANTRIC;  Surgeon: DMeredith Pel MD;  [place] [facility]  Service: Orthopedics;  Laterality: Left;   IR KYPHO LUMBAR INC FX REDUCE BONE BX UNI/BIL CANNULATION INC/IMAGING  12/29/2021   IR RADIOLOGIST EVAL & MGMT  12/15/2021   IR RADIOLOGY PERIPHERAL GUIDED IV START  07/14/2022   IR [place] ACCESS RIGHT  07/15/2022   LEFT AND RIGHT HEART CATHETERIZATION WITH CORONARY ANGIOGRAM N/A 06/05/2014   Procedure: LEFT AND RIGHT HEART CATHETERIZATION WITH CORONARY ANGIOGRAM;  Surgeon: HSinclair Grooms MD;  [facility];  Service: Cardiovascular;  Laterality: N/A;   NASAL FRACTURE SURGERY     SHOULDER ARTHROSCOPY WITH ROTATOR CUFF REPAIR AND SUBACROMIAL DECOMPRESSION Right 08/16/2013   Procedure: SHOULDER ARTHROSCOPY WITH ROTATOR CUFF REPAIR AND SUBACROMIAL DECOMPRESSION;  Surgeon: JNita Sells MD;  [place]  Service: Orthopedics;  Laterality: Right;  Right shoulder arthroscopy rotator cuff repair, subacromial decompression.   TONSILLECTOMY      I have reviewed the social history and family history with the patient and they are unchanged from previous note.  ALLERGIES:  is allergic to clonazepam and zoloft [sertraline].  MEDICATIONS:  Current Outpatient Medications  Medication Sig Dispense Refill   Rivaroxaban (XARELTO) 15 MG TABS tablet Take 1 tablet (15 mg total) by mouth daily with supper. 90 tablet 1   acetaminophen (TYLENOL) 500 MG tablet Take 500-1,000 mg by mouth every 6 (six) hours as needed for moderate pain or mild pain.     amantadine (SYMMETREL) 100 MG capsule Take 2 capsules (200 mg total) by mouth daily. 180 capsule 3   atorvastatin (LIPITOR) 40 MG tablet Take 0.5 tablets (20 mg total) by mouth daily. 30 tablet 0   bifidobacterium infantis (ALIGN) capsule Take 1 capsule by mouth  daily.     Calcium Carb-Cholecalciferol (CALCIUM 600-D PO) Take by mouth in the morning and at bedtime.     Camphor-Menthol-Methyl Sal (HM SALONPAS PAIN RELIEF EX) Apply 1 patch topically daily as needed (Back pain).     carbidopa-levodopa (SINEMET IR) 25-100 MG tablet Take 1 tablet by mouth 3 (three) times daily.     Carbidopa-Levodopa ER (SINEMET CR) 25-100 MG tablet controlled release TAKE 1 TABLET BY MOUTH EVERYDAY AT BEDTIME (Patient taking differently: Take 1 tablet by mouth at bedtime.) 90 tablet 1   cholecalciferol (VITAMIN D3) 25 MCG (1000 UNIT) tablet Take 1,000 Units by mouth daily.     clotrimazole-betamethasone (LOTRISONE) cream Apply 1 Application topically daily as needed (Rash).     docusate sodium (COLACE) 100 MG capsule Take 1 capsule (100 mg total) by mouth 2 (two) times daily. (Patient taking differently: Take 100 mg by mouth daily as needed for  mild constipation or moderate constipation.) 10 capsule 0   feeding supplement, GLUCERNA SHAKE, (GLUCERNA SHAKE) LIQD Take 237 mLs by mouth 2 (two) times daily between meals. Alternate with ensure Diabetic     furosemide (LASIX) 20 MG tablet USE AS NEEDED FOR UP TO 5 DAYS IF SWELLING IN LEGS RECURS. CONTACT CARDIOLOGY IS SWELLING DOES NOT IMPROVE OR GETS WORSE. 15 tablet 0   lidocaine (LIDODERM) 5 % Place 1 patch onto the skin daily as needed. Purchase over the counter. On for 12 hours and off for 12 hours 30 patch 0   magnesium gluconate (MAGONATE) 500 MG tablet Take 0.5 tablets (250 mg total) by mouth at bedtime. 30 tablet 0   Melatonin 10 MG TABS Take 10 mg by mouth at bedtime.     metFORMIN (GLUCOPHAGE) 1000 MG tablet Take 1 tablet (1,000 mg total) by mouth daily with breakfast. (Patient taking differently: Take 500 mg by mouth 2 (two) times daily with a meal.) 30 tablet 0   metoprolol tartrate (LOPRESSOR) 25 MG tablet Take 1 tablet (25 mg total) by mouth 2 (two) times daily. 60 tablet 0   Multiple Vitamin (MULTIVITAMIN WITH MINERALS)  TABS tablet Take 1 tablet by mouth daily.     NITROSTAT 0.4 MG SL tablet PLACE 1 TABLET (0.4 MG TOTAL) UNDER THE TONGUE EVERY 5 (FIVE) MINUTES AS NEEDED FOR CHEST PAIN. (Patient taking differently: Place 0.4 mg under the tongue every 5 (five) minutes as needed for chest pain.) 25 tablet 5   Omega-3 Fatty Acids (OMEGA 3 PO) Take 1,280 mg by mouth daily.     polyethylene glycol (MIRALAX / GLYCOLAX) 17 g packet Take 17 g by mouth daily. 14 each 0   polyvinyl alcohol (LIQUIFILM TEARS) 1.4 % ophthalmic solution Place 1 drop into both eyes as needed for dry eyes. (Patient taking differently: Place 1 drop into both eyes daily as needed for dry eyes.) 15 mL 0   QUEtiapine (SEROQUEL) 25 MG tablet Take 1 tablet (25 mg total) by mouth at bedtime. 30 tablet 5   Vitamin D, Ergocalciferol, (DRISDOL) 1.25 MG (50000 UNIT) CAPS capsule TAKE 1 CAPSULE (50,000 UNITS TOTAL) BY MOUTH EVERY 7 (SEVEN) DAYS 5 capsule 0   Wheat Dextrin (BENEFIBER PO) Take 10 mLs by mouth daily.     No current facility-administered medications for this visit.    PHYSICAL EXAMINATION: ECOG PERFORMANCE STATUS: 3 - Symptomatic, >50% confined to bed  Vitals:   07/23/22 1358  BP: 121/63  Pulse: 72  Resp: 15  Temp: 98.7 F (37.1 C)  SpO2: 93%   Wt Readings from Last 3 Encounters:  07/23/22 221 lb 12.8 oz (100.6 kg)  07/14/22 222 lb (100.7 kg)  07/13/22 222 lb (100.7 kg)     GENERAL:alert, no distress and comfortable SKIN: skin color normal, no rashes or significant lesions EYES: normal, Conjunctiva are pink and non-injected, sclera clear  NEURO: alert & oriented x 3 with fluent speech  LABORATORY DATA:  I have reviewed the data as listed    Latest Ref Rng & Units 07/14/2022    9:17 AM 07/09/2022    7:00 AM 06/21/2022    3:10 PM  CBC  WBC 4.0 - 10.5 K/uL 8.3  8.2  10.8   Hemoglobin 13.0 - 17.0 g/dL 13.2  13.4  14.2   Hematocrit 39.0 - 52.0 % 39.4  41.5  42.0   Platelets 150 - 400 K/uL 232  237  244         Latest  Ref  Rng & Units 06/21/2022    3:10 PM 04/30/2022    1:04 AM 04/29/2022   10:36 AM  CMP  Glucose 70 - 99 mg/dL 201  201  173   BUN 8 - 23 mg/dL 24  24  27    Creatinine 0.61 - 1.24 mg/dL 0.92  0.88  1.10   Sodium 135 - 145 mmol/L 137  137  139   Potassium 3.5 - 5.1 mmol/L 4.5  4.3  4.2   Chloride 98 - 111 mmol/L 105  105  104   CO2 22 - 32 mmol/L 24  19  24    Calcium 8.9 - 10.3 mg/dL 9.9  9.1  9.8   Total Protein 6.5 - 8.1 g/dL 7.1   7.2   Total Bilirubin 0.3 - 1.2 mg/dL 0.4   1.0   Alkaline Phos 38 - 126 U/L 69   88   AST 15 - 41 U/L 22   21   ALT 0 - 44 U/L 11   13       RADIOGRAPHIC STUDIES: I have personally reviewed the radiological images as listed and agreed with the findings in the report. No results found.    No orders of the defined types were placed in this encounter.  All questions were answered. The patient knows to call the clinic with any problems, questions or concerns. No barriers to learning was detected. The total time spent in the appointment was 55 minutes.     Truitt Merle, MD 07/23/2022   I, Wilburn Mylar, am acting as scribe for Truitt Merle, MD.   I have reviewed the above documentation for accuracy and completeness, and I agree with the above.

## 2022-07-25 ENCOUNTER — Other Ambulatory Visit: Payer: Self-pay | Admitting: Neurology

## 2022-07-26 DIAGNOSIS — M9905 Segmental and somatic dysfunction of pelvic region: Secondary | ICD-10-CM | POA: Diagnosis not present

## 2022-07-26 DIAGNOSIS — M5136 Other intervertebral disc degeneration, lumbar region: Secondary | ICD-10-CM | POA: Diagnosis not present

## 2022-07-26 DIAGNOSIS — M9904 Segmental and somatic dysfunction of sacral region: Secondary | ICD-10-CM | POA: Diagnosis not present

## 2022-07-26 DIAGNOSIS — M9903 Segmental and somatic dysfunction of lumbar region: Secondary | ICD-10-CM | POA: Diagnosis not present

## 2022-07-28 ENCOUNTER — Other Ambulatory Visit: Payer: Self-pay

## 2022-07-28 DIAGNOSIS — C7B8 Other secondary neuroendocrine tumors: Secondary | ICD-10-CM

## 2022-07-28 NOTE — Progress Notes (Signed)
The proposed treatment discussed in conference is for discussion purpose only and is not a binding recommendation.  The patients have not been physically examined, or presented with their treatment options.  Therefore, final treatment plans cannot be decided.  

## 2022-07-29 ENCOUNTER — Ambulatory Visit (INDEPENDENT_AMBULATORY_CARE_PROVIDER_SITE_OTHER): Payer: Medicare (Managed Care)

## 2022-07-29 DIAGNOSIS — I495 Sick sinus syndrome: Secondary | ICD-10-CM

## 2022-07-29 LAB — CUP PACEART REMOTE DEVICE CHECK
Date Time Interrogation Session: 20230928122050
Implantable Lead Implant Date: 20170525
Implantable Lead Implant Date: 20170525
Implantable Lead Location: 753859
Implantable Lead Location: 753860
Implantable Lead Model: 377
Implantable Lead Model: 377
Implantable Lead Serial Number: 49470224
Implantable Lead Serial Number: 49485613
Implantable Pulse Generator Implant Date: 20170525
Pulse Gen Model: 394969
Pulse Gen Serial Number: 68798589

## 2022-08-01 ENCOUNTER — Encounter: Payer: Self-pay | Admitting: Internal Medicine

## 2022-08-02 ENCOUNTER — Other Ambulatory Visit (HOSPITAL_COMMUNITY): Payer: Self-pay

## 2022-08-02 MED ORDER — MODAFINIL 100 MG PO TABS: ORAL_TABLET | ORAL | 0 refills | Status: DC

## 2022-08-02 NOTE — Telephone Encounter (Signed)
Modafinil sent to [facility] to try for alertness. I don't think it will affect dreams much.

## 2022-08-02 NOTE — Telephone Encounter (Signed)
Mychart message sent by pt: [name] (supporting Deneise Lever, MD) 17 hours ago (4:03 PM)    During past visits I think we discussed use of Modafinil for treatment of narcolepsy-related EDS.  I continue to have EDS, and sometimes wildly active & vivid dreams at night that disturb my sleep (not to mention my wife's sleep).  I would like to try Modafinil to treat the EDS and fatigue.  If this requires an office visit to assess and discuss further, please let met know.  I would be happy to schedule an appointment.  To facilitate communications, please contact my wife, [name], @ [phone] to advise of new prescription or of need for office visit.  Thank you.  Jordan [name]     Dr. Annamaria Boots, please advise.

## 2022-08-03 DIAGNOSIS — R81 Glycosuria: Secondary | ICD-10-CM | POA: Diagnosis not present

## 2022-08-03 DIAGNOSIS — R3915 Urgency of urination: Secondary | ICD-10-CM | POA: Diagnosis not present

## 2022-08-03 DIAGNOSIS — G20A1 Parkinson's disease without dyskinesia, without mention of fluctuations: Secondary | ICD-10-CM | POA: Diagnosis not present

## 2022-08-03 DIAGNOSIS — R310 Gross hematuria: Secondary | ICD-10-CM | POA: Diagnosis not present

## 2022-08-03 DIAGNOSIS — R800 Isolated proteinuria: Secondary | ICD-10-CM | POA: Diagnosis not present

## 2022-08-03 DIAGNOSIS — N401 Enlarged prostate with lower urinary tract symptoms: Secondary | ICD-10-CM | POA: Diagnosis not present

## 2022-08-03 DIAGNOSIS — R350 Frequency of micturition: Secondary | ICD-10-CM | POA: Diagnosis not present

## 2022-08-04 ENCOUNTER — Telehealth: Payer: Self-pay

## 2022-08-04 NOTE — Telephone Encounter (Signed)
This nurse received a call from this patients wife stating that she needs to schedule a follow up appointment for patients PET that is scheduled for 10/12.  This nurse advised that patient is scheduled for follow up on Tues 10/17. She stats that she was not aware.  Informed of labs and office visit appointments.  Acknowledged understanding and no further questions at this time.

## 2022-08-04 NOTE — Progress Notes (Signed)
Remote pacemaker transmission.   

## 2022-08-12 ENCOUNTER — Encounter (HOSPITAL_COMMUNITY)
Admission: RE | Admit: 2022-08-12 | Discharge: 2022-08-12 | Disposition: A | Discharge status: Home / Self Care | Payer: Medicare (Managed Care) | Source: Ambulatory Visit | Attending: Hematology | Admitting: Hematology

## 2022-08-12 DIAGNOSIS — C7B8 Other secondary neuroendocrine tumors: Secondary | ICD-10-CM | POA: Insufficient documentation

## 2022-08-12 DIAGNOSIS — C7A019 Malignant carcinoid tumor of the small intestine, unspecified portion: Secondary | ICD-10-CM | POA: Diagnosis not present

## 2022-08-12 MED ORDER — COPPER CU 64 DOTATATE 1 MCI/ML IV SOLN
4.0000 | Freq: Once | INTRAVENOUS | Status: AC
  Administered 2022-08-12: 4.25 via INTRAVENOUS

## 2022-08-13 DIAGNOSIS — E1151 Type 2 diabetes mellitus with diabetic peripheral angiopathy without gangrene: Secondary | ICD-10-CM | POA: Diagnosis not present

## 2022-08-13 DIAGNOSIS — I739 Peripheral vascular disease, unspecified: Secondary | ICD-10-CM | POA: Diagnosis not present

## 2022-08-13 DIAGNOSIS — E1142 Type 2 diabetes mellitus with diabetic polyneuropathy: Secondary | ICD-10-CM | POA: Diagnosis not present

## 2022-08-13 DIAGNOSIS — L603 Nail dystrophy: Secondary | ICD-10-CM | POA: Diagnosis not present

## 2022-08-15 ENCOUNTER — Other Ambulatory Visit: Payer: Self-pay | Admitting: Adult Health

## 2022-08-16 ENCOUNTER — Encounter: Payer: Self-pay | Admitting: Adult Health

## 2022-08-16 ENCOUNTER — Other Ambulatory Visit: Payer: Self-pay | Admitting: Physician Assistant

## 2022-08-16 DIAGNOSIS — D3A8 Other benign neuroendocrine tumors: Secondary | ICD-10-CM

## 2022-08-16 NOTE — Telephone Encounter (Signed)
This patient needs to get in to see Dr. Leonie Man as this has been requested the past couple times I have seen him. Will send to [facility] to see if they can get him in sooner than January to see Dr. Leonie Man. Thank you.

## 2022-08-17 ENCOUNTER — Inpatient Hospital Stay: Payer: Medicare (Managed Care) | Attending: Physician Assistant

## 2022-08-17 ENCOUNTER — Inpatient Hospital Stay (HOSPITAL_BASED_OUTPATIENT_CLINIC_OR_DEPARTMENT_OTHER): Payer: Medicare (Managed Care) | Admitting: Physician Assistant

## 2022-08-17 ENCOUNTER — Inpatient Hospital Stay: Payer: Medicare (Managed Care)

## 2022-08-17 VITALS — BP 114/74 | HR 79 | Temp 98.1°F | Resp 16 | Wt 223.3 lb

## 2022-08-17 DIAGNOSIS — C7B8 Other secondary neuroendocrine tumors: Secondary | ICD-10-CM | POA: Diagnosis not present

## 2022-08-17 DIAGNOSIS — C7A8 Other malignant neuroendocrine tumors: Secondary | ICD-10-CM | POA: Insufficient documentation

## 2022-08-17 DIAGNOSIS — D3A8 Other benign neuroendocrine tumors: Secondary | ICD-10-CM

## 2022-08-17 DIAGNOSIS — K6389 Other specified diseases of intestine: Secondary | ICD-10-CM

## 2022-08-17 DIAGNOSIS — Z79899 Other long term (current) drug therapy: Secondary | ICD-10-CM | POA: Insufficient documentation

## 2022-08-17 LAB — CMP (CANCER CENTER ONLY)
ALT: 6 U/L (ref 0–44)
AST: 22 U/L (ref 15–41)
Albumin: 3.8 g/dL (ref 3.5–5.0)
Alkaline Phosphatase: 85 U/L (ref 38–126)
Anion gap: 8 (ref 5–15)
BUN: 29 mg/dL — ABNORMAL HIGH (ref 8–23)
CO2: 27 mmol/L (ref 22–32)
Calcium: 9.8 mg/dL (ref 8.9–10.3)
Chloride: 103 mmol/L (ref 98–111)
Creatinine: 1.01 mg/dL (ref 0.61–1.24)
GFR, Estimated: >60 mL/min (ref >60)
Glucose, Bld: 337 mg/dL — ABNORMAL HIGH (ref 70–99)
Potassium: 4.5 mmol/L (ref 3.5–5.1)
Sodium: 138 mmol/L (ref 135–145)
Total Bilirubin: 0.4 mg/dL (ref 0.3–1.2)
Total Protein: 7.3 g/dL (ref 6.5–8.1)

## 2022-08-17 LAB — CBC WITH DIFFERENTIAL (CANCER CENTER ONLY)
Abs Immature Granulocytes: 0.03 10*3/uL (ref 0.00–0.07)
Basophils Absolute: 0.1 10*3/uL (ref 0.0–0.1)
Basophils Relative: 1 %
Eosinophils Absolute: 0.3 10*3/uL (ref 0.0–0.5)
Eosinophils Relative: 3 %
HCT: 42 % (ref 39.0–52.0)
Hemoglobin: 13.9 g/dL (ref 13.0–17.0)
Immature Granulocytes: 0 %
Lymphocytes Relative: 20 %
Lymphs Abs: 1.7 10*3/uL (ref 0.7–4.0)
MCH: 29.1 pg (ref 26.0–34.0)
MCHC: 33.1 g/dL (ref 30.0–36.0)
MCV: 87.9 fL (ref 80.0–100.0)
Monocytes Absolute: 0.7 10*3/uL (ref 0.1–1.0)
Monocytes Relative: 8 %
Neutro Abs: 5.7 10*3/uL (ref 1.7–7.7)
Neutrophils Relative %: 68 %
Platelet Count: 255 10*3/uL (ref 150–400)
RBC: 4.78 MIL/uL (ref 4.22–5.81)
RDW: 14 % (ref 11.5–15.5)
WBC Count: 8.5 10*3/uL (ref 4.0–10.5)
nRBC: 0 % (ref 0.0–0.2)

## 2022-08-17 MED ORDER — OCTREOTIDE ACETATE 20 MG IM KIT
20.0000 mg | PACK | Freq: Once | INTRAMUSCULAR | Status: AC
  Administered 2022-08-17: 20 mg via INTRAMUSCULAR
  Filled 2022-08-17: qty 1

## 2022-08-17 NOTE — Progress Notes (Signed)
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   EXAM: CT ABDOMEN AND PELVIS WITH CONTRAST  IMPRESSION: 1. 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 2. Hepatic lesion as discussed above in the posterior RIGHT hemiliver is suspicious for metastasis in this context. For all above findings in lieu of hepatic MRI, if the patient is not able to undergo hepatic MRI, biochemical correlation and DOTATATE PET may be helpful. 3. Bowel loop in the central abdomen with nodular enhancement approximately 8-9 mm along the wall in terms of greatest wall thickness. 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 4. Hepatic steatosis. 5. Resolution of inflammation about the pancreatic tail with nodular appearance of pancreatic tail. Attention on subsequent imaging is suggested. This may reflect sequela of prior inflammation given improvement but underlying lesion in this location while  not favored is not excluded.  ADDENDUM: For clarification, the central, ileal mesenteric lesion currently measuring 2.1 x 2.1 x 2.4 cm measured 1.6 x 1.8 x 2.1 cm in 2015.   Faintly visible area in the posterior RIGHT hemiliver in 2015 raising the question of possibility of small lesion at best 2 x 2.2 cm in the posterior RIGHT hemiliver this areas now clearly visible measuring 4.1 x 4.0 cm. Findings are compatible with indolent behavior. Indolent carcinoid remains the primary differential consideration. Tumor marker correlation is suggested with DOTATATE PET for further evaluation as warranted.   06/23/2022 Imaging   EXAM: MRI ABDOMEN WITHOUT AND WITH CONTRAST  IMPRESSION: 3.9 cm heterogeneous enhancing mass in segment 7 of the right hepatic lobe, which is nonspecific. Hepatic metastasis cannot be excluded.   Small benign hemangioma and cysts in the posterior right hepatic lobe.   Small right lower quadrant mesenteric mass or lymphadenopathy, without significant change.   Stable 2.1 cm cystic lesion in the pancreatic neck, with other tiny sub-cm cystic foci in the pancreatic head and body. Differential diagnosis includes pancreatic pseudocysts and indolent IPMNs. Recommend continued follow-up by MRI in 6 months. This recommendation follows ACR consensus guidelines: Management of Incidental Pancreatic Cysts: A White Paper of the ACR Incidental Findings Committee. J Am Coll Radiol 4540;98:119-147.   7 mm calculus in the left renal pelvis, with mild left pelvicaliectasis.   07/14/2022 Initial Biopsy   FINAL MICROSCOPIC DIAGNOSIS:   A. LIVER, RIGHT, NEEDLE CORE BIOPSY:  -  Metastatic low-grade neuroendocrine tumor (positive for chromogranin and synaptophysin).  -  Proliferation rate by Ki-67 less than 3%.    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 He was last seen by Dr. Burr Medico on 07/23/22. He presents to the clinic accompanied by his sister.  Mr. Karstens reports his energy levels are fairly stable since last visit.  He denies any appetite changes or weight loss.   He has intermittent episodes of constipation versus diarrhea.  He currently takes Colace as needed.  He denies easy bruising or signs of bleeding.  He has abdominal pain that is well controlled with his current pain medication.  He denies nausea, vomiting, fevers, chills, night sweats, shortness of breath, chest pain, palpitations or flushing episodes.  He has no other complaints.  All other systems were reviewed with the patient and are negative.  MEDICAL HISTORY:  Past Medical History:  Diagnosis Date   Arthritis    R shoulder, bone spur   Arthritis    "hands" (03/25/2016)   Chronic diastolic heart failure (HCC)    Chronic lower back pain    Coronary heart disease    Dr Pernell Dupre   Diverticulitis 12/2021   DVT (deep venous thrombosis) ([place]) 05/2022   left leg   H/O cardiovascular stress test    perhaps last one was 2009   Heart murmur    12/29/11 echo: mild MR, no AS, trivial TR   History of gout    Hyperlipidemia    Hypertension    saw Dr. Linard Millers last earl;y- 2014, cardiac cath. last done ?2009, blocks seen didn't require any intervention at that point.   Narcolepsy    [name] 04-11-97; Mean Latency 1.3mn, SOREM 2   OSA on CPAP    NPSG 12-13-98 AHI 22.7   PONV (postoperative nausea and vomiting)    Presence of permanent cardiac pacemaker    Shortness of breath    with exertion    Stroke ([facility]    Type II diabetes mellitus ([facility]     SURGICAL HISTORY: Past Surgical History:  Procedure Laterality Date   CARDIAC CATHETERIZATION  2009?   EP IMPLANTABLE DEVICE N/A 03/25/2016   Procedure: Pacemaker Implant - Dual  Chamber;  Surgeon: GEvans Lance MD;  [facility];  Service: Cardiovascular;  Laterality: N/A;   FRACTURE SURGERY     INSERT / REPLACE / REMOVE PACEMAKER  03/24/2016   INTRAMEDULLARY (IM) NAIL INTERTROCHANTERIC Left 02/08/2022   Procedure: INTRAMEDULLARY (IM) NAIL INTERTROCHANTRIC;  Surgeon: DMeredith Pel MD;  [facility]  Service: Orthopedics;  Laterality: Left;   IR KYPHO LUMBAR INC FX REDUCE BONE BX UNI/BIL CANNULATION INC/IMAGING  12/29/2021   IR RADIOLOGIST EVAL & MGMT  12/15/2021   IR RADIOLOGY PERIPHERAL GUIDED IV START  07/14/2022   IR [place] ACCESS RIGHT  07/15/2022   LEFT AND RIGHT HEART CATHETERIZATION WITH CORONARY ANGIOGRAM N/A 06/05/2014   Procedure: LEFT AND RIGHT HEART CATHETERIZATION WITH CORONARY ANGIOGRAM;  Surgeon: HSinclair Grooms MD;  [facility];  Service: Cardiovascular;  Laterality: N/A;   NASAL FRACTURE SURGERY     SHOULDER ARTHROSCOPY WITH ROTATOR CUFF REPAIR AND SUBACROMIAL DECOMPRESSION Right 08/16/2013   Procedure: SHOULDER ARTHROSCOPY WITH ROTATOR CUFF REPAIR AND SUBACROMIAL DECOMPRESSION;  Surgeon: JNita Sells MD;  [facility]  Service: Orthopedics;  Laterality: Right;  Right shoulder arthroscopy rotator cuff repair, subacromial decompression.   TONSILLECTOMY      I have reviewed the social history and family history with the patient and they are unchanged from previous note.  ALLERGIES:  is allergic to amantadines, clonazepam, and zoloft [sertraline].  MEDICATIONS:  Current Outpatient Medications  Medication Sig Dispense Refill   acetaminophen (TYLENOL) 500 MG tablet Take 500-1,000 mg by mouth every 6 (six) hours as needed for moderate pain or mild pain.     atorvastatin (LIPITOR) 40 MG tablet Take 0.5 tablets (20 mg total) by mouth daily. 30 tablet 0   bifidobacterium infantis (ALIGN) capsule Take 1 capsule by mouth daily.     Calcium Carb-Cholecalciferol (CALCIUM 600-D PO) Take by mouth in the  morning and at bedtime.     Camphor-Menthol-Methyl Sal (HM SALONPAS PAIN RELIEF EX) Apply 1 patch topically daily as needed (Back pain).     carbidopa-levodopa (SINEMET IR) 25-100 MG tablet TAKE 1 TABLET BY MOUTH IN THE MORNING AND IN THE EVENING 180 tablet 2   Carbidopa-Levodopa ER (SINEMET CR) 25-100 MG tablet controlled release TAKE 1 TABLET BY MOUTH EVERYDAY AT BEDTIME (Patient taking differently: Take 1 tablet by mouth at bedtime.) 90 tablet 1   cholecalciferol (VITAMIN D3) 25 MCG (1000 UNIT) tablet Take 1,000 Units by mouth daily.     clotrimazole-betamethasone (LOTRISONE) cream Apply 1 Application topically daily as needed (Rash).     docusate sodium (COLACE) 100 MG capsule Take 1 capsule (100 mg total) by mouth 2 (two) times daily. (Patient taking differently: Take 100 mg by mouth daily as needed for mild constipation or moderate constipation.) 10 capsule 0   feeding supplement, GLUCERNA SHAKE, (GLUCERNA SHAKE) LIQD Take 237 mLs by mouth 2 (two) times daily between meals. Alternate with ensure Diabetic     furosemide (LASIX) 20 MG tablet USE AS NEEDED FOR UP TO 5 DAYS IF SWELLING IN LEGS RECURS. CONTACT CARDIOLOGY IS SWELLING DOES NOT IMPROVE OR GETS WORSE. 15 tablet 0   lidocaine (LIDODERM) 5 % Place 1 patch onto the skin daily as needed. Purchase over the counter. On for 12 hours and off for 12 hours 30 patch 0   magnesium gluconate (MAGONATE) 500 MG tablet Take 0.5 tablets (250 mg total) by mouth at bedtime. 30 tablet 0   Melatonin 10 MG TABS Take 10 mg by mouth at bedtime.     metFORMIN (GLUCOPHAGE) 1000 MG tablet Take 1 tablet (1,000 mg total) by mouth daily with breakfast. (Patient taking differently: Take 500 mg by mouth 2 (two) times daily with a meal.) 30 tablet 0   metoprolol tartrate (LOPRESSOR) 25 MG tablet Take 1 tablet (25 mg total) by mouth 2 (two) times daily. (Patient taking differently: Take 12.5 mg by mouth 2 (two) times daily.) 60 tablet 0   modafinil (PROVIGIL) 100 MG  tablet 1 or 2 tabs each morning for alertness 30 tablet 0   Multiple Vitamin (MULTIVITAMIN WITH MINERALS) TABS tablet Take 1 tablet by mouth daily.     NITROSTAT 0.4 MG SL tablet PLACE 1 TABLET (0.4 MG TOTAL) UNDER THE TONGUE EVERY 5 (FIVE) MINUTES AS NEEDED FOR CHEST PAIN. (Patient taking differently: Place 0.4 mg under the tongue every 5 (five) minutes as needed for chest pain.) 25 tablet 5   Omega-3 Fatty Acids (OMEGA 3 PO) Take 1,280 mg by mouth daily.     polyethylene glycol (MIRALAX / GLYCOLAX) 17 g packet Take 17 g by mouth daily. 14 each 0   polyvinyl  alcohol (LIQUIFILM TEARS) 1.4 % ophthalmic solution Place 1 drop into both eyes as needed for dry eyes. (Patient taking differently: Place 1 drop into both eyes daily as needed for dry eyes.) 15 mL 0   QUEtiapine (SEROQUEL) 25 MG tablet Take 1 tablet (25 mg total) by mouth at bedtime. 30 tablet 5   Rivaroxaban (XARELTO) 15 MG TABS tablet Take 1 tablet (15 mg total) by mouth daily with supper. 90 tablet 1   Vitamin D, Ergocalciferol, (DRISDOL) 1.25 MG (50000 UNIT) CAPS capsule TAKE 1 CAPSULE (50,000 UNITS TOTAL) BY MOUTH EVERY 7 (SEVEN) DAYS 5 capsule 0   amantadine (SYMMETREL) 100 MG capsule Take 2 capsules (200 mg total) by mouth daily. (Patient not taking: Reported on 08/17/2022) 180 capsule 3   Wheat Dextrin (BENEFIBER PO) Take 10 mLs by mouth daily. (Patient not taking: Reported on 08/17/2022)     No current facility-administered medications for this visit.    PHYSICAL EXAMINATION: ECOG PERFORMANCE STATUS: 3 - Symptomatic, >50% confined to bed  Vitals:   08/17/22 1540  BP: 114/74  Pulse: 79  Resp: 16  Temp: 98.1 F (36.7 C)  SpO2: 96%   Wt Readings from Last 3 Encounters:  08/17/22 223 lb 4.8 oz (101.3 kg)  07/23/22 221 lb 12.8 oz (100.6 kg)  07/14/22 222 lb (100.7 kg)     GENERAL:alert, no distress and comfortable SKIN: skin color normal, no rashes or significant lesions EYES: normal, Conjunctiva are pink and  non-injected, sclera clear  NEURO: alert & oriented x 3 with fluent speech  LABORATORY DATA:  I have reviewed the data as listed    Latest Ref Rng & Units 08/17/2022    3:09 PM 07/14/2022    9:17 AM 07/09/2022    7:00 AM  CBC  WBC 4.0 - 10.5 K/uL 8.5  8.3  8.2   Hemoglobin 13.0 - 17.0 g/dL 13.9  13.2  13.4   Hematocrit 39.0 - 52.0 % 42.0  39.4  41.5   Platelets 150 - 400 K/uL 255  232  237         Latest Ref Rng & Units 08/17/2022    3:09 PM 06/21/2022    3:10 PM 04/30/2022    1:04 AM  CMP  Glucose 70 - 99 mg/dL 337  201  201   BUN 8 - 23 mg/dL 29  24  24    Creatinine 0.61 - 1.24 mg/dL 1.01  0.92  0.88   Sodium 135 - 145 mmol/L 138  137  137   Potassium 3.5 - 5.1 mmol/L 4.5  4.5  4.3   Chloride 98 - 111 mmol/L 103  105  105   CO2 22 - 32 mmol/L 27  24  19    Calcium 8.9 - 10.3 mg/dL 9.8  9.9  9.1   Total Protein 6.5 - 8.1 g/dL 7.3  7.1    Total Bilirubin 0.3 - 1.2 mg/dL 0.4  0.4    Alkaline Phos 38 - 126 U/L 85  69    AST 15 - 41 U/L 22  22    ALT 0 - 44 U/L 6  11        RADIOGRAPHIC STUDIES: I have personally reviewed the radiological images as listed and agreed with the findings in the report.  08/12/2022: PET DOTATATE:  IMPRESSION: 1. There is a tracer avid lesion within the posteromedial dome of right hepatic lobe compatible with metastatic neuroendocrine tumor. 2. There are 2 tracer avid nodules within the central mesentery  compatible with metastatic adenopathy. 3. Focal area of increased uptake localizing to a loop of small bowel within the central abdomen, which may reflect primary site of neuroendocrine tumor. 4. Equivocal uptake associated with small aortocaval lymph node. 5.  Aortic Atherosclerosis (ICD10-I70.0).    ASSESSMENT & PLAN:  Jordan [name] is a 81 y.o. male with   1. 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 CT AP on 06/17/22 showed: calcified mesenteric  mass with suspected associated small bowel mass; suspicious right hepatic lesion; nodular enhancement along wall of bowel loop in central abdomen.  -abdomen MRI 06/23/22 showed: 3.9 cm mass in right hepatic lobe; small RLQ mesenteric mass or lymphadenopathy. 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 -Patient was presented at GI tumor board on 07/28/2022 with recommendations to proceed with Sandostatin injections. -PET dotatate from 08/12/2022 showed avid disease within the liver, mesentery and small bowel. -Patient presents today, 08/17/2022, for follow-up and to start Sandostatin injections.  I reviewed the potential side effects, especially hypoglycemia, cholecystitis, pancreatitis, initial diarrhea and abdominal cramps, pressure, he agrees to proceed.  2.  Stroke, Parkinson disease, hypertension, diabetes -Continue medical treatment, follow-up with his primary care physician and neurologist.  3. DVT of LLE -Found in June 2023, related to his underlying malignancy and limited mobility -Recommend anticoagulation lifelong.  Recommend relative 15 mg daily due to history of intermittent hematuria.  PLAN: -Labs from today were reviewed and require no intervention.  No evidence of cytopenias.  LFTs and creatinine level normal. -Glucose level was elevated at 337.  Advise follow-up with PCP for follow-up for continued management of diabetes. -Reviewed PET dotatate scan from 08/12/2022. -Proceed with Sandostatin injection today -RTC in 4 weeks with labs, follow-up visit with Dr. Morey Hummingbird and Sandostatin injection.   All questions were answered. The patient knows to call the clinic with any problems, questions or concerns. No barriers to learning was detected.  I have spent a total of 30 minutes minutes of face-to-face and non-face-to-face time, preparing to see the Gilberts a medically appropriate examination, counseling and educating the patient,  documenting clinical information  in the electronic health record, independently interpreting results and communicating results to the patient, and care coordination.   Dede Query PA-C Dept of Hematology and [place] at [facility] Phone: [phone]

## 2022-08-18 ENCOUNTER — Encounter: Payer: Self-pay | Admitting: Physician Assistant

## 2022-08-18 ENCOUNTER — Telehealth: Payer: Self-pay

## 2022-08-18 ENCOUNTER — Telehealth: Payer: Self-pay | Admitting: Hematology

## 2022-08-18 LAB — CEA (IN HOUSE-CHCC): CEA (CHCC-In House): 2.96 ng/mL (ref 0.00–5.00)

## 2022-08-18 NOTE — Telephone Encounter (Signed)
Per 10/17 los called and left message for pt about appointment

## 2022-08-18 NOTE — Telephone Encounter (Signed)
can you reach his spouse to advise to follow up with his PCP.  His glucose was 337   Wife, [name], advised with VU and will call PCP today.

## 2022-08-19 ENCOUNTER — Other Ambulatory Visit (HOSPITAL_COMMUNITY): Payer: Self-pay

## 2022-08-19 ENCOUNTER — Ambulatory Visit (INDEPENDENT_AMBULATORY_CARE_PROVIDER_SITE_OTHER): Payer: Medicare (Managed Care) | Admitting: Neurology

## 2022-08-19 VITALS — BP 116/79 | HR 85

## 2022-08-19 DIAGNOSIS — G214 Vascular parkinsonism: Secondary | ICD-10-CM | POA: Diagnosis not present

## 2022-08-19 DIAGNOSIS — Z8673 Personal history of transient ischemic attack (TIA), and cerebral infarction without residual deficits: Secondary | ICD-10-CM | POA: Diagnosis not present

## 2022-08-19 DIAGNOSIS — R269 Unspecified abnormalities of gait and mobility: Secondary | ICD-10-CM | POA: Diagnosis not present

## 2022-08-19 LAB — CHROMOGRANIN A: Chromogranin A (ng/mL): 89.3 ng/mL (ref 0.0–101.8)

## 2022-08-19 MED ORDER — CARBIDOPA-LEVODOPA 25-100 MG PO TABS: 1.5000 | ORAL_TABLET | Freq: Three times a day (TID) | ORAL | 2 refills | Status: DC

## 2022-08-19 NOTE — Telephone Encounter (Signed)
Pharmacy, please advise on pt's Modafinil PA. Thanks.

## 2022-08-19 NOTE — Patient Instructions (Signed)
I had a long discussion with the patient and his wife regarding his vascular parkinsonism, parietal stroke and answered questions.  I recommend increasing Sinemet IR 1.5 tablets 3 times daily as tolerated gradually over the next 3 weeks current dose of Sinemet CR at bedtime.  I have discussed possible side effects with them and advised them to call me if needed.  Continue Xarelto for DVT and stroke prevention and maintain aggressive risk factor modification with strict control of diabetes with hemoglobin A1c goal below 6.5%, LDL cholesterol goal below 70 mg percent and hypertension with blood pressure goal below 130/90.  He was advised to use his walker at all times we also discussed fall afety precautions.Return for follow-up in the future in 3 months with [name] nurse patient will call earlier if needed. Fall Prevention in the Home, Adult Falls can cause injuries and can happen to people of all ages. There are many things you can do to make your home safe and to help prevent falls. Ask for help when making these changes. What actions can I take to prevent falls? General Instructions Use good lighting in all rooms. Replace any light bulbs that burn out. Turn on the lights in dark areas. Use night-lights. Keep items that you use often in easy-to-reach places. Lower the shelves around your home if needed. Set up your furniture so you have a clear path. Avoid moving your furniture around. Do not have throw rugs or other things on the floor that can make you trip. Avoid walking on wet floors. If any of your floors are uneven, fix them. Add color or contrast paint or tape to clearly mark and help you see: Grab bars or handrails. First and last steps of staircases. Where the edge of each step is. If you use a stepladder: Make sure that it is fully opened. Do not climb a closed stepladder. Make sure the sides of the stepladder are locked in place. Ask someone to hold the stepladder while you use  it. Know where your pets are when moving through your home. What can I do in the bathroom?     Keep the floor dry. Clean up any water on the floor right away. Remove soap buildup in the tub or shower. Use nonskid mats or decals on the floor of the tub or shower. Attach bath mats securely with double-sided, nonslip rug tape. If you need to sit down in the shower, use a plastic, nonslip stool. Install grab bars by the toilet and in the tub and shower. Do not use towel bars as grab bars. What can I do in the bedroom? Make sure that you have a light by your bed that is easy to reach. Do not use any sheets or blankets for your bed that hang to the floor. Have a firm chair with side arms that you can use for support when you get dressed. What can I do in the kitchen? Clean up any spills right away. If you need to reach something above you, use a step stool with a grab bar. Keep electrical cords out of the way. Do not use floor polish or wax that makes floors slippery. What can I do with my stairs? Do not leave any items on the stairs. Make sure that you have a light switch at the top and the bottom of the stairs. Make sure that there are handrails on both sides of the stairs. Fix handrails that are broken or loose. Install nonslip stair treads on all  your stairs. Avoid having throw rugs at the top or bottom of the stairs. Choose a carpet that does not hide the edge of the steps on the stairs. Check carpeting to make sure that it is firmly attached to the stairs. Fix carpet that is loose or worn. What can I do on the outside of my home? Use bright outdoor lighting. Fix the edges of walkways and driveways and fix any cracks. Remove anything that might make you trip as you walk through a door, such as a raised step or threshold. Trim any bushes or trees on paths to your home. Check to see if handrails are loose or broken and that both sides of all steps have handrails. Install guardrails  along the edges of any raised decks and porches. Clear paths of anything that can make you trip, such as tools or rocks. Have leaves, snow, or ice cleared regularly. Use sand or salt on paths during winter. Clean up any spills in your garage right away. This includes grease or oil spills. What other actions can I take? Wear shoes that: Have a low heel. Do not wear high heels. Have rubber bottoms. Feel good on your feet and fit well. Are closed at the toe. Do not wear open-toe sandals. Use tools that help you move around if needed. These include: Canes. Walkers. Scooters. Crutches. Review your medicines with your doctor. Some medicines can make you feel dizzy. This can increase your chance of falling. Ask your doctor what else you can do to help prevent falls. Where to find more information Centers for Disease Control and Prevention, [employer]: http://www.wolf.info/ National Institute on Aging: http://kim-miller.com/ Contact a doctor if: You are afraid of falling at home. You feel weak, drowsy, or dizzy at home. You fall at home. Summary There are many simple things that you can do to make your home safe and to help prevent falls. Ways to make your home safe include removing things that can make you trip and installing grab bars in the bathroom. Ask for help when making these changes in your home. This information is not intended to replace advice given to you by your health care provider. Make sure you discuss any questions you have with your health care provider. Document Revised: 07/20/2021 Document Reviewed: 05/21/2020 Elsevier Patient Education  [place].

## 2022-08-19 NOTE — Progress Notes (Signed)
[facility] [address] [place]. [place] [address] [phone]       OFFICE FOLLOW UP NOTE  Jordan [name] Date of Birth:  1941/04/20 Medical Record Number:  [id number]   Primary neurologist: Dr. 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  He is accompanied by his wife.  She states that for the last week or so she has noticed worsening of his parkinsonian symptoms.  He appears extremely fatigued and weak.  He has trouble walking down the hallway from the bedroom to the living room and has to stop.  At times he has difficult time getting started with any walks.  Walks with increased shuffling and small steps.  He has mild intermittent tremor but it is not as bothersome.  Patient had been on amantadine 200 mg daily but he was having some hallucinations so this was discontinued a week or 2 ago and may have contributed to his worsening.  He is currently on Sinemet IR 25/101 tablet 3 times daily and Sinemet CR 25/101 tablet at bedtime.  He seems to be tolerating these medications fairly well without significant dizziness, sleepiness or other side effects.  He has had no recent stroke or TIA symptoms.  He remains on Xarelto for DVT treatment. Update 07/13/2022 JM: Patient returns for follow up visit accompanied by his wife  He has been relatively stable from neurological standpoint over the past few months.  He has remained on Sinemet 1 tab 3 times daily and Sinemet CR 1 tab nightly. Tolerating without side effects. Does have some worsening stiffness and slowness pursing in the morning and in the evening/bedtime. Can have occasional difficulty with right leg hesitancy/coordination especially in the evening. Use of RW for short distance ambulation  otherwise use of w/c.  Recently completed outpatient therapies.  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  He does try to maintain some ADLs independently but does need some assistance by his wife.  He does fatigue quickly after some ADLs such as bathing or dressing.  Previously having issues with visual hallucinations but these have greatly subsided since starting Seroquel 12.5 mg nightly, does still have some occasional visual hallucinations but nonthreatening.  Does have frequent talking and acting out (punching and kicking) while sleeping.  He is on melatonin 10 mg nightly.  Admits to limited daytime physical activity, he usually sits and watches TV during the day, can fall asleep easily in chair unless watching something that keeps his interest. 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 Annamaria Boots back in March.  He does endorse nightly use of CPAP.   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  He was discharged to SNF for deconditioning.  He underwent kyphoplasty on 2/28 for lumbar compression fracture by Dr. Estanislado Pandy  hospitalized again 3/1 for generalized abdominal pain and frequent falls, discharged to CIR on 3/10. During CIR stay, he was started on amantadine to help with activation and remained on low-dose Seroquel to help with sleep and  decrease nighttime hallucinations.  Suffered a closed left hip fracture post fall 01/2022 and underwent surgical repair.  He was discharged to SNF rehab and returned home on 6/8.  Diagnosed with left common femoral vein, SF junction, femoral vein, proximal profunda vein, popliteal vein and posterior tibial vein DVT on 6/28. Placed on Xarelto with outpatient  vascular surgery follow-up. Had issues with hematuria, improved after lowering dose to therapeutic dose but still having issues.  Recently transition to Lovenox for upcoming liver biopsy, wife reports resolution of discolored urine since stopping Xarelto.  Plans on pursuing cystoscopy in the near future.       History provided for reference purposes only Update 12/08/2021 JM: Returns for follow-up visit after prior visit 3 months ago for concern of worsening gait and functioning. Hx of prior stroke and parkinsonism. At prior visit, adjusted Sinemet dosage - currently on 25-100 1 tab 3x daily and ER 25-100 nightly. Per patient and wife, gait has remained the same - denies worsening but also denies any improvement. Tolerating Sinemet without side effects but no noticeable difference since dose change 3 months ago. Recently seen by Dr. Nelva Bush for lower back issues and plans to start PT for primarily lumbar spine on 3/2. Continues to use RW but limited ambulate and only short distance. Does c/o neck and shoulder pain which is chronic but gradually worsening. Limited neck ROM. Denies radiculopathy. No prior cervical imaging. At times, will have difficulty holding head up and sits in a hunched over position. He will have difficulty holding on to small, especially lighter objects, right > left since stroke. C/o shortness of breath after talking for prolonged periods of time which has been present over the past 4-5 months. Denies SOB on exertion but wife believes that he does. Wife also notes voice being softer. Denies any swallowing difficulties. Does have chronic diabetic neuropathy with some worsening over the past 6 months. Recent A1c satisfactory. BUE tremors stable. He also c/o continued fatigue.   Stable from stroke standpoint without new stroke/TIA symptoms. Compliant on Plavix and atorvastatin without side effects.  Blood pressure today 112/75. Recent lipid panel satisfactory.   No further concerns at this  time   Update 09/14/2021 JM: Returns for sooner scheduled visit accompanied by his wife.  Concern regarding worsening gait and functioning since prior visit. He did have a fall on 8/23 with subsequent left acetabular fracture - ortho eval recommended nonoperative management.  Admitted to SNF rehab and discharged on 9/23. Reports since he returned home from rehab, gait has gradually worsened (increased shuffling and decreased step height) and has been requiring more assistance.  Working with [facility] PT initially 2x weekly now only 1x per week. Suffered a fall 10/29. Eval at [facility] walk-in clinic with xray showing possible compression fracture per wife report (unable to personally view via epic).  He is currently awaiting eval by orthopedics. He has had f/u with ortho for hip fx since returning home and f/u visit scheduled end of this month. Per wife, currently limited ambulation and exercise - will only walk to go to the bathroom. PT currently on hold until further eval with our office and ortho.   Wife apparently increased Sinemet to 3 times daily (currently taking at 10am, 4pm and 10pm) approx 1 month ago. Per wife, slight improvement of gait and functioning since increase during the day but does have worsening gait and stiffness upon awakening. Follows with Dr. Annamaria Boots for CPAP and narcolepsy and REM sleep behavior disorder-started on Klonopin 0.'5mg'$   nightly 10/25 which has helped. Also reports hallucinations during SNF rehab stay - this has since improved.  Wife questions if Sinemet dosage needs to be adjusted  Eval by Dr. Rexene Alberts for second opinion of gait disorder and wife concern of Parkinson's disease. 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    Update 05/12/2021 Dr. Leonie Man; He returns for follow-up after last visit with [name] nurse practitioner in April 2022.  Is accompanied by his  wife.  Patient has had no recurrent stroke or TIA symptoms.  He continues to have mild dizziness and imbalance in the right side paresthesias and diminished fine motor skills.  He occasionally has tingling of his fingers or his feet but these are not bothersome.  He is quite active and works out 3 days a week at [employer].  He remains on Plavix which is tolerating well without bruising or bleeding.  He takes Lipitor 20 mg daily and is tolerating it well.  He states his primary care physician did check his lipid profile and A1c about 5 weeks ago but I do not have those results and he was told they were satisfactory.  He does have chronic low back pain and also complains of fatigue and tiredness.  He has not discussed this with primary physician had any work-up done for this.  Patient has history of parkinsonian tremor and has a prescription for Sinemet 25/103 times daily but states that he gets it only once a day or so as he forgets to take it and other times.  Tremor seems intermittent and not constant and is not functionally disabling.  Wife feels that he does have a shuffling gait particularly when he is tired.  He however denies significant bradykinesia and is able to get up and walk by himself easily without assistance.  He denies drooling of saliva or micrographia.  He has not had any particular worsening of his tremors or other parkinsonian symptoms in recent months.    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 Per wife, over the past 3 to 4 days, he has been experiencing increased daytime sleepiness where he is sleeping majority of the day as well as sleeping well at night.  Reports PCP recently changed Adderall IR to XR for narcolepsy but will occasionally miss a dose.  Also reports recently being started on sertraline 50 mg daily on 4/14 which he will take in the  morning.  He also recently started PT on Monday for gait impairment and BPPV.  No other associated symptoms such as confusion, worsening cognition, weakness, numbness/tingling, speech deficit, visual changes or gait changes.  No further concerns at this time   Update 11/27/2020 JM: Jordan [name] returns per patient request for sooner scheduled visit due to continued gait impairment and lower back pain.  He is currently unaccompanied.  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  Tremors have been stable without worsening.  He does feel as though his ambulation is worse with increased back pain. He did have a mechanical fall seen in ED on 11/05/2020 thankfully without injury.   He does have chronic back pain previously doing routine exercise prior to his stroke but has not returned back to routine exercise since.  He admits to sedentary life style with limited physical activity. He will fall asleep easily during the day watching TV and occasional issues with insomnia. He will take adderral '10mg'$  daily for narcolepsy and will take additional tablet as needed during the day.  No further concerns at this time.  Update 09/15/2020 JM: Jordan [name] returns for stroke follow-up accompanied by his wife.  He has been doing well since prior visit with residual decreased right hand dexterity and decreased sensory right fingertips.  Denies new or worsening stroke/TIA symptoms. He does complain of short-term memory loss such as forgetting an actors name or random fact.  He also continues to experience R>L upper extremity tremors and gait impairment with imbalance with fall in 06/2020 resulting in nasal bone fracture.  Wife reports shuffling type gait when fatigued.  Remains on Plavix and atorvastatin for secondary stroke prevention without side effects.  Blood pressure today 126/73.  Glucose level stable per patient.  Reports ongoing compliance with CPAP for OSA management  managed by pulmonology Dr. Annamaria Boots.  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  He complains of excessive daytime fatigue, vivid dreams and occasional insomnia. Per patient, he has not trialed any other medication for narcolepsy.  No further concerns at this time.  Update 05/15/2020 JM: Jordan [name] is being seen for hospital follow-up accompanied by his wife.  He was discharged home from CIR on 04/29/2020 after 11 day stay.  Residual deficits of mild imbalance and mildly decreased sensation R finger tips and decreased right hand fine motor skills. Does report ongoing improvement currently working with home health PT. he has had gait impairment ongoing and right hand decreased fine motor control with tremors and was evaluated by Dr. Rexene Alberts on 12/11/2019 for possible Parkinson's but was not felt this was Parkinson's related.  He does continue to use cane for ambulation and denies any recent falls.  He is questioning return to driving as he was advised no driving at discharge until further cleared.  Completed 3 weeks DAPT and continues on Plavix alone without bleeding or bruising.  Continues on atorvastatin 40 mg daily without myalgias.  Blood pressure today 118/84.  Glucose levels stable.  Endorses ongoing use of CPAP for OSA management.  No further concerns at this time.   Stroke admission 04/14/2020 Jordan [name] is a 81 y.o. male with history of  diabetes, stroke, hypertension, hyperlipidemia, permanent cardiac pacemaker  who presented on 04/14/2020 with R sided decreased sensation.  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  Previously on clopidogrel and recommended DAPT for 3 weeks then clopidogrel alone.  Presented in hypertensive urgency with BP 204/116 and recommended long-term BP goal normotensive range.  LDL 57 and recommended continuation of atorvastatin 40 mg daily.  Uncontrolled DM with A1c 7.9.  Other stroke risk  factors include prior stroke 01/2018 with multifocal punctate infarcts concerning for embolic pattern, former tobacco use, EtOH use, obesity, CAD, OSA on CPAP and permanent cardiac pacemaker.  Evaluated by therapies who recommended CIR for residual deficits of imbalance and safety affecting ADLs and mobility.  Discharged to CIR on 04/18/2020.  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 Small vessel disease. Sinus dz. ASPECTS 10.    CTA head & neck no LVO. Aortic atherosclerosis.  MRI  L thalamic infarct. Small vessel disease. Atrophy.  2D Echo EF 65-70%. No source  of embolus. LA mildly dilated.  Pacemaker interrogation no afib LDL 57 -continued atorvastatin 40 mg daily HgbA1c 7.9 Lovenox 40 mg sq daily for VTE prophylaxis clopidogrel 75 mg daily prior to admission, now on aspirin 81 mg daily and clopidogrel 75 mg daily. Continue DAPT x 3 weeks then plavix alone   Therapy recommendations:  CIR Disposition:  CIR      ROS:   14 system review of systems performed and negative with exception of those listed in HPI  PMH:  Past Medical History:  Diagnosis Date   Arthritis    R shoulder, bone spur   Arthritis    "hands" (03/25/2016)   Chronic diastolic heart failure (HCC)    Chronic lower back pain    Coronary heart disease    Dr Pernell Dupre   Diverticulitis 12/2021   DVT (deep venous thrombosis) ([place]) 05/2022   left leg   H/O cardiovascular stress test    perhaps last one was 2009   Heart murmur    12/29/11 echo: mild MR, no AS, trivial TR   History of gout    Hyperlipidemia    Hypertension    saw Dr. Linard Millers last earl;y- 2014, cardiac cath. last done ?2009, blocks seen didn't require any intervention at that point.   Narcolepsy    [name] 04-11-97; Mean Latency 1.47mn, SOREM 2   OSA on CPAP    NPSG 12-13-98 AHI 22.7   PONV (postoperative nausea and vomiting)    Presence of permanent cardiac pacemaker    Shortness of  breath    with exertion    [place]    Type II diabetes mellitus ([facility]     PSH:  Past Surgical History:  Procedure Laterality Date   CARDIAC CATHETERIZATION  2009?   EP IMPLANTABLE DEVICE N/A 03/25/2016   Procedure: Pacemaker Implant - Dual Chamber;  Surgeon: GEvans Lance MD;  [facility];  Service: Cardiovascular;  Laterality: N/A;   FRACTURE SURGERY     INSERT / REPLACE / REMOVE PACEMAKER  03/24/2016   INTRAMEDULLARY (IM) NAIL INTERTROCHANTERIC Left 02/08/2022   Procedure: INTRAMEDULLARY (IM) NAIL INTERTROCHANTRIC;  Surgeon: DMeredith Pel MD;  [facility]: [facility]  Service: Orthopedics;  Laterality: Left;   IR KYPHO LUMBAR INC FX REDUCE BONE BX UNI/BIL CANNULATION INC/IMAGING  12/29/2021   IR RADIOLOGIST EVAL & MGMT  12/15/2021   IR RADIOLOGY PERIPHERAL GUIDED IV START  07/14/2022   IR [place]ASC ACCESS RIGHT  07/15/2022   LEFT AND RIGHT HEART CATHETERIZATION WITH CORONARY ANGIOGRAM N/A 06/05/2014   Procedure: LEFT AND RIGHT HEART CATHETERIZATION WITH CORONARY ANGIOGRAM;  Surgeon: HSinclair Grooms MD;  [facility];  Service: Cardiovascular;  Laterality: N/A;   NASAL FRACTURE SURGERY     SHOULDER ARTHROSCOPY WITH ROTATOR CUFF REPAIR AND SUBACROMIAL DECOMPRESSION Right 08/16/2013   Procedure: SHOULDER ARTHROSCOPY WITH ROTATOR CUFF REPAIR AND SUBACROMIAL DECOMPRESSION;  Surgeon: JNita Sells MD;  [facility]  Service: Orthopedics;  Laterality: Right;  Right shoulder arthroscopy rotator cuff repair, subacromial decompression.   TONSILLECTOMY      Social History:  Social History   Socioeconomic History   Marital status: Married    Spouse name: KClaiborne Billings  Number of children: Not on file   Years of education: Not on file   Highest education level: Not on file  Occupational History   Occupation: [employer]  Occupation: [employer]  Tobacco Use   Smoking status: Former  Packs/day: 2.50    Years: 6.00    Total pack  years: 15.00    Types: Cigarettes    Quit date: 06/05/1967    Years since quitting: 55.2   Smokeless tobacco: Never  Vaping Use   Vaping Use: Never used  Substance and Sexual Activity   Alcohol use: Yes    Comment: rare   Drug use: No   Sexual activity: Not Currently  Other Topics Concern   Not on file  Social History Narrative   Lives with wife and son   Right handed   Drinks 1 cups caffeine daily   Social Determinants of Health   Financial Resource Strain: Not on file  Food Insecurity: Not on file  Transportation Needs: Not on file  Physical Activity: Not on file  Stress: Not on file  Social Connections: Not on file  Intimate Partner Violence: Not on file    Family History:  Family History  Problem Relation Age of Onset   Sleep apnea Sister    Colon cancer Sister     Medications:   Current Outpatient Medications on File Prior to Visit  Medication Sig Dispense Refill   acetaminophen (TYLENOL) 500 MG tablet Take 500-1,000 mg by mouth every 6 (six) hours as needed for moderate pain or mild pain.     atorvastatin (LIPITOR) 40 MG tablet Take 0.5 tablets (20 mg total) by mouth daily. 30 tablet 0   bifidobacterium infantis (ALIGN) capsule Take 1 capsule by mouth daily.     Calcium Carb-Cholecalciferol (CALCIUM 600-D PO) Take by mouth in the morning and at bedtime.     Camphor-Menthol-Methyl Sal (HM SALONPAS PAIN RELIEF EX) Apply 1 patch topically daily as needed (Back pain).     Carbidopa-Levodopa ER (SINEMET CR) 25-100 MG tablet controlled release TAKE 1 TABLET BY MOUTH EVERYDAY AT BEDTIME (Patient taking differently: Take 1 tablet by mouth at bedtime.) 90 tablet 1   cholecalciferol (VITAMIN D3) 25 MCG (1000 UNIT) tablet Take 1,000 Units by mouth daily.     clotrimazole-betamethasone (LOTRISONE) cream Apply 1 Application topically daily as needed (Rash).     docusate sodium (COLACE) 100 MG capsule Take 1 capsule (100 mg total) by mouth 2 (two) times daily. (Patient taking  differently: Take 100 mg by mouth daily as needed for mild constipation or moderate constipation.) 10 capsule 0   feeding supplement, GLUCERNA SHAKE, (GLUCERNA SHAKE) LIQD Take 237 mLs by mouth 2 (two) times daily between meals. Alternate with ensure Diabetic     furosemide (LASIX) 20 MG tablet USE AS NEEDED FOR UP TO 5 DAYS IF SWELLING IN LEGS RECURS. CONTACT CARDIOLOGY IS SWELLING DOES NOT IMPROVE OR GETS WORSE. 15 tablet 0   lidocaine (LIDODERM) 5 % Place 1 patch onto the skin daily as needed. Purchase over the counter. On for 12 hours and off for 12 hours 30 patch 0   magnesium gluconate (MAGONATE) 500 MG tablet Take 0.5 tablets (250 mg total) by mouth at bedtime. 30 tablet 0   Melatonin 10 MG TABS Take 10 mg by mouth at bedtime.     metFORMIN (GLUCOPHAGE) 1000 MG tablet Take 1 tablet (1,000 mg total) by mouth daily with breakfast. (Patient taking differently: Take 500 mg by mouth 2 (two) times daily with a meal.) 30 tablet 0   metoprolol tartrate (LOPRESSOR) 25 MG tablet Take 1 tablet (25 mg total) by mouth 2 (two) times daily. (Patient taking differently: Take 12.5 mg by mouth 2 (two) times daily.) 60 tablet 0  modafinil (PROVIGIL) 100 MG tablet 1 or 2 tabs each morning for alertness 30 tablet 0   Multiple Vitamin (MULTIVITAMIN WITH MINERALS) TABS tablet Take 1 tablet by mouth daily.     NITROSTAT 0.4 MG SL tablet PLACE 1 TABLET (0.4 MG TOTAL) UNDER THE TONGUE EVERY 5 (FIVE) MINUTES AS NEEDED FOR CHEST PAIN. (Patient taking differently: Place 0.4 mg under the tongue every 5 (five) minutes as needed for chest pain.) 25 tablet 5   Omega-3 Fatty Acids (OMEGA 3 PO) Take 1,280 mg by mouth daily.     polyethylene glycol (MIRALAX / GLYCOLAX) 17 g packet Take 17 g by mouth daily. 14 each 0   polyvinyl alcohol (LIQUIFILM TEARS) 1.4 % ophthalmic solution Place 1 drop into both eyes as needed for dry eyes. (Patient taking differently: Place 1 drop into both eyes daily as needed for dry eyes.) 15 mL 0    QUEtiapine (SEROQUEL) 25 MG tablet Take 1 tablet (25 mg total) by mouth at bedtime. 30 tablet 5   Rivaroxaban (XARELTO) 15 MG TABS tablet Take 1 tablet (15 mg total) by mouth daily with supper. 90 tablet 1   Vitamin D, Ergocalciferol, (DRISDOL) 1.25 MG (50000 UNIT) CAPS capsule TAKE 1 CAPSULE (50,000 UNITS TOTAL) BY MOUTH EVERY 7 (SEVEN) DAYS 5 capsule 0   No current facility-administered medications on file prior to visit.    Allergies:   Allergies  Allergen Reactions   Amantadines Other (See Comments)    Mental changes   Clonazepam Other (See Comments)    Mental changes   Zoloft [Sertraline] Other (See Comments)    Mental changes      OBJECTIVE:  Physical Exam  Vitals:   08/19/22 1534  BP: 116/79  Pulse: 85    There is no height or weight on file to calculate BMI. No results found.  General: Obese , very pleasant elderly Caucasian male, seated, in no evident distress Head: head normocephalic and atraumatic.   Neck: supple with no carotid or supraclavicular bruits Cardiovascular: regular rate and rhythm, no murmurs Musculoskeletal: limited cervical and b/l shoulder ROM Skin:  no rash/petichiae Vascular:  Normal pulses all extremities   Neurologic Exam Mental Status: Awake and fully alert.  Moderately diminished facial expression.  Mild to moderate hypophonia.  Unable to appreciate dysarthria or aphasia.  Oriented to place and time. Recent and remote memory intact. Attention span, concentration and fund of knowledge appropriate. Mood and affect appropriate.  Decreased facial expression.  Positive glabellar tap. Cranial Nerves: Pupils equal, briskly reactive to light. Extraocular movements full without nystagmus. Visual fields full to confrontation. Hearing intact. Facial sensation intact. Face, tongue, palate moves normally and symmetrically.  Motor: Normal bulk and tone. Normal strength in all tested extremity muscles except mild bilateral hip flexor weakness.  Diminished  fine finger movements on the right and orbits left over right upper extremity.  Mild intermittent action tremor right upper extremity.  Tremor absent at rest.  Cogwheel rigidity upon activation only right greater than left hand. Sensory.: intact to touch , pinprick , position and vibratory sensation.  Coordination: Rapid alternating movements normal in all extremities except slightly decreased right hand. Finger-to-nose and heel-to-shin performed accurately bilaterally.   Mild B/l R>L UE postural tremor with mild R cogwheel rigidity. Mild R>L bradykinesia.   Gait and Station: [name]  Gets up from the bed with mild assistance.  Uses a walker as stiffness and dragging of the left leg with mild apraxia while turning.  No shuffling or festination  but stooped posture. Reflexes: 1+ and symmetric. Toes downgoing.        ASSESSMENT/PLAN: TAIJON [name] is a 81 y.o. 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  S/p left acetabular fracture 06/23/2021 after a mechanical fall with worsening gait and increased need for assistance since return home from SNF rehab and recurrent fall with resultant left hip fracture 01/2022 requiring surgical procedure.  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  He also has vascular parkinsonism which has shown some response to Sinemet But recent worsening may have been due to discontinuing amantadine.   I had a long discussion with the patient and his wife regarding his vascular parkinsonism, parietal stroke and answered questions.  I recommend increasing Sinemet IR 1.5 tablets 3 times daily as tolerated gradually over the next 3 weeks current dose of Sinemet CR at bedtime.  I have discussed possible side effects with them and advised them to call me if needed.   Continue Xarelto for DVT and stroke prevention and maintain aggressive risk factor modification with strict control of diabetes with hemoglobin A1c goal below 6.5%, LDL cholesterol goal below 70 mg percent and hypertension with blood pressure goal below 130/90.  He was advised to use his walker at all times we also discussed fall afety precautions.Return for follow-up in the future in 3 months with [name] nurse patient will call earlier if needed.  I spent a prolonged 58 minutes of face-to-face and non-face-to-face time with patient and wife.  This included previsit chart review including review of multiple hospitalizations, lab review, study review, order entry, electronic health record documentation, patient and wife education and prolonged discussion regarding above diagnoses and treatment plan and answered all the questions to patient and wife satisfaction  Frann Rider, AGNP-BC  [facility] [address] [place], [place] [address]  Phone [phone] Fax [phone] Note: This document was prepared with digital dictation and possible smart phrase technology. Any transcriptional errors that result from this process are unintentional.

## 2022-08-21 DIAGNOSIS — G4733 Obstructive sleep apnea (adult) (pediatric): Secondary | ICD-10-CM | POA: Diagnosis not present

## 2022-08-23 ENCOUNTER — Telehealth: Payer: Self-pay | Admitting: Pharmacy Technician

## 2022-08-23 ENCOUNTER — Other Ambulatory Visit (HOSPITAL_COMMUNITY): Payer: Self-pay

## 2022-08-23 NOTE — Telephone Encounter (Signed)
Patient Advocate Encounter   Received notification that prior authorization for Modafinil '100mg'$  is required/requested.  Previously requested through CoverMyMeds, but clinical questions didn't populate due to a previous denial. Called [employer] to find out when it was denied & what the requirements were for approval. [phone] denied 06/08/22. Did not meet requirements.   PA submitted on 08/23/22 over the phone. Pt has qualifying dx and it's being prescribed by a qualifying physician.   Prior Authorization for has been Approved.  (30 tabs for 30 day supply)  PA# ID: [id number] Effective dates: 07/24/22 through 08/23/23  Patient's copay is $20.33.  Spoke with Pharmacy to process.

## 2022-08-24 ENCOUNTER — Other Ambulatory Visit: Payer: Self-pay | Admitting: Physical Medicine and Rehabilitation

## 2022-08-31 NOTE — Telephone Encounter (Signed)
See pt email from 08/01/22.

## 2022-09-15 ENCOUNTER — Ambulatory Visit: Payer: Medicare (Managed Care)

## 2022-09-15 ENCOUNTER — Other Ambulatory Visit: Payer: Medicare (Managed Care)

## 2022-09-15 ENCOUNTER — Ambulatory Visit: Payer: Medicare (Managed Care) | Admitting: Hematology

## 2022-09-15 DIAGNOSIS — M9903 Segmental and somatic dysfunction of lumbar region: Secondary | ICD-10-CM | POA: Diagnosis not present

## 2022-09-15 DIAGNOSIS — M9905 Segmental and somatic dysfunction of pelvic region: Secondary | ICD-10-CM | POA: Diagnosis not present

## 2022-09-15 DIAGNOSIS — M5136 Other intervertebral disc degeneration, lumbar region: Secondary | ICD-10-CM | POA: Diagnosis not present

## 2022-09-15 DIAGNOSIS — M9904 Segmental and somatic dysfunction of sacral region: Secondary | ICD-10-CM | POA: Diagnosis not present

## 2022-09-16 ENCOUNTER — Emergency Department (HOSPITAL_COMMUNITY): Payer: Medicare (Managed Care)

## 2022-09-16 ENCOUNTER — Emergency Department (HOSPITAL_COMMUNITY)
Admission: EM | Admit: 2022-09-16 | Discharge: 2022-09-16 | Disposition: A | Discharge status: Home / Self Care | Payer: Medicare (Managed Care) | Attending: Emergency Medicine | Admitting: Emergency Medicine

## 2022-09-16 ENCOUNTER — Other Ambulatory Visit: Payer: Self-pay | Admitting: Adult Health

## 2022-09-16 ENCOUNTER — Encounter (HOSPITAL_COMMUNITY): Payer: Self-pay

## 2022-09-16 ENCOUNTER — Other Ambulatory Visit: Payer: Self-pay

## 2022-09-16 DIAGNOSIS — Z79899 Other long term (current) drug therapy: Secondary | ICD-10-CM | POA: Insufficient documentation

## 2022-09-16 DIAGNOSIS — Z7984 Long term (current) use of oral hypoglycemic drugs: Secondary | ICD-10-CM | POA: Diagnosis not present

## 2022-09-16 DIAGNOSIS — I700 Atherosclerosis of aorta: Secondary | ICD-10-CM | POA: Diagnosis not present

## 2022-09-16 DIAGNOSIS — Z7901 Long term (current) use of anticoagulants: Secondary | ICD-10-CM | POA: Diagnosis not present

## 2022-09-16 DIAGNOSIS — I5030 Unspecified diastolic (congestive) heart failure: Secondary | ICD-10-CM | POA: Insufficient documentation

## 2022-09-16 DIAGNOSIS — R0789 Other chest pain: Secondary | ICD-10-CM | POA: Diagnosis not present

## 2022-09-16 DIAGNOSIS — G20A1 Parkinson's disease without dyskinesia, without mention of fluctuations: Secondary | ICD-10-CM | POA: Diagnosis not present

## 2022-09-16 DIAGNOSIS — E1165 Type 2 diabetes mellitus with hyperglycemia: Secondary | ICD-10-CM | POA: Diagnosis not present

## 2022-09-16 DIAGNOSIS — I2510 Atherosclerotic heart disease of native coronary artery without angina pectoris: Secondary | ICD-10-CM | POA: Diagnosis not present

## 2022-09-16 DIAGNOSIS — R079 Chest pain, unspecified: Secondary | ICD-10-CM

## 2022-09-16 DIAGNOSIS — I959 Hypotension, unspecified: Secondary | ICD-10-CM | POA: Diagnosis not present

## 2022-09-16 DIAGNOSIS — R0602 Shortness of breath: Secondary | ICD-10-CM | POA: Insufficient documentation

## 2022-09-16 DIAGNOSIS — I110 Hypertensive heart disease with heart failure: Secondary | ICD-10-CM | POA: Insufficient documentation

## 2022-09-16 LAB — BASIC METABOLIC PANEL
Anion gap: 13 (ref 5–15)
BUN: 20 mg/dL (ref 8–23)
CO2: 23 mmol/L (ref 22–32)
Calcium: 9.7 mg/dL (ref 8.9–10.3)
Chloride: 102 mmol/L (ref 98–111)
Creatinine, Ser: 0.99 mg/dL (ref 0.61–1.24)
GFR, Estimated: >60 mL/min (ref >60)
Glucose, Bld: 222 mg/dL — ABNORMAL HIGH (ref 70–99)
Potassium: 4.3 mmol/L (ref 3.5–5.1)
Sodium: 138 mmol/L (ref 135–145)

## 2022-09-16 LAB — CBC
HCT: 46 % (ref 39.0–52.0)
Hemoglobin: 15 g/dL (ref 13.0–17.0)
MCH: 29.1 pg (ref 26.0–34.0)
MCHC: 32.6 g/dL (ref 30.0–36.0)
MCV: 89.3 fL (ref 80.0–100.0)
Platelets: 241 10*3/uL (ref 150–400)
RBC: 5.15 MIL/uL (ref 4.22–5.81)
RDW: 13.7 % (ref 11.5–15.5)
WBC: 10.2 10*3/uL (ref 4.0–10.5)
nRBC: 0 % (ref 0.0–0.2)

## 2022-09-16 LAB — TROPONIN I (HIGH SENSITIVITY)
Troponin I (High Sensitivity): 9 ng/L (ref <18)
Troponin I (High Sensitivity): 9 ng/L (ref <18)

## 2022-09-16 LAB — BRAIN NATRIURETIC PEPTIDE: B Natriuretic Peptide: 45.5 pg/mL (ref 0.0–100.0)

## 2022-09-16 NOTE — ED Provider Notes (Signed)
[facility] EMERGENCY DEPARTMENT Provider Note   CSN: [id number] Arrival date & time: 09/16/22  1411     History  Chief Complaint  Patient presents with   Chest Pain      Jordan [name] is a 81 y.o. 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  The patient starts that sometime in the middle of the night he had an episode of chest pain on the left side of his chest that felt like a little bit of pressure that lasted no more than 15 seconds.  It has not recurred since then.  He also had a sensation of feeling a little short of breath this morning while he was wearing his CPAP machine, which is since completely resolved.  He does have a history of DVT after a prior surgery, however he currently takes Xarelto and has not missed any doses, has no new leg swelling or pain, hemoptysis, recent surgery or travel, or missed doses of Xarelto.  He reports a chronic cough with no recent change and no fever.  He states that he is currently asymptomatic and feels at his baseline.  The history is provided by the patient and medical records.  Chest Pain       Home Medications Prior to Admission medications   Medication Sig Start Date End Date Taking? Authorizing Provider  acetaminophen (TYLENOL) 500 MG tablet Take 500-1,000 mg by mouth every 6 (six) hours as needed for moderate pain or mild pain.    [provider]  atorvastatin (LIPITOR) 40 MG tablet Take 0.5 tablets (20 mg total) by mouth daily. 07/30/21   Medina-Vargas, Monina C, NP  bifidobacterium infantis (ALIGN) capsule Take 1 capsule by mouth daily.    [provider]  Calcium Carb-Cholecalciferol (CALCIUM 600-D PO) Take by mouth in the morning and at bedtime.    [provider]  Camphor-Menthol-Methyl Sal (HM SALONPAS PAIN RELIEF EX) Apply 1 patch topically daily as needed (Back pain).    [provider]  carbidopa-levodopa (SINEMET IR) 25-100 MG tablet Take 1.5 tablets by mouth 3 (three) times daily. 08/19/22   Garvin Fila, MD  Carbidopa-Levodopa ER (SINEMET CR) 25-100 MG tablet controlled release TAKE 1 TABLET BY MOUTH EVERYDAY AT BEDTIME Patient taking differently: Take 1 tablet by mouth at bedtime. 03/10/22   Frann Rider, NP  cholecalciferol (VITAMIN D3) 25 MCG (1000 UNIT) tablet Take 1,000 Units by mouth daily.    [provider]  clotrimazole-betamethasone (LOTRISONE) cream Apply 1 Application topically daily as needed (Rash). 06/21/22   [provider]  docusate sodium (COLACE) 100 MG capsule Take 1 capsule (100 mg total) by mouth 2 (two) times daily. Patient taking differently: Take 100 mg by mouth daily as needed for mild constipation or moderate constipation. 02/17/22   Hosie Poisson, MD  feeding supplement, GLUCERNA SHAKE, (GLUCERNA SHAKE) LIQD Take 237 mLs by mouth 2 (two) times daily between meals. Alternate with ensure Diabetic    [provider]  furosemide (LASIX) 20 MG tablet USE AS NEEDED FOR UP TO 5 DAYS IF SWELLING IN LEGS RECURS. CONTACT CARDIOLOGY IS SWELLING DOES NOT IMPROVE OR GETS WORSE. 01/26/22   Raulkar, Clide Deutscher, MD  lidocaine (LIDODERM) 5 % Place 1 patch onto the skin daily as needed. Purchase over the  counter. On for 12 hours and off for 12 hours 01/18/22   Love, Ivan Anchors, PA-C  magnesium gluconate (MAGONATE) 500 MG tablet Take 0.5 tablets (250 mg total) by mouth at bedtime. 03/24/22   Raulkar, Clide Deutscher, MD  Melatonin 10 MG TABS Take 10 mg by mouth at bedtime.    [provider]  metFORMIN (GLUCOPHAGE) 1000 MG tablet Take 1 tablet (1,000 mg total) by mouth daily with breakfast. Patient taking differently: Take 500 mg by mouth 2 (two) times daily with a meal. 01/18/22   Love, Ivan Anchors, PA-C  metoprolol tartrate (LOPRESSOR) 25 MG tablet Take 1 tablet (25 mg total) by mouth 2 (two) times daily. Patient taking differently: Take 12.5 mg by mouth 2 (two) times daily. 07/30/21   Medina-Vargas, Monina C, NP  modafinil (PROVIGIL) 100 MG tablet 1 or 2 tabs each morning for alertness 08/02/22   Baird Lyons D, MD  Multiple Vitamin (MULTIVITAMIN WITH MINERALS) TABS tablet Take 1 tablet by mouth daily. 02/19/18   Georgette Shell, MD  NITROSTAT 0.4 MG SL tablet PLACE 1 TABLET (0.4 MG TOTAL) UNDER THE TONGUE EVERY 5 (FIVE) MINUTES AS NEEDED FOR CHEST PAIN. Patient taking differently: Place 0.4 mg under the tongue every 5 (five) minutes as needed for chest pain. 03/31/16   Belva Crome, MD  Omega-3 Fatty Acids (OMEGA 3 PO) Take 1,280 mg by mouth daily.    [provider]  polyethylene glycol (MIRALAX / GLYCOLAX) 17 g packet Take 17 g by mouth daily. 01/09/22   Antonieta Pert, MD  polyvinyl alcohol (LIQUIFILM TEARS) 1.4 % ophthalmic solution Place 1 drop into both eyes as needed for dry eyes. Patient taking differently: Place 1 drop into both eyes daily as needed for dry eyes. 01/18/22   Love, Ivan Anchors, PA-C  QUEtiapine (SEROQUEL) 25 MG tablet Take 1 tablet (25 mg total) by mouth at bedtime. 07/13/22   Frann Rider, NP  Rivaroxaban (XARELTO) 15 MG TABS tablet Take 1 tablet (15 mg total) by mouth daily with supper. 07/23/22   Truitt Merle, MD  Vitamin D, Ergocalciferol, (DRISDOL) 1.25 MG (50000 UNIT) CAPS capsule TAKE 1 CAPSULE (50,000 UNITS TOTAL) BY MOUTH EVERY 7 (SEVEN) DAYS 05/06/22   Raulkar, Clide Deutscher, MD      Allergies    Amantadines, Clonazepam, and Zoloft [sertraline]    Review of Systems   Review of Systems  Cardiovascular:  Positive for chest pain.    Physical Exam Updated Vital Signs BP (!) 142/77   Pulse 75   Temp 98 F (36.7 C)   Resp 17   SpO2 97%   Physical Exam Vitals and nursing note reviewed.  Constitutional:      General: He is not in acute distress.     Appearance: He is not toxic-appearing.  HENT:     Head: Normocephalic and atraumatic.  Cardiovascular:     Rate and Rhythm: Normal rate and regular rhythm.     Pulses:          Radial pulses are 2+ on the right side and 2+ on the left side.       Posterior tibial pulses are 2+ on the right side and 2+ on the left side.     Heart sounds: Normal heart sounds.  Pulmonary:     Effort: Pulmonary effort is normal.     Breath sounds: Normal breath sounds. No wheezing, rhonchi or rales.  Abdominal:     Palpations: Abdomen is soft.  Tenderness: There is no abdominal tenderness.  Musculoskeletal:     Cervical back: Normal range of motion.     Right lower leg: No tenderness. No edema.     Left lower leg: No tenderness. No edema.  Skin:    General: Skin is warm and dry.  Neurological:     General: No focal deficit present.     Mental Status: He is alert.     ED Results / Procedures / Treatments   Labs (all labs ordered are listed, but only abnormal results are displayed) Labs Reviewed  BASIC METABOLIC PANEL - Abnormal; Notable for the following components:      Result Value   Glucose, Bld 222 (*)    All other components within normal limits  CBC  BRAIN NATRIURETIC PEPTIDE  TROPONIN I (HIGH SENSITIVITY)  TROPONIN I (HIGH SENSITIVITY)   Radiology DG Chest 2 View  Result Date: 09/16/2022 CLINICAL DATA:  Chest pain. EXAM: CHEST - 2 VIEW COMPARISON:  CT examination dated August 12, 2022 FINDINGS: The heart size and mediastinal contours are within normal limits. Pacemaker leads in the right atrium and right ventricle. Atherosclerotic calcification of the aortic arch. Both lungs are clear. The visualized skeletal structures are unremarkable. IMPRESSION: No active cardiopulmonary disease. Electronically Signed   By: Keane Police D.O.   On: 09/16/2022 16:56    Procedures Procedures    Medications Ordered in ED Medications - No data to display  ED Course/ Medical Decision Making/  A&P                           Medical Decision Making Problems Addressed: Chest pain, unspecified type: acute illness or injury that poses a threat to life or bodily functions SOB (shortness of breath): acute illness or injury that poses a threat to life or bodily functions  Amount and/or Complexity of Data Reviewed Independent Historian: spouse Labs: ordered. Decision-making details documented in ED Course. Radiology: ordered and independent interpretation performed. Decision-making details documented in ED Course. ECG/medicine tests: ordered. Decision-making details documented in ED Course.   HILDRETH ROBART is a 81 y.o. 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 The patient is in no acute distress and is non-toxic appearing. Upon my evaluation patient patient endorses a brief 15-second episode of chest pain last night that is since resolved.  Also reports shortness of breath while wearing his CPAP this morning that has resolved.   On initial exam, patient is well-appearing and hemodynamically stable.  He is satting well on room air without hypoxia or tachypnea, no tachycardia.  Initial Testing:  EKG: Atrial paced regular rhythm, PR prolonged at 278 ms per computer however and leads without artifact on my review within normal limits.  Right bundle branch block which is consistent with priors.  No STEMI.  CXR: No evidence of acute cardiopulmonary abnormality on my independent review, including no signs of pneumonia, widened mediastinum, pleural effusion, pulmonary edema, or enlarged cardiac silhouette.  Differential Diagnosis:  The EKG I obtained reveals no anatomical ischemia representing STEMI, however will obtain troponin to evaluate for possible ACS. No concerns for  pericardial tamponade on EKG and in light of patient's hemodynamic stability. No pain related to supine or prone positions and given EKG and lack of recent viral illness, low  suspicion for pericarditis. Additionally do not suspect myocarditis as patient without fever and overall well appearing, however again will further evaluate with troponin. Pain was not described as tearing and does not radiate to back, doubt aortic dissection. Additionally, pulses present bilaterally in upper and lower extremities, and CXR does not show widened mediastinum. History, CXR, and exam do not suggest pulmonary edema/CHF as patient without crackles, significant lower extremity edema, or JVD. As CXR without evidence of infiltrates, patient is afebrile, without new cough, I do not suspect pneumonia. CXR also shows no evidence of pneumothorax. No wheezing or history of COPD/asthma to suggest reactive airway disease.  Feel pulmonary embolism unlikely as patient without tachycardia, hypoxia, evidence of DVT on exam, hemoptysis, recent travel or surgery, and is already on Xarelto and he and his wife both report compliance.  Remainder of Workup: Labs demonstrate CBC unremarkable, no anemia or leukocytosis. BMP with glucose elevated to 222 without elevation in anion gap, otherwise within normal limits doubt electrolyte derangements, creatinine within normal limits at 0.99. Serial troponins 9, 9.  BNP within normal limits at 45.  ED Course:  Patient remained hemodynamically stable and asymptomatic while in the emergency department.  Workup today reassuring against emergency cause of chest pain. EKG without evidence of acute ischemia, serial troponins negative, CXR without evidence of acute cardiopulmonary abnormality, and labs otherwise unremarkable.    I re-evaluated the patient, who reports that he feels at baseline. VS remain reassuring, patient hemodynamically stable and overall well appearing.    I had a discussion with [name] and his wife at the bedside, stating that while we see no signs of acute ischemia or other heart-related emergency today, further outpatient evaluation and testing may be warranted.  The patient was instructed to follow up with PCP to discuss the need for further outpatient testing.  Strict return precautions given.  Patient voiced understanding of and agreement with this plan, and was discharged home in stable condition.          Final Clinical Impression(s) / ED Diagnoses Final diagnoses:  Chest pain, unspecified type  SOB (shortness of breath)    Rx / DC Orders ED Discharge Orders     None         Renard Matter, MD 09/16/22 2049    Elnora Morrison, MD 09/17/22 0040

## 2022-09-16 NOTE — ED Triage Notes (Signed)
Pt reports 15 sec episode of central chest pain last night. He states he came to the ER now because he feels like he isn't breathing well. He denies current chest pain.

## 2022-09-16 NOTE — ED Triage Notes (Signed)
Per [facility] pt coming from home reports having 1 minute of chest pain last night.

## 2022-09-16 NOTE — ED Provider Triage Note (Signed)
Emergency Medicine Provider Triage Evaluation Note  Jordan [name] , a 81 y.o. male  was evaluated in triage.  Pt complains of 1 episode of chest pain that lasted approximately 15 seconds last night.  Patient also woke up this morning and was feeling short of breath.  That resolved after waking up and taking off his CPAP machine.  He has not had any additional episodes of shortness of breath but wanted to come to the ED to get further evaluated.  He denies any fever, chills, cough, congestion.   Review of Systems  Positive:  Negative: See above   Physical Exam  BP 119/75   Pulse 62   Temp 98.2 F (36.8 C) (Oral)   Resp 16   SpO2 95%  Gen:   Awake, no distress   Resp:  Normal effort  MSK:   Moves extremities without difficulty  Other:  3/6 murmur  Medical Decision Making  Medically screening exam initiated at 3:45 PM.  Appropriate orders placed.  Jordan [name] was informed that the remainder of the evaluation will be completed by another provider, this initial triage assessment does not replace that evaluation, and the importance of remaining in the ED until their evaluation is complete.     Myna Bright [place], [place] 09/16/22 [id number]

## 2022-09-16 NOTE — Discharge Instructions (Signed)
You were seen in the emergency department for chest pain or shortness of breath.  While you are here we performed a physical exam, got a chest x-ray and EKG, and checked labs.  These show no emergency cause of your symptoms today.  Please follow-up with your primary doctor to discuss her symptoms, especially if they occur again.  You may require further testing in the future.  Please return to the emergency department if you have new or worsening chest pain, difficulty breathing, passing out, weakness on one side of the body, or if you have any other reasons to think that you need emergency care.  We hope you feel better soon.

## 2022-09-17 ENCOUNTER — Other Ambulatory Visit: Payer: Self-pay | Admitting: Hematology

## 2022-09-17 ENCOUNTER — Other Ambulatory Visit: Payer: Self-pay

## 2022-09-17 ENCOUNTER — Inpatient Hospital Stay: Payer: Medicare (Managed Care)

## 2022-09-17 ENCOUNTER — Encounter: Payer: Self-pay | Admitting: Hematology

## 2022-09-17 ENCOUNTER — Inpatient Hospital Stay (HOSPITAL_BASED_OUTPATIENT_CLINIC_OR_DEPARTMENT_OTHER): Payer: Medicare (Managed Care) | Admitting: Hematology

## 2022-09-17 ENCOUNTER — Inpatient Hospital Stay: Payer: Medicare (Managed Care) | Attending: Physician Assistant

## 2022-09-17 VITALS — BP 103/60 | HR 66 | Temp 98.4°F | Resp 15

## 2022-09-17 DIAGNOSIS — C7B8 Other secondary neuroendocrine tumors: Secondary | ICD-10-CM | POA: Diagnosis not present

## 2022-09-17 DIAGNOSIS — Z79899 Other long term (current) drug therapy: Secondary | ICD-10-CM | POA: Diagnosis not present

## 2022-09-17 DIAGNOSIS — C7A8 Other malignant neuroendocrine tumors: Secondary | ICD-10-CM | POA: Insufficient documentation

## 2022-09-17 LAB — CBC WITH DIFFERENTIAL/PLATELET
Abs Immature Granulocytes: 0.04 10*3/uL (ref 0.00–0.07)
Basophils Absolute: 0.1 10*3/uL (ref 0.0–0.1)
Basophils Relative: 1 %
Eosinophils Absolute: 0.3 10*3/uL (ref 0.0–0.5)
Eosinophils Relative: 3 %
HCT: 42.6 % (ref 39.0–52.0)
Hemoglobin: 14.3 g/dL (ref 13.0–17.0)
Immature Granulocytes: 0 %
Lymphocytes Relative: 25 %
Lymphs Abs: 2.6 10*3/uL (ref 0.7–4.0)
MCH: 29.5 pg (ref 26.0–34.0)
MCHC: 33.6 g/dL (ref 30.0–36.0)
MCV: 87.8 fL (ref 80.0–100.0)
Monocytes Absolute: 0.7 10*3/uL (ref 0.1–1.0)
Monocytes Relative: 7 %
Neutro Abs: 6.6 10*3/uL (ref 1.7–7.7)
Neutrophils Relative %: 64 %
Platelets: 257 10*3/uL (ref 150–400)
RBC: 4.85 MIL/uL (ref 4.22–5.81)
RDW: 13.9 % (ref 11.5–15.5)
WBC: 10.4 10*3/uL (ref 4.0–10.5)
nRBC: 0 % (ref 0.0–0.2)

## 2022-09-17 LAB — COMPREHENSIVE METABOLIC PANEL
ALT: 8 U/L (ref 0–44)
AST: 20 U/L (ref 15–41)
Albumin: 4.2 g/dL (ref 3.5–5.0)
Alkaline Phosphatase: 76 U/L (ref 38–126)
Anion gap: 11 (ref 5–15)
BUN: 27 mg/dL — ABNORMAL HIGH (ref 8–23)
CO2: 24 mmol/L (ref 22–32)
Calcium: 9.9 mg/dL (ref 8.9–10.3)
Chloride: 102 mmol/L (ref 98–111)
Creatinine, Ser: 1.08 mg/dL (ref 0.61–1.24)
GFR, Estimated: >60 mL/min (ref >60)
Glucose, Bld: 308 mg/dL — ABNORMAL HIGH (ref 70–99)
Potassium: 4.3 mmol/L (ref 3.5–5.1)
Sodium: 137 mmol/L (ref 135–145)
Total Bilirubin: 0.5 mg/dL (ref 0.3–1.2)
Total Protein: 7.5 g/dL (ref 6.5–8.1)

## 2022-09-17 MED ORDER — OCTREOTIDE ACETATE 30 MG IM KIT
30.0000 mg | PACK | Freq: Once | INTRAMUSCULAR | Status: AC
  Administered 2022-09-17: 30 mg via INTRAMUSCULAR
  Filled 2022-09-17: qty 1

## 2022-09-17 NOTE — Patient Instructions (Signed)
Octreotide Injection Solution What is this medication? OCTREOTIDE (ok TREE oh tide) treats high levels of growth hormone (acromegaly). It works by reducing the amount of growth hormone your body makes. This reduces symptoms and the risk of health problems caused by too much growth hormone, such as diabetes and heart disease. It may also be used to treat diarrhea caused by neuroendocrine tumors. It works by slowing down the release of serotonin from the tumor cells. This reduces the number of bowel movements you have. This medicine may be used for other purposes; ask your health care provider or pharmacist if you have questions. COMMON BRAND NAME(S): Bynfezia, Sandostatin What should I tell my care team before I take this medication? They need to know if you have any of these conditions: Diabetes Gallbladder disease Kidney disease Liver disease Thyroid disease An unusual or allergic reaction to octreotide, other medications, foods, dyes, or preservatives Pregnant or trying to get pregnant Breast-feeding How should I use this medication? This medication is injected under the skin or into a vein. It is usually given by your care team in a hospital or clinic setting. If you get this medication at home, you will be taught how to prepare and give it. Use exactly as directed. Take it as directed on the prescription label at the same time every day. Keep taking it unless your care team tells you to stop. Allow the injection solution to come to room temperature before use. Do not warm it artificially. It is important that you put your used needles and syringes in a special sharps container. Do not put them in a trash can. If you do not have a sharps container, call your pharmacist or care team to get one. Talk to your care team about the use of this medication in children. Special care may be needed. Overdosage: If you think you have taken too much of this medicine contact a poison control center or  emergency room at once. NOTE: This medicine is only for you. Do not share this medicine with others. What if I miss a dose? If you miss a dose, take it as soon as you can. If it is almost time for your next dose, take only that dose. Do not take double or extra doses. What may interact with this medication? Bromocriptine Certain medications for blood pressure, heart disease, irregular heartbeat Cyclosporine Diuretics Medications for diabetes, including insulin Quinidine This list may not describe all possible interactions. Give your health care provider a list of all the medicines, herbs, non-prescription drugs, or dietary supplements you use. Also tell them if you smoke, drink alcohol, or use illegal drugs. Some items may interact with your medicine. What should I watch for while using this medication? Visit your care team for regular checks on your progress. Tell your care team if your symptoms do not start to get better or if they get worse. To help reduce irritation at the injection site, use a different site for each injection and make sure the solution is at room temperature before use. This medication may cause decreases in blood sugar. Signs of low blood sugar include chills, cool, pale skin or cold sweats, drowsiness, extreme hunger, fast heartbeat, headache, nausea, nervousness or anxiety, shakiness, trembling, unsteadiness, tiredness, or weakness. Contact your care team right away if you experience any of these symptoms. This medication may increase blood sugar. The risk may be higher in patients who already have diabetes. Ask your care team what you can do to lower your   risk of diabetes while taking this medication. You should make sure you get enough vitamin B12 while you are taking this medication. Discuss the foods you eat and the vitamins you take with your care team. What side effects may I notice from receiving this medication? Side effects that you should report to your care  team as soon as possible: Allergic reactions--skin rash, itching, hives, swelling of the face, lips, tongue, or throat Gallbladder problems--severe stomach pain, nausea, vomiting, fever Heart rhythm changes--fast or irregular heartbeat, dizziness, feeling faint or lightheaded, chest pain, trouble breathing High blood sugar (hyperglycemia)--increased thirst or amount of urine, unusual weakness or fatigue, blurry vision Low blood sugar (hypoglycemia)--tremors or shaking, anxiety, sweating, cold or clammy skin, confusion, dizziness, rapid heartbeat Low thyroid levels (hypothyroidism)--unusual weakness or fatigue, increased sensitivity to cold, constipation, hair loss, dry skin, weight gain, feelings of depression Low vitamin B12 level--pain, tingling, or numbness in the hands or feet, muscle weakness, dizziness, confusion, trouble concentrating Pancreatitis--severe stomach pain that spreads to your back or gets worse after eating or when touched, fever, nausea, vomiting Side effects that usually do not require medical attention (report to your care team if they continue or are bothersome): Diarrhea Dizziness Gas Headache Pain, redness, or irritation at injection site Stomach pain This list may not describe all possible side effects. Call your doctor for medical advice about side effects. You may report side effects to FDA at 1-800-FDA-1088. Where should I keep my medication? Keep out of the reach of children and pets. Store in the refrigerator. Protect from light. Allow to come to room temperature naturally. Do not use artificial heat. If protected from light, the injection may be stored between 20 and 30 degrees C (70 and 86 degrees F) for 14 days. After the initial use, throw away any unused portion of a multiple dose vial after 14 days. Get rid of any unused portions of the ampules after use. To get rid of medications that are no longer needed or have expired: Take the medication to a medication  take-back program. Ask your pharmacy or law enforcement to find a location. If you cannot return the medication, ask your pharmacist or care team how to get rid of the medication safely. NOTE: This sheet is a summary. It may not cover all possible information. If you have questions about this medicine, talk to your doctor, pharmacist, or health care provider.  2023 Elsevier/Gold Standard (2022-01-20 00:00:00)  

## 2022-09-17 NOTE — Progress Notes (Signed)
[place]   [phone] [phone]   Clinic Follow up Note   Patient Care Team: [name], MD as PCP - General (Family Medicine) [name], MD (Family Medicine) Truitt Merle, MD as Consulting Physician (Oncology)  Date of Service:  09/17/2022  CHIEF COMPLAINT: f/u of metastatic NET  CURRENT THERAPY:  Sandostatin injections, q28d, starting 08/17/22   ASSESSMENT:  Jordan [name] is a 81 y.o. male with   1. 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 CT AP on 06/17/22 showed: calcified mesenteric mass with suspected associated small bowel mass; suspicious right hepatic lesion; nodular enhancement along wall of bowel loop in central abdomen.  -abdomen MRI 06/23/22 showed: 3.9 cm mass in right hepatic lobe; small RLQ mesenteric mass or lymphadenopathy. 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 -PET DOTATATE from 08/12/22 showed avid disease within the liver, mesentery and small bowel. 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 He tolerated well with no noticeable side effects.  -labs reviewed, CBC is WNL, CMP is stable with BG 308.   2.  Stroke, Parkinson disease, hypertension, diabetes -Continue medical treatment, follow-up with his primary care physician and neurologist.   3. DVT of LLE -Found in June 2023, related to his underlying malignancy and limited mobility -Recommend anticoagulation lifelong, at 15 mg daily due to history of intermittent hematuria.    PLAN: -Proceed with Sandostatin injection today (increase to 22m) and every 4 weeks -lab, f/u, and Sandostatin injection in 12 weeks   SUMMARY OF ONCOLOGIC HISTORY: Oncology History Overview Note   Cancer Staging  Metastatic malignant neuroendocrine tumor to liver ([facility] Staging form: Liver, AJCC 8th Edition - Clinical: Stage IVB (cTX, cNX, pM1) - Signed by FTruitt Merle MD  on 07/23/2022    Metastatic malignant neuroendocrine tumor to liver ([facility]  06/17/2022 Imaging   CLINICAL DATA:  Lower abdominal pain for 2-3 weeks in a male age 81   EXAM: CT ABDOMEN AND PELVIS WITH CONTRAST  IMPRESSION: 1. 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 2. Hepatic lesion as discussed above in the posterior RIGHT hemiliver is suspicious for metastasis in this context. For all above findings in lieu of hepatic MRI, if the patient is not able to undergo hepatic MRI, biochemical correlation and DOTATATE PET may be helpful. 3. Bowel loop in the central abdomen with nodular enhancement approximately 8-9 mm along the wall in terms of greatest wall thickness. 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 4. Hepatic steatosis. 5. Resolution of inflammation about the pancreatic tail with nodular appearance of pancreatic tail. Attention on subsequent imaging is suggested. This may reflect sequela of prior inflammation given improvement but underlying lesion in this location while not favored is not excluded.  ADDENDUM: For clarification, the central, ileal mesenteric lesion currently measuring 2.1 x 2.1 x 2.4 cm measured 1.6 x 1.8 x 2.1 cm in 2015.   Faintly visible area in the posterior RIGHT hemiliver in 2015 raising the question of possibility of small lesion at best 2 x 2.2 cm in the posterior RIGHT hemiliver this areas now clearly visible measuring 4.1 x 4.0 cm. Findings are compatible with indolent behavior. Indolent carcinoid remains the primary differential consideration. Tumor marker correlation is suggested with DOTATATE  PET for further evaluation as warranted.   06/23/2022 Imaging   EXAM: MRI ABDOMEN WITHOUT AND  WITH CONTRAST  IMPRESSION: 3.9 cm heterogeneous enhancing mass in segment 7 of the right hepatic lobe, which is nonspecific. Hepatic metastasis cannot be excluded.   Small benign hemangioma and cysts in the posterior right hepatic lobe.   Small right lower quadrant mesenteric mass or lymphadenopathy, without significant change.   Stable 2.1 cm cystic lesion in the pancreatic neck, with other tiny sub-cm cystic foci in the pancreatic head and body. Differential diagnosis includes pancreatic pseudocysts and indolent IPMNs. Recommend continued follow-up by MRI in 6 months. This recommendation follows ACR consensus guidelines: Management of Incidental Pancreatic Cysts: A White Paper of the ACR Incidental Findings Committee. J Am Coll Radiol 5102;58:527-782.   7 mm calculus in the left renal pelvis, with mild left pelvicaliectasis.   07/14/2022 Initial Biopsy   FINAL MICROSCOPIC DIAGNOSIS:   A. LIVER, RIGHT, NEEDLE CORE BIOPSY:  -  Metastatic low-grade neuroendocrine tumor (positive for chromogranin and synaptophysin).  -  Proliferation rate by Ki-67 less than 3%.    07/23/2022 Initial Diagnosis   Metastatic malignant neuroendocrine tumor to liver [facility])   07/23/2022 Cancer Staging   Staging form: Liver, AJCC 8th Edition - Clinical: Stage IVB (cTX, cNX, pM1) - Signed by Truitt Merle, MD on 07/23/2022      INTERVAL HISTORY:  Jordan [name] is here for a follow up of metastatic NET. He was last seen by PA Murray Hodgkins on 08/17/22. He presents to the clinic accompanied by his wife. They told me about his ED visit yesterday for an instance of chest pain. They deny any SOB or increased breathing effort. His wife notes he does have a pacemaker in place. He explains this pain has happened one or two other times over the last few years and has not recurred since the night before last. His wife reports he has more fatigue and lower appetite.   All other systems were reviewed with the patient and are  negative.  MEDICAL HISTORY:  Past Medical History:  Diagnosis Date   Arthritis    R shoulder, bone spur   Arthritis    "hands" (03/25/2016)   Chronic diastolic heart failure (HCC)    Chronic lower back pain    Coronary heart disease    Dr Pernell Dupre   Diverticulitis 12/2021   DVT (deep venous thrombosis) ([place]) 05/2022   left leg   H/O cardiovascular stress test    perhaps last one was 2009   Heart murmur    12/29/11 echo: mild MR, no AS, trivial TR   History of gout    Hyperlipidemia    Hypertension    saw Dr. Linard Millers last earl;y- 2014, cardiac cath. last done ?2009, blocks seen didn't require any intervention at that point.   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   EP IMPLANTABLE DEVICE N/A 03/25/2016   Procedure: Pacemaker Implant - Dual Chamber;  Surgeon: GEvans Lance MD;  [facility];  Service: Cardiovascular;  Laterality:  N/A;   FRACTURE SURGERY     HIP FRACTURE SURGERY     INSERT / REPLACE / REMOVE PACEMAKER  03/24/2016   INTRAMEDULLARY (IM) NAIL INTERTROCHANTERIC Left 02/08/2022   Procedure: INTRAMEDULLARY (IM) NAIL INTERTROCHANTRIC;  Surgeon: Meredith Pel, MD;  Location: [place];  Service: Orthopedics;  Laterality: Left;   IR KYPHO LUMBAR INC FX REDUCE BONE BX UNI/BIL CANNULATION INC/IMAGING  12/29/2021   IR RADIOLOGIST EVAL & MGMT  12/15/2021   IR RADIOLOGY PERIPHERAL GUIDED IV START  07/14/2022   IR US GUIDE VASC ACCESS RIGHT  07/15/2022   LEFT AND RIGHT HEART CATHETERIZATION WITH CORONARY ANGIOGRAM N/A 06/05/2014   Procedure: LEFT AND RIGHT HEART CATHETERIZATION WITH CORONARY ANGIOGRAM;  Surgeon:  Sinclair Grooms, MD;  Location: [facility] CATH LAB;  Service: Cardiovascular;  Laterality: N/A;   NASAL FRACTURE SURGERY     SHOULDER ARTHROSCOPY WITH ROTATOR CUFF REPAIR AND SUBACROMIAL DECOMPRESSION Right 08/16/2013   Procedure: SHOULDER ARTHROSCOPY WITH ROTATOR CUFF REPAIR AND SUBACROMIAL DECOMPRESSION;  Surgeon: [name], MD;  Location: [place];  Service: Orthopedics;  Laterality: Right;  Right shoulder arthroscopy rotator cuff repair, subacromial decompression.   TONSILLECTOMY      I have reviewed the social history and family history with the patient and they are unchanged from previous note.  ALLERGIES:  is allergic to amantadines, clonazepam, and zoloft [sertraline].  MEDICATIONS:  Current Outpatient Medications  Medication Sig Dispense Refill   acetaminophen (TYLENOL) 500 MG tablet Take 500-1,000 mg by mouth every 6 (six) hours as needed for moderate pain or mild pain.     atorvastatin (LIPITOR) 40 MG tablet Take 0.5 tablets (20 mg total) by mouth daily. 30 tablet 0   bifidobacterium infantis (ALIGN) capsule Take 1 capsule by mouth daily.     Calcium Carb-Cholecalciferol (CALCIUM 600-D PO) Take by mouth in the morning and at bedtime.     Camphor-Menthol-Methyl Sal (HM SALONPAS PAIN RELIEF EX) Apply 1 patch topically daily as needed (Back pain).     carbidopa-levodopa (SINEMET IR) 25-100 MG tablet Take 1.5 tablets by mouth 3 (three) times daily. 180 tablet 2   Carbidopa-Levodopa ER (SINEMET CR) 25-100 MG tablet controlled release TAKE 1 TABLET BY MOUTH EVERYDAY AT BEDTIME (Patient taking differently: Take 1 tablet by mouth at bedtime.) 90 tablet 1   cholecalciferol (VITAMIN D3) 25 MCG (1000 UNIT) tablet Take 1,000 Units by mouth daily.     clotrimazole-betamethasone (LOTRISONE) cream Apply 1 Application topically daily as needed (Rash).     docusate sodium (COLACE) 100 MG capsule Take 1 capsule (100 mg total) by mouth 2 (two) times daily. (Patient taking differently: Take 100  mg by mouth daily as needed for mild constipation or moderate constipation. As Needed) 10 capsule 0   feeding supplement, GLUCERNA SHAKE, (GLUCERNA SHAKE) LIQD Take 237 mLs by mouth 2 (two) times daily between meals. Alternate with ensure Diabetic     furosemide (LASIX) 20 MG tablet USE AS NEEDED FOR UP TO 5 DAYS IF SWELLING IN LEGS RECURS. CONTACT CARDIOLOGY IS SWELLING DOES NOT IMPROVE OR GETS WORSE. 15 tablet 0   lidocaine (LIDODERM) 5 % Place 1 patch onto the skin daily as needed. Purchase over the counter. On for 12 hours and off for 12 hours 30 patch 0   magnesium gluconate (MAGONATE) 500 MG tablet Take 0.5 tablets (250 mg total) by mouth at bedtime. 30 tablet 0   Melatonin 10 MG TABS Take 10 mg by mouth at bedtime.  metFORMIN (GLUCOPHAGE) 1000 MG tablet Take 1 tablet (1,000 mg total) by mouth daily with breakfast. (Patient taking differently: Take 500 mg by mouth 2 (two) times daily with a meal.) 30 tablet 0   metoprolol tartrate (LOPRESSOR) 25 MG tablet Take 1 tablet (25 mg total) by mouth 2 (two) times daily. (Patient taking differently: Take 12.5 mg by mouth 2 (two) times daily.) 60 tablet 0   modafinil (PROVIGIL) 100 MG tablet 1 or 2 tabs each morning for alertness 30 tablet 0   Multiple Vitamin (MULTIVITAMIN WITH MINERALS) TABS tablet Take 1 tablet by mouth daily.     NITROSTAT 0.4 MG SL tablet PLACE 1 TABLET (0.4 MG TOTAL) UNDER THE TONGUE EVERY 5 (FIVE) MINUTES AS NEEDED FOR CHEST PAIN. (Patient taking differently: Place 0.4 mg under the tongue every 5 (five) minutes as needed for chest pain.) 25 tablet 5   Omega-3 Fatty Acids (OMEGA 3 PO) Take 1,280 mg by mouth daily.     polyethylene glycol (MIRALAX / GLYCOLAX) 17 g packet Take 17 g by mouth daily. 14 each 0   polyvinyl alcohol (LIQUIFILM TEARS) 1.4 % ophthalmic solution Place 1 drop into both eyes as needed for dry eyes. (Patient taking differently: Place 1 drop into both eyes daily as needed for dry eyes.) 15 mL 0   QUEtiapine  (SEROQUEL) 25 MG tablet Take 1 tablet (25 mg total) by mouth at bedtime. 30 tablet 5   Rivaroxaban (XARELTO) 15 MG TABS tablet Take 1 tablet (15 mg total) by mouth daily with supper. 90 tablet 1   Vitamin D, Ergocalciferol, (DRISDOL) 1.25 MG (50000 UNIT) CAPS capsule TAKE 1 CAPSULE (50,000 UNITS TOTAL) BY MOUTH EVERY 7 (SEVEN) DAYS 5 capsule 0   No current facility-administered medications for this visit.    PHYSICAL EXAMINATION: ECOG PERFORMANCE STATUS: 3 - Symptomatic, >50% confined to bed  Vitals:   09/17/22 1434  BP: 103/60  Pulse: 66  Resp: 15  Temp: 98.4 F (36.9 C)  SpO2: 94%   Wt Readings from Last 3 Encounters:  09/16/22 220 lb (99.8 kg)  08/17/22 223 lb 4.8 oz (101.3 kg)  07/23/22 221 lb 12.8 oz (100.6 kg)     GENERAL:alert, no distress and comfortable SKIN: skin color, texture, turgor are normal, no rashes or significant lesions EYES: normal, Conjunctiva are pink and non-injected, sclera clear LUNGS: normal breathing effort, (+) crackling to lung bases. He is not taking deep breaths. HEART: regular rate & rhythm and no murmurs and no lower extremity edema  NEURO: alert & oriented x 3 with fluent speech, no focal motor/sensory deficits  LABORATORY DATA:  I have reviewed the data as listed    Latest Ref Rng & Units 09/17/2022    1:49 PM 09/16/2022    4:03 PM 08/17/2022    3:09 PM  CBC  WBC 4.0 - 10.5 K/uL 10.4  10.2  8.5   Hemoglobin 13.0 - 17.0 g/dL 14.3  15.0  13.9   Hematocrit 39.0 - 52.0 % 42.6  46.0  42.0   Platelets 150 - 400 K/uL 257  241  255         Latest Ref Rng & Units 09/17/2022    1:49 PM 09/16/2022    4:03 PM 08/17/2022    3:09 PM  CMP  Glucose 70 - 99 mg/dL 308  222  337   BUN 8 - 23 mg/dL _0 Creatinine 0.61 - 1.24 mg/dL 1.08  0.99  1.01  Sodium 135 - 145 mmol/L 137  138  138   Potassium 3.5 - 5.1 mmol/L 4.3  4.3  4.5   Chloride 98 - 111 mmol/L 102  102  103   CO2 22 - 32 mmol/L _0 Calcium 8.9 - 10.3 mg/dL 9.9   9.7  9.8   Total Protein 6.5 - 8.1 g/dL 7.5   7.3   Total Bilirubin 0.3 - 1.2 mg/dL 0.5   0.4   Alkaline Phos 38 - 126 U/L 76   85   AST 15 - 41 U/L 20   22   ALT 0 - 44 U/L 8   6       RADIOGRAPHIC STUDIES: I have personally reviewed the radiological images as listed and agreed with the findings in the report. DG Chest 2 View  Result Date: 09/16/2022 CLINICAL DATA:  Chest pain. EXAM: CHEST - 2 VIEW COMPARISON:  CT examination dated August 12, 2022 FINDINGS: The heart size and mediastinal contours are within normal limits. Pacemaker leads in the right atrium and right ventricle. Atherosclerotic calcification of the aortic arch. Both lungs are clear. The visualized skeletal structures are unremarkable. IMPRESSION: No active cardiopulmonary disease. Electronically Signed   By: Keane Police D.O.   On: 09/16/2022 16:56      No orders of the defined types were placed in this encounter.  All questions were answered. The patient knows to call the clinic with any problems, questions or concerns. No barriers to learning was detected. The total time spent in the appointment was 30 minutes.     Truitt Merle, MD 09/17/2022   I, Wilburn Mylar, am acting as scribe for Truitt Merle, MD.   I have reviewed the above documentation for accuracy and completeness, and I agree with the above.

## 2022-09-21 LAB — CHROMOGRANIN A: Chromogranin A (ng/mL): 64.2 ng/mL (ref 0.0–101.8)

## 2022-09-24 ENCOUNTER — Telehealth: Payer: Self-pay | Admitting: Internal Medicine

## 2022-09-27 ENCOUNTER — Other Ambulatory Visit (HOSPITAL_COMMUNITY): Payer: Self-pay

## 2022-09-27 ENCOUNTER — Ambulatory Visit: Payer: Medicare (Managed Care) | Admitting: Neurology

## 2022-09-27 MED ORDER — MODAFINIL 100 MG PO TABS
ORAL_TABLET | ORAL | 1 refills | Status: DC
  Filled 2022-09-27: qty 90, 45d supply, fill #0

## 2022-09-27 NOTE — Telephone Encounter (Signed)
Modafinil refilled.

## 2022-09-28 DIAGNOSIS — L98419 Non-pressure chronic ulcer of buttock with unspecified severity: Secondary | ICD-10-CM | POA: Diagnosis not present

## 2022-09-30 DIAGNOSIS — H04123 Dry eye syndrome of bilateral lacrimal glands: Secondary | ICD-10-CM | POA: Diagnosis not present

## 2022-09-30 DIAGNOSIS — M9903 Segmental and somatic dysfunction of lumbar region: Secondary | ICD-10-CM | POA: Diagnosis not present

## 2022-09-30 DIAGNOSIS — M5136 Other intervertebral disc degeneration, lumbar region: Secondary | ICD-10-CM | POA: Diagnosis not present

## 2022-09-30 DIAGNOSIS — M9905 Segmental and somatic dysfunction of pelvic region: Secondary | ICD-10-CM | POA: Diagnosis not present

## 2022-09-30 DIAGNOSIS — M9904 Segmental and somatic dysfunction of sacral region: Secondary | ICD-10-CM | POA: Diagnosis not present

## 2022-10-04 DIAGNOSIS — L98491 Non-pressure chronic ulcer of skin of other sites limited to breakdown of skin: Secondary | ICD-10-CM | POA: Diagnosis not present

## 2022-10-05 DIAGNOSIS — M5136 Other intervertebral disc degeneration, lumbar region: Secondary | ICD-10-CM | POA: Diagnosis not present

## 2022-10-05 DIAGNOSIS — M9905 Segmental and somatic dysfunction of pelvic region: Secondary | ICD-10-CM | POA: Diagnosis not present

## 2022-10-05 DIAGNOSIS — M9904 Segmental and somatic dysfunction of sacral region: Secondary | ICD-10-CM | POA: Diagnosis not present

## 2022-10-05 DIAGNOSIS — M9903 Segmental and somatic dysfunction of lumbar region: Secondary | ICD-10-CM | POA: Diagnosis not present

## 2022-10-07 MED ORDER — MODAFINIL 200 MG PO TABS: 200.0000 mg | ORAL_TABLET | Freq: Every day | ORAL | 5 refills | Status: DC

## 2022-10-07 NOTE — Telephone Encounter (Signed)
Called and spoke to patient and advised him that the medication was sent into pharmacy. Nothing further needed

## 2022-10-07 NOTE — Telephone Encounter (Signed)
Modafinil 200 mg sent to [facility]

## 2022-10-07 NOTE — Telephone Encounter (Signed)
Called patient back and she states the pharmacists is telling them they need the Provigil '200mg'$  sent in. I advised the emergency contact based on the directions that would not be right. Because Dr Bertrum Sol orders are 1-2 tablets each morning.   Oh and sir she states that last month the medication was sent to the wrong pharmacy. [facility] [address]  Can you send what ever medication you seem acceptable for this patient  Thank you

## 2022-10-12 DIAGNOSIS — M9905 Segmental and somatic dysfunction of pelvic region: Secondary | ICD-10-CM | POA: Diagnosis not present

## 2022-10-12 DIAGNOSIS — M5136 Other intervertebral disc degeneration, lumbar region: Secondary | ICD-10-CM | POA: Diagnosis not present

## 2022-10-12 DIAGNOSIS — M9903 Segmental and somatic dysfunction of lumbar region: Secondary | ICD-10-CM | POA: Diagnosis not present

## 2022-10-12 DIAGNOSIS — M9904 Segmental and somatic dysfunction of sacral region: Secondary | ICD-10-CM | POA: Diagnosis not present

## 2022-10-14 ENCOUNTER — Encounter: Payer: Self-pay | Admitting: Physical Medicine and Rehabilitation

## 2022-10-14 ENCOUNTER — Encounter
Payer: Medicare (Managed Care) | Attending: Physical Medicine and Rehabilitation | Admitting: Physical Medicine and Rehabilitation

## 2022-10-14 VITALS — BP 96/64 | HR 73 | Ht 68.0 in | Wt 222.0 lb

## 2022-10-14 DIAGNOSIS — I6381 Other cerebral infarction due to occlusion or stenosis of small artery: Secondary | ICD-10-CM | POA: Insufficient documentation

## 2022-10-14 DIAGNOSIS — C7B8 Other secondary neuroendocrine tumors: Secondary | ICD-10-CM | POA: Insufficient documentation

## 2022-10-14 NOTE — Progress Notes (Addendum)
Subjective:    Patient ID: Jordan [name], male    DOB: [birth year], 81 y.o.   MRN: [id number]  HPI Jordan [name] is an 81 year old man who presents for follow-up after hip fracture.   1) Left hip fracture:  -he is ambulating with a walker CG from wife -his wife accompanies him today and provides most of history -his walking discharge is very short.   2) Parkinson's: -sometimes muscles don't cooperate the way they should. -he has vascular parkinsonism  3) Right leg weakness: -when walking.  -wife is nervous about him falling -he was in skilled rehab and was discharged June 8th  -they have not yet set up outpatient therapy -he has not had any professional therapy since discharge on June 8th.   4) 6 bottom teeth removed -has more of a challenge in forming words.   5) Neuroendocrine tumor -being treated with monthly octreotide -no current symptoms currently -was having persistent abdominal pain- they thought it was a flare of diverticulitis but this was not present -CBGs increase because of octreotide- CBGs as high as 250/260 -he asks for icecream  Pain Inventory Average Pain  varies between 3-6 Pain Right Now 3 My pain is intermittent, dull, and aching  LOCATION OF PAIN  back and lower left hip  BOWEL Number of stools per week: 1-2 Oral laxative use Yes  Type of laxative Miralax, senna plus   BLADDER Pads Bladder incontinence Yes  Frequent urination No  Leakage with coughing No  Difficulty starting stream No  Incomplete bladder emptying No    Mobility walk with assistance use a walker how many minutes can you walk? 1-2 ability to climb steps?  no do you drive?  no use a wheelchair needs help with transfers Do you have any goals in this area?  yes  Function retired  Neuro/Psych bladder control problems weakness tremor trouble walking dizziness loss of taste or smell  Prior Studies Any changes since last visit?  no  Physicians involved in  your care Any changes since last visit?  no   Family History  Problem Relation Age of Onset   Sleep apnea Sister    Colon cancer Sister    Social History   Socioeconomic History   Marital status: Married    Spouse name: [name]   Number of children: Not on file   Years of education: Not on file   Highest education level: Not on file  Occupational History   Occupation: [employer]   Occupation: [place] [employer]  Tobacco Use   Smoking status: Former    Packs/day: 2.50    Years: 6.00    Total pack years: 15.00    Types: Cigarettes    Quit date: 06/05/1967    Years since quitting: 55.3   Smokeless tobacco: Never  Vaping Use   Vaping Use: Never used  Substance and Sexual Activity   Alcohol use: Yes    Comment: rare   Drug use: No   Sexual activity: Not Currently  Other Topics Concern   Not on file  Social History Narrative   Lives with wife and son   Right handed   Drinks 1 cups caffeine daily   Social Determinants of Health   Financial Resource Strain: Not on file  Food Insecurity: Not on file  Transportation Needs: Not on file  Physical Activity: Not on file  Stress: Not on file  Social Connections: Not on file   Past Surgical History:  Procedure Laterality Date  CARDIAC CATHETERIZATION  2009?   EP IMPLANTABLE DEVICE N/A 03/25/2016   Procedure: Pacemaker Implant - Dual Chamber;  Surgeon: Evans Lance, MD;  Location: [place] [facility];  Service: Cardiovascular;  Laterality: N/A;   FRACTURE SURGERY     HIP FRACTURE SURGERY     INSERT / REPLACE / REMOVE PACEMAKER  03/24/2016   INTRAMEDULLARY (IM) NAIL INTERTROCHANTERIC Left 02/08/2022   Procedure: INTRAMEDULLARY (IM) NAIL INTERTROCHANTRIC;  Surgeon: Meredith Pel, MD;  Location: [place];  Service: Orthopedics;  Laterality: Left;   IR KYPHO LUMBAR INC FX REDUCE BONE BX UNI/BIL CANNULATION INC/IMAGING  12/29/2021   IR RADIOLOGIST EVAL & MGMT  12/15/2021   IR RADIOLOGY PERIPHERAL GUIDED IV START   07/14/2022   IR US GUIDE VASC ACCESS RIGHT  07/15/2022   LEFT AND RIGHT HEART CATHETERIZATION WITH CORONARY ANGIOGRAM N/A 06/05/2014   Procedure: LEFT AND RIGHT HEART CATHETERIZATION WITH CORONARY ANGIOGRAM;  Surgeon: Sinclair Grooms, MD;  Location: [facility] CATH LAB;  Service: Cardiovascular;  Laterality: N/A;   NASAL FRACTURE SURGERY     SHOULDER ARTHROSCOPY WITH ROTATOR CUFF REPAIR AND SUBACROMIAL DECOMPRESSION Right 08/16/2013   Procedure: SHOULDER ARTHROSCOPY WITH ROTATOR CUFF REPAIR AND SUBACROMIAL DECOMPRESSION;  Surgeon: [name], MD;  Location: [place];  Service: Orthopedics;  Laterality: Right;  Right shoulder arthroscopy rotator cuff repair, subacromial decompression.   TONSILLECTOMY     Past Medical History:  Diagnosis Date   Arthritis    R shoulder, bone spur   Arthritis    "hands" (03/25/2016)   Chronic diastolic heart failure (HCC)    Chronic lower back pain    Coronary heart disease    Dr Pernell Dupre   Diverticulitis 12/2021   DVT (deep venous thrombosis) ([place]) 05/2022   left leg   H/O cardiovascular stress test    perhaps last one was 2009   Heart murmur    12/29/11 echo: mild MR, no AS, trivial TR   History of gout    Hyperlipidemia    Hypertension    saw Dr. Linard Millers last earl;y- 2014, cardiac cath. last done ?2009, blocks seen didn't require any intervention at that point.   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  Opioid Risk Score:   Fall Risk Score:  `1  [facility]     04/19/2022   11:18 AM 05/09/2020   10:37 AM  Depression screen PHQ 2/9  Decreased Interest 0 0  Down, Depressed, Hopeless 1 0  PHQ - 2 Score 1 0  Altered sleeping 0   Tired,  decreased energy 1   Change in appetite 0   Feeling bad or failure about yourself  0   Trouble concentrating 0   Moving slowly or fidgety/restless 1   Suicidal thoughts 0   PHQ-9 Score 3      Review of Systems  Constitutional: Negative.   HENT: Negative.    Eyes: Negative.   Respiratory:  Positive for apnea and cough.   Cardiovascular: Negative.   Gastrointestinal:  Positive for constipation.  Endocrine: Negative.   Genitourinary:  Positive for urgency.  Musculoskeletal:  Positive for back pain and gait problem.  Skin: Negative.   Allergic/Immunologic: Negative.   Neurological:  Positive for dizziness, tremors and weakness.  Hematological:  Bruises/bleeds easily.  Psychiatric/Behavioral: Negative.        Objective:   Physical Exam Gen: no distress, normal appearing, 100/64 HEENT: oral mucosa pink and moist, NCAT Cardio: Reg rate Chest: normal effort, normal rate of breathing Abd: soft, non-distended Ext: no edema Psych: pleasant, normal affect Skin: intact Neuro: Alert, flat affect, good sense of humor, wife has to provide most of history, =word finding difficulties Musculoskeletal: In WC, able to use bathroom with wife's assistance       Assessment & Plan:  1) CVA -prescribed PT and OT and SLP -would benefit from handicap placard to increase mobility in the community -reviewed all medications and provided necessary refills. -discussed vagal nerve stimulation if strength fails to improve in 6 months.  -discussed maximizing protein -discussed word finding difficulties -discussed hyperbaric oxygen therapy  2) Fatigue -continue vitamin D supplement -replace amantadine with modafinil.  Provided dietary education.   3) R leg weakess: -discussed relation with quadriceps weakness.   4) Neuroendocrine tumor -discussed octreotide.  -discussed decreased appetite  5) Parkinson's Disease -continue Sinemet  45 minutes spend in discussion of his Parkinson's  Disease, neuroendocrine tumor, fatigue, taking modafinil, maximizing protein, discussed his diet thoroughly, encouraged high protein foods, discussed dietary supplements and foods for brain health.

## 2022-10-14 NOTE — Patient Instructions (Signed)
Balance of Natures Fruits and Veggies  Blueberries, walnuts, fish,   Pre-Dx  Green leafy vegetables, beans and legumes,

## 2022-10-18 ENCOUNTER — Inpatient Hospital Stay: Payer: Medicare (Managed Care) | Attending: Physician Assistant

## 2022-10-18 ENCOUNTER — Other Ambulatory Visit: Payer: Self-pay

## 2022-10-18 VITALS — BP 132/73 | HR 69 | Temp 99.6°F | Resp 16

## 2022-10-18 DIAGNOSIS — C7B8 Other secondary neuroendocrine tumors: Secondary | ICD-10-CM | POA: Insufficient documentation

## 2022-10-18 DIAGNOSIS — C7A8 Other malignant neuroendocrine tumors: Secondary | ICD-10-CM | POA: Diagnosis not present

## 2022-10-18 MED ORDER — OCTREOTIDE ACETATE 30 MG IM KIT
30.0000 mg | PACK | Freq: Once | INTRAMUSCULAR | Status: AC
  Administered 2022-10-18: 30 mg via INTRAMUSCULAR
  Filled 2022-10-18: qty 1

## 2022-10-20 ENCOUNTER — Other Ambulatory Visit: Payer: Self-pay

## 2022-10-20 ENCOUNTER — Other Ambulatory Visit: Payer: Self-pay | Admitting: Adult Health

## 2022-10-20 MED ORDER — QUETIAPINE FUMARATE 25 MG PO TABS: 25.0000 mg | ORAL_TABLET | Freq: Every day | ORAL | 0 refills | Status: DC

## 2022-10-21 DIAGNOSIS — G4733 Obstructive sleep apnea (adult) (pediatric): Secondary | ICD-10-CM | POA: Diagnosis not present

## 2022-10-28 ENCOUNTER — Ambulatory Visit (INDEPENDENT_AMBULATORY_CARE_PROVIDER_SITE_OTHER): Payer: Medicare (Managed Care)

## 2022-10-28 DIAGNOSIS — I495 Sick sinus syndrome: Secondary | ICD-10-CM | POA: Diagnosis not present

## 2022-10-28 LAB — CUP PACEART REMOTE DEVICE CHECK
Battery Voltage: 50
Date Time Interrogation Session: 20231224081114
Implantable Lead Connection Status: 753985
Implantable Lead Connection Status: 753985
Implantable Lead Implant Date: 20170525
Implantable Lead Implant Date: 20170525
Implantable Lead Location: 753859
Implantable Lead Location: 753860
Implantable Lead Model: 377
Implantable Lead Model: 377
Implantable Lead Serial Number: 49470224
Implantable Lead Serial Number: 49485613
Implantable Pulse Generator Implant Date: 20170525
Pulse Gen Model: 394969
Pulse Gen Serial Number: 68798589

## 2022-11-07 ENCOUNTER — Other Ambulatory Visit: Payer: Self-pay | Admitting: Physical Medicine and Rehabilitation

## 2022-11-07 ENCOUNTER — Other Ambulatory Visit: Payer: Self-pay | Admitting: Adult Health

## 2022-11-12 DIAGNOSIS — L03032 Cellulitis of left toe: Secondary | ICD-10-CM | POA: Diagnosis not present

## 2022-11-12 DIAGNOSIS — L600 Ingrowing nail: Secondary | ICD-10-CM | POA: Diagnosis not present

## 2022-11-15 ENCOUNTER — Inpatient Hospital Stay: Payer: Medicare (Managed Care) | Attending: Physician Assistant

## 2022-11-15 ENCOUNTER — Other Ambulatory Visit: Payer: Self-pay

## 2022-11-15 VITALS — BP 129/87 | HR 55 | Temp 98.6°F | Resp 16

## 2022-11-15 DIAGNOSIS — C7A8 Other malignant neuroendocrine tumors: Secondary | ICD-10-CM | POA: Insufficient documentation

## 2022-11-15 DIAGNOSIS — M9903 Segmental and somatic dysfunction of lumbar region: Secondary | ICD-10-CM | POA: Diagnosis not present

## 2022-11-15 DIAGNOSIS — C7B8 Other secondary neuroendocrine tumors: Secondary | ICD-10-CM | POA: Insufficient documentation

## 2022-11-15 DIAGNOSIS — M9905 Segmental and somatic dysfunction of pelvic region: Secondary | ICD-10-CM | POA: Diagnosis not present

## 2022-11-15 DIAGNOSIS — M5136 Other intervertebral disc degeneration, lumbar region: Secondary | ICD-10-CM | POA: Diagnosis not present

## 2022-11-15 DIAGNOSIS — M9904 Segmental and somatic dysfunction of sacral region: Secondary | ICD-10-CM | POA: Diagnosis not present

## 2022-11-15 MED ORDER — OCTREOTIDE ACETATE 30 MG IM KIT
30.0000 mg | PACK | Freq: Once | INTRAMUSCULAR | Status: AC
  Administered 2022-11-15: 30 mg via INTRAMUSCULAR
  Filled 2022-11-15: qty 1

## 2022-11-15 NOTE — Progress Notes (Signed)
Remote pacemaker transmission.   

## 2022-11-21 DIAGNOSIS — G4733 Obstructive sleep apnea (adult) (pediatric): Secondary | ICD-10-CM | POA: Diagnosis not present

## 2022-11-23 ENCOUNTER — Encounter: Payer: Self-pay | Admitting: Neurology

## 2022-11-23 ENCOUNTER — Ambulatory Visit: Payer: Medicare (Managed Care) | Admitting: Neurology

## 2022-11-23 ENCOUNTER — Encounter: Payer: Self-pay | Admitting: Hematology

## 2022-11-23 VITALS — BP 95/59 | HR 71 | Ht 68.0 in

## 2022-11-23 DIAGNOSIS — G214 Vascular parkinsonism: Secondary | ICD-10-CM

## 2022-11-23 DIAGNOSIS — Z8673 Personal history of transient ischemic attack (TIA), and cerebral infarction without residual deficits: Secondary | ICD-10-CM | POA: Diagnosis not present

## 2022-11-23 MED ORDER — CARBIDOPA-LEVODOPA ER 50-200 MG PO TBCR: 1.0000 | EXTENDED_RELEASE_TABLET | Freq: Every day | ORAL | 3 refills | Status: DC

## 2022-11-23 NOTE — Progress Notes (Signed)
[facility] [address] [place]. [place] [address] [phone]       OFFICE FOLLOW UP NOTE  [name] Date of Birth:  1941-09-27 Medical Record Number:  [id number]   Primary neurologist: Dr. 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      SUBJECTIVE:   Update 11/23/2022: He returns for follow-up after last visit 3 months ago.  He is accompanied by his wife.  They have noticed slight improvement after increasing the Sinemet dose to 25/101 and half tablets 3 times daily.  He has noticed improvement in his mobility and does not feel as weak or fatigued.  He denies any increased side effects in the form of nausea, dizziness but does have mild sleepiness.  He denies any delusions or hallucinations.  Still needs help with many activities of daily living but he can feed himself and clean himself In the toilet.  He complains of fatigability and he does use his CPAP regularly but has a appointment coming up to see Dr. Annamaria Boots denies any recurrent symptoms of stroke TIA.  His blood pressure tends to be on the low side and he denies he is short distances.  He does agree to some slight increase in drooling particularly in the morning.  He remains on Xarelto for his DVT and is tolerating it well without bruising or bleeding.  Sleep well most of the night. Update 08/19/22 : He returns for follow-up after last visit with [place] practitioner 1 month ago.  He is accompanied by his wife.  She states that for the last week or so she has noticed worsening of his parkinsonian symptoms.  He appears extremely fatigued and weak.  He has trouble walking down the hallway from the bedroom to the living room and has to stop.  At times he has difficult time getting started with any walks.  Walks with increased shuffling and small steps.  He has mild intermittent tremor but it is not as bothersome.   Patient had been on amantadine 200 mg daily but he was having some hallucinations so this was discontinued a week or 2 ago and may have contributed to his worsening.  He is currently on Sinemet IR 25/101 tablet 3 times daily and Sinemet CR 25/101 tablet at bedtime.  He seems to be tolerating these medications fairly well without significant dizziness, sleepiness or other side effects.  He has had no recent stroke or TIA symptoms.  He remains on Xarelto for DVT treatment. Update 07/13/2022 JM: Patient returns for follow up visit accompanied by his wife  He has been relatively stable from neurological standpoint over the past few months.  He has remained on Sinemet 1 tab 3 times daily and Sinemet CR 1 tab nightly. Tolerating without side effects. Does have some worsening stiffness and slowness pursing in the morning and in the evening/bedtime. Can have occasional difficulty with right leg hesitancy/coordination especially in the evening. Use of RW for short distance ambulation otherwise use of w/c.  Recently completed outpatient therapies.  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  He does try to maintain some ADLs independently but does need some assistance by his wife.  He does fatigue quickly after some ADLs such as bathing or dressing.  Previously having issues with visual hallucinations but these have greatly subsided since starting Seroquel 12.5 mg nightly, does still have some occasional visual  hallucinations but nonthreatening.  Does have frequent talking and acting out (punching and kicking) while sleeping.  He is on melatonin 10 mg nightly.  Admits to limited daytime physical activity, he usually sits and watches TV during the day, can fall asleep easily in chair unless watching something that keeps his interest. 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 Annamaria Boots back in March.  He does endorse  nightly use of CPAP.   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  He was discharged to SNF for deconditioning.  He underwent kyphoplasty on 2/28 for lumbar compression fracture by Dr. Estanislado Pandy  hospitalized again 3/1 for generalized abdominal pain and frequent falls, discharged to CIR on 3/10. During CIR stay, he was started on amantadine to help with activation and remained on low-dose Seroquel to help with sleep and decrease nighttime hallucinations.  Suffered a closed left hip fracture post fall 01/2022 and underwent surgical repair.  He was discharged to SNF rehab and returned home on 6/8.  Diagnosed with left common femoral vein, SF junction, femoral vein, proximal profunda vein, popliteal vein and posterior tibial vein DVT on 6/28. Placed on Xarelto with outpatient vascular surgery follow-up. Had issues with hematuria, improved after lowering dose to therapeutic dose but still having issues.  Recently transition to Lovenox for upcoming liver biopsy, wife reports resolution of discolored urine since stopping Xarelto.  Plans on pursuing cystoscopy in the near future.       History provided for reference purposes only Update 12/08/2021 JM: Returns for follow-up visit after prior visit 3 months ago for concern of worsening gait and functioning. Hx of prior stroke and parkinsonism. At prior visit, adjusted Sinemet dosage - currently on 25-100 1 tab 3x daily and ER 25-100 nightly. Per patient and wife, gait has remained the same - denies worsening but also denies any improvement. Tolerating Sinemet without side effects but no noticeable difference since dose change 3 months ago. Recently seen by Dr. Nelva Bush for lower back issues and plans to start PT for primarily lumbar spine on 3/2. Continues to use RW  but limited ambulate and only short distance. Does c/o neck and shoulder pain which is chronic but gradually worsening. Limited neck ROM. Denies radiculopathy. No prior cervical imaging. At times, will have difficulty holding head up and sits in a hunched over position. He will have difficulty holding on to small, especially lighter objects, right > left since stroke. C/o shortness of breath after talking for prolonged periods of time which has been present over the past 4-5 months. Denies SOB on exertion but wife believes that he does. Wife also notes voice being softer. Denies any swallowing difficulties. Does have chronic diabetic neuropathy with some worsening over the past 6 months. Recent A1c satisfactory. BUE tremors stable. He also c/o continued fatigue.   Stable from stroke standpoint without new stroke/TIA symptoms. Compliant on Plavix and atorvastatin without side effects.  Blood pressure today 112/75. Recent lipid panel satisfactory.   No further concerns at this time   Update 09/14/2021 JM: Returns for sooner scheduled visit accompanied by his wife.  Concern regarding worsening gait and functioning since prior visit. He did have a fall on 8/23 with subsequent left acetabular fracture - ortho eval recommended nonoperative management.  Admitted to SNF rehab and discharged on 9/23. Reports since he returned home from rehab, gait has gradually worsened (increased shuffling and decreased step height) and has been requiring more assistance.  Working with [facility] PT initially 2x weekly now only 1x per week. Suffered a fall 10/29. Eval at [facility] walk-in clinic with xray showing possible compression fracture per wife report (unable to personally view via epic).  He is currently awaiting eval by orthopedics. He has had f/u with ortho for hip fx since returning home and f/u visit scheduled end of this month. Per wife, currently limited ambulation and exercise - will only walk to go to the bathroom. PT currently  on hold until further eval with our office and ortho.   Wife apparently increased Sinemet to 3 times daily (currently taking at 10am, 4pm and 10pm) approx 1 month ago. Per wife, slight improvement of gait and functioning since increase during the day but does have worsening gait and stiffness upon awakening. Follows with Dr. Annamaria Boots for CPAP and narcolepsy and REM sleep behavior disorder-started on Klonopin 0.'5mg'$  nightly 10/25 which has helped. Also reports hallucinations during SNF rehab stay - this has since improved.  Wife questions if Sinemet dosage needs to be adjusted  Eval by Dr. Rexene Alberts for second opinion of gait disorder and wife concern of Parkinson's disease. 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    Update 05/12/2021 Dr. Leonie Man; He returns for follow-up after last visit with [name] nurse practitioner in April 2022.  Is accompanied by his wife.  Patient has had no recurrent stroke or TIA symptoms.  He continues to have mild dizziness and imbalance in the right side paresthesias and diminished fine motor skills.  He occasionally has tingling of his fingers or his feet but these are not bothersome.  He is quite active and works out 3 days a week at [employer].  He remains on Plavix which is tolerating well without bruising or bleeding.  He takes Lipitor 20 mg daily and is tolerating it well.  He states his primary care physician did check his lipid profile and A1c about 5 weeks ago but I do not have those results and he was told they were satisfactory.  He does have chronic low back pain and also complains of fatigue and tiredness.  He has not discussed this with primary physician had any work-up done for this.  Patient has history of parkinsonian tremor and has a prescription for Sinemet 25/103 times daily but states that he gets it only once a day or so as he forgets to take it  and other times.  Tremor seems intermittent and not constant and is not functionally disabling.  Wife feels that he does have a shuffling gait particularly when he is tired.  He however denies significant bradykinesia and is able to get up and walk by himself easily without assistance.  He denies drooling of saliva or micrographia.  He has not had any particular worsening of his tremors or other parkinsonian symptoms in recent months.    Update 02/19/2021 JM: Mr. Pressnell returns for acute visit per wife request due to recent onset of increased fatigue  Known hx of daytime fatigue with known OSA on CPAP and narcolepsy. Per wife, over the past 3 to 4 days, he has been experiencing increased daytime sleepiness where he is sleeping majority of the day as well as sleeping well  at night.  Reports PCP recently changed Adderall IR to XR for narcolepsy but will occasionally miss a dose.  Also reports recently being started on sertraline 50 mg daily on 4/14 which he will take in the morning.  He also recently started PT on Monday for gait impairment and BPPV.  No other associated symptoms such as confusion, worsening cognition, weakness, numbness/tingling, speech deficit, visual changes or gait changes.  No further concerns at this time   Update 11/27/2020 JM: Mr. Hoar returns per patient request for sooner scheduled visit due to continued gait impairment and lower back pain.  He is currently unaccompanied.  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  Tremors have been stable without worsening.  He does feel as though his ambulation is worse with increased back pain. He did have a mechanical fall seen in ED on 11/05/2020 thankfully without injury.   He does have chronic back pain previously doing routine exercise prior to his stroke but has not returned back to routine exercise since. He admits to sedentary life style with limited physical activity. He will fall  asleep easily during the day watching TV and occasional issues with insomnia. He will take adderral '10mg'$  daily for narcolepsy and will take additional tablet as needed during the day.  No further concerns at this time.  Update 09/15/2020 JM: Mr. Dreibelbis returns for stroke follow-up accompanied by his wife.  He has been doing well since prior visit with residual decreased right hand dexterity and decreased sensory right fingertips.  Denies new or worsening stroke/TIA symptoms. He does complain of short-term memory loss such as forgetting an actors name or random fact.  He also continues to experience R>L upper extremity tremors and gait impairment with imbalance with fall in 06/2020 resulting in nasal bone fracture.  Wife reports shuffling type gait when fatigued.  Remains on Plavix and atorvastatin for secondary stroke prevention without side effects.  Blood pressure today 126/73.  Glucose level stable per patient.  Reports ongoing compliance with CPAP for OSA management managed by pulmonology Dr. Annamaria Boots.  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  He complains of excessive daytime fatigue, vivid dreams and occasional insomnia. Per patient, he has not trialed any other medication for narcolepsy.  No further concerns at this time.  Update 05/15/2020 JM: Mr. Oberholzer is being seen for hospital follow-up accompanied by his wife.  He was discharged home from CIR on 04/29/2020 after 11 day stay.  Residual deficits of mild imbalance and mildly decreased sensation R finger tips and decreased right hand fine motor skills. Does report ongoing improvement currently working with home health PT. he has had gait impairment ongoing and right hand decreased fine motor control with tremors and was evaluated by Dr. Rexene Alberts on 12/11/2019 for possible Parkinson's but was not felt this was Parkinson's related.  He does continue to use cane for ambulation and denies any recent falls.  He is  questioning return to driving as he was advised no driving at discharge until further cleared.  Completed 3 weeks DAPT and continues on Plavix alone without bleeding or bruising.  Continues on atorvastatin 40 mg daily without myalgias.  Blood pressure today 118/84.  Glucose levels stable.  Endorses ongoing use of CPAP for OSA management.  No further concerns at this time.   Stroke admission 04/14/2020 Jordan [name] is a 82 y.o. male with history of  diabetes, stroke,  hypertension, hyperlipidemia, permanent cardiac pacemaker  who presented on 04/14/2020 with R sided decreased sensation.  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  Previously on clopidogrel and recommended DAPT for 3 weeks then clopidogrel alone.  Presented in hypertensive urgency with BP 204/116 and recommended long-term BP goal normotensive range.  LDL 57 and recommended continuation of atorvastatin 40 mg daily.  Uncontrolled DM with A1c 7.9.  Other stroke risk factors include prior stroke 01/2018 with multifocal punctate infarcts concerning for embolic pattern, former tobacco use, EtOH use, obesity, CAD, OSA on CPAP and permanent cardiac pacemaker.  Evaluated by therapies who recommended CIR for residual deficits of imbalance and safety affecting ADLs and mobility.  Discharged to CIR on 04/18/2020.  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 Small vessel disease. Sinus dz. ASPECTS 10.    CTA head & neck no LVO. Aortic atherosclerosis.  MRI  L thalamic infarct. Small vessel disease. Atrophy.  2D Echo EF 65-70%. No source of embolus. LA mildly dilated.  Pacemaker interrogation no afib LDL 57 -continued atorvastatin 40 mg daily HgbA1c 7.9 Lovenox 40 mg sq daily for VTE prophylaxis clopidogrel 75 mg daily prior to admission, now on aspirin 81 mg daily and clopidogrel 75 mg daily. Continue DAPT x 3 weeks then plavix alone    Therapy recommendations:  CIR Disposition:  CIR      ROS:   14 system review of systems performed and negative with exception of those listed in HPI  PMH:  Past Medical History:  Diagnosis Date   Arthritis    R shoulder, bone spur   Arthritis    "hands" (03/25/2016)   Chronic diastolic heart failure (HCC)    Chronic lower back pain    Coronary heart disease    Dr Pernell Dupre   Diverticulitis 12/2021   DVT (deep venous thrombosis) ([place]) 05/2022   left leg   H/O cardiovascular stress test    perhaps last one was 2009   Heart murmur    12/29/11 echo: mild MR, no AS, trivial TR   History of gout    Hyperlipidemia    Hypertension    saw Dr. Linard Millers last earl;y- 2014, cardiac cath. last done ?2009, blocks seen didn't require any intervention at that point.   Narcolepsy    [name] 04-11-97; Mean Latency 1.51mn, SOREM 2   OSA on CPAP    NPSG 12-13-98 AHI 22.7   PONV (postoperative nausea and vomiting)    Presence of permanent cardiac pacemaker    Shortness of breath    with exertion    Stroke ([facility]    Type II diabetes mellitus ([facility]     PSH:  Past Surgical History:  Procedure Laterality Date   CARDIAC CATHETERIZATION  2009?   EP IMPLANTABLE DEVICE N/A 03/25/2016   Procedure: Pacemaker Implant - Dual Chamber;  Surgeon: GEvans Lance MD;  [facility];  Service: Cardiovascular;  Laterality: N/A;   FRACTURE SURGERY     HIP FRACTURE SURGERY     INSERT / REPLACE / REMOVE PACEMAKER  03/24/2016   INTRAMEDULLARY (IM) NAIL INTERTROCHANTERIC Left 02/08/2022   Procedure: INTRAMEDULLARY (IM) NAIL INTERTROCHANTRIC;  Surgeon: DMeredith Pel MD;  [facility]  Service: Orthopedics;  Laterality: Left;   IR KYPHO LUMBAR INC FX REDUCE BONE BX UNI/BIL CANNULATION INC/IMAGING  12/29/2021   IR RADIOLOGIST EVAL & MGMT  12/15/2021  IR RADIOLOGY PERIPHERAL GUIDED IV START  07/14/2022   IR US GUIDE VASC ACCESS RIGHT  07/15/2022   LEFT AND RIGHT HEART CATHETERIZATION  WITH CORONARY ANGIOGRAM N/A 06/05/2014   Procedure: LEFT AND RIGHT HEART CATHETERIZATION WITH CORONARY ANGIOGRAM;  Surgeon: Sinclair Grooms, MD;  [facility] LAB;  Service: Cardiovascular;  Laterality: N/A;   NASAL FRACTURE SURGERY     SHOULDER ARTHROSCOPY WITH ROTATOR CUFF REPAIR AND SUBACROMIAL DECOMPRESSION Right 08/16/2013   Procedure: SHOULDER ARTHROSCOPY WITH ROTATOR CUFF REPAIR AND SUBACROMIAL DECOMPRESSION;  Surgeon: [name], MD;  Location: [place];  Service: Orthopedics;  Laterality: Right;  Right shoulder arthroscopy rotator cuff repair, subacromial decompression.   TONSILLECTOMY      Social History:  Social History   Socioeconomic History   Marital status: Married    Spouse name: [name]   Number of children: Not on file   Years of education: Not on file   Highest education level: Not on file  Occupational History   Occupation: [employer]   Occupation: [place] [employer]  Tobacco Use   Smoking status: Former    Packs/day: 2.50    Years: 6.00    Total pack years: 15.00    Types: Cigarettes    Quit date: 06/05/1967    Years since quitting: 55.5   Smokeless tobacco: Never  Vaping Use   Vaping Use: Never used  Substance and Sexual Activity   Alcohol use: Yes    Comment: rare   Drug use: No   Sexual activity: Not Currently  Other Topics Concern   Not on file  Social History Narrative   Lives with wife and son   Right handed   Drinks 1 cups caffeine daily   Social Determinants of Health   Financial Resource Strain: Not on file  Food Insecurity: Not on file  Transportation Needs: Not on file  Physical Activity: Not on file  Stress: Not on file  Social Connections: Not on file  Intimate Partner Violence: Not on file    Family History:  Family History  Problem Relation Age of Onset   Sleep apnea Sister    Colon cancer Sister     Medications:   Current Outpatient Medications on File Prior to Visit  Medication Sig Dispense  Refill   acetaminophen (TYLENOL) 500 MG tablet Take 500-1,000 mg by mouth every 6 (six) hours as needed for moderate pain or mild pain.     atorvastatin (LIPITOR) 40 MG tablet Take 0.5 tablets (20 mg total) by mouth daily. 30 tablet 0   bifidobacterium infantis (ALIGN) capsule Take 1 capsule by mouth daily.     Camphor-Menthol-Methyl Sal (HM SALONPAS PAIN RELIEF EX) Apply 1 patch topically daily as needed (Back pain).     carbidopa-levodopa (SINEMET IR) 25-100 MG tablet Take 1.5 tablets by mouth 3 (three) times daily. 180 tablet 2   Carbidopa-Levodopa ER (SINEMET CR) 25-100 MG tablet controlled release TAKE 1 TABLET BY MOUTH EVERYDAY AT BEDTIME 30 tablet 0   cholecalciferol (VITAMIN D3) 25 MCG (1000 UNIT) tablet Take 1,000 Units by mouth daily.     clotrimazole-betamethasone (LOTRISONE) cream Apply 1 Application topically daily as needed (Rash).     docusate sodium (COLACE) 100 MG capsule Take 1 capsule (100 mg total) by mouth 2 (two) times daily. (Patient taking differently: Take 100 mg by mouth daily as needed for mild constipation or moderate constipation. As Needed) 10 capsule 0   feeding supplement, GLUCERNA SHAKE, (GLUCERNA SHAKE) LIQD Take 237 mLs  by mouth 2 (two) times daily between meals. Alternate with ensure Diabetic     furosemide (LASIX) 20 MG tablet USE AS NEEDED FOR UP TO 5 DAYS IF SWELLING IN LEGS RECURS. CONTACT CARDIOLOGY IS SWELLING DOES NOT IMPROVE OR GETS WORSE. 15 tablet 0   lidocaine (LIDODERM) 5 % Place 1 patch onto the skin daily as needed. Purchase over the counter. On for 12 hours and off for 12 hours 30 patch 0   magnesium gluconate (MAGONATE) 500 MG tablet Take 0.5 tablets (250 mg total) by mouth at bedtime. 30 tablet 0   Melatonin 10 MG TABS Take 10 mg by mouth at bedtime.     metFORMIN (GLUCOPHAGE) 1000 MG tablet Take 1 tablet (1,000 mg total) by mouth daily with breakfast. (Patient taking differently: Take 500 mg by mouth 3 (three) times daily.) 30 tablet 0    metoprolol tartrate (LOPRESSOR) 25 MG tablet Take 1 tablet (25 mg total) by mouth 2 (two) times daily. (Patient taking differently: Take 12.5 mg by mouth 2 (two) times daily.) 60 tablet 0   modafinil (PROVIGIL) 200 MG tablet Take 1 tablet (200 mg total) by mouth daily. 30 tablet 5   Multiple Vitamin (MULTIVITAMIN WITH MINERALS) TABS tablet Take 1 tablet by mouth daily.     NITROSTAT 0.4 MG SL tablet PLACE 1 TABLET (0.4 MG TOTAL) UNDER THE TONGUE EVERY 5 (FIVE) MINUTES AS NEEDED FOR CHEST PAIN. (Patient taking differently: Place 0.4 mg under the tongue every 5 (five) minutes as needed for chest pain.) 25 tablet 5   Omega-3 Fatty Acids (OMEGA 3 PO) Take 1,280 mg by mouth daily.     polyethylene glycol (MIRALAX / GLYCOLAX) 17 g packet Take 17 g by mouth daily. 14 each 0   polyvinyl alcohol (LIQUIFILM TEARS) 1.4 % ophthalmic solution Place 1 drop into both eyes as needed for dry eyes. (Patient taking differently: Place 1 drop into both eyes daily as needed for dry eyes.) 15 mL 0   QUEtiapine (SEROQUEL) 25 MG tablet Take 1 tablet (25 mg total) by mouth at bedtime. 30 tablet 0   Rivaroxaban (XARELTO) 15 MG TABS tablet Take 1 tablet (15 mg total) by mouth daily with supper. 90 tablet 1   Vitamin D, Ergocalciferol, (DRISDOL) 1.25 MG (50000 UNIT) CAPS capsule TAKE 1 CAPSULE (50,000 UNITS TOTAL) BY MOUTH EVERY 7 (SEVEN) DAYS 5 capsule 0   No current facility-administered medications on file prior to visit.    Allergies:   Allergies  Allergen Reactions   Amantadines Other (See Comments)    Mental changes   Clonazepam Other (See Comments)    Mental changes   Zoloft [Sertraline] Other (See Comments)    Mental changes      OBJECTIVE:  Physical Exam  Vitals:   11/23/22 1425 11/23/22 1430  BP: (!) 82/50 (!) 95/59  Pulse: 71 71  Height: '5\' 8"'$  (1.727 m)     Body mass index is 33.75 kg/m. No results found.  General: Obese , very pleasant elderly Caucasian male, seated, in no evident  distress Head: head normocephalic and atraumatic.   Neck: supple with no carotid or supraclavicular bruits Cardiovascular: regular rate and rhythm, no murmurs Musculoskeletal: limited cervical and b/l shoulder ROM Skin:  no rash/petichiae Vascular:  Normal pulses all extremities   Neurologic Exam Mental Status: Awake and fully alert.  Moderately diminished facial expression.  Mild to moderate hypophonia.  Unable to appreciate dysarthria or aphasia.  Oriented to place and time. Recent and remote memory  intact. Attention span, concentration and fund of knowledge appropriate. Mood and affect appropriate.  Decreased facial expression.  Positive glabellar tap. Cranial Nerves: Pupils equal, briskly reactive to light. Extraocular movements full without nystagmus. Visual fields full to confrontation. Hearing intact. Facial sensation intact. Face, tongue, palate moves normally and symmetrically.  Motor: Normal bulk and tone. Normal strength in all tested extremity muscles except mild bilateral hip flexor weakness.  Diminished fine finger movements on the right and orbits left over right upper extremity.  .  Tremor absent at rest.  Cogwheel rigidity upon activation only right greater than left hand. Sensory.: intact to touch , pinprick , position and vibratory sensation.  Coordination: Rapid alternating movements normal in all extremities except slightly decreased right hand. Finger-to-nose and heel-to-shin performed accurately bilaterally.   Mild B/l R>L UE postural tremor with mild R cogwheel rigidity. Mild R>L bradykinesia.   Gait and Station: .  Deferred as patient did not get his walker.   Reflexes: 1+ and symmetric. Toes downgoing.        ASSESSMENT/PLAN: Jordan [name] is a 82 y.o. 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  S/p left acetabular fracture 06/23/2021 after a mechanical fall with worsening gait and increased need for assistance since return home from [facility] rehab and recurrent fall with resultant left hip fracture 01/2022 requiring surgical procedure.  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  He also has vascular parkinsonism which has shown some response to Sinemet    I had a long discussion with the patient and his wife regarding his vascular parkinsonism, parietal stroke and answered questions.  I recommend continue Sinemet IR 1.5 tablets 3 times daily but increase current dose of Sinemet CR to 50/200 mg 1 tablet daily at bedtime.  I have discussed possible side effects with them and advised them to call me if needed.  Continue Xarelto for DVT and stroke prevention and maintain aggressive risk factor modification with strict control of diabetes with hemoglobin A1c goal below 6.5%, LDL cholesterol goal below 70 mg percent and hypertension with blood pressure goal below 130/90.  He was advised to use his walker at all times we also discussed fall afety precautions.Return for follow-up in the future in 3 months with [name] nurse patient will call earlier if needed.  Greater than 50% time during this 35-minute visit was spent on counseling and coordination of care about his vascular parkinsonism and stroke and answering questions  Antony Contras, MD  [facility] [address] [place], [place] [address]  Phone [phone] Fax [phone] Note: This document was prepared with digital dictation and possible smart phrase technology. Any transcriptional errors that result from this process are unintentional.

## 2022-11-23 NOTE — Patient Instructions (Addendum)
I had a long discussion with the patient and his wife regarding his vascular parkinsonism, parietal stroke and answered questions.  I recommend continue Sinemet IR 1.5 tablets 3 times daily but increase current dose of Sinemet CR to 50/200 mg 1 tablet daily at bedtime.  I have discussed possible side effects with them and advised them to call me if needed.  Continue Xarelto for DVT and stroke prevention and maintain aggressive risk factor modification with strict control of diabetes with hemoglobin A1c goal below 6.5%, LDL cholesterol goal below 70 mg percent and hypertension with blood pressure goal below 130/90.  He was advised to use his walker at all times we also discussed fall afety precautions.Return for follow-up in the future in 3 months with [name] nurse patient will call earlier if needed.

## 2022-11-25 DIAGNOSIS — L600 Ingrowing nail: Secondary | ICD-10-CM | POA: Diagnosis not present

## 2022-11-29 DIAGNOSIS — R319 Hematuria, unspecified: Secondary | ICD-10-CM | POA: Diagnosis not present

## 2022-11-29 DIAGNOSIS — N200 Calculus of kidney: Secondary | ICD-10-CM | POA: Diagnosis not present

## 2022-11-29 DIAGNOSIS — N281 Cyst of kidney, acquired: Secondary | ICD-10-CM | POA: Diagnosis not present

## 2022-11-29 DIAGNOSIS — N400 Enlarged prostate without lower urinary tract symptoms: Secondary | ICD-10-CM | POA: Diagnosis not present

## 2022-11-30 DIAGNOSIS — M9904 Segmental and somatic dysfunction of sacral region: Secondary | ICD-10-CM | POA: Diagnosis not present

## 2022-11-30 DIAGNOSIS — M9903 Segmental and somatic dysfunction of lumbar region: Secondary | ICD-10-CM | POA: Diagnosis not present

## 2022-11-30 DIAGNOSIS — M5136 Other intervertebral disc degeneration, lumbar region: Secondary | ICD-10-CM | POA: Diagnosis not present

## 2022-11-30 DIAGNOSIS — M9905 Segmental and somatic dysfunction of pelvic region: Secondary | ICD-10-CM | POA: Diagnosis not present

## 2022-12-09 DIAGNOSIS — L600 Ingrowing nail: Secondary | ICD-10-CM | POA: Diagnosis not present

## 2022-12-10 DIAGNOSIS — G4730 Sleep apnea, unspecified: Secondary | ICD-10-CM | POA: Diagnosis not present

## 2022-12-10 DIAGNOSIS — E1169 Type 2 diabetes mellitus with other specified complication: Secondary | ICD-10-CM | POA: Diagnosis not present

## 2022-12-10 DIAGNOSIS — I10 Essential (primary) hypertension: Secondary | ICD-10-CM | POA: Diagnosis not present

## 2022-12-10 DIAGNOSIS — I679 Cerebrovascular disease, unspecified: Secondary | ICD-10-CM | POA: Diagnosis not present

## 2022-12-10 DIAGNOSIS — E1151 Type 2 diabetes mellitus with diabetic peripheral angiopathy without gangrene: Secondary | ICD-10-CM | POA: Diagnosis not present

## 2022-12-10 DIAGNOSIS — Z950 Presence of cardiac pacemaker: Secondary | ICD-10-CM | POA: Diagnosis not present

## 2022-12-10 DIAGNOSIS — G47419 Narcolepsy without cataplexy: Secondary | ICD-10-CM | POA: Diagnosis not present

## 2022-12-10 DIAGNOSIS — G214 Vascular parkinsonism: Secondary | ICD-10-CM | POA: Diagnosis not present

## 2022-12-10 DIAGNOSIS — E7800 Pure hypercholesterolemia, unspecified: Secondary | ICD-10-CM | POA: Diagnosis not present

## 2022-12-10 DIAGNOSIS — I5032 Chronic diastolic (congestive) heart failure: Secondary | ICD-10-CM | POA: Diagnosis not present

## 2022-12-10 DIAGNOSIS — D3A8 Other benign neuroendocrine tumors: Secondary | ICD-10-CM | POA: Diagnosis not present

## 2022-12-12 NOTE — Assessment & Plan Note (Deleted)
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 -PET DOTATATE from 08/12/22 showed avid disease within the liver, mesentery and small bowel. 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 He tolerated well with no noticeable side effects.

## 2022-12-12 NOTE — Assessment & Plan Note (Deleted)
-  Found in June 2023, related to his underlying malignancy and limited mobility -Recommend anticoagulation lifelong, at 15 mg daily due to history of intermittent hematuria.

## 2022-12-13 ENCOUNTER — Inpatient Hospital Stay: Payer: Medicare (Managed Care) | Admitting: Hematology

## 2022-12-13 ENCOUNTER — Inpatient Hospital Stay: Payer: Medicare (Managed Care)

## 2022-12-13 DIAGNOSIS — I824Y2 Acute embolism and thrombosis of unspecified deep veins of left proximal lower extremity: Secondary | ICD-10-CM

## 2022-12-13 DIAGNOSIS — C7B8 Other secondary neuroendocrine tumors: Secondary | ICD-10-CM

## 2022-12-13 NOTE — Progress Notes (Deleted)
[place]   [phone] [phone]   Clinic Follow up Note   Patient Care Team: [name], MD as PCP - General (Family Medicine) [name], MD (Family Medicine) Truitt Merle, MD as Consulting Physician (Oncology)  Date of Service:  12/13/2022  CHIEF COMPLAINT: f/u of metastatic NET   CURRENT THERAPY:   Sandostatin injections, q28d, starting 08/17/22    ASSESSMENT: *** Jordan [name] is a 82 y.o. male with   No problem-specific Assessment & Plan notes found for this encounter.  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   EXAM: CT ABDOMEN AND PELVIS WITH CONTRAST  IMPRESSION: 1. 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 2. Hepatic lesion as discussed above in the posterior [name] hemiliver is suspicious for metastasis in this context. For all above findings in lieu of hepatic MRI, if the patient is not able to undergo hepatic MRI, biochemical correlation and DOTATATE PET may be helpful. 3. Bowel loop in the central abdomen with nodular enhancement approximately 8-9 mm along the wall in terms of greatest wall thickness. 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 4. Hepatic steatosis. 5. Resolution of inflammation about the pancreatic tail with nodular appearance of pancreatic tail. Attention on  subsequent imaging is suggested. This may reflect sequela of prior inflammation given improvement but underlying lesion in this location while not favored is not excluded.  ADDENDUM: For clarification, the central, ileal mesenteric lesion currently measuring 2.1 x 2.1 x 2.4 cm measured 1.6 x 1.8 x 2.1 cm in 2015.   Faintly visible area in the posterior [name] hemiliver in 2015 raising the question of possibility of small lesion at best 2 x 2.2 cm in the posterior [name] hemiliver this areas now clearly visible measuring 4.1 x 4.0 cm. Findings are compatible with indolent behavior. Indolent carcinoid remains the primary differential consideration. Tumor marker correlation is suggested with DOTATATE PET for further evaluation as warranted.   06/23/2022 Imaging   EXAM: MRI ABDOMEN WITHOUT AND WITH CONTRAST  IMPRESSION: 3.9 cm heterogeneous enhancing mass in segment 7 of the [name] hepatic lobe, which is nonspecific. Hepatic metastasis cannot be excluded.   Small benign hemangioma and cysts in the posterior [name] hepatic lobe.   Small [name] lower quadrant mesenteric mass or lymphadenopathy, without significant change.   Stable 2.1 cm cystic lesion in the pancreatic neck, with other tiny sub-cm cystic foci in the pancreatic head and body. Differential diagnosis includes pancreatic pseudocysts and indolent IPMNs. Recommend continued follow-up by MRI in 6 months. This recommendation follows ACR consensus guidelines: Management of Incidental Pancreatic Cysts: A White Paper of the ACR Incidental Findings Committee. J Am Coll Radiol [id number].   7 mm calculus in the left renal pelvis, with mild left pelvicaliectasis.   07/14/2022 Initial Biopsy   FINAL MICROSCOPIC DIAGNOSIS:   A. LIVER, [name], NEEDLE CORE BIOPSY:  -  Metastatic low-grade  neuroendocrine tumor (positive for chromogranin and synaptophysin).  -  Proliferation rate by Ki-67 less than 3%.    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)   07/23/2022 Cancer Staging   Staging form: Liver, AJCC 8th Edition - Clinical: Stage IVB (cTX, cNX, pM1) - Signed by Truitt Merle, MD on 07/23/2022      INTERVAL HISTORY: *** Jordan [name] is here for a follow up of  metastatic NET He was last seen by me on 09/17/2022 He presents to the clinic       All other systems were reviewed with the patient and are negative.  MEDICAL HISTORY:  Past Medical History:  Diagnosis Date   Arthritis    R shoulder, bone spur   Arthritis    "hands" (03/25/2016)   Chronic diastolic heart failure (HCC)    Chronic lower back pain    Coronary heart disease    Dr Pernell Dupre   Diverticulitis 12/2021   DVT (deep venous thrombosis) ([place]) 05/2022   left leg   H/O cardiovascular stress test    perhaps last one was 2009   Heart murmur    12/29/11 echo: mild MR, no AS, trivial TR   History of gout    Hyperlipidemia    Hypertension    saw Dr. Linard Millers last earl;y- 2014, cardiac cath. last done ?2009, blocks seen didn't require any intervention at that point.   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   EP IMPLANTABLE DEVICE N/A 03/25/2016   Procedure: Pacemaker Implant - Dual Chamber;  Surgeon: GEvans Lance MD;  [place];  Service: Cardiovascular;  Laterality: N/A;   FRACTURE SURGERY     HIP FRACTURE SURGERY     INSERT / REPLACE / REMOVE PACEMAKER  03/24/2016   INTRAMEDULLARY (IM) NAIL INTERTROCHANTERIC Left 02/08/2022   Procedure: INTRAMEDULLARY (IM) NAIL INTERTROCHANTRIC;  Surgeon: DMeredith Pel MD;  [place]  Service: Orthopedics;  Laterality: Left;   IR KYPHO LUMBAR INC FX REDUCE BONE BX UNI/BIL CANNULATION INC/IMAGING  12/29/2021   IR RADIOLOGIST EVAL & MGMT  12/15/2021   IR RADIOLOGY PERIPHERAL GUIDED IV START  07/14/2022   IR [name]  07/15/2022   LEFT AND [name] HEART CATHETERIZATION WITH CORONARY ANGIOGRAM N/A 06/05/2014   Procedure: LEFT AND [name] HEART CATHETERIZATION WITH CORONARY ANGIOGRAM;  Surgeon: HSinclair Grooms MD;  [facility];  Service: Cardiovascular;  Laterality: N/A;   NASAL FRACTURE SURGERY     SHOULDER ARTHROSCOPY WITH ROTATOR CUFF REPAIR AND SUBACROMIAL DECOMPRESSION [name] 08/16/2013   Procedure: SHOULDER ARTHROSCOPY WITH ROTATOR CUFF REPAIR AND SUBACROMIAL DECOMPRESSION;  Surgeon: JNita Sells MD;  [place]  Service: Orthopedics;  Laterality: [name];  [name] shoulder arthroscopy rotator cuff repair, subacromial decompression.   TONSILLECTOMY      I have reviewed the social history and family history with the patient and they are unchanged from previous note.  ALLERGIES:  is allergic to amantadines, clonazepam, and zoloft [sertraline].  MEDICATIONS:  Current Outpatient Medications  Medication Sig Dispense Refill   acetaminophen (TYLENOL) 500 MG tablet Take 500-1,000 mg by mouth every 6 (six) hours as needed for moderate pain or mild pain.     atorvastatin (LIPITOR) 40 MG tablet Take 0.5 tablets (20 mg total) by mouth daily. 30 tablet 0   bifidobacterium infantis (ALIGN) capsule Take 1 capsule by mouth daily.     Camphor-Menthol-Methyl Sal (HM SALONPAS PAIN RELIEF EX) Apply 1 patch topically daily as needed (Back pain).     carbidopa-levodopa (SINEMET CR) 50-200 MG tablet Take 1 tablet by mouth at bedtime. 30 tablet 3   carbidopa-levodopa (SINEMET IR) 25-100 MG tablet Take 1.5 tablets by mouth 3 (three) times daily. 180 tablet 2   cholecalciferol (VITAMIN D3) 25 MCG (1000 UNIT) tablet Take 1,000 Units by mouth daily.      clotrimazole-betamethasone (LOTRISONE) cream Apply 1 Application topically daily as needed (Rash).     docusate sodium (COLACE) 100 MG capsule Take 1 capsule (100 mg total) by mouth 2 (two) times daily. (Patient taking differently: Take 100 mg by mouth daily as needed for mild constipation or moderate constipation. As Needed) 10 capsule 0   feeding supplement, GLUCERNA SHAKE, (GLUCERNA SHAKE) LIQD Take 237 mLs by mouth 2 (two) times daily between meals. Alternate with ensure Diabetic     furosemide (LASIX) 20 MG tablet USE AS NEEDED FOR UP TO 5 DAYS IF SWELLING IN LEGS RECURS. CONTACT CARDIOLOGY IS SWELLING DOES NOT IMPROVE OR GETS WORSE. 15 tablet 0   lidocaine (LIDODERM) 5 % Place 1 patch onto the skin daily as needed. Purchase over the counter. On for 12 hours and off for 12 hours 30 patch 0   magnesium gluconate (MAGONATE) 500 MG tablet Take 0.5 tablets (250 mg total) by mouth at bedtime. 30 tablet 0   Melatonin 10 MG TABS Take 10 mg by mouth at bedtime.     metFORMIN (GLUCOPHAGE) 1000 MG tablet Take 1 tablet (1,000 mg total) by mouth daily with breakfast. (Patient taking differently: Take 500 mg by mouth 3 (three) times daily.) 30 tablet 0   metoprolol tartrate (LOPRESSOR) 25 MG tablet Take 1 tablet (25 mg total) by mouth 2 (two) times daily. (Patient taking differently: Take 12.5 mg by mouth 2 (two) times daily.) 60 tablet 0   modafinil (PROVIGIL) 200 MG tablet Take 1 tablet (200 mg total) by mouth daily. 30 tablet 5   Multiple Vitamin (MULTIVITAMIN WITH MINERALS) TABS tablet Take 1 tablet by mouth daily.     NITROSTAT 0.4 MG SL tablet PLACE 1 TABLET (0.4 MG TOTAL) UNDER THE TONGUE EVERY 5 (FIVE) MINUTES AS NEEDED FOR CHEST PAIN. (Patient taking differently: Place 0.4 mg under the tongue every 5 (five) minutes as needed for chest pain.) 25 tablet 5   Omega-3 Fatty Acids (OMEGA 3 PO) Take 1,280 mg by mouth daily.     polyethylene glycol (MIRALAX / GLYCOLAX) 17 g packet Take 17 g by mouth  daily. 14 each 0   polyvinyl alcohol (LIQUIFILM TEARS) 1.4 % ophthalmic solution Place 1 drop into both eyes as needed for dry eyes. (Patient taking differently: Place 1 drop into both eyes daily as needed for dry eyes.) 15 mL 0   QUEtiapine (SEROQUEL) 25 MG tablet Take 1 tablet (25 mg total) by mouth at bedtime. 30 tablet 0   Rivaroxaban (XARELTO) 15 MG TABS tablet Take 1 tablet (15 mg total) by mouth daily with supper. 90 tablet 1   Vitamin D,  Ergocalciferol, (DRISDOL) 1.25 MG (50000 UNIT) CAPS capsule TAKE 1 CAPSULE (50,000 UNITS TOTAL) BY MOUTH EVERY 7 (SEVEN) DAYS 5 capsule 0   No current facility-administered medications for this visit.    PHYSICAL EXAMINATION: ECOG PERFORMANCE STATUS: {CHL ONC ECOG [phone]}  There were no vitals filed for this visit. Wt Readings from Last 3 Encounters:  10/14/22 222 lb (100.7 kg)  09/16/22 220 lb (99.8 kg)  08/17/22 223 lb 4.8 oz (101.3 kg)    {Only keep what was examined. If exam not performed, can use .CEXAM } GENERAL:alert, no distress and comfortable SKIN: skin color, texture, turgor are normal, no rashes or significant lesions EYES: normal, Conjunctiva are pink and non-injected, sclera clear {OROPHARYNX:no exudate, no erythema and lips, buccal mucosa, and tongue normal}  NECK: supple, thyroid normal size, non-tender, without nodularity LYMPH:  no palpable lymphadenopathy in the cervical, axillary {or inguinal} LUNGS: clear to auscultation and percussion with normal breathing effort HEART: regular rate & rhythm and no murmurs and no lower extremity edema ABDOMEN:abdomen soft, non-tender and normal bowel sounds Musculoskeletal:no cyanosis of digits and no clubbing  NEURO: alert & oriented x 3 with fluent speech, no focal motor/sensory deficits  LABORATORY DATA:  I have reviewed the data as listed    Latest Ref Rng & Units 09/17/2022    1:49 PM 09/16/2022    4:03 PM 08/17/2022    3:09 PM  CBC  WBC 4.0 - 10.5 K/uL 10.4  10.2  8.5    Hemoglobin 13.0 - 17.0 g/dL 14.3  15.0  13.9   Hematocrit 39.0 - 52.0 % 42.6  46.0  42.0   Platelets 150 - 400 K/uL 257  241  255         Latest Ref Rng & Units 09/17/2022    1:49 PM 09/16/2022    4:03 PM 08/17/2022    3:09 PM  CMP  Glucose 70 - 99 mg/dL 308  222  337   BUN 8 - 23 mg/dL 27  20  29   $ Creatinine 0.61 - 1.24 mg/dL 1.08  0.99  1.01   Sodium 135 - 145 mmol/L 137  138  138   Potassium 3.5 - 5.1 mmol/L 4.3  4.3  4.5   Chloride 98 - 111 mmol/L 102  102  103   CO2 22 - 32 mmol/L 24  23  27   $ Calcium 8.9 - 10.3 mg/dL 9.9  9.7  9.8   Total Protein 6.5 - 8.1 g/dL 7.5   7.3   Total Bilirubin 0.3 - 1.2 mg/dL 0.5   0.4   Alkaline Phos 38 - 126 U/L 76   85   AST 15 - 41 U/L 20   22   ALT 0 - 44 U/L 8   6       RADIOGRAPHIC STUDIES: I have personally reviewed the radiological images as listed and agreed with the findings in the report. No results found.    No orders of the defined types were placed in this encounter.  All questions were answered. The patient knows to call the clinic with any problems, questions or concerns. No barriers to learning was detected. The total time spent in the appointment was {CHL ONC TIME VISIT - [id number].     Baldemar Friday, CMA 12/13/2022   I, Audry Riles, CMA, am acting as scribe for Truitt Merle, MD.   {Add scribe attestation statement}

## 2022-12-16 NOTE — Progress Notes (Signed)
[place]   [phone] Fax:([phone]   Clinic Follow up Note   Patient Care Team: [name], MD as PCP - General (Family Medicine) [name], MD (Family Medicine) Truitt Merle, MD as Consulting Physician (Oncology)  Date of Service:  12/17/2022  CHIEF COMPLAINT: f/u of metastatic NET   CURRENT THERAPY:  Sandostatin injections, q28d, starting 08/17/22      ASSESSMENT:  Jordan [name] is a 82 y.o. male with   Acute deep vein thrombosis (DVT) of left lower extremity ([place]) -Found in June 2023, related to his underlying malignancy and limited mobility -Recommend anticoagulation Xarelto lifelong, at 15 mg daily due to history of intermittent hematuria.  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 CT AP on 06/17/22 showed: calcified mesenteric mass with suspected associated small bowel mass; suspicious right hepatic lesion; nodular enhancement along wall of bowel loop in central abdomen.  -abdomen MRI 06/23/22 showed: 3.9 cm mass in right hepatic lobe; small RLQ mesenteric mass or lymphadenopathy. 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 -PET DOTATATE from 08/12/22 showed avid disease within the liver, mesentery and small bowel. 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  He reports worsening hypoglycemia, and decreased appetite, which are likely related to the injections.  We discussed the Sandostatin injection has limited antitumor effect, patient did not have significant flushing or diarrhea, did have some abdominal cramps when he was diagnosed.  I recommended him to stop Sandostatin injection, to see if his cramps gets worse, then we will decide if he needs to go back to Sandostatin injection with lower dose 20 mg every 4 to 6 weeks. -I also reviewed the other treatment options, such as Afinitor or Lutathera, if he has significant  disease progression.  2.  Stroke, Parkinson disease, hypertension, diabetes -Continue medical treatment, follow-up with his primary care physician and neurologist.   3. DVT of LLE -Found in June 2023, related to his underlying malignancy and limited mobility -Recommend anticoagulation lifelong, at 15 mg daily due to history of intermittent hematuria.   PLAN: -lab reviewed -Will stop her Sandostatin injection due to his hyperglycemia and low appetite -Discuss other Treatment options if he has significant disease progression in the future. 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   EXAM: CT ABDOMEN AND PELVIS WITH CONTRAST  IMPRESSION: 1. 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 2. Hepatic lesion as discussed above in the posterior RIGHT hemiliver is suspicious for metastasis in this context. For all above findings in lieu of hepatic MRI, if the patient is not able to undergo hepatic MRI, biochemical correlation and DOTATATE PET may be helpful. 3. Bowel loop in the central abdomen with nodular enhancement approximately 8-9 mm along the wall in terms of greatest wall thickness. 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 4. Hepatic  steatosis. 5. Resolution of inflammation about the pancreatic tail with nodular appearance of pancreatic tail.  Attention on subsequent imaging is suggested. This may reflect sequela of prior inflammation given improvement but underlying lesion in this location while not favored is not excluded.  ADDENDUM: For clarification, the central, ileal mesenteric lesion currently measuring 2.1 x 2.1 x 2.4 cm measured 1.6 x 1.8 x 2.1 cm in 2015.   Faintly visible area in the posterior RIGHT hemiliver in 2015 raising the question of possibility of small lesion at best 2 x 2.2 cm in the posterior RIGHT hemiliver this areas now clearly visible measuring 4.1 x 4.0 cm. Findings are compatible with indolent behavior. Indolent carcinoid remains the primary differential consideration. Tumor marker correlation is suggested with DOTATATE PET for further evaluation as warranted.   06/23/2022 Imaging   EXAM: MRI ABDOMEN WITHOUT AND WITH CONTRAST  IMPRESSION: 3.9 cm heterogeneous enhancing mass in segment 7 of the right hepatic lobe, which is nonspecific. Hepatic metastasis cannot be excluded.   Small benign hemangioma and cysts in the posterior right hepatic lobe.   Small right lower quadrant mesenteric mass or lymphadenopathy, without significant change.   Stable 2.1 cm cystic lesion in the pancreatic neck, with other tiny sub-cm cystic foci in the pancreatic head and body. Differential diagnosis includes pancreatic pseudocysts and indolent IPMNs. Recommend continued follow-up by MRI in 6 months. This recommendation follows ACR consensus guidelines: Management of Incidental Pancreatic Cysts: A White Paper of the ACR Incidental Findings Committee. J Am Coll Radiol [id number].   7 mm calculus in the left renal pelvis, with mild left pelvicaliectasis.   07/14/2022 Initial Biopsy   FINAL MICROSCOPIC DIAGNOSIS:   A. LIVER, RIGHT, NEEDLE CORE BIOPSY:  -  Metastatic low-grade neuroendocrine tumor (positive for chromogranin and synaptophysin).  -  Proliferation rate by Ki-67 less than 3%.    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 Pt reports  after reviewing the injection he had elevated glucose.  Pt went to see his PCP.Pt states of having an abdominal pain. Pt state his appetite is not the the best due to him not being hungry. Pt reports of having a BM every other day. Pt states he get dizzy from lying to sitting up. Pt wife state the Pt BP has been low off and on for months. Pt stated since starting the injection the pain is less.   All other systems were reviewed with the patient and are negative.  MEDICAL HISTORY:  Past Medical History:  Diagnosis Date   Arthritis    R shoulder, bone spur   Arthritis    "hands" (03/25/2016)   Chronic diastolic heart failure (HCC)    Chronic lower back pain    Coronary heart disease    Dr Pernell Dupre   Diverticulitis 12/2021   DVT (deep venous thrombosis) ([place]) 05/2022   left leg   H/O cardiovascular stress test    perhaps last one was 2009   Heart murmur    12/29/11 echo: mild MR, no AS, trivial TR   History of gout    Hyperlipidemia  Hypertension    saw Dr. Linard Millers last earl;y- 2014, cardiac cath. last done ?2009, blocks seen didn't require any intervention at that point.   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   EP IMPLANTABLE DEVICE  N/A 03/25/2016   Procedure: Pacemaker Implant - Dual Chamber;  Surgeon: GEvans Lance MD;  [facility];  Service: Cardiovascular;  Laterality: N/A;   FRACTURE SURGERY     HIP FRACTURE SURGERY     INSERT / REPLACE / REMOVE PACEMAKER  03/24/2016   INTRAMEDULLARY (IM) NAIL INTERTROCHANTERIC Left 02/08/2022   Procedure: INTRAMEDULLARY (IM) NAIL INTERTROCHANTRIC;  Surgeon: DMeredith Pel MD;  [place]  Service: Orthopedics;  Laterality: Left;   IR KYPHO LUMBAR INC FX REDUCE BONE BX UNI/BIL CANNULATION INC/IMAGING  12/29/2021   IR RADIOLOGIST EVAL & MGMT  12/15/2021   IR RADIOLOGY PERIPHERAL GUIDED IV START  07/14/2022   IR [place] ACCESS RIGHT  07/15/2022   LEFT AND RIGHT HEART CATHETERIZATION WITH CORONARY ANGIOGRAM N/A 06/05/2014   Procedure: LEFT AND RIGHT HEART CATHETERIZATION WITH CORONARY ANGIOGRAM;  Surgeon: HSinclair Grooms MD;  [facility];  Service: Cardiovascular;  Laterality: N/A;   NASAL FRACTURE SURGERY     SHOULDER ARTHROSCOPY WITH ROTATOR CUFF REPAIR AND SUBACROMIAL DECOMPRESSION Right 08/16/2013   Procedure: SHOULDER ARTHROSCOPY WITH ROTATOR CUFF REPAIR AND SUBACROMIAL DECOMPRESSION;  Surgeon: JNita Sells MD;  [place]  Service: Orthopedics;  Laterality: Right;  Right shoulder arthroscopy rotator cuff repair, subacromial decompression.   TONSILLECTOMY      I have reviewed the social history and family history with the patient and they are unchanged from previous note.  ALLERGIES:  is allergic to amantadines, clonazepam, and zoloft [sertraline].  MEDICATIONS:  Current Outpatient Medications  Medication Sig Dispense Refill   acetaminophen (TYLENOL) 500 MG tablet Take 500-1,000 mg by mouth every 6 (six) hours as needed for moderate pain or mild pain.     atorvastatin (LIPITOR) 40 MG tablet Take 0.5 tablets (20 mg total) by mouth daily. 30 tablet 0   bifidobacterium infantis (ALIGN) capsule Take 1 capsule by  mouth daily.     Camphor-Menthol-Methyl Sal (HM SALONPAS PAIN RELIEF EX) Apply 1 patch topically daily as needed (Back pain).     carbidopa-levodopa (SINEMET CR) 50-200 MG tablet Take 1 tablet by mouth at bedtime. 30 tablet 3   carbidopa-levodopa (SINEMET IR) 25-100 MG tablet Take 1.5 tablets by mouth 3 (three) times daily. 180 tablet 2   cholecalciferol (VITAMIN D3) 25 MCG (1000 UNIT) tablet Take 1,000 Units by mouth daily.     clotrimazole-betamethasone (LOTRISONE) cream Apply 1 Application topically daily as needed ([place]).     docusate sodium (COLACE) 100 MG capsule Take 1 capsule (100 mg total) by mouth 2 (two) times daily. (Patient taking differently: Take 100 mg by mouth daily as needed for mild constipation or moderate constipation. As Needed) 10 capsule 0   feeding supplement, GLUCERNA SHAKE, (GLUCERNA SHAKE) LIQD Take 237 mLs by mouth 2 (two) times daily between meals.  Alternate with ensure Diabetic     furosemide (LASIX) 20 MG tablet USE AS NEEDED FOR UP TO 5 DAYS IF SWELLING IN LEGS RECURS. CONTACT CARDIOLOGY IS SWELLING DOES NOT IMPROVE OR GETS WORSE. 15 tablet 0   lidocaine (LIDODERM) 5 % Place 1 patch onto the skin daily as needed. Purchase over the counter. On for 12 hours and off for 12 hours 30 patch 0   magnesium gluconate (MAGONATE) 500 MG tablet Take 0.5 tablets (250 mg total) by mouth at bedtime. 30 tablet 0   Melatonin 10 MG TABS Take 10 mg by mouth at bedtime.     metFORMIN (GLUCOPHAGE) 1000 MG tablet Take 1 tablet (1,000 mg total) by mouth daily with breakfast. (Patient taking differently: Take 500 mg by mouth 3 (three) times daily.) 30 tablet 0   metoprolol tartrate (LOPRESSOR) 25 MG tablet Take 1 tablet (25 mg total) by mouth 2 (two) times daily. (Patient taking differently: Take 12.5 mg by mouth 2 (two) times daily.) 60 tablet 0   modafinil (PROVIGIL) 200 MG tablet Take 1 tablet (200 mg total) by mouth daily. 30 tablet 5   Multiple Vitamin (MULTIVITAMIN WITH MINERALS) TABS  tablet Take 1 tablet by mouth daily.     NITROSTAT 0.4 MG SL tablet PLACE 1 TABLET (0.4 MG TOTAL) UNDER THE TONGUE EVERY 5 (FIVE) MINUTES AS NEEDED FOR CHEST PAIN. (Patient taking differently: Place 0.4 mg under the tongue every 5 (five) minutes as needed for chest pain.) 25 tablet 5   Omega-3 Fatty Acids (OMEGA 3 PO) Take 1,280 mg by mouth daily.     polyethylene glycol (MIRALAX / GLYCOLAX) 17 g packet Take 17 g by mouth daily. 14 each 0   polyvinyl alcohol (LIQUIFILM TEARS) 1.4 % ophthalmic solution Place 1 drop into both eyes as needed for dry eyes. (Patient taking differently: Place 1 drop into both eyes daily as needed for dry eyes.) 15 mL 0   QUEtiapine (SEROQUEL) 25 MG tablet Take 1 tablet (25 mg total) by mouth at bedtime. 30 tablet 0   Rivaroxaban (XARELTO) 15 MG TABS tablet Take 1 tablet (15 mg total) by mouth daily with supper. 90 tablet 1   Vitamin D, Ergocalciferol, (DRISDOL) 1.25 MG (50000 UNIT) CAPS capsule TAKE 1 CAPSULE (50,000 UNITS TOTAL) BY MOUTH EVERY 7 (SEVEN) DAYS 5 capsule 0   No current facility-administered medications for this visit.    PHYSICAL EXAMINATION: ECOG PERFORMANCE STATUS: 3 - Symptomatic, >50% confined to bed  Vitals:   12/17/22 1311  BP: (!) 89/49  Pulse: 66  Resp: 19  Temp: 98.6 F (37 C)  SpO2: 99%   Wt Readings from Last 3 Encounters:  12/17/22 217 lb 9 oz (98.7 kg)  10/14/22 222 lb (100.7 kg)  09/16/22 220 lb (99.8 kg)     GENERAL:alert, no distress and comfortable SKIN: skin color normal, no rashes or significant lesions EYES: normal, Conjunctiva are pink and non-injected, sclera clear  NEURO: alert & oriented x 3 with fluent speech   LABORATORY DATA:  I have reviewed the data as listed    Latest Ref Rng & Units 12/17/2022   12:52 PM 09/17/2022    1:49 PM 09/16/2022    4:03 PM  CBC  WBC 4.0 - 10.5 K/uL 10.7  10.4  10.2   Hemoglobin 13.0 - 17.0 g/dL 13.0  14.3  15.0   Hematocrit 39.0 - 52.0 % 39.0  42.6  46.0   Platelets 150 -  400 K/uL 281  257  241         Latest Ref Rng & Units 12/17/2022   12:52 PM 09/17/2022    1:49 PM 09/16/2022    4:03 PM  CMP  Glucose 70 - 99 mg/dL 324  308  222   BUN 8 - 23 mg/dL 24  27  20   $ Creatinine 0.61 - 1.24 mg/dL 0.96  1.08  0.99   Sodium 135 - 145 mmol/L 138  137  138   Potassium 3.5 - 5.1 mmol/L 4.3  4.3  4.3   Chloride 98 - 111 mmol/L 103  102  102   CO2 22 - 32 mmol/L 23  24  23   $ Calcium 8.9 - 10.3 mg/dL 9.2  9.9  9.7   Total Protein 6.5 - 8.1 g/dL 7.0  7.5    Total Bilirubin 0.3 - 1.2 mg/dL 0.5  0.5    Alkaline Phos 38 - 126 U/L 79  76    AST 15 - 41 U/L 14  20    ALT 0 - 44 U/L 8  8        RADIOGRAPHIC STUDIES: I have personally reviewed the radiological images as listed and agreed with the findings in the report. No results found.    No orders of the defined types were placed in this encounter.  All questions were answered. The patient knows to call the clinic with any problems, questions or concerns. No barriers to learning was detected. The total time spent in the appointment was 30 minutes.     Truitt Merle, MD 12/17/2022   Felicity Coyer, CMA, am acting as scribe for Truitt Merle, MD.   I have reviewed the above documentation for accuracy and completeness, and I agree with the above.

## 2022-12-17 ENCOUNTER — Encounter: Payer: Self-pay | Admitting: Hematology

## 2022-12-17 ENCOUNTER — Inpatient Hospital Stay: Payer: Medicare (Managed Care) | Attending: Physician Assistant

## 2022-12-17 ENCOUNTER — Other Ambulatory Visit: Payer: Self-pay

## 2022-12-17 ENCOUNTER — Inpatient Hospital Stay (HOSPITAL_BASED_OUTPATIENT_CLINIC_OR_DEPARTMENT_OTHER): Payer: Medicare (Managed Care) | Admitting: Hematology

## 2022-12-17 ENCOUNTER — Inpatient Hospital Stay: Payer: Medicare (Managed Care)

## 2022-12-17 VITALS — BP 89/49 | HR 66 | Temp 98.6°F | Resp 19 | Wt 217.6 lb

## 2022-12-17 DIAGNOSIS — E1165 Type 2 diabetes mellitus with hyperglycemia: Secondary | ICD-10-CM | POA: Diagnosis not present

## 2022-12-17 DIAGNOSIS — C7A8 Other malignant neuroendocrine tumors: Secondary | ICD-10-CM | POA: Diagnosis not present

## 2022-12-17 DIAGNOSIS — Z7901 Long term (current) use of anticoagulants: Secondary | ICD-10-CM | POA: Diagnosis not present

## 2022-12-17 DIAGNOSIS — Z8673 Personal history of transient ischemic attack (TIA), and cerebral infarction without residual deficits: Secondary | ICD-10-CM | POA: Insufficient documentation

## 2022-12-17 DIAGNOSIS — I824Y2 Acute embolism and thrombosis of unspecified deep veins of left proximal lower extremity: Secondary | ICD-10-CM

## 2022-12-17 DIAGNOSIS — C7B8 Other secondary neuroendocrine tumors: Secondary | ICD-10-CM | POA: Diagnosis not present

## 2022-12-17 DIAGNOSIS — Z86718 Personal history of other venous thrombosis and embolism: Secondary | ICD-10-CM | POA: Diagnosis not present

## 2022-12-17 DIAGNOSIS — I10 Essential (primary) hypertension: Secondary | ICD-10-CM | POA: Diagnosis not present

## 2022-12-17 DIAGNOSIS — Z79899 Other long term (current) drug therapy: Secondary | ICD-10-CM | POA: Insufficient documentation

## 2022-12-17 DIAGNOSIS — E1159 Type 2 diabetes mellitus with other circulatory complications: Secondary | ICD-10-CM | POA: Insufficient documentation

## 2022-12-17 LAB — CBC WITH DIFFERENTIAL/PLATELET
Abs Immature Granulocytes: 0.04 10*3/uL (ref 0.00–0.07)
Basophils Absolute: 0.1 10*3/uL (ref 0.0–0.1)
Basophils Relative: 1 %
Eosinophils Absolute: 0.4 10*3/uL (ref 0.0–0.5)
Eosinophils Relative: 3 %
HCT: 39 % (ref 39.0–52.0)
Hemoglobin: 13 g/dL (ref 13.0–17.0)
Immature Granulocytes: 0 %
Lymphocytes Relative: 23 %
Lymphs Abs: 2.4 10*3/uL (ref 0.7–4.0)
MCH: 30.1 pg (ref 26.0–34.0)
MCHC: 33.3 g/dL (ref 30.0–36.0)
MCV: 90.3 fL (ref 80.0–100.0)
Monocytes Absolute: 0.9 10*3/uL (ref 0.1–1.0)
Monocytes Relative: 8 %
Neutro Abs: 7 10*3/uL (ref 1.7–7.7)
Neutrophils Relative %: 65 %
Platelets: 281 10*3/uL (ref 150–400)
RBC: 4.32 MIL/uL (ref 4.22–5.81)
RDW: 14.3 % (ref 11.5–15.5)
WBC: 10.7 10*3/uL — ABNORMAL HIGH (ref 4.0–10.5)
nRBC: 0 % (ref 0.0–0.2)

## 2022-12-17 LAB — COMPREHENSIVE METABOLIC PANEL
ALT: 8 U/L (ref 0–44)
AST: 14 U/L — ABNORMAL LOW (ref 15–41)
Albumin: 3.9 g/dL (ref 3.5–5.0)
Alkaline Phosphatase: 79 U/L (ref 38–126)
Anion gap: 12 (ref 5–15)
BUN: 24 mg/dL — ABNORMAL HIGH (ref 8–23)
CO2: 23 mmol/L (ref 22–32)
Calcium: 9.2 mg/dL (ref 8.9–10.3)
Chloride: 103 mmol/L (ref 98–111)
Creatinine, Ser: 0.96 mg/dL (ref 0.61–1.24)
GFR, Estimated: >60 mL/min (ref >60)
Glucose, Bld: 324 mg/dL — ABNORMAL HIGH (ref 70–99)
Potassium: 4.3 mmol/L (ref 3.5–5.1)
Sodium: 138 mmol/L (ref 135–145)
Total Bilirubin: 0.5 mg/dL (ref 0.3–1.2)
Total Protein: 7 g/dL (ref 6.5–8.1)

## 2022-12-17 NOTE — Assessment & Plan Note (Signed)
-  Found in June 2023, related to his underlying malignancy and limited mobility -Recommend anticoagulation Xarelto lifelong, at 15 mg daily due to history of intermittent hematuria.

## 2022-12-17 NOTE — Assessment & Plan Note (Signed)
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 CT AP on 06/17/22 showed: calcified mesenteric mass with suspected associated small bowel mass; suspicious right hepatic lesion; nodular enhancement along wall of bowel loop in central abdomen.  -abdomen MRI 06/23/22 showed: 3.9 cm mass in right hepatic lobe; small RLQ mesenteric mass or lymphadenopathy. 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 -PET DOTATATE from 08/12/22 showed avid disease within the liver, mesentery and small bowel. 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 He tolerated well with no noticeable side effects.

## 2022-12-20 LAB — CHROMOGRANIN A: Chromogranin A (ng/mL): 41.7 ng/mL (ref 0.0–101.8)

## 2022-12-21 DIAGNOSIS — M9905 Segmental and somatic dysfunction of pelvic region: Secondary | ICD-10-CM | POA: Diagnosis not present

## 2022-12-21 DIAGNOSIS — M9904 Segmental and somatic dysfunction of sacral region: Secondary | ICD-10-CM | POA: Diagnosis not present

## 2022-12-21 DIAGNOSIS — M5136 Other intervertebral disc degeneration, lumbar region: Secondary | ICD-10-CM | POA: Diagnosis not present

## 2022-12-21 DIAGNOSIS — M9903 Segmental and somatic dysfunction of lumbar region: Secondary | ICD-10-CM | POA: Diagnosis not present

## 2022-12-22 DIAGNOSIS — G4733 Obstructive sleep apnea (adult) (pediatric): Secondary | ICD-10-CM | POA: Diagnosis not present

## 2023-01-11 ENCOUNTER — Other Ambulatory Visit: Payer: Self-pay | Admitting: Hematology

## 2023-01-13 ENCOUNTER — Other Ambulatory Visit: Payer: Self-pay | Admitting: Neurology

## 2023-01-13 ENCOUNTER — Other Ambulatory Visit: Payer: Self-pay | Admitting: Physical Medicine and Rehabilitation

## 2023-01-14 ENCOUNTER — Encounter
Payer: Medicare (Managed Care) | Attending: Physical Medicine and Rehabilitation | Admitting: Physical Medicine and Rehabilitation

## 2023-01-14 ENCOUNTER — Encounter: Payer: Self-pay | Admitting: Physical Medicine and Rehabilitation

## 2023-01-14 VITALS — BP 99/63 | HR 62 | Ht 68.0 in | Wt 213.4 lb

## 2023-01-14 DIAGNOSIS — G20A1 Parkinson's disease without dyskinesia, without mention of fluctuations: Secondary | ICD-10-CM | POA: Diagnosis not present

## 2023-01-14 DIAGNOSIS — Z789 Other specified health status: Secondary | ICD-10-CM | POA: Insufficient documentation

## 2023-01-14 DIAGNOSIS — Z7409 Other reduced mobility: Secondary | ICD-10-CM | POA: Diagnosis not present

## 2023-01-14 DIAGNOSIS — S72002D Fracture of unspecified part of neck of left femur, subsequent encounter for closed fracture with routine healing: Secondary | ICD-10-CM | POA: Insufficient documentation

## 2023-01-14 NOTE — Progress Notes (Signed)
Subjective:    Patient ID: Jordan [name], male    DOB: 03/11/1941, 82 y.o.   MRN: [id number]  HPI Jordan [name] is an 82 year old man who presents for follow-up after hip fracture.   1) Left hip fracture:  -not walking much, but can walk with rolling walker I condominium unit.  -his wife accompanies him today and provides most of history -his walking discharge is very short.  -no longer painful -has weakness in bilateral lower extremities -no more falls.   2) Parkinson's: -sometimes muscles don't cooperate the way they should. -he has vascular parkinsonism -has stooped posture  3) Right leg weakness: -when walking.  -wife is nervous about him falling -he was in skilled rehab and was discharged June 8th  -they have not yet set up outpatient therapy -he has not had any professional therapy since discharge on June 8th.   4) 6 bottom teeth removed -has more of a challenge in forming words.   5) Neuroendocrine tumor -being treated with monthly octreotide -no current symptoms currently -was having persistent abdominal pain- they thought it was a flare of diverticulitis but this was not present -CBGs increase because of octreotide- CBGs as high as 250/260 -he asks for icecream  Pain Inventory Average Pain  varies between 3-6 Pain Right Now 3 My pain is intermittent, dull, and aching  LOCATION OF PAIN  back and lower left hip  BOWEL Number of stools per week: 1-2 Oral laxative use Yes  Type of laxative Miralax, senna plus   BLADDER Pads Bladder incontinence Yes  Frequent urination No  Leakage with coughing No  Difficulty starting stream No  Incomplete bladder emptying No    Mobility walk with assistance use a walker how many minutes can you walk? 1-2 ability to climb steps?  no do you drive?  no use a wheelchair needs help with transfers Do you have any goals in this area?  yes  Function retired  Neuro/Psych bladder control  problems weakness tremor trouble walking dizziness loss of taste or smell  Prior Studies Any changes since last visit?  no  Physicians involved in your care Any changes since last visit?  no   Family History  Problem Relation Age of Onset   Sleep apnea Sister    Colon cancer Sister    Social History   Socioeconomic History   Marital status: Married    Spouse name: [name]   Number of children: Not on file   Years of education: Not on file   Highest education level: Not on file  Occupational History   Occupation: [employer]   Occupation: [place] [employer]  Tobacco Use   Smoking status: Former    Packs/day: 2.50    Years: 6.00    Additional pack years: 0.00    Total pack years: 15.00    Types: Cigarettes    Quit date: 06/05/1967    Years since quitting: 55.6   Smokeless tobacco: Never  Vaping Use   Vaping Use: Never used  Substance and Sexual Activity   Alcohol use: Yes    Comment: rare   Drug use: No   Sexual activity: Not Currently  Other Topics Concern   Not on file  Social History Narrative   Lives with wife and son   Right handed   Drinks 1 cups caffeine daily   Social Determinants of Health   Financial Resource Strain: Not on file  Food Insecurity: Not on file  Transportation Needs: Not on file  Physical Activity: Not on file  Stress: Not on file  Social Connections: Not on file   Past Surgical History:  Procedure Laterality Date   CARDIAC CATHETERIZATION  2009?   EP IMPLANTABLE DEVICE N/A 03/25/2016   Procedure: Pacemaker Implant - Dual Chamber;  Surgeon: Evans Lance, MD;  Location: [place] CV LAB;  Service: Cardiovascular;  Laterality: N/A;   FRACTURE SURGERY     HIP FRACTURE SURGERY     INSERT / REPLACE / REMOVE PACEMAKER  03/24/2016   INTRAMEDULLARY (IM) NAIL INTERTROCHANTERIC Left 02/08/2022   Procedure: INTRAMEDULLARY (IM) NAIL INTERTROCHANTRIC;  Surgeon: Meredith Pel, MD;  Location: [place];  Service: Orthopedics;   Laterality: Left;   IR KYPHO LUMBAR INC FX REDUCE BONE BX UNI/BIL CANNULATION INC/IMAGING  12/29/2021   IR RADIOLOGIST EVAL & MGMT  12/15/2021   IR RADIOLOGY PERIPHERAL GUIDED IV START  07/14/2022   IR [place] GUIDE VASC ACCESS RIGHT  07/15/2022   LEFT AND RIGHT HEART CATHETERIZATION WITH CORONARY ANGIOGRAM N/A 06/05/2014   Procedure: LEFT AND RIGHT HEART CATHETERIZATION WITH CORONARY ANGIOGRAM;  Surgeon: Sinclair Grooms, MD;  Location: [facility] CATH LAB;  Service: Cardiovascular;  Laterality: N/A;   NASAL FRACTURE SURGERY     SHOULDER ARTHROSCOPY WITH ROTATOR CUFF REPAIR AND SUBACROMIAL DECOMPRESSION Right 08/16/2013   Procedure: SHOULDER ARTHROSCOPY WITH ROTATOR CUFF REPAIR AND SUBACROMIAL DECOMPRESSION;  Surgeon: [name], MD;  Location: [place];  Service: Orthopedics;  Laterality: Right;  Right shoulder arthroscopy rotator cuff repair, subacromial decompression.   TONSILLECTOMY     Past Medical History:  Diagnosis Date   Arthritis    R shoulder, bone spur   Arthritis    "hands" (03/25/2016)   Chronic diastolic heart failure (HCC)    Chronic lower back pain    Coronary heart disease    Dr Pernell Dupre   Diverticulitis 12/2021   DVT (deep venous thrombosis) ([place]) 05/2022   left leg   H/O cardiovascular stress test    perhaps last one was 2009   Heart murmur    12/29/11 echo: mild MR, no AS, trivial TR   History of gout    Hyperlipidemia    Hypertension    saw Dr. Linard Millers last earl;y- 2014, cardiac cath. last done ?2009, blocks seen didn't require any intervention at that point.   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  Opioid Risk Score:   Fall Risk Score:  `1  Depression screen [facility]  2/9     10/14/2022   11:55 AM 04/19/2022   11:18 AM 05/09/2020   10:37 AM  Depression screen PHQ 2/9  Decreased Interest 0 0 0  Down, Depressed, Hopeless 0 1 0  PHQ - 2 Score 0 1 0  Altered sleeping  0   Tired, decreased energy  1   Change in appetite  0   Feeling bad or failure about yourself   0   Trouble concentrating  0   Moving slowly or fidgety/restless  1   Suicidal thoughts  0   PHQ-9 Score  3      Review of  Systems  Constitutional: Negative.   HENT: Negative.    Eyes: Negative.   Respiratory:  Positive for apnea and cough.   Cardiovascular: Negative.   Gastrointestinal:  Positive for constipation.  Endocrine: Negative.   Genitourinary:  Positive for urgency.  Musculoskeletal:  Positive for back pain and gait problem.  Skin: Negative.   Allergic/Immunologic: Negative.   Neurological:  Positive for dizziness, tremors and weakness.  Hematological:  Bruises/bleeds easily.  Psychiatric/Behavioral: Negative.        Objective:   Physical Exam Gen: no distress, normal appearing, 100/64, BMI 32.45, weigh 213 lbs.  HEENT: oral mucosa pink and moist, NCAT Cardio: Reg rate Chest: normal effort, normal rate of breathing Abd: soft, non-distended Ext: no edema Psych: pleasant, normal affect Skin: intact Neuro: Alert, flat affect, good sense of humor, wife has to provide most of history, =word finding difficulties Musculoskeletal: In WC, able to use bathroom with wife's assistance     Assessment & Plan:  1) CVA -discussed whether he would benefit from refresher of PT and OT and SLP -would benefit from handicap placard to increase mobility in the community -reviewed all medications and provided necessary refills. -discussed vagal nerve stimulation if strength fails to improve in 6 months.  -discussed maximizing protein -discussed word finding difficulties -discussed hyperbaric oxygen therapy  2) Fatigue -continue vitamin D supplement -replace amantadine with  modafinil. Continue modafinil.  Provided dietary education.   3) R leg weakess: -discussed relation with quadriceps weakness.   4) Neuroendocrine tumor -discussed octreotide.  -discussed decreased appetite  5) Parkinson's Disease -continue Sinemet -discussed current symptoms -prescribed PT  6) Impaired mobility and ADLs -discussed OT  7) Hypophonic speech:  -referred SLP  40 minutes spent in discussion of his impaired mobility and ADLs, hypophonic speech, Parkinson's Disease symptoms, lack of pain from hip fracture, referred to SLP/OT/PT, discussed fatigue, how he is using modafinil

## 2023-01-18 DIAGNOSIS — M9903 Segmental and somatic dysfunction of lumbar region: Secondary | ICD-10-CM | POA: Diagnosis not present

## 2023-01-18 DIAGNOSIS — M9904 Segmental and somatic dysfunction of sacral region: Secondary | ICD-10-CM | POA: Diagnosis not present

## 2023-01-18 DIAGNOSIS — M5136 Other intervertebral disc degeneration, lumbar region: Secondary | ICD-10-CM | POA: Diagnosis not present

## 2023-01-18 DIAGNOSIS — M9905 Segmental and somatic dysfunction of pelvic region: Secondary | ICD-10-CM | POA: Diagnosis not present

## 2023-01-19 NOTE — Telephone Encounter (Signed)
This pat has duplicate sinemet, one is for cr and one is ir. I wanted to make sure that they are supposed to be taking both prior to refilling

## 2023-01-20 DIAGNOSIS — G4733 Obstructive sleep apnea (adult) (pediatric): Secondary | ICD-10-CM | POA: Diagnosis not present

## 2023-01-21 ENCOUNTER — Encounter: Payer: Self-pay | Admitting: Internal Medicine

## 2023-01-21 DIAGNOSIS — G4733 Obstructive sleep apnea (adult) (pediatric): Secondary | ICD-10-CM | POA: Diagnosis not present

## 2023-01-27 ENCOUNTER — Ambulatory Visit (INDEPENDENT_AMBULATORY_CARE_PROVIDER_SITE_OTHER): Payer: Medicare (Managed Care)

## 2023-01-27 DIAGNOSIS — I495 Sick sinus syndrome: Secondary | ICD-10-CM | POA: Diagnosis not present

## 2023-01-27 LAB — CUP PACEART REMOTE DEVICE CHECK
Battery Voltage: 50
Date Time Interrogation Session: 20240328072811
Implantable Lead Connection Status: 753985
Implantable Lead Connection Status: 753985
Implantable Lead Implant Date: 20170525
Implantable Lead Implant Date: 20170525
Implantable Lead Location: 753859
Implantable Lead Location: 753860
Implantable Lead Model: 377
Implantable Lead Model: 377
Implantable Lead Serial Number: 49470224
Implantable Lead Serial Number: 49485613
Implantable Pulse Generator Implant Date: 20170525
Pulse Gen Model: 394969
Pulse Gen Serial Number: 68798589

## 2023-01-31 ENCOUNTER — Ambulatory Visit: Payer: Medicare (Managed Care) | Admitting: Internal Medicine

## 2023-02-01 DIAGNOSIS — M9903 Segmental and somatic dysfunction of lumbar region: Secondary | ICD-10-CM | POA: Diagnosis not present

## 2023-02-01 DIAGNOSIS — M9905 Segmental and somatic dysfunction of pelvic region: Secondary | ICD-10-CM | POA: Diagnosis not present

## 2023-02-01 DIAGNOSIS — M9904 Segmental and somatic dysfunction of sacral region: Secondary | ICD-10-CM | POA: Diagnosis not present

## 2023-02-01 DIAGNOSIS — M5136 Other intervertebral disc degeneration, lumbar region: Secondary | ICD-10-CM | POA: Diagnosis not present

## 2023-02-03 ENCOUNTER — Telehealth: Payer: Self-pay | Admitting: Hematology

## 2023-02-03 NOTE — Telephone Encounter (Signed)
Contacted patient to scheduled appointments. Left message with appointment details and a call back number if patient had any questions or could not accommodate the time we provided.   

## 2023-02-08 DIAGNOSIS — M9903 Segmental and somatic dysfunction of lumbar region: Secondary | ICD-10-CM | POA: Diagnosis not present

## 2023-02-08 DIAGNOSIS — M5136 Other intervertebral disc degeneration, lumbar region: Secondary | ICD-10-CM | POA: Diagnosis not present

## 2023-02-08 DIAGNOSIS — M9904 Segmental and somatic dysfunction of sacral region: Secondary | ICD-10-CM | POA: Diagnosis not present

## 2023-02-08 DIAGNOSIS — M9905 Segmental and somatic dysfunction of pelvic region: Secondary | ICD-10-CM | POA: Diagnosis not present

## 2023-02-08 NOTE — Therapy (Unsigned)
OUTPATIENT SPEECH LANGUAGE PATHOLOGY PARKINSON'S EVALUATION   Patient Name: Jordan [name] MRN: [id number] DOB:1940-11-25, 82 y.o., male Today's Date: 02/08/2023  PCP: Johny Blamer, MD REFERRING PROVIDER:   Horton Chin, MD    END OF SESSION:   Past Medical History:  Diagnosis Date   Arthritis    R shoulder, bone spur   Arthritis    "hands" (03/25/2016)   Chronic diastolic heart failure (HCC)    Chronic lower back pain    Coronary heart disease    Dr Garnette Scheuermann   Diverticulitis 12/2021   DVT (deep venous thrombosis) (HCC) 05/2022   left leg   H/O cardiovascular stress test    perhaps last one was 2009   Heart murmur    12/29/11 echo: mild MR, no AS, trivial TR   History of gout    Hyperlipidemia    Hypertension    saw Dr. Mendel Ryder last earl;y- 2014, cardiac cath. last done ?2009, blocks seen didn't require any intervention at that point.   Narcolepsy    [name] 04-11-97; Mean Latency 1.82min, SOREM 2   OSA on CPAP    NPSG 12-13-98 AHI 22.7   PONV (postoperative nausea and vomiting)    Presence of permanent cardiac pacemaker    Shortness of breath    with exertion    Stroke (HCC)    Type II diabetes mellitus (HCC)    Past Surgical History:  Procedure Laterality Date   CARDIAC CATHETERIZATION  2009?   EP IMPLANTABLE DEVICE N/A 03/25/2016   Procedure: Pacemaker Implant - Dual Chamber;  Surgeon: Marinus Maw, MD;  Location: [facility] INVASIVE CV LAB;  Service: Cardiovascular;  Laterality: N/A;   FRACTURE SURGERY     HIP FRACTURE SURGERY     INSERT / REPLACE / REMOVE PACEMAKER  03/24/2016   INTRAMEDULLARY (IM) NAIL INTERTROCHANTERIC Left 02/08/2022   Procedure: INTRAMEDULLARY (IM) NAIL INTERTROCHANTRIC;  Surgeon: Cammy Copa, MD;  Location: [facility] OR;  Service: Orthopedics;  Laterality: Left;   IR KYPHO LUMBAR INC FX REDUCE BONE BX UNI/BIL CANNULATION INC/IMAGING  12/29/2021   IR RADIOLOGIST EVAL & MGMT  12/15/2021   IR RADIOLOGY PERIPHERAL GUIDED IV START   07/14/2022   IR US GUIDE VASC ACCESS RIGHT  07/15/2022   LEFT AND RIGHT HEART CATHETERIZATION WITH CORONARY ANGIOGRAM N/A 06/05/2014   Procedure: LEFT AND RIGHT HEART CATHETERIZATION WITH CORONARY ANGIOGRAM;  Surgeon: Lesleigh Noe, MD;  Location: [facility] CATH LAB;  Service: Cardiovascular;  Laterality: N/A;   NASAL FRACTURE SURGERY     SHOULDER ARTHROSCOPY WITH ROTATOR CUFF REPAIR AND SUBACROMIAL DECOMPRESSION Right 08/16/2013   Procedure: SHOULDER ARTHROSCOPY WITH ROTATOR CUFF REPAIR AND SUBACROMIAL DECOMPRESSION;  Surgeon: Mable Paris, MD;  Location: [facility] OR;  Service: Orthopedics;  Laterality: Right;  Right shoulder arthroscopy rotator cuff repair, subacromial decompression.   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   Acute deep vein thrombosis (DVT) of left lower extremity 04/29/2022   Closed intertrochanteric fracture of left hip, initial encounter    Closed left hip fracture (HCC) secondary to fall at home 02/08/2022   Paroxysmal atrial fibrillation 02/08/2022   Debility 01/08/2022   Constipation 01/07/2022   Physical deconditioning 01/07/2022   Acute metabolic encephalopathy 01/04/2022   Hypomagnesemia 01/04/2022   Pancreatitis 12/30/2021   Parkinson's disease 12/21/2021   Parkinson disease 12/18/2021   Frequent falls 12/18/2021   History of CVA (cerebrovascular  accident) 12/18/2021   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   Hallucinations, visual 07/30/2021   Acetabulum fracture, left 06/25/2021   Acute hip pain, left 06/24/2021   Fall at home, initial encounter 06/24/2021   Leukocytosis 06/24/2021   Chronic diastolic heart failure    Sinusitis, acute maxillary 06/24/2020   AKI (acute kidney injury)    Labile blood glucose    Cough    Benign essential HTN    Skin lesion of hand    Hypoalbuminemia due to protein-calorie malnutrition    Controlled type 2 diabetes mellitus  with hyperglycemia, without long-term current use of insulin    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   Morbid obesity    Acute blood loss anemia    Acute thalamic infarction 04/15/2020   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   TIA (transient ischemic attack) 02/17/2018   Type 2 diabetes mellitus with hyperlipidemia 02/17/2018   Insomnia 02/01/2018   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   Chronotropic incompetence 07/09/2014   Dyspnea on exertion 03/03/2014   HLD (hyperlipidemia) 12/13/2007   Essential hypertension 12/13/2007   Class 1 obesity 12/05/2007   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  Rationale for Evaluation and Treatment: Rehabilitation  SUBJECTIVE:   SUBJECTIVE STATEMENT: *** Pt accompanied by: {accompnied:27141}  PERTINENT HISTORY: ***  PAIN:  Are you having pain? {OPRCPAIN:27236}  FALLS: Has patient fallen in last 6 months?  {ZOXWRUEA:54098}{SLPFALLS:25679}  LIVING ENVIRONMENT: Lives with: {OPRC lives with:25569::"lives with their family"} Lives in: {Lives in:25570}  PLOF:  Level of assistance: {JXBJYNW:29562}{SLPPLOF:25673} Employment: {SLPemployment:25674}  PATIENT GOALS: ***  OBJECTIVE:   DIAGNOSTIC FINDINGS: ***  COGNITION: Overall cognitive status: {cognition:24006} Areas of impairment: {cognitive impairment:24009} Comments: ***  MOTOR SPEECH: Overall motor speech: {slpimpaired:27210} Level of impairment: {SLP level of impairment:25441} Respiration: {respbreathing:27195} Phonation: {SLP phonation:25439} Resonance: {SLP resonance:25440} Articulation: {SLParticulation:27218} Intelligibility: {SLP Intelligible:25442} Motor planning: {slpmotorspeecherrors:27220} Motor speech errors: {SLP motor  speech errors:25443} Interfering components: {SLP Interfering components (MS):25444} Effective technique: {SLP effective technique (MS):25445}  ORAL MOTOR EXAMINATION: Overall status: {OMESLP2:27645} Comments: ***   OBJECTIVE VOICE ASSESSMENT: Sustained "ah" maximum phonation time: *** seconds Sustained "ah" loudness average: *** dB Oral reading (passage) loudness average: *** dB Oral reading loudness range: *** dB Conversational loudness average: *** dB Conversational loudness range: *** dB Voice quality: {VQL:27192} Stimulability trials: Given SLP modeling and {frequency:26928} {level:26929} cues, loudness average increased to ***dB (range of *** to ***) at (loud "ah", word, sentence, paragraph, conversation) level.  Comments: ***  Completed audio recording of patients baseline voice without cueing from SLP: {yes/no:20286}  Pt {does does not:27788} report difficulty with swallowing which {does does not:27788} warrant further evaluation.  PATIENT REPORTED OUTCOME MEASURES (PROM): {SLPPROM:27095}  TODAY'S TREATMENT:  DATE: ***  PATIENT EDUCATION: Education details: *** Person educated: {Person educated:25204} Education method: {Education Method:25205} Education comprehension: {Education Comprehension:25206}  HOME EXERCISE PROGRAM: ***   GOALS: Goals reviewed with patient? {yes/no:20286}  SHORT TERM GOALS: Target date: ***  *** Baseline: Goal status: {GOALSTATUS:25110}  2.  *** Baseline:  Goal status: {GOALSTATUS:25110}  3.  *** Baseline:  Goal status: {GOALSTATUS:25110}  4.  *** Baseline:  Goal status: {GOALSTATUS:25110}  5.  *** Baseline:  Goal status: {GOALSTATUS:25110}  6.  *** Baseline:  Goal status: {GOALSTATUS:25110}  LONG TERM GOALS: Target date: ***  *** Baseline:  Goal status: {GOALSTATUS:25110}  2.   *** Baseline:  Goal status: {GOALSTATUS:25110}  3.  *** Baseline:  Goal status: {GOALSTATUS:25110}  4.  *** Baseline:  Goal status: {GOALSTATUS:25110}  5.  *** Baseline:  Goal status: {GOALSTATUS:25110}  6.  *** Baseline:  Goal status: {GOALSTATUS:25110}  ASSESSMENT:  CLINICAL IMPRESSION: Patient is a *** y.o. *** who was seen today for ***.   OBJECTIVE IMPAIRMENTS: Objective impairments include {SLPOBJIMP:27107}. These impairments are limiting patient from {SLPLIMIT:27108}.Factors affecting potential to achieve goals and functional outcome are {SLP factors:25450}.. Patient will benefit from skilled SLP services to address above impairments and improve overall function.  REHAB POTENTIAL: {rehabpotential:25112}  PLAN:  SLP FREQUENCY: {rehab frequency:25116}  SLP DURATION: {rehab duration:25117}  PLANNED INTERVENTIONS: {SLP treatment/interventions:25449}    Cephus Shelling, Student-SLP 02/08/2023, 3:05 PM

## 2023-02-08 NOTE — Therapy (Signed)
OUTPATIENT PHYSICAL THERAPY NEURO EVALUATION   Patient Name: Jordan [name] MRN: [id number] DOB:[birth year], 82 y.o., male Today's Date: 02/08/2023   PCP: Johny Blamer, MD  REFERRING PROVIDER:  Horton Chin, MD  END OF SESSION:   Past Medical History:  Diagnosis Date   Arthritis    R shoulder, bone spur   Arthritis    "hands" (03/25/2016)   Chronic diastolic heart failure (HCC)    Chronic lower back pain    Coronary heart disease    Dr Garnette Scheuermann   Diverticulitis 12/2021   DVT (deep venous thrombosis) (HCC) 05/2022   left leg   H/O cardiovascular stress test    perhaps last one was 2009   Heart murmur    12/29/11 echo: mild MR, no AS, trivial TR   History of gout    Hyperlipidemia    Hypertension    saw Dr. Mendel Ryder last earl;y- 2014, cardiac cath. last done ?2009, blocks seen didn't require any intervention at that point.   Narcolepsy    [name] 04-11-97; Mean Latency 1.75min, SOREM 2   OSA on CPAP    NPSG 12-13-98 AHI 22.7   PONV (postoperative nausea and vomiting)    Presence of permanent cardiac pacemaker    Shortness of breath    with exertion    Stroke (HCC)    Type II diabetes mellitus (HCC)    Past Surgical History:  Procedure Laterality Date   CARDIAC CATHETERIZATION  2009?   EP IMPLANTABLE DEVICE N/A 03/25/2016   Procedure: Pacemaker Implant - Dual Chamber;  Surgeon: Marinus Maw, MD;  Location: [facility] INVASIVE CV LAB;  Service: Cardiovascular;  Laterality: N/A;   FRACTURE SURGERY     HIP FRACTURE SURGERY     INSERT / REPLACE / REMOVE PACEMAKER  03/24/2016   INTRAMEDULLARY (IM) NAIL INTERTROCHANTERIC Left 02/08/2022   Procedure: INTRAMEDULLARY (IM) NAIL INTERTROCHANTRIC;  Surgeon: Cammy Copa, MD;  Location: [facility] OR;  Service: Orthopedics;  Laterality: Left;   IR KYPHO LUMBAR INC FX REDUCE BONE BX UNI/BIL CANNULATION INC/IMAGING  12/29/2021   IR RADIOLOGIST EVAL & MGMT  12/15/2021   IR RADIOLOGY PERIPHERAL GUIDED IV START  07/14/2022   IR US  GUIDE VASC ACCESS RIGHT  07/15/2022   LEFT AND RIGHT HEART CATHETERIZATION WITH CORONARY ANGIOGRAM N/A 06/05/2014   Procedure: LEFT AND RIGHT HEART CATHETERIZATION WITH CORONARY ANGIOGRAM;  Surgeon: Lesleigh Noe, MD;  Location: [facility] CATH LAB;  Service: Cardiovascular;  Laterality: N/A;   NASAL FRACTURE SURGERY     SHOULDER ARTHROSCOPY WITH ROTATOR CUFF REPAIR AND SUBACROMIAL DECOMPRESSION Right 08/16/2013   Procedure: SHOULDER ARTHROSCOPY WITH ROTATOR CUFF REPAIR AND SUBACROMIAL DECOMPRESSION;  Surgeon: Mable Paris, MD;  Location: [facility] OR;  Service: Orthopedics;  Laterality: Right;  Right shoulder arthroscopy rotator cuff repair, subacromial decompression.   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   Acute deep vein thrombosis (DVT) of left lower extremity 04/29/2022   Closed intertrochanteric fracture of left hip, initial encounter    Closed left hip fracture (HCC) secondary to fall at home 02/08/2022   Paroxysmal atrial fibrillation 02/08/2022   Debility 01/08/2022   Constipation 01/07/2022   Physical deconditioning 01/07/2022   Acute metabolic encephalopathy 01/04/2022   Hypomagnesemia 01/04/2022   Pancreatitis 12/30/2021   Parkinson's disease 12/21/2021   Parkinson disease 12/18/2021   Frequent falls 12/18/2021   History of CVA (cerebrovascular accident) 12/18/2021  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   Hallucinations, visual 07/30/2021   Acetabulum fracture, left 06/25/2021   Acute hip pain, left 06/24/2021   Fall at home, initial encounter 06/24/2021   Leukocytosis 06/24/2021   Chronic diastolic heart failure    Sinusitis, acute maxillary 06/24/2020   AKI (acute kidney injury)    Labile blood glucose    Cough    Benign essential HTN    Skin lesion of hand    Hypoalbuminemia due to protein-calorie malnutrition    Controlled type 2 diabetes mellitus with hyperglycemia,  without long-term current use of insulin    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   Morbid obesity    Acute blood loss anemia    Acute thalamic infarction 04/15/2020   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   TIA (transient ischemic attack) 02/17/2018   Type 2 diabetes mellitus with hyperlipidemia 02/17/2018   Insomnia 02/01/2018   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   Chronotropic incompetence 07/09/2014   Dyspnea on exertion 03/03/2014   HLD (hyperlipidemia) 12/13/2007   Essential hypertension 12/13/2007   Class 1 obesity 12/05/2007   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  Rationale for Evaluation and Treatment: Rehabilitation  SUBJECTIVE:                                                                                                                                                                                             SUBJECTIVE STATEMENT: *** Pt accompanied by: {accompnied:27141}  PERTINENT HISTORY: PMHx: PD (diagnosed Jan 2022), HLD, OSA, DM, HTN, chronic diastolic HF, CVA, frequent falls, L2 compression fx, L hip fx, neuroendocrine tumor - being treated with monthly octreotide   PAIN:  Are you having pain? {OPRCPAIN:27236}  PRECAUTIONS: {Therapy precautions:24002}  WEIGHT BEARING RESTRICTIONS: {Yes ***/No:24003}  FALLS: Has patient fallen in last 6 months? {fallsyesno:27318}  LIVING ENVIRONMENT: Lives with: {OPRC lives with:25569::"lives with their family"} Lives in: {Lives in:[address]70} Stairs: {opstairs:27293} Has following equipment at home: {Assistive devices:23999}  PLOF: {PLOF:24004}  PATIENT GOALS: ***  OBJECTIVE:   DIAGNOSTIC FINDINGS: ***  COGNITION: Overall cognitive  status: {cognition:24006}   SENSATION: {sensation:27233}  COORDINATION: ***  EDEMA:  {edema:24020}  MUSCLE TONE: {LE tone:25568}  MUSCLE LENGTH: Hamstrings: Right *** deg; Left *** deg Thomas test: Right *** deg; Left *** deg  DTRs:  {DTR SITE:24025}  POSTURE: {posture:25561}  LOWER EXTREMITY ROM:     {AROM/PROM:27142}  Right Eval Left Eval  Hip flexion  Hip extension    Hip abduction    Hip adduction    Hip internal rotation    Hip external rotation    Knee flexion    Knee extension    Ankle dorsiflexion    Ankle plantarflexion    Ankle inversion    Ankle eversion     (Blank rows = not tested)  LOWER EXTREMITY MMT:    MMT Right Eval Left Eval  Hip flexion    Hip extension    Hip abduction    Hip adduction    Hip internal rotation    Hip external rotation    Knee flexion    Knee extension    Ankle dorsiflexion    Ankle plantarflexion    Ankle inversion    Ankle eversion    (Blank rows = not tested)  BED MOBILITY:  {Bed mobility:24027}  TRANSFERS: Assistive device utilized: {Assistive devices:23999}  Sit to stand: {Levels of assistance:24026} Stand to sit: {Levels of assistance:24026} Chair to chair: {Levels of assistance:24026} Floor: {Levels of assistance:24026}  RAMP:  Level of Assistance: {Levels of assistance:24026} Assistive device utilized: {Assistive devices:23999} Ramp Comments: ***  CURB:  Level of Assistance: {Levels of assistance:24026} Assistive device utilized: {Assistive devices:23999} Curb Comments: ***  STAIRS: Level of Assistance: {Levels of assistance:24026} Stair Negotiation Technique: {Stair Technique:27161} with {Rail Assistance:27162} Number of Stairs: ***  Height of Stairs: ***  Comments: ***  GAIT: Gait pattern: {gait characteristics:25376} Distance walked: *** Assistive device utilized: {Assistive devices:23999} Level of assistance: {Levels of assistance:24026} Comments: ***  FUNCTIONAL TESTS:   {Functional tests:24029}  PATIENT SURVEYS:  {rehab surveys:24030}  TODAY'S TREATMENT:                                                                                                                              DATE: ***    PATIENT EDUCATION: Education details: *** Person educated: {Person educated:25204} Education method: {Education Method:25205} Education comprehension: {Education Comprehension:25206}  HOME EXERCISE PROGRAM: ***  GOALS: Goals reviewed with patient? {yes/no:20286}  SHORT TERM GOALS: Target date: ***  *** Baseline: Goal status: {GOALSTATUS:25110}  2.  *** Baseline:  Goal status: {GOALSTATUS:25110}  3.  *** Baseline:  Goal status: {GOALSTATUS:25110}  4.  *** Baseline:  Goal status: {GOALSTATUS:25110}  5.  *** Baseline:  Goal status: {GOALSTATUS:25110}  6.  *** Baseline:  Goal status: {GOALSTATUS:25110}  LONG TERM GOALS: Target date: ***  *** Baseline:  Goal status: {GOALSTATUS:25110}  2.  *** Baseline:  Goal status: {GOALSTATUS:25110}  3.  *** Baseline:  Goal status: {GOALSTATUS:25110}  4.  *** Baseline:  Goal status: {GOALSTATUS:25110}  5.  *** Baseline:  Goal status: {GOALSTATUS:25110}  6.  *** Baseline:  Goal status: {GOALSTATUS:25110}  ASSESSMENT:  CLINICAL IMPRESSION: Patient is a *** y.o. *** who was seen today for physical therapy evaluation and treatment for ***.   OBJECTIVE IMPAIRMENTS: {opptimpairments:25111}.   ACTIVITY LIMITATIONS: {activitylimitations:27494}  PARTICIPATION LIMITATIONS: {participationrestrictions:25113}  PERSONAL FACTORS: {Personal factors:25162} are also affecting  patient's functional outcome.   REHAB POTENTIAL: {rehabpotential:25112}  CLINICAL DECISION MAKING: {clinical decision making:25114}  EVALUATION COMPLEXITY: {Evaluation complexity:25115}  PLAN:  PT FREQUENCY: {rehab frequency:25116}  PT DURATION: {rehab duration:25117}  PLANNED INTERVENTIONS: {rehab planned  interventions:25118::"Therapeutic exercises","Therapeutic activity","Neuromuscular re-education","Balance training","Gait training","Patient/Family education","Self Care","Joint mobilization"}  PLAN FOR NEXT SESSION: ***   Drake Leach, PT, DPT 02/08/2023, 3:57 PM

## 2023-02-09 ENCOUNTER — Encounter: Payer: Self-pay | Admitting: Occupational Therapy

## 2023-02-09 ENCOUNTER — Ambulatory Visit: Payer: Medicare (Managed Care) | Admitting: Speech Pathology

## 2023-02-09 ENCOUNTER — Ambulatory Visit: Payer: Medicare (Managed Care) | Admitting: Occupational Therapy

## 2023-02-09 ENCOUNTER — Encounter: Payer: Self-pay | Admitting: Speech Pathology

## 2023-02-09 ENCOUNTER — Ambulatory Visit: Payer: Medicare (Managed Care) | Attending: Physical Medicine and Rehabilitation | Admitting: Physical Therapy

## 2023-02-09 ENCOUNTER — Other Ambulatory Visit: Payer: Self-pay

## 2023-02-09 DIAGNOSIS — R293 Abnormal posture: Secondary | ICD-10-CM | POA: Insufficient documentation

## 2023-02-09 DIAGNOSIS — M6281 Muscle weakness (generalized): Secondary | ICD-10-CM | POA: Diagnosis present

## 2023-02-09 DIAGNOSIS — R2681 Unsteadiness on feet: Secondary | ICD-10-CM | POA: Diagnosis present

## 2023-02-09 DIAGNOSIS — R41840 Attention and concentration deficit: Secondary | ICD-10-CM

## 2023-02-09 DIAGNOSIS — R41841 Cognitive communication deficit: Secondary | ICD-10-CM | POA: Diagnosis present

## 2023-02-09 DIAGNOSIS — G20A1 Parkinson's disease without dyskinesia, without mention of fluctuations: Secondary | ICD-10-CM | POA: Diagnosis not present

## 2023-02-09 DIAGNOSIS — R1312 Dysphagia, oropharyngeal phase: Secondary | ICD-10-CM | POA: Diagnosis present

## 2023-02-09 DIAGNOSIS — R471 Dysarthria and anarthria: Secondary | ICD-10-CM | POA: Insufficient documentation

## 2023-02-09 DIAGNOSIS — R2689 Other abnormalities of gait and mobility: Secondary | ICD-10-CM | POA: Diagnosis present

## 2023-02-09 DIAGNOSIS — R251 Tremor, unspecified: Secondary | ICD-10-CM | POA: Insufficient documentation

## 2023-02-09 DIAGNOSIS — R278 Other lack of coordination: Secondary | ICD-10-CM | POA: Diagnosis present

## 2023-02-09 NOTE — Therapy (Signed)
OUTPATIENT OCCUPATIONAL THERAPY PARKINSON'S EVALUATION  Patient Name: Jordan [name] MRN: [id number] DOB:1941/03/30, 82 y.o., male Today's Date: 02/09/2023  PCP: Johny Blamer, MD  REFERRING PROVIDER: Horton Chin, MD  END OF SESSION:  OT End of Session - 02/09/23 1736     Visit Number 1    Number of Visits 21    Date for OT Re-Evaluation 04/22/23    Authorization Type [employer] - [place], no auth required    Progress Note Due on Visit 10    OT Start Time 1149    OT Stop Time 1230    OT Time Calculation (min) 41 min    Activity Tolerance Patient tolerated treatment well    Behavior During Therapy WFL for tasks assessed/performed             Past Medical History:  Diagnosis Date   Arthritis    R shoulder, bone spur   Arthritis    "hands" (03/25/2016)   Chronic diastolic heart failure    Chronic lower back pain    Coronary heart disease    Dr Garnette Scheuermann   Diverticulitis 12/2021   DVT (deep venous thrombosis) 05/2022   left leg   H/O cardiovascular stress test    perhaps last one was 2009   Heart murmur    12/29/11 echo: mild MR, no AS, trivial TR   History of gout    Hyperlipidemia    Hypertension    saw Dr. Mendel Ryder last earl;y- 2014, cardiac cath. last done ?2009, blocks seen didn't require any intervention at that point.   Narcolepsy    [name] 04-11-97; Mean Latency 1.71min, SOREM 2   OSA on CPAP    NPSG 12-13-98 AHI 22.7   PONV (postoperative nausea and vomiting)    Presence of permanent cardiac pacemaker    Shortness of breath    with exertion    Stroke    Type II diabetes mellitus    Past Surgical History:  Procedure Laterality Date   CARDIAC CATHETERIZATION  2009?   EP IMPLANTABLE DEVICE N/A 03/25/2016   Procedure: Pacemaker Implant - Dual Chamber;  Surgeon: Marinus Maw, MD;  Location: [facility] INVASIVE CV LAB;  Service: Cardiovascular;  Laterality: N/A;   FRACTURE SURGERY     HIP FRACTURE SURGERY     INSERT / REPLACE / REMOVE PACEMAKER   03/24/2016   INTRAMEDULLARY (IM) NAIL INTERTROCHANTERIC Left 02/08/2022   Procedure: INTRAMEDULLARY (IM) NAIL INTERTROCHANTRIC;  Surgeon: Cammy Copa, MD;  Location: [facility] OR;  Service: Orthopedics;  Laterality: Left;   IR KYPHO LUMBAR INC FX REDUCE BONE BX UNI/BIL CANNULATION INC/IMAGING  12/29/2021   IR RADIOLOGIST EVAL & MGMT  12/15/2021   IR RADIOLOGY PERIPHERAL GUIDED IV START  07/14/2022   IR US GUIDE VASC ACCESS RIGHT  07/15/2022   LEFT AND RIGHT HEART CATHETERIZATION WITH CORONARY ANGIOGRAM N/A 06/05/2014   Procedure: LEFT AND RIGHT HEART CATHETERIZATION WITH CORONARY ANGIOGRAM;  Surgeon: Lesleigh Noe, MD;  Location: [facility] CATH LAB;  Service: Cardiovascular;  Laterality: N/A;   NASAL FRACTURE SURGERY     SHOULDER ARTHROSCOPY WITH ROTATOR CUFF REPAIR AND SUBACROMIAL DECOMPRESSION Right 08/16/2013   Procedure: SHOULDER ARTHROSCOPY WITH ROTATOR CUFF REPAIR AND SUBACROMIAL DECOMPRESSION;  Surgeon: Mable Paris, MD;  Location: [facility] OR;  Service: Orthopedics;  Laterality: Right;  Right shoulder arthroscopy rotator cuff repair, subacromial decompression.   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 07/23/2022   Acute deep vein thrombosis (DVT) of left lower extremity 04/29/2022   Closed intertrochanteric fracture of left hip, initial encounter    Closed left hip fracture (HCC) secondary to fall at home 02/08/2022   Paroxysmal atrial fibrillation 02/08/2022   Debility 01/08/2022   Constipation 01/07/2022   Physical deconditioning 01/07/2022   Acute metabolic encephalopathy 01/04/2022   Hypomagnesemia 01/04/2022   Pancreatitis 12/30/2021   Parkinson's disease 12/21/2021   Parkinson disease 12/18/2021   Frequent falls 12/18/2021   History of CVA (cerebrovascular accident) 12/18/2021   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   Hallucinations, visual 07/30/2021   Acetabulum fracture, left 06/25/2021    Acute hip pain, left 06/24/2021   Fall at home, initial encounter 06/24/2021   Leukocytosis 06/24/2021   Chronic diastolic heart failure    Sinusitis, acute maxillary 06/24/2020   AKI (acute kidney injury)    Labile blood glucose    Cough    Benign essential HTN    Skin lesion of hand    Hypoalbuminemia due to protein-calorie malnutrition    Controlled type 2 diabetes mellitus with hyperglycemia, without long-term current use of insulin    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   Morbid obesity    Acute blood loss anemia    Acute thalamic infarction 04/15/2020   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   TIA (transient ischemic attack) 02/17/2018   Type 2 diabetes mellitus with hyperlipidemia 02/17/2018   Insomnia 02/01/2018   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   Chronotropic incompetence 07/09/2014   Dyspnea on exertion 03/03/2014   HLD (hyperlipidemia) 12/13/2007   Essential hypertension 12/13/2007   Class 1 obesity 12/05/2007   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   Coronary atherosclerosis 12/05/2007    ONSET DATE: 01/14/2023 (date of referral)  REFERRING DIAG: G20.A1 (ICD-10-CM) - Parkinson's disease, unspecified whether dyskinesia present, unspecified whether manifestations fluctuate  THERAPY DIAG:  Muscle weakness (generalized)  Other lack of coordination  Tremor  Attention and concentration deficit  Rationale for Evaluation and Treatment: Rehabilitation  SUBJECTIVE:   SUBJECTIVE STATEMENT: Pt reports he would be interested in learning hand tricks that would help him with his hand strength. He finds that he drops things from his hands when his attention is divided. He also has difficulty remembering conversations. It is difficult for him to read TV captions. He would love to be able to get back to reading including reading for his bible study. When he is typing, he finds that he hits some buttons more than once as desired. This  also is a problem with his phone.   Pt accompanied by:  wife, [name]  PERTINENT HISTORY: "Mr. Shisler is an 82 year old man who presents for follow-up after hip fracture.    1) Left hip fracture:  -not walking much, but can walk with rolling walker I condominium unit.  -his wife accompanies him today and provides most of history -his walking discharge is very short.  -no longer painful -has weakness in bilateral lower extremities -no more falls.    2) Parkinson's: -sometimes muscles don't cooperate the way they should. -he has vascular parkinsonism -has stooped posture   3) Right leg weakness: -when walking.  -wife is nervous about him falling -he was in skilled rehab and was discharged June 8th  -they have not yet set up outpatient therapy -he has not had any professional therapy since discharge on June 8th.    4) 6 bottom teeth removed -has more of a challenge in  forming words.    5) Neuroendocrine tumor -being treated with monthly octreotide -no current symptoms currently -was having persistent abdominal pain- they thought it was a flare of diverticulitis but this was not present -CBGs increase because of octreotide- CBGs as high as 250/260 -he asks for icecream   He was  diagnosed with a vascular Parkinsonism in Jan 2022 and has history of CVA in June 2021 per pt report and spouse. Pt fell and sustained hip fx on 02/08/22 and pt underwent left  IM nailing.  04/30/22:  hospitalized with blood clot LLE--changed meds and cleared to resume OT/PT."  PRECAUTIONS: Fall  WEIGHT BEARING RESTRICTIONS: No  PAIN:  Are you having pain? No  FALLS: Has patient fallen in last 6 months? No  Lives with: lives with their spouse Lives in: Other Condo Stairs:  On the 10th floor, there are 2 elevators  Has following equipment at home: Dan HumphreysWalker - 2 wheeled, shower chair, Grab bars, and transport chair   PLOF: Independent with household mobility with device and Needs assistance with ADLs  Wife helps with ADLs, pt can get dressed and bathe himself it just takes longer. "Socks take forever"   PATIENT GOALS: Improve UE functional use  OBJECTIVE:   HAND DOMINANCE: Right  ADLs: Overall ADLs: min to moderate assistance (requires more assistance due to time constraints)  IADLs: Handwriting: 25% legible, Increased time, and Severe micrographia  MOBILITY STATUS: Needs Assist: uses transport chair, dependent for navigation  ACTIVITY TOLERANCE: Activity tolerance: fair  FUNCTIONAL OUTCOME MEASURES: PSFS:  COORDINATION: Box and Blocks:  Right 31 blocks, Left 34 blocks  UE ROM:  WFL  UE MMT:   WFL  SENSATION: Reports paresthesias in R hand all fingers  MUSCLE TONE: not fully tested this visit  COGNITION: Overall cognitive status: Impaired  OBSERVATIONS: Bradykinesia and Postural tremors   TODAY'S TREATMENT:                                                                                                                               N/A this date  PATIENT EDUCATION: Education details: OT POC Person educated: Patient and Spouse Education method: Explanation Education comprehension: verbalized understanding  HOME EXERCISE PROGRAM: N/a this date  GOALS:  SHORT TERM GOALS: Target date: 03/11/2023    Pt will be independent with PD specific HEP.  Baseline: not yet initiated Goal status: INITIAL  2.  Pt will verbalize understanding of adapted strategies to maximize safety and independence with ADLs/IADLs.  Baseline: not yet initiated Goal status: INITIAL  3.  Pt will write a sentence with no significant decrease in size and maintain 50% legibility.  Baseline: 25% legibility Goal status: INITIAL  4.  Will assess 9 hole peg and set appropriate LTG.  Baseline:  Goal status: INITIAL  5.  Will assess 3 button/button time and set appropriate LTG.  Baseline:  Goal status: INITIAL  6.  Will assess PPT#4 (don/doff jacket and set appropriate  goal) Baseline:  Goal status: INITIAL  LONG TERM GOALS: Target date: 04/22/2023    Pt will be able to place at least 35 blocks using each hand with completion of Box and Blocks test. Baseline: Right 31 blocks, Left 34 blocks Goal status: INITIAL  2.  Patient will report at least two-point increase in average PSFS score or at least three-point increase in a single activity score indicating functionally significant improvement given minimum detectable change. Baseline: 3 total score (see above for single activity scores) Goal status: INITIAL  3.  Pt will verbalize understanding of ways to prevent future PD related complications and PD community resources.  Baseline: not yet initiated Goal status: INITIAL  4.  Pt will write a sentence with no significant decrease in size and maintain 75% legibility.  Baseline: 25% legibility Goal status: INITIAL   ASSESSMENT:  CLINICAL IMPRESSION: Patient is a 82 y.o. male who was seen today for occupational therapy evaluation for PD. 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 Patient currently presents below baseline level of functioning demonstrating functional deficits and impairments as noted below. Pt would benefit from skilled OT services in the outpatient setting to work on impairments as noted below to help pt return to PLOF as able.    PERFORMANCE DEFICITS: in functional skills including ADLs, IADLs, coordination, sensation, strength, pain, Fine motor control, Gross motor control, mobility, balance, endurance, decreased knowledge of use of DME, and UE functional use, cognitive skills including attention, memory, problem solving, and safety awareness.  IMPAIRMENTS: are limiting  patient from ADLs, IADLs, rest and sleep, leisure, and social participation.   COMORBIDITIES:  may have co-morbidities  that affects occupational performance. Patient will benefit from skilled OT to address above impairments and improve overall function.  MODIFICATION OR ASSISTANCE TO COMPLETE EVALUATION: Min-Moderate modification of tasks or assist with assess necessary to complete an evaluation.  OT OCCUPATIONAL PROFILE AND HISTORY: Problem focused assessment: Including review of records relating to presenting problem.  CLINICAL DECISION MAKING: LOW - limited treatment options, no task modification necessary  REHAB POTENTIAL: Fair given PD prognosis  EVALUATION COMPLEXITY: Low    PLAN:  OT FREQUENCY: 2x/week  OT DURATION: 10 weeks  PLANNED INTERVENTIONS: self care/ADL training, therapeutic exercise, therapeutic activity, manual therapy, passive range of motion, balance training, functional mobility training, splinting, ultrasound, paraffin, fluidotherapy, moist heat, patient/family education, cognitive remediation/compensation, energy conservation, coping strategies training, DME and/or AE instructions, and Re-evaluation  RECOMMENDED OTHER SERVICES: none at this time  CONSULTED AND AGREED WITH PLAN OF CARE: Patient and family member/caregiver  PLAN FOR NEXT SESSION: Review PD handouts/HEP from previous therapy episode   Delana Meyer, OT 02/09/2023, 5:43 PM

## 2023-02-14 DIAGNOSIS — E1142 Type 2 diabetes mellitus with diabetic polyneuropathy: Secondary | ICD-10-CM | POA: Diagnosis not present

## 2023-02-14 DIAGNOSIS — L603 Nail dystrophy: Secondary | ICD-10-CM | POA: Diagnosis not present

## 2023-02-14 DIAGNOSIS — E119 Type 2 diabetes mellitus without complications: Secondary | ICD-10-CM | POA: Diagnosis not present

## 2023-02-14 DIAGNOSIS — I739 Peripheral vascular disease, unspecified: Secondary | ICD-10-CM | POA: Diagnosis not present

## 2023-02-14 DIAGNOSIS — E1151 Type 2 diabetes mellitus with diabetic peripheral angiopathy without gangrene: Secondary | ICD-10-CM | POA: Diagnosis not present

## 2023-02-15 ENCOUNTER — Encounter: Payer: Self-pay | Admitting: Adult Health

## 2023-02-15 ENCOUNTER — Ambulatory Visit (INDEPENDENT_AMBULATORY_CARE_PROVIDER_SITE_OTHER): Payer: Medicare (Managed Care) | Admitting: Adult Health

## 2023-02-15 ENCOUNTER — Ambulatory Visit: Payer: Medicare (Managed Care) | Admitting: Speech Pathology

## 2023-02-15 ENCOUNTER — Encounter: Payer: Self-pay | Admitting: Physical Therapy

## 2023-02-15 ENCOUNTER — Ambulatory Visit: Payer: Medicare (Managed Care) | Admitting: Physical Therapy

## 2023-02-15 VITALS — BP 99/66 | HR 67 | Ht 68.0 in

## 2023-02-15 VITALS — BP 104/65 | HR 70

## 2023-02-15 DIAGNOSIS — R2689 Other abnormalities of gait and mobility: Secondary | ICD-10-CM

## 2023-02-15 DIAGNOSIS — R2681 Unsteadiness on feet: Secondary | ICD-10-CM

## 2023-02-15 DIAGNOSIS — G214 Vascular parkinsonism: Secondary | ICD-10-CM

## 2023-02-15 DIAGNOSIS — R293 Abnormal posture: Secondary | ICD-10-CM

## 2023-02-15 DIAGNOSIS — M6281 Muscle weakness (generalized): Secondary | ICD-10-CM

## 2023-02-15 NOTE — Therapy (Signed)
OUTPATIENT PHYSICAL THERAPY NEURO TREATMENT   Patient Name: Jordan [name] MRN: [id number] DOB:11-Jan-1941, 82 y.o., male Today's Date: 02/15/2023   PCP: Johny Blamer, MD  REFERRING PROVIDER:  Horton Chin, MD  END OF SESSION:  PT End of Session - 02/15/23 1619     Visit Number 2    Number of Visits 9    Date for PT Re-Evaluation 04/10/23   due to delay in scheduling   Authorization Type [employer]    PT Start Time 1617    PT Stop Time 1700    PT Time Calculation (min) 43 min    Equipment Utilized During Treatment Gait belt    Activity Tolerance Patient tolerated treatment well    Behavior During Therapy WFL for tasks assessed/performed             Past Medical History:  Diagnosis Date   Arthritis    R shoulder, bone spur   Arthritis    "hands" (03/25/2016)   Chronic diastolic heart failure    Chronic lower back pain    Coronary heart disease    Dr Garnette Scheuermann   Diverticulitis 12/2021   DVT (deep venous thrombosis) 05/2022   left leg   H/O cardiovascular stress test    perhaps last one was 2009   Heart murmur    12/29/11 echo: mild MR, no AS, trivial TR   History of gout    Hyperlipidemia    Hypertension    saw Dr. Mendel Ryder last earl;y- 2014, cardiac cath. last done ?2009, blocks seen didn't require any intervention at that point.   Narcolepsy    [name] 04-11-97; Mean Latency 1.79min, SOREM 2   OSA on CPAP    NPSG 12-13-98 AHI 22.7   PONV (postoperative nausea and vomiting)    Presence of permanent cardiac pacemaker    Shortness of breath    with exertion    Stroke    Type II diabetes mellitus    Past Surgical History:  Procedure Laterality Date   CARDIAC CATHETERIZATION  2009?   EP IMPLANTABLE DEVICE N/A 03/25/2016   Procedure: Pacemaker Implant - Dual Chamber;  Surgeon: Marinus Maw, MD;  Location: [facility] INVASIVE CV LAB;  Service: Cardiovascular;  Laterality: N/A;   FRACTURE SURGERY     HIP FRACTURE SURGERY     INSERT / REPLACE / REMOVE  PACEMAKER  03/24/2016   INTRAMEDULLARY (IM) NAIL INTERTROCHANTERIC Left 02/08/2022   Procedure: INTRAMEDULLARY (IM) NAIL INTERTROCHANTRIC;  Surgeon: Cammy Copa, MD;  Location: [facility] OR;  Service: Orthopedics;  Laterality: Left;   IR KYPHO LUMBAR INC FX REDUCE BONE BX UNI/BIL CANNULATION INC/IMAGING  12/29/2021   IR RADIOLOGIST EVAL & MGMT  12/15/2021   IR RADIOLOGY PERIPHERAL GUIDED IV START  07/14/2022   IR US GUIDE VASC ACCESS RIGHT  07/15/2022   LEFT AND RIGHT HEART CATHETERIZATION WITH CORONARY ANGIOGRAM N/A 06/05/2014   Procedure: LEFT AND RIGHT HEART CATHETERIZATION WITH CORONARY ANGIOGRAM;  Surgeon: Lesleigh Noe, MD;  Location: [facility] CATH LAB;  Service: Cardiovascular;  Laterality: N/A;   NASAL FRACTURE SURGERY     SHOULDER ARTHROSCOPY WITH ROTATOR CUFF REPAIR AND SUBACROMIAL DECOMPRESSION Right 08/16/2013   Procedure: SHOULDER ARTHROSCOPY WITH ROTATOR CUFF REPAIR AND SUBACROMIAL DECOMPRESSION;  Surgeon: Mable Paris, MD;  Location: [facility] OR;  Service: Orthopedics;  Laterality: Right;  Right shoulder arthroscopy rotator cuff repair, subacromial decompression.   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 07/23/2022   Acute deep vein thrombosis (DVT) of left lower extremity 04/29/2022   Closed intertrochanteric fracture of left hip, initial encounter    Closed left hip fracture (HCC) secondary to fall at home 02/08/2022   Paroxysmal atrial fibrillation 02/08/2022   Debility 01/08/2022   Constipation 01/07/2022   Physical deconditioning 01/07/2022   Acute metabolic encephalopathy 01/04/2022   Hypomagnesemia 01/04/2022   Pancreatitis 12/30/2021   Parkinson's disease 12/21/2021   Parkinson disease 12/18/2021   Frequent falls 12/18/2021   History of CVA (cerebrovascular accident) 12/18/2021   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   Hallucinations, visual 07/30/2021   Acetabulum fracture, left  06/25/2021   Acute hip pain, left 06/24/2021   Fall at home, initial encounter 06/24/2021   Leukocytosis 06/24/2021   Chronic diastolic heart failure    Sinusitis, acute maxillary 06/24/2020   AKI (acute kidney injury)    Labile blood glucose    Cough    Benign essential HTN    Skin lesion of hand    Hypoalbuminemia due to protein-calorie malnutrition    Controlled type 2 diabetes mellitus with hyperglycemia, without long-term current use of insulin    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   Morbid obesity    Acute blood loss anemia    Acute thalamic infarction 04/15/2020   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   TIA (transient ischemic attack) 02/17/2018   Type 2 diabetes mellitus with hyperlipidemia 02/17/2018   Insomnia 02/01/2018   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   Chronotropic incompetence 07/09/2014   Dyspnea on exertion 03/03/2014   HLD (hyperlipidemia) 12/13/2007   Essential hypertension 12/13/2007   Class 1 obesity 12/05/2007   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   Coronary atherosclerosis 12/05/2007    ONSET DATE: 01/14/2023  REFERRING DIAG: G20.A1 (ICD-10-CM) - Parkinson's disease, unspecified whether dyskinesia present, unspecified whether manifestations fluctuate   THERAPY DIAG:  Muscle weakness (generalized)  Other abnormalities of gait and mobility  Unsteadiness on feet  Abnormal posture  Rationale for Evaluation and Treatment: Rehabilitation  SUBJECTIVE:                                                                                                                                                                                             SUBJECTIVE STATEMENT: Was off on his carbidopa-levodopa today. Not having a good leg day. Saw the neurologist today and reports that they want him to drink more water.   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 No  Vitals:   02/15/23 1625  BP: 104/65  Pulse: 70    PRECAUTIONS: Fall  WEIGHT BEARING RESTRICTIONS: No  FALLS: Has patient fallen in last 6 months? No  LIVING ENVIRONMENT: Lives with: lives with their spouse Lives in: Other Condo Stairs:  On the 10th floor, there are 2 elevators  Has following equipment at home: Dan Humphreys - 2 wheeled, shower chair, Grab bars, and transport chair  PLOF: Independent with household mobility with device and Needs assistance with ADLs Wife helps with ADLs, pt can get dressed and bathe himself it just takes longer. "Socks take forever"   PATIENT GOALS: Pt wants to get back to driving. Wants to improve strength and endurance for walking.   OBJECTIVE:    COGNITION: Overall cognitive status: Impaired   SENSATION: WFL  COORDINATION: Heel to shin; Bradykinetic with LLE   POSTURE: rounded shoulders, forward head, increased thoracic kyphosis, posterior pelvic tilt, and flexed trunk   LOWER EXTREMITY ROM:     Decr AROM with LLE due to weakness, especially with knee extension.  LOWER EXTREMITY MMT:    MMT Right Eval Left Eval  Hip flexion 4 3+  Hip extension    Hip abduction 4 3+  Hip adduction 5 4+  Hip internal rotation    Hip external rotation    Knee flexion 5 4  Knee extension 4+ 3-  Ankle dorsiflexion 4+ 4  Ankle plantarflexion    Ankle inversion    Ankle eversion    (Blank rows = not tested)  All tested in sitting.    TODAY'S TREATMENT:          Performed stand step transfers from transport chair to mat table with RW with CGA.                                                                                                                          Access Code: [email or web address] URL: [email or web address] Date: 02/15/2023 Prepared by: Sherlie Ban  Revised HEP and updated as appropriate for strength. Pt had not been performing previous HEP. See MedBridge for further details. Educated on purpose of each exercise.   Exercises - Seated Scapular Retraction  - 1 x daily - 7 x weekly - 2 sets - 10 reps - cues for technique and looking straight ahead (spotting a target ahead on the wall as pt just looks down at the ground) - Seated Long Arc Quad  - 1 x daily - 7 x weekly - 2 sets - 10 reps - pt more fatigued with LLE  - Seated March  - 1 x daily - 7 x weekly - 2 sets - 10 reps - pt with decr AROM with LLE, needing a visual cue on how high to lift up leg  -  Sit to Stand with Armchair  - 2 x daily - 7 x weekly - 2 sets - 5 reps - in standing cues for upright posture and looking straight ahead  - Supine Bridge  - 1 x daily - 7 x weekly - 1-2 sets - 10 reps - pt with very minimal AROM, but cued for glute squeeze and also performing with a pillow between legs for hip ADD  Pt needing min A for LLE/trunk for bed mobility for supine <> sit, pt takes incr time. Pt needing a second initially in sitting due to dizziness.    PATIENT EDUCATION: Education details: Reviewed and updated HEP, importance of exercise/walking at home, looking into getting a life alert (pt reports he was planning on doing this) Person educated: Patient and Spouse  Education method: Explanation, Demonstration, Verbal cues, and Handouts Education comprehension: verbalized understanding, returned demonstration, and needs further education  HOME EXERCISE PROGRAM:  And walking program with RW at home   Access Code: [email or web address] URL: [email or web address] Date: 02/15/2023 Prepared by: Sherlie Ban  Exercises - Seated Scapular Retraction  - 1 x daily - 7 x weekly - 2 sets - 10 reps - Seated Long Arc Quad  - 1 x daily - 7 x weekly - 2 sets - 10 reps -  Seated March  - 1 x daily - 7 x weekly - 2 sets - 10 reps - Sit to Stand with Armchair  - 2 x daily - 7 x weekly - 2 sets - 5 reps - Supine Bridge  - 1 x daily - 7 x weekly - 1-2 sets - 10 reps  GOALS: Goals reviewed with patient? Yes  SHORT TERM GOALS: ALL STGS = LTGS   LONG TERM GOALS: Target date: 03/09/2023         Pt will be independent with final HEP with wife supervision/assist in order to build upon functional gains made in therapy. Baseline:  Goal status: INITIAL  2.  Pt will improve 5x sit<>stand to less than or equal to 25 sec with UE support to demonstrate improved functional strength and transfer efficiency.  Baseline: 32.79 seconds with BUE support, performs from transport chair  Goal status: INITIAL  3.  Pt will improve gait speed with RW to at least 1.3 ft/sec in order to demo improved household mobility. Baseline: 31.15 seconds with RW= 1.05 ft/sec  Goal status: INITIAL  4.  with RW to be assessed with goal written.  Baseline:  Goal status: INITIAL    ASSESSMENT:  CLINICAL IMPRESSION: Today's skilled session focused on reviewing and updating pt's previous HEP as he had not been performing at home. Updated to only have 5 exercises for hopefully incr compliance at home. Educated on purpose of each exercise. Pt more fatigued when using LLE. Will continue to progress towards LTGs.    OBJECTIVE IMPAIRMENTS: Abnormal gait, cardiopulmonary status limiting activity, decreased activity tolerance, decreased balance, decreased cognition, decreased coordination, decreased endurance, decreased mobility, difficulty walking, decreased ROM, decreased strength, dizziness, impaired flexibility, impaired UE functional use, and postural dysfunction.   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   REHAB POTENTIAL: Fair due to chronicity of condition, decr carryover at home.   CLINICAL DECISION MAKING: Evolving/moderate complexity  EVALUATION COMPLEXITY: Moderate  PLAN:  PT FREQUENCY: 2x/week  PT DURATION: 8 weeks - due to potential delay in scheduling, only expect 4 weeks   PLANNED INTERVENTIONS: Therapeutic exercises, Therapeutic activity, Neuromuscular re-education, Balance training, Gait training, Patient/Family education, Self Care, Vestibular training, Canalith repositioning, DME instructions, Manual therapy, and Re-evaluation  PLAN FOR NEXT SESSION: Assess and write goal, Work on gait with RW for endurance, standing tasks/functional strength.    Drake Leach, PT, DPT 02/15/2023, 5:14 PM

## 2023-02-15 NOTE — Progress Notes (Signed)
PATIENT: Jordan [name] DOB: [birth year]  REASON FOR VISIT: follow up HISTORY FROM: patient PRIMARY NEUROLOGIST: Dr. Pearlean Brownie  Chief Complaint  Patient presents with   Follow-up    Rm 19. Accompanied by wife, [name]. Patient has concerns about Sinemet IR. He c/o leg weakness and constipation.     HISTORY OF PRESENT ILLNESS: Today 02/15/23  Jordan [name] is a 82 y.o. male who has been followed in this office for vascular parkinsons. Returns today for follow-up. Here today with his wife. Reports that this AM he felt weak in the lower legs. Does not drink much water. Issues with constipation- doesn't want to take a stool softner daily d/t fear of having an accident. Some trouble swallowing but not consistent. Some trouble with pills but now uses applesauce and that works better. Uses a walker at home and wheelchair when out and about.  Reports that he was having vivid dreams but Seroquel has helped with that.  He typically goes to bed around 11 PM and wakes up around 11 AM.  His first dose of Sinemet is around 10:30 AM, 230 and 630.  He takes Sinemet CR around 9 PM.  Wife knows that he has some trouble with leg weakness in the late afternoons.  She is wondering if Sinemet IR should be increased  HISTORY Update 11/23/2022: He returns for follow-up after last visit 3 months ago.  He is accompanied by his wife.  They have noticed slight improvement after increasing the Sinemet dose to 25/101 and half tablets 3 times daily.  He has noticed improvement in his mobility and does not feel as weak or fatigued.  He denies any increased side effects in the form of nausea, dizziness but does have mild sleepiness.  He denies any delusions or hallucinations.  Still needs help with many activities of daily living but he can feed himself and clean himself In the toilet.  He complains of fatigability and he does use his CPAP regularly but has a appointment coming up to see Dr. Maple Hudson denies any recurrent symptoms  of stroke TIA.  His blood pressure tends to be on the low side and he denies he is short distances.  He does agree to some slight increase in drooling particularly in the morning.  He remains on Xarelto for his DVT and is tolerating it well without bruising or bleeding.  Sleep well most of the night. Update 08/19/22 : He returns for follow-up after last visit with Shanda Bumps nurse practitioner 1 month ago.  He is accompanied by his wife.  She states that for the last week or so she has noticed worsening of his parkinsonian symptoms.  He appears extremely fatigued and weak.  He has trouble walking down the hallway from the bedroom to the living room and has to stop.  At times he has difficult time getting started with any walks.  Walks with increased shuffling and small steps.  He has mild intermittent tremor but it is not as bothersome.  Patient had been on amantadine 200 mg daily but he was having some hallucinations so this was discontinued a week or 2 ago and may have contributed to his worsening.  He is currently on Sinemet IR 25/101 tablet 3 times daily and Sinemet CR 25/101 tablet at bedtime.  He seems to be tolerating these medications fairly well without significant dizziness, sleepiness or other side effects.  He has had no recent stroke or TIA symptoms.  He remains on Xarelto for DVT  treatment. Update 07/13/2022 JM: Patient returns for follow up visit accompanied by his wife   He has been relatively stable from neurological standpoint over the past few months.  He has remained on Sinemet 1 tab 3 times daily and Sinemet CR 1 tab nightly. Tolerating without side effects. Does have some worsening stiffness and slowness pursing in the morning and in the evening/bedtime. Can have occasional difficulty with right leg hesitancy/coordination especially in the evening. Use of RW for short distance ambulation otherwise use of w/c.  Recently completed outpatient therapies.  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  He  does try to maintain some ADLs independently but does need some assistance by his wife.  He does fatigue quickly after some ADLs such as bathing or dressing.  Previously having issues with visual hallucinations but these have greatly subsided since starting Seroquel 12.5 mg nightly, does still have some occasional visual hallucinations but nonthreatening.  Does have frequent talking and acting out (punching and kicking) while sleeping.  He is on melatonin 10 mg nightly.  Admits to limited daytime physical activity, he usually sits and watches TV during the day, can fall asleep easily in chair unless watching something that keeps his interest. 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 Maple Hudson back in March.  He does endorse nightly use of CPAP.     He has had multiple hospitalizations since prior visit   Hospitalized 12/2021 for frequent falls and hallucinations, started on Seroquel at that time per psychiatry recommendations and felt hallucinations in setting of vascular parkinsonism.  He was discharged to SNF for deconditioning.   He underwent kyphoplasty on 2/28 for lumbar compression fracture by Dr. Corliss Skains   hospitalized again 3/1 for generalized abdominal pain and frequent falls, discharged to CIR on 3/10. During CIR stay, he was started on amantadine to help with activation and remained on low-dose Seroquel to help with sleep and decrease nighttime hallucinations.   Suffered a closed left hip fracture post fall 01/2022 and underwent surgical repair.  He was discharged to SNF rehab and returned home on 6/8.   Diagnosed with left common femoral vein, SF junction, femoral vein, proximal profunda vein, popliteal vein and posterior tibial vein DVT on 6/28. Placed on Xarelto with outpatient vascular surgery follow-up. Had issues with hematuria, improved after lowering dose to therapeutic dose  but still having issues.  Recently transition to Lovenox for upcoming liver biopsy, wife reports resolution of discolored urine since stopping Xarelto.  Plans on pursuing cystoscopy in the near future.             History provided for reference purposes only Update 12/08/2021 JM: Returns for follow-up visit after prior visit 3 months ago for concern of worsening gait and functioning. Hx of prior stroke and parkinsonism. At prior visit, adjusted Sinemet dosage - currently on 25-100 1 tab 3x daily and ER 25-100 nightly. Per patient and wife, gait has remained the same - denies worsening but also denies any improvement. Tolerating Sinemet without side effects but no noticeable difference since dose change 3 months ago. Recently seen by Dr. Ethelene Hal for lower back issues and plans to start PT for primarily lumbar spine on 3/2. Continues to use RW but limited ambulate and only short distance. Does c/o neck and shoulder pain which is chronic but gradually worsening. Limited neck ROM. Denies radiculopathy. No prior cervical imaging. At times, will have  difficulty holding head up and sits in a hunched over position. He will have difficulty holding on to small, especially lighter objects, right > left since stroke. C/o shortness of breath after talking for prolonged periods of time which has been present over the past 4-5 months. Denies SOB on exertion but wife believes that he does. Wife also notes voice being softer. Denies any swallowing difficulties. Does have chronic diabetic neuropathy with some worsening over the past 6 months. Recent A1c satisfactory. BUE tremors stable. He also c/o continued fatigue.    Stable from stroke standpoint without new stroke/TIA symptoms. Compliant on Plavix and atorvastatin without side effects.  Blood pressure today 112/75. Recent lipid panel satisfactory.    No further concerns at this time     Update 09/14/2021 JM: Returns for sooner scheduled visit accompanied by his wife.   Concern regarding worsening gait and functioning since prior visit. He did have a fall on 8/23 with subsequent left acetabular fracture - ortho eval recommended nonoperative management.  Admitted to SNF rehab and discharged on 9/23. Reports since he returned home from rehab, gait has gradually worsened (increased shuffling and decreased step height) and has been requiring more assistance.  Working with [facility] PT initially 2x weekly now only 1x per week. Suffered a fall 10/29. Eval at [facility] walk-in clinic with xray showing possible compression fracture per wife report (unable to personally view via epic).  He is currently awaiting eval by orthopedics. He has had f/u with ortho for hip fx since returning home and f/u visit scheduled end of this month. Per wife, currently limited ambulation and exercise - will only walk to go to the bathroom. PT currently on hold until further eval with our office and ortho.    Wife apparently increased Sinemet to 3 times daily (currently taking at 10am, 4pm and 10pm) approx 1 month ago. Per wife, slight improvement of gait and functioning since increase during the day but does have worsening gait and stiffness upon awakening. Follows with Dr. 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 Also reports hallucinations during SNF rehab stay - this has since improved.  Wife questions if Sinemet dosage needs to be adjusted   Eval by Dr. Frances Furbish for second opinion of gait disorder and wife concern of Parkinson's disease. 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      Update 05/12/2021 Dr. Pearlean Brownie; He returns for follow-up after last visit with Shanda Bumps nurse practitioner in April 2022.  Is accompanied by his wife.  Patient has had no recurrent stroke or TIA symptoms.  He continues to have mild  dizziness and imbalance in the right side paresthesias and diminished fine motor skills.  He occasionally has tingling of his fingers or his feet but these are not bothersome.  He is quite active and works out 3 days a week at [employer].  He remains on Plavix which is tolerating well without bruising or bleeding.  He takes Lipitor 20 mg daily and is tolerating it well.  He states his primary care physician did check his lipid profile and A1c about 5 weeks ago but I do not have those results and he was told they were satisfactory.  He does have chronic low back pain and also complains of fatigue and tiredness.  He has not discussed this with primary physician had any  work-up done for this.  Patient has history of parkinsonian tremor and has a prescription for Sinemet 25/103 times daily but states that he gets it only once a day or so as he forgets to take it and other times.  Tremor seems intermittent and not constant and is not functionally disabling.  Wife feels that he does have a shuffling gait particularly when he is tired.  He however denies significant bradykinesia and is able to get up and walk by himself easily without assistance.  He denies drooling of saliva or micrographia.  He has not had any particular worsening of his tremors or other parkinsonian symptoms in recent months.     Update 02/19/2021 JM: Jordan [name] returns for acute visit per wife request due to recent onset of increased fatigue   Known hx of daytime fatigue with known OSA on CPAP and narcolepsy. Per wife, over the past 3 to 4 days, he has been experiencing increased daytime sleepiness where he is sleeping majority of the day as well as sleeping well at night.  Reports PCP recently changed Adderall IR to XR for narcolepsy but will occasionally miss a dose.  Also reports recently being started on sertraline 50 mg daily on 4/14 which he will take in the morning.  He also recently started PT on Monday for gait impairment and BPPV.  No  other associated symptoms such as confusion, worsening cognition, weakness, numbness/tingling, speech deficit, visual changes or gait changes.  No further concerns at this time     Update 11/27/2020 JM: Jordan [name] returns per patient request for sooner scheduled visit due to continued gait impairment and lower back pain.  He is currently unaccompanied.  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  Tremors have been stable without worsening.  He does feel as though his ambulation is worse with increased back pain. He did have a mechanical fall seen in ED on 11/05/2020 thankfully without injury.   He does have chronic back pain previously doing routine exercise prior to his stroke but has not returned back to routine exercise since. He admits to sedentary life style with limited physical activity. He will fall asleep easily during the day watching TV and occasional issues with insomnia. 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  No further concerns at this time.   Update 09/15/2020 JM: Jordan [name] returns for stroke follow-up accompanied by his wife.  He has been doing well since prior visit with residual decreased right hand dexterity and decreased sensory right fingertips.  Denies new or worsening stroke/TIA symptoms. He does complain of short-term memory loss such as forgetting an actors name or random fact.  He also continues to experience R>L upper extremity tremors and gait impairment with imbalance with fall in 06/2020 resulting in nasal bone fracture.  Wife reports shuffling type gait when fatigued.  Remains on Plavix and atorvastatin for secondary stroke prevention without side effects.  Blood pressure today 126/73.  Glucose level stable per patient.  Reports ongoing compliance with CPAP for OSA management managed by pulmonology Dr. Maple Hudson.  History of narcolepsy on Adderall 10 mg 1-3x daily  but does not take consistently.  He complains of excessive daytime fatigue, vivid dreams and occasional insomnia. Per patient, he has not trialed any other medication for narcolepsy.  No further concerns at this time.   Update 05/15/2020 JM: Jordan [name] is being seen for  hospital follow-up accompanied by his wife.  He was discharged home from CIR on 04/29/2020 after 11 day stay.  Residual deficits of mild imbalance and mildly decreased sensation R finger tips and decreased right hand fine motor skills. Does report ongoing improvement currently working with home health PT. he has had gait impairment ongoing and right hand decreased fine motor control with tremors and was evaluated by Dr. Frances Furbish on 12/11/2019 for possible Parkinson's but was not felt this was Parkinson's related.  He does continue to use cane for ambulation and denies any recent falls.  He is questioning return to driving as he was advised no driving at discharge until further cleared.  Completed 3 weeks DAPT and continues on Plavix alone without bleeding or bruising.  Continues on atorvastatin 40 mg daily without myalgias.  Blood pressure today 118/84.  Glucose levels stable.  Endorses ongoing use of CPAP for OSA management.  No further concerns at this time.    Stroke admission 04/14/2020 Jordan [name] is a 82 y.o. male with history of  diabetes, stroke, hypertension, hyperlipidemia, permanent cardiac pacemaker  who presented on 04/14/2020 with R sided decreased sensation.  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  Previously on clopidogrel and recommended DAPT for 3 weeks then clopidogrel alone.  Presented in hypertensive urgency with BP 204/116 and recommended long-term BP goal normotensive range.  LDL 57 and recommended continuation of atorvastatin 40 mg daily.  Uncontrolled DM with A1c 7.9.  Other stroke risk factors include prior stroke 01/2018 with multifocal punctate infarcts concerning for  embolic pattern, former tobacco use, EtOH use, obesity, CAD, OSA on CPAP and permanent cardiac pacemaker.  Evaluated by therapies who recommended CIR for residual deficits of imbalance and safety affecting ADLs and mobility.  Discharged to CIR on 04/18/2020.   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 Small vessel disease. Sinus dz. ASPECTS 10.    CTA head & neck no LVO. Aortic atherosclerosis.  MRI  L thalamic infarct. Small vessel disease. Atrophy.  2D Echo EF 65-70%. No source of embolus. LA mildly dilated.  Pacemaker interrogation no afib LDL 57 -continued atorvastatin 40 mg daily HgbA1c 7.9 Lovenox 40 mg sq daily for VTE prophylaxis clopidogrel 75 mg daily prior to admission, now on aspirin 81 mg daily and clopidogrel 75 mg daily. Continue DAPT x 3 weeks then plavix alone   Therapy recommendations:  CIR Disposition:  CIR  REVIEW OF SYSTEMS: Out of a complete 14 system review of symptoms, the patient complains only of the following symptoms, and all other reviewed systems are negative.  ALLERGIES: Allergies  Allergen Reactions   Amantadines Other (See Comments)    Mental changes   Clonazepam Other (See Comments)    Mental changes   Zoloft [Sertraline] Other (See Comments)    Mental changes    HOME MEDICATIONS: Outpatient Medications Prior to Visit  Medication Sig Dispense Refill   acetaminophen (TYLENOL) 500 MG tablet Take 500-1,000 mg by mouth every 6 (six) hours as needed for moderate pain or mild pain.     atorvastatin (LIPITOR) 40 MG tablet Take 0.5 tablets (20 mg total) by mouth daily. 30 tablet 0   bifidobacterium infantis (ALIGN) capsule Take 1 capsule by mouth daily.     Camphor-Menthol-Methyl Sal (HM SALONPAS PAIN RELIEF EX) Apply 1 patch topically daily as needed (Back pain).     carbidopa-levodopa (SINEMET CR) 50-200 MG  tablet TAKE 1 TABLET BY MOUTH EVERYDAY AT BEDTIME 90 tablet 1   carbidopa-levodopa  (SINEMET IR) 25-100 MG tablet Take 1.5 tablets by mouth 3 (three) times daily. 180 tablet 2   cholecalciferol (VITAMIN D3) 25 MCG (1000 UNIT) tablet Take 1,000 Units by mouth daily.     clotrimazole-betamethasone (LOTRISONE) cream Apply 1 Application topically daily as needed (Rash).     docusate sodium (COLACE) 100 MG capsule Take 1 capsule (100 mg total) by mouth 2 (two) times daily. (Patient taking differently: Take 100 mg by mouth daily as needed for mild constipation or moderate constipation. As Needed) 10 capsule 0   feeding supplement, GLUCERNA SHAKE, (GLUCERNA SHAKE) LIQD Take 237 mLs by mouth 2 (two) times daily between meals. Alternate with ensure Diabetic     furosemide (LASIX) 20 MG tablet USE AS NEEDED FOR UP TO 5 DAYS IF SWELLING IN LEGS RECURS. CONTACT CARDIOLOGY IS SWELLING DOES NOT IMPROVE OR GETS WORSE. 15 tablet 0   lidocaine (LIDODERM) 5 % Place 1 patch onto the skin daily as needed. Purchase over the counter. On for 12 hours and off for 12 hours 30 patch 0   magnesium gluconate (MAGONATE) 500 MG tablet Take 0.5 tablets (250 mg total) by mouth at bedtime. 30 tablet 0   Melatonin 10 MG TABS Take 10 mg by mouth at bedtime.     metFORMIN (GLUCOPHAGE) 1000 MG tablet Take 1 tablet (1,000 mg total) by mouth daily with breakfast. (Patient taking differently: Take 2,000 mg by mouth 2 (two) times daily.) 30 tablet 0   metoprolol tartrate (LOPRESSOR) 25 MG tablet Take 1 tablet (25 mg total) by mouth 2 (two) times daily. (Patient taking differently: Take 12.5 mg by mouth 2 (two) times daily.) 60 tablet 0   modafinil (PROVIGIL) 200 MG tablet Take 1 tablet (200 mg total) by mouth daily. 30 tablet 5   Multiple Vitamin (MULTIVITAMIN WITH MINERALS) TABS tablet Take 1 tablet by mouth daily.     NITROSTAT 0.4 MG SL tablet PLACE 1 TABLET (0.4 MG TOTAL) UNDER THE TONGUE EVERY 5 (FIVE) MINUTES AS NEEDED FOR CHEST PAIN. (Patient taking differently: Place 0.4 mg under the tongue every 5 (five) minutes as  needed for chest pain.) 25 tablet 5   Omega-3 Fatty Acids (OMEGA 3 PO) Take 1,280 mg by mouth daily.     polyethylene glycol (MIRALAX / GLYCOLAX) 17 g packet Take 17 g by mouth daily. 14 each 0   polyvinyl alcohol (LIQUIFILM TEARS) 1.4 % ophthalmic solution Place 1 drop into both eyes as needed for dry eyes. (Patient taking differently: Place 1 drop into both eyes daily as needed for dry eyes.) 15 mL 0   QUEtiapine (SEROQUEL) 25 MG tablet Take 1 tablet (25 mg total) by mouth at bedtime. 30 tablet 0   sitaGLIPtin (JANUVIA) 50 MG tablet Take 50 mg by mouth daily.     Vitamin D, Ergocalciferol, (DRISDOL) 1.25 MG (50000 UNIT) CAPS capsule TAKE 1 CAPSULE (50,000 UNITS TOTAL) BY MOUTH EVERY 7 (SEVEN) DAYS 5 capsule 0   XARELTO 15 MG TABS tablet TAKE 1 TABLET (15 MG TOTAL) BY MOUTH DAILY WITH SUPPER 90 tablet 1   No facility-administered medications prior to visit.    PAST MEDICAL HISTORY: Past Medical History:  Diagnosis Date   Arthritis    R shoulder, bone spur   Arthritis    "hands" (03/25/2016)   Chronic diastolic heart failure    Chronic lower back pain    Coronary heart disease  Dr Garnette Scheuermann   Diverticulitis 12/2021   DVT (deep venous thrombosis) 05/2022   left leg   H/O cardiovascular stress test    perhaps last one was 2009   Heart murmur    12/29/11 echo: mild MR, no AS, trivial TR   History of gout    Hyperlipidemia    Hypertension    saw Dr. Mendel Ryder last earl;y- 2014, cardiac cath. last done ?2009, blocks seen didn't require any intervention at that point.   Narcolepsy    [name] 04-11-97; Mean Latency 1.47min, SOREM 2   OSA on CPAP    NPSG 12-13-98 AHI 22.7   PONV (postoperative nausea and vomiting)    Presence of permanent cardiac pacemaker    Shortness of breath    with exertion    Stroke    Type II diabetes mellitus     PAST SURGICAL HISTORY: Past Surgical History:  Procedure Laterality Date   CARDIAC CATHETERIZATION  2009?   EP IMPLANTABLE DEVICE N/A 03/25/2016    Procedure: Pacemaker Implant - Dual Chamber;  Surgeon: Marinus Maw, MD;  Location[facility] INVASIVE CV LAB;  Service: Cardiovascular;  Laterality: N/A;   FRACTURE SURGERY     HIP FRACTURE SURGERY     INSERT / REPLACE / REMOVE PACEMAKER  03/24/2016   INTRAMEDULLARY (IM) NAIL INTERTROCHANTERIC Left 02/08/2022   Procedure: INTRAMEDULLARY (IM) NAIL INTERTROCHANTRIC;  Surgeon: Cammy Copa, MD;  Location: [facility] OR;  Service: Orthopedics;  Laterality: Left;   IR KYPHO LUMBAR INC FX REDUCE BONE BX UNI/BIL CANNULATION INC/IMAGING  12/29/2021   IR RADIOLOGIST EVAL & MGMT  12/15/2021   IR RADIOLOGY PERIPHERAL GUIDED IV START  07/14/2022   IR US GUIDE VASC ACCESS RIGHT  07/15/2022   LEFT AND RIGHT HEART CATHETERIZATION WITH CORONARY ANGIOGRAM N/A 06/05/2014   Procedure: LEFT AND RIGHT HEART CATHETERIZATION WITH CORONARY ANGIOGRAM;  Surgeon: Lesleigh Noe, MD;  Location: [facility] CATH LAB;  Service: Cardiovascular;  Laterality: N/A;   NASAL FRACTURE SURGERY     SHOULDER ARTHROSCOPY WITH ROTATOR CUFF REPAIR AND SUBACROMIAL DECOMPRESSION Right 08/16/2013   Procedure: SHOULDER ARTHROSCOPY WITH ROTATOR CUFF REPAIR AND SUBACROMIAL DECOMPRESSION;  Surgeon: Mable Paris, MD;  Location: [facility] OR;  Service: Orthopedics;  Laterality: Right;  Right shoulder arthroscopy rotator cuff repair, subacromial decompression.   TONSILLECTOMY      FAMILY HISTORY: Family History  Problem Relation Age of Onset   Sleep apnea Sister    Colon cancer Sister     SOCIAL HISTORY: Social History   Socioeconomic History   Marital status: Married    Spouse name: [name]   Number of children: Not on file   Years of education: Not on file   Highest education level: Not on file  Occupational History   Occupation: [employer]   Occupation: [place] [employer]  Tobacco Use   Smoking status: Former    Packs/day: 2.50    Years: 6.00    Additional pack years: 0.00    Total pack years: 15.00    Types: Cigarettes     Quit date: 06/05/1967    Years since quitting: 55.7   Smokeless tobacco: Never  Vaping Use   Vaping Use: Never used  Substance and Sexual Activity   Alcohol use: Yes    Comment: rare   Drug use: No   Sexual activity: Not Currently  Other Topics Concern   Not on file  Social History Narrative   Lives with wife and son   Right handed  Drinks 1 cups caffeine daily   Social Determinants of Health   Financial Resource Strain: Not on file  Food Insecurity: Not on file  Transportation Needs: Not on file  Physical Activity: Not on file  Stress: Not on file  Social Connections: Not on file  Intimate Partner Violence: Not on file      PHYSICAL EXAM  Vitals:   02/15/23 1516  BP: 99/66  Pulse: 67  Height: 5\' 8"  (1.727 m)   Body mass index is 32.45 kg/m.  Generalized: Well developed, in no acute distress   Neurological examination  Mentation: Alert oriented to time, place, history taking. Follows all commands speech and language fluent Cranial nerve II-XII: Pupils were equal round reactive to light. Extraocular movements were full, visual field were full on confrontational test. Facial sensation and strength were normal. Uvula tongue midline. Head turning and shoulder shrug  were normal and symmetric. Motor: The motor testing reveals 5 over 5 strength of all 4 extremities. Good symmetric motor tone is noted throughout.  Rapid alternating movement intact.  Finger taps and toe taps mildly impaired Sensory: Sensory testing is intact to soft touch on all 4 extremities. No evidence of extinction is noted.  Coordination: Cerebellar testing reveals good finger-nose-finger and heel-to-shin bilaterally.  Gait and station: Patient in a wheelchair today.  Did not bring his walker with him.  We did not attempt to ambulate  DIAGNOSTIC DATA (LABS, IMAGING, TESTING) - I reviewed patient records, labs, notes, testing and imaging myself where available.  Lab Results  Component Value Date    WBC 10.7 (H) 12/17/2022   HGB 13.0 12/17/2022   HCT 39.0 12/17/2022   MCV 90.3 12/17/2022   PLT 281 12/17/2022      Component Value Date/Time   NA 138 12/17/2022 1252   NA 145 07/23/2021 0000   K 4.3 12/17/2022 1252   CL 103 12/17/2022 1252   CO2 23 12/17/2022 1252   GLUCOSE 324 (H) 12/17/2022 1252   BUN 24 (H) 12/17/2022 1252   BUN 14 07/23/2021 0000   CREATININE 0.96 12/17/2022 1252   CREATININE 1.01 08/17/2022 1509   CREATININE 1.16 03/18/2016 1011   CALCIUM 9.2 12/17/2022 1252   PROT 7.0 12/17/2022 1252   ALBUMIN 3.9 12/17/2022 1252   AST 14 (L) 12/17/2022 1252   AST 22 08/17/2022 1509   ALT 8 12/17/2022 1252   ALT 6 08/17/2022 1509   ALKPHOS 79 12/17/2022 1252   BILITOT 0.5 12/17/2022 1252   BILITOT 0.4 08/17/2022 1509   GFRNONAA >60 12/17/2022 1252   GFRNONAA >60 08/17/2022 1509   GFRAA >90 07/23/2021 0000   Lab Results  Component Value Date   CHOL 123 04/29/2022   HDL 44 04/29/2022   LDLCALC 51 04/29/2022   TRIG 141 04/29/2022   CHOLHDL 2.8 04/29/2022   Lab Results  Component Value Date   HGBA1C 8.4 (H) 04/29/2022   Lab Results  Component Value Date   VITAMINB12 695 12/08/2021   Lab Results  Component Value Date   TSH 1.030 12/19/2021      ASSESSMENT AND PLAN 82 y.o. year old male  has a past medical history of Arthritis, Arthritis, Chronic diastolic heart failure, Chronic lower back pain, Coronary heart disease, Diverticulitis (12/2021), DVT (deep venous thrombosis) (05/2022), H/O cardiovascular stress test, Heart murmur, History of gout, Hyperlipidemia, Hypertension, Narcolepsy, OSA on CPAP, PONV (postoperative nausea and vomiting), Presence of permanent cardiac pacemaker, Shortness of breath, Stroke, and Type II diabetes mellitus. here with:  1.  Parkinson's disease  -Continue Sinemet IR 25-100 mg 1 and half tablets 3 times a day spaced out by 5 hours.  If this is not helpful we can add a fourth dose before bedtime -Continue Sinemet CR at  bedtime -Encouraged the patient to stay well-hydrated with water -Follow-up in 6 7 months or sooner if needed     Butch Penny, MSN, NP-C 02/15/2023, 3:19 PM [facility] [address] [place], [place] [address] [phone]

## 2023-02-17 ENCOUNTER — Ambulatory Visit: Payer: Medicare (Managed Care) | Admitting: Speech Pathology

## 2023-02-17 ENCOUNTER — Ambulatory Visit: Payer: Medicare (Managed Care) | Admitting: Physical Therapy

## 2023-02-17 VITALS — BP 112/63 | HR 73

## 2023-02-17 DIAGNOSIS — R1312 Dysphagia, oropharyngeal phase: Secondary | ICD-10-CM

## 2023-02-17 DIAGNOSIS — R293 Abnormal posture: Secondary | ICD-10-CM

## 2023-02-17 DIAGNOSIS — R471 Dysarthria and anarthria: Secondary | ICD-10-CM

## 2023-02-17 DIAGNOSIS — R2689 Other abnormalities of gait and mobility: Secondary | ICD-10-CM

## 2023-02-17 DIAGNOSIS — R2681 Unsteadiness on feet: Secondary | ICD-10-CM | POA: Diagnosis not present

## 2023-02-17 DIAGNOSIS — R41841 Cognitive communication deficit: Secondary | ICD-10-CM

## 2023-02-17 NOTE — Therapy (Signed)
OUTPATIENT PHYSICAL THERAPY NEURO TREATMENT   Patient Name: Jordan [name] MRN: [id number] DOB:1941-01-06, 82 y.o., male Today's Date: 02/17/2023   PCP: Johny Blamer, MD  REFERRING PROVIDER:  Horton Chin, MD  END OF SESSION:  PT End of Session - 02/17/23 1403     Visit Number 3    Number of Visits 9    Date for PT Re-Evaluation 04/10/23   due to delay in scheduling   Authorization Type [employer]    PT Start Time 1400    PT Stop Time 1446    PT Time Calculation (min) 46 min    Equipment Utilized During Treatment Gait belt    Activity Tolerance Patient tolerated treatment well    Behavior During Therapy [facility] for tasks assessed/performed;Impulsive             Past Medical History:  Diagnosis Date   Arthritis    R shoulder, bone spur   Arthritis    "hands" (03/25/2016)   Chronic diastolic heart failure    Chronic lower back pain    Coronary heart disease    Dr Garnette Scheuermann   Diverticulitis 12/2021   DVT (deep venous thrombosis) 05/2022   left leg   H/O cardiovascular stress test    perhaps last one was 2009   Heart murmur    12/29/11 echo: mild MR, no AS, trivial TR   History of gout    Hyperlipidemia    Hypertension    saw Dr. Mendel Ryder last earl;y- 2014, cardiac cath. last done ?2009, blocks seen didn't require any intervention at that point.   Narcolepsy    [name] 04-11-97; Mean Latency 1.34min, SOREM 2   OSA on CPAP    NPSG 12-13-98 AHI 22.7   PONV (postoperative nausea and vomiting)    Presence of permanent cardiac pacemaker    Shortness of breath    with exertion    Stroke    Type II diabetes mellitus    Past Surgical History:  Procedure Laterality Date   CARDIAC CATHETERIZATION  2009?   EP IMPLANTABLE DEVICE N/A 03/25/2016   Procedure: Pacemaker Implant - Dual Chamber;  Surgeon: Marinus Maw, MD;  Location: [facility] INVASIVE CV LAB;  Service: Cardiovascular;  Laterality: N/A;   FRACTURE SURGERY     HIP FRACTURE SURGERY     INSERT / REPLACE /  REMOVE PACEMAKER  03/24/2016   INTRAMEDULLARY (IM) NAIL INTERTROCHANTERIC Left 02/08/2022   Procedure: INTRAMEDULLARY (IM) NAIL INTERTROCHANTRIC;  Surgeon: Cammy Copa, MD;  Location: [facility] OR;  Service: Orthopedics;  Laterality: Left;   IR KYPHO LUMBAR INC FX REDUCE BONE BX UNI/BIL CANNULATION INC/IMAGING  12/29/2021   IR RADIOLOGIST EVAL & MGMT  12/15/2021   IR RADIOLOGY PERIPHERAL GUIDED IV START  07/14/2022   IR US GUIDE VASC ACCESS RIGHT  07/15/2022   LEFT AND RIGHT HEART CATHETERIZATION WITH CORONARY ANGIOGRAM N/A 06/05/2014   Procedure: LEFT AND RIGHT HEART CATHETERIZATION WITH CORONARY ANGIOGRAM;  Surgeon: Lesleigh Noe, MD;  Location: [facility] CATH LAB;  Service: Cardiovascular;  Laterality: N/A;   NASAL FRACTURE SURGERY     SHOULDER ARTHROSCOPY WITH ROTATOR CUFF REPAIR AND SUBACROMIAL DECOMPRESSION Right 08/16/2013   Procedure: SHOULDER ARTHROSCOPY WITH ROTATOR CUFF REPAIR AND SUBACROMIAL DECOMPRESSION;  Surgeon: Mable Paris, MD;  Location: [facility] OR;  Service: Orthopedics;  Laterality: Right;  Right shoulder arthroscopy rotator cuff repair, subacromial decompression.   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 07/23/2022   Acute deep vein thrombosis (DVT) of left lower extremity 04/29/2022   Closed intertrochanteric fracture of left hip, initial encounter    Closed left hip fracture (HCC) secondary to fall at home 02/08/2022   Paroxysmal atrial fibrillation 02/08/2022   Debility 01/08/2022   Constipation 01/07/2022   Physical deconditioning 01/07/2022   Acute metabolic encephalopathy 01/04/2022   Hypomagnesemia 01/04/2022   Pancreatitis 12/30/2021   Parkinson's disease 12/21/2021   Parkinson disease 12/18/2021   Frequent falls 12/18/2021   History of CVA (cerebrovascular accident) 12/18/2021   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   Hallucinations, visual 07/30/2021   Acetabulum  fracture, left 06/25/2021   Acute hip pain, left 06/24/2021   Fall at home, initial encounter 06/24/2021   Leukocytosis 06/24/2021   Chronic diastolic heart failure    Sinusitis, acute maxillary 06/24/2020   AKI (acute kidney injury)    Labile blood glucose    Cough    Benign essential HTN    Skin lesion of hand    Hypoalbuminemia due to protein-calorie malnutrition    Controlled type 2 diabetes mellitus with hyperglycemia, without long-term current use of insulin    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   Morbid obesity    Acute blood loss anemia    Acute thalamic infarction 04/15/2020   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   TIA (transient ischemic attack) 02/17/2018   Type 2 diabetes mellitus with hyperlipidemia 02/17/2018   Insomnia 02/01/2018   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   Chronotropic incompetence 07/09/2014   Dyspnea on exertion 03/03/2014   HLD (hyperlipidemia) 12/13/2007   Essential hypertension 12/13/2007   Class 1 obesity 12/05/2007   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   Coronary atherosclerosis 12/05/2007    ONSET DATE: 01/14/2023  REFERRING DIAG: G20.A1 (ICD-10-CM) - Parkinson's disease, unspecified whether dyskinesia present, unspecified whether manifestations fluctuate   THERAPY DIAG:  Other abnormalities of gait and mobility  Unsteadiness on feet  Abnormal posture  Rationale for Evaluation and Treatment: Rehabilitation  SUBJECTIVE:                                                                                                                                                                                             SUBJECTIVE STATEMENT: Pt denies acute changes. Has not completed his HEP, but is doing "other things" (ankle pumps, circles and toe scrunches). Worried about getting out of his condo if there were to be a Air cabin crew.   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 No Commonly has pain in neck and low back  Vitals:   02/17/23 1412  BP: 112/63  Pulse: 73     PRECAUTIONS: Fall  WEIGHT BEARING RESTRICTIONS: No  FALLS: Has patient fallen in last 6 months? No  LIVING ENVIRONMENT: Lives with: lives with their spouse Lives in: Other Condo Stairs:  On the 10th floor, there are 2 elevators  Has following equipment at home: Dan Humphreys - 2 wheeled, shower chair, Grab bars, and transport chair  PLOF: Independent with household mobility with device and Needs assistance with ADLs Wife helps with ADLs, pt can get dressed and bathe himself it just takes longer. "Socks take forever"   PATIENT GOALS: Pt wants to get back to driving. Wants to improve strength and endurance for walking.   OBJECTIVE:    COGNITION: Overall cognitive status: Impaired   SENSATION: WFL  COORDINATION: Heel to shin; Bradykinetic with LLE   POSTURE: rounded shoulders, forward head, increased thoracic kyphosis, posterior pelvic tilt, and flexed trunk   LOWER EXTREMITY ROM:     Decr AROM with LLE due to weakness, especially with knee extension.  LOWER EXTREMITY MMT:    MMT Right Eval Left Eval  Hip flexion 4 3+  Hip extension    Hip abduction 4 3+  Hip adduction 5 4+  Hip internal rotation    Hip external rotation    Knee flexion 5 4  Knee extension 4+ 3-  Ankle dorsiflexion 4+ 4  Ankle plantarflexion    Ankle inversion    Ankle eversion    (Blank rows = not tested)  All tested in sitting.   VITALS  Vitals:   02/17/23 1412  BP: 112/63  Pulse: 73      TODAY'S TREATMENT:         Ther Act  Gait pattern: step to pattern, decreased stride length,  decreased hip/knee flexion- Right, decreased ankle dorsiflexion- Right, shuffling, scissoring, trunk flexed, narrow BOS, and poor foot clearance- Right Distance walked: 64' Assistive device utilized: Environmental consultant - 2 wheeled Level of assistance: Mod A Comments: Pt maintaining significant neck flexion and hitting obstacles on R side despite mod cues to look forward and avoid said obstacles. Pt frequently pushing walker too far ahead and stepping outside of walker, requiring mod A to prevent fall. Pt had single instance of anterior LOB due to tripping over stool requiring max A to prevent fall. Pt very impulsive w/gait and not responsive to cues.   Gait pattern: step to pattern, decreased stride length, decreased hip/knee flexion- Right, decreased ankle dorsiflexion- Right, shuffling, trunk flexed, and poor foot clearance- Right Distance walked: 66' + 63' Assistive device utilized: Environmental consultant - 2 wheeled Level of assistance: Min A Comments: Provided max visual cues regarding maintaining close distance to RW, scanning environment and reducing back pain w/good posture. Pt able to maintain upright posture w/adequate visual scanning for ~15 feet prior to regressing to significant forward flexed posture and downward gaze. Pt requested to sit due to low back pain and began to inquire about how he can get up and walk without use of an AD as he is often "stranded" in bed or a recliner and cannot reach his walker and he will take "a few strategic steps" to reach the walker.  Informed pt it is not safe for him to walk without AD or supervision and if it is necessary, to use a reacher to reach for RW. Pt verbalized understanding.     PATIENT EDUCATION: Education details: Continue HEP, see emphasis on safety with gait above Person educated: Patient and Spouse  Education method: Explanation, Demonstration, Verbal cues, and Handouts Education comprehension: verbalized understanding, returned demonstration, and needs further  education  HOME EXERCISE PROGRAM:  And walking program with RW at home   Access Code: [email or web address] URL: [email or web address] Date: 02/15/2023 Prepared by: Sherlie Ban  Exercises - Seated Scapular Retraction  - 1 x daily - 7 x weekly - 2 sets - 10 reps - Seated Long Arc Quad  - 1 x daily - 7 x weekly - 2 sets - 10 reps - Seated March  - 1 x daily - 7 x weekly - 2 sets - 10 reps - Sit to Stand with Armchair  - 2 x daily - 7 x weekly - 2 sets - 5 reps - Supine Bridge  - 1 x daily - 7 x weekly - 1-2 sets - 10 reps  GOALS: Goals reviewed with patient? Yes  SHORT TERM GOALS: ALL STGS = LTGS   LONG TERM GOALS: Target date: 03/09/2023         Pt will be independent with final HEP with wife supervision/assist in order to build upon functional gains made in therapy. Baseline:  Goal status: INITIAL  2.  Pt will improve 5x sit<>stand to less than or equal to 25 sec with UE support to demonstrate improved functional strength and transfer efficiency.  Baseline: 32.79 seconds with BUE support, performs from transport chair  Goal status: INITIAL  3.  Pt will improve gait speed with RW to at least 1.3 ft/sec in order to demo improved household mobility. Baseline: 31.15 seconds with RW= 1.05 ft/sec  Goal status: INITIAL  4.  Pt will ambulate greater than or equal to 100 feet on with RW and min A for improved cardiovascular endurance and BLE strength.    Baseline: 67' w/RW and mod A  Goal status: REVISED    ASSESSMENT:  CLINICAL IMPRESSION: Emphasis of skilled PT session on gait assessment and education on safety w/ambulation. Pt ambulated 102' on w/RW and mod A, which is significantly below age-adjusted norms. Pt very impulsive during walk test and did not scan his environment, resulting in hitting obstacles on R side. Pt reporting high levels of low back pain w/short ambulation distances due to significant forward flexed posture and required max encouragement to  walk >30'. Educated pt that he is not safe to ambulate without assistance, as pt states he frequently has to take a few steps unassisted to get to RW at home. Continue POC.    OBJECTIVE IMPAIRMENTS: Abnormal gait, cardiopulmonary status limiting activity, decreased activity tolerance, decreased balance, decreased cognition, decreased coordination, decreased endurance, decreased mobility, difficulty walking, decreased ROM, decreased strength, dizziness, impaired flexibility, impaired UE functional use, and postural dysfunction.   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   REHAB POTENTIAL: Fair due to chronicity of condition, decr carryover at home.   CLINICAL DECISION MAKING: Evolving/moderate complexity  EVALUATION COMPLEXITY: Moderate  PLAN:  PT FREQUENCY: 2x/week  PT DURATION: 8 weeks - due to potential delay in scheduling, only expect 4 weeks   PLANNED INTERVENTIONS: Therapeutic exercises, Therapeutic activity, Neuromuscular re-education, Balance training, Gait training, Patient/Family education, Self Care, Vestibular training, Canalith repositioning, DME instructions, Manual therapy, and Re-evaluation  PLAN FOR NEXT SESSION: Work on gait with RW for endurance, standing tasks/functional strength.    Jill Alexanders Chrystian Ressler, PT, DPT 02/17/2023, 3:59 PM

## 2023-02-17 NOTE — Therapy (Signed)
OUTPATIENT SPEECH LANGUAGE PATHOLOGY [facility]ALU[facility]   Patient Name: Jordan [name] MRN: [id number] DOB:July 31, 1941, 82 y.o., male Today's Date: 02/18/2023  PCP: Johny Blamer, MD REFERRING PROVIDER: Horton Chin, MD  END OF SESSION:  End of Session - 02/17/23 1329     Visit Number 2    Number of Visits 9    Date for SLP Re-Evaluation 03/23/23   to accomodate scheduling   Authorization Type [employer]    SLP Start Time 1329   pt arrived late   SLP Stop Time  1400    SLP Time Calculation (min) 31 min    Activity Tolerance Patient tolerated treatment well              Past Medical History:  Diagnosis Date   Arthritis    R shoulder, bone spur   Arthritis    "hands" (03/25/2016)   Chronic diastolic heart failure    Chronic lower back pain    Coronary heart disease    Dr Garnette Scheuermann   Diverticulitis 12/2021   DVT (deep venous thrombosis) 05/2022   left leg   H/O cardiovascular stress test    perhaps last one was 2009   Heart murmur    12/29/11 echo: mild MR, no AS, trivial TR   History of gout    Hyperlipidemia    Hypertension    saw Dr. Mendel Ryder last earl;y- 2014, cardiac cath. last done ?2009, blocks seen didn't require any intervention at that point.   Narcolepsy    [name] 04-11-97; Mean Latency 1.30min, SOREM 2   OSA on CPAP    NPSG 12-13-98 AHI 22.7   PONV (postoperative nausea and vomiting)    Presence of permanent cardiac pacemaker    Shortness of breath    with exertion    Stroke    Type II diabetes mellitus    Past Surgical History:  Procedure Laterality Date   CARDIAC CATHETERIZATION  2009?   EP IMPLANTABLE DEVICE N/A 03/25/2016   Procedure: Pacemaker Implant - Dual Chamber;  Surgeon: Marinus Maw, MD;  Location: [facility] INVASIVE CV LAB;  Service: Cardiovascular;  Laterality: N/A;   FRACTURE SURGERY     HIP FRACTURE SURGERY     INSERT / REPLACE / REMOVE PACEMAKER  03/24/2016   INTRAMEDULLARY (IM) NAIL INTERTROCHANTERIC Left 02/08/2022    Procedure: INTRAMEDULLARY (IM) NAIL INTERTROCHANTRIC;  Surgeon: Cammy Copa, MD;  Location: [facility] OR;  Service: Orthopedics;  Laterality: Left;   IR KYPHO LUMBAR INC FX REDUCE BONE BX UNI/BIL CANNULATION INC/IMAGING  12/29/2021   IR RADIOLOGIST EVAL & MGMT  12/15/2021   IR RADIOLOGY PERIPHERAL GUIDED IV START  07/14/2022   IR US GUIDE VASC ACCESS RIGHT  07/15/2022   LEFT AND RIGHT HEART CATHETERIZATION WITH CORONARY ANGIOGRAM N/A 06/05/2014   Procedure: LEFT AND RIGHT HEART CATHETERIZATION WITH CORONARY ANGIOGRAM;  Surgeon: Lesleigh Noe, MD;  Location: [facility] CATH LAB;  Service: Cardiovascular;  Laterality: N/A;   NASAL FRACTURE SURGERY     SHOULDER ARTHROSCOPY WITH ROTATOR CUFF REPAIR AND SUBACROMIAL DECOMPRESSION Right 08/16/2013   Procedure: SHOULDER ARTHROSCOPY WITH ROTATOR CUFF REPAIR AND SUBACROMIAL DECOMPRESSION;  Surgeon: Mable Paris, MD;  Location: [facility] OR;  Service: Orthopedics;  Laterality: Right;  Right shoulder arthroscopy rotator cuff repair, subacromial decompression.   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   Acute deep vein thrombosis (DVT) of left lower  extremity 04/29/2022   Closed intertrochanteric fracture of left hip, initial encounter    Closed left hip fracture (HCC) secondary to fall at home 02/08/2022   Paroxysmal atrial fibrillation 02/08/2022   Debility 01/08/2022   Constipation 01/07/2022   Physical deconditioning 01/07/2022   Acute metabolic encephalopathy 01/04/2022   Hypomagnesemia 01/04/2022   Pancreatitis 12/30/2021   Parkinson's disease 12/21/2021   Parkinson disease 12/18/2021   Frequent falls 12/18/2021   History of CVA (cerebrovascular accident) 12/18/2021   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   Hallucinations, visual 07/30/2021   Acetabulum fracture, left 06/25/2021   Acute hip pain, left 06/24/2021   Fall at home, initial encounter  06/24/2021   Leukocytosis 06/24/2021   Chronic diastolic heart failure    Sinusitis, acute maxillary 06/24/2020   AKI (acute kidney injury)    Labile blood glucose    Cough    Benign essential HTN    Skin lesion of hand    Hypoalbuminemia due to protein-calorie malnutrition    Controlled type 2 diabetes mellitus with hyperglycemia, without long-term current use of insulin    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   Morbid obesity    Acute blood loss anemia    Acute thalamic infarction 04/15/2020   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   TIA (transient ischemic attack) 02/17/2018   Type 2 diabetes mellitus with hyperlipidemia 02/17/2018   Insomnia 02/01/2018   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   Chronotropic incompetence 07/09/2014   Dyspnea on exertion 03/03/2014   HLD (hyperlipidemia) 12/13/2007   Essential hypertension 12/13/2007   Class 1 obesity 12/05/2007   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   Coronary atherosclerosis 12/05/2007    ONSET DATE: Referral 01-14-23  REFERRING DIAG: G20.A1 (ICD-10-CM) - Parkinson's disease, unspecified whether dyskinesia present, unspecified whether manifestations fluctuate   THERAPY DIAG:  Dysphagia, oropharyngeal phase  Dysarthria and anarthria  Cognitive communication deficit  Rationale for Evaluation and Treatment: Rehabilitation  SUBJECTIVE:   SUBJECTIVE STATEMENT: "I've been up and down" Pt accompanied by: significant other (wife: [name])  PERTINENT HISTORY: Hx of prior stroke and parkinsonism. At times, will have difficulty holding head up and sits in a hunched over position. He will have difficulty holding on to small, especially lighter objects, right > left since stroke. C/o shortness of breath after talking for prolonged periods of time which has been present over the past 4-5 months. Denies SOB on exertion but wife believes that he does. Wife also notes voice being softer. Denies any  swallowing difficulties. He also c/o continued fatigue.   PAIN:  Are you having pain? No  FALLS: Has patient fallen in last 6 months?  See PT evaluation for details  LIVING ENVIRONMENT: Lives with: lives with their spouse Lives in: House/apartment  PLOF:  Level of assistance: Needed assistance with ADLs, Needed assistance with IADLS Employment: Retired  PATIENT GOALS: not have people ask to repeat himself so often   OBJECTIVE:   TODAY'S TREATMENT:    02-17-23: Pt entered room averaging conversational speech at 66 dB. Pt completed Speak Out! lesson 14. SLP provided initial model for each warm up and exercise.     Loud "AH" - 85 dB  Pitch Glide - 84 dB   Counting - 72 dB   Reading - 74 dB  Cognitive -  74 dB    Pt required occasional min-A (gestural and verbal) to increase his volume. Pt demonstrated intermittent awareness of low volume, although required cueing to correct.   02-09-23: Eval completed this date.  Initiated education on speaking with intent and dysarthria strategies. Plan to practice reading other people's cues for pt to increase volume when speaking in the community and also in noisy environment. Pt expressed concern for his hearing aids making everyone sound as if they were speaking too loudly. SLP discussed option of a microphone that could connect through bluetooth to his hearing aids to focus on nearby conversation instead of picking up all environmental noise.   PATIENT EDUCATION: Education details: See above Person educated: Patient and Spouse Education method: Explanation Education comprehension: verbalized understanding and needs further education  HOME EXERCISE PROGRAM: Speak Out! lessons   GOALS: Goals reviewed with patient? Yes  SHORT TERM GOALS: Target date: 03/09/2023    Pt will average 70 dB or greater during 2+ paragraph reading with mod-A Baseline: Goal status: IN PROGRESS  2.  Pt will ID volume decay in structured tasks given visual cues  in 70% of opportunities over 2 sessions Baseline:  Goal status: IN PROGRESS  3.  Pt will implement external aid to optimize vocal intensity (averaging 68 dB +) during 10 minute conversation Baseline:  Goal status: IN PROGRESS   LONG TERM GOALS: Target date: 04/06/2023  Pt will improve volume PRN in 80% of opportunities given visual cue only over 20 minute conversation in environment with background noise  Baseline:  Goal status: IN PROGRESS  2.  Pt and spouse will report carryover of strategies and compensations resulting in subjective perception of improved communication efficacy by d/c Baseline:  Goal status: IN PROGRESS   ASSESSMENT:  CLINICAL IMPRESSION: Pt completed Speak Out! Lesson this date to target hypokinetic dysarthria. Re-instructed pt in dysarthria strategies and compensations. Pr required occasional min-A to increase his volume. Continue to recommend skilled ST to optimize communication efficacy and prevent further decline of pt's speaking efficacy.    OBJECTIVE IMPAIRMENTS: Objective impairments include dysarthria. These impairments are limiting patient from effectively communicating at home and in community.Factors affecting potential to achieve goals and functional outcome are medical prognosis.. Patient will benefit from skilled SLP services to address above impairments and improve overall function.  REHAB POTENTIAL: Good  PLAN:  SLP FREQUENCY: 2x/week  SLP DURATION: 8 weeks  PLANNED INTERVENTIONS: SLP instruction and feedback, Compensatory strategies, Patient/family education, and dysarthria strategies and compensations, Speak Out! lessons   Cephus Shelling, Student-SLP 02/08/2023, 3:05 PM

## 2023-02-22 ENCOUNTER — Ambulatory Visit: Payer: Medicare (Managed Care) | Admitting: Physical Therapy

## 2023-02-22 ENCOUNTER — Ambulatory Visit: Payer: Medicare (Managed Care) | Admitting: Speech Pathology

## 2023-02-22 ENCOUNTER — Ambulatory Visit: Payer: Medicare (Managed Care) | Admitting: Occupational Therapy

## 2023-02-22 DIAGNOSIS — R2681 Unsteadiness on feet: Secondary | ICD-10-CM | POA: Diagnosis not present

## 2023-02-22 DIAGNOSIS — R41841 Cognitive communication deficit: Secondary | ICD-10-CM

## 2023-02-22 DIAGNOSIS — R471 Dysarthria and anarthria: Secondary | ICD-10-CM

## 2023-02-22 NOTE — Therapy (Signed)
OUTPATIENT SPEECH LANGUAGE PATHOLOGY [facility]ALUATION   Patient Name: Jordan [name] MRN: [id number] DOB:1940/12/24, 82 y.o., male Today's Date: 02/22/2023  PCP: Johny Blamer, MD REFERRING PROVIDER: Horton Chin, MD  END OF SESSION:  End of Session - 02/22/23 1406     Visit Number 3    Number of Visits 9    Date for SLP Re-Evaluation 03/23/23   to accomodate scheduling   Authorization Type [employer]    SLP Start Time 1406   pt arrived late   SLP Stop Time  1445    SLP Time Calculation (min) 39 min    Activity Tolerance Patient tolerated treatment well               Past Medical History:  Diagnosis Date   Arthritis    R shoulder, bone spur   Arthritis    "hands" (03/25/2016)   Chronic diastolic heart failure    Chronic lower back pain    Coronary heart disease    Dr Garnette Scheuermann   Diverticulitis 12/2021   DVT (deep venous thrombosis) 05/2022   left leg   H/O cardiovascular stress test    perhaps last one was 2009   Heart murmur    12/29/11 echo: mild MR, no AS, trivial TR   History of gout    Hyperlipidemia    Hypertension    saw Dr. Mendel Ryder last earl;y- 2014, cardiac cath. last done ?2009, blocks seen didn't require any intervention at that point.   Narcolepsy    [name] 04-11-97; Mean Latency 1.60min, SOREM 2   OSA on CPAP    NPSG 12-13-98 AHI 22.7   PONV (postoperative nausea and vomiting)    Presence of permanent cardiac pacemaker    Shortness of breath    with exertion    Stroke    Type II diabetes mellitus    Past Surgical History:  Procedure Laterality Date   CARDIAC CATHETERIZATION  2009?   EP IMPLANTABLE DEVICE N/A 03/25/2016   Procedure: Pacemaker Implant - Dual Chamber;  Surgeon: Marinus Maw, MD;  Location: [facility] INVASIVE CV LAB;  Service: Cardiovascular;  Laterality: N/A;   FRACTURE SURGERY     HIP FRACTURE SURGERY     INSERT / REPLACE / REMOVE PACEMAKER  03/24/2016   INTRAMEDULLARY (IM) NAIL INTERTROCHANTERIC Left  02/08/2022   Procedure: INTRAMEDULLARY (IM) NAIL INTERTROCHANTRIC;  Surgeon: Cammy Copa, MD;  Location: [facility] OR;  Service: Orthopedics;  Laterality: Left;   IR KYPHO LUMBAR INC FX REDUCE BONE BX UNI/BIL CANNULATION INC/IMAGING  12/29/2021   IR RADIOLOGIST EVAL & MGMT  12/15/2021   IR RADIOLOGY PERIPHERAL GUIDED IV START  07/14/2022   IR US GUIDE VASC ACCESS RIGHT  07/15/2022   LEFT AND RIGHT HEART CATHETERIZATION WITH CORONARY ANGIOGRAM N/A 06/05/2014   Procedure: LEFT AND RIGHT HEART CATHETERIZATION WITH CORONARY ANGIOGRAM;  Surgeon: Lesleigh Noe, MD;  Location: [facility] CATH LAB;  Service: Cardiovascular;  Laterality: N/A;   NASAL FRACTURE SURGERY     SHOULDER ARTHROSCOPY WITH ROTATOR CUFF REPAIR AND SUBACROMIAL DECOMPRESSION Right 08/16/2013   Procedure: SHOULDER ARTHROSCOPY WITH ROTATOR CUFF REPAIR AND SUBACROMIAL DECOMPRESSION;  Surgeon: Mable Paris, MD;  Location: [facility] OR;  Service: Orthopedics;  Laterality: Right;  Right shoulder arthroscopy rotator cuff repair, subacromial decompression.   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   Acute deep vein thrombosis (DVT) of left  lower extremity 04/29/2022   Closed intertrochanteric fracture of left hip, initial encounter    Closed left hip fracture (HCC) secondary to fall at home 02/08/2022   Paroxysmal atrial fibrillation 02/08/2022   Debility 01/08/2022   Constipation 01/07/2022   Physical deconditioning 01/07/2022   Acute metabolic encephalopathy 01/04/2022   Hypomagnesemia 01/04/2022   Pancreatitis 12/30/2021   Parkinson's disease 12/21/2021   Parkinson disease 12/18/2021   Frequent falls 12/18/2021   History of CVA (cerebrovascular accident) 12/18/2021   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   Hallucinations, visual 07/30/2021   Acetabulum fracture, left 06/25/2021   Acute hip pain, left 06/24/2021   Fall at home, initial  encounter 06/24/2021   Leukocytosis 06/24/2021   Chronic diastolic heart failure    Sinusitis, acute maxillary 06/24/2020   AKI (acute kidney injury)    Labile blood glucose    Cough    Benign essential HTN    Skin lesion of hand    Hypoalbuminemia due to protein-calorie malnutrition    Controlled type 2 diabetes mellitus with hyperglycemia, without long-term current use of insulin    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   Morbid obesity    Acute blood loss anemia    Acute thalamic infarction 04/15/2020   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   TIA (transient ischemic attack) 02/17/2018   Type 2 diabetes mellitus with hyperlipidemia 02/17/2018   Insomnia 02/01/2018   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   Chronotropic incompetence 07/09/2014   Dyspnea on exertion 03/03/2014   HLD (hyperlipidemia) 12/13/2007   Essential hypertension 12/13/2007   Class 1 obesity 12/05/2007   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 Every other day.   PERTINENT HISTORY: Hx of prior stroke and parkinsonism. At times, will have difficulty holding head up and sits in a hunched over position. He will have difficulty holding on to small, especially lighter objects, right > left since stroke. C/o shortness of breath after talking for prolonged periods of time which has been present over the past 4-5 months. Denies SOB on exertion but wife believes that he does. Wife also notes voice being softer. Denies any swallowing difficulties. He also c/o  continued fatigue.   PAIN:  Are you having pain? No  FALLS: Has patient fallen in last 6 months?  See PT evaluation for details  LIVING ENVIRONMENT: Lives with: lives with their spouse Lives in: House/apartment  PLOF:  Level of assistance: Needed assistance with ADLs, Needed assistance with IADLS Employment: Retired  PATIENT GOALS: not have people ask to repeat himself so often   OBJECTIVE:   TODAY'S TREATMENT:   02/22/23: Pt reporting challenges with cell phone usage, specifically in texting accurately. SLP instructs on use of talk to text to support multimodal communication with cell phone. Pt able to demonstrate use in sending x5 messages with usual verbal cues. Will likely need additional training. Education on practice making strategy more facilitative. Spouse able to teach back and will be able to assist pt in home practice. Visual aid provided. Plan to review talk to text and target carryover of increased volume with presence of distractions in next session.   02-17-23: Pt entered room averaging conversational  speech at 66 dB. Pt completed Speak Out! lesson 14. SLP provided initial model for each warm up and exercise.     Loud "AH" - 85 dB  Pitch Glide - 84 dB   Counting - 72 dB   Reading - 74 dB  Cognitive -  74 dB    Pt required occasional min-A (gestural and verbal) to increase his volume. Pt demonstrated intermittent awareness of low volume, although required cueing to correct.   02-09-23: Eval completed this date. Initiated education on speaking with intent and dysarthria strategies. Plan to practice reading other people's cues for pt to increase volume when speaking in the community and also in noisy environment. Pt expressed concern for his hearing aids making everyone sound as if they were speaking too loudly. SLP discussed option of a microphone that could connect through bluetooth to his hearing aids to focus on nearby conversation instead of picking up all  environmental noise.   PATIENT EDUCATION: Education details: See above Person educated: Patient and Spouse Education method: Explanation Education comprehension: verbalized understanding and needs further education  HOME EXERCISE PROGRAM: Speak Out! lessons   GOALS: Goals reviewed with patient? Yes  SHORT TERM GOALS: Target date: 03/09/2023    Pt will average 70 dB or greater during 2+ paragraph reading with mod-A Baseline: Goal status: IN PROGRESS  2.  Pt will ID volume decay in structured tasks given visual cues in 70% of opportunities over 2 sessions Baseline:  Goal status: IN PROGRESS  3.  Pt will implement external aid to optimize vocal intensity (averaging 68 dB +) during 10 minute conversation Baseline:  Goal status: IN PROGRESS   LONG TERM GOALS: Target date: 04/06/2023  Pt will improve volume PRN in 80% of opportunities given visual cue only over 20 minute conversation in environment with background noise  Baseline:  Goal status: IN PROGRESS  2.  Pt and spouse will report carryover of strategies and compensations resulting in subjective perception of improved communication efficacy by d/c Baseline:  Goal status: IN PROGRESS   ASSESSMENT:  CLINICAL IMPRESSION: Pt completed Speak Out! Lesson this date to target hypokinetic dysarthria. Re-instructed pt in dysarthria strategies and compensations. Pr required occasional min-A to increase his volume. Continue to recommend skilled ST to optimize communication efficacy and prevent further decline of pt's speaking efficacy.    OBJECTIVE IMPAIRMENTS: Objective impairments include dysarthria. These impairments are limiting patient from effectively communicating at home and in community.Factors affecting potential to achieve goals and functional outcome are medical prognosis.. Patient will benefit from skilled SLP services to address above impairments and improve overall function.  REHAB POTENTIAL: Good  PLAN:  SLP  FREQUENCY: 2x/week  SLP DURATION: 8 weeks  PLANNED INTERVENTIONS: SLP instruction and feedback, Compensatory strategies, Patient/family education, and dysarthria strategies and compensations, Speak Out! lessons   Cephus Shelling, Student-SLP 02/08/2023, 3:05 PM

## 2023-02-23 DIAGNOSIS — M9904 Segmental and somatic dysfunction of sacral region: Secondary | ICD-10-CM | POA: Diagnosis not present

## 2023-02-23 DIAGNOSIS — M9905 Segmental and somatic dysfunction of pelvic region: Secondary | ICD-10-CM | POA: Diagnosis not present

## 2023-02-23 DIAGNOSIS — M9903 Segmental and somatic dysfunction of lumbar region: Secondary | ICD-10-CM | POA: Diagnosis not present

## 2023-02-23 DIAGNOSIS — M5136 Other intervertebral disc degeneration, lumbar region: Secondary | ICD-10-CM | POA: Diagnosis not present

## 2023-02-24 ENCOUNTER — Ambulatory Visit: Payer: Medicare (Managed Care) | Admitting: Speech Pathology

## 2023-02-24 ENCOUNTER — Ambulatory Visit: Payer: Medicare (Managed Care) | Admitting: Physical Therapy

## 2023-02-24 VITALS — BP 113/63 | HR 68

## 2023-02-24 DIAGNOSIS — M6281 Muscle weakness (generalized): Secondary | ICD-10-CM

## 2023-02-24 DIAGNOSIS — R471 Dysarthria and anarthria: Secondary | ICD-10-CM

## 2023-02-24 DIAGNOSIS — R2689 Other abnormalities of gait and mobility: Secondary | ICD-10-CM

## 2023-02-24 DIAGNOSIS — R278 Other lack of coordination: Secondary | ICD-10-CM

## 2023-02-24 DIAGNOSIS — R2681 Unsteadiness on feet: Secondary | ICD-10-CM | POA: Diagnosis not present

## 2023-02-24 DIAGNOSIS — R1312 Dysphagia, oropharyngeal phase: Secondary | ICD-10-CM

## 2023-02-24 DIAGNOSIS — R293 Abnormal posture: Secondary | ICD-10-CM

## 2023-02-24 DIAGNOSIS — R41841 Cognitive communication deficit: Secondary | ICD-10-CM

## 2023-02-24 NOTE — Therapy (Signed)
OUTPATIENT SPEECH LANGUAGE PATHOLOGY PARKINSON'S EVALUATION   Patient Name: Jordan [name] MRN: [id number] DOB:[birth year], 82 y.o., male Today's Date: 02/24/2023  PCP: Johny Blamer, MD REFERRING PROVIDER: Horton Chin, MD  END OF SESSION:  End of Session - 02/24/23 1238     Visit Number 4    Number of Visits 9    Date for SLP Re-Evaluation 03/23/23   to accomodate scheduling   Authorization Type [employer]    SLP Start Time 1236    SLP Stop Time  1315    SLP Time Calculation (min) 39 min    Activity Tolerance Patient tolerated treatment well               Past Medical History:  Diagnosis Date   Arthritis    R shoulder, bone spur   Arthritis    "hands" (03/25/2016)   Chronic diastolic heart failure    Chronic lower back pain    Coronary heart disease    Dr Garnette Scheuermann   Diverticulitis 12/2021   DVT (deep venous thrombosis) 05/2022   left leg   H/O cardiovascular stress test    perhaps last one was 2009   Heart murmur    12/29/11 echo: mild MR, no AS, trivial TR   History of gout    Hyperlipidemia    Hypertension    saw Dr. Mendel Ryder last earl;y- 2014, cardiac cath. last done ?2009, blocks seen didn't require any intervention at that point.   Narcolepsy    [name] 04-11-97; Mean Latency 1.62min, SOREM 2   OSA on CPAP    NPSG 12-13-98 AHI 22.7   PONV (postoperative nausea and vomiting)    Presence of permanent cardiac pacemaker    Shortness of breath    with exertion    Stroke    Type II diabetes mellitus    Past Surgical History:  Procedure Laterality Date   CARDIAC CATHETERIZATION  2009?   EP IMPLANTABLE DEVICE N/A 03/25/2016   Procedure: Pacemaker Implant - Dual Chamber;  Surgeon: Marinus Maw, MD;  Location: [facility] INVASIVE CV LAB;  Service: Cardiovascular;  Laterality: N/A;   FRACTURE SURGERY     HIP FRACTURE SURGERY     INSERT / REPLACE / REMOVE PACEMAKER  03/24/2016   INTRAMEDULLARY (IM) NAIL INTERTROCHANTERIC Left 02/08/2022   Procedure:  INTRAMEDULLARY (IM) NAIL INTERTROCHANTRIC;  Surgeon: Cammy Copa, MD;  Location: [facility] OR;  Service: Orthopedics;  Laterality: Left;   IR KYPHO LUMBAR INC FX REDUCE BONE BX UNI/BIL CANNULATION INC/IMAGING  12/29/2021   IR RADIOLOGIST EVAL & MGMT  12/15/2021   IR RADIOLOGY PERIPHERAL GUIDED IV START  07/14/2022   IR US GUIDE VASC ACCESS RIGHT  07/15/2022   LEFT AND RIGHT HEART CATHETERIZATION WITH CORONARY ANGIOGRAM N/A 06/05/2014   Procedure: LEFT AND RIGHT HEART CATHETERIZATION WITH CORONARY ANGIOGRAM;  Surgeon: Lesleigh Noe, MD;  Location: [facility] CATH LAB;  Service: Cardiovascular;  Laterality: N/A;   NASAL FRACTURE SURGERY     SHOULDER ARTHROSCOPY WITH ROTATOR CUFF REPAIR AND SUBACROMIAL DECOMPRESSION Right 08/16/2013   Procedure: SHOULDER ARTHROSCOPY WITH ROTATOR CUFF REPAIR AND SUBACROMIAL DECOMPRESSION;  Surgeon: Mable Paris, MD;  Location: [facility] OR;  Service: Orthopedics;  Laterality: Right;  Right shoulder arthroscopy rotator cuff repair, subacromial decompression.   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   Acute deep vein thrombosis (DVT) of left lower extremity 04/29/2022  Closed intertrochanteric fracture of left hip, initial encounter    Closed left hip fracture (HCC) secondary to fall at home 02/08/2022   Paroxysmal atrial fibrillation 02/08/2022   Debility 01/08/2022   Constipation 01/07/2022   Physical deconditioning 01/07/2022   Acute metabolic encephalopathy 01/04/2022   Hypomagnesemia 01/04/2022   Pancreatitis 12/30/2021   Parkinson's disease 12/21/2021   Parkinson disease 12/18/2021   Frequent falls 12/18/2021   History of CVA (cerebrovascular accident) 12/18/2021   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   Hallucinations, visual 07/30/2021   Acetabulum fracture, left 06/25/2021   Acute hip pain, left 06/24/2021   Fall at home, initial encounter 06/24/2021    Leukocytosis 06/24/2021   Chronic diastolic heart failure    Sinusitis, acute maxillary 06/24/2020   AKI (acute kidney injury)    Labile blood glucose    Cough    Benign essential HTN    Skin lesion of hand    Hypoalbuminemia due to protein-calorie malnutrition    Controlled type 2 diabetes mellitus with hyperglycemia, without long-term current use of insulin    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   Morbid obesity    Acute blood loss anemia    Acute thalamic infarction 04/15/2020   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   TIA (transient ischemic attack) 02/17/2018   Type 2 diabetes mellitus with hyperlipidemia 02/17/2018   Insomnia 02/01/2018   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   Chronotropic incompetence 07/09/2014   Dyspnea on exertion 03/03/2014   HLD (hyperlipidemia) 12/13/2007   Essential hypertension 12/13/2007   Class 1 obesity 12/05/2007   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   Coronary atherosclerosis 12/05/2007    ONSET DATE: Referral 01-14-23  REFERRING DIAG: G20.A1 (ICD-10-CM) - Parkinson's disease, unspecified whether dyskinesia present, unspecified whether manifestations fluctuate   THERAPY DIAG:  Dysarthria and anarthria  Cognitive communication deficit  Dysphagia, oropharyngeal phase  Rationale for Evaluation and Treatment: Rehabilitation  SUBJECTIVE:   SUBJECTIVE STATEMENT: Pt reporting "a little" re: HEP completion. Every other day.   PERTINENT HISTORY: Hx of prior stroke and parkinsonism. At times, will have difficulty holding head up and sits in a hunched over position. He will have difficulty holding on to small, especially lighter objects, right > left since stroke. C/o shortness of breath after talking for prolonged periods of time which has been present over the past 4-5 months. Denies SOB on exertion but wife believes that he does. Wife also notes voice being softer. Denies any swallowing difficulties. He also  c/o continued fatigue.   PAIN:  Are you having pain? No  FALLS: Has patient fallen in last 6 months?  See PT evaluation for details  LIVING ENVIRONMENT: Lives with: lives with their spouse Lives in: House/apartment  PLOF:  Level of assistance: Needed assistance with ADLs, Needed assistance with IADLS Employment: Retired  PATIENT GOALS: not have people ask to repeat himself so often   OBJECTIVE:   TODAY'S TREATMENT:   02/24/23: Pt questioning whether imaging completion of speech exercises will help with speech. SLP provides education to support active practice in SLP. Pt verbalizes agreement, if not full understanding of why he cannot just imagine practicing. Led pt through speak out lesson 8, abbreviated. Pt averages 85 dB during warm ups, 74 dB during reading. Incorporate noise interference to aid in communication in challenging environments, with pt successfully able to communicate thoughts and ideas across 5 minute conversation. Plan to target identifying visual cues next session to ID when volume drops.   02/22/23: Pt reporting challenges with  cell phone usage, specifically in texting accurately. SLP instructs on use of talk to text to support multimodal communication with cell phone. Pt able to demonstrate use in sending x5 messages with usual verbal cues. Will likely need additional training. Education on practice making strategy more facilitative. Spouse able to teach back and will be able to assist pt in home practice. Visual aid provided. Plan to review talk to text and target carryover of increased volume with presence of distractions in next session.   02-17-23: Pt entered room averaging conversational speech at 66 dB. Pt completed Speak Out! lesson 14. SLP provided initial model for each warm up and exercise.     Loud "AH" - 85 dB  Pitch Glide - 84 dB   Counting - 72 dB   Reading - 74 dB  Cognitive -  74 dB    Pt required occasional min-A (gestural and verbal) to increase  his volume. Pt demonstrated intermittent awareness of low volume, although required cueing to correct.   02-09-23: Eval completed this date. Initiated education on speaking with intent and dysarthria strategies. Plan to practice reading other people's cues for pt to increase volume when speaking in the community and also in noisy environment. Pt expressed concern for his hearing aids making everyone sound as if they were speaking too loudly. SLP discussed option of a microphone that could connect through bluetooth to his hearing aids to focus on nearby conversation instead of picking up all environmental noise.   PATIENT EDUCATION: Education details: See above Person educated: Patient and Spouse Education method: Explanation Education comprehension: verbalized understanding and needs further education  HOME EXERCISE PROGRAM: Speak Out! lessons   GOALS: Goals reviewed with patient? Yes  SHORT TERM GOALS: Target date: 03/09/2023    Pt will average 70 dB or greater during 2+ paragraph reading with mod-A Baseline: Goal status: IN PROGRESS  2.  Pt will ID volume decay in structured tasks given visual cues in 70% of opportunities over 2 sessions Baseline:  Goal status: IN PROGRESS  3.  Pt will implement external aid to optimize vocal intensity (averaging 68 dB +) during 10 minute conversation Baseline:  Goal status: IN PROGRESS   LONG TERM GOALS: Target date: 04/06/2023  Pt will improve volume PRN in 80% of opportunities given visual cue only over 20 minute conversation in environment with background noise  Baseline:  Goal status: IN PROGRESS  2.  Pt and spouse will report carryover of strategies and compensations resulting in subjective perception of improved communication efficacy by d/c Baseline:  Goal status: IN PROGRESS   ASSESSMENT:  CLINICAL IMPRESSION: Pt completed Speak Out! Lesson this date to target hypokinetic dysarthria. Re-instructed pt in dysarthria strategies and  compensations. Pr required occasional min-A to increase his volume. Continue to recommend skilled ST to optimize communication efficacy and prevent further decline of pt's speaking efficacy.    OBJECTIVE IMPAIRMENTS: Objective impairments include dysarthria. These impairments are limiting patient from effectively communicating at home and in community.Factors affecting potential to achieve goals and functional outcome are medical prognosis.. Patient will benefit from skilled SLP services to address above impairments and improve overall function.  REHAB POTENTIAL: Good  PLAN:  SLP FREQUENCY: 2x/week  SLP DURATION: 8 weeks  PLANNED INTERVENTIONS: SLP instruction and feedback, Compensatory strategies, Patient/family education, and dysarthria strategies and compensations, Speak Out! lessons   Cephus Shelling, Student-SLP 02/08/2023, 3:05 PM

## 2023-02-24 NOTE — Therapy (Signed)
OUTPATIENT PHYSICAL THERAPY NEURO TREATMENT   Patient Name: Jordan [name] MRN: [id number] DOB:Jun 24, 1941, 82 y.o., male Today's Date: 02/24/2023   PCP: Johny Blamer, MD  REFERRING PROVIDER:  Horton Chin, MD  END OF SESSION:  PT End of Session - 02/24/23 1319     Visit Number 4    Number of Visits 9    Date for PT Re-Evaluation 04/10/23   due to delay in scheduling   Authorization Type [employer]    PT Start Time 1318   handoff w/SLP   PT Stop Time 1402    PT Time Calculation (min) 44 min    Equipment Utilized During Treatment Gait belt    Activity Tolerance Patient tolerated treatment well    Behavior During Therapy [facility] for tasks assessed/performed;Impulsive              Past Medical History:  Diagnosis Date   Arthritis    R shoulder, bone spur   Arthritis    "hands" (03/25/2016)   Chronic diastolic heart failure    Chronic lower back pain    Coronary heart disease    Dr Garnette Scheuermann   Diverticulitis 12/2021   DVT (deep venous thrombosis) 05/2022   left leg   H/O cardiovascular stress test    perhaps last one was 2009   Heart murmur    12/29/11 echo: mild MR, no AS, trivial TR   History of gout    Hyperlipidemia    Hypertension    saw Dr. Mendel Ryder last earl;y- 2014, cardiac cath. last done ?2009, blocks seen didn't require any intervention at that point.   Narcolepsy    [name] 04-11-97; Mean Latency 1.71min, SOREM 2   OSA on CPAP    NPSG 12-13-98 AHI 22.7   PONV (postoperative nausea and vomiting)    Presence of permanent cardiac pacemaker    Shortness of breath    with exertion    Stroke    Type II diabetes mellitus    Past Surgical History:  Procedure Laterality Date   CARDIAC CATHETERIZATION  2009?   EP IMPLANTABLE DEVICE N/A 03/25/2016   Procedure: Pacemaker Implant - Dual Chamber;  Surgeon: Marinus Maw, MD;  Location: [facility] INVASIVE CV LAB;  Service: Cardiovascular;  Laterality: N/A;   FRACTURE SURGERY     HIP FRACTURE SURGERY      INSERT / REPLACE / REMOVE PACEMAKER  03/24/2016   INTRAMEDULLARY (IM) NAIL INTERTROCHANTERIC Left 02/08/2022   Procedure: INTRAMEDULLARY (IM) NAIL INTERTROCHANTRIC;  Surgeon: Cammy Copa, MD;  Location: [facility] OR;  Service: Orthopedics;  Laterality: Left;   IR KYPHO LUMBAR INC FX REDUCE BONE BX UNI/BIL CANNULATION INC/IMAGING  12/29/2021   IR RADIOLOGIST EVAL & MGMT  12/15/2021   IR RADIOLOGY PERIPHERAL GUIDED IV START  07/14/2022   IR US GUIDE VASC ACCESS RIGHT  07/15/2022   LEFT AND RIGHT HEART CATHETERIZATION WITH CORONARY ANGIOGRAM N/A 06/05/2014   Procedure: LEFT AND RIGHT HEART CATHETERIZATION WITH CORONARY ANGIOGRAM;  Surgeon: Lesleigh Noe, MD;  Location: [facility] CATH LAB;  Service: Cardiovascular;  Laterality: N/A;   NASAL FRACTURE SURGERY     SHOULDER ARTHROSCOPY WITH ROTATOR CUFF REPAIR AND SUBACROMIAL DECOMPRESSION Right 08/16/2013   Procedure: SHOULDER ARTHROSCOPY WITH ROTATOR CUFF REPAIR AND SUBACROMIAL DECOMPRESSION;  Surgeon: Mable Paris, MD;  Location: [facility] OR;  Service: Orthopedics;  Laterality: Right;  Right shoulder arthroscopy rotator cuff repair, subacromial decompression.   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 07/23/2022   Acute deep vein thrombosis (DVT) of left lower extremity 04/29/2022   Closed intertrochanteric fracture of left hip, initial encounter    Closed left hip fracture (HCC) secondary to fall at home 02/08/2022   Paroxysmal atrial fibrillation 02/08/2022   Debility 01/08/2022   Constipation 01/07/2022   Physical deconditioning 01/07/2022   Acute metabolic encephalopathy 01/04/2022   Hypomagnesemia 01/04/2022   Pancreatitis 12/30/2021   Parkinson's disease 12/21/2021   Parkinson disease 12/18/2021   Frequent falls 12/18/2021   History of CVA (cerebrovascular accident) 12/18/2021   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   Hallucinations, visual 07/30/2021    Acetabulum fracture, left 06/25/2021   Acute hip pain, left 06/24/2021   Fall at home, initial encounter 06/24/2021   Leukocytosis 06/24/2021   Chronic diastolic heart failure    Sinusitis, acute maxillary 06/24/2020   AKI (acute kidney injury)    Labile blood glucose    Cough    Benign essential HTN    Skin lesion of hand    Hypoalbuminemia due to protein-calorie malnutrition    Controlled type 2 diabetes mellitus with hyperglycemia, without long-term current use of insulin    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   Morbid obesity    Acute blood loss anemia    Acute thalamic infarction 04/15/2020   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   TIA (transient ischemic attack) 02/17/2018   Type 2 diabetes mellitus with hyperlipidemia 02/17/2018   Insomnia 02/01/2018   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   Chronotropic incompetence 07/09/2014   Dyspnea on exertion 03/03/2014   HLD (hyperlipidemia) 12/13/2007   Essential hypertension 12/13/2007   Class 1 obesity 12/05/2007   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   Coronary atherosclerosis 12/05/2007    ONSET DATE: 01/14/2023  REFERRING DIAG: G20.A1 (ICD-10-CM) - Parkinson's disease, unspecified whether dyskinesia present, unspecified whether manifestations fluctuate   THERAPY DIAG:  Unsteadiness on feet  Other abnormalities of gait and mobility  Abnormal posture  Muscle weakness (generalized)  Other lack of coordination  Rationale for Evaluation and Treatment: Rehabilitation  SUBJECTIVE:                                                                                                                                                                                             SUBJECTIVE STATEMENT: Pt reports doing okay. "Is this going to hurt?" Saw a chiropractor this morning for a neck adjustment. No back pain today.   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 No Commonly has pain in neck and low back  Vitals:   02/24/23 1324  BP: 113/63  Pulse: 68     PRECAUTIONS: Fall  WEIGHT BEARING RESTRICTIONS: No  FALLS: Has patient fallen in last 6 months? No  LIVING ENVIRONMENT: Lives with: lives with their spouse Lives in: Other Condo Stairs:  On the 10th floor, there are 2 elevators  Has following equipment at home: Dan Humphreys - 2 wheeled, shower chair, Grab bars, and transport chair  PLOF: Independent with household mobility with device and Needs assistance with ADLs Wife helps with ADLs, pt can get dressed and bathe himself it just takes longer. "Socks take forever"   PATIENT GOALS: Pt wants to get back to driving. Wants to improve strength and endurance for walking.   OBJECTIVE:    COGNITION: Overall cognitive status: Impaired   SENSATION: WFL  COORDINATION: Heel to shin; Bradykinetic with LLE   POSTURE: rounded shoulders, forward head, increased thoracic kyphosis, posterior pelvic tilt, and flexed trunk   LOWER EXTREMITY ROM:     Decr AROM with LLE due to weakness, especially with knee extension.  LOWER EXTREMITY MMT:    MMT Right Eval Left Eval  Hip flexion 4 3+  Hip extension    Hip abduction 4 3+  Hip adduction 5 4+  Hip internal rotation    Hip external rotation    Knee flexion 5 4  Knee extension 4+ 3-  Ankle dorsiflexion 4+ 4  Ankle plantarflexion    Ankle inversion    Ankle eversion    (Blank rows = not tested)  All tested in sitting.   VITALS  Vitals:   02/24/23 1324  BP: 113/63  Pulse: 68       TODAY'S TREATMENT:         Ther Ex SciFit multi-peaks level 1.5 for 8 minutes using BUE/BLEs  for neural priming for reciprocal movement, dynamic cardiovascular warmup and increased amplitude of stepping. Cued pt to work at 6-7/10 RPE and for full ROM of BLEs, but pt pushing w/BLEs despite max multimodal cues and stated not understanding what RPE meant despite max visual and verbal cues.  Seated deadlifts using green resistance band under feet and pulling between feet, x20 reps, for improved posterior chain strength and postural control. Pt unable to maintain forward gaze w/verbal cues, so had pt's wife sit in front of pt and hold up random fingers and tasked pt to tell therapist how many fingers his wife was holding to obtain cervical extension, which worked well. Provided pt w/black theraband and verbally added to HEP.  Mini squats to WC w/two 4" airex pads in seat, x10 reps w/BUE support on RW, for improved glute and BLE strength. Pt performed well, again cued pt to report how many fingers wife was holding up to facilitate upright posture. Pt very fatigued with this, reporting 6/10 RPE. Used this opportunity to re-explain RPE and pt verbalized understanding.     PATIENT EDUCATION: Education details: Additions to HEP  Person educated: Patient and Spouse  Education method: Explanation, Facilities manager, Verbal cues, and Handouts Education comprehension: verbalized understanding, returned demonstration, and needs further education  HOME EXERCISE PROGRAM:  And walking program with RW at home   Access Code: [email or web address] URL: [email or web address]  Date: 02/15/2023 Prepared by: Sherlie Ban  Exercises - Seated Scapular Retraction  - 1 x daily - 7 x weekly - 2 sets - 10 reps - Seated Long Arc Quad  - 1 x daily - 7 x weekly - 2 sets - 10 reps - Seated March  - 1 x daily - 7 x weekly - 2 sets - 10 reps - Sit to Stand with Armchair  - 2 x daily - 7 x weekly - 2 sets - 5 reps - Supine Bridge  - 1 x daily - 7 x weekly - 1-2 sets - 10 reps  Verbally added:  - Seated deadlifts  using black theraband   GOALS: Goals reviewed with patient? Yes  SHORT TERM GOALS: ALL STGS = LTGS   LONG TERM GOALS: Target date: 03/09/2023         Pt will be independent with final HEP with wife supervision/assist in order to build upon functional gains made in therapy. Baseline:  Goal status: INITIAL  2.  Pt will improve 5x sit<>stand to less than or equal to 25 sec with UE support to demonstrate improved functional strength and transfer efficiency.  Baseline: 32.79 seconds with BUE support, performs from transport chair  Goal status: INITIAL  3.  Pt will improve gait speed with RW to at least 1.3 ft/sec in order to demo improved household mobility. Baseline: 31.15 seconds with RW= 1.05 ft/sec  Goal status: INITIAL  4.  Pt will ambulate greater than or equal to 100 feet on with RW and min A for improved cardiovascular endurance and BLE strength.    Baseline: 27' w/RW and mod A  Goal status: REVISED    ASSESSMENT:  CLINICAL IMPRESSION: Emphasis of skilled PT session on endurance, posterior chain and BLE strength. Pt unable to elicit 6/10 RPE on Scifit and could not understand therapist's education or cues regarding putting forth more effort. Pt performed deadlifts and mini squats well but did fatigue quickly. Used this opportunity to re-educate on RPE, which pt verbalized understanding with. Continue POC.    OBJECTIVE IMPAIRMENTS: Abnormal gait, cardiopulmonary status limiting activity, decreased activity tolerance, decreased balance, decreased cognition, decreased coordination, decreased endurance, decreased mobility, difficulty walking, decreased ROM, decreased strength, dizziness, impaired flexibility, impaired UE functional use, and postural dysfunction.   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   REHAB POTENTIAL: Fair due to chronicity of condition, decr carryover at home.   CLINICAL DECISION MAKING: Evolving/moderate complexity  EVALUATION COMPLEXITY: Moderate  PLAN:  PT FREQUENCY: 2x/week  PT DURATION: 8 weeks - due to potential delay in scheduling, only expect 4 weeks   PLANNED INTERVENTIONS: Therapeutic exercises, Therapeutic activity, Neuromuscular re-education, Balance training, Gait training, Patient/Family education, Self Care, Vestibular training, Canalith repositioning, DME instructions, Manual therapy, and Re-evaluation  PLAN FOR NEXT SESSION: Work on gait with RW for endurance, standing tasks/functional strength.    Jill Alexanders Adoni Greenough, PT, DPT 02/24/2023, 2:02 PM

## 2023-02-26 ENCOUNTER — Other Ambulatory Visit: Payer: Self-pay | Admitting: Physical Medicine and Rehabilitation

## 2023-03-01 ENCOUNTER — Encounter: Payer: Medicare (Managed Care) | Admitting: Speech Pathology

## 2023-03-01 ENCOUNTER — Ambulatory Visit: Payer: Medicare (Managed Care) | Admitting: Physical Therapy

## 2023-03-02 NOTE — Progress Notes (Signed)
Remote pacemaker transmission.   

## 2023-03-03 ENCOUNTER — Ambulatory Visit: Payer: Medicare (Managed Care) | Admitting: Physical Therapy

## 2023-03-03 ENCOUNTER — Ambulatory Visit: Payer: Medicare (Managed Care) | Admitting: Speech Pathology

## 2023-03-08 ENCOUNTER — Ambulatory Visit: Payer: Medicare (Managed Care) | Admitting: Speech Pathology

## 2023-03-08 ENCOUNTER — Ambulatory Visit: Payer: Medicare (Managed Care) | Admitting: Occupational Therapy

## 2023-03-08 ENCOUNTER — Ambulatory Visit: Payer: Medicare (Managed Care) | Admitting: Physical Therapy

## 2023-03-10 ENCOUNTER — Ambulatory Visit: Payer: Medicare (Managed Care) | Admitting: Hematology

## 2023-03-10 ENCOUNTER — Other Ambulatory Visit: Payer: Medicare (Managed Care)

## 2023-03-10 ENCOUNTER — Ambulatory Visit: Payer: Medicare (Managed Care) | Attending: Physical Medicine and Rehabilitation | Admitting: Occupational Therapy

## 2023-03-10 ENCOUNTER — Ambulatory Visit: Payer: Medicare (Managed Care) | Admitting: Physical Therapy

## 2023-03-10 ENCOUNTER — Ambulatory Visit: Payer: Medicare (Managed Care) | Admitting: Speech Pathology

## 2023-03-10 DIAGNOSIS — R293 Abnormal posture: Secondary | ICD-10-CM | POA: Diagnosis not present

## 2023-03-10 DIAGNOSIS — R251 Tremor, unspecified: Secondary | ICD-10-CM | POA: Diagnosis not present

## 2023-03-10 DIAGNOSIS — R278 Other lack of coordination: Secondary | ICD-10-CM | POA: Diagnosis not present

## 2023-03-10 DIAGNOSIS — R471 Dysarthria and anarthria: Secondary | ICD-10-CM | POA: Diagnosis not present

## 2023-03-10 DIAGNOSIS — R41841 Cognitive communication deficit: Secondary | ICD-10-CM | POA: Diagnosis not present

## 2023-03-10 DIAGNOSIS — M6281 Muscle weakness (generalized): Secondary | ICD-10-CM

## 2023-03-10 DIAGNOSIS — R2681 Unsteadiness on feet: Secondary | ICD-10-CM

## 2023-03-10 DIAGNOSIS — R41840 Attention and concentration deficit: Secondary | ICD-10-CM

## 2023-03-10 DIAGNOSIS — R2689 Other abnormalities of gait and mobility: Secondary | ICD-10-CM | POA: Insufficient documentation

## 2023-03-10 NOTE — Therapy (Signed)
OUTPATIENT SPEECH LANGUAGE PATHOLOGY [facility] EVALUATION   Patient Name: Jordan [name] MRN: [id number] DOB:1941-05-14, 82 y.o., male Today's Date: 03/10/2023  PCP: Johny Blamer, MD REFERRING PROVIDER: Horton Chin, MD  END OF SESSION:  End of Session - 03/10/23 1504     Visit Number 5    Number of Visits 9    Date for SLP Re-Evaluation 03/23/23   to accomodate scheduling   Authorization Type [employer]    SLP Start Time 1445    SLP Stop Time  1530    SLP Time Calculation (min) 45 min    Activity Tolerance Patient tolerated treatment well             Past Medical History:  Diagnosis Date   Arthritis    R shoulder, bone spur   Arthritis    "hands" (03/25/2016)   Chronic diastolic heart failure (HCC)    Chronic lower back pain    Coronary heart disease    Dr Garnette Scheuermann   Diverticulitis 12/2021   DVT (deep venous thrombosis) (HCC) 05/2022   left leg   H/O cardiovascular stress test    perhaps last one was 2009   Heart murmur    12/29/11 echo: mild MR, no AS, trivial TR   History of gout    Hyperlipidemia    Hypertension    saw Dr. Mendel Ryder last earl;y- 2014, cardiac cath. last done ?2009, blocks seen didn't require any intervention at that point.   Narcolepsy    [name] 04-11-97; Mean Latency 1.106min, SOREM 2   OSA on CPAP    NPSG 12-13-98 AHI 22.7   PONV (postoperative nausea and vomiting)    Presence of permanent cardiac pacemaker    Shortness of breath    with exertion    Stroke (HCC)    Type II diabetes mellitus (HCC)    Past Surgical History:  Procedure Laterality Date   CARDIAC CATHETERIZATION  2009?   EP IMPLANTABLE DEVICE N/A 03/25/2016   Procedure: Pacemaker Implant - Dual Chamber;  Surgeon: Marinus Maw, MD;  Location: [facility] INVASIVE CV LAB;  Service: Cardiovascular;  Laterality: N/A;   FRACTURE SURGERY     HIP FRACTURE SURGERY     INSERT / REPLACE / REMOVE PACEMAKER  03/24/2016   INTRAMEDULLARY (IM) NAIL INTERTROCHANTERIC Left  02/08/2022   Procedure: INTRAMEDULLARY (IM) NAIL INTERTROCHANTRIC;  Surgeon: Cammy Copa, MD;  Location: [facility] OR;  Service: Orthopedics;  Laterality: Left;   IR KYPHO LUMBAR INC FX REDUCE BONE BX UNI/BIL CANNULATION INC/IMAGING  12/29/2021   IR RADIOLOGIST EVAL & MGMT  12/15/2021   IR RADIOLOGY PERIPHERAL GUIDED IV START  07/14/2022   IR US GUIDE VASC ACCESS RIGHT  07/15/2022   LEFT AND RIGHT HEART CATHETERIZATION WITH CORONARY ANGIOGRAM N/A 06/05/2014   Procedure: LEFT AND RIGHT HEART CATHETERIZATION WITH CORONARY ANGIOGRAM;  Surgeon: Lesleigh Noe, MD;  Location: [facility] CATH LAB;  Service: Cardiovascular;  Laterality: N/A;   NASAL FRACTURE SURGERY     SHOULDER ARTHROSCOPY WITH ROTATOR CUFF REPAIR AND SUBACROMIAL DECOMPRESSION Right 08/16/2013   Procedure: SHOULDER ARTHROSCOPY WITH ROTATOR CUFF REPAIR AND SUBACROMIAL DECOMPRESSION;  Surgeon: Mable Paris, MD;  Location: [facility] OR;  Service: Orthopedics;  Laterality: Right;  Right shoulder arthroscopy rotator cuff repair, subacromial decompression.   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   Acute deep vein thrombosis (DVT) of left lower  extremity (HCC) 04/29/2022   Closed intertrochanteric fracture of left hip, initial encounter [facility])    Closed left hip fracture (HCC) secondary to fall at home 02/08/2022   Paroxysmal atrial fibrillation (HCC) 02/08/2022   Debility 01/08/2022   Constipation 01/07/2022   Physical deconditioning 01/07/2022   Acute metabolic encephalopathy 01/04/2022   Hypomagnesemia 01/04/2022   Pancreatitis 12/30/2021   Parkinson's disease 12/21/2021   Parkinson disease 12/18/2021   Frequent falls 12/18/2021   History of CVA (cerebrovascular accident) 12/18/2021   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   Hallucinations, visual 07/30/2021   Acetabulum fracture, left (HCC) 06/25/2021   Acute hip pain, left  06/24/2021   Fall at home, initial encounter 06/24/2021   Leukocytosis 06/24/2021   Chronic diastolic heart failure (HCC)    Sinusitis, acute maxillary 06/24/2020   AKI (acute kidney injury) (HCC)    Labile blood glucose    Cough    Benign essential HTN    Skin lesion of hand    Hypoalbuminemia due to protein-calorie malnutrition (HCC)    Controlled type 2 diabetes mellitus with hyperglycemia, without long-term current use of insulin (HCC)    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   Morbid obesity (HCC)    Acute blood loss anemia    Acute thalamic infarction (HCC) 04/15/2020   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   TIA (transient ischemic attack) 02/17/2018   Type 2 diabetes mellitus with hyperlipidemia (HCC) 02/17/2018   Insomnia 02/01/2018   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   Chronotropic incompetence 07/09/2014   Dyspnea on exertion 03/03/2014   HLD (hyperlipidemia) 12/13/2007   Essential hypertension 12/13/2007   Class 1 obesity 12/05/2007   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   PERTINENT HISTORY: Hx of prior stroke and parkinsonism. At times, will have difficulty holding head up and sits in a hunched over position. He will have difficulty holding on to small, especially lighter objects, right > left since stroke. C/o shortness of breath after talking for prolonged periods of time which has been present over the past 4-5 months. Denies SOB on exertion but wife believes that he does. Wife  also notes voice being softer. Denies any swallowing difficulties. He also c/o continued fatigue.   PAIN:  Are you having pain? No  FALLS: Has patient fallen in last 6 months?  See PT evaluation for details  LIVING ENVIRONMENT: Lives with: lives with their spouse Lives in: House/apartment  PLOF:  Level of assistance: Needed assistance with ADLs, Needed assistance with IADLS Employment: Retired  PATIENT GOALS: not have people ask to repeat himself so often   OBJECTIVE:   TODAY'S TREATMENT:   03/10/23: SLP assisted in generation of hand signal pt and spouse can use to facilitate improved vocal intensity during social engagements. Pt requires pause to process decided upon hand signal but then does demonstrate improved volume. Education provided on sensory information being important to brain and cognitive health, SLP recommends continued wearing of hearing aids. Pt reports previously enjoyed reading, vision is impacting participation. SLP provided alternative means which may be facilitative.   02/24/23: Pt questioning whether imaging completion of speech exercises will help with  speech. SLP provides education to support active practice in SLP. Pt verbalizes agreement, if not full understanding of why he cannot just imagine practicing. Led pt through speak out lesson 8, abbreviated. Pt averages 85 dB during warm ups, 74 dB during reading. Incorporate noise interference to aid in communication in challenging environments, with pt successfully able to communicate thoughts and ideas across 5 minute conversation. Plan to target identifying visual cues next session to ID when volume drops.   02/22/23: Pt reporting challenges with cell phone usage, specifically in texting accurately. SLP instructs on use of talk to text to support multimodal communication with cell phone. Pt able to demonstrate use in sending x5 messages with usual verbal cues. Will likely need additional training. Education on practice  making strategy more facilitative. Spouse able to teach back and will be able to assist pt in home practice. Visual aid provided. Plan to review talk to text and target carryover of increased volume with presence of distractions in next session.   02-17-23: Pt entered room averaging conversational speech at 66 dB. Pt completed Speak Out! lesson 14. SLP provided initial model for each warm up and exercise.     Loud "AH" - 85 dB  Pitch Glide - 84 dB   Counting - 72 dB   Reading - 74 dB  Cognitive -  74 dB    Pt required occasional min-A (gestural and verbal) to increase his volume. Pt demonstrated intermittent awareness of low volume, although required cueing to correct.   02-09-23: Eval completed this date. Initiated education on speaking with intent and dysarthria strategies. Plan to practice reading other people's cues for pt to increase volume when speaking in the community and also in noisy environment. Pt expressed concern for his hearing aids making everyone sound as if they were speaking too loudly. SLP discussed option of a microphone that could connect through bluetooth to his hearing aids to focus on nearby conversation instead of picking up all environmental noise.   PATIENT EDUCATION: Education details: See above Person educated: Patient and Spouse Education method: Explanation Education comprehension: verbalized understanding and needs further education  HOME EXERCISE PROGRAM: Speak Out! lessons   GOALS: Goals reviewed with patient? Yes  SHORT TERM GOALS: Target date: 03/09/2023    Pt will average 70 dB or greater during 2+ paragraph reading with mod-A Baseline: Goal status: IN PROGRESS  2.  Pt will ID volume decay in structured tasks given visual cues in 70% of opportunities over 2 sessions Baseline:  Goal status: IN PROGRESS  3.  Pt will implement external aid to optimize vocal intensity (averaging 68 dB +) during 10 minute conversation Baseline:  Goal status: IN  PROGRESS   LONG TERM GOALS: Target date: 04/06/2023  Pt will improve volume PRN in 80% of opportunities given visual cue only over 20 minute conversation in environment with background noise  Baseline:  Goal status: IN PROGRESS  2.  Pt and spouse will report carryover of strategies and compensations resulting in subjective perception of improved communication efficacy by d/c Baseline:  Goal status: IN PROGRESS   ASSESSMENT:  CLINICAL IMPRESSION: Pt completed Speak Out! Lesson this date to target hypokinetic dysarthria. Re-instructed pt in dysarthria strategies and compensations. Pr required occasional min-A to increase his volume. Continue to recommend skilled ST to optimize communication efficacy and prevent further decline of pt's speaking efficacy.    OBJECTIVE IMPAIRMENTS: Objective impairments include dysarthria. These impairments are limiting patient from effectively communicating at home and in community.Factors affecting  potential to achieve goals and functional outcome are medical prognosis.. Patient will benefit from skilled SLP services to address above impairments and improve overall function.  REHAB POTENTIAL: Good  PLAN:  SLP FREQUENCY: 2x/week  SLP DURATION: 8 weeks  PLANNED INTERVENTIONS: SLP instruction and feedback, Compensatory strategies, Patient/family education, and dysarthria strategies and compensations, Speak Out! lessons   Cephus Shelling, Student-SLP 02/08/2023, 3:05 PM

## 2023-03-10 NOTE — Therapy (Signed)
OUTPATIENT OCCUPATIONAL THERAPY P[facility]'S TREATMENT  Patient Name: Jordan [name] MRN: [id number] DOB:1941-06-19, 82 y.o., male Today's Date: 03/10/2023  PCP: Johny Blamer, MD  REFERRING PROVIDER: Horton Chin, MD  END OF SESSION:  OT End of Session - 03/10/23 1621     Visit Number 2    Number of Visits 21    Date for OT Re-Evaluation 04/22/23    Authorization Type [employer] Medicare - [place], no auth required    Progress Note Due on Visit 10    OT Start Time 1620    OT Stop Time 1701    OT Time Calculation (min) 41 min    Activity Tolerance Patient tolerated treatment well    Behavior During Therapy WFL for tasks assessed/performed              Past Medical History:  Diagnosis Date   Arthritis    R shoulder, bone spur   Arthritis    "hands" (03/25/2016)   Chronic diastolic heart failure (HCC)    Chronic lower back pain    Coronary heart disease    Dr Garnette Scheuermann   Diverticulitis 12/2021   DVT (deep venous thrombosis) (HCC) 05/2022   left leg   H/O cardiovascular stress test    perhaps last one was 2009   Heart murmur    12/29/11 echo: mild MR, no AS, trivial TR   History of gout    Hyperlipidemia    Hypertension    saw Dr. Mendel Ryder last earl;y- 2014, cardiac cath. last done ?2009, blocks seen didn't require any intervention at that point.   Narcolepsy    [name] 04-11-97; Mean Latency 1.11min, SOREM 2   OSA on CPAP    NPSG 12-13-98 AHI 22.7   PONV (postoperative nausea and vomiting)    Presence of permanent cardiac pacemaker    Shortness of breath    with exertion    Stroke (HCC)    Type II diabetes mellitus (HCC)    Past Surgical History:  Procedure Laterality Date   CARDIAC CATHETERIZATION  2009?   EP IMPLANTABLE DEVICE N/A 03/25/2016   Procedure: Pacemaker Implant - Dual Chamber;  Surgeon: Marinus Maw, MD;  Location: [facility] INVASIVE CV LAB;  Service: Cardiovascular;  Laterality: N/A;   FRACTURE SURGERY     HIP FRACTURE SURGERY     INSERT / REPLACE /  REMOVE PACEMAKER  03/24/2016   INTRAMEDULLARY (IM) NAIL INTERTROCHANTERIC Left 02/08/2022   Procedure: INTRAMEDULLARY (IM) NAIL INTERTROCHANTRIC;  Surgeon: Cammy Copa, MD;  Location: [facility] OR;  Service: Orthopedics;  Laterality: Left;   IR KYPHO LUMBAR INC FX REDUCE BONE BX UNI/BIL CANNULATION INC/IMAGING  12/29/2021   IR RADIOLOGIST EVAL & MGMT  12/15/2021   IR RADIOLOGY PERIPHERAL GUIDED IV START  07/14/2022   IR US GUIDE VASC ACCESS RIGHT  07/15/2022   LEFT AND RIGHT HEART CATHETERIZATION WITH CORONARY ANGIOGRAM N/A 06/05/2014   Procedure: LEFT AND RIGHT HEART CATHETERIZATION WITH CORONARY ANGIOGRAM;  Surgeon: Lesleigh Noe, MD;  Location: [facility] CATH LAB;  Service: Cardiovascular;  Laterality: N/A;   NASAL FRACTURE SURGERY     SHOULDER ARTHROSCOPY WITH ROTATOR CUFF REPAIR AND SUBACROMIAL DECOMPRESSION Right 08/16/2013   Procedure: SHOULDER ARTHROSCOPY WITH ROTATOR CUFF REPAIR AND SUBACROMIAL DECOMPRESSION;  Surgeon: Mable Paris, MD;  Location: [facility] OR;  Service: Orthopedics;  Laterality: Right;  Right shoulder arthroscopy rotator cuff repair, subacromial decompression.   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   Acute deep vein thrombosis (DVT) of left lower extremity (HCC) 04/29/2022   Closed intertrochanteric fracture of left hip, initial encounter [facility])    Closed left hip fracture (HCC) secondary to fall at home 02/08/2022   Paroxysmal atrial fibrillation (HCC) 02/08/2022   Debility 01/08/2022   Constipation 01/07/2022   Physical deconditioning 01/07/2022   Acute metabolic encephalopathy 01/04/2022   Hypomagnesemia 01/04/2022   Pancreatitis 12/30/2021   Parkinson's disease 12/21/2021   Parkinson disease 12/18/2021   Frequent falls 12/18/2021   History of CVA (cerebrovascular accident) 12/18/2021   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   Hallucinations, visual  07/30/2021   Acetabulum fracture, left (HCC) 06/25/2021   Acute hip pain, left 06/24/2021   Fall at home, initial encounter 06/24/2021   Leukocytosis 06/24/2021   Chronic diastolic heart failure (HCC)    Sinusitis, acute maxillary 06/24/2020   AKI (acute kidney injury) (HCC)    Labile blood glucose    Cough    Benign essential HTN    Skin lesion of hand    Hypoalbuminemia due to protein-calorie malnutrition (HCC)    Controlled type 2 diabetes mellitus with hyperglycemia, without long-term current use of insulin (HCC)    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   Morbid obesity (HCC)    Acute blood loss anemia    Acute thalamic infarction (HCC) 04/15/2020   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   TIA (transient ischemic attack) 02/17/2018   Type 2 diabetes mellitus with hyperlipidemia (HCC) 02/17/2018   Insomnia 02/01/2018   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   Chronotropic incompetence 07/09/2014   Dyspnea on exertion 03/03/2014   HLD (hyperlipidemia) 12/13/2007   Essential hypertension 12/13/2007   Class 1 obesity 12/05/2007   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  Pt accompanied by:  wife, [name]  PERTINENT HISTORY: "[name] is an 82 year old man who presents for follow-up after hip fracture.    1) Left hip fracture:  -not walking much, but can walk with rolling walker I condominium unit.  -his wife accompanies him  today and provides most of history -his walking discharge is very short.  -no longer painful -has weakness in bilateral lower extremities -no more falls.    2) Parkinson's: -sometimes muscles don't cooperate the way they should. -he has vascular parkinsonism -has stooped posture   3) Right leg weakness: -when walking.  -wife is nervous about him falling -he was in skilled rehab and was discharged June 8th  -they have not yet set up outpatient therapy -he has not had any professional therapy since discharge on June 8th.    4) 6 bottom teeth removed -has more of a challenge in forming words.    5) Neuroendocrine tumor -being treated with monthly octreotide -no current symptoms currently -was having persistent abdominal pain- they thought it was a flare of diverticulitis but this was not present -CBGs increase because of octreotide- CBGs as high as 250/260 -he asks for icecream   He was  diagnosed with a vascular Parkinsonism in  Jan 2022 and has history of CVA in June 2021 per pt report and spouse. Pt fell and sustained hip fx on 02/08/22 and pt underwent left  IM nailing.  04/30/22:  hospitalized with blood clot LLE--changed meds and cleared to resume OT/PT."  PRECAUTIONS: Fall  WEIGHT BEARING RESTRICTIONS: No  PAIN:  Are you having pain? No  FALLS: Has patient fallen in last 6 months? No  Lives with: lives with their spouse Lives in: Other Condo Stairs:  On the 10th floor, there are 2 elevators  Has following equipment at home: Dan Humphreys - 2 wheeled, shower chair, Grab bars, and transport chair   PLOF: Independent with household mobility with device and Needs assistance with ADLs Wife helps with ADLs, pt can get dressed and bathe himself it just takes longer. "Socks take forever"   PATIENT GOALS: Improve UE functional use  OBJECTIVE:   HAND DOMINANCE: Right  ADLs: Overall ADLs: min to moderate assistance (requires more assistance due to time  constraints)  IADLs: Handwriting: 25% legible, Increased time, and Severe micrographia  MOBILITY STATUS: Needs Assist: uses transport chair, dependent for navigation  ACTIVITY TOLERANCE: Activity tolerance: fair  FUNCTIONAL OUTCOME MEASURES: PSFS:  COORDINATION: Box and Blocks:  Right 31 blocks, Left 34 blocks  UE ROM:  WFL  UE MMT:   WFL  SENSATION: Reports paresthesias in R hand all fingers  MUSCLE TONE: not fully tested this visit  COGNITION: Overall cognitive status: Impaired  OBSERVATIONS: Bradykinesia and Postural tremors   TODAY'S TREATMENT:                                                                                                                               OT initiated pen tricks as noted in patient instructions for improved coordination of BUE.  Patient cued to place hands on table or counter as he has a tendency to look down at his hands, which can further promote kyphotic posture.  Patient also encouraged to keep forearm supinated with translation due to typically diminished postures associated with Parkinson's.   PATIENT EDUCATION: Education details: coordination HEP Person educated: Patient and Spouse Education method: Explanation, Demonstration, and Handouts Education comprehension: verbalized understanding, returned demonstration, and needs further education  HOME EXERCISE PROGRAM: 5/9: pen tricks  GOALS:  SHORT TERM GOALS: Target date: 03/11/2023    Pt will be independent with PD specific HEP.  Baseline: not yet initiated Goal status: INITIAL  2.  Pt will verbalize understanding of adapted strategies to maximize safety and independence with ADLs/IADLs.  Baseline: not yet initiated Goal status: INITIAL  3.  Pt will write a sentence with no significant decrease in size and maintain 50% legibility.  Baseline: 25% legibility Goal status: INITIAL  4.  Will assess 9 hole peg and set appropriate LTG.  Baseline:  Goal status:  INITIAL  5.  Will assess 3 button/button time and set appropriate LTG.  Baseline:  Goal status: INITIAL  6.  Will assess  PPT#4 (don/doff jacket and set appropriate goal) Baseline:  Goal status: INITIAL  LONG TERM GOALS: Target date: 04/22/2023    Pt will be able to place at least 35 blocks using each hand with completion of Box and Blocks test. Baseline: Right 31 blocks, Left 34 blocks Goal status: INITIAL  2.  Patient will report at least two-point increase in average PSFS score or at least three-point increase in a single activity score indicating functionally significant improvement given minimum detectable change. Baseline: 3 total score (see above for single activity scores) Goal status: INITIAL  3.  Pt will verbalize understanding of ways to prevent future PD related complications and PD community resources.  Baseline: not yet initiated Goal status: INITIAL  4.  Pt will write a sentence with no significant decrease in size and maintain 75% legibility.  Baseline: 25% legibility Goal status: INITIAL   ASSESSMENT:  CLINICAL IMPRESSION: Pt would benefit from skilled OT services in the outpatient setting to work on impairments as noted below.  Patient limited by cognition and carryover.  Despite this, he is willing to participate in therapy. PERFORMANCE DEFICITS: in functional skills including ADLs, IADLs, coordination, sensation, strength, pain, Fine motor control, Gross motor control, mobility, balance, endurance, decreased knowledge of use of DME, and UE functional use, cognitive skills including attention, memory, problem solving, and safety awareness.  IMPAIRMENTS: are limiting patient from ADLs, IADLs, rest and sleep, leisure, and social participation.   COMORBIDITIES:  may have co-morbidities  that affects occupational performance. Patient will benefit from skilled OT to address above impairments and improve overall function.  REHAB POTENTIAL: Fair given PD  prognosis   PLAN:  OT FREQUENCY: 2x/week  OT DURATION: 10 weeks  PLANNED INTERVENTIONS: self care/ADL training, therapeutic exercise, therapeutic activity, manual therapy, passive range of motion, balance training, functional mobility training, splinting, ultrasound, paraffin, fluidotherapy, moist heat, patient/family education, cognitive remediation/compensation, energy conservation, coping strategies training, DME and/or AE instructions, and Re-evaluation  RECOMMENDED OTHER SERVICES: none at this time  CONSULTED AND AGREED WITH PLAN OF CARE: Patient and family member/caregiver  PLAN FOR NEXT SESSION: Review Pen tricks and coordination/PD handouts/HEP from previous therapy episode   Delana Meyer, OT 03/10/2023, 5:20 PM

## 2023-03-10 NOTE — Therapy (Signed)
OUTPATIENT PHYSICAL THERAPY NEURO TREATMENT   Patient Name: Jordan [name] MRN: [id number] DOB:[birth year], 82 y.o., male Today's Date: 03/10/2023   PCP: Johny Blamer, MD  REFERRING PROVIDER:  Horton Chin, MD  END OF SESSION:  PT End of Session - 03/10/23 1534     Visit Number 5    Number of Visits 9    Date for PT Re-Evaluation 04/10/23   due to delay in scheduling   Authorization Type [employer]    PT Start Time 1533    PT Stop Time 1618    PT Time Calculation (min) 45 min    Equipment Utilized During Treatment --    Activity Tolerance Patient tolerated treatment well    Behavior During Therapy [facility] for tasks assessed/performed;Impulsive               Past Medical History:  Diagnosis Date   Arthritis    R shoulder, bone spur   Arthritis    "hands" (03/25/2016)   Chronic diastolic heart failure (HCC)    Chronic lower back pain    Coronary heart disease    Dr Garnette Scheuermann   Diverticulitis 12/2021   DVT (deep venous thrombosis) (HCC) 05/2022   left leg   H/O cardiovascular stress test    perhaps last one was 2009   Heart murmur    12/29/11 echo: mild MR, no AS, trivial TR   History of gout    Hyperlipidemia    Hypertension    saw Dr. Mendel Ryder last earl;y- 2014, cardiac cath. last done ?2009, blocks seen didn't require any intervention at that point.   Narcolepsy    [name] 04-11-97; Mean Latency 1.25min, SOREM 2   OSA on CPAP    NPSG 12-13-98 AHI 22.7   PONV (postoperative nausea and vomiting)    Presence of permanent cardiac pacemaker    Shortness of breath    with exertion    Stroke (HCC)    Type II diabetes mellitus (HCC)    Past Surgical History:  Procedure Laterality Date   CARDIAC CATHETERIZATION  2009?   EP IMPLANTABLE DEVICE N/A 03/25/2016   Procedure: Pacemaker Implant - Dual Chamber;  Surgeon: Marinus Maw, MD;  Location: [facility] INVASIVE CV LAB;  Service: Cardiovascular;  Laterality: N/A;   FRACTURE SURGERY     HIP FRACTURE SURGERY      INSERT / REPLACE / REMOVE PACEMAKER  03/24/2016   INTRAMEDULLARY (IM) NAIL INTERTROCHANTERIC Left 02/08/2022   Procedure: INTRAMEDULLARY (IM) NAIL INTERTROCHANTRIC;  Surgeon: Cammy Copa, MD;  Location: [facility] OR;  Service: Orthopedics;  Laterality: Left;   IR KYPHO LUMBAR INC FX REDUCE BONE BX UNI/BIL CANNULATION INC/IMAGING  12/29/2021   IR RADIOLOGIST EVAL & MGMT  12/15/2021   IR RADIOLOGY PERIPHERAL GUIDED IV START  07/14/2022   IR US GUIDE VASC ACCESS RIGHT  07/15/2022   LEFT AND RIGHT HEART CATHETERIZATION WITH CORONARY ANGIOGRAM N/A 06/05/2014   Procedure: LEFT AND RIGHT HEART CATHETERIZATION WITH CORONARY ANGIOGRAM;  Surgeon: Lesleigh Noe, MD;  Location: [facility] CATH LAB;  Service: Cardiovascular;  Laterality: N/A;   NASAL FRACTURE SURGERY     SHOULDER ARTHROSCOPY WITH ROTATOR CUFF REPAIR AND SUBACROMIAL DECOMPRESSION Right 08/16/2013   Procedure: SHOULDER ARTHROSCOPY WITH ROTATOR CUFF REPAIR AND SUBACROMIAL DECOMPRESSION;  Surgeon: Mable Paris, MD;  Location: [facility] OR;  Service: Orthopedics;  Laterality: Right;  Right shoulder arthroscopy rotator cuff repair, subacromial decompression.   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   Acute deep vein thrombosis (DVT) of left lower extremity (HCC) 04/29/2022   Closed intertrochanteric fracture of left hip, initial encounter [facility])    Closed left hip fracture (HCC) secondary to fall at home 02/08/2022   Paroxysmal atrial fibrillation (HCC) 02/08/2022   Debility 01/08/2022   Constipation 01/07/2022   Physical deconditioning 01/07/2022   Acute metabolic encephalopathy 01/04/2022   Hypomagnesemia 01/04/2022   Pancreatitis 12/30/2021   Parkinson's disease 12/21/2021   Parkinson disease 12/18/2021   Frequent falls 12/18/2021   History of CVA (cerebrovascular accident) 12/18/2021   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    Hallucinations, visual 07/30/2021   Acetabulum fracture, left (HCC) 06/25/2021   Acute hip pain, left 06/24/2021   Fall at home, initial encounter 06/24/2021   Leukocytosis 06/24/2021   Chronic diastolic heart failure (HCC)    Sinusitis, acute maxillary 06/24/2020   AKI (acute kidney injury) (HCC)    Labile blood glucose    Cough    Benign essential HTN    Skin lesion of hand    Hypoalbuminemia due to protein-calorie malnutrition (HCC)    Controlled type 2 diabetes mellitus with hyperglycemia, without long-term current use of insulin (HCC)    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   Morbid obesity (HCC)    Acute blood loss anemia    Acute thalamic infarction (HCC) 04/15/2020   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   TIA (transient ischemic attack) 02/17/2018   Type 2 diabetes mellitus with hyperlipidemia (HCC) 02/17/2018   Insomnia 02/01/2018   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   Chronotropic incompetence 07/09/2014   Dyspnea on exertion 03/03/2014   HLD (hyperlipidemia) 12/13/2007   Essential hypertension 12/13/2007   Class 1 obesity 12/05/2007   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   Coronary atherosclerosis 12/05/2007    ONSET DATE: 01/14/2023  REFERRING DIAG: G20.A1 (ICD-10-CM) - Parkinson's disease, unspecified whether dyskinesia present, unspecified whether manifestations fluctuate   THERAPY DIAG:  Other abnormalities of gait and mobility  Unsteadiness on feet  Abnormal posture  Muscle weakness (generalized)  Rationale for Evaluation and Treatment: Rehabilitation  SUBJECTIVE:                                                                                                                                                                                             SUBJECTIVE STATEMENT: Pt reports he was reaching for his long handle shoe horn yesterday w/his R shoulder and reached too far and had to brace against the wall. "I  stretched by back out as far as it can go".  No falls.   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 No Commonly has pain in neck and low back  There were no vitals filed for this visit.    PRECAUTIONS: Fall  WEIGHT BEARING RESTRICTIONS: No  FALLS: Has patient fallen in last 6 months? No  LIVING ENVIRONMENT: Lives with: lives with their spouse Lives in: Other Condo Stairs:  On the 10th floor, there are 2 elevators  Has following equipment at home: Dan Humphreys - 2 wheeled, shower chair, Grab bars, and transport chair  PLOF: Independent with household mobility with device and Needs assistance with ADLs Wife helps with ADLs, pt can get dressed and bathe himself it just takes longer. "Socks take forever"   PATIENT GOALS: Pt wants to get back to driving. Wants to improve strength and endurance for walking.   OBJECTIVE:    COGNITION: Overall cognitive status: Impaired   SENSATION: WFL  COORDINATION: Heel to shin; Bradykinetic with LLE   POSTURE: rounded shoulders, forward head, increased thoracic kyphosis, posterior pelvic tilt, and flexed trunk   LOWER EXTREMITY ROM:     Decr AROM with LLE due to weakness, especially with knee extension.  LOWER EXTREMITY MMT:    MMT Right Eval Left Eval  Hip flexion 4 3+  Hip extension    Hip abduction 4 3+  Hip adduction 5 4+  Hip internal rotation    Hip external rotation    Knee flexion 5 4  Knee extension 4+ 3-  Ankle dorsiflexion 4+ 4  Ankle plantarflexion    Ankle inversion    Ankle eversion    (Blank rows = not tested)  All tested in sitting.   VITALS  There were no vitals filed for this  visit.      TODAY'S TREATMENT:         Self-care/home management  Entirety of session spent discussing pt's goals and realistic expectations for therapy. Pt's wife requesting to add more PT appointments, but nothing available for next few weeks that matches current OT schedule. Informed pt that in order to make functional gains, he must be working on walking at home w/RW, which he currently is not doing. Pt stating his goals are to walk at [facility], perform floor transfers independently and navigate stairs. Informed pt that these are unsafe and unrealistic goals for now, as he is only able to ambulate ~25-50' at a time w/assistance prior to requesting to sit down due to back pain caused by poor posture. Informed pt of more realistic goals, such as walking for 150' or 3-5 minutes without stopping. Encouraged pt to write down realistic goals on a paper and post them somewhere in his home where he can see them to remind him to work on walking. Wife inquiring about use of rollator or Up-walker, but informed her and pt that these are unsafe for pt due to his significant forward lean and reliance on BUEs on RW. Pt and wife in agreement to work on walking at home in long hallway over next few weeks until next PT appointment to improve BLE strength, posture, endurance and activity tolerance. Informed pt to strive for walking up/down hallway x2 without break  or to walk for 3-5 continuous minutes w/good posture while using RW. Pt informed if he does not work on walking/HEP at home, therapist cannot justify continuing PT. Pt and wife verbalized understanding.   PATIENT EDUCATION: Education details: See above Person educated: Patient and Spouse  Education method: Explanation Education comprehension: verbalized understanding  HOME EXERCISE PROGRAM:  And walking program with RW at home   Access Code: [email or web address] URL: [email or web address] Date: 02/15/2023 Prepared by: Sherlie Ban  Exercises - Seated Scapular Retraction  - 1 x daily - 7 x weekly - 2 sets - 10 reps - Seated Long Arc Quad  - 1 x daily - 7 x weekly - 2 sets - 10 reps - Seated March  - 1 x daily - 7 x weekly - 2 sets - 10 reps - Sit to Stand with Armchair  - 2 x daily - 7 x weekly - 2 sets - 5 reps - Supine Bridge  - 1 x daily - 7 x weekly - 1-2 sets - 10 reps  Verbally added:  - Seated deadlifts using black theraband   GOALS: Goals reviewed with patient? Yes  SHORT TERM GOALS: ALL STGS = LTGS   LONG TERM GOALS: Target date: 04/07/2023 (updated for extended POC)            Pt will be independent with final HEP with wife supervision/assist in order to build upon functional gains made in therapy. Baseline:  Goal status: IN PROGRESS  2.  Pt will improve 5x sit<>stand to less than or equal to 25 sec with UE support to demonstrate improved functional strength and transfer efficiency.  Baseline: 32.79 seconds with BUE support, performs from transport chair  Goal status: INITIAL  3.  Pt will improve gait speed with RW to at least 1.3 ft/sec in order to demo improved household mobility. Baseline: 31.15 seconds with RW= 1.05 ft/sec  Goal status: INITIAL  4.  Pt will ambulate greater than or equal to 100 feet on with RW and min A for improved cardiovascular endurance and BLE strength.    Baseline: 49' w/RW and mod A  Goal status: REVISED    ASSESSMENT:  CLINICAL IMPRESSION: Emphasis of skilled PT session on LTG assessment and pt and wife education regarding realistic goals for PT and importance of pt walking at home. Pt is progressing towards goal #1 and is currently only ambulating from bed to [facility] x2 during day and is in [facility] remainder of time. Pt reports goals of walking at [facility] and performing floor transfers, so provided pt with list of realistic STGs to work on in order to improve strength, activity tolerance and safety w/gait (walking in short hallway of home). Unable to  schedule PT for next few weeks to match w/OT schedule, so pt and wife in agreement to use this time to work on walking at home and will reassess goals upon return. Did not assess remainder of LTGs this date as pt has missed past two weeks of therapy to go on vacation. Extended goal date by 3 weeks to match updated POC. Continue POC.    OBJECTIVE IMPAIRMENTS: Abnormal gait, cardiopulmonary status limiting activity, decreased activity tolerance, decreased balance, decreased cognition, decreased coordination, decreased endurance, decreased mobility, difficulty walking, decreased ROM, decreased strength, dizziness, impaired flexibility, impaired UE functional use, and postural dysfunction.   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   REHAB POTENTIAL: Fair due to chronicity of condition, decr carryover at home.   CLINICAL DECISION MAKING: Evolving/moderate complexity  EVALUATION COMPLEXITY: Moderate  PLAN:  PT FREQUENCY: 2x/week  PT DURATION: 8 weeks - due to potential delay in scheduling, only expect 4 weeks   PLANNED INTERVENTIONS: Therapeutic exercises, Therapeutic activity, Neuromuscular re-education, Balance training, Gait training, Patient/Family education, Self Care, Vestibular training, Canalith repositioning, DME instructions, Manual therapy, and Re-evaluation  PLAN  FOR NEXT SESSION: How is walking program?? Work on gait with RW for endurance, standing tasks/functional strength.    Jill Alexanders Aadon Gorelik, PT, DPT 03/10/2023, 4:20 PM

## 2023-03-10 NOTE — Patient Instructions (Signed)
"  Pen Tricks" Bigger object = easier  Rotation- Twirl pen like a baton 10-15 x one direction, then switch directions   Shift- Hold pen like a dart at the tip and shift the pen ("inch worm") until holding at the base of the pen. Shift back to the tip of the pen and repeat 10-15x   Flip- With hand down on the table, hold pen in writing position, then flip it to an erase position. Flip it back to writing position and repeat 10-15x   Translation- With your palm up to the sky, put an object in your palm. Without tipping your hand, use your fingers and thumb to move the object to the tips of each of your fingers, one-at-a-time.  Repeat 5-10x each finger. 

## 2023-03-11 ENCOUNTER — Other Ambulatory Visit: Payer: Self-pay

## 2023-03-11 ENCOUNTER — Inpatient Hospital Stay: Payer: Medicare (Managed Care) | Attending: Physician Assistant

## 2023-03-11 ENCOUNTER — Inpatient Hospital Stay (HOSPITAL_BASED_OUTPATIENT_CLINIC_OR_DEPARTMENT_OTHER): Payer: Medicare (Managed Care) | Admitting: Hematology

## 2023-03-11 ENCOUNTER — Encounter: Payer: Self-pay | Admitting: Hematology

## 2023-03-11 VITALS — BP 104/62 | HR 73 | Temp 98.3°F | Resp 18 | Ht 68.0 in | Wt 213.8 lb

## 2023-03-11 DIAGNOSIS — Z86718 Personal history of other venous thrombosis and embolism: Secondary | ICD-10-CM | POA: Diagnosis not present

## 2023-03-11 DIAGNOSIS — C7B8 Other secondary neuroendocrine tumors: Secondary | ICD-10-CM | POA: Insufficient documentation

## 2023-03-11 DIAGNOSIS — M549 Dorsalgia, unspecified: Secondary | ICD-10-CM | POA: Diagnosis not present

## 2023-03-11 DIAGNOSIS — K5909 Other constipation: Secondary | ICD-10-CM | POA: Diagnosis not present

## 2023-03-11 DIAGNOSIS — G20A1 Parkinson's disease without dyskinesia, without mention of fluctuations: Secondary | ICD-10-CM | POA: Insufficient documentation

## 2023-03-11 DIAGNOSIS — Z8673 Personal history of transient ischemic attack (TIA), and cerebral infarction without residual deficits: Secondary | ICD-10-CM | POA: Diagnosis not present

## 2023-03-11 DIAGNOSIS — Z79899 Other long term (current) drug therapy: Secondary | ICD-10-CM | POA: Diagnosis not present

## 2023-03-11 DIAGNOSIS — K769 Liver disease, unspecified: Secondary | ICD-10-CM

## 2023-03-11 DIAGNOSIS — E1159 Type 2 diabetes mellitus with other circulatory complications: Secondary | ICD-10-CM | POA: Diagnosis not present

## 2023-03-11 DIAGNOSIS — C7A8 Other malignant neuroendocrine tumors: Secondary | ICD-10-CM | POA: Insufficient documentation

## 2023-03-11 DIAGNOSIS — I10 Essential (primary) hypertension: Secondary | ICD-10-CM | POA: Diagnosis not present

## 2023-03-11 LAB — COMPREHENSIVE METABOLIC PANEL
ALT: 5 U/L (ref 0–44)
AST: 19 U/L (ref 15–41)
Albumin: 3.9 g/dL (ref 3.5–5.0)
Alkaline Phosphatase: 70 U/L (ref 38–126)
Anion gap: 9 (ref 5–15)
BUN: 16 mg/dL (ref 8–23)
CO2: 27 mmol/L (ref 22–32)
Calcium: 9.4 mg/dL (ref 8.9–10.3)
Chloride: 105 mmol/L (ref 98–111)
Creatinine, Ser: 1.01 mg/dL (ref 0.61–1.24)
GFR, Estimated: >60 mL/min (ref >60)
Glucose, Bld: 160 mg/dL — ABNORMAL HIGH (ref 70–99)
Potassium: 3.8 mmol/L (ref 3.5–5.1)
Sodium: 141 mmol/L (ref 135–145)
Total Bilirubin: 0.4 mg/dL (ref 0.3–1.2)
Total Protein: 7.2 g/dL (ref 6.5–8.1)

## 2023-03-11 LAB — CBC WITH DIFFERENTIAL/PLATELET
Abs Immature Granulocytes: 0.03 10*3/uL (ref 0.00–0.07)
Basophils Absolute: 0.1 10*3/uL (ref 0.0–0.1)
Basophils Relative: 1 %
Eosinophils Absolute: 0.4 10*3/uL (ref 0.0–0.5)
Eosinophils Relative: 4 %
HCT: 38.5 % — ABNORMAL LOW (ref 39.0–52.0)
Hemoglobin: 12.8 g/dL — ABNORMAL LOW (ref 13.0–17.0)
Immature Granulocytes: 0 %
Lymphocytes Relative: 33 %
Lymphs Abs: 2.9 10*3/uL (ref 0.7–4.0)
MCH: 30.1 pg (ref 26.0–34.0)
MCHC: 33.2 g/dL (ref 30.0–36.0)
MCV: 90.6 fL (ref 80.0–100.0)
Monocytes Absolute: 0.8 10*3/uL (ref 0.1–1.0)
Monocytes Relative: 9 %
Neutro Abs: 4.7 10*3/uL (ref 1.7–7.7)
Neutrophils Relative %: 53 %
Platelets: 254 10*3/uL (ref 150–400)
RBC: 4.25 MIL/uL (ref 4.22–5.81)
RDW: 13.7 % (ref 11.5–15.5)
WBC: 8.8 10*3/uL (ref 4.0–10.5)
nRBC: 0 % (ref 0.0–0.2)

## 2023-03-11 NOTE — Progress Notes (Signed)
n

## 2023-03-11 NOTE — Progress Notes (Signed)
[facility]   [phone] Fax:([phone]) [phone]   Clinic Follow up Note   Patient Care Team: Johny Blamer, MD as PCP - General (Family Medicine) Johny Blamer, MD (Family Medicine) Malachy Mood, MD as Consulting Physician (Oncology)  Date of Service:  03/11/2023  CHIEF COMPLAINT: f/u of metastatic NET   CURRENT THERAPY:  Observation  ASSESSMENT:  Jordan [name] is a 82 y.o. 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 CT AP on 06/17/22 showed: calcified mesenteric mass with suspected associated small bowel mass; suspicious right hepatic lesion; nodular enhancement along wall of bowel loop in central abdomen.  -abdomen MRI 06/23/22 showed: 3.9 cm mass in right hepatic lobe; small RLQ mesenteric mass or lymphadenopathy. 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 -PET DOTATATE from 08/12/22 showed avid disease within the liver, mesentery and small bowel. 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  He reports worsening hyperglycemia, and decreased appetite, which are likely related to the injections.  So we stopped after last injection in 11/2022 -He is clinically stable no increased diarrhea, flushing, or abdominal cramps since he came off the injection.  Continue clinical observation. -I also reviewed the other treatment options, such as Afinitor or Lutathera, if he has significant disease progression.   2.  Stroke, Parkinson disease, hypertension, diabetes -Continue medical treatment, follow-up with his primary care physician and neurologist.   3. DVT of LLE -Found in June 2023, related to his underlying malignancy and limited mobility -Recommend anticoagulation lifelong, at 15 mg daily due to history of intermittent hematuria.   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   EXAM: CT ABDOMEN AND PELVIS WITH CONTRAST  IMPRESSION: 1. 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 2. Hepatic lesion as discussed above in the posterior RIGHT hemiliver is suspicious for metastasis in this context. For all above findings in lieu of hepatic MRI, if the patient is not able to undergo hepatic MRI, biochemical correlation and DOTATATE PET may be helpful. 3. Bowel loop in the central abdomen with nodular enhancement approximately 8-9 mm along the wall in terms of greatest wall thickness. 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 4. Hepatic steatosis. 5. Resolution of inflammation about the pancreatic tail with nodular appearance of pancreatic tail. Attention on subsequent imaging is suggested. This may reflect sequela of prior inflammation given improvement but underlying lesion in this location while not favored is not excluded.  ADDENDUM: For clarification, the central, ileal mesenteric lesion currently measuring 2.1 x 2.1 x 2.4 cm measured 1.6 x 1.8 x 2.1 cm in 2015.   Faintly visible area in the posterior RIGHT hemiliver in 2015 raising the question of possibility of small lesion at best  2 x 2.2 cm in the posterior RIGHT hemiliver this areas now clearly visible measuring 4.1 x 4.0 cm. Findings are  compatible with indolent behavior. Indolent carcinoid remains the primary differential consideration. Tumor marker correlation is suggested with DOTATATE PET for further evaluation as warranted.   06/23/2022 Imaging   EXAM: MRI ABDOMEN WITHOUT AND WITH CONTRAST  IMPRESSION: 3.9 cm heterogeneous enhancing mass in segment 7 of the right hepatic lobe, which is nonspecific. Hepatic metastasis cannot be excluded.   Small benign hemangioma and cysts in the posterior right hepatic lobe.   Small right lower quadrant mesenteric mass or lymphadenopathy, without significant change.   Stable 2.1 cm cystic lesion in the pancreatic neck, with other tiny sub-cm cystic foci in the pancreatic head and body. Differential diagnosis includes pancreatic pseudocysts and indolent IPMNs. Recommend continued follow-up by MRI in 6 months. This recommendation follows ACR consensus guidelines: Management of Incidental Pancreatic Cysts: A White Paper of the ACR Incidental Findings Committee. J Am Coll Radiol 2017;14:911-923.   7 mm calculus in the left renal pelvis, with mild left pelvicaliectasis.   07/14/2022 Initial Biopsy   FINAL MICROSCOPIC DIAGNOSIS:   A. LIVER, RIGHT, NEEDLE CORE BIOPSY:  -  Metastatic low-grade neuroendocrine tumor (positive for chromogranin and synaptophysin).  -  Proliferation rate by Ki-67 less than 3%.    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 He presents to the clinic with family. Patient states he has excessive back pain. He states he has chronic constipation.    All other systems were reviewed with the patient and are negative.  MEDICAL HISTORY:  Past  Medical History:  Diagnosis Date   Arthritis    R shoulder, bone spur   Arthritis    "hands" (03/25/2016)   Chronic diastolic heart failure (HCC)    Chronic lower back pain    Coronary heart disease    Dr Garnette Scheuermann   Diverticulitis 12/2021   DVT (deep venous thrombosis) (HCC) 05/2022   left leg   H/O cardiovascular stress test    perhaps last one was 2009   Heart murmur    12/29/11 echo: mild MR, no AS, trivial TR   History of gout    Hyperlipidemia    Hypertension    saw Dr. Mendel Ryder last earl;y- 2014, cardiac cath. last done ?2009, blocks seen didn't require any intervention at that point.   Narcolepsy    [name] 04-11-97; Mean Latency 1.48min, SOREM 2   OSA on CPAP    NPSG 12-13-98 AHI 22.7   PONV (postoperative nausea and vomiting)    Presence of permanent cardiac pacemaker    Shortness of breath    with exertion    Stroke (HCC)    Type II diabetes mellitus (HCC)     SURGICAL HISTORY: Past Surgical History:  Procedure Laterality Date   CARDIAC CATHETERIZATION  2009?   EP IMPLANTABLE DEVICE N/A 03/25/2016   Procedure: Pacemaker Implant - Dual Chamber;  Surgeon: Marinus Maw, MD;  Location[facility] INVASIVE CV LAB;  Service: Cardiovascular;  Laterality: N/A;   FRACTURE SURGERY     HIP FRACTURE SURGERY  INSERT / REPLACE / REMOVE PACEMAKER  03/24/2016   INTRAMEDULLARY (IM) NAIL INTERTROCHANTERIC Left 02/08/2022   Procedure: INTRAMEDULLARY (IM) NAIL INTERTROCHANTRIC;  Surgeon: Cammy Copa, MD;  Location: [facility] OR;  Service: Orthopedics;  Laterality: Left;   IR KYPHO LUMBAR INC FX REDUCE BONE BX UNI/BIL CANNULATION INC/IMAGING  12/29/2021   IR RADIOLOGIST EVAL & MGMT  12/15/2021   IR RADIOLOGY PERIPHERAL GUIDED IV START  07/14/2022   IR US GUIDE VASC ACCESS RIGHT  07/15/2022   LEFT AND RIGHT HEART CATHETERIZATION WITH CORONARY ANGIOGRAM N/A 06/05/2014   Procedure: LEFT AND RIGHT HEART CATHETERIZATION WITH CORONARY ANGIOGRAM;  Surgeon: Lesleigh Noe, MD;  Location: [facility]  CATH LAB;  Service: Cardiovascular;  Laterality: N/A;   NASAL FRACTURE SURGERY     SHOULDER ARTHROSCOPY WITH ROTATOR CUFF REPAIR AND SUBACROMIAL DECOMPRESSION Right 08/16/2013   Procedure: SHOULDER ARTHROSCOPY WITH ROTATOR CUFF REPAIR AND SUBACROMIAL DECOMPRESSION;  Surgeon: Mable Paris, MD;  Location: [facility] OR;  Service: Orthopedics;  Laterality: Right;  Right shoulder arthroscopy rotator cuff repair, subacromial decompression.   TONSILLECTOMY      I have reviewed the social history and family history with the patient and they are unchanged from previous note.  ALLERGIES:  is allergic to amantadines, clonazepam, and zoloft [sertraline].  MEDICATIONS:  Current Outpatient Medications  Medication Sig Dispense Refill   acetaminophen (TYLENOL) 500 MG tablet Take 500-1,000 mg by mouth every 6 (six) hours as needed for moderate pain or mild pain.     atorvastatin (LIPITOR) 40 MG tablet Take 0.5 tablets (20 mg total) by mouth daily. 30 tablet 0   bifidobacterium infantis (ALIGN) capsule Take 1 capsule by mouth daily.     Camphor-Menthol-Methyl Sal (HM SALONPAS PAIN RELIEF EX) Apply 1 patch topically daily as needed (Back pain).     carbidopa-levodopa (SINEMET CR) 50-200 MG tablet TAKE 1 TABLET BY MOUTH EVERYDAY AT BEDTIME 90 tablet 1   carbidopa-levodopa (SINEMET IR) 25-100 MG tablet Take 1.5 tablets by mouth 3 (three) times daily. 180 tablet 2   cholecalciferol (VITAMIN D3) 25 MCG (1000 UNIT) tablet Take 1,000 Units by mouth daily.     clotrimazole-betamethasone (LOTRISONE) cream Apply 1 Application topically daily as needed (Rash).     docusate sodium (COLACE) 100 MG capsule Take 1 capsule (100 mg total) by mouth 2 (two) times daily. (Patient taking differently: Take 100 mg by mouth daily as needed for mild constipation or moderate constipation. As Needed) 10 capsule 0   feeding supplement, GLUCERNA SHAKE, (GLUCERNA SHAKE) LIQD Take 237 mLs by mouth 2 (two) times daily between meals.  Alternate with ensure Diabetic     furosemide (LASIX) 20 MG tablet USE AS NEEDED FOR UP TO 5 DAYS IF SWELLING IN LEGS RECURS. CONTACT CARDIOLOGY IS SWELLING DOES NOT IMPROVE OR GETS WORSE. 15 tablet 0   lidocaine (LIDODERM) 5 % Place 1 patch onto the skin daily as needed. Purchase over the counter. On for 12 hours and off for 12 hours 30 patch 0   magnesium gluconate (MAGONATE) 500 MG tablet Take 0.5 tablets (250 mg total) by mouth at bedtime. 30 tablet 0   Melatonin 10 MG TABS Take 10 mg by mouth at bedtime.     metFORMIN (GLUCOPHAGE) 1000 MG tablet Take 1 tablet (1,000 mg total) by mouth daily with breakfast. (Patient taking differently: Take 2,000 mg by mouth 2 (two) times daily.) 30 tablet 0   metoprolol tartrate (LOPRESSOR) 25 MG tablet Take 1 tablet (25 mg total) by mouth  2 (two) times daily. (Patient taking differently: Take 12.5 mg by mouth 2 (two) times daily.) 60 tablet 0   modafinil (PROVIGIL) 200 MG tablet Take 1 tablet (200 mg total) by mouth daily. 30 tablet 5   Multiple Vitamin (MULTIVITAMIN WITH MINERALS) TABS tablet Take 1 tablet by mouth daily.     NITROSTAT 0.4 MG SL tablet PLACE 1 TABLET (0.4 MG TOTAL) UNDER THE TONGUE EVERY 5 (FIVE) MINUTES AS NEEDED FOR CHEST PAIN. (Patient taking differently: Place 0.4 mg under the tongue every 5 (five) minutes as needed for chest pain.) 25 tablet 5   Omega-3 Fatty Acids (OMEGA 3 PO) Take 1,280 mg by mouth daily.     polyethylene glycol (MIRALAX / GLYCOLAX) 17 g packet Take 17 g by mouth daily. 14 each 0   polyvinyl alcohol (LIQUIFILM TEARS) 1.4 % ophthalmic solution Place 1 drop into both eyes as needed for dry eyes. (Patient taking differently: Place 1 drop into both eyes daily as needed for dry eyes.) 15 mL 0   QUEtiapine (SEROQUEL) 25 MG tablet TAKE 1/2 TABLETS (12.5 MG TOTAL) BY MOUTH AT BEDTIME. 45 tablet 1   sitaGLIPtin (JANUVIA) 50 MG tablet Take 50 mg by mouth daily.     Vitamin D, Ergocalciferol, (DRISDOL) 1.25 MG (50000 UNIT) CAPS  capsule TAKE 1 CAPSULE (50,000 UNITS TOTAL) BY MOUTH EVERY 7 (SEVEN) DAYS 5 capsule 0   XARELTO 15 MG TABS tablet TAKE 1 TABLET (15 MG TOTAL) BY MOUTH DAILY WITH SUPPER 90 tablet 1   No current facility-administered medications for this visit.    PHYSICAL EXAMINATION: ECOG PERFORMANCE STATUS: 2 - Symptomatic, <50% confined to bed  Vitals:   03/11/23 1213  BP: 104/62  Pulse: 73  Resp: 18  Temp: 98.3 F (36.8 C)  SpO2: 95%   Wt Readings from Last 3 Encounters:  03/11/23 213 lb 12.8 oz (97 kg)  01/14/23 213 lb 6.4 oz (96.8 kg)  12/17/22 217 lb 9 oz (98.7 kg)     GENERAL:alert, no distress and comfortable SKIN: skin color, texture, turgor are normal, no rashes or significant lesions EYES: normal, Conjunctiva are pink and non-injected, sclera clear NECK: supple, thyroid normal size, non-tender, without nodularity LYMPH:  no palpable lymphadenopathy in the cervical, axillary  LUNGS: clear to auscultation and percussion with normal breathing effort HEART: regular rate & rhythm and no murmurs and no lower extremity edema ABDOMEN:abdomen soft, non-tender and normal bowel sounds Musculoskeletal:no cyanosis of digits and no clubbing  NEURO: alert & oriented x 3 with fluent speech, no focal motor/sensory deficits  LABORATORY DATA:  I have reviewed the data as listed    Latest Ref Rng & Units 03/11/2023   11:58 AM 12/17/2022   12:52 PM 09/17/2022    1:49 PM  CBC  WBC 4.0 - 10.5 K/uL 8.8  10.7  10.4   Hemoglobin 13.0 - 17.0 g/dL 96.2  95.2  84.1   Hematocrit 39.0 - 52.0 % 38.5  39.0  42.6   Platelets 150 - 400 K/uL 254  281  257         Latest Ref Rng & Units 03/11/2023   11:58 AM 12/17/2022   12:52 PM 09/17/2022    1:49 PM  CMP  Glucose 70 - 99 mg/dL 324  401  027   BUN 8 - 23 mg/dL 16  24  27    Creatinine 0.61 - 1.24 mg/dL 2.53  6.64  4.03   Sodium 135 - 145 mmol/L 141  138  137  Potassium 3.5 - 5.1 mmol/L 3.8  4.3  4.3   Chloride 98 - 111 mmol/L 105  103  102   CO2 22  - 32 mmol/L 27  23  24    Calcium 8.9 - 10.3 mg/dL 9.4  9.2  9.9   Total Protein 6.5 - 8.1 g/dL 7.2  7.0  7.5   Total Bilirubin 0.3 - 1.2 mg/dL 0.4  0.5  0.5   Alkaline Phos 38 - 126 U/L 70  79  76   AST 15 - 41 U/L 19  14  20    ALT 0 - 44 U/L 5  8  8        RADIOGRAPHIC STUDIES: I have personally reviewed the radiological images as listed and agreed with the findings in the report. No results found.    No orders of the defined types were placed in this encounter.  All questions were answered. The patient knows to call the clinic with any problems, questions or concerns. No barriers to learning was detected. The total time spent in the appointment was 25 minutes.     Malachy Mood, MD 03/11/2023   I, Sharlette Dense, CMA, am acting as scribe for Malachy Mood, MD.   I have reviewed the above documentation for accuracy and completeness, and I agree with the above.

## 2023-03-13 ENCOUNTER — Other Ambulatory Visit: Payer: Self-pay | Admitting: Neurology

## 2023-03-13 ENCOUNTER — Encounter: Payer: Self-pay | Admitting: Neurology

## 2023-03-14 ENCOUNTER — Other Ambulatory Visit: Payer: Self-pay

## 2023-03-14 DIAGNOSIS — M9903 Segmental and somatic dysfunction of lumbar region: Secondary | ICD-10-CM | POA: Diagnosis not present

## 2023-03-14 DIAGNOSIS — M9904 Segmental and somatic dysfunction of sacral region: Secondary | ICD-10-CM | POA: Diagnosis not present

## 2023-03-14 DIAGNOSIS — M9905 Segmental and somatic dysfunction of pelvic region: Secondary | ICD-10-CM | POA: Diagnosis not present

## 2023-03-14 DIAGNOSIS — M5136 Other intervertebral disc degeneration, lumbar region: Secondary | ICD-10-CM | POA: Diagnosis not present

## 2023-03-14 MED ORDER — CARBIDOPA-LEVODOPA 25-100 MG PO TABS: ORAL_TABLET | ORAL | 0 refills | Status: DC

## 2023-03-15 ENCOUNTER — Ambulatory Visit: Payer: Medicare (Managed Care) | Admitting: Speech Pathology

## 2023-03-15 ENCOUNTER — Ambulatory Visit: Payer: Medicare (Managed Care) | Admitting: Occupational Therapy

## 2023-03-15 LAB — CHROMOGRANIN A: Chromogranin A (ng/mL): 86.5 ng/mL (ref 0.0–101.8)

## 2023-03-17 ENCOUNTER — Ambulatory Visit: Payer: Medicare (Managed Care) | Admitting: Occupational Therapy

## 2023-03-17 DIAGNOSIS — D3A8 Other benign neuroendocrine tumors: Secondary | ICD-10-CM | POA: Diagnosis not present

## 2023-03-17 DIAGNOSIS — E7800 Pure hypercholesterolemia, unspecified: Secondary | ICD-10-CM | POA: Diagnosis not present

## 2023-03-17 DIAGNOSIS — G4730 Sleep apnea, unspecified: Secondary | ICD-10-CM | POA: Diagnosis not present

## 2023-03-17 DIAGNOSIS — M6281 Muscle weakness (generalized): Secondary | ICD-10-CM

## 2023-03-17 DIAGNOSIS — Z950 Presence of cardiac pacemaker: Secondary | ICD-10-CM | POA: Diagnosis not present

## 2023-03-17 DIAGNOSIS — R251 Tremor, unspecified: Secondary | ICD-10-CM

## 2023-03-17 DIAGNOSIS — R278 Other lack of coordination: Secondary | ICD-10-CM

## 2023-03-17 DIAGNOSIS — E559 Vitamin D deficiency, unspecified: Secondary | ICD-10-CM | POA: Diagnosis not present

## 2023-03-17 DIAGNOSIS — E119 Type 2 diabetes mellitus without complications: Secondary | ICD-10-CM | POA: Diagnosis not present

## 2023-03-17 DIAGNOSIS — G47419 Narcolepsy without cataplexy: Secondary | ICD-10-CM | POA: Diagnosis not present

## 2023-03-17 DIAGNOSIS — I679 Cerebrovascular disease, unspecified: Secondary | ICD-10-CM | POA: Diagnosis not present

## 2023-03-17 DIAGNOSIS — R41840 Attention and concentration deficit: Secondary | ICD-10-CM

## 2023-03-17 DIAGNOSIS — G214 Vascular parkinsonism: Secondary | ICD-10-CM | POA: Diagnosis not present

## 2023-03-17 DIAGNOSIS — I110 Hypertensive heart disease with heart failure: Secondary | ICD-10-CM | POA: Diagnosis not present

## 2023-03-17 DIAGNOSIS — I5032 Chronic diastolic (congestive) heart failure: Secondary | ICD-10-CM | POA: Diagnosis not present

## 2023-03-17 DIAGNOSIS — E1169 Type 2 diabetes mellitus with other specified complication: Secondary | ICD-10-CM | POA: Diagnosis not present

## 2023-03-17 NOTE — Therapy (Signed)
OUTPATIENT OCCUPATIONAL THERAPY [facility]  Patient Name: Jordan [name] MRN: [id number] DOB:July 29, 1941, 82 y.o., male Today's Date: 03/17/2023  PCP: Jordan Blamer, MD  REFERRING PROVIDER: Horton Chin, MD  END OF SESSION:  OT End of Session - 03/17/23 1458     Visit Number 3    Number of Visits 21    Date for OT Re-Evaluation 04/22/23    Authorization Type [employer] - [place], no auth required    Progress Note Due on Visit 10    OT Start Time 1453    OT Stop Time 1531    OT Time Calculation (min) 38 min    Activity Tolerance Patient tolerated treatment well    Behavior During Therapy WFL for tasks assessed/performed              Past Medical History:  Diagnosis Date   Arthritis    R shoulder, bone spur   Arthritis    "hands" (03/25/2016)   Chronic diastolic heart failure (HCC)    Chronic lower back pain    Coronary heart disease    Dr Jordan [name]   Diverticulitis 12/2021   DVT (deep venous thrombosis) (HCC) 05/2022   left leg   H/O cardiovascular stress test    perhaps last one was 2009   Heart murmur    12/29/11 echo: mild MR, no AS, trivial TR   History of gout    Hyperlipidemia    Hypertension    saw Dr. Mendel [name] last earl;y- 2014, cardiac cath. last done ?2009, blocks seen didn't require any intervention at that point.   Narcolepsy    [name] 04-11-97; Mean Latency 1.88min, SOREM 2   OSA on CPAP    NPSG 12-13-98 AHI 22.7   PONV (postoperative nausea and vomiting)    Presence of permanent cardiac pacemaker    Shortness of breath    with exertion    Stroke (HCC)    Type II diabetes mellitus (HCC)    Past Surgical History:  Procedure Laterality Date   CARDIAC CATHETERIZATION  2009?   EP IMPLANTABLE DEVICE N/A 03/25/2016   Procedure: Pacemaker Implant - Dual Chamber;  Surgeon: Jordan Maw, MD;  Location: [facility] INVASIVE CV LAB;  Service: Cardiovascular;  Laterality: N/A;   FRACTURE SURGERY     HIP FRACTURE SURGERY     INSERT / REPLACE /  REMOVE PACEMAKER  03/24/2016   INTRAMEDULLARY (IM) NAIL INTERTROCHANTERIC Left 02/08/2022   Procedure: INTRAMEDULLARY (IM) NAIL INTERTROCHANTRIC;  Surgeon: Jordan Copa, MD;  Location: [facility] OR;  Service: Orthopedics;  Laterality: Left;   IR KYPHO LUMBAR INC FX REDUCE BONE BX UNI/BIL CANNULATION INC/IMAGING  12/29/2021   IR RADIOLOGIST EVAL & MGMT  12/15/2021   IR RADIOLOGY PERIPHERAL GUIDED IV START  07/14/2022   IR US GUIDE VASC ACCESS RIGHT  07/15/2022   LEFT AND RIGHT HEART CATHETERIZATION WITH CORONARY ANGIOGRAM N/A 06/05/2014   Procedure: LEFT AND RIGHT HEART CATHETERIZATION WITH CORONARY ANGIOGRAM;  Surgeon: Jordan Noe, MD;  Location: [facility] CATH LAB;  Service: Cardiovascular;  Laterality: N/A;   NASAL FRACTURE SURGERY     SHOULDER ARTHROSCOPY WITH ROTATOR CUFF REPAIR AND SUBACROMIAL DECOMPRESSION Right 08/16/2013   Procedure: SHOULDER ARTHROSCOPY WITH ROTATOR CUFF REPAIR AND SUBACROMIAL DECOMPRESSION;  Surgeon: Jordan Paris, MD;  Location: [facility] OR;  Service: Orthopedics;  Laterality: Right;  Right shoulder arthroscopy rotator cuff repair, subacromial decompression.   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   Acute deep vein thrombosis (DVT) of left lower extremity (HCC) 04/29/2022   Closed intertrochanteric fracture of left hip, initial encounter [facility])    Closed left hip fracture (HCC) secondary to fall at home 02/08/2022   Paroxysmal atrial fibrillation (HCC) 02/08/2022   Debility 01/08/2022   Constipation 01/07/2022   Physical deconditioning 01/07/2022   Acute metabolic encephalopathy 01/04/2022   Hypomagnesemia 01/04/2022   Pancreatitis 12/30/2021   Parkinson's disease 12/21/2021   Parkinson disease 12/18/2021   Frequent falls 12/18/2021   History of CVA (cerebrovascular accident) 12/18/2021   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   Hallucinations, visual  07/30/2021   Acetabulum fracture, left (HCC) 06/25/2021   Acute hip pain, left 06/24/2021   Fall at home, initial encounter 06/24/2021   Leukocytosis 06/24/2021   Chronic diastolic heart failure (HCC)    Sinusitis, acute maxillary 06/24/2020   AKI (acute kidney injury) (HCC)    Labile blood glucose    Cough    Benign essential HTN    Skin lesion of hand    Hypoalbuminemia due to protein-calorie malnutrition (HCC)    Controlled type 2 diabetes mellitus with hyperglycemia, without long-term current use of insulin (HCC)    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   Morbid obesity (HCC)    Acute blood loss anemia    Acute thalamic infarction (HCC) 04/15/2020   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   TIA (transient ischemic attack) 02/17/2018   Type 2 diabetes mellitus with hyperlipidemia (HCC) 02/17/2018   Insomnia 02/01/2018   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   Chronotropic incompetence 07/09/2014   Dyspnea on exertion 03/03/2014   HLD (hyperlipidemia) 12/13/2007   Essential hypertension 12/13/2007   Class 1 obesity 12/05/2007   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   Pt accompanied by:  wife, Jordan [name]  PERTINENT HISTORY: "[name] is an 82 year old man who presents for follow-up after hip fracture.    1) Left hip fracture:  -not walking much, but can walk with rolling walker I condominium unit.  -his wife  accompanies him today and provides most of history -his walking discharge is very short.  -no longer painful -has weakness in bilateral lower extremities -no more falls.    2) Parkinson's: -sometimes muscles don't cooperate the way they should. -he has vascular parkinsonism -has stooped posture   3) Right leg weakness: -when walking.  -wife is nervous about him falling -he was in skilled rehab and was discharged June 8th  -they have not yet set up outpatient therapy -he has not had any professional therapy since discharge on June 8th.    4) 6 bottom teeth removed -has more of a challenge in forming words.    5) Neuroendocrine tumor -being treated with monthly octreotide -no current symptoms currently -was having persistent abdominal pain- they thought it was a flare of diverticulitis but this was not present -CBGs increase because of octreotide- CBGs as high as 250/260 -he asks for icecream   He was  diagnosed with a vascular  Parkinsonism in Jan 2022 and has history of CVA in June 2021 per pt report and spouse. Pt fell and sustained hip fx on 02/08/22 and pt underwent left  IM nailing.  04/30/22:  hospitalized with blood clot LLE--changed meds and cleared to resume OT/PT."  PRECAUTIONS: Fall  WEIGHT BEARING RESTRICTIONS: No  PAIN:  Are you having pain? No  FALLS: Has patient fallen in last 6 months? No  Lives with: lives with their spouse Lives in: Other Condo Stairs:  On the 10th floor, there are 2 elevators  Has following equipment at home: Dan Humphreys - 2 wheeled, shower chair, Grab bars, and transport chair   PLOF: Independent with household mobility with device and Needs assistance with ADLs Wife helps with ADLs, pt can get dressed and bathe himself it just takes longer. "Socks take forever"   PATIENT GOALS: Improve UE functional use  OBJECTIVE:   HAND DOMINANCE: Right  ADLs: Overall ADLs: min to moderate assistance (requires more assistance due to time  constraints)  IADLs: Handwriting: 25% legible, Increased time, and Severe micrographia  MOBILITY STATUS: Needs Assist: uses transport chair, dependent for navigation  ACTIVITY TOLERANCE: Activity tolerance: fair  FUNCTIONAL OUTCOME MEASURES: PSFS:  COORDINATION: Box and Blocks:  Right 31 blocks, Left 34 blocks  UE ROM:  WFL  UE MMT:   WFL  SENSATION: Reports paresthesias in R hand all fingers  MUSCLE TONE: not fully tested this visit  COGNITION: Overall cognitive status: Impaired  OBSERVATIONS: Bradykinesia and Postural tremors   TODAY'S TREATMENT:                                                                                                                               OT reviewed pen tricks as noted in patient instructions for improved coordination of BUE.   Patient required min cueing for proper completion.   OT reviewed Parkinson's HEP from prior episode. Pt placed into folder for greater accessibility. Reprinted seated, large amplitude movements.   PATIENT EDUCATION: Education details: HEP review Person educated: Patient and Spouse Education method: Explanation, Demonstration, and Handouts Education comprehension: verbalized understanding, returned demonstration, and needs further education  HOME EXERCISE PROGRAM: 5/9: pen tricks  GOALS:  SHORT TERM GOALS: Target date: 03/11/2023    Pt will be independent with PD specific HEP.  Baseline: not yet initiated Goal status: INITIAL  2.  Pt will verbalize understanding of adapted strategies to maximize safety and independence with ADLs/IADLs.  Baseline: not yet initiated Goal status: INITIAL  3.  Pt will write a sentence with no significant decrease in size and maintain 50% legibility.  Baseline: 25% legibility Goal status: INITIAL  4.  Will assess 9 hole peg and set appropriate LTG.  Baseline:  Goal status: INITIAL  5.  Will assess 3 button/button time and set appropriate LTG.  Baseline:  Goal  status: INITIAL  6.  Will assess PPT#4 (don/doff jacket and set appropriate goal) Baseline:  Goal status: INITIAL  LONG TERM GOALS: Target date: 04/22/2023    Pt will be able to place at least 35 blocks using each hand with completion of Box and Blocks test. Baseline: Right 31 blocks, Left 34 blocks Goal status: INITIAL  2.  Patient will report at least two-point increase in average PSFS score or at least three-point increase in a single activity score indicating functionally significant improvement given minimum detectable change. Baseline: 3 total score (see above for single activity scores) Goal status: INITIAL  3.  Pt will verbalize understanding of ways to prevent future PD related complications and PD community resources.  Baseline: not yet initiated Goal status: INITIAL  4.  Pt will write a sentence with no significant decrease in size and maintain 75% legibility.  Baseline: 25% legibility Goal status: INITIAL   ASSESSMENT:  CLINICAL IMPRESSION: Pt demonstrates improved carry-over of HEP from previous session as needed to progress towards goals.  PERFORMANCE DEFICITS: in functional skills including ADLs, IADLs, coordination, sensation, strength, pain, Fine motor control, Gross motor control, mobility, balance, endurance, decreased knowledge of use of DME, and UE functional use, cognitive skills including attention, memory, problem solving, and safety awareness.  IMPAIRMENTS: are limiting patient from ADLs, IADLs, rest and sleep, leisure, and social participation.   COMORBIDITIES:  may have co-morbidities  that affects occupational performance. Patient will benefit from skilled OT to address above impairments and improve overall function.  REHAB POTENTIAL: Fair given PD prognosis   PLAN:  OT FREQUENCY: 2x/week  OT DURATION: 10 weeks  PLANNED INTERVENTIONS: self care/ADL training, therapeutic exercise, therapeutic activity, manual therapy, passive range of motion,  balance training, functional mobility training, splinting, ultrasound, paraffin, fluidotherapy, moist heat, patient/family education, cognitive remediation/compensation, energy conservation, coping strategies training, DME and/or AE instructions, and Re-evaluation  RECOMMENDED OTHER SERVICES: none at this time  CONSULTED AND AGREED WITH PLAN OF CARE: Patient and family member/caregiver  PLAN FOR NEXT SESSION: Further review HEP from previous therapy episode   Delana Meyer, OT 03/17/2023, 3:06 PM

## 2023-03-22 ENCOUNTER — Ambulatory Visit: Payer: Medicare (Managed Care) | Admitting: Occupational Therapy

## 2023-03-22 ENCOUNTER — Encounter: Payer: Self-pay | Admitting: Occupational Therapy

## 2023-03-22 DIAGNOSIS — R278 Other lack of coordination: Secondary | ICD-10-CM

## 2023-03-22 DIAGNOSIS — R293 Abnormal posture: Secondary | ICD-10-CM

## 2023-03-22 DIAGNOSIS — M6281 Muscle weakness (generalized): Secondary | ICD-10-CM | POA: Diagnosis not present

## 2023-03-22 DIAGNOSIS — R251 Tremor, unspecified: Secondary | ICD-10-CM

## 2023-03-22 NOTE — Therapy (Signed)
OUTPATIENT OCCUPATIONAL THERAPY [facility]  Patient Name: Jordan [name] MRN: [id number] DOB:1941-07-17, 82 y.o., male Today's Date: 03/22/2023  PCP: Johny Blamer, MD  REFERRING PROVIDER: Horton Chin, MD  END OF SESSION:  OT End of Session - 03/22/23 1320     Visit Number 4    Number of Visits 21    Date for OT Re-Evaluation 04/22/23    Authorization Type [employer] - [place], no auth required    Progress Note Due on Visit 10    OT Start Time 1318    OT Stop Time 1405    OT Time Calculation (min) 47 min    Activity Tolerance Patient tolerated treatment well    Behavior During Therapy WFL for tasks assessed/performed              Past Medical History:  Diagnosis Date   Arthritis    R shoulder, bone spur   Arthritis    "hands" (03/25/2016)   Chronic diastolic heart failure (HCC)    Chronic lower back pain    Coronary heart disease    Dr Garnette Scheuermann   Diverticulitis 12/2021   DVT (deep venous thrombosis) (HCC) 05/2022   left leg   H/O cardiovascular stress test    perhaps last one was 2009   Heart murmur    12/29/11 echo: mild MR, no AS, trivial TR   History of gout    Hyperlipidemia    Hypertension    saw Dr. Mendel Ryder last earl;y- 2014, cardiac cath. last done ?2009, blocks seen didn't require any intervention at that point.   Narcolepsy    [name] 04-11-97; Mean Latency 1.47min, SOREM 2   OSA on CPAP    NPSG 12-13-98 AHI 22.7   PONV (postoperative nausea and vomiting)    Presence of permanent cardiac pacemaker    Shortness of breath    with exertion    Stroke (HCC)    Type II diabetes mellitus (HCC)    Past Surgical History:  Procedure Laterality Date   CARDIAC CATHETERIZATION  2009?   EP IMPLANTABLE DEVICE N/A 03/25/2016   Procedure: Pacemaker Implant - Dual Chamber;  Surgeon: Marinus Maw, MD;  Location: [facility] INVASIVE CV LAB;  Service: Cardiovascular;  Laterality: N/A;   FRACTURE SURGERY     HIP FRACTURE SURGERY     INSERT / REPLACE /  REMOVE PACEMAKER  03/24/2016   INTRAMEDULLARY (IM) NAIL INTERTROCHANTERIC Left 02/08/2022   Procedure: INTRAMEDULLARY (IM) NAIL INTERTROCHANTRIC;  Surgeon: Cammy Copa, MD;  Location: [facility] OR;  Service: Orthopedics;  Laterality: Left;   IR KYPHO LUMBAR INC FX REDUCE BONE BX UNI/BIL CANNULATION INC/IMAGING  12/29/2021   IR RADIOLOGIST EVAL & MGMT  12/15/2021   IR RADIOLOGY PERIPHERAL GUIDED IV START  07/14/2022   IR US GUIDE VASC ACCESS RIGHT  07/15/2022   LEFT AND RIGHT HEART CATHETERIZATION WITH CORONARY ANGIOGRAM N/A 06/05/2014   Procedure: LEFT AND RIGHT HEART CATHETERIZATION WITH CORONARY ANGIOGRAM;  Surgeon: Lesleigh Noe, MD;  Location: [facility] CATH LAB;  Service: Cardiovascular;  Laterality: N/A;   NASAL FRACTURE SURGERY     SHOULDER ARTHROSCOPY WITH ROTATOR CUFF REPAIR AND SUBACROMIAL DECOMPRESSION Right 08/16/2013   Procedure: SHOULDER ARTHROSCOPY WITH ROTATOR CUFF REPAIR AND SUBACROMIAL DECOMPRESSION;  Surgeon: Mable Paris, MD;  Location: [facility] OR;  Service: Orthopedics;  Laterality: Right;  Right shoulder arthroscopy rotator cuff repair, subacromial decompression.   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   Acute deep vein thrombosis (DVT) of left lower extremity (HCC) 04/29/2022   Closed intertrochanteric fracture of left hip, initial encounter [facility])    Closed left hip fracture (HCC) secondary to fall at home 02/08/2022   Paroxysmal atrial fibrillation (HCC) 02/08/2022   Debility 01/08/2022   Constipation 01/07/2022   Physical deconditioning 01/07/2022   Acute metabolic encephalopathy 01/04/2022   Hypomagnesemia 01/04/2022   Pancreatitis 12/30/2021   Parkinson's disease 12/21/2021   Parkinson disease 12/18/2021   Frequent falls 12/18/2021   History of CVA (cerebrovascular accident) 12/18/2021   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   Hallucinations, visual  07/30/2021   Acetabulum fracture, left (HCC) 06/25/2021   Acute hip pain, left 06/24/2021   Fall at home, initial encounter 06/24/2021   Leukocytosis 06/24/2021   Chronic diastolic heart failure (HCC)    Sinusitis, acute maxillary 06/24/2020   AKI (acute kidney injury) (HCC)    Labile blood glucose    Cough    Benign essential HTN    Skin lesion of hand    Hypoalbuminemia due to protein-calorie malnutrition (HCC)    Controlled type 2 diabetes mellitus with hyperglycemia, without long-term current use of insulin (HCC)    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   Morbid obesity (HCC)    Acute blood loss anemia    Acute thalamic infarction (HCC) 04/15/2020   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   TIA (transient ischemic attack) 02/17/2018   Type 2 diabetes mellitus with hyperlipidemia (HCC) 02/17/2018   Insomnia 02/01/2018   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   Chronotropic incompetence 07/09/2014   Dyspnea on exertion 03/03/2014   HLD (hyperlipidemia) 12/13/2007   Essential hypertension 12/13/2007   Class 1 obesity 12/05/2007   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    1) Left hip fracture:  -not walking much, but can walk with rolling walker I condominium unit.  -his wife accompanies him today and provides most of history -his walking discharge is very  short.  -no longer painful -has weakness in bilateral lower extremities -no more falls.    2) Parkinson's: -sometimes muscles don't cooperate the way they should. -he has vascular parkinsonism -has stooped posture   3) Right leg weakness: -when walking.  -wife is nervous about him falling -he was in skilled rehab and was discharged June 8th  -they have not yet set up outpatient therapy -he has not had any professional therapy since discharge on June 8th.    4) 6 bottom teeth removed -has more of a challenge in forming words.    5) Neuroendocrine tumor -being treated with monthly octreotide -no current symptoms currently -was having persistent abdominal pain- they thought it was a flare of diverticulitis but this was not present -CBGs increase because of octreotide- CBGs as high as 250/260 -he asks for icecream   He was  diagnosed with a vascular Parkinsonism in Jan 2022 and has history of CVA in June 2021 per  pt report and spouse. Pt fell and sustained hip fx on 02/08/22 and pt underwent left  IM nailing.  04/30/22:  hospitalized with blood clot LLE--changed meds and cleared to resume OT/PT."  PRECAUTIONS: Fall  WEIGHT BEARING RESTRICTIONS: No  PAIN:  Are you having pain? No  FALLS: Has patient fallen in last 6 months? No  Lives with: lives with their spouse Lives in: Other Condo Stairs:  On the 10th floor, there are 2 elevators  Has following equipment at home: Dan Humphreys - 2 wheeled, shower chair, Grab bars, and transport chair   PLOF: Independent with household mobility with device and Needs assistance with ADLs Wife helps with ADLs, pt can get dressed and bathe himself it just takes longer. "Socks take forever"   PATIENT GOALS: Improve UE functional use  OBJECTIVE:   HAND DOMINANCE: Right  ADLs: Overall ADLs: min to moderate assistance (requires more assistance due to time constraints)  IADLs: Handwriting: 25% legible, Increased time, and Severe  micrographia  MOBILITY STATUS: Needs Assist: uses transport chair, dependent for navigation  ACTIVITY TOLERANCE: Activity tolerance: fair  FUNCTIONAL OUTCOME MEASURES: PSFS:  COORDINATION: Box and Blocks:  Right 31 blocks, Left 34 blocks  UE ROM:  WFL  UE MMT:   WFL  SENSATION: Reports paresthesias in R hand all fingers  MUSCLE TONE: not fully tested this visit  COGNITION: Overall cognitive status: Impaired  OBSERVATIONS: Bradykinesia and Postural tremors   TODAY'S TREATMENT:                                                                                                                               Re-printed coordination HEP from 06/2022 and reviewed each extensively.  Re-printed ex chart and pt/wife shown how to spread out ex's/activities t/o week for better carryover Re-printed memory strategies and keeping thinking skills sharp from 06/2022 and reviewed how to incorporate practically into daily activities and gave examples of how to pick cognitive activities to work on  Discussed adaptations to mouse to prevent index finger from slipping  PATIENT EDUCATION: Education details: review and reprint of coordination HEP, ex chart, memory strategies and ways to keep thinking skills sharp from 06/2022 Person educated: Patient and Spouse Education method: Explanation, Demonstration, and Handouts Education comprehension: verbalized understanding, returned demonstration, and needs further education  HOME EXERCISE PROGRAM: 5/9: pen tricks 03/22/23: review and reprint of coordination HEP, memory strategies, thinking skills, PD ex chart from 06/2022  GOALS:  SHORT TERM GOALS: Target date: 03/11/2023    Pt will be independent with PD specific HEP.  Baseline: not yet initiated Goal status: ONGOING  2.  Pt will verbalize understanding of adapted strategies to maximize safety and independence with ADLs/IADLs.  Baseline: not yet initiated Goal status: INITIAL  3.  Pt will  write a sentence with no significant decrease in size and maintain 50% legibility.  Baseline: 25% legibility Goal status: INITIAL  4.  Will assess 9 hole peg and set appropriate  LTG.  Baseline:  Goal status: INITIAL  5.  Will assess 3 button/button time and set appropriate LTG.  Baseline:  Goal status: INITIAL  6.  Will assess PPT#4 (don/doff jacket and set appropriate goal) Baseline:  Goal status: INITIAL  LONG TERM GOALS: Target date: 04/22/2023    Pt will be able to place at least 35 blocks using each hand with completion of Box and Blocks test. Baseline: Right 31 blocks, Left 34 blocks Goal status: INITIAL  2.  Patient will report at least two-point increase in average PSFS score or at least three-point increase in a single activity score indicating functionally significant improvement given minimum detectable change. Baseline: 3 total score (see above for single activity scores) Goal status: INITIAL  3.  Pt will verbalize understanding of ways to prevent future PD related complications and PD community resources.  Baseline: not yet initiated Goal status: INITIAL  4.  Pt will write a sentence with no significant decrease in size and maintain 75% legibility.  Baseline: 25% legibility Goal status: INITIAL   ASSESSMENT:  CLINICAL IMPRESSION: Pt demonstrates improved carry-over of HEP from previous session as needed to progress towards goals.  PERFORMANCE DEFICITS: in functional skills including ADLs, IADLs, coordination, sensation, strength, pain, Fine motor control, Gross motor control, mobility, balance, endurance, decreased knowledge of use of DME, and UE functional use, cognitive skills including attention, memory, problem solving, and safety awareness.  IMPAIRMENTS: are limiting patient from ADLs, IADLs, rest and sleep, leisure, and social participation.   COMORBIDITIES:  may have co-morbidities  that affects occupational performance. Patient will benefit from  skilled OT to address above impairments and improve overall function.  REHAB POTENTIAL: Fair given PD prognosis   PLAN:  OT FREQUENCY: 2x/week  OT DURATION: 10 weeks  PLANNED INTERVENTIONS: self care/ADL training, therapeutic exercise, therapeutic activity, manual therapy, passive range of motion, balance training, functional mobility training, splinting, ultrasound, paraffin, fluidotherapy, moist heat, patient/family education, cognitive remediation/compensation, energy conservation, coping strategies training, DME and/or AE instructions, and Re-evaluation  RECOMMENDED OTHER SERVICES: none at this time  CONSULTED AND AGREED WITH PLAN OF CARE: Patient and family member/caregiver  PLAN FOR NEXT SESSION: Review PWR! Seated (basic 4) in folding chair pushed against stable surface - if still unsafe and needs chair w/ arms, then pt will need modifications to PWR! [address] and [employer]! Step seated. Practice handwriting  (Following session: ADL strategies and ways to prevent future PD complications)   Sheran Lawless, OT 03/22/2023, 2:14 PM

## 2023-03-24 ENCOUNTER — Ambulatory Visit: Payer: Medicare (Managed Care) | Admitting: Occupational Therapy

## 2023-03-24 ENCOUNTER — Telehealth: Payer: Self-pay

## 2023-03-24 NOTE — Telephone Encounter (Signed)
   Pre-operative Risk Assessment    Patient Name: Jordan [name]  DOB: [birth year] MRN: [id number]     Request for Surgical Clearance    Procedure:   Root Canal Treatment   Date of Surgery:  Clearance 03/30/23                                 Surgeon:  Dorann Ou Surgeon's Group or Practice Name:  [facility] Fetters, DDA, PA  Phone number:  [phone] Fax number:  [phone]   Type of Clearance Requested:   - Medical    Type of Anesthesia:  Local    Additional requests/questions:   N/A  SignedStevan Born   03/24/2023, 1:41 PM

## 2023-03-24 NOTE — Telephone Encounter (Signed)
   Patient Name: Jordan [name]  DOB: May 07, 1941 MRN: [id number]  Primary Cardiologist: None  Chart reviewed as part of pre-operative protocol coverage.   SBE prophylaxis is not required for the patient from a cardiac standpoint.  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  Guidance for holding if needed should come from prescribing physician and not cardiology.  I will route this recommendation to the requesting party via Epic fax function and remove from pre-op pool.  Please call with questions.  Napoleon Form, Leodis Rains, NP 03/24/2023, 2:04 PM

## 2023-03-29 ENCOUNTER — Ambulatory Visit: Payer: Medicare (Managed Care) | Admitting: Occupational Therapy

## 2023-03-29 ENCOUNTER — Encounter: Payer: Self-pay | Admitting: Occupational Therapy

## 2023-03-29 ENCOUNTER — Encounter: Payer: Self-pay | Admitting: Physical Therapy

## 2023-03-29 ENCOUNTER — Ambulatory Visit: Payer: Medicare (Managed Care) | Admitting: Physical Therapy

## 2023-03-29 ENCOUNTER — Telehealth: Payer: Self-pay

## 2023-03-29 DIAGNOSIS — M6281 Muscle weakness (generalized): Secondary | ICD-10-CM | POA: Diagnosis not present

## 2023-03-29 DIAGNOSIS — R293 Abnormal posture: Secondary | ICD-10-CM

## 2023-03-29 DIAGNOSIS — R2681 Unsteadiness on feet: Secondary | ICD-10-CM

## 2023-03-29 DIAGNOSIS — R278 Other lack of coordination: Secondary | ICD-10-CM

## 2023-03-29 NOTE — Therapy (Signed)
OUTPATIENT PHYSICAL THERAPY NEURO TREATMENT   Patient Name: Jordan [name] MRN: [id number] DOB:[birth year], 82 y.o., male Today's Date: 03/29/2023   PCP: Johny Blamer, MD  REFERRING PROVIDER:  Horton Chin, MD  END OF SESSION:  PT End of Session - 03/29/23 1153     Visit Number 6    Number of Visits 9    Date for PT Re-Evaluation 04/10/23   due to delay in scheduling   Authorization Type [employer]    PT Start Time 1150   pt late to session   Activity Tolerance Patient tolerated treatment well    Behavior During Therapy [facility] for tasks assessed/performed;Impulsive               Past Medical History:  Diagnosis Date   Arthritis    R shoulder, bone spur   Arthritis    "hands" (03/25/2016)   Chronic diastolic heart failure (HCC)    Chronic lower back pain    Coronary heart disease    Dr Garnette Scheuermann   Diverticulitis 12/2021   DVT (deep venous thrombosis) (HCC) 05/2022   left leg   H/O cardiovascular stress test    perhaps last one was 2009   Heart murmur    12/29/11 echo: mild MR, no AS, trivial TR   History of gout    Hyperlipidemia    Hypertension    saw Dr. Mendel Ryder last earl;y- 2014, cardiac cath. last done ?2009, blocks seen didn't require any intervention at that point.   Narcolepsy    [name] 04-11-97; Mean Latency 1.66min, SOREM 2   OSA on CPAP    NPSG 12-13-98 AHI 22.7   PONV (postoperative nausea and vomiting)    Presence of permanent cardiac pacemaker    Shortness of breath    with exertion    Stroke (HCC)    Type II diabetes mellitus (HCC)    Past Surgical History:  Procedure Laterality Date   CARDIAC CATHETERIZATION  2009?   EP IMPLANTABLE DEVICE N/A 03/25/2016   Procedure: Pacemaker Implant - Dual Chamber;  Surgeon: Marinus Maw, MD;  Location: [facility] INVASIVE CV LAB;  Service: Cardiovascular;  Laterality: N/A;   FRACTURE SURGERY     HIP FRACTURE SURGERY     INSERT / REPLACE / REMOVE PACEMAKER  03/24/2016   INTRAMEDULLARY (IM) NAIL  INTERTROCHANTERIC Left 02/08/2022   Procedure: INTRAMEDULLARY (IM) NAIL INTERTROCHANTRIC;  Surgeon: Cammy Copa, MD;  Location: [facility] OR;  Service: Orthopedics;  Laterality: Left;   IR KYPHO LUMBAR INC FX REDUCE BONE BX UNI/BIL CANNULATION INC/IMAGING  12/29/2021   IR RADIOLOGIST EVAL & MGMT  12/15/2021   IR RADIOLOGY PERIPHERAL GUIDED IV START  07/14/2022   IR US GUIDE VASC ACCESS RIGHT  07/15/2022   LEFT AND RIGHT HEART CATHETERIZATION WITH CORONARY ANGIOGRAM N/A 06/05/2014   Procedure: LEFT AND RIGHT HEART CATHETERIZATION WITH CORONARY ANGIOGRAM;  Surgeon: Lesleigh Noe, MD;  Location: [facility] CATH LAB;  Service: Cardiovascular;  Laterality: N/A;   NASAL FRACTURE SURGERY     SHOULDER ARTHROSCOPY WITH ROTATOR CUFF REPAIR AND SUBACROMIAL DECOMPRESSION Right 08/16/2013   Procedure: SHOULDER ARTHROSCOPY WITH ROTATOR CUFF REPAIR AND SUBACROMIAL DECOMPRESSION;  Surgeon: Mable Paris, MD;  Location: [facility] OR;  Service: Orthopedics;  Laterality: Right;  Right shoulder arthroscopy rotator cuff repair, subacromial decompression.   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   Acute deep vein  thrombosis (DVT) of left lower extremity (HCC) 04/29/2022   Closed intertrochanteric fracture of left hip, initial encounter [facility])    Closed left hip fracture (HCC) secondary to fall at home 02/08/2022   Paroxysmal atrial fibrillation (HCC) 02/08/2022   Debility 01/08/2022   Constipation 01/07/2022   Physical deconditioning 01/07/2022   Acute metabolic encephalopathy 01/04/2022   Hypomagnesemia 01/04/2022   Pancreatitis 12/30/2021   Parkinson's disease 12/21/2021   Parkinson disease 12/18/2021   Frequent falls 12/18/2021   History of CVA (cerebrovascular accident) 12/18/2021   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   Hallucinations, visual 07/30/2021   Acetabulum fracture, left (HCC) 06/25/2021   Acute  hip pain, left 06/24/2021   Fall at home, initial encounter 06/24/2021   Leukocytosis 06/24/2021   Chronic diastolic heart failure (HCC)    Sinusitis, acute maxillary 06/24/2020   AKI (acute kidney injury) (HCC)    Labile blood glucose    Cough    Benign essential HTN    Skin lesion of hand    Hypoalbuminemia due to protein-calorie malnutrition (HCC)    Controlled type 2 diabetes mellitus with hyperglycemia, without long-term current use of insulin (HCC)    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   Morbid obesity (HCC)    Acute blood loss anemia    Acute thalamic infarction (HCC) 04/15/2020   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   TIA (transient ischemic attack) 02/17/2018   Type 2 diabetes mellitus with hyperlipidemia (HCC) 02/17/2018   Insomnia 02/01/2018   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   Chronotropic incompetence 07/09/2014   Dyspnea on exertion 03/03/2014   HLD (hyperlipidemia) 12/13/2007   Essential hypertension 12/13/2007   Class 1 obesity 12/05/2007   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   Coronary atherosclerosis 12/05/2007    ONSET DATE: 01/14/2023  REFERRING DIAG: G20.A1 (ICD-10-CM) - Parkinson's disease, unspecified whether dyskinesia present, unspecified whether manifestations fluctuate   THERAPY DIAG:  Other lack of coordination  Abnormal posture  Unsteadiness on feet  Rationale for Evaluation and Treatment: Rehabilitation  SUBJECTIVE:                                                                                                                                                                                             SUBJECTIVE STATEMENT: Has not been walking at home, has been intending to walk at home.   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 No Commonly has pain in neck and low back  There were no vitals filed for this visit.    PRECAUTIONS: Fall  WEIGHT BEARING RESTRICTIONS: No  FALLS: Has patient fallen in last 6 months? No  LIVING ENVIRONMENT: Lives with: lives with their spouse Lives in: Other Condo Stairs:  On the 10th floor, there are 2 elevators  Has following equipment at home: Dan Humphreys - 2 wheeled, shower chair, Grab bars, and transport chair  PLOF: Independent with household mobility with device and Needs assistance with ADLs Wife helps with ADLs, pt can get dressed and bathe himself it just takes longer. "Socks take forever"   PATIENT GOALS: Pt wants to get back to driving. Wants to improve strength and endurance for walking.   OBJECTIVE:    COGNITION: Overall cognitive status: Impaired   SENSATION: WFL  COORDINATION: Heel to shin; Bradykinetic with LLE   POSTURE: rounded shoulders, forward head, increased thoracic kyphosis, posterior pelvic tilt, and flexed trunk   LOWER EXTREMITY ROM:     Decr AROM with LLE due to weakness, especially with knee extension.  LOWER EXTREMITY MMT:    MMT Right Eval Left Eval  Hip flexion 4 3+  Hip extension    Hip abduction 4 3+  Hip adduction 5 4+  Hip internal rotation    Hip external rotation    Knee flexion 5 4  Knee extension 4+ 3-  Ankle dorsiflexion 4+ 4  Ankle plantarflexion    Ankle inversion    Ankle eversion    (Blank rows = not tested)  All tested in sitting.   VITALS  There were no vitals filed for this visit.      TODAY'S TREATMENT:          Therapeutic Activity:   Gait pattern: step to pattern, decreased stride length, decreased hip/knee flexion- Right, decreased ankle dorsiflexion- Right, shuffling, scissoring, trunk flexed, narrow BOS, and poor foot  clearance- Right Distance walked: 115' x 1  Assistive device utilized: Environmental consultant - 2 wheeled Level of assistance: Min A Comments: Pt with incr neck and trunk flexion with verbal cues throughout for looking ahead at target or either in a mirror. Pt can maintain for a few steps and then continues to look down. Cues to stay close to RW. Pt reports BUE are tired after using walker. Wheelchair follow needed due to fatigue after gait and needing a seated rest break.   During : 150' with pt reporting 9/10 fatigue afterwards   With sit <> stands during session, pt needing cues to scoot out towards the edge and pushing up with BUE support from w/c vs. RW.    Had discussion with pt and pt's spouse regarding discussion during previous session; that the plan was to D/C today if pt had not walked at home. Pt had not done any walking program since he was last here. Reviewed importance of a walking program and doing his exercises daily. Reviewed exercise check off sheet that OT has made for pt in recent sessions.  Pt and pt's spouse not wanting pt to be discharged today and wants to continue working with therapy, ran out of time at end of session to D/C today so discussed plan for D/C at next session.    PATIENT EDUCATION: Education details: See above Person educated: Patient and Spouse  Education method: Explanation Education comprehension: verbalized understanding  HOME EXERCISE PROGRAM:  And walking program with RW at home   Access Code: [email or web address] URL: [email or web address] Date: 03/29/2023 Prepared by: Sherlie Ban  Exercises - Seated Scapular Retraction  - 1 x daily - 7 x weekly - 2 sets - 10 reps - Seated Long Arc Quad  - 1 x daily - 7 x weekly - 2 sets - 10 reps - Seated March  - 1 x daily - 7 x weekly - 2 sets - 10 reps - Sit to Stand with Armchair  - 2 x daily - 7 x weekly - 2 sets - 5 reps - Supine Bridge  - 1 x daily - 7 x weekly - 1-2 sets - 10 reps - Deadlift with  Resistance  - 1 x daily - 5 x weekly - 1-2 sets - 10 reps - IN SITTING   GOALS: Goals reviewed with patient? Yes  SHORT TERM GOALS: ALL STGS = LTGS   LONG TERM GOALS: Target date: 04/07/2023 (updated for extended POC)            Pt will be independent with final HEP with wife supervision/assist in order to build upon functional gains made in therapy. Baseline:  Goal status: IN PROGRESS  2.  Pt will improve 5x sit<>stand to less than or equal to 25 sec with UE support to demonstrate improved functional strength and transfer efficiency.  Baseline: 32.79 seconds with BUE support, performs from transport chair  Goal status: INITIAL  3.  Pt will improve gait speed with RW to at least 1.3 ft/sec in order to demo improved household mobility. Baseline: 31.15 seconds with RW= 1.05 ft/sec  Goal status: INITIAL  4.  Pt will ambulate greater than or equal to 100 feet on with RW and min A for improved cardiovascular endurance and BLE strength.    Baseline: 21' w/RW and mod A  Goal status: REVISED    ASSESSMENT:  CLINICAL IMPRESSION: Pt returns to PT today after taking a few week hold. Pt had not been walking or doing his exercises at home. Continued to educate on importance of this and reviewed walking program/exercise handout. Pt was originally going to be discharged today if pt had not worked on walking at home, but ran out of time with discussion of this as pt and pt's spouse still wanted to continue therapy. Discussed plan to D/C at next session. Pt ambulated 150' today during with CGA/min A with pt needing to immediately sit down afterwards as pt reporting RPE as a 9/10. Cues needed throughout for looking upright for posture. Continue POC.    OBJECTIVE IMPAIRMENTS: Abnormal gait, cardiopulmonary status limiting activity, decreased activity tolerance, decreased balance, decreased cognition, decreased coordination, decreased endurance, decreased mobility, difficulty walking,  decreased ROM, decreased strength, dizziness, impaired flexibility, impaired UE functional use, and postural dysfunction.   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   REHAB POTENTIAL: Fair due to chronicity of condition, decr carryover at home.   CLINICAL DECISION MAKING: Evolving/moderate complexity  EVALUATION COMPLEXITY: Moderate  PLAN:  PT FREQUENCY: 2x/week  PT DURATION: 8 weeks - due to potential delay in scheduling, only expect 4 weeks   PLANNED INTERVENTIONS: Therapeutic exercises, Therapeutic activity, Neuromuscular re-education, Balance training, Gait training, Patient/Family education, Self Care, Vestibular training, Canalith repositioning, DME instructions, Manual therapy, and Re-evaluation  PLAN FOR NEXT SESSION: plan for D/C today. How is walking program?? Work on gait with RW for endurance, standing tasks/functional strength.    Drake Leach, PT, DPT 03/29/2023, 11:55 AM

## 2023-03-29 NOTE — Therapy (Signed)
OUTPATIENT OCCUPATIONAL THERAPY [facility]  Patient Name: Jordan [name] MRN: [id number] DOB:01/05/1941, 82 y.o., male Today's Date: 03/29/2023  PCP: Jordan Blamer, MD  REFERRING PROVIDER: Horton Chin, MD  END OF SESSION:  OT End of Session - 03/29/23 1239     Visit Number 5    Number of Visits 21    Date for OT Re-Evaluation 04/22/23    Authorization Type [employer] - [place], no auth required    Progress Note Due on Visit 10    OT Start Time 1235    OT Stop Time 1315    OT Time Calculation (min) 40 min    Activity Tolerance Patient tolerated treatment well    Behavior During Therapy WFL for tasks assessed/performed              Past Medical History:  Diagnosis Date   Arthritis    R shoulder, bone spur   Arthritis    "hands" (03/25/2016)   Chronic diastolic heart failure (HCC)    Chronic lower back pain    Coronary heart disease    Jordan [name]   Diverticulitis 12/2021   DVT (deep venous thrombosis) (HCC) 05/2022   left leg   H/O cardiovascular stress test    perhaps last one was 2009   Heart murmur    12/29/11 echo: mild MR, no AS, trivial TR   History of gout    Hyperlipidemia    Hypertension    saw Jordan [name] last earl;y- 2014, cardiac cath. last done ?2009, blocks seen didn't require any intervention at that point.   Narcolepsy    [name] 04-11-97; Mean Latency 1.49min, SOREM 2   OSA on CPAP    NPSG 12-13-98 AHI 22.7   PONV (postoperative nausea and vomiting)    Presence of permanent cardiac pacemaker    Shortness of breath    with exertion    Stroke (HCC)    Type II diabetes mellitus (HCC)    Past Surgical History:  Procedure Laterality Date   CARDIAC CATHETERIZATION  2009?   EP IMPLANTABLE DEVICE N/A 03/25/2016   Procedure: Pacemaker Implant - Dual Chamber;  Surgeon: Jordan Maw, MD;  Location: [facility] INVASIVE CV LAB;  Service: Cardiovascular;  Laterality: N/A;   FRACTURE SURGERY     HIP FRACTURE SURGERY     INSERT / REPLACE /  REMOVE PACEMAKER  03/24/2016   INTRAMEDULLARY (IM) NAIL INTERTROCHANTERIC Left 02/08/2022   Procedure: INTRAMEDULLARY (IM) NAIL INTERTROCHANTRIC;  Surgeon: Jordan Copa, MD;  Location: [facility] OR;  Service: Orthopedics;  Laterality: Left;   IR KYPHO LUMBAR INC FX REDUCE BONE BX UNI/BIL CANNULATION INC/IMAGING  12/29/2021   IR RADIOLOGIST EVAL & MGMT  12/15/2021   IR RADIOLOGY PERIPHERAL GUIDED IV START  07/14/2022   IR US GUIDE VASC ACCESS RIGHT  07/15/2022   LEFT AND RIGHT HEART CATHETERIZATION WITH CORONARY ANGIOGRAM N/A 06/05/2014   Procedure: LEFT AND RIGHT HEART CATHETERIZATION WITH CORONARY ANGIOGRAM;  Surgeon: Jordan Noe, MD;  Location[facility] CATH LAB;  Service: Cardiovascular;  Laterality: N/A;   NASAL FRACTURE SURGERY     SHOULDER ARTHROSCOPY WITH ROTATOR CUFF REPAIR AND SUBACROMIAL DECOMPRESSION Right 08/16/2013   Procedure: SHOULDER ARTHROSCOPY WITH ROTATOR CUFF REPAIR AND SUBACROMIAL DECOMPRESSION;  Surgeon: Jordan Paris, MD;  Location: [facility] OR;  Service: Orthopedics;  Laterality: Right;  Right shoulder arthroscopy rotator cuff repair, subacromial decompression.   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   Acute deep vein thrombosis (DVT) of left lower extremity (HCC) 04/29/2022   Closed intertrochanteric fracture of left hip, initial encounter [facility])    Closed left hip fracture (HCC) secondary to fall at home 02/08/2022   Paroxysmal atrial fibrillation (HCC) 02/08/2022   Debility 01/08/2022   Constipation 01/07/2022   Physical deconditioning 01/07/2022   Acute metabolic encephalopathy 01/04/2022   Hypomagnesemia 01/04/2022   Pancreatitis 12/30/2021   Parkinson's disease 12/21/2021   Parkinson disease 12/18/2021   Frequent falls 12/18/2021   History of CVA (cerebrovascular accident) 12/18/2021   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   Hallucinations, visual  07/30/2021   Acetabulum fracture, left (HCC) 06/25/2021   Acute hip pain, left 06/24/2021   Fall at home, initial encounter 06/24/2021   Leukocytosis 06/24/2021   Chronic diastolic heart failure (HCC)    Sinusitis, acute maxillary 06/24/2020   AKI (acute kidney injury) (HCC)    Labile blood glucose    Cough    Benign essential HTN    Skin lesion of hand    Hypoalbuminemia due to protein-calorie malnutrition (HCC)    Controlled type 2 diabetes mellitus with hyperglycemia, without long-term current use of insulin (HCC)    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   Morbid obesity (HCC)    Acute blood loss anemia    Acute thalamic infarction (HCC) 04/15/2020   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   TIA (transient ischemic attack) 02/17/2018   Type 2 diabetes mellitus with hyperlipidemia (HCC) 02/17/2018   Insomnia 02/01/2018   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   Chronotropic incompetence 07/09/2014   Dyspnea on exertion 03/03/2014   HLD (hyperlipidemia) 12/13/2007   Essential hypertension 12/13/2007   Class 1 obesity 12/05/2007   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    1) Left hip fracture:  -not walking much, but can walk with rolling walker I condominium unit.  -his wife accompanies him today and provides most of history -his walking  discharge is very short.  -no longer painful -has weakness in bilateral lower extremities -no more falls.    2) Parkinson's: -sometimes muscles don't cooperate the way they should. -he has vascular parkinsonism -has stooped posture   3) Right leg weakness: -when walking.  -wife is nervous about him falling -he was in skilled rehab and was discharged June 8th  -they have not yet set up outpatient therapy -he has not had any professional therapy since discharge on June 8th.    4) 6 bottom teeth removed -has more of a challenge in forming words.    5) Neuroendocrine tumor -being treated with monthly octreotide -no current symptoms currently -was having persistent abdominal pain- they thought it was a flare of diverticulitis but this was not present -CBGs increase because of octreotide- CBGs as high as 250/260 -he asks for icecream   He was  diagnosed with a vascular Parkinsonism in Jan 2022 and has history of CVA in June  2021 per pt report and spouse. Pt fell and sustained hip fx on 02/08/22 and pt underwent left  IM nailing.  04/30/22:  hospitalized with blood clot LLE--changed meds and cleared to resume OT/PT."  PRECAUTIONS: Fall  WEIGHT BEARING RESTRICTIONS: No  PAIN:  Are you having pain? No  FALLS: Has patient fallen in last 6 months? No  Lives with: lives with their spouse Lives in: Other Condo Stairs:  On the 10th floor, there are 2 elevators  Has following equipment at home: Dan Humphreys - 2 wheeled, shower chair, Grab bars, and transport chair   PLOF: Independent with household mobility with device and Needs assistance with ADLs Wife helps with ADLs, pt can get dressed and bathe himself it just takes longer. "Socks take forever"   PATIENT GOALS: Improve UE functional use  OBJECTIVE:   HAND DOMINANCE: Right  ADLs: Overall ADLs: min to moderate assistance (requires more assistance due to time constraints)  IADLs: Handwriting: 25% legible, Increased time, and Severe  micrographia  MOBILITY STATUS: Needs Assist: uses transport chair, dependent for navigation  ACTIVITY TOLERANCE: Activity tolerance: fair  FUNCTIONAL OUTCOME MEASURES: PSFS:  COORDINATION: Box and Blocks:  Right 31 blocks, Left 34 blocks  UE ROM:  WFL  UE MMT:   WFL  SENSATION: Reports paresthesias in R hand all fingers  MUSCLE TONE: not fully tested this visit  COGNITION: Overall cognitive status: Impaired  OBSERVATIONS: Bradykinesia and Postural tremors   TODAY'S TREATMENT:                                                                                                                               Reviewed PWR! Seated (basic 4) in chair w/o arm rests, w/ modifications prn for safety and d/t pt's decreased mobility, ability to follow commands and follow sequences. Modifications made in PWR! [place], [place]! Twist, and PWR! Step. Pt also could only tolerate 10 reps (vs 20 reps)   Practiced handwriting and issued PD handwriting strategies  PATIENT EDUCATION: Education details: PWR! Seated (modified), handwriting strategies Person educated: Patient and Spouse Education method: Explanation, Demonstration, and Handouts Education comprehension: verbalized understanding, returned demonstration, and needs further education  HOME EXERCISE PROGRAM: 5/9: pen tricks 03/22/23: review and reprint of coordination HEP, memory strategies, thinking skills, PD ex chart from 06/2022 03/29/23: PWR! Seated, handwriting strategies  GOALS:  SHORT TERM GOALS: Target date: 03/11/2023    Pt will be independent with PD specific HEP.  Baseline: not yet initiated Goal status: ONGOING  2.  Pt will verbalize understanding of adapted strategies to maximize safety and independence with ADLs/IADLs.  Baseline: not yet initiated Goal status: INITIAL  3.  Pt will write a sentence with no significant decrease in size and maintain 50% legibility.  Baseline: 25% legibility Goal status: INITIAL  4.   Will assess 9 hole peg and set appropriate LTG.  Baseline:  Goal status: INITIAL  5.  Will assess 3 button/button time and set appropriate LTG.  Baseline:  Goal status: INITIAL  6.  Will assess PPT#4 (don/doff jacket and set appropriate goal) Baseline:  Goal status: INITIAL  LONG TERM GOALS: Target date: 04/22/2023    Pt will be able to place at least 35 blocks using each hand with completion of Box and Blocks test. Baseline: Right 31 blocks, Left 34 blocks Goal status: INITIAL  2.  Patient will report at least two-point increase in average PSFS score or at least three-point increase in a single activity score indicating functionally significant improvement given minimum detectable change. Baseline: 3 total score (see above for single activity scores) Goal status: INITIAL  3.  Pt will verbalize understanding of ways to prevent future PD related complications and PD community resources.  Baseline: not yet initiated Goal status: INITIAL  4.  Pt will write a sentence with no significant decrease in size and maintain 75% legibility.  Baseline: 25% legibility Goal status: INITIAL   ASSESSMENT:  CLINICAL IMPRESSION: Pt/family has put together binder with PD ex chart for greater carryover. However pt/family reports not doing ex's recently PERFORMANCE DEFICITS: in functional skills including ADLs, IADLs, coordination, sensation, strength, pain, Fine motor control, Gross motor control, mobility, balance, endurance, decreased knowledge of use of DME, and UE functional use, cognitive skills including attention, memory, problem solving, and safety awareness.  IMPAIRMENTS: are limiting patient from ADLs, IADLs, rest and sleep, leisure, and social participation.   COMORBIDITIES:  may have co-morbidities  that affects occupational performance. Patient will benefit from skilled OT to address above impairments and improve overall function.  REHAB POTENTIAL: Fair given PD  prognosis   PLAN:  OT FREQUENCY: 2x/week  OT DURATION: 10 weeks  PLANNED INTERVENTIONS: self care/ADL training, therapeutic exercise, therapeutic activity, manual therapy, passive range of motion, balance training, functional mobility training, splinting, ultrasound, paraffin, fluidotherapy, moist heat, patient/family education, cognitive remediation/compensation, energy conservation, coping strategies training, DME and/or AE instructions, and Re-evaluation  RECOMMENDED OTHER SERVICES: none at this time  CONSULTED AND AGREED WITH PLAN OF CARE: Patient and family member/caregiver  PLAN FOR NEXT SESSION: ADL strategies and ways to prevent future PD complications   Sheran Lawless, OT 03/29/2023, 12:39 PM

## 2023-03-29 NOTE — Telephone Encounter (Signed)
Faxed over a Dental clearnace form signed by Dr. Mosetta Putt for [facility]. Received fax confirmation and placed original in to be scanned file.

## 2023-03-31 ENCOUNTER — Ambulatory Visit: Payer: Medicare (Managed Care) | Admitting: Physical Therapy

## 2023-03-31 ENCOUNTER — Ambulatory Visit: Payer: Medicare (Managed Care) | Admitting: Occupational Therapy

## 2023-03-31 DIAGNOSIS — R2681 Unsteadiness on feet: Secondary | ICD-10-CM

## 2023-03-31 DIAGNOSIS — R278 Other lack of coordination: Secondary | ICD-10-CM

## 2023-03-31 DIAGNOSIS — M9904 Segmental and somatic dysfunction of sacral region: Secondary | ICD-10-CM | POA: Diagnosis not present

## 2023-03-31 DIAGNOSIS — M6281 Muscle weakness (generalized): Secondary | ICD-10-CM

## 2023-03-31 DIAGNOSIS — M9905 Segmental and somatic dysfunction of pelvic region: Secondary | ICD-10-CM | POA: Diagnosis not present

## 2023-03-31 DIAGNOSIS — M9903 Segmental and somatic dysfunction of lumbar region: Secondary | ICD-10-CM | POA: Diagnosis not present

## 2023-03-31 DIAGNOSIS — R251 Tremor, unspecified: Secondary | ICD-10-CM

## 2023-03-31 DIAGNOSIS — R41840 Attention and concentration deficit: Secondary | ICD-10-CM

## 2023-03-31 DIAGNOSIS — R293 Abnormal posture: Secondary | ICD-10-CM

## 2023-03-31 DIAGNOSIS — M5136 Other intervertebral disc degeneration, lumbar region: Secondary | ICD-10-CM | POA: Diagnosis not present

## 2023-03-31 DIAGNOSIS — R2689 Other abnormalities of gait and mobility: Secondary | ICD-10-CM

## 2023-03-31 NOTE — Therapy (Signed)
OUTPATIENT PHYSICAL THERAPY NEURO TREATMENT-DISCHARGE SUMMARY   Patient Name: Jordan [name] MRN: [id number] DOB:[birth year], 82 y.o., male Today's Date: 03/31/2023   PCP: Johny Blamer, MD  REFERRING PROVIDER:  Horton Chin, MD  PHYSICAL THERAPY DISCHARGE SUMMARY  Visits from Start of Care: 7  Current functional level related to goals / functional outcomes: Pt requires min A w/mobility and RW at all times    Remaining deficits: Inattention to L side, poor postural control, decreased strength and decreased activity tolerance    Education / Equipment: HEP, strong emphasis on walking program    Patient agrees to discharge. Patient goals were partially met. Patient is being discharged due to lack of progress.   END OF SESSION:  PT End of Session - 03/31/23 1342     Visit Number 7    Number of Visits 9    Date for PT Re-Evaluation 04/10/23   due to delay in scheduling   Authorization Type [employer]    PT Start Time 1322   Therapist running late   PT Stop Time 1403    PT Time Calculation (min) 41 min    Equipment Utilized During Treatment Gait belt    Activity Tolerance Patient tolerated treatment well    Behavior During Therapy WFL for tasks assessed/performed                Past Medical History:  Diagnosis Date   Arthritis    R shoulder, bone spur   Arthritis    "hands" (03/25/2016)   Chronic diastolic heart failure (HCC)    Chronic lower back pain    Coronary heart disease    Dr Garnette Scheuermann   Diverticulitis 12/2021   DVT (deep venous thrombosis) (HCC) 05/2022   left leg   H/O cardiovascular stress test    perhaps last one was 2009   Heart murmur    12/29/11 echo: mild MR, no AS, trivial TR   History of gout    Hyperlipidemia    Hypertension    saw Dr. Mendel Ryder last earl;y- 2014, cardiac cath. last done ?2009, blocks seen didn't require any intervention at that point.   Narcolepsy    [name] 04-11-97; Mean Latency 1.66min, SOREM 2   OSA on  CPAP    NPSG 12-13-98 AHI 22.7   PONV (postoperative nausea and vomiting)    Presence of permanent cardiac pacemaker    Shortness of breath    with exertion    Stroke (HCC)    Type II diabetes mellitus (HCC)    Past Surgical History:  Procedure Laterality Date   CARDIAC CATHETERIZATION  2009?   EP IMPLANTABLE DEVICE N/A 03/25/2016   Procedure: Pacemaker Implant - Dual Chamber;  Surgeon: Marinus Maw, MD;  Location: [facility] INVASIVE CV LAB;  Service: Cardiovascular;  Laterality: N/A;   FRACTURE SURGERY     HIP FRACTURE SURGERY     INSERT / REPLACE / REMOVE PACEMAKER  03/24/2016   INTRAMEDULLARY (IM) NAIL INTERTROCHANTERIC Left 02/08/2022   Procedure: INTRAMEDULLARY (IM) NAIL INTERTROCHANTRIC;  Surgeon: Cammy Copa, MD;  Location: [facility] OR;  Service: Orthopedics;  Laterality: Left;   IR KYPHO LUMBAR INC FX REDUCE BONE BX UNI/BIL CANNULATION INC/IMAGING  12/29/2021   IR RADIOLOGIST EVAL & MGMT  12/15/2021   IR RADIOLOGY PERIPHERAL GUIDED IV START  07/14/2022   IR US GUIDE VASC ACCESS RIGHT  07/15/2022   LEFT AND RIGHT HEART CATHETERIZATION WITH CORONARY ANGIOGRAM N/A 06/05/2014   Procedure: LEFT AND RIGHT HEART  CATHETERIZATION WITH CORONARY ANGIOGRAM;  Surgeon: Lesleigh Noe, MD;  Location[facility] CATH LAB;  Service: Cardiovascular;  Laterality: N/A;   NASAL FRACTURE SURGERY     SHOULDER ARTHROSCOPY WITH ROTATOR CUFF REPAIR AND SUBACROMIAL DECOMPRESSION Right 08/16/2013   Procedure: SHOULDER ARTHROSCOPY WITH ROTATOR CUFF REPAIR AND SUBACROMIAL DECOMPRESSION;  Surgeon: Mable Paris, MD;  Location: [facility] OR;  Service: Orthopedics;  Laterality: Right;  Right shoulder arthroscopy rotator cuff repair, subacromial decompression.   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   Acute deep vein thrombosis (DVT) of left lower extremity (HCC) 04/29/2022   Closed intertrochanteric fracture of  left hip, initial encounter [facility])    Closed left hip fracture (HCC) secondary to fall at home 02/08/2022   Paroxysmal atrial fibrillation (HCC) 02/08/2022   Debility 01/08/2022   Constipation 01/07/2022   Physical deconditioning 01/07/2022   Acute metabolic encephalopathy 01/04/2022   Hypomagnesemia 01/04/2022   Pancreatitis 12/30/2021   Parkinson's disease 12/21/2021   Parkinson disease 12/18/2021   Frequent falls 12/18/2021   History of CVA (cerebrovascular accident) 12/18/2021   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   Hallucinations, visual 07/30/2021   Acetabulum fracture, left (HCC) 06/25/2021   Acute hip pain, left 06/24/2021   Fall at home, initial encounter 06/24/2021   Leukocytosis 06/24/2021   Chronic diastolic heart failure (HCC)    Sinusitis, acute maxillary 06/24/2020   AKI (acute kidney injury) (HCC)    Labile blood glucose    Cough    Benign essential HTN    Skin lesion of hand    Hypoalbuminemia due to protein-calorie malnutrition (HCC)    Controlled type 2 diabetes mellitus with hyperglycemia, without long-term current use of insulin (HCC)    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   Morbid obesity (HCC)    Acute blood loss anemia    Acute thalamic infarction (HCC) 04/15/2020   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   TIA (transient ischemic attack) 02/17/2018   Type 2 diabetes mellitus with hyperlipidemia (HCC) 02/17/2018   Insomnia 02/01/2018   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   Chronotropic incompetence 07/09/2014   Dyspnea on exertion 03/03/2014   HLD (hyperlipidemia) 12/13/2007   Essential hypertension 12/13/2007   Class 1 obesity 12/05/2007   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 No acute changes.   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 No Commonly has pain in neck and low back  There were no vitals filed for this visit.    PRECAUTIONS: Fall  WEIGHT BEARING RESTRICTIONS: No  FALLS: Has patient fallen in last 6 months? No  LIVING ENVIRONMENT: Lives with: lives with their spouse Lives in: Other Condo Stairs:  On the 10th floor, there are 2 elevators  Has following equipment at home: Dan Humphreys - 2 wheeled, shower chair, Grab bars, and transport chair  PLOF: Independent with household mobility with device and Needs assistance with ADLs Wife helps with ADLs, pt can get dressed and bathe himself it just takes longer. "Socks take forever"   PATIENT GOALS: Pt wants to get back to driving. Wants to improve strength and endurance for walking.   OBJECTIVE:    COGNITION: Overall cognitive status: Impaired   SENSATION: WFL  COORDINATION: Heel to shin; Bradykinetic with LLE   POSTURE: rounded shoulders, forward head, increased thoracic kyphosis, posterior pelvic tilt, and flexed trunk   LOWER EXTREMITY ROM:     Decr AROM with LLE due to weakness, especially with  knee extension.  LOWER EXTREMITY MMT:    MMT Right Eval Left Eval  Hip flexion 4 3+  Hip extension    Hip abduction 4 3+  Hip adduction 5 4+  Hip internal rotation    Hip external rotation    Knee flexion 5 4  Knee extension 4+ 3-  Ankle dorsiflexion 4+ 4  Ankle plantarflexion    Ankle inversion    Ankle eversion    (Blank rows = not tested)  All tested in sitting.   VITALS  There were no vitals filed for this visit.      TODAY'S TREATMENT:          Therapeutic Activity  LTG Assessment   [facility] PT Assessment - 03/31/23 1346       Transfers   Five time sit to stand comments  21.22s   w/BUE support     Ambulation/Gait   Gait velocity 32.8' over 27.34s =1.19 ft/s   w/RW and min A           Lengthy discussion regarding importance of pt walking and doing HEP at home for improved functional gains. Pt has not been compliant w/walking at home and has not made significant progress in therapy. Informed pt that he needs to work on walking consistently at home, starting at 2 minutes at a time and adding 1 minute per week until reaching 10-15 minutes consistently. Informed pt and wife that pt may return in 3 mo or if there is a significant change in mobility. Pt and wife verbalized understanding.    Gait pattern: step through pattern, decreased stride length, Right foot flat, Left foot flat, shuffling, trunk flexed, narrow BOS, poor foot clearance- Right, and poor foot clearance- Left Distance walked: Various short clinic distances  Assistive device utilized: Walker - 2 wheeled Level of assistance: Min A Comments: Mod verbal cues required to avoid obstacles on L side and to maintain close  distance to RW. Pt stepping outside of RW w/turns despite max cues. Min cues to maintain forward gaze, as pt looking down at feet.     PATIENT EDUCATION: Education details: See above Person educated: Patient and Spouse  Education method: Explanation Education comprehension: verbalized  understanding  HOME EXERCISE PROGRAM:  And walking program with RW at home   Access Code: [email or web address] URL: [email or web address] Date: 03/29/2023 Prepared by: Sherlie Ban  Exercises - Seated Scapular Retraction  - 1 x daily - 7 x weekly - 2 sets - 10 reps - Seated Long Arc Quad  - 1 x daily - 7 x weekly - 2 sets - 10 reps - Seated March  - 1 x daily - 7 x weekly - 2 sets - 10 reps - Sit to Stand with Armchair  - 2 x daily - 7 x weekly - 2 sets - 5 reps - Supine Bridge  - 1 x daily - 7 x weekly - 1-2 sets - 10 reps - Deadlift with Resistance  - 1 x daily - 5 x weekly - 1-2 sets - 10 reps - IN SITTING   GOALS: Goals reviewed with patient? Yes  SHORT TERM GOALS: ALL STGS = LTGS   LONG TERM GOALS: Target date: 04/07/2023 (updated for extended POC)            Pt will be independent with final HEP with wife supervision/assist in order to build upon functional gains made in therapy. Baseline:  Goal status: NOT MET  2.  Pt will improve 5x sit<>stand to less than or equal to 25 sec with UE support to demonstrate improved functional strength and transfer efficiency.  Baseline: 32.79 seconds with BUE support, performs from transport chair; 21.22s w/BUE support  Goal status: MET  3.  Pt will improve gait speed with RW to at least 1.3 ft/sec in order to demo improved household mobility. Baseline: 31.15 seconds with RW= 1.05 ft/sec; 1.19 ft/s (5/30) Goal status: NOT MET  4.  Pt will ambulate greater than or equal to 100 feet on with RW and min A for improved cardiovascular endurance and BLE strength.    Baseline: 12' w/RW and mod A; 115' w/min A Goal status: MET    ASSESSMENT:  CLINICAL IMPRESSION: Emphasis of skilled PT session on LTG assessment and DC from PT. Pt has met 2 of 4 LTGs, improving his time on 5x STS and ambulating 115' on w/RW and min A. Pt has not been compliant w/HEP or walking program, limiting his progress in therapy. Pt had previously  taken a few weeks off therapy to work on walking at home, but did not walk. Educated pt and wife on importance on working on walking at home for improved endurance and activity tolerance. Pt will be DC from PT today due to noncompliance w/HEP and limited progress in therapy. Pt and wife verbalized understanding.    OBJECTIVE IMPAIRMENTS: Abnormal gait, cardiopulmonary status limiting activity, decreased activity tolerance, decreased balance, decreased cognition, decreased coordination, decreased endurance, decreased mobility, difficulty walking, decreased ROM, decreased strength, dizziness, impaired flexibility, impaired UE functional use, and postural dysfunction.   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   REHAB POTENTIAL: Fair due to chronicity of condition, decr carryover at home.   CLINICAL DECISION MAKING: Evolving/moderate complexity  EVALUATION COMPLEXITY: Moderate  PLAN:  PT FREQUENCY: 2x/week  PT DURATION: 8 weeks - due to potential delay in scheduling, only expect 4 weeks   PLANNED INTERVENTIONS: Therapeutic exercises, Therapeutic activity, Neuromuscular re-education, Balance training, Gait training, Patient/Family education, Self Care, Vestibular training, Canalith repositioning, DME instructions, Manual therapy, and Re-evaluation    Avyukth Bontempo E Cornelious Diven, PT,  DPT 03/31/2023, 2:11 PM

## 2023-03-31 NOTE — Therapy (Signed)
OUTPATIENT OCCUPATIONAL THERAPY [facility]  Patient Name: Jordan [name] MRN: [id number] DOB:September 08, 1941, 82 y.o., male Today's Date: 03/31/2023  PCP: Johny Blamer, MD  REFERRING PROVIDER: Horton Chin, MD  END OF SESSION:  OT End of Session - 03/31/23 1608     Visit Number 6    Number of Visits 21    Date for OT Re-Evaluation 04/22/23    Authorization Type [employer] - [place], no auth required    Progress Note Due on Visit 10    OT Start Time 1234    OT Stop Time 1314    OT Time Calculation (min) 40 min    Activity Tolerance Patient tolerated treatment well    Behavior During Therapy WFL for tasks assessed/performed               Past Medical History:  Diagnosis Date   Arthritis    R shoulder, bone spur   Arthritis    "hands" (03/25/2016)   Chronic diastolic heart failure (HCC)    Chronic lower back pain    Coronary heart disease    Dr Garnette Scheuermann   Diverticulitis 12/2021   DVT (deep venous thrombosis) (HCC) 05/2022   left leg   H/O cardiovascular stress test    perhaps last one was 2009   Heart murmur    12/29/11 echo: mild MR, no AS, trivial TR   History of gout    Hyperlipidemia    Hypertension    saw Dr. Mendel Ryder last earl;y- 2014, cardiac cath. last done ?2009, blocks seen didn't require any intervention at that point.   Narcolepsy    [name] 04-11-97; Mean Latency 1.76min, SOREM 2   OSA on CPAP    NPSG 12-13-98 AHI 22.7   PONV (postoperative nausea and vomiting)    Presence of permanent cardiac pacemaker    Shortness of breath    with exertion    Stroke (HCC)    Type II diabetes mellitus (HCC)    Past Surgical History:  Procedure Laterality Date   CARDIAC CATHETERIZATION  2009?   EP IMPLANTABLE DEVICE N/A 03/25/2016   Procedure: Pacemaker Implant - Dual Chamber;  Surgeon: Marinus Maw, MD;  Location: [facility] INVASIVE CV LAB;  Service: Cardiovascular;  Laterality: N/A;   FRACTURE SURGERY     HIP FRACTURE SURGERY     INSERT / REPLACE  / REMOVE PACEMAKER  03/24/2016   INTRAMEDULLARY (IM) NAIL INTERTROCHANTERIC Left 02/08/2022   Procedure: INTRAMEDULLARY (IM) NAIL INTERTROCHANTRIC;  Surgeon: Cammy Copa, MD;  Location: [facility] OR;  Service: Orthopedics;  Laterality: Left;   IR KYPHO LUMBAR INC FX REDUCE BONE BX UNI/BIL CANNULATION INC/IMAGING  12/29/2021   IR RADIOLOGIST EVAL & MGMT  12/15/2021   IR RADIOLOGY PERIPHERAL GUIDED IV START  07/14/2022   IR US GUIDE VASC ACCESS RIGHT  07/15/2022   LEFT AND RIGHT HEART CATHETERIZATION WITH CORONARY ANGIOGRAM N/A 06/05/2014   Procedure: LEFT AND RIGHT HEART CATHETERIZATION WITH CORONARY ANGIOGRAM;  Surgeon: Lesleigh Noe, MD;  Location[facility] CATH LAB;  Service: Cardiovascular;  Laterality: N/A;   NASAL FRACTURE SURGERY     SHOULDER ARTHROSCOPY WITH ROTATOR CUFF REPAIR AND SUBACROMIAL DECOMPRESSION Right 08/16/2013   Procedure: SHOULDER ARTHROSCOPY WITH ROTATOR CUFF REPAIR AND SUBACROMIAL DECOMPRESSION;  Surgeon: Mable Paris, MD;  Location: [facility] OR;  Service: Orthopedics;  Laterality: Right;  Right shoulder arthroscopy rotator cuff repair, subacromial decompression.   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   Acute deep vein thrombosis (DVT) of left lower extremity (HCC) 04/29/2022   Closed intertrochanteric fracture of left hip, initial encounter [facility])    Closed left hip fracture (HCC) secondary to fall at home 02/08/2022   Paroxysmal atrial fibrillation (HCC) 02/08/2022   Debility 01/08/2022   Constipation 01/07/2022   Physical deconditioning 01/07/2022   Acute metabolic encephalopathy 01/04/2022   Hypomagnesemia 01/04/2022   Pancreatitis 12/30/2021   Parkinson's disease 12/21/2021   Parkinson disease 12/18/2021   Frequent falls 12/18/2021   History of CVA (cerebrovascular accident) 12/18/2021   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   Hallucinations, visual  07/30/2021   Acetabulum fracture, left (HCC) 06/25/2021   Acute hip pain, left 06/24/2021   Fall at home, initial encounter 06/24/2021   Leukocytosis 06/24/2021   Chronic diastolic heart failure (HCC)    Sinusitis, acute maxillary 06/24/2020   AKI (acute kidney injury) (HCC)    Labile blood glucose    Cough    Benign essential HTN    Skin lesion of hand    Hypoalbuminemia due to protein-calorie malnutrition (HCC)    Controlled type 2 diabetes mellitus with hyperglycemia, without long-term current use of insulin (HCC)    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   Morbid obesity (HCC)    Acute blood loss anemia    Acute thalamic infarction (HCC) 04/15/2020   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   TIA (transient ischemic attack) 02/17/2018   Type 2 diabetes mellitus with hyperlipidemia (HCC) 02/17/2018   Insomnia 02/01/2018   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   Chronotropic incompetence 07/09/2014   Dyspnea on exertion 03/03/2014   HLD (hyperlipidemia) 12/13/2007   Essential hypertension 12/13/2007   Class 1 obesity 12/05/2007   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   Pt accompanied by:  wife, [name]  PERTINENT HISTORY: "Mr. Mcmullen is an 82 year old man who presents for follow-up after hip fracture.    1) Left hip fracture:  -not walking much, but can walk with rolling walker I  condominium unit.  -his wife accompanies him today and provides most of history -his walking discharge is very short.  -no longer painful -has weakness in bilateral lower extremities -no more falls.    2) Parkinson's: -sometimes muscles don't cooperate the way they should. -he has vascular parkinsonism -has stooped posture   3) Right leg weakness: -when walking.  -wife is nervous about him falling -he was in skilled rehab and was discharged June 8th  -they have not yet set up outpatient therapy -he has not had any professional therapy since discharge on June 8th.    4) 6 bottom teeth removed -has more of a challenge in forming words.    5) Neuroendocrine tumor -being treated with monthly octreotide -no current symptoms currently -was having persistent abdominal pain- they thought it was a flare of diverticulitis but this was not present -CBGs increase because of octreotide- CBGs as high as 250/260 -he asks for icecream  He was  diagnosed with a vascular Parkinsonism in Jan 2022 and has history of CVA in June 2021 per pt report and spouse. Pt fell and sustained hip fx on 02/08/22 and pt underwent left  IM nailing.  04/30/22:  hospitalized with blood clot LLE--changed meds and cleared to resume OT/PT."  PRECAUTIONS: Fall  WEIGHT BEARING RESTRICTIONS: No  PAIN:  Are you having pain? No  FALLS: Has patient fallen in last 6 months? No  Lives with: lives with their spouse Lives in: Other Condo Stairs:  On the 10th floor, there are 2 elevators  Has following equipment at home: Dan Humphreys - 2 wheeled, shower chair, Grab bars, and transport chair   PLOF: Independent with household mobility with device and Needs assistance with ADLs Wife helps with ADLs, pt can get dressed and bathe himself it just takes longer. "Socks take forever"   PATIENT GOALS: Improve UE functional use  OBJECTIVE:   HAND DOMINANCE: Right  ADLs: Overall ADLs: min to moderate assistance (requires more  assistance due to time constraints)  IADLs: Handwriting: 25% legible, Increased time, and Severe micrographia  MOBILITY STATUS: Needs Assist: uses transport chair, dependent for navigation  ACTIVITY TOLERANCE: Activity tolerance: fair  FUNCTIONAL OUTCOME MEASURES: PSFS:  COORDINATION: Box and Blocks:  Right 31 blocks, Left 34 blocks  UE ROM:  WFL  UE MMT:   WFL  SENSATION: Reports paresthesias in R hand all fingers  MUSCLE TONE: not fully tested this visit  COGNITION: Overall cognitive status: Impaired  OBSERVATIONS: Bradykinesia and Postural tremors   TODAY'S TREATMENT:                                                                                                                               Reviewed PWR! Seated (basic 4) in transport chair w/o arm rests,  Pt completing PWR UP without modifications but boosts to look up to cone for more upright head, chest, and trunk posturing.   Pt completing PWR twist positioned in front of mat table with use of [employer] to reach to agility dots to facilitate improved ability to cross midline.  Attempted PWR!UP mobility to complete sit to stand though adjustments made to have pt achieve clearing of hips from chair while weight bearing though BUE positioned edge of mat table. Gait belt utilized and therapist blocking transport chair. Pt also completing modified PWR! UP to scoot all the way back in chair.   PATIENT EDUCATION: Education details: PWR! Seated (modified) Person educated: Patient and Spouse Education method: Explanation, Demonstration, and Handouts Education comprehension: verbalized understanding, returned demonstration, and needs further education  HOME EXERCISE PROGRAM: 5/9: pen tricks 03/22/23: review and reprint of coordination HEP, memory strategies, thinking skills, PD ex chart from 06/2022 03/29/23: PWR! Seated, handwriting strategies  GOALS:  SHORT TERM GOALS: Target date: 03/11/2023    Pt will be  independent with PD specific HEP.  Baseline: not yet initiated Goal status: ONGOING  2.  Pt  will verbalize understanding of adapted strategies to maximize safety and independence with ADLs/IADLs.  Baseline: not yet initiated Goal status: INITIAL  3.  Pt will write a sentence with no significant decrease in size and maintain 50% legibility.  Baseline: 25% legibility Goal status: INITIAL  4.  Will assess 9 hole peg and set appropriate LTG.  Baseline:  Goal status: INITIAL  5.  Will assess 3 button/button time and set appropriate LTG.  Baseline:  Goal status: INITIAL  6.  Will assess PPT#4 (don/doff jacket and set appropriate goal) Baseline:  Goal status: INITIAL  LONG TERM GOALS: Target date: 04/22/2023    Pt will be able to place at least 35 blocks using each hand with completion of Box and Blocks test. Baseline: Right 31 blocks, Left 34 blocks Goal status: INITIAL  2.  Patient will report at least two-point increase in average PSFS score or at least three-point increase in a single activity score indicating functionally significant improvement given minimum detectable change. Baseline: 3 total score (see above for single activity scores) Goal status: INITIAL  3.  Pt will verbalize understanding of ways to prevent future PD related complications and PD community resources.  Baseline: not yet initiated Goal status: INITIAL  4.  Pt will write a sentence with no significant decrease in size and maintain 75% legibility.  Baseline: 25% legibility Goal status: INITIAL   ASSESSMENT:  CLINICAL IMPRESSION: Pt and spouse seem motivated to participate in therapy and carryover treatment outside of clinic as needed to progress towards goals. Will continue to monitor progression towards goals.  PERFORMANCE DEFICITS: in functional skills including ADLs, IADLs, coordination, sensation, strength, pain, Fine motor control, Gross motor control, mobility, balance, endurance, decreased  knowledge of use of DME, and UE functional use, cognitive skills including attention, memory, problem solving, and safety awareness.  IMPAIRMENTS: are limiting patient from ADLs, IADLs, rest and sleep, leisure, and social participation.   COMORBIDITIES:  may have co-morbidities  that affects occupational performance. Patient will benefit from skilled OT to address above impairments and improve overall function.  REHAB POTENTIAL: Fair given PD prognosis   PLAN:  OT FREQUENCY: 2x/week  OT DURATION: 10 weeks  PLANNED INTERVENTIONS: self care/ADL training, therapeutic exercise, therapeutic activity, manual therapy, passive range of motion, balance training, functional mobility training, splinting, ultrasound, paraffin, fluidotherapy, moist heat, patient/family education, cognitive remediation/compensation, energy conservation, coping strategies training, DME and/or AE instructions, and Re-evaluation  RECOMMENDED OTHER SERVICES: none at this time  CONSULTED AND AGREED WITH PLAN OF CARE: Patient and family member/caregiver  PLAN FOR NEXT SESSION: ADL strategies and ways to prevent future PD complications; Continue to work on [employer]! Moves with modifications to progress toward sit to stands as able   Delana Meyer, OT 03/31/2023, 4:09 PM

## 2023-04-05 ENCOUNTER — Ambulatory Visit: Payer: Medicare (Managed Care) | Attending: Physical Medicine and Rehabilitation | Admitting: Occupational Therapy

## 2023-04-05 ENCOUNTER — Encounter: Payer: Self-pay | Admitting: Occupational Therapy

## 2023-04-05 ENCOUNTER — Ambulatory Visit: Payer: Medicare (Managed Care) | Admitting: Physical Therapy

## 2023-04-05 DIAGNOSIS — M6281 Muscle weakness (generalized): Secondary | ICD-10-CM | POA: Insufficient documentation

## 2023-04-05 DIAGNOSIS — R293 Abnormal posture: Secondary | ICD-10-CM | POA: Diagnosis not present

## 2023-04-05 DIAGNOSIS — R41840 Attention and concentration deficit: Secondary | ICD-10-CM | POA: Diagnosis not present

## 2023-04-05 DIAGNOSIS — R278 Other lack of coordination: Secondary | ICD-10-CM | POA: Diagnosis not present

## 2023-04-05 DIAGNOSIS — R251 Tremor, unspecified: Secondary | ICD-10-CM | POA: Diagnosis not present

## 2023-04-05 DIAGNOSIS — R2681 Unsteadiness on feet: Secondary | ICD-10-CM | POA: Insufficient documentation

## 2023-04-05 NOTE — Patient Instructions (Addendum)
LAYING DOWN:   NECK: [employer] Extension    Sitting: Tuck chin, move head and neck backward. Lying Down: Tuck chin, push back of neck into pillow. _10__ reps per set, _2__ sets per day  Neck Rotation    Begin with chin level, head centered over spine. Slowly turn head to look over shoulder, keeping head centered, shoulders and torso stationary. Hold __10__ seconds. Slowly return to starting position. Repeat to other side. Repeat __5__ times to each side.      Ways to prevent future Parkinson's related complications:  1.   Exercise regularly/daily.    2.   Focus on BIGGER movements during daily activities- really reach overhead, straighten elbows and extend fingers  3.   When dressing (especially jacket/coat) or reaching for your seatbelt make sure to use your body to assist by twisting while you reach and looking at where you are reaching - this can help to minimize stress on the shoulder and reduce the risk of a rotator cuff tear  4.   Swing your arms when you walk! People with PD are at increased risk for frozen shoulder and swinging your arms can reduce this risk.  5. Drink plenty of water and eat a high fiber diet  6. Do NOT take your Parkinson's medication around meals (avoid taking 30 min before a meal, or 1 hour after a meal), especially protein as it blocks the absorption of the medicine and will not work effectively  7. Keep your feet apart when you are standing (wider stance) to allow you to have better balance and to reach further with your arms. Also make sure your feet are apart before standing up.     Performing Daily Activities with Big Movements  Pick at least one activity a day and perform with BIG, DELIBERATE movements/effort. This can make the activity easier and turn daily activities into exercise!  If you are standing during the activity, make sure to keep feet apart and stand with good/big/PWR! UP posture.  Examples: Dressing - Push arms in sleeves,  twist when putting on jacket, push foot into pants, open hands to pull down shirt/put on socks/pull up pants Bathing - Wash/dry with long strokes Brushing your teeth - Big, slow movements Cutting food - Long deliberate cuts Opening jar/bottle - Move as much as you can with each turn Picking up a cup/bottle - Open hand up big and get object all the way in palm Hanging up clothes/getting clothes down from closet - Reach with big effort Putting away groceries/dishes - Reach with big effort Wiping counter/table - Move in big, long strokes Folding clothes - Exaggerate arm movements Changing light bulb - Move as much as you can with each turn Using a screwdriver - Move as much as you can with each turn Walking into a store/restaurant - Walk with big steps, swing arms if able Standing up from a chair/recliner/sofa - Scoot forward, lean forward, and stand with big effort

## 2023-04-05 NOTE — Therapy (Signed)
OUTPATIENT OCCUPATIONAL THERAPY [facility]  Patient Name: Jordan [name] MRN: [id number] DOB:Dec 15, 1940, 82 y.o., male Today's Date: 04/05/2023  PCP: Jordan Blamer, MD  REFERRING PROVIDER: Horton Chin, MD  END OF SESSION:  OT End of Session - 04/05/23 1247     Visit Number 7    Number of Visits 21    Date for OT Re-Evaluation 04/22/23    Authorization Type [employer] - [place], no auth required    Progress Note Due on Visit 10    OT Start Time 1235    OT Stop Time 1320    OT Time Calculation (min) 45 min    Activity Tolerance Patient tolerated treatment well    Behavior During Therapy WFL for tasks assessed/performed               Past Medical History:  Diagnosis Date   Arthritis    R shoulder, bone spur   Arthritis    "hands" (03/25/2016)   Chronic diastolic heart failure (HCC)    Chronic lower back pain    Coronary heart disease    Dr Jordan [name]   Diverticulitis 12/2021   DVT (deep venous thrombosis) (HCC) 05/2022   left leg   H/O cardiovascular stress test    perhaps last one was 2009   Heart murmur    12/29/11 echo: mild MR, no AS, trivial TR   History of gout    Hyperlipidemia    Hypertension    saw Dr. Mendel [name] last earl;y- 2014, cardiac cath. last done ?2009, blocks seen didn't require any intervention at that point.   Narcolepsy    [name] 04-11-97; Mean Latency 1.66min, SOREM 2   OSA on CPAP    NPSG 12-13-98 AHI 22.7   PONV (postoperative nausea and vomiting)    Presence of permanent cardiac pacemaker    Shortness of breath    with exertion    Stroke (HCC)    Type II diabetes mellitus (HCC)    Past Surgical History:  Procedure Laterality Date   CARDIAC CATHETERIZATION  2009?   EP IMPLANTABLE DEVICE N/A 03/25/2016   Procedure: Pacemaker Implant - Dual Chamber;  Surgeon: Jordan Maw, MD;  Location: [facility] INVASIVE CV LAB;  Service: Cardiovascular;  Laterality: N/A;   FRACTURE SURGERY     HIP FRACTURE SURGERY     INSERT / REPLACE  / REMOVE PACEMAKER  03/24/2016   INTRAMEDULLARY (IM) Jordan INTERTROCHANTERIC Left 02/08/2022   Procedure: INTRAMEDULLARY (IM) Jordan INTERTROCHANTRIC;  Surgeon: Jordan Copa, MD;  Location: [facility] OR;  Service: Orthopedics;  Laterality: Left;   IR KYPHO LUMBAR INC FX REDUCE BONE BX UNI/BIL CANNULATION INC/IMAGING  12/29/2021   IR RADIOLOGIST EVAL & MGMT  12/15/2021   IR RADIOLOGY PERIPHERAL GUIDED IV START  07/14/2022   IR US GUIDE VASC ACCESS RIGHT  07/15/2022   LEFT AND RIGHT HEART CATHETERIZATION WITH CORONARY ANGIOGRAM N/A 06/05/2014   Procedure: LEFT AND RIGHT HEART CATHETERIZATION WITH CORONARY ANGIOGRAM;  Surgeon: Jordan Noe, MD;  Location: [facility] CATH LAB;  Service: Cardiovascular;  Laterality: N/A;   NASAL FRACTURE SURGERY     SHOULDER ARTHROSCOPY WITH ROTATOR CUFF REPAIR AND SUBACROMIAL DECOMPRESSION Right 08/16/2013   Procedure: SHOULDER ARTHROSCOPY WITH ROTATOR CUFF REPAIR AND SUBACROMIAL DECOMPRESSION;  Surgeon: Jordan Paris, MD;  Location: [facility] OR;  Service: Orthopedics;  Laterality: Right;  Right shoulder arthroscopy rotator cuff repair, subacromial decompression.   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   Acute deep vein thrombosis (DVT) of left lower extremity (HCC) 04/29/2022   Closed intertrochanteric fracture of left hip, initial encounter [facility])    Closed left hip fracture (HCC) secondary to fall at home 02/08/2022   Paroxysmal atrial fibrillation (HCC) 02/08/2022   Debility 01/08/2022   Constipation 01/07/2022   Physical deconditioning 01/07/2022   Acute metabolic encephalopathy 01/04/2022   Hypomagnesemia 01/04/2022   Pancreatitis 12/30/2021   Parkinson's disease 12/21/2021   Parkinson disease 12/18/2021   Frequent falls 12/18/2021   History of CVA (cerebrovascular accident) 12/18/2021   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   Hallucinations, visual  07/30/2021   Acetabulum fracture, left (HCC) 06/25/2021   Acute hip pain, left 06/24/2021   Fall at home, initial encounter 06/24/2021   Leukocytosis 06/24/2021   Chronic diastolic heart failure (HCC)    Sinusitis, acute maxillary 06/24/2020   AKI (acute kidney injury) (HCC)    Labile blood glucose    Cough    Benign essential HTN    Skin lesion of hand    Hypoalbuminemia due to protein-calorie malnutrition (HCC)    Controlled type 2 diabetes mellitus with hyperglycemia, without long-term current use of insulin (HCC)    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   Morbid obesity (HCC)    Acute blood loss anemia    Acute thalamic infarction (HCC) 04/15/2020   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   TIA (transient ischemic attack) 02/17/2018   Type 2 diabetes mellitus with hyperlipidemia (HCC) 02/17/2018   Insomnia 02/01/2018   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   Chronotropic incompetence 07/09/2014   Dyspnea on exertion 03/03/2014   HLD (hyperlipidemia) 12/13/2007   Essential hypertension 12/13/2007   Class 1 obesity 12/05/2007   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   Coronary atherosclerosis 12/05/2007    ONSET DATE: 01/14/2023 (date of referral)  REFERRING DIAG: G20.A1 (ICD-10-CM) - Parkinson's disease, unspecified whether dyskinesia present, unspecified whether manifestations fluctuate  THERAPY DIAG:  Abnormal posture  Muscle weakness (generalized)  Other lack of coordination  Attention and concentration deficit  Rationale for Evaluation and Treatment: Rehabilitation  SUBJECTIVE:   SUBJECTIVE STATEMENT: I don't have any pain right now, but my Rt shoulder and back hurt at times  Pt accompanied by:  wife, Jordan [name]  PERTINENT HISTORY: "Jordan [name] is an 82 year old man who presents for follow-up after hip fracture.    1) Left hip fracture:  -not walking much, but can walk with rolling walker I condominium unit.  -his wife  accompanies him today and provides most of history -his walking discharge is very short.  -no longer painful -has weakness in bilateral lower extremities -no more falls.    2) Parkinson's: -sometimes muscles don't cooperate the way they should. -he has vascular parkinsonism -has stooped posture   3) Right leg weakness: -when walking.  -wife is nervous about him falling -he was in skilled rehab and was discharged June 8th  -they have not yet set up outpatient therapy -he has not had any professional therapy since discharge on June 8th.    4) 6 bottom teeth removed -has more of a challenge in forming words.    5) Neuroendocrine tumor -being treated with monthly octreotide -no current symptoms currently -was having persistent abdominal pain- they thought it was a flare of diverticulitis but this was not present -CBGs increase because of octreotide- CBGs as high as 250/260 -he asks for icecream   He was  diagnosed with a vascular Parkinsonism in Jan 2022 and has history of CVA in June 2021 per pt report and spouse. Pt fell and sustained hip fx on 02/08/22 and pt underwent left  IM nailing.  04/30/22:  hospitalized with blood clot LLE--changed meds and cleared to resume OT/PT."  PRECAUTIONS: Fall  WEIGHT BEARING RESTRICTIONS: No  PAIN:  Are you having pain? No  FALLS: Has patient fallen in last 6 months? No  Lives with: lives with their spouse Lives in: Other Condo Stairs:  On the 10th floor, there are 2 elevators  Has following equipment at home: Dan Humphreys - 2 wheeled, shower chair, Grab bars, and transport chair   PLOF: Independent with household mobility with device and Needs assistance with ADLs Wife helps with ADLs, pt can get dressed and bathe himself it just takes longer. "Socks take forever"   PATIENT GOALS: Improve UE functional use  OBJECTIVE:   HAND DOMINANCE: Right  ADLs: Overall ADLs: min to moderate assistance (requires more assistance due to time  constraints)  IADLs: Handwriting: 25% legible, Increased time, and Severe micrographia  MOBILITY STATUS: Needs Assist: uses transport chair, dependent for navigation  ACTIVITY TOLERANCE: Activity tolerance: fair  FUNCTIONAL OUTCOME MEASURES: PSFS:  COORDINATION: Box and Blocks:  Right 31 blocks, Left 34 blocks  UE ROM:  WFL  UE MMT:   WFL  SENSATION: Reports paresthesias in R hand all fingers  MUSCLE TONE: not fully tested this visit  COGNITION: Overall cognitive status: Impaired  OBSERVATIONS: Bradykinesia and Postural tremors   TODAY'S TREATMENT:                                                                                                                               Pt reports occasional neck pain and stiffness in neck. Issued neck ex's to perform supine including: neck rotation and retraction - see pt instructions. Discussed moving bigger w/ head to look to Rt and Lt. Also discussed twisting upper trunk and looking over shoulder to get seatbelt and doff jacket.   Reviewed PD ex chart and memory notebook - pt's wife had organized well however stressed importance of pt actively doing/using, and benefits of daily exercise  Issued and discussed ways to prevent future PD related complications and ADL strategies - see pt instructions. Specifically reviewed strategies for cutting food and opening/closing twist lids - pt practiced w/ PB jar  PATIENT EDUCATION: Education details: neck ex's, ways to prevent future PD related complications, ADL strategies  Person educated: Patient and Spouse Education method: Explanation, Demonstration, and Handouts Education comprehension: verbalized understanding, returned demonstration, and needs further education  HOME EXERCISE PROGRAM: 5/9: pen tricks 03/22/23: review and reprint of coordination HEP, memory strategies, thinking skills, PD ex chart from 06/2022 03/29/23: PWR! Seated, handwriting strategies 04/05/23: neck ex's, ways to  prevent future PD related complications, ADL strategies   GOALS:  SHORT TERM GOALS: Target date: 03/11/2023    Pt will be independent with PD  specific HEP.  Baseline: not yet initiated Goal status: ONGOING  2.  Pt will verbalize understanding of adapted strategies to maximize safety and independence with ADLs/IADLs.  Baseline: not yet initiated Goal status: IN PROGRESS  3.  Pt will write a sentence with no significant decrease in size and maintain 50% legibility.  Baseline: 25% legibility Goal status: INITIAL  4.  Will assess 9 hole peg and set appropriate LTG.  Baseline:  Goal status: INITIAL  5.  Will assess 3 button/button time and set appropriate LTG.  Baseline:  Goal status: INITIAL  6.  Will assess PPT#4 (don/doff jacket and set appropriate goal) Baseline:  Goal status: INITIAL  LONG TERM GOALS: Target date: 04/22/2023    Pt will be able to place at least 35 blocks using each hand with completion of Box and Blocks test. Baseline: Right 31 blocks, Left 34 blocks Goal status: INITIAL  2.  Patient will report at least two-point increase in average PSFS score or at least three-point increase in a single activity score indicating functionally significant improvement given minimum detectable change. Baseline: 3 total score (see above for single activity scores) Goal status: INITIAL  3.  Pt will verbalize understanding of ways to prevent future PD related complications and PD community resources.  Baseline: not yet initiated Goal status: PARTIALLY MET (issued first part)   4.  Pt will write a sentence with no significant decrease in size and maintain 75% legibility.  Baseline: 25% legibility Goal status: INITIAL   ASSESSMENT:  CLINICAL IMPRESSION: Pt and spouse seem motivated to participate in therapy and have well organized notebook, however question how much pt is actively participating in HEP's at home PERFORMANCE DEFICITS: in functional skills including  ADLs, IADLs, coordination, sensation, strength, pain, Fine motor control, Gross motor control, mobility, balance, endurance, decreased knowledge of use of DME, and UE functional use, cognitive skills including attention, memory, problem solving, and safety awareness.  IMPAIRMENTS: are limiting patient from ADLs, IADLs, rest and sleep, leisure, and social participation.   COMORBIDITIES:  may have co-morbidities  that affects occupational performance. Patient will benefit from skilled OT to address above impairments and improve overall function.  REHAB POTENTIAL: Fair given PD prognosis   PLAN:  OT FREQUENCY: 2x/week  OT DURATION: 10 weeks  PLANNED INTERVENTIONS: self care/ADL training, therapeutic exercise, therapeutic activity, manual therapy, passive range of motion, balance training, functional mobility training, splinting, ultrasound, paraffin, fluidotherapy, moist heat, patient/family education, cognitive remediation/compensation, energy conservation, coping strategies training, DME and/or AE instructions, and Re-evaluation  RECOMMENDED OTHER SERVICES: none at this time  CONSULTED AND AGREED WITH PLAN OF CARE: Patient and family member/caregiver  PLAN FOR NEXT SESSION: assess STG's 4-6, work on Statistician and jacket (pt to bring in jacket)    Sheran Lawless, OT 04/05/2023, 2:16 PM

## 2023-04-06 NOTE — Therapy (Signed)
OUTPATIENT OCCUPATIONAL THERAPY [facility]  Patient Name: Jordan [name] MRN: [id number] DOB:July 06, 1941, 82 y.o., male Today's Date: 04/06/2023  PCP: Jordan Blamer, MD  REFERRING PROVIDER: Horton Chin, MD  END OF SESSION:      Past Medical History:  Diagnosis Date   Arthritis    R shoulder, bone spur   Arthritis    "hands" (03/25/2016)   Chronic diastolic heart failure (HCC)    Chronic lower back pain    Coronary heart disease    Dr Jordan [name]   Diverticulitis 12/2021   DVT (deep venous thrombosis) (HCC) 05/2022   left leg   H/O cardiovascular stress test    perhaps last one was 2009   Heart murmur    12/29/11 echo: mild MR, no AS, trivial TR   History of gout    Hyperlipidemia    Hypertension    saw Dr. Mendel [name] last earl;y- 2014, cardiac cath. last done ?2009, blocks seen didn't require any intervention at that point.   Narcolepsy    [name] 04-11-97; Mean Latency 1.69min, SOREM 2   OSA on CPAP    NPSG 12-13-98 AHI 22.7   PONV (postoperative nausea and vomiting)    Presence of permanent cardiac pacemaker    Shortness of breath    with exertion    Stroke (HCC)    Type II diabetes mellitus (HCC)    Past Surgical History:  Procedure Laterality Date   CARDIAC CATHETERIZATION  2009?   EP IMPLANTABLE DEVICE N/A 03/25/2016   Procedure: Pacemaker Implant - Dual Chamber;  Surgeon: Jordan Maw, MD;  Location: [facility] INVASIVE CV LAB;  Service: Cardiovascular;  Laterality: N/A;   FRACTURE SURGERY     HIP FRACTURE SURGERY     INSERT / REPLACE / REMOVE PACEMAKER  03/24/2016   INTRAMEDULLARY (IM) NAIL INTERTROCHANTERIC Left 02/08/2022   Procedure: INTRAMEDULLARY (IM) NAIL INTERTROCHANTRIC;  Surgeon: Jordan Copa, MD;  Location: [facility] OR;  Service: Orthopedics;  Laterality: Left;   IR KYPHO LUMBAR INC FX REDUCE BONE BX UNI/BIL CANNULATION INC/IMAGING  12/29/2021   IR RADIOLOGIST EVAL & MGMT  12/15/2021   IR RADIOLOGY PERIPHERAL GUIDED IV START  07/14/2022    IR US GUIDE VASC ACCESS RIGHT  07/15/2022   LEFT AND RIGHT HEART CATHETERIZATION WITH CORONARY ANGIOGRAM N/A 06/05/2014   Procedure: LEFT AND RIGHT HEART CATHETERIZATION WITH CORONARY ANGIOGRAM;  Surgeon: Jordan Noe, MD;  Location: [facility] CATH LAB;  Service: Cardiovascular;  Laterality: N/A;   NASAL FRACTURE SURGERY     SHOULDER ARTHROSCOPY WITH ROTATOR CUFF REPAIR AND SUBACROMIAL DECOMPRESSION Right 08/16/2013   Procedure: SHOULDER ARTHROSCOPY WITH ROTATOR CUFF REPAIR AND SUBACROMIAL DECOMPRESSION;  Surgeon: Jordan Paris, MD;  Location: [facility] OR;  Service: Orthopedics;  Laterality: Right;  Right shoulder arthroscopy rotator cuff repair, subacromial decompression.   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   Acute deep vein thrombosis (DVT) of left lower extremity (HCC) 04/29/2022   Closed intertrochanteric fracture of left hip, initial encounter [facility])    Closed left hip fracture (HCC) secondary to fall at home 02/08/2022   Paroxysmal atrial fibrillation (HCC) 02/08/2022   Debility 01/08/2022   Constipation 01/07/2022   Physical deconditioning 01/07/2022   Acute metabolic encephalopathy 01/04/2022   Hypomagnesemia 01/04/2022   Pancreatitis 12/30/2021   Parkinson's disease 12/21/2021   Parkinson disease 12/18/2021   Frequent falls 12/18/2021   History of  CVA (cerebrovascular accident) 12/18/2021   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   Hallucinations, visual 07/30/2021   Acetabulum fracture, left (HCC) 06/25/2021   Acute hip pain, left 06/24/2021   Fall at home, initial encounter 06/24/2021   Leukocytosis 06/24/2021   Chronic diastolic heart failure (HCC)    Sinusitis, acute maxillary 06/24/2020   AKI (acute kidney injury) (HCC)    Labile blood glucose    Cough    Benign essential HTN    Skin lesion of hand    Hypoalbuminemia due to protein-calorie malnutrition (HCC)     Controlled type 2 diabetes mellitus with hyperglycemia, without long-term current use of insulin (HCC)    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   Morbid obesity (HCC)    Acute blood loss anemia    Acute thalamic infarction (HCC) 04/15/2020   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   TIA (transient ischemic attack) 02/17/2018   Type 2 diabetes mellitus with hyperlipidemia (HCC) 02/17/2018   Insomnia 02/01/2018   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   Chronotropic incompetence 07/09/2014   Dyspnea on exertion 03/03/2014   HLD (hyperlipidemia) 12/13/2007   Essential hypertension 12/13/2007   Class 1 obesity 12/05/2007   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  Rationale for Evaluation and Treatment: Rehabilitation  SUBJECTIVE:   SUBJECTIVE STATEMENT: I don't have any pain right now, but my Rt shoulder and back hurt at times  Pt accompanied by:  wife, Jordan [name]  PERTINENT HISTORY: "[name] is an 82 year old man who presents for follow-up after hip fracture.    1) Left hip fracture:  -not walking much, but can walk with rolling walker I condominium unit.  -his wife accompanies him today and provides most of history -his walking discharge is very short.  -no longer painful -has weakness in bilateral lower extremities -no more falls.    2) Parkinson's: -sometimes muscles don't cooperate the way they should. -he has vascular parkinsonism -has stooped posture   3) Right leg weakness: -when walking.  -wife is nervous about him falling -he was in skilled rehab and was discharged June 8th  -they have not yet set up outpatient therapy -he has not had any professional therapy since  discharge on June 8th.    4) 6 bottom teeth removed -has more of a challenge in forming words.    5) Neuroendocrine tumor -being treated with monthly octreotide -no current symptoms currently -was having persistent abdominal pain- they thought it was a flare of diverticulitis but this was not present -CBGs increase because of octreotide- CBGs as high as 250/260 -he asks for icecream   He was  diagnosed with a vascular Parkinsonism in Jan 2022 and has history of CVA in June 2021 per pt report and spouse. Pt fell and sustained hip fx on 02/08/22 and pt underwent left  IM nailing.  04/30/22:  hospitalized with blood clot LLE--changed meds and cleared to resume OT/PT."  PRECAUTIONS: Fall  WEIGHT BEARING RESTRICTIONS: No  PAIN:  Are you having pain? No  FALLS: Has patient fallen in last 6 months? No  Lives with: lives with their spouse Lives in: Other Condo Stairs:  On the 10th floor, there are 2 elevators  Has following equipment at home: Dan Humphreys - 2 wheeled, shower chair, Grab bars, and transport chair  PLOF: Independent with household mobility with device and Needs assistance with ADLs Wife helps with ADLs, pt can get dressed and bathe himself it just takes longer. "Socks take forever"   PATIENT GOALS: Improve UE functional use  OBJECTIVE:   HAND DOMINANCE: Right  ADLs: Overall ADLs: min to moderate assistance (requires more assistance due to time constraints)  IADLs: Handwriting: 25% legible, Increased time, and Severe micrographia  MOBILITY STATUS: Needs Assist: uses transport chair, dependent for navigation  ACTIVITY TOLERANCE: Activity tolerance: fair  FUNCTIONAL OUTCOME MEASURES: PSFS:  COORDINATION: Box and Blocks:  Right 31 blocks, Left 34 blocks  UE ROM:  WFL  UE MMT:   WFL  SENSATION: Reports paresthesias in R hand all fingers  MUSCLE TONE: not fully tested this visit  COGNITION: Overall cognitive status: Impaired  OBSERVATIONS: Bradykinesia and  Postural tremors   TODAY'S TREATMENT:                                                                                                                               Pt reports occasional neck pain and stiffness in neck. Issued neck ex's to perform supine including: neck rotation and retraction - see pt instructions. Discussed moving bigger w/ head to look to Rt and Lt. Also discussed twisting upper trunk and looking over shoulder to get seatbelt and doff jacket.   Reviewed PD ex chart and memory notebook - pt's wife had organized well however stressed importance of pt actively doing/using, and benefits of daily exercise  Issued and discussed ways to prevent future PD related complications and ADL strategies - see pt instructions. Specifically reviewed strategies for cutting food and opening/closing twist lids - pt practiced w/ PB jar  PATIENT EDUCATION: Education details: neck ex's, ways to prevent future PD related complications, ADL strategies  Person educated: Patient and Spouse Education method: Explanation, Demonstration, and Handouts Education comprehension: verbalized understanding, returned demonstration, and needs further education  HOME EXERCISE PROGRAM: 5/9: pen tricks 03/22/23: review and reprint of coordination HEP, memory strategies, thinking skills, PD ex chart from 06/2022 03/29/23: PWR! Seated, handwriting strategies 04/05/23: neck ex's, ways to prevent future PD related complications, ADL strategies   GOALS:  SHORT TERM GOALS: Target date: 03/11/2023    Pt will be independent with PD specific HEP.  Baseline: not yet initiated Goal status: ONGOING  2.  Pt will verbalize understanding of adapted strategies to maximize safety and independence with ADLs/IADLs.  Baseline: not yet initiated Goal status: IN PROGRESS  3.  Pt will write a sentence with no significant decrease in size and maintain 50% legibility.  Baseline: 25% legibility Goal status: INITIAL  4.  Will  assess 9 hole peg and set appropriate LTG.  Baseline:  Goal status: INITIAL  5.  Will assess 3 button/button time and set appropriate LTG.  Baseline:  Goal status: INITIAL  6.  Will assess PPT#4 (don/doff jacket and set appropriate goal) Baseline:  Goal status: INITIAL  LONG  TERM GOALS: Target date: 04/22/2023    Pt will be able to place at least 35 blocks using each hand with completion of Box and Blocks test. Baseline: Right 31 blocks, Left 34 blocks Goal status: INITIAL  2.  Patient will report at least two-point increase in average PSFS score or at least three-point increase in a single activity score indicating functionally significant improvement given minimum detectable change. Baseline: 3 total score (see above for single activity scores) Goal status: INITIAL  3.  Pt will verbalize understanding of ways to prevent future PD related complications and PD community resources.  Baseline: not yet initiated Goal status: PARTIALLY MET (issued first part)   4.  Pt will write a sentence with no significant decrease in size and maintain 75% legibility.  Baseline: 25% legibility Goal status: INITIAL   ASSESSMENT:  CLINICAL IMPRESSION: Pt and spouse seem motivated to participate in therapy and have well organized notebook, however question how much pt is actively participating in HEP's at home PERFORMANCE DEFICITS: in functional skills including ADLs, IADLs, coordination, sensation, strength, pain, Fine motor control, Gross motor control, mobility, balance, endurance, decreased knowledge of use of DME, and UE functional use, cognitive skills including attention, memory, problem solving, and safety awareness.  IMPAIRMENTS: are limiting patient from ADLs, IADLs, rest and sleep, leisure, and social participation.   COMORBIDITIES:  may have co-morbidities  that affects occupational performance. Patient will benefit from skilled OT to address above impairments and improve overall  function.  REHAB POTENTIAL: Fair given PD prognosis   PLAN:  OT FREQUENCY: 2x/week  OT DURATION: 10 weeks  PLANNED INTERVENTIONS: self care/ADL training, therapeutic exercise, therapeutic activity, manual therapy, passive range of motion, balance training, functional mobility training, splinting, ultrasound, paraffin, fluidotherapy, moist heat, patient/family education, cognitive remediation/compensation, energy conservation, coping strategies training, DME and/or AE instructions, and Re-evaluation  RECOMMENDED OTHER SERVICES: none at this time  CONSULTED AND AGREED WITH PLAN OF CARE: Patient and family member/caregiver  PLAN FOR NEXT SESSION: assess STG's 4-6, work on Statistician and jacket (pt to bring in jacket)    Delana Meyer, OT 04/06/2023, 5:32 PM

## 2023-04-07 ENCOUNTER — Ambulatory Visit: Payer: Medicare (Managed Care) | Admitting: Occupational Therapy

## 2023-04-07 ENCOUNTER — Ambulatory Visit: Payer: Medicare (Managed Care) | Admitting: Physical Therapy

## 2023-04-07 DIAGNOSIS — M6281 Muscle weakness (generalized): Secondary | ICD-10-CM

## 2023-04-07 DIAGNOSIS — R251 Tremor, unspecified: Secondary | ICD-10-CM

## 2023-04-07 DIAGNOSIS — R278 Other lack of coordination: Secondary | ICD-10-CM

## 2023-04-07 DIAGNOSIS — R293 Abnormal posture: Secondary | ICD-10-CM

## 2023-04-07 NOTE — Therapy (Signed)
OUTPATIENT OCCUPATIONAL THERAPY [facility]  Patient Name: Jordan [name] MRN: [id number] DOB:29-Jul-1941, 82 y.o., male Today's Date: 04/07/2023  PCP: Jordan Blamer, MD  REFERRING PROVIDER: Horton Chin, MD  END OF SESSION:  OT End of Session - 04/07/23 1241     Visit Number 8    Number of Visits 21    Date for OT Re-Evaluation 04/22/23    Authorization Type [employer] - [place], no auth required    Progress Note Due on Visit 10    OT Start Time 1239    OT Stop Time 1317    OT Time Calculation (min) 38 min    Activity Tolerance Patient tolerated treatment well    Behavior During Therapy WFL for tasks assessed/performed                Past Medical History:  Diagnosis Date   Arthritis    R shoulder, bone spur   Arthritis    "hands" (03/25/2016)   Chronic diastolic heart failure (HCC)    Chronic lower back pain    Coronary heart disease    Dr Jordan [name]   Diverticulitis 12/2021   DVT (deep venous thrombosis) (HCC) 05/2022   left leg   H/O cardiovascular stress test    perhaps last one was 2009   Heart murmur    12/29/11 echo: mild MR, no AS, trivial TR   History of gout    Hyperlipidemia    Hypertension    saw Dr. Mendel [name] last earl;y- 2014, cardiac cath. last done ?2009, blocks seen didn't require any intervention at that point.   Narcolepsy    [name] 04-11-97; Mean Latency 1.65min, SOREM 2   OSA on CPAP    NPSG 12-13-98 AHI 22.7   PONV (postoperative nausea and vomiting)    Presence of permanent cardiac pacemaker    Shortness of breath    with exertion    Stroke (HCC)    Type II diabetes mellitus (HCC)    Past Surgical History:  Procedure Laterality Date   CARDIAC CATHETERIZATION  2009?   EP IMPLANTABLE DEVICE N/A 03/25/2016   Procedure: Pacemaker Implant - Dual Chamber;  Surgeon: Jordan Maw, MD;  Location: [facility] INVASIVE CV LAB;  Service: Cardiovascular;  Laterality: N/A;   FRACTURE SURGERY     HIP FRACTURE SURGERY     INSERT /  REPLACE / REMOVE PACEMAKER  03/24/2016   INTRAMEDULLARY (IM) NAIL INTERTROCHANTERIC Left 02/08/2022   Procedure: INTRAMEDULLARY (IM) NAIL INTERTROCHANTRIC;  Surgeon: Jordan Copa, MD;  Location: [facility] OR;  Service: Orthopedics;  Laterality: Left;   IR KYPHO LUMBAR INC FX REDUCE BONE BX UNI/BIL CANNULATION INC/IMAGING  12/29/2021   IR RADIOLOGIST EVAL & MGMT  12/15/2021   IR RADIOLOGY PERIPHERAL GUIDED IV START  07/14/2022   IR US GUIDE VASC ACCESS RIGHT  07/15/2022   LEFT AND RIGHT HEART CATHETERIZATION WITH CORONARY ANGIOGRAM N/A 06/05/2014   Procedure: LEFT AND RIGHT HEART CATHETERIZATION WITH CORONARY ANGIOGRAM;  Surgeon: Jordan Noe, MD;  Location: [facility] CATH LAB;  Service: Cardiovascular;  Laterality: N/A;   NASAL FRACTURE SURGERY     SHOULDER ARTHROSCOPY WITH ROTATOR CUFF REPAIR AND SUBACROMIAL DECOMPRESSION Right 08/16/2013   Procedure: SHOULDER ARTHROSCOPY WITH ROTATOR CUFF REPAIR AND SUBACROMIAL DECOMPRESSION;  Surgeon: Jordan Paris, MD;  Location: [facility] OR;  Service: Orthopedics;  Laterality: Right;  Right shoulder arthroscopy rotator cuff repair, subacromial decompression.   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   Acute deep vein thrombosis (DVT) of left lower extremity (HCC) 04/29/2022   Closed intertrochanteric fracture of left hip, initial encounter [facility])    Closed left hip fracture (HCC) secondary to fall at home 02/08/2022   Paroxysmal atrial fibrillation (HCC) 02/08/2022   Debility 01/08/2022   Constipation 01/07/2022   Physical deconditioning 01/07/2022   Acute metabolic encephalopathy 01/04/2022   Hypomagnesemia 01/04/2022   Pancreatitis 12/30/2021   Parkinson's disease 12/21/2021   Parkinson disease 12/18/2021   Frequent falls 12/18/2021   History of CVA (cerebrovascular accident) 12/18/2021   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   Hallucinations,  visual 07/30/2021   Acetabulum fracture, left (HCC) 06/25/2021   Acute hip pain, left 06/24/2021   Fall at home, initial encounter 06/24/2021   Leukocytosis 06/24/2021   Chronic diastolic heart failure (HCC)    Sinusitis, acute maxillary 06/24/2020   AKI (acute kidney injury) (HCC)    Labile blood glucose    Cough    Benign essential HTN    Skin lesion of hand    Hypoalbuminemia due to protein-calorie malnutrition (HCC)    Controlled type 2 diabetes mellitus with hyperglycemia, without long-term current use of insulin (HCC)    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   Morbid obesity (HCC)    Acute blood loss anemia    Acute thalamic infarction (HCC) 04/15/2020   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   TIA (transient ischemic attack) 02/17/2018   Type 2 diabetes mellitus with hyperlipidemia (HCC) 02/17/2018   Insomnia 02/01/2018   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   Chronotropic incompetence 07/09/2014   Dyspnea on exertion 03/03/2014   HLD (hyperlipidemia) 12/13/2007   Essential hypertension 12/13/2007   Class 1 obesity 12/05/2007   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 He has not tried buttons in a long time.   Pt accompanied by:  wife, Jordan [name]  PERTINENT HISTORY: "[name] is an 82 year old man who presents for follow-up after hip fracture.    1) Left hip fracture:  -not walking much, but can walk with rolling walker I condominium unit.  -his wife  accompanies him today and provides most of history -his walking discharge is very short.  -no longer painful -has weakness in bilateral lower extremities -no more falls.    2) Parkinson's: -sometimes muscles don't cooperate the way they should. -he has vascular parkinsonism -has stooped posture   3) Right leg weakness: -when walking.  -wife is nervous about him falling -he was in skilled rehab and was discharged June 8th  -they have not yet set up outpatient therapy -he has not had any professional therapy since discharge on June 8th.    4) 6 bottom teeth removed -has more of a challenge in forming words.    5) Neuroendocrine tumor -being treated with monthly octreotide -no current symptoms currently -was having persistent abdominal pain- they thought it was a flare of diverticulitis but this was not present -CBGs increase because of octreotide- CBGs as high as 250/260 -he asks for icecream  He was  diagnosed with a vascular Parkinsonism in Jan 2022 and has history of CVA in June 2021 per pt report and spouse. Pt fell and sustained hip fx on 02/08/22 and pt underwent left  IM nailing.  04/30/22:  hospitalized with blood clot LLE--changed meds and cleared to resume OT/PT."  PRECAUTIONS: Fall  WEIGHT BEARING RESTRICTIONS: No  PAIN:  Are you having pain? No  FALLS: Has patient fallen in last 6 months? No  Lives with: lives with their spouse Lives in: Other Condo Stairs:  On the 10th floor, there are 2 elevators  Has following equipment at home: Dan Humphreys - 2 wheeled, shower chair, Grab bars, and transport chair   PLOF: Independent with household mobility with device and Needs assistance with ADLs Wife helps with ADLs, pt can get dressed and bathe himself it just takes longer. "Socks take forever"   PATIENT GOALS: Improve UE functional use  OBJECTIVE:   HAND DOMINANCE: Right  ADLs: Overall ADLs: min to moderate assistance (requires more assistance due to time  constraints)  IADLs: Handwriting: 25% legible, Increased time, and Severe micrographia  MOBILITY STATUS: Needs Assist: uses transport chair, dependent for navigation  ACTIVITY TOLERANCE: Activity tolerance: fair  FUNCTIONAL OUTCOME MEASURES: PSFS:  COORDINATION: Box and Blocks:  Right 31 blocks, Left 34 blocks  UE ROM:  WFL  UE MMT:   WFL  SENSATION: Reports paresthesias in R hand all fingers  MUSCLE TONE: not fully tested this visit  COGNITION: Overall cognitive status: Impaired  OBSERVATIONS: Bradykinesia and Postural tremors   TODAY'S TREATMENT:                                                                                                                              - Therapeutic activities completed for duration as noted below including: Objective measures assessed as noted in Goals section to determine progression towards goals.  OT educating pt on adaptive strategies for buttoning and pegs including PWR! UP hands and angry buttons.   PATIENT EDUCATION: Education details: adaptive strategies  Person educated: Patient and Spouse Education method: Explanation, Demonstration, and Handouts Education comprehension: verbalized understanding, returned demonstration, and needs further education  HOME EXERCISE PROGRAM: 5/9: pen tricks 03/22/23: review and reprint of coordination HEP, memory strategies, thinking skills, PD ex chart from 06/2022 03/29/23: PWR! Seated, handwriting strategies 04/05/23: neck ex's, ways to prevent future PD related complications, ADL strategies   GOALS:  SHORT TERM GOALS: Target date: 03/11/2023    Pt will be independent with PD specific HEP.  Baseline: not yet initiated Goal status: ONGOING  2.  Pt will verbalize understanding of adapted strategies to maximize safety and independence with ADLs/IADLs.  Baseline: not yet initiated Goal status: IN PROGRESS  3.  Pt will write a sentence with no significant decrease in size and maintain  50% legibility.  Baseline: 25% legibility Goal status: INITIAL  4.  Will assess 9 hole peg and set appropriate LTG.  Baseline:  04/07/2023 R- 44 seconds; 42 seconds; 41 seconds: L- 46 seconds; 46 seconds Goal status: INITIAL  5.  Will assess 3 button/button time and set appropriate LTG.  Baseline:  04/07/2023 - able to button 3 buttons in 3 minutes - improved to 92 seconds with cues for PWR! Hands and angry button technique Goal status: INITIAL  6.  Will assess PPT#4 (don/doff jacket and set appropriate goal) Baseline:  Goal status: INITIAL  LONG TERM GOALS: Target date: 04/22/2023    Pt will be able to place at least 35 blocks using each hand with completion of Box and Blocks test. Baseline: Right 31 blocks, Left 34 blocks Goal status: INITIAL  2.  Patient will report at least two-point increase in average PSFS score or at least three-point increase in a single activity score indicating functionally significant improvement given minimum detectable change. Baseline: 3 total score (see above for single activity scores) Goal status: INITIAL  3.  Pt will verbalize understanding of ways to prevent future PD related complications and PD community resources.  Baseline: not yet initiated Goal status: PARTIALLY MET (issued first part)   4.  Pt will write a sentence with no significant decrease in size and maintain 75% legibility.  Baseline: 25% legibility Goal status: INITIAL   ASSESSMENT:  CLINICAL IMPRESSION: Pt remains willing to participate in skilled OT but requires extensive cueing to incorporate PD strategies into completion of functional tasks as needed to improve independence and safety with completion outside of clinic.  PERFORMANCE DEFICITS: in functional skills including ADLs, IADLs, coordination, sensation, strength, pain, Fine motor control, Gross motor control, mobility, balance, endurance, decreased knowledge of use of DME, and UE functional use, cognitive skills  including attention, memory, problem solving, and safety awareness.  IMPAIRMENTS: are limiting patient from ADLs, IADLs, rest and sleep, leisure, and social participation.   COMORBIDITIES:  may have co-morbidities  that affects occupational performance. Patient will benefit from skilled OT to address above impairments and improve overall function.  REHAB POTENTIAL: Fair given PD prognosis   PLAN:  OT FREQUENCY: 2x/week  OT DURATION: 10 weeks  PLANNED INTERVENTIONS: self care/ADL training, therapeutic exercise, therapeutic activity, manual therapy, passive range of motion, balance training, functional mobility training, splinting, ultrasound, paraffin, fluidotherapy, moist heat, patient/family education, cognitive remediation/compensation, energy conservation, coping strategies training, DME and/or AE instructions, and Re-evaluation  RECOMMENDED OTHER SERVICES: none at this time  CONSULTED AND AGREED WITH PLAN OF CARE: Patient and family member/caregiver  PLAN FOR NEXT SESSION: assess STG 6, work on donning/doffing shirt and jacket (pt to bring in jacket)    Delana Meyer, OT 04/07/2023, 5:56 PM

## 2023-04-12 ENCOUNTER — Encounter
Payer: Medicare (Managed Care) | Attending: Physical Medicine and Rehabilitation | Admitting: Physical Medicine and Rehabilitation

## 2023-04-12 ENCOUNTER — Ambulatory Visit: Payer: Medicare (Managed Care) | Admitting: Occupational Therapy

## 2023-04-12 VITALS — BP 118/75 | HR 68 | Ht 68.0 in | Wt 211.0 lb

## 2023-04-12 DIAGNOSIS — R251 Tremor, unspecified: Secondary | ICD-10-CM

## 2023-04-12 DIAGNOSIS — Z789 Other specified health status: Secondary | ICD-10-CM | POA: Diagnosis not present

## 2023-04-12 DIAGNOSIS — G20A1 Parkinson's disease without dyskinesia, without mention of fluctuations: Secondary | ICD-10-CM | POA: Diagnosis not present

## 2023-04-12 DIAGNOSIS — E1169 Type 2 diabetes mellitus with other specified complication: Secondary | ICD-10-CM | POA: Insufficient documentation

## 2023-04-12 DIAGNOSIS — R498 Other voice and resonance disorders: Secondary | ICD-10-CM | POA: Diagnosis not present

## 2023-04-12 DIAGNOSIS — M545 Low back pain, unspecified: Secondary | ICD-10-CM | POA: Diagnosis not present

## 2023-04-12 DIAGNOSIS — G4752 REM sleep behavior disorder: Secondary | ICD-10-CM | POA: Insufficient documentation

## 2023-04-12 DIAGNOSIS — R5383 Other fatigue: Secondary | ICD-10-CM | POA: Diagnosis not present

## 2023-04-12 DIAGNOSIS — R293 Abnormal posture: Secondary | ICD-10-CM | POA: Diagnosis not present

## 2023-04-12 DIAGNOSIS — Z7409 Other reduced mobility: Secondary | ICD-10-CM | POA: Diagnosis not present

## 2023-04-12 DIAGNOSIS — E785 Hyperlipidemia, unspecified: Secondary | ICD-10-CM | POA: Insufficient documentation

## 2023-04-12 DIAGNOSIS — R41840 Attention and concentration deficit: Secondary | ICD-10-CM

## 2023-04-12 DIAGNOSIS — R278 Other lack of coordination: Secondary | ICD-10-CM

## 2023-04-12 DIAGNOSIS — M6281 Muscle weakness (generalized): Secondary | ICD-10-CM

## 2023-04-12 DIAGNOSIS — S72002D Fracture of unspecified part of neck of left femur, subsequent encounter for closed fracture with routine healing: Secondary | ICD-10-CM | POA: Insufficient documentation

## 2023-04-12 DIAGNOSIS — Z8673 Personal history of transient ischemic attack (TIA), and cerebral infarction without residual deficits: Secondary | ICD-10-CM | POA: Diagnosis not present

## 2023-04-12 NOTE — Progress Notes (Signed)
Subjective:    Patient ID: Jordan [name], male    DOB: 15-Nov-1940, 82 y.o.   MRN: [id number]  HPI Jordan [name] is an 82 year old man who presents for follow-up after hip fracture.   1) Left hip fracture:  -not walking much, but can walk with rolling walker I condominium unit.  -his wife accompanies him today and provides most of history -his walking discharge is very short.  -no longer painful -has weakness in bilateral lower extremities -no more falls.  -no more pain  2) Parkinson's: -sometimes muscles don't cooperate the way they should. -he has vascular parkinsonism -has stooped posture -walking better -graduated from PT -spoke to a woman about a program at [employer]  3) Right leg weakness: -when walking.  -wife is nervous about him falling -he was in skilled rehab and was discharged June 8th  -they have not yet set up outpatient therapy -he has not had any professional therapy since discharge on June 8th.   4) 6 bottom teeth removed -has more of a challenge in forming words.   5) Neuroendocrine tumor -being treated with monthly octreotide -no current symptoms currently -was having persistent abdominal pain- they thought it was a flare of diverticulitis but this was not present -CBGs increase because of octreotide- CBGs as high as 250/260 -he asks for icecream  6) Low back pain -about 4/10 currently  Pain Inventory Average Pain 2 Pain Right Now 0 My pain is dull  LOCATION OF PAIN  back and lower left hip  BOWEL Number of stools per week: 1-2 Oral laxative use Yes  Type of laxative Miralax, senna plus   BLADDER Pads Bladder incontinence Yes  Frequent urination No  Leakage with coughing No  Difficulty starting stream No  Incomplete bladder emptying No    Mobility walk with assistance use a walker how many minutes can you walk? 1-2 ability to climb steps?  no do you drive?  no use a wheelchair needs help with transfers Do you have any goals  in this area?  yes  Function retired  Neuro/Psych bladder control problems weakness tremor trouble walking dizziness loss of taste or smell  Prior Studies Any changes since last visit?  no  Physicians involved in your care Any changes since last visit?  no   Family History  Problem Relation Age of Onset   Sleep apnea Sister    Colon cancer Sister    Social History   Socioeconomic History   Marital status: Married    Spouse name: [name]   Number of children: Not on file   Years of education: Not on file   Highest education level: Not on file  Occupational History   Occupation: [employer]   Occupation: [place] [employer]  Tobacco Use   Smoking status: Former    Packs/day: 2.50    Years: 6.00    Additional pack years: 0.00    Total pack years: 15.00    Types: Cigarettes    Quit date: 06/05/1967    Years since quitting: 55.8   Smokeless tobacco: Never  Vaping Use   Vaping Use: Never used  Substance and Sexual Activity   Alcohol use: Yes    Comment: rare   Drug use: No   Sexual activity: Not Currently  Other Topics Concern   Not on file  Social History Narrative   Lives with wife and son   Right handed   Drinks 1 cups caffeine daily   Social Determinants of Health  Financial Resource Strain: Not on file  Food Insecurity: Not on file  Transportation Needs: Not on file  Physical Activity: Not on file  Stress: Not on file  Social Connections: Not on file   Past Surgical History:  Procedure Laterality Date   CARDIAC CATHETERIZATION  2009?   EP IMPLANTABLE DEVICE N/A 03/25/2016   Procedure: Pacemaker Implant - Dual Chamber;  Surgeon: Marinus Maw, MD;  Location: [facility] INVASIVE CV LAB;  Service: Cardiovascular;  Laterality: N/A;   FRACTURE SURGERY     HIP FRACTURE SURGERY     INSERT / REPLACE / REMOVE PACEMAKER  03/24/2016   INTRAMEDULLARY (IM) NAIL INTERTROCHANTERIC Left 02/08/2022   Procedure: INTRAMEDULLARY (IM) NAIL INTERTROCHANTRIC;   Surgeon: Cammy Copa, MD;  Location: [facility] OR;  Service: Orthopedics;  Laterality: Left;   IR KYPHO LUMBAR INC FX REDUCE BONE BX UNI/BIL CANNULATION INC/IMAGING  12/29/2021   IR RADIOLOGIST EVAL & MGMT  12/15/2021   IR RADIOLOGY PERIPHERAL GUIDED IV START  07/14/2022   IR US GUIDE VASC ACCESS RIGHT  07/15/2022   LEFT AND RIGHT HEART CATHETERIZATION WITH CORONARY ANGIOGRAM N/A 06/05/2014   Procedure: LEFT AND RIGHT HEART CATHETERIZATION WITH CORONARY ANGIOGRAM;  Surgeon: Lesleigh Noe, MD;  Location: [facility] CATH LAB;  Service: Cardiovascular;  Laterality: N/A;   NASAL FRACTURE SURGERY     SHOULDER ARTHROSCOPY WITH ROTATOR CUFF REPAIR AND SUBACROMIAL DECOMPRESSION Right 08/16/2013   Procedure: SHOULDER ARTHROSCOPY WITH ROTATOR CUFF REPAIR AND SUBACROMIAL DECOMPRESSION;  Surgeon: Mable Paris, MD;  Location: [facility] OR;  Service: Orthopedics;  Laterality: Right;  Right shoulder arthroscopy rotator cuff repair, subacromial decompression.   TONSILLECTOMY     Past Medical History:  Diagnosis Date   Arthritis    R shoulder, bone spur   Arthritis    "hands" (03/25/2016)   Chronic diastolic heart failure (HCC)    Chronic lower back pain    Coronary heart disease    Dr Garnette Scheuermann   Diverticulitis 12/2021   DVT (deep venous thrombosis) (HCC) 05/2022   left leg   H/O cardiovascular stress test    perhaps last one was 2009   Heart murmur    12/29/11 echo: mild MR, no AS, trivial TR   History of gout    Hyperlipidemia    Hypertension    saw Dr. Mendel Ryder last earl;y- 2014, cardiac cath. last done ?2009, blocks seen didn't require any intervention at that point.   Narcolepsy    [name] 04-11-97; Mean Latency 1.82min, SOREM 2   OSA on CPAP    NPSG 12-13-98 AHI 22.7   PONV (postoperative nausea and vomiting)    Presence of permanent cardiac pacemaker    Shortness of breath    with exertion    Stroke (HCC)    Type II diabetes mellitus (HCC)    BP 118/75   Pulse 68   Ht 5\' 8"  (1.727 m)    Wt 211 lb (95.7 kg)   SpO2 91%   BMI 32.08 kg/m   Opioid Risk Score:   Fall Risk Score:  `1  Depression screen [facility] 2/9     01/14/2023   11:38 AM 10/14/2022   11:55 AM 04/19/2022   11:18 AM 05/09/2020   10:37 AM  Depression screen PHQ 2/9  Decreased Interest 0 0 0 0  Down, Depressed, Hopeless 0 0 1 0  PHQ - 2 Score 0 0 1 0  Altered sleeping   0   Tired, decreased energy   1  Change in appetite   0   Feeling bad or failure about yourself    0   Trouble concentrating   0   Moving slowly or fidgety/restless   1   Suicidal thoughts   0   PHQ-9 Score   3      Review of Systems  Constitutional: Negative.   HENT: Negative.    Eyes: Negative.   Respiratory:  Positive for apnea and cough.   Cardiovascular: Negative.   Gastrointestinal:  Positive for constipation.  Endocrine: Negative.   Genitourinary:  Positive for urgency.  Musculoskeletal:  Positive for back pain and gait problem.  Skin: Negative.   Allergic/Immunologic: Negative.   Neurological:  Positive for dizziness, tremors and weakness.  Hematological:  Bruises/bleeds easily.  Psychiatric/Behavioral: Negative.        Objective:   Physical Exam Gen: no distress, normal appearing, 118/75, BMI 32.08, weigh 211 lbs.  Gen: no distress, normal appearing HEENT: oral mucosa pink and moist, NCAT Cardio: Reg rate Chest: normal effort, normal rate of breathing Abd: soft, non-distended Ext: no edema Psych: pleasant, normal affect Skin: intact Neuro: Alert, flat affect, good sense of humor, wife has to provide most of history, =word finding difficulties Musculoskeletal: In WC, able to use bathroom with wife's assistance     Assessment & Plan:  1) CVA/impaired mobility and ADLs -discussed whether he would benefit from refresher of PT and OT and SLP -would benefit from handicap placard to increase mobility in the community -reviewed all medications and provided necessary refills. -discussed vagal nerve stimulation if  strength fails to improve in 6 months.  -discussed maximizing protein -discussed word finding difficulties -discussed hyperbaric oxygen therapy -discussed current mobility  2) Fatigue -continue vitamin D supplement -replace amantadine with modafinil. Continue modafinil.  Provided dietary education.   3) R leg weakess: -discussed relation with quadriceps weakness.   4) Neuroendocrine tumor -discussed octreotide.  -discussed decreased appetite  5) Parkinson's Disease -continue Sinemet -discussed current symptoms -prescribed PT, continue HEP  6) Impaired mobility and ADLs -discussed OT  7) Hypophonic speech:  -referred SLP -discussed that vocal quality has not improved -discussed that wife reminds him to speak louder  8) Hypotension:  -d/c metoprolol  9) Obesity: -discussed that he lost 2 lbs since last visit -discussed that in the morning he has a protein shake and something sweet and after that he is not very hungry.  -discussed that albumin is normal  10) Type 2 diabetes: -discussed that last HgbA1c is 7.8, down drom 10 -discussed that it had probably increased due to the octreotide.  -discussed that Januvia was added  11) REM sleep behavior disorder: -discussed that he takes seroquel for this  12) Low back pain: -discussed tens unit and he defers at this time  13) Insomnia: -discussed naps and sleep time -discussed pistachios

## 2023-04-12 NOTE — Therapy (Signed)
OUTPATIENT OCCUPATIONAL THERAPY [facility]  Patient Name: Jordan [name] MRN: [id number] DOB:1941/05/24, 82 y.o., male Today's Date: 04/12/2023  PCP: Johny Blamer, MD  REFERRING PROVIDER: Horton Chin, MD  END OF SESSION:  OT End of Session - 04/12/23 1527     Visit Number 9    Number of Visits 21    Date for OT Re-Evaluation 04/22/23    Authorization Type [employer] - [place], no auth required    Progress Note Due on Visit 10    OT Start Time 1448    OT Stop Time 1528    OT Time Calculation (min) 40 min    Activity Tolerance Patient tolerated treatment well    Behavior During Therapy [facility] for tasks assessed/performed             Past Medical History:  Diagnosis Date   Arthritis    R shoulder, bone spur   Arthritis    "hands" (03/25/2016)   Chronic diastolic heart failure (HCC)    Chronic lower back pain    Coronary heart disease    Dr Garnette Scheuermann   Diverticulitis 12/2021   DVT (deep venous thrombosis) (HCC) 05/2022   left leg   H/O cardiovascular stress test    perhaps last one was 2009   Heart murmur    12/29/11 echo: mild MR, no AS, trivial TR   History of gout    Hyperlipidemia    Hypertension    saw Dr. Mendel Ryder last earl;y- 2014, cardiac cath. last done ?2009, blocks seen didn't require any intervention at that point.   Narcolepsy    [name] 04-11-97; Mean Latency 1.68min, SOREM 2   OSA on CPAP    NPSG 12-13-98 AHI 22.7   PONV (postoperative nausea and vomiting)    Presence of permanent cardiac pacemaker    Shortness of breath    with exertion    Stroke (HCC)    Type II diabetes mellitus (HCC)    Past Surgical History:  Procedure Laterality Date   CARDIAC CATHETERIZATION  2009?   EP IMPLANTABLE DEVICE N/A 03/25/2016   Procedure: Pacemaker Implant - Dual Chamber;  Surgeon: Marinus Maw, MD;  Location: [facility] INVASIVE CV LAB;  Service: Cardiovascular;  Laterality: N/A;   FRACTURE SURGERY     HIP FRACTURE SURGERY     INSERT / REPLACE /  REMOVE PACEMAKER  03/24/2016   INTRAMEDULLARY (IM) NAIL INTERTROCHANTERIC Left 02/08/2022   Procedure: INTRAMEDULLARY (IM) NAIL INTERTROCHANTRIC;  Surgeon: Cammy Copa, MD;  Location: [facility] OR;  Service: Orthopedics;  Laterality: Left;   IR KYPHO LUMBAR INC FX REDUCE BONE BX UNI/BIL CANNULATION INC/IMAGING  12/29/2021   IR RADIOLOGIST EVAL & MGMT  12/15/2021   IR RADIOLOGY PERIPHERAL GUIDED IV START  07/14/2022   IR US GUIDE VASC ACCESS RIGHT  07/15/2022   LEFT AND RIGHT HEART CATHETERIZATION WITH CORONARY ANGIOGRAM N/A 06/05/2014   Procedure: LEFT AND RIGHT HEART CATHETERIZATION WITH CORONARY ANGIOGRAM;  Surgeon: Lesleigh Noe, MD;  Location: [facility] CATH LAB;  Service: Cardiovascular;  Laterality: N/A;   NASAL FRACTURE SURGERY     SHOULDER ARTHROSCOPY WITH ROTATOR CUFF REPAIR AND SUBACROMIAL DECOMPRESSION Right 08/16/2013   Procedure: SHOULDER ARTHROSCOPY WITH ROTATOR CUFF REPAIR AND SUBACROMIAL DECOMPRESSION;  Surgeon: Mable Paris, MD;  Location: [facility] OR;  Service: Orthopedics;  Laterality: Right;  Right shoulder arthroscopy rotator cuff repair, subacromial decompression.   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   Acute deep vein thrombosis (DVT) of left lower extremity (HCC) 04/29/2022   Closed intertrochanteric fracture of left hip, initial encounter [facility])    Closed left hip fracture (HCC) secondary to fall at home 02/08/2022   Paroxysmal atrial fibrillation (HCC) 02/08/2022   Debility 01/08/2022   Constipation 01/07/2022   Physical deconditioning 01/07/2022   Acute metabolic encephalopathy 01/04/2022   Hypomagnesemia 01/04/2022   Pancreatitis 12/30/2021   Parkinson's disease 12/21/2021   Parkinson disease 12/18/2021   Frequent falls 12/18/2021   History of CVA (cerebrovascular accident) 12/18/2021   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   Hallucinations, visual  07/30/2021   Acetabulum fracture, left (HCC) 06/25/2021   Acute hip pain, left 06/24/2021   Fall at home, initial encounter 06/24/2021   Leukocytosis 06/24/2021   Chronic diastolic heart failure (HCC)    Sinusitis, acute maxillary 06/24/2020   AKI (acute kidney injury) (HCC)    Labile blood glucose    Cough    Benign essential HTN    Skin lesion of hand    Hypoalbuminemia due to protein-calorie malnutrition (HCC)    Controlled type 2 diabetes mellitus with hyperglycemia, without long-term current use of insulin (HCC)    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   Morbid obesity (HCC)    Acute blood loss anemia    Acute thalamic infarction (HCC) 04/15/2020   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   TIA (transient ischemic attack) 02/17/2018   Type 2 diabetes mellitus with hyperlipidemia (HCC) 02/17/2018   Insomnia 02/01/2018   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   Chronotropic incompetence 07/09/2014   Dyspnea on exertion 03/03/2014   HLD (hyperlipidemia) 12/13/2007   Essential hypertension 12/13/2007   Class 1 obesity 12/05/2007   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   Pt accompanied by:  wife, Jordan [name]  PERTINENT HISTORY: "Mr. Buss is an 42 year old man who presents for follow-up after hip fracture.    1) Left hip fracture:  -not walking much, but can walk with rolling walker I condominium unit.  -his wife accompanies him today  and provides most of history -his walking discharge is very short.  -no longer painful -has weakness in bilateral lower extremities -no more falls.    2) Parkinson's: -sometimes muscles don't cooperate the way they should. -he has vascular parkinsonism -has stooped posture   3) Right leg weakness: -when walking.  -wife is nervous about him falling -he was in skilled rehab and was discharged June 8th  -they have not yet set up outpatient therapy -he has not had any professional therapy since discharge on June 8th.    4) 6 bottom teeth removed -has more of a challenge in forming words.    5) Neuroendocrine tumor -being treated with monthly octreotide -no current symptoms currently -was having persistent abdominal pain- they thought it was a flare of diverticulitis but this was not present -CBGs increase because of octreotide- CBGs as high as 250/260 -he asks for icecream   He was  diagnosed with a vascular Parkinsonism in  Jan 2022 and has history of CVA in June 2021 per pt report and spouse. Pt fell and sustained hip fx on 02/08/22 and pt underwent left  IM nailing.  04/30/22:  hospitalized with blood clot LLE--changed meds and cleared to resume OT/PT."  PRECAUTIONS: Fall  WEIGHT BEARING RESTRICTIONS: No  PAIN:  Are you having pain? No  FALLS: Has patient fallen in last 6 months? No  Lives with: lives with their spouse Lives in: Other Condo Stairs:  On the 10th floor, there are 2 elevators  Has following equipment at home: Dan Humphreys - 2 wheeled, shower chair, Grab bars, and transport chair   PLOF: Independent with household mobility with device and Needs assistance with ADLs Wife helps with ADLs, pt can get dressed and bathe himself it just takes longer. "Socks take forever"   PATIENT GOALS: Improve UE functional use  OBJECTIVE:   HAND DOMINANCE: Right  ADLs: Overall ADLs: min to moderate assistance (requires more assistance due to time  constraints)  IADLs: Handwriting: 25% legible, Increased time, and Severe micrographia  MOBILITY STATUS: Needs Assist: uses transport chair, dependent for navigation  ACTIVITY TOLERANCE: Activity tolerance: fair  FUNCTIONAL OUTCOME MEASURES: PSFS:  COORDINATION: Box and Blocks:  Right 31 blocks, Left 34 blocks  UE ROM:  WFL  UE MMT:   WFL  SENSATION: Reports paresthesias in R hand all fingers  MUSCLE TONE: not fully tested this visit  COGNITION: Overall cognitive status: Impaired  OBSERVATIONS: Bradykinesia and Postural tremors   TODAY'S TREATMENT:                                                                                                                              - Therapeutic activities completed for duration as noted below including: Pt demonstrated use of new, home peg board he had purchased. Cues for pt to utilize preparatory movements prior to completion. He carries over placement of pegs at opposite end of board first, which is an improvement from his last visit.   OT placed 4 BlazePods in front of patient and had patient tap pods with palm of hand and using foam wedge as pods lit up using random mode for improved processing, scanning and locating of items, reaction time, upper extremity range of motion, gross motor coordination, grip strength.    PATIENT EDUCATION: Education details: UE ROM Person educated: Patient and Spouse Education method: Explanation, Demonstration, and Handouts Education comprehension: verbalized understanding, returned demonstration, and needs further education  HOME EXERCISE PROGRAM: 5/9: pen tricks 03/22/23: review and reprint of coordination HEP, memory strategies, thinking skills, PD ex chart from 06/2022 03/29/23: PWR! Seated, handwriting strategies 04/05/23: neck ex's, ways to prevent future PD related complications, ADL strategies   GOALS:  SHORT TERM GOALS: Target date: 03/11/2023    Pt will be independent with PD specific  HEP.  Baseline: not yet initiated Goal status: ONGOING  2.  Pt will verbalize understanding of adapted strategies to maximize safety and independence  with ADLs/IADLs.  Baseline: not yet initiated Goal status: IN PROGRESS  3.  Pt will write a sentence with no significant decrease in size and maintain 50% legibility.  Baseline: 25% legibility Goal status: INITIAL  4.  Will assess 9 hole peg and set appropriate LTG.  Baseline:  04/07/2023 R- 44 seconds; 42 seconds; 41 seconds: L- 46 seconds; 46 seconds Goal status: INITIAL  5.  Will assess 3 button/button time and set appropriate LTG.  Baseline:  04/07/2023 - able to button 3 buttons in 3 minutes - improved to 92 seconds with cues for PWR! Hands and angry button technique Goal status: INITIAL  6.  Will assess PPT#4 (don/doff jacket and set appropriate goal) Baseline:  Goal status: INITIAL  LONG TERM GOALS: Target date: 04/22/2023    Pt will be able to place at least 35 blocks using each hand with completion of Box and Blocks test. Baseline: Right 31 blocks, Left 34 blocks Goal status: INITIAL  2.  Patient will report at least two-point increase in average PSFS score or at least three-point increase in a single activity score indicating functionally significant improvement given minimum detectable change. Baseline: 3 total score (see above for single activity scores) Goal status: INITIAL  3.  Pt will verbalize understanding of ways to prevent future PD related complications and PD community resources.  Baseline: not yet initiated Goal status: PARTIALLY MET (issued first part)   4.  Pt will write a sentence with no significant decrease in size and maintain 75% legibility.  Baseline: 25% legibility Goal status: INITIAL   ASSESSMENT:  CLINICAL IMPRESSION: Pt demonstrating improved carry-over of education and strategies though still requiring cues to utilize all strategies as instructed.  PERFORMANCE DEFICITS: in functional  skills including ADLs, IADLs, coordination, sensation, strength, pain, Fine motor control, Gross motor control, mobility, balance, endurance, decreased knowledge of use of DME, and UE functional use, cognitive skills including attention, memory, problem solving, and safety awareness.  IMPAIRMENTS: are limiting patient from ADLs, IADLs, rest and sleep, leisure, and social participation.   COMORBIDITIES:  may have co-morbidities  that affects occupational performance. Patient will benefit from skilled OT to address above impairments and improve overall function.  REHAB POTENTIAL: Fair given PD prognosis   PLAN:  OT FREQUENCY: 2x/week  OT DURATION: 10 weeks  PLANNED INTERVENTIONS: self care/ADL training, therapeutic exercise, therapeutic activity, manual therapy, passive range of motion, balance training, functional mobility training, splinting, ultrasound, paraffin, fluidotherapy, moist heat, patient/family education, cognitive remediation/compensation, energy conservation, coping strategies training, DME and/or AE instructions, and Re-evaluation  RECOMMENDED OTHER SERVICES: none at this time  CONSULTED AND AGREED WITH PLAN OF CARE: Patient and family member/caregiver  PLAN FOR NEXT SESSION: PROGRESS NOTE; assess STG 6, work on donning/doffing shirt and jacket (pt still to bring in jacket)    Delana Meyer, OT 04/12/2023, 5:55 PM

## 2023-04-14 ENCOUNTER — Encounter: Payer: Medicare (Managed Care) | Admitting: Occupational Therapy

## 2023-04-14 DIAGNOSIS — M5136 Other intervertebral disc degeneration, lumbar region: Secondary | ICD-10-CM | POA: Diagnosis not present

## 2023-04-14 DIAGNOSIS — M9903 Segmental and somatic dysfunction of lumbar region: Secondary | ICD-10-CM | POA: Diagnosis not present

## 2023-04-14 DIAGNOSIS — M9905 Segmental and somatic dysfunction of pelvic region: Secondary | ICD-10-CM | POA: Diagnosis not present

## 2023-04-14 DIAGNOSIS — M9904 Segmental and somatic dysfunction of sacral region: Secondary | ICD-10-CM | POA: Diagnosis not present

## 2023-04-19 ENCOUNTER — Encounter: Payer: Medicare (Managed Care) | Admitting: Occupational Therapy

## 2023-04-21 ENCOUNTER — Encounter: Payer: Medicare (Managed Care) | Admitting: Occupational Therapy

## 2023-04-26 ENCOUNTER — Ambulatory Visit: Payer: Medicare (Managed Care) | Admitting: Occupational Therapy

## 2023-04-26 ENCOUNTER — Encounter: Payer: Self-pay | Admitting: Occupational Therapy

## 2023-04-26 DIAGNOSIS — M6281 Muscle weakness (generalized): Secondary | ICD-10-CM

## 2023-04-26 DIAGNOSIS — R293 Abnormal posture: Secondary | ICD-10-CM | POA: Diagnosis not present

## 2023-04-26 DIAGNOSIS — R278 Other lack of coordination: Secondary | ICD-10-CM

## 2023-04-26 DIAGNOSIS — R2681 Unsteadiness on feet: Secondary | ICD-10-CM

## 2023-04-26 DIAGNOSIS — R41840 Attention and concentration deficit: Secondary | ICD-10-CM

## 2023-04-26 DIAGNOSIS — R251 Tremor, unspecified: Secondary | ICD-10-CM

## 2023-04-26 NOTE — Patient Instructions (Signed)
  To DON jacket:   1) Wide stance - stand in front of sofa or kitchen counter/sturdy table and walker in front 2) hold facing tag out with hands big at sleeves 3) Swing around big like a cape to Rt side 4) Punch Rt arm in as you continue to pull behind you with Lt hand to Lt 5) Punch Lt arm in   To DOFF jacket:   1) Wide stance 2) Hold big hands at belly button level 3) Turn and twist to Rt to take off Rt shoulder 4) Turn and twist to Lt to take off Lt shoulder 5) Reach big behind you and grab cuff of sleeve BIG and yank off one hand, then then ot

## 2023-04-26 NOTE — Therapy (Signed)
OUTPATIENT OCCUPATIONAL THERAPY PARKINSON'S TREATMENT/RENEWAL  Patient Name: Jordan [name] MRN: [id number] DOB:04-Feb-1941, 82 y.o., male Today's Date: 04/26/2023  PCP: Jordan Blamer, MD  REFERRING PROVIDER: Horton Chin, MD  END OF SESSION:  OT End of Session - 04/26/23 1241     Visit Number 10    Number of Visits 21    Date for OT Re-Evaluation 06/02/23    Authorization Type [employer] - [place], no auth required    Progress Note Due on Visit 10    OT Start Time 1240   Pt arrived late   OT Stop Time 1320    OT Time Calculation (min) 40 min    Activity Tolerance Patient tolerated treatment well    Behavior During Therapy WFL for tasks assessed/performed             Past Medical History:  Diagnosis Date   Arthritis    R shoulder, bone spur   Arthritis    "hands" (03/25/2016)   Chronic diastolic heart failure (HCC)    Chronic lower back pain    Coronary heart disease    Dr Jordan [name]   Diverticulitis 12/2021   DVT (deep venous thrombosis) (HCC) 05/2022   left leg   H/O cardiovascular stress test    perhaps last one was 2009   Heart murmur    12/29/11 echo: mild MR, no AS, trivial TR   History of gout    Hyperlipidemia    Hypertension    saw Dr. Mendel [name] last earl;y- 2014, cardiac cath. last done ?2009, blocks seen didn't require any intervention at that point.   Narcolepsy    [name] 04-11-97; Mean Latency 1.58min, SOREM 2   OSA on CPAP    NPSG 12-13-98 AHI 22.7   PONV (postoperative nausea and vomiting)    Presence of permanent cardiac pacemaker    Shortness of breath    with exertion    Stroke (HCC)    Type II diabetes mellitus (HCC)    Past Surgical History:  Procedure Laterality Date   CARDIAC CATHETERIZATION  2009?   EP IMPLANTABLE DEVICE N/A 03/25/2016   Procedure: Pacemaker Implant - Dual Chamber;  Surgeon: Jordan Maw, MD;  Location: [facility] INVASIVE CV LAB;  Service: Cardiovascular;  Laterality: N/A;   FRACTURE SURGERY     HIP FRACTURE SURGERY      INSERT / REPLACE / REMOVE PACEMAKER  03/24/2016   INTRAMEDULLARY (IM) NAIL INTERTROCHANTERIC Left 02/08/2022   Procedure: INTRAMEDULLARY (IM) NAIL INTERTROCHANTRIC;  Surgeon: Jordan Copa, MD;  Location: [facility] OR;  Service: Orthopedics;  Laterality: Left;   IR KYPHO LUMBAR INC FX REDUCE BONE BX UNI/BIL CANNULATION INC/IMAGING  12/29/2021   IR RADIOLOGIST EVAL & MGMT  12/15/2021   IR RADIOLOGY PERIPHERAL GUIDED IV START  07/14/2022   IR US GUIDE VASC ACCESS RIGHT  07/15/2022   LEFT AND RIGHT HEART CATHETERIZATION WITH CORONARY ANGIOGRAM N/A 06/05/2014   Procedure: LEFT AND RIGHT HEART CATHETERIZATION WITH CORONARY ANGIOGRAM;  Surgeon: Jordan Noe, MD;  Location: [facility] CATH LAB;  Service: Cardiovascular;  Laterality: N/A;   NASAL FRACTURE SURGERY     SHOULDER ARTHROSCOPY WITH ROTATOR CUFF REPAIR AND SUBACROMIAL DECOMPRESSION Right 08/16/2013   Procedure: SHOULDER ARTHROSCOPY WITH ROTATOR CUFF REPAIR AND SUBACROMIAL DECOMPRESSION;  Surgeon: Jordan Paris, MD;  Location: [facility] OR;  Service: Orthopedics;  Laterality: Right;  Right shoulder arthroscopy rotator cuff repair, subacromial decompression.   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   Acute deep vein thrombosis (DVT) of left lower extremity (HCC) 04/29/2022   Closed intertrochanteric fracture of left hip, initial encounter [facility])    Closed left hip fracture (HCC) secondary to fall at home 02/08/2022   Paroxysmal atrial fibrillation (HCC) 02/08/2022   Debility 01/08/2022   Constipation 01/07/2022   Physical deconditioning 01/07/2022   Acute metabolic encephalopathy 01/04/2022   Hypomagnesemia 01/04/2022   Pancreatitis 12/30/2021   Parkinson's disease 12/21/2021   Parkinson disease 12/18/2021   Frequent falls 12/18/2021   History of CVA (cerebrovascular accident) 12/18/2021   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    Hallucinations, visual 07/30/2021   Acetabulum fracture, left (HCC) 06/25/2021   Acute hip pain, left 06/24/2021   Fall at home, initial encounter 06/24/2021   Leukocytosis 06/24/2021   Chronic diastolic heart failure (HCC)    Sinusitis, acute maxillary 06/24/2020   AKI (acute kidney injury) (HCC)    Labile blood glucose    Cough    Benign essential HTN    Skin lesion of hand    Hypoalbuminemia due to protein-calorie malnutrition (HCC)    Controlled type 2 diabetes mellitus with hyperglycemia, without long-term current use of insulin (HCC)    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   Morbid obesity (HCC)    Acute blood loss anemia    Acute thalamic infarction (HCC) 04/15/2020   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   TIA (transient ischemic attack) 02/17/2018   Type 2 diabetes mellitus with hyperlipidemia (HCC) 02/17/2018   Insomnia 02/01/2018   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   Chronotropic incompetence 07/09/2014   Dyspnea on exertion 03/03/2014   HLD (hyperlipidemia) 12/13/2007   Essential hypertension 12/13/2007   Class 1 obesity 12/05/2007   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 No recent falls  Pt accompanied by:  wife, Jordan [name]  PERTINENT HISTORY: "[name] is an 82 year old man who presents for follow-up after hip fracture.    1) Left hip fracture:  -not walking much, but can walk  with rolling walker I condominium unit.  -his wife accompanies him today and provides most of history -his walking discharge is very short.  -no longer painful -has weakness in bilateral lower extremities -no more falls.    2) Parkinson's: -sometimes muscles don't cooperate the way they should. -he has vascular parkinsonism -has stooped posture   3) Right leg weakness: -when walking.  -wife is nervous about him falling -he was in skilled rehab and was discharged June 8th  -they have not yet set up outpatient therapy -he has not had any professional therapy since discharge on June 8th.    4) 6 bottom teeth removed -has more of a challenge in forming words.    5) Neuroendocrine tumor -being treated with monthly octreotide -no current symptoms currently -was having persistent abdominal pain- they thought it was a flare of diverticulitis but this was not present -CBGs increase because of octreotide- CBGs as high as 250/260 -he asks for icecream  He was  diagnosed with a vascular Parkinsonism in Jan 2022 and has history of CVA in June 2021 per pt report and spouse. Pt fell and sustained hip fx on 02/08/22 and pt underwent left  IM nailing.  04/30/22:  hospitalized with blood clot LLE--changed meds and cleared to resume OT/PT."  PRECAUTIONS: Fall  WEIGHT BEARING RESTRICTIONS: No  PAIN:  Are you having pain? No  FALLS: Has patient fallen in last 6 months? No  Lives with: lives with their spouse Lives in: Other Condo Stairs:  On the 10th floor, there are 2 elevators  Has following equipment at home: Dan Humphreys - 2 wheeled, shower chair, Grab bars, and transport chair   PLOF: Independent with household mobility with device and Needs assistance with ADLs Wife helps with ADLs, pt can get dressed and bathe himself it just takes longer. "Socks take forever"   PATIENT GOALS: Improve UE functional use  OBJECTIVE:   HAND DOMINANCE: Right  ADLs: Overall ADLs: min to moderate  assistance (requires more assistance due to time constraints)  IADLs: Handwriting: 25% legible, Increased time, and Severe micrographia  MOBILITY STATUS: Needs Assist: uses transport chair, dependent for navigation  ACTIVITY TOLERANCE: Activity tolerance: fair  FUNCTIONAL OUTCOME MEASURES: PSFS:  COORDINATION: Box and Blocks:  Right 31 blocks, Left 34 blocks  UE ROM:  WFL  UE MMT:   WFL  SENSATION: Reports paresthesias in R hand all fingers  MUSCLE TONE: not fully tested this visit  COGNITION: Overall cognitive status: Impaired  OBSERVATIONS: Bradykinesia and Postural tremors   TODAY'S TREATMENT:                                                                                                                               Pt brought in jacket today to practice - assessed time for PPT #4 and updated goal before practicing with strategies (see STG section)  Pt then practiced donning/doffing jacket with strategies (cape method) - see pt instructions for details. Pt practiced standing and sitting.   Assessed 3 button/unbutton test - see below goal section. Did not have time to assess remaining STG's d/t time constraints.    PATIENT EDUCATION: Education details: UE ROM Person educated: Patient and Spouse Education method: Explanation, Demonstration, and Handouts Education comprehension: verbalized understanding, returned demonstration, and needs further education  HOME EXERCISE PROGRAM: 5/9: pen tricks 03/22/23: review and reprint of coordination HEP, memory strategies, thinking skills, PD ex chart from 06/2022 03/29/23: PWR! Seated, handwriting strategies 04/05/23: neck ex's, ways to prevent future PD related complications, ADL strategies   GOALS:  SHORT TERM GOALS: Target date: 03/11/2023    Pt will be independent with PD specific HEP.  Baseline: not yet initiated Goal status: MET  2.  Pt will verbalize understanding of adapted strategies to maximize safety and  independence with ADLs/IADLs.  Baseline: not yet initiated Goal status: MET  3.  Pt will write a sentence with no significant decrease in size and  maintain 50% legibility.  Baseline: 25% legibility Goal status: IN PROGRESS  4.  Will assess 9 hole peg and set appropriate LTG.  Baseline:  04/07/2023 R- 44 seconds; 42 seconds; 41 seconds: L- 46 seconds; 46 seconds Goal status: IN PROGRESS   5.  Will perform 3 button/button test in under 90 sec.  Baseline:  04/07/2023 - able to button 3 buttons in 3 minutes - improved to 92 seconds with cues for PWR! Hands and angry button technique Goal status: MET (55 sec)   6.  Will perform PPT#4 (don/doff jacket) in 1 minute w/o assist Baseline: 04/26/23: seated = 2 min. 22 sec      04/26/23: standing = 1 min. 13 sec. W/ min assist Goal status: Updated/revised  LONG TERM GOALS: Target date: 04/22/2023    Pt will be able to place at least 35 blocks using each hand with completion of Box and Blocks test. Baseline: Right 31 blocks, Left 34 blocks Goal status: INITIAL  2.  Patient will report at least two-point increase in average PSFS score or at least three-point increase in a single activity score indicating functionally significant improvement given minimum detectable change. Baseline: 3 total score (see above for single activity scores) Goal status: INITIAL  3.  Pt will verbalize understanding of ways to prevent future PD related complications and PD community resources.  Baseline: not yet initiated Goal status: PARTIALLY MET (issued first part)   4.  Pt will write a sentence with no significant decrease in size and maintain 75% legibility.  Baseline: 25% legibility Goal status: INITIAL   ASSESSMENT:  CLINICAL IMPRESSION: This 10th progress note is for dates 02/09/23 to 04/26/23: Pt has met 3 STG's at this time and progressing towards remaining STG's. Pt will also be renewed today to continue to work on unmet STG's and LTG's, as pt has not  been able to come consistently d/t scheduling conflicts and personal conflicts. PERFORMANCE DEFICITS: in functional skills including ADLs, IADLs, coordination, sensation, strength, pain, Fine motor control, Gross motor control, mobility, balance, endurance, decreased knowledge of use of DME, and UE functional use, cognitive skills including attention, memory, problem solving, and safety awareness.  IMPAIRMENTS: are limiting patient from ADLs, IADLs, rest and sleep, leisure, and social participation.   COMORBIDITIES:  may have co-morbidities  that affects occupational performance. Patient will benefit from skilled OT to address above impairments and improve overall function.  REHAB POTENTIAL: Fair given PD prognosis   PLAN:  OT FREQUENCY: 2x/week  OT DURATION: 6 additional weeks (beginning week of 04/26/23)   PLANNED INTERVENTIONS: self care/ADL training, therapeutic exercise, therapeutic activity, manual therapy, passive range of motion, balance training, functional mobility training, splinting, ultrasound, paraffin, fluidotherapy, moist heat, patient/family education, cognitive remediation/compensation, energy conservation, coping strategies training, DME and/or AE instructions, and Re-evaluation  RECOMMENDED OTHER SERVICES: none at this time  CONSULTED AND AGREED WITH PLAN OF CARE: Patient and family member/caregiver  PLAN FOR NEXT SESSION: check remaining STG's, work on shirt and buttons, continue coordination, PWR! Up for posture seated and for sit to stand   Sheran Lawless, OT 04/26/2023, 2:24 PM

## 2023-04-28 ENCOUNTER — Ambulatory Visit (INDEPENDENT_AMBULATORY_CARE_PROVIDER_SITE_OTHER): Payer: Medicare (Managed Care)

## 2023-04-28 ENCOUNTER — Ambulatory Visit: Payer: Medicare (Managed Care) | Admitting: Occupational Therapy

## 2023-04-28 DIAGNOSIS — M6281 Muscle weakness (generalized): Secondary | ICD-10-CM

## 2023-04-28 DIAGNOSIS — R278 Other lack of coordination: Secondary | ICD-10-CM

## 2023-04-28 DIAGNOSIS — R251 Tremor, unspecified: Secondary | ICD-10-CM

## 2023-04-28 DIAGNOSIS — R293 Abnormal posture: Secondary | ICD-10-CM | POA: Diagnosis not present

## 2023-04-28 DIAGNOSIS — I495 Sick sinus syndrome: Secondary | ICD-10-CM

## 2023-04-28 NOTE — Therapy (Signed)
OUTPATIENT OCCUPATIONAL THERAPY [facility]  Patient Name: Jordan [name] MRN: [id number] DOB:1941/02/24, 82 y.o., male Today's Date: 04/29/2023  PCP: Jordan Blamer, MD  REFERRING PROVIDER: Horton Chin, MD  END OF SESSION:  OT End of Session - 04/28/23 1244     Visit Number 11    Number of Visits 21    Date for OT Re-Evaluation 06/02/23    Authorization Type [employer] - [place], no auth required    Progress Note Due on Visit 10    OT Start Time 1244    OT Stop Time 1322    OT Time Calculation (min) 38 min    Activity Tolerance Patient tolerated treatment well    Behavior During Therapy WFL for tasks assessed/performed             Past Medical History:  Diagnosis Date   Arthritis    R shoulder, bone spur   Arthritis    "hands" (03/25/2016)   Chronic diastolic heart failure (HCC)    Chronic lower back pain    Coronary heart disease    Dr Jordan [name]   Diverticulitis 12/2021   DVT (deep venous thrombosis) (HCC) 05/2022   left leg   H/O cardiovascular stress test    perhaps last one was 2009   Heart murmur    12/29/11 echo: mild MR, no AS, trivial TR   History of gout    Hyperlipidemia    Hypertension    saw Dr. Mendel [name] last earl;y- 2014, cardiac cath. last done ?2009, blocks seen didn't require any intervention at that point.   Narcolepsy    [name] 04-11-97; Mean Latency 1.53min, SOREM 2   OSA on CPAP    NPSG 12-13-98 AHI 22.7   PONV (postoperative nausea and vomiting)    Presence of permanent cardiac pacemaker    Shortness of breath    with exertion    Stroke (HCC)    Type II diabetes mellitus (HCC)    Past Surgical History:  Procedure Laterality Date   CARDIAC CATHETERIZATION  2009?   EP IMPLANTABLE DEVICE N/A 03/25/2016   Procedure: Pacemaker Implant - Dual Chamber;  Surgeon: Jordan Maw, MD;  Location: [facility] INVASIVE CV LAB;  Service: Cardiovascular;  Laterality: N/A;   FRACTURE SURGERY     HIP FRACTURE SURGERY     INSERT / REPLACE /  REMOVE PACEMAKER  03/24/2016   INTRAMEDULLARY (IM) NAIL INTERTROCHANTERIC Left 02/08/2022   Procedure: INTRAMEDULLARY (IM) NAIL INTERTROCHANTRIC;  Surgeon: Jordan Copa, MD;  Location: [facility] OR;  Service: Orthopedics;  Laterality: Left;   IR KYPHO LUMBAR INC FX REDUCE BONE BX UNI/BIL CANNULATION INC/IMAGING  12/29/2021   IR RADIOLOGIST EVAL & MGMT  12/15/2021   IR RADIOLOGY PERIPHERAL GUIDED IV START  07/14/2022   IR US GUIDE VASC ACCESS RIGHT  07/15/2022   LEFT AND RIGHT HEART CATHETERIZATION WITH CORONARY ANGIOGRAM N/A 06/05/2014   Procedure: LEFT AND RIGHT HEART CATHETERIZATION WITH CORONARY ANGIOGRAM;  Surgeon: Jordan Noe, MD;  Location: [facility] CATH LAB;  Service: Cardiovascular;  Laterality: N/A;   NASAL FRACTURE SURGERY     SHOULDER ARTHROSCOPY WITH ROTATOR CUFF REPAIR AND SUBACROMIAL DECOMPRESSION Right 08/16/2013   Procedure: SHOULDER ARTHROSCOPY WITH ROTATOR CUFF REPAIR AND SUBACROMIAL DECOMPRESSION;  Surgeon: Jordan Paris, MD;  Location: [facility] OR;  Service: Orthopedics;  Laterality: Right;  Right shoulder arthroscopy rotator cuff repair, subacromial decompression.   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   Acute deep vein thrombosis (DVT) of left lower extremity (HCC) 04/29/2022   Closed intertrochanteric fracture of left hip, initial encounter [facility])    Closed left hip fracture (HCC) secondary to fall at home 02/08/2022   Paroxysmal atrial fibrillation (HCC) 02/08/2022   Debility 01/08/2022   Constipation 01/07/2022   Physical deconditioning 01/07/2022   Acute metabolic encephalopathy 01/04/2022   Hypomagnesemia 01/04/2022   Pancreatitis 12/30/2021   Parkinson's disease 12/21/2021   Parkinson disease 12/18/2021   Frequent falls 12/18/2021   History of CVA (cerebrovascular accident) 12/18/2021   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   Hallucinations, visual  07/30/2021   Acetabulum fracture, left (HCC) 06/25/2021   Acute hip pain, left 06/24/2021   Fall at home, initial encounter 06/24/2021   Leukocytosis 06/24/2021   Chronic diastolic heart failure (HCC)    Sinusitis, acute maxillary 06/24/2020   AKI (acute kidney injury) (HCC)    Labile blood glucose    Cough    Benign essential HTN    Skin lesion of hand    Hypoalbuminemia due to protein-calorie malnutrition (HCC)    Controlled type 2 diabetes mellitus with hyperglycemia, without long-term current use of insulin (HCC)    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   Morbid obesity (HCC)    Acute blood loss anemia    Acute thalamic infarction (HCC) 04/15/2020   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   TIA (transient ischemic attack) 02/17/2018   Type 2 diabetes mellitus with hyperlipidemia (HCC) 02/17/2018   Insomnia 02/01/2018   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   Chronotropic incompetence 07/09/2014   Dyspnea on exertion 03/03/2014   HLD (hyperlipidemia) 12/13/2007   Essential hypertension 12/13/2007   Class 1 obesity 12/05/2007   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 He forgot his shirt but did bring his binder.   Pt accompanied by:  wife, Jordan [name]  PERTINENT HISTORY: "Jordan [name] is an 82 year old man who presents for follow-up after hip fracture.    1) Left hip fracture:  -not walking much, but can walk with rolling walker I condominium unit.  -his wife accompanies him today and  provides most of history -his walking discharge is very short.  -no longer painful -has weakness in bilateral lower extremities -no more falls.    2) Parkinson's: -sometimes muscles don't cooperate the way they should. -he has vascular parkinsonism -has stooped posture   3) Right leg weakness: -when walking.  -wife is nervous about him falling -he was in skilled rehab and was discharged June 8th  -they have not yet set up outpatient therapy -he has not had any professional therapy since discharge on June 8th.    4) 6 bottom teeth removed -has more of a challenge in forming words.    5) Neuroendocrine tumor -being treated with monthly octreotide -no current symptoms currently -was having persistent abdominal pain- they thought it was a flare of diverticulitis but this was not present -CBGs increase because of octreotide- CBGs as high as 250/260 -he asks for icecream   He was  diagnosed with a vascular  Parkinsonism in Jan 2022 and has history of CVA in June 2021 per pt report and spouse. Pt fell and sustained hip fx on 02/08/22 and pt underwent left  IM nailing.  04/30/22:  hospitalized with blood clot LLE--changed meds and cleared to resume OT/PT."  PRECAUTIONS: Fall  WEIGHT BEARING RESTRICTIONS: No  PAIN:  Are you having pain? No  FALLS: Has patient fallen in last 6 months? No  Lives with: lives with their spouse Lives in: Other Condo Stairs:  On the 10th floor, there are 2 elevators  Has following equipment at home: Dan Humphreys - 2 wheeled, shower chair, Grab bars, and transport chair   PLOF: Independent with household mobility with device and Needs assistance with ADLs Wife helps with ADLs, pt can get dressed and bathe himself it just takes longer. "Socks take forever"   PATIENT GOALS: Improve UE functional use  OBJECTIVE:   HAND DOMINANCE: Right  ADLs: Overall ADLs: min to moderate assistance (requires more assistance due to time constraints)  IADLs: Handwriting:  25% legible, Increased time, and Severe micrographia  MOBILITY STATUS: Needs Assist: uses transport chair, dependent for navigation  ACTIVITY TOLERANCE: Activity tolerance: fair  FUNCTIONAL OUTCOME MEASURES: PSFS:  COORDINATION: Box and Blocks:  Right 31 blocks, Left 34 blocks  UE ROM:  WFL  UE MMT:   WFL  SENSATION: Reports paresthesias in R hand all fingers  MUSCLE TONE: not fully tested this visit  COGNITION: Overall cognitive status: Impaired  OBSERVATIONS: Bradykinesia and Postural tremors   TODAY'S TREATMENT:                                                                                                                               Pt completed PWR sitting Up and Twist with use of Bo[employer]ackers and visual Boost to look up and to opposite arm for postural improvement. Pt able to complete sit to stand with min A and use of gait belt.   OT provided education with respect to long-term vs instantaneous rewards as pt has noticeably improved posture and mobility since starting therapy in April but his preferred reward (cupcake) is not long-lasting or helpful to him functionally.   Therapist reviewed goals with patient and updated patient progression.    PATIENT EDUCATION: Education details: PWR Boosts/completion Person educated: Patient and Spouse Education method: Explanation, Facilities manager, and Handouts Education comprehension: verbalized understanding, returned demonstration, and needs further education  HOME EXERCISE PROGRAM: 5/9: pen tricks 03/22/23: review and reprint of coordination HEP, memory strategies, thinking skills, PD ex chart from 06/2022 03/29/23: PWR! Seated, handwriting strategies 04/05/23: neck ex's, ways to prevent future PD related complications, ADL strategies   GOALS:  SHORT TERM GOALS: Target date: 03/11/2023    Pt will be independent with PD specific HEP.  Baseline: not yet initiated Goal status: MET  2.  Pt will verbalize understanding of  adapted strategies to maximize safety and independence with ADLs/IADLs.  Baseline: not yet initiated Goal status: MET  3.  Pt will write a sentence with no significant decrease in size and maintain 50% legibility.  Baseline: 25% legibility Goal status: IN PROGRESS  4.  Will assess 9 hole peg and set appropriate LTG.  Baseline:  04/07/2023 R- 44 seconds; 42 seconds; 41 seconds: L- 46 seconds; 46 seconds Goal status: IN PROGRESS   5.  Will perform 3 button/button test in under 90 sec.  Baseline:  04/07/2023 - able to button 3 buttons in 3 minutes - improved to 92 seconds with cues for PWR! Hands and angry button technique Goal status: MET (55 sec)   6.  Will perform PPT#4 (don/doff jacket) in 1 minute w/o assist Baseline: 04/26/23: seated = 2 min. 22 sec      04/26/23: standing = 1 min. 13 sec. W/ min assist Goal status: Updated/revised   LONG TERM GOALS: Target date: 04/22/2023    Pt will be able to place at least 35 blocks using each hand with completion of Box and Blocks test. Baseline: Right 31 blocks, Left 34 blocks Goal status: INITIAL  2.  Patient will report at least two-point increase in average PSFS score or at least three-point increase in a single activity score indicating functionally significant improvement given minimum detectable change. Baseline: 3 total score (see above for single activity scores) Goal status: INITIAL  3.  Pt will verbalize understanding of ways to prevent future PD related complications and PD community resources.  Baseline: not yet initiated Goal status: PARTIALLY MET (issued first part)   4.  Pt will write a sentence with no significant decrease in size and maintain 75% legibility.  Baseline: 25% legibility Goal status: INITIAL  5. Patient will complete nine-hole peg with use of R in 35 seconds and with use of L in 40 seconds or less.  Baseline: R- 41 seconds; L- 46 seconds Goal status: INITIAL  ASSESSMENT:  CLINICAL IMPRESSION: Pt  demonstrates progression towards goals; however remains limited by cognition and motivation as needed to reach max rehab potential. Will continue to encourage carryover of HEP and education as needed to improve independence and safety with ADLs and IADLs.  PERFORMANCE DEFICITS: in functional skills including ADLs, IADLs, coordination, sensation, strength, pain, Fine motor control, Gross motor control, mobility, balance, endurance, decreased knowledge of use of DME, and UE functional use, cognitive skills including attention, memory, problem solving, and safety awareness.  IMPAIRMENTS: are limiting patient from ADLs, IADLs, rest and sleep, leisure, and social participation.   COMORBIDITIES:  may have co-morbidities  that affects occupational performance. Patient will benefit from skilled OT to address above impairments and improve overall function.  REHAB POTENTIAL: Fair given PD prognosis   PLAN:  OT FREQUENCY: 2x/week  OT DURATION: 6 additional weeks (beginning week of 04/26/23)   PLANNED INTERVENTIONS: self care/ADL training, therapeutic exercise, therapeutic activity, manual therapy, passive range of motion, balance training, functional mobility training, splinting, ultrasound, paraffin, fluidotherapy, moist heat, patient/family education, cognitive remediation/compensation, energy conservation, coping strategies training, DME and/or AE instructions, and Re-evaluation  RECOMMENDED OTHER SERVICES: none at this time  CONSULTED AND AGREED WITH PLAN OF CARE: Patient and family member/caregiver  PLAN FOR NEXT SESSION: continue to work towards remaining STGs, work on Horticulturist, commercial, continue coordination, PWR! Up for posture seated and for sit to stand   Delana Meyer, OT 04/29/2023, 3:44 PM

## 2023-05-01 LAB — CUP PACEART REMOTE DEVICE CHECK
Battery Remaining Percentage: 45 %
Brady Statistic RA Percent Paced: 93 %
Brady Statistic RV Percent Paced: 18 %
Date Time Interrogation Session: 20240628131324
Implantable Lead Connection Status: 753985
Implantable Lead Connection Status: 753985
Implantable Lead Implant Date: 20170525
Implantable Lead Implant Date: 20170525
Implantable Lead Location: 753859
Implantable Lead Location: 753860
Implantable Lead Model: 377
Implantable Lead Model: 377
Implantable Lead Serial Number: 49470224
Implantable Lead Serial Number: 49485613
Implantable Pulse Generator Implant Date: 20170525
Lead Channel Impedance Value: 468 Ohm
Lead Channel Impedance Value: 468 Ohm
Lead Channel Pacing Threshold Amplitude: 0.9 V
Lead Channel Pacing Threshold Amplitude: 1.9 V
Lead Channel Pacing Threshold Pulse Width: 0.4 ms
Lead Channel Pacing Threshold Pulse Width: 0.4 ms
Lead Channel Sensing Intrinsic Amplitude: 3.2 mV
Lead Channel Sensing Intrinsic Amplitude: 7.9 mV
Lead Channel Setting Pacing Amplitude: 2.4 V
Lead Channel Setting Pacing Amplitude: 2.4 V
Lead Channel Setting Pacing Pulse Width: 0.4 ms
Pulse Gen Model: 394969
Pulse Gen Serial Number: 68798589

## 2023-05-03 ENCOUNTER — Ambulatory Visit: Payer: Medicare (Managed Care) | Attending: Physical Medicine and Rehabilitation | Admitting: Occupational Therapy

## 2023-05-03 DIAGNOSIS — R278 Other lack of coordination: Secondary | ICD-10-CM | POA: Diagnosis not present

## 2023-05-03 DIAGNOSIS — M6281 Muscle weakness (generalized): Secondary | ICD-10-CM | POA: Insufficient documentation

## 2023-05-03 DIAGNOSIS — R293 Abnormal posture: Secondary | ICD-10-CM | POA: Diagnosis not present

## 2023-05-03 DIAGNOSIS — G4733 Obstructive sleep apnea (adult) (pediatric): Secondary | ICD-10-CM | POA: Diagnosis not present

## 2023-05-03 DIAGNOSIS — R251 Tremor, unspecified: Secondary | ICD-10-CM | POA: Insufficient documentation

## 2023-05-03 DIAGNOSIS — R2681 Unsteadiness on feet: Secondary | ICD-10-CM | POA: Diagnosis not present

## 2023-05-03 NOTE — Therapy (Signed)
OUTPATIENT OCCUPATIONAL THERAPY [facility]  Patient Name: Jordan [name] MRN: [id number] DOB:1941/01/01, 82 y.o., male Today's Date: 05/03/2023  PCP: Jordan Blamer, MD  REFERRING PROVIDER: Horton Chin, MD  END OF SESSION:  OT End of Session - 05/03/23 1233     Visit Number 12    Number of Visits 21    Date for OT Re-Evaluation 06/02/23    Authorization Type [employer] - [place], no auth required    Progress Note Due on Visit 10    OT Start Time 1240   pt using bathroom first 10 minutes   OT Stop Time 1315    OT Time Calculation (min) 35 min    Activity Tolerance Patient tolerated treatment well    Behavior During Therapy WFL for tasks assessed/performed             Past Medical History:  Diagnosis Date   Arthritis    R shoulder, bone spur   Arthritis    "hands" (03/25/2016)   Chronic diastolic heart failure (HCC)    Chronic lower back pain    Coronary heart disease    Jordan [name]   Diverticulitis 12/2021   DVT (deep venous thrombosis) (HCC) 05/2022   left leg   H/O cardiovascular stress test    perhaps last one was 2009   Heart murmur    12/29/11 echo: mild MR, no AS, trivial TR   History of gout    Hyperlipidemia    Hypertension    saw Jordan. Mendel [name] last earl;y- 2014, cardiac cath. last done ?2009, blocks seen didn't require any intervention at that point.   Narcolepsy    [name] 04-11-97; Mean Latency 1.73min, SOREM 2   OSA on CPAP    NPSG 12-13-98 AHI 22.7   PONV (postoperative nausea and vomiting)    Presence of permanent cardiac pacemaker    Shortness of breath    with exertion    Stroke (HCC)    Type II diabetes mellitus (HCC)    Past Surgical History:  Procedure Laterality Date   CARDIAC CATHETERIZATION  2009?   EP IMPLANTABLE DEVICE N/A 03/25/2016   Procedure: Pacemaker Implant - Dual Chamber;  Surgeon: Jordan Maw, MD;  Location: [facility] INVASIVE CV LAB;  Service: Cardiovascular;  Laterality: N/A;   FRACTURE SURGERY     HIP  FRACTURE SURGERY     INSERT / REPLACE / REMOVE PACEMAKER  03/24/2016   INTRAMEDULLARY (IM) NAIL INTERTROCHANTERIC Left 02/08/2022   Procedure: INTRAMEDULLARY (IM) NAIL INTERTROCHANTRIC;  Surgeon: Jordan Copa, MD;  Location: [facility] OR;  Service: Orthopedics;  Laterality: Left;   IR KYPHO LUMBAR INC FX REDUCE BONE BX UNI/BIL CANNULATION INC/IMAGING  12/29/2021   IR RADIOLOGIST EVAL & MGMT  12/15/2021   IR RADIOLOGY PERIPHERAL GUIDED IV START  07/14/2022   IR US GUIDE VASC ACCESS RIGHT  07/15/2022   LEFT AND RIGHT HEART CATHETERIZATION WITH CORONARY ANGIOGRAM N/A 06/05/2014   Procedure: LEFT AND RIGHT HEART CATHETERIZATION WITH CORONARY ANGIOGRAM;  Surgeon: Jordan Noe, MD;  Location: [facility] CATH LAB;  Service: Cardiovascular;  Laterality: N/A;   NASAL FRACTURE SURGERY     SHOULDER ARTHROSCOPY WITH ROTATOR CUFF REPAIR AND SUBACROMIAL DECOMPRESSION Right 08/16/2013   Procedure: SHOULDER ARTHROSCOPY WITH ROTATOR CUFF REPAIR AND SUBACROMIAL DECOMPRESSION;  Surgeon: Jordan Paris, MD;  Location: [facility] OR;  Service: Orthopedics;  Laterality: Right;  Right shoulder arthroscopy rotator cuff repair, subacromial decompression.   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   Acute deep vein thrombosis (DVT) of left lower extremity (HCC) 04/29/2022   Closed intertrochanteric fracture of left hip, initial encounter [facility])    Closed left hip fracture (HCC) secondary to fall at home 02/08/2022   Paroxysmal atrial fibrillation (HCC) 02/08/2022   Debility 01/08/2022   Constipation 01/07/2022   Physical deconditioning 01/07/2022   Acute metabolic encephalopathy 01/04/2022   Hypomagnesemia 01/04/2022   Pancreatitis 12/30/2021   Parkinson's disease 12/21/2021   Parkinson disease 12/18/2021   Frequent falls 12/18/2021   History of CVA (cerebrovascular accident) 12/18/2021   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/2021   Hallucinations, visual 07/30/2021   Acetabulum fracture, left (HCC) 06/25/2021   Acute hip pain, left 06/24/2021   Fall at home, initial encounter 06/24/2021   Leukocytosis 06/24/2021   Chronic diastolic heart failure (HCC)    Sinusitis, acute maxillary 06/24/2020   AKI (acute kidney injury) (HCC)    Labile blood glucose    Cough    Benign essential HTN    Skin lesion of hand    Hypoalbuminemia due to protein-calorie malnutrition (HCC)    Controlled type 2 diabetes mellitus with hyperglycemia, without long-term current use of insulin (HCC)    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   Morbid obesity (HCC)    Acute blood loss anemia    Acute thalamic infarction (HCC) 04/15/2020   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   TIA (transient ischemic attack) 02/17/2018   Type 2 diabetes mellitus with hyperlipidemia (HCC) 02/17/2018   Insomnia 02/01/2018   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   Chronotropic incompetence 07/09/2014   Dyspnea on exertion 03/03/2014   HLD (hyperlipidemia) 12/13/2007   Essential hypertension 12/13/2007   Class 1 obesity 12/05/2007   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 Pt has extended release carbidopa-levodopa over night  Pt accompanied by:  wife, Jordan [name]  PERTINENT HISTORY: "Jordan [name] is an 82 year old man who presents for follow-up after hip fracture.    1) Left hip fracture:  -not walking much, but can walk with  rolling walker I condominium unit.  -his wife accompanies him today and provides most of history -his walking discharge is very short.  -no longer painful -has weakness in bilateral lower extremities -no more falls.    2) Parkinson's: -sometimes muscles don't cooperate the way they should. -he has vascular parkinsonism -has stooped posture   3) Right leg weakness: -when walking.  -wife is nervous about him falling -he was in skilled rehab and was discharged June 8th  -they have not yet set up outpatient therapy -he has not had any professional therapy since discharge on June 8th.    4) 6 bottom teeth removed -has more of a challenge in forming words.    5) Neuroendocrine tumor -being treated with monthly octreotide -no current symptoms currently -was having persistent abdominal pain- they thought it was a flare of diverticulitis but this was not present -CBGs increase because of octreotide- CBGs as high as 250/260 -he asks  for icecream   He was  diagnosed with a vascular Parkinsonism in Jan 2022 and has history of CVA in June 2021 per pt report and spouse. Pt fell and sustained hip fx on 02/08/22 and pt underwent left  IM nailing.  04/30/22:  hospitalized with blood clot LLE--changed meds and cleared to resume OT/PT."  PRECAUTIONS: Fall  WEIGHT BEARING RESTRICTIONS: No  PAIN:  Are you having pain? No  FALLS: Has patient fallen in last 6 months? No  Lives with: lives with their spouse Lives in: Other Condo Stairs:  On the 10th floor, there are 2 elevators  Has following equipment at home: Dan Humphreys - 2 wheeled, shower chair, Grab bars, and transport chair   PLOF: Independent with household mobility with device and Needs assistance with ADLs Wife helps with ADLs, pt can get dressed and bathe himself it just takes longer. "Socks take forever"   PATIENT GOALS: Improve UE functional use  OBJECTIVE:   HAND DOMINANCE: Right  ADLs: Overall ADLs: min to moderate assistance  (requires more assistance due to time constraints)  IADLs: Handwriting: 25% legible, Increased time, and Severe micrographia  MOBILITY STATUS: Needs Assist: uses transport chair, dependent for navigation  ACTIVITY TOLERANCE: Activity tolerance: fair  FUNCTIONAL OUTCOME MEASURES: PSFS:  COORDINATION: Box and Blocks:  Right 31 blocks, Left 34 blocks  UE ROM:  WFL  UE MMT:   WFL  SENSATION: Reports paresthesias in R hand all fingers  MUSCLE TONE: not fully tested this visit  COGNITION: Overall cognitive status: Impaired  OBSERVATIONS: Bradykinesia and Postural tremors   TODAY'S TREATMENT:                                                                                                                               Discussed preventing constipation and other PD related complications.   Reviewed strategies for hooking/unhooking buttons and practiced 3 buttons. Attempted to don/doff jacket in standing - pt required min assist to don/doff and for balance.   Worked on [employer]! Up for sit to stand without using hands to push up, holding boomwhackers  Worked on diagonal patterns with visual targets holding boomwhackers with cues to turn head and look with eyes to increase trunk rotation and increase neck ROM    PATIENT EDUCATION: Education details: PWR Boosts/completion Person educated: Patient and Spouse Education method: Programmer, multimedia, Facilities manager, and Handouts Education comprehension: verbalized understanding, returned demonstration, and needs further education  HOME EXERCISE PROGRAM: 5/9: pen tricks 03/22/23: review and reprint of coordination HEP, memory strategies, thinking skills, PD ex chart from 06/2022 03/29/23: PWR! Seated, handwriting strategies 04/05/23: neck ex's, ways to prevent future PD related complications, ADL strategies   GOALS:  SHORT TERM GOALS: Target date: 03/11/2023    Pt will be independent with PD specific HEP.  Baseline: not yet initiated Goal  status: MET  2.  Pt will verbalize understanding of adapted strategies to maximize safety and independence with ADLs/IADLs.  Baseline: not yet initiated  Goal status: MET  3.  Pt will write a sentence with no significant decrease in size and maintain 50% legibility.  Baseline: 25% legibility Goal status: IN PROGRESS  4.  Will assess 9 hole peg and set appropriate LTG.  Baseline:  04/07/2023 R- 44 seconds; 42 seconds; 41 seconds: L- 46 seconds; 46 seconds Goal status: IN PROGRESS   5.  Will perform 3 button/button test in under 90 sec.  Baseline:  04/07/2023 - able to button 3 buttons in 3 minutes - improved to 92 seconds with cues for PWR! Hands and angry button technique Goal status: MET (55 sec)   6.  Will perform PPT#4 (don/doff jacket) in 1 minute w/o assist Baseline: 04/26/23: seated = 2 min. 22 sec      04/26/23: standing = 1 min. 13 sec. W/ min assist Goal status: Updated/revised   LONG TERM GOALS: Target date: 04/22/2023    Pt will be able to place at least 35 blocks using each hand with completion of Box and Blocks test. Baseline: Right 31 blocks, Left 34 blocks Goal status: INITIAL  2.  Patient will report at least two-point increase in average PSFS score or at least three-point increase in a single activity score indicating functionally significant improvement given minimum detectable change. Baseline: 3 total score (see above for single activity scores) Goal status: INITIAL  3.  Pt will verbalize understanding of ways to prevent future PD related complications and PD community resources.  Baseline: not yet initiated Goal status: PARTIALLY MET (issued first part)   4.  Pt will write a sentence with no significant decrease in size and maintain 75% legibility.  Baseline: 25% legibility Goal status: INITIAL  5. Patient will complete nine-hole peg with use of R in 35 seconds and with use of L in 40 seconds or less.  Baseline: R- 41 seconds; L- 46 seconds Goal  status: INITIAL  ASSESSMENT:  CLINICAL IMPRESSION: Pt demonstrates progression towards goals; however remains limited by cognition and motivation as needed to reach max rehab potential. Will continue to encourage carryover of HEP and education as needed to improve independence and safety with ADLs and IADLs.  PERFORMANCE DEFICITS: in functional skills including ADLs, IADLs, coordination, sensation, strength, pain, Fine motor control, Gross motor control, mobility, balance, endurance, decreased knowledge of use of DME, and UE functional use, cognitive skills including attention, memory, problem solving, and safety awareness.  IMPAIRMENTS: are limiting patient from ADLs, IADLs, rest and sleep, leisure, and social participation.   COMORBIDITIES:  may have co-morbidities  that affects occupational performance. Patient will benefit from skilled OT to address above impairments and improve overall function.  REHAB POTENTIAL: Fair given PD prognosis   PLAN:  OT FREQUENCY: 2x/week  OT DURATION: 6 additional weeks (beginning week of 04/26/23)   PLANNED INTERVENTIONS: self care/ADL training, therapeutic exercise, therapeutic activity, manual therapy, passive range of motion, balance training, functional mobility training, splinting, ultrasound, paraffin, fluidotherapy, moist heat, patient/family education, cognitive remediation/compensation, energy conservation, coping strategies training, DME and/or AE instructions, and Re-evaluation  RECOMMENDED OTHER SERVICES: none at this time  CONSULTED AND AGREED WITH PLAN OF CARE: Patient and family member/caregiver  PLAN FOR NEXT SESSION: continue to work towards remaining STGs, work on pull over shirt, and donning/doffing jacket sitting EOB   Sheran Lawless, OT 05/03/2023, 12:45 PM

## 2023-05-04 DIAGNOSIS — M9903 Segmental and somatic dysfunction of lumbar region: Secondary | ICD-10-CM | POA: Diagnosis not present

## 2023-05-04 DIAGNOSIS — M9905 Segmental and somatic dysfunction of pelvic region: Secondary | ICD-10-CM | POA: Diagnosis not present

## 2023-05-04 DIAGNOSIS — M5136 Other intervertebral disc degeneration, lumbar region: Secondary | ICD-10-CM | POA: Diagnosis not present

## 2023-05-04 DIAGNOSIS — M9904 Segmental and somatic dysfunction of sacral region: Secondary | ICD-10-CM | POA: Diagnosis not present

## 2023-05-06 ENCOUNTER — Ambulatory Visit: Payer: Medicare (Managed Care) | Admitting: Occupational Therapy

## 2023-05-09 ENCOUNTER — Other Ambulatory Visit: Payer: Self-pay | Admitting: Neurology

## 2023-05-12 NOTE — Progress Notes (Signed)
Remote pacemaker transmission.   

## 2023-05-16 DIAGNOSIS — R310 Gross hematuria: Secondary | ICD-10-CM | POA: Diagnosis not present

## 2023-05-18 ENCOUNTER — Telehealth: Payer: Self-pay | Admitting: Internal Medicine

## 2023-05-18 NOTE — Telephone Encounter (Signed)
Hilda Lias advised that I faxed the note per her request.

## 2023-05-18 NOTE — Telephone Encounter (Signed)
Marie from Dr. Elliot Dally office called to see if we can fax over this from Robin Searing:  Mar 24, 2023 [name], NP      03/24/23  2:06 PM Note     Patient Name: Jordan [name]  DOB: 1941-01-18 MRN: [id number]   Primary Cardiologist: None   Chart reviewed as part of pre-operative protocol coverage.    SBE prophylaxis is not required for the patient from a cardiac standpoint.   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  Guidance for holding if needed should come from prescribing physician and not cardiology.   I will route this recommendation to the requesting party via Epic fax function and remove from pre-op pool.   Please call with questions.   Napoleon Form, Leodis Rains, NP 03/24/2023, 2:04 PM       Please fax to [phone]

## 2023-05-23 ENCOUNTER — Ambulatory Visit: Payer: Medicare (Managed Care) | Admitting: Occupational Therapy

## 2023-05-23 DIAGNOSIS — R278 Other lack of coordination: Secondary | ICD-10-CM

## 2023-05-23 DIAGNOSIS — M6281 Muscle weakness (generalized): Secondary | ICD-10-CM

## 2023-05-23 DIAGNOSIS — R293 Abnormal posture: Secondary | ICD-10-CM

## 2023-05-23 DIAGNOSIS — R251 Tremor, unspecified: Secondary | ICD-10-CM

## 2023-05-23 NOTE — Therapy (Signed)
OUTPATIENT OCCUPATIONAL THERAPY [facility]  Patient Name: Jordan [name] MRN: [id number] DOB:Jan 07, 1941, 82 y.o., male Today's Date: 05/23/2023  PCP: Johny Blamer, MD  REFERRING PROVIDER: Horton Chin, MD  END OF SESSION:  OT End of Session - 05/23/23 1613     Visit Number 13    Number of Visits 21    Date for OT Re-Evaluation 06/02/23    Authorization Type [employer] - [place], no auth required    Progress Note Due on Visit 10    OT Start Time 1615    Activity Tolerance Patient tolerated treatment well    Behavior During Therapy [facility] for tasks assessed/performed             Past Medical History:  Diagnosis Date   Arthritis    R shoulder, bone spur   Arthritis    "hands" (03/25/2016)   Chronic diastolic heart failure (HCC)    Chronic lower back pain    Coronary heart disease    Dr Garnette Scheuermann   Diverticulitis 12/2021   DVT (deep venous thrombosis) (HCC) 05/2022   left leg   H/O cardiovascular stress test    perhaps last one was 2009   Heart murmur    12/29/11 echo: mild MR, no AS, trivial TR   History of gout    Hyperlipidemia    Hypertension    saw Dr. Mendel Ryder last earl;y- 2014, cardiac cath. last done ?2009, blocks seen didn't require any intervention at that point.   Narcolepsy    [name] 04-11-97; Mean Latency 1.1min, SOREM 2   OSA on CPAP    NPSG 12-13-98 AHI 22.7   PONV (postoperative nausea and vomiting)    Presence of permanent cardiac pacemaker    Shortness of breath    with exertion    Stroke (HCC)    Type II diabetes mellitus (HCC)    Past Surgical History:  Procedure Laterality Date   CARDIAC CATHETERIZATION  2009?   EP IMPLANTABLE DEVICE N/A 03/25/2016   Procedure: Pacemaker Implant - Dual Chamber;  Surgeon: Marinus Maw, MD;  Location: [facility] INVASIVE CV LAB;  Service: Cardiovascular;  Laterality: N/A;   FRACTURE SURGERY     HIP FRACTURE SURGERY     INSERT / REPLACE / REMOVE PACEMAKER  03/24/2016   INTRAMEDULLARY (IM) NAIL  INTERTROCHANTERIC Left 02/08/2022   Procedure: INTRAMEDULLARY (IM) NAIL INTERTROCHANTRIC;  Surgeon: Cammy Copa, MD;  Location: [facility] OR;  Service: Orthopedics;  Laterality: Left;   IR KYPHO LUMBAR INC FX REDUCE BONE BX UNI/BIL CANNULATION INC/IMAGING  12/29/2021   IR RADIOLOGIST EVAL & MGMT  12/15/2021   IR RADIOLOGY PERIPHERAL GUIDED IV START  07/14/2022   IR US GUIDE VASC ACCESS RIGHT  07/15/2022   LEFT AND RIGHT HEART CATHETERIZATION WITH CORONARY ANGIOGRAM N/A 06/05/2014   Procedure: LEFT AND RIGHT HEART CATHETERIZATION WITH CORONARY ANGIOGRAM;  Surgeon: Lesleigh Noe, MD;  Location: [facility] CATH LAB;  Service: Cardiovascular;  Laterality: N/A;   NASAL FRACTURE SURGERY     SHOULDER ARTHROSCOPY WITH ROTATOR CUFF REPAIR AND SUBACROMIAL DECOMPRESSION Right 08/16/2013   Procedure: SHOULDER ARTHROSCOPY WITH ROTATOR CUFF REPAIR AND SUBACROMIAL DECOMPRESSION;  Surgeon: Mable Paris, MD;  Location: [facility] OR;  Service: Orthopedics;  Laterality: Right;  Right shoulder arthroscopy rotator cuff repair, subacromial decompression.   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   Acute deep vein thrombosis (DVT)  of left lower extremity (HCC) 04/29/2022   Closed intertrochanteric fracture of left hip, initial encounter [facility])    Closed left hip fracture (HCC) secondary to fall at home 02/08/2022   Paroxysmal atrial fibrillation (HCC) 02/08/2022   Debility 01/08/2022   Constipation 01/07/2022   Physical deconditioning 01/07/2022   Acute metabolic encephalopathy 01/04/2022   Hypomagnesemia 01/04/2022   Pancreatitis 12/30/2021   Parkinson's disease 12/21/2021   Parkinson disease 12/18/2021   Frequent falls 12/18/2021   History of CVA (cerebrovascular accident) 12/18/2021   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   Hallucinations, visual 07/30/2021   Acetabulum fracture, left (HCC) 06/25/2021   Acute  hip pain, left 06/24/2021   Fall at home, initial encounter 06/24/2021   Leukocytosis 06/24/2021   Chronic diastolic heart failure (HCC)    Sinusitis, acute maxillary 06/24/2020   AKI (acute kidney injury) (HCC)    Labile blood glucose    Cough    Benign essential HTN    Skin lesion of hand    Hypoalbuminemia due to protein-calorie malnutrition (HCC)    Controlled type 2 diabetes mellitus with hyperglycemia, without long-term current use of insulin (HCC)    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   Morbid obesity (HCC)    Acute blood loss anemia    Acute thalamic infarction (HCC) 04/15/2020   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   TIA (transient ischemic attack) 02/17/2018   Type 2 diabetes mellitus with hyperlipidemia (HCC) 02/17/2018   Insomnia 02/01/2018   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   Chronotropic incompetence 07/09/2014   Dyspnea on exertion 03/03/2014   HLD (hyperlipidemia) 12/13/2007   Essential hypertension 12/13/2007   Class 1 obesity 12/05/2007   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  Pt accompanied by:  wife, [name]  PERTINENT HISTORY: "[name] is an 82 year old man who presents for follow-up after hip fracture.    1) Left hip fracture:  -not walking much, but can walk with rolling walker I condominium unit.  -his wife accompanies him today and provides most of history -his walking discharge is very short.  -no longer painful -has weakness in  bilateral lower extremities -no more falls.    2) Parkinson's: -sometimes muscles don't cooperate the way they should. -he has vascular parkinsonism -has stooped posture   3) Right leg weakness: -when walking.  -wife is nervous about him falling -he was in skilled rehab and was discharged June 8th  -they have not yet set up outpatient therapy -he has not had any professional therapy since discharge on June 8th.    4) 6 bottom teeth removed -has more of a challenge in forming words.    5) Neuroendocrine tumor -being treated with monthly octreotide -no current symptoms currently -was having persistent abdominal pain- they thought it was a flare of diverticulitis but this was not present -CBGs increase because of octreotide- CBGs as high as 250/260 -he asks for icecream   He was  diagnosed with a vascular Parkinsonism in Jan 2022 and has history of CVA in June 2021 per pt report and spouse. Pt fell and sustained hip fx on  02/08/22 and pt underwent left  IM nailing.  04/30/22:  hospitalized with blood clot LLE--changed meds and cleared to resume OT/PT."  PRECAUTIONS: Fall  WEIGHT BEARING RESTRICTIONS: No  PAIN:  Are you having pain? No  FALLS: Has patient fallen in last 6 months? No  Lives with: lives with their spouse Lives in: Other Condo Stairs:  On the 10th floor, there are 2 elevators  Has following equipment at home: Dan Humphreys - 2 wheeled, shower chair, Grab bars, and transport chair   PLOF: Independent with household mobility with device and Needs assistance with ADLs Wife helps with ADLs, pt can get dressed and bathe himself it just takes longer. "Socks take forever"   PATIENT GOALS: Improve UE functional use  OBJECTIVE:   HAND DOMINANCE: Right  ADLs: Overall ADLs: min to moderate assistance (requires more assistance due to time constraints)  IADLs: Handwriting: 25% legible, Increased time, and Severe micrographia  MOBILITY STATUS: Needs Assist: uses transport  chair, dependent for navigation  ACTIVITY TOLERANCE: Activity tolerance: fair  FUNCTIONAL OUTCOME MEASURES: PSFS:  COORDINATION: Box and Blocks:  Right 31 blocks, Left 34 blocks  UE ROM:  WFL  UE MMT:   WFL  SENSATION: Reports paresthesias in R hand all fingers  MUSCLE TONE: not fully tested this visit  COGNITION: Overall cognitive status: Impaired  OBSERVATIONS: Bradykinesia and Postural tremors   TODAY'S TREATMENT:                                                                                                                               Initiated PWR! Hands as noted in pt instructions. Reviewed Weekly PD specific HEP calendar  PATIENT EDUCATION: Education details: PWR! Hands Person educated: Patient and Spouse Education method: Explanation, Demonstration, and Handouts Education comprehension: verbalized understanding, returned demonstration, and needs further education  HOME EXERCISE PROGRAM: 5/9: pen tricks 03/22/23: review and reprint of coordination HEP, memory strategies, thinking skills, PD ex chart from 06/2022 03/29/23: PWR! Seated, handwriting strategies 04/05/23: neck ex's, ways to prevent future PD related complications, ADL strategies  05/23/2023: PWR! Hands  GOALS:  SHORT TERM GOALS: Target date: 03/11/2023    Pt will be independent with PD specific HEP.  Baseline: not yet initiated Goal status: MET  2.  Pt will verbalize understanding of adapted strategies to maximize safety and independence with ADLs/IADLs.  Baseline: not yet initiated Goal status: MET  3.  Pt will write a sentence with no significant decrease in size and maintain 50% legibility.  Baseline: 25% legibility Goal status: IN PROGRESS  4.  Will assess 9 hole peg and set appropriate LTG.  Baseline:  04/07/2023 R- 44 seconds; 42 seconds; 41 seconds: L- 46 seconds; 46 seconds Goal status: IN PROGRESS   5.  Will perform 3 button/button test in under 90 sec.  Baseline:  04/07/2023 -  able to button 3 buttons in 3 minutes - improved to 92 seconds with cues for PWR! Hands and angry button  technique Goal status: MET (55 sec)   6.  Will perform PPT#4 (don/doff jacket) in 1 minute w/o assist Baseline: 04/26/23: seated = 2 min. 22 sec      04/26/23: standing = 1 min. 13 sec. W/ min assist Goal status: Updated/revised   LONG TERM GOALS: Target date: 04/22/2023    Pt will be able to place at least 35 blocks using each hand with completion of Box and Blocks test. Baseline: Right 31 blocks, Left 34 blocks Goal status: INITIAL  2.  Patient will report at least two-point increase in average PSFS score or at least three-point increase in a single activity score indicating functionally significant improvement given minimum detectable change. Baseline: 3 total score (see above for single activity scores) Goal status: INITIAL  3.  Pt will verbalize understanding of ways to prevent future PD related complications and PD community resources.  Baseline: not yet initiated Goal status: PARTIALLY MET (issued first part)   4.  Pt will write a sentence with no significant decrease in size and maintain 75% legibility.  Baseline: 25% legibility Goal status: INITIAL  5. Patient will complete nine-hole peg with use of R in 35 seconds and with use of L in 40 seconds or less.  Baseline: R- 41 seconds; L- 46 seconds Goal status: INITIAL  ASSESSMENT:  CLINICAL IMPRESSION: Pt demonstrates good understanding of HEP as needed to progress towards goals. Pt was not scheduled out to end of POC. Will attempt to schedule remaining 3 visits until 8/1, but pt will likely d/c at next visit.  PERFORMANCE DEFICITS: in functional skills including ADLs, IADLs, coordination, sensation, strength, pain, Fine motor control, Gross motor control, mobility, balance, endurance, decreased knowledge of use of DME, and UE functional use, cognitive skills including attention, memory, problem solving, and safety  awareness.  IMPAIRMENTS: are limiting patient from ADLs, IADLs, rest and sleep, leisure, and social participation.   COMORBIDITIES:  may have co-morbidities  that affects occupational performance. Patient will benefit from skilled OT to address above impairments and improve overall function.  REHAB POTENTIAL: Fair given PD prognosis   PLAN:  OT FREQUENCY: 2x/week  OT DURATION: 6 additional weeks (beginning week of 04/26/23)   PLANNED INTERVENTIONS: self care/ADL training, therapeutic exercise, therapeutic activity, manual therapy, passive range of motion, balance training, functional mobility training, splinting, ultrasound, paraffin, fluidotherapy, moist heat, patient/family education, cognitive remediation/compensation, energy conservation, coping strategies training, DME and/or AE instructions, and Re-evaluation  RECOMMENDED OTHER SERVICES: none at this time  CONSULTED AND AGREED WITH PLAN OF CARE: Patient and family member/caregiver  PLAN FOR NEXT SESSION: D/C?; continue to work towards remaining STGs, work on pull over shirt, and donning/doffing jacket sitting EOB   [employer], OT 05/23/2023, 4:19 PM

## 2023-05-23 NOTE — Patient Instructions (Addendum)
(  Exercise) Monday Tuesday Wednesday Thursday Friday Saturday Sunday    [facility] Exercises                    PWR! Sitting                   Coordination  Exercises/ Pen Tricks                  Thinking Activities                   Recumbent   Bike                  Jacket  Exercise         PWR! Hands         PT Exercises         Thinking Activities          PWR! Hand Exercises  Then, start with elbows bent and hands closed:  PWR! Up: Push hands out BIG. Elbows straight, wrists up, fingers open and spread apart BIG.   PWR! Step: Touch index finger to thumb while keeping other fingers straight. Flick fingers out BIG (thumb out/straighten fingers). Repeat with other fingers. (Step your thumb to each finger).  With arms stretched out in front of you (elbows straight), perform the following:  PWR! Rock:  Move wrists up and down [employer]! Twist: Twist palms up and down BIG  ** Make each movement big and deliberate so that you feel the movement.  Perform at least 10 repetitions 1x/day, but perform PWR! Hands throughout the day when you are having trouble using your hands (picking up/manipulating small objects, writing, eating, typing, sewing, buttoning, etc.).

## 2023-05-24 DIAGNOSIS — N281 Cyst of kidney, acquired: Secondary | ICD-10-CM | POA: Diagnosis not present

## 2023-05-24 DIAGNOSIS — R310 Gross hematuria: Secondary | ICD-10-CM | POA: Diagnosis not present

## 2023-05-24 DIAGNOSIS — N200 Calculus of kidney: Secondary | ICD-10-CM | POA: Diagnosis not present

## 2023-05-24 DIAGNOSIS — N401 Enlarged prostate with lower urinary tract symptoms: Secondary | ICD-10-CM | POA: Diagnosis not present

## 2023-05-24 DIAGNOSIS — R350 Frequency of micturition: Secondary | ICD-10-CM | POA: Diagnosis not present

## 2023-05-24 DIAGNOSIS — N3941 Urge incontinence: Secondary | ICD-10-CM | POA: Diagnosis not present

## 2023-05-27 ENCOUNTER — Encounter: Payer: Self-pay | Admitting: Occupational Therapy

## 2023-05-27 ENCOUNTER — Ambulatory Visit: Payer: Medicare (Managed Care) | Admitting: Occupational Therapy

## 2023-05-27 DIAGNOSIS — R293 Abnormal posture: Secondary | ICD-10-CM

## 2023-05-27 DIAGNOSIS — R278 Other lack of coordination: Secondary | ICD-10-CM

## 2023-05-27 DIAGNOSIS — R251 Tremor, unspecified: Secondary | ICD-10-CM

## 2023-05-27 DIAGNOSIS — M6281 Muscle weakness (generalized): Secondary | ICD-10-CM

## 2023-05-27 NOTE — Therapy (Signed)
OUTPATIENT OCCUPATIONAL THERAPY [facility]  Patient Name: Jordan [name] MRN: [id number] DOB:05-Jul-1941, 82 y.o., male Today's Date: 05/27/2023  OCCUPATIONAL THERAPY DISCHARGE SUMMARY  Visits from Start of Care: 14  Current functional level related to goals / functional outcomes: Limited progression towards goals.    Remaining deficits: Pt remains limited by    Education / Equipment: Continue with PD specific HEP and strategies as needed for further remediation efforts.    Patient agrees to discharge. Patient goals were partially met. Patient is being discharged due to  limited carryover of HEP and progression towards goals.Pt has PD specific HEP as needed for home remediation efforts. Pt should return in 3 months for multi-disciplinary PD screens.    PCP: Johny Blamer, MD  REFERRING PROVIDER: Horton Chin, MD  END OF SESSION:  OT End of Session - 05/27/23 1110     Visit Number 14    Number of Visits 21    Date for OT Re-Evaluation 06/02/23    Authorization Type [employer] - [place], no auth required    Progress Note Due on Visit 10    OT Start Time 1105    OT Stop Time 1149    OT Time Calculation (min) 44 min    Activity Tolerance Patient tolerated treatment well    Behavior During Therapy WFL for tasks assessed/performed              Past Medical History:  Diagnosis Date   Arthritis    R shoulder, bone spur   Arthritis    "hands" (03/25/2016)   Chronic diastolic heart failure (HCC)    Chronic lower back pain    Coronary heart disease    Dr Garnette Scheuermann   Diverticulitis 12/2021   DVT (deep venous thrombosis) (HCC) 05/2022   left leg   H/O cardiovascular stress test    perhaps last one was 2009   Heart murmur    12/29/11 echo: mild MR, no AS, trivial TR   History of gout    Hyperlipidemia    Hypertension    saw Dr. Mendel Ryder last earl;y- 2014, cardiac cath. last done ?2009, blocks seen didn't require any intervention at  that point.   Narcolepsy    [name] 04-11-97; Mean Latency 1.49min, SOREM 2   OSA on CPAP    NPSG 12-13-98 AHI 22.7   PONV (postoperative nausea and vomiting)    Presence of permanent cardiac pacemaker    Shortness of breath    with exertion    Stroke (HCC)    Type II diabetes mellitus (HCC)    Past Surgical History:  Procedure Laterality Date   CARDIAC CATHETERIZATION  2009?   EP IMPLANTABLE DEVICE N/A 03/25/2016   Procedure: Pacemaker Implant - Dual Chamber;  Surgeon: Marinus Maw, MD;  Location[facility] INVASIVE CV LAB;  Service: Cardiovascular;  Laterality: N/A;   FRACTURE SURGERY     HIP FRACTURE SURGERY     INSERT / REPLACE / REMOVE PACEMAKER  03/24/2016   INTRAMEDULLARY (IM) NAIL INTERTROCHANTERIC Left 02/08/2022   Procedure: INTRAMEDULLARY (IM) NAIL INTERTROCHANTRIC;  Surgeon: Cammy Copa, MD;  Location: [facility] OR;  Service: Orthopedics;  Laterality: Left;   IR KYPHO LUMBAR INC FX REDUCE BONE BX UNI/BIL CANNULATION INC/IMAGING  12/29/2021   IR RADIOLOGIST EVAL & MGMT  12/15/2021   IR RADIOLOGY PERIPHERAL GUIDED IV START  07/14/2022   IR US GUIDE VASC ACCESS RIGHT  07/15/2022   LEFT AND RIGHT HEART CATHETERIZATION WITH CORONARY  ANGIOGRAM N/A 06/05/2014   Procedure: LEFT AND RIGHT HEART CATHETERIZATION WITH CORONARY ANGIOGRAM;  Surgeon: Lesleigh Noe, MD;  Location: [facility] CATH LAB;  Service: Cardiovascular;  Laterality: N/A;   NASAL FRACTURE SURGERY     SHOULDER ARTHROSCOPY WITH ROTATOR CUFF REPAIR AND SUBACROMIAL DECOMPRESSION Right 08/16/2013   Procedure: SHOULDER ARTHROSCOPY WITH ROTATOR CUFF REPAIR AND SUBACROMIAL DECOMPRESSION;  Surgeon: Mable Paris, MD;  Location: [facility] OR;  Service: Orthopedics;  Laterality: Right;  Right shoulder arthroscopy rotator cuff repair, subacromial decompression.   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   Acute deep vein thrombosis (DVT)  of left lower extremity (HCC) 04/29/2022   Closed intertrochanteric fracture of left hip, initial encounter [facility])    Closed left hip fracture (HCC) secondary to fall at home 02/08/2022   Paroxysmal atrial fibrillation (HCC) 02/08/2022   Debility 01/08/2022   Constipation 01/07/2022   Physical deconditioning 01/07/2022   Acute metabolic encephalopathy 01/04/2022   Hypomagnesemia 01/04/2022   Pancreatitis 12/30/2021   Parkinson's disease 12/21/2021   Parkinson disease 12/18/2021   Frequent falls 12/18/2021   History of CVA (cerebrovascular accident) 12/18/2021   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   Hallucinations, visual 07/30/2021   Acetabulum fracture, left (HCC) 06/25/2021   Acute hip pain, left 06/24/2021   Fall at home, initial encounter 06/24/2021   Leukocytosis 06/24/2021   Chronic diastolic heart failure (HCC)    Sinusitis, acute maxillary 06/24/2020   AKI (acute kidney injury) (HCC)    Labile blood glucose    Cough    Benign essential HTN    Skin lesion of hand    Hypoalbuminemia due to protein-calorie malnutrition (HCC)    Controlled type 2 diabetes mellitus with hyperglycemia, without long-term current use of insulin (HCC)    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   Morbid obesity (HCC)    Acute blood loss anemia    Acute thalamic infarction (HCC) 04/15/2020   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   TIA (transient ischemic attack) 02/17/2018   Type 2 diabetes mellitus with hyperlipidemia (HCC) 02/17/2018   Insomnia 02/01/2018   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   Chronotropic incompetence 07/09/2014   Dyspnea on exertion 03/03/2014   HLD (hyperlipidemia) 12/13/2007   Essential hypertension 12/13/2007   Class 1 obesity 12/05/2007   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 [name] normally puts his shirts on for him and his jackets.   Pt accompanied by:  wife, [name]  PERTINENT HISTORY: "Mr. Armbrecht is an 82 year old man who presents for follow-up after hip fracture.    1) Left hip fracture:  -not walking much, but can walk with rolling walker I condominium unit.  -his wife accompanies him today and provides most of history -his walking discharge is very short.  -no longer painful -has weakness in bilateral lower extremities -no more falls.    2) Parkinson's: -sometimes muscles don't cooperate the way they should. -he has vascular parkinsonism -has stooped posture   3) Right leg weakness: -when walking.  -  wife is nervous about him falling -he was in skilled rehab and was discharged June 8th  -they have not yet set up outpatient therapy -he has not had any professional therapy since discharge on June 8th.    4) 6 bottom teeth removed -has more of a challenge in forming words.    5) Neuroendocrine tumor -being treated with monthly octreotide -no current symptoms currently -was having persistent abdominal pain- they thought it was a flare of diverticulitis but this was not present -CBGs increase because of octreotide- CBGs as high as 250/260 -he asks for icecream   He was  diagnosed with a vascular Parkinsonism in Jan 2022 and has history of CVA in June 2021 per pt report and spouse. Pt fell and sustained hip fx on 02/08/22 and pt underwent left  IM nailing.  04/30/22:  hospitalized with blood clot LLE--changed meds and cleared to resume OT/PT."  PRECAUTIONS: Fall  WEIGHT BEARING RESTRICTIONS: No  PAIN:  Are you having pain?  No  FALLS: Has patient fallen in last 6 months? No  Lives with: lives with their spouse Lives in: Other Condo Stairs:  On the 10th floor, there are 2 elevators  Has following equipment at home: Dan Humphreys - 2 wheeled, shower chair, Grab bars, and transport chair   PLOF: Independent with household mobility with device and Needs assistance with ADLs Wife helps with ADLs, pt can get dressed and bathe himself it just takes longer. "Socks take forever"   PATIENT GOALS: Improve UE functional use  OBJECTIVE:   HAND DOMINANCE: Right  ADLs: Overall ADLs: min to moderate assistance (requires more assistance due to time constraints)  IADLs: Handwriting: 25% legible, Increased time, and Severe micrographia  MOBILITY STATUS: Needs Assist: uses transport chair, dependent for navigation  ACTIVITY TOLERANCE: Activity tolerance: fair  FUNCTIONAL OUTCOME MEASURES: PSFS:  COORDINATION: Box and Blocks:  Right 31 blocks, Left 34 blocks  UE ROM:  WFL  UE MMT:   WFL  SENSATION: Reports paresthesias in R hand all fingers  MUSCLE TONE: not fully tested this visit  COGNITION: Overall cognitive status: Impaired  OBSERVATIONS: Bradykinesia and Postural tremors   TODAY'S TREATMENT:                                                                                                                               - Self-care/home management completed for duration as noted below including: Reviewed jacket donning and doffing as noted in pt instructions to reflect sitting position vs standing for balance. Reviewed strategy for donning of pull-over shirt.  Objective measures assessed as noted in Goals section to determine progression towards goals. Pt encouraged to continue HEP completion at home due to little to no change with respect to progression towards goals.   PATIENT EDUCATION: Education details: OT d/c; PD screen; ADL strategies and HEP Person educated: Patient and Spouse Education method:  Explanation, Demonstration, and Handouts Education comprehension: verbalized understanding and returned demonstration  HOME  EXERCISE PROGRAM: 5/9: pen tricks 03/22/23: review and reprint of coordination HEP, memory strategies, thinking skills, PD ex chart from 06/2022 03/29/23: PWR! Seated, handwriting strategies 04/05/23: neck ex's, ways to prevent future PD related complications, ADL strategies   GOALS:  SHORT TERM GOALS: Target date: 03/11/2023    Pt will be independent with PD specific HEP.  Baseline: not yet initiated Goal status: MET  2.  Pt will verbalize understanding of adapted strategies to maximize safety and independence with ADLs/IADLs.  Baseline: not yet initiated Goal status: MET  3.  Pt will write a sentence with no significant decrease in size and maintain 50% legibility.  Baseline: 25% legibility Goal status: IN PROGRESS  4.  Will assess 9 hole peg and set appropriate LTG.  Baseline:  04/07/2023 R- 44 seconds; 42 seconds; 41 seconds: L- 46 seconds; 46 seconds 05/27/2023: R 43; L - 43 Goal status: IN PROGRESS   5.  Will perform 3 button/button test in under 90 sec.  Baseline:  04/07/2023 - able to button 3 buttons in 3 minutes - improved to 92 seconds with cues for PWR! Hands and angry button technique Goal status: MET (55 sec)   6.  Will perform PPT#4 (don/doff jacket) in 1 minute w/o assist Baseline: 04/26/23: seated = 2 min. 22 sec      04/26/23: standing = 1 min. 13 sec. W/ min assist 05/27/2023: no change Goal status: Updated/revised   LONG TERM GOALS: Target date: 04/22/2023    Pt will be able to place at least 35 blocks using each hand with completion of Box and Blocks test. Baseline: Right 31 blocks, Left 34 blocks Goal status: NOT FULLY MET  2.  Patient will report at least two-point increase in average PSFS score or at least three-point increase in a single activity score indicating functionally significant improvement given minimum detectable  change. Baseline: 3 total score (see above for single activity scores) Goal status: NOT FULLY MET  3.  Pt will verbalize understanding of ways to prevent future PD related complications and PD community resources.  Baseline: not yet initiated Goal status: PARTIALLY MET (issued first part)   4.  Pt will write a sentence with no significant decrease in size and maintain 75% legibility.  Baseline: 25% legibility Goal status: NOT FULLY MET  5. Patient will complete nine-hole peg with use of R in 35 seconds and with use of L in 40 seconds or less.  Baseline: R- 41 seconds; L- 46 seconds 05/27/2023: R 43; L - 43 Goal status: IN PROGRESS  ASSESSMENT:  CLINICAL IMPRESSION: Pt has HEP as needed to continue efforts to improve or at least maintain functional status secondary to PD. Unfortunately his progression towards goals has been limited by a lack of carryover of recommendations outside of therapy clinic. Recommend return in 3 months for OT, PT, and ST PD screens.  PERFORMANCE DEFICITS: in functional skills including ADLs, IADLs, coordination, sensation, strength, pain, Fine motor control, Gross motor control, mobility, balance, endurance, decreased knowledge of use of DME, and UE functional use, cognitive skills including attention, memory, problem solving, and safety awareness.  IMPAIRMENTS: are limiting patient from ADLs, IADLs, rest and sleep, leisure, and social participation.   COMORBIDITIES:  may have co-morbidities  that affects occupational performance. Patient will benefit from skilled OT to address above impairments and improve overall function.  REHAB POTENTIAL: Fair given PD prognosis   PLAN:  OT D/C Completed   Delana Meyer, OT 05/27/2023, 2:18 PM

## 2023-05-27 NOTE — Patient Instructions (Signed)
To DON jacket:    1) Wide stance - feet apart while sitting in wheelchair or edge of bed 2) hold facing tag out with hands big at sleeves 3) Swing around big like a cape to Rt side 4) Punch Rt arm in as you continue to pull behind you with Lt hand to Lt 5) Punch Lt arm in    To DOFF jacket:    1) Wide stance 2) Hold big hands at belly button level 3) Turn and twist to Rt to take off Rt shoulder 4) Turn and twist to Lt to take off Lt shoulder 5) Reach big behind you and grab cuff of sleeve BIG and yank off one hand, then the other

## 2023-05-29 ENCOUNTER — Other Ambulatory Visit: Payer: Self-pay | Admitting: Internal Medicine

## 2023-06-01 NOTE — Telephone Encounter (Signed)
Modafinil refilled.

## 2023-06-02 DIAGNOSIS — N200 Calculus of kidney: Secondary | ICD-10-CM | POA: Diagnosis not present

## 2023-06-08 ENCOUNTER — Other Ambulatory Visit: Payer: Self-pay | Admitting: Physical Medicine and Rehabilitation

## 2023-06-17 DIAGNOSIS — G47419 Narcolepsy without cataplexy: Secondary | ICD-10-CM | POA: Diagnosis not present

## 2023-06-17 DIAGNOSIS — D509 Iron deficiency anemia, unspecified: Secondary | ICD-10-CM | POA: Diagnosis not present

## 2023-06-17 DIAGNOSIS — G214 Vascular parkinsonism: Secondary | ICD-10-CM | POA: Diagnosis not present

## 2023-06-17 DIAGNOSIS — I5032 Chronic diastolic (congestive) heart failure: Secondary | ICD-10-CM | POA: Diagnosis not present

## 2023-06-17 DIAGNOSIS — Z950 Presence of cardiac pacemaker: Secondary | ICD-10-CM | POA: Diagnosis not present

## 2023-06-17 DIAGNOSIS — E7800 Pure hypercholesterolemia, unspecified: Secondary | ICD-10-CM | POA: Diagnosis not present

## 2023-06-17 DIAGNOSIS — I679 Cerebrovascular disease, unspecified: Secondary | ICD-10-CM | POA: Diagnosis not present

## 2023-06-17 DIAGNOSIS — E559 Vitamin D deficiency, unspecified: Secondary | ICD-10-CM | POA: Diagnosis not present

## 2023-06-17 DIAGNOSIS — E1140 Type 2 diabetes mellitus with diabetic neuropathy, unspecified: Secondary | ICD-10-CM | POA: Diagnosis not present

## 2023-06-17 DIAGNOSIS — G4730 Sleep apnea, unspecified: Secondary | ICD-10-CM | POA: Diagnosis not present

## 2023-06-17 DIAGNOSIS — E1169 Type 2 diabetes mellitus with other specified complication: Secondary | ICD-10-CM | POA: Diagnosis not present

## 2023-06-17 DIAGNOSIS — I10 Essential (primary) hypertension: Secondary | ICD-10-CM | POA: Diagnosis not present

## 2023-06-17 DIAGNOSIS — D3A8 Other benign neuroendocrine tumors: Secondary | ICD-10-CM | POA: Diagnosis not present

## 2023-06-17 DIAGNOSIS — I110 Hypertensive heart disease with heart failure: Secondary | ICD-10-CM | POA: Diagnosis not present

## 2023-06-24 IMAGING — MR MR LUMBAR SPINE W/O CM
4 of 5 series · 18 of 48 positions shown · non-contrast
Comparison: No prior MRI, correlation is made with CT abdomen
pelvis 10/29/2021.

CLINICAL DATA: Low back pain, chronic

EXAM:
MRI LUMBAR SPINE WITHOUT CONTRAST
TECHNIQUE: Multiplanar, multisequence MR imaging of the lumbar spine was
performed. No intravenous contrast was administered.

[Series 4: T2 · sagittal · 4.0mm · 0.55mm/px · 6 of 15 slices shown (1 of 2)]
[im 1/15]
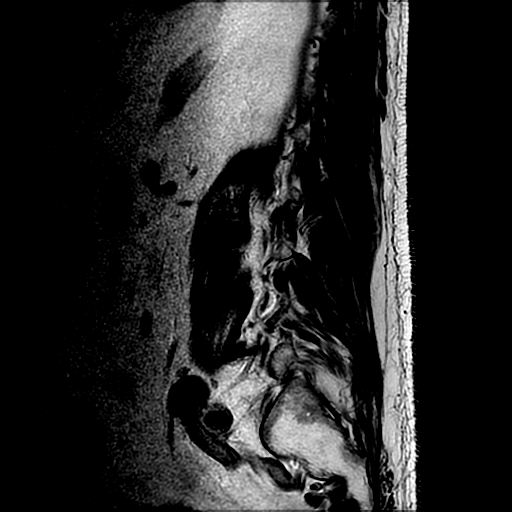
[im 3/15]
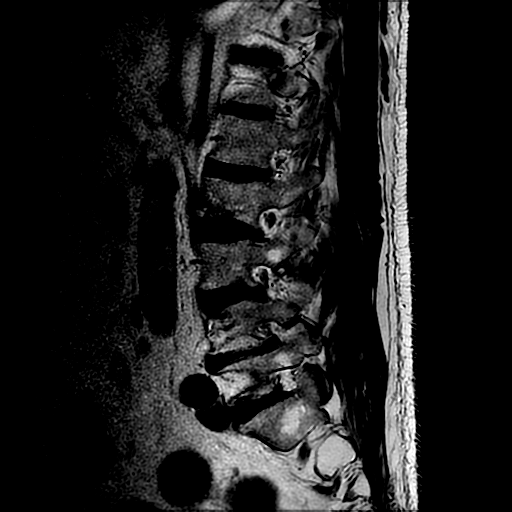
[im 6/15]
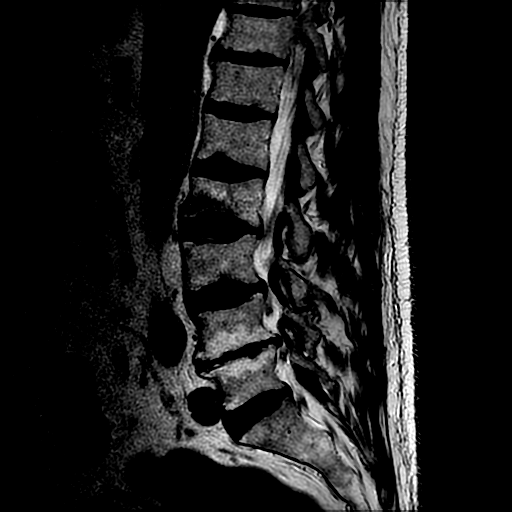
[im 9/15]
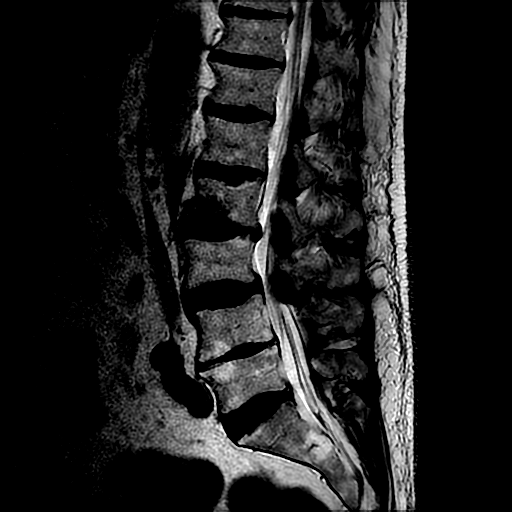
[im 12/15]
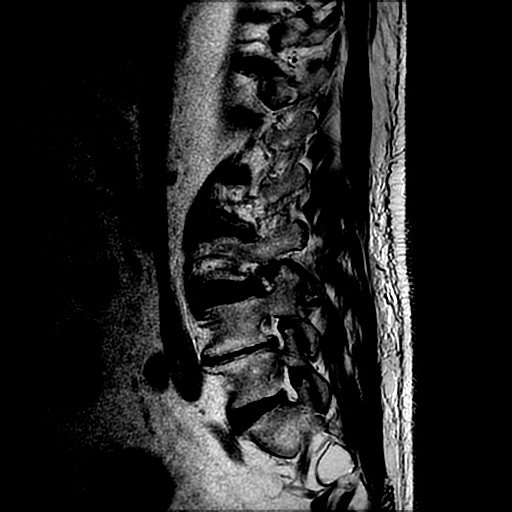
[im 15/15]
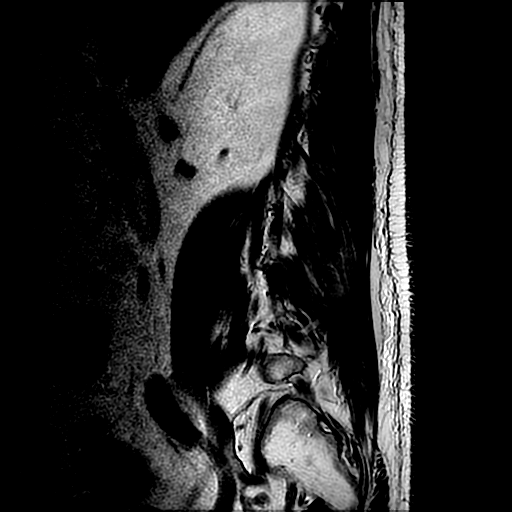

[Series 5: T1 · sagittal · 4.0mm · 0.55mm/px · 3 of 15 slices shown (1 of 2)]
[im 3/15]
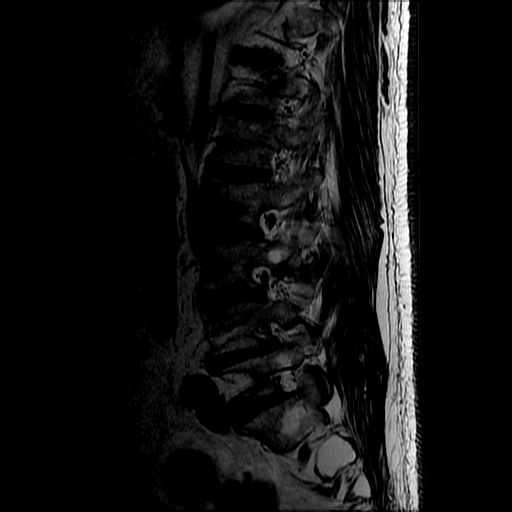
[im 9/15]
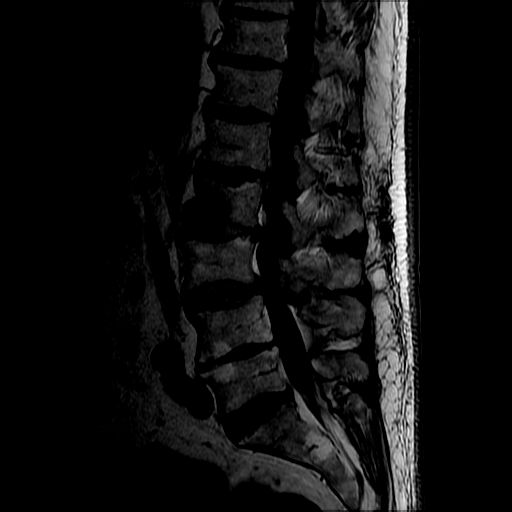
[im 15/15]
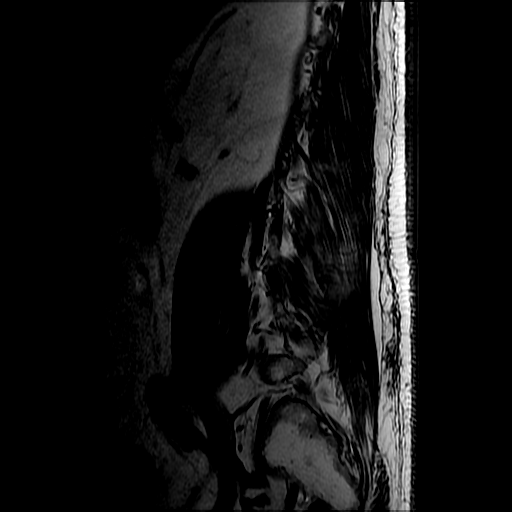

[Series 7: T2 · axial · 4.0mm · 0.39mm/px · z∈[+5,+187]mm · 6 of 38 slices shown (2 of 2)]
[im 1/38]
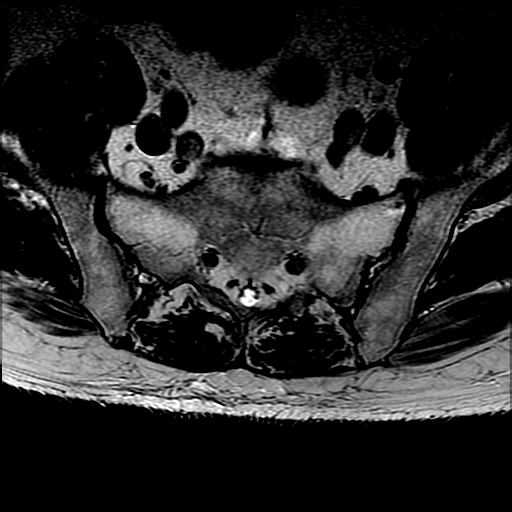
[im 6/38]
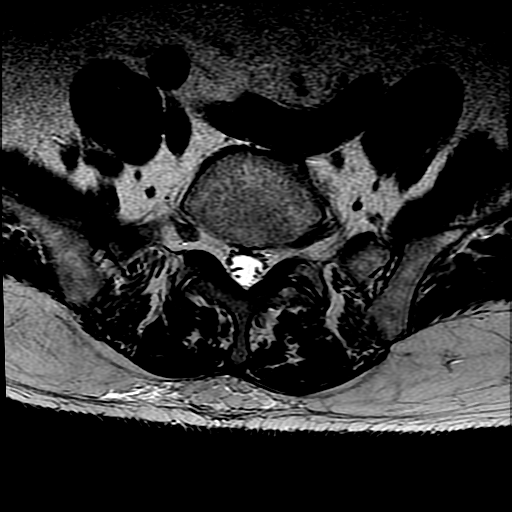
[im 11/38]
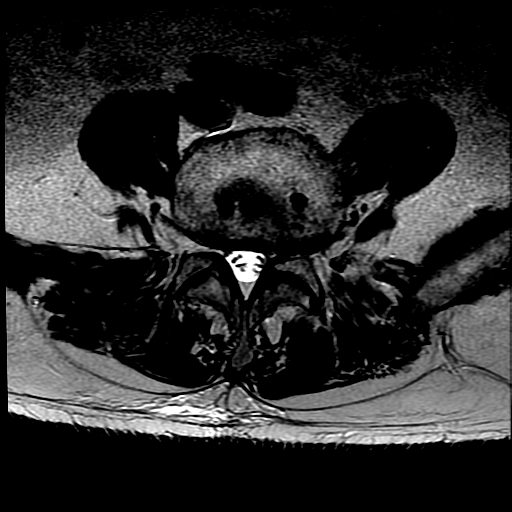
[im 16/38]
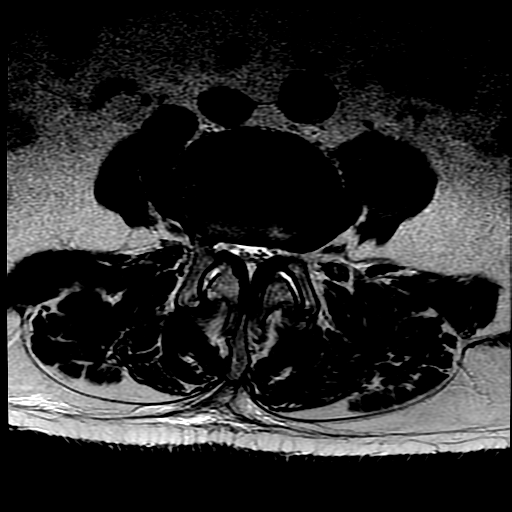
[im 19/38]
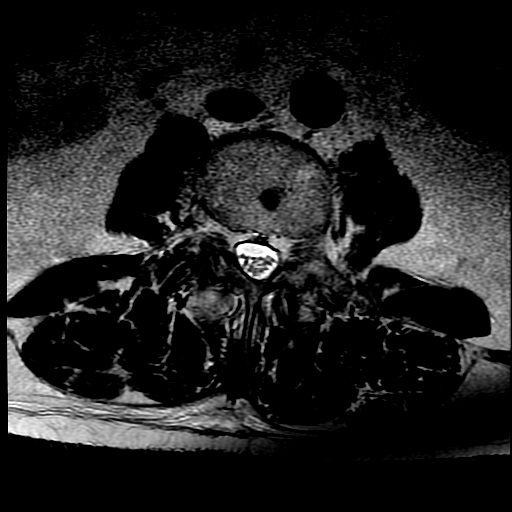
[im 32/38]
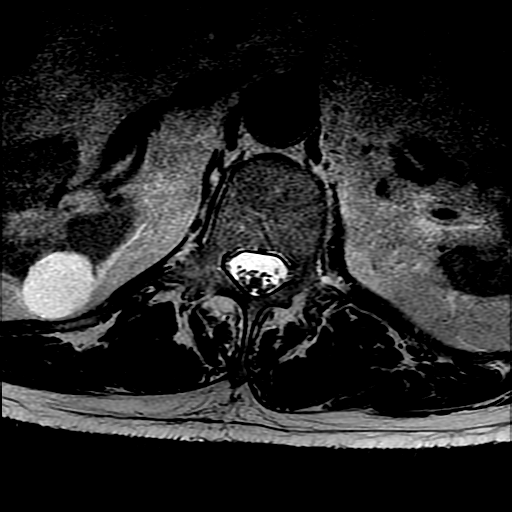

[Series 8: T1 · axial · 4.0mm · 0.39mm/px · z∈[+29,+187]mm · 3 of 38 slices shown (2 of 2)]
[im 6/38]
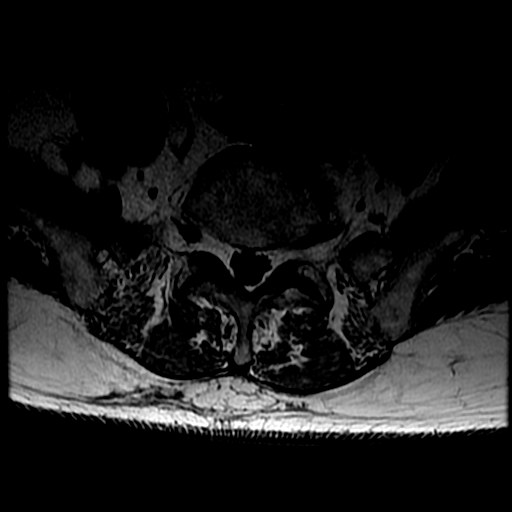
[im 19/38]
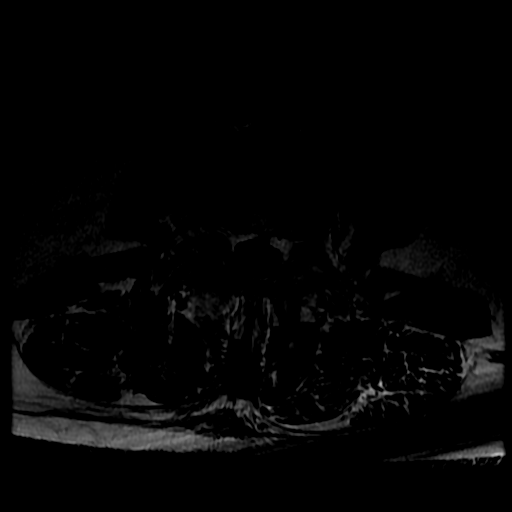
[im 32/38]
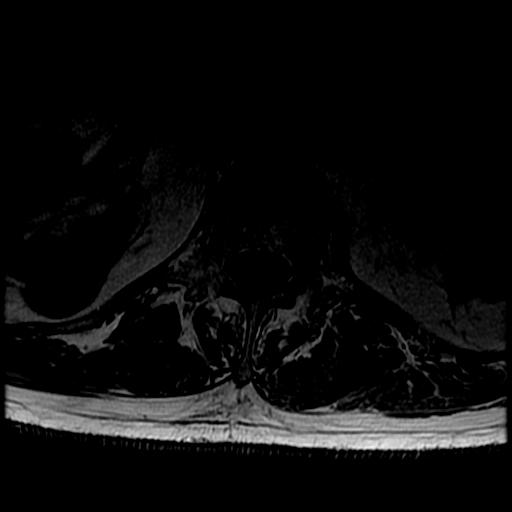

[18 of 48 positions shown; findings below may reference images not displayed]

FINDINGS: Segmentation: 5 lumbar-type vertebral bodies, with partial
sacralization of L5, with bilateral L5-S1 pseudoarticulation of
broadened L5 transverse processes with the sacral ala,
left-greater-than-right, which is likely better appreciated on the
coronal series from the 10/29/2021 CT abdomen pelvis.

Alignment: S shaped curvature of the thoracolumbar spine, with
levocurvature of the lumbar spine. No significant listhesis.

Vertebrae: Redemonstrated vertebral body height loss at L2, with
approximately 40% vertebral body height loss centrally; series
associated with mildly increased T2 signal on STIR sequences, likely
a subacute fracture. Vertebral body heights are otherwise preserved.
Increased T1 and T2 signal at the L4-L5 endplates,
left-greater-than-right, likely Modic type 2 endplate degenerative
changes.

Conus medullaris and cauda equina: Conus extends to the L1-L2 level.
Conus and cauda equina appear normal.

Paraspinal and other soft tissues: Renal cysts.

Disc levels:

T12-L1: Minimal disc bulge. No spinal canal stenosis or neural
foraminal narrowing.

L1-L2: Minimal disc bulge. No spinal canal stenosis or neural
foraminal narrowing.

L2-L3: Mild disc bulge. Narrowing of the right-greater-than-left
lateral recess. Mild facet arthropathy. No spinal canal stenosis or
neural foraminal narrowing.

L3-L4: Mild disc bulge. Moderate to severe facet arthropathy.
Ligamentum flavum hypertrophy. Moderate spinal canal stenosis.
Narrowing of the bilateral lateral recesses. Mild left and moderate
right neural foraminal narrowing.

L4-L5: Minimal disc bulge, somewhat eccentric to the left.
Mild-to-moderate facet arthropathy. Narrowing of the lateral
recesses. No spinal canal stenosis. Mild left neural foraminal
narrowing.

L5-S1: Mild disc bulge with central annular fissure. Mild facet
arthropathy. No spinal canal stenosis or neural foraminal narrowing.
IMPRESSION: 1. Subacute compression fracture of L2, with approximately 40%
vertebral body height loss centrally, as seen on the 10/29/2021 CT
abdomen pelvis.
2. L3-L4 moderate spinal canal stenosis with moderate right and mild
left neural foraminal narrowing. Narrowing of the
left-greater-than-right lateral recesses could affect the descending
L4 nerve roots.
3. L4-L5 mild left neural foraminal narrowing. Narrowing of the
left-greater-than-right recesses at this level could affect the
descending L5 nerve roots.
4. L2-L3 narrowing of the right-greater-than-left lateral recesses,
which could affect the descending L3 nerve roots.

## 2023-07-08 DIAGNOSIS — I739 Peripheral vascular disease, unspecified: Secondary | ICD-10-CM | POA: Diagnosis not present

## 2023-07-08 DIAGNOSIS — L603 Nail dystrophy: Secondary | ICD-10-CM | POA: Diagnosis not present

## 2023-07-08 DIAGNOSIS — E1151 Type 2 diabetes mellitus with diabetic peripheral angiopathy without gangrene: Secondary | ICD-10-CM | POA: Diagnosis not present

## 2023-07-08 DIAGNOSIS — E1142 Type 2 diabetes mellitus with diabetic polyneuropathy: Secondary | ICD-10-CM | POA: Diagnosis not present

## 2023-07-20 ENCOUNTER — Other Ambulatory Visit: Payer: Self-pay

## 2023-07-20 DIAGNOSIS — C7B8 Other secondary neuroendocrine tumors: Secondary | ICD-10-CM

## 2023-07-20 DIAGNOSIS — K769 Liver disease, unspecified: Secondary | ICD-10-CM

## 2023-07-22 ENCOUNTER — Encounter (HOSPITAL_COMMUNITY): Payer: Medicare (Managed Care)

## 2023-07-27 DIAGNOSIS — M5136 Other intervertebral disc degeneration, lumbar region: Secondary | ICD-10-CM | POA: Diagnosis not present

## 2023-07-27 DIAGNOSIS — M9903 Segmental and somatic dysfunction of lumbar region: Secondary | ICD-10-CM | POA: Diagnosis not present

## 2023-07-27 DIAGNOSIS — M9905 Segmental and somatic dysfunction of pelvic region: Secondary | ICD-10-CM | POA: Diagnosis not present

## 2023-07-27 DIAGNOSIS — M9904 Segmental and somatic dysfunction of sacral region: Secondary | ICD-10-CM | POA: Diagnosis not present

## 2023-07-28 ENCOUNTER — Ambulatory Visit (INDEPENDENT_AMBULATORY_CARE_PROVIDER_SITE_OTHER): Payer: Medicare (Managed Care)

## 2023-07-28 DIAGNOSIS — I495 Sick sinus syndrome: Secondary | ICD-10-CM

## 2023-07-29 LAB — CUP PACEART REMOTE DEVICE CHECK
Date Time Interrogation Session: 20240926074704
Implantable Lead Connection Status: 753985
Implantable Lead Connection Status: 753985
Implantable Lead Implant Date: 20170525
Implantable Lead Implant Date: 20170525
Implantable Lead Location: 753859
Implantable Lead Location: 753860
Implantable Lead Model: 377
Implantable Lead Model: 377
Implantable Lead Serial Number: 49470224
Implantable Lead Serial Number: 49485613
Implantable Pulse Generator Implant Date: 20170525
Pulse Gen Model: 394969
Pulse Gen Serial Number: 68798589

## 2023-08-03 DIAGNOSIS — G4733 Obstructive sleep apnea (adult) (pediatric): Secondary | ICD-10-CM | POA: Diagnosis not present

## 2023-08-05 ENCOUNTER — Ambulatory Visit (HOSPITAL_COMMUNITY)
Admission: RE | Admit: 2023-08-05 | Discharge: 2023-08-05 | Disposition: A | Discharge status: Home / Self Care | Payer: Medicare (Managed Care) | Source: Ambulatory Visit | Attending: Hematology | Admitting: Hematology

## 2023-08-05 ENCOUNTER — Inpatient Hospital Stay: Payer: Medicare (Managed Care) | Attending: Hematology

## 2023-08-05 ENCOUNTER — Encounter (HOSPITAL_COMMUNITY): Payer: Self-pay

## 2023-08-05 DIAGNOSIS — C7A8 Other malignant neuroendocrine tumors: Secondary | ICD-10-CM | POA: Diagnosis not present

## 2023-08-05 DIAGNOSIS — C7B8 Other secondary neuroendocrine tumors: Secondary | ICD-10-CM | POA: Insufficient documentation

## 2023-08-05 DIAGNOSIS — N281 Cyst of kidney, acquired: Secondary | ICD-10-CM | POA: Diagnosis not present

## 2023-08-05 DIAGNOSIS — R932 Abnormal findings on diagnostic imaging of liver and biliary tract: Secondary | ICD-10-CM | POA: Diagnosis not present

## 2023-08-05 DIAGNOSIS — N132 Hydronephrosis with renal and ureteral calculous obstruction: Secondary | ICD-10-CM | POA: Diagnosis not present

## 2023-08-05 DIAGNOSIS — Z79899 Other long term (current) drug therapy: Secondary | ICD-10-CM | POA: Insufficient documentation

## 2023-08-05 DIAGNOSIS — K769 Liver disease, unspecified: Secondary | ICD-10-CM | POA: Insufficient documentation

## 2023-08-05 LAB — COMPREHENSIVE METABOLIC PANEL
ALT: <5 U/L (ref 0–44)
AST: 17 U/L (ref 15–41)
Albumin: 3.8 g/dL (ref 3.5–5.0)
Alkaline Phosphatase: 75 U/L (ref 38–126)
Anion gap: 10 (ref 5–15)
BUN: 23 mg/dL (ref 8–23)
CO2: 25 mmol/L (ref 22–32)
Calcium: 9.8 mg/dL (ref 8.9–10.3)
Chloride: 104 mmol/L (ref 98–111)
Creatinine, Ser: 1.05 mg/dL (ref 0.61–1.24)
GFR, Estimated: >60 mL/min (ref >60)
Glucose, Bld: 171 mg/dL — ABNORMAL HIGH (ref 70–99)
Potassium: 4.1 mmol/L (ref 3.5–5.1)
Sodium: 139 mmol/L (ref 135–145)
Total Bilirubin: 0.4 mg/dL (ref 0.3–1.2)
Total Protein: 7.4 g/dL (ref 6.5–8.1)

## 2023-08-05 LAB — CBC WITH DIFFERENTIAL/PLATELET
Abs Immature Granulocytes: 0.04 10*3/uL (ref 0.00–0.07)
Basophils Absolute: 0.1 10*3/uL (ref 0.0–0.1)
Basophils Relative: 1 %
Eosinophils Absolute: 0.3 10*3/uL (ref 0.0–0.5)
Eosinophils Relative: 3 %
HCT: 37.7 % — ABNORMAL LOW (ref 39.0–52.0)
Hemoglobin: 12.1 g/dL — ABNORMAL LOW (ref 13.0–17.0)
Immature Granulocytes: 0 %
Lymphocytes Relative: 30 %
Lymphs Abs: 2.8 10*3/uL (ref 0.7–4.0)
MCH: 28 pg (ref 26.0–34.0)
MCHC: 32.1 g/dL (ref 30.0–36.0)
MCV: 87.3 fL (ref 80.0–100.0)
Monocytes Absolute: 0.8 10*3/uL (ref 0.1–1.0)
Monocytes Relative: 9 %
Neutro Abs: 5.3 10*3/uL (ref 1.7–7.7)
Neutrophils Relative %: 57 %
Platelets: 309 10*3/uL (ref 150–400)
RBC: 4.32 MIL/uL (ref 4.22–5.81)
RDW: 14.2 % (ref 11.5–15.5)
WBC: 9.3 10*3/uL (ref 4.0–10.5)
nRBC: 0 % (ref 0.0–0.2)

## 2023-08-05 MED ORDER — SODIUM CHLORIDE (PF) 0.9 % IJ SOLN
INTRAMUSCULAR | Status: AC
  Filled 2023-08-05: qty 50

## 2023-08-05 MED ORDER — IOHEXOL 300 MG/ML  SOLN
100.0000 mL | Freq: Once | INTRAMUSCULAR | Status: AC | PRN
  Administered 2023-08-05: 100 mL via INTRAVENOUS

## 2023-08-09 LAB — CHROMOGRANIN A: Chromogranin A (ng/mL): 103.7 ng/mL — ABNORMAL HIGH (ref 0.0–101.8)

## 2023-08-10 NOTE — Progress Notes (Signed)
Remote pacemaker transmission.   

## 2023-08-11 ENCOUNTER — Inpatient Hospital Stay (HOSPITAL_BASED_OUTPATIENT_CLINIC_OR_DEPARTMENT_OTHER): Payer: Medicare (Managed Care) | Admitting: Hematology

## 2023-08-11 VITALS — BP 115/68 | HR 73 | Temp 98.0°F | Resp 15 | Ht 68.0 in | Wt 212.7 lb

## 2023-08-11 DIAGNOSIS — C7B8 Other secondary neuroendocrine tumors: Secondary | ICD-10-CM

## 2023-08-11 MED ORDER — RIVAROXABAN 15 MG PO TABS: 15.0000 mg | ORAL_TABLET | Freq: Two times a day (BID) | ORAL | 1 refills | Status: DC

## 2023-08-11 MED ORDER — RIVAROXABAN 15 MG PO TABS: 15.0000 mg | ORAL_TABLET | Freq: Every day | ORAL | 1 refills | Status: DC

## 2023-08-11 NOTE — Assessment & Plan Note (Signed)
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 CT AP on 06/17/22 showed: calcified mesenteric mass with suspected associated small bowel mass; suspicious right hepatic lesion; nodular enhancement along wall of bowel loop in central abdomen.  -abdomen MRI 06/23/22 showed: 3.9 cm mass in right hepatic lobe; small RLQ mesenteric mass or lymphadenopathy. 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 -PET DOTATATE from 08/12/22 showed avid disease within the liver, mesentery and small bowel. 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 He tolerated well with no noticeable side effects.

## 2023-08-11 NOTE — Progress Notes (Signed)
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 CT AP on 06/17/22 showed: calcified mesenteric mass with suspected associated small bowel mass; suspicious right hepatic lesion; nodular enhancement along wall of bowel loop in central abdomen.  -abdomen MRI 06/23/22 showed: 3.9 cm mass in right hepatic lobe; small RLQ mesenteric mass or lymphadenopathy. 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 -PET DOTATATE from 08/12/22 showed avid disease within the liver, mesentery and small bowel. 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 He tolerated well with no noticeable side effects.      Assessment and Plan    Metastatic Neuroendocrine Tumor Stable disease on recent CT scan. No bowel obstruction or new lesions. Mildly elevated tumor markers. No current symptoms from disease. -Continue monitoring clinically with follow-up in 4 months. -Repeat CT scan in 1 year if clinically stable. -Consider treatment if symptoms develop or tumor markers rise rapidly.  Hematuria Likely secondary to kidney stones and Xarelto use. Hemoglobin slightly low at 12.1, but not requiring treatment. Variable blood in urine, occurring almost daily. -Continue monitoring hemoglobin and hematocrit to ensure no development of anemia. -Consider low dose iron supplement due to blood loss.  Kidney Stones Multiple stones present, largest 1.1cm in left renal pelvis. Possible cause of groin pain. -Continue  monitoring.  DVT On Xarelto 15mg  daily. Chronic hematuria likely contributing to slightly low hemoglobin. -Refill Xarelto prescription. -If significant bleeding occurs, consider holding Xarelto for a few days.    Plan -Lab and CT scan reviewed, stable disease.  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   EXAM: CT ABDOMEN AND PELVIS WITH CONTRAST  IMPRESSION: 1. 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 2. Hepatic lesion as discussed above in the posterior RIGHT hemiliver is suspicious for metastasis in this context. For all above findings in lieu of hepatic MRI, if the patient is not able to undergo hepatic MRI, biochemical correlation and DOTATATE PET may be helpful. 3. Bowel loop in the central abdomen with nodular enhancement approximately 8-9 mm along the wall in terms of greatest wall thickness. 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 4. Hepatic steatosis. 5. Resolution of inflammation about the pancreatic tail with nodular appearance of pancreatic tail. Attention on subsequent imaging is suggested. This may reflect sequela of prior inflammation given improvement but underlying lesion in this location while not  favored is not  excluded.  ADDENDUM: For clarification, the central, ileal mesenteric lesion currently measuring 2.1 x 2.1 x 2.4 cm measured 1.6 x 1.8 x 2.1 cm in 2015.   Faintly visible area in the posterior RIGHT hemiliver in 2015 raising the question of possibility of small lesion at best 2 x 2.2 cm in the posterior RIGHT hemiliver this areas now clearly visible measuring 4.1 x 4.0 cm. Findings are compatible with indolent behavior. Indolent carcinoid remains the primary differential consideration. Tumor marker correlation is suggested with DOTATATE PET for further evaluation as warranted.   06/23/2022 Imaging   EXAM: MRI ABDOMEN WITHOUT AND WITH CONTRAST  IMPRESSION: 3.9 cm heterogeneous enhancing mass in segment 7 of the right hepatic lobe, which is nonspecific. Hepatic metastasis cannot be excluded.   Small benign hemangioma and cysts in the posterior right hepatic lobe.   Small right lower quadrant mesenteric mass or lymphadenopathy, without significant change.   Stable 2.1 cm cystic lesion in the pancreatic neck, with other tiny sub-cm cystic foci in the pancreatic head and body. Differential diagnosis includes pancreatic pseudocysts and indolent IPMNs. Recommend continued follow-up by MRI in 6 months. This recommendation follows ACR consensus guidelines: Management of Incidental Pancreatic Cysts: A White Paper of the ACR Incidental Findings Committee. J Am Coll Radiol 2017;14:911-923.   7 mm calculus in the left renal pelvis, with mild left pelvicaliectasis.   07/14/2022 Initial Biopsy   FINAL MICROSCOPIC DIAGNOSIS:   A. LIVER, RIGHT, NEEDLE CORE BIOPSY:  -  Metastatic low-grade neuroendocrine tumor (positive for chromogranin and synaptophysin).  -  Proliferation rate by Ki-67 less than 3%.    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  History of Present Illness   The patient, an 82 year old with a history of metastatic neuroendocrine tumor, Parkinsonism, and kidney stones, presents for a follow-up visit. The patient has been experiencing hematuria, which is believed to be due to the kidney stones and the use of Xarelto. The patient also has constipation, which could be due to the Parkinsonism. The patient is on Xarelto due to a history of blood clots.         All other systems were reviewed with the patient and are negative.  MEDICAL HISTORY:  Past Medical History:  Diagnosis Date   Arthritis    R shoulder, bone spur   Arthritis    "hands" (03/25/2016)   Chronic diastolic heart failure (HCC)    Chronic lower back pain    Coronary heart disease    Dr Garnette Scheuermann   Diverticulitis 12/2021   DVT (deep venous thrombosis) (HCC) 05/2022   left leg   H/O cardiovascular stress test    perhaps last one was 2009   Heart murmur    12/29/11 echo: mild MR, no AS, trivial TR   History of gout    Hyperlipidemia    Hypertension    saw Dr. Mendel Ryder last earl;y- 2014, cardiac cath. last done ?2009, blocks seen didn't require any intervention at that point.   Narcolepsy    [name] 04-11-97; Mean Latency 1.45min, SOREM 2   OSA on CPAP    NPSG 12-13-98 AHI 22.7  PONV (postoperative nausea and vomiting)    Presence of permanent cardiac pacemaker    Shortness of breath    with exertion    Stroke (HCC)    Type II diabetes mellitus (HCC)     SURGICAL HISTORY: Past Surgical History:  Procedure Laterality Date   CARDIAC CATHETERIZATION  2009?   EP IMPLANTABLE DEVICE N/A 03/25/2016   Procedure: Pacemaker Implant - Dual Chamber;  Surgeon: Marinus Maw, MD;  Location: [facility] INVASIVE CV LAB;  Service: Cardiovascular;  Laterality: N/A;   FRACTURE SURGERY      HIP FRACTURE SURGERY     INSERT / REPLACE / REMOVE PACEMAKER  03/24/2016   INTRAMEDULLARY (IM) NAIL INTERTROCHANTERIC Left 02/08/2022   Procedure: INTRAMEDULLARY (IM) NAIL INTERTROCHANTRIC;  Surgeon: Cammy Copa, MD;  Location: [facility] OR;  Service: Orthopedics;  Laterality: Left;   IR KYPHO LUMBAR INC FX REDUCE BONE BX UNI/BIL CANNULATION INC/IMAGING  12/29/2021   IR RADIOLOGIST EVAL & MGMT  12/15/2021   IR RADIOLOGY PERIPHERAL GUIDED IV START  07/14/2022   IR US GUIDE VASC ACCESS RIGHT  07/15/2022   LEFT AND RIGHT HEART CATHETERIZATION WITH CORONARY ANGIOGRAM N/A 06/05/2014   Procedure: LEFT AND RIGHT HEART CATHETERIZATION WITH CORONARY ANGIOGRAM;  Surgeon: Lesleigh Noe, MD;  Location: [facility] CATH LAB;  Service: Cardiovascular;  Laterality: N/A;   NASAL FRACTURE SURGERY     SHOULDER ARTHROSCOPY WITH ROTATOR CUFF REPAIR AND SUBACROMIAL DECOMPRESSION Right 08/16/2013   Procedure: SHOULDER ARTHROSCOPY WITH ROTATOR CUFF REPAIR AND SUBACROMIAL DECOMPRESSION;  Surgeon: Mable Paris, MD;  Location: [facility] OR;  Service: Orthopedics;  Laterality: Right;  Right shoulder arthroscopy rotator cuff repair, subacromial decompression.   TONSILLECTOMY      I have reviewed the social history and family history with the patient and they are unchanged from previous note.  ALLERGIES:  is allergic to amantadines, clonazepam, and zoloft [sertraline].  MEDICATIONS:  Current Outpatient Medications  Medication Sig Dispense Refill   acetaminophen (TYLENOL) 500 MG tablet Take 500-1,000 mg by mouth every 6 (six) hours as needed for moderate pain or mild pain.     atorvastatin (LIPITOR) 40 MG tablet Take 0.5 tablets (20 mg total) by mouth daily. 30 tablet 0   bifidobacterium infantis (ALIGN) capsule Take 1 capsule by mouth daily.     Camphor-Menthol-Methyl Sal (HM SALONPAS PAIN RELIEF EX) Apply 1 patch topically daily as needed (Back pain).     carbidopa-levodopa (SINEMET CR) 50-200 MG tablet TAKE 1 TABLET  BY MOUTH EVERYDAY AT BEDTIME 90 tablet 1   carbidopa-levodopa (SINEMET IR) 25-100 MG tablet TAKE 1 AND HALF TABLETS 3 TIMES A DAY SPACED OUT BY 5 HOURS 405 tablet 0   cholecalciferol (VITAMIN D3) 25 MCG (1000 UNIT) tablet Take 1,000 Units by mouth daily.     clotrimazole-betamethasone (LOTRISONE) cream Apply 1 Application topically daily as needed (Rash).     docusate sodium (COLACE) 100 MG capsule Take 1 capsule (100 mg total) by mouth 2 (two) times daily. (Patient taking differently: Take 100 mg by mouth daily as needed for mild constipation or moderate constipation. As Needed) 10 capsule 0   feeding supplement, GLUCERNA SHAKE, (GLUCERNA SHAKE) LIQD Take 237 mLs by mouth 2 (two) times daily between meals. Alternate with ensure Diabetic     furosemide (LASIX) 20 MG tablet USE AS NEEDED FOR UP TO 5 DAYS IF SWELLING IN LEGS RECURS. CONTACT CARDIOLOGY IS SWELLING DOES NOT IMPROVE OR GETS WORSE. 15 tablet 0   lidocaine (LIDODERM) 5 % Place  1 patch onto the skin daily as needed. Purchase over the counter. On for 12 hours and off for 12 hours 30 patch 0   magnesium gluconate (MAGONATE) 500 MG tablet Take 0.5 tablets (250 mg total) by mouth at bedtime. 30 tablet 0   Melatonin 10 MG TABS Take 10 mg by mouth at bedtime.     metFORMIN (GLUCOPHAGE) 1000 MG tablet Take 1 tablet (1,000 mg total) by mouth daily with breakfast. (Patient taking differently: Take 2,000 mg by mouth 2 (two) times daily.) 30 tablet 0   metoprolol tartrate (LOPRESSOR) 25 MG tablet Take 1 tablet (25 mg total) by mouth 2 (two) times daily. (Patient taking differently: Take 12.5 mg by mouth 2 (two) times daily.) 60 tablet 0   modafinil (PROVIGIL) 200 MG tablet TAKE 1 TABLET BY MOUTH EVERY DAY 30 tablet 5   Multiple Vitamin (MULTIVITAMIN WITH MINERALS) TABS tablet Take 1 tablet by mouth daily.     NITROSTAT 0.4 MG SL tablet PLACE 1 TABLET (0.4 MG TOTAL) UNDER THE TONGUE EVERY 5 (FIVE) MINUTES AS NEEDED FOR CHEST PAIN. (Patient taking  differently: Place 0.4 mg under the tongue every 5 (five) minutes as needed for chest pain.) 25 tablet 5   Omega-3 Fatty Acids (OMEGA 3 PO) Take 1,280 mg by mouth daily.     polyethylene glycol (MIRALAX / GLYCOLAX) 17 g packet Take 17 g by mouth daily. 14 each 0   polyvinyl alcohol (LIQUIFILM TEARS) 1.4 % ophthalmic solution Place 1 drop into both eyes as needed for dry eyes. (Patient taking differently: Place 1 drop into both eyes daily as needed for dry eyes.) 15 mL 0   QUEtiapine (SEROQUEL) 25 MG tablet TAKE 1/2 TABLETS (12.5 MG TOTAL) BY MOUTH AT BEDTIME. 45 tablet 1   Rivaroxaban (XARELTO) 15 MG TABS tablet Take 1 tablet (15 mg total) by mouth daily. 90 tablet 1   sitaGLIPtin (JANUVIA) 50 MG tablet Take 50 mg by mouth daily.     Vitamin D, Ergocalciferol, (DRISDOL) 1.25 MG (50000 UNIT) CAPS capsule TAKE 1 CAPSULE (50,000 UNITS TOTAL) BY MOUTH EVERY 7 (SEVEN) DAYS 5 capsule 0   No current facility-administered medications for this visit.    PHYSICAL EXAMINATION: ECOG PERFORMANCE STATUS: 2 - Symptomatic, <50% confined to bed  Vitals:   08/11/23 1154  BP: 115/68  Pulse: 73  Resp: 15  Temp: 98 F (36.7 C)  SpO2: 98%   Wt Readings from Last 3 Encounters:  08/11/23 212 lb 11.2 oz (96.5 kg)  04/12/23 211 lb (95.7 kg)  03/11/23 213 lb 12.8 oz (97 kg)     GENERAL:alert, no distress and comfortable SKIN: skin color, texture, turgor are normal, no rashes or significant lesions EYES: normal, Conjunctiva are pink and non-injected, sclera clear NECK: supple, thyroid normal size, non-tender, without nodularity LYMPH:  no palpable lymphadenopathy in the cervical, axillary  LUNGS: clear to auscultation and percussion with normal breathing effort HEART: regular rate & rhythm and no murmurs and no lower extremity edema ABDOMEN:abdomen soft, non-tender and normal bowel sounds Musculoskeletal:no cyanosis of digits and no clubbing  NEURO: alert & oriented x 3 with fluent speech, no focal  motor/sensory deficits    LABORATORY DATA:  I have reviewed the data as listed    Latest Ref Rng & Units 08/05/2023   12:01 PM 03/11/2023   11:58 AM 12/17/2022   12:52 PM  CBC  WBC 4.0 - 10.5 K/uL 9.3  8.8  10.7   Hemoglobin 13.0 - 17.0 g/dL 12.1  12.8  13.0   Hematocrit 39.0 - 52.0 % 37.7  38.5  39.0   Platelets 150 - 400 K/uL 309  254  281         Latest Ref Rng & Units 08/05/2023   12:01 PM 03/11/2023   11:58 AM 12/17/2022   12:52 PM  CMP  Glucose 70 - 99 mg/dL 161  096  045   BUN 8 - 23 mg/dL 23  16  24    Creatinine 0.61 - 1.24 mg/dL 4.09  8.11  9.14   Sodium 135 - 145 mmol/L 139  141  138   Potassium 3.5 - 5.1 mmol/L 4.1  3.8  4.3   Chloride 98 - 111 mmol/L 104  105  103   CO2 22 - 32 mmol/L 25  27  23    Calcium 8.9 - 10.3 mg/dL 9.8  9.4  9.2   Total Protein 6.5 - 8.1 g/dL 7.4  7.2  7.0   Total Bilirubin 0.3 - 1.2 mg/dL 0.4  0.4  0.5   Alkaline Phos 38 - 126 U/L 75  70  79   AST 15 - 41 U/L 17  19  14    ALT 0 - 44 U/L 5  5  8        RADIOGRAPHIC STUDIES: I have personally reviewed the radiological images as listed and agreed with the findings in the report. No results found.    No orders of the defined types were placed in this encounter.  All questions were answered. The patient knows to call the clinic with any problems, questions or concerns. No barriers to learning was detected. The total time spent in the appointment was 25 minutes.     Malachy Mood, MD 08/11/2023

## 2023-08-17 ENCOUNTER — Encounter: Payer: Self-pay | Admitting: Adult Health

## 2023-08-17 ENCOUNTER — Ambulatory Visit (INDEPENDENT_AMBULATORY_CARE_PROVIDER_SITE_OTHER): Payer: Medicare (Managed Care) | Admitting: Adult Health

## 2023-08-17 VITALS — BP 100/65 | HR 73 | Ht 69.0 in | Wt 212.0 lb

## 2023-08-17 DIAGNOSIS — G214 Vascular parkinsonism: Secondary | ICD-10-CM | POA: Diagnosis not present

## 2023-08-17 MED ORDER — CARBIDOPA-LEVODOPA 25-100 MG PO TABS: 1.5000 | ORAL_TABLET | Freq: Three times a day (TID) | ORAL | 3 refills | Status: DC

## 2023-08-17 MED ORDER — CARBIDOPA-LEVODOPA ER 50-200 MG PO TBCR: 1.0000 | EXTENDED_RELEASE_TABLET | Freq: Every day | ORAL | 3 refills | Status: DC

## 2023-08-17 NOTE — Progress Notes (Signed)
PATIENT: Jordan [name] DOB: [birth year]  REASON FOR VISIT: vascular parkinsonism f/u HISTORY FROM: patient and wife PRIMARY NEUROLOGIST: Dr. 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 Pt's states there's weakness on legs and arms when walking, states he uses his walker to move around. Pt's wife states pt is having an issue with constipation as well.      HISTORY OF PRESENT ILLNESS:  Update 08/17/2023 JM: Patient returns for 27-month follow-up accompanied by his wife. Overall stable since prior visit. Denies any progression of symptoms. Continues on Sinemet 1.5 tabs TID (first dose around 9:30-10am and then every 4-5 hours) and Sinemet CR 1 tab at bedtime, denies side effects. Will have worsening symptoms if he goes past that 4-5 hour mark. Gait is shuffled, ambulates with RW short distance, w/c long distance, no recent falls. Completed therapies back in July, recommended 3 mo f/u for PD re-evaluation but unable to be scheduled for return visit until end of Jan. Does have issues with constipation, will use colax and/or miralax as needed, does drink some fluids during the day but does not drink straight water, reports this gives him heart burn. Has difficulty swallowing large pills, takes with applesauce, avoids chewier foods more due to dental reasons, denies any worsening of swallowing.  No further questions or concerns at this time.    History provided for reference purposes only Update 02/15/2023 MM: Jordan [name] is a 82 y.o. male who has been followed in this office for vascular parkinsons. Returns today for follow-up. Here today with his wife. Reports that this AM he felt weak in the lower legs. Does not drink much water. Issues with constipation- doesn't want to take a stool softner daily d/t fear of having an accident. Some trouble swallowing but not consistent. Some trouble with  pills but now uses applesauce and that works better. Uses a walker at home and wheelchair when out and about.  Reports that he was having vivid dreams but Seroquel has helped with that.  He typically goes to bed around 11 PM and wakes up around 11 AM.  His first dose of Sinemet is around 10:30 AM, 230 and 630.  He takes Sinemet CR around 9 PM.  Wife knows that he has some trouble with leg weakness in the late afternoons.  She is wondering if Sinemet IR should be increased  Update 11/23/2022 Dr. Pearlean Brownie: He returns for follow-up after last visit 3 months ago.  He is accompanied by his wife.  They have noticed slight improvement after increasing the Sinemet dose to 25/100 to 1 and half tablets 3 times daily.  He has noticed improvement in his mobility and does not feel as weak or fatigued.  He denies any increased side effects in the form of nausea, dizziness but does have mild sleepiness.  He denies any delusions or hallucinations.  Still needs help with many activities of daily living but he can feed himself and clean himself In the toilet.  He complains of fatigability and he does use his CPAP regularly but has a appointment coming up to see Dr. Maple Hudson denies any recurrent symptoms of stroke TIA.  His blood pressure tends to be on the low side and he denies he is short distances.  He does agree to some slight increase in drooling particularly in the morning.  He remains on Xarelto  for his DVT and is tolerating it well without bruising or bleeding.  Sleep well most of the night.  Update 08/19/22 Dr. Pearlean Brownie: He returns for follow-up after last visit with Shanda Bumps nurse practitioner 1 month ago.  He is accompanied by his wife.  She states that for the last week or so she has noticed worsening of his parkinsonian symptoms.  He appears extremely fatigued and weak.  He has trouble walking down the hallway from the bedroom to the living room and has to stop.  At times he has difficult time getting started with any walks.   Walks with increased shuffling and small steps.  He has mild intermittent tremor but it is not as bothersome.  Patient had been on amantadine 200 mg daily but he was having some hallucinations so this was discontinued a week or 2 ago and may have contributed to his worsening.  He is currently on Sinemet IR 25/101 tablet 3 times daily and Sinemet CR 25/101 tablet at bedtime.  He seems to be tolerating these medications fairly well without significant dizziness, sleepiness or other side effects.  He has had no recent stroke or TIA symptoms.  He remains on Xarelto for DVT treatment.  Update 07/13/2022 JM: Patient returns for follow up visit accompanied by his wife   He has been relatively stable from neurological standpoint over the past few months.  He has remained on Sinemet 1 tab 3 times daily and Sinemet CR 1 tab nightly. Tolerating without side effects. Does have some worsening stiffness and slowness pursing in the morning and in the evening/bedtime. Can have occasional difficulty with right leg hesitancy/coordination especially in the evening. Use of RW for short distance ambulation otherwise use of w/c.  Recently completed outpatient therapies.  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  He does try to maintain some ADLs independently but does need some assistance by his wife.  He does fatigue quickly after some ADLs such as bathing or dressing.  Previously having issues with visual hallucinations but these have greatly subsided since starting Seroquel 12.5 mg nightly, does still have some occasional visual hallucinations but nonthreatening.  Does have frequent talking and acting out (punching and kicking) while sleeping.  He is on melatonin 10 mg nightly.  Admits to limited daytime physical activity, he usually sits and watches TV during the day, can fall asleep easily in chair unless watching something that keeps his interest. 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 Maple Hudson back in March.  He does endorse nightly use of CPAP.   He has had multiple hospitalizations since prior visit   Hospitalized 12/2021 for frequent falls and hallucinations, started on Seroquel at that time per psychiatry recommendations and felt hallucinations in setting of vascular parkinsonism.  He was discharged to SNF for deconditioning.   He underwent kyphoplasty on 2/28 for lumbar compression fracture by Dr. Corliss Skains   hospitalized again 3/1 for generalized abdominal pain and frequent falls, discharged to CIR on 3/10. During CIR stay, he was started on amantadine to help with activation and remained on low-dose Seroquel to help with sleep and decrease nighttime hallucinations.   Suffered a closed left hip fracture post fall 01/2022 and underwent surgical repair.  He was discharged to SNF rehab and returned home on 6/8.   Diagnosed with left common femoral vein, SF junction, femoral vein, proximal profunda vein, popliteal vein and posterior  tibial vein DVT on 6/28. Placed on Xarelto with outpatient vascular surgery follow-up. Had issues with hematuria, improved after lowering dose to therapeutic dose but still having issues.  Recently transition to Lovenox for upcoming liver biopsy, wife reports resolution of discolored urine since stopping Xarelto.  Plans on pursuing cystoscopy in the near future.  Update 12/08/2021 JM: Returns for follow-up visit after prior visit 3 months ago for concern of worsening gait and functioning. Hx of prior stroke and parkinsonism. At prior visit, adjusted Sinemet dosage - currently on 25-100 1 tab 3x daily and ER 25-100 nightly. Per patient and wife, gait has remained the same - denies worsening but also denies any improvement. Tolerating Sinemet without side effects but no noticeable difference since dose change 3 months ago. Recently seen by Dr. Ethelene Hal for lower back issues and plans to  start PT for primarily lumbar spine on 3/2. Continues to use RW but limited ambulate and only short distance. Does c/o neck and shoulder pain which is chronic but gradually worsening. Limited neck ROM. Denies radiculopathy. No prior cervical imaging. At times, will have difficulty holding head up and sits in a hunched over position. He will have difficulty holding on to small, especially lighter objects, right > left since stroke. C/o shortness of breath after talking for prolonged periods of time which has been present over the past 4-5 months. Denies SOB on exertion but wife believes that he does. Wife also notes voice being softer. Denies any swallowing difficulties. Does have chronic diabetic neuropathy with some worsening over the past 6 months. Recent A1c satisfactory. BUE tremors stable. He also c/o continued fatigue.    Stable from stroke standpoint without new stroke/TIA symptoms. Compliant on Plavix and atorvastatin without side effects.  Blood pressure today 112/75. Recent lipid panel satisfactory.    No further concerns at this time     Update 09/14/2021 JM: Returns for sooner scheduled visit accompanied by his wife.  Concern regarding worsening gait and functioning since prior visit. He did have a fall on 8/23 with subsequent left acetabular fracture - ortho eval recommended nonoperative management.  Admitted to SNF rehab and discharged on 9/23. Reports since he returned home from rehab, gait has gradually worsened (increased shuffling and decreased step height) and has been requiring more assistance.  Working with [facility] PT initially 2x weekly now only 1x per week. Suffered a fall 10/29. Eval at [facility] walk-in clinic with xray showing possible compression fracture per wife report (unable to personally view via epic).  He is currently awaiting eval by orthopedics. He has had f/u with ortho for hip fx since returning home and f/u visit scheduled end of this month. Per wife, currently limited ambulation  and exercise - will only walk to go to the bathroom. PT currently on hold until further eval with our office and ortho.    Wife apparently increased Sinemet to 3 times daily (currently taking at 10am, 4pm and 10pm) approx 1 month ago. Per wife, slight improvement of gait and functioning since increase during the day but does have worsening gait and stiffness upon awakening. Follows with Dr. 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 Also reports hallucinations during SNF rehab stay - this has since improved.  Wife questions if Sinemet dosage needs to be adjusted   Eval by Dr. Frances Furbish for second opinion of gait disorder and wife concern of Parkinson's disease. Per OV note, possible vascular parkinsonism but current  presentation not supportive of idiopathic Parkinson's disease and to continue to Sinemet as he reports benefit.      Update 05/12/2021 Dr. Pearlean Brownie; He returns for follow-up after last visit with Shanda Bumps nurse practitioner in April 2022.  Is accompanied by his wife.  Patient has had no recurrent stroke or TIA symptoms.  He continues to have mild dizziness and imbalance in the right side paresthesias and diminished fine motor skills.  He occasionally has tingling of his fingers or his feet but these are not bothersome.  He is quite active and works out 3 days a week at [employer].  He remains on Plavix which is tolerating well without bruising or bleeding.  He takes Lipitor 20 mg daily and is tolerating it well.  He states his primary care physician did check his lipid profile and A1c about 5 weeks ago but I do not have those results and he was told they were satisfactory.  He does have chronic low back pain and also complains of fatigue and tiredness.  He has not discussed this with primary physician had any work-up done for this.  Patient has history of parkinsonian tremor and has a prescription for Sinemet 25/103 times daily but  states that he gets it only once a day or so as he forgets to take it and other times.  Tremor seems intermittent and not constant and is not functionally disabling.  Wife feels that he does have a shuffling gait particularly when he is tired.  He however denies significant bradykinesia and is able to get up and walk by himself easily without assistance.  He denies drooling of saliva or micrographia.  He has not had any particular worsening of his tremors or other parkinsonian symptoms in recent months.     Update 02/19/2021 JM: Mr. Holiday returns for acute visit per wife request due to recent onset of increased fatigue   Known hx of daytime fatigue with known OSA on CPAP and narcolepsy. Per wife, over the past 3 to 4 days, he has been experiencing increased daytime sleepiness where he is sleeping majority of the day as well as sleeping well at night.  Reports PCP recently changed Adderall IR to XR for narcolepsy but will occasionally miss a dose.  Also reports recently being started on sertraline 50 mg daily on 4/14 which he will take in the morning.  He also recently started PT on Monday for gait impairment and BPPV.  No other associated symptoms such as confusion, worsening cognition, weakness, numbness/tingling, speech deficit, visual changes or gait changes.  No further concerns at this time     Update 11/27/2020 JM: Mr. Gandy returns per patient request for sooner scheduled visit due to continued gait impairment and lower back pain.  He is currently unaccompanied.  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  Tremors have been stable without worsening.  He does feel as though his ambulation is worse with increased back pain. He did have a mechanical fall seen in ED on 11/05/2020 thankfully without injury.   He does have chronic back pain previously doing routine exercise prior to his stroke but has not returned back to routine exercise since. He  admits to sedentary life style with limited physical activity. He will fall asleep easily during the day watching TV and occasional issues with insomnia. 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  No further  concerns at this time.   Update 09/15/2020 JM: Mr. Hammons returns for stroke follow-up accompanied by his wife.  He has been doing well since prior visit with residual decreased right hand dexterity and decreased sensory right fingertips.  Denies new or worsening stroke/TIA symptoms. He does complain of short-term memory loss such as forgetting an actors name or random fact.  He also continues to experience R>L upper extremity tremors and gait impairment with imbalance with fall in 06/2020 resulting in nasal bone fracture.  Wife reports shuffling type gait when fatigued.  Remains on Plavix and atorvastatin for secondary stroke prevention without side effects.  Blood pressure today 126/73.  Glucose level stable per patient.  Reports ongoing compliance with CPAP for OSA management managed by pulmonology Dr. Maple Hudson.  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  He complains of excessive daytime fatigue, vivid dreams and occasional insomnia. Per patient, he has not trialed any other medication for narcolepsy.  No further concerns at this time.   Update 05/15/2020 JM: Mr. Cam is being seen for hospital follow-up accompanied by his wife.  He was discharged home from CIR on 04/29/2020 after 11 day stay.  Residual deficits of mild imbalance and mildly decreased sensation R finger tips and decreased right hand fine motor skills. Does report ongoing improvement currently working with home health PT. he has had gait impairment ongoing and right hand decreased fine motor control with tremors and was evaluated by Dr. Frances Furbish on 12/11/2019 for possible Parkinson's but was not felt this was Parkinson's related.  He does  continue to use cane for ambulation and denies any recent falls.  He is questioning return to driving as he was advised no driving at discharge until further cleared.  Completed 3 weeks DAPT and continues on Plavix alone without bleeding or bruising.  Continues on atorvastatin 40 mg daily without myalgias.  Blood pressure today 118/84.  Glucose levels stable.  Endorses ongoing use of CPAP for OSA management.  No further concerns at this time.    Stroke admission 04/14/2020 Mr. FIONN WILLITTS is a 82 y.o. male with history of  diabetes, stroke, hypertension, hyperlipidemia, permanent cardiac pacemaker  who presented on 04/14/2020 with R sided decreased sensation.  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  Previously on clopidogrel and recommended DAPT for 3 weeks then clopidogrel alone.  Presented in hypertensive urgency with BP 204/116 and recommended long-term BP goal normotensive range.  LDL 57 and recommended continuation of atorvastatin 40 mg daily.  Uncontrolled DM with A1c 7.9.  Other stroke risk factors include prior stroke 01/2018 with multifocal punctate infarcts concerning for embolic pattern, former tobacco use, EtOH use, obesity, CAD, OSA on CPAP and permanent cardiac pacemaker.  Evaluated by therapies who recommended CIR for residual deficits of imbalance and safety affecting ADLs and mobility.  Discharged to CIR on 04/18/2020.   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 Small vessel disease. Sinus dz. ASPECTS 10.    CTA head & neck no LVO. Aortic atherosclerosis.  MRI  L thalamic infarct. Small vessel disease. Atrophy.  2D Echo EF 65-70%. No source of embolus. LA mildly dilated.  Pacemaker interrogation no afib LDL 57 -continued atorvastatin 40 mg daily HgbA1c 7.9 Lovenox 40 mg sq daily for VTE prophylaxis clopidogrel 75 mg daily prior to admission, now on aspirin 81 mg daily and  clopidogrel  75 mg daily. Continue DAPT x 3 weeks then plavix alone   Therapy recommendations:  CIR Disposition:  CIR  REVIEW OF SYSTEMS: Out of a complete 14 system review of symptoms, the patient complains only of the following symptoms, and all other reviewed systems are negative.  ALLERGIES: Allergies  Allergen Reactions   Amantadines Other (See Comments)    Mental changes   Clonazepam Other (See Comments)    Mental changes   Zoloft [Sertraline] Other (See Comments)    Mental changes    HOME MEDICATIONS: Outpatient Medications Prior to Visit  Medication Sig Dispense Refill   acetaminophen (TYLENOL) 500 MG tablet Take 500-1,000 mg by mouth every 6 (six) hours as needed for moderate pain or mild pain.     atorvastatin (LIPITOR) 40 MG tablet Take 0.5 tablets (20 mg total) by mouth daily. 30 tablet 0   bifidobacterium infantis (ALIGN) capsule Take 1 capsule by mouth daily.     Camphor-Menthol-Methyl Sal (HM SALONPAS PAIN RELIEF EX) Apply 1 patch topically daily as needed (Back pain).     carbidopa-levodopa (SINEMET CR) 50-200 MG tablet TAKE 1 TABLET BY MOUTH EVERYDAY AT BEDTIME 90 tablet 1   carbidopa-levodopa (SINEMET IR) 25-100 MG tablet TAKE 1 AND HALF TABLETS 3 TIMES A DAY SPACED OUT BY 5 HOURS 405 tablet 0   cholecalciferol (VITAMIN D3) 25 MCG (1000 UNIT) tablet Take 1,000 Units by mouth daily.     clotrimazole-betamethasone (LOTRISONE) cream Apply 1 Application topically daily as needed (Rash).     docusate sodium (COLACE) 100 MG capsule Take 1 capsule (100 mg total) by mouth 2 (two) times daily. (Patient taking differently: Take 100 mg by mouth daily as needed for mild constipation or moderate constipation. As Needed) 10 capsule 0   feeding supplement, GLUCERNA SHAKE, (GLUCERNA SHAKE) LIQD Take 237 mLs by mouth 2 (two) times daily between meals. Alternate with ensure Diabetic     furosemide (LASIX) 20 MG tablet USE AS NEEDED FOR UP TO 5 DAYS IF SWELLING IN LEGS RECURS. CONTACT  CARDIOLOGY IS SWELLING DOES NOT IMPROVE OR GETS WORSE. 15 tablet 0   lidocaine (LIDODERM) 5 % Place 1 patch onto the skin daily as needed. Purchase over the counter. On for 12 hours and off for 12 hours 30 patch 0   magnesium gluconate (MAGONATE) 500 MG tablet Take 0.5 tablets (250 mg total) by mouth at bedtime. 30 tablet 0   Melatonin 10 MG TABS Take 10 mg by mouth at bedtime.     metFORMIN (GLUCOPHAGE) 500 MG tablet Take 500 mg by mouth in the morning, at noon, and at bedtime. 1000 mg in the am and 500 mg at night     modafinil (PROVIGIL) 200 MG tablet TAKE 1 TABLET BY MOUTH EVERY DAY 30 tablet 5   Multiple Vitamin (MULTIVITAMIN WITH MINERALS) TABS tablet Take 1 tablet by mouth daily.     NITROSTAT 0.4 MG SL tablet PLACE 1 TABLET (0.4 MG TOTAL) UNDER THE TONGUE EVERY 5 (FIVE) MINUTES AS NEEDED FOR CHEST PAIN. (Patient taking differently: Place 0.4 mg under the tongue every 5 (five) minutes as needed for chest pain.) 25 tablet 5   Omega-3 Fatty Acids (OMEGA 3 PO) Take 1,280 mg by mouth daily.     polyethylene glycol (MIRALAX / GLYCOLAX) 17 g packet Take 17 g by mouth daily. 14 each 0   polyvinyl alcohol (LIQUIFILM TEARS) 1.4 % ophthalmic solution Place 1 drop into both eyes as needed for dry eyes. (Patient taking differently: Place  1 drop into both eyes daily as needed for dry eyes.) 15 mL 0   QUEtiapine (SEROQUEL) 25 MG tablet TAKE 1/2 TABLETS (12.5 MG TOTAL) BY MOUTH AT BEDTIME. 45 tablet 1   Rivaroxaban (XARELTO) 15 MG TABS tablet Take 1 tablet (15 mg total) by mouth daily. 90 tablet 1   sitaGLIPtin (JANUVIA) 50 MG tablet Take 50 mg by mouth daily.     Vitamin D, Ergocalciferol, (DRISDOL) 1.25 MG (50000 UNIT) CAPS capsule TAKE 1 CAPSULE (50,000 UNITS TOTAL) BY MOUTH EVERY 7 (SEVEN) DAYS 5 capsule 0   metFORMIN (GLUCOPHAGE) 1000 MG tablet Take 1 tablet (1,000 mg total) by mouth daily with breakfast. (Patient taking differently: Take 2,000 mg by mouth 2 (two) times daily.) 30 tablet 0   metoprolol  tartrate (LOPRESSOR) 25 MG tablet Take 1 tablet (25 mg total) by mouth 2 (two) times daily. (Patient taking differently: Take 12.5 mg by mouth 2 (two) times daily.) 60 tablet 0   No facility-administered medications prior to visit.    PAST MEDICAL HISTORY: Past Medical History:  Diagnosis Date   Arthritis    R shoulder, bone spur   Arthritis    "hands" (03/25/2016)   Chronic diastolic heart failure (HCC)    Chronic lower back pain    Coronary heart disease    Dr Garnette Scheuermann   Diverticulitis 12/2021   DVT (deep venous thrombosis) (HCC) 05/2022   left leg   H/O cardiovascular stress test    perhaps last one was 2009   Heart murmur    12/29/11 echo: mild MR, no AS, trivial TR   History of gout    Hyperlipidemia    Hypertension    saw Dr. Mendel Ryder last earl;y- 2014, cardiac cath. last done ?2009, blocks seen didn't require any intervention at that point.   Narcolepsy    [name] 04-11-97; Mean Latency 1.57min, SOREM 2   OSA on CPAP    NPSG 12-13-98 AHI 22.7   PONV (postoperative nausea and vomiting)    Presence of permanent cardiac pacemaker    Shortness of breath    with exertion    Stroke (HCC)    Type II diabetes mellitus (HCC)     PAST SURGICAL HISTORY: Past Surgical History:  Procedure Laterality Date   CARDIAC CATHETERIZATION  2009?   EP IMPLANTABLE DEVICE N/A 03/25/2016   Procedure: Pacemaker Implant - Dual Chamber;  Surgeon: Marinus Maw, MD;  Location[facility] INVASIVE CV LAB;  Service: Cardiovascular;  Laterality: N/A;   FRACTURE SURGERY     HIP FRACTURE SURGERY     INSERT / REPLACE / REMOVE PACEMAKER  03/24/2016   INTRAMEDULLARY (IM) NAIL INTERTROCHANTERIC Left 02/08/2022   Procedure: INTRAMEDULLARY (IM) NAIL INTERTROCHANTRIC;  Surgeon: Cammy Copa, MD;  Location: [facility] OR;  Service: Orthopedics;  Laterality: Left;   IR KYPHO LUMBAR INC FX REDUCE BONE BX UNI/BIL CANNULATION INC/IMAGING  12/29/2021   IR RADIOLOGIST EVAL & MGMT  12/15/2021   IR RADIOLOGY PERIPHERAL  GUIDED IV START  07/14/2022   IR US GUIDE VASC ACCESS RIGHT  07/15/2022   LEFT AND RIGHT HEART CATHETERIZATION WITH CORONARY ANGIOGRAM N/A 06/05/2014   Procedure: LEFT AND RIGHT HEART CATHETERIZATION WITH CORONARY ANGIOGRAM;  Surgeon: Lesleigh Noe, MD;  Location: [facility] CATH LAB;  Service: Cardiovascular;  Laterality: N/A;   NASAL FRACTURE SURGERY     SHOULDER ARTHROSCOPY WITH ROTATOR CUFF REPAIR AND SUBACROMIAL DECOMPRESSION Right 08/16/2013   Procedure: SHOULDER ARTHROSCOPY WITH ROTATOR CUFF REPAIR AND SUBACROMIAL DECOMPRESSION;  Surgeon: Mable Paris, MD;  Location: [facility] OR;  Service: Orthopedics;  Laterality: Right;  Right shoulder arthroscopy rotator cuff repair, subacromial decompression.   TONSILLECTOMY      FAMILY HISTORY: Family History  Problem Relation Age of Onset   Sleep apnea Sister    Colon cancer Sister     SOCIAL HISTORY: Social History   Socioeconomic History   Marital status: Married    Spouse name: [name]   Number of children: Not on file   Years of education: Not on file   Highest education level: Not on file  Occupational History   Occupation: [employer]   Occupation: [place] [employer]  Tobacco Use   Smoking status: Former    Current packs/day: 0.00    Average packs/day: 2.5 packs/day for 6.0 years (15.0 ttl pk-yrs)    Types: Cigarettes    Start date: 06/04/1961    Quit date: 06/05/1967    Years since quitting: 56.2   Smokeless tobacco: Never  Vaping Use   Vaping status: Never Used  Substance and Sexual Activity   Alcohol use: Yes    Comment: rare   Drug use: No   Sexual activity: Not Currently  Other Topics Concern   Not on file  Social History Narrative   Lives with wife and son   Right handed   Drinks 1 cups caffeine daily   Social Determinants of Health   Financial Resource Strain: Not on file  Food Insecurity: Not on file  Transportation Needs: Not on file  Physical Activity: Not on file  Stress: Not on file  Social  Connections: Not on file  Intimate Partner Violence: Not on file      PHYSICAL EXAM  Vitals:   08/17/23 1335  BP: 100/65  Pulse: 73  Weight: 212 lb (96.2 kg)  Height: 5\' 9"  (1.753 m)    Body mass index is 31.31 kg/m.  Generalized: Well developed, very pleasant elderly Caucasian male, in no acute distress   Neurological examination  Mentation: Alert oriented to time, place, history taking. Follows all commands speech and language fluent. Mild to mod hypophonia.  Decreased facial expression. Cranial nerve II-XII: Pupils were equal round reactive to light. Extraocular movements were full, visual field were full on confrontational test. Facial sensation and strength were normal. Uvula tongue midline. Head turning and shoulder shrug  were normal and symmetric. Motor: The motor testing reveals 5 over 5 strength of all 4 extremities.  Unable to appreciate resting tremor.  Slight cogwheel rigidity upon activation R>L.  Finger taps and toe taps mildly impaired Sensory: Sensory testing is intact to soft touch on all 4 extremities. No evidence of extinction is noted.  Gait and station: Deferred     DIAGNOSTIC DATA (LABS, IMAGING, TESTING) - I reviewed patient records, labs, notes, testing and imaging myself where available.  Lab Results  Component Value Date   WBC 9.3 08/05/2023   HGB 12.1 (L) 08/05/2023   HCT 37.7 (L) 08/05/2023   MCV 87.3 08/05/2023   PLT 309 08/05/2023      Component Value Date/Time   NA 139 08/05/2023 1201   NA 145 07/23/2021 0000   K 4.1 08/05/2023 1201   CL 104 08/05/2023 1201   CO2 25 08/05/2023 1201   GLUCOSE 171 (H) 08/05/2023 1201   BUN 23 08/05/2023 1201   BUN 14 07/23/2021 0000   CREATININE 1.05 08/05/2023 1201   CREATININE 1.01 08/17/2022 1509   CREATININE 1.16 03/18/2016 1011   CALCIUM 9.8 08/05/2023  1201   PROT 7.4 08/05/2023 1201   ALBUMIN 3.8 08/05/2023 1201   AST 17 08/05/2023 1201   AST 22 08/17/2022 1509   ALT <5 08/05/2023 1201    ALT 6 08/17/2022 1509   ALKPHOS 75 08/05/2023 1201   BILITOT 0.4 08/05/2023 1201   BILITOT 0.4 08/17/2022 1509   GFRNONAA >60 08/05/2023 1201   GFRNONAA >60 08/17/2022 1509   GFRAA >90 07/23/2021 0000   Lab Results  Component Value Date   CHOL 123 04/29/2022   HDL 44 04/29/2022   LDLCALC 51 04/29/2022   TRIG 141 04/29/2022   CHOLHDL 2.8 04/29/2022   Lab Results  Component Value Date   HGBA1C 8.4 (H) 04/29/2022   Lab Results  Component Value Date   VITAMINB12 695 12/08/2021   Lab Results  Component Value Date   TSH 1.030 12/19/2021      ASSESSMENT AND PLAN QWENTIN MANER is a 82 y.o. 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  S/p left acetabular fracture 06/23/2021 after a mechanical fall with worsening gait and increased need for assistance since return home from SNF rehab and recurrent fall with resultant left hip fracture 01/2022 requiring surgical procedure.  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 He also has vascular parkinsonism which has shown some response to Sinemet     1.  Vascular parkinsonism -Continue Sinemet IR 25-100 mg 1.5 tablets 3 times a day every 4-5 hours -Continue Sinemet CR 1 tab at bedtime -Encouraged the patient to stay well-hydrated with water -restart therapies once able, advised to call if they wish to be seen by [facility] d/t limited scheduling availability at main location -continued use of RW at all times -continue colace and miralax as needed for constipation     Follow-up in 6 months or call earlier if needed    I spent 30 minutes of face-to-face and non-face-to-face time with patient and wife.  This included previsit chart review, lab review, study review, order entry, electronic health record  documentation, patient and wife education and discussion regarding above diagnoses and treatment plan and answered all other questions to patient and wife's satisfaction  Ihor Austin, AGNP-BC  [facility] [address] [place], [place] [address]  Phone [phone] Fax [phone] Note: This document was prepared with digital dictation and possible smart phrase technology. Any transcriptional errors that result from this process are unintentional.

## 2023-08-17 NOTE — Patient Instructions (Addendum)
Your Plan:  You can contact [facility] to discuss starting therapies at their location - if you need assistance/new orders, please let me or Dr. Carlis Abbott know!   Continue Sinemet IR 1.5 tab three times daily and Sinemet CR 1 tab nightly   Ensure you are routinely drinking water as this will help with constipation and overall make you feel better     Follow up in 6 months or call earlier if needed     Thank you for coming to see [place] at [facility]. I hope we have been able to provide you high quality care today.  You may receive a patient satisfaction survey over the next few weeks. We would appreciate your feedback and comments so that we may continue to improve ourselves and the health of our patients.

## 2023-09-11 ENCOUNTER — Encounter: Payer: Self-pay | Admitting: Hematology

## 2023-09-22 ENCOUNTER — Encounter: Payer: Self-pay | Admitting: Hematology

## 2023-10-05 DIAGNOSIS — M9904 Segmental and somatic dysfunction of sacral region: Secondary | ICD-10-CM | POA: Diagnosis not present

## 2023-10-05 DIAGNOSIS — M9905 Segmental and somatic dysfunction of pelvic region: Secondary | ICD-10-CM | POA: Diagnosis not present

## 2023-10-05 DIAGNOSIS — M5136 Other intervertebral disc degeneration, lumbar region with discogenic back pain only: Secondary | ICD-10-CM | POA: Diagnosis not present

## 2023-10-05 DIAGNOSIS — M9903 Segmental and somatic dysfunction of lumbar region: Secondary | ICD-10-CM | POA: Diagnosis not present

## 2023-10-11 ENCOUNTER — Encounter
Payer: Medicare (Managed Care) | Attending: Physical Medicine and Rehabilitation | Admitting: Physical Medicine and Rehabilitation

## 2023-10-11 VITALS — BP 122/78 | HR 76 | Ht 69.0 in | Wt 211.0 lb

## 2023-10-11 DIAGNOSIS — R5383 Other fatigue: Secondary | ICD-10-CM | POA: Diagnosis not present

## 2023-10-11 DIAGNOSIS — R29898 Other symptoms and signs involving the musculoskeletal system: Secondary | ICD-10-CM | POA: Diagnosis not present

## 2023-10-11 DIAGNOSIS — E1151 Type 2 diabetes mellitus with diabetic peripheral angiopathy without gangrene: Secondary | ICD-10-CM | POA: Diagnosis not present

## 2023-10-11 DIAGNOSIS — E1142 Type 2 diabetes mellitus with diabetic polyneuropathy: Secondary | ICD-10-CM | POA: Diagnosis not present

## 2023-10-11 DIAGNOSIS — L603 Nail dystrophy: Secondary | ICD-10-CM | POA: Diagnosis not present

## 2023-10-11 DIAGNOSIS — M545 Low back pain, unspecified: Secondary | ICD-10-CM | POA: Diagnosis not present

## 2023-10-11 DIAGNOSIS — G8929 Other chronic pain: Secondary | ICD-10-CM | POA: Diagnosis not present

## 2023-10-11 DIAGNOSIS — G629 Polyneuropathy, unspecified: Secondary | ICD-10-CM | POA: Insufficient documentation

## 2023-10-11 DIAGNOSIS — I739 Peripheral vascular disease, unspecified: Secondary | ICD-10-CM | POA: Diagnosis not present

## 2023-10-11 NOTE — Progress Notes (Signed)
Subjective:    Patient ID: Jordan [name], male    DOB: 09/22/1941, 82 y.o.   MRN: [id number]  HPI Jordan [name] is an 82 year old man who presents for follow-up after hip fracture.   1) Left hip fracture:  -not walking much, but can walk with rolling walker I condominium unit.  -his wife accompanies him today and provides most of history -his walking discharge is very short.  -no longer painful -has weakness in bilateral lower extremities -no more falls.  -no more pain  2) Parkinson's: -sometimes muscles don't cooperate the way they should. -he has vascular parkinsonism -has stooped posture -walking better -graduated from PT -spoke to a woman about a program at [facility]  3) Right leg weakness: -when walking.  -wife is nervous about him falling -he was in skilled rehab and was discharged June 8th  -they have not yet set up outpatient therapy -he has not had any professional therapy since discharge on June 8th.   4) 6 bottom teeth removed -has more of a challenge in forming words.   5) Neuroendocrine tumor -being treated with monthly octreotide -no current symptoms currently -was having persistent abdominal pain- they thought it was a flare of diverticulitis but this was not present -CBGs increase because of octreotide- CBGs as high as 250/260 -he asks for icecream  6) Low back pain -about 4/10 currently  7) Fatigue:  -he has been feeling fatigued recenttly -not taking 200mg  daily  Pain Inventory Average Pain 2 Pain Right Now 2 My pain is dull  LOCATION OF PAIN  back and lower left hip  BOWEL Number of stools per week: 1-2 Oral laxative use Yes  Type of laxative Miralax, senna plus   BLADDER Pads Bladder incontinence Yes  Frequent urination No  Leakage with coughing No  Difficulty starting stream No  Incomplete bladder emptying No    Mobility walk with assistance use a walker how many minutes can you walk? 1-2 ability to climb steps?  no do  you drive?  no use a wheelchair needs help with transfers Do you have any goals in this area?  yes  Function retired  Neuro/Psych bladder control problems weakness tremor trouble walking dizziness loss of taste or smell  Prior Studies Any changes since last visit?  no  Physicians involved in your care Any changes since last visit?  no   Family History  Problem Relation Age of Onset   Sleep apnea Sister    Colon cancer Sister    Social History   Socioeconomic History   Marital status: Married    Spouse name: [name]   Number of children: Not on file   Years of education: Not on file   Highest education level: Not on file  Occupational History   Occupation: [employer]   Occupation: [place] [employer]  Tobacco Use   Smoking status: Former    Current packs/day: 0.00    Average packs/day: 2.5 packs/day for 6.0 years (15.0 ttl pk-yrs)    Types: Cigarettes    Start date: 06/04/1961    Quit date: 06/05/1967    Years since quitting: 56.3   Smokeless tobacco: Never  Vaping Use   Vaping status: Never Used  Substance and Sexual Activity   Alcohol use: Yes    Comment: rare   Drug use: No   Sexual activity: Not Currently  Other Topics Concern   Not on file  Social History Narrative   Lives with wife and son  Right handed   Drinks 1 cups caffeine daily   Social Determinants of Health   Financial Resource Strain: Not on file  Food Insecurity: Not on file  Transportation Needs: Not on file  Physical Activity: Not on file  Stress: Not on file  Social Connections: Not on file   Past Surgical History:  Procedure Laterality Date   CARDIAC CATHETERIZATION  2009?   EP IMPLANTABLE DEVICE N/A 03/25/2016   Procedure: Pacemaker Implant - Dual Chamber;  Surgeon: Marinus Maw, MD;  Location: [facility] INVASIVE CV LAB;  Service: Cardiovascular;  Laterality: N/A;   FRACTURE SURGERY     HIP FRACTURE SURGERY     INSERT / REPLACE / REMOVE PACEMAKER  03/24/2016    INTRAMEDULLARY (IM) NAIL INTERTROCHANTERIC Left 02/08/2022   Procedure: INTRAMEDULLARY (IM) NAIL INTERTROCHANTRIC;  Surgeon: Cammy Copa, MD;  Location: [facility] OR;  Service: Orthopedics;  Laterality: Left;   IR KYPHO LUMBAR INC FX REDUCE BONE BX UNI/BIL CANNULATION INC/IMAGING  12/29/2021   IR RADIOLOGIST EVAL & MGMT  12/15/2021   IR RADIOLOGY PERIPHERAL GUIDED IV START  07/14/2022   IR US GUIDE VASC ACCESS RIGHT  07/15/2022   LEFT AND RIGHT HEART CATHETERIZATION WITH CORONARY ANGIOGRAM N/A 06/05/2014   Procedure: LEFT AND RIGHT HEART CATHETERIZATION WITH CORONARY ANGIOGRAM;  Surgeon: Lesleigh Noe, MD;  Location: [facility] CATH LAB;  Service: Cardiovascular;  Laterality: N/A;   NASAL FRACTURE SURGERY     SHOULDER ARTHROSCOPY WITH ROTATOR CUFF REPAIR AND SUBACROMIAL DECOMPRESSION Right 08/16/2013   Procedure: SHOULDER ARTHROSCOPY WITH ROTATOR CUFF REPAIR AND SUBACROMIAL DECOMPRESSION;  Surgeon: Mable Paris, MD;  Location: [facility] OR;  Service: Orthopedics;  Laterality: Right;  Right shoulder arthroscopy rotator cuff repair, subacromial decompression.   TONSILLECTOMY     Past Medical History:  Diagnosis Date   Arthritis    R shoulder, bone spur   Arthritis    "hands" (03/25/2016)   Chronic diastolic heart failure (HCC)    Chronic lower back pain    Coronary heart disease    Dr Garnette Scheuermann   Diverticulitis 12/2021   DVT (deep venous thrombosis) (HCC) 05/2022   left leg   H/O cardiovascular stress test    perhaps last one was 2009   Heart murmur    12/29/11 echo: mild MR, no AS, trivial TR   History of gout    Hyperlipidemia    Hypertension    saw Dr. Mendel Ryder last earl;y- 2014, cardiac cath. last done ?2009, blocks seen didn't require any intervention at that point.   Narcolepsy    [name] 04-11-97; Mean Latency 1.35min, SOREM 2   OSA on CPAP    NPSG 12-13-98 AHI 22.7   PONV (postoperative nausea and vomiting)    Presence of permanent cardiac pacemaker    Shortness of breath     with exertion    Stroke (HCC)    Type II diabetes mellitus (HCC)    There were no vitals taken for this visit.  Opioid Risk Score:   Fall Risk Score:  `1  Depression screen [facility] 2/9     04/12/2023   12:50 PM 01/14/2023   11:38 AM 10/14/2022   11:55 AM 04/19/2022   11:18 AM 05/09/2020   10:37 AM  Depression screen PHQ 2/9  Decreased Interest 0 0 0 0 0  Down, Depressed, Hopeless 0 0 0 1 0  PHQ - 2 Score 0 0 0 1 0  Altered sleeping    0   Tired, decreased  energy    1   Change in appetite    0   Feeling bad or failure about yourself     0   Trouble concentrating    0   Moving slowly or fidgety/restless    1   Suicidal thoughts    0   PHQ-9 Score    3      Review of Systems  Constitutional: Negative.   HENT: Negative.    Eyes: Negative.   Respiratory:  Positive for apnea and cough.   Cardiovascular: Negative.   Gastrointestinal:  Positive for constipation.  Endocrine: Negative.   Genitourinary:  Positive for urgency.  Musculoskeletal:  Positive for back pain and gait problem.  Skin: Negative.   Allergic/Immunologic: Negative.   Neurological:  Positive for dizziness, tremors and weakness.  Hematological:  Bruises/bleeds easily.  Psychiatric/Behavioral: Negative.        Objective:   Physical Exam Gen: no distress, normal appearing, 118/75, BMI 32.08, weigh 211 lbs.  Gen: no distress, normal appearing HEENT: oral mucosa pink and moist, NCAT Cardio: Reg rate Chest: normal effort, normal rate of breathing Abd: soft, non-distended Ext: no edema Psych: pleasant, normal affect Skin: intact Neuro: Alert, flat affect, good sense of humor, wife has to provide most of history, =word finding difficulties Musculoskeletal: In WC, able to use bathroom with wife's assistance, 5/5 strength both legs     Assessment & Plan:  1) CVA/impaired mobility and ADLs -discussed whether he would benefit from refresher of PT and OT and SLP -would benefit from handicap placard to increase  mobility in the community -reviewed all medications and provided necessary refills. -discussed vagal nerve stimulation if strength fails to improve in 6 months.  -discussed maximizing protein -discussed word finding difficulties -discussed hyperbaric oxygen therapy -discussed current mobility  2) Fatigue -continue vitamin D supplement, recommended D3 daily rather than D2 -replace amantadine with modafinil. Continue modafinil 200mg  daily  Provided dietary education  3) R leg weakess: -discussed relation with quadriceps weakness.  -discussed that he completed PT Prescribing Home Zynex NexWave Stimulator Device and supplies as needed. IFC, NMES and TENS medically necessary Treatment Rx: Daily @ 30-40 minutes per treatment PRN. Zynex NexWave only, no substitutions. Treatment Goals: 1) To reduce and/or eliminate pain 2) To improve functional capacity and Activities of daily living 3) To reduce or prevent the need for oral medications 4) To improve circulation in the injured region 5) To decrease or prevent muscle spasm and muscle atrophy 6) To provide a self-management tool to the patient The patient has not sufficiently improved with conservative care. Numerous studies indexed by Medline and PubMed.gov have shown Neuromuscular, Interferential, and TENS stimulators to reduce pain, improve function, and reduce medication use in injured patients. Continued use of this evidence based, safe, drug free treatment is both reasonable and medically necessary at this time.   4) Neuroendocrine tumor -discussed octreotide.  -discussed decreased appetite  5) Parkinson's Disease -continue Sinemet -discussed current symptoms -prescribed PT, continue HEP  6) Impaired mobility and ADLs -discussed OT  7) Hypophonic speech:  -referred SLP -discussed that vocal quality has not improved -discussed that wife reminds him to speak louder  8) Hypotension:  -d/c metoprolol  9) Obesity: -discussed that he  lost 2 lbs since last visit -discussed that in the morning he has a protein shake and something sweet and after that he is not very hungry.  -discussed that albumin is normal  10) Type 2 diabetes: -discussed that last HgbA1c is 7.8, down  drom 10 -discussed that it had probably increased due to the octreotide.  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 IFC, NMES and TENS medically necessary Treatment Rx: Daily @ 30-40 minutes per treatment PRN. Zynex NexWave only, no substitutions. Treatment Goals: 1) To reduce and/or eliminate pain 2) To improve functional capacity and Activities of daily living 3) To reduce or prevent the need for oral medications 4) To improve circulation in the injured region 5) To decrease or prevent muscle spasm and muscle atrophy 6) To provide a self-management tool to the patient The patient has not sufficiently improved with conservative care. Numerous studies indexed by Medline and PubMed.gov have shown Neuromuscular, Interferential, and TENS stimulators to reduce pain, improve function, and reduce medication use in injured patients. Continued use of this evidence based, safe, drug free treatment is both reasonable and medically necessary at this time.   Prescribing Home Zynex NexWave Stimulator Device and supplies as needed. IFC, NMES and TENS medically necessary Treatment Rx: Daily @ 30-40 minutes per treatment PRN. Zynex NexWave only, no substitutions. Treatment Goals: 1) To reduce and/or eliminate pain 2) To improve functional capacity and Activities of daily living 3) To reduce or prevent the need for oral medications 4) To improve circulation in the injured region 5) To decrease or prevent muscle spasm and muscle atrophy 6) To provide a self-management tool  to the patient The patient has not sufficiently improved with conservative care. Numerous studies indexed by Medline and PubMed.gov have shown Neuromuscular, Interferential, and TENS stimulators to reduce pain, improve function, and reduce medication use in injured patients. Continued use of this evidence based, safe, drug free treatment is both reasonable and medically necessary at this time.   13) Insomnia: -discussed naps and sleep time -discussed pistachios  14) Neuropathy -Discussed Qutenza as an option for neuropathic pain control. Discussed that this is a capsaicin patch, stronger than capsaicin cream. Discussed that it is currently approved for diabetic peripheral neuropathy and post-herpetic neuralgia, but that it has also shown benefit in treating other forms of neuropathy. Provided patient with link to site to learn more about the patch: https://www.clark.biz/. Discussed that the patch would be placed in office and benefits usually last 3 months. Discussed that unintended exposure to capsaicin can cause severe irritation of eyes, mucous membranes, respiratory tract, and skin, but that Qutenza is a local treatment and does not have the systemic side effects of other nerve medications. Discussed that there may be pain, itching, erythema, and decreased sensory function associated with the application of Qutenza. Side effects usually subside within 1 week. A cold pack of analgesic medications can help with these side effects. Blood pressure can also be increased due to pain associated with administration of the patch.   Prescribing Home Zynex NexWave Stimulator Device and supplies as needed. IFC, NMES and TENS medically necessary Treatment Rx: Daily @ 30-40 minutes per treatment PRN. Zynex NexWave only, no substitutions. Treatment Goals: 1) To reduce and/or eliminate pain 2) To improve functional capacity and Activities of daily living 3) To reduce or prevent the need for oral medications 4) To  improve circulation in the injured region 5) To decrease or prevent muscle spasm and muscle atrophy 6) To provide a self-management tool to the patient The patient has not sufficiently improved with conservative  care. Numerous studies indexed by Medline and PubMed.gov have shown Neuromuscular, Interferential, and TENS stimulators to reduce pain, improve function, and reduce medication use in injured patients. Continued use of this evidence based, safe, drug free treatment is both reasonable and medically necessary at this time.

## 2023-10-11 NOTE — Patient Instructions (Addendum)
Methylated B vitamin - daily D3 daily 2,000U Stop D2 Mark Hyman's brand

## 2023-10-13 ENCOUNTER — Ambulatory Visit: Payer: Medicare (Managed Care) | Attending: Student | Admitting: Student

## 2023-10-13 NOTE — Progress Notes (Unsigned)
  Electrophysiology Office Note:   ID:  Jordan, [name] 1941/04/08, MRN [id number]  Primary Cardiologist: None Electrophysiologist: Lewayne Bunting, MD  {Click to update primary MD,subspecialty MD or APP then REFRESH:1}    History of Present Illness:   Jordan [name] is a 82 y.o. male with h/o CAD, Chronic diastolic CHF, SND, DM, HTN, HLD, OSA on CPAP, Narcolepsy, Parkinsons disease, and CVA seen today for routine electrophysiology followup.   Since last being seen in our clinic the patient reports doing ***.  he denies chest pain, palpitations, dyspnea, PND, orthopnea, nausea, vomiting, dizziness, syncope, edema, weight gain, or early satiety.   Review of systems complete and found to be negative unless listed in HPI.   EP Information / Studies Reviewed:    EKG is ordered today. Personal review as below.       PPM Interrogation-  reviewed in detail today,  See PACEART report.  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

## 2023-10-17 ENCOUNTER — Telehealth: Payer: Self-pay

## 2023-10-17 NOTE — Telephone Encounter (Signed)
Alert received from [employer]:  Details for arrhythmia episode received Details were received for a HVR episode, which was detected on Oct 14, 2023, 5:55:52 PM  Episode of NSVT.  Pt overdue for follow up.  Message sent to scheduler.

## 2023-10-27 ENCOUNTER — Ambulatory Visit (INDEPENDENT_AMBULATORY_CARE_PROVIDER_SITE_OTHER): Payer: Medicare (Managed Care)

## 2023-10-27 DIAGNOSIS — I495 Sick sinus syndrome: Secondary | ICD-10-CM | POA: Diagnosis not present

## 2023-10-29 LAB — CUP PACEART REMOTE DEVICE CHECK
Date Time Interrogation Session: 20241226072801
Implantable Lead Connection Status: 753985
Implantable Lead Connection Status: 753985
Implantable Lead Implant Date: 20170525
Implantable Lead Implant Date: 20170525
Implantable Lead Location: 753859
Implantable Lead Location: 753860
Implantable Lead Model: 377
Implantable Lead Model: 377
Implantable Lead Serial Number: 49470224
Implantable Lead Serial Number: 49485613
Implantable Pulse Generator Implant Date: 20170525
Pulse Gen Model: 394969
Pulse Gen Serial Number: 68798589

## 2023-11-07 ENCOUNTER — Encounter: Payer: Self-pay | Admitting: Hematology

## 2023-11-13 NOTE — Progress Notes (Signed)
 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 VN Vet NPSG 12/13/98- AHI 22.7/hr Office Spirometry 01/31/14-  Within normal limits-FVC 3.09/77%, FEV1 2.45/80% , FEV1/FVC 0.79, FEF 25-75% 2.45/87%.  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  Doing well so far on this machine.  Discuss his  Adderall He says he is fully recovered after hospitalization in March.  Continues physical therapy. Download is not available.  We are asking DME company to provide ST card and activate Airview.  He reports good compliance and sleeps well with CPAP. We discussed trying caffeine tablets instead of Adderall, seeking to wean away from that if okay with cardiology. CXR 1V 01/03/22- IMPRESSION: Constellation of findings are favored to reflect mild pulmonary edema with LEFT basilar atelectasis.  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  History of Present Illness   The patient, with a history of sleep apnea and mobility issues, had difficulty using  his walker, resulting in a scrape on his leg trying to get here today. He reports this incident occurred earlier in the day and he has since applied a bandage to the area. He also expresses difficulty with his CPAP machine, specifically with dryness in his breathing passages. He reports sometimes removing the CPAP machine during the night when the dryness becomes too uncomfortable, but otherwise uses it consistently. He also mentions taking modafinil , administered by a caregiver, but does not specify the reason for this medication.     ROS-see HPI    + = positive Constitutional:   No-   weight loss, night sweats, fevers, chills, fatigue, lassitude. HEENT:   No-  headaches, difficulty swallowing, tooth/dental problems, sore throat,       No-  sneezing, itching, ear ache, nasal congestion, post nasal drip,  CV:  No-   chest pain, orthopnea, PND, swelling in lower extremities, anasarca, dizziness, palpitations Resp: + shortness of breath with exertion or at rest.              No-   productive cough,  No non-productive cough,  No- coughing up of blood.              No-   change in color of mucus.  No- wheezing.   Skin: No-   rash or lesions. GI:  No-   heartburn, indigestion, abdominal pain, nausea, vomiting,  GU:  MS:  . Neuro-     +HPI Psych:  No- change in mood or affect. No depression or anxiety.  No memory loss.  OBJ- Physical Exam General- Alert, Oriented, Affect-appropriate, Distress- none acute. +rolling walker + overweight, +wheelchair Skin- rash-none, lesions- none, excoriation- none Lymphadenopathy- none  Head- atraumatic            Eyes- Gross vision intact, PERRLA, conjunctivae and secretions clear            Ears- Hearing, canals-normal            Nose- Clear, no-Septal dev, mucus, polyps, erosion, perforation             Throat- Mallampati III , mucosa clear , drainage- none, tonsils- atrophic Neck- flexible , trachea midline, no stridor , thyroid  nl, carotid no bruit Chest -  symmetrical excursion , unlabored           Heart/CV- RRR , no murmur , no gallop  , no rub, nl s1 s2                           - JVD- none , edema- none, stasis changes- none, varices- none           Lung- +few basilar crackles wheeze- none, cough- none , dullness-none, rub- none           Chest wall-  + pacemaker,  no rub, pacer pocket not involved. Abd- Br/ Gen/ Rectal- Not done, not indicated Extrem- cyanosis- none, clubbing, none, atrophy- none, strength- nl Neuro- grossly intact to observation, no tremor, + speech clear  Assessment and Plan    Obstructive Sleep Apnea Patient is compliant with CPAP use and reports good control. Discussed dryness issues with CPAP use and advised on adjusting humidifier settings. -Continue CPAP use as tolerated. -Adjust humidifier settings as needed for comfort. -Continue remote monitoring of CPAP usage.  Laceration Patient reports a minor laceration on the leg from a walker. Currently bandaged. -Leave bandage undisturbed if sufficient.  Modafinil  use Patient reports taking Modafinil , prescribed for alertness. -Continue Modafinil  as prescribed. -Contact office or pharmacy for refills as needed.  General Health Maintenance -Continue current management of other health conditions with respective specialists. -Follow-up as needed.

## 2023-11-15 ENCOUNTER — Encounter: Payer: Self-pay | Admitting: Internal Medicine

## 2023-11-15 ENCOUNTER — Ambulatory Visit: Payer: Medicare (Managed Care) | Admitting: Internal Medicine

## 2023-11-15 VITALS — BP 124/60 | HR 85 | Temp 97.1°F | Ht 69.0 in | Wt 212.6 lb

## 2023-11-15 DIAGNOSIS — G4733 Obstructive sleep apnea (adult) (pediatric): Secondary | ICD-10-CM

## 2023-11-15 NOTE — Patient Instructions (Signed)
 Glad you are doing well with your CPAP. We can continue set at 9 cwp.  If it is too dry, you can look at directions in the little booklet that came with your machine, or you can call the [employer] home care company for help adjusting the humidifier as well, if you need to.

## 2023-11-24 NOTE — Therapy (Incomplete)
[facility] [address] [place], [place], [address] Phone: [phone]   Fax:  [phone]  Patient Details  Name: Jordan [name] MRN: [id number] Date of Birth: Jun 10, 1941 Referring Provider:  Noberto Retort, MD  Encounter Date: 11/29/2023  Occupational Therapy Parkinson's Disease Screen  Hand dominance:  ***   Physical Performance Test item #2 (simulated eating):  *** sec  Physical Performance Test item #4 (donning/doffing jacket):  *** sec  Fastening/unfastening 3 buttons in:  ***sec  9-hole peg test:    RUE  *** sec        LUE  *** sec  Box & Blocks Test:   RUE  *** blocks        LUE  *** blocks  Standing Functional Reach Test:   RUE  *** inches        LUE  *** inches  Change in ability to perform ADLs/IADLs:  ***  Other Comments:  ***  Pt would benefit from occupational therapy evaluation due to  ***  Pt does not require occupational therapy services at this time.  Recommended occupational therapy screen in   ***    Sheran Lawless, [place] 11/24/2023, 8:03 AM  [facility] [address] [place], [place], [address] Phone: [phone]   Fax:  [phone]

## 2023-11-29 ENCOUNTER — Ambulatory Visit: Payer: Medicare (Managed Care) | Admitting: Speech Pathology

## 2023-11-29 ENCOUNTER — Ambulatory Visit: Payer: Medicare (Managed Care) | Admitting: Occupational Therapy

## 2023-11-29 ENCOUNTER — Ambulatory Visit: Payer: Medicare (Managed Care) | Admitting: Physical Therapy

## 2023-11-30 ENCOUNTER — Encounter: Payer: Self-pay | Admitting: General Practice

## 2023-12-06 ENCOUNTER — Other Ambulatory Visit: Payer: Self-pay | Admitting: Internal Medicine

## 2023-12-11 NOTE — Telephone Encounter (Signed)
 Received a refill request for patient's modafinil  200mg . Last OV was on 11/15/23 with Dr. Linder Revere and he was advised to follow up in a year.   Dr. Linder Revere, please advise if you are ok with this refill. Thanks!

## 2023-12-12 NOTE — Telephone Encounter (Signed)
Modafinil refilled.

## 2023-12-13 ENCOUNTER — Other Ambulatory Visit: Payer: Medicare (Managed Care)

## 2023-12-13 ENCOUNTER — Inpatient Hospital Stay: Payer: Medicare (Managed Care)

## 2023-12-13 ENCOUNTER — Other Ambulatory Visit: Payer: Self-pay | Admitting: Nurse Practitioner

## 2023-12-13 ENCOUNTER — Inpatient Hospital Stay: Payer: Medicare (Managed Care) | Attending: Nurse Practitioner | Admitting: Nurse Practitioner

## 2023-12-13 VITALS — BP 124/75 | HR 69 | Temp 97.9°F | Resp 19 | Wt 221.3 lb

## 2023-12-13 DIAGNOSIS — C7B8 Other secondary neuroendocrine tumors: Secondary | ICD-10-CM

## 2023-12-13 DIAGNOSIS — C7A8 Other malignant neuroendocrine tumors: Secondary | ICD-10-CM | POA: Diagnosis present

## 2023-12-13 DIAGNOSIS — Z79899 Other long term (current) drug therapy: Secondary | ICD-10-CM | POA: Diagnosis not present

## 2023-12-13 LAB — CMP (CANCER CENTER ONLY)
ALT: 5 U/L (ref 0–44)
AST: 20 U/L (ref 15–41)
Albumin: 3.8 g/dL (ref 3.5–5.0)
Alkaline Phosphatase: 63 U/L (ref 38–126)
Anion gap: 8 (ref 5–15)
BUN: 16 mg/dL (ref 8–23)
CO2: 28 mmol/L (ref 22–32)
Calcium: 9.5 mg/dL (ref 8.9–10.3)
Chloride: 103 mmol/L (ref 98–111)
Creatinine: 1.02 mg/dL (ref 0.61–1.24)
GFR, Estimated: >60 mL/min (ref >60)
Glucose, Bld: 248 mg/dL — ABNORMAL HIGH (ref 70–99)
Potassium: 4.2 mmol/L (ref 3.5–5.1)
Sodium: 139 mmol/L (ref 135–145)
Total Bilirubin: 0.4 mg/dL (ref 0.0–1.2)
Total Protein: 7.1 g/dL (ref 6.5–8.1)

## 2023-12-13 LAB — CBC WITH DIFFERENTIAL (CANCER CENTER ONLY)
Abs Immature Granulocytes: 0.04 10*3/uL (ref 0.00–0.07)
Basophils Absolute: 0.1 10*3/uL (ref 0.0–0.1)
Basophils Relative: 1 %
Eosinophils Absolute: 0.4 10*3/uL (ref 0.0–0.5)
Eosinophils Relative: 4 %
HCT: 34 % — ABNORMAL LOW (ref 39.0–52.0)
Hemoglobin: 10.3 g/dL — ABNORMAL LOW (ref 13.0–17.0)
Immature Granulocytes: 0 %
Lymphocytes Relative: 21 %
Lymphs Abs: 1.9 10*3/uL (ref 0.7–4.0)
MCH: 25.3 pg — ABNORMAL LOW (ref 26.0–34.0)
MCHC: 30.3 g/dL (ref 30.0–36.0)
MCV: 83.5 fL (ref 80.0–100.0)
Monocytes Absolute: 0.8 10*3/uL (ref 0.1–1.0)
Monocytes Relative: 9 %
Neutro Abs: 5.7 10*3/uL (ref 1.7–7.7)
Neutrophils Relative %: 65 %
Platelet Count: 296 10*3/uL (ref 150–400)
RBC: 4.07 MIL/uL — ABNORMAL LOW (ref 4.22–5.81)
RDW: 15.1 % (ref 11.5–15.5)
WBC Count: 8.9 10*3/uL (ref 4.0–10.5)
nRBC: 0 % (ref 0.0–0.2)

## 2023-12-13 NOTE — Progress Notes (Signed)
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 CT AP on 06/17/22 showed: calcified mesenteric mass with suspected associated small bowel mass; suspicious right hepatic lesion; nodular enhancement along wall of bowel loop in central abdomen.  -abdomen MRI 06/23/22 showed: 3.9 cm mass in right hepatic lobe; small RLQ mesenteric mass or lymphadenopathy. 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 -PET DOTATATE from 08/12/22 showed avid disease within the liver, mesentery and small bowel. 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 He tolerated well with no noticeable side effects.  -- Stopped due to hyperglycemia 08/11/2023 -CT abdomen pelvis with contrast showed no evidence of metastatic or recurrent disease.    Plan: Labs reviewed.  Stable, mild anemia.  Hyperglycemia present. Chromogranin A level is still pending. CT scan from October 2024 showed no evidence of metastases or recurrent disease.  Repeat expected in October 2025. Labs and follow-up should be scheduled for 4 months.  The patient understands the plans discussed today and is in agreement with them.  He knows to contact our office if he develops concerns prior to his next appointment.  I provided 25 minutes of face-to-face time during this encounter and > 50% was spent counseling as documented under my assessment and plan.    Carlean Jews, NP  [facility] - A DEPT OF [facility] [address] [place] [address] Dept:  [phone] Dept Fax: [phone]   No orders of the defined types were placed in this encounter.     CHIEF COMPLAINT:  CC: Metastatic neuroendocrine tumor  Current Treatment: Observation  INTERVAL HISTORY:  Jordan [name] is here today for repeat clinical assessment.  He was last seen by Dr. Mosetta Putt on 08/11/2023.  Most recent CT done 08/05/2023 with stable results.  The plan is to repeat CT in 1 year or sooner if needed based on symptoms.  Patient has been on Sandostatin injections previously and tolerated well.  Currently observation only. He denies chest pain, chest pressure, or shortness of breath. He denies headaches or visual disturbances. He denies abdominal pain, nausea, vomiting, or changes in bowel or bladder habits.   He denies fevers or chills. He denies pain. His appetite is good. His weight has increased 10 pounds over last 1 pound .  I have reviewed the past medical history, past surgical history, social history and family history with the patient and they are unchanged from previous note.  ALLERGIES:  is allergic to amantadines, clonazepam, and zoloft [sertraline].  MEDICATIONS:  Current Outpatient Medications  Medication Sig Dispense Refill   acetaminophen (TYLENOL) 500 MG tablet Take 500-1,000 mg by mouth every 6 (six) hours as needed for moderate pain or mild pain.     atorvastatin (LIPITOR) 40 MG tablet Take 0.5 tablets (20 mg total) by mouth daily. 30 tablet 0   bifidobacterium infantis (ALIGN) capsule Take 1 capsule by mouth daily.     Camphor-Menthol-Methyl Sal (HM SALONPAS PAIN RELIEF EX) Apply 1 patch topically daily as needed (Back pain).     carbidopa-levodopa (SINEMET CR) 50-200 MG tablet Take 1  tablet by mouth at bedtime. 90 tablet 3   carbidopa-levodopa (SINEMET IR) 25-100 MG tablet Take 1.5 tablets by mouth 3 (three) times daily. 405 tablet 3   cholecalciferol (VITAMIN D3) 25 MCG (1000 UNIT) tablet Take 1,000 Units by mouth daily.     clotrimazole-betamethasone  (LOTRISONE) cream Apply 1 Application topically daily as needed (Rash).     docusate sodium (COLACE) 100 MG capsule Take 1 capsule (100 mg total) by mouth 2 (two) times daily. (Patient taking differently: Take 100 mg by mouth daily as needed for mild constipation or moderate constipation. As Needed) 10 capsule 0   feeding supplement, GLUCERNA SHAKE, (GLUCERNA SHAKE) LIQD Take 237 mLs by mouth 2 (two) times daily between meals. Alternate with ensure Diabetic     furosemide (LASIX) 20 MG tablet USE AS NEEDED FOR UP TO 5 DAYS IF SWELLING IN LEGS RECURS. CONTACT CARDIOLOGY IS SWELLING DOES NOT IMPROVE OR GETS WORSE. 15 tablet 0   lidocaine (LIDODERM) 5 % Place 1 patch onto the skin daily as needed. Purchase over the counter. On for 12 hours and off for 12 hours 30 patch 0   magnesium gluconate (MAGONATE) 500 MG tablet Take 0.5 tablets (250 mg total) by mouth at bedtime. 30 tablet 0   Melatonin 10 MG TABS Take 10 mg by mouth at bedtime.     metFORMIN (GLUCOPHAGE) 500 MG tablet Take 1,000 mg by mouth 2 (two) times daily. 1000 mg in the am and 500 mg at night     modafinil (PROVIGIL) 200 MG tablet TAKE 1 TABLET BY MOUTH EVERY DAY 30 tablet 5   Multiple Vitamin (MULTIVITAMIN WITH MINERALS) TABS tablet Take 1 tablet by mouth daily.     NITROSTAT 0.4 MG SL tablet PLACE 1 TABLET (0.4 MG TOTAL) UNDER THE TONGUE EVERY 5 (FIVE) MINUTES AS NEEDED FOR CHEST PAIN. (Patient taking differently: Place 0.4 mg under the tongue every 5 (five) minutes as needed for chest pain.) 25 tablet 5   Omega-3 Fatty Acids (OMEGA 3 PO) Take 1,280 mg by mouth daily.     polyethylene glycol (MIRALAX / GLYCOLAX) 17 g packet Take 17 g by mouth daily. 14 each 0   polyvinyl alcohol (LIQUIFILM TEARS) 1.4 % ophthalmic solution Place 1 drop into both eyes as needed for dry eyes. (Patient taking differently: Place 1 drop into both eyes daily as needed for dry eyes.) 15 mL 0   QUEtiapine (SEROQUEL) 25 MG tablet TAKE 1/2 TABLETS (12.5 MG TOTAL) BY  MOUTH AT BEDTIME. 45 tablet 1   Rivaroxaban (XARELTO) 15 MG TABS tablet Take 1 tablet (15 mg total) by mouth daily. 90 tablet 1   sitaGLIPtin (JANUVIA) 50 MG tablet Take 50 mg by mouth daily.     Vitamin D, Ergocalciferol, (DRISDOL) 1.25 MG (50000 UNIT) CAPS capsule TAKE 1 CAPSULE (50,000 UNITS TOTAL) BY MOUTH EVERY 7 (SEVEN) DAYS 5 capsule 0   No current facility-administered medications for this visit.    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   EXAM: CT ABDOMEN AND PELVIS WITH CONTRAST  IMPRESSION: 1. 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 2. Hepatic lesion as discussed above in the posterior RIGHT hemiliver is suspicious for metastasis in this context. For all above findings in lieu of hepatic MRI, if the patient is not able to undergo hepatic MRI, biochemical correlation and DOTATATE PET may be helpful. 3. Bowel loop in the central abdomen with nodular enhancement approximately 8-9 mm along the wall in terms of greatest wall thickness. 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 4. Hepatic steatosis. 5. Resolution of inflammation about the pancreatic tail with nodular appearance of pancreatic tail. Attention on subsequent imaging is suggested. This may reflect sequela of prior inflammation given improvement but underlying lesion in this location while not  favored is not excluded.  ADDENDUM: For clarification, the central, ileal mesenteric lesion currently measuring 2.1 x 2.1 x 2.4 cm measured 1.6 x 1.8 x 2.1 cm in 2015.   Faintly visible area in the posterior RIGHT hemiliver in 2015 raising the question of possibility of small lesion at best 2 x 2.2 cm in the posterior RIGHT hemiliver this areas now clearly visible measuring 4.1 x 4.0 cm. Findings are compatible with indolent behavior. Indolent carcinoid remains the primary differential consideration. Tumor marker correlation is suggested with DOTATATE PET for further evaluation as warranted.   06/23/2022 Imaging   EXAM: MRI ABDOMEN WITHOUT AND WITH CONTRAST  IMPRESSION: 3.9 cm heterogeneous enhancing mass in segment 7 of the right hepatic lobe, which is nonspecific. Hepatic metastasis cannot be excluded.   Small benign hemangioma and cysts in the posterior right hepatic lobe.   Small right lower quadrant mesenteric mass or lymphadenopathy, without significant change.   Stable 2.1 cm cystic lesion in the pancreatic neck, with other tiny sub-cm cystic foci in the pancreatic head and body. Differential diagnosis includes pancreatic pseudocysts and indolent IPMNs. Recommend continued follow-up by MRI in 6 months. This recommendation follows ACR consensus guidelines: Management of Incidental Pancreatic Cysts: A White Paper of the ACR Incidental Findings Committee. J Am Coll Radiol 2017;14:911-923.   7 mm calculus in the left renal pelvis, with mild left pelvicaliectasis.   07/14/2022 Initial Biopsy   FINAL MICROSCOPIC DIAGNOSIS:   A. LIVER, RIGHT, NEEDLE CORE BIOPSY:  -  Metastatic low-grade neuroendocrine tumor (positive for chromogranin and synaptophysin).  -  Proliferation rate by Ki-67 less than 3%.    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 Approximately 2.8 cm long segment of small bowel loop in the right lower quadrant, posteriorly, which exhibit asymmetric circumferential hyperattenuating mass, which may correspond to patient's known neuroendocrine tumor primary. No proximal bowel obstruction. 2. Redemonstration of a lobulated heterogeneous mass with extensive dystrophic calcifications along the mesentery measuring approximately 1.5 x 2.1 cm, which was markedly FDG avid on the prior exam and is compatible with metastatic disease. No significant interval change in size. 3. Redemonstration of several focal liver lesions, grossly similar to the prior study. The largest lesion in the right hepatic lobe, segment 7 is compatible with patient's known metastasis. No new arterial hyperenhancing lesion seen. 4. Multiple other nonacute observations,  as described above.         REVIEW OF SYSTEMS:   Constitutional: Denies fevers, chills or abnormal weight loss Eyes: Denies blurriness of vision Ears, nose, mouth, throat, and face: Denies mucositis or sore throat Respiratory: Denies cough, dyspnea or wheezes Cardiovascular: Denies palpitation, chest discomfort or lower extremity swelling Gastrointestinal:  Denies nausea, heartburn or change in bowel habits Skin: Denies abnormal skin rashes Lymphatics: Denies new lymphadenopathy or easy bruising Neurological:Denies numbness, tingling or new weaknesses Behavioral/Psych: Mood is stable, no new changes  All other systems were reviewed with the patient and are negative.   VITALS:   Today's Vitals   12/13/23 1313  BP: 124/75  Pulse: 69  Resp: 19  Temp: 97.9 F (36.6 C)  TempSrc: Temporal  SpO2: 96%  Weight: 221 lb 4.8 oz (100.4 kg)   Body mass index is 32.68 kg/m.   Wt Readings from Last 3 Encounters:  12/13/23 221 lb 4.8 oz (100.4 kg)   11/15/23 212 lb 9.6 oz (96.4 kg)  10/11/23 211 lb (95.7 kg)    Body mass index is 32.68 kg/m.  Performance status (ECOG): 1 - Symptomatic but completely ambulatory  PHYSICAL EXAM:   GENERAL:alert, no distress and comfortable SKIN: skin color, texture, turgor are normal, no rashes or significant lesions EYES: normal, Conjunctiva are pink and non-injected, sclera clear OROPHARYNX:no exudate, no erythema and lips, buccal mucosa, and tongue normal  NECK: supple, thyroid normal size, non-tender, without nodularity LYMPH:  no palpable lymphadenopathy in the cervical, axillary or inguinal LUNGS: clear to auscultation and percussion with normal breathing effort HEART: regular rate & rhythm and no murmurs and no lower extremity edema ABDOMEN:abdomen soft, non-tender and normal bowel sounds Musculoskeletal:no cyanosis of digits and no clubbing  NEURO: alert & oriented x 3 with fluent speech, no focal motor/sensory deficits  LABORATORY DATA:  I have reviewed the data as listed    Component Value Date/Time   NA 139 12/13/2023 1221   NA 145 07/23/2021 0000   K 4.2 12/13/2023 1221   CL 103 12/13/2023 1221   CO2 28 12/13/2023 1221   GLUCOSE 248 (H) 12/13/2023 1221   BUN 16 12/13/2023 1221   BUN 14 07/23/2021 0000   CREATININE 1.02 12/13/2023 1221   CREATININE 1.16 03/18/2016 1011   CALCIUM 9.5 12/13/2023 1221   PROT 7.1 12/13/2023 1221   ALBUMIN 3.8 12/13/2023 1221   AST 20 12/13/2023 1221   ALT 5 12/13/2023 1221   ALKPHOS 63 12/13/2023 1221   BILITOT 0.4 12/13/2023 1221   GFRNONAA >60 12/13/2023 1221   GFRAA >90 07/23/2021 0000   Lab Results  Component Value Date   WBC 8.9 12/13/2023   NEUTROABS 5.7 12/13/2023   HGB 10.3 (L) 12/13/2023   HCT 34.0 (L) 12/13/2023   MCV 83.5 12/13/2023   PLT 296 12/13/2023

## 2023-12-13 NOTE — Assessment & Plan Note (Addendum)
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 CT AP on 06/17/22 showed: calcified mesenteric mass with suspected associated small bowel mass; suspicious right hepatic lesion; nodular enhancement along wall of bowel loop in central abdomen.  -abdomen MRI 06/23/22 showed: 3.9 cm mass in right hepatic lobe; small RLQ mesenteric mass or lymphadenopathy. 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 -PET DOTATATE from 08/12/22 showed avid disease within the liver, mesentery and small bowel. 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 He tolerated well with no noticeable side effects.  -- Stopped due to hyperglycemia 08/11/2023 -CT abdomen pelvis with contrast showed no evidence of metastatic or recurrent disease.

## 2023-12-15 LAB — CHROMOGRANIN A: Chromogranin A (ng/mL): 139.9 ng/mL — ABNORMAL HIGH (ref 0.0–101.8)

## 2023-12-18 ENCOUNTER — Encounter: Payer: Self-pay | Admitting: Hematology

## 2023-12-18 ENCOUNTER — Encounter: Payer: Self-pay | Admitting: Nurse Practitioner

## 2023-12-19 ENCOUNTER — Encounter: Payer: Self-pay | Admitting: Nurse Practitioner

## 2023-12-20 ENCOUNTER — Encounter: Payer: Self-pay | Admitting: Hematology

## 2023-12-21 ENCOUNTER — Other Ambulatory Visit: Payer: Self-pay | Admitting: Nurse Practitioner

## 2023-12-21 DIAGNOSIS — C7B8 Other secondary neuroendocrine tumors: Secondary | ICD-10-CM

## 2023-12-22 ENCOUNTER — Emergency Department (HOSPITAL_COMMUNITY)
Admission: EM | Admit: 2023-12-22 | Discharge: 2023-12-23 | Disposition: A | Discharge status: Home / Self Care | Payer: No Typology Code available for payment source | Attending: Emergency Medicine | Admitting: Emergency Medicine

## 2023-12-22 ENCOUNTER — Emergency Department (HOSPITAL_COMMUNITY): Payer: No Typology Code available for payment source

## 2023-12-22 ENCOUNTER — Encounter (HOSPITAL_COMMUNITY): Payer: Self-pay | Admitting: Emergency Medicine

## 2023-12-22 ENCOUNTER — Other Ambulatory Visit: Payer: Self-pay

## 2023-12-22 ENCOUNTER — Encounter: Payer: Self-pay | Admitting: Hematology

## 2023-12-22 DIAGNOSIS — Y900 Blood alcohol level of less than 20 mg/100 ml: Secondary | ICD-10-CM | POA: Diagnosis not present

## 2023-12-22 DIAGNOSIS — I10 Essential (primary) hypertension: Secondary | ICD-10-CM | POA: Diagnosis not present

## 2023-12-22 DIAGNOSIS — G459 Transient cerebral ischemic attack, unspecified: Secondary | ICD-10-CM | POA: Diagnosis not present

## 2023-12-22 DIAGNOSIS — Z7901 Long term (current) use of anticoagulants: Secondary | ICD-10-CM | POA: Diagnosis not present

## 2023-12-22 DIAGNOSIS — E119 Type 2 diabetes mellitus without complications: Secondary | ICD-10-CM | POA: Diagnosis not present

## 2023-12-22 DIAGNOSIS — Z79899 Other long term (current) drug therapy: Secondary | ICD-10-CM | POA: Insufficient documentation

## 2023-12-22 DIAGNOSIS — R4701 Aphasia: Secondary | ICD-10-CM | POA: Diagnosis present

## 2023-12-22 DIAGNOSIS — Z7984 Long term (current) use of oral hypoglycemic drugs: Secondary | ICD-10-CM | POA: Insufficient documentation

## 2023-12-22 LAB — DIFFERENTIAL
Abs Immature Granulocytes: 0.03 10*3/uL (ref 0.00–0.07)
Basophils Absolute: 0.1 10*3/uL (ref 0.0–0.1)
Basophils Relative: 1 %
Eosinophils Absolute: 0.2 10*3/uL (ref 0.0–0.5)
Eosinophils Relative: 3 %
Immature Granulocytes: 0 %
Lymphocytes Relative: 26 %
Lymphs Abs: 2.5 10*3/uL (ref 0.7–4.0)
Monocytes Absolute: 0.9 10*3/uL (ref 0.1–1.0)
Monocytes Relative: 9 %
Neutro Abs: 5.8 10*3/uL (ref 1.7–7.7)
Neutrophils Relative %: 61 %

## 2023-12-22 LAB — I-STAT CHEM 8, ED
BUN: 17 mg/dL (ref 8–23)
Calcium, Ion: 1.23 mmol/L (ref 1.15–1.40)
Chloride: 103 mmol/L (ref 98–111)
Creatinine, Ser: 1 mg/dL (ref 0.61–1.24)
Glucose, Bld: 223 mg/dL — ABNORMAL HIGH (ref 70–99)
HCT: 34 % — ABNORMAL LOW (ref 39.0–52.0)
Hemoglobin: 11.6 g/dL — ABNORMAL LOW (ref 13.0–17.0)
Potassium: 4.3 mmol/L (ref 3.5–5.1)
Sodium: 138 mmol/L (ref 135–145)
TCO2: 24 mmol/L (ref 22–32)

## 2023-12-22 LAB — COMPREHENSIVE METABOLIC PANEL
ALT: 7 U/L (ref 0–44)
AST: 37 U/L (ref 15–41)
Albumin: 3.3 g/dL — ABNORMAL LOW (ref 3.5–5.0)
Alkaline Phosphatase: 50 U/L (ref 38–126)
Anion gap: 10 (ref 5–15)
BUN: 17 mg/dL (ref 8–23)
CO2: 26 mmol/L (ref 22–32)
Calcium: 9.8 mg/dL (ref 8.9–10.3)
Chloride: 103 mmol/L (ref 98–111)
Creatinine, Ser: 1.08 mg/dL (ref 0.61–1.24)
GFR, Estimated: >60 mL/min (ref >60)
Glucose, Bld: 226 mg/dL — ABNORMAL HIGH (ref 70–99)
Potassium: 4.2 mmol/L (ref 3.5–5.1)
Sodium: 139 mmol/L (ref 135–145)
Total Bilirubin: 0.5 mg/dL (ref 0.0–1.2)
Total Protein: 7.4 g/dL (ref 6.5–8.1)

## 2023-12-22 LAB — CBC
HCT: 35.2 % — ABNORMAL LOW (ref 39.0–52.0)
Hemoglobin: 10.7 g/dL — ABNORMAL LOW (ref 13.0–17.0)
MCH: 25.1 pg — ABNORMAL LOW (ref 26.0–34.0)
MCHC: 30.4 g/dL (ref 30.0–36.0)
MCV: 82.6 fL (ref 80.0–100.0)
Platelets: 308 10*3/uL (ref 150–400)
RBC: 4.26 MIL/uL (ref 4.22–5.81)
RDW: 15.3 % (ref 11.5–15.5)
WBC: 9.6 10*3/uL (ref 4.0–10.5)
nRBC: 0 % (ref 0.0–0.2)

## 2023-12-22 LAB — PROTIME-INR
INR: 1.4 — ABNORMAL HIGH (ref 0.8–1.2)
Prothrombin Time: 17 s — ABNORMAL HIGH (ref 11.4–15.2)

## 2023-12-22 LAB — APTT: aPTT: 32 s (ref 24–36)

## 2023-12-22 LAB — ETHANOL: Alcohol, Ethyl (B): <10 mg/dL (ref <10)

## 2023-12-22 MED ORDER — IOHEXOL 350 MG/ML SOLN
75.0000 mL | Freq: Once | INTRAVENOUS | Status: AC | PRN
  Administered 2023-12-22: 75 mL via INTRAVENOUS

## 2023-12-22 NOTE — ED Provider Notes (Signed)
Blood pressure (!) 156/84, pulse 74, temperature 98.1 F (36.7 C), temperature source Oral, resp. rate 18, height 5\' 9"  (1.753 m), weight 100.2 kg, SpO2 99%.  Assuming care from Dr. Jeraldine Loots.  In short, Jordan [name] is a 83 y.o. male with a chief complaint of Aphasia (Slurred Speech) .  Refer to the original H&P for additional details.  The current plan of care is to follow up on CTA and d/c if normal.  12:16 AM  CTA reviewed and without any clinically significant stenosis or blockage.  Discussed with patient and family at bedside.  He will continue his home medications and follow-up with his outpatient neurologist.  Symptoms have resolved and he is feeling well.  Stable for discharge.   Maia Plan, MD 12/23/23 [phone]

## 2023-12-22 NOTE — ED Notes (Signed)
 CCMD contacted for cardiac monitoring

## 2023-12-22 NOTE — ED Triage Notes (Signed)
Pt BIB EMS from home, called out for slurred speech. Wife states it lasted 5-10 minutes then resolved, happened at 20:00. Hx of TIA/Stroke. Denies recent falls, denies weakness during episode. Takes Xarelto.  EMS VS: 180/100 HR 72 99% RA CBG 262

## 2023-12-22 NOTE — ED Provider Notes (Signed)
[place] EMERGENCY DEPARTMENT AT [facility] Provider Note   CSN: [id number] Arrival date & time: 12/22/23  2046     History  Chief Complaint  Patient presents with   Aphasia    Slurred Speech    Jordan [name] is a 83 y.o. male.  HPI Presents via EMS after an episode of aphasia. Patient has a history of multiple medical problems including TIA.  Today, prior to EMS arrival he had approximately 5 to 10 minutes of aphasia, reportedly by wife.  Patient now denies any difficulty speaking, any focal weakness, or any other focal complaints.  EMS reports the patient was mildly hypertensive otherwise hemodynamically unremarkable en route.    Home Medications Prior to Admission medications   Medication Sig Start Date End Date Taking? Authorizing Provider  acetaminophen (TYLENOL) 500 MG tablet Take 500-1,000 mg by mouth every 6 (six) hours as needed for moderate pain or mild pain.    [provider]  atorvastatin (LIPITOR) 40 MG tablet Take 0.5 tablets (20 mg total) by mouth daily. 07/30/21   Medina-Vargas, Monina C, NP  bifidobacterium infantis (ALIGN) capsule Take 1 capsule by mouth daily.    [provider]  Camphor-Menthol-Methyl Sal (HM SALONPAS PAIN RELIEF EX) Apply 1 patch topically daily as needed (Back pain).    [provider]  carbidopa-levodopa (SINEMET CR) 50-200 MG tablet Take 1 tablet by mouth at bedtime. 08/17/23   Ihor Austin, NP  carbidopa-levodopa (SINEMET IR) 25-100 MG tablet Take 1.5 tablets by mouth 3 (three) times daily. 08/17/23   Ihor Austin, NP  cholecalciferol (VITAMIN D3) 25 MCG (1000 UNIT) tablet Take 1,000 Units by mouth daily.    [provider]  clotrimazole-betamethasone (LOTRISONE) cream Apply 1 Application topically daily as needed (Rash). 06/21/22   [provider]  docusate sodium (COLACE) 100 MG capsule Take 1 capsule (100 mg total) by mouth 2 (two) times daily. Patient taking differently:  Take 100 mg by mouth daily as needed for mild constipation or moderate constipation. As Needed 02/17/22   Kathlen Mody, MD  feeding supplement, GLUCERNA SHAKE, (GLUCERNA SHAKE) LIQD Take 237 mLs by mouth 2 (two) times daily between meals. Alternate with ensure Diabetic    [provider]  furosemide (LASIX) 20 MG tablet USE AS NEEDED FOR UP TO 5 DAYS IF SWELLING IN LEGS RECURS. CONTACT CARDIOLOGY IS SWELLING DOES NOT IMPROVE OR GETS WORSE. 01/26/22   Raulkar, Drema Pry, MD  lidocaine (LIDODERM) 5 % Place 1 patch onto the skin daily as needed. Purchase over the counter. On for 12 hours and off for 12 hours 01/18/22   Love, Evlyn Kanner, PA-C  magnesium gluconate (MAGONATE) 500 MG tablet Take 0.5 tablets (250 mg total) by mouth at bedtime. 03/24/22   Raulkar, Drema Pry, MD  Melatonin 10 MG TABS Take 10 mg by mouth at bedtime.    [provider]  metFORMIN (GLUCOPHAGE) 500 MG tablet Take 1,000 mg by mouth 2 (two) times daily. 1000 mg in the am and 500 mg at night    [provider]  modafinil (PROVIGIL) 200 MG tablet TAKE 1 TABLET BY MOUTH EVERY DAY 12/12/23   Waymon Budge, MD  Multiple Vitamin (MULTIVITAMIN WITH MINERALS) TABS tablet Take 1 tablet by mouth daily. 02/19/18   Alwyn Ren, MD  NITROSTAT 0.4 MG SL tablet PLACE 1 TABLET (0.4 MG TOTAL) UNDER THE TONGUE EVERY 5 (FIVE) MINUTES AS NEEDED FOR CHEST PAIN. Patient taking differently: Place 0.4 mg under the  tongue every 5 (five) minutes as needed for chest pain. 03/31/16   Lyn Records, MD  Omega-3 Fatty Acids (OMEGA 3 PO) Take 1,280 mg by mouth daily.    [provider]  polyethylene glycol (MIRALAX / GLYCOLAX) 17 g packet Take 17 g by mouth daily. 01/09/22   Lanae Boast, MD  polyvinyl alcohol (LIQUIFILM TEARS) 1.4 % ophthalmic solution Place 1 drop into both eyes as needed for dry eyes. Patient taking differently: Place 1 drop into both eyes daily as needed for dry eyes. 01/18/22   Love, Evlyn Kanner, PA-C   QUEtiapine (SEROQUEL) 25 MG tablet TAKE 1/2 TABLETS (12.5 MG TOTAL) BY MOUTH AT BEDTIME. 06/09/23   Raulkar, Drema Pry, MD  Rivaroxaban (XARELTO) 15 MG TABS tablet Take 1 tablet (15 mg total) by mouth daily. 08/11/23   Malachy Mood, MD  sitaGLIPtin (JANUVIA) 50 MG tablet Take 50 mg by mouth daily.    [provider]  Vitamin D, Ergocalciferol, (DRISDOL) 1.25 MG (50000 UNIT) CAPS capsule TAKE 1 CAPSULE (50,000 UNITS TOTAL) BY MOUTH EVERY 7 (SEVEN) DAYS 05/06/22   Raulkar, Drema Pry, MD      Allergies    Amantadines, Clonazepam, and Zoloft [sertraline]    Review of Systems   Review of Systems  Physical Exam Updated Vital Signs BP (!) 156/84 (BP Location: Right Arm)   Pulse 74   Temp 98.1 F (36.7 C) (Oral)   Resp 18   Ht 5\' 9"  (1.753 m)   Wt 100.2 kg   SpO2 99%   BMI 32.64 kg/m  Physical Exam Vitals and nursing note reviewed.  Constitutional:      General: He is not in acute distress.    Appearance: He is well-developed.  HENT:     Head: Normocephalic and atraumatic.  Eyes:     Conjunctiva/sclera: Conjunctivae normal.  Cardiovascular:     Rate and Rhythm: Normal rate and regular rhythm.  Pulmonary:     Effort: Pulmonary effort is normal. No respiratory distress.     Breath sounds: No stridor.  Abdominal:     General: There is no distension.  Skin:    General: Skin is warm and dry.  Neurological:     Mental Status: He is alert and oriented to person, place, and time.     Motor: Tremor and atrophy present. No abnormal muscle tone.     ED Results / Procedures / Treatments   Labs (all labs ordered are listed, but only abnormal results are displayed) Labs Reviewed  PROTIME-INR  APTT  CBC  DIFFERENTIAL  COMPREHENSIVE METABOLIC PANEL  ETHANOL  I-STAT CHEM 8, ED    EKG None  Radiology No results found.  Procedures Procedures    Medications Ordered in ED Medications - No data to display  ED Course/ Medical Decision Making/ A&P                                  Medical Decision Making Patient with history of prior TIA, Parkinson's, now presents after an episode of aphasia that resolved.  Given his history, TIA initial concern, though with anticoagulation, spontaneous bleed is initial consideration. Lower suspicion for infectious or other metabolic etiology. Cardiac 75 paced rhythm abnormal Pulse ox 100% room air normal  Amount and/or Complexity of Data Reviewed Independent Historian: EMS External Data Reviewed: notes. Labs: ordered. Decision-making details documented in ED Course. Radiology: ordered and independent interpretation performed. Decision-making details documented  in ED Course. ECG/medicine tests: ordered and independent interpretation performed. Decision-making details documented in ED Course.  Risk Prescription drug management. Decision regarding hospitalization. Diagnosis or treatment significantly limited by social determinants of health.  Update: Patient accompanied by his wife.  I reviewed all findings thus far with her. 11:11 PM Patient remains in no distress, no speech changes, though he has been sleeping on repeat exam.  After reviewed his presentation with neurology team, given the patient's current use of anticoagulation, unremarkable head CT, patient will have CT angiography.  If this is unremarkable patient can follow-up with his neurologist, abnormal CTA, patient will be reevaluated, with consideration of admission.  Other findings thus far reassuring, EKG consistent with prior, labs unremarkable, vital signs unremarkable.  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  On sign-out patient awaiting study, re-eval.  Final Clinical Impression(s) / ED Diagnoses Final diagnoses:  TIA (transient ischemic attack)     Gerhard Munch, MD 12/22/23 2313

## 2023-12-23 NOTE — ED Notes (Signed)
 Patient verbalizes understanding of discharge instructions. Opportunity for questioning and answers were provided. Armband removed by staff, pt discharged from ED. Pt taken to ED entrance via wheel chair.

## 2023-12-27 ENCOUNTER — Telehealth: Payer: Self-pay | Admitting: Adult Health

## 2023-12-27 NOTE — Telephone Encounter (Signed)
 Pt scheduled for 12/29/23 at 12:45pm with Shanda Bumps

## 2023-12-27 NOTE — Telephone Encounter (Signed)
 Pt's wife, Ernesto Zukowski calling to schedule an ER follow up from having a TIA. Would like an appointment as soon as possible.  Pt's wife refused the 2/27 at 8:15 am

## 2023-12-28 ENCOUNTER — Telehealth: Payer: Self-pay | Admitting: Nurse Practitioner

## 2023-12-28 NOTE — Progress Notes (Unsigned)
 PATIENT: Jordan [name] DOB: [birth year]  REASON FOR VISIT: vascular parkinsonism f/u HISTORY FROM: patient and wife PRIMARY NEUROLOGIST: Jordan [name]   No chief complaint on file.    HISTORY OF PRESENT ILLNESS:   Update 12/29/2023 Jordan [name]: Patient returns for sooner scheduled visit for hospital follow-up.  He presented to [facility] ED on 2/20 with transient aphasia lasting 5 to 10 minutes. CTH no acute abnormalities. CTA negative LVO.  Unable to obtain MRI due to pacer.  No medication changes recommended.  Diagnosed with TIA.  Patient was discharged after 3 hours.        History provided for reference purposes only Update 08/17/2023 Jordan [name]: Patient returns for 81-month follow-up accompanied by his wife. Overall stable since prior visit. Denies any progression of symptoms. Continues on Sinemet 1.5 tabs TID (first dose around 9:30-10am and then every 4-5 hours) and Sinemet CR 1 tab at bedtime, denies side effects. Will have worsening symptoms if he goes past that 4-5 hour mark. Gait is shuffled, ambulates with RW short distance, w/c long distance, no recent falls. Completed therapies back in July, recommended 3 mo f/u for PD re-evaluation but unable to be scheduled for return visit until end of Jan. Does have issues with constipation, will use colax and/or miralax as needed, does drink some fluids during the day but does not drink straight water, reports this gives him heart burn. Has difficulty swallowing large pills, takes with applesauce, avoids chewier foods more due to dental reasons, denies any worsening of swallowing.  No further questions or concerns at this time.  Update 02/15/2023 Jordan [name]: Jordan [name] is a 83 y.o. male who has been followed in this office for vascular parkinsons. Returns today for follow-up. Here today with his wife. Reports that this AM he felt weak in the lower legs. Does not drink much water. Issues with constipation- doesn't want to take a stool softner daily d/t fear of  having an accident. Some trouble swallowing but not consistent. Some trouble with pills but now uses applesauce and that works better. Uses a walker at home and wheelchair when out and about.  Reports that he was having vivid dreams but Seroquel has helped with that.  He typically goes to bed around 11 PM and wakes up around 11 AM.  His first dose of Sinemet is around 10:30 AM, 230 and 630.  He takes Sinemet CR around 9 PM.  Wife knows that he has some trouble with leg weakness in the late afternoons.  She is wondering if Sinemet IR should be increased  Update 11/23/2022 Jordan [name]: He returns for follow-up after last visit 3 months ago.  He is accompanied by his wife.  They have noticed slight improvement after increasing the Sinemet dose to 25/100 to 1 and half tablets 3 times daily.  He has noticed improvement in his mobility and does not feel as weak or fatigued.  He denies any increased side effects in the form of nausea, dizziness but does have mild sleepiness.  He denies any delusions or hallucinations.  Still needs help with many activities of daily living but he can feed himself and clean himself In the toilet.  He complains of fatigability and he does use his CPAP regularly but has a appointment coming up to see Dr. Maple Hudson denies any recurrent symptoms of stroke TIA.  His blood pressure tends to be on the low side and he denies he is short distances.  He does agree to some slight increase in  drooling particularly in the morning.  He remains on Xarelto for his DVT and is tolerating it well without bruising or bleeding.  Sleep well most of the night.  Update 08/19/22 Jordan [name]: He returns for follow-up after last visit with Jordan [name] 1 month ago.  He is accompanied by his wife.  She states that for the last week or so she has noticed worsening of his parkinsonian symptoms.  He appears extremely fatigued and weak.  He has trouble walking down the hallway from the bedroom to the living room  and has to stop.  At times he has difficult time getting started with any walks.  Walks with increased shuffling and small steps.  He has mild intermittent tremor but it is not as bothersome.  Patient had been on amantadine 200 mg daily but he was having some hallucinations so this was discontinued a week or 2 ago and may have contributed to his worsening.  He is currently on Sinemet IR 25/101 tablet 3 times daily and Sinemet CR 25/101 tablet at bedtime.  He seems to be tolerating these medications fairly well without significant dizziness, sleepiness or other side effects.  He has had no recent stroke or TIA symptoms.  He remains on Xarelto for DVT treatment.  Update 07/13/2022 Jordan [name]: Patient returns for follow up visit accompanied by his wife   He has been relatively stable from neurological standpoint over the past few months.  He has remained on Sinemet 1 tab 3 times daily and Sinemet CR 1 tab nightly. Tolerating without side effects. Does have some worsening stiffness and slowness pursing in the morning and in the evening/bedtime. Can have occasional difficulty with right leg hesitancy/coordination especially in the evening. Use of RW for short distance ambulation otherwise use of w/c.  Recently completed outpatient therapies.  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  He does try to maintain some ADLs independently but does need some assistance by his wife.  He does fatigue quickly after some ADLs such as bathing or dressing.  Previously having issues with visual hallucinations but these have greatly subsided since starting Seroquel 12.5 mg nightly, does still have some occasional visual hallucinations but nonthreatening.  Does have frequent talking and acting out (punching and kicking) while sleeping.  He is on melatonin 10 mg nightly.  Admits to limited daytime physical activity, he usually sits and watches TV during the day, can fall asleep easily in chair unless watching something that keeps his interest.  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 Maple Hudson back in March.  He does endorse nightly use of CPAP.   He has had multiple hospitalizations since prior visit   Hospitalized 12/2021 for frequent falls and hallucinations, started on Seroquel at that time per psychiatry recommendations and felt hallucinations in setting of vascular parkinsonism.  He was discharged to SNF for deconditioning.   He underwent kyphoplasty on 2/28 for lumbar compression fracture by Dr. Corliss Skains   hospitalized again 3/1 for generalized abdominal pain and frequent falls, discharged to CIR on 3/10. During CIR stay, he was started on amantadine to help with activation and remained on low-dose Seroquel to help with sleep and decrease nighttime hallucinations.   Suffered a closed left hip fracture post fall 01/2022 and underwent surgical repair.  He was discharged to SNF rehab and returned home on 6/8.   Diagnosed with left common femoral vein, SF  junction, femoral vein, proximal profunda vein, popliteal vein and posterior tibial vein DVT on 6/28. Placed on Xarelto with outpatient vascular surgery follow-up. Had issues with hematuria, improved after lowering dose to therapeutic dose but still having issues.  Recently transition to Lovenox for upcoming liver biopsy, wife reports resolution of discolored urine since stopping Xarelto.  Plans on pursuing cystoscopy in the near future.  Update 12/08/2021 Jordan [name]: Returns for follow-up visit after prior visit 3 months ago for concern of worsening gait and functioning. Hx of prior stroke and parkinsonism. At prior visit, adjusted Sinemet dosage - currently on 25-100 1 tab 3x daily and ER 25-100 nightly. Per patient and wife, gait has remained the same - denies worsening but also denies any improvement. Tolerating Sinemet without side effects but no noticeable difference since dose  change 3 months ago. Recently seen by Dr. Ethelene Hal for lower back issues and plans to start PT for primarily lumbar spine on 3/2. Continues to use RW but limited ambulate and only short distance. Does c/o neck and shoulder pain which is chronic but gradually worsening. Limited neck ROM. Denies radiculopathy. No prior cervical imaging. At times, will have difficulty holding head up and sits in a hunched over position. He will have difficulty holding on to small, especially lighter objects, right > left since stroke. C/o shortness of breath after talking for prolonged periods of time which has been present over the past 4-5 months. Denies SOB on exertion but wife believes that he does. Wife also notes voice being softer. Denies any swallowing difficulties. Does have chronic diabetic neuropathy with some worsening over the past 6 months. Recent A1c satisfactory. BUE tremors stable. He also c/o continued fatigue.    Stable from stroke standpoint without new stroke/TIA symptoms. Compliant on Plavix and atorvastatin without side effects.  Blood pressure today 112/75. Recent lipid panel satisfactory.    No further concerns at this time     Update 09/14/2021 Jordan [name]: Returns for sooner scheduled visit accompanied by his wife.  Concern regarding worsening gait and functioning since prior visit. He did have a fall on 8/23 with subsequent left acetabular fracture - ortho eval recommended nonoperative management.  Admitted to SNF rehab and discharged on 9/23. Reports since he returned home from rehab, gait has gradually worsened (increased shuffling and decreased step height) and has been requiring more assistance.  Working with [facility] PT initially 2x weekly now only 1x per week. Suffered a fall 10/29. Eval at [facility] walk-in clinic with xray showing possible compression fracture per wife report (unable to personally view via epic).  He is currently awaiting eval by orthopedics. He has had f/u with ortho for hip fx since returning  home and f/u visit scheduled end of this month. Per wife, currently limited ambulation and exercise - will only walk to go to the bathroom. PT currently on hold until further eval with our office and ortho.    Wife apparently increased Sinemet to 3 times daily (currently taking at 10am, 4pm and 10pm) approx 1 month ago. Per wife, slight improvement of gait and functioning since increase during the day but does have worsening gait and stiffness upon awakening. Follows with Dr. 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 Also reports hallucinations during SNF rehab stay - this has since improved.  Wife questions if Sinemet dosage needs to be adjusted   Eval by Dr. Frances Furbish for second opinion of gait disorder and wife concern of  Parkinson's disease. 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      Update 05/12/2021 Jordan [name]; He returns for follow-up after last visit with Jordan [name] in April 2022.  Is accompanied by his wife.  Patient has had no recurrent stroke or TIA symptoms.  He continues to have mild dizziness and imbalance in the right side paresthesias and diminished fine motor skills.  He occasionally has tingling of his fingers or his feet but these are not bothersome.  He is quite active and works out 3 days a week at [employer].  He remains on Plavix which is tolerating well without bruising or bleeding.  He takes Lipitor 20 mg daily and is tolerating it well.  He states his primary care physician did check his lipid profile and A1c about 5 weeks ago but I do not have those results and he was told they were satisfactory.  He does have chronic low back pain and also complains of fatigue and tiredness.  He has not discussed this with primary physician had any work-up done for this.  Patient has  history of parkinsonian tremor and has a prescription for Sinemet 25/103 times daily but states that he gets it only once a day or so as he forgets to take it and other times.  Tremor seems intermittent and not constant and is not functionally disabling.  Wife feels that he does have a shuffling gait particularly when he is tired.  He however denies significant bradykinesia and is able to get up and walk by himself easily without assistance.  He denies drooling of saliva or micrographia.  He has not had any particular worsening of his tremors or other parkinsonian symptoms in recent months.     Update 02/19/2021 Jordan [name]: Jordan [name] returns for acute visit per wife request due to recent onset of increased fatigue   Known hx of daytime fatigue with known OSA on CPAP and narcolepsy. Per wife, over the past 3 to 4 days, he has been experiencing increased daytime sleepiness where he is sleeping majority of the day as well as sleeping well at night.  Reports PCP recently changed Adderall IR to XR for narcolepsy but will occasionally miss a dose.  Also reports recently being started on sertraline 50 mg daily on 4/14 which he will take in the morning.  He also recently started PT on Monday for gait impairment and BPPV.  No other associated symptoms such as confusion, worsening cognition, weakness, numbness/tingling, speech deficit, visual changes or gait changes.  No further concerns at this time     Update 11/27/2020 Jordan [name]: Jordan [name] returns per patient request for sooner scheduled visit due to continued gait impairment and lower back pain.  He is currently unaccompanied.  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  Tremors have been stable without worsening.  He does feel as though his ambulation is worse with increased back pain. He did have a mechanical fall seen in ED on 11/05/2020 thankfully without injury.   He does have chronic back pain previously doing routine  exercise prior to his stroke but has not returned back to routine exercise since. He admits to sedentary life style with limited physical activity. He will fall asleep easily during the day watching TV and occasional issues with insomnia. He will take adderral 10mg  daily for narcolepsy and will take  additional tablet as needed during the day.  No further concerns at this time.   Update 09/15/2020 Jordan [name]: Jordan [name] returns for stroke follow-up accompanied by his wife.  He has been doing well since prior visit with residual decreased right hand dexterity and decreased sensory right fingertips.  Denies new or worsening stroke/TIA symptoms. He does complain of short-term memory loss such as forgetting an actors name or random fact.  He also continues to experience R>L upper extremity tremors and gait impairment with imbalance with fall in 06/2020 resulting in nasal bone fracture.  Wife reports shuffling type gait when fatigued.  Remains on Plavix and atorvastatin for secondary stroke prevention without side effects.  Blood pressure today 126/73.  Glucose level stable per patient.  Reports ongoing compliance with CPAP for OSA management managed by pulmonology Dr. Maple Hudson.  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  He complains of excessive daytime fatigue, vivid dreams and occasional insomnia. Per patient, he has not trialed any other medication for narcolepsy.  No further concerns at this time.   Update 05/15/2020 Jordan [name]: Jordan [name] is being seen for hospital follow-up accompanied by his wife.  He was discharged home from CIR on 04/29/2020 after 11 day stay.  Residual deficits of mild imbalance and mildly decreased sensation R finger tips and decreased right hand fine motor skills. Does report ongoing improvement currently working with home health PT. he has had gait impairment ongoing and right hand decreased fine motor control with tremors and was evaluated by Dr. Frances Furbish on 12/11/2019  for possible Parkinson's but was not felt this was Parkinson's related.  He does continue to use cane for ambulation and denies any recent falls.  He is questioning return to driving as he was advised no driving at discharge until further cleared.  Completed 3 weeks DAPT and continues on Plavix alone without bleeding or bruising.  Continues on atorvastatin 40 mg daily without myalgias.  Blood pressure today 118/84.  Glucose levels stable.  Endorses ongoing use of CPAP for OSA management.  No further concerns at this time.    Stroke admission 04/14/2020 Jordan [name] is a 83 y.o. male with history of  diabetes, stroke, hypertension, hyperlipidemia, permanent cardiac pacemaker  who presented on 04/14/2020 with R sided decreased sensation.  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  Previously on clopidogrel and recommended DAPT for 3 weeks then clopidogrel alone.  Presented in hypertensive urgency with BP 204/116 and recommended long-term BP goal normotensive range.  LDL 57 and recommended continuation of atorvastatin 40 mg daily.  Uncontrolled DM with A1c 7.9.  Other stroke risk factors include prior stroke 01/2018 with multifocal punctate infarcts concerning for embolic pattern, former tobacco use, EtOH use, obesity, CAD, OSA on CPAP and permanent cardiac pacemaker.  Evaluated by therapies who recommended CIR for residual deficits of imbalance and safety affecting ADLs and mobility.  Discharged to CIR on 04/18/2020.   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 Small vessel disease. Sinus dz. ASPECTS 10.    CTA head & neck no LVO. Aortic atherosclerosis.  MRI  L thalamic infarct. Small vessel disease. Atrophy.  2D Echo EF 65-70%. No source of embolus. LA mildly dilated.  Pacemaker interrogation no afib LDL 57 -continued atorvastatin 40 mg daily HgbA1c 7.9 Lovenox 40 mg sq daily for VTE  prophylaxis clopidogrel 75 mg daily prior  to admission, now on aspirin 81 mg daily and clopidogrel 75 mg daily. Continue DAPT x 3 weeks then plavix alone   Therapy recommendations:  CIR Disposition:  CIR  REVIEW OF SYSTEMS: Out of a complete 14 system review of symptoms, the patient complains only of the following symptoms, and all other reviewed systems are negative.  ALLERGIES: Allergies  Allergen Reactions   Amantadines Other (See Comments)    Mental changes   Clonazepam Other (See Comments)    Mental changes   Zoloft [Sertraline] Other (See Comments)    Mental changes    HOME MEDICATIONS: Outpatient Medications Prior to Visit  Medication Sig Dispense Refill   acetaminophen (TYLENOL) 500 MG tablet Take 500-1,000 mg by mouth every 6 (six) hours as needed for moderate pain or mild pain.     atorvastatin (LIPITOR) 40 MG tablet Take 0.5 tablets (20 mg total) by mouth daily. 30 tablet 0   bifidobacterium infantis (ALIGN) capsule Take 1 capsule by mouth daily.     Camphor-Menthol-Methyl Sal (HM SALONPAS PAIN RELIEF EX) Apply 1 patch topically daily as needed (Back pain).     carbidopa-levodopa (SINEMET CR) 50-200 MG tablet Take 1 tablet by mouth at bedtime. 90 tablet 3   carbidopa-levodopa (SINEMET IR) 25-100 MG tablet Take 1.5 tablets by mouth 3 (three) times daily. 405 tablet 3   cholecalciferol (VITAMIN D3) 25 MCG (1000 UNIT) tablet Take 1,000 Units by mouth daily.     clotrimazole-betamethasone (LOTRISONE) cream Apply 1 Application topically daily as needed (Rash).     docusate sodium (COLACE) 100 MG capsule Take 1 capsule (100 mg total) by mouth 2 (two) times daily. (Patient taking differently: Take 100 mg by mouth daily as needed for mild constipation or moderate constipation. As Needed) 10 capsule 0   feeding supplement, GLUCERNA SHAKE, (GLUCERNA SHAKE) LIQD Take 237 mLs by mouth 2 (two) times daily between meals. Alternate with ensure Diabetic     furosemide (LASIX) 20 MG tablet  USE AS NEEDED FOR UP TO 5 DAYS IF SWELLING IN LEGS RECURS. CONTACT CARDIOLOGY IS SWELLING DOES NOT IMPROVE OR GETS WORSE. 15 tablet 0   lidocaine (LIDODERM) 5 % Place 1 patch onto the skin daily as needed. Purchase over the counter. On for 12 hours and off for 12 hours 30 patch 0   magnesium gluconate (MAGONATE) 500 MG tablet Take 0.5 tablets (250 mg total) by mouth at bedtime. 30 tablet 0   Melatonin 10 MG TABS Take 10 mg by mouth at bedtime.     metFORMIN (GLUCOPHAGE) 500 MG tablet Take 1,000 mg by mouth 2 (two) times daily. 1000 mg in the am and 500 mg at night     modafinil (PROVIGIL) 200 MG tablet TAKE 1 TABLET BY MOUTH EVERY DAY 30 tablet 5   Multiple Vitamin (MULTIVITAMIN WITH MINERALS) TABS tablet Take 1 tablet by mouth daily.     NITROSTAT 0.4 MG SL tablet PLACE 1 TABLET (0.4 MG TOTAL) UNDER THE TONGUE EVERY 5 (FIVE) MINUTES AS NEEDED FOR CHEST PAIN. (Patient taking differently: Place 0.4 mg under the tongue every 5 (five) minutes as needed for chest pain.) 25 tablet 5   Omega-3 Fatty Acids (OMEGA 3 PO) Take 1,280 mg by mouth daily.     polyethylene glycol (MIRALAX / GLYCOLAX) 17 g packet Take 17 g by mouth daily. 14 each 0   polyvinyl alcohol (LIQUIFILM TEARS) 1.4 % ophthalmic solution Place 1 drop into both eyes as needed for dry eyes. (Patient taking differently: Place  1 drop into both eyes daily as needed for dry eyes.) 15 mL 0   QUEtiapine (SEROQUEL) 25 MG tablet TAKE 1/2 TABLETS (12.5 MG TOTAL) BY MOUTH AT BEDTIME. 45 tablet 1   Rivaroxaban (XARELTO) 15 MG TABS tablet Take 1 tablet (15 mg total) by mouth daily. 90 tablet 1   sitaGLIPtin (JANUVIA) 50 MG tablet Take 50 mg by mouth daily.     Vitamin D, Ergocalciferol, (DRISDOL) 1.25 MG (50000 UNIT) CAPS capsule TAKE 1 CAPSULE (50,000 UNITS TOTAL) BY MOUTH EVERY 7 (SEVEN) DAYS 5 capsule 0   No facility-administered medications prior to visit.    PAST MEDICAL HISTORY: Past Medical History:  Diagnosis Date   Arthritis    R shoulder,  bone spur   Arthritis    "hands" (03/25/2016)   Chronic diastolic heart failure (HCC)    Chronic lower back pain    Coronary heart disease    Dr Garnette Scheuermann   Diverticulitis 12/2021   DVT (deep venous thrombosis) (HCC) 05/2022   left leg   H/O cardiovascular stress test    perhaps last one was 2009   Heart murmur    12/29/11 echo: mild MR, no AS, trivial TR   History of gout    Hyperlipidemia    Hypertension    saw Dr. Mendel Ryder last earl;y- 2014, cardiac cath. last done ?2009, blocks seen didn't require any intervention at that point.   Narcolepsy    [name] 04-11-97; Mean Latency 1.63min, SOREM 2   OSA on CPAP    NPSG 12-13-98 AHI 22.7   PONV (postoperative nausea and vomiting)    Presence of permanent cardiac pacemaker    Shortness of breath    with exertion    Stroke (HCC)    Type II diabetes mellitus (HCC)     PAST SURGICAL HISTORY: Past Surgical History:  Procedure Laterality Date   CARDIAC CATHETERIZATION  2009?   EP IMPLANTABLE DEVICE N/A 03/25/2016   Procedure: Pacemaker Implant - Dual Chamber;  Surgeon: Marinus Maw, MD;  Location: [facility] INVASIVE CV LAB;  Service: Cardiovascular;  Laterality: N/A;   FRACTURE SURGERY     HIP FRACTURE SURGERY     INSERT / REPLACE / REMOVE PACEMAKER  03/24/2016   INTRAMEDULLARY (IM) NAIL INTERTROCHANTERIC Left 02/08/2022   Procedure: INTRAMEDULLARY (IM) NAIL INTERTROCHANTRIC;  Surgeon: Cammy Copa, MD;  Location: [facility] OR;  Service: Orthopedics;  Laterality: Left;   IR KYPHO LUMBAR INC FX REDUCE BONE BX UNI/BIL CANNULATION INC/IMAGING  12/29/2021   IR RADIOLOGIST EVAL & MGMT  12/15/2021   IR RADIOLOGY PERIPHERAL GUIDED IV START  07/14/2022   IR US GUIDE VASC ACCESS RIGHT  07/15/2022   LEFT AND RIGHT HEART CATHETERIZATION WITH CORONARY ANGIOGRAM N/A 06/05/2014   Procedure: LEFT AND RIGHT HEART CATHETERIZATION WITH CORONARY ANGIOGRAM;  Surgeon: Lesleigh Noe, MD;  Location[facility] CATH LAB;  Service: Cardiovascular;  Laterality: N/A;    NASAL FRACTURE SURGERY     SHOULDER ARTHROSCOPY WITH ROTATOR CUFF REPAIR AND SUBACROMIAL DECOMPRESSION Right 08/16/2013   Procedure: SHOULDER ARTHROSCOPY WITH ROTATOR CUFF REPAIR AND SUBACROMIAL DECOMPRESSION;  Surgeon: Mable Paris, MD;  Location: [facility] OR;  Service: Orthopedics;  Laterality: Right;  Right shoulder arthroscopy rotator cuff repair, subacromial decompression.   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[employer]   Occupation: [place] [employer]  Tobacco Use   Smoking status: Former    Current packs/day: 0.00    Average packs/day: 2.5 packs/day for 6.0 years (15.0 ttl pk-yrs)    Types: Cigarettes    Start date: 06/04/1961    Quit date: 06/05/1967    Years since quitting: 56.6   Smokeless tobacco: Never  Vaping Use   Vaping status: Never Used  Substance and Sexual Activity   Alcohol use: Yes    Comment: rare   Drug use: No   Sexual activity: Not Currently  Other Topics Concern   Not on file  Social History Narrative   Lives with wife and son   Right handed   Drinks 1 cups caffeine daily   Social Drivers of Corporate investment banker Strain: Not on file  Food Insecurity: Not on file  Transportation Needs: Not on file  Physical Activity: Not on file  Stress: Not on file  Social Connections: Not on file  Intimate Partner Violence: Not on file      PHYSICAL EXAM  There were no vitals filed for this visit.   There is no height or weight on file to calculate BMI.  Generalized: Well developed, very pleasant elderly Caucasian male, in no acute distress   Neurological examination  Mentation: Alert  oriented to time, place, history taking. Follows all commands speech and language fluent. Mild to mod hypophonia.  Decreased facial expression. Cranial nerve II-XII: Pupils were equal round reactive to light. Extraocular movements were full, visual field were full on confrontational test. Facial sensation and strength were normal. Uvula tongue midline. Head turning and shoulder shrug  were normal and symmetric. Motor: The motor testing reveals 5 over 5 strength of all 4 extremities.  Unable to appreciate resting tremor.  Slight cogwheel rigidity upon activation R>L.  Finger taps and toe taps mildly impaired Sensory: Sensory testing is intact to soft touch on all 4 extremities. No evidence of extinction is noted.  Gait and station: Deferred     DIAGNOSTIC DATA (LABS, IMAGING, TESTING) - I reviewed patient records, labs, notes, testing and imaging myself where available.  Lab Results  Component Value Date   WBC 9.6 12/22/2023   HGB 11.6 (L) 12/22/2023   HCT 34.0 (L) 12/22/2023   MCV 82.6 12/22/2023   PLT 308 12/22/2023      Component Value Date/Time   NA 138 12/22/2023 2124   NA 145 07/23/2021 0000   K 4.3 12/22/2023 2124   CL 103 12/22/2023 2124   CO2 26 12/22/2023 2105   GLUCOSE 223 (H) 12/22/2023 2124   BUN 17 12/22/2023 2124   BUN 14 07/23/2021 0000   CREATININE 1.00 12/22/2023 2124   CREATININE 1.02 12/13/2023 1221   CREATININE 1.16 03/18/2016 1011   CALCIUM 9.8 12/22/2023 2105   PROT 7.4 12/22/2023 2105   ALBUMIN 3.3 (L) 12/22/2023 2105   AST 37 12/22/2023 2105   AST 20 12/13/2023 1221   ALT 7 12/22/2023 2105   ALT 5 12/13/2023 1221   ALKPHOS 50 12/22/2023 2105   BILITOT 0.5 12/22/2023 2105   BILITOT 0.4 12/13/2023 1221   GFRNONAA >60 12/22/2023 2105   GFRNONAA >60 12/13/2023 1221   GFRAA >90 07/23/2021 0000   Lab Results  Component Value Date   CHOL 123 04/29/2022   HDL 44 04/29/2022   LDLCALC 51 04/29/2022   TRIG 141 04/29/2022   CHOLHDL 2.8 04/29/2022    Lab Results  Component Value Date   HGBA1C 8.4 (H) 04/29/2022   Lab Results  Component Value Date   VITAMINB12 695 12/08/2021   Lab Results  Component Value Date   TSH 1.030 12/19/2021      ASSESSMENT AND PLAN LARSEN DUNGAN is a 83 y.o. 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  S/p left acetabular fracture 06/23/2021 after a mechanical fall with worsening gait and increased need for assistance since return home from [facility] rehab and recurrent fall with resultant left hip fracture 01/2022 requiring surgical procedure.  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 He also has vascular parkinsonism which has shown some response to Sinemet     1.  Vascular parkinsonism -Continue Sinemet IR 25-100 mg 1.5 tablets 3 times a day every 4-5 hours -Continue Sinemet CR 1 tab at bedtime -Encouraged the patient to stay well-hydrated with water -restart therapies once able, advised to call if they wish to be seen by [facility] d/t limited scheduling availability at main location -continued use of RW at all times -continue colace and miralax as needed for constipation     Follow-up in 6 months or call earlier if needed    I spent 30 minutes of face-to-face and non-face-to-face time with patient and wife.  This included previsit chart review, lab review, study review, order entry, electronic health record documentation, patient and wife education and discussion regarding above diagnoses and treatment plan and answered all other questions to patient and wife's satisfaction  Ihor Austin, AGNP-BC  [facility] [address] [place], [place] [address]  Phone [phone] Fax [phone] Note: This document was prepared with  digital dictation and possible smart phrase technology. Any transcriptional errors that result from this process are unintentional.

## 2023-12-28 NOTE — Telephone Encounter (Signed)
 Patient's spouse is aware of changes made to appointment times/dates; patient has callback number for scheduling line if needing to reschedule or change any appointment times

## 2023-12-29 ENCOUNTER — Encounter: Payer: Self-pay | Admitting: Hematology

## 2023-12-29 ENCOUNTER — Telehealth: Payer: Self-pay

## 2023-12-29 ENCOUNTER — Ambulatory Visit: Payer: Medicare (Managed Care) | Admitting: Adult Health

## 2023-12-29 ENCOUNTER — Encounter: Payer: Self-pay | Admitting: Adult Health

## 2023-12-29 VITALS — BP 98/55 | HR 59 | Ht 68.0 in | Wt 217.0 lb

## 2023-12-29 DIAGNOSIS — R269 Unspecified abnormalities of gait and mobility: Secondary | ICD-10-CM

## 2023-12-29 DIAGNOSIS — H532 Diplopia: Secondary | ICD-10-CM

## 2023-12-29 DIAGNOSIS — H519 Unspecified disorder of binocular movement: Secondary | ICD-10-CM | POA: Diagnosis not present

## 2023-12-29 DIAGNOSIS — I6381 Other cerebral infarction due to occlusion or stenosis of small artery: Secondary | ICD-10-CM | POA: Diagnosis not present

## 2023-12-29 DIAGNOSIS — G459 Transient cerebral ischemic attack, unspecified: Secondary | ICD-10-CM

## 2023-12-29 DIAGNOSIS — G214 Vascular parkinsonism: Secondary | ICD-10-CM

## 2023-12-29 MED ORDER — CARBIDOPA-LEVODOPA 25-100 MG PO TABS: 2.0000 | ORAL_TABLET | Freq: Three times a day (TID) | ORAL | 5 refills | Status: DC

## 2023-12-29 NOTE — Patient Instructions (Signed)
 Increase Sinemet IR to 2 tabs twice daily and continue Sinemet CR 1 tab nightly  Consider evaluation with Dr. Dione Booze at [facility] for eye movement concerns   Continue Xarelto (rivaroxaban) daily  and atorvastatin at current dosages  Follow-up with cardiology next week as scheduled to discuss need of pursuing 2D echo in setting of recent TIA  Continue to follow up with PCP regarding blood pressure, diabetes and cholesterol management  Maintain strict control of hypertension with blood pressure goal below 130/90, diabetes with hemoglobin A1c goal below 7.0 % and cholesterol with LDL cholesterol (bad cholesterol) goal below 70 mg/dL.   Signs of a Stroke? Follow the BEFAST method:  Balance Watch for a sudden loss of balance, trouble with coordination or vertigo Eyes Is there a sudden loss of vision in one or both eyes? Or double vision?  Face: Ask the person to smile. Does one side of the face droop or is it numb?  Arms: Ask the person to raise both arms. Does one arm drift downward? Is there weakness or numbness of a leg? Speech: Ask the person to repeat a simple phrase. Does the speech sound slurred/strange? Is the person confused ? Time: If you observe any of these signs, call 911.     Follow up in April as scheduled - can reschedule for May/June if needed       Thank you for coming to see [place] at [facility]. I hope we have been able to provide you high quality care today.  You may receive a patient satisfaction survey over the next few weeks. We would appreciate your feedback and comments so that we may continue to improve ourselves and the health of our patients.

## 2023-12-29 NOTE — Telephone Encounter (Signed)
 Referral sent to [facility] [phone]

## 2024-01-02 ENCOUNTER — Telehealth: Payer: Self-pay | Admitting: Adult Health

## 2024-01-02 ENCOUNTER — Other Ambulatory Visit: Payer: Self-pay | Admitting: Nurse Practitioner

## 2024-01-02 MED ORDER — RIVAROXABAN 20 MG PO TABS: 20.0000 mg | ORAL_TABLET | Freq: Every day | ORAL | 1 refills | Status: AC

## 2024-01-02 NOTE — Telephone Encounter (Signed)
 Noted! Thank you

## 2024-01-02 NOTE — Telephone Encounter (Signed)
 Contacted pt PCP, Last lipid panel was completed Aug 2024 (126)  and next FU is Jun 11, 2024.  I spoke to pt/ wife and informed them NP recommend he follow-up with PCP in the next month to have this completed in setting of recent TIA. Wife voiced understanding, she will contact PCP to get lipid checked and sent to our office.

## 2024-01-02 NOTE — Telephone Encounter (Signed)
 Requested lab work from PCP office including recent A1c and lipid panel.  Lab work obtained on 12/12/2023 with A1c 7.9.  No lipid profile was completed at this time.    Can his PCP office please be contacted to see when his last lipid panel was completed and when his next follow-up visit is scheduled.  If no lipid panel completed at least in the past 6 months, would recommend he follow-up with PCP in the next month to have this completed in setting of recent TIA.

## 2024-01-04 ENCOUNTER — Encounter: Payer: Self-pay | Admitting: Physical Medicine and Rehabilitation

## 2024-01-04 ENCOUNTER — Emergency Department (HOSPITAL_COMMUNITY)
Admission: EM | Admit: 2024-01-04 | Discharge: 2024-01-06 | Disposition: A | Discharge status: Home / Self Care | Attending: Emergency Medicine | Admitting: Emergency Medicine

## 2024-01-04 ENCOUNTER — Encounter: Payer: Self-pay | Admitting: Hematology

## 2024-01-04 ENCOUNTER — Ambulatory Visit (HOSPITAL_COMMUNITY): Payer: Medicare (Managed Care)

## 2024-01-04 ENCOUNTER — Emergency Department (HOSPITAL_COMMUNITY)

## 2024-01-04 ENCOUNTER — Other Ambulatory Visit: Payer: Self-pay

## 2024-01-04 DIAGNOSIS — Z7984 Long term (current) use of oral hypoglycemic drugs: Secondary | ICD-10-CM | POA: Insufficient documentation

## 2024-01-04 DIAGNOSIS — Z955 Presence of coronary angioplasty implant and graft: Secondary | ICD-10-CM | POA: Diagnosis not present

## 2024-01-04 DIAGNOSIS — I5032 Chronic diastolic (congestive) heart failure: Secondary | ICD-10-CM | POA: Insufficient documentation

## 2024-01-04 DIAGNOSIS — W19XXXA Unspecified fall, initial encounter: Secondary | ICD-10-CM | POA: Diagnosis not present

## 2024-01-04 DIAGNOSIS — E119 Type 2 diabetes mellitus without complications: Secondary | ICD-10-CM | POA: Diagnosis not present

## 2024-01-04 DIAGNOSIS — M79671 Pain in right foot: Secondary | ICD-10-CM | POA: Insufficient documentation

## 2024-01-04 DIAGNOSIS — I110 Hypertensive heart disease with heart failure: Secondary | ICD-10-CM | POA: Diagnosis not present

## 2024-01-04 DIAGNOSIS — I2510 Atherosclerotic heart disease of native coronary artery without angina pectoris: Secondary | ICD-10-CM | POA: Diagnosis not present

## 2024-01-04 DIAGNOSIS — R319 Hematuria, unspecified: Secondary | ICD-10-CM | POA: Insufficient documentation

## 2024-01-04 DIAGNOSIS — R531 Weakness: Secondary | ICD-10-CM | POA: Insufficient documentation

## 2024-01-04 DIAGNOSIS — Z87891 Personal history of nicotine dependence: Secondary | ICD-10-CM | POA: Diagnosis not present

## 2024-01-04 DIAGNOSIS — Z950 Presence of cardiac pacemaker: Secondary | ICD-10-CM | POA: Diagnosis not present

## 2024-01-04 DIAGNOSIS — Z7901 Long term (current) use of anticoagulants: Secondary | ICD-10-CM | POA: Diagnosis not present

## 2024-01-04 DIAGNOSIS — Z8505 Personal history of malignant neoplasm of liver: Secondary | ICD-10-CM | POA: Diagnosis not present

## 2024-01-04 LAB — BASIC METABOLIC PANEL
Anion gap: 19 — ABNORMAL HIGH (ref 5–15)
BUN: 16 mg/dL (ref 8–23)
CO2: 16 mmol/L — ABNORMAL LOW (ref 22–32)
Calcium: 9.3 mg/dL (ref 8.9–10.3)
Chloride: 104 mmol/L (ref 98–111)
Creatinine, Ser: 0.99 mg/dL (ref 0.61–1.24)
GFR, Estimated: >60 mL/min (ref >60)
Glucose, Bld: 135 mg/dL — ABNORMAL HIGH (ref 70–99)
Potassium: 4.7 mmol/L (ref 3.5–5.1)
Sodium: 139 mmol/L (ref 135–145)

## 2024-01-04 LAB — CBC WITH DIFFERENTIAL/PLATELET
Abs Immature Granulocytes: 0.03 10*3/uL (ref 0.00–0.07)
Basophils Absolute: 0.1 10*3/uL (ref 0.0–0.1)
Basophils Relative: 1 %
Eosinophils Absolute: 0.2 10*3/uL (ref 0.0–0.5)
Eosinophils Relative: 1 %
HCT: 36.5 % — ABNORMAL LOW (ref 39.0–52.0)
Hemoglobin: 11.1 g/dL — ABNORMAL LOW (ref 13.0–17.0)
Immature Granulocytes: 0 %
Lymphocytes Relative: 18 %
Lymphs Abs: 2.2 10*3/uL (ref 0.7–4.0)
MCH: 25.9 pg — ABNORMAL LOW (ref 26.0–34.0)
MCHC: 30.4 g/dL (ref 30.0–36.0)
MCV: 85.1 fL (ref 80.0–100.0)
Monocytes Absolute: 1.2 10*3/uL — ABNORMAL HIGH (ref 0.1–1.0)
Monocytes Relative: 9 %
Neutro Abs: 9 10*3/uL — ABNORMAL HIGH (ref 1.7–7.7)
Neutrophils Relative %: 71 %
Platelets: 318 10*3/uL (ref 150–400)
RBC: 4.29 MIL/uL (ref 4.22–5.81)
RDW: 16.6 % — ABNORMAL HIGH (ref 11.5–15.5)
WBC: 12.7 10*3/uL — ABNORMAL HIGH (ref 4.0–10.5)
nRBC: 0 % (ref 0.0–0.2)

## 2024-01-04 LAB — RESP PANEL BY RT-PCR (RSV, FLU A&B, COVID)  RVPGX2
Influenza A by PCR: NEGATIVE
Influenza B by PCR: NEGATIVE
Resp Syncytial Virus by PCR: NEGATIVE
SARS Coronavirus 2 by RT PCR: NEGATIVE

## 2024-01-04 LAB — URINALYSIS, ROUTINE W REFLEX MICROSCOPIC
Bilirubin Urine: NEGATIVE
Glucose, UA: NEGATIVE mg/dL
Ketones, ur: 5 mg/dL — AB
Leukocytes,Ua: NEGATIVE
Nitrite: NEGATIVE
Protein, ur: 100 mg/dL — AB
RBC / HPF: >50 RBC/hpf (ref 0–5)
Specific Gravity, Urine: 1.019 (ref 1.005–1.030)
pH: 6 (ref 5.0–8.0)

## 2024-01-04 MED ORDER — GLUCERNA SHAKE PO LIQD
237.0000 mL | Freq: Two times a day (BID) | ORAL | Status: DC
  Administered 2024-01-05 – 2024-01-06 (×3): 237 mL via ORAL
  Filled 2024-01-04 (×5): qty 237

## 2024-01-04 MED ORDER — SODIUM CHLORIDE 0.9 % IV BOLUS
1000.0000 mL | Freq: Once | INTRAVENOUS | Status: AC
  Administered 2024-01-04: 1000 mL via INTRAVENOUS

## 2024-01-04 MED ORDER — MODAFINIL 100 MG PO TABS
200.0000 mg | ORAL_TABLET | Freq: Every day | ORAL | Status: DC
  Administered 2024-01-05 – 2024-01-06 (×2): 200 mg via ORAL
  Filled 2024-01-04 (×2): qty 2
  Filled 2024-01-04: qty 1

## 2024-01-04 MED ORDER — OMEGA-3-ACID ETHYL ESTERS 1 G PO CAPS
1.0000 | ORAL_CAPSULE | Freq: Every day | ORAL | Status: DC
  Administered 2024-01-05 – 2024-01-06 (×2): 1 g via ORAL
  Filled 2024-01-04 (×3): qty 1

## 2024-01-04 MED ORDER — RIVAROXABAN 10 MG PO TABS
20.0000 mg | ORAL_TABLET | Freq: Every day | ORAL | Status: DC
  Administered 2024-01-05 – 2024-01-06 (×2): 20 mg via ORAL
  Filled 2024-01-04 (×2): qty 2

## 2024-01-04 MED ORDER — POLYETHYLENE GLYCOL 3350 17 G PO PACK
17.0000 g | PACK | Freq: Every day | ORAL | Status: DC
  Administered 2024-01-06: 17 g via ORAL
  Filled 2024-01-04: qty 1

## 2024-01-04 MED ORDER — METFORMIN HCL 500 MG PO TABS
1000.0000 mg | ORAL_TABLET | Freq: Two times a day (BID) | ORAL | Status: DC
  Administered 2024-01-05 – 2024-01-06 (×5): 1000 mg via ORAL
  Filled 2024-01-04 (×5): qty 2

## 2024-01-04 MED ORDER — QUETIAPINE FUMARATE 25 MG PO TABS
12.5000 mg | ORAL_TABLET | Freq: Every day | ORAL | Status: DC
  Administered 2024-01-05 (×2): 12.5 mg via ORAL
  Filled 2024-01-04 (×2): qty 1

## 2024-01-04 MED ORDER — VITAMIN D 25 MCG (1000 UNIT) PO TABS
1000.0000 [IU] | ORAL_TABLET | Freq: Every day | ORAL | Status: DC
  Administered 2024-01-06: 1000 [IU] via ORAL
  Filled 2024-01-04: qty 1

## 2024-01-04 MED ORDER — MAGNESIUM GLUCONATE 500 MG PO TABS
250.0000 mg | ORAL_TABLET | Freq: Every day | ORAL | Status: DC
  Administered 2024-01-05: 250 mg via ORAL
  Filled 2024-01-04 (×2): qty 1

## 2024-01-04 MED ORDER — LINAGLIPTIN 5 MG PO TABS
5.0000 mg | ORAL_TABLET | Freq: Every day | ORAL | Status: DC
  Administered 2024-01-05 – 2024-01-06 (×2): 5 mg via ORAL
  Filled 2024-01-04 (×2): qty 1

## 2024-01-04 MED ORDER — ADULT MULTIVITAMIN W/MINERALS CH
1.0000 | ORAL_TABLET | Freq: Every day | ORAL | Status: DC
  Administered 2024-01-05 – 2024-01-06 (×2): 1 via ORAL
  Filled 2024-01-04 (×2): qty 1

## 2024-01-04 MED ORDER — ATORVASTATIN CALCIUM 10 MG PO TABS
20.0000 mg | ORAL_TABLET | Freq: Every day | ORAL | Status: DC
  Administered 2024-01-05 – 2024-01-06 (×2): 20 mg via ORAL
  Filled 2024-01-04 (×2): qty 2

## 2024-01-04 MED ORDER — CARBIDOPA-LEVODOPA 25-100 MG PO TABS
2.0000 | ORAL_TABLET | Freq: Three times a day (TID) | ORAL | Status: DC
  Administered 2024-01-05 – 2024-01-06 (×7): 2 via ORAL
  Filled 2024-01-04 (×7): qty 2

## 2024-01-04 MED ORDER — MELATONIN 5 MG PO TABS
10.0000 mg | ORAL_TABLET | Freq: Every day | ORAL | Status: DC
  Administered 2024-01-05 (×2): 10 mg via ORAL
  Filled 2024-01-04 (×2): qty 2

## 2024-01-04 MED ORDER — ACETAMINOPHEN 500 MG PO TABS
500.0000 mg | ORAL_TABLET | Freq: Four times a day (QID) | ORAL | Status: DC | PRN
  Administered 2024-01-05 – 2024-01-06 (×2): 1000 mg via ORAL
  Filled 2024-01-04 (×2): qty 2

## 2024-01-04 MED ORDER — CARBIDOPA-LEVODOPA ER 50-200 MG PO TBCR
1.0000 | EXTENDED_RELEASE_TABLET | Freq: Every day | ORAL | Status: DC
  Administered 2024-01-05 (×2): 1 via ORAL
  Filled 2024-01-04 (×3): qty 1

## 2024-01-04 MED ORDER — DOCUSATE SODIUM 100 MG PO CAPS
100.0000 mg | ORAL_CAPSULE | Freq: Two times a day (BID) | ORAL | Status: DC
  Administered 2024-01-05: 100 mg via ORAL
  Filled 2024-01-04: qty 1

## 2024-01-04 NOTE — ED Notes (Signed)
 Patient transported to X-ray

## 2024-01-04 NOTE — ED Notes (Signed)
Patient given water to help provide urine sample.

## 2024-01-04 NOTE — ED Provider Notes (Signed)
 [place] EMERGENCY DEPARTMENT AT [facility] Provider Note  CSN: [id number] Arrival date & time: 01/04/24 1420  Chief Complaint(s) No chief complaint on file.  HPI Jordan [name] is a 83 y.o. male history of DVT, CHF, hypertension, hyperlipidemia, diabetes, Parkinson's presenting with recurrent falls.  Patient has some chronic generalized weakness, had 2 recent falls, fell to knees, no injury other than some pain in the right foot.  No head injury.  Had trouble getting up and EMS was called to help get him up.  Had fall first last night and was helped by EMS, did not come to the hospital.  Patient had second fall today, given repeated fall wife brought him in.  Otherwise has been at his baseline state of health.  Has some chronic hematuria, no new dysuria.  No fevers or chills.  No cough.  No runny nose or sore throat.  No chest pain or shortness of breath.  No diarrhea.   Past Medical History Past Medical History:  Diagnosis Date   Arthritis    R shoulder, bone spur   Arthritis    "hands" (03/25/2016)   Chronic diastolic heart failure (HCC)    Chronic lower back pain    Coronary heart disease    Dr Garnette Scheuermann   Diverticulitis 12/2021   DVT (deep venous thrombosis) (HCC) 05/2022   left leg   H/O cardiovascular stress test    perhaps last one was 2009   Heart murmur    12/29/11 echo: mild MR, no AS, trivial TR   History of gout    Hyperlipidemia    Hypertension    saw Dr. Mendel Ryder last earl;y- 2014, cardiac cath. last done ?2009, blocks seen didn't require any intervention at that point.   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   Acute deep vein thrombosis (DVT) of left lower extremity (HCC) 04/29/2022   Closed intertrochanteric fracture of left hip, initial encounter (HCC)    Closed left hip fracture (HCC) secondary to fall at home 02/08/2022   Paroxysmal atrial fibrillation (HCC) 02/08/2022   Debility 01/08/2022   Constipation 01/07/2022   Physical deconditioning 01/07/2022   Acute metabolic encephalopathy 01/04/2022   Hypomagnesemia 01/04/2022   Pancreatitis 12/30/2021   Parkinson's disease (HCC) 12/21/2021   Parkinson disease (HCC) 12/18/2021   Frequent falls 12/18/2021   History of CVA (cerebrovascular accident) 12/18/2021   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   Hallucinations, visual 07/30/2021   Acetabulum fracture, left (HCC) 06/25/2021   Acute hip pain, left 06/24/2021   Fall at home, initial encounter 06/24/2021   Leukocytosis 06/24/2021   Chronic diastolic heart failure (HCC)    Sinusitis, acute maxillary 06/24/2020   AKI (acute kidney injury) (HCC)    Labile blood glucose    Cough    Benign essential HTN    Skin lesion of hand    Hypoalbuminemia due to protein-calorie malnutrition (HCC)    Controlled type 2 diabetes mellitus with hyperglycemia, without long-term current use of insulin (HCC)    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  Morbid obesity (HCC)    Acute blood loss anemia    Acute thalamic infarction (HCC) 04/15/2020   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 Authorizing Provider  acetaminophen (TYLENOL) 500 MG tablet Take 500-1,000 mg by mouth every 6 (six) hours as needed for moderate pain or mild pain.    [provider]  atorvastatin (LIPITOR) 40 MG tablet Take 0.5 tablets (20 mg total) by mouth daily. 07/30/21   Medina-Vargas, Monina C, NP  bifidobacterium infantis (ALIGN) capsule Take 1 capsule by mouth daily.    [provider]  Camphor-Menthol-Methyl Sal (HM SALONPAS PAIN RELIEF EX) Apply 1 patch topically daily as needed (Back pain).    [provider]  carbidopa-levodopa (SINEMET CR) 50-200 MG tablet Take 1 tablet by mouth at bedtime. 08/17/23   Ihor Austin, NP  carbidopa-levodopa (SINEMET IR) 25-100 MG tablet Take 2 tablets by mouth 3 (three) times daily. 12/29/23   Ihor Austin, NP  cholecalciferol (VITAMIN D3) 25 MCG (1000 UNIT) tablet Take 1,000 Units by mouth daily.    [provider]  clotrimazole-betamethasone (LOTRISONE) cream Apply 1 Application topically daily as needed (Rash). 06/21/22   [provider]  docusate sodium (COLACE) 100 MG capsule Take 1 capsule (100 mg total) by mouth 2 (two) times daily. Patient taking differently: Take 100 mg by mouth daily as needed for mild constipation or moderate constipation. As Needed 02/17/22   Kathlen Mody, MD  feeding supplement, GLUCERNA SHAKE, (GLUCERNA SHAKE) LIQD Take 237 mLs by mouth 2 (two) times daily between meals. Alternate with ensure Diabetic    [provider]  furosemide (LASIX) 20 MG tablet USE AS NEEDED FOR UP TO 5 DAYS IF SWELLING IN LEGS RECURS. CONTACT CARDIOLOGY IS SWELLING DOES NOT IMPROVE OR GETS WORSE. 01/26/22   Raulkar, Drema Pry, MD  lidocaine (LIDODERM) 5 % Place 1 patch onto the skin daily as needed. Purchase over the counter. On for 12 hours and off for 12 hours 01/18/22   Love, Evlyn Kanner, PA-C  magnesium gluconate (MAGONATE) 500 MG tablet Take 0.5 tablets  (250 mg total) by mouth at bedtime. 03/24/22   Raulkar, Drema Pry, MD  Melatonin 10 MG TABS Take 10 mg by mouth at bedtime.    [provider]  metFORMIN (GLUCOPHAGE) 500 MG tablet Take 1,000 mg by mouth 2 (two) times daily. 1000 mg in the am and 1000 mg at night    [provider]  modafinil (PROVIGIL) 200 MG tablet TAKE 1 TABLET BY MOUTH EVERY DAY 12/12/23   Waymon Budge, MD  Multiple Vitamin (MULTIVITAMIN WITH MINERALS) TABS tablet Take 1 tablet by mouth daily. 02/19/18   Alwyn Ren, MD  NITROSTAT 0.4 MG SL tablet PLACE 1 TABLET (0.4 MG TOTAL) UNDER THE TONGUE EVERY 5 (FIVE) MINUTES AS NEEDED FOR CHEST PAIN. Patient taking differently: Place 0.4 mg under the tongue every 5 (five) minutes as needed for chest pain. 03/31/16   Lyn Records, MD  Omega-3 Fatty Acids (OMEGA 3 PO)  Take 1,280 mg by mouth daily.    [provider]  polyethylene glycol (MIRALAX / GLYCOLAX) 17 g packet Take 17 g by mouth daily. 01/09/22   Lanae Boast, MD  polyvinyl alcohol (LIQUIFILM TEARS) 1.4 % ophthalmic solution Place 1 drop into both eyes as needed for dry eyes. Patient taking differently: Place 1 drop into both eyes daily as needed for dry eyes. 01/18/22   Love, Evlyn Kanner, PA-C  QUEtiapine (SEROQUEL) 25 MG tablet TAKE 1/2 TABLETS (12.5 MG TOTAL) BY MOUTH AT BEDTIME. 06/09/23   Raulkar, Drema Pry, MD  rivaroxaban (XARELTO) 20 MG TABS tablet Take 1 tablet (20 mg total) by mouth daily. 01/02/24   Carlean Jews, NP  sitaGLIPtin (JANUVIA) 50 MG tablet Take 50 mg by mouth daily.    [provider]  Vitamin D, Ergocalciferol, (DRISDOL) 1.25 MG (50000 UNIT) CAPS capsule TAKE 1 CAPSULE (50,000 UNITS TOTAL) BY MOUTH EVERY 7 (SEVEN) DAYS 05/06/22   Raulkar, Drema Pry, MD                                                                                                                                    Past Surgical History Past Surgical History:  Procedure Laterality Date   CARDIAC  CATHETERIZATION  2009?   EP IMPLANTABLE DEVICE N/A 03/25/2016   Procedure: Pacemaker Implant - Dual Chamber;  Surgeon: Marinus Maw, MD;  Location: [facility] INVASIVE CV LAB;  Service: Cardiovascular;  Laterality: N/A;   FRACTURE SURGERY     HIP FRACTURE SURGERY     INSERT / REPLACE / REMOVE PACEMAKER  03/24/2016   INTRAMEDULLARY (IM) NAIL INTERTROCHANTERIC Left 02/08/2022   Procedure: INTRAMEDULLARY (IM) NAIL INTERTROCHANTRIC;  Surgeon: Cammy Copa, MD;  Location: [facility] OR;  Service: Orthopedics;  Laterality: Left;   IR KYPHO LUMBAR INC FX REDUCE BONE BX UNI/BIL CANNULATION INC/IMAGING  12/29/2021   IR RADIOLOGIST EVAL & MGMT  12/15/2021   IR RADIOLOGY PERIPHERAL GUIDED IV START  07/14/2022   IR US GUIDE VASC ACCESS RIGHT  07/15/2022   LEFT AND RIGHT HEART CATHETERIZATION WITH CORONARY ANGIOGRAM N/A 06/05/2014   Procedure: LEFT AND RIGHT HEART CATHETERIZATION WITH CORONARY ANGIOGRAM;  Surgeon: Lesleigh Noe, MD;  Location: [facility] CATH LAB;  Service: Cardiovascular;  Laterality: N/A;   NASAL FRACTURE SURGERY     SHOULDER ARTHROSCOPY WITH ROTATOR CUFF REPAIR AND SUBACROMIAL DECOMPRESSION Right 08/16/2013   Procedure: SHOULDER ARTHROSCOPY WITH ROTATOR CUFF REPAIR AND SUBACROMIAL DECOMPRESSION;  Surgeon: Mable Paris, MD;  Location: [facility] OR;  Service: Orthopedics;  Laterality: Right;  Right shoulder arthroscopy rotator cuff repair, subacromial decompression.   TONSILLECTOMY     Family History Family History  Problem Relation Age of Onset   Sleep apnea Sister    Colon cancer Sister     Social History Social History   Tobacco Use   Smoking status: Former    Current packs/day: 0.00  Average packs/day: 2.5 packs/day for 6.0 years (15.0 ttl pk-yrs)    Types: Cigarettes    Start date: 06/04/1961    Quit date: 06/05/1967    Years since quitting: 56.6   Smokeless tobacco: Never  Vaping Use   Vaping status: Never Used  Substance Use Topics   Alcohol use: Yes    Comment: rare    Drug use: No   Allergies Amantadines, Clonazepam, and Zoloft [sertraline]  Review of Systems Review of Systems  All other systems reviewed and are negative.   Physical Exam Vital Signs  I have reviewed the triage vital signs BP (!) 147/82 (BP Location: Right Arm)   Pulse 69   Temp 98.4 F (36.9 C) (Oral)   Resp 18   Ht 5\' 8"  (1.727 m)   Wt 98.4 kg   SpO2 93%   BMI 32.98 kg/m  Physical Exam Vitals and nursing note reviewed.  Constitutional:      General: He is not in acute distress.    Appearance: Normal appearance.  HENT:     Mouth/Throat:     Mouth: Mucous membranes are moist.  Eyes:     Conjunctiva/sclera: Conjunctivae normal.  Cardiovascular:     Rate and Rhythm: Normal rate and regular rhythm.  Pulmonary:     Effort: Pulmonary effort is normal. No respiratory distress.     Breath sounds: Normal breath sounds.  Abdominal:     General: Abdomen is flat.     Palpations: Abdomen is soft.     Tenderness: There is no abdominal tenderness.  Musculoskeletal:     Right lower leg: No edema.     Left lower leg: No edema.     Comments: Slight tenderness around the right hallux, otherwise no midline C, T, L-spine tenderness.  No chest wall tenderness or crepitus.  Full painless range of motion at the bilateral upper extremities including the shoulders, elbows, wrists, hand and fingers, and in the bilateral lower extremities including the hips, knees, ankle, toes.  No focal bony tenderness, injury or deformity other than right hallux tenderness.   Skin:    General: Skin is warm and dry.     Capillary Refill: Capillary refill takes less than 2 seconds.  Neurological:     Mental Status: He is alert and oriented to person, place, and time. Mental status is at baseline.  Psychiatric:        Mood and Affect: Mood normal.        Behavior: Behavior normal.     ED Results and Treatments Labs (all labs ordered are listed, but only abnormal results are displayed) Labs Reviewed   BASIC METABOLIC PANEL - Abnormal; Notable for the following components:      Result Value   CO2 16 (*)    Glucose, Bld 135 (*)    Anion gap 19 (*)    All other components within normal limits  URINALYSIS, ROUTINE W REFLEX MICROSCOPIC - Abnormal; Notable for the following components:   Color, Urine AMBER (*)    APPearance CLOUDY (*)    Hgb urine dipstick LARGE (*)    Ketones, ur 5 (*)    Protein, ur 100 (*)    Bacteria, UA RARE (*)    All other components within normal limits  CBC WITH DIFFERENTIAL/PLATELET - Abnormal; Notable for the following components:   WBC 12.7 (*)    Hemoglobin 11.1 (*)    HCT 36.5 (*)    MCH 25.9 (*)    RDW 16.6 (*)  Neutro Abs 9.0 (*)    Monocytes Absolute 1.2 (*)    All other components within normal limits  RESP PANEL BY RT-PCR (RSV, FLU A&B, COVID)  RVPGX2  CBC WITH DIFFERENTIAL/PLATELET                                                                                                                          Radiology CT Head Wo Contrast Result Date: 01/04/2024 CLINICAL DATA:  Generalized weakness and fall. EXAM: CT HEAD WITHOUT CONTRAST TECHNIQUE: Contiguous axial images were obtained from the base of the skull through the vertex without intravenous contrast. RADIATION DOSE REDUCTION: This exam was performed according to the departmental dose-optimization program which includes automated exposure control, adjustment of the mA and/or kV according to patient size and/or use of iterative reconstruction technique. COMPARISON:  December 22, 2023 FINDINGS: Brain: There is generalized cerebral atrophy with widening of the extra-axial spaces and ventricular dilatation. There are areas of decreased attenuation within the white matter tracts of the supratentorial brain, consistent with microvascular disease changes. Very small bilateral chronic basal gangliar lacunar infarcts are noted. Vascular: Marked severity bilateral cavernous carotid artery calcification is  seen. Skull: Normal. Negative for fracture or focal lesion. Sinuses/Orbits: There is stable marked severity right maxillary sinus, right ethmoid sinus and frontal sinus mucosal thickening. Moderate severity left maxillary sinus mucosal thickening is also seen. Other: None. IMPRESSION: 1. Generalized cerebral atrophy with chronic white matter small vessel ischemic changes. 2. Very small bilateral chronic basal gangliar lacunar infarcts. 3. No acute intracranial abnormality. 4. Stable marked severity paranasal sinus disease Electronically Signed   By: Aram Candela M.D.   On: 01/04/2024 20:29   DG Foot Complete Right Result Date: 01/04/2024 CLINICAL DATA:  Right foot pain when weight-bearing. EXAM: RIGHT FOOT COMPLETE - 3+ VIEW COMPARISON:  None Available. FINDINGS: Demineralization. No acute fracture or dislocation. Plantar calcaneal spur. Soft tissues are radiographically unremarkable. IMPRESSION: No acute fracture or dislocation. Electronically Signed   By: Minerva Fester M.D.   On: 01/04/2024 20:17   DG Chest 1 View Result Date: 01/04/2024 CLINICAL DATA:  Generalized weakness and fall last night. EXAM: CHEST  1 VIEW COMPARISON:  09/16/2022 FINDINGS: Stable cardiomediastinal silhouette. Aortic atherosclerotic calcification. Left chest wall pacemaker. Left basilar atelectasis/scarring. Otherwise no focal consolidation, pleural effusion, or pneumothorax. IMPRESSION: No active disease. Electronically Signed   By: Minerva Fester M.D.   On: 01/04/2024 20:14    Pertinent labs & imaging results that were available during my care of the patient were reviewed by me and considered in my medical decision making (see MDM for details).  Medications Ordered in ED Medications  acetaminophen (TYLENOL) tablet 500-1,000 mg (has no administration in time range)  atorvastatin (LIPITOR) tablet 20 mg (has no administration in time range)  carbidopa-levodopa (SINEMET CR) 50-200 MG per tablet controlled release 1 tablet  (has no administration in time range)  carbidopa-levodopa (SINEMET IR) 25-100 MG per tablet immediate release 2 tablet (has no administration  in time range)  cholecalciferol (VITAMIN D3) 25 MCG (1000 UNIT) tablet 1,000 Units (has no administration in time range)  docusate sodium (COLACE) capsule 100 mg (has no administration in time range)  feeding supplement (GLUCERNA SHAKE) (GLUCERNA SHAKE) liquid 237 mL (has no administration in time range)  magnesium gluconate (MAGONATE) tablet 250 mg (has no administration in time range)  Melatonin TABS 10 mg (has no administration in time range)  metFORMIN (GLUCOPHAGE) tablet 1,000 mg (has no administration in time range)  modafinil (PROVIGIL) tablet 200 mg (has no administration in time range)  multivitamin with minerals tablet 1 tablet (has no administration in time range)  Omega 3 CAPS (has no administration in time range)  polyethylene glycol (MIRALAX / GLYCOLAX) packet 17 g (has no administration in time range)  QUEtiapine (SEROQUEL) tablet 12.5 mg (has no administration in time range)  rivaroxaban (XARELTO) tablet 20 mg (has no administration in time range)  linagliptin (TRADJENTA) tablet 5 mg (has no administration in time range)  sodium chloride 0.9 % bolus 1,000 mL (0 mLs Intravenous Stopped 01/04/24 2304)                                                                                                                                     Procedures Procedures  (including critical care time)  Medical Decision Making / ED Course   MDM:  83 year old presenting to the emergency department with fall, weakness.  Patient overall well-appearing, physical examination notable for some tenderness around the right hallux.  X-ray obtained with no evidence of fracture or deformity.  Given generalized weakness, obtained labs, labs with mild low CO2, mild elevated anion gap, likely some dehydration, no significant hyperglycemia to suggest DKA, patient not  on SGLT2 to suggest euglycemic DKA.  Patient otherwise very well-appearing.  Labs also with mild leukocytosis likely also due to some dehydration.  Will give some IV fluids.  Differential includes intracranial process, will check head CT although seems at neurologic baseline.  Differential includes toxic or metabolic process such as mild dehydration.  Electrolytes are overall reassuring otherwise.  No hyponatremia, hypokalemia.  Differential also includes occult infection, has had some cough, will order chest x-ray, urinalysis as well.  Will reassess.  Given increased weakness, if workup is overall reassuring, will discuss with wife whether patient needs physical therapy  Clinical Course as of 01/04/24 2335  Wed Jan 04, 2024  2334 Workup without clear underlying cause of weakness other than mild dehydration.  Patient received IV fluids.  Do not think that there is any specific criteria for inpatient admission but patient is weaker than normal and would benefit from physical therapy and TOC consult.  He will be boarding for this.  Home medications have been reordered. [WS]    Clinical Course User Index [WS] Lonell Grandchild, MD     Additional history obtained: -Additional history obtained from ems and spouse -External records from outside source  obtained and reviewed including: Chart review including previous notes, labs, imaging, consultation notes including prior notes    Lab Tests: -I ordered, reviewed, and interpreted labs.   The pertinent results include:   Labs Reviewed  BASIC METABOLIC PANEL - Abnormal; Notable for the following components:      Result Value   CO2 16 (*)    Glucose, Bld 135 (*)    Anion gap 19 (*)    All other components within normal limits  URINALYSIS, ROUTINE W REFLEX MICROSCOPIC - Abnormal; Notable for the following components:   Color, Urine AMBER (*)    APPearance CLOUDY (*)    Hgb urine dipstick LARGE (*)    Ketones, ur 5 (*)    Protein, ur 100 (*)     Bacteria, UA RARE (*)    All other components within normal limits  CBC WITH DIFFERENTIAL/PLATELET - Abnormal; Notable for the following components:   WBC 12.7 (*)    Hemoglobin 11.1 (*)    HCT 36.5 (*)    MCH 25.9 (*)    RDW 16.6 (*)    Neutro Abs 9.0 (*)    Monocytes Absolute 1.2 (*)    All other components within normal limits  RESP PANEL BY RT-PCR (RSV, FLU A&B, COVID)  RVPGX2  CBC WITH DIFFERENTIAL/PLATELET    Notable for mild dehydration  EKG   EKG Interpretation Date/Time:  Wednesday January 04 2024 15:47:08 EST Ventricular Rate:  69 PR Interval:  286 QRS Duration:  136 QT Interval:  422 QTC Calculation: 452 R Axis:   -53  Text Interpretation: Atrial-paced rhythm with prolonged AV conduction Right bundle branch block Left anterior fascicular block Bifascicular block Minimal voltage criteria for LVH, may be normal variant ( R in aVL ) Possible Lateral infarct , age undetermined Abnormal ECG Confirmed by Alvino Blood ([id number]) on 01/04/2024 4:21:48 PM         Imaging Studies ordered: I ordered imaging studies including CT head, CXR On my interpretation imaging demonstrates no acute process I independently visualized and interpreted imaging. I agree with the radiologist interpretation   Medicines ordered and prescription drug management: Meds ordered this encounter  Medications   sodium chloride 0.9 % bolus 1,000 mL   acetaminophen (TYLENOL) tablet 500-1,000 mg   atorvastatin (LIPITOR) tablet 20 mg   carbidopa-levodopa (SINEMET CR) 50-200 MG per tablet controlled release 1 tablet   carbidopa-levodopa (SINEMET IR) 25-100 MG per tablet immediate release 2 tablet   cholecalciferol (VITAMIN D3) 25 MCG (1000 UNIT) tablet 1,000 Units   docusate sodium (COLACE) capsule 100 mg   feeding supplement (GLUCERNA SHAKE) (GLUCERNA SHAKE) liquid 237 mL   magnesium gluconate (MAGONATE) tablet 250 mg   Melatonin TABS 10 mg   metFORMIN (GLUCOPHAGE) tablet 1,000 mg   modafinil  (PROVIGIL) tablet 200 mg   multivitamin with minerals tablet 1 tablet   Omega 3 CAPS   polyethylene glycol (MIRALAX / GLYCOLAX) packet 17 g   QUEtiapine (SEROQUEL) tablet 12.5 mg   rivaroxaban (XARELTO) tablet 20 mg   linagliptin (TRADJENTA) tablet 5 mg    -I have reviewed the patients home medicines and have made adjustments as needed   Social Determinants of Health:  Diagnosis or treatment significantly limited by social determinants of health: obesity   Reevaluation: After the interventions noted above, I reevaluated the patient and found that their symptoms have stayed the same  Co morbidities that complicate the patient evaluation  Past Medical History:  Diagnosis Date   Arthritis  R shoulder, bone spur   Arthritis    "hands" (03/25/2016)   Chronic diastolic heart failure (HCC)    Chronic lower back pain    Coronary heart disease    Dr Garnette Scheuermann   Diverticulitis 12/2021   DVT (deep venous thrombosis) (HCC) 05/2022   left leg   H/O cardiovascular stress test    perhaps last one was 2009   Heart murmur    12/29/11 echo: mild MR, no AS, trivial TR   History of gout    Hyperlipidemia    Hypertension    saw Dr. Mendel Ryder last earl;y- 2014, cardiac cath. last done ?2009, blocks seen didn't require any intervention at that point.   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  Despite best efforts to proofread,  errors can occur which can change the  documentation meaning.    Lonell Grandchild, MD 01/04/24 [phone]

## 2024-01-04 NOTE — ED Triage Notes (Signed)
 [facility] from home, generalized weakness fell last night and EMS went out for lifting assistance. No injury. No loc. Pt alert and oriented x 4  Cbg 148

## 2024-01-04 NOTE — ED Provider Triage Note (Signed)
 Emergency Medicine Provider Triage Evaluation Note  Jordan [name] , a 83 y.o. male  was evaluated in triage.  Pt complains of weakness and slipped to the ground.  Brought in by ambulance.  Denies any injuries.  Review of Systems  Positive: In the legs Negative: Chest pain  Physical Exam  There were no vitals taken for this visit. Gen:   Awake, no distress   Resp:  Normal effort  MSK:   Moves extremities without difficulty  Other:    Medical Decision Making  Medically screening exam initiated at 2:40 PM.  Appropriate orders placed.  Epifania Gore was informed that the remainder of the evaluation will be completed by another provider, this initial triage assessment does not replace that evaluation, and the importance of remaining in the ED until their evaluation is complete.     Terrilee Files, MD 01/04/24 [phone]

## 2024-01-04 NOTE — ED Notes (Addendum)
 Per visitor pt needs a soft diet and is diabetic.  Pt loves chocolate ice cream.  Apple sauce is fine.    Thadius Smisek - wife, [phone]

## 2024-01-05 ENCOUNTER — Ambulatory Visit: Payer: Medicare (Managed Care) | Admitting: Physical Therapy

## 2024-01-05 ENCOUNTER — Ambulatory Visit: Payer: Medicare (Managed Care) | Admitting: Speech Pathology

## 2024-01-05 MED ORDER — DOCUSATE SODIUM 100 MG PO CAPS: 100.0000 mg | ORAL_CAPSULE | Freq: Two times a day (BID) | ORAL | Status: DC | PRN

## 2024-01-05 NOTE — ED Notes (Signed)
 PT at bedside.

## 2024-01-05 NOTE — Progress Notes (Addendum)
 11:50am: CSW spoke with Britta Mccreedy of CIR who states patient does not criteria for admission to CIR.  CSW spoke with patient's wife [name] to discuss therapy recommendations. [name] states patient has had two falls in the last 24 hours at home. [name] states she believes patient is having a "Parkinson's exacerbation." [name] agreeable for CSW to contact admissions at [facility]. [name] states she wants to speak with MD regarding patient not being medically admitted - [name] states she will arrive in approximately one hour.  CSW spoke with West Carbo at [facility] who states she is agreeable to review referral via [facility] - referral was sent.  7:45am: CSW received consult for patient for possible SNF placement. Therapies have been ordered - CSW will wait for therapy evaluations to determine what level of care is recommended for patient.  Edwin Dada, MSW, LCSW Transitions of Care  Clinical Social Worker II [phone]

## 2024-01-05 NOTE — Evaluation (Signed)
 Occupational Therapy Evaluation Patient Details Name: Jordan [name] MRN: [id number] DOB: 10-14-1941 Today's Date: 01/05/2024   History of Present Illness   Pt is 83 yo presenting to [facility] ED following two falls on 01/04/24  PMHx: PD, HLD, OSA, DM, HTN, chronic diastolic HF, CVA, frequent falls, L2 compression fx, L hip fx     Clinical Impressions Pt typically walks with a RW short distances and uses a w/c for longer. He and his wife endorse increasing falls and weakness. Pt's wife assists him in shower transfers, LB bathing and dressing for speed, pericare and all IADLs including medication management. She is considering caregiver assistance in the home through the [place]. Pt was scheduled to begin outpatient neuro PT, but falls prevented. Pt has been through [employer] in the past, but is not one to continue exercises without assistance. Pt has all necessary DME and he has simplified his life as much as possible with easy to don clothing and shoes. Pt and wife are proactively attempting to prevent further falls and seeking intensive rehab. Patient will benefit from intensive inpatient follow-up therapy, >3 hours/day. Per chart, SW is reaching out to outside rehabilitation programs. Will follow if pt is admitted acutely.      If plan is discharge home, recommend the following:   A little help with walking and/or transfers;A lot of help with bathing/dressing/bathroom;Assistance with cooking/housework;Direct supervision/assist for medications management;Direct supervision/assist for financial management;Assist for transportation;Help with stairs or ramp for entrance;Supervision due to cognitive status     Functional Status Assessment   Patient has had a recent decline in their functional status and/or demonstrates limited ability to make significant improvements in function in a reasonable and predictable amount of time     Equipment Recommendations   None recommended by OT      Recommendations for Other Services         Precautions/Restrictions   Precautions Precautions: Fall Restrictions Weight Bearing Restrictions Per Provider Order: No     Mobility Bed Mobility                    Transfers Overall transfer level: Needs assistance     Sit to Stand: Contact guard assist, From elevated surface                  Balance             Standing balance-Leahy Scale: Poor Standing balance comment: buckles in knees at times, posterior LOB at home                           ADL either performed or assessed with clinical judgement   ADL Overall ADL's : Needs assistance/impaired Eating/Feeding: Independent   Grooming: Set up;Sitting   Upper Body Bathing: Minimal assistance;Sitting   Lower Body Bathing: Total assistance   Upper Body Dressing : Set up;Sitting   Lower Body Dressing: Total assistance                 General ADL Comments: pt is typically assisted for LB ADLs, wears clothing without fasteners which are loose fitting, slip on shoes, sit to shower, wife supervises shower transfers for safety     Vision Patient Visual Report: No change from baseline       Perception         Praxis         Pertinent Vitals/Pain Pain Assessment Pain Assessment: No/denies pain  Extremity/Trunk Assessment Upper Extremity Assessment Upper Extremity Assessment: Overall WFL for tasks assessed   Lower Extremity Assessment Lower Extremity Assessment: Defer to PT evaluation   Cervical / Trunk Assessment Cervical / Trunk Assessment: Kyphotic   Communication Communication Communication: No apparent difficulties (soft voice and tangential at times)   Cognition Arousal: Alert Behavior During Therapy: WFL for tasks assessed/performed Cognition:  (likely baseline)                               Following commands: Intact       Cueing  General Comments          Exercises      Shoulder Instructions      Home Living Family/patient expects to be discharged to:: Private residence Living Arrangements: Spouse/significant other;Children Available Help at Discharge: Family;Available 24 hours/day Type of Home: Other(Comment) (condo) Home Access: Level entry;Elevator     Home Layout: One level     Bathroom Shower/Tub: Chief Strategy Officer: Handicapped height     Home Equipment: Agricultural consultant (2 wheels);Wheelchair - Manufacturing systems engineer;Other (comment);Grab bars - tub/shower;Grab bars - toilet   Additional Comments: lift chair      Prior Functioning/Environment Prior Level of Function : Needs assist             Mobility Comments: Pt ambulatory for short distances with RW and W/C for longer community distances, endorses falls and sedentary lifestyle. ADLs Comments: assisted for washing back, bottom, feet and dressing LB, wife manages medications    OT Problem List: Impaired balance (sitting and/or standing)   OT Treatment/Interventions: Self-care/ADL training      OT Goals(Current goals can be found in the care plan section)   Acute Rehab OT Goals Time For Goal Achievement: 01/19/24 Potential to Achieve Goals: Fair ADL Goals Pt Will Perform Grooming: with contact guard assist;standing Pt Will Transfer to Toilet: with contact guard assist;ambulating;bedside commode   OT Frequency:  Min 1X/week    Co-evaluation              AM-PAC OT "6 Clicks" Daily Activity     Outcome Measure Help from another person eating meals?: None Help from another person taking care of personal grooming?: A Little Help from another person toileting, which includes using toliet, bedpan, or urinal?: A Lot Help from another person bathing (including washing, rinsing, drying)?: A Lot Help from another person to put on and taking off regular upper body clothing?: A Little Help from another person to put on and taking off regular lower body clothing?:  Total 6 Click Score: 15   End of Session    Activity Tolerance: Patient tolerated treatment well Patient left: in bed;with call bell/phone within reach;with family/visitor present;with nursing/sitter in room  OT Visit Diagnosis: Other abnormalities of gait and mobility (R26.89)                Time: 1610-9604 OT Time Calculation (min): 68 min Charges:  OT General Charges $OT Visit: 1 Visit OT Evaluation $OT Eval Moderate Complexity: 1 Mod OT Treatments $Self Care/Home Management : 8-22 mins  Berna Spare, OTR/L Acute Rehabilitation Services Office: [phone]   Evern Bio 01/05/2024, 3:20 PM

## 2024-01-05 NOTE — Progress Notes (Addendum)
   Inpatient Rehab Admissions Coordinator :  Per therapy recommendations  and SW inquiry, patient was screened for CIR candidacy by Ottie Glazier RN MSN. Patient does not appear to demonstrate the medical neccesity for a Hospital Rehabilitation /CIR admit. I will not place a Rehab Consult. Recommend other Rehab Venues to be pursued. Please contact me with any questions. TOC SW, Enid Derry, is aware.  Ottie Glazier RN MSN Admissions Coordinator [phone]

## 2024-01-05 NOTE — ED Provider Notes (Signed)
 Emergency Medicine Observation Re-evaluation Note  Jordan [name] is a 83 y.o. male, seen on rounds today.  Pt initially presented to the ED for complaints of Fall Currently, the patient is NAD.  Physical Exam  BP 133/68   Pulse 68   Temp 97.9 F (36.6 C) (Oral)   Resp 18   Ht 5\' 8"  (1.727 m)   Wt 98.4 kg   SpO2 94%   BMI 32.98 kg/m  Physical Exam General: Appears to be resting comfortably in bed, no acute distress. Cardiac: Regular rate, normal heart rate, non-emergent blood pressure for this morning's vitals. Lungs: No increased work of breathing.  Equal chest rise appreciated Psych: Calm, asleep in bed.   ED Course / MDM  EKG:EKG Interpretation Date/Time:  Wednesday January 04 2024 15:47:08 EST Ventricular Rate:  69 PR Interval:  286 QRS Duration:  136 QT Interval:  422 QTC Calculation: 452 R Axis:   -53  Text Interpretation: Atrial-paced rhythm with prolonged AV conduction Right bundle branch block Left anterior fascicular block Bifascicular block Minimal voltage criteria for LVH, may be normal variant ( R in aVL ) Possible Lateral infarct , age undetermined Abnormal ECG Confirmed by Alvino Blood ([id number]) on 01/04/2024 4:21:48 PM  I have reviewed the labs performed to date as well as medications administered while in observation.  Recent changes in the last 24 hours include AMS.  Plan  Current plan is for [facility] consultation.    Glyn Ade, MD 01/05/24 [phone]

## 2024-01-05 NOTE — ED Notes (Signed)
 Patient removing CPAP (states it is uncomfortable) - sats 92% while asleep - placed on 2L [place]

## 2024-01-05 NOTE — Evaluation (Signed)
 Physical Therapy Evaluation Patient Details Name: Jordan [name] MRN: [id number] DOB: [birth year] Today's Date: 01/05/2024  History of Present Illness  Pt is 83 yo presenting to [facility] ED following two falls on 01/04/24  PMHx: PD, HLD, OSA, DM, HTN, chronic diastolic HF, CVA, frequent falls, L2 compression fx, L hip fx  Clinical Impression  Pt is presenting below baseline level of functioning. Currently pt is supervision to Min A for bed mobility, CGA for sit to stand and Min A for short distance gait with rolling walker. Pt has 24/7 assist from family at home and is motivated to improve and return home. Due to pt current functional status, home set up and available assistance at home recommending skilled physical therapy services > 3 hours/day in order to address strength, balance and functional mobility to decrease risk for falls, injury, immobility, skin break down and re-hospitalization.          If plan is discharge home, recommend the following: A little help with walking and/or transfers;Assistance with cooking/housework;Supervision due to cognitive status;Assist for transportation     Equipment Recommendations None recommended by PT  Recommendations for Other Services  Rehab consult    Functional Status Assessment Patient has had a recent decline in their functional status and demonstrates the ability to make significant improvements in function in a reasonable and predictable amount of time.     Precautions / Restrictions Precautions Precautions: Fall Restrictions Weight Bearing Restrictions Per Provider Order: No      Mobility  Bed Mobility Overal bed mobility: Needs Assistance Bed Mobility: Supine to Sit, Sit to Supine     Supine to sit: Contact guard Sit to supine: Min assist   General bed mobility comments: HOB elevated, use of rails and MIn A for LE to get to supine from sitting.    Transfers Overall transfer level: Needs assistance Equipment used: Rolling walker (2  wheels) Transfers: Sit to/from Stand Sit to Stand: Contact guard assist, From elevated surface           General transfer comment: CGA standing from stretcher. Verbal cues for hand placement.    Ambulation/Gait Ambulation/Gait assistance: Contact guard assist, Min assist Gait Distance (Feet): 40 Feet Assistive device: Rolling walker (2 wheels) Gait Pattern/deviations: Step-through pattern, Step-to pattern, Decreased step length - left, Decreased step length - right, Trunk flexed Gait velocity: decreased Gait velocity interpretation: <1.31 ft/sec, indicative of household ambulator   General Gait Details: very short step distance with occasional step to, takes significant increase in time, occasional buckling of the R knee requiring Min A for balance.      Balance Overall balance assessment: Needs assistance Sitting-balance support: Single extremity supported, Feet unsupported Sitting balance-Leahy Scale: Good     Standing balance support: Bilateral upper extremity supported, During functional activity, Reliant on assistive device for balance Standing balance-Leahy Scale: Poor Standing balance comment: Occasional Min A during gait due to buckling at R knee.       Pertinent Vitals/Pain Pain Assessment Pain Assessment: 0-10 Pain Score: 2  Pain Location: abdominal Pain Descriptors / Indicators: Aching Pain Intervention(s): Monitored during session, Limited activity within patient's tolerance    Home Living Family/patient expects to be discharged to:: Private residence Living Arrangements: Spouse/significant other;Children (son who has high functioning autism.) Available Help at Discharge: Family Type of Home: Other(Comment) (Condo) Home Access: Level entry;Elevator       Home Layout: One level Home Equipment: Agricultural consultant (2 wheels);Wheelchair - Manufacturing systems engineer;Other (comment);Grab bars - tub/shower;Grab bars - toilet  Additional Comments: lift chair    Prior  Function Prior Level of Function : Needs assist       Physical Assist : ADLs (physical);Mobility (physical)   ADLs (physical): Bathing;Dressing;IADLs;Toileting Mobility Comments: Pt ambulatory for short distances with RW and W/C for longer community distances. ADLs Comments: Wife assists with ADL"s pt states it is because it is faster but he is able to dress himself and don socks/shoes. Especially long pants take a long time.     Extremity/Trunk Assessment   Upper Extremity Assessment Upper Extremity Assessment: Defer to OT evaluation    Lower Extremity Assessment Lower Extremity Assessment: Generalized weakness    Cervical / Trunk Assessment Cervical / Trunk Assessment: Kyphotic  Communication   Communication Communication: No apparent difficulties    Cognition   Behavior During Therapy: WFL for tasks assessed/performed   PT - Cognitive impairments: History of cognitive impairments     PT - Cognition Comments: pt stated "you wouldn't believe whats going on in here; i've seen all kinds of things I think this place is haunted." Following commands: Intact       Cueing Cueing Techniques: Verbal cues     General Comments General comments (skin integrity, edema, etc.): No noted skin issues outside of gown. No signs/symptoms of cardiac/respiratory distress during session.        Assessment/Plan    PT Assessment Patient needs continued PT services  PT Problem List Decreased strength;Decreased balance;Decreased mobility       PT Treatment Interventions DME instruction;Therapeutic activities    PT Goals (Current goals can be found in the Care Plan section)  Acute Rehab PT Goals Patient Stated Goal: to improve mobility from where he is at now and live to 100 PT Goal Formulation: With patient Time For Goal Achievement: 01/19/24 Potential to Achieve Goals: Good    Frequency Min 2X/week        AM-PAC PT "6 Clicks" Mobility  Outcome Measure Help needed turning  from your back to your side while in a flat bed without using bedrails?: A Little Help needed moving from lying on your back to sitting on the side of a flat bed without using bedrails?: A Little Help needed moving to and from a bed to a chair (including a wheelchair)?: A Little Help needed standing up from a chair using your arms (e.g., wheelchair or bedside chair)?: A Little Help needed to walk in hospital room?: A Little Help needed climbing 3-5 steps with a railing? : A Lot 6 Click Score: 17    End of Session Equipment Utilized During Treatment: Gait belt Activity Tolerance: Patient tolerated treatment well Patient left: in bed;with call bell/phone within reach Nurse Communication: Mobility status PT Visit Diagnosis: Other abnormalities of gait and mobility (R26.89)    Time: 1010-1053 PT Time Calculation (min) (ACUTE ONLY): 43 min   Charges:   PT Evaluation $PT Eval Low Complexity: 1 Low PT Treatments $Therapeutic Activity: 23-37 mins PT General Charges $$ ACUTE PT VISIT: 1 Visit         Harrel Carina, DPT, CLT  Acute Rehabilitation Services Office: [phone] (Secure chat preferred)   Claudia Desanctis 01/05/2024, 11:19 AM

## 2024-01-06 ENCOUNTER — Ambulatory Visit: Payer: Medicare (Managed Care) | Admitting: Student

## 2024-01-06 NOTE — ED Provider Notes (Addendum)
 Patient transferred to rehab.  Discharge.   Virgina Norfolk, DO 01/06/24 1652    Virgina Norfolk, DO 01/06/24 [phone]

## 2024-01-06 NOTE — ED Notes (Signed)
 Called [facility]

## 2024-01-06 NOTE — ED Notes (Signed)
 Secretary was able to contact [facility] for ETA. ETS unknown but stated the patient was second on the list.

## 2024-01-06 NOTE — Progress Notes (Addendum)
 4:30pm: CSW received call from [facility] who states patient's insurance authorization has been approved. Talbert Forest states patient can be accepted at the facility today.  Patient will go to [facility] via [facility] - RN to call when ready. The number for report is [phone]. Please fax AVS to [phone].  10:10am: CSW spoke with Talbert Forest, admissions staf at [facility] who states she visited with patient at bedside and presented him with bed offer which was accepted. Talbert Forest states she will contact patient's wife for further discussion. Talbert Forest states she will initiate VA authorization and that patient can admit once authorization is obtained and a bed is available.  8am: CSW spoke with Corrie Dandy at [employer] who states patient is being worked up by a Clinical biochemist and they will be calling CSW once a decision is made.  Edwin Dada, MSW, LCSW Transitions of Care  Clinical Social Worker II [phone]

## 2024-01-06 NOTE — ED Notes (Signed)
 Patient repositioned and changed, noticed pressure injury to coccyx area stage one. Applied barrier cream after cleaning patient.

## 2024-01-06 NOTE — ED Notes (Signed)
 Called [facility] and gave report to Alvino Chapel RN,they are expecting patient at the facility.

## 2024-01-06 NOTE — ED Provider Notes (Addendum)
 Emergency Medicine Observation Re-evaluation Note  BRANDN MCGATH is a 83 y.o. male, seen on rounds today.  Pt initially presented to the ED for complaints hx Parkinsons, recent falls, currently boarding awaiting possible rehab vs  HH.  No new c/o this AM.   Physical Exam  BP (!) 179/82 (BP Location: Left Arm)   Pulse 64   Temp 97.7 F (36.5 C) (Oral)   Resp 19   Ht 1.727 m (5\' 8" )   Wt 98.4 kg   SpO2 95%   BMI 32.98 kg/m  Physical Exam General: resting.  Cardiac: regular rate.  Lungs: breathing comfortably.  ED Course / MDM   I have reviewed the labs performed to date as well as medications administered while in observation.  Recent changes in the last 24 hours include ED obs, reassessment.   Plan  Current plan is for TOC placement - dispo per TOC  team.   Pt has had PT eval.   Patient appears medically stable and can tolerate 3 hours or more a day of therapies at inpatient rehab if/when placement is secured.        Cathren Laine, MD 01/06/24 1038

## 2024-01-06 NOTE — ED Notes (Signed)
 Patient stated he wanted to wait for [facility] to take the ordered carbidopa/levodopa.

## 2024-01-06 NOTE — Care Management (Signed)
 Faxed AVS to [phone] at ED CSW request.  Called [facility] for transport to [facility].

## 2024-01-06 NOTE — ED Notes (Addendum)
 Report given to Ocie Doyne, RN at accepting facility [facility] ([facility]).

## 2024-01-07 DIAGNOSIS — G20A1 Parkinson's disease without dyskinesia, without mention of fluctuations: Secondary | ICD-10-CM | POA: Diagnosis not present

## 2024-01-07 DIAGNOSIS — R41840 Attention and concentration deficit: Secondary | ICD-10-CM | POA: Diagnosis not present

## 2024-01-07 DIAGNOSIS — E1165 Type 2 diabetes mellitus with hyperglycemia: Secondary | ICD-10-CM | POA: Diagnosis not present

## 2024-01-07 DIAGNOSIS — N209 Urinary calculus, unspecified: Secondary | ICD-10-CM | POA: Diagnosis not present

## 2024-01-07 DIAGNOSIS — N132 Hydronephrosis with renal and ureteral calculous obstruction: Secondary | ICD-10-CM | POA: Diagnosis not present

## 2024-01-08 DIAGNOSIS — R41840 Attention and concentration deficit: Secondary | ICD-10-CM | POA: Diagnosis not present

## 2024-01-08 DIAGNOSIS — N209 Urinary calculus, unspecified: Secondary | ICD-10-CM | POA: Diagnosis not present

## 2024-01-08 DIAGNOSIS — G20A1 Parkinson's disease without dyskinesia, without mention of fluctuations: Secondary | ICD-10-CM | POA: Diagnosis not present

## 2024-01-08 DIAGNOSIS — E1165 Type 2 diabetes mellitus with hyperglycemia: Secondary | ICD-10-CM | POA: Diagnosis not present

## 2024-01-08 DIAGNOSIS — N132 Hydronephrosis with renal and ureteral calculous obstruction: Secondary | ICD-10-CM | POA: Diagnosis not present

## 2024-01-09 ENCOUNTER — Other Ambulatory Visit: Payer: Self-pay

## 2024-01-09 ENCOUNTER — Encounter
Payer: Medicare (Managed Care) | Attending: Physical Medicine and Rehabilitation | Admitting: Physical Medicine and Rehabilitation

## 2024-01-09 DIAGNOSIS — G629 Polyneuropathy, unspecified: Secondary | ICD-10-CM | POA: Insufficient documentation

## 2024-01-09 DIAGNOSIS — N132 Hydronephrosis with renal and ureteral calculous obstruction: Secondary | ICD-10-CM | POA: Diagnosis not present

## 2024-01-09 DIAGNOSIS — N209 Urinary calculus, unspecified: Secondary | ICD-10-CM | POA: Diagnosis not present

## 2024-01-09 DIAGNOSIS — G20A1 Parkinson's disease without dyskinesia, without mention of fluctuations: Secondary | ICD-10-CM | POA: Diagnosis not present

## 2024-01-09 DIAGNOSIS — R41840 Attention and concentration deficit: Secondary | ICD-10-CM | POA: Diagnosis not present

## 2024-01-09 DIAGNOSIS — G8929 Other chronic pain: Secondary | ICD-10-CM | POA: Insufficient documentation

## 2024-01-09 DIAGNOSIS — M545 Low back pain, unspecified: Secondary | ICD-10-CM | POA: Insufficient documentation

## 2024-01-09 DIAGNOSIS — R5383 Other fatigue: Secondary | ICD-10-CM | POA: Insufficient documentation

## 2024-01-09 DIAGNOSIS — R29898 Other symptoms and signs involving the musculoskeletal system: Secondary | ICD-10-CM | POA: Insufficient documentation

## 2024-01-09 DIAGNOSIS — E1165 Type 2 diabetes mellitus with hyperglycemia: Secondary | ICD-10-CM | POA: Diagnosis not present

## 2024-01-09 NOTE — Progress Notes (Signed)
 Patient wife called in stating that he missed his March 5 CT CAP due to him being in the ER for multiple falls. He was transferred to [facility] for inpatient rehab. Wife doesn't know the discharge date but wanted to let us know. Will call the patients wife back if Dr Mosetta Putt wants to have the scan done after his discharge.

## 2024-01-10 ENCOUNTER — Ambulatory Visit: Payer: Medicare (Managed Care) | Admitting: Occupational Therapy

## 2024-01-10 ENCOUNTER — Ambulatory Visit: Payer: No Typology Code available for payment source | Admitting: Physical Therapy

## 2024-01-10 DIAGNOSIS — N132 Hydronephrosis with renal and ureteral calculous obstruction: Secondary | ICD-10-CM | POA: Diagnosis not present

## 2024-01-10 DIAGNOSIS — N209 Urinary calculus, unspecified: Secondary | ICD-10-CM | POA: Diagnosis not present

## 2024-01-10 DIAGNOSIS — E1165 Type 2 diabetes mellitus with hyperglycemia: Secondary | ICD-10-CM | POA: Diagnosis not present

## 2024-01-10 DIAGNOSIS — G20A1 Parkinson's disease without dyskinesia, without mention of fluctuations: Secondary | ICD-10-CM | POA: Diagnosis not present

## 2024-01-10 DIAGNOSIS — R41840 Attention and concentration deficit: Secondary | ICD-10-CM | POA: Diagnosis not present

## 2024-01-11 DIAGNOSIS — R41840 Attention and concentration deficit: Secondary | ICD-10-CM | POA: Diagnosis not present

## 2024-01-11 DIAGNOSIS — E1165 Type 2 diabetes mellitus with hyperglycemia: Secondary | ICD-10-CM | POA: Diagnosis not present

## 2024-01-11 DIAGNOSIS — N209 Urinary calculus, unspecified: Secondary | ICD-10-CM | POA: Diagnosis not present

## 2024-01-11 DIAGNOSIS — N132 Hydronephrosis with renal and ureteral calculous obstruction: Secondary | ICD-10-CM | POA: Diagnosis not present

## 2024-01-11 DIAGNOSIS — G20A1 Parkinson's disease without dyskinesia, without mention of fluctuations: Secondary | ICD-10-CM | POA: Diagnosis not present

## 2024-01-12 DIAGNOSIS — N209 Urinary calculus, unspecified: Secondary | ICD-10-CM | POA: Diagnosis not present

## 2024-01-12 DIAGNOSIS — G20A1 Parkinson's disease without dyskinesia, without mention of fluctuations: Secondary | ICD-10-CM | POA: Diagnosis not present

## 2024-01-12 DIAGNOSIS — E1165 Type 2 diabetes mellitus with hyperglycemia: Secondary | ICD-10-CM | POA: Diagnosis not present

## 2024-01-12 DIAGNOSIS — N132 Hydronephrosis with renal and ureteral calculous obstruction: Secondary | ICD-10-CM | POA: Diagnosis not present

## 2024-01-12 DIAGNOSIS — R41840 Attention and concentration deficit: Secondary | ICD-10-CM | POA: Diagnosis not present

## 2024-01-16 ENCOUNTER — Encounter: Payer: No Typology Code available for payment source | Admitting: Occupational Therapy

## 2024-01-16 ENCOUNTER — Ambulatory Visit: Payer: No Typology Code available for payment source | Admitting: Physical Therapy

## 2024-01-17 DIAGNOSIS — N132 Hydronephrosis with renal and ureteral calculous obstruction: Secondary | ICD-10-CM | POA: Diagnosis not present

## 2024-01-17 DIAGNOSIS — N209 Urinary calculus, unspecified: Secondary | ICD-10-CM | POA: Diagnosis not present

## 2024-01-17 DIAGNOSIS — E1165 Type 2 diabetes mellitus with hyperglycemia: Secondary | ICD-10-CM | POA: Diagnosis not present

## 2024-01-17 DIAGNOSIS — R41840 Attention and concentration deficit: Secondary | ICD-10-CM | POA: Diagnosis not present

## 2024-01-17 DIAGNOSIS — G20A1 Parkinson's disease without dyskinesia, without mention of fluctuations: Secondary | ICD-10-CM | POA: Diagnosis not present

## 2024-01-18 ENCOUNTER — Other Ambulatory Visit: Payer: Self-pay

## 2024-01-18 DIAGNOSIS — N132 Hydronephrosis with renal and ureteral calculous obstruction: Secondary | ICD-10-CM | POA: Diagnosis not present

## 2024-01-18 DIAGNOSIS — G20A1 Parkinson's disease without dyskinesia, without mention of fluctuations: Secondary | ICD-10-CM | POA: Diagnosis not present

## 2024-01-18 DIAGNOSIS — E1165 Type 2 diabetes mellitus with hyperglycemia: Secondary | ICD-10-CM | POA: Diagnosis not present

## 2024-01-18 DIAGNOSIS — N209 Urinary calculus, unspecified: Secondary | ICD-10-CM | POA: Diagnosis not present

## 2024-01-18 DIAGNOSIS — R41840 Attention and concentration deficit: Secondary | ICD-10-CM | POA: Diagnosis not present

## 2024-01-18 MED ORDER — QUETIAPINE FUMARATE 25 MG PO TABS: 25.0000 mg | ORAL_TABLET | Freq: Every day | ORAL | 1 refills | Status: DC

## 2024-01-19 ENCOUNTER — Other Ambulatory Visit: Payer: Self-pay

## 2024-01-19 DIAGNOSIS — N209 Urinary calculus, unspecified: Secondary | ICD-10-CM | POA: Diagnosis not present

## 2024-01-19 DIAGNOSIS — C7B8 Other secondary neuroendocrine tumors: Secondary | ICD-10-CM

## 2024-01-19 DIAGNOSIS — J80 Acute respiratory distress syndrome: Secondary | ICD-10-CM | POA: Diagnosis not present

## 2024-01-19 DIAGNOSIS — R41840 Attention and concentration deficit: Secondary | ICD-10-CM | POA: Diagnosis not present

## 2024-01-19 DIAGNOSIS — E1165 Type 2 diabetes mellitus with hyperglycemia: Secondary | ICD-10-CM | POA: Diagnosis not present

## 2024-01-19 DIAGNOSIS — G20A1 Parkinson's disease without dyskinesia, without mention of fluctuations: Secondary | ICD-10-CM | POA: Diagnosis not present

## 2024-01-19 DIAGNOSIS — N132 Hydronephrosis with renal and ureteral calculous obstruction: Secondary | ICD-10-CM | POA: Diagnosis not present

## 2024-01-19 DIAGNOSIS — N281 Cyst of kidney, acquired: Secondary | ICD-10-CM | POA: Diagnosis not present

## 2024-01-20 DIAGNOSIS — G20A1 Parkinson's disease without dyskinesia, without mention of fluctuations: Secondary | ICD-10-CM | POA: Diagnosis not present

## 2024-01-20 DIAGNOSIS — N209 Urinary calculus, unspecified: Secondary | ICD-10-CM | POA: Diagnosis not present

## 2024-01-20 DIAGNOSIS — E1165 Type 2 diabetes mellitus with hyperglycemia: Secondary | ICD-10-CM | POA: Diagnosis not present

## 2024-01-20 DIAGNOSIS — N132 Hydronephrosis with renal and ureteral calculous obstruction: Secondary | ICD-10-CM | POA: Diagnosis not present

## 2024-01-20 DIAGNOSIS — R41840 Attention and concentration deficit: Secondary | ICD-10-CM | POA: Diagnosis not present

## 2024-01-23 ENCOUNTER — Encounter (HOSPITAL_COMMUNITY): Payer: Self-pay | Admitting: *Deleted

## 2024-01-23 ENCOUNTER — Emergency Department (HOSPITAL_COMMUNITY)
Admission: EM | Admit: 2024-01-23 | Discharge: 2024-01-23 | Disposition: A | Discharge status: Home / Self Care | Attending: Emergency Medicine | Admitting: Emergency Medicine

## 2024-01-23 ENCOUNTER — Other Ambulatory Visit: Payer: Self-pay

## 2024-01-23 ENCOUNTER — Encounter: Payer: No Typology Code available for payment source | Admitting: Occupational Therapy

## 2024-01-23 ENCOUNTER — Ambulatory Visit: Payer: No Typology Code available for payment source | Admitting: Physical Therapy

## 2024-01-23 ENCOUNTER — Emergency Department (HOSPITAL_COMMUNITY)

## 2024-01-23 DIAGNOSIS — N3001 Acute cystitis with hematuria: Secondary | ICD-10-CM | POA: Diagnosis not present

## 2024-01-23 DIAGNOSIS — I509 Heart failure, unspecified: Secondary | ICD-10-CM | POA: Insufficient documentation

## 2024-01-23 DIAGNOSIS — G20C Parkinsonism, unspecified: Secondary | ICD-10-CM | POA: Insufficient documentation

## 2024-01-23 DIAGNOSIS — R531 Weakness: Secondary | ICD-10-CM | POA: Diagnosis not present

## 2024-01-23 DIAGNOSIS — R5383 Other fatigue: Secondary | ICD-10-CM | POA: Diagnosis present

## 2024-01-23 DIAGNOSIS — I10 Essential (primary) hypertension: Secondary | ICD-10-CM | POA: Diagnosis not present

## 2024-01-23 DIAGNOSIS — Z7901 Long term (current) use of anticoagulants: Secondary | ICD-10-CM | POA: Insufficient documentation

## 2024-01-23 LAB — COMPREHENSIVE METABOLIC PANEL
ALT: 9 U/L (ref 0–44)
AST: 29 U/L (ref 15–41)
Albumin: 3.2 g/dL — ABNORMAL LOW (ref 3.5–5.0)
Alkaline Phosphatase: 63 U/L (ref 38–126)
Anion gap: 13 (ref 5–15)
BUN: 15 mg/dL (ref 8–23)
CO2: 24 mmol/L (ref 22–32)
Calcium: 9.8 mg/dL (ref 8.9–10.3)
Chloride: 103 mmol/L (ref 98–111)
Creatinine, Ser: 0.99 mg/dL (ref 0.61–1.24)
GFR, Estimated: >60 mL/min (ref >60)
Glucose, Bld: 165 mg/dL — ABNORMAL HIGH (ref 70–99)
Potassium: 3.7 mmol/L (ref 3.5–5.1)
Sodium: 140 mmol/L (ref 135–145)
Total Bilirubin: 0.5 mg/dL (ref 0.0–1.2)
Total Protein: 7.1 g/dL (ref 6.5–8.1)

## 2024-01-23 LAB — RESP PANEL BY RT-PCR (RSV, FLU A&B, COVID)  RVPGX2
Influenza A by PCR: NEGATIVE
Influenza B by PCR: NEGATIVE
Resp Syncytial Virus by PCR: NEGATIVE
SARS Coronavirus 2 by RT PCR: NEGATIVE

## 2024-01-23 LAB — URINALYSIS, ROUTINE W REFLEX MICROSCOPIC

## 2024-01-23 LAB — CBC WITH DIFFERENTIAL/PLATELET
Abs Immature Granulocytes: 0.04 10*3/uL (ref 0.00–0.07)
Basophils Absolute: 0.1 10*3/uL (ref 0.0–0.1)
Basophils Relative: 1 %
Eosinophils Absolute: 0.3 10*3/uL (ref 0.0–0.5)
Eosinophils Relative: 3 %
HCT: 34.9 % — ABNORMAL LOW (ref 39.0–52.0)
Hemoglobin: 10.6 g/dL — ABNORMAL LOW (ref 13.0–17.0)
Immature Granulocytes: 1 %
Lymphocytes Relative: 31 %
Lymphs Abs: 2.6 10*3/uL (ref 0.7–4.0)
MCH: 25.2 pg — ABNORMAL LOW (ref 26.0–34.0)
MCHC: 30.4 g/dL (ref 30.0–36.0)
MCV: 82.9 fL (ref 80.0–100.0)
Monocytes Absolute: 1 10*3/uL (ref 0.1–1.0)
Monocytes Relative: 12 %
Neutro Abs: 4.4 10*3/uL (ref 1.7–7.7)
Neutrophils Relative %: 52 %
Platelets: 300 10*3/uL (ref 150–400)
RBC: 4.21 MIL/uL — ABNORMAL LOW (ref 4.22–5.81)
RDW: 16.5 % — ABNORMAL HIGH (ref 11.5–15.5)
WBC: 8.4 10*3/uL (ref 4.0–10.5)
nRBC: 0 % (ref 0.0–0.2)

## 2024-01-23 LAB — URINALYSIS, MICROSCOPIC (REFLEX): RBC / HPF: >50 RBC/hpf (ref 0–5)

## 2024-01-23 LAB — TROPONIN I (HIGH SENSITIVITY): Troponin I (High Sensitivity): 10 ng/L (ref <18)

## 2024-01-23 MED ORDER — SODIUM CHLORIDE 0.9 % IV SOLN
1.0000 g | Freq: Once | INTRAVENOUS | Status: AC
  Administered 2024-01-23: 1 g via INTRAVENOUS
  Filled 2024-01-23: qty 10

## 2024-01-23 MED ORDER — SODIUM CHLORIDE 0.9 % IV BOLUS
1000.0000 mL | Freq: Once | INTRAVENOUS | Status: AC
  Administered 2024-01-23: 1000 mL via INTRAVENOUS

## 2024-01-23 MED ORDER — FOSFOMYCIN TROMETHAMINE 3 G PO PACK
3.0000 g | PACK | Freq: Once | ORAL | Status: AC
  Administered 2024-01-23: 3 g via ORAL
  Filled 2024-01-23: qty 3

## 2024-01-23 NOTE — ED Notes (Signed)
Not in room, taken to xray.

## 2024-01-23 NOTE — ED Notes (Signed)
 U/t access pt pacemaker d/t brand device isn't available. EDP aware

## 2024-01-23 NOTE — ED Triage Notes (Signed)
 BIB [facility] from home for lethargy, fatigue, and "zones out", onset of noticeable change was yesterday. Seen recently in [facility] with w/u for infection, reports "no s/sx of infection found". H/o parkinsons. Wife states "he was tire last night which isn't like him". VSS. 130/94, HR 65, RR 18, afebrile. SPO2 95% RA. H/o DM, BS 199. Denies syncope or sz activity. Denies pain. LS CTA. EKG unremarkable, has pacemaker. Wife is primary caregiver, not present at time of this note or on arrival. Pt alert, NAD, calm, interactive, appropriate, pale, speech clear, A&O.

## 2024-01-23 NOTE — ED Provider Notes (Signed)
 [place] EMERGENCY DEPARTMENT AT [facility] Provider Note   CSN: [id number] Arrival date & time: 01/23/24  1619     History  Chief Complaint  Patient presents with   Fatigue    Jordan [name] is a 83 y.o. male, CHF, Parkinson's, narcolepsy, who presents to the ED secondary to not feeling well, for the last few days.  States he was recently admitted, to the hospital, for fatigue, to rule out any kind of infection, they cannot find a source of infection, and patient has been a little bit tired since then.  Wife called 911, because he was more tired than usual last night.  Has not any fevers, symptoms.  Denies any chest pain, shortness of breath, nausea, vomiting, or abdominal pain.  He states he feels great today.  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  Home Medications Prior to Admission medications   Medication Sig Start Date End Date Taking? Authorizing Provider  acetaminophen (TYLENOL) 500 MG tablet Take 500-1,000 mg by mouth every 6 (six) hours as needed for moderate pain or mild pain.    [provider]  atorvastatin (LIPITOR) 40 MG tablet Take 0.5 tablets (20 mg total) by mouth daily. Patient taking differently: Take 20 mg by mouth at bedtime. 07/30/21   Medina-Vargas, Monina C, NP  b complex vitamins capsule Take 1 capsule by mouth at bedtime.    [provider]  bifidobacterium infantis (ALIGN) capsule Take 1 capsule by mouth daily.    [provider]  Camphor-Menthol-Methyl Sal (HM SALONPAS PAIN RELIEF EX) Apply 1 patch topically daily as needed (Back pain).    [provider]  carbidopa-levodopa (SINEMET CR) 50-200 MG tablet Take 1 tablet by mouth at bedtime. 08/17/23   Ihor Austin, NP  carbidopa-levodopa (SINEMET IR) 25-100 MG tablet Take 2 tablets by mouth 3 (three) times daily. 12/29/23   Ihor Austin, NP   Cholecalciferol (VITAMIN D) 50 MCG (2000 UT) CAPS Take 2,000 Units by mouth at bedtime.    [provider]  clotrimazole-betamethasone (LOTRISONE) cream Apply 1 Application topically daily as needed (Rash). 06/21/22   [provider]  Cyanocobalamin 5000 MCG LOZG Take 1 lozenge by mouth daily.    [provider]  docusate sodium (COLACE) 100 MG capsule Take 1 capsule (100 mg total) by mouth 2 (two) times daily. Patient taking differently: Take 100 mg by mouth daily as needed for mild constipation or moderate constipation. As Needed 02/17/22   Kathlen Mody, MD  feeding supplement, GLUCERNA SHAKE, (GLUCERNA SHAKE) LIQD Take 237 mLs by mouth 2 (two) times daily between meals. Alternate with ensure Diabetic    [provider]  finasteride (PROSCAR) 5 MG tablet Take 1 tablet by mouth at bedtime. 12/01/23   [provider]  furosemide (LASIX) 20 MG tablet USE AS NEEDED FOR UP TO 5 DAYS IF SWELLING IN LEGS RECURS. CONTACT CARDIOLOGY IS SWELLING DOES NOT IMPROVE OR GETS WORSE. 01/26/22   Raulkar, Drema Pry, MD  lidocaine (LIDODERM) 5 % Place 1 patch onto the skin daily as needed. Purchase over the counter. On for 12 hours and off for 12 hours 01/18/22   Love, Evlyn Kanner, PA-C  magnesium gluconate (MAGONATE) 500 MG tablet Take 0.5 tablets (250 mg total) by mouth at bedtime. 03/24/22   Raulkar, Drema Pry, MD  Melatonin 10 MG TABS Take 10 mg by mouth at  bedtime.    [provider]  metFORMIN (GLUCOPHAGE) 500 MG tablet Take 1,000 mg by mouth 2 (two) times daily. 1000 mg in the am and 1000 mg at night    [provider]  modafinil (PROVIGIL) 200 MG tablet TAKE 1 TABLET BY MOUTH EVERY DAY 12/12/23   Waymon Budge, MD  Multiple Vitamin (MULTIVITAMIN WITH MINERALS) TABS tablet Take 1 tablet by mouth daily. 02/19/18   Alwyn Ren, MD  NITROSTAT 0.4 MG SL tablet PLACE 1 TABLET (0.4 MG TOTAL) UNDER THE TONGUE EVERY 5 (FIVE) MINUTES AS NEEDED FOR CHEST  PAIN. 03/31/16   Lyn Records, MD  Omega-3 Fatty Acids (OMEGA 3 PO) Take 1,280 mg by mouth at bedtime.    [provider]  polyethylene glycol (MIRALAX / GLYCOLAX) 17 g packet Take 17 g by mouth daily. Patient taking differently: Take 17 g by mouth daily as needed for mild constipation. 01/09/22   Lanae Boast, MD  polyvinyl alcohol (LIQUIFILM TEARS) 1.4 % ophthalmic solution Place 1 drop into both eyes as needed for dry eyes. Patient taking differently: Place 1 drop into both eyes daily as needed for dry eyes. 01/18/22   Love, Evlyn Kanner, PA-C  QUEtiapine (SEROQUEL) 25 MG tablet Take 1 tablet (25 mg total) by mouth at bedtime. 01/18/24   Raulkar, Drema Pry, MD  rivaroxaban (XARELTO) 20 MG TABS tablet Take 1 tablet (20 mg total) by mouth daily. 01/02/24   Carlean Jews, NP  sitaGLIPtin (JANUVIA) 50 MG tablet Take 50 mg by mouth daily.    [provider]  Vitamin D, Ergocalciferol, (DRISDOL) 1.25 MG (50000 UNIT) CAPS capsule TAKE 1 CAPSULE (50,000 UNITS TOTAL) BY MOUTH EVERY 7 (SEVEN) DAYS 05/06/22   Raulkar, Drema Pry, MD      Allergies    Lisinopril, Amantadines, Clonazepam, and Zoloft [sertraline]    Review of Systems   Review of Systems  Constitutional:  Positive for fatigue. Negative for fever.    Physical Exam Updated Vital Signs BP 119/78   Pulse 71   Temp 98.1 F (36.7 C)   Resp 20   Wt 98 kg   SpO2 100%   BMI 32.84 kg/m  Physical Exam Vitals and nursing note reviewed.  Constitutional:      General: He is not in acute distress.    Appearance: He is well-developed.  HENT:     Head: Normocephalic and atraumatic.  Eyes:     Conjunctiva/sclera: Conjunctivae normal.  Cardiovascular:     Rate and Rhythm: Normal rate and regular rhythm.     Heart sounds: No murmur heard. Pulmonary:     Effort: Pulmonary effort is normal. No respiratory distress.     Breath sounds: Normal breath sounds.  Abdominal:     Palpations: Abdomen is soft.     Tenderness: There is no  abdominal tenderness.  Musculoskeletal:        General: No swelling.     Cervical back: Neck supple.  Skin:    General: Skin is warm and dry.     Capillary Refill: Capillary refill takes less than 2 seconds.  Neurological:     Mental Status: He is alert.  Psychiatric:        Mood and Affect: Mood normal.     ED Results / Procedures / Treatments   Labs (all labs ordered are listed, but only abnormal results are displayed) Labs Reviewed  CBC WITH DIFFERENTIAL/PLATELET - Abnormal; Notable for the following components:  Result Value   RBC 4.21 (*)    Hemoglobin 10.6 (*)    HCT 34.9 (*)    MCH 25.2 (*)    RDW 16.5 (*)    All other components within normal limits  COMPREHENSIVE METABOLIC PANEL - Abnormal; Notable for the following components:   Glucose, Bld 165 (*)    Albumin 3.2 (*)    All other components within normal limits  URINALYSIS, ROUTINE W REFLEX MICROSCOPIC - Abnormal; Notable for the following components:   Color, Urine BROWN (*)    APPearance TURBID (*)    Glucose, UA   (*)    Value: TEST NOT REPORTED DUE TO COLOR INTERFERENCE OF URINE PIGMENT   Hgb urine dipstick   (*)    Value: TEST NOT REPORTED DUE TO COLOR INTERFERENCE OF URINE PIGMENT   Bilirubin Urine   (*)    Value: TEST NOT REPORTED DUE TO COLOR INTERFERENCE OF URINE PIGMENT   Ketones, ur   (*)    Value: TEST NOT REPORTED DUE TO COLOR INTERFERENCE OF URINE PIGMENT   Protein, ur   (*)    Value: TEST NOT REPORTED DUE TO COLOR INTERFERENCE OF URINE PIGMENT   Nitrite   (*)    Value: TEST NOT REPORTED DUE TO COLOR INTERFERENCE OF URINE PIGMENT   Leukocytes,Ua   (*)    Value: TEST NOT REPORTED DUE TO COLOR INTERFERENCE OF URINE PIGMENT   All other components within normal limits  URINALYSIS, MICROSCOPIC (REFLEX) - Abnormal; Notable for the following components:   Bacteria, UA FEW (*)    All other components within normal limits  RESP PANEL BY RT-PCR (RSV, FLU A&B, COVID)  [id number]  TROPONIN I (HIGH  SENSITIVITY)    EKG None  Radiology DG Chest 2 View Result Date: 01/23/2024 CLINICAL DATA:  Fatigue EXAM: CHEST - 2 VIEW COMPARISON:  Chest x-ray 01/04/2024 FINDINGS: 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 The heart size and mediastinal contours are within normal limits. Both lungs are clear. The visualized skeletal structures are unremarkable. IMPRESSION: No active cardiopulmonary disease. Electronically Signed   By: Darliss Cheney M.D.   On: 01/23/2024 19:30    Procedures Procedures    Medications Ordered in ED Medications  sodium chloride 0.9 % bolus 1,000 mL (0 mLs Intravenous Stopped 01/23/24 2004)  cefTRIAXone (ROCEPHIN) 1 g in sodium chloride 0.9 % 100 mL IVPB (1 g Intravenous New Bag/Given 01/23/24 2026)    ED Course/ Medical Decision Making/ A&P                                 Medical Decision Making Patient is a 83 year old male, here for fatigue that happened last night, wife at bedside states he has been feeling more fatigued, and is concerned about him.  Stating that this is not normal for him.  He has a history of Parkinson's and narcolepsy.  We will obtain chest x-ray, blood work, urinalysis for further evaluation as well as troponins, given his age.  Overall well-appearing with no focal deficits on my exam  Amount and/or Complexity of Data Reviewed Labs: ordered.    Details: Urinalysis, concerning for possible UTI with few bacteria, and white blood cells Radiology: ordered.    Details: Chest x-ray clear Discussion of management or test interpretation with external provider(s): Patient does have findings concerning for UTI, on his scan, several days ago, he had obstructing stones.  We are repeating a CT renal, to  check to see if obstructing stone, to see if septic stone.  Dr. Posey Rea will follow-up on the results of this.  Started on ceftriaxone, for UTI treatment.    Final Clinical Impression(s) / ED Diagnoses Final diagnoses:  Acute cystitis with hematuria  Other  fatigue    Rx / DC Orders ED Discharge Orders     None         Pete Pelt, PA 01/23/24 2206    Glendora Score, MD 01/26/24 1345    Glendora Score, MD 01/26/24 1347

## 2024-01-23 NOTE — ED Notes (Signed)
 Pt provided with sandwich bag, apple juice, strawberry ice cream, graham crackers, and peanut butter.

## 2024-01-25 DIAGNOSIS — N200 Calculus of kidney: Secondary | ICD-10-CM | POA: Diagnosis not present

## 2024-01-25 DIAGNOSIS — E1169 Type 2 diabetes mellitus with other specified complication: Secondary | ICD-10-CM | POA: Diagnosis not present

## 2024-01-25 DIAGNOSIS — G20C Parkinsonism, unspecified: Secondary | ICD-10-CM | POA: Diagnosis not present

## 2024-01-25 DIAGNOSIS — D6489 Other specified anemias: Secondary | ICD-10-CM | POA: Diagnosis not present

## 2024-01-30 ENCOUNTER — Ambulatory Visit: Payer: Medicare (Managed Care)

## 2024-01-30 ENCOUNTER — Encounter: Payer: No Typology Code available for payment source | Admitting: Occupational Therapy

## 2024-01-30 ENCOUNTER — Ambulatory Visit: Payer: No Typology Code available for payment source | Admitting: Physical Therapy

## 2024-01-30 DIAGNOSIS — I495 Sick sinus syndrome: Secondary | ICD-10-CM | POA: Diagnosis not present

## 2024-02-01 LAB — CUP PACEART REMOTE DEVICE CHECK
Battery Voltage: 40
Date Time Interrogation Session: 20250331112222
Implantable Lead Connection Status: 753985
Implantable Lead Connection Status: 753985
Implantable Lead Implant Date: 20170525
Implantable Lead Implant Date: 20170525
Implantable Lead Location: 753859
Implantable Lead Location: 753860
Implantable Lead Model: 377
Implantable Lead Model: 377
Implantable Lead Serial Number: 49470224
Implantable Lead Serial Number: 49485613
Implantable Pulse Generator Implant Date: 20170525
Pulse Gen Model: 394969
Pulse Gen Serial Number: 68798589

## 2024-02-02 ENCOUNTER — Ambulatory Visit (HOSPITAL_COMMUNITY)
Admission: RE | Admit: 2024-02-02 | Discharge: 2024-02-02 | Disposition: A | Discharge status: Home / Self Care | Payer: Medicare (Managed Care) | Source: Ambulatory Visit | Attending: Nurse Practitioner | Admitting: Nurse Practitioner

## 2024-02-02 ENCOUNTER — Encounter: Payer: No Typology Code available for payment source | Admitting: Speech Pathology

## 2024-02-02 ENCOUNTER — Ambulatory Visit: Payer: No Typology Code available for payment source | Admitting: Physical Therapy

## 2024-02-02 DIAGNOSIS — N281 Cyst of kidney, acquired: Secondary | ICD-10-CM | POA: Insufficient documentation

## 2024-02-02 DIAGNOSIS — K8689 Other specified diseases of pancreas: Secondary | ICD-10-CM | POA: Diagnosis not present

## 2024-02-02 DIAGNOSIS — C7A8 Other malignant neuroendocrine tumors: Secondary | ICD-10-CM | POA: Diagnosis not present

## 2024-02-02 DIAGNOSIS — C7B8 Other secondary neuroendocrine tumors: Secondary | ICD-10-CM | POA: Insufficient documentation

## 2024-02-02 DIAGNOSIS — N200 Calculus of kidney: Secondary | ICD-10-CM | POA: Insufficient documentation

## 2024-02-02 DIAGNOSIS — K5730 Diverticulosis of large intestine without perforation or abscess without bleeding: Secondary | ICD-10-CM | POA: Insufficient documentation

## 2024-02-02 MED ORDER — IOHEXOL 300 MG/ML  SOLN
100.0000 mL | Freq: Once | INTRAMUSCULAR | Status: AC | PRN
  Administered 2024-02-02: 100 mL via INTRAVENOUS

## 2024-02-02 MED ORDER — SODIUM CHLORIDE (PF) 0.9 % IJ SOLN
INTRAMUSCULAR | Status: AC
Start: 2024-02-02 — End: ?
  Filled 2024-02-02: qty 50

## 2024-02-05 ENCOUNTER — Encounter: Payer: Self-pay | Admitting: Internal Medicine

## 2024-02-06 ENCOUNTER — Encounter: Payer: Medicare (Managed Care) | Admitting: Physician Assistant

## 2024-02-06 ENCOUNTER — Other Ambulatory Visit: Payer: Self-pay

## 2024-02-06 ENCOUNTER — Encounter: Payer: Self-pay | Admitting: Adult Health

## 2024-02-06 ENCOUNTER — Ambulatory Visit: Payer: Medicare (Managed Care) | Admitting: Adult Health

## 2024-02-06 VITALS — BP 100/69 | HR 78 | Ht 69.0 in | Wt 212.0 lb

## 2024-02-06 DIAGNOSIS — G214 Vascular parkinsonism: Secondary | ICD-10-CM

## 2024-02-06 DIAGNOSIS — R296 Repeated falls: Secondary | ICD-10-CM | POA: Diagnosis not present

## 2024-02-06 DIAGNOSIS — I6381 Other cerebral infarction due to occlusion or stenosis of small artery: Secondary | ICD-10-CM | POA: Diagnosis not present

## 2024-02-06 DIAGNOSIS — G459 Transient cerebral ischemic attack, unspecified: Secondary | ICD-10-CM

## 2024-02-06 NOTE — Assessment & Plan Note (Signed)
 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 CT AP on 06/17/22 showed: calcified mesenteric mass with suspected associated small bowel mass; suspicious right hepatic lesion; nodular enhancement along wall of bowel loop in central abdomen.  -abdomen MRI 06/23/22 showed: 3.9 cm mass in right hepatic lobe; small RLQ mesenteric mass or lymphadenopathy. 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 -PET DOTATATE from 08/12/22 showed avid disease within the liver, mesentery and small bowel. 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 He tolerated well with no noticeable side effects.  -restaging CT in 01/2024 showed stable disease

## 2024-02-06 NOTE — Progress Notes (Signed)
 PATIENT: Jordan [name] DOB: 01-02-1941  REASON FOR VISIT: vascular parkinsonism f/u HISTORY FROM: patient and wife PRIMARY NEUROLOGIST: Dr. 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      HISTORY OF PRESENT ILLNESS:  Update 02/06/2024 JM: Patient returns for sooner scheduled visit due to recent hospitalizations.  He was seen in ED on 3/5 after a recurrent fall at home.  Wife reports that night prior, he was ambulating to the bedroom, legs started to jerk and then fell. EMS assisted patient up. He had similar episode occur the following morning therefore wife requested EMS transfer to ED for evaluation.  Workup largely unremarkable except mild dehydration.  He was transferred to inpatient rehab at [facility].  He was discharged home on 3/22.  Return to ED on 3/24 for lethargy possibly in setting of UTI and treated with antibiotics and recommended outpatient urology follow-up.  He is currently receiving home health PT/OT through the [place].  He continues to have difficulty with standing, feels it is primarily his left leg that will jerk, sometimes able to catch himself but other times will result in a fall.  He does have chronic low back pain, can have low back pain with jerk movement, unsure if he has low back pain prior to leg jerk or after.  Usually more prevalent in the evening when he is fatigued.  Due to this, ambulation has been limited. Wife concerned that these episodes could be from side effect of Seroquel and would be interested in tapering off and possibly switching to Nuplazid as he was initially started on Seroquel for hallucinations.  Sinemet dosage increased at prior visit on 2/27 from 1.5 tabs TID and 2 tabs TID, also remains on Sinemet CR 1 tab nightly.  She is unsure if his falls have been more prevalent since that time.  Blood pressure monitored at home which has been stable but  today on lower side at 100/69. Has not been able to check with standing or after falls.  No seizure like activity with falls, does not lose consciousness.  She is also concerned regarding gradual decline in cognition and question starting Aricept.     History provided for reference purposes only Update 12/29/2023 JM: Patient returns for sooner scheduled visit for hospital follow-up accompanied by his wife.  He presented to [facility] ED on 2/20 with transient aphasia lasting 5 to 10 minutes. CTH no acute abnormalities. CTA negative LVO.  Unable to obtain MRI due to pacer.  No medication changes recommended, remained on Xarelto and atorvastatin.  Diagnosed with TIA.  Patient was discharged after 3 hours.  Has been stable since that time without any new or reoccurring stroke/TIA symptoms. When symptoms present, he knew what he wanted to say but what came out as garbled and slurred, sounded more like sounds and not words per wife.  He has remained on Xarelto and atorvastatin.  Wife mentions oncology lowering Xarelto dosage from 20 mg to 15 mg daily about 1 year ago due to hematuria with anemia possibly due to kidney stones, she questions if this dose should go back to 20mg  daily, he takes Xarelto for hx of DVT.  He does report follow-up visit with PCP on 2/10 and had lab work completed including lipid panel and A1c (unable to view via epic).    In regards to Parkinson's, believes he continues  to decline.  He has had more difficulty ambulating and primarily transfers via wheelchair.  He is able to walk very short distance with rolling walker.  Wife believes he has been moving slower and has increased stiffness.  He also complains of some blurred vision as well as occasional double vision.  He has had 2 falls due to legs giving out recently but thankfully without injury or hitting head.  He remains on Sinemet IR 1.5 tabs 3 times daily but schedule is not consistent as sometimes it can be difficult to take medications  on time if he is sleeping or if it is around mealtime, she ensures he does not take sooner than 4-5 hours apart.  Typically, wife tries to give him Sinemet around 930, between 1 to 2 PM and between 6 to 7 PM.  He will take his Sinemet CR anywhere from 10 PM to 2 AM (just prior to going to bed).  He will have significantly increased stiffness if he misses his CR dosage.  He is currently tolerating Sinemet well without side effects.  He denies any wearing off effects prior to next dosage.  Update 08/17/2023 JM: Patient returns for 41-month follow-up accompanied by his wife. Overall stable since prior visit. Denies any progression of symptoms. Continues on Sinemet 1.5 tabs TID (first dose around 9:30-10am and then every 4-5 hours) and Sinemet CR 1 tab at bedtime, denies side effects. Will have worsening symptoms if he goes past that 4-5 hour mark. Gait is shuffled, ambulates with RW short distance, w/c long distance, no recent falls. Completed therapies back in July, recommended 3 mo f/u for PD re-evaluation but unable to be scheduled for return visit until end of Jan. Does have issues with constipation, will use colax and/or miralax as needed, does drink some fluids during the day but does not drink straight water, reports this gives him heart burn. Has difficulty swallowing large pills, takes with applesauce, avoids chewier foods more due to dental reasons, denies any worsening of swallowing.  No further questions or concerns at this time.  Update 02/15/2023 MM: Jordan [name] is a 83 y.o. male who has been followed in this office for vascular parkinsons. Returns today for follow-up. Here today with his wife. Reports that this AM he felt weak in the lower legs. Does not drink much water. Issues with constipation- doesn't want to take a stool softner daily d/t fear of having an accident. Some trouble swallowing but not consistent. Some trouble with pills but now uses applesauce and that works better. Uses a walker  at home and wheelchair when out and about.  Reports that he was having vivid dreams but Seroquel has helped with that.  He typically goes to bed around 11 PM and wakes up around 11 AM.  His first dose of Sinemet is around 10:30 AM, 230 and 630.  He takes Sinemet CR around 9 PM.  Wife knows that he has some trouble with leg weakness in the late afternoons.  She is wondering if Sinemet IR should be increased  Update 11/23/2022 Dr. Pearlean Brownie: He returns for follow-up after last visit 3 months ago.  He is accompanied by his wife.  They have noticed slight improvement after increasing the Sinemet dose to 25/100 to 1 and half tablets 3 times daily.  He has noticed improvement in his mobility and does not feel as weak or fatigued.  He denies any increased side effects in the form of nausea, dizziness but does have mild sleepiness.  He denies any delusions or hallucinations.  Still needs help with many activities of daily living but he can feed himself and clean himself In the toilet.  He complains of fatigability and he does use his CPAP regularly but has a appointment coming up to see Dr. Maple Hudson denies any recurrent symptoms of stroke TIA.  His blood pressure tends to be on the low side and he denies he is short distances.  He does agree to some slight increase in drooling particularly in the morning.  He remains on Xarelto for his DVT and is tolerating it well without bruising or bleeding.  Sleep well most of the night.  Update 08/19/22 Dr. Pearlean Brownie: He returns for follow-up after last visit with Shanda Bumps nurse practitioner 1 month ago.  He is accompanied by his wife.  She states that for the last week or so she has noticed worsening of his parkinsonian symptoms.  He appears extremely fatigued and weak.  He has trouble walking down the hallway from the bedroom to the living room and has to stop.  At times he has difficult time getting started with any walks.  Walks with increased shuffling and small steps.  He has mild  intermittent tremor but it is not as bothersome.  Patient had been on amantadine 200 mg daily but he was having some hallucinations so this was discontinued a week or 2 ago and may have contributed to his worsening.  He is currently on Sinemet IR 25/101 tablet 3 times daily and Sinemet CR 25/101 tablet at bedtime.  He seems to be tolerating these medications fairly well without significant dizziness, sleepiness or other side effects.  He has had no recent stroke or TIA symptoms.  He remains on Xarelto for DVT treatment.  Update 07/13/2022 JM: Patient returns for follow up visit accompanied by his wife   He has been relatively stable from neurological standpoint over the past few months.  He has remained on Sinemet 1 tab 3 times daily and Sinemet CR 1 tab nightly. Tolerating without side effects. Does have some worsening stiffness and slowness pursing in the morning and in the evening/bedtime. Can have occasional difficulty with right leg hesitancy/coordination especially in the evening. Use of RW for short distance ambulation otherwise use of w/c.  Recently completed outpatient therapies.  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  He does try to maintain some ADLs independently but does need some assistance by his wife.  He does fatigue quickly after some ADLs such as bathing or dressing.  Previously having issues with visual hallucinations but these have greatly subsided since starting Seroquel 12.5 mg nightly, does still have some occasional visual hallucinations but nonthreatening.  Does have frequent talking and acting out (punching and kicking) while sleeping.  He is on melatonin 10 mg nightly.  Admits to limited daytime physical activity, he usually sits and watches TV during the day, can fall asleep easily in chair unless watching something that keeps his interest. 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 Maple Hudson back in March.  He does endorse nightly use of CPAP.   He has had multiple hospitalizations since prior visit   Hospitalized 12/2021 for frequent falls and hallucinations, started on Seroquel at that time per psychiatry recommendations and felt hallucinations in setting of vascular parkinsonism.  He was discharged to SNF for deconditioning.   He underwent kyphoplasty on 2/28 for lumbar  compression fracture by Dr. Corliss Skains   hospitalized again 3/1 for generalized abdominal pain and frequent falls, discharged to CIR on 3/10. During CIR stay, he was started on amantadine to help with activation and remained on low-dose Seroquel to help with sleep and decrease nighttime hallucinations.   Suffered a closed left hip fracture post fall 01/2022 and underwent surgical repair.  He was discharged to SNF rehab and returned home on 6/8.   Diagnosed with left common femoral vein, SF junction, femoral vein, proximal profunda vein, popliteal vein and posterior tibial vein DVT on 6/28. Placed on Xarelto with outpatient vascular surgery follow-up. Had issues with hematuria, improved after lowering dose to therapeutic dose but still having issues.  Recently transition to Lovenox for upcoming liver biopsy, wife reports resolution of discolored urine since stopping Xarelto.  Plans on pursuing cystoscopy in the near future.  Update 12/08/2021 JM: Returns for follow-up visit after prior visit 3 months ago for concern of worsening gait and functioning. Hx of prior stroke and parkinsonism. At prior visit, adjusted Sinemet dosage - currently on 25-100 1 tab 3x daily and ER 25-100 nightly. Per patient and wife, gait has remained the same - denies worsening but also denies any improvement. Tolerating Sinemet without side effects but no noticeable difference since dose change 3 months ago. Recently seen by Dr. Ethelene Hal for lower back issues and plans to start PT for primarily lumbar spine on 3/2. Continues to use  RW but limited ambulate and only short distance. Does c/o neck and shoulder pain which is chronic but gradually worsening. Limited neck ROM. Denies radiculopathy. No prior cervical imaging. At times, will have difficulty holding head up and sits in a hunched over position. He will have difficulty holding on to small, especially lighter objects, right > left since stroke. C/o shortness of breath after talking for prolonged periods of time which has been present over the past 4-5 months. Denies SOB on exertion but wife believes that he does. Wife also notes voice being softer. Denies any swallowing difficulties. Does have chronic diabetic neuropathy with some worsening over the past 6 months. Recent A1c satisfactory. BUE tremors stable. He also c/o continued fatigue.    Stable from stroke standpoint without new stroke/TIA symptoms. Compliant on Plavix and atorvastatin without side effects.  Blood pressure today 112/75. Recent lipid panel satisfactory.    No further concerns at this time     Update 09/14/2021 JM: Returns for sooner scheduled visit accompanied by his wife.  Concern regarding worsening gait and functioning since prior visit. He did have a fall on 8/23 with subsequent left acetabular fracture - ortho eval recommended nonoperative management.  Admitted to SNF rehab and discharged on 9/23. Reports since he returned home from rehab, gait has gradually worsened (increased shuffling and decreased step height) and has been requiring more assistance.  Working with [facility] PT initially 2x weekly now only 1x per week. Suffered a fall 10/29. Eval at [facility] walk-in clinic with xray showing possible compression fracture per wife report (unable to personally view via epic).  He is currently awaiting eval by orthopedics. He has had f/u with ortho for hip fx since returning home and f/u visit scheduled end of this month. Per wife, currently limited ambulation and exercise - will only walk to go to the bathroom. PT  currently on hold until further eval with our office and ortho.    Wife apparently increased Sinemet to 3 times daily (currently taking at 10am, 4pm and 10pm) approx 1  month ago. Per wife, slight improvement of gait and functioning since increase during the day but does have worsening gait and stiffness upon awakening. Follows with Dr. 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 Also reports hallucinations during SNF rehab stay - this has since improved.  Wife questions if Sinemet dosage needs to be adjusted   Eval by Dr. Frances Furbish for second opinion of gait disorder and wife concern of Parkinson's disease. 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      Update 05/12/2021 Dr. Pearlean Brownie; He returns for follow-up after last visit with Shanda Bumps nurse practitioner in April 2022.  Is accompanied by his wife.  Patient has had no recurrent stroke or TIA symptoms.  He continues to have mild dizziness and imbalance in the right side paresthesias and diminished fine motor skills.  He occasionally has tingling of his fingers or his feet but these are not bothersome.  He is quite active and works out 3 days a week at [employer].  He remains on Plavix which is tolerating well without bruising or bleeding.  He takes Lipitor 20 mg daily and is tolerating it well.  He states his primary care physician did check his lipid profile and A1c about 5 weeks ago but I do not have those results and he was told they were satisfactory.  He does have chronic low back pain and also complains of fatigue and tiredness.  He has not discussed this with primary physician had any work-up done for this.  Patient has history of parkinsonian tremor and has a prescription for Sinemet 25/103 times daily but states that he gets it only once a day or so as he  forgets to take it and other times.  Tremor seems intermittent and not constant and is not functionally disabling.  Wife feels that he does have a shuffling gait particularly when he is tired.  He however denies significant bradykinesia and is able to get up and walk by himself easily without assistance.  He denies drooling of saliva or micrographia.  He has not had any particular worsening of his tremors or other parkinsonian symptoms in recent months.     Update 02/19/2021 JM: Jordan [name] returns for acute visit per wife request due to recent onset of increased fatigue   Known hx of daytime fatigue with known OSA on CPAP and narcolepsy. Per wife, over the past 3 to 4 days, he has been experiencing increased daytime sleepiness where he is sleeping majority of the day as well as sleeping well at night.  Reports PCP recently changed Adderall IR to XR for narcolepsy but will occasionally miss a dose.  Also reports recently being started on sertraline 50 mg daily on 4/14 which he will take in the morning.  He also recently started PT on Monday for gait impairment and BPPV.  No other associated symptoms such as confusion, worsening cognition, weakness, numbness/tingling, speech deficit, visual changes or gait changes.  No further concerns at this time     Update 11/27/2020 JM: Jordan [name] returns per patient request for sooner scheduled visit due to continued gait impairment and lower back pain.  He is currently unaccompanied.  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  Tremors have been stable without worsening.  He does feel as though his  ambulation is worse with increased back pain. He did have a mechanical fall seen in ED on 11/05/2020 thankfully without injury.   He does have chronic back pain previously doing routine exercise prior to his stroke but has not returned back to routine exercise since. He admits to sedentary life style with limited physical  activity. He will fall asleep easily during the day watching TV and occasional issues with insomnia. 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  No further concerns at this time.   Update 09/15/2020 JM: Jordan [name] returns for stroke follow-up accompanied by his wife.  He has been doing well since prior visit with residual decreased right hand dexterity and decreased sensory right fingertips.  Denies new or worsening stroke/TIA symptoms. He does complain of short-term memory loss such as forgetting an actors name or random fact.  He also continues to experience R>L upper extremity tremors and gait impairment with imbalance with fall in 06/2020 resulting in nasal bone fracture.  Wife reports shuffling type gait when fatigued.  Remains on Plavix and atorvastatin for secondary stroke prevention without side effects.  Blood pressure today 126/73.  Glucose level stable per patient.  Reports ongoing compliance with CPAP for OSA management managed by pulmonology Dr. Maple Hudson.  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  He complains of excessive daytime fatigue, vivid dreams and occasional insomnia. Per patient, he has not trialed any other medication for narcolepsy.  No further concerns at this time.   Update 05/15/2020 JM: Jordan [name] is being seen for hospital follow-up accompanied by his wife.  He was discharged home from CIR on 04/29/2020 after 11 day stay.  Residual deficits of mild imbalance and mildly decreased sensation R finger tips and decreased right hand fine motor skills. Does report ongoing improvement currently working with home health PT. he has had gait impairment ongoing and right hand decreased fine motor control with tremors and was evaluated by Dr. Frances Furbish on 12/11/2019 for possible Parkinson's but was not felt this was Parkinson's related.  He does continue to use cane for ambulation and denies any recent  falls.  He is questioning return to driving as he was advised no driving at discharge until further cleared.  Completed 3 weeks DAPT and continues on Plavix alone without bleeding or bruising.  Continues on atorvastatin 40 mg daily without myalgias.  Blood pressure today 118/84.  Glucose levels stable.  Endorses ongoing use of CPAP for OSA management.  No further concerns at this time.    Stroke admission 04/14/2020 [name] is a 83 y.o. male with history of  diabetes, stroke, hypertension, hyperlipidemia, permanent cardiac pacemaker  who presented on 04/14/2020 with R sided decreased sensation.  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  Previously on clopidogrel and recommended DAPT for 3 weeks then clopidogrel alone.  Presented in hypertensive urgency with BP 204/116 and recommended long-term BP goal normotensive range.  LDL 57 and recommended continuation of atorvastatin 40 mg daily.  Uncontrolled DM with A1c 7.9.  Other stroke risk factors include prior stroke 01/2018 with multifocal punctate infarcts concerning for embolic pattern, former tobacco use, EtOH use, obesity, CAD, OSA on CPAP and permanent cardiac pacemaker.  Evaluated by therapies who recommended CIR for residual deficits of imbalance and safety affecting ADLs and mobility.  Discharged to CIR on 04/18/2020.   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 Small vessel disease. Sinus dz. ASPECTS 10.    CTA head & neck no LVO. Aortic atherosclerosis.  MRI  L thalamic infarct. Small vessel disease. Atrophy.  2D Echo EF 65-70%. No source of embolus. LA mildly dilated.  Pacemaker interrogation no afib LDL 57 -continued atorvastatin 40 mg daily HgbA1c 7.9 Lovenox 40 mg sq daily for VTE prophylaxis clopidogrel 75 mg daily prior to admission, now on aspirin 81 mg daily and clopidogrel 75 mg daily. Continue DAPT x 3 weeks then plavix  alone   Therapy recommendations:  CIR Disposition:  CIR  REVIEW OF SYSTEMS: Out of a complete 14 system review of symptoms, the patient complains only of the following symptoms, and all other reviewed systems are negative.  ALLERGIES: Allergies  Allergen Reactions   Lisinopril Other (See Comments)   Amantadines Other (See Comments)    Mental changes   Clonazepam Other (See Comments)    Mental changes   Zoloft [Sertraline] Other (See Comments)    Mental changes    HOME MEDICATIONS: Outpatient Medications Prior to Visit  Medication Sig Dispense Refill   acetaminophen (TYLENOL) 500 MG tablet Take 500-1,000 mg by mouth every 6 (six) hours as needed for moderate pain or mild pain.     atorvastatin (LIPITOR) 40 MG tablet Take 0.5 tablets (20 mg total) by mouth daily. (Patient taking differently: Take 20 mg by mouth at bedtime.) 30 tablet 0   b complex vitamins capsule Take 1 capsule by mouth at bedtime.     bifidobacterium infantis (ALIGN) capsule Take 1 capsule by mouth daily.     Camphor-Menthol-Methyl Sal (HM SALONPAS PAIN RELIEF EX) Apply 1 patch topically daily as needed (Back pain).     carbidopa-levodopa (SINEMET CR) 50-200 MG tablet Take 1 tablet by mouth at bedtime. 90 tablet 3   carbidopa-levodopa (SINEMET IR) 25-100 MG tablet Take 2 tablets by mouth 3 (three) times daily. 180 tablet 5   Cholecalciferol (VITAMIN D) 50 MCG (2000 UT) CAPS Take 2,000 Units by mouth at bedtime.     clotrimazole-betamethasone (LOTRISONE) cream Apply 1 Application topically daily as needed (Rash).     docusate sodium (COLACE) 100 MG capsule Take 1 capsule (100 mg total) by mouth 2 (two) times daily. (Patient taking differently: Take 100 mg by mouth daily as needed for mild constipation or moderate constipation. As Needed) 10 capsule 0   feeding supplement, GLUCERNA SHAKE, (GLUCERNA SHAKE) LIQD Take 237 mLs by mouth 2 (two) times daily between meals. Alternate with ensure Diabetic     finasteride  (PROSCAR) 5 MG tablet Take 1 tablet by mouth at bedtime.     furosemide (LASIX) 20 MG tablet USE AS NEEDED FOR UP TO 5 DAYS IF SWELLING IN LEGS RECURS. CONTACT CARDIOLOGY IS SWELLING DOES NOT IMPROVE OR GETS WORSE. 15 tablet 0   lidocaine (LIDODERM) 5 % Place 1 patch onto the skin daily as needed. Purchase over the counter. On for 12 hours and off for 12 hours 30 patch 0   magnesium gluconate (MAGONATE) 500 MG tablet Take 0.5 tablets (250 mg total) by mouth at bedtime. 30 tablet 0   Melatonin 10 MG TABS Take 10 mg by mouth at bedtime.     metFORMIN (GLUCOPHAGE) 500 MG tablet Take 1,000 mg by mouth 2 (two) times daily. 1000 mg in the am and 1000 mg at night     Methylcobalamin 5000 MCG CHEW Chew by mouth.  modafinil (PROVIGIL) 200 MG tablet TAKE 1 TABLET BY MOUTH EVERY DAY 30 tablet 5   Multiple Vitamin (MULTIVITAMIN WITH MINERALS) TABS tablet Take 1 tablet by mouth daily.     NITROSTAT 0.4 MG SL tablet PLACE 1 TABLET (0.4 MG TOTAL) UNDER THE TONGUE EVERY 5 (FIVE) MINUTES AS NEEDED FOR CHEST PAIN. 25 tablet 5   Omega-3 Fatty Acids (OMEGA 3 PO) Take 1,280 mg by mouth at bedtime.     polyethylene glycol (MIRALAX / GLYCOLAX) 17 g packet Take 17 g by mouth daily. (Patient taking differently: Take 17 g by mouth daily as needed for mild constipation.) 14 each 0   polyvinyl alcohol (LIQUIFILM TEARS) 1.4 % ophthalmic solution Place 1 drop into both eyes as needed for dry eyes. (Patient taking differently: Place 1 drop into both eyes daily as needed for dry eyes.) 15 mL 0   QUEtiapine (SEROQUEL) 25 MG tablet Take 1 tablet (25 mg total) by mouth at bedtime. 45 tablet 1   rivaroxaban (XARELTO) 20 MG TABS tablet Take 1 tablet (20 mg total) by mouth daily. 30 tablet 1   tamsulosin (FLOMAX) 0.4 MG CAPS capsule Take by mouth.     Vitamin D, Ergocalciferol, (DRISDOL) 1.25 MG (50000 UNIT) CAPS capsule TAKE 1 CAPSULE (50,000 UNITS TOTAL) BY MOUTH EVERY 7 (SEVEN) DAYS 5 capsule 0   Cyanocobalamin 5000 MCG LOZG Take  1 lozenge by mouth daily.     sitaGLIPtin (JANUVIA) 50 MG tablet Take 50 mg by mouth daily.     No facility-administered medications prior to visit.    PAST MEDICAL HISTORY: Past Medical History:  Diagnosis Date   Arthritis    R shoulder, bone spur   Arthritis    "hands" (03/25/2016)   Chronic diastolic heart failure (HCC)    Chronic lower back pain    Coronary heart disease    Dr Garnette Scheuermann   Diverticulitis 12/2021   DVT (deep venous thrombosis) (HCC) 05/2022   left leg   H/O cardiovascular stress test    perhaps last one was 2009   Heart murmur    12/29/11 echo: mild MR, no AS, trivial TR   History of gout    Hyperlipidemia    Hypertension    saw Dr. Mendel Ryder last earl;y- 2014, cardiac cath. last done ?2009, blocks seen didn't require any intervention at that point.   Narcolepsy    [name] 04-11-97; Mean Latency 1.18min, SOREM 2   OSA on CPAP    NPSG 12-13-98 AHI 22.7   PONV (postoperative nausea and vomiting)    Presence of permanent cardiac pacemaker    Shortness of breath    with exertion    Stroke (HCC)    Type II diabetes mellitus (HCC)     PAST SURGICAL HISTORY: Past Surgical History:  Procedure Laterality Date   CARDIAC CATHETERIZATION  2009?   EP IMPLANTABLE DEVICE N/A 03/25/2016   Procedure: Pacemaker Implant - Dual Chamber;  Surgeon: Marinus Maw, MD;  Location: [facility] INVASIVE CV LAB;  Service: Cardiovascular;  Laterality: N/A;   FRACTURE SURGERY     HIP FRACTURE SURGERY     INSERT / REPLACE / REMOVE PACEMAKER  03/24/2016   INTRAMEDULLARY (IM) NAIL INTERTROCHANTERIC Left 02/08/2022   Procedure: INTRAMEDULLARY (IM) NAIL INTERTROCHANTRIC;  Surgeon: Cammy Copa, MD;  Location: [facility] OR;  Service: Orthopedics;  Laterality: Left;   IR KYPHO LUMBAR INC FX REDUCE BONE BX UNI/BIL CANNULATION INC/IMAGING  12/29/2021   IR RADIOLOGIST EVAL & MGMT  12/15/2021  IR RADIOLOGY PERIPHERAL GUIDED IV START  07/14/2022   IR US GUIDE VASC ACCESS RIGHT  07/15/2022   LEFT AND  RIGHT HEART CATHETERIZATION WITH CORONARY ANGIOGRAM N/A 06/05/2014   Procedure: LEFT AND RIGHT HEART CATHETERIZATION WITH CORONARY ANGIOGRAM;  Surgeon: Lesleigh Noe, MD;  Location: [facility] CATH LAB;  Service: Cardiovascular;  Laterality: N/A;   NASAL FRACTURE SURGERY     SHOULDER ARTHROSCOPY WITH ROTATOR CUFF REPAIR AND SUBACROMIAL DECOMPRESSION Right 08/16/2013   Procedure: SHOULDER ARTHROSCOPY WITH ROTATOR CUFF REPAIR AND SUBACROMIAL DECOMPRESSION;  Surgeon: Mable Paris, MD;  Location: [facility] OR;  Service: Orthopedics;  Laterality: Right;  Right shoulder arthroscopy rotator cuff repair, subacromial decompression.   TONSILLECTOMY      FAMILY HISTORY: Family History  Problem Relation Age of Onset   Sleep apnea Sister    Colon cancer Sister     SOCIAL HISTORY: Social History   Socioeconomic History   Marital status: Married    Spouse name: [name]   Number of children: Not on file   Years of education: Not on file   Highest education level: Not on file  Occupational History   Occupation: [employer]   Occupation: [place] [employer]  Tobacco Use   Smoking status: Former    Current packs/day: 0.00    Average packs/day: 2.5 packs/day for 6.0 years (15.0 ttl pk-yrs)    Types: Cigarettes    Start date: 06/04/1961    Quit date: 06/05/1967    Years since quitting: 56.7   Smokeless tobacco: Never  Vaping Use   Vaping status: Never Used  Substance and Sexual Activity   Alcohol use: Yes    Comment: rare   Drug use: No   Sexual activity: Not Currently  Other Topics Concern   Not on file  Social History Narrative   Lives with wife and son   Right handed   Drinks 1 cups caffeine daily   Social Drivers of Corporate investment banker Strain: Not on file  Food Insecurity: Not on file  Transportation Needs: Not on file  Physical Activity: Not on file  Stress: Not on file  Social Connections: Not on file  Intimate Partner Violence: Not on file      PHYSICAL  EXAM  Vitals:   02/06/24 1538  BP: 100/69  Pulse: 78  Weight: 212 lb (96.2 kg)  Height: 5\' 9"  (1.753 m)   Body mass index is 31.31 kg/m.  Generalized: Well developed, very pleasant elderly Caucasian male, in no acute distress   Neurological examination  Mentation: Alert oriented to time, place, history taking. Follows all commands speech and language fluent. Mild to mod hypophonia.  Moderately masked facial expressions. Cranial nerve II-XII: Pupils were equal round reactive to light.  Impaired OD extraocular movement, convergence insufficiency.  Visual field were full on confrontational test. Facial sensation and strength were normal. Uvula tongue midline. Head turning and shoulder shrug  were normal and symmetric. Motor: The motor testing reveals 5 over 5 strength of all 4 extremities.  Unable to appreciate resting tremor.  Moderate cogwheel rigidity upon activation R>L.  Finger taps and toe taps mildly impaired Sensory: Sensory testing is intact to soft touch on all 4 extremities. No evidence of extinction is noted.  Gait and station: Deferred     DIAGNOSTIC DATA (LABS, IMAGING, TESTING) - I reviewed patient records, labs, notes, testing and imaging myself where available.  Lab Results  Component Value Date   WBC 8.4 01/23/2024   HGB 10.6 (L)  01/23/2024   HCT 34.9 (L) 01/23/2024   MCV 82.9 01/23/2024   PLT 300 01/23/2024      Component Value Date/Time   NA 140 01/23/2024 1639   NA 145 07/23/2021 0000   K 3.7 01/23/2024 1639   CL 103 01/23/2024 1639   CO2 24 01/23/2024 1639   GLUCOSE 165 (H) 01/23/2024 1639   BUN 15 01/23/2024 1639   BUN 14 07/23/2021 0000   CREATININE 0.99 01/23/2024 1639   CREATININE 1.02 12/13/2023 1221   CREATININE 1.16 03/18/2016 1011   CALCIUM 9.8 01/23/2024 1639   PROT 7.1 01/23/2024 1639   ALBUMIN 3.2 (L) 01/23/2024 1639   AST 29 01/23/2024 1639   AST 20 12/13/2023 1221   ALT 9 01/23/2024 1639   ALT 5 12/13/2023 1221   ALKPHOS 63  01/23/2024 1639   BILITOT 0.5 01/23/2024 1639   BILITOT 0.4 12/13/2023 1221   GFRNONAA >60 01/23/2024 1639   GFRNONAA >60 12/13/2023 1221   GFRAA >90 07/23/2021 0000   Lab Results  Component Value Date   CHOL 123 04/29/2022   HDL 44 04/29/2022   LDLCALC 51 04/29/2022   TRIG 141 04/29/2022   CHOLHDL 2.8 04/29/2022   Lab Results  Component Value Date   HGBA1C 8.4 (H) 04/29/2022   Lab Results  Component Value Date   VITAMINB12 695 12/08/2021   Lab Results  Component Value Date   TSH 1.030 12/19/2021      ASSESSMENT AND PLAN GORDY GOAR is a 83 y.o. 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 causing falls, DDx medication side effect vs orthostatic hypotension vs myoclonus 2/2 parkinsonism vs lumbar issues Unable to obtain orthostatic vitals today, request trying to obtain at home with assistance from [facility] PT Continue working with home health therapies Per wife request, decrease Seroquel to 12.5 mg nightly for 1 week then discontinue Continue Sinemet IR 2 tabs twice daily, unclear if increased falls occurred after increasing dosage, if symptoms persist will consider decreasing dosage or transition her Sinemet CR Continue Sinemet CR 1 tab nightly  Hx of stroke and TIA Continue Xarelto and atorvastatin managed/prescribed by PCP Continue routine follow-up with cardiology and PCP for stroke risk factor management      Follow-up in June as scheduled    I spent a prolonged 90 minutes of face-to-face and non-face-to-face time with patient and wife.  This included previsit chart review including review of recent  hospitalization, lab review, study review, order entry, electronic health record documentation, patient and wife education and discussion regarding above diagnoses and treatment plan and answered all other questions to patient and wife's satisfaction  Ihor Austin, AGNP-BC  [facility] [address] [place], [place] [address]  Phone [phone] Fax [phone] Note: This document was prepared with digital dictation and possible smart phrase technology. Any transcriptional errors that result from this process are unintentional.

## 2024-02-06 NOTE — Patient Instructions (Addendum)
 Your Plan:  Decrease Seroquel to 12.5mg  for 7 days then stop   Continue Sinemet IR 2 tabs 3 times daily and Sinemet CR 1 tab nightly  Continue working with home health therapy, see if they are able to check blood pressure while standing at their next visit as we were unable to obtain at today's visit.      Follow up in June as scheduled       Thank you for coming to see [place] at [facility]. I hope we have been able to provide you high quality care today.  You may receive a patient satisfaction survey over the next few weeks. We would appreciate your feedback and comments so that we may continue to improve ourselves and the health of our patients.

## 2024-02-07 ENCOUNTER — Encounter: Payer: No Typology Code available for payment source | Admitting: Speech Pathology

## 2024-02-07 ENCOUNTER — Inpatient Hospital Stay: Attending: Nurse Practitioner | Admitting: Hematology

## 2024-02-07 ENCOUNTER — Encounter: Payer: No Typology Code available for payment source | Admitting: Occupational Therapy

## 2024-02-07 ENCOUNTER — Inpatient Hospital Stay: Payer: Medicare (Managed Care)

## 2024-02-07 ENCOUNTER — Ambulatory Visit: Payer: No Typology Code available for payment source | Admitting: Physical Therapy

## 2024-02-07 VITALS — BP 107/68 | HR 79 | Temp 98.2°F | Resp 16 | Ht 69.0 in | Wt 213.3 lb

## 2024-02-07 DIAGNOSIS — C7A8 Other malignant neuroendocrine tumors: Secondary | ICD-10-CM | POA: Diagnosis present

## 2024-02-07 DIAGNOSIS — D649 Anemia, unspecified: Secondary | ICD-10-CM | POA: Diagnosis not present

## 2024-02-07 DIAGNOSIS — Z8639 Personal history of other endocrine, nutritional and metabolic disease: Secondary | ICD-10-CM | POA: Insufficient documentation

## 2024-02-07 DIAGNOSIS — G20A1 Parkinson's disease without dyskinesia, without mention of fluctuations: Secondary | ICD-10-CM | POA: Diagnosis not present

## 2024-02-07 DIAGNOSIS — R194 Change in bowel habit: Secondary | ICD-10-CM | POA: Diagnosis not present

## 2024-02-07 DIAGNOSIS — C7B8 Other secondary neuroendocrine tumors: Secondary | ICD-10-CM | POA: Diagnosis present

## 2024-02-07 DIAGNOSIS — Z79899 Other long term (current) drug therapy: Secondary | ICD-10-CM | POA: Insufficient documentation

## 2024-02-07 LAB — CBC WITH DIFFERENTIAL (CANCER CENTER ONLY)
Abs Immature Granulocytes: 0.02 10*3/uL (ref 0.00–0.07)
Basophils Absolute: 0.1 10*3/uL (ref 0.0–0.1)
Basophils Relative: 1 %
Eosinophils Absolute: 0.2 10*3/uL (ref 0.0–0.5)
Eosinophils Relative: 2 %
HCT: 32.7 % — ABNORMAL LOW (ref 39.0–52.0)
Hemoglobin: 10.1 g/dL — ABNORMAL LOW (ref 13.0–17.0)
Immature Granulocytes: 0 %
Lymphocytes Relative: 22 %
Lymphs Abs: 2.5 10*3/uL (ref 0.7–4.0)
MCH: 24.9 pg — ABNORMAL LOW (ref 26.0–34.0)
MCHC: 30.9 g/dL (ref 30.0–36.0)
MCV: 80.5 fL (ref 80.0–100.0)
Monocytes Absolute: 0.8 10*3/uL (ref 0.1–1.0)
Monocytes Relative: 7 %
Neutro Abs: 7.7 10*3/uL (ref 1.7–7.7)
Neutrophils Relative %: 68 %
Platelet Count: 305 10*3/uL (ref 150–400)
RBC: 4.06 MIL/uL — ABNORMAL LOW (ref 4.22–5.81)
RDW: 16 % — ABNORMAL HIGH (ref 11.5–15.5)
WBC Count: 11.3 10*3/uL — ABNORMAL HIGH (ref 4.0–10.5)
nRBC: 0 % (ref 0.0–0.2)

## 2024-02-07 LAB — RETIC PANEL
Immature Retic Fract: 28.3 % — ABNORMAL HIGH (ref 2.3–15.9)
RBC.: 4 MIL/uL — ABNORMAL LOW (ref 4.22–5.81)
Retic Count, Absolute: 62 10*3/uL (ref 19.0–186.0)
Retic Ct Pct: 1.6 % (ref 0.4–3.1)
Reticulocyte Hemoglobin: 24.2 pg — ABNORMAL LOW (ref >27.9)

## 2024-02-07 LAB — VITAMIN B12: Vitamin B-12: 386 pg/mL (ref 180–914)

## 2024-02-07 LAB — VITAMIN D 25 HYDROXY (VIT D DEFICIENCY, FRACTURES): Vit D, 25-Hydroxy: 68.06 ng/mL (ref 30–100)

## 2024-02-07 NOTE — Progress Notes (Signed)
 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 CT AP on 06/17/22 showed: calcified mesenteric mass with suspected associated small bowel mass; suspicious right hepatic lesion; nodular enhancement along wall of bowel loop in central abdomen.  -abdomen MRI 06/23/22 showed: 3.9 cm mass in right hepatic lobe; small RLQ mesenteric mass or lymphadenopathy. 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 -PET DOTATATE from 08/12/22 showed avid disease within the liver, mesentery and small bowel. 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 He reports worsening hyperglycemia, and decreased appetite, which are likely related to the injections.  So we stopped after last injection in 11/2022 -He is clinically stable no increased diarrhea, flushing, or abdominal cramps since he came off the injection.  Continue clinical observation. -I also reviewed the other treatment options, such as Afinitor or Lutathera, if he has significant disease progression.  -restaging CT in 01/2024 showed stable disease   Assessment & Plan Metastatic neuroendocrine tumor The metastatic neuroendocrine tumor is well-managed with slight progression in the liver. Mesenteric lymph node involvement remains unchanged. He is asymptomatic, and chromogranin A levels are slightly elevated, indicating minor progression. Active treatment is not required  at this time. - Continue observation - Consider PET scan in fall or winter if insurance covers - Monitor chromogranin A levels - Discuss potential future treatment with Lutathera if significant disease progression occurs  Irregular bowel movements He experiences irregular bowel movements, primarily constipation with occasional explosive diarrhea. The irregularity may be related to the neuroendocrine tumor's hormone release. He has difficulty consuming fiber-rich foods due to dental issues and reduced appetite. - Recommend fiber supplement and Colace - Continue probiotics - Monitor bowel movement regularity  Chronic anemia He has mild chronic anemia with hemoglobin levels around 10-11.5 g/dL, likely due to multifactorial causes including hematuria, nutritional deficiencies, and metastatic cancer. Fatigue is likely exacerbated by Parkinson's disease. Iron studies and vitamin levels will be checked to determine supplementation needs. - Order CBC and iron studies - Consider prenatal multivitamin with iron, B12, and folate - Evaluate lab results for potential supplementation  Plan -He is clinically stable, no new malignancy related symptoms.  Will continue observation. 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   EXAM: CT ABDOMEN AND PELVIS WITH CONTRAST  IMPRESSION: 1. Calcified mesenteric mass with suspected associated small-bowel mass most suggestive of carcinoid tumor of the ileum with  mesenteric metastatic involvement. 2. Hepatic lesion as discussed above in the posterior  RIGHT hemiliver is suspicious for metastasis in this context. For all above findings in lieu of hepatic MRI, if the patient is not able to undergo hepatic MRI, biochemical correlation and DOTATATE PET may be helpful. 3. Bowel loop in the central abdomen with nodular enhancement approximately 8-9 mm along the wall in terms of greatest wall thickness. 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 4. Hepatic steatosis. 5. Resolution of inflammation about the pancreatic tail with nodular appearance of pancreatic tail. Attention on subsequent imaging is suggested. This may reflect sequela of prior inflammation given improvement but underlying lesion in this location while not favored is not excluded.  ADDENDUM: For clarification, the central, ileal mesenteric lesion currently measuring 2.1 x 2.1 x 2.4 cm measured 1.6 x 1.8 x 2.1 cm in 2015.   Faintly visible area in the posterior RIGHT hemiliver in 2015 raising the question of possibility of small lesion at best 2 x 2.2 cm in the posterior RIGHT hemiliver this areas now clearly visible measuring 4.1 x 4.0 cm. Findings are compatible with indolent behavior. Indolent carcinoid remains the primary differential consideration. Tumor marker correlation is suggested with DOTATATE PET for further evaluation as warranted.   06/23/2022 Imaging   EXAM: MRI ABDOMEN WITHOUT AND WITH CONTRAST  IMPRESSION: 3.9 cm heterogeneous enhancing mass in segment 7 of the right hepatic lobe, which is nonspecific. Hepatic metastasis cannot be excluded.   Small benign hemangioma and cysts in the posterior right hepatic lobe.   Small right lower quadrant mesenteric mass or lymphadenopathy, without significant change.   Stable 2.1 cm  cystic lesion in the pancreatic neck, with other tiny sub-cm cystic foci in the pancreatic head and body. Differential diagnosis includes pancreatic pseudocysts and indolent IPMNs. Recommend continued follow-up by MRI in 6 months. This recommendation follows ACR consensus guidelines: Management of Incidental Pancreatic Cysts: A White Paper of the ACR Incidental Findings Committee. J Am Coll Radiol 2017;14:911-923.   7 mm calculus in the left renal pelvis, with mild left pelvicaliectasis.   07/14/2022 Initial Biopsy   FINAL MICROSCOPIC DIAGNOSIS:   A. LIVER, RIGHT, NEEDLE CORE BIOPSY:  -  Metastatic low-grade neuroendocrine tumor (positive for chromogranin and synaptophysin).  -  Proliferation rate by Ki-67 less than 3%.    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 Approximately 2.8 cm long segment of small bowel loop in the right lower quadrant, posteriorly, which exhibit asymmetric circumferential hyperattenuating mass, which may correspond to patient's known neuroendocrine tumor primary. No proximal bowel obstruction. 2. Redemonstration of a lobulated heterogeneous mass with extensive dystrophic calcifications along the mesentery measuring approximately 1.5 x 2.1 cm, which was markedly FDG avid on the prior exam and is compatible with metastatic disease. No significant interval change in size. 3. Redemonstration of several focal liver lesions, grossly similar to the prior study. The largest lesion in the right hepatic lobe, segment 7 is compatible with patient's known metastasis. No new arterial hyperenhancing lesion seen. 4. Multiple other nonacute observations, as described above.        Discussed the use of AI scribe  software for clinical note  transcription with the patient, who gave verbal consent to proceed.  History of Present Illness The patient, an 83 year old with a history of metastatic neuroendocrine tumor, Parkinson's disease, and a previous stroke, presents with irregular bowel movements, alternating between constipation and diarrhea. The patient describes feeling the urge to defecate but often sits for extended periods without passing stool. Occasionally, the patient experiences what the caregiver describes as "blowouts," which are explosive bowel movements. The patient has tried various treatments, including Senna laxative, Ducalox, and Miralax, with limited success. The patient also reports fatigue, which is a significant concern for the caregiver. The patient's appetite is decreased, and he has difficulty chewing fibrous foods due to dental issues. The patient has a history of hematuria due to kidney stones and is chronically anemic. The patient's caregiver reports that the patient's fatigue has been particularly severe in the past month, during which the patient had two emergency room visits and one inpatient rehabilitation stay for various reasons, including a transient ischemic attack (TIA) and collapses at home due to leg weakness.     All other systems were reviewed with the patient and are negative.  MEDICAL HISTORY:  Past Medical History:  Diagnosis Date   Arthritis    R shoulder, bone spur   Arthritis    "hands" (03/25/2016)   Chronic diastolic heart failure (HCC)    Chronic lower back pain    Coronary heart disease    Dr Garnette Scheuermann   Diverticulitis 12/2021   DVT (deep venous thrombosis) (HCC) 05/2022   left leg   H/O cardiovascular stress test    perhaps last one was 2009   Heart murmur    12/29/11 echo: mild MR, no AS, trivial TR   History of gout    Hyperlipidemia    Hypertension    saw Dr. Mendel Ryder last earl;y- 2014, cardiac cath. last done ?2009, blocks seen didn't require  any intervention at that point.   Narcolepsy    [name] 04-11-97; Mean Latency 1.88min, SOREM 2   OSA on CPAP    NPSG 12-13-98 AHI 22.7   PONV (postoperative nausea and vomiting)    Presence of permanent cardiac pacemaker    Shortness of breath    with exertion    Stroke (HCC)    Type II diabetes mellitus (HCC)     SURGICAL HISTORY: Past Surgical History:  Procedure Laterality Date   CARDIAC CATHETERIZATION  2009?   EP IMPLANTABLE DEVICE N/A 03/25/2016   Procedure: Pacemaker Implant - Dual Chamber;  Surgeon: Marinus Maw, MD;  Location: [facility] INVASIVE CV LAB;  Service: Cardiovascular;  Laterality: N/A;   FRACTURE SURGERY     HIP FRACTURE SURGERY     INSERT / REPLACE / REMOVE PACEMAKER  03/24/2016   INTRAMEDULLARY (IM) NAIL INTERTROCHANTERIC Left 02/08/2022   Procedure: INTRAMEDULLARY (IM) NAIL INTERTROCHANTRIC;  Surgeon: Cammy Copa, MD;  Location: [facility] OR;  Service: Orthopedics;  Laterality: Left;   IR KYPHO LUMBAR INC FX REDUCE BONE BX UNI/BIL CANNULATION INC/IMAGING  12/29/2021   IR RADIOLOGIST EVAL & MGMT  12/15/2021   IR RADIOLOGY PERIPHERAL GUIDED IV START  07/14/2022   IR US GUIDE VASC ACCESS RIGHT  07/15/2022   LEFT AND RIGHT HEART CATHETERIZATION WITH CORONARY ANGIOGRAM N/A 06/05/2014   Procedure: LEFT AND RIGHT HEART CATHETERIZATION WITH CORONARY ANGIOGRAM;  Surgeon: Lesleigh Noe, MD;  Location: [facility] CATH LAB;  Service: Cardiovascular;  Laterality: N/A;   NASAL FRACTURE SURGERY     SHOULDER ARTHROSCOPY WITH ROTATOR  CUFF REPAIR AND SUBACROMIAL DECOMPRESSION Right 08/16/2013   Procedure: SHOULDER ARTHROSCOPY WITH ROTATOR CUFF REPAIR AND SUBACROMIAL DECOMPRESSION;  Surgeon: Mable Paris, MD;  Location: [facility] OR;  Service: Orthopedics;  Laterality: Right;  Right shoulder arthroscopy rotator cuff repair, subacromial decompression.   TONSILLECTOMY      I have reviewed the social history and family history with the patient and they are unchanged from previous  note.  ALLERGIES:  is allergic to lisinopril, amantadines, clonazepam, and zoloft [sertraline].  MEDICATIONS:  Current Outpatient Medications  Medication Sig Dispense Refill   QUEtiapine (SEROQUEL) 25 MG tablet Take 1 tablet (25 mg total) by mouth at bedtime. (Patient taking differently: Take 25 mg by mouth at bedtime. Patient takes 1/2 tab or 12.5mg ) 45 tablet 1   tamsulosin (FLOMAX) 0.4 MG CAPS capsule Take by mouth.     acetaminophen (TYLENOL) 500 MG tablet Take 500-1,000 mg by mouth every 6 (six) hours as needed for moderate pain or mild pain.     atorvastatin (LIPITOR) 40 MG tablet Take 0.5 tablets (20 mg total) by mouth daily. (Patient taking differently: Take 20 mg by mouth at bedtime.) 30 tablet 0   b complex vitamins capsule Take 1 capsule by mouth at bedtime.     bifidobacterium infantis (ALIGN) capsule Take 1 capsule by mouth daily.     Camphor-Menthol-Methyl Sal (HM SALONPAS PAIN RELIEF EX) Apply 1 patch topically daily as needed (Back pain).     carbidopa-levodopa (SINEMET CR) 50-200 MG tablet Take 1 tablet by mouth at bedtime. 90 tablet 3   carbidopa-levodopa (SINEMET IR) 25-100 MG tablet Take 2 tablets by mouth 3 (three) times daily. 180 tablet 5   Cholecalciferol (VITAMIN D) 50 MCG (2000 UT) CAPS Take 2,000 Units by mouth at bedtime.     clotrimazole-betamethasone (LOTRISONE) cream Apply 1 Application topically daily as needed (Rash).     docusate sodium (COLACE) 100 MG capsule Take 1 capsule (100 mg total) by mouth 2 (two) times daily. (Patient taking differently: Take 100 mg by mouth daily as needed for mild constipation or moderate constipation. As Needed) 10 capsule 0   feeding supplement, GLUCERNA SHAKE, (GLUCERNA SHAKE) LIQD Take 237 mLs by mouth 2 (two) times daily between meals. Alternate with ensure Diabetic     finasteride (PROSCAR) 5 MG tablet Take 1 tablet by mouth at bedtime.     furosemide (LASIX) 20 MG tablet USE AS NEEDED FOR UP TO 5 DAYS IF SWELLING IN LEGS  RECURS. CONTACT CARDIOLOGY IS SWELLING DOES NOT IMPROVE OR GETS WORSE. 15 tablet 0   lidocaine (LIDODERM) 5 % Place 1 patch onto the skin daily as needed. Purchase over the counter. On for 12 hours and off for 12 hours 30 patch 0   magnesium gluconate (MAGONATE) 500 MG tablet Take 0.5 tablets (250 mg total) by mouth at bedtime. 30 tablet 0   Melatonin 10 MG TABS Take 10 mg by mouth at bedtime.     metFORMIN (GLUCOPHAGE) 500 MG tablet Take 1,000 mg by mouth 2 (two) times daily. 1000 mg in the am and 1000 mg at night     Methylcobalamin 5000 MCG CHEW Chew by mouth.     modafinil (PROVIGIL) 200 MG tablet TAKE 1 TABLET BY MOUTH EVERY DAY 30 tablet 5   Multiple Vitamin (MULTIVITAMIN WITH MINERALS) TABS tablet Take 1 tablet by mouth daily.     NITROSTAT 0.4 MG SL tablet PLACE 1 TABLET (0.4 MG TOTAL) UNDER THE TONGUE EVERY 5 (FIVE) MINUTES AS NEEDED  FOR CHEST PAIN. 25 tablet 5   Omega-3 Fatty Acids (OMEGA 3 PO) Take 1,280 mg by mouth at bedtime.     polyethylene glycol (MIRALAX / GLYCOLAX) 17 g packet Take 17 g by mouth daily. (Patient taking differently: Take 17 g by mouth daily as needed for mild constipation.) 14 each 0   polyvinyl alcohol (LIQUIFILM TEARS) 1.4 % ophthalmic solution Place 1 drop into both eyes as needed for dry eyes. (Patient taking differently: Place 1 drop into both eyes daily as needed for dry eyes.) 15 mL 0   rivaroxaban (XARELTO) 20 MG TABS tablet Take 1 tablet (20 mg total) by mouth daily. 30 tablet 1   Vitamin D, Ergocalciferol, (DRISDOL) 1.25 MG (50000 UNIT) CAPS capsule TAKE 1 CAPSULE (50,000 UNITS TOTAL) BY MOUTH EVERY 7 (SEVEN) DAYS 5 capsule 0   No current facility-administered medications for this visit.    PHYSICAL EXAMINATION: ECOG PERFORMANCE STATUS: 3 - Symptomatic, >50% confined to bed  Vitals:   02/07/24 1415  BP: 107/68  Pulse: 79  Resp: 16  Temp: 98.2 F (36.8 C)  SpO2: 97%   Wt Readings from Last 3 Encounters:  02/07/24 213 lb 4.8 oz (96.8 kg)   02/06/24 212 lb (96.2 kg)  01/23/24 216 lb (98 kg)     GENERAL:alert, no distress and comfortable SKIN: skin color, texture, turgor are normal, no rashes or significant lesions EYES: normal, Conjunctiva are pink and non-injected, sclera clear Musculoskeletal:no cyanosis of digits and no clubbing  NEURO: alert & oriented x 3 with fluent speech, no focal motor/sensory deficits  Physical Exam    LABORATORY DATA:  I have reviewed the data as listed    Latest Ref Rng & Units 02/07/2024    3:18 PM 01/23/2024    4:39 PM 01/04/2024    3:50 PM  CBC  WBC 4.0 - 10.5 K/uL 11.3  8.4  12.7   Hemoglobin 13.0 - 17.0 g/dL 19.1  47.8  29.5   Hematocrit 39.0 - 52.0 % 32.7  34.9  36.5   Platelets 150 - 400 K/uL 305  300  318         Latest Ref Rng & Units 01/23/2024    4:39 PM 01/04/2024    3:40 PM 12/22/2023    9:24 PM  CMP  Glucose 70 - 99 mg/dL 621  308  657   BUN 8 - 23 mg/dL 15  16  17    Creatinine 0.61 - 1.24 mg/dL 8.46  9.62  9.52   Sodium 135 - 145 mmol/L 140  139  138   Potassium 3.5 - 5.1 mmol/L 3.7  4.7  4.3   Chloride 98 - 111 mmol/L 103  104  103   CO2 22 - 32 mmol/L 24  16    Calcium 8.9 - 10.3 mg/dL 9.8  9.3    Total Protein 6.5 - 8.1 g/dL 7.1     Total Bilirubin 0.0 - 1.2 mg/dL 0.5     Alkaline Phos 38 - 126 U/L 63     AST 15 - 41 U/L 29     ALT 0 - 44 U/L 9         RADIOGRAPHIC STUDIES: I have personally reviewed the radiological images as listed and agreed with the findings in the report. No results found.    Orders Placed This Encounter  Procedures   Ferritin    Standing Status:   Future    Number of Occurrences:   1  Expiration Date:   02/06/2025   Retic Panel    Standing Status:   Future    Number of Occurrences:   1    Expiration Date:   02/06/2025   Vitamin B12    Standing Status:   Future    Number of Occurrences:   1    Expiration Date:   02/06/2025   Folate RBC    Standing Status:   Future    Number of Occurrences:   1    Expiration Date:    02/06/2025   Vitamin D 25 hydroxy    Standing Status:   Future    Number of Occurrences:   1    Expected Date:   02/07/2024    Expiration Date:   02/06/2025   CBC with Differential (Cancer Center Only)    Standing Status:   Future    Number of Occurrences:   1    Expiration Date:   02/06/2025   All questions were answered. The patient knows to call the clinic with any problems, questions or concerns. No barriers to learning was detected. The total time spent in the appointment was 30 minutes.     Malachy Mood, MD 02/07/2024

## 2024-02-08 ENCOUNTER — Encounter: Payer: Self-pay | Admitting: Hematology

## 2024-02-08 LAB — FOLATE RBC
Folate, Hemolysate: 496 ng/mL
Folate, RBC: 1517 ng/mL (ref >498)
Hematocrit: 32.7 % — ABNORMAL LOW (ref 37.5–51.0)

## 2024-02-08 LAB — FERRITIN: Ferritin: 7 ng/mL — ABNORMAL LOW (ref 24–336)

## 2024-02-09 ENCOUNTER — Other Ambulatory Visit: Payer: Self-pay | Admitting: Nurse Practitioner

## 2024-02-09 ENCOUNTER — Encounter: Payer: Self-pay | Admitting: Adult Health

## 2024-02-09 DIAGNOSIS — D649 Anemia, unspecified: Secondary | ICD-10-CM | POA: Insufficient documentation

## 2024-02-09 MED ORDER — CARBIDOPA-LEVODOPA ER 50-200 MG PO TBCR: 1.0000 | EXTENDED_RELEASE_TABLET | Freq: Four times a day (QID) | ORAL | 11 refills | Status: DC

## 2024-02-13 ENCOUNTER — Telehealth: Payer: Self-pay | Admitting: Pharmacy Technician

## 2024-02-13 ENCOUNTER — Telehealth: Payer: Self-pay

## 2024-02-13 DIAGNOSIS — N201 Calculus of ureter: Secondary | ICD-10-CM | POA: Diagnosis not present

## 2024-02-13 NOTE — Telephone Encounter (Signed)
 Jordan [name],  Patient will need a Community of [facility] approval before we can treat the patient. [facility] must approve the request before we can treat the patient. I have scanned the forms to his media tab for your signature. Please sign the forms and return to me. Fax: [phone]

## 2024-02-13 NOTE — Telephone Encounter (Addendum)
 Reached out to the patient via telephone call.  Spoke with patient's wife.  Went over provider's comments from below with wife.  Wife verbalized understanding.   ----- Message from Sonja Mora sent at 02/13/2024  7:48 AM EDT ----- Please let pt know his iron level is low, and I recommend oral iron OTC such as ferric sulfate 1-2 times daily with orange juice or vitC, please order ferritin and iron+TIBC for next lab in 3 month, thx   Sonja [place]

## 2024-02-14 ENCOUNTER — Ambulatory Visit: Payer: Medicare (Managed Care) | Admitting: Neurology

## 2024-02-15 ENCOUNTER — Encounter: Payer: Self-pay | Admitting: Nurse Practitioner

## 2024-02-15 ENCOUNTER — Encounter: Payer: Self-pay | Admitting: Hematology

## 2024-02-17 ENCOUNTER — Encounter: Payer: Self-pay | Admitting: Nurse Practitioner

## 2024-02-20 DIAGNOSIS — H6123 Impacted cerumen, bilateral: Secondary | ICD-10-CM | POA: Diagnosis not present

## 2024-02-22 NOTE — Telephone Encounter (Signed)
 Heather, Wanted to f/u.  I have not received the signed forms.  Please fax forms to [phone]. Thanks Burdette Carolin

## 2024-02-23 DIAGNOSIS — G4733 Obstructive sleep apnea (adult) (pediatric): Secondary | ICD-10-CM | POA: Diagnosis not present

## 2024-02-25 ENCOUNTER — Emergency Department (HOSPITAL_COMMUNITY)

## 2024-02-25 ENCOUNTER — Other Ambulatory Visit: Payer: Self-pay

## 2024-02-25 ENCOUNTER — Inpatient Hospital Stay (HOSPITAL_COMMUNITY)
Admission: EM | Admit: 2024-02-25 | Discharge: 2024-03-02 | DRG: 092 | Disposition: A | Discharge status: Rehabilitation Facility | Attending: Internal Medicine | Admitting: Internal Medicine

## 2024-02-25 ENCOUNTER — Encounter: Payer: Self-pay | Admitting: Adult Health

## 2024-02-25 ENCOUNTER — Encounter (HOSPITAL_COMMUNITY): Payer: Self-pay

## 2024-02-25 DIAGNOSIS — I5032 Chronic diastolic (congestive) heart failure: Secondary | ICD-10-CM | POA: Diagnosis present

## 2024-02-25 DIAGNOSIS — E1169 Type 2 diabetes mellitus with other specified complication: Secondary | ICD-10-CM | POA: Diagnosis present

## 2024-02-25 DIAGNOSIS — G4733 Obstructive sleep apnea (adult) (pediatric): Secondary | ICD-10-CM | POA: Diagnosis not present

## 2024-02-25 DIAGNOSIS — N202 Calculus of kidney with calculus of ureter: Secondary | ICD-10-CM | POA: Diagnosis present

## 2024-02-25 DIAGNOSIS — G20A1 Parkinson's disease without dyskinesia, without mention of fluctuations: Secondary | ICD-10-CM | POA: Diagnosis not present

## 2024-02-25 DIAGNOSIS — L89151 Pressure ulcer of sacral region, stage 1: Secondary | ICD-10-CM | POA: Diagnosis present

## 2024-02-25 DIAGNOSIS — Z7901 Long term (current) use of anticoagulants: Secondary | ICD-10-CM

## 2024-02-25 DIAGNOSIS — G928 Other toxic encephalopathy: Secondary | ICD-10-CM | POA: Diagnosis not present

## 2024-02-25 DIAGNOSIS — Z87442 Personal history of urinary calculi: Secondary | ICD-10-CM

## 2024-02-25 DIAGNOSIS — I2510 Atherosclerotic heart disease of native coronary artery without angina pectoris: Secondary | ICD-10-CM | POA: Diagnosis present

## 2024-02-25 DIAGNOSIS — R319 Hematuria, unspecified: Secondary | ICD-10-CM | POA: Diagnosis present

## 2024-02-25 DIAGNOSIS — G9340 Encephalopathy, unspecified: Secondary | ICD-10-CM | POA: Diagnosis present

## 2024-02-25 DIAGNOSIS — Z800 Family history of malignant neoplasm of digestive organs: Secondary | ICD-10-CM

## 2024-02-25 DIAGNOSIS — I110 Hypertensive heart disease with heart failure: Secondary | ICD-10-CM | POA: Diagnosis present

## 2024-02-25 DIAGNOSIS — Z79899 Other long term (current) drug therapy: Secondary | ICD-10-CM

## 2024-02-25 DIAGNOSIS — Z950 Presence of cardiac pacemaker: Secondary | ICD-10-CM

## 2024-02-25 DIAGNOSIS — E66811 Obesity, class 1: Secondary | ICD-10-CM | POA: Diagnosis present

## 2024-02-25 DIAGNOSIS — N400 Enlarged prostate without lower urinary tract symptoms: Secondary | ICD-10-CM | POA: Diagnosis present

## 2024-02-25 DIAGNOSIS — I69320 Aphasia following cerebral infarction: Secondary | ICD-10-CM

## 2024-02-25 DIAGNOSIS — C7A8 Other malignant neuroendocrine tumors: Secondary | ICD-10-CM | POA: Diagnosis present

## 2024-02-25 DIAGNOSIS — Z7984 Long term (current) use of oral hypoglycemic drugs: Secondary | ICD-10-CM

## 2024-02-25 DIAGNOSIS — E1165 Type 2 diabetes mellitus with hyperglycemia: Secondary | ICD-10-CM

## 2024-02-25 DIAGNOSIS — Z8673 Personal history of transient ischemic attack (TIA), and cerebral infarction without residual deficits: Secondary | ICD-10-CM

## 2024-02-25 DIAGNOSIS — Z86718 Personal history of other venous thrombosis and embolism: Secondary | ICD-10-CM

## 2024-02-25 DIAGNOSIS — T43595A Adverse effect of other antipsychotics and neuroleptics, initial encounter: Secondary | ICD-10-CM | POA: Diagnosis present

## 2024-02-25 DIAGNOSIS — D509 Iron deficiency anemia, unspecified: Secondary | ICD-10-CM | POA: Diagnosis present

## 2024-02-25 DIAGNOSIS — R531 Weakness: Principal | ICD-10-CM

## 2024-02-25 DIAGNOSIS — R54 Age-related physical debility: Secondary | ICD-10-CM | POA: Diagnosis present

## 2024-02-25 DIAGNOSIS — Z6831 Body mass index (BMI) 31.0-31.9, adult: Secondary | ICD-10-CM

## 2024-02-25 DIAGNOSIS — R627 Adult failure to thrive: Secondary | ICD-10-CM | POA: Diagnosis present

## 2024-02-25 DIAGNOSIS — Z87891 Personal history of nicotine dependence: Secondary | ICD-10-CM

## 2024-02-25 DIAGNOSIS — G20C Parkinsonism, unspecified: Secondary | ICD-10-CM

## 2024-02-25 DIAGNOSIS — C7B8 Other secondary neuroendocrine tumors: Secondary | ICD-10-CM | POA: Diagnosis present

## 2024-02-25 DIAGNOSIS — M545 Low back pain, unspecified: Secondary | ICD-10-CM | POA: Diagnosis present

## 2024-02-25 DIAGNOSIS — E785 Hyperlipidemia, unspecified: Secondary | ICD-10-CM | POA: Diagnosis present

## 2024-02-25 DIAGNOSIS — I951 Orthostatic hypotension: Secondary | ICD-10-CM

## 2024-02-25 DIAGNOSIS — R5381 Other malaise: Secondary | ICD-10-CM

## 2024-02-25 DIAGNOSIS — G8929 Other chronic pain: Secondary | ICD-10-CM | POA: Diagnosis present

## 2024-02-25 DIAGNOSIS — M19011 Primary osteoarthritis, right shoulder: Secondary | ICD-10-CM | POA: Diagnosis present

## 2024-02-25 DIAGNOSIS — R410 Disorientation, unspecified: Secondary | ICD-10-CM

## 2024-02-25 DIAGNOSIS — F05 Delirium due to known physiological condition: Secondary | ICD-10-CM | POA: Diagnosis present

## 2024-02-25 DIAGNOSIS — G47419 Narcolepsy without cataplexy: Secondary | ICD-10-CM | POA: Diagnosis present

## 2024-02-25 DIAGNOSIS — R441 Visual hallucinations: Secondary | ICD-10-CM

## 2024-02-25 DIAGNOSIS — G9341 Metabolic encephalopathy: Secondary | ICD-10-CM | POA: Diagnosis present

## 2024-02-25 DIAGNOSIS — Z888 Allergy status to other drugs, medicaments and biological substances status: Secondary | ICD-10-CM

## 2024-02-25 LAB — COMPREHENSIVE METABOLIC PANEL WITH GFR
ALT: 11 U/L (ref 0–44)
AST: 21 U/L (ref 15–41)
Albumin: 3.3 g/dL — ABNORMAL LOW (ref 3.5–5.0)
Alkaline Phosphatase: 48 U/L (ref 38–126)
Anion gap: 10 (ref 5–15)
BUN: 17 mg/dL (ref 8–23)
CO2: 23 mmol/L (ref 22–32)
Calcium: 9.3 mg/dL (ref 8.9–10.3)
Chloride: 104 mmol/L (ref 98–111)
Creatinine, Ser: 0.93 mg/dL (ref 0.61–1.24)
GFR, Estimated: >60 mL/min (ref >60)
Glucose, Bld: 132 mg/dL — ABNORMAL HIGH (ref 70–99)
Potassium: 4.4 mmol/L (ref 3.5–5.1)
Sodium: 137 mmol/L (ref 135–145)
Total Bilirubin: 0.3 mg/dL (ref 0.0–1.2)
Total Protein: 6.7 g/dL (ref 6.5–8.1)

## 2024-02-25 LAB — URINALYSIS, W/ REFLEX TO CULTURE (INFECTION SUSPECTED)
Bilirubin Urine: NEGATIVE
Glucose, UA: NEGATIVE mg/dL
Ketones, ur: 5 mg/dL — AB
Leukocytes,Ua: NEGATIVE
Nitrite: NEGATIVE
Protein, ur: 100 mg/dL — AB
RBC / HPF: >50 RBC/hpf (ref 0–5)
Specific Gravity, Urine: 1.015 (ref 1.005–1.030)
pH: 6 (ref 5.0–8.0)

## 2024-02-25 LAB — CBC WITH DIFFERENTIAL/PLATELET
Abs Immature Granulocytes: 0.03 10*3/uL (ref 0.00–0.07)
Basophils Absolute: 0.1 10*3/uL (ref 0.0–0.1)
Basophils Relative: 1 %
Eosinophils Absolute: 0.3 10*3/uL (ref 0.0–0.5)
Eosinophils Relative: 3 %
HCT: 36.3 % — ABNORMAL LOW (ref 39.0–52.0)
Hemoglobin: 11.2 g/dL — ABNORMAL LOW (ref 13.0–17.0)
Immature Granulocytes: 0 %
Lymphocytes Relative: 24 %
Lymphs Abs: 2.3 10*3/uL (ref 0.7–4.0)
MCH: 26 pg (ref 26.0–34.0)
MCHC: 30.9 g/dL (ref 30.0–36.0)
MCV: 84.4 fL (ref 80.0–100.0)
Monocytes Absolute: 0.9 10*3/uL (ref 0.1–1.0)
Monocytes Relative: 10 %
Neutro Abs: 5.9 10*3/uL (ref 1.7–7.7)
Neutrophils Relative %: 62 %
Platelets: 264 10*3/uL (ref 150–400)
RBC: 4.3 MIL/uL (ref 4.22–5.81)
RDW: 19.1 % — ABNORMAL HIGH (ref 11.5–15.5)
WBC: 9.5 10*3/uL (ref 4.0–10.5)
nRBC: 0 % (ref 0.0–0.2)

## 2024-02-25 LAB — ETHANOL: Alcohol, Ethyl (B): <15 mg/dL (ref <15)

## 2024-02-25 LAB — AMMONIA: Ammonia: 42 umol/L — ABNORMAL HIGH (ref 9–35)

## 2024-02-25 LAB — MAGNESIUM: Magnesium: 1.6 mg/dL — ABNORMAL LOW (ref 1.7–2.4)

## 2024-02-25 LAB — ACETAMINOPHEN LEVEL: Acetaminophen (Tylenol), Serum: <10 ug/mL — ABNORMAL LOW (ref 10–30)

## 2024-02-25 LAB — SALICYLATE LEVEL: Salicylate Lvl: <7 mg/dL — ABNORMAL LOW (ref 7.0–30.0)

## 2024-02-25 MED ORDER — SODIUM CHLORIDE 0.9 % IV BOLUS
500.0000 mL | Freq: Once | INTRAVENOUS | Status: AC
  Administered 2024-02-25: 500 mL via INTRAVENOUS

## 2024-02-25 MED ORDER — MAGNESIUM SULFATE 2 GM/50ML IV SOLN
2.0000 g | Freq: Once | INTRAVENOUS | Status: AC
  Administered 2024-02-26: 2 g via INTRAVENOUS
  Filled 2024-02-25: qty 50

## 2024-02-25 NOTE — ED Provider Notes (Signed)
 [place] [facility] AT [facility] Provider Note   CSN: [id number] Arrival date & time: 02/25/24  1927     History  Chief Complaint  Patient presents with   Weakness    Jordan [name] is a 83 y.o. male.  83 year old male presents today for concern of generalized weakness and confusion that started this morning.  According to wife who is at bedside this is unusual for him.  He does have history of Parkinson which has been progressively worsening for the past 3 years however this amount of confusion and weakness is unusual for him.  The history is provided by the patient. No language interpreter was used.       Home Medications Prior to Admission medications   Medication Sig Start Date End Date Taking? Authorizing Provider  acetaminophen  (TYLENOL ) 500 MG tablet Take 500-1,000 mg by mouth every 6 (six) hours as needed for moderate pain or mild pain.   Yes [provider]  atorvastatin  (LIPITOR) 40 MG tablet Take 0.5 tablets (20 mg total) by mouth daily. Patient taking differently: Take 20 mg by mouth at bedtime. 07/30/21  Yes Medina-Vargas, Monina C, NP  b complex vitamins capsule Take 1 capsule by mouth at bedtime.   Yes [provider]  bifidobacterium infantis (ALIGN) capsule Take 1 capsule by mouth daily.   Yes [provider]  Camphor-Menthol-Methyl Sal (HM SALONPAS PAIN RELIEF EX) Apply 1 patch topically daily as needed (Back pain).   Yes [provider]  carbidopa -levodopa  (SINEMET  CR) 50-200 MG tablet Take 1 tablet by mouth 4 (four) times daily. 02/09/24  Yes McCue, Camilo Cella, NP  Cholecalciferol  (VITAMIN D ) 50 MCG (2000 UT) CAPS Take 2,000 Units by mouth at bedtime.   Yes [provider]  clotrimazole-betamethasone (LOTRISONE) cream Apply 1 Application topically daily as needed (Rash). 06/21/22  Yes [provider]  docusate sodium  (COLACE) 100 MG capsule Take 1 capsule (100 mg total) by mouth 2 (two)  times daily. Patient taking differently: Take 100 mg by mouth daily as needed for mild constipation or moderate constipation. As Needed 02/17/22  Yes Akula, Vijaya, MD  feeding supplement, GLUCERNA SHAKE, (GLUCERNA SHAKE) LIQD Take 237 mLs by mouth 2 (two) times daily between meals. Alternate with ensure Diabetic   Yes [provider]  lidocaine  (LIDODERM ) 5 % Place 1 patch onto the skin daily as needed. Purchase over the counter. On for 12 hours and off for 12 hours 01/18/22  Yes Love, Renay Carota, PA-C  magnesium  gluconate (MAGONATE) 500 MG tablet Take 0.5 tablets (250 mg total) by mouth at bedtime. 03/24/22  Yes Raulkar, Keven Pel, MD  Melatonin 10 MG TABS Take 10 mg by mouth at bedtime.   Yes [provider]  metFORMIN  (GLUCOPHAGE ) 500 MG tablet Take 1,000 mg by mouth 2 (two) times daily. 1000 mg in the am and 1000 mg at night   Yes [provider]  Methylcobalamin 5000 MCG CHEW Chew 500 mcg by mouth at bedtime.   Yes [provider]  modafinil  (PROVIGIL ) 200 MG tablet TAKE 1 TABLET BY MOUTH EVERY DAY 12/12/23  Yes Young, Rupert Counts D, MD  Multiple Vitamin (MULTIVITAMIN WITH MINERALS) TABS tablet Take 1 tablet by mouth daily. 02/19/18  Yes Barbee Lew, MD  Omega-3 Fatty Acids (OMEGA 3 PO) Take 1,280 mg by mouth at bedtime.   Yes [provider]  OVER THE COUNTER MEDICATION Take 72 mg by mouth in the morning and at bedtime. Ferrasorb  Yes [provider]  polyethylene glycol (MIRALAX  / GLYCOLAX ) 17 g packet Take 17 g by mouth daily. Patient taking differently: Take 17 g by mouth daily as needed for mild constipation. 01/09/22  Yes Lesa Rape, MD  polyvinyl alcohol  (LIQUIFILM TEARS) 1.4 % ophthalmic solution Place 1 drop into both eyes as needed for dry eyes. Patient taking differently: Place 1 drop into both eyes daily as needed for dry eyes. 01/18/22  Yes Love, Renay Carota, PA-C  QUEtiapine  (SEROQUEL ) 25 MG tablet Take 1 tablet (25 mg total) by  mouth at bedtime. Patient taking differently: Take 12.5 mg by mouth at bedtime. 01/18/24  Yes Raulkar, Keven Pel, MD  rivaroxaban  (XARELTO ) 20 MG TABS tablet Take 1 tablet (20 mg total) by mouth daily. 01/02/24  Yes Boscia, Heather E, NP  tamsulosin (FLOMAX) 0.4 MG CAPS capsule Take 0.4 mg by mouth at bedtime. 01/21/24  Yes [provider]  Vitamin D , Ergocalciferol , (DRISDOL ) 1.25 MG (50000 UNIT) CAPS capsule TAKE 1 CAPSULE (50,000 UNITS TOTAL) BY MOUTH EVERY 7 (SEVEN) DAYS 05/06/22  Yes Raulkar, Krutika P, MD  finasteride (PROSCAR) 5 MG tablet Take 1 tablet by mouth at bedtime. Patient not taking: Reported on 02/25/2024 12/01/23   [provider]  furosemide  (LASIX ) 20 MG tablet USE AS NEEDED FOR UP TO 5 DAYS IF SWELLING IN LEGS RECURS. CONTACT CARDIOLOGY IS SWELLING DOES NOT IMPROVE OR GETS WORSE. Patient not taking: Reported on 02/25/2024 01/26/22   Raulkar, Keven Pel, MD  NITROSTAT  0.4 MG SL tablet PLACE 1 TABLET (0.4 MG TOTAL) UNDER THE TONGUE EVERY 5 (FIVE) MINUTES AS NEEDED FOR CHEST PAIN. Patient not taking: Reported on 02/25/2024 03/31/16   Arty Binning, MD      Allergies    Lisinopril , Amantadines, Clonazepam , and Zoloft  [sertraline ]    Review of Systems   Review of Systems  Unable to perform ROS: Mental status change  All other systems reviewed and are negative.   Physical Exam Updated Vital Signs BP (!) 147/105   Pulse 72   Temp 98.3 F (36.8 C) (Oral)   Resp (!) 23   Ht 5\' 9"  (1.753 m)   Wt 96.8 kg   SpO2 94%   BMI 31.50 kg/m  Physical Exam Vitals and nursing note reviewed.  Constitutional:      General: He is not in acute distress.    Appearance: Normal appearance. He is not ill-appearing.  HENT:     Head: Normocephalic and atraumatic.     Nose: Nose normal.  Eyes:     Conjunctiva/sclera: Conjunctivae normal.  Cardiovascular:     Rate and Rhythm: Normal rate and regular rhythm.  Pulmonary:     Effort: Pulmonary effort is normal. No respiratory  distress.  Abdominal:     General: There is no distension.     Palpations: Abdomen is soft.     Tenderness: There is no abdominal tenderness. There is no guarding.  Musculoskeletal:        General: No deformity. Normal range of motion.     Cervical back: Normal range of motion.  Skin:    Findings: No rash.  Neurological:     Mental Status: He is alert.     ED Results / Procedures / Treatments   Labs (all labs ordered are listed, but only abnormal results are displayed) Labs Reviewed  ACETAMINOPHEN  LEVEL - Abnormal; Notable for the following components:      Result Value   Acetaminophen  (Tylenol ), Serum <10 (*)  All other components within normal limits  AMMONIA - Abnormal; Notable for the following components:   Ammonia 42 (*)    All other components within normal limits  COMPREHENSIVE METABOLIC PANEL WITH GFR - Abnormal; Notable for the following components:   Glucose, Bld 132 (*)    Albumin 3.3 (*)    All other components within normal limits  MAGNESIUM  - Abnormal; Notable for the following components:   Magnesium  1.6 (*)    All other components within normal limits  SALICYLATE LEVEL - Abnormal; Notable for the following components:   Salicylate Lvl <7.0 (*)    All other components within normal limits  URINALYSIS, W/ REFLEX TO CULTURE (INFECTION SUSPECTED) - Abnormal; Notable for the following components:   Color, Urine AMBER (*)    APPearance HAZY (*)    Hgb urine dipstick LARGE (*)    Ketones, ur 5 (*)    Protein, ur 100 (*)    Bacteria, UA FEW (*)    All other components within normal limits  CBC WITH DIFFERENTIAL/PLATELET - Abnormal; Notable for the following components:   Hemoglobin 11.2 (*)    HCT 36.3 (*)    RDW 19.1 (*)    All other components within normal limits  ETHANOL  CBC WITH DIFFERENTIAL/PLATELET    EKG None  Radiology CT HEAD WO CONTRAST ( ) Result Date: 02/25/2024 EXAM: CT HEAD WITHOUT 02/25/2024 09:33:24 PM TECHNIQUE: CT of the head  was performed without the administration of intravenous contrast. Automated exposure control, iterative reconstruction, and/or weight based adjustment of the mA/kV was utilized to reduce the radiation dose to as low as reasonably achievable. COMPARISON: 01/04/2024 CLINICAL HISTORY: Mental status change, unknown cause. Chief complaints: Weakness. FINDINGS: BRAIN AND VENTRICLES: There is no acute intracranial hemorrhage, mass effect or midline shift. No abnormal extra-axial fluid collection. The gray-white differentiation is maintained without evidence of an acute infarct. There is no evidence of hydrocephalus. Mild scattered periventricular and subcortical low density, likely reflecting small vessel ischemic changes. Intracranial arteriosclerosis. ORBITS: The visualized portion of the orbits demonstrate no acute abnormality. SINUSES: Opacification of the left frontal sinus. Partial opacification of the bilateral ethmoid and maxillary sinuses. SOFT TISSUES AND SKULL: No acute abnormality of the visualized skull or soft tissues. IMPRESSION: 1. No acute intracranial abnormality. 2. Atrophy with mild small vessel ischemic changes. Electronically signed by: Zadie Herter MD 02/25/2024 09:55 PM EDT RP Workstation: [id number]   DG Chest Port 1 View Result Date: 02/25/2024 EXAM: 1 VIEW(S) XRAY OF THE CHEST 02/25/2024 08:04:56 PM COMPARISON: CT chest 02/02/2024 CLINICAL HISTORY: Altered. FINDINGS: LUNGS AND PLEURA: Mild left basilar scarring/atelectasis. No consolidation. No pulmonary edema. No pleural effusion. No pneumothorax. HEART AND MEDIASTINUM: Left subclavian pacemaker. Thoracic aortic atherosclerosis. No acute abnormality of the cardiac and mediastinal silhouettes. BONES AND SOFT TISSUES: No acute osseous abnormality. IMPRESSION: 1. No acute cardiopulmonary pathology. Electronically signed by: Zadie Herter MD 02/25/2024 09:53 PM EDT RP Workstation: [id number]    Procedures Procedures    Medications  Ordered in ED Medications  sodium chloride  0.9 % bolus 500 mL (500 mLs Intravenous New Bag/Given 02/25/24 2204)    ED Course/ Medical Decision Making/ A&P                                 Medical Decision Making Amount and/or Complexity of Data Reviewed Labs: ordered. Radiology: ordered.  Risk Prescription drug management. Decision regarding hospitalization.   Medical Decision Making / ED  Course   This patient presents to the ED for concern of confusion, weakness, this involves an extensive number of treatment options, and is a complaint that carries with it a high risk of complications and morbidity.  The differential diagnosis includes UTI, TIA, CVA, intracranial injury, pneumonia  MDM: 83 year old male presents today for concern of confusion and weakness.  He does have history of Parkinson's however but this is unusual for his baseline.  Patient was unable to ambulate today which is not usual for him.  He was unsure of where he was which is unusual for him.  CBC is without leukocytosis.  Minimal anemia which is around his baseline.  CMP is with preserved renal function, normal electrolytes.  UA without evidence of UTI.  He does have a hemoglobin in his UA which is chronic.  Magnesium  1.6 will replete this.  Acetaminophen  within normal, level within normal, salicylate within normal.  Minimally elevated.  Will admit due to significant  confusion and weakness which are new.  Discussed with hospitalist will evaluate patient for admission.     Additional history obtained: -Additional history obtained from wife at bedside -External records from outside source obtained and reviewed including: Chart review including previous notes, labs, imaging, consultation notes   Lab Tests: -I ordered, reviewed, and interpreted labs.   The pertinent results include:   Labs Reviewed  ACETAMINOPHEN  LEVEL - Abnormal; Notable for the following components:      Result Value   Acetaminophen   (Tylenol ), Serum <10 (*)    All other components within normal limits  AMMONIA - Abnormal; Notable for the following components:   Ammonia 42 (*)    All other components within normal limits  COMPREHENSIVE METABOLIC PANEL WITH GFR - Abnormal; Notable for the following components:   Glucose, Bld 132 (*)    Albumin 3.3 (*)    All other components within normal limits  MAGNESIUM  - Abnormal; Notable for the following components:   Magnesium  1.6 (*)    All other components within normal limits  SALICYLATE LEVEL - Abnormal; Notable for the following components:   Salicylate Lvl <7.0 (*)    All other components within normal limits  URINALYSIS, W/ REFLEX TO CULTURE (INFECTION SUSPECTED) - Abnormal; Notable for the following components:   Color, Urine AMBER (*)    APPearance HAZY (*)    Hgb urine dipstick LARGE (*)    Ketones, ur 5 (*)    Protein, ur 100 (*)    Bacteria, UA FEW (*)    All other components within normal limits  CBC WITH DIFFERENTIAL/PLATELET - Abnormal; Notable for the following components:   Hemoglobin 11.2 (*)    HCT 36.3 (*)    RDW 19.1 (*)    All other components within normal limits  ETHANOL  CBC WITH DIFFERENTIAL/PLATELET      EKG  EKG Interpretation Date/Time:    Ventricular Rate:    PR Interval:    QRS Duration:    QT Interval:    QTC Calculation:   R Axis:      Text Interpretation:           Imaging Studies ordered: I ordered imaging studies including CT head, chest x-ray I independently visualized and interpreted imaging. I agree with the radiologist interpretation   Medicines ordered and prescription drug management: Meds ordered this encounter  Medications   sodium chloride  0.9 % bolus 500 mL    -I have reviewed the patients home medicines and have made adjustments as needed  Social Determinants of Health:  Factors impacting patients care include: Good family support   Reevaluation: After the interventions noted above, I  reevaluated the patient and found that they have :stayed the same  Co morbidities that complicate the patient evaluation  Past Medical History:  Diagnosis Date   Arthritis    R shoulder, bone spur   Arthritis    "hands" (03/25/2016)   Chronic diastolic heart failure (HCC)    Chronic lower back pain    Coronary heart disease    Dr Ollie Bhat   Diverticulitis 12/2021   DVT (deep venous thrombosis) (HCC) 05/2022   left leg   H/O cardiovascular stress test    perhaps last one was 2009   Heart murmur    12/29/11 echo: mild MR, no AS, trivial TR   History of gout    Hyperlipidemia    Hypertension    saw Dr. Pauletta Boroughs last earl;y- 2014, cardiac cath. last done ?2009, blocks seen didn't require any intervention at that point.   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   Final Clinical Impression(s) / ED Diagnoses Final diagnoses:  Generalized weakness  Confusion    Rx / DC Orders ED Discharge Orders     None         Lucina Sabal, PA-C 02/25/24 2343    Afton Horse T, DO 02/26/24 1524

## 2024-02-25 NOTE — ED Triage Notes (Signed)
 Pt BIB [facility] from home d/t gen weakness for weeks and possible UTI.  Pt has hx of Parkinson's and has trouble getting out words.

## 2024-02-26 ENCOUNTER — Observation Stay (HOSPITAL_COMMUNITY)

## 2024-02-26 DIAGNOSIS — Z8673 Personal history of transient ischemic attack (TIA), and cerebral infarction without residual deficits: Secondary | ICD-10-CM | POA: Diagnosis not present

## 2024-02-26 DIAGNOSIS — E785 Hyperlipidemia, unspecified: Secondary | ICD-10-CM | POA: Diagnosis not present

## 2024-02-26 DIAGNOSIS — E1169 Type 2 diabetes mellitus with other specified complication: Secondary | ICD-10-CM | POA: Diagnosis not present

## 2024-02-26 DIAGNOSIS — G9340 Encephalopathy, unspecified: Secondary | ICD-10-CM | POA: Diagnosis not present

## 2024-02-26 DIAGNOSIS — Z86718 Personal history of other venous thrombosis and embolism: Secondary | ICD-10-CM | POA: Diagnosis not present

## 2024-02-26 LAB — BLOOD GAS, ARTERIAL
Acid-Base Excess: 1.2 mmol/L (ref 0.0–2.0)
Bicarbonate: 26 mmol/L (ref 20.0–28.0)
O2 Saturation: 93.6 %
Patient temperature: 36.7
pCO2 arterial: 40 mmHg (ref 32–48)
pH, Arterial: 7.41 (ref 7.35–7.45)
pO2, Arterial: 62 mmHg — ABNORMAL LOW (ref 83–108)

## 2024-02-26 LAB — GLUCOSE, CAPILLARY
Glucose-Capillary: 113 mg/dL — ABNORMAL HIGH (ref 70–99)
Glucose-Capillary: 133 mg/dL — ABNORMAL HIGH (ref 70–99)
Glucose-Capillary: 163 mg/dL — ABNORMAL HIGH (ref 70–99)
Glucose-Capillary: 143 mg/dL — ABNORMAL HIGH (ref 70–99)

## 2024-02-26 LAB — CBC
HCT: 34.7 % — ABNORMAL LOW (ref 39.0–52.0)
Hemoglobin: 10.8 g/dL — ABNORMAL LOW (ref 13.0–17.0)
MCH: 26.1 pg (ref 26.0–34.0)
MCHC: 31.1 g/dL (ref 30.0–36.0)
MCV: 83.8 fL (ref 80.0–100.0)
Platelets: 255 10*3/uL (ref 150–400)
RBC: 4.14 MIL/uL — ABNORMAL LOW (ref 4.22–5.81)
RDW: 19 % — ABNORMAL HIGH (ref 11.5–15.5)
WBC: 9.6 10*3/uL (ref 4.0–10.5)
nRBC: 0 % (ref 0.0–0.2)

## 2024-02-26 LAB — BASIC METABOLIC PANEL WITH GFR
Anion gap: 10 (ref 5–15)
BUN: 16 mg/dL (ref 8–23)
CO2: 24 mmol/L (ref 22–32)
Calcium: 9.3 mg/dL (ref 8.9–10.3)
Chloride: 106 mmol/L (ref 98–111)
Creatinine, Ser: 0.87 mg/dL (ref 0.61–1.24)
GFR, Estimated: >60 mL/min (ref >60)
Glucose, Bld: 121 mg/dL — ABNORMAL HIGH (ref 70–99)
Potassium: 4.2 mmol/L (ref 3.5–5.1)
Sodium: 140 mmol/L (ref 135–145)

## 2024-02-26 LAB — PHOSPHORUS: Phosphorus: 3.3 mg/dL (ref 2.5–4.6)

## 2024-02-26 LAB — CBG MONITORING, ED: Glucose-Capillary: 114 mg/dL — ABNORMAL HIGH (ref 70–99)

## 2024-02-26 LAB — AMMONIA: Ammonia: <13 umol/L (ref 9–35)

## 2024-02-26 LAB — HEMOGLOBIN A1C
Hgb A1c MFr Bld: 6.8 % — ABNORMAL HIGH (ref 4.8–5.6)
Mean Plasma Glucose: 148.46 mg/dL

## 2024-02-26 LAB — MAGNESIUM: Magnesium: 2.2 mg/dL (ref 1.7–2.4)

## 2024-02-26 LAB — TSH: TSH: 0.796 u[IU]/mL (ref 0.350–4.500)

## 2024-02-26 MED ORDER — SODIUM CHLORIDE 0.9 % IV SOLN
INTRAVENOUS | Status: DC
  Administered 2024-02-26: 1000 mL via INTRAVENOUS

## 2024-02-26 MED ORDER — TAMSULOSIN HCL 0.4 MG PO CAPS
0.4000 mg | ORAL_CAPSULE | Freq: Every day | ORAL | Status: DC
  Administered 2024-02-26 – 2024-03-01 (×5): 0.4 mg via ORAL
  Filled 2024-02-26 (×5): qty 1

## 2024-02-26 MED ORDER — ONDANSETRON HCL 4 MG PO TABS: 4.0000 mg | ORAL_TABLET | Freq: Four times a day (QID) | ORAL | Status: DC | PRN

## 2024-02-26 MED ORDER — ACETAMINOPHEN 650 MG RE SUPP: 650.0000 mg | Freq: Four times a day (QID) | RECTAL | Status: DC | PRN

## 2024-02-26 MED ORDER — QUETIAPINE FUMARATE 25 MG PO TABS
12.5000 mg | ORAL_TABLET | Freq: Every day | ORAL | Status: DC
  Administered 2024-02-26: 12.5 mg via ORAL
  Filled 2024-02-26: qty 1

## 2024-02-26 MED ORDER — CARBIDOPA-LEVODOPA ER 50-200 MG PO TBCR
1.0000 | EXTENDED_RELEASE_TABLET | Freq: Four times a day (QID) | ORAL | Status: DC
  Administered 2024-02-26 – 2024-03-02 (×21): 1 via ORAL
  Filled 2024-02-26 (×24): qty 1

## 2024-02-26 MED ORDER — MELATONIN 5 MG PO TABS
10.0000 mg | ORAL_TABLET | Freq: Every day | ORAL | Status: DC
  Administered 2024-02-26 – 2024-03-01 (×6): 10 mg via ORAL
  Filled 2024-02-26 (×6): qty 2

## 2024-02-26 MED ORDER — INSULIN ASPART 100 UNIT/ML IJ SOLN: 0.0000 [IU] | Freq: Every day | INTRAMUSCULAR | Status: DC

## 2024-02-26 MED ORDER — INSULIN ASPART 100 UNIT/ML IJ SOLN
0.0000 [IU] | Freq: Three times a day (TID) | INTRAMUSCULAR | Status: DC
  Administered 2024-02-26 – 2024-03-01 (×5): 1 [IU] via SUBCUTANEOUS
  Administered 2024-03-01 (×2): 2 [IU] via SUBCUTANEOUS
  Administered 2024-03-02: 1 [IU] via SUBCUTANEOUS

## 2024-02-26 MED ORDER — ACETAMINOPHEN 325 MG PO TABS
650.0000 mg | ORAL_TABLET | Freq: Four times a day (QID) | ORAL | Status: DC | PRN
  Administered 2024-02-26: 650 mg via ORAL
  Filled 2024-02-26: qty 2

## 2024-02-26 MED ORDER — POLYETHYLENE GLYCOL 3350 17 G PO PACK
17.0000 g | PACK | Freq: Every day | ORAL | Status: DC | PRN
  Administered 2024-03-01: 17 g via ORAL
  Filled 2024-02-26: qty 1

## 2024-02-26 MED ORDER — SODIUM CHLORIDE 0.9% FLUSH
3.0000 mL | Freq: Two times a day (BID) | INTRAVENOUS | Status: DC
  Administered 2024-02-26 – 2024-03-01 (×9): 3 mL via INTRAVENOUS

## 2024-02-26 MED ORDER — BISACODYL 5 MG PO TBEC
5.0000 mg | DELAYED_RELEASE_TABLET | Freq: Every day | ORAL | Status: DC | PRN
  Administered 2024-03-01: 5 mg via ORAL
  Filled 2024-02-26: qty 1

## 2024-02-26 MED ORDER — MODAFINIL 100 MG PO TABS
200.0000 mg | ORAL_TABLET | Freq: Every day | ORAL | Status: DC
  Administered 2024-02-26 – 2024-03-02 (×6): 200 mg via ORAL
  Filled 2024-02-26 (×6): qty 2

## 2024-02-26 MED ORDER — RIVAROXABAN 20 MG PO TABS
20.0000 mg | ORAL_TABLET | Freq: Every day | ORAL | Status: DC
  Administered 2024-02-26 – 2024-03-01 (×5): 20 mg via ORAL
  Filled 2024-02-26 (×5): qty 1

## 2024-02-26 MED ORDER — ATORVASTATIN CALCIUM 10 MG PO TABS
20.0000 mg | ORAL_TABLET | Freq: Every day | ORAL | Status: DC
  Administered 2024-02-26 – 2024-03-01 (×5): 20 mg via ORAL
  Filled 2024-02-26 (×5): qty 2

## 2024-02-26 MED ORDER — ONDANSETRON HCL 4 MG/2ML IJ SOLN: 4.0000 mg | Freq: Four times a day (QID) | INTRAMUSCULAR | Status: DC | PRN

## 2024-02-26 NOTE — Progress Notes (Signed)
Routine eeg

## 2024-02-26 NOTE — Procedures (Signed)
 History: 83 yo M with encephalopathy  EEG Duration: 24 minutes  Sedation: None  Patient State: Awake and asleep  Technique: This EEG was acquired with electrodes placed according to the International 10-20 electrode system (including Fp1, Fp2, F3, F4, C3, C4, P3, P4, O1, O2, T3, T4, T5, T6, A1, A2, Fz, Cz, Pz). The following electrodes were missing or displaced: none.   Background: There is a posterior dominant rhythm that is reactive to eye opening and closure that has a frequency of 7 Hz.  In addition there is generalized irregular delta and theta range activities.  There are also discharges with triphasic morphology that her at times periodic without evolution.  No epileptiform activity was seen  Photic stimulation: Physiologic driving is not performed  EEG Abnormalities: 1) triphasic waves 2) generalized irregular slow activity 3) slow posterior dominant rhythm  Clinical Interpretation: This EEG is consistent with a generalized nonspecific cerebral dysfunction (encephalopathy). There was no seizure or seizure predisposition recorded on this study. Please note that lack of epileptiform activity on EEG does not preclude the possibility of epilepsy.   Ann Keto, MD [facility]   If 7pm- 7am, please page neurology on call as listed in AMION.

## 2024-02-26 NOTE — Progress Notes (Addendum)
 PROGRESS NOTE        PATIENT DETAILS Name: Jordan [name] Age: 83 y.o. Sex: male Date of Birth: [birth year] Admit Date: 02/25/2024 Admitting Physician Walton Guppy, MD ZOX:[id number], Amiel Kalata, MD  Brief Summary: Patient is a 83 y.o.  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  Significant events: 4/26>> admit to [facility]  Significant studies: 4/26>> CXR: No PNA 4/26>> CT head: No acute abnormality 4/26>> UA: 0-5 WBC. 4/27>> TSH: Normal 4/27>>NH4: normal  Significant microbiology data: None  Procedures: None  Consults: None  Subjective: Sleeping-answering questions in 1-2 words-barely opens eyes-moving all 4 extremities.  No family at bedside.  Objective: Vitals: Blood pressure 115/79, pulse 67, temperature 98 F (36.7 C), temperature source Oral, resp. rate 17, height 5\' 9"  (1.753 m), weight 96.8 kg, SpO2 91%.   Exam: Gen Exam:not in any distress HEENT:atraumatic, normocephalic Chest: B/L clear to auscultation anteriorly CVS:S1S2 regular Abdomen:soft non tender, non distended Extremities:no edema Neurology: Non focal-but with diffuse generalized weakness. Skin: no rash  Pertinent Labs/Radiology:    Latest Ref Rng & Units 02/26/2024    4:32 AM 02/25/2024    9:44 PM 02/07/2024    3:18 PM  CBC  WBC 4.0 - 10.5 K/uL 9.6  9.5  11.3   Hemoglobin 13.0 - 17.0 g/dL 40.9  81.1  91.4   Hematocrit 39.0 - 52.0 % 34.7  36.3  32.7    32.7   Platelets 150 - 400 K/uL 255  264  305     Lab Results  Component Value Date   NA 140 02/26/2024   K 4.2 02/26/2024   CL 106 02/26/2024   CO2 24 02/26/2024      Assessment/Plan: Acute metabolic encephalopathy Failure to thrive syndrome Unclear etiology Does not appear toxic/sick appearing.However still sleepy Electrolytes/TSH/ammonia levels all normal Does not appear to be on  benzos/narcotics however is on Seroquel  (for visual hallucinations) Since still sleepy-will stop Seroquel  today and see how he does. Check blood cultures but doubt infection. Check EEG/ABG Mobilize with PT/OT-and see how he does. If no improvement-may need to consult neurology-unclear if this is related to his underlying Parkinson's.  History of Parkinson's disease Continue Sinemet   History of visual hallucinations Stopping Seroquel  for now to see if this improves his fatigue/confusion-continue melatonin.  History of daytime somnolence Worse over the last several days-see above On modafinil   History of orthostatic hypotension related to Parkinson's disease History of worsening and debility/weakness Previously able to use a walker and ambulate around the house-now barely able to ambulate. See above plans-regarding workup-possible neurology evaluation at some point Continue to work with PT/OT.  History of CVA Nonfocal exam Not on antiplatelets as an anticoagulation Continue statin.  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   Weight as of this encounter: 96.8 kg.  Code status:   Code Status: Full Code   DVT Prophylaxis: rivaroxaban  (XARELTO ) tablet 20 mg     Family Communication: None at bedside   Disposition Plan: Status is: Observation The patient will require care spanning > 2 midnights and should be moved to inpatient because: Severity of illness   Planned Discharge  Destination:Home health versus SNF   Diet: Diet Order             Diet regular Room service appropriate? Yes; Fluid consistency: Thin  Diet effective now                     Antimicrobial agents: Anti-infectives (From admission, onward)    None        MEDICATIONS: Scheduled Meds:  atorvastatin   20 mg Oral QHS   carbidopa -levodopa   1 tablet Oral QID   insulin  aspart  0-5 Units Subcutaneous QHS   insulin  aspart  0-6 Units Subcutaneous TID WC   melatonin  10 mg Oral QHS   modafinil   200 mg Oral Daily   QUEtiapine   12.5 mg Oral QHS   rivaroxaban   20 mg Oral Q supper   sodium chloride  flush  3 mL Intravenous Q12H   tamsulosin  0.4 mg Oral QHS   Continuous Infusions:  sodium chloride  1,000 mL (02/26/24 0142)   PRN Meds:.acetaminophen  **OR** acetaminophen , bisacodyl , ondansetron  **OR** ondansetron  (ZOFRAN ) IV, polyethylene glycol   I have personally reviewed following labs and imaging studies  LABORATORY DATA: CBC: Recent Labs  Lab 02/25/24 2144 02/26/24 0432  WBC 9.5 9.6  NEUTROABS 5.9  --   HGB 11.2* 10.8*  HCT 36.3* 34.7*  MCV 84.4 83.8  PLT 264 255    Basic Metabolic Panel: Recent Labs  Lab 02/25/24 2015 02/26/24 0432  NA 137 140  K 4.4 4.2  CL 104 106  CO2 23 24  GLUCOSE 132* 121*  BUN 17 16  CREATININE 0.93 0.87  CALCIUM  9.3 9.3  MG 1.6* 2.2  PHOS  --  3.3    GFR: Estimated Creatinine Clearance: 72.5 mL/min (by C-G formula based on SCr of 0.87 mg/dL).  Liver Function Tests: Recent Labs  Lab 02/25/24 2015  AST 21  ALT 11  ALKPHOS 48  BILITOT 0.3  PROT 6.7  ALBUMIN 3.3*   No results for input(s): "LIPASE", "AMYLASE" in the last 168 hours. Recent Labs  Lab 02/25/24 2015 02/26/24 0814  AMMONIA 42* <13    Coagulation Profile: No results for input(s): "INR", "PROTIME" in the last 168 hours.  Cardiac Enzymes: No results for input(s): "CKTOTAL", "CKMB", "CKMBINDEX", "TROPONINI" in the last 168 hours.  BNP (last 3  results) No results for input(s): "PROBNP" in the last 8760 hours.  Lipid Profile: No results for input(s): "CHOL", "HDL", "LDLCALC", "TRIG", "CHOLHDL", "LDLDIRECT" in the last 72 hours.  Thyroid  Function Tests: Recent Labs    02/26/24 0432  TSH 0.796    Anemia Panel: No results for input(s): "VITAMINB12", "FOLATE", "FERRITIN", "TIBC", "IRON", "RETICCTPCT" in the last 72 hours.  Urine analysis:    Component Value Date/Time   COLORURINE AMBER (A) 02/25/2024 2206   APPEARANCEUR HAZY (A) 02/25/2024 2206   LABSPEC 1.015 02/25/2024 2206   PHURINE 6.0 02/25/2024 2206   GLUCOSEU NEGATIVE 02/25/2024 2206   HGBUR LARGE (A) 02/25/2024 2206   BILIRUBINUR NEGATIVE 02/25/2024 2206   KETONESUR 5 (A) 02/25/2024 2206   PROTEINUR 100 (A) 02/25/2024 2206   NITRITE NEGATIVE 02/25/2024 2206   LEUKOCYTESUR NEGATIVE 02/25/2024 2206  Sepsis Labs: Lactic Acid, Venous    Component Value Date/Time   LATICACIDVEN 1.0 01/01/2022 0531    MICROBIOLOGY: No results found for this or any previous visit (from the past 240 hours).  RADIOLOGY STUDIES/RESULTS: CT HEAD WO CONTRAST ( ) Result Date: 02/25/2024 EXAM: CT HEAD WITHOUT 02/25/2024 09:33:24 PM TECHNIQUE: CT of the head was performed without the administration of intravenous contrast. Automated exposure control, iterative reconstruction, and/or weight based adjustment of the mA/kV was utilized to reduce the radiation dose to as low as reasonably achievable. COMPARISON: 01/04/2024 CLINICAL HISTORY: Mental status change, unknown cause. Chief complaints: Weakness. FINDINGS: BRAIN AND VENTRICLES: There is no acute intracranial hemorrhage, mass effect or midline shift. No abnormal extra-axial fluid collection. The gray-white differentiation is maintained without evidence of an acute infarct. There is no evidence of hydrocephalus. Mild scattered periventricular and subcortical low density, likely reflecting small vessel ischemic changes. Intracranial  arteriosclerosis. ORBITS: The visualized portion of the orbits demonstrate no acute abnormality. SINUSES: Opacification of the left frontal sinus. Partial opacification of the bilateral ethmoid and maxillary sinuses. SOFT TISSUES AND SKULL: No acute abnormality of the visualized skull or soft tissues. IMPRESSION: 1. No acute intracranial abnormality. 2. Atrophy with mild small vessel ischemic changes. Electronically signed by: Zadie Herter MD 02/25/2024 09:55 PM EDT RP Workstation: [id number]   DG Chest Port 1 View Result Date: 02/25/2024 EXAM: 1 VIEW(S) XRAY OF THE CHEST 02/25/2024 08:04:56 PM COMPARISON: CT chest 02/02/2024 CLINICAL HISTORY: Altered. FINDINGS: LUNGS AND PLEURA: Mild left basilar scarring/atelectasis. No consolidation. No pulmonary edema. No pleural effusion. No pneumothorax. HEART AND MEDIASTINUM: Left subclavian pacemaker. Thoracic aortic atherosclerosis. No acute abnormality of the cardiac and mediastinal silhouettes. BONES AND SOFT TISSUES: No acute osseous abnormality. IMPRESSION: 1. No acute cardiopulmonary pathology. Electronically signed by: Zadie Herter MD 02/25/2024 09:53 PM EDT RP Workstation: [id number]     LOS: 0 days   Kimberly Penna, MD  [facility]    To contact the attending provider between 7A-7P or the covering provider during after hours 7P-7A, please log into the web site [email or web address] and access using universal [place] password for that web site. If you do not have the password, please call the hospital operator.  02/26/2024, 9:41 AM

## 2024-02-26 NOTE — H&P (Signed)
 History and Physical    Jordan [name]:[id number] DOB: 09-02-1941 DOA: 02/25/2024  PCP: Roselind Congo, MD   Patient coming from: Home   Chief Complaint: Fatigue, confusion, loss of appetite   HPI: Jordan [name] is a 83 y.o. male with medical history significant for Parkinson disease, history of DVT on Xarelto , type 2 diabetes mellitus, OSA on CPAP, and metastatic neuroendocrine tumor currently under observation who presents with increased fatigue, confusion, and loss of appetite.  The patient's wife reports that the patient has had worsening fatigue, generalized weakness, and confusion over the course of months but was much worse yesterday.  He was unable to ambulate on his own, was not aware of where he was, and has been much more fatigued than usual.  Patient has been complaining of fatigue but denied chest pain, abdominal pain, fever, or chills.  No focal weakness has been noted by family.  ED Course: Upon arrival to the ED, patient is found to be afebrile and saturating mid 90s on room air with normal HR and stable BP.  Head CT is negative for acute findings.  Labs are most notable for magnesium  1.6, normal WBC, and normal creatinine.  Patient was treated in the ED with 500 mL of NS and 2 g IV magnesium .  Review of Systems:  ROS limited by patient's clinical condition.  Past Medical History:  Diagnosis Date   Arthritis    R shoulder, bone spur   Arthritis    "hands" (03/25/2016)   Chronic diastolic heart failure (HCC)    Chronic lower back pain    Coronary heart disease    Dr Ollie Bhat   Diverticulitis 12/2021   DVT (deep venous thrombosis) (HCC) 05/2022   left leg   H/O cardiovascular stress test    perhaps last one was 2009   Heart murmur    12/29/11 echo: mild MR, no AS, trivial TR   History of gout    Hyperlipidemia    Hypertension    saw Dr. Pauletta Boroughs last earl;y- 2014, cardiac cath. last done ?2009, blocks seen didn't require any intervention at that point.    Narcolepsy    [name] 04-11-97; Mean Latency 1.20min, SOREM 2   OSA on CPAP    NPSG 12-13-98 AHI 22.7   PONV (postoperative nausea and vomiting)    Presence of permanent cardiac pacemaker    Shortness of breath    with exertion    Stroke (HCC)    Type II diabetes mellitus (HCC)     Past Surgical History:  Procedure Laterality Date   CARDIAC CATHETERIZATION  2009?   EP IMPLANTABLE DEVICE N/A 03/25/2016   Procedure: Pacemaker Implant - Dual Chamber;  Surgeon: Tammie Fall, MD;  Location: [facility] INVASIVE CV LAB;  Service: Cardiovascular;  Laterality: N/A;   FRACTURE SURGERY     HIP FRACTURE SURGERY     INSERT / REPLACE / REMOVE PACEMAKER  03/24/2016   INTRAMEDULLARY (IM) NAIL INTERTROCHANTERIC Left 02/08/2022   Procedure: INTRAMEDULLARY (IM) NAIL INTERTROCHANTRIC;  Surgeon: Jasmine Mesi, MD;  Location: [facility] OR;  Service: Orthopedics;  Laterality: Left;   IR KYPHO LUMBAR INC FX REDUCE BONE BX UNI/BIL CANNULATION INC/IMAGING  12/29/2021   IR RADIOLOGIST EVAL & MGMT  12/15/2021   IR RADIOLOGY PERIPHERAL GUIDED IV START  07/14/2022   IR US  GUIDE VASC ACCESS RIGHT  07/15/2022   LEFT AND RIGHT HEART CATHETERIZATION WITH CORONARY ANGIOGRAM N/A 06/05/2014   Procedure: LEFT AND RIGHT HEART CATHETERIZATION WITH  CORONARY ANGIOGRAM;  Surgeon: Mickiel Albany, MD;  Location[facility] CATH LAB;  Service: Cardiovascular;  Laterality: N/A;   NASAL FRACTURE SURGERY     SHOULDER ARTHROSCOPY WITH ROTATOR CUFF REPAIR AND SUBACROMIAL DECOMPRESSION Right 08/16/2013   Procedure: SHOULDER ARTHROSCOPY WITH ROTATOR CUFF REPAIR AND SUBACROMIAL DECOMPRESSION;  Surgeon: Derald Flattery, MD;  Location: [facility] OR;  Service: Orthopedics;  Laterality: Right;  Right shoulder arthroscopy rotator cuff repair, subacromial decompression.   TONSILLECTOMY      Social History:   reports that he quit smoking about 56 years ago. His smoking use included cigarettes. He started smoking about 62 years ago. He has a 15 pack-year  smoking history. He has never used smokeless tobacco. He reports current alcohol  use. He reports that he does not use drugs.  Allergies  Allergen Reactions   Lisinopril      Unknown reaction   Amantadines Other (See Comments)    Mental changes   Clonazepam  Other (See Comments)    Mental changes   Zoloft  [Sertraline ] Other (See Comments)    Mental changes    Family History  Problem Relation Age of Onset   Sleep apnea Sister    Colon cancer Sister      Prior to Admission medications   Medication Sig Start Date End Date Taking? Authorizing Provider  acetaminophen  (TYLENOL ) 500 MG tablet Take 500-1,000 mg by mouth every 6 (six) hours as needed for moderate pain or mild pain.   Yes [provider]  atorvastatin  (LIPITOR) 40 MG tablet Take 0.5 tablets (20 mg total) by mouth daily. Patient taking differently: Take 20 mg by mouth at bedtime. 07/30/21  Yes Medina-Vargas, Monina C, NP  b complex vitamins capsule Take 1 capsule by mouth at bedtime.   Yes [provider]  bifidobacterium infantis (ALIGN) capsule Take 1 capsule by mouth daily.   Yes [provider]  Camphor-Menthol-Methyl Sal (HM SALONPAS PAIN RELIEF EX) Apply 1 patch topically daily as needed (Back pain).   Yes [provider]  carbidopa -levodopa  (SINEMET  CR) 50-200 MG tablet Take 1 tablet by mouth 4 (four) times daily. 02/09/24  Yes McCue, Camilo Cella, NP  Cholecalciferol  (VITAMIN D ) 50 MCG (2000 UT) CAPS Take 2,000 Units by mouth at bedtime.   Yes [provider]  clotrimazole-betamethasone (LOTRISONE) cream Apply 1 Application topically daily as needed (Rash). 06/21/22  Yes [provider]  docusate sodium  (COLACE) 100 MG capsule Take 1 capsule (100 mg total) by mouth 2 (two) times daily. Patient taking differently: Take 100 mg by mouth daily as needed for mild constipation or moderate constipation. As Needed 02/17/22  Yes Akula, Vijaya, MD  feeding supplement, GLUCERNA SHAKE,  (GLUCERNA SHAKE) LIQD Take 237 mLs by mouth 2 (two) times daily between meals. Alternate with ensure Diabetic   Yes [provider]  lidocaine  (LIDODERM ) 5 % Place 1 patch onto the skin daily as needed. Purchase over the counter. On for 12 hours and off for 12 hours 01/18/22  Yes Love, Renay Carota, PA-C  magnesium  gluconate (MAGONATE) 500 MG tablet Take 0.5 tablets (250 mg total) by mouth at bedtime. 03/24/22  Yes Raulkar, Keven Pel, MD  Melatonin 10 MG TABS Take 10 mg by mouth at bedtime.   Yes [provider]  metFORMIN  (GLUCOPHAGE ) 500 MG tablet Take 1,000 mg by mouth 2 (two) times daily. 1000 mg in the am and 1000 mg at night   Yes [provider]  Methylcobalamin 5000 MCG CHEW Chew 500 mcg by mouth at  bedtime.   Yes [provider]  modafinil  (PROVIGIL ) 200 MG tablet TAKE 1 TABLET BY MOUTH EVERY DAY 12/12/23  Yes Young, Rupert Counts D, MD  Multiple Vitamin (MULTIVITAMIN WITH MINERALS) TABS tablet Take 1 tablet by mouth daily. 02/19/18  Yes Barbee Lew, MD  Omega-3 Fatty Acids (OMEGA 3 PO) Take 1,280 mg by mouth at bedtime.   Yes [provider]  OVER THE COUNTER MEDICATION Take 72 mg by mouth in the morning and at bedtime. Ferrasorb   Yes [provider]  polyethylene glycol (MIRALAX  / GLYCOLAX ) 17 g packet Take 17 g by mouth daily. Patient taking differently: Take 17 g by mouth daily as needed for mild constipation. 01/09/22  Yes Lesa Rape, MD  polyvinyl alcohol  (LIQUIFILM TEARS) 1.4 % ophthalmic solution Place 1 drop into both eyes as needed for dry eyes. Patient taking differently: Place 1 drop into both eyes daily as needed for dry eyes. 01/18/22  Yes Love, Renay Carota, PA-C  QUEtiapine  (SEROQUEL ) 25 MG tablet Take 1 tablet (25 mg total) by mouth at bedtime. Patient taking differently: Take 12.5 mg by mouth at bedtime. 01/18/24  Yes Raulkar, Keven Pel, MD  rivaroxaban  (XARELTO ) 20 MG TABS tablet Take 1 tablet (20 mg total) by mouth daily. 01/02/24   Yes Boscia, Heather E, NP  tamsulosin (FLOMAX) 0.4 MG CAPS capsule Take 0.4 mg by mouth at bedtime. 01/21/24  Yes [provider]  Vitamin D , Ergocalciferol , (DRISDOL ) 1.25 MG (50000 UNIT) CAPS capsule TAKE 1 CAPSULE (50,000 UNITS TOTAL) BY MOUTH EVERY 7 (SEVEN) DAYS 05/06/22  Yes Raulkar, Krutika P, MD  finasteride (PROSCAR) 5 MG tablet Take 1 tablet by mouth at bedtime. Patient not taking: Reported on 02/25/2024 12/01/23   [provider]  furosemide  (LASIX ) 20 MG tablet USE AS NEEDED FOR UP TO 5 DAYS IF SWELLING IN LEGS RECURS. CONTACT CARDIOLOGY IS SWELLING DOES NOT IMPROVE OR GETS WORSE. Patient not taking: Reported on 02/25/2024 01/26/22   Raulkar, Keven Pel, MD  NITROSTAT  0.4 MG SL tablet PLACE 1 TABLET (0.4 MG TOTAL) UNDER THE TONGUE EVERY 5 (FIVE) MINUTES AS NEEDED FOR CHEST PAIN. Patient not taking: Reported on 02/25/2024 03/31/16   Arty Binning, MD    Physical Exam: Vitals:   02/25/24 2330 02/25/24 2345 02/26/24 0000 02/26/24 0022  BP: (!) 140/90 (!) 162/95 (!) 138/109   Pulse: 67 77 71   Resp: 13 15 17    Temp:    98.5 F (36.9 C)  TempSrc:      SpO2: 95% 93% 98%   Weight:      Height:        Constitutional: NAD, no diaphoresis   Eyes: PERTLA, lids and conjunctivae normal ENMT: Mucous membranes are dry. Posterior pharynx clear of any exudate or lesions.   Neck: supple, no masses  Respiratory: no wheezing, no crackles. No accessory muscle use.  Cardiovascular: S1 & S2 heard, regular rate and rhythm. No extremity edema.   Abdomen: No tenderness, soft. Bowel sounds active.  Musculoskeletal: no clubbing / cyanosis. No joint deformity upper and lower extremities.   Skin: no significant rashes, lesions, ulcers. Warm, dry, well-perfused. Neurologic: CN 2-12 grossly intact. Somnolent, wakes to voice. Moving all extremities. Oriented to self only.  Psychiatric: Calm. Cooperative.    Labs and Imaging on Admission: I have personally reviewed following labs and  imaging studies  CBC: Recent Labs  Lab 02/25/24 2144  WBC 9.5  NEUTROABS 5.9  HGB 11.2*  HCT 36.3*  MCV 84.4  PLT 264   Basic Metabolic Panel: Recent Labs  Lab 02/25/24 2015  NA 137  K 4.4  CL 104  CO2 23  GLUCOSE 132*  BUN 17  CREATININE 0.93  CALCIUM  9.3  MG 1.6*   GFR: Estimated Creatinine Clearance: 67.8 mL/min (by C-G formula based on SCr of 0.93 mg/dL). Liver Function Tests: Recent Labs  Lab 02/25/24 2015  AST 21  ALT 11  ALKPHOS 48  BILITOT 0.3  PROT 6.7  ALBUMIN 3.3*   No results for input(s): "LIPASE", "AMYLASE" in the last 168 hours. Recent Labs  Lab 02/25/24 2015  AMMONIA 42*   Coagulation Profile: No results for input(s): "INR", "PROTIME" in the last 168 hours. Cardiac Enzymes: No results for input(s): "CKTOTAL", "CKMB", "CKMBINDEX", "TROPONINI" in the last 168 hours. BNP (last 3 results) No results for input(s): "PROBNP" in the last 8760 hours. HbA1C: No results for input(s): "HGBA1C" in the last 72 hours. CBG: No results for input(s): "GLUCAP" in the last 168 hours. Lipid Profile: No results for input(s): "CHOL", "HDL", "LDLCALC", "TRIG", "CHOLHDL", "LDLDIRECT" in the last 72 hours. Thyroid  Function Tests: No results for input(s): "TSH", "T4TOTAL", "FREET4", "T3FREE", "THYROIDAB" in the last 72 hours. Anemia Panel: No results for input(s): "VITAMINB12", "FOLATE", "FERRITIN", "TIBC", "IRON", "RETICCTPCT" in the last 72 hours. Urine analysis:    Component Value Date/Time   COLORURINE AMBER (A) 02/25/2024 2206   APPEARANCEUR HAZY (A) 02/25/2024 2206   LABSPEC 1.015 02/25/2024 2206   PHURINE 6.0 02/25/2024 2206   GLUCOSEU NEGATIVE 02/25/2024 2206   HGBUR LARGE (A) 02/25/2024 2206   BILIRUBINUR NEGATIVE 02/25/2024 2206   KETONESUR 5 (A) 02/25/2024 2206   PROTEINUR 100 (A) 02/25/2024 2206   NITRITE NEGATIVE 02/25/2024 2206   LEUKOCYTESUR NEGATIVE 02/25/2024 2206   Sepsis Labs: @LABRCNTIP (procalcitonin:4,lacticidven:4) )No results  found for this or any previous visit (from the past 240 hours).   Radiological Exams on Admission: CT HEAD WO CONTRAST ( ) Result Date: 02/25/2024 EXAM: CT HEAD WITHOUT 02/25/2024 09:33:24 PM TECHNIQUE: CT of the head was performed without the administration of intravenous contrast. Automated exposure control, iterative reconstruction, and/or weight based adjustment of the mA/kV was utilized to reduce the radiation dose to as low as reasonably achievable. COMPARISON: 01/04/2024 CLINICAL HISTORY: Mental status change, unknown cause. Chief complaints: Weakness. FINDINGS: BRAIN AND VENTRICLES: There is no acute intracranial hemorrhage, mass effect or midline shift. No abnormal extra-axial fluid collection. The gray-white differentiation is maintained without evidence of an acute infarct. There is no evidence of hydrocephalus. Mild scattered periventricular and subcortical low density, likely reflecting small vessel ischemic changes. Intracranial arteriosclerosis. ORBITS: The visualized portion of the orbits demonstrate no acute abnormality. SINUSES: Opacification of the left frontal sinus. Partial opacification of the bilateral ethmoid and maxillary sinuses. SOFT TISSUES AND SKULL: No acute abnormality of the visualized skull or soft tissues. IMPRESSION: 1. No acute intracranial abnormality. 2. Atrophy with mild small vessel ischemic changes. Electronically signed by: Zadie Herter MD 02/25/2024 09:55 PM EDT RP Workstation: [id number]   DG Chest Port 1 View Result Date: 02/25/2024 EXAM: 1 VIEW(S) XRAY OF THE CHEST 02/25/2024 08:04:56 PM COMPARISON: CT chest 02/02/2024 CLINICAL HISTORY: Altered. FINDINGS: LUNGS AND PLEURA: Mild left basilar scarring/atelectasis. No consolidation. No pulmonary edema. No pleural effusion. No pneumothorax. HEART AND MEDIASTINUM: Left subclavian pacemaker. Thoracic aortic atherosclerosis. No acute abnormality of the cardiac and mediastinal silhouettes. BONES AND SOFT TISSUES: No  acute osseous abnormality. IMPRESSION: 1. No acute cardiopulmonary pathology. Electronically signed by: Zadie Herter MD 02/25/2024 09:53 PM  EDT RP Workstation: [id number]    EKG: Independently reviewed. Sinus or ectopic atrial rhythm, RBBB, LVH with repolarization abnormality.   Assessment/Plan   1. 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 Hx of CVA  - Lipitor, Xarelto    4. Type II DM  - A1c was 8.4% in 2023 - Hold metformin , check CBGs, and use low-intensity SSI for now    5. Hx of DVT  - Xarelto     6. OSA  - CPAP while sleeping    DVT prophylaxis: Xarelto   Code Status: Full  Level of Care: Level of care: Telemetry Medical Family Communication: Wife at bedside  Disposition Plan:  Patient is from: home  Anticipated d/c is to: TBD Anticipated d/c date is: 4/27 or 02/27/24  Patient currently: Pending additional workup, PT eval  Consults called: None  Admission status: Observation     Walton Guppy, MD [facility]  02/26/2024, 1:10 AM

## 2024-02-26 NOTE — Care Management (Signed)
 Encounter reported to [facility] via online emergency care reporting form.  Confirmation [phone]

## 2024-02-26 NOTE — ED Notes (Signed)
 ED TO INPATIENT HANDOFF REPORT  ED Nurse Name and Phone #:  Hilma Lucks" Nolon Baxter [phone]  S Name/Age/Gender Jordan [name] 83 y.o. male Room/Bed: 038C/038C  Code Status   Code Status: Full Code  Home/SNF/Other Home Patient oriented to: self and situation Is this baseline? No   Triage Complete: Triage complete  Chief Complaint Acute encephalopathy [G93.40]  Triage Note Pt BIB [facility] from home d/t gen weakness for weeks and possible UTI.  Pt has hx of Parkinson's and has trouble getting out words.     Allergies Allergies  Allergen Reactions   Lisinopril      Unknown reaction   Amantadines Other (See Comments)    Mental changes   Clonazepam  Other (See Comments)    Mental changes   Zoloft  [Sertraline ] Other (See Comments)    Mental changes    Level of Care/Admitting Diagnosis ED Disposition     ED Disposition  Admit   Condition  --   Comment  Hospital Area: [facility] [[id number]]  Level of Care: Telemetry Medical [104]  May place patient in observation at [facility] or [facility] if equivalent level of care is available:: Yes  Covid Evaluation: Asymptomatic - no recent exposure (last 10 days) testing not required  Diagnosis: Acute encephalopathy [098119]  Admitting Physician: Walton Guppy [[id number]]  Attending Physician: Walton Guppy [[id number]]          B Medical/Surgery History Past Medical History:  Diagnosis Date   Arthritis    R shoulder, bone spur   Arthritis    "hands" (03/25/2016)   Chronic diastolic heart failure (HCC)    Chronic lower back pain    Coronary heart disease    Dr Ollie Bhat   Diverticulitis 12/2021   DVT (deep venous thrombosis) (HCC) 05/2022   left leg   H/O cardiovascular stress test    perhaps last one was 2009   Heart murmur    12/29/11 echo: mild MR, no AS, trivial TR   History of gout    Hyperlipidemia    Hypertension    saw Dr. Pauletta Boroughs last earl;y- 2014, cardiac cath. last done  ?2009, blocks seen didn't require any intervention at that point.   Narcolepsy    [name] 04-11-97; Mean Latency 1.8min, SOREM 2   OSA on CPAP    NPSG 12-13-98 AHI 22.7   PONV (postoperative nausea and vomiting)    Presence of permanent cardiac pacemaker    Shortness of breath    with exertion    Stroke (HCC)    Type II diabetes mellitus (HCC)    Past Surgical History:  Procedure Laterality Date   CARDIAC CATHETERIZATION  2009?   EP IMPLANTABLE DEVICE N/A 03/25/2016   Procedure: Pacemaker Implant - Dual Chamber;  Surgeon: Tammie Fall, MD;  Location: [facility] INVASIVE CV LAB;  Service: Cardiovascular;  Laterality: N/A;   FRACTURE SURGERY     HIP FRACTURE SURGERY     INSERT / REPLACE / REMOVE PACEMAKER  03/24/2016   INTRAMEDULLARY (IM) NAIL INTERTROCHANTERIC Left 02/08/2022   Procedure: INTRAMEDULLARY (IM) NAIL INTERTROCHANTRIC;  Surgeon: Jasmine Mesi, MD;  Location: [facility] OR;  Service: Orthopedics;  Laterality: Left;   IR KYPHO LUMBAR INC FX REDUCE BONE BX UNI/BIL CANNULATION INC/IMAGING  12/29/2021   IR RADIOLOGIST EVAL & MGMT  12/15/2021   IR RADIOLOGY PERIPHERAL GUIDED IV START  07/14/2022   IR US  GUIDE VASC ACCESS RIGHT  07/15/2022   LEFT AND RIGHT HEART CATHETERIZATION WITH CORONARY  ANGIOGRAM N/A 06/05/2014   Procedure: LEFT AND RIGHT HEART CATHETERIZATION WITH CORONARY ANGIOGRAM;  Surgeon: Mickiel Albany, MD;  Location: [facility] CATH LAB;  Service: Cardiovascular;  Laterality: N/A;   NASAL FRACTURE SURGERY     SHOULDER ARTHROSCOPY WITH ROTATOR CUFF REPAIR AND SUBACROMIAL DECOMPRESSION Right 08/16/2013   Procedure: SHOULDER ARTHROSCOPY WITH ROTATOR CUFF REPAIR AND SUBACROMIAL DECOMPRESSION;  Surgeon: Derald Flattery, MD;  Location: [facility] OR;  Service: Orthopedics;  Laterality: Right;  Right shoulder arthroscopy rotator cuff repair, subacromial decompression.   TONSILLECTOMY       A IV Location/Drains/Wounds Patient Lines/Drains/Airways Status     Active Line/Drains/Airways      Name Placement date Placement time Site Days   Peripheral IV 02/25/24 20 G 1" Posterior;Right Hand 02/25/24  1952  Hand  1   Pressure Injury 01/06/24 Coccyx Posterior Stage 1 -  Intact skin with non-blanchable redness of a localized area usually over a bony prominence. 01/06/24  1130  -- 51            Intake/Output Last 24 hours  Intake/Output Summary (Last 24 hours) at 02/26/2024 0314 Last data filed at 02/26/2024 1610 Gross per 24 hour  Intake 550 ml  Output --  Net 550 ml    Labs/Imaging Results for orders placed or performed during the hospital encounter of 02/25/24 (from the past 48 hours)  Acetaminophen  level     Status: Abnormal   Collection Time: 02/25/24  8:15 PM  Result Value Ref Range   Acetaminophen  (Tylenol ), Serum <10 (L) 10 - 30 ug/mL    Comment: (NOTE) Therapeutic concentrations vary significantly. A range of 10-30 ug/mL  may be an effective concentration for many patients. However, some  are best treated at concentrations outside of this range. Acetaminophen  concentrations >150 ug/mL at 4 hours after ingestion  and >50 ug/mL at 12 hours after ingestion are often associated with  toxic reactions.  Performed at [facility], [address], [place], [place] [address]   Ammonia     Status: Abnormal   Collection Time: 02/25/24  8:15 PM  Result Value Ref Range   Ammonia 42 (H) 9 - 35 umol/L    Comment: HEMOLYSIS AT THIS LEVEL MAY AFFECT RESULT Performed at [facility], [address] [place], [place] [address]   Comprehensive metabolic panel with GFR     Status: Abnormal   Collection Time: 02/25/24  8:15 PM  Result Value Ref Range   Sodium 137 135 - 145 mmol/L   Potassium 4.4 3.5 - 5.1 mmol/L   Chloride 104 98 - 111 mmol/L   CO2 23 22 - 32 mmol/L   Glucose, Bld 132 (H) 70 - 99 mg/dL    Comment: Glucose reference range applies only to samples taken after fasting for at least 8 hours.   BUN 17 8 - 23 mg/dL   Creatinine, Ser 1.91 0.61 -  1.24 mg/dL   Calcium  9.3 8.9 - 10.3 mg/dL   Total Protein 6.7 6.5 - 8.1 g/dL   Albumin 3.3 (L) 3.5 - 5.0 g/dL   AST 21 15 - 41 U/L   ALT 11 0 - 44 U/L   Alkaline Phosphatase 48 38 - 126 U/L   Total Bilirubin 0.3 0.0 - 1.2 mg/dL   GFR, Estimated >47 >82 mL/min    Comment: (NOTE) Calculated using the CKD-EPI Creatinine Equation (2021)    Anion gap 10 5 - 15    Comment: Performed at [facility]  Lab, [address] [place], [place] [address]  Ethanol     Status: None   Collection Time: 02/25/24  8:15 PM  Result Value Ref Range   Alcohol , Ethyl (B) <15 <15 mg/dL    Comment: Please note change in reference range. (NOTE) For medical purposes only. Performed at [facility], [address], [place], [place] [address]   Magnesium      Status: Abnormal   Collection Time: 02/25/24  8:15 PM  Result Value Ref Range   Magnesium  1.6 (L) 1.7 - 2.4 mg/dL    Comment: Performed at [facility], [address] [place], [place] [address]  Salicylate level     Status: Abnormal   Collection Time: 02/25/24  8:15 PM  Result Value Ref Range   Salicylate Lvl <7.0 (L) 7.0 - 30.0 mg/dL    Comment: Performed at [facility], [address] [place], [place] [address]  CBC with Differential/Platelet     Status: Abnormal   Collection Time: 02/25/24  9:44 PM  Result Value Ref Range   WBC 9.5 4.0 - 10.5 K/uL   RBC 4.30 4.22 - 5.81 MIL/uL   Hemoglobin 11.2 (L) 13.0 - 17.0 g/dL   HCT 95.2 (L) 84.1 - 32.4 %   MCV 84.4 80.0 - 100.0 fL   MCH 26.0 26.0 - 34.0 pg   MCHC 30.9 30.0 - 36.0 g/dL   RDW 40.1 (H) 02.7 - 25.3 %   Platelets 264 150 - 400 K/uL   nRBC 0.0 0.0 - 0.2 %   Neutrophils Relative % 62 %   Neutro Abs 5.9 1.7 - 7.7 K/uL   Lymphocytes Relative 24 %   Lymphs Abs 2.3 0.7 - 4.0 K/uL   Monocytes Relative 10 %   Monocytes Absolute 0.9 0.1 - 1.0 K/uL   Eosinophils Relative 3 %   Eosinophils Absolute 0.3 0.0 - 0.5 K/uL   Basophils Relative 1 %   Basophils Absolute 0.1 0.0 -  0.1 K/uL   Immature Granulocytes 0 %   Abs Immature Granulocytes 0.03 0.00 - 0.07 K/uL    Comment: Performed at [facility], [address], [place], [place] [address]  Urinalysis, w/ Reflex to Culture (Infection Suspected) -Urine, Clean Catch     Status: Abnormal   Collection Time: 02/25/24 10:06 PM  Result Value Ref Range   Specimen Source URINE, CLEAN CATCH    Color, Urine AMBER (A) YELLOW    Comment: BIOCHEMICALS MAY BE AFFECTED BY COLOR   APPearance HAZY (A) CLEAR   Specific Gravity, Urine 1.015 1.005 - 1.030   pH 6.0 5.0 - 8.0   Glucose, UA NEGATIVE NEGATIVE mg/dL   Hgb urine dipstick LARGE (A) NEGATIVE   Bilirubin Urine NEGATIVE NEGATIVE   Ketones, ur 5 (A) NEGATIVE mg/dL   Protein, ur 347 (A) NEGATIVE mg/dL   Nitrite NEGATIVE NEGATIVE   Leukocytes,Ua NEGATIVE NEGATIVE   RBC / HPF >50 0 - 5 RBC/hpf   WBC, UA 0-5 0 - 5 WBC/hpf    Comment:        Reflex urine culture not performed if WBC <=10, OR if Squamous epithelial cells >5. If Squamous epithelial cells >5 suggest recollection.    Bacteria, UA FEW (A) NONE SEEN   Squamous Epithelial / HPF 0-5 0 - 5 /HPF   Mucus PRESENT     Comment: Performed at [facility], [address], [place], [place] [address]  CBG monitoring, ED  Status: Abnormal   Collection Time: 02/26/24  1:35 AM  Result Value Ref Range   Glucose-Capillary 114 (H) 70 - 99 mg/dL    Comment: Glucose reference range applies only to samples taken after fasting for at least 8 hours.   CT HEAD WO CONTRAST ( ) Result Date: 02/25/2024 EXAM: CT HEAD WITHOUT 02/25/2024 09:33:24 PM TECHNIQUE: CT of the head was performed without the administration of intravenous contrast. Automated exposure control, iterative reconstruction, and/or weight based adjustment of the mA/kV was utilized to reduce the radiation dose to as low as reasonably achievable. COMPARISON: 01/04/2024 CLINICAL HISTORY: Mental status change, unknown cause. Chief complaints: Weakness.  FINDINGS: BRAIN AND VENTRICLES: There is no acute intracranial hemorrhage, mass effect or midline shift. No abnormal extra-axial fluid collection. The gray-white differentiation is maintained without evidence of an acute infarct. There is no evidence of hydrocephalus. Mild scattered periventricular and subcortical low density, likely reflecting small vessel ischemic changes. Intracranial arteriosclerosis. ORBITS: The visualized portion of the orbits demonstrate no acute abnormality. SINUSES: Opacification of the left frontal sinus. Partial opacification of the bilateral ethmoid and maxillary sinuses. SOFT TISSUES AND SKULL: No acute abnormality of the visualized skull or soft tissues. IMPRESSION: 1. No acute intracranial abnormality. 2. Atrophy with mild small vessel ischemic changes. Electronically signed by: Zadie Herter MD 02/25/2024 09:55 PM EDT RP Workstation: [id number]   DG Chest Port 1 View Result Date: 02/25/2024 EXAM: 1 VIEW(S) XRAY OF THE CHEST 02/25/2024 08:04:56 PM COMPARISON: CT chest 02/02/2024 CLINICAL HISTORY: Altered. FINDINGS: LUNGS AND PLEURA: Mild left basilar scarring/atelectasis. No consolidation. No pulmonary edema. No pleural effusion. No pneumothorax. HEART AND MEDIASTINUM: Left subclavian pacemaker. Thoracic aortic atherosclerosis. No acute abnormality of the cardiac and mediastinal silhouettes. BONES AND SOFT TISSUES: No acute osseous abnormality. IMPRESSION: 1. No acute cardiopulmonary pathology. Electronically signed by: Zadie Herter MD 02/25/2024 09:53 PM EDT RP Workstation: [id number]    Pending Labs Unresulted Labs (From admission, onward)     Start     Ordered   02/26/24 0500  Hemoglobin A1c  Tomorrow morning,   R       Comments: To assess prior glycemic control    02/26/24 0109   02/26/24 0500  Basic metabolic panel  Daily,   R      02/26/24 0110   02/26/24 0500  Magnesium   Tomorrow morning,   R        02/26/24 0110   02/26/24 0500  Phosphorus  Tomorrow  morning,   R        02/26/24 0110   02/26/24 0500  CBC  Daily,   R      02/26/24 0110   02/26/24 0500  TSH  Tomorrow morning,   R        02/26/24 0110   02/25/24 1959  CBC with Differential/Platelet  Once,   STAT        02/25/24 1959            Vitals/Pain Today's Vitals   02/26/24 0015 02/26/24 0022 02/26/24 0143 02/26/24 0245  BP: (!) 154/128     Pulse: 72     Resp: (!) 21     Temp:  98.5 F (36.9 C)    TempSrc:      SpO2: 93%     Weight:      Height:      PainSc:   0-No pain 4     Isolation Precautions No active isolations  Medications Medications  atorvastatin  (LIPITOR) tablet 20 mg (has no administration  in time range)  modafinil  (PROVIGIL ) tablet 200 mg (has no administration in time range)  QUEtiapine  (SEROQUEL ) tablet 12.5 mg (12.5 mg Oral Given 02/26/24 0136)  tamsulosin (FLOMAX) capsule 0.4 mg (has no administration in time range)  rivaroxaban  (XARELTO ) tablet 20 mg (has no administration in time range)  melatonin tablet 10 mg (10 mg Oral Given 02/26/24 0136)  carbidopa -levodopa  (SINEMET  CR) 50-200 MG per tablet controlled release 1 tablet (has no administration in time range)  insulin  aspart (novoLOG ) injection 0-6 Units (has no administration in time range)  insulin  aspart (novoLOG ) injection 0-5 Units (0 Units Subcutaneous Not Given 02/26/24 0140)  sodium chloride  flush (NS) 0.9 % injection 3 mL (has no administration in time range)  acetaminophen  (TYLENOL ) tablet 650 mg (has no administration in time range)    Or  acetaminophen  (TYLENOL ) suppository 650 mg (has no administration in time range)  polyethylene glycol (MIRALAX  / GLYCOLAX ) packet 17 g (has no administration in time range)  bisacodyl  (DULCOLAX) EC tablet 5 mg (has no administration in time range)  ondansetron  (ZOFRAN ) tablet 4 mg (has no administration in time range)    Or  ondansetron  (ZOFRAN ) injection 4 mg (has no administration in time range)  0.9 %  sodium chloride  infusion (1,000 mLs  Intravenous New Bag/Given 02/26/24 0142)  sodium chloride  0.9 % bolus 500 mL (0 mLs Intravenous Stopped 02/26/24 0011)  magnesium  sulfate IVPB 2 g 50 mL (0 g Intravenous Stopped 02/26/24 0313)    Mobility non-ambulatory     Focused Assessments Neuro Assessment Handoff:  Swallow screen pass?  Swallow screen not completed         Neuro Assessment:   Neuro Checks:      Has TPA been given? No If patient is a Neuro Trauma and patient is going to OR before floor call report to 4N Charge nurse: [phone] or [phone]   R Recommendations: See Admitting Provider Note  Report given to:   Additional Notes:  Pt has hx of parkinson's with worsen symptoms.  Wife will come back in the morning

## 2024-02-26 NOTE — Evaluation (Signed)
 Physical Therapy Evaluation Patient Details Name: Jordan [name] MRN: [id number] DOB: 31-Mar-1941 Today's Date: 02/26/2024  History of Present Illness  Pt is 83 yo presenting to [facility] ED on 02/25/24 with increased fatigue, confusion, and loss of appetite.  PMHx: Parkinsons, HLD, OSA, DM, HTN, chronic diastolic HF, CVA, frequent falls, L2 compression fx, L hip fx  Clinical Impression  Pt admitted with above diagnosis. Per chart, pt home with family. On eval no family present and pt with speech difficult to understand and when understood conversation was nonsensical. Pt also lethargic and pulling on gown, lines, tubes. Pt needed mod A to come to EOB and mod A to stand to RW. Pt was unable to step feet once in standing, even with manual facilitation given for wt shifting. Pt could not maintain standing more than 30 secs before needing to sit. Falling back asleep once returned to supine with max A. Per chart, pt had been ambulating in home with RW. Given significant change in function, patient will benefit from continued inpatient follow up therapy, <3 hours/day. Pt currently with functional limitations due to the deficits listed below (see PT Problem List). Pt will benefit from acute skilled PT to increase their independence and safety with mobility to allow discharge.           If plan is discharge home, recommend the following: Two people to help with walking and/or transfers;A lot of help with bathing/dressing/bathroom;Assistance with feeding;Assistance with cooking/housework;Assist for transportation;Supervision due to cognitive status   Can travel by private vehicle   No    Equipment Recommendations None recommended by PT  Recommendations for Other Services       Functional Status Assessment Patient has had a recent decline in their functional status and demonstrates the ability to make significant improvements in function in a reasonable and predictable amount of time.     Precautions /  Restrictions Precautions Precautions: Fall Recall of Precautions/Restrictions: Impaired Restrictions Weight Bearing Restrictions Per Provider Order: No      Mobility  Bed Mobility Overal bed mobility: Needs Assistance Bed Mobility: Sit to Supine, Supine to Sit     Supine to sit: Mod assist Sit to supine: Max assist   General bed mobility comments: pt needed tactile cues to initiate coming to EOB, had difficulty with sequencing and putting feet back up on bed at one point during transfer. Mod A for LE's off EOB and transfer of wt from L shoulder to upright. Max A needed for return to supine and lifting LE's into bed    Transfers Overall transfer level: Needs assistance Equipment used: Rolling walker (2 wheels) Transfers: Sit to/from Stand Sit to Stand: Mod assist, From elevated surface           General transfer comment: mod A for power up. Performed 3x and then very fatigued. Could maintain standing only 10-30 secs before sitting back down despite cues to remain standing    Ambulation/Gait               General Gait Details: attempted to have pt take steps along bedside but pt unable to lift feet, standing with wide BOS. Manual facilitation given for wt shifting but pt still unable to lift either foot  Stairs            Wheelchair Mobility     Tilt Bed    Modified Rankin (Stroke Patients Only)       Balance Overall balance assessment: History of Falls, Needs assistance Sitting-balance support: Feet  supported, Bilateral upper extremity supported Sitting balance-Leahy Scale: Fair Sitting balance - Comments: maintained static sitting EOB with supervision   Standing balance support: Bilateral upper extremity supported, During functional activity, Reliant on assistive device for balance Standing balance-Leahy Scale: Poor Standing balance comment: UE support and min to mod A needed to maintain standing, posterior lean noted with LE's braced against bed                              Pertinent Vitals/Pain Pain Assessment Pain Assessment: Faces Faces Pain Scale: No hurt    Home Living Family/patient expects to be discharged to:: Private residence Living Arrangements: Spouse/significant other;Children Available Help at Discharge: Family;Available 24 hours/day Type of Home: Other(Comment) (condo) Home Access: Level entry;Elevator       Home Layout: One level Home Equipment: Agricultural consultant (2 wheels);Wheelchair - Manufacturing systems engineer;Other (comment);Grab bars - tub/shower;Grab bars - toilet;Lift chair Additional Comments: info from last admission, pt unable to give any history or PLOF    Prior Function Prior Level of Function : Needs assist       Physical Assist : ADLs (physical);Mobility (physical) Mobility (physical): Transfers;Gait ADLs (physical): Bathing;Dressing;IADLs;Toileting Mobility Comments: RW for home, w/c for community. Has had falls ADLs Comments: assisted for washing back, bottom, feet and dressing LB, wife manages medications     Extremity/Trunk Assessment   Upper Extremity Assessment Upper Extremity Assessment: Generalized weakness;Defer to OT evaluation    Lower Extremity Assessment Lower Extremity Assessment: Generalized weakness    Cervical / Trunk Assessment Cervical / Trunk Assessment: Kyphotic  Communication   Communication Communication: Impaired Factors Affecting Communication: Reduced clarity of speech;Difficulty expressing self    Cognition Arousal: Lethargic Behavior During Therapy: Flat affect   PT - Cognitive impairments: No family/caregiver present to determine baseline                       PT - Cognition Comments: confused, difficult to understand speech and what was understood was nonsensical. Pulling at gown/ lines/ tubes. Follows basic commands 50% of time. Following commands: Impaired Following commands impaired: Follows one step commands inconsistently,  Follows one step commands with increased time     Cueing Cueing Techniques: Verbal cues, Gestural cues, Tactile cues, Visual cues     General Comments General comments (skin integrity, edema, etc.): BP 120/68, SPO2 94% on RA, HR 73 bpm    Exercises     Assessment/Plan    PT Assessment Patient needs continued PT services  PT Problem List Decreased strength;Decreased activity tolerance;Decreased balance;Decreased mobility;Decreased coordination;Decreased cognition;Decreased knowledge of use of DME;Decreased safety awareness;Decreased knowledge of precautions       PT Treatment Interventions DME instruction;Gait training;Therapeutic activities;Functional mobility training;Therapeutic exercise;Balance training;Neuromuscular re-education;Cognitive remediation;Patient/family education    PT Goals (Current goals can be found in the Care Plan section)  Acute Rehab PT Goals Patient Stated Goal: none stated PT Goal Formulation: Patient unable to participate in goal setting Time For Goal Achievement: 03/11/24 Potential to Achieve Goals: Fair    Frequency Min 1X/week     Co-evaluation               AM-PAC PT "6 Clicks" Mobility  Outcome Measure Help needed turning from your back to your side while in a flat bed without using bedrails?: A Lot Help needed moving from lying on your back to sitting on the side of a flat bed without using bedrails?: A Lot Help needed moving  to and from a bed to a chair (including a wheelchair)?: A Lot Help needed standing up from a chair using your arms (e.g., wheelchair or bedside chair)?: A Lot Help needed to walk in hospital room?: Total Help needed climbing 3-5 steps with a railing? : Total 6 Click Score: 10    End of Session Equipment Utilized During Treatment: Gait belt Activity Tolerance: Patient limited by lethargy Patient left: in bed;with call bell/phone within reach;with bed alarm set Nurse Communication: Mobility status PT Visit  Diagnosis: History of falling (Z91.81);Muscle weakness (generalized) (M62.81);Difficulty in walking, not elsewhere classified (R26.2)    Time: 1610-9604 PT Time Calculation (min) (ACUTE ONLY): 28 min   Charges:   PT Evaluation $PT Eval Moderate Complexity: 1 Mod PT Treatments $Therapeutic Activity: 8-22 mins PT General Charges $$ ACUTE PT VISIT: 1 Visit         Amey Ka, PT  Acute Rehab Services Secure chat preferred Office [phone]   Deloris Fetters Aki Burdin 02/26/2024, 3:47 PM

## 2024-02-27 ENCOUNTER — Encounter (HOSPITAL_COMMUNITY): Payer: Self-pay | Admitting: Family Medicine

## 2024-02-27 DIAGNOSIS — D509 Iron deficiency anemia, unspecified: Secondary | ICD-10-CM | POA: Diagnosis present

## 2024-02-27 DIAGNOSIS — R410 Disorientation, unspecified: Secondary | ICD-10-CM | POA: Diagnosis not present

## 2024-02-27 DIAGNOSIS — I110 Hypertensive heart disease with heart failure: Secondary | ICD-10-CM | POA: Diagnosis present

## 2024-02-27 DIAGNOSIS — Z7984 Long term (current) use of oral hypoglycemic drugs: Secondary | ICD-10-CM | POA: Diagnosis not present

## 2024-02-27 DIAGNOSIS — I69320 Aphasia following cerebral infarction: Secondary | ICD-10-CM | POA: Diagnosis not present

## 2024-02-27 DIAGNOSIS — C7B8 Other secondary neuroendocrine tumors: Secondary | ICD-10-CM | POA: Diagnosis present

## 2024-02-27 DIAGNOSIS — G928 Other toxic encephalopathy: Secondary | ICD-10-CM | POA: Diagnosis present

## 2024-02-27 DIAGNOSIS — R531 Weakness: Secondary | ICD-10-CM | POA: Diagnosis not present

## 2024-02-27 DIAGNOSIS — F05 Delirium due to known physiological condition: Secondary | ICD-10-CM | POA: Diagnosis present

## 2024-02-27 DIAGNOSIS — G4733 Obstructive sleep apnea (adult) (pediatric): Secondary | ICD-10-CM | POA: Diagnosis present

## 2024-02-27 DIAGNOSIS — G20A1 Parkinson's disease without dyskinesia, without mention of fluctuations: Secondary | ICD-10-CM | POA: Diagnosis present

## 2024-02-27 DIAGNOSIS — N202 Calculus of kidney with calculus of ureter: Secondary | ICD-10-CM | POA: Diagnosis present

## 2024-02-27 DIAGNOSIS — Z6831 Body mass index (BMI) 31.0-31.9, adult: Secondary | ICD-10-CM | POA: Diagnosis not present

## 2024-02-27 DIAGNOSIS — Z87891 Personal history of nicotine dependence: Secondary | ICD-10-CM | POA: Diagnosis not present

## 2024-02-27 DIAGNOSIS — E1169 Type 2 diabetes mellitus with other specified complication: Secondary | ICD-10-CM | POA: Diagnosis not present

## 2024-02-27 DIAGNOSIS — Z8673 Personal history of transient ischemic attack (TIA), and cerebral infarction without residual deficits: Secondary | ICD-10-CM | POA: Diagnosis not present

## 2024-02-27 DIAGNOSIS — Z950 Presence of cardiac pacemaker: Secondary | ICD-10-CM | POA: Diagnosis not present

## 2024-02-27 DIAGNOSIS — G9341 Metabolic encephalopathy: Secondary | ICD-10-CM | POA: Diagnosis not present

## 2024-02-27 DIAGNOSIS — I2510 Atherosclerotic heart disease of native coronary artery without angina pectoris: Secondary | ICD-10-CM | POA: Diagnosis present

## 2024-02-27 DIAGNOSIS — N400 Enlarged prostate without lower urinary tract symptoms: Secondary | ICD-10-CM | POA: Diagnosis present

## 2024-02-27 DIAGNOSIS — Z86718 Personal history of other venous thrombosis and embolism: Secondary | ICD-10-CM | POA: Diagnosis not present

## 2024-02-27 DIAGNOSIS — R310 Gross hematuria: Secondary | ICD-10-CM | POA: Diagnosis not present

## 2024-02-27 DIAGNOSIS — G9340 Encephalopathy, unspecified: Secondary | ICD-10-CM | POA: Diagnosis not present

## 2024-02-27 DIAGNOSIS — C7A8 Other malignant neuroendocrine tumors: Secondary | ICD-10-CM | POA: Diagnosis present

## 2024-02-27 DIAGNOSIS — E785 Hyperlipidemia, unspecified: Secondary | ICD-10-CM | POA: Diagnosis not present

## 2024-02-27 DIAGNOSIS — Z7401 Bed confinement status: Secondary | ICD-10-CM | POA: Diagnosis not present

## 2024-02-27 DIAGNOSIS — L89151 Pressure ulcer of sacral region, stage 1: Secondary | ICD-10-CM | POA: Diagnosis present

## 2024-02-27 DIAGNOSIS — N200 Calculus of kidney: Secondary | ICD-10-CM | POA: Diagnosis not present

## 2024-02-27 DIAGNOSIS — G459 Transient cerebral ischemic attack, unspecified: Secondary | ICD-10-CM | POA: Diagnosis not present

## 2024-02-27 DIAGNOSIS — R627 Adult failure to thrive: Secondary | ICD-10-CM | POA: Diagnosis present

## 2024-02-27 DIAGNOSIS — Z79899 Other long term (current) drug therapy: Secondary | ICD-10-CM | POA: Diagnosis not present

## 2024-02-27 DIAGNOSIS — I5032 Chronic diastolic (congestive) heart failure: Secondary | ICD-10-CM | POA: Diagnosis present

## 2024-02-27 LAB — RETICULOCYTES
Immature Retic Fract: 17.1 % — ABNORMAL HIGH (ref 2.3–15.9)
RBC.: 4.32 MIL/uL (ref 4.22–5.81)
Retic Count, Absolute: 94.2 10*3/uL (ref 19.0–186.0)
Retic Ct Pct: 2.2 % (ref 0.4–3.1)

## 2024-02-27 LAB — BASIC METABOLIC PANEL WITH GFR
Anion gap: 11 (ref 5–15)
BUN: 18 mg/dL (ref 8–23)
CO2: 23 mmol/L (ref 22–32)
Calcium: 9.4 mg/dL (ref 8.9–10.3)
Chloride: 103 mmol/L (ref 98–111)
Creatinine, Ser: 1.08 mg/dL (ref 0.61–1.24)
GFR, Estimated: >60 mL/min (ref >60)
Glucose, Bld: 121 mg/dL — ABNORMAL HIGH (ref 70–99)
Potassium: 4 mmol/L (ref 3.5–5.1)
Sodium: 137 mmol/L (ref 135–145)

## 2024-02-27 LAB — IRON AND TIBC
Iron: 40 ug/dL — ABNORMAL LOW (ref 45–182)
Saturation Ratios: 10 % — ABNORMAL LOW (ref 17.9–39.5)
TIBC: 393 ug/dL (ref 250–450)
UIBC: 353 ug/dL

## 2024-02-27 LAB — GLUCOSE, CAPILLARY
Glucose-Capillary: 133 mg/dL — ABNORMAL HIGH (ref 70–99)
Glucose-Capillary: 177 mg/dL — ABNORMAL HIGH (ref 70–99)
Glucose-Capillary: 192 mg/dL — ABNORMAL HIGH (ref 70–99)
Glucose-Capillary: 173 mg/dL — ABNORMAL HIGH (ref 70–99)

## 2024-02-27 LAB — CBC
HCT: 35.5 % — ABNORMAL LOW (ref 39.0–52.0)
Hemoglobin: 11.4 g/dL — ABNORMAL LOW (ref 13.0–17.0)
MCH: 26.8 pg (ref 26.0–34.0)
MCHC: 32.1 g/dL (ref 30.0–36.0)
MCV: 83.3 fL (ref 80.0–100.0)
Platelets: 282 10*3/uL (ref 150–400)
RBC: 4.26 MIL/uL (ref 4.22–5.81)
RDW: 18.8 % — ABNORMAL HIGH (ref 11.5–15.5)
WBC: 10.2 10*3/uL (ref 4.0–10.5)
nRBC: 0 % (ref 0.0–0.2)

## 2024-02-27 LAB — VITAMIN B12: Vitamin B-12: 592 pg/mL (ref 180–914)

## 2024-02-27 LAB — FOLATE: Folate: 39.5 ng/mL (ref >5.9)

## 2024-02-27 LAB — FERRITIN: Ferritin: 20 ng/mL — ABNORMAL LOW (ref 24–336)

## 2024-02-27 MED ORDER — IRON SUCROSE 200 MG IVPB - SIMPLE MED
200.0000 mg | Freq: Once | Status: AC
  Administered 2024-02-27: 200 mg via INTRAVENOUS
  Filled 2024-02-27: qty 200

## 2024-02-27 MED ORDER — IRON SUCROSE 400 MG IVPB - SIMPLE MED
400.0000 mg | Status: DC
  Filled 2024-02-27: qty 270

## 2024-02-27 MED ORDER — IRON SUCROSE 200 MG IVPB - SIMPLE MED
200.0000 mg | Status: DC
Start: 2024-02-28 — End: 2024-02-27

## 2024-02-27 MED ORDER — QUETIAPINE FUMARATE 25 MG PO TABS
12.5000 mg | ORAL_TABLET | Freq: Every evening | ORAL | Status: DC | PRN
  Administered 2024-02-27: 12.5 mg via ORAL
  Filled 2024-02-27: qty 1

## 2024-02-27 NOTE — TOC Initial Note (Signed)
 Transition of Care [facility]) - Initial/Assessment Note    Patient Details  Name: Jordan [name] MRN: [id number] Date of Birth: [birth year]  Transition of Care [facility]) CM/SW Contact:    Jannice Mends, LCSW Phone Number: 02/27/2024, 4:15 PM  Clinical Narrative:                 CSW received consult for possible SNF placement at time of discharge. CSW spoke with patient's spouse. Patient's spouse reported that patient's spouse is currently unable to care for patient at their home given patient's current physical needs and fall risk. Patient's spouse expressed understanding of PT recommendation but would like for CSW to check with [facility] since patient went there last month. Otherwise she is agreeable to SNF placement at time of discharge, including the [facility] in [place]. Patient is 80% service connected. CSW discussed insurance authorization process and will provide Medicare SNF ratings list. CSW will send out referrals for review and provide bed offers as available.    Skilled Nursing Rehab Facilities-   [email or web address]   Ratings out of 5 stars (5 the highest)   Name Address  Phone # Quality Care Staffing Health Inspection Overall  [facility] [address] [place] [phone] 3 3 4 4   [facility] [address] [place] [phone] 5 1 4 4   [facility] [address], [facility] [phone] [facility] [address], [place] [phone] 5 2 4 5   [facility]  [address], [place] [phone] 4 3 5 5   [facility] [address], [facility] [phone] 4 2 2 2   [facility] [address], [place] [phone] 5 1 2 2   [facility] [address] [place] [phone] 2 1 3 2   [address] ([place]) [address] [place] [phone] 2 3 3 3   [facility] [address] [place], [place] [phone] 3 3 2 2   [facility]  ([place]) [address], [place] [phone] 3 1 1 1   [facility] [address] [place] [phone] 2 3 4 4   [facility] [address], [place] [phone] 4 4 3 3   [facility] ([place]) [address], [place] [phone] 1 2 4 3           [facility] [address] [place] [phone] 2 1 4 3   [facility] [address] [place] [phone] 4 2 1 1   [facility]  [address] [place], [place] [address] [phone] 2 2 2 2   [facility] [place] [address] [phone] 3 2 4 4   [facility] [address], [place] [phone] 2 1 2 1   [name] (No [facility]) [address], [place] [phone] 5 4 5 5   [facility] [address], [facility] [phone] 3 4 2 2   [facility] [address] [place] [phone] 2 1 1 1   [place] [address] [place] [phone] 4 2 5 5   [facility] [address], [place] [phone] 4 1 1 1   [facility] [address] [place], [place] [phone] 2 2 3 3           [facility]  [phone] 3 1 1 1   [facility] [address], [place]  [phone] 3 3 3 3   [facility] (No [employer]) [address], [place] [phone] 2 2 4 4   [facility]) [address], [place]  [phone] 2 1 1 1   [facility] [address], [place] [phone] 4 3 5 5   [facility] [address], [place] [phone] 1 1 1 1           [facility] [address] [place], [place] [phone] 5 4 5 5   [facility])  [address], [place] [phone] 1 1 2 1   [facility]) [address] [place] [phone]  2 4 4   [facility] [address], [place] [phone] 3 5 5 5   [address] [place], [place] [phone] 4 2 2 2   [facility]) [address] [place]  [phone] 2 1 3 2      Expected Discharge Plan: Skilled Nursing Facility Barriers to Discharge: Continued Medical Work up, English as a second language teacher, SNF Pending bed offer   Patient Goals and CMS Choice Patient states their goals for this hospitalization and ongoing recovery are:: Rehab CMS Medicare.gov Compare Post Acute Care list provided to:: Patient Represenative (must comment) Choice offered to / list presented to : Spouse [place] ownership interest in [facility].provided to:: Spouse    Expected Discharge Plan and Services In-house Referral: Clinical Social Work   Post Acute Care Choice: IP Rehab Living arrangements for the past 2 months: Single Family Home                                      Prior Living Arrangements/Services Living arrangements for the past 2 months: Single Family Home Lives with:: Spouse Patient language and need for interpreter reviewed:: Yes Do you feel safe going back to the place where you live?: Yes      Need for Family Participation in Patient Care: Yes (Comment) Care giver support system in place?: Yes (comment)   Criminal Activity/Legal Involvement Pertinent to Current Situation/Hospitalization: No - Comment as needed  Activities of Daily Living   ADL Screening (condition at time of admission) Independently performs ADLs?: No Does the patient have a NEW difficulty with bathing/dressing/toileting/self-feeding that is expected to last >3 days?: Yes (Initiates electronic notice to provider for possible OT consult) Does the patient have a NEW difficulty with getting in/out of bed, walking, or climbing stairs that is expected to last >3 days?: Yes (Initiates electronic notice to provider for possible PT consult) Does the patient have a NEW difficulty with communication that is expected to last >3 days?: No Is the patient deaf or have difficulty hearing?: No Does the patient have difficulty seeing, even when wearing  glasses/contacts?: Yes Does the patient have difficulty concentrating, remembering, or making decisions?: No  Permission Sought/Granted Permission sought to share information with : Facility Medical sales representative, Family Supports Permission granted to share information with : Yes, Verbal Permission Granted  Share Information with NAME: Loetta Ringer  Permission granted to share info w AGENCY: SNFs/[facility]  Permission granted to share info w Relationship: Spouse  Permission granted to share info w Contact Information: [phone]  Emotional Assessment Appearance:: Appears stated age Attitude/Demeanor/Rapport: Unable to Assess Affect (typically observed): Unable to Assess Orientation: : Oriented to Self, Oriented to Place, Oriented to Situation Alcohol  / Substance Use: Not Applicable Psych Involvement: No (comment)  Admission diagnosis:  Confusion [R41.0] Acute encephalopathy [G93.40] Generalized weakness [R53.1] Acute metabolic encephalopathy [G93.41] Patient Active Problem List   Diagnosis Date Noted   Acute encephalopathy 02/25/2024   OSA  on CPAP 02/25/2024   Symptomatic anemia 02/09/2024   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   History of DVT (deep vein thrombosis) 04/29/2022   Closed intertrochanteric fracture of left hip, initial encounter [facility])    Closed left hip fracture (HCC) secondary to fall at home 02/08/2022   Paroxysmal atrial fibrillation (HCC) 02/08/2022   Debility 01/08/2022   Constipation 01/07/2022   Physical deconditioning 01/07/2022   Acute metabolic encephalopathy 01/04/2022   Hypomagnesemia 01/04/2022   Pancreatitis 12/30/2021   Parkinson's disease (HCC) 12/21/2021   Parkinson disease (HCC) 12/18/2021   Frequent falls 12/18/2021   History of CVA (cerebrovascular accident) 12/18/2021   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   Hallucinations, visual 07/30/2021   Acetabulum fracture, left (HCC) 06/25/2021   Acute hip pain, left  06/24/2021   Fall at home, initial encounter 06/24/2021   Leukocytosis 06/24/2021   Chronic diastolic heart failure (HCC)    Sinusitis, acute maxillary 06/24/2020   AKI (acute kidney injury) (HCC)    Labile blood glucose    Cough    Benign essential HTN    Skin lesion of hand    Hypoalbuminemia due to protein-calorie malnutrition (HCC)    Controlled type 2 diabetes mellitus with hyperglycemia, without long-term current use of insulin  (HCC)    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   Morbid obesity (HCC)    Acute blood loss anemia    Acute thalamic infarction (HCC) 04/15/2020   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   TIA (transient ischemic attack) 02/17/2018   Type 2 diabetes mellitus with hyperlipidemia (HCC) 02/17/2018   Insomnia 02/01/2018   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   Chronotropic incompetence 07/09/2014   Dyspnea on exertion 03/03/2014   HLD (hyperlipidemia) 12/13/2007   Essential hypertension 12/13/2007   Class 1 obesity 12/05/2007   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   Coronary atherosclerosis 12/05/2007   PCP:  Roselind Congo, MD Pharmacy:   [facility] [phone] Jonette Nestle, [place] - [address] AT [facility] [address] [place] [address] Phone: [phone] Fax: [phone]  [facility] - [place], [place] - [address] [place] [address] Phone: [phone] Fax: [phone]     Social Drivers of Health (SDOH) Social History: SDOH Screenings   Food Insecurity: No Food Insecurity (02/26/2024)  Housing: Unknown (02/26/2024)  Transportation Needs: No Transportation Needs (02/26/2024)  Utilities: Not At Risk (02/26/2024)  Depression (PHQ2-9): Low Risk  (04/12/2023)  Social Connections: Moderately Integrated (02/26/2024)  Tobacco Use: Medium Risk (02/25/2024)   SDOH Interventions:     Readmission Risk Interventions      No data to display

## 2024-02-27 NOTE — Consult Note (Addendum)
 NEUROLOGY CONSULT NOTE   Date of service: February 27, 2024 Patient Name: Jordan [name] MRN:  [id number] DOB:  [birth year] Chief Complaint: Weakness, confusion Requesting Provider: Burton Casey, MD  History of Present Illness  Jordan [name] is a 83 y.o. male with hx of Parkinson's, VTE on Xarelto , DM-2, OSA on CPAP, metastatic neuroendocrine tumor on observation-presented with fatigue/confusion/loss of appetite.   Patient was polite and agreeable to interview this morning. Pt complied with all portions of physical exam and questioning as noted below. Pt is frustrated that he does not know what is going on from a big picture standpoint. Pt is a poor historian.  Pt was agreeable to collateral call with spouse as outlined below.   Collateral information obtained Jordan [name], phone number: [phone], patient's spouse) Patient granted permission to speak to contact person without restrictions. 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 High functioning, humorous. 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 - Pt has been extremely tired and weak over the past few weeks.  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 Recommendation was to keep it on observation status.  - Noted low hemoglobin, iron. Outpatient PA ordered IV iron infusion in early April. Difficulties with getting IV iron due to prior authorization issues - Spouse requested referral to [facility] due to dissatisfaction. - Pt's hematuria is chronic as a result of renal calculi - [facility] and they recommended lysis of the stones at some  point.  - Minimal history of substance use. No current or recent concerns.  Known medication allergies: Amantadine , lisinopril , sertraline , clonazepam .  During this conversation, I answered questions pertaining to the patient's current treatment and provided updates and outlined the treatment plan moving forward.  02/27/2024 9:23 AM  LKW: Unclear  NIHSS components Score: Comment  1a Level of Conscious 0[x]  1[]  2[]  3[]      1b LOC Questions 0[x]  1[]  2[]       1c LOC Commands 0[]  1[x]  2[]       2 Best Gaze 0[x]  1[]  2[]       3 Visual 0[x]  1[]  2[]  3[]      4 Facial Palsy 0[x]  1[]  2[]  3[]      5a Motor Arm - left 0[x]  1[]  2[]  3[]  4[]  UN[]    5b Motor Arm - Right 0[x]  1[]  2[]  3[]  4[]  UN[]    6a Motor Leg - Left 0[x]  1[]  2[]  3[]  4[]  UN[]    6b Motor Leg - Right 0[x]  1[]  2[]  3[]  4[]  UN[]    7 Limb Ataxia 0[]  1[x]  2[]  UN[]      8 Sensory 0[x]  1[]  2[]  UN[]      9 Best Language 0[x]  1[]  2[]  3[]      10 Dysarthria 0[x]  1[]  2[]  UN[]      11 Extinct. and Inattention 0[x]  1[]  2[]       TOTAL:   2      ROS   Full history of symptoms   Past History   Past Medical History:  Diagnosis Date   Arthritis    R shoulder, bone spur   Arthritis    "hands" (03/25/2016)   Chronic diastolic heart failure (HCC)    Chronic  lower back pain    Coronary heart disease    Dr Ollie Bhat   Diverticulitis 12/2021   DVT (deep venous thrombosis) (HCC) 05/2022   left leg   H/O cardiovascular stress test    perhaps last one was 2009   Heart murmur    12/29/11 echo: mild MR, no AS, trivial TR   History of gout    Hyperlipidemia    Hypertension    saw Dr. Pauletta Boroughs last earl;y- 2014, cardiac cath. last done ?2009, blocks seen didn't require any intervention at that point.   Narcolepsy    [name] 04-11-97; Mean Latency 1.40min, SOREM 2   OSA on CPAP    NPSG 12-13-98 AHI 22.7   PONV (postoperative nausea and vomiting)    Presence of permanent cardiac pacemaker    Shortness of breath    with exertion    Stroke (HCC)     Type II diabetes mellitus (HCC)     Past Surgical History:  Procedure Laterality Date   CARDIAC CATHETERIZATION  2009?   EP IMPLANTABLE DEVICE N/A 03/25/2016   Procedure: Pacemaker Implant - Dual Chamber;  Surgeon: Tammie Fall, MD;  Location: [facility] INVASIVE CV LAB;  Service: Cardiovascular;  Laterality: N/A;   FRACTURE SURGERY     HIP FRACTURE SURGERY     INSERT / REPLACE / REMOVE PACEMAKER  03/24/2016   INTRAMEDULLARY (IM) NAIL INTERTROCHANTERIC Left 02/08/2022   Procedure: INTRAMEDULLARY (IM) NAIL INTERTROCHANTRIC;  Surgeon: Jasmine Mesi, MD;  Location: [facility] OR;  Service: Orthopedics;  Laterality: Left;   IR KYPHO LUMBAR INC FX REDUCE BONE BX UNI/BIL CANNULATION INC/IMAGING  12/29/2021   IR RADIOLOGIST EVAL & MGMT  12/15/2021   IR RADIOLOGY PERIPHERAL GUIDED IV START  07/14/2022   IR US  GUIDE VASC ACCESS RIGHT  07/15/2022   LEFT AND RIGHT HEART CATHETERIZATION WITH CORONARY ANGIOGRAM N/A 06/05/2014   Procedure: LEFT AND RIGHT HEART CATHETERIZATION WITH CORONARY ANGIOGRAM;  Surgeon: Mickiel Albany, MD;  Location: [facility] CATH LAB;  Service: Cardiovascular;  Laterality: N/A;   NASAL FRACTURE SURGERY     SHOULDER ARTHROSCOPY WITH ROTATOR CUFF REPAIR AND SUBACROMIAL DECOMPRESSION Right 08/16/2013   Procedure: SHOULDER ARTHROSCOPY WITH ROTATOR CUFF REPAIR AND SUBACROMIAL DECOMPRESSION;  Surgeon: Derald Flattery, MD;  Location: [facility] OR;  Service: Orthopedics;  Laterality: Right;  Right shoulder arthroscopy rotator cuff repair, subacromial decompression.   TONSILLECTOMY      Family History: Family History  Problem Relation Age of Onset   Sleep apnea Sister    Colon cancer Sister     Social History  reports that he quit smoking about 56 years ago. His smoking use included cigarettes. He started smoking about 62 years ago. He has a 15 pack-year smoking history. He has never used smokeless tobacco. He reports current alcohol  use. He reports that he does not use drugs.  Allergies   Allergen Reactions   Lisinopril      Unknown reaction   Amantadines Other (See Comments)    Mental changes   Clonazepam  Other (See Comments)    Mental changes   Zoloft  [Sertraline ] Other (See Comments)    Mental changes    Medications   Current Facility-Administered Medications:    acetaminophen  (TYLENOL ) tablet 650 mg, 650 mg, Oral, Q6H PRN, 650 mg at 02/26/24 2144 **OR** acetaminophen  (TYLENOL ) suppository 650 mg, 650 mg, Rectal, Q6H PRN, Opyd, Santana Cue, MD   atorvastatin  (LIPITOR) tablet 20 mg, 20 mg, Oral, QHS, Opyd, Timothy S, MD, 20 mg at  02/26/24 2144   bisacodyl  (DULCOLAX) EC tablet 5 mg, 5 mg, Oral, Daily PRN, Opyd, Timothy S, MD   carbidopa -levodopa  (SINEMET  CR) 50-200 MG per tablet controlled release 1 tablet, 1 tablet, Oral, QID, Opyd, Timothy S, MD, 1 tablet at 02/26/24 2144   insulin  aspart (novoLOG ) injection 0-5 Units, 0-5 Units, Subcutaneous, QHS, Opyd, Timothy S, MD   insulin  aspart (novoLOG ) injection 0-6 Units, 0-6 Units, Subcutaneous, TID WC, Opyd, Timothy S, MD, 1 Units at 02/26/24 1643   iron sucrose (VENOFER) 200 mg in sodium chloride  0.9 % 100 mL IVPB, 200 mg, Intravenous, Once, Ghimire, Estil Heman, MD   melatonin tablet 10 mg, 10 mg, Oral, QHS, Opyd, Timothy S, MD, 10 mg at 02/26/24 2144   modafinil  (PROVIGIL ) tablet 200 mg, 200 mg, Oral, Daily, Opyd, Timothy S, MD, 200 mg at 02/26/24 1610   ondansetron  (ZOFRAN ) tablet 4 mg, 4 mg, Oral, Q6H PRN **OR** ondansetron  (ZOFRAN ) injection 4 mg, 4 mg, Intravenous, Q6H PRN, Opyd, Timothy S, MD   polyethylene glycol (MIRALAX  / GLYCOLAX ) packet 17 g, 17 g, Oral, Daily PRN, Opyd, Timothy S, MD   rivaroxaban  (XARELTO ) tablet 20 mg, 20 mg, Oral, Q supper, Opyd, Timothy S, MD, 20 mg at 02/26/24 1637   sodium chloride  flush (NS) 0.9 % injection 3 mL, 3 mL, Intravenous, Q12H, Opyd, Timothy S, MD, 3 mL at 02/26/24 2147   tamsulosin (FLOMAX) capsule 0.4 mg, 0.4 mg, Oral, QHS, Opyd, Timothy S, MD, 0.4 mg at 02/26/24 2143  Vitals    Vitals:   02/26/24 2200 02/27/24 0004 02/27/24 0345 02/27/24 0349  BP:  116/72  (!) 128/105  Pulse: 68 76  (!) 194  Resp: (!) 23 16 20 19   Temp:  98 F (36.7 C)  (!) 97.3 F (36.3 C)  TempSrc:  Oral  Oral  SpO2: 95% 98%  100%  Weight:      Height:        Body mass index is 31.5 kg/m.  Physical Exam   Constitutional: Pt is chronically ill-appearing.  Psych: Mood is frustrated. Affect appropriate to situation. No active hallucinations.  Eyes: No scleral injection.  HENT: No OP obstruction.  Head: Normocephalic.  Cardiovascular: Normal rate and regular rhythm.  Respiratory: Effort normal, non-labored breathing.  GI: Soft.  No distension. There is no tenderness.  Skin: WDI.   Neurologic Examination  Neurological Exam:  Mental status/Cognition: Alert, oriented x 4. Eyes Open, eye contact minimal, concentration intact. psychomotor slowed. He has poor attention.  Poor long term memory (could not remember president before Biden).  Speech/language: No dysarthria. Volume normal, fluency, comprehension  to commands intact. 5/5 identify objects.   Cranial nerves:   CN II Pupils equal and reactive to light, she blinks to threat bilaterally.   CN III,IV,VI EOM intact, no gaze preference or deviation, no nystagmus   CN V normal sensation in V1, V2, and V3 segments bilaterally   CN VII no asymmetry, no nasolabial fold flattening   CN VIII Makes eye contact to speech.   CN IX & X normal palatal elevation, no uvular deviation   CN XI 5/5 head turn and 5/5 shoulder shrug bilaterally   CN XII midline tongue protrusion    Motor:  Muscle bulk is WNL. Tone normal. No cogwheel rigidity seen. Drift: minimal  Some tremor UE / LE Strength: 5/5 strength in BL UE, 5/5 in BL LE  Handgrip and push and pull is about 5 out of 5 bilaterally.   Sensation:  Light touch  Intact throughout   Pin prick     Temperature     Vibration    Proprioception      Coordination/Complex Motor:  - Finger to  Nose: Mild action tremor, no ataxia - Mild rubral tremor to BUE with resistance - Heel to shin:  slowed.  - Gait: Deferred  Reflexes: Trace patellar, no achilles (WNL for age). +1 biceps bilateral, trace brachioradialis bilaterally    Labs/Imaging/Neurodiagnostic studies   CBC:  Recent Labs  Lab 2024-03-03 2144 02/26/24 0432 02/27/24 0327  WBC 9.5 9.6 10.2  NEUTROABS 5.9  --   --   HGB 11.2* 10.8* 11.4*  HCT 36.3* 34.7* 35.5*  MCV 84.4 83.8 83.3  PLT 264 255 282   Basic Metabolic Panel:  Lab Results  Component Value Date   NA 137 02/27/2024   K 4.0 02/27/2024   CO2 23 02/27/2024   GLUCOSE 121 (H) 02/27/2024   BUN 18 02/27/2024   CREATININE 1.08 02/27/2024   CALCIUM  9.4 02/27/2024   GFRNONAA >60 02/27/2024   GFRAA >90 07/23/2021   Lipid Panel:  Lab Results  Component Value Date   LDLCALC 51 04/29/2022   HgbA1c:  Lab Results  Component Value Date   HGBA1C 6.8 (H) 02/26/2024   Urine Drug Screen:     Component Value Date/Time   LABOPIA NONE DETECTED 04/14/2020 1700   COCAINSCRNUR NONE DETECTED 04/14/2020 1700   LABBENZ NONE DETECTED 04/14/2020 1700   AMPHETMU NONE DETECTED 04/14/2020 1700   THCU NONE DETECTED 04/14/2020 1700   LABBARB NONE DETECTED 04/14/2020 1700    Alcohol  Level     Component Value Date/Time   ETH <15 Mar 03, 2024 2015   INR  Lab Results  Component Value Date   INR 1.4 (H) 12/22/2023   APTT  Lab Results  Component Value Date   APTT 32 12/22/2023   AED levels: No results found for: "PHENYTOIN", "ZONISAMIDE", "LAMOTRIGINE", "LEVETIRACETA"  CT Head without contrast : 03-03-2024 1. No acute intracranial abnormality. 2. Atrophy with mild small vessel ischemic changes.  Neurodiagnostics rEEG: 4/27 EEG Abnormalities:  1) triphasic waves 2) generalized irregular slow activity 3) slow posterior dominant rhythm  Clinical Interpretation: This EEG is consistent with a generalized nonspecific cerebral dysfunction (encephalopathy). There was  no seizure or seizure predisposition recorded on this study. Please note that lack of epileptiform activity on EEG does not preclude the possibility of epilepsy.   ASSESSMENT  Jordan [name] is a 83 y.o. male w history of Parkinson's, VTE on Xarelto , DM-2, OSA on CPAP, metastatic neuroendocrine tumor on observation-presented with fatigue/confusion/loss of appetite. Pt's seroquel  was stopped yesterday (4/27) to reduce sedation with some improvement. Neurology was consulted on 4/28 to see if there were any other psychoactive medications that might be contributing to his mental status that could be reduced, and to make a plan for the patient's antipsychotic if his visual hallucinations start again. - Patient's CT scan at admission was unremarkable.  - Routine EEG obtained on 4/27 indicated nonspecific cerebral dysfunction.  - Per primary medical team, it appears that most routine causes of toxic/metabolic encephalopathy have been ruled out.  Recommend completing this workup by adding on urine culture, thiamine, folate. Could also consider checking a uric acid level. Could consider an IV iron infusion if desired to correct low iron levels (patient's spouse requested specifically). - The literature supporting major neurocognitive benefits of Aricept (donepezil) in parkinson's disease is mixed and this medication can also cause hallucinations. Given his known hallucinations, would avoid Aricept.   -  In patients with Parkinson's psychosis, pimavanserin is normally the first line antipsychotic medication due to its relative safety, tolerability. and effectiveness in patients with Parkinson's.  However the patient's underlying comorbidities, specifically his congestive heart failure and history of cardiac arrhythmias, make this medication inappropriate. Likewise for clozapine.  - Quetiapine  is sometimes used in patients with Parkinson's psychosis, however the patient's oversedation on even extremely low doses of  this medication indicates that it is probably inappropriate for further use. Could function as an emergency agitation medication. - Patient has previously been on haloperidol , Zyprexa , Compazine , and Seroquel  for Parkinsonian psychosis. Patient has a distant history of sertraline , benzodiazepine, trazodone , and muscle relaxer use.   RECOMMENDATIONS  - Avoid regular use of most antipsychotic medications, as they can worsen Parkinsonian movement disorders. - Has had sedation as a side effect of Seroquel . This medication has been discontinued. - Continue his Sinemet  - Collect labs for thiamine, folate. - Consider uric acid lab - Consider consulting Urology for addressing the uretal calculi while he is inpatient. - Neurohospitalist service will sign off. Please call if there are additional questions.  ____________________________________________________   Signed: Addie Addison, MD [facility] Physician, PGY-1 02/27/2024 10:02 AM  I have seen and examined the patient. I have formulated tha assessment and recommendations. 83 year old male with Parkinsonism, presenting with AMS and fatigue. Exam reveals findings compatible with his diagnosis of Parkinsonism. Has improved with discontinuation of his Seroquel . Recommendations as above.  Electronically signed: Dr. Santrice Muzio

## 2024-02-27 NOTE — Evaluation (Signed)
 Occupational Therapy Evaluation Patient Details Name: Jordan [name] MRN: [id number] DOB: 05-07-1941 Today's Date: 02/27/2024   History of Present Illness   Pt is 83 yo presenting to [facility] ED on 02/25/24 with increased fatigue, confusion, and loss of appetite.  PMHx: Parkinsons, HLD, OSA, DM, HTN, chronic diastolic HF, CVA, frequent falls, L2 compression fx, L hip fx     Clinical Impressions PTA, pt lives with spouse, typically ambulatory household distances with a RW and receives assistance for LB ADLs. Pt presents now with deficits in cognition, sitting/standing balance, strength and endurance. Pt requires Mod A for UB ADL and up to Total A for LB ADLs. Pt requires Mod-Max A for bed mobility, Min A x 2 for brief standing with RW. After brief standing, pt with progressive R lateral lean EOB, decreased responsiveness and brief jerking motions. Returned to supine where vitals stable and pt responsiveness returned- RN notified. Based on current presentation and below functional baseline, recommend continued inpatient follow up therapy, <3 hours/day at DC.     If plan is discharge home, recommend the following:   A lot of help with walking and/or transfers;Two people to help with walking and/or transfers;Two people to help with bathing/dressing/bathroom;A lot of help with bathing/dressing/bathroom     Functional Status Assessment   Patient has had a recent decline in their functional status and demonstrates the ability to make significant improvements in function in a reasonable and predictable amount of time.     Equipment Recommendations   None recommended by OT     Recommendations for Other Services         Precautions/Restrictions   Precautions Precautions: Fall Recall of Precautions/Restrictions: Impaired Restrictions Weight Bearing Restrictions Per Provider Order: No     Mobility Bed Mobility Overal bed mobility: Needs Assistance Bed Mobility: Supine to Sit, Sit to  Supine     Supine to sit: Mod assist Sit to supine: Max assist        Transfers Overall transfer level: Needs assistance Equipment used: Rolling walker (2 wheels) Transfers: Sit to/from Stand Sit to Stand: Min assist, +2 physical assistance, +2 safety/equipment           General transfer comment: Min A x 2 to stand at bedside, R lateral lean noted in standing that progressed after returning to sitting EOB. decreased responsiveness noted and brief "jerking" movements. Returned to bed and RN notified.      Balance Overall balance assessment: History of Falls, Needs assistance Sitting-balance support: Feet supported, Bilateral upper extremity supported Sitting balance-Leahy Scale: Poor Sitting balance - Comments: CGA progressing to Mod A after initial standing attempt Postural control: Right lateral lean Standing balance support: Bilateral upper extremity supported, During functional activity, Reliant on assistive device for balance Standing balance-Leahy Scale: Poor                             ADL either performed or assessed with clinical judgement   ADL Overall ADL's : Needs assistance/impaired Eating/Feeding: Set up;Sitting;Minimal assistance   Grooming: Sitting;Moderate assistance   Upper Body Bathing: Moderate assistance;Sitting   Lower Body Bathing: Maximal assistance;Sitting/lateral leans;Sit to/from stand;+2 for safety/equipment   Upper Body Dressing : Moderate assistance;Sitting   Lower Body Dressing: Maximal assistance;+2 for physical assistance;+2 for safety/equipment;Sit to/from stand       Toileting- Architect and Hygiene: Total assistance;Bed level               Vision Baseline Vision/History:  1 Wears glasses Ability to See in Adequate Light: 1 Impaired Patient Visual Report: No change from baseline Vision Assessment?: No apparent visual deficits     Perception         Praxis         Pertinent Vitals/Pain Pain  Assessment Pain Assessment: No/denies pain     Extremity/Trunk Assessment Upper Extremity Assessment Upper Extremity Assessment: Generalized weakness;Right hand dominant   Lower Extremity Assessment Lower Extremity Assessment: Defer to PT evaluation   Cervical / Trunk Assessment Cervical / Trunk Assessment: Kyphotic   Communication Communication Communication: Impaired Factors Affecting Communication: Reduced clarity of speech;Difficulty expressing self   Cognition Arousal: Alert Behavior During Therapy: Flat affect Cognition: No family/caregiver present to determine baseline             OT - Cognition Comments: eyes closed initially during session but did open them when cued. nonsensical speech at times but other times clear communication noted. Noted with decreased responsiveness after standing attempt- resolved when returned to supine.                 Following commands: Impaired Following commands impaired: Follows one step commands inconsistently, Follows one step commands with increased time     Cueing  General Comments   Cueing Techniques: Verbal cues;Gestural cues;Tactile cues;Visual cues  VSS on RA   Exercises     Shoulder Instructions      Home Living Family/patient expects to be discharged to:: Private residence Living Arrangements: Spouse/significant other;Children Available Help at Discharge: Family;Available 24 hours/day Type of Home: Other(Comment) (condo) Home Access: Level entry;Elevator     Home Layout: One level     Bathroom Shower/Tub: Chief Strategy Officer: Handicapped height Bathroom Accessibility: Yes   Home Equipment: Agricultural consultant (2 wheels);Wheelchair - Manufacturing systems engineer;Other (comment);Grab bars - tub/shower;Grab bars - toilet;Lift chair   Additional Comments: info from last admission, pt unable to give any history or PLOF      Prior Functioning/Environment Prior Level of Function : Needs assist              Mobility Comments: RW for home, w/c for community. Has had falls ADLs Comments: assisted with LB bathing and dressing. wife manages meds    OT Problem List: Decreased strength;Decreased activity tolerance;Impaired balance (sitting and/or standing);Decreased cognition;Decreased safety awareness;Decreased knowledge of use of DME or AE   OT Treatment/Interventions: Self-care/ADL training;Therapeutic exercise;Energy conservation;DME and/or AE instruction;Therapeutic activities;Patient/family education;Balance training      OT Goals(Current goals can be found in the care plan section)   Acute Rehab OT Goals Patient Stated Goal: for wife to visit OT Goal Formulation: With patient Time For Goal Achievement: 03/12/24 Potential to Achieve Goals: Good ADL Goals Pt Will Perform Grooming: with supervision;standing Pt Will Transfer to Toilet: with min assist;ambulating Pt Will Perform Toileting - Clothing Manipulation and hygiene: with min assist;sit to/from stand;sitting/lateral leans Additional ADL Goal #1: Pt to maintain sitting balance EOB > 5 min with no more than CGA during ADLs/functional tasks   OT Frequency:  Min 2X/week    Co-evaluation              AM-PAC OT "6 Clicks" Daily Activity     Outcome Measure Help from another person eating meals?: A Little Help from another person taking care of personal grooming?: A Lot Help from another person toileting, which includes using toliet, bedpan, or urinal?: Total Help from another person bathing (including washing, rinsing, drying)?: A Lot Help from another person  to put on and taking off regular upper body clothing?: A Lot Help from another person to put on and taking off regular lower body clothing?: A Lot 6 Click Score: 12   End of Session Equipment Utilized During Treatment: Gait belt;Rolling walker (2 wheels) Nurse Communication: Mobility status  Activity Tolerance: Patient tolerated treatment well Patient left:  in bed;with call bell/phone within reach;with bed alarm set  OT Visit Diagnosis: Unsteadiness on feet (R26.81);Other abnormalities of gait and mobility (R26.89);Muscle weakness (generalized) (M62.81)                Time: 1610-9604 OT Time Calculation (min): 25 min Charges:  OT General Charges $OT Visit: 1 Visit OT Evaluation $OT Eval Moderate Complexity: 1 Mod  Lawrence Pretty, OTR/L Acute Rehab Services Office: [phone]   Annabella Barr 02/27/2024, 12:06 PM

## 2024-02-27 NOTE — Progress Notes (Signed)
 RE: Jordan [name] Date of Birth: 03/29/2041 Date: 02/27/24  Please be advised that the above-named patient will require a short-term nursing home stay - anticipated 30 days or less for rehabilitation and strengthening.  The plan is for return home.

## 2024-02-27 NOTE — Progress Notes (Signed)
 PROGRESS NOTE        PATIENT DETAILS Name: Jordan [name] Age: 82 y.o. Sex: male Date of Birth: [birth year] Admit Date: 02/25/2024 Admitting Physician Tylene Galla Donzell Gallery, MD ZOX:[id number], Amiel Kalata, MD  Brief Summary: Patient is a 83 y.o.  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  Significant events: 4/26>> admit to [facility]  Significant studies: 4/26>> CXR: No PNA 4/26>> CT head: No acute abnormality 4/26>> UA: 0-5 WBC. 4/27>> TSH: Normal 4/27>>NH4: normal 4/28>> Spot EEG: No seizures 4/28>> ABG: No hypercarbia-normal pH.  Significant microbiology data: 4/27>> blood culture-no growth.  Procedures: None  Consults: None  Subjective: Much more awake alert compared to yesterday.  No major issues overnight.  Still somewhat confused and needs redirection.  Objective: Vitals: Blood pressure 114/65, pulse (!) 194, temperature 97.7 F (36.5 C), temperature source Oral, resp. rate 19, height 5\' 9"  (1.753 m), weight 96.8 kg, SpO2 100%.   Exam: Gen Exam:Alert awake-not in any distress HEENT:atraumatic, normocephalic Chest: B/L clear to auscultation anteriorly CVS:S1S2 regular Abdomen:soft non tender, non distended Extremities:no edema Neurology: Non focal-but has generalized weakness. Skin: no rash  Pertinent Labs/Radiology:    Latest Ref Rng & Units 02/27/2024    3:27 AM 02/26/2024    4:32 AM 02/25/2024    9:44 PM  CBC  WBC 4.0 - 10.5 K/uL 10.2  9.6  9.5   Hemoglobin 13.0 - 17.0 g/dL 40.9  81.1  91.4   Hematocrit 39.0 - 52.0 % 35.5  34.7  36.3   Platelets 150 - 400 K/uL 282  255  264     Lab Results  Component Value Date   NA 137 02/27/2024   K 4.0 02/27/2024   CL 103 02/27/2024   CO2 23 02/27/2024      Assessment/Plan: Acute metabolic encephalopathy Failure to thrive syndrome Unclear etiology Workup negative so  far Some improvement after stopping Seroquel  on 4/27 I have consulted neurology for further recommendations.  History of Parkinson's disease Continue Sinemet   History of visual hallucinations Likely due to Parkinson's disease related cognitive issues. Seroquel  stopped on 4/27-given excessive daytime sleepiness/failure to thrive syndrome-a bit more awake/alert today.   Neurology salted to see if we need to start an alternate agent.  History of daytime somnolence Apparently worse for the past several days prior to this hospitalization. A bit more awake and alert today-off Seroquel  On modafinil   History of orthostatic hypotension related to Parkinson's disease History of worsening and debility/weakness Previously able to use a walker and ambulate around the house-now barely able to ambulate. See above plans-regarding workup-possible neurology evaluation at some point Continue to work with PT/OT.  SNF recommended.  History of CVA Nonfocal exam Not on antiplatelets as an anticoagulation Continue statin.  History of chronic HFpEF Euvolemic  DM-2 CBG stable-SSI Recent Labs    02/26/24 1608 02/26/24 1949 02/27/24 0842  GLUCAP 163* 143* 133*     History of VTE Xarelto   History of PPM implantation  Recent history of Kidney stones Follow with Urology at [facility]-ureteroscopy planned-but postponed 2/2 to patient fatigue/weakness. Thankfully renal function is stable-no evidence of UTI Plan is to follow with his urologist-close monitoring.  IV iron being infused today-PT/OT following-SNF planned.  Iron patient's anemia Was supposed to start IV iron as an outpatient-discussed with pharmacy  team-will be getting IV iron here in the hospital.   Etiology is likely slow loss through GI or GU tract (stones/microscopic hematuria on UA).  He is currently too frail to tolerate any sort of procedures and needs PT/OT and close outpatient follow-up-thankfully hemoglobin is stable.  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   Weight as of this encounter: 96.8 kg.   Code status:   Code Status: Full Code   DVT Prophylaxis: rivaroxaban  (XARELTO ) tablet 20 mg     Family Communication: Spouse-Kelly Brinkmeyer-[phone]-updated over the phone on 4/28   Disposition Plan: Status is: Observation The patient will require care spanning > 2 midnights and should be moved to inpatient because: Severity of illness   Planned Discharge Destination:SNF   Diet: Diet Order             Diet regular Room service appropriate? Yes; Fluid consistency: Thin  Diet effective now                     Antimicrobial agents: Anti-infectives (From admission, onward)    None        MEDICATIONS: Scheduled Meds:  atorvastatin   20 mg Oral QHS   carbidopa -levodopa   1 tablet Oral QID   insulin  aspart  0-5 Units Subcutaneous QHS   insulin  aspart  0-6 Units Subcutaneous TID WC   melatonin  10 mg Oral QHS   modafinil   200 mg Oral Daily   rivaroxaban   20 mg Oral Q supper   sodium chloride  flush  3 mL Intravenous Q12H   tamsulosin  0.4 mg Oral QHS   Continuous Infusions:   PRN Meds:.acetaminophen  **OR** acetaminophen , bisacodyl , ondansetron  **OR** ondansetron  (ZOFRAN ) IV, polyethylene glycol   I have personally reviewed following labs and imaging studies  LABORATORY DATA: CBC: Recent Labs  Lab 02/25/24 2144 02/26/24 0432 02/27/24 0327  WBC 9.5 9.6 10.2  NEUTROABS 5.9  --   --   HGB 11.2* 10.8* 11.4*  HCT 36.3* 34.7* 35.5*  MCV 84.4 83.8 83.3  PLT 264 255 282    Basic Metabolic Panel: Recent Labs  Lab 02/25/24 2015 02/26/24 0432 02/27/24 0327  NA 137 140 137  K  4.4 4.2 4.0  CL 104 106 103  CO2 23 24 23   GLUCOSE 132* 121* 121*  BUN 17 16 18   CREATININE 0.93 0.87 1.08  CALCIUM  9.3 9.3 9.4  MG 1.6* 2.2  --   PHOS  --  3.3  --     GFR: Estimated Creatinine Clearance: 58.4 mL/min (by C-G formula based on SCr of 1.08 mg/dL).  Liver Function Tests: Recent Labs  Lab 02/25/24 2015  AST 21  ALT 11  ALKPHOS 48  BILITOT 0.3  PROT 6.7  ALBUMIN 3.3*   No results for input(s): "LIPASE", "AMYLASE" in the last 168 hours. Recent Labs  Lab 02/25/24 2015 02/26/24 0814  AMMONIA 42* <13    Coagulation Profile: No results for input(s): "INR", "PROTIME" in the last 168 hours.  Cardiac Enzymes: No results for input(s): "CKTOTAL", "CKMB", "CKMBINDEX", "TROPONINI" in the last 168 hours.  BNP (last 3 results) No results for input(s): "PROBNP" in the last 8760 hours.  Lipid Profile: No results for input(s): "CHOL", "HDL", "LDLCALC", "  TRIG", "CHOLHDL", "LDLDIRECT" in the last 72 hours.  Thyroid  Function Tests: Recent Labs    02/26/24 0432  TSH 0.796    Anemia Panel: Recent Labs    02/27/24 0327  VITAMINB12 592  FOLATE 39.5  FERRITIN 20*  TIBC 393  IRON 40*  RETICCTPCT 2.2    Urine analysis:    Component Value Date/Time   COLORURINE AMBER (A) 02/25/2024 2206   APPEARANCEUR HAZY (A) 02/25/2024 2206   LABSPEC 1.015 02/25/2024 2206   PHURINE 6.0 02/25/2024 2206   GLUCOSEU NEGATIVE 02/25/2024 2206   HGBUR LARGE (A) 02/25/2024 2206   BILIRUBINUR NEGATIVE 02/25/2024 2206   KETONESUR 5 (A) 02/25/2024 2206   PROTEINUR 100 (A) 02/25/2024 2206   NITRITE NEGATIVE 02/25/2024 2206   LEUKOCYTESUR NEGATIVE 02/25/2024 2206    Sepsis Labs: Lactic Acid, Venous    Component Value Date/Time   LATICACIDVEN 1.0 01/01/2022 0531    MICROBIOLOGY: Recent Results (from the past 240 hours)  Culture, blood (Routine X 2) w Reflex to ID Panel     Status: None (Preliminary result)   Collection Time: 02/26/24 11:28 AM   Specimen: BLOOD LEFT HAND   Result Value Ref Range Status   Specimen Description BLOOD LEFT HAND  Final   Special Requests   Final    BOTTLES DRAWN AEROBIC AND ANAEROBIC Blood Culture adequate volume   Culture   Final    NO GROWTH < 24 HOURS Performed at [facility], [address], [place], [place] [address]    Report Status PENDING  Incomplete  Culture, blood (Routine X 2) w Reflex to ID Panel     Status: None (Preliminary result)   Collection Time: 02/26/24 11:35 AM   Specimen: BLOOD RIGHT HAND  Result Value Ref Range Status   Specimen Description BLOOD RIGHT HAND  Final   Special Requests   Final    BOTTLES DRAWN AEROBIC AND ANAEROBIC Blood Culture adequate volume   Culture   Final    NO GROWTH < 24 HOURS Performed at [facility], [address] [place], [place] [address]    Report Status PENDING  Incomplete    RADIOLOGY STUDIES/RESULTS: CT HEAD WO CONTRAST ( ) Result Date: 02/25/2024 EXAM: CT HEAD WITHOUT 02/25/2024 09:33:24 PM TECHNIQUE: CT of the head was performed without the administration of intravenous contrast. Automated exposure control, iterative reconstruction, and/or weight based adjustment of the mA/kV was utilized to reduce the radiation dose to as low as reasonably achievable. COMPARISON: 01/04/2024 CLINICAL HISTORY: Mental status change, unknown cause. Chief complaints: Weakness. FINDINGS: BRAIN AND VENTRICLES: There is no acute intracranial hemorrhage, mass effect or midline shift. No abnormal extra-axial fluid collection. The gray-white differentiation is maintained without evidence of an acute infarct. There is no evidence of hydrocephalus. Mild scattered periventricular and subcortical low density, likely reflecting small vessel ischemic changes. Intracranial arteriosclerosis. ORBITS: The visualized portion of the orbits demonstrate no acute abnormality. SINUSES: Opacification of the left frontal sinus. Partial opacification of the bilateral ethmoid and maxillary sinuses. SOFT  TISSUES AND SKULL: No acute abnormality of the visualized skull or soft tissues. IMPRESSION: 1. No acute intracranial abnormality. 2. Atrophy with mild small vessel ischemic changes. Electronically signed by: Zadie Herter MD 02/25/2024 09:55 PM EDT RP Workstation: [id number]   DG Chest Port 1 View Result Date: 02/25/2024 EXAM: 1 VIEW(S) XRAY OF THE CHEST 02/25/2024 08:04:56 PM COMPARISON: CT chest 02/02/2024 CLINICAL HISTORY: Altered. FINDINGS: LUNGS AND PLEURA: Mild left basilar scarring/atelectasis. No consolidation. No pulmonary edema. No  pleural effusion. No pneumothorax. HEART AND MEDIASTINUM: Left subclavian pacemaker. Thoracic aortic atherosclerosis. No acute abnormality of the cardiac and mediastinal silhouettes. BONES AND SOFT TISSUES: No acute osseous abnormality. IMPRESSION: 1. No acute cardiopulmonary pathology. Electronically signed by: Zadie Herter MD 02/25/2024 09:53 PM EDT RP Workstation: [id number]     LOS: 0 days   Kimberly Penna, MD  [facility]    To contact the attending provider between 7A-7P or the covering provider during after hours 7P-7A, please log into the web site [email or web address] and access using universal [place] password for that web site. If you do not have the password, please call the hospital operator.  02/27/2024, 11:16 AM

## 2024-02-27 NOTE — Progress Notes (Signed)
 VAST consult for PIV. Arrived to room. Patient in the middle of having pt care done. Requested nurse to reconsult when patient available for PIV assessment/placement. Theophilus Fitz, RN VAST

## 2024-02-27 NOTE — Progress Notes (Signed)
 Per OT patient had  brief episode of decreased responsiveness while standing up.RN rushed to the room to assess the  patient.Pt was laying comfortably on bed,alert and oriented*3.Vitals monitored(BP-93/59,HR 66,RR-20,SPO2-91% on room air)

## 2024-02-27 NOTE — Plan of Care (Signed)

## 2024-02-27 NOTE — NC FL2 (Signed)
 [place]  MEDICAID FL2 LEVEL OF CARE FORM     IDENTIFICATION  Patient Name: Jordan [name] Birthdate: 1941/10/27 Sex: male Admission Date (Current Location): 02/25/2024  [facility] and [place] Number:  Producer, television/film/video and Address:  [facility], 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   History of DVT (deep vein thrombosis) 04/29/2022   Closed intertrochanteric fracture of left hip, initial encounter [facility])    Closed left hip fracture (HCC) secondary to fall at home 02/08/2022   Paroxysmal atrial fibrillation (HCC) 02/08/2022   Debility 01/08/2022   Constipation 01/07/2022   Physical deconditioning 01/07/2022   Acute metabolic encephalopathy 01/04/2022   Hypomagnesemia 01/04/2022   Pancreatitis 12/30/2021   Parkinson's disease (HCC) 12/21/2021   Parkinson disease (HCC) 12/18/2021   Frequent falls 12/18/2021   History of CVA (cerebrovascular accident) 12/18/2021   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   Hallucinations, visual 07/30/2021   Acetabulum fracture, left (HCC) 06/25/2021   Acute hip pain, left 06/24/2021   Fall at home, initial encounter 06/24/2021   Leukocytosis 06/24/2021   Chronic diastolic heart failure (HCC)    Sinusitis, acute maxillary 06/24/2020   AKI (acute kidney injury) (HCC)    Labile blood glucose    Cough    Benign essential HTN    Skin lesion of hand     Hypoalbuminemia due to protein-calorie malnutrition (HCC)    Controlled type 2 diabetes mellitus with hyperglycemia, without long-term current use of insulin  (HCC)    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   Morbid obesity (HCC)    Acute blood loss anemia    Acute thalamic infarction (HCC) 04/15/2020   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   TIA (transient ischemic attack) 02/17/2018   Type 2 diabetes mellitus with hyperlipidemia (HCC) 02/17/2018   Insomnia 02/01/2018   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   Chronotropic incompetence 07/09/2014   Dyspnea on exertion 03/03/2014   HLD (hyperlipidemia) 12/13/2007   Essential hypertension 12/13/2007   Class 1 obesity 12/05/2007   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   Coronary atherosclerosis 12/05/2007    Orientation RESPIRATION BLADDER Height & Weight     Self, Place, Situation  Normal Continent, External catheter Weight: 213 lb 4.8 oz (96.8 kg) Height:  5\' 9"  (175.3 cm)  BEHAVIORAL SYMPTOMS/MOOD NEUROLOGICAL BOWEL NUTRITION STATUS      Continent Diet (See DC Summary)  AMBULATORY STATUS COMMUNICATION OF NEEDS Skin   Extensive Assist Verbally PU Stage and Appropriate Care (Pressure ulcer stage I on coccyx)                       Personal Care Assistance Level of Assistance  Bathing, Feeding, Dressing Bathing Assistance: Maximum assistance Feeding assistance: Limited assistance Dressing Assistance: Limited assistance     Functional Limitations Info             SPECIAL CARE FACTORS FREQUENCY  PT (By licensed PT), OT (By licensed OT)     PT Frequency: 5x/week OT Frequency: 5x/week            Contractures Contractures Info: Not present    Additional Factors Info  Code Status, Allergies, Insulin  Sliding Scale Code Status Info: Full Allergies Info: Lisinopril , Amantadines, Clonazepam , Zoloft  (Sertraline )   Insulin  Sliding Scale Info: See dc summary       Current  Medications (02/27/2024):  This is the current hospital active medication list Current Facility-Administered Medications  Medication Dose Route Frequency Provider Last Rate Last Admin   acetaminophen  (TYLENOL ) tablet 650 mg  650 mg Oral Q6H PRN Opyd, Timothy S, MD   650 mg at 02/26/24 2144   Or   acetaminophen  (TYLENOL ) suppository 650 mg  650 mg Rectal Q6H PRN Opyd, Timothy S, MD       atorvastatin  (LIPITOR) tablet 20 mg  20 mg Oral QHS Opyd, Timothy S, MD   20 mg at 02/26/24 2144   bisacodyl  (DULCOLAX) EC tablet 5 mg  5 mg Oral Daily PRN Opyd, Timothy S, MD       carbidopa -levodopa  (SINEMET  CR) 50-200 MG per tablet controlled release 1 tablet  1 tablet Oral QID Opyd, Timothy S, MD   1 tablet at 02/27/24 6295   insulin  aspart (novoLOG ) injection 0-5 Units  0-5 Units Subcutaneous QHS Opyd, Timothy S, MD       insulin  aspart (novoLOG ) injection 0-6 Units  0-6 Units Subcutaneous TID WC Opyd, Timothy S, MD   1 Units at 02/27/24 1200   [START ON 02/28/2024] iron sucrose (VENOFER) 400 mg in sodium chloride  0.9 % 250 mL IVPB  400 mg Intravenous Q24H Ghimire, Estil Heman, MD       melatonin tablet 10 mg  10 mg Oral QHS Opyd, Timothy S, MD   10 mg at 02/26/24 2144   modafinil  (PROVIGIL ) tablet 200 mg  200 mg Oral Daily Opyd, Timothy S, MD   200 mg at 02/27/24 2841   ondansetron  (ZOFRAN ) tablet 4 mg  4 mg Oral Q6H PRN Opyd, Timothy S, MD       Or   ondansetron  (ZOFRAN ) injection 4 mg  4 mg Intravenous Q6H PRN Opyd, Timothy S, MD       polyethylene glycol (MIRALAX  / GLYCOLAX ) packet 17 g  17 g Oral Daily PRN Opyd, Timothy S, MD       rivaroxaban  (XARELTO ) tablet 20 mg  20 mg Oral Q supper Opyd, Timothy S, MD   20 mg at 02/26/24 1637   sodium chloride  flush (NS) 0.9 % injection 3 mL  3 mL Intravenous Q12H Opyd, Timothy S, MD   3 mL at 02/26/24 2147   tamsulosin (FLOMAX) capsule 0.4 mg  0.4 mg Oral QHS Opyd, Timothy S, MD   0.4 mg at 02/26/24 2143     Discharge Medications: Please see discharge summary for  a list of discharge medications.  Relevant Imaging Results:  Relevant Lab Results:   Additional Information SS# [id number] [place], LCSW

## 2024-02-28 ENCOUNTER — Ambulatory Visit: Payer: Medicare (Managed Care) | Admitting: Adult Health

## 2024-02-28 ENCOUNTER — Other Ambulatory Visit: Payer: Self-pay | Admitting: Physical Medicine and Rehabilitation

## 2024-02-28 DIAGNOSIS — G9340 Encephalopathy, unspecified: Secondary | ICD-10-CM | POA: Diagnosis not present

## 2024-02-28 DIAGNOSIS — Z8673 Personal history of transient ischemic attack (TIA), and cerebral infarction without residual deficits: Secondary | ICD-10-CM | POA: Diagnosis not present

## 2024-02-28 DIAGNOSIS — Z86718 Personal history of other venous thrombosis and embolism: Secondary | ICD-10-CM | POA: Diagnosis not present

## 2024-02-28 DIAGNOSIS — E1169 Type 2 diabetes mellitus with other specified complication: Secondary | ICD-10-CM | POA: Diagnosis not present

## 2024-02-28 LAB — GLUCOSE, CAPILLARY
Glucose-Capillary: 139 mg/dL — ABNORMAL HIGH (ref 70–99)
Glucose-Capillary: 176 mg/dL — ABNORMAL HIGH (ref 70–99)
Glucose-Capillary: 173 mg/dL — ABNORMAL HIGH (ref 70–99)

## 2024-02-28 LAB — CBC
HCT: 33.1 % — ABNORMAL LOW (ref 39.0–52.0)
Hemoglobin: 10.6 g/dL — ABNORMAL LOW (ref 13.0–17.0)
MCH: 26.2 pg (ref 26.0–34.0)
MCHC: 32 g/dL (ref 30.0–36.0)
MCV: 81.9 fL (ref 80.0–100.0)
Platelets: 250 10*3/uL (ref 150–400)
RBC: 4.04 MIL/uL — ABNORMAL LOW (ref 4.22–5.81)
RDW: 19 % — ABNORMAL HIGH (ref 11.5–15.5)
WBC: 8.6 10*3/uL (ref 4.0–10.5)
nRBC: 0 % (ref 0.0–0.2)

## 2024-02-28 LAB — BASIC METABOLIC PANEL WITH GFR
Anion gap: 12 (ref 5–15)
BUN: 20 mg/dL (ref 8–23)
CO2: 25 mmol/L (ref 22–32)
Calcium: 9.3 mg/dL (ref 8.9–10.3)
Chloride: 100 mmol/L (ref 98–111)
Creatinine, Ser: 1.06 mg/dL (ref 0.61–1.24)
GFR, Estimated: >60 mL/min (ref >60)
Glucose, Bld: 132 mg/dL — ABNORMAL HIGH (ref 70–99)
Potassium: 3.6 mmol/L (ref 3.5–5.1)
Sodium: 137 mmol/L (ref 135–145)

## 2024-02-28 MED ORDER — SODIUM CHLORIDE 0.9 % IV SOLN
400.0000 mg | INTRAVENOUS | Status: AC
  Administered 2024-02-28 – 2024-02-29 (×2): 400 mg via INTRAVENOUS
  Filled 2024-02-28 (×2): qty 20

## 2024-02-28 MED ORDER — QUETIAPINE FUMARATE 25 MG PO TABS
25.0000 mg | ORAL_TABLET | Freq: Every evening | ORAL | Status: DC
  Administered 2024-02-28 – 2024-03-01 (×3): 25 mg via ORAL
  Filled 2024-02-28 (×3): qty 1

## 2024-02-28 NOTE — TOC Progression Note (Addendum)
 Transition of Care [facility]) - Progression Note    Patient Details  Name: Jordan [name] MRN: [id number] Date of Birth: 16-Jan-1941  [facility]) CM/SW Contact  Jannice Mends, LCSW Phone Number: 02/28/2024, 11:01 AM  Clinical Narrative:    11:02 AM-CSW requested [facility] IR review referral per patient's spouse request.   [facility] requesting PT recommendation to state CIR for their MD to consider. They also request that ureteroscopy be scheduled farther out so it will not interfere with his stay at inpatient rehab. CSW alerted MD who reported that patient would need to follow up outpatient to manage that as there are no plans to schedule at this time. CSW will follow up with patient's spouse.   Expected Discharge Plan: Skilled Nursing Facility Barriers to Discharge: Continued Medical Work up, English as a second language teacher, SNF Pending bed offer  Expected Discharge Plan and Services In-house Referral: Clinical Social Work   Post Acute Care Choice: IP Rehab Living arrangements for the past 2 months: Single Family Home                                       Social Determinants of Health (SDOH) Interventions SDOH Screenings   Food Insecurity: No Food Insecurity (02/26/2024)  Housing: Unknown (02/26/2024)  Transportation Needs: No Transportation Needs (02/26/2024)  Utilities: Not At Risk (02/26/2024)  Depression (PHQ2-9): Low Risk  (04/12/2023)  Social Connections: Moderately Integrated (02/26/2024)  Tobacco Use: Medium Risk (02/25/2024)    Readmission Risk Interventions     No data to display

## 2024-02-28 NOTE — Consult Note (Signed)
  The patient was seen and assessed by this psychiatric nurse practitioner following a consult placed for agitation at night and assistance with Seroquel  taper and medication management, as recommended by neurology. On evaluation, the patient was initially observed sleeping but awoke to verbal stimuli. He was responsive and alert, though oriented only to self. When asked his location and the date, he stated he was "at home" and that today's date was "01/16/2004."  Compared to prior documentation, the patient was able to answer most assessment questions with improved accuracy compared to the previous night. When asked about his sleep, he responded, "Better than yesterday." He reports that he is able to feed himself; however, an uneaten breakfast tray remained at his bedside, suggesting potential inattention or apathy, which can accompany delirium.  When assessing for psychosis, hallucinations, or signs of delirium, the patient stated, "Yes, not today, but they looked like you and me -- better than the day before," indicating a waxing and waning course of perceptual disturbances.  The patient is currently on day 2 of admission and exhibits features concerning for hospital-associated delirium, likely multifactorial and exacerbated by the inpatient environment. The behavioral disturbances, fluctuating orientation, perceptual disturbances, and functional inconsistencies are consistent with hyperactive delirium with some elements of resolution overnight.  At this time, we will restart Seroquel  at 1700 to target symptoms of sundowning and assist with behavioral management overnight. Delirium prevention strategies (e.g., reorientation, sleep hygiene, minimizing nighttime disturbances) are also strongly recommended.  At this time, the patient's presentation is most consistent with hyperactive delirium, most likely due to multiple etiologies including but not limited to infection, medications, pain, altered  sleep/wake cycle, and limited mobility.  During this time period, minimization of delirogenic insults will be of utmost importance; this includes promoting the normal circadian cycle, minimizing lines/tubes, avoiding deliriogenic medications such as benzodiazepines and anticholinergic medications, and frequently reorienting the patient. Symptomatic treatment for agitation can be provided by antipsychotic medications, though it is important to remember that these do not treat the underlying etiology of delirium. Notably, there can be a time lag effect between treatment of a medical problem and resolution of delirium. This time lag effect may be of longer duration in the elderly, and those with underlying cognitive impairment or brain injury, as in this pt with 5+ years of cognitive decline  Today we did consider starting donepezil, however primary concerns seems to be on agitation at night.  WIll restart Seroquel  25mg  po at bedtime evening to help with sundowning. .-Psychiatry will continue to follow.

## 2024-02-28 NOTE — Progress Notes (Signed)
 Physical Therapy Treatment Patient Details Name: Jordan [name] MRN: [id number] DOB: Jan 23, 1941 Today's Date: 02/28/2024   History of Present Illness Pt is 83 yo presenting to [facility] ED on 02/25/24 with increased fatigue, confusion, and loss of appetite.  PMHx: Parkinsons, HLD, OSA, DM, HTN, chronic diastolic HF, CVA, frequent falls, L2 compression fx, L hip fx    PT Comments  Shows improved interaction and ability to mobilize today with min assist for bed mobility and sit to stand transfer. Unfortunately, he was limited today by a near syncopal episode during transfer; BP in recliner immediately checked 72/50 (MAP 58) HR 81. Returned to bed, trendelenburg 110/58 (MAP 71) HR 80. More alert and interactive again. RN and MD notified. Requested RN obtain thigh high compression stockings and abdominal binder for OOB activity. Spoke with patient's wife over the phone who reports pt was ambulatory with a walker after a short period of rehab last month but started having drop attacks with LEs buckling over the past couple of weeks and has declined since (similar to what was witnessed with hypotensive episode today.) Patient will benefit from intensive inpatient follow-up therapy, >3 hours/day. Suspect function will improve as medical issues stabilize. Goals updated based on improved mobility prior to near syncopal episode today. Will progress as tolerated.   If plan is discharge home, recommend the following: Two people to help with walking and/or transfers;A lot of help with bathing/dressing/bathroom;Assistance with feeding;Assistance with cooking/housework;Assist for transportation;Supervision due to cognitive status   Can travel by private vehicle     No  Equipment Recommendations  None recommended by PT    Recommendations for Other Services       Precautions / Restrictions Precautions Precautions: Fall Recall of Precautions/Restrictions: Impaired Restrictions Weight Bearing Restrictions Per  Provider Order: No     Mobility  Bed Mobility Overal bed mobility: Needs Assistance Bed Mobility: Supine to Sit, Sit to Supine     Supine to sit: Min assist Sit to supine: Min assist   General bed mobility comments: Min assist to facilitate and rise to EOB, cues for technique. Min assist for LE support, increased cues to lower back to supine (after orthostatic episode.)    Transfers Overall transfer level: Needs assistance Equipment used: Rolling walker (2 wheels), None Transfers: Sit to/from Stand, Bed to chair/wheelchair/BSC Sit to Stand: Min assist   Step pivot transfers: Max assist Squat pivot transfers: Min assist     General transfer comment: Min assist for boost to stand with cues for technique, hand placement, and faciltiate step pivot towards recliner. Pt became less alert, and developed jerking movements before LEs buckled and needed max assist to sit safely in recliner (near syncopal) Reclined back and checked BP with significant drop to 72/50 (MAP 58). Symptoms settled while reclined and tolerated squat pivot transfer with min assist back to bed. MD/RN notified.    Ambulation/Gait               General Gait Details: Deferred due to hypotension   Stairs             Wheelchair Mobility     Tilt Bed    Modified Rankin (Stroke Patients Only)       Balance Overall balance assessment: History of Falls, Needs assistance Sitting-balance support: Feet supported, Bilateral upper extremity supported Sitting balance-Leahy Scale: Poor Sitting balance - Comments: CGA EOB   Standing balance support: Bilateral upper extremity supported, During functional activity, Reliant on assistive device for balance Standing balance-Leahy Scale: Poor  Communication Communication Communication: No apparent difficulties  Cognition Arousal: Alert Behavior During Therapy: WFL for tasks assessed/performed   PT - Cognitive  impairments: No family/caregiver present to determine baseline, Orientation, Memory, Attention, Sequencing, Problem solving, Safety/Judgement   Orientation impairments: Place, Situation                     Following commands: Impaired Following commands impaired: Follows one step commands inconsistently, Follows one step commands with increased time    Cueing Cueing Techniques: Verbal cues, Gestural cues, Tactile cues, Visual cues  Exercises      General Comments General comments (skin integrity, edema, etc.): Near syncopal episode during transfer; BP in recliner immediately checked 72/50 (MAP 58) HR 81. Returned to bed, trendelenburg 110/58 (MAP 71) HR 80. Gradually increased HOB but limited to approx 15 deg. RN and MD notified.      Pertinent Vitals/Pain Pain Assessment Pain Assessment: No/denies pain    Home Living                          Prior Function            PT Goals (current goals can now be found in the care plan section) Acute Rehab PT Goals Patient Stated Goal: none stated PT Goal Formulation: Patient unable to participate in goal setting Time For Goal Achievement: 03/11/24 Potential to Achieve Goals: Fair Progress towards PT goals: Progressing toward goals    Frequency    Min 2X/week      PT Plan      Co-evaluation              AM-PAC PT "6 Clicks" Mobility   Outcome Measure  Help needed turning from your back to your side while in a flat bed without using bedrails?: A Little Help needed moving from lying on your back to sitting on the side of a flat bed without using bedrails?: A Little Help needed moving to and from a bed to a chair (including a wheelchair)?: A Lot Help needed standing up from a chair using your arms (e.g., wheelchair or bedside chair)?: A Little Help needed to walk in hospital room?: Total Help needed climbing 3-5 steps with a railing? : Total 6 Click Score: 13    End of Session Equipment Utilized  During Treatment: Gait belt Activity Tolerance: Treatment limited secondary to medical complications (Comment) (hypotension) Patient left: in bed;with call bell/phone within reach;with bed alarm set (fall mat in place) Nurse Communication: Mobility status;Other (comment) (Near syncope; Request Thigh high compression stockings and abdominal binder for pt.) PT Visit Diagnosis: History of falling (Z91.81);Muscle weakness (generalized) (M62.81);Difficulty in walking, not elsewhere classified (R26.2);Unsteadiness on feet (R26.81);Dizziness and giddiness (R42)     Time: 1610-9604 PT Time Calculation (min) (ACUTE ONLY): 48 min  Charges:    $Therapeutic Activity: 38-52 mins PT General Charges $$ ACUTE PT VISIT: 1 Visit                     Jory Ng, PT, DPT [facility]  Rehabilitation Services Physical Therapist Office: [phone] Website: [email or web address]    Alinda Irani 02/28/2024, 3:46 PM

## 2024-02-28 NOTE — Plan of Care (Signed)
   Problem: Coping: Goal: Ability to adjust to condition or change in health will improve Outcome: Not Progressing   Problem: Fluid Volume: Goal: Ability to maintain a balanced intake and output will improve Outcome: Not Progressing

## 2024-02-28 NOTE — Progress Notes (Signed)
   Inpatient Rehab Admissions Coordinator :  Per therapy change in recommendations, patient was screened for CIR candidacy by Jeannetta Millman RN MSN.  Noted recent [facility] last month and wife prefers [facility] and SNF if not. At this time patient appears to be a potential candidate for CIR. I will hold rehab consult at this time due to wife's preference. Please call me with any questions.  Jeannetta Millman RN MSN Admissions Coordinator [phone]

## 2024-02-28 NOTE — Telephone Encounter (Signed)
 Community of Care request has been denied.  Mr. Riccardo, [place] 8534 03/19/1941 has a [facility] consult for anemia and iron infusions.  He is scheduled for 03/12/2024 at the [facility] Veteran's will need to use alternate form of insurance if they wish to continue with community services.  We will d/c the treatment plan.  Thanks Burdette Carolin

## 2024-02-28 NOTE — Progress Notes (Signed)
 Pt noted to be very restless, confused, intermittent periods where he hallucinating, disrobing, pulling off tele leads, turning self side ways in bed, attempting to "go downstairs". Frequent redirection and reorientation noted, pt agitated with redirection of staff. PRN seroquel  given with minimal effects at this time. Frequent rounding provided by staff.

## 2024-02-28 NOTE — Progress Notes (Addendum)
 PROGRESS NOTE        PATIENT DETAILS Name: Jordan [name] Age: 83 y.o. Sex: male Date of Birth: 05-18-1941 Admit Date: 02/25/2024 Admitting Physician Tylene Galla Donzell Gallery, MD ZOX:[id number], Amiel Kalata, MD  Brief Summary: Patient is a 83 y.o.  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  Significant events: 4/26>> admit to [facility]  Significant studies: 4/26>> CXR: No PNA 4/26>> CT head: No acute abnormality 4/26>> UA: 0-5 WBC. 4/27>> TSH: Normal 4/27>>NH4: normal 4/28>> Spot EEG: No seizures 4/28>> ABG: No hypercarbia-normal pH.  Significant microbiology data: 4/27>> blood culture-no growth.  Procedures: None  Consults: Neurology  Subjective: Some restlessness overnight-required 1 dose of Seroquel .  Sleepy but awake this morning.  Objective: Vitals: Blood pressure 120/66, pulse 74, temperature 98.7 F (37.1 C), temperature source Axillary, resp. rate 18, height 5\' 9"  (1.753 m), weight 96.8 kg, SpO2 91%.   Exam: Gen Exam:Alert awake HEENT:atraumatic, normocephalic Chest: B/L clear to auscultation anteriorly CVS:S1S2 regular Abdomen:soft non tender, non distended Extremities:no edema Neurology: Non focal-non focal exam. Skin: no rash  Pertinent Labs/Radiology:    Latest Ref Rng & Units 02/28/2024    3:22 AM 02/27/2024    3:27 AM 02/26/2024    4:32 AM  CBC  WBC 4.0 - 10.5 K/uL 8.6  10.2  9.6   Hemoglobin 13.0 - 17.0 g/dL 40.9  81.1  91.4   Hematocrit 39.0 - 52.0 % 33.1  35.5  34.7   Platelets 150 - 400 K/uL 250  282  255     Lab Results  Component Value Date   NA 137 02/28/2024   K 3.6 02/28/2024   CL 100 02/28/2024   CO2 25 02/28/2024      Assessment/Plan: Acute metabolic encephalopathy Failure to thrive syndrome Multifactorial etiology-in the setting of Seroquel  use/Parkinson's disease/known cognitive  issues. Some improvement after stopping Seroquel  on 4/27-however had some restlessness/agitation last night-given 1 dose of Seroquel .  History of Parkinson's disease Continue Sinemet   History of visual hallucinations Likely due to Parkinson's disease related cognitive issues. Seroquel  stopped on 4/27-given excessive daytime sleepiness/failure to thrive syndrome-a bit more awake/alert today.   Unfortunately last night got agitated/restless last night-given 1 dose of as needed Seroquel .  Will discuss with neurology regarding other options.  Addendum Discussed with neurology team-difficult situation-no good options-recommendations are to get psychiatry opinion.  Psychiatry consulted.  History of daytime somnolence Apparently worse for the past several days prior to this hospitalization. On modafinil   History of orthostatic hypotension related to Parkinson's disease History of worsening and debility/weakness Previously able to use a walker and ambulate around the house-now barely able to ambulate. See above plans-regarding workup-possible neurology evaluation at some point Continue to work with PT/OT.  Social work following for SNF/rehab placement.  History of CVA Nonfocal exam Not on antiplatelets as an anticoagulation Continue statin.  History of chronic HFpEF Euvolemic  DM-2 CBG stable-SSI Recent Labs    02/27/24 1121 02/27/24 1742 02/27/24 2101  GLUCAP 177* 192* 173*     History of VTE Xarelto   History of PPM implantation  Recent history of Kidney stones Follow with Urology at [facility]-ureteroscopy planned-but postponed 2/2 to patient fatigue/weakness. Thankfully renal function is stable-no evidence of UTI Plan is to follow with his urologist-close monitoring.  IV iron being infused today-PT/OT  following-SNF planned.  Iron patient's anemia Was supposed to start IV iron as an outpatient-discussed with pharmacy team-will be getting IV iron here in the hospital.    Etiology is likely slow loss through GI or GU tract (stones/microscopic hematuria on UA).  He is currently too frail to tolerate any sort of procedures and needs PT/OT and close outpatient follow-up-thankfully hemoglobin is stable.  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   Weight as of this encounter: 96.8 kg.   Code status:   Code Status: Full Code   DVT Prophylaxis: rivaroxaban  (XARELTO ) tablet 20 mg     Family Communication: Spouse-Kelly [place]-[phone]-updated over the phone on 4/29   Disposition Plan: Status is: Observation The patient will require care spanning > 2 midnights and should be moved to inpatient because: Severity of illness   Planned Discharge Destination:SNF   Diet: Diet Order             Diet regular Room service appropriate? Yes; Fluid consistency: Thin  Diet effective now                     Antimicrobial agents: Anti-infectives (From admission, onward)    None        MEDICATIONS: Scheduled Meds:  atorvastatin   20 mg Oral QHS   carbidopa -levodopa   1 tablet Oral QID   insulin  aspart  0-5 Units Subcutaneous QHS   insulin  aspart  0-6 Units Subcutaneous TID WC   melatonin  10 mg Oral QHS   modafinil   200 mg Oral Daily   rivaroxaban   20 mg Oral Q supper   sodium chloride  flush  3 mL Intravenous Q12H   tamsulosin  0.4 mg Oral QHS   Continuous Infusions:  iron sucrose 400 mg (02/28/24 0932)    PRN Meds:.acetaminophen  **OR** acetaminophen , bisacodyl , ondansetron  **OR** ondansetron  (ZOFRAN ) IV, polyethylene glycol, QUEtiapine    I have personally reviewed following labs and imaging studies  LABORATORY DATA: CBC: Recent Labs   Lab 02/25/24 2144 02/26/24 0432 02/27/24 0327 02/28/24 0322  WBC 9.5 9.6 10.2 8.6  NEUTROABS 5.9  --   --   --   HGB 11.2* 10.8* 11.4* 10.6*  HCT 36.3* 34.7* 35.5* 33.1*  MCV 84.4 83.8 83.3 81.9  PLT 264 255 282 250    Basic Metabolic Panel: Recent Labs  Lab 02/25/24 2015 02/26/24 0432 02/27/24 0327 02/28/24 0322  NA 137 140 137 137  K 4.4 4.2 4.0 3.6  CL 104 106 103 100  CO2 23 24 23 25   GLUCOSE 132* 121* 121* 132*  BUN 17 16 18 20   CREATININE 0.93 0.87 1.08 1.06  CALCIUM  9.3 9.3 9.4 9.3  MG 1.6* 2.2  --   --   PHOS  --  3.3  --   --     GFR: Estimated Creatinine Clearance: 59.5 mL/min (by C-G formula based on SCr of 1.06 mg/dL).  Liver Function Tests: Recent Labs  Lab 02/25/24 2015  AST 21  ALT 11  ALKPHOS 48  BILITOT 0.3  PROT 6.7  ALBUMIN 3.3*   No results for input(s): "LIPASE", "AMYLASE" in the last 168 hours. Recent Labs  Lab 02/25/24 2015 02/26/24 0814  AMMONIA 42* <13    Coagulation Profile:  No results for input(s): "INR", "PROTIME" in the last 168 hours.  Cardiac Enzymes: No results for input(s): "CKTOTAL", "CKMB", "CKMBINDEX", "TROPONINI" in the last 168 hours.  BNP (last 3 results) No results for input(s): "PROBNP" in the last 8760 hours.  Lipid Profile: No results for input(s): "CHOL", "HDL", "LDLCALC", "TRIG", "CHOLHDL", "LDLDIRECT" in the last 72 hours.  Thyroid  Function Tests: Recent Labs    02/26/24 0432  TSH 0.796    Anemia Panel: Recent Labs    02/27/24 0327  VITAMINB12 592  FOLATE 39.5  FERRITIN 20*  TIBC 393  IRON 40*  RETICCTPCT 2.2    Urine analysis:    Component Value Date/Time   COLORURINE AMBER (A) 02/25/2024 2206   APPEARANCEUR HAZY (A) 02/25/2024 2206   LABSPEC 1.015 02/25/2024 2206   PHURINE 6.0 02/25/2024 2206   GLUCOSEU NEGATIVE 02/25/2024 2206   HGBUR LARGE (A) 02/25/2024 2206   BILIRUBINUR NEGATIVE 02/25/2024 2206   KETONESUR 5 (A) 02/25/2024 2206   PROTEINUR 100 (A) 02/25/2024 2206    NITRITE NEGATIVE 02/25/2024 2206   LEUKOCYTESUR NEGATIVE 02/25/2024 2206    Sepsis Labs: Lactic Acid, Venous    Component Value Date/Time   LATICACIDVEN 1.0 01/01/2022 0531    MICROBIOLOGY: Recent Results (from the past 240 hours)  Culture, blood (Routine X 2) w Reflex to ID Panel     Status: None (Preliminary result)   Collection Time: 02/26/24 11:28 AM   Specimen: BLOOD LEFT HAND  Result Value Ref Range Status   Specimen Description BLOOD LEFT HAND  Final   Special Requests   Final    BOTTLES DRAWN AEROBIC AND ANAEROBIC Blood Culture adequate volume   Culture   Final    NO GROWTH 2 DAYS Performed at [facility], [address], [place], [place] [address]    Report Status PENDING  Incomplete  Culture, blood (Routine X 2) w Reflex to ID Panel     Status: None (Preliminary result)   Collection Time: 02/26/24 11:35 AM   Specimen: BLOOD RIGHT HAND  Result Value Ref Range Status   Specimen Description BLOOD RIGHT HAND  Final   Special Requests   Final    BOTTLES DRAWN AEROBIC AND ANAEROBIC Blood Culture adequate volume   Culture   Final    NO GROWTH 2 DAYS Performed at [facility], [address], [place], [place] [address]    Report Status PENDING  Incomplete    RADIOLOGY STUDIES/RESULTS: No results found.    LOS: 1 day   Kimberly Penna, MD  [facility]    To contact the attending provider between 7A-7P or the covering provider during after hours 7P-7A, please log into the web site [email or web address] and access using universal [place] password for that web site. If you do not have the password, please call the hospital operator.  02/28/2024, 11:14 AM

## 2024-02-28 NOTE — Progress Notes (Signed)
 Patient awake most of the shift, briefly falling asleep approximately 0200, for about 2 hours.Restless when awake, frequently throwing legs across the side rail of bed. Pt reports that he is looking for his wife.Easier to redirect this morning.

## 2024-02-29 DIAGNOSIS — G9340 Encephalopathy, unspecified: Secondary | ICD-10-CM | POA: Diagnosis not present

## 2024-02-29 DIAGNOSIS — E1169 Type 2 diabetes mellitus with other specified complication: Secondary | ICD-10-CM | POA: Diagnosis not present

## 2024-02-29 DIAGNOSIS — Z8673 Personal history of transient ischemic attack (TIA), and cerebral infarction without residual deficits: Secondary | ICD-10-CM | POA: Diagnosis not present

## 2024-02-29 DIAGNOSIS — Z86718 Personal history of other venous thrombosis and embolism: Secondary | ICD-10-CM | POA: Diagnosis not present

## 2024-02-29 LAB — GLUCOSE, CAPILLARY
Glucose-Capillary: 148 mg/dL — ABNORMAL HIGH (ref 70–99)
Glucose-Capillary: 141 mg/dL — ABNORMAL HIGH (ref 70–99)
Glucose-Capillary: 137 mg/dL — ABNORMAL HIGH (ref 70–99)
Glucose-Capillary: 194 mg/dL — ABNORMAL HIGH (ref 70–99)

## 2024-02-29 NOTE — Plan of Care (Signed)
  Problem: Coping: Goal: Ability to adjust to condition or change in health will improve Outcome: Progressing   Problem: Health Behavior/Discharge Planning: Goal: Ability to manage health-related needs will improve Outcome: Not Progressing   Problem: Health Behavior/Discharge Planning: Goal: Ability to manage health-related needs will improve Outcome: Not Progressing

## 2024-02-29 NOTE — Consult Note (Signed)
 Urology Consult Note   Requesting Attending Physician:  Burton Casey, MD Service Providing Consult: Urology  Consulting Attending: Dr. Claretta Croft   Reason for Consult:    HPI: Jordan [name] is seen in consultation for reasons noted above at the request of Burton Casey, MD. patient is an 83 year old male with PMH significant for Parkinson's, VTE on Xarelto , T2DM, OSA on CPAP, and metastatic neuroendocrine tumor presenting to [facility] with fatigue, confusion, and loss of appetite.  He is preferentially established with [facility] on the basis of his insurance.  They have been following his hematuria and his nonobstructive renal stones.  [facility] was contacted by hospitalist because rehabilitation facility would not accept patient without a formal consult from urology to restate that there is no surgical indication at this time.  On arrival patient was sleeping heavily but was eventually awoken.  We reviewed case and plan.  The most recent CT scan is 02/02/2024 and while not ideally up-to-date, there is no sign of obstructive uropathy or decline of renal function.  There is no indication to eject him to more radiation.  Patient was amenable to the plan.  ------------------  Assessment:  83 y.o. male with renal stones, BPH, hematuria   Recommendations: #Renal stones #hematuria Follow with [facility] as planned.  His nonobstructive stones do not require surgical intervention, particularly in the context of his more pressing medical needs.  His renal function is preserved.  Hematuria is mild and largely representative of old blood.  As long as patient requires Xarelto  I expect he will continue to bleed.  Hemoglobin is stable at this time.  Urology will not need to follow.  Please feel free to contact us  with questions or development of acute urologic needs.  Case and plan discussed with Dr. Claretta Croft  Past Medical History: Past Medical History:   Diagnosis Date   Arthritis    R shoulder, bone spur   Arthritis    "hands" (03/25/2016)   Chronic diastolic heart failure (HCC)    Chronic lower back pain    Coronary heart disease    Dr Ollie Bhat   Diverticulitis 12/2021   DVT (deep venous thrombosis) (HCC) 05/2022   left leg   H/O cardiovascular stress test    perhaps last one was 2009   Heart murmur    12/29/11 echo: mild MR, no AS, trivial TR   History of gout    Hyperlipidemia    Hypertension    saw Dr. Pauletta Boroughs last earl;y- 2014, cardiac cath. last done ?2009, blocks seen didn't require any intervention at that point.   Narcolepsy    [name] 04-11-97; Mean Latency 1.87min, SOREM 2   OSA on CPAP    NPSG 12-13-98 AHI 22.7   PONV (postoperative nausea and vomiting)    Presence of permanent cardiac pacemaker    Shortness of breath    with exertion    Stroke (HCC)    Type II diabetes mellitus (HCC)     Past Surgical History:  Past Surgical History:  Procedure Laterality Date   CARDIAC CATHETERIZATION  2009?   EP IMPLANTABLE DEVICE N/A 03/25/2016   Procedure: Pacemaker Implant - Dual Chamber;  Surgeon: Tammie Fall, MD;  Location: [facility] INVASIVE CV LAB;  Service: Cardiovascular;  Laterality: N/A;   FRACTURE SURGERY     HIP FRACTURE SURGERY     INSERT / REPLACE / REMOVE PACEMAKER  03/24/2016   INTRAMEDULLARY (IM) NAIL INTERTROCHANTERIC Left 02/08/2022  Procedure: INTRAMEDULLARY (IM) NAIL INTERTROCHANTRIC;  Surgeon: Jasmine Mesi, MD;  Location: [facility] OR;  Service: Orthopedics;  Laterality: Left;   IR KYPHO LUMBAR INC FX REDUCE BONE BX UNI/BIL CANNULATION INC/IMAGING  12/29/2021   IR RADIOLOGIST EVAL & MGMT  12/15/2021   IR RADIOLOGY PERIPHERAL GUIDED IV START  07/14/2022   IR US  GUIDE VASC ACCESS RIGHT  07/15/2022   LEFT AND RIGHT HEART CATHETERIZATION WITH CORONARY ANGIOGRAM N/A 06/05/2014   Procedure: LEFT AND RIGHT HEART CATHETERIZATION WITH CORONARY ANGIOGRAM;  Surgeon: Mickiel Albany, MD;  Location: [facility] CATH LAB;   Service: Cardiovascular;  Laterality: N/A;   NASAL FRACTURE SURGERY     SHOULDER ARTHROSCOPY WITH ROTATOR CUFF REPAIR AND SUBACROMIAL DECOMPRESSION Right 08/16/2013   Procedure: SHOULDER ARTHROSCOPY WITH ROTATOR CUFF REPAIR AND SUBACROMIAL DECOMPRESSION;  Surgeon: Derald Flattery, MD;  Location: [facility] OR;  Service: Orthopedics;  Laterality: Right;  Right shoulder arthroscopy rotator cuff repair, subacromial decompression.   TONSILLECTOMY      Medication: Current Facility-Administered Medications  Medication Dose Route Frequency Provider Last Rate Last Admin   acetaminophen  (TYLENOL ) tablet 650 mg  650 mg Oral Q6H PRN Opyd, Timothy S, MD   650 mg at 02/26/24 2144   Or   acetaminophen  (TYLENOL ) suppository 650 mg  650 mg Rectal Q6H PRN Opyd, Timothy S, MD       atorvastatin  (LIPITOR) tablet 20 mg  20 mg Oral QHS Opyd, Timothy S, MD   20 mg at 02/28/24 2148   bisacodyl  (DULCOLAX) EC tablet 5 mg  5 mg Oral Daily PRN Opyd, Timothy S, MD       carbidopa -levodopa  (SINEMET  CR) 50-200 MG per tablet controlled release 1 tablet  1 tablet Oral QID Opyd, Timothy S, MD   1 tablet at 02/29/24 0802   insulin  aspart (novoLOG ) injection 0-5 Units  0-5 Units Subcutaneous QHS Opyd, Timothy S, MD       insulin  aspart (novoLOG ) injection 0-6 Units  0-6 Units Subcutaneous TID WC Opyd, Timothy S, MD   1 Units at 02/28/24 1801   melatonin tablet 10 mg  10 mg Oral QHS Opyd, Timothy S, MD   10 mg at 02/28/24 2148   modafinil  (PROVIGIL ) tablet 200 mg  200 mg Oral Daily Opyd, Timothy S, MD   200 mg at 02/29/24 0802   ondansetron  (ZOFRAN ) tablet 4 mg  4 mg Oral Q6H PRN Opyd, Timothy S, MD       Or   ondansetron  (ZOFRAN ) injection 4 mg  4 mg Intravenous Q6H PRN Opyd, Timothy S, MD       polyethylene glycol (MIRALAX  / GLYCOLAX ) packet 17 g  17 g Oral Daily PRN Opyd, Timothy S, MD       QUEtiapine  (SEROQUEL ) tablet 25 mg  25 mg Oral QPM Rella Cardinal, FNP   25 mg at 02/28/24 1800   rivaroxaban  (XARELTO ) tablet  20 mg  20 mg Oral Q supper Opyd, Timothy S, MD   20 mg at 02/28/24 1800   sodium chloride  flush (NS) 0.9 % injection 3 mL  3 mL Intravenous Q12H Opyd, Timothy S, MD   3 mL at 02/29/24 0801   tamsulosin (FLOMAX) capsule 0.4 mg  0.4 mg Oral QHS Opyd, Timothy S, MD   0.4 mg at 02/28/24 2148    Allergies: Allergies  Allergen Reactions   Lisinopril      Unknown reaction   Amantadines Other (See Comments)    Mental changes   Clonazepam  Other (See Comments)  Mental changes   Zoloft  [Sertraline ] Other (See Comments)    Mental changes    Social History: Social History   Tobacco Use   Smoking status: Former    Current packs/day: 0.00    Average packs/day: 2.5 packs/day for 6.0 years (15.0 ttl pk-yrs)    Types: Cigarettes    Start date: 06/04/1961    Quit date: 06/05/1967    Years since quitting: 56.7   Smokeless tobacco: Never  Vaping Use   Vaping status: Never Used  Substance Use Topics   Alcohol  use: Yes    Comment: rare   Drug use: No    Family History Family History  Problem Relation Age of Onset   Sleep apnea Sister    Colon cancer Sister     Review of Systems  Genitourinary:  Positive for hematuria. Negative for dysuria, flank pain, frequency and urgency.     Objective   Vital signs in last 24 hours: BP 111/89 (BP Location: Left Arm)   Pulse 66   Temp 98.6 F (37 C)   Resp (!) 22   Ht 5\' 9"  (1.753 m)   Wt 96.8 kg   SpO2 90%   BMI 31.50 kg/m   Physical Exam General: A&O, resting, appropriate HEENT: [place]/AT Pulmonary: Normal work of breathing Cardiovascular: no cyanosis Abdomen: Soft, NTTP, nondistended GU: Mild hematuria, coffee colored, representative of old blood.  Presumably from prostate. Neuro: Appropriate, no focal neurological deficits  Most Recent Labs: Lab Results  Component Value Date   WBC 8.6 02/28/2024   HGB 10.6 (L) 02/28/2024   HCT 33.1 (L) 02/28/2024   PLT 250 02/28/2024    Lab Results  Component Value Date   NA 137 02/28/2024    K 3.6 02/28/2024   CL 100 02/28/2024   CO2 25 02/28/2024   BUN 20 02/28/2024   CREATININE 1.06 02/28/2024   CALCIUM  9.3 02/28/2024   MG 2.2 02/26/2024   PHOS 3.3 02/26/2024    Lab Results  Component Value Date   INR 1.4 (H) 12/22/2023   APTT 32 12/22/2023     Urine Culture: @[id number] (laburin,org,[id number],[id number])@   IMAGING: No results found.  ------  Jordan Ar, NP Pager: [phone]   Please contact the urology consult pager with any further questions/concerns.

## 2024-02-29 NOTE — TOC Progression Note (Signed)
 Transition of Care [facility]) - Progression Note    Patient Details  Name: Jordan [name] MRN: [id number] Date of Birth: 28-Apr-1941  [facility] CM/SW Contact  Jannice Mends, LCSW Phone Number: 02/29/2024, 10:39 AM  Clinical Narrative:    [facility] MD (Dr. Carry Clapper [phone]) has spoken to patient's Urologist [phone]) who is recommending patient receive a stent prior to going to Inpatient rehab. CSW provided info to MD who will request a Urology consult.    Expected Discharge Plan: Skilled Nursing Facility Barriers to Discharge: Continued Medical Work up, English as a second language teacher, SNF Pending bed offer  Expected Discharge Plan and Services In-house Referral: Clinical Social Work   Post Acute Care Choice: IP Rehab Living arrangements for the past 2 months: Single Family Home                                       Social Determinants of Health (SDOH) Interventions SDOH Screenings   Food Insecurity: No Food Insecurity (02/26/2024)  Housing: Unknown (02/26/2024)  Transportation Needs: No Transportation Needs (02/26/2024)  Utilities: Not At Risk (02/26/2024)  Depression (PHQ2-9): Low Risk  (04/12/2023)  Social Connections: Moderately Integrated (02/26/2024)  Tobacco Use: Medium Risk (02/25/2024)    Readmission Risk Interventions     No data to display

## 2024-02-29 NOTE — Progress Notes (Signed)
 Pt slept calmly throughout the night. Brief episodes of restlessness, however slept approximately 8 hours this shift. Took po medications without difficulty.

## 2024-02-29 NOTE — Progress Notes (Addendum)
 PROGRESS NOTE        PATIENT DETAILS Name: Jordan [name] Age: 83 y.o. Sex: male Date of Birth: [birth year] Admit Date: 02/25/2024 Admitting Physician Tylene Galla Donzell Gallery, MD [id number], Amiel Kalata, MD  Brief Summary: Patient is a 83 y.o.  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  Significant events: 4/26>> admit to [facility]  Significant studies: 4/26>> CXR: No PNA 4/26>> CT head: No acute abnormality 4/26>> UA: 0-5 WBC. 4/27>> TSH: Normal 4/27>>NH4: normal 4/28>> Spot EEG: No seizures 4/28>> ABG: No hypercarbia-normal pH.  Significant microbiology data: 4/27>> blood culture-no growth.  Procedures: None  Consults: Neurology Psych Urology  Subjective: Sleepy this morning but no major issues overnight.  Objective: Vitals: Blood pressure 111/89, pulse 66, temperature 98.6 F (37 C), resp. rate (!) 22, height 5\' 9"  (1.753 m), weight 96.8 kg, SpO2 90%.   Exam: Gen Exam: Awakes easily-answer simple questions appropriately. HEENT:atraumatic, normocephalic Chest: B/L clear to auscultation anteriorly CVS:S1S2 regular Abdomen:soft non tender, non distended Extremities:no edema Neurology: Non focal-but with generalized weakness. Skin: no rash  Pertinent Labs/Radiology:    Latest Ref Rng & Units 02/28/2024    3:22 AM 02/27/2024    3:27 AM 02/26/2024    4:32 AM  CBC  WBC 4.0 - 10.5 K/uL 8.6  10.2  9.6   Hemoglobin 13.0 - 17.0 g/dL 40.9  81.1  91.4   Hematocrit 39.0 - 52.0 % 33.1  35.5  34.7   Platelets 150 - 400 K/uL 250  282  255     Lab Results  Component Value Date   NA 137 02/28/2024   K 3.6 02/28/2024   CL 100 02/28/2024   CO2 25 02/28/2024      Assessment/Plan: Acute metabolic encephalopathy Failure to thrive syndrome Multifactorial etiology-in the setting of Seroquel  use/Parkinson's disease/known cognitive  issues. PT/OT/SLP eval-[facility]. Relatively awake/alert this morning. Seroquel  initially discontinued but had hallucinations/agitation-psychiatry consulted on 4/29-Seroquel  restarted but timing changed from nightly to early evening.  History of Parkinson's disease Continue Sinemet   History of visual hallucinations Likely due to Parkinson's disease related cognitive issues. Seroquel  stopped on 4/27 but then patient started having hallucinations/agitation-after discussion with neurology on 4/29-psychiatry consulted-Seroquel  restarted but timing changed from nightly to early evening. No major issues overnight.  History of daytime somnolence Apparently worse for the past several days prior to this hospitalization. On modafinil   History of orthostatic hypotension related to Parkinson's disease History of worsening and debility/weakness Previously able to use a walker and ambulate around the house-now barely able to ambulate. See above plans-regarding workup-possible neurology evaluation at some point Continue to work with PT/OT.  Social work following for rehab placement.  History of CVA Nonfocal exam Not on antiplatelets as an anticoagulation Continue statin.  History of chronic HFpEF Euvolemic  DM-2 CBG stable-SSI Recent Labs    02/28/24 1724 02/28/24 2123 02/29/24 0842  GLUCAP 176* 173* 148*     History of VTE Xarelto   History of PPM implantation  Recent history of Kidney stones Follow with Urology at [facility]-ureteroscopy planned-but postponed 2/2 to patient fatigue/weakness. Thankfully renal function is stable-no evidence of UTI Initial plan was to follow-up with his urologist postdischarge-however per social work-acute rehab is requesting a urology consult for possible ureteroscopy with stent placement before they  can accept the patient.  I will touch base with urology later today.  Iron  patient's anemia Was supposed to start IV  iron  as an outpatient-discussed with pharmacy team-will be getting IV iron  here in the hospital.   Etiology is likely slow loss through GI or GU tract (stones/microscopic hematuria on UA).   Continue to follow CBC closely-follows with oncology/hematology in the outpatient setting.  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   Weight as of this encounter: 96.8 kg.   Agree with assessment and plan as outlined below. Pressure Injury 01/06/24 Coccyx Posterior Stage 1 -  Intact skin with non-blanchable redness of a localized area usually over a bony prominence. (Active)  01/06/24 1130  Location: Coccyx  Location Orientation: Posterior  Staging: Stage 1 -  Intact skin with non-blanchable redness of a localized area usually over a bony prominence.  Wound Description (Comments):   Present on Admission:       Code status:   Code Status: Full Code   DVT Prophylaxis: rivaroxaban  (XARELTO ) tablet 20 mg     Family Communication: Spouse-Kelly Nicholl-[phone]-updated over the phone on 4/30   Disposition Plan: Status is: Observation The patient will require care spanning > 2 midnights and should be moved to inpatient because: Severity of illness   Planned Discharge Destination:Inpatient Rehab   Diet: Diet Order             Diet regular Room service appropriate? Yes; Fluid consistency: Thin  Diet effective now                     Antimicrobial agents: Anti-infectives (From admission, onward)    None        MEDICATIONS: Scheduled Meds:  atorvastatin   20 mg Oral QHS   carbidopa -levodopa   1 tablet Oral QID   insulin  aspart  0-5 Units Subcutaneous QHS   insulin  aspart   0-6 Units Subcutaneous TID WC   melatonin  10 mg Oral QHS   modafinil   200 mg Oral Daily   QUEtiapine   25 mg Oral QPM   rivaroxaban   20 mg Oral Q supper   sodium chloride  flush  3 mL Intravenous Q12H   tamsulosin   0.4 mg Oral QHS   Continuous Infusions:  iron  sucrose 400 mg (02/28/24 0932)    PRN Meds:.acetaminophen  **OR** acetaminophen , bisacodyl , ondansetron  **OR** ondansetron  (ZOFRAN ) IV, polyethylene glycol   I have personally reviewed following labs and imaging studies  LABORATORY DATA: CBC: Recent Labs  Lab 02/25/24 2144 02/26/24 0432 02/27/24 0327 02/28/24 0322  WBC 9.5 9.6 10.2 8.6  NEUTROABS 5.9  --   --   --   HGB 11.2* 10.8* 11.4* 10.6*  HCT 36.3* 34.7* 35.5* 33.1*  MCV 84.4 83.8 83.3 81.9  PLT 264 255 282 250    Basic Metabolic Panel: Recent Labs  Lab 02/25/24 2015 02/26/24 0432 02/27/24 0327 02/28/24 0322  NA 137 140 137 137  K 4.4 4.2 4.0 3.6  CL 104 106 103 100  CO2 23 24 23 25   GLUCOSE 132* 121* 121* 132*  BUN 17 16 18 20   CREATININE 0.93 0.87 1.08 1.06  CALCIUM  9.3 9.3 9.4 9.3  MG 1.6* 2.2  --   --  PHOS  --  3.3  --   --     GFR: Estimated Creatinine Clearance: 59.5 mL/min (by C-G formula based on SCr of 1.06 mg/dL).  Liver Function Tests: Recent Labs  Lab 02/25/24 2015  AST 21  ALT 11  ALKPHOS 48  BILITOT 0.3  PROT 6.7  ALBUMIN 3.3*   No results for input(s): "LIPASE", "AMYLASE" in the last 168 hours. Recent Labs  Lab 02/25/24 2015 02/26/24 0814  AMMONIA 42* <13    Coagulation Profile: No results for input(s): "INR", "PROTIME" in the last 168 hours.  Cardiac Enzymes: No results for input(s): "CKTOTAL", "CKMB", "CKMBINDEX", "TROPONINI" in the last 168 hours.  BNP (last 3 results) No results for input(s): "PROBNP" in the last 8760 hours.  Lipid Profile: No results for input(s): "CHOL", "HDL", "LDLCALC", "TRIG", "CHOLHDL", "LDLDIRECT" in the last 72 hours.  Thyroid  Function Tests: No results for input(s): "TSH",  "T4TOTAL", "FREET4", "T3FREE", "THYROIDAB" in the last 72 hours.   Anemia Panel: Recent Labs    02/27/24 0327  VITAMINB12 592  FOLATE 39.5  FERRITIN 20*  TIBC 393  IRON  40*  RETICCTPCT 2.2    Urine analysis:    Component Value Date/Time   COLORURINE AMBER (A) 02/25/2024 2206   APPEARANCEUR HAZY (A) 02/25/2024 2206   LABSPEC 1.015 02/25/2024 2206   PHURINE 6.0 02/25/2024 2206   GLUCOSEU NEGATIVE 02/25/2024 2206   HGBUR LARGE (A) 02/25/2024 2206   BILIRUBINUR NEGATIVE 02/25/2024 2206   KETONESUR 5 (A) 02/25/2024 2206   PROTEINUR 100 (A) 02/25/2024 2206   NITRITE NEGATIVE 02/25/2024 2206   LEUKOCYTESUR NEGATIVE 02/25/2024 2206    Sepsis Labs: Lactic Acid, Venous    Component Value Date/Time   LATICACIDVEN 1.0 01/01/2022 0531    MICROBIOLOGY: Recent Results (from the past 240 hours)  Culture, blood (Routine X 2) w Reflex to ID Panel     Status: None (Preliminary result)   Collection Time: 02/26/24 11:28 AM   Specimen: BLOOD LEFT HAND  Result Value Ref Range Status   Specimen Description BLOOD LEFT HAND  Final   Special Requests   Final    BOTTLES DRAWN AEROBIC AND ANAEROBIC Blood Culture adequate volume   Culture   Final    NO GROWTH 3 DAYS Performed at [facility], [address] [place], [place] [address]    Report Status PENDING  Incomplete  Culture, blood (Routine X 2) w Reflex to ID Panel     Status: None (Preliminary result)   Collection Time: 02/26/24 11:35 AM   Specimen: BLOOD RIGHT HAND  Result Value Ref Range Status   Specimen Description BLOOD RIGHT HAND  Final   Special Requests   Final    BOTTLES DRAWN AEROBIC AND ANAEROBIC Blood Culture adequate volume   Culture   Final    NO GROWTH 3 DAYS Performed at [facility], [address] [place], [place] [address]    Report Status PENDING  Incomplete    RADIOLOGY STUDIES/RESULTS: No results found.    LOS: 2 days   Kimberly Penna, MD  [facility]    To contact the  attending provider between 7A-7P or the covering provider during after hours 7P-7A, please log into the web site [email or web address] and access using universal [place] password for that web site. If you do not have the password, please call the hospital operator.  02/29/2024, 11:22 AM

## 2024-03-01 DIAGNOSIS — Z8673 Personal history of transient ischemic attack (TIA), and cerebral infarction without residual deficits: Secondary | ICD-10-CM | POA: Diagnosis not present

## 2024-03-01 DIAGNOSIS — Z86718 Personal history of other venous thrombosis and embolism: Secondary | ICD-10-CM | POA: Diagnosis not present

## 2024-03-01 DIAGNOSIS — G9340 Encephalopathy, unspecified: Secondary | ICD-10-CM | POA: Diagnosis not present

## 2024-03-01 DIAGNOSIS — E1169 Type 2 diabetes mellitus with other specified complication: Secondary | ICD-10-CM | POA: Diagnosis not present

## 2024-03-01 LAB — GLUCOSE, CAPILLARY
Glucose-Capillary: 157 mg/dL — ABNORMAL HIGH (ref 70–99)
Glucose-Capillary: 229 mg/dL — ABNORMAL HIGH (ref 70–99)
Glucose-Capillary: 192 mg/dL — ABNORMAL HIGH (ref 70–99)
Glucose-Capillary: 204 mg/dL — ABNORMAL HIGH (ref 70–99)
Glucose-Capillary: 155 mg/dL — ABNORMAL HIGH (ref 70–99)

## 2024-03-01 MED ORDER — MIDODRINE HCL 5 MG PO TABS
5.0000 mg | ORAL_TABLET | Freq: Two times a day (BID) | ORAL | Status: DC
  Administered 2024-03-01 – 2024-03-02 (×2): 5 mg via ORAL
  Filled 2024-03-01 (×2): qty 1

## 2024-03-01 NOTE — Progress Notes (Signed)
 Physical Therapy Treatment Patient Details Name: Jordan [name] MRN: [id number] DOB: 10-20-1941 Today's Date: 03/01/2024   History of Present Illness Pt is 83 yo presenting to [facility] ED on 02/25/24 with increased fatigue, confusion, and loss of appetite.  PMHx: Parkinsons, HLD, OSA, DM, HTN, chronic diastolic HF, CVA, frequent falls, L2 compression fx, L hip fx    PT Comments  Improved mobility with more stable BP today. Compression stockings (knee high) were donned during session (see details below.) Pt able to mobilize in bed with min assist, transferred to [facility] with up to mod assist, but stood from [facility] with min assist, heavy multimodal cues for sequencing and technique. Unsuccessful attempt at [facility] despite feeling of urgency, passing gas though. An RN came to room to assess for thigh high compression stockings which were originally recommended and may be more beneficial that knee high. Patient will continue to benefit from skilled physical therapy services to further improve independence with functional mobility.   Supine BP 135/78 HR 72; Seated BP 114/67 HR 72. Seated BP on BSC after transfer 101/75 HR 68. SpO2 92% on RA. Could not tolerated standing long enough for BP.    If plan is discharge home, recommend the following: Two people to help with walking and/or transfers;A lot of help with bathing/dressing/bathroom;Assistance with feeding;Assistance with cooking/housework;Assist for transportation;Supervision due to cognitive status   Can travel by private vehicle     No (Possibly soon.)  Equipment Recommendations  None recommended by PT    Recommendations for Other Services       Precautions / Restrictions Precautions Precautions: Fall Recall of Precautions/Restrictions: Impaired Restrictions Weight Bearing Restrictions Per Provider Order: No     Mobility  Bed Mobility Overal bed mobility: Needs Assistance Bed Mobility: Supine to Sit, Sit to Supine     Supine to sit: Min  assist Sit to supine: Mod assist   General bed mobility comments: Min assist for trunk support to rise and cues to bring LEs off bed. Mod assist for trunk and LEs to sequence and raise back into bed.    Transfers Overall transfer level: Needs assistance Equipment used: Rolling walker (2 wheels) Transfers: Sit to/from Stand, Bed to chair/wheelchair/BSC Sit to Stand: Mod assist Stand pivot transfers: Mod assist         General transfer comment: Mod assist for boost to stand from bed, min assist to rise from [facility] with multimodal cues for hand placement and technique. Mod assist for step pivot transfer to assist with sequencing, RW placement, and posterior lean. Able to shuffle feet to [facility] and back to bed.    Ambulation/Gait                   Stairs             Wheelchair Mobility     Tilt Bed    Modified Rankin (Stroke Patients Only)       Balance Overall balance assessment: History of Falls, Needs assistance Sitting-balance support: Feet supported, Bilateral upper extremity supported Sitting balance-Leahy Scale: Poor Sitting balance - Comments: CGA for safety Postural control: Right lateral lean Standing balance support: Bilateral upper extremity supported, During functional activity Standing balance-Leahy Scale: Poor Standing balance comment: reliant on therapist for support                            Communication Communication Communication: Impaired Factors Affecting Communication: Hearing impaired  Cognition Arousal: Alert Behavior During Therapy:  WFL for tasks assessed/performed   PT - Cognitive impairments: Memory, Attention, Sequencing, Problem solving, Safety/Judgement, Awareness, Initiation                       PT - Cognition Comments: Tangential, reduced short term memory, easily distracted but redirected easily. Following commands: Impaired Following commands impaired: Follows one step commands inconsistently,  Follows one step commands with increased time    Cueing Cueing Techniques: Verbal cues, Gestural cues, Tactile cues, Visual cues  Exercises      General Comments General comments (skin integrity, edema, etc.): Supine BP 135/78 HR 72; Seated BP 114/67 HR 72. Seated BP on BSC after transfer 101/75 HR 68. SpO2 92% on RA. Could not tolerated standing long enough for BP.      Pertinent Vitals/Pain Pain Assessment Pain Assessment: No/denies pain    Home Living                          Prior Function            PT Goals (current goals can now be found in the care plan section) Acute Rehab PT Goals Patient Stated Goal: none stated PT Goal Formulation: Patient unable to participate in goal setting Time For Goal Achievement: 03/11/24 Potential to Achieve Goals: Fair Progress towards PT goals: Progressing toward goals    Frequency    Min 2X/week      PT Plan      Co-evaluation              AM-PAC PT "6 Clicks" Mobility   Outcome Measure  Help needed turning from your back to your side while in a flat bed without using bedrails?: A Little Help needed moving from lying on your back to sitting on the side of a flat bed without using bedrails?: A Little Help needed moving to and from a bed to a chair (including a wheelchair)?: A Lot Help needed standing up from a chair using your arms (e.g., wheelchair or bedside chair)?: A Lot Help needed to walk in hospital room?: Total Help needed climbing 3-5 steps with a railing? : Total 6 Click Score: 12    End of Session Equipment Utilized During Treatment: Gait belt Activity Tolerance: Patient tolerated treatment well Patient left: in bed;with call bell/phone within reach;with bed alarm set (fall mat in place) Nurse Communication: Mobility status PT Visit Diagnosis: History of falling (Z91.81);Muscle weakness (generalized) (M62.81);Difficulty in walking, not elsewhere classified (R26.2);Unsteadiness on feet  (R26.81);Dizziness and giddiness (R42)     Time: 4098-1191 PT Time Calculation (min) (ACUTE ONLY): 27 min  Charges:    $Therapeutic Activity: 23-37 mins PT General Charges $$ ACUTE PT VISIT: 1 Visit                     Jory Ng, PT, DPT [facility]  Rehabilitation Services Physical Therapist Office: [phone] Website: [email or web address]    Alinda Irani 03/01/2024, 5:10 PM

## 2024-03-01 NOTE — Progress Notes (Cosign Needed)
 Occupational Therapy Treatment Patient Details Name: Jordan [name] MRN: [id number] DOB: [birth year] Today's Date: 03/01/2024   History of present illness Pt is 83 yo presenting to [facility] ED on 02/25/24 with increased fatigue, confusion, and loss of appetite.  PMHx: Parkinsons, HLD, OSA, DM, HTN, chronic diastolic HF, CVA, frequent falls, L2 compression fx, L hip fx   OT comments  Patient received in supine and asking to use BSC. Patient's BP in supine 124/71 (88). Patient was min assist to get to EOB with BP 91/52 (63). Patient with difficulty standing with walker and performed face to face transfer to [facility] with max assist. Once on BSC patient's BP was 128/93 (97). Patient unable to have BM with BP 88/60 (69) following 5 minutes. Patient transferred back to EOB and performed grooming with BP 71/44 (54) and patient assisted back to supine. In supine patient was 91/79 (85) and increased to 127/79 (79) following 2 minutes. Patient with no complaints of dizziness or light headiness during session. Patient will benefit from intensive inpatient follow-up therapy, >3 hours/day to continue to address functional transfers and self care. Acute OT to continue to follow to address established goals to facilitate DC to next venue of care.       If plan is discharge home, recommend the following:  A lot of help with walking and/or transfers;Two people to help with walking and/or transfers;Two people to help with bathing/dressing/bathroom;A lot of help with bathing/dressing/bathroom   Equipment Recommendations  None recommended by OT    Recommendations for Other Services      Precautions / Restrictions Precautions Precautions: Fall Recall of Precautions/Restrictions: Impaired       Mobility Bed Mobility Overal bed mobility: Needs Assistance Bed Mobility: Supine to Sit, Sit to Supine     Supine to sit: Min assist Sit to supine: Mod assist   General bed mobility comments: assistance with trunk to get to  EOB and assistance with BLE and trunk to return to supine    Transfers Overall transfer level: Needs assistance Equipment used: None Transfers: Sit to/from Stand, Bed to chair/wheelchair/BSC Sit to Stand: Max assist Stand pivot transfers: Max assist         General transfer comment: max assist for stand pivot transfer to [facility] from EOB with face to face technique due to patient having difficulty with power up and limited standing tolerance     Balance Overall balance assessment: History of Falls, Needs assistance Sitting-balance support: Feet supported, Bilateral upper extremity supported Sitting balance-Leahy Scale: Poor Sitting balance - Comments: CGA for safety   Standing balance support: Bilateral upper extremity supported, During functional activity Standing balance-Leahy Scale: Poor Standing balance comment: reliant on therapist for support                           ADL either performed or assessed with clinical judgement   ADL Overall ADL's : Needs assistance/impaired     Grooming: Wash/dry hands;Wash/dry face;Oral care;Minimal assistance;Sitting Grooming Details (indicate cue type and reason): assistance to load toothbrush                 Toilet Transfer: Maximal assistance;Stand-pivot;BSC/3in1 Toilet Transfer Details (indicate cue type and reason): from EOB to [facility] and back with max assist with face to face technique Toileting- Clothing Manipulation and Hygiene: Total assistance         General ADL Comments: seen for toilet transfers and self care at bed level due to orthostatics  Extremity/Trunk Assessment Upper Extremity Assessment Upper Extremity Assessment: Generalized weakness            Vision       Perception     Praxis     Communication Communication Communication: No apparent difficulties   Cognition Arousal: Alert Behavior During Therapy: WFL for tasks assessed/performed Cognition: No family/caregiver present to  determine baseline             OT - Cognition Comments: able to give correct month and year, unaware of situation or place                 Following commands: Impaired Following commands impaired: Follows one step commands inconsistently, Follows one step commands with increased time      Cueing   Cueing Techniques: Verbal cues, Gestural cues, Tactile cues, Visual cues  Exercises      Shoulder Instructions       General Comments BP in supine 124/71 (88), seated on EOB 91/52 (63), seated on BSC following transfer 128/93 (97), following 5 minutes on BSC 88/60 (69), back to EOB 71/44 (54), back in supine 91/79 (85), after 2 minutes in supine 127/79 (79)    Pertinent Vitals/ Pain       Pain Assessment Pain Assessment: Faces Faces Pain Scale: Hurts a little bit Pain Location: scrotum Pain Descriptors / Indicators: Discomfort, Grimacing Pain Intervention(s): Limited activity within patient's tolerance, Monitored during session, Repositioned  Home Living                                          Prior Functioning/Environment              Frequency  Min 2X/week        Progress Toward Goals  OT Goals(current goals can now be found in the care plan section)  Progress towards OT goals: Progressing toward goals  Acute Rehab OT Goals Patient Stated Goal: to have a BM OT Goal Formulation: With patient Time For Goal Achievement: 03/12/24 Potential to Achieve Goals: Good ADL Goals Pt Will Perform Grooming: with supervision;standing Pt Will Transfer to Toilet: with min assist;ambulating Pt Will Perform Toileting - Clothing Manipulation and hygiene: with min assist;sit to/from stand;sitting/lateral leans Additional ADL Goal #1: Pt to maintain sitting balance EOB > 5 min with no more than CGA during ADLs/functional tasks  Plan      Co-evaluation                 AM-PAC OT "6 Clicks" Daily Activity     Outcome Measure   Help from  another person eating meals?: A Little Help from another person taking care of personal grooming?: A Lot Help from another person toileting, which includes using toliet, bedpan, or urinal?: A Lot Help from another person bathing (including washing, rinsing, drying)?: A Lot Help from another person to put on and taking off regular upper body clothing?: A Lot Help from another person to put on and taking off regular lower body clothing?: A Lot 6 Click Score: 13    End of Session Equipment Utilized During Treatment: Gait belt  OT Visit Diagnosis: Unsteadiness on feet (R26.81);Other abnormalities of gait and mobility (R26.89);Muscle weakness (generalized) (M62.81)   Activity Tolerance Patient tolerated treatment well   Patient Left in bed;with call bell/phone within reach;with bed alarm set   Nurse Communication Mobility status  Time: 0981-1914 OT Time Calculation (min): 29 min  Charges: OT General Charges $OT Visit: 1 Visit OT Treatments $Self Care/Home Management : 23-37 mins  Anitra Barn, OTA Acute Rehabilitation Services  Office [phone]   Jovita Nipper 03/01/2024, 10:20 AM

## 2024-03-01 NOTE — Plan of Care (Signed)
  Problem: Clinical Measurements: Goal: Ability to maintain clinical measurements within normal limits will improve Outcome: Progressing Goal: Will remain free from infection Outcome: Progressing Goal: Respiratory complications will improve Outcome: Progressing   Problem: Coping: Goal: Level of anxiety will decrease Outcome: Progressing   Problem: Safety: Goal: Ability to remain free from injury will improve Outcome: Progressing   Problem: Skin Integrity: Goal: Risk for impaired skin integrity will decrease Outcome: Progressing

## 2024-03-01 NOTE — Progress Notes (Addendum)
 PROGRESS NOTE        PATIENT DETAILS Name: Jordan [name] Age: 83 y.o. Sex: male Date of Birth: 1940-12-31 Admit Date: 02/25/2024 Admitting Physician Tylene Galla Donzell Gallery, MD WUJ:[id number], Amiel Kalata, MD  Brief Summary: Patient is a 83 y.o.  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  Significant events: 4/26>> admit to [facility]  Significant studies: 4/26>> CXR: No PNA 4/26>> CT head: No acute abnormality 4/26>> UA: 0-5 WBC. 4/27>> TSH: Normal 4/27>>NH4: normal 4/28>> Spot EEG: No seizures 4/28>> ABG: No hypercarbia-normal pH.  Significant microbiology data: 4/27>> blood culture-no growth.  Procedures: None  Consults: Neurology Psych Urology  Subjective: Completely awake/alert-eating breakfast this morning.  No major issues overnight.  Objective: Vitals: Blood pressure 123/84, pulse 67, temperature 97.6 F (36.4 C), temperature source Axillary, resp. rate 20, height 5\' 9"  (1.753 m), weight 96.8 kg, SpO2 91%.   Exam: Awake/alert-answering simple questions appropriately Chest: Clear to auscultation Abdomen: Soft nontender nondistended Extremities: No edema Neurology: Nonfocal-but with generalized weakness.  Pertinent Labs/Radiology:    Latest Ref Rng & Units 02/28/2024    3:22 AM 02/27/2024    3:27 AM 02/26/2024    4:32 AM  CBC  WBC 4.0 - 10.5 K/uL 8.6  10.2  9.6   Hemoglobin 13.0 - 17.0 g/dL 78.2  95.6  21.3   Hematocrit 39.0 - 52.0 % 33.1  35.5  34.7   Platelets 150 - 400 K/uL 250  282  255     Lab Results  Component Value Date   NA 137 02/28/2024   K 3.6 02/28/2024   CL 100 02/28/2024   CO2 25 02/28/2024     Assessment/Plan: Acute metabolic encephalopathy Failure to thrive syndrome Multifactorial etiology-in the setting of Seroquel  use/Parkinson's disease/known cognitive issues. PT/OT/SLP eval-acute  [facility]. Overall improved-with supportive care-Seroquel  was initially discontinued but redeveloped hallucinations/agitation-after discussion with neurology-psychiatry was consulted-Seroquel  was restarted but timing changed to 5 PM -which patient seems to be tolerating well at this point without over somnolence.  History of Parkinson's disease Continue Sinemet -Per my discussion with Dr. Lindzen several days ago-no need to change dosage at this point.  Further dosage adjustment deferred to outpatient neurology.  History of visual hallucinations Likely due to Parkinson's disease related cognitive issue and possible superimposed hospital delirium See above discussion regarding Seroquel -seems to be tolerating Seroquel  at 5 PM while as noted above.  History of daytime somnolence Apparently worse for the past several days prior to this hospitalization. On modafinil   History of orthostatic hypotension related to Parkinson's disease History of worsening and debility/weakness Previously able to use a walker and ambulate around the house-now barely able to ambulate. Continue to work with PT/OT.  Social work following for rehab placement. Blood pressure is somewhat more acceptable today-will start low-dose midodrine -apply TED stockings and see how he does. Cautiously continuing with Flomax  as he has ureteral stones that hopefully will pass with supportive care.  History of CVA Nonfocal exam Not on antiplatelets as an anticoagulation Continue statin.  History of chronic HFpEF Euvolemic  DM-2 CBG stable-SSI Recent Labs    02/29/24 1551 02/29/24 2041 03/01/24 0824  GLUCAP 137* 194* 157*     History of VTE Xarelto   History of PPM implantation  Recent history of Kidney stones Follow with  Urology at [facility]-ureteroscopy planned-but postponed 2/2 to patient fatigue/weakness. Thankfully renal function is stable-no evidence of UTI Appreciate urology  input-recommendations are to continue supportive care-and follow-up with primary urology as an outpatient. Continue Flomax   Iron  deficiency anemia Was supposed to get IV iron  as an outpatient-was in the process of setting this up. S/p IV iron  x 2 in the hospital-further iron  infusions will be deferred to the outpatient setting.  Etiology is likely slow loss through GI or GU tract (stones/microscopic hematuria on UA).   Continue to follow CBC closely-follows with oncology/hematology in the outpatient setting.  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   Weight as of this encounter: 96.8 kg.   Agree with assessment and plan as outlined below. Pressure Injury 01/06/24 Coccyx Posterior Stage 1 -  Intact skin with non-blanchable redness of a localized area usually over a bony prominence. (Active)  01/06/24 1130  Location: Coccyx  Location Orientation: Posterior  Staging: Stage 1 -  Intact skin with non-blanchable redness of a localized area usually over a bony prominence.  Wound Description (Comments):   Present on Admission:      Code status:   Code Status: Full Code   DVT Prophylaxis: rivaroxaban  (XARELTO ) tablet 20 mg     Family Communication: Spouse-Kelly [phone]-updated over the phone on 5/1-I have again discussed with her in great detail regarding medication changes that she has been requesting for the past several days.  Given his confounding issues with possible delirium/kidney stones-chronicity of his problems-I have urged her to wait till patient is a bit more stable-back in his familiar surroundings before making significant changes to his medications (and she was  inquiring about changing formulation of Sinemet , stopping Flomax  etc.)   Disposition Plan: Status is: Observation The patient will require care spanning > 2 midnights and should be moved to inpatient because: Severity of illness   Planned Discharge Destination:Inpatient Rehab-medically stable for discharge if bed available.   Diet: Diet Order             Diet regular Room service appropriate? Yes; Fluid consistency: Thin  Diet effective now                     Antimicrobial agents: Anti-infectives (From admission, onward)    None        MEDICATIONS: Scheduled Meds:  atorvastatin   20 mg Oral QHS   carbidopa -levodopa   1 tablet Oral QID   insulin  aspart  0-5 Units Subcutaneous QHS   insulin  aspart  0-6 Units Subcutaneous TID WC   melatonin  10 mg Oral QHS   midodrine   5 mg Oral BID WC   modafinil   200 mg Oral Daily   QUEtiapine   25 mg Oral QPM   rivaroxaban   20 mg Oral Q supper   sodium chloride  flush  3 mL Intravenous Q12H   tamsulosin   0.4 mg Oral QHS   Continuous Infusions:    PRN Meds:.acetaminophen  **OR** acetaminophen , bisacodyl , ondansetron  **OR** ondansetron  (ZOFRAN ) IV, polyethylene glycol   I have personally reviewed following labs and imaging studies  LABORATORY DATA: CBC: Recent Labs  Lab 02/25/24 2144 02/26/24 0432 02/27/24 0327 02/28/24 0322  WBC 9.5 9.6 10.2 8.6  NEUTROABS 5.9  --   --   --  HGB 11.2* 10.8* 11.4* 10.6*  HCT 36.3* 34.7* 35.5* 33.1*  MCV 84.4 83.8 83.3 81.9  PLT 264 255 282 250    Basic Metabolic Panel: Recent Labs  Lab 02/25/24 2015 02/26/24 0432 02/27/24 0327 02/28/24 0322  NA 137 140 137 137  K 4.4 4.2 4.0 3.6  CL 104 106 103 100  CO2 23 24 23 25   GLUCOSE 132* 121* 121* 132*  BUN 17 16 18 20   CREATININE 0.93 0.87 1.08 1.06  CALCIUM  9.3 9.3 9.4 9.3  MG 1.6* 2.2  --   --   PHOS  --  3.3  --   --     GFR: Estimated Creatinine Clearance: 59.5 mL/min (by C-G formula based on SCr of 1.06  mg/dL).  Liver Function Tests: Recent Labs  Lab 02/25/24 2015  AST 21  ALT 11  ALKPHOS 48  BILITOT 0.3  PROT 6.7  ALBUMIN 3.3*   No results for input(s): "LIPASE", "AMYLASE" in the last 168 hours. Recent Labs  Lab 02/25/24 2015 02/26/24 0814  AMMONIA 42* <13    Coagulation Profile: No results for input(s): "INR", "PROTIME" in the last 168 hours.  Cardiac Enzymes: No results for input(s): "CKTOTAL", "CKMB", "CKMBINDEX", "TROPONINI" in the last 168 hours.  BNP (last 3 results) No results for input(s): "PROBNP" in the last 8760 hours.  Lipid Profile: No results for input(s): "CHOL", "HDL", "LDLCALC", "TRIG", "CHOLHDL", "LDLDIRECT" in the last 72 hours.  Thyroid  Function Tests: No results for input(s): "TSH", "T4TOTAL", "FREET4", "T3FREE", "THYROIDAB" in the last 72 hours.   Anemia Panel: No results for input(s): "VITAMINB12", "FOLATE", "FERRITIN", "TIBC", "IRON ", "RETICCTPCT" in the last 72 hours.   Urine analysis:    Component Value Date/Time   COLORURINE AMBER (A) 02/25/2024 2206   APPEARANCEUR HAZY (A) 02/25/2024 2206   LABSPEC 1.015 02/25/2024 2206   PHURINE 6.0 02/25/2024 2206   GLUCOSEU NEGATIVE 02/25/2024 2206   HGBUR LARGE (A) 02/25/2024 2206   BILIRUBINUR NEGATIVE 02/25/2024 2206   KETONESUR 5 (A) 02/25/2024 2206   PROTEINUR 100 (A) 02/25/2024 2206   NITRITE NEGATIVE 02/25/2024 2206   LEUKOCYTESUR NEGATIVE 02/25/2024 2206    Sepsis Labs: Lactic Acid, Venous    Component Value Date/Time   LATICACIDVEN 1.0 01/01/2022 0531    MICROBIOLOGY: Recent Results (from the past 240 hours)  Culture, blood (Routine X 2) w Reflex to ID Panel     Status: None (Preliminary result)   Collection Time: 02/26/24 11:28 AM   Specimen: BLOOD LEFT HAND  Result Value Ref Range Status   Specimen Description BLOOD LEFT HAND  Final   Special Requests   Final    BOTTLES DRAWN AEROBIC AND ANAEROBIC Blood Culture adequate volume   Culture   Final    NO GROWTH 4  DAYS Performed at [facility], [address], [place], [place] [address]    Report Status PENDING  Incomplete  Culture, blood (Routine X 2) w Reflex to ID Panel     Status: None (Preliminary result)   Collection Time: 02/26/24 11:35 AM   Specimen: BLOOD RIGHT HAND  Result Value Ref Range Status   Specimen Description BLOOD RIGHT HAND  Final   Special Requests   Final    BOTTLES DRAWN AEROBIC AND ANAEROBIC Blood Culture adequate volume   Culture   Final    NO GROWTH 4 DAYS Performed at [facility], [address] [place], [place] [address]    Report Status PENDING  Incomplete  RADIOLOGY STUDIES/RESULTS: No results found.    LOS: 3 days   Kimberly Penna, MD  [facility]    To contact the attending provider between 7A-7P or the covering provider during after hours 7P-7A, please log into the web site [email or web address] and access using universal [place] password for that web site. If you do not have the password, please call the hospital operator.  03/01/2024, 11:48 AM

## 2024-03-01 NOTE — Consult Note (Addendum)
 [facility] Follow-up  Patient Name: .Jordan [name]  MRN: [id number]  DOB: Jun 22, 1941  Consult Order details:  Orders (From admission, onward)     Start     Ordered   02/28/24 1130  IP CONSULT TO PSYCHIATRY       Comments: Complicated patient with Parkinson's-who has agitation/hallucination-maintained on Seroquel  as an outpatient-this was being slowly tapered down due to excessive daytime somnolence.  We stopped Seroquel  this hospitalization-but now is having issues with agitation.  Has been tried on numerous antipsychotics in the past-see neurology note.  I discussed with neurology on 4/29-they are recommending psych assistance for management of his delirium/agitation-especially at night.  Hoping you all could help.  Ordering Provider: Burton Casey, MD  Provider:  (Not yet assigned)  Question Answer Comment  Location [facility]   Reason for Consult? hallucinations/agitation etc-see below      02/28/24 1131             Mode of Visit: In person    Psychiatry Consult Evaluation  Service Date: Mar 01, 2024 LOS:  LOS: 3 days  Chief Complaint agitation at night and assistance with Seroquel  taper and medication management   Primary Psychiatric Diagnoses  hospital-associated delirium    Assessment  Jordan [name] is a 83 y.o. male admitted: Medically for 02/25/2024  7:27 PM for increased fatigue, confusion, and loss of appetite. He carries no previous psychiatric diagnoses.  His current presentation of waxing and waning confusion with some nonsensical responses to questions is most consistent with hospital associated delirium. When we were initially consulted, we started the patient on Seroquel  25 mg nightly. On examination, patient was partially alert and oriented. He had difficulty understand some of the questions and his responses were not making sense at times. He does report improved sleep last night compared to previous nights. We  recommend patient continue Seroquel  and continue following the hyperactive delirium recommendations, see below. We will continue to follow to monitor for continued improving evening agitation.  At this time, the patient's presentation is most consistent with hyperactive delirium, most likely due to multiple etiologies including but not limited to infection, medications, pain, altered sleep/wake cycle, and limited mobility. During this time period, minimization of delirogenic insults will be of utmost importance; this includes promoting the normal circadian cycle, minimizing lines/tubes, avoiding deliriogenic medications such as benzodiazepines and anticholinergic medications, and frequently reorienting the patient. Symptomatic treatment for agitation can be provided by antipsychotic medications, though it is important to remember that these do not treat the underlying etiology of delirium. Notably, there can be a time lag effect between treatment of a medical problem and resolution of delirium. This time lag effect may be of longer duration in the elderly, and those with underlying cognitive impairment or brain injury, as in this pt with 5+ years of cognitive decline.   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)   OSA on CPAP     Plan   ## Psychiatric Medication Recommendations:  -Continue Seroquel  25 mg at 1700 to target symptoms of sundowning  ## Medical Decision Making Capacity: Not specifically addressed in this encounter  ## Further Work-up:  -- most  recent EKG on 4/28 had QtC of 438 -- Pertinent labwork reviewed earlier this admission includes: Hgb 10.6, gluc  132, ferritin 20, iron  40, folate and vitamin B12 WNL   ## Disposition:-- There are no psychiatric contraindications to discharge at this time  ## Behavioral / Environmental: -Delirium Precautions: Delirium Interventions for Nursing and Staff: - RN to open blinds every AM. - To Bedside: Glasses, hearing aide, and pt's own shoes. Make available to patients. when possible and encourage use. - Encourage po fluids when appropriate, keep fluids within reach. - OOB to chair with meals. - Passive ROM exercises to all extremities with AM & PM care. - RN to assess orientation to person, time and place QAM and PRN. - Recommend extended visitation hours with familiar family/friends as feasible. - Staff to minimize disturbances at night. Turn off television when pt asleep or when not in use.   ## Safety and Observation Level:  - Based on my clinical evaluation, I estimate the patient to be at low risk of self harm in the current setting. - At this time, we recommend  routine. This decision is based on my review of the chart including patient's history and current presentation, interview of the patient, mental status examination, and consideration of suicide risk including evaluating suicidal ideation, plan, intent, suicidal or self-harm behaviors, risk factors, and protective factors. This judgment is based on our ability to directly address suicide risk, implement suicide prevention strategies, and develop a safety plan while the patient is in the clinical setting. Please contact our team if there is a concern that risk level has changed.  CSSR Risk Category:C-SSRS RISK CATEGORY: No Risk  Suicide Risk Assessment: Patient has following modifiable risk factors for suicide:  Patient has following non-modifiable or demographic risk factors for suicide: male gender Patient has the following protective factors against suicide: Supportive family, no history of suicide attempts, and no history of NSSIB  Thank you for  this consult request. Recommendations have been communicated to the primary team.  We will continue to follow at this time.   Joice Nares, MD       History of Present Illness  Relevant Aspects of Hospital Course:  Admitted on 02/25/2024 for increased fatigue, confusion, and loss of appetite. No cute findings on CT head, received IVF hydration, PT consulted. Home meds for Parkingson disease, Hx of CVA, Hx DVT, OSA, and TIIDM continued.  Patient Report:  Patient seen at bedside, no acute distress. He is partially alert and oriented.  He was able to state his name, date of birth, and location.  He was partially alert to the year but did not know the month or day of the week.  He did not know what brought him to the hospital.  He states that he had improved sleep last night but still reported waking up throughout the night.  He states are things in his mind and names that are uncommon.  He reports good appetite and sometimes forgetting he ate breakfast.  He denied adverse effects from Seroquel .  He denies SI, HI, AVH.  Psych ROS:  Depression: denies Anxiety:  denies Mania (lifetime and current): denies Psychosis: (lifetime and current): denies   Review of Systems  Constitutional:  Negative for chills and fever.  Respiratory:  Negative for shortness of breath.   Cardiovascular:  Negative for chest pain and palpitations.  Gastrointestinal:  Negative for nausea and vomiting.  Neurological:  Negative for headaches.     Psychiatric and Social History  Psychiatric History:  Information collected from chart review and patient  Dx: None reported Tx: None previously Prior psychiatric hospitalizations: None reported  Social History:  reports that he quit smoking about 56 years ago. His smoking use included cigarettes. He started smoking about 62 years ago. He has a 15 pack-year smoking history. He has never used smokeless tobacco. He reports current alcohol  use. He reports that he does not  use drugs.    Exam Findings  Vital Signs:  Temp:  [97.6 F (36.4 C)-98.3 F (36.8 C)] 97.6 F (36.4 C) (05/01 0346) Pulse Rate:  [65-80] 67 (05/01 0730) Resp:  [19-20] 20 (05/01 0346) BP: (115-123)/(65-87) 123/84 (05/01 0346) SpO2:  [90 %-96 %] 91 % (05/01 0730) Blood pressure 123/84, pulse 67, temperature 97.6 F (36.4 C), temperature source Axillary, resp. rate 20, height 5\' 9"  (1.753 m), weight 96.8 kg, SpO2 91%. Body mass index is 31.5 kg/m.  Physical Exam Vitals reviewed.  Constitutional:      Appearance: Normal appearance.  HENT:     Head: Normocephalic and atraumatic.  Cardiovascular:     Rate and Rhythm: Normal rate.  Pulmonary:     Effort: Pulmonary effort is normal.  Neurological:     Mental Status: He is alert. He is disoriented.     Mental Status Exam: General Appearance: Fairly Groomed  Orientation:  Other:  name and DOB, place; partially the date and no situation  Memory:  Negative  Concentration:  Concentration: Fair  Recall:  Poor  Attention  Fair  Eye Contact:  Fair  Speech:  Slow  Language:  Good  Volume:  Normal  Mood: Euthymic  Affect:  Appropriate and Congruent  Thought Process:  Disorganized  Thought Content:  Logical at times, sometimes nonsensical answers  Suicidal Thoughts:  No  Homicidal Thoughts:  No  Judgement:  Fair  Insight:  Shallow  Psychomotor Activity:  Normal  Akathisia:  No  Fund of Knowledge:  Fair      Assets:  Leisure Time Resilience  Cognition:  Impaired,  Mild  ADL's:  Impaired  AIMS (if indicated):        Other History   These have been pulled in through the EMR, reviewed, and updated if appropriate.  Family History:  The patient's family history includes Colon cancer in his sister; Sleep apnea in his sister.  Medical History: Past Medical History:  Diagnosis Date   Arthritis    R shoulder, bone spur   Arthritis    "hands" (03/25/2016)   Chronic diastolic heart failure (HCC)    Chronic lower back pain     Coronary heart disease    Dr Ollie Bhat   Diverticulitis 12/2021   DVT (deep venous thrombosis) (HCC) 05/2022   left leg   H/O cardiovascular stress test    perhaps last one was 2009   Heart murmur    12/29/11 echo: mild MR, no AS, trivial TR   History of gout    Hyperlipidemia    Hypertension    saw Dr. Pauletta Boroughs last earl;y- 2014, cardiac cath. last done ?2009, blocks seen didn't require any intervention at that point.   Narcolepsy    [name] 04-11-97; Mean Latency 1.83min, SOREM 2   OSA on CPAP    NPSG 12-13-98 AHI 22.7   PONV (postoperative nausea and vomiting)    Presence of permanent cardiac pacemaker    Shortness of breath    with exertion    Stroke (HCC)    Type II diabetes  mellitus [facility])     Surgical History: Past Surgical History:  Procedure Laterality Date   CARDIAC CATHETERIZATION  2009?   EP IMPLANTABLE DEVICE N/A 03/25/2016   Procedure: Pacemaker Implant - Dual Chamber;  Surgeon: Tammie Fall, MD;  [facility] INVAS[facility] CV LAB;  Service: Cardiovascular;  Laterality: N/A;   FRACTURE SURGERY     HIP FRACTURE SURGERY     INSERT / REPLACE / REMOVE PACEMAKER  03/24/2016   INTRAMEDULLARY (IM) NAIL INTERTROCHANTERIC Left 02/08/2022   Procedure: INTRAMEDULLARY (IM) NAIL INTERTROCHANTRIC;  Surgeon: Jasmine Mesi, MD;  Location: [facility] OR;  Service: Orthopedics;  Laterality: Left;   IR KYPHO LUMBAR INC FX REDUCE BONE BX UNI/BIL CANNULATION INC/IMAGING  12/29/2021   IR RADIOLOGIST EVAL & MGMT  12/15/2021   IR RADIOLOGY PERIPHERAL GUIDED IV START  07/14/2022   IR US  GUIDE VASC ACCESS RIGHT  07/15/2022   LEFT AND RIGHT HEART CATHETERIZATION WITH CORONARY ANGIOGRAM N/A 06/05/2014   Procedure: LEFT AND RIGHT HEART CATHETERIZATION WITH CORONARY ANGIOGRAM;  Surgeon: Mickiel Albany, MD;  Location: [facility] CATH LAB;  Service: Cardiovascular;  Laterality: N/A;   NASAL FRACTURE SURGERY     SHOULDER ARTHROSCOPY WITH ROTATOR CUFF REPAIR AND SUBACROMIAL DECOMPRESSION Right 08/16/2013    Procedure: SHOULDER ARTHROSCOPY WITH ROTATOR CUFF REPAIR AND SUBACROMIAL DECOMPRESSION;  Surgeon: Derald Flattery, MD;  Location: [facility] OR;  Service: Orthopedics;  Laterality: Right;  Right shoulder arthroscopy rotator cuff repair, subacromial decompression.   TONSILLECTOMY       Medications:   Current Facility-Administered Medications:    acetaminophen  (TYLENOL ) tablet 650 mg, 650 mg, Oral, Q6H PRN, 650 mg at 02/26/24 2144 **OR** acetaminophen  (TYLENOL ) suppository 650 mg, 650 mg, Rectal, Q6H PRN, Opyd, Santana Cue, MD   atorvastatin  (LIPITOR) tablet 20 mg, 20 mg, Oral, QHS, Opyd, Timothy S, MD, 20 mg at 02/29/24 2117   bisacodyl  (DULCOLAX) EC tablet 5 mg, 5 mg, Oral, Daily PRN, Opyd, Timothy S, MD   carbidopa -levodopa  (SINEMET  CR) 50-200 MG per tablet controlled release 1 tablet, 1 tablet, Oral, QID, Opyd, Timothy S, MD, 1 tablet at 03/01/24 1610   insulin  aspart (novoLOG ) injection 0-5 Units, 0-5 Units, Subcutaneous, QHS, Opyd, Timothy S, MD   insulin  aspart (novoLOG ) injection 0-6 Units, 0-6 Units, Subcutaneous, TID WC, Opyd, Timothy S, MD, 1 Units at 03/01/24 9604   melatonin tablet 10 mg, 10 mg, Oral, QHS, Opyd, Timothy S, MD, 10 mg at 02/29/24 2117   modafinil  (PROVIGIL ) tablet 200 mg, 200 mg, Oral, Daily, Opyd, Timothy S, MD, 200 mg at 03/01/24 5409   ondansetron  (ZOFRAN ) tablet 4 mg, 4 mg, Oral, Q6H PRN **OR** ondansetron  (ZOFRAN ) injection 4 mg, 4 mg, Intravenous, Q6H PRN, Opyd, Timothy S, MD   polyethylene glycol (MIRALAX  / GLYCOLAX ) packet 17 g, 17 g, Oral, Daily PRN, Opyd, Timothy S, MD   QUEtiapine  (SEROQUEL ) tablet 25 mg, 25 mg, Oral, QPM, Starkes-Perry, Takia S, FNP, 25 mg at 02/29/24 1629   rivaroxaban  (XARELTO ) tablet 20 mg, 20 mg, Oral, Q supper, Opyd, Timothy S, MD, 20 mg at 02/29/24 1629   sodium chloride  flush (NS) 0.9 % injection 3 mL, 3 mL, Intravenous, Q12H, Opyd, Timothy S, MD, 3 mL at 03/01/24 8119   tamsulosin  (FLOMAX ) capsule 0.4 mg, 0.4 mg, Oral, QHS, Opyd,  Timothy S, MD, 0.4 mg at 02/29/24 2116  Allergies: Allergies  Allergen Reactions   Lisinopril      Unknown reaction   Amantadines Other (See Comments)    Mental changes   Clonazepam   Other (See Comments)    Mental changes   Zoloft  [Sertraline ] Other (See Comments)    Mental changes    Joice Nares, MD

## 2024-03-01 NOTE — TOC Progression Note (Addendum)
 Transition of Care [facility]) - Progression Note    Patient Details  Name: Jordan [name] MRN: [id number] Date of Birth: 11/27/1940  [facility]) CM/SW Contact  Jannice Mends, LCSW Phone Number: 03/01/2024, 9:11 AM  Clinical Narrative:    9am-[facility] will begin insurance process once OT note is in today.  12:03 PM-[facility] is working on Nature conservation officer from the [place].  Expected Discharge Plan: Skilled Nursing Facility Barriers to Discharge: Continued Medical Work up, English as a second language teacher, SNF Pending bed offer  Expected Discharge Plan and Services In-house Referral: Clinical Social Work   Post Acute Care Choice: IP Rehab Living arrangements for the past 2 months: Single Family Home                                       Social Determinants of Health (SDOH) Interventions SDOH Screenings   Food Insecurity: No Food Insecurity (02/26/2024)  Housing: Unknown (02/26/2024)  Transportation Needs: No Transportation Needs (02/26/2024)  Utilities: Not At Risk (02/26/2024)  Depression (PHQ2-9): Low Risk  (04/12/2023)  Social Connections: Moderately Integrated (02/26/2024)  Tobacco Use: Medium Risk (02/25/2024)    Readmission Risk Interventions     No data to display

## 2024-03-02 DIAGNOSIS — Z8673 Personal history of transient ischemic attack (TIA), and cerebral infarction without residual deficits: Secondary | ICD-10-CM | POA: Diagnosis not present

## 2024-03-02 DIAGNOSIS — Z86718 Personal history of other venous thrombosis and embolism: Secondary | ICD-10-CM | POA: Diagnosis not present

## 2024-03-02 DIAGNOSIS — G9341 Metabolic encephalopathy: Secondary | ICD-10-CM | POA: Diagnosis not present

## 2024-03-02 DIAGNOSIS — G9340 Encephalopathy, unspecified: Secondary | ICD-10-CM | POA: Diagnosis not present

## 2024-03-02 DIAGNOSIS — E1169 Type 2 diabetes mellitus with other specified complication: Secondary | ICD-10-CM | POA: Diagnosis not present

## 2024-03-02 LAB — CULTURE, BLOOD (ROUTINE X 2)
Culture: NO GROWTH
Culture: NO GROWTH
Special Requests: ADEQUATE
Special Requests: ADEQUATE

## 2024-03-02 LAB — GLUCOSE, CAPILLARY
Glucose-Capillary: 149 mg/dL — ABNORMAL HIGH (ref 70–99)
Glucose-Capillary: 187 mg/dL — ABNORMAL HIGH (ref 70–99)
Glucose-Capillary: 150 mg/dL — ABNORMAL HIGH (ref 70–99)

## 2024-03-02 MED ORDER — QUETIAPINE FUMARATE 25 MG PO TABS: 25.0000 mg | ORAL_TABLET | Freq: Every evening | ORAL | Status: DC

## 2024-03-02 MED ORDER — MIDODRINE HCL 5 MG PO TABS: 5.0000 mg | ORAL_TABLET | Freq: Two times a day (BID) | ORAL | Status: DC

## 2024-03-02 NOTE — TOC Progression Note (Addendum)
 Transition of Care [facility]) - Progression Note    Patient Details  Name: Jordan [name] MRN: [id number] Date of Birth: 05-16-1941  Transition [facility]) CM/SW Contact  Jannice Mends, LCSW Phone Number: 03/02/2024, 9:39 AM  Clinical Narrative:    9am-Per [facility], [facility]. Patient's spouse has requested that [facility] try to obtain authorization through [employer]. In the meantime, CSW faxed VA referral for SNF level of care.   2:16 PM-[facility] has received insurance approval from [employer]. They are ready to accept patient today. MD updated. CSW left voicemail for spouse.   Expected Discharge Plan: Skilled Nursing Facility Barriers to Discharge: Continued Medical Work up, English as a second language teacher, SNF Pending bed offer  Expected Discharge Plan and Services In-house Referral: Clinical Social Work   Post Acute Care Choice: IP Rehab Living arrangements for the past 2 months: Single Family Home                                       Social Determinants of Health (SDOH) Interventions SDOH Screenings   Food Insecurity: No Food Insecurity (02/26/2024)  Housing: Unknown (02/26/2024)  Transportation Needs: No Transportation Needs (02/26/2024)  Utilities: Not At Risk (02/26/2024)  Depression (PHQ2-9): Low Risk  (04/12/2023)  Social Connections: Moderately Integrated (02/26/2024)  Tobacco Use: Medium Risk (02/25/2024)    Readmission Risk Interventions     No data to display

## 2024-03-02 NOTE — TOC Transition Note (Addendum)
 Transition of Care [facility]) - Discharge Note   Patient Details  Name: Jordan [name] MRN: [id number] Date of Birth: April 09, 1941  [facility]) CM/SW Contact:  Jordan Mends, LCSW Phone Number: 03/02/2024, 2:46 PM   Clinical Narrative:    Patient will DC to: [facility] Anticipated DC date: 03/02/24 Family notified: Spouse Transport by: Jordan [name] called 2:47 PM   Per MD patient ready for DC to [facility]. RN to call report prior to discharge [phone] ). RN, patient, patient's family, and facility notified of DC. Discharge Summary sent to facility. DC packet on chart. Ambulance transport requested for patient.   CSW will sign off for now as social work intervention is no longer needed. Please consult us  again if new needs arise.     Final next level of care: IP Rehab Facility Barriers to Discharge: Barriers Resolved   Patient Goals and CMS Choice Patient states their goals for this hospitalization and ongoing recovery are:: Rehab CMS Medicare.gov Compare Post Acute Care list provided to:: Patient Represenative (must comment) Choice offered to / list presented to : Spouse [place] ownership interest in [facility].provided to:: Spouse    Discharge Placement                Patient to be transferred to facility by: [facility] Name of family member notified: Spouse Patient and family notified of of transfer: 03/02/24  Discharge Plan and Services Additional resources added to the After Visit Summary for   In-house Referral: Clinical Social Work   Post Acute Care Choice: IP Rehab                               Social Drivers of Health (SDOH) Interventions SDOH Screenings   Food Insecurity: No Food Insecurity (02/26/2024)  Housing: Unknown (02/26/2024)  Transportation Needs: No Transportation Needs (02/26/2024)  Utilities: Not At Risk (02/26/2024)  Depression (PHQ2-9): Low Risk  (04/12/2023)  Social Connections: Moderately Integrated (02/26/2024)   Tobacco Use: Medium Risk (02/25/2024)     Readmission Risk Interventions     No data to display

## 2024-03-02 NOTE — Plan of Care (Signed)
  Problem: Coping: Goal: Ability to adjust to condition or change in health will improve Outcome: Progressing   Problem: Skin Integrity: Goal: Risk for impaired skin integrity will decrease Outcome: Progressing   Problem: Clinical Measurements: Goal: Will remain free from infection Outcome: Progressing   Problem: Activity: Goal: Risk for activity intolerance will decrease Outcome: Progressing   Problem: Safety: Goal: Ability to remain free from injury will improve Outcome: Progressing

## 2024-03-02 NOTE — Discharge Summary (Signed)
 PATIENT DETAILS Name: Jordan [name] Age: 83 y.o. Sex: male Date of Birth: 05-17-1941 MRN: [id number]. Admitting Physician: Burton Casey, MD [facility]:[id number], Amiel Kalata, MD  Admit Date: 02/25/2024 Discharge date: 03/02/2024  Recommendations for Outpatient Follow-up:  Follow up with PCP in 1-2 weeks Please obtain CMP/CBC in one week Please ensure follow up [facility] Please ensure up with Neurology  Admitted From:  Home  Disposition: [facility]   Discharge Condition: good  CODE STATUS:   Code Status: Full Code   Diet recommendation:  Diet Order             Diet - low sodium heart healthy           Diet Carb Modified           Diet regular Room service appropriate? Yes; Fluid consistency: Thin  Diet effective now                    Brief Summary: Patient is a 83 y.o.  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   Significant events: 4/26>> admit to [facility]   Significant studies: 4/26>> CXR: No PNA 4/26>> CT head: No acute abnormality 4/26>> UA: 0-5 WBC. 4/27>> TSH: Normal 4/27>>NH4: normal 4/28>> Spot EEG: No seizures 4/28>> ABG: No hypercarbia-normal pH.   Significant microbiology data: 4/27>> blood culture-no growth.   Procedures: None   Consults: Neurology Psych Urology   Brief Hospital Course: Acute metabolic encephalopathy Failure to thrive syndrome Multifactorial etiology-in the setting of Seroquel  use/Parkinson's disease/known cognitive issues. PT/OT/SLP eval-[facility]. Overall improved-with supportive care-Seroquel  was initially discontinued but redeveloped hallucinations/agitation-after discussion with neurology-psychiatry was consulted-Seroquel  was restarted but timing changed to 5 PM -which patient seems to be tolerating well at this point without over  somnolence.   History of Parkinson's disease Continue Sinemet -Per my discussion with Dr. Lindzen several days ago-no need to change dosage at this point.  Further dosage adjustment deferred to outpatient neurology.   History of visual hallucinations Likely due to Parkinson's disease related cognitive issue and possible superimposed hospital delirium See above discussion regarding Seroquel -seems to be tolerating Seroquel  at 5 PM while as noted above.   History of daytime somnolence Apparently worse for the past several days prior to this hospitalization. On modafinil    History of orthostatic hypotension related to Parkinson's disease History of worsening and debility/weakness Previously able to use a walker and ambulate around the house-now barely able to ambulate. Continue low-dose midodrine  with holding parameters-TED stockings-and abdominal binder if needed Cautiously continuing with Flomax  as he has ureteral stones that hopefully will pass with supportive care.   History of CVA Nonfocal exam Not on antiplatelets as an anticoagulation Continue statin.   History of chronic HFpEF Euvolemic   DM-2 CBG stable-SSI  History of VTE Xarelto    History of PPM implantation   Recent history of Kidney stones Follows with Urology at [facility]-ureteroscopy planned-but postponed 2/2 to patient fatigue/weakness. Thankfully renal function is stable-no evidence of UTI Appreciate urology input-recommendations are to continue supportive care-and follow-up with primary urology as an outpatient. Continue Flomax    Iron  deficiency anemia Was supposed to get IV iron  as an outpatient-was in the process of setting this up. S/p IV iron  x 2 in the hospital-further iron  infusions will be deferred to the outpatient setting.  Etiology is likely slow loss through GI or GU tract (stones/microscopic hematuria on UA).  Continue to follow CBC closely-follows with oncology/hematology in the outpatient  setting.   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   Weight as of this encounter: 96.8 kg.    Agree with assessment and plan as outlined below. Pressure Injury 01/06/24 Coccyx Posterior Stage 1 -  Intact skin with non-blanchable redness of a localized area usually over a bony prominence. (Active)  01/06/24 1130  Location: Coccyx  Location Orientation: Posterior  Staging: Stage 1 -  Intact skin with non-blanchable redness of a localized area usually over a bony prominence.  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  Get Medicines reviewed and adjusted: Please take all your medications with you for your next visit with your Primary  MD  Laboratory/radiological data: Please request your Primary MD to go over all hospital tests and procedure/radiological results at the follow up, please ask your Primary MD to get all Hospital records sent to his/her office.  In some cases, they will be blood work, cultures and biopsy results pending at the time of your discharge. Please request that your primary care M.D. follows up on these results.  Also Note the following: If you experience worsening of your admission symptoms, develop shortness of breath, life threatening emergency, suicidal or homicidal thoughts you must seek medical attention immediately by calling 911 or calling your MD immediately  if symptoms less severe.  You must read complete instructions/literature along with all the possible adverse reactions/side effects for all the Medicines you take and that have been prescribed to you. Take any new Medicines after you have completely understood and accpet all the possible adverse reactions/side effects.   Do not drive when taking Pain medications or sleeping medications (Benzodaizepines)  Do not take more than prescribed Pain, Sleep and Anxiety Medications. It is not advisable to combine anxiety,sleep and pain medications without talking with your primary care practitioner  Special Instructions: If you have smoked or chewed Tobacco  in the last 2 yrs please stop smoking, stop any regular Alcohol   and or any Recreational drug use.  Wear Seat belts while driving.  Please note: You were cared for by a hospitalist during your  hospital stay. Once you are discharged, your primary care physician will handle any further medical issues. Please note that NO REFILLS for any discharge medications will be authorized once you are discharged, as it is imperative that you return to your primary care physician (or establish a relationship with a primary care physician if you do not have one) for your post hospital discharge needs so that  they can reassess your need for medications and monitor your lab values.   Increase activity slowly   Complete by: As directed    No wound care   Complete by: As directed       Allergies as of 03/02/2024       Reactions   Lisinopril     Unknown reaction   Amantadines Other (See Comments)   Mental changes   Clonazepam  Other (See Comments)   Mental changes   Zoloft  [sertraline ] Other (See Comments)   Mental changes        Medication List     STOP taking these medications    finasteride 5 MG tablet Commonly known as: PROSCAR   furosemide  20 MG tablet Commonly known as: LASIX    magnesium  gluconate 500 MG tablet Commonly known as: MAGONATE       TAKE these medications    acetaminophen  500 MG tablet Commonly known as: TYLENOL  Take 500-1,000 mg by mouth every 6 (six) hours as needed for moderate pain or mild pain.   atorvastatin  40 MG tablet Commonly known as: LIPITOR Take 0.5 tablets (20 mg total) by mouth daily. What changed: when to take this   b complex vitamins capsule Take 1 capsule by mouth at bedtime.   bifidobacterium infantis capsule Take 1 capsule by mouth daily.   carbidopa -levodopa  50-200 MG tablet Commonly known as: SINEMET  CR Take 1 tablet by mouth 4 (four) times daily.   clotrimazole-betamethasone cream Commonly known as: LOTRISONE Apply 1 Application topically daily as needed (Rash).   docusate sodium  100 MG capsule Commonly known as: COLACE Take 1 capsule (100 mg total) by mouth 2 (two) times daily. What changed:  when to take this reasons to take this additional instructions   feeding supplement (GLUCERNA SHAKE) Liqd Take 237 mLs by mouth 2 (two) times daily between meals. Alternate with ensure Diabetic   HM SALONPAS PAIN RELIEF EX Apply 1 patch topically daily as needed (Back pain).   lidocaine  5 % Commonly known as: LIDODERM  Place 1 patch onto the skin daily as needed. Purchase over the counter. On for 12 hours and off for  12 hours   Melatonin 10 MG Tabs Take 10 mg by mouth at bedtime.   metFORMIN  500 MG tablet Commonly known as: GLUCOPHAGE  Take 1,000 mg by mouth 2 (two) times daily. 1000 mg in the am and 1000 mg at night   Methylcobalamin 5000 MCG Chew Chew 500 mcg by mouth at bedtime.   midodrine  5 MG tablet Commonly known as: PROAMATINE  Take 1 tablet (5 mg total) by mouth 2 (two) times daily with a meal. HOLD IF SBP >130   modafinil  200 MG tablet Commonly known as: PROVIGIL  TAKE 1 TABLET BY MOUTH EVERY DAY   multivitamin with minerals Tabs tablet Take 1 tablet by mouth daily.   Nitrostat  0.4 MG SL tablet Generic drug: nitroGLYCERIN  PLACE 1 TABLET (0.4 MG TOTAL) UNDER THE TONGUE EVERY 5 (FIVE) MINUTES AS NEEDED FOR CHEST PAIN.   OMEGA 3 PO Take 1,280 mg by mouth at bedtime.   OVER THE COUNTER MEDICATION Take 72 mg by  mouth in the morning and at bedtime. Ferrasorb   polyethylene glycol 17 g packet Commonly known as: MIRALAX  / GLYCOLAX  Take 17 g by mouth daily. What changed:  when to take this reasons to take this   polyvinyl alcohol  1.4 % ophthalmic solution Commonly known as: LIQUIFILM TEARS Place 1 drop into both eyes as needed for dry eyes. What changed: when to take this   QUEtiapine  25 MG tablet Commonly known as: SEROQUEL  Take 1 tablet (25 mg total) by mouth every evening. TAKE AT 5 PM What changed:  when to take this additional instructions   rivaroxaban  20 MG Tabs tablet Commonly known as: Xarelto  Take 1 tablet (20 mg total) by mouth daily.   tamsulosin  0.4 MG Caps capsule Commonly known as: FLOMAX  Take 0.4 mg by mouth at bedtime.   Vitamin D  (Ergocalciferol ) 1.25 MG (50000 UNIT) Caps capsule Commonly known as: DRISDOL  TAKE 1 CAPSULE (50,000 UNITS TOTAL) BY MOUTH EVERY 7 (SEVEN) DAYS   Vitamin D  50 MCG (2000 UT) Caps Take 2,000 Units by mouth at bedtime.        Follow-up Information     Roselind Congo, MD. Schedule an appointment as soon as possible  for a visit in 1 week(s).   Specialty: Family Medicine Contact information: [phone] [place] [address] [phone]                Allergies  Allergen Reactions   Lisinopril      Unknown reaction   Amantadines Other (See Comments)    Mental changes   Clonazepam  Other (See Comments)    Mental changes   Zoloft  [Sertraline ] Other (See Comments)    Mental changes     Other Procedures/Studies: CT HEAD WO CONTRAST ( ) Result Date: 02/25/2024 EXAM: CT HEAD WITHOUT 02/25/2024 09:33:24 PM TECHNIQUE: CT of the head was performed without the administration of intravenous contrast. Automated exposure control, iterative reconstruction, and/or weight based adjustment of the mA/kV was utilized to reduce the radiation dose to as low as reasonably achievable. COMPARISON: 01/04/2024 CLINICAL HISTORY: Mental status change, unknown cause. Chief complaints: Weakness. FINDINGS: BRAIN AND VENTRICLES: There is no acute intracranial hemorrhage, mass effect or midline shift. No abnormal extra-axial fluid collection. The gray-white differentiation is maintained without evidence of an acute infarct. There is no evidence of hydrocephalus. Mild scattered periventricular and subcortical low density, likely reflecting small vessel ischemic changes. Intracranial arteriosclerosis. ORBITS: The visualized portion of the orbits demonstrate no acute abnormality. SINUSES: Opacification of the left frontal sinus. Partial opacification of the bilateral ethmoid and maxillary sinuses. SOFT TISSUES AND SKULL: No acute abnormality of the visualized skull or soft tissues. IMPRESSION: 1. No acute intracranial abnormality. 2. Atrophy with mild small vessel ischemic changes. Electronically signed by: Zadie Herter MD 02/25/2024 09:55 PM EDT RP Workstation: [id number]   DG Chest Port 1 View Result Date: 02/25/2024 EXAM: 1 VIEW(S) XRAY OF THE CHEST 02/25/2024 08:04:56 PM COMPARISON: CT chest 02/02/2024  CLINICAL HISTORY: Altered. FINDINGS: LUNGS AND PLEURA: Mild left basilar scarring/atelectasis. No consolidation. No pulmonary edema. No pleural effusion. No pneumothorax. HEART AND MEDIASTINUM: Left subclavian pacemaker. Thoracic aortic atherosclerosis. No acute abnormality of the cardiac and mediastinal silhouettes. BONES AND SOFT TISSUES: No acute osseous abnormality. IMPRESSION: 1. No acute cardiopulmonary pathology. Electronically signed by: Zadie Herter MD 02/25/2024 09:53 PM EDT RP Workstation: [id number]   CT CHEST ABDOMEN PELVIS W CONTRAST Result Date: 02/06/2024 CLINICAL DATA:  Neuroendocrine tumor of small bowel. Metastatic disease. * Tracking Code: BO *  EXAM: CT CHEST, ABDOMEN, AND PELVIS WITH CONTRAST TECHNIQUE: Multidetector CT imaging of the chest, abdomen and pelvis was performed following the standard protocol during bolus administration of intravenous contrast. RADIATION DOSE REDUCTION: This exam was performed according to the departmental dose-optimization program which includes automated exposure control, adjustment of the mA and/or kV according to patient size and/or use of iterative reconstruction technique. CONTRAST:  OMNIPAQUE  IOHEXOL  300 MG/ML  SOLN COMPARISON:  Renal stone CT 01/23/2024. Contrast CT 08/05/2023 abdomen pelvis. PET-CT scan 08/12/2022. Multiple older exams as well. FINDINGS: CT CHEST FINDINGS Cardiovascular: Left upper chest battery pack identified. Lead extends along the right side of the heart. Heart is nonenlarged. Trace pericardial fluid or thickening. The thoracic aorta has a normal course and caliber with slight atherosclerotic calcified plaque. Mediastinum/Nodes: Slightly patulous thoracic esophagus. Heterogeneous thyroid  gland. No specific abnormal lymph node enlargement identified in the axillary regions, hilum or mediastinum. No abnormal supraclavicular or chest wall nodes. Lungs/Pleura: Lungs are without consolidation, pneumothorax or effusion. There is  some linear changes along lung bases likely scar or atelectasis. There is some peripheral areas of scarring atelectatic changes the lungs as well with some fibrosis. There is some scattered bilateral calcified lung nodules consistent with old granulomatous disease. Some calcifications along left hilar nodes as well. There is also a noncalcified nodule seen in the right lung adjacent to interlobar fissure measuring 4 mm on series 4, image 75 which is unchanged in retrospect from prior examination. No new dominant lung nodule identified. Musculoskeletal: Scattered degenerative changes along the spine. Slight curvature. CT ABDOMEN PELVIS FINDINGS Hepatobiliary: Fatty liver infiltration identified. Patent portal vein. Gallbladder is nondilated. Previously there was a hypermetabolic lesion in segment 7 of the liver medially. On prior CT this measured 2.4 x 2.5 cm. Today once again this is a heterogeneous lesion with a calcification. Is also somewhat poorly defined as there is streak artifact with the arms being scanned at the patient's side. Overall lesion is felt to measure approximately 2.9 x 2.9 cm on series 2, image 43. Just caudal to this is a smaller lesion which was not clearly hypermetabolic on the prior PET-CT measuring 18 mm on series 2, image 46. Going back to older exam this lesion present and measured 18 mm. Small cyst identified in segment 6 on series 2, image 55, unchanged. There is also a tiny subcapsular focus anteriorly in the dome liver in segment 4 on series 2, image 48 which is also unchanged from previous examination. No clear new liver lesion identified. Pancreas: Atrophic pancreas. There is some small cystic areas identified such as towards the head which previously measured 2.2 x 1.0 cm and today on series 2, image 63 2.0 by 1.7 cm. Spleen: Normal in size without focal abnormality. Adrenals/Urinary Tract: The adrenal glands are preserved. Bilateral renal atrophy. There are small parenchymal cyst  as well as some parapelvic left-sided renal cysts identified. Nonspecific perinephric stranding. Punctate nonobstructing right-sided renal stone. Some larger foci on the left including a renal pelvic stone measuring 14 mm. There is some adjacent fat stranding at the hilum, unchanged from the recent CT scan. Minimal ectasia of the left ureter once again seen as well. There continue to be is a least 2 stones along the course of the distal left ureter. Please see coronal series 5, image 104. Previously there were 2 stones but these were immediately adjacent to 1 another. There is slightly more separated today. Underdistended urinary bladder. Stomach/Bowel: On this non oral contrast exam, the large  bowel has a normal course and caliber. Scattered stool in luminal fluid in the colon. Few scattered colonic diverticula more on the left side than right. There is a normal retrocecal appendix. Moderate debris in the nondilated stomach. Small bowel is nondilated. Previously there is an area of wall thickening and mild enhancement of a loop of small bowel in the right lower quadrant. This same loop is seen but the enhancement and wall thickening is not as well appreciated today. Vascular/Lymphatic: Aortic atherosclerosis. No enlarged abdominal or pelvic lymph nodes. No retroperitoneal or pelvic sidewall lymph node enlargement. Reproductive: Prostate is unremarkable. Other: Central mesenteric soft tissue mass identified right of midline with some dystrophic calcification once again identified. Lesion previously was measured at 2.1 x 1.5 cm and today when measured in the same fashion 2.0 x 1.7 cm, not seen with change when adjusted for measurement variation. Cephalocaudal length 2.5 cm today and previously 2.4 cm. Small calcification along the mesentery along the left side, unchanged on series 2, image 83, nonspecific. Of note measurements today of lesions is compared to the prior contrast CT scan of October 2024 for more  consistent comparison. Musculoskeletal: Curvature of the spine with osteopenia multifocal degenerative changes. Degenerative changes of the pelvis. Dynamic left hip screw in place with streak artifact. Augmentation cement at the vertebral body of L2 with mild compression, unchanged from previous. Stable lucency along left side of L4 with density of fat. IMPRESSION: Central right-sided mesenteric soft tissue mass with dystrophic calcification is similar to previous examination. The previous small bowel area of enhancement in the right lower quadrant is nodule appreciated today. Subtle liver lesions are once again identified. It is somewhat difficult to directly measure these lesions with the streak artifact and motion. The 2 lesions in the right hepatic lobe could be slightly larger today. Recommend continued surveillance. No definite new liver lesion. Stable cystic lesion of the pancreatic head with severe pancreatic atrophy globally. Bilateral renal cysts as well as nonobstructing stones. In addition there continue be a large stone in the renal pelvis on the left with stranding as well as 2 small distal left ureteral stones. Please correlate with clinical history. Colonic diverticula.  No bowel obstruction. Electronically Signed   By: Adrianna Horde M.D.   On: 02/06/2024 15:52     TODAY-DAY OF DISCHARGE:  Subjective:   Ricka Charity today has no headache,no chest abdominal pain,no new weakness tingling or numbness, feels much better wants to go home today.   Objective:   Blood pressure (!) 150/90, pulse 69, temperature 98.2 F (36.8 C), resp. rate 16, height 5\' 9"  (1.753 m), weight 96.8 kg, SpO2 90%.  Intake/Output Summary (Last 24 hours) at 03/02/2024 1428 Last data filed at 03/02/2024 0950 Gross per 24 hour  Intake 243 ml  Output 450 ml  Net -207 ml   Filed Weights   02/25/24 1930  Weight: 96.8 kg    Exam: Awake Alert, Oriented *3, No new F.N deficits, Normal affect [place].AT,PERRAL Supple  Neck,No JVD, No cervical lymphadenopathy appriciated.  Symmetrical Chest wall movement, Good air movement bilaterally, CTAB RRR,No Gallops,Rubs or new Murmurs, No Parasternal Heave +ve B.Sounds, Abd Soft, Non tender, No organomegaly appriciated, No rebound -guarding or rigidity. No Cyanosis, Clubbing or edema, No new Rash or bruise   PERTINENT RADIOLOGIC STUDIES: No results found.   PERTINENT LAB RESULTS: CBC: No results for input(s): "WBC", "HGB", "HCT", "PLT" in the last 72 hours. CMET CMP     Component Value Date/Time   NA  137 02/28/2024 0322   NA 145 07/23/2021 0000   K 3.6 02/28/2024 0322   CL 100 02/28/2024 0322   CO2 25 02/28/2024 0322   GLUCOSE 132 (H) 02/28/2024 0322   BUN 20 02/28/2024 0322   BUN 14 07/23/2021 0000   CREATININE 1.06 02/28/2024 0322   CREATININE 1.02 12/13/2023 1221   CREATININE 1.16 03/18/2016 1011   CALCIUM  9.3 02/28/2024 0322   PROT 6.7 02/25/2024 2015   ALBUMIN 3.3 (L) 02/25/2024 2015   AST 21 02/25/2024 2015   AST 20 12/13/2023 1221   ALT 11 02/25/2024 2015   ALT 5 12/13/2023 1221   ALKPHOS 48 02/25/2024 2015   BILITOT 0.3 02/25/2024 2015   BILITOT 0.4 12/13/2023 1221   GFR 86.49 05/31/2014 1427   GFRNONAA >60 02/28/2024 0322   GFRNONAA >60 12/13/2023 1221    GFR Estimated Creatinine Clearance: 59.5 mL/min (by C-G formula based on SCr of 1.06 mg/dL). No results for input(s): "LIPASE", "AMYLASE" in the last 72 hours. No results for input(s): "CKTOTAL", "CKMB", "CKMBINDEX", "TROPONINI" in the last 72 hours. Invalid input(s): "POCBNP" No results for input(s): "DDIMER" in the last 72 hours. No results for input(s): "HGBA1C" in the last 72 hours. No results for input(s): "CHOL", "HDL", "LDLCALC", "TRIG", "CHOLHDL", "LDLDIRECT" in the last 72 hours. No results for input(s): "TSH", "T4TOTAL", "T3FREE", "THYROIDAB" in the last 72 hours.  Invalid input(s): "FREET3" No results for input(s): "VITAMINB12", "FOLATE", "FERRITIN", "TIBC", "IRON ",  "RETICCTPCT" in the last 72 hours. Coags: No results for input(s): "INR" in the last 72 hours.  Invalid input(s): "PT" Microbiology: Recent Results (from the past 240 hours)  Culture, blood (Routine X 2) w Reflex to ID Panel     Status: None   Collection Time: 02/26/24 11:28 AM   Specimen: BLOOD LEFT HAND  Result Value Ref Range Status   Specimen Description BLOOD LEFT HAND  Final   Special Requests   Final    BOTTLES DRAWN AEROBIC AND ANAEROBIC Blood Culture adequate volume   Culture   Final    NO GROWTH 5 DAYS Performed at [facility], [address] [place], [place] [address]    Report Status 03/02/2024 FINAL  Final  Culture, blood (Routine X 2) w Reflex to ID Panel     Status: None   Collection Time: 02/26/24 11:35 AM   Specimen: BLOOD RIGHT HAND  Result Value Ref Range Status   Specimen Description BLOOD RIGHT HAND  Final   Special Requests   Final    BOTTLES DRAWN AEROBIC AND ANAEROBIC Blood Culture adequate volume   Culture   Final    NO GROWTH 5 DAYS Performed at [facility], [address], [place], [place] [address]    Report Status 03/02/2024 FINAL  Final    FURTHER DISCHARGE INSTRUCTIONS:  Get Medicines reviewed and adjusted: Please take all your medications with you for your next visit with your Primary MD  Laboratory/radiological data: Please request your Primary MD to go over all hospital tests and procedure/radiological results at the follow up, please ask your Primary MD to get all Hospital records sent to his/her office.  In some cases, they will be blood work, cultures and biopsy results pending at the time of your discharge. Please request that your primary care M.D. goes through all the records of your hospital data and follows up on these results.  Also Note the following: If you experience worsening of your admission symptoms, develop shortness of breath, life threatening  emergency, suicidal or homicidal thoughts you must seek medical  attention immediately by calling 911 or calling your MD immediately  if symptoms less severe.  You must read complete instructions/literature along with all the possible adverse reactions/side effects for all the Medicines you take and that have been prescribed to you. Take any new Medicines after you have completely understood and accpet all the possible adverse reactions/side effects.   Do not drive when taking Pain medications or sleeping medications (Benzodaizepines)  Do not take more than prescribed Pain, Sleep and Anxiety Medications. It is not advisable to combine anxiety,sleep and pain medications without talking with your primary care practitioner  Special Instructions: If you have smoked or chewed Tobacco  in the last 2 yrs please stop smoking, stop any regular Alcohol   and or any Recreational drug use.  Wear Seat belts while driving.  Please note: You were cared for by a hospitalist during your hospital stay. Once you are discharged, your primary care physician will handle any further medical issues. Please note that NO REFILLS for any discharge medications will be authorized once you are discharged, as it is imperative that you return to your primary care physician (or establish a relationship with a primary care physician if you do not have one) for your post hospital discharge needs so that they can reassess your need for medications and monitor your lab values.  Total Time spent coordinating discharge including counseling, education and face to face time equals greater than 30 minutes.  Signed: Lashun Mccants 03/02/2024 2:28 PM

## 2024-03-02 NOTE — Progress Notes (Signed)
 Called report to [employer], Merchandiser, retail at [employer].   Oral Billings, RN

## 2024-03-02 NOTE — Progress Notes (Signed)
 PROGRESS NOTE        PATIENT DETAILS Name: Jordan [name] Age: 83 y.o. Sex: male Date of Birth: 10/20/1941 Admit Date: 02/25/2024 Admitting Physician Tylene Galla Donzell Gallery, MD [id number], Amiel Kalata, MD  Brief Summary: Patient is a 83 y.o.  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  Significant events: 4/26>> admit to [facility]  Significant studies: 4/26>> CXR: No PNA 4/26>> CT head: No acute abnormality 4/26>> UA: 0-5 WBC. 4/27>> TSH: Normal 4/27>>NH4: normal 4/28>> Spot EEG: No seizures 4/28>> ABG: No hypercarbia-normal pH.  Significant microbiology data: 4/27>> blood culture-no growth.  Procedures: None  Consults: Neurology Psych Urology  Subjective: No major issues overnight.  Sitting up in bed-recognize me from yesterday.  Answers simple questions appropriately.  Objective: Vitals: Blood pressure 129/69, pulse 67, temperature 98.2 F (36.8 C), temperature source Oral, resp. rate 17, height 5\' 9"  (1.753 m), weight 96.8 kg, SpO2 93%.   Exam: Awake/alert-answering simple questions appropriately Chest: Clear to auscultation Abdomen: Soft nontender nondistended Extremities: No edema Neurology: Nonfocal-but with generalized weakness.  Pertinent Labs/Radiology:    Latest Ref Rng & Units 02/28/2024    3:22 AM 02/27/2024    3:27 AM 02/26/2024    4:32 AM  CBC  WBC 4.0 - 10.5 K/uL 8.6  10.2  9.6   Hemoglobin 13.0 - 17.0 g/dL 46.9  62.9  52.8   Hematocrit 39.0 - 52.0 % 33.1  35.5  34.7   Platelets 150 - 400 K/uL 250  282  255     Lab Results  Component Value Date   NA 137 02/28/2024   K 3.6 02/28/2024   CL 100 02/28/2024   CO2 25 02/28/2024     Assessment/Plan: Acute metabolic encephalopathy Failure to thrive syndrome Multifactorial etiology-in the setting of Seroquel  use/Parkinson's disease/known cognitive  issues. PT/OT/SLP eval-[facility]. Overall improved-with supportive care-Seroquel  was initially discontinued but redeveloped hallucinations/agitation-after discussion with neurology-psychiatry was consulted-Seroquel  was restarted but timing changed to 5 PM -which patient seems to be tolerating well at this point without over somnolence.  History of Parkinson's disease Continue Sinemet -Per my discussion with Dr. Lindzen several days ago-no need to change dosage at this point.  Further dosage adjustment deferred to outpatient neurology.  History of visual hallucinations Likely due to Parkinson's disease related cognitive issue and possible superimposed hospital delirium See above discussion regarding Seroquel -seems to be tolerating Seroquel  at 5 PM while as noted above.  History of daytime somnolence Apparently worse for the past several days prior to this hospitalization. On modafinil   History of orthostatic hypotension related to Parkinson's disease History of worsening and debility/weakness Previously able to use a walker and ambulate around the house-now barely able to ambulate. Continue to work with PT/OT.  Social work following for rehab placement. Continue low-dose midodrine  with holding parameters-TED stockings-and abdominal binder if needed Cautiously continuing with Flomax  as he has ureteral stones that hopefully will pass with supportive care.  History of CVA Nonfocal exam Not on antiplatelets as an anticoagulation Continue statin.  History of chronic HFpEF Euvolemic  DM-2 CBG stable-SSI Recent Labs    03/01/24 2114 03/02/24 0729 03/02/24 1117  GLUCAP 155* 149* 187*     History of VTE Xarelto   History of PPM implantation  Recent history of Kidney stones Follows  with Urology at [facility]-ureteroscopy planned-but postponed 2/2 to patient fatigue/weakness. Thankfully renal function is stable-no evidence of UTI Appreciate urology  input-recommendations are to continue supportive care-and follow-up with primary urology as an outpatient. Continue Flomax   Iron  deficiency anemia Was supposed to get IV iron  as an outpatient-was in the process of setting this up. S/p IV iron  x 2 in the hospital-further iron  infusions will be deferred to the outpatient setting.  Etiology is likely slow loss through GI or GU tract (stones/microscopic hematuria on UA).   Continue to follow CBC closely-follows with oncology/hematology in the outpatient setting.  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   Weight as of this encounter: 96.8 kg.   Agree with assessment and plan as outlined below. Pressure Injury 01/06/24 Coccyx Posterior Stage 1 -  Intact skin with non-blanchable redness of a localized area usually over a bony prominence. (Active)  01/06/24 1130  Location: Coccyx  Location Orientation: Posterior  Staging: Stage 1 -  Intact skin with non-blanchable redness of a localized area usually over a bony prominence.  Wound Description (Comments):   Present on Admission:      Code status:   Code Status: Full Code   DVT Prophylaxis:Place TED hose Start: 03/01/24 1154 rivaroxaban  (XARELTO ) tablet 20 mg     Family Communication: Spouse-Kelly Milhouse-[phone]-updated over the phone on 5/1-I have again discussed with her in great detail regarding medication changes that she has been requesting for the past several days.  Given his confounding issues with possible delirium/kidney stones-chronicity of his problems-I have urged her to wait till patient is a bit more stable-back in his familiar surroundings before making significant changes  to his medications (and she was inquiring about changing formulation of Sinemet , stopping Flomax  etc.)   Disposition Plan: Status is: Observation The patient will require care spanning > 2 midnights and should be moved to inpatient because: Severity of illness   Planned Discharge Destination:Inpatient Rehab-medically stable for discharge if bed available.   Diet: Diet Order             Diet regular Room service appropriate? Yes; Fluid consistency: Thin  Diet effective now                     Antimicrobial agents: Anti-infectives (From admission, onward)    None        MEDICATIONS: Scheduled Meds:  atorvastatin   20 mg Oral QHS   carbidopa -levodopa   1 tablet Oral QID   insulin  aspart  0-5 Units Subcutaneous QHS   insulin  aspart  0-6 Units Subcutaneous TID WC   melatonin  10 mg Oral QHS   midodrine   5 mg Oral BID WC   modafinil   200 mg Oral Daily   QUEtiapine   25 mg Oral QPM   rivaroxaban   20 mg Oral Q supper   sodium chloride  flush  3 mL Intravenous Q12H   tamsulosin   0.4 mg Oral QHS   Continuous Infusions:    PRN Meds:.acetaminophen  **OR** acetaminophen , bisacodyl , ondansetron  **OR** ondansetron  (ZOFRAN ) IV, polyethylene glycol   I have personally reviewed following labs and imaging studies  LABORATORY DATA: CBC: Recent Labs  Lab 02/25/24 2144 02/26/24 0432 02/27/24 0327 02/28/24 0322  WBC 9.5 9.6 10.2  8.6  NEUTROABS 5.9  --   --   --   HGB 11.2* 10.8* 11.4* 10.6*  HCT 36.3* 34.7* 35.5* 33.1*  MCV 84.4 83.8 83.3 81.9  PLT 264 255 282 250    Basic Metabolic Panel: Recent Labs  Lab 02/25/24 2015 02/26/24 0432 02/27/24 0327 02/28/24 0322  NA 137 140 137 137  K 4.4 4.2 4.0 3.6  CL 104 106 103 100  CO2 23 24 23 25   GLUCOSE 132* 121* 121* 132*  BUN 17 16 18 20   CREATININE 0.93 0.87 1.08 1.06  CALCIUM  9.3 9.3 9.4 9.3  MG 1.6* 2.2  --   --   PHOS  --  3.3  --   --     GFR: Estimated Creatinine Clearance: 59.5 mL/min (by C-G formula  based on SCr of 1.06 mg/dL).  Liver Function Tests: Recent Labs  Lab 02/25/24 2015  AST 21  ALT 11  ALKPHOS 48  BILITOT 0.3  PROT 6.7  ALBUMIN 3.3*   No results for input(s): "LIPASE", "AMYLASE" in the last 168 hours. Recent Labs  Lab 02/25/24 2015 02/26/24 0814  AMMONIA 42* <13    Coagulation Profile: No results for input(s): "INR", "PROTIME" in the last 168 hours.  Cardiac Enzymes: No results for input(s): "CKTOTAL", "CKMB", "CKMBINDEX", "TROPONINI" in the last 168 hours.  BNP (last 3 results) No results for input(s): "PROBNP" in the last 8760 hours.  Lipid Profile: No results for input(s): "CHOL", "HDL", "LDLCALC", "TRIG", "CHOLHDL", "LDLDIRECT" in the last 72 hours.  Thyroid  Function Tests: No results for input(s): "TSH", "T4TOTAL", "FREET4", "T3FREE", "THYROIDAB" in the last 72 hours.   Anemia Panel: No results for input(s): "VITAMINB12", "FOLATE", "FERRITIN", "TIBC", "IRON ", "RETICCTPCT" in the last 72 hours.   Urine analysis:    Component Value Date/Time   COLORURINE AMBER (A) 02/25/2024 2206   APPEARANCEUR HAZY (A) 02/25/2024 2206   LABSPEC 1.015 02/25/2024 2206   PHURINE 6.0 02/25/2024 2206   GLUCOSEU NEGATIVE 02/25/2024 2206   HGBUR LARGE (A) 02/25/2024 2206   BILIRUBINUR NEGATIVE 02/25/2024 2206   KETONESUR 5 (A) 02/25/2024 2206   PROTEINUR 100 (A) 02/25/2024 2206   NITRITE NEGATIVE 02/25/2024 2206   LEUKOCYTESUR NEGATIVE 02/25/2024 2206    Sepsis Labs: Lactic Acid, Venous    Component Value Date/Time   LATICACIDVEN 1.0 01/01/2022 0531    MICROBIOLOGY: Recent Results (from the past 240 hours)  Culture, blood (Routine X 2) w Reflex to ID Panel     Status: None   Collection Time: 02/26/24 11:28 AM   Specimen: BLOOD LEFT HAND  Result Value Ref Range Status   Specimen Description BLOOD LEFT HAND  Final   Special Requests   Final    BOTTLES DRAWN AEROBIC AND ANAEROBIC Blood Culture adequate volume   Culture   Final    NO GROWTH 5  DAYS Performed at [facility], [address], [place], [place] [address]    Report Status 03/02/2024 FINAL  Final  Culture, blood (Routine X 2) w Reflex to ID Panel     Status: None   Collection Time: 02/26/24 11:35 AM   Specimen: BLOOD RIGHT HAND  Result Value Ref Range Status   Specimen Description BLOOD RIGHT HAND  Final   Special Requests   Final    BOTTLES DRAWN AEROBIC AND ANAEROBIC Blood Culture adequate volume   Culture   Final    NO GROWTH 5 DAYS Performed at [facility], [address] [place], [place]  [address]    Report Status 03/02/2024 FINAL  Final    RADIOLOGY STUDIES/RESULTS: No results found.    LOS: 4 days   Kimberly Penna, MD  [facility]    To contact the attending provider between 7A-7P or the covering provider during after hours 7P-7A, please log into the web site [email or web address] and access using universal [place] password for that web site. If you do not have the password, please call the hospital operator.  03/02/2024, 11:43 AM

## 2024-03-03 DIAGNOSIS — I110 Hypertensive heart disease with heart failure: Secondary | ICD-10-CM | POA: Diagnosis not present

## 2024-03-03 DIAGNOSIS — I509 Heart failure, unspecified: Secondary | ICD-10-CM | POA: Diagnosis not present

## 2024-03-03 DIAGNOSIS — E559 Vitamin D deficiency, unspecified: Secondary | ICD-10-CM | POA: Diagnosis not present

## 2024-03-03 DIAGNOSIS — E119 Type 2 diabetes mellitus without complications: Secondary | ICD-10-CM | POA: Diagnosis not present

## 2024-03-03 DIAGNOSIS — E785 Hyperlipidemia, unspecified: Secondary | ICD-10-CM | POA: Diagnosis not present

## 2024-03-03 DIAGNOSIS — G4720 Circadian rhythm sleep disorder, unspecified type: Secondary | ICD-10-CM | POA: Diagnosis not present

## 2024-03-04 DIAGNOSIS — G4720 Circadian rhythm sleep disorder, unspecified type: Secondary | ICD-10-CM | POA: Diagnosis not present

## 2024-03-04 DIAGNOSIS — E785 Hyperlipidemia, unspecified: Secondary | ICD-10-CM | POA: Diagnosis not present

## 2024-03-04 DIAGNOSIS — E119 Type 2 diabetes mellitus without complications: Secondary | ICD-10-CM | POA: Diagnosis not present

## 2024-03-04 DIAGNOSIS — E559 Vitamin D deficiency, unspecified: Secondary | ICD-10-CM | POA: Diagnosis not present

## 2024-03-04 DIAGNOSIS — I509 Heart failure, unspecified: Secondary | ICD-10-CM | POA: Diagnosis not present

## 2024-03-04 DIAGNOSIS — I110 Hypertensive heart disease with heart failure: Secondary | ICD-10-CM | POA: Diagnosis not present

## 2024-03-05 ENCOUNTER — Telehealth: Payer: Self-pay | Admitting: Adult Health

## 2024-03-05 ENCOUNTER — Encounter: Payer: Medicare (Managed Care) | Admitting: Physical Medicine and Rehabilitation

## 2024-03-05 DIAGNOSIS — G20A1 Parkinson's disease without dyskinesia, without mention of fluctuations: Secondary | ICD-10-CM | POA: Diagnosis not present

## 2024-03-05 DIAGNOSIS — G9341 Metabolic encephalopathy: Secondary | ICD-10-CM | POA: Diagnosis not present

## 2024-03-05 DIAGNOSIS — R41840 Attention and concentration deficit: Secondary | ICD-10-CM | POA: Diagnosis not present

## 2024-03-05 DIAGNOSIS — E1165 Type 2 diabetes mellitus with hyperglycemia: Secondary | ICD-10-CM | POA: Diagnosis not present

## 2024-03-05 NOTE — Progress Notes (Signed)
 5/5: CSW received call from the [place] that patient was approved for a 30 day contract with the [employer] [facility]. CSW updated her that patient discharged to [facility]. She confirmed if SNF stay is needed after that, [facility] would need to resubmit for auth as a new condition.   Rhyanna Sorce LCSW, MSW, [facility]

## 2024-03-05 NOTE — Telephone Encounter (Signed)
 Urgent neurology referral faxed to [facility] (fax# [phone], phone, [phone])

## 2024-03-06 DIAGNOSIS — G9341 Metabolic encephalopathy: Secondary | ICD-10-CM | POA: Diagnosis not present

## 2024-03-06 DIAGNOSIS — N132 Hydronephrosis with renal and ureteral calculous obstruction: Secondary | ICD-10-CM | POA: Diagnosis not present

## 2024-03-06 DIAGNOSIS — E1165 Type 2 diabetes mellitus with hyperglycemia: Secondary | ICD-10-CM | POA: Diagnosis not present

## 2024-03-06 DIAGNOSIS — R41840 Attention and concentration deficit: Secondary | ICD-10-CM | POA: Diagnosis not present

## 2024-03-06 DIAGNOSIS — G20A1 Parkinson's disease without dyskinesia, without mention of fluctuations: Secondary | ICD-10-CM | POA: Diagnosis not present

## 2024-03-07 DIAGNOSIS — E1165 Type 2 diabetes mellitus with hyperglycemia: Secondary | ICD-10-CM | POA: Diagnosis not present

## 2024-03-07 DIAGNOSIS — G20A1 Parkinson's disease without dyskinesia, without mention of fluctuations: Secondary | ICD-10-CM | POA: Diagnosis not present

## 2024-03-07 DIAGNOSIS — G9341 Metabolic encephalopathy: Secondary | ICD-10-CM | POA: Diagnosis not present

## 2024-03-07 DIAGNOSIS — R41840 Attention and concentration deficit: Secondary | ICD-10-CM | POA: Diagnosis not present

## 2024-03-08 DIAGNOSIS — R41840 Attention and concentration deficit: Secondary | ICD-10-CM | POA: Diagnosis not present

## 2024-03-08 DIAGNOSIS — E1165 Type 2 diabetes mellitus with hyperglycemia: Secondary | ICD-10-CM | POA: Diagnosis not present

## 2024-03-08 DIAGNOSIS — G9341 Metabolic encephalopathy: Secondary | ICD-10-CM | POA: Diagnosis not present

## 2024-03-08 DIAGNOSIS — G20A1 Parkinson's disease without dyskinesia, without mention of fluctuations: Secondary | ICD-10-CM | POA: Diagnosis not present

## 2024-03-09 DIAGNOSIS — R41840 Attention and concentration deficit: Secondary | ICD-10-CM | POA: Diagnosis not present

## 2024-03-09 DIAGNOSIS — G20A1 Parkinson's disease without dyskinesia, without mention of fluctuations: Secondary | ICD-10-CM | POA: Diagnosis not present

## 2024-03-09 DIAGNOSIS — E1165 Type 2 diabetes mellitus with hyperglycemia: Secondary | ICD-10-CM | POA: Diagnosis not present

## 2024-03-09 DIAGNOSIS — G9341 Metabolic encephalopathy: Secondary | ICD-10-CM | POA: Diagnosis not present

## 2024-03-12 ENCOUNTER — Inpatient Hospital Stay: Payer: Medicare (Managed Care) | Admitting: Neurology

## 2024-03-13 NOTE — Telephone Encounter (Signed)
 Can patient be scheduled with Dr. Janett Medin towards end of May, once patient is discharged from rehab for hospital follow up regarding Parkinson's disease and further medication recommendations? Thank you!

## 2024-03-14 DIAGNOSIS — R4182 Altered mental status, unspecified: Secondary | ICD-10-CM | POA: Diagnosis not present

## 2024-03-14 NOTE — Telephone Encounter (Signed)
 Perfect! Thank you!   Dr. Janett Medin, just an FYI prior to visit, patient was recently hospitalized and now in rehab. He is working on getting into [facility] or [facility] disorder clinic for further recommendations (currently has appt with [facility] but not until Dec) but wanted him to be seen for follow up as this may take some time.   Just to summarize,   -OV 12/2023: his Sinemet  IR dosage was increased from 1.5 tab TID to 2 tab TID due to continued functional decline, primarily transferring via wheelchair, noted slower movements and increase stiffness, Sinemet  CR was continued and was referred to PT/OT/SLP.  -OV 01/2024: sooner visit d/t recurrent hospitalizations and frequent falls while ambulating.  Continued to have difficulty with standing, reporting LLE jerking movements prior to falling. Wife concerned SE from Seroquel  (has been on for hallucinations with benefit) and wished to taper off which we gradually did. Wife unable to correlate increased falls with increased Sinemet  dosage although concern of hypotension contributing to symptoms possibly due to Sinemet .  Was unable to obtain orthostatics during visit (was unable to stand) but was eventually able to be obtained by home health therapy which showed SBP dropped by 36 points when sitting to standing.  Concern possibly side effect from Sinemet  therefore gradually transitioned from IR to CR 50-200 4 times daily. Symptoms persisted despite transition and recommended decreasing Sinemet  CR to 1 tab 3 times daily.   - Hospitalized 4/26-5/2 for acute metabolic encephalopathy, failure to thrive, visual hallucinations, orthostatic hypotension and worsening debility/weakness.  He was restarted on Seroquel  by psychiatry with improvement of hallucinations.  Wife was greatly concerned regarding care he was receiving. She questioned alternative PD medications but this was understandably being deferred to be done/discussed outpatient.  Inpatient neurology did not  recommend making adjustments to Sinemet  as there was a concern his overall functioning would further decline without it. It was felt these symptoms were more due to progression of PD.  He was also started on midodrine  for orthostatic hypotension. He was d/c'd to [facility] inpatient rehab with plans on discharging home on 5/17.    Request follow up visit with Dr. Janett Medin to discuss current PD medications and further recommendations. She is also interested in switching from Seroquel  to Nuplazid as she is concerned of Seroquel  side effects.  She is concerned side effects he was experiencing in the hospital (per her report) including elevated glucose levels, dizziness, drowsiness, troubles with speech, fatigue/tiredness and constipation were being caused from Seroquel . She was advised that is difficult to say for sure if the symptoms were possibly from medications or possibly due to underlying Parkinson's disease gradually progressing.

## 2024-03-14 NOTE — Telephone Encounter (Signed)
 Spoke with patient's wife and scheduled follow up with Dr. Janett Medin for 03/29/24 at 4pm

## 2024-03-16 NOTE — Addendum Note (Signed)
 Addended by: Edra Govern D on: 03/16/2024 12:48 PM   Modules accepted: Orders

## 2024-03-16 NOTE — Progress Notes (Signed)
 Remote pacemaker transmission.

## 2024-03-19 ENCOUNTER — Ambulatory Visit: Payer: Medicare (Managed Care) | Admitting: Internal Medicine

## 2024-03-19 ENCOUNTER — Telehealth: Payer: Self-pay

## 2024-03-19 ENCOUNTER — Encounter: Payer: Self-pay | Admitting: Adult Health

## 2024-03-19 DIAGNOSIS — G20C Parkinsonism, unspecified: Secondary | ICD-10-CM

## 2024-03-19 NOTE — Telephone Encounter (Addendum)
 [facility] and [facility] are only have availability in December. Attempted to search for a work in appointment but could not find an appointment.

## 2024-03-19 NOTE — Telephone Encounter (Signed)
No messages received for at least 21 days Last message received 23 days ago. The patient will be deactivated in 67 days. 

## 2024-03-19 NOTE — Telephone Encounter (Signed)
 I called and spoke with the patient's wife (ok per DPR) regarding no transmissions being received from the patient's home monitor for ~ 21 days. Per Mrs. Morais, the patient was hospitalized at [facility] for ~ 1 week and has been in [facility] in [facility] for about another 2 weeks.  Plans are to discharge to home in the next few days.   I advised Mrs. Saulters that transmissions should resume once the patient is home.  Mrs. Steward voices understanding and is agreeable.

## 2024-03-20 NOTE — Telephone Encounter (Signed)
 [facility] movement clinic does not have any appointments available. [facility] next available appointment is December 8 at 9 am. Have scheduled patient appt December 8 at 9 am with Dr. Jeneane Miracle arrive 15 minutes prior of appt with insurance card and copay. If have signs of Covid please reschedule appointment Address [facility] 4th Floor.

## 2024-03-20 NOTE — Telephone Encounter (Signed)
 Referral for neurology fax to [facility]. Phone: [phone], Fax: [phone].

## 2024-03-20 NOTE — Telephone Encounter (Signed)
 [facility] movement clinic does not have any appointments available. [facility] next available appointment is December 8 at 9 am. Have scheduled patient appt December 8 at 9 am with Dr. Jeneane Miracle arrive 15 minutes prior of appt with insurance card and copay. If have signs of Covid please reschedule appointment Address [facility] 4th Floor.  Referral for neurology fax to [facility]. Phone: [phone], Fax: [phone]

## 2024-03-21 ENCOUNTER — Other Ambulatory Visit: Payer: Self-pay

## 2024-03-21 ENCOUNTER — Encounter (HOSPITAL_COMMUNITY): Payer: Self-pay

## 2024-03-21 ENCOUNTER — Emergency Department (HOSPITAL_COMMUNITY)

## 2024-03-21 ENCOUNTER — Inpatient Hospital Stay (HOSPITAL_COMMUNITY)
Admission: EM | Admit: 2024-03-21 | Discharge: 2024-03-31 | DRG: 069 | Disposition: A | Discharge status: Skilled Nursing Facility | Attending: Internal Medicine | Admitting: Internal Medicine

## 2024-03-21 DIAGNOSIS — Z6830 Body mass index (BMI) 30.0-30.9, adult: Secondary | ICD-10-CM

## 2024-03-21 DIAGNOSIS — Z86718 Personal history of other venous thrombosis and embolism: Secondary | ICD-10-CM

## 2024-03-21 DIAGNOSIS — Z993 Dependence on wheelchair: Secondary | ICD-10-CM

## 2024-03-21 DIAGNOSIS — C787 Secondary malignant neoplasm of liver and intrahepatic bile duct: Secondary | ICD-10-CM | POA: Diagnosis present

## 2024-03-21 DIAGNOSIS — R2990 Unspecified symptoms and signs involving the nervous system: Secondary | ICD-10-CM | POA: Diagnosis not present

## 2024-03-21 DIAGNOSIS — G4733 Obstructive sleep apnea (adult) (pediatric): Secondary | ICD-10-CM

## 2024-03-21 DIAGNOSIS — R29810 Facial weakness: Secondary | ICD-10-CM | POA: Diagnosis not present

## 2024-03-21 DIAGNOSIS — G9340 Encephalopathy, unspecified: Principal | ICD-10-CM

## 2024-03-21 DIAGNOSIS — E119 Type 2 diabetes mellitus without complications: Secondary | ICD-10-CM | POA: Diagnosis not present

## 2024-03-21 DIAGNOSIS — C7B8 Other secondary neuroendocrine tumors: Secondary | ICD-10-CM | POA: Diagnosis present

## 2024-03-21 DIAGNOSIS — I69391 Dysphagia following cerebral infarction: Secondary | ICD-10-CM | POA: Diagnosis not present

## 2024-03-21 DIAGNOSIS — I480 Paroxysmal atrial fibrillation: Secondary | ICD-10-CM | POA: Diagnosis present

## 2024-03-21 DIAGNOSIS — F0282 Dementia in other diseases classified elsewhere, unspecified severity, with psychotic disturbance: Secondary | ICD-10-CM | POA: Diagnosis present

## 2024-03-21 DIAGNOSIS — I951 Orthostatic hypotension: Secondary | ICD-10-CM | POA: Diagnosis present

## 2024-03-21 DIAGNOSIS — R471 Dysarthria and anarthria: Secondary | ICD-10-CM | POA: Diagnosis present

## 2024-03-21 DIAGNOSIS — Z794 Long term (current) use of insulin: Secondary | ICD-10-CM | POA: Diagnosis not present

## 2024-03-21 DIAGNOSIS — N390 Urinary tract infection, site not specified: Secondary | ICD-10-CM | POA: Diagnosis present

## 2024-03-21 DIAGNOSIS — G20A1 Parkinson's disease without dyskinesia, without mention of fluctuations: Secondary | ICD-10-CM | POA: Diagnosis not present

## 2024-03-21 DIAGNOSIS — E669 Obesity, unspecified: Secondary | ICD-10-CM | POA: Diagnosis present

## 2024-03-21 DIAGNOSIS — R5381 Other malaise: Secondary | ICD-10-CM | POA: Diagnosis present

## 2024-03-21 DIAGNOSIS — R29716 NIHSS score 16: Secondary | ICD-10-CM | POA: Diagnosis present

## 2024-03-21 DIAGNOSIS — E1169 Type 2 diabetes mellitus with other specified complication: Secondary | ICD-10-CM | POA: Diagnosis present

## 2024-03-21 DIAGNOSIS — R4701 Aphasia: Secondary | ICD-10-CM | POA: Diagnosis present

## 2024-03-21 DIAGNOSIS — R531 Weakness: Secondary | ICD-10-CM | POA: Diagnosis not present

## 2024-03-21 DIAGNOSIS — R29818 Other symptoms and signs involving the nervous system: Secondary | ICD-10-CM

## 2024-03-21 DIAGNOSIS — C799 Secondary malignant neoplasm of unspecified site: Secondary | ICD-10-CM | POA: Diagnosis not present

## 2024-03-21 DIAGNOSIS — L89151 Pressure ulcer of sacral region, stage 1: Secondary | ICD-10-CM | POA: Diagnosis present

## 2024-03-21 DIAGNOSIS — I110 Hypertensive heart disease with heart failure: Secondary | ICD-10-CM | POA: Diagnosis present

## 2024-03-21 DIAGNOSIS — R569 Unspecified convulsions: Secondary | ICD-10-CM | POA: Diagnosis not present

## 2024-03-21 DIAGNOSIS — G459 Transient cerebral ischemic attack, unspecified: Principal | ICD-10-CM | POA: Diagnosis present

## 2024-03-21 DIAGNOSIS — G8191 Hemiplegia, unspecified affecting right dominant side: Secondary | ICD-10-CM | POA: Diagnosis present

## 2024-03-21 DIAGNOSIS — Z87891 Personal history of nicotine dependence: Secondary | ICD-10-CM

## 2024-03-21 DIAGNOSIS — Z950 Presence of cardiac pacemaker: Secondary | ICD-10-CM | POA: Diagnosis not present

## 2024-03-21 DIAGNOSIS — Z7901 Long term (current) use of anticoagulants: Secondary | ICD-10-CM

## 2024-03-21 DIAGNOSIS — C7A8 Other malignant neuroendocrine tumors: Secondary | ICD-10-CM | POA: Diagnosis present

## 2024-03-21 DIAGNOSIS — I2510 Atherosclerotic heart disease of native coronary artery without angina pectoris: Secondary | ICD-10-CM | POA: Diagnosis present

## 2024-03-21 DIAGNOSIS — Z7984 Long term (current) use of oral hypoglycemic drugs: Secondary | ICD-10-CM

## 2024-03-21 DIAGNOSIS — Z8673 Personal history of transient ischemic attack (TIA), and cerebral infarction without residual deficits: Secondary | ICD-10-CM

## 2024-03-21 DIAGNOSIS — E1165 Type 2 diabetes mellitus with hyperglycemia: Secondary | ICD-10-CM | POA: Diagnosis present

## 2024-03-21 DIAGNOSIS — E785 Hyperlipidemia, unspecified: Secondary | ICD-10-CM | POA: Diagnosis present

## 2024-03-21 DIAGNOSIS — I959 Hypotension, unspecified: Secondary | ICD-10-CM | POA: Diagnosis not present

## 2024-03-21 DIAGNOSIS — I5032 Chronic diastolic (congestive) heart failure: Secondary | ICD-10-CM | POA: Diagnosis present

## 2024-03-21 DIAGNOSIS — I10 Essential (primary) hypertension: Secondary | ICD-10-CM | POA: Diagnosis present

## 2024-03-21 DIAGNOSIS — E1151 Type 2 diabetes mellitus with diabetic peripheral angiopathy without gangrene: Secondary | ICD-10-CM | POA: Diagnosis not present

## 2024-03-21 DIAGNOSIS — R1310 Dysphagia, unspecified: Secondary | ICD-10-CM | POA: Diagnosis present

## 2024-03-21 DIAGNOSIS — I639 Cerebral infarction, unspecified: Secondary | ICD-10-CM | POA: Diagnosis not present

## 2024-03-21 DIAGNOSIS — R4182 Altered mental status, unspecified: Secondary | ICD-10-CM | POA: Diagnosis not present

## 2024-03-21 DIAGNOSIS — Z888 Allergy status to other drugs, medicaments and biological substances status: Secondary | ICD-10-CM

## 2024-03-21 DIAGNOSIS — Z751 Person awaiting admission to adequate facility elsewhere: Secondary | ICD-10-CM

## 2024-03-21 DIAGNOSIS — Z79899 Other long term (current) drug therapy: Secondary | ICD-10-CM

## 2024-03-21 DIAGNOSIS — I509 Heart failure, unspecified: Secondary | ICD-10-CM | POA: Diagnosis not present

## 2024-03-21 LAB — COMPREHENSIVE METABOLIC PANEL WITH GFR
ALT: 6 U/L (ref 0–44)
AST: 26 U/L (ref 15–41)
Albumin: 3.2 g/dL — ABNORMAL LOW (ref 3.5–5.0)
Alkaline Phosphatase: 62 U/L (ref 38–126)
Anion gap: 10 (ref 5–15)
BUN: 13 mg/dL (ref 8–23)
CO2: 22 mmol/L (ref 22–32)
Calcium: 9.3 mg/dL (ref 8.9–10.3)
Chloride: 107 mmol/L (ref 98–111)
Creatinine, Ser: 0.94 mg/dL (ref 0.61–1.24)
GFR, Estimated: >60 mL/min (ref >60)
Glucose, Bld: 194 mg/dL — ABNORMAL HIGH (ref 70–99)
Potassium: 3.3 mmol/L — ABNORMAL LOW (ref 3.5–5.1)
Sodium: 139 mmol/L (ref 135–145)
Total Bilirubin: 0.5 mg/dL (ref 0.0–1.2)
Total Protein: 6.7 g/dL (ref 6.5–8.1)

## 2024-03-21 LAB — URINALYSIS, ROUTINE W REFLEX MICROSCOPIC
Bilirubin Urine: NEGATIVE
Glucose, UA: NEGATIVE mg/dL
Ketones, ur: 5 mg/dL — AB
Leukocytes,Ua: NEGATIVE
Nitrite: NEGATIVE
Protein, ur: 100 mg/dL — AB
RBC / HPF: >50 RBC/hpf (ref 0–5)
Specific Gravity, Urine: >1.046 — ABNORMAL HIGH (ref 1.005–1.030)
pH: 5 (ref 5.0–8.0)

## 2024-03-21 LAB — CBC
HCT: 38.6 % — ABNORMAL LOW (ref 39.0–52.0)
Hemoglobin: 12.3 g/dL — ABNORMAL LOW (ref 13.0–17.0)
MCH: 28 pg (ref 26.0–34.0)
MCHC: 31.9 g/dL (ref 30.0–36.0)
MCV: 87.7 fL (ref 80.0–100.0)
Platelets: 219 10*3/uL (ref 150–400)
RBC: 4.4 MIL/uL (ref 4.22–5.81)
RDW: 19.3 % — ABNORMAL HIGH (ref 11.5–15.5)
WBC: 7.9 10*3/uL (ref 4.0–10.5)
nRBC: 0 % (ref 0.0–0.2)

## 2024-03-21 LAB — RAPID URINE DRUG SCREEN, HOSP PERFORMED
Amphetamines: NOT DETECTED
Barbiturates: NOT DETECTED
Benzodiazepines: NOT DETECTED
Cocaine: NOT DETECTED
Opiates: NOT DETECTED
Tetrahydrocannabinol: NOT DETECTED

## 2024-03-21 LAB — DIFFERENTIAL
Abs Immature Granulocytes: 0.03 10*3/uL (ref 0.00–0.07)
Basophils Absolute: 0.1 10*3/uL (ref 0.0–0.1)
Basophils Relative: 1 %
Eosinophils Absolute: 0.3 10*3/uL (ref 0.0–0.5)
Eosinophils Relative: 4 %
Immature Granulocytes: 0 %
Lymphocytes Relative: 26 %
Lymphs Abs: 2 10*3/uL (ref 0.7–4.0)
Monocytes Absolute: 0.8 10*3/uL (ref 0.1–1.0)
Monocytes Relative: 10 %
Neutro Abs: 4.7 10*3/uL (ref 1.7–7.7)
Neutrophils Relative %: 59 %

## 2024-03-21 LAB — I-STAT CHEM 8, ED
BUN: 14 mg/dL (ref 8–23)
Calcium, Ion: 1.22 mmol/L (ref 1.15–1.40)
Chloride: 105 mmol/L (ref 98–111)
Creatinine, Ser: 1 mg/dL (ref 0.61–1.24)
Glucose, Bld: 195 mg/dL — ABNORMAL HIGH (ref 70–99)
HCT: 37 % — ABNORMAL LOW (ref 39.0–52.0)
Hemoglobin: 12.6 g/dL — ABNORMAL LOW (ref 13.0–17.0)
Potassium: 3.4 mmol/L — ABNORMAL LOW (ref 3.5–5.1)
Sodium: 141 mmol/L (ref 135–145)
TCO2: 24 mmol/L (ref 22–32)

## 2024-03-21 LAB — CBG MONITORING, ED
Glucose-Capillary: 180 mg/dL — ABNORMAL HIGH (ref 70–99)
Glucose-Capillary: 150 mg/dL — ABNORMAL HIGH (ref 70–99)

## 2024-03-21 LAB — PROTIME-INR
INR: 2 — ABNORMAL HIGH (ref 0.8–1.2)
Prothrombin Time: 22.8 s — ABNORMAL HIGH (ref 11.4–15.2)

## 2024-03-21 LAB — APTT: aPTT: 36 s (ref 24–36)

## 2024-03-21 LAB — ETHANOL: Alcohol, Ethyl (B): <15 mg/dL (ref <15)

## 2024-03-21 MED ORDER — STROKE: EARLY STAGES OF RECOVERY BOOK
Freq: Once | Status: AC
  Filled 2024-03-21: qty 1

## 2024-03-21 MED ORDER — INSULIN ASPART 100 UNIT/ML IJ SOLN
0.0000 [IU] | Freq: Three times a day (TID) | INTRAMUSCULAR | Status: DC
  Administered 2024-03-22 – 2024-03-23 (×3): 1 [IU] via SUBCUTANEOUS
  Administered 2024-03-24: 2 [IU] via SUBCUTANEOUS
  Administered 2024-03-24 – 2024-03-26 (×5): 1 [IU] via SUBCUTANEOUS
  Administered 2024-03-26: 3 [IU] via SUBCUTANEOUS
  Administered 2024-03-27 – 2024-03-29 (×4): 1 [IU] via SUBCUTANEOUS
  Administered 2024-03-29: 2 [IU] via SUBCUTANEOUS
  Administered 2024-03-30 (×3): 1 [IU] via SUBCUTANEOUS
  Administered 2024-03-31: 3 [IU] via SUBCUTANEOUS

## 2024-03-21 MED ORDER — SODIUM CHLORIDE 0.9% FLUSH
3.0000 mL | Freq: Two times a day (BID) | INTRAVENOUS | Status: DC
  Administered 2024-03-21 – 2024-03-31 (×19): 3 mL via INTRAVENOUS

## 2024-03-21 MED ORDER — IOHEXOL 350 MG/ML SOLN
75.0000 mL | Freq: Once | INTRAVENOUS | Status: AC | PRN
  Administered 2024-03-21: 75 mL via INTRAVENOUS

## 2024-03-21 NOTE — H&P (Signed)
 History and Physical    Jordan [name]:[id number] DOB: 04-30-1941 DOA: 03/21/2024  PCP: Roselind Congo, MD   Patient coming from: Primary care clinic   Chief Complaint:  Chief Complaint  Patient presents with   Code Stroke    HPI: History limited by patients confusion, provided by wife at bedside  Jordan [name] is a 83 y.o. male with hx of Parkinson disease, CVA, HFpEF, pacemaker, history of DVT on Xarelto , type 2 diabetes mellitus, orthostatic hypotension, OSA on CPAP, and metastatic neuroendocrine tumor, recent admission to [facility] 4/26 - 5/2 for encephalopathy felt to be related to Parkinson's for medication effect from Seroquel .  Who presents with R sided weakness and aphasia.  Wife reports that he was discharged from rehab facility yesterday.  Today was at his primary care appointment for follow-up.  After leaving the office wife noted that he slumped forward in his wheelchair and right arm was flaccid.  He was awake but unable to respond to any questioning.  Notes they brought him back in to clinic and brief assessment with vitals, CBG which were within normal limit and transported to the ED. arrived as a code stroke, see additional ED course below.  At time my interview wife feels that he is mostly back towards his normal.  Patient himself has limited recollection of events leading up to hospitalization today.  Currently without complaint of unilateral weakness numbness.  Denies any fall or head injury, recent vision change (reports intermittent diplopia times few months).  Speech back towards baseline, wife reports he has slight amount of dysarthria from his Parkinson's.   Otherwise wife notes that a few days ago patient had an episode of agitation and the following morning noted to have what sounds like encephalopathy.  Was awake but less responsive.  Reports gradual improvement over the next few days.    Review of Systems:  ROS complete and negative except as marked above    Allergies  Allergen Reactions   Lisinopril      Unknown reaction   Amantadines Other (See Comments)    Mental changes   Clonazepam  Other (See Comments)    Mental changes   Zoloft  [Sertraline ] Other (See Comments)    Mental changes    Prior to Admission medications   Medication Sig Start Date End Date Taking? Authorizing Provider  acetaminophen  (TYLENOL ) 500 MG tablet Take 500-1,000 mg by mouth every 6 (six) hours as needed for moderate pain or mild pain.    [provider]  atorvastatin  (LIPITOR) 40 MG tablet Take 0.5 tablets (20 mg total) by mouth daily. Patient taking differently: Take 20 mg by mouth at bedtime. 07/30/21   Medina-Vargas, Monina C, NP  b complex vitamins capsule Take 1 capsule by mouth at bedtime.    [provider]  bifidobacterium infantis (ALIGN) capsule Take 1 capsule by mouth daily.    [provider]  Camphor-Menthol-Methyl Sal (HM SALONPAS PAIN RELIEF EX) Apply 1 patch topically daily as needed (Back pain).    [provider]  carbidopa -levodopa  (SINEMET  CR) 50-200 MG tablet Take 1 tablet by mouth 4 (four) times daily. 02/09/24   Johny Nap, NP  Cholecalciferol  (VITAMIN D ) 50 MCG (2000 UT) CAPS Take 2,000 Units by mouth at bedtime.    [provider]  clotrimazole-betamethasone (LOTRISONE) cream Apply 1 Application topically daily as needed (Rash). 06/21/22   [provider]  docusate sodium  (COLACE) 100 MG capsule Take 1 capsule (100 mg total) by mouth 2 (two) times daily.  Patient taking differently: Take 100 mg by mouth daily as needed for mild constipation or moderate constipation. As Needed 02/17/22   Akula, Vijaya, MD  feeding supplement, GLUCERNA SHAKE, (GLUCERNA SHAKE) LIQD Take 237 mLs by mouth 2 (two) times daily between meals. Alternate with ensure Diabetic    [provider]  lidocaine  (LIDODERM ) 5 % Place 1 patch onto the skin daily as needed. Purchase over the counter. On for 12 hours  and off for 12 hours 01/18/22   Love, Renay Carota, PA-C  Melatonin 10 MG TABS Take 10 mg by mouth at bedtime.    [provider]  metFORMIN  (GLUCOPHAGE ) 500 MG tablet Take 1,000 mg by mouth 2 (two) times daily. 1000 mg in the am and 1000 mg at night    [provider]  Methylcobalamin 5000 MCG CHEW Chew 500 mcg by mouth at bedtime.    [provider]  midodrine  (PROAMATINE ) 5 MG tablet Take 1 tablet (5 mg total) by mouth 2 (two) times daily with a meal. HOLD IF SBP >130 03/02/24   Ghimire, Estil Heman, MD  modafinil  (PROVIGIL ) 200 MG tablet TAKE 1 TABLET BY MOUTH EVERY DAY 12/12/23   Rosa College D, MD  Multiple Vitamin (MULTIVITAMIN WITH MINERALS) TABS tablet Take 1 tablet by mouth daily. 02/19/18   Barbee Lew, MD  NITROSTAT  0.4 MG SL tablet PLACE 1 TABLET (0.4 MG TOTAL) UNDER THE TONGUE EVERY 5 (FIVE) MINUTES AS NEEDED FOR CHEST PAIN. Patient not taking: Reported on 02/25/2024 03/31/16   Arty Binning, MD  Omega-3 Fatty Acids (OMEGA 3 PO) Take 1,280 mg by mouth at bedtime.    [provider]  OVER THE COUNTER MEDICATION Take 72 mg by mouth in the morning and at bedtime. Ferrasorb    [provider]  polyethylene glycol (MIRALAX  / GLYCOLAX ) 17 g packet Take 17 g by mouth daily. Patient taking differently: Take 17 g by mouth daily as needed for mild constipation. 01/09/22   Lesa Rape, MD  polyvinyl alcohol  (LIQUIFILM TEARS) 1.4 % ophthalmic solution Place 1 drop into both eyes as needed for dry eyes. Patient taking differently: Place 1 drop into both eyes daily as needed for dry eyes. 01/18/22   Love, Renay Carota, PA-C  QUEtiapine  (SEROQUEL ) 25 MG tablet Take 1 tablet (25 mg total) by mouth every evening. TAKE AT 5 PM 03/02/24   Ghimire, Estil Heman, MD  rivaroxaban  (XARELTO ) 20 MG TABS tablet Take 1 tablet (20 mg total) by mouth daily. 01/02/24   Boscia, Heather E, NP  tamsulosin  (FLOMAX ) 0.4 MG CAPS capsule Take 0.4 mg by mouth at bedtime. 01/21/24   [provider]  Vitamin D , Ergocalciferol , (DRISDOL ) 1.25 MG (50000 UNIT) CAPS capsule TAKE 1 CAPSULE (50,000 UNITS TOTAL) BY MOUTH EVERY 7 (SEVEN) DAYS 05/06/22   Raulkar, Keven Pel, MD    Past Medical History:  Diagnosis Date   Arthritis    R shoulder, bone spur   Arthritis    "hands" (03/25/2016)   Chronic diastolic heart failure (HCC)    Chronic lower back pain    Coronary heart disease    Dr Ollie Bhat   Diverticulitis 12/2021   DVT (deep venous thrombosis) (HCC) 05/2022   left leg   H/O cardiovascular stress test    perhaps last one was 2009   Heart murmur    12/29/11 echo: mild MR, no AS, trivial TR   History of gout    Hyperlipidemia    Hypertension  saw Dr. Pauletta Boroughs last earl;y- 2014, cardiac cath. last done ?2009, blocks seen didn't require any intervention at that point.   Narcolepsy    [name] 04-11-97; Mean Latency 1.75min, SOREM 2   OSA on CPAP    NPSG 12-13-98 AHI 22.7   PONV (postoperative nausea and vomiting)    Presence of permanent cardiac pacemaker    Shortness of breath    with exertion    Stroke (HCC)    Type II diabetes mellitus (HCC)     Past Surgical History:  Procedure Laterality Date   CARDIAC CATHETERIZATION  2009?   EP IMPLANTABLE DEVICE N/A 03/25/2016   Procedure: Pacemaker Implant - Dual Chamber;  Surgeon: Tammie Fall, MD;  Location: [facility] INVASIVE CV LAB;  Service: Cardiovascular;  Laterality: N/A;   FRACTURE SURGERY     HIP FRACTURE SURGERY     INSERT / REPLACE / REMOVE PACEMAKER  03/24/2016   INTRAMEDULLARY (IM) NAIL INTERTROCHANTERIC Left 02/08/2022   Procedure: INTRAMEDULLARY (IM) NAIL INTERTROCHANTRIC;  Surgeon: Jasmine Mesi, MD;  Location: [facility] OR;  Service: Orthopedics;  Laterality: Left;   IR KYPHO LUMBAR INC FX REDUCE BONE BX UNI/BIL CANNULATION INC/IMAGING  12/29/2021   IR RADIOLOGIST EVAL & MGMT  12/15/2021   IR RADIOLOGY PERIPHERAL GUIDED IV START  07/14/2022   IR US  GUIDE VASC ACCESS RIGHT  07/15/2022   LEFT AND RIGHT HEART  CATHETERIZATION WITH CORONARY ANGIOGRAM N/A 06/05/2014   Procedure: LEFT AND RIGHT HEART CATHETERIZATION WITH CORONARY ANGIOGRAM;  Surgeon: Mickiel Albany, MD;  Location: [facility] CATH LAB;  Service: Cardiovascular;  Laterality: N/A;   NASAL FRACTURE SURGERY     SHOULDER ARTHROSCOPY WITH ROTATOR CUFF REPAIR AND SUBACROMIAL DECOMPRESSION Right 08/16/2013   Procedure: SHOULDER ARTHROSCOPY WITH ROTATOR CUFF REPAIR AND SUBACROMIAL DECOMPRESSION;  Surgeon: Derald Flattery, MD;  Location: [facility] OR;  Service: Orthopedics;  Laterality: Right;  Right shoulder arthroscopy rotator cuff repair, subacromial decompression.   TONSILLECTOMY       reports that he quit smoking about 56 years ago. His smoking use included cigarettes. He started smoking about 62 years ago. He has a 15 pack-year smoking history. He has never used smokeless tobacco. He reports current alcohol  use. He reports that he does not use drugs.  Family History  Problem Relation Age of Onset   Sleep apnea Sister    Colon cancer Sister      Physical Exam: Vitals:   03/21/24 1850 03/21/24 2145 03/21/24 2200 03/21/24 2300  BP:  (!) 168/86 (!) 164/91 (!) 161/90  Pulse:  77 60 71  Resp:  (!) 23 20 17   Temp: 98.5 F (36.9 C)   97.6 F (36.4 C)  TempSrc: Oral   Oral  SpO2:  93% 95% 98%  Weight:        Gen: Awake, alert, chronically ill-appearing CV: Regular, normal S1, S2, 1/6 SEM Resp: Normal WOB, rales in the bases  abd: Flat, normoactive, nontender MSK: Symmetric, no edema  Skin: No rashes or lesions to exposed skin  Neuro: Alert and interactive, oriented to person, Arlin Benes, May 2025, not to situation.  CN II through XII intact, motor is 5 out of 5 and symmetric, sensation is intact and equal to fine touch Psych: Appears Encephalopathic   Data review:   Labs reviewed, notable for:   K3.3 Blood glucose 190 Other chemistries unremarkable INR 2   Micro:  Results for orders placed or performed during the hospital  encounter of 02/25/24  Culture, blood (Routine  X 2) w Reflex to ID Panel     Status: None   Collection Time: 02/26/24 11:28 AM   Specimen: BLOOD LEFT HAND  Result Value Ref Range Status   Specimen Description BLOOD LEFT HAND  Final   Special Requests   Final    BOTTLES DRAWN AEROBIC AND ANAEROBIC Blood Culture adequate volume   Culture   Final    NO GROWTH 5 DAYS Performed at [facility], [address], [place], [place] [address]    Report Status 03/02/2024 FINAL  Final  Culture, blood (Routine X 2) w Reflex to ID Panel     Status: None   Collection Time: 02/26/24 11:35 AM   Specimen: BLOOD RIGHT HAND  Result Value Ref Range Status   Specimen Description BLOOD RIGHT HAND  Final   Special Requests   Final    BOTTLES DRAWN AEROBIC AND ANAEROBIC Blood Culture adequate volume   Culture   Final    NO GROWTH 5 DAYS Performed at [facility], [address] [place], [place] [address]    Report Status 03/02/2024 FINAL  Final    Imaging reviewed:  CT ANGIO HEAD NECK W WO CM (CODE STROKE) Result Date: 03/21/2024 CLINICAL DATA:  Neuro deficit, concern for stroke, right-sided weakness, slurred speech. EXAM: CT ANGIOGRAPHY HEAD AND NECK WITH AND WITHOUT CONTRAST TECHNIQUE: Multidetector CT imaging of the head and neck was performed using the standard protocol during bolus administration of intravenous contrast. Multiplanar CT image reconstructions and MIPs were obtained to evaluate the vascular anatomy. Carotid stenosis measurements (when applicable) are obtained utilizing NASCET criteria, using the distal internal carotid diameter as the denominator. RADIATION DOSE REDUCTION: This exam was performed according to the departmental dose-optimization program which includes automated exposure control, adjustment of the mA and/or kV according to patient size and/or use of iterative reconstruction technique. CONTRAST:  75mL OMNIPAQUE  IOHEXOL  350 MG/ML SOLN COMPARISON:  CT head earlier same  day. FINDINGS: CTA NECK FINDINGS Aortic arch: Standard configuration of the aortic arch. Imaged portion shows no evidence of aneurysm or dissection. Mild atherosclerosis along the aortic arch and visualized proximal descending thoracic aorta. No significant stenosis of the major arch vessel origins. Pulmonary arteries: As permitted by contrast timing, there are no filling defects in the visualized pulmonary arteries. Subclavian arteries: The subclavian arteries are patent bilaterally. Right carotid system: Atherosclerosis noted along the distal brachiocephalic artery without significant narrowing at the origin of the right common carotid artery. Right carotid artery is patent from the origin to the skull base. Calcified atherosclerosis at the carotid bifurcation resulting in approximately 30% stenosis. Left carotid system: Patent from the origin to the skull base. Calcified atherosclerosis at the carotid bifurcation without hemodynamically significant stenosis. Vertebral arteries: Codominant. No evidence of dissection, stenosis (50% or greater), or occlusion. Atherosclerosis of the bilateral V4 segments resulting in mild stenosis. Skeleton: No acute findings. Degenerative changes in the cervical spine. Disc space narrowing most pronounced at C4-5 and C5-6. Other neck: The visualized airway is patent. No cervical lymphadenopathy. Upper chest: Atelectasis in the dependent aspect of both lungs. Partially visualized left chest wall pacer device. Review of the MIP images confirms the above findings CTA HEAD FINDINGS ANTERIOR CIRCULATION: The intracranial ICAs are patent bilaterally. Atherosclerosis of the bilateral carotid siphons. Mild stenosis of the right supraclinoid ICA. No significant stenosis, proximal occlusion, aneurysm, or vascular malformation. MCAs: The middle cerebral arteries are patent bilaterally. ACAs: The anterior cerebral arteries are patent bilaterally. POSTERIOR CIRCULATION: No  significant stenosis,  proximal occlusion, aneurysm, or vascular malformation. PCAs: Patent bilaterally. Focal severe stenosis of the P2 segment right PCA Pcomm: Not well visualized. SCAs: The superior cerebellar arteries are patent bilaterally. Basilar artery: Patent AICAs: Visualized on the left. PICAs: Visualized on the right. Vertebral arteries: The intracranial vertebral arteries are patent. Venous sinuses: As permitted by contrast timing, patent. Anatomic variants: None Review of the MIP images confirms the above findings IMPRESSION: No large vessel occlusion. Focal severe stenosis of the P2 segment right PCA. Otherwise no high-grade stenosis identified. Atherosclerosis at the carotid bifurcations. 30% stenosis at the origin of the right cervical ICA. Mild stenosis of the right supraclinoid ICA. Additional mild stenosis of the V4 segments of the bilateral vertebral arteries. Aortic Atherosclerosis (ICD10-I70.0). Electronically Signed   By: Denny Flack M.D.   On: 03/21/2024 18:54   CT HEAD CODE STROKE WO CONTRAST Result Date: 03/21/2024 EXAM: CT HEAD WITHOUT 03/21/2024 05:33:57 PM TECHNIQUE: CT of the head was performed without the administration of intravenous contrast. Automated exposure control, iterative reconstruction, and/or weight based adjustment of the mA/kV was utilized to reduce the radiation dose to as low as reasonably achievable. COMPARISON: CT head without contrast 02/25/2024. CLINICAL HISTORY: Neuro deficit, acute, stroke suspected. R/S weakness, slurred speech. FINDINGS: BRAIN AND VENTRICLES: Remote infarct is noted in the anterior left frontal lobe. Remote lacunar infarcts are present in the cerebellum bilaterally. No acute infarct, hemorrhage, or mass lesion is present. Moderate atrophy and white matter changes bilaterally are stable. The ventricles are proportionate to the degree of atrophy. ASPECTS: . Ganglionic level: 7/7 Supraganglionic level:3/3. Total: 10/10 . Aaron Aas ORBITS: Lens replacements are noted  bilaterally. The globes and orbits are otherwise within normal limits. SINUSES: A posterior right ethmoid air cell is opacified. Anterior left ethmoid air cells and the left frontal sinus are opacified. Chronic wall thickening is noted in the left maxillary sinus. Minimal secretions are present in the left maxillary sinus. No fluid level is present. SOFT TISSUES AND SKULL: No acute abnormality of the visualized skull or soft tissues. VASCULATURE: Atherosclerotic calcifications are present within the cavernous internal carotid arteries bilaterally. No hyperdense vessel is present. IMPRESSION: 1. No acute intracranial abnormality. 2. Remote infarct in the anterior left frontal lobe and remote lacunar infarcts in the cerebellum bilaterally. 3. Stable moderate atrophy and white matter changes bilaterally. Key findings were texted via amion to Dr. Cleone Dad at 5:51pm Electronically signed by: Audree Leas MD 03/21/2024 05:52 PM EDT RP Workstation: [id number]   Personally reviewed CT head  ED Course:  Arrived as a prehospital code stroke.  Was seen by neurology.  Underwent CT head, CTA head and neck.  Not a candidate for TNK or other intervention.    Assessment/Plan:  83 y.o. male with hx Parkinson disease, CVA, HFpEF, pacemaker, history of DVT on Xarelto , type 2 diabetes mellitus, orthostatic hypotension, OSA on CPAP, and metastatic neuroendocrine tumor, recent admission to [facility] 4/26 - 5/2 for encephalopathy felt to be related to Parkinson's for medication effect from Seroquel . Who presented with R sided weakness, aphasia.   R sided weakness, aphasia - improved  Arrived as a prehospital code stroke.  Was seen by neurology.  Underwent CT head with no acute abnormality, remote infarcts noted. CTA head and neck with no LVO, incidental note of severe stenosis right P2, no other hemodynamically significant stenosis.  Not a candidate for TNK or other intervention.  Etiology suspected TIA.  -Neurology  following -MRI brain without contrast ordered, will need to be  arranged with pacemaker; if unable to undergo recommended for 12 to 24-hour CT head repeat. -Routine EEG - Antiplatelet pending MRI result; AC currently held per neuro rec pending repeat imaging.  - Permissive HTN <220/120  - Check lipids; recent A1c 4/27 was 6.8% - Serial neurochecks  - Telemonitoring - TTE with bubble - PT/OT/SLP - DVT prophylaxis per below - Swallow screen then [facility] diet   Chronic medical problems:  Parkinson disease, with hx hallucinations, behavioral change: Continue home Sinemet , seroquel  at bedtime  Hx CVA: See additional stroke/TIA eval above.  Continue home atorvastatin  HFpEF: Without acute exacerbation, not on diuretics. Hx sinus node dysfunction s/p pacemaker: Biotronik ppm.  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 Currently on observation.  history of DVT: on Xarelto , currently held pending repeat head imaging. Type 2 diabetes mellitus: Hold home oral agents, SSI for very sensitive for now. Orthostatic hypotension: Continue home midodrine  twice daily Daytime somnolence: Continue home modafinil  OSA: CPAP at bedtime     Body mass index is 30.8 kg/m.    DVT prophylaxis:  SCDs Code Status:  Full Code Diet:  Diet Orders (From admission, onward)     Start     Ordered   03/21/24 2102  Diet Heart Room service appropriate? Yes; Fluid consistency: Thin  Diet effective now       Comments: OK for diet if passes swallow screen  Question Answer Comment  Room service appropriate? Yes   Fluid consistency: Thin      03/21/24 2104           Family Communication:  Yes discussed with wife at bedside   Consults:  Neurology   Admission status:   Inpatient, Telemetry bed  Severity of Illness: The appropriate patient status for this patient is INPATIENT. Inpatient status is judged to be reasonable and necessary in order to provide the required  intensity of service to ensure the patient's safety. The patient's presenting symptoms, physical exam findings, and initial radiographic and laboratory data in the context of their chronic comorbidities is felt to place them at high risk for further clinical deterioration. Furthermore, it is not anticipated that the patient will be medically stable for discharge from the hospital within 2 midnights of admission.   * I certify that at the point of admission it is my clinical judgment that the patient will require inpatient hospital care spanning beyond 2 midnights from the point of admission due to high intensity of service, high risk for further deterioration and high frequency of surveillance required.*   Arnulfo Larch, MD [facility]  How to contact the [facility] Attending or Consulting provider 7A - 7P or covering provider during after hours 7P -7A, for this patient.  Check the care team in [facility] and look for a) attending/consulting [facility] provider listed and b) the [facility] team listed Log into [email or web address] and use [facility]'s universal password to access. If you do not have the password, please contact the hospital operator. Locate the [facility] provider you are looking for under [facility] and page to a number that you can be directly reached. If you still have difficulty reaching the provider, please page the [facility] (Director on Call) for the Hospitalists listed on amion for assistance.  03/21/2024, 11:28 PM

## 2024-03-21 NOTE — ED Triage Notes (Signed)
 PT BIB EMS from primary care's office, at 1415 patient was baseline per wife, and at the end of the appointment  patient was leaning to the right, not talking, and fatigue noted.

## 2024-03-21 NOTE — ED Notes (Signed)
 Report give to Nola Battiest, RN. Pt is resting comfortable on bed, wife is at the bedside.

## 2024-03-21 NOTE — ED Provider Notes (Signed)
 [place] EMERGENCY DEPARTMENT AT [facility] Provider Note   CSN: [id number] Arrival date & time: 03/21/24  1729  An emergency department physician performed an initial assessment on this suspected stroke patient at 1730.  History  Chief Complaint  Patient presents with   Code Stroke    Jordan [name] is a 83 y.o. male.  HPI Patient presented as a code stroke.  Met by myself and Dr. Cleone Dad [place] the [place].  Came from primary care's office.  Reportedly at baseline which is wheelchair-bound.  At the end of the appointment patient was not moving his right side.  Flaccid not talking.  Improved somewhat upon arrival by EMS.   Past Medical History:  Diagnosis Date   Arthritis    R shoulder, bone spur   Arthritis    "hands" (03/25/2016)   Chronic diastolic heart failure (HCC)    Chronic lower back pain    Coronary heart disease    Dr Ollie Bhat   Diverticulitis 12/2021   DVT (deep venous thrombosis) (HCC) 05/2022   left leg   H/O cardiovascular stress test    perhaps last one was 2009   Heart murmur    12/29/11 echo: mild MR, no AS, trivial TR   History of gout    Hyperlipidemia    Hypertension    saw Dr. Pauletta Boroughs last earl;y- 2014, cardiac cath. last done ?2009, blocks seen didn't require any intervention at that point.   Narcolepsy    [name] 04-11-97; Mean Latency 1.71min, SOREM 2   OSA on CPAP    NPSG 12-13-98 AHI 22.7   PONV (postoperative nausea and vomiting)    Presence of permanent cardiac pacemaker    Shortness of breath    with exertion    Stroke (HCC)    Type II diabetes mellitus (HCC)     Home Medications Prior to Admission medications   Medication Sig Start Date End Date Taking? Authorizing Provider  acetaminophen  (TYLENOL ) 500 MG tablet Take 500-1,000 mg by mouth every 6 (six) hours as needed for moderate pain or mild pain.    [provider]  atorvastatin  (LIPITOR) 40 MG tablet Take 0.5 tablets (20 mg total) by mouth daily. Patient taking  differently: Take 20 mg by mouth at bedtime. 07/30/21   Medina-Vargas, Monina C, NP  b complex vitamins capsule Take 1 capsule by mouth at bedtime.    [provider]  bifidobacterium infantis (ALIGN) capsule Take 1 capsule by mouth daily.    [provider]  Camphor-Menthol-Methyl Sal (HM SALONPAS PAIN RELIEF EX) Apply 1 patch topically daily as needed (Back pain).    [provider]  carbidopa -levodopa  (SINEMET  CR) 50-200 MG tablet Take 1 tablet by mouth 4 (four) times daily. 02/09/24   Johny Nap, NP  Cholecalciferol  (VITAMIN D ) 50 MCG (2000 UT) CAPS Take 2,000 Units by mouth at bedtime.    [provider]  clotrimazole-betamethasone (LOTRISONE) cream Apply 1 Application topically daily as needed (Rash). 06/21/22   [provider]  docusate sodium  (COLACE) 100 MG capsule Take 1 capsule (100 mg total) by mouth 2 (two) times daily. Patient taking differently: Take 100 mg by mouth daily as needed for mild constipation or moderate constipation. As Needed 02/17/22   Akula, Vijaya, MD  feeding supplement, GLUCERNA SHAKE, (GLUCERNA SHAKE) LIQD Take 237 mLs by mouth 2 (two) times daily between meals. Alternate with ensure Diabetic    [provider]  lidocaine  (LIDODERM ) 5 % Place 1 patch onto  the skin daily as needed. Purchase over the counter. On for 12 hours and off for 12 hours 01/18/22   Love, Renay Carota, PA-C  Melatonin 10 MG TABS Take 10 mg by mouth at bedtime.    [provider]  metFORMIN  (GLUCOPHAGE ) 500 MG tablet Take 1,000 mg by mouth 2 (two) times daily. 1000 mg in the am and 1000 mg at night    [provider]  Methylcobalamin 5000 MCG CHEW Chew 500 mcg by mouth at bedtime.    [provider]  midodrine  (PROAMATINE ) 5 MG tablet Take 1 tablet (5 mg total) by mouth 2 (two) times daily with a meal. HOLD IF SBP >130 03/02/24   Ghimire, Estil Heman, MD  modafinil  (PROVIGIL ) 200 MG tablet TAKE 1 TABLET BY MOUTH EVERY DAY  12/12/23   Rosa College D, MD  Multiple Vitamin (MULTIVITAMIN WITH MINERALS) TABS tablet Take 1 tablet by mouth daily. 02/19/18   Barbee Lew, MD  NITROSTAT  0.4 MG SL tablet PLACE 1 TABLET (0.4 MG TOTAL) UNDER THE TONGUE EVERY 5 (FIVE) MINUTES AS NEEDED FOR CHEST PAIN. Patient not taking: Reported on 02/25/2024 03/31/16   Arty Binning, MD  Omega-3 Fatty Acids (OMEGA 3 PO) Take 1,280 mg by mouth at bedtime.    [provider]  OVER THE COUNTER MEDICATION Take 72 mg by mouth in the morning and at bedtime. Ferrasorb    [provider]  polyethylene glycol (MIRALAX  / GLYCOLAX ) 17 g packet Take 17 g by mouth daily. Patient taking differently: Take 17 g by mouth daily as needed for mild constipation. 01/09/22   Lesa Rape, MD  polyvinyl alcohol  (LIQUIFILM TEARS) 1.4 % ophthalmic solution Place 1 drop into both eyes as needed for dry eyes. Patient taking differently: Place 1 drop into both eyes daily as needed for dry eyes. 01/18/22   Love, Renay Carota, PA-C  QUEtiapine  (SEROQUEL ) 25 MG tablet Take 1 tablet (25 mg total) by mouth every evening. TAKE AT 5 PM 03/02/24   Ghimire, Estil Heman, MD  rivaroxaban  (XARELTO ) 20 MG TABS tablet Take 1 tablet (20 mg total) by mouth daily. 01/02/24   Boscia, Heather E, NP  tamsulosin  (FLOMAX ) 0.4 MG CAPS capsule Take 0.4 mg by mouth at bedtime. 01/21/24   [provider]  Vitamin D , Ergocalciferol , (DRISDOL ) 1.25 MG (50000 UNIT) CAPS capsule TAKE 1 CAPSULE (50,000 UNITS TOTAL) BY MOUTH EVERY 7 (SEVEN) DAYS 05/06/22   Raulkar, Keven Pel, MD      Allergies    Lisinopril , Amantadines, Clonazepam , and Zoloft  [sertraline ]    Review of Systems   Review of Systems  Physical Exam Updated Vital Signs BP (!) 161/77   Pulse 61   Temp 98.5 F (36.9 C) (Oral)   Resp 20   Wt 94.6 kg   SpO2 95%   BMI 30.80 kg/m  Physical Exam Vitals and nursing note reviewed.  Cardiovascular:     Rate and Rhythm: Regular rhythm.  Pulmonary:     Breath sounds:  No wheezing.  Abdominal:     Tenderness: There is no abdominal tenderness.  Neurological:     Mental Status: He is alert.     Comments: Complete NIH scoring done by neurology.  Right-sided facial droop.  Was weaker on right compared to left.  Did have some difficulty speaking.  Some confusion.  Did have a left gaze preference but could look to the right.   There was some waxing and waning of the neurologic findings during his  stay.  ED Results / Procedures / Treatments   Labs (all labs ordered are listed, but only abnormal results are displayed) Labs Reviewed  PROTIME-INR - Abnormal; Notable for the following components:      Result Value   Prothrombin Time 22.8 (*)    INR 2.0 (*)    All other components within normal limits  CBC - Abnormal; Notable for the following components:   Hemoglobin 12.3 (*)    HCT 38.6 (*)    RDW 19.3 (*)    All other components within normal limits  COMPREHENSIVE METABOLIC PANEL WITH GFR - Abnormal; Notable for the following components:   Potassium 3.3 (*)    Glucose, Bld 194 (*)    Albumin 3.2 (*)    All other components within normal limits  I-STAT CHEM 8, ED - Abnormal; Notable for the following components:   Potassium 3.4 (*)    Glucose, Bld 195 (*)    Hemoglobin 12.6 (*)    HCT 37.0 (*)    All other components within normal limits  CBG MONITORING, ED - Abnormal; Notable for the following components:   Glucose-Capillary 180 (*)    All other components within normal limits  ETHANOL  APTT  DIFFERENTIAL  RAPID URINE DRUG SCREEN, HOSP PERFORMED  LIPID PANEL  URINALYSIS, ROUTINE W REFLEX MICROSCOPIC    EKG None  Radiology CT ANGIO HEAD NECK W WO CM (CODE STROKE) Result Date: 03/21/2024 CLINICAL DATA:  Neuro deficit, concern for stroke, right-sided weakness, slurred speech. EXAM: CT ANGIOGRAPHY HEAD AND NECK WITH AND WITHOUT CONTRAST TECHNIQUE: Multidetector CT imaging of the head and neck was performed using the standard protocol during  bolus administration of intravenous contrast. Multiplanar CT image reconstructions and MIPs were obtained to evaluate the vascular anatomy. Carotid stenosis measurements (when applicable) are obtained utilizing NASCET criteria, using the distal internal carotid diameter as the denominator. RADIATION DOSE REDUCTION: This exam was performed according to the departmental dose-optimization program which includes automated exposure control, adjustment of the mA and/or kV according to patient size and/or use of iterative reconstruction technique. CONTRAST:  75mL OMNIPAQUE  IOHEXOL  350 MG/ML SOLN COMPARISON:  CT head earlier same day. FINDINGS: CTA NECK FINDINGS Aortic arch: Standard configuration of the aortic arch. Imaged portion shows no evidence of aneurysm or dissection. Mild atherosclerosis along the aortic arch and visualized proximal descending thoracic aorta. No significant stenosis of the major arch vessel origins. Pulmonary arteries: As permitted by contrast timing, there are no filling defects in the visualized pulmonary arteries. Subclavian arteries: The subclavian arteries are patent bilaterally. Right carotid system: Atherosclerosis noted along the distal brachiocephalic artery without significant narrowing at the origin of the right common carotid artery. Right carotid artery is patent from the origin to the skull base. Calcified atherosclerosis at the carotid bifurcation resulting in approximately 30% stenosis. Left carotid system: Patent from the origin to the skull base. Calcified atherosclerosis at the carotid bifurcation without hemodynamically significant stenosis. Vertebral arteries: Codominant. No evidence of dissection, stenosis (50% or greater), or occlusion. Atherosclerosis of the bilateral V4 segments resulting in mild stenosis. Skeleton: No acute findings. Degenerative changes in the cervical spine. Disc space narrowing most pronounced at C4-5 and C5-6. Other neck: The visualized airway is  patent. No cervical lymphadenopathy. Upper chest: Atelectasis in the dependent aspect of both lungs. Partially visualized left chest wall pacer device. Review of the MIP images confirms the above findings CTA HEAD FINDINGS ANTERIOR CIRCULATION: The intracranial ICAs are patent bilaterally. Atherosclerosis of the bilateral carotid  siphons. Mild stenosis of the right supraclinoid ICA. No significant stenosis, proximal occlusion, aneurysm, or vascular malformation. MCAs: The middle cerebral arteries are patent bilaterally. ACAs: The anterior cerebral arteries are patent bilaterally. POSTERIOR CIRCULATION: No significant stenosis, proximal occlusion, aneurysm, or vascular malformation. PCAs: Patent bilaterally. Focal severe stenosis of the P2 segment right PCA Pcomm: Not well visualized. SCAs: The superior cerebellar arteries are patent bilaterally. Basilar artery: Patent AICAs: Visualized on the left. PICAs: Visualized on the right. Vertebral arteries: The intracranial vertebral arteries are patent. Venous sinuses: As permitted by contrast timing, patent. Anatomic variants: None Review of the MIP images confirms the above findings IMPRESSION: No large vessel occlusion. Focal severe stenosis of the P2 segment right PCA. Otherwise no high-grade stenosis identified. Atherosclerosis at the carotid bifurcations. 30% stenosis at the origin of the right cervical ICA. Mild stenosis of the right supraclinoid ICA. Additional mild stenosis of the V4 segments of the bilateral vertebral arteries. Aortic Atherosclerosis (ICD10-I70.0). Electronically Signed   By: Denny Flack M.D.   On: 03/21/2024 18:54   CT HEAD CODE STROKE WO CONTRAST Result Date: 03/21/2024 EXAM: CT HEAD WITHOUT 03/21/2024 05:33:57 PM TECHNIQUE: CT of the head was performed without the administration of intravenous contrast. Automated exposure control, iterative reconstruction, and/or weight based adjustment of the mA/kV was utilized to reduce the radiation  dose to as low as reasonably achievable. COMPARISON: CT head without contrast 02/25/2024. CLINICAL HISTORY: Neuro deficit, acute, stroke suspected. R/S weakness, slurred speech. FINDINGS: BRAIN AND VENTRICLES: Remote infarct is noted in the anterior left frontal lobe. Remote lacunar infarcts are present in the cerebellum bilaterally. No acute infarct, hemorrhage, or mass lesion is present. Moderate atrophy and white matter changes bilaterally are stable. The ventricles are proportionate to the degree of atrophy. ASPECTS: . Ganglionic level: 7/7 Supraganglionic level:3/3. Total: 10/10 . Aaron Aas ORBITS: Lens replacements are noted bilaterally. The globes and orbits are otherwise within normal limits. SINUSES: A posterior right ethmoid air cell is opacified. Anterior left ethmoid air cells and the left frontal sinus are opacified. Chronic wall thickening is noted in the left maxillary sinus. Minimal secretions are present in the left maxillary sinus. No fluid level is present. SOFT TISSUES AND SKULL: No acute abnormality of the visualized skull or soft tissues. VASCULATURE: Atherosclerotic calcifications are present within the cavernous internal carotid arteries bilaterally. No hyperdense vessel is present. IMPRESSION: 1. No acute intracranial abnormality. 2. Remote infarct in the anterior left frontal lobe and remote lacunar infarcts in the cerebellum bilaterally. 3. Stable moderate atrophy and white matter changes bilaterally. Key findings were texted via amion to Dr. Cleone Dad at 5:51pm Electronically signed by: Audree Leas MD 03/21/2024 05:52 PM EDT RP Workstation: [id number]    Procedures Procedures    Medications Ordered in ED Medications   stroke: early stages of recovery book (has no administration in time range)  iohexol  (OMNIPAQUE ) 350 MG/ML injection 75 mL (75 mLs Intravenous Contrast Given 03/21/24 1833)    ED Course/ Medical Decision Making/ A&P                                 Medical  Decision Making Amount and/or Complexity of Data Reviewed Labs: ordered. Radiology: ordered.   Patient with mental status change.  Came in as a code stroke.  Did have some lateralizing deficits.  Not a TNK candidate due to being on anticoagulation.  No definite LVO findings and no large vessel occlusion on CTA.  Did have some waxing and waning of deficits.  Has pacemaker but has had MRI previously.  Will be admitted to the hospital for mental status change.  Has had previous episodes in the past.  Also considered seizures.  Will admit to hospitalist.  CRITICAL CARE Performed by: Mozell Arias Total critical care time: 30 minutes Critical care time was exclusive of separately billable procedures and treating other patients. Critical care was necessary to treat or prevent imminent or life-threatening deterioration. Critical care was time spent personally by me on the following activities: development of treatment plan with patient and/or surrogate as well as nursing, discussions with consultants, evaluation of patient's response to treatment, examination of patient, obtaining history from patient or surrogate, ordering and performing treatments and interventions, ordering and review of laboratory studies, ordering and review of radiographic studies, pulse oximetry and re-evaluation of patient's condition.         Final Clinical Impression(s) / ED Diagnoses Final diagnoses:  Encephalopathy, unspecified type  Focal neurological deficit    Rx / DC Orders ED Discharge Orders     None         Mozell Arias, MD 03/21/24 2044

## 2024-03-21 NOTE — Code Documentation (Signed)
 Stroke Response Nurse Documentation Code Documentation  Jordan [name] is a 83 y.o. male arriving to [facility]  via [facility] on 03/21/2024 with past medical hx of Parkinson's, previous stroke, neuroendocrine tumor, DVT, DM, pacemaker, OSA. On Xarelto  (rivaroxaban ) daily. Code stroke was activated by EMS.   Patient from Doctor's office where he was LKW at 1415 and now complaining of right sided weakness, dysarthria.  Stroke team at the bedside on patient arrival. Labs drawn and patient cleared for CT by Dr. Val Garin. Patient to CT with team. NIHSS 16, see documentation for details and code stroke times. Patient with disoriented, not following commands, left gaze preference , right facial droop, bilateral arm weakness, bilateral leg weakness, Expressive aphasia , and dysarthria  on exam. The following imaging was completed:  CT Head and CTA. Patient is not a candidate for IV Thrombolytic due to Xarelto . Patient is not a candidate for IR due to no LVO on imaging.  Care Plan:       q 2 NIHSS and BP x 12, then q 4.      NPO until stroke swallow screen complete.   Process Delays Noted: difficulty obtaining IV access  Bedside handoff with ED RN Ethyl Hering.    Sheehan Stacey Livengood  Stroke Response RN

## 2024-03-21 NOTE — Consult Note (Signed)
 NEUROLOGY CONSULT NOTE   Date of service: Mar 21, 2024 Patient Name: Jordan [name] MRN:  [id number] DOB:  April 16, 1941 Chief Complaint: "Code Stroke" Requesting Provider: No att. providers found  History of Present Illness  Jordan [name] is a 83 y.o. male with hx of Parkinson disease, history of DVT on Xarelto , type 2 diabetes mellitus, OSA on CPAP, and metastatic neuroendocrine tumor currently under observation who presents from his primary care office. He was in his usual state of health when he went to his appointment at 1400 and when they tried to get him in his wheel chair at 1415 he went flaccid on the right side and had decreased responsiveness.   He was discharged from the hospital on 5/2 to a rehab facility that he was also discharged from on 5/20. He had a hospital discharge follow up appointment at his PCP office today. He was admitted from 4/27-5/2 for metabolic encephalopathy, fatigue, confusion, loss of appetite. His parkinson's and parkinson's psychosis medications were adjusted during that hospitalization. He is currently on seroquel  25mg  HS and sinemet  50/200 four times per day. He currently follows with [facility] and has been referred to both [facility] and [facility] movement disorder clinics.   LKW: 1415 Modified rankin score: 4-Needs assistance to walk and tend to bodily needs IV Thrombolysis: No, compliant with xarelto  EVT: No, no LVO  NIHSS components Score: Comment  1a Level of Conscious 0[x]  1[]  2[]  3[]      1b LOC Questions 0[]  1[]  2[x]       1c LOC Commands 0[]  1[x]  2[]       2 Best Gaze 0[]  1[x]  2[]     Preference to the left  3 Visual 0[x]  1[]  2[]  3[]      4 Facial Palsy 0[]  1[x]  2[]  3[]    Right facial droop  5a Motor Arm - left 0[]  1[x]  2[]  3[]  4[]  UN[]    5b Motor Arm - Right 0[]  1[]  2[x]  3[]  4[]  UN[]    6a Motor Leg - Left 0[]  1[]  2[x]  3[]  4[]  UN[]    6b Motor Leg - Right 0[]  1[]  2[]  3[x]  4[]  UN[]    7 Limb Ataxia 0[x]  1[]  2[]  UN[]      8 Sensory 0[]  1[x]  2[]  UN[]      9 Best  Language 0[]  1[x]  2[]  3[]      10 Dysarthria 0[]  1[x]  2[]  UN[]      11 Extinct. and Inattention 0[x]  1[]  2[]       TOTAL:16       ROS  Comprehensive ROS performed and pertinent positives documented in HPI   Past History   Past Medical History:  Diagnosis Date   Arthritis    R shoulder, bone spur   Arthritis    "hands" (03/25/2016)   Chronic diastolic heart failure (HCC)    Chronic lower back pain    Coronary heart disease    Dr Ollie Bhat   Diverticulitis 12/2021   DVT (deep venous thrombosis) (HCC) 05/2022   left leg   H/O cardiovascular stress test    perhaps last one was 2009   Heart murmur    12/29/11 echo: mild MR, no AS, trivial TR   History of gout    Hyperlipidemia    Hypertension    saw Dr. Pauletta Boroughs last earl;y- 2014, cardiac cath. last done ?2009, blocks seen didn't require any intervention at that point.   Narcolepsy    [name] 04-11-97; Mean Latency 1.38min, SOREM 2   OSA on CPAP    NPSG 12-13-98 AHI 22.7  PONV (postoperative nausea and vomiting)    Presence of permanent cardiac pacemaker    Shortness of breath    with exertion    Stroke (HCC)    Type II diabetes mellitus [facility])     Past Surgical History:  Procedure Laterality Date   CARDIAC CATHETERIZATION  2009?   EP IMPLANTABLE DEVICE N/A 03/25/2016   Procedure: Pacemaker Implant - Dual Chamber;  Surgeon: Tammie Fall, MD;  Location: [facility] INVASIVE CV LAB;  Service: Cardiovascular;  Laterality: N/A;   FRACTURE SURGERY     HIP FRACTURE SURGERY     INSERT / REPLACE / REMOVE PACEMAKER  03/24/2016   INTRAMEDULLARY (IM) NAIL INTERTROCHANTERIC Left 02/08/2022   Procedure: INTRAMEDULLARY (IM) NAIL INTERTROCHANTRIC;  Surgeon: Jasmine Mesi, MD;  Location: [facility] OR;  Service: Orthopedics;  Laterality: Left;   IR KYPHO LUMBAR INC FX REDUCE BONE BX UNI/BIL CANNULATION INC/IMAGING  12/29/2021   IR RADIOLOGIST EVAL & MGMT  12/15/2021   IR RADIOLOGY PERIPHERAL GUIDED IV START  07/14/2022   IR US  GUIDE VASC ACCESS  RIGHT  07/15/2022   LEFT AND RIGHT HEART CATHETERIZATION WITH CORONARY ANGIOGRAM N/A 06/05/2014   Procedure: LEFT AND RIGHT HEART CATHETERIZATION WITH CORONARY ANGIOGRAM;  Surgeon: Mickiel Albany, MD;  Location: [facility] CATH LAB;  Service: Cardiovascular;  Laterality: N/A;   NASAL FRACTURE SURGERY     SHOULDER ARTHROSCOPY WITH ROTATOR CUFF REPAIR AND SUBACROMIAL DECOMPRESSION Right 08/16/2013   Procedure: SHOULDER ARTHROSCOPY WITH ROTATOR CUFF REPAIR AND SUBACROMIAL DECOMPRESSION;  Surgeon: Derald Flattery, MD;  Location: [facility] OR;  Service: Orthopedics;  Laterality: Right;  Right shoulder arthroscopy rotator cuff repair, subacromial decompression.   TONSILLECTOMY      Family History: Family History  Problem Relation Age of Onset   Sleep apnea Sister    Colon cancer Sister     Social History  reports that he quit smoking about 56 years ago. His smoking use included cigarettes. He started smoking about 62 years ago. He has a 15 pack-year smoking history. He has never used smokeless tobacco. He reports current alcohol  use. He reports that he does not use drugs.  Allergies  Allergen Reactions   Lisinopril      Unknown reaction   Amantadines Other (See Comments)    Mental changes   Clonazepam  Other (See Comments)    Mental changes   Zoloft  [Sertraline ] Other (See Comments)    Mental changes    Medications  No current facility-administered medications for this encounter.  Current Outpatient Medications:    acetaminophen  (TYLENOL ) 500 MG tablet, Take 500-1,000 mg by mouth every 6 (six) hours as needed for moderate pain or mild pain., Disp: , Rfl:    atorvastatin  (LIPITOR) 40 MG tablet, Take 0.5 tablets (20 mg total) by mouth daily. (Patient taking differently: Take 20 mg by mouth at bedtime.), Disp: 30 tablet, Rfl: 0   b complex vitamins capsule, Take 1 capsule by mouth at bedtime., Disp: , Rfl:    bifidobacterium infantis (ALIGN) capsule, Take 1 capsule by mouth daily., Disp: ,  Rfl:    Camphor-Menthol-Methyl Sal (HM SALONPAS PAIN RELIEF EX), Apply 1 patch topically daily as needed (Back pain)., Disp: , Rfl:    carbidopa -levodopa  (SINEMET  CR) 50-200 MG tablet, Take 1 tablet by mouth 4 (four) times daily., Disp: 120 tablet, Rfl: 11   Cholecalciferol  (VITAMIN D ) 50 MCG (2000 UT) CAPS, Take 2,000 Units by mouth at bedtime., Disp: , Rfl:    clotrimazole-betamethasone (LOTRISONE) cream, Apply 1 Application topically daily  as needed (Rash)., Disp: , Rfl:    docusate sodium  (COLACE) 100 MG capsule, Take 1 capsule (100 mg total) by mouth 2 (two) times daily. (Patient taking differently: Take 100 mg by mouth daily as needed for mild constipation or moderate constipation. As Needed), Disp: 10 capsule, Rfl: 0   feeding supplement, GLUCERNA SHAKE, (GLUCERNA SHAKE) LIQD, Take 237 mLs by mouth 2 (two) times daily between meals. Alternate with ensure Diabetic, Disp: , Rfl:    lidocaine  (LIDODERM ) 5 %, Place 1 patch onto the skin daily as needed. Purchase over the counter. On for 12 hours and off for 12 hours, Disp: 30 patch, Rfl: 0   Melatonin 10 MG TABS, Take 10 mg by mouth at bedtime., Disp: , Rfl:    metFORMIN  (GLUCOPHAGE ) 500 MG tablet, Take 1,000 mg by mouth 2 (two) times daily. 1000 mg in the am and 1000 mg at night, Disp: , Rfl:    Methylcobalamin 5000 MCG CHEW, Chew 500 mcg by mouth at bedtime., Disp: , Rfl:    midodrine  (PROAMATINE ) 5 MG tablet, Take 1 tablet (5 mg total) by mouth 2 (two) times daily with a meal. HOLD IF SBP >130, Disp: , Rfl:    modafinil  (PROVIGIL ) 200 MG tablet, TAKE 1 TABLET BY MOUTH EVERY DAY, Disp: 30 tablet, Rfl: 5   Multiple Vitamin (MULTIVITAMIN WITH MINERALS) TABS tablet, Take 1 tablet by mouth daily., Disp: , Rfl:    NITROSTAT  0.4 MG SL tablet, PLACE 1 TABLET (0.4 MG TOTAL) UNDER THE TONGUE EVERY 5 (FIVE) MINUTES AS NEEDED FOR CHEST PAIN. (Patient not taking: Reported on 02/25/2024), Disp: 25 tablet, Rfl: 5   Omega-3 Fatty Acids (OMEGA 3 PO), Take 1,280  mg by mouth at bedtime., Disp: , Rfl:    OVER THE COUNTER MEDICATION, Take 72 mg by mouth in the morning and at bedtime. Ferrasorb, Disp: , Rfl:    polyethylene glycol (MIRALAX  / GLYCOLAX ) 17 g packet, Take 17 g by mouth daily. (Patient taking differently: Take 17 g by mouth daily as needed for mild constipation.), Disp: 14 each, Rfl: 0   polyvinyl alcohol  (LIQUIFILM TEARS) 1.4 % ophthalmic solution, Place 1 drop into both eyes as needed for dry eyes. (Patient taking differently: Place 1 drop into both eyes daily as needed for dry eyes.), Disp: 15 mL, Rfl: 0   QUEtiapine  (SEROQUEL ) 25 MG tablet, Take 1 tablet (25 mg total) by mouth every evening. TAKE AT 5 PM, Disp: , Rfl:    rivaroxaban  (XARELTO ) 20 MG TABS tablet, Take 1 tablet (20 mg total) by mouth daily., Disp: 30 tablet, Rfl: 1   tamsulosin  (FLOMAX ) 0.4 MG CAPS capsule, Take 0.4 mg by mouth at bedtime., Disp: , Rfl:    Vitamin D , Ergocalciferol , (DRISDOL ) 1.25 MG (50000 UNIT) CAPS capsule, TAKE 1 CAPSULE (50,000 UNITS TOTAL) BY MOUTH EVERY 7 (SEVEN) DAYS, Disp: 5 capsule, Rfl: 0  Vitals  There were no vitals filed for this visit.  There is no height or weight on file to calculate BMI.  Physical Exam   Constitutional: Ill appearing Psych: Flat affect Eyes: No scleral injection.  HENT: No OP obstruction.  Head: Normocephalic.  Cardiovascular: Normal rate and regular rhythm.  Respiratory: Effort normal, non-labored breathing.  GI: Soft.  No distension. There is no tenderness.  Skin: WDI.   Neurologic Examination   Neuro: Mental Status: Patient is awake, with eyes open. Not answering questions appropriately Speech seems slurred. More responsive towards the end of the exam- identifies objects Right left confusion  also limits testing Cranial Nerves: II: Visual Fields are full. Pupils are equal, round, and reactive to light.   III,IV, VI: EOMI without ptosis or diploplia.  V: Facial sensation is symmetric to temperature VII: Facial  movement is symmetric resting and smiling VIII: Hearing is intact to voice X: Palate elevates symmetrically XI: Shoulder shrug is symmetric. XII: Tongue protrudes midline without atrophy or fasciculations.  Motor: Tone is normal. Bulk is normal. RUE 3-4/5 with drift  LUE 4/5 RLE  2/5   LLE 4/5 Sensory: Appears to have less response on the right to noxious stimuli Cerebellar: Unable to complete   Labs/Imaging/Neurodiagnostic studies   CBC:  Recent Labs  Lab 03/27/24 1731 Mar 27, 2024 1737  WBC 7.9  --   NEUTROABS 4.7  --   HGB 12.3* 12.6*  HCT 38.6* 37.0*  MCV 87.7  --   PLT 219  --    Basic Metabolic Panel:  Lab Results  Component Value Date   NA 137 02/28/2024   K 3.6 02/28/2024   CO2 25 02/28/2024   GLUCOSE 132 (H) 02/28/2024   BUN 20 02/28/2024   CREATININE 1.06 02/28/2024   CALCIUM  9.3 02/28/2024   GFRNONAA >60 02/28/2024   GFRAA >90 07/23/2021   Lipid Panel:  Lab Results  Component Value Date   LDLCALC 51 04/29/2022   HgbA1c:  Lab Results  Component Value Date   HGBA1C 6.8 (H) 02/26/2024   Urine Drug Screen:     Component Value Date/Time   LABOPIA NONE DETECTED 04/14/2020 1700   COCAINSCRNUR NONE DETECTED 04/14/2020 1700   LABBENZ NONE DETECTED 04/14/2020 1700   AMPHETMU NONE DETECTED 04/14/2020 1700   THCU NONE DETECTED 04/14/2020 1700   LABBARB NONE DETECTED 04/14/2020 1700    Alcohol  Level     Component Value Date/Time   ETH <15 02/25/2024 2015   INR  Lab Results  Component Value Date   INR 1.4 (H) 12/22/2023   APTT  Lab Results  Component Value Date   APTT 32 12/22/2023   AED levels: No results found for: "PHENYTOIN", "ZONISAMIDE", "LAMOTRIGINE", "LEVETIRACETA"  CT Head without contrast(Personally reviewed): 1. No acute intracranial abnormality. 2. Remote infarct in the anterior left frontal lobe and remote lacunar infarcts in the cerebellum bilaterally. 3. Stable moderate atrophy and white matter changes bilaterally  CT angio  Head and Neck with contrast(Personally reviewed): No large vessel occlusion. Focal severe stenosis of the P2 segment right PCA. Otherwise no high-grade stenosis identified. Atherosclerosis at the carotid bifurcations. 30% stenosis at the origin of the right cervical ICA. Mild stenosis of the right supraclinoid ICA. Additional mild stenosis of the V4 segments of the bilateral vertebral arteries. Aortic Atherosclerosis (ICD10-I70.0).  MRI Brain(Personally reviewed): Pending   Neurodiagnostics rEEG:  Pending   ASSESSMENT   Jordan [name] is a 83 y.o. male with a past medical history parkinson disease, history of DVT on Xarelto , type 2 diabetes mellitus, OSA on CPAP, and metastatic neuroendocrine tumor currently under observation who presents from his primary care office as a code stroke.  His symptoms fluctuated throughout the exam with improvement towards the end of imaging.  He is still having a left gaze preference with right-sided weakness.  His speech has improved.  Plan for MRI tomorrow if his pacemaker is compatible.  This will need clarification from the MRI techs as it is unclear if he is compatible or not.  Additionally we will do a routine EEG  Differential - Stroke/TIA despite anticoagulation  - Cataplectic attack (history  of narcolepsy though extremely rarely would this be unilateral) - Less likely focal seizure  RECOMMENDATIONS  - MRI brain wo if able - he has had MRIs since his pacemaker placement in 2017 but since then there are now more recent notes saying it is not compatible.  Biotronik placed 03/25/2016, clarify MRI compatibility - If unable to obtain MRI, please order 12 to 24 hour repeat CT head - HgbA1c, fasting lipid panel - Routine EEG - Frequent neuro checks - Echocardiogram - Hold Xarelto  pending MRI / repeat head CT - Permissive hypertension for now - Risk factor modification - Telemetry monitoring - PT consult, OT consult, Speech consult - Stroke team  to follow _____________________________________________________________________  Signed, Imogene Mana, NP [facility]  Baldwin Levee MD-PhD [facility] [phone] Available 7 AM to 7 PM, outside these hours please contact Neurologist on call listed on AMION   CRITICAL CARE Performed by: Ronnette Coke   Total critical care time: 60 minutes  Critical care time was exclusive of separately billable procedures and treating other patients.  Critical care was necessary to treat or prevent imminent or life-threatening deterioration -- emergent evaluation for consideration of thrombectomy / thrombolytic   Critical care was time spent personally by me on the following activities: development of treatment plan with patient and/or surrogate as well as nursing, discussions with consultants, evaluation of patient's response to treatment, examination of patient, obtaining history from patient or surrogate, ordering and performing treatments and interventions, ordering and review of laboratory studies, ordering and review of radiographic studies, pulse oximetry and re-evaluation of patient's condition.

## 2024-03-21 NOTE — Progress Notes (Signed)
 EEG complete - results pending

## 2024-03-22 ENCOUNTER — Inpatient Hospital Stay (HOSPITAL_COMMUNITY)

## 2024-03-22 DIAGNOSIS — G459 Transient cerebral ischemic attack, unspecified: Secondary | ICD-10-CM | POA: Diagnosis not present

## 2024-03-22 DIAGNOSIS — R5381 Other malaise: Secondary | ICD-10-CM

## 2024-03-22 DIAGNOSIS — E1151 Type 2 diabetes mellitus with diabetic peripheral angiopathy without gangrene: Secondary | ICD-10-CM

## 2024-03-22 DIAGNOSIS — G20A1 Parkinson's disease without dyskinesia, without mention of fluctuations: Secondary | ICD-10-CM

## 2024-03-22 DIAGNOSIS — R569 Unspecified convulsions: Secondary | ICD-10-CM | POA: Diagnosis not present

## 2024-03-22 DIAGNOSIS — E785 Hyperlipidemia, unspecified: Secondary | ICD-10-CM

## 2024-03-22 DIAGNOSIS — I951 Orthostatic hypotension: Secondary | ICD-10-CM

## 2024-03-22 DIAGNOSIS — I69391 Dysphagia following cerebral infarction: Secondary | ICD-10-CM

## 2024-03-22 DIAGNOSIS — I2510 Atherosclerotic heart disease of native coronary artery without angina pectoris: Secondary | ICD-10-CM

## 2024-03-22 DIAGNOSIS — Z86718 Personal history of other venous thrombosis and embolism: Secondary | ICD-10-CM

## 2024-03-22 DIAGNOSIS — I509 Heart failure, unspecified: Secondary | ICD-10-CM

## 2024-03-22 DIAGNOSIS — Z950 Presence of cardiac pacemaker: Secondary | ICD-10-CM

## 2024-03-22 DIAGNOSIS — Z7901 Long term (current) use of anticoagulants: Secondary | ICD-10-CM

## 2024-03-22 LAB — ECHOCARDIOGRAM COMPLETE
AR max vel: 2.47 cm2
AV Area VTI: 3.3 cm2
AV Area mean vel: 2.5 cm2
AV Mean grad: 2 mmHg
AV Peak grad: 4.8 mmHg
Ao pk vel: 1.09 m/s
Area-P 1/2: 2.1 cm2
Calc EF: 67.6 %
MV VTI: 2.09 cm2
S' Lateral: 3.3 cm
Single Plane A2C EF: 73.7 %
Single Plane A4C EF: 65.8 %
Weight: 3336.88 [oz_av]

## 2024-03-22 LAB — CBG MONITORING, ED
Glucose-Capillary: 139 mg/dL — ABNORMAL HIGH (ref 70–99)
Glucose-Capillary: 170 mg/dL — ABNORMAL HIGH (ref 70–99)

## 2024-03-22 LAB — LIPID PANEL
Cholesterol: 114 mg/dL (ref 0–200)
HDL: 39 mg/dL — ABNORMAL LOW (ref >40)
LDL Cholesterol: 55 mg/dL (ref 0–99)
Total CHOL/HDL Ratio: 2.9 ratio
Triglycerides: 99 mg/dL (ref <150)
VLDL: 20 mg/dL (ref 0–40)

## 2024-03-22 LAB — CBC
HCT: 35.8 % — ABNORMAL LOW (ref 39.0–52.0)
Hemoglobin: 11.3 g/dL — ABNORMAL LOW (ref 13.0–17.0)
MCH: 27.4 pg (ref 26.0–34.0)
MCHC: 31.6 g/dL (ref 30.0–36.0)
MCV: 86.7 fL (ref 80.0–100.0)
Platelets: 227 10*3/uL (ref 150–400)
RBC: 4.13 MIL/uL — ABNORMAL LOW (ref 4.22–5.81)
RDW: 19.1 % — ABNORMAL HIGH (ref 11.5–15.5)
WBC: 7.3 10*3/uL (ref 4.0–10.5)
nRBC: 0 % (ref 0.0–0.2)

## 2024-03-22 LAB — TSH: TSH: 1.177 u[IU]/mL (ref 0.350–4.500)

## 2024-03-22 LAB — BASIC METABOLIC PANEL WITH GFR
Anion gap: 10 (ref 5–15)
BUN: 11 mg/dL (ref 8–23)
CO2: 24 mmol/L (ref 22–32)
Calcium: 9.3 mg/dL (ref 8.9–10.3)
Chloride: 104 mmol/L (ref 98–111)
Creatinine, Ser: 0.9 mg/dL (ref 0.61–1.24)
GFR, Estimated: >60 mL/min (ref >60)
Glucose, Bld: 149 mg/dL — ABNORMAL HIGH (ref 70–99)
Potassium: 3.1 mmol/L — ABNORMAL LOW (ref 3.5–5.1)
Sodium: 138 mmol/L (ref 135–145)

## 2024-03-22 LAB — PHOSPHORUS: Phosphorus: 2.8 mg/dL (ref 2.5–4.6)

## 2024-03-22 LAB — GLUCOSE, CAPILLARY: Glucose-Capillary: 140 mg/dL — ABNORMAL HIGH (ref 70–99)

## 2024-03-22 LAB — HEMOGLOBIN A1C
Hgb A1c MFr Bld: 6.4 % — ABNORMAL HIGH (ref 4.8–5.6)
Mean Plasma Glucose: 136.98 mg/dL

## 2024-03-22 LAB — MAGNESIUM: Magnesium: 1.5 mg/dL — ABNORMAL LOW (ref 1.7–2.4)

## 2024-03-22 MED ORDER — MIDODRINE HCL 5 MG PO TABS
5.0000 mg | ORAL_TABLET | Freq: Two times a day (BID) | ORAL | Status: DC
  Administered 2024-03-22 – 2024-03-31 (×12): 5 mg via ORAL
  Filled 2024-03-22 (×14): qty 1

## 2024-03-22 MED ORDER — QUETIAPINE FUMARATE 25 MG PO TABS
25.0000 mg | ORAL_TABLET | Freq: Every evening | ORAL | Status: DC
  Administered 2024-03-22 – 2024-03-23 (×3): 25 mg via ORAL
  Filled 2024-03-22 (×3): qty 1

## 2024-03-22 MED ORDER — ATORVASTATIN CALCIUM 10 MG PO TABS
20.0000 mg | ORAL_TABLET | Freq: Every day | ORAL | Status: DC
  Administered 2024-03-22 – 2024-03-30 (×10): 20 mg via ORAL
  Filled 2024-03-22 (×11): qty 2

## 2024-03-22 MED ORDER — MODAFINIL 100 MG PO TABS
200.0000 mg | ORAL_TABLET | Freq: Every day | ORAL | Status: DC
  Administered 2024-03-23 – 2024-03-31 (×9): 200 mg via ORAL
  Filled 2024-03-22 (×10): qty 2

## 2024-03-22 MED ORDER — RIVAROXABAN 20 MG PO TABS
20.0000 mg | ORAL_TABLET | Freq: Every day | ORAL | Status: DC
  Administered 2024-03-22 – 2024-03-31 (×10): 20 mg via ORAL
  Filled 2024-03-22 (×11): qty 1

## 2024-03-22 MED ORDER — TAMSULOSIN HCL 0.4 MG PO CAPS
0.4000 mg | ORAL_CAPSULE | Freq: Every day | ORAL | Status: DC
  Administered 2024-03-22 – 2024-03-30 (×9): 0.4 mg via ORAL
  Filled 2024-03-22 (×9): qty 1

## 2024-03-22 MED ORDER — CARBIDOPA-LEVODOPA ER 50-200 MG PO TBCR
1.0000 | EXTENDED_RELEASE_TABLET | Freq: Four times a day (QID) | ORAL | Status: DC
Start: 2024-03-22 — End: 2024-03-28
  Administered 2024-03-22 – 2024-03-28 (×24): 1 via ORAL
  Filled 2024-03-22 (×31): qty 1

## 2024-03-22 MED ORDER — MELATONIN 5 MG PO TABS
10.0000 mg | ORAL_TABLET | Freq: Every day | ORAL | Status: DC
  Administered 2024-03-22 – 2024-03-30 (×10): 10 mg via ORAL
  Filled 2024-03-22 (×10): qty 2

## 2024-03-22 MED ORDER — PERFLUTREN LIPID MICROSPHERE
1.0000 mL | INTRAVENOUS | Status: AC | PRN
  Administered 2024-03-22: 3 mL via INTRAVENOUS

## 2024-03-22 NOTE — Assessment & Plan Note (Signed)
-   Due to history of sinus node dysfunction -Radiology note copied below for future reference "Device system confirmed to be MRI conditional, with implant date > 6 weeks ago, and no evidence of abandoned or epicardial leads in review of most recent CXR  Device last cleared by EP Provider: [name] 03/21/24  Clearance is good through for 1 year as long as parameters remain stable at time of check. If pt undergoes a cardiac device procedure during that time, they should be re-cleared.   Tachy-therapies to be programmed off if applicable with device back to pre-MRI settings after completion of exam. [employer] was available remotely to assist in programming recommendations."

## 2024-03-22 NOTE — Hospital Course (Signed)
 Jordan [name] is an 83 year old male with PMH CVA, DM II, chronic diastolic CHF, arthritis, Parkinson's disease, CAD, DVT, gout, HTN, HLD, OSA on CPAP, metastatic neuroendocrine tumor who presented with right sided weakness and aphasia. He was recently hospitalized 4/26 - 5/2 for encephalopathy that was felt to be due to medication side effect of Seroquel .  CT head and EEG were also unremarkable at that time. His Seroquel  was adjusted to be given at 5 PM which helped with hallucinations/agitation without oversedation.  This hospitalization he presented with aphasia and right side weakness.  Symptoms were slowly improving upon admission.  He was admitted for repeat stroke workup.

## 2024-03-22 NOTE — Assessment & Plan Note (Signed)
-   On midodrine  at home with parameters to hold if SBP greater than 130

## 2024-03-22 NOTE — Assessment & Plan Note (Addendum)
-   mentation worse today; I do appreciate a component of underlying dementia in setting of his PD - Continue Sinemet  and modafinil  -Continue Seroquel  at 5 PM - With recurrent hospitalizations and slow progressive decline, may need to also start considering more contribution from underlying Parkinson's and that goals of care may need to continue to be discussed as time goes forward

## 2024-03-22 NOTE — Progress Notes (Signed)
  Device system confirmed to be MRI conditional, with implant date > 6 weeks ago, and no evidence of abandoned or epicardial leads in review of most recent CXR  Device last cleared by EP Provider: [name] 03/21/24  Clearance is good through for 1 year as long as parameters remain stable at time of check. If pt undergoes a cardiac device procedure during that time, they should be re-cleared.   Tachy-therapies to be programmed off if applicable with device back to pre-MRI settings after completion of exam.  [employer] was available remotely to assist in programming recommendations.   Arlys Lamer, RT  03/22/2024 8:08 AM

## 2024-03-22 NOTE — Progress Notes (Addendum)
 STROKE TEAM PROGRESS NOTE   INTERIM HISTORY/SUBJECTIVE No family at the bedside.  Patient sitting in bed eating breakfast in no apparent distress.  Patient states he feels better MRI brain with no acute process  EEG showed continuous generalized slowing no seizures identified Patient mental status and weakness appear to have returned back to baseline OBJECTIVE  CBC    Component Value Date/Time   WBC 7.3 03/22/2024 0500   RBC 4.13 (L) 03/22/2024 0500   HGB 11.3 (L) 03/22/2024 0500   HGB 10.1 (L) 02/07/2024 1518   HCT 35.8 (L) 03/22/2024 0500   HCT 32.7 (L) 02/07/2024 1518   PLT 227 03/22/2024 0500   PLT 305 02/07/2024 1518   MCV 86.7 03/22/2024 0500   MCH 27.4 03/22/2024 0500   MCHC 31.6 03/22/2024 0500   RDW 19.1 (H) 03/22/2024 0500   LYMPHSABS 2.0 03/21/2024 1731   MONOABS 0.8 03/21/2024 1731   EOSABS 0.3 03/21/2024 1731   BASOSABS 0.1 03/21/2024 1731    BMET    Component Value Date/Time   NA 138 03/22/2024 0500   NA 145 07/23/2021 0000   K 3.1 (L) 03/22/2024 0500   CL 104 03/22/2024 0500   CO2 24 03/22/2024 0500   GLUCOSE 149 (H) 03/22/2024 0500   BUN 11 03/22/2024 0500   BUN 14 07/23/2021 0000   CREATININE 0.90 03/22/2024 0500   CREATININE 1.02 12/13/2023 1221   CREATININE 1.16 03/18/2016 1011   CALCIUM  9.3 03/22/2024 0500   GFRNONAA >60 03/22/2024 0500   GFRNONAA >60 12/13/2023 1221    IMAGING past 24 hours EEG adult Result Date: 03/22/2024 Arleene Lack, MD     03/22/2024  8:00 AM Patient Name: Jordan [name] MRN: [id number] Epilepsy Attending: Arleene Lack Referring Physician/Provider: Imogene Mana, NP Date: 03/21/2024 Duration: 22.44 mins Patient history: 83yo M with left gaze preference and right-sided weakness. EEG to evaluate for seizure Level of alertness: Awake AEDs during EEG study: None Technical aspects: This EEG study was done with scalp electrodes positioned according to the 10-20 International system of electrode placement. Electrical  activity was reviewed with band pass filter of 1-70Hz , sensitivity of 7 uV/mm, display speed of 55mm/sec with a 60Hz  notched filter applied as appropriate. EEG data were recorded continuously and digitally stored.  Video monitoring was available and reviewed as appropriate. Description: The posterior dominant rhythm consists of 7 Hz activity of moderate voltage (25-35 uV) seen predominantly in posterior head regions, symmetric and reactive to eye opening and eye closing. EEG showed continuous generalized predominantly 5 to 6 Hz theta slowing admixed with intermittent 2-3hz  delta slowing.  Hyperventilation and photic stimulation were not performed.   ABNORMALITY - Continuous slow, generalized IMPRESSION: This study is suggestive of moderate diffuse encephalopathy. No seizures or epileptiform discharges were seen throughout the recording. Priyanka Suzanne Erps   CT ANGIO HEAD NECK W WO CM (CODE STROKE) Result Date: 03/21/2024 CLINICAL DATA:  Neuro deficit, concern for stroke, right-sided weakness, slurred speech. EXAM: CT ANGIOGRAPHY HEAD AND NECK WITH AND WITHOUT CONTRAST TECHNIQUE: Multidetector CT imaging of the head and neck was performed using the standard protocol during bolus administration of intravenous contrast. Multiplanar CT image reconstructions and MIPs were obtained to evaluate the vascular anatomy. Carotid stenosis measurements (when applicable) are obtained utilizing NASCET criteria, using the distal internal carotid diameter as the denominator. RADIATION DOSE REDUCTION: This exam was performed according to the departmental dose-optimization program which includes automated exposure control, adjustment of the mA and/or kV according to patient size  and/or use of iterative reconstruction technique. CONTRAST:  75mL OMNIPAQUE  IOHEXOL  350 MG/ML SOLN COMPARISON:  CT head earlier same day. FINDINGS: CTA NECK FINDINGS Aortic arch: Standard configuration of the aortic arch. Imaged portion shows no evidence of  aneurysm or dissection. Mild atherosclerosis along the aortic arch and visualized proximal descending thoracic aorta. No significant stenosis of the major arch vessel origins. Pulmonary arteries: As permitted by contrast timing, there are no filling defects in the visualized pulmonary arteries. Subclavian arteries: The subclavian arteries are patent bilaterally. Right carotid system: Atherosclerosis noted along the distal brachiocephalic artery without significant narrowing at the origin of the right common carotid artery. Right carotid artery is patent from the origin to the skull base. Calcified atherosclerosis at the carotid bifurcation resulting in approximately 30% stenosis. Left carotid system: Patent from the origin to the skull base. Calcified atherosclerosis at the carotid bifurcation without hemodynamically significant stenosis. Vertebral arteries: Codominant. No evidence of dissection, stenosis (50% or greater), or occlusion. Atherosclerosis of the bilateral V4 segments resulting in mild stenosis. Skeleton: No acute findings. Degenerative changes in the cervical spine. Disc space narrowing most pronounced at C4-5 and C5-6. Other neck: The visualized airway is patent. No cervical lymphadenopathy. Upper chest: Atelectasis in the dependent aspect of both lungs. Partially visualized left chest wall pacer device. Review of the MIP images confirms the above findings CTA HEAD FINDINGS ANTERIOR CIRCULATION: The intracranial ICAs are patent bilaterally. Atherosclerosis of the bilateral carotid siphons. Mild stenosis of the right supraclinoid ICA. No significant stenosis, proximal occlusion, aneurysm, or vascular malformation. MCAs: The middle cerebral arteries are patent bilaterally. ACAs: The anterior cerebral arteries are patent bilaterally. POSTERIOR CIRCULATION: No significant stenosis, proximal occlusion, aneurysm, or vascular malformation. PCAs: Patent bilaterally. Focal severe stenosis of the P2 segment  right PCA Pcomm: Not well visualized. SCAs: The superior cerebellar arteries are patent bilaterally. Basilar artery: Patent AICAs: Visualized on the left. PICAs: Visualized on the right. Vertebral arteries: The intracranial vertebral arteries are patent. Venous sinuses: As permitted by contrast timing, patent. Anatomic variants: None Review of the MIP images confirms the above findings IMPRESSION: No large vessel occlusion. Focal severe stenosis of the P2 segment right PCA. Otherwise no high-grade stenosis identified. Atherosclerosis at the carotid bifurcations. 30% stenosis at the origin of the right cervical ICA. Mild stenosis of the right supraclinoid ICA. Additional mild stenosis of the V4 segments of the bilateral vertebral arteries. Aortic Atherosclerosis (ICD10-I70.0). Electronically Signed   By: Denny Flack M.D.   On: 03/21/2024 18:54   CT HEAD CODE STROKE WO CONTRAST Result Date: 03/21/2024 EXAM: CT HEAD WITHOUT 03/21/2024 05:33:57 PM TECHNIQUE: CT of the head was performed without the administration of intravenous contrast. Automated exposure control, iterative reconstruction, and/or weight based adjustment of the mA/kV was utilized to reduce the radiation dose to as low as reasonably achievable. COMPARISON: CT head without contrast 02/25/2024. CLINICAL HISTORY: Neuro deficit, acute, stroke suspected. R/S weakness, slurred speech. FINDINGS: BRAIN AND VENTRICLES: Remote infarct is noted in the anterior left frontal lobe. Remote lacunar infarcts are present in the cerebellum bilaterally. No acute infarct, hemorrhage, or mass lesion is present. Moderate atrophy and white matter changes bilaterally are stable. The ventricles are proportionate to the degree of atrophy. ASPECTS: . Ganglionic level: 7/7 Supraganglionic level:3/3. Total: 10/10 . Aaron Aas ORBITS: Lens replacements are noted bilaterally. The globes and orbits are otherwise within normal limits. SINUSES: A posterior right ethmoid air cell is opacified.  Anterior left ethmoid air cells and the left frontal sinus are opacified.  Chronic wall thickening is noted in the left maxillary sinus. Minimal secretions are present in the left maxillary sinus. No fluid level is present. SOFT TISSUES AND SKULL: No acute abnormality of the visualized skull or soft tissues. VASCULATURE: Atherosclerotic calcifications are present within the cavernous internal carotid arteries bilaterally. No hyperdense vessel is present. IMPRESSION: 1. No acute intracranial abnormality. 2. Remote infarct in the anterior left frontal lobe and remote lacunar infarcts in the cerebellum bilaterally. 3. Stable moderate atrophy and white matter changes bilaterally. Key findings were texted via amion to Dr. Cleone Dad at 5:51pm Electronically signed by: Audree Leas MD 03/21/2024 05:52 PM EDT RP Workstation: [id number]    Vitals:   03/22/24 0600 03/22/24 0642 03/22/24 0700 03/22/24 0800  BP: (!) 164/99  (!) 178/89 (!) 149/77  Pulse: 69  69 68  Resp: (!) 21  (!) 23 14  Temp:  97.6 F (36.4 C)    TempSrc:  Axillary    SpO2: 96%  96% 94%  Weight:         PHYSICAL EXAM General: Ackley ill male Psych:  Mood and affect appropriate for situation CV: Regular rate and rhythm on monitor Respiratory:  Regular, unlabored respirations on room air GI: Abdomen soft and nontender   NEURO:  Mental Status: AA&Ox3, patient is able to give clear and coherent history Speech/Language: speech is without dysarthria or aphasia.  Naming, repetition, fluency, and comprehension intact.  Cranial Nerves:  II: PERRL. Visual fields full.  III, IV, VI: EOMI. Eyelids elevate symmetrically.  V: Sensation is intact to light touch and symmetrical to face.  VII: Face is symmetrical resting and smiling VIII: hearing intact to voice. IX, X: Palate elevates symmetrically. Phonation is normal.  UJ:[id number] shrug 5/5. XII: tongue is midline without fasciculations. Motor: 5/5 strength to all muscle groups  tested.  Tone: is normal and bulk is normal Sensation- Intact to light touch bilaterally. Extinction absent to light touch to DSS.   Coordination: FTN intact bilaterally, HKS: no ataxia in BLE.No drift.  Gait- deferred  Most Recent NIH 0   ASSESSMENT/PLAN  [name] is a 83 y.o. male with history of Parkinson's disease, s/p pacemaker CHF, CAD, history of DVT on Xarelto , gout, hyperlipidemia, hypertension, narcolepsy, OSA on CPAP, stroke, diabetes admitted for right-sided weakness and aphasia.  NIH on Admission 16  Left hemispheric TIA likely small vessel disease  Code Stroke  CT head No acute abnormality.   Remote infarct in the anterior left frontal lobe and remote lacunar infarcts in the cerebellum bilaterally Small vessel disease. Atrophy.  ASPECTS 10.     CTA head & neck no LVO. Focal severe stenosis of the P2 segment right PCA. Otherwise no high-grade stenosis identified. MRI no acute process 2D Echo ordered LDL 55 HgbA1c 6.8 VTE prophylaxis -SCDs Xarelto  (rivaroxaban ) daily prior to admission, now on Xarelto  (rivaroxaban ) daily Therapy recommendations:  Pending Disposition: Pending  Hx of Stroke/TIA Parkinson's disease Remote infarct in the anterior left frontal lobe and remote lacunar infarcts in the cerebellum bilaterally  On Sinemet , Provigil , Seroquel  at night  History of DVT Sinew Xarelto    CAD CHF S/p PPM Orthostatic hypotension Home meds: Midodrine  5 mg Stable Blood Pressure Goal: SBP less than 160   Hyperlipidemia Home meds: Atorvastatin  20 mg, resumed in hospital LDL 55, goal < 70 Continue statin at discharge  Diabetes type II Controlled Home meds: Metformin  HgbA1c 6.8, goal < 7.0 CBGs SSI Recommend close follow-up with PCP for better DM control   Dysphagia  Patient has post-stroke dysphagia, SLP consulted    Diet   Diet Heart Room service appropriate? Yes; Fluid consistency: Thin   Advance diet as tolerated  Other Stroke Risk  Factors Obesity, Body mass index is 30.8 kg/m., BMI >/= 30 associated with increased stroke risk, recommend weight loss, diet and exercise as appropriate  Coronary artery disease Congestive heart failure Obstructive sleep apnea, on CPAP at home    Other Active Problems Metastatic neuroendocrine tumor  Hospital day # 1  Jonette Nestle DNP, ACNPC-AG  [facility]  I have personally obtained history,examined this patient, reviewed notes, independently viewed imaging studies, participated in medical decision making and plan of care.ROS completed by me personally and pertinent positives fully documented  I have made any additions or clarifications directly to the above note. Agree with note above.  Patient with history of Parkinson disease presented with transient right-sided weakness possibly left hemispheric TIA from small vessel disease.  Seems to have returned to his baseline and MRI shows no acute stroke.  Continue ongoing stroke workup.  Continue Xarelto  for stroke prevention.  Aggressive risk factor modification.  No family at the bedside.  Discussed with Dr Gladstone Lamer.  Greater than 50% time during this 50-minute visit was spent on counseling and coordination of care about TIA and discussion about stroke prevention and treatment and answering questions.  Ardella Beaver, MD Medical Director [facility] Pager: [phone] 03/22/2024 1:35 PM  To contact Stroke Continuity provider, please refer to [email or web address]. After hours, contact General Neurology

## 2024-03-22 NOTE — Assessment & Plan Note (Signed)
-   per [facility]: "Remote infarct in the anterior left frontal lobe and remote lacunar infarcts in the cerebellum bilaterally." - continue Lipitor and Xarelto

## 2024-03-22 NOTE — Assessment & Plan Note (Signed)
-   No signs/symptoms of exacerbation - Follow-up repeat echo -Last echo 01/06/2022 showed EF 60 to 65%, indeterminate diastolic parameters

## 2024-03-22 NOTE — Assessment & Plan Note (Signed)
Continue Xarelto Rate controlled

## 2024-03-22 NOTE — Assessment & Plan Note (Addendum)
-   A1c 6.4% - Continue SSI and CBG monitoring

## 2024-03-22 NOTE — Assessment & Plan Note (Signed)
-   Follows with Dr. 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

## 2024-03-22 NOTE — Assessment & Plan Note (Signed)
 Continue Xarelto

## 2024-03-22 NOTE — Progress Notes (Signed)
  Echocardiogram 2D Echocardiogram has been performed.  Rane Blitch L Gaylon Bentz RDCS 03/22/2024, 2:55 PM

## 2024-03-22 NOTE — Assessment & Plan Note (Signed)
 -  Continue nightly CPAP

## 2024-03-22 NOTE — Assessment & Plan Note (Addendum)
-   possible TIA given aphasia and right sided weakness that was slowly improving on admission; does not seem to have any obvious deficits on exam  - MRI brain also negative for acute stroke; CT head on admission shows remote infarct involving anterior left frontal lobe and remote lacunar infarcts in the cerebellum bilaterally - no clot on echo  - Continue Lipitor - Resumed Xarelto  - PT/OT recommending SNF; [facility] and [facility] both declined; [name] currently no beds

## 2024-03-22 NOTE — Evaluation (Signed)
 Physical Therapy Evaluation Patient Details Name: Jordan [name] MRN: [id number] DOB: [birth year] Today's Date: 03/22/2024  History of Present Illness  Pt is 83 yo presenting to [facility] ED on 5/21 due to R sided weakness and aphasia; recent hospitalization.   PMHx: Parkinsons, HLD, OSA, DM, HTN, chronic diastolic HF, CVA, frequent falls, L2 compression fx, L hip fx  Clinical Impression  Pt is presenting slightly below prior level of functioning. Pt currently requires Min to Mod a for bed mobility, Mod a for sit to stand with crouched posture and posterior lean. Pt as able to take 3x side steps at EOB with Mod A for wgt shifting and difficulty bearing wgt on the RLE due to weakness. Due to pt current functional status, home set up and available assistance at home recommending skilled physical therapy services < 3 hours/day in order to address strength, balance and functional mobility to decrease risk for falls, injury, immobility, skin break down and re-hospitalization.          If plan is discharge home, recommend the following: Two people to help with walking and/or transfers;Assistance with cooking/housework;Assist for transportation;Supervision due to cognitive status   Can travel by private vehicle   No    Equipment Recommendations None recommended by PT     Functional Status Assessment Patient has had a recent decline in their functional status and demonstrates the ability to make significant improvements in function in a reasonable and predictable amount of time.     Precautions / Restrictions Precautions Precautions: Fall Recall of Precautions/Restrictions: Impaired Restrictions Weight Bearing Restrictions Per Provider Order: No      Mobility  Bed Mobility Overal bed mobility: Needs Assistance Bed Mobility: Supine to Sit, Sit to Supine     Supine to sit: Min assist Sit to supine: Mod assist   General bed mobility comments: Min assist for trunk support to rise and cues to  bring LEs off bed. Mod assist for trunk and LEs to sequence and raise back into bed.    Transfers Overall transfer level: Needs assistance Equipment used: Rolling walker (2 wheels) Transfers: Sit to/from Stand Sit to Stand: Mod assist           General transfer comment: Mod assist for initial momentum to stand from bed, verbal cues for safe hand placement. Pt standing in crouched position with slight posterior lean requiring Mod a throughout.    Ambulation/Gait     Pre-gait activities: Pt was able to take side steps at EOB with Mod A wgt shifting and significant difficulty WB through RLE due to weakness.      Balance Overall balance assessment: History of Falls, Needs assistance Sitting-balance support: Bilateral upper extremity supported, Feet unsupported, Feet supported Sitting balance-Leahy Scale: Fair Sitting balance - Comments: sitting edge of stretcher without assist Postural control: Right lateral lean Standing balance support: Bilateral upper extremity supported, During functional activity Standing balance-Leahy Scale: Poor Standing balance comment: reliant on therapist for support       Pertinent Vitals/Pain Pain Assessment Pain Assessment: No/denies pain Pain Intervention(s): Monitored during session    Home Living Family/patient expects to be discharged to:: Private residence Living Arrangements: Spouse/significant other;Children Available Help at Discharge: Family;Available 24 hours/day Type of Home: Other(Comment) (condo) Home Access: Level entry;Elevator       Home Layout: One level Home Equipment: Agricultural consultant (2 wheels);Wheelchair - Manufacturing systems engineer;Other (comment);Grab bars - tub/shower;Grab bars - toilet;Lift chair Additional Comments: info from last admission, pt unable to give any history or PLOF  Prior Function Prior Level of Function : Needs assist       Physical Assist : ADLs (physical);Mobility (physical) Mobility (physical):  Transfers;Gait ADLs (physical): Bathing;Dressing;IADLs;Toileting Mobility Comments: RW for home, w/c for community. Has had falls ADLs Comments: assisted with LB bathing and dressing. wife manages meds     Extremity/Trunk Assessment   Upper Extremity Assessment Upper Extremity Assessment: Defer to OT evaluation;Generalized weakness    Lower Extremity Assessment Lower Extremity Assessment: Generalized weakness    Cervical / Trunk Assessment Cervical / Trunk Assessment: Kyphotic  Communication   Communication Communication: Impaired Factors Affecting Communication: Hearing impaired    Cognition Arousal: Alert Behavior During Therapy: WFL for tasks assessed/performed   PT - Cognitive impairments: Memory, Attention, Sequencing, Problem solving, Safety/Judgement, Awareness, Initiation   Orientation impairments: Place, Situation     PT - Cognition Comments: Pt states he is at [facility], think it is in [place]. Unsure why he is in the hospital Following commands: Impaired Following commands impaired: Follows one step commands with increased time, Follows multi-step commands inconsistently     Cueing Cueing Techniques: Verbal cues, Gestural cues, Tactile cues, Visual cues     General Comments General comments (skin integrity, edema, etc.): HR and O2 sats remained WNL Throughout session.        Assessment/Plan    PT Assessment Patient needs continued PT services  PT Problem List Decreased strength;Decreased activity tolerance;Decreased balance;Decreased mobility;Decreased coordination;Decreased cognition;Decreased knowledge of use of DME;Decreased safety awareness;Decreased knowledge of precautions       PT Treatment Interventions DME instruction;Gait training;Therapeutic activities;Functional mobility training;Therapeutic exercise;Balance training;Neuromuscular re-education;Cognitive remediation;Patient/family education    PT Goals (Current goals can be found in the  Care Plan section)  Acute Rehab PT Goals Patient Stated Goal: none stated PT Goal Formulation: Patient unable to participate in goal setting Time For Goal Achievement: 04/05/24 Potential to Achieve Goals: Fair    Frequency Min 2X/week        AM-PAC PT "6 Clicks" Mobility  Outcome Measure Help needed turning from your back to your side while in a flat bed without using bedrails?: A Little Help needed moving from lying on your back to sitting on the side of a flat bed without using bedrails?: A Little Help needed moving to and from a bed to a chair (including a wheelchair)?: A Lot Help needed standing up from a chair using your arms (e.g., wheelchair or bedside chair)?: A Lot Help needed to walk in hospital room?: Total Help needed climbing 3-5 steps with a railing? : Total 6 Click Score: 12    End of Session Equipment Utilized During Treatment: Gait belt Activity Tolerance: Patient tolerated treatment well Patient left: in bed;with call bell/phone within reach Nurse Communication: Mobility status PT Visit Diagnosis: History of falling (Z91.81);Muscle weakness (generalized) (M62.81);Difficulty in walking, not elsewhere classified (R26.2);Unsteadiness on feet (R26.81);Dizziness and giddiness (R42)    Time: 1610-9604 PT Time Calculation (min) (ACUTE ONLY): 15 min   Charges:   PT Evaluation $PT Eval Low Complexity: 1 Low   PT General Charges $$ ACUTE PT VISIT: 1 Visit         Sloan Duncans, DPT, CLT  Acute Rehabilitation Services Office: [phone] (Secure chat preferred)   Jenice Mitts 03/22/2024, 2:34 PM

## 2024-03-22 NOTE — ED Notes (Signed)
 Assumed care of pt.  NAD noted. RR even and unlabored.

## 2024-03-22 NOTE — Progress Notes (Signed)
 Progress Note    Jordan [name]   ZOX:[id number]  DOB: 1941/05/15  DOA: 03/21/2024     1 PCP: Roselind Congo, MD  Initial CC: right side weakness, aphasia  Hospital Course: Jordan [name] is an 82 year old male with PMH CVA, DM II, chronic diastolic CHF, arthritis, Parkinson's disease, CAD, DVT, gout, HTN, HLD, OSA on CPAP, metastatic neuroendocrine tumor who presented with right sided weakness and aphasia. He was recently hospitalized 4/26 - 5/2 for encephalopathy that was felt to be due to medication side effect of Seroquel .  CT head and EEG were also unremarkable at that time. His Seroquel  was adjusted to be given at 5 PM which helped with hallucinations/agitation without oversedation.  This hospitalization he presented with aphasia and right side weakness.  Symptoms were slowly improving upon admission.  He was admitted for repeat stroke workup.  Interval History:  Patient resting comfortably in bed this morning.  Appears extremely weak and deconditioned; unable to slide himself up in the bed.  No obvious neurodeficits.  Assessment and Plan: TIA (transient ischemic attack) - possible TIA given aphasia and right sided weakness that was slowly improving on admission; does not seem to have any obvious deficits on exam today - MRI brain also negative for acute stroke; CT head on admission shows remote infarct involving anterior left frontal lobe and remote lacunar infarcts in the cerebellum bilaterally -Follow-up echo - Continue Lipitor - Resume Xarelto  - Follow-up PT/OT evals  Physical deconditioning - Follow-up PT/OT evals  Parkinson's disease (HCC) - Continue Sinemet  and modafinil  -Continue Seroquel  at 5 PM  History of DVT (deep vein thrombosis) - Continue Xarelto   History of CVA (cerebrovascular accident) - per [facility]: "Remote infarct in the anterior left frontal lobe and remote lacunar infarcts in the cerebellum bilaterally." - continue Lipitor and Xarelto   Essential  hypertension - On midodrine  at home with parameters to hold if SBP greater than 130  Paroxysmal atrial fibrillation (HCC) - Continue Xarelto  -Rate controlled  Chronic diastolic heart failure (HCC) - No signs/symptoms of exacerbation - Follow-up repeat echo -Last echo 01/06/2022 showed EF 60 to 65%, indeterminate diastolic parameters  Type 2 diabetes mellitus with hyperlipidemia (HCC) - Follow-up A1c - Continue SSI and CBG monitoring  HLD (hyperlipidemia) - LDL 55 - Continue Lipitor  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 If pt undergoes a cardiac device procedure during that time, they should be re-cleared.   Tachy-therapies to be programmed off if applicable with device back to pre-MRI settings after completion of exam. 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 Level 2 (Chairfast) - Balance while sitting on edge of bed and cannot stand                Barriers to discharge: none Disposition Plan:  TBD HH orders placed: TBD Status is: Inpt  Objective: Blood pressure (!) 141/92, pulse 66, temperature 97.6 F (36.4 C), temperature source Oral, resp. rate 18, weight 94.6 kg, SpO2 100%.  Examination:  Physical Exam Constitutional:      General: He is not in acute distress.    Appearance: Normal appearance. He is obese.     Comments: Grossly deconditioned appearing  HENT:     Head: Normocephalic and atraumatic.     Mouth/Throat:     Mouth: Mucous membranes are moist.  Eyes:     Extraocular Movements: Extraocular movements intact.  Cardiovascular:     Rate and Rhythm: Normal rate and regular rhythm.  Pulmonary:     Effort: Pulmonary effort is normal. No respiratory distress.     Breath sounds: Normal breath sounds. No wheezing.  Abdominal:     General: Bowel sounds are normal. There is no distension.     Palpations: Abdomen is soft.     Tenderness: There is no abdominal tenderness.  Musculoskeletal:        General: Normal range of motion.     Cervical back: Normal range of motion and neck supple.  Skin:    General: Skin is warm and dry.  Neurological:     Mental Status: He is alert.     Comments: No focal deficits.  Tremor appreciated in hands  Psychiatric:        Mood and Affect: Mood normal.      Consultants:  Neurology  Procedures:    Data Reviewed: Results for orders placed or performed during the hospital encounter of 03/21/24 (from the past 24 hours)  Protime-INR     Status: Abnormal   Collection Time: 03/21/24  5:31 PM  Result Value Ref Range   Prothrombin Time 22.8 (H) 11.4 - 15.2 seconds   INR 2.0 (H) 0.8 - 1.2  APTT     Status: None   Collection Time: 03/21/24  5:31 PM  Result Value Ref Range   aPTT 36 24 - 36 seconds  CBC     Status: Abnormal    Collection Time: 03/21/24  5:31 PM  Result Value Ref Range   WBC 7.9 4.0 - 10.5 K/uL   RBC 4.40 4.22 - 5.81 MIL/uL   Hemoglobin 12.3 (L) 13.0 - 17.0 g/dL   HCT 29.5 (L) 62.1 - 30.8 %   MCV 87.7 80.0 - 100.0 fL   MCH 28.0 26.0 - 34.0 pg   MCHC 31.9 30.0 - 36.0 g/dL   RDW 65.7 (H) 84.6 - 96.2 %   Platelets 219 150 - 400 K/uL   nRBC 0.0 0.0 - 0.2 %  Differential     Status: None   Collection Time: 03/21/24  5:31 PM  Result Value Ref Range   Neutrophils Relative % 59 %   Neutro Abs 4.7 1.7 - 7.7 K/uL   Lymphocytes Relative 26 %   Lymphs Abs 2.0 0.7 - 4.0 K/uL   Monocytes Relative 10 %  Monocytes Absolute 0.8 0.1 - 1.0 K/uL   Eosinophils Relative 4 %   Eosinophils Absolute 0.3 0.0 - 0.5 K/uL   Basophils Relative 1 %   Basophils Absolute 0.1 0.0 - 0.1 K/uL   Immature Granulocytes 0 %   Abs Immature Granulocytes 0.03 0.00 - 0.07 K/uL  Comprehensive metabolic panel     Status: Abnormal   Collection Time: 03/21/24  5:31 PM  Result Value Ref Range   Sodium 139 135 - 145 mmol/L   Potassium 3.3 (L) 3.5 - 5.1 mmol/L   Chloride 107 98 - 111 mmol/L   CO2 22 22 - 32 mmol/L   Glucose, Bld 194 (H) 70 - 99 mg/dL   BUN 13 8 - 23 mg/dL   Creatinine, Ser 2.13 0.61 - 1.24 mg/dL   Calcium  9.3 8.9 - 10.3 mg/dL   Total Protein 6.7 6.5 - 8.1 g/dL   Albumin 3.2 (L) 3.5 - 5.0 g/dL   AST 26 15 - 41 U/L   ALT 6 0 - 44 U/L   Alkaline Phosphatase 62 38 - 126 U/L   Total Bilirubin 0.5 0.0 - 1.2 mg/dL   GFR, Estimated >08 >65 mL/min   Anion gap 10 5 - 15  CBG monitoring, ED     Status: Abnormal   Collection Time: 03/21/24  5:32 PM  Result Value Ref Range   Glucose-Capillary 180 (H) 70 - 99 mg/dL  Ethanol     Status: None   Collection Time: 03/21/24  5:35 PM  Result Value Ref Range   Alcohol , Ethyl (B) <15 <15 mg/dL  I-stat chem 8, ED     Status: Abnormal   Collection Time: 03/21/24  5:37 PM  Result Value Ref Range   Sodium 141 135 - 145 mmol/L   Potassium 3.4 (L) 3.5 - 5.1 mmol/L   Chloride  105 98 - 111 mmol/L   BUN 14 8 - 23 mg/dL   Creatinine, Ser 7.84 0.61 - 1.24 mg/dL   Glucose, Bld 696 (H) 70 - 99 mg/dL   Calcium , Ion 1.22 1.15 - 1.40 mmol/L   TCO2 24 22 - 32 mmol/L   Hemoglobin 12.6 (L) 13.0 - 17.0 g/dL   HCT 29.5 (L) 28.4 - 13.2 %  Urine rapid drug screen (hosp performed)     Status: None   Collection Time: 03/21/24  9:00 PM  Result Value Ref Range   Opiates NONE DETECTED NONE DETECTED   Cocaine NONE DETECTED NONE DETECTED   Benzodiazepines NONE DETECTED NONE DETECTED   Amphetamines NONE DETECTED NONE DETECTED   Tetrahydrocannabinol NONE DETECTED NONE DETECTED   Barbiturates NONE DETECTED NONE DETECTED  Urinalysis, Routine w reflex microscopic -Urine, Unspecified Source     Status: Abnormal   Collection Time: 03/21/24  9:00 PM  Result Value Ref Range   Color, Urine AMBER (A) YELLOW   APPearance CLOUDY (A) CLEAR   Specific Gravity, Urine >1.046 (H) 1.005 - 1.030   pH 5.0 5.0 - 8.0   Glucose, UA NEGATIVE NEGATIVE mg/dL   Hgb urine dipstick LARGE (A) NEGATIVE   Bilirubin Urine NEGATIVE NEGATIVE   Ketones, ur 5 (A) NEGATIVE mg/dL   Protein, ur 440 (A) NEGATIVE mg/dL   Nitrite NEGATIVE NEGATIVE   Leukocytes,Ua NEGATIVE NEGATIVE   RBC / HPF >50 0 - 5 RBC/hpf   WBC, UA 6-10 0 - 5 WBC/hpf   Bacteria, UA RARE (A) NONE SEEN   Squamous Epithelial / HPF 0-5 0 - 5 /HPF   Mucus PRESENT  CBG monitoring, ED     Status: Abnormal   Collection Time: 03/21/24 10:09 PM  Result Value Ref Range   Glucose-Capillary 150 (H) 70 - 99 mg/dL  Basic metabolic panel with GFR     Status: Abnormal   Collection Time: 03/22/24  5:00 AM  Result Value Ref Range   Sodium 138 135 - 145 mmol/L   Potassium 3.1 (L) 3.5 - 5.1 mmol/L   Chloride 104 98 - 111 mmol/L   CO2 24 22 - 32 mmol/L   Glucose, Bld 149 (H) 70 - 99 mg/dL   BUN 11 8 - 23 mg/dL   Creatinine, Ser 1.19 0.61 - 1.24 mg/dL   Calcium  9.3 8.9 - 10.3 mg/dL   GFR, Estimated >14 >78 mL/min   Anion gap 10 5 - 15  CBC     Status:  Abnormal   Collection Time: 03/22/24  5:00 AM  Result Value Ref Range   WBC 7.3 4.0 - 10.5 K/uL   RBC 4.13 (L) 4.22 - 5.81 MIL/uL   Hemoglobin 11.3 (L) 13.0 - 17.0 g/dL   HCT 29.5 (L) 62.1 - 30.8 %   MCV 86.7 80.0 - 100.0 fL   MCH 27.4 26.0 - 34.0 pg   MCHC 31.6 30.0 - 36.0 g/dL   RDW 65.7 (H) 84.6 - 96.2 %   Platelets 227 150 - 400 K/uL   nRBC 0.0 0.0 - 0.2 %  Magnesium      Status: Abnormal   Collection Time: 03/22/24  5:00 AM  Result Value Ref Range   Magnesium  1.5 (L) 1.7 - 2.4 mg/dL  Phosphorus     Status: None   Collection Time: 03/22/24  5:00 AM  Result Value Ref Range   Phosphorus 2.8 2.5 - 4.6 mg/dL  TSH     Status: None   Collection Time: 03/22/24  5:00 AM  Result Value Ref Range   TSH 1.177 0.350 - 4.500 uIU/mL  Lipid panel     Status: Abnormal   Collection Time: 03/22/24  5:00 AM  Result Value Ref Range   Cholesterol 114 0 - 200 mg/dL   Triglycerides 99 <952 mg/dL   HDL 39 (L) >84 mg/dL   Total CHOL/HDL Ratio 2.9 RATIO   VLDL 20 0 - 40 mg/dL   LDL Cholesterol 55 0 - 99 mg/dL  CBG monitoring, ED     Status: Abnormal   Collection Time: 03/22/24  7:54 AM  Result Value Ref Range   Glucose-Capillary 139 (H) 70 - 99 mg/dL  CBG monitoring, ED     Status: Abnormal   Collection Time: 03/22/24 12:56 PM  Result Value Ref Range   Glucose-Capillary 170 (H) 70 - 99 mg/dL    I have reviewed pertinent nursing notes, vitals, labs, and images as necessary. I have ordered labwork to follow up on as indicated.  I have reviewed the last notes from staff over past 24 hours. I have discussed patient's care plan and test results with nursing staff, CM/SW, and other staff as appropriate.  Time spent: Greater than 50% of the 55 minute visit was spent in counseling/coordination of care for the patient as laid out in the A&P.   LOS: 1 day   Faith Homes, MD [facility] 03/22/2024, 2:34 PM

## 2024-03-22 NOTE — Assessment & Plan Note (Addendum)
PT/OT recommending SNF.

## 2024-03-22 NOTE — Assessment & Plan Note (Signed)
-   LDL 55 - Continue Lipitor

## 2024-03-22 NOTE — Procedures (Signed)
 Patient Name: Jordan [name]  MRN: [id number]  Epilepsy Attending: Arleene Lack  Referring Physician/Provider: Imogene Mana, NP  Date: 03/21/2024 Duration: 22.44 mins  Patient history: 83yo M with left gaze preference and right-sided weakness. EEG to evaluate for seizure  Level of alertness: Awake  AEDs during EEG study: None  Technical aspects: This EEG study was done with scalp electrodes positioned according to the 10-20 International system of electrode placement. Electrical activity was reviewed with band pass filter of 1-70Hz , sensitivity of 7 uV/mm, display speed of 70mm/sec with a 60Hz  notched filter applied as appropriate. EEG data were recorded continuously and digitally stored.  Video monitoring was available and reviewed as appropriate.  Description: The posterior dominant rhythm consists of 7 Hz activity of moderate voltage (25-35 uV) seen predominantly in posterior head regions, symmetric and reactive to eye opening and eye closing. EEG showed continuous generalized predominantly 5 to 6 Hz theta slowing admixed with intermittent 2-3hz  delta slowing.  Hyperventilation and photic stimulation were not performed.     ABNORMALITY - Continuous slow, generalized  IMPRESSION: This study is suggestive of moderate diffuse encephalopathy. No seizures or epileptiform discharges were seen throughout the recording.  Jordan [name]

## 2024-03-22 NOTE — Plan of Care (Signed)
  Problem: Education: Goal: Knowledge of disease or condition will improve Outcome: Progressing Goal: Knowledge of secondary prevention will improve (MUST DOCUMENT ALL) Outcome: Progressing Goal: Knowledge of patient specific risk factors will improve (DELETE if not current risk factor) Outcome: Progressing   Problem: Ischemic Stroke/TIA Tissue Perfusion: Goal: Complications of ischemic stroke/TIA will be minimized Outcome: Progressing   Problem: Coping: Goal: Will verbalize positive feelings about self Outcome: Progressing Goal: Will identify appropriate support needs Outcome: Progressing   Problem: Health Behavior/Discharge Planning: Goal: Ability to manage health-related needs will improve Outcome: Progressing Goal: Goals will be collaboratively established with patient/family Outcome: Progressing   Problem: Self-Care: Goal: Ability to participate in self-care as condition permits will improve Outcome: Progressing Goal: Verbalization of feelings and concerns over difficulty with self-care will improve Outcome: Progressing Goal: Ability to communicate needs accurately will improve Outcome: Progressing   Problem: Nutrition: Goal: Risk of aspiration will decrease Outcome: Progressing Goal: Dietary intake will improve Outcome: Progressing   Problem: Education: Goal: Ability to describe self-care measures that may prevent or decrease complications (Diabetes Survival Skills Education) will improve Outcome: Progressing Goal: Individualized Educational Video(s) Outcome: Progressing   Problem: Coping: Goal: Ability to adjust to condition or change in health will improve Outcome: Progressing   Problem: Fluid Volume: Goal: Ability to maintain a balanced intake and output will improve Outcome: Progressing   Problem: Health Behavior/Discharge Planning: Goal: Ability to identify and utilize available resources and services will improve Outcome: Progressing Goal: Ability to  manage health-related needs will improve Outcome: Progressing   Problem: Metabolic: Goal: Ability to maintain appropriate glucose levels will improve Outcome: Progressing   Problem: Nutritional: Goal: Maintenance of adequate nutrition will improve Outcome: Progressing Goal: Progress toward achieving an optimal weight will improve Outcome: Progressing   Problem: Skin Integrity: Goal: Risk for impaired skin integrity will decrease Outcome: Progressing

## 2024-03-23 DIAGNOSIS — I2510 Atherosclerotic heart disease of native coronary artery without angina pectoris: Secondary | ICD-10-CM | POA: Diagnosis not present

## 2024-03-23 DIAGNOSIS — E1151 Type 2 diabetes mellitus with diabetic peripheral angiopathy without gangrene: Secondary | ICD-10-CM | POA: Diagnosis not present

## 2024-03-23 DIAGNOSIS — G459 Transient cerebral ischemic attack, unspecified: Secondary | ICD-10-CM | POA: Diagnosis not present

## 2024-03-23 DIAGNOSIS — G20A1 Parkinson's disease without dyskinesia, without mention of fluctuations: Secondary | ICD-10-CM | POA: Diagnosis not present

## 2024-03-23 DIAGNOSIS — I69391 Dysphagia following cerebral infarction: Secondary | ICD-10-CM | POA: Diagnosis not present

## 2024-03-23 DIAGNOSIS — R5381 Other malaise: Secondary | ICD-10-CM | POA: Diagnosis not present

## 2024-03-23 LAB — CBC WITH DIFFERENTIAL/PLATELET
Abs Immature Granulocytes: 0.02 10*3/uL (ref 0.00–0.07)
Basophils Absolute: 0.1 10*3/uL (ref 0.0–0.1)
Basophils Relative: 1 %
Eosinophils Absolute: 0.4 10*3/uL (ref 0.0–0.5)
Eosinophils Relative: 5 %
HCT: 38.2 % — ABNORMAL LOW (ref 39.0–52.0)
Hemoglobin: 12.5 g/dL — ABNORMAL LOW (ref 13.0–17.0)
Immature Granulocytes: 0 %
Lymphocytes Relative: 27 %
Lymphs Abs: 2.2 10*3/uL (ref 0.7–4.0)
MCH: 27.8 pg (ref 26.0–34.0)
MCHC: 32.7 g/dL (ref 30.0–36.0)
MCV: 85.1 fL (ref 80.0–100.0)
Monocytes Absolute: 0.8 10*3/uL (ref 0.1–1.0)
Monocytes Relative: 10 %
Neutro Abs: 4.6 10*3/uL (ref 1.7–7.7)
Neutrophils Relative %: 57 %
Platelets: 225 10*3/uL (ref 150–400)
RBC: 4.49 MIL/uL (ref 4.22–5.81)
RDW: 18.7 % — ABNORMAL HIGH (ref 11.5–15.5)
WBC: 7.9 10*3/uL (ref 4.0–10.5)
nRBC: 0 % (ref 0.0–0.2)

## 2024-03-23 LAB — GLUCOSE, CAPILLARY
Glucose-Capillary: 122 mg/dL — ABNORMAL HIGH (ref 70–99)
Glucose-Capillary: 170 mg/dL — ABNORMAL HIGH (ref 70–99)
Glucose-Capillary: 155 mg/dL — ABNORMAL HIGH (ref 70–99)
Glucose-Capillary: 250 mg/dL — ABNORMAL HIGH (ref 70–99)

## 2024-03-23 LAB — BASIC METABOLIC PANEL WITH GFR
Anion gap: 9 (ref 5–15)
BUN: 7 mg/dL — ABNORMAL LOW (ref 8–23)
CO2: 26 mmol/L (ref 22–32)
Calcium: 9.2 mg/dL (ref 8.9–10.3)
Chloride: 105 mmol/L (ref 98–111)
Creatinine, Ser: 0.77 mg/dL (ref 0.61–1.24)
GFR, Estimated: >60 mL/min (ref >60)
Glucose, Bld: 154 mg/dL — ABNORMAL HIGH (ref 70–99)
Potassium: 3.5 mmol/L (ref 3.5–5.1)
Sodium: 140 mmol/L (ref 135–145)

## 2024-03-23 LAB — MAGNESIUM: Magnesium: 1.6 mg/dL — ABNORMAL LOW (ref 1.7–2.4)

## 2024-03-23 NOTE — TOC CAGE-AID Note (Signed)
 Transition of Care [facility]) - CAGE-AID Screening   Patient Details  Name: Jordan [name] MRN: [id number] Date of Birth: [birth year]  Transition of [facility]) CM/SW Contact:    Bama Hanselman E Judas Mohammad, LCSW Phone Number: 03/23/2024, 12:56 PM   Clinical Narrative:    CAGE-AID Screening: Substance Abuse Screening unable to be completed due to: : Patient unable to participate (disoriented to situation)

## 2024-03-23 NOTE — Evaluation (Signed)
 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   History of DVT (deep vein thrombosis) 04/29/2022   Closed intertrochanteric fracture of left hip, initial encounter [facility])    Closed left hip fracture (HCC) secondary to fall at home 02/08/2022   Paroxysmal atrial fibrillation (HCC) 02/08/2022   Debility 01/08/2022   Constipation 01/07/2022   Physical deconditioning 01/07/2022   Acute metabolic encephalopathy 01/04/2022   Hypomagnesemia 01/04/2022   Pancreatitis 12/30/2021   Parkinson's disease (HCC) 12/21/2021   Parkinson disease (HCC) 12/18/2021   Frequent falls 12/18/2021   History of CVA (cerebrovascular accident) 12/18/2021   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   Hallucinations, visual 07/30/2021   Acetabulum fracture, left (HCC) 06/25/2021   Acute hip pain, left 06/24/2021   Fall at home, initial encounter 06/24/2021   Leukocytosis 06/24/2021   Chronic diastolic heart failure (HCC)    Sinusitis, acute maxillary 06/24/2020   AKI (acute kidney injury) (HCC)    Labile blood glucose    Cough    Benign essential HTN    Skin lesion of hand    Hypoalbuminemia due to protein-calorie malnutrition (HCC)    Controlled type 2 diabetes mellitus with hyperglycemia, without long-term current use of insulin  (HCC)    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   Morbid obesity (HCC)    Acute blood loss anemia    Acute thalamic infarction (HCC) 04/15/2020   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   TIA (transient ischemic attack) 02/17/2018   Type 2 diabetes mellitus  with hyperlipidemia (HCC) 02/17/2018   Insomnia 02/01/2018   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   Chronotropic incompetence 07/09/2014   Dyspnea on exertion 03/03/2014   HLD (hyperlipidemia) 12/13/2007   Essential hypertension 12/13/2007   Class 1 obesity 12/05/2007   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 Pauletta Boroughs last earl;y- 2014, cardiac cath. last done ?2009, blocks seen didn't require any intervention at that point.   Narcolepsy    [name] 04-11-97; Mean Latency 1.22min, SOREM 2   OSA on CPAP    NPSG 12-13-98 AHI 22.7   PONV (postoperative nausea and vomiting)    Presence of permanent cardiac pacemaker    Shortness of breath    with exertion    Stroke (HCC)    Type II diabetes mellitus (HCC)    Past Surgical History:  Past Surgical History:  Procedure Laterality Date   CARDIAC CATHETERIZATION  2009?   EP  IMPLANTABLE DEVICE N/A 03/25/2016   Procedure: Pacemaker Implant - Dual Chamber;  Surgeon: Tammie Fall, MD;  Location: [facility] INVASIVE CV LAB;  Service: Cardiovascular;  Laterality: N/A;   FRACTURE SURGERY     HIP FRACTURE SURGERY     INSERT / REPLACE / REMOVE PACEMAKER  03/24/2016   INTRAMEDULLARY (IM) NAIL INTERTROCHANTERIC Left 02/08/2022   Procedure: INTRAMEDULLARY (IM) NAIL INTERTROCHANTRIC;  Surgeon: Jasmine Mesi, MD;  Location: [facility] OR;  Service: Orthopedics;  Laterality: Left;   IR KYPHO LUMBAR  INC FX REDUCE BONE BX UNI/BIL CANNULATION INC/IMAGING  12/29/2021   IR RADIOLOGIST EVAL & MGMT  12/15/2021   IR RADIOLOGY PERIPHERAL GUIDED IV START  07/14/2022   IR US  GUIDE VASC ACCESS RIGHT  07/15/2022   LEFT AND RIGHT HEART CATHETERIZATION WITH CORONARY ANGIOGRAM N/A 06/05/2014   Procedure: LEFT AND RIGHT HEART CATHETERIZATION WITH CORONARY ANGIOGRAM;  Surgeon: Mickiel Albany, MD;  Location: [facility] CATH LAB;  Service: Cardiovascular;  Laterality: N/A;   NASAL FRACTURE SURGERY     SHOULDER ARTHROSCOPY WITH ROTATOR CUFF REPAIR AND SUBACROMIAL DECOMPRESSION Right 08/16/2013   Procedure: SHOULDER ARTHROSCOPY WITH ROTATOR CUFF REPAIR AND SUBACROMIAL DECOMPRESSION;  Surgeon: Derald Flattery, MD;  Location: [facility] OR;  Service: Orthopedics;  Laterality: Right;  Right shoulder arthroscopy rotator cuff repair, subacromial decompression.   TONSILLECTOMY     HPI:  Jordan [name] is an 83 yo male presenting to [facility] ED on 5/21 due to R sided weakness and aphasia. MRI brain negative for acute stroke; recent hospitalization.   PMHx: Parkinsons, HLD, OSA, DM, HTN, chronic diastolic HF, CVA, frequent falls, L2 compression fx, L hip fx, metastatic neuroendocrine tumor   Assessment / Plan / Recommendation Clinical Impression  Pt presents with cognitive-linguistic deficits characterized by difficulty with focused attention as well as expressive and receptive language. Spontaneous speech was typically observed at the phrase level and was frequently perseverative. He followed one step commands given increased wait time. He was ~50% accurate with yes/no questions with decreasing accuracy when questions pertained to his immediate environment or became more complex. He could repeat at the single-syllable word level. Pt was unable to describe a picture or participate in a confrontation naming task. Recommend ongoing SLP f/u to target deficits listed above. Will continue following.    SLP Assessment  SLP  Recommendation/Assessment: Patient needs continued Speech Lanaguage Pathology Services SLP Visit Diagnosis: Aphasia (R47.01)    Recommendations for follow up therapy are one component of a multi-disciplinary discharge planning process, led by the attending physician.  Recommendations may be updated based on patient status, additional functional criteria and insurance authorization.    Follow Up Recommendations  Skilled nursing-short term rehab (<3 hours/day)    Assistance Recommended at Discharge  Frequent or constant Supervision/Assistance  Functional Status Assessment Patient has had a recent decline in their functional status and demonstrates the ability to make significant improvements in function in a reasonable and predictable amount of time.  Frequency and Duration min 2x/week  2 weeks      SLP Evaluation Cognition  Overall Cognitive Status: Impaired/Different from baseline Arousal/Alertness: Awake/alert Orientation Level: Oriented to person;Oriented to time;Oriented to place;Disoriented to situation Attention: Focused Focused Attention: Impaired Focused Attention Impairment: Verbal basic;Functional basic       Comprehension  Auditory Comprehension Overall Auditory Comprehension: Impaired Yes/No Questions: Impaired Basic Biographical Questions: 76-100% accurate Basic Immediate Environment Questions: 25-49% accurate Complex Questions: 25-49% accurate Commands: Impaired One Step Basic Commands: 75-100% accurate    Expression  Expression Primary Mode of Expression: Verbal Verbal Expression Overall Verbal Expression: Impaired Initiation: Impaired Automatic Speech: Name Level of Generative/Spontaneous Verbalization: Phrase Repetition: Impaired Level of Impairment: Word level Naming: Impairment Confrontation: Impaired Pragmatics: Impairment Impairments: Abnormal affect;Eye contact Interfering Components: Attention Effective Techniques: Sentence completion   Oral /  Motor  Oral Motor/Sensory Function Overall Oral Motor/Sensory Function: Within functional limits Motor Speech Overall Motor Speech: Appears within functional limits for tasks assessed            Amil Kale, M.A., CCC-SLP Speech Language Pathology, Acute Rehabilitation Services  Secure Chat preferred [phone]  03/23/2024, 3:56 PM

## 2024-03-23 NOTE — NC FL2 (Signed)
 [place]  MEDICAID FL2 LEVEL OF CARE FORM     IDENTIFICATION  Patient Name: Jordan [name] Birthdate: 12/01/1940 Sex: male Admission Date (Current Location): 03/21/2024  [facility] and [place] Number:  Producer, television/film/video and Address:  The . [facility], 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   History of DVT (deep vein thrombosis) 04/29/2022   Closed intertrochanteric fracture of left hip, initial encounter [facility])    Closed left hip fracture (HCC) secondary to fall at home 02/08/2022   Paroxysmal atrial fibrillation (HCC) 02/08/2022   Debility 01/08/2022   Constipation 01/07/2022   Physical deconditioning 01/07/2022   Acute metabolic encephalopathy 01/04/2022   Hypomagnesemia 01/04/2022   Pancreatitis 12/30/2021   Parkinson's disease (HCC) 12/21/2021   Parkinson disease (HCC) 12/18/2021   Frequent falls 12/18/2021   History of CVA (cerebrovascular accident) 12/18/2021   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   Hallucinations, visual 07/30/2021   Acetabulum fracture, left (HCC) 06/25/2021   Acute hip pain, left 06/24/2021   Fall at home, initial encounter 06/24/2021   Leukocytosis 06/24/2021   Chronic diastolic heart failure (HCC)    Sinusitis, acute maxillary 06/24/2020   AKI (acute kidney injury) (HCC)    Labile blood glucose    Cough    Benign essential HTN    Skin  lesion of hand    Hypoalbuminemia due to protein-calorie malnutrition (HCC)    Controlled type 2 diabetes mellitus with hyperglycemia, without long-term current use of insulin  (HCC)    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   Morbid obesity (HCC)    Acute blood loss anemia    Acute thalamic infarction (HCC) 04/15/2020   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   TIA (transient ischemic attack) 02/17/2018   Type 2 diabetes mellitus with hyperlipidemia (HCC) 02/17/2018   Insomnia 02/01/2018   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   Chronotropic incompetence 07/09/2014   Dyspnea on exertion 03/03/2014   HLD (hyperlipidemia) 12/13/2007   Essential hypertension 12/13/2007   Class 1 obesity 12/05/2007   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   Coronary atherosclerosis 12/05/2007    Orientation RESPIRATION BLADDER Height & Weight     Self, Time, Place    Incontinent Weight: 94.6 kg Height:     BEHAVIORAL SYMPTOMS/MOOD NEUROLOGICAL BOWEL NUTRITION STATUS      Incontinent Diet (heart healthy with thin liquids)  AMBULATORY STATUS COMMUNICATION OF NEEDS Skin   Extensive Assist Verbally  (stage 1 to coccyx with foam dressing)                       Personal Care Assistance Level of Assistance  Bathing, Feeding, Dressing Bathing Assistance: Maximum assistance Feeding assistance: Limited assistance Dressing Assistance: Maximum assistance     Functional Limitations Info  Sight, Hearing, Speech Sight Info: Impaired Hearing Info: Adequate Speech Info: Adequate    SPECIAL CARE FACTORS FREQUENCY  PT (By licensed PT), OT (By licensed OT)     PT Frequency: 5x/wk OT Frequency: 5x/wk            Contractures Contractures Info: Not present    Additional Factors Info  Code Status, Allergies, Psychotropic, Insulin  Sliding Scale Code Status Info: Full Allergies Info: lisinopril / Amantadines, Clonazepam . Zoloft  Psychotropic Info: Sinemet  CR 50-200  4 times daily/ Seroquel  25 mg in pm/ melatonin 10 mg at bedtime Insulin  Sliding Scale Info: Novolog  0-6 units TID SQ       Current Medications (03/23/2024):  This is the current hospital active medication list Current Facility-Administered Medications  Medication Dose Route Frequency Provider Last Rate Last Admin   atorvastatin  (LIPITOR) tablet 20 mg  20 mg Oral QHS Segars, Jonathan, MD   20 mg at 03/22/24 2104   carbidopa -levodopa  (SINEMET  CR) 50-200 MG per tablet controlled release 1 tablet  1 tablet Oral QID Segars, Jonathan, MD   1 tablet at 03/23/24 1009   insulin  aspart (novoLOG ) injection 0-6 Units  0-6 Units Subcutaneous TID WC Segars, Jonathan, MD   1 Units at 03/23/24 1223   melatonin tablet 10 mg  10 mg Oral QHS Segars, Jonathan, MD   10 mg at 03/22/24 2104   midodrine  (PROAMATINE ) tablet 5 mg  5 mg Oral BID WC Faith Homes, MD   5 mg at 03/22/24 1729   modafinil  (PROVIGIL ) tablet 200 mg  200 mg Oral Daily Segars, Arlyce Lambert, MD   200 mg at 03/23/24 1009   QUEtiapine  (SEROQUEL ) tablet 25 mg  25 mg Oral QPM Segars, Jonathan, MD   25 mg at 03/22/24 1729   rivaroxaban  (XARELTO ) tablet 20 mg  20 mg Oral Daily Girguis, David, MD   20 mg at 03/23/24 1009   sodium chloride  flush (NS) 0.9 % injection 3 mL  3 mL Intravenous Q12H Segars, Arlyce Lambert, MD   3 mL at 03/23/24 1016   tamsulosin  (FLOMAX ) capsule 0.4 mg  0.4 mg Oral QHS Segars, Jonathan, MD   0.4 mg at 03/22/24 2104     Discharge Medications: Please see discharge summary for a list of discharge medications.  Relevant Imaging Results:  Relevant Lab Results:   Additional Information SS# [id number]  Jonathan Neighbor, RN

## 2024-03-23 NOTE — Progress Notes (Signed)
 Patient refused AM lab draw. Patient states he wants to wait for his wife to arrive. Attempts to convince patient to be compliant were unsuccessful

## 2024-03-23 NOTE — Progress Notes (Signed)
 Inpatient Rehabilitation Admissions Coordinator   Per [facility] RN CM request from family,  patient was screened for CIR candidacy by Jeannetta Millman RN MSN. Patient lacks the medical neccesity for CIR level rehab at this time. Therapy is recommending < 3 hrs per day of rehab. Recommend other rehab venues to be pursued.   Jeannetta Millman, RN, MSN Rehab Admissions Coordinator [phone] 03/23/2024 1:52 PM

## 2024-03-23 NOTE — TOC Initial Note (Addendum)
 Transition of Care [facility]) - Initial/Assessment Note    Patient Details  Name: Jordan [name] MRN: [id number] Date of Birth: 1940/11/27  [facility] CM/SW Contact:    Jonathan Neighbor, RN Phone Number: 03/23/2024, 1:39 PM  Clinical Narrative:                  CM spoke to patients spouse about SNF rehab recommendations. Pt has recently been to [facility] IR and wife prefers he return to their program. CM has sent the referral through the H[facility]. Awaiting for [facility] to review.  If pt unable to attend an IR wife prefers [name] SNF. TOC following.  1550: [name] doesn't have any beds available. Wife willing to look at [facility] but they are not in network with pts insurance. CM has asked [name] to keep pt on their list if bed becomes available next week.  In case pt discharges home: Home health arranged with [facility] through the [place]. Information on the AVS.  Orders sent to the [facility] for transport chair also.  Pt has at home: bed/ BSC/ walker/ gait belt  TOC following.  Expected Discharge Plan: IP Rehab Facility Barriers to Discharge: Continued Medical Work up   Patient Goals and CMS Choice   CMS Medicare.gov Compare Post Acute Care list provided to:: Patient Represenative (must comment) Choice offered to / list presented to : Spouse      Expected Discharge Plan and Services   Discharge Planning Services: CM Consult Post Acute Care Choice: IP Rehab Living arrangements for the past 2 months: Single Family Home                                      Prior Living Arrangements/Services Living arrangements for the past 2 months: Single Family Home Lives with:: Spouse Patient language and need for interpreter reviewed:: Yes Do you feel safe going back to the place where you live?: Yes            Criminal Activity/Legal Involvement Pertinent to Current Situation/Hospitalization: No - Comment as needed  Activities of Daily Living      Permission  Sought/Granted                  Emotional Assessment           Psych Involvement: No (comment)  Admission diagnosis:  Focal neurological deficit [R29.818] Stroke-like symptom [R29.90] Encephalopathy, unspecified type [G93.40] Patient Active Problem List   Diagnosis Date Noted   Stroke-like symptom 03/21/2024   Acute encephalopathy 02/25/2024   OSA on CPAP 02/25/2024   Symptomatic anemia 02/09/2024   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   History of DVT (deep vein thrombosis) 04/29/2022   Closed intertrochanteric fracture of left hip, initial encounter (HCC)    Closed left hip fracture (HCC) secondary to fall at home 02/08/2022   Paroxysmal atrial fibrillation (HCC) 02/08/2022   Debility 01/08/2022   Constipation 01/07/2022   Physical deconditioning 01/07/2022   Acute metabolic encephalopathy 01/04/2022   Hypomagnesemia 01/04/2022   Pancreatitis 12/30/2021   Parkinson's disease (HCC) 12/21/2021   Parkinson disease (HCC) 12/18/2021   Frequent falls 12/18/2021   History of CVA (cerebrovascular accident) 12/18/2021   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   Hallucinations, visual 07/30/2021   Acetabulum fracture, left (HCC) 06/25/2021   Acute hip pain, left 06/24/2021   Fall at home, initial encounter 06/24/2021  Leukocytosis 06/24/2021   Chronic diastolic heart failure (HCC)    Sinusitis, acute maxillary 06/24/2020   AKI (acute kidney injury) (HCC)    Labile blood glucose    Cough    Benign essential HTN    Skin lesion of hand    Hypoalbuminemia due to protein-calorie malnutrition (HCC)    Controlled type 2 diabetes mellitus with hyperglycemia, without long-term current use of insulin  (HCC)    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   Morbid obesity (HCC)    Acute blood loss anemia    Acute thalamic infarction (HCC) 04/15/2020   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   TIA (transient ischemic attack) 02/17/2018    Type 2 diabetes mellitus with hyperlipidemia (HCC) 02/17/2018   Insomnia 02/01/2018   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   Chronotropic incompetence 07/09/2014   Dyspnea on exertion 03/03/2014   HLD (hyperlipidemia) 12/13/2007   Essential hypertension 12/13/2007   Class 1 obesity 12/05/2007   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   Coronary atherosclerosis 12/05/2007   PCP:  Roselind Congo, MD Pharmacy:   [facility] [phone] Jonette Nestle, [place] - [address] AT [facility] [address] [place] [address] Phone: [phone] Fax: [phone]  [facility] - [place], [place] - [address] [place] [address] Phone: [phone] Fax: [phone]     Social Drivers of Health (SDOH) Social History: SDOH Screenings   Food Insecurity: No Food Insecurity (03/22/2024)  Housing: Low Risk  (03/22/2024)  Transportation Needs: No Transportation Needs (03/22/2024)  Utilities: Not At Risk (03/22/2024)  Depression (PHQ2-9): Low Risk  (04/12/2023)  Social Connections: Moderately Integrated (03/22/2024)  Tobacco Use: Medium Risk (03/21/2024)   SDOH Interventions:     Readmission Risk Interventions     No data to display

## 2024-03-23 NOTE — Progress Notes (Addendum)
 Progress Note    Jordan [name]   YQI:[id number]  DOB: July 01, 1941  DOA: 03/21/2024     2 PCP: Roselind Congo, MD  Initial CC: right side weakness, aphasia  Hospital Course: Jordan [name] is an 83 year old male with PMH CVA, DM II, chronic diastolic CHF, arthritis, Parkinson's disease, CAD, DVT, gout, HTN, HLD, OSA on CPAP, metastatic neuroendocrine tumor who presented with right sided weakness and aphasia. He was recently hospitalized 4/26 - 5/2 for encephalopathy that was felt to be due to medication side effect of Seroquel .  CT head and EEG were also unremarkable at that time. His Seroquel  was adjusted to be given at 5 PM which helped with hallucinations/agitation without oversedation.  This hospitalization he presented with aphasia and right side weakness.  Symptoms were slowly improving upon admission.  He was admitted for repeat stroke workup.  Interval History:  No events overnight.  Still has what looks like baseline slowed mentation from underlying Parkinson's. Updated wife on phone this afternoon.  Assessment and Plan: * TIA (transient ischemic attack) - possible TIA given aphasia and right sided weakness that was slowly improving on admission; does not seem to have any obvious deficits on exam today - MRI brain also negative for acute stroke; CT head on admission shows remote infarct involving anterior left frontal lobe and remote lacunar infarcts in the cerebellum bilaterally - no clot on echo  - Continue Lipitor - Resume Xarelto  - PT/OT recommending SNF; will see if able to get him back to [facility] vs [facility], if not, then maybe a SNF that is agreeable vs home if no good options  Physical deconditioning - PT/OT recommending SNF  Parkinson's disease (HCC) - Continue Sinemet  and modafinil  -Continue Seroquel  at 5 PM - With recurrent hospitalizations and slow progressive decline, may need to also start considering more contribution from underlying Parkinson's and that  goals of care may need to continue to be discussed as time goes forward  History of DVT (deep vein thrombosis) - Continue Xarelto   History of CVA (cerebrovascular accident) - per [facility]: "Remote infarct in the anterior left frontal lobe and remote lacunar infarcts in the cerebellum bilaterally." - continue Lipitor and Xarelto   Essential hypertension - On midodrine  at home with parameters to hold if SBP greater than 130  Paroxysmal atrial fibrillation (HCC) - Continue Xarelto  -Rate controlled  Chronic diastolic heart failure (HCC) - No signs/symptoms of exacerbation - Follow-up repeat echo -Last echo 01/06/2022 showed EF 60 to 65%, indeterminate diastolic parameters  Type 2 diabetes mellitus with hyperlipidemia (HCC) - A1c 6.4% - Continue SSI and CBG monitoring  HLD (hyperlipidemia) - LDL 55 - Continue Lipitor  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 If pt undergoes a cardiac device procedure during that time, they should be re-cleared.   Tachy-therapies to be programmed off if applicable with device back to pre-MRI settings after completion of exam. 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; restaging CT in 01/2024 showed stable disease  - Remains under observation    Old records reviewed in assessment of this  patient  Antimicrobials:   DVT prophylaxis:  SCDs Start: 03/21/24 2102 rivaroxaban  (XARELTO ) tablet 20 mg   Code Status:   Code Status: Full Code  Mobility Assessment (Last 72 Hours)     Mobility Assessment     Row Name 03/23/24 1100 03/23/24 0816 03/22/24 2000 03/22/24 16:06:10 03/22/24 1433   Does patient have an order for bedrest or is patient medically unstable -- No - Continue assessment No - Continue assessment No - Continue assessment --   What is the highest level of mobility based on the progressive mobility assessment? Level 3 (Stands with assist) - Balance while standing  and cannot march in place Level 2 (Chairfast) - Balance while sitting on edge of bed and cannot stand Level 2 (Chairfast) - Balance while sitting on edge of bed and cannot stand Level 2 (Chairfast) - Balance while sitting on edge of bed and cannot stand Level 2 (Chairfast) - Balance while sitting on edge of bed and cannot stand   Is the above level different from baseline mobility prior to current illness? -- Yes - Recommend PT order Yes - Recommend PT order Yes - Recommend PT order --            Barriers to discharge: none Disposition Plan:  TBD HH orders placed: TBD Status is: Inpt  Objective: Blood pressure 103/62, pulse 69, temperature 97.8 F (36.6 C), temperature source Oral, resp. rate 17, weight 94.6 kg, SpO2 94%.  Examination:  Physical Exam Constitutional:      General: He is not in acute distress.    Appearance: Normal appearance. He is obese.     Comments: Grossly deconditioned appearing  HENT:     Head: Normocephalic and atraumatic.     Mouth/Throat:     Mouth: Mucous membranes are moist.  Eyes:     Extraocular Movements: Extraocular movements intact.  Cardiovascular:     Rate and Rhythm: Normal rate and regular rhythm.  Pulmonary:     Effort: Pulmonary effort is normal. No respiratory distress.     Breath sounds: Normal breath sounds. No wheezing.  Abdominal:     General:  Bowel sounds are normal. There is no distension.     Palpations: Abdomen is soft.     Tenderness: There is no abdominal tenderness.  Musculoskeletal:        General: Normal range of motion.     Cervical back: Normal range of motion and neck supple.  Skin:    General: Skin is warm and dry.  Neurological:     Mental Status: He is alert.     Comments: No focal deficits.  Tremor appreciated in hands.  Slow mentation  Psychiatric:        Mood and Affect: Mood normal.      Consultants:  Neurology  Procedures:    Data Reviewed: Results for orders placed or performed during the hospital encounter of 03/21/24 (from the past 24 hours)  Glucose, capillary     Status: Abnormal   Collection Time: 03/22/24  5:22 PM  Result Value Ref Range   Glucose-Capillary 140 (H) 70 - 99 mg/dL  Hemoglobin Z6X     Status: Abnormal   Collection Time: 03/22/24  5:51 PM  Result Value Ref Range   Hgb A1c MFr  Bld 6.4 (H) 4.8 - 5.6 %   Mean Plasma Glucose 136.98 mg/dL  Glucose, capillary     Status: Abnormal   Collection Time: 03/23/24  8:38 AM  Result Value Ref Range   Glucose-Capillary 122 (H) 70 - 99 mg/dL  Basic metabolic panel with GFR     Status: Abnormal   Collection Time: 03/23/24 10:51 AM  Result Value Ref Range   Sodium 140 135 - 145 mmol/L   Potassium 3.5 3.5 - 5.1 mmol/L   Chloride 105 98 - 111 mmol/L   CO2 26 22 - 32 mmol/L   Glucose, Bld 154 (H) 70 - 99 mg/dL   BUN 7 (L) 8 - 23 mg/dL   Creatinine, Ser 7.82 0.61 - 1.24 mg/dL   Calcium  9.2 8.9 - 10.3 mg/dL   GFR, Estimated >95 >62 mL/min   Anion gap 9 5 - 15  CBC with Differential/Platelet     Status: Abnormal   Collection Time: 03/23/24 10:51 AM  Result Value Ref Range   WBC 7.9 4.0 - 10.5 K/uL   RBC 4.49 4.22 - 5.81 MIL/uL   Hemoglobin 12.5 (L) 13.0 - 17.0 g/dL   HCT 13.0 (L) 86.5 - 78.4 %   MCV 85.1 80.0 - 100.0 fL   MCH 27.8 26.0 - 34.0 pg   MCHC 32.7 30.0 - 36.0 g/dL   RDW 69.6 (H) 29.5 - 28.4 %   Platelets 225 150 - 400  K/uL   nRBC 0.0 0.0 - 0.2 %   Neutrophils Relative % 57 %   Neutro Abs 4.6 1.7 - 7.7 K/uL   Lymphocytes Relative 27 %   Lymphs Abs 2.2 0.7 - 4.0 K/uL   Monocytes Relative 10 %   Monocytes Absolute 0.8 0.1 - 1.0 K/uL   Eosinophils Relative 5 %   Eosinophils Absolute 0.4 0.0 - 0.5 K/uL   Basophils Relative 1 %   Basophils Absolute 0.1 0.0 - 0.1 K/uL   Immature Granulocytes 0 %   Abs Immature Granulocytes 0.02 0.00 - 0.07 K/uL  Magnesium      Status: Abnormal   Collection Time: 03/23/24 10:51 AM  Result Value Ref Range   Magnesium  1.6 (L) 1.7 - 2.4 mg/dL  Glucose, capillary     Status: Abnormal   Collection Time: 03/23/24 11:14 AM  Result Value Ref Range   Glucose-Capillary 170 (H) 70 - 99 mg/dL   Comment 1 Notify RN    Comment 2 Document in Chart     I have reviewed pertinent nursing notes, vitals, labs, and images as necessary. I have ordered labwork to follow up on as indicated.  I have reviewed the last notes from staff over past 24 hours. I have discussed patient's care plan and test results with nursing staff, CM/SW, and other staff as appropriate.  Time spent: Greater than 50% of the 55 minute visit was spent in counseling/coordination of care for the patient as laid out in the A&P.   LOS: 2 days   Faith Homes, MD [facility] 03/23/2024, 1:33 PM

## 2024-03-23 NOTE — Evaluation (Signed)
 Occupational Therapy Evaluation Patient Details Name: Jordan [name] MRN: [id number] DOB: December 29, 1940 Today's Date: 03/23/2024   History of Present Illness   Pt is 83 yo presenting to [facility] ED on 5/21 due to R sided weakness and aphasia; MRI brain also negative for acute stroke; recent hospitalization.   PMHx: Parkinsons, HLD, OSA, DM, HTN, chronic diastolic HF, CVA, frequent falls, L2 compression fx, L hip fx, metastatic neuroendocrine tumor.     Clinical Impressions Pt's wife called for PLOF as pt confused, stating he was frustrated because he didn't know where he was and he hadn't seen his wife in 2 days. Pt discharged home from [facility] rehab 5/20, went to a follow up MD appointment 5/21 with his wife, which led to this hospital admission. Prior to his recent admission 02/27/2024, Jordan [name] was primarily using a transport chair for mobility in the home due to a decline in balance and ability to ambulate, with wife able to assist with transfers and ADL tasks. Pt required Mod A to mobilize to EOB and mod A to stand. Pt unable to take steps to chair and knees began to buckle. Pt eventually mobilized to chair with use of Stedy(lift equipment). Requires overall Max A with ADL tasks. Note decrease in systolic BP from 409 to 103; increase to 115 once in chair - nsg aware. Pt will need to be able to transfer safely to a chair without use of lift equipment in order to DC home. Pt was able to talk with his wife over the phone during session which appeared to help his anxiety. Acute OT to follow. .      If plan is discharge home, recommend the following:   Two people to help with walking and/or transfers;A lot of help with bathing/dressing/bathroom;Assistance with cooking/housework;Assistance with feeding;Direct supervision/assist for medications management;Assist for transportation;Help with stairs or ramp for entrance     Functional Status Assessment   Patient has had a recent decline in their functional  status and demonstrates the ability to make significant improvements in function in a reasonable and predictable amount of time.     Equipment Recommendations   None recommended by OT     Recommendations for Other Services         Precautions/Restrictions   Precautions Precautions: Fall Recall of Precautions/Restrictions: Impaired Restrictions Weight Bearing Restrictions Per Provider Order: No     Mobility Bed Mobility Overal bed mobility: Needs Assistance Bed Mobility: Supine to Sit, Sit to Supine     Supine to sit: Mod assist          Transfers Overall transfer level: Needs assistance Equipment used: Rolling walker (2 wheels) Transfers: Sit to/from Stand Sit to Stand: Min assist           General transfer comment: unable to step pivot; took 2 steps forward and B knees began to buckle; sat on bed; required use of Acupuncturist via Lift Equipment: Stedy    Balance Overall balance assessment: History of Falls, Needs assistance Sitting-balance support: Bilateral upper extremity supported, Feet unsupported, Feet supported Sitting balance-Leahy Scale: Fair     Standing balance support: Bilateral upper extremity supported, During functional activity Standing balance-Leahy Scale: Poor                             ADL either performed or assessed with clinical judgement   ADL Overall ADL's : Needs assistance/impaired Eating/Feeding: Sitting;Minimal assistance   Grooming: Wash/dry hands;Wash/dry face;Oral care;Minimal assistance;Sitting  Grooming Details (indicate cue type and reason): assistance to load toothbrush Upper Body Bathing: Moderate assistance;Sitting   Lower Body Bathing: Maximal assistance;Sitting/lateral leans;Sit to/from stand;+2 for safety/equipment   Upper Body Dressing : Moderate assistance;Sitting   Lower Body Dressing: Maximal assistance;+2 for physical assistance;+2 for safety/equipment;Sit to/from stand   Toilet  Transfer: Maximal assistance Toilet Transfer Details (indicate cue type and reason): use of Stedy Toileting- Clothing Manipulation and Hygiene: Maximal assistance       Functional mobility during ADLs: Moderate assistance       Vision Baseline Vision/History: 1 Wears glasses Ability to See in Adequate Light: 1 Impaired Additional Comments: will furtehr assess; not seeing everything on tray     Perception         Praxis         Pertinent Vitals/Pain Pain Assessment Pain Assessment: Faces Faces Pain Scale: Hurts a little bit Pain Location: scrotum Pain Descriptors / Indicators: Discomfort, Grimacing Pain Intervention(s): Limited activity within patient's tolerance     Extremity/Trunk Assessment Upper Extremity Assessment Upper Extremity Assessment: Generalized weakness (decreased fine motor/coordination B hands; able to grasp built up utensils and hold phone)   Lower Extremity Assessment Lower Extremity Assessment: Defer to PT evaluation   Cervical / Trunk Assessment Cervical / Trunk Assessment: Kyphotic   Communication Communication Communication: Impaired Factors Affecting Communication: Hearing impaired;Difficulty expressing self   Cognition Arousal: Alert Behavior During Therapy: Flat affect Cognition: History of cognitive impairments, Cognition impaired   Orientation impairments: Place, Situation Awareness: Intellectual awareness impaired, Online awareness impaired Memory impairment (select all impairments): Short-term memory, Working [employer] fast streamer, Engineer, structural memory Attention impairment (select first level of impairment): Selective attention Executive functioning impairment (select all impairments): Initiation, Organization, Reasoning, Problem solving OT - Cognition Comments: wife states Jordan [name] has been more confused                 Following commands: Impaired Following commands impaired: Follows one step commands with increased time      Cueing  General Comments   Cueing Techniques: Verbal cues;Gestural cues;Tactile cues;Visual cues      Exercises     Shoulder Instructions      Home Living Family/patient expects to be discharged to:: Private residence Living Arrangements: Spouse/significant other;Children Available Help at Discharge: Family;Available 24 hours/day Type of Home: Other(Comment) (condo) Home Access: Level entry;Elevator     Home Layout: One level     Bathroom Shower/Tub: Chief Strategy Officer: Handicapped height Bathroom Accessibility: Yes How Accessible: Other (comment) (via transport chair) Home Equipment: Rolling Walker (2 wheels);Shower seat;Other (comment);Grab bars - tub/shower;Grab bars - toilet;Lift chair;BSC/3in1          Prior Functioning/Environment Prior Level of Function : Needs assist  Cognitive Assist : Mobility (cognitive);ADLs (cognitive) Mobility (Cognitive): Intermittent cues ADLs (Cognitive): Intermittent cues Physical Assist : ADLs (physical);Mobility (physical) Mobility (physical): Transfers;Gait;Bed mobility ADLs (physical): Bathing;Dressing;IADLs;Toileting;Feeding;Grooming Mobility Comments: primarily uses transport chair for home and community due to BP and risk for falls ADLs Comments: wife assists with all ADL tasks; has had increased difficulty with self feeding - has adapted utensils    OT Problem List: Decreased strength;Decreased activity tolerance;Impaired balance (sitting and/or standing);Decreased cognition;Decreased safety awareness;Decreased knowledge of use of DME or AE;Decreased range of motion;Decreased coordination;Impaired vision/perception;Cardiopulmonary status limiting activity;Obesity;Impaired UE functional use   OT Treatment/Interventions: Self-care/ADL training;Therapeutic exercise;Energy conservation;DME and/or AE instruction;Therapeutic activities;Patient/family education;Balance training      OT Goals(Current goals can be  found in the care plan section)   Acute Rehab OT  Goals Patient Stated Goal: to talk with wife OT Goal Formulation: With patient/family Time For Goal Achievement: 04/06/24 Potential to Achieve Goals: Good ADL Goals Pt Will Perform Eating: with set-up;sitting Pt Will Perform Grooming: with set-up;sitting   OT Frequency:  Min 2X/week    Co-evaluation              AM-PAC OT "6 Clicks" Daily Activity     Outcome Measure Help from another person eating meals?: A Little Help from another person taking care of personal grooming?: A Lot Help from another person toileting, which includes using toliet, bedpan, or urinal?: A Lot Help from another person bathing (including washing, rinsing, drying)?: A Lot Help from another person to put on and taking off regular upper body clothing?: A Lot Help from another person to put on and taking off regular lower body clothing?: A Lot 6 Click Score: 13   End of Session Equipment Utilized During Treatment: Gait belt;Rolling walker (2 wheels) Nurse Communication: Mobility status;Need for lift equipment Octaviano Belts)  Activity Tolerance: Patient tolerated treatment well Patient left: in chair;with call bell/phone within reach;with chair alarm set  OT Visit Diagnosis: Unsteadiness on feet (R26.81);Other abnormalities of gait and mobility (R26.89);Muscle weakness (generalized) (M62.81);Other symptoms and signs involving cognitive function                Time: 1140-1239 OT Time Calculation (min): 59 min Charges:  OT General Charges $OT Visit: 1 Visit OT Evaluation $OT Eval Moderate Complexity: 1 Mod OT Treatments $Self Care/Home Management : 38-52 mins  Milburn Aliment, OT/L   Acute OT Clinical Specialist Acute Rehabilitation Services Pager [phone] Office [phone]   [facility] 03/23/2024, 1:00 PM

## 2024-03-23 NOTE — Progress Notes (Signed)
 STROKE TEAM PROGRESS NOTE   INTERIM HISTORY/SUBJECTIVE No family at the bedside.  Patient sitting in bed   in no apparent distress.  Patient states he feels better Echocardiogram shows ejection fraction of 55 to 60%.  Left atrial size is normal. OBJECTIVE  CBC    Component Value Date/Time   WBC 7.9 03/23/2024 1051   RBC 4.49 03/23/2024 1051   HGB 12.5 (L) 03/23/2024 1051   HGB 10.1 (L) 02/07/2024 1518   HCT 38.2 (L) 03/23/2024 1051   HCT 32.7 (L) 02/07/2024 1518   PLT 225 03/23/2024 1051   PLT 305 02/07/2024 1518   MCV 85.1 03/23/2024 1051   MCH 27.8 03/23/2024 1051   MCHC 32.7 03/23/2024 1051   RDW 18.7 (H) 03/23/2024 1051   LYMPHSABS 2.2 03/23/2024 1051   MONOABS 0.8 03/23/2024 1051   EOSABS 0.4 03/23/2024 1051   BASOSABS 0.1 03/23/2024 1051    BMET    Component Value Date/Time   NA 140 03/23/2024 1051   NA 145 07/23/2021 0000   K 3.5 03/23/2024 1051   CL 105 03/23/2024 1051   CO2 26 03/23/2024 1051   GLUCOSE 154 (H) 03/23/2024 1051   BUN 7 (L) 03/23/2024 1051   BUN 14 07/23/2021 0000   CREATININE 0.77 03/23/2024 1051   CREATININE 1.02 12/13/2023 1221   CREATININE 1.16 03/18/2016 1011   CALCIUM  9.2 03/23/2024 1051   GFRNONAA >60 03/23/2024 1051   GFRNONAA >60 12/13/2023 1221    IMAGING past 24 hours ECHOCARDIOGRAM COMPLETE Result Date: 03/22/2024    ECHOCARDIOGRAM REPORT   Patient Name:   Jordan [name] Date of Exam: 03/22/2024 Medical Rec #:  [id number]       Height:       69.0 in Accession #:    [id number]      Weight:       208.6 lb Date of Birth:  04-01-1941       BSA:          2.103 m Patient Age:    83 years        BP:           133/87 mmHg Patient Gender: M               HR:           68 bpm. Exam Location:  Inpatient Procedure: 2D Echo, Cardiac Doppler, Color Doppler, Intracardiac Opacification            Agent and Saline Contrast Bubble Study (Both Spectral and Color Flow            Doppler were utilized during procedure). Indications:    Stroke  History:         Patient has prior history of Echocardiogram examinations, most                 recent 01/06/2022. Signs/Symptoms:Shortness of Breath; Risk                 Factors:Sleep Apnea, Dyslipidemia, Hypertension and Diabetes.                 DVT, DOE.  Sonographer:    Juanita Shaw Referring Phys: [id number] DEVON SHAFER IMPRESSIONS  1. Left ventricular ejection fraction, by estimation, is 55 to 60%. The left ventricle has normal function. Left ventricular endocardial border not optimally defined to evaluate regional wall motion. Left ventricular diastolic parameters are indeterminate.  2. Right ventricular systolic function is normal. The right ventricular size is normal. There  is normal pulmonary artery systolic pressure.  3. The mitral valve was not well visualized. No evidence of mitral valve regurgitation. No evidence of mitral stenosis.  4. The aortic valve was not well visualized. There is moderate calcification of the aortic valve. Aortic valve regurgitation is not visualized. No aortic stenosis is present.  5. The inferior vena cava is normal in size with greater than 50% respiratory variability, suggesting right atrial pressure of 3 mmHg. FINDINGS  Left Ventricle: Left ventricular ejection fraction, by estimation, is 55 to 60%. The left ventricle has normal function. Left ventricular endocardial border not optimally defined to evaluate regional wall motion. The left ventricular internal cavity size was normal in size. There is no left ventricular hypertrophy. Left ventricular diastolic function could not be evaluated due to atrial fibrillation. Left ventricular diastolic parameters are indeterminate. Right Ventricle: The right ventricular size is normal. No increase in right ventricular wall thickness. Right ventricular systolic function is normal. There is normal pulmonary artery systolic pressure. The tricuspid regurgitant velocity is 2.02 m/s, and  with an assumed right atrial pressure of 3 mmHg, the estimated  right ventricular systolic pressure is 19.3 mmHg. Left Atrium: Left atrial size was normal in size. Right Atrium: Right atrial size was normal in size. Pericardium: There is no evidence of pericardial effusion. Mitral Valve: The mitral valve was not well visualized. Mild mitral annular calcification. No evidence of mitral valve regurgitation. No evidence of mitral valve stenosis. MV peak gradient, 4.9 mmHg. The mean mitral valve gradient is 2.0 mmHg. Tricuspid Valve: The tricuspid valve is not well visualized. Tricuspid valve regurgitation is not demonstrated. No evidence of tricuspid stenosis. Aortic Valve: The aortic valve was not well visualized. There is moderate calcification of the aortic valve. There is moderate aortic valve annular calcification. Aortic valve regurgitation is not visualized. No aortic stenosis is present. Aortic valve mean gradient measures 2.0 mmHg. Aortic valve peak gradient measures 4.8 mmHg. Aortic valve area, by VTI measures 3.30 cm. Pulmonic Valve: The pulmonic valve was not well visualized. Pulmonic valve regurgitation is not visualized. No evidence of pulmonic stenosis. Aorta: The aortic root is normal in size and structure. Venous: The inferior vena cava is normal in size with greater than 50% respiratory variability, suggesting right atrial pressure of 3 mmHg. IAS/Shunts: No atrial level shunt detected by color flow Doppler.  LEFT VENTRICLE PLAX 2D LVIDd:         4.90 cm      Diastology LVIDs:         3.30 cm      LV e' medial:    5.44 cm/s LV PW:         0.90 cm      LV E/e' medial:  12.8 LV IVS:        0.90 cm      LV e' lateral:   4.51 cm/s LVOT diam:     1.80 cm      LV E/e' lateral: 15.5 LV SV:         52 LV SV Index:   25 LVOT Area:     2.54 cm  LV Volumes (MOD) LV vol d, MOD A2C: 185.0 ml LV vol d, MOD A4C: 154.0 ml LV vol s, MOD A2C: 48.6 ml LV vol s, MOD A4C: 52.7 ml LV SV MOD A2C:     136.4 ml LV SV MOD A4C:     154.0 ml LV SV MOD BP:      114.2 ml RIGHT VENTRICLE  IVC RV Basal diam:  3.50 cm     IVC diam: 1.50 cm RV Mid diam:    2.70 cm RV S prime:     11.30 cm/s LEFT ATRIUM             Index        RIGHT ATRIUM          Index LA diam:        3.70 cm 1.76 cm/m   RA Area:     6.97 cm LA Vol (A2C):   21.1 ml 10.03 ml/m  RA Volume:   7.65 ml  3.64 ml/m LA Vol (A4C):   57.2 ml 27.20 ml/m LA Biplane Vol: 36.6 ml 17.40 ml/m  AORTIC VALVE                    PULMONIC VALVE AV Area (Vmax):    2.47 cm     PV Vmax:       0.60 m/s AV Area (Vmean):   2.50 cm     PV Peak grad:  1.5 mmHg AV Area (VTI):     3.30 cm AV Vmax:           109.00 cm/s AV Vmean:          66.900 cm/s AV VTI:            0.159 m AV Peak Grad:      4.8 mmHg AV Mean Grad:      2.0 mmHg LVOT Vmax:         106.00 cm/s LVOT Vmean:        65.800 cm/s LVOT VTI:          0.206 m LVOT/AV VTI ratio: 1.30  AORTA Ao Root diam: 2.90 cm Ao Asc diam:  2.80 cm MITRAL VALVE                TRICUSPID VALVE MV Area (PHT): 2.10 cm     TR Peak grad:   16.3 mmHg MV Area VTI:   2.09 cm     TR Vmax:        202.00 cm/s MV Peak grad:  4.9 mmHg MV Mean grad:  2.0 mmHg     SHUNTS MV Vmax:       1.11 m/s     Systemic VTI:  0.21 m MV Vmean:      68.0 cm/s    Systemic Diam: 1.80 cm MV Decel Time: 362 msec MV E velocity: 69.80 cm/s MV A velocity: 105.00 cm/s MV E/A ratio:  0.66 Aditya Sabharwal Electronically signed by Alwin Baars Signature Date/Time: 03/22/2024/5:14:37 PM    Final     Vitals:   03/22/24 2027 03/23/24 0830 03/23/24 1113 03/23/24 1231  BP: 127/71 (!) 166/80 123/64 103/62  Pulse: 63 65 69   Resp:  20 17   Temp: (!) 97.4 F (36.3 C) 98.4 F (36.9 C) 97.8 F (36.6 C)   TempSrc: Oral Oral Oral   SpO2: 93% 95% 94%   Weight:         PHYSICAL EXAM General: Ackley ill male Psych:  Mood and affect appropriate for situation CV: Regular rate and rhythm on monitor Respiratory:  Regular, unlabored respirations on room air GI: Abdomen soft and nontender   NEURO:  Mental Status: AA&Ox3, patient is able  to give clear and coherent history Speech/Language: speech is without dysarthria or aphasia.  Naming, repetition, fluency, and comprehension intact.  Cranial Nerves:  II: PERRL. Visual fields  full.  III, IV, VI: EOMI. Eyelids elevate symmetrically.  V: Sensation is intact to light touch and symmetrical to face.  VII: Face is symmetrical resting and smiling VIII: hearing intact to voice. IX, X: Palate elevates symmetrically. Phonation is normal.  ZO:[id number] shrug 5/5. XII: tongue is midline without fasciculations. Motor: 5/5 strength to all muscle groups tested.  Tone: is normal and bulk is normal Sensation- Intact to light touch bilaterally. Extinction absent to light touch to DSS.   Coordination: FTN intact bilaterally, HKS: no ataxia in BLE.No drift.  Gait- deferred  Most Recent NIH 0   ASSESSMENT/PLAN  [name] is a 83 y.o. male with history of Parkinson's disease, s/p pacemaker CHF, CAD, history of DVT on Xarelto , gout, hyperlipidemia, hypertension, narcolepsy, OSA on CPAP, stroke, diabetes admitted for right-sided weakness and aphasia.  NIH on Admission 16  Left hemispheric TIA likely small vessel disease  Code Stroke  CT head No acute abnormality.   Remote infarct in the anterior left frontal lobe and remote lacunar infarcts in the cerebellum bilaterally Small vessel disease. Atrophy.  ASPECTS 10.     CTA head & neck no LVO. Focal severe stenosis of the P2 segment right PCA. Otherwise no high-grade stenosis identified. MRI no acute process 2D Echo EF 55 to 60%.  Left atrial size normal. LDL 55 HgbA1c 6.8 VTE prophylaxis -SCDs Xarelto  (rivaroxaban ) daily prior to admission, now on Xarelto  (rivaroxaban ) daily Therapy recommendations:  Pending Disposition: Pending  Hx of Stroke/TIA Parkinson's disease Remote infarct in the anterior left frontal lobe and remote lacunar infarcts in the cerebellum bilaterally  On Sinemet , Provigil , Seroquel  at night  History of  DVT Sinew Xarelto    CAD CHF S/p PPM Orthostatic hypotension Home meds: Midodrine  5 mg Stable Blood Pressure Goal: SBP less than 160   Hyperlipidemia Home meds: Atorvastatin  20 mg, resumed in hospital LDL 55, goal < 70 Continue statin at discharge  Diabetes type II Controlled Home meds: Metformin  HgbA1c 6.8, goal < 7.0 CBGs SSI Recommend close follow-up with PCP for better DM control   Dysphagia Patient has post-stroke dysphagia, SLP consulted    Diet   Diet Heart Room service appropriate? Yes; Fluid consistency: Thin   Advance diet as tolerated  Other Stroke Risk Factors Obesity, Body mass index is 30.8 kg/m., BMI >/= 30 associated with increased stroke risk, recommend weight loss, diet and exercise as appropriate  Coronary artery disease Congestive heart failure Obstructive sleep apnea, on CPAP at home    Other Active Problems Metastatic neuroendocrine tumor  Hospital day # 2    Patient with history of Parkinson disease presented with transient right-sided weakness possibly left hemispheric TIA from small vessel disease.  Seems to have returned to his baseline and MRI shows no acute stroke.  Continue Xarelto  for stroke prevention.  Aggressive risk factor modification.  No family at the bedside.  Discussed with Dr Gladstone Lamer.  Stroke team will sign off.  Kindly call for questions.  Greater than 50% time during this 35-minute visit was spent on counseling and coordination of care about TIA and discussion about stroke prevention and treatment and answering questions.  Ardella Beaver, MD Medical Director [facility] Pager: [phone] 03/23/2024 1:58 PM  To contact Stroke Continuity provider, please refer to [email or web address]. After hours, contact General Neurology

## 2024-03-23 NOTE — Plan of Care (Signed)
  Problem: Education: Goal: Knowledge of disease or condition will improve Outcome: Progressing   Problem: Ischemic Stroke/TIA Tissue Perfusion: Goal: Complications of ischemic stroke/TIA will be minimized Outcome: Progressing   Problem: Coping: Goal: Will verbalize positive feelings about self Outcome: Progressing

## 2024-03-24 DIAGNOSIS — G20A1 Parkinson's disease without dyskinesia, without mention of fluctuations: Secondary | ICD-10-CM | POA: Diagnosis not present

## 2024-03-24 DIAGNOSIS — G459 Transient cerebral ischemic attack, unspecified: Secondary | ICD-10-CM | POA: Diagnosis not present

## 2024-03-24 DIAGNOSIS — R5381 Other malaise: Secondary | ICD-10-CM | POA: Diagnosis not present

## 2024-03-24 LAB — GLUCOSE, CAPILLARY
Glucose-Capillary: 186 mg/dL — ABNORMAL HIGH (ref 70–99)
Glucose-Capillary: 200 mg/dL — ABNORMAL HIGH (ref 70–99)
Glucose-Capillary: 209 mg/dL — ABNORMAL HIGH (ref 70–99)

## 2024-03-24 MED ORDER — QUETIAPINE FUMARATE 25 MG PO TABS
25.0000 mg | ORAL_TABLET | Freq: Two times a day (BID) | ORAL | Status: DC
  Administered 2024-03-24 – 2024-03-25 (×2): 25 mg via ORAL
  Filled 2024-03-24 (×2): qty 1

## 2024-03-24 NOTE — Progress Notes (Signed)
 Progress Note    Jordan [name]   ZOX:[id number]  DOB: November 24, 1940  DOA: 03/21/2024     3 PCP: Roselind Congo, MD  Initial CC: right side weakness, aphasia  Hospital Course: Jordan [name] is an 83 year old male with PMH CVA, DM II, chronic diastolic CHF, arthritis, Parkinson's disease, CAD, DVT, gout, HTN, HLD, OSA on CPAP, metastatic neuroendocrine tumor who presented with right sided weakness and aphasia. He was recently hospitalized 4/26 - 5/2 for encephalopathy that was felt to be due to medication side effect of Seroquel .  CT head and EEG were also unremarkable at that time. His Seroquel  was adjusted to be given at 5 PM which helped with hallucinations/agitation without oversedation.  This hospitalization he presented with aphasia and right side weakness.  Symptoms were slowly improving upon admission.  He was admitted for repeat stroke workup.  Interval History:  No events overnight.  Confused this morning and does appear to be more consistent with dementia; he also became more agitated and trying to get out of bed this afternoon but had visitors soon after and was noted to calm down with the distraction.   Assessment and Plan: * TIA (transient ischemic attack) - possible TIA given aphasia and right sided weakness that was slowly improving on admission; does not seem to have any obvious deficits on exam  - MRI brain also negative for acute stroke; CT head on admission shows remote infarct involving anterior left frontal lobe and remote lacunar infarcts in the cerebellum bilaterally - no clot on echo  - Continue Lipitor - Resumed Xarelto  - PT/OT recommending SNF; [facility] and [facility] both declined; [name] currently no beds  Physical deconditioning - PT/OT recommending SNF  Parkinson's disease (HCC) - mentation worse today; I do appreciate a component of underlying dementia in setting of his PD - Continue Sinemet  and modafinil  -Continue Seroquel  at 5 PM - With recurrent  hospitalizations and slow progressive decline, may need to also start considering more contribution from underlying Parkinson's and that goals of care may need to continue to be discussed as time goes forward  History of DVT (deep vein thrombosis) - Continue Xarelto   History of CVA (cerebrovascular accident) - per [facility]: "Remote infarct in the anterior left frontal lobe and remote lacunar infarcts in the cerebellum bilaterally." - continue Lipitor and Xarelto   Essential hypertension - On midodrine  at home with parameters to hold if SBP greater than 130  Paroxysmal atrial fibrillation (HCC) - Continue Xarelto  -Rate controlled  Chronic diastolic heart failure (HCC) - No signs/symptoms of exacerbation - Follow-up repeat echo -Last echo 01/06/2022 showed EF 60 to 65%, indeterminate diastolic parameters  Type 2 diabetes mellitus with hyperlipidemia (HCC) - A1c 6.4% - Continue SSI and CBG monitoring  HLD (hyperlipidemia) - LDL 55 - Continue Lipitor  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 If pt undergoes a cardiac device procedure during that time, they should be re-cleared.   Tachy-therapies to be programmed off if applicable with device back to pre-MRI settings after completion of exam. 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  nodes; restaging CT in 01/2024 showed stable disease  - Remains under observation    Old records reviewed in assessment of this patient  Antimicrobials:   DVT prophylaxis:  SCDs Start: 03/21/24 2102 rivaroxaban  (XARELTO ) tablet 20 mg   Code Status:   Code Status: Full Code  Mobility Assessment (Last 72 Hours)     Mobility Assessment     Row Name 03/24/24 0800 03/23/24 1950 03/23/24 1600 03/23/24 1200 03/23/24 1100   Does patient have an order for bedrest or is patient medically unstable No - Continue assessment No - Continue assessment No - Continue assessment No - Continue assessment --   What is the highest level of mobility based on the progressive mobility assessment? Level 2 (Chairfast) - Balance while sitting on edge of bed and cannot stand Level 2 (Chairfast) - Balance while sitting on edge of bed and cannot stand Level 2 (Chairfast) - Balance while sitting on edge of bed and cannot stand Level 2 (Chairfast) - Balance while sitting on edge of bed and cannot stand Level 3 (Stands with assist) - Balance while standing  and cannot march in place   Is the above level different from baseline mobility prior to current illness? Yes - Recommend PT order Yes - Recommend PT order Yes - Recommend PT order Yes - Recommend PT order --    Row Name 03/23/24 0816 03/22/24 2000 03/22/24 16:06:10 03/22/24 1433     Does patient have an order for bedrest or is patient medically unstable No - Continue assessment No - Continue assessment No - Continue assessment --    What is the highest level of mobility based on the progressive mobility assessment? Level 2 (Chairfast) - Balance while sitting on edge of bed and cannot stand Level 2 (Chairfast) - Balance while sitting on edge of bed and cannot stand Level 2 (Chairfast) - Balance while sitting on edge of bed and cannot stand Level 2 (Chairfast) - Balance while sitting on edge of bed and cannot stand    Is the above level different from baseline mobility  prior to current illness? Yes - Recommend PT order Yes - Recommend PT order Yes - Recommend PT order --             Barriers to discharge: none Disposition Plan:  TBD HH orders placed: TBD Status is: Inpt  Objective: Blood pressure 137/89, pulse 73, temperature 98.1 F (36.7 C), temperature source Oral, resp. rate 19, weight 94.6 kg, SpO2 92%.  Examination:  Physical Exam Constitutional:      General: He is not in acute distress.    Appearance: Normal appearance. He is obese.     Comments: Grossly deconditioned appearing  HENT:     Head: Normocephalic and atraumatic.     Mouth/Throat:     Mouth: Mucous membranes are moist.  Eyes:     Extraocular Movements: Extraocular movements intact.  Cardiovascular:     Rate and Rhythm: Normal rate and regular rhythm.  Pulmonary:     Effort: Pulmonary effort is normal. No respiratory distress.     Breath sounds: Normal breath sounds. No wheezing.  Abdominal:     General: Bowel sounds are normal. There is no distension.     Palpations: Abdomen is soft.  Tenderness: There is no abdominal tenderness.  Musculoskeletal:        General: Normal range of motion.     Cervical back: Normal range of motion and neck supple.  Skin:    General: Skin is warm and dry.  Neurological:     Mental Status: He is alert. He is confused.     Comments: No focal deficits.  Tremor appreciated in hands.  Slow mentation  Psychiatric:        Mood and Affect: Mood normal.      Consultants:  Neurology  Procedures:    Data Reviewed: Results for orders placed or performed during the hospital encounter of 03/21/24 (from the past 24 hours)  Glucose, capillary     Status: Abnormal   Collection Time: 03/23/24  4:10 PM  Result Value Ref Range   Glucose-Capillary 155 (H) 70 - 99 mg/dL   Comment 1 Notify RN    Comment 2 Document in Chart   Glucose, capillary     Status: Abnormal   Collection Time: 03/23/24  9:25 PM  Result Value Ref Range    Glucose-Capillary 250 (H) 70 - 99 mg/dL   Comment 1 Notify RN   Glucose, capillary     Status: Abnormal   Collection Time: 03/24/24  6:21 AM  Result Value Ref Range   Glucose-Capillary 186 (H) 70 - 99 mg/dL   Comment 1 Notify RN     I have reviewed pertinent nursing notes, vitals, labs, and images as necessary. I have ordered labwork to follow up on as indicated.  I have reviewed the last notes from staff over past 24 hours. I have discussed patient's care plan and test results with nursing staff, CM/SW, and other staff as appropriate.  Time spent: Greater than 50% of the 55 minute visit was spent in counseling/coordination of care for the patient as laid out in the A&P.   LOS: 3 days   Faith Homes, MD [facility] 03/24/2024, 3:00 PM

## 2024-03-25 DIAGNOSIS — G20A1 Parkinson's disease without dyskinesia, without mention of fluctuations: Secondary | ICD-10-CM | POA: Diagnosis not present

## 2024-03-25 DIAGNOSIS — R5381 Other malaise: Secondary | ICD-10-CM | POA: Diagnosis not present

## 2024-03-25 DIAGNOSIS — G459 Transient cerebral ischemic attack, unspecified: Secondary | ICD-10-CM | POA: Diagnosis not present

## 2024-03-25 LAB — GLUCOSE, CAPILLARY
Glucose-Capillary: 151 mg/dL — ABNORMAL HIGH (ref 70–99)
Glucose-Capillary: 185 mg/dL — ABNORMAL HIGH (ref 70–99)
Glucose-Capillary: 150 mg/dL — ABNORMAL HIGH (ref 70–99)
Glucose-Capillary: 141 mg/dL — ABNORMAL HIGH (ref 70–99)
Glucose-Capillary: 244 mg/dL — ABNORMAL HIGH (ref 70–99)

## 2024-03-25 MED ORDER — DONEPEZIL HCL 5 MG PO TABS
5.0000 mg | ORAL_TABLET | Freq: Every day | ORAL | Status: DC
  Administered 2024-03-25 – 2024-03-31 (×7): 5 mg via ORAL
  Filled 2024-03-25 (×7): qty 1

## 2024-03-25 MED ORDER — QUETIAPINE FUMARATE 25 MG PO TABS
25.0000 mg | ORAL_TABLET | Freq: Two times a day (BID) | ORAL | Status: DC
  Administered 2024-03-26: 25 mg via ORAL
  Filled 2024-03-25 (×2): qty 1

## 2024-03-25 MED ORDER — HALOPERIDOL LACTATE 5 MG/ML IJ SOLN
1.0000 mg | Freq: Four times a day (QID) | INTRAMUSCULAR | Status: DC | PRN
  Administered 2024-03-25 – 2024-03-28 (×2): 1 mg via INTRAMUSCULAR
  Filled 2024-03-25 (×2): qty 1

## 2024-03-25 NOTE — Progress Notes (Addendum)
 Had been agitated, restless, refused to be taken vital signs, wanting to get up on bed, removed his urinary external catheter, trying to re orient the patient but combative and very strong. Placed mitten on both hand and secured IV line with dressing. Informed MD Mansy, ordered prn meds and given as ordered. Still restless, re-informed MD, soft waist restraints were placed, spoke to wife and informed regarding pt behavior and the order for restraint, wife is aware.

## 2024-03-25 NOTE — Plan of Care (Signed)
  Problem: Education: Goal: Knowledge of disease or condition will improve Outcome: Progressing   Problem: Ischemic Stroke/TIA Tissue Perfusion: Goal: Complications of ischemic stroke/TIA will be minimized Outcome: Progressing   Problem: Coping: Goal: Will verbalize positive feelings about self Outcome: Progressing   Problem: Health Behavior/Discharge Planning: Goal: Ability to manage health-related needs will improve Outcome: Progressing   Problem: Self-Care: Goal: Ability to communicate needs accurately will improve Outcome: Progressing   Problem: Nutrition: Goal: Risk of aspiration will decrease Outcome: Progressing   Problem: Skin Integrity: Goal: Risk for impaired skin integrity will decrease Outcome: Progressing   Problem: Clinical Measurements: Goal: Will remain free from infection Outcome: Progressing   Problem: Safety: Goal: Ability to remain free from injury will improve Outcome: Progressing   Problem: Safety: Goal: Non-violent Restraint(s) Outcome: Progressing   Problem: Skin Integrity: Goal: Risk for impaired skin integrity will decrease Outcome: Progressing

## 2024-03-25 NOTE — Progress Notes (Signed)
 RN repositioned patient for medications and dinner. While raising HOB, pt began with persistent upward gaze. Pt blinked to threat but did not track with eyes. Pt speech slurred but he was able to follow commands and did respond appropriately verbally with short answers. Ataxia noted in bilateral upper extremities. Vitals and CBG checked - all within normal limits for patient. NIH = 7, was previously 1. Episode lasted approximately 10 minutes before pt went to sleep and started snorning. Pt also noted to have a brief episode of shaking shortly after drifting off to sleep, which has been witnessed before. Dr. Gladstone Lamer notified and rapid response nurse called for further assessment.

## 2024-03-25 NOTE — Plan of Care (Signed)

## 2024-03-25 NOTE — Progress Notes (Signed)
 Progress Note    Jordan [name]   ZOX:[id number]  DOB: December 12, 1940  DOA: 03/21/2024     4 PCP: Roselind Congo, MD  Initial CC: right side weakness, aphasia  Hospital Course: Jordan [name] is an 83 year old male with PMH CVA, DM II, chronic diastolic CHF, arthritis, Parkinson's disease, CAD, DVT, gout, HTN, HLD, OSA on CPAP, metastatic neuroendocrine tumor who presented with right sided weakness and aphasia. He was recently hospitalized 4/26 - 5/2 for encephalopathy that was felt to be due to medication side effect of Seroquel .  CT head and EEG were also unremarkable at that time. His Seroquel  was adjusted to be given at 5 PM which helped with hallucinations/agitation without oversedation.  This hospitalization he presented with aphasia and right side weakness.  Symptoms were slowly improving upon admission.  He was admitted for repeat stroke workup.  Interval History:  No events overnight.  Wife present bedside this morning.  Still hopeful for [name].  Patient resting comfortably with no complaints.  Assessment and Plan: * TIA (transient ischemic attack) - possible TIA given aphasia and right sided weakness that was slowly improving on admission; does not seem to have any obvious deficits on exam  - MRI brain also negative for acute stroke; CT head on admission shows remote infarct involving anterior left frontal lobe and remote lacunar infarcts in the cerebellum bilaterally - no clot on echo  - Continue Lipitor - Resumed Xarelto  - PT/OT recommending SNF; [facility] and [facility] both declined; [name] currently no beds  Physical deconditioning - PT/OT recommending SNF  Parkinson's disease (HCC) - I do appreciate a component of underlying dementia in setting of his PD - Continue Sinemet  and modafinil  - Seroquel  changed to twice daily given some increasing agitation -Also discussed with wife on 03/25/2024 and we will also start trial of Aricept - With recurrent hospitalizations  and slow progressive decline, may need to also start considering more contribution from underlying Parkinson's and that goals of care may need to continue to be discussed as time goes forward - She is also concerned about patient having hypnagogic jerks when falling asleep; stated I would discuss with neurology this coming week but I did not recommend any medication changes at this time  History of DVT (deep vein thrombosis) - Continue Xarelto   History of CVA (cerebrovascular accident) - per [facility]: "Remote infarct in the anterior left frontal lobe and remote lacunar infarcts in the cerebellum bilaterally." - continue Lipitor and Xarelto   Essential hypertension - On midodrine  at home with parameters to hold if SBP greater than 130  Paroxysmal atrial fibrillation (HCC) - Continue Xarelto  -Rate controlled  Chronic diastolic heart failure (HCC) - No signs/symptoms of exacerbation - Follow-up repeat echo -Last echo 01/06/2022 showed EF 60 to 65%, indeterminate diastolic parameters  Type 2 diabetes mellitus with hyperlipidemia (HCC) - A1c 6.4% - Continue SSI and CBG monitoring  HLD (hyperlipidemia) - LDL 55 - Continue Lipitor  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 If pt undergoes a cardiac device procedure during that time, they should be re-cleared.   Tachy-therapies to be programmed  off if applicable with device back to pre-MRI settings after completion of exam. 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 nodes; restaging CT in 01/2024 showed stable disease  - Remains under observation    Old records reviewed in assessment of this patient  Antimicrobials:   DVT prophylaxis:  SCDs Start: 03/21/24 2102 rivaroxaban  (XARELTO ) tablet 20 mg   Code Status:   Code Status: Full Code  Mobility Assessment (Last 72 Hours)     Mobility Assessment     Row Name 03/24/24 2000 03/24/24 0800 03/23/24 1950 03/23/24 1600 03/23/24 1200   Does patient have an order for bedrest or is patient medically unstable No - Continue assessment No - Continue assessment No - Continue assessment No - Continue assessment No - Continue assessment   What is the highest level of mobility based on the progressive mobility assessment? Level 2 (Chairfast) - Balance while sitting on edge of bed and cannot stand Level 2 (Chairfast) - Balance while sitting on edge of bed and cannot stand Level 2 (Chairfast) - Balance while sitting on edge of bed and cannot stand Level 2 (Chairfast) - Balance while sitting on edge of bed and cannot stand Level 2 (Chairfast) - Balance while sitting on edge of bed and cannot stand   Is the above level different from baseline mobility prior to current illness? Yes - Recommend PT order Yes - Recommend PT order Yes - Recommend PT order Yes - Recommend PT order Yes - Recommend PT order    Row Name 03/23/24 1100 03/23/24 0816 03/22/24 2000 03/22/24 16:06:10     Does patient have an order for bedrest or is patient medically unstable -- No - Continue assessment No - Continue assessment No - Continue assessment    What is the highest level of mobility based on the progressive mobility assessment? Level 3 (Stands with assist) - Balance while standing  and cannot march in place Level 2 (Chairfast) - Balance while sitting on edge  of bed and cannot stand Level 2 (Chairfast) - Balance while sitting on edge of bed and cannot stand Level 2 (Chairfast) - Balance while sitting on edge of bed and cannot stand    Is the above level different from baseline mobility prior to current illness? -- Yes - Recommend PT order Yes - Recommend PT order Yes - Recommend PT order             Barriers to discharge: none Disposition Plan:  TBD HH orders placed: TBD Status is: Inpt  Objective: Blood pressure (!) 164/98, pulse 78, temperature 98.6 F (37 C), temperature source Oral, resp. rate 19, weight 94.6 kg, SpO2 94%.  Examination:  Physical Exam Constitutional:      General: He is not in acute distress.    Appearance: Normal appearance. He is obese.     Comments: Grossly deconditioned appearing  HENT:     Head: Normocephalic and atraumatic.     Mouth/Throat:     Mouth: Mucous membranes are moist.  Eyes:     Extraocular Movements: Extraocular movements intact.  Cardiovascular:     Rate and Rhythm: Normal rate and regular rhythm.  Pulmonary:     Effort: Pulmonary effort is normal. No respiratory distress.  Breath sounds: Normal breath sounds. No wheezing.  Abdominal:     General: Bowel sounds are normal. There is no distension.     Palpations: Abdomen is soft.     Tenderness: There is no abdominal tenderness.  Musculoskeletal:        General: Normal range of motion.     Cervical back: Normal range of motion and neck supple.  Skin:    General: Skin is warm and dry.  Neurological:     Mental Status: He is alert. He is confused.     Comments: No focal deficits.  Minimal now tremor appreciated in hands.  Slow mentation  Psychiatric:        Mood and Affect: Mood normal.      Consultants:  Neurology  Procedures:    Data Reviewed: Results for orders placed or performed during the hospital encounter of 03/21/24 (from the past 24 hours)  Glucose, capillary     Status: Abnormal   Collection Time: 03/24/24  4:19  PM  Result Value Ref Range   Glucose-Capillary 200 (H) 70 - 99 mg/dL  Glucose, capillary     Status: Abnormal   Collection Time: 03/24/24  9:59 PM  Result Value Ref Range   Glucose-Capillary 209 (H) 70 - 99 mg/dL   Comment 1 Notify RN   Glucose, capillary     Status: Abnormal   Collection Time: 03/25/24  6:13 AM  Result Value Ref Range   Glucose-Capillary 151 (H) 70 - 99 mg/dL   Comment 1 Notify RN   Glucose, capillary     Status: Abnormal   Collection Time: 03/25/24 11:15 AM  Result Value Ref Range   Glucose-Capillary 185 (H) 70 - 99 mg/dL    I have reviewed pertinent nursing notes, vitals, labs, and images as necessary. I have ordered labwork to follow up on as indicated.  I have reviewed the last notes from staff over past 24 hours. I have discussed patient's care plan and test results with nursing staff, CM/SW, and other staff as appropriate.    LOS: 4 days   Faith Homes, MD [facility] 03/25/2024, 3:40 PM

## 2024-03-25 NOTE — Significant Event (Signed)
 Rapid Response Event Note   Reason for Call :  Upward gaze  Initial Focused Assessment:  Pt lying in bed, alert. Oriented to age, disoriented to month. Follows commands. EOMI, visual fields intact. Face symmetrical. Grips equal. Plantar flexion equal. Clear speech.   Pt falls asleep when not stimulated. He initially falls asleep with his eyes opens, but will close them and begins snoring. He is easily aroused after falling asleep.   Pt on Seroquel  25mg  BID. Received IM Haldol  at 0048 today.   VS: BP 141/81, HR 70, RR 14, SpO2 93% on room air CBG: 141  Interventions:  No intervention   Plan of Care:  -Continue current treatment plan  Event Summary:  MD Notified: per primary RN Call Time: 1737 Arrival Time: 1800 End Time: 1825  Washington Hacker, RN

## 2024-03-25 NOTE — Progress Notes (Signed)
 RT NOTE: PT has home CPAP at bedside and does not require assistance. RT told PT to call if needs assistance when ready to go on CPAP for the night.

## 2024-03-26 DIAGNOSIS — R5381 Other malaise: Secondary | ICD-10-CM | POA: Diagnosis not present

## 2024-03-26 DIAGNOSIS — G20A1 Parkinson's disease without dyskinesia, without mention of fluctuations: Secondary | ICD-10-CM | POA: Diagnosis not present

## 2024-03-26 DIAGNOSIS — G459 Transient cerebral ischemic attack, unspecified: Secondary | ICD-10-CM | POA: Diagnosis not present

## 2024-03-26 LAB — GLUCOSE, CAPILLARY
Glucose-Capillary: 154 mg/dL — ABNORMAL HIGH (ref 70–99)
Glucose-Capillary: 269 mg/dL — ABNORMAL HIGH (ref 70–99)
Glucose-Capillary: 183 mg/dL — ABNORMAL HIGH (ref 70–99)
Glucose-Capillary: 152 mg/dL — ABNORMAL HIGH (ref 70–99)

## 2024-03-26 MED ORDER — QUETIAPINE FUMARATE 25 MG PO TABS
25.0000 mg | ORAL_TABLET | Freq: Every evening | ORAL | Status: DC
Start: 2024-03-26 — End: 2024-03-31
  Administered 2024-03-26 – 2024-03-30 (×5): 25 mg via ORAL
  Filled 2024-03-26 (×7): qty 1

## 2024-03-26 NOTE — Progress Notes (Signed)
 Progress Note    Jordan [name]   ZOX:[id number]  DOB: 04-May-1941  DOA: 03/21/2024     5 PCP: Jordan Congo, MD  Initial CC: right side weakness, aphasia  Hospital Course: [name] is an 83 year old male with PMH CVA, DM II, chronic diastolic CHF, arthritis, Parkinson's disease, CAD, DVT, gout, HTN, HLD, OSA on CPAP, metastatic neuroendocrine tumor who presented with right sided weakness and aphasia. He was recently hospitalized 4/26 - 5/2 for encephalopathy that was felt to be due to medication side effect of Seroquel .  CT head and EEG were also unremarkable at that time. His Seroquel  was adjusted to be given at 5 PM which helped with hallucinations/agitation without oversedation.  This hospitalization he presented with aphasia and right side weakness.  Symptoms were slowly improving upon admission.  He was admitted for repeat stroke workup.  Interval History:  Yesterday late afternoon he had episode of fixed upward gaze.  I called and discussed case with neurology during that time as well.  Rapid had also evaluated patient. 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 Seroquel  decreased back to daily dosing only. Updated his wife at bedside this morning on further plan. At this time, still awaiting potential discharge to [name].  Assessment and Plan: * TIA (transient ischemic attack)-resolved as of 03/25/2024 - possible TIA given aphasia and right sided weakness that was slowly improving on admission; does not seem to have any obvious deficits on exam  - MRI brain also negative for acute stroke; CT head on admission shows remote infarct involving anterior left frontal lobe and remote lacunar infarcts in the cerebellum bilaterally - no clot on echo  - Continue Lipitor - Resumed Xarelto  - PT/OT recommending SNF; [facility] both declined;  [name] currently no beds  Physical deconditioning - PT/OT recommending SNF  Parkinson's disease (HCC) - I do appreciate a component of underlying dementia in setting of his PD - Continue Sinemet  and modafinil  - Due to oversedation, Seroquel  changed back to daily only around 5 PM; may have also been causing some dystonia/oculogyric crisis with twice daily dosing -Also discussed with wife on 03/25/2024 and we will also start trial of Aricept - With recurrent hospitalizations and slow progressive decline, may need to also start considering more contribution from underlying Parkinson's and that goals of care may need to continue to be discussed as time goes forward  History of DVT (deep vein thrombosis) - Continue Xarelto   History of CVA (cerebrovascular accident) - per [facility]: "Remote infarct in the anterior left frontal lobe and remote lacunar infarcts in the cerebellum bilaterally." - continue Lipitor and Xarelto   Essential hypertension - On midodrine  at home with parameters to hold if SBP greater than 130  Paroxysmal atrial fibrillation (HCC) - Continue Xarelto  -Rate controlled  Chronic diastolic heart failure (HCC) - No signs/symptoms of exacerbation - Follow-up repeat echo -Last echo 01/06/2022 showed EF 60 to 65%, indeterminate diastolic parameters  Type 2 diabetes mellitus with hyperlipidemia (HCC) - A1c 6.4% - Continue SSI and CBG monitoring  HLD (hyperlipidemia) - LDL 55 - Continue Lipitor  Presence of permanent cardiac pacemaker - Due to history of sinus node dysfunction -Radiology note copied below for future reference "Device system confirmed to be MRI conditional, with implant date > 6 weeks ago, and no evidence of abandoned or epicardial leads in review of most recent CXR  Device last cleared by EP  Provider: Michaelle [name] 03/21/24  Clearance is good through for 1 year as long as parameters remain stable at time of check. If pt undergoes a cardiac device procedure  during that time, they should be re-cleared.   Tachy-therapies to be programmed off if applicable with device back to pre-MRI settings after completion of exam. 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; restaging CT in 01/2024 showed stable disease  - Remains under observation    Old records reviewed in assessment of this patient  Antimicrobials:   DVT prophylaxis:  SCDs Start: 03/21/24 2102 rivaroxaban  (XARELTO ) tablet 20 mg   Code Status:   Code Status: Full Code  Mobility Assessment (Last 72 Hours)     Mobility Assessment     Row Name 03/26/24 1247 03/26/24 1000 03/26/24 0800 03/25/24 2000 03/25/24 1600   Does patient have an order for bedrest or is patient medically unstable No - Continue assessment No - Continue assessment No - Continue assessment No - Continue assessment No - Continue assessment   What is the highest level of mobility based on the progressive mobility assessment? Level 2 (Chairfast) - Balance while sitting on edge of bed and cannot stand Level 2 (Chairfast) - Balance while sitting on edge of bed and cannot stand Level 2 (Chairfast) - Balance while sitting on edge of bed and cannot stand Level 2 (Chairfast) - Balance while sitting on edge of bed and cannot stand Level 2 (Chairfast) - Balance while sitting on edge of bed and cannot stand   Is the above level different from baseline mobility prior to current illness? -- -- -- Yes - Recommend PT order Yes - Recommend PT order    Row Name 03/25/24 1200 03/25/24 0800 03/24/24 2000 03/24/24 0800 03/23/24 1950   Does patient have an order for bedrest or is patient medically unstable No - Continue assessment No - Continue assessment No - Continue  assessment No - Continue assessment No - Continue assessment   What is the highest level of mobility based on the progressive mobility assessment? Level 2 (Chairfast) - Balance while sitting on edge of bed and cannot stand Level 2 (Chairfast) - Balance while sitting on edge of bed and cannot stand Level 2 (Chairfast) - Balance while sitting on edge of bed and cannot stand Level 2 (Chairfast) - Balance while sitting on edge of bed and cannot stand Level 2 (Chairfast) - Balance while sitting on edge of bed and cannot stand   Is the above level different from baseline mobility prior to current illness? Yes - Recommend PT order Yes - Recommend PT order Yes - Recommend PT order Yes - Recommend PT order Yes - Recommend PT order    Row Name 03/23/24 1600           Does patient have an order for bedrest or is patient medically unstable No - Continue assessment       What is the highest level of mobility based on the progressive mobility assessment? Level 2 (Chairfast) - Balance while sitting on edge of bed and cannot stand       Is the above level different from baseline mobility prior to current illness? Yes - Recommend PT order  Barriers to discharge: none Disposition Plan:  TBD HH orders placed: TBD Status is: Inpt  Objective: Blood pressure (!) 143/90, pulse 70, temperature 97.8 F (36.6 C), temperature source Oral, resp. rate 16, weight 94.6 kg, SpO2 93%.  Examination:  Physical Exam Constitutional:      General: He is not in acute distress.    Appearance: Normal appearance. He is obese.     Comments: Grossly deconditioned appearing  HENT:     Head: Normocephalic and atraumatic.     Mouth/Throat:     Mouth: Mucous membranes are moist.  Eyes:     Extraocular Movements: Extraocular movements intact.  Cardiovascular:     Rate and Rhythm: Normal rate and regular rhythm.  Pulmonary:     Effort: Pulmonary effort is normal. No respiratory distress.     Breath sounds:  Normal breath sounds. No wheezing.  Abdominal:     General: Bowel sounds are normal. There is no distension.     Palpations: Abdomen is soft.     Tenderness: There is no abdominal tenderness.  Musculoskeletal:        General: Normal range of motion.     Cervical back: Normal range of motion and neck supple.  Skin:    General: Skin is warm and dry.  Neurological:     Mental Status: He is alert. He is confused.     Comments: No focal deficits.  Minimal tremor appreciated in hands intermittently.  Slow mentation. Oriented to name and year.   Psychiatric:        Mood and Affect: Mood normal.      Consultants:  Neurology  Procedures:    Data Reviewed: Results for orders placed or performed during the hospital encounter of 03/21/24 (from the past 24 hours)  Glucose, capillary     Status: Abnormal   Collection Time: 03/25/24  4:08 PM  Result Value Ref Range   Glucose-Capillary 150 (H) 70 - 99 mg/dL  Glucose, capillary     Status: Abnormal   Collection Time: 03/25/24  5:41 PM  Result Value Ref Range   Glucose-Capillary 141 (H) 70 - 99 mg/dL  Glucose, capillary     Status: Abnormal   Collection Time: 03/25/24  9:15 PM  Result Value Ref Range   Glucose-Capillary 244 (H) 70 - 99 mg/dL   Comment 1 Notify RN   Glucose, capillary     Status: Abnormal   Collection Time: 03/26/24  6:24 AM  Result Value Ref Range   Glucose-Capillary 154 (H) 70 - 99 mg/dL   Comment 1 Notify RN    Comment 2 Document in Chart   Glucose, capillary     Status: Abnormal   Collection Time: 03/26/24 11:58 AM  Result Value Ref Range   Glucose-Capillary 269 (H) 70 - 99 mg/dL    I have reviewed pertinent nursing notes, vitals, labs, and images as necessary. I have ordered labwork to follow up on as indicated.  I have reviewed the last notes from staff over past 24 hours. I have discussed patient's care plan and test results with nursing staff, CM/SW, and other staff as appropriate.  Time spent: Greater  than 50% of the 55 minute visit was spent in counseling/coordination of care for the patient as laid out in the A&P.    LOS: 5 days   Faith Homes, MD [facility] 03/26/2024, 1:41 PM

## 2024-03-26 NOTE — Plan of Care (Signed)
  Problem: Education: Goal: Knowledge of disease or condition will improve Outcome: Not Progressing   

## 2024-03-26 NOTE — Progress Notes (Signed)
 Another lunch tray ordered for 2PM at this time per pt's wife request.

## 2024-03-26 NOTE — Progress Notes (Signed)
 Physical Therapy Treatment Patient Details Name: Jordan [name] MRN: [id number] DOB: January 07, 1941 Today's Date: 03/26/2024   History of Present Illness Pt is 83 yo presenting to [facility] ED on 5/21 due to R sided weakness and aphasia; MRI brain also negative for acute stroke; recent hospitalization.   PMHx: Parkinsons, HLD, OSA, DM, HTN, chronic diastolic HF, CVA, frequent falls, L2 compression fx, L hip fx, metastatic neuroendocrine tumor.    PT Comments  Patient with limited progress this session.  Seems to get symptomatic either back pain or weakness (likely associated with BP drop) in standing and unable to step to recliner.  Patient with BP 86/59 after return to supine from standing attempts x 2.  Wife present and supportive.  Feel pt will benefit from post-acute inpatient rehab (<3 hours/day) prior to d/c home.     If plan is discharge home, recommend the following: Two people to help with walking and/or transfers;Assistance with cooking/housework;Assist for transportation;Supervision due to cognitive status   Can travel by private vehicle     No  Equipment Recommendations  None recommended by PT    Recommendations for Other Services       Precautions / Restrictions Precautions Precautions: Fall Recall of Precautions/Restrictions: Impaired Precaution/Restrictions Comments: watch BP     Mobility  Bed Mobility Overal bed mobility: Needs Assistance Bed Mobility: Supine to Sit, Sit to Supine     Supine to sit: Mod assist, HOB elevated, Max assist Sit to supine: Max assist   General bed mobility comments: help to initiate moving legs to EOB, lifting help for trunk and scooting hips to EOB    Transfers Overall transfer level: Needs assistance Equipment used: Rolling walker (2 wheels) Transfers: Sit to/from Stand, Bed to chair/wheelchair/BSC Sit to Stand: Mod assist Stand pivot transfers: Max assist         General transfer comment: stood x 2 with flexed posture to RW, took  two steps then sat on EOB with max A for safety to prevent falling with knees buckling; assisted to supine and scooting up in bed    Ambulation/Gait                   Stairs             Wheelchair Mobility     Tilt Bed    Modified Rankin (Stroke Patients Only)       Balance Overall balance assessment: Needs assistance Sitting-balance support: Feet supported Sitting balance-Leahy Scale: Fair Sitting balance - Comments: on EOB unsupported, then reports increased back pain   Standing balance support: Bilateral upper extremity supported Standing balance-Leahy Scale: Poor Standing balance comment: UE support with RW and mod A for balance though degraded needing max A due to knee buckling                            Communication Communication Communication: Impaired Factors Affecting Communication: Hearing impaired;Difficulty expressing self  Cognition Arousal: Alert Behavior During Therapy: Flat affect   PT - Cognitive impairments: Memory, Attention, Sequencing, Problem solving, Safety/Judgement, Awareness, Initiation                       PT - Cognition Comments: slow to initiate with A to move legs to EOB, increased time for processing Following commands: Impaired Following commands impaired: Follows one step commands with increased time, Follows one step commands inconsistently    Cueing Cueing Techniques: Verbal cues, Gestural cues,  Tactile cues  Exercises      General Comments General comments (skin integrity, edema, etc.): SpO2 and HR WNL Though BP After return to supine was 83/59; RN Aware      Pertinent Vitals/Pain Pain Assessment Pain Assessment: Faces Faces Pain Scale: Hurts little more Pain Location: back in sitting Pain Descriptors / Indicators: Discomfort, Grimacing Pain Intervention(s): Repositioned, Monitored during session, Limited activity within patient's tolerance    Home Living                           Prior Function            PT Goals (current goals can now be found in the care plan section) Progress towards PT goals: Progressing toward goals    Frequency    Min 2X/week      PT Plan      Co-evaluation              AM-PAC PT "6 Clicks" Mobility   Outcome Measure  Help needed turning from your back to your side while in a flat bed without using bedrails?: A Lot Help needed moving from lying on your back to sitting on the side of a flat bed without using bedrails?: A Lot Help needed moving to and from a bed to a chair (including a wheelchair)?: Total Help needed standing up from a chair using your arms (e.g., wheelchair or bedside chair)?: A Lot Help needed to walk in hospital room?: Total Help needed climbing 3-5 steps with a railing? : Total 6 Click Score: 9    End of Session Equipment Utilized During Treatment: Gait belt Activity Tolerance: Treatment limited secondary to medical complications (Comment) (drop in BP likely and unable to step to recliner)     PT Visit Diagnosis: Other abnormalities of gait and mobility (R26.89);Difficulty in walking, not elsewhere classified (R26.2);Other symptoms and signs involving the nervous system (R29.898)     Time: 1610-9604 PT Time Calculation (min) (ACUTE ONLY): 36 min  Charges:    $Therapeutic Activity: 23-37 mins PT General Charges $$ ACUTE PT VISIT: 1 Visit                     Abigail Hoff, PT Acute Rehabilitation Services [phone] 03/26/2024    Jordan [name] 03/26/2024, 5:24 PM

## 2024-03-26 NOTE — Plan of Care (Signed)
 Maintained on soft waist restraints. Had been asleep and calm most of the night but agitated at times and removed gown and tele monitoring. No other significant changes happens as of this time. Problem: Education: Goal: Knowledge of disease or condition will improve Outcome: Progressing   Problem: Ischemic Stroke/TIA Tissue Perfusion: Goal: Complications of ischemic stroke/TIA will be minimized Outcome: Progressing   Problem: Coping: Goal: Will verbalize positive feelings about self Outcome: Progressing   Problem: Health Behavior/Discharge Planning: Goal: Ability to manage health-related needs will improve Outcome: Progressing   Problem: Self-Care: Goal: Ability to participate in self-care as condition permits will improve Outcome: Progressing   Problem: Self-Care: Goal: Ability to communicate needs accurately will improve Outcome: Progressing   Problem: Nutrition: Goal: Risk of aspiration will decrease Outcome: Progressing   Problem: Skin Integrity: Goal: Risk for impaired skin integrity will decrease Outcome: Progressing   Problem: Clinical Measurements: Goal: Will remain free from infection Outcome: Progressing   Problem: Activity: Goal: Risk for activity intolerance will decrease Outcome: Progressing   Problem: Safety: Goal: Ability to remain free from injury will improve Outcome: Progressing   Problem: Safety: Goal: Non-violent Restraint(s) Outcome: Progressing

## 2024-03-26 NOTE — Progress Notes (Addendum)
 [name] spouse at the bedside expressed concerns of him "being chemically  (with Seroquel ) and physically restraints due to his mentation" and requests to have his soft belt restraints removed. Patient is currently resting quietly in bed. Per spouse at bedside to please call her if patient is getting aggressive or agitated as she only lives a few min away and can return to hospital if needed. She shares that she would like to be involve and help in his care as much as she can. Nursing POC reviewed with her that we will continue to frequently rounds and make sure his needs are met and will call her at home per her requests. RN notified MD to speak with Jordan [name] in regards to his medications.

## 2024-03-27 DIAGNOSIS — G459 Transient cerebral ischemic attack, unspecified: Secondary | ICD-10-CM | POA: Diagnosis not present

## 2024-03-27 DIAGNOSIS — G20A1 Parkinson's disease without dyskinesia, without mention of fluctuations: Secondary | ICD-10-CM | POA: Diagnosis not present

## 2024-03-27 DIAGNOSIS — R5381 Other malaise: Secondary | ICD-10-CM | POA: Diagnosis not present

## 2024-03-27 LAB — GLUCOSE, CAPILLARY
Glucose-Capillary: 146 mg/dL — ABNORMAL HIGH (ref 70–99)
Glucose-Capillary: 157 mg/dL — ABNORMAL HIGH (ref 70–99)
Glucose-Capillary: 159 mg/dL — ABNORMAL HIGH (ref 70–99)
Glucose-Capillary: 188 mg/dL — ABNORMAL HIGH (ref 70–99)

## 2024-03-27 NOTE — Progress Notes (Signed)
 Progress Note    Jordan [name]   ZOX:[id number]  DOB: Sep 15, 1941  DOA: 03/21/2024     6 PCP: Roselind Congo, MD  Initial CC: right side weakness, aphasia  Hospital Course: Jordan [name] is an 83 year old male with PMH CVA, DM II, chronic diastolic CHF, arthritis, Parkinson's disease, CAD, DVT, gout, HTN, HLD, OSA on CPAP, metastatic neuroendocrine tumor who presented with right sided weakness and aphasia. He was recently hospitalized 4/26 - 5/2 for encephalopathy that was felt to be due to medication side effect of Seroquel .  CT head and EEG were also unremarkable at that time. His Seroquel  was adjusted to be given at 5 PM which helped with hallucinations/agitation without oversedation.  This hospitalization he presented with aphasia and right side weakness.  Symptoms were slowly improving upon admission.  He was admitted for repeat stroke workup.  Interval History:  More awake and alert today.  Much less sedated.  Doing better on daily Seroquel  instead. Awaiting bed availability at [name].   Assessment and Plan: * TIA (transient ischemic attack)-resolved as of 03/25/2024 - possible TIA given aphasia and right sided weakness that was slowly improving on admission; does not seem to have any obvious deficits on exam  - MRI brain also negative for acute stroke; CT head on admission shows remote infarct involving anterior left frontal lobe and remote lacunar infarcts in the cerebellum bilaterally - no clot on echo  - Continue Lipitor - Resumed Xarelto  - PT/OT recommending SNF; [facility] and [facility] both declined; [name] currently no beds  Physical deconditioning - PT/OT recommending SNF  Parkinson's disease (HCC) - I do appreciate a component of underlying dementia in setting of his PD - Continue Sinemet  and modafinil  - Due to oversedation, Seroquel  changed back to daily only around 5 PM; may have also been causing some dystonia/oculogyric crisis with twice daily dosing -Also  discussed with wife on 03/25/2024 and we will also start trial of Aricept - With recurrent hospitalizations and slow progressive decline, may need to also start considering more contribution from underlying Parkinson's and that goals of care may need to continue to be discussed as time goes forward  History of DVT (deep vein thrombosis) - Continue Xarelto   History of CVA (cerebrovascular accident) - per Jordan [name]: "Remote infarct in the anterior left frontal lobe and remote lacunar infarcts in the cerebellum bilaterally." - continue Lipitor and Xarelto   Essential hypertension - On midodrine  at home with parameters to hold if SBP greater than 130  Paroxysmal atrial fibrillation (HCC) - Continue Xarelto  -Rate controlled  Chronic diastolic heart failure (HCC) - No signs/symptoms of exacerbation - Follow-up repeat echo -Last echo 01/06/2022 showed EF 60 to 65%, indeterminate diastolic parameters  Type 2 diabetes mellitus with hyperlipidemia (HCC) - A1c 6.4% - Continue SSI and CBG monitoring  HLD (hyperlipidemia) - LDL 55 - Continue Lipitor  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 If pt undergoes a cardiac device procedure during that time, they should be re-cleared.   Tachy-therapies to be programmed off if applicable with device back to pre-MRI settings after completion of exam. 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; restaging CT in 01/2024 showed stable disease  - Remains under observation    Old records reviewed in assessment of this patient  Antimicrobials:   DVT prophylaxis:  SCDs Start: 03/21/24 2102 rivaroxaban  (XARELTO ) tablet 20 mg   Code Status:   Code Status: Full Code  Mobility Assessment (Last 72 Hours)     Mobility Assessment     Row Name 03/27/24 0800 03/26/24 2012 03/26/24 1721 03/26/24 1600 03/26/24 1247   Does patient have an order for bedrest or is patient medically unstable No - Continue assessment No - Continue assessment -- No - Continue assessment No - Continue assessment   What is the highest level of mobility based on the progressive mobility assessment? Level 3 (Stands with assist) - Balance while standing  and cannot march in place Level 3 (Stands with assist) - Balance while standing  and cannot march in place Level 3 (Stands with assist) - Balance while standing  and cannot march in place Level 2 (Chairfast) - Balance while sitting on edge of bed and cannot stand Level 2 (Chairfast) - Balance while sitting on edge of bed and cannot stand   Is the above level different from baseline mobility prior to current illness? Yes - Recommend PT order Yes - Recommend PT order -- -- --    Row Name 03/26/24 1000 03/26/24 0800 03/25/24 2000 03/25/24 1600 03/25/24 1200   Does patient have an order for bedrest or is patient medically unstable No - Continue assessment No - Continue assessment No - Continue assessment No - Continue assessment No - Continue assessment   What is the highest level of mobility based on the progressive mobility assessment? Level 2 (Chairfast) - Balance while sitting on edge of bed and cannot stand Level 2 (Chairfast) - Balance while sitting on edge of bed and cannot stand Level 2 (Chairfast) - Balance while sitting on edge of bed and cannot stand Level 2 (Chairfast) -  Balance while sitting on edge of bed and cannot stand Level 2 (Chairfast) - Balance while sitting on edge of bed and cannot stand   Is the above level different from baseline mobility prior to current illness? -- -- Yes - Recommend PT order Yes - Recommend PT order Yes - Recommend PT order    Row Name 03/25/24 0800 03/24/24 2000         Does patient have an order for bedrest or is patient medically unstable No - Continue assessment No - Continue assessment      What is the highest level of mobility based on the progressive mobility assessment? Level 2 (Chairfast) - Balance while sitting on edge of bed and cannot stand Level 2 (Chairfast) - Balance while sitting on edge of bed and cannot stand      Is the above level different from baseline mobility prior to current illness? Yes - Recommend PT order Yes - Recommend PT order               Barriers to discharge: none Disposition Plan:  TBD HH orders placed: TBD Status is: Inpt  Objective: Blood pressure 129/67, pulse 83, temperature 98.6 F (37 C), temperature source Oral, resp. rate 16, weight 94.6 kg, SpO2  100%.  Examination:  Physical Exam Constitutional:      General: He is not in acute distress.    Appearance: Normal appearance. He is obese.     Comments: Grossly deconditioned appearing  HENT:     Head: Normocephalic and atraumatic.     Mouth/Throat:     Mouth: Mucous membranes are moist.  Eyes:     Extraocular Movements: Extraocular movements intact.  Cardiovascular:     Rate and Rhythm: Normal rate and regular rhythm.  Pulmonary:     Effort: Pulmonary effort is normal. No respiratory distress.     Breath sounds: Normal breath sounds. No wheezing.  Abdominal:     General: Bowel sounds are normal. There is no distension.     Palpations: Abdomen is soft.     Tenderness: There is no abdominal tenderness.  Musculoskeletal:        General: Normal range of motion.     Cervical back: Normal range of motion and neck supple.   Skin:    General: Skin is warm and dry.  Neurological:     Mental Status: He is alert. He is confused.     Comments: No focal deficits.  Minimal tremor appreciated in hands intermittently.  Slow mentation. Oriented to name and year.   Psychiatric:        Mood and Affect: Mood normal.      Consultants:  Neurology  Procedures:    Data Reviewed: Results for orders placed or performed during the hospital encounter of 03/21/24 (from the past 24 hours)  Glucose, capillary     Status: Abnormal   Collection Time: 03/26/24  4:34 PM  Result Value Ref Range   Glucose-Capillary 183 (H) 70 - 99 mg/dL  Glucose, capillary     Status: Abnormal   Collection Time: 03/26/24  9:23 PM  Result Value Ref Range   Glucose-Capillary 152 (H) 70 - 99 mg/dL   Comment 1 Notify RN   Glucose, capillary     Status: Abnormal   Collection Time: 03/27/24  6:31 AM  Result Value Ref Range   Glucose-Capillary 146 (H) 70 - 99 mg/dL   Comment 1 Notify RN   Glucose, capillary     Status: Abnormal   Collection Time: 03/27/24 12:11 PM  Result Value Ref Range   Glucose-Capillary 157 (H) 70 - 99 mg/dL   Comment 1 Notify RN    Comment 2 Document in Chart     I have reviewed pertinent nursing notes, vitals, labs, and images as necessary. I have ordered labwork to follow up on as indicated.  I have reviewed the last notes from staff over past 24 hours. I have discussed patient's care plan and test results with nursing staff, CM/SW, and other staff as appropriate.      LOS: 6 days   Faith Homes, MD [facility] 03/27/2024, 1:36 PM

## 2024-03-27 NOTE — Plan of Care (Signed)
  Problem: Education: Goal: Knowledge of disease or condition will improve Outcome: Progressing   Problem: Safety: Goal: Ability to remain free from injury will improve Outcome: Progressing   Problem: Skin Integrity: Goal: Risk for impaired skin integrity will decrease Outcome: Progressing   Problem: Safety: Goal: Non-violent Restraint(s) Outcome: Progressing

## 2024-03-27 NOTE — Plan of Care (Signed)
  Problem: Coping: Goal: Will identify appropriate support needs Outcome: Progressing   Problem: Health Behavior/Discharge Planning: Goal: Goals will be collaboratively established with patient/family Outcome: Progressing   Problem: Self-Care: Goal: Ability to participate in self-care as condition permits will improve Outcome: Progressing Goal: Ability to communicate needs accurately will improve Outcome: Progressing   Problem: Nutrition: Goal: Risk of aspiration will decrease Outcome: Progressing Goal: Dietary intake will improve Outcome: Progressing   Problem: Metabolic: Goal: Ability to maintain appropriate glucose levels will improve Outcome: Progressing   Problem: Nutritional: Goal: Maintenance of adequate nutrition will improve Outcome: Progressing   Problem: Skin Integrity: Goal: Risk for impaired skin integrity will decrease Outcome: Progressing   Problem: Clinical Measurements: Goal: Respiratory complications will improve Outcome: Progressing Goal: Cardiovascular complication will be avoided Outcome: Progressing   Problem: Nutrition: Goal: Adequate nutrition will be maintained Outcome: Progressing   Problem: Coping: Goal: Level of anxiety will decrease Outcome: Progressing   Problem: Safety: Goal: Ability to remain free from injury will improve Outcome: Progressing

## 2024-03-27 NOTE — TOC Progression Note (Signed)
 Transition of Care [facility]) - Progression Note    Patient Details  Name: TYLAR AMBORN MRN: [id number] Date of Birth: 1941/05/07  Transition of [facility] CM/SW Contact  Jonathan Neighbor, RN Phone Number: 03/27/2024, 12:05 PM  Clinical Narrative:     [name] says they will have some beds this week. They are not in network with pts [facility] but will take him under his [place]. CM has faxed the required information to [facility] for approval. Should be reviewed tomorrow and they will update CM on if its approved.  TOC following.  Expected Discharge Plan: Home w Home Health Services Barriers to Discharge: Continued Medical Work up  Expected Discharge Plan and Services   Discharge Planning Services: CM Consult Post Acute Care Choice: IP Rehab Living arrangements for the past 2 months: Single Family Home                                       Social Determinants of Health (SDOH) Interventions SDOH Screenings   Food Insecurity: No Food Insecurity (03/22/2024)  Housing: Low Risk  (03/22/2024)  Transportation Needs: No Transportation Needs (03/22/2024)  Utilities: Not At Risk (03/22/2024)  Depression (PHQ2-9): Low Risk  (04/12/2023)  Social Connections: Moderately Integrated (03/22/2024)  Tobacco Use: Medium Risk (03/21/2024)    Readmission Risk Interventions     No data to display

## 2024-03-28 ENCOUNTER — Inpatient Hospital Stay (HOSPITAL_COMMUNITY): Payer: Medicare (Managed Care)

## 2024-03-28 ENCOUNTER — Inpatient Hospital Stay (HOSPITAL_COMMUNITY)

## 2024-03-28 ENCOUNTER — Encounter: Payer: Self-pay | Admitting: Adult Health

## 2024-03-28 DIAGNOSIS — G459 Transient cerebral ischemic attack, unspecified: Secondary | ICD-10-CM | POA: Diagnosis not present

## 2024-03-28 DIAGNOSIS — Z794 Long term (current) use of insulin: Secondary | ICD-10-CM

## 2024-03-28 DIAGNOSIS — R4182 Altered mental status, unspecified: Secondary | ICD-10-CM

## 2024-03-28 DIAGNOSIS — R569 Unspecified convulsions: Secondary | ICD-10-CM | POA: Diagnosis not present

## 2024-03-28 DIAGNOSIS — G20A1 Parkinson's disease without dyskinesia, without mention of fluctuations: Secondary | ICD-10-CM | POA: Diagnosis not present

## 2024-03-28 DIAGNOSIS — E119 Type 2 diabetes mellitus without complications: Secondary | ICD-10-CM

## 2024-03-28 DIAGNOSIS — E785 Hyperlipidemia, unspecified: Secondary | ICD-10-CM | POA: Diagnosis not present

## 2024-03-28 LAB — CBC
HCT: 39.5 % (ref 39.0–52.0)
Hemoglobin: 12.5 g/dL — ABNORMAL LOW (ref 13.0–17.0)
MCH: 27.4 pg (ref 26.0–34.0)
MCHC: 31.6 g/dL (ref 30.0–36.0)
MCV: 86.6 fL (ref 80.0–100.0)
Platelets: 254 10*3/uL (ref 150–400)
RBC: 4.56 MIL/uL (ref 4.22–5.81)
RDW: 17.8 % — ABNORMAL HIGH (ref 11.5–15.5)
WBC: 9.5 10*3/uL (ref 4.0–10.5)
nRBC: 0 % (ref 0.0–0.2)

## 2024-03-28 LAB — BASIC METABOLIC PANEL WITH GFR
Anion gap: 12 (ref 5–15)
BUN: 13 mg/dL (ref 8–23)
CO2: 26 mmol/L (ref 22–32)
Calcium: 10.1 mg/dL (ref 8.9–10.3)
Chloride: 103 mmol/L (ref 98–111)
Creatinine, Ser: 1.01 mg/dL (ref 0.61–1.24)
GFR, Estimated: >60 mL/min (ref >60)
Glucose, Bld: 165 mg/dL — ABNORMAL HIGH (ref 70–99)
Potassium: 4.3 mmol/L (ref 3.5–5.1)
Sodium: 141 mmol/L (ref 135–145)

## 2024-03-28 LAB — GLUCOSE, CAPILLARY
Glucose-Capillary: 168 mg/dL — ABNORMAL HIGH (ref 70–99)
Glucose-Capillary: 161 mg/dL — ABNORMAL HIGH (ref 70–99)
Glucose-Capillary: 132 mg/dL — ABNORMAL HIGH (ref 70–99)
Glucose-Capillary: 208 mg/dL — ABNORMAL HIGH (ref 70–99)

## 2024-03-28 MED ORDER — LACTATED RINGERS IV SOLN: INTRAVENOUS | Status: AC

## 2024-03-28 MED ORDER — CARBIDOPA-LEVODOPA ER 50-200 MG PO TBCR
1.0000 | EXTENDED_RELEASE_TABLET | Freq: Every day | ORAL | Status: DC
  Administered 2024-03-28 – 2024-03-30 (×3): 1 via ORAL
  Filled 2024-03-28 (×4): qty 1

## 2024-03-28 MED ORDER — CARBIDOPA-LEVODOPA ER 25-100 MG PO TBCR
1.0000 | EXTENDED_RELEASE_TABLET | Freq: Three times a day (TID) | ORAL | Status: DC
  Administered 2024-03-29 – 2024-03-31 (×8): 1 via ORAL
  Filled 2024-03-28 (×9): qty 1

## 2024-03-28 NOTE — Progress Notes (Signed)
 Occupational Therapy Treatment Patient Details Name: Jordan [name] MRN: [id number] DOB: Mar 18, 1941 Today's Date: 03/28/2024   History of present illness Pt is 83 yo presenting to [facility] ED on 5/21 due to R sided weakness and aphasia; MRI brain also negative for acute stroke; recent hospitalization.   PMHx: Parkinsons, HLD, OSA, DM, HTN, chronic diastolic HF, CVA, frequent falls, L2 compression fx, L hip fx, metastatic neuroendocrine tumor.   OT comments  Patient seen in conjunction with PT in order to further progress with functional deficits. Patient alert and talkative, motivated to sit up. Patients BP WNL in supine, but did note a drop, when sitting EOB (see BP levels below). Patient with further drop in BP, however PT and OT attempting squat pivot transfer to recliner in hopes of regulating pressures for increased OOB activities. Patient with significant posterior lean and tremulous when attempted, and unable to complete safely. Patient noted to be more lethargic when returned to supine, however would still follow commands with increased time. Patient with no new nuero deficits observed. Recommendation remains appropriate; will continue to follow.   BP sitting EOB: 107/74 (82) BP sitting after 5 minutes: 84/66 (73) BP in supine: 131/80 (93)      If plan is discharge home, recommend the following:  Two people to help with walking and/or transfers;A lot of help with bathing/dressing/bathroom;Assistance with cooking/housework;Assistance with feeding;Direct supervision/assist for medications management;Assist for transportation;Help with stairs or ramp for entrance   Equipment Recommendations  None recommended by OT    Recommendations for Other Services      Precautions / Restrictions Precautions Precautions: Fall Recall of Precautions/Restrictions: Impaired Precaution/Restrictions Comments: watch BP Restrictions Weight Bearing Restrictions Per Provider Order: No       Mobility Bed  Mobility Overal bed mobility: Needs Assistance Bed Mobility: Supine to Sit, Sit to Supine     Supine to sit: Mod assist, +2 for physical assistance, +2 for safety/equipment, Used rails Sit to supine: Max assist, +2 for physical assistance, +2 for safety/equipment   General bed mobility comments: Mod A of 2 to transition to EOB with bed rails, patient max A of 2 to return to supine, with increased posterior lean    Transfers Overall transfer level: Needs assistance Equipment used: 2 person hand held assist Transfers: Bed to chair/wheelchair/BSC     Squat pivot transfers: Max assist, +2 physical assistance, +2 safety/equipment       General transfer comment: PT and OT completing face to face squat pivot transfer, patient with initial good initation to come into standing to readjust bed pad, OT and PT attempting again, however with significant posterior lean and tremulous and unable to complete transfer safely     Balance Overall balance assessment: Needs assistance Sitting-balance support: Feet supported, Bilateral upper extremity supported Sitting balance-Leahy Scale: Poor Sitting balance - Comments: R lateral lean requiring increased cues and up to mod A to manage Postural control: Right lateral lean   Standing balance-Leahy Scale: Poor Standing balance comment: unable to come fully into standing                           ADL either performed or assessed with clinical judgement   ADL Overall ADL's : Needs assistance/impaired                     Lower Body Dressing: Maximal assistance;+2 for physical assistance;+2 for safety/equipment;Sit to/from stand   Toilet Transfer: Maximal assistance;+2 for physical assistance;+2  for safety/equipment;Squat-pivot Toilet Transfer Details (indicate cue type and reason): attempting squat pivot, however patient with significant posteior lean and tremulous, unable to complete safely.         Functional mobility during  ADLs: +2 for physical assistance;+2 for safety/equipment;Maximal assistance;Cueing for safety;Cueing for sequencing General ADL Comments: Patient seen in conjunction with PT in order to further progress with functional deficits. Patient alert and talkative, motivated to sit up. Patients BP WNL in supine, but did note a drop, when sitting EOB (see BP levels below). Patient with further drop in BP, however PT and OT attempting squat pivot transfer to recliner in hopes of regulating pressures for increased OOB activities. Patient with significant posterior lean and tremulous when attempted, and unable to complete safely. Patient noted to be more lethargic when returned to supine, however would still follow commands with increased time. Patient with no new nuero deficits observed. Recommendation remains appropriate; will continue to follow.    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[employer], Engineer, structural memory Attention impairment (select first level of impairment): Selective attention Executive functioning impairment (select all impairments): Initiation, Organization, Reasoning, Problem solving OT - Cognition Comments: Patient alert at the beginning of the session, talking and conversing with OT and PT, after attempting transfer, patient requiring increased time to repond, and immmediately going to sleep after being laid down, patient would open eyes and follow commands with multi-modal cues                  Following commands: Impaired Following commands impaired: Follows one step commands with increased time, Follows one step commands inconsistently      Cueing   Cueing Techniques: Verbal cues, Gestural cues, Tactile cues  Exercises      Shoulder Instructions       General Comments See BP levels    Pertinent Vitals/ Pain       Pain Assessment Pain Assessment: Faces Faces Pain Scale: No hurt  Home Living                                          Prior Functioning/Environment              Frequency  Min 2X/week        Progress Toward Goals  OT Goals(current goals can now be found in the care plan section)  Progress towards OT goals: Progressing toward goals  Acute Rehab OT Goals Patient Stated Goal: to get better OT Goal Formulation: With patient/family Time For Goal Achievement: 04/06/24 Potential to Achieve Goals: Good  Plan      Co-evaluation                 AM-PAC OT "6 Clicks" Daily Activity     Outcome Measure   Help from another person eating meals?: A Little Help from another person taking care of personal grooming?: A Lot Help from another person toileting, which includes using toliet, bedpan, or urinal?:  A Lot Help from another person bathing (including washing, rinsing, drying)?: A Lot Help from another person to put on and taking off regular upper body clothing?: A Lot Help from another person to put on and taking off regular lower body clothing?: A Lot 6 Click Score: 13    End of Session Equipment Utilized During Treatment: Gait belt  OT Visit Diagnosis: Unsteadiness on feet (R26.81);Other abnormalities of gait and mobility (R26.89);Muscle weakness (generalized) (M62.81);Other symptoms and signs involving cognitive function   Activity Tolerance Patient tolerated treatment well   Patient Left in bed;with call bell/phone within reach;with bed alarm set;with restraints reapplied   Nurse  Communication Mobility status        Time: 1610-9604 OT Time Calculation (min): 24 min  Charges: OT General Charges $OT Visit: 1 Visit OT Treatments $Self Care/Home Management : 8-22 mins  Mollie Anger E. Mackenna Kamer, OTR/L Acute Rehabilitation Services [phone]   Vincent Greek 03/28/2024, 11:34 AM

## 2024-03-28 NOTE — Progress Notes (Addendum)
 PROGRESS NOTE    Jordan [name]  ZOX:[id number] DOB: 1941-09-16 DOA: 03/21/2024 PCP: Roselind Congo, MD   Brief Narrative: 83 year old past medical history significant for CVA, diabetes type 2, chronic diastolic heart failure, arthritis, Parkinson's disease, CAD, DVT, gout, hypertension, hyperlipidemia, OSA on CPAP, metastatic neuroendocrine tumor presented with right-sided weakness and aphasia.  He was recently hospitalized from 4/26 until 5/2 for encephalopathy that was felt to be due to medication side effect of Seroquel .  CT and EEG were also unremarkable at that time.  He Seroquel  was adjusted to be given at 5 PM which helped with hallucination agitation without oversedation.  This hospitalization he presented with aphasia and right-sided weakness.  Symptoms were slowly improving upon admission.  He was admitted for repeat stroke workup.  Thought to had had a TIA.  5/28 morning nurse thought that he had a new right facial droop and patient has been more confused and agitated.  Plan to proceed with CT head, neurology reconsulted.   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  Thought to had a TIA. - MRI brain was also negative for acute stroke.  CT head on admission showed remote infarct involving anterior left frontal lobe and remote lacunar infarct in the cerebellum bilaterally. - Continue with Xarelto  -5/28; noticed to have worsening strokelike  symptoms, right facial droop worsening confusion.  CT head stat ordered.  Neurology reconsulted.  Plan to repeat MRI ECHO Ef 55 %  2-Physical deconditioning: PT OT recommending SNF Awaiting insurance approval  Parkinson's disease: He probably has a component of underlying dementia in the setting of Parkinson disease Continue Sinemet  and modafinil  To avoid oversedation Seroquel  was changed to daily around 5 PM.  May have also be causing some dystonia with a twice a day dosing -  History of DVT: Continue with Xarelto   History of CVA CT head showed remote infarct left frontal lobe and remote lacunar infarct in the cerebellum Lipitor and Xarelto   Essential hypertension: Orthostatic Hypotension.  Midodrine  for orthostatic hypotension Will order TEd hose and abdominal binder.   Paroxysmal  A-fib: Continue with Xarelto    Chronic diastolic heart failure (HCC) - No signs/symptoms of exacerbation - Follow-up repeat echo -Last echo 01/06/2022 showed EF 60 to 65%, indeterminate diastolic parameters   Type 2 diabetes mellitus with hyperlipidemia (HCC) - A1c 6.4% - Continue SSI and CBG monitoring   HLD (hyperlipidemia) - LDL 55 - Continue Lipitor   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 If pt undergoes a cardiac device procedure during that time, they should be re-cleared.  Tachy-therapies to be programmed off if applicable with device back to pre-MRI settings after completion of exam. 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 (Active)  01/06/24 1130  Location: Coccyx  Location Orientation: Posterior  Staging: Stage 1 -  Intact skin with non-blanchable redness of a localized area usually over a bony prominence.  Wound Description (Comments):   Present on Admission:   Dressing Type Foam - Lift dressing to assess site every shift 03/27/24 0800       Estimated body mass index is 30.8 kg/m as calculated from the following:   Height as of 02/25/24: 5\' 9"  (1.753 m).   Weight as of this encounter: 94.6 kg.   DVT prophylaxis: Xarelto  Code Status: Full code Family Communication: Try to contact wife, she didn't answer phone Disposition Plan:  Status is: Inpatient Remains inpatient appropriate because: management of Encephalopathy    Consultants:  Neurology   Procedures:    Antimicrobials:    Subjective: Patient notice to have right facial.  droop by nurse around 10;20 AM, unknown last time he was normal. During report this morning, he was as sleep, unable to evaluate for facial droop. Work with therapist and was notice to have facial droop as well. Per night report he was agitated this am.  He is able to moves all 4 extremities, able to name object, speech is clear, tongue is midline, mild right facial droop, discussed with neurology, plan to get Stat CT head. And them MRI  Objective: Vitals:   03/27/24 1958 03/28/24 0038 03/28/24 0413 03/28/24 1022  BP: (!) 164/93 120/66 102/60 (!) 137/97  Pulse: 81 (!) 59 65 74  Resp: 18 18 16  (!) 94  Temp:  98.6 F (37 C) 99.3 F (37.4 C) 98.6 F (37 C) 99 F (37.2 C)  TempSrc: Oral Oral Oral Oral  SpO2: 100% 95% 94%   Weight:        Intake/Output Summary (Last 24 hours) at 03/28/2024 1028 Last data filed at 03/28/2024 0059 Gross per 24 hour  Intake --  Output 750 ml  Net -750 ml   Filed Weights   03/21/24 1700  Weight: 94.6 kg    Examination:  General exam: Appears calm and comfortable  Respiratory system: Clear to auscultation. Respiratory effort normal. Cardiovascular system: S1 & S2 heard, RRR. No JVD, murmurs, rubs, gallops or clicks. No pedal edema. Gastrointestinal system: Abdomen is nondistended, soft and nontender. No organomegaly or masses felt. Normal bowel sounds heard. Central nervous system: Alert and confuse, agitated at times, mild right facial droop, speech was clear, moves all 4 extremities. Less movement of left LE   Data Reviewed: I have personally reviewed following labs and imaging studies  CBC: Recent Labs  Lab 03/21/24 1731 03/21/24 1737 03/22/24 0500 03/23/24 1051  WBC 7.9  --  7.3 7.9  NEUTROABS 4.7  --   --  4.6  HGB 12.3* 12.6* 11.3* 12.5*  HCT 38.6* 37.0* 35.8* 38.2*  MCV 87.7  --  86.7 85.1  PLT 219  --  227 225   Basic Metabolic Panel: Recent Labs  Lab 03/21/24 1731 03/21/24 1737 03/22/24 0500 03/23/24 1051  NA 139 141 138 140  K 3.3* 3.4* 3.1* 3.5  CL 107 105 104 105  CO2 22  --  24 26  GLUCOSE 194* 195* 149* 154*  BUN 13 14 11  7*  CREATININE 0.94 1.00 0.90 0.77  CALCIUM  9.3  --  9.3 9.2  MG  --   --  1.5* 1.6*  PHOS  --   --  2.8  --    GFR: Estimated Creatinine Clearance: 78.1 mL/min (by C-G formula based on SCr of 0.77 mg/dL). Liver Function Tests: Recent Labs  Lab 03/21/24 1731  AST 26  ALT 6  ALKPHOS 62  BILITOT 0.5  PROT 6.7  ALBUMIN 3.2*   No results for input(s): "LIPASE", "AMYLASE" in the last 168 hours. No results for input(s): "AMMONIA" in the last 168 hours. Coagulation Profile: Recent Labs  Lab  03/21/24 1731  INR 2.0*   Cardiac Enzymes: No results for input(s): "CKTOTAL", "CKMB", "CKMBINDEX", "TROPONINI" in the last 168 hours. BNP (last 3 results) No results for input(s): "PROBNP" in the last 8760 hours. HbA1C: No results for input(s): "HGBA1C" in the last 72 hours. CBG: Recent Labs  Lab 03/26/24 2123 03/27/24 0631 03/27/24 1211 03/27/24 1620 03/27/24 2122  GLUCAP 152* 146* 157* 159* 188*   Lipid Profile: No results for input(s): "CHOL", "HDL", "LDLCALC", "TRIG", "CHOLHDL", "LDLDIRECT" in the last 72 hours. Thyroid  Function Tests: No results for input(s): "TSH", "T4TOTAL", "FREET4", "T3FREE", "THYROIDAB" in the last 72 hours. Anemia Panel: No results for input(s): "VITAMINB12", "FOLATE", "FERRITIN", "TIBC", "IRON ", "RETICCTPCT" in the last 72 hours. Sepsis Labs: No results for input(s): "PROCALCITON", "LATICACIDVEN" in the last 168 hours.  No results found for this or any previous visit (from the past 240 hours).       Radiology Studies: No results found.      Scheduled Meds:  atorvastatin   20 mg Oral QHS   carbidopa -levodopa   1 tablet Oral QID   donepezil  5 mg Oral Daily   insulin  aspart  0-6 Units Subcutaneous TID WC   melatonin  10 mg Oral QHS   midodrine   5 mg Oral BID WC   modafinil   200 mg Oral Daily   QUEtiapine   25 mg Oral QPM   rivaroxaban   20 mg Oral Daily   sodium chloride  flush  3 mL Intravenous Q12H   tamsulosin   0.4 mg Oral QHS   Continuous Infusions:  lactated ringers        LOS: 7 days    Time spent: 35 minutes    Gigi Onstad A Analuisa Tudor, MD [facility]   If 7PM-7AM, please contact night-coverage [email or web address]  03/28/2024, 10:28 AM

## 2024-03-28 NOTE — Progress Notes (Addendum)
 STROKE TEAM PROGRESS NOTE   SUBJECTIVE (INTERVAL HISTORY) His family is at the bedside. Pt drowsy sleepy and hard to open eyes, with forced eye opening he is able to keep eyes open briefly, not orientated. Per report, he was found to have right facial droop and left leg weakness.  However on my exam, no significant facial droop or leg weakness, however left upper extremity decreased movement, not sure weakness or guarding for ?  Pain.  Will check CT to rule out bleeding and MRI to rule out stroke.   OBJECTIVE Temp:  [98.6 F (37 C)-99.3 F (37.4 C)] 99 F (37.2 C) (05/28 1022) Pulse Rate:  [59-83] 74 (05/28 1022) Cardiac Rhythm: Atrial paced;Heart block (05/28 0724) Resp:  [16-18] 16 (05/28 0413) BP: (102-164)/(60-97) 137/97 (05/28 1022) SpO2:  [94 %-100 %] 94 % (05/28 1022)  Recent Labs  Lab 03/26/24 2123 03/27/24 0631 03/27/24 1211 03/27/24 1620 03/27/24 2122  GLUCAP 152* 146* 157* 159* 188*   Recent Labs  Lab 03/21/24 1731 03/21/24 1737 03/22/24 0500 03/23/24 1051  NA 139 141 138 140  K 3.3* 3.4* 3.1* 3.5  CL 107 105 104 105  CO2 22  --  24 26  GLUCOSE 194* 195* 149* 154*  BUN 13 14 11  7*  CREATININE 0.94 1.00 0.90 0.77  CALCIUM  9.3  --  9.3 9.2  MG  --   --  1.5* 1.6*  PHOS  --   --  2.8  --    Recent Labs  Lab 03/21/24 1731  AST 26  ALT 6  ALKPHOS 62  BILITOT 0.5  PROT 6.7  ALBUMIN 3.2*   Recent Labs  Lab 03/21/24 1731 03/21/24 1737 03/22/24 0500 03/23/24 1051  WBC 7.9  --  7.3 7.9  NEUTROABS 4.7  --   --  4.6  HGB 12.3* 12.6* 11.3* 12.5*  HCT 38.6* 37.0* 35.8* 38.2*  MCV 87.7  --  86.7 85.1  PLT 219  --  227 225   No results for input(s): "CKTOTAL", "CKMB", "CKMBINDEX", "TROPONINI" in the last 168 hours. No results for input(s): "LABPROT", "INR" in the last 72 hours. No results for input(s): "COLORURINE", "LABSPEC", "PHURINE", "GLUCOSEU", "HGBUR", "BILIRUBINUR", "KETONESUR", "PROTEINUR", "UROBILINOGEN", "NITRITE", "LEUKOCYTESUR" in the last 72  hours.  Invalid input(s): "APPERANCEUR"     Component Value Date/Time   CHOL 114 03/22/2024 0500   TRIG 99 03/22/2024 0500   HDL 39 (L) 03/22/2024 0500   CHOLHDL 2.9 03/22/2024 0500   VLDL 20 03/22/2024 0500   LDLCALC 55 03/22/2024 0500   Lab Results  Component Value Date   HGBA1C 6.4 (H) 03/22/2024      Component Value Date/Time   LABOPIA NONE DETECTED 03/21/2024 2100   COCAINSCRNUR NONE DETECTED 03/21/2024 2100   LABBENZ NONE DETECTED 03/21/2024 2100   AMPHETMU NONE DETECTED 03/21/2024 2100   THCU NONE DETECTED 03/21/2024 2100   LABBARB NONE DETECTED 03/21/2024 2100    Recent Labs  Lab 03/21/24 1735  ETH <15    I have personally reviewed the radiological images below and agree with the radiology interpretations.  ECHOCARDIOGRAM COMPLETE Result Date: 03/22/2024    ECHOCARDIOGRAM REPORT   Patient Name:   Jordan [name] Date of Exam: 03/22/2024 Medical Rec #:  [id number]       Height:       69.0 in Accession #:    [id number]      Weight:       208.6 lb Date of Birth:  [birth year]  BSA:          2.103 m Patient Age:    83 years        BP:           133/87 mmHg Patient Gender: M               HR:           68 bpm. Exam Location:  Inpatient Procedure: 2D Echo, Cardiac Doppler, Color Doppler, Intracardiac Opacification            Agent and Saline Contrast Bubble Study (Both Spectral and Color Flow            Doppler were utilized during procedure). Indications:    Stroke  History:        Patient has prior history of Echocardiogram examinations, most                 recent 01/06/2022. Signs/Symptoms:Shortness of Breath; Risk                 Factors:Sleep Apnea, Dyslipidemia, Hypertension and Diabetes.                 DVT, DOE.  Sonographer:    Juanita Shaw Referring Phys: [id number] DEVON SHAFER IMPRESSIONS  1. Left ventricular ejection fraction, by estimation, is 55 to 60%. The left ventricle has normal function. Left ventricular endocardial border not optimally defined to evaluate  regional wall motion. Left ventricular diastolic parameters are indeterminate.  2. Right ventricular systolic function is normal. The right ventricular size is normal. There is normal pulmonary artery systolic pressure.  3. The mitral valve was not well visualized. No evidence of mitral valve regurgitation. No evidence of mitral stenosis.  4. The aortic valve was not well visualized. There is moderate calcification of the aortic valve. Aortic valve regurgitation is not visualized. No aortic stenosis is present.  5. The inferior vena cava is normal in size with greater than 50% respiratory variability, suggesting right atrial pressure of 3 mmHg. FINDINGS  Left Ventricle: Left ventricular ejection fraction, by estimation, is 55 to 60%. The left ventricle has normal function. Left ventricular endocardial border not optimally defined to evaluate regional wall motion. The left ventricular internal cavity size was normal in size. There is no left ventricular hypertrophy. Left ventricular diastolic function could not be evaluated due to atrial fibrillation. Left ventricular diastolic parameters are indeterminate. Right Ventricle: The right ventricular size is normal. No increase in right ventricular wall thickness. Right ventricular systolic function is normal. There is normal pulmonary artery systolic pressure. The tricuspid regurgitant velocity is 2.02 m/s, and  with an assumed right atrial pressure of 3 mmHg, the estimated right ventricular systolic pressure is 19.3 mmHg. Left Atrium: Left atrial size was normal in size. Right Atrium: Right atrial size was normal in size. Pericardium: There is no evidence of pericardial effusion. Mitral Valve: The mitral valve was not well visualized. Mild mitral annular calcification. No evidence of mitral valve regurgitation. No evidence of mitral valve stenosis. MV peak gradient, 4.9 mmHg. The mean mitral valve gradient is 2.0 mmHg. Tricuspid Valve: The tricuspid valve is not well  visualized. Tricuspid valve regurgitation is not demonstrated. No evidence of tricuspid stenosis. Aortic Valve: The aortic valve was not well visualized. There is moderate calcification of the aortic valve. There is moderate aortic valve annular calcification. Aortic valve regurgitation is not visualized. No aortic stenosis is present. Aortic valve mean gradient measures 2.0 mmHg. Aortic valve peak gradient measures 4.8  mmHg. Aortic valve area, by VTI measures 3.30 cm. Pulmonic Valve: The pulmonic valve was not well visualized. Pulmonic valve regurgitation is not visualized. No evidence of pulmonic stenosis. Aorta: The aortic root is normal in size and structure. Venous: The inferior vena cava is normal in size with greater than 50% respiratory variability, suggesting right atrial pressure of 3 mmHg. IAS/Shunts: No atrial level shunt detected by color flow Doppler.  LEFT VENTRICLE PLAX 2D LVIDd:         4.90 cm      Diastology LVIDs:         3.30 cm      LV e' medial:    5.44 cm/s LV PW:         0.90 cm      LV E/e' medial:  12.8 LV IVS:        0.90 cm      LV e' lateral:   4.51 cm/s LVOT diam:     1.80 cm      LV E/e' lateral: 15.5 LV SV:         52 LV SV Index:   25 LVOT Area:     2.54 cm  LV Volumes (MOD) LV vol d, MOD A2C: 185.0 ml LV vol d, MOD A4C: 154.0 ml LV vol s, MOD A2C: 48.6 ml LV vol s, MOD A4C: 52.7 ml LV SV MOD A2C:     136.4 ml LV SV MOD A4C:     154.0 ml LV SV MOD BP:      114.2 ml RIGHT VENTRICLE             IVC RV Basal diam:  3.50 cm     IVC diam: 1.50 cm RV Mid diam:    2.70 cm RV S prime:     11.30 cm/s LEFT ATRIUM             Index        RIGHT ATRIUM          Index LA diam:        3.70 cm 1.76 cm/m   RA Area:     6.97 cm LA Vol (A2C):   21.1 ml 10.03 ml/m  RA Volume:   7.65 ml  3.64 ml/m LA Vol (A4C):   57.2 ml 27.20 ml/m LA Biplane Vol: 36.6 ml 17.40 ml/m  AORTIC VALVE                    PULMONIC VALVE AV Area (Vmax):    2.47 cm     PV Vmax:       0.60 m/s AV Area (Vmean):   2.50  cm     PV Peak grad:  1.5 mmHg AV Area (VTI):     3.30 cm AV Vmax:           109.00 cm/s AV Vmean:          66.900 cm/s AV VTI:            0.159 m AV Peak Grad:      4.8 mmHg AV Mean Grad:      2.0 mmHg LVOT Vmax:         106.00 cm/s LVOT Vmean:        65.800 cm/s LVOT VTI:          0.206 m LVOT/AV VTI ratio: 1.30  AORTA Ao Root diam: 2.90 cm Ao Asc diam:  2.80 cm MITRAL VALVE  TRICUSPID VALVE MV Area (PHT): 2.10 cm     TR Peak grad:   16.3 mmHg MV Area VTI:   2.09 cm     TR Vmax:        202.00 cm/s MV Peak grad:  4.9 mmHg MV Mean grad:  2.0 mmHg     SHUNTS MV Vmax:       1.11 m/s     Systemic VTI:  0.21 m MV Vmean:      68.0 cm/s    Systemic Diam: 1.80 cm MV Decel Time: 362 msec MV E velocity: 69.80 cm/s MV A velocity: 105.00 cm/s MV E/A ratio:  0.66 Aditya Sabharwal Electronically signed by Alwin Baars Signature Date/Time: 03/22/2024/5:14:37 PM    Final    MR BRAIN WO CONTRAST Result Date: 03/22/2024 CLINICAL DATA:  Stroke follow-up. History of metastatic neuroendocrine tumor. EXAM: MRI HEAD WITHOUT CONTRAST TECHNIQUE: Multiplanar, multiecho pulse sequences of the brain and surrounding structures were obtained without intravenous contrast. COMPARISON:  Head CT from yesterday FINDINGS: Brain: No acute infarction, hemorrhage, hydrocephalus, extra-axial collection or mass lesion. Small chronic infarcts scattered in the bilateral cerebellum. Chronic lacunar infarcts in the bilateral thalamus and deep white matter. Encephalomalacia in the anterior left frontal lobe adjacent to a calvarial defect, presumably from remote trauma. Generalized brain atrophy. Chronic small vessel ischemia in the cerebral white matter. Chronic right occipital cortex infarct. Vascular: Major flow voids are preserved Skull and upper cervical spine: Normal marrow signal Sinuses/Orbits: Bilateral cataract resection. Complete opacification of the left frontal sinus in proximity to the described skull deformity. In general  sinus inflammation has improved from 12/22/2023 head CT. IMPRESSION: No acute or reversible finding. Brain atrophy and small chronic infarcts as described. Electronically Signed   By: Ronnette Coke M.D.   On: 03/22/2024 09:25   EEG adult Result Date: 03/22/2024 Arleene Lack, MD     03/22/2024  8:00 AM Patient Name: Jordan [name] MRN: [id number] Epilepsy Attending: Arleene Lack Referring Physician/Provider: Imogene Mana, NP Date: 03/21/2024 Duration: 22.44 mins Patient history: 83yo M with left gaze preference and right-sided weakness. EEG to evaluate for seizure Level of alertness: Awake AEDs during EEG study: None Technical aspects: This EEG study was done with scalp electrodes positioned according to the 10-20 International system of electrode placement. Electrical activity was reviewed with band pass filter of 1-70Hz , sensitivity of 7 uV/mm, display speed of 70mm/sec with a 60Hz  notched filter applied as appropriate. EEG data were recorded continuously and digitally stored.  Video monitoring was available and reviewed as appropriate. Description: The posterior dominant rhythm consists of 7 Hz activity of moderate voltage (25-35 uV) seen predominantly in posterior head regions, symmetric and reactive to eye opening and eye closing. EEG showed continuous generalized predominantly 5 to 6 Hz theta slowing admixed with intermittent 2-3hz  delta slowing.  Hyperventilation and photic stimulation were not performed.   ABNORMALITY - Continuous slow, generalized IMPRESSION: This study is suggestive of moderate diffuse encephalopathy. No seizures or epileptiform discharges were seen throughout the recording. Priyanka Suzanne Erps   CT ANGIO HEAD NECK W WO CM (CODE STROKE) Result Date: 03/21/2024 CLINICAL DATA:  Neuro deficit, concern for stroke, right-sided weakness, slurred speech. EXAM: CT ANGIOGRAPHY HEAD AND NECK WITH AND WITHOUT CONTRAST TECHNIQUE: Multidetector CT imaging of the head and neck was performed  using the standard protocol during bolus administration of intravenous contrast. Multiplanar CT image reconstructions and MIPs were obtained to evaluate the vascular anatomy. Carotid stenosis measurements (when applicable) are obtained  utilizing NASCET criteria, using the distal internal carotid diameter as the denominator. RADIATION DOSE REDUCTION: This exam was performed according to the departmental dose-optimization program which includes automated exposure control, adjustment of the mA and/or kV according to patient size and/or use of iterative reconstruction technique. CONTRAST:  75mL OMNIPAQUE  IOHEXOL  350 MG/ML SOLN COMPARISON:  CT head earlier same day. FINDINGS: CTA NECK FINDINGS Aortic arch: Standard configuration of the aortic arch. Imaged portion shows no evidence of aneurysm or dissection. Mild atherosclerosis along the aortic arch and visualized proximal descending thoracic aorta. No significant stenosis of the major arch vessel origins. Pulmonary arteries: As permitted by contrast timing, there are no filling defects in the visualized pulmonary arteries. Subclavian arteries: The subclavian arteries are patent bilaterally. Right carotid system: Atherosclerosis noted along the distal brachiocephalic artery without significant narrowing at the origin of the right common carotid artery. Right carotid artery is patent from the origin to the skull base. Calcified atherosclerosis at the carotid bifurcation resulting in approximately 30% stenosis. Left carotid system: Patent from the origin to the skull base. Calcified atherosclerosis at the carotid bifurcation without hemodynamically significant stenosis. Vertebral arteries: Codominant. No evidence of dissection, stenosis (50% or greater), or occlusion. Atherosclerosis of the bilateral V4 segments resulting in mild stenosis. Skeleton: No acute findings. Degenerative changes in the cervical spine. Disc space narrowing most pronounced at C4-5 and C5-6. Other  neck: The visualized airway is patent. No cervical lymphadenopathy. Upper chest: Atelectasis in the dependent aspect of both lungs. Partially visualized left chest wall pacer device. Review of the MIP images confirms the above findings CTA HEAD FINDINGS ANTERIOR CIRCULATION: The intracranial ICAs are patent bilaterally. Atherosclerosis of the bilateral carotid siphons. Mild stenosis of the right supraclinoid ICA. No significant stenosis, proximal occlusion, aneurysm, or vascular malformation. MCAs: The middle cerebral arteries are patent bilaterally. ACAs: The anterior cerebral arteries are patent bilaterally. POSTERIOR CIRCULATION: No significant stenosis, proximal occlusion, aneurysm, or vascular malformation. PCAs: Patent bilaterally. Focal severe stenosis of the P2 segment right PCA Pcomm: Not well visualized. SCAs: The superior cerebellar arteries are patent bilaterally. Basilar artery: Patent AICAs: Visualized on the left. PICAs: Visualized on the right. Vertebral arteries: The intracranial vertebral arteries are patent. Venous sinuses: As permitted by contrast timing, patent. Anatomic variants: None Review of the MIP images confirms the above findings IMPRESSION: No large vessel occlusion. Focal severe stenosis of the P2 segment right PCA. Otherwise no high-grade stenosis identified. Atherosclerosis at the carotid bifurcations. 30% stenosis at the origin of the right cervical ICA. Mild stenosis of the right supraclinoid ICA. Additional mild stenosis of the V4 segments of the bilateral vertebral arteries. Aortic Atherosclerosis (ICD10-I70.0). Electronically Signed   By: Denny Flack M.D.   On: 03/21/2024 18:54   CT HEAD CODE STROKE WO CONTRAST Result Date: 03/21/2024 EXAM: CT HEAD WITHOUT 03/21/2024 05:33:57 PM TECHNIQUE: CT of the head was performed without the administration of intravenous contrast. Automated exposure control, iterative reconstruction, and/or weight based adjustment of the mA/kV was  utilized to reduce the radiation dose to as low as reasonably achievable. COMPARISON: CT head without contrast 02/25/2024. CLINICAL HISTORY: Neuro deficit, acute, stroke suspected. R/S weakness, slurred speech. FINDINGS: BRAIN AND VENTRICLES: Remote infarct is noted in the anterior left frontal lobe. Remote lacunar infarcts are present in the cerebellum bilaterally. No acute infarct, hemorrhage, or mass lesion is present. Moderate atrophy and white matter changes bilaterally are stable. The ventricles are proportionate to the degree of atrophy. ASPECTS: . Ganglionic level: 7/7 Supraganglionic level:3/3. Total:  10/10 . Aaron Aas ORBITS: Lens replacements are noted bilaterally. The globes and orbits are otherwise within normal limits. SINUSES: A posterior right ethmoid air cell is opacified. Anterior left ethmoid air cells and the left frontal sinus are opacified. Chronic wall thickening is noted in the left maxillary sinus. Minimal secretions are present in the left maxillary sinus. No fluid level is present. SOFT TISSUES AND SKULL: No acute abnormality of the visualized skull or soft tissues. VASCULATURE: Atherosclerotic calcifications are present within the cavernous internal carotid arteries bilaterally. No hyperdense vessel is present. IMPRESSION: 1. No acute intracranial abnormality. 2. Remote infarct in the anterior left frontal lobe and remote lacunar infarcts in the cerebellum bilaterally. 3. Stable moderate atrophy and white matter changes bilaterally. Key findings were texted via amion to Dr. Cleone Dad at 5:51pm Electronically signed by: Audree Leas MD 03/21/2024 05:52 PM EDT RP Workstation: [id number]     PHYSICAL EXAM  Temp:  [98.6 F (37 C)-99.3 F (37.4 C)] 99 F (37.2 C) (05/28 1022) Pulse Rate:  [59-83] 74 (05/28 1022) Resp:  [16-18] 16 (05/28 0413) BP: (102-164)/(60-97) 137/97 (05/28 1022) SpO2:  [94 %-100 %] 94 % (05/28 1022)  General - Well nourished, well developed, in no apparent  distress, drowsy sleepy.  Ophthalmologic - fundi not visualized due to noncooperation.  Cardiovascular - Regular rhythm and rate.  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  Paucity of speech, moderate dysarthria, not quite following commands.  With forced eye opening, he was able to briefly open eyes, no gaze palsy, visual field full. No significant facial droop. Tongue protrusion not cooperative.  Right upper extremity against gravity without significant drift, left upper extremity increased muscle tone and tripped quickly to bed, not sure if weakness versus guarding. Bilaterally LEs 3/5 on the flexion position with feet on bed, no sliding down. Sensation, coordination and gait not tested.   ASSESSMENT/PLAN Jordan [name] is a 83 y.o. male with history of Parkinson's disease, s/p pacemaker CHF, CAD, history of DVT on Xarelto , gout, hyperlipidemia, hypertension, narcolepsy, OSA on CPAP, stroke, diabetes admitted for right-sided weakness and aphasia. MRI negative. But on 5/28 found to have right facial droop and left LE weakness, neuro called back    AMS with left UE decreased movement 5/28 Patient drowsy sleepy, but no significant facial droop or left leg weakness but found to have left upper extremity decreased movement Not sure if real weakness versus guarding from pain Last seen well not clear, not TNK or IR candidate CT stat to rule out bleeding MRI repeat to rule out stroke Pacemaker interrogation to rule out A-fib EEG to rule out seizure  Left hemispheric TIA 5/21 Code Stroke  CT head No acute abnormality.   Remote infarct in the anterior left frontal lobe and remote lacunar infarcts in the cerebellum bilaterally Small vessel disease. Atrophy.  ASPECTS 10.     CTA head & neck no LVO. Focal severe stenosis of the P2 segment right PCA. Otherwise no high-grade stenosis  identified. MRI no acute process 2D Echo EF 55 to 60%.  Left atrial size normal. LDL 55 HgbA1c 6.8 VTE prophylaxis -Xarelto  Xarelto  (rivaroxaban ) daily prior to admission, now on Xarelto  (rivaroxaban ) daily Therapy recommendations: SNF Disposition: Pending   Hx of Stroke/TIA Parkinson's disease Remote infarct in the anterior left frontal lobe and remote lacunar infarcts in the cerebellum bilaterally  On Sinemet ,  Provigil , Seroquel  at night   History of DVT LE venous Doppler in 04/2022 found left lower extremity DVT on Xarelto  since   History of orthostatic hypotension Home meds: Midodrine  5 mg Stable Long-term BP goal normotensive   Hyperlipidemia Home meds: Atorvastatin  20 mg, resumed in hospital LDL 55, goal < 70 Continue statin at discharge   Diabetes type II Controlled Home meds: Metformin  HgbA1c 6.8, goal < 7.0 CBGs SSI Recommend close follow-up with PCP for better DM control   Other Stroke Risk Factors Obesity, Body mass index is 30.8 kg/m., BMI >/= 30 associated with increased stroke risk, recommend weight loss, diet and exercise as appropriate  Coronary artery disease Congestive heart failure Obstructive sleep apnea, on CPAP at home    Other Active Problems Metastatic neuroendocrine tumor  Hospital day # 7  I discussed with Dr. Landrum Pink. I spent extensive total face-to-face time with the patient and family, reviewing test results, images and medication, and discussing the diagnosis, treatment plan and potential prognosis. This patient's care requiresreview of multiple databases, neurological assessment, discussion with family, other specialists and medical decision making of high complexity.  Consuelo Denmark, MD PhD Stroke Neurology 03/28/2024 11:09 AM    To contact Stroke Continuity provider, please refer to [email or web address]. After hours, contact General Neurology

## 2024-03-28 NOTE — Progress Notes (Signed)
 SLP Cancellation Note  Patient Details Name: Jordan [name] MRN: [id number] DOB: [birth year]   Cancelled treatment:       Reason Eval/Treat Not Completed: Fatigue/lethargy limiting ability to participate. Pt's wife present and speaking to providers at time of attempt. W/u is ongoing regarding new onset confusion and lethargy. SLP will continue following.    Amil Kale, M.A., CCC-SLP Speech Language Pathology, Acute Rehabilitation Services  Secure Chat preferred [phone]  03/28/2024, 4:53 PM

## 2024-03-28 NOTE — Procedures (Signed)
 Patient Name: Jordan [name]  MRN: [id number]  Epilepsy Attending: Arleene Lack  Referring Physician/Provider: Consuelo Denmark, MD  Date: 03/28/2024 Duration: 24.08 mins  Patient history: 83yo m with ams. EEG to evaluate for seizure.  Level of alertness: comatose/ lethargic   AEDs during EEG study: None  Technical aspects: This EEG study was done with scalp electrodes positioned according to the 10-20 International system of electrode placement. Electrical activity was reviewed with band pass filter of 1-70Hz , sensitivity of 7 uV/mm, display speed of 68mm/sec with a 60Hz  notched filter applied as appropriate. EEG data were recorded continuously and digitally stored.  Video monitoring was available and reviewed as appropriate.  Description: EEG showed continuous generalized 3 to 6 Hz theta-delta slowing. Hyperventilation and photic stimulation were not performed.     ABNORMALITY - Continuous slow, generalized  IMPRESSION: This study is suggestive of moderate diffuse encephalopathy. No seizures or epileptiform discharges were seen throughout the recording.  Tery Hoeger O Sul[place]ita [place]

## 2024-03-28 NOTE — Progress Notes (Signed)
 EEG complete - results pending

## 2024-03-28 NOTE — Progress Notes (Signed)
 Family placed patient on home CPAP unit for the night

## 2024-03-28 NOTE — TOC Progression Note (Signed)
 Transition of Care [facility]) - Progression Note    Patient Details  Name: Jordan [name] MRN: [id number] Date of Birth: 1941-10-21  Transition of Care [facility]) CM/SW Contact  Jonathan Neighbor, RN Phone Number: 03/28/2024, 3:44 PM  Clinical Narrative:     CM has left VM for Evalyn Hillier at [facility] to see if pt approved for SNF. TOC following.  Expected Discharge Plan: Skilled Nursing Facility Barriers to Discharge: Continued Medical Work up  Expected Discharge Plan and Services   Discharge Planning Services: CM Consult Post Acute Care Choice: IP Rehab Living arrangements for the past 2 months: Single Family Home                                       Social Determinants of Health (SDOH) Interventions SDOH Screenings   Food Insecurity: No Food Insecurity (03/22/2024)  Housing: Low Risk  (03/22/2024)  Transportation Needs: No Transportation Needs (03/22/2024)  Utilities: Not At Risk (03/22/2024)  Depression (PHQ2-9): Low Risk  (04/12/2023)  Social Connections: Moderately Integrated (03/22/2024)  Tobacco Use: Medium Risk (03/21/2024)    Readmission Risk Interventions     No data to display

## 2024-03-28 NOTE — Plan of Care (Signed)
  Problem: Coping: Goal: Will identify appropriate support needs Outcome: Progressing

## 2024-03-28 NOTE — Progress Notes (Signed)
 Physical Therapy Treatment Patient Details Name: Jordan [name] MRN: [id number] DOB: 20-Jun-1941 Today's Date: 03/28/2024   History of Present Illness Pt is 83 yo presenting to [facility] ED on 5/21 due to R sided weakness and aphasia; MRI brain also negative for acute stroke; recent hospitalization.   PMHx: Parkinsons, HLD, OSA, DM, HTN, chronic diastolic HF, CVA, frequent falls, L2 compression fx, L hip fx, metastatic neuroendocrine tumor.    PT Comments  Pt is making very slow progress towards goals. Currently pt has not demonstrated significant improvement from last session at this time and continues with difficulty following directions and initiating movement. Pt 2 person Mod A for bed mobility supine to sitting and 2 person total A for sitting to supine. Pt was 2 person Max A for attempt at squat pivot transfer but unable to complete due to pt resisting movement. Pt currently is limited due to significant drop in BP with sitting EOB (see comments). Due to pt current functional status, home set up and available assistance at home recommending skilled physical therapy services < 3 hours/day in order to address strength, balance and functional mobility to decrease risk for falls, injury, immobility, skin break down and re-hospitalization.      If plan is discharge home, recommend the following: Two people to help with walking and/or transfers;Assistance with cooking/housework;Assist for transportation;Supervision due to cognitive status   Can travel by private vehicle     No  Equipment Recommendations  None recommended by PT       Precautions / Restrictions Precautions Precautions: Fall Recall of Precautions/Restrictions: Impaired Precaution/Restrictions Comments: watch BP Restrictions Weight Bearing Restrictions Per Provider Order: No     Mobility  Bed Mobility Overal bed mobility: Needs Assistance Bed Mobility: Supine to Sit, Sit to Supine     Supine to sit: Mod assist, +2 for physical  assistance, +2 for safety/equipment, Used rails Sit to supine: Max assist, +2 for physical assistance, +2 for safety/equipment   General bed mobility comments: Mod A of 2 to transition to EOB with bed rails, patient max A of 2 to return to supine, with increased posterior lean    Transfers Overall transfer level: Needs assistance Equipment used: 2 person hand held assist Transfers: Bed to chair/wheelchair/BSC       Squat pivot transfers: Max assist, +2 physical assistance, +2 safety/equipment     General transfer comment: PT and OT completing face to face squat pivot transfer, patient with initial good initation to come into standing to readjust bed pad, OT and PT attempting again, however with significant posterior lean, tremors and unable to complete transfer safely due to pt pushing away from chair    Ambulation/Gait   General Gait Details: Deferred due to hypotension      Balance Overall balance assessment: Needs assistance Sitting-balance support: Feet supported, Bilateral upper extremity supported Sitting balance-Leahy Scale: Poor Sitting balance - Comments: R lateral lean requiring increased cues and up to mod A to manage Postural control: Right lateral lean   Standing balance-Leahy Scale: Poor Standing balance comment: unable to come fully into standing        Communication Communication Communication: Impaired Factors Affecting Communication: Hearing impaired;Difficulty expressing self  Cognition Arousal: Alert, Lethargic Behavior During Therapy: Flat affect   PT - Cognitive impairments: Memory, Attention, Sequencing, Problem solving, Safety/Judgement, Awareness, Initiation   Orientation impairments: Place, Situation   PT - Cognition Comments: slow to initiate with A to move legs to EOB, increased time for processing Following commands: Impaired Following  commands impaired: Follows one step commands with increased time, Follows one step commands  inconsistently    Cueing Cueing Techniques: Verbal cues, Gestural cues, Tactile cues     General Comments General comments (skin integrity, edema, etc.): Supine BP 136/78, Sitting 107/74, Sitting after 5 min 84/66, Supine 131/80      Pertinent Vitals/Pain Pain Assessment Pain Assessment: Faces Faces Pain Scale: No hurt Breathing: normal Negative Vocalization: none Facial Expression: smiling or inexpressive Body Language: relaxed Consolability: no need to console PAINAD Score: 0 Pain Intervention(s): Monitored during session     PT Goals (current goals can now be found in the care plan section) Acute Rehab PT Goals Patient Stated Goal: none stated PT Goal Formulation: Patient unable to participate in goal setting Time For Goal Achievement: 04/05/24 Potential to Achieve Goals: Fair Progress towards PT goals: Progressing toward goals    Frequency    Min 2X/week      PT Plan  Continue with current POC        AM-PAC PT "6 Clicks" Mobility   Outcome Measure  Help needed turning from your back to your side while in a flat bed without using bedrails?: A Lot Help needed moving from lying on your back to sitting on the side of a flat bed without using bedrails?: A Lot Help needed moving to and from a bed to a chair (including a wheelchair)?: Total Help needed standing up from a chair using your arms (e.g., wheelchair or bedside chair)?: Total Help needed to walk in hospital room?: Total Help needed climbing 3-5 steps with a railing? : Total 6 Click Score: 8    End of Session Equipment Utilized During Treatment: Gait belt Activity Tolerance: Treatment limited secondary to medical complications (Comment);Patient tolerated treatment well Patient left: in bed;with call bell/phone within reach;with bed alarm set Nurse Communication: Mobility status PT Visit Diagnosis: Other abnormalities of gait and mobility (R26.89);Difficulty in walking, not elsewhere classified  (R26.2);Other symptoms and signs involving the nervous system (R29.898)     Time: 4540-9811 PT Time Calculation (min) (ACUTE ONLY): 24 min  Charges:    $Therapeutic Activity: 8-22 mins PT General Charges $$ ACUTE PT VISIT: 1 Visit                     Sloan Duncans, DPT, CLT  Acute Rehabilitation Services Office: [phone] (Secure chat preferred)    Jenice Mitts 03/28/2024, 2:15 PM

## 2024-03-28 NOTE — Plan of Care (Signed)
 Discussed with wife over the phone, she stated that pt sinemet  was recently changed from sinemet  IR to CR, and now on 50-200 CR 4 times a day. But wife felt pt on the current home dose, he had more orthostatic hypotension and hallucination. She was thinking the sinemet  dose is high for the pt and would like trial of lower dose. She is also trying to get Nuplazid to replace seroquel . Since Nuplazid is non-formulary, she will try to contact his outpt neurology to request Nuplazid prescription.   We will change his sinemet  CR to 25-100 tid with meal and then 50-200 at bedtime. Continue seroquel  25 at bedtime at the meantime.   Consuelo Denmark, MD PhD Stroke Neurology 03/28/2024 6:34 PM

## 2024-03-29 ENCOUNTER — Ambulatory Visit: Payer: Medicare (Managed Care) | Admitting: Neurology

## 2024-03-29 ENCOUNTER — Telehealth: Payer: Self-pay | Admitting: Adult Health

## 2024-03-29 DIAGNOSIS — G20A1 Parkinson's disease without dyskinesia, without mention of fluctuations: Secondary | ICD-10-CM | POA: Diagnosis not present

## 2024-03-29 DIAGNOSIS — E785 Hyperlipidemia, unspecified: Secondary | ICD-10-CM | POA: Diagnosis not present

## 2024-03-29 DIAGNOSIS — G459 Transient cerebral ischemic attack, unspecified: Secondary | ICD-10-CM | POA: Diagnosis not present

## 2024-03-29 DIAGNOSIS — G9340 Encephalopathy, unspecified: Secondary | ICD-10-CM

## 2024-03-29 LAB — GLUCOSE, CAPILLARY
Glucose-Capillary: 124 mg/dL — ABNORMAL HIGH (ref 70–99)
Glucose-Capillary: 198 mg/dL — ABNORMAL HIGH (ref 70–99)
Glucose-Capillary: 228 mg/dL — ABNORMAL HIGH (ref 70–99)
Glucose-Capillary: 184 mg/dL — ABNORMAL HIGH (ref 70–99)

## 2024-03-29 NOTE — Telephone Encounter (Signed)
 Patient needs to be rescheduled with Dr. Janett Medin. Recent visit was cancelled as patient hospitalized.  I do not want to make any medication changes until he can be seen in the office and get Dr. Wynelle Heather opinion. Can patient please be rescheduled with Dr. Janett Medin? Possibly his work in slot at the end of June and also placed on his cancellation list?

## 2024-03-29 NOTE — Progress Notes (Signed)
 Speech Language Pathology Treatment: Cognitive-Linquistic  Patient Details Name: Jordan [name] MRN: [id number] DOB: October 10, 1941 Today's Date: 03/29/2024 Time: 4259-5638 SLP Time Calculation (min) (ACUTE ONLY): 12 min  Assessment / Plan / Recommendation Clinical Impression  Pt is progressing towards goals related to cognitive-linguistic function. His language still appears perseverative at times but he can now repeat at the sentence level and confrontation naming is [facility]. He does exhibit deficits related to orientation, sustained attention, and memory. Pt is oriented to self but was unsure of all the aspects of location or time. He had difficulty both storing and retrieving four novel words. Verbal reasoning tasks were an area of strength. Pt states he is unsure if performance today represents a change from baseline and no family was present to confirm. Recommend ongoing SLP f/u to target deficits listed above.    HPI HPI: Jordan [name] is an 83 yo male presenting [facility] ED on 5/21 due to R sided weakness and aphasia. MRI brain negative for acute stroke; recent hospitalization.   PMHx: Parkinsons, HLD, OSA, DM, HTN, chronic diastolic HF, CVA, frequent falls, L2 compression fx, L hip fx, metastatic neuroendocrine tumor      SLP Plan  Continue with current plan of care      Recommendations for follow up therapy are one component of a multi-disciplinary discharge planning process, led by the attending physician.  Recommendations may be updated based on patient status, additional functional criteria and insurance authorization.    Recommendations                     Oral care BID   Frequent or constant Supervision/Assistance Cognitive communication deficit (V56.433)     Continue with current plan of care     Amil Kale, M.A., CCC-SLP Speech Language Pathology, Acute Rehabilitation Services  Secure Chat preferred [phone]   03/29/2024, 4:45 PM

## 2024-03-29 NOTE — TOC Progression Note (Signed)
 Transition of Care [facility]) - Progression Note    Patient Details  Name: Jordan [name] MRN: [id number] Date of Birth: 11-13-1940  Transition [facility] Care [facility]) CM/SW Contact  Jonathan Neighbor, RN Phone Number: 03/29/2024, 3:57 PM  Clinical Narrative:     CM [facility] for up to 60 days. CM has updated [name] and they can accept him in the am. CM has updated the MD and pts spouse. CM will coordinate transportation once d/c placed in am.  TOC following.  Expected Discharge Plan: Skilled Nursing Facility Barriers to Discharge: Continued Medical Work up  Expected Discharge Plan and Services   Discharge Planning Services: CM Consult Post Acute Care Choice: IP Rehab Living arrangements for the past 2 months: Single Family Home                                       Social Determinants of Health (SDOH) Interventions SDOH Screenings   Food Insecurity: No Food Insecurity (03/22/2024)  Housing: Low Risk  (03/22/2024)  Transportation Needs: No Transportation Needs (03/22/2024)  Utilities: Not At Risk (03/22/2024)  Depression (PHQ2-9): Low Risk  (04/12/2023)  Social Connections: Socially Integrated (03/29/2024)  Tobacco Use: Medium Risk (03/21/2024)    Readmission Risk Interventions     No data to display

## 2024-03-29 NOTE — Progress Notes (Addendum)
 PROGRESS NOTE Jordan [name]  MVH:[id number] DOB: 1940-12-24 DOA: 03/21/2024 PCP: Roselind Congo, MD   Brief Narrative: 83 year old past medical history significant for CVA, diabetes type 2, chronic diastolic heart failure, arthritis, Parkinson's disease, CAD, DVT, gout, hypertension, hyperlipidemia, OSA on CPAP, metastatic neuroendocrine tumor presented with right-sided weakness and aphasia.  He was recently hospitalized from 4/26 until 5/2 for encephalopathy that was felt to be due to medication side effect of Seroquel .  CT and EEG were also unremarkable at that time. Seroquel  was adjusted to be given at 5 PM which helped with hallucinations, and agitation without oversedation.  This hospitalization he presented with aphasia and right-sided weakness. He was admitted for repeat stroke workup.  Thought to have had a TIA.  Symptoms were slowly improving upon admission.   Of note, had a repeat episode during hospitalization on 5/28 which again did not show signs of further stroke. Neurology evaluated. Also underwent EEG which was negative for stroke but showed moderate diffuse encephalopathy.   Medically stable for dc today when SNF is available. 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 -  CT head on admission showed remote infarct involving anterior left frontal lobe and remote lacunar infarct in the cerebellum bilaterally.  MRI brain was also negative for acute stroke. ECHO Ef 55 % - Continue with Xarelto  -5/28; noticed to have worsening strokelike symptoms, right facial droop worsening confusion.  CT head stat negative. Brain MRI negative.  Ultimately, medications likely contributing to fluctuating mental status and modifications were made to sinemet  as listed below.   2-Physical deconditioning: PT OT recommending SNF Awaiting insurance approval  Parkinson's disease: He probably has a component of underlying dementia in the setting of Parkinson disease Continue Sinemet  (changed dosing per neuro recs-  - 50-200 at bedtime  - 25-100 TID with meals - continue donepezil To avoid oversedation Seroquel  was changed to daily around 5 PM.  May have also be causing some dystonia with a twice a day dosing  History of DVT: Continue with Xarelto   History of CVA CT head showed remote infarct left frontal lobe and remote lacunar infarct in the cerebellum Lipitor and Xarelto   Essential hypertension: Orthostatic Hypotension.  Midodrine  for orthostatic hypotension - TEd hose and abdominal binder.   Paroxysmal  A-fib: Continue with Xarelto   Chronic diastolic heart failure (HCC) - No signs/symptoms of exacerbation -Last echo 01/06/2022 showed EF 60 to 65%, indeterminate diastolic parameters. Repeat this admission EF 55%.    Type 2 diabetes mellitus with hyperlipidemia (HCC) - A1c 6.4% - Continue SSI and CBG monitoring   HLD- LDL 55 - Continue Lipitor   Presence of permanent cardiac pacemaker - Due to history of sinus node dysfunction -Radiology note copied below for future reference "Device system confirmed to be MRI conditional, with implant date > 6 weeks ago, and no evidence of abandoned or epicardial leads in review of most recent CXR  Device last cleared by EP Provider:  [name] 03/21/24  Clearance is good through for 1 year as long as parameters remain stable at time of check. If pt undergoes a cardiac  device procedure during that time, they should be re-cleared.   Tachy-therapies to be programmed off if applicable with device back to pre-MRI settings after completion of exam. 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 (Active)  01/06/24 1130  Location: Coccyx  Location Orientation: Posterior  Staging: Stage 1 -  Intact skin with non-blanchable redness of a localized area usually over a bony prominence.  Wound Description (Comments):   Present on Admission:   Dressing Type Foam - Lift dressing to assess site every shift 03/27/24 0800    Estimated body mass index is 30.8 kg/m as calculated from the following:   Height as of 02/25/24: 5\' 9"  (1.753 m).   Weight as of this encounter: 94.6 kg.   DVT prophylaxis: Xarelto  Code Status: Full code Family Communication: none at bedside Disposition Plan:  Status is: Inpatient Remains inpatient appropriate because: dispo pending. Medically stable for dc  Consultants:  Neurology    Subjective: Patient reports no concerns. Answers questions appropriately. Appears to fall asleep occasionally during conversation with his eyes open but easily re-woken with gentle shoulder touch and again oriented and responding appropriately each time.   Objective: Vitals:   03/28/24 1022 03/28/24 1254 03/28/24 1732 03/28/24 2010  BP: (!) 137/97 (!) 155/112 (!) 174/93 (!) 157/79  Pulse: 74 67 71 66  Resp:  18 16 16   Temp: 99 F (37.2 C) 99  F (37.2 C) 98.8 F (37.1 C) 98.5 F (36.9 C)  TempSrc: Oral Axillary Oral Oral  SpO2: 94%  97% 93%  Weight:        Intake/Output Summary (Last 24 hours) at 03/29/2024 0728 Last data filed at 03/29/2024 1610 Gross per 24 hour  Intake 1194.85 ml  Output --  Net 1194.85 ml   Filed Weights   03/21/24 1700  Weight: 94.6 kg   Examination:  General exam: Appears calm and comfortable  Respiratory system: Clear to auscultation. Respiratory effort normal. Cardiovascular system: S1 & S2 heard, RRR. No JVD, murmurs, rubs, gallops or clicks. No pedal edema. Central nervous system: Alert to voice. No focal abnormalities.   Data Reviewed: I have personally reviewed following labs and imaging studies  CBC: Recent Labs  Lab 03/23/24 1051 03/28/24 1046  WBC 7.9 9.5  NEUTROABS 4.6  --   HGB 12.5* 12.5*  HCT 38.2* 39.5  MCV 85.1 86.6  PLT 225 254   Basic Metabolic Panel: Recent Labs  Lab 03/23/24 1051 03/28/24 1046  NA 140 141  K 3.5 4.3  CL 105 103  CO2 26 26  GLUCOSE 154* 165*  BUN 7* 13  CREATININE 0.77 1.01  CALCIUM  9.2 10.1  MG 1.6*  --    Radiology Studies: MR BRAIN WO CONTRAST Result Date: 03/28/2024 EXAM: MRI BRAIN WITHOUT CONTRAST 03/28/2024 02:22:27 PM TECHNIQUE: Multiplanar multisequence MRI of the head/brain was performed without  the administration of intravenous contrast. The study was discontinued early as the patient was unable to hold still for further imaging. Only diffusion-weighted images and axial FLAIR images were obtained. COMPARISON: CT head without contrast 03/28/2024. MR head without contrast 03/22/2024. CLINICAL HISTORY: Facial paralysis/weakness (CN 7). R sided weakness and aphasia. FINDINGS: BRAIN AND VENTRICLES: No acute infarct. No intracranial hemorrhage. No mass. No midline shift. No hydrocephalus. The sella is unremarkable. Normal flow voids. Chronic encephalomalacia of the anterior left frontal lobe is stable. A remote medial right occipital lobe  infarct is stable. Periventricular white matter T2 hyperintensities are stable, mild. ORBITS: No acute abnormality. SINUSES AND MASTOIDS: No acute abnormality. BONES AND SOFT TISSUES: Normal marrow signal. No acute soft tissue abnormality. The study is moderately degraded by patient motion. IMPRESSION: 1. No acute intracranial abnormality. 2. Stable chronic encephalomalacia of the anterior left frontal lobe and remote medial right occipital lobe infarct. 3. Stable, mild periventricular white matter T2 hyperintensities. 4. Study limited by patient motion and early termination. Electronically signed by: Audree Leas MD 03/28/2024 06:33 PM EDT RP Workstation: [id number]   EEG adult Result Date: 03/28/2024 Arleene Lack, MD     03/28/2024  4:20 PM Patient Name: Jordan [name] MRN: [id number] Epilepsy Attending: Arleene Lack Referring Physician/Provider: Consuelo Denmark, MD Date: 03/28/2024 Duration: 24.08 mins Patient history: 83yo m with ams. EEG to evaluate for seizure. Level of alertness: comatose/ lethargic AEDs during EEG study: None Technical aspects: This EEG study was done with scalp electrodes positioned according to the 10-20 International system of electrode placement. Electrical activity was reviewed with band pass filter of 1-70Hz , sensitivity of 7 uV/mm, display speed of 25mm/sec with a 60Hz  notched filter applied as appropriate. EEG data were recorded continuously and digitally stored.  Video monitoring was available and reviewed as appropriate. Description: EEG showed continuous generalized 3 to 6 Hz theta-delta slowing. Hyperventilation and photic stimulation were not performed.   ABNORMALITY - Continuous slow, generalized IMPRESSION: This study is suggestive of moderate diffuse encephalopathy. No seizures or epileptiform discharges were seen throughout the recording. Priyanka Suzanne Erps   CT HEAD WO CONTRAST ( ) Result Date: 03/28/2024 CLINICAL DATA:  Altered mental status. EXAM: CT HEAD  WITHOUT CONTRAST TECHNIQUE: Contiguous axial images were obtained from the base of the skull through the vertex without intravenous contrast. RADIATION DOSE REDUCTION: This exam was performed according to the departmental dose-optimization program which includes automated exposure control, adjustment of the mA and/or kV according to patient size and/or use of iterative reconstruction technique. COMPARISON:  MRI head 03/22/2024. CT head and CTA head and neck 03/21/2024. FINDINGS: Brain: No acute intracranial hemorrhage. No CT evidence of acute infarct. Nonspecific hypoattenuation in the periventricular and subcortical white matter favored to reflect chronic microvascular ischemic changes. Redemonstrated remote infarcts in the left frontal lobe, left thalamus, and bilateral cerebellum. No edema, mass effect, or midline shift. The basilar cisterns are patent. Ventricles: Prominence of the ventricles suggesting underlying parenchymal volume loss. Vascular: Atherosclerotic calcifications of the carotid siphons and intracranial vertebral arteries. No hyperdense vessel. Skull: No acute or aggressive finding. Orbits: Orbits are symmetric. Sinuses: Mucosal thickening completely opacifying the left frontal sinus with additional scattered mucosal thickening in the ethmoid sinuses and air-fluid level in the left maxillary sinus. Other: Mastoid air cells are clear. IMPRESSION: No CT evidence of acute intracranial abnormality. Chronic microvascular ischemic changes. Moderate parenchymal volume loss. Remote infarcts as above. Paranasal sinus disease with air-fluid level in the left maxillary sinus concerning for acute sinusitis.  Electronically Signed   By: Denny Flack M.D.   On: 03/28/2024 13:21   Scheduled Meds:  atorvastatin   20 mg Oral QHS   carbidopa -levodopa   1 tablet Oral QHS   Carbidopa -Levodopa  ER  1 tablet Oral TID WC   donepezil   5 mg Oral Daily   insulin  aspart  0-6 Units Subcutaneous TID WC   melatonin  10 mg  Oral QHS   midodrine   5 mg Oral BID WC   modafinil   200 mg Oral Daily   QUEtiapine   25 mg Oral QPM   rivaroxaban   20 mg Oral Daily   sodium chloride  flush  3 mL Intravenous Q12H   tamsulosin   0.4 mg Oral QHS   Continuous Infusions:  lactated ringers  75 mL/hr at 03/29/24 0639     LOS: 8 days    Time spent: 45 minutes    Ree Candy, MD [facility]   If 7PM-7AM, please contact night-coverage [email or web address]  03/29/2024, 7:28 AM

## 2024-03-29 NOTE — Progress Notes (Signed)
 STROKE TEAM PROGRESS NOTE   SUBJECTIVE (INTERVAL HISTORY) No family is at the bedside. Pt is lying in bed, eyes open, orientated x 2, and answering questions and much more awake alert than yesterday. MRI negative. No LUE weakness anymore.    OBJECTIVE Temp:  [97.7 F (36.5 C)-98.6 F (37 C)] 98 F (36.7 C) (05/29 1455) Pulse Rate:  [60-67] 60 (05/29 1455) Cardiac Rhythm: Atrial paced (05/29 0720) Resp:  [16-19] 19 (05/29 1455) BP: (107-157)/(75-80) 146/79 (05/29 1455) SpO2:  [93 %-100 %] 100 % (05/29 1455)  Recent Labs  Lab 03/28/24 1728 03/28/24 2118 03/29/24 0620 03/29/24 1208 03/29/24 1458  GLUCAP 132* 208* 124* 198* 228*   Recent Labs  Lab 03/23/24 1051 03/28/24 1046  NA 140 141  K 3.5 4.3  CL 105 103  CO2 26 26  GLUCOSE 154* 165*  BUN 7* 13  CREATININE 0.77 1.01  CALCIUM  9.2 10.1  MG 1.6*  --    No results for input(s): "AST", "ALT", "ALKPHOS", "BILITOT", "PROT", "ALBUMIN" in the last 168 hours.  Recent Labs  Lab 03/23/24 1051 03/28/24 1046  WBC 7.9 9.5  NEUTROABS 4.6  --   HGB 12.5* 12.5*  HCT 38.2* 39.5  MCV 85.1 86.6  PLT 225 254   No results for input(s): "CKTOTAL", "CKMB", "CKMBINDEX", "TROPONINI" in the last 168 hours. No results for input(s): "LABPROT", "INR" in the last 72 hours. No results for input(s): "COLORURINE", "LABSPEC", "PHURINE", "GLUCOSEU", "HGBUR", "BILIRUBINUR", "KETONESUR", "PROTEINUR", "UROBILINOGEN", "NITRITE", "LEUKOCYTESUR" in the last 72 hours.  Invalid input(s): "APPERANCEUR"     Component Value Date/Time   CHOL 114 03/22/2024 0500   TRIG 99 03/22/2024 0500   HDL 39 (L) 03/22/2024 0500   CHOLHDL 2.9 03/22/2024 0500   VLDL 20 03/22/2024 0500   LDLCALC 55 03/22/2024 0500   Lab Results  Component Value Date   HGBA1C 6.4 (H) 03/22/2024      Component Value Date/Time   LABOPIA NONE DETECTED 03/21/2024 2100   COCAINSCRNUR NONE DETECTED 03/21/2024 2100   LABBENZ NONE DETECTED 03/21/2024 2100   AMPHETMU NONE DETECTED  03/21/2024 2100   THCU NONE DETECTED 03/21/2024 2100   LABBARB NONE DETECTED 03/21/2024 2100    No results for input(s): "ETH" in the last 168 hours.   I have personally reviewed the radiological images below and agree with the radiology interpretations.  MR BRAIN WO CONTRAST Result Date: 03/28/2024 EXAM: MRI BRAIN WITHOUT CONTRAST 03/28/2024 02:22:27 PM TECHNIQUE: Multiplanar multisequence MRI of the head/brain was performed without the administration of intravenous contrast. The study was discontinued early as the patient was unable to hold still for further imaging. Only diffusion-weighted images and axial FLAIR images were obtained. COMPARISON: CT head without contrast 03/28/2024. MR head without contrast 03/22/2024. CLINICAL HISTORY: Facial paralysis/weakness (CN 7). R sided weakness and aphasia. FINDINGS: BRAIN AND VENTRICLES: No acute infarct. No intracranial hemorrhage. No mass. No midline shift. No hydrocephalus. The sella is unremarkable. Normal flow voids. Chronic encephalomalacia of the anterior left frontal lobe is stable. A remote medial right occipital lobe infarct is stable. Periventricular white matter T2 hyperintensities are stable, mild. ORBITS: No acute abnormality. SINUSES AND MASTOIDS: No acute abnormality. BONES AND SOFT TISSUES: Normal marrow signal. No acute soft tissue abnormality. The study is moderately degraded by patient motion. IMPRESSION: 1. No acute intracranial abnormality. 2. Stable chronic encephalomalacia of the anterior left frontal lobe and remote medial right occipital lobe infarct. 3. Stable, mild periventricular white matter T2 hyperintensities. 4. Study limited by patient motion and  early termination. Electronically signed by: Audree Leas MD 03/28/2024 06:33 PM EDT RP Workstation: [id number]   EEG adult Result Date: 03/28/2024 Arleene Lack, MD     03/28/2024  4:20 PM Patient Name: Jordan [name] MRN: [id number] Epilepsy Attending: Arleene Lack  Referring Physician/Provider: Consuelo Denmark, MD Date: 03/28/2024 Duration: 24.08 mins Patient history: 83yo m with ams. EEG to evaluate for seizure. Level of alertness: comatose/ lethargic AEDs during EEG study: None Technical aspects: This EEG study was done with scalp electrodes positioned according to the 10-20 International system of electrode placement. Electrical activity was reviewed with band pass filter of 1-70Hz , sensitivity of 7 uV/mm, display speed of 92mm/sec with a 60Hz  notched filter applied as appropriate. EEG data were recorded continuously and digitally stored.  Video monitoring was available and reviewed as appropriate. Description: EEG showed continuous generalized 3 to 6 Hz theta-delta slowing. Hyperventilation and photic stimulation were not performed.   ABNORMALITY - Continuous slow, generalized IMPRESSION: This study is suggestive of moderate diffuse encephalopathy. No seizures or epileptiform discharges were seen throughout the recording. Priyanka Suzanne Erps   CT HEAD WO CONTRAST ( ) Result Date: 03/28/2024 CLINICAL DATA:  Altered mental status. EXAM: CT HEAD WITHOUT CONTRAST TECHNIQUE: Contiguous axial images were obtained from the base of the skull through the vertex without intravenous contrast. RADIATION DOSE REDUCTION: This exam was performed according to the departmental dose-optimization program which includes automated exposure control, adjustment of the mA and/or kV according to patient size and/or use of iterative reconstruction technique. COMPARISON:  MRI head 03/22/2024. CT head and CTA head and neck 03/21/2024. FINDINGS: Brain: No acute intracranial hemorrhage. No CT evidence of acute infarct. Nonspecific hypoattenuation in the periventricular and subcortical white matter favored to reflect chronic microvascular ischemic changes. Redemonstrated remote infarcts in the left frontal lobe, left thalamus, and bilateral cerebellum. No edema, mass effect, or midline shift. The basilar  cisterns are patent. Ventricles: Prominence of the ventricles suggesting underlying parenchymal volume loss. Vascular: Atherosclerotic calcifications of the carotid siphons and intracranial vertebral arteries. No hyperdense vessel. Skull: No acute or aggressive finding. Orbits: Orbits are symmetric. Sinuses: Mucosal thickening completely opacifying the left frontal sinus with additional scattered mucosal thickening in the ethmoid sinuses and air-fluid level in the left maxillary sinus. Other: Mastoid air cells are clear. IMPRESSION: No CT evidence of acute intracranial abnormality. Chronic microvascular ischemic changes. Moderate parenchymal volume loss. Remote infarcts as above. Paranasal sinus disease with air-fluid level in the left maxillary sinus concerning for acute sinusitis. Electronically Signed   By: Denny Flack M.D.   On: 03/28/2024 13:21   ECHOCARDIOGRAM COMPLETE Result Date: 03/22/2024    ECHOCARDIOGRAM REPORT   Patient Name:   Jordan [name] Date of Exam: 03/22/2024 Medical Rec #:  [id number]       Height:       69.0 in Accession #:    [id number]      Weight:       208.6 lb Date of Birth:  [birth year]       BSA:          2.103 m Patient Age:    83 years        BP:           133/87 mmHg Patient Gender: M               HR:           68 bpm. Exam Location:  Inpatient Procedure: 2D Echo, Cardiac Doppler, Color Doppler, Intracardiac  Opacification            Agent and Saline Contrast Bubble Study (Both Spectral and Color Flow            Doppler were utilized during procedure). Indications:    Stroke  History:        Patient has prior history of Echocardiogram examinations, most                 recent 01/06/2022. Signs/Symptoms:Shortness of Breath; Risk                 Factors:Sleep Apnea, Dyslipidemia, Hypertension and Diabetes.                 DVT, DOE.  Sonographer:    Juanita Shaw Referring Phys: [id number] DEVON SHAFER IMPRESSIONS  1. Left ventricular ejection fraction, by estimation, is 55 to 60%. The  left ventricle has normal function. Left ventricular endocardial border not optimally defined to evaluate regional wall motion. Left ventricular diastolic parameters are indeterminate.  2. Right ventricular systolic function is normal. The right ventricular size is normal. There is normal pulmonary artery systolic pressure.  3. The mitral valve was not well visualized. No evidence of mitral valve regurgitation. No evidence of mitral stenosis.  4. The aortic valve was not well visualized. There is moderate calcification of the aortic valve. Aortic valve regurgitation is not visualized. No aortic stenosis is present.  5. The inferior vena cava is normal in size with greater than 50% respiratory variability, suggesting right atrial pressure of 3 mmHg. FINDINGS  Left Ventricle: Left ventricular ejection fraction, by estimation, is 55 to 60%. The left ventricle has normal function. Left ventricular endocardial border not optimally defined to evaluate regional wall motion. The left ventricular internal cavity size was normal in size. There is no left ventricular hypertrophy. Left ventricular diastolic function could not be evaluated due to atrial fibrillation. Left ventricular diastolic parameters are indeterminate. Right Ventricle: The right ventricular size is normal. No increase in right ventricular wall thickness. Right ventricular systolic function is normal. There is normal pulmonary artery systolic pressure. The tricuspid regurgitant velocity is 2.02 m/s, and  with an assumed right atrial pressure of 3 mmHg, the estimated right ventricular systolic pressure is 19.3 mmHg. Left Atrium: Left atrial size was normal in size. Right Atrium: Right atrial size was normal in size. Pericardium: There is no evidence of pericardial effusion. Mitral Valve: The mitral valve was not well visualized. Mild mitral annular calcification. No evidence of mitral valve regurgitation. No evidence of mitral valve stenosis. MV peak gradient,  4.9 mmHg. The mean mitral valve gradient is 2.0 mmHg. Tricuspid Valve: The tricuspid valve is not well visualized. Tricuspid valve regurgitation is not demonstrated. No evidence of tricuspid stenosis. Aortic Valve: The aortic valve was not well visualized. There is moderate calcification of the aortic valve. There is moderate aortic valve annular calcification. Aortic valve regurgitation is not visualized. No aortic stenosis is present. Aortic valve mean gradient measures 2.0 mmHg. Aortic valve peak gradient measures 4.8 mmHg. Aortic valve area, by VTI measures 3.30 cm. Pulmonic Valve: The pulmonic valve was not well visualized. Pulmonic valve regurgitation is not visualized. No evidence of pulmonic stenosis. Aorta: The aortic root is normal in size and structure. Venous: The inferior vena cava is normal in size with greater than 50% respiratory variability, suggesting right atrial pressure of 3 mmHg. IAS/Shunts: No atrial level shunt detected by color flow Doppler.  LEFT VENTRICLE PLAX 2D LVIDd:  4.90 cm      Diastology LVIDs:         3.30 cm      LV e' medial:    5.44 cm/s LV PW:         0.90 cm      LV E/e' medial:  12.8 LV IVS:        0.90 cm      LV e' lateral:   4.51 cm/s LVOT diam:     1.80 cm      LV E/e' lateral: 15.5 LV SV:         52 LV SV Index:   25 LVOT Area:     2.54 cm  LV Volumes (MOD) LV vol d, MOD A2C: 185.0 ml LV vol d, MOD A4C: 154.0 ml LV vol s, MOD A2C: 48.6 ml LV vol s, MOD A4C: 52.7 ml LV SV MOD A2C:     136.4 ml LV SV MOD A4C:     154.0 ml LV SV MOD BP:      114.2 ml RIGHT VENTRICLE             IVC RV Basal diam:  3.50 cm     IVC diam: 1.50 cm RV Mid diam:    2.70 cm RV S prime:     11.30 cm/s LEFT ATRIUM             Index        RIGHT ATRIUM          Index LA diam:        3.70 cm 1.76 cm/m   RA Area:     6.97 cm LA Vol (A2C):   21.1 ml 10.03 ml/m  RA Volume:   7.65 ml  3.64 ml/m LA Vol (A4C):   57.2 ml 27.20 ml/m LA Biplane Vol: 36.6 ml 17.40 ml/m  AORTIC VALVE                     PULMONIC VALVE AV Area (Vmax):    2.47 cm     PV Vmax:       0.60 m/s AV Area (Vmean):   2.50 cm     PV Peak grad:  1.5 mmHg AV Area (VTI):     3.30 cm AV Vmax:           109.00 cm/s AV Vmean:          66.900 cm/s AV VTI:            0.159 m AV Peak Grad:      4.8 mmHg AV Mean Grad:      2.0 mmHg LVOT Vmax:         106.00 cm/s LVOT Vmean:        65.800 cm/s LVOT VTI:          0.206 m LVOT/AV VTI ratio: 1.30  AORTA Ao Root diam: 2.90 cm Ao Asc diam:  2.80 cm MITRAL VALVE                TRICUSPID VALVE MV Area (PHT): 2.10 cm     TR Peak grad:   16.3 mmHg MV Area VTI:   2.09 cm     TR Vmax:        202.00 cm/s MV Peak grad:  4.9 mmHg MV Mean grad:  2.0 mmHg     SHUNTS MV Vmax:       1.11 m/s     Systemic VTI:  0.21  m MV Vmean:      68.0 cm/s    Systemic Diam: 1.80 cm MV Decel Time: 362 msec MV E velocity: 69.80 cm/s MV A velocity: 105.00 cm/s MV E/A ratio:  0.66 Aditya Sabharwal Electronically signed by Alwin Baars Signature Date/Time: 03/22/2024/5:14:37 PM    Final    MR BRAIN WO CONTRAST Result Date: 03/22/2024 CLINICAL DATA:  Stroke follow-up. History of metastatic neuroendocrine tumor. EXAM: MRI HEAD WITHOUT CONTRAST TECHNIQUE: Multiplanar, multiecho pulse sequences of the brain and surrounding structures were obtained without intravenous contrast. COMPARISON:  Head CT from yesterday FINDINGS: Brain: No acute infarction, hemorrhage, hydrocephalus, extra-axial collection or mass lesion. Small chronic infarcts scattered in the bilateral cerebellum. Chronic lacunar infarcts in the bilateral thalamus and deep white matter. Encephalomalacia in the anterior left frontal lobe adjacent to a calvarial defect, presumably from remote trauma. Generalized brain atrophy. Chronic small vessel ischemia in the cerebral white matter. Chronic right occipital cortex infarct. Vascular: Major flow voids are preserved Skull and upper cervical spine: Normal marrow signal Sinuses/Orbits: Bilateral cataract resection.  Complete opacification of the left frontal sinus in proximity to the described skull deformity. In general sinus inflammation has improved from 12/22/2023 head CT. IMPRESSION: No acute or reversible finding. Brain atrophy and small chronic infarcts as described. Electronically Signed   By: Ronnette Coke M.D.   On: 03/22/2024 09:25   EEG adult Result Date: 03/22/2024 Arleene Lack, MD     03/22/2024  8:00 AM Patient Name: Jordan [name] MRN: [id number] Epilepsy Attending: Arleene Lack Referring Physician/Provider: Imogene Mana, NP Date: 03/21/2024 Duration: 22.44 mins Patient history: 83yo M with left gaze preference and right-sided weakness. EEG to evaluate for seizure Level of alertness: Awake AEDs during EEG study: None Technical aspects: This EEG study was done with scalp electrodes positioned according to the 10-20 International system of electrode placement. Electrical activity was reviewed with band pass filter of 1-70Hz , sensitivity of 7 uV/mm, display speed of 33mm/sec with a 60Hz  notched filter applied as appropriate. EEG data were recorded continuously and digitally stored.  Video monitoring was available and reviewed as appropriate. Description: The posterior dominant rhythm consists of 7 Hz activity of moderate voltage (25-35 uV) seen predominantly in posterior head regions, symmetric and reactive to eye opening and eye closing. EEG showed continuous generalized predominantly 5 to 6 Hz theta slowing admixed with intermittent 2-3hz  delta slowing.  Hyperventilation and photic stimulation were not performed.   ABNORMALITY - Continuous slow, generalized IMPRESSION: This study is suggestive of moderate diffuse encephalopathy. No seizures or epileptiform discharges were seen throughout the recording. Priyanka Suzanne Erps   CT ANGIO HEAD NECK W WO CM (CODE STROKE) Result Date: 03/21/2024 CLINICAL DATA:  Neuro deficit, concern for stroke, right-sided weakness, slurred speech. EXAM: CT ANGIOGRAPHY HEAD  AND NECK WITH AND WITHOUT CONTRAST TECHNIQUE: Multidetector CT imaging of the head and neck was performed using the standard protocol during bolus administration of intravenous contrast. Multiplanar CT image reconstructions and MIPs were obtained to evaluate the vascular anatomy. Carotid stenosis measurements (when applicable) are obtained utilizing NASCET criteria, using the distal internal carotid diameter as the denominator. RADIATION DOSE REDUCTION: This exam was performed according to the departmental dose-optimization program which includes automated exposure control, adjustment of the mA and/or kV according to patient size and/or use of iterative reconstruction technique. CONTRAST:  75mL OMNIPAQUE  IOHEXOL  350 MG/ML SOLN COMPARISON:  CT head earlier same day. FINDINGS: CTA NECK FINDINGS Aortic arch: Standard configuration of the aortic arch. Imaged portion  shows no evidence of aneurysm or dissection. Mild atherosclerosis along the aortic arch and visualized proximal descending thoracic aorta. No significant stenosis of the major arch vessel origins. Pulmonary arteries: As permitted by contrast timing, there are no filling defects in the visualized pulmonary arteries. Subclavian arteries: The subclavian arteries are patent bilaterally. Right carotid system: Atherosclerosis noted along the distal brachiocephalic artery without significant narrowing at the origin of the right common carotid artery. Right carotid artery is patent from the origin to the skull base. Calcified atherosclerosis at the carotid bifurcation resulting in approximately 30% stenosis. Left carotid system: Patent from the origin to the skull base. Calcified atherosclerosis at the carotid bifurcation without hemodynamically significant stenosis. Vertebral arteries: Codominant. No evidence of dissection, stenosis (50% or greater), or occlusion. Atherosclerosis of the bilateral V4 segments resulting in mild stenosis. Skeleton: No acute findings.  Degenerative changes in the cervical spine. Disc space narrowing most pronounced at C4-5 and C5-6. Other neck: The visualized airway is patent. No cervical lymphadenopathy. Upper chest: Atelectasis in the dependent aspect of both lungs. Partially visualized left chest wall pacer device. Review of the MIP images confirms the above findings CTA HEAD FINDINGS ANTERIOR CIRCULATION: The intracranial ICAs are patent bilaterally. Atherosclerosis of the bilateral carotid siphons. Mild stenosis of the right supraclinoid ICA. No significant stenosis, proximal occlusion, aneurysm, or vascular malformation. MCAs: The middle cerebral arteries are patent bilaterally. ACAs: The anterior cerebral arteries are patent bilaterally. POSTERIOR CIRCULATION: No significant stenosis, proximal occlusion, aneurysm, or vascular malformation. PCAs: Patent bilaterally. Focal severe stenosis of the P2 segment right PCA Pcomm: Not well visualized. SCAs: The superior cerebellar arteries are patent bilaterally. Basilar artery: Patent AICAs: Visualized on the left. PICAs: Visualized on the right. Vertebral arteries: The intracranial vertebral arteries are patent. Venous sinuses: As permitted by contrast timing, patent. Anatomic variants: None Review of the MIP images confirms the above findings IMPRESSION: No large vessel occlusion. Focal severe stenosis of the P2 segment right PCA. Otherwise no high-grade stenosis identified. Atherosclerosis at the carotid bifurcations. 30% stenosis at the origin of the right cervical ICA. Mild stenosis of the right supraclinoid ICA. Additional mild stenosis of the V4 segments of the bilateral vertebral arteries. Aortic Atherosclerosis (ICD10-I70.0). Electronically Signed   By: Denny Flack M.D.   On: 03/21/2024 18:54   CT HEAD CODE STROKE WO CONTRAST Result Date: 03/21/2024 EXAM: CT HEAD WITHOUT 03/21/2024 05:33:57 PM TECHNIQUE: CT of the head was performed without the administration of intravenous contrast.  Automated exposure control, iterative reconstruction, and/or weight based adjustment of the mA/kV was utilized to reduce the radiation dose to as low as reasonably achievable. COMPARISON: CT head without contrast 02/25/2024. CLINICAL HISTORY: Neuro deficit, acute, stroke suspected. R/S weakness, slurred speech. FINDINGS: BRAIN AND VENTRICLES: Remote infarct is noted in the anterior left frontal lobe. Remote lacunar infarcts are present in the cerebellum bilaterally. No acute infarct, hemorrhage, or mass lesion is present. Moderate atrophy and white matter changes bilaterally are stable. The ventricles are proportionate to the degree of atrophy. ASPECTS: . Ganglionic level: 7/7 Supraganglionic level:3/3. Total: 10/10 . Aaron Aas ORBITS: Lens replacements are noted bilaterally. The globes and orbits are otherwise within normal limits. SINUSES: A posterior right ethmoid air cell is opacified. Anterior left ethmoid air cells and the left frontal sinus are opacified. Chronic wall thickening is noted in the left maxillary sinus. Minimal secretions are present in the left maxillary sinus. No fluid level is present. SOFT TISSUES AND SKULL: No acute abnormality of the visualized skull or  soft tissues. VASCULATURE: Atherosclerotic calcifications are present within the cavernous internal carotid arteries bilaterally. No hyperdense vessel is present. IMPRESSION: 1. No acute intracranial abnormality. 2. Remote infarct in the anterior left frontal lobe and remote lacunar infarcts in the cerebellum bilaterally. 3. Stable moderate atrophy and white matter changes bilaterally. Key findings were texted via amion to Dr. Cleone Dad at 5:51pm Electronically signed by: Audree Leas MD 03/21/2024 05:52 PM EDT RP Workstation: [id number]     PHYSICAL EXAM  Temp:  [97.7 F (36.5 C)-98.6 F (37 C)] 98 F (36.7 C) (05/29 1455) Pulse Rate:  [60-67] 60 (05/29 1455) Resp:  [16-19] 19 (05/29 1455) BP: (107-157)/(75-80) 146/79 (05/29  1455) SpO2:  [93 %-100 %] 100 % (05/29 1455)  General - Well nourished, well developed, in no apparent distress   Ophthalmologic - fundi not visualized due to noncooperation.  Cardiovascular - Regular rhythm and rate.  Neuro - awake, alert, eyes open, orientated to age, place and people but told me 2005 and June for the time. No aphasia but paucity of speech but following all simple commands. Able to name 4/4 and repeat simple sentences in mildly dysarthric voice. No gaze palsy, tracking bilaterally, visual field full, PERRL. No facial droop. Tongue midline. Bilateral UEs 4/5, no drift. Bilaterally LEs 3/5. Sensation symmetrical bilaterally subjectively, b/l FTN intact but slow, gait not tested.     ASSESSMENT/PLAN Jordan [name] is a 83 y.o. male with history of Parkinson's disease, s/p pacemaker CHF, CAD, history of DVT on Xarelto , gout, hyperlipidemia, hypertension, narcolepsy, OSA on CPAP, stroke, diabetes admitted for right-sided weakness and aphasia. MRI negative. But on 5/28 found to have right facial droop and left LE weakness, neuro called back    AMS, improved CT stat no acute finding MRI repeat no acute infarct Pacemaker interrogation showed no A-fib EEG no seizure but moderate diffuse encephalopathy Mental status much improved this morning, AO x 3 Discussed with wife yesterday, decreased Sinemet  CR to 1 tablet 3 times daily and then 2 tablet at bedside.  Left hemispheric TIA 5/21 Code Stroke  CT head No acute abnormality.   Remote infarct in the anterior left frontal lobe and remote lacunar infarcts in the cerebellum bilaterally Small vessel disease. Atrophy.  ASPECTS 10.     CTA head & neck no LVO. Focal severe stenosis of the P2 segment right PCA. Otherwise no high-grade stenosis identified. MRI no acute process 2D Echo EF 55 to 60%.  Left atrial size normal. LDL 55 HgbA1c 6.8 VTE prophylaxis -Xarelto  Xarelto  (rivaroxaban ) daily prior to admission, now on Xarelto   (rivaroxaban ) daily Therapy recommendations: SNF Disposition: Pending   Hx of Stroke/TIA Parkinson's disease Remote infarct in the anterior left frontal lobe and remote lacunar infarcts in the cerebellum bilaterally  On Sinemet , Provigil , Seroquel  at night   History of DVT LE venous Doppler in 04/2022 found left lower extremity DVT on Xarelto  since   History of orthostatic hypotension Home meds: Midodrine  5 mg Stable Long-term BP goal normotensive   Hyperlipidemia Home meds: Atorvastatin  20 mg, resumed in hospital LDL 55, goal < 70 Continue statin at discharge   Diabetes type II Controlled Home meds: Metformin  HgbA1c 6.8, goal < 7.0 CBGs SSI Recommend close follow-up with PCP for better DM control   Other Stroke Risk Factors Obesity, Body mass index is 30.8 kg/m., BMI >/= 30 associated with increased stroke risk, recommend weight loss, diet and exercise as appropriate  Coronary artery disease Congestive heart failure Obstructive sleep apnea, on CPAP at  home    Other Active Problems Metastatic neuroendocrine tumor  Hospital day # 8  Neurology will sign off. Please call with questions. Pt will follow up with stroke clinic NP chest, at [facility] in about 4 weeks. Thanks for the consult.   Consuelo Denmark, MD PhD Stroke Neurology 03/29/2024 6:19 PM    To contact Stroke Continuity provider, please refer to [email or web address]. After hours, contact General Neurology

## 2024-03-29 NOTE — Progress Notes (Signed)
 Physical Therapy Treatment Patient Details Name: Jordan [name] MRN: [id number] DOB: [birth year] Today's Date: 03/29/2024   History of Present Illness Pt is 83 yo presenting to [facility] ED on 5/21 due to R sided weakness and aphasia; MRI brain also negative for acute stroke; recent hospitalization.   PMHx: Parkinsons, HLD, OSA, DM, HTN, chronic diastolic HF, CVA, frequent falls, L2 compression fx, L hip fx, metastatic neuroendocrine tumor.    PT Comments  Pt is very slowly progressing towards goals. Currently pt is Mod A for bed mobility, 2 person Min A for sit to stand and 2 person Mod A for stand pivot transfer. Pt has difficulty with motor planning/coordination in order to fully participate in transfer with significant difficulty progressing feet for transfer. Due to pt current functional status, home set up and available assistance at home recommending skilled physical therapy services < 3 hours/day in order to address strength, balance and functional mobility to decrease risk for falls, injury, immobility, skin break down and re-hospitalization.      If plan is discharge home, recommend the following: Two people to help with walking and/or transfers;Assistance with cooking/housework;Assist for transportation;Supervision due to cognitive status   Can travel by private vehicle     No  Equipment Recommendations  None recommended by PT       Precautions / Restrictions Precautions Precautions: Fall Recall of Precautions/Restrictions: Impaired Precaution/Restrictions Comments: watch BP Required Braces or Orthoses: Other Brace Other Brace: TED hose and Abdominal binder Restrictions Weight Bearing Restrictions Per Provider Order: No     Mobility  Bed Mobility Overal bed mobility: Needs Assistance Bed Mobility: Supine to Sit     Supine to sit: Mod assist, Used rails     General bed mobility comments: Mod A Pt with improved Legs to EOB today and able to help push up to mid line with multi  modal cues and encouragement.    Transfers Overall transfer level: Needs assistance Equipment used: 2 person hand held assist Transfers: Bed to chair/wheelchair/BSC, Sit to/from Stand Sit to Stand: Min assist, +2 physical assistance, +2 safety/equipment Stand pivot transfers: +2 physical assistance, Mod assist, +2 safety/equipment         General transfer comment: PT and tech performing face to face stand pivot transfer. Pt able to get to recliner.    Ambulation/Gait     General Gait Details: Deferred due to pt current functional status and hypotension      Balance Overall balance assessment: Needs assistance Sitting-balance support: Feet supported, Bilateral upper extremity supported Sitting balance-Leahy Scale: Poor Sitting balance - Comments: Min A/CGA to close SBA with hand placement.   Standing balance support: Bilateral upper extremity supported Standing balance-Leahy Scale: Poor Standing balance comment: requires external support        Communication Communication Communication: Impaired Factors Affecting Communication: Hearing impaired;Difficulty expressing self  Cognition Arousal: Alert, Lethargic Behavior During Therapy: Flat affect   PT - Cognitive impairments: Memory, Attention, Sequencing, Problem solving, Safety/Judgement, Awareness, Initiation   Orientation impairments: Place, Situation     PT - Cognition Comments: slow to initiate with A to move legs to EOB, increased time for processing Following commands: Impaired Following commands impaired: Follows one step commands with increased time, Follows one step commands inconsistently    Cueing Cueing Techniques: Verbal cues, Gestural cues, Tactile cues     General Comments General comments (skin integrity, edema, etc.): Supine 110/62, Sitting 110/54, Standing 88/59.      Pertinent Vitals/Pain Pain Assessment Pain Assessment: Faces Faces Pain  Scale: No hurt Breathing: normal Negative  Vocalization: none Facial Expression: smiling or inexpressive Body Language: relaxed Consolability: no need to console PAINAD Score: 0 Pain Intervention(s): Monitored during session    Home Living Family/patient expects to be discharged to:: Skilled nursing facility Living Arrangements: Spouse/significant other;Children         PT Goals (current goals can now be found in the care plan section) Acute Rehab PT Goals Patient Stated Goal: none stated PT Goal Formulation: Patient unable to participate in goal setting Time For Goal Achievement: 04/05/24 Potential to Achieve Goals: Fair Progress towards PT goals: Progressing toward goals    Frequency    Min 2X/week      PT Plan  Continue with current POC        AM-PAC PT "6 Clicks" Mobility   Outcome Measure  Help needed turning from your back to your side while in a flat bed without using bedrails?: A Lot Help needed moving from lying on your back to sitting on the side of a flat bed without using bedrails?: A Lot Help needed moving to and from a bed to a chair (including a wheelchair)?: Total Help needed standing up from a chair using your arms (e.g., wheelchair or bedside chair)?: Total Help needed to walk in hospital room?: Total Help needed climbing 3-5 steps with a railing? : Total 6 Click Score: 8    End of Session Equipment Utilized During Treatment: Gait belt;Other (comment) (TED hose and abdominal binder) Activity Tolerance: Treatment limited secondary to medical complications (Comment);Patient tolerated treatment well (orthostatics) Patient left: in bed;with call bell/phone within reach;with bed alarm set Nurse Communication: Mobility status PT Visit Diagnosis: Other abnormalities of gait and mobility (R26.89);Difficulty in walking, not elsewhere classified (R26.2);Other symptoms and signs involving the nervous system (R29.898)     Time: 4696-2952 PT Time Calculation (min) (ACUTE ONLY): 35 min  Charges:     $Therapeutic Activity: 23-37 mins PT General Charges $$ ACUTE PT VISIT: 1 Visit                     Sloan Duncans, DPT, CLT  Acute Rehabilitation Services Office: [phone] (Secure chat preferred)    Jenice Mitts 03/29/2024, 1:16 PM

## 2024-03-29 NOTE — Telephone Encounter (Signed)
 request to cancel appointment, pt in hospital

## 2024-03-30 LAB — GLUCOSE, CAPILLARY
Glucose-Capillary: 157 mg/dL — ABNORMAL HIGH (ref 70–99)
Glucose-Capillary: 168 mg/dL — ABNORMAL HIGH (ref 70–99)
Glucose-Capillary: 174 mg/dL — ABNORMAL HIGH (ref 70–99)
Glucose-Capillary: 197 mg/dL — ABNORMAL HIGH (ref 70–99)

## 2024-03-30 LAB — URINALYSIS, ROUTINE W REFLEX MICROSCOPIC

## 2024-03-30 LAB — URINALYSIS, MICROSCOPIC (REFLEX): RBC / HPF: >50 RBC/hpf (ref 0–5)

## 2024-03-30 MED ORDER — POLYETHYLENE GLYCOL 3350 17 G PO PACK
17.0000 g | PACK | Freq: Two times a day (BID) | ORAL | Status: DC
  Administered 2024-03-30 – 2024-03-31 (×3): 17 g via ORAL
  Filled 2024-03-30 (×3): qty 1

## 2024-03-30 MED ORDER — SMOG ENEMA
960.0000 mL | Freq: Once | RECTAL | Status: AC
  Administered 2024-03-30: 960 mL via RECTAL
  Filled 2024-03-30: qty 960

## 2024-03-30 MED ORDER — SENNA 8.6 MG PO TABS
1.0000 | ORAL_TABLET | Freq: Every day | ORAL | Status: DC
  Administered 2024-03-30 – 2024-03-31 (×2): 8.6 mg via ORAL
  Filled 2024-03-30 (×2): qty 1

## 2024-03-30 MED ORDER — MAGNESIUM HYDROXIDE 400 MG/5ML PO SUSP
15.0000 mL | Freq: Once | ORAL | Status: AC
  Administered 2024-03-30: 15 mL via ORAL
  Filled 2024-03-30: qty 30

## 2024-03-30 NOTE — TOC Progression Note (Addendum)
 Transition of Care [facility]) - Progression Note    Patient Details  Name: Jordan [name] MRN: [id number] Date of Birth: [birth year]  [facility] CM/SW Contact  Jonathan Neighbor, RN Phone Number: 03/30/2024, 3:38 PM  Clinical Narrative:     Pt without a BM for 6 days. Did have BM late today. Plan is for him to d/c to [name] tomorrow am. Laurina Popper at [name] says he will have bed tomorrow.  Expected Discharge Plan: Skilled Nursing Facility Barriers to Discharge: Continued Medical Work up  Expected Discharge Plan and Services   Discharge Planning Services: CM Consult Post Acute Care Choice: IP Rehab Living arrangements for the past 2 months: Single Family Home                                       Social Determinants of Health (SDOH) Interventions SDOH Screenings   Food Insecurity: No Food Insecurity (03/22/2024)  Housing: Low Risk  (03/22/2024)  Transportation Needs: No Transportation Needs (03/22/2024)  Utilities: Not At Risk (03/22/2024)  Depression (PHQ2-9): Low Risk  (04/12/2023)  Social Connections: Socially Integrated (03/29/2024)  Tobacco Use: Medium Risk (03/21/2024)    Readmission Risk Interventions     No data to display

## 2024-03-30 NOTE — Progress Notes (Signed)
 PROGRESS NOTE Jordan [name]  ZOX:[id number] DOB: [birth year] DOA: 03/21/2024 PCP: Roselind Congo, MD   Brief Narrative: 83 year old past medical history significant for CVA, diabetes type 2, chronic diastolic heart failure, arthritis, Parkinson's disease, CAD, DVT, gout, hypertension, hyperlipidemia, OSA on CPAP, metastatic neuroendocrine tumor presented with right-sided weakness and aphasia.  He was recently hospitalized from 4/26 until 5/2 for encephalopathy that was felt to be due to medication side effect of Seroquel .  CT and EEG were also unremarkable at that time. Seroquel  was adjusted to be given at 5 PM which helped with hallucinations, and agitation without oversedation.  This hospitalization he presented with aphasia and right-sided weakness. He was admitted for repeat stroke workup.  Thought to have had a TIA.  Symptoms were slowly improving upon admission.   Of note, had a repeat episode during hospitalization on 5/28 which again did not show signs of further stroke. Neurology evaluated. Also underwent EEG which was negative for stroke but showed moderate diffuse encephalopathy.   Medically stable for dc to [name] which will be delayed until tomorrow due to lack of BM since 5/24. He has been denying discomfort. Continuing his bowel regimen and adding enema.   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 -  CT head on admission showed remote infarct  involving anterior left frontal lobe and remote lacunar infarct in the cerebellum bilaterally. MRI brain was also negative for acute stroke. ECHO Ef 55 % - Continue with Xarelto  -5/28; noticed to have worsening strokelike symptoms, right facial droop worsening confusion.  CT head stat negative. Brain MRI negative.  Ultimately, medications likely contributing to fluctuating mental status and modifications were made to sinemet  as listed below.   Physical deconditioning: PT OT recommending SNF  Parkinson's disease: He probably has a component of underlying dementia in the setting of Parkinson disease Continue Sinemet  -changed dosing per neuro recs-  - 50-200 at bedtime  - 25-100 TID with meals - continue donepezil  To avoid oversedation Seroquel  was changed to daily around 5 PM.  May have also be causing some dystonia with a twice a day dosing  History of DVT: Continue with Xarelto   History of CVA CT head showed remote infarct left frontal lobe and remote lacunar infarct in the cerebellum Lipitor and Xarelto   Essential hypertension: Orthostatic Hypotension.  Midodrine  for orthostatic hypotension - TEd hose and abdominal binder.   Paroxysmal  A-fib: Continue with Xarelto   Chronic diastolic heart failure (HCC) - No signs/symptoms of exacerbation -Last echo 01/06/2022 showed EF 60 to 65%, indeterminate diastolic parameters. Repeat this admission EF 55%.    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 If pt undergoes a cardiac device procedure during that time, they should be re-cleared.   Tachy-therapies to be programmed off if applicable with device back to pre-MRI settings after completion of exam. 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) - Follows with Dr. 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 (Active)  01/06/24 1130  Location: Coccyx  Location Orientation: Posterior  Staging: Stage 1 -  Intact skin with non-blanchable redness of a localized area usually over a bony prominence.  Wound Description (Comments):   Present on Admission:   Dressing Type Foam - Lift dressing to assess site every shift 03/29/24 2100    Estimated body mass index is 30.8 kg/m as calculated from the following:   Height as of this encounter: 5\' 9"  (1.753 m).   Weight as of this encounter: 94.6 kg.   DVT prophylaxis: Xarelto  Code Status: Full code Family Communication: none at bedside Disposition Plan:  Status is: Inpatient Remains inpatient appropriate because: dispo pending. Medically stable for dc  Consultants:  Neurology   Subjective: Patient reports no concerns. Currently receiving a bath from nurse.   Objective: Vitals:   03/30/24 0108 03/30/24 0434 03/30/24 0434 03/30/24 0730  BP: (!) 147/99 110/72 110/72 (!) 147/71  Pulse: 65 82 82 67  Resp: 19 19  19   Temp: 97.8 F (36.6 C) 98.3 F (36.8 C) 98.3 F (36.8 C) 98.7 F (37.1 C)  TempSrc: Oral Oral Oral Oral  SpO2: 98% 96% 96% 100%   Weight:      Height:        Intake/Output Summary (Last 24 hours) at 03/30/2024 0738 Last data filed at 03/30/2024 0500 Gross per 24 hour  Intake 372.5 ml  Output 2000 ml  Net -1627.5 ml   Filed Weights   03/21/24 1055 03/21/24 1700  Weight: 94.6 kg 94.6 kg   Examination:  General exam: Appears calm and comfortable  Respiratory system: Clear to auscultation. Respiratory effort normal. Cardiovascular system: S1 & S2 heard, RRR. No JVD, murmurs, rubs, gallops or clicks. No pedal edema. Central nervous system: Alert to voice. No focal abnormalities.   Data Reviewed: I have personally reviewed following labs and imaging studies  CBC: Recent Labs  Lab 03/23/24 1051 03/28/24 1046  WBC 7.9 9.5  NEUTROABS 4.6  --   HGB 12.5* 12.5*  HCT 38.2* 39.5  MCV 85.1 86.6  PLT 225 254   Basic Metabolic Panel: Recent Labs  Lab 03/23/24 1051 03/28/24 1046  NA 140 141  K 3.5 4.3  CL 105 103  CO2 26 26  GLUCOSE 154* 165*  BUN 7* 13  CREATININE 0.77 1.01  CALCIUM  9.2 10.1  MG 1.6*  --    Radiology Studies: MR BRAIN WO CONTRAST Result Date: 03/28/2024 EXAM: MRI BRAIN WITHOUT CONTRAST 03/28/2024 02:22:27 PM TECHNIQUE: Multiplanar multisequence MRI of the head/brain was performed without the  administration of intravenous contrast. The study was discontinued early as the patient was unable to hold still for further imaging. Only diffusion-weighted images and axial FLAIR images were obtained. COMPARISON: CT head without contrast 03/28/2024. MR head without contrast 03/22/2024. CLINICAL HISTORY: Facial paralysis/weakness (CN 7). R sided weakness and aphasia. FINDINGS: BRAIN AND VENTRICLES: No acute infarct. No intracranial hemorrhage. No mass. No midline shift. No hydrocephalus. The sella is unremarkable. Normal flow voids. Chronic encephalomalacia of the anterior left frontal lobe is stable. A remote medial right occipital lobe infarct is stable. Periventricular white matter T2  hyperintensities are stable, mild. ORBITS: No acute abnormality. SINUSES AND MASTOIDS: No acute abnormality. BONES AND SOFT TISSUES: Normal marrow signal. No acute soft tissue abnormality. The study is moderately degraded by patient motion. IMPRESSION: 1. No acute intracranial abnormality. 2. Stable chronic encephalomalacia of the anterior left frontal lobe and remote medial right occipital lobe infarct. 3. Stable, mild periventricular white matter T2 hyperintensities. 4. Study limited by patient motion and early termination. Electronically signed by: Audree Leas MD 03/28/2024 06:33 PM EDT RP Workstation: [id number]   EEG adult Result Date: 03/28/2024 Arleene Lack, MD     03/28/2024  4:20 PM Patient Name: Jordan [name] MRN: [id number] Epilepsy Attending: Arleene Lack Referring Physician/Provider: Consuelo Denmark, MD Date: 03/28/2024 Duration: 24.08 mins Patient history: 83yo m with ams. EEG to evaluate for seizure. Level of alertness: comatose/ lethargic AEDs during EEG study: None Technical aspects: This EEG study was done with scalp electrodes positioned according to the 10-20 International system of electrode placement. Electrical activity was reviewed with band pass filter of 1-70Hz , sensitivity of 7 uV/mm, display speed of 25mm/sec with a 60Hz  notched filter applied as appropriate. EEG data were recorded continuously and digitally stored.  Video monitoring was available and reviewed as appropriate. Description: EEG showed continuous generalized 3 to 6 Hz theta-delta slowing. Hyperventilation and photic stimulation were not performed.   ABNORMALITY - Continuous slow, generalized IMPRESSION: This study is suggestive of moderate diffuse encephalopathy. No seizures or epileptiform discharges were seen throughout the recording. Priyanka Suzanne Erps   CT HEAD WO CONTRAST ( ) Result Date: 03/28/2024 CLINICAL DATA:  Altered mental status. EXAM: CT HEAD WITHOUT CONTRAST TECHNIQUE: Contiguous axial images  were obtained from the base of the skull through the vertex without intravenous contrast. RADIATION DOSE REDUCTION: This exam was performed according to the departmental dose-optimization program which includes automated exposure control, adjustment of the mA and/or kV according to patient size and/or use of iterative reconstruction technique. COMPARISON:  MRI head 03/22/2024. CT head and CTA head and neck 03/21/2024. FINDINGS: Brain: No acute intracranial hemorrhage. No CT evidence of acute infarct. Nonspecific hypoattenuation in the periventricular and subcortical white matter favored to reflect chronic microvascular ischemic changes. Redemonstrated remote infarcts in the left frontal lobe, left thalamus, and bilateral cerebellum. No edema, mass effect, or midline shift. The basilar cisterns are patent. Ventricles: Prominence of the ventricles suggesting underlying parenchymal volume loss. Vascular: Atherosclerotic calcifications of the carotid siphons and intracranial vertebral arteries. No hyperdense vessel. Skull: No acute or aggressive finding. Orbits: Orbits are symmetric. Sinuses: Mucosal thickening completely opacifying the left frontal sinus with additional scattered mucosal thickening in the ethmoid sinuses and air-fluid level in the left maxillary sinus. Other: Mastoid air cells are clear. IMPRESSION: No CT evidence of acute intracranial abnormality. Chronic microvascular ischemic changes. Moderate parenchymal volume loss. Remote infarcts as above. Paranasal sinus disease with air-fluid level in the left maxillary sinus concerning for acute sinusitis. Electronically  Signed   By: Denny Flack M.D.   On: 03/28/2024 13:21   Scheduled Meds:  atorvastatin   20 mg Oral QHS   carbidopa -levodopa   1 tablet Oral QHS   Carbidopa -Levodopa  ER  1 tablet Oral TID WC   donepezil  5 mg Oral Daily   insulin  aspart  0-6 Units Subcutaneous TID WC   melatonin  10 mg Oral QHS   midodrine   5 mg Oral BID WC    modafinil   200 mg Oral Daily   QUEtiapine   25 mg Oral QPM   rivaroxaban   20 mg Oral Daily   sodium chloride  flush  3 mL Intravenous Q12H   tamsulosin   0.4 mg Oral QHS   Continuous Infusions:     LOS: 9 days    Time spent: 35 minutes    Ree Candy, MD [facility]   If 7PM-7AM, please contact night-coverage [email or web address]  03/30/2024, 7:38 AM

## 2024-03-30 NOTE — Plan of Care (Signed)
  Problem: Education: Goal: Knowledge of disease or condition will improve Outcome: Progressing   Problem: Ischemic Stroke/TIA Tissue Perfusion: Goal: Complications of ischemic stroke/TIA will be minimized Outcome: Progressing   Problem: Coping: Goal: Will verbalize positive feelings about self Outcome: Progressing   Problem: Health Behavior/Discharge Planning: Goal: Ability to manage health-related needs will improve Outcome: Not Progressing Goal: Goals will be collaboratively established with patient/family Outcome: Progressing   Problem: Self-Care: Goal: Ability to participate in self-care as condition permits will improve Outcome: Not Progressing Goal: Verbalization of feelings and concerns over difficulty with self-care will improve Outcome: Progressing Goal: Ability to communicate needs accurately will improve Outcome: Progressing   Problem: Nutrition: Goal: Risk of aspiration will decrease Outcome: Progressing Goal: Dietary intake will improve Outcome: Progressing   Problem: Education: Goal: Ability to describe self-care measures that may prevent or decrease complications (Diabetes Survival Skills Education) will improve Outcome: Progressing Goal: Individualized Educational Video(s) Outcome: Progressing   Problem: Coping: Goal: Ability to adjust to condition or change in health will improve Outcome: Progressing   Problem: Fluid Volume: Goal: Ability to maintain a balanced intake and output will improve Outcome: Progressing   Problem: Health Behavior/Discharge Planning: Goal: Ability to identify and utilize available resources and services will improve Outcome: Progressing Goal: Ability to manage health-related needs will improve Outcome: Progressing   Problem: Metabolic: Goal: Ability to maintain appropriate glucose levels will improve Outcome: Progressing   Problem: Nutritional: Goal: Maintenance of adequate nutrition will improve Outcome:  Progressing Goal: Progress toward achieving an optimal weight will improve Outcome: Progressing   Problem: Skin Integrity: Goal: Risk for impaired skin integrity will decrease Outcome: Progressing   Problem: Tissue Perfusion: Goal: Adequacy of tissue perfusion will improve Outcome: Progressing   Problem: Health Behavior/Discharge Planning: Goal: Ability to manage health-related needs will improve Outcome: Progressing   Problem: Clinical Measurements: Goal: Ability to maintain clinical measurements within normal limits will improve Outcome: Progressing Goal: Will remain free from infection Outcome: Progressing Goal: Diagnostic test results will improve Outcome: Progressing Goal: Respiratory complications will improve Outcome: Progressing Goal: Cardiovascular complication will be avoided Outcome: Progressing   Problem: Activity: Goal: Risk for activity intolerance will decrease Outcome: Progressing   Problem: Coping: Goal: Level of anxiety will decrease Outcome: Progressing   Problem: Elimination: Goal: Will not experience complications related to bowel motility Outcome: Progressing Goal: Will not experience complications related to urinary retention Outcome: Progressing   Problem: Pain Managment: Goal: General experience of comfort will improve and/or be controlled Outcome: Progressing   Problem: Skin Integrity: Goal: Risk for impaired skin integrity will decrease Outcome: Progressing

## 2024-03-31 LAB — GLUCOSE, CAPILLARY
Glucose-Capillary: 143 mg/dL — ABNORMAL HIGH (ref 70–99)
Glucose-Capillary: 280 mg/dL — ABNORMAL HIGH (ref 70–99)

## 2024-03-31 MED ORDER — POLYETHYLENE GLYCOL 3350 17 G PO PACK: 17.0000 g | PACK | Freq: Two times a day (BID) | ORAL | Status: AC

## 2024-03-31 MED ORDER — DONEPEZIL HCL 5 MG PO TABS: 5.0000 mg | ORAL_TABLET | Freq: Every day | ORAL | Status: DC

## 2024-03-31 MED ORDER — SENNA 8.6 MG PO TABS: 1.0000 | ORAL_TABLET | Freq: Every day | ORAL | Status: AC

## 2024-03-31 MED ORDER — CARBIDOPA-LEVODOPA ER 25-100 MG PO TBCR: 1.0000 | EXTENDED_RELEASE_TABLET | Freq: Three times a day (TID) | ORAL | Status: AC

## 2024-03-31 MED ORDER — CEPHALEXIN 500 MG PO CAPS: 500.0000 mg | ORAL_CAPSULE | Freq: Three times a day (TID) | ORAL | Status: AC

## 2024-03-31 MED ORDER — SODIUM CHLORIDE 0.9 % IV SOLN
1.0000 g | Freq: Once | INTRAVENOUS | Status: AC
  Administered 2024-03-31: 1 g via INTRAVENOUS
  Filled 2024-03-31: qty 10

## 2024-03-31 MED ORDER — CARBIDOPA-LEVODOPA ER 50-200 MG PO TBCR
1.0000 | EXTENDED_RELEASE_TABLET | Freq: Every day | ORAL | Status: DC
Start: 2024-03-31 — End: 2024-05-10

## 2024-03-31 NOTE — Discharge Summary (Signed)
 Physician Discharge Summary  Jordan [name] RUE:[id number] DOB: 1941/09/18 DOA: 03/21/2024  PCP: Jordan Congo, MD  Admit date: 03/21/2024 Discharge date: 03/31/2024  Admitted From: Home Disposition: Skilled nursing facility  Recommendations for Outpatient Follow-up:  Follow up with PCP in 1-2 weeks Please obtain BMP/CBC in one week Outpatient neurology follow-up  Home Health: N/A Equipment/Devices: N/A  Discharge Condition: Fair CODE STATUS: Full code Diet recommendation: Low-salt diet, nutritional supplements  Discharge summary: 83 year old past medical history significant for stroke , diabetes type 2, chronic diastolic heart failure, arthritis, Parkinson's disease, CAD, DVT, gout, hypertension, hyperlipidemia, OSA on CPAP, metastatic neuroendocrine tumor presented with right-sided weakness and aphasia.  He was recently hospitalized from 4/26 until 5/2 for encephalopathy that was felt to be due to medication side effect of Seroquel .  CT and EEG were also unremarkable at that time. Seroquel  was adjusted to be given at 5 PM which helped with hallucinations, and agitation without oversedation.  This hospitalization he presented with aphasia and right-sided weakness. He was admitted for repeat stroke workup.  Thought to have had a TIA.  Symptoms were slowly improving upon admission.   Of note, had a repeat episode during hospitalization on 5/28 which again did not show signs of further stroke. Neurology evaluated. Also underwent EEG which was negative for stroke but showed moderate diffuse encephalopathy.  Patient remains overall in poor clinical status, currently needing rehab so transferring to a skilled nursing facility. Treated for following conditions. 5/31, on the day of discharge patient with dark urine, low-grade fever.  Urinalysis shows many bacteria.  Patient urinating without difficulties. Will give 1 dose of Rocephin , collect urine cultures and discharged on oral Keflex for 7  days and follow-up on urine cultures.  TIA- Patient presented with aphasia and right-sided weakness started slowly improved during admission. - CT head on admission showed remote infarct involving anterior left frontal lobe and remote lacunar infarct in the cerebellum bilaterally. MRI brain was also negative for acute stroke. ECHO Ef 55 % - Continue with Xarelto  -5/28; noticed to have worsening strokelike symptoms, right facial droop worsening confusion.  CT head stat negative. Brain MRI negative.  Ultimately, medications likely contributing to fluctuating mental status and modifications were made to sinemet  as listed below.    Physical deconditioning: PT OT recommending SNF   Parkinson's disease: He probably has a component of underlying dementia in the setting of Parkinson disease Continue Sinemet  -changed dosing per neuro recs-  - 50-200 at bedtime  - 25-100 TID with meals -continue donepezil  and modafinil .  To avoid oversedation Seroquel  was changed to daily around 5 PM.  May have also be causing some dystonia with a twice a day dosing.  Avoid constipation, schedule Miralax  and stool softner.   History of DVT: Continue with Xarelto    History of CVA CT head showed remote infarct left frontal lobe and remote lacunar infarct in the cerebellum Lipitor and Xarelto    Essential hypertension: Orthostatic Hypotension.  Midodrine  for orthostatic hypotension - TEd hose and abdominal binder.    Paroxysmal  A-fib: Continue with Xarelto . Now in sinus rhythm.    Chronic diastolic heart failure (HCC) - No signs/symptoms of exacerbation - Repeat this admission EF 55%.    Type 2 diabetes mellitus with hyperlipidemia (HCC)- A1c 6.4% - Resume metformin  on discharge.   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  Noted to have dark urine today with plenty of bacteria.  Patient unable to express any symptoms.  Will treat as UTI and follow-up on urine cultures.  1 dose of IV Rocephin  before discharge and discharging on oral Keflex. Color of his Urine is always dark, however this time he does have some bacteria in his urine.  Will follow-up urine culture and do any changes if needed.   Remains with extensive medical issues.  Can be transferred to skilled nursing facility level of care however he will need ongoing goal of care discussions and also aggressive rehab.   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  Patient is Jordan [name]'s patient. Thanks.   Diet - low sodium heart healthy   Complete by: As directed    Increase activity slowly   Complete by: As directed    No wound care   Complete by: As directed       Allergies as of 03/31/2024       Reactions   Amantadines Other (See Comments)   Mental changes   Clonazepam  Other (See Comments)   Mental changes   Lisinopril  Other (See Comments)   Unknown reaction   Zoloft   [sertraline ] Other (See Comments)   Mental changes        Medication List     STOP taking these medications    docusate sodium  100 MG capsule Commonly known as: COLACE   multivitamin with minerals Tabs tablet       TAKE these medications    acetaminophen  500 MG tablet Commonly known as: TYLENOL  Take 500-1,000 mg by mouth every 6 (six) hours as needed for moderate pain or mild pain.   atorvastatin  20 MG tablet Commonly known as: LIPITOR Take 20 mg by mouth daily.   b complex vitamins capsule Take 1 capsule by mouth at bedtime.   bifidobacterium infantis capsule Take 1 capsule by mouth daily.   carbidopa -levodopa  50-200 MG tablet Commonly known as: SINEMET  CR Take 1 tablet by mouth at bedtime. What changed: when to take this   Carbidopa -Levodopa  ER 25-100 MG tablet controlled release Commonly known as: SINEMET  CR Take 1 tablet by mouth 3 (three) times daily with meals. What changed: You were already taking a medication with the same name, and this prescription was added. Make sure you understand how and when to take each.   cephALEXin 500 MG capsule Commonly known as: KEFLEX Take 1 capsule (500 mg total) by mouth 3 (three) times daily for 7 days.   clotrimazole-betamethasone cream Commonly known as: LOTRISONE Apply  1 Application topically daily as needed (Rash).   donepezil  5 MG tablet Commonly known as: ARICEPT  Take 1 tablet (5 mg total) by mouth daily. Start taking on: April 01, 2024   feeding supplement (GLUCERNA SHAKE) Liqd Take 237 mLs by mouth 2 (two) times daily between meals. Alternate with ensure Diabetic   HM SALONPAS PAIN RELIEF EX Apply 1 patch topically daily as needed (Back pain).   Januvia 100 MG tablet Generic drug: sitaGLIPtin Take 100 mg by mouth daily.   lidocaine  5 % Commonly known as: LIDODERM  Place 1 patch onto the skin daily as needed. Purchase over the counter. On for 12 hours and off for 12 hours What changed:  reasons to  take this additional instructions   Melatonin 10 MG Tabs Take 10 mg by mouth at bedtime.   metFORMIN  500 MG tablet Commonly known as: GLUCOPHAGE  Take 1,000 mg by mouth 2 (two) times daily.   Methylcobalamin 5000 MCG Chew Chew 500 mcg by mouth at bedtime.   midodrine  5 MG tablet Commonly known as: PROAMATINE  Take 1 tablet (5 mg total) by mouth 2 (two) times daily with a meal. HOLD IF SBP >130   modafinil  200 MG tablet Commonly known as: PROVIGIL  TAKE 1 TABLET BY MOUTH EVERY DAY   Nitrostat  0.4 MG SL tablet Generic drug: nitroGLYCERIN  PLACE 1 TABLET (0.4 MG TOTAL) UNDER THE TONGUE EVERY 5 (FIVE) MINUTES AS NEEDED FOR CHEST PAIN.   OMEGA 3 PO Take 1,280 mg by mouth at bedtime.   OVER THE COUNTER MEDICATION Take 72 mg by mouth in the morning and at bedtime. Ferrasorb   polyethylene glycol 17 g packet Commonly known as: MIRALAX  / GLYCOLAX  Take 17 g by mouth 2 (two) times daily. What changed: when to take this   polyvinyl alcohol  1.4 % ophthalmic solution Commonly known as: LIQUIFILM TEARS Place 1 drop into both eyes as needed for dry eyes. What changed: when to take this   QUEtiapine  25 MG tablet Commonly known as: SEROQUEL  Take 1 tablet (25 mg total) by mouth every evening. TAKE AT 5 PM   rivaroxaban  20 MG Tabs tablet Commonly known as: Xarelto  Take 1 tablet (20 mg total) by mouth daily.   senna 8.6 MG Tabs tablet Commonly known as: SENOKOT Take 1 tablet (8.6 mg total) by mouth daily. Start taking on: April 01, 2024   tamsulosin  0.4 MG Caps capsule Commonly known as: FLOMAX  Take 0.4 mg by mouth at bedtime.   Vitamin D  (Ergocalciferol ) 1.25 MG (50000 UNIT) Caps capsule Commonly known as: DRISDOL  TAKE 1 CAPSULE (50,000 UNITS TOTAL) BY MOUTH EVERY 7 (SEVEN) DAYS What changed: additional instructions   Vitamin D  50 MCG (2000 UT) Caps Take 2,000 Units by mouth at bedtime.               Durable Medical Equipment  (From admission, onward)            Start     Ordered   03/23/24 1552  For home use only DME Other see comment  Once       Comments: Transport chair with leg rests and anti tip bars  Question:  Length of Need  Answer:  Lifetime   03/23/24 1552            Follow-up Information     [facility] Follow up.   Why: Lennart Quitter will contact you for the first home visitl Contact information: [address] [place] [address] [phone]  Johny Nap, NP. Schedule an appointment as soon as possible for a visit in 1 month(s).   Specialty: Neurology Why: stroke clinic Contact information: [address] [place] [address] [phone]                Allergies  Allergen Reactions   Amantadines Other (See Comments)    Mental changes   Clonazepam  Other (See Comments)    Mental changes   Lisinopril  Other (See Comments)    Unknown reaction   Zoloft  [Sertraline ] Other (See Comments)    Mental changes    Consultations: Neurology   Procedures/Studies: MR BRAIN WO CONTRAST Result Date: 03/28/2024 EXAM: MRI BRAIN WITHOUT CONTRAST 03/28/2024 02:22:27 PM TECHNIQUE: Multiplanar multisequence MRI of the head/brain was performed without the administration of intravenous contrast. The study was discontinued early as the patient was unable to hold still for further imaging. Only diffusion-weighted images and axial FLAIR images were obtained. COMPARISON: CT head without contrast 03/28/2024. MR head without contrast 03/22/2024. CLINICAL HISTORY: Facial paralysis/weakness (CN 7). R sided weakness and aphasia. FINDINGS: BRAIN AND VENTRICLES: No acute infarct. No intracranial hemorrhage. No mass. No midline shift. No hydrocephalus. The sella is unremarkable. Normal flow voids. Chronic encephalomalacia of the anterior left frontal lobe is stable. A remote medial right occipital lobe infarct is stable. Periventricular white matter T2 hyperintensities are stable, mild. ORBITS: No acute  abnormality. SINUSES AND MASTOIDS: No acute abnormality. BONES AND SOFT TISSUES: Normal marrow signal. No acute soft tissue abnormality. The study is moderately degraded by patient motion. IMPRESSION: 1. No acute intracranial abnormality. 2. Stable chronic encephalomalacia of the anterior left frontal lobe and remote medial right occipital lobe infarct. 3. Stable, mild periventricular white matter T2 hyperintensities. 4. Study limited by patient motion and early termination. Electronically signed by: Audree Leas MD 03/28/2024 06:33 PM EDT RP Workstation: [id number]   EEG adult Result Date: 03/28/2024 Arleene Lack, MD     03/28/2024  4:20 PM Patient Name: MANDO BLATZ MRN: [id number] Epilepsy Attending: Arleene Lack Referring Physician/Provider: Consuelo Denmark, MD Date: 03/28/2024 Duration: 24.08 mins Patient history: 83yo m with ams. EEG to evaluate for seizure. Level of alertness: comatose/ lethargic AEDs during EEG study: None Technical aspects: This EEG study was done with scalp electrodes positioned according to the 10-20 International system of electrode placement. Electrical activity was reviewed with band pass filter of 1-70Hz , sensitivity of 7 uV/mm, display speed of 69mm/sec with a 60Hz  notched filter applied as appropriate. EEG data were recorded continuously and digitally stored.  Video monitoring was available and reviewed as appropriate. Description: EEG showed continuous generalized 3 to 6 Hz theta-delta slowing. Hyperventilation and photic stimulation were not performed.   ABNORMALITY - Continuous slow, generalized IMPRESSION: This study is suggestive of moderate diffuse encephalopathy. No seizures or epileptiform discharges were seen throughout the recording. Priyanka Suzanne Erps   CT HEAD WO CONTRAST ( ) Result Date: 03/28/2024 CLINICAL DATA:  Altered mental status. EXAM: CT HEAD WITHOUT CONTRAST TECHNIQUE: Contiguous axial images were obtained from the base of the skull through  the vertex without intravenous contrast. RADIATION DOSE REDUCTION: This exam was performed according to the departmental dose-optimization program which includes automated exposure control, adjustment of the mA and/or kV according to patient size and/or use of iterative reconstruction technique. COMPARISON:  MRI head 03/22/2024. CT head and CTA head and neck 03/21/2024. FINDINGS: Brain: No acute intracranial hemorrhage. No CT evidence of acute infarct. Nonspecific hypoattenuation in the periventricular and subcortical white matter favored to reflect  chronic microvascular ischemic changes. Redemonstrated remote infarcts in the left frontal lobe, left thalamus, and bilateral cerebellum. No edema, mass effect, or midline shift. The basilar cisterns are patent. Ventricles: Prominence of the ventricles suggesting underlying parenchymal volume loss. Vascular: Atherosclerotic calcifications of the carotid siphons and intracranial vertebral arteries. No hyperdense vessel. Skull: No acute or aggressive finding. Orbits: Orbits are symmetric. Sinuses: Mucosal thickening completely opacifying the left frontal sinus with additional scattered mucosal thickening in the ethmoid sinuses and air-fluid level in the left maxillary sinus. Other: Mastoid air cells are clear. IMPRESSION: No CT evidence of acute intracranial abnormality. Chronic microvascular ischemic changes. Moderate parenchymal volume loss. Remote infarcts as above. Paranasal sinus disease with air-fluid level in the left maxillary sinus concerning for acute sinusitis. Electronically Signed   By: Denny Flack M.D.   On: 03/28/2024 13:21   ECHOCARDIOGRAM COMPLETE Result Date: 03/22/2024    ECHOCARDIOGRAM REPORT   Patient Name:   DURRELL BARAJAS Date of Exam: 03/22/2024 Medical Rec #:  [id number]       Height:       69.0 in Accession #:    [id number]      Weight:       208.6 lb Date of Birth:  1940/11/27       BSA:          2.103 m Patient Age:    83 years        BP:            133/87 mmHg Patient Gender: M               HR:           68 bpm. Exam Location:  Inpatient Procedure: 2D Echo, Cardiac Doppler, Color Doppler, Intracardiac Opacification            Agent and Saline Contrast Bubble Study (Both Spectral and Color Flow            Doppler were utilized during procedure). Indications:    Stroke  History:        Patient has prior history of Echocardiogram examinations, most                 recent 01/06/2022. Signs/Symptoms:Shortness of Breath; Risk                 Factors:Sleep Apnea, Dyslipidemia, Hypertension and Diabetes.                 DVT, DOE.  Sonographer:    Juanita Shaw Referring Phys: [id number] DEVON SHAFER IMPRESSIONS  1. Left ventricular ejection fraction, by estimation, is 55 to 60%. The left ventricle has normal function. Left ventricular endocardial border not optimally defined to evaluate regional wall motion. Left ventricular diastolic parameters are indeterminate.  2. Right ventricular systolic function is normal. The right ventricular size is normal. There is normal pulmonary artery systolic pressure.  3. The mitral valve was not well visualized. No evidence of mitral valve regurgitation. No evidence of mitral stenosis.  4. The aortic valve was not well visualized. There is moderate calcification of the aortic valve. Aortic valve regurgitation is not visualized. No aortic stenosis is present.  5. The inferior vena cava is normal in size with greater than 50% respiratory variability, suggesting right atrial pressure of 3 mmHg. FINDINGS  Left Ventricle: Left ventricular ejection fraction, by estimation, is 55 to 60%. The left ventricle has normal function. Left ventricular endocardial border not optimally defined to evaluate regional wall motion. The left  ventricular internal cavity size was normal in size. There is no left ventricular hypertrophy. Left ventricular diastolic function could not be evaluated due to atrial fibrillation. Left ventricular diastolic  parameters are indeterminate. Right Ventricle: The right ventricular size is normal. No increase in right ventricular wall thickness. Right ventricular systolic function is normal. There is normal pulmonary artery systolic pressure. The tricuspid regurgitant velocity is 2.02 m/s, and  with an assumed right atrial pressure of 3 mmHg, the estimated right ventricular systolic pressure is 19.3 mmHg. Left Atrium: Left atrial size was normal in size. Right Atrium: Right atrial size was normal in size. Pericardium: There is no evidence of pericardial effusion. Mitral Valve: The mitral valve was not well visualized. Mild mitral annular calcification. No evidence of mitral valve regurgitation. No evidence of mitral valve stenosis. MV peak gradient, 4.9 mmHg. The mean mitral valve gradient is 2.0 mmHg. Tricuspid Valve: The tricuspid valve is not well visualized. Tricuspid valve regurgitation is not demonstrated. No evidence of tricuspid stenosis. Aortic Valve: The aortic valve was not well visualized. There is moderate calcification of the aortic valve. There is moderate aortic valve annular calcification. Aortic valve regurgitation is not visualized. No aortic stenosis is present. Aortic valve mean gradient measures 2.0 mmHg. Aortic valve peak gradient measures 4.8 mmHg. Aortic valve area, by VTI measures 3.30 cm. Pulmonic Valve: The pulmonic valve was not well visualized. Pulmonic valve regurgitation is not visualized. No evidence of pulmonic stenosis. Aorta: The aortic root is normal in size and structure. Venous: The inferior vena cava is normal in size with greater than 50% respiratory variability, suggesting right atrial pressure of 3 mmHg. IAS/Shunts: No atrial level shunt detected by color flow Doppler.  LEFT VENTRICLE PLAX 2D LVIDd:         4.90 cm      Diastology LVIDs:         3.30 cm      LV e' medial:    5.44 cm/s LV PW:         0.90 cm      LV E/e' medial:  12.8 LV IVS:        0.90 cm      LV e' lateral:   4.51  cm/s LVOT diam:     1.80 cm      LV E/e' lateral: 15.5 LV SV:         52 LV SV Index:   25 LVOT Area:     2.54 cm  LV Volumes (MOD) LV vol d, MOD A2C: 185.0 ml LV vol d, MOD A4C: 154.0 ml LV vol s, MOD A2C: 48.6 ml LV vol s, MOD A4C: 52.7 ml LV SV MOD A2C:     136.4 ml LV SV MOD A4C:     154.0 ml LV SV MOD BP:      114.2 ml RIGHT VENTRICLE             IVC RV Basal diam:  3.50 cm     IVC diam: 1.50 cm RV Mid diam:    2.70 cm RV S prime:     11.30 cm/s LEFT ATRIUM             Index        RIGHT ATRIUM          Index LA diam:        3.70 cm 1.76 cm/m   RA Area:     6.97 cm LA Vol (A2C):   21.1 ml 10.03 ml/m  RA Volume:   7.65 ml  3.64 ml/m LA Vol (A4C):   57.2 ml 27.20 ml/m LA Biplane Vol: 36.6 ml 17.40 ml/m  AORTIC VALVE                    PULMONIC VALVE AV Area (Vmax):    2.47 cm     PV Vmax:       0.60 m/s AV Area (Vmean):   2.50 cm     PV Peak grad:  1.5 mmHg AV Area (VTI):     3.30 cm AV Vmax:           109.00 cm/s AV Vmean:          66.900 cm/s AV VTI:            0.159 m AV Peak Grad:      4.8 mmHg AV Mean Grad:      2.0 mmHg LVOT Vmax:         106.00 cm/s LVOT Vmean:        65.800 cm/s LVOT VTI:          0.206 m LVOT/AV VTI ratio: 1.30  AORTA Ao Root diam: 2.90 cm Ao Asc diam:  2.80 cm MITRAL VALVE                TRICUSPID VALVE MV Area (PHT): 2.10 cm     TR Peak grad:   16.3 mmHg MV Area VTI:   2.09 cm     TR Vmax:        202.00 cm/s MV Peak grad:  4.9 mmHg MV Mean grad:  2.0 mmHg     SHUNTS MV Vmax:       1.11 m/s     Systemic VTI:  0.21 m MV Vmean:      68.0 cm/s    Systemic Diam: 1.80 cm MV Decel Time: 362 msec MV E velocity: 69.80 cm/s MV A velocity: 105.00 cm/s MV E/A ratio:  0.66 Aditya Sabharwal Electronically signed by Alwin Baars Signature Date/Time: 03/22/2024/5:14:37 PM    Final    MR BRAIN WO CONTRAST Result Date: 03/22/2024 CLINICAL DATA:  Stroke follow-up. History of metastatic neuroendocrine tumor. EXAM: MRI HEAD WITHOUT CONTRAST TECHNIQUE: Multiplanar, multiecho pulse  sequences of the brain and surrounding structures were obtained without intravenous contrast. COMPARISON:  Head CT from yesterday FINDINGS: Brain: No acute infarction, hemorrhage, hydrocephalus, extra-axial collection or mass lesion. Small chronic infarcts scattered in the bilateral cerebellum. Chronic lacunar infarcts in the bilateral thalamus and deep white matter. Encephalomalacia in the anterior left frontal lobe adjacent to a calvarial defect, presumably from remote trauma. Generalized brain atrophy. Chronic small vessel ischemia in the cerebral white matter. Chronic right occipital cortex infarct. Vascular: Major flow voids are preserved Skull and upper cervical spine: Normal marrow signal Sinuses/Orbits: Bilateral cataract resection. Complete opacification of the left frontal sinus in proximity to the described skull deformity. In general sinus inflammation has improved from 12/22/2023 head CT. IMPRESSION: No acute or reversible finding. Brain atrophy and small chronic infarcts as described. Electronically Signed   By: Ronnette Coke M.D.   On: 03/22/2024 09:25   EEG adult Result Date: 03/22/2024 Arleene Lack, MD     03/22/2024  8:00 AM Patient Name: URBANO MILHOUSE MRN: [id number] Epilepsy Attending: Arleene Lack Referring Physician/Provider: Imogene Mana, NP Date: 03/21/2024 Duration: 22.44 mins Patient history: 83yo M with left gaze preference and right-sided weakness. EEG to evaluate for seizure Level of alertness: Awake AEDs  during EEG study: None Technical aspects: This EEG study was done with scalp electrodes positioned according to the 10-20 International system of electrode placement. Electrical activity was reviewed with band pass filter of 1-70Hz , sensitivity of 7 uV/mm, display speed of 72mm/sec with a 60Hz  notched filter applied as appropriate. EEG data were recorded continuously and digitally stored.  Video monitoring was available and reviewed as appropriate. Description: The  posterior dominant rhythm consists of 7 Hz activity of moderate voltage (25-35 uV) seen predominantly in posterior head regions, symmetric and reactive to eye opening and eye closing. EEG showed continuous generalized predominantly 5 to 6 Hz theta slowing admixed with intermittent 2-3hz  delta slowing.  Hyperventilation and photic stimulation were not performed.   ABNORMALITY - Continuous slow, generalized IMPRESSION: This study is suggestive of moderate diffuse encephalopathy. No seizures or epileptiform discharges were seen throughout the recording. Priyanka Suzanne Erps   CT ANGIO HEAD NECK W WO CM (CODE STROKE) Result Date: 03/21/2024 CLINICAL DATA:  Neuro deficit, concern for stroke, right-sided weakness, slurred speech. EXAM: CT ANGIOGRAPHY HEAD AND NECK WITH AND WITHOUT CONTRAST TECHNIQUE: Multidetector CT imaging of the head and neck was performed using the standard protocol during bolus administration of intravenous contrast. Multiplanar CT image reconstructions and MIPs were obtained to evaluate the vascular anatomy. Carotid stenosis measurements (when applicable) are obtained utilizing NASCET criteria, using the distal internal carotid diameter as the denominator. RADIATION DOSE REDUCTION: This exam was performed according to the departmental dose-optimization program which includes automated exposure control, adjustment of the mA and/or kV according to patient size and/or use of iterative reconstruction technique. CONTRAST:  75mL OMNIPAQUE  IOHEXOL  350 MG/ML SOLN COMPARISON:  CT head earlier same day. FINDINGS: CTA NECK FINDINGS Aortic arch: Standard configuration of the aortic arch. Imaged portion shows no evidence of aneurysm or dissection. Mild atherosclerosis along the aortic arch and visualized proximal descending thoracic aorta. No significant stenosis of the major arch vessel origins. Pulmonary arteries: As permitted by contrast timing, there are no filling defects in the visualized pulmonary  arteries. Subclavian arteries: The subclavian arteries are patent bilaterally. Right carotid system: Atherosclerosis noted along the distal brachiocephalic artery without significant narrowing at the origin of the right common carotid artery. Right carotid artery is patent from the origin to the skull base. Calcified atherosclerosis at the carotid bifurcation resulting in approximately 30% stenosis. Left carotid system: Patent from the origin to the skull base. Calcified atherosclerosis at the carotid bifurcation without hemodynamically significant stenosis. Vertebral arteries: Codominant. No evidence of dissection, stenosis (50% or greater), or occlusion. Atherosclerosis of the bilateral V4 segments resulting in mild stenosis. Skeleton: No acute findings. Degenerative changes in the cervical spine. Disc space narrowing most pronounced at C4-5 and C5-6. Other neck: The visualized airway is patent. No cervical lymphadenopathy. Upper chest: Atelectasis in the dependent aspect of both lungs. Partially visualized left chest wall pacer device. Review of the MIP images confirms the above findings CTA HEAD FINDINGS ANTERIOR CIRCULATION: The intracranial ICAs are patent bilaterally. Atherosclerosis of the bilateral carotid siphons. Mild stenosis of the right supraclinoid ICA. No significant stenosis, proximal occlusion, aneurysm, or vascular malformation. MCAs: The middle cerebral arteries are patent bilaterally. ACAs: The anterior cerebral arteries are patent bilaterally. POSTERIOR CIRCULATION: No significant stenosis, proximal occlusion, aneurysm, or vascular malformation. PCAs: Patent bilaterally. Focal severe stenosis of the P2 segment right PCA Pcomm: Not well visualized. SCAs: The superior cerebellar arteries are patent bilaterally. Basilar artery: Patent AICAs: Visualized on the left. PICAs: Visualized on the right. Vertebral  arteries: The intracranial vertebral arteries are patent. Venous sinuses: As permitted by  contrast timing, patent. Anatomic variants: None Review of the MIP images confirms the above findings IMPRESSION: No large vessel occlusion. Focal severe stenosis of the P2 segment right PCA. Otherwise no high-grade stenosis identified. Atherosclerosis at the carotid bifurcations. 30% stenosis at the origin of the right cervical ICA. Mild stenosis of the right supraclinoid ICA. Additional mild stenosis of the V4 segments of the bilateral vertebral arteries. Aortic Atherosclerosis (ICD10-I70.0). Electronically Signed   By: Denny Flack M.D.   On: 03/21/2024 18:54   CT HEAD CODE STROKE WO CONTRAST Result Date: 03/21/2024 EXAM: CT HEAD WITHOUT 03/21/2024 05:33:57 PM TECHNIQUE: CT of the head was performed without the administration of intravenous contrast. Automated exposure control, iterative reconstruction, and/or weight based adjustment of the mA/kV was utilized to reduce the radiation dose to as low as reasonably achievable. COMPARISON: CT head without contrast 02/25/2024. CLINICAL HISTORY: Neuro deficit, acute, stroke suspected. R/S weakness, slurred speech. FINDINGS: BRAIN AND VENTRICLES: Remote infarct is noted in the anterior left frontal lobe. Remote lacunar infarcts are present in the cerebellum bilaterally. No acute infarct, hemorrhage, or mass lesion is present. Moderate atrophy and white matter changes bilaterally are stable. The ventricles are proportionate to the degree of atrophy. ASPECTS: . Ganglionic level: 7/7 Supraganglionic level:3/3. Total: 10/10 . Aaron Aas ORBITS: Lens replacements are noted bilaterally. The globes and orbits are otherwise within normal limits. SINUSES: A posterior right ethmoid air cell is opacified. Anterior left ethmoid air cells and the left frontal sinus are opacified. Chronic wall thickening is noted in the left maxillary sinus. Minimal secretions are present in the left maxillary sinus. No fluid level is present. SOFT TISSUES AND SKULL: No acute abnormality of the visualized  skull or soft tissues. VASCULATURE: Atherosclerotic calcifications are present within the cavernous internal carotid arteries bilaterally. No hyperdense vessel is present. IMPRESSION: 1. No acute intracranial abnormality. 2. Remote infarct in the anterior left frontal lobe and remote lacunar infarcts in the cerebellum bilaterally. 3. Stable moderate atrophy and white matter changes bilaterally. Key findings were texted via amion to Dr. Cleone Dad at 5:51pm Electronically signed by: Audree Leas MD 03/21/2024 05:52 PM EDT RP Workstation: [id number]   (Echo, Carotid, EGD, Colonoscopy, ERCP)    Subjective: Patient seen and examined.  He denies any complaints.  He was slow to respond but he takes his time and talks slowly.  Recorded 1 episode of temperature 100.4 overnight without any symptoms patient experiencing.  He has some dry cough.  Denies any congestion, sputum production or wheezing. Patient was not sure about rehab placement. Had successful bowel movement yesterday.  Denies any abdominal pain or nausea.  No family at the bedside. He does have dark tea colored urine on his collection canister.  Discharge Exam: Vitals:   03/31/24 0328 03/31/24 0755  BP: (!) 153/96 (!) 140/95  Pulse: 81 64  Resp:    Temp: 98 F (36.7 C) (!) 100.4 F (38 C)  SpO2: 92% 99%   Vitals:   03/30/24 2040 03/31/24 0007 03/31/24 0328 03/31/24 0755  BP: (!) 167/83 (!) 162/102 (!) 153/96 (!) 140/95  Pulse: 81 70 81 64  Resp:  18    Temp: 99 F (37.2 C) 97.6 F (36.4 C) 98 F (36.7 C) (!) 100.4 F (38 C)  TempSrc: Oral Oral Oral Axillary  SpO2: 93% 95% 92% 99%  Weight:      Height:        General: Pt is  alert, awake, not in acute distress Frail and debility.  Chronically sick looking. Cardiovascular: RRR, S1/S2 +, no rubs, no gallops, pacemaker left precordium. Respiratory: CTA bilaterally, no wheezing, no rhonchi.  No added sounds. Abdominal: Soft, NT, ND, bowel sounds + Extremities: no edema, no  cyanosis Patient is alert awake.  Very slow to respond.  No obvious tremors.  Mostly oriented.  Strong grip on the upper extremities.  He has generalized weakness both legs.    The results of significant diagnostics from this hospitalization (including imaging, microbiology, ancillary and laboratory) are listed below for reference.     Microbiology: No results found for this or any previous visit (from the past 240 hours).   Labs: BNP (last 3 results) No results for input(s): "BNP" in the last 8760 hours. Basic Metabolic Panel: Recent Labs  Lab 03/28/24 1046  NA 141  K 4.3  CL 103  CO2 26  GLUCOSE 165*  BUN 13  CREATININE 1.01  CALCIUM  10.1   Liver Function Tests: No results for input(s): "AST", "ALT", "ALKPHOS", "BILITOT", "PROT", "ALBUMIN" in the last 168 hours. No results for input(s): "LIPASE", "AMYLASE" in the last 168 hours. No results for input(s): "AMMONIA" in the last 168 hours. CBC: Recent Labs  Lab 03/28/24 1046  WBC 9.5  HGB 12.5*  HCT 39.5  MCV 86.6  PLT 254   Cardiac Enzymes: No results for input(s): "CKTOTAL", "CKMB", "CKMBINDEX", "TROPONINI" in the last 168 hours. BNP: Invalid input(s): "POCBNP" CBG: Recent Labs  Lab 03/30/24 0616 03/30/24 1117 03/30/24 1635 03/30/24 2125 03/31/24 0618  GLUCAP 157* 168* 174* 197* 143*   D-Dimer No results for input(s): "DDIMER" in the last 72 hours. Hgb A1c No results for input(s): "HGBA1C" in the last 72 hours. Lipid Profile No results for input(s): "CHOL", "HDL", "LDLCALC", "TRIG", "CHOLHDL", "LDLDIRECT" in the last 72 hours. Thyroid  function studies No results for input(s): "TSH", "T4TOTAL", "T3FREE", "THYROIDAB" in the last 72 hours.  Invalid input(s): "FREET3" Anemia work up No results for input(s): "VITAMINB12", "FOLATE", "FERRITIN", "TIBC", "IRON ", "RETICCTPCT" in the last 72 hours. Urinalysis    Component Value Date/Time   COLORURINE RED (A) 03/30/2024 1124   APPEARANCEUR CLOUDY (A)  03/30/2024 1124   LABSPEC  03/30/2024 1124    TEST NOT REPORTED DUE TO COLOR INTERFERENCE OF URINE PIGMENT   PHURINE  03/30/2024 1124    TEST NOT REPORTED DUE TO COLOR INTERFERENCE OF URINE PIGMENT   GLUCOSEU (A) 03/30/2024 1124    TEST NOT REPORTED DUE TO COLOR INTERFERENCE OF URINE PIGMENT   HGBUR (A) 03/30/2024 1124    TEST NOT REPORTED DUE TO COLOR INTERFERENCE OF URINE PIGMENT   BILIRUBINUR (A) 03/30/2024 1124    TEST NOT REPORTED DUE TO COLOR INTERFERENCE OF URINE PIGMENT   KETONESUR (A) 03/30/2024 1124    TEST NOT REPORTED DUE TO COLOR INTERFERENCE OF URINE PIGMENT   PROTEINUR (A) 03/30/2024 1124    TEST NOT REPORTED DUE TO COLOR INTERFERENCE OF URINE PIGMENT   NITRITE (A) 03/30/2024 1124    TEST NOT REPORTED DUE TO COLOR INTERFERENCE OF URINE PIGMENT   LEUKOCYTESUR (A) 03/30/2024 1124    TEST NOT REPORTED DUE TO COLOR INTERFERENCE OF URINE PIGMENT   Sepsis Labs Recent Labs  Lab 03/28/24 1046  WBC 9.5   Microbiology No results found for this or any previous visit (from the past 240 hours).   Time coordinating discharge: 35 minutes  SIGNED:   Vada Garibaldi, MD  [facility] 03/31/2024, 10:53 AM

## 2024-03-31 NOTE — TOC Initial Note (Deleted)
 Transition of Care [facility]) - Initial/Assessment Note    Patient Details  Name: Jordan [name] MRN: [id number] Date of Birth: January 13, 1941  Transition [facility]) CM/SW Contact:    Jordan [name], [place] Phone Number: 03/31/2024, 11:54 AM  Clinical Narrative: Pt for dc to [facility] [name] today. Spoke to Jordan [name] who confirmed they are prepared to admit pt to room 120. Pt's wife aware of dc and reports agreeable. RN provided with number for report and PTAR arranged for transport. SW signing off at dc.   Jordan [name], MSW, LCSW [phone] (coverage)                    Expected Discharge Plan: Skilled Nursing Facility Barriers to Discharge: Barriers Resolved   Patient Goals and CMS Choice   CMS Medicare.gov Compare Post Acute Care list provided to:: Patient Represenative (must comment) Choice offered to / list presented to : Spouse      Expected Discharge Plan and Services   Discharge Planning Services: CM Consult Post Acute Care Choice: IP Rehab Living arrangements for the past 2 months: Single Family Home Expected Discharge Date: 03/31/24                                    Prior Living Arrangements/Services Living arrangements for the past 2 months: Single Family Home Lives with:: Spouse Patient language and need for interpreter reviewed:: Yes Do you feel safe going back to the place where you live?: Yes            Criminal Activity/Legal Involvement Pertinent to Current Situation/Hospitalization: No - Comment as needed  Activities of Daily Living   ADL Screening (condition at time of admission) Independently performs ADLs?: Yes (appropriate for developmental age) Does the patient have a NEW difficulty with bathing/dressing/toileting/self-feeding that is expected to last >3 days?: Yes (Initiates electronic notice to provider for possible OT consult) Does the patient have a NEW difficulty with getting in/out of bed, walking, or climbing stairs that is  expected to last >3 days?: Yes (Initiates electronic notice to provider for possible PT consult) Does the patient have a NEW difficulty with communication that is expected to last >3 days?: Yes (Initiates electronic notice to provider for possible SLP consult) Is the patient deaf or have difficulty hearing?: Yes Does the patient have difficulty seeing, even when wearing glasses/contacts?: Yes Does the patient have difficulty concentrating, remembering, or making decisions?: Yes  Permission Sought/Granted                  Emotional Assessment           Psych Involvement: No (comment)  Admission diagnosis:  Focal neurological deficit [R29.818] Stroke-like symptom [R29.90] Encephalopathy, unspecified type [G93.40] Patient Active Problem List   Diagnosis Date Noted   Stroke-like symptom 03/21/2024   Acute encephalopathy 02/25/2024   OSA on CPAP 02/25/2024   Symptomatic anemia 02/09/2024   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   History of DVT (deep vein thrombosis) 04/29/2022   Closed intertrochanteric fracture of left hip, initial encounter [facility])    Closed left hip fracture (HCC) secondary to fall at home 02/08/2022   Paroxysmal atrial fibrillation (HCC) 02/08/2022   Debility 01/08/2022   Constipation 01/07/2022   Physical deconditioning 01/07/2022   Acute metabolic encephalopathy 01/04/2022   Hypomagnesemia 01/04/2022   Pancreatitis 12/30/2021   Parkinson's disease (HCC) 12/21/2021   Parkinson disease (HCC) 12/18/2021  Frequent falls 12/18/2021   History of CVA (cerebrovascular accident) 12/18/2021   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   Hallucinations, visual 07/30/2021   Acetabulum fracture, left (HCC) 06/25/2021   Acute hip pain, left 06/24/2021   Fall at home, initial encounter 06/24/2021   Leukocytosis 06/24/2021   Chronic diastolic heart failure (HCC)    Sinusitis, acute maxillary 06/24/2020   AKI (acute kidney injury) (HCC)     Labile blood glucose    Cough    Benign essential HTN    Skin lesion of hand    Hypoalbuminemia due to protein-calorie malnutrition (HCC)    Controlled type 2 diabetes mellitus with hyperglycemia, without long-term current use of insulin  (HCC)    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   Morbid obesity (HCC)    Acute blood loss anemia    Acute thalamic infarction (HCC) 04/15/2020   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   Type 2 diabetes mellitus with hyperlipidemia (HCC) 02/17/2018   Insomnia 02/01/2018   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   Chronotropic incompetence 07/09/2014   Dyspnea on exertion 03/03/2014   HLD (hyperlipidemia) 12/13/2007   Essential hypertension 12/13/2007   Class 1 obesity 12/05/2007   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   Coronary atherosclerosis 12/05/2007   PCP:  Jordan Congo, MD Pharmacy:   [facility] [phone] Jordan [name], [place] - [address] AT [facility] [address] [place] [address] Phone: [phone] Fax: [phone]  [facility] - [place], [place] - [address] Jordan [name] [place] [address] Phone: [phone] Fax: [phone]     Social Drivers of Health (SDOH) Social History: SDOH Screenings   Food Insecurity: No Food Insecurity (03/22/2024)  Housing: Low Risk  (03/22/2024)  Transportation Needs: No Transportation Needs (03/22/2024)  Utilities: Not At Risk (03/22/2024)  Depression (PHQ2-9): Low Risk  (04/12/2023)  Social Connections: Socially Integrated (03/29/2024)  Tobacco Use: Medium Risk (03/21/2024)   SDOH Interventions:     Readmission Risk Interventions     No data to display

## 2024-03-31 NOTE — Plan of Care (Signed)
  Problem: Ischemic Stroke/TIA Tissue Perfusion: Goal: Complications of ischemic stroke/TIA will be minimized Outcome: Progressing   Problem: Coping: Goal: Will verbalize positive feelings about self Outcome: Progressing   Problem: Health Behavior/Discharge Planning: Goal: Goals will be collaboratively established with patient/family Outcome: Progressing   Problem: Self-Care: Goal: Ability to participate in self-care as condition permits will improve Outcome: Progressing Goal: Verbalization of feelings and concerns over difficulty with self-care will improve Outcome: Progressing Goal: Ability to communicate needs accurately will improve Outcome: Progressing   Problem: Nutrition: Goal: Risk of aspiration will decrease Outcome: Progressing Goal: Dietary intake will improve Outcome: Progressing   Problem: Coping: Goal: Ability to adjust to condition or change in health will improve Outcome: Progressing   Problem: Fluid Volume: Goal: Ability to maintain a balanced intake and output will improve Outcome: Progressing   Problem: Metabolic: Goal: Ability to maintain appropriate glucose levels will improve Outcome: Progressing   Problem: Nutritional: Goal: Maintenance of adequate nutrition will improve Outcome: Progressing Goal: Progress toward achieving an optimal weight will improve Outcome: Progressing   Problem: Skin Integrity: Goal: Risk for impaired skin integrity will decrease Outcome: Progressing   Problem: Tissue Perfusion: Goal: Adequacy of tissue perfusion will improve Outcome: Progressing   Problem: Education: Goal: Knowledge of General Education information will improve Description: Including pain rating scale, medication(s)/side effects and non-pharmacologic comfort measures Outcome: Progressing   Problem: Health Behavior/Discharge Planning: Goal: Ability to manage health-related needs will improve Outcome: Progressing   Problem: Clinical  Measurements: Goal: Ability to maintain clinical measurements within normal limits will improve Outcome: Progressing Goal: Will remain free from infection Outcome: Progressing Goal: Diagnostic test results will improve Outcome: Progressing Goal: Respiratory complications will improve Outcome: Progressing Goal: Cardiovascular complication will be avoided Outcome: Progressing   Problem: Activity: Goal: Risk for activity intolerance will decrease Outcome: Progressing   Problem: Nutrition: Goal: Adequate nutrition will be maintained Outcome: Progressing   Problem: Coping: Goal: Level of anxiety will decrease Outcome: Progressing   Problem: Elimination: Goal: Will not experience complications related to bowel motility Outcome: Progressing Goal: Will not experience complications related to urinary retention Outcome: Progressing   Problem: Pain Managment: Goal: General experience of comfort will improve and/or be controlled Outcome: Progressing   Problem: Safety: Goal: Ability to remain free from injury will improve Outcome: Progressing   Problem: Skin Integrity: Goal: Risk for impaired skin integrity will decrease Outcome: Progressing   Problem: Safety: Goal: Non-violent Restraint(s) Outcome: Progressing

## 2024-03-31 NOTE — Plan of Care (Signed)
 Problem: Education: Goal: Knowledge of disease or condition will improve 03/31/2024 0238 by Melville Stade, RN Outcome: Progressing 03/31/2024 0237 by Melville Stade, RN Outcome: Not Progressing Goal: Knowledge of secondary prevention will improve (MUST DOCUMENT ALL) 03/31/2024 0238 by Melville Stade, RN Outcome: Progressing 03/31/2024 0237 by Melville Stade, RN Outcome: Not Progressing Goal: Knowledge of patient specific risk factors will improve (DELETE if not current risk factor) 03/31/2024 0238 by Melville Stade, RN Outcome: Progressing 03/31/2024 0237 by Melville Stade, RN Outcome: Not Progressing   Problem: Ischemic Stroke/TIA Tissue Perfusion: Goal: Complications of ischemic stroke/TIA will be minimized 03/31/2024 0238 by Melville Stade, RN Outcome: Progressing 03/31/2024 0237 by Melville Stade, RN Outcome: Progressing   Problem: Coping: Goal: Will verbalize positive feelings about self 03/31/2024 0238 by Melville Stade, RN Outcome: Progressing 03/31/2024 0237 by Melville Stade, RN Outcome: Progressing Goal: Will identify appropriate support needs 03/31/2024 0238 by Melville Stade, RN Outcome: Progressing 03/31/2024 0237 by Melville Stade, RN Outcome: Not Progressing   Problem: Health Behavior/Discharge Planning: Goal: Ability to manage health-related needs will improve 03/31/2024 0238 by Melville Stade, RN Outcome: Progressing 03/31/2024 0237 by Melville Stade, RN Outcome: Not Progressing Goal: Goals will be collaboratively established with patient/family 03/31/2024 0238 by Melville Stade, RN Outcome: Progressing 03/31/2024 0237 by Melville Stade, RN Outcome: Progressing   Problem: Self-Care: Goal: Ability to participate in self-care as condition permits will improve 03/31/2024 0238 by Melville Stade, RN Outcome: Progressing 03/31/2024 0237 by Melville Stade, RN Outcome: Progressing Goal: Verbalization of feelings and concerns over difficulty with self-care will  improve 03/31/2024 0238 by Melville Stade, RN Outcome: Progressing 03/31/2024 0237 by Melville Stade, RN Outcome: Progressing Goal: Ability to communicate needs accurately will improve 03/31/2024 0238 by Melville Stade, RN Outcome: Progressing 03/31/2024 0237 by Melville Stade, RN Outcome: Progressing   Problem: Nutrition: Goal: Risk of aspiration will decrease 03/31/2024 0238 by Melville Stade, RN Outcome: Progressing 03/31/2024 0237 by Melville Stade, RN Outcome: Progressing Goal: Dietary intake will improve 03/31/2024 0238 by Melville Stade, RN Outcome: Progressing 03/31/2024 0237 by Melville Stade, RN Outcome: Progressing   Problem: Education: Goal: Ability to describe self-care measures that may prevent or decrease complications (Diabetes Survival Skills Education) will improve 03/31/2024 0238 by Melville Stade, RN Outcome: Progressing 03/31/2024 0237 by Melville Stade, RN Outcome: Not Progressing Goal: Individualized Educational Video(s) 03/31/2024 0238 by Melville Stade, RN Outcome: Progressing 03/31/2024 0237 by Melville Stade, RN Outcome: Not Progressing   Problem: Coping: Goal: Ability to adjust to condition or change in health will improve 03/31/2024 0238 by Melville Stade, RN Outcome: Progressing 03/31/2024 0237 by Melville Stade, RN Outcome: Progressing   Problem: Fluid Volume: Goal: Ability to maintain a balanced intake and output will improve 03/31/2024 0238 by Melville Stade, RN Outcome: Progressing 03/31/2024 0237 by Melville Stade, RN Outcome: Progressing   Problem: Health Behavior/Discharge Planning: Goal: Ability to identify and utilize available resources and services will improve 03/31/2024 0238 by Melville Stade, RN Outcome: Progressing 03/31/2024 0237 by Melville Stade, RN Outcome: Not Progressing Goal: Ability to manage health-related needs will improve 03/31/2024 0238 by Melville Stade, RN Outcome: Progressing 03/31/2024 0237 by Melville Stade,  RN Outcome: Not Progressing   Problem: Metabolic: Goal: Ability to maintain appropriate glucose levels will improve 03/31/2024 0238 by Melville Stade, RN Outcome: Progressing 03/31/2024 0237 by Melville Stade, RN Outcome: Progressing   Problem: Nutritional: Goal: Maintenance of adequate nutrition will improve 03/31/2024 0238 by Melville Stade, RN Outcome: Progressing 03/31/2024 0237 by Melville Stade, RN Outcome: Progressing Goal: Progress toward achieving an optimal  weight will improve 03/31/2024 0238 by Melville Stade, RN Outcome: Progressing 03/31/2024 0237 by Melville Stade, RN Outcome: Progressing   Problem: Skin Integrity: Goal: Risk for impaired skin integrity will decrease 03/31/2024 0238 by Melville Stade, RN Outcome: Progressing 03/31/2024 0237 by Melville Stade, RN Outcome: Progressing   Problem: Tissue Perfusion: Goal: Adequacy of tissue perfusion will improve 03/31/2024 0238 by Melville Stade, RN Outcome: Progressing 03/31/2024 0237 by Melville Stade, RN Outcome: Progressing   Problem: Education: Goal: Knowledge of General Education information will improve Description: Including pain rating scale, medication(s)/side effects and non-pharmacologic comfort measures 03/31/2024 0238 by Melville Stade, RN Outcome: Progressing 03/31/2024 0237 by Melville Stade, RN Outcome: Progressing   Problem: Health Behavior/Discharge Planning: Goal: Ability to manage health-related needs will improve 03/31/2024 0238 by Melville Stade, RN Outcome: Progressing 03/31/2024 0237 by Melville Stade, RN Outcome: Progressing   Problem: Clinical Measurements: Goal: Ability to maintain clinical measurements within normal limits will improve 03/31/2024 0238 by Melville Stade, RN Outcome: Progressing 03/31/2024 0237 by Melville Stade, RN Outcome: Progressing Goal: Will remain free from infection 03/31/2024 0238 by Melville Stade, RN Outcome: Progressing 03/31/2024 0237 by Melville Stade,  RN Outcome: Progressing Goal: Diagnostic test results will improve 03/31/2024 0238 by Melville Stade, RN Outcome: Progressing 03/31/2024 0237 by Melville Stade, RN Outcome: Progressing Goal: Respiratory complications will improve 03/31/2024 0238 by Melville Stade, RN Outcome: Progressing 03/31/2024 0237 by Melville Stade, RN Outcome: Progressing Goal: Cardiovascular complication will be avoided 03/31/2024 0238 by Melville Stade, RN Outcome: Progressing 03/31/2024 0237 by Melville Stade, RN Outcome: Progressing   Problem: Activity: Goal: Risk for activity intolerance will decrease 03/31/2024 0238 by Melville Stade, RN Outcome: Progressing 03/31/2024 0237 by Melville Stade, RN Outcome: Progressing   Problem: Nutrition: Goal: Adequate nutrition will be maintained 03/31/2024 0238 by Melville Stade, RN Outcome: Progressing 03/31/2024 0237 by Melville Stade, RN Outcome: Progressing   Problem: Coping: Goal: Level of anxiety will decrease 03/31/2024 0238 by Melville Stade, RN Outcome: Progressing 03/31/2024 0237 by Melville Stade, RN Outcome: Progressing   Problem: Elimination: Goal: Will not experience complications related to bowel motility 03/31/2024 0238 by Melville Stade, RN Outcome: Progressing 03/31/2024 0237 by Melville Stade, RN Outcome: Progressing Goal: Will not experience complications related to urinary retention 03/31/2024 0238 by Melville Stade, RN Outcome: Progressing 03/31/2024 0237 by Melville Stade, RN Outcome: Progressing   Problem: Pain Managment: Goal: General experience of comfort will improve and/or be controlled 03/31/2024 0238 by Melville Stade, RN Outcome: Progressing 03/31/2024 0237 by Melville Stade, RN Outcome: Progressing   Problem: Safety: Goal: Ability to remain free from injury will improve 03/31/2024 0238 by Melville Stade, RN Outcome: Progressing 03/31/2024 0237 by Melville Stade, RN Outcome: Progressing   Problem: Skin Integrity: Goal: Risk  for impaired skin integrity will decrease 03/31/2024 0238 by Melville Stade, RN Outcome: Progressing 03/31/2024 0237 by Melville Stade, RN Outcome: Progressing   Problem: Safety: Goal: Non-violent Restraint(s) 03/31/2024 0238 by Melville Stade, RN Outcome: Progressing 03/31/2024 0237 by Melville Stade, RN Outcome: Progressing

## 2024-03-31 NOTE — TOC Transition Note (Signed)
 Transition of Care [facility]) - Discharge Note   Patient Details  Name: Jordan [name] MRN: [id number] Date of Birth: 02/11/1941  Transition of Care [facility] CM/SW Contact:  Jeffory Mings, [place] Phone Number: 03/31/2024, 12:00 PM   Clinical Narrative:   Pt for dc to [name] today. Spoke to [facility] who confirmed they are prepared to admit pt to room 120. Pt's wife aware of dc and reports agreeable. RN provided with number for report and [facility] arranged for transport. SW signing off at dc.   Paullette Boston, MSW, LCSW [phone] (coverage)       Final next level of care: Skilled Nursing Facility Barriers to Discharge: Barriers Resolved   Patient Goals and CMS Choice   CMS Medicare.gov Compare Post Acute Care list provided to:: Patient Represenative (must comment) Choice offered to / list presented to : Spouse      Discharge Placement                       Discharge Plan and Services Additional resources added to the After Visit Summary for     Discharge Planning Services: CM Consult Post Acute Care Choice: IP Rehab                               Social Drivers of Health (SDOH) Interventions SDOH Screenings   Food Insecurity: No Food Insecurity (03/22/2024)  Housing: Low Risk  (03/22/2024)  Transportation Needs: No Transportation Needs (03/22/2024)  Utilities: Not At Risk (03/22/2024)  Depression (PHQ2-9): Low Risk  (04/12/2023)  Social Connections: Socially Integrated (03/29/2024)  Tobacco Use: Medium Risk (03/21/2024)     Readmission Risk Interventions     No data to display

## 2024-03-31 NOTE — Progress Notes (Signed)
 Report called to Maximo Spar RN

## 2024-04-02 ENCOUNTER — Ambulatory Visit: Payer: Medicare (Managed Care) | Admitting: Urology

## 2024-04-02 DIAGNOSIS — Z7901 Long term (current) use of anticoagulants: Secondary | ICD-10-CM | POA: Diagnosis not present

## 2024-04-02 DIAGNOSIS — G9340 Encephalopathy, unspecified: Secondary | ICD-10-CM | POA: Diagnosis not present

## 2024-04-02 DIAGNOSIS — I4891 Unspecified atrial fibrillation: Secondary | ICD-10-CM | POA: Diagnosis not present

## 2024-04-02 DIAGNOSIS — D6859 Other primary thrombophilia: Secondary | ICD-10-CM | POA: Diagnosis not present

## 2024-04-02 DIAGNOSIS — C7B8 Other secondary neuroendocrine tumors: Secondary | ICD-10-CM | POA: Diagnosis not present

## 2024-04-02 DIAGNOSIS — G4733 Obstructive sleep apnea (adult) (pediatric): Secondary | ICD-10-CM | POA: Diagnosis not present

## 2024-04-02 DIAGNOSIS — E119 Type 2 diabetes mellitus without complications: Secondary | ICD-10-CM | POA: Diagnosis not present

## 2024-04-02 DIAGNOSIS — I495 Sick sinus syndrome: Secondary | ICD-10-CM | POA: Diagnosis not present

## 2024-04-02 DIAGNOSIS — E785 Hyperlipidemia, unspecified: Secondary | ICD-10-CM | POA: Diagnosis not present

## 2024-04-02 DIAGNOSIS — G20A1 Parkinson's disease without dyskinesia, without mention of fluctuations: Secondary | ICD-10-CM | POA: Diagnosis not present

## 2024-04-02 DIAGNOSIS — I679 Cerebrovascular disease, unspecified: Secondary | ICD-10-CM | POA: Diagnosis not present

## 2024-04-02 DIAGNOSIS — R1310 Dysphagia, unspecified: Secondary | ICD-10-CM | POA: Diagnosis not present

## 2024-04-03 ENCOUNTER — Ambulatory Visit: Payer: Medicare (Managed Care) | Admitting: Adult Health

## 2024-04-04 DIAGNOSIS — L89152 Pressure ulcer of sacral region, stage 2: Secondary | ICD-10-CM | POA: Diagnosis not present

## 2024-04-04 DIAGNOSIS — G20A1 Parkinson's disease without dyskinesia, without mention of fluctuations: Secondary | ICD-10-CM | POA: Diagnosis not present

## 2024-04-04 DIAGNOSIS — F0280 Dementia in other diseases classified elsewhere without behavioral disturbance: Secondary | ICD-10-CM | POA: Diagnosis not present

## 2024-04-04 DIAGNOSIS — Z7901 Long term (current) use of anticoagulants: Secondary | ICD-10-CM | POA: Diagnosis not present

## 2024-04-04 DIAGNOSIS — I679 Cerebrovascular disease, unspecified: Secondary | ICD-10-CM | POA: Diagnosis not present

## 2024-04-04 DIAGNOSIS — Z950 Presence of cardiac pacemaker: Secondary | ICD-10-CM | POA: Diagnosis not present

## 2024-04-04 DIAGNOSIS — Z7984 Long term (current) use of oral hypoglycemic drugs: Secondary | ICD-10-CM | POA: Diagnosis not present

## 2024-04-04 DIAGNOSIS — I480 Paroxysmal atrial fibrillation: Secondary | ICD-10-CM | POA: Diagnosis not present

## 2024-04-04 DIAGNOSIS — E119 Type 2 diabetes mellitus without complications: Secondary | ICD-10-CM | POA: Diagnosis not present

## 2024-04-06 ENCOUNTER — Encounter
Payer: Medicare (Managed Care) | Attending: Physical Medicine and Rehabilitation | Admitting: Physical Medicine and Rehabilitation

## 2024-04-09 DIAGNOSIS — F03911 Unspecified dementia, unspecified severity, with agitation: Secondary | ICD-10-CM | POA: Diagnosis not present

## 2024-04-09 DIAGNOSIS — G9340 Encephalopathy, unspecified: Secondary | ICD-10-CM | POA: Diagnosis not present

## 2024-04-09 DIAGNOSIS — G20A1 Parkinson's disease without dyskinesia, without mention of fluctuations: Secondary | ICD-10-CM | POA: Diagnosis not present

## 2024-04-09 DIAGNOSIS — I10 Essential (primary) hypertension: Secondary | ICD-10-CM | POA: Diagnosis not present

## 2024-04-09 DIAGNOSIS — F0392 Unspecified dementia, unspecified severity, with psychotic disturbance: Secondary | ICD-10-CM | POA: Diagnosis not present

## 2024-04-09 DIAGNOSIS — Z789 Other specified health status: Secondary | ICD-10-CM | POA: Diagnosis not present

## 2024-04-09 DIAGNOSIS — I951 Orthostatic hypotension: Secondary | ICD-10-CM | POA: Diagnosis not present

## 2024-04-09 DIAGNOSIS — G4720 Circadian rhythm sleep disorder, unspecified type: Secondary | ICD-10-CM | POA: Diagnosis not present

## 2024-04-10 ENCOUNTER — Ambulatory Visit: Payer: Medicare (Managed Care) | Admitting: Nurse Practitioner

## 2024-04-10 ENCOUNTER — Other Ambulatory Visit: Payer: Medicare (Managed Care)

## 2024-04-11 ENCOUNTER — Inpatient Hospital Stay: Payer: Medicare (Managed Care)

## 2024-04-11 ENCOUNTER — Inpatient Hospital Stay: Payer: Medicare (Managed Care) | Admitting: Nurse Practitioner

## 2024-04-11 NOTE — Progress Notes (Incomplete)
 Chief Complaint: No chief complaint on file.   History of Present Illness:  Jordan [name] is a 83 y.o. male who is seen in consultation from Roselind Congo, MD for evaluation of a left renal calculus.   Past Medical History:  Past Medical History:  Diagnosis Date   Arthritis    R shoulder, bone spur   Arthritis    hands (03/25/2016)   Chronic diastolic heart failure (HCC)    Chronic lower back pain    Coronary heart disease    Dr Ollie Bhat   Diverticulitis 12/2021   DVT (deep venous thrombosis) (HCC) 05/2022   left leg   H/O cardiovascular stress test    perhaps last one was 2009   Heart murmur    12/29/11 echo: mild MR, no AS, trivial TR   History of gout    Hyperlipidemia    Hypertension    saw Dr. Pauletta Boroughs last earl;y- 2014, cardiac cath. last done ?2009, blocks seen didn't require any intervention at that point.   Narcolepsy    [name] 04-11-97; Mean Latency 1.24min, SOREM 2   OSA on CPAP    NPSG 12-13-98 AHI 22.7   PONV (postoperative nausea and vomiting)    Presence of permanent cardiac pacemaker    Shortness of breath    with exertion    Stroke (HCC)    Type II diabetes mellitus (HCC)     Past Surgical History:  Past Surgical History:  Procedure Laterality Date   CARDIAC CATHETERIZATION  2009?   EP IMPLANTABLE DEVICE N/A 03/25/2016   Procedure: Pacemaker Implant - Dual Chamber;  Surgeon: Tammie Fall, MD;  [facility] INVASIVE CV LAB;  Service: Cardiovascular;  Laterality: N/A;   FRACTURE SURGERY     HIP FRACTURE SURGERY     INSERT / REPLACE / REMOVE PACEMAKER  03/24/2016   INTRAMEDULLARY (IM) NAIL INTERTROCHANTERIC Left 02/08/2022   Procedure: INTRAMEDULLARY (IM) NAIL INTERTROCHANTRIC;  Surgeon: Jasmine Mesi, MD;  Location: [facility] OR;  Service: Orthopedics;  Laterality: Left;   IR KYPHO LUMBAR INC FX REDUCE BONE BX UNI/BIL CANNULATION INC/IMAGING  12/29/2021   IR RADIOLOGIST EVAL & MGMT  12/15/2021   IR RADIOLOGY PERIPHERAL GUIDED IV START   07/14/2022   IR US  GUIDE VASC ACCESS RIGHT  07/15/2022   LEFT AND RIGHT HEART CATHETERIZATION WITH CORONARY ANGIOGRAM N/A 06/05/2014   Procedure: LEFT AND RIGHT HEART CATHETERIZATION WITH CORONARY ANGIOGRAM;  Surgeon: Mickiel Albany, MD;  Location: [facility] CATH LAB;  Service: Cardiovascular;  Laterality: N/A;   NASAL FRACTURE SURGERY     SHOULDER ARTHROSCOPY WITH ROTATOR CUFF REPAIR AND SUBACROMIAL DECOMPRESSION Right 08/16/2013   Procedure: SHOULDER ARTHROSCOPY WITH ROTATOR CUFF REPAIR AND SUBACROMIAL DECOMPRESSION;  Surgeon: Derald Flattery, MD;  Location: [facility] OR;  Service: Orthopedics;  Laterality: Right;  Right shoulder arthroscopy rotator cuff repair, subacromial decompression.   TONSILLECTOMY      Allergies:  Allergies  Allergen Reactions   Amantadines Other (See Comments)    Mental changes   Clonazepam  Other (See Comments)    Mental changes   Lisinopril  Other (See Comments)    Unknown reaction   Zoloft  [Sertraline ] Other (See Comments)    Mental changes    Family History:  Family History  Problem Relation Age of Onset   Sleep apnea Sister    Colon cancer Sister     Social History:  Social History   Tobacco Use   Smoking status: Former    Current  packs/day: 0.00    Average packs/day: 2.5 packs/day for 6.0 years (15.0 ttl pk-yrs)    Types: Cigarettes    Start date: 06/04/1961    Quit date: 06/05/1967    Years since quitting: 56.8   Smokeless tobacco: Never  Vaping Use   Vaping status: Never Used  Substance Use Topics   Alcohol  use: Yes    Comment: rare   Drug use: No    Review of symptoms:  Constitutional:  Negative for unexplained weight loss, night sweats, fever, chills ENT:  Negative for nose bleeds, sinus pain, painful swallowing CV:  Negative for chest pain, shortness of breath, exercise intolerance, palpitations, loss of consciousness Resp:  Negative for cough, wheezing, shortness of breath GI:  Negative for nausea, vomiting, diarrhea, bloody  stools GU:  Positives noted in HPI; otherwise negative for gross hematuria, dysuria, urinary incontinence Neuro:  Negative for seizures, poor balance, limb weakness, slurred speech Psych:  Negative for lack of energy, depression, anxiety Endocrine:  Negative for polydipsia, polyuria, symptoms of hypoglycemia (dizziness, hunger, sweating) Hematologic:  Negative for anemia, purpura, petechia, prolonged or excessive bleeding, use of anticoagulants  Allergic:  Negative for difficulty breathing or choking as a result of exposure to anything; no shellfish allergy; no allergic response (rash/itch) to materials, foods  Physical exam: There were no vitals taken for this visit. GENERAL APPEARANCE:  Well appearing, well developed, well nourished, NAD HEENT: Atraumatic, Normocephalic. NECK: Normal appearance LUNGS: Normal inspiratory and expiratory excursion HEART: Regular Rate ABDOMEN: ***. GU: Phallus normal, no lesions. Scrotal skin normal. Testicles/epididymal structures normal. Meatus normal. Normal anal sphincter tone, prostate ***mL, symmetric, non nodular, non tender. EXTREMITIES: Moves all extremities well.  Without clubbing, cyanosis, or edema. NEUROLOGIC:  Alert and oriented x 3, normal gait, CN II-XII grossly intact.  MENTAL STATUS:  Appropriate. SKIN:  Warm, dry and intact.    Results: No results found for this or any previous visit (from the past 24 hours).  I have reviewed referring/prior physicians notes  I have reviewed urinalysis  I have reviewed PSA results  I have reviewed prior imaging--CT images.  Skin to stone distance 11.7 cm, Hounsfield units 850  I have reviewed urine culture results  Assessment: ***   Plan: *** -

## 2024-04-12 ENCOUNTER — Encounter: Payer: Self-pay | Admitting: Hematology

## 2024-04-16 ENCOUNTER — Ambulatory Visit: Payer: Medicare (Managed Care) | Admitting: Urology

## 2024-04-16 DIAGNOSIS — N200 Calculus of kidney: Secondary | ICD-10-CM

## 2024-04-18 ENCOUNTER — Encounter: Payer: Self-pay | Admitting: Adult Health

## 2024-04-18 DIAGNOSIS — F5102 Adjustment insomnia: Secondary | ICD-10-CM | POA: Diagnosis not present

## 2024-04-18 DIAGNOSIS — F03B2 Unspecified dementia, moderate, with psychotic disturbance: Secondary | ICD-10-CM | POA: Diagnosis not present

## 2024-04-18 DIAGNOSIS — G4752 REM sleep behavior disorder: Secondary | ICD-10-CM | POA: Diagnosis not present

## 2024-04-23 ENCOUNTER — Institutional Professional Consult (permissible substitution) (INDEPENDENT_AMBULATORY_CARE_PROVIDER_SITE_OTHER): Admitting: Otolaryngology

## 2024-04-24 DIAGNOSIS — R400 Somnolence: Secondary | ICD-10-CM | POA: Diagnosis not present

## 2024-04-25 ENCOUNTER — Ambulatory Visit: Payer: Medicare (Managed Care) | Admitting: Podiatry

## 2024-04-25 DIAGNOSIS — G20A1 Parkinson's disease without dyskinesia, without mention of fluctuations: Secondary | ICD-10-CM | POA: Diagnosis not present

## 2024-04-25 DIAGNOSIS — G4720 Circadian rhythm sleep disorder, unspecified type: Secondary | ICD-10-CM | POA: Diagnosis not present

## 2024-04-25 DIAGNOSIS — R5383 Other fatigue: Secondary | ICD-10-CM | POA: Diagnosis not present

## 2024-04-25 DIAGNOSIS — C7B8 Other secondary neuroendocrine tumors: Secondary | ICD-10-CM | POA: Diagnosis not present

## 2024-04-25 DIAGNOSIS — G4733 Obstructive sleep apnea (adult) (pediatric): Secondary | ICD-10-CM | POA: Diagnosis not present

## 2024-04-25 DIAGNOSIS — R197 Diarrhea, unspecified: Secondary | ICD-10-CM | POA: Diagnosis not present

## 2024-04-25 DIAGNOSIS — R0789 Other chest pain: Secondary | ICD-10-CM | POA: Diagnosis not present

## 2024-04-25 DIAGNOSIS — Z9989 Dependence on other enabling machines and devices: Secondary | ICD-10-CM | POA: Diagnosis not present

## 2024-04-25 DIAGNOSIS — E119 Type 2 diabetes mellitus without complications: Secondary | ICD-10-CM | POA: Diagnosis not present

## 2024-04-25 DIAGNOSIS — F02C2 Dementia in other diseases classified elsewhere, severe, with psychotic disturbance: Secondary | ICD-10-CM | POA: Diagnosis not present

## 2024-04-25 NOTE — Progress Notes (Incomplete)
 Chief Complaint: No chief complaint on file.   History of Present Illness:  Jordan [name] is a 83 y.o. male who is seen in consultation from [name], MD for evaluation of a left renal calculus.   Past Medical History:  Past Medical History:  Diagnosis Date   Arthritis    R shoulder, bone spur   Arthritis    hands (03/25/2016)   Chronic diastolic heart failure (HCC)    Chronic lower back pain    Coronary heart disease    Dr Esmeralda Sharps   Diverticulitis 12/2021   DVT (deep venous thrombosis) (HCC) 05/2022   left leg   H/O cardiovascular stress test    perhaps last one was 2009   Heart murmur    12/29/11 echo: mild MR, no AS, trivial TR   History of gout    Hyperlipidemia    Hypertension    saw Dr. HILARIO Sharps last earl;y- 2014, cardiac cath. last done ?2009, blocks seen didn't require any intervention at that point.   Narcolepsy    [name] 04-11-97; Mean Latency 1.21min, SOREM 2   OSA on CPAP    NPSG 12-13-98 AHI 22.7   PONV (postoperative nausea and vomiting)    Presence of permanent cardiac pacemaker    Shortness of breath    with exertion    Stroke (HCC)    Type II diabetes mellitus (HCC)     Past Surgical History:  Past Surgical History:  Procedure Laterality Date   CARDIAC CATHETERIZATION  2009?   EP IMPLANTABLE DEVICE N/A 03/25/2016   Procedure: Pacemaker Implant - Dual Chamber;  Surgeon: Danelle LELON Birmingham, MD;  Location: [facility] INVASIVE CV LAB;  Service: Cardiovascular;  Laterality: N/A;   FRACTURE SURGERY     HIP FRACTURE SURGERY     INSERT / REPLACE / REMOVE PACEMAKER  03/24/2016   INTRAMEDULLARY (IM) NAIL INTERTROCHANTERIC Left 02/08/2022   Procedure: INTRAMEDULLARY (IM) NAIL INTERTROCHANTRIC;  Surgeon: Addie Cordella Hamilton, MD;  Location: [facility] OR;  Service: Orthopedics;  Laterality: Left;   IR KYPHO LUMBAR INC FX REDUCE BONE BX UNI/BIL CANNULATION INC/IMAGING  12/29/2021   IR RADIOLOGIST EVAL & MGMT  12/15/2021   IR RADIOLOGY PERIPHERAL GUIDED IV START   07/14/2022   IR US  GUIDE VASC ACCESS RIGHT  07/15/2022   LEFT AND RIGHT HEART CATHETERIZATION WITH CORONARY ANGIOGRAM N/A 06/05/2014   Procedure: LEFT AND RIGHT HEART CATHETERIZATION WITH CORONARY ANGIOGRAM;  Surgeon: Victory LELON Sharps DOUGLAS, MD;  Location: [facility] CATH LAB;  Service: Cardiovascular;  Laterality: N/A;   NASAL FRACTURE SURGERY     SHOULDER ARTHROSCOPY WITH ROTATOR CUFF REPAIR AND SUBACROMIAL DECOMPRESSION Right 08/16/2013   Procedure: SHOULDER ARTHROSCOPY WITH ROTATOR CUFF REPAIR AND SUBACROMIAL DECOMPRESSION;  Surgeon: Eva Elsie Herring, MD;  Location: [facility] OR;  Service: Orthopedics;  Laterality: Right;  Right shoulder arthroscopy rotator cuff repair, subacromial decompression.   TONSILLECTOMY      Allergies:  Allergies  Allergen Reactions   Amantadines Other (See Comments)    Mental changes   Clonazepam  Other (See Comments)    Mental changes   Lisinopril  Other (See Comments)    Unknown reaction   Zoloft  [Sertraline ] Other (See Comments)    Mental changes    Family History:  Family History  Problem Relation Age of Onset   Sleep apnea Sister    Colon cancer Sister     Social History:  Social History   Tobacco Use   Smoking status: Former    Current  packs/day: 0.00    Average packs/day: 2.5 packs/day for 6.0 years (15.0 ttl pk-yrs)    Types: Cigarettes    Start date: 06/04/1961    Quit date: 06/05/1967    Years since quitting: 56.9   Smokeless tobacco: Never  Vaping Use   Vaping status: Never Used  Substance Use Topics   Alcohol  use: Yes    Comment: rare   Drug use: No    Review of symptoms:  Constitutional:  Negative for unexplained weight loss, night sweats, fever, chills ENT:  Negative for nose bleeds, sinus pain, painful swallowing CV:  Negative for chest pain, shortness of breath, exercise intolerance, palpitations, loss of consciousness Resp:  Negative for cough, wheezing, shortness of breath GI:  Negative for nausea, vomiting, diarrhea, bloody  stools GU:  Positives noted in HPI; otherwise negative for gross hematuria, dysuria, urinary incontinence Neuro:  Negative for seizures, poor balance, limb weakness, slurred speech Psych:  Negative for lack of energy, depression, anxiety Endocrine:  Negative for polydipsia, polyuria, symptoms of hypoglycemia (dizziness, hunger, sweating) Hematologic:  Negative for anemia, purpura, petechia, prolonged or excessive bleeding, use of anticoagulants  Allergic:  Negative for difficulty breathing or choking as a result of exposure to anything; no shellfish allergy; no allergic response (rash/itch) to materials, foods  Physical exam: There were no vitals taken for this visit. GENERAL APPEARANCE:  Well appearing, well developed, well nourished, NAD HEENT: Atraumatic, Normocephalic. NECK: Normal appearance LUNGS: Normal inspiratory and expiratory excursion HEART: Regular Rate ABDOMEN: ***. GU: Phallus normal, no lesions. Scrotal skin normal. Testicles/epididymal structures normal. Meatus normal. Normal anal sphincter tone, prostate ***mL, symmetric, non nodular, non tender. EXTREMITIES: Moves all extremities well.  Without clubbing, cyanosis, or edema. NEUROLOGIC:  Alert and oriented x 3, normal gait, CN II-XII grossly intact.  MENTAL STATUS:  Appropriate. SKIN:  Warm, dry and intact.    Results: No results found for this or any previous visit (from the past 24 hours).  I have reviewed referring/prior physicians notes  I have reviewed urinalysis  I have reviewed PSA results  I have reviewed prior imaging--CT images.  Skin to stone distance 11.7 cm, Hounsfield units 850  I have reviewed urine culture results  Assessment: ***   Plan: *** -

## 2024-04-26 DIAGNOSIS — G20A1 Parkinson's disease without dyskinesia, without mention of fluctuations: Secondary | ICD-10-CM | POA: Diagnosis not present

## 2024-04-26 DIAGNOSIS — Z9989 Dependence on other enabling machines and devices: Secondary | ICD-10-CM | POA: Diagnosis not present

## 2024-04-26 DIAGNOSIS — M940 Chondrocostal junction syndrome [Tietze]: Secondary | ICD-10-CM | POA: Diagnosis not present

## 2024-04-26 DIAGNOSIS — R5383 Other fatigue: Secondary | ICD-10-CM | POA: Diagnosis not present

## 2024-04-26 DIAGNOSIS — F0282 Dementia in other diseases classified elsewhere, unspecified severity, with psychotic disturbance: Secondary | ICD-10-CM | POA: Diagnosis not present

## 2024-04-26 DIAGNOSIS — C7B8 Other secondary neuroendocrine tumors: Secondary | ICD-10-CM | POA: Diagnosis not present

## 2024-04-26 DIAGNOSIS — G4733 Obstructive sleep apnea (adult) (pediatric): Secondary | ICD-10-CM | POA: Diagnosis not present

## 2024-04-26 DIAGNOSIS — C801 Malignant (primary) neoplasm, unspecified: Secondary | ICD-10-CM | POA: Diagnosis not present

## 2024-04-26 DIAGNOSIS — R197 Diarrhea, unspecified: Secondary | ICD-10-CM | POA: Diagnosis not present

## 2024-04-26 DIAGNOSIS — E119 Type 2 diabetes mellitus without complications: Secondary | ICD-10-CM | POA: Diagnosis not present

## 2024-04-26 DIAGNOSIS — G4720 Circadian rhythm sleep disorder, unspecified type: Secondary | ICD-10-CM | POA: Diagnosis not present

## 2024-04-30 ENCOUNTER — Ambulatory Visit: Payer: Medicare (Managed Care) | Admitting: Urology

## 2024-04-30 ENCOUNTER — Ambulatory Visit (INDEPENDENT_AMBULATORY_CARE_PROVIDER_SITE_OTHER): Payer: Medicare (Managed Care) | Admitting: Urology

## 2024-04-30 VITALS — BP 102/68 | HR 70 | Ht 69.0 in | Wt 197.0 lb

## 2024-04-30 DIAGNOSIS — N200 Calculus of kidney: Secondary | ICD-10-CM

## 2024-04-30 DIAGNOSIS — N400 Enlarged prostate without lower urinary tract symptoms: Secondary | ICD-10-CM | POA: Diagnosis not present

## 2024-04-30 DIAGNOSIS — R82998 Other abnormal findings in urine: Secondary | ICD-10-CM | POA: Diagnosis not present

## 2024-04-30 DIAGNOSIS — N201 Calculus of ureter: Secondary | ICD-10-CM

## 2024-04-30 DIAGNOSIS — R310 Gross hematuria: Secondary | ICD-10-CM

## 2024-04-30 DIAGNOSIS — R319 Hematuria, unspecified: Secondary | ICD-10-CM | POA: Diagnosis not present

## 2024-04-30 LAB — BLADDER SCAN AMB NON-IMAGING: Scan Result: 192

## 2024-04-30 NOTE — Progress Notes (Signed)
 Chief Complaint: No chief complaint on file.   History of Present Illness:  Jordan [name] is a 83 y.o. male who is seen in consultation from [name], MD for evaluation of a left renal calculus.  The patient was a walk-in today.  Unfortunately, he has a long medical history, I was unable to thoroughly review due to him showing up so quickly.  He does have a history of gross hematuria.  He was admitted to the hospital in March for failure to thrive with advanced Parkinson's disease with cognitive deficit.  The patient is in a skilled care facility at this time, at [name].  He is wheelchair confined.  He has been followed at [facility] for his left renal calculi as well as BPH and hematuria.  He currently has no complaints of left-sided flank pain.  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  CT scan from April 3 of this year also revealed 2 small left lower ureteral stones.  There was no evident hydronephrosis.  He passes most of his urine in a diaper.  He also has some fecal incontinence.   Past Medical History:  Past Medical History:  Diagnosis Date   Arthritis    R shoulder, bone spur   Arthritis    hands (03/25/2016)   Chronic diastolic heart failure (HCC)    Chronic lower back pain    Coronary heart disease    Dr Esmeralda Sharps   Diverticulitis 12/2021   DVT (deep venous thrombosis) (HCC) 05/2022   left leg   H/O cardiovascular stress test    perhaps last one was 2009   Heart murmur    12/29/11 echo: mild MR, no AS, trivial TR   History of gout    Hyperlipidemia    Hypertension    saw Dr. HILARIO Sharps last earl;y- 2014, cardiac cath. last done ?2009, blocks seen didn't require any intervention at that point.   Narcolepsy    [name] 04-11-97; Mean Latency 1.27min, SOREM 2   OSA on CPAP    NPSG 12-13-98 AHI 22.7   PONV (postoperative nausea and vomiting)    Presence of permanent cardiac pacemaker    Shortness of breath    with exertion    Stroke (HCC)     Type II diabetes mellitus (HCC)     Past Surgical History:  Past Surgical History:  Procedure Laterality Date   CARDIAC CATHETERIZATION  2009?   EP IMPLANTABLE DEVICE N/A 03/25/2016   Procedure: Pacemaker Implant - Dual Chamber;  Surgeon: Danelle LELON Birmingham, MD;  Location: [facility] INVASIVE CV LAB;  Service: Cardiovascular;  Laterality: N/A;   FRACTURE SURGERY     HIP FRACTURE SURGERY     INSERT / REPLACE / REMOVE PACEMAKER  03/24/2016   INTRAMEDULLARY (IM) NAIL INTERTROCHANTERIC Left 02/08/2022   Procedure: INTRAMEDULLARY (IM) NAIL INTERTROCHANTRIC;  Surgeon: Addie Cordella Hamilton, MD;  Location: [facility] OR;  Service: Orthopedics;  Laterality: Left;   IR KYPHO LUMBAR INC FX REDUCE BONE BX UNI/BIL CANNULATION INC/IMAGING  12/29/2021   IR RADIOLOGIST EVAL & MGMT  12/15/2021   IR RADIOLOGY PERIPHERAL GUIDED IV START  07/14/2022   IR US  GUIDE VASC ACCESS RIGHT  07/15/2022   LEFT AND RIGHT HEART CATHETERIZATION WITH CORONARY ANGIOGRAM N/A 06/05/2014   Procedure: LEFT AND RIGHT HEART CATHETERIZATION WITH CORONARY ANGIOGRAM;  Surgeon: Victory LELON Sharps DOUGLAS, MD;  Location: [facility] CATH LAB;  Service: Cardiovascular;  Laterality: N/A;   NASAL FRACTURE SURGERY  SHOULDER ARTHROSCOPY WITH ROTATOR CUFF REPAIR AND SUBACROMIAL DECOMPRESSION Right 08/16/2013   Procedure: SHOULDER ARTHROSCOPY WITH ROTATOR CUFF REPAIR AND SUBACROMIAL DECOMPRESSION;  Surgeon: Eva Elsie Herring, MD;  Location: [facility] OR;  Service: Orthopedics;  Laterality: Right;  Right shoulder arthroscopy rotator cuff repair, subacromial decompression.   TONSILLECTOMY      Allergies:  Allergies  Allergen Reactions   Amantadines Other (See Comments)    Mental changes   Clonazepam  Other (See Comments)    Mental changes   Lisinopril  Other (See Comments)    Unknown reaction   Zoloft  [Sertraline ] Other (See Comments)    Mental changes    Family History:  Family History  Problem Relation Age of Onset   Sleep apnea Sister    Colon cancer Sister      Social History:  Social History   Tobacco Use   Smoking status: Former    Current packs/day: 0.00    Average packs/day: 2.5 packs/day for 6.0 years (15.0 ttl pk-yrs)    Types: Cigarettes    Start date: 06/04/1961    Quit date: 06/05/1967    Years since quitting: 56.9   Smokeless tobacco: Never  Vaping Use   Vaping status: Never Used  Substance Use Topics   Alcohol  use: Yes    Comment: rare   Drug use: No    Review of symptoms:  Constitutional:  Negative for unexplained weight loss, night sweats, fever, chills ENT:  Negative for nose bleeds, sinus pain, painful swallowing CV:  Negative for chest pain, shortness of breath, exercise intolerance, palpitations, loss of consciousness Resp:  Negative for cough, wheezing, shortness of breath GI:  Negative for nausea, vomiting, diarrhea, bloody stools GU:  Positives noted in HPI; otherwise negative for gross hematuria, dysuria, urinary incontinence Neuro:  Negative for seizures, poor balance, limb weakness, slurred speech Psych:  Negative for lack of energy, depression, anxiety Endocrine:  Negative for polydipsia, polyuria, symptoms of hypoglycemia (dizziness, hunger, sweating) Hematologic:  Negative for anemia, purpura, petechia, prolonged or excessive bleeding, use of anticoagulants  Allergic:  Negative for difficulty breathing or choking as a result of exposure to anything; no shellfish allergy; no allergic response (rash/itch) to materials, foods  Physical exam: There were no vitals taken for this visit. GENERAL APPEARANCE: Somewhat lethargic, in a wheelchair HEENT: Atraumatic, Normocephalic. NECK: Normal appearance LUNGS: Normal inspiratory and expiratory excursion HEART: Regular Rate EXTREMITIES: Moves all extremities well.  Without clubbing, cyanosis, or edema. NEUROLOGIC:  Alert and oriented x 3, normal gait, CN II-XII grossly intact.  MENTAL STATUS:  Appropriate. SKIN:  Warm, dry and intact.    Results:   I have  reviewed prior imaging--CT images.  Skin to stone distance 11.7 cm, Hounsfield units 850.  Prostate volume measurement approximately 50 mL  I have reviewed [facility] records  Assessment: 1.  Large left renal pelvic stone.  14 mm, at last measurement in April of this year.  Asymptomatic although he does have gross hematuria.  He is on Eliquis  2.  2 left distal ureteral calculi on April CT scan.  No hydronephrosis.  He has been on tamsulosin  for 3 months.  More than likely, based on size, he has passed these  3.  Gross hematuria, most likely related to problem #1  4.  BPH based on size criteria   Plan: 1.  I would suggest we not perform any diagnostic studies at the present time.  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  He will have a CT then  2.  I will have him come back about 2 weeks after that, in 6 weeks from now, to go over films with the patient/wife  3.  Unless symptomatic issues come up with the stone other than the gross hematuria, or significant growth, with his current health concerns I think it is fine to continue surveillance -

## 2024-05-09 DIAGNOSIS — F03B2 Unspecified dementia, moderate, with psychotic disturbance: Secondary | ICD-10-CM | POA: Diagnosis not present

## 2024-05-09 DIAGNOSIS — F5102 Adjustment insomnia: Secondary | ICD-10-CM | POA: Diagnosis not present

## 2024-05-09 DIAGNOSIS — G4752 REM sleep behavior disorder: Secondary | ICD-10-CM | POA: Diagnosis not present

## 2024-05-10 ENCOUNTER — Ambulatory Visit: Payer: Medicare (Managed Care) | Admitting: Neurology

## 2024-05-10 ENCOUNTER — Encounter: Payer: Self-pay | Admitting: Neurology

## 2024-05-10 VITALS — BP 112/74 | HR 92 | Ht 68.0 in | Wt 192.0 lb

## 2024-05-10 DIAGNOSIS — G20C Parkinsonism, unspecified: Secondary | ICD-10-CM

## 2024-05-10 DIAGNOSIS — G231 Progressive supranuclear ophthalmoplegia [Steele-Richardson-Olszewski]: Secondary | ICD-10-CM | POA: Diagnosis not present

## 2024-05-10 DIAGNOSIS — F0150 Vascular dementia without behavioral disturbance: Secondary | ICD-10-CM | POA: Diagnosis not present

## 2024-05-10 NOTE — Progress Notes (Signed)
 PATIENT: Jordan [name] DOB: 1941-10-27  REASON FOR VISIT: vascular parkinsonism f/u HISTORY FROM: patient and wife PRIMARY NEUROLOGIST: Dr. Rosemarie   Chief Complaint  Patient presents with   Follow-up    RM 20 Pt w/wife. Pt here for f/u. Had hospital visit in May but was negative for stroke. Pt uses w/c for ambulation.     HISTORY OF PRESENT ILLNESS: Update 05/10/2024 : Patient is seen today after last visit with [facility] 3 months ago.  He is at significant neurological decline.  He was previously having frequent falls at home.  He was admitted twice in the month of May 2025 with altered mental status and thought to have encephalopathy likely due to UTI.  MRI scan of the brain on 5/22 showed no acute abnormalities.  Subsequently was readmitted with transient aphasia right-sided weakness felt to be a TIA.  Repeat MRI scan of the brain on 03/28/2024 also showed no acute abnormality.  EEG showed moderate diffuse encephalopathy.  UA was positive for bacteria.  He was treated with antibiotics.  The patient however is in a skilled nursing facility now.  He is not progressing well.  Physical Occupational Therapy has been discontinued due to lack of progress in not consistent participation with the patient.  He is having poor memory, his thought process is low.  He has trouble processing information.  He is not able to participate much with therapies.  He is two-person maximum assist for transfers.  He needs help with all activities of daily living.  He gets tired very easily.  Patient has been started on Exelon patch 4.6 mg per 24 hours for the last 1 month but without significant improvement.  He is tolerating it well without any GI side effects.  Patient also is on Xarelto  for stroke prevention and baby aspirin  was added following the recent TIA.  He was previously on Aricept  which has been discontinued.  He denies any delusions, hallucination, agitation or unsafe behavior. Update 02/06/2024 JM: Patient  returns for sooner scheduled visit due to recent hospitalizations.  He was seen in ED on 3/5 after a recurrent fall at home.  Wife reports that night prior, he was ambulating to the bedroom, legs started to jerk and then fell. EMS assisted patient up. He had similar episode occur the following morning therefore wife requested EMS transfer to ED for evaluation.  Workup largely unremarkable except mild dehydration.  He was transferred to inpatient rehab at [facility].  He was discharged home on 3/22.  Return to ED on 3/24 for lethargy possibly in setting of UTI and treated with antibiotics and recommended outpatient urology follow-up.  He is currently receiving home health PT/OT through the [facility].  He continues to have difficulty with standing, feels it is primarily his left leg that will jerk, sometimes able to catch himself but other times will result in a fall.  He does have chronic low back pain, can have low back pain with jerk movement, unsure if he has low back pain prior to leg jerk or after.  Usually more prevalent in the evening when he is fatigued.  Due to this, ambulation has been limited. Wife concerned that these episodes could be from side effect of Seroquel  and would be interested in tapering off and possibly switching to Nuplazid as he was initially started on Seroquel  for hallucinations.  Sinemet  dosage increased at prior visit on 2/27 from 1.5 tabs TID and 2 tabs TID, also remains on Sinemet  CR 1  tab nightly.  She is unsure if his falls have been more prevalent since that time.  Blood pressure monitored at home which has been stable but today on lower side at 100/69. Has not been able to check with standing or after falls.  No seizure like activity with falls, does not lose consciousness.  She is also concerned regarding gradual decline in cognition and question starting Aricept .     History provided for reference purposes only Update 12/29/2023 JM: Patient returns for sooner scheduled visit  for hospital follow-up accompanied by his wife.  He presented to [facility] ED on 2/20 with transient aphasia lasting 5 to 10 minutes. CTH no acute abnormalities. CTA negative LVO.  Unable to obtain MRI due to pacer.  No medication changes recommended, remained on Xarelto  and atorvastatin .  Diagnosed with TIA.  Patient was discharged after 3 hours.  Has been stable since that time without any new or reoccurring stroke/TIA symptoms. When symptoms present, he knew what he wanted to say but what came out as garbled and slurred, sounded more like sounds and not words per wife.  He has remained on Xarelto  and atorvastatin .  Wife mentions oncology lowering Xarelto  dosage from 20 mg to 15 mg daily about 1 year ago due to hematuria with anemia possibly due to kidney stones, she questions if this dose should go back to 20mg  daily, he takes Xarelto  for hx of DVT.  He does report follow-up visit with PCP on 2/10 and had lab work completed including lipid panel and A1c (unable to view via epic).    In regards to Parkinson's, believes he continues to decline.  He has had more difficulty ambulating and primarily transfers via wheelchair.  He is able to walk very short distance with rolling walker.  Wife believes he has been moving slower and has increased stiffness.  He also complains of some blurred vision as well as occasional double vision.  He has had 2 falls due to legs giving out recently but thankfully without injury or hitting head.  He remains on Sinemet  IR 1.5 tabs 3 times daily but schedule is not consistent as sometimes it can be difficult to take medications on time if he is sleeping or if it is around mealtime, she ensures he does not take sooner than 4-5 hours apart.  Typically, wife tries to give him Sinemet  around 930, between 1 to 2 PM and between 6 to 7 PM.  He will take his Sinemet  CR anywhere from 10 PM to 2 AM (just prior to going to bed).  He will have significantly increased stiffness if he misses his CR  dosage.  He is currently tolerating Sinemet  well without side effects.  He denies any wearing off effects prior to next dosage.  Update 08/17/2023 JM: Patient returns for 29-month follow-up accompanied by his wife. Overall stable since prior visit. Denies any progression of symptoms. Continues on Sinemet  1.5 tabs TID (first dose around 9:30-10am and then every 4-5 hours) and Sinemet  CR 1 tab at bedtime, denies side effects. Will have worsening symptoms if he goes past that 4-5 hour mark. Gait is shuffled, ambulates with RW short distance, w/c long distance, no recent falls. Completed therapies back in July, recommended 3 mo f/u for PD re-evaluation but unable to be scheduled for return visit until end of Jan. Does have issues with constipation, will use colax and/or miralax  as needed, does drink some fluids during the day but does not drink straight water, reports this gives him  heart burn. Has difficulty swallowing large pills, takes with applesauce, avoids chewier foods more due to dental reasons, denies any worsening of swallowing.  No further questions or concerns at this time.  Update 02/15/2023 MM: Jordan [name] is a 83 y.o. male who has been followed in this office for vascular parkinsons. Returns today for follow-up. Here today with his wife. Reports that this AM he felt weak in the lower legs. Does not drink much water. Issues with constipation- doesn't want to take a stool softner daily d/t fear of having an accident. Some trouble swallowing but not consistent. Some trouble with pills but now uses applesauce and that works better. Uses a walker at home and wheelchair when out and about.  Reports that he was having vivid dreams but Seroquel  has helped with that.  He typically goes to bed around 11 PM and wakes up around 11 AM.  His first dose of Sinemet  is around 10:30 AM, 230 and 630.  He takes Sinemet  CR around 9 PM.  Wife knows that he has some trouble with leg weakness in the late afternoons.  She  is wondering if Sinemet  IR should be increased  Update 11/23/2022 Dr. Rosemarie: He returns for follow-up after last visit 3 months ago.  He is accompanied by his wife.  They have noticed slight improvement after increasing the Sinemet  dose to 25/100 to 1 and half tablets 3 times daily.  He has noticed improvement in his mobility and does not feel as weak or fatigued.  He denies any increased side effects in the form of nausea, dizziness but does have mild sleepiness.  He denies any delusions or hallucinations.  Still needs help with many activities of daily living but he can feed himself and clean himself In the toilet.  He complains of fatigability and he does use his CPAP regularly but has a appointment coming up to see Dr. Neysa denies any recurrent symptoms of stroke TIA.  His blood pressure tends to be on the low side and he denies he is short distances.  He does agree to some slight increase in drooling particularly in the morning.  He remains on Xarelto  for his DVT and is tolerating it well without bruising or bleeding.  Sleep well most of the night.  Update 08/19/22 Dr. Rosemarie: He returns for follow-up after last visit with [facility] nurse practitioner 1 month ago.  He is accompanied by his wife.  She states that for the last week or so she has noticed worsening of his parkinsonian symptoms.  He appears extremely fatigued and weak.  He has trouble walking down the hallway from the bedroom to the living room and has to stop.  At times he has difficult time getting started with any walks.  Walks with increased shuffling and small steps.  He has mild intermittent tremor but it is not as bothersome.  Patient had been on amantadine  200 mg daily but he was having some hallucinations so this was discontinued a week or 2 ago and may have contributed to his worsening.  He is currently on Sinemet  IR 25/101 tablet 3 times daily and Sinemet  CR 25/101 tablet at bedtime.  He seems to be tolerating these medications fairly  well without significant dizziness, sleepiness or other side effects.  He has had no recent stroke or TIA symptoms.  He remains on Xarelto  for DVT treatment.  Update 07/13/2022 JM: Patient returns for follow up visit accompanied by his wife   He has been relatively  stable from neurological standpoint over the past few months.  He has remained on Sinemet  1 tab 3 times daily and Sinemet  CR 1 tab nightly. Tolerating without side effects. Does have some worsening stiffness and slowness pursing in the morning and in the evening/bedtime. Can have occasional difficulty with right leg hesitancy/coordination especially in the evening. Use of RW for short distance ambulation otherwise use of w/c.  Recently completed outpatient therapies.  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  He does try to maintain some ADLs independently but does need some assistance by his wife.  He does fatigue quickly after some ADLs such as bathing or dressing.  Previously having issues with visual hallucinations but these have greatly subsided since starting Seroquel  12.5 mg nightly, does still have some occasional visual hallucinations but nonthreatening.  Does have frequent talking and acting out (punching and kicking) while sleeping.  He is on melatonin 10 mg nightly.  Admits to limited daytime physical activity, he usually sits and watches TV during the day, can fall asleep easily in chair unless watching something that keeps his interest. 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 Neysa back in March.  He does endorse nightly use of CPAP.   He has had multiple hospitalizations since prior visit   Hospitalized 12/2021 for frequent falls and hallucinations, started on Seroquel  at that time per psychiatry recommendations and felt hallucinations in setting of vascular parkinsonism.  He was discharged to SNF for deconditioning.    He underwent kyphoplasty on 2/28 for lumbar compression fracture by Dr. Dolphus   hospitalized again 3/1 for generalized abdominal pain and frequent falls, discharged to CIR on 3/10. During CIR stay, he was started on amantadine  to help with activation and remained on low-dose Seroquel  to help with sleep and decrease nighttime hallucinations.   Suffered a closed left hip fracture post fall 01/2022 and underwent surgical repair.  He was discharged to SNF rehab and returned home on 6/8.   Diagnosed with left common femoral vein, SF junction, femoral vein, proximal profunda vein, popliteal vein and posterior tibial vein DVT on 6/28. Placed on Xarelto  with outpatient vascular surgery follow-up. Had issues with hematuria, improved after lowering dose to therapeutic dose but still having issues.  Recently transition to Lovenox  for upcoming liver biopsy, wife reports resolution of discolored urine since stopping Xarelto .  Plans on pursuing cystoscopy in the near future.  Update 12/08/2021 JM: Returns for follow-up visit after prior visit 3 months ago for concern of worsening gait and functioning. Hx of prior stroke and parkinsonism. At prior visit, adjusted Sinemet  dosage - currently on 25-100 1 tab 3x daily and ER 25-100 nightly. Per patient and wife, gait has remained the same - denies worsening but also denies any improvement. Tolerating Sinemet  without side effects but no noticeable difference since dose change 3 months ago. Recently seen by Dr. Bonner for lower back issues and plans to start PT for primarily lumbar spine on 3/2. Continues to use RW but limited ambulate and only short distance. Does c/o neck and shoulder pain which is chronic but gradually worsening. Limited neck ROM. Denies radiculopathy. No prior cervical imaging. At times, will have difficulty holding head up and sits in a hunched over position. He will have difficulty holding on to small, especially lighter objects, right > left since  stroke. C/o shortness of breath after talking for prolonged periods of time which  has been present over the past 4-5 months. Denies SOB on exertion but wife believes that he does. Wife also notes voice being softer. Denies any swallowing difficulties. Does have chronic diabetic neuropathy with some worsening over the past 6 months. Recent A1c satisfactory. BUE tremors stable. He also c/o continued fatigue.    Stable from stroke standpoint without new stroke/TIA symptoms. Compliant on Plavix  and atorvastatin  without side effects.  Blood pressure today 112/75. Recent lipid panel satisfactory.    No further concerns at this time     Update 09/14/2021 JM: Returns for sooner scheduled visit accompanied by his wife.  Concern regarding worsening gait and functioning since prior visit. He did have a fall on 8/23 with subsequent left acetabular fracture - ortho eval recommended nonoperative management.  Admitted to SNF rehab and discharged on 9/23. Reports since he returned home from rehab, gait has gradually worsened (increased shuffling and decreased step height) and has been requiring more assistance.  Working with [facility] PT initially 2x weekly now only 1x per week. Suffered a fall 10/29. Eval at [facility] walk-in clinic with xray showing possible compression fracture per wife report (unable to personally view via epic).  He is currently awaiting eval by orthopedics. He has had f/u with ortho for hip fx since returning home and f/u visit scheduled end of this month. Per wife, currently limited ambulation and exercise - will only walk to go to the bathroom. PT currently on hold until further eval with our office and ortho.    Wife apparently increased Sinemet  to 3 times daily (currently taking at 10am, 4pm and 10pm) approx 1 month ago. Per wife, slight improvement of gait and functioning since increase during the day but does have worsening gait and stiffness upon awakening. Follows with Dr. 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 Also reports hallucinations during SNF rehab stay - this has since improved.  Wife questions if Sinemet  dosage needs to be adjusted   Eval by Dr. Buck for second opinion of gait disorder and wife concern of Parkinson's disease. 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      Update 05/12/2021 Dr. Rosemarie; He returns for follow-up after last visit with [facility] nurse practitioner in April 2022.  Is accompanied by his wife.  Patient has had no recurrent stroke or TIA symptoms.  He continues to have mild dizziness and imbalance in the right side paresthesias and diminished fine motor skills.  He occasionally has tingling of his fingers or his feet but these are not bothersome.  He is quite active and works out 3 days a week at [employer].  He remains on Plavix  which is tolerating well without bruising or bleeding.  He takes Lipitor 20 mg daily and is tolerating it well.  He states his primary care physician did check his lipid profile and A1c about 5 weeks ago but I do not have those results and he was told they were satisfactory.  He does have chronic low back pain and also complains of fatigue and tiredness.  He has not discussed this with primary physician had any work-up done for this.  Patient has history of parkinsonian tremor and has a prescription for Sinemet  25/103 times daily but states that he gets it only once a day or so as he forgets to take it and other  times.  Tremor seems intermittent and not constant and is not functionally disabling.  Wife feels that he does have a shuffling gait particularly when he is tired.  He however denies significant bradykinesia and is able to get up and walk by himself easily without assistance.  He denies drooling of saliva or micrographia.  He has not had any  particular worsening of his tremors or other parkinsonian symptoms in recent months.     Update 02/19/2021 JM: Jordan [name] returns for acute visit per wife request due to recent onset of increased fatigue   Known hx of daytime fatigue with known OSA on CPAP and narcolepsy. Per wife, over the past 3 to 4 days, he has been experiencing increased daytime sleepiness where he is sleeping majority of the day as well as sleeping well at night.  Reports PCP recently changed Adderall IR to XR for narcolepsy but will occasionally miss a dose.  Also reports recently being started on sertraline  50 mg daily on 4/14 which he will take in the morning.  He also recently started PT on Monday for gait impairment and BPPV.  No other associated symptoms such as confusion, worsening cognition, weakness, numbness/tingling, speech deficit, visual changes or gait changes.  No further concerns at this time     Update 11/27/2020 JM: Jordan [name] returns per patient request for sooner scheduled visit due to continued gait impairment and lower back pain.  He is currently unaccompanied.  Starting low dose Sinemet  at prior visit for parkinsonism with some improvement of his ambulation.  Tremors have been stable without worsening.  He does feel as though his ambulation is worse with increased back pain. He did have a mechanical fall seen in ED on 11/05/2020 thankfully without injury.   He does have chronic back pain previously doing routine exercise prior to his stroke but has not returned back to routine exercise since. He admits to sedentary life style with limited physical activity. He will fall asleep easily during the day watching TV and occasional issues with insomnia. 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  No further concerns at this time.   Update 09/15/2020 JM: Jordan [name] returns for stroke follow-up accompanied by his wife.  He has been doing well since prior  visit with residual decreased right hand dexterity and decreased sensory right fingertips.  Denies new or worsening stroke/TIA symptoms. He does complain of short-term memory loss such as forgetting an actors name or random fact.  He also continues to experience R>L upper extremity tremors and gait impairment with imbalance with fall in 06/2020 resulting in nasal bone fracture.  Wife reports shuffling type gait when fatigued.  Remains on Plavix  and atorvastatin  for secondary stroke prevention without side effects.  Blood pressure today 126/73.  Glucose level stable per patient.  Reports ongoing compliance with CPAP for OSA management managed by pulmonology Dr. Neysa.  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  He complains of excessive daytime fatigue, vivid dreams and occasional insomnia. Per patient, he has not trialed any other medication for narcolepsy.  No further concerns at this time.   Update 05/15/2020 JM: Jordan [name] is being seen for hospital follow-up accompanied by his wife.  He was discharged home from CIR on 04/29/2020 after 11 day stay.  Residual deficits of mild imbalance and mildly decreased sensation R finger tips and decreased right hand fine motor skills. Does  report ongoing improvement currently working with home health PT. he has had gait impairment ongoing and right hand decreased fine motor control with tremors and was evaluated by Dr. Buck on 12/11/2019 for possible Parkinson's but was not felt this was Parkinson's related.  He does continue to use cane for ambulation and denies any recent falls.  He is questioning return to driving as he was advised no driving at discharge until further cleared.  Completed 3 weeks DAPT and continues on Plavix  alone without bleeding or bruising.  Continues on atorvastatin  40 mg daily without myalgias.  Blood pressure today 118/84.  Glucose levels stable.  Endorses ongoing use of CPAP for OSA management.  No further  concerns at this time.    Stroke admission 04/14/2020 Jordan [name] is a 83 y.o. male with history of  diabetes, stroke, hypertension, hyperlipidemia, permanent cardiac pacemaker  who presented on 04/14/2020 with R sided decreased sensation.  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  Previously on clopidogrel  and recommended DAPT for 3 weeks then clopidogrel  alone.  Presented in hypertensive urgency with BP 204/116 and recommended long-term BP goal normotensive range.  LDL 57 and recommended continuation of atorvastatin  40 mg daily.  Uncontrolled DM with A1c 7.9.  Other stroke risk factors include prior stroke 01/2018 with multifocal punctate infarcts concerning for embolic pattern, former tobacco use, EtOH use, obesity, CAD, OSA on CPAP and permanent cardiac pacemaker.  Evaluated by therapies who recommended CIR for residual deficits of imbalance and safety affecting ADLs and mobility.  Discharged to CIR on 04/18/2020.   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 Small vessel disease. Sinus dz. ASPECTS 10.    CTA head & neck no LVO. Aortic atherosclerosis.  MRI  L thalamic infarct. Small vessel disease. Atrophy.  2D Echo EF 65-70%. No source of embolus. LA mildly dilated.  Pacemaker interrogation no afib LDL 57 -continued atorvastatin  40 mg daily HgbA1c 7.9 Lovenox  40 mg sq daily for VTE prophylaxis clopidogrel  75 mg daily prior to admission, now on aspirin  81 mg daily and clopidogrel  75 mg daily. Continue DAPT x 3 weeks then plavix  alone   Therapy recommendations:  CIR Disposition:  CIR  REVIEW OF SYSTEMS: Out of a complete 14 system review of symptoms, the patient complains only of the following symptoms, and all other reviewed systems are negative.  ALLERGIES: Allergies  Allergen Reactions   Amantadines Other (See Comments)    Mental changes   Clonazepam  Other (See Comments)     Mental changes   Lisinopril  Other (See Comments)    Unknown reaction   Zoloft  [Sertraline ] Other (See Comments)    Mental changes    HOME MEDICATIONS: Outpatient Medications Prior to Visit  Medication Sig Dispense Refill   acetaminophen  (TYLENOL ) 500 MG tablet Take 500-1,000 mg by mouth every 6 (six) hours as needed for moderate pain or mild pain.     atorvastatin  (LIPITOR) 20 MG tablet Take 20 mg by mouth daily.     b complex vitamins capsule Take 1 capsule by mouth at bedtime.     bifidobacterium infantis (ALIGN) capsule Take 1 capsule by mouth daily.     Camphor-Menthol-Methyl Sal (HM SALONPAS PAIN RELIEF EX) Apply 1 patch topically daily as needed (Back pain).     carbidopa -levodopa  (SINEMET  CR) 50-200 MG tablet Take 1 tablet by mouth at bedtime.     Carbidopa -Levodopa  ER (SINEMET  CR) 25-100  MG tablet controlled release Take 1 tablet by mouth 3 (three) times daily with meals.     Cholecalciferol  (VITAMIN D ) 50 MCG (2000 UT) CAPS Take 2,000 Units by mouth at bedtime.     clotrimazole-betamethasone (LOTRISONE) cream Apply 1 Application topically daily as needed (Rash).     feeding supplement, GLUCERNA SHAKE, (GLUCERNA SHAKE) LIQD Take 237 mLs by mouth 2 (two) times daily between meals. Alternate with ensure Diabetic     JANUVIA 100 MG tablet Take 100 mg by mouth daily.     lidocaine  (LIDODERM ) 5 % Place 1 patch onto the skin daily as needed. Purchase over the counter. On for 12 hours and off for 12 hours 30 patch 0   Melatonin 10 MG TABS Take 5 mg by mouth at bedtime.     metFORMIN  (GLUCOPHAGE ) 500 MG tablet Take 1,000 mg by mouth 2 (two) times daily.     midodrine  (PROAMATINE ) 5 MG tablet Take 1 tablet (5 mg total) by mouth 2 (two) times daily with a meal. HOLD IF SBP >130     modafinil  (PROVIGIL ) 200 MG tablet TAKE 1 TABLET BY MOUTH EVERY DAY (Patient taking differently: Take 100 mg by mouth daily.) 30 tablet 5   NITROSTAT  0.4 MG SL tablet PLACE 1 TABLET (0.4 MG TOTAL) UNDER THE  TONGUE EVERY 5 (FIVE) MINUTES AS NEEDED FOR CHEST PAIN. 25 tablet 5   Omega-3 Fatty Acids (OMEGA 3 PO) Take 1,280 mg by mouth at bedtime.     Pimavanserin Tartrate (NUPLAZID) 10 MG TABS Take 10 mg by mouth daily.     polyethylene glycol (MIRALAX  / GLYCOLAX ) 17 g packet Take 17 g by mouth 2 (two) times daily. (Patient taking differently: Take 17 g by mouth daily as needed.)     polyvinyl alcohol  (LIQUIFILM TEARS) 1.4 % ophthalmic solution Place 1 drop into both eyes as needed for dry eyes. 15 mL 0   rivaroxaban  (XARELTO ) 20 MG TABS tablet Take 1 tablet (20 mg total) by mouth daily. 30 tablet 1   rivastigmine (EXELON) 4.6 mg/24hr Place 4.6 mg onto the skin daily.     senna (SENOKOT) 8.6 MG TABS tablet Take 1 tablet (8.6 mg total) by mouth daily.     tamsulosin  (FLOMAX ) 0.4 MG CAPS capsule Take 0.4 mg by mouth at bedtime.     Vitamin D , Ergocalciferol , (DRISDOL ) 1.25 MG (50000 UNIT) CAPS capsule TAKE 1 CAPSULE (50,000 UNITS TOTAL) BY MOUTH EVERY 7 (SEVEN) DAYS (Patient taking differently: Take 1,250 Units by mouth daily.) 5 capsule 0   OVER THE COUNTER MEDICATION Take 72 mg by mouth in the morning and at bedtime. Ferrasorb     donepezil  (ARICEPT ) 5 MG tablet Take 1 tablet (5 mg total) by mouth daily.     Methylcobalamin 5000 MCG CHEW Chew 500 mcg by mouth at bedtime.     QUEtiapine  (SEROQUEL ) 25 MG tablet Take 1 tablet (25 mg total) by mouth every evening. TAKE AT 5 PM     No facility-administered medications prior to visit.    PAST MEDICAL HISTORY: Past Medical History:  Diagnosis Date   Arthritis    R shoulder, bone spur   Arthritis    hands (03/25/2016)   Chronic diastolic heart failure (HCC)    Chronic lower back pain    Coronary heart disease    Dr Esmeralda Sharps   Diverticulitis 12/2021   DVT (deep venous thrombosis) (HCC) 05/2022   left leg   H/O cardiovascular stress test    perhaps  last one was 2009   Heart murmur    12/29/11 echo: mild MR, no AS, trivial TR   History of gout     Hyperlipidemia    Hypertension    saw Dr. HILARIO Sharps last earl;y- 2014, cardiac cath. last done ?2009, blocks seen didn't require any intervention at that point.   Narcolepsy    [name] 04-11-97; Mean Latency 1.24min, SOREM 2   OSA on CPAP    NPSG 12-13-98 AHI 22.7   PONV (postoperative nausea and vomiting)    Presence of permanent cardiac pacemaker    Shortness of breath    with exertion    Stroke (HCC)    Type II diabetes mellitus (HCC)     PAST SURGICAL HISTORY: Past Surgical History:  Procedure Laterality Date   CARDIAC CATHETERIZATION  2009?   EP IMPLANTABLE DEVICE N/A 03/25/2016   Procedure: Pacemaker Implant - Dual Chamber;  Surgeon: Danelle LELON Birmingham, MD;  Location: [facility] INVASIVE CV LAB;  Service: Cardiovascular;  Laterality: N/A;   FRACTURE SURGERY     HIP FRACTURE SURGERY     INSERT / REPLACE / REMOVE PACEMAKER  03/24/2016   INTRAMEDULLARY (IM) NAIL INTERTROCHANTERIC Left 02/08/2022   Procedure: INTRAMEDULLARY (IM) NAIL INTERTROCHANTRIC;  Surgeon: Addie Cordella Hamilton, MD;  Location: [facility] OR;  Service: Orthopedics;  Laterality: Left;   IR KYPHO LUMBAR INC FX REDUCE BONE BX UNI/BIL CANNULATION INC/IMAGING  12/29/2021   IR RADIOLOGIST EVAL & MGMT  12/15/2021   IR RADIOLOGY PERIPHERAL GUIDED IV START  07/14/2022   IR US  GUIDE VASC ACCESS RIGHT  07/15/2022   LEFT AND RIGHT HEART CATHETERIZATION WITH CORONARY ANGIOGRAM N/A 06/05/2014   Procedure: LEFT AND RIGHT HEART CATHETERIZATION WITH CORONARY ANGIOGRAM;  Surgeon: Victory LELON Sharps DOUGLAS, MD;  Location: [facility] CATH LAB;  Service: Cardiovascular;  Laterality: N/A;   NASAL FRACTURE SURGERY     SHOULDER ARTHROSCOPY WITH ROTATOR CUFF REPAIR AND SUBACROMIAL DECOMPRESSION Right 08/16/2013   Procedure: SHOULDER ARTHROSCOPY WITH ROTATOR CUFF REPAIR AND SUBACROMIAL DECOMPRESSION;  Surgeon: Eva Elsie Herring, MD;  Location: [facility] OR;  Service: Orthopedics;  Laterality: Right;  Right shoulder arthroscopy rotator cuff repair, subacromial decompression.    TONSILLECTOMY      FAMILY HISTORY: Family History  Problem Relation Age of Onset   Sleep apnea Sister    Colon cancer Sister     SOCIAL HISTORY: Social History   Socioeconomic History   Marital status: Married    Spouse name: Burnard   Number of children: Not on file   Years of education: Not on file   Highest education level: Not on file  Occupational History   Occupation: [employer]   Occupation: [place] [employer]  Tobacco Use   Smoking status: Former    Current packs/day: 0.00    Average packs/day: 2.5 packs/day for 6.0 years (15.0 ttl pk-yrs)    Types: Cigarettes    Start date: 06/04/1961    Quit date: 06/05/1967    Years since quitting: 56.9   Smokeless tobacco: Never  Vaping Use   Vaping status: Never Used  Substance and Sexual Activity   Alcohol  use: Yes    Comment: rare   Drug use: No   Sexual activity: Not Currently  Other Topics Concern   Not on file  Social History Narrative   Lives with wife and son   Right handed   Drinks 1 cups caffeine daily   Social Drivers of Corporate investment banker Strain: Not on file  Food Insecurity: No  Food Insecurity (03/22/2024)   Hunger Vital Sign    Worried About Running Out of Food in the Last Year: Never true    Ran Out of Food in the Last Year: Never true  Transportation Needs: No Transportation Needs (03/22/2024)   PRAPARE - Administrator, Civil Service (Medical): No    Lack of Transportation (Non-Medical): No  Physical Activity: Not on file  Stress: Not on file  Social Connections: Socially Integrated (03/29/2024)   Social Connection and Isolation Panel    Frequency of Communication with Friends and Family: More than three times a week    Frequency of Social Gatherings with Friends and Family: More than three times a week    Attends Religious Services: More than 4 times per year    Active Member of [employer] or Organizations: Yes    Attends Banker Meetings: 1 to 4 times per year     Marital Status: Married  Catering manager Violence: Not At Risk (03/22/2024)   Humiliation, Afraid, Rape, and Kick questionnaire    Fear of Current or Ex-Partner: No    Emotionally Abused: No    Physically Abused: No    Sexually Abused: No      PHYSICAL EXAM  Vitals:   05/10/24 1532  BP: 112/74  Pulse: 92  Weight: 192 lb (87.1 kg)  Height: 5' 8 (1.727 m)   Body mass index is 29.19 kg/m.  Generalized: Well developed, very pleasant elderly Caucasian male, in no acute distress   Neurological examination  Mentation: Alert disoriented to time, place, history taking.  Only simple midline and one-step commands.  Diminished attention, registration and recall.  Fluent. Mild to mod hypophonia.  Moderately masked facial expressions. Cranial nerve II-XII: Pupils were equal round reactive to light.  Impaired OD extraocular movement, convergence insufficiency.  Absent vertical gaze.  Is sitting with his head tilted down.  Complains of pain to passive extension of his neck.. Facial sensation and strength were normal. Uvula tongue midline. Head turning and shoulder shrug  were normal and symmetric. Motor: The motor testing reveals 5 over 5 strength of all 4 extremities.  Unable to appreciate resting tremor.  Mild asterixis of outstretched upper extremities right greater than left.  Moderate cogwheel rigidity upon activation R>L.  Finger taps and toe taps mildly impaired Sensory: Sensory testing is intact to soft touch on all 4 extremities. No evidence of extinction is noted.  Gait and station: Deferred      No data to display            DIAGNOSTIC DATA (LABS, IMAGING, TESTING) - I reviewed patient records, labs, notes, testing and imaging myself where available.  Lab Results  Component Value Date   WBC 9.5 03/28/2024   HGB 12.5 (L) 03/28/2024   HCT 39.5 03/28/2024   MCV 86.6 03/28/2024   PLT 254 03/28/2024      Component Value Date/Time   NA 141 03/28/2024 1046   NA 145  07/23/2021 0000   K 4.3 03/28/2024 1046   CL 103 03/28/2024 1046   CO2 26 03/28/2024 1046   GLUCOSE 165 (H) 03/28/2024 1046   BUN 13 03/28/2024 1046   BUN 14 07/23/2021 0000   CREATININE 1.01 03/28/2024 1046   CREATININE 1.02 12/13/2023 1221   CREATININE 1.16 03/18/2016 1011   CALCIUM  10.1 03/28/2024 1046   PROT 6.7 03/21/2024 1731   ALBUMIN 3.2 (L) 03/21/2024 1731   AST 26 03/21/2024 1731   AST 20 12/13/2023 1221  ALT 6 03/21/2024 1731   ALT 5 12/13/2023 1221   ALKPHOS 62 03/21/2024 1731   BILITOT 0.5 03/21/2024 1731   BILITOT 0.4 12/13/2023 1221   GFRNONAA >60 03/28/2024 1046   GFRNONAA >60 12/13/2023 1221   GFRAA >90 07/23/2021 0000   Lab Results  Component Value Date   CHOL 114 03/22/2024   HDL 39 (L) 03/22/2024   LDLCALC 55 03/22/2024   TRIG 99 03/22/2024   CHOLHDL 2.9 03/22/2024   Lab Results  Component Value Date   HGBA1C 6.4 (H) 03/22/2024   Lab Results  Component Value Date   VITAMINB12 592 02/27/2024   Lab Results  Component Value Date   TSH 1.177 03/22/2024      ASSESSMENT AND PLAN Jordan [name] is a 83 y.o. 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  .  Remote history of lacunar stroke and now left hemispheric TIA in May 2025.  PLAN : I had a long discussion with the patient and his wife regarding his recent significant cognitive decline following hospitalization in May 2025 for encephalopathy and TIA.  I recommend reducing dose of Sinemet  CR   1 tablet 3 times daily.  Increase Exelon patch to 9.5 mg per 24 hours.  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  Continue Eliquis for stroke prevention maintain aggressive risk factor modification strict control of hypertension with blood pressure goal below 130/90, diabetes with hemoglobin A1c goal below 6.5% and lipids with LDL cholesterol goal below 70 mg percent.  Patient with unfortunately wheelchair-bound and nursing home requiring 24-hour care not progressing well. Greater than 50% time during this 40-minute visit was spent in counseling and coordination of care about his remote stroke, TIA, atypical parkinsonism and now dementia and cognitive impairment developing plan of treatment and evaluation and answering questions. [name] Popp, MD [facility] [address] [place], [place] [address]  Phone [phone] Fax [phone] Note: This document was prepared with digital dictation and possible smart phrase technology. Any transcriptional errors that result from this process are unintentional.

## 2024-05-10 NOTE — Patient Instructions (Addendum)
 I had a long discussion with the patient and his wife regarding his recent significant cognitive decline following hospitalization in May 2025 for encephalopathy and TIA.  I recommend reducing dose of Sinemet  CR   1 tablet 3 times daily.  Increase Exelon patch to 9.5 mg per 24 hours.  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  Continue Eliquis for stroke prevention maintain aggressive risk factor modification strict control of hypertension with blood pressure goal below 130/90, diabetes with hemoglobin A1c goal below 6.5% and lipids with LDL cholesterol goal below 70 mg percent.  Patient with unfortunately wheelchair-bound and nursing home requiring 24-hour care not progressing well.

## 2024-05-11 ENCOUNTER — Other Ambulatory Visit: Payer: Self-pay

## 2024-05-11 DIAGNOSIS — C7B8 Other secondary neuroendocrine tumors: Secondary | ICD-10-CM

## 2024-05-11 DIAGNOSIS — D3A8 Other benign neuroendocrine tumors: Secondary | ICD-10-CM

## 2024-05-11 DIAGNOSIS — D649 Anemia, unspecified: Secondary | ICD-10-CM

## 2024-05-12 ENCOUNTER — Encounter (HOSPITAL_COMMUNITY): Payer: Self-pay | Admitting: Emergency Medicine

## 2024-05-12 ENCOUNTER — Emergency Department (HOSPITAL_COMMUNITY)

## 2024-05-12 ENCOUNTER — Inpatient Hospital Stay (HOSPITAL_COMMUNITY)
Admission: EM | Admit: 2024-05-12 | Discharge: 2024-05-17 | DRG: 853 | Disposition: A | Discharge status: Skilled Nursing Facility | Source: Skilled Nursing Facility | Attending: Internal Medicine | Admitting: Internal Medicine

## 2024-05-12 DIAGNOSIS — Z794 Long term (current) use of insulin: Secondary | ICD-10-CM

## 2024-05-12 DIAGNOSIS — N3000 Acute cystitis without hematuria: Secondary | ICD-10-CM | POA: Diagnosis present

## 2024-05-12 DIAGNOSIS — E1169 Type 2 diabetes mellitus with other specified complication: Secondary | ICD-10-CM | POA: Diagnosis present

## 2024-05-12 DIAGNOSIS — Z87891 Personal history of nicotine dependence: Secondary | ICD-10-CM | POA: Diagnosis not present

## 2024-05-12 DIAGNOSIS — R5381 Other malaise: Secondary | ICD-10-CM | POA: Diagnosis present

## 2024-05-12 DIAGNOSIS — F02818 Dementia in other diseases classified elsewhere, unspecified severity, with other behavioral disturbance: Secondary | ICD-10-CM | POA: Diagnosis present

## 2024-05-12 DIAGNOSIS — I2510 Atherosclerotic heart disease of native coronary artery without angina pectoris: Secondary | ICD-10-CM | POA: Diagnosis present

## 2024-05-12 DIAGNOSIS — Z888 Allergy status to other drugs, medicaments and biological substances status: Secondary | ICD-10-CM

## 2024-05-12 DIAGNOSIS — G20A1 Parkinson's disease without dyskinesia, without mention of fluctuations: Secondary | ICD-10-CM | POA: Diagnosis present

## 2024-05-12 DIAGNOSIS — N201 Calculus of ureter: Secondary | ICD-10-CM | POA: Diagnosis present

## 2024-05-12 DIAGNOSIS — F0282 Dementia in other diseases classified elsewhere, unspecified severity, with psychotic disturbance: Secondary | ICD-10-CM | POA: Diagnosis present

## 2024-05-12 DIAGNOSIS — E785 Hyperlipidemia, unspecified: Secondary | ICD-10-CM | POA: Diagnosis present

## 2024-05-12 DIAGNOSIS — Z1152 Encounter for screening for COVID-19: Secondary | ICD-10-CM

## 2024-05-12 DIAGNOSIS — B961 Klebsiella pneumoniae [K. pneumoniae] as the cause of diseases classified elsewhere: Secondary | ICD-10-CM | POA: Diagnosis present

## 2024-05-12 DIAGNOSIS — A4159 Other Gram-negative sepsis: Principal | ICD-10-CM | POA: Diagnosis present

## 2024-05-12 DIAGNOSIS — E876 Hypokalemia: Secondary | ICD-10-CM | POA: Diagnosis present

## 2024-05-12 DIAGNOSIS — G4733 Obstructive sleep apnea (adult) (pediatric): Secondary | ICD-10-CM | POA: Diagnosis present

## 2024-05-12 DIAGNOSIS — I10 Essential (primary) hypertension: Secondary | ICD-10-CM | POA: Diagnosis not present

## 2024-05-12 DIAGNOSIS — G9341 Metabolic encephalopathy: Secondary | ICD-10-CM | POA: Diagnosis present

## 2024-05-12 DIAGNOSIS — R6520 Severe sepsis without septic shock: Secondary | ICD-10-CM | POA: Diagnosis present

## 2024-05-12 DIAGNOSIS — Z79899 Other long term (current) drug therapy: Secondary | ICD-10-CM

## 2024-05-12 DIAGNOSIS — G47419 Narcolepsy without cataplexy: Secondary | ICD-10-CM | POA: Diagnosis present

## 2024-05-12 DIAGNOSIS — Z87440 Personal history of urinary (tract) infections: Secondary | ICD-10-CM

## 2024-05-12 DIAGNOSIS — I480 Paroxysmal atrial fibrillation: Secondary | ICD-10-CM | POA: Diagnosis present

## 2024-05-12 DIAGNOSIS — A419 Sepsis, unspecified organism: Secondary | ICD-10-CM | POA: Diagnosis not present

## 2024-05-12 DIAGNOSIS — N139 Obstructive and reflux uropathy, unspecified: Secondary | ICD-10-CM | POA: Diagnosis present

## 2024-05-12 DIAGNOSIS — N390 Urinary tract infection, site not specified: Secondary | ICD-10-CM | POA: Diagnosis present

## 2024-05-12 DIAGNOSIS — R441 Visual hallucinations: Secondary | ICD-10-CM | POA: Diagnosis not present

## 2024-05-12 DIAGNOSIS — Z86718 Personal history of other venous thrombosis and embolism: Secondary | ICD-10-CM

## 2024-05-12 DIAGNOSIS — I110 Hypertensive heart disease with heart failure: Secondary | ICD-10-CM | POA: Diagnosis present

## 2024-05-12 DIAGNOSIS — Z800 Family history of malignant neoplasm of digestive organs: Secondary | ICD-10-CM

## 2024-05-12 DIAGNOSIS — Z8673 Personal history of transient ischemic attack (TIA), and cerebral infarction without residual deficits: Secondary | ICD-10-CM

## 2024-05-12 DIAGNOSIS — Z7984 Long term (current) use of oral hypoglycemic drugs: Secondary | ICD-10-CM

## 2024-05-12 DIAGNOSIS — R54 Age-related physical debility: Secondary | ICD-10-CM | POA: Diagnosis present

## 2024-05-12 DIAGNOSIS — C7A8 Other malignant neuroendocrine tumors: Secondary | ICD-10-CM | POA: Diagnosis present

## 2024-05-12 DIAGNOSIS — Z950 Presence of cardiac pacemaker: Secondary | ICD-10-CM

## 2024-05-12 DIAGNOSIS — Z7901 Long term (current) use of anticoagulants: Secondary | ICD-10-CM | POA: Diagnosis not present

## 2024-05-12 DIAGNOSIS — I5032 Chronic diastolic (congestive) heart failure: Secondary | ICD-10-CM | POA: Diagnosis not present

## 2024-05-12 DIAGNOSIS — R531 Weakness: Secondary | ICD-10-CM | POA: Diagnosis not present

## 2024-05-12 DIAGNOSIS — Z7401 Bed confinement status: Secondary | ICD-10-CM | POA: Diagnosis not present

## 2024-05-12 DIAGNOSIS — G4752 REM sleep behavior disorder: Secondary | ICD-10-CM | POA: Diagnosis present

## 2024-05-12 DIAGNOSIS — J9601 Acute respiratory failure with hypoxia: Secondary | ICD-10-CM | POA: Diagnosis present

## 2024-05-12 HISTORY — DX: REM sleep behavior disorder: G47.52

## 2024-05-12 HISTORY — DX: Parkinson's disease without dyskinesia, without mention of fluctuations: G20.A1

## 2024-05-12 HISTORY — DX: Unspecified dementia, unspecified severity, without behavioral disturbance, psychotic disturbance, mood disturbance, and anxiety: F03.90

## 2024-05-12 LAB — I-STAT CG4 LACTIC ACID, ED: Lactic Acid, Venous: 1.9 mmol/L (ref 0.5–1.9)

## 2024-05-12 LAB — COMPREHENSIVE METABOLIC PANEL WITH GFR
ALT: 6 U/L (ref 0–44)
AST: 20 U/L (ref 15–41)
Albumin: 3.6 g/dL (ref 3.5–5.0)
Alkaline Phosphatase: 94 U/L (ref 38–126)
Anion gap: 13 (ref 5–15)
BUN: 22 mg/dL (ref 8–23)
CO2: 22 mmol/L (ref 22–32)
Calcium: 9.9 mg/dL (ref 8.9–10.3)
Chloride: 102 mmol/L (ref 98–111)
Creatinine, Ser: 1.24 mg/dL (ref 0.61–1.24)
GFR, Estimated: 57 mL/min — ABNORMAL LOW (ref >60)
Glucose, Bld: 150 mg/dL — ABNORMAL HIGH (ref 70–99)
Potassium: 4.8 mmol/L (ref 3.5–5.1)
Sodium: 137 mmol/L (ref 135–145)
Total Bilirubin: 1.3 mg/dL — ABNORMAL HIGH (ref 0.0–1.2)
Total Protein: 7.5 g/dL (ref 6.5–8.1)

## 2024-05-12 LAB — CBC WITH DIFFERENTIAL/PLATELET
Abs Immature Granulocytes: 0.04 K/uL (ref 0.00–0.07)
Basophils Absolute: 0 K/uL (ref 0.0–0.1)
Basophils Relative: 0 %
Eosinophils Absolute: 0.1 K/uL (ref 0.0–0.5)
Eosinophils Relative: 0 %
HCT: 44.8 % (ref 39.0–52.0)
Hemoglobin: 14.5 g/dL (ref 13.0–17.0)
Immature Granulocytes: 0 %
Lymphocytes Relative: 5 %
Lymphs Abs: 0.7 K/uL (ref 0.7–4.0)
MCH: 28.1 pg (ref 26.0–34.0)
MCHC: 32.4 g/dL (ref 30.0–36.0)
MCV: 86.8 fL (ref 80.0–100.0)
Monocytes Absolute: 0.9 K/uL (ref 0.1–1.0)
Monocytes Relative: 7 %
Neutro Abs: 11.9 K/uL — ABNORMAL HIGH (ref 1.7–7.7)
Neutrophils Relative %: 88 %
Platelets: 215 K/uL (ref 150–400)
RBC: 5.16 MIL/uL (ref 4.22–5.81)
RDW: 15.5 % (ref 11.5–15.5)
WBC: 13.6 K/uL — ABNORMAL HIGH (ref 4.0–10.5)
nRBC: 0 % (ref 0.0–0.2)

## 2024-05-12 LAB — URINALYSIS, W/ REFLEX TO CULTURE (INFECTION SUSPECTED)
Bilirubin Urine: NEGATIVE
Glucose, UA: NEGATIVE mg/dL
Ketones, ur: 20 mg/dL — AB
Nitrite: POSITIVE — AB
Protein, ur: 100 mg/dL — AB
RBC / HPF: >50 RBC/hpf (ref 0–5)
Specific Gravity, Urine: 1.019 (ref 1.005–1.030)
WBC, UA: >50 WBC/hpf (ref 0–5)
pH: 5 (ref 5.0–8.0)

## 2024-05-12 LAB — RESP PANEL BY RT-PCR (RSV, FLU A&B, COVID)  RVPGX2
Influenza A by PCR: NEGATIVE
Influenza B by PCR: NEGATIVE
Resp Syncytial Virus by PCR: NEGATIVE
SARS Coronavirus 2 by RT PCR: NEGATIVE

## 2024-05-12 LAB — I-STAT VENOUS BLOOD GAS, ED
Acid-Base Excess: 0 mmol/L (ref 0.0–2.0)
Bicarbonate: 24 mmol/L (ref 20.0–28.0)
Calcium, Ion: 1.11 mmol/L — ABNORMAL LOW (ref 1.15–1.40)
HCT: 42 % (ref 39.0–52.0)
Hemoglobin: 14.3 g/dL (ref 13.0–17.0)
O2 Saturation: 95 %
Potassium: 4.6 mmol/L (ref 3.5–5.1)
Sodium: 136 mmol/L (ref 135–145)
TCO2: 25 mmol/L (ref 22–32)
pCO2, Ven: 36.2 mmHg — ABNORMAL LOW (ref 44–60)
pH, Ven: 7.43 (ref 7.25–7.43)
pO2, Ven: 71 mmHg — ABNORMAL HIGH (ref 32–45)

## 2024-05-12 MED ORDER — ACETAMINOPHEN 650 MG RE SUPP
650.0000 mg | Freq: Four times a day (QID) | RECTAL | Status: DC | PRN
  Administered 2024-05-13: 650 mg via RECTAL
  Filled 2024-05-12: qty 1

## 2024-05-12 MED ORDER — MODAFINIL 100 MG PO TABS
100.0000 mg | ORAL_TABLET | Freq: Every day | ORAL | Status: DC
  Administered 2024-05-13 – 2024-05-16 (×4): 100 mg via ORAL
  Filled 2024-05-12 (×4): qty 1

## 2024-05-12 MED ORDER — LACTATED RINGERS IV BOLUS (SEPSIS)
1000.0000 mL | Freq: Once | INTRAVENOUS | Status: AC
  Administered 2024-05-12: 1000 mL via INTRAVENOUS

## 2024-05-12 MED ORDER — SODIUM CHLORIDE 0.9 % IV SOLN
2.0000 g | INTRAVENOUS | Status: DC
  Administered 2024-05-13 – 2024-05-15 (×3): 2 g via INTRAVENOUS
  Filled 2024-05-12 (×3): qty 20

## 2024-05-12 MED ORDER — ONDANSETRON HCL 4 MG PO TABS: 4.0000 mg | ORAL_TABLET | Freq: Four times a day (QID) | ORAL | Status: DC | PRN

## 2024-05-12 MED ORDER — LACTATED RINGERS IV SOLN: INTRAVENOUS | Status: DC

## 2024-05-12 MED ORDER — SODIUM CHLORIDE 0.9 % IV SOLN
2.0000 g | Freq: Once | INTRAVENOUS | Status: AC
  Administered 2024-05-12: 2 g via INTRAVENOUS
  Filled 2024-05-12: qty 12.5

## 2024-05-12 MED ORDER — VANCOMYCIN HCL IN DEXTROSE 1-5 GM/200ML-% IV SOLN: 1000.0000 mg | Freq: Once | INTRAVENOUS | Status: DC

## 2024-05-12 MED ORDER — IOHEXOL 350 MG/ML SOLN
75.0000 mL | Freq: Once | INTRAVENOUS | Status: AC | PRN
  Administered 2024-05-12: 75 mL via INTRAVENOUS

## 2024-05-12 MED ORDER — RIVAROXABAN 10 MG PO TABS: 20.0000 mg | ORAL_TABLET | Freq: Every day | ORAL | Status: DC

## 2024-05-12 MED ORDER — ACETAMINOPHEN 500 MG PO TABS
500.0000 mg | ORAL_TABLET | Freq: Four times a day (QID) | ORAL | Status: DC | PRN
  Administered 2024-05-13: 500 mg via ORAL
  Filled 2024-05-12: qty 1

## 2024-05-12 MED ORDER — TAMSULOSIN HCL 0.4 MG PO CAPS
0.4000 mg | ORAL_CAPSULE | Freq: Every day | ORAL | Status: DC
  Administered 2024-05-13 – 2024-05-16 (×4): 0.4 mg via ORAL
  Filled 2024-05-12 (×4): qty 1

## 2024-05-12 MED ORDER — SORBITOL 70 % SOLN: 30.0000 mL | Freq: Every day | Status: DC | PRN

## 2024-05-12 MED ORDER — SENNA 8.6 MG PO TABS
1.0000 | ORAL_TABLET | Freq: Every day | ORAL | Status: DC
  Administered 2024-05-14 – 2024-05-16 (×3): 8.6 mg via ORAL
  Filled 2024-05-12 (×3): qty 1

## 2024-05-12 MED ORDER — MELATONIN 5 MG PO TABS
10.0000 mg | ORAL_TABLET | Freq: Every day | ORAL | Status: DC
  Administered 2024-05-13 – 2024-05-16 (×4): 10 mg via ORAL
  Filled 2024-05-12 (×4): qty 2

## 2024-05-12 MED ORDER — VANCOMYCIN HCL 1500 MG/300ML IV SOLN
1500.0000 mg | Freq: Once | INTRAVENOUS | Status: AC
  Administered 2024-05-12: 1500 mg via INTRAVENOUS
  Filled 2024-05-12: qty 300

## 2024-05-12 MED ORDER — ALBUTEROL SULFATE (2.5 MG/3ML) 0.083% IN NEBU: 2.5000 mg | INHALATION_SOLUTION | RESPIRATORY_TRACT | Status: DC | PRN

## 2024-05-12 MED ORDER — ONDANSETRON HCL 4 MG/2ML IJ SOLN
4.0000 mg | Freq: Four times a day (QID) | INTRAMUSCULAR | Status: DC | PRN
Start: 2024-05-12 — End: 2024-05-17

## 2024-05-12 NOTE — ED Provider Notes (Incomplete)
 [place] EMERGENCY DEPARTMENT AT [facility] Provider Note   CSN: [id number] Arrival date & time: 05/12/24  1853   Patient presents with: Altered Mental Status, Shortness of Breath, and Code Sepsis   Jordan [name] is a 83 y.o. male with PMHx of Parkinson disease, CVA, HFpEF, pacemaker, history of DVT on Xarelto , type 2 diabetes mellitus, orthostatic hypotension, OSA on CPAP, and metastatic neuroendocrine tumor who presents from his facility for concern for AMS and hypoxia on room air to mid 80s starting today.  Patient was minimally responsive to questioning according to facility staff per EMS and was overall very confused. Patient's baseline GCS is 14 but is reportedly usually oriented to at least self and general context.  Patient recently admitted for strokelike symptoms 5/21-5/31 and was found to have UTI on day of discharge.    Prior to Admission medications   Medication Sig Start Date End Date Taking? Authorizing Provider  acetaminophen  (TYLENOL ) 500 MG tablet Take 500-1,000 mg by mouth every 6 (six) hours as needed for moderate pain or mild pain.    [provider]  atorvastatin  (LIPITOR) 20 MG tablet Take 20 mg by mouth daily. 03/16/24   [provider]  b complex vitamins capsule Take 1 capsule by mouth at bedtime.    [provider]  bifidobacterium infantis (ALIGN) capsule Take 1 capsule by mouth daily.    [provider]  Camphor-Menthol-Methyl Sal (HM SALONPAS PAIN RELIEF EX) Apply 1 patch topically daily as needed (Back pain).    [provider]  Carbidopa -Levodopa  ER (SINEMET  CR) 25-100 MG tablet controlled release Take 1 tablet by mouth 3 (three) times daily with meals. 03/31/24   Raenelle Coria, MD  Cholecalciferol  (VITAMIN D ) 50 MCG (2000 UT) CAPS Take 2,000 Units by mouth at bedtime.    [provider]  clotrimazole-betamethasone (LOTRISONE) cream Apply 1 Application topically daily as needed (Rash). 06/21/22    [provider]  feeding supplement, GLUCERNA SHAKE, (GLUCERNA SHAKE) LIQD Take 237 mLs by mouth 2 (two) times daily between meals. Alternate with ensure Diabetic    [provider]  JANUVIA 100 MG tablet Take 100 mg by mouth daily. 03/16/24   [provider]  lidocaine  (LIDODERM ) 5 % Place 1 patch onto the skin daily as needed. Purchase over the counter. On for 12 hours and off for 12 hours 01/18/22   Love, Sharlet RAMAN, PA-C  Melatonin 10 MG TABS Take 5 mg by mouth at bedtime.    [provider]  metFORMIN  (GLUCOPHAGE ) 500 MG tablet Take 1,000 mg by mouth 2 (two) times daily.    [provider]  midodrine  (PROAMATINE ) 5 MG tablet Take 1 tablet (5 mg total) by mouth 2 (two) times daily with a meal. HOLD IF SBP >130 03/02/24   Ghimire, Donalda HERO, MD  modafinil  (PROVIGIL ) 200 MG tablet TAKE 1 TABLET BY MOUTH EVERY DAY Patient taking differently: Take 100 mg by mouth daily. 12/12/23   Young, Reggy D, MD  NITROSTAT  0.4 MG SL tablet PLACE 1 TABLET (0.4 MG TOTAL) UNDER THE TONGUE EVERY 5 (FIVE) MINUTES AS NEEDED FOR CHEST PAIN. 03/31/16   Claudene Victory ORN, MD  Omega-3 Fatty Acids (OMEGA 3 PO) Take 1,280 mg by mouth at bedtime.    [provider]  OVER THE COUNTER MEDICATION Take 72 mg by mouth in the morning and at bedtime. Ferrasorb    [provider]  Pimavanserin Tartrate (NUPLAZID) 10 MG TABS Take 10 mg by mouth  daily.    [provider]  polyethylene glycol (MIRALAX  / GLYCOLAX ) 17 g packet Take 17 g by mouth 2 (two) times daily. Patient taking differently: Take 17 g by mouth daily as needed. 03/31/24   Ghimire, Kuber, MD  polyvinyl alcohol  (LIQUIFILM TEARS) 1.4 % ophthalmic solution Place 1 drop into both eyes as needed for dry eyes. 01/18/22   Love, Sharlet RAMAN, PA-C  rivaroxaban  (XARELTO ) 20 MG TABS tablet Take 1 tablet (20 mg total) by mouth daily. 01/02/24   Boscia, Heather E, NP  rivastigmine  (EXELON ) 9.5 mg/24hr Place 9.5 mg onto the skin  daily.    [provider]  senna (SENOKOT) 8.6 MG TABS tablet Take 1 tablet (8.6 mg total) by mouth daily. 04/01/24   Raenelle Coria, MD  tamsulosin  (FLOMAX ) 0.4 MG CAPS capsule Take 0.4 mg by mouth at bedtime. 01/21/24   [provider]  Vitamin D , Ergocalciferol , (DRISDOL ) 1.25 MG (50000 UNIT) CAPS capsule TAKE 1 CAPSULE (50,000 UNITS TOTAL) BY MOUTH EVERY 7 (SEVEN) DAYS Patient taking differently: Take 1,250 Units by mouth daily. 05/06/22   Raulkar, Sven SQUIBB, MD    Allergies: Amantadines, Clonazepam , Lisinopril , and Zoloft  [sertraline ]     Updated Vital Signs BP 127/65   Pulse 73   Temp (S) (!) 101.2 F (38.4 C) (Rectal)   Resp 20   SpO2 98%   Physical Exam Vitals reviewed.  Constitutional:      Appearance: He is obese. He is toxic-appearing. He is not diaphoretic.     Comments: Arousable for a few seconds at a time, mumbling incoherent responses to questions  HENT:     Head: Normocephalic and atraumatic.     Nose: Nose normal. No rhinorrhea.     Mouth/Throat:     Mouth: Mucous membranes are moist.     Pharynx: Oropharynx is clear.  Eyes:     Extraocular Movements: Extraocular movements intact.     Conjunctiva/sclera: Conjunctivae normal.  Cardiovascular:     Rate and Rhythm: Normal rate and regular rhythm.     Heart sounds: No murmur heard.    No gallop.  Pulmonary:     Effort: Pulmonary effort is normal. No respiratory distress.     Comments: Diffuse mild coarse lung sounds Abdominal:     Palpations: Abdomen is soft.     Tenderness: There is no abdominal tenderness. There is no guarding.  Genitourinary:    Penis: Normal.      Testes: Normal.  Musculoskeletal:        General: No deformity.     Cervical back: Neck supple.     Right lower leg: No edema.     Left lower leg: No edema.  Skin:    General: Skin is warm and dry.     Capillary Refill: Capillary refill takes less than 2 seconds.     Coloration: Skin is pale. Skin is not jaundiced.   Neurological:     Mental Status: He is lethargic, disoriented and confused.     GCS: GCS eye subscore is 3. GCS verbal subscore is 2. GCS motor subscore is 5.     Cranial Nerves: No facial asymmetry.     Motor: No seizure activity.     Deep Tendon Reflexes: Babinski sign absent on the right side. Babinski sign absent on the left side.     Comments: Patient unable to stay awake long enough for detailed neuroexam     (all labs ordered are listed, but only abnormal results are displayed)  Labs Reviewed  COMPREHENSIVE METABOLIC PANEL WITH GFR - Abnormal; Notable for the following components:      Result Value   Glucose, Bld 150 (*)    Total Bilirubin 1.3 (*)    GFR, Estimated 57 (*)    All other components within normal limits  CBC WITH DIFFERENTIAL/PLATELET - Abnormal; Notable for the following components:   WBC 13.6 (*)    Neutro Abs 11.9 (*)    All other components within normal limits  URINALYSIS, W/ REFLEX TO CULTURE (INFECTION SUSPECTED) - Abnormal; Notable for the following components:   Color, Urine AMBER (*)    APPearance CLOUDY (*)    Hgb urine dipstick LARGE (*)    Ketones, ur 20 (*)    Protein, ur 100 (*)    Nitrite POSITIVE (*)    Leukocytes,Ua MODERATE (*)    Bacteria, UA MANY (*)    All other components within normal limits  I-STAT VENOUS BLOOD GAS, ED - Abnormal; Notable for the following components:   pCO2, Ven 36.2 (*)    pO2, Ven 71 (*)    Calcium , Ion 1.11 (*)    All other components within normal limits  RESP PANEL BY RT-PCR (RSV, FLU A&B, COVID)  RVPGX2  CULTURE, BLOOD (ROUTINE X 2)  CULTURE, BLOOD (ROUTINE X 2)  URINE CULTURE  I-STAT CG4 LACTIC ACID, ED  I-STAT CG4 LACTIC ACID, ED    EKG: EKG Interpretation Date/Time:  Saturday May 12 2024 19:01:59 EDT Ventricular Rate:  71 PR Interval:  137 QRS Duration:  136 QT Interval:  414 QTC Calculation: 450 R Axis:   -53  Text Interpretation: Sinus or ectopic atrial rhythm Right bundle branch block  LVH with IVCD and secondary repol abnrm No significant change since last tracing Confirmed by Patt Alm DEL [phone]) on 05/12/2024 7:08:45 PM  Radiology: [facility] Chest Port 1 View Result Date: 05/12/2024 CLINICAL DATA:  Possible sepsis EXAM: PORTABLE CHEST 1 VIEW COMPARISON:  02/25/2024 FINDINGS: Cardiac shadow is stable. Aortic calcifications are noted. Pacing device is again seen. The overall inspiratory effort is poor. Crowding of the vascular markings is seen although no focal infiltrate is noted. No bony abnormality is noted. IMPRESSION: No acute abnormality noted despite the poor inspiratory effort. Electronically Signed   By: Oneil Devonshire M.D.   On: 05/12/2024 19:54     Medications Ordered in the ED  lactated ringers  infusion (has no administration in time range)  lactated ringers  bolus 1,000 mL (1,000 mLs Intravenous New Bag/Given 05/12/24 2032)    And  lactated ringers  bolus 1,000 mL (1,000 mLs Intravenous New Bag/Given 05/12/24 2204)    And  lactated ringers  bolus 1,000 mL (1,000 mLs Intravenous New Bag/Given 05/12/24 2204)  vancomycin  (VANCOREADY) IVPB 1500 mg/300 mL (has no administration in time range)  ceFEPIme  (MAXIPIME ) 2 g in sodium chloride  0.9 % 100 mL IVPB (0 g Intravenous Stopped 05/12/24 2107)    Clinical Course as of 05/12/24 2256  Sat May 12, 2024  2109 DG Chest [place] 1 View No acute abnormality noted despite the poor inspiratory effort [AD]  2156 Urinalysis, w/ Reflex to Culture (Infection Suspected) -Urine, Clean Catch(!) Infectious UA [AD]  2159 CBC with Differential(!) Leukocytosis to 13.6 otherwise unremarkable [AD]  2222 Derril)(S): 101.2 F (38.4 C) [AD]  2255 Comprehensive metabolic panel(!) Overall unremarkable. [AD]  2255 Lactic Acid, Venous: 1.9 [AD]  2255 I-Stat venous blood gas, ([facility] ED, [facility], [facility])(!) Mildly decreased pCO2 to 36.2, appropriately compensated with normal pH 7.43 [AD]  Clinical Course User Index [AD] Raoul Rake, MD    Medical  Decision Making Patient with the above history presenting from facility with acute AMS/lethargy and desats to 88% on room air.  Patient has a recent admission 6 weeks ago for encephalopathy and was found to have UTI on last day of admission.  On initial evaluation, patient is lethargic/disoriented but transiently arousable.  He is febrile to 101.31F.  He is otherwise hemodynamically stable on arrival. High concern for sepsis with presumed pulmonary source, though also possible urinary source given patient history of multiple recent UTIs this year. Will initiate code sepsis workup and start on 30cc/kg fluid bolus and vanc/cefepime .   As above in ED course, workup remarkable for mild leukocytosis and infectious UA, chest x-ray with no obvious pneumonia/aspiration, most likely urosepsis.  Will get CT chest abdomen pelvis to further evaluate for possible early aspiration and to rule out abdominal source given limited exam due to patient mental status.  Amount and/or Complexity of Data Reviewed Labs: ordered. Decision-making details documented in ED Course. Radiology: ordered. Decision-making details documented in ED Course.    Details: CT chest abdomen pelvis pending at time of admission ECG/medicine tests: ordered.    Details: Interpretation as above  Risk Prescription drug management. Decision regarding hospitalization.      Final diagnoses:  None    ED Discharge Orders     None

## 2024-05-12 NOTE — ED Triage Notes (Signed)
 Pt here from [facility]  nursing home / rehab , called out for AMS and sats 88 on room air , sats up with O2 ,baseline gcs is 14

## 2024-05-12 NOTE — H&P (Signed)
 History and Physical    Patient: Jordan [name] FMW:[id number] DOB: May 06, 1941 DOA: 05/12/2024 DOS: the patient was seen and examined on 05/12/2024 PCP: [name], MD  Patient coming from: SNF  Chief Complaint:  Chief Complaint  Patient presents with   Altered Mental Status   Shortness of Breath   Code Sepsis   HPI: Jordan [name] is a 83 y.o. 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  The patient's wife reports that he has been going downhill for the last 30 days.  Over the last 2 days he was less alert and his speech was a little bit more garbled.  He actually had an appointment with his neurologist 2 days ago and at that time his Exelon  patch was increased and his Sinemet  decreased. When the patient presented to the hospital he was found to have a fever and O2 sats in the 80s.SABRA  He met sepsis criteria.  His workup in the emergency department found a UTI which she has had before.  The patient was treated with full dose sepsis fluids and IV Vanco and Maxipime  initially.  A CT scan of his chest abdomen and pelvis has been ordered looking for any other sources of infection which is still pending at this time.      Review of Systems: unable to review all systems due to the inability of the patient to answer questions. Past Medical History:  Diagnosis Date   Arthritis    R shoulder, bone spur   Arthritis    hands (03/25/2016)   Chronic diastolic heart failure (HCC)    Chronic lower back pain    Coronary heart disease    Dr Esmeralda Sharps   Diverticulitis 12/2021   DVT (deep venous thrombosis) (HCC) 05/2022   left leg   H/O cardiovascular stress test    perhaps last one was 2009   Heart murmur    12/29/11 echo: mild MR, no AS, trivial TR   History of gout     Hyperlipidemia    Hypertension    saw Dr. HILARIO Sharps last earl;y- 2014, cardiac cath. last done ?2009, blocks seen didn't require any intervention at that point.   Narcolepsy    [name] 04-11-97; Mean Latency 1.33min, SOREM 2   OSA on CPAP    NPSG 12-13-98 AHI 22.7   PONV (postoperative nausea and vomiting)    Presence of permanent cardiac pacemaker    Shortness of breath    with exertion    Stroke (HCC)    Type II diabetes mellitus (HCC)    Past Surgical History:  Procedure Laterality Date   CARDIAC CATHETERIZATION  2009?   EP IMPLANTABLE DEVICE N/A 03/25/2016   Procedure: Pacemaker Implant - Dual Chamber;  Surgeon: Danelle LELON Birmingham, MD;  Location: [facility] INVASIVE CV LAB;  Service: Cardiovascular;  Laterality: N/A;   FRACTURE SURGERY     HIP FRACTURE SURGERY     INSERT / REPLACE / REMOVE PACEMAKER  03/24/2016   INTRAMEDULLARY (IM) NAIL INTERTROCHANTERIC Left 02/08/2022   Procedure: INTRAMEDULLARY (IM) NAIL INTERTROCHANTRIC;  Surgeon: Addie Cordella Hamilton, MD;  Location: [facility] OR;  Service: Orthopedics;  Laterality: Left;   IR KYPHO LUMBAR INC FX REDUCE BONE BX UNI/BIL CANNULATION INC/IMAGING  12/29/2021   IR RADIOLOGIST EVAL & MGMT  12/15/2021   IR RADIOLOGY PERIPHERAL  GUIDED IV START  07/14/2022   IR US  GUIDE VASC ACCESS RIGHT  07/15/2022   LEFT AND RIGHT HEART CATHETERIZATION WITH CORONARY ANGIOGRAM N/A 06/05/2014   Procedure: LEFT AND RIGHT HEART CATHETERIZATION WITH CORONARY ANGIOGRAM;  Surgeon: Victory LELON Claudene DOUGLAS, MD;  Location: [facility] CATH LAB;  Service: Cardiovascular;  Laterality: N/A;   NASAL FRACTURE SURGERY     SHOULDER ARTHROSCOPY WITH ROTATOR CUFF REPAIR AND SUBACROMIAL DECOMPRESSION Right 08/16/2013   Procedure: SHOULDER ARTHROSCOPY WITH ROTATOR CUFF REPAIR AND SUBACROMIAL DECOMPRESSION;  Surgeon: Eva Elsie Herring, MD;  Location: [facility] OR;  Service: Orthopedics;  Laterality: Right;  Right shoulder arthroscopy rotator cuff repair, subacromial decompression.   TONSILLECTOMY      Social History:  reports that he quit smoking about 56 years ago. His smoking use included cigarettes. He started smoking about 62 years ago. He has a 15 pack-year smoking history. He has never used smokeless tobacco. He reports current alcohol  use. He reports that he does not use drugs.  Allergies  Allergen Reactions   Amantadines Other (See Comments)    Mental changes   Clonazepam  Other (See Comments)    Mental changes   Lisinopril  Other (See Comments)    Unknown reaction   Zoloft  [Sertraline ] Other (See Comments)    Mental changes    Family History  Problem Relation Age of Onset   Sleep apnea Sister    Colon cancer Sister     Prior to Admission medications   Medication Sig Start Date End Date Taking? Authorizing Provider  acetaminophen  (TYLENOL ) 500 MG tablet Take 500-1,000 mg by mouth every 6 (six) hours as needed for moderate pain or mild pain.    [provider]  atorvastatin  (LIPITOR) 20 MG tablet Take 20 mg by mouth daily. 03/16/24   [provider]  b complex vitamins capsule Take 1 capsule by mouth at bedtime.    [provider]  bifidobacterium infantis (ALIGN) capsule Take 1 capsule by mouth daily.    [provider]  Camphor-Menthol-Methyl Sal (HM SALONPAS PAIN RELIEF EX) Apply 1 patch topically daily as needed (Back pain).    [provider]  Carbidopa -Levodopa  ER (SINEMET  CR) 25-100 MG tablet controlled release Take 1 tablet by mouth 3 (three) times daily with meals. 03/31/24   Raenelle Coria, MD  Cholecalciferol  (VITAMIN D ) 50 MCG (2000 UT) CAPS Take 2,000 Units by mouth at bedtime.    [provider]  clotrimazole-betamethasone (LOTRISONE) cream Apply 1 Application topically daily as needed (Rash). 06/21/22   [provider]  feeding supplement, GLUCERNA SHAKE, (GLUCERNA SHAKE) LIQD Take 237 mLs by mouth 2 (two) times daily between meals. Alternate with ensure Diabetic    [provider]  JANUVIA  100 MG tablet Take 100 mg by mouth daily. 03/16/24   [provider]  lidocaine  (LIDODERM ) 5 % Place 1 patch onto the skin daily as needed. Purchase over the counter. On for 12 hours and off for 12 hours 01/18/22   Love, Sharlet RAMAN, PA-C  Melatonin 10 MG TABS Take 5 mg by mouth at bedtime.    [provider]  metFORMIN  (GLUCOPHAGE ) 500 MG tablet Take 1,000 mg by mouth 2 (two) times daily.    [provider]  midodrine  (PROAMATINE ) 5 MG tablet Take 1 tablet (5 mg total) by mouth 2 (two) times daily with a meal. HOLD IF SBP >130 03/02/24   Ghimire, Donalda HERO, MD  modafinil  (PROVIGIL ) 200 MG tablet TAKE 1 TABLET BY MOUTH EVERY DAY Patient  taking differently: Take 100 mg by mouth daily. 12/12/23   Young, Reggy D, MD  NITROSTAT  0.4 MG SL tablet PLACE 1 TABLET (0.4 MG TOTAL) UNDER THE TONGUE EVERY 5 (FIVE) MINUTES AS NEEDED FOR CHEST PAIN. 03/31/16   Claudene Victory ORN, MD  Omega-3 Fatty Acids (OMEGA 3 PO) Take 1,280 mg by mouth at bedtime.    [provider]  OVER THE COUNTER MEDICATION Take 72 mg by mouth in the morning and at bedtime. Ferrasorb    [provider]  Pimavanserin Tartrate (NUPLAZID) 10 MG TABS Take 10 mg by mouth daily.    [provider]  polyethylene glycol (MIRALAX  / GLYCOLAX ) 17 g packet Take 17 g by mouth 2 (two) times daily. Patient taking differently: Take 17 g by mouth daily as needed. 03/31/24   Ghimire, Kuber, MD  polyvinyl alcohol  (LIQUIFILM TEARS) 1.4 % ophthalmic solution Place 1 drop into both eyes as needed for dry eyes. 01/18/22   Love, Sharlet RAMAN, PA-C  rivaroxaban  (XARELTO ) 20 MG TABS tablet Take 1 tablet (20 mg total) by mouth daily. 01/02/24   Boscia, Heather E, NP  rivastigmine  (EXELON ) 9.5 mg/24hr Place 9.5 mg onto the skin daily.    [provider]  senna (SENOKOT) 8.6 MG TABS tablet Take 1 tablet (8.6 mg total) by mouth daily. 04/01/24   Raenelle Coria, MD  tamsulosin  (FLOMAX ) 0.4 MG CAPS capsule Take 0.4 mg by mouth at  bedtime. 01/21/24   [provider]  Vitamin D , Ergocalciferol , (DRISDOL ) 1.25 MG (50000 UNIT) CAPS capsule TAKE 1 CAPSULE (50,000 UNITS TOTAL) BY MOUTH EVERY 7 (SEVEN) DAYS Patient taking differently: Take 1,250 Units by mouth daily. 05/06/22   Lorilee Sven SQUIBB, MD    Physical Exam: Vitals:   05/12/24 1906 05/12/24 1915 05/12/24 2131 05/12/24 2135  BP:  (!) 153/80  127/65  Pulse:  76 76 73  Resp:  (!) 22 (!) 24 20  Temp: (S) (!) 101.2 F (38.4 C)     TempSrc: (S) Rectal     SpO2:  99%  98%   Physical Exam:  General: asleep. snoring HEENT: Normocephalic, atraumatic, PERRL Cardiovascular: Normal rate and rhythm. Distal pulses intact. Pulmonary: Normal pulmonary effort, normal breath sounds Gastrointestinal: Nondistended abdomen, soft, non-tender, normoactive bowel sounds Musculoskeletal:Normal ROM, no lower ext edema Lymphadenopathy: No cervical LAD. Skin: Skin is warm and dry. Neuro: asleep.  Did not wake through exam PSYCH: asleep  Data Reviewed:  Results for orders placed or performed during the hospital encounter of 05/12/24 (from the past 24 hours)  Resp panel by RT-PCR (RSV, Flu A&B, Covid) Anterior Nasal Swab     Status: None   Collection Time: 05/12/24  7:49 PM   Specimen: Anterior Nasal Swab  Result Value Ref Range   SARS Coronavirus 2 by RT PCR NEGATIVE NEGATIVE   Influenza A by PCR NEGATIVE NEGATIVE   Influenza B by PCR NEGATIVE NEGATIVE   Resp Syncytial Virus by PCR NEGATIVE NEGATIVE  Comprehensive metabolic panel     Status: Abnormal   Collection Time: 05/12/24  7:49 PM  Result Value Ref Range   Sodium 137 135 - 145 mmol/L   Potassium 4.8 3.5 - 5.1 mmol/L   Chloride 102 98 - 111 mmol/L   CO2 22 22 - 32 mmol/L   Glucose, Bld 150 (H) 70 - 99 mg/dL   BUN 22 8 - 23 mg/dL   Creatinine, Ser 8.75 0.61 - 1.24 mg/dL   Calcium  9.9 8.9 - 10.3 mg/dL  Total Protein 7.5 6.5 - 8.1 g/dL   Albumin 3.6 3.5 - 5.0 g/dL   AST 20 15 - 41 U/L   ALT 6 0 - 44 U/L    Alkaline Phosphatase 94 38 - 126 U/L   Total Bilirubin 1.3 (H) 0.0 - 1.2 mg/dL   GFR, Estimated 57 (L) >60 mL/min   Anion gap 13 5 - 15  CBC with Differential     Status: Abnormal   Collection Time: 05/12/24  7:49 PM  Result Value Ref Range   WBC 13.6 (H) 4.0 - 10.5 K/uL   RBC 5.16 4.22 - 5.81 MIL/uL   Hemoglobin 14.5 13.0 - 17.0 g/dL   HCT 55.1 60.9 - 47.9 %   MCV 86.8 80.0 - 100.0 fL   MCH 28.1 26.0 - 34.0 pg   MCHC 32.4 30.0 - 36.0 g/dL   RDW 84.4 88.4 - 84.4 %   Platelets 215 150 - 400 K/uL   nRBC 0.0 0.0 - 0.2 %   Neutrophils Relative % 88 %   Neutro Abs 11.9 (H) 1.7 - 7.7 K/uL   Lymphocytes Relative 5 %   Lymphs Abs 0.7 0.7 - 4.0 K/uL   Monocytes Relative 7 %   Monocytes Absolute 0.9 0.1 - 1.0 K/uL   Eosinophils Relative 0 %   Eosinophils Absolute 0.1 0.0 - 0.5 K/uL   Basophils Relative 0 %   Basophils Absolute 0.0 0.0 - 0.1 K/uL   Immature Granulocytes 0 %   Abs Immature Granulocytes 0.04 0.00 - 0.07 K/uL  I-Stat Lactic Acid, ED     Status: None   Collection Time: 05/12/24  7:58 PM  Result Value Ref Range   Lactic Acid, Venous 1.9 0.5 - 1.9 mmol/L  I-Stat venous blood gas, ([facility] ED, [facility], [facility])     Status: Abnormal   Collection Time: 05/12/24  7:58 PM  Result Value Ref Range   pH, Ven 7.430 7.25 - 7.43   pCO2, Ven 36.2 (L) 44 - 60 mmHg   pO2, Ven 71 (H) 32 - 45 mmHg   Bicarbonate 24.0 20.0 - 28.0 mmol/L   TCO2 25 22 - 32 mmol/L   O2 Saturation 95 %   Acid-Base Excess 0.0 0.0 - 2.0 mmol/L   Sodium 136 135 - 145 mmol/L   Potassium 4.6 3.5 - 5.1 mmol/L   Calcium , Ion 1.11 (L) 1.15 - 1.40 mmol/L   HCT 42.0 39.0 - 52.0 %   Hemoglobin 14.3 13.0 - 17.0 g/dL   Sample type VENOUS   Urinalysis, w/ Reflex to Culture (Infection Suspected) -Urine, Clean Catch     Status: Abnormal   Collection Time: 05/12/24  9:36 PM  Result Value Ref Range   Specimen Source URINE, CLEAN CATCH    Color, Urine AMBER (A) YELLOW   APPearance CLOUDY (A) CLEAR   Specific Gravity, Urine 1.019  1.005 - 1.030   pH 5.0 5.0 - 8.0   Glucose, UA NEGATIVE NEGATIVE mg/dL   Hgb urine dipstick LARGE (A) NEGATIVE   Bilirubin Urine NEGATIVE NEGATIVE   Ketones, ur 20 (A) NEGATIVE mg/dL   Protein, ur 899 (A) NEGATIVE mg/dL   Nitrite POSITIVE (A) NEGATIVE   Leukocytes,Ua MODERATE (A) NEGATIVE   RBC / HPF >50 0 - 5 RBC/hpf   WBC, UA >50 0 - 5 WBC/hpf   Bacteria, UA MANY (A) NONE SEEN   Squamous Epithelial / HPF 0-5 0 - 5 /HPF   WBC Clumps PRESENT    Mucus PRESENT  Hyaline Casts, UA PRESENT      Assessment and Plan: Sepsis and UTI -  - CT of chest abdomen pelvis is pending - IV Rocephin  - Full sepsis dose fluids given.  His blood pressure is stable at this time. - Await culture   2.  Parkinson's disease with dementia and hallucinations - Will restart his Nuplazid (recently approved for hallucinations and Parkinson's patients) - Continue his Exelon  patch  3. OSA - continue CPAP nightly  4.  Narcolepsy - continue Provigil   5.  History of atrial fibrillation - continue Xarelto   6. DMT2 - - Will hold metformin . - Continue Januvia - Corrective dose insulin  as needed  7.  Social -the patient will likely need long-term nursing home placement at discharge.   Advance Care Planning:   Code Status: Full Code the patient's wife is his healthcare power of attorney.  She reports that he is full code.  Consults: None  Family Communication: The patient's wife is at bedside and provides the history  Severity of Illness: The appropriate patient status for this patient is INPATIENT. Inpatient status is judged to be reasonable and necessary in order to provide the required intensity of service to ensure the patient's safety. The patient's presenting symptoms, physical exam findings, and initial radiographic and laboratory data in the context of their chronic comorbidities is felt to place them at high risk for further clinical deterioration. Furthermore, it is not anticipated that the  patient will be medically stable for discharge from the hospital within 2 midnights of admission.   * I certify that at the point of admission it is my clinical judgment that the patient will require inpatient hospital care spanning beyond 2 midnights from the point of admission due to high intensity of service, high risk for further deterioration and high frequency of surveillance required.*  Author: ARTHEA CHILD, MD 05/12/2024 11:26 PM  For on call review [email or web address].

## 2024-05-12 NOTE — Sepsis Progress Note (Signed)
 Elink monitoring for the code sepsis protocol.

## 2024-05-13 ENCOUNTER — Encounter (HOSPITAL_COMMUNITY): Payer: Self-pay | Admitting: Internal Medicine

## 2024-05-13 ENCOUNTER — Inpatient Hospital Stay (HOSPITAL_COMMUNITY)

## 2024-05-13 ENCOUNTER — Encounter (HOSPITAL_COMMUNITY)
Admission: EM | Disposition: A | Discharge status: Skilled Nursing Facility | Payer: Self-pay | Source: Skilled Nursing Facility | Attending: Internal Medicine

## 2024-05-13 ENCOUNTER — Inpatient Hospital Stay (HOSPITAL_COMMUNITY): Admitting: Anesthesiology

## 2024-05-13 DIAGNOSIS — R6520 Severe sepsis without septic shock: Secondary | ICD-10-CM | POA: Diagnosis present

## 2024-05-13 DIAGNOSIS — I2510 Atherosclerotic heart disease of native coronary artery without angina pectoris: Secondary | ICD-10-CM

## 2024-05-13 DIAGNOSIS — I5032 Chronic diastolic (congestive) heart failure: Secondary | ICD-10-CM | POA: Diagnosis not present

## 2024-05-13 DIAGNOSIS — N201 Calculus of ureter: Secondary | ICD-10-CM

## 2024-05-13 DIAGNOSIS — B961 Klebsiella pneumoniae [K. pneumoniae] as the cause of diseases classified elsewhere: Secondary | ICD-10-CM | POA: Diagnosis present

## 2024-05-13 DIAGNOSIS — N390 Urinary tract infection, site not specified: Secondary | ICD-10-CM | POA: Diagnosis not present

## 2024-05-13 DIAGNOSIS — I110 Hypertensive heart disease with heart failure: Secondary | ICD-10-CM | POA: Diagnosis not present

## 2024-05-13 DIAGNOSIS — A419 Sepsis, unspecified organism: Secondary | ICD-10-CM | POA: Diagnosis not present

## 2024-05-13 HISTORY — PX: CYSTOSCOPY W/ URETERAL STENT PLACEMENT: SHX1429

## 2024-05-13 LAB — CBC
HCT: 37.3 % — ABNORMAL LOW (ref 39.0–52.0)
Hemoglobin: 12.2 g/dL — ABNORMAL LOW (ref 13.0–17.0)
MCH: 28.5 pg (ref 26.0–34.0)
MCHC: 32.7 g/dL (ref 30.0–36.0)
MCV: 87.1 fL (ref 80.0–100.0)
Platelets: 185 K/uL (ref 150–400)
RBC: 4.28 MIL/uL (ref 4.22–5.81)
RDW: 15.6 % — ABNORMAL HIGH (ref 11.5–15.5)
WBC: 15.2 K/uL — ABNORMAL HIGH (ref 4.0–10.5)
nRBC: 0 % (ref 0.0–0.2)

## 2024-05-13 LAB — BLOOD CULTURE ID PANEL (REFLEXED) - BCID2

## 2024-05-13 LAB — BASIC METABOLIC PANEL WITH GFR
Anion gap: 10 (ref 5–15)
BUN: 21 mg/dL (ref 8–23)
CO2: 21 mmol/L — ABNORMAL LOW (ref 22–32)
Calcium: 9 mg/dL (ref 8.9–10.3)
Chloride: 104 mmol/L (ref 98–111)
Creatinine, Ser: 1.01 mg/dL (ref 0.61–1.24)
GFR, Estimated: >60 mL/min (ref >60)
Glucose, Bld: 138 mg/dL — ABNORMAL HIGH (ref 70–99)
Potassium: 3.8 mmol/L (ref 3.5–5.1)
Sodium: 135 mmol/L (ref 135–145)

## 2024-05-13 LAB — GLUCOSE, CAPILLARY
Glucose-Capillary: 128 mg/dL — ABNORMAL HIGH (ref 70–99)
Glucose-Capillary: 149 mg/dL — ABNORMAL HIGH (ref 70–99)
Glucose-Capillary: 119 mg/dL — ABNORMAL HIGH (ref 70–99)
Glucose-Capillary: 145 mg/dL — ABNORMAL HIGH (ref 70–99)

## 2024-05-13 LAB — MAGNESIUM: Magnesium: 1.4 mg/dL — ABNORMAL LOW (ref 1.7–2.4)

## 2024-05-13 SURGERY — CYSTOSCOPY, WITH RETROGRADE PYELOGRAM AND URETERAL STENT INSERTION
Anesthesia: General | Laterality: Left

## 2024-05-13 MED ORDER — CHLORHEXIDINE GLUCONATE CLOTH 2 % EX PADS
6.0000 | MEDICATED_PAD | Freq: Every day | CUTANEOUS | Status: DC
  Administered 2024-05-14 – 2024-05-16 (×3): 6 via TOPICAL

## 2024-05-13 MED ORDER — CHLORHEXIDINE GLUCONATE 0.12 % MT SOLN: 15.0000 mL | Freq: Once | OROMUCOSAL | Status: DC

## 2024-05-13 MED ORDER — CARBIDOPA-LEVODOPA ER 25-100 MG PO TBCR
1.0000 | EXTENDED_RELEASE_TABLET | Freq: Three times a day (TID) | ORAL | Status: DC
  Administered 2024-05-13 – 2024-05-16 (×10): 1 via ORAL
  Filled 2024-05-13 (×14): qty 1

## 2024-05-13 MED ORDER — INSULIN ASPART 100 UNIT/ML IJ SOLN
0.0000 [IU] | Freq: Three times a day (TID) | INTRAMUSCULAR | Status: DC
  Administered 2024-05-14 (×2): 1 [IU] via SUBCUTANEOUS
  Administered 2024-05-15 – 2024-05-16 (×2): 2 [IU] via SUBCUTANEOUS
  Administered 2024-05-16: 1 [IU] via SUBCUTANEOUS
  Administered 2024-05-16: 3 [IU] via SUBCUTANEOUS

## 2024-05-13 MED ORDER — ATORVASTATIN CALCIUM 10 MG PO TABS
20.0000 mg | ORAL_TABLET | Freq: Every day | ORAL | Status: DC
  Administered 2024-05-13 – 2024-05-16 (×4): 20 mg via ORAL
  Filled 2024-05-13 (×4): qty 2

## 2024-05-13 MED ORDER — POLYETHYLENE GLYCOL 3350 17 G PO PACK: 17.0000 g | PACK | Freq: Every day | ORAL | Status: DC | PRN

## 2024-05-13 MED ORDER — ONDANSETRON HCL 4 MG/2ML IJ SOLN
INTRAMUSCULAR | Status: DC | PRN
  Administered 2024-05-13: 4 mg via INTRAVENOUS

## 2024-05-13 MED ORDER — LIDOCAINE 2% (20 MG/ML) 5 ML SYRINGE
INTRAMUSCULAR | Status: DC | PRN
  Administered 2024-05-13: 60 mg via INTRAVENOUS

## 2024-05-13 MED ORDER — IOHEXOL 300 MG/ML  SOLN
INTRAMUSCULAR | Status: DC | PRN
  Administered 2024-05-13: .0001 mL

## 2024-05-13 MED ORDER — PROPOFOL 10 MG/ML IV BOLUS
INTRAVENOUS | Status: DC | PRN
  Administered 2024-05-13: 100 mg via INTRAVENOUS

## 2024-05-13 MED ORDER — ORAL CARE MOUTH RINSE: 15.0000 mL | Freq: Once | OROMUCOSAL | Status: DC

## 2024-05-13 MED ORDER — SODIUM CHLORIDE 0.9 % IR SOLN
Status: DC | PRN
  Administered 2024-05-13: 1000 mL

## 2024-05-13 MED ORDER — PHENYLEPHRINE HCL-NACL 20-0.9 MG/250ML-% IV SOLN
INTRAVENOUS | Status: DC | PRN
  Administered 2024-05-13: 50 ug/min via INTRAVENOUS

## 2024-05-13 MED ORDER — RIVASTIGMINE 9.5 MG/24HR TD PT24
9.5000 mg | MEDICATED_PATCH | Freq: Every day | TRANSDERMAL | Status: DC
  Administered 2024-05-13 – 2024-05-16 (×4): 9.5 mg via TRANSDERMAL
  Filled 2024-05-13 (×5): qty 1

## 2024-05-13 MED ORDER — MIDAZOLAM HCL 2 MG/2ML IJ SOLN
INTRAMUSCULAR | Status: AC
  Filled 2024-05-13: qty 2

## 2024-05-13 MED ORDER — ROCURONIUM BROMIDE 10 MG/ML (PF) SYRINGE
PREFILLED_SYRINGE | INTRAVENOUS | Status: DC | PRN
  Administered 2024-05-13: 45 mg via INTRAVENOUS

## 2024-05-13 MED ORDER — TRAZODONE HCL 50 MG PO TABS
50.0000 mg | ORAL_TABLET | Freq: Every evening | ORAL | Status: DC | PRN
  Filled 2024-05-13: qty 1

## 2024-05-13 MED ORDER — FENTANYL CITRATE (PF) 100 MCG/2ML IJ SOLN: 25.0000 ug | INTRAMUSCULAR | Status: DC | PRN

## 2024-05-13 MED ORDER — FENTANYL CITRATE (PF) 250 MCG/5ML IJ SOLN
INTRAMUSCULAR | Status: DC | PRN
  Administered 2024-05-13: 50 ug via INTRAVENOUS

## 2024-05-13 MED ORDER — FENTANYL CITRATE (PF) 250 MCG/5ML IJ SOLN
INTRAMUSCULAR | Status: AC
Start: 2024-05-13 — End: 2024-05-13
  Filled 2024-05-13: qty 5

## 2024-05-13 MED ORDER — SUGAMMADEX SODIUM 200 MG/2ML IV SOLN
INTRAVENOUS | Status: DC | PRN
  Administered 2024-05-13: 200 mg via INTRAVENOUS

## 2024-05-13 MED ORDER — LACTATED RINGERS IV SOLN: INTRAVENOUS | Status: DC

## 2024-05-13 MED ORDER — PROPOFOL 500 MG/50ML IV EMUL
INTRAVENOUS | Status: DC | PRN
  Administered 2024-05-13: 100 ug/kg/min via INTRAVENOUS

## 2024-05-13 MED ORDER — CARBIDOPA-LEVODOPA ER 25-100 MG PO TBCR
1.0000 | EXTENDED_RELEASE_TABLET | Freq: Once | ORAL | Status: DC
  Filled 2024-05-13: qty 1

## 2024-05-13 MED ORDER — RIVAROXABAN 20 MG PO TABS
20.0000 mg | ORAL_TABLET | Freq: Every day | ORAL | Status: DC
  Administered 2024-05-13 – 2024-05-16 (×4): 20 mg via ORAL
  Filled 2024-05-13 (×4): qty 1

## 2024-05-13 MED ORDER — INSULIN ASPART 100 UNIT/ML IJ SOLN: 0.0000 [IU] | INTRAMUSCULAR | Status: DC | PRN

## 2024-05-13 MED ORDER — MAGNESIUM SULFATE 2 GM/50ML IV SOLN
2.0000 g | Freq: Once | INTRAVENOUS | Status: AC
  Administered 2024-05-13: 2 g via INTRAVENOUS
  Filled 2024-05-13: qty 50

## 2024-05-13 MED ORDER — QUETIAPINE FUMARATE 25 MG PO TABS
25.0000 mg | ORAL_TABLET | Freq: Every day | ORAL | Status: DC
  Administered 2024-05-13 – 2024-05-16 (×4): 25 mg via ORAL
  Filled 2024-05-13 (×4): qty 1

## 2024-05-13 SURGICAL SUPPLY — 13 items
BAG DRAIN URO-CYSTO SKYTR STRL (DRAIN) ×1 IMPLANT
BAG URINE DRAIN 2000ML AR STRL (UROLOGICAL SUPPLIES) ×1 IMPLANT
CATH FOLEY 2WAY SLVR 5CC 18FR (CATHETERS) IMPLANT
CATH URETL OPEN END 6FR 70 (CATHETERS) IMPLANT
GLOVE BIOGEL M 7.0 STRL (GLOVE) ×1 IMPLANT
GOWN STRL REUS W/TWL LRG LVL3 (GOWN DISPOSABLE) ×1 IMPLANT
GUIDEWIRE STR DUAL SENSOR (WIRE) ×2 IMPLANT
IV NS 1000ML BAXH (IV SOLUTION) ×1 IMPLANT
KIT TURNOVER KIT B (KITS) IMPLANT
MANIFOLD NEPTUNE II (INSTRUMENTS) ×1 IMPLANT
PACK CYSTO (CUSTOM PROCEDURE TRAY) ×1 IMPLANT
STENT CONTOUR 6FRX26X.038 (STENTS) ×1 IMPLANT
TUBE CONNECTING 12X1/4 (SUCTIONS) ×1 IMPLANT

## 2024-05-13 NOTE — Anesthesia Preprocedure Evaluation (Addendum)
 Anesthesia Evaluation  Patient identified by MRN, date of birth, ID band Patient confused    Reviewed: Allergy & Precautions, H&P , NPO status , Patient's Chart, lab work & pertinent test results  History of Anesthesia Complications (+) PONV and history of anesthetic complications  Airway Mallampati: II  TM Distance: >3 FB Neck ROM: Full    Dental no notable dental hx. (+) Teeth Intact, Dental Advisory Given   Pulmonary sleep apnea and Continuous Positive Airway Pressure Ventilation , former smoker   Pulmonary exam normal breath sounds clear to auscultation       Cardiovascular hypertension, + pacemaker  Rhythm:Regular Rate:Normal     Neuro/Psych       Dementia  Neuromuscular disease CVA  negative psych ROS   GI/Hepatic negative GI ROS, Neg liver ROS,,,  Endo/Other  diabetes, Type 2, Oral Hypoglycemic Agents    Renal/GU Renal disease     Musculoskeletal  (+) Arthritis , Osteoarthritis,    Abdominal   Peds  Hematology  (+) Blood dyscrasia, anemia   Anesthesia Other Findings   Reproductive/Obstetrics negative OB ROS                              Anesthesia Physical Anesthesia Plan  ASA: 3  Anesthesia Plan: General   Post-op Pain Management: Minimal or no pain anticipated   Induction: Intravenous  PONV Risk Score and Plan: 4 or greater and Ondansetron , Dexamethasone  and Treatment may vary due to age or medical condition  Airway Management Planned: LMA  Additional Equipment:   Intra-op Plan:   Post-operative Plan: Extubation in OR  Informed Consent: I have reviewed the patients History and Physical, chart, labs and discussed the procedure including the risks, benefits and alternatives for the proposed anesthesia with the patient or authorized representative who has indicated his/her understanding and acceptance.     Dental advisory given  Plan Discussed with:  CRNA  Anesthesia Plan Comments:          Anesthesia Quick Evaluation

## 2024-05-13 NOTE — Transfer of Care (Signed)
 Immediate Anesthesia Transfer of Care Note  Patient: Jordan [name]  Procedure(s) Performed: CYSTOSCOPY WITH URETERAL STENT INSERTION (Left)  Patient Location: PACU  Anesthesia Type:General  Level of Consciousness: awake, drowsy, patient cooperative, and responds to stimulation  Airway & Oxygen Therapy: Patient Spontanous Breathing and Patient connected to face mask oxygen  Post-op Assessment: Report given to RN and Post -op Vital signs reviewed and stable  Post vital signs: Reviewed and stable  Last Vitals:  Vitals Value Taken Time  BP 143/66 05/13/24 13:33  Temp 37.5 C 05/13/24 13:33  Pulse 69 05/13/24 13:37  Resp 21 05/13/24 13:37  SpO2 89 % 05/13/24 13:37  Vitals shown include unfiled device data.  Last Pain:  Vitals:   05/13/24 1152  TempSrc: Oral  PainSc:          Complications: No notable events documented.

## 2024-05-13 NOTE — ED Notes (Signed)
 This nurse called CCMD to have patient monitored.

## 2024-05-13 NOTE — Anesthesia Postprocedure Evaluation (Signed)
 Anesthesia Post Note  Patient: Jordan [name]  Procedure(s) Performed: CYSTOSCOPY WITH URETERAL STENT INSERTION (Left)     Patient location during evaluation: PACU Anesthesia Type: General Level of consciousness: awake and confused Pain management: pain level controlled Vital Signs Assessment: post-procedure vital signs reviewed and stable Respiratory status: spontaneous breathing, nonlabored ventilation, respiratory function stable and patient connected to nasal cannula oxygen Cardiovascular status: blood pressure returned to baseline and stable Postop Assessment: no apparent nausea or vomiting Anesthetic complications: no   No notable events documented.  Last Vitals:  Vitals:   05/13/24 1414 05/13/24 1433  BP: (!) 140/74 138/70  Pulse: 66   Resp: 15   Temp:  37.6 C  SpO2: 94%     Last Pain:  Vitals:   05/13/24 1433  TempSrc: Oral  PainSc: 0-No pain                 Dailen Mcclish,W. EDMOND

## 2024-05-13 NOTE — Op Note (Signed)
 Date of procedure: 05/13/24  Preoperative diagnosis:  Left distal ureteral stones UTI, sepsis from urinary source  Postoperative diagnosis:  Same  Procedure: Cystoscopy, left ureteral stent  Surgeon: Redell Burnet, MD  Anesthesia: General  Complications: None  Intraoperative findings:  Uncomplicated left ureteral stent placement  EBL: None  Specimens: None  Drains: Left 6 French by 26 cm ureteral stent  Indication: Jordan [name] is a 83 y.o. patient with left distal ureteral stones and UTI/sepsis from urinary source who opted for urgent stent placement.  After reviewing the management options for treatment, they elected to proceed with the above surgical procedure(s). We have discussed the potential benefits and risks of the procedure, side effects of the proposed treatment, the likelihood of the patient achieving the goals of the procedure, and any potential problems that might occur during the procedure or recuperation. Informed consent has been obtained.  Description of procedure:  The patient was taken to the operating room and general anesthesia was induced. SCDs were placed for DVT prophylaxis. The patient was placed in the dorsal lithotomy position, prepped and draped in the usual sterile fashion, and preoperative antibiotics(ceftriaxone  in ER) were administered. A preoperative time-out was performed.   A 21 French rigid cystoscope was used to intubate the urethra and a normal-appearing urethra was followed proximally into the bladder.  Prostate was moderate in size.  There was some minimal debris at the base of the bladder that was irrigated free.  No obvious suspicious lesions, and ureteral orifices orthotopic bilaterally.  A sensor wire was used to intubate the left ureteral orifice and advanced easily up to the kidney under fluoroscopic vision.  A 6 French by 26 cm ureteral stent was placed uneventfully with a curl in the kidney as well as in the bladder under direct  vision.  Purulent urine drained through the sideport of the stent.  An 64 French Foley was placed to maximize drainage, 10 mL placed in the balloon.  Disposition: Stable to PACU  Plan: -Foley can be removed when clinically improved -Will coordinate outpatient left ureteroscopy, laser lithotripsy, stent change with Dr. Matilda in 1 to 2 months  Redell Burnet, MD

## 2024-05-13 NOTE — Plan of Care (Signed)
  Problem: Education: Goal: Ability to describe self-care measures that may prevent or decrease complications (Diabetes Survival Skills Education) will improve Outcome: Not Progressing

## 2024-05-13 NOTE — Anesthesia Procedure Notes (Signed)
 Procedure Name: Intubation Date/Time: 05/13/2024 12:56 PM  Performed by: Mollie Olivia [name], CRNAPre-anesthesia Checklist: Patient identified, Emergency Drugs available, Suction available and Patient being monitored Patient Re-evaluated:Patient Re-evaluated prior to induction Oxygen Delivery Method: Circle system utilized Preoxygenation: Pre-oxygenation with 100% oxygen Induction Type: IV induction Ventilation: Mask ventilation without difficulty Laryngoscope Size: Glidescope and 4 Tube type: Oral Tube size: 7.5 mm Number of attempts: 1 Airway Equipment and Method: Stylet and Oral airway Placement Confirmation: ETT inserted through vocal cords under direct vision, positive ETCO2 and breath sounds checked- equal and bilateral Secured at: 23 cm Tube secured with: Tape Dental Injury: Teeth and Oropharynx as per pre-operative assessment  Difficulty Due To: Difficulty was anticipated, Difficult Airway- due to large tongue and Difficult Airway- due to anterior larynx

## 2024-05-13 NOTE — Progress Notes (Signed)
 PHARMACY - PHYSICIAN COMMUNICATION CRITICAL VALUE ALERT - BLOOD CULTURE IDENTIFICATION (BCID)  Jordan [name] is an 83 y.o. male who presented to [facility] on 05/12/2024 with a chief complaint of altered mental status and fever.   Assessment:  Sepsis source suspected urinary. Blood culture - 1 set positive for GNR. BCID showing Klebsiella pneumonia - no resistance detected. Patient on scheduled Ceftriaxone  2g IV every 24 hours.  Name of physician (or Provider) Contacted: Dr. Jens  Current antibiotics: Ceftriaxone  2g IV every 24 hours.   Changes to prescribed antibiotics recommended:  Patient is on recommended antibiotics - No changes needed  Results for orders placed or performed during the hospital encounter of 05/12/24  Blood Culture ID Panel (Reflexed) (Collected: 05/12/2024  8:07 PM)  Result Value Ref Range   Enterococcus faecalis NOT DETECTED NOT DETECTED   Enterococcus Faecium NOT DETECTED NOT DETECTED   Listeria monocytogenes NOT DETECTED NOT DETECTED   Staphylococcus species NOT DETECTED NOT DETECTED   Staphylococcus aureus (BCID) NOT DETECTED NOT DETECTED   Staphylococcus epidermidis NOT DETECTED NOT DETECTED   Staphylococcus lugdunensis NOT DETECTED NOT DETECTED   Streptococcus species NOT DETECTED NOT DETECTED   Streptococcus agalactiae NOT DETECTED NOT DETECTED   Streptococcus pneumoniae NOT DETECTED NOT DETECTED   Streptococcus pyogenes NOT DETECTED NOT DETECTED   A.calcoaceticus-baumannii NOT DETECTED NOT DETECTED   Bacteroides fragilis NOT DETECTED NOT DETECTED   Enterobacterales DETECTED (A) NOT DETECTED   Enterobacter cloacae complex NOT DETECTED NOT DETECTED   Escherichia coli NOT DETECTED NOT DETECTED   Klebsiella aerogenes NOT DETECTED NOT DETECTED   Klebsiella oxytoca NOT DETECTED NOT DETECTED   Klebsiella pneumoniae DETECTED (A) NOT DETECTED   Proteus species NOT DETECTED NOT DETECTED   Salmonella species NOT DETECTED NOT DETECTED   Serratia marcescens  NOT DETECTED NOT DETECTED   Haemophilus influenzae NOT DETECTED NOT DETECTED   Neisseria meningitidis NOT DETECTED NOT DETECTED   Pseudomonas aeruginosa NOT DETECTED NOT DETECTED   Stenotrophomonas maltophilia NOT DETECTED NOT DETECTED   Candida albicans NOT DETECTED NOT DETECTED   Candida auris NOT DETECTED NOT DETECTED   Candida glabrata NOT DETECTED NOT DETECTED   Candida krusei NOT DETECTED NOT DETECTED   Candida parapsilosis NOT DETECTED NOT DETECTED   Candida tropicalis NOT DETECTED NOT DETECTED   Cryptococcus neoformans/gattii NOT DETECTED NOT DETECTED   CTX-M ESBL NOT DETECTED NOT DETECTED   Carbapenem resistance IMP NOT DETECTED NOT DETECTED   Carbapenem resistance KPC NOT DETECTED NOT DETECTED   Carbapenem resistance NDM NOT DETECTED NOT DETECTED   Carbapenem resist OXA 48 LIKE NOT DETECTED NOT DETECTED   Carbapenem resistance VIM NOT DETECTED NOT DETECTED    Jordan [name] 05/13/2024  12:00 PM

## 2024-05-13 NOTE — Consult Note (Addendum)
 Urology Consult   I have been asked to see the patient by Dr. Jens, for evaluation and management of left ureteral stone, UTI, concern for sepsis from urinary source.  Chief Complaint: Confusion, sepsis  HPI:  Jordan [name] is a 83 y.o. 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  He presented to the ER overnight with altered mental status and hypoxia, worsening confusion.  Minimal history able to be obtained from wife, history obtained entirely from chart review and phone call with his wife Burnard.  She reports he has had multiple UTIs over the last 6 months, requiring multiple hospitalizations.  He was previously followed by [facility], recently transferred care to Dr. Matilda with [facility] urology in June 2025.  He has had known left distal ureteral stone since at least March 2025 based on old CT.  My understanding from reading the notes is since these were minimally symptomatic observation was elected.  He also has left renal stones.  CT today shows persistent left distal ureteral stones, left renal stone burden, left perinephric stranding.  Urinalysis appears infected.  Febrile to 101 in the ER with leukocytosis and worsening confusion from his baseline.  PMH: Past Medical History:  Diagnosis Date   Arthritis    R shoulder, bone spur   Arthritis    hands (03/25/2016)   Chronic diastolic heart failure (HCC)    Chronic lower back pain    Coronary heart disease    Dr Esmeralda Sharps   Diverticulitis 12/2021   DVT (deep venous thrombosis) (HCC) 05/2022   left leg   H/O cardiovascular stress test    perhaps last one was 2009   Heart murmur    12/29/11 echo: mild MR, no AS, trivial TR   History of gout    Hyperlipidemia    Hypertension    saw Dr. HILARIO Sharps last earl;y- 2014, cardiac cath. last done ?2009, blocks seen  didn't require any intervention at that point.   Narcolepsy    [name] 04-11-97; Mean Latency 1.31min, SOREM 2   OSA on CPAP    NPSG 12-13-98 AHI 22.7   PONV (postoperative nausea and vomiting)    Presence of permanent cardiac pacemaker    Shortness of breath    with exertion    Stroke (HCC)    Type II diabetes mellitus [facility])     Surgical History: Past Surgical History:  Procedure Laterality Date   CARDIAC CATHETERIZATION  2009?   EP IMPLANTABLE DEVICE N/A 03/25/2016   Procedure: Pacemaker Implant - Dual Chamber;  Surgeon: Danelle LELON Birmingham, MD;  Location: [facility] INVASIVE CV LAB;  Service: Cardiovascular;  Laterality: N/A;   FRACTURE SURGERY     HIP FRACTURE SURGERY     INSERT / REPLACE / REMOVE PACEMAKER  03/24/2016   INTRAMEDULLARY (IM) NAIL INTERTROCHANTERIC Left 02/08/2022   Procedure: INTRAMEDULLARY (IM) NAIL INTERTROCHANTRIC;  Surgeon: Addie Cordella Hamilton, MD;  Location: [facility] OR;  Service: Orthopedics;  Laterality: Left;   IR KYPHO LUMBAR INC FX REDUCE BONE BX UNI/BIL CANNULATION INC/IMAGING  12/29/2021   IR RADIOLOGIST EVAL & MGMT  12/15/2021   IR RADIOLOGY PERIPHERAL GUIDED IV START  07/14/2022   IR US  GUIDE VASC ACCESS RIGHT  07/15/2022   LEFT AND RIGHT HEART CATHETERIZATION WITH CORONARY ANGIOGRAM N/A 06/05/2014   Procedure: LEFT AND RIGHT HEART CATHETERIZATION WITH CORONARY ANGIOGRAM;  Surgeon: Victory LELON Claudene DOUGLAS, MD;  Location: [facility] CATH LAB;  Service: Cardiovascular;  Laterality: N/A;   NASAL FRACTURE SURGERY     SHOULDER ARTHROSCOPY WITH ROTATOR CUFF REPAIR AND SUBACROMIAL DECOMPRESSION Right 08/16/2013   Procedure: SHOULDER ARTHROSCOPY WITH ROTATOR CUFF REPAIR AND SUBACROMIAL DECOMPRESSION;  Surgeon: Eva Elsie Herring, MD;  Location: [facility] OR;  Service: Orthopedics;  Laterality: Right;  Right shoulder arthroscopy rotator cuff repair, subacromial decompression.   TONSILLECTOMY       Allergies:  Allergies  Allergen Reactions   Amantadines Other (See Comments)    Mental changes    Clonazepam  Other (See Comments)    Mental changes   Lisinopril  Other (See Comments)    Unknown reaction   Zoloft  [Sertraline ] Other (See Comments)    Mental changes    Family History: Family History  Problem Relation Age of Onset   Sleep apnea Sister    Colon cancer Sister     Social History:  reports that he quit smoking about 56 years ago. His smoking use included cigarettes. He started smoking about 62 years ago. He has a 15 pack-year smoking history. He has never used smokeless tobacco. He reports current alcohol  use. He reports that he does not use drugs.  ROS: Unable to be obtained secondary to patient condition  Physical Exam: BP (!) 145/83   Pulse 67   Temp 98.4 F (36.9 C)   Resp (!) 21   SpO2 100%    Constitutional: Altered, minimally conversational Cardiovascular: Regular rate and rhythm Respiratory: Clear to auscultation bilaterally GI: Abdomen is soft, nontender, nondistended, no abdominal masses   Laboratory Data: Reviewed in epic, see HPI  Pertinent Imaging: I have personally reviewed the CT scan today showing left distal ureteral stones, left renal stone burden, left perinephric stranding.  Assessment & Plan:   Extremely comorbid and frail 83 year old male with left distal ureteral stone since at least March 2025, recurrent UTIs, now with sepsis from urinary source with worsening confusion and hypoxia, febrile, leukocytosis.  We discussed the need for drainage in the setting of an infected and obstructed system.  A ureteral stent is a small plastic tube that is placed cystoscopically with one end in the kidney and the other end in the bladder that allows the infection from the kidney to drain, and relieves pain from the obstructing stone.  We discussed the risks at length including bleeding, infection, sepsis, death, ureteral injury, and stent related symptoms including urgency/frequency/dysuria/flank pain/gross hematuria.  There is a low, but not 0, risk  of inability to pass the ureteral stent alongside the stone from below which would require percutaneous nephrostomy tube by interventional radiology.  Finally, we discussed possible prolonged hospitalization and recovery, possible temporary Foley catheter placement, and 10 to 14-day course of antibiotics.  We discussed alternatives like observation with high risk of worsening clinical status, recurrent infections.  We reviewed the need for a follow-up procedure for definitive management of their stone when the infection has been treated in 2 to 3 weeks with either ureteroscopy/laser lithotripsy.   Recommendations: - Agree with antibiotics and hospitalist admission - OR today for cystoscopy and left ureteral stent  Redell JAYSON Burnet, MD  Total time spent on the floor was 65 minutes, with greater than 50% spent in counseling and coordination of care with the patient and wife regarding chronic left distal ureteral stones, recurrent UTIs, likely sepsis from urinary source, and recommendation for stent placement for drainage.  [facility] [address] [place], [place] [address] [phone]

## 2024-05-13 NOTE — ED Notes (Signed)
 Urology at bedside.

## 2024-05-13 NOTE — ED Notes (Signed)
 When changing patient's brief, this nurse noted patient had scant amounts of blood mixed with urine. It is noted that the previous nurse performed an in-and-out catheter. This nurse alerted attending Arthea, MD.

## 2024-05-13 NOTE — ED Notes (Signed)
 Attempted to assist patient with breakfast. Patient stating not hungry right now

## 2024-05-13 NOTE — Progress Notes (Addendum)
 Progress Note    Jordan [name]  FMW:[id number] DOB: 1941-06-16  DOA: 05/12/2024 PCP: [name], MD      Brief Narrative:    Medical records reviewed and are as summarized below:  Jordan [name] is a 83 y.o. 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  Reportedly, his condition had been going downhill for about 30 days now.  About 2 days prior to admission, he was noted to be less alert and speech was more garbled.  He recently saw his neurologist and Exelon  patch was increased and Sinemet  was decreased.  In the ED, patient was febrile with rectal temperature of 101.2 F, tachypneic with respirate rate of 22, heart rate was 71, BP 126/97 and O2 sat 100% on 2 L of oxygen.  Urinalysis was consistent with UTI and WBC was 13.6.  Lactate was normal at 1.9.  CT chest abdomen pelvis with contrast:  IMPRESSION: CT of the chest: Mild left basilar atelectasis.   CT of the abdomen and pelvis: Persistent left renal pelvic stone as well as distal left ureteral stones causing mild obstructive change as well as inflammatory changes in the ureteral wall which may be related to underlying UTI.   Stable partially calcified mesenteric lesion centrally best seen today on image number 78 of series 3.   Subtle liver lesions are again seen.   Resolution of previous cystic lesion in the pancreatic head    Assessment/Plan:   Principal Problem:   Severe sepsis (HCC) Active Problems:   Physical deconditioning   Paroxysmal atrial fibrillation (HCC)   Type 2 diabetes mellitus with hyperlipidemia (HCC)   Narcolepsy without cataplexy   Hallucinations, visual   REM behavioral  disorder   Parkinson disease (HCC)   OSA on CPAP   Sepsis secondary to UTI (HCC)   Bacteremia due to Klebsiella pneumoniae   Severe sepsis secondary to acute complicated UTI, Klebsiella pneumoniae bacteremia: Blood culture growing Klebsiella pneumoniae.  Continue IV ceftriaxone .  Follow-up urine and blood cultures.   Left distal ureteral stones: Consulted Dr. Francisca, urologist.  Plan for ureteral stent today.   Acute metabolic encephalopathy on underlying dementia: Continue supportive care   Acute hypoxic respiratory failure: He is on 2 L/min oxygen.  Wean off oxygen as able.   Hypomagnesemia: Magnesium  1.4.  Replete magnesium  with IV magnesium  sulfate and monitor levels.  Paroxysmal atrial fibrillation: Continue Xarelto .  This is okay with urologist, discussed with Dr. Francisca.   Type II DM: Use NovoLog  as needed for hyperglycemia.  Januvia and metformin  on hold.   Comorbidities include OSA on CPAP, narcolepsy, Parkinson's disease with dementia and hallucinations    Diet Order             Diet NPO time specified  Diet effective now                            Consultants: Urologist  Procedures: Plan for left ureteral stent placement today    Medications:    Carbidopa -Levodopa  ER  1 tablet Oral Once   chlorhexidine   15 mL Mouth/Throat Once   Or   mouth rinse  15 mL Mouth Rinse Once   [MAR Hold] insulin  aspart  0-6 Units Subcutaneous TID WC   [MAR Hold] melatonin  10 mg Oral QHS   [MAR Hold] modafinil   100 mg Oral Q breakfast   [MAR Hold] rivaroxaban   20 mg Oral Q supper   [MAR Hold] senna  1 tablet Oral Daily   [MAR Hold] tamsulosin   0.4 mg Oral QHS   Continuous Infusions:  [MAR Hold] cefTRIAXone  (ROCEPHIN )  IV Stopped (05/13/24 1000)   lactated ringers  150 mL/hr at 05/13/24 0513   lactated ringers  10 mL/hr at 05/13/24 1210   [MAR Hold] magnesium  sulfate bolus IVPB       Anti-infectives (From admission, onward)    Start     Dose/Rate  Route Frequency Ordered Stop   05/13/24 1000  [MAR Hold]  cefTRIAXone  (ROCEPHIN ) 2 g in sodium chloride  0.9 % 100 mL IVPB        (MAR Hold since Sun 05/13/2024 at 1147.Hold Reason: Transfer to a Procedural area)   2 g 200 mL/hr over 30 Minutes Intravenous Every 24 hours 05/12/24 2324     05/12/24 1930  vancomycin  (VANCOCIN ) IVPB 1000 mg/200 mL premix  Status:  Discontinued        1,000 mg 200 mL/hr over 60 Minutes Intravenous  Once 05/12/24 1922 05/12/24 1923   05/12/24 1930  ceFEPIme  (MAXIPIME ) 2 g in sodium chloride  0.9 % 100 mL IVPB        2 g 200 mL/hr over 30 Minutes Intravenous  Once 05/12/24 1922 05/12/24 2107   05/12/24 1930  vancomycin  (VANCOREADY) IVPB 1500 mg/300 mL        1,500 mg 150 mL/hr over 120 Minutes Intravenous  Once 05/12/24 1923 05/13/24 0137              Family Communication/Anticipated D/C date and plan/Code Status   DVT prophylaxis: rivaroxaban  (XARELTO ) tablet 20 mg Start: 05/13/24 1700 rivaroxaban  (XARELTO ) tablet 20 mg     Code Status: Full Code  Family Communication: None Disposition Plan: Plan to discharge to nursing home   Status is: Inpatient Remains inpatient appropriate because: Sepsis secondary to UTI       Subjective:   Interval events noted.  He has no complaints.  Of note, he is confused and cannot provide adequate history  Objective:    Vitals:   05/13/24 0830 05/13/24 1027 05/13/24 1152 05/13/24 1204  BP: (!) 145/83  (!) 142/67   Pulse: 67  67   Resp: (!) 21  20   Temp:  99 F (37.2 C) 98.3 F (36.8 C)   TempSrc:  Oral Oral   SpO2: 100%  93%   Weight:   87.1 kg 87.1 kg  Height:   5' 8 (1.727 m) 5' 7.99 (1.727 m)   No data found.   Intake/Output Summary (Last 24 hours) at 05/13/2024 1227 Last data filed at 05/13/2024 1000 Gross per 24 hour  Intake 1200 ml  Output --  Net 1200 ml   Filed Weights   05/13/24 1152 05/13/24 1204  Weight: 87.1 kg 87.1 kg    Exam:  GEN: NAD SKIN: Warm and dry EYES: No  pallor or icterus ENT: MMM CV: RRR PULM: CTA B ABD: soft, ND, NT, +BS CNS: AAO x 1 (oriented to person only), non focal EXT: No edema or tenderness      Data Reviewed:   I have personally reviewed following labs and imaging studies:  Labs: Labs show the following:   Basic Metabolic Panel: Recent Labs  Lab 05/12/24 1949 05/12/24 1958 05/13/24 0437  NA 137 136 135  K 4.8 4.6 3.8  CL 102  --  104  CO2 22  --  21*  GLUCOSE 150*  --  138*  BUN 22  --  21  CREATININE 1.24  --  1.01  CALCIUM  9.9  --  9.0  MG  --   --  1.4*   GFR Estimated Creatinine Clearance: 58.4 mL/min (by C-G formula based on SCr of 1.01 mg/dL). Liver Function Tests: Recent Labs  Lab 05/12/24 1949  AST 20  ALT 6  ALKPHOS 94  BILITOT 1.3*  PROT 7.5  ALBUMIN 3.6   No results for input(s): LIPASE, AMYLASE in the last 168 hours. No results for input(s): AMMONIA in the last 168 hours. Coagulation profile No results for input(s): INR, PROTIME in the last 168 hours.  CBC: Recent Labs  Lab 05/12/24 1949 05/12/24 1958 05/13/24 0437  WBC 13.6*  --  15.2*  NEUTROABS 11.9*  --   --   HGB 14.5 14.3 12.2*  HCT 44.8 42.0 37.3*  MCV 86.8  --  87.1  PLT 215  --  185   Cardiac Enzymes: No results for input(s): CKTOTAL, CKMB, CKMBINDEX, TROPONINI in the last 168 hours. BNP (last 3 results) No results for input(s): PROBNP in the last 8760 hours. CBG: Recent Labs  Lab 05/13/24 1208  GLUCAP 128*   D-Dimer: No results for input(s): DDIMER in the last 72 hours. Hgb A1c: No results for input(s): HGBA1C in the last 72 hours. Lipid Profile: No results for input(s): CHOL, HDL, LDLCALC, TRIG, CHOLHDL, LDLDIRECT in the last 72 hours. Thyroid  function studies: No results for input(s): TSH, T4TOTAL, T3FREE, THYROIDAB in the last 72 hours.  Invalid input(s): FREET3 Anemia work up: No results for input(s): VITAMINB12, FOLATE, FERRITIN, TIBC,  IRON , RETICCTPCT in the last 72 hours. Sepsis Labs: Recent Labs  Lab 05/12/24 1949 05/12/24 1958 05/13/24 0437  WBC 13.6*  --  15.2*  LATICACIDVEN  --  1.9  --     Microbiology Recent Results (from the past 240 hours)  Resp panel by RT-PCR (RSV, Flu A&B, Covid) Anterior Nasal Swab     Status: None   Collection Time: 05/12/24  7:49 PM   Specimen: Anterior Nasal Swab  Result Value Ref Range Status   SARS Coronavirus 2 by RT PCR NEGATIVE NEGATIVE Final   Influenza A by PCR NEGATIVE NEGATIVE Final   Influenza B by PCR NEGATIVE NEGATIVE Final    Comment: (NOTE) The Xpert Xpress SARS-CoV-2/FLU/RSV plus assay is intended as an aid in the diagnosis of influenza from Nasopharyngeal swab specimens and should not be used as a sole basis for treatment. Nasal washings and aspirates are unacceptable for Xpert Xpress SARS-CoV-2/FLU/RSV testing.  Fact Sheet for Patients: BloggerCourse.com  Fact Sheet for Healthcare Providers: SeriousBroker.it  This test is not yet approved or cleared by the [place]  FDA and has been authorized for detection and/or diagnosis of SARS-CoV-2 by FDA under an Emergency Use Authorization (EUA). This EUA will remain in effect (meaning this test can be used) for the duration of the COVID-19 declaration under Section 564(b)(1) of the Act, 21 U.S.C. section 360bbb-3(b)(1), unless the authorization is terminated or revoked.     Resp Syncytial Virus by PCR NEGATIVE NEGATIVE Final    Comment: (NOTE) Fact Sheet for Patients: BloggerCourse.com  Fact Sheet for Healthcare Providers: SeriousBroker.it  This test is not yet approved or cleared by the [place]  FDA and has been authorized for detection and/or diagnosis of  SARS-CoV-2 by FDA under an Emergency Use Authorization (EUA). This EUA will remain in effect (meaning this test can be used) for the  duration of the COVID-19 declaration under Section 564(b)(1) of the Act, 21 U.S.C. section 360bbb-3(b)(1), unless the authorization is terminated or revoked.  Performed at [facility], [address], [place], [place] [address]   Blood Culture (routine x 2)     Status: None (Preliminary result)   Collection Time: 05/12/24  8:07 PM   Specimen: BLOOD  Result Value Ref Range Status   Specimen Description BLOOD LEFT ANTECUBITAL  Final   Special Requests   Final    BOTTLES DRAWN AEROBIC AND ANAEROBIC Blood Culture adequate volume   Culture  Setup Time   Final    GRAM NEGATIVE RODS IN BOTH AEROBIC AND ANAEROBIC BOTTLES CRITICAL RESULT CALLED TO, READ BACK BY AND VERIFIED WITH: PHARMD [name] M. [id number] AT 1155, ADC Performed at [facility], [address], [place], [place] [address]    Culture GRAM NEGATIVE RODS  Final   Report Status PENDING  Incomplete  Blood Culture ID Panel (Reflexed)     Status: Abnormal   Collection Time: 05/12/24  8:07 PM  Result Value Ref Range Status   Enterococcus faecalis NOT DETECTED NOT DETECTED Final   Enterococcus Faecium NOT DETECTED NOT DETECTED Final   Listeria monocytogenes NOT DETECTED NOT DETECTED Final   Staphylococcus species NOT DETECTED NOT DETECTED Final   Staphylococcus aureus (BCID) NOT DETECTED NOT DETECTED Final   Staphylococcus epidermidis NOT DETECTED NOT DETECTED Final   Staphylococcus lugdunensis NOT DETECTED NOT DETECTED Final   Streptococcus species NOT DETECTED NOT DETECTED Final   Streptococcus agalactiae NOT DETECTED NOT DETECTED Final   Streptococcus pneumoniae NOT DETECTED NOT DETECTED Final   Streptococcus pyogenes NOT DETECTED NOT DETECTED Final   A.calcoaceticus-baumannii NOT DETECTED NOT DETECTED Final   Bacteroides fragilis NOT DETECTED NOT DETECTED Final   Enterobacterales DETECTED (A) NOT DETECTED Final    Comment: Enterobacterales represent a large order of gram negative bacteria, not a single  organism. CRITICAL RESULT CALLED TO, READ BACK BY AND VERIFIED WITH: PHARMD [name] M. [id number] AT 1155, ADC    Enterobacter cloacae complex NOT DETECTED NOT DETECTED Final   Escherichia coli NOT DETECTED NOT DETECTED Final   Klebsiella aerogenes NOT DETECTED NOT DETECTED Final   Klebsiella oxytoca NOT DETECTED NOT DETECTED Final   Klebsiella pneumoniae DETECTED (A) NOT DETECTED Final    Comment: CRITICAL RESULT CALLED TO, READ BACK BY AND VERIFIED WITH: PHARMD [name] M. [id number] AT 1155, ADC    Proteus species NOT DETECTED NOT DETECTED Final   Salmonella species NOT DETECTED NOT DETECTED Final   Serratia marcescens NOT DETECTED NOT DETECTED Final   Haemophilus influenzae NOT DETECTED NOT DETECTED Final   Neisseria meningitidis NOT DETECTED NOT DETECTED Final   Pseudomonas aeruginosa NOT DETECTED NOT DETECTED Final   Stenotrophomonas maltophilia NOT DETECTED NOT DETECTED Final   Candida albicans NOT DETECTED NOT DETECTED Final   Candida auris NOT DETECTED NOT DETECTED Final   Candida glabrata NOT DETECTED NOT DETECTED Final   Candida krusei NOT DETECTED NOT DETECTED Final   Candida parapsilosis NOT DETECTED NOT DETECTED Final   Candida tropicalis NOT DETECTED NOT DETECTED Final   Cryptococcus neoformans/gattii NOT DETECTED NOT DETECTED Final   CTX-M ESBL NOT DETECTED NOT DETECTED Final   Carbapenem resistance IMP NOT DETECTED NOT DETECTED Final   Carbapenem resistance KPC NOT DETECTED NOT DETECTED Final  Carbapenem resistance NDM NOT DETECTED NOT DETECTED Final   Carbapenem resist OXA 48 LIKE NOT DETECTED NOT DETECTED Final   Carbapenem resistance VIM NOT DETECTED NOT DETECTED Final    Comment: Performed at [facility], [address], [place], [place] [address]  Blood Culture (routine x 2)     Status: None (Preliminary result)   Collection Time: 05/13/24  4:39 AM   Specimen: BLOOD RIGHT HAND  Result Value Ref Range Status   Specimen Description BLOOD RIGHT HAND  Final    Special Requests   Final    BOTTLES DRAWN AEROBIC AND ANAEROBIC Blood Culture adequate volume   Culture   Final    NO GROWTH < 12 HOURS Performed at [facility], [address], [place], [place] [address]    Report Status PENDING  Incomplete    Procedures and diagnostic studies:  CT CHEST ABDOMEN PELVIS W CONTRAST Result Date: 05/12/2024 CLINICAL DATA:  Fever and cough, possible sepsis EXAM: CT CHEST, ABDOMEN, AND PELVIS WITH CONTRAST TECHNIQUE: Multidetector CT imaging of the chest, abdomen and pelvis was performed following the standard protocol during bolus administration of intravenous contrast. RADIATION DOSE REDUCTION: This exam was performed according to the departmental dose-optimization program which includes automated exposure control, adjustment of the mA and/or kV according to patient size and/or use of iterative reconstruction technique. CONTRAST:  75mL OMNIPAQUE  IOHEXOL  350 MG/ML SOLN COMPARISON:  Chest x-ray from earlier in the same day, CT from 02/02/2024. FINDINGS: CT CHEST FINDINGS Cardiovascular: Atherosclerotic calcifications of the thoracic aorta are noted without aneurysmal dilatation or dissection. Pacing device is again seen. No cardiac enlargement is noted. Heavy coronary calcifications are seen. No large central pulmonary embolus is noted. Mediastinum/Nodes: Thoracic inlet is within normal limits. No hilar or mediastinal adenopathy is noted. The esophagus as visualized is within normal limits. Lungs/Pleura: The lungs are well aerated bilaterally. No focal infiltrate or effusion is noted. Minimal left basilar atelectasis is seen. Few scattered calcified granulomas are noted. Musculoskeletal: No chest wall mass or suspicious bone lesions identified. CT ABDOMEN PELVIS FINDINGS Hepatobiliary: Hypodense lesion with calcification is noted in the posteromedial aspect of the right lobe of the liver. This is stable from the prior exam small cyst is noted on image number 52 of  series 3. This is stable from the prior exam. The lesion in the dome of the liver is not as well appreciated on today's exam. Pancreas: Unremarkable. No pancreatic ductal dilatation or surrounding inflammatory changes. Previously seen cystic lesion in the head is not well appreciated today. Spleen: Normal in size without focal abnormality. Adrenals/Urinary Tract: Adrenal glands are within normal limits. Kidneys are well visualized bilaterally. Normal excretion is noted. Simple cysts are seen bilaterally. No follow-up is recommended. Left renal pelvic stone is noted measuring up to 11 mm. This likely causes some intermittent obstruction at the UPJ. Inflammatory changes surrounding the renal pelvis are noted. The distal left ureter demonstrates 2 small stones measuring 1-2 mm. These are stable in appearance from the prior exam. The bladder is within normal limits. Stomach/Bowel: Scattered diverticular change of the colon is noted without evidence of diverticulitis. The appendix is within normal limits. Small bowel and stomach are within normal limits. Vascular/Lymphatic: Aortic atherosclerosis. No enlarged abdominal or pelvic lymph nodes. Reproductive: Prostate is unremarkable. Other: Central mesenteric soft tissue lesion is again seen and stable with diffuse calcifications within. Musculoskeletal: Postsurgical changes in the left hip are seen. Degenerative changes of lumbar spine are noted. Changes of  prior vertebral augmentation are noted at L2. IMPRESSION: CT of the chest: Mild left basilar atelectasis. CT of the abdomen and pelvis: Persistent left renal pelvic stone as well as distal left ureteral stones causing mild obstructive change as well as inflammatory changes in the ureteral wall which may be related to underlying UTI. Stable partially calcified mesenteric lesion centrally best seen today on image number 78 of series 3. Subtle liver lesions are again seen. Resolution of previous cystic lesion in the  pancreatic head Electronically Signed   By: Oneil Devonshire M.D.   On: 05/12/2024 23:04   DG Chest Port 1 View Result Date: 05/12/2024 CLINICAL DATA:  Possible sepsis EXAM: PORTABLE CHEST 1 VIEW COMPARISON:  02/25/2024 FINDINGS: Cardiac shadow is stable. Aortic calcifications are noted. Pacing device is again seen. The overall inspiratory effort is poor. Crowding of the vascular markings is seen although no focal infiltrate is noted. No bony abnormality is noted. IMPRESSION: No acute abnormality noted despite the poor inspiratory effort. Electronically Signed   By: Oneil Devonshire M.D.   On: 05/12/2024 19:54               LOS: 1 day   Bayley Hurn  [facility]   Pager on [email or web address]. If 7PM-7AM, please contact night-coverage at [email or web address]     05/13/2024, 12:27 PM

## 2024-05-13 NOTE — ED Notes (Addendum)
 While checking on the patient to see if he needed to be cleaned, I observed that his brief was soiled with what appeared to be blood-colored material. The registered nurse has been notified.

## 2024-05-14 ENCOUNTER — Encounter (HOSPITAL_COMMUNITY): Payer: Self-pay | Admitting: Urology

## 2024-05-14 ENCOUNTER — Inpatient Hospital Stay: Payer: Medicare (Managed Care) | Admitting: Nurse Practitioner

## 2024-05-14 ENCOUNTER — Inpatient Hospital Stay: Payer: Medicare (Managed Care)

## 2024-05-14 DIAGNOSIS — R6520 Severe sepsis without septic shock: Secondary | ICD-10-CM

## 2024-05-14 DIAGNOSIS — A419 Sepsis, unspecified organism: Secondary | ICD-10-CM | POA: Diagnosis not present

## 2024-05-14 LAB — CBC
HCT: 40 % (ref 39.0–52.0)
Hemoglobin: 13 g/dL (ref 13.0–17.0)
MCH: 27.8 pg (ref 26.0–34.0)
MCHC: 32.5 g/dL (ref 30.0–36.0)
MCV: 85.5 fL (ref 80.0–100.0)
Platelets: 141 K/uL — ABNORMAL LOW (ref 150–400)
RBC: 4.68 MIL/uL (ref 4.22–5.81)
RDW: 15.5 % (ref 11.5–15.5)
WBC: 7.8 K/uL (ref 4.0–10.5)
nRBC: 0 % (ref 0.0–0.2)

## 2024-05-14 LAB — MAGNESIUM: Magnesium: 1.7 mg/dL (ref 1.7–2.4)

## 2024-05-14 LAB — GLUCOSE, CAPILLARY
Glucose-Capillary: 133 mg/dL — ABNORMAL HIGH (ref 70–99)
Glucose-Capillary: 171 mg/dL — ABNORMAL HIGH (ref 70–99)
Glucose-Capillary: 151 mg/dL — ABNORMAL HIGH (ref 70–99)
Glucose-Capillary: 178 mg/dL — ABNORMAL HIGH (ref 70–99)

## 2024-05-14 LAB — BASIC METABOLIC PANEL WITH GFR
Anion gap: 11 (ref 5–15)
BUN: 18 mg/dL (ref 8–23)
CO2: 24 mmol/L (ref 22–32)
Calcium: 9.1 mg/dL (ref 8.9–10.3)
Chloride: 101 mmol/L (ref 98–111)
Creatinine, Ser: 1 mg/dL (ref 0.61–1.24)
GFR, Estimated: >60 mL/min (ref >60)
Glucose, Bld: 162 mg/dL — ABNORMAL HIGH (ref 70–99)
Potassium: 3.9 mmol/L (ref 3.5–5.1)
Sodium: 136 mmol/L (ref 135–145)

## 2024-05-14 MED ORDER — ENSURE PLUS HIGH PROTEIN PO LIQD
237.0000 mL | Freq: Three times a day (TID) | ORAL | Status: DC
  Administered 2024-05-14 – 2024-05-16 (×6): 237 mL via ORAL

## 2024-05-14 MED ORDER — ADULT MULTIVITAMIN W/MINERALS CH
1.0000 | ORAL_TABLET | Freq: Every day | ORAL | Status: DC
  Administered 2024-05-14 – 2024-05-16 (×3): 1 via ORAL
  Filled 2024-05-14 (×3): qty 1

## 2024-05-14 MED ORDER — SALINE SPRAY 0.65 % NA SOLN
1.0000 | NASAL | Status: DC | PRN
  Filled 2024-05-14: qty 44

## 2024-05-14 MED ORDER — LACTATED RINGERS IV SOLN: INTRAVENOUS | Status: DC

## 2024-05-14 NOTE — TOC Initial Note (Addendum)
 Transition of Care [facility]) - Initial/Assessment Note    Patient Details  Name: Jordan [name] MRN: [id number] Date of Birth: May 17, 1941  [facility]) CM/SW Contact:    Luise JAYSON Pan, LCSWA Phone Number: 05/14/2024, 12:09 PM  Clinical Narrative:    CSW spoke with Burnard, pts, spouse, to confirm patient was at [name]. Burnard stated patient was. Awaiting PT/OT eval for recs.   2:09 PM Per [name], CSW will not need to do pre auth for patient to return to SNF.   TOC will continue to follow.                Expected Discharge Plan:  (TBD) Barriers to Discharge: Continued Medical Work up   Patient Goals and CMS Choice Patient states their goals for this hospitalization and ongoing recovery are:: To get better          Expected Discharge Plan and Services In-house Referral: Clinical Social Work     Living arrangements for the past 2 months: Skilled Nursing Facility                                      Prior Living Arrangements/Services Living arrangements for the past 2 months: Skilled Nursing Facility Lives with:: Facility Resident Patient language and need for interpreter reviewed:: Yes Do you feel safe going back to the place where you live?: Yes      Need for Family Participation in Patient Care: Yes (Comment) Care giver support system in place?: Yes (comment) Current home services: DME Criminal Activity/Legal Involvement Pertinent to Current Situation/Hospitalization: No - Comment as needed  Activities of Daily Living      Permission Sought/Granted Permission sought to share information with : Family Supports Permission granted to share information with : No (Patient oriented x1, family contact in chart)  Share Information with NAME: Arik Husmann     Permission granted to share info w Relationship: Spouse  Permission granted to share info w Contact Information: [phone]  Emotional Assessment Appearance:: Appears stated  age Attitude/Demeanor/Rapport: Unable to Assess Affect (typically observed): Unable to Assess Orientation: : Oriented to Self Alcohol  / Substance Use: Not Applicable Psych Involvement: No (comment)  Admission diagnosis:  Acute cystitis without hematuria [N30.00] Sepsis (HCC) [A41.9] Patient Active Problem List   Diagnosis Date Noted   Severe sepsis (HCC) 05/13/2024   Bacteremia due to Klebsiella pneumoniae 05/13/2024   Sepsis secondary to UTI (HCC) 05/12/2024   Stroke-like symptom 03/21/2024   Acute encephalopathy 02/25/2024   OSA on CPAP 02/25/2024   Symptomatic anemia 02/09/2024   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   History of DVT (deep vein thrombosis) 04/29/2022   Closed intertrochanteric fracture of left hip, initial encounter [facility])    Closed left hip fracture (HCC) secondary to fall at home 02/08/2022   Paroxysmal atrial fibrillation (HCC) 02/08/2022   Debility 01/08/2022   Constipation 01/07/2022   Physical deconditioning 01/07/2022   Acute metabolic encephalopathy 01/04/2022   Hypomagnesemia 01/04/2022   Pancreatitis 12/30/2021   Parkinson's disease (HCC) 12/21/2021   Parkinson disease (HCC) 12/18/2021   Frequent falls 12/18/2021   History of CVA (cerebrovascular accident) 12/18/2021   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   Hallucinations, visual 07/30/2021   Acetabulum fracture, left (HCC) 06/25/2021   Acute hip pain, left 06/24/2021   Fall at home, initial encounter 06/24/2021   Leukocytosis 06/24/2021   Chronic diastolic heart failure (HCC)  Sinusitis, acute maxillary 06/24/2020   AKI (acute kidney injury) (HCC)    Labile blood glucose    Cough    Benign essential HTN    Skin lesion of hand    Hypoalbuminemia due to protein-calorie malnutrition (HCC)    Controlled type 2 diabetes mellitus with hyperglycemia, without long-term current use of insulin  (HCC)    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   Morbid obesity (HCC)    Acute  blood loss anemia    Acute thalamic infarction (HCC) 04/15/2020   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   Type 2 diabetes mellitus with hyperlipidemia (HCC) 02/17/2018   Insomnia 02/01/2018   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   Chronotropic incompetence 07/09/2014   Dyspnea on exertion 03/03/2014   HLD (hyperlipidemia) 12/13/2007   Essential hypertension 12/13/2007   Class 1 obesity 12/05/2007   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   Coronary atherosclerosis 12/05/2007   PCP:  [name], MD Pharmacy:   [facility] [phone] [address], [place] - [address] AT [facility] [address] [place] [address] Phone: [phone] Fax: [phone]  [facility] - [place], [place] - [address] [place] [address] Phone: [phone] Fax: [phone]  [facility] - [place], [place] - [address] [place] [address] Phone: [phone] Fax: [phone]     Social Drivers of Health (SDOH) Social History: SDOH Screenings   Food Insecurity: No Food Insecurity (05/13/2024)  Housing: Low Risk  (05/13/2024)  Transportation Needs: No Transportation Needs (05/13/2024)  Utilities: Not At Risk (05/13/2024)  Depression (PHQ2-9): Low Risk  (04/12/2023)  Social Connections: Unknown (05/13/2024)  Tobacco Use: Medium Risk (05/13/2024)   SDOH Interventions:     Readmission Risk Interventions     No data to display

## 2024-05-14 NOTE — Plan of Care (Incomplete)
   Problem: Education: Goal: Ability to describe self-care measures that may prevent or decrease complications (Diabetes Survival Skills Education) will improve Outcome: Progressing   Problem: Nutritional: Goal: Maintenance of adequate nutrition will improve Outcome: Progressing

## 2024-05-14 NOTE — Progress Notes (Signed)
 Per MD Dana pt will continue (peri-op) Foley catheter until pt is more alert then will do urine void trial. Indication for Foley catheter has been changed to Therapy based on hourly urine output monitoring and documentation for critical condition.

## 2024-05-14 NOTE — Hospital Course (Signed)
 Waking and talking First time I've seen his eyes open Started lr at 100, ef 55% 2 in may

## 2024-05-14 NOTE — Progress Notes (Signed)
 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  In ED he was noted to have fever and met sepsis criteria.  Workup was notable for left distal ureteral stone and inflammatory changes.  Urine exam was notable for infection.  Subjective:  Patient opens his eyes and says he is okay when I speak with him.  Nurse notes this is the first time he has opened his eyes since he has been here.  Patient is coherent when he is speaking with me.  Objective: Vitals:   05/14/24 0355 05/14/24 0709 05/14/24 1134 05/14/24 1533  BP: 133/79 113/69 108/63 125/67  Pulse: 69 80 67   Resp: 18 20 17  (!) 21  Temp: 100.2 F (37.9 C) (!) 100.4 F (38 C) 99.9 F (37.7 C) 99 F (37.2 C)  TempSrc:  Oral Axillary Axillary  SpO2: 99% 96% 95% 96%  Weight: 88.6 kg     Height:        Intake/Output Summary (Last 24 hours) at 05/14/2024 1756 Last data filed at 05/14/2024 1544 Gross per 24 hour  Intake 470 ml  Output 1100 ml  Net -630 ml   Filed Weights   05/13/24 1152 05/13/24 1204 05/14/24 0355  Weight: 87.1 kg 87.1 kg 88.6 kg     Exam:  General: Sleepy patient lying in bed who is arousable by voice alone, patient is coherent when speaking with me. Eyes: sclera anicteric, conjuctiva mild injection bilaterally CVS: S1-S2, regular  Respiratory:  decreased air entry bilaterally secondary to decreased inspiratory effort, rales at bases  GI: NABS, soft, NT  LE: Warm and well-perfused  Data Reviewed:  Basic Metabolic Panel: Recent Labs  Lab 05/12/24 1949 05/12/24 1958 05/13/24 0437 05/14/24 0330  NA 137 136 135 136  K 4.8 4.6 3.8 3.9  CL 102  --  104 101  CO2 22  --  21* 24  GLUCOSE 150*  --   138* 162*  BUN 22  --  21 18  CREATININE 1.24  --  1.01 1.00  CALCIUM  9.9  --  9.0 9.1  MG  --   --  1.4* 1.7    CBC: Recent Labs  Lab 05/12/24 1949 05/12/24 1958 05/13/24 0437 05/14/24 0330  WBC 13.6*  --  15.2* 7.8  NEUTROABS 11.9*  --   --   --   HGB 14.5 14.3 12.2* 13.0  HCT 44.8 42.0 37.3* 40.0  MCV 86.8  --  87.1 85.5  PLT 215  --  185 141*     Scheduled Meds:  atorvastatin   20 mg Oral QHS   Carbidopa -Levodopa  ER  1 tablet Oral TID WC   Chlorhexidine  Gluconate Cloth  6 each Topical Daily   feeding supplement  237 mL Oral TID BM   insulin  aspart  0-6 Units Subcutaneous TID WC   melatonin  10 mg Oral QHS   modafinil   100 mg Oral Q breakfast   multivitamin with minerals  1 tablet Oral Daily   QUEtiapine   25 mg Oral QHS   rivaroxaban   20 mg Oral Q supper   rivastigmine   9.5 mg Transdermal Daily   senna  1 tablet Oral Daily   tamsulosin   0.4 mg Oral QHS   Continuous Infusions:  cefTRIAXone  (ROCEPHIN )  IV 2 g (05/14/24 0955)   lactated ringers  100 mL/hr at 05/14/24 1253     Assessment & Plan:   Complicated UTI Left ureteral stones/obstructive uropathy Klebsiella bacteremia Severe sepsis--resolved Hypoxia likely secondary to sepsis Patient underwent stent placement in left ureter yesterday He is on ceftriaxone  day #3 today Patient persistently febrile, not surprising given ureteral manipulation during stent placement Follow fever curve and leukocytosis Sepsis physiology appears to be resolved Wean patient off oxygen as tolerated  Metabolic encephalopathy Parkinson's dementia History of hallucinations Narcolepsy Patient appears to be improving, is now awake and coherent when speaking with me He had been difficult to arouse on admission Continue present management with quetiapine , melatonin, modafinil   Parkinson's disease Continue Exelon  patch and Sinemet  per home doses  PAF Rate is controlled off any rate control medications Continue Xarelto ,  okayed by urology  DM 2 SSI Januvia and metformin  on hold    DVT prophylaxis: SCD Code Status: Full Family Communication: None today     Studies: DG C-Arm 1-60 Min Result Date: 05/13/2024 CLINICAL DATA:  83 year old male undergoing ureteral stent insertion. EXAM: DG C-ARM 1-60 MIN CONTRAST:  Not specified. FLUOROSCOPY: Fluoroscopy Time:  0 minutes and 5 seconds Radiation Exposure Index (if provided by the fluoroscopic device): 1.9 mGy Number of Acquired Spot Images: 0 COMPARISON:  CT Chest, Abdomen, and Pelvis yesterday. FINDINGS: Single fluoroscopic image of the left abdomen with partially visible tip of cardiac pacemaker lead and L2 vertebral body augmentation. Pigtail catheter projects to the left of the spine. Bulky left nephrolithiasis including in the left renal pelvis yesterday less well visualized. IMPRESSION: Single fluoroscopic image of left abdominal pigtail catheter compatible with proximal portion of double-J left ureteral stent. Electronically Signed   By: VEAR Hurst M.D.   On: 05/13/2024 17:04   CT CHEST ABDOMEN PELVIS W CONTRAST Result Date: 05/12/2024 CLINICAL DATA:  Fever and cough, possible sepsis EXAM: CT CHEST, ABDOMEN, AND PELVIS WITH CONTRAST TECHNIQUE: Multidetector CT imaging of the chest, abdomen and pelvis was performed following the standard protocol during bolus administration of intravenous contrast. RADIATION DOSE REDUCTION: This exam was performed according to the departmental dose-optimization program which includes automated exposure control, adjustment of the mA and/or kV according to patient size and/or use of iterative reconstruction technique. CONTRAST:  75mL OMNIPAQUE  IOHEXOL  350 MG/ML SOLN COMPARISON:  Chest x-ray from earlier in the same day, CT from 02/02/2024. FINDINGS: CT CHEST FINDINGS Cardiovascular: Atherosclerotic calcifications of the thoracic aorta are noted without aneurysmal dilatation or dissection. Pacing device is again seen. No cardiac  enlargement is noted. Heavy coronary calcifications are seen. No large central pulmonary embolus is noted. Mediastinum/Nodes: Thoracic inlet is within normal limits. No hilar or mediastinal adenopathy is noted. The esophagus as visualized is within normal limits. Lungs/Pleura: The lungs are well aerated bilaterally. No focal infiltrate or effusion is noted. Minimal left basilar atelectasis is seen. Few scattered calcified granulomas are noted. Musculoskeletal: No chest wall mass or suspicious bone lesions identified. CT ABDOMEN PELVIS FINDINGS Hepatobiliary: Hypodense lesion with calcification is noted in the posteromedial aspect of the right lobe of the liver. This is stable from the prior exam small cyst is noted on image number 52 of series 3. This is stable from the prior exam. The lesion in the dome of the liver is not as well appreciated on today's exam. Pancreas: Unremarkable. No pancreatic ductal dilatation or surrounding inflammatory changes. Previously seen cystic lesion in the head is not well appreciated today. Spleen: Normal  in size without focal abnormality. Adrenals/Urinary Tract: Adrenal glands are within normal limits. Kidneys are well visualized bilaterally. Normal excretion is noted. Simple cysts are seen bilaterally. No follow-up is recommended. Left renal pelvic stone is noted measuring up to 11 mm. This likely causes some intermittent obstruction at the UPJ. Inflammatory changes surrounding the renal pelvis are noted. The distal left ureter demonstrates 2 small stones measuring 1-2 mm. These are stable in appearance from the prior exam. The bladder is within normal limits. Stomach/Bowel: Scattered diverticular change of the colon is noted without evidence of diverticulitis. The appendix is within normal limits. Small bowel and stomach are within normal limits. Vascular/Lymphatic: Aortic atherosclerosis. No enlarged abdominal or pelvic lymph nodes. Reproductive: Prostate is unremarkable. Other:  Central mesenteric soft tissue lesion is again seen and stable with diffuse calcifications within. Musculoskeletal: Postsurgical changes in the left hip are seen. Degenerative changes of lumbar spine are noted. Changes of prior vertebral augmentation are noted at L2. IMPRESSION: CT of the chest: Mild left basilar atelectasis. CT of the abdomen and pelvis: Persistent left renal pelvic stone as well as distal left ureteral stones causing mild obstructive change as well as inflammatory changes in the ureteral wall which may be related to underlying UTI. Stable partially calcified mesenteric lesion centrally best seen today on image number 78 of series 3. Subtle liver lesions are again seen. Resolution of previous cystic lesion in the pancreatic head Electronically Signed   By: Oneil Devonshire M.D.   On: 05/12/2024 23:04   DG Chest Port 1 View Result Date: 05/12/2024 CLINICAL DATA:  Possible sepsis EXAM: PORTABLE CHEST 1 VIEW COMPARISON:  02/25/2024 FINDINGS: Cardiac shadow is stable. Aortic calcifications are noted. Pacing device is again seen. The overall inspiratory effort is poor. Crowding of the vascular markings is seen although no focal infiltrate is noted. No bony abnormality is noted. IMPRESSION: No acute abnormality noted despite the poor inspiratory effort. Electronically Signed   By: Oneil Devonshire M.D.   On: 05/12/2024 19:54    Principal Problem:   Severe sepsis (HCC) Active Problems:   Physical deconditioning   Paroxysmal atrial fibrillation (HCC)   Type 2 diabetes mellitus with hyperlipidemia (HCC)   Narcolepsy without cataplexy   Hallucinations, visual   REM behavioral disorder   Parkinson disease (HCC)   OSA on CPAP   Sepsis secondary to UTI (HCC)   Bacteremia due to Klebsiella pneumoniae     Evangelyne Loja Vangie Pike, [facility]  If 7PM-7AM, please contact night-coverage [email or web address]   LOS: 2 days

## 2024-05-14 NOTE — Plan of Care (Signed)
  Problem: Nutritional: Goal: Maintenance of adequate nutrition will improve Outcome: Progressing   Problem: Nutrition: Goal: Adequate nutrition will be maintained Outcome: Progressing

## 2024-05-14 NOTE — Progress Notes (Signed)
 Pt. Has his own cpap from home. Family assists pt. With the unit.

## 2024-05-14 NOTE — Progress Notes (Signed)
 Initial Nutrition Assessment  DOCUMENTATION CODES:   Not applicable  INTERVENTION:  -Continue diet as ordered -Add Ensure Plus High Protein TID -Add MVI w/ min -Add Magic Cup TID  -Per MD LR 100ml/hr x 24 hrs for hydration   NUTRITION DIAGNOSIS:   Inadequate oral intake related to acute illness, lethargy/confusion, poor appetite, chronic illness as evidenced by meal completion < 50%, edema.   GOAL:   Patient will meet greater than or equal to 90% of their needs   MONITOR:   PO intake, Supplement acceptance, Labs, Weight trends, Skin  REASON FOR ASSESSMENT:   Diagnosis, LOS (PU)    ASSESSMENT:   Pt admit w/ AMS, SOB. Dx severe sepsis. Hx Parkinson's with hallucinations, dementia, OSA on CPAP, gout, HTN, metastatic neuroendocrine tumor.  Spoke to pt at bedside. Pt continues with lethargy, AMS. Eyes were open and pt was mumbling seemingly some answers, though, unable to understand. Per RN discussion,  pt only eating a couple bites at meals today, though, has been more willing to drink fluids. Will add Ensure Plus High Protein TID to promote adequate intake, add MC x 3. Does have 1 documented meal yesterday at 50%. No noted n/v/c/d or chewing/swallowing difficulties. Last BM 7/11. Pt's weight has been trending downward since 2007 (8 years), large drop of 26 lbs (11.8%) x 6 months, which is severe. Pt does have generalized edema that may be masking additional muscle/fat loss. Pt with pressure ulceration stage 1 to coccyx. MD to start LR @ 100 ml/hr x 24 hrs to prevent dehydration with pt's poor PO intake/AMS. Will continue to monitor, RDN available prn.   Labs BG 133-171 Calcium  ionized 1.11 Bilirubin 1.3  Medications Atorvastatin  Rocephin  SSI LR @100  ml/hr x 24 hrs Seroquel   NUTRITION - FOCUSED PHYSICAL EXAM:  Flowsheet Row Most Recent Value  Orbital Region Mild depletion  Upper Arm Region No depletion  Thoracic and Lumbar Region No depletion  Buccal Region No  depletion  Temple Region Mild depletion  Clavicle Bone Region No depletion  Clavicle and Acromion Bone Region No depletion  Scapular Bone Region No depletion  Dorsal Hand No depletion  Patellar Region Unable to assess  [Unable to assess, pt resistant to movement]  Anterior Thigh Region Unable to assess  [Unable to assess, pt resistant to movement]  Posterior Calf Region Unable to assess  [Unable to assess, pt resistant to movement]  Edema (RD Assessment) Mild  Hair Reviewed  Eyes Reviewed  Mouth Unable to assess  [Pt not following commands]  Skin Reviewed  Nails Reviewed    Diet Order:   Diet Order             DIET DYS 3 Room service appropriate? Yes; Fluid consistency: Thin  Diet effective now                   EDUCATION NEEDS:   Not appropriate for education at this time  Skin:  Skin Assessment: Skin Integrity Issues: Skin Integrity Issues:: Stage I Stage I: Coccyx  Last BM:  7/11  Height:   Ht Readings from Last 1 Encounters:  05/13/24 5' 7.99 (1.727 m)    Weight:   Wt Readings from Last 1 Encounters:  05/14/24 88.6 kg    Ideal Body Weight:     BMI:  Body mass index is 29.71 kg/m.  Estimated Nutritional Needs:   Kcal:  8124-7674 kcal  Protein:  85-105 g  Fluid:  >/=2 L   Ilyssa Grennan Daml-Budig, RDN, LDN Registered Dietitian  Nutritionist RD Inpatient Contact Info in Amion

## 2024-05-15 DIAGNOSIS — A419 Sepsis, unspecified organism: Secondary | ICD-10-CM | POA: Diagnosis not present

## 2024-05-15 DIAGNOSIS — R6520 Severe sepsis without septic shock: Secondary | ICD-10-CM | POA: Diagnosis not present

## 2024-05-15 LAB — GLUCOSE, CAPILLARY
Glucose-Capillary: 139 mg/dL — ABNORMAL HIGH (ref 70–99)
Glucose-Capillary: 147 mg/dL — ABNORMAL HIGH (ref 70–99)
Glucose-Capillary: 240 mg/dL — ABNORMAL HIGH (ref 70–99)
Glucose-Capillary: 167 mg/dL — ABNORMAL HIGH (ref 70–99)

## 2024-05-15 LAB — CULTURE, BLOOD (ROUTINE X 2): Special Requests: ADEQUATE

## 2024-05-15 LAB — URINE CULTURE: Culture: 60000 — AB

## 2024-05-15 MED ORDER — CEFADROXIL 500 MG PO CAPS
1000.0000 mg | ORAL_CAPSULE | Freq: Two times a day (BID) | ORAL | Status: DC
  Administered 2024-05-16 (×2): 1000 mg via ORAL
  Filled 2024-05-15 (×2): qty 2

## 2024-05-15 NOTE — Plan of Care (Signed)
   Problem: Coping: Goal: Ability to adjust to condition or change in health will improve Outcome: Progressing   Problem: Fluid Volume: Goal: Ability to maintain a balanced intake and output will improve Outcome: Progressing

## 2024-05-15 NOTE — NC FL2 (Signed)
 [place]  MEDICAID FL2 LEVEL OF CARE FORM     IDENTIFICATION  Patient Name: Jordan [name] Birthdate: 1941/01/09 Sex: male Admission Date (Current Location): 05/12/2024  [facility] and [place] Number:  Producer, television/film/video and Address:  [facility], [address] [place], [place] [address]      Provider Number: [id number]  Attending Physician Name and Address:  Raenelle Coria, MD  Relative Name and Phone Number:       Current Level of Care: Hospital Recommended Level of Care: Skilled Nursing Facility Prior Approval Number:    Date Approved/Denied:   PASRR Number: [id number]  Discharge Plan: SNF    Current Diagnoses: Patient Active Problem List   Diagnosis Date Noted   Severe sepsis (HCC) 05/13/2024   Bacteremia due to Klebsiella pneumoniae 05/13/2024   Sepsis secondary to UTI (HCC) 05/12/2024   Stroke-like symptom 03/21/2024   Acute encephalopathy 02/25/2024   OSA on CPAP 02/25/2024   Symptomatic anemia 02/09/2024   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   History of DVT (deep vein thrombosis) 04/29/2022   Closed intertrochanteric fracture of left hip, initial encounter [facility])    Closed left hip fracture (HCC) secondary to fall at home 02/08/2022   Paroxysmal atrial fibrillation (HCC) 02/08/2022   Debility 01/08/2022   Constipation 01/07/2022   Physical deconditioning 01/07/2022   Acute metabolic encephalopathy 01/04/2022   Hypomagnesemia 01/04/2022   Pancreatitis 12/30/2021   Parkinson's disease (HCC) 12/21/2021   Parkinson disease (HCC) 12/18/2021   Frequent falls 12/18/2021   History of CVA (cerebrovascular accident) 12/18/2021   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   Hallucinations, visual 07/30/2021   Acetabulum fracture, left (HCC) 06/25/2021   Acute hip pain, left 06/24/2021   Fall at home, initial encounter 06/24/2021   Leukocytosis 06/24/2021   Chronic diastolic heart failure (HCC)    Sinusitis,  acute maxillary 06/24/2020   AKI (acute kidney injury) (HCC)    Labile blood glucose    Cough    Benign essential HTN    Skin lesion of hand    Hypoalbuminemia due to protein-calorie malnutrition (HCC)    Controlled type 2 diabetes mellitus with hyperglycemia, without long-term current use of insulin  (HCC)    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   Morbid obesity (HCC)    Acute blood loss anemia    Acute thalamic infarction (HCC) 04/15/2020   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   Type 2 diabetes mellitus with hyperlipidemia (HCC) 02/17/2018   Insomnia 02/01/2018   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   Chronotropic incompetence 07/09/2014   Dyspnea on exertion 03/03/2014   HLD (hyperlipidemia) 12/13/2007   Essential hypertension 12/13/2007   Class 1 obesity 12/05/2007   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   Coronary atherosclerosis 12/05/2007    Orientation RESPIRATION BLADDER Height & Weight     Self  Normal Continent, Indwelling catheter Weight: 195 lb 15.8 oz (88.9 kg) Height:  5' 7.99 (172.7 cm)  BEHAVIORAL SYMPTOMS/MOOD NEUROLOGICAL BOWEL NUTRITION STATUS      Continent Diet (see dc summary)  AMBULATORY STATUS COMMUNICATION OF NEEDS Skin   Extensive Assist Verbally Normal                       Personal Care Assistance Level of Assistance  Bathing, Feeding, Dressing Bathing Assistance: Maximum assistance Feeding assistance: Limited assistance Dressing Assistance: Maximum assistance     Functional Limitations Info  Hearing, Sight, Speech Sight Info: Impaired (eyeglasses) Hearing Info:  Adequate Speech Info: Adequate    SPECIAL CARE FACTORS FREQUENCY  PT (By licensed PT), OT (By licensed OT)     PT Frequency: 5x week OT Frequency: 5x week            Contractures Contractures Info: Not present    Additional Factors Info  Code Status, Allergies, Insulin  Sliding Scale Code Status Info: Full Allergies Info:  Amantadines, Clonazepam , Lisinopril , Zoloft  (Sertraline )   Insulin  Sliding Scale Info: see dc summary       Current Medications (05/15/2024):  This is the current hospital active medication list Current Facility-Administered Medications  Medication Dose Route Frequency Provider Last Rate Last Admin   acetaminophen  (TYLENOL ) tablet 500 mg  500 mg Oral Q6H PRN Francisca Redell BROCKS, MD   500 mg at 05/13/24 1712   Or   acetaminophen  (TYLENOL ) suppository 650 mg  650 mg Rectal Q6H PRN Francisca Redell BROCKS, MD   650 mg at 05/13/24 0027   albuterol  (PROVENTIL ) (2.5 MG/3ML) 0.083% nebulizer solution 2.5 mg  2.5 mg Nebulization Q2H PRN Francisca Redell BROCKS, MD       atorvastatin  (LIPITOR) tablet 20 mg  20 mg Oral QHS Jens Durand, MD   20 mg at 05/14/24 2005   Carbidopa -Levodopa  ER (SINEMET  CR) 25-100 MG tablet controlled release 1 tablet  1 tablet Oral TID WC Ayiku, Bernard, MD   1 tablet at 05/15/24 1434   [START ON 05/16/2024] cefadroxil  (DURICEF) capsule 1,000 mg  1,000 mg Oral BID Raenelle Coria, MD       Chlorhexidine  Gluconate Cloth 2 % PADS 6 each  6 each Topical Daily Francisca Redell BROCKS, MD   6 each at 05/15/24 1018   feeding supplement (ENSURE PLUS HIGH PROTEIN) liquid 237 mL  237 mL Oral TID BM Chatterjee, Srobona Tublu, MD   237 mL at 05/15/24 1434   insulin  aspart (novoLOG ) injection 0-6 Units  0-6 Units Subcutaneous TID WC Francisca Redell BROCKS, MD   1 Units at 05/14/24 1806   melatonin tablet 10 mg  10 mg Oral QHS Francisca Redell BROCKS, MD   10 mg at 05/14/24 2005   modafinil  (PROVIGIL ) tablet 100 mg  100 mg Oral Q breakfast Francisca Redell C, MD   100 mg at 05/15/24 1018   multivitamin with minerals tablet 1 tablet  1 tablet Oral Daily Chatterjee, Srobona Tublu, MD   1 tablet at 05/15/24 1018   ondansetron  (ZOFRAN ) tablet 4 mg  4 mg Oral Q6H PRN Francisca Redell BROCKS, MD       Or   ondansetron  (ZOFRAN ) injection 4 mg  4 mg Intravenous Q6H PRN Francisca Redell BROCKS, MD       polyethylene glycol (MIRALAX  / GLYCOLAX )  packet 17 g  17 g Oral Daily PRN Jens Durand, MD       QUEtiapine  (SEROQUEL ) tablet 25 mg  25 mg Oral QHS Ayiku, Bernard, MD   25 mg at 05/14/24 2005   rivaroxaban  (XARELTO ) tablet 20 mg  20 mg Oral Q supper Francisca Redell BROCKS, MD   20 mg at 05/14/24 1805   rivastigmine  (EXELON ) 9.5 mg/24hr 9.5 mg  9.5 mg Transdermal Daily Jens Durand, MD   9.5 mg at 05/15/24 1018   senna (SENOKOT) tablet 8.6 mg  1 tablet Oral Daily Francisca Redell C, MD   8.6 mg at 05/15/24 1018   sodium chloride  (OCEAN) 0.65 % nasal spray 1 spray  1 spray Each Nare PRN Chatterjee, Srobona Tublu, MD       sorbitol   70 % solution 30 mL  30 mL Oral Daily PRN Francisca Redell BROCKS, MD       tamsulosin  (FLOMAX ) capsule 0.4 mg  0.4 mg Oral QHS Francisca Redell BROCKS, MD   0.4 mg at 05/14/24 2005   traZODone  (DESYREL ) tablet 50 mg  50 mg Oral QHS PRN Jens Durand, MD         Discharge Medications: Please see discharge summary for a list of discharge medications.  Relevant Imaging Results:  Relevant Lab Results:   Additional Information SS# [id number] [place]

## 2024-05-15 NOTE — Evaluation (Signed)
 Occupational Therapy Evaluation Patient Details Name: Jordan [name] MRN: [id number] DOB: 06-04-1941 Today's Date: 05/15/2024   History of Present Illness   83 yo male adm 05/12/24 from [facility] with AMS, hypoxia, sepsis, UTI.   7/13 cytoscopy with Lt ureteral stent. PMHx: Parkinsons, HLD, OSA, DM, HTN, chronic diastolic HF, CVA, falls, L2 compression fx, Lt hip fx, dementia     Clinical Impressions Pt admitted with the above diagnoses and presents with below problem list. Pt will benefit from continued acute OT to address the below listed deficits and maximize independence with basic ADLs prior to d/c. Unclear PLOF as pt is unable to provide history and no family present at time of eval. Per chart review, pt needs assist with ADLs at baseline. Currently pt needs up to max A with basic ADLs, max +2 assist for LB ADLs and bed mobility. Pt able to stand briefly enough to use Stedy. However pt fatigued quickly and pt returned to bed (vs up to recliner) for pt safety. Pt lethargic, eyes closed but able to interact.       If plan is discharge home, recommend the following:         Functional Status Assessment   Patient has had a recent decline in their functional status and demonstrates the ability to make significant improvements in function in a reasonable and predictable amount of time.     Equipment Recommendations   Other (comment) (defer to next venue)     Recommendations for Other Services         Precautions/Restrictions   Precautions Precautions: Fall Restrictions Weight Bearing Restrictions Per Provider Order: No     Mobility Bed Mobility Overal bed mobility: Needs Assistance Bed Mobility: Supine to Sit     Supine to sit: Mod assist, +2 for physical assistance     General bed mobility comments: multimodal cues. physical assist for all aspects.    Transfers Overall transfer level: Needs assistance Equipment used:  Laurent) Transfers: Sit  to/from Stand Sit to Stand: Max assist, +2 physical assistance, From elevated surface (via Stedy)           General transfer comment: Multimodal cueing and physical assist for all aspects. +2 physical assist. Pt maintains trunk and cervical flexion position. Able to stand just enough for quick placement of Stedy wing seats. Transfer via Lift Equipment: Stedy    Balance Overall balance assessment: Needs assistance Sitting-balance support: Feet supported, Bilateral upper extremity supported Sitting balance-Leahy Scale: Poor Sitting balance - Comments: mostly close min guard to min A for static sitting EOB. Posterior and left lateral lean. Postural control: Posterior lean, Left lateral lean   Standing balance-Leahy Scale: Zero Standing balance comment: Stedy                           ADL either performed or assessed with clinical judgement   ADL Overall ADL's : Needs assistance/impaired Eating/Feeding: Minimal assistance;Bed level   Grooming: Maximal assistance;Bed level   Upper Body Bathing: Maximal assistance;Bed level;Sitting   Lower Body Bathing: Total assistance;Bed level;+2 for physical assistance;Maximal assistance;Sit to/from stand;Sitting/lateral leans   Upper Body Dressing : Maximal assistance;Bed level;Sitting   Lower Body Dressing: Maximal assistance;+2 for physical assistance;Sitting/lateral leans;Sit to/from stand                 General ADL Comments: Able to stand just enough for quick placement of Stedy seat. Originally planned for up to recliner but ultimately opted  for back to bed d/t lethargy and increased fatigue during session.     Vision Baseline Vision/History: 1 Wears glasses       Perception         Praxis         Pertinent Vitals/Pain Pain Assessment Pain Assessment: PAINAD Breathing: normal Negative Vocalization: none Facial Expression: smiling or inexpressive Body Language: relaxed Consolability: no need to  console PAINAD Score: 0     Extremity/Trunk Assessment Upper Extremity Assessment Upper Extremity Assessment: Generalized weakness   Lower Extremity Assessment Lower Extremity Assessment: Defer to PT evaluation       Communication Communication Communication: Impaired Factors Affecting Communication: Hearing impaired;Difficulty expressing self   Cognition Arousal: Lethargic Behavior During Therapy: Flat affect Cognition: History of cognitive impairments                               Following commands: Impaired Following commands impaired: Follows one step commands with increased time     Cueing  General Comments   Cueing Techniques: Verbal cues;Gestural cues;Tactile cues;Visual cues  eyes closed throughout session. Able to open eyes with max cueing. Able to respond and interact with eyes closed.   Exercises     Shoulder Instructions      Home Living Family/patient expects to be discharged to:: Skilled nursing facility                                 Additional Comments: info from last admission, pt unable to give any history or PLOF      Prior Functioning/Environment Prior Level of Function : Needs assist  Cognitive Assist : Mobility (cognitive);ADLs (cognitive)           Mobility Comments: unclear baseline with mobility, pt unable to provide history and no family present ADLs Comments: needs assist with ADLs at baseline per chart review    OT Problem List: Decreased strength;Decreased range of motion;Decreased activity tolerance;Impaired balance (sitting and/or standing);Decreased cognition;Decreased safety awareness;Decreased knowledge of use of DME or AE;Decreased knowledge of precautions   OT Treatment/Interventions: Self-care/ADL training;Therapeutic exercise;DME and/or AE instruction;Therapeutic activities;Cognitive remediation/compensation;Patient/family education;Balance training      OT Goals(Current goals can be found  in the care plan section)   Acute Rehab OT Goals Patient Stated Goal: not stated OT Goal Formulation: With patient Time For Goal Achievement: 05/29/24 Potential to Achieve Goals: Fair   OT Frequency:  Min 2X/week    Co-evaluation PT/OT/SLP Co-Evaluation/Treatment: Yes Reason for Co-Treatment: For patient/therapist safety;To address functional/ADL transfers;Complexity of the patient's impairments (multi-system involvement);Necessary to address cognition/behavior during functional activity   OT goals addressed during session: ADL's and self-care;Strengthening/ROM      AM-PAC OT 6 Clicks Daily Activity     Outcome Measure Help from another person eating meals?: A Lot Help from another person taking care of personal grooming?: A Lot Help from another person toileting, which includes using toliet, bedpan, or urinal?: Total Help from another person bathing (including washing, rinsing, drying)?: Total Help from another person to put on and taking off regular upper body clothing?: A Lot Help from another person to put on and taking off regular lower body clothing?: Total 6 Click Score: 9   End of Session Equipment Utilized During Treatment: Other (comment) Laurent)  Activity Tolerance: Patient limited by lethargy;Patient limited by fatigue Patient left: in bed;with call bell/phone within reach;with bed alarm set;with SCD's  reapplied;Other (comment) (all 4 rails up d/t lethargy and cognition)  OT Visit Diagnosis: Unsteadiness on feet (R26.81);Other abnormalities of gait and mobility (R26.89);Muscle weakness (generalized) (M62.81);Cognitive communication deficit (R41.841);Other symptoms and signs involving cognitive function;Other symptoms and signs involving the nervous system (R29.898)                Time: 8974-8953 OT Time Calculation (min): 21 min Charges:  OT General Charges $OT Visit: 1 Visit OT Evaluation $OT Eval Moderate Complexity: 1 Mod  Lamarr Hoots, OT Acute  Rehabilitation Services Office: [phone]   Hoots Lamarr DEL 05/15/2024, 11:05 AM

## 2024-05-15 NOTE — TOC Progression Note (Signed)
 Transition of Care [facility]) - Progression Note    Patient Details  Name: Jordan [name] MRN: [id number] Date of Birth: 23-Oct-1941  Transition of Care [facility]) CM/SW Contact  Luise JAYSON Pan, [place] Phone Number: 05/15/2024, 3:05 PM  Clinical Narrative:   Per MD, patient can return to [name] tomorrow. CSW notified facility.   TOC will continue to follow.    Expected Discharge Plan:  (TBD) Barriers to Discharge: Continued Medical Work up  Expected Discharge Plan and Services In-house Referral: Clinical Social Work     Living arrangements for the past 2 months: Skilled Nursing Facility                                       Social Determinants of Health (SDOH) Interventions SDOH Screenings   Food Insecurity: No Food Insecurity (05/13/2024)  Housing: Low Risk  (05/13/2024)  Transportation Needs: No Transportation Needs (05/13/2024)  Utilities: Not At Risk (05/13/2024)  Depression (PHQ2-9): Low Risk  (04/12/2023)  Social Connections: Unknown (05/13/2024)  Tobacco Use: Medium Risk (05/13/2024)    Readmission Risk Interventions     No data to display

## 2024-05-15 NOTE — Progress Notes (Signed)
 PROGRESS NOTE    Jordan [name]  FMW:[id number] DOB: December 16, 1940 DOA: 05/12/2024 PCP: [name], MD    Brief Narrative:  83 year old with Parkinson's disease complicated by dementia and hallucinations, history of hypertension, metastatic neuroendocrine tumor who was sent from rehab due to altered mental status and was difficult to arouse.  In the emergency room he was found to be febrile, met septic criteria.  He was found to have left ureteral stone and inflammatory changes.  Abnormal urine.  Admitted on IV antibiotics and urology consultation.  He underwent stent placement and Foley catheterization by urology.  Subjective: Patient was seen and examined.  PT and OT were attempting to mobilize him, it was very difficult to mobilize him.  Poor historian.  Patient tells me that I am not going great.  Mostly improved.  Tmax 100 over last 24 hours.  Catheter with clear urine. Assessment & Plan:   Complicated UTI secondary to infected and obstructive uropathy from left ureteral stone Klebsiella bacteremia Severe sepsis present on admission, resolved now.  Status post left ureteric stent placement.  Tmax of 100 last night.  Fever curve improving.  Leukocytosis improving.  Sepsis physiology improving. Continue Rocephin  today. Will treat with cefadroxil  1000 mg twice daily for additional 3 days to complete total 7 days of therapy. Remains on Flomax .   Will remove Foley catheter today for voiding trial as per surgical plan.  Metabolic encephalopathy in the context of underlying Parkinson's disease and dementia with history of hallucinations and narcolepsy: Patient is on multiple symptom control medications including Seroquel , melatonin, modafinil , Exelon  patch and Sinemet .  Continued.  Paroxysmal A-fib: Rate controlled.  Therapeutic on Xarelto .  Type 2 diabetes: On SSI.  At home on Januvia and metformin .   DVT prophylaxis:  rivaroxaban  (XARELTO ) tablet 20 mg   Code Status: Full  code Family Communication: None at the bedside Disposition Plan: Status is: Inpatient Remains inpatient appropriate because: On IV antibiotics.  Anticipate back to nursing home tomorrow.     Consultants:  Urology  Procedures:  Ureteric stent  Antimicrobials:  Rocephin  7/11---     Objective: Vitals:   05/14/24 1947 05/15/24 0051 05/15/24 0442 05/15/24 0735  BP: (!) 154/73 (!) 153/96 (!) 176/84 (!) 156/80  Pulse: 67 78 75 71  Resp: (!) 22 19 20 17   Temp: 100.2 F (37.9 C) 98.5 F (36.9 C) 99.5 F (37.5 C) 97.8 F (36.6 C)  TempSrc: Axillary Axillary Axillary Oral  SpO2: 93% 94% 94% 94%  Weight:   88.9 kg   Height:        Intake/Output Summary (Last 24 hours) at 05/15/2024 0817 Last data filed at 05/15/2024 9380 Gross per 24 hour  Intake 1554.12 ml  Output 1250 ml  Net 304.12 ml   Filed Weights   05/13/24 1204 05/14/24 0355 05/15/24 0442  Weight: 87.1 kg 88.6 kg 88.9 kg    Examination:  General: Frail and debilitated. Cardiovascular: S1-S2 normal.  Regular rate rhythm. Respiratory: Bilateral clear.  No added sounds. Gastrointestinal: Soft.  Nontender.  Bowel sound present. Ext: Extreme weakness.  No deformities. Neuro: Alert and awake.  Not oriented.  Gross generalized weakness. Foley catheter with clear urine.     Data Reviewed: I have personally reviewed following labs and imaging studies  CBC: Recent Labs  Lab 05/12/24 1949 05/12/24 1958 05/13/24 0437 05/14/24 0330  WBC 13.6*  --  15.2* 7.8  NEUTROABS 11.9*  --   --   --   HGB 14.5 14.3 12.2*  13.0  HCT 44.8 42.0 37.3* 40.0  MCV 86.8  --  87.1 85.5  PLT 215  --  185 141*   Basic Metabolic Panel: Recent Labs  Lab 05/12/24 1949 05/12/24 1958 05/13/24 0437 05/14/24 0330  NA 137 136 135 136  K 4.8 4.6 3.8 3.9  CL 102  --  104 101  CO2 22  --  21* 24  GLUCOSE 150*  --  138* 162*  BUN 22  --  21 18  CREATININE 1.24  --  1.01 1.00  CALCIUM  9.9  --  9.0 9.1  MG  --   --  1.4* 1.7    GFR: Estimated Creatinine Clearance: 59.6 mL/min (by C-G formula based on SCr of 1 mg/dL). Liver Function Tests: Recent Labs  Lab 05/12/24 1949  AST 20  ALT 6  ALKPHOS 94  BILITOT 1.3*  PROT 7.5  ALBUMIN 3.6   No results for input(s): LIPASE, AMYLASE in the last 168 hours. No results for input(s): AMMONIA in the last 168 hours. Coagulation Profile: No results for input(s): INR, PROTIME in the last 168 hours. Cardiac Enzymes: No results for input(s): CKTOTAL, CKMB, CKMBINDEX, TROPONINI in the last 168 hours. BNP (last 3 results) No results for input(s): PROBNP in the last 8760 hours. HbA1C: No results for input(s): HGBA1C in the last 72 hours. CBG: Recent Labs  Lab 05/14/24 0617 05/14/24 1105 05/14/24 1531 05/14/24 2106 05/15/24 0602  GLUCAP 133* 171* 151* 178* 139*   Lipid Profile: No results for input(s): CHOL, HDL, LDLCALC, TRIG, CHOLHDL, LDLDIRECT in the last 72 hours. Thyroid  Function Tests: No results for input(s): TSH, T4TOTAL, FREET4, T3FREE, THYROIDAB in the last 72 hours. Anemia Panel: No results for input(s): VITAMINB12, FOLATE, FERRITIN, TIBC, IRON , RETICCTPCT in the last 72 hours. Sepsis Labs: Recent Labs  Lab 05/12/24 1958  LATICACIDVEN 1.9    Recent Results (from the past 240 hours)  Resp panel by RT-PCR (RSV, Flu A&B, Covid) Anterior Nasal Swab     Status: None   Collection Time: 05/12/24  7:49 PM   Specimen: Anterior Nasal Swab  Result Value Ref Range Status   SARS Coronavirus 2 by RT PCR NEGATIVE NEGATIVE Final   Influenza A by PCR NEGATIVE NEGATIVE Final   Influenza B by PCR NEGATIVE NEGATIVE Final    Comment: (NOTE) The Xpert Xpress SARS-CoV-2/FLU/RSV plus assay is intended as an aid in the diagnosis of influenza from Nasopharyngeal swab specimens and should not be used as a sole basis for treatment. Nasal washings and aspirates are unacceptable for Xpert Xpress  SARS-CoV-2/FLU/RSV testing.  Fact Sheet for Patients: BloggerCourse.com  Fact Sheet for Healthcare Providers: SeriousBroker.it  This test is not yet approved or cleared by the [place]  FDA and has been authorized for detection and/or diagnosis of SARS-CoV-2 by FDA under an Emergency Use Authorization (EUA). This EUA will remain in effect (meaning this test can be used) for the duration of the COVID-19 declaration under Section 564(b)(1) of the Act, 21 U.S.C. section 360bbb-3(b)(1), unless the authorization is terminated or revoked.     Resp Syncytial Virus by PCR NEGATIVE NEGATIVE Final    Comment: (NOTE) Fact Sheet for Patients: BloggerCourse.com  Fact Sheet for Healthcare Providers: SeriousBroker.it  This test is not yet approved or cleared by the [place]  FDA and has been authorized for detection and/or diagnosis of SARS-CoV-2 by FDA under an Emergency Use Authorization (EUA). This EUA will remain in effect (meaning this test can be used) for the duration of  the COVID-19 declaration under Section 564(b)(1) of the Act, 21 U.S.C. section 360bbb-3(b)(1), unless the authorization is terminated or revoked.  Performed at [facility], [address] [place], [place] [address]   Blood Culture (routine x 2)     Status: Abnormal   Collection Time: 05/12/24  8:07 PM   Specimen: BLOOD  Result Value Ref Range Status   Specimen Description BLOOD LEFT ANTECUBITAL  Final   Special Requests   Final    BOTTLES DRAWN AEROBIC AND ANAEROBIC Blood Culture adequate volume   Culture  Setup Time   Final    GRAM NEGATIVE RODS IN BOTH AEROBIC AND ANAEROBIC BOTTLES CRITICAL RESULT CALLED TO, READ BACK BY AND VERIFIED WITH: PHARMD [name] M. [id number] AT 1155, ADC Performed at [facility], [address] [place], [place] [address]    Culture KLEBSIELLA PNEUMONIAE (A)  Final    Report Status 05/15/2024 FINAL  Final   Organism ID, Bacteria KLEBSIELLA PNEUMONIAE  Final   Organism ID, Bacteria KLEBSIELLA PNEUMONIAE  Final      Susceptibility   Klebsiella pneumoniae - KIRBY BAUER*    CEFAZOLIN  SENSITIVE Sensitive    Klebsiella pneumoniae - MIC*    AMPICILLIN >=32 RESISTANT Resistant     CEFEPIME  <=0.12 SENSITIVE Sensitive     CEFTAZIDIME <=1 SENSITIVE Sensitive     CEFTRIAXONE  <=0.25 SENSITIVE Sensitive     CIPROFLOXACIN <=0.25 SENSITIVE Sensitive     GENTAMICIN  <=1 SENSITIVE Sensitive     IMIPENEM <=0.25 SENSITIVE Sensitive     TRIMETH/SULFA <=20 SENSITIVE Sensitive     AMPICILLIN/SULBACTAM 8 SENSITIVE Sensitive     PIP/TAZO <=4 SENSITIVE Sensitive ug/mL    * KLEBSIELLA PNEUMONIAE    KLEBSIELLA PNEUMONIAE  Blood Culture ID Panel (Reflexed)     Status: Abnormal   Collection Time: 05/12/24  8:07 PM  Result Value Ref Range Status   Enterococcus faecalis NOT DETECTED NOT DETECTED Final   Enterococcus Faecium NOT DETECTED NOT DETECTED Final   Listeria monocytogenes NOT DETECTED NOT DETECTED Final   Staphylococcus species NOT DETECTED NOT DETECTED Final   Staphylococcus aureus (BCID) NOT DETECTED NOT DETECTED Final   Staphylococcus epidermidis NOT DETECTED NOT DETECTED Final   Staphylococcus lugdunensis NOT DETECTED NOT DETECTED Final   Streptococcus species NOT DETECTED NOT DETECTED Final   Streptococcus agalactiae NOT DETECTED NOT DETECTED Final   Streptococcus pneumoniae NOT DETECTED NOT DETECTED Final   Streptococcus pyogenes NOT DETECTED NOT DETECTED Final   A.calcoaceticus-baumannii NOT DETECTED NOT DETECTED Final   Bacteroides fragilis NOT DETECTED NOT DETECTED Final   Enterobacterales DETECTED (A) NOT DETECTED Final    Comment: Enterobacterales represent a large order of gram negative bacteria, not a single organism. CRITICAL RESULT CALLED TO, READ BACK BY AND VERIFIED WITH: PHARMD [name] M. [id number] AT 1155, ADC    Enterobacter cloacae complex NOT  DETECTED NOT DETECTED Final   Escherichia coli NOT DETECTED NOT DETECTED Final   Klebsiella aerogenes NOT DETECTED NOT DETECTED Final   Klebsiella oxytoca NOT DETECTED NOT DETECTED Final   Klebsiella pneumoniae DETECTED (A) NOT DETECTED Final    Comment: CRITICAL RESULT CALLED TO, READ BACK BY AND VERIFIED WITH: PHARMD [name] M. [id number] AT 1155, ADC    Proteus species NOT DETECTED NOT DETECTED Final   Salmonella species NOT DETECTED NOT DETECTED Final   Serratia marcescens NOT DETECTED NOT DETECTED Final   Haemophilus influenzae NOT DETECTED NOT DETECTED Final   Neisseria meningitidis NOT DETECTED NOT DETECTED Final   Pseudomonas  aeruginosa NOT DETECTED NOT DETECTED Final   Stenotrophomonas maltophilia NOT DETECTED NOT DETECTED Final   Candida albicans NOT DETECTED NOT DETECTED Final   Candida auris NOT DETECTED NOT DETECTED Final   Candida glabrata NOT DETECTED NOT DETECTED Final   Candida krusei NOT DETECTED NOT DETECTED Final   Candida parapsilosis NOT DETECTED NOT DETECTED Final   Candida tropicalis NOT DETECTED NOT DETECTED Final   Cryptococcus neoformans/gattii NOT DETECTED NOT DETECTED Final   CTX-M ESBL NOT DETECTED NOT DETECTED Final   Carbapenem resistance IMP NOT DETECTED NOT DETECTED Final   Carbapenem resistance KPC NOT DETECTED NOT DETECTED Final   Carbapenem resistance NDM NOT DETECTED NOT DETECTED Final   Carbapenem resist OXA 48 LIKE NOT DETECTED NOT DETECTED Final   Carbapenem resistance VIM NOT DETECTED NOT DETECTED Final    Comment: Performed at [facility], [address], [place], [place] [address]  Urine Culture     Status: Abnormal (Preliminary result)   Collection Time: 05/12/24  9:36 PM   Specimen: Urine, Random  Result Value Ref Range Status   Specimen Description URINE, RANDOM  Final   Special Requests NONE Reflexed from [id number]  Final   Culture (A)  Final    60,000 COLONIES/mL KLEBSIELLA PNEUMONIAE SUSCEPTIBILITIES TO FOLLOW Performed at [facility], [address] [place], [place] [address]    Report Status PENDING  Incomplete  Blood Culture (routine x 2)     Status: None (Preliminary result)   Collection Time: 05/13/24  4:39 AM   Specimen: BLOOD RIGHT HAND  Result Value Ref Range Status   Specimen Description BLOOD RIGHT HAND  Final   Special Requests   Final    BOTTLES DRAWN AEROBIC AND ANAEROBIC Blood Culture adequate volume   Culture   Final    NO GROWTH 2 DAYS Performed at [facility], [address], [place], [place] [address]    Report Status PENDING  Incomplete         Radiology Studies: DG C-Arm 1-60 Min Result Date: 05/13/2024 CLINICAL DATA:  83 year old male undergoing ureteral stent insertion. EXAM: DG C-ARM 1-60 MIN CONTRAST:  Not specified. FLUOROSCOPY: Fluoroscopy Time:  0 minutes and 5 seconds Radiation Exposure Index (if provided by the fluoroscopic device): 1.9 mGy Number of Acquired Spot Images: 0 COMPARISON:  CT Chest, Abdomen, and Pelvis yesterday. FINDINGS: Single fluoroscopic image of the left abdomen with partially visible tip of cardiac pacemaker lead and L2 vertebral body augmentation. Pigtail catheter projects to the left of the spine. Bulky left nephrolithiasis including in the left renal pelvis yesterday less well visualized. IMPRESSION: Single fluoroscopic image of left abdominal pigtail catheter compatible with proximal portion of double-J left ureteral stent. Electronically Signed   By: VEAR Hurst M.D.   On: 05/13/2024 17:04        Scheduled Meds:  atorvastatin   20 mg Oral QHS   Carbidopa -Levodopa  ER  1 tablet Oral TID WC   Chlorhexidine  Gluconate Cloth  6 each Topical Daily   feeding supplement  237 mL Oral TID BM   insulin  aspart  0-6 Units Subcutaneous TID WC   melatonin  10 mg Oral QHS   modafinil   100 mg Oral Q breakfast   multivitamin with minerals  1 tablet Oral Daily   QUEtiapine   25 mg Oral QHS   rivaroxaban   20 mg Oral Q supper   rivastigmine   9.5 mg  Transdermal Daily   senna  1 tablet Oral Daily  tamsulosin   0.4 mg Oral QHS   Continuous Infusions:  cefTRIAXone  (ROCEPHIN )  IV 2 g (05/14/24 0955)   lactated ringers  100 mL/hr at 05/15/24 0435     LOS: 3 days    Time spent: 52 minutes    Renato Applebaum, MD [facility]

## 2024-05-15 NOTE — Evaluation (Signed)
 Physical Therapy Evaluation Patient Details Name: Jordan [name] MRN: [id number] DOB: 10/07/1941 Today's Date: 05/15/2024  History of Present Illness  83 yo male adm 05/12/24 from [facility] with AMS, hypoxia, sepsis, UTI.   7/13 cytoscopy with Lt ureteral stent. PMHx: Parkinsons, HLD, OSA, DM, HTN, chronic diastolic HF, CVA, falls, L2 compression fx, Lt hip fx, dementia  Clinical Impression  Pt with flat affect, decreased attention and awareness, delayed command following, generalized weakness and requires +2 assist for all mobility. Pt at SNF PTA and assume LTC at this time given last note from home was in April. Family not present to confirm PLOF or plan. Will attempt trial of acute therapy to progress function and activity tolerance with plan for return to LTC.   SPO2 94% on RA        If plan is discharge home, recommend the following: Two people to help with walking and/or transfers;Two people to help with bathing/dressing/bathroom;Direct supervision/assist for medications management;Assistance with feeding;Assistance with cooking/housework;Direct supervision/assist for financial management;Supervision due to cognitive status;Assist for transportation   Can travel by private vehicle   No    Equipment Recommendations Wheelchair cushion (measurements PT);Wheelchair (measurements PT);Hoyer lift;Hospital bed  Recommendations for Other Services       Functional Status Assessment Patient has had a recent decline in their functional status and/or demonstrates limited ability to make significant improvements in function in a reasonable and predictable amount of time     Precautions / Restrictions Precautions Precautions: Fall      Mobility  Bed Mobility Overal bed mobility: Needs Assistance Bed Mobility: Supine to Sit, Sit to Supine     Supine to sit: Mod assist, +2 for physical assistance Sit to supine: Mod assist, +2 for physical assistance   General bed mobility comments:  multimodal cues. physical assist for all aspects to transition to and from supine    Transfers Overall transfer level: Needs assistance   Transfers: Sit to/from Stand Sit to Stand: Max assist, +2 physical assistance, From elevated surface           General transfer comment: Multimodal cueing and physical assist for all aspects. +2 physical assist. Pt maintains trunk and cervical flexion position. Able to stand just enough for quick placement of Stedy wing seats. stood from bed then to stedy. total standing trials x 3 without complete trunk or hip extension throughout and max multimodal cues. moved up to [facility] in stedy, continued trunk fatigue with increased time upright Transfer via Lift Equipment: Stedy  Ambulation/Gait               General Gait Details: unable  Stairs            Wheelchair Mobility     Tilt Bed    Modified Rankin (Stroke Patients Only)       Balance Overall balance assessment: Needs assistance Sitting-balance support: Feet supported, Bilateral upper extremity supported Sitting balance-Leahy Scale: Poor Sitting balance - Comments: initial mod Assist with progression to CGA, posterior and lateral lean Postural control: Posterior lean, Left lateral lean   Standing balance-Leahy Scale: Zero Standing balance comment: Stedy and +2 assist                             Pertinent Vitals/Pain Pain Assessment Pain Assessment: PAINAD Breathing: normal Negative Vocalization: none Facial Expression: smiling or inexpressive Body Language: relaxed Consolability: no need to console PAINAD Score: 0    Home Living Family/patient  expects to be discharged to:: Skilled nursing facility                   Additional Comments: info from last admission, pt unable to give any history or PLOF    Prior Function Prior Level of Function : Needs assist  Cognitive Assist : Mobility (cognitive);ADLs (cognitive)           Mobility Comments:  unclear baseline with mobility, pt unable to provide history and no family present ADLs Comments: needs assist with ADLs at baseline per chart review     Extremity/Trunk Assessment   Upper Extremity Assessment Upper Extremity Assessment: Generalized weakness    Lower Extremity Assessment Lower Extremity Assessment: Generalized weakness    Cervical / Trunk Assessment Cervical / Trunk Assessment: Kyphotic  Communication   Communication Communication: Impaired Factors Affecting Communication: Hearing impaired;Difficulty expressing self    Cognition Arousal: Lethargic Behavior During Therapy: Flat affect   PT - Cognitive impairments: No family/caregiver present to determine baseline                       PT - Cognition Comments: baseline cognitive deficits without family present to determine change, needs max cues to attend to task or cues, maintains eyes shut without direct cues to open them Following commands: Impaired Following commands impaired: Follows one step commands with increased time     Cueing Cueing Techniques: Verbal cues, Gestural cues, Tactile cues, Visual cues     General Comments General comments (skin integrity, edema, etc.): eyes closed throughout session. Able to open eyes with max cueing. Able to respond and interact with eyes closed.    Exercises     Assessment/Plan    PT Assessment Patient needs continued PT services  PT Problem List Decreased strength;Decreased coordination;Decreased range of motion;Decreased activity tolerance;Decreased balance;Decreased mobility       PT Treatment Interventions Balance training;DME instruction;Functional mobility training;Therapeutic activities;Therapeutic exercise;Patient/family education;Cognitive remediation    PT Goals (Current goals can be found in the Care Plan section)  Acute Rehab PT Goals PT Goal Formulation: Patient unable to participate in goal setting Time For Goal Achievement:  05/29/24 Potential to Achieve Goals: Fair    Frequency Min 1X/week     Co-evaluation PT/OT/SLP Co-Evaluation/Treatment: Yes Reason for Co-Treatment: For patient/therapist safety;Complexity of the patient's impairments (multi-system involvement);Necessary to address cognition/behavior during functional activity PT goals addressed during session: Mobility/safety with mobility;Proper use of DME OT goals addressed during session: ADL's and self-care;Strengthening/ROM       AM-PAC PT 6 Clicks Mobility  Outcome Measure Help needed turning from your back to your side while in a flat bed without using bedrails?: Total Help needed moving from lying on your back to sitting on the side of a flat bed without using bedrails?: Total Help needed moving to and from a bed to a chair (including a wheelchair)?: Total Help needed standing up from a chair using your arms (e.g., wheelchair or bedside chair)?: Total Help needed to walk in hospital room?: Total Help needed climbing 3-5 steps with a railing? : Total 6 Click Score: 6    End of Session Equipment Utilized During Treatment: Gait belt Activity Tolerance: Patient tolerated treatment well Patient left: in bed;with call bell/phone within reach;with bed alarm set Nurse Communication: Mobility status;Need for lift equipment PT Visit Diagnosis: Other abnormalities of gait and mobility (R26.89);Difficulty in walking, not elsewhere classified (R26.2);Muscle weakness (generalized) (M62.81)    Time: 8974-8952 PT Time Calculation (min) (ACUTE ONLY): 22  min   Charges:   PT Evaluation $PT Eval Moderate Complexity: 1 Mod   PT General Charges $$ ACUTE PT VISIT: 1 Visit         Lenoard SQUIBB, PT Acute Rehabilitation Services Office: [phone]   Lenoard NOVAK Vinay Ertl 05/15/2024, 1:01 PM

## 2024-05-16 DIAGNOSIS — A419 Sepsis, unspecified organism: Secondary | ICD-10-CM | POA: Diagnosis not present

## 2024-05-16 DIAGNOSIS — R6520 Severe sepsis without septic shock: Secondary | ICD-10-CM | POA: Diagnosis not present

## 2024-05-16 LAB — COMPREHENSIVE METABOLIC PANEL WITH GFR
ALT: 32 U/L (ref 0–44)
AST: 39 U/L (ref 15–41)
Albumin: 2.2 g/dL — ABNORMAL LOW (ref 3.5–5.0)
Alkaline Phosphatase: 146 U/L — ABNORMAL HIGH (ref 38–126)
Anion gap: 11 (ref 5–15)
BUN: 13 mg/dL (ref 8–23)
CO2: 26 mmol/L (ref 22–32)
Calcium: 8.8 mg/dL — ABNORMAL LOW (ref 8.9–10.3)
Chloride: 101 mmol/L (ref 98–111)
Creatinine, Ser: 0.87 mg/dL (ref 0.61–1.24)
GFR, Estimated: >60 mL/min (ref >60)
Glucose, Bld: 252 mg/dL — ABNORMAL HIGH (ref 70–99)
Potassium: 3.2 mmol/L — ABNORMAL LOW (ref 3.5–5.1)
Sodium: 138 mmol/L (ref 135–145)
Total Bilirubin: 0.5 mg/dL (ref 0.0–1.2)
Total Protein: 5.8 g/dL — ABNORMAL LOW (ref 6.5–8.1)

## 2024-05-16 LAB — GLUCOSE, CAPILLARY
Glucose-Capillary: 154 mg/dL — ABNORMAL HIGH (ref 70–99)
Glucose-Capillary: 255 mg/dL — ABNORMAL HIGH (ref 70–99)
Glucose-Capillary: 207 mg/dL — ABNORMAL HIGH (ref 70–99)
Glucose-Capillary: 201 mg/dL — ABNORMAL HIGH (ref 70–99)

## 2024-05-16 LAB — MAGNESIUM: Magnesium: 1.3 mg/dL — ABNORMAL LOW (ref 1.7–2.4)

## 2024-05-16 LAB — PHOSPHORUS: Phosphorus: 2.4 mg/dL — ABNORMAL LOW (ref 2.5–4.6)

## 2024-05-16 MED ORDER — CEFADROXIL 500 MG PO CAPS: 1000.0000 mg | ORAL_CAPSULE | Freq: Two times a day (BID) | ORAL | Status: DC

## 2024-05-16 MED ORDER — POTASSIUM CHLORIDE CRYS ER 20 MEQ PO TBCR
40.0000 meq | EXTENDED_RELEASE_TABLET | Freq: Two times a day (BID) | ORAL | Status: DC
  Administered 2024-05-16 (×2): 40 meq via ORAL
  Filled 2024-05-16 (×2): qty 2

## 2024-05-16 MED ORDER — POTASSIUM & SODIUM PHOSPHATES 280-160-250 MG PO PACK
1.0000 | PACK | Freq: Three times a day (TID) | ORAL | Status: AC
  Administered 2024-05-16 (×2): 1 via ORAL
  Filled 2024-05-16 (×3): qty 1

## 2024-05-16 MED ORDER — MAGNESIUM SULFATE 4 GM/100ML IV SOLN
4.0000 g | Freq: Once | INTRAVENOUS | Status: AC
  Administered 2024-05-16: 4 g via INTRAVENOUS
  Filled 2024-05-16: qty 100

## 2024-05-16 MED ORDER — TAMSULOSIN HCL 0.4 MG PO CAPS: 0.4000 mg | ORAL_CAPSULE | Freq: Every day | ORAL | Status: AC

## 2024-05-16 MED ORDER — MAGNESIUM OXIDE -MG SUPPLEMENT 400 (240 MG) MG PO TABS
400.0000 mg | ORAL_TABLET | Freq: Two times a day (BID) | ORAL | Status: DC
  Administered 2024-05-16 (×2): 400 mg via ORAL
  Filled 2024-05-16 (×2): qty 1

## 2024-05-16 NOTE — Progress Notes (Signed)
 Patient last documented bowel movement was prior to admission, offered PRN Miralax  and Sorbitol , however patient's wife requested to give the medicines for constipation tomorrow to provide proper sleeping pattern for the patient.

## 2024-05-16 NOTE — Progress Notes (Deleted)
 Patient last documented bowel movement was prior to admission, offered PRN Miralax  and Sorbitol , however patient's wife requested to give the medicines for constipation tomorrow to provided proper sleeping pattern for the patient.

## 2024-05-16 NOTE — Progress Notes (Signed)
 PROGRESS NOTE    Jordan [name]  FMW:[id number] DOB: [birth year] DOA: 05/12/2024 PCP: [name], MD    Brief Narrative:  83 year old with Parkinson's disease complicated by dementia and hallucinations, history of hypertension, metastatic neuroendocrine tumor who was sent from rehab due to altered mental status and was difficult to arouse.  In the emergency room he was found to be febrile, met septic criteria.  He was found to have left ureteral stone and inflammatory changes.  Abnormal urine.  Admitted on IV antibiotics and urology consultation.  He underwent stent placement and Foley catheterization by urology.  Subjective: Patient seen and examined.  Denies any complaints.  He is not sure what to tell me.  Lays in the bed, eyes closed.  Nursing reported no overnight events.  Voiding trial was successful.  There is clear urine in the canister. Discussed with patient's wife, repeating electrolytes and replacing.  Anticipate sending back to nursing home tomorrow if remains stable.  Assessment & Plan:   Complicated UTI secondary to infected and obstructive uropathy from left ureteral stone Klebsiella bacteremia Severe sepsis present on admission, resolved now.  Status post left ureteric stent placement. Fever curve improving.  Afebrile last 24 hours.  Leukocytosis improving.  Sepsis physiology improving. Treated with Rocephin ,Will treat with cefadroxil  1000 mg twice daily for total of 7 days.  Keep on Flomax .    Metabolic encephalopathy in the context of underlying Parkinson's disease and dementia with history of hallucinations and narcolepsy: Patient is on multiple symptom control medications including Seroquel , melatonin, modafinil , Exelon  patch and Sinemet .  Continued.  Paroxysmal A-fib: Has a pacemaker.  Rate controlled.  Therapeutic on Xarelto .  Type 2 diabetes: On SSI.  At home on Januvia and metformin .   DVT prophylaxis:  rivaroxaban  (XARELTO ) tablet 20 mg   Code  Status: Full code Family Communication: Wife on the phone. Disposition Plan: Status is: Inpatient Remains inpatient appropriate because: Stabilizing.  Needs monitoring today.  Anticipate discharge tomorrow.     Consultants:  Urology  Procedures:  Ureteric stent  Antimicrobials:  Rocephin  7/11--- 7/15 Cefadroxil  7/16---     Objective: Vitals:   05/15/24 1955 05/16/24 0015 05/16/24 0410 05/16/24 0740  BP: 137/80 (!) 140/81 (!) 158/85 (!) 148/79  Pulse: 62 89 85 74  Resp: 18 20 19  (!) 45  Temp:  99.5 F (37.5 C) 98 F (36.7 C) 97.8 F (36.6 C)  TempSrc:  Oral Axillary Oral  SpO2: 93% 93% 96% 95%  Weight:   88.5 kg   Height:        Intake/Output Summary (Last 24 hours) at 05/16/2024 1133 Last data filed at 05/16/2024 0831 Gross per 24 hour  Intake 674.1 ml  Output 1220 ml  Net -545.9 ml   Filed Weights   05/14/24 0355 05/15/24 0442 05/16/24 0410  Weight: 88.6 kg 88.9 kg 88.5 kg    Examination:  General: Chronically sick looking.  Frail and debilitated gentleman.  Not in any distress.  On room air. Cardiovascular: S1-S2 normal.  Regular rate rhythm. Respiratory: Bilateral clear.  No added sounds. Gastrointestinal: Soft.  Nontender.  Bowel sound present. Ext: Extreme weakness.  No deformities. Neuro: Alert and awake.  Oriented to himself and family.  Not very interactive.  Gross generalized weakness. External catheter with clear urine.     Data Reviewed: I have personally reviewed following labs and imaging studies  CBC: Recent Labs  Lab 05/12/24 1949 05/12/24 1958 05/13/24 0437 05/14/24 0330  WBC 13.6*  --  15.2* 7.8  NEUTROABS 11.9*  --   --   --   HGB 14.5 14.3 12.2* 13.0  HCT 44.8 42.0 37.3* 40.0  MCV 86.8  --  87.1 85.5  PLT 215  --  185 141*   Basic Metabolic Panel: Recent Labs  Lab 05/12/24 1949 05/12/24 1958 05/13/24 0437 05/14/24 0330  NA 137 136 135 136  K 4.8 4.6 3.8 3.9  CL 102  --  104 101  CO2 22  --  21* 24  GLUCOSE 150*   --  138* 162*  BUN 22  --  21 18  CREATININE 1.24  --  1.01 1.00  CALCIUM  9.9  --  9.0 9.1  MG  --   --  1.4* 1.7   GFR: Estimated Creatinine Clearance: 59.4 mL/min (by C-G formula based on SCr of 1 mg/dL). Liver Function Tests: Recent Labs  Lab 05/12/24 1949  AST 20  ALT 6  ALKPHOS 94  BILITOT 1.3*  PROT 7.5  ALBUMIN 3.6   No results for input(s): LIPASE, AMYLASE in the last 168 hours. No results for input(s): AMMONIA in the last 168 hours. Coagulation Profile: No results for input(s): INR, PROTIME in the last 168 hours. Cardiac Enzymes: No results for input(s): CKTOTAL, CKMB, CKMBINDEX, TROPONINI in the last 168 hours. BNP (last 3 results) No results for input(s): PROBNP in the last 8760 hours. HbA1C: No results for input(s): HGBA1C in the last 72 hours. CBG: Recent Labs  Lab 05/15/24 0602 05/15/24 1126 05/15/24 1558 05/15/24 2101 05/16/24 0602  GLUCAP 139* 147* 240* 167* 154*   Lipid Profile: No results for input(s): CHOL, HDL, LDLCALC, TRIG, CHOLHDL, LDLDIRECT in the last 72 hours. Thyroid  Function Tests: No results for input(s): TSH, T4TOTAL, FREET4, T3FREE, THYROIDAB in the last 72 hours. Anemia Panel: No results for input(s): VITAMINB12, FOLATE, FERRITIN, TIBC, IRON , RETICCTPCT in the last 72 hours. Sepsis Labs: Recent Labs  Lab 05/12/24 1958  LATICACIDVEN 1.9    Recent Results (from the past 240 hours)  Resp panel by RT-PCR (RSV, Flu A&B, Covid) Anterior Nasal Swab     Status: None   Collection Time: 05/12/24  7:49 PM   Specimen: Anterior Nasal Swab  Result Value Ref Range Status   SARS Coronavirus 2 by RT PCR NEGATIVE NEGATIVE Final   Influenza A by PCR NEGATIVE NEGATIVE Final   Influenza B by PCR NEGATIVE NEGATIVE Final    Comment: (NOTE) The Xpert Xpress SARS-CoV-2/FLU/RSV plus assay is intended as an aid in the diagnosis of influenza from Nasopharyngeal swab specimens and should not be  used as a sole basis for treatment. Nasal washings and aspirates are unacceptable for Xpert Xpress SARS-CoV-2/FLU/RSV testing.  Fact Sheet for Patients: BloggerCourse.com  Fact Sheet for Healthcare Providers: SeriousBroker.it  This test is not yet approved or cleared by the [place]  FDA and has been authorized for detection and/or diagnosis of SARS-CoV-2 by FDA under an Emergency Use Authorization (EUA). This EUA will remain in effect (meaning this test can be used) for the duration of the COVID-19 declaration under Section 564(b)(1) of the Act, 21 U.S.C. section 360bbb-3(b)(1), unless the authorization is terminated or revoked.     Resp Syncytial Virus by PCR NEGATIVE NEGATIVE Final    Comment: (NOTE) Fact Sheet for Patients: BloggerCourse.com  Fact Sheet for Healthcare Providers: SeriousBroker.it  This test is not yet approved or cleared by the [place]  FDA and has been authorized for detection and/or diagnosis of SARS-CoV-2 by FDA under an Emergency Use Authorization (EUA).  This EUA will remain in effect (meaning this test can be used) for the duration of the COVID-19 declaration under Section 564(b)(1) of the Act, 21 U.S.C. section 360bbb-3(b)(1), unless the authorization is terminated or revoked.  Performed at [facility], [address] [place], [place] [address]   Blood Culture (routine x 2)     Status: Abnormal   Collection Time: 05/12/24  8:07 PM   Specimen: BLOOD  Result Value Ref Range Status   Specimen Description BLOOD LEFT ANTECUBITAL  Final   Special Requests   Final    BOTTLES DRAWN AEROBIC AND ANAEROBIC Blood Culture adequate volume   Culture  Setup Time   Final    GRAM NEGATIVE RODS IN BOTH AEROBIC AND ANAEROBIC BOTTLES CRITICAL RESULT CALLED TO, READ BACK BY AND VERIFIED WITH: PHARMD [name] M. [id number] AT 1155, ADC Performed at [facility], [address] [place], [place] [address]    Culture KLEBSIELLA PNEUMONIAE (A)  Final   Report Status 05/15/2024 FINAL  Final   Organism ID, Bacteria KLEBSIELLA PNEUMONIAE  Final   Organism ID, Bacteria KLEBSIELLA PNEUMONIAE  Final      Susceptibility   Klebsiella pneumoniae - KIRBY BAUER*    CEFAZOLIN  SENSITIVE Sensitive    Klebsiella pneumoniae - MIC*    AMPICILLIN >=32 RESISTANT Resistant     CEFEPIME  <=0.12 SENSITIVE Sensitive     CEFTAZIDIME <=1 SENSITIVE Sensitive     CEFTRIAXONE  <=0.25 SENSITIVE Sensitive     CIPROFLOXACIN <=0.25 SENSITIVE Sensitive     GENTAMICIN  <=1 SENSITIVE Sensitive     IMIPENEM <=0.25 SENSITIVE Sensitive     TRIMETH/SULFA <=20 SENSITIVE Sensitive     AMPICILLIN/SULBACTAM 8 SENSITIVE Sensitive     PIP/TAZO <=4 SENSITIVE Sensitive ug/mL    * KLEBSIELLA PNEUMONIAE    KLEBSIELLA PNEUMONIAE  Blood Culture ID Panel (Reflexed)     Status: Abnormal   Collection Time: 05/12/24  8:07 PM  Result Value Ref Range Status   Enterococcus faecalis NOT DETECTED NOT DETECTED Final   Enterococcus Faecium NOT DETECTED NOT DETECTED Final   Listeria monocytogenes NOT DETECTED NOT DETECTED Final   Staphylococcus species NOT DETECTED NOT DETECTED Final   Staphylococcus aureus (BCID) NOT DETECTED NOT DETECTED Final   Staphylococcus epidermidis NOT DETECTED NOT DETECTED Final   Staphylococcus lugdunensis NOT DETECTED NOT DETECTED Final   Streptococcus species NOT DETECTED NOT DETECTED Final   Streptococcus agalactiae NOT DETECTED NOT DETECTED Final   Streptococcus pneumoniae NOT DETECTED NOT DETECTED Final   Streptococcus pyogenes NOT DETECTED NOT DETECTED Final   A.calcoaceticus-baumannii NOT DETECTED NOT DETECTED Final   Bacteroides fragilis NOT DETECTED NOT DETECTED Final   Enterobacterales DETECTED (A) NOT DETECTED Final    Comment: Enterobacterales represent a large order of gram negative bacteria, not a single organism. CRITICAL RESULT CALLED TO, READ  BACK BY AND VERIFIED WITH: PHARMD [name] M. [id number] AT 1155, ADC    Enterobacter cloacae complex NOT DETECTED NOT DETECTED Final   Escherichia coli NOT DETECTED NOT DETECTED Final   Klebsiella aerogenes NOT DETECTED NOT DETECTED Final   Klebsiella oxytoca NOT DETECTED NOT DETECTED Final   Klebsiella pneumoniae DETECTED (A) NOT DETECTED Final    Comment: CRITICAL RESULT CALLED TO, READ BACK BY AND VERIFIED WITH: PHARMD [name] M. [id number] AT 1155, ADC    Proteus species NOT DETECTED NOT DETECTED Final   Salmonella species NOT DETECTED NOT DETECTED Final   Serratia marcescens NOT DETECTED NOT DETECTED Final   Haemophilus influenzae NOT  DETECTED NOT DETECTED Final   Neisseria meningitidis NOT DETECTED NOT DETECTED Final   Pseudomonas aeruginosa NOT DETECTED NOT DETECTED Final   Stenotrophomonas maltophilia NOT DETECTED NOT DETECTED Final   Candida albicans NOT DETECTED NOT DETECTED Final   Candida auris NOT DETECTED NOT DETECTED Final   Candida glabrata NOT DETECTED NOT DETECTED Final   Candida krusei NOT DETECTED NOT DETECTED Final   Candida parapsilosis NOT DETECTED NOT DETECTED Final   Candida tropicalis NOT DETECTED NOT DETECTED Final   Cryptococcus neoformans/gattii NOT DETECTED NOT DETECTED Final   CTX-M ESBL NOT DETECTED NOT DETECTED Final   Carbapenem resistance IMP NOT DETECTED NOT DETECTED Final   Carbapenem resistance KPC NOT DETECTED NOT DETECTED Final   Carbapenem resistance NDM NOT DETECTED NOT DETECTED Final   Carbapenem resist OXA 48 LIKE NOT DETECTED NOT DETECTED Final   Carbapenem resistance VIM NOT DETECTED NOT DETECTED Final    Comment: Performed at [facility], [address] [place], [place] [address]  Urine Culture     Status: Abnormal   Collection Time: 05/12/24  9:36 PM   Specimen: Urine, Random  Result Value Ref Range Status   Specimen Description URINE, RANDOM  Final   Special Requests NONE Reflexed from [id number]  Final   Culture (A)  Final    60,000  COLONIES/mL KLEBSIELLA PNEUMONIAE Two isolates with different morphologies were identified as the same organism.The most resistant organism was reported. Performed at [facility], [address], [place], [place] [address]    Report Status 05/15/2024 FINAL  Final   Organism ID, Bacteria KLEBSIELLA PNEUMONIAE (A)  Final      Susceptibility   Klebsiella pneumoniae - MIC*    AMPICILLIN >=32 RESISTANT Resistant     CEFAZOLIN  <=4 SENSITIVE Sensitive     CEFEPIME  <=0.12 SENSITIVE Sensitive     CEFTRIAXONE  <=0.25 SENSITIVE Sensitive     CIPROFLOXACIN <=0.25 SENSITIVE Sensitive     GENTAMICIN  <=1 SENSITIVE Sensitive     IMIPENEM 0.5 SENSITIVE Sensitive     NITROFURANTOIN 128 RESISTANT Resistant     TRIMETH/SULFA <=20 SENSITIVE Sensitive     AMPICILLIN/SULBACTAM 8 SENSITIVE Sensitive     PIP/TAZO <=4 SENSITIVE Sensitive ug/mL    * 60,000 COLONIES/mL KLEBSIELLA PNEUMONIAE  Blood Culture (routine x 2)     Status: None (Preliminary result)   Collection Time: 05/13/24  4:39 AM   Specimen: BLOOD RIGHT HAND  Result Value Ref Range Status   Specimen Description BLOOD RIGHT HAND  Final   Special Requests   Final    BOTTLES DRAWN AEROBIC AND ANAEROBIC Blood Culture adequate volume   Culture   Final    NO GROWTH 3 DAYS Performed at [facility], [address], [place], [place] [address]    Report Status PENDING  Incomplete         Radiology Studies: No results found.       Scheduled Meds:  atorvastatin   20 mg Oral QHS   Carbidopa -Levodopa  ER  1 tablet Oral TID WC   cefadroxil   1,000 mg Oral BID   Chlorhexidine  Gluconate Cloth  6 each Topical Daily   feeding supplement  237 mL Oral TID BM   insulin  aspart  0-6 Units Subcutaneous TID WC   magnesium  oxide  400 mg Oral BID   melatonin  10 mg Oral QHS   modafinil   100 mg Oral Q breakfast   multivitamin with minerals  1 tablet Oral Daily   QUEtiapine   25 mg Oral QHS   rivaroxaban   20 mg Oral Q supper   rivastigmine    9.5 mg Transdermal Daily   senna  1 tablet Oral Daily   tamsulosin   0.4 mg Oral QHS   Continuous Infusions:     LOS: 4 days    Time spent: 52 minutes    Renato Applebaum, MD [facility]

## 2024-05-16 NOTE — Plan of Care (Signed)
  Problem: Tissue Perfusion: Goal: Adequacy of tissue perfusion will improve Outcome: Progressing   Problem: Clinical Measurements: Goal: Cardiovascular complication will be avoided Outcome: Progressing   Problem: Elimination: Goal: Will not experience complications related to urinary retention Outcome: Progressing   Problem: Safety: Goal: Ability to remain free from injury will improve Outcome: Progressing

## 2024-05-16 NOTE — TOC Progression Note (Addendum)
 Transition of Care [facility]) - Progression Note    Patient Details  Name: Jordan [name] MRN: [id number] Date of Birth: 26-Oct-1941  [facility]) CM/SW Contact  Luise JAYSON Pan, [place] Phone Number: 05/16/2024, 10:24 AM  Clinical Narrative:   CSW spoke with Burnard about patients DC to [name]. Burnard stated she wants to speak with MD as she feels his discharge is premature. CSW notified MD.   10:49 AM Per MD, patients DC cancelled per spouses request for additional labs to be conducted. MD stated Burnard inquired about looking into other facilities. CSW spoke with Burnard and informed her that patients [employer] insurance is covering STR at [name] at this time. PT at the hospital has recommended LTC and insurance does not cover LTC at [facility]. Burnard expressed her understanding and is okay with patient discharging to [name] once more medically stable. Facility notified about canceled discharge for today.   TOC will continue to follow.    Expected Discharge Plan: Skilled Nursing Facility Barriers to Discharge: Other (must enter comment) (Spouse wants to speak with MD regarding patients DC)  Expected Discharge Plan and Services In-house Referral: Clinical Social Work     Living arrangements for the past 2 months: Skilled Nursing Facility Expected Discharge Date: 05/16/24                                     Social Determinants of Health (SDOH) Interventions SDOH Screenings   Food Insecurity: No Food Insecurity (05/13/2024)  Housing: Low Risk  (05/13/2024)  Transportation Needs: No Transportation Needs (05/13/2024)  Utilities: Not At Risk (05/13/2024)  Depression (PHQ2-9): Low Risk  (04/12/2023)  Social Connections: Unknown (05/13/2024)  Tobacco Use: Medium Risk (05/13/2024)    Readmission Risk Interventions     No data to display

## 2024-05-16 NOTE — Progress Notes (Signed)
   05/16/24 2004  BiPAP/CPAP/SIPAP  BiPAP/CPAP/SIPAP Pt Type Adult (Pt wife stated she will place her husbands home unit on him tonight. RT will assist as needed.)  Mask Type Nasal pillows  Respiratory Rate 18 breaths/min  Patient Home Machine Yes  Patient Home Mask Yes  Patient Home Tubing Yes  BiPAP/CPAP /SiPAP Vitals  Resp (!) 26  MEWS Score/Color  MEWS Score 2  MEWS Score Color Yellow

## 2024-05-17 ENCOUNTER — Encounter: Payer: Self-pay | Admitting: Urology

## 2024-05-17 DIAGNOSIS — R6520 Severe sepsis without septic shock: Secondary | ICD-10-CM | POA: Diagnosis not present

## 2024-05-17 DIAGNOSIS — A419 Sepsis, unspecified organism: Secondary | ICD-10-CM | POA: Diagnosis not present

## 2024-05-17 LAB — BASIC METABOLIC PANEL WITH GFR
Anion gap: 11 (ref 5–15)
BUN: 13 mg/dL (ref 8–23)
CO2: 25 mmol/L (ref 22–32)
Calcium: 9.2 mg/dL (ref 8.9–10.3)
Chloride: 103 mmol/L (ref 98–111)
Creatinine, Ser: 0.81 mg/dL (ref 0.61–1.24)
GFR, Estimated: >60 mL/min (ref >60)
Glucose, Bld: 160 mg/dL — ABNORMAL HIGH (ref 70–99)
Potassium: 3.9 mmol/L (ref 3.5–5.1)
Sodium: 139 mmol/L (ref 135–145)

## 2024-05-17 LAB — CBC WITH DIFFERENTIAL/PLATELET
Abs Immature Granulocytes: 0.05 K/uL (ref 0.00–0.07)
Basophils Absolute: 0 K/uL (ref 0.0–0.1)
Basophils Relative: 1 %
Eosinophils Absolute: 0.2 K/uL (ref 0.0–0.5)
Eosinophils Relative: 3 %
HCT: 40.3 % (ref 39.0–52.0)
Hemoglobin: 13.4 g/dL (ref 13.0–17.0)
Immature Granulocytes: 1 %
Lymphocytes Relative: 23 %
Lymphs Abs: 1.7 K/uL (ref 0.7–4.0)
MCH: 28.3 pg (ref 26.0–34.0)
MCHC: 33.3 g/dL (ref 30.0–36.0)
MCV: 85 fL (ref 80.0–100.0)
Monocytes Absolute: 0.9 K/uL (ref 0.1–1.0)
Monocytes Relative: 13 %
Neutro Abs: 4.5 K/uL (ref 1.7–7.7)
Neutrophils Relative %: 59 %
Platelets: 185 K/uL (ref 150–400)
RBC: 4.74 MIL/uL (ref 4.22–5.81)
RDW: 15 % (ref 11.5–15.5)
WBC: 7.4 K/uL (ref 4.0–10.5)
nRBC: 0 % (ref 0.0–0.2)

## 2024-05-17 LAB — GLUCOSE, CAPILLARY
Glucose-Capillary: 158 mg/dL — ABNORMAL HIGH (ref 70–99)
Glucose-Capillary: 158 mg/dL — ABNORMAL HIGH (ref 70–99)

## 2024-05-17 LAB — MAGNESIUM: Magnesium: 1.9 mg/dL (ref 1.7–2.4)

## 2024-05-17 MED ORDER — MAGNESIUM OXIDE -MG SUPPLEMENT 400 (240 MG) MG PO TABS: 400.0000 mg | ORAL_TABLET | Freq: Two times a day (BID) | ORAL | Status: AC

## 2024-05-17 MED ORDER — CEFADROXIL 500 MG PO CAPS: 1000.0000 mg | ORAL_CAPSULE | Freq: Two times a day (BID) | ORAL | Status: AC

## 2024-05-17 MED ORDER — POTASSIUM CHLORIDE CRYS ER 10 MEQ PO TBCR: 10.0000 meq | EXTENDED_RELEASE_TABLET | Freq: Every day | ORAL | Status: DC

## 2024-05-17 NOTE — Discharge Summary (Addendum)
 Physician Discharge Summary  Jordan [name] FMW:[id number] DOB: 15-Nov-1940 DOA: 05/12/2024  PCP: [name], MD  Admit date: 05/12/2024 Discharge date: 05/17/2024  Admitted From: Nursing home Disposition: Nursing home  Recommendations for Outpatient Follow-up:  Follow up with PCP in 1-2 weeks Please obtain BMP/CBC/magnesium  in one week 3.  Urology to schedule follow-up.    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  In the emergency room he was found to be febrile, met septic criteria.  He was found to have left ureteral stone and inflammatory changes.  Abnormal urine.  Admitted on IV antibiotics and urology consultation.  He underwent stent placement and Foley catheterization by urology.  Clinically improving now.  Going back to skilled nursing facility today.   Assessment & Plan:   Complicated UTI secondary to infected and obstructive uropathy from left ureteral stone Klebsiella bacteremia Severe sepsis present on admission, resolved now.   Status post left ureteric stent placement.  Clinically improving.  Leukocytosis improving.  Sepsis physiology improving. Treated with Rocephin ,Will treat with cefadroxil  1000 mg twice daily for total of 7 days.  Keep on Flomax .  Successful voiding trial.   Metabolic encephalopathy in the context of underlying Parkinson's disease and dementia with history of hallucinations and narcolepsy: Patient is on multiple symptom control medications including Seroquel , melatonin, modafinil , Exelon  patch and Sinemet .  Continued.   Paroxysmal A-fib: Has a pacemaker.  Rate controlled.  Therapeutic on Xarelto .  Hypokalemia/hypomagnesemia: Persistently low levels.  Replaced  aggressively.  Will keep on a scheduled magnesium  replacement.  Will need recheck in 1 week.   Type 2 diabetes: On SSI.  At home on Januvia and metformin .  Resume.  Sleep apnea: Uses CPAP at home setting.  Stable to discharge to a skilled nursing rehab.   Discharge Diagnoses:  Principal Problem:   Severe sepsis (HCC) Active Problems:   Physical deconditioning   Paroxysmal atrial fibrillation (HCC)   Type 2 diabetes mellitus with hyperlipidemia (HCC)   Narcolepsy without cataplexy   Hallucinations, visual   REM behavioral disorder   Parkinson disease (HCC)   OSA on CPAP   Sepsis secondary to UTI (HCC)   Bacteremia due to Klebsiella pneumoniae    Discharge Instructions  Discharge Instructions     Diet - low sodium heart healthy   Complete by: As directed    Discharge wound care:   Complete by: As directed    Protect pressure points   Increase activity slowly   Complete by: As directed    Increase activity slowly   Complete by: As directed    No wound care   Complete by: As directed       Allergies as of 05/17/2024       Reactions   Amantadines Other (See Comments)   Mental changes   Clonazepam  Other (See Comments)   Mental changes   Lisinopril  Other (See Comments)   Unknown reaction   Zoloft  [sertraline ] Other (See Comments)   Mental changes        Medication List     STOP taking these medications    dextroamphetamine  10 MG tablet Commonly known as: DEXTROSTAT        TAKE these medications    acetaminophen  500 MG tablet Commonly known as: TYLENOL  Take 1,000 mg by mouth every  6 (six) hours as needed for moderate pain (pain score 4-6) or mild pain (pain score 1-3).   atorvastatin  20 MG tablet Commonly known as: LIPITOR Take 20 mg by mouth at bedtime.   b complex vitamins capsule Take 1 capsule by mouth at bedtime.   carbidopa -levodopa  50-200 MG tablet Commonly known as: SINEMET  CR Take 1 tablet by mouth at bedtime. What changed: Another  medication with the same name was changed. Make sure you understand how and when to take each.   Carbidopa -Levodopa  ER 25-100 MG tablet controlled release Commonly known as: SINEMET  CR Take 1 tablet by mouth 3 (three) times daily with meals. What changed: additional instructions   cefadroxil  500 MG capsule Commonly known as: DURICEF Take 2 capsules (1,000 mg total) by mouth 2 (two) times daily for 2 days.   Cholecalciferol  1.25 MG (50000 UT) capsule Take 50,000 Units by mouth once a week. Take one capsule by  mouth every Tuesday.   clotrimazole-betamethasone cream Commonly known as: LOTRISONE Apply 1 Application topically daily as needed (Rash).   feeding supplement (GLUCERNA SHAKE) Liqd Take 237 mLs by mouth 2 (two) times daily between meals. Alternate with ensure Diabetic   HM SALONPAS PAIN RELIEF EX Apply 1 patch topically daily as needed (Back pain).   insulin  lispro 100 UNIT/ML injection Commonly known as: HUMALOG Inject 0-10 Units into the skin 4 (four) times daily -  before meals and at bedtime. Sliding scale as follows;    0-200=   0 units 201-250= 2 units 251-300= 4 units 301-350= 6 units 351-400= 8 units 400= 10 units PUSH FLUIDS  and repeat CBG  in 2 hours. IF reading is still >400, notify doctor.   Januvia 100 MG tablet Generic drug: sitaGLIPtin Take 100 mg by mouth daily.   lidocaine  5 % Commonly known as: LIDODERM  Place 1 patch onto the skin daily as needed. Purchase over the counter. On for 12 hours and off for 12 hours   magnesium  oxide 400 (240 Mg) MG tablet Commonly known as: MAG-OX Take 1 tablet (400 mg total) by mouth 2 (two) times daily.   Melatonin 5 MG Caps Take 5 mg by mouth at bedtime.   metFORMIN  1000 MG tablet Commonly known as: GLUCOPHAGE  Take 1,000 mg by mouth 2 (two) times daily.   midodrine  2.5 MG tablet Commonly known as: PROAMATINE  Take 2.5 mg by mouth daily as needed (Cardiovascular support). Take one tablet by mouth daily as  needed for SBP <120   modafinil  100 MG tablet Commonly known as: PROVIGIL  Take 100 mg by mouth daily.   Nitrostat  0.4 MG SL tablet Generic drug: nitroGLYCERIN  PLACE 1 TABLET (0.4 MG TOTAL) UNDER THE TONGUE EVERY 5 (FIVE) MINUTES AS NEEDED FOR CHEST PAIN.   polyethylene glycol 17 g packet Commonly known as: MIRALAX  / GLYCOLAX  Take 17 g by mouth 2 (two) times daily. What changed:  when to take this reasons to take this   potassium chloride  10 MEQ tablet Commonly known as: KLOR-CON  M Take 1 tablet (10 mEq total) by mouth daily.   QUEtiapine  25 MG tablet Commonly known as: SEROQUEL  Take 25 mg by mouth at bedtime.   QUEtiapine  50 MG tablet Commonly known as: SEROQUEL  Take 50 mg by mouth at bedtime. Start taking on: May 19, 2024   rivaroxaban  20 MG Tabs tablet Commonly known as: Xarelto  Take 1 tablet (20 mg total) by mouth daily.   rivastigmine  9.5 mg/24hr Commonly known as: EXELON  Place 9.5 mg onto the skin daily.  senna 8.6 MG Tabs tablet Commonly known as: SENOKOT Take 1 tablet (8.6 mg total) by mouth daily.   tamsulosin  0.4 MG Caps capsule Commonly known as: FLOMAX  Take 1 capsule (0.4 mg total) by mouth at bedtime.   traZODone  50 MG tablet Commonly known as: DESYREL  Take 50 mg by mouth at bedtime as needed for sleep. As needed for insomnia  Give one additional tablet every 12 hrs as needed  for sleep cycle               Discharge Care Instructions  (From admission, onward)           Start     Ordered   05/16/24 0000  Discharge wound care:       Comments: Protect pressure points   05/16/24 0916            Contact information for after-discharge care     Destination     [name] .   Service: Skilled Nursing Contact information: [address] [place]  [address] [phone]                    Allergies  Allergen Reactions   Amantadines Other (See Comments)    Mental changes   Clonazepam  Other (See  Comments)    Mental changes   Lisinopril  Other (See Comments)    Unknown reaction   Zoloft  [Sertraline ] Other (See Comments)    Mental changes    Consultations: Urology   Procedures/Studies: DG C-Arm 1-60 Min Result Date: 05/13/2024 CLINICAL DATA:  83 year old male undergoing ureteral stent insertion. EXAM: DG C-ARM 1-60 MIN CONTRAST:  Not specified. FLUOROSCOPY: Fluoroscopy Time:  0 minutes and 5 seconds Radiation Exposure Index (if provided by the fluoroscopic device): 1.9 mGy Number of Acquired Spot Images: 0 COMPARISON:  CT Chest, Abdomen, and Pelvis yesterday. FINDINGS: Single fluoroscopic image of the left abdomen with partially visible tip of cardiac pacemaker lead and L2 vertebral body augmentation. Pigtail catheter projects to the left of the spine. Bulky left nephrolithiasis including in the left renal pelvis yesterday less well visualized. IMPRESSION: Single fluoroscopic image of left abdominal pigtail catheter compatible with proximal portion of double-J left ureteral stent. Electronically Signed   By: VEAR Hurst M.D.   On: 05/13/2024 17:04   CT CHEST ABDOMEN PELVIS W CONTRAST Result Date: 05/12/2024 CLINICAL DATA:  Fever and cough, possible sepsis EXAM: CT CHEST, ABDOMEN, AND PELVIS WITH CONTRAST TECHNIQUE: Multidetector CT imaging of the chest, abdomen and pelvis was performed following the standard protocol during bolus administration of intravenous contrast. RADIATION DOSE REDUCTION: This exam was performed according to the departmental dose-optimization program which includes automated exposure control, adjustment of the mA and/or kV according to patient size and/or use of iterative reconstruction technique. CONTRAST:  75mL OMNIPAQUE  IOHEXOL  350 MG/ML SOLN COMPARISON:  Chest x-ray from earlier in the same day, CT from 02/02/2024. FINDINGS: CT CHEST FINDINGS Cardiovascular: Atherosclerotic calcifications of the thoracic aorta are noted without aneurysmal dilatation or dissection. Pacing  device is again seen. No cardiac enlargement is noted. Heavy coronary calcifications are seen. No large central pulmonary embolus is noted. Mediastinum/Nodes: Thoracic inlet is within normal limits. No hilar or mediastinal adenopathy is noted. The esophagus as visualized is within normal limits. Lungs/Pleura: The lungs are well aerated bilaterally. No focal infiltrate or effusion is noted. Minimal left basilar atelectasis is seen. Few scattered calcified granulomas are noted. Musculoskeletal: No chest wall mass or suspicious bone lesions identified. CT ABDOMEN PELVIS FINDINGS Hepatobiliary: Hypodense  lesion with calcification is noted in the posteromedial aspect of the right lobe of the liver. This is stable from the prior exam small cyst is noted on image number 52 of series 3. This is stable from the prior exam. The lesion in the dome of the liver is not as well appreciated on today's exam. Pancreas: Unremarkable. No pancreatic ductal dilatation or surrounding inflammatory changes. Previously seen cystic lesion in the head is not well appreciated today. Spleen: Normal in size without focal abnormality. Adrenals/Urinary Tract: Adrenal glands are within normal limits. Kidneys are well visualized bilaterally. Normal excretion is noted. Simple cysts are seen bilaterally. No follow-up is recommended. Left renal pelvic stone is noted measuring up to 11 mm. This likely causes some intermittent obstruction at the UPJ. Inflammatory changes surrounding the renal pelvis are noted. The distal left ureter demonstrates 2 small stones measuring 1-2 mm. These are stable in appearance from the prior exam. The bladder is within normal limits. Stomach/Bowel: Scattered diverticular change of the colon is noted without evidence of diverticulitis. The appendix is within normal limits. Small bowel and stomach are within normal limits. Vascular/Lymphatic: Aortic atherosclerosis. No enlarged abdominal or pelvic lymph nodes. Reproductive:  Prostate is unremarkable. Other: Central mesenteric soft tissue lesion is again seen and stable with diffuse calcifications within. Musculoskeletal: Postsurgical changes in the left hip are seen. Degenerative changes of lumbar spine are noted. Changes of prior vertebral augmentation are noted at L2. IMPRESSION: CT of the chest: Mild left basilar atelectasis. CT of the abdomen and pelvis: Persistent left renal pelvic stone as well as distal left ureteral stones causing mild obstructive change as well as inflammatory changes in the ureteral wall which may be related to underlying UTI. Stable partially calcified mesenteric lesion centrally best seen today on image number 78 of series 3. Subtle liver lesions are again seen. Resolution of previous cystic lesion in the pancreatic head Electronically Signed   By: Oneil Devonshire M.D.   On: 05/12/2024 23:04   DG Chest Port 1 View Result Date: 05/12/2024 CLINICAL DATA:  Possible sepsis EXAM: PORTABLE CHEST 1 VIEW COMPARISON:  02/25/2024 FINDINGS: Cardiac shadow is stable. Aortic calcifications are noted. Pacing device is again seen. The overall inspiratory effort is poor. Crowding of the vascular markings is seen although no focal infiltrate is noted. No bony abnormality is noted. IMPRESSION: No acute abnormality noted despite the poor inspiratory effort. Electronically Signed   By: Oneil Devonshire M.D.   On: 05/12/2024 19:54   (Echo, Carotid, EGD, Colonoscopy, ERCP)    Subjective: Patient seen in the morning rounds.  Sleeping with CPAP on.  Denies any complaints.  Voiding without difficulties.   Discharge Exam: Vitals:   05/17/24 0038 05/17/24 0550  BP: (!) 157/85 (!) 154/90  Pulse: 69 79  Resp: 20 20  Temp: 98.9 F (37.2 C) 97.6 F (36.4 C)  SpO2: 93% 96%   Vitals:   05/16/24 2004 05/16/24 2039 05/17/24 0038 05/17/24 0550  BP:  121/72 (!) 157/85 (!) 154/90  Pulse:  66 69 79  Resp: (!) 26 20 20 20   Temp:   98.9 F (37.2 C) 97.6 F (36.4 C)  TempSrc:    Oral Oral  SpO2:  93% 93% 96%  Weight:    92.8 kg  Height:        General: Pt is alert, awake, not in acute distress Frail and debilitated.  Age appropriate.  Looks comfortable.  Mostly alert.  Not very interested to talk. Cardiovascular: RRR, S1/S2 +,  no rubs, no gallops, left precordial pacemaker present. Respiratory: CTA bilaterally, no wheezing, no rhonchi Abdominal: Soft, NT, ND, bowel sounds + Extremities: no edema, no cyanosis    The results of significant diagnostics from this hospitalization (including imaging, microbiology, ancillary and laboratory) are listed below for reference.     Microbiology: Recent Results (from the past 240 hours)  Resp panel by RT-PCR (RSV, Flu A&B, Covid) Anterior Nasal Swab     Status: None   Collection Time: 05/12/24  7:49 PM   Specimen: Anterior Nasal Swab  Result Value Ref Range Status   SARS Coronavirus 2 by RT PCR NEGATIVE NEGATIVE Final   Influenza A by PCR NEGATIVE NEGATIVE Final   Influenza B by PCR NEGATIVE NEGATIVE Final    Comment: (NOTE) The Xpert Xpress SARS-CoV-2/FLU/RSV plus assay is intended as an aid in the diagnosis of influenza from Nasopharyngeal swab specimens and should not be used as a sole basis for treatment. Nasal washings and aspirates are unacceptable for Xpert Xpress SARS-CoV-2/FLU/RSV testing.  Fact Sheet for Patients: BloggerCourse.com  Fact Sheet for Healthcare Providers: SeriousBroker.it  This test is not yet approved or cleared by the [place]  FDA and has been authorized for detection and/or diagnosis of SARS-CoV-2 by FDA under an Emergency Use Authorization (EUA). This EUA will remain in effect (meaning this test can be used) for the duration of the COVID-19 declaration under Section 564(b)(1) of the Act, 21 U.S.C. section 360bbb-3(b)(1), unless the authorization is terminated or revoked.     Resp Syncytial Virus by PCR NEGATIVE NEGATIVE  Final    Comment: (NOTE) Fact Sheet for Patients: BloggerCourse.com  Fact Sheet for Healthcare Providers: SeriousBroker.it  This test is not yet approved or cleared by the [place]  FDA and has been authorized for detection and/or diagnosis of SARS-CoV-2 by FDA under an Emergency Use Authorization (EUA). This EUA will remain in effect (meaning this test can be used) for the duration of the COVID-19 declaration under Section 564(b)(1) of the Act, 21 U.S.C. section 360bbb-3(b)(1), unless the authorization is terminated or revoked.  Performed at [facility], [address] [place], [place] [address]   Blood Culture (routine x 2)     Status: Abnormal   Collection Time: 05/12/24  8:07 PM   Specimen: BLOOD  Result Value Ref Range Status   Specimen Description BLOOD LEFT ANTECUBITAL  Final   Special Requests   Final    BOTTLES DRAWN AEROBIC AND ANAEROBIC Blood Culture adequate volume   Culture  Setup Time   Final    GRAM NEGATIVE RODS IN BOTH AEROBIC AND ANAEROBIC BOTTLES CRITICAL RESULT CALLED TO, READ BACK BY AND VERIFIED WITH: PHARMD [name] M. [id number] AT 1155, ADC Performed at [facility], [address], [place], [place] [address]    Culture KLEBSIELLA PNEUMONIAE (A)  Final   Report Status 05/15/2024 FINAL  Final   Organism ID, Bacteria KLEBSIELLA PNEUMONIAE  Final   Organism ID, Bacteria KLEBSIELLA PNEUMONIAE  Final      Susceptibility   Klebsiella pneumoniae - KIRBY BAUER*    CEFAZOLIN  SENSITIVE Sensitive    Klebsiella pneumoniae - MIC*    AMPICILLIN >=32 RESISTANT Resistant     CEFEPIME  <=0.12 SENSITIVE Sensitive     CEFTAZIDIME <=1 SENSITIVE Sensitive     CEFTRIAXONE  <=0.25 SENSITIVE Sensitive     CIPROFLOXACIN <=0.25 SENSITIVE Sensitive     GENTAMICIN  <=1 SENSITIVE Sensitive     IMIPENEM <=0.25 SENSITIVE Sensitive     TRIMETH/SULFA <=20  SENSITIVE Sensitive     AMPICILLIN/SULBACTAM 8 SENSITIVE  Sensitive     PIP/TAZO <=4 SENSITIVE Sensitive ug/mL    * KLEBSIELLA PNEUMONIAE    KLEBSIELLA PNEUMONIAE  Blood Culture ID Panel (Reflexed)     Status: Abnormal   Collection Time: 05/12/24  8:07 PM  Result Value Ref Range Status   Enterococcus faecalis NOT DETECTED NOT DETECTED Final   Enterococcus Faecium NOT DETECTED NOT DETECTED Final   Listeria monocytogenes NOT DETECTED NOT DETECTED Final   Staphylococcus species NOT DETECTED NOT DETECTED Final   Staphylococcus aureus (BCID) NOT DETECTED NOT DETECTED Final   Staphylococcus epidermidis NOT DETECTED NOT DETECTED Final   Staphylococcus lugdunensis NOT DETECTED NOT DETECTED Final   Streptococcus species NOT DETECTED NOT DETECTED Final   Streptococcus agalactiae NOT DETECTED NOT DETECTED Final   Streptococcus pneumoniae NOT DETECTED NOT DETECTED Final   Streptococcus pyogenes NOT DETECTED NOT DETECTED Final   A.calcoaceticus-baumannii NOT DETECTED NOT DETECTED Final   Bacteroides fragilis NOT DETECTED NOT DETECTED Final   Enterobacterales DETECTED (A) NOT DETECTED Final    Comment: Enterobacterales represent a large order of gram negative bacteria, not a single organism. CRITICAL RESULT CALLED TO, READ BACK BY AND VERIFIED WITH: PHARMD [name] M. [id number] AT 1155, ADC    Enterobacter cloacae complex NOT DETECTED NOT DETECTED Final   Escherichia coli NOT DETECTED NOT DETECTED Final   Klebsiella aerogenes NOT DETECTED NOT DETECTED Final   Klebsiella oxytoca NOT DETECTED NOT DETECTED Final   Klebsiella pneumoniae DETECTED (A) NOT DETECTED Final    Comment: CRITICAL RESULT CALLED TO, READ BACK BY AND VERIFIED WITH: PHARMD [name] M. [id number] AT 1155, ADC    Proteus species NOT DETECTED NOT DETECTED Final   Salmonella species NOT DETECTED NOT DETECTED Final   Serratia marcescens NOT DETECTED NOT DETECTED Final   Haemophilus influenzae NOT DETECTED NOT DETECTED Final   Neisseria meningitidis NOT DETECTED NOT DETECTED Final   Pseudomonas  aeruginosa NOT DETECTED NOT DETECTED Final   Stenotrophomonas maltophilia NOT DETECTED NOT DETECTED Final   Candida albicans NOT DETECTED NOT DETECTED Final   Candida auris NOT DETECTED NOT DETECTED Final   Candida glabrata NOT DETECTED NOT DETECTED Final   Candida krusei NOT DETECTED NOT DETECTED Final   Candida parapsilosis NOT DETECTED NOT DETECTED Final   Candida tropicalis NOT DETECTED NOT DETECTED Final   Cryptococcus neoformans/gattii NOT DETECTED NOT DETECTED Final   CTX-M ESBL NOT DETECTED NOT DETECTED Final   Carbapenem resistance IMP NOT DETECTED NOT DETECTED Final   Carbapenem resistance KPC NOT DETECTED NOT DETECTED Final   Carbapenem resistance NDM NOT DETECTED NOT DETECTED Final   Carbapenem resist OXA 48 LIKE NOT DETECTED NOT DETECTED Final   Carbapenem resistance VIM NOT DETECTED NOT DETECTED Final    Comment: Performed at [facility], [address] [place], [place] [address]  Urine Culture     Status: Abnormal   Collection Time: 05/12/24  9:36 PM   Specimen: Urine, Random  Result Value Ref Range Status   Specimen Description URINE, RANDOM  Final   Special Requests NONE Reflexed from [id number]  Final   Culture (A)  Final    60,000 COLONIES/mL KLEBSIELLA PNEUMONIAE Two isolates with different morphologies were identified as the same organism.The most resistant organism was reported. Performed at [facility], [address] [place], [place] [address]    Report Status 05/15/2024 FINAL  Final   Organism ID, Bacteria KLEBSIELLA PNEUMONIAE (A)  Final  Susceptibility   Klebsiella pneumoniae - MIC*    AMPICILLIN >=32 RESISTANT Resistant     CEFAZOLIN  <=4 SENSITIVE Sensitive     CEFEPIME  <=0.12 SENSITIVE Sensitive     CEFTRIAXONE  <=0.25 SENSITIVE Sensitive     CIPROFLOXACIN <=0.25 SENSITIVE Sensitive     GENTAMICIN  <=1 SENSITIVE Sensitive     IMIPENEM 0.5 SENSITIVE Sensitive     NITROFURANTOIN 128 RESISTANT Resistant     TRIMETH/SULFA <=20 SENSITIVE  Sensitive     AMPICILLIN/SULBACTAM 8 SENSITIVE Sensitive     PIP/TAZO <=4 SENSITIVE Sensitive ug/mL    * 60,000 COLONIES/mL KLEBSIELLA PNEUMONIAE  Blood Culture (routine x 2)     Status: None (Preliminary result)   Collection Time: 05/13/24  4:39 AM   Specimen: BLOOD RIGHT HAND  Result Value Ref Range Status   Specimen Description BLOOD RIGHT HAND  Final   Special Requests   Final    BOTTLES DRAWN AEROBIC AND ANAEROBIC Blood Culture adequate volume   Culture   Final    NO GROWTH 4 DAYS Performed at [facility], [address], [place], [place] [address]    Report Status PENDING  Incomplete     Labs: BNP (last 3 results) No results for input(s): BNP in the last 8760 hours. Basic Metabolic Panel: Recent Labs  Lab 05/12/24 1949 05/12/24 1958 05/13/24 0437 05/14/24 0330 05/16/24 1052 05/17/24 0900  NA 137 136 135 136 138 139  K 4.8 4.6 3.8 3.9 3.2* 3.9  CL 102  --  104 101 101 103  CO2 22  --  21* 24 26 25   GLUCOSE 150*  --  138* 162* 252* 160*  BUN 22  --  21 18 13 13   CREATININE 1.24  --  1.01 1.00 0.87 0.81  CALCIUM  9.9  --  9.0 9.1 8.8* 9.2  MG  --   --  1.4* 1.7 1.3* 1.9  PHOS  --   --   --   --  2.4*  --    Liver Function Tests: Recent Labs  Lab 05/12/24 1949 05/16/24 1052  AST 20 39  ALT 6 32  ALKPHOS 94 146*  BILITOT 1.3* 0.5  PROT 7.5 5.8*  ALBUMIN 3.6 2.2*   No results for input(s): LIPASE, AMYLASE in the last 168 hours. No results for input(s): AMMONIA in the last 168 hours. CBC: Recent Labs  Lab 05/12/24 1949 05/12/24 1958 05/13/24 0437 05/14/24 0330 05/17/24 0900  WBC 13.6*  --  15.2* 7.8 7.4  NEUTROABS 11.9*  --   --   --  4.5  HGB 14.5 14.3 12.2* 13.0 13.4  HCT 44.8 42.0 37.3* 40.0 40.3  MCV 86.8  --  87.1 85.5 85.0  PLT 215  --  185 141* 185   Cardiac Enzymes: No results for input(s): CKTOTAL, CKMB, CKMBINDEX, TROPONINI in the last 168 hours. BNP: Invalid input(s): POCBNP CBG: Recent Labs  Lab  05/16/24 0602 05/16/24 1120 05/16/24 1617 05/16/24 2109 05/17/24 0617  GLUCAP 154* 255* 207* 201* 158*   D-Dimer No results for input(s): DDIMER in the last 72 hours. Hgb A1c No results for input(s): HGBA1C in the last 72 hours. Lipid Profile No results for input(s): CHOL, HDL, LDLCALC, TRIG, CHOLHDL, LDLDIRECT in the last 72 hours. Thyroid  function studies No results for input(s): TSH, T4TOTAL, T3FREE, THYROIDAB in the last 72 hours.  Invalid input(s): FREET3 Anemia work up No results for input(s): VITAMINB12, FOLATE, FERRITIN, TIBC, IRON , RETICCTPCT in the last 72 hours. Urinalysis  Component Value Date/Time   COLORURINE AMBER (A) 05/12/2024 2136   APPEARANCEUR CLOUDY (A) 05/12/2024 2136   LABSPEC 1.019 05/12/2024 2136   PHURINE 5.0 05/12/2024 2136   GLUCOSEU NEGATIVE 05/12/2024 2136   HGBUR LARGE (A) 05/12/2024 2136   BILIRUBINUR NEGATIVE 05/12/2024 2136   KETONESUR 20 (A) 05/12/2024 2136   PROTEINUR 100 (A) 05/12/2024 2136   NITRITE POSITIVE (A) 05/12/2024 2136   LEUKOCYTESUR MODERATE (A) 05/12/2024 2136   Sepsis Labs Recent Labs  Lab 05/12/24 1949 05/13/24 0437 05/14/24 0330 05/17/24 0900  WBC 13.6* 15.2* 7.8 7.4   Microbiology Recent Results (from the past 240 hours)  Resp panel by RT-PCR (RSV, Flu A&B, Covid) Anterior Nasal Swab     Status: None   Collection Time: 05/12/24  7:49 PM   Specimen: Anterior Nasal Swab  Result Value Ref Range Status   SARS Coronavirus 2 by RT PCR NEGATIVE NEGATIVE Final   Influenza A by PCR NEGATIVE NEGATIVE Final   Influenza B by PCR NEGATIVE NEGATIVE Final    Comment: (NOTE) The Xpert Xpress SARS-CoV-2/FLU/RSV plus assay is intended as an aid in the diagnosis of influenza from Nasopharyngeal swab specimens and should not be used as a sole basis for treatment. Nasal washings and aspirates are unacceptable for Xpert Xpress SARS-CoV-2/FLU/RSV testing.  Fact Sheet for  Patients: BloggerCourse.com  Fact Sheet for Healthcare Providers: SeriousBroker.it  This test is not yet approved or cleared by the [place]  FDA and has been authorized for detection and/or diagnosis of SARS-CoV-2 by FDA under an Emergency Use Authorization (EUA). This EUA will remain in effect (meaning this test can be used) for the duration of the COVID-19 declaration under Section 564(b)(1) of the Act, 21 U.S.C. section 360bbb-3(b)(1), unless the authorization is terminated or revoked.     Resp Syncytial Virus by PCR NEGATIVE NEGATIVE Final    Comment: (NOTE) Fact Sheet for Patients: BloggerCourse.com  Fact Sheet for Healthcare Providers: SeriousBroker.it  This test is not yet approved or cleared by the [place]  FDA and has been authorized for detection and/or diagnosis of SARS-CoV-2 by FDA under an Emergency Use Authorization (EUA). This EUA will remain in effect (meaning this test can be used) for the duration of the COVID-19 declaration under Section 564(b)(1) of the Act, 21 U.S.C. section 360bbb-3(b)(1), unless the authorization is terminated or revoked.  Performed at [facility], [address], [place], [place] [address]   Blood Culture (routine x 2)     Status: Abnormal   Collection Time: 05/12/24  8:07 PM   Specimen: BLOOD  Result Value Ref Range Status   Specimen Description BLOOD LEFT ANTECUBITAL  Final   Special Requests   Final    BOTTLES DRAWN AEROBIC AND ANAEROBIC Blood Culture adequate volume   Culture  Setup Time   Final    GRAM NEGATIVE RODS IN BOTH AEROBIC AND ANAEROBIC BOTTLES CRITICAL RESULT CALLED TO, READ BACK BY AND VERIFIED WITH: PHARMD [name] M. [id number] AT 1155, ADC Performed at [facility], [address], [place], [place] [address]    Culture KLEBSIELLA PNEUMONIAE (A)  Final   Report Status 05/15/2024 FINAL  Final    Organism ID, Bacteria KLEBSIELLA PNEUMONIAE  Final   Organism ID, Bacteria KLEBSIELLA PNEUMONIAE  Final      Susceptibility   Klebsiella pneumoniae - KIRBY BAUER*    CEFAZOLIN  SENSITIVE Sensitive    Klebsiella pneumoniae - MIC*    AMPICILLIN >=32 RESISTANT Resistant     CEFEPIME  <=0.12  SENSITIVE Sensitive     CEFTAZIDIME <=1 SENSITIVE Sensitive     CEFTRIAXONE  <=0.25 SENSITIVE Sensitive     CIPROFLOXACIN <=0.25 SENSITIVE Sensitive     GENTAMICIN  <=1 SENSITIVE Sensitive     IMIPENEM <=0.25 SENSITIVE Sensitive     TRIMETH/SULFA <=20 SENSITIVE Sensitive     AMPICILLIN/SULBACTAM 8 SENSITIVE Sensitive     PIP/TAZO <=4 SENSITIVE Sensitive ug/mL    * KLEBSIELLA PNEUMONIAE    KLEBSIELLA PNEUMONIAE  Blood Culture ID Panel (Reflexed)     Status: Abnormal   Collection Time: 05/12/24  8:07 PM  Result Value Ref Range Status   Enterococcus faecalis NOT DETECTED NOT DETECTED Final   Enterococcus Faecium NOT DETECTED NOT DETECTED Final   Listeria monocytogenes NOT DETECTED NOT DETECTED Final   Staphylococcus species NOT DETECTED NOT DETECTED Final   Staphylococcus aureus (BCID) NOT DETECTED NOT DETECTED Final   Staphylococcus epidermidis NOT DETECTED NOT DETECTED Final   Staphylococcus lugdunensis NOT DETECTED NOT DETECTED Final   Streptococcus species NOT DETECTED NOT DETECTED Final   Streptococcus agalactiae NOT DETECTED NOT DETECTED Final   Streptococcus pneumoniae NOT DETECTED NOT DETECTED Final   Streptococcus pyogenes NOT DETECTED NOT DETECTED Final   A.calcoaceticus-baumannii NOT DETECTED NOT DETECTED Final   Bacteroides fragilis NOT DETECTED NOT DETECTED Final   Enterobacterales DETECTED (A) NOT DETECTED Final    Comment: Enterobacterales represent a large order of gram negative bacteria, not a single organism. CRITICAL RESULT CALLED TO, READ BACK BY AND VERIFIED WITH: PHARMD [name] M. [id number] AT 1155, ADC    Enterobacter cloacae complex NOT DETECTED NOT DETECTED Final   Escherichia  coli NOT DETECTED NOT DETECTED Final   Klebsiella aerogenes NOT DETECTED NOT DETECTED Final   Klebsiella oxytoca NOT DETECTED NOT DETECTED Final   Klebsiella pneumoniae DETECTED (A) NOT DETECTED Final    Comment: CRITICAL RESULT CALLED TO, READ BACK BY AND VERIFIED WITH: PHARMD [name] M. [id number] AT 1155, ADC    Proteus species NOT DETECTED NOT DETECTED Final   Salmonella species NOT DETECTED NOT DETECTED Final   Serratia marcescens NOT DETECTED NOT DETECTED Final   Haemophilus influenzae NOT DETECTED NOT DETECTED Final   Neisseria meningitidis NOT DETECTED NOT DETECTED Final   Pseudomonas aeruginosa NOT DETECTED NOT DETECTED Final   Stenotrophomonas maltophilia NOT DETECTED NOT DETECTED Final   Candida albicans NOT DETECTED NOT DETECTED Final   Candida auris NOT DETECTED NOT DETECTED Final   Candida glabrata NOT DETECTED NOT DETECTED Final   Candida krusei NOT DETECTED NOT DETECTED Final   Candida parapsilosis NOT DETECTED NOT DETECTED Final   Candida tropicalis NOT DETECTED NOT DETECTED Final   Cryptococcus neoformans/gattii NOT DETECTED NOT DETECTED Final   CTX-M ESBL NOT DETECTED NOT DETECTED Final   Carbapenem resistance IMP NOT DETECTED NOT DETECTED Final   Carbapenem resistance KPC NOT DETECTED NOT DETECTED Final   Carbapenem resistance NDM NOT DETECTED NOT DETECTED Final   Carbapenem resist OXA 48 LIKE NOT DETECTED NOT DETECTED Final   Carbapenem resistance VIM NOT DETECTED NOT DETECTED Final    Comment: Performed at [facility], [address] [place], [place] [address]  Urine Culture     Status: Abnormal   Collection Time: 05/12/24  9:36 PM   Specimen: Urine, Random  Result Value Ref Range Status   Specimen Description URINE, RANDOM  Final   Special Requests NONE Reflexed from [id number]  Final   Culture (A)  Final    60,000 COLONIES/mL KLEBSIELLA PNEUMONIAE Two isolates with different  morphologies were identified as the same organism.The most resistant organism was  reported. Performed at [facility], [address] [place], [place] [address]    Report Status 05/15/2024 FINAL  Final   Organism ID, Bacteria KLEBSIELLA PNEUMONIAE (A)  Final      Susceptibility   Klebsiella pneumoniae - MIC*    AMPICILLIN >=32 RESISTANT Resistant     CEFAZOLIN  <=4 SENSITIVE Sensitive     CEFEPIME  <=0.12 SENSITIVE Sensitive     CEFTRIAXONE  <=0.25 SENSITIVE Sensitive     CIPROFLOXACIN <=0.25 SENSITIVE Sensitive     GENTAMICIN  <=1 SENSITIVE Sensitive     IMIPENEM 0.5 SENSITIVE Sensitive     NITROFURANTOIN 128 RESISTANT Resistant     TRIMETH/SULFA <=20 SENSITIVE Sensitive     AMPICILLIN/SULBACTAM 8 SENSITIVE Sensitive     PIP/TAZO <=4 SENSITIVE Sensitive ug/mL    * 60,000 COLONIES/mL KLEBSIELLA PNEUMONIAE  Blood Culture (routine x 2)     Status: None (Preliminary result)   Collection Time: 05/13/24  4:39 AM   Specimen: BLOOD RIGHT HAND  Result Value Ref Range Status   Specimen Description BLOOD RIGHT HAND  Final   Special Requests   Final    BOTTLES DRAWN AEROBIC AND ANAEROBIC Blood Culture adequate volume   Culture   Final    NO GROWTH 4 DAYS Performed at [facility], [address] [place], [place] [address]    Report Status PENDING  Incomplete     Time coordinating discharge: 40 minutes  SIGNED:   Renato Applebaum, MD  [facility] 05/17/2024, 11:38 AM

## 2024-05-17 NOTE — TOC Progression Note (Signed)
 Transition of Care [facility] - Progression Note    Patient Details  Name: Jordan [name] MRN: [id number] Date of Birth: Feb 26, 1941  Transition of [facility]) CM/SW Contact  Luise JAYSON Pan, [place] Phone Number: 05/17/2024, 11:25 AM  Clinical Narrative:   CSW called Burnard to discuss plans to move forward with patients discharge. Burnard stated she does not want her husband to discharge until she talks with someone regarding patients medical status. Burnard wants to know:  if pt had morning lab work done, results of lab work, and if everything is still low she wants to know what the treatment plan is. Burnard would also like to know if patient has had a bowel movement. CSW notified MD and bedside RN of [facility] request to call her.   TOC will continue to follow.    Expected Discharge Plan: Skilled Nursing Facility Barriers to Discharge: Other (must enter comment) (Spouse wants to speak with MD regarding patients DC)  Expected Discharge Plan and Services In-house Referral: Clinical Social Work     Living arrangements for the past 2 months: Skilled Nursing Facility Expected Discharge Date: 05/17/24                                     Social Determinants of Health (SDOH) Interventions SDOH Screenings   Food Insecurity: No Food Insecurity (05/13/2024)  Housing: Low Risk  (05/13/2024)  Transportation Needs: No Transportation Needs (05/13/2024)  Utilities: Not At Risk (05/13/2024)  Depression (PHQ2-9): Low Risk  (04/12/2023)  Social Connections: Unknown (05/13/2024)  Tobacco Use: Medium Risk (05/13/2024)    Readmission Risk Interventions     No data to display

## 2024-05-17 NOTE — Discharge Planning (Signed)
 Called [phone] for report. The call was immediately forwarded to voicemail. Left a message for a return call.

## 2024-05-17 NOTE — Discharge Planning (Signed)
 Called [phone] was forwarded to [phone] for report. The call was immediately forwarded to voicemail.

## 2024-05-17 NOTE — TOC Transition Note (Signed)
 Transition of Care [facility]) - Discharge Note   Patient Details  Name: Jordan [name] MRN: [id number] Date of Birth: 1941-06-28  Transition of Care [facility]) CM/SW Contact:  Luise JAYSON Pan, LCSWA Phone Number: 05/17/2024, 11:56 AM   Clinical Narrative:   Patient will DC to: [name] Anticipated DC date: 05/17/24  Family notified: Burnard (spouse) Transport by: [facility]   Per MD patient ready for DC to [name]. RN to call report prior to discharge (call to report is [phone]; room 120). RN, patient, patient's family, and facility notified of DC. MD notified CSW that he spoke with spouse and answered her questions, good to move forward with discharge. Discharge Summary and FL2 sent to facility. DC packet on chart. Ambulance transport requested for patient 11:54AM.   CSW will sign off for now as social work intervention is no longer needed. Please consult us  again if new needs arise.      Final next level of care: Skilled Nursing Facility Barriers to Discharge: Barriers Resolved   Patient Goals and CMS Choice Patient states their goals for this hospitalization and ongoing recovery are:: To get better          Discharge Placement              Patient chooses bed at: [name] at [facility] Patient to be transferred to facility by: [facility] Name of family member notified: Burnard (spouse) Patient and family notified of of transfer: 05/17/24  Discharge Plan and Services Additional resources added to the After Visit Summary for   In-house Referral: Clinical Social Work                                   Social Drivers of Health (SDOH) Interventions SDOH Screenings   Food Insecurity: No Food Insecurity (05/13/2024)  Housing: Low Risk  (05/13/2024)  Transportation Needs: No Transportation Needs (05/13/2024)  Utilities: Not At Risk (05/13/2024)  Depression (PHQ2-9): Low Risk  (04/12/2023)  Social Connections: Unknown (05/13/2024)  Tobacco Use: Medium Risk (05/13/2024)      Readmission Risk Interventions     No data to display

## 2024-05-17 NOTE — Plan of Care (Signed)
  Problem: Tissue Perfusion: Goal: Adequacy of tissue perfusion will improve Outcome: Progressing   Problem: Clinical Measurements: Goal: Will remain free from infection Outcome: Progressing Goal: Respiratory complications will improve Outcome: Progressing Goal: Cardiovascular complication will be avoided Outcome: Progressing   Problem: Elimination: Goal: Will not experience complications related to urinary retention Outcome: Progressing   Problem: Safety: Goal: Ability to remain free from injury will improve Outcome: Progressing

## 2024-05-18 LAB — CULTURE, BLOOD (ROUTINE X 2)
Culture: NO GROWTH
Special Requests: ADEQUATE

## 2024-05-21 ENCOUNTER — Encounter: Payer: Medicare (Managed Care) | Admitting: Internal Medicine

## 2024-05-21 ENCOUNTER — Telehealth: Payer: Self-pay | Admitting: Urology

## 2024-05-21 DIAGNOSIS — N390 Urinary tract infection, site not specified: Secondary | ICD-10-CM | POA: Diagnosis not present

## 2024-05-21 DIAGNOSIS — N201 Calculus of ureter: Secondary | ICD-10-CM | POA: Diagnosis not present

## 2024-05-21 DIAGNOSIS — G4733 Obstructive sleep apnea (adult) (pediatric): Secondary | ICD-10-CM | POA: Diagnosis not present

## 2024-05-21 DIAGNOSIS — I4811 Longstanding persistent atrial fibrillation: Secondary | ICD-10-CM | POA: Diagnosis not present

## 2024-05-21 DIAGNOSIS — E119 Type 2 diabetes mellitus without complications: Secondary | ICD-10-CM | POA: Diagnosis not present

## 2024-05-21 DIAGNOSIS — Z7901 Long term (current) use of anticoagulants: Secondary | ICD-10-CM | POA: Diagnosis not present

## 2024-05-21 DIAGNOSIS — F0282 Dementia in other diseases classified elsewhere, unspecified severity, with psychotic disturbance: Secondary | ICD-10-CM | POA: Diagnosis not present

## 2024-05-21 DIAGNOSIS — G20A1 Parkinson's disease without dyskinesia, without mention of fluctuations: Secondary | ICD-10-CM | POA: Diagnosis not present

## 2024-05-21 DIAGNOSIS — A419 Sepsis, unspecified organism: Secondary | ICD-10-CM | POA: Diagnosis not present

## 2024-05-21 DIAGNOSIS — G47419 Narcolepsy without cataplexy: Secondary | ICD-10-CM | POA: Diagnosis not present

## 2024-05-21 DIAGNOSIS — N139 Obstructive and reflux uropathy, unspecified: Secondary | ICD-10-CM | POA: Diagnosis not present

## 2024-05-21 DIAGNOSIS — B961 Klebsiella pneumoniae [K. pneumoniae] as the cause of diseases classified elsewhere: Secondary | ICD-10-CM | POA: Diagnosis not present

## 2024-05-21 NOTE — Telephone Encounter (Signed)
 Patients wife called again and LVM about this same information and wants to come in sooner then 08/13.

## 2024-05-21 NOTE — Telephone Encounter (Signed)
 Kerrion Sisco called and wanted to know if we had any sooner appts for the patient. I checked the schedule and as of right now we do not. I called and left him a voicemail and let him know.  Rosaline

## 2024-05-22 ENCOUNTER — Institutional Professional Consult (permissible substitution): Payer: Medicare (Managed Care) | Admitting: Neurology

## 2024-05-22 NOTE — Progress Notes (Unsigned)
 Assessment: 1.  Large left renal pelvic stone.  14 mm, at last measurement in April of this year.  Asymptomatic although he does have gross hematuria.  He is on Eliquis  2.  2 left distal ureteral calculi on April CT scan.  No hydronephrosis.  He has been on tamsulosin  for 3 months.  More than likely, based on size, he has passed these  3.  Gross hematuria, most likely related to problem #1  4.  BPH based on size criteria  5.  Most recently admitted for urosepsis with left ureteral calculi, stented.  Urine appears clear today   Plan: 1.  His urine was cultured.  I would plan on keeping him on suppression for now  2.  I did discuss management of his situation-leaving stent in for now, no planned intervention versus eventual shockwave lithotripsy versus ureteroscopic management with laser.  With his overall grave medical condition, I do not think any anesthetic procedure is recommended at this point.  3.  Start cephalexin  250 mg p.o. nightly for antibiotic suppression, continue until I given order to stop  4.  Office visit in 3 months.  On the way up to this floor we will get a KUB    History of Present Illness:  Initially seen June 30 of this year for evaluation of a left renal calculus. . He does have a history of gross hematuria.  He was admitted to the hospital in March for failure to thrive with advanced Parkinson's disease with cognitive deficit.  The patient is in a skilled care facility at this time, at [name].  He is wheelchair confined.  He has been followed at [facility] for his left renal calculi as well as BPH and hematuria.  He currently has no complaints of left-sided flank pain.  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  CT scan from April 3 of this year also revealed 2 small left lower ureteral stones.  There was no evident hydronephrosis.  He passes most of his urine in a diaper.  He also has some fecal incontinence.  7.13.2025: Underwent urgent  placement of left ureteral stent by Dr. Francisca for obstructing left distal ureteral stone as well as UTI/sepsis. Culture + for klebsiella res to macrobid/ampicillin  7.23.2025: Here today for follow-up.  Prior to his admission on the 12th of last month, he did have cognitive decline, perhaps at this point felt to be due to underlying urinary tract infection.   Past Medical History:  Past Medical History:  Diagnosis Date   Arthritis    R shoulder, bone spur   Arthritis    hands (03/25/2016)   Chronic diastolic heart failure (HCC)    Chronic lower back pain    Coronary heart disease    Dr Esmeralda Sharps   [facility]    Diverticulitis 12/2021   DVT (deep venous thrombosis) (HCC) 05/2022   left leg   H/O cardiovascular stress test    perhaps last one was 2009   Heart murmur    12/29/11 echo: mild MR, no AS, trivial TR   History of gout    Hyperlipidemia    Hypertension    saw Dr. HILARIO Sharps last earl;y- 2014, cardiac cath. last done ?2009, blocks seen didn't require any intervention at that point.   Narcolepsy    [name] 04-11-97; Mean Latency 1.58min, SOREM 2   OSA on CPAP    NPSG 12-13-98 AHI 22.7   Parkinson's disease (HCC)  Vascular   PONV (postoperative nausea and vomiting)    Presence of permanent cardiac pacemaker    REM sleep behavior disorder    Shortness of breath    with exertion    Stroke (HCC)    2021. Slight right sided weakness   Type II diabetes mellitus [facility])     Past Surgical History:  Past Surgical History:  Procedure Laterality Date   CARDIAC CATHETERIZATION  2009?   CYSTOSCOPY W/ URETERAL STENT PLACEMENT Left 05/13/2024   Procedure: CYSTOSCOPY WITH URETERAL STENT INSERTION;  Surgeon: Francisca Redell BROCKS, MD;  Location[facility] OR;  Service: Urology;  Laterality: Left;   EP IMPLANTABLE DEVICE N/A 03/25/2016   Procedure: Pacemaker Implant - Dual Chamber;  Surgeon: Danelle LELON Birmingham, MD;  Location: [facility] INVASIVE CV LAB;  Service: Cardiovascular;  Laterality: N/A;    FRACTURE SURGERY     HIP FRACTURE SURGERY     INSERT / REPLACE / REMOVE PACEMAKER  03/24/2016   INTRAMEDULLARY (IM) NAIL INTERTROCHANTERIC Left 02/08/2022   Procedure: INTRAMEDULLARY (IM) NAIL INTERTROCHANTRIC;  Surgeon: Addie Cordella Hamilton, MD;  Location: [facility] OR;  Service: Orthopedics;  Laterality: Left;   IR KYPHO LUMBAR INC FX REDUCE BONE BX UNI/BIL CANNULATION INC/IMAGING  12/29/2021   IR RADIOLOGIST EVAL & MGMT  12/15/2021   IR RADIOLOGY PERIPHERAL GUIDED IV START  07/14/2022   IR US  GUIDE VASC ACCESS RIGHT  07/15/2022   LEFT AND RIGHT HEART CATHETERIZATION WITH CORONARY ANGIOGRAM N/A 06/05/2014   Procedure: LEFT AND RIGHT HEART CATHETERIZATION WITH CORONARY ANGIOGRAM;  Surgeon: Victory LELON Claudene DOUGLAS, MD;  Location: [facility] CATH LAB;  Service: Cardiovascular;  Laterality: N/A;   NASAL FRACTURE SURGERY     SHOULDER ARTHROSCOPY WITH ROTATOR CUFF REPAIR AND SUBACROMIAL DECOMPRESSION Right 08/16/2013   Procedure: SHOULDER ARTHROSCOPY WITH ROTATOR CUFF REPAIR AND SUBACROMIAL DECOMPRESSION;  Surgeon: Eva Elsie Herring, MD;  Location: [facility] OR;  Service: Orthopedics;  Laterality: Right;  Right shoulder arthroscopy rotator cuff repair, subacromial decompression.   TONSILLECTOMY      Allergies:  Allergies  Allergen Reactions   Amantadines Other (See Comments)    Mental changes   Clonazepam  Other (See Comments)    Mental changes   Lisinopril  Other (See Comments)    Unknown reaction   Zoloft  [Sertraline ] Other (See Comments)    Mental changes    Family History:  Family History  Problem Relation Age of Onset   Sleep apnea Sister    Colon cancer Sister     Social History:  Social History   Tobacco Use   Smoking status: Former    Current packs/day: 0.00    Average packs/day: 2.5 packs/day for 6.0 years (15.0 ttl pk-yrs)    Types: Cigarettes    Start date: 06/04/1961    Quit date: 06/05/1967    Years since quitting: 57.0   Smokeless tobacco: Never  Vaping Use   Vaping status: Never Used   Substance Use Topics   Alcohol  use: Yes    Comment: rare   Drug use: No    Review of symptoms:  Constitutional:  Negative for unexplained weight loss, night sweats, fever, chills ENT:  Negative for nose bleeds, sinus pain, painful swallowing CV:  Negative for chest pain, shortness of breath, exercise intolerance, palpitations, loss of consciousness Resp:  Negative for cough, wheezing, shortness of breath GI:  Negative for nausea, vomiting, diarrhea, bloody stools GU:  Positives noted in HPI; otherwise negative for gross hematuria, dysuria, urinary incontinence Neuro:  Negative for seizures, poor balance, limb  weakness, slurred speech Psych:  Negative for lack of energy, depression, anxiety Endocrine:  Negative for polydipsia, polyuria, symptoms of hypoglycemia (dizziness, hunger, sweating) Hematologic:  Negative for anemia, purpura, petechia, prolonged or excessive bleeding, use of anticoagulants  Allergic:  Negative for difficulty breathing or choking as a result of exposure to anything; no shellfish allergy; no allergic response (rash/itch) to materials, foods  Physical exam: There were no vitals taken for this visit. GENERAL APPEARANCE: Somewhat lethargic, in a wheelchair HEENT: Atraumatic, Normocephalic. NECK: Normal appearance LUNGS: Normal inspiratory and expiratory excursion HEART: Regular Rate EXTREMITIES: Moves all extremities well.  Without clubbing, cyanosis, or edema. NEUROLOGIC:  Alert and oriented x 3, normal gait, CN II-XII grossly intact.  MENTAL STATUS:  Appropriate. SKIN:  Warm, dry and intact.    Results:   I have reviewed prior imaging--CT images from most recent hospitalization.  Skin to stone distance 8 cm, Hounsfield units 850.  Prostate volume measurement approximately 50 mL  I have reviewed [facility] records as well as most recent [place] records  Recent urine culture reviewed  I/O catheterization was performed to collect urine

## 2024-05-23 ENCOUNTER — Ambulatory Visit: Payer: Medicare (Managed Care) | Admitting: Urology

## 2024-05-23 VITALS — BP 100/65 | HR 70 | Ht 69.0 in | Wt 200.0 lb

## 2024-05-23 DIAGNOSIS — N201 Calculus of ureter: Secondary | ICD-10-CM

## 2024-05-23 DIAGNOSIS — Z87440 Personal history of urinary (tract) infections: Secondary | ICD-10-CM | POA: Diagnosis not present

## 2024-05-23 DIAGNOSIS — F5102 Adjustment insomnia: Secondary | ICD-10-CM | POA: Diagnosis not present

## 2024-05-23 DIAGNOSIS — F03B2 Unspecified dementia, moderate, with psychotic disturbance: Secondary | ICD-10-CM | POA: Diagnosis not present

## 2024-05-23 DIAGNOSIS — N200 Calculus of kidney: Secondary | ICD-10-CM

## 2024-05-23 DIAGNOSIS — N1330 Unspecified hydronephrosis: Secondary | ICD-10-CM

## 2024-05-23 DIAGNOSIS — N202 Calculus of kidney with calculus of ureter: Secondary | ICD-10-CM | POA: Diagnosis not present

## 2024-05-23 DIAGNOSIS — N400 Enlarged prostate without lower urinary tract symptoms: Secondary | ICD-10-CM

## 2024-05-23 DIAGNOSIS — L989 Disorder of the skin and subcutaneous tissue, unspecified: Secondary | ICD-10-CM | POA: Diagnosis not present

## 2024-05-23 DIAGNOSIS — A4150 Gram-negative sepsis, unspecified: Secondary | ICD-10-CM

## 2024-05-23 DIAGNOSIS — Z8619 Personal history of other infectious and parasitic diseases: Secondary | ICD-10-CM

## 2024-05-23 DIAGNOSIS — G4752 REM sleep behavior disorder: Secondary | ICD-10-CM | POA: Diagnosis not present

## 2024-05-23 LAB — URINALYSIS, ROUTINE W REFLEX MICROSCOPIC
Bilirubin, UA: NEGATIVE
Glucose, UA: NEGATIVE
Ketones, UA: NEGATIVE
Nitrite, UA: NEGATIVE
Specific Gravity, UA: 1.02 (ref 1.005–1.030)
Urobilinogen, Ur: 0.2 mg/dL (ref 0.2–1.0)
pH, UA: 7 (ref 5.0–7.5)

## 2024-05-23 LAB — MICROSCOPIC EXAMINATION: RBC, Urine: >30 /HPF — AB (ref 0–2)

## 2024-05-25 ENCOUNTER — Encounter: Payer: Self-pay | Admitting: Neurology

## 2024-05-25 ENCOUNTER — Ambulatory Visit (INDEPENDENT_AMBULATORY_CARE_PROVIDER_SITE_OTHER): Payer: Medicare (Managed Care) | Admitting: Neurology

## 2024-05-25 ENCOUNTER — Ambulatory Visit: Payer: Self-pay

## 2024-05-25 VITALS — BP 78/62 | HR 72 | Wt 185.0 lb

## 2024-05-25 DIAGNOSIS — G20C Parkinsonism, unspecified: Secondary | ICD-10-CM | POA: Diagnosis not present

## 2024-05-25 DIAGNOSIS — Z8619 Personal history of other infectious and parasitic diseases: Secondary | ICD-10-CM | POA: Diagnosis not present

## 2024-05-25 DIAGNOSIS — Z9289 Personal history of other medical treatment: Secondary | ICD-10-CM | POA: Diagnosis not present

## 2024-05-25 DIAGNOSIS — R5381 Other malaise: Secondary | ICD-10-CM | POA: Diagnosis not present

## 2024-05-25 LAB — URINE CULTURE: Organism ID, Bacteria: NO GROWTH

## 2024-05-25 NOTE — Telephone Encounter (Signed)
 Spoke with pt wife, Burnard, in reference to -ucx. Burnard voiced understanding.

## 2024-05-25 NOTE — Patient Instructions (Signed)
 I will recommend reduction of your levodopa  and your Seroquel  to Dr. Rosemarie.  Please follow-up with all your providers or make an appointment if need be.

## 2024-05-25 NOTE — Progress Notes (Signed)
 Subjective:    Patient ID: Jordan [name] is a 83 y.o. male.  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  The patient is accompanied by his wife today.  As you know, Jordan [name] is an 83 year old male with an underlying complex medical history of coronary artery disease, chronic diastolic heart failure, hypertension, hyperlipidemia, sleep apnea, stroke, diabetes, arthritis, low back pain, diverticulitis, DVT, gout, history of narcolepsy (per chart review), hip fracture with status post left intramedullary nail placement, status post pacemaker placement, and mildly overweight state, who reports very little of his own history, essentially unable to provide any information.  History is provided by his wife.  She reports that the patient has had a steady decline in his mobility and functioning level.  She feels that he is sleeping and too sedated.  She is worried about medication side effects, she is clearly frustrated and feels helpless, she is tearful during this visit reporting that no one takes charge of his care and helps with medication management.  She has not been able to talk to the doctor at the rehab facility.  He is currently at [name] until the 30th.  He was recently discharged from the hospital.  He was hospitalized last week for about 5 days for sepsis secondary to UTI.  He had a left ureteric stent placement and was treated with antibiotics including Rocephin . She admits that he was worse before he was hospitalized in terms of his functioning level.  However, he is not participating much in therapy.  He has low blood pressure.  He cannot walk with a walker.  He is currently in a wheelchair.  She reports that he will probably go to a long-term living facility next week.  She feels that staying at [facility] has been a waste of time.  She is also frustrated  that coming here felt like a waste of time.  I reviewed your office note from 05/10/2024.  He has a history of frequent falls.  He was admitted to the hospital in May 2025 with encephalopathy deemed secondary to UTI.  He was also admitted to the hospital in May 2025 for suspected TIA.  He had an EEG on 03/28/2024 while hospitalized and I reviewed the results:  IMPRESSION: This study is suggestive of moderate diffuse encephalopathy. No seizures or epileptiform discharges were seen throughout the recording.   He had an EEG while hospitalized on 03/22/2024 and I reviewed the results:   IMPRESSION: This study is suggestive of moderate diffuse encephalopathy. No seizures or epileptiform discharges were seen throughout the recording.   He had a brain MRI without contrast on 03/28/2024 and I reviewed the results:   IMPRESSION:  1. No acute intracranial abnormality.  2. Stable chronic encephalomalacia of the anterior left frontal lobe and remote  medial right occipital lobe infarct.  3. Stable, mild periventricular white matter T2 hyperintensities.  4. Study limited by patient motion and early termination.    He had an echocardiogram while in the hospital on 03/22/2024 and I reviewed the results: EF was 55 to 60% LV function was deemed normal.  No aortic stenosis was noted.   He is currently on multiple medications including psychotropic medications.  He is currently on Seroquel  50 mg at bedtime, also takes Provigil  100 mg daily.  He is on long-acting carbidopa -levodopa  25-100 mg strength  1 pill 3 times daily.  He is also on ProAmatine .  He takes melatonin at night.  He is on Xarelto .  He is also on Exelon .  Full list of medication as below.   He has been referred to [facility] neurology as well as [facility] and [facility].  I had evaluated him for parkinsonism concern for second opinion in the past.   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  The patient is accompanied by his wife today.  He is usually followed by Dr. Rosemarie and Harlene Bogaert, and has been followed by Dr. Rosemarie since 2019.  I had evaluated him about a year and a half ago for gait disorder, concern for parkinsonism.  I did not feel he had telltale parkinsonism at the time.  We had talked about his vascular risk factors and he was advised to follow-up with Dr. Rosemarie.  He had a recent visit with Dr. Rosemarie on 05/15/2021 and I reviewed the office note.  He was on Sinemet  1 pill 3 times daily at the time and it was advised that he could take it twice daily.   His wife reports that she requested a follow-up with me to evaluate him for Parkinson's disease.  She reports that he has had progressive limb slowness and shuffling in his gait and a stoop in his posture.  He has had back pain and has not been able to stand or walk for any prolonged period of time.  He uses a walking stick.  He is currently in physical therapy.  He has fallen.  He reports that he typically feels off balance and has a tendency to veer backwards.  He fell last week but most of his falls are not complete falls, they are either braced or broken falls.  He did not hit his head.  He is able to stand up on his own if he is able to get on his hands and knees.  She reports that he needs assistance with dressing.  He has not had much in the way of symptom change since his appointment with Dr. Rosemarie.  He does not hydrate very well.  He does like to drink coffee in the morning.  He may drink up to 2 bottles of water per day.  He drinks typically 1 cup of orange juice per day.  For his back pain he has seen a [facility].  Sometimes he feels dizzy when he first sits up or stands up especially when he wakes up in the morning.  He has had  sleepiness during the day.  His pulmonologist had placed him on Adderall but he is no longer taking it.  He has sleep apnea and uses his CPAP machine.    12/11/19: reports an intermittent tremor in the right hand for the past year, he has had mild difficulty with fine motor control, thankfully no recent falls.  He has had problems with his walking with intermittent shuffling noted.  His wife reports that some days he seems to not pick up his feet very well at all.  He also indicates intermittent trouble with his memory and sometimes he feels confused transiently when he wakes up from a nap or from sleep.  Sometimes it takes him a little bit to get more oriented in his home environment.  He had some visual disturbances, he is scheduled to see an ophthalmologist.  He has had some glare while driving at night.  He does drive to [place].  He had a brain MRI without contrast on 02/17/2018 and I reviewed the results: IMPRESSION: 1. Punctate foci of acute ischemia within both frontal and parietal lobes and the right cerebellar hemisphere. No associated hemorrhage or mass effect. 2. The distribution of ischemic lesions involves multiple vascular territories and is suggestive of a central cardiac or proximal aortic embolic source. CTA of the neck may be helpful for further assessment. 3. Mild right middle cerebral artery M2 segment irregularity may indicate mild atherosclerotic disease, though the source data images suggest that this might be artifactual. No occlusion or hemodynamically significant stenosis within the intracranial circulation. 4. Multiple old infarcts, including left frontal cortical infarct, and findings of chronic ischemic microangiopathy.   I reviewed your office note from 11/26/2019.  He reports using his CPAP.  His sleep is fairly restful.  He has not had any sudden onset of one-sided weakness or numbness recently.  Weight has been stable.  He may not always hydrate well with water.  No  family history of Parkinson's disease.   His Past Medical History Is Significant For: Past Medical History:  Diagnosis Date   Arthritis    R shoulder, bone spur   Arthritis    hands (03/25/2016)   Chronic diastolic heart failure (HCC)    Chronic lower back pain    Coronary heart disease    Dr Esmeralda Sharps   Dementia [facility])    Diverticulitis 12/2021   DVT (deep venous thrombosis) (HCC) 05/2022   left leg   H/O cardiovascular stress test    perhaps last one was 2009   Heart murmur    12/29/11 echo: mild MR, no AS, trivial TR   History of gout    Hyperlipidemia    Hypertension    saw Dr. HILARIO Sharps last earl;y- 2014, cardiac cath. last done ?2009, blocks seen didn't require any intervention at that point.   Narcolepsy    [name] 04-11-97; Mean Latency 1.33min, SOREM 2   OSA on CPAP    NPSG 12-13-98 AHI 22.7   Parkinson's disease (HCC)    Vascular   PONV (postoperative nausea and vomiting)    Presence of permanent cardiac pacemaker    REM sleep behavior disorder    Shortness of breath    with exertion    Stroke (HCC)    2021. Slight right sided weakness   Type II diabetes mellitus (HCC)     His Past Surgical History Is Significant For: Past Surgical History:  Procedure Laterality Date   CARDIAC CATHETERIZATION  2009?   CYSTOSCOPY W/ URETERAL STENT PLACEMENT Left 05/13/2024   Procedure: CYSTOSCOPY WITH URETERAL STENT INSERTION;  Surgeon: Francisca Redell BROCKS, MD;  Location[facility] OR;  Service: Urology;  Laterality: Left;   EP IMPLANTABLE DEVICE N/A 03/25/2016   Procedure: Pacemaker Implant - Dual Chamber;  Surgeon: Danelle LELON Birmingham, MD;  Location: [facility] INVASIVE CV LAB;  Service: Cardiovascular;  Laterality: N/A;   FRACTURE SURGERY     HIP FRACTURE SURGERY     INSERT / REPLACE / REMOVE PACEMAKER  03/24/2016   INTRAMEDULLARY (IM) NAIL INTERTROCHANTERIC Left 02/08/2022   Procedure: INTRAMEDULLARY (IM) NAIL INTERTROCHANTRIC;  Surgeon: Addie Cordella Hamilton, MD;  Location: [facility] OR;  Service:  Orthopedics;  Laterality: Left;   IR KYPHO LUMBAR INC FX REDUCE BONE BX UNI/BIL CANNULATION INC/IMAGING  12/29/2021  IR RADIOLOGIST EVAL & MGMT  12/15/2021   IR RADIOLOGY PERIPHERAL GUIDED IV START  07/14/2022   IR US  GUIDE VASC ACCESS RIGHT  07/15/2022   LEFT AND RIGHT HEART CATHETERIZATION WITH CORONARY ANGIOGRAM N/A 06/05/2014   Procedure: LEFT AND RIGHT HEART CATHETERIZATION WITH CORONARY ANGIOGRAM;  Surgeon: Victory LELON Claudene DOUGLAS, MD;  Location: [facility] CATH LAB;  Service: Cardiovascular;  Laterality: N/A;   NASAL FRACTURE SURGERY     SHOULDER ARTHROSCOPY WITH ROTATOR CUFF REPAIR AND SUBACROMIAL DECOMPRESSION Right 08/16/2013   Procedure: SHOULDER ARTHROSCOPY WITH ROTATOR CUFF REPAIR AND SUBACROMIAL DECOMPRESSION;  Surgeon: Eva Elsie Herring, MD;  Location: [facility] OR;  Service: Orthopedics;  Laterality: Right;  Right shoulder arthroscopy rotator cuff repair, subacromial decompression.   TONSILLECTOMY      His Family History Is Significant For: Family History  Problem Relation Age of Onset   Sleep apnea Sister    Colon cancer Sister     His Social History Is Significant For: Social History   Socioeconomic History   Marital status: Married    Spouse name: Burnard   Number of children: Not on file   Years of education: Not on file   Highest education level: Not on file  Occupational History   Occupation: [employer]   Occupation: [place] [employer]  Tobacco Use   Smoking status: Former    Current packs/day: 0.00    Average packs/day: 2.5 packs/day for 6.0 years (15.0 ttl pk-yrs)    Types: Cigarettes    Start date: 06/04/1961    Quit date: 06/05/1967    Years since quitting: 57.0   Smokeless tobacco: Never  Vaping Use   Vaping status: Never Used  Substance and Sexual Activity   Alcohol  use: Yes    Comment: rare   Drug use: No   Sexual activity: Not Currently  Other Topics Concern   Not on file  Social History Narrative   Lives with wife and son   Right handed    Drinks 1 cups caffeine daily   Social Drivers of Health   Financial Resource Strain: Not on file  Food Insecurity: No Food Insecurity (05/13/2024)   Hunger Vital Sign    Worried About Running Out of Food in the Last Year: Never true    Ran Out of Food in the Last Year: Never true  Transportation Needs: No Transportation Needs (05/13/2024)   PRAPARE - Administrator, Civil Service (Medical): No    Lack of Transportation (Non-Medical): No  Physical Activity: Not on file  Stress: Not on file  Social Connections: Unknown (05/13/2024)   Social Connection and Isolation Panel    Frequency of Communication with Friends and Family: Three times a week    Frequency of Social Gatherings with Friends and Family: Three times a week    Attends Religious Services: More than 4 times per year    Active Member of Clubs or Organizations: Yes    Attends Banker Meetings: Not on file    Marital Status: Not on file    His Allergies Are:  Allergies  Allergen Reactions   Amantadines Other (See Comments)    Mental changes   Clonazepam  Other (See Comments)    Mental changes   Lisinopril  Other (See Comments)    Unknown reaction   Zoloft  [Sertraline ] Other (See Comments)    Mental changes  :   His Current Medications Are:  Outpatient Encounter Medications as of 05/25/2024  Medication Sig   acetaminophen  (TYLENOL ) 500 MG  tablet Take 1,000 mg by mouth every 6 (six) hours as needed for moderate pain (pain score 4-6) or mild pain (pain score 1-3).   atorvastatin  (LIPITOR) 20 MG tablet Take 20 mg by mouth at bedtime.   b complex vitamins capsule Take 1 capsule by mouth at bedtime.   Camphor-Menthol-Methyl Sal (HM SALONPAS PAIN RELIEF EX) Apply 1 patch topically daily as needed (Back pain).   carbidopa -levodopa  (SINEMET  CR) 50-200 MG tablet Take 1 tablet by mouth at bedtime.   Carbidopa -Levodopa  ER (SINEMET  CR) 25-100 MG tablet controlled release Take 1 tablet by mouth 3 (three) times  daily with meals. (Patient taking differently: Take 1 tablet by mouth 3 (three) times daily with meals. Take one tablet by mouth three times daily at 0800, 1200, and 1730)   Cholecalciferol  1.25 MG (50000 UT) capsule Take 50,000 Units by mouth once a week. Take one capsule by  mouth every Tuesday.   clotrimazole-betamethasone (LOTRISONE) cream Apply 1 Application topically daily as needed (Rash).   feeding supplement, GLUCERNA SHAKE, (GLUCERNA SHAKE) LIQD Take 237 mLs by mouth 2 (two) times daily between meals. Alternate with ensure Diabetic   insulin  lispro (HUMALOG) 100 UNIT/ML injection Inject 0-10 Units into the skin 4 (four) times daily -  before meals and at bedtime. Sliding scale as follows;    0-200=   0 units 201-250= 2 units 251-300= 4 units 301-350= 6 units 351-400= 8 units 400= 10 units PUSH FLUIDS  and repeat CBG  in 2 hours. IF reading is still >400, notify doctor.   JANUVIA 100 MG tablet Take 100 mg by mouth daily.   lidocaine  (LIDODERM ) 5 % Place 1 patch onto the skin daily as needed. Purchase over the counter. On for 12 hours and off for 12 hours   magnesium  oxide (MAG-OX) 400 (240 Mg) MG tablet Take 1 tablet (400 mg total) by mouth 2 (two) times daily.   Melatonin 5 MG CAPS Take 5 mg by mouth at bedtime.   metFORMIN  (GLUCOPHAGE ) 1000 MG tablet Take 1,000 mg by mouth 2 (two) times daily.   midodrine  (PROAMATINE ) 2.5 MG tablet Take 2.5 mg by mouth daily as needed (Cardiovascular support). Take one tablet by mouth daily as needed for SBP <120   modafinil  (PROVIGIL ) 100 MG tablet Take 100 mg by mouth daily.   NITROSTAT  0.4 MG SL tablet PLACE 1 TABLET (0.4 MG TOTAL) UNDER THE TONGUE EVERY 5 (FIVE) MINUTES AS NEEDED FOR CHEST PAIN.   polyethylene glycol (MIRALAX  / GLYCOLAX ) 17 g packet Take 17 g by mouth 2 (two) times daily. (Patient taking differently: Take 17 g by mouth daily as needed.)   potassium chloride  (KLOR-CON  M) 10 MEQ tablet Take 1 tablet (10 mEq total) by mouth daily.    QUEtiapine  (SEROQUEL ) 50 MG tablet Take 50 mg by mouth at bedtime.   rivaroxaban  (XARELTO ) 20 MG TABS tablet Take 1 tablet (20 mg total) by mouth daily.   rivastigmine  (EXELON ) 9.5 mg/24hr Place 9.5 mg onto the skin daily.   senna (SENOKOT) 8.6 MG TABS tablet Take 1 tablet (8.6 mg total) by mouth daily.   tamsulosin  (FLOMAX ) 0.4 MG CAPS capsule Take 1 capsule (0.4 mg total) by mouth at bedtime.   No facility-administered encounter medications on file as of 05/25/2024.  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  Since seen here, seen ED for 5 days, sepsis and UTI, now living at [facility].  Bp low rechecked manually.      Objective:  Neurological Exam  Physical Exam Physical Examination:   Vitals:   05/25/24 1053 05/25/24 1057  BP: (!) 79/61 (!) 78/62  Pulse: 72     General Examination: The patient is in a wheelchair, frail and deconditioned appearing, fidgety, unable to provide his own history, answers a few simple questions.    HEENT: Normocephalic, atraumatic, pupils are equal, round and reactive to light, tracking is impaired but still able to look side-to-side and there is some limitation to upgaze.  Moderately forward stoop of his neck but is able to move neck fairly well.  Moderate mouth dryness noted.  Tongue protrudes centrally.  Chest: Clear to auscultation without wheezing, rhonchi or crackles noted.  Heart: S1+S2+0, regular and normal without murmurs, rubs or gallops noted.   Abdomen: Soft, non-tender and non-distended.  Extremities: There is no obvious swelling but has compression stockings up to the knees bilaterally.   Skin: Warm and dry without trophic changes noted.   Musculoskeletal: exam  reveals no obvious joint deformities.    Neurologically:  Mental status: The patient is awake, does not pay full attention, unable to provide history, memory is impaired.   Fidgety.  Cranial nerves II - XII are as described above under HEENT exam. Motor exam: Thin bulk, global strength of about 4 out of 5, no focal weakness noted, no focal atrophy noted.  Fine motor skills impaired globally.  No obvious resting tremor.   Cerebellar testing: No obvious dysmetria.   Sensory exam: intact to light touch.   Posture is stooped forward in his wheelchair, I did not have him stand or walk for me today as he does not walk independently any longer.     Assessment and Plan:    In summary, SAVYON LOKEN is an an 83 year old male with an underlying complex medical history of coronary artery disease, chronic diastolic heart failure, hypertension, hyperlipidemia, sleep apnea, stroke, diabetes, arthritis, low back pain, diverticulitis, DVT, gout, history of narcolepsy (per chart review), hip fracture with status post left intramedullary nail placement, status post pacemaker placement, and mildly overweight state, who presents for evaluation of his parkinsonism.  He has been on levodopa  therapy long-acting, 25-100 mg strength 1 pill 3 times daily and 50-200 mg strength 1 pill at bedtime.  His wife feels that he is overmedicated with a lot of other medications also on his list.  You can certainly try to scale back on the levodopa  as he has had hallucinations, keep in mind that he has had serious setbacks in the past few months with multiple hospitalizations in the last 3 months, last hospitalization for urosepsis just a couple of weeks ago.  I explained to the patient's wife that this can exacerbate mobility, cognitive function, increased risk for hypotension, hallucinations, and fall risk as well as sleepiness.  Unfortunately, there is not a whole lot I can add at this point.  She is worried about the Seroquel , you can  certainly also reduce the Seroquel  at this time.  She is advised to talk to you about this and make a follow-up with you or Harlene.  They have been referred to [facility] neurology as I understand and have an appointment for December 2025.  Patient's wife is very frustrated and tearful today, she  feels helpless and not heard.  I advised her to get in touch with the rehab supervising physician as well to discuss her concerns about overmedication.  He is followed by multiple specialists.  I did not see any telltale signs of idiopathic Parkinson's disease or PSP which was your main concern.  Unfortunately, his history and presentation are very complicated and it may be very difficult to discern if he even has atypical parkinsonism at this point.  I think it is worth cutting back on the levodopa , I would recommend cutting out the long-acting dose at bedtime and may be scaling back on the Seroquel  if you agree.  He will be at [name] until 05/30/2024.  Like I said, unfortunately, there is probably not a whole lot I can add at this time.  Thank you for letting me weigh in.  I answered all her questions today and the patient's wife was agreeable to being in touch with you and keeping any pending appointments and making follow-up appointments with his other specialist as needed. I spent 60 minutes in total face-to-face time and in reviewing records during pre-charting, more than 50% of which was spent in counseling and coordination of care, reviewing test results, reviewing medications and treatment regimen and/or in discussing or reviewing the diagnosis of parkinsonism, recent hospitalization, history of sepsis, deconditioning, mobility decline, the prognosis and treatment options. Pertinent laboratory and imaging test results that were available during this visit with the patient were reviewed by me and considered in my medical decision making (see chart for details).

## 2024-05-29 ENCOUNTER — Encounter: Payer: Medicare (Managed Care) | Admitting: Physical Medicine and Rehabilitation

## 2024-06-01 DIAGNOSIS — G4733 Obstructive sleep apnea (adult) (pediatric): Secondary | ICD-10-CM | POA: Diagnosis not present

## 2024-06-04 ENCOUNTER — Ambulatory Visit: Payer: Medicare (Managed Care) | Admitting: Urology

## 2024-06-08 DIAGNOSIS — N201 Calculus of ureter: Secondary | ICD-10-CM | POA: Diagnosis not present

## 2024-06-08 DIAGNOSIS — K5900 Constipation, unspecified: Secondary | ICD-10-CM | POA: Diagnosis not present

## 2024-06-08 DIAGNOSIS — R319 Hematuria, unspecified: Secondary | ICD-10-CM | POA: Diagnosis not present

## 2024-06-11 DIAGNOSIS — N200 Calculus of kidney: Secondary | ICD-10-CM | POA: Diagnosis not present

## 2024-06-11 DIAGNOSIS — Z950 Presence of cardiac pacemaker: Secondary | ICD-10-CM | POA: Diagnosis not present

## 2024-06-11 DIAGNOSIS — G4733 Obstructive sleep apnea (adult) (pediatric): Secondary | ICD-10-CM | POA: Diagnosis not present

## 2024-06-11 DIAGNOSIS — E7800 Pure hypercholesterolemia, unspecified: Secondary | ICD-10-CM | POA: Diagnosis not present

## 2024-06-11 DIAGNOSIS — G214 Vascular parkinsonism: Secondary | ICD-10-CM | POA: Diagnosis not present

## 2024-06-11 DIAGNOSIS — N390 Urinary tract infection, site not specified: Secondary | ICD-10-CM | POA: Diagnosis not present

## 2024-06-11 DIAGNOSIS — I10 Essential (primary) hypertension: Secondary | ICD-10-CM | POA: Diagnosis not present

## 2024-06-11 DIAGNOSIS — E1151 Type 2 diabetes mellitus with diabetic peripheral angiopathy without gangrene: Secondary | ICD-10-CM | POA: Diagnosis not present

## 2024-06-11 DIAGNOSIS — I2510 Atherosclerotic heart disease of native coronary artery without angina pectoris: Secondary | ICD-10-CM | POA: Diagnosis not present

## 2024-06-11 DIAGNOSIS — I679 Cerebrovascular disease, unspecified: Secondary | ICD-10-CM | POA: Diagnosis not present

## 2024-06-11 DIAGNOSIS — F5101 Primary insomnia: Secondary | ICD-10-CM | POA: Diagnosis not present

## 2024-06-11 DIAGNOSIS — I951 Orthostatic hypotension: Secondary | ICD-10-CM | POA: Diagnosis not present

## 2024-06-13 ENCOUNTER — Ambulatory Visit: Payer: Medicare (Managed Care) | Admitting: Urology

## 2024-06-14 ENCOUNTER — Encounter: Payer: Self-pay | Admitting: Internal Medicine

## 2024-06-14 ENCOUNTER — Other Ambulatory Visit: Payer: Self-pay | Admitting: Nurse Practitioner

## 2024-06-14 ENCOUNTER — Ambulatory Visit: Payer: Medicare (Managed Care) | Attending: Internal Medicine | Admitting: Internal Medicine

## 2024-06-14 VITALS — BP 100/73 | HR 67 | Resp 16 | Ht 69.0 in | Wt 178.0 lb

## 2024-06-14 DIAGNOSIS — I495 Sick sinus syndrome: Secondary | ICD-10-CM

## 2024-06-14 DIAGNOSIS — D649 Anemia, unspecified: Secondary | ICD-10-CM

## 2024-06-14 DIAGNOSIS — C7B8 Other secondary neuroendocrine tumors: Secondary | ICD-10-CM

## 2024-06-14 NOTE — Progress Notes (Signed)
 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 CT AP on 06/17/22 showed: calcified mesenteric mass with suspected associated small bowel mass; suspicious right hepatic lesion; nodular enhancement along wall of bowel loop in central abdomen.  -abdomen MRI 06/23/22 showed: 3.9 cm mass in right hepatic lobe; small RLQ mesenteric mass or lymphadenopathy. 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 -PET DOTATATE from 08/12/22 showed avid disease within the liver, mesentery and small bowel. -he began Sandostatin  injections on 08/17/22. He reports worsening hyperglycemia, and decreased appetite, which are likely related to the injections.  So we stopped after last injection in 11/2022 -He is clinically stable no increased diarrhea, flushing, or abdominal cramps since he came off the injection.  Continue clinical observation. -other treatment options, such as Afinitor or Lutathera, were reviewed. If he has significant disease progression, new treatment can be initiated.   -restaging CT in 01/2024 showed stable disease  -CT CAP was done on 05/12/2024. There was stable, partially calcified mesenteric lesion. Subtle liver lesions. Resolution of previous cystic lesion in the pancreatic head.  - Plan for rescan based on symptoms and chromogranin A levels.    Hospitalization Patient hospitalized from 05/12/2024 through 05/17/2024 due to sepsis, likely urosepsis.  Had multiple nonobstructing kidney stones.  Now has ureteral stent which can remain  indefinitely.  Taking low-dose antibiotics to prevent further infections.  Patient continues to be weak, losing strength.  Living at skilled nursing facility.  Physical therapy has been discontinued.  Unable to justify continued therapy to insurance.  Patient's family member does mention that the patient did see his primary care provider when he has recommended reevaluation by physical therapy due to muscle atrophy and progressive weakness.  Constipation This is ongoing chronic issue.  Patient is taking regimen of stool softener and MiraLAX .  Will have to use Dulcolax suppository sometimes.  Overall, this is stable and well-managed.  Metastatic neuroendocrine tumor CT chest abdomen pelvis was performed during this most recent hospitalization (05/12/2024).  Kidney and ureteral stones were evident.  There was also a stable, partially calcified mesenteric lesion.  Subtle liver lesions were noted.  There was also resolution of previous cystic lesions in the pancreatic head.  Will continue surveillance and repeat scans as indicated based on symptoms.  Awaiting results of chromogranin A.  Iron  deficiency anemia Today's blood work shows normal Hgb at 13.2 and normal HCT at 39.9.  Iron  saturation slightly decreased, but iron  panel is, overall, unremarkable.  Reticulocyte panel is normal today.  Awaiting results of ferritin.  Parkinson's disease Patient with progressive muscle weakness and fatigue.  Some of this coming from Parkinson's and also medications to help control Parkinson's.  He also has some dementia.  Has been Exelon  patch.  Recently, Seroquel  and trazodone  are both discontinued.  Family states that cognitive abilities are gradually improving.  Patient able to communicate and interact with others much better.  This is gradually improving.  Plan Labs reviewed. -CBC is within normal limits, with mild elevation of WBC at 10.9. - CMP is unremarkable. - Normal reticulocyte panel - Iron  saturation  slightly low with remainder of iron  study unremarkable. - Awaiting ferritin level. - Awaiting chromogranin A. Reviewed CT CAP from 05/12/2024.  This shows a stable and somewhat improved appearance of neuroendocrine tumors in the abdomen.  No evidence of new metastatic disease or lymphadenopathy.  Plan to repeat scan as indicated, based on symptoms.  Continue to monitor chromogranin A levels closely. Labs with follow-up in 3 months, sooner if needed.     The patient understands the plans discussed today and is in agreement with them.  He knows to contact our office if he develops concerns prior to his next appointment.  I provided 30 minutes of face-to-face time during this encounter and > 50% was spent counseling as documented under my assessment and plan.    Jordan FORBES Lessen, NP  [name] Dept: [phone] Dept Fax: [phone]   No orders of the defined types were placed in this encounter.     CHIEF COMPLAINT:  CC: metastatic neuroendocrine tumor  Current Treatment:  observation  INTERVAL HISTORY:  [name] is here today for repeat clinical assessment. He was last seen by Dr. Lanny on 02/07/2024. He has had chronic anemia. Chromogranin A levels have been gradually rising. He did have CT CAP on 05/12/2024 while hospitalized. There was stable, partially calcified mesenteric lesion. Subtle liver lesions. Resolution of previous cystic lesion in the pancreatic head.  He denies abdominal pain.  Does have chronic constipation.  Takes senna and MiraLAX  almost every day.  Will occasionally use Dulcolax suppository.  Now has ureteral stent which drains blood-tinged urine most of the time.  Urology is not concerned as he does have renal and ureteral calculi.  Patient is also on Eliquis.  Patient continues to take low-dose antibiotics to prevent further infection.   Tolerating well.  He denies chest pain, chest pressure, or shortness of breath. He denies headaches or visual disturbances. He denies abdominal pain, nausea, vomiting, or changes in bowel or bladder habits.  He denies fevers or chills. He denies pain. His appetite is good. His weight has been stable.  I have reviewed the past medical history, past surgical history, social history and family history with the patient and they are unchanged from previous note.  ALLERGIES:  is allergic to amantadines, clonazepam , lisinopril , and zoloft  [sertraline ].  MEDICATIONS:  Current Outpatient Medications  Medication Sig Dispense Refill   acetaminophen  (TYLENOL ) 500 MG tablet Take 1,000 mg by mouth every 6 (six) hours as needed for moderate pain (pain score 4-6) or mild pain (pain score 1-3).     atorvastatin  (LIPITOR) 20 MG tablet Take 20 mg by mouth at bedtime.     b complex vitamins capsule Take 1 capsule by mouth at bedtime.     Camphor-Menthol-Methyl Sal (HM SALONPAS PAIN RELIEF EX) Apply 1 patch topically daily as needed (Back pain).     Carbidopa -Levodopa  ER (SINEMET  CR) 25-100 MG tablet controlled release Take 1 tablet by mouth 3 (three) times daily with meals. (Patient taking differently: Take 1 tablet by mouth 3 (three) times daily with meals. Take one tablet by mouth three times daily at 0800, 1200, and 1730)     Cholecalciferol  1.25 MG (50000 UT) capsule Take 50,000 Units by mouth once a week. Take one capsule by  mouth every Tuesday.     clotrimazole-betamethasone (LOTRISONE) cream Apply 1 Application topically daily as needed (Rash).  feeding supplement, GLUCERNA SHAKE, (GLUCERNA SHAKE) LIQD Take 237 mLs by mouth 2 (two) times daily between meals. Alternate with ensure Diabetic     insulin  lispro (HUMALOG) 100 UNIT/ML injection Inject 0-10 Units into the skin 4 (four) times daily -  before meals and at bedtime. Sliding scale as follows;    0-200=   0 units 201-250= 2 units 251-300= 4  units 301-350= 6 units 351-400= 8 units 400= 10 units PUSH FLUIDS  and repeat CBG  in 2 hours. IF reading is still >400, notify doctor.     JANUVIA 100 MG tablet Take 100 mg by mouth daily.     lidocaine  (LIDODERM ) 5 % Place 1 patch onto the skin daily as needed. Purchase over the counter. On for 12 hours and off for 12 hours 30 patch 0   magnesium  oxide (MAG-OX) 400 (240 Mg) MG tablet Take 1 tablet (400 mg total) by mouth 2 (two) times daily.     Melatonin 5 MG CAPS Take 5 mg by mouth at bedtime.     metFORMIN  (GLUCOPHAGE ) 1000 MG tablet Take 1,000 mg by mouth 2 (two) times daily.     midodrine  (PROAMATINE ) 2.5 MG tablet Take 2.5 mg by mouth daily as needed (Cardiovascular support). Take one tablet by mouth daily as needed for SBP <120     modafinil  (PROVIGIL ) 100 MG tablet Take 100 mg by mouth daily.     NITROSTAT  0.4 MG SL tablet PLACE 1 TABLET (0.4 MG TOTAL) UNDER THE TONGUE EVERY 5 (FIVE) MINUTES AS NEEDED FOR CHEST PAIN. 25 tablet 5   polyethylene glycol (MIRALAX  / GLYCOLAX ) 17 g packet Take 17 g by mouth 2 (two) times daily. (Patient taking differently: Take 17 g by mouth daily as needed.)     QUEtiapine  (SEROQUEL ) 50 MG tablet Take 50 mg by mouth at bedtime.     rivaroxaban  (XARELTO ) 20 MG TABS tablet Take 1 tablet (20 mg total) by mouth daily. 30 tablet 1   rivastigmine  (EXELON ) 9.5 mg/24hr Place 9.5 mg onto the skin daily.     senna (SENOKOT) 8.6 MG TABS tablet Take 1 tablet (8.6 mg total) by mouth daily.     tamsulosin  (FLOMAX ) 0.4 MG CAPS capsule Take 1 capsule (0.4 mg total) by mouth at bedtime.     traZODone  (DESYREL ) 50 MG tablet Take 50 mg by mouth at bedtime.     No current facility-administered medications for this visit.    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   EXAM: CT ABDOMEN AND PELVIS WITH CONTRAST  IMPRESSION: 1. 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 2. Hepatic lesion as discussed above in the posterior RIGHT hemiliver is suspicious for metastasis in this context. For all above findings in lieu of hepatic MRI, if the patient is not able to undergo hepatic MRI, biochemical correlation and DOTATATE PET may be helpful. 3. Bowel loop in the central abdomen with nodular enhancement approximately 8-9 mm along the wall in terms of greatest wall thickness. 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 4. Hepatic steatosis. 5. Resolution of inflammation about the pancreatic tail with nodular appearance of pancreatic tail. Attention on subsequent imaging is suggested. This may reflect sequela of prior inflammation given improvement but underlying lesion in this location while not favored is not excluded.  ADDENDUM: For clarification, the central, ileal mesenteric lesion currently measuring 2.1 x 2.1 x 2.4 cm measured 1.6 x 1.8 x 2.1 cm in 2015.   Faintly visible area in the posterior RIGHT hemiliver in 2015 raising the question of possibility of small lesion at best 2 x 2.2 cm in the posterior RIGHT hemiliver this areas now clearly visible measuring 4.1 x 4.0 cm. Findings are compatible with indolent behavior. Indolent carcinoid remains the primary differential consideration. Tumor marker correlation is suggested with DOTATATE PET for further evaluation as warranted.   06/23/2022 Imaging   EXAM: MRI ABDOMEN WITHOUT AND WITH  CONTRAST  IMPRESSION: 3.9 cm heterogeneous enhancing mass in segment 7 of the right hepatic lobe, which is nonspecific. Hepatic metastasis cannot be excluded.   Small benign hemangioma and cysts in the posterior right hepatic lobe.   Small right lower quadrant mesenteric mass or lymphadenopathy, without significant change.   Stable 2.1 cm cystic lesion in the pancreatic neck, with other tiny sub-cm cystic foci in the pancreatic head and body. Differential diagnosis includes pancreatic pseudocysts and indolent IPMNs. Recommend continued follow-up by MRI in 6 months. This recommendation follows ACR consensus guidelines: Management of Incidental Pancreatic Cysts: A White Paper of the ACR Incidental Findings Committee. J Am Coll Radiol 2017;14:911-923.   7 mm calculus in the left renal pelvis, with mild left pelvicaliectasis.   07/14/2022 Initial Biopsy   FINAL MICROSCOPIC DIAGNOSIS:   A. LIVER, RIGHT, NEEDLE CORE BIOPSY:  -  Metastatic low-grade neuroendocrine tumor (positive for chromogranin and synaptophysin).  -  Proliferation rate by Ki-67 less than 3%.    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 Approximately 2.8 cm long segment of small bowel loop in the right lower quadrant, posteriorly, which exhibit asymmetric circumferential hyperattenuating mass, which may correspond to patient's known neuroendocrine tumor primary. No proximal bowel obstruction. 2. Redemonstration of a lobulated heterogeneous mass with extensive dystrophic calcifications along the mesentery measuring approximately 1.5 x 2.1 cm, which was markedly FDG avid on the prior exam and is compatible with metastatic disease. No significant interval  change in size. 3. Redemonstration of several focal liver lesions, grossly similar to the prior study. The largest lesion in the right hepatic lobe, segment 7 is compatible with patient's known metastasis. No new arterial hyperenhancing lesion seen. 4. Multiple other nonacute observations, as described above.         REVIEW OF SYSTEMS:   Constitutional: Denies fevers, chills or abnormal weight loss Eyes: Denies blurriness of vision Ears, nose, mouth, throat, and face: Denies mucositis or sore throat Respiratory: Denies cough, dyspnea or wheezes Cardiovascular: Denies palpitation, chest discomfort or lower extremity swelling Gastrointestinal:  Denies nausea, heartburn or change in bowel habits Skin: Denies abnormal skin rashes Lymphatics: Denies new lymphadenopathy or easy bruising Neurological:Denies numbness, tingling or new weaknesses Behavioral/Psych: Mood  is stable, no new changes  All other systems were reviewed with the patient and are negative.   VITALS:   Today's Vitals   06/15/24 1400 06/15/24 1425  BP:  113/66  Pulse:  61  Resp:  14  Temp:  97.6 F (36.4 C)  TempSrc:  Temporal  SpO2:  100%  Weight:  178 lb (80.7 kg)  Height:  5' 9 (1.753 m)  PainSc: 0-No pain    Body mass index is 26.29 kg/m.   Wt Readings from Last 3 Encounters:  06/15/24 178 lb (80.7 kg)  06/14/24 178 lb (80.7 kg)  05/25/24 185 lb (83.9 kg)    Body mass index is 26.29 kg/m.  Performance status (ECOG): 2 - Symptomatic, <50% confined to bed  PHYSICAL EXAM:   GENERAL:alert, no distress and comfortable.  Currently in a wheelchair. SKIN: skin color, texture, turgor are normal, no rashes or significant lesions EYES: normal, Conjunctiva are pink and non-injected, sclera clear OROPHARYNX:no exudate, no erythema and lips, buccal mucosa, and tongue normal  NECK: supple, thyroid  normal size, non-tender, without nodularity LYMPH:  no palpable lymphadenopathy in the cervical, axillary or  inguinal LUNGS: clear to auscultation and percussion with normal breathing effort HEART: regular rate & rhythm and no murmurs and no lower extremity edema ABDOMEN:abdomen soft, non-tender and normal bowel sounds Musculoskeletal:no cyanosis of digits and no clubbing  NEURO: alert & oriented x 3 with fluent speech, no focal motor/sensory deficits.  Memory deficit noted.  LABORATORY DATA:  I have reviewed the data as listed    Component Value Date/Time   NA 139 06/15/2024 1355   NA 145 07/23/2021 0000   K 4.1 06/15/2024 1355   CL 101 06/15/2024 1355   CO2 29 06/15/2024 1355   GLUCOSE 151 (H) 06/15/2024 1355   BUN 15 06/15/2024 1355   BUN 14 07/23/2021 0000   CREATININE 0.82 06/15/2024 1355   CREATININE 1.16 03/18/2016 1011   CALCIUM  9.8 06/15/2024 1355   PROT 6.9 06/15/2024 1355   ALBUMIN 3.9 06/15/2024 1355   AST 12 (L) 06/15/2024 1355   ALT <5 06/15/2024 1355   ALKPHOS 92 06/15/2024 1355   BILITOT 0.5 06/15/2024 1355   GFRNONAA >60 06/15/2024 1355   GFRAA >90 07/23/2021 0000    Lab Results  Component Value Date   WBC 10.9 (H) 06/15/2024   NEUTROABS 7.4 06/15/2024   HGB 13.2 06/15/2024   HCT 39.9 06/15/2024   MCV 86.6 06/15/2024   PLT 227 06/15/2024

## 2024-06-14 NOTE — Progress Notes (Signed)
 HPI  [name] returns today for followup over 2 years from his most recent visit. He is a pleasant 83 yo man with a h/o chronotropic incompetence, s/p PPM insertion. He has HTN and obesity. He has been diagnosed with parkinson's and is on sinemet . He has had some spells but has not injured himself. No syncope but he gets lightheaded and has fallen. He notes that he has been bothered by low bp and this has affected his ability to rehab.    Current Outpatient Medications  Medication Sig Dispense Refill   acetaminophen  (TYLENOL ) 500 MG tablet Take 1,000 mg by mouth every 6 (six) hours as needed for moderate pain (pain score 4-6) or mild pain (pain score 1-3).     atorvastatin  (LIPITOR) 20 MG tablet Take 20 mg by mouth at bedtime.     b complex vitamins capsule Take 1 capsule by mouth at bedtime.     Camphor-Menthol-Methyl Sal (HM SALONPAS PAIN RELIEF EX) Apply 1 patch topically daily as needed (Back pain).     Carbidopa -Levodopa  ER (SINEMET  CR) 25-100 MG tablet controlled release Take 1 tablet by mouth 3 (three) times daily with meals. (Patient taking differently: Take 1 tablet by mouth 3 (three) times daily with meals. Take one tablet by mouth three times daily at 0800, 1200, and 1730)     Cholecalciferol  1.25 MG (50000 UT) capsule Take 50,000 Units by mouth once a week. Take one capsule by  mouth every Tuesday.     clotrimazole-betamethasone (LOTRISONE) cream Apply 1 Application topically daily as needed (Rash).     feeding supplement, GLUCERNA SHAKE, (GLUCERNA SHAKE) LIQD Take 237 mLs by mouth 2 (two) times daily between meals. Alternate with ensure Diabetic     insulin  lispro (HUMALOG) 100 UNIT/ML injection Inject 0-10 Units into the skin 4 (four) times daily -  before meals and at bedtime. Sliding scale as follows;    0-200=   0 units 201-250= 2 units 251-300= 4 units 301-350= 6 units 351-400= 8 units 400= 10 units PUSH FLUIDS  and repeat CBG  in 2 hours. IF reading is still  >400, notify doctor.     JANUVIA 100 MG tablet Take 100 mg by mouth daily.     lidocaine  (LIDODERM ) 5 % Place 1 patch onto the skin daily as needed. Purchase over the counter. On for 12 hours and off for 12 hours 30 patch 0   magnesium  oxide (MAG-OX) 400 (240 Mg) MG tablet Take 1 tablet (400 mg total) by mouth 2 (two) times daily.     Melatonin 5 MG CAPS Take 5 mg by mouth at bedtime.     metFORMIN  (GLUCOPHAGE ) 1000 MG tablet Take 1,000 mg by mouth 2 (two) times daily.     midodrine  (PROAMATINE ) 2.5 MG tablet Take 2.5 mg by mouth daily as needed (Cardiovascular support). Take one tablet by mouth daily as needed for SBP <120     modafinil  (PROVIGIL ) 100 MG tablet Take 100 mg by mouth daily.     NITROSTAT  0.4 MG SL tablet PLACE 1 TABLET (0.4 MG TOTAL) UNDER THE TONGUE EVERY 5 (FIVE) MINUTES AS NEEDED FOR CHEST PAIN. 25 tablet 5   polyethylene glycol (MIRALAX  / GLYCOLAX ) 17 g packet Take 17 g by mouth 2 (two) times daily. (Patient taking differently: Take 17 g by mouth daily as needed.)     QUEtiapine  (SEROQUEL ) 50 MG tablet Take 50 mg by mouth at bedtime.     rivaroxaban  (XARELTO ) 20 MG  TABS tablet Take 1 tablet (20 mg total) by mouth daily. 30 tablet 1   rivastigmine  (EXELON ) 9.5 mg/24hr Place 9.5 mg onto the skin daily.     senna (SENOKOT) 8.6 MG TABS tablet Take 1 tablet (8.6 mg total) by mouth daily.     tamsulosin  (FLOMAX ) 0.4 MG CAPS capsule Take 1 capsule (0.4 mg total) by mouth at bedtime.     traZODone  (DESYREL ) 50 MG tablet Take 50 mg by mouth at bedtime.     No current facility-administered medications for this visit.     Past Medical History:  Diagnosis Date   Arthritis    R shoulder, bone spur   Arthritis    hands (03/25/2016)   Chronic diastolic heart failure (HCC)    Chronic lower back pain    Coronary heart disease    Dr Esmeralda Sharps   Dementia [facility])    Diverticulitis 12/2021   DVT (deep venous thrombosis) (HCC) 05/2022   left leg   H/O cardiovascular stress test     perhaps last one was 2009   Heart murmur    12/29/11 echo: mild MR, no AS, trivial TR   History of gout    Hyperlipidemia    Hypertension    saw Dr. HILARIO Sharps last earl;y- 2014, cardiac cath. last done ?2009, blocks seen didn't require any intervention at that point.   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 Slight right sided weakness   Type II diabetes mellitus (HCC)     ROS:   All systems reviewed and negative except as noted in the HPI.   Past Surgical History:  Procedure Laterality Date   CARDIAC CATHETERIZATION  2009?   CYSTOSCOPY W/ URETERAL STENT PLACEMENT Left 05/13/2024   Procedure: CYSTOSCOPY WITH URETERAL STENT INSERTION;  Surgeon: Francisca Redell BROCKS, MD;  Location: [facility] OR;  Service: Urology;  Laterality: Left;   EP IMPLANTABLE DEVICE N/A 03/25/2016   Procedure: Pacemaker Implant - Dual Chamber;  Surgeon: Danelle LELON Birmingham, MD;  Location: [facility] INVASIVE CV LAB;  Service: Cardiovascular;  Laterality: N/A;   FRACTURE SURGERY     HIP FRACTURE SURGERY     INSERT / REPLACE / REMOVE PACEMAKER  03/24/2016   INTRAMEDULLARY (IM) NAIL INTERTROCHANTERIC Left 02/08/2022   Procedure: INTRAMEDULLARY (IM) NAIL INTERTROCHANTRIC;  Surgeon: Addie Cordella Hamilton, MD;  Location: [facility] OR;  Service: Orthopedics;  Laterality: Left;   IR KYPHO LUMBAR INC FX REDUCE BONE BX UNI/BIL CANNULATION INC/IMAGING  12/29/2021   IR RADIOLOGIST EVAL & MGMT  12/15/2021   IR RADIOLOGY PERIPHERAL GUIDED IV START  07/14/2022   IR US  GUIDE VASC ACCESS RIGHT  07/15/2022   LEFT AND RIGHT HEART CATHETERIZATION WITH CORONARY ANGIOGRAM N/A 06/05/2014   Procedure: LEFT AND RIGHT HEART CATHETERIZATION WITH CORONARY ANGIOGRAM;  Surgeon: Victory LELON Sharps DOUGLAS, MD;  Location: [facility] CATH LAB;  Service: Cardiovascular;  Laterality: N/A;   NASAL FRACTURE SURGERY     SHOULDER ARTHROSCOPY WITH ROTATOR CUFF REPAIR AND SUBACROMIAL DECOMPRESSION Right 08/16/2013   Procedure: SHOULDER ARTHROSCOPY WITH ROTATOR CUFF REPAIR AND SUBACROMIAL DECOMPRESSION;  Surgeon: Eva Elsie Herring, MD;  Location: [facility] OR;  Service: Orthopedics;  Laterality: Right;  Right shoulder arthroscopy rotator cuff repair, subacromial decompression.   TONSILLECTOMY       Family History  Problem Relation Age of Onset   Sleep apnea Sister    Colon cancer Sister      Social History   Socioeconomic History   Marital status: Married    Spouse name: Burnard   Number of children: Not on file   Years of education: Not on file   Highest education level: Not on file  Occupational History   Occupation: [employer]   Occupation: [place] [employer]  Tobacco Use   Smoking status: Former    Current packs/day: 0.00    Average packs/day: 2.5 packs/day for 6.0 years (15.0 ttl pk-yrs)    Types: Cigarettes    Start date: 06/04/1961    Quit date: 06/05/1967    Years since quitting: 57.0   Smokeless tobacco: Never  Vaping Use   Vaping status: Never Used  Substance and Sexual Activity   Alcohol  use: Yes    Comment: rare   Drug use: No   Sexual activity: Not Currently  Other Topics Concern   Not on file  Social History Narrative   Lives with wife and son   Right handed   Drinks 1 cups caffeine daily   Social Drivers of Health   Financial Resource Strain: Not on file  Food Insecurity: No Food Insecurity (05/13/2024)   Hunger Vital Sign    Worried About Running Out of Food in the Last Year: Never true    Ran Out of Food in the Last Year: Never true  Transportation Needs: No Transportation Needs (05/13/2024)   PRAPARE - Administrator, Civil Service (Medical): No    Lack of Transportation (Non-Medical): No  Physical Activity: Not on file  Stress: Not on file   Social Connections: Unknown (05/13/2024)   Social Connection and Isolation Panel    Frequency of Communication with Friends and Family: Three times a week    Frequency of Social Gatherings with Friends and Family: Three times a week    Attends Religious Services: More than 4 times per year    Active Member of Clubs or Organizations: Yes    Attends Banker Meetings: Not on file    Marital Status: Not on file  Intimate Partner Violence: Not At Risk (05/13/2024)   Humiliation, Afraid, Rape, and Kick questionnaire    Fear of Current or Ex-Partner: No    Emotionally Abused: No    Physically Abused: No    Sexually Abused: No     BP 100/73   Pulse 67   Resp 16   Ht 5' 9 (1.753 m)   Wt 178 lb (80.7 kg)   SpO2 94%   BMI 26.29 kg/m   Physical Exam:  stable appearing NAD HEENT: Unremarkable Neck:  No JVD, no thyromegally Lymphatics:  No adenopathy Back:  No CVA tenderness Lungs:  Clear with no wheezes HEART:  Regular rate rhythm, no murmurs, no rubs, no clicks Abd:  soft, positive bowel sounds, no organomegally, no rebound, no guarding Ext:  2 plus pulses, no edema, no cyanosis, no clubbing Skin:  No rashes no nodules Neuro:  CN II through XII intact, motor grossly intact  DEVICE  Normal device function.  See PaceArt for details.   Assess/Plan:  1. Sinus node dysfunction - he is better, s/p PPM insertion. 2. PPM - his Biotronik DDD PM is working normally.  3. HTN - his blood pressure is well controlled. No change in meds. 4. Hypotension - he will take as needed midodrine , especially prior to therapy.   Danelle Waddell HERO.D.

## 2024-06-14 NOTE — Patient Instructions (Signed)
 Medication Instructions:  Your physician recommends that you continue on your current medications as directed. Please refer to the Current Medication list given to you today.  *If you need a refill on your cardiac medications before your next appointment, please call your pharmacy*  Take midodrine 2.5mg  1 hour before therapy  Lab Work: None ordered.  You may go to any [facility] Location for your lab work:  [facility] - [address] ([facility]) - [address] [phone] [address]  [place] - [address]   [place]  - [address]   [place] - [address] - [address] [employer]  - [address] ([facility]   If you have labs (blood work) drawn today and your tests are completely normal, you will receive your results only by: [employer] (if you have MyChart)  If you have any lab test that is abnormal or we need to change your treatment, we will call you or send a MyChart message to review the results.  Testing/Procedures: None ordered.  Follow-Up: At [facility], you and your health needs are our priority.  As part of our continuing mission to provide you with exceptional heart care, we have created designated Provider Care Teams.  These Care Teams include your primary Cardiologist (physician) and Advanced Practice Providers (APPs -  Physician Assistants and Nurse Practitioners) who all work together to provide you with the care you need, when you need it.  Your next appointment:   1 year(s)  The format for your next appointment:   In Person  Provider:   Donnice Primus, MD or one of the following Advanced Practice Providers on your designated Care Team:   Charlies Arthur, [place] Ozell Jodie Passey, [place] Leotis Barrack, NP  Note: Remote monitoring is used to monitor your Pacemaker/ ICD from home. This monitoring reduces  the number of office visits required to check your device to one time per year. It allows us  to keep an eye on the functioning of your device to ensure it is working properly.

## 2024-06-14 NOTE — Assessment & Plan Note (Addendum)
 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 CT AP on 06/17/22 showed: calcified mesenteric mass with suspected associated small bowel mass; suspicious right hepatic lesion; nodular enhancement along wall of bowel loop in central abdomen.  -abdomen MRI 06/23/22 showed: 3.9 cm mass in right hepatic lobe; small RLQ mesenteric mass or lymphadenopathy. 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 -PET DOTATATE from 08/12/22 showed avid disease within the liver, mesentery and small bowel. -he began Sandostatin  injections on 08/17/22. He reports worsening hyperglycemia, and decreased appetite, which are likely related to the injections.  So we stopped after last injection in 11/2022 -He is clinically stable no increased diarrhea, flushing, or abdominal cramps since he came off the injection.  Continue clinical observation. -other treatment options, such as Afinitor or Lutathera, were reviewed. If he has significant disease progression, new treatment can be initiated.   -restaging CT in 01/2024 showed stable disease  -CT CAP was done on 05/12/2024. There was stable, partially calcified mesenteric lesion. Subtle liver lesions. Resolution of previous cystic lesion in the pancreatic head.  - Plan for rescan based on symptoms and chromogranin A levels.

## 2024-06-15 ENCOUNTER — Inpatient Hospital Stay (HOSPITAL_BASED_OUTPATIENT_CLINIC_OR_DEPARTMENT_OTHER): Payer: Medicare (Managed Care) | Admitting: Nurse Practitioner

## 2024-06-15 ENCOUNTER — Inpatient Hospital Stay: Payer: Medicare (Managed Care) | Attending: Nurse Practitioner

## 2024-06-15 ENCOUNTER — Encounter: Payer: Self-pay | Admitting: Nurse Practitioner

## 2024-06-15 VITALS — BP 113/66 | HR 61 | Temp 97.6°F | Resp 14 | Ht 69.0 in | Wt 178.0 lb

## 2024-06-15 DIAGNOSIS — G20A1 Parkinson's disease without dyskinesia, without mention of fluctuations: Secondary | ICD-10-CM | POA: Diagnosis not present

## 2024-06-15 DIAGNOSIS — K5900 Constipation, unspecified: Secondary | ICD-10-CM | POA: Insufficient documentation

## 2024-06-15 DIAGNOSIS — C7A8 Other malignant neuroendocrine tumors: Secondary | ICD-10-CM | POA: Insufficient documentation

## 2024-06-15 DIAGNOSIS — D3A8 Other benign neuroendocrine tumors: Secondary | ICD-10-CM

## 2024-06-15 DIAGNOSIS — D649 Anemia, unspecified: Secondary | ICD-10-CM

## 2024-06-15 DIAGNOSIS — Z79899 Other long term (current) drug therapy: Secondary | ICD-10-CM | POA: Diagnosis not present

## 2024-06-15 DIAGNOSIS — C7B8 Other secondary neuroendocrine tumors: Secondary | ICD-10-CM

## 2024-06-15 DIAGNOSIS — D509 Iron deficiency anemia, unspecified: Secondary | ICD-10-CM | POA: Diagnosis not present

## 2024-06-15 LAB — CMP (CANCER CENTER ONLY)
ALT: <5 U/L (ref 0–44)
AST: 12 U/L — ABNORMAL LOW (ref 15–41)
Albumin: 3.9 g/dL (ref 3.5–5.0)
Alkaline Phosphatase: 92 U/L (ref 38–126)
Anion gap: 9 (ref 5–15)
BUN: 15 mg/dL (ref 8–23)
CO2: 29 mmol/L (ref 22–32)
Calcium: 9.8 mg/dL (ref 8.9–10.3)
Chloride: 101 mmol/L (ref 98–111)
Creatinine: 0.82 mg/dL (ref 0.61–1.24)
GFR, Estimated: >60 mL/min (ref >60)
Glucose, Bld: 151 mg/dL — ABNORMAL HIGH (ref 70–99)
Potassium: 4.1 mmol/L (ref 3.5–5.1)
Sodium: 139 mmol/L (ref 135–145)
Total Bilirubin: 0.5 mg/dL (ref 0.0–1.2)
Total Protein: 6.9 g/dL (ref 6.5–8.1)

## 2024-06-15 LAB — CBC WITH DIFFERENTIAL (CANCER CENTER ONLY)
Abs Immature Granulocytes: 0.03 K/uL (ref 0.00–0.07)
Basophils Absolute: 0.1 K/uL (ref 0.0–0.1)
Basophils Relative: 1 %
Eosinophils Absolute: 0.4 K/uL (ref 0.0–0.5)
Eosinophils Relative: 3 %
HCT: 39.9 % (ref 39.0–52.0)
Hemoglobin: 13.2 g/dL (ref 13.0–17.0)
Immature Granulocytes: 0 %
Lymphocytes Relative: 22 %
Lymphs Abs: 2.4 K/uL (ref 0.7–4.0)
MCH: 28.6 pg (ref 26.0–34.0)
MCHC: 33.1 g/dL (ref 30.0–36.0)
MCV: 86.6 fL (ref 80.0–100.0)
Monocytes Absolute: 0.7 K/uL (ref 0.1–1.0)
Monocytes Relative: 6 %
Neutro Abs: 7.4 K/uL (ref 1.7–7.7)
Neutrophils Relative %: 68 %
Platelet Count: 227 K/uL (ref 150–400)
RBC: 4.61 MIL/uL (ref 4.22–5.81)
RDW: 15 % (ref 11.5–15.5)
WBC Count: 10.9 K/uL — ABNORMAL HIGH (ref 4.0–10.5)
nRBC: 0 % (ref 0.0–0.2)

## 2024-06-15 LAB — RETIC PANEL
Immature Retic Fract: 12.4 % (ref 2.3–15.9)
RBC.: 4.63 MIL/uL (ref 4.22–5.81)
Retic Count, Absolute: 61.1 K/uL (ref 19.0–186.0)
Retic Ct Pct: 1.3 % (ref 0.4–3.1)
Reticulocyte Hemoglobin: 34.1 pg (ref >27.9)

## 2024-06-15 LAB — FERRITIN: Ferritin: 74 ng/mL (ref 24–336)

## 2024-06-15 LAB — IRON AND IRON BINDING CAPACITY (CC-WL,HP ONLY)
Iron: 50 ug/dL (ref 45–182)
Saturation Ratios: 16 % — ABNORMAL LOW (ref 17.9–39.5)
TIBC: 314 ug/dL (ref 250–450)
UIBC: 264 ug/dL (ref 117–376)

## 2024-06-18 LAB — CHROMOGRANIN A: Chromogranin A (ng/mL): 98.4 ng/mL (ref 0.0–101.8)

## 2024-06-20 ENCOUNTER — Ambulatory Visit: Payer: Self-pay | Admitting: Nurse Practitioner

## 2024-06-26 ENCOUNTER — Encounter: Payer: Self-pay | Admitting: Hematology

## 2024-07-04 DIAGNOSIS — G4752 REM sleep behavior disorder: Secondary | ICD-10-CM | POA: Diagnosis not present

## 2024-07-04 DIAGNOSIS — F03B2 Unspecified dementia, moderate, with psychotic disturbance: Secondary | ICD-10-CM | POA: Diagnosis not present

## 2024-07-04 DIAGNOSIS — F5102 Adjustment insomnia: Secondary | ICD-10-CM | POA: Diagnosis not present

## 2024-07-21 ENCOUNTER — Encounter: Payer: Self-pay | Admitting: Neurology

## 2024-07-24 NOTE — Telephone Encounter (Signed)
 Pt's wife is asking that the RN's of Dr Rosemarie route the my chart message that she sent on 9/20 as the situation at hand re: the reaction pt is having to the SINEMET  is a very complicated situation.  Phone rep did reach out to [place], NP to see if she would be able to see pt about this, she has suggested that pt remain with Dr Rosemarie for right now.

## 2024-07-25 NOTE — Telephone Encounter (Signed)
 Can reduce dose further per wife request to Sinemet  CR 1 tab twice daily - can schedule 8 hours apart. Please advise wife and facility.

## 2024-07-25 NOTE — Telephone Encounter (Signed)
 Dr Buck, this pt seen you on 7/25. You noted  I think it is worth cutting back on the levodopa , I would recommend cutting out the long-acting dose at bedtime and may be scaling back on the Seroquel  if you agree. Pt/wife has called in with concerns of side effects. I do not see that you actually reduced his medication or sent a new order. Please review the previous notes. Do you still agree to cut out the night time dose? Pt currently takes it at 8,12,530.

## 2024-07-25 NOTE — Telephone Encounter (Signed)
 I contacted pt spouse and facility, advised both to reduce dose further to Sinemet  CR 1 tab twice daily - can schedule 8 hours apart. Advised this has also been sent to Dr Rosemarie, he will provide further recommendations, if he has any, when he is back in office next week. Charge nurse Nat and pt spouse verbally understood and was appreciative.

## 2024-07-25 NOTE — Telephone Encounter (Signed)
 Nat From [facility] called Inregards to PT wife is concern about Pt side effects that he is getting from Medication (Carbidopa -Levodopa  ER (SINEMET  CR) 25-100 MG tablet controlled release ) and wants to Know if PT can be tapered off medication due to the side effect. PT wife is concern and Nat wants to help Pt  as well   Nat call back number is [phone]

## 2024-07-26 ENCOUNTER — Telehealth: Payer: Self-pay | Admitting: Neurology

## 2024-07-26 NOTE — Telephone Encounter (Signed)
 I called this pt to r/s appt for 12/06/24 b/c Jordan [name] will be out. The wife was extremely upset; says 'Dr. Is very much unaccessable, can never reach him for concerns regardign her husband, appts are always getting pushed out, NP can only do so much for him' and is generally very upset with how the practice is run. I listened to her concerns and rescheduled the appt for March 3, but she did want to speak with someone 'above my paygrade'

## 2024-07-30 ENCOUNTER — Ambulatory Visit (INDEPENDENT_AMBULATORY_CARE_PROVIDER_SITE_OTHER): Payer: Medicare (Managed Care)

## 2024-07-30 DIAGNOSIS — I495 Sick sinus syndrome: Secondary | ICD-10-CM

## 2024-07-30 LAB — CUP PACEART REMOTE DEVICE CHECK
Date Time Interrogation Session: 20250929111354
Implantable Lead Connection Status: 753985
Implantable Lead Connection Status: 753985
Implantable Lead Implant Date: 20170525
Implantable Lead Implant Date: 20170525
Implantable Lead Location: 753859
Implantable Lead Location: 753860
Implantable Lead Model: 377
Implantable Lead Model: 377
Implantable Lead Serial Number: 49470224
Implantable Lead Serial Number: 49485613
Implantable Pulse Generator Implant Date: 20170525
Pulse Gen Model: 394969
Pulse Gen Serial Number: 68798589

## 2024-07-31 ENCOUNTER — Ambulatory Visit: Payer: Self-pay | Admitting: Internal Medicine

## 2024-08-01 NOTE — Progress Notes (Signed)
 Remote PPM Transmission

## 2024-08-03 ENCOUNTER — Ambulatory Visit: Payer: Medicare (Managed Care) | Admitting: Physical Medicine and Rehabilitation

## 2024-08-08 ENCOUNTER — Institutional Professional Consult (permissible substitution): Payer: Medicare (Managed Care) | Admitting: Neurology

## 2024-08-08 DIAGNOSIS — Z1331 Encounter for screening for depression: Secondary | ICD-10-CM | POA: Diagnosis not present

## 2024-08-08 DIAGNOSIS — E119 Type 2 diabetes mellitus without complications: Secondary | ICD-10-CM | POA: Diagnosis not present

## 2024-08-12 NOTE — Progress Notes (Incomplete)
 Assessment: 1.  Large left renal pelvic stone.  14 mm, at last measurement in April of this year.  Asymptomatic although he does have gross hematuria.  He is on Eliquis  2.  2 left distal ureteral calculi on April CT scan.  No hydronephrosis.  He has been on tamsulosin  for 3 months.  More than likely, based on size, he has passed these  3.  Gross hematuria, most likely related to problem #1  4.  BPH based on size criteria  5.  Most recently admitted for urosepsis with left ureteral calculi, stented.  Urine appears clear today   Plan: 1.  His urine was cultured.  I would plan on keeping him on suppression for now  2.  I did discuss management of his situation-leaving stent in for now, no planned intervention versus eventual shockwave lithotripsy versus ureteroscopic management with laser.  With his overall grave medical condition, I do not think any anesthetic procedure is recommended at this point.  3.  Start cephalexin  250 mg p.o. nightly for antibiotic suppression, continue until I given order to stop  4.  Office visit in 3 months.  On the way up to this floor we will get a KUB    History of Present Illness:  Initially seen June 30 of this year for evaluation of a left renal calculus. . He does have a history of gross hematuria.  He was admitted to the hospital in March for failure to thrive with advanced Parkinson's disease with cognitive deficit.  The patient is in a skilled care facility at this time, at [name].  He is wheelchair confined.  He has been followed at [facility] for his left renal calculi as well as BPH and hematuria.  He currently has no complaints of left-sided flank pain.  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  CT scan from April 3 of this year also revealed 2 small left lower ureteral stones.  There was no evident hydronephrosis.  He passes most of his urine in a diaper.  He also has some fecal incontinence.  7.13.2025: Underwent urgent  placement of left ureteral stent by Dr. Francisca for obstructing left distal ureteral stone as well as UTI/sepsis. Culture + for klebsiella res to macrobid/ampicillin  7.23.2025: Here today for follow-up.  Prior to his admission on the 12th of last month, he did have cognitive decline, perhaps at this point felt to be due to underlying urinary tract infection.   Past Medical History:  Past Medical History:  Diagnosis Date   Arthritis    R shoulder, bone spur   Arthritis    hands (03/25/2016)   Chronic diastolic heart failure (HCC)    Chronic lower back pain    Coronary heart disease    Dr Esmeralda Sharps   Dementia [facility])    Diverticulitis 12/2021   DVT (deep venous thrombosis) (HCC) 05/2022   left leg   H/O cardiovascular stress test    perhaps last one was 2009   Heart murmur    12/29/11 echo: mild MR, no AS, trivial TR   History of gout    Hyperlipidemia    Hypertension    saw Dr. HILARIO Sharps last earl;y- 2014, cardiac cath. last done ?2009, blocks seen didn't require any intervention at that point.   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 Slight right sided weakness   Type II diabetes mellitus [facility])     Past Surgical History:  Past Surgical History:  Procedure Laterality Date   CARDIAC CATHETERIZATION  2009?   CYSTOSCOPY W/ URETERAL STENT PLACEMENT Left 05/13/2024   Procedure: CYSTOSCOPY WITH URETERAL STENT INSERTION;  Surgeon: Francisca Redell BROCKS, MD;  Location: [facility] OR;  Service: Urology;  Laterality: Left;   EP IMPLANTABLE DEVICE N/A 03/25/2016   Procedure: Pacemaker Implant - Dual Chamber;  Surgeon: Danelle LELON Birmingham, MD;  Location: [facility] INVASIVE CV LAB;  Service: Cardiovascular;  Laterality: N/A;    FRACTURE SURGERY     HIP FRACTURE SURGERY     INSERT / REPLACE / REMOVE PACEMAKER  03/24/2016   INTRAMEDULLARY (IM) NAIL INTERTROCHANTERIC Left 02/08/2022   Procedure: INTRAMEDULLARY (IM) NAIL INTERTROCHANTRIC;  Surgeon: Addie Cordella Hamilton, MD;  Location: [facility] OR;  Service: Orthopedics;  Laterality: Left;   IR KYPHO LUMBAR INC FX REDUCE BONE BX UNI/BIL CANNULATION INC/IMAGING  12/29/2021   IR RADIOLOGIST EVAL & MGMT  12/15/2021   IR RADIOLOGY PERIPHERAL GUIDED IV START  07/14/2022   IR US  GUIDE VASC ACCESS RIGHT  07/15/2022   LEFT AND RIGHT HEART CATHETERIZATION WITH CORONARY ANGIOGRAM N/A 06/05/2014   Procedure: LEFT AND RIGHT HEART CATHETERIZATION WITH CORONARY ANGIOGRAM;  Surgeon: Victory LELON Claudene DOUGLAS, MD;  Location: [facility] CATH LAB;  Service: Cardiovascular;  Laterality: N/A;   NASAL FRACTURE SURGERY     SHOULDER ARTHROSCOPY WITH ROTATOR CUFF REPAIR AND SUBACROMIAL DECOMPRESSION Right 08/16/2013   Procedure: SHOULDER ARTHROSCOPY WITH ROTATOR CUFF REPAIR AND SUBACROMIAL DECOMPRESSION;  Surgeon: Eva Elsie Herring, MD;  Location: [facility] OR;  Service: Orthopedics;  Laterality: Right;  Right shoulder arthroscopy rotator cuff repair, subacromial decompression.   TONSILLECTOMY      Allergies:  Allergies  Allergen Reactions   Amantadines Other (See Comments)    Mental changes   Clonazepam  Other (See Comments)    Mental changes   Lisinopril  Other (See Comments)    Unknown reaction   Zoloft  [Sertraline ] Other (See Comments)    Mental changes    Family History:  Family History  Problem Relation Age of Onset   Sleep apnea Sister    Colon cancer Sister     Social History:  Social History   Tobacco Use   Smoking status: Former    Current packs/day: 0.00    Average packs/day: 2.5 packs/day for 6.0 years (15.0 ttl pk-yrs)    Types: Cigarettes    Start date: 06/04/1961    Quit date: 06/05/1967    Years since quitting: 57.2   Smokeless tobacco: Never  Vaping Use   Vaping status: Never Used   Substance Use Topics   Alcohol  use: Yes    Comment: rare   Drug use: No    Review of symptoms:  Constitutional:  Negative for unexplained weight loss, night sweats, fever, chills ENT:  Negative for nose bleeds, sinus pain, painful swallowing CV:  Negative for chest pain, shortness of breath, exercise intolerance, palpitations, loss of consciousness Resp:  Negative for cough, wheezing, shortness of breath GI:  Negative for nausea, vomiting, diarrhea, bloody stools GU:  Positives noted in HPI; otherwise negative for gross hematuria, dysuria, urinary incontinence Neuro:  Negative for seizures, poor balance, limb  weakness, slurred speech Psych:  Negative for lack of energy, depression, anxiety Endocrine:  Negative for polydipsia, polyuria, symptoms of hypoglycemia (dizziness, hunger, sweating) Hematologic:  Negative for anemia, purpura, petechia, prolonged or excessive bleeding, use of anticoagulants  Allergic:  Negative for difficulty breathing or choking as a result of exposure to anything; no shellfish allergy; no allergic response (rash/itch) to materials, foods  Physical exam: There were no vitals taken for this visit. GENERAL APPEARANCE: Somewhat lethargic, in a wheelchair HEENT: Atraumatic, Normocephalic. NECK: Normal appearance LUNGS: Normal inspiratory and expiratory excursion HEART: Regular Rate EXTREMITIES: Moves all extremities well.  Without clubbing, cyanosis, or edema. NEUROLOGIC:  Alert and oriented x 3, normal gait, CN II-XII grossly intact.  MENTAL STATUS:  Appropriate. SKIN:  Warm, dry and intact.    Results:   I have reviewed prior imaging--CT images from most recent hospitalization.  Skin to stone distance 8 cm, Hounsfield units 850.  Prostate volume measurement approximately 50 mL  I have reviewed [facility] records as well as most recent [place] records  Recent urine culture reviewed  I/O catheterization was performed to collect urine

## 2024-08-13 ENCOUNTER — Ambulatory Visit: Payer: Medicare (Managed Care) | Admitting: Urology

## 2024-08-13 ENCOUNTER — Other Ambulatory Visit: Payer: Self-pay

## 2024-08-13 DIAGNOSIS — N400 Enlarged prostate without lower urinary tract symptoms: Secondary | ICD-10-CM

## 2024-08-13 DIAGNOSIS — A4150 Gram-negative sepsis, unspecified: Secondary | ICD-10-CM

## 2024-08-13 DIAGNOSIS — N200 Calculus of kidney: Secondary | ICD-10-CM

## 2024-08-13 DIAGNOSIS — N201 Calculus of ureter: Secondary | ICD-10-CM

## 2024-08-13 DIAGNOSIS — R310 Gross hematuria: Secondary | ICD-10-CM

## 2024-08-22 ENCOUNTER — Ambulatory Visit: Payer: Medicare (Managed Care) | Admitting: Urology

## 2024-08-22 NOTE — Progress Notes (Incomplete)
 Assessment: 1.  Large left renal pelvic stone.  14 mm, at last measurement in April of this year.  Asymptomatic although he does have gross hematuria.  He is on Eliquis  2.  2 left distal ureteral calculi on April CT scan.  No hydronephrosis.  He has been on tamsulosin  for 3 months.  More than likely, based on size, he has passed these  3.  Gross hematuria, most likely related to problem #1  4.  BPH based on size criteria  5.  Most recently admitted for urosepsis with left ureteral calculi, stented.  Urine appears clear today   Plan:    History of Present Illness:  Initially seen June 30 of this year for evaluation of a left renal calculus. . He does have a history of gross hematuria.  He was admitted to the hospital in March for failure to thrive with advanced Parkinson's disease with cognitive deficit.  The patient is in a skilled care facility at this time, at [name].  He is wheelchair confined.  He has been followed at [facility] for his left renal calculi as well as BPH and hematuria.  He currently has no complaints of left-sided flank pain.  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  CT scan from April 3 of this year also revealed 2 small left lower ureteral stones.  There was no evident hydronephrosis.  He passes most of his urine in a diaper.  He also has some fecal incontinence.  7.13.2025: Underwent urgent placement of left ureteral stent by Dr. Francisca for obstructing left distal ureteral stone as well as UTI/sepsis. Culture + for klebsiella res to macrobid/ampicillin  7.23.2025: Here today for follow-up.  Prior to his admission on the 12th of last month, he did have cognitive decline, perhaps at this point felt to be due to underlying urinary tract infection.  10.22.2025:  Past Medical History:  Past Medical History:  Diagnosis Date   Arthritis    R shoulder, bone spur   Arthritis    hands (03/25/2016)   Chronic diastolic heart failure (HCC)     Chronic lower back pain    Coronary heart disease    Dr Esmeralda Sharps   [facility])    Diverticulitis 12/2021   DVT (deep venous thrombosis) (HCC) 05/2022   left leg   H/O cardiovascular stress test    perhaps last one was 2009   Heart murmur    12/29/11 echo: mild MR, no AS, trivial TR   History of gout    Hyperlipidemia    Hypertension    saw Dr. HILARIO Sharps last earl;y- 2014, cardiac cath. last done ?2009, blocks seen didn't require any intervention at that point.   Narcolepsy    [name] 04-11-97; Mean Latency 1.25min, SOREM 2   OSA on CPAP    NPSG 12-13-98 AHI 22.7   Parkinson's disease (HCC)    Vascular   PONV (postoperative nausea and vomiting)    Presence of permanent cardiac pacemaker    REM sleep behavior disorder    Shortness of breath    with exertion    Stroke (HCC)    2021. Slight right sided weakness   Type II diabetes mellitus [facility])     Past Surgical History:  Past Surgical History:  Procedure Laterality Date   CARDIAC CATHETERIZATION  2009?   CYSTOSCOPY W/ URETERAL STENT PLACEMENT Left 05/13/2024   Procedure: CYSTOSCOPY WITH URETERAL STENT INSERTION;  Surgeon: Francisca Redell BROCKS, MD;  Location: [facility] OR;  Service: Urology;  Laterality: Left;   EP IMPLANTABLE DEVICE N/A 03/25/2016   Procedure: Pacemaker Implant - Dual Chamber;  Surgeon: Danelle LELON Birmingham, MD;  Location: [facility] INVASIVE CV LAB;  Service: Cardiovascular;  Laterality: N/A;   FRACTURE SURGERY     HIP FRACTURE SURGERY     INSERT / REPLACE / REMOVE PACEMAKER  03/24/2016   INTRAMEDULLARY (IM) NAIL INTERTROCHANTERIC Left 02/08/2022   Procedure: INTRAMEDULLARY (IM) NAIL INTERTROCHANTRIC;  Surgeon: Addie Cordella Hamilton, MD;  Location: [facility] OR;  Service: Orthopedics;  Laterality: Left;   IR KYPHO LUMBAR INC FX REDUCE BONE BX UNI/BIL CANNULATION INC/IMAGING  12/29/2021   IR RADIOLOGIST EVAL & MGMT  12/15/2021   IR RADIOLOGY PERIPHERAL GUIDED IV START  07/14/2022   IR US  GUIDE VASC ACCESS RIGHT  07/15/2022   LEFT AND RIGHT  HEART CATHETERIZATION WITH CORONARY ANGIOGRAM N/A 06/05/2014   Procedure: LEFT AND RIGHT HEART CATHETERIZATION WITH CORONARY ANGIOGRAM;  Surgeon: Victory LELON Claudene DOUGLAS, MD;  Location: [facility] CATH LAB;  Service: Cardiovascular;  Laterality: N/A;   NASAL FRACTURE SURGERY     SHOULDER ARTHROSCOPY WITH ROTATOR CUFF REPAIR AND SUBACROMIAL DECOMPRESSION Right 08/16/2013   Procedure: SHOULDER ARTHROSCOPY WITH ROTATOR CUFF REPAIR AND SUBACROMIAL DECOMPRESSION;  Surgeon: Eva Elsie Herring, MD;  Location: [facility] OR;  Service: Orthopedics;  Laterality: Right;  Right shoulder arthroscopy rotator cuff repair, subacromial decompression.   TONSILLECTOMY      Allergies:  Allergies  Allergen Reactions   Amantadines Other (See Comments)    Mental changes   Clonazepam  Other (See Comments)    Mental changes   Lisinopril  Other (See Comments)    Unknown reaction   Zoloft  [Sertraline ] Other (See Comments)    Mental changes    Family History:  Family History  Problem Relation Age of Onset   Sleep apnea Sister    Colon cancer Sister     Social History:  Social History   Tobacco Use   Smoking status: Former    Current packs/day: 0.00    Average packs/day: 2.5 packs/day for 6.0 years (15.0 ttl pk-yrs)    Types: Cigarettes    Start date: 06/04/1961    Quit date: 06/05/1967    Years since quitting: 57.2   Smokeless tobacco: Never  Vaping Use   Vaping status: Never Used  Substance Use Topics   Alcohol  use: Yes    Comment: rare   Drug use: No    Review of symptoms:  Constitutional:  Negative for unexplained weight loss, night sweats, fever, chills ENT:  Negative for nose bleeds, sinus pain, painful swallowing CV:  Negative for chest pain, shortness of breath, exercise intolerance, palpitations, loss of consciousness Resp:  Negative for cough, wheezing, shortness of breath GI:  Negative for nausea, vomiting, diarrhea, bloody stools GU:  Positives noted in HPI; otherwise negative for gross hematuria,  dysuria, urinary incontinence Neuro:  Negative for seizures, poor balance, limb weakness, slurred speech Psych:  Negative for lack of energy, depression, anxiety Endocrine:  Negative for polydipsia, polyuria, symptoms of hypoglycemia (dizziness, hunger, sweating) Hematologic:  Negative for anemia, purpura, petechia, prolonged or excessive bleeding, use of anticoagulants  Allergic:  Negative for difficulty breathing or choking as a result of exposure to anything; no shellfish allergy; no allergic response (rash/itch) to materials, foods  Physical exam: There were no vitals taken for this visit. GENERAL APPEARANCE: Somewhat lethargic, in a wheelchair HEENT: Atraumatic, Normocephalic. NECK: Normal appearance LUNGS: Normal inspiratory and expiratory excursion HEART: Regular Rate EXTREMITIES: Moves  all extremities well.  Without clubbing, cyanosis, or edema. NEUROLOGIC:  Alert and oriented x 3, normal gait, CN II-XII grossly intact.  MENTAL STATUS:  Appropriate. SKIN:  Warm, dry and intact.    Results:   I have reviewed prior imaging--CT images from most recent hospitalization.  Skin to stone distance 8 cm, Hounsfield units 850.  Prostate volume measurement approximately 50 mL  I have reviewed [facility] records as well as most recent [facility] records  Recent urine culture reviewed  I/O catheterization was performed to collect urine

## 2024-08-24 DIAGNOSIS — G4733 Obstructive sleep apnea (adult) (pediatric): Secondary | ICD-10-CM | POA: Diagnosis not present

## 2024-08-28 ENCOUNTER — Encounter: Payer: Medicare (Managed Care) | Admitting: Physical Medicine and Rehabilitation

## 2024-08-28 NOTE — Progress Notes (Unsigned)
 Assessment: 1.  Large left renal pelvic stone.  14 mm, at last measurement in April of this year.  Asymptomatic although he does have gross hematuria.  He is on Eliquis  2.  2 left distal ureteral calculi on April CT scan.  No hydronephrosis.  He has been on tamsulosin  for 3 months.  More than likely, based on size, he has passed these  3.  Gross hematuria, most likely related to problem #1  4.  BPH based on size criteria  5.  Most recently admitted for urosepsis with left ureteral calculi, stented.  Urine appears clear today   Plan:    History of Present Illness:  Initially seen June 30 of this year for evaluation of a left renal calculus. . He does have a history of gross hematuria.  He was admitted to the hospital in March for failure to thrive with advanced Parkinson's disease with cognitive deficit.  The patient is in a skilled care facility at this time, at [name].  He is wheelchair confined.  He has been followed at [facility] for his left renal calculi as well as BPH and hematuria.  He currently has no complaints of left-sided flank pain.  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  CT scan from April 3 of this year also revealed 2 small left lower ureteral stones.  There was no evident hydronephrosis.  He passes most of his urine in a diaper.  He also has some fecal incontinence.  7.13.2025: Underwent urgent placement of left ureteral stent by Dr. Francisca for obstructing left distal ureteral stone as well as UTI/sepsis. Culture + for klebsiella res to macrobid/ampicillin  7.23.2025: Here today for follow-up.  Prior to his admission on the 12th of last month, he did have cognitive decline, perhaps at this point felt to be due to underlying urinary tract infection.  10.22.2025:  Past Medical History:  Past Medical History:  Diagnosis Date   Arthritis    R shoulder, bone spur   Arthritis    hands (03/25/2016)   Chronic diastolic heart failure (HCC)     Chronic lower back pain    Coronary heart disease    Dr Esmeralda Sharps   [facility])    Diverticulitis 12/2021   DVT (deep venous thrombosis) (HCC) 05/2022   left leg   H/O cardiovascular stress test    perhaps last one was 2009   Heart murmur    12/29/11 echo: mild MR, no AS, trivial TR   History of gout    Hyperlipidemia    Hypertension    saw Dr. HILARIO Sharps last earl;y- 2014, cardiac cath. last done ?2009, blocks seen didn't require any intervention at that point.   Narcolepsy    [name] 04-11-97; Mean Latency 1.25min, SOREM 2   OSA on CPAP    NPSG 12-13-98 AHI 22.7   Parkinson's disease (HCC)    Vascular   PONV (postoperative nausea and vomiting)    Presence of permanent cardiac pacemaker    REM sleep behavior disorder    Shortness of breath    with exertion    Stroke (HCC)    2021. Slight right sided weakness   Type II diabetes mellitus [facility])     Past Surgical History:  Past Surgical History:  Procedure Laterality Date   CARDIAC CATHETERIZATION  2009?   CYSTOSCOPY W/ URETERAL STENT PLACEMENT Left 05/13/2024   Procedure: CYSTOSCOPY WITH URETERAL STENT INSERTION;  Surgeon: Francisca Redell BROCKS, MD;  Location: [facility] OR;  Service: Urology;  Laterality: Left;   EP IMPLANTABLE DEVICE N/A 03/25/2016   Procedure: Pacemaker Implant - Dual Chamber;  Surgeon: Danelle LELON Birmingham, MD;  Location: [facility] INVASIVE CV LAB;  Service: Cardiovascular;  Laterality: N/A;   FRACTURE SURGERY     HIP FRACTURE SURGERY     INSERT / REPLACE / REMOVE PACEMAKER  03/24/2016   INTRAMEDULLARY (IM) NAIL INTERTROCHANTERIC Left 02/08/2022   Procedure: INTRAMEDULLARY (IM) NAIL INTERTROCHANTRIC;  Surgeon: Addie Cordella Hamilton, MD;  Location: [facility] OR;  Service: Orthopedics;  Laterality: Left;   IR KYPHO LUMBAR INC FX REDUCE BONE BX UNI/BIL CANNULATION INC/IMAGING  12/29/2021   IR RADIOLOGIST EVAL & MGMT  12/15/2021   IR RADIOLOGY PERIPHERAL GUIDED IV START  07/14/2022   IR US  GUIDE VASC ACCESS RIGHT  07/15/2022   LEFT AND RIGHT  HEART CATHETERIZATION WITH CORONARY ANGIOGRAM N/A 06/05/2014   Procedure: LEFT AND RIGHT HEART CATHETERIZATION WITH CORONARY ANGIOGRAM;  Surgeon: Victory LELON Claudene DOUGLAS, MD;  Location: [facility] CATH LAB;  Service: Cardiovascular;  Laterality: N/A;   NASAL FRACTURE SURGERY     SHOULDER ARTHROSCOPY WITH ROTATOR CUFF REPAIR AND SUBACROMIAL DECOMPRESSION Right 08/16/2013   Procedure: SHOULDER ARTHROSCOPY WITH ROTATOR CUFF REPAIR AND SUBACROMIAL DECOMPRESSION;  Surgeon: Eva Elsie Herring, MD;  Location: [facility] OR;  Service: Orthopedics;  Laterality: Right;  Right shoulder arthroscopy rotator cuff repair, subacromial decompression.   TONSILLECTOMY      Allergies:  Allergies  Allergen Reactions   Amantadines Other (See Comments)    Mental changes   Clonazepam  Other (See Comments)    Mental changes   Lisinopril  Other (See Comments)    Unknown reaction   Zoloft  [Sertraline ] Other (See Comments)    Mental changes    Family History:  Family History  Problem Relation Age of Onset   Sleep apnea Sister    Colon cancer Sister     Social History:  Social History   Tobacco Use   Smoking status: Former    Current packs/day: 0.00    Average packs/day: 2.5 packs/day for 6.0 years (15.0 ttl pk-yrs)    Types: Cigarettes    Start date: 06/04/1961    Quit date: 06/05/1967    Years since quitting: 57.2   Smokeless tobacco: Never  Vaping Use   Vaping status: Never Used  Substance Use Topics   Alcohol  use: Yes    Comment: rare   Drug use: No    Review of symptoms:  Constitutional:  Negative for unexplained weight loss, night sweats, fever, chills ENT:  Negative for nose bleeds, sinus pain, painful swallowing CV:  Negative for chest pain, shortness of breath, exercise intolerance, palpitations, loss of consciousness Resp:  Negative for cough, wheezing, shortness of breath GI:  Negative for nausea, vomiting, diarrhea, bloody stools GU:  Positives noted in HPI; otherwise negative for gross hematuria,  dysuria, urinary incontinence Neuro:  Negative for seizures, poor balance, limb weakness, slurred speech Psych:  Negative for lack of energy, depression, anxiety Endocrine:  Negative for polydipsia, polyuria, symptoms of hypoglycemia (dizziness, hunger, sweating) Hematologic:  Negative for anemia, purpura, petechia, prolonged or excessive bleeding, use of anticoagulants  Allergic:  Negative for difficulty breathing or choking as a result of exposure to anything; no shellfish allergy; no allergic response (rash/itch) to materials, foods  Physical exam: There were no vitals taken for this visit. GENERAL APPEARANCE: Somewhat lethargic, in a wheelchair HEENT: Atraumatic, Normocephalic. NECK: Normal appearance LUNGS: Normal inspiratory and expiratory excursion HEART: Regular Rate EXTREMITIES: Moves  all extremities well.  Without clubbing, cyanosis, or edema. NEUROLOGIC:  Alert and oriented x 3, normal gait, CN II-XII grossly intact.  MENTAL STATUS:  Appropriate. SKIN:  Warm, dry and intact.    Results:   I have reviewed prior imaging--CT images from most recent hospitalization.  Skin to stone distance 8 cm, Hounsfield units 850.  Prostate volume measurement approximately 50 mL  I have reviewed [facility] records as well as most recent [facility] records  Recent urine culture reviewed  I/O catheterization was performed to collect urine

## 2024-08-29 ENCOUNTER — Ambulatory Visit: Payer: Medicare (Managed Care) | Admitting: Urology

## 2024-08-29 DIAGNOSIS — N200 Calculus of kidney: Secondary | ICD-10-CM

## 2024-08-29 DIAGNOSIS — N1330 Unspecified hydronephrosis: Secondary | ICD-10-CM

## 2024-08-29 DIAGNOSIS — N201 Calculus of ureter: Secondary | ICD-10-CM

## 2024-08-29 DIAGNOSIS — N400 Enlarged prostate without lower urinary tract symptoms: Secondary | ICD-10-CM

## 2024-08-30 ENCOUNTER — Encounter: Payer: Self-pay | Admitting: Hematology

## 2024-08-31 DIAGNOSIS — R790 Abnormal level of blood mineral: Secondary | ICD-10-CM | POA: Diagnosis not present

## 2024-08-31 DIAGNOSIS — R3989 Other symptoms and signs involving the genitourinary system: Secondary | ICD-10-CM | POA: Diagnosis not present

## 2024-08-31 DIAGNOSIS — G20A1 Parkinson's disease without dyskinesia, without mention of fluctuations: Secondary | ICD-10-CM | POA: Diagnosis not present

## 2024-09-04 ENCOUNTER — Other Ambulatory Visit: Payer: Self-pay

## 2024-09-04 ENCOUNTER — Inpatient Hospital Stay: Payer: Medicare (Managed Care) | Admitting: Hematology

## 2024-09-04 ENCOUNTER — Inpatient Hospital Stay: Payer: Medicare (Managed Care) | Attending: Nurse Practitioner

## 2024-09-04 VITALS — BP 104/62 | HR 64 | Temp 97.6°F | Resp 14 | Ht 69.0 in

## 2024-09-04 DIAGNOSIS — R319 Hematuria, unspecified: Secondary | ICD-10-CM | POA: Insufficient documentation

## 2024-09-04 DIAGNOSIS — N200 Calculus of kidney: Secondary | ICD-10-CM | POA: Insufficient documentation

## 2024-09-04 DIAGNOSIS — K769 Liver disease, unspecified: Secondary | ICD-10-CM

## 2024-09-04 DIAGNOSIS — E611 Iron deficiency: Secondary | ICD-10-CM | POA: Insufficient documentation

## 2024-09-04 DIAGNOSIS — C7B8 Other secondary neuroendocrine tumors: Secondary | ICD-10-CM

## 2024-09-04 DIAGNOSIS — Z79899 Other long term (current) drug therapy: Secondary | ICD-10-CM | POA: Diagnosis not present

## 2024-09-04 DIAGNOSIS — D649 Anemia, unspecified: Secondary | ICD-10-CM

## 2024-09-04 DIAGNOSIS — D3A8 Other benign neuroendocrine tumors: Secondary | ICD-10-CM

## 2024-09-04 DIAGNOSIS — G20A1 Parkinson's disease without dyskinesia, without mention of fluctuations: Secondary | ICD-10-CM | POA: Diagnosis not present

## 2024-09-04 DIAGNOSIS — C7A8 Other malignant neuroendocrine tumors: Secondary | ICD-10-CM | POA: Diagnosis not present

## 2024-09-04 LAB — RETIC PANEL
Immature Retic Fract: 24.1 % — ABNORMAL HIGH (ref 2.3–15.9)
RBC.: 4.22 MIL/uL (ref 4.22–5.81)
Retic Count, Absolute: 98.3 K/uL (ref 19.0–186.0)
Retic Ct Pct: 2.3 % (ref 0.4–3.1)
Reticulocyte Hemoglobin: 33.6 pg (ref >27.9)

## 2024-09-04 LAB — FERRITIN: Ferritin: 201 ng/mL (ref 24–336)

## 2024-09-04 LAB — CBC WITH DIFFERENTIAL (CANCER CENTER ONLY)
Abs Immature Granulocytes: 0.02 K/uL (ref 0.00–0.07)
Basophils Absolute: 0.1 K/uL (ref 0.0–0.1)
Basophils Relative: 1 %
Eosinophils Absolute: 0.4 K/uL (ref 0.0–0.5)
Eosinophils Relative: 5 %
HCT: 37.2 % — ABNORMAL LOW (ref 39.0–52.0)
Hemoglobin: 11.9 g/dL — ABNORMAL LOW (ref 13.0–17.0)
Immature Granulocytes: 0 %
Lymphocytes Relative: 28 %
Lymphs Abs: 2.7 K/uL (ref 0.7–4.0)
MCH: 27.5 pg (ref 26.0–34.0)
MCHC: 32 g/dL (ref 30.0–36.0)
MCV: 86.1 fL (ref 80.0–100.0)
Monocytes Absolute: 0.7 K/uL (ref 0.1–1.0)
Monocytes Relative: 7 %
Neutro Abs: 5.6 K/uL (ref 1.7–7.7)
Neutrophils Relative %: 59 %
Platelet Count: 256 K/uL (ref 150–400)
RBC: 4.32 MIL/uL (ref 4.22–5.81)
RDW: 15 % (ref 11.5–15.5)
WBC Count: 9.5 K/uL (ref 4.0–10.5)
nRBC: 0 % (ref 0.0–0.2)

## 2024-09-04 LAB — VITAMIN B12: Vitamin B-12: 513 pg/mL (ref 180–914)

## 2024-09-04 LAB — FOLATE: Folate: 18.7 ng/mL (ref >5.9)

## 2024-09-04 NOTE — Assessment & Plan Note (Signed)
 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 CT AP on 06/17/22 showed: calcified mesenteric mass with suspected associated small bowel mass; suspicious right hepatic lesion; nodular enhancement along wall of bowel loop in central abdomen.  -abdomen MRI 06/23/22 showed: 3.9 cm mass in right hepatic lobe; small RLQ mesenteric mass or lymphadenopathy. 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 -PET DOTATATE from 08/12/22 showed avid disease within the liver, mesentery and small bowel. -he began Sandostatin  injections on 08/17/22. He reports worsening hyperglycemia, and decreased appetite, which are likely related to the injections.  So we stopped after last injection in 11/2022 -He is clinically stable no increased diarrhea, flushing, or abdominal cramps since he came off the injection.  Continue clinical observation. -I also reviewed the other treatment options, such as Afinitor or Lutathera, if he has significant disease progression.  -restaging CT in 01/2024 showed stable disease

## 2024-09-04 NOTE — Progress Notes (Signed)
 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 CT AP on 06/17/22 showed: calcified mesenteric mass with suspected associated small bowel mass; suspicious right hepatic lesion; nodular enhancement along wall of bowel loop in central abdomen.  -abdomen MRI 06/23/22 showed: 3.9 cm mass in right hepatic lobe; small RLQ mesenteric mass or lymphadenopathy. 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 -PET DOTATATE from 08/12/22 showed avid disease within the liver, mesentery and small bowel. -he began Sandostatin  injections on 08/17/22. He reports worsening hyperglycemia, and decreased appetite, which are likely related to the injections.  So we stopped after last injection in 11/2022 -He is clinically stable no increased diarrhea, flushing, or abdominal cramps since he came off the injection.  Continue clinical observation. -I also reviewed the other treatment options, such as Afinitor or Lutathera, if he has significant disease progression.  -restaging CT in 01/2024 showed stable disease   Assessment & Plan Neuroendocrine tumor, stable Neuroendocrine tumor is well-managed with no significant symptoms. Recent CT scan in July showed stability. No abdominal pain, cramps, or significant diarrhea. Occasional loose stools managed with Senokot. - Continue current management and monitoring. - Will schedule  periodic CT or PET scans as needed, especially during hospital visits.  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 Ferritin level was low at 8 a few months ago. Hemoglobin is 11.9, slightly lower than previous levels. Symptoms include fatigue and low energy. - Continue IV iron  infusions through the [place] clinic. - Monitor iron  levels and hemoglobin at [facility] - Follow up with [facility] hematologist on December 11th for blood work.  Chronic hematuria and bilateral nephrolithiasis with left ureteral stent Chronic hematuria and bilateral nephrolithiasis with a left ureteral stent. Potential source of blood loss contributing to iron  deficiency. Recent hospitalization for sepsis led to stent placement. Urologist Dr. Carlyle is managing the condition. - Continue follow-up with urologist Dr. Carlyle on November 10th, including KUB and physician visit.  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 Previous side effects included visual hallucinations, managed with Seroquel , which was discontinued due to adverse effects. Neurological symptoms may be exacerbated by anemia and deconditioning. [facility] for further evaluation. - [facility] on [facility] 19th. - Attend appointment with [facility] on December 1st.  Deconditioning Due to prolonged immobility and hospitalization since May. Limited ability to perform physical therapy due to mental and physical resources. Engages in chair and bed exercises with assistance. - Encouraged participation in chair and bed exercises to improve physical condition. - Continue daily assistance with mobility and exercises.   Plan - I have reviewed her chart, including labs and images in the past 6  months - Patient's wife has many concerns, mainly related to his neurology symptoms.  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   EXAM: CT ABDOMEN AND PELVIS WITH CONTRAST  IMPRESSION: 1. 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 2. Hepatic lesion as discussed above in the posterior RIGHT hemiliver is suspicious for metastasis in this context. For all above findings in lieu of hepatic MRI, if the patient is not able to undergo hepatic MRI, biochemical correlation and DOTATATE PET may be helpful. 3. Bowel loop in the central abdomen with nodular enhancement approximately 8-9 mm along the wall in terms of greatest wall thickness. 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 4. Hepatic steatosis. 5. Resolution of inflammation about the pancreatic tail with nodular appearance of pancreatic tail. Attention on subsequent imaging is suggested. This may reflect sequela of prior inflammation given improvement but underlying lesion in this location while not favored is not excluded.  ADDENDUM: For  clarification, the central, ileal mesenteric lesion currently measuring 2.1 x 2.1 x 2.4 cm measured 1.6 x 1.8 x 2.1 cm in 2015.   Faintly visible area in the posterior RIGHT hemiliver in 2015 raising the question of possibility of small lesion at best 2 x 2.2 cm in the posterior RIGHT hemiliver this areas now clearly visible measuring 4.1 x 4.0 cm. Findings are compatible with indolent behavior. Indolent carcinoid remains the primary differential consideration. Tumor marker correlation is suggested with DOTATATE PET for further evaluation as warranted.   06/23/2022 Imaging   EXAM: MRI ABDOMEN WITHOUT AND WITH CONTRAST  IMPRESSION: 3.9 cm heterogeneous enhancing mass in segment 7 of the right hepatic lobe, which is nonspecific. Hepatic metastasis cannot be excluded.   Small benign hemangioma and cysts in the posterior right hepatic lobe.   Small right lower quadrant mesenteric mass or lymphadenopathy, without significant change.   Stable 2.1 cm cystic lesion in the pancreatic neck, with other tiny sub-cm cystic foci in the pancreatic head and body. Differential diagnosis includes pancreatic pseudocysts and indolent IPMNs. Recommend continued follow-up by MRI in 6 months. This recommendation follows ACR consensus guidelines: Management of Incidental Pancreatic Cysts: A White Paper of the ACR Incidental Findings Committee. J Am Coll Radiol 2017;14:911-923.   7 mm calculus in the left renal pelvis, with mild left pelvicaliectasis.   07/14/2022 Initial Biopsy   FINAL MICROSCOPIC DIAGNOSIS:   A. LIVER, RIGHT, NEEDLE CORE BIOPSY:  -  Metastatic low-grade neuroendocrine tumor (positive for chromogranin and synaptophysin).  -  Proliferation rate by Ki-67 less than 3%.    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 Approximately 2.8 cm long segment of small bowel loop in the right lower quadrant, posteriorly, which exhibit asymmetric circumferential hyperattenuating mass, which may correspond to patient's known neuroendocrine tumor primary. No proximal bowel obstruction. 2. Redemonstration of a lobulated heterogeneous mass with extensive dystrophic calcifications along the mesentery measuring approximately 1.5 x 2.1 cm, which was markedly FDG avid on the prior exam and is compatible with metastatic disease. No significant interval change in size. 3. Redemonstration of several focal liver lesions, grossly similar to the prior study. The largest lesion in the right hepatic lobe, segment 7 is compatible with patient's known metastasis. No new arterial hyperenhancing lesion seen. 4. Multiple other nonacute observations, as described above.        Discussed the use of AI scribe software for clinical note transcription with the patient, who gave verbal consent to proceed.  History of Present Illness Jordan [name] is an 83 year old male with a neuroendocrine tumor who presents for follow-up. He is accompanied by his caregiver, who is actively involved in his care.  He is undergoing IV iron  infusions at the [facility] due to low ferritin levels, with a recorded low of eight. He is on his third of five scheduled infusions. The low iron  levels contribute to extreme fatigue, increased sleep, and lack of energy. Anemia and iron  deficiency have developed over the past six months, with previous infusion attempts complicated by hospitalizations and insurance issues.  He has a history of chronic hematuria and kidney stones, with a stent in his left kidney. He is on Xarelto  for vascular Parkinsonism following a stroke. He experiences occasional loose stools due to Senokot use but has no significant gastrointestinal symptoms  like stomach pain or diarrhea.  He is currently unable to stand and resides at [facility], deconditioned due to prolonged bed rest and weakness from iron  deficiency. He has been in and out of the hospital since late March to early April, with a significant decline in condition since spring. He was diagnosed with metabolic encephalopathy during one hospital stay, possibly related to an infection. He is unable to walk and requires assistance for daily activities, engaging in some chair and bed exercises, and spends about four hours a day out of bed with assistance.     All other systems were reviewed with the patient and are negative.  MEDICAL HISTORY:  Past Medical History:  Diagnosis Date   Arthritis    R shoulder, bone spur   Arthritis    hands (03/25/2016)   Chronic diastolic heart failure (HCC)    Chronic lower back pain    Coronary heart disease    Dr Esmeralda Sharps   [facility])    Diverticulitis 12/2021   DVT (deep venous thrombosis) (HCC) 05/2022   left leg   H/O cardiovascular stress test    perhaps last one was 2009   Heart murmur    12/29/11 echo: mild MR, no AS, trivial TR   History of gout    Hyperlipidemia    Hypertension    saw Dr. HILARIO Sharps last earl;y- 2014, cardiac cath. last done ?2009, blocks seen didn't require any intervention at that point.   Narcolepsy    [name] 04-11-97; Mean Latency 1.57min, SOREM 2   OSA on CPAP    NPSG 12-13-98 AHI 22.7   Parkinson's disease (HCC)    Vascular  PONV (postoperative nausea and vomiting)    Presence of permanent cardiac pacemaker    REM sleep behavior disorder    Shortness of breath    with exertion    Stroke (HCC)    2021. Slight right sided weakness   Type II diabetes mellitus (HCC)     SURGICAL HISTORY: Past Surgical History:  Procedure Laterality Date   CARDIAC CATHETERIZATION  2009?   CYSTOSCOPY W/ URETERAL STENT PLACEMENT Left 05/13/2024   Procedure: CYSTOSCOPY WITH URETERAL STENT  INSERTION;  Surgeon: Francisca Redell BROCKS, MD;  Location: [facility] OR;  Service: Urology;  Laterality: Left;   EP IMPLANTABLE DEVICE N/A 03/25/2016   Procedure: Pacemaker Implant - Dual Chamber;  Surgeon: Danelle LELON Birmingham, MD;  Location: [facility] INVASIVE CV LAB;  Service: Cardiovascular;  Laterality: N/A;   FRACTURE SURGERY     HIP FRACTURE SURGERY     INSERT / REPLACE / REMOVE PACEMAKER  03/24/2016   INTRAMEDULLARY (IM) NAIL INTERTROCHANTERIC Left 02/08/2022   Procedure: INTRAMEDULLARY (IM) NAIL INTERTROCHANTRIC;  Surgeon: Addie Cordella Hamilton, MD;  Location: [facility] OR;  Service: Orthopedics;  Laterality: Left;   IR KYPHO LUMBAR INC FX REDUCE BONE BX UNI/BIL CANNULATION INC/IMAGING  12/29/2021   IR RADIOLOGIST EVAL & MGMT  12/15/2021   IR RADIOLOGY PERIPHERAL GUIDED IV START  07/14/2022   IR US  GUIDE VASC ACCESS RIGHT  07/15/2022   LEFT AND RIGHT HEART CATHETERIZATION WITH CORONARY ANGIOGRAM N/A 06/05/2014   Procedure: LEFT AND RIGHT HEART CATHETERIZATION WITH CORONARY ANGIOGRAM;  Surgeon: Victory LELON Claudene DOUGLAS, MD;  Location: [facility] CATH LAB;  Service: Cardiovascular;  Laterality: N/A;   NASAL FRACTURE SURGERY     SHOULDER ARTHROSCOPY WITH ROTATOR CUFF REPAIR AND SUBACROMIAL DECOMPRESSION Right 08/16/2013   Procedure: SHOULDER ARTHROSCOPY WITH ROTATOR CUFF REPAIR AND SUBACROMIAL DECOMPRESSION;  Surgeon: Eva Elsie Herring, MD;  Location: [facility] OR;  Service: Orthopedics;  Laterality: Right;  Right shoulder arthroscopy rotator cuff repair, subacromial decompression.   TONSILLECTOMY      I have reviewed the social history and family history with the patient and they are unchanged from previous note.  ALLERGIES:  is allergic to amantadines, clonazepam , lisinopril , and zoloft  [sertraline ].  MEDICATIONS:  Current Outpatient Medications  Medication Sig Dispense Refill   acetaminophen  (TYLENOL ) 500 MG tablet Take 1,000 mg by mouth every 6 (six) hours as needed for moderate pain (pain score 4-6) or mild pain (pain score  1-3).     atorvastatin  (LIPITOR) 20 MG tablet Take 20 mg by mouth at bedtime.     b complex vitamins capsule Take 1 capsule by mouth at bedtime.     Camphor-Menthol-Methyl Sal (HM SALONPAS PAIN RELIEF EX) Apply 1 patch topically daily as needed (Back pain).     Carbidopa -Levodopa  ER (SINEMET  CR) 25-100 MG tablet controlled release Take 1 tablet by mouth 3 (three) times daily with meals. (Patient taking differently: Take 1 tablet by mouth 3 (three) times daily with meals. Take one tablet by mouth three times daily at 0800, 1200, and 1730)     Cholecalciferol  1.25 MG (50000 UT) capsule Take 50,000 Units by mouth once a week. Take one capsule by  mouth every Tuesday.     clotrimazole-betamethasone (LOTRISONE) cream Apply 1 Application topically daily as needed (Rash).     feeding supplement, GLUCERNA SHAKE, (GLUCERNA SHAKE) LIQD Take 237 mLs by mouth 2 (two) times daily between meals. Alternate with ensure Diabetic     insulin  lispro (HUMALOG) 100 UNIT/ML injection Inject 0-10 Units into the skin 4 (  four) times daily -  before meals and at bedtime. Sliding scale as follows;    0-200=   0 units 201-250= 2 units 251-300= 4 units 301-350= 6 units 351-400= 8 units 400= 10 units PUSH FLUIDS  and repeat CBG  in 2 hours. IF reading is still >400, notify doctor.     JANUVIA 100 MG tablet Take 100 mg by mouth daily.     lidocaine  (LIDODERM ) 5 % Place 1 patch onto the skin daily as needed. Purchase over the counter. On for 12 hours and off for 12 hours 30 patch 0   Melatonin 5 MG CAPS Take 5 mg by mouth at bedtime.     metFORMIN  (GLUCOPHAGE ) 1000 MG tablet Take 1,000 mg by mouth 2 (two) times daily.     midodrine  (PROAMATINE ) 2.5 MG tablet Take 2.5 mg by mouth daily as needed (Cardiovascular support). Take one tablet by mouth daily as needed for SBP <120     modafinil  (PROVIGIL ) 100 MG tablet Take 100 mg by mouth daily.     NITROSTAT  0.4 MG SL tablet PLACE 1 TABLET (0.4 MG TOTAL) UNDER THE TONGUE EVERY 5  (FIVE) MINUTES AS NEEDED FOR CHEST PAIN. 25 tablet 5   polyethylene glycol (MIRALAX  / GLYCOLAX ) 17 g packet Take 17 g by mouth 2 (two) times daily. (Patient taking differently: Take 17 g by mouth daily as needed.)     QUEtiapine  (SEROQUEL ) 50 MG tablet Take 50 mg by mouth at bedtime.     rivaroxaban  (XARELTO ) 20 MG TABS tablet Take 1 tablet (20 mg total) by mouth daily. 30 tablet 1   rivastigmine  (EXELON ) 9.5 mg/24hr Place 9.5 mg onto the skin daily.     senna (SENOKOT) 8.6 MG TABS tablet Take 1 tablet (8.6 mg total) by mouth daily.     tamsulosin  (FLOMAX ) 0.4 MG CAPS capsule Take 1 capsule (0.4 mg total) by mouth at bedtime.     traZODone  (DESYREL ) 50 MG tablet Take 50 mg by mouth at bedtime.     No current facility-administered medications for this visit.    PHYSICAL EXAMINATION: ECOG PERFORMANCE STATUS: 3 - Symptomatic, >50% confined to bed  Vitals:   09/04/24 1356 09/04/24 1401  BP: 90/60 104/62  Pulse: 62 64  Resp: 14   Temp: 97.6 F (36.4 C)   SpO2: 94% 97%   Wt Readings from Last 3 Encounters:  06/15/24 178 lb (80.7 kg)  06/14/24 178 lb (80.7 kg)  05/25/24 185 lb (83.9 kg)     GENERAL:alert, in wheelchair with hand tremor  SKIN: skin color, texture, turgor are normal, no rashes or significant lesions EYES: normal, Conjunctiva are pink and non-injected, sclera clear NECK: supple, thyroid  normal size, non-tender, without nodularity LYMPH:  no palpable lymphadenopathy in the cervical, axillary  LUNGS: clear to auscultation and percussion with normal breathing effort HEART: regular rate & rhythm and no murmurs and no lower extremity edema ABDOMEN:abdomen soft, non-tender and normal bowel sounds  Physical Exam    LABORATORY DATA:  I have reviewed the data as listed    Latest Ref Rng & Units 09/04/2024    1:21 PM 06/15/2024    1:55 PM 05/17/2024    9:00 AM  CBC  WBC 4.0 - 10.5 K/uL 9.5  10.9  7.4   Hemoglobin 13.0 - 17.0 g/dL 88.0  86.7  86.5   Hematocrit 39.0 -  52.0 % 37.2  39.9  40.3   Platelets 150 - 400 K/uL 256  227  185  Latest Ref Rng & Units 06/15/2024    1:55 PM 05/17/2024    9:00 AM 05/16/2024   10:52 AM  CMP  Glucose 70 - 99 mg/dL 848  839  747   BUN 8 - 23 mg/dL 15  13  13    Creatinine 0.61 - 1.24 mg/dL 9.17  9.18  9.12   Sodium 135 - 145 mmol/L 139  139  138   Potassium 3.5 - 5.1 mmol/L 4.1  3.9  3.2   Chloride 98 - 111 mmol/L 101  103  101   CO2 22 - 32 mmol/L 29  25  26    Calcium  8.9 - 10.3 mg/dL 9.8  9.2  8.8   Total Protein 6.5 - 8.1 g/dL 6.9   5.8   Total Bilirubin 0.0 - 1.2 mg/dL 0.5   0.5   Alkaline Phos 38 - 126 U/L 92   146   AST 15 - 41 U/L 12   39   ALT 0 - 44 U/L <5   32       RADIOGRAPHIC STUDIES: I have personally reviewed the radiological images as listed and agreed with the findings in the report. No results found.    No orders of the defined types were placed in this encounter.  All questions were answered. The patient knows to call the clinic with any problems, questions or concerns. No barriers to learning was detected. The total time spent in the appointment was 30 minutes, including review of chart and various tests results, discussions about plan of care and coordination of care plan     Onita Mattock, MD 09/04/2024

## 2024-09-09 ENCOUNTER — Ambulatory Visit: Payer: Self-pay | Admitting: Hematology

## 2024-09-09 NOTE — Progress Notes (Incomplete)
 Assessment: 1.  Large left renal pelvic stone.  14 mm, at last measurement in April of this year.  Asymptomatic although he does have gross hematuria.  He is on Eliquis  2.  2 left distal ureteral calculi on April CT scan.  No hydronephrosis.  He has been on tamsulosin  for 3 months.  More than likely, based on size, he has passed these  3.  Gross hematuria, most likely related to problem #1  4.  BPH based on size criteria  5.  Most recently admitted for urosepsis with left ureteral calculi, stented.  Urine appears clear today   Plan:    History of Present Illness:  Initially seen June 30 of this year for evaluation of a left renal calculus. . He does have a history of gross hematuria.  He was admitted to the hospital in March for failure to thrive with advanced Parkinson's disease with cognitive deficit.  The patient is in a skilled care facility at this time, at [name].  He is wheelchair confined.  He has been followed at [facility] for his left renal calculi as well as BPH and hematuria.  He currently has no complaints of left-sided flank pain.  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  CT scan from April 3 of this year also revealed 2 small left lower ureteral stones.  There was no evident hydronephrosis.  He passes most of his urine in a diaper.  He also has some fecal incontinence.  7.13.2025: Underwent urgent placement of left ureteral stent by Dr. Francisca for obstructing left distal ureteral stone as well as UTI/sepsis. Culture + for klebsiella res to macrobid/ampicillin  7.23.2025: Here today for follow-up.  Prior to his admission on the 12th of last month, he did have cognitive decline, perhaps at this point felt to be due to underlying urinary tract infection.  10.22.2025:  Past Medical History:  Past Medical History:  Diagnosis Date   Arthritis    R shoulder, bone spur   Arthritis    hands (03/25/2016)   Chronic diastolic heart failure (HCC)     Chronic lower back pain    Coronary heart disease    Dr Esmeralda Sharps   [facility])    Diverticulitis 12/2021   DVT (deep venous thrombosis) (HCC) 05/2022   left leg   H/O cardiovascular stress test    perhaps last one was 2009   Heart murmur    12/29/11 echo: mild MR, no AS, trivial TR   History of gout    Hyperlipidemia    Hypertension    saw Dr. HILARIO Sharps last earl;y- 2014, cardiac cath. last done ?2009, blocks seen didn't require any intervention at that point.   Narcolepsy    [name] 04-11-97; Mean Latency 1.90min, SOREM 2   OSA on CPAP    NPSG 12-13-98 AHI 22.7   Parkinson's disease (HCC)    Vascular   PONV (postoperative nausea and vomiting)    Presence of permanent cardiac pacemaker    REM sleep behavior disorder    Shortness of breath    with exertion    Stroke (HCC)    2021. Slight right sided weakness   Type II diabetes mellitus [facility])     Past Surgical History:  Past Surgical History:  Procedure Laterality Date   CARDIAC CATHETERIZATION  2009?   CYSTOSCOPY W/ URETERAL STENT PLACEMENT Left 05/13/2024   Procedure: CYSTOSCOPY WITH URETERAL STENT INSERTION;  Surgeon: Francisca Redell BROCKS, MD;  [facility];  Service: Urology;  Laterality: Left;   EP IMPLANTABLE DEVICE N/A 03/25/2016   Procedure: Pacemaker Implant - Dual Chamber;  Surgeon: Danelle LELON Birmingham, MD;  Location: [facility] INVASIVE CV LAB;  Service: Cardiovascular;  Laterality: N/A;   FRACTURE SURGERY     HIP FRACTURE SURGERY     INSERT / REPLACE / REMOVE PACEMAKER  03/24/2016   INTRAMEDULLARY (IM) NAIL INTERTROCHANTERIC Left 02/08/2022   Procedure: INTRAMEDULLARY (IM) NAIL INTERTROCHANTRIC;  Surgeon: Addie Cordella Hamilton, MD;  Location: [facility] OR;  Service: Orthopedics;  Laterality: Left;   IR KYPHO LUMBAR INC FX REDUCE BONE BX UNI/BIL CANNULATION INC/IMAGING  12/29/2021   IR RADIOLOGIST EVAL & MGMT  12/15/2021   IR RADIOLOGY PERIPHERAL GUIDED IV START  07/14/2022   IR US  GUIDE VASC ACCESS RIGHT  07/15/2022   LEFT AND RIGHT  HEART CATHETERIZATION WITH CORONARY ANGIOGRAM N/A 06/05/2014   Procedure: LEFT AND RIGHT HEART CATHETERIZATION WITH CORONARY ANGIOGRAM;  Surgeon: Victory LELON Claudene DOUGLAS, MD;  Location: [facility] CATH LAB;  Service: Cardiovascular;  Laterality: N/A;   NASAL FRACTURE SURGERY     SHOULDER ARTHROSCOPY WITH ROTATOR CUFF REPAIR AND SUBACROMIAL DECOMPRESSION Right 08/16/2013   Procedure: SHOULDER ARTHROSCOPY WITH ROTATOR CUFF REPAIR AND SUBACROMIAL DECOMPRESSION;  Surgeon: Eva Elsie Herring, MD;  [facility] OR;  Service: Orthopedics;  Laterality: Right;  Right shoulder arthroscopy rotator cuff repair, subacromial decompression.   TONSILLECTOMY      Allergies:  Allergies  Allergen Reactions   Amantadines Other (See Comments)    Mental changes   Clonazepam  Other (See Comments)    Mental changes   Lisinopril  Other (See Comments)    Unknown reaction   Zoloft  [Sertraline ] Other (See Comments)    Mental changes    Family History:  Family History  Problem Relation Age of Onset   Sleep apnea Sister    Colon cancer Sister     Social History:  Social History   Tobacco Use   Smoking status: Former    Current packs/day: 0.00    Average packs/day: 2.5 packs/day for 6.0 years (15.0 ttl pk-yrs)    Types: Cigarettes    Start date: 06/04/1961    Quit date: 06/05/1967    Years since quitting: 57.3   Smokeless tobacco: Never  Vaping Use   Vaping status: Never Used  Substance Use Topics   Alcohol  use: Yes    Comment: rare   Drug use: No    Review of symptoms:  Constitutional:  Negative for unexplained weight loss, night sweats, fever, chills ENT:  Negative for nose bleeds, sinus pain, painful swallowing CV:  Negative for chest pain, shortness of breath, exercise intolerance, palpitations, loss of consciousness Resp:  Negative for cough, wheezing, shortness of breath GI:  Negative for nausea, vomiting, diarrhea, bloody stools GU:  Positives noted in HPI; otherwise negative for gross hematuria,  dysuria, urinary incontinence Neuro:  Negative for seizures, poor balance, limb weakness, slurred speech Psych:  Negative for lack of energy, depression, anxiety Endocrine:  Negative for polydipsia, polyuria, symptoms of hypoglycemia (dizziness, hunger, sweating) Hematologic:  Negative for anemia, purpura, petechia, prolonged or excessive bleeding, use of anticoagulants  Allergic:  Negative for difficulty breathing or choking as a result of exposure to anything; no shellfish allergy; no allergic response (rash/itch) to materials, foods  Physical exam: There were no vitals taken for this visit. GENERAL APPEARANCE: Somewhat lethargic, in a wheelchair HEENT: Atraumatic, Normocephalic. NECK: Normal appearance LUNGS: Normal inspiratory and expiratory excursion HEART: Regular Rate EXTREMITIES: Moves  all extremities well.  Without clubbing, cyanosis, or edema. NEUROLOGIC:  Alert and oriented x 3, normal gait, CN II-XII grossly intact.  MENTAL STATUS:  Appropriate. SKIN:  Warm, dry and intact.    Results:   I have reviewed prior imaging--CT images from most recent hospitalization.  Skin to stone distance 8 cm, Hounsfield units 850.  Prostate volume measurement approximately 50 mL  I have reviewed [facility] records as well as most recent [facility] records  Recent urine culture reviewed  I/O catheterization was performed to collect urine

## 2024-09-10 ENCOUNTER — Encounter: Payer: Self-pay | Admitting: Urology

## 2024-09-10 ENCOUNTER — Ambulatory Visit: Payer: Medicare (Managed Care) | Admitting: Urology

## 2024-09-10 DIAGNOSIS — N201 Calculus of ureter: Secondary | ICD-10-CM

## 2024-09-10 DIAGNOSIS — N1330 Unspecified hydronephrosis: Secondary | ICD-10-CM

## 2024-09-10 DIAGNOSIS — N200 Calculus of kidney: Secondary | ICD-10-CM

## 2024-09-10 DIAGNOSIS — N400 Enlarged prostate without lower urinary tract symptoms: Secondary | ICD-10-CM

## 2024-09-11 ENCOUNTER — Inpatient Hospital Stay: Payer: Medicare (Managed Care)

## 2024-09-11 ENCOUNTER — Encounter: Payer: Self-pay | Admitting: Hematology

## 2024-09-11 ENCOUNTER — Inpatient Hospital Stay: Payer: Medicare (Managed Care) | Admitting: Hematology

## 2024-09-11 NOTE — Telephone Encounter (Addendum)
 Notified patient via MyChart message w/ provider's comments from below.   ----- Message from Onita Mattock sent at 09/09/2024 10:09 PM EST ----- Please let his wife know the lab results, his iron , B12, folate levels are normal, no concerns, thanks  Onita Mattock  ----- Message ----- From: Rebecka, Lab In [place] Sent: 09/04/2024   1:36 PM EST To: Onita Mattock, MD

## 2024-09-12 ENCOUNTER — Telehealth: Payer: Self-pay

## 2024-09-12 NOTE — Telephone Encounter (Signed)
 Copied from CRM [id number]. Topic: Clinical - Medical Advice >> Sep 11, 2024 11:06 AM Corean [name] wrote: Reason for CRM: Patients wife Burnard is requesting  advise as patient is not sleeping through the night and due to his restlessness his CPAP will not stay on and he is not getting the full benefit of the CPAP machine. Please call Burnard back to advise at  [phone]

## 2024-09-13 DIAGNOSIS — K5900 Constipation, unspecified: Secondary | ICD-10-CM | POA: Diagnosis not present

## 2024-09-13 DIAGNOSIS — G20A1 Parkinson's disease without dyskinesia, without mention of fluctuations: Secondary | ICD-10-CM | POA: Diagnosis not present

## 2024-09-13 DIAGNOSIS — R790 Abnormal level of blood mineral: Secondary | ICD-10-CM | POA: Diagnosis not present

## 2024-09-13 NOTE — Telephone Encounter (Signed)
 His medical condition is complicated now. Since he is followed by Neurology, I suggest wife call their Neurology office and ask if it would be ok to tr Trazodone  for sleep. If not, maybe they can suggest something else.

## 2024-09-13 NOTE — Telephone Encounter (Signed)
 Patient wife would like to stay away Rx and if there is any other alternative  any was they can do nasal cannula   instead of the pillow mask ?

## 2024-09-14 NOTE — Telephone Encounter (Signed)
 For a non-prescription sleep aid, she could let him try CBD gummies, Zzquill, or melatonin. I write the CPAP prescription saying mask of choice. She can contact his home care company and ask them to try nasal pillows instead of his current CPAP mask.

## 2024-09-14 NOTE — Telephone Encounter (Signed)
Routing to Dr. Maple Hudson

## 2024-09-14 NOTE — Telephone Encounter (Signed)
 I called and spoke to [place], [place]. Burnard states the pt is Already using nasal pillows. Burnard states he is already taking 3 mg melatonin to help him sleep. Burnard states due to his neurological issues, she does not want to put him on ZZZquil. Pt does have a neurology appt next week and she will also see what the neurologist recommends as well. NFN

## 2024-09-17 DIAGNOSIS — M549 Dorsalgia, unspecified: Secondary | ICD-10-CM | POA: Diagnosis not present

## 2024-09-17 DIAGNOSIS — G20A1 Parkinson's disease without dyskinesia, without mention of fluctuations: Secondary | ICD-10-CM | POA: Diagnosis not present

## 2024-09-17 DIAGNOSIS — N201 Calculus of ureter: Secondary | ICD-10-CM | POA: Diagnosis not present

## 2024-09-18 DIAGNOSIS — N201 Calculus of ureter: Secondary | ICD-10-CM | POA: Diagnosis not present

## 2024-09-18 DIAGNOSIS — G20A1 Parkinson's disease without dyskinesia, without mention of fluctuations: Secondary | ICD-10-CM | POA: Diagnosis not present

## 2024-09-18 DIAGNOSIS — M549 Dorsalgia, unspecified: Secondary | ICD-10-CM | POA: Diagnosis not present

## 2024-09-19 ENCOUNTER — Ambulatory Visit: Payer: Medicare (Managed Care) | Admitting: Urology

## 2024-09-20 ENCOUNTER — Other Ambulatory Visit: Payer: Self-pay

## 2024-09-20 DIAGNOSIS — C7B8 Other secondary neuroendocrine tumors: Secondary | ICD-10-CM

## 2024-09-20 DIAGNOSIS — D3A8 Other benign neuroendocrine tumors: Secondary | ICD-10-CM

## 2024-09-24 NOTE — Progress Notes (Unsigned)
 Assessment: 1.  Large left renal pelvic stone.  14 mm, at last measurement in April of this year.  Asymptomatic although he does have gross hematuria.  He is on Eliquis  2.  2 left distal ureteral calculi on April CT scan.  No hydronephrosis.  He has been on tamsulosin  for 3 months.  More than likely, based on size, he has passed these  3.  Gross hematuria, most likely related to problem #1  4.  BPH based on size criteria  5.  Most recently admitted for urosepsis with left ureteral calculi, stented.  Urine appears clear today   Plan:    History of Present Illness:  Initially seen June 30 of this year for evaluation of a left renal calculus. . He does have a history of gross hematuria.  He was admitted to the hospital in March for failure to thrive with advanced Parkinson's disease with cognitive deficit.  The patient is in a skilled care facility at this time, at [name].  He is wheelchair confined.  He has been followed at [facility] for his left renal calculi as well as BPH and hematuria.  He currently has no complaints of left-sided flank pain.  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  CT scan from April 3 of this year also revealed 2 small left lower ureteral stones.  There was no evident hydronephrosis.  He passes most of his urine in a diaper.  He also has some fecal incontinence.  7.13.2025: Underwent urgent placement of left ureteral stent by Dr. Francisca for obstructing left distal ureteral stone as well as UTI/sepsis. Culture + for klebsiella res to macrobid/ampicillin  7.23.2025: Here today for follow-up.  Prior to his admission on the 12th of last month, he did have cognitive decline, perhaps at this point felt to be due to underlying urinary tract infection.  10.22.2025:  Past Medical History:  Past Medical History:  Diagnosis Date   Arthritis    R shoulder, bone spur   Arthritis    hands (03/25/2016)   Chronic diastolic heart failure (HCC)     Chronic lower back pain    Coronary heart disease    Dr Esmeralda Sharps   [facility])    Diverticulitis 12/2021   DVT (deep venous thrombosis) (HCC) 05/2022   left leg   H/O cardiovascular stress test    perhaps last one was 2009   Heart murmur    12/29/11 echo: mild MR, no AS, trivial TR   History of gout    Hyperlipidemia    Hypertension    saw Dr. HILARIO Sharps last earl;y- 2014, cardiac cath. last done ?2009, blocks seen didn't require any intervention at that point.   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 Slight right sided weakness   Type II diabetes mellitus [facility])     Past Surgical History:  Past Surgical History:  Procedure Laterality Date   CARDIAC CATHETERIZATION  2009?   CYSTOSCOPY W/ URETERAL STENT PLACEMENT Left 05/13/2024   Procedure: CYSTOSCOPY WITH URETERAL STENT INSERTION;  Surgeon: Francisca Redell BROCKS, MD;  [facility];  Service: Urology;  Laterality: Left;   EP IMPLANTABLE DEVICE N/A 03/25/2016   Procedure: Pacemaker Implant - Dual Chamber;  Surgeon: Danelle LELON Birmingham, MD;  Location: [facility] INVASIVE CV LAB;  Service: Cardiovascular;  Laterality: N/A;   FRACTURE SURGERY     HIP FRACTURE SURGERY     INSERT / REPLACE / REMOVE PACEMAKER  03/24/2016   INTRAMEDULLARY (IM) NAIL INTERTROCHANTERIC Left 02/08/2022   Procedure: INTRAMEDULLARY (IM) NAIL INTERTROCHANTRIC;  Surgeon: Addie Cordella Hamilton, MD;  [facility] OR;  Service: Orthopedics;  Laterality: Left;   IR KYPHO LUMBAR INC FX REDUCE BONE BX UNI/BIL CANNULATION INC/IMAGING  12/29/2021   IR RADIOLOGIST EVAL & MGMT  12/15/2021   IR RADIOLOGY PERIPHERAL GUIDED IV START  07/14/2022   IR US  GUIDE VASC ACCESS RIGHT  07/15/2022   LEFT AND RIGHT  HEART CATHETERIZATION WITH CORONARY ANGIOGRAM N/A 06/05/2014   Procedure: LEFT AND RIGHT HEART CATHETERIZATION WITH CORONARY ANGIOGRAM;  Surgeon: Victory LELON Claudene DOUGLAS, MD;  Location: [facility] CATH LAB;  Service: Cardiovascular;  Laterality: N/A;   NASAL FRACTURE SURGERY     SHOULDER ARTHROSCOPY WITH ROTATOR CUFF REPAIR AND SUBACROMIAL DECOMPRESSION Right 08/16/2013   Procedure: SHOULDER ARTHROSCOPY WITH ROTATOR CUFF REPAIR AND SUBACROMIAL DECOMPRESSION;  Surgeon: Eva Elsie Herring, MD;  [facility] OR;  Service: Orthopedics;  Laterality: Right;  Right shoulder arthroscopy rotator cuff repair, subacromial decompression.   TONSILLECTOMY      Allergies:  Allergies  Allergen Reactions   Amantadines Other (See Comments)    Mental changes   Clonazepam  Other (See Comments)    Mental changes   Lisinopril  Other (See Comments)    Unknown reaction   Zoloft  [Sertraline ] Other (See Comments)    Mental changes    Family History:  Family History  Problem Relation Age of Onset   Sleep apnea Sister    Colon cancer Sister     Social History:  Social History   Tobacco Use   Smoking status: Former    Current packs/day: 0.00    Average packs/day: 2.5 packs/day for 6.0 years (15.0 ttl pk-yrs)    Types: Cigarettes    Start date: 06/04/1961    Quit date: 06/05/1967    Years since quitting: 57.3   Smokeless tobacco: Never  Vaping Use   Vaping status: Never Used  Substance Use Topics   Alcohol  use: Yes    Comment: rare   Drug use: No    Review of symptoms:  Constitutional:  Negative for unexplained weight loss, night sweats, fever, chills ENT:  Negative for nose bleeds, sinus pain, painful swallowing CV:  Negative for chest pain, shortness of breath, exercise intolerance, palpitations, loss of consciousness Resp:  Negative for cough, wheezing, shortness of breath GI:  Negative for nausea, vomiting, diarrhea, bloody stools GU:  Positives noted in HPI; otherwise negative for gross hematuria,  dysuria, urinary incontinence Neuro:  Negative for seizures, poor balance, limb weakness, slurred speech Psych:  Negative for lack of energy, depression, anxiety Endocrine:  Negative for polydipsia, polyuria, symptoms of hypoglycemia (dizziness, hunger, sweating) Hematologic:  Negative for anemia, purpura, petechia, prolonged or excessive bleeding, use of anticoagulants  Allergic:  Negative for difficulty breathing or choking as a result of exposure to anything; no shellfish allergy; no allergic response (rash/itch) to materials, foods  Physical exam: There were no vitals taken for this visit. GENERAL APPEARANCE: Somewhat lethargic, in a wheelchair HEENT: Atraumatic, Normocephalic. NECK: Normal appearance LUNGS: Normal inspiratory and expiratory excursion HEART: Regular Rate EXTREMITIES: Moves  all extremities well.  Without clubbing, cyanosis, or edema. NEUROLOGIC:  Alert and oriented x 3, normal gait, CN II-XII grossly intact.  MENTAL STATUS:  Appropriate. SKIN:  Warm, dry and intact.    Results:   I have reviewed prior imaging--CT images from most recent hospitalization.  Skin to stone distance 8 cm, Hounsfield units 850.  Prostate volume measurement approximately 50 mL  I have reviewed [facility] records as well as most recent [facility] records  Recent urine culture reviewed  I/O catheterization was performed to collect urine

## 2024-09-26 ENCOUNTER — Ambulatory Visit: Payer: Medicare (Managed Care) | Admitting: Urology

## 2024-09-26 ENCOUNTER — Ambulatory Visit (HOSPITAL_BASED_OUTPATIENT_CLINIC_OR_DEPARTMENT_OTHER)
Admission: RE | Admit: 2024-09-26 | Discharge: 2024-09-26 | Disposition: A | Discharge status: Home / Self Care | Payer: Medicare (Managed Care) | Source: Ambulatory Visit | Attending: Urology | Admitting: Urology

## 2024-09-26 VITALS — BP 92/62 | HR 69 | Ht 68.0 in | Wt 183.0 lb

## 2024-09-26 DIAGNOSIS — N200 Calculus of kidney: Secondary | ICD-10-CM | POA: Diagnosis not present

## 2024-09-26 DIAGNOSIS — R310 Gross hematuria: Secondary | ICD-10-CM | POA: Diagnosis not present

## 2024-09-26 DIAGNOSIS — N400 Enlarged prostate without lower urinary tract symptoms: Secondary | ICD-10-CM | POA: Diagnosis not present

## 2024-09-26 DIAGNOSIS — N1330 Unspecified hydronephrosis: Secondary | ICD-10-CM

## 2024-09-26 DIAGNOSIS — N202 Calculus of kidney with calculus of ureter: Secondary | ICD-10-CM

## 2024-09-26 DIAGNOSIS — A4150 Gram-negative sepsis, unspecified: Secondary | ICD-10-CM | POA: Diagnosis not present

## 2024-09-26 DIAGNOSIS — R8271 Bacteriuria: Secondary | ICD-10-CM

## 2024-09-26 DIAGNOSIS — Z950 Presence of cardiac pacemaker: Secondary | ICD-10-CM | POA: Diagnosis not present

## 2024-09-26 DIAGNOSIS — N201 Calculus of ureter: Secondary | ICD-10-CM

## 2024-09-26 DIAGNOSIS — N390 Urinary tract infection, site not specified: Secondary | ICD-10-CM | POA: Diagnosis not present

## 2024-09-26 DIAGNOSIS — Z960 Presence of urogenital implants: Secondary | ICD-10-CM | POA: Diagnosis not present

## 2024-09-26 LAB — URINALYSIS, ROUTINE W REFLEX MICROSCOPIC
Specific Gravity, UA: 1.01 (ref 1.005–1.030)
Urobilinogen, Ur: 4 mg/dL — ABNORMAL HIGH (ref 0.2–1.0)
pH, UA: 8.5 — ABNORMAL HIGH (ref 5.0–7.5)

## 2024-09-26 LAB — MICROSCOPIC EXAMINATION: RBC, Urine: >30 /HPF — AB (ref 0–2)

## 2024-09-28 LAB — URINE CULTURE: Organism ID, Bacteria: NO GROWTH

## 2024-09-30 ENCOUNTER — Other Ambulatory Visit: Payer: Self-pay | Admitting: Urology

## 2024-09-30 DIAGNOSIS — N3000 Acute cystitis without hematuria: Secondary | ICD-10-CM

## 2024-09-30 MED ORDER — SULFAMETHOXAZOLE-TRIMETHOPRIM 800-160 MG PO TABS: 1.0000 | ORAL_TABLET | Freq: Two times a day (BID) | ORAL | 0 refills | Status: AC

## 2024-10-01 ENCOUNTER — Ambulatory Visit: Payer: Self-pay

## 2024-10-01 NOTE — Progress Notes (Addendum)
 [facility]  Date: 10/01/2024 Patient Name: Jordan [name] MRN: [id number] PCP: Arloa Elsie Counts Referring Provider: Ricky Alfrieda Chock, DO  HISTORY   [name] is a 83 y.o. right handed male who is seen today in consultation at the request of Doctor Tu for evaluation of parkinsonism. [name] is accompanied by his wife. History is obtained from the patient, family members, and review of prior records.   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  He has been seeing neurology at [facility], last seen 05/25/2024.  He was initially evaluated for parkinsonism in 2021 for right hand tremor and changes in gait with shuffling.  He has had multiple hospitalizations in the last year including urosepsis and has had progressive functional, cognitive and mobility decline.   He presents today with his wife for evaluation.  History obtained primarily from patient's wife and as patient is minimally engaged and minimally interactive during the visit.  Wife first noticed changes in gait starting in 2021 with balance impairment and a fall in a parking lot which resulted in a pelvic fracture.  He did not require surgery but did have a rehab stay and then went home.  In 2021 he was still ambulatory and not requiring assistive device.  Around this time he started to develop a tremor in his right hand.  He had an ischemic stroke in 2019 involving bilateral frontal lobes, parietal lobe and right cerebellum.  In 2021 he was evaluated by local neurologist for parkinsonism.  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  He was initially started on Sinemet  IR 1 tab 3 times daily but he had worsening orthostatic hypotension so was switched over to Sinemet  CR 3 times daily.  They did not notice significant improvement in gait or tremor after starting Sinemet .  Since 2021 wife has noticed softer speech more masked face and he has acted out his dreams in the past.  Over time he has had progressive gait impairment and cognitive changes.   In 2022 he started using a walker for ambulation. He has had progressive memory loss and difficulties in executive functioning.   In April, May and July 2025 he had hospitalizations for encephalopathy and urosepsis.  During his hospitalization  in in April he had hallucinations of animals and people while hospitalized and was started on Seroquel .  After his hospitalization in May he has been residing at a rehab facility.  He is no longer taking Seroquel  as this made him overly sedated.  Regarding his levodopa , his wife says that he developed dyskinesias, confusion and worsening hallucinations around July and medication was stopped.  At that time he was taking Sinemet  CR 1 tab 3 times daily and CR 50/200 at night.  He no longer has dyskinesias but she does notice that he is slower and stiffer since stopping levodopa .  Since May he has been essentially bedbound.  Physical therapy has tried to work with him at his facility but he is limited due to orthostatic hypotension, ability to follow commands and maintain concentration and wakefulness.  At current he sleeps over half of the day, he lost weight during his hospitalizations for  UTI and is currently using nutritional supplements.  He is eating 2 meals a day but only eating about half of his plate.  His wife says that he is very withdrawn, sleeping most of the day and less interactive.  He continues to have formed visual hallucinations but they are not bothersome and not causing agitation.  He does  have a rash and sore in his groin area.  He was recently started on Namenda in November.  He is currently on an Exelon  patch which seems to have helped with his hallucinations.   He has currently established care with the [facility] in [place] and has seen neurology there.  His wife has not been happy with his local neurology care and is interested in seeing a comprehensive movement disorder clinic [facility] with [facility].  She says that he is [facility] service-[facility] due to diagnosis of Parkinson's with exposure to agent orange.   PD History Diagnosed with PD: Evaluated for parkinsonism in 2021 Initial symptoms and laterality: Gait changes and right upper extremity tremor Progression of the disease/new sx's: Progressive gait impairment, dementia and functional decline Trial of levodopa : Yes, did not respond at Sinemet  CR 1 tab 3 times daily  Exposure to dopamine antagonists, insecticides, agent orange, manganese exposure: Was started on Seroquel  in 2025 which has been stopped Family history of PD or any other Parkinson disease: no  Motor Symptoms Wearing-off: no Tremor: right hand  Dyskinesias: yes Dystonia:  no Freezing: yes Myoclonus: yes Postural instability/falls: yes    Non-Motor Symptoms Dysphagia: some w thin liquids  Speech difficulty: softer and slower  Orthostasis: yes Depression/Anxiety: No Memory loss: He has had insidious onset of progressive cognitive decline Constipation: yes  Urinary retention, urgency or incontinence: Incontinence RBD: Yes he yells in his sleep and is very active, often kicks out and moves his arms Sleep issues: OSA and narcolepsy , takes off CPAP overnight Olfaction/Taste: He has lost his sense of smell Weight loss: yes Sialorrhea: no Balance problems / falls: yes initially, now he is wheelchair-bound Hallucinations: yes, formed visual hallucinations of animals and people, typically not disturbing to the patient Vision changes: no Sexual dysfunction:  no Hyper/hypohydrosis: no Pain: no   Social   He currently resides as a long-term resident in a rehab facility He is married and has 3 children 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 acetaminophen  (TYLENOL ) 1,000 mg  . atorvastatin  (LIPITOR) 20 mg, Daily  . b complex multivitamin (NEPHROCAPS) 1 mg capsule 1 capsule, Daily  . carbidopa -levodopa  (SINEMET  CR) 25-100 mg CR tablet 1 tablet, Oral, 3 times Daily before meals  . cephalexin  (KEFLEX ) 250 mg, Daily  . clotrimazole-betamethasone (LOTRISONE) 1-0.05 % cream Apply topically  . ergocalciferol , vitamin D2, 1,250 mcg (50,000 unit) capsule TAKE 1 CAPSULE BY MOUTH ONCE A WEEK FOR 90 DAYS  . magnesium  oxide 420 mg Tab 1 tablet, Daily  . melatonin 3 mg, Nightly  . memantine (NAMENDA) 5 mg  . metFORMIN  (GLUCOPHAGE ) 1,000 mg, 2 times Daily  . midodrine  (PROAMATINE ) 2.5 mg  . modafiniL  (PROVIGIL ) 200 mg, Daily  . nitroGLYcerin  (NITROSTAT ) 0.4 mg  . polyethylene glycol (MIRALAX ) 17 g, 2 times Daily  . rivaroxaban  (XARELTO ) 20 mg, Daily  . rivastigmine  (EXELON ) 13.3 mg/24 hour patch Place onto the  skin  . sennosides (SENOKOT) 8.6 mg  . SITagliptin phosphate (JANUVIA) 100 mg, Daily  . tamsulosin  (FLOMAX ) 0.4 mg, Daily      I performed a limited ROS due to patient's ability to interact. Positive and pertinent negative responses are documented in the HPI, and all other systems are negative  Social History   Socioeconomic History  . Marital status: Married  Tobacco Use  . Smoking status: Former    Types: Cigarettes  . Smokeless tobacco: Never  Substance and Sexual Activity  . Drug use: Never   Social Drivers of Health   Food Insecurity: No Food Insecurity (05/13/2024)   Received [facility]   Hunger Vital  Sign   . Within the past 12 months, you worried that your food would run out before you got the money to buy more.: Never true   . Within the past 12 months, the food you bought just didn't last and you didn't have money to get more.: Never true  Transportation Needs: No Transportation Needs (05/13/2024)   Received from [facility]   . In the past 12 months, has lack of transportation kept you from medical appointments or from getting medications?: No   . In the past 12 months, has lack of transportation kept you from meetings, work, or from getting things needed for daily living?: No  Social Connections: Unknown (05/13/2024)   Received from [facility]   Social Connection and Isolation Panel   . In a typical week, how many times do you talk on the phone with family, friends, or neighbors?: Three times a week   . How often do you get together with friends or relatives?: Three times a week   . How often do you attend church or religious services?: More than 4 times per year   . Do you belong to any clubs or organizations such as church groups, unions, fraternal or athletic groups, or school groups?: Yes  Housing Stability: Unknown (10/01/2024)   Housing Stability Vital Sign   . Homeless in the Last Year: No    PHYSICAL EXAM   Vitals:   10/01/24 1454  BP: 123/73 Comment: Patient unable to stand, to take the standing BP  BP Location: Right upper arm  Patient Position: Sitting  BP Cuff Size: Adult  Pulse: 72  Resp: 18  Temp: 37.2 C (99 F)  TempSrc: Oral  SpO2: 95%  Weight: 81.6 kg (180 lb) Comment: Weight of patient in the facility     General appearance -chronically ill-appearing older gentleman, eyes closed through most of the visit.  HEENT - Head Normocephalic, without obvious abnormality, atraumatic.   Chest - normal work of breathing without audible wheeze or coughing Heart - warm and well perfused Extremities - warm and well perfused and no  cyanosis, clubbing or peripheral edema   Neurological Examination:  Mental Status: Minimally interactive, eyes closed throughout most of the visit although able to open eyes and respond to questions when prompted.  Not oriented to year month or place.  Answered 2005, December and [facility] when asked orientation questions.  He is able to follow simple appendicular and midline commands. Actively hallucinating shapes on the door of the clinic room and figures in the room.   Cranial nerves: EOMI w/o nystagmus.  No vertical gaze limitation, facial sensation intact. Face symmetrical, hearing intact to voice, shoulder shrug good bilaterally, tongue protrudes midline, palate elevates symmetrically  Strength: Normal bulk. Full strength in upper extremities,  4/5 hip flexion strength bilaterally with maximal effort, 5/5 knee flexion extension bilaterally.  Sensory: intact to light touch in all four distal extremities.   Reflexes: Hypoactive reflexes throughout  Coordination: Finger-nose without evidence of ataxia.  Unable to complete heel-to-shin testing  Gait: He is nonambulatory. Supine in a transfer chair with significant padding.   Movement: Mild hypomimia and hypophonia Intermittent bilateral upper extremity distal resting tremor Bilateral upper extremity postural tremor and fine myoclonus in distal upper extremities Finger taps, hand movements, rapid alternating movements, foot stomps with moderate bradykinesia bilaterally Paratonia throughout No dyskinesia No dystonia  No tics No chorea No apraxia     Diagnostic Studies and Review of Records:  Prior records reviewed and summarized in the HPI.  No results found for this or any previous visit.  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  His symptoms started with gait changes and tremor in 2021 and progressed over time to include dementia and hallucinations and progressive gait impairment now being wheelchair-bound.  He was tried on Sinemet  but did not respond robustly and according to wife developed dyskinesias.  Over the last year he has had progressive decline in function, cognition, mobility and ability to interact and has had multiple hospitalizations.  His MRI shows significant global atrophy, encephalomalacia from prior strokes and atrophy in brainstem structures including midbrain pons as well as cerebellum.  Regarding his diagnosis, most likely multiple pathologies contributing to his current condition including alpha synucleinopathy such as PD, Alzheimer pathology and vascular insults.  He has nonmotor symptoms including anosmia, RBD and constipation that can be seen in alpha synucleopathies and experienced dyskinesias with levodopa . However, it is clear that PD is not the sole condition causing his decline. Given his significant  functional decline we had Lenin discussion with patient and his wife regarding role of hospice in symptomatic management and prioritizing quality of life. He has several features indicative of being appropriate for hospice including incresed sleep, decreased interaction with others, decreased appetite, and pressure sores (spouse describes grade 1 pressure ulcers). We discussed that it is very unlikely he will regain ambulatory status given his significant deconditioning.  With regards to hospice they are not ready to make that decision at this time.   # Parkinson's disease with psychosis # Dementia - mixed pathologies - Agree with Namenda and Exelon  patch - We discussed restarting Sinemet  CR 25/100 3 times daily to help with motor symptoms of PD (namely any extrapyramidal rigidity) - monitor for worsening hallucinations - Revisit hospice discussion at next visit - Return in 6 months for video visit   Patient seen and discussed with Dr.  Georgina who agrees with plan  Viktoria Eth MD PhD PGY 5 movement disorder fellow   Medications and Orders Requested Prescriptions   Signed Prescriptions Disp Refills  . carbidopa -levodopa  (SINEMET  CR) 25-100 mg CR tablet 90 tablet 11    Sig: Take 1 tablet by mouth 3 (three) times daily before meals    No orders of the defined types were placed in this encounter.   Patient Instructions Patient Instructions  Thank for coming in to see us  today.   There is most likely multiple things going on including Parkinson's disease, stroke and probably some Alzheimer pathology in the brain.   I recommend that we add Sinemet  (carbidopa -levodopa ) CR 25/100 mg 1 tablet three times per day with meals. We will watch out for hallucinations on this medication.   We discussed the option of a Hospice referral. Hospice will often use Haldol . Kary cannot have this medication. It will make his Parkinson's symptoms worse.    MOVE MORE - Today, Tomorrow, and  Forever FUNCTIONALLY today through a comprehensive interdisciplinary care approach that draws upon the best in lifestyle, medical, physical and surgical therapies. TRAINEES to be tomorrow's leaders in Movement Disorder care. RESEARCH EFFORTS to get rid of the burden of neurological diseases, forever.  Your Provider: Dr. Lamarr Georgina, MD  The [facility] is committed to providing you the best in comprehensive care.  Between clinic visits, we have a number of resources available to assist you. For routine medication refills, contact the [facility] nursing triage center at Tel: [phone] OPTION 2. If you have a medication or side-effect related question, please call the [facility] nursing triage center at [phone] OPTION 4. For support related to mental health, carepartner challenges, community resources, difficulty accessing health care, and disability or medical leave, please contact our Movement Disorder Center clinical social workers at [phone].  For financial concerns and prior authorization needs, please contact our financial care counselor at [phone] OPTION 3.  To non-urgently change your appointment, please call [facility]'s central scheduling hub at [phone]. To find a sooner appointment for an urgent care need that arises, please contact our Movement Disorders staff assistant to assist you at [phone].  To find out about support groups, outreach events, current clinical research opportunities and our quarterly newsletter, visit: [email or web address] or ask to speak with our social workers at your next visit.  To provide a brief update between visits, MyChart messaging is an ideal venue, as your health update becomes part of your clinical record.  Please also note that if you wish to more extensively discuss your care plan or results, our  experience is that an in-person visit is better.  If you don't feel comfortable waiting until your next appointment, please ask for an interim appointment with a Movement Disorder team care provider. Add-on slots are made available with Luke Brow, PA to accommodate these needs. Please contact our staff assistant to arrange at [facility]: [phone].      Follow Up: Return in about 6 months (around 04/01/2025) for Video.   This visit was coded based on medical decision making (MDM).   Choose Attestation:  Attestation Statement:   I personally saw and evaluated the patient, and participated in the management and treatment plan as documented in the resident/fellow note.  KATHRYN P MOORE, MD I personally saw and evaluated the patient and participated in the management and treatment plan as documented in the resident/fellow note.   Lamarr MYRTIS FREDRIK Georgina,  MD MSc Assistant Professor of Neurology 10/01/2024  [facility] [address] [place] [address] Phone [phone], fax [phone]  For patients and caregivers: You now have access to your medical records including your physician's documentation via [facility] MyChart. Please be aware that your physician's notes are primarily designed to (1) record information pertinent to your care for the future reference of your physician and (2) act as inter-professional communication between members of your medical team. You should rely on the After Visit Summary (AVS) provided to you at the end of your visit for information about your visit and instructions regarding next steps. This is also available to you in [facility]. If you have clarifications or corrections you would like made to your records, please send a message via MyChart. This will automatically be included in your medical record.

## 2024-10-01 NOTE — Telephone Encounter (Signed)
-----   Message from Garnette HERO Dahlstedt sent at 09/30/2024  7:02 PM EST ----- Please call wife--culture was neg but I sent in a sulfa pill for him to start 3 days before appt w/ me ----- Message ----- From: Interface, [facility] Lab Results In Sent: 09/26/2024   3:36 PM EST To: Garnette Shack, MD

## 2024-10-01 NOTE — Telephone Encounter (Signed)
 Spoke with pt wife, Burnard, in reference to ucx results and sulfa abx. Burnard voiced understanding.

## 2024-10-02 DIAGNOSIS — H40053 Ocular hypertension, bilateral: Secondary | ICD-10-CM | POA: Diagnosis not present

## 2024-10-02 DIAGNOSIS — Z961 Presence of intraocular lens: Secondary | ICD-10-CM | POA: Diagnosis not present

## 2024-10-02 DIAGNOSIS — H04123 Dry eye syndrome of bilateral lacrimal glands: Secondary | ICD-10-CM | POA: Diagnosis not present

## 2024-10-03 DIAGNOSIS — E785 Hyperlipidemia, unspecified: Secondary | ICD-10-CM | POA: Diagnosis not present

## 2024-10-03 DIAGNOSIS — G20A1 Parkinson's disease without dyskinesia, without mention of fluctuations: Secondary | ICD-10-CM | POA: Diagnosis not present

## 2024-10-03 DIAGNOSIS — M549 Dorsalgia, unspecified: Secondary | ICD-10-CM | POA: Diagnosis not present

## 2024-10-03 DIAGNOSIS — E1169 Type 2 diabetes mellitus with other specified complication: Secondary | ICD-10-CM | POA: Diagnosis not present

## 2024-10-03 DIAGNOSIS — N201 Calculus of ureter: Secondary | ICD-10-CM | POA: Diagnosis not present

## 2024-10-05 DIAGNOSIS — G20A1 Parkinson's disease without dyskinesia, without mention of fluctuations: Secondary | ICD-10-CM | POA: Diagnosis not present

## 2024-10-05 DIAGNOSIS — F0282 Dementia in other diseases classified elsewhere, unspecified severity, with psychotic disturbance: Secondary | ICD-10-CM | POA: Diagnosis not present

## 2024-10-05 DIAGNOSIS — E785 Hyperlipidemia, unspecified: Secondary | ICD-10-CM | POA: Diagnosis not present

## 2024-10-05 DIAGNOSIS — E1159 Type 2 diabetes mellitus with other circulatory complications: Secondary | ICD-10-CM | POA: Diagnosis not present

## 2024-10-05 DIAGNOSIS — Z7984 Long term (current) use of oral hypoglycemic drugs: Secondary | ICD-10-CM | POA: Diagnosis not present

## 2024-10-07 ENCOUNTER — Ambulatory Visit: Payer: Self-pay | Admitting: Urology

## 2024-10-09 ENCOUNTER — Encounter: Payer: Medicare (Managed Care) | Admitting: Physical Medicine and Rehabilitation

## 2024-10-09 NOTE — Progress Notes (Unsigned)
 Assessment: 1.  Large left renal pelvic stone.  14 mm, at last measurement in April of this year.    2.  2 left distal ureteral calculi on April CT scan.  No hydronephrosis.  He has been on tamsulosin  for 3 months.  Based on KUB today, I think that these are no longer present  3.  Gross hematuria, most likely related to problem #1 as well as stent placement  4.  BPH based on size criteria  5.  Most recently admitted for urosepsis with left ureteral calculi, stented.  Urine does have some bacteria in it today   Plan:    History of Present Illness:  Initially seen June 30 of this year for evaluation of a left renal calculus. . He does have a history of gross hematuria.  He was admitted to the hospital in March for failure to thrive with advanced Parkinson's disease with cognitive deficit.  The patient is in a skilled care facility at this time, at [name].  He is wheelchair confined.  He has been followed at [facility] for his left renal calculi as well as BPH and hematuria.  He currently has no complaints of left-sided flank pain.  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  CT scan from April 3 of this year also revealed 2 small left lower ureteral stones.  There was no evident hydronephrosis.  He passes most of his urine in a diaper.  He also has some fecal incontinence.  7.13.2025: Underwent urgent placement of left ureteral stent by Dr. Francisca for obstructing left distal ureteral stone as well as UTI/sepsis. Culture + for klebsiella res to macrobid/ampicillin  7.23.2025: Here today for follow-up.  Prior to his admission on the 12th of last month, he did have cognitive decline, perhaps at this point felt to be due to underlying urinary tract infection.  11.26.2025: Here for recheck.  Still has chronic hematuria.  He has had some improvement in his cognitive ability.  Has an appointment with the movement disorder clinic at [facility] in 6 days.  Has not been treated  for recent infection.  12.10.2025: Here for cysto/stent extraction   Past Medical History:  Past Medical History:  Diagnosis Date   Arthritis    R shoulder, bone spur   Arthritis    hands (03/25/2016)   Chronic diastolic heart failure (HCC)    Chronic lower back pain    Coronary heart disease    Dr Esmeralda Sharps   Dementia [facility])    Diverticulitis 12/2021   DVT (deep venous thrombosis) (HCC) 05/2022   left leg   H/O cardiovascular stress test    perhaps last one was 2009   Heart murmur    12/29/11 echo: mild MR, no AS, trivial TR   History of gout    Hyperlipidemia    Hypertension    saw Dr. HILARIO Sharps last earl;y- 2014, cardiac cath. last done ?2009, blocks seen didn't require any intervention at that point.   Narcolepsy    [name] 04-11-97; Mean Latency 1.79min, SOREM 2   OSA on CPAP    NPSG 12-13-98 AHI 22.7   Parkinson's disease (HCC)    Vascular   PONV (postoperative nausea and vomiting)    Presence of permanent cardiac pacemaker    REM sleep behavior disorder    Shortness of breath    with exertion    Stroke (HCC)    2021. Slight right sided weakness   Type II  diabetes mellitus [facility])     Past Surgical History:  Past Surgical History:  Procedure Laterality Date   CARDIAC CATHETERIZATION  2009?   CYSTOSCOPY W/ URETERAL STENT PLACEMENT Left 05/13/2024   Procedure: CYSTOSCOPY WITH URETERAL STENT INSERTION;  Surgeon: Francisca Redell BROCKS, MD;  Location: [facility] OR;  Service: Urology;  Laterality: Left;   EP IMPLANTABLE DEVICE N/A 03/25/2016   Procedure: Pacemaker Implant - Dual Chamber;  Surgeon: Danelle LELON Birmingham, MD;  Location: [facility] INVASIVE CV LAB;  Service: Cardiovascular;  Laterality: N/A;   FRACTURE SURGERY     HIP FRACTURE SURGERY     INSERT / REPLACE / REMOVE PACEMAKER  03/24/2016   INTRAMEDULLARY (IM) NAIL INTERTROCHANTERIC Left 02/08/2022   Procedure: INTRAMEDULLARY (IM) NAIL INTERTROCHANTRIC;  Surgeon: Addie Cordella Hamilton, MD;  Location: [facility] OR;  Service: Orthopedics;   Laterality: Left;   IR KYPHO LUMBAR INC FX REDUCE BONE BX UNI/BIL CANNULATION INC/IMAGING  12/29/2021   IR RADIOLOGIST EVAL & MGMT  12/15/2021   IR RADIOLOGY PERIPHERAL GUIDED IV START  07/14/2022   IR US  GUIDE VASC ACCESS RIGHT  07/15/2022   LEFT AND RIGHT HEART CATHETERIZATION WITH CORONARY ANGIOGRAM N/A 06/05/2014   Procedure: LEFT AND RIGHT HEART CATHETERIZATION WITH CORONARY ANGIOGRAM;  Surgeon: Victory LELON Claudene DOUGLAS, MD;  Location: [facility] CATH LAB;  Service: Cardiovascular;  Laterality: N/A;   NASAL FRACTURE SURGERY     SHOULDER ARTHROSCOPY WITH ROTATOR CUFF REPAIR AND SUBACROMIAL DECOMPRESSION Right 08/16/2013   Procedure: SHOULDER ARTHROSCOPY WITH ROTATOR CUFF REPAIR AND SUBACROMIAL DECOMPRESSION;  Surgeon: Eva Elsie Herring, MD;  Location: [facility] OR;  Service: Orthopedics;  Laterality: Right;  Right shoulder arthroscopy rotator cuff repair, subacromial decompression.   TONSILLECTOMY      Allergies:  Allergies  Allergen Reactions   Amantadines Other (See Comments)    Mental changes   Clonazepam  Other (See Comments)    Mental changes   Lisinopril  Other (See Comments)    Unknown reaction   Zoloft  [Sertraline ] Other (See Comments)    Mental changes    Family History:  Family History  Problem Relation Age of Onset   Sleep apnea Sister    Colon cancer Sister     Social History:  Social History   Tobacco Use   Smoking status: Former    Current packs/day: 0.00    Average packs/day: 2.5 packs/day for 6.0 years (15.0 ttl pk-yrs)    Types: Cigarettes    Start date: 06/04/1961    Quit date: 06/05/1967    Years since quitting: 57.3   Smokeless tobacco: Never  Vaping Use   Vaping status: Never Used  Substance Use Topics   Alcohol  use: Yes    Comment: rare   Drug use: No   Cystoscopy/Left ureteral stent extraction:    Results:   I have reviewed prior imaging--CT images from most recent hospitalization.  Skin to stone distance 8 cm, Hounsfield units 850.  Prostate volume  measurement approximately 50 mL.  The 2 stones visualized in the left distal ureter were quite small/narrow.  Recent urine culture reviewed  I/O catheterization was performed to collect urine  KUB from today reviewed.  Stent adequately positioned in the left abdominal area.  Stable left renal calculus.  I see no evidence of calculi along the stent.

## 2024-10-10 ENCOUNTER — Ambulatory Visit: Payer: Medicare (Managed Care) | Admitting: Urology

## 2024-10-10 VITALS — BP 95/56 | HR 75

## 2024-10-10 DIAGNOSIS — N202 Calculus of kidney with calculus of ureter: Secondary | ICD-10-CM | POA: Diagnosis not present

## 2024-10-10 DIAGNOSIS — N1330 Unspecified hydronephrosis: Secondary | ICD-10-CM

## 2024-10-10 DIAGNOSIS — N400 Enlarged prostate without lower urinary tract symptoms: Secondary | ICD-10-CM

## 2024-10-10 DIAGNOSIS — N200 Calculus of kidney: Secondary | ICD-10-CM

## 2024-10-10 DIAGNOSIS — N201 Calculus of ureter: Secondary | ICD-10-CM

## 2024-10-10 MED ORDER — CEFTRIAXONE SODIUM 1 G IJ SOLR
1.0000 g | Freq: Once | INTRAMUSCULAR | Status: AC
  Administered 2024-10-10: 1 g via INTRAMUSCULAR

## 2024-10-10 NOTE — Addendum Note (Signed)
 Addended by: ANN VELERIA [name] on: 10/10/2024 03:29 PM   Modules accepted: Orders

## 2024-10-10 NOTE — Addendum Note (Signed)
 Addended by: OBADIAH ROSELEE RAMAN on: 10/10/2024 03:12 PM   Modules accepted: Orders

## 2024-10-10 NOTE — Progress Notes (Signed)
° °  The injection site was cleaned and prepped with alcohol . A band aid applied after injection given.   Rocephin  injection  Medication: ceftriaxone  Dose: 1g Location: left outer thigh Lot: [id number] Exp: 05/2026  Patient tolerated well, no complications were noted  Performed by: Veleria, CMA

## 2024-10-11 DIAGNOSIS — R5381 Other malaise: Secondary | ICD-10-CM | POA: Diagnosis not present

## 2024-10-11 DIAGNOSIS — G20A1 Parkinson's disease without dyskinesia, without mention of fluctuations: Secondary | ICD-10-CM | POA: Diagnosis not present

## 2024-10-11 DIAGNOSIS — I110 Hypertensive heart disease with heart failure: Secondary | ICD-10-CM | POA: Diagnosis not present

## 2024-10-11 DIAGNOSIS — I5032 Chronic diastolic (congestive) heart failure: Secondary | ICD-10-CM | POA: Diagnosis not present

## 2024-10-12 DIAGNOSIS — R5381 Other malaise: Secondary | ICD-10-CM | POA: Diagnosis not present

## 2024-10-12 DIAGNOSIS — F0390 Unspecified dementia without behavioral disturbance: Secondary | ICD-10-CM | POA: Diagnosis not present

## 2024-10-12 DIAGNOSIS — G20A1 Parkinson's disease without dyskinesia, without mention of fluctuations: Secondary | ICD-10-CM | POA: Diagnosis not present

## 2024-10-12 DIAGNOSIS — R531 Weakness: Secondary | ICD-10-CM | POA: Diagnosis not present

## 2024-10-22 DIAGNOSIS — G20A1 Parkinson's disease without dyskinesia, without mention of fluctuations: Secondary | ICD-10-CM | POA: Diagnosis not present

## 2024-10-22 DIAGNOSIS — F0282 Dementia in other diseases classified elsewhere, unspecified severity, with psychotic disturbance: Secondary | ICD-10-CM | POA: Diagnosis not present

## 2024-10-22 DIAGNOSIS — E1159 Type 2 diabetes mellitus with other circulatory complications: Secondary | ICD-10-CM | POA: Diagnosis not present

## 2024-10-22 DIAGNOSIS — Z7189 Other specified counseling: Secondary | ICD-10-CM | POA: Diagnosis not present

## 2024-10-22 DIAGNOSIS — Z7984 Long term (current) use of oral hypoglycemic drugs: Secondary | ICD-10-CM | POA: Diagnosis not present

## 2024-10-29 ENCOUNTER — Ambulatory Visit (INDEPENDENT_AMBULATORY_CARE_PROVIDER_SITE_OTHER): Payer: Medicare (Managed Care)

## 2024-10-29 DIAGNOSIS — I495 Sick sinus syndrome: Secondary | ICD-10-CM | POA: Diagnosis not present

## 2024-10-30 ENCOUNTER — Ambulatory Visit: Payer: Self-pay | Admitting: Internal Medicine

## 2024-10-30 LAB — CUP PACEART REMOTE DEVICE CHECK
Battery Voltage: 35
Date Time Interrogation Session: 20251230100940
Implantable Lead Connection Status: 753985
Implantable Lead Connection Status: 753985
Implantable Lead Implant Date: 20170525
Implantable Lead Implant Date: 20170525
Implantable Lead Location: 753859
Implantable Lead Location: 753860
Implantable Lead Model: 377
Implantable Lead Model: 377
Implantable Lead Serial Number: 49470224
Implantable Lead Serial Number: 49485613
Implantable Pulse Generator Implant Date: 20170525
Pulse Gen Model: 394969
Pulse Gen Serial Number: 68798589

## 2024-10-31 NOTE — Progress Notes (Signed)
 Remote PPM Transmission

## 2024-11-04 ENCOUNTER — Encounter: Payer: Self-pay | Admitting: Hematology

## 2024-11-05 ENCOUNTER — Encounter: Payer: Self-pay | Admitting: Hematology

## 2024-11-12 ENCOUNTER — Encounter: Payer: Self-pay | Admitting: Adult Health

## 2024-11-12 ENCOUNTER — Encounter: Payer: Medicare (Managed Care) | Admitting: Adult Health

## 2024-12-05 ENCOUNTER — Encounter: Payer: Self-pay | Admitting: Hematology

## 2024-12-06 ENCOUNTER — Ambulatory Visit: Payer: Medicare (Managed Care) | Admitting: Neurology

## 2025-01-01 ENCOUNTER — Ambulatory Visit: Payer: Medicare (Managed Care) | Admitting: Neurology

## 2025-03-07 ENCOUNTER — Inpatient Hospital Stay: Payer: Medicare (Managed Care)

## 2025-03-07 ENCOUNTER — Inpatient Hospital Stay: Payer: Medicare (Managed Care) | Admitting: Hematology
